# Patient Record
Sex: Male | Born: 1940 | Race: White | Hispanic: No | Smoking: Never smoker
Health system: Southern US, Community
[De-identification: ages and names within clinical notes are randomized; demographics above are authoritative.]

## PROBLEM LIST (undated history)

## (undated) DIAGNOSIS — E663 Overweight: Secondary | ICD-10-CM

## (undated) DIAGNOSIS — M109 Gout, unspecified: Secondary | ICD-10-CM

## (undated) DIAGNOSIS — E538 Deficiency of other specified B group vitamins: Secondary | ICD-10-CM

## (undated) DIAGNOSIS — E1140 Type 2 diabetes mellitus with diabetic neuropathy, unspecified: Secondary | ICD-10-CM

## (undated) DIAGNOSIS — C801 Malignant (primary) neoplasm, unspecified: Secondary | ICD-10-CM

## (undated) DIAGNOSIS — E785 Hyperlipidemia, unspecified: Secondary | ICD-10-CM

## (undated) DIAGNOSIS — T8453XA Infection and inflammatory reaction due to internal right knee prosthesis, initial encounter: Secondary | ICD-10-CM

## (undated) DIAGNOSIS — T884XXA Failed or difficult intubation, initial encounter: Secondary | ICD-10-CM

## (undated) DIAGNOSIS — J45909 Unspecified asthma, uncomplicated: Secondary | ICD-10-CM

## (undated) DIAGNOSIS — I10 Essential (primary) hypertension: Secondary | ICD-10-CM

## (undated) DIAGNOSIS — Z96653 Presence of artificial knee joint, bilateral: Secondary | ICD-10-CM

## (undated) DIAGNOSIS — I509 Heart failure, unspecified: Secondary | ICD-10-CM

## (undated) DIAGNOSIS — D4959 Neoplasm of unspecified behavior of other genitourinary organ: Secondary | ICD-10-CM

## (undated) DIAGNOSIS — L111 Transient acantholytic dermatosis [Grover]: Secondary | ICD-10-CM

## (undated) DIAGNOSIS — Z7901 Long term (current) use of anticoagulants: Secondary | ICD-10-CM

## (undated) DIAGNOSIS — I5032 Chronic diastolic (congestive) heart failure: Secondary | ICD-10-CM

## (undated) DIAGNOSIS — I499 Cardiac arrhythmia, unspecified: Secondary | ICD-10-CM

## (undated) DIAGNOSIS — Z79899 Other long term (current) drug therapy: Secondary | ICD-10-CM

## (undated) DIAGNOSIS — G4700 Insomnia, unspecified: Secondary | ICD-10-CM

## (undated) DIAGNOSIS — I4819 Other persistent atrial fibrillation: Secondary | ICD-10-CM

## (undated) DIAGNOSIS — R011 Cardiac murmur, unspecified: Secondary | ICD-10-CM

## (undated) DIAGNOSIS — E1169 Type 2 diabetes mellitus with other specified complication: Secondary | ICD-10-CM

## (undated) DIAGNOSIS — I4891 Unspecified atrial fibrillation: Secondary | ICD-10-CM

## (undated) DIAGNOSIS — C44311 Basal cell carcinoma of skin of nose: Secondary | ICD-10-CM

## (undated) DIAGNOSIS — K635 Polyp of colon: Secondary | ICD-10-CM

## (undated) DIAGNOSIS — B356 Tinea cruris: Secondary | ICD-10-CM

## (undated) DIAGNOSIS — E1142 Type 2 diabetes mellitus with diabetic polyneuropathy: Secondary | ICD-10-CM

## (undated) DIAGNOSIS — E7800 Pure hypercholesterolemia, unspecified: Secondary | ICD-10-CM

## (undated) DIAGNOSIS — E119 Type 2 diabetes mellitus without complications: Secondary | ICD-10-CM

## (undated) HISTORY — DX: Unspecified asthma, uncomplicated: J45.909

## (undated) HISTORY — DX: Gout, unspecified: M10.9

## (undated) HISTORY — DX: Other long term (current) drug therapy: Z79.899

## (undated) HISTORY — DX: Basal cell carcinoma of skin of nose: C44.311

## (undated) HISTORY — DX: Deficiency of other specified B group vitamins: E53.8

## (undated) HISTORY — PX: APPENDECTOMY: SHX54

## (undated) HISTORY — DX: Heart failure, unspecified: I50.9

## (undated) HISTORY — DX: Cardiac murmur, unspecified: R01.1

## (undated) HISTORY — PX: CARDIAC ELECTROPHYSIOLOGY STUDY AND ABLATION: SHX1294

## (undated) HISTORY — DX: Hyperlipidemia, unspecified: E78.5

## (undated) HISTORY — DX: Malignant (primary) neoplasm, unspecified: C80.1

## (undated) HISTORY — DX: Chronic diastolic (congestive) heart failure: I50.32

## (undated) HISTORY — DX: Type 2 diabetes mellitus with diabetic neuropathy, unspecified: E11.40

## (undated) HISTORY — DX: Presence of artificial knee joint, bilateral: Z96.653

## (undated) HISTORY — DX: Overweight: E66.3

## (undated) HISTORY — DX: Essential (primary) hypertension: I10

## (undated) HISTORY — DX: Polyp of colon: K63.5

## (undated) HISTORY — DX: Pure hypercholesterolemia, unspecified: E78.00

## (undated) HISTORY — DX: Long term (current) use of anticoagulants: Z79.01

## (undated) HISTORY — DX: Type 2 diabetes mellitus without complications: E11.9

## (undated) HISTORY — DX: Insomnia, unspecified: G47.00

## (undated) HISTORY — PX: CHOLECYSTECTOMY: SHX55

## (undated) HISTORY — DX: Tinea cruris: B35.6

## (undated) HISTORY — DX: Infection and inflammatory reaction due to internal right knee prosthesis, initial encounter: T84.53XA

## (undated) HISTORY — PX: CARDIOVERSION: EP1203

## (undated) HISTORY — DX: Unspecified atrial fibrillation: I48.91

## (undated) HISTORY — DX: Type 2 diabetes mellitus with diabetic polyneuropathy: E11.42

## (undated) HISTORY — PX: REPLACEMENT TOTAL KNEE BILATERAL: SUR1225

## (undated) HISTORY — DX: Transient acantholytic dermatosis (grover): L11.1

## (undated) HISTORY — DX: Neoplasm of unspecified behavior of other genitourinary organ: D49.59

## (undated) HISTORY — DX: Other persistent atrial fibrillation: I48.19

## (undated) HISTORY — DX: Type 2 diabetes mellitus with other specified complication: E11.69

## (undated) HISTORY — PX: PROSTATECTOMY: SHX69

## (undated) NOTE — Procedures (Signed)
 Formatting of this note might be different from the original.  JOINT ASPIRATION PROCEDURE NOTE  Date: 09/03/2017 14:24 Patient Name: Charles Lloyd.      Date of Birth: [birth year] MRN: [id number]      PCP: Donnice SAUNDERS. Messier, MD  Pre-operative Diagnosis: right knee swelling  Post-operative Diagnosis: hemarthrosis    Indications: Diagnostic  Anesthesia: Lidocaine  1% without epinephrine    Procedure Details   Verbal consent was obtained for the procedure and risks/benefits were discussed (bleeding, infection, pain). The point of maximum fluctuance was identified at the lateral right knee joint and the area was marked. The skin prepped with betadine and lidocaine  without epi was used to numb skin.  2mL of lidocaine  were used to raise a small wheal.     A needle was advanced into the right knee joint without difficulty and a total of 30mL of grossly bloody fluid was removed and sent to the lab for analysis.  No corticosteroids were given.   Complications:  None; patient tolerated the procedure well. Instructed to contact medical advice if has signs of infection - erythema, more swelling, fevers, or red line on skin.  Electronically signed by:  Lamar Rolan Blunt, MD, 09/03/2017, 14:24 Attending: Dr. Dorise Sax   Cosigned by Garnette KATHEE Sax, MD at 09/03/2017  7:36 PM EDT Electronically signed by Lamar Rolan Blunt, MD at 09/03/2017  2:29 PM EDT Electronically signed by Garnette KATHEE Sax, MD at 09/03/2017  7:36 PM EDT  Associated attestation - Sax GORMAN Dorise, MD - 09/03/2017  7:36 PM EDT Formatting of this note might be different from the original. I was present for the entire procedure and assisted the resident, Dr. Blunt in the arthrocentesis of the patient's right knee.  Please refer to the completed procedure note by Dr. Blunt.

## (undated) NOTE — Progress Notes (Signed)
 Formatting of this note is different from the original. Images from the original note were not included.     [facility] OFF[facility] CONSULT                                                               Patient Name:  Charles Lloyd      DOB:  [birth year]  MEDICAL RECORD NUMBER [id number]    Contact Serial Number:  [id number]  PCP: Donnice SAUNDERS. Messier, MD                          Referring Pysician:  Messier  Reason for Consult:  Fobt+, IDA  SUBJECTIVE  History of Present Illness:  Ashford Clouse is a 5 y.o. male whom I was asked to see for ida, fobt+  He has diabetes, not on meds at present. Takes ibuprofen 6/day. No Nexium. No melena (nowOn iron for a few weeks. ) No abdominal pain, diarrhea, constipation.  No gerd. On Eliquis  bid. (off Coumadin).  No dysphagia.  No fh colon cancer.  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 Diabetes mellitus type II, controlled 01/06/2010  ? Tinea cruris 01/06/2010  ? Colon polyps 01/06/2010  ? Neoplasm of prostate, malignant 01/06/2010  ? Allergic rhinitis 01/06/2010  ? Hypercholesterolemia 01/06/2010  ? Hypertension 01/06/2010  ? Atrial fibrillation 01/04/2010  ? Cancer     Prostate  ? History of recent travel     [place]   Surgical History: Past Surgical History  Procedure Laterality Date  ? Prostatectomy  2001   Social History: History   Social History  ? Marital Status: Married    Spouse Name: N/A    Number of Children: N/A  ? Years of Education: N/A   Social History Main Topics  ? Smoking status: Never Smoker   ? Smokeless tobacco: None  ? Alcohol  Use: 14.0 oz/week    28 Not specified per week  ? Drug Use: No  ? Sexual Activity: None     Comment: Married   Other  Topics Concern  ? None   Social History Narrative   Marital Status - Married   Children - None listed   Occupation - No information given   Regular Exercise - Yes, walking   Family History: Family History  Problem Relation Age of Onset  ? Heart disease Other     family hx of late-onset cardiovascular disease  ? Cancer - Other Neg Hx     no family hx of heritable cancers  ? Colon cancer Neg Hx   ? Diabetes Mother    Current Medications: Current Outpatient Prescriptions  Medication Sig Dispense Refill  ? ALPRAZolam (XANAX) 0.5 MG tablet TAKE 1/2 TABLET BY MOUTH TWICE A DAY AS NEEDED FOR SLEEP OR ANXIETY 30 tablet 2  ? amlodipine -benazepril (LOTREL) 5-20 mg per capsule TAKE 1 CAPSULE TWICE A DAY 180 capsule 3  ? apixaban  (ELIQUIS ) 5 mg Tab tablet Take by mouth 2 (two) times daily.    ?  atorvastatin  (LIPITOR) 20 MG tablet TAKE 1 TABLET DAILY WITH EVENING MEAL 90 tablet 0  ? betamethasone dipropionate (DIPROLENE) 0.05 % cream Apply topically as needed. TO RASH AS DIRECTED    ? blood sugar diagnostic (GLUCOSE BLOOD) Strp Test 2x daily Dx: type 2 diabetes, uncontrolled fluctuating blood sugars 60 strip 5  ? blood-glucose meter kit Use as instructed 1 each 0  ? cetirizine (ZYRTEC) 10 MG tablet Take 1 tablet (10 mg total) by mouth daily as needed for Allergies (rash). 30 tablet 0  ? hydrochlorothiazide  (ORETIC ) 12.5 MG tablet TAKE 1 TABLET DAILY 90 tablet 0  ? mometasone (NASONEX) 50 mcg/Actuation nasal spray Use as directed twice daily.    ? montelukast  (SINGULAIR ) 10 mg tablet Take 10 mg by mouth nightly.    ? oxyCODONE -acetaminophen  (PERCOCET) 5-325 mg per tablet Take 1 tablet by mouth every 4 (four) hours as needed for Pain. 60 tablet 0  ? [DISCONTINUED] ASPIRIN ORAL Take 81 mg by mouth. 2 x weekly      No current facility-administered medications for this visit.   Allergies: No Known Allergies   Review of Systems:  Gastroenterology review of systems: As per history of present  illness. General review of systems is positive for feet swelling, joint pain,  The remainder of the 14 point review of systems is negative.  OBJECTIVE  Physical Exam: BP 151/83 mmHg  Pulse 87  Temp(Src) 97.6 F (36.4 C) (Oral)  Ht 5' 10 (1.778 m)  Wt 195 lb 3.2 oz (88.542 kg)  BMI 28.01 kg/m2 Wt Readings from Last 3 Encounters:  04/02/14 195 lb 3.2 oz (88.542 kg)  03/18/14 190 lb (86.183 kg)  02/01/14 175 lb (79.379 kg)   General Appearance:    Alert, cooperative, no distress, appears stated age  Head:    Normocephalic, without obvious abnormality, atraumatic  Eyes:    PERRL, conjunctiva/corneas clear, EOM's intact  Ears:    Normal external ear canals, both ears  Nose:   Septum midline, mucosa normal, no drainage or sinus tenderness  Throat:   Lips, mucosa, and tongue norma  Neck:   Supple, symmetrical, trachea midline, no adenopathy;    thyroid :  no enlargement/tenderness/nodules; no carotid   bruit or JVD  Back:     Symmetric, no curvature, ROM normal  Lungs:     Clear to auscultation bilaterally, respirations unlabored  Chest Wall:    No tenderness or deformity   Heart:    Regular rate and rhythm, S1 and S2 normal, no murmur  Abdomen:     Inspection:  No hernias   Soft, non-tender, bowel sounds active all four quadrants,    no masses, no organomegaly   Extremities:   Extremities normal, atraumatic, no cyanosis or edema  Pulses:   2+ and symmetric all extremities  Skin:   no rashes or lesions  Lymph nodes:   Cervical, supraclavicular, and axillary nodes normal  Neurologic:   Speech-normal, normal strength, sensation and reflexes throughout, gait-normal  Neuropsychiatric:   Well groomed, oriented X 3 with appropriate mood and affect     Labs and diagnostic tests:   I reviewed available electronic and paper records. This included current Epic and pre-Epic Hospital records as well as in the available electronic outpatient records Reviewed independently the images and  reports of available abdominal imaging   Recent Results (from the past 2016 hour(s))  Comprehensive Metabolic Profile(CMP)   Collection Time: 01/25/14  8:56 AM  Result Value Ref Range   Sodium 134 132 -  146 mEq/L   Potassium 4.3 3.5 - 5.5 mmol/L   Chloride 97 (L) 99 - 109 mmol/L   CO2 30 21 - 31 mEq/L   Anion Gap 7    UREA NITROGEN (BUN) 11 9 - 23 mg/dL   Creatinine 0.7 0.5 - 1.3 mg/dL   Glucose 877 (H) 70 - 106 mg/dL   Osmolality Calc 730 (L) 280 - 301 mOSm/kg   Calcium  9.3 8.7 - 10.4 mg/dL   eGFR - Other 878.91 fo/fpw/8.26   eGFR - African American 146.50 ml/min/1.73   Albumin , BCG Method 3.9 3.2 - 4.8 g/dL   Bilirubin, Total 1.0 0.3 - 1.2 mg/dL   Alkaline Phosphatase 103 45 - 158 U/L   AST/SGOT 31.0 0.0 - 47.0 U/L   ALT 31 10 - 59 U/L   Protein, Total 6.7 5.7 - 8.2 g/dL   A/G RATIO 8.60   Lipid Panel   Collection Time: 01/25/14  8:56 AM  Result Value Ref Range   Cholesterol 128 0 - 200 mg/dL   Triglycerides 61 0 - 249 mg/dL   HDL 43 40 - 92 mg/dl   Chol/HDL Ratio 3.0 (L) 4.0 - 6.7   LDL Calculated 73 50 - 130 mg/dL   VLDL Cholesterol (Calc) 12 mg/dl  LDL, Direct   Collection Time: 01/25/14  8:56 AM  Result Value Ref Range   LDL, Direct Assay 77 0 - 130 mg/dL  Hemoglobin J8r   Collection Time: 01/25/14  8:56 AM  Result Value Ref Range   Hemoglobin A1c 6.1 3.9 - 6.2 %   Mean Glucose 117.13   Microalbumin/Crea Ratio, Urine   Collection Time: 01/25/14  8:56 AM  Result Value Ref Range   Microalbumin, Urine 1.5 0.2 - 1.9 mg/dL   Creatinine, Urine 869.5 mg/dL   Microalbumin/Creatinine Ratio 11.5 0.1 - 30.0  Urinalysis (UA) W/ Microscopic   Collection Time: 01/25/14  8:56 AM  Result Value Ref Range   Color, UA Yellow    Appearance Clear    Glucose, UA Negative    Bilirubin, UA Negative    Ketones, UA Negative    Specific Gravity, Urine 1.017 1.005 - 1.03   pH, Urine 5 5.00 - 8.00   Protein, UA Negative Negative   Urobilinogen, UA 2.0 (A) 0.2 - 1.0   Nitrite,  UA Negative Negative   Blood, UA Negative    Leukocytes, UA Negative    WBC, UA <1 Males 0-2/   RBC, UA 1 Females 0-   Bacteria, UA none    Mucus Rare    Amorphous, UA none    Squam Epithel, UA none   CBC with Differential   Collection Time: 01/25/14  8:56 AM  Result Value Ref Range   Hemoglobin 12.1 (L) 14.0 - 18.0 g/dL   Hematocrit 63.4 (L) 59.9 - 52.0 %   Erythrocytes 4.61 4.60 - 6.10 M/uL   MCV 79.2 (L) 80.0 - 98.0 fL   MCH 26.2 (L) 27.5 - 33.0 pg   MCHC 33.2 31.0 - 36.0 g/dL   RDW 84.29 88.49 - 83.9 %   Platelets 235.00 150.00 - 450 K/uL   MPV 9.5 8.0 - 13.0 fL   WBC 3.91 (L) 4.50 - 10.80 K/uL   Neutrophils 55.0 37.0 - 80.0 %   Neutrophils, Absolute 2.15 1.60 - 7.70 #   Lymphocytes 20.7 20.0 - 45.0 %   Lymphocytes, Absolute 0.8 (L) 1.0 - 4.5 #   Monocytes 15.3 (H) 1.0 - 13.0 %   Monocytes, Absolute  0.6 0.1 - 1.0 #   Eosinophils 8 (H) 0 - 6 %   Eosinophils, Absolute 0.3 0.0 - 0.7 #   Basophils 1 0 - 2 %   Basophils, Absolute 0.0 0.0 - 0.3 #   Immature Granulocytes, Absolute 0.01 #   Immature Granulocytes 0.30 %   NRBC 0.00 0.00 - 0.01 #   nRBC, Absolute 0.00 0.00 - 0.20 %  Fecal Occult Blood Immuno Test   Collection Time: 02/02/14 12:12 PM  Result Value Ref Range   iFOB Positive (A) Negative  Bone Density & IVA Imaging (First Available Appt)   Collection Time: 03/15/14 12:00 AM  Result Value Ref Range   Z Score Hip     T Score Hip    CBC with Differential   Collection Time: 03/15/14  9:43 AM  Result Value Ref Range   Hemoglobin 12.9 (L) 14.0 - 18.0 g/dL   Hematocrit 61.8 (L) 59.9 - 52.0 %   Erythrocytes 4.56 (L) 4.60 - 6.10 M/uL   MCV 83.6 80.0 - 98.0 fL   MCH 28.3 27.5 - 33.0 pg   MCHC 33.9 31.0 - 36.0 g/dL   RDW 84.09 88.49 - 83.9 %   Platelets 241.00 150.00 - 450 K/uL   MPV 9.8 8.0 - 13.0 fL   WBC 6.25 4.50 - 10.80 K/uL   Neutrophils 52.7 37.0 - 80.0 %   Neutrophils, Absolute 3.30 1.60 - 7.70 #   Lymphocytes 30.4 20.0 - 45.0 %   Lymphocytes,  Absolute 1.9 1.0 - 4.5 #   Monocytes 12.5 1.0 - 13.0 %   Monocytes, Absolute 0.8 0.1 - 1.0 #   Eosinophils 3 0 - 6 %   Eosinophils, Absolute 0.2 0.0 - 0.7 #   Basophils 1 0 - 2 %   Basophils, Absolute 0.1 0.0 - 0.3 #   Immature Granulocytes, Absolute 0.05 #   Immature Granulocytes 0.80 %   NRBC 0.00 0.00 - 0.01 #   nRBC, Absolute 0.00 0.00 - 0.20 %  Iron & Total Iron Binding Capacity(TIBC)   Collection Time: 03/15/14  9:43 AM  Result Value Ref Range   Iron 55 50 - 175 ug/dL   Iron Saturation 14 (L) 20 - 55 %   Iron Binding Capacity 405 250 - 450 ug/dL    Results for orders placed in visit on 09/24/13  CT Abdomen with/without contrast   Narrative CT SCAN OF THE ABDOMEN WITH AND WITHOUT IV CONTRAST:  COMPARISON: None.   TECHNIQUE: Helically acquired images were obtained from the lung bases through the iliac crests with and without IV contrast administration.  CONTRAST:  100 mL Isovue  300, no reaction.   FINDINGS:  Lung bases clear. Numerous subcentimeter circumscribed hypodense lesions scattered throughout both lobes of the liver. The largest in the lateral segment of left lobe measuring 1.3 x 1.0 cm. None of these demonstrate enhancement. Findings consistent with benign intrahepatic cyst. 7 mm left midpole renal cyst. Solid organs otherwise unremarkable. No free fluid or adenopathy. Cholelithiasis. Mild gallbladder wall thickening. No lytic or blastic lesions. There are a few enlarged porta hepatic lymph nodes. None are pathologic by size criteria.   Impression IMPRESSION:   MULTIPLE BENIGN-APPEARING INTRAHEPATIC CYSTS.  CHOLELITHIASIS WITH GALLBLADDER WALL THICKENING. RECOMMEND RIGHT UPPER QUADRANT ULTRASOUND FOR FURTHER CHARACTERIZATION.  FEW ENLARGED PORTA HEPATIC LYMPH NODES. NON PATHOLOGIC BY SIZE CRITERIA.  Edited by: Gustav Pyles Date: 09/24/2013 10:59 AM  Dictated By: Prentice JINNY Hurst, MD 09/24/2013 10:52 AM  Electronically Signed by:  Prentice JINNY Hurst, MD  09/24/2013 11:16 AM   N Results for orders placed in visit on 11/25/13  Ultrasound Abdomen RUQ (liver, gall bladder)   Narrative RIGHT UPPER QUADRANT ULTRASOUND:  COMPARISON:  CT of abdomen 09/24/2013  Gallstones.  FINDINGS:  Visualized portions aorta, pancreas, inferior vena cava unremarkable other than atherosclerotic change abdominal aorta.  19 cm enlarged echogenic liver consistent with hepatic steatosis or fibrosis. Hypoechoic areas in the liver RIGHT and LEFT lobes, largest approximately 1.4 likely represent benign cysts.  Multiple gallstones. 4 mm thickened gallbladder wall. No direct tenderness.  5 mm common bile duct without dilated hepatic ducts.  12.5 cm RIGHT kidney.    Impression IMPRESSION:  HEPATOMEGALY WITH UNDERLYING HEPATOCELLULAR DISEASE.  GALLSTONES WITH THICKENED GALLBLADDER WALL.  Dictated By: Norleen VEAR Taft Mickey, MD 11/25/2013 9:49 AM  Electronically Signed by: Norleen VEAR Taft Mickey, MD 11/25/2013 9:51 AM   Encounter Diagnoses  Name Primary?  ? Fecal occult blood test positive Yes  ? Colon polyps   ? Iron deficiency anemia due to chronic blood loss   ? Hematest positive stools   ? Neoplasm of prostate, malignant   ? Atrial fibrillation with controlled ventricular response    ASSESSMENT & PLAN   1. Fecal occult blood test positive   2. Colon polyps   3. Iron deficiency anemia due to chronic blood loss   4. Hematest positive stools   5. Neoplasm of prostate, malignant   6. Atrial fibrillation with controlled ventricular response   patient with IDA, fobt+, hx polyps on Eliquis  for A-fib and hx of prostate radiation Hx polyps overdue for screening, advanced adenoma 2007  Orders Placed This Encounter  Procedures  ? EGD  ? AMB Colonoscopy Diagnostic  plan Stop nsaids (ibuprofen) Egd/colon-2 day moviprep , last dose eliquis  morning before  I recommend EGD with possible biopsy, sclerotherapy, bicap coagulation, dilatation.  Risks, benefits and  alternatives of EGD were discussed with this patient.   Specifically discussed were risks of bleeding, infection, perforation, medication reaction, aspiration. The patient understands and agrees to the procedure.I recommend colonoscopy with possible biopsy, polypectomy, sclerotherapy, bicap coagulation .  Risks, benefits and alternatives of the colonoscopy were discussed with this patient.  This includes risk of bleeding, infection, medication reaction, perforation, small miss rate.  Risks of the prep were also discussed including risk of dehydration, renal insufficiency and electrolyte abnormalities, such as hyponatremia and hypokalemia.  The patient indicates understanding and agrees to the procedure.  Gilmore Fam, MD [facility]   04/02/2014 10:59  No future appointments.   cc: Donnice SAUNDERS. Messier, MD   Portions of this note may be dictated using Dragon natural speaking voice recognition software. Variances in spelling and vocabulary are possible and unintentional. Please notify dino if any discrepancies are noted  [id number] minutes   Electronically signed by Gilmore VEAR Fam, MD at 04/02/2014 11:02 AM EST

## (undated) NOTE — ED Provider Notes (Signed)
 Formatting of this note is different from the original. eMERGENCY dEPARTMENT eNCOUnter    CHIEF COMPLAINT   Chief Complaint  Patient presents with  ? Leg Pain    pain to right knee, calf onset yesterday. hx of infection to knee s/p surgery in February. swollen, mild redness to lower calf   HPI   Charles Lloyd. is a 59 y.o. male who presents to the emergency department with complaints of right knee and calf pain that started 4 days ago.  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  They removed hardware and he was on antibiotics through his IV for 6 weeks.  He is currently still on oral antibiotics and is followed by infectious disease.  He states that he noticed this morning that he had some swelling and was not able to put any weight on his leg.  He is concerned that his infection may be back.  He denies any fevers or chills.  He denies any history of blood clots.  No chest pain or shortness of breath.  Patient is on Eliquis  currently for AFib.  He states that he called his infectious disease doctor and they told him to come into the ER for evaluation as they are also concerned about possible infection.  No injuries to his knee or leg.  PAST MEDICAL HISTORY   Past Medical History:  Diagnosis Date  ? Anemia   ? Arthritis    KNEES  ? Atrial fibrillation (HCC) 01/04/2010  ? CHF (congestive heart failure) (HCC)    EF40% on echo 2015  ? Chronic anticoagulation   ? Complication of anesthesia    DIFFICULT INTUB. PER PT. IN 2001, NO CARD GIVEN TO PT., STATES NO PROBLEMS WITH ANESTH. SINCE THEN  ? 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 Hypercholesterolemia 01/06/2010  ? Hypertension 01/06/2010  ? Neuropathy    FEET  ? Prostate cancer [facility]) 2001   surgery then RT  ? T2DM (type 2 diabetes mellitus) (HCC)    SURGICAL HISTORY   Past Surgical History:   Procedure Laterality Date  ? CARDIAC ELECTROPHYSIOLOGY MAPPING AND ABLATION  2015   Dr. Britta at [facility]  ? CARDIOVERSION  2017  ? CARPAL TUNNEL RELEASE Right 07/02/2016  ? CATARACT EXTRACTION Left   ? COLONOSCOPY W/ POLYPECTOMY  03/2014  ? KNEE SURGERY     x2  ? LAPAROSCOPIC CHOLECYSTECTOMY  2016  ? PROSTATECTOMY  2001  ? TOTAL KNEE ARTHROPLASTY Left 2017   MEDICATIONS   No current facility-administered medications for this encounter.    Current Outpatient Medications  Medication Sig Dispense Refill  ? ACCU-CHEK AVIVA PLUS TEST STRP Strp Use to test blood sugars once daily as directed Dx:E11.65 100 each 1  ? atorvastatin  (LIPITOR) 20 MG tablet TAKE 1 TABLET DAILY WITH EVENING MEAL 90 tablet 0  ? cyanocobalamin  (VITAMIN B-12) 1000 MCG tablet Take 1 tablet (1,000 mcg total) by mouth daily. 30 tablet 11  ? diltiazem  (CARDIZEM  CD) 360 MG 24 hr capsule Take 1 capsule (360 mg total) by mouth daily. 30 capsule 0  ? ELIQUIS  5 mg Tab tablet TAKE 1 TABLET TWICE A DAY 180 tablet 0  ? furosemide  (LASIX ) 20 MG tablet TAKE 1 TABLET DAILY 90 tablet 1  ? gabapentin  (NEURONTIN ) 300 MG capsule Take 600 mg by mouth nightly.    ?  KLOR-CON  M20 20 mEq tablet TAKE 1 TABLET (20 MEQ TOTAL) BY MOUTH DAILY. 90 tablet 1  ? lisinopril  (PRINIVIL ,ZESTRIL ) 10 MG tablet Take 1 tablet (10 mg total) by mouth daily.    ? metFORMIN  (GLUCOPHAGE ) 500 MG tablet Take 1 tablet (500 mg total) by mouth 2 (two) times daily with meals. 180 tablet 0  ? metoprolol  tartrate (LOPRESSOR ) 50 MG tablet Take 2 tablets (100 mg total) by mouth 2 (two) times daily. 180 tablet 3  ? mometasone (NASONEX) 50 mcg/Actuation nasal spray Use as directed twice daily.    ? montelukast  (SINGULAIR ) 10 mg tablet Take 10 mg by mouth nightly.     ALLERGIES   No Known Allergies  FAMILY HISTORY   Family History  Problem Relation Age of Onset  ? Diabetes Mother   ? Lung cancer Mother   ? Lymphoma Father   ? Heart disease Other        family hx of  late-onset cardiovascular disease  ? Cancer - Other Neg Hx        no family hx of heritable cancers  ? Colon cancer Neg Hx    SOCIAL HISTORY   Social History   Socioeconomic History  ? Marital status: Married    Spouse name: None  ? Number of children: None  ? Years of education: None  ? Highest education level: None  Occupational History  ? None  Social Needs  ? Financial resource strain: None  ? Food insecurity:    Worry: None    Inability: None  ? Transportation needs:    Medical: None    Non-medical: None  Tobacco Use  ? Smoking status: Never Smoker  ? Smokeless tobacco: Never Used  Substance and Sexual Activity  ? Alcohol  use: Yes    Comment: 12 weekly  ? Drug use: No  ? Sexual activity: None    Comment: Married  Lifestyle  ? Physical activity:    Days per week: None    Minutes per session: None  ? Stress: None  Relationships  ? Social connections:    Talks on phone: None    Gets together: None    Attends religious service: None    Active member of club or organization: None    Attends meetings of clubs or organizations: None    Relationship status: None  ? Intimate partner violence:    Fear of current or ex partner: None    Emotionally abused: None    Physically abused: None    Forced sexual activity: None  Other Topics Concern  ? None  Social History Narrative   Marital Status - Married   Children - None listed   Occupation - No information given   Regular Exercise - Yes, walking   Originally from [place], [place]   REVIEW OF SYSTEMS   Constitutional:  Denies fever, chills.  Denies generalized weakness.  Eyes:  Denies photophobia.  Denies blurred vision.  HENT:  Denies sore throat.  Denies ear pain.  Neck: Denies neck pain or stiffness. Respiratory:  Denies cough.  Denies shortness of breath.  Denies hemoptysis. Cardiovascular:  Denies chest pain.  Denies palpitations.  GI:  Denies abdominal pain.  Denies nausea.  Denies vomiting.  Denies  diarrhea.  Musculoskeletal:  See HPI Skin:  Denies rash.  Neurologic:  Denies focal weakness or sensory changes.  Denies headache. Lymphatic:  Denies swollen glands.   See HPI for further details.   PHYSICAL EXAM   VITAL SIGNS ED Triage  Vitals [09/03/17 1015]  Temp 97.7 F (36.5 C)  Temp src   Pulse 86  Heart Rate Source   Resp 18  BP 120/76  BP Location   BP Method   SpO2 100 %  O2 Device / Respiratory Support   O2 Flow Rate (L/min)   Pain Assessment 0-10  Pain Score 7   BP 124/8   Pulse 82   Temp 97.7 F (36.5 C)   Resp 16   SpO2 100%  Constitutional:  Well developed, well nourished, no acute distress, non-toxic appearance. Eyes:  PERRL, EOMI, conjunctiva normal, no discharge.  HENT: Normocephalic, atraumatic, nose normal.  Ears clear bilaterally, normal TM?s.  Normal oropharynx, no erythema, no tonsilar swelling, plaques or exudates. Neck:  Normal inspection, normal range of motion, supple, no meningismus, no lymphadenopathy. Respiratory:  Normal breath sounds.  No respiratory distress.  No wheezing.  No chest tenderness. Cardiovascular:  Normal heart rate, normal rhythm.  No murmurs, no rubs, no gallops. Abdomen: Soft, nontender, nondistended.  Normoactive bowel sounds present. Extremities:  Intact distal pulses, no edema, no cyanosis. Good range of motion in all major joints. No tenderness to palpation or major deformities noted.  Swelling noted to the right knee Back: No midline or CVA tenderness.  Skin:  Warm, dry, no erythema, no rash.  Neurologic:  Awake and alert, gross normal cognition.  No focal deficits noted.  CN II-XII grossly intact.  GCS=15.  RADIOLOGY   X-ray Knee Right Ap And Lat  Result Date: 09/03/2017 TWO VIEW RIGHT KNEE: COMPARISON: No comparison. FINDINGS: Status post right knee total arthroplasty. No hardware failure. No periprosthetic fractures. Joint spaces preserved. Prepatellar soft tissue swelling. Joint effusion. Diffuse vascular  calcification.   IMPRESSION: LARGE JOINT EFFUSION WITHOUT RADIOGRAPHIC FINDINGS OF AN ACUTE OSSEOUS ABNORMALITY. Dictated By: Debby RAMAN Dziedzic MD 09/03/2017 13:09  Electronically Signed by: Debby RAMAN Dziedzic MD 09/03/2017 13:10  Ultrasound Duplex Leg Right Venous  Result Date: 09/03/2017 VENOUS DUPLEX RIGHT LOWER EXTREMITY: COMPARISON: Prior studies HISTORY: Pain FINDINGS: Longitudinal and transverse images of the deep venous system were performed utilizing real-time ultrasound, color Doppler and duplex Doppler. There is normal compressibility, phasicity, augmentation and color flow.   IMPRESSION: NO EVIDENCE OF RIGHT LOWER EXTREMITY DEEP VENOUS THROMBOSIS.    Dictated By: Redell MARLA Holstein, MD 09/03/2017 11:52  Electronically Signed by: Redell MARLA Holstein, MD 09/03/2017 11:52   LABS Results for orders placed or performed during the hospital encounter of 09/03/17  CBC with Differential  Result Value Ref Range   WBC Count 5.6 4.0 - 10.0 K/uL   RBC Count 4.55 (L) 4.63 - 6.08 M/uL   Hemoglobin 10.6 (L) 13.7 - 17.5 g/dL   Hematocrit 65.8 (L) 59.8 - 51.0 %   MCV 74.9 (L) 79.0 - 92.2 fL   MCH 23.3 (L) 25.6 - 32.2 pg   MCHC 31.1 (L) 32.2 - 36.5 g/dL   RDW 80.5 (H) 88.3 - 85.5 %   Platelets 310 163 - 369 K/uL   MPV 9.7 9.4 - 12.4 fL   Abs NRBC 0.01 0.00 - 0.01 K/uL   Neutrophils 49.6 %   Lymphs 25.9 %   Monocytes 14.2 %   Eosinophils 8.5 %   Basophils 1.4 %   Immature Grans 0.4 %   Nucleated RBC 0.0 0.0 - 0.2 per 100 WBC's   Abs Neutrophils 2.75 1.60 - 6.10 K/uL   Abs Lymphs 1.44 1.20 - 3.70 K/uL   Abs Monos 0.79 0.20 - 0.80 K/uL  Abs EOS 0.47 0.00 - 0.50 K/uL   Abs Baso 0.08 0.00 - 0.10 K/uL   Abs Immature Grans 0.02 0.00 - 0.03 K/uL   ANC (Auto) 2.75 K/uL   Diff Type AUTO   Basic Metabolic Profile (BMP): Glucose, BUN, CO2-, Na+, K+, Cl-, Ca+, SCr, Anion Gap  Result Value Ref Range   Sodium 139 136 - 145 mmol/L   Potassium 3.9 3.5 - 5.1 mmol/L   Chloride 102 98 - 107 mmol/L   CO2 26 21  - 32 mmol/L   Anion Gap 11    Creatinine 1.09 0.70 - 1.30 mg/dL   GFR, Estimated >39 fO/fpw/8.26f7   BUN 17 7 - 18 mg/dL   Glucose 838 (H) 74 - 106 mg/dL   Calcium  9.3 8.5 - 10.1 mg/dL  C Reactive Protein  Result Value Ref Range   CRP 5.3 (H) 0.0 - 0.9 mg/dL  Morphology  Result Value Ref Range   RBC Morphology Present (A) Normal   Microcytic Moderate    Burr Cells Slight    Ovalocytes Slight    Teardrop Slight    PROCEDURES   Please refer to Dr. Donita note for details  ED COURSE & MEDICAL DECISION MAKING   Patient presents to the emergency department with right knee pain and swelling.  Patient is concerned about infection he.  He has afebrile.  CBC is showing a normal white blood cell count.  CRP is elevated at 0.3.  Sed rate is currently pending.  The option duplex is negative for DVT.  I called and spoke with patient's infectious disease doctor who recommended x-ray or CT to look for effusion.  X-ray was obtained showing a large effusion.  I called and spoke with his orthopedic doctor as the patient states he wanted a tap of the knee and to have the fluid cultured and the cell count.  Orthopedic doctor recommended that we tapped in the for and send the fluid off and have him follow up in his office tomorrow morning.  Patient is already on antibiotics.  The do not recommend changing his antibiotics at this time.  Patient is very well-appearing and safe for discharge at this time.  Follow up with Orthopedics 1st thing tomorrow.  Patient is currently stable and not in any acute distress and will be discharged home in good condition.  Patient is instructed to return to the emergency department for any other problems.  FINAL IMPRESSION   Final diagnoses:  Effusion of right knee   Portions of this note may be dictated using voice recognition software.  Variances in spelling and vocabulary are possible and unintentional.  Not all errors are caught/corrected.  Please notify the dino if  any discrepancies are noted or if the meaning of any statement is not clear.   Jordan A. Jenita, PA-C 09/03/17 1559   Garnette KATHEE Sax, MD 09/03/17 1937  Cosigned by Garnette KATHEE Sax, MD at 09/03/2017  7:37 PM EDT Electronically signed by Jordan A. Jenita, PA-C at 09/03/2017  3:59 PM EDT Electronically signed by Garnette KATHEE Sax, MD at 09/03/2017  7:37 PM EDT  Associated attestation - Sax GORMAN Pride, MD - 09/03/2017  7:37 PM EDT Formatting of this note might be different from the original. Please credit Jordan Britton, PA-C with level of service and credit me for the procedure of knee arthrocentesis.

## (undated) NOTE — ED Provider Notes (Signed)
 Formatting of this note is different from the original. eMERGENCY dEPARTMENT eNCOUnter    CHIEF COMPLAINT   Chief Complaint  Patient presents with  ? Leg Swelling    Right leg swelling and pain worsening the last few days. Patient recently seen for the same x 2 weeks ago and fluis drawn off the knee at ortho  ? Leg Pain   HPI   Charles Lloyd. is a 21 y.o. male who presents right leg swelling pain it has been ongoing for over a month patient has been seen multiple times for this in the ER and had fluid drained off of his right knee as well as his infectious disease doctor as well as his orthopedic surgeon had repeat fluid drained off of his right knee.  Patient on Eliquis  for AFib.  Infection was ruled out however patient apparently has had prior infection of the knee and is still on antibiotics.  He states that he has been told to wear Ted hose however he states that he felt like it it made his knee swelling were so he stopped wearing them.  Denies focal numbness weakness or tingling denies any trauma denies fevers or chills denies any rashes.  PAST MEDICAL HISTORY   Past Medical History:  Diagnosis Date  ? Anemia   ? Arthritis    KNEES  ? Atrial fibrillation (HCC) 01/04/2010  ? CHF (congestive heart failure) (HCC)    EF40% on echo 2015  ? Chronic anticoagulation   ? Complication of anesthesia    DIFFICULT INTUB. PER PT. IN 2001, NO CARD GIVEN TO PT., STATES NO PROBLEMS WITH ANESTH. SINCE THEN  ? 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 Hypercholesterolemia 01/06/2010  ? Hypertension 01/06/2010  ? Neuropathy    FEET  ? Prostate cancer [facility]) 2001   surgery then RT  ? T2DM (type 2 diabetes mellitus) (HCC)    SURGICAL HISTORY   Past Surgical History:  Procedure Laterality Date  ? CARDIAC ELECTROPHYSIOLOGY MAPPING AND ABLATION  2015   Dr. Britta at [facility]  ? CARDIOVERSION  2017  ? CARPAL TUNNEL RELEASE  Right 07/02/2016  ? CATARACT EXTRACTION Left   ? COLONOSCOPY W/ POLYPECTOMY  03/2014  ? KNEE SURGERY     x2  ? LAPAROSCOPIC CHOLECYSTECTOMY  2016  ? PROSTATECTOMY  2001  ? TOTAL KNEE ARTHROPLASTY Left 2017   CURRENT MEDICATIONS   No current facility-administered medications for this encounter.   Current Outpatient Medications:  ?  ACCU-CHEK AVIVA PLUS TEST STRP Strp, Use to test blood sugars once daily as directed Dx:E11.65, Disp: 100 each, Rfl: 1 ?  atorvastatin  (LIPITOR) 20 MG tablet, TAKE 1 TABLET DAILY WITH EVENING MEAL, Disp: 90 tablet, Rfl: 0 ?  cyanocobalamin  (VITAMIN B-12) 1000 MCG tablet, Take 1 tablet (1,000 mcg total) by mouth daily., Disp: 30 tablet, Rfl: 11 ?  diltiazem  (CARDIZEM  CD) 360 MG 24 hr capsule, Take 1 capsule (360 mg total) by mouth daily., Disp: 30 capsule, Rfl: 0 ?  ELIQUIS  5 mg Tab tablet, TAKE 1 TABLET TWICE A DAY, Disp: 180 tablet, Rfl: 0 ?  furosemide  (LASIX ) 20 MG tablet, TAKE 1 TABLET DAILY, Disp: 90 tablet, Rfl: 1 ?  gabapentin  (NEURONTIN ) 300 MG capsule, Take 600 mg by mouth nightly., Disp: , Rfl:  ?  KLOR-CON  M20 20 mEq tablet, TAKE 1 TABLET (20 MEQ TOTAL) BY MOUTH DAILY., Disp: 90 tablet, Rfl: 1 ?  lisinopril  (PRINIVIL ,ZESTRIL ) 10 MG tablet, Take 1 tablet (10 mg total) by mouth daily., Disp: , Rfl:  ?  metFORMIN  (GLUCOPHAGE ) 500 MG tablet, Take 1 tablet (500 mg total) by mouth 2 (two) times daily with meals., Disp: 180 tablet, Rfl: 0 ?  metoprolol  tartrate (LOPRESSOR ) 50 MG tablet, Take 2 tablets (100 mg total) by mouth 2 (two) times daily., Disp: 180 tablet, Rfl: 3 ?  mometasone (NASONEX) 50 mcg/Actuation nasal spray, Use as directed twice daily., Disp: , Rfl:  ?  montelukast  (SINGULAIR ) 10 mg tablet, Take 10 mg by mouth nightly., Disp: , Rfl:   ALLERGIES   No Known Allergies  FAMILY HISTORY   Family History  Problem Relation Age of Onset  ? Diabetes Mother   ? Lung cancer Mother   ? Lymphoma Father   ? Heart disease Other        family hx of  late-onset cardiovascular disease  ? Cancer - Other Neg Hx        no family hx of heritable cancers  ? Colon cancer Neg Hx    SOCIAL HISTORY   Social History   Socioeconomic History  ? Marital status: Married    Spouse name: None  ? Number of children: None  ? Years of education: None  ? Highest education level: None  Occupational History  ? None  Social Needs  ? Financial resource strain: None  ? Food insecurity:    Worry: None    Inability: None  ? Transportation needs:    Medical: None    Non-medical: None  Tobacco Use  ? Smoking status: Never Smoker  ? Smokeless tobacco: Never Used  Substance and Sexual Activity  ? Alcohol  use: Yes    Comment: 12 weekly  ? Drug use: No  ? Sexual activity: None    Comment: Married  Lifestyle  ? Physical activity:    Days per week: None    Minutes per session: None  ? Stress: None  Relationships  ? Social connections:    Talks on phone: None    Gets together: None    Attends religious service: None    Active member of club or organization: None    Attends meetings of clubs or organizations: None    Relationship status: None  ? Intimate partner violence:    Fear of current or ex partner: None    Emotionally abused: None    Physically abused: None    Forced sexual activity: None  Other Topics Concern  ? None  Social History Narrative   Marital Status - Married   Children - None listed   Occupation - No information given   Regular Exercise - Yes, walking   Originally from [place], [place]   REVIEW OF SYSTEMS   All other systems reviewed and are negative  PHYSICAL EXAM   VITAL SIGNS: BP 118/81   Pulse 105   Temp 98.4 F (36.9 C) (Oral)   Resp 18   Ht 5' 10 (1.778 m)   Wt 160 lb (72.6 kg)   SpO2 100%   BMI 22.96 kg/m  Constitutional:  Well developed, well nourished, no acute distress HENT:  Normocephalic, atraumatic.  Normal external inspection. Extremities:  1 to 2+ pitting edema of the right lower extremity  good distal pulses dorsalis pedis posterior tibialis there is a knee effusion in the right no warmth Back: No midline or CVA tenderness.  Skin:  No rashes or lesions present Neurologic:  Awake and alert.  GCS  15.  Normal gross cognition and speech.  5/5 strength and normal sensation to light touch.  ED COURSE & MEDICAL DECISION MAKING   Pertinent labs & imaging studies reviewed. (See chart for details) 21 year old male who presents with right knee effusion right leg edema on Eliquis  prior multiple surgeries of the right knee with infection patient has no erythema warmth of the knee he has had multiple arthrocentesis of the knee recently with no evidence of infection.  Patient currently on antibiotics.  At this point no indication for recurrent arthrocentesis.  Recommend elevation of his leg does not sound like he has been elevating a quite high enough.  Also recommend read trying the Ted hose.  Patient has adequate pain medication at home.  Will follow up with his orthopedic surgeon as an outpatient.  FINAL IMPRESSION   Final diagnoses:  Chronic pain of right knee  Leg edema, right   Portions of this note may be dictated using Writer. Variances in spelling and vocabulary are possible and unintentional; not all errors are caught/corrected. Please notify dino if any discrepancies are noted or if the meaning of any statement is not clear.    Emmitt JINNY Dixons, MD 09/14/17 1021  Electronically signed by Emmitt JINNY Dixons, MD at 09/14/2017 10:21 AM EDT

---

## 2010-01-06 DIAGNOSIS — I10 Essential (primary) hypertension: Secondary | ICD-10-CM

## 2010-01-06 DIAGNOSIS — I110 Hypertensive heart disease with heart failure: Secondary | ICD-10-CM

## 2010-01-06 DIAGNOSIS — C61 Malignant neoplasm of prostate: Secondary | ICD-10-CM | POA: Insufficient documentation

## 2010-01-06 DIAGNOSIS — E7800 Pure hypercholesterolemia, unspecified: Secondary | ICD-10-CM | POA: Insufficient documentation

## 2010-01-06 HISTORY — DX: Essential (primary) hypertension: I10

## 2010-01-06 HISTORY — DX: Pure hypercholesterolemia, unspecified: E78.00

## 2010-01-06 HISTORY — DX: Hypertensive heart disease with heart failure: I11.0

## 2010-01-06 HISTORY — DX: Malignant neoplasm of prostate: C61

## 2011-08-16 DIAGNOSIS — Z8679 Personal history of other diseases of the circulatory system: Secondary | ICD-10-CM | POA: Insufficient documentation

## 2012-03-04 DIAGNOSIS — G4700 Insomnia, unspecified: Secondary | ICD-10-CM

## 2012-03-04 HISTORY — DX: Insomnia, unspecified: G47.00

## 2012-07-14 DIAGNOSIS — E1140 Type 2 diabetes mellitus with diabetic neuropathy, unspecified: Secondary | ICD-10-CM | POA: Insufficient documentation

## 2013-04-16 DIAGNOSIS — I5042 Chronic combined systolic (congestive) and diastolic (congestive) heart failure: Secondary | ICD-10-CM

## 2013-04-16 DIAGNOSIS — I4819 Other persistent atrial fibrillation: Secondary | ICD-10-CM | POA: Insufficient documentation

## 2013-04-16 DIAGNOSIS — I509 Heart failure, unspecified: Secondary | ICD-10-CM

## 2013-04-16 DIAGNOSIS — I480 Paroxysmal atrial fibrillation: Secondary | ICD-10-CM | POA: Insufficient documentation

## 2013-04-16 DIAGNOSIS — K635 Polyp of colon: Secondary | ICD-10-CM

## 2013-04-16 DIAGNOSIS — B356 Tinea cruris: Secondary | ICD-10-CM

## 2013-04-16 DIAGNOSIS — I4891 Unspecified atrial fibrillation: Secondary | ICD-10-CM | POA: Insufficient documentation

## 2013-04-16 DIAGNOSIS — I5032 Chronic diastolic (congestive) heart failure: Secondary | ICD-10-CM | POA: Insufficient documentation

## 2013-04-16 DIAGNOSIS — D4959 Neoplasm of unspecified behavior of other genitourinary organ: Secondary | ICD-10-CM

## 2013-04-16 DIAGNOSIS — E1142 Type 2 diabetes mellitus with diabetic polyneuropathy: Secondary | ICD-10-CM | POA: Insufficient documentation

## 2013-04-16 HISTORY — DX: Heart failure, unspecified: I50.9

## 2013-04-16 HISTORY — DX: Type 2 diabetes mellitus with diabetic polyneuropathy: E11.42

## 2013-04-16 HISTORY — DX: Chronic diastolic (congestive) heart failure: I50.32

## 2013-04-16 HISTORY — DX: Polyp of colon: K63.5

## 2013-04-16 HISTORY — DX: Neoplasm of unspecified behavior of other genitourinary organ: D49.59

## 2013-04-16 HISTORY — DX: Tinea cruris: B35.6

## 2013-04-16 HISTORY — DX: Other persistent atrial fibrillation: I48.19

## 2013-04-20 DIAGNOSIS — Y9389 Activity, other specified: Secondary | ICD-10-CM

## 2013-04-20 HISTORY — DX: Activity, other specified: Y93.89

## 2013-09-07 DIAGNOSIS — Z79899 Other long term (current) drug therapy: Secondary | ICD-10-CM

## 2013-09-07 HISTORY — DX: Other long term (current) drug therapy: Z79.899

## 2014-02-01 DIAGNOSIS — L111 Transient acantholytic dermatosis [Grover]: Secondary | ICD-10-CM

## 2014-02-01 HISTORY — DX: Transient acantholytic dermatosis (grover): L11.1

## 2014-02-09 DIAGNOSIS — R195 Other fecal abnormalities: Secondary | ICD-10-CM | POA: Insufficient documentation

## 2014-11-20 DIAGNOSIS — Z9049 Acquired absence of other specified parts of digestive tract: Secondary | ICD-10-CM | POA: Insufficient documentation

## 2015-03-29 DIAGNOSIS — M1711 Unilateral primary osteoarthritis, right knee: Secondary | ICD-10-CM | POA: Insufficient documentation

## 2016-10-22 DIAGNOSIS — E538 Deficiency of other specified B group vitamins: Secondary | ICD-10-CM | POA: Insufficient documentation

## 2016-10-22 DIAGNOSIS — E663 Overweight: Secondary | ICD-10-CM

## 2016-10-22 HISTORY — DX: Overweight: E66.3

## 2017-05-07 DIAGNOSIS — M25562 Pain in left knee: Secondary | ICD-10-CM | POA: Insufficient documentation

## 2017-05-20 DIAGNOSIS — Z96659 Presence of unspecified artificial knee joint: Secondary | ICD-10-CM | POA: Insufficient documentation

## 2017-06-24 DIAGNOSIS — T8459XA Infection and inflammatory reaction due to other internal joint prosthesis, initial encounter: Secondary | ICD-10-CM | POA: Insufficient documentation

## 2017-06-24 DIAGNOSIS — Z96659 Presence of unspecified artificial knee joint: Secondary | ICD-10-CM

## 2017-06-24 HISTORY — DX: Presence of unspecified artificial knee joint: Z96.659

## 2017-06-24 HISTORY — DX: Infection and inflammatory reaction due to other internal joint prosthesis, initial encounter: T84.59XA

## 2018-01-28 ENCOUNTER — Ambulatory Visit: Payer: Medicare Other | Admitting: Internal Medicine

## 2018-01-28 ENCOUNTER — Encounter: Payer: Self-pay | Admitting: Internal Medicine

## 2018-01-28 VITALS — BP 140/70 | HR 57 | Temp 98.4°F | Ht 70.5 in | Wt 174.0 lb

## 2018-01-28 DIAGNOSIS — T8453XD Infection and inflammatory reaction due to internal right knee prosthesis, subsequent encounter: Secondary | ICD-10-CM

## 2018-01-28 DIAGNOSIS — E119 Type 2 diabetes mellitus without complications: Secondary | ICD-10-CM | POA: Diagnosis not present

## 2018-01-28 DIAGNOSIS — E1169 Type 2 diabetes mellitus with other specified complication: Secondary | ICD-10-CM

## 2018-01-28 DIAGNOSIS — C44311 Basal cell carcinoma of skin of nose: Secondary | ICD-10-CM | POA: Insufficient documentation

## 2018-01-28 DIAGNOSIS — Z23 Encounter for immunization: Secondary | ICD-10-CM

## 2018-01-28 DIAGNOSIS — I10 Essential (primary) hypertension: Secondary | ICD-10-CM

## 2018-01-28 DIAGNOSIS — E538 Deficiency of other specified B group vitamins: Secondary | ICD-10-CM

## 2018-01-28 DIAGNOSIS — I480 Paroxysmal atrial fibrillation: Secondary | ICD-10-CM

## 2018-01-28 DIAGNOSIS — Z96651 Presence of right artificial knee joint: Secondary | ICD-10-CM | POA: Diagnosis not present

## 2018-01-28 DIAGNOSIS — I4819 Other persistent atrial fibrillation: Secondary | ICD-10-CM | POA: Diagnosis not present

## 2018-01-28 DIAGNOSIS — E785 Hyperlipidemia, unspecified: Secondary | ICD-10-CM

## 2018-01-28 DIAGNOSIS — Z7901 Long term (current) use of anticoagulants: Secondary | ICD-10-CM | POA: Insufficient documentation

## 2018-01-28 DIAGNOSIS — C61 Malignant neoplasm of prostate: Secondary | ICD-10-CM

## 2018-01-28 DIAGNOSIS — I5032 Chronic diastolic (congestive) heart failure: Secondary | ICD-10-CM

## 2018-01-28 HISTORY — DX: Hyperlipidemia, unspecified: E78.5

## 2018-01-28 HISTORY — DX: Long term (current) use of anticoagulants: Z79.01

## 2018-01-28 HISTORY — DX: Type 2 diabetes mellitus with other specified complication: E11.69

## 2018-01-28 HISTORY — DX: Basal cell carcinoma of skin of nose: C44.311

## 2018-01-28 LAB — POCT GLYCOSYLATED HEMOGLOBIN (HGB A1C): Hemoglobin A1C: 5.8 % — AB (ref 4.0–5.6)

## 2018-01-28 MED ORDER — DILTIAZEM HCL ER COATED BEADS 360 MG PO CP24: 360.0000 mg | ORAL_CAPSULE | Freq: Every day | ORAL | 0 refills | Status: DC

## 2018-01-28 MED ORDER — FUROSEMIDE 20 MG PO TABS: 20.0000 mg | ORAL_TABLET | Freq: Two times a day (BID) | ORAL | 0 refills | Status: DC

## 2018-01-28 MED ORDER — GLUCOSE BLOOD VI STRP: ORAL_STRIP | 0 refills | Status: DC

## 2018-01-28 NOTE — Patient Instructions (Signed)
-  It was nice meeting you!  -We will send out your refills and referrals as discussed during your visit.  -Please schedule follow-up with me in 3 months for diabetes.

## 2018-01-28 NOTE — Addendum Note (Signed)
Addended by: Gwenyth Ober R on: 01/28/2018 03:34 PM   Modules accepted: Orders

## 2018-01-28 NOTE — Progress Notes (Signed)
New Patient Office Visit     CC/Reason for Visit: To establish care and for follow-up on chronic medical conditions Previous PCP: [facility] in [place], [place] Last Visit: Summer 2019  HPI: Charles Lloyd is a 77 y.o. male who is coming in today for the above mentioned reasons. Past Medical History is significant for the comorbidities listed below.  He has recently moved from [facility] to [place] to live with his son and is here to establish care.  He has a history of paroxysmal atrial fibrillation with initial ablation in 2015 at [facility], subsequently had cardioversion in 2017 and again repeat electrical cardioversion in September 2019.  He is maintained on chronic anticoagulation with Eliquis, he is rate controlled on Cardizem and metoprolol.  He also has a history of type 2 diabetes on metformin that is well controlled, A1c today was 5.8.  Also with well-controlled hypertension, hyperlipidemia, history of prostate cancer.  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  He saw ENT last week for a "growth" inside his left nostril.  This was removed, he tells me that he was called yesterday with a report of a basal cell carcinoma and ENT would like to see him again for further removal.  He has not set up care locally yet with cardiology or orthopedics.  He is requesting flu and shingles vaccinations today.  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  He is due for his annual Medicare wellness visit in July 2019.   Past Medical/Surgical History: Past Medical History:  Diagnosis Date  . Atrial fibrillation ([place])   . B12 deficiency   .  Cancer ([place])   . Chronic anticoagulation   . Chronic diastolic CHF (congestive heart failure) ([place])   . Diabetes mellitus without complication ([place])   . Diabetic neuropathy ([employer])   . Heart murmur   . Hyperlipidemia   . Hypertension   . Infection of prosthetic right knee joint ([place])   . S/P TKR (total knee replacement), bilateral     Past Surgical History:  Procedure Laterality Date  . APPENDECTOMY    . CHOLECYSTECTOMY      Social History:  reports that he has never smoked. He has never used smokeless tobacco. He reports that he drinks about 7.0 standard drinks of alcohol per week. He reports that he does not use drugs.  Allergies: No Known Allergies  Family History:  Family History  Problem Relation Age of Onset  . Cancer Mother   . Depression Mother   . Early death Mother   . Cancer Father   . Depression Father   . Early death Father      Current Outpatient Medications:  .  apixaban (ELIQUIS) 5 MG TABS tablet, TAKE 1 TABLET TWICE A DAY, Disp: , Rfl:  .  atorvastatin (LIPITOR) 20 MG tablet, TAKE 1 TABLET DAILY WITH EVENING MEAL, Disp: , Rfl:  .  diltiazem (CARDIZEM CD) 360 MG 24 hr capsule, Take 1 capsule (360 mg total) by mouth daily., Disp: 90 capsule, Rfl: 0 .  fluticasone furoate-vilanterol (BREO ELLIPTA) 100-25 MCG/INH AEPB, INHALE 1 PUFF BY MOUTH DAILY, Disp: , Rfl:  .  furosemide (LASIX)  20 MG tablet, Take 1 tablet (20 mg total) by mouth 2 (two) times daily., Disp: 120 tablet, Rfl: 0 .  gabapentin (NEURONTIN) 300 MG capsule, Take by mouth., Disp: , Rfl:  .  glucose blood ([employer] TEST STRIPS) test strip, Test 2x dailyDx: type 2 diabetes, uncontrolledfluctuating blood sugars, Disp: 100 each, Rfl: 0 .  glucose blood test strip, Use to test blood sugars once daily as directed Dx:E11.65, Disp: , Rfl:  .  lisinopril (PRINIVIL,ZESTRIL) 10 MG tablet, Take by mouth., Disp: , Rfl:  .  metFORMIN (GLUCOPHAGE) 500 MG tablet, Take by mouth., Disp: , Rfl:  .  metoprolol  tartrate (LOPRESSOR) 50 MG tablet, Take by mouth., Disp: , Rfl:  .  mometasone (NASONEX) 50 MCG/ACT nasal spray, Use as directed twice daily., Disp: , Rfl:  .  montelukast (SINGULAIR) 10 MG tablet, Take by mouth., Disp: , Rfl:  .  potassium chloride SA (KLOR-CON M20) 20 MEQ tablet, TAKE 1 TABLET (20 MEQ TOTAL) BY MOUTH DAILY., Disp: , Rfl:  .  vitamin B-12 (CYANOCOBALAMIN) 1000 MCG tablet, Take by mouth., Disp: , Rfl:   Review of Systems:  Constitutional: Denies fever, chills, diaphoresis, appetite change and fatigue.  HEENT: Denies photophobia, eye pain, redness, hearing loss, ear pain, congestion, sore throat, rhinorrhea, sneezing, mouth sores, trouble swallowing, neck pain, neck stiffness and tinnitus.   Respiratory: Denies SOB, DOE, cough, chest tightness,  and wheezing.   Cardiovascular: Denies chest pain, palpitations and leg swelling.  Gastrointestinal: Denies nausea, vomiting, abdominal pain, diarrhea, constipation, blood in stool and abdominal distention.  Genitourinary: Denies dysuria, urgency, frequency, hematuria, flank pain and difficulty urinating.  Endocrine: Denies: hot or cold intolerance, sweats, changes in hair or nails, polyuria, polydipsia. Musculoskeletal: Denies myalgias, back pain, joint swelling, arthralgias and gait problem.  Skin: Denies pallor, rash and wound.  Neurological: Denies dizziness, seizures, syncope, weakness, light-headedness, numbness and headaches.  Hematological: Denies adenopathy. Easy bruising, personal or family bleeding history  Psychiatric/Behavioral: Denies suicidal ideation, mood changes, confusion, nervousness, sleep disturbance and agitation    Physical Exam: Vitals:   01/28/18 1116  BP: 140/70  Pulse: (!) 57  Temp: 98.4 F (36.9 C)  TempSrc: Oral  SpO2: 99%  Weight: 174 lb (78.9 kg)  Height: 5' 10.5" (1.791 m)   Body mass index is 24.61 kg/m.   Constitutional: NAD, calm, comfortable Eyes: PERRL, lids and conjunctivae  normal ENMT: Mucous membranes are moist. Posterior pharynx clear of any exudate or lesions. Normal dentition.  Neck: normal, supple, no masses, no thyromegaly Respiratory: clear to auscultation bilaterally, no wheezing, no crackles. Normal respiratory effort. No accessory muscle use.  Cardiovascular: Regular rate and rhythm, no murmurs / rubs / gallops. No extremity edema. 2+ pedal pulses. No carotid bruits.  Abdomen: no tenderness, no masses palpated. No hepatosplenomegaly. Bowel sounds positive.  Musculoskeletal: no clubbing / cyanosis. No joint deformity upper and lower extremities. Good ROM, no contractures. Normal muscle tone.  Skin: no rashes, lesions, ulcers. No induration Neurologic: CN 2-12 grossly intact. Sensation intact, DTR normal. Strength 5/5 in all 4.  Psychiatric: Normal judgment and insight. Alert and oriented x 3. Normal mood.    Impression and Plan:  Atrial fibrillation, paroxysmal -Appears to be in sinus rhythm today, is rate controlled, he is chronically anticoagulated on Eliquis.  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 -He has had prior basal cell carcinomas removed, will refer to dermatology for  at least annual examinations.  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   Diabetes mellitus without complication ([place])  - Lab Results  Component Value Date   HGBA1C 5.8 (A) 01/28/2018   -Well-controlled, continue metformin.   Essential hypertension -Well-controlled, continue current medications.  Hyperlipidemia associated with type 2 diabetes mellitus ([place]) -On atorvastatin 20 mg daily, will schedule repeat LDL at annual visit.  Chronic diastolic  CHF (congestive heart failure) (HCC) -Compensated, on beta-blocker, aspirin, statin, ACE inhibitor, Lasix.  Prostate cancer ([place])  B12 deficiency -Check B12 levels at time of annual wellness visit.     Patient Instructions  -It was nice meeting you!  -We will send out your refills and referrals as discussed during your visit.  -Please schedule follow-up with me in 3 months for diabetes.     Lelon Frohlich, MD [place] [facility]

## 2018-02-24 ENCOUNTER — Telehealth: Payer: Self-pay | Admitting: Internal Medicine

## 2018-02-24 MED ORDER — METFORMIN HCL 500 MG PO TABS: 500.0000 mg | ORAL_TABLET | Freq: Every day | ORAL | 1 refills | Status: DC

## 2018-02-24 NOTE — Telephone Encounter (Signed)
Pt is in need of Metformin 500 MG only have 2 days worth of Rx.  Pharm: [facility]  Pt is aware that Dr. Jerilee Hoh is out of the office today.

## 2018-02-24 NOTE — Telephone Encounter (Signed)
Rx sent 

## 2018-03-04 ENCOUNTER — Ambulatory Visit (INDEPENDENT_AMBULATORY_CARE_PROVIDER_SITE_OTHER): Payer: Self-pay

## 2018-03-04 ENCOUNTER — Encounter (INDEPENDENT_AMBULATORY_CARE_PROVIDER_SITE_OTHER): Payer: Self-pay | Admitting: Orthopaedic Surgery

## 2018-03-04 ENCOUNTER — Ambulatory Visit (INDEPENDENT_AMBULATORY_CARE_PROVIDER_SITE_OTHER): Payer: Medicare Other | Admitting: Orthopaedic Surgery

## 2018-03-04 DIAGNOSIS — Z96653 Presence of artificial knee joint, bilateral: Secondary | ICD-10-CM

## 2018-03-04 NOTE — Progress Notes (Signed)
Office Visit Note   Patient: Charles Lloyd.           Date of Birth: 11/16/1940           MRN: [id number] Visit Date: 03/04/2018              Requested by: Isaac Bliss, [name], MD [place], [place] [id number] PCP: Isaac Bliss, [name], MD   Assessment & Plan: Visit Diagnoses:  1. History of bilateral knee replacement     Plan: I gave him reassurance that both knees look great.  He has my card.  He understands that if he has any issues with the knees at all not to hesitate to call and come see [place] immediately.  We can always see him for any other orthopedic issues as well.  All question concerns were answered and addressed.  Follow-up otherwise be as needed.  Follow-Up Instructions: Return if symptoms worsen or fail to improve.   Orders:  Orders Placed This Encounter  Procedures  . XR Knee 1-2 Views Left  . XR Knee 1-2 Views Right   No orders of the defined types were placed in this encounter.     Procedures: No procedures performed   Clinical Data: No additional findings.   Subjective: Chief Complaint  Patient presents with  . Left Knee - Follow-up  . Right Knee - Follow-up  Patient someone seen for the first time.  He has a history of bilateral total knee arthroplasties done in [facility] in the last several years.  He has now moved to [facility] and wants to establish a relationship with an orthopedic surgeon here.  He says his left knee was easy but the right knee did have a postoperative infection and required long-term IV antibiotics as well as an irrigation debridement and changing of the components.  Since then he is done very well.  In July of this year he did have a large hematoma that they had to aspirate.  They felt likely that scar tissue had somehow caused this.  He says now he is pain-free with both knees.  He denies any swelling of both knees.  He says they are doing great overall and he has  no issues with them but again he wants to establish relationship here.  HPI  Review of Systems He currently denies any headache, chest pain, shortness of breath, fever, chills, nausea, vomiting.  Objective: Vital Signs: There were no vitals taken for this visit.  Physical Exam He is alert orient x3 and in no acute distress Ortho Exam Examination of both knees show well-healed midline surgical incisions.  Neither knee has an effusion.  Neither knee is warm or red.  Both knees feel ligamentously stable and have excellent range of motion. Specialty Comments:  No specialty comments available.  Imaging: Xr Knee 1-2 Views Left  Result Date: 03/04/2018 2 views of the left knee show a total knee arthroplasty with no complicating features.  The alignment is near-anatomic.  There is no evidence of loosening or effusion.  Xr Knee 1-2 Views Right  Result Date: 03/04/2018 2 views of the right knee show a total knee arthroplasty with no complicating features or malalignment.  There is no evidence of loosening or an effusion.    PMFS History: Patient Active Problem List   Diagnosis Date Noted  . Paroxysmal atrial fibrillation ([place]) 01/28/2018  . On continuous oral anticoagulation 01/28/2018  . HTN (hypertension) 01/28/2018  .  Hyperlipidemia associated with type 2 diabetes mellitus ([place]) 01/28/2018  . Chronic diastolic CHF (congestive heart failure) ([place]) 01/28/2018  . Prostate cancer ([place]) 01/28/2018  . Infection of prosthetic right knee joint ([place]) 01/28/2018  . 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 B12 deficiency 01/28/2018   Past Medical History:  Diagnosis Date  . Atrial fibrillation ([place])   . B12 deficiency   . Cancer ([place])   . Chronic anticoagulation   . Chronic diastolic CHF (congestive heart failure) ([place])   . Diabetes mellitus without complication ([place])   . Diabetic neuropathy ([place])   . Heart murmur   . Hyperlipidemia   . Hypertension   . Infection  of prosthetic right knee joint ([place])   . S/P TKR (total knee replacement), bilateral     Family History  Problem Relation Age of Onset  . Cancer Mother   . Depression Mother   . Early death Mother   . Cancer Father   . Depression Father   . Early death Father     Past Surgical History:  Procedure Laterality Date  . APPENDECTOMY    . CHOLECYSTECTOMY     Social History   Occupational History  . Not on file  Tobacco Use  . Smoking status: Never Smoker  . Smokeless tobacco: Never Used  Substance and Sexual Activity  . Alcohol use: Yes    Alcohol/week: 7.0 standard drinks    Types: 7 Cans of beer per week  . Drug use: Never  . Sexual activity: Not Currently

## 2018-03-06 DIAGNOSIS — Z7901 Long term (current) use of anticoagulants: Secondary | ICD-10-CM | POA: Insufficient documentation

## 2018-03-09 NOTE — Progress Notes (Addendum)
Cardiology Office Note:    Date:  03/10/2018   ID:  [name], DOB Oct 17, 1940, MRN [id number]  PCP:  Isaac Bliss, [name], MD  Cardiologist:  [name], MD   Referring MD: Isaac Bliss, Estel*  ASSESSMENT:    1. Persistent atrial fibrillation   2. Chronic anticoagulation   3. Hypertensive heart disease with heart failure ([place])   4. Hypercholesterolemia   5. Type 2 diabetes mellitus with diabetic polyneuropathy, without long-term current use of insulin ([place])   6. Chronic combined systolic and diastolic heart failure (HCC)   7. Other cardiomyopathy ([place])    PLAN:    In order of problems listed above:  1. Paroxysmal in sinus rhythm continue current treatment including calcium channel blocker beta-blocker anticoagulant 2. Continue his anticoagulant 3. Stable blood pressure target continue ACE inhibitor diuretic 4. Stable continue statin 5. Managed by his PCP 6. Compensated class I continue current treatment  Next appointment 6 months   Medication Adjustments/Labs and Tests Ordered: Current medicines are reviewed at length with the patient today.  Concerns regarding medicines are outlined above.  No orders of the defined types were placed in this encounter.  No orders of the defined types were placed in this encounter.    Chief Complaint  Patient presents with  . Follow-up     for   . Atrial Fibrillation  . Congestive Heart Failure  . Hypertension  . Hyperlipidemia    History of Present Illness:    Charles Lloyd. is a 78 y.o. male with a history of persistent  atrial fibrillation with initial ablation in 2015 at [facility], subsequently had cardioversion in 2017 and again repeat electrical cardioversion in September 2019, heart failure EF 40%, hypertension and hyperlipidemia. He is maintained on chronic anticoagulation with Eliquis, he is rate controlled on Cardizem and metoprolol. He is being seen today to establish cardiology care at the request  of Isaac Bliss, Estel*.  Pro BNP 10/15/17 1997  He leads a very full active life golfs and has no exercise intolerance dyspnea chest pain palpitation or syncope.  We are both pleased he is in sinus rhythm today without an antiarrhythmic drug.  He has had no edema orthopnea chest pain palpitation or syncope.  Compliant with medications including anticoagulant no bleeding complication.  Medical records reviewed prior to the visit. Past Medical History:  Diagnosis Date  . Atrial fibrillation ([place])   . B12 deficiency   . 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 Cancer ([place])   . CHF (congestive heart failure) ([place]) 04/16/2013   Functional class II, ejection fraction 35-40%  . Chronic anticoagulation   . Chronic diastolic (congestive) heart failure (HCC) 04/16/2013   Functional class II, ejection fraction 35-40%  Last Assessment & Plan:  Clinically stable Volume well controlled meds reviewed  . Chronic diastolic CHF (congestive heart failure) ([place])   . Colon polyps 04/16/2013  . Diabetes mellitus without complication ([place])   . Diabetic neuropathy ([place])   . Essential hypertension 01/06/2010   Last Assessment & Plan:  Well controlled Continue med management  . 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  Steroid usage was discontinued several months ago.  Follow up with Dermatology as planned.  Marland Kitchen Heart murmur   . Hypercholesterolemia 01/06/2010   Last Assessment & Plan:  Repeat labs recommended  . Hyperlipidemia   . Hyperlipidemia associated with type 2 diabetes mellitus ([place])  01/28/2018  . Hypertension   . Infection of prosthetic right knee joint ([place])   . 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 On amiodarone therapy 09/07/2013  . On continuous oral anticoagulation 01/28/2018  . Overweight (BMI  25.0-29.9) 10/22/2016  . Persistent atrial fibrillation 04/16/2013   Last Assessment & Plan:  Rate controlled Continue med management eliquis for stroke preventino  Formatting of this note might be different from the original.  Drug  HX Current Rx Pre-ABL inefficacy Pre-ABL intolerant Post-ABL inefficacy Post-ABL intolerant max dose/24h M/Y end comments  sotalol                  dofetilide                  flecainide                  propafenone                  am  . S/P TKR (total knee replacement), bilateral   . Tinea cruris 04/16/2013  . Type 2 diabetes mellitus with diabetic polyneuropathy, without long-term current use of insulin ([place]) 04/16/2013   Last Assessment & Plan:  Labs today Pt reports well controlled on ambulatory monitoring    Past Surgical History:  Procedure Laterality Date  . APPENDECTOMY    . CARDIAC ELECTROPHYSIOLOGY STUDY AND ABLATION    . CARDIOVERSION    . CHOLECYSTECTOMY    . PROSTATECTOMY    . REPLACEMENT TOTAL KNEE BILATERAL      Current Medications: Current Meds  Medication Sig  . apixaban (ELIQUIS) 5 MG TABS tablet TAKE 1 TABLET TWICE A DAY  . atorvastatin (LIPITOR) 20 MG tablet TAKE 1 TABLET DAILY WITH EVENING MEAL  . diltiazem (CARDIZEM CD) 360 MG 24 hr capsule Take 1 capsule (360 mg total) by mouth daily.  . fluticasone furoate-vilanterol (BREO ELLIPTA) 100-25 MCG/INH AEPB INHALE 1 PUFF BY MOUTH DAILY  . furosemide (LASIX) 20 MG tablet Take 1 tablet (20 mg total) by mouth 2 (two) times daily.  Marland Kitchen gabapentin (NEURONTIN) 300 MG capsule Take 600 mg by mouth at bedtime.   Marland Kitchen glucose blood ([employer] TEST STRIPS) test strip Test 2x dailyDx: type 2 diabetes, uncontrolledfluctuating blood sugars  . glucose blood test strip Use to test blood sugars once daily as directed Dx:E11.65  . lisinopril (PRINIVIL,ZESTRIL) 10 MG tablet Take by mouth daily.   . metFORMIN (GLUCOPHAGE) 500 MG tablet Take 1 tablet (500 mg total) by mouth daily with breakfast.  . metoprolol tartrate  (LOPRESSOR) 50 MG tablet Take by mouth 2 (two) times daily.   . mometasone (NASONEX) 50 MCG/ACT nasal spray Use as directed twice daily.  . montelukast (SINGULAIR) 10 MG tablet Take by mouth at bedtime.      Allergies:   Patient has no known allergies.   Social History   Socioeconomic History  . Marital status: Widowed    Spouse name: Not on file  . Number of children: Not on file  . Years of education: Not on file  . Highest education level: Not on file  Occupational History  . Not on file  Social Needs  . Financial resource strain: Not on file  . Food insecurity:    Worry: Not on file    Inability: Not on file  . Transportation needs:  Medical: Not on file    Non-medical: Not on file  Tobacco Use  . Smoking status: Never Smoker  . Smokeless tobacco: Never Used  Substance and Sexual Activity  . Alcohol use: Yes    Alcohol/week: 2.0 standard drinks    Types: 2 Glasses of wine per week    Comment: daily  . Drug use: Never  . Sexual activity: Not Currently  Lifestyle  . Physical activity:    Days per week: Not on file    Minutes per session: Not on file  . Stress: Not on file  Relationships  . Social connections:    Talks on phone: Not on file    Gets together: Not on file    Attends religious service: Not on file    Active member of club or organization: Not on file    Attends meetings of clubs or organizations: Not on file    Relationship status: Not on file  Other Topics Concern  . Not on file  Social History Narrative  . Not on file     Family History: The patient's family history includes Cancer in his father and mother; Depression in his father and mother; Early death in his father and mother.  ROS:   Review of Systems  Constitution: Negative.  HENT: Negative.   Eyes: Negative.   Cardiovascular: Negative.   Respiratory: Negative.   Endocrine: Negative.   Hematologic/Lymphatic: Negative.   Skin: Negative.   Musculoskeletal: Negative.     Gastrointestinal: Negative.   Genitourinary: Negative.   Neurological: Negative.   Psychiatric/Behavioral: Negative.   Allergic/Immunologic: Negative.    Please see the history of present illness.     All other systems reviewed and are negative.  EKGs/Labs/Other Studies Reviewed:    The following studies were reviewed today:   EKG:  EKG is  ordered today.  The ekg ordered today demonstrates Logan normal  Recent Labs: No results found for requested labs within last 8760 hours.  Recent Lipid Panel   09/02/17 Chol 109 HDL 31 LDL 60 AIC 6.7% No results found for: CHOL, TRIG, HDL, CHOLHDL, VLDL, LDLCALC, LDLDIRECT  Physical Exam:    VS:  BP (!) 144/84 (BP Location: Left Arm, Patient Position: Sitting, Cuff Size: Normal)   Pulse 72   Ht 5\' 10"  (1.778 m)   Wt 177 lb 10 oz (80.6 kg)   SpO2 98%   BMI 25.49 kg/m     Wt Readings from Last 3 Encounters:  03/10/18 177 lb 10 oz (80.6 kg)  01/28/18 174 lb (78.9 kg)     GEN:  Well nourished, well developed in no acute distress HEENT: Normal NECK: No JVD; No carotid bruits LYMPHATICS: No lymphadenopathy CARDIAC: RRR, no murmurs, rubs, gallops RESPIRATORY:  Clear to auscultation without rales, wheezing or rhonchi  ABDOMEN: Soft, non-tender, non-distended MUSCULOSKELETAL:  No edema; No deformity  SKIN: Warm and dry NEUROLOGIC:  Alert and oriented x 3 PSYCHIATRIC:  Normal affect     Signed, [name], MD  03/10/2018 11:09 AM    [place]

## 2018-03-10 ENCOUNTER — Telehealth: Payer: Self-pay

## 2018-03-10 ENCOUNTER — Encounter: Payer: Self-pay | Admitting: Cardiology

## 2018-03-10 ENCOUNTER — Ambulatory Visit (INDEPENDENT_AMBULATORY_CARE_PROVIDER_SITE_OTHER): Payer: Medicare Other | Admitting: Cardiology

## 2018-03-10 VITALS — BP 144/84 | HR 68 | Ht 70.0 in | Wt 177.6 lb

## 2018-03-10 DIAGNOSIS — E7800 Pure hypercholesterolemia, unspecified: Secondary | ICD-10-CM | POA: Diagnosis not present

## 2018-03-10 DIAGNOSIS — I5042 Chronic combined systolic (congestive) and diastolic (congestive) heart failure: Secondary | ICD-10-CM

## 2018-03-10 DIAGNOSIS — I110 Hypertensive heart disease with heart failure: Secondary | ICD-10-CM

## 2018-03-10 DIAGNOSIS — I429 Cardiomyopathy, unspecified: Secondary | ICD-10-CM

## 2018-03-10 DIAGNOSIS — I428 Other cardiomyopathies: Secondary | ICD-10-CM

## 2018-03-10 DIAGNOSIS — Z7901 Long term (current) use of anticoagulants: Secondary | ICD-10-CM

## 2018-03-10 DIAGNOSIS — E1142 Type 2 diabetes mellitus with diabetic polyneuropathy: Secondary | ICD-10-CM

## 2018-03-10 DIAGNOSIS — I4819 Other persistent atrial fibrillation: Secondary | ICD-10-CM | POA: Diagnosis not present

## 2018-03-10 HISTORY — DX: Cardiomyopathy, unspecified: I42.9

## 2018-03-10 NOTE — Patient Instructions (Addendum)
Medication Instructions:  Your physician recommends that you continue on your current medications as directed. Please refer to the Current Medication list given to you today. If you need a refill on your cardiac medications before your next appointment, please call your pharmacy.   Lab work: Your physician recommends that you have lab work today: BMP, ProBNP  If you have labs (blood work) drawn today and your tests are completely normal, you will receive your results only by: Marland Kitchen MyChart Message (if you have MyChart) OR . A paper copy in the mail If you have any lab test that is abnormal or we need to change your treatment, we will call you to review the results.  Testing/Procedures: None  Follow-Up: At [facility], you and your health needs are our priority.  As part of our continuing mission to provide you with exceptional heart care, we have created designated Provider Care Teams.  These Care Teams include your primary Cardiologist (physician) and Advanced Practice Providers (APPs -  Physician Assistants and Nurse Practitioners) who all work together to provide you with the care you need, when you need it. You will need a follow up appointment in 6 months.  Please call our office 2 months in advance to schedule this appointment.     Atrial Fibrillation  Atrial fibrillation is a type of heartbeat that is irregular or fast (rapid). If you have this condition, your heart beats without any order. This makes it hard for your heart to pump blood in a normal way. Having this condition gives you more risk for stroke, heart failure, and other heart problems. Atrial fibrillation may start all of a sudden and then stop on its own, or it may become a long-lasting problem. What are the causes? This condition may be caused by heart conditions, such as:  High blood pressure.  Heart failure.  Heart valve disease.  Heart surgery. Other causes include:  Pneumonia.  Obstructive sleep  apnea.  Lung cancer.  Thyroid disease.  Drinking too much alcohol. Sometimes the cause is not known. What increases the risk? You are more likely to develop this condition if:  You smoke.  You are older.  You have diabetes.  You are overweight.  You have a family history of this condition.  You exercise often and hard. What are the signs or symptoms? Common symptoms of this condition include:  A feeling like your heart is beating very fast.  Chest pain.  Feeling short of breath.  Feeling light-headed or weak.  Getting tired easily. Follow these instructions at home: Medicines  Take over-the-counter and prescription medicines only as told by your doctor.  If your doctor gives you a blood-thinning medicine, take it exactly as told. Taking too much of it can cause bleeding. Taking too little of it does not protect you against clots. Clots can cause a stroke. Lifestyle      Do not use any tobacco products. These include cigarettes, chewing tobacco, and e-cigarettes. If you need help quitting, ask your doctor.  Do not drink alcohol.  Do not drink beverages that have caffeine. These include coffee, soda, and tea.  Follow diet instructions as told by your doctor.  Exercise regularly as told by your doctor. General instructions  If you have a condition that causes breathing to stop for a short period of time (apnea), treat it as told by your doctor.  Keep a healthy weight. Do not use diet pills unless your doctor says they are safe for you. Diet  pills may make heart problems worse.  Keep all follow-up visits as told by your doctor. This is important. Contact a doctor if:  You notice a change in the speed, rhythm, or strength of your heartbeat.  You are taking a blood-thinning medicine and you see more bruising.  You get tired more easily when you move or exercise.  You have a sudden change in weight. Get help right away if:   You have pain in your  chest or your belly (abdomen).  You have trouble breathing.  You have blood in your vomit, poop, or pee (urine).  You have any signs of a stroke. "BE FAST" is an easy way to remember the main warning signs: ? B - Balance. Signs are dizziness, sudden trouble walking, or loss of balance. ? E - Eyes. Signs are trouble seeing or a change in how you see. ? F - Face. Signs are sudden weakness or loss of feeling in the face, or the face or eyelid drooping on one side. ? A - Arms. Signs are weakness or loss of feeling in an arm. This happens suddenly and usually on one side of the body. ? S - Speech. Signs are sudden trouble speaking, slurred speech, or trouble understanding what people say. ? T - Time. Time to call emergency services. Write down what time symptoms started.  You have other signs of a stroke, such as: ? A sudden, very bad headache with no known cause. ? Feeling sick to your stomach (nausea). ? Throwing up (vomiting). ? Jerky movements you cannot control (seizure). These symptoms may be an emergency. Do not wait to see if the symptoms will go away. Get medical help right away. Call your local emergency services (911 in the [place] Do not drive yourself to the hospital. Summary  Atrial fibrillation is a type of heartbeat that is irregular or fast (rapid).  You are at higher risk of this condition if you smoke, are older, have diabetes, or are overweight.  Follow your doctor's instructions about medicines, diet, exercise, and follow-up visits.  Get help right away if you think that you have signs of a stroke. This information is not intended to replace advice given to you by your health care provider. Make sure you discuss any questions you have with your health care provider. Document Released: 11/22/2007 Document Revised: 04/05/2017 Document Reviewed: 04/05/2017 Elsevier Interactive Patient Education  2019 [employer].

## 2018-03-11 LAB — BASIC METABOLIC PANEL
BUN / CREAT RATIO: 18 (ref 10–24)
BUN: 15 mg/dL (ref 8–27)
CO2: 25 mmol/L (ref 20–29)
Calcium: 9.6 mg/dL (ref 8.6–10.2)
Chloride: 104 mmol/L (ref 96–106)
Creatinine, Ser: 0.84 mg/dL (ref 0.76–1.27)
GFR calc Af Amer: 99 mL/min/{1.73_m2} (ref >59)
GFR calc non Af Amer: 86 mL/min/{1.73_m2} (ref >59)
Glucose: 104 mg/dL — ABNORMAL HIGH (ref 65–99)
POTASSIUM: 4.4 mmol/L (ref 3.5–5.2)
SODIUM: 144 mmol/L (ref 134–144)

## 2018-03-11 LAB — PRO B NATRIURETIC PEPTIDE: NT-Pro BNP: 387 pg/mL (ref 0–486)

## 2018-03-11 NOTE — Telephone Encounter (Signed)
Encounter opened in error

## 2018-03-26 ENCOUNTER — Other Ambulatory Visit: Payer: Self-pay | Admitting: Internal Medicine

## 2018-03-26 MED ORDER — METFORMIN HCL 500 MG PO TABS: 500.0000 mg | ORAL_TABLET | Freq: Every day | ORAL | 1 refills | Status: DC

## 2018-03-26 MED ORDER — APIXABAN 5 MG PO TABS: 5.0000 mg | ORAL_TABLET | Freq: Two times a day (BID) | ORAL | 0 refills | Status: DC

## 2018-03-26 NOTE — Telephone Encounter (Signed)
Copied from [place] [id number]. Topic: Quick Communication - Rx Refill/Question >> Mar 26, 2018  9:24 AM Antonieta Iba C wrote: Medication: metFORMIN (GLUCOPHAGE) 500 MG tablet  and also apixaban (ELIQUIS) 5 MG TABS tablet   Has the patient contacted their pharmacy? Yes  (Agent: If no, request that the patient contact the pharmacy for the refill.) (Agent: If yes, when and what did the pharmacy advise?)  Preferred Pharmacy (with phone number or street name): [facility] - [address], [place] [phone] (Phone) [phone] (Fax)    Agent: Please be advised that RX refills may take up to 3 business days. We ask that you follow-up with your pharmacy.

## 2018-04-10 ENCOUNTER — Other Ambulatory Visit: Payer: Self-pay | Admitting: Internal Medicine

## 2018-04-10 MED ORDER — ATORVASTATIN CALCIUM 20 MG PO TABS: 20.0000 mg | ORAL_TABLET | Freq: Every day | ORAL | 0 refills | Status: DC

## 2018-04-10 NOTE — Telephone Encounter (Signed)
Copied from [place] [phone]. Topic: Quick Communication - Rx Refill/Question >> Apr 10, 2018  1:13 PM Selinda Flavin B, NT wrote: Medication: atorvastatin (LIPITOR) 20 MG tablet  Has the patient contacted their pharmacy? Yes.   (Agent: If no, request that the patient contact the pharmacy for the refill.) (Agent: If yes, when and what did the pharmacy advise?)  Preferred Pharmacy (with phone number or street name): [place], [place]: Please be advised that RX refills may take up to 3 business days. We ask that you follow-up with your pharmacy.

## 2018-04-10 NOTE — Telephone Encounter (Signed)
Requested medication (s) are due for refill today -yes  Requested medication (s) are on the active medication list -yes  Future visit scheduled -yes  Last refill: 2 months ago  Notes to clinic: Patient can in as new patient - RF through next appointment per PCP. Rx filled by historical provider.  Requested Prescriptions  Pending Prescriptions Disp Refills   atorvastatin (LIPITOR) 20 MG tablet       Cardiovascular:  Antilipid - Statins Failed - 04/10/2018  1:18 PM      Failed - Total Cholesterol in normal range and within 360 days    No results found for: CHOL, POCCHOL       Failed - LDL in normal range and within 360 days    No results found for: LDLCALC, LDLC, HIRISKLDL       Failed - HDL in normal range and within 360 days    No results found for: HDL       Failed - Triglycerides in normal range and within 360 days    No results found for: TRIG       Passed - Patient is not pregnant      Passed - Valid encounter within last 12 months    Recent Outpatient Visits          2 months ago Atrial fibrillation, persistent   Therapist, music at [place], MD      Future Appointments            In 1 month Isaac Bliss, [name], MD [place] [facility] [place], [facility]            Requested Prescriptions  Pending Prescriptions Disp Refills   atorvastatin (LIPITOR) 20 MG tablet       Cardiovascular:  Antilipid - Statins Failed - 04/10/2018  1:18 PM      Failed - Total Cholesterol in normal range and within 360 days    No results found for: CHOL, POCCHOL       Failed - LDL in normal range and within 360 days    No results found for: LDLCALC, LDLC, HIRISKLDL       Failed - HDL in normal range and within 360 days    No results found for: HDL       Failed - Triglycerides in normal range and within 360 days    No results found for: TRIG       Passed - Patient is not pregnant      Passed - Valid encounter within last 12 months   Recent Outpatient Visits          2 months ago Atrial fibrillation, persistent   Therapist, music at [employer], [name], MD      Future Appointments            In 1 month Isaac Bliss, [name], MD [employer] at [place], [facility]

## 2018-04-15 ENCOUNTER — Other Ambulatory Visit: Payer: Self-pay | Admitting: Internal Medicine

## 2018-04-15 NOTE — Telephone Encounter (Signed)
Pt is here to have his Rx gabapentin (NEURONTIN) 300 MG 2 a day.  Pharm:  [facility]  Pt would like to have a call when and if this medication is going to be sent in for him.

## 2018-04-16 MED ORDER — GABAPENTIN 300 MG PO CAPS: 600.0000 mg | ORAL_CAPSULE | Freq: Every day | ORAL | 2 refills | Status: DC

## 2018-04-21 ENCOUNTER — Other Ambulatory Visit: Payer: Self-pay | Admitting: Internal Medicine

## 2018-04-21 MED ORDER — DILTIAZEM HCL ER COATED BEADS 360 MG PO CP24: 360.0000 mg | ORAL_CAPSULE | Freq: Every day | ORAL | 1 refills | Status: DC

## 2018-04-21 NOTE — Telephone Encounter (Signed)
Copied from [place] [phone]. Topic: Quick Communication - Rx Refill/Question >> Apr 21, 2018  9:32 AM Ahmed Prima L wrote: Medication:diltiazem (CARDIZEM CD) 360 MG 24 hr capsule  Has the patient contacted their pharmacy? yes (Agent: If no, request that the patient contact the pharmacy for the refill.) (Agent: If yes, when and what did the pharmacy advise?)  Preferred Pharmacy (with phone number or street name): [facility] - [address], [place] [address] Phone: [phone] Fax: [phone]    Agent: Please be advised that RX refills may take up to 3 business days. We ask that you follow-up with your pharmacy.

## 2018-05-23 ENCOUNTER — Telehealth: Payer: Self-pay | Admitting: Internal Medicine

## 2018-05-23 MED ORDER — FUROSEMIDE 20 MG PO TABS: 20.0000 mg | ORAL_TABLET | Freq: Two times a day (BID) | ORAL | 1 refills | Status: DC

## 2018-05-23 NOTE — Telephone Encounter (Signed)
Copied from [place] [id number]. Topic: Quick Communication - Rx Refill/Question >> May 23, 2018  9:15 AM Blase Mess A wrote: Medication: furosemide (LASIX) 20 MG tablet [[id number]] - 90 day  Has the patient contacted their pharmacy? Yes  (Agent: If no, request that the patient contact the pharmacy for the refill.) (Agent: If yes, when and what did the pharmacy advise?)  Preferred Pharmacy (with phone number or street name): [facility] - [address], [address] [place] [phone] (Phone) [phone] (Fax)    Agent: Please be advised that RX refills may take up to 3 business days. We ask that you follow-up with your pharmacy.

## 2018-05-30 ENCOUNTER — Ambulatory Visit: Payer: Medicare Other | Admitting: Internal Medicine

## 2018-06-23 ENCOUNTER — Other Ambulatory Visit: Payer: Self-pay | Admitting: Internal Medicine

## 2018-07-03 ENCOUNTER — Other Ambulatory Visit: Payer: Self-pay | Admitting: Internal Medicine

## 2018-07-07 ENCOUNTER — Other Ambulatory Visit: Payer: Self-pay | Admitting: Internal Medicine

## 2018-07-09 ENCOUNTER — Telehealth: Payer: Self-pay | Admitting: Internal Medicine

## 2018-07-09 NOTE — Telephone Encounter (Signed)
Copied from [place]. Topic: Quick Communication - Rx Refill/Question >> Jul 09, 2018 11:27 AM Scherrie Gerlach wrote: Medication: gabapentin (NEURONTIN) 300 MG capsule  [facility]  Last refill was only 30 day with refills  [facility] - [address], [place] - [address] [phone] (Phone) [phone] (Fax)

## 2018-07-10 MED ORDER — GABAPENTIN 300 MG PO CAPS: 600.0000 mg | ORAL_CAPSULE | Freq: Every day | ORAL | 0 refills | Status: DC

## 2018-07-10 NOTE — Telephone Encounter (Signed)
Refill sent.

## 2018-07-17 ENCOUNTER — Encounter: Payer: Medicare Other | Admitting: Internal Medicine

## 2018-07-24 ENCOUNTER — Encounter: Payer: Self-pay | Admitting: Internal Medicine

## 2018-07-24 ENCOUNTER — Other Ambulatory Visit: Payer: Self-pay

## 2018-07-24 ENCOUNTER — Ambulatory Visit (INDEPENDENT_AMBULATORY_CARE_PROVIDER_SITE_OTHER): Payer: Medicare Other | Admitting: Internal Medicine

## 2018-07-24 VITALS — BP 110/80 | HR 81 | Temp 98.5°F | Ht 69.5 in | Wt 184.3 lb

## 2018-07-24 DIAGNOSIS — E1169 Type 2 diabetes mellitus with other specified complication: Secondary | ICD-10-CM | POA: Diagnosis not present

## 2018-07-24 DIAGNOSIS — E538 Deficiency of other specified B group vitamins: Secondary | ICD-10-CM

## 2018-07-24 DIAGNOSIS — I480 Paroxysmal atrial fibrillation: Secondary | ICD-10-CM | POA: Diagnosis not present

## 2018-07-24 DIAGNOSIS — Z96659 Presence of unspecified artificial knee joint: Secondary | ICD-10-CM

## 2018-07-24 DIAGNOSIS — C44311 Basal cell carcinoma of skin of nose: Secondary | ICD-10-CM

## 2018-07-24 DIAGNOSIS — E785 Hyperlipidemia, unspecified: Secondary | ICD-10-CM | POA: Diagnosis not present

## 2018-07-24 DIAGNOSIS — C61 Malignant neoplasm of prostate: Secondary | ICD-10-CM | POA: Diagnosis not present

## 2018-07-24 DIAGNOSIS — E1142 Type 2 diabetes mellitus with diabetic polyneuropathy: Secondary | ICD-10-CM | POA: Diagnosis not present

## 2018-07-24 DIAGNOSIS — Z0000 Encounter for general adult medical examination without abnormal findings: Secondary | ICD-10-CM | POA: Diagnosis not present

## 2018-07-24 DIAGNOSIS — Z23 Encounter for immunization: Secondary | ICD-10-CM

## 2018-07-24 DIAGNOSIS — H6123 Impacted cerumen, bilateral: Secondary | ICD-10-CM

## 2018-07-24 DIAGNOSIS — T8459XD Infection and inflammatory reaction due to other internal joint prosthesis, subsequent encounter: Secondary | ICD-10-CM

## 2018-07-24 DIAGNOSIS — I5042 Chronic combined systolic (congestive) and diastolic (congestive) heart failure: Secondary | ICD-10-CM

## 2018-07-24 LAB — LIPID PANEL
Cholesterol: 141 mg/dL (ref 0–200)
HDL: 44 mg/dL (ref >39.00)
LDL Cholesterol: 72 mg/dL (ref 0–99)
NonHDL: 96.65
Total CHOL/HDL Ratio: 3
Triglycerides: 125 mg/dL (ref 0.0–149.0)
VLDL: 25 mg/dL (ref 0.0–40.0)

## 2018-07-24 LAB — CBC WITH DIFFERENTIAL/PLATELET
Basophils Absolute: 0.1 10*3/uL (ref 0.0–0.1)
Basophils Relative: 0.9 % (ref 0.0–3.0)
Eosinophils Absolute: 0.4 10*3/uL (ref 0.0–0.7)
Eosinophils Relative: 5.9 % — ABNORMAL HIGH (ref 0.0–5.0)
HCT: 38.4 % — ABNORMAL LOW (ref 39.0–52.0)
Hemoglobin: 12.9 g/dL — ABNORMAL LOW (ref 13.0–17.0)
Lymphocytes Relative: 12.3 % (ref 12.0–46.0)
Lymphs Abs: 0.9 10*3/uL (ref 0.7–4.0)
MCHC: 33.7 g/dL (ref 30.0–36.0)
MCV: 86.8 fl (ref 78.0–100.0)
Monocytes Absolute: 0.3 10*3/uL (ref 0.1–1.0)
Monocytes Relative: 4.5 % (ref 3.0–12.0)
Neutro Abs: 5.6 10*3/uL (ref 1.4–7.7)
Neutrophils Relative %: 76.4 % (ref 43.0–77.0)
Platelets: 196 10*3/uL (ref 150.0–400.0)
RBC: 4.42 Mil/uL (ref 4.22–5.81)
RDW: 16 % — ABNORMAL HIGH (ref 11.5–15.5)
WBC: 7.3 10*3/uL (ref 4.0–10.5)

## 2018-07-24 LAB — COMPREHENSIVE METABOLIC PANEL
ALT: 16 U/L (ref 0–53)
AST: 18 U/L (ref 0–37)
Albumin: 4 g/dL (ref 3.5–5.2)
Alkaline Phosphatase: 78 U/L (ref 39–117)
BUN: 21 mg/dL (ref 6–23)
CO2: 27 mEq/L (ref 19–32)
Calcium: 9 mg/dL (ref 8.4–10.5)
Chloride: 104 mEq/L (ref 96–112)
Creatinine, Ser: 1.25 mg/dL (ref 0.40–1.50)
GFR: 56.22 mL/min — ABNORMAL LOW (ref >60.00)
Glucose, Bld: 157 mg/dL — ABNORMAL HIGH (ref 70–99)
Potassium: 3.9 mEq/L (ref 3.5–5.1)
Sodium: 143 mEq/L (ref 135–145)
Total Bilirubin: 0.9 mg/dL (ref 0.2–1.2)
Total Protein: 6.4 g/dL (ref 6.0–8.3)

## 2018-07-24 LAB — VITAMIN B12: Vitamin B-12: 207 pg/mL — ABNORMAL LOW (ref 211–911)

## 2018-07-24 LAB — PSA: PSA: 0.02 ng/mL — ABNORMAL LOW (ref 0.10–4.00)

## 2018-07-24 LAB — VITAMIN D 25 HYDROXY (VIT D DEFICIENCY, FRACTURES): VITD: 24.39 ng/mL — ABNORMAL LOW (ref 30.00–100.00)

## 2018-07-24 LAB — HEMOGLOBIN A1C: Hgb A1c MFr Bld: 6.9 % — ABNORMAL HIGH (ref 4.6–6.5)

## 2018-07-24 LAB — TSH: TSH: 0.87 u[IU]/mL (ref 0.35–4.50)

## 2018-07-24 NOTE — Addendum Note (Signed)
Addended by: Westley Hummer B on: 07/24/2018 02:49 PM   Modules accepted: Orders

## 2018-07-24 NOTE — Patient Instructions (Signed)
-Nice seeing you today!!  -Lab work today; will notify you when results are available.  -Final shingles immunization today.  -Schedule follow up in 3-4 months.   Preventive Care 76 Years and Older, Male Preventive care refers to lifestyle choices and visits with your health care provider that can promote health and wellness. What does preventive care include?   A yearly physical exam. This is also called an annual well check.  Dental exams once or twice a year.  Routine eye exams. Ask your health care provider how often you should have your eyes checked.  Personal lifestyle choices, including: ? Daily care of your teeth and gums. ? Regular physical activity. ? Eating a healthy diet. ? Avoiding tobacco and drug use. ? Limiting alcohol use. ? Practicing safe sex. ? Taking low doses of aspirin every day. ? Taking vitamin and mineral supplements as recommended by your health care provider. What happens during an annual well check? The services and screenings done by your health care provider during your annual well check will depend on your age, overall health, lifestyle risk factors, and family history of disease. Counseling Your health care provider may ask you questions about your:  Alcohol use.  Tobacco use.  Drug use.  Emotional well-being.  Home and relationship well-being.  Sexual activity.  Eating habits.  History of falls.  Memory and ability to understand (cognition).  Work and work Statistician. Screening You may have the following tests or measurements:  Height, weight, and BMI.  Blood pressure.  Lipid and cholesterol levels. These may be checked every 5 years, or more frequently if you are over 63 years old.  Skin check.  Lung cancer screening. You may have this screening every year starting at age 53 if you have a 30-pack-year history of smoking and currently smoke or have quit within the past 15 years.  Colorectal cancer screening. All  adults should have this screening starting at age 76 and continuing until age 34. You will have tests every 1-10 years, depending on your results and the type of screening test. People at increased risk should start screening at an earlier age. Screening tests may include: ? Guaiac-based fecal occult blood testing. ? Fecal immunochemical test (FIT). ? Stool DNA test. ? Virtual colonoscopy. ? Sigmoidoscopy. During this test, a flexible tube with a tiny camera (sigmoidoscope) is used to examine your rectum and lower colon. The sigmoidoscope is inserted through your anus into your rectum and lower colon. ? Colonoscopy. During this test, a long, thin, flexible tube with a tiny camera (colonoscope) is used to examine your entire colon and rectum.  Prostate cancer screening. Recommendations will vary depending on your family history and other risks.  Hepatitis C blood test.  Hepatitis B blood test.  Sexually transmitted disease (STD) testing.  Diabetes screening. This is done by checking your blood sugar (glucose) after you have not eaten for a while (fasting). You may have this done every 1-3 years.  Abdominal aortic aneurysm (AAA) screening. You may need this if you are a current or former smoker.  Osteoporosis. You may be screened starting at age 60 if you are at high risk. Talk with your health care provider about your test results, treatment options, and if necessary, the need for more tests. Vaccines Your health care provider may recommend certain vaccines, such as:  Influenza vaccine. This is recommended every year.  Tetanus, diphtheria, and acellular pertussis (Tdap, Td) vaccine. You may need a Td booster every 10 years.  Varicella vaccine. You may need this if you have not been vaccinated.  Zoster vaccine. You may need this after age 34.  Measles, mumps, and rubella (MMR) vaccine. You may need at least one dose of MMR if you were born in 1957 or later. You may also need a second  dose.  Pneumococcal 13-valent conjugate (PCV13) vaccine. One dose is recommended after age 31.  Pneumococcal polysaccharide (PPSV23) vaccine. One dose is recommended after age 65.  Meningococcal vaccine. You may need this if you have certain conditions.  Hepatitis A vaccine. You may need this if you have certain conditions or if you travel or work in places where you may be exposed to hepatitis A.  Hepatitis B vaccine. You may need this if you have certain conditions or if you travel or work in places where you may be exposed to hepatitis B.  Haemophilus influenzae type b (Hib) vaccine. You may need this if you have certain risk factors. Talk to your health care provider about which screenings and vaccines you need and how often you need them. This information is not intended to replace advice given to you by your health care provider. Make sure you discuss any questions you have with your health care provider. Document Released: 03/11/2015 Document Revised: 04/04/2017 Document Reviewed: 12/14/2014 Elsevier Interactive Patient Education  2019 [employer].

## 2018-07-24 NOTE — Progress Notes (Signed)
Established Patient Office Visit     CC/Reason for Visit: Annual preventive exam and subsequent Medicare wellness visit  HPI: Charles Lloyd. is a 78 y.o. male who is coming in today for the above mentioned reasons. Past Medical History is significant for: Atrial fibrillation, chronic diastolic heart failure who is stable and has been seen by cardiology locally.  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  He is a never smoker, drinks alcohol only occasionally. He has routine eye and dental care He is up-to-date on all immunizations except for his second shingles vaccination which he agrees to receive today. He had a colonoscopy 2 years ago and was told to return in 5 years, will try and obtain records.    Past Medical/Surgical History: Past Medical History:  Diagnosis Date  . Atrial fibrillation ([place])   . B12 deficiency   . 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 Cancer ([place])   . CHF (congestive heart failure) ([place] 04/16/2013   Functional class II, ejection fraction 35-40%  . Chronic anticoagulation   . Chronic diastolic (congestive) heart failure (HCC) 04/16/2013   Functional class II, ejection fraction 35-40%  Last Assessment & Plan:  Clinically stable Volume well controlled meds reviewed  . Chronic diastolic CHF (congestive heart failure) ([place])   . Colon polyps 04/16/2013  . Diabetes mellitus without complication ([place])   . Diabetic neuropathy ([place])   . Essential  hypertension 01/06/2010   Last Assessment & Plan:  Well controlled Continue med management  . 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  Steroid usage was discontinued several months ago.  Follow up with Dermatology as planned.  Marland Kitchen Heart murmur   . Hypercholesterolemia 01/06/2010   Last Assessment & Plan:  Repeat labs recommended  . Hyperlipidemia   . Hyperlipidemia associated with type 2 diabetes mellitus ([place]) 01/28/2018  . Hypertension   . Infection of prosthetic right knee joint ([place])   . 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 On amiodarone therapy 09/07/2013  . On continuous oral anticoagulation 01/28/2018  . Overweight (BMI 25.0-29.9) 10/22/2016  . Persistent atrial fibrillation 04/16/2013   Last Assessment & Plan:  Rate controlled Continue med management eliquis for stroke preventino  Formatting of this note might be different from the original.  Drug  HX Current Rx Pre-ABL inefficacy Pre-ABL intolerant Post-ABL inefficacy Post-ABL intolerant max dose/24h M/Y end comments  sotalol                  dofetilide                  flecainide                  propafenone  am  . S/P TKR (total knee replacement), bilateral   . Tinea cruris 04/16/2013  . Type 2 diabetes mellitus with diabetic polyneuropathy, without long-term current use of insulin ([place]) 04/16/2013   Last Assessment & Plan:  Labs today Pt reports well controlled on ambulatory monitoring    Past Surgical History:  Procedure Laterality Date  . APPENDECTOMY    . CARDIAC ELECTROPHYSIOLOGY STUDY AND ABLATION    . CARDIOVERSION    . CHOLECYSTECTOMY    . PROSTATECTOMY    . REPLACEMENT TOTAL KNEE BILATERAL      Social History:  reports that he has never smoked. He has never used smokeless tobacco. He reports current alcohol use of about 2.0  standard drinks of alcohol per week. He reports that he does not use drugs.  Allergies: No Known Allergies  Family History:  Family History  Problem Relation Age of Onset  . Cancer Mother   . Depression Mother   . Early death Mother   . Cancer Father   . Depression Father   . Early death Father      Current Outpatient Medications:  .  atorvastatin (LIPITOR) 20 MG tablet, TAKE 1 TABLET DAILY AT 6 P.M., Disp: 90 tablet, Rfl: 1 .  diltiazem (CARDIZEM CD) 360 MG 24 hr capsule, Take 1 capsule (360 mg total) by mouth daily., Disp: 90 capsule, Rfl: 1 .  ELIQUIS 5 MG TABS tablet, TAKE 1 TABLET TWICE A DAY, Disp: 180 tablet, Rfl: 1 .  fluticasone furoate-vilanterol (BREO ELLIPTA) 100-25 MCG/INH AEPB, INHALE 1 PUFF BY MOUTH DAILY, Disp: , Rfl:  .  furosemide (LASIX) 20 MG tablet, Take 1 tablet (20 mg total) by mouth 2 (two) times daily., Disp: 180 tablet, Rfl: 1 .  gabapentin (NEURONTIN) 300 MG capsule, Take 2 capsules (600 mg total) by mouth at bedtime., Disp: 180 capsule, Rfl: 0 .  glucose blood ([employer] TEST STRIPS) test strip, Test 2x dailyDx: type 2 diabetes, uncontrolledfluctuating blood sugars, Disp: 100 each, Rfl: 0 .  glucose blood test strip, Use to test blood sugars once daily as directed Dx:E11.65, Disp: , Rfl:  .  lisinopril (PRINIVIL,ZESTRIL) 10 MG tablet, Take by mouth daily. , Disp: , Rfl:  .  metFORMIN (GLUCOPHAGE) 500 MG tablet, TAKE 1 TABLET BY MOUTH EVERY DAY WITH BREAKFAST, Disp: 90 tablet, Rfl: 0 .  metoprolol tartrate (LOPRESSOR) 50 MG tablet, Take by mouth 2 (two) times daily. , Disp: , Rfl:  .  mometasone (NASONEX) 50 MCG/ACT nasal spray, Use as directed twice daily., Disp: , Rfl:  .  montelukast (SINGULAIR) 10 MG tablet, Take by mouth at bedtime. , Disp: , Rfl:   Review of Systems:  Constitutional: Denies fever, chills, diaphoresis, appetite change and fatigue.  HEENT: Denies photophobia, eye pain, redness, hearing loss, ear pain, congestion, sore throat, rhinorrhea,  sneezing, mouth sores, trouble swallowing, neck pain, neck stiffness and tinnitus.   Respiratory: Denies SOB, DOE, cough, chest tightness,  and wheezing.   Cardiovascular: Denies chest pain, palpitations and leg swelling.  Gastrointestinal: Denies nausea, vomiting, abdominal pain, diarrhea, constipation, blood in stool and abdominal distention.  Genitourinary: Denies dysuria, urgency, frequency, hematuria, flank pain and difficulty urinating.  Endocrine: Denies: hot or cold intolerance, sweats, changes in hair or nails, polyuria, polydipsia. Musculoskeletal: Denies myalgias, back pain, joint swelling, arthralgias and gait problem.  Skin: Denies pallor, rash and wound.  Neurological: Denies dizziness, seizures, syncope, weakness, light-headedness, numbness and headaches.  Hematological: Denies adenopathy. Easy bruising, personal or family bleeding history  Psychiatric/Behavioral:  Denies suicidal ideation, mood changes, confusion, nervousness, sleep disturbance and agitation    Physical Exam: Vitals:   07/24/18 1003  BP: 110/80  Pulse: 81  Temp: 98.5 F (36.9 C)  TempSrc: Oral  SpO2: 97%  Weight: 184 lb 4.8 oz (83.6 kg)  Height: 5' 9.5" (1.765 m)    Body mass index is 26.83 kg/m.   Constitutional: NAD, calm, comfortable Eyes: PERRL, lids and conjunctivae normal ENMT: Mucous membranes are moist. Posterior pharynx clear of any exudate or lesions. Normal dentition. Tympanic membrane is obscured by cerumen bilaterally  neck: normal, supple, no masses, no thyromegaly Respiratory: clear to auscultation bilaterally, no wheezing, no crackles. Normal respiratory effort. No accessory muscle use.  Cardiovascular: Regular rate and rhythm, no murmurs / rubs / gallops. No extremity edema. 2+ pedal pulses. No carotid bruits.  Abdomen: no tenderness, no masses palpated. No hepatosplenomegaly. Bowel sounds positive.  Musculoskeletal: no clubbing / cyanosis. No joint deformity upper and lower  extremities. Good ROM, no contractures. Normal muscle tone.  Skin: no rashes, lesions, ulcers. No induration Neurologic: CN 2-12 grossly intact. Sensation intact, DTR normal. Strength 5/5 in all 4.  Psychiatric: Normal judgment and insight. Alert and oriented x 3. Normal mood.    Subsequent Medicare wellness visit   1. Risk factors, based on past  M,S,F -has multiple cardiovascular disease risk factors including hypertension, hyperlipidemia, age, sex, type 2 diabetes   2.  Physical activities: Remains physically active, walks and plays golf at least 3-4 times a week   3.  Depression/mood:  Mood is stable, no depression   4.  Hearing:  His hearing is a little impaired but he believes it does not affect his daily living, he will go to an audiologist to discuss possible hearing aids   5.  ADL's: Independent in all ADLs   6.  Fall risk:  Low fall risk   7.  Home safety: No problems identified   8.  Height weight, and visual acuity: Height, weight are documented further down in the note, visual acuity is 20/20 of the left eye and 20/30 of the right eye   9.  Counseling:  Discussed healthy diet, increase physical activity   10. Lab orders based on risk factors: Laboratory update will be reviewed   11. Referral :  None today   12. Care plan:  Continue follow-up with physicians as scheduled, compliance with medications, routine eye and dental care, needs referral to dermatology for multiple skin lesions   13. Cognitive assessment:  He is cognitively intact   14. Screening: Patient provided with a written and personalized 5-10 year screening schedule in the AVS.   yes   15. Provider List Update:   PCP, cardiology, orthopedics, ENT, dermatology  16. Advance Directives: Full code     Office Visit from 07/24/2018 in [facility]  PHQ-9 Total Score  0       Fall Risk  07/24/2018  Falls in the past year? 0  Number falls in past yr: 0  Injury with Fall? 0      Impression and Plan:  Encounter for preventive health examination  -Screening labs today. -Second shingles tomorrow. -Due for colonoscopy in 3 years. -Has routine eye and dental care. -Discussed healthy lifestyle including increased physical activity and healthy food choices.  Paroxysmal atrial fibrillation ([place]), Chronic  -Appears to be in sinus rhythm today, is chronically anticoagulated on Eliquis.  Prostate cancer [facility]), Chronic  -Check PSA, status post TURP and radiation.  Hyperlipidemia associated with type 2 diabetes mellitus (HCC)  -Check lipids today, continue atorvastatin.  Chronic combined systolic and diastolic heart failure ([place]) -Follows routinely with cardiology. -No echo on file.  Infection of prosthetic knee joint, subsequent encounter -Seen by Ortho, no issues.  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  Already has appointment scheduled for the summer.  B12 deficiency  -Check B12 level today.  Type 2 diabetes mellitus with diabetic polyneuropathy, without long-term current use of insulin (HCC)  -A1c was 5.8 in December 2019, continue metformin.  Recheck A1c today.  Impacted cerumen, bilateral Cerumen Desimpaction  Warm water was applied and gentle ear lavage performed on bilateral ears. There were no complications and following the desimpaction the tympanic membranes were visible. Tympanic membranes are intact following the procedure. Auditory canals are normal. The patient reported relief of symptoms after removal of cerumen.     Patient Instructions  -Nice seeing you today!!  -Lab work today; will notify you when results are available.  -Final shingles immunization today.  -Schedule follow up in 3-4 months.   Preventive Care 59 Years and Older, Male Preventive care refers to lifestyle choices and visits with your health care provider that can promote  health and wellness. What does preventive care include?   A yearly physical exam. This is also called an annual well check.  Dental exams once or twice a year.  Routine eye exams. Ask your health care provider how often you should have your eyes checked.  Personal lifestyle choices, including: ? Daily care of your teeth and gums. ? Regular physical activity. ? Eating a healthy diet. ? Avoiding tobacco and drug use. ? Limiting alcohol use. ? Practicing safe sex. ? Taking low doses of aspirin every day. ? Taking vitamin and mineral supplements as recommended by your health care provider. What happens during an annual well check? The services and screenings done by your health care provider during your annual well check will depend on your age, overall health, lifestyle risk factors, and family history of disease. Counseling Your health care provider may ask you questions about your:  Alcohol use.  Tobacco use.  Drug use.  Emotional well-being.  Home and relationship well-being.  Sexual activity.  Eating habits.  History of falls.  Memory and ability to understand (cognition).  Work and work Statistician. Screening You may have the following tests or measurements:  Height, weight, and BMI.  Blood pressure.  Lipid and cholesterol levels. These may be checked every 5 years, or more frequently if you are over 19 years old.  Skin check.  Lung cancer screening. You may have this screening every year starting at age 26 if you have a 30-pack-year history of smoking and currently smoke or have quit within the past 15 years.  Colorectal cancer screening. All adults should have this screening starting at age 71 and continuing until age 76. You will have tests every 1-10 years, depending on your results and the type of screening test. People at increased risk should start screening at an earlier age. Screening tests may include: ? Guaiac-based fecal occult blood testing. ?  Fecal immunochemical test (FIT). ? Stool DNA test. ? Virtual colonoscopy. ? Sigmoidoscopy. During this test, a flexible tube with a tiny camera (sigmoidoscope) is used to examine your rectum and lower colon. The sigmoidoscope is inserted through your anus into your rectum and lower colon. ? Colonoscopy. During this test,  a long, thin, flexible tube with a tiny camera (colonoscope) is used to examine your entire colon and rectum.  Prostate cancer screening. Recommendations will vary depending on your family history and other risks.  Hepatitis C blood test.  Hepatitis B blood test.  Sexually transmitted disease (STD) testing.  Diabetes screening. This is done by checking your blood sugar (glucose) after you have not eaten for a while (fasting). You may have this done every 1-3 years.  Abdominal aortic aneurysm (AAA) screening. You may need this if you are a current or former smoker.  Osteoporosis. You may be screened starting at age 9 if you are at high risk. Talk with your health care provider about your test results, treatment options, and if necessary, the need for more tests. Vaccines Your health care provider may recommend certain vaccines, such as:  Influenza vaccine. This is recommended every year.  Tetanus, diphtheria, and acellular pertussis (Tdap, Td) vaccine. You may need a Td booster every 10 years.  Varicella vaccine. You may need this if you have not been vaccinated.  Zoster vaccine. You may need this after age 45.  Measles, mumps, and rubella (MMR) vaccine. You may need at least one dose of MMR if you were born in 1957 or later. You may also need a second dose.  Pneumococcal 13-valent conjugate (PCV13) vaccine. One dose is recommended after age 26.  Pneumococcal polysaccharide (PPSV23) vaccine. One dose is recommended after age 22.  Meningococcal vaccine. You may need this if you have certain conditions.  Hepatitis A vaccine. You may need this if you have certain  conditions or if you travel or work in places where you may be exposed to hepatitis A.  Hepatitis B vaccine. You may need this if you have certain conditions or if you travel or work in places where you may be exposed to hepatitis B.  Haemophilus influenzae type b (Hib) vaccine. You may need this if you have certain risk factors. Talk to your health care provider about which screenings and vaccines you need and how often you need them. This information is not intended to replace advice given to you by your health care provider. Make sure you discuss any questions you have with your health care provider. Document Released: 03/11/2015 Document Revised: 04/04/2017 Document Reviewed: 12/14/2014 Elsevier Interactive Patient Education  2019 [place], MD [facility]

## 2018-07-25 ENCOUNTER — Encounter: Payer: Self-pay | Admitting: Internal Medicine

## 2018-07-25 ENCOUNTER — Other Ambulatory Visit: Payer: Self-pay | Admitting: Internal Medicine

## 2018-07-25 DIAGNOSIS — E559 Vitamin D deficiency, unspecified: Secondary | ICD-10-CM

## 2018-07-25 DIAGNOSIS — E1142 Type 2 diabetes mellitus with diabetic polyneuropathy: Secondary | ICD-10-CM

## 2018-07-25 HISTORY — DX: Vitamin D deficiency, unspecified: E55.9

## 2018-07-25 MED ORDER — METFORMIN HCL 500 MG PO TABS: 500.0000 mg | ORAL_TABLET | Freq: Two times a day (BID) | ORAL | 1 refills | Status: DC

## 2018-07-25 MED ORDER — VITAMIN D (ERGOCALCIFEROL) 1.25 MG (50000 UNIT) PO CAPS: 50000.0000 [IU] | ORAL_CAPSULE | ORAL | 0 refills | Status: AC

## 2018-07-29 ENCOUNTER — Other Ambulatory Visit: Payer: Self-pay | Admitting: Internal Medicine

## 2018-07-29 DIAGNOSIS — E559 Vitamin D deficiency, unspecified: Secondary | ICD-10-CM

## 2018-07-29 DIAGNOSIS — E538 Deficiency of other specified B group vitamins: Secondary | ICD-10-CM

## 2018-07-29 NOTE — Progress Notes (Signed)
Labs ordered.

## 2018-08-01 ENCOUNTER — Ambulatory Visit (INDEPENDENT_AMBULATORY_CARE_PROVIDER_SITE_OTHER): Payer: Medicare Other

## 2018-08-01 ENCOUNTER — Other Ambulatory Visit: Payer: Self-pay

## 2018-08-01 DIAGNOSIS — E538 Deficiency of other specified B group vitamins: Secondary | ICD-10-CM | POA: Diagnosis not present

## 2018-08-01 MED ORDER — CYANOCOBALAMIN 1000 MCG/ML IJ SOLN
1000.0000 ug | Freq: Once | INTRAMUSCULAR | Status: AC
  Administered 2018-08-01: 1000 ug via INTRAMUSCULAR

## 2018-08-01 NOTE — Progress Notes (Signed)
Per orders of Dr. Isaac Bliss , injection of CYANOCOBALAMIN 1000 MCG/mL given by Wyvonne Lenz. Patient tolerated injection well.

## 2018-08-08 ENCOUNTER — Other Ambulatory Visit: Payer: Self-pay

## 2018-08-08 ENCOUNTER — Ambulatory Visit (INDEPENDENT_AMBULATORY_CARE_PROVIDER_SITE_OTHER): Payer: Medicare Other | Admitting: *Deleted

## 2018-08-08 DIAGNOSIS — E538 Deficiency of other specified B group vitamins: Secondary | ICD-10-CM

## 2018-08-08 MED ORDER — CYANOCOBALAMIN 1000 MCG/ML IJ SOLN
1000.0000 ug | Freq: Once | INTRAMUSCULAR | Status: AC
  Administered 2018-08-08: 1000 ug via INTRAMUSCULAR

## 2018-08-13 NOTE — Progress Notes (Signed)
Medical screening examination/treatment was performed by qualified clinical staff member and, as supervising physician, I was immediately available for consultation/collaboration. I have reviewed documentation and agree with assessment and plan.  Estela Hernandez Acosta, MD   

## 2018-08-15 ENCOUNTER — Other Ambulatory Visit: Payer: Self-pay

## 2018-08-15 ENCOUNTER — Ambulatory Visit (INDEPENDENT_AMBULATORY_CARE_PROVIDER_SITE_OTHER): Payer: Medicare Other | Admitting: *Deleted

## 2018-08-15 DIAGNOSIS — E538 Deficiency of other specified B group vitamins: Secondary | ICD-10-CM | POA: Diagnosis not present

## 2018-08-15 MED ORDER — CYANOCOBALAMIN 1000 MCG/ML IJ SOLN
1000.0000 ug | Freq: Once | INTRAMUSCULAR | Status: AC
  Administered 2018-08-15: 1000 ug via INTRAMUSCULAR

## 2018-08-15 NOTE — Progress Notes (Signed)
Patient in for B-12 injection. B-12 administered in LD with no reactions.

## 2018-08-22 ENCOUNTER — Ambulatory Visit (INDEPENDENT_AMBULATORY_CARE_PROVIDER_SITE_OTHER): Payer: Medicare Other

## 2018-08-22 ENCOUNTER — Other Ambulatory Visit: Payer: Self-pay

## 2018-08-22 DIAGNOSIS — E538 Deficiency of other specified B group vitamins: Secondary | ICD-10-CM

## 2018-08-22 MED ORDER — CYANOCOBALAMIN 1000 MCG/ML IJ SOLN
1000.0000 ug | Freq: Once | INTRAMUSCULAR | Status: AC
  Administered 2018-08-22: 1000 ug via INTRAMUSCULAR

## 2018-08-22 NOTE — Progress Notes (Signed)
Per orders of Dr. Hernandez, injection of B12 given by Jezebelle Ledwell. Patient tolerated injection well.  

## 2018-09-12 ENCOUNTER — Other Ambulatory Visit: Payer: Self-pay

## 2018-09-12 ENCOUNTER — Ambulatory Visit (INDEPENDENT_AMBULATORY_CARE_PROVIDER_SITE_OTHER): Payer: Medicare Other | Admitting: *Deleted

## 2018-09-12 DIAGNOSIS — E538 Deficiency of other specified B group vitamins: Secondary | ICD-10-CM | POA: Diagnosis not present

## 2018-09-12 MED ORDER — CYANOCOBALAMIN 1000 MCG/ML IJ SOLN
1000.0000 ug | Freq: Once | INTRAMUSCULAR | Status: AC
  Administered 2018-09-12: 1000 ug via INTRAMUSCULAR

## 2018-09-12 NOTE — Progress Notes (Addendum)
Per orders of Dr. Hernandez, injection of B12 given by Farren Nelles. Patient tolerated injection well.  

## 2018-09-22 ENCOUNTER — Telehealth: Payer: Self-pay | Admitting: Internal Medicine

## 2018-09-22 ENCOUNTER — Ambulatory Visit (INDEPENDENT_AMBULATORY_CARE_PROVIDER_SITE_OTHER): Payer: Medicare Other

## 2018-09-22 ENCOUNTER — Other Ambulatory Visit: Payer: Self-pay | Admitting: Family Medicine

## 2018-09-22 ENCOUNTER — Telehealth: Payer: Self-pay | Admitting: Family Medicine

## 2018-09-22 ENCOUNTER — Ambulatory Visit (INDEPENDENT_AMBULATORY_CARE_PROVIDER_SITE_OTHER): Payer: Medicare Other | Admitting: Family Medicine

## 2018-09-22 ENCOUNTER — Encounter: Payer: Self-pay | Admitting: Family Medicine

## 2018-09-22 ENCOUNTER — Other Ambulatory Visit: Payer: Self-pay

## 2018-09-22 DIAGNOSIS — E538 Deficiency of other specified B group vitamins: Secondary | ICD-10-CM | POA: Diagnosis not present

## 2018-09-22 DIAGNOSIS — E559 Vitamin D deficiency, unspecified: Secondary | ICD-10-CM

## 2018-09-22 DIAGNOSIS — R0602 Shortness of breath: Secondary | ICD-10-CM

## 2018-09-22 LAB — BASIC METABOLIC PANEL
BUN: 20 mg/dL (ref 6–23)
CO2: 26 mEq/L (ref 19–32)
Calcium: 9.6 mg/dL (ref 8.4–10.5)
Chloride: 103 mEq/L (ref 96–112)
Creatinine, Ser: 1.02 mg/dL (ref 0.40–1.50)
GFR: 71.06 mL/min (ref >60.00)
Glucose, Bld: 125 mg/dL — ABNORMAL HIGH (ref 70–99)
Potassium: 4 mEq/L (ref 3.5–5.1)
Sodium: 141 mEq/L (ref 135–145)

## 2018-09-22 LAB — CBC
HCT: 37.2 % — ABNORMAL LOW (ref 39.0–52.0)
Hemoglobin: 12 g/dL — ABNORMAL LOW (ref 13.0–17.0)
MCHC: 32.4 g/dL (ref 30.0–36.0)
MCV: 87.6 fl (ref 78.0–100.0)
Platelets: 178 10*3/uL (ref 150.0–400.0)
RBC: 4.24 Mil/uL (ref 4.22–5.81)
RDW: 15.5 % (ref 11.5–15.5)
WBC: 6.5 10*3/uL (ref 4.0–10.5)

## 2018-09-22 LAB — VITAMIN D 25 HYDROXY (VIT D DEFICIENCY, FRACTURES): VITD: 58.8 ng/mL (ref 30.00–100.00)

## 2018-09-22 LAB — BRAIN NATRIURETIC PEPTIDE: Pro B Natriuretic peptide (BNP): 289 pg/mL — ABNORMAL HIGH (ref 0.0–100.0)

## 2018-09-22 LAB — VITAMIN B12: Vitamin B-12: 438 pg/mL (ref 211–911)

## 2018-09-22 IMAGING — DX CHEST - 2 VIEW
2 series · 2 of 2 positions shown · non-contrast
Comparison: None

CLINICAL DATA: CHF, weight gain, shortness of breath, cough at
night when lying flat

EXAM:
CHEST - 2 VIEW

[chest pa]
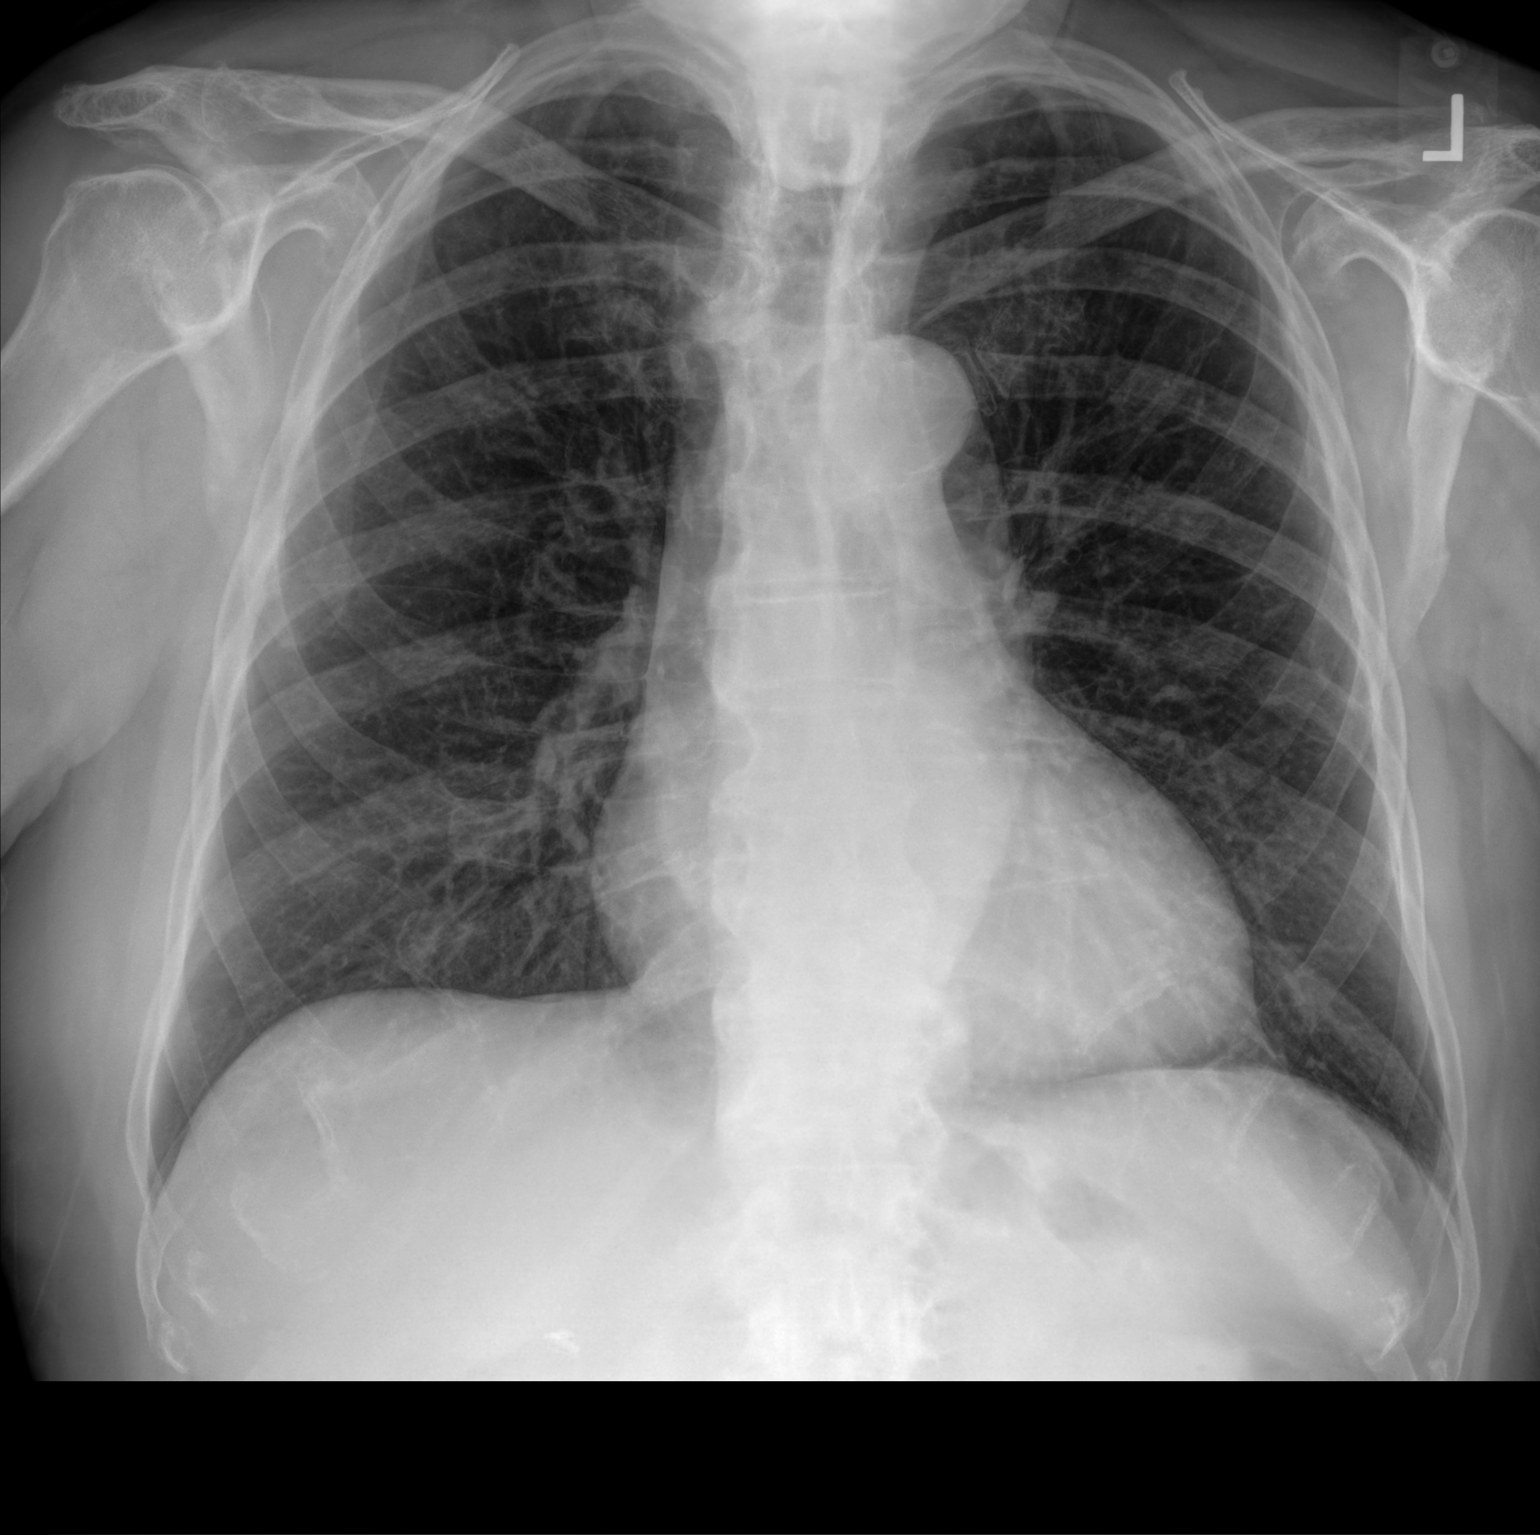

[chest lat]
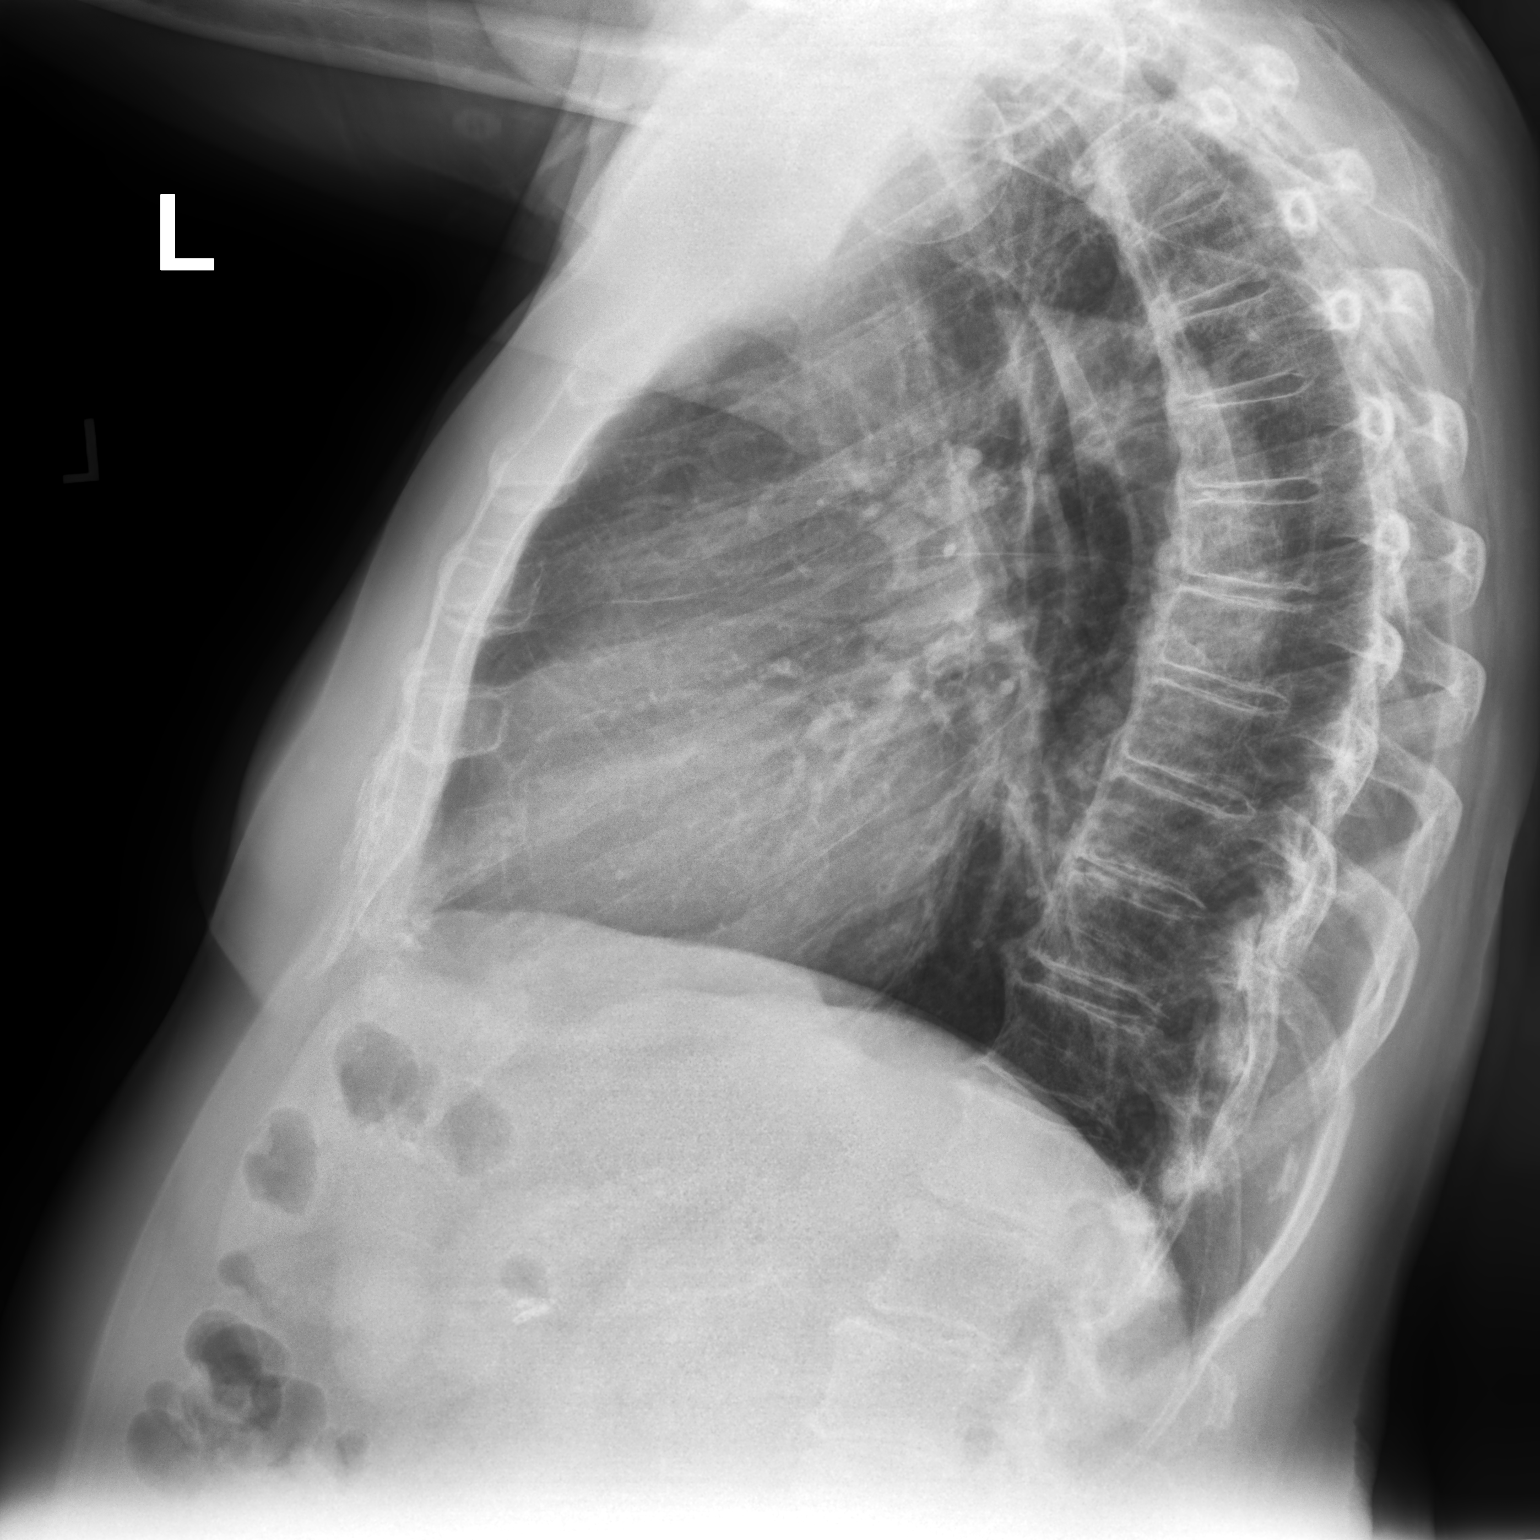

[2 of 2 positions shown; findings below may reference images not displayed]

FINDINGS: Upper normal heart size.

Mediastinal contours and pulmonary vascularity normal.

Atherosclerotic calcification aorta.

Lungs clear.

No infiltrate, pleural effusion or pneumothorax.

Scattered endplate spur formation thoracic spine.
IMPRESSION: No acute abnormalities.

## 2018-09-22 MED ORDER — POTASSIUM CHLORIDE CRYS ER 20 MEQ PO TBCR: 20.0000 meq | EXTENDED_RELEASE_TABLET | Freq: Two times a day (BID) | ORAL | 0 refills | Status: DC

## 2018-09-22 NOTE — Telephone Encounter (Signed)
Pt called in regards to BNP elevation (289).  Discussed symptoms likely 2/2 CHF exacerbation.  Pt to take an extra dose of lasix 20 mg this evening.  Pt to then take lasix 40 mg in am and 20 mg in pm x 3 days.  After the three days pt can resume his regular dose of lasix 20 mg in am and pm.  Pt wrote instructions down.  Will send in rx for Kdur 20 mEq to be taken while lasix increased.  Pt given precautions and advised to f/u at the end of the wk.  Grier Mitts, MD 09/22/18 5:42 pm

## 2018-09-22 NOTE — Telephone Encounter (Signed)
Patient needs a 90 day refill on Gabapentin 300 mg.   Pharmacy: [facility]

## 2018-09-22 NOTE — Progress Notes (Signed)
Virtual Visit via Telephone Note  I connected with Charles Lloyd. on 09/22/18 at  8:30 AM EDT by telephone and verified that I am speaking with the correct person using two identifiers.   I discussed the limitations, risks, security and privacy concerns of performing an evaluation and management service by telephone and the availability of in person appointments. I also discussed with the patient that there may be a patient responsible charge related to this service. The patient expressed understanding and agreed to proceed.  Location patient: home Location provider: work or home office Participants present for the call: patient, provider Patient did not have a visit in the prior 7 days to address this/these issue(s).   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 Jerilee Hoh.  Pt states on Friday he started having SOB and cough around 2 am.  States "spitting up clear, bubbly,sticky phlegm".  Pt denies LE edema.  Taking lasix 20 mg BID.   Endorses wheezing.  States can initially lay flat at night but has to get up in the middle of the night.  Only notices symptoms at night when laying down.  Pt denies HAs, fever, chills, rhinorrhea, sick contacts.  Pt denies eating salty foods.  States may have a prepared meal once per wk.  Pt endorses weight gain (10lbs) over the last few months, but contributes it to being at home 2/2 COVID-19 pandemic.  Pt saw Cardiology in Jan.   Observations/Objective: Patient sounds cheerful and well on the phone. I do not appreciate any SOB. Speech and thought processing are grossly intact. Patient reported vitals:  Assessment and Plan: SOB -likely 2/2 CHF exacerbation.  Consider pneumonia -will obtain CXR, BNP, BMP, CBC -based on labs and xray may  need to increase lasix -given precautions  Follow Up Instructions: F/u prn  I did not refer this patient for an OV in the next 24 hours for this/these issue(s).  I discussed the assessment and treatment plan with the patient. The patient was provided an opportunity to ask questions and all were answered. The patient agreed with the plan and demonstrated an understanding of the instructions.   The patient was advised to call back or seek an in-person evaluation if the symptoms worsen or if the condition fails to improve as anticipated.  I provided 11 minutes of non-face-to-face time during this encounter.   Billie Ruddy, MD

## 2018-09-22 NOTE — Addendum Note (Signed)
Addended by: Diona Foley on: 09/22/2018 11:23 AM   Modules accepted: Orders

## 2018-09-23 MED ORDER — GABAPENTIN 300 MG PO CAPS: 600.0000 mg | ORAL_CAPSULE | Freq: Every day | ORAL | 1 refills | Status: DC

## 2018-09-23 NOTE — Telephone Encounter (Signed)
Refill sent.

## 2018-10-02 ENCOUNTER — Ambulatory Visit: Payer: Self-pay

## 2018-10-02 NOTE — Telephone Encounter (Signed)
See note

## 2018-10-02 NOTE — Telephone Encounter (Signed)
Virtual/telephone visit scheduled. 

## 2018-10-02 NOTE — Telephone Encounter (Signed)
Pt called stating that he had a phone visit with Dr Volanda Napoleon on Monday.  He was coughing and had a lot of congestion. He states that he had a CXR that Dr Volanda Napoleon told him was CHF. She doubled his lasix and gave him 3 days of potassium. He called today stating that his breathing is fine when up and about but night he coughs and feels like he is very congested. Phlegm is white bubbly and sticky. Sometimes it is colored green/yellow. He has no fever or other symptoms. He has Hx of A Fib with recurrent bouts. He has had cardio versions. He has used inhalers in the past but was taken off by allergist. Luanna Salk). Care advice read to patient. He verbalized understanding. Call placed x2 to office no answer. Will route note for follow up. Patient is aware.   Reason for Disposition . [1] Continuous (nonstop) coughing interferes with work or school AND [2] no improvement using cough treatment per Care Advice  Answer Assessment - Initial Assessment Questions 1. ONSET: "When did the cough begin?"      Last weekend 2. SEVERITY: "How bad is the cough today?"      Severe at rest 3. RESPIRATORY DISTRESS: "Describe your breathing."     Now no wheezes only in morning 4. FEVER: "Do you have a fever?" If so, ask: "What is your temperature, how was it measured, and when did it start?"    none 5. SPUTUM: "Describe the color of your sputum" (clear, white, yellow, green)     White bubbly sticky sometimes yellow green 6. HEMOPTYSIS: "Are you coughing up any blood?" If so ask: "How much?" (flecks, streaks, tablespoons, etc.)     No 7. CARDIAC HISTORY: "Do you have any history of heart disease?" (e.g., heart attack, congestive heart failure)      CHF, A fib hx 8. LUNG HISTORY: "Do you have any history of lung disease?"  (e.g., pulmonary embolus, asthma, emphysema)    no 9. PE RISK FACTORS: "Do you have a history of blood clots?" (or: recent major surgery, recent prolonged travel, bedridden)    Last surgery 2019 10. OTHER  SYMPTOMS: "Do you have any other symptoms?" (e.g., runny nose, wheezing, chest pain)       no 11. PREGNANCY: "Is there any chance you are pregnant?" "When was your last menstrual period?"      N/A 12. TRAVEL: "Have you traveled out of the country in the last month?" (e.g., travel history, exposures)      N/A  Protocols used: [place]

## 2018-10-03 ENCOUNTER — Other Ambulatory Visit: Payer: Self-pay

## 2018-10-03 ENCOUNTER — Telehealth (INDEPENDENT_AMBULATORY_CARE_PROVIDER_SITE_OTHER): Payer: Medicare Other | Admitting: Internal Medicine

## 2018-10-03 DIAGNOSIS — I5042 Chronic combined systolic (congestive) and diastolic (congestive) heart failure: Secondary | ICD-10-CM | POA: Diagnosis not present

## 2018-10-03 DIAGNOSIS — R0602 Shortness of breath: Secondary | ICD-10-CM

## 2018-10-03 DIAGNOSIS — I480 Paroxysmal atrial fibrillation: Secondary | ICD-10-CM

## 2018-10-03 MED ORDER — POTASSIUM CHLORIDE CRYS ER 20 MEQ PO TBCR: 20.0000 meq | EXTENDED_RELEASE_TABLET | Freq: Every day | ORAL | 0 refills | Status: DC

## 2018-10-03 NOTE — Progress Notes (Signed)
Virtual Visit via Telephone Note  I connected with Charles Lloyd. on 10/03/18 at  7:30 AM EDT by telephone and verified that I am speaking with the correct person using two identifiers.   I discussed the limitations, risks, security and privacy concerns of performing an evaluation and management service by telephone and the availability of in person appointments. I also discussed with the patient that there may be a patient responsible charge related to this service. The patient expressed understanding and agreed to proceed.  Location patient: home Location provider: work office Participants present for the call: patient, provider Patient did not have a visit in the prior 7 days to address this/these issue(s).   History of Present Illness:  He has scheduled this acute visit to discuss shortness of breath.  He was seen by another provider in this office 10 days ago for the same symptoms.  Chest x-ray was done that was negative, labs showed a BNP of 289, it was thought he had heart failure and was asked to double up on his Lasix for 3 days which he did.  He states he felt much better after that but his symptoms have returned. He states that he spends the whole day just fine, goes to bed and wakes up at around 3 AM every morning "feeling like I am drowning", he has been coughing with copious production of a white sticky phlegm, "looks like my sputum has bubbles in it".  He has gained about 8 pounds in the past 2 months.  He also wonders whether he is back in A. fib as his heart rate has been irregular.  He has a pulse oximeter at home and his heart rate has been in the 60s with O2 sats in the mid 90s even during these episodes.  He denies fever, headache, nausea, vomiting or any other URI symptoms.   Observations/Objective: Patient sounds cheerful and well on the phone. I do not appreciate any increased work of breathing. Speech and thought processing are grossly intact. Patient reported  vitals: O2 sat of 95% with a heart rate of 60 this a.m.   Current Outpatient Medications:  .  atorvastatin (LIPITOR) 20 MG tablet, TAKE 1 TABLET DAILY AT 6 P.M., Disp: 90 tablet, Rfl: 1 .  diltiazem (CARDIZEM CD) 360 MG 24 hr capsule, Take 1 capsule (360 mg total) by mouth daily., Disp: 90 capsule, Rfl: 1 .  ELIQUIS 5 MG TABS tablet, TAKE 1 TABLET TWICE A DAY, Disp: 180 tablet, Rfl: 1 .  fluticasone furoate-vilanterol (BREO ELLIPTA) 100-25 MCG/INH AEPB, INHALE 1 PUFF BY MOUTH DAILY, Disp: , Rfl:  .  furosemide (LASIX) 20 MG tablet, Take 1 tablet (20 mg total) by mouth 2 (two) times daily., Disp: 180 tablet, Rfl: 1 .  gabapentin (NEURONTIN) 300 MG capsule, Take 2 capsules (600 mg total) by mouth at bedtime., Disp: 180 capsule, Rfl: 1 .  glucose blood ([employer] TEST STRIPS) test strip, Test 2x dailyDx: type 2 diabetes, uncontrolledfluctuating blood sugars, Disp: 100 each, Rfl: 0 .  glucose blood test strip, Use to test blood sugars once daily as directed Dx:E11.65, Disp: , Rfl:  .  lisinopril (PRINIVIL,ZESTRIL) 10 MG tablet, Take by mouth daily. , Disp: , Rfl:  .  metFORMIN (GLUCOPHAGE) 500 MG tablet, Take 1 tablet (500 mg total) by mouth 2 (two) times daily with a meal., Disp: 180 tablet, Rfl: 1 .  metoprolol tartrate (LOPRESSOR) 50 MG tablet, Take by mouth 2 (two) times daily. , Disp: ,  Rfl:  .  mometasone (NASONEX) 50 MCG/ACT nasal spray, Use as directed twice daily., Disp: , Rfl:  .  montelukast (SINGULAIR) 10 MG tablet, Take by mouth at bedtime. , Disp: , Rfl:  .  potassium chloride SA (K-DUR) 20 MEQ tablet, Take 1 tablet (20 mEq total) by mouth 2 (two) times daily., Disp: 8 tablet, Rfl: 0 .  potassium chloride SA (K-DUR) 20 MEQ tablet, Take 1 tablet (20 mEq total) by mouth daily for 7 days., Disp: 7 tablet, Rfl: 0 .  Vitamin D, Ergocalciferol, (DRISDOL) 1.25 MG (50000 UT) CAPS capsule, Take 1 capsule (50,000 Units total) by mouth every 7 (seven) days for 12 doses., Disp: 12 capsule, Rfl: 0   Review of Systems:  Constitutional: Denies fever, chills, diaphoresis, appetite change and fatigue.  HEENT: Denies photophobia, eye pain, redness, hearing loss, ear pain, congestion, sore throat, rhinorrhea, sneezing, mouth sores, trouble swallowing, neck pain, neck stiffness and tinnitus.   Respiratory: Denies  chest tightness. Cardiovascular: Denies chest pain, palpitations and leg swelling.  Gastrointestinal: Denies nausea, vomiting, abdominal pain, diarrhea, constipation, blood in stool and abdominal distention.  Genitourinary: Denies dysuria, urgency, frequency, hematuria, flank pain and difficulty urinating.  Endocrine: Denies: hot or cold intolerance, sweats, changes in hair or nails, polyuria, polydipsia. Musculoskeletal: Denies myalgias, back pain, joint swelling, arthralgias and gait problem.  Skin: Denies pallor, rash and wound.  Neurological: Denies dizziness, seizures, syncope, weakness, light-headedness, numbness and headaches.  Hematological: Denies adenopathy. Easy bruising, personal or family bleeding history  Psychiatric/Behavioral: Denies suicidal ideation, mood changes, confusion, nervousness, sleep disturbance and agitation   Assessment and Plan:  SOB (shortness of breath)  Paroxysmal atrial fibrillation (HCC) Acute on chronic combined systolic and diastolic heart failure ([place])   -I suspect this is likely acute heart failure exacerbation. -Have advised to increase Lasix to 40 mg for now, will send prescription for 20 mEq of potassium for him to take daily. -Will ask for urgent cardiology referral for him to be seen in the next week. -He has had an A. fib ablation in the past, I wonder if he is back in A. fib and this is throwing him into acute heart failure. -To be extra cautious, will send for COVID testing, although I doubt this is the situation. -If he worsens over the weekend or notices hypoxemia, I have advised ED visit for further evaluation.    I discussed  the assessment and treatment plan with the patient. The patient was provided an opportunity to ask questions and all were answered. The patient agreed with the plan and demonstrated an understanding of the instructions.   The patient was advised to call back or seek an in-person evaluation if the symptoms worsen or if the condition fails to improve as anticipated.  I provided 23 minutes of non-face-to-face time during this encounter.   Lelon Frohlich, MD [facility]

## 2018-10-05 NOTE — H&P (View-Only) (Signed)
Cardiology Office Note:    Date:  10/06/2018   ID:  [name], DOB 11/07/1940, MRN [id number]  PCP:  Isaac Bliss, [name], MD  Cardiologist:  [name], MD    Referring MD: Isaac Bliss, Estel*    ASSESSMENT:    1. Persistent atrial fibrillation   2. Chronic combined systolic and diastolic heart failure ([place])   3. Hypertensive heart disease with heart failure ([place])   4. Chronic anticoagulation   5. Hypercholesterolemia    PLAN:    In order of problems listed above:  1. He is in atrial fibrillation with decompensated heart failure, appointment set up for cardioversion and will do the source 2 coronavirus test that was recommended but never performed by his PCP and recheck renal function potassium proBNP level and CBC prior to procedure.  If successful please initiate an antiarrhythmic drug amiodarone 20 mg twice daily.  He will continue his current anticoagulant including the day of procedure and has missed no doses.  For now continue beta-blocker calcium channel blocker for rate control. 2. Decompensated keep him on a higher dose of furosemide after cardioversion I will be setting him up for echocardiogram and myocardial perfusion study 3. Continue current medical therapy with an ACE inhibitor 4. Continue anticoagulant 5. Continue statin 6.    Next appointment: 3 weeks   Medication Adjustments/Labs and Tests Ordered: Current medicines are reviewed at length with the patient today.  Concerns regarding medicines are outlined above.  Orders Placed This Encounter  Procedures  . EKG 12-Lead   No orders of the defined types were placed in this encounter.   Chief Complaint  Patient presents with  . Follow-up  . Congestive Heart Failure  . Atrial Fibrillation    History of Present Illness:    Charles Lloyd. is a 78 y.o. male with a hx of paroxysmal  atrial fibrillation with initial ablation in 2015 at [facility], subsequently had cardioversion in 2017  and again repeat electrical cardioversion in September 2019, heart failure EF 40%, hypertension and hyperlipidemia. He is maintained on chronic anticoagulation with Eliquis, he is rate controlled on Cardizem and metoprolol.   He was last seen 03/10/2018. Compliance with diet, lifestyle and medications: Yes, he does not weigh daily but thinks is up in the range of 8 pounds and has noticed pulse appears irregular and oximetry.  He is not doing well for weeks he has where he goes to bed wakes up 2 or 3 in the morning cannot breathe sits on the side of the bed coughs up frothy sputum wheezes and has chest pressure been seen twice in his PCP office they increased his diuretics did a chest x-ray to told him was good.  He thinks from his pulse oximetry is back in atrial fibrillation he has had no daytime symptoms palpitation or syncope.  Chest x-ray 09/22/2018 no acute abnormality.  proBNP level was low to 89 Past Medical History:  Diagnosis Date  . Atrial fibrillation ([place])   . B12 deficiency   . 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 Cancer ([place])   . CHF (congestive heart failure) ([place]) 04/16/2013   Functional class II, ejection fraction 35-40%  . Chronic anticoagulation   . Chronic diastolic (congestive) heart failure (HCC) 04/16/2013   Functional class II, ejection fraction 35-40%  Last Assessment & Plan:  Clinically stable Volume well controlled meds reviewed  . Chronic diastolic CHF (congestive heart failure) ([place])   . Colon  polyps 04/16/2013  . Diabetes mellitus without complication ([place])   . Diabetic neuropathy ([place])   . Essential hypertension 01/06/2010   Last Assessment & Plan:  Well controlled Continue med management  . 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  Steroid usage was discontinued several months ago.  Follow up with Dermatology as planned.  Marland Kitchen Heart murmur   . Hypercholesterolemia 01/06/2010   Last  Assessment & Plan:  Repeat labs recommended  . Hyperlipidemia   . Hyperlipidemia associated with type 2 diabetes mellitus ([place]) 01/28/2018  . Hypertension   . Infection of prosthetic right knee joint ([place])   . 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 On amiodarone therapy 09/07/2013  . On continuous oral anticoagulation 01/28/2018  . Overweight (BMI 25.0-29.9) 10/22/2016  . Persistent atrial fibrillation 04/16/2013   Last Assessment & Plan:  Rate controlled Continue med management eliquis for stroke preventino  Formatting of this note might be different from the original.  Drug  HX Current Rx Pre-ABL inefficacy Pre-ABL intolerant Post-ABL inefficacy Post-ABL intolerant max dose/24h M/Y end comments  sotalol                  dofetilide                  flecainide                  propafenone                  am  . S/P TKR (total knee replacement), bilateral   . Tinea cruris 04/16/2013  . Type 2 diabetes mellitus with diabetic polyneuropathy, without long-term current use of insulin ([place]) 04/16/2013   Last Assessment & Plan:  Labs today Pt reports well controlled on ambulatory monitoring    Past Surgical History:  Procedure Laterality Date  . CARDIAC ELECTROPHYSIOLOGY STUDY AND ABLATION    . CARDIOVERSION    . CHOLECYSTECTOMY    . PROSTATECTOMY    . REPLACEMENT TOTAL KNEE BILATERAL      Current Medications: Current Meds  Medication Sig  . atorvastatin (LIPITOR) 20 MG tablet TAKE 1 TABLET DAILY AT 6 P.M.  . cyanocobalamin (,VITAMIN B-12,) 1000 MCG/ML injection Inject 1,000 mcg into the muscle every 30 (thirty) days.  Marland Kitchen diltiazem (CARDIZEM CD) 360 MG 24 hr capsule Take 1 capsule (360 mg total) by mouth daily.  Marland Kitchen ELIQUIS 5 MG TABS tablet TAKE 1 TABLET TWICE A DAY  . furosemide (LASIX) 20 MG tablet Take 20 mg by mouth as directed. Take 2 tablets in the am and 1 tablet in pm   . gabapentin (NEURONTIN) 300 MG capsule Take 2 capsules (600 mg total) by mouth at bedtime.  Marland Kitchen glucose blood ([employer] TEST STRIPS) test strip Test 2x dailyDx: type 2 diabetes, uncontrolledfluctuating blood sugars  . glucose blood test strip Use to test blood sugars once daily as directed Dx:E11.65  . lisinopril (PRINIVIL,ZESTRIL) 10 MG tablet Take 10 mg by mouth daily.   . metFORMIN (GLUCOPHAGE) 500 MG tablet Take 1 tablet (500 mg total) by mouth 2 (two) times daily with a meal.  . metoprolol tartrate (LOPRESSOR) 50 MG tablet Take 50 mg by mouth 2 (two) times daily.   . mometasone (NASONEX) 50 MCG/ACT nasal spray Use as directed twice daily.  Marland Kitchen  montelukast (SINGULAIR) 10 MG tablet Take 10 mg by mouth at bedtime.   . potassium chloride SA (K-DUR) 20 MEQ tablet Take 1 tablet (20 mEq total) by mouth daily for 7 days.  . Vitamin D, Ergocalciferol, (DRISDOL) 1.25 MG (50000 UT) CAPS capsule Take 1 capsule (50,000 Units total) by mouth every 7 (seven) days for 12 doses.  . [DISCONTINUED] furosemide (LASIX) 20 MG tablet Take 1 tablet (20 mg total) by mouth 2 (two) times daily.     Allergies:   Patient has no known allergies.   Social History   Socioeconomic History  . Marital status: Widowed    Spouse name: Not on file  . Number of children: Not on file  . Years of education: Not on file  . Highest education level: Not on file  Occupational History  . Not on file  Social Needs  . Financial resource strain: Not on file  . Food insecurity    Worry: Not on file    Inability: Not on file  . Transportation needs    Medical: Not on file    Non-medical: Not on file  Tobacco Use  . Smoking status: Never Smoker  . Smokeless tobacco: Never Used  Substance and Sexual Activity  . Alcohol use: Yes    Alcohol/week: 2.0 standard drinks    Types: 2 Glasses of wine per week    Comment: daily  . Drug use: Never  . Sexual activity: Not Currently  Lifestyle  . Physical activity    Days per week: Not  on file    Minutes per session: Not on file  . Stress: Not on file  Relationships  . Social Herbalist on phone: Not on file    Gets together: Not on file    Attends religious service: Not on file    Active member of club or organization: Not on file    Attends meetings of clubs or organizations: Not on file    Relationship status: Not on file  Other Topics Concern  . Not on file  Social History Narrative  . Not on file     Family History: The patient's family history includes Cancer in his father and mother; Depression in his father and mother; Early death in his father and mother. ROS:   Please see the history of present illness.    All other systems reviewed and are negative.  EKGs/Labs/Other Studies Reviewed:    The following studies were reviewed today:  EKG:  EKG ordered today and personally reviewed.  The ekg ordered today demonstrates atrial fibrillation controlled rate  Recent Labs: 07/24/2018: ALT 16; TSH 0.87 09/22/2018: BUN 20; Creatinine, Ser 1.02; Hemoglobin 12.0; Platelets 178.0; Potassium 4.0; Pro B Natriuretic peptide (BNP) 289.0; Sodium 141  Recent Lipid Panel    Component Value Date/Time   CHOL 141 07/24/2018 1058   TRIG 125.0 07/24/2018 1058   HDL 44.00 07/24/2018 1058   CHOLHDL 3 07/24/2018 1058   VLDL 25.0 07/24/2018 1058   LDLCALC 72 07/24/2018 1058    Physical Exam:    VS:  BP 122/80 (BP Location: Left Arm, Patient Position: Sitting, Cuff Size: Normal)   Pulse 84   Ht 5' 9.5" (1.765 m)   Wt 187 lb (84.8 kg)   SpO2 95%   BMI 27.22 kg/m     Wt Readings from Last 3 Encounters:  10/06/18 187 lb (84.8 kg)  07/24/18 184 lb 4.8 oz (83.6 kg)  03/10/18 177 lb 10 oz (80.6  kg)     GEN:  Well nourished, well developed in no acute distress HEENT: Normal NECK: No JVD; No carotid bruits LYMPHATICS: No lymphadenopathy CARDIAC: Irregular rhythm variable first heart sound chest clear RESPIRATORY: Rhonchi and expiratory wheezing ABDOMEN:  Soft, non-tender, non-distended MUSCULOSKELETAL: 2+ bilateral to the knee edema; No deformity  SKIN: Warm and dry NEUROLOGIC:  Alert and oriented x 3 PSYCHIATRIC:  Normal affect    Signed, [name], MD  10/06/2018 8:49 AM    [facility]

## 2018-10-05 NOTE — Progress Notes (Signed)
Cardiology Office Note:    Date:  10/06/2018   ID:  Charles Gum., DOB 04-15-1940, MRN [id number]  PCP:  Charles Lloyd, Charles Halsted, MD  Cardiologist:  Charles More, MD    Referring MD: Charles Lloyd, Estel*    ASSESSMENT:    1. Persistent atrial fibrillation   2. Chronic combined systolic and diastolic heart failure ([place])   3. Hypertensive heart disease with heart failure ([place])   4. Chronic anticoagulation   5. Hypercholesterolemia    PLAN:    In order of problems listed above:  1. He is in atrial fibrillation with decompensated heart failure, appointment set up for cardioversion and will do the source 2 coronavirus test that was recommended but never performed by his PCP and recheck renal function potassium proBNP level and CBC prior to procedure.  If successful please initiate an antiarrhythmic drug amiodarone 20 mg twice daily.  He will continue his current anticoagulant including the day of procedure and has missed no doses.  For now continue beta-blocker calcium channel blocker for rate control. 2. Decompensated keep him on a higher dose of furosemide after cardioversion I will be setting him up for echocardiogram and myocardial perfusion study 3. Continue current medical therapy with an ACE inhibitor 4. Continue anticoagulant 5. Continue statin 6.    Next appointment: 3 weeks   Medication Adjustments/Labs and Tests Ordered: Current medicines are reviewed at length with the patient today.  Concerns regarding medicines are outlined above.  Orders Placed This Encounter  Procedures  . EKG 12-Lead   No orders of the defined types were placed in this encounter.   Chief Complaint  Patient presents with  . Follow-up  . Congestive Heart Failure  . Atrial Fibrillation    History of Present Illness:    Charles Brannen. is a 78 y.o. male with a hx of paroxysmal  atrial fibrillation with initial ablation in 2015 at [facility], subsequently had cardioversion in 2017  and again repeat electrical cardioversion in September 2019, heart failure EF 40%, hypertension and hyperlipidemia. He is maintained on chronic anticoagulation with Eliquis, he is rate controlled on Cardizem and metoprolol.   He was last seen 03/10/2018. Compliance with diet, lifestyle and medications: Yes, he does not weigh daily but thinks is up in the range of 8 pounds and has noticed pulse appears irregular and oximetry.  He is not doing well for weeks he has where he goes to bed wakes up 2 or 3 in the morning cannot breathe sits on the side of the bed coughs up frothy sputum wheezes and has chest pressure been seen twice in his PCP office they increased his diuretics did a chest x-ray to told him was good.  He thinks from his pulse oximetry is back in atrial fibrillation he has had no daytime symptoms palpitation or syncope.  Chest x-ray 09/22/2018 no acute abnormality.  proBNP level was low to 89 Past Medical History:  Diagnosis Date  . Atrial fibrillation ([employer])   . B12 deficiency   . 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 Cancer ([place])   . CHF (congestive heart failure) ([place]) 04/16/2013   Functional class II, ejection fraction 35-40%  . Chronic anticoagulation   . Chronic diastolic (congestive) heart failure (H[facility]) 04/16/2013   Functional class II, ejection fraction 35-40%  Last Assessment & Plan:  Clinically stable Volume well controlled meds reviewed  . Chronic diastolic CHF (congestive heart failure) ([place])   . Colon  polyps 04/16/2013  . Diabetes mellitus without complication ([place])   . Diabetic neuropathy ([place])   . Essential hypertension 01/06/2010   Last Assessment & Plan:  Well controlled Continue med management  . 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  Steroid usage was discontinued several months ago.  Follow up with Dermatology as planned.  Marland Kitchen Heart murmur   . Hypercholesterolemia 01/06/2010   Last  Assessment & Plan:  Repeat labs recommended  . Hyperlipidemia   . Hyperlipidemia associated with type 2 diabetes mellitus ([place]) 01/28/2018  . Hypertension   . Infection of prosthetic right knee joint ([place])   . 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 On amiodarone therapy 09/07/2013  . On continuous oral anticoagulation 01/28/2018  . Overweight (BMI 25.0-29.9) 10/22/2016  . Persistent atrial fibrillation 04/16/2013   Last Assessment & Plan:  Rate controlled Continue med management eliquis for stroke preventino  Formatting of this note might be different from the original.  Drug  HX Current Rx Pre-ABL inefficacy Pre-ABL intolerant Post-ABL inefficacy Post-ABL intolerant max dose/24h M/Y end comments  sotalol                  dofetilide                  flecainide                  propafenone                  am  . S/P TKR (total knee replacement), bilateral   . Tinea cruris 04/16/2013  . Type 2 diabetes mellitus with diabetic polyneuropathy, without long-term current use of insulin ([place]) 04/16/2013   Last Assessment & Plan:  Labs today Pt reports well controlled on ambulatory monitoring    Past Surgical History:  Procedure Laterality Date  . CARDIAC ELECTROPHYSIOLOGY STUDY AND ABLATION    . CARDIOVERSION    . CHOLECYSTECTOMY    . PROSTATECTOMY    . REPLACEMENT TOTAL KNEE BILATERAL      Current Medications: Current Meds  Medication Sig  . atorvastatin (LIPITOR) 20 MG tablet TAKE 1 TABLET DAILY AT 6 P.M.  . cyanocobalamin (,VITAMIN B-12,) 1000 MCG/ML injection Inject 1,000 mcg into the muscle every 30 (thirty) days.  Marland Kitchen diltiazem (CARDIZEM CD) 360 MG 24 hr capsule Take 1 capsule (360 mg total) by mouth daily.  Marland Kitchen ELIQUIS 5 MG TABS tablet TAKE 1 TABLET TWICE A DAY  . furosemide (LASIX) 20 MG tablet Take 20 mg by mouth as directed. Take 2 tablets in the am and 1 tablet in pm   . gabapentin (NEURONTIN) 300 MG capsule Take 2 capsules (600 mg total) by mouth at bedtime.  Marland Kitchen glucose blood ([employer] TEST STRIPS) test strip Test 2x dailyDx: type 2 diabetes, uncontrolledfluctuating blood sugars  . glucose blood test strip Use to test blood sugars once daily as directed Dx:E11.65  . lisinopril (PRINIVIL,ZESTRIL) 10 MG tablet Take 10 mg by mouth daily.   . metFORMIN (GLUCOPHAGE) 500 MG tablet Take 1 tablet (500 mg total) by mouth 2 (two) times daily with a meal.  . metoprolol tartrate (LOPRESSOR) 50 MG tablet Take 50 mg by mouth 2 (two) times daily.   . mometasone (NASONEX) 50 MCG/ACT nasal spray Use as directed twice daily.  Marland Kitchen  montelukast (SINGULAIR) 10 MG tablet Take 10 mg by mouth at bedtime.   . potassium chloride SA (K-DUR) 20 MEQ tablet Take 1 tablet (20 mEq total) by mouth daily for 7 days.  . Vitamin D, Ergocalciferol, (DRISDOL) 1.25 MG (50000 UT) CAPS capsule Take 1 capsule (50,000 Units total) by mouth every 7 (seven) days for 12 doses.  . [DISCONTINUED] furosemide (LASIX) 20 MG tablet Take 1 tablet (20 mg total) by mouth 2 (two) times daily.     Allergies:   Patient has no known allergies.   Social History   Socioeconomic History  . Marital status: Widowed    Spouse name: Not on file  . Number of children: Not on file  . Years of education: Not on file  . Highest education level: Not on file  Occupational History  . Not on file  Social Needs  . Financial resource strain: Not on file  . Food insecurity    Worry: Not on file    Inability: Not on file  . Transportation needs    Medical: Not on file    Non-medical: Not on file  Tobacco Use  . Smoking status: Never Smoker  . Smokeless tobacco: Never Used  Substance and Sexual Activity  . Alcohol use: Yes    Alcohol/week: 2.0 standard drinks    Types: 2 Glasses of wine per week    Comment: daily  . Drug use: Never  . Sexual activity: Not Currently  Lifestyle  . Physical activity    Days per week: Not  on file    Minutes per session: Not on file  . Stress: Not on file  Relationships  . Social Herbalist on phone: Not on file    Gets together: Not on file    Attends religious service: Not on file    Active member of club or organization: Not on file    Attends meetings of clubs or organizations: Not on file    Relationship status: Not on file  Other Topics Concern  . Not on file  Social History Narrative  . Not on file     Family History: The patient's family history includes Cancer in his father and mother; Depression in his father and mother; Early death in his father and mother. ROS:   Please see the history of present illness.    All other systems reviewed and are negative.  EKGs/Labs/Other Studies Reviewed:    The following studies were reviewed today:  EKG:  EKG ordered today and personally reviewed.  The ekg ordered today demonstrates atrial fibrillation controlled rate  Recent Labs: 07/24/2018: ALT 16; TSH 0.87 09/22/2018: BUN 20; Creatinine, Ser 1.02; Hemoglobin 12.0; Platelets 178.0; Potassium 4.0; Pro B Natriuretic peptide (BNP) 289.0; Sodium 141  Recent Lipid Panel    Component Value Date/Time   CHOL 141 07/24/2018 1058   TRIG 125.0 07/24/2018 1058   HDL 44.00 07/24/2018 1058   CHOLHDL 3 07/24/2018 1058   VLDL 25.0 07/24/2018 1058   LDLCALC 72 07/24/2018 1058    Physical Exam:    VS:  BP 122/80 (BP Location: Left Arm, Patient Position: Sitting, Cuff Size: Normal)   Pulse 84   Ht 5' 9.5" (1.765 m)   Wt 187 lb (84.8 kg)   SpO2 95%   BMI 27.22 kg/m     Wt Readings from Last 3 Encounters:  10/06/18 187 lb (84.8 kg)  07/24/18 184 lb 4.8 oz (83.6 kg)  03/10/18 177 lb 10 oz (80.6  kg)     GEN:  Well nourished, well developed in no acute distress HEENT: Normal NECK: No JVD; No carotid bruits LYMPHATICS: No lymphadenopathy CARDIAC: Irregular rhythm variable first heart sound chest clear RESPIRATORY: Rhonchi and expiratory wheezing ABDOMEN:  Soft, non-tender, non-distended MUSCULOSKELETAL: 2+ bilateral to the knee edema; No deformity  SKIN: Warm and dry NEUROLOGIC:  Alert and oriented x 3 PSYCHIATRIC:  Normal affect    Signed, Charles More, MD  10/06/2018 8:49 AM    [facility]

## 2018-10-06 ENCOUNTER — Other Ambulatory Visit: Payer: Self-pay

## 2018-10-06 ENCOUNTER — Other Ambulatory Visit (HOSPITAL_COMMUNITY)
Admission: RE | Admit: 2018-10-06 | Discharge: 2018-10-06 | Disposition: A | Discharge status: Home / Self Care | Payer: Medicare Other | Source: Ambulatory Visit | Attending: Cardiovascular Disease | Admitting: Cardiovascular Disease

## 2018-10-06 ENCOUNTER — Telehealth: Payer: Self-pay | Admitting: Cardiology

## 2018-10-06 ENCOUNTER — Ambulatory Visit (INDEPENDENT_AMBULATORY_CARE_PROVIDER_SITE_OTHER): Payer: Medicare Other | Admitting: Cardiology

## 2018-10-06 ENCOUNTER — Encounter: Payer: Self-pay | Admitting: Cardiology

## 2018-10-06 VITALS — BP 122/80 | HR 84 | Ht 69.5 in | Wt 187.0 lb

## 2018-10-06 DIAGNOSIS — Z20828 Contact with and (suspected) exposure to other viral communicable diseases: Secondary | ICD-10-CM | POA: Insufficient documentation

## 2018-10-06 DIAGNOSIS — I5042 Chronic combined systolic (congestive) and diastolic (congestive) heart failure: Secondary | ICD-10-CM

## 2018-10-06 DIAGNOSIS — I110 Hypertensive heart disease with heart failure: Secondary | ICD-10-CM | POA: Diagnosis not present

## 2018-10-06 DIAGNOSIS — R0602 Shortness of breath: Secondary | ICD-10-CM

## 2018-10-06 DIAGNOSIS — I4819 Other persistent atrial fibrillation: Secondary | ICD-10-CM | POA: Diagnosis not present

## 2018-10-06 DIAGNOSIS — Z01812 Encounter for preprocedural laboratory examination: Secondary | ICD-10-CM | POA: Diagnosis present

## 2018-10-06 DIAGNOSIS — Z7901 Long term (current) use of anticoagulants: Secondary | ICD-10-CM | POA: Diagnosis not present

## 2018-10-06 DIAGNOSIS — E7800 Pure hypercholesterolemia, unspecified: Secondary | ICD-10-CM

## 2018-10-06 LAB — SARS CORONAVIRUS 2 (TAT 6-24 HRS): SARS Coronavirus 2: NEGATIVE

## 2018-10-06 MED ORDER — FUROSEMIDE 40 MG PO TABS: 40.0000 mg | ORAL_TABLET | Freq: Two times a day (BID) | ORAL | 3 refills | Status: DC

## 2018-10-06 NOTE — Patient Instructions (Addendum)
Medication Instructions:  Your physician has recommended you make the following change in your medication:  INCREASE furosemide (lasix) 40 mg: Take 1 tablet twice daily  If you need a refill on your cardiac medications before your next appointment, please call your pharmacy.   Lab work: Your physician recommends that you return for lab work today: CBC, BMP, ProBNP.   If you have labs (blood work) drawn today and your tests are completely normal, you will receive your results only by: Marland Kitchen MyChart Message (if you have MyChart) OR . A paper copy in the mail If you have any lab test that is abnormal or we need to change your treatment, we will call you to review the results.  Testing/Procedures: You had an EKG today.  Your physician has requested that you have an echocardiogram. Echocardiography is a painless test that uses sound waves to create images of your heart. It provides your doctor with information about the size and shape of your heart and how well your heart's chambers and valves are working. This procedure takes approximately one hour. There are no restrictions for this procedure.  You will go to the [facility] for COVID testing today at 1:35 pm. Please arrive 15 minutes early. You will go to [place], [place] [address]. DO NOT go through the tent line! Go to the building overhang for testing.    You are scheduled for a Cardioversion on Friday, 10/10/2018 with Dr. Johnsie Cancel.  Please arrive at the [facility] (Main Entrance A) at [facility]: [address] [place], [place] [address] at 8:30 am.   DIET: Nothing to eat or drink after midnight except a sip of water with medications (see medication instructions below)  Medication Instructions: Hold furosemide (lasix)  Continue your anticoagulant: eliquis You will need to continue your anticoagulant after your procedure until you are told by your Provider that it is safe to stop   You must have a  responsible person to drive you home and stay in the waiting area during your procedure. Failure to do so could result in cancellation.  Bring your insurance cards.  *Special Note: Every effort is made to have your procedure done on time. Occasionally there are emergencies that occur at the hospital that may cause delays. Please be patient if a delay does occur.    Follow-Up: At [facility], you and your health needs are our priority.  As part of our continuing mission to provide you with exceptional heart care, we have created designated Provider Care Teams.  These Care Teams include your primary Cardiologist (physician) and Advanced Practice Providers (APPs -  Physician Assistants and Nurse Practitioners) who all work together to provide you with the care you need, when you need it. You will need a follow up appointment in 1 months.

## 2018-10-07 ENCOUNTER — Telehealth: Payer: Self-pay | Admitting: Cardiology

## 2018-10-07 ENCOUNTER — Ambulatory Visit (HOSPITAL_COMMUNITY)
Admission: RE | Admit: 2018-10-07 | Discharge: 2018-10-07 | Disposition: A | Discharge status: Home / Self Care | Payer: Medicare Other | Source: Ambulatory Visit | Attending: Cardiology | Admitting: Cardiology

## 2018-10-07 DIAGNOSIS — I5042 Chronic combined systolic (congestive) and diastolic (congestive) heart failure: Secondary | ICD-10-CM | POA: Diagnosis not present

## 2018-10-07 DIAGNOSIS — I4819 Other persistent atrial fibrillation: Secondary | ICD-10-CM | POA: Insufficient documentation

## 2018-10-07 DIAGNOSIS — E119 Type 2 diabetes mellitus without complications: Secondary | ICD-10-CM | POA: Insufficient documentation

## 2018-10-07 DIAGNOSIS — I110 Hypertensive heart disease with heart failure: Secondary | ICD-10-CM | POA: Insufficient documentation

## 2018-10-07 DIAGNOSIS — R0602 Shortness of breath: Secondary | ICD-10-CM

## 2018-10-07 DIAGNOSIS — E785 Hyperlipidemia, unspecified: Secondary | ICD-10-CM | POA: Insufficient documentation

## 2018-10-07 LAB — CBC
Hematocrit: 38.8 % (ref 37.5–51.0)
Hemoglobin: 12.3 g/dL — ABNORMAL LOW (ref 13.0–17.7)
MCH: 28.1 pg (ref 26.6–33.0)
MCHC: 31.7 g/dL (ref 31.5–35.7)
MCV: 89 fL (ref 79–97)
Platelets: 177 10*3/uL (ref 150–450)
RBC: 4.38 x10E6/uL (ref 4.14–5.80)
RDW: 14.7 % (ref 11.6–15.4)
WBC: 5.7 10*3/uL (ref 3.4–10.8)

## 2018-10-07 LAB — BASIC METABOLIC PANEL
BUN/Creatinine Ratio: 12 (ref 10–24)
BUN: 12 mg/dL (ref 8–27)
CO2: 25 mmol/L (ref 20–29)
Calcium: 9.3 mg/dL (ref 8.6–10.2)
Chloride: 96 mmol/L (ref 96–106)
Creatinine, Ser: 1.03 mg/dL (ref 0.76–1.27)
GFR calc Af Amer: 82 mL/min/{1.73_m2} (ref >59)
GFR calc non Af Amer: 71 mL/min/{1.73_m2} (ref >59)
Glucose: 132 mg/dL — ABNORMAL HIGH (ref 65–99)
Potassium: 3.7 mmol/L (ref 3.5–5.2)
Sodium: 137 mmol/L (ref 134–144)

## 2018-10-07 LAB — PRO B NATRIURETIC PEPTIDE: NT-Pro BNP: 957 pg/mL — ABNORMAL HIGH (ref 0–486)

## 2018-10-07 MED ORDER — POTASSIUM CHLORIDE CRYS ER 20 MEQ PO TBCR: 20.0000 meq | EXTENDED_RELEASE_TABLET | Freq: Every day | ORAL | 3 refills | Status: DC

## 2018-10-07 NOTE — Progress Notes (Signed)
  Echocardiogram 2D Echocardiogram has been performed.  Charles Lloyd 10/07/2018, 2:44 PM

## 2018-10-07 NOTE — Telephone Encounter (Signed)
Patient advised to resume taking potassium chloride 20 mEq daily as PCP originally prescribed. Prescription sent to [facility] in [place] as requested. Patient is agreeable and verbalized understanding. No further questions.

## 2018-10-07 NOTE — Telephone Encounter (Signed)
Patient was seen for an office visit yesterday, 10/06/2018. His furosemide dose was increased to 40 mg twice daily. Patient wants to know if he should be taking a potassium supplement along with furosemide. Please advise. Thanks!

## 2018-10-07 NOTE — Telephone Encounter (Signed)
Please send Rx for KDur 80meq daily. Thank you!

## 2018-10-07 NOTE — Telephone Encounter (Signed)
Duplicate encounter. Please see most recent encounter.  

## 2018-10-09 NOTE — Addendum Note (Signed)
Addended byShirlee More on: 10/09/2018 10:19 AM   Modules accepted: Orders, SmartSet

## 2018-10-09 NOTE — Anesthesia Preprocedure Evaluation (Addendum)
Anesthesia Evaluation  Patient identified by MRN, date of birth, ID band Patient awake    Reviewed: Allergy & Precautions, NPO status , Patient's Chart, lab work & pertinent test results  Airway Mallampati: II  TM Distance: >3 FB Neck ROM: Full    Dental no notable dental hx. (+) Teeth Intact   Pulmonary neg pulmonary ROS,    Pulmonary exam normal breath sounds clear to auscultation       Cardiovascular hypertension, Pt. on medications Normal cardiovascular exam+ dysrhythmias Atrial Fibrillation  Rhythm:Regular Rate:Normal     Neuro/Psych  Neuromuscular disease negative psych ROS   GI/Hepatic Neg liver ROS,   Endo/Other  diabetes, Type 2  Renal/GU negative Renal ROS     Musculoskeletal negative musculoskeletal ROS (+)   Abdominal   Peds  Hematology   Anesthesia Other Findings   Reproductive/Obstetrics                            Anesthesia Physical Anesthesia Plan  ASA: III  Anesthesia Plan: General   Post-op Pain Management:    Induction:   PONV Risk Score and Plan:   Airway Management Planned: Natural Airway  Additional Equipment:   Intra-op Plan:   Post-operative Plan:   Informed Consent: I have reviewed the patients History and Physical, chart, labs and discussed the procedure including the risks, benefits and alternatives for the proposed anesthesia with the patient or authorized representative who has indicated his/her understanding and acceptance.     Dental advisory given  Plan Discussed with:   Anesthesia Plan Comments:        Anesthesia Quick Evaluation

## 2018-10-09 NOTE — Progress Notes (Signed)
Spoke with patient who states that he has remained in quarantine and is not experiencing and s/s of COVID 19 since his screening. All questions answered appropriately. Jobe Igo, RN

## 2018-10-10 ENCOUNTER — Ambulatory Visit (HOSPITAL_COMMUNITY): Payer: Medicare Other | Admitting: Anesthesiology

## 2018-10-10 ENCOUNTER — Ambulatory Visit (HOSPITAL_COMMUNITY)
Admission: RE | Admit: 2018-10-10 | Discharge: 2018-10-10 | Disposition: A | Discharge status: Home / Self Care | Payer: Medicare Other | Attending: Cardiovascular Disease | Admitting: Cardiovascular Disease

## 2018-10-10 ENCOUNTER — Other Ambulatory Visit: Payer: Self-pay

## 2018-10-10 ENCOUNTER — Ambulatory Visit: Payer: Medicare Other

## 2018-10-10 ENCOUNTER — Encounter (HOSPITAL_COMMUNITY)
Admission: RE | Disposition: A | Discharge status: Home / Self Care | Payer: Self-pay | Source: Home / Self Care | Attending: Cardiovascular Disease

## 2018-10-10 ENCOUNTER — Encounter (HOSPITAL_COMMUNITY): Payer: Self-pay

## 2018-10-10 DIAGNOSIS — I4819 Other persistent atrial fibrillation: Secondary | ICD-10-CM | POA: Insufficient documentation

## 2018-10-10 DIAGNOSIS — Z79899 Other long term (current) drug therapy: Secondary | ICD-10-CM | POA: Diagnosis not present

## 2018-10-10 DIAGNOSIS — Z8673 Personal history of transient ischemic attack (TIA), and cerebral infarction without residual deficits: Secondary | ICD-10-CM | POA: Diagnosis not present

## 2018-10-10 DIAGNOSIS — I4891 Unspecified atrial fibrillation: Secondary | ICD-10-CM

## 2018-10-10 DIAGNOSIS — E785 Hyperlipidemia, unspecified: Secondary | ICD-10-CM | POA: Diagnosis not present

## 2018-10-10 DIAGNOSIS — E1169 Type 2 diabetes mellitus with other specified complication: Secondary | ICD-10-CM | POA: Diagnosis not present

## 2018-10-10 DIAGNOSIS — I110 Hypertensive heart disease with heart failure: Secondary | ICD-10-CM | POA: Diagnosis not present

## 2018-10-10 DIAGNOSIS — Z7901 Long term (current) use of anticoagulants: Secondary | ICD-10-CM | POA: Diagnosis not present

## 2018-10-10 DIAGNOSIS — I5042 Chronic combined systolic (congestive) and diastolic (congestive) heart failure: Secondary | ICD-10-CM | POA: Diagnosis not present

## 2018-10-10 DIAGNOSIS — Z9079 Acquired absence of other genital organ(s): Secondary | ICD-10-CM | POA: Insufficient documentation

## 2018-10-10 DIAGNOSIS — E1142 Type 2 diabetes mellitus with diabetic polyneuropathy: Secondary | ICD-10-CM | POA: Diagnosis not present

## 2018-10-10 DIAGNOSIS — Z7984 Long term (current) use of oral hypoglycemic drugs: Secondary | ICD-10-CM | POA: Insufficient documentation

## 2018-10-10 DIAGNOSIS — Z96653 Presence of artificial knee joint, bilateral: Secondary | ICD-10-CM | POA: Diagnosis not present

## 2018-10-10 HISTORY — PX: CARDIOVERSION: SHX1299

## 2018-10-10 LAB — GLUCOSE, CAPILLARY: Glucose-Capillary: 118 mg/dL — ABNORMAL HIGH (ref 70–99)

## 2018-10-10 SURGERY — CARDIOVERSION
Anesthesia: General

## 2018-10-10 MED ORDER — LIDOCAINE 2% (20 MG/ML) 5 ML SYRINGE
INTRAMUSCULAR | Status: DC | PRN
  Administered 2018-10-10: 60 mg via INTRAVENOUS
  Administered 2018-10-10: 40 mg via INTRAVENOUS

## 2018-10-10 MED ORDER — SODIUM CHLORIDE 0.9 % IV SOLN
INTRAVENOUS | Status: DC | PRN
  Administered 2018-10-10: 09:00:00 via INTRAVENOUS

## 2018-10-10 MED ORDER — PROPOFOL 10 MG/ML IV BOLUS
INTRAVENOUS | Status: DC | PRN
  Administered 2018-10-10: 80 mg via INTRAVENOUS
  Administered 2018-10-10: 20 mg via INTRAVENOUS

## 2018-10-10 NOTE — Anesthesia Postprocedure Evaluation (Signed)
Anesthesia Post Note  Patient: Charles Lloyd.  Procedure(s) Performed: CARDIOVERSION (N/A )     Patient location during evaluation: Endoscopy Anesthesia Type: General Level of consciousness: awake and alert Pain management: pain level controlled Vital Signs Assessment: post-procedure vital signs reviewed and stable Respiratory status: spontaneous breathing, nonlabored ventilation, respiratory function stable and patient connected to nasal cannula oxygen Cardiovascular status: blood pressure returned to baseline and stable Postop Assessment: no apparent nausea or vomiting Anesthetic complications: no    Last Vitals:  Vitals:   10/10/18 0943 10/10/18 0953  BP: 91/62 (!) 102/56  Pulse: 65 67  Resp: 20 18  Temp:    SpO2: 92% 93%    Last Pain:  Vitals:   10/10/18 0953  TempSrc:   PainSc: 0-No pain                 Barnet Glasgow

## 2018-10-10 NOTE — Discharge Instructions (Signed)

## 2018-10-10 NOTE — CV Procedure (Signed)
DCC: Rx Eliquis no missed doses Anesthesia:  Propofol  DCC x 4 150 J biphasic x 1 and 200J biphasic x 3 Failed to convert AFib with good rate control 70-80  Will need EP referral  Charles Lloyd

## 2018-10-10 NOTE — Interval H&P Note (Signed)
History and Physical Interval Note:  10/10/2018 8:31 AM  Charles Lloyd.  has presented today for surgery, with the diagnosis of AFIB.  The various methods of treatment have been discussed with the patient and family. After consideration of risks, benefits and other options for treatment, the patient has consented to  Procedure(s): CARDIOVERSION (N/A) as a surgical intervention.  The patient's history has been reviewed, patient examined, no change in status, stable for surgery.  I have reviewed the patient's chart and labs.  Questions were answered to the patient's satisfaction.     Jenkins Rouge

## 2018-10-10 NOTE — Transfer of Care (Signed)
Immediate Anesthesia Transfer of Care Note  Patient: Charles Lloyd.  Procedure(s) Performed: CARDIOVERSION (N/A )  Patient Location: PACU  Anesthesia Type:General  Level of Consciousness: drowsy  Airway & Oxygen Therapy: Patient Spontanous Breathing  Post-op Assessment: Report given to RN and Post -op Vital signs reviewed and stable  Post vital signs: Reviewed and stable  Last Vitals:  Vitals Value Taken Time  BP    Temp    Pulse    Resp    SpO2      Last Pain:  Vitals:   10/10/18 0834  TempSrc: Oral  PainSc: 0-No pain         Complications: No apparent anesthesia complications

## 2018-10-12 ENCOUNTER — Encounter (HOSPITAL_COMMUNITY): Payer: Self-pay | Admitting: Cardiovascular Disease

## 2018-10-13 ENCOUNTER — Other Ambulatory Visit: Payer: Self-pay | Admitting: Internal Medicine

## 2018-10-13 ENCOUNTER — Ambulatory Visit (INDEPENDENT_AMBULATORY_CARE_PROVIDER_SITE_OTHER): Payer: Medicare Other | Admitting: *Deleted

## 2018-10-13 ENCOUNTER — Other Ambulatory Visit: Payer: Self-pay

## 2018-10-13 DIAGNOSIS — E538 Deficiency of other specified B group vitamins: Secondary | ICD-10-CM

## 2018-10-13 MED ORDER — CYANOCOBALAMIN 1000 MCG/ML IJ SOLN
1000.0000 ug | Freq: Once | INTRAMUSCULAR | Status: AC
  Administered 2018-10-13: 1000 ug via INTRAMUSCULAR

## 2018-10-13 NOTE — Progress Notes (Signed)
Patient in office for B-12 injection. Injection administered with no adverse reaction.

## 2018-10-17 ENCOUNTER — Telehealth: Payer: Self-pay | Admitting: *Deleted

## 2018-10-17 DIAGNOSIS — I4819 Other persistent atrial fibrillation: Secondary | ICD-10-CM

## 2018-10-17 NOTE — Telephone Encounter (Signed)
-----   Message from Richardo Priest, MD sent at 10/10/2018 10:58 AM EDT ----- Please refer to Dr. Curt Bears for persistent atrial fibrillation failing cardioversio ----- Message ----- From: [name], MD Sent: 10/10/2018   9:53 AM EDT To: Richardo Priest, MD  Failed to convert will need EP referral

## 2018-10-17 NOTE — Telephone Encounter (Signed)
Patient is agreeable to electrophysiology referral to see Dr. Curt Bears in [facility] for further evaluation of atrial fibrillation per Dr. Bettina Gavia. Referral has been placed. Advised patient that he would receive a phone call to schedule an appointment. Patient verbalized understanding. No further questions.

## 2018-10-27 ENCOUNTER — Ambulatory Visit: Payer: Medicare Other | Admitting: Cardiology

## 2018-10-30 ENCOUNTER — Other Ambulatory Visit: Payer: Self-pay

## 2018-10-30 ENCOUNTER — Other Ambulatory Visit: Payer: Self-pay | Admitting: Internal Medicine

## 2018-10-30 ENCOUNTER — Other Ambulatory Visit (INDEPENDENT_AMBULATORY_CARE_PROVIDER_SITE_OTHER): Payer: Medicare Other

## 2018-10-30 DIAGNOSIS — E1142 Type 2 diabetes mellitus with diabetic polyneuropathy: Secondary | ICD-10-CM

## 2018-10-30 DIAGNOSIS — E559 Vitamin D deficiency, unspecified: Secondary | ICD-10-CM

## 2018-10-30 LAB — VITAMIN D 25 HYDROXY (VIT D DEFICIENCY, FRACTURES): VITD: 56.02 ng/mL (ref 30.00–100.00)

## 2018-10-30 LAB — HEMOGLOBIN A1C: Hgb A1c MFr Bld: 7.2 % — ABNORMAL HIGH (ref 4.6–6.5)

## 2018-11-05 ENCOUNTER — Other Ambulatory Visit: Payer: Self-pay

## 2018-11-05 ENCOUNTER — Encounter: Payer: Self-pay | Admitting: Cardiology

## 2018-11-05 ENCOUNTER — Ambulatory Visit (INDEPENDENT_AMBULATORY_CARE_PROVIDER_SITE_OTHER): Payer: Medicare Other | Admitting: Cardiology

## 2018-11-05 ENCOUNTER — Telehealth: Payer: Self-pay | Admitting: Cardiology

## 2018-11-05 VITALS — BP 122/70 | HR 85 | Ht 70.0 in | Wt 185.0 lb

## 2018-11-05 DIAGNOSIS — I4819 Other persistent atrial fibrillation: Secondary | ICD-10-CM

## 2018-11-05 NOTE — Progress Notes (Signed)
Electrophysiology Office Note   Date:  11/05/2018   ID:  Charles Gum., DOB 10-08-1940, MRN [id number]  PCP:  Isaac Bliss, [name], MD  Cardiologist:  Bettina Gavia Primary Electrophysiologist:  Sapna Padron Meredith Leeds, MD    Chief Complaint: atrial fibrillation initial   History of Present Illness: Charles Kitts. is a 78 y.o. male who is being seen today for the evaluation of atrial fibrillation at the request of [name] Presenting today for electrophysiology evaluation.  He has a history of atrial fibrillation, moderate systolic heart failure, diabetes, hypertension, hyperlipidemia.  He had an ablation in [place] in 2015 with a cardioversion in 2017 and then a repeat cardioversion September 2019.  He is currently on Eliquis and rate controlled with diltiazem and metoprolol.  He has been having significant nighttime shortness of breath where he has to sit on the edge of the bed.  His diuretics have been increased.  He had an attempt at cardioversion 10/10/2018 but unfortunately failed to convert.  Today, he denies symptoms of palpitations, chest pain, shortness of breath, orthopnea, PND, lower extremity edema, claudication, dizziness, presyncope, syncope, bleeding, or neurologic sequela. The patient is tolerating medications without difficulties.  Since his Lasix dose was increased, he has had an improvement in his shortness of breath.  He still has some weakness and fatigue, but he is no longer having any PND symptoms.   Past Medical History:  Diagnosis Date   Atrial fibrillation ([place])    B12 deficiency    Basal cell carcinoma (BCC) of left side of nose 01/28/2018   Cancer (HCC)    CHF (congestive heart failure) ([place]) 04/16/2013   Functional class II, ejection fraction 35-40%   Chronic anticoagulation    Chronic diastolic (congestive) heart failure ([place]) 04/16/2013   Functional class II, ejection fraction 35-40%  Last Assessment & Plan:  Clinically stable Volume well  controlled meds reviewed   Chronic diastolic CHF (congestive heart failure) ([place])    Colon polyps 04/16/2013   Diabetes mellitus without complication ([place])    Diabetic neuropathy ([place])    Essential hypertension 01/06/2010   Last Assessment & Plan:  Well controlled Continue med management   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  Steroid usage was discontinued several months ago.  Follow up with Dermatology as planned.   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  Drug  HX Current Rx Pre-ABL inefficacy Pre-ABL intolerant Post-ABL inefficacy Post-ABL intolerant max dose/24h M/Y end comments  sotalol                  dofetilide                  flecainide  propafenone                  am   S/P TKR (total knee replacement), bilateral    Tinea cruris 04/16/2013   Type 2 diabetes mellitus with diabetic polyneuropathy, without long-term current use of insulin ([place]) 04/16/2013   Last Assessment & Plan:  Labs today Pt reports well controlled on ambulatory monitoring   Past Surgical History:  Procedure Laterality Date   CARDIAC ELECTROPHYSIOLOGY STUDY AND ABLATION     CARDIOVERSION     CARDIOVERSION N/A 10/10/2018    Procedure: CARDIOVERSION;  Surgeon: [name], MD;  Location: [facility];  Service: Cardiovascular;  Laterality: N/A;   CHOLECYSTECTOMY     PROSTATECTOMY     REPLACEMENT TOTAL KNEE BILATERAL       Current Outpatient Medications  Medication Sig Dispense Refill   atorvastatin (LIPITOR) 20 MG tablet TAKE 1 TABLET DAILY AT 6 P.M. 90 tablet 1   diltiazem (CARDIZEM CD) 360 MG 24 hr capsule TAKE 1 CAPSULE DAILY 90 capsule 1   ELIQUIS 5 MG TABS tablet TAKE 1 TABLET TWICE A DAY 180 tablet 1   furosemide (LASIX) 40 MG tablet Take 1 tablet (40 mg total) by mouth 2 (two) times daily. 60 tablet 3   gabapentin (NEURONTIN) 300 MG capsule Take 2 capsules (600 mg total) by mouth at bedtime. 180 capsule 1   glucose blood ([employer] TEST STRIPS) test strip Test 2x dailyDx: type 2 diabetes, uncontrolledfluctuating blood sugars 100 each 0   glucose blood test strip Use to test blood sugars once daily as directed Dx:E11.65     lisinopril (PRINIVIL,ZESTRIL) 10 MG tablet Take 10 mg by mouth every evening.      metFORMIN (GLUCOPHAGE) 500 MG tablet Take 1 tablet (500 mg total) by mouth 2 (two) times daily with a meal. 180 tablet 1   metoprolol tartrate (LOPRESSOR) 50 MG tablet Take 100 mg by mouth 2 (two) times daily.      mometasone (NASONEX) 50 MCG/ACT nasal spray Place 2 sprays into the nose 2 (two) times daily.      montelukast (SINGULAIR) 10 MG tablet Take 10 mg by mouth at bedtime.      potassium chloride SA (K-DUR) 20 MEQ tablet Take 1 tablet (20 mEq total) by mouth daily. 30 tablet 3   No current facility-administered medications for this visit.     Allergies:   Patient has no known allergies.   Social History:  The patient  reports that he has never smoked. He has never used smokeless tobacco. He reports current alcohol use of about 2.0 standard drinks of alcohol per week. He reports that he does not use drugs.   Family History:  The patient's family history includes Cancer in his  father and mother; Depression in his father and mother; Early death in his father and mother.    ROS:  Please see the history of present illness.   Otherwise, review of systems is positive for none.   All other systems are reviewed and negative.    PHYSICAL EXAM: VS:  BP 122/70    Pulse 85    Ht 5\' 10"  (1.778 m)    Wt 185 lb (83.9 kg)    SpO2 95%    BMI 26.54 kg/m  , BMI Body mass index is 26.54 kg/m. GEN: Well nourished, well developed, in no acute distress  HEENT: normal  Neck: no JVD, carotid bruits, or masses Cardiac: iRRR; no murmurs, rubs, or gallops,no edema  Respiratory:  clear to auscultation bilaterally, normal work of breathing GI: soft, nontender, nondistended, + BS MS: no deformity or atrophy  Skin: warm and dry Neuro:  Strength and sensation are intact Psych: euthymic mood, full affect  EKG:  EKG is ordered today. Personal review of the ekg ordered shows atrial fibrillation, PVC, rate 85  Recent Labs: 07/24/2018: ALT 16; TSH 0.87 10/06/2018: BUN 12; Creatinine, Ser 1.03; Hemoglobin 12.3; NT-Pro BNP 957; Platelets 177; Potassium 3.7; Sodium 137    Lipid Panel     Component Value Date/Time   CHOL 141 07/24/2018 1058   TRIG 125.0 07/24/2018 1058   HDL 44.00 07/24/2018 1058   CHOLHDL 3 07/24/2018 1058   VLDL 25.0 07/24/2018 1058   LDLCALC 72 07/24/2018 1058     Wt Readings from Last 3 Encounters:  11/05/18 185 lb (83.9 kg)  10/06/18 187 lb (84.8 kg)  07/24/18 184 lb 4.8 oz (83.6 kg)      Other studies Reviewed: Additional studies/ records that were reviewed today include: TTE 10/07/18  Review of the above records today demonstrates:   1. The left ventricle has low normal systolic function, with an ejection fraction of 50-55%. The cavity size was normal. Left ventricular diastolic function could not be evaluated secondary to atrial fibrillation. No evidence of left ventricular  regional wall motion abnormalities.  2. The right ventricle has normal systolic  function. The cavity was normal. There is no increase in right ventricular wall thickness. Right ventricular systolic pressure is normal.  3. There is mild mitral annular calcification present.  4. The aortic valve is tricuspid. Moderate sclerosis of the aortic valve. Aortic valve regurgitation was not assessed by color flow Doppler.  5. The aorta is normal in size and structure.  6. There is right bowing of the interatrial septum, suggestive of elevated left atrial pressure.   ASSESSMENT AND PLAN:  1.  Persistent atrial fibrillation: Currently on Eliquis.  Recent cardioversion 10/10/2018 2 unfortunately failed to convert him.  He is somewhat symptomatic in atrial fibrillation with some weakness and shortness of breath.  I do think he would benefit from returning to sinus rhythm.  We discussed options including ablation versus dofetilide loading.  He would like to think about this with his family. Charles Lloyd get old ablation records from [facility] as well.  2.  Chronic systolic heart failure: Has recently been decompensated, but has been on higher doses of Lasix.  No signs of volume overload  3.  Hypertension: Currently well controlled  4.  Hyperlipidemia: Continue statin per primary cardiology.    Current medicines are reviewed at length with the patient today.   The patient does not have concerns regarding his medicines.  The following changes were made today:  none  Labs/ tests ordered today include:  Orders Placed This Encounter  Procedures   EKG 12-Lead   Case discussed with referring cardiologist  Disposition:   FU with Charles Lloyd 3 months  Signed, Kelilah Hebard Meredith Leeds, MD  11/05/2018 10:25 AM     [address] [place] [id number] [phone] (office) [phone] (fax)

## 2018-11-05 NOTE — Progress Notes (Signed)
Cardiology Office Note:    Date:  11/06/2018   ID:  Charles Gum., DOB 06-21-1940, MRN [id number]  PCP:  Isaac Bliss, [name], MD  Cardiologist:  [name], MD    Referring MD: Isaac Bliss, [place]*    ASSESSMENT:    1. Persistent atrial fibrillation   2. Chronic anticoagulation   3. Hypertensive heart disease with heart failure ([place])    PLAN:    In order of problems listed above:  1. I strongly advised him to proceed with EP ablation with his atrial fibrillation and heart failure he agrees he is seen EP and I will plan to see him back in the office afterwards as needed.  We discussed optimizing heart failure with transition from ACE inhibitor to [facility] and he says he prefers to wait until after the procedure and also think he be a candidate for SGLT2 inhibitor with his diabetes.  He will remain anticoagulated continue his current diuretic with compensated heart failure no volume overload recheck his renal function potassium proBNP level and blood pressure is at target on guideline directed therapy including ACE inhibitor and loop diuretic.   Next appointment: As needed after EP care  He has markedly improved weight down 8 to 9 pounds and all signs and symptoms of heart failure resolved no edema orthopnea chest pain palpitations syncope compliant with his anticoagulant and no bleeding complication   Medication Adjustments/Labs and Tests Ordered: Current medicines are reviewed at length with the patient today.  Concerns regarding medicines are outlined above.  No orders of the defined types were placed in this encounter.  No orders of the defined types were placed in this encounter.   Chief Complaint  Patient presents with  . Atrial Fibrillation  . Congestive Heart Failure    History of Present Illness:    Charles Coppess. is a 78 y.o. male with a hx of  persistent  atrial fibrillation with initial ablation in 2015 at [facility], subsequently had  cardioversion in 2017 and again repeat electrical cardioversion in September 2019, heart failure EF 40%, hypertension and hyperlipidemia. He is maintained on chronic anticoagulation with Eliquis, he is rate controlled on Cardizem and metoprolol.    He was last seen 10/06/2018 with decompensated heart failure.he failed cardioversion 10/10/2018 and referred to EP.  He was given the option of EP ablation or antiarrhythmic drug therapy. Compliance with diet, lifestyle and medications: Yes   TTE 10/07/18  Review of the above records today demonstrates:  1. The left ventricle has low normal systolic function, with an ejection fraction of 50-55%. The cavity size was normal. Left ventricular diastolic function could not be evaluated secondary to atrial fibrillation. No evidence of left ventricular  regional wall motion abnormalities. 2. The right ventricle has normal systolic function. The cavity was normal. There is no increase in right ventricular wall thickness. Right ventricular systolic pressure is normal. 3. There is mild mitral annular calcification present. 4. The aortic valve is tricuspid. Moderate sclerosis of the aortic valve. Aortic valve regurgitation was not assessed by color flow Doppler. 5. The aorta is normal in size and structure.  Past Medical History:  Diagnosis Date  . Atrial fibrillation ([place])   . B12 deficiency   . 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 Cancer ([place])   . CHF (congestive heart failure) ([place]) 04/16/2013   Functional class II, ejection fraction 35-40%  . Chronic anticoagulation   . Chronic diastolic (congestive) heart failure (  [place]) 04/16/2013   Functional class II, ejection fraction 35-40%  Last Assessment & Plan:  Clinically stable Volume well controlled meds reviewed  . Chronic diastolic CHF (congestive heart failure) ([place])   . Colon polyps 04/16/2013  . Diabetes mellitus without complication ([place])   . Diabetic neuropathy ([place])   .  Essential hypertension 01/06/2010   Last Assessment & Plan:  Well controlled Continue med management  . 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  Steroid usage was discontinued several months ago.  Follow up with Dermatology as planned.  Marland Kitchen Heart murmur   . Hypercholesterolemia 01/06/2010   Last Assessment & Plan:  Repeat labs recommended  . Hyperlipidemia   . Hyperlipidemia associated with type 2 diabetes mellitus ([place]) 01/28/2018  . Hypertension   . Infection of prosthetic right knee joint ([place])   . 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 On amiodarone therapy 09/07/2013  . On continuous oral anticoagulation 01/28/2018  . Overweight (BMI 25.0-29.9) 10/22/2016  . Persistent atrial fibrillation 04/16/2013   Last Assessment & Plan:  Rate controlled Continue med management eliquis for stroke preventino  Formatting of this note might be different from the original.  Drug  HX Current Rx Pre-ABL inefficacy Pre-ABL intolerant Post-ABL inefficacy Post-ABL intolerant max dose/24h M/Y end comments  sotalol                  dofetilide                  flecainide                  propafenone                  am  . S/P TKR (total knee replacement), bilateral   . Tinea cruris 04/16/2013  . Type 2 diabetes mellitus with diabetic polyneuropathy, without long-term current use of insulin ([place]) 04/16/2013   Last Assessment & Plan:  Labs today Pt reports well controlled on ambulatory monitoring    Past Surgical History:  Procedure Laterality Date  . CARDIAC ELECTROPHYSIOLOGY STUDY AND ABLATION    . CARDIOVERSION    . CARDIOVERSION N/A 10/10/2018   Procedure: CARDIOVERSION;  Surgeon: [name], MD;  Location: [facility] ENDOSCOPY;  Service: Cardiovascular;  Laterality: N/A;  . CHOLECYSTECTOMY    . PROSTATECTOMY    . REPLACEMENT TOTAL KNEE  BILATERAL      Current Medications: Current Meds  Medication Sig  . atorvastatin (LIPITOR) 20 MG tablet TAKE 1 TABLET DAILY AT 6 P.M.  . diltiazem (CARDIZEM CD) 360 MG 24 hr capsule TAKE 1 CAPSULE DAILY  . ELIQUIS 5 MG TABS tablet TAKE 1 TABLET TWICE A DAY  . furosemide (LASIX) 40 MG tablet Take 1 tablet (40 mg total) by mouth 2 (two) times daily.  Marland Kitchen gabapentin (NEURONTIN) 300 MG capsule Take 2 capsules (600 mg total) by mouth at bedtime.  Marland Kitchen glucose blood ([employer] TEST STRIPS) test strip Test 2x dailyDx: type 2 diabetes, uncontrolledfluctuating blood sugars  . glucose blood test strip Use to test blood sugars once daily as directed Dx:E11.65  . lisinopril (PRINIVIL,ZESTRIL) 10 MG tablet Take 10 mg by mouth every evening.   . metFORMIN (GLUCOPHAGE) 500 MG tablet Take 1 tablet (500 mg total) by mouth 2 (two) times daily with a  meal.  . metoprolol tartrate (LOPRESSOR) 50 MG tablet Take 100 mg by mouth 2 (two) times daily.   . mometasone (NASONEX) 50 MCG/ACT nasal spray Place 2 sprays into the nose 2 (two) times daily.   . montelukast (SINGULAIR) 10 MG tablet Take 10 mg by mouth at bedtime.   . potassium chloride SA (K-DUR) 20 MEQ tablet Take 1 tablet (20 mEq total) by mouth daily.     Allergies:   Patient has no known allergies.   Social History   Socioeconomic History  . Marital status: Widowed    Spouse name: Not on file  . Number of children: Not on file  . Years of education: Not on file  . Highest education level: Not on file  Occupational History  . Not on file  Social Needs  . Financial resource strain: Not on file  . Food insecurity    Worry: Not on file    Inability: Not on file  . Transportation needs    Medical: Not on file    Non-medical: Not on file  Tobacco Use  . Smoking status: Never Smoker  . Smokeless tobacco: Never Used  Substance and Sexual Activity  . Alcohol use: Yes    Alcohol/week: 2.0 standard drinks    Types: 2 Glasses of wine per week    Comment:  daily  . Drug use: Never  . Sexual activity: Not Currently  Lifestyle  . Physical activity    Days per week: Not on file    Minutes per session: Not on file  . Stress: Not on file  Relationships  . Social Herbalist on phone: Not on file    Gets together: Not on file    Attends religious service: Not on file    Active member of club or organization: Not on file    Attends meetings of clubs or organizations: Not on file    Relationship status: Not on file  Other Topics Concern  . Not on file  Social History Narrative  . Not on file     Family History: The patient's family history includes Cancer in his father and mother; Depression in his father and mother; Early death in his father and mother. ROS:   Please see the history of present illness.    All other systems reviewed and are negative.  EKGs/Labs/Other Studies Reviewed:    The following studies were reviewed today:    Recent Labs: 07/24/2018: ALT 16; TSH 0.87 10/06/2018: BUN 12; Creatinine, Ser 1.03; Hemoglobin 12.3; NT-Pro BNP 957; Platelets 177; Potassium 3.7; Sodium 137  Recent Lipid Panel    Component Value Date/Time   CHOL 141 07/24/2018 1058   TRIG 125.0 07/24/2018 1058   HDL 44.00 07/24/2018 1058   CHOLHDL 3 07/24/2018 1058   VLDL 25.0 07/24/2018 1058   LDLCALC 72 07/24/2018 1058    Physical Exam:    VS:  BP 122/76 (BP Location: Right Arm, Patient Position: Sitting, Cuff Size: Normal)   Pulse 81   Temp 98.1 F (36.7 C)   Ht 5\' 10"  (1.778 m)   Wt 185 lb 12.8 oz (84.3 kg)   SpO2 97%   BMI 26.66 kg/m     Wt Readings from Last 3 Encounters:  11/06/18 185 lb 12.8 oz (84.3 kg)  11/05/18 185 lb (83.9 kg)  10/06/18 187 lb (84.8 kg)     GEN:  Well nourished, well developed in no acute distress HEENT: Normal NECK: No JVD; No carotid bruits  LYMPHATICS: No lymphadenopathy CARDIAC: Regular rhythm variable S1 no murmurs, rubs, gallops RESPIRATORY:  Clear to auscultation without rales,  wheezing or rhonchi  ABDOMEN: Soft, non-tender, non-distended MUSCULOSKELETAL:  No edema; No deformity  SKIN: Warm and dry NEUROLOGIC:  Alert and oriented x 3 PSYCHIATRIC:  Normal affect    Signed, [name], MD  11/06/2018 9:56 AM    [place]

## 2018-11-05 NOTE — Patient Instructions (Signed)
Medication Instructions:  Your physician recommends that you continue on your current medications as directed. Please refer to the Current Medication list given to you today.  * If you need a refill on your cardiac medications before your next appointment, please call your pharmacy.   Labwork: None ordered  Testing/Procedures: None ordered  Follow-Up: To be determined.  Please call the office and speak to nurse Colonel Krauser when you have made a decision  Thank you for choosing [facility]!!   Trinidad Curet, RN [phone]  Any Other Special Instructions Will Be Listed Below (If Applicable).   Cardiac Ablation Cardiac ablation is a procedure to disable (ablate) a small amount of heart tissue in very specific places. The heart has many electrical connections. Sometimes these connections are abnormal and can cause the heart to beat very fast or irregularly. Ablating some of the problem areas can improve the heart rhythm or return it to normal. Ablation may be done for people who:  Have Wolff-Parkinson-White syndrome.  Have fast heart rhythms (tachycardia).  Have taken medicines for an abnormal heart rhythm (arrhythmia) that were not effective or caused side effects.  Have a high-risk heartbeat that may be life-threatening. During the procedure, a small incision is made in the neck or the groin, and a long, thin, flexible tube (catheter) is inserted into the incision and moved to the heart. Small devices (electrodes) on the tip of the catheter will send out electrical currents. A type of X-ray (fluoroscopy) will be used to help guide the catheter and to provide images of the heart. Tell a health care provider about:  Any allergies you have.  All medicines you are taking, including vitamins, herbs, eye drops, creams, and over-the-counter medicines.  Any problems you or family members have had with anesthetic medicines.  Any blood disorders you have.  Any surgeries you have had.   Any medical conditions you have, such as kidney failure.  Whether you are pregnant or may be pregnant. What are the risks? Generally, this is a safe procedure. However, problems may occur, including:  Infection.  Bruising and bleeding at the catheter insertion site.  Bleeding into the chest, especially into the sac that surrounds the heart. This is a serious complication.  Stroke or blood clots.  Damage to other structures or organs.  Allergic reaction to medicines or dyes.  Need for a permanent pacemaker if the normal electrical system is damaged. A pacemaker is a small computer that sends electrical signals to the heart and helps your heart beat normally.  The procedure not being fully effective. This may not be recognized until months later. Repeat ablation procedures are sometimes required. What happens before the procedure?  Follow instructions from your health care provider about eating or drinking restrictions.  Ask your health care provider about: ? Changing or stopping your regular medicines. This is especially important if you are taking diabetes medicines or blood thinners. ? Taking medicines such as aspirin and ibuprofen. These medicines can thin your blood. Do not take these medicines before your procedure if your health care provider instructs you not to.  Plan to have someone take you home from the hospital or clinic.  If you will be going home right after the procedure, plan to have someone with you for 24 hours. What happens during the procedure?  To lower your risk of infection: ? Your health care team will wash or sanitize their hands. ? Your skin will be washed with soap. ? Hair may be removed  from the incision area.  An IV tube will be inserted into one of your veins.  You will be given a medicine to help you relax (sedative).  The skin on your neck or groin will be numbed.  An incision will be made in your neck or your groin.  A needle will be  inserted through the incision and into a large vein in your neck or groin.  A catheter will be inserted into the needle and moved to your heart.  Dye may be injected through the catheter to help your surgeon see the area of the heart that needs treatment.  Electrical currents will be sent from the catheter to ablate heart tissue in desired areas. There are three types of energy that may be used to ablate heart tissue: ? Heat (radiofrequency energy). ? Laser energy. ? Extreme cold (cryoablation).  When the necessary tissue has been ablated, the catheter will be removed.  Pressure will be held on the catheter insertion area to prevent excessive bleeding.  A bandage (dressing) will be placed over the catheter insertion area. The procedure may vary among health care providers and hospitals. What happens after the procedure?  Your blood pressure, heart rate, breathing rate, and blood oxygen level will be monitored until the medicines you were given have worn off.  Your catheter insertion area will be monitored for bleeding. You will need to lie still for a few hours to ensure that you do not bleed from the catheter insertion area.  Do not drive for 24 hours or as long as directed by your health care provider. Summary  Cardiac ablation is a procedure to disable (ablate) a small amount of heart tissue in very specific places. Ablating some of the problem areas can improve the heart rhythm or return it to normal.  During the procedure, electrical currents will be sent from the catheter to ablate heart tissue in desired areas. This information is not intended to replace advice given to you by your health care provider. Make sure you discuss any questions you have with your health care provider. Document Released: 07/01/2008 Document Revised: 08/05/2017 Document Reviewed: 01/02/2016 Elsevier Patient Education  [name].    Dofetilide capsules What is this medicine? DOFETILIDE  (doe FET il ide) is an antiarrhythmic drug. It helps make your heart beat regularly. This medicine also helps to slow rapid heartbeats. This medicine may be used for other purposes; ask your health care provider or pharmacist if you have questions. COMMON BRAND NAME(S): Tikosyn What should I tell my health care provider before I take this medicine? They need to know if you have any of these conditions:  heart disease  history of irregular heartbeat  history of low levels of potassium or magnesium in the blood  kidney disease  liver disease  an unusual or allergic reaction to dofetilide, other medicines, foods, dyes, or preservatives  pregnant or trying to get pregnant  breast-feeding How should I use this medicine? Take this medicine by mouth with a glass of water. Follow the directions on the prescription label. Do not take with grapefruit juice. You can take it with or without food. If it upsets your stomach, take it with food. Take your medicine at regular intervals. Do not take it more often than directed. Do not stop taking except on your doctor's advice. A special MedGuide will be given to you by the pharmacist with each prescription and refill. Be sure to read this information carefully each time. Talk to  your pediatrician regarding the use of this medicine in children. Special care may be needed. Overdosage: If you think you have taken too much of this medicine contact a poison control center or emergency room at once. NOTE: This medicine is only for you. Do not share this medicine with others. What if I miss a dose? If you miss a dose, skip it. Take your next dose at the normal time. Do not take extra or 2 doses at the same time to make up for the missed dose. What may interact with this medicine? Do not take this medicine with any of the following medications:  cimetidine  cisapride  dolutegravir  dronedarone  hydrochlorothiazide  ketoconazole  megestrol   pimozide  prochlorperazine  thioridazine  trimethoprim  verapamil This medicine may also interact with the following medications:  amiloride  cannabinoids  certain antibiotics like erythromycin or clarithromycin  certain antiviral medicines for HIV or hepatitis  certain medicines for depression, anxiety, or psychotic disorders  digoxin  diltiazem  grapefruit juice  metformin  nefazodone  other medicines that prolong the QT interval (an abnormal heart rhythm)  quinine  triamterene  zafirlukast  ziprasidone This list may not describe all possible interactions. Give your health care provider a list of all the medicines, herbs, non-prescription drugs, or dietary supplements you use. Also tell them if you smoke, drink alcohol, or use illegal drugs. Some items may interact with your medicine. What should I watch for while using this medicine? Your condition will be monitored carefully while you are receiving this medicine. What side effects may I notice from receiving this medicine? Side effects that you should report to your doctor or health care professional as soon as possible:  allergic reactions like skin rash, itching or hives, swelling of the face, lips, or tongue  breathing problems  chest pain or chest tightness  dizziness  signs and symptoms of a dangerous change in heartbeat or heart rhythm like chest pain; dizziness; fast or irregular heartbeat; palpitations; feeling faint or lightheaded, falls; breathing problems  signs and symptoms of electrolyte imbalance like severe diarrhea, unusual sweating, vomiting, loss of appetite, increased thirst  swelling of the ankles, legs, or feet  tingling, numbness in the hands or feet Side effects that usually do not require medical attention (report to your doctor or health care professional if they continue or are bothersome):  diarrhea  general ill feeling or flu-like symptoms  headache  nausea   trouble sleeping  stomach pain This list may not describe all possible side effects. Call your doctor for medical advice about side effects. You may report side effects to FDA at 1-800-FDA-1088. Where should I keep my medicine? Keep out of the reach of children. Store at room temperature between 15 and 30 degrees C (59 and 86 degrees F). Throw away any unused medicine after the expiration date. NOTE: This sheet is a summary. It may not cover all possible information. If you have questions about this medicine, talk to your doctor, pharmacist, or health care provider.  2020 Elsevier/Gold Standard (2018-02-03 10:18:48)

## 2018-11-06 ENCOUNTER — Ambulatory Visit (INDEPENDENT_AMBULATORY_CARE_PROVIDER_SITE_OTHER): Payer: Medicare Other | Admitting: Cardiology

## 2018-11-06 ENCOUNTER — Encounter: Payer: Self-pay | Admitting: Cardiology

## 2018-11-06 VITALS — BP 122/76 | HR 81 | Temp 98.1°F | Ht 70.0 in | Wt 185.8 lb

## 2018-11-06 DIAGNOSIS — Z7901 Long term (current) use of anticoagulants: Secondary | ICD-10-CM

## 2018-11-06 DIAGNOSIS — I110 Hypertensive heart disease with heart failure: Secondary | ICD-10-CM

## 2018-11-06 DIAGNOSIS — E7800 Pure hypercholesterolemia, unspecified: Secondary | ICD-10-CM

## 2018-11-06 DIAGNOSIS — I4819 Other persistent atrial fibrillation: Secondary | ICD-10-CM | POA: Diagnosis not present

## 2018-11-06 NOTE — Telephone Encounter (Signed)
[place] faxed request for medical records from [facility] 11/05/18  KLM

## 2018-11-06 NOTE — Patient Instructions (Signed)
Medication Instructions:  Your physician recommends that you continue on your current medications as directed. Please refer to the Current Medication list given to you today.  If you need a refill on your cardiac medications before your next appointment, please call your pharmacy.   Lab work: Your physician recommends that you return for lab work today: BMP, lipid panel, ProBNP.   If you have labs (blood work) drawn today and your tests are completely normal, you will receive your results only by: Marland Kitchen MyChart Message (if you have MyChart) OR . A paper copy in the mail If you have any lab test that is abnormal or we need to change your treatment, we will call you to review the results.  Testing/Procedures: None  Follow-Up: At [facility], you and your health needs are our priority.  As part of our continuing mission to provide you with exceptional heart care, we have created designated Provider Care Teams.  These Care Teams include your primary Cardiologist (physician) and Advanced Practice Providers (APPs -  Physician Assistants and Nurse Practitioners) who all work together to provide you with the care you need, when you need it. You will need a follow up appointment as needed if symptoms worsen or fail to improve.

## 2018-11-07 LAB — LIPID PANEL
Chol/HDL Ratio: 3.1 ratio (ref 0.0–5.0)
Cholesterol, Total: 141 mg/dL (ref 100–199)
HDL: 45 mg/dL (ref >39)
LDL Chol Calc (NIH): 82 mg/dL (ref 0–99)
Triglycerides: 68 mg/dL (ref 0–149)
VLDL Cholesterol Cal: 14 mg/dL (ref 5–40)

## 2018-11-07 LAB — BASIC METABOLIC PANEL
BUN/Creatinine Ratio: 19 (ref 10–24)
BUN: 25 mg/dL (ref 8–27)
CO2: 27 mmol/L (ref 20–29)
Calcium: 9.5 mg/dL (ref 8.6–10.2)
Chloride: 104 mmol/L (ref 96–106)
Creatinine, Ser: 1.3 mg/dL — ABNORMAL HIGH (ref 0.76–1.27)
GFR calc Af Amer: 62 mL/min/{1.73_m2} (ref >59)
GFR calc non Af Amer: 53 mL/min/{1.73_m2} — ABNORMAL LOW (ref >59)
Glucose: 109 mg/dL — ABNORMAL HIGH (ref 65–99)
Potassium: 4.2 mmol/L (ref 3.5–5.2)
Sodium: 142 mmol/L (ref 134–144)

## 2018-11-07 LAB — PRO B NATRIURETIC PEPTIDE: NT-Pro BNP: 1062 pg/mL — ABNORMAL HIGH (ref 0–486)

## 2018-11-07 NOTE — Telephone Encounter (Signed)
Rec'd 80 pages of medical records from [facility].  Records were prepped & placed in Dr. Macky Lower box. 11/07/18  KLM

## 2018-11-24 ENCOUNTER — Telehealth: Payer: Self-pay | Admitting: Cardiology

## 2018-11-24 NOTE — Telephone Encounter (Signed)
New Message  Patient is calling in to discuss ablation and to have it scheduled. Please give patient a call back about scheduling ablation.

## 2018-11-24 NOTE — Telephone Encounter (Signed)
Informed that we received records from [facility] and in Dr. Curt Bears to review folder.  Aware that we are at a satellite location today and he will review records tomorrow. Aware I will follow up once records reviewed. Patient verbalized understanding and agreeable to plan.

## 2018-11-25 ENCOUNTER — Encounter: Payer: Self-pay | Admitting: Internal Medicine

## 2018-11-25 ENCOUNTER — Ambulatory Visit: Payer: Medicare Other | Admitting: Internal Medicine

## 2018-11-25 ENCOUNTER — Other Ambulatory Visit: Payer: Self-pay

## 2018-11-25 VITALS — BP 118/80 | HR 77 | Temp 97.8°F | Wt 184.3 lb

## 2018-11-25 DIAGNOSIS — E1142 Type 2 diabetes mellitus with diabetic polyneuropathy: Secondary | ICD-10-CM

## 2018-11-25 DIAGNOSIS — I5042 Chronic combined systolic (congestive) and diastolic (congestive) heart failure: Secondary | ICD-10-CM | POA: Diagnosis not present

## 2018-11-25 DIAGNOSIS — E785 Hyperlipidemia, unspecified: Secondary | ICD-10-CM

## 2018-11-25 DIAGNOSIS — I110 Hypertensive heart disease with heart failure: Secondary | ICD-10-CM

## 2018-11-25 DIAGNOSIS — Z23 Encounter for immunization: Secondary | ICD-10-CM | POA: Diagnosis not present

## 2018-11-25 DIAGNOSIS — E1169 Type 2 diabetes mellitus with other specified complication: Secondary | ICD-10-CM

## 2018-11-25 DIAGNOSIS — I4819 Other persistent atrial fibrillation: Secondary | ICD-10-CM | POA: Diagnosis not present

## 2018-11-25 LAB — POCT GLYCOSYLATED HEMOGLOBIN (HGB A1C): Hemoglobin A1C: 6.3 % — AB (ref 4.0–5.6)

## 2018-11-25 MED ORDER — LISINOPRIL 10 MG PO TABS: 10.0000 mg | ORAL_TABLET | Freq: Every evening | ORAL | 1 refills | Status: DC

## 2018-11-25 NOTE — Progress Notes (Signed)
Established Patient Office Visit     CC/Reason for Visit: 38-month follow-up of chronic conditions  HPI: Cordney Hanes. is a 78 y.o. male who is coming in today for the above mentioned reasons. Past Medical History is significant [id number] fibrillation, chronic diastolic heart failure who is stable and has been seen by cardiology locally.  Also has a history of well-controlled type 2 diabetes on metformin, hyperlipidemia on Lipitor, prior history of prostate cancer status post TURP, prior history of B12 deficiency on monthly supplementation.  Since I last saw him with decompensated heart failure, he has seen his cardiologist who referred him to EP for attempted cardioversion x4 that was unsuccessful.  He is currently being reviewed for repeat ablation.  He is otherwise doing well and has had no new issues.   Past Medical/Surgical History: Past Medical History:  Diagnosis Date  . Atrial fibrillation ([place])   . B12 deficiency   . 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 Cancer ([place])   . CHF (congestive heart failure) ([place]) 04/16/2013   Functional class II, ejection fraction 35-40%  . Chronic anticoagulation   . Chronic diastolic (congestive) heart failure (HCC) 04/16/2013   Functional class II, ejection fraction 35-40%  Last Assessment & Plan:  Clinically stable Volume well controlled meds reviewed  . Chronic diastolic CHF (congestive heart failure) ([place])   . Colon polyps 04/16/2013  . Diabetes mellitus without complication ([place])   . Diabetic neuropathy ([place])   . Essential hypertension 01/06/2010   Last Assessment & Plan:  Well controlled Continue med management  . 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  Steroid usage was discontinued several months ago.  Follow up with Dermatology as planned.  Marland Kitchen Heart murmur   . Hypercholesterolemia 01/06/2010   Last Assessment & Plan:  Repeat labs recommended  .  Hyperlipidemia   . Hyperlipidemia associated with type 2 diabetes mellitus ([place]) 01/28/2018  . Hypertension   . Infection of prosthetic right knee joint ([place])   . 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 On amiodarone therapy 09/07/2013  . On continuous oral anticoagulation 01/28/2018  . Overweight (BMI 25.0-29.9) 10/22/2016  . Persistent atrial fibrillation 04/16/2013   Last Assessment & Plan:  Rate controlled Continue med management eliquis for stroke preventino  Formatting of this note might be different from the original.  Drug  HX Current Rx Pre-ABL inefficacy Pre-ABL intolerant Post-ABL inefficacy Post-ABL intolerant max dose/24h M/Y end comments  sotalol                  dofetilide                  flecainide                  propafenone                  am  . S/P TKR (total knee replacement), bilateral   . Tinea cruris 04/16/2013  . Type 2 diabetes mellitus with diabetic polyneuropathy, without long-term current use of insulin ([place]) 04/16/2013   Last Assessment & Plan:  Labs today Pt reports well controlled on ambulatory monitoring    Past Surgical History:  Procedure Laterality Date  . CARDIAC ELECTROPHYSIOLOGY STUDY AND ABLATION    .  CARDIOVERSION    . CARDIOVERSION N/A 10/10/2018   Procedure: CARDIOVERSION;  Surgeon: [name], MD;  Location: [facility] ENDOSCOPY;  Service: Cardiovascular;  Laterality: N/A;  . CHOLECYSTECTOMY    . PROSTATECTOMY    . REPLACEMENT TOTAL KNEE BILATERAL      Social History:  reports that he has never smoked. He has never used smokeless tobacco. He reports current alcohol use of about 2.0 standard drinks of alcohol per week. He reports that he does not use drugs.  Allergies: No Known Allergies  Family History:  Family History  Problem Relation Age of Onset  . Cancer Mother   . Depression Mother   . Early death Mother   . Cancer  Father   . Depression Father   . Early death Father      Current Outpatient Medications:  .  atorvastatin (LIPITOR) 20 MG tablet, TAKE 1 TABLET DAILY AT 6 P.M., Disp: 90 tablet, Rfl: 1 .  diltiazem (CARDIZEM CD) 360 MG 24 hr capsule, TAKE 1 CAPSULE DAILY, Disp: 90 capsule, Rfl: 1 .  ELIQUIS 5 MG TABS tablet, TAKE 1 TABLET TWICE A DAY, Disp: 180 tablet, Rfl: 1 .  furosemide (LASIX) 40 MG tablet, Take 1 tablet (40 mg total) by mouth 2 (two) times daily., Disp: 60 tablet, Rfl: 3 .  gabapentin (NEURONTIN) 300 MG capsule, Take 2 capsules (600 mg total) by mouth at bedtime., Disp: 180 capsule, Rfl: 1 .  glucose blood ([employer] TEST STRIPS) test strip, Test 2x dailyDx: type 2 diabetes, uncontrolledfluctuating blood sugars, Disp: 100 each, Rfl: 0 .  glucose blood test strip, Use to test blood sugars once daily as directed [id number]:E11.65, Disp: , Rfl:  .  lisinopril (ZESTRIL) 10 MG tablet, Take 1 tablet (10 mg total) by mouth every evening., Disp: 90 tablet, Rfl: 1 .  metFORMIN (GLUCOPHAGE) 500 MG tablet, Take 1 tablet (500 mg total) by mouth 2 (two) times daily with a meal., Disp: 180 tablet, Rfl: 1 .  metoprolol tartrate (LOPRESSOR) 50 MG tablet, Take 100 mg by mouth 2 (two) times daily. , Disp: , Rfl:  .  mometasone (NASONEX) 50 MCG/ACT nasal spray, Place 2 sprays into the nose 2 (two) times daily. , Disp: , Rfl:  .  montelukast (SINGULAIR) 10 MG tablet, Take 10 mg by mouth at bedtime. , Disp: , Rfl:  .  potassium chloride SA (K-DUR) 20 MEQ tablet, Take 1 tablet (20 mEq total) by mouth daily., Disp: 30 tablet, Rfl: 3  Review of Systems:  Constitutional: Denies fever, chills, diaphoresis, appetite change and fatigue.  HEENT: Denies photophobia, eye pain, redness, hearing loss, ear pain, congestion, sore throat, rhinorrhea, sneezing, mouth sores, trouble swallowing, neck pain, neck stiffness and tinnitus.   Respiratory: Denies SOB, DOE, cough, chest tightness,  and wheezing.   Cardiovascular: Denies chest  pain, palpitations and leg swelling.  Gastrointestinal: Denies nausea, vomiting, abdominal pain, diarrhea, constipation, blood in stool and abdominal distention.  Genitourinary: Denies dysuria, urgency, frequency, hematuria, flank pain and difficulty urinating.  Endocrine: Denies: hot or cold intolerance, sweats, changes in hair or nails, polyuria, polydipsia. Musculoskeletal: Denies myalgias, back pain, joint swelling, arthralgias and gait problem.  Skin: Denies pallor, rash and wound.  Neurological: Denies dizziness, seizures, syncope, weakness, light-headedness, numbness and headaches.  Hematological: Denies adenopathy. Easy bruising, personal or family bleeding history  Psychiatric/Behavioral: Denies suicidal ideation, mood changes, confusion, nervousness, sleep disturbance and agitation    Physical Exam: Vitals:   11/25/18 1109  BP: 118/80  Pulse: 77  Temp: 97.8 F (36.6 C)  TempSrc: Temporal  SpO2: 96%  Weight: 184 lb 4.8 oz (83.6 kg)    Body mass index is 26.44 kg/m.   Constitutional: NAD, calm, comfortable Eyes: PERRL, lids and conjunctivae normal ENMT: Mucous membranes are moist. Respiratory: clear to auscultation bilaterally, no wheezing, no crackles. Normal respiratory effort. No accessory muscle use.  Cardiovascular: Regular rate and rhythm, no murmurs / rubs / gallops. No extremity edema. 2+ pedal pulses. No carotid bruits.  Abdomen: no tenderness, no masses palpated. No hepatosplenomegaly. Bowel sounds positive.  Musculoskeletal: no clubbing / cyanosis. No joint deformity upper and lower extremities. Good ROM, no contractures. Normal muscle tone.  Skin: no rashes, lesions, ulcers. No induration Neurologic: Grossly intact and nonfocal Psychiatric: Normal judgment and insight. Alert and oriented x 3. Normal mood.    Impression and Plan:  Type 2 diabetes mellitus with diabetic polyneuropathy, without long-term current use of insulin ([place])  -Well-controlled with  an A1c today of 6.3.  Persistent atrial fibrillation -With recent failed cardioversion, he has been reviewed by Dr. Curt Bears for an ablation. -Anticoagulated on Eliquis  Hyperlipidemia associated with type 2 diabetes mellitus ([place]) -Well-controlled with a recent LDL of 72.  Chronic combined systolic and diastolic heart failure (HCC) -Compensated at present, his furosemide dose has been doubled to 20 mg twice daily, he is also on statin, ACE inhibitor, beta-blocker.  Hypertensive heart disease with heart failure (HCC) -Blood pressure is well controlled today.   Patient Instructions  -Nice seeing you today!!  -Flu vaccine today.  -Schedule 3 month follow up.     Lelon Frohlich, MD [facility]

## 2018-11-25 NOTE — Patient Instructions (Signed)
-  Nice seeing you today!!  -Flu vaccine today.  -Schedule 3 month follow up.

## 2018-12-04 ENCOUNTER — Telehealth: Payer: Self-pay | Admitting: Cardiology

## 2018-12-04 DIAGNOSIS — I4819 Other persistent atrial fibrillation: Secondary | ICD-10-CM

## 2018-12-04 NOTE — Telephone Encounter (Signed)
Pt aware I will follow up next week to arrange. Pt agreeable to plan.

## 2018-12-04 NOTE — Telephone Encounter (Signed)
Follow Up:    Pt said he was calling back to find out about scheduling his Ablation.

## 2018-12-10 NOTE — Telephone Encounter (Signed)
Pt made aware I would call next week to arrange.  Explained why Patient verbalized understanding and agreeable to plan.

## 2018-12-15 ENCOUNTER — Telehealth (INDEPENDENT_AMBULATORY_CARE_PROVIDER_SITE_OTHER): Payer: Medicare Other | Admitting: Family Medicine

## 2018-12-15 ENCOUNTER — Ambulatory Visit: Payer: Self-pay | Admitting: *Deleted

## 2018-12-15 ENCOUNTER — Other Ambulatory Visit: Payer: Self-pay

## 2018-12-15 DIAGNOSIS — J22 Unspecified acute lower respiratory infection: Secondary | ICD-10-CM

## 2018-12-15 DIAGNOSIS — I4819 Other persistent atrial fibrillation: Secondary | ICD-10-CM

## 2018-12-15 DIAGNOSIS — I5032 Chronic diastolic (congestive) heart failure: Secondary | ICD-10-CM | POA: Diagnosis not present

## 2018-12-15 MED ORDER — BENZONATATE 200 MG PO CAPS: 200.0000 mg | ORAL_CAPSULE | Freq: Two times a day (BID) | ORAL | 0 refills | Status: DC | PRN

## 2018-12-15 MED ORDER — AMOXICILLIN 875 MG PO TABS: 875.0000 mg | ORAL_TABLET | Freq: Two times a day (BID) | ORAL | 0 refills | Status: DC

## 2018-12-15 MED ORDER — BENZONATATE 100 MG PO CAPS: 200.0000 mg | ORAL_CAPSULE | Freq: Two times a day (BID) | ORAL | 0 refills | Status: DC | PRN

## 2018-12-15 MED ORDER — AMOXICILLIN-POT CLAVULANATE 875-125 MG PO TABS: 1.0000 | ORAL_TABLET | Freq: Two times a day (BID) | ORAL | 0 refills | Status: DC

## 2018-12-15 NOTE — Progress Notes (Signed)
Virtual Visit via Video Note  I connected with Charles Lloyd. on 12/15/18 at  9:00 AM EDT by a video enabled telemedicine application 2/2 [id number] pandemic and verified that I am speaking with the correct person using two identifiers.  Location patient: home Location provider:work or home office Persons participating in the virtual visit: patient, provider  I discussed the limitations of evaluation and management by telemedicine and the availability of in person appointments. The patient expressed understanding and agreed to proceed.   HPI: Pt is a 78 yo male with pmh sig for combined CHF, Afib, DM 2, HLD, HTN, h/o skin cancer, h/o prostate cancer followed by Dr. Jerilee Hoh.  Seen for acute concern.    Pt started coughing, wheezing, yellow green phelgm, Friday night.   Pt denies weight gain.  Currently 185 lbs.  Throat sore with increased coughing.  Pt denies rhinorrhrea, fever, HA, chills, ear pain or pressure, facial pain or pressure, diarrhea, n/v, irregular heart beat (h/o afib but states never notices when it is irregular), h/o tobacco use, SOB, or sick contacts.  Pt has not tried anything for his symptoms.  Pt thought about going to the ED, but is afraid 2/2 COVID 19 pandemic.     Had a Breo inhaler years ago, by his allergist. Taking singulair regularly.   ROS: See pertinent positives and negatives per HPI.  Past Medical History:  Diagnosis Date  . Atrial fibrillation ([place])   . B12 deficiency   . 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 Cancer ([place])   . CHF (congestive heart failure) ([place]) 04/16/2013   Functional class II, ejection fraction 35-40%  . Chronic anticoagulation   . Chronic diastolic (congestive) heart failure ([facility]) 04/16/2013   Functional class II, ejection fraction 35-40%  Last Assessment & Plan:  Clinically stable Volume well controlled meds reviewed  . Chronic diastolic CHF (congestive heart failure) ([place])   . Colon polyps 04/16/2013  .  Diabetes mellitus without complication ([place])   . Diabetic neuropathy ([place])   . Essential hypertension 01/06/2010   Last Assessment & Plan:  Well controlled Continue med management  . 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  Steroid usage was discontinued several months ago.  Follow up with Dermatology as planned.  Marland Kitchen Heart murmur   . Hypercholesterolemia 01/06/2010   Last Assessment & Plan:  Repeat labs recommended  . Hyperlipidemia   . Hyperlipidemia associated with type 2 diabetes mellitus ([place]) 01/28/2018  . Hypertension   . Infection of prosthetic right knee joint ([place])   . 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 On amiodarone therapy 09/07/2013  . On continuous oral anticoagulation 01/28/2018  . Overweight (BMI 25.0-29.9) 10/22/2016  . Persistent atrial fibrillation 04/16/2013   Last Assessment & Plan:  Rate controlled Continue med management eliquis for stroke preventino  Formatting of this note might be different from the original.  Drug  HX Current Rx Pre-ABL inefficacy Pre-ABL intolerant Post-ABL inefficacy Post-ABL intolerant max dose/24h M/Y end comments  sotalol                  dofetilide                  flecainide  propafenone                  am  . S/P TKR (total knee replacement), bilateral   . Tinea cruris 04/16/2013  . Type 2 diabetes mellitus with diabetic polyneuropathy, without long-term current use of insulin ([place]) 04/16/2013   Last Assessment & Plan:  Labs today Pt reports well controlled on ambulatory monitoring    Past Surgical History:  Procedure Laterality Date  . CARDIAC ELECTROPHYSIOLOGY STUDY AND ABLATION    . CARDIOVERSION    . CARDIOVERSION N/A 10/10/2018   Procedure: CARDIOVERSION;  Surgeon: [name], MD;  Location: [facility] ENDOSCOPY;  Service: Cardiovascular;   Laterality: N/A;  . CHOLECYSTECTOMY    . PROSTATECTOMY    . REPLACEMENT TOTAL KNEE BILATERAL      Family History  Problem Relation Age of Onset  . Cancer Mother   . Depression Mother   . Early death Mother   . Cancer Father   . Depression Father   . Early death Father      Current Outpatient Medications:  .  atorvastatin (LIPITOR) 20 MG tablet, TAKE 1 TABLET DAILY AT 6 P.M., Disp: 90 tablet, Rfl: 1 .  diltiazem (CARDIZEM CD) 360 MG 24 hr capsule, TAKE 1 CAPSULE DAILY, Disp: 90 capsule, Rfl: 1 .  ELIQUIS 5 MG TABS tablet, TAKE 1 TABLET TWICE A DAY, Disp: 180 tablet, Rfl: 1 .  furosemide (LASIX) 40 MG tablet, Take 1 tablet (40 mg total) by mouth 2 (two) times daily., Disp: 60 tablet, Rfl: 3 .  gabapentin (NEURONTIN) 300 MG capsule, Take 2 capsules (600 mg total) by mouth at bedtime., Disp: 180 capsule, Rfl: 1 .  glucose blood ([employer] TEST STRIPS) test strip, Test 2x dailyDx: type 2 diabetes, uncontrolledfluctuating blood sugars, Disp: 100 each, Rfl: 0 .  glucose blood test strip, Use to test blood sugars once daily as directed Dx:E11.65, Disp: , Rfl:  .  lisinopril (ZESTRIL) 10 MG tablet, Take 1 tablet (10 mg total) by mouth every evening., Disp: 90 tablet, Rfl: 1 .  metFORMIN (GLUCOPHAGE) 500 MG tablet, Take 1 tablet (500 mg total) by mouth 2 (two) times daily with a meal., Disp: 180 tablet, Rfl: 1 .  metoprolol tartrate (LOPRESSOR) 50 MG tablet, Take 100 mg by mouth 2 (two) times daily. , Disp: , Rfl:  .  mometasone (NASONEX) 50 MCG/ACT nasal spray, Place 2 sprays into the nose 2 (two) times daily. , Disp: , Rfl:  .  montelukast (SINGULAIR) 10 MG tablet, Take 10 mg by mouth at bedtime. , Disp: , Rfl:  .  potassium chloride SA (K-DUR) 20 MEQ tablet, Take 1 tablet (20 mEq total) by mouth daily., Disp: 30 tablet, Rfl: 3  EXAM:  VITALS per patient if applicable:  GENERAL: alert, oriented, appears well and in no acute distress, coughing.  HEENT: atraumatic, conjunctiva clear, no  obvious abnormalities on inspection of external nose and ears  NECK: normal movements of the head and neck  LUNGS: Cough sounds productive at times.  Able to speak in complete sentences.  Some wheezing noted.  On inspection no signs of respiratory distress, breathing rate appears normal, no obvious gross SOB or gasping  CV: no obvious cyanosis  MS: moves all visible extremities without noticeable abnormality  PSYCH/NEURO: pleasant and cooperative, no obvious depression or anxiety, speech and thought processing grossly intact  ASSESSMENT AND PLAN:  Discussed the following assessment and plan:  Lower respiratory tract infection  -possibly 2/2 viral etiology, bronchitis, pneumonia,  CHF exacerbation- though denies weight gain or LE edema, COPD- though no smoking hx.  Cannot r/o COVID-19 infection. -discussed obtaining CXR and labs at [facility] or ED.  Pt declines at this time. -given strict precautions. - Plan: benzonatate (TESSALON) 200 MG capsule, Augmentin 875-125 MG tablet  Chronic CHF -ECHO 10/07/2018: EF 50-55% aortic valve tricuspid.  Moderate sclerosis of aortic valve.  R bowing of interatrial septum, suggestive of elevated L atrial pressure. -appears stable at this time as pt denies weight gain -would benefit from BNP to r/o exacerbation given cough, wheezing. -continue current meds:  Cardizem, lopressor, lasix, Kdur -pt given precautions  Persistent Afib -stable -continue current meds including Eliquis, cardizem, lopressor -ablation scheduled 01/09/19 -continue f/u with Cardiology  Pt advised to f/u in 1-2 days.  For worsening symptoms pt to go immediately to the ED.   I discussed the assessment and treatment plan with the patient. The patient was provided an opportunity to ask questions and all were answered. The patient agreed with the plan and demonstrated an understanding of the instructions.   The patient was advised to call back or seek an in-person evaluation if the  symptoms worsen or if the condition fails to improve as anticipated.   Billie Ruddy, MD

## 2018-12-15 NOTE — Telephone Encounter (Signed)
Clinic RN made patient an appointment with Dr. Volanda Napoleon

## 2018-12-15 NOTE — Telephone Encounter (Signed)
Pt called in c/o coughing bring up yellow sputum, wheezing and pressure in the middle of his chest since Friday evening.  He was coughing the whole time I was talking with him.    He also was having some shortness of breath with the coughing.   He has not taken any OTC medications.   "I've coughed until I'm sore and my throat is bothering me too".  I encouraged him to seep on some warm fluids since he did not have any cough medicine in the house.  Denies any fever or COVID or sick exposures.  He does not have a computer so unable to to a virtual visit.  I let him know someone from Dr. Cresenciano Lick office would be calling him to set up an appt after the office opens.     I sent a high priority note to Dr. Ledell Noss office letting them know the protocol is to be seen within 4 hours.    I instructed him to go to the ED if his symptoms got any worse and he was agreeable to this and verbalized understanding of the instructions.   Reason for Disposition . [1] Continuous (nonstop) coughing AND [2] keeps from working or sleeping  Answer Assessment - Initial Assessment Questions 1. RESPIRATORY STATUS: "Describe your breathing?" (e.g., wheezing, shortness of breath, unable to speak, severe coughing)      Friday night coughing starting and feels like someone is sitting on my chest and I'm coughing up yellow stuff. 2. ONSET: "When did this breathing problem begin?"      Friday night 3. PATTERN "Does the difficult breathing come and go, or has it been constant since it started?"      Constant     I can't lay down. 4. SEVERITY: "How bad is your breathing?" (e.g., mild, moderate, severe)    - MILD: No SOB at rest, mild SOB with walking, speaks normally in sentences, can lay down, no retractions, pulse < 100.    - MODERATE: SOB at rest, SOB with minimal exertion and prefers to sit, cannot lie down flat, speaks in phrases, mild retractions, audible wheezing, pulse 100-120.    - SEVERE: Very SOB at  rest, speaks in single words, struggling to breathe, sitting hunched forward, retractions, pulse > 120      Moderate.  5. RECURRENT SYMPTOM: "Have you had difficulty breathing before?" If so, ask: "When was the last time?" and "What happened that time?"      I had this a couple of years ago.    A Z-pak was given and that cured me. 6. CARDIAC HISTORY: "Do you have any history of heart disease?" (e.g., heart attack, angina, bypass surgery, angioplasty)      A. Fib.  I'm for an ablation.   I  7. LUNG HISTORY: "Do you have any history of lung disease?"  (e.g., pulmonary embolus, asthma, emphysema)     No blood clots. 8. CAUSE: "What do you think is causing the breathing problem?"      I was tested for COVID-19 a month ago for a cardioversion and was negative.  9. OTHER SYMPTOMS: "Do you have any other symptoms? (e.g., dizziness, runny nose, cough, chest pain, fever)     Coughing, yellow sputum, wheezing feels like someone is sitting on my chest. I feel the pressure in the middle of my chest.   I just saw Dr. Jerilee Hoh 2 weeks ago and everything was fine.  10. PREGNANCY: "Is there any chance you are  pregnant?" "When was your last menstrual period?"       N/A 11. TRAVEL: "Have you traveled out of the country in the last month?" (e.g., travel history, exposures)       No travels or COVID exposures or sick exposures.  Protocols used: BREATHING DIFFICULTY-A-AH

## 2018-12-17 ENCOUNTER — Emergency Department (HOSPITAL_BASED_OUTPATIENT_CLINIC_OR_DEPARTMENT_OTHER): Payer: Medicare Other

## 2018-12-17 ENCOUNTER — Other Ambulatory Visit: Payer: Self-pay

## 2018-12-17 ENCOUNTER — Inpatient Hospital Stay (HOSPITAL_BASED_OUTPATIENT_CLINIC_OR_DEPARTMENT_OTHER)
Admission: EM | Admit: 2018-12-17 | Discharge: 2018-12-19 | DRG: 202 | Disposition: A | Discharge status: Home / Self Care | Payer: Medicare Other | Attending: Internal Medicine | Admitting: Internal Medicine

## 2018-12-17 ENCOUNTER — Encounter (HOSPITAL_BASED_OUTPATIENT_CLINIC_OR_DEPARTMENT_OTHER): Payer: Self-pay | Admitting: Emergency Medicine

## 2018-12-17 DIAGNOSIS — Z818 Family history of other mental and behavioral disorders: Secondary | ICD-10-CM

## 2018-12-17 DIAGNOSIS — I4819 Other persistent atrial fibrillation: Secondary | ICD-10-CM | POA: Diagnosis present

## 2018-12-17 DIAGNOSIS — Z85828 Personal history of other malignant neoplasm of skin: Secondary | ICD-10-CM | POA: Diagnosis not present

## 2018-12-17 DIAGNOSIS — E872 Acidosis, unspecified: Secondary | ICD-10-CM

## 2018-12-17 DIAGNOSIS — E1169 Type 2 diabetes mellitus with other specified complication: Secondary | ICD-10-CM | POA: Diagnosis present

## 2018-12-17 DIAGNOSIS — E1165 Type 2 diabetes mellitus with hyperglycemia: Secondary | ICD-10-CM | POA: Diagnosis present

## 2018-12-17 DIAGNOSIS — E785 Hyperlipidemia, unspecified: Secondary | ICD-10-CM | POA: Diagnosis not present

## 2018-12-17 DIAGNOSIS — Z7901 Long term (current) use of anticoagulants: Secondary | ICD-10-CM

## 2018-12-17 DIAGNOSIS — E7800 Pure hypercholesterolemia, unspecified: Secondary | ICD-10-CM | POA: Diagnosis present

## 2018-12-17 DIAGNOSIS — I4891 Unspecified atrial fibrillation: Secondary | ICD-10-CM | POA: Diagnosis present

## 2018-12-17 DIAGNOSIS — E861 Hypovolemia: Secondary | ICD-10-CM | POA: Diagnosis present

## 2018-12-17 DIAGNOSIS — D649 Anemia, unspecified: Secondary | ICD-10-CM | POA: Diagnosis not present

## 2018-12-17 DIAGNOSIS — E871 Hypo-osmolality and hyponatremia: Secondary | ICD-10-CM | POA: Diagnosis present

## 2018-12-17 DIAGNOSIS — J9601 Acute respiratory failure with hypoxia: Secondary | ICD-10-CM | POA: Diagnosis present

## 2018-12-17 DIAGNOSIS — I5032 Chronic diastolic (congestive) heart failure: Secondary | ICD-10-CM | POA: Diagnosis present

## 2018-12-17 DIAGNOSIS — J9811 Atelectasis: Secondary | ICD-10-CM | POA: Diagnosis not present

## 2018-12-17 DIAGNOSIS — R059 Cough, unspecified: Secondary | ICD-10-CM

## 2018-12-17 DIAGNOSIS — E1142 Type 2 diabetes mellitus with diabetic polyneuropathy: Secondary | ICD-10-CM | POA: Diagnosis not present

## 2018-12-17 DIAGNOSIS — Z9049 Acquired absence of other specified parts of digestive tract: Secondary | ICD-10-CM

## 2018-12-17 DIAGNOSIS — E538 Deficiency of other specified B group vitamins: Secondary | ICD-10-CM | POA: Diagnosis present

## 2018-12-17 DIAGNOSIS — E663 Overweight: Secondary | ICD-10-CM | POA: Diagnosis not present

## 2018-12-17 DIAGNOSIS — Z20828 Contact with and (suspected) exposure to other viral communicable diseases: Secondary | ICD-10-CM | POA: Diagnosis not present

## 2018-12-17 DIAGNOSIS — E860 Dehydration: Secondary | ICD-10-CM | POA: Diagnosis present

## 2018-12-17 DIAGNOSIS — R05 Cough: Secondary | ICD-10-CM | POA: Diagnosis present

## 2018-12-17 DIAGNOSIS — Z6826 Body mass index (BMI) 26.0-26.9, adult: Secondary | ICD-10-CM | POA: Diagnosis not present

## 2018-12-17 DIAGNOSIS — Z7984 Long term (current) use of oral hypoglycemic drugs: Secondary | ICD-10-CM

## 2018-12-17 DIAGNOSIS — J45901 Unspecified asthma with (acute) exacerbation: Secondary | ICD-10-CM | POA: Diagnosis not present

## 2018-12-17 DIAGNOSIS — J189 Pneumonia, unspecified organism: Secondary | ICD-10-CM | POA: Diagnosis not present

## 2018-12-17 DIAGNOSIS — Z8719 Personal history of other diseases of the digestive system: Secondary | ICD-10-CM

## 2018-12-17 DIAGNOSIS — R630 Anorexia: Secondary | ICD-10-CM | POA: Diagnosis not present

## 2018-12-17 DIAGNOSIS — I110 Hypertensive heart disease with heart failure: Secondary | ICD-10-CM | POA: Diagnosis present

## 2018-12-17 DIAGNOSIS — J45909 Unspecified asthma, uncomplicated: Secondary | ICD-10-CM

## 2018-12-17 DIAGNOSIS — I480 Paroxysmal atrial fibrillation: Secondary | ICD-10-CM | POA: Diagnosis present

## 2018-12-17 DIAGNOSIS — Z96653 Presence of artificial knee joint, bilateral: Secondary | ICD-10-CM | POA: Diagnosis present

## 2018-12-17 DIAGNOSIS — Z79899 Other long term (current) drug therapy: Secondary | ICD-10-CM

## 2018-12-17 HISTORY — DX: Hypo-osmolality and hyponatremia: E87.1

## 2018-12-17 LAB — COMPREHENSIVE METABOLIC PANEL
ALT: 23 U/L (ref 0–44)
AST: 33 U/L (ref 15–41)
Albumin: 4.7 g/dL (ref 3.5–5.0)
Alkaline Phosphatase: 90 U/L (ref 38–126)
Anion gap: 13 (ref 5–15)
BUN: 11 mg/dL (ref 8–23)
CO2: 25 mmol/L (ref 22–32)
Calcium: 9.8 mg/dL (ref 8.9–10.3)
Chloride: 86 mmol/L — ABNORMAL LOW (ref 98–111)
Creatinine, Ser: 1 mg/dL (ref 0.61–1.24)
GFR calc Af Amer: >60 mL/min (ref >60)
GFR calc non Af Amer: >60 mL/min (ref >60)
Glucose, Bld: 111 mg/dL — ABNORMAL HIGH (ref 70–99)
Potassium: 3.5 mmol/L (ref 3.5–5.1)
Sodium: 124 mmol/L — ABNORMAL LOW (ref 135–145)
Total Bilirubin: 1.1 mg/dL (ref 0.3–1.2)
Total Protein: 7.1 g/dL (ref 6.5–8.1)

## 2018-12-17 LAB — TROPONIN I (HIGH SENSITIVITY)
Troponin I (High Sensitivity): 4 ng/L (ref <18)
Troponin I (High Sensitivity): 3 ng/L (ref <18)

## 2018-12-17 LAB — CBC WITH DIFFERENTIAL/PLATELET
Abs Immature Granulocytes: 0.04 10*3/uL (ref 0.00–0.07)
Basophils Absolute: 0.1 10*3/uL (ref 0.0–0.1)
Basophils Relative: 1 %
Eosinophils Absolute: 1.8 10*3/uL — ABNORMAL HIGH (ref 0.0–0.5)
Eosinophils Relative: 21 %
HCT: 36.7 % — ABNORMAL LOW (ref 39.0–52.0)
Hemoglobin: 12.4 g/dL — ABNORMAL LOW (ref 13.0–17.0)
Immature Granulocytes: 1 %
Lymphocytes Relative: 17 %
Lymphs Abs: 1.4 10*3/uL (ref 0.7–4.0)
MCH: 28.7 pg (ref 26.0–34.0)
MCHC: 33.8 g/dL (ref 30.0–36.0)
MCV: 85 fL (ref 80.0–100.0)
Monocytes Absolute: 0.9 10*3/uL (ref 0.1–1.0)
Monocytes Relative: 10 %
Neutro Abs: 4.3 10*3/uL (ref 1.7–7.7)
Neutrophils Relative %: 50 %
Platelets: 218 10*3/uL (ref 150–400)
RBC: 4.32 MIL/uL (ref 4.22–5.81)
RDW: 15.2 % (ref 11.5–15.5)
WBC: 8.4 10*3/uL (ref 4.0–10.5)
nRBC: 0 % (ref 0.0–0.2)

## 2018-12-17 LAB — BRAIN NATRIURETIC PEPTIDE: B Natriuretic Peptide: 249.4 pg/mL — ABNORMAL HIGH (ref 0.0–100.0)

## 2018-12-17 LAB — BASIC METABOLIC PANEL
Anion gap: 16 — ABNORMAL HIGH (ref 5–15)
BUN: 11 mg/dL (ref 8–23)
CO2: 20 mmol/L — ABNORMAL LOW (ref 22–32)
Calcium: 8.7 mg/dL — ABNORMAL LOW (ref 8.9–10.3)
Chloride: 89 mmol/L — ABNORMAL LOW (ref 98–111)
Creatinine, Ser: 1.09 mg/dL (ref 0.61–1.24)
GFR calc Af Amer: >60 mL/min (ref >60)
GFR calc non Af Amer: >60 mL/min (ref >60)
Glucose, Bld: 333 mg/dL — ABNORMAL HIGH (ref 70–99)
Potassium: 3.5 mmol/L (ref 3.5–5.1)
Sodium: 125 mmol/L — ABNORMAL LOW (ref 135–145)

## 2018-12-17 LAB — LACTIC ACID, PLASMA
Lactic Acid, Venous: 2.2 mmol/L (ref 0.5–1.9)
Lactic Acid, Venous: 6.1 mmol/L (ref 0.5–1.9)

## 2018-12-17 LAB — SARS CORONAVIRUS 2 BY RT PCR (HOSPITAL ORDER, PERFORMED IN ~~LOC~~ HOSPITAL LAB): SARS Coronavirus 2: NEGATIVE

## 2018-12-17 LAB — GLUCOSE, CAPILLARY: Glucose-Capillary: 318 mg/dL — ABNORMAL HIGH (ref 70–99)

## 2018-12-17 LAB — PROCALCITONIN: Procalcitonin: <0.1 ng/mL

## 2018-12-17 IMAGING — DX DG CHEST 1V PORT
1 series · 1 of 1 positions shown · non-contrast
Comparison: [DATE]

CLINICAL DATA: Productive cough and shortness of breath for 1 week.

EXAM:
PORTABLE CHEST 1 VIEW

[chest ap]
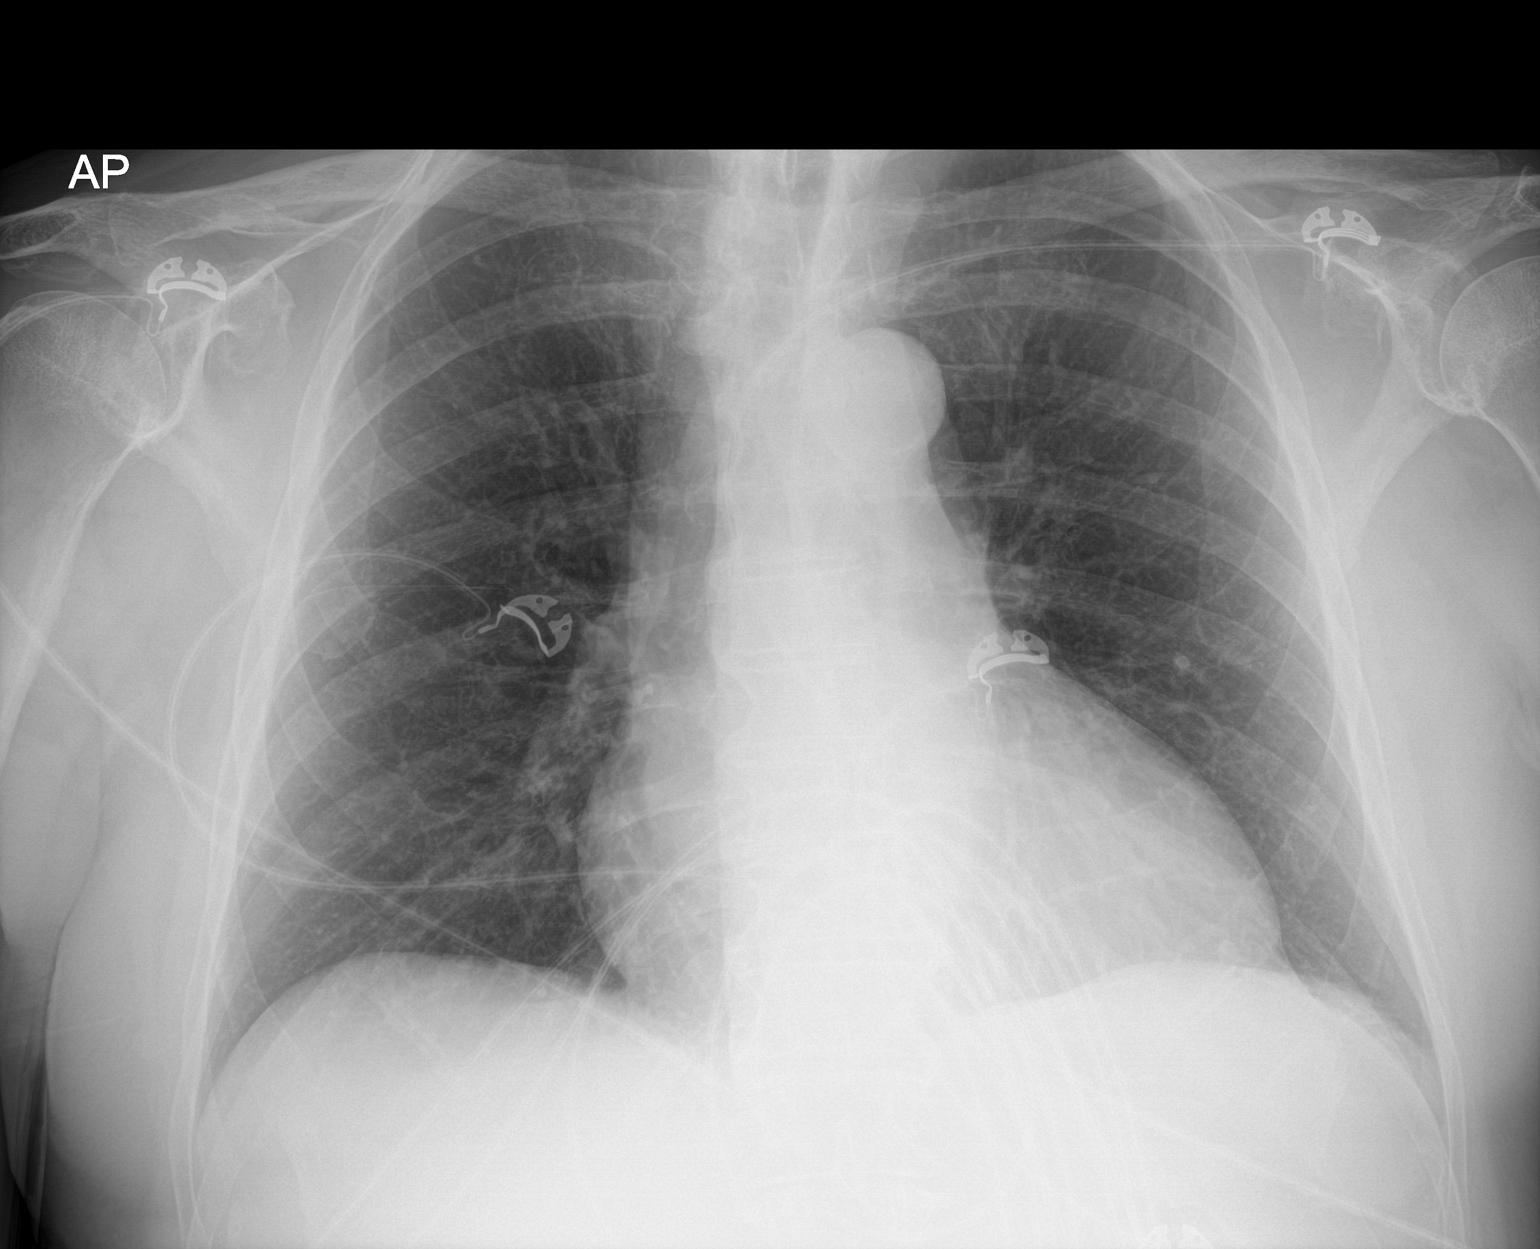

[1 of 1 positions shown; findings below may reference images not displayed]

FINDINGS: The heart is upper limits of normal in size given the AP projection
and portable technique. Normal appearance of the pulmonary hila. The
lungs are clear. No infiltrates, edema or effusions. No pulmonary
lesions. The bony thorax is intact.
IMPRESSION: No acute cardiopulmonary findings.

## 2018-12-17 MED ORDER — SODIUM CHLORIDE 0.9% FLUSH
3.0000 mL | Freq: Two times a day (BID) | INTRAVENOUS | Status: DC
  Administered 2018-12-17: 3 mL via INTRAVENOUS

## 2018-12-17 MED ORDER — ATORVASTATIN CALCIUM 10 MG PO TABS
20.0000 mg | ORAL_TABLET | Freq: Every day | ORAL | Status: DC
  Administered 2018-12-18: 20 mg via ORAL
  Filled 2018-12-17: qty 2

## 2018-12-17 MED ORDER — ALBUTEROL SULFATE HFA 108 (90 BASE) MCG/ACT IN AERS
4.0000 | INHALATION_SPRAY | Freq: Once | RESPIRATORY_TRACT | Status: AC
  Administered 2018-12-17: 4 via RESPIRATORY_TRACT
  Filled 2018-12-17: qty 6.7

## 2018-12-17 MED ORDER — IPRATROPIUM BROMIDE 0.02 % IN SOLN
0.5000 mg | Freq: Once | RESPIRATORY_TRACT | Status: AC
  Administered 2018-12-17: 0.5 mg via RESPIRATORY_TRACT
  Filled 2018-12-17: qty 2.5

## 2018-12-17 MED ORDER — METHYLPREDNISOLONE SODIUM SUCC 125 MG IJ SOLR
125.0000 mg | Freq: Once | INTRAMUSCULAR | Status: AC
  Administered 2018-12-17: 125 mg via INTRAVENOUS
  Filled 2018-12-17: qty 2

## 2018-12-17 MED ORDER — HYDROCODONE-ACETAMINOPHEN 5-325 MG PO TABS
1.0000 | ORAL_TABLET | Freq: Four times a day (QID) | ORAL | Status: DC | PRN
  Administered 2018-12-19: 1 via ORAL
  Filled 2018-12-17: qty 1

## 2018-12-17 MED ORDER — ALBUTEROL SULFATE (2.5 MG/3ML) 0.083% IN NEBU
2.5000 mg | INHALATION_SOLUTION | RESPIRATORY_TRACT | Status: DC | PRN
  Administered 2018-12-18 – 2018-12-19 (×4): 2.5 mg via RESPIRATORY_TRACT
  Filled 2018-12-17 (×5): qty 3

## 2018-12-17 MED ORDER — ACETAMINOPHEN 325 MG PO TABS: 650.0000 mg | ORAL_TABLET | Freq: Four times a day (QID) | ORAL | Status: DC | PRN

## 2018-12-17 MED ORDER — SODIUM CHLORIDE 0.9 % IV SOLN
2.0000 g | INTRAVENOUS | Status: DC
  Administered 2018-12-18: 2 g via INTRAVENOUS
  Filled 2018-12-17: qty 20
  Filled 2018-12-17: qty 2

## 2018-12-17 MED ORDER — SODIUM CHLORIDE 0.9 % IV SOLN
500.0000 mg | Freq: Once | INTRAVENOUS | Status: AC
  Administered 2018-12-17: 500 mg via INTRAVENOUS
  Filled 2018-12-17: qty 500

## 2018-12-17 MED ORDER — SODIUM CHLORIDE 0.9 % IV SOLN
1.0000 g | Freq: Once | INTRAVENOUS | Status: AC
  Administered 2018-12-17: 1 g via INTRAVENOUS
  Filled 2018-12-17: qty 10

## 2018-12-17 MED ORDER — DILTIAZEM HCL ER COATED BEADS 180 MG PO CP24
360.0000 mg | ORAL_CAPSULE | Freq: Every day | ORAL | Status: DC
  Administered 2018-12-18 – 2018-12-19 (×2): 360 mg via ORAL
  Filled 2018-12-17 (×2): qty 2

## 2018-12-17 MED ORDER — SODIUM CHLORIDE 0.9 % IV SOLN
INTRAVENOUS | Status: DC
  Administered 2018-12-17: 15:00:00 via INTRAVENOUS

## 2018-12-17 MED ORDER — GABAPENTIN 300 MG PO CAPS
600.0000 mg | ORAL_CAPSULE | Freq: Every day | ORAL | Status: DC
  Administered 2018-12-17 – 2018-12-18 (×2): 600 mg via ORAL
  Filled 2018-12-17 (×2): qty 2

## 2018-12-17 MED ORDER — APIXABAN 5 MG PO TABS
5.0000 mg | ORAL_TABLET | Freq: Two times a day (BID) | ORAL | Status: DC
  Administered 2018-12-17 – 2018-12-19 (×4): 5 mg via ORAL
  Filled 2018-12-17 (×4): qty 1

## 2018-12-17 MED ORDER — SODIUM CHLORIDE 0.9 % IV BOLUS
500.0000 mL | Freq: Once | INTRAVENOUS | Status: AC
  Administered 2018-12-17: 500 mL via INTRAVENOUS

## 2018-12-17 MED ORDER — METHYLPREDNISOLONE SODIUM SUCC 125 MG IJ SOLR
60.0000 mg | Freq: Four times a day (QID) | INTRAMUSCULAR | Status: DC
  Administered 2018-12-17 – 2018-12-18 (×3): 60 mg via INTRAVENOUS
  Filled 2018-12-17 (×3): qty 2

## 2018-12-17 MED ORDER — POLYETHYLENE GLYCOL 3350 17 G PO PACK: 17.0000 g | PACK | Freq: Every day | ORAL | Status: DC | PRN

## 2018-12-17 MED ORDER — INSULIN ASPART 100 UNIT/ML ~~LOC~~ SOLN
0.0000 [IU] | Freq: Three times a day (TID) | SUBCUTANEOUS | Status: DC
  Administered 2018-12-18: 3 [IU] via SUBCUTANEOUS
  Administered 2018-12-18: 5 [IU] via SUBCUTANEOUS

## 2018-12-17 MED ORDER — INSULIN ASPART 100 UNIT/ML ~~LOC~~ SOLN
0.0000 [IU] | Freq: Every day | SUBCUTANEOUS | Status: DC
  Administered 2018-12-17: 4 [IU] via SUBCUTANEOUS

## 2018-12-17 MED ORDER — LISINOPRIL 10 MG PO TABS
10.0000 mg | ORAL_TABLET | Freq: Every evening | ORAL | Status: DC
  Administered 2018-12-18: 10 mg via ORAL
  Filled 2018-12-17: qty 1

## 2018-12-17 MED ORDER — SODIUM CHLORIDE 0.9 % IV SOLN
INTRAVENOUS | Status: AC
  Administered 2018-12-17 – 2018-12-18 (×2): via INTRAVENOUS

## 2018-12-17 MED ORDER — ONDANSETRON HCL 4 MG/2ML IJ SOLN: 4.0000 mg | Freq: Four times a day (QID) | INTRAMUSCULAR | Status: DC | PRN

## 2018-12-17 MED ORDER — METOPROLOL TARTRATE 50 MG PO TABS
100.0000 mg | ORAL_TABLET | Freq: Two times a day (BID) | ORAL | Status: DC
  Administered 2018-12-17 – 2018-12-19 (×4): 100 mg via ORAL
  Filled 2018-12-17 (×4): qty 2

## 2018-12-17 MED ORDER — ALBUTEROL (5 MG/ML) CONTINUOUS INHALATION SOLN
10.0000 mg/h | INHALATION_SOLUTION | Freq: Once | RESPIRATORY_TRACT | Status: AC
  Administered 2018-12-17: 10 mg/h via RESPIRATORY_TRACT
  Filled 2018-12-17: qty 20

## 2018-12-17 MED ORDER — SODIUM CHLORIDE 0.9 % IV SOLN
500.0000 mg | INTRAVENOUS | Status: DC
  Administered 2018-12-18: 500 mg via INTRAVENOUS
  Filled 2018-12-17 (×2): qty 500

## 2018-12-17 MED ORDER — ONDANSETRON HCL 4 MG PO TABS: 4.0000 mg | ORAL_TABLET | Freq: Four times a day (QID) | ORAL | Status: DC | PRN

## 2018-12-17 MED ORDER — ACETAMINOPHEN 650 MG RE SUPP: 650.0000 mg | Freq: Four times a day (QID) | RECTAL | Status: DC | PRN

## 2018-12-17 MED ORDER — MONTELUKAST SODIUM 10 MG PO TABS
10.0000 mg | ORAL_TABLET | Freq: Every day | ORAL | Status: DC
  Administered 2018-12-17 – 2018-12-18 (×2): 10 mg via ORAL
  Filled 2018-12-17 (×2): qty 1

## 2018-12-17 NOTE — ED Notes (Signed)
ED TO INPATIENT HANDOFF REPORT  ED Nurse Name and Phone #: Marisa Hua RN  S Name/Age/Gender Charles Lloyd. 78 y.o. male Room/Bed: MH09/MH09  Code Status   Code Status: Not on file  Home/SNF/Other Home Patient oriented to: self, place, time and situation Is this baseline? Yes   Triage Complete: Triage complete  Chief Complaint SOB, DIARRHEA  Triage Note Pt states he has cough with some sob since last week.  Pt denies any known covid exposures.  Pt did e visit and was placed on amoxicillin and tessalon, mucinex.  Pt states he just cant get any sleep.  Pt saturations low on arrival, placed on O2 at 2L/min   Allergies No Known Allergies  Level of Care/Admitting Diagnosis ED Disposition    ED Disposition Condition Comment   Admit  Hospital Area: [facility] [[id number]]  Level of Care: Telemetry [5]  Admit to tele based on following criteria: Other see comments  Comments: Significant cardiac history (CHF, A. fib) presents with hypoxia, needs close monitoring.  Covid Evaluation: Confirmed COVID Negative  Diagnosis: Acute respiratory failure with [id number]  Admitting Physician: Modena Jansky [[id number]]  Attending Physician: Vernell Leep D [[id number]]  PT Class (Do Not Modify): Observation [104]  PT Acc Code (Do Not Modify): Observation [10022]       B Medical/Surgery History Past Medical History:  Diagnosis Date  . Atrial fibrillation ([place])   . B12 deficiency   . 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 Cancer ([place])   . CHF (congestive heart failure) ([place]) 04/16/2013   Functional class II, ejection fraction 35-40%  . Chronic anticoagulation   . Chronic diastolic (congestive) heart failure (HCC) 04/16/2013   Functional class II, ejection fraction 35-40%  Last Assessment & Plan:  Clinically stable Volume well controlled meds reviewed  . Chronic diastolic CHF (congestive heart failure) ([place])   . Colon polyps  04/16/2013  . Diabetes mellitus without complication ([place])   . Diabetic neuropathy ([place])   . Essential hypertension 01/06/2010   Last Assessment & Plan:  Well controlled Continue med management  . 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  Steroid usage was discontinued several months ago.  Follow up with Dermatology as planned.  Marland Kitchen Heart murmur   . Hypercholesterolemia 01/06/2010   Last Assessment & Plan:  Repeat labs recommended  . Hyperlipidemia   . Hyperlipidemia associated with type 2 diabetes mellitus ([place]) 01/28/2018  . Hypertension   . Infection of prosthetic right knee joint ([place])   . 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 On amiodarone therapy 09/07/2013  . On continuous oral anticoagulation 01/28/2018  . Overweight (BMI 25.0-29.9) 10/22/2016  . Persistent atrial fibrillation ([place]) 04/16/2013   Last Assessment & Plan:  Rate controlled Continue med management eliquis for stroke preventino  Formatting of this note might be different from the original.  Drug  HX Current Rx Pre-ABL inefficacy Pre-ABL intolerant Post-ABL inefficacy Post-ABL intolerant max dose/24h M/Y end comments  sotalol                  dofetilide                  flecainide  propafenone                  am  . S/P TKR (total knee replacement), bilateral   . Tinea cruris 04/16/2013  . Type 2 diabetes mellitus with diabetic polyneuropathy, without long-term current use of insulin ([place]) 04/16/2013   Last Assessment & Plan:  Labs today Pt reports well controlled on ambulatory monitoring   Past Surgical History:  Procedure Laterality Date  . CARDIAC ELECTROPHYSIOLOGY STUDY AND ABLATION    . CARDIOVERSION    . CARDIOVERSION N/A 10/10/2018   Procedure: CARDIOVERSION;  Surgeon: [name], MD;  Location: [facility] ENDOSCOPY;  Service:  Cardiovascular;  Laterality: N/A;  . CHOLECYSTECTOMY    . PROSTATECTOMY    . REPLACEMENT TOTAL KNEE BILATERAL       A IV Location/Drains/Wounds Patient Lines/Drains/Airways Status   Active Line/Drains/Airways    Name:   Placement date:   Placement time:   Site:   Days:   Peripheral IV 12/17/18 Left Antecubital   12/17/18    1152    Antecubital   less than 1   Peripheral IV 12/17/18 Left Forearm   12/17/18    1445    Forearm   less than 1          Intake/Output Last 24 hours  Intake/Output Summary (Last 24 hours) at 12/17/2018 1821 Last data filed at 12/17/2018 1611 Gross per 24 hour  Intake 250.31 ml  Output -  Net 250.31 ml    Labs/Imaging Results for orders placed or performed during the hospital encounter of 12/17/18 (from the past 48 hour(s))  CBC with Differential     Status: Abnormal   Collection Time: 12/17/18 11:51 AM  Result Value Ref Range   WBC 8.4 4.0 - 10.5 K/uL   RBC 4.32 4.22 - 5.81 MIL/uL   Hemoglobin 12.4 (L) 13.0 - 17.0 g/dL   HCT 36.7 (L) 39.0 - 52.0 %   MCV 85.0 80.0 - 100.0 fL   MCH 28.7 26.0 - 34.0 pg   MCHC 33.8 30.0 - 36.0 g/dL   RDW 15.2 11.5 - 15.5 %   Platelets 218 150 - 400 K/uL   nRBC 0.0 0.0 - 0.2 %   Neutrophils Relative % 50 %   Neutro Abs 4.3 1.7 - 7.7 K/uL   Lymphocytes Relative 17 %   Lymphs Abs 1.4 0.7 - 4.0 K/uL   Monocytes Relative 10 %   Monocytes Absolute 0.9 0.1 - 1.0 K/uL   Eosinophils Relative 21 %   Eosinophils Absolute 1.8 (H) 0.0 - 0.5 K/uL   Basophils Relative 1 %   Basophils Absolute 0.1 0.0 - 0.1 K/uL   Immature Granulocytes 1 %   Abs Immature Granulocytes 0.04 0.00 - 0.07 K/uL    Comment: Performed at [facility], [address] [place], [place] [address]  Troponin I (High Sensitivity)     Status: None   Collection Time: 12/17/18 11:51 AM  Result Value Ref Range   Troponin I (High Sensitivity) 4 <18 ng/L    Comment: (NOTE) Elevated high sensitivity troponin I (hsTnI) values and significant   changes across serial measurements may suggest ACS but many other  chronic and acute conditions are known to elevate hsTnI results.  Refer to the "Links" section for chest pain algorithms and additional  guidance. Performed at [facility], [address] [place], [place] [address]   Comprehensive metabolic panel     Status: Abnormal  Collection Time: 12/17/18 11:51 AM  Result Value Ref Range   Sodium 124 (L) 135 - 145 mmol/L   Potassium 3.5 3.5 - 5.1 mmol/L   Chloride 86 (L) 98 - 111 mmol/L   CO2 25 22 - 32 mmol/L   Glucose, Bld 111 (H) 70 - 99 mg/dL   BUN 11 8 - 23 mg/dL   Creatinine, Ser 1.00 0.61 - 1.24 mg/dL   Calcium 9.8 8.9 - 10.3 mg/dL   Total Protein 7.1 6.5 - 8.1 g/dL   Albumin 4.7 3.5 - 5.0 g/dL   AST 33 15 - 41 U/L   ALT 23 0 - 44 U/L   Alkaline Phosphatase 90 38 - 126 U/L   Total Bilirubin 1.1 0.3 - 1.2 mg/dL   GFR calc non Af Amer >60 >60 mL/min   GFR calc Af Amer >60 >60 mL/min   Anion gap 13 5 - 15    Comment: Performed at [facility] at [facility], [address] [place], [place] [address]  Brain natriuretic peptide     Status: Abnormal   Collection Time: 12/17/18 11:51 AM  Result Value Ref Range   B Natriuretic Peptide 249.4 (H) 0.0 - 100.0 pg/mL    Comment: Performed at [facility], [address] [place], [place] [address]  Lactic acid, plasma     Status: Abnormal   Collection Time: 12/17/18 12:24 PM  Result Value Ref Range   Lactic Acid, Venous 2.2 (HH) 0.5 - 1.9 mmol/L    Comment: CRITICAL RESULT CALLED TO, READ BACK BY AND VERIFIED WITH: MARVA SIMMS,RN AT 1305 BY [facility] Performed at [facility], [address] [place], [place] [address]   SARS Coronavirus 2 by RT PCR (hospital order, performed in [place] hospital lab) Nasopharyngeal Nasopharyngeal Swab     Status: None   Collection Time: 12/17/18 12:24 PM   Specimen: Nasopharyngeal Swab  Result Value Ref Range   SARS  Coronavirus 2 NEGATIVE NEGATIVE    Comment: (NOTE) If result is NEGATIVE SARS-CoV-2 target nucleic acids are NOT DETECTED. The SARS-CoV-2 RNA is generally detectable in upper and lower  respiratory specimens during the acute phase of infection. The lowest  concentration of SARS-CoV-2 viral copies this assay can detect is 250  copies / mL. A negative result does not preclude SARS-CoV-2 infection  and should not be used as the sole basis for treatment or other  patient management decisions.  A negative result may occur with  improper specimen collection / handling, submission of specimen other  than nasopharyngeal swab, presence of viral mutation(s) within the  areas targeted by this assay, and inadequate number of viral copies  (<250 copies / mL). A negative result must be combined with clinical  observations, patient history, and epidemiological information. If result is POSITIVE SARS-CoV-2 target nucleic acids are DETECTED. The SARS-CoV-2 RNA is generally detectable in upper and lower  respiratory specimens dur ing the acute phase of infection.  Positive  results are indicative of active infection with SARS-CoV-2.  Clinical  correlation with patient history and other diagnostic information is  necessary to determine patient infection status.  Positive results do  not rule out bacterial infection or co-infection with other viruses. If result is PRESUMPTIVE POSTIVE SARS-CoV-2 nucleic acids MAY BE PRESENT.   A presumptive positive result was obtained on the submitted specimen  and confirmed on repeat testing.  While 2019 novel coronavirus  (SARS-CoV-2) nucleic acids may  be present in the submitted sample  additional confirmatory testing may be necessary for epidemiological  and / or clinical management purposes  to differentiate between  SARS-CoV-2 and other Sarbecovirus currently known to infect humans.  If clinically indicated additional testing with an alternate test  methodology  [phone]) is advised. The SARS-CoV-2 RNA is generally  detectable in upper and lower respiratory sp ecimens during the acute  phase of infection. The expected result is Negative. Fact Sheet for Patients:  StrictlyIdeas.no Fact Sheet for Healthcare Providers: BankingDealers.co.za This test is not yet approved or cleared by the [place] FDA and has been authorized for detection and/or diagnosis of SARS-CoV-2 by FDA under an Emergency Use Authorization (EUA).  This EUA will remain in effect (meaning this test can be used) for the duration of the COVID-19 declaration under Section 564(b)(1) of the Act, 21 U.S.C. section 360bbb-3(b)(1), unless the authorization is terminated or revoked sooner. Performed at [facility], [address] [place], [place] [address]   Troponin I (High Sensitivity)     Status: None   Collection Time: 12/17/18  2:33 PM  Result Value Ref Range   Troponin I (High Sensitivity) 3 <18 ng/L    Comment: (NOTE) Elevated high sensitivity troponin I (hsTnI) values and significant  changes across serial measurements may suggest ACS but many other  chronic and acute conditions are known to elevate hsTnI results.  Refer to the "Links" section for chest pain algorithms and additional  guidance. Performed at [facility], [address] [place], [place] [address]    Dg Chest Portable 1 View  Result Date: 12/17/2018 CLINICAL DATA:  Productive cough and shortness of breath for 1 week. EXAM: PORTABLE CHEST 1 VIEW COMPARISON:  09/22/2018 FINDINGS: The heart is upper limits of normal in size given the AP projection and portable technique. Normal appearance of the pulmonary hila. The lungs are clear. No infiltrates, edema or effusions. No pulmonary lesions. The bony thorax is intact. IMPRESSION: No acute cardiopulmonary findings. Electronically Signed   By: Marijo Sanes M.D.   On: 12/17/2018 12:39     Pending Labs Unresulted Labs (From admission, onward)    Start     Ordered   12/17/18 1154  Blood culture (routine x 2)  BLOOD CULTURE X 2,   STAT     12/17/18 1155          Vitals/Pain Today's Vitals   12/17/18 1700 12/17/18 1707 12/17/18 1723 12/17/18 1730  BP: 135/72   (!) 144/83  Pulse: 92   98  Resp: 15   20  Temp:    98.2 F (36.8 C)  TempSrc:      SpO2: 97%   98%  Weight:      Height:      PainSc:  0-No pain 0-No pain     Isolation Precautions No active isolations  Medications Medications  0.9 %  sodium chloride infusion ( Intravenous New Bag/Given 12/17/18 1508)  albuterol (VENTOLIN HFA) 108 (90 Base) MCG/ACT inhaler 4 puff (4 puffs Inhalation Given 12/17/18 1242)  albuterol (PROVENTIL,VENTOLIN) solution continuous neb (10 mg/hr Nebulization Given 12/17/18 1449)  ipratropium (ATROVENT) nebulizer solution 0.5 mg (0.5 mg Nebulization Given 12/17/18 1449)  cefTRIAXone (ROCEPHIN) 1 g in sodium chloride 0.9 % 100 mL IVPB (0 g Intravenous Stopped 12/17/18 1503)  azithromycin (ZITHROMAX) 500 mg in sodium chloride 0.9 % 250 mL IVPB ( Intravenous Stopped 12/17/18 1607)  methylPREDNISolone sodium succinate (SOLU-MEDROL) 125 mg/2 mL injection  125 mg (125 mg Intravenous Given 12/17/18 1430)    Mobility walks Low fall risk   Focused Assessments none   R Recommendations: See Admitting Provider Note  Report given to:   Additional Notes:

## 2018-12-17 NOTE — ED Triage Notes (Signed)
Pt states he has cough with some sob since last week.  Pt denies any known covid exposures.  Pt did e visit and was placed on amoxicillin and tessalon, mucinex.  Pt states he just cant get any sleep.  Pt saturations low on arrival, placed on O2 at 2L/min

## 2018-12-17 NOTE — Progress Notes (Signed)
TRH Transfer acceptance note  Transferring MD: Dr. Madalyn Rob, EDP at [facility]  78 year old male with PMH of chronic combined CHF, A. fib, DM 2, HTN, HLD, recently seen by PCP on 10/19 for productive cough and wheezing, patient afraid to come to ED due to ongoing Covid pandemic, treated for LR TI with Augmentin and Tessalon, presented to ED due to progressive dyspnea even with minimal exertion and ongoing cough.  On exam patient noted to be hypoxic on room air with bilateral expiratory wheezing.  Chest x-ray negative for pneumonia but reportedly high suspicion for pneumonia based on presentation.  Covid testing negative.  S/p IV Rocephin, azithromycin.  Sodium 124, started on gentle IV fluids.  Does not appear to be volume overloaded.  Accepted to [facility] telemetry bed under observation status. Admitting diagnosis: Acute respiratory failure with hypoxia, LR TI, possible early pneumonia with reactive airway disease and hyponatremia.  Vernell Leep, MD, FACP, [facility]  To contact the attending provider between 7A-7P or the covering provider during after hours 7P-7A, please log into the web site [email or web address] and access using universal [place] password for that web site. If you do not have the password, please call the hospital operator.

## 2018-12-17 NOTE — Progress Notes (Signed)
CRITICAL VALUE ALERT  Critical Value: Lactic Acid 6.1   Date & Time Notied:  12/17/2018 2235  Provider Notified: Dr. Myna Hidalgo @ 2240  Orders Received/Actions taken: 584mL NS Bolus

## 2018-12-17 NOTE — ED Provider Notes (Addendum)
[facility]MENT Provider Note   CSN: [id number] Arrival date & time: 12/17/18  1132     History   Chief Complaint Chief Complaint  Patient presents with  . Cough    HPI [facility]. is a 78 y.o. male.  Presents emergency department with chief complaint of cough, shortness of breath.  Patient states symptoms since last week, noted a productive cough, mostly clear but also has been having some green-yellow sputum.  No blood.  States he has been having shortness of breath, particularly with any sort of exertion.  No fevers.  Called his primary doctor and was started on Augmentin for antibiotics.  No chest pain, no leg swelling or leg pain.  Denies prior history of smoking, COPD or asthma.  No known sick contacts or Covid exposures.     HPI  Past Medical History:  Diagnosis Date  . Atrial fibrillation ([place])   . B12 deficiency   . 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 Cancer ([place])   . CHF (congestive heart failure) ([place]) 04/16/2013   Functional class II, ejection fraction 35-40%  . Chronic anticoagulation   . Chronic diastolic (congestive) heart failure (HCC) 04/16/2013   Functional class II, ejection fraction 35-40%  Last Assessment & Plan:  Clinically stable Volume well controlled meds reviewed  . Chronic diastolic CHF (congestive heart failure) ([place])   . Colon polyps 04/16/2013  . Diabetes mellitus without complication ([place])   . Diabetic neuropathy ([place])   . Essential hypertension 01/06/2010   Last Assessment & Plan:  Well controlled Continue med management  . 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  Steroid usage was discontinued several months ago.  Follow up with Dermatology as planned.  Marland Kitchen Heart murmur   . Hypercholesterolemia 01/06/2010   Last Assessment & Plan:  Repeat labs recommended  . Hyperlipidemia   . Hyperlipidemia associated with type 2 diabetes  mellitus ([place]) 01/28/2018  . Hypertension   . Infection of prosthetic right knee joint ([place])   . 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 On amiodarone therapy 09/07/2013  . On continuous oral anticoagulation 01/28/2018  . Overweight (BMI 25.0-29.9) 10/22/2016  . Persistent atrial fibrillation ([place]) 04/16/2013   Last Assessment & Plan:  Rate controlled Continue med management eliquis for stroke preventino  Formatting of this note might be different from the original.  Drug  HX Current Rx Pre-ABL inefficacy Pre-ABL intolerant Post-ABL inefficacy Post-ABL intolerant max dose/24h M/Y end comments  sotalol                  dofetilide                  flecainide                  propafenone                  am  . S/P TKR (total knee replacement), bilateral   . Tinea cruris 04/16/2013  . Type 2 diabetes mellitus with diabetic polyneuropathy, without long-term current use of insulin ([place]) 04/16/2013   Last Assessment & Plan:  Labs today Pt reports well controlled on ambulatory monitoring    Patient Active Problem List   Diagnosis Date Noted  .  Vitamin D deficiency 07/25/2018  . Cardiomyopathy ([place]) 03/10/2018  . Chronic anticoagulation 03/06/2018  . Hyperlipidemia associated with type 2 diabetes mellitus ([place]) 01/28/2018  . 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 Infected prosthetic knee joint ([place]) 06/24/2017  . B12 deficiency 10/22/2016  . Overweight (BMI 25.0-29.9) 10/22/2016  . 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 Persistent atrial fibrillation ([place]) 04/16/2013  . Chronic combined systolic and diastolic heart failure ([place]) 04/16/2013  . Colon polyps 04/16/2013  . Type 2 diabetes mellitus with diabetic polyneuropathy, without long-term current use of insulin ([place]) 04/16/2013  . Tinea cruris 04/16/2013  . 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 Diabetic neuropathy  ([place]) 07/14/2012  . Insomnia 03/04/2012  . Hypertensive heart disease with heart failure ([place]) 01/06/2010  . Neoplasm of prostate, malignant ([place]) 01/06/2010  . Hypercholesterolemia 01/06/2010    Past Surgical History:  Procedure Laterality Date  . CARDIAC ELECTROPHYSIOLOGY STUDY AND ABLATION    . CARDIOVERSION    . CARDIOVERSION N/A 10/10/2018   Procedure: CARDIOVERSION;  Surgeon: [name], MD;  Location: [facility] ENDOSCOPY;  Service: Cardiovascular;  Laterality: N/A;  . CHOLECYSTECTOMY    . PROSTATECTOMY    . REPLACEMENT TOTAL KNEE BILATERAL          Home Medications    Prior to Admission medications   Medication Sig Start Date End Date Taking? Authorizing Provider  amoxicillin-clavulanate (AUGMENTIN) 875-125 MG tablet Take 1 tablet by mouth 2 (two) times daily for 7 days. 12/15/18 12/22/18  Billie Ruddy, MD  atorvastatin (LIPITOR) 20 MG tablet TAKE 1 TABLET DAILY AT 6 P.M. 07/03/18   Isaac Bliss, [name], MD  benzonatate (TESSALON) 100 MG capsule Take 2 capsules (200 mg total) by mouth 2 (two) times daily as needed for cough. 12/15/18   Billie Ruddy, MD  diltiazem (CARDIZEM CD) 360 MG 24 hr capsule TAKE 1 CAPSULE DAILY 10/14/18   Isaac Bliss, [name], MD  ELIQUIS 5 MG TABS tablet TAKE 1 TABLET TWICE A DAY 06/24/18   Isaac Bliss, [name], MD  furosemide (LASIX) 40 MG tablet Take 1 tablet (40 mg total) by mouth 2 (two) times daily. 10/06/18   Richardo Priest, MD  gabapentin (NEURONTIN) 300 MG capsule Take 2 capsules (600 mg total) by mouth at bedtime. 09/23/18   Isaac Bliss, [name], MD  glucose blood ([employer] TEST STRIPS) test strip Test 2x dailyDx: type 2 diabetes, uncontrolledfluctuating blood sugars 01/28/18   Isaac Bliss, [name], MD  glucose blood test strip Use to test blood sugars once daily as directed Dx:E11.65 01/09/16   [provider]  lisinopril (ZESTRIL) 10 MG tablet Take 1 tablet (10 mg total) by mouth every evening. 11/25/18    Isaac Bliss, [name], MD  metFORMIN (GLUCOPHAGE) 500 MG tablet Take 1 tablet (500 mg total) by mouth 2 (two) times daily with a meal. 07/25/18   Isaac Bliss, [name], MD  metoprolol tartrate (LOPRESSOR) 50 MG tablet Take 100 mg by mouth 2 (two) times daily.  01/20/18   [provider]  mometasone (NASONEX) 50 MCG/ACT nasal spray Place 2 sprays into the nose 2 (two) times daily.     [provider]  montelukast (SINGULAIR) 10 MG tablet Take 10 mg by mouth at bedtime.     [provider]  potassium chloride SA (K-DUR) 20 MEQ tablet Take 1 tablet (20 mEq total) by mouth daily. 10/07/18   Richardo Priest, MD    Family History  Family History  Problem Relation Age of Onset  . Cancer Mother   . Depression Mother   . Early death Mother   . Cancer Father   . Depression Father   . Early death Father     Social History Social History   Tobacco Use  . Smoking status: Never Smoker  . Smokeless tobacco: Never Used  Substance Use Topics  . Alcohol use: Yes    Alcohol/week: 2.0 standard drinks    Types: 2 Glasses of wine per week    Comment: daily  . Drug use: Never     Allergies   Patient has no known allergies.   Review of Systems Review of Systems  Constitutional: Positive for chills. Negative for fever.  HENT: Negative for ear pain and sore throat.   Eyes: Negative for pain and visual disturbance.  Respiratory: Positive for cough and shortness of breath.   Cardiovascular: Negative for chest pain and palpitations.  Gastrointestinal: Negative for abdominal pain and vomiting.  Genitourinary: Negative for dysuria and hematuria.  Musculoskeletal: Negative for arthralgias and back pain.  Skin: Negative for color change and rash.  Neurological: Negative for seizures and syncope.  All other systems reviewed and are negative.  Physical Exam Updated Vital Signs BP 122/81   Pulse 70   Temp 98.6 F (37 C) (Oral)   Resp 14   Ht 5\' 10"  (1.778 m)    Wt 83.9 kg   SpO2 100%   BMI 26.54 kg/m   Physical Exam Constitutional:      Comments: Noted mild tachypnea but no significant distress  HENT:     Head: Normocephalic and atraumatic.     Nose: Nose normal.     Mouth/Throat:     Mouth: Mucous membranes are moist.     Pharynx: Oropharynx is clear.  Eyes:     Extraocular Movements: Extraocular movements intact.     Pupils: Pupils are equal, round, and reactive to light.  Neck:     Musculoskeletal: Normal range of motion.  Cardiovascular:     Rate and Rhythm: Normal rate.     Pulses: Normal pulses.     Heart sounds: Normal heart sounds.  Pulmonary:     Comments: Mild increased work of breathing, mild tachypnea, bilateral expiratory wheezing, prolonged expiratory phase Abdominal:     General: Abdomen is flat. Bowel sounds are normal. There is no distension.     Palpations: There is no mass.  Musculoskeletal:        General: No swelling or tenderness.  Skin:    General: Skin is warm and dry.     Capillary Refill: Capillary refill takes less than 2 seconds.  Neurological:     General: No focal deficit present.     Mental Status: He is oriented to person, place, and time.  Psychiatric:        Mood and Affect: Mood normal.      ED Treatments / Results  Labs (all labs ordered are listed, but only abnormal results are displayed) Labs Reviewed  CBC WITH DIFFERENTIAL/PLATELET - Abnormal; Notable for the following components:      Result Value   Hemoglobin 12.4 (*)    HCT 36.7 (*)    Eosinophils Absolute 1.8 (*)    All other components within normal limits  COMPREHENSIVE METABOLIC PANEL - Abnormal; Notable for the following components:   Sodium 124 (*)    Chloride 86 (*)    Glucose, Bld 111 (*)    All other  components within normal limits  LACTIC ACID, PLASMA - Abnormal; Notable for the following components:   Lactic Acid, Venous 2.2 (*)    All other components within normal limits  BRAIN NATRIURETIC PEPTIDE -  Abnormal; Notable for the following components:   B Natriuretic Peptide 249.4 (*)    All other components within normal limits  SARS CORONAVIRUS 2 BY RT PCR (HOSPITAL ORDER, [facility])  CULTURE, BLOOD (ROUTINE X 2)  CULTURE, BLOOD (ROUTINE X 2)  LACTIC ACID, PLASMA  TROPONIN I (HIGH SENSITIVITY)  TROPONIN I (HIGH SENSITIVITY)    EKG EKG Interpretation  Date/Time:  Wednesday December 17 2018 11:44:15 EDT Ventricular Rate:  72 PR Interval:    QRS Duration: 108 QT Interval:  397 QTC Calculation: 435 R Axis:   61 Text Interpretation:  Atrial fibrillation Low voltage, precordial leads Confirmed by Madalyn Rob [phone]) on 12/17/2018 12:32:07 PM   Radiology Dg Chest Portable 1 View  Result Date: 12/17/2018 CLINICAL DATA:  Productive cough and shortness of breath for 1 week. EXAM: PORTABLE CHEST 1 VIEW COMPARISON:  09/22/2018 FINDINGS: The heart is upper limits of normal in size given the AP projection and portable technique. Normal appearance of the pulmonary hila. The lungs are clear. No infiltrates, edema or effusions. No pulmonary lesions. The bony thorax is intact. IMPRESSION: No acute cardiopulmonary findings. Electronically Signed   By: Marijo Sanes M.D.   On: 12/17/2018 12:39    Procedures .Critical Care Performed by: Lucrezia Starch, MD Authorized by: Lucrezia Starch, MD   Critical care provider statement:    Critical care time (minutes):  45   Critical care was necessary to treat or prevent imminent or life-threatening deterioration of the following conditions:  Respiratory failure   Critical care was time spent personally by me on the following activities:  Discussions with consultants, evaluation of patient's response to treatment, examination of patient, ordering and performing treatments and interventions, ordering and review of laboratory studies, ordering and review of radiographic studies, pulse oximetry, re-evaluation of patient's  condition, obtaining history from patient or surrogate and review of old charts   (including critical care time)  Medications Ordered in ED Medications  albuterol (PROVENTIL,VENTOLIN) solution continuous neb (has no administration in time range)  ipratropium (ATROVENT) nebulizer solution 0.5 mg (has no administration in time range)  cefTRIAXone (ROCEPHIN) 1 g in sodium chloride 0.9 % 100 mL IVPB (1 g Intravenous New Bag/Given 12/17/18 1433)  azithromycin (ZITHROMAX) 500 mg in sodium chloride 0.9 % 250 mL IVPB (has no administration in time range)  0.9 %  sodium chloride infusion (has no administration in time range)  albuterol (VENTOLIN HFA) 108 (90 Base) MCG/ACT inhaler 4 puff (4 puffs Inhalation Given 12/17/18 1242)  methylPREDNISolone sodium succinate (SOLU-MEDROL) 125 mg/2 mL injection 125 mg (125 mg Intravenous Given 12/17/18 1430)     Initial Impression / Assessment and Plan / ED Course  I have reviewed the triage vital signs and the nursing notes.  Pertinent labs & imaging results that were available during my care of the patient were reviewed by me and considered in my medical decision making (see chart for details).  Clinical Course as of Dec 17 1451  Wed Dec 17, 2018  1205 Completed initial assessment   [RD]  1441 Recheck patient, updated on results, will give hour-long neb therapy and reassess.  Given respiratory condition, believe patient would benefit from admission, will consult hospitalist for further management   [RD]  1452 Discussed  with hospitalist service who agrees for admission, will go to [facility] long telemetry bed   [RD]    Clinical Course User Index [RD] Lucrezia Starch, MD       78 year old male who presents to the emergency department with chief complaint of cough, difficulty breathing.  On exam patient is noted to have hypoxia on room air, bilateral expiratory wheezing.  Concern for respiratory illness causing reactive airway exacerbation.  No prior  history of COPD or asthma.  Provided with albuterol, steroids.  Chest x-ray was negative for pneumonia but have high clinical suspicion for respiratory illness based on symptoms.  Will treat with Rocephin, azithromycin.  Covid was negative.  Given new oxygen requirement, believe patient would benefit from admission to hospital.  On labs noted sodium 124.  Started normal saline for hydration.  Discussed case with hospitalist service who agrees to admit at [facility] long.  Final Clinical Impressions(s) / ED Diagnoses   Final diagnoses:  Acute respiratory failure with hypoxia ([place])  Reactive airway disease with acute exacerbation, unspecified asthma severity, unspecified whether persistent  Community acquired pneumonia, unspecified laterality  Hyponatremia    ED Discharge Orders    None       Lucrezia Starch, MD 12/17/18 1458    Lucrezia Starch, MD 12/17/18 1459

## 2018-12-17 NOTE — H&P (Addendum)
History and Physical    Charles Lloyd. [id number] DOB: [birth year] DOA: 12/17/2018  PCP: Charles Lloyd, Charles Halsted, MD   Patient coming from: Home   Chief Complaint: SOB, productive cough, wheezing  HPI: Charles Lloyd. is a 78 y.o. male with medical history significant for atrial fibrillation on Eliquis, hypertension, type 2 diabetes mellitus, chronic diastolic CHF, now presenting to the emergency department for evaluation of shortness of breath, wheezing, and productive cough.  Patient reports that his symptoms began approximately 5 days ago with general malaise, cough, and shortness of breath.  He has gone on to develop cough productive of thick green and yellow sputum, worsening dyspnea, and loss of appetite.  He had a telemedicine visit on 12/15/2018 and was prescribed Augmentin and cough medications.  He developed loose stools, but his shortness of breath and cough continued to worsen.  He has not had any fevers or chills associated with this.  He denies any associated chest pain.  He does report occasional palpitations.  He reports recent orthopnea, but notes that his weight was down a couple pounds, he has had very poor appetite and poor intake for a few days, and has also had loose stools.  He takes Singulair nightly and was previously on Breo, but denies any smoking history or history of asthma.  [facility] ED Course: Upon arrival to the ED, patient is found to be afebrile, saturating mid to upper 80s on room air, mildly tachypneic, and with stable blood pressure.  EKG features atrial fibrillation and chest x-ray is negative for acute cardiopulmonary disease.  Chemistry panel is concerning for sodium of 124.  CBC is unremarkable.  COVID-19 is negative.  Lactic acid slightly elevated at 2.2.  BNP elevated to 249.  Patient was given normal saline, IV Solu-Medrol, albuterol, Atrovent, Rocephin, and azithromycin in the emergency department.  Review of Systems:  All  other systems reviewed and apart from HPI, are negative.  Past Medical History:  Diagnosis Date  . Atrial fibrillation ([place])   . B12 deficiency   . 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 Cancer ([place])   . CHF (congestive heart failure) ([place]) 04/16/2013   Functional class II, ejection fraction 35-40%  . Chronic anticoagulation   . Chronic diastolic (congestive) heart failure (HCC) 04/16/2013   Functional class II, ejection fraction 35-40%  Last Assessment & Plan:  Clinically stable Volume well controlled meds reviewed  . Chronic diastolic CHF (congestive heart failure) ([place])   . Colon polyps 04/16/2013  . Diabetes mellitus without complication ([place])   . Diabetic neuropathy ([place])   . Essential hypertension 01/06/2010   Last Assessment & Plan:  Well controlled Continue med management  . 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  Steroid usage was discontinued several months ago.  Follow up with Dermatology as planned.  [place] Kitchen Heart murmur   . Hypercholesterolemia 01/06/2010   Last Assessment & Plan:  Repeat labs recommended  . Hyperlipidemia   . Hyperlipidemia associated with type 2 diabetes mellitus ([place]) 01/28/2018  . Hypertension   . Infection of prosthetic right knee joint ([place])   . 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 On amiodarone therapy 09/07/2013  . On continuous oral anticoagulation 01/28/2018  . Overweight (BMI 25.0-29.9)  10/22/2016  . Persistent atrial fibrillation ([place]) 04/16/2013   Last Assessment & Plan:  Rate controlled Continue med management eliquis for stroke preventino  Formatting of this note might be different from the original.  Drug  HX Current Rx Pre-ABL inefficacy Pre-ABL intolerant Post-ABL inefficacy Post-ABL intolerant max dose/24h M/Y end comments  sotalol                   dofetilide                  flecainide                  propafenone                  am  . S/P TKR (total knee replacement), bilateral   . Tinea cruris 04/16/2013  . Type 2 diabetes mellitus with diabetic polyneuropathy, without long-term current use of insulin ([place]) 04/16/2013   Last Assessment & Plan:  Labs today Pt reports well controlled on ambulatory monitoring    Past Surgical History:  Procedure Laterality Date  . CARDIAC ELECTROPHYSIOLOGY STUDY AND ABLATION    . CARDIOVERSION    . CARDIOVERSION N/A 10/10/2018   Procedure: CARDIOVERSION;  Surgeon: [name], MD;  Location: [facility] ENDOSCOPY;  Service: Cardiovascular;  Laterality: N/A;  . CHOLECYSTECTOMY    . PROSTATECTOMY    . REPLACEMENT TOTAL KNEE BILATERAL       reports that he has never smoked. He has never used smokeless tobacco. He reports current alcohol use of about 2.0 standard drinks of alcohol per week. He reports that he does not use drugs.  No Known Allergies  Family History  Problem Relation Age of Onset  . Cancer Mother   . Depression Mother   . Early death Mother   . Cancer Father   . Depression Father   . Early death Father      Prior to Admission medications   Medication Sig Start Date End Date Taking? Authorizing Provider  amoxicillin-clavulanate (AUGMENTIN) 875-125 MG tablet Take 1 tablet by mouth 2 (two) times daily for 7 days. 12/15/18 12/22/18  Billie Ruddy, MD  atorvastatin (LIPITOR) 20 MG tablet TAKE 1 TABLET DAILY AT 6 P.M. 07/03/18   Charles Lloyd, Charles Halsted, MD  benzonatate (TESSALON) 100 MG capsule Take 2 capsules (200 mg total) by mouth 2 (two) times daily as needed for cough. 12/15/18   Billie Ruddy, MD  diltiazem (CARDIZEM CD) 360 MG 24 hr capsule TAKE 1 CAPSULE DAILY 10/14/18   Charles Lloyd, Charles Halsted, MD  ELIQUIS 5 MG TABS tablet TAKE 1 TABLET TWICE A DAY 06/24/18   Charles Lloyd, Charles Halsted, MD  furosemide (LASIX) 40 MG tablet Take 1 tablet (40 mg total) by mouth 2 (two) times  daily. 10/06/18   Richardo Priest, MD  gabapentin (NEURONTIN) 300 MG capsule Take 2 capsules (600 mg total) by mouth at bedtime. 09/23/18   Charles Lloyd, Charles Halsted, MD  glucose blood ([employer] TEST STRIPS) test strip Test 2x dailyDx: type 2 diabetes, uncontrolledfluctuating blood sugars 01/28/18   Charles Lloyd, Charles Halsted, MD  glucose blood test strip Use to test blood sugars once daily as directed Dx:E11.65 01/09/16   [provider]  lisinopril (ZESTRIL) 10 MG tablet Take 1 tablet (10 mg total) by mouth every evening. 11/25/18   Charles Lloyd, Charles Halsted, MD  metFORMIN (GLUCOPHAGE) 500 MG tablet Take 1 tablet (500 mg total) by mouth 2 (two) times daily with  a meal. 07/25/18   Charles Lloyd, Charles Halsted, MD  metoprolol tartrate (LOPRESSOR) 50 MG tablet Take 100 mg by mouth 2 (two) times daily.  01/20/18   [provider]  mometasone (NASONEX) 50 MCG/ACT nasal spray Place 2 sprays into the nose 2 (two) times daily.     [provider]  montelukast (SINGULAIR) 10 MG tablet Take 10 mg by mouth at bedtime.     [provider]  potassium chloride SA (K-DUR) 20 MEQ tablet Take 1 tablet (20 mEq total) by mouth daily. 10/07/18   Richardo Priest, MD    Physical Exam: Vitals:   12/17/18 1700 12/17/18 1730 12/17/18 1900 12/17/18 2050  BP: 135/72 (!) 144/83 (!) 129/100 134/78  Pulse: 92 98 100 99  Resp: 15 20 20 16   Temp:  98.2 F (36.8 C)  97.8 F (36.6 C)  TempSrc:    Oral  SpO2: 97% 98% 98% 100%  Weight:      Height:        Constitutional: NAD, calm  Eyes: PERTLA, lids and conjunctivae normal ENMT: Mucous membranes are moist. Posterior pharynx clear of any exudate or lesions.   Neck: normal, supple, no masses, no thyromegaly Respiratory: Mild tachypnea. Occasional wheeze. Speaking full sentances. No pallor.  Cardiovascular: Rate ~100 and irregularly irregular. No JVD. Abdomen: No distension, no tenderness, soft. Bowel sounds normal.  Musculoskeletal: no  clubbing / cyanosis. No joint deformity upper and lower extremities.  Skin: no significant rashes, lesions, ulcers. Warm, dry, well-perfused. Neurologic: CN 2-12 grossly intact. Sensation intact. Strength 5/5 in all 4 limbs.  Psychiatric: Alert and oriented x 3. Pleasant, cooperative.    Labs on Admission: I have personally reviewed following labs and imaging studies  CBC: Recent Labs  Lab 12/17/18 1151  WBC 8.4  NEUTROABS 4.3  HGB 12.4*  HCT 36.7*  MCV 85.0  PLT 99991111   Basic Metabolic Panel: Recent Labs  Lab 12/17/18 1151  NA 124*  K 3.5  CL 86*  CO2 25  GLUCOSE 111*  BUN 11  CREATININE 1.00  CALCIUM 9.8   GFR: Estimated Creatinine Clearance: 65.9 mL/min (by C-G formula based on SCr of 1 mg/dL). Liver Function Tests: Recent Labs  Lab 12/17/18 1151  AST 33  ALT 23  ALKPHOS 90  BILITOT 1.1  PROT 7.1  ALBUMIN 4.7   No results for input(s): LIPASE, AMYLASE in the last 168 hours. No results for input(s): AMMONIA in the last 168 hours. Coagulation Profile: No results for input(s): INR, PROTIME in the last 168 hours. Cardiac Enzymes: No results for input(s): CKTOTAL, CKMB, CKMBINDEX, TROPONINI in the last 168 hours. BNP (last 3 results) Recent Labs    09/22/18 1107 10/06/18 0921 11/06/18 1005  PROBNP 289.0* 957* 1,062*   HbA1C: No results for input(s): HGBA1C in the last 72 hours. CBG: No results for input(s): GLUCAP in the last 168 hours. Lipid Profile: No results for input(s): CHOL, HDL, LDLCALC, TRIG, CHOLHDL, LDLDIRECT in the last 72 hours. Thyroid Function Tests: No results for input(s): TSH, T4TOTAL, FREET4, T3FREE, THYROIDAB in the last 72 hours. Anemia Panel: No results for input(s): VITAMINB12, FOLATE, FERRITIN, TIBC, IRON, RETICCTPCT in the last 72 hours. Urine analysis: No results found for: COLORURINE, APPEARANCEUR, LABSPEC, PHURINE, GLUCOSEU, HGBUR, BILIRUBINUR, KETONESUR, PROTEINUR, UROBILINOGEN, NITRITE, LEUKOCYTESUR Sepsis Labs:  @[email or web address] (procalcitonin:4,lacticidven:4) ) Recent Results (from the past 240 hour(s))  SARS Coronavirus 2 by RT PCR (hospital order, performed in [facility] hospital lab) Nasopharyngeal Nasopharyngeal Swab  Status: None   Collection Time: 12/17/18 12:24 PM   Specimen: Nasopharyngeal Swab  Result Value Ref Range Status   SARS Coronavirus 2 NEGATIVE NEGATIVE Final    Comment: (NOTE) If result is NEGATIVE SARS-CoV-2 target nucleic acids are NOT DETECTED. The SARS-CoV-2 RNA is generally detectable in upper and lower  respiratory specimens during the acute phase of infection. The lowest  concentration of SARS-CoV-2 viral copies this assay can detect is 250  copies / mL. A negative result does not preclude SARS-CoV-2 infection  and should not be used as the sole basis for treatment or other  patient management decisions.  A negative result may occur with  improper specimen collection / handling, submission of specimen other  than nasopharyngeal swab, presence of viral mutation(s) within the  areas targeted by this assay, and inadequate number of viral copies  (<250 copies / mL). A negative result must be combined with clinical  observations, patient history, and epidemiological information. If result is POSITIVE SARS-CoV-2 target nucleic acids are DETECTED. The SARS-CoV-2 RNA is generally detectable in upper and lower  respiratory specimens dur ing the acute phase of infection.  Positive  results are indicative of active infection with SARS-CoV-2.  Clinical  correlation with patient history and other diagnostic information is  necessary to determine patient infection status.  Positive results do  not rule out bacterial infection or co-infection with other viruses. If result is PRESUMPTIVE POSTIVE SARS-CoV-2 nucleic acids MAY BE PRESENT.   A presumptive positive result was obtained on the submitted specimen  and confirmed on repeat testing.  While 2019 novel coronavirus   (SARS-CoV-2) nucleic acids may be present in the submitted sample  additional confirmatory testing may be necessary for epidemiological  and / or clinical management purposes  to differentiate between  SARS-CoV-2 and other Sarbecovirus currently known to infect humans.  If clinically indicated additional testing with an alternate test  methodology [phone]) is advised. The SARS-CoV-2 RNA is generally  detectable in upper and lower respiratory sp ecimens during the acute  phase of infection. The expected result is Negative. Fact Sheet for Patients:  StrictlyIdeas.no Fact Sheet for Healthcare Providers: BankingDealers.co.za This test is not yet approved or cleared by the [place] FDA and has been authorized for detection and/or diagnosis of SARS-CoV-2 by FDA under an Emergency Use Authorization (EUA).  This EUA will remain in effect (meaning this test can be used) for the duration of the COVID-19 declaration under Section 564(b)(1) of the Act, 21 U.S.C. section 360bbb-3(b)(1), unless the authorization is terminated or revoked sooner. Performed at [facility] [address] [place], [place] [address]      Radiological Exams on Admission: Dg Chest Portable 1 View  Result Date: 12/17/2018 CLINICAL DATA:  Productive cough and shortness of breath for 1 week. EXAM: PORTABLE CHEST 1 VIEW COMPARISON:  09/22/2018 FINDINGS: The heart is upper limits of normal in size given the AP projection and portable technique. Normal appearance of the pulmonary hila. The lungs are clear. No infiltrates, edema or effusions. No pulmonary lesions. The bony thorax is intact. IMPRESSION: No acute cardiopulmonary findings. Electronically Signed   By: Marijo Sanes M.D.   On: 12/17/2018 12:39    EKG: Independently reviewed. Atrial fibrillation.   Assessment/Plan   1. Acute hypoxic respiratory failure; bronchospasm  - Presents with several days of  progressive SOB, productive cough, and wheezing, and is found to have new supplemental O2 requirement, no fever or leukocytosis, and no acute findings  on CXR  - He reports marked improvement with bronchodilators in ED  - He denies hx of COPD or asthma but was previously using Breo  - Likely precipitated by infection, possibly viral, COVID-19 is negative  - Check sputum culture, check procalcitonin, continue albuterol nebs, continue systemic steroid, continue supplemental O2 as needed    ADDENDUM: Lactic acid has risen to 6.1. This is surprising as pt is well-appearing. Metformin may be contributing. Plan to give a NS bolus now and repeat in a couple hours, continue antibiotics, follow cultures.   2. Hyponatremia  - Serum sodium is 124 on admission, down from 142 in September 2020  - He appears hypovolemic, reports recent anorexia and loose stools, and was started on NS infusion in ED  - Check urine sodium and urine osm, follow serial chem panels to avoid overly rapid correction, continue NS infusion, hold Lasix   3. Chronic diastolic CHF  - Patient reports recent orthopnea and has elevated BNP but appears hypovolemic and reports wt is down 2 lbs, he has had anorexia for a few days, and loose stools - Continue gentle IVF hydration with NS, hold Lasix, continue beta-blocker and ACE, follow daily wts    4. Atrial fibrillation  - In rate-controlled a fib on admission  - CHADS-VASc is 52 (age x2, CHF, DM, HTN) - Continue Eliquis, diltiazem, metoprolol   5. Type II DM  - A1c was 6.3% in September 2020  - Managed with metformin at home, held on admission  - He is on systemic steroids  - Check CBG's and use a low-intensity SSI with Novolog while in hospital     PPE: Mask, face shield  DVT prophylaxis: Eliquis  Code Status: Full  Family Communication: Discussed with patient  Consults called: None  Admission status: Observation     Vianne Bulls, MD [facility] Pager [phone]   If 7PM-7AM, please contact night-coverage [email or web address] Password [email or web address]  12/17/2018, 9:12 PM

## 2018-12-18 DIAGNOSIS — E1165 Type 2 diabetes mellitus with hyperglycemia: Secondary | ICD-10-CM | POA: Diagnosis present

## 2018-12-18 DIAGNOSIS — Z6826 Body mass index (BMI) 26.0-26.9, adult: Secondary | ICD-10-CM | POA: Diagnosis not present

## 2018-12-18 DIAGNOSIS — Z8719 Personal history of other diseases of the digestive system: Secondary | ICD-10-CM | POA: Diagnosis not present

## 2018-12-18 DIAGNOSIS — E861 Hypovolemia: Secondary | ICD-10-CM | POA: Diagnosis present

## 2018-12-18 DIAGNOSIS — R05 Cough: Secondary | ICD-10-CM | POA: Diagnosis present

## 2018-12-18 DIAGNOSIS — I5032 Chronic diastolic (congestive) heart failure: Secondary | ICD-10-CM | POA: Diagnosis present

## 2018-12-18 DIAGNOSIS — Z20828 Contact with and (suspected) exposure to other viral communicable diseases: Secondary | ICD-10-CM | POA: Diagnosis present

## 2018-12-18 DIAGNOSIS — J45901 Unspecified asthma with (acute) exacerbation: Principal | ICD-10-CM

## 2018-12-18 DIAGNOSIS — E871 Hypo-osmolality and hyponatremia: Secondary | ICD-10-CM

## 2018-12-18 DIAGNOSIS — R630 Anorexia: Secondary | ICD-10-CM | POA: Diagnosis present

## 2018-12-18 DIAGNOSIS — I4819 Other persistent atrial fibrillation: Secondary | ICD-10-CM | POA: Diagnosis present

## 2018-12-18 DIAGNOSIS — E860 Dehydration: Secondary | ICD-10-CM | POA: Diagnosis present

## 2018-12-18 DIAGNOSIS — E872 Acidosis, unspecified: Secondary | ICD-10-CM

## 2018-12-18 DIAGNOSIS — Z7901 Long term (current) use of anticoagulants: Secondary | ICD-10-CM | POA: Diagnosis not present

## 2018-12-18 DIAGNOSIS — J45909 Unspecified asthma, uncomplicated: Secondary | ICD-10-CM

## 2018-12-18 DIAGNOSIS — E1142 Type 2 diabetes mellitus with diabetic polyneuropathy: Secondary | ICD-10-CM | POA: Diagnosis present

## 2018-12-18 DIAGNOSIS — E7800 Pure hypercholesterolemia, unspecified: Secondary | ICD-10-CM | POA: Diagnosis present

## 2018-12-18 DIAGNOSIS — E538 Deficiency of other specified B group vitamins: Secondary | ICD-10-CM | POA: Diagnosis present

## 2018-12-18 DIAGNOSIS — J9811 Atelectasis: Secondary | ICD-10-CM | POA: Diagnosis present

## 2018-12-18 DIAGNOSIS — D649 Anemia, unspecified: Secondary | ICD-10-CM | POA: Diagnosis not present

## 2018-12-18 DIAGNOSIS — E785 Hyperlipidemia, unspecified: Secondary | ICD-10-CM | POA: Diagnosis present

## 2018-12-18 DIAGNOSIS — Z85828 Personal history of other malignant neoplasm of skin: Secondary | ICD-10-CM | POA: Diagnosis not present

## 2018-12-18 DIAGNOSIS — I110 Hypertensive heart disease with heart failure: Secondary | ICD-10-CM | POA: Diagnosis present

## 2018-12-18 DIAGNOSIS — E1169 Type 2 diabetes mellitus with other specified complication: Secondary | ICD-10-CM | POA: Diagnosis present

## 2018-12-18 DIAGNOSIS — J9601 Acute respiratory failure with hypoxia: Secondary | ICD-10-CM | POA: Diagnosis present

## 2018-12-18 DIAGNOSIS — E663 Overweight: Secondary | ICD-10-CM | POA: Diagnosis present

## 2018-12-18 LAB — GLUCOSE, CAPILLARY
Glucose-Capillary: 183 mg/dL — ABNORMAL HIGH (ref 70–99)
Glucose-Capillary: 214 mg/dL — ABNORMAL HIGH (ref 70–99)
Glucose-Capillary: 259 mg/dL — ABNORMAL HIGH (ref 70–99)
Glucose-Capillary: 213 mg/dL — ABNORMAL HIGH (ref 70–99)
Glucose-Capillary: 277 mg/dL — ABNORMAL HIGH (ref 70–99)

## 2018-12-18 LAB — CBC WITH DIFFERENTIAL/PLATELET
Abs Immature Granulocytes: 0.02 10*3/uL (ref 0.00–0.07)
Basophils Absolute: 0 10*3/uL (ref 0.0–0.1)
Basophils Relative: 0 %
Eosinophils Absolute: 0 10*3/uL (ref 0.0–0.5)
Eosinophils Relative: 0 %
HCT: 30.3 % — ABNORMAL LOW (ref 39.0–52.0)
Hemoglobin: 10.3 g/dL — ABNORMAL LOW (ref 13.0–17.0)
Immature Granulocytes: 1 %
Lymphocytes Relative: 7 %
Lymphs Abs: 0.2 10*3/uL — ABNORMAL LOW (ref 0.7–4.0)
MCH: 29.3 pg (ref 26.0–34.0)
MCHC: 34 g/dL (ref 30.0–36.0)
MCV: 86.3 fL (ref 80.0–100.0)
Monocytes Absolute: 0.1 10*3/uL (ref 0.1–1.0)
Monocytes Relative: 2 %
Neutro Abs: 3.1 10*3/uL (ref 1.7–7.7)
Neutrophils Relative %: 90 %
Platelets: 162 10*3/uL (ref 150–400)
RBC: 3.51 MIL/uL — ABNORMAL LOW (ref 4.22–5.81)
RDW: 15.7 % — ABNORMAL HIGH (ref 11.5–15.5)
WBC: 3.4 10*3/uL — ABNORMAL LOW (ref 4.0–10.5)
nRBC: 0 % (ref 0.0–0.2)

## 2018-12-18 LAB — STREP PNEUMONIAE URINARY ANTIGEN: Strep Pneumo Urinary Antigen: NEGATIVE

## 2018-12-18 LAB — SODIUM, URINE, RANDOM: Sodium, Ur: <10 mmol/L

## 2018-12-18 LAB — URINALYSIS, ROUTINE W REFLEX MICROSCOPIC
Bilirubin Urine: NEGATIVE
Glucose, UA: >=500 mg/dL — AB
Hgb urine dipstick: NEGATIVE
Ketones, ur: NEGATIVE mg/dL
Leukocytes,Ua: NEGATIVE
Nitrite: NEGATIVE
Protein, ur: NEGATIVE mg/dL
Specific Gravity, Urine: 1.008 (ref 1.005–1.030)
pH: 6 (ref 5.0–8.0)

## 2018-12-18 LAB — BASIC METABOLIC PANEL
Anion gap: 11 (ref 5–15)
Anion gap: 12 (ref 5–15)
BUN: 11 mg/dL (ref 8–23)
BUN: 13 mg/dL (ref 8–23)
CO2: 20 mmol/L — ABNORMAL LOW (ref 22–32)
CO2: 23 mmol/L (ref 22–32)
Calcium: 8.4 mg/dL — ABNORMAL LOW (ref 8.9–10.3)
Calcium: 9 mg/dL (ref 8.9–10.3)
Chloride: 94 mmol/L — ABNORMAL LOW (ref 98–111)
Chloride: 96 mmol/L — ABNORMAL LOW (ref 98–111)
Creatinine, Ser: 1.01 mg/dL (ref 0.61–1.24)
Creatinine, Ser: 1.06 mg/dL (ref 0.61–1.24)
GFR calc Af Amer: >60 mL/min (ref >60)
GFR calc Af Amer: >60 mL/min (ref >60)
GFR calc non Af Amer: >60 mL/min (ref >60)
GFR calc non Af Amer: >60 mL/min (ref >60)
Glucose, Bld: 241 mg/dL — ABNORMAL HIGH (ref 70–99)
Glucose, Bld: 305 mg/dL — ABNORMAL HIGH (ref 70–99)
Potassium: 3.6 mmol/L (ref 3.5–5.1)
Potassium: 3.8 mmol/L (ref 3.5–5.1)
Sodium: 126 mmol/L — ABNORMAL LOW (ref 135–145)
Sodium: 130 mmol/L — ABNORMAL LOW (ref 135–145)

## 2018-12-18 LAB — EXPECTORATED SPUTUM ASSESSMENT W GRAM STAIN, RFLX TO RESP C

## 2018-12-18 LAB — LACTIC ACID, PLASMA
Lactic Acid, Venous: 5 mmol/L (ref 0.5–1.9)
Lactic Acid, Venous: 2.8 mmol/L (ref 0.5–1.9)

## 2018-12-18 LAB — PROCALCITONIN: Procalcitonin: <0.1 ng/mL

## 2018-12-18 LAB — OSMOLALITY, URINE: Osmolality, Ur: 240 mOsm/kg — ABNORMAL LOW (ref 300–900)

## 2018-12-18 MED ORDER — GUAIFENESIN-DM 100-10 MG/5ML PO SYRP
5.0000 mL | ORAL_SOLUTION | ORAL | Status: DC | PRN
  Administered 2018-12-19: 5 mL via ORAL
  Filled 2018-12-18: qty 10

## 2018-12-18 MED ORDER — INSULIN ASPART 100 UNIT/ML ~~LOC~~ SOLN
0.0000 [IU] | Freq: Three times a day (TID) | SUBCUTANEOUS | Status: DC
  Administered 2018-12-18: 5 [IU] via SUBCUTANEOUS
  Administered 2018-12-19: 3 [IU] via SUBCUTANEOUS
  Administered 2018-12-19: 5 [IU] via SUBCUTANEOUS

## 2018-12-18 MED ORDER — GUAIFENESIN ER 600 MG PO TB12
1200.0000 mg | ORAL_TABLET | Freq: Two times a day (BID) | ORAL | Status: DC
  Administered 2018-12-18 – 2018-12-19 (×3): 1200 mg via ORAL
  Filled 2018-12-18 (×3): qty 2

## 2018-12-18 MED ORDER — INSULIN GLARGINE 100 UNIT/ML ~~LOC~~ SOLN
5.0000 [IU] | Freq: Once | SUBCUTANEOUS | Status: AC
  Administered 2018-12-18: 5 [IU] via SUBCUTANEOUS
  Filled 2018-12-18: qty 0.05

## 2018-12-18 MED ORDER — SODIUM CHLORIDE 0.9 % IV SOLN
INTRAVENOUS | Status: AC
  Administered 2018-12-18 – 2018-12-19 (×2): via INTRAVENOUS

## 2018-12-18 MED ORDER — INSULIN ASPART 100 UNIT/ML ~~LOC~~ SOLN
0.0000 [IU] | Freq: Every day | SUBCUTANEOUS | Status: DC
  Administered 2018-12-18: 3 [IU] via SUBCUTANEOUS

## 2018-12-18 MED ORDER — METHYLPREDNISOLONE SODIUM SUCC 40 MG IJ SOLR
40.0000 mg | Freq: Two times a day (BID) | INTRAMUSCULAR | Status: DC
  Administered 2018-12-18 – 2018-12-19 (×2): 40 mg via INTRAVENOUS
  Filled 2018-12-18 (×2): qty 1

## 2018-12-18 NOTE — Plan of Care (Signed)
  Problem: Clinical Measurements: Goal: Respiratory complications will improve Outcome: Progressing Goal: Cardiovascular complication will be avoided Outcome: Progressing   Problem: Activity: Goal: Risk for activity intolerance will decrease Outcome: Progressing   Problem: Coping: Goal: Level of anxiety will decrease Outcome: Progressing   Problem: Safety: Goal: Ability to remain free from injury will improve Outcome: Progressing

## 2018-12-18 NOTE — Progress Notes (Signed)
PROGRESS NOTE   Charles Lloyd.  [id number]    DOB: 1940/08/10    DOA: 12/17/2018  PCP: Isaac Bliss, [name], MD   I have briefly reviewed patients previous medical records in [facility].  Chief Complaint  Patient presents with  . Cough    Brief Narrative:  78 year old male with PMH of HTN, HLD, DM 2, chronic A. fib on Eliquis awaiting ablation, chronic diastolic CHF, presented to [facility] ED due to progressively worsening productive cough, dyspnea and wheezing.  His symptoms started 5 days PTA, seen by PCP with telemedicine visit on 10/19, prescribed Augmentin and cough medicines but symptoms worsened.  Associated with some diarrhea, poor appetite and weight loss.  Admitted for new onset acute respiratory failure with hypoxia due to reactive airway disease from suspected acute viral bronchitis.  Slowly improving.   Assessment & Plan:   Principal Problem:   Acute respiratory failure with hypoxia (HCC) Active Problems:   Persistent atrial fibrillation (HCC)   Chronic diastolic CHF (congestive heart failure) ([place])   Type 2 diabetes mellitus with diabetic polyneuropathy, without long-term current use of insulin (HCC)   Hyponatremia   Community acquired pneumonia   Acute respiratory failure with hypoxia/reactive airway disease  Not on home oxygen prior to admission.  Oxygen saturations in the mid to upper 80s on room air in ED.  Chest x-ray without acute cardiopulmonary findings.  Urinary pneumococcal antigen negative.  BNP: 249.  SARS coronavirus 2 testing negative.  Blood cultures x2: Negative to date.  Sputum culture pending.  Patient denies history of COPD or asthma but was previously using Breo.  Lifelong non-smoker.  Procalcitonin x2: Negative.  However given clinical picture including productive sputum, continue empirically started IV ceftriaxone and azithromycin.  Continue bronchodilator nebulizations, added Mucinex, aggressive use of  incentive spirometry, wean off oxygen as tolerated when reassess home oxygen requirement in a.m. prior to discharge.  Given clinical improvement and marked hyperglycemia, reduced Solu-Medrol from 60 mg every 6 hourly to 40 mg every 12 hourly.  Elevated lactate  Lactate peaked to 6.1.  Suspect multifactorial due to acute hypoxia, tachypnea, dehydration and Metformin use.  Metformin held.  Gently hydrated with IV fluids.  Follow serum lactate.  Hyponatremia  Serum sodium 124 on admission, down from 142 in September 2020.  Multifactorial due to dehydration from poor oral intake, transient loose stools  Urine osmolarity 240, urine sodium <10  Still appears volume depleted, continue gentle IV saline hydration overnight.  Although serum sodium today is 126, corrected to hyperglycemia this works out to [employer].  Temporarily hold Lasix.  Chronic diastolic CHF  Clinically appears dehydrated.  Hold Lasix temporarily.  Chronic atrial fibrillation  CHADS-VASc is 11 (age x2, CHF, DM, HTN)  Continue Eliquis, diltiazem and metoprolol.  Reports that he is supposed to have ablation with Dr. Curt Bears, [facility].  Controlled ventricular rate.  DM2 with hyperglycemia  A1c 6.3 in September 2020  Metformin held on admission due to elevated lactate.    Reduced IV Solu-Medrol as noted above.  Change SSI to moderate sensitivity and will give a dose of Lantus 5 units x 1.  Monitor closely and adjust insulins as needed.  Essential hypertension  Controlled on lisinopril, diltiazem and metoprolol.  Hyperlipidemia  Continue statins.  Anemia  May be dilutional due to IV fluids.  No bleeding reported.  Follow CBC in a.m.   DVT prophylaxis: Continue full dose Eliquis Code Status: Full Family Communication: None at bedside  Disposition: DC home pending clinical improvement, possibly 10/23.  Due to ongoing symptoms that are not consistently improving, hypoxia requiring oxygen,  elevated lactate, IV steroids for reactive airway disease, patient will need to stay at least an additional night for close monitoring evaluation and reassessment in a.m.  Thereby changed from observation to inpatient status.   Consultants:  None  Procedures:  None  Antimicrobials:  IV ceftriaxone and azithromycin.   Subjective: Patient reports that overnight she felt better with significant improvement in dyspnea cough and congestion.  However was unable to sleep well last night due to ongoing disturbance from nursing care.  Early this morning however had recurrence of some chest congestion, cough with difficulty to bring up sputum, mild dyspnea which is better than on admission but breathing not yet at baseline.  No diarrhea reported.  No chest pain.  Objective:  Vitals:   12/18/18 0500 12/18/18 0540 12/18/18 0816 12/18/18 1135  BP:  (!) 144/68 131/82   Pulse:  91 89   Resp:  16    Temp:  97.7 F (36.5 C)    TempSrc:  Oral    SpO2:  96%  96%  Weight: 86.5 kg     Height:        Examination:  General exam: Pleasant elderly male, moderately built and nourished lying comfortably propped up in bed without distress.  Oral mucosa with borderline hydration. Respiratory system: Slightly harsh breath sounds bilaterally with scattered occasional rhonchi anteriorly.  No crackles.  No increased work of breathing. Cardiovascular system: S1 & S2 heard, RRR. No JVD, murmurs, rubs, gallops or clicks. No pedal edema.  Telemetry personally reviewed: A. fib with controlled ventricular rate. Gastrointestinal system: Abdomen is nondistended, soft and nontender. No organomegaly or masses felt. Normal bowel sounds heard. Central nervous system: Alert and oriented. No focal neurological deficits. Extremities: Symmetric 5 x 5 power. Skin: No rashes, lesions or ulcers Psychiatry: Judgement and insight appear normal. Mood & affect appropriate.     Data Reviewed: I have personally reviewed following  labs and imaging studies  CBC: Recent Labs  Lab 12/17/18 1151 12/18/18 0035  WBC 8.4 3.4*  NEUTROABS 4.3 3.1  HGB 12.4* 10.3*  HCT 36.7* 30.3*  MCV 85.0 86.3  PLT 218 0000000   Basic Metabolic Panel: Recent Labs  Lab 12/17/18 1151 12/17/18 2129 12/18/18 0035  NA 124* 125* 126*  K 3.5 3.5 3.8  CL 86* 89* 94*  CO2 25 20* 20*  GLUCOSE 111* 333* 305*  BUN 11 11 11   CREATININE 1.00 1.09 1.06  CALCIUM 9.8 8.7* 8.4*   Liver Function Tests: Recent Labs  Lab 12/17/18 1151  AST 33  ALT 23  ALKPHOS 90  BILITOT 1.1  PROT 7.1  ALBUMIN 4.7    Cardiac Enzymes: No results for input(s): CKTOTAL, CKMB, CKMBINDEX, TROPONINI in the last 168 hours.  CBG: Recent Labs  Lab 12/17/18 2231 12/18/18 0305 12/18/18 0723 12/18/18 1115  GLUCAP 318* 183* 214* 259*    Recent Results (from the past 240 hour(s))  Blood culture (routine x 2)     Status: None (Preliminary result)   Collection Time: 12/17/18 11:53 AM   Specimen: BLOOD  Result Value Ref Range Status   Specimen Description   Final    BLOOD LEFT ANTECUBITAL Performed at [facility], [address] [place], [place] [address]    Special Requests   Final    BOTTLES DRAWN AEROBIC AND ANAEROBIC Blood Culture adequate volume Performed at  [facility], [address] [place], [place] [address]    Culture   Final    NO GROWTH < 24 HOURS Performed at [facility], [place] [address] [place], [place] [address]    Report Status PENDING  Incomplete  SARS Coronavirus 2 by RT PCR (hospital order, performed in [facility] hospital lab) Nasopharyngeal Nasopharyngeal Swab     Status: None   Collection Time: 12/17/18 12:24 PM   Specimen: Nasopharyngeal Swab  Result Value Ref Range Status   SARS Coronavirus 2 NEGATIVE NEGATIVE Final    Comment: (NOTE) If result is NEGATIVE SARS-CoV-2 target nucleic acids are NOT DETECTED. The SARS-CoV-2 RNA is generally detectable in upper and lower  respiratory specimens  during the acute phase of infection. The lowest  concentration of SARS-CoV-2 viral copies this assay can detect is 250  copies / mL. A negative result does not preclude SARS-CoV-2 infection  and should not be used as the sole basis for treatment or other  patient management decisions.  A negative result may occur with  improper specimen collection / handling, submission of specimen other  than nasopharyngeal swab, presence of viral mutation(s) within the  areas targeted by this assay, and inadequate number of viral copies  (<250 copies / mL). A negative result must be combined with clinical  observations, patient history, and epidemiological information. If result is POSITIVE SARS-CoV-2 target nucleic acids are DETECTED. The SARS-CoV-2 RNA is generally detectable in upper and lower  respiratory specimens dur ing the acute phase of infection.  Positive  results are indicative of active infection with SARS-CoV-2.  Clinical  correlation with patient history and other diagnostic information is  necessary to determine patient infection status.  Positive results do  not rule out bacterial infection or co-infection with other viruses. If result is PRESUMPTIVE POSTIVE SARS-CoV-2 nucleic acids MAY BE PRESENT.   A presumptive positive result was obtained on the submitted specimen  and confirmed on repeat testing.  While 2019 novel coronavirus  (SARS-CoV-2) nucleic acids may be present in the submitted sample  additional confirmatory testing may be necessary for epidemiological  and / or clinical management purposes  to differentiate between  SARS-CoV-2 and other Sarbecovirus currently known to infect humans.  If clinically indicated additional testing with an alternate test  methodology [phone]) is advised. The SARS-CoV-2 RNA is generally  detectable in upper and lower respiratory sp ecimens during the acute  phase of infection. The expected result is Negative. Fact Sheet for Patients:   StrictlyIdeas.no Fact Sheet for Healthcare Providers: BankingDealers.co.za This test is not yet approved or cleared by the [place] FDA and has been authorized for detection and/or diagnosis of SARS-CoV-2 by FDA under an Emergency Use Authorization (EUA).  This EUA will remain in effect (meaning this test can be used) for the duration of the COVID-19 declaration under Section 564(b)(1) of the Act, 21 U.S.C. section 360bbb-3(b)(1), unless the authorization is terminated or revoked sooner. Performed at [facility], [address] [place], [place] [address]   Blood culture (routine x 2)     Status: None (Preliminary result)   Collection Time: 12/17/18 12:25 PM   Specimen: BLOOD  Result Value Ref Range Status   Specimen Description   Final    BLOOD BLOOD LEFT FOREARM Performed at [facility], [address] [place], [place] [address]    Special Requests   Final    BOTTLES DRAWN AEROBIC AND ANAEROBIC  Blood Culture results may not be optimal due to an inadequate volume of blood received in culture bottles Performed at [facility], [address] [place], [place] [address]    Culture   Final    NO GROWTH < 24 HOURS Performed at [facility], [place] [address] [place], [place] [address]    Report Status PENDING  Incomplete  Culture, sputum-assessment     Status: None   Collection Time: 12/18/18  8:34 AM   Specimen: Sputum  Result Value Ref Range Status   Specimen Description SPUTUM  Final   Special Requests NONE  Final   Sputum evaluation   Final    THIS SPECIMEN IS ACCEPTABLE FOR SPUTUM CULTURE Performed at [facility], [place] [address] [place], [place] [address]    Report Status 12/18/2018 FINAL  Final  Culture, respiratory     Status: None (Preliminary result)   Collection Time: 12/18/18  8:34 AM   Specimen: SPU  Result Value Ref Range Status   Specimen Description    Final    SPUTUM Performed at [facility] [address], [place], [place] [address]    Special Requests   Final    NONE Reflexed from [id number] Performed at [facility], [place] [address], [place], [place] [address]    Gram Stain   Final    NO WBC SEEN RARE GRAM POSITIVE COCCI IN PAIRS RARE GRAM POSITIVE RODS Performed at [facility], [place] [address] [place], [place] [address]    Culture PENDING  Incomplete   Report Status PENDING  Incomplete         Radiology Studies: Dg Chest Portable 1 View  Result Date: 12/17/2018 CLINICAL DATA:  Productive cough and shortness of breath for 1 week. EXAM: PORTABLE CHEST 1 VIEW COMPARISON:  09/22/2018 FINDINGS: The heart is upper limits of normal in size given the AP projection and portable technique. Normal appearance of the pulmonary hila. The lungs are clear. No infiltrates, edema or effusions. No pulmonary lesions. The bony thorax is intact. IMPRESSION: No acute cardiopulmonary findings. Electronically Signed   By: Marijo Sanes M.D.   On: 12/17/2018 12:39        Scheduled Meds: . apixaban  5 mg Oral BID  . atorvastatin  20 mg Oral q1800  . diltiazem  360 mg Oral Daily  . gabapentin  600 mg Oral QHS  . insulin aspart  0-5 Units Subcutaneous QHS  . insulin aspart  0-9 Units Subcutaneous TID WC  . lisinopril  10 mg Oral QPM  . methylPREDNISolone (SOLU-MEDROL) injection  60 mg Intravenous Q6H  . metoprolol tartrate  100 mg Oral BID  . montelukast  10 mg Oral QHS  . sodium chloride flush  3 mL Intravenous Q12H   Continuous Infusions: . azithromycin    . cefTRIAXone (ROCEPHIN)  IV       LOS: 0 days     Vernell Leep, MD, FACP, [facility]. [facility]  To contact the attending provider between 7A-7P or the covering provider during after hours 7P-7A, please log into the web site [email or web address] and access using universal [place] password for that web site. If you do not have the  password, please call the hospital operator.  12/18/2018, 1:29 PM

## 2018-12-19 ENCOUNTER — Inpatient Hospital Stay (HOSPITAL_COMMUNITY): Payer: Medicare Other

## 2018-12-19 DIAGNOSIS — E1142 Type 2 diabetes mellitus with diabetic polyneuropathy: Secondary | ICD-10-CM

## 2018-12-19 LAB — CBC
HCT: 30.2 % — ABNORMAL LOW (ref 39.0–52.0)
Hemoglobin: 10.1 g/dL — ABNORMAL LOW (ref 13.0–17.0)
MCH: 29.4 pg (ref 26.0–34.0)
MCHC: 33.4 g/dL (ref 30.0–36.0)
MCV: 88 fL (ref 80.0–100.0)
Platelets: 169 10*3/uL (ref 150–400)
RBC: 3.43 MIL/uL — ABNORMAL LOW (ref 4.22–5.81)
RDW: 16.6 % — ABNORMAL HIGH (ref 11.5–15.5)
WBC: 10.7 10*3/uL — ABNORMAL HIGH (ref 4.0–10.5)
nRBC: 0 % (ref 0.0–0.2)

## 2018-12-19 LAB — BASIC METABOLIC PANEL
Anion gap: 10 (ref 5–15)
BUN: 19 mg/dL (ref 8–23)
CO2: 22 mmol/L (ref 22–32)
Calcium: 8.6 mg/dL — ABNORMAL LOW (ref 8.9–10.3)
Chloride: 98 mmol/L (ref 98–111)
Creatinine, Ser: 0.99 mg/dL (ref 0.61–1.24)
GFR calc Af Amer: >60 mL/min (ref >60)
GFR calc non Af Amer: >60 mL/min (ref >60)
Glucose, Bld: 220 mg/dL — ABNORMAL HIGH (ref 70–99)
Potassium: 3.6 mmol/L (ref 3.5–5.1)
Sodium: 130 mmol/L — ABNORMAL LOW (ref 135–145)

## 2018-12-19 LAB — PROCALCITONIN: Procalcitonin: <0.1 ng/mL

## 2018-12-19 LAB — GLUCOSE, CAPILLARY
Glucose-Capillary: 210 mg/dL — ABNORMAL HIGH (ref 70–99)
Glucose-Capillary: 167 mg/dL — ABNORMAL HIGH (ref 70–99)

## 2018-12-19 IMAGING — DX DG CHEST 2V
2 series · 2 of 2 positions shown · non-contrast
Comparison: [DATE]

CLINICAL DATA: Cough

EXAM:
CHEST - 2 VIEW

[chest pa]
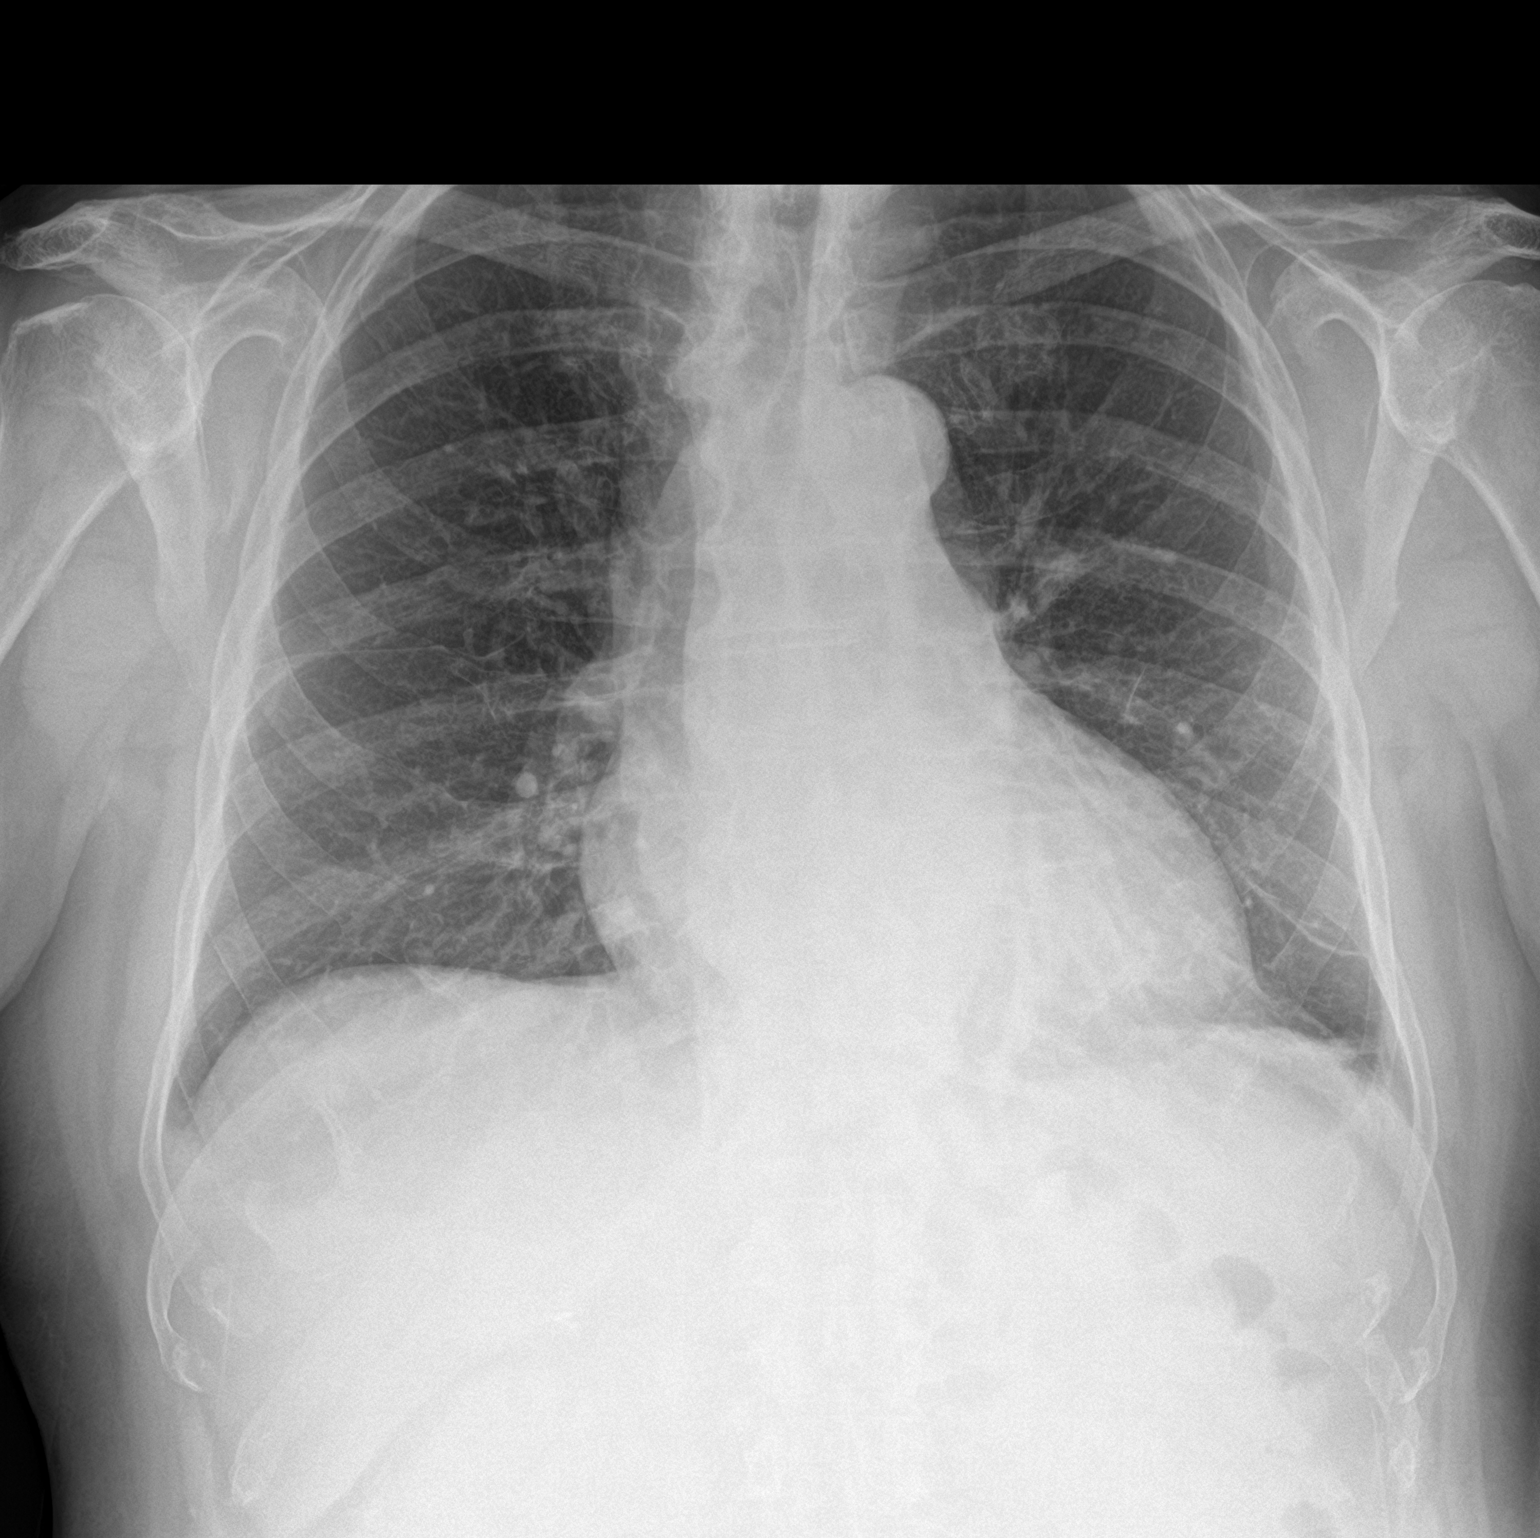

[chest lat]
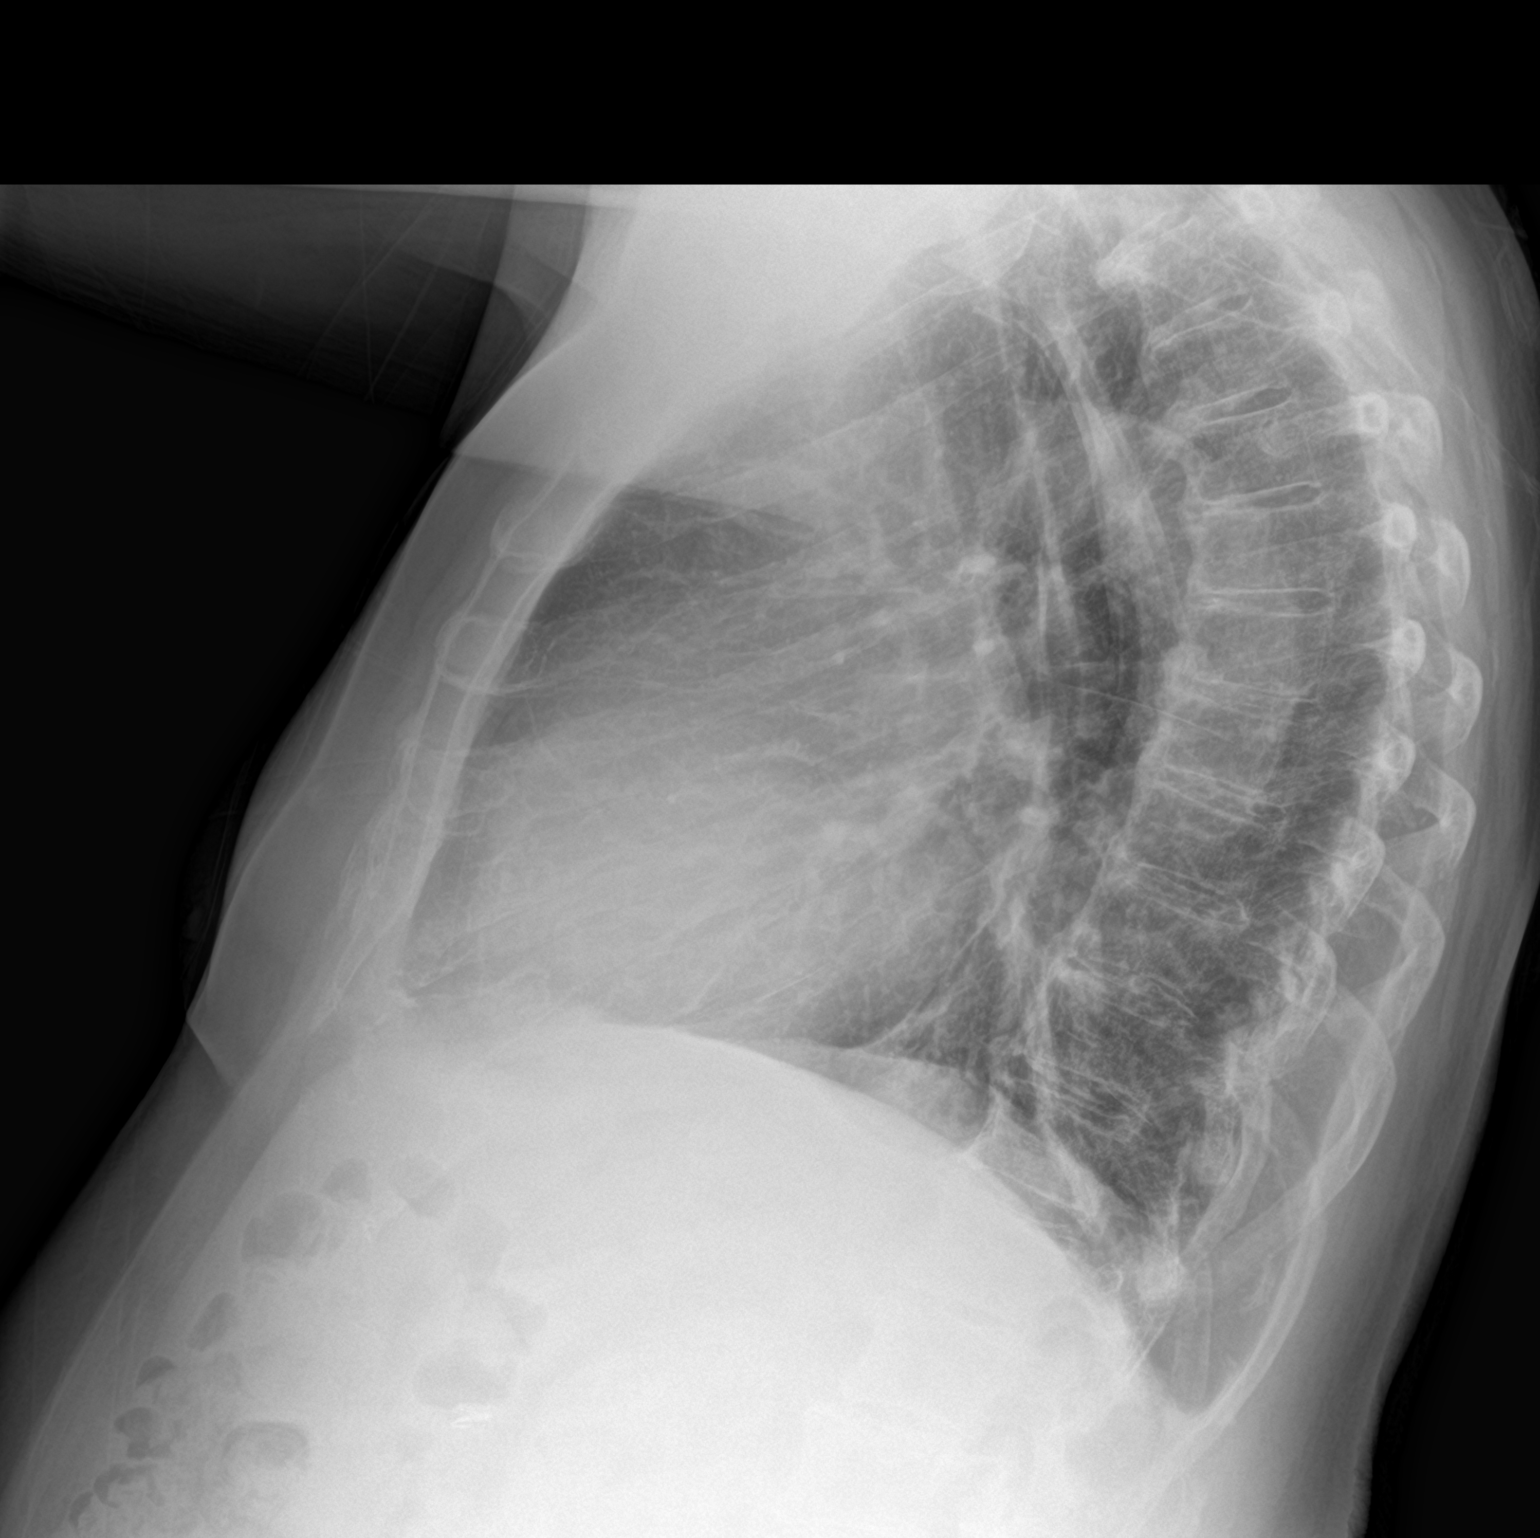

[2 of 2 positions shown; findings below may reference images not displayed]

FINDINGS: Cardiomegaly again noted.

Mild LEFT LOWER lobe atelectasis is present.

There is no evidence of focal airspace disease, pulmonary edema,
suspicious pulmonary nodule/mass, pleural effusion, or pneumothorax.

No acute bony abnormalities are identified.
IMPRESSION: 1. Mild LEFT LOWER lobe atelectasis.
2. Cardiomegaly.

## 2018-12-19 MED ORDER — ALBUTEROL SULFATE (2.5 MG/3ML) 0.083% IN NEBU: 2.5000 mg | INHALATION_SOLUTION | Freq: Four times a day (QID) | RESPIRATORY_TRACT | 0 refills | Status: DC | PRN

## 2018-12-19 MED ORDER — PREDNISONE 10 MG PO TABS: ORAL_TABLET | ORAL | 0 refills | Status: DC

## 2018-12-19 MED ORDER — GUAIFENESIN-DM 100-10 MG/5ML PO SYRP: 5.0000 mL | ORAL_SOLUTION | ORAL | 0 refills | Status: DC | PRN

## 2018-12-19 MED ORDER — AZITHROMYCIN 250 MG PO TABS
500.0000 mg | ORAL_TABLET | Freq: Every day | ORAL | Status: DC
  Administered 2018-12-19: 500 mg via ORAL
  Filled 2018-12-19: qty 2

## 2018-12-19 MED ORDER — GUAIFENESIN ER 600 MG PO TB12: 1200.0000 mg | ORAL_TABLET | Freq: Two times a day (BID) | ORAL | 0 refills | Status: AC

## 2018-12-19 NOTE — Discharge Summary (Signed)
Physician Discharge Summary  Charles Lloyd. [id number] DOB: 1941/01/08  PCP: Isaac Bliss, [name], MD  Admitted from: Home Discharged to: Home  Admit date: 12/17/2018 Discharge date: 12/19/2018  Recommendations for Outpatient Follow-up:   Follow-up Information    Isaac Bliss, [name], MD. Schedule an appointment as soon as possible for a visit in 1 week(s).   Specialty: Internal Medicine Why: To be seen with repeat labs (CBC & BMP).  Recommend repeating chest x-ray in about 4 weeks time. Contact information: [place] [address] [phone]            Home Health: None Equipment/Devices: Oxygen via nasal cannula 2 L/min continuously.  Discharge Condition: Improved and stable CODE STATUS: Full Diet recommendation: Heart healthy & diabetic diet.  Discharge Diagnoses:  Principal Problem:   Acute respiratory failure with hypoxia (HCC) Active Problems:   Persistent atrial fibrillation (HCC)   Chronic diastolic CHF (congestive heart failure) (HCC)   Type 2 diabetes mellitus with diabetic polyneuropathy, without long-term current use of insulin (HCC)   Hyponatremia   Community acquired pneumonia   Reactive airway disease with acute exacerbation   Lactic acidosis   Brief Summary: 78 year old male and is independent, with PMH of HTN, HLD, DM 2, chronic A. fib on Eliquis awaiting ablation, chronic diastolic CHF, presented to [facility] ED due to progressively worsening productive cough, dyspnea and wheezing.  His symptoms started 5 days PTA, seen by PCP with telemedicine visit on 10/19, prescribed Augmentin and cough medicines but symptoms worsened.  Associated with some diarrhea, poor appetite and weight loss.  Admitted for new onset acute respiratory failure with hypoxia due to reactive airway disease from suspected acute viral bronchitis.     Assessment & Plan:  Acute respiratory failure with hypoxia/reactive  airway disease  Not on home oxygen prior to admission.  Oxygen saturations in the mid to upper 80s on room air in ED.  Chest x-ray without acute cardiopulmonary findings.  Urinary pneumococcal antigen negative.  BNP: 249.  SARS coronavirus 2 testing negative.  Blood cultures x2: Negative to date.  Sputum culture: Reincubated for better growth.  Patient denies history of COPD or asthma but was previously using Breo.  Lifelong non-smoker.  Procalcitonin x2: Negative.  However given clinical picture including productive sputum, continued empirically started IV ceftriaxone and azithromycin.  He has completed 3 days of antibiotics in the hospital and a couple days prior to discharge.  No further antibiotics at discharge.  Continue bronchodilator nebulizations, added Mucinex, aggressive use of incentive spirometry.  Given clinical improvement and marked hyperglycemia, reduced Solu-Medrol from 60 mg every 6 hourly to 40 mg every 12 hourly.  Had a brief mild spell of cough last night, unable to bring up thick tenacious sputum, some dyspnea, improved after Robitussin and nebulizations.  No complaints this morning.  Ambulated with RN and still mildly hypoxic requiring oxygen on ambulation.  Repeated chest x-ray and shows mild left lower lobe atelectasis.  Arranged home oxygen.  Patient states that his son works for [employer] who has already arranged for an oxygen concentrator and nebulizer machine.  At his request, provided albuterol nebulizing solution for as needed nebs.  Continue Mucinex, as needed Robitussin DM and a brief tapering course of oral prednisone.  Close outpatient follow-up with PCP and consider repeating chest x-ray in 1 month to ensure resolution of abnormal findings.  Elevated lactate  Lactate peaked to 6.1.  Suspect multifactorial due to acute hypoxia, tachypnea,  dehydration and Metformin use.  Metformin held.  Gently hydrated with IV fluids.  Improved  Hyponatremia  Serum  sodium 124 on admission, down from 142 in September 2020.  Multifactorial due to dehydration from poor oral intake, transient loose stools  Urine osmolarity 240, urine sodium <10  Treated with gentle IV fluid hydration.  Uncorrected serum sodium 130 which should be higher if corrected for mild hyperglycemia.  Improved.  Chronic diastolic CHF  Lasix was temporarily held in the hospital due to clinical dehydration.  Prior home dose of Lasix to be resumed at discharge.  Chronic atrial fibrillation  CHADS-VASc is 9 (age x2, CHF, DM, HTN)  Continue Eliquis, diltiazem and metoprolol.  Reports that he is supposed to have ablation with Dr. Curt Bears, [facility].  Controlled ventricular rate.  DM2 with hyperglycemia  A1c 6.3 in September 2020  Metformin held on admission due to elevated lactate.    Solu-Medrol dose was reduced.  Treated with SSI and a dose of Lantus 5 units x 1.  Glycemic control is slowly improving and should further improve as steroids are tapered to discontinue  Resume prior home dose of Metformin at discharge.  Essential hypertension  Controlled on lisinopril, diltiazem and metoprolol.  Hyperlipidemia  Continue statins.  Anemia  May be dilutional due to IV fluids.  No bleeding reported.    Hemoglobin stable in the 10 g range.  Consultants:  None  Procedures None  Discharge Instructions  Discharge Instructions    (HEART FAILURE PATIENTS) Call MD:  Anytime you have any of the following symptoms: 1) 3 pound weight gain in 24 hours or 5 pounds in 1 week 2) shortness of breath, with or without a dry hacking cough 3) swelling in the hands, feet or stomach 4) if you have to sleep on extra pillows at night in order to breathe.   Complete by: As directed    Call MD for:  difficulty breathing, headache or visual disturbances   Complete by: As directed    Call MD for:  extreme fatigue   Complete by: As directed    Call MD for:  persistant  dizziness or light-headedness   Complete by: As directed    Call MD for:  persistant nausea and vomiting   Complete by: As directed    Call MD for:  severe uncontrolled pain   Complete by: As directed    Call MD for:  temperature >100.4   Complete by: As directed    Diet - low sodium heart healthy   Complete by: As directed    Diet Carb Modified   Complete by: As directed    Increase activity slowly   Complete by: As directed        Medication List    STOP taking these medications   amoxicillin-clavulanate 875-125 MG tablet Commonly known as: AUGMENTIN   benzonatate 100 MG capsule Commonly known as: TESSALON     TAKE these medications   albuterol (2.5 MG/3ML) 0.083% nebulizer solution Commonly known as: PROVENTIL Take 3 mLs (2.5 mg total) by nebulization every 6 (six) hours as needed for wheezing or shortness of breath.   atorvastatin 20 MG tablet Commonly known as: LIPITOR TAKE 1 TABLET DAILY AT 6 P.M. What changed: See the new instructions.   diltiazem 360 MG 24 hr capsule Commonly known as: CARDIZEM CD TAKE 1 CAPSULE DAILY   Eliquis 5 MG Tabs tablet Generic drug: apixaban TAKE 1 TABLET TWICE A DAY   furosemide 40 MG tablet Commonly  known as: LASIX Take 1 tablet (40 mg total) by mouth 2 (two) times daily.   gabapentin 300 MG capsule Commonly known as: NEURONTIN Take 2 capsules (600 mg total) by mouth at bedtime.   glucose blood test strip Use to test blood sugars once daily as directed Dx:E11.65   glucose blood test strip Commonly known as: KROGER TEST STRIPS Test 2x dailyDx: type 2 diabetes, uncontrolledfluctuating blood sugars   guaiFENesin 600 MG 12 hr tablet Commonly known as: MUCINEX Take 2 tablets (1,200 mg total) by mouth 2 (two) times daily for 7 days.   guaiFENesin-dextromethorphan 100-10 MG/5ML syrup Commonly known as: ROBITUSSIN DM Take 5 mLs by mouth every 4 (four) hours as needed for cough.   lisinopril 10 MG tablet Commonly known  as: ZESTRIL Take 1 tablet (10 mg total) by mouth every evening.   metFORMIN 500 MG tablet Commonly known as: GLUCOPHAGE Take 1 tablet (500 mg total) by mouth 2 (two) times daily with a meal.   metoprolol tartrate 50 MG tablet Commonly known as: LOPRESSOR Take 100 mg by mouth 2 (two) times daily.   mometasone 50 MCG/ACT nasal spray Commonly known as: NASONEX Place 2 sprays into the nose 2 (two) times daily.   montelukast 10 MG tablet Commonly known as: SINGULAIR Take 10 mg by mouth at bedtime.   potassium chloride SA 20 MEQ tablet Commonly known as: KLOR-CON Take 1 tablet (20 mEq total) by mouth daily.   predniSONE 10 MG tablet Commonly known as: DELTASONE Take 3 tabs daily for 2 days, then 2 tabs daily for 2 days, then 1 tab daily for 2 days, then stop.      No Known Allergies    Procedures/Studies: Dg Chest 2 View  Result Date: 12/19/2018 CLINICAL DATA:  Cough EXAM: CHEST - 2 VIEW COMPARISON:  12/17/2018 FINDINGS: Cardiomegaly again noted. Mild LEFT LOWER lobe atelectasis is present. There is no evidence of focal airspace disease, pulmonary edema, suspicious pulmonary nodule/mass, pleural effusion, or pneumothorax. No acute bony abnormalities are identified. IMPRESSION: 1. Mild LEFT LOWER lobe atelectasis. 2. Cardiomegaly. Electronically Signed   By: Margarette Canada M.D.   On: 12/19/2018 11:56   Dg Chest Portable 1 View  Result Date: 12/17/2018 CLINICAL DATA:  Productive cough and shortness of breath for 1 week. EXAM: PORTABLE CHEST 1 VIEW COMPARISON:  09/22/2018 FINDINGS: The heart is upper limits of normal in size given the AP projection and portable technique. Normal appearance of the pulmonary hila. The lungs are clear. No infiltrates, edema or effusions. No pulmonary lesions. The bony thorax is intact. IMPRESSION: No acute cardiopulmonary findings. Electronically Signed   By: Marijo Sanes M.D.   On: 12/17/2018 12:39      Subjective: Overall patient feels much  better.  Denies complaints today.  No chest pain, dyspnea or cough reported.  Indicated that he had a spell last night when after nebulizing treatment had cough, was unable to bring up the thick sputum, had to be nebulized again and then improved further after Robitussin.  Able to sleep for 6 hours last night.  Requesting albuterol nebulizer at discharge.  Discharge Exam:  Vitals:   12/19/18 0500 12/19/18 0524 12/19/18 1248 12/19/18 1318  BP:  136/90  116/61  Pulse:  85  62  Resp:  20  18  Temp:  (!) 97.5 F (36.4 C)  97.7 F (36.5 C)  TempSrc:  Oral  Oral  SpO2:  94% 98% 97%  Weight: 87.8 kg     Height:  General exam: Pleasant elderly male, moderately built and nourished sitting up comfortably in chair without distress.  Oral mucosa moist Respiratory system:  Clear to auscultation without wheezing, rhonchi or crackles.  No increased work of breathing. Cardiovascular system: S1 & S2 heard, RRR. No JVD, murmurs, rubs, gallops or clicks.  Trace ankle edema.  Gastrointestinal system: Abdomen is nondistended, soft and nontender. No organomegaly or masses felt. Normal bowel sounds heard. Central nervous system: Alert and oriented. No focal neurological deficits. Extremities: Symmetric 5 x 5 power. Skin: No rashes, lesions or ulcers Psychiatry: Judgement and insight appear normal. Mood & affect appropriate.     The results of significant diagnostics from this hospitalization (including imaging, microbiology, ancillary and laboratory) are listed below for reference.     Microbiology: Recent Results (from the past 240 hour(s))  Blood culture (routine x 2)     Status: None (Preliminary result)   Collection Time: 12/17/18 11:53 AM   Specimen: BLOOD  Result Value Ref Range Status   Specimen Description   Final    BLOOD LEFT ANTECUBITAL Performed at [facility], [address] [place], [place] [address]    Special Requests   Final    BOTTLES DRAWN AEROBIC AND  ANAEROBIC Blood Culture adequate volume Performed at [facility], [address] [place], [place] [address]    Culture   Final    NO GROWTH 2 DAYS Performed at [facility], [place] [address] [place], [place] [address]    Report Status PENDING  Incomplete  SARS Coronavirus 2 by RT PCR (hospital order, performed in [facility] hospital lab) Nasopharyngeal Nasopharyngeal Swab     Status: None   Collection Time: 12/17/18 12:24 PM   Specimen: Nasopharyngeal Swab  Result Value Ref Range Status   SARS Coronavirus 2 NEGATIVE NEGATIVE Final    Comment: (NOTE) If result is NEGATIVE SARS-CoV-2 target nucleic acids are NOT DETECTED. The SARS-CoV-2 RNA is generally detectable in upper and lower  respiratory specimens during the acute phase of infection. The lowest  concentration of SARS-CoV-2 viral copies this assay can detect is 250  copies / mL. A negative result does not preclude SARS-CoV-2 infection  and should not be used as the sole basis for treatment or other  patient management decisions.  A negative result may occur with  improper specimen collection / handling, submission of specimen other  than nasopharyngeal swab, presence of viral mutation(s) within the  areas targeted by this assay, and inadequate number of viral copies  (<250 copies / mL). A negative result must be combined with clinical  observations, patient history, and epidemiological information. If result is POSITIVE SARS-CoV-2 target nucleic acids are DETECTED. The SARS-CoV-2 RNA is generally detectable in upper and lower  respiratory specimens dur ing the acute phase of infection.  Positive  results are indicative of active infection with SARS-CoV-2.  Clinical  correlation with patient history and other diagnostic information is  necessary to determine patient infection status.  Positive results do  not rule out bacterial infection or co-infection with other viruses. If result is PRESUMPTIVE  POSTIVE SARS-CoV-2 nucleic acids MAY BE PRESENT.   A presumptive positive result was obtained on the submitted specimen  and confirmed on repeat testing.  While 2019 novel coronavirus  (SARS-CoV-2) nucleic acids may be present in the submitted sample  additional confirmatory testing may be necessary for epidemiological  and / or clinical management purposes  to differentiate between  SARS-CoV-2 and  other Sarbecovirus currently known to infect humans.  If clinically indicated additional testing with an alternate test  methodology [phone]) is advised. The SARS-CoV-2 RNA is generally  detectable in upper and lower respiratory sp ecimens during the acute  phase of infection. The expected result is Negative. Fact Sheet for Patients:  StrictlyIdeas.no Fact Sheet for Healthcare Providers: BankingDealers.co.za This test is not yet approved or cleared by the [place] FDA and has been authorized for detection and/or diagnosis of SARS-CoV-2 by FDA under an Emergency Use Authorization (EUA).  This EUA will remain in effect (meaning this test can be used) for the duration of the COVID-19 declaration under Section 564(b)(1) of the Act, 21 U.S.C. section 360bbb-3(b)(1), unless the authorization is terminated or revoked sooner. Performed at [facility], [address] [place], [place] [address]   Blood culture (routine x 2)     Status: None (Preliminary result)   Collection Time: 12/17/18 12:25 PM   Specimen: BLOOD  Result Value Ref Range Status   Specimen Description   Final    BLOOD BLOOD LEFT FOREARM Performed at [facility], [address] [place], [place] [address]    Special Requests   Final    BOTTLES DRAWN AEROBIC AND ANAEROBIC Blood Culture results may not be optimal due to an inadequate volume of blood received in culture bottles Performed at [facility], [address] [place], [place]  [address]    Culture   Final    NO GROWTH 2 DAYS Performed at [facility], [place] [address], [place], [place] [address]    Report Status PENDING  Incomplete  Culture, sputum-assessment     Status: None   Collection Time: 12/18/18  8:34 AM   Specimen: Sputum  Result Value Ref Range Status   Specimen Description SPUTUM  Final   Special Requests NONE  Final   Sputum evaluation   Final    THIS SPECIMEN IS ACCEPTABLE FOR SPUTUM CULTURE Performed at [facility], [place] [address], [place], [place] [address]    Report Status 12/18/2018 FINAL  Final  Culture, respiratory     Status: None (Preliminary result)   Collection Time: 12/18/18  8:34 AM   Specimen: SPU  Result Value Ref Range Status   Specimen Description   Final    SPUTUM Performed at [place] [address] [place], [place] [address]    Special Requests   Final    NONE Reflexed from [id number] Performed at [facility], [place] [name], [place], [place] [address]    Gram Stain   Final    NO WBC SEEN RARE GRAM POSITIVE COCCI IN PAIRS RARE GRAM POSITIVE RODS    Culture   Final    CULTURE REINCUBATED FOR BETTER GROWTH Performed at [facility], [place] [address], [place],  [address]    Report Status PENDING  Incomplete     Labs: CBC: Recent Labs  Lab 12/17/18 1151 12/18/18 0035 12/19/18 0530  WBC 8.4 3.4* 10.7*  NEUTROABS 4.3 3.1  --   HGB 12.4* 10.3* 10.1*  HCT 36.7* 30.3* 30.2*  MCV 85.0 86.3 88.0  PLT 218 162 [id number]   Basic Metabolic Panel: Recent Labs  Lab 12/17/18 1151 12/17/18 2129 12/18/18 0035 12/18/18 1326 12/19/18 0530  NA 124* 125* 126* 130* 130*  K 3.5 3.5 3.8 3.6 3.6  CL 86* 89* 94* 96* 98  CO2 25 20*  20* 23 22  GLUCOSE 111* 333* 305* 241* 220*  BUN 11 11 11 13 19   CREATININE 1.00 1.09 1.06 1.01 0.99  CALCIUM 9.8 8.7* 8.4* 9.0 8.6*   Liver Function Tests: Recent Labs  Lab 12/17/18 1151  AST 33  ALT 23  ALKPHOS 90   BILITOT 1.1  PROT 7.1  ALBUMIN 4.7   BNP (last 3 results) Recent Labs    12/17/18 1151  BNP 249.4*   Cardiac Enzymes: No results for input(s): CKTOTAL, CKMB, CKMBINDEX, TROPONINI in the last 168 hours. CBG: Recent Labs  Lab 12/18/18 1115 12/18/18 1641 12/18/18 2006 12/19/18 0737 12/19/18 1148  GLUCAP 259* 213* 277* 210* 167*   Urinalysis    Component Value Date/Time   COLORURINE STRAW (A) 12/17/2018 2114   APPEARANCEUR CLEAR 12/17/2018 2114   LABSPEC 1.008 12/17/2018 2114   PHURINE 6.0 12/17/2018 2114   GLUCOSEU >=500 (A) 12/17/2018 2114   HGBUR NEGATIVE 12/17/2018 2114   BILIRUBINUR NEGATIVE 12/17/2018 2114   [place] 12/17/2018 2114   PROTEINUR NEGATIVE 12/17/2018 2114   NITRITE NEGATIVE 12/17/2018 2114   LEUKOCYTESUR NEGATIVE 12/17/2018 2114    I discussed in detail with patient's son, updated care and answered questions.  Time coordinating discharge: 40 minutes  SIGNED:  Vernell Leep, MD, FACP, [facility]. [facility]  To contact the attending provider between 7A-7P or the covering provider during after hours 7P-7A, please log into the web site [email or web address] and access using universal [place] password for that web site. If you do not have the password, please call the hospital operator.

## 2018-12-19 NOTE — Progress Notes (Signed)
Patient's oxygen saturation is 95 on 2L while resting. When the patient walks on 2L it stays at 94%-95%. When walking with no oxygen it dropped to 87%.

## 2018-12-19 NOTE — Discharge Instructions (Signed)

## 2018-12-19 NOTE — Progress Notes (Signed)
PHARMACIST - PHYSICIAN COMMUNICATION DR:   Algis Liming CONCERNING: Antibiotic IV to Oral Route Change Policy  RECOMMENDATION: This patient is receiving Azithromycin by the intravenous route.  Based on criteria approved by the Pharmacy and Therapeutics Committee, the antibiotic(s) is/are being converted to the equivalent oral dose form(s).   DESCRIPTION: These criteria include:  Patient being treated for a respiratory tract infection, urinary tract infection, cellulitis or clostridium difficile associated diarrhea if on metronidazole  The patient is not neutropenic and does not exhibit a GI malabsorption state  The patient is eating (either orally or via tube) and/or has been taking other orally administered medications for a least 24 hours  The patient is improving clinically and has a Tmax < 100.5  If you have questions about this conversion, please contact the Pharmacy Department  []   [phone] )  [facility] []   [phone] )  [facility] []   [phone] )  [facility] []   [phone] )  [facility] [x]   [phone] )  [facility]

## 2018-12-19 NOTE — Progress Notes (Signed)
SATURATION QUALIFICATIONS: (This note is used to comply with regulatory documentation for home oxygen)  Patient Saturations on Room Air at Rest = 93 %  Patient Saturations on Room Air while Ambulating = 87%  Patient Saturations on 2 Liters of oxygen while Ambulating = 94%  Please briefly explain why patient needs home oxygen: Patient's oxygen saturation drops while walking

## 2018-12-20 LAB — CULTURE, RESPIRATORY W GRAM STAIN
Culture: NORMAL
Gram Stain: NONE SEEN

## 2018-12-21 LAB — LEGIONELLA PNEUMOPHILA SEROGP 1 UR AG: L. pneumophila Serogp 1 Ur Ag: NEGATIVE

## 2018-12-22 LAB — CULTURE, BLOOD (ROUTINE X 2)
Culture: NO GROWTH
Culture: NO GROWTH
Special Requests: ADEQUATE

## 2018-12-22 NOTE — Addendum Note (Signed)
Addended by: Stanton Kidney on: 12/22/2018 05:49 PM   Modules accepted: Orders

## 2018-12-23 ENCOUNTER — Telehealth: Payer: Self-pay | Admitting: Cardiology

## 2018-12-23 NOTE — Telephone Encounter (Signed)
  Patient returning call regarding procedure

## 2018-12-23 NOTE — Telephone Encounter (Signed)
Pt aware I would call him tomorrow

## 2018-12-24 ENCOUNTER — Encounter: Payer: Self-pay | Admitting: *Deleted

## 2018-12-24 DIAGNOSIS — I4819 Other persistent atrial fibrillation: Secondary | ICD-10-CM

## 2018-12-24 DIAGNOSIS — Z01812 Encounter for preprocedural laboratory examination: Secondary | ICD-10-CM

## 2018-12-24 NOTE — Telephone Encounter (Signed)
Called pt. Procedure instructions reviewed.  Pt will stop by [facility] office tomorrow for pre procedure labs (recent labs last week abn) and to pick up CT & ablation instructions. Covid screening scheduled for 11/10. Aware office will call to schedule post procedure follow up. Patient verbalized understanding and agreeable to plan.

## 2018-12-25 ENCOUNTER — Other Ambulatory Visit: Payer: Self-pay

## 2018-12-25 DIAGNOSIS — Z01812 Encounter for preprocedural laboratory examination: Secondary | ICD-10-CM

## 2018-12-25 DIAGNOSIS — I4819 Other persistent atrial fibrillation: Secondary | ICD-10-CM

## 2018-12-26 LAB — BASIC METABOLIC PANEL
BUN/Creatinine Ratio: 15 (ref 10–24)
BUN: 17 mg/dL (ref 8–27)
CO2: 25 mmol/L (ref 20–29)
Calcium: 8.7 mg/dL (ref 8.6–10.2)
Chloride: 97 mmol/L (ref 96–106)
Creatinine, Ser: 1.13 mg/dL (ref 0.76–1.27)
GFR calc Af Amer: 73 mL/min/{1.73_m2} (ref >59)
GFR calc non Af Amer: 63 mL/min/{1.73_m2} (ref >59)
Glucose: 255 mg/dL — ABNORMAL HIGH (ref 65–99)
Potassium: 3.7 mmol/L (ref 3.5–5.2)
Sodium: 136 mmol/L (ref 134–144)

## 2018-12-26 LAB — CBC
Hematocrit: 35.5 % — ABNORMAL LOW (ref 37.5–51.0)
Hemoglobin: 11.6 g/dL — ABNORMAL LOW (ref 13.0–17.7)
MCH: 28.4 pg (ref 26.6–33.0)
MCHC: 32.7 g/dL (ref 31.5–35.7)
MCV: 87 fL (ref 79–97)
Platelets: 287 10*3/uL (ref 150–450)
RBC: 4.08 x10E6/uL — ABNORMAL LOW (ref 4.14–5.80)
RDW: 16.1 % — ABNORMAL HIGH (ref 11.6–15.4)
WBC: 11 10*3/uL — ABNORMAL HIGH (ref 3.4–10.8)

## 2018-12-28 ENCOUNTER — Other Ambulatory Visit: Payer: Self-pay | Admitting: Cardiology

## 2018-12-29 ENCOUNTER — Telehealth (INDEPENDENT_AMBULATORY_CARE_PROVIDER_SITE_OTHER): Payer: Medicare Other | Admitting: Cardiology

## 2018-12-29 ENCOUNTER — Other Ambulatory Visit: Payer: Self-pay | Admitting: Internal Medicine

## 2018-12-29 ENCOUNTER — Encounter: Payer: Self-pay | Admitting: Cardiology

## 2018-12-29 ENCOUNTER — Other Ambulatory Visit: Payer: Self-pay

## 2018-12-29 DIAGNOSIS — I4819 Other persistent atrial fibrillation: Secondary | ICD-10-CM

## 2018-12-29 NOTE — Progress Notes (Signed)
Electrophysiology TeleHealth Note   Due to national recommendations of social distancing due to COVID 19, an audio/video telehealth visit is felt to be most appropriate for this patient at this time.  See Epic message for the patient's consent to telehealth for [facility].   Date:  12/29/2018   ID:  Charles Lloyd., DOB 1940/06/18, MRN [id number]  Location: patient's home  Provider location: [address] [place]  Evaluation Performed: Follow-up visit  PCP:  Isaac Bliss, [name], MD  Cardiologist:  No primary care provider on file.  Electrophysiologist:  Dr Curt Bears  Chief Complaint:  AF  History of Present Illness:    Charles Lloyd. is a 78 y.o. male who presents via audio/video conferencing for a telehealth visit today.  Since last being seen in our clinic, the patient reports doing very well.  Today, he denies symptoms of palpitations, chest pain, shortness of breath,  lower extremity edema, dizziness, presyncope, or syncope.  The patient is otherwise without complaint today.  The patient denies symptoms of fevers, chills, cough, or new SOB worrisome for COVID 19.  He has a history of atrial fibrillation, systolic heart failure, diabetes, hypertension, hyperlipidemia.  He had an ablation at [facility] in 2015.  He presents with recurrent atrial fibrillation and is now planned for ablation 01/09/2019.  He was admitted to the hospital a few weeks ago with a viral respiratory illness.  He is continuing to improve.  He was ruled out for coronavirus.  Today, denies symptoms of palpitations, chest pain, shortness of breath, orthopnea, PND, lower extremity edema, claudication, dizziness, presyncope, syncope, bleeding, or neurologic sequela. The patient is tolerating medications without difficulties.    Past Medical History:  Diagnosis Date  . Atrial fibrillation ([place])   . B12 deficiency   . 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 Cancer  ([place])   . CHF (congestive heart failure) ([place]) 04/16/2013   Functional class II, ejection fraction 35-40%  . Chronic anticoagulation   . Chronic diastolic (congestive) heart failure (HCC) 04/16/2013   Functional class II, ejection fraction 35-40%  Last Assessment & Plan:  Clinically stable Volume well controlled meds reviewed  . Chronic diastolic CHF (congestive heart failure) ([place])   . Colon polyps 04/16/2013  . Diabetes mellitus without complication ([place])   . Diabetic neuropathy ([place])   . Essential hypertension 01/06/2010   Last Assessment & Plan:  Well controlled Continue med management  . 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  Steroid usage was discontinued several months ago.  Follow up with Dermatology as planned.  Marland Kitchen Heart murmur   . Hypercholesterolemia 01/06/2010   Last Assessment & Plan:  Repeat labs recommended  . Hyperlipidemia   . Hyperlipidemia associated with type 2 diabetes mellitus ([place]) 01/28/2018  . Hypertension   . Infection of prosthetic right knee joint ([place])   . 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 On amiodarone therapy 09/07/2013  . On continuous oral anticoagulation 01/28/2018  . Overweight (BMI 25.0-29.9) 10/22/2016  . Persistent atrial fibrillation ([place]) 04/16/2013   Last Assessment & Plan:  Rate controlled Continue med management eliquis for stroke preventino  Formatting of this note might be different from the original.  Drug  HX Current Rx Pre-ABL  inefficacy Pre-ABL intolerant Post-ABL inefficacy Post-ABL intolerant max dose/24h M/Y end comments  sotalol                  dofetilide                  flecainide                  propafenone                  am  . S/P TKR (total knee replacement), bilateral   . Tinea cruris 04/16/2013  . Type 2 diabetes mellitus with diabetic  polyneuropathy, without long-term current use of insulin ([place]) 04/16/2013   Last Assessment & Plan:  Labs today Pt reports well controlled on ambulatory monitoring    Past Surgical History:  Procedure Laterality Date  . CARDIAC ELECTROPHYSIOLOGY STUDY AND ABLATION    . CARDIOVERSION    . CARDIOVERSION N/A 10/10/2018   Procedure: CARDIOVERSION;  Surgeon: [name], MD;  Location: [facility] ENDOSCOPY;  Service: Cardiovascular;  Laterality: N/A;  . CHOLECYSTECTOMY    . PROSTATECTOMY    . REPLACEMENT TOTAL KNEE BILATERAL      Current Outpatient Medications  Medication Sig Dispense Refill  . albuterol (PROVENTIL) (2.5 MG/3ML) 0.083% nebulizer solution Take 3 mLs (2.5 mg total) by nebulization every 6 (six) hours as needed for wheezing or shortness of breath. 75 mL 0  . atorvastatin (LIPITOR) 20 MG tablet TAKE 1 TABLET DAILY AT 6 P.M. (Patient taking differently: Take 20 mg by mouth daily. ) 90 tablet 1  . diltiazem (CARDIZEM CD) 360 MG 24 hr capsule TAKE 1 CAPSULE DAILY 90 capsule 1  . ELIQUIS 5 MG TABS tablet TAKE 1 TABLET TWICE A DAY 180 tablet 1  . furosemide (LASIX) 40 MG tablet TAKE 1 TABLET BY MOUTH TWICE A DAY 180 tablet 1  . gabapentin (NEURONTIN) 300 MG capsule Take 2 capsules (600 mg total) by mouth at bedtime. 180 capsule 1  . glucose blood ([employer] TEST STRIPS) test strip Test 2x dailyDx: type 2 diabetes, uncontrolledfluctuating blood sugars 100 each 0  . glucose blood test strip Use to test blood sugars once daily as directed Dx:E11.65    . guaiFENesin-dextromethorphan (ROBITUSSIN DM) 100-10 MG/5ML syrup Take 5 mLs by mouth every 4 (four) hours as needed for cough. 118 mL 0  . lisinopril (ZESTRIL) 10 MG tablet Take 1 tablet (10 mg total) by mouth every evening. 90 tablet 1  . metFORMIN (GLUCOPHAGE) 500 MG tablet Take 1 tablet (500 mg total) by mouth 2 (two) times daily with a meal. 180 tablet 1  . metoprolol tartrate (LOPRESSOR) 50 MG tablet Take 100 mg by mouth 2 (two) times daily.      . mometasone (NASONEX) 50 MCG/ACT nasal spray Place 2 sprays into the nose 2 (two) times daily.     . montelukast (SINGULAIR) 10 MG tablet Take 10 mg by mouth at bedtime.     . potassium chloride SA (K-DUR) 20 MEQ tablet Take 1 tablet (20 mEq total) by mouth daily. 30 tablet 3  . predniSONE (DELTASONE) 10 MG tablet Take 3 tabs daily for 2 days, then 2 tabs daily for 2 days, then 1 tab daily for 2 days, then stop. 12 tablet 0   No current facility-administered medications for this visit.     Allergies:   Patient has no known allergies.   Social History:  The patient  reports that he has never smoked. He has  never used smokeless tobacco. He reports current alcohol use of about 2.0 standard drinks of alcohol per week. He reports that he does not use drugs.   Family History:  The patient's  family history includes Cancer in his father and mother; Depression in his father and mother; Early death in his father and mother.   ROS:  Please see the history of present illness.   All other systems are personally reviewed and negative.    Exam:    Vital Signs:  Ht 5\' 10"  (1.778 m)   Wt 183 lb (83 kg)   BMI 26.26 kg/m   no acute distress, no shortness of breath.  Labs/Other Tests and Data Reviewed:    Recent Labs: 07/24/2018: TSH 0.87 11/06/2018: NT-Pro BNP 1,062 12/17/2018: ALT 23; B Natriuretic Peptide 249.4 12/25/2018: BUN 17; Creatinine, Ser 1.13; Hemoglobin 11.6; Platelets 287; Potassium 3.7; Sodium 136   Wt Readings from Last 3 Encounters:  12/29/18 183 lb (83 kg)  12/19/18 193 lb 9 oz (87.8 kg)  11/25/18 184 lb 4.8 oz (83.6 kg)     Other studies personally reviewed: Additional studies/ records that were reviewed today include: ECG 12/17/18  Review of the above records today demonstrates: Atrial fibrillation, rate 72   ASSESSMENT & PLAN:    1.  Persistent atrial fibrillation: Currently on Eliquis.  We Tifany Hirsch plan for ablation 01/09/2019.  Risks and benefits of ablation were  discussed.  Risks include bleeding, tamponade, heart block, stroke, damage surrounding organs.  He understands these risks and is agreed to the procedure.   COVID 19 screen The patient denies symptoms of COVID 19 at this time.  The importance of social distancing was discussed today.  Follow-up:  3 months  Current medicines are reviewed at length with the patient today.   The patient does not have concerns regarding his medicines.  The following changes were made today:  none  Labs/ tests ordered today include:  No orders of the defined types were placed in this encounter.    Patient Risk:  after full review of this patients clinical status, I feel that they are at moderate risk at this time.  Today, I have spent 8 minutes with the patient with telehealth technology discussing AF .    Signed, Tannisha Kennington Meredith Leeds, MD  12/29/2018 4:29 PM     [address] [id number] [phone] (office) [phone] (fax)

## 2018-12-30 ENCOUNTER — Telehealth (INDEPENDENT_AMBULATORY_CARE_PROVIDER_SITE_OTHER): Payer: Medicare Other | Admitting: Internal Medicine

## 2018-12-30 ENCOUNTER — Other Ambulatory Visit: Payer: Self-pay | Admitting: Internal Medicine

## 2018-12-30 ENCOUNTER — Other Ambulatory Visit: Payer: Self-pay

## 2018-12-30 DIAGNOSIS — J9601 Acute respiratory failure with hypoxia: Secondary | ICD-10-CM

## 2018-12-30 DIAGNOSIS — I5032 Chronic diastolic (congestive) heart failure: Secondary | ICD-10-CM

## 2018-12-30 DIAGNOSIS — I4819 Other persistent atrial fibrillation: Secondary | ICD-10-CM

## 2018-12-30 DIAGNOSIS — Z09 Encounter for follow-up examination after completed treatment for conditions other than malignant neoplasm: Secondary | ICD-10-CM

## 2018-12-30 MED ORDER — ALBUTEROL SULFATE (2.5 MG/3ML) 0.083% IN NEBU: 2.5000 mg | INHALATION_SOLUTION | Freq: Four times a day (QID) | RESPIRATORY_TRACT | 0 refills | Status: DC | PRN

## 2018-12-30 NOTE — Progress Notes (Signed)
Virtual Visit via Video Note  I connected with Charles Lloyd. on 12/30/18 at  2:00 PM EST by a video enabled telemedicine application and verified that I am speaking with the correct person using two identifiers.  Location patient: home Location provider: work office Persons participating in the virtual visit: patient, provider  I discussed the limitations of evaluation and management by telemedicine and the availability of in person appointments. The patient expressed understanding and agreed to proceed.   HPI: I have connected with Charles Lloyd today to discuss his recent hospital visit.  He was admitted to the hospital from 10/21-10/23 due to acute hypoxemic respiratory failure.  Despite extensive work-up, etiology for his hypoxemia was not obtained: Initial normal chest x-ray, normal labs including cultures, negative SARS-CoV, normal BNP, no history of COPD/asthma, negative procalcitonin.  He did receive 3 days of IV antibiotics but was sent home without.  He has been using albuterol which she states really helps loosen up mucus and he feels better afterwards, he has run out and would like me to refill the prescription today.  He has been improving since his discharge, and is no longer using oxygen, is afebrile.  He has an atrial fibrillation ablation scheduled for November 13.   ROS: Constitutional: Denies fever, chills, diaphoresis, appetite change and fatigue.  HEENT: Denies photophobia, eye pain, redness, hearing loss, ear pain, congestion, sore throat, rhinorrhea, sneezing, mouth sores, trouble swallowing, neck pain, neck stiffness and tinnitus.   Respiratory: Denies SOB, DOE, chest tightness,  and wheezing.   Cardiovascular: Denies chest pain, palpitations and leg swelling.  Gastrointestinal: Denies nausea, vomiting, abdominal pain, diarrhea, constipation, blood in stool and abdominal distention.  Genitourinary: Denies dysuria, urgency, frequency, hematuria, flank pain and  difficulty urinating.  Endocrine: Denies: hot or cold intolerance, sweats, changes in hair or nails, polyuria, polydipsia. Musculoskeletal: Denies myalgias, back pain, joint swelling, arthralgias and gait problem.  Skin: Denies pallor, rash and wound.  Neurological: Denies dizziness, seizures, syncope, weakness, light-headedness, numbness and headaches.  Hematological: Denies adenopathy. Easy bruising, personal or family bleeding history  Psychiatric/Behavioral: Denies suicidal ideation, mood changes, confusion, nervousness, sleep disturbance and agitation   Past Medical History:  Diagnosis Date  . Atrial fibrillation ([place])   . B12 deficiency   . 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 Cancer ([place])   . CHF (congestive heart failure) ([place]) 04/16/2013   Functional class II, ejection fraction 35-40%  . Chronic anticoagulation   . Chronic diastolic (congestive) heart failure (HCC) 04/16/2013   Functional class II, ejection fraction 35-40%  Last Assessment & Plan:  Clinically stable Volume well controlled meds reviewed  . Chronic diastolic CHF (congestive heart failure) ([place])   . Colon polyps 04/16/2013  . Diabetes mellitus without complication ([place])   . Diabetic neuropathy ([place])   . Essential hypertension 01/06/2010   Last Assessment & Plan:  Well controlled Continue med management  . 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  Steroid usage was discontinued several months ago.  Follow up with Dermatology as planned.  Marland Kitchen Heart murmur   . Hypercholesterolemia 01/06/2010   Last Assessment & Plan:  Repeat labs recommended  . Hyperlipidemia   . Hyperlipidemia associated with type 2 diabetes mellitus ([place]) 01/28/2018  . Hypertension   . Infection of prosthetic right knee joint ([place])   . Insomnia 03/04/2012   Grief with loss of wife 02/20/12  Last Assessment &  Plan:  Improved sx  contineu xanax prn Se discussed  . Neoplasm  of prostate 04/16/2013  . On amiodarone therapy 09/07/2013  . On continuous oral anticoagulation 01/28/2018  . Overweight (BMI 25.0-29.9) 10/22/2016  . Persistent atrial fibrillation ([place]) 04/16/2013   Last Assessment & Plan:  Rate controlled Continue med management eliquis for stroke preventino  Formatting of this note might be different from the original.  Drug  HX Current Rx Pre-ABL inefficacy Pre-ABL intolerant Post-ABL inefficacy Post-ABL intolerant max dose/24h M/Y end comments  sotalol                  dofetilide                  flecainide                  propafenone                  am  . S/P TKR (total knee replacement), bilateral   . Tinea cruris 04/16/2013  . Type 2 diabetes mellitus with diabetic polyneuropathy, without long-term current use of insulin ([place]) 04/16/2013   Last Assessment & Plan:  Labs today Pt reports well controlled on ambulatory monitoring    Past Surgical History:  Procedure Laterality Date  . CARDIAC ELECTROPHYSIOLOGY STUDY AND ABLATION    . CARDIOVERSION    . CARDIOVERSION N/A 10/10/2018   Procedure: CARDIOVERSION;  Surgeon: [name], MD;  Location: [facility] ENDOSCOPY;  Service: Cardiovascular;  Laterality: N/A;  . CHOLECYSTECTOMY    . PROSTATECTOMY    . REPLACEMENT TOTAL KNEE BILATERAL      Family History  Problem Relation Age of Onset  . Cancer Mother   . Depression Mother   . Early death Mother   . Cancer Father   . Depression Father   . Early death Father     SOCIAL HX:   reports that he has never smoked. He has never used smokeless tobacco. He reports current alcohol use of about 2.0 standard drinks of alcohol per week. He reports that he does not use drugs.   Current Outpatient Medications:  .  albuterol (PROVENTIL) (2.5 MG/3ML) 0.083% nebulizer solution, Take 3 mLs (2.5 mg total) by nebulization every 6 (six) hours as needed for wheezing or shortness of breath., Disp: 75 mL, Rfl: 0 .  atorvastatin (LIPITOR) 20 MG tablet, TAKE 1 TABLET DAILY AT  6 P.M., Disp: 90 tablet, Rfl: 1 .  diltiazem (CARDIZEM CD) 360 MG 24 hr capsule, TAKE 1 CAPSULE DAILY, Disp: 90 capsule, Rfl: 1 .  ELIQUIS 5 MG TABS tablet, TAKE 1 TABLET TWICE A DAY, Disp: 180 tablet, Rfl: 3 .  furosemide (LASIX) 40 MG tablet, TAKE 1 TABLET BY MOUTH TWICE A DAY, Disp: 180 tablet, Rfl: 1 .  gabapentin (NEURONTIN) 300 MG capsule, Take 2 capsules (600 mg total) by mouth at bedtime., Disp: 180 capsule, Rfl: 1 .  glucose blood ([employer] TEST STRIPS) test strip, Test 2x dailyDx: type 2 diabetes, uncontrolledfluctuating blood sugars, Disp: 100 each, Rfl: 0 .  glucose blood test strip, Use to test blood sugars once daily as directed Dx:E11.65, Disp: , Rfl:  .  guaiFENesin-dextromethorphan (ROBITUSSIN DM) 100-10 MG/5ML syrup, Take 5 mLs by mouth every 4 (four) hours as needed for cough., Disp: 118 mL, Rfl: 0 .  lisinopril (ZESTRIL) 10 MG tablet, Take 1 tablet (10 mg total) by mouth every evening., Disp: 90 tablet, Rfl: 1 .  metFORMIN (GLUCOPHAGE) 500 MG tablet, Take 1  tablet (500 mg total) by mouth 2 (two) times daily with a meal., Disp: 180 tablet, Rfl: 1 .  metoprolol tartrate (LOPRESSOR) 50 MG tablet, Take 100 mg by mouth 2 (two) times daily. , Disp: , Rfl:  .  mometasone (NASONEX) 50 MCG/ACT nasal spray, Place 2 sprays into the nose 2 (two) times daily. , Disp: , Rfl:  .  montelukast (SINGULAIR) 10 MG tablet, Take 10 mg by mouth at bedtime. , Disp: , Rfl:  .  potassium chloride SA (K-DUR) 20 MEQ tablet, Take 1 tablet (20 mEq total) by mouth daily., Disp: 30 tablet, Rfl: 3 .  predniSONE (DELTASONE) 10 MG tablet, Take 3 tabs daily for 2 days, then 2 tabs daily for 2 days, then 1 tab daily for 2 days, then stop., Disp: 12 tablet, Rfl: 0  EXAM:   VITALS per patient if applicable: Blood pressure 142/75, heart rate 93, O2 sats 96% on room air  GENERAL: alert, oriented, appears well and in no acute distress  HEENT: atraumatic, conjunttiva clear, no obvious abnormalities on inspection of  external nose and ears  NECK: normal movements of the head and neck  LUNGS: on inspection no signs of respiratory distress, breathing rate appears normal, no obvious gross increased work of breathing, gasping or wheezing  CV: no obvious cyanosis  MS: moves all visible extremities without noticeable abnormality  PSYCH/NEURO: pleasant and cooperative, no obvious depression or anxiety, speech and thought processing grossly intact  ASSESSMENT AND PLAN:   Hospital discharge follow-up Acute respiratory failure with hypoxia (HCC)  Chronic diastolic CHF (congestive heart failure) (HCC) Persistent atrial fibrillation (HCC)  -Seems to be improving post discharge, no longer using oxygen. -Will refill albuterol today, he has been cautioned against excessive use given his history of atrial fibrillation and potential for RVR. -Can consider repeating chest x-ray at request of discharging hospitalist physician once symptoms have completely resolved.   Time spent: 25 minutes. Greater than 50% of this time was spent in direct contact with the patient, coordinating care and discussing relevant ongoing clinical issues.    I discussed the assessment and treatment plan with the patient. The patient was provided an opportunity to ask questions and all were answered. The patient agreed with the plan and demonstrated an understanding of the instructions.   The patient was advised to call back or seek an in-person evaluation if the symptoms worsen or if the condition fails to improve as anticipated.    Lelon Frohlich, MD  [facility]

## 2019-01-02 ENCOUNTER — Telehealth (HOSPITAL_COMMUNITY): Payer: Self-pay | Admitting: Emergency Medicine

## 2019-01-02 ENCOUNTER — Telehealth: Payer: Self-pay | Admitting: *Deleted

## 2019-01-02 NOTE — Telephone Encounter (Signed)
Copied from [place] [id number]. Topic: General - Inquiry >> Jan 02, 2019  8:43 AM Mathis Bud wrote: Reason for CRM: patient is requesting a call back from nurse regarding his medications.  He states he is having trouble breathing at night, he states he is okay during the day.  Call back [phone]

## 2019-01-02 NOTE — Telephone Encounter (Signed)
Patient states for the last 2 nights he has had chest congestion at night making it difficult to breath.  He feels improvement in the morning when he is up and moving around.  Patient wants to know if he should refill his prednisone?    [facility]

## 2019-01-02 NOTE — Telephone Encounter (Signed)
Given his recent hospitalization for acute respiratory failure, I would feel uncomfortable prescribing meds for SOB without an in-person evaluation. Since we are unable to see him in office, unfortunately I think he should go to UC/ED so someone can assess him.

## 2019-01-02 NOTE — Telephone Encounter (Signed)
Spoke with patient and gave Dr Ledell Noss recommendations.  Patient declines UC/ED at this time, but if symptoms worsen he will go.  FYI

## 2019-01-02 NOTE — Telephone Encounter (Signed)
Reaching out to patient to offer assistance regarding upcoming cardiac imaging study; pt verbalizes understanding of appt date/time, parking situation and where to check in, pre-test NPO status and medications ordered, and verified current allergies; name and call back number provided for further questions should they arise Cortlin Marano RN Navigator Cardiac Imaging [facility] [phone] office [phone] cell 

## 2019-01-05 ENCOUNTER — Ambulatory Visit (HOSPITAL_COMMUNITY): Payer: Medicare Other

## 2019-01-05 ENCOUNTER — Telehealth: Payer: Self-pay | Admitting: Cardiology

## 2019-01-05 ENCOUNTER — Encounter (HOSPITAL_COMMUNITY): Payer: Self-pay | Admitting: Emergency Medicine

## 2019-01-05 ENCOUNTER — Other Ambulatory Visit: Payer: Self-pay

## 2019-01-05 ENCOUNTER — Emergency Department (HOSPITAL_COMMUNITY): Payer: Medicare Other

## 2019-01-05 ENCOUNTER — Observation Stay (HOSPITAL_COMMUNITY)
Admission: EM | Admit: 2019-01-05 | Discharge: 2019-01-06 | Disposition: A | Discharge status: Home / Self Care | Payer: Medicare Other | Attending: Internal Medicine | Admitting: Internal Medicine

## 2019-01-05 DIAGNOSIS — I5032 Chronic diastolic (congestive) heart failure: Secondary | ICD-10-CM | POA: Insufficient documentation

## 2019-01-05 DIAGNOSIS — J209 Acute bronchitis, unspecified: Secondary | ICD-10-CM | POA: Diagnosis present

## 2019-01-05 DIAGNOSIS — R000 Tachycardia, unspecified: Secondary | ICD-10-CM | POA: Diagnosis not present

## 2019-01-05 DIAGNOSIS — Z96653 Presence of artificial knee joint, bilateral: Secondary | ICD-10-CM | POA: Insufficient documentation

## 2019-01-05 DIAGNOSIS — Z7901 Long term (current) use of anticoagulants: Secondary | ICD-10-CM | POA: Diagnosis not present

## 2019-01-05 DIAGNOSIS — R0982 Postnasal drip: Secondary | ICD-10-CM | POA: Diagnosis not present

## 2019-01-05 DIAGNOSIS — I110 Hypertensive heart disease with heart failure: Secondary | ICD-10-CM | POA: Insufficient documentation

## 2019-01-05 DIAGNOSIS — I4819 Other persistent atrial fibrillation: Secondary | ICD-10-CM | POA: Insufficient documentation

## 2019-01-05 DIAGNOSIS — R0601 Orthopnea: Secondary | ICD-10-CM

## 2019-01-05 DIAGNOSIS — R0602 Shortness of breath: Secondary | ICD-10-CM

## 2019-01-05 DIAGNOSIS — R0600 Dyspnea, unspecified: Secondary | ICD-10-CM

## 2019-01-05 DIAGNOSIS — Z85828 Personal history of other malignant neoplasm of skin: Secondary | ICD-10-CM | POA: Diagnosis not present

## 2019-01-05 DIAGNOSIS — Z20828 Contact with and (suspected) exposure to other viral communicable diseases: Secondary | ICD-10-CM | POA: Insufficient documentation

## 2019-01-05 DIAGNOSIS — Z79899 Other long term (current) drug therapy: Secondary | ICD-10-CM | POA: Diagnosis not present

## 2019-01-05 DIAGNOSIS — E1142 Type 2 diabetes mellitus with diabetic polyneuropathy: Secondary | ICD-10-CM | POA: Insufficient documentation

## 2019-01-05 DIAGNOSIS — I482 Chronic atrial fibrillation, unspecified: Secondary | ICD-10-CM

## 2019-01-05 DIAGNOSIS — J449 Chronic obstructive pulmonary disease, unspecified: Secondary | ICD-10-CM | POA: Insufficient documentation

## 2019-01-05 DIAGNOSIS — Z7984 Long term (current) use of oral hypoglycemic drugs: Secondary | ICD-10-CM | POA: Insufficient documentation

## 2019-01-05 DIAGNOSIS — R0609 Other forms of dyspnea: Secondary | ICD-10-CM

## 2019-01-05 DIAGNOSIS — E785 Hyperlipidemia, unspecified: Secondary | ICD-10-CM | POA: Diagnosis not present

## 2019-01-05 HISTORY — DX: Acute bronchitis, unspecified: J20.9

## 2019-01-05 LAB — URINALYSIS, ROUTINE W REFLEX MICROSCOPIC
Bilirubin Urine: NEGATIVE
Glucose, UA: NEGATIVE mg/dL
Hgb urine dipstick: NEGATIVE
Ketones, ur: NEGATIVE mg/dL
Leukocytes,Ua: NEGATIVE
Nitrite: NEGATIVE
Protein, ur: NEGATIVE mg/dL
Specific Gravity, Urine: 1.008 (ref 1.005–1.030)
pH: 7 (ref 5.0–8.0)

## 2019-01-05 LAB — LACTIC ACID, PLASMA
Lactic Acid, Venous: 1.3 mmol/L (ref 0.5–1.9)
Lactic Acid, Venous: 1.3 mmol/L (ref 0.5–1.9)

## 2019-01-05 LAB — COMPREHENSIVE METABOLIC PANEL
ALT: 24 U/L (ref 0–44)
AST: 24 U/L (ref 15–41)
Albumin: 4 g/dL (ref 3.5–5.0)
Alkaline Phosphatase: 95 U/L (ref 38–126)
Anion gap: 11 (ref 5–15)
BUN: 12 mg/dL (ref 8–23)
CO2: 25 mmol/L (ref 22–32)
Calcium: 9.1 mg/dL (ref 8.9–10.3)
Chloride: 99 mmol/L (ref 98–111)
Creatinine, Ser: 0.93 mg/dL (ref 0.61–1.24)
GFR calc Af Amer: >60 mL/min (ref >60)
GFR calc non Af Amer: >60 mL/min (ref >60)
Glucose, Bld: 164 mg/dL — ABNORMAL HIGH (ref 70–99)
Potassium: 4 mmol/L (ref 3.5–5.1)
Sodium: 135 mmol/L (ref 135–145)
Total Bilirubin: 1.4 mg/dL — ABNORMAL HIGH (ref 0.3–1.2)
Total Protein: 6.9 g/dL (ref 6.5–8.1)

## 2019-01-05 LAB — CBC WITH DIFFERENTIAL/PLATELET
Abs Immature Granulocytes: 0.03 10*3/uL (ref 0.00–0.07)
Basophils Absolute: 0.1 10*3/uL (ref 0.0–0.1)
Basophils Relative: 1 %
Eosinophils Absolute: 1.7 10*3/uL — ABNORMAL HIGH (ref 0.0–0.5)
Eosinophils Relative: 24 %
HCT: 39.9 % (ref 39.0–52.0)
Hemoglobin: 12.6 g/dL — ABNORMAL LOW (ref 13.0–17.0)
Immature Granulocytes: 0 %
Lymphocytes Relative: 11 %
Lymphs Abs: 0.8 10*3/uL (ref 0.7–4.0)
MCH: 28.3 pg (ref 26.0–34.0)
MCHC: 31.6 g/dL (ref 30.0–36.0)
MCV: 89.7 fL (ref 80.0–100.0)
Monocytes Absolute: 0.5 10*3/uL (ref 0.1–1.0)
Monocytes Relative: 8 %
Neutro Abs: 4 10*3/uL (ref 1.7–7.7)
Neutrophils Relative %: 56 %
Platelets: 229 10*3/uL (ref 150–400)
RBC: 4.45 MIL/uL (ref 4.22–5.81)
RDW: 16.2 % — ABNORMAL HIGH (ref 11.5–15.5)
WBC: 7.1 10*3/uL (ref 4.0–10.5)
nRBC: 0 % (ref 0.0–0.2)

## 2019-01-05 LAB — PROTIME-INR
INR: 1.4 — ABNORMAL HIGH (ref 0.8–1.2)
Prothrombin Time: 16.9 seconds — ABNORMAL HIGH (ref 11.4–15.2)

## 2019-01-05 LAB — BRAIN NATRIURETIC PEPTIDE: B Natriuretic Peptide: 590.7 pg/mL — ABNORMAL HIGH (ref 0.0–100.0)

## 2019-01-05 LAB — TROPONIN I (HIGH SENSITIVITY)
Troponin I (High Sensitivity): 3 ng/L (ref <18)
Troponin I (High Sensitivity): 3 ng/L (ref <18)

## 2019-01-05 LAB — BLOOD GAS, VENOUS
Acid-Base Excess: 3 mmol/L — ABNORMAL HIGH (ref 0.0–2.0)
Bicarbonate: 27.5 mmol/L (ref 20.0–28.0)
O2 Saturation: 57.4 %
Patient temperature: 98.6
pCO2, Ven: 43.5 mmHg — ABNORMAL LOW (ref 44.0–60.0)
pH, Ven: 7.417 (ref 7.250–7.430)
pO2, Ven: 33.8 mmHg (ref 32.0–45.0)

## 2019-01-05 LAB — SARS CORONAVIRUS 2 (TAT 6-24 HRS): SARS Coronavirus 2: NEGATIVE

## 2019-01-05 LAB — GLUCOSE, CAPILLARY: Glucose-Capillary: 229 mg/dL — ABNORMAL HIGH (ref 70–99)

## 2019-01-05 IMAGING — CR DG CHEST 2V
2 series · 2 of 2 positions shown · non-contrast
Comparison: [DATE]

CLINICAL DATA: Shortness of breath, weakness, COPD, cough

EXAM:
CHEST - 2 VIEW

[w chest pa]
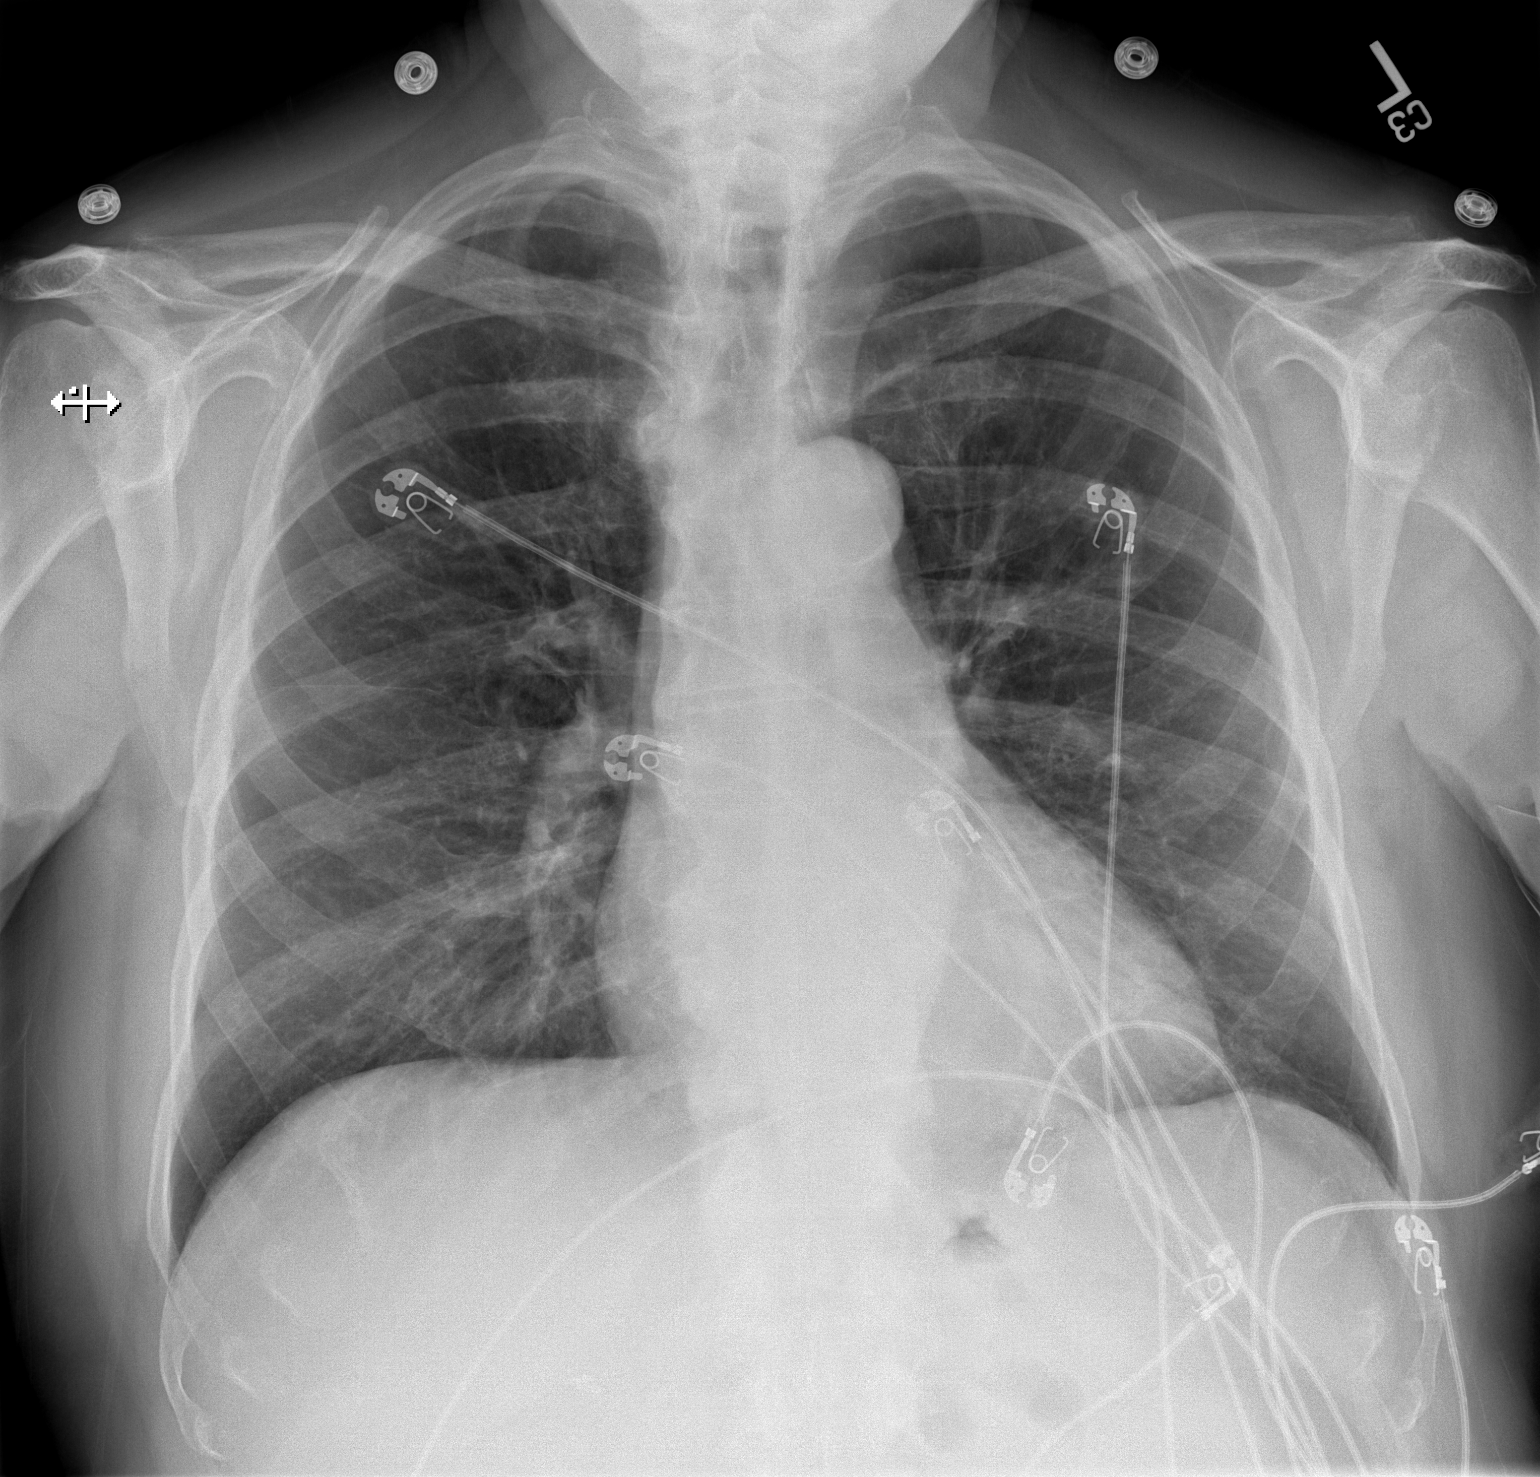

[w chest lat]
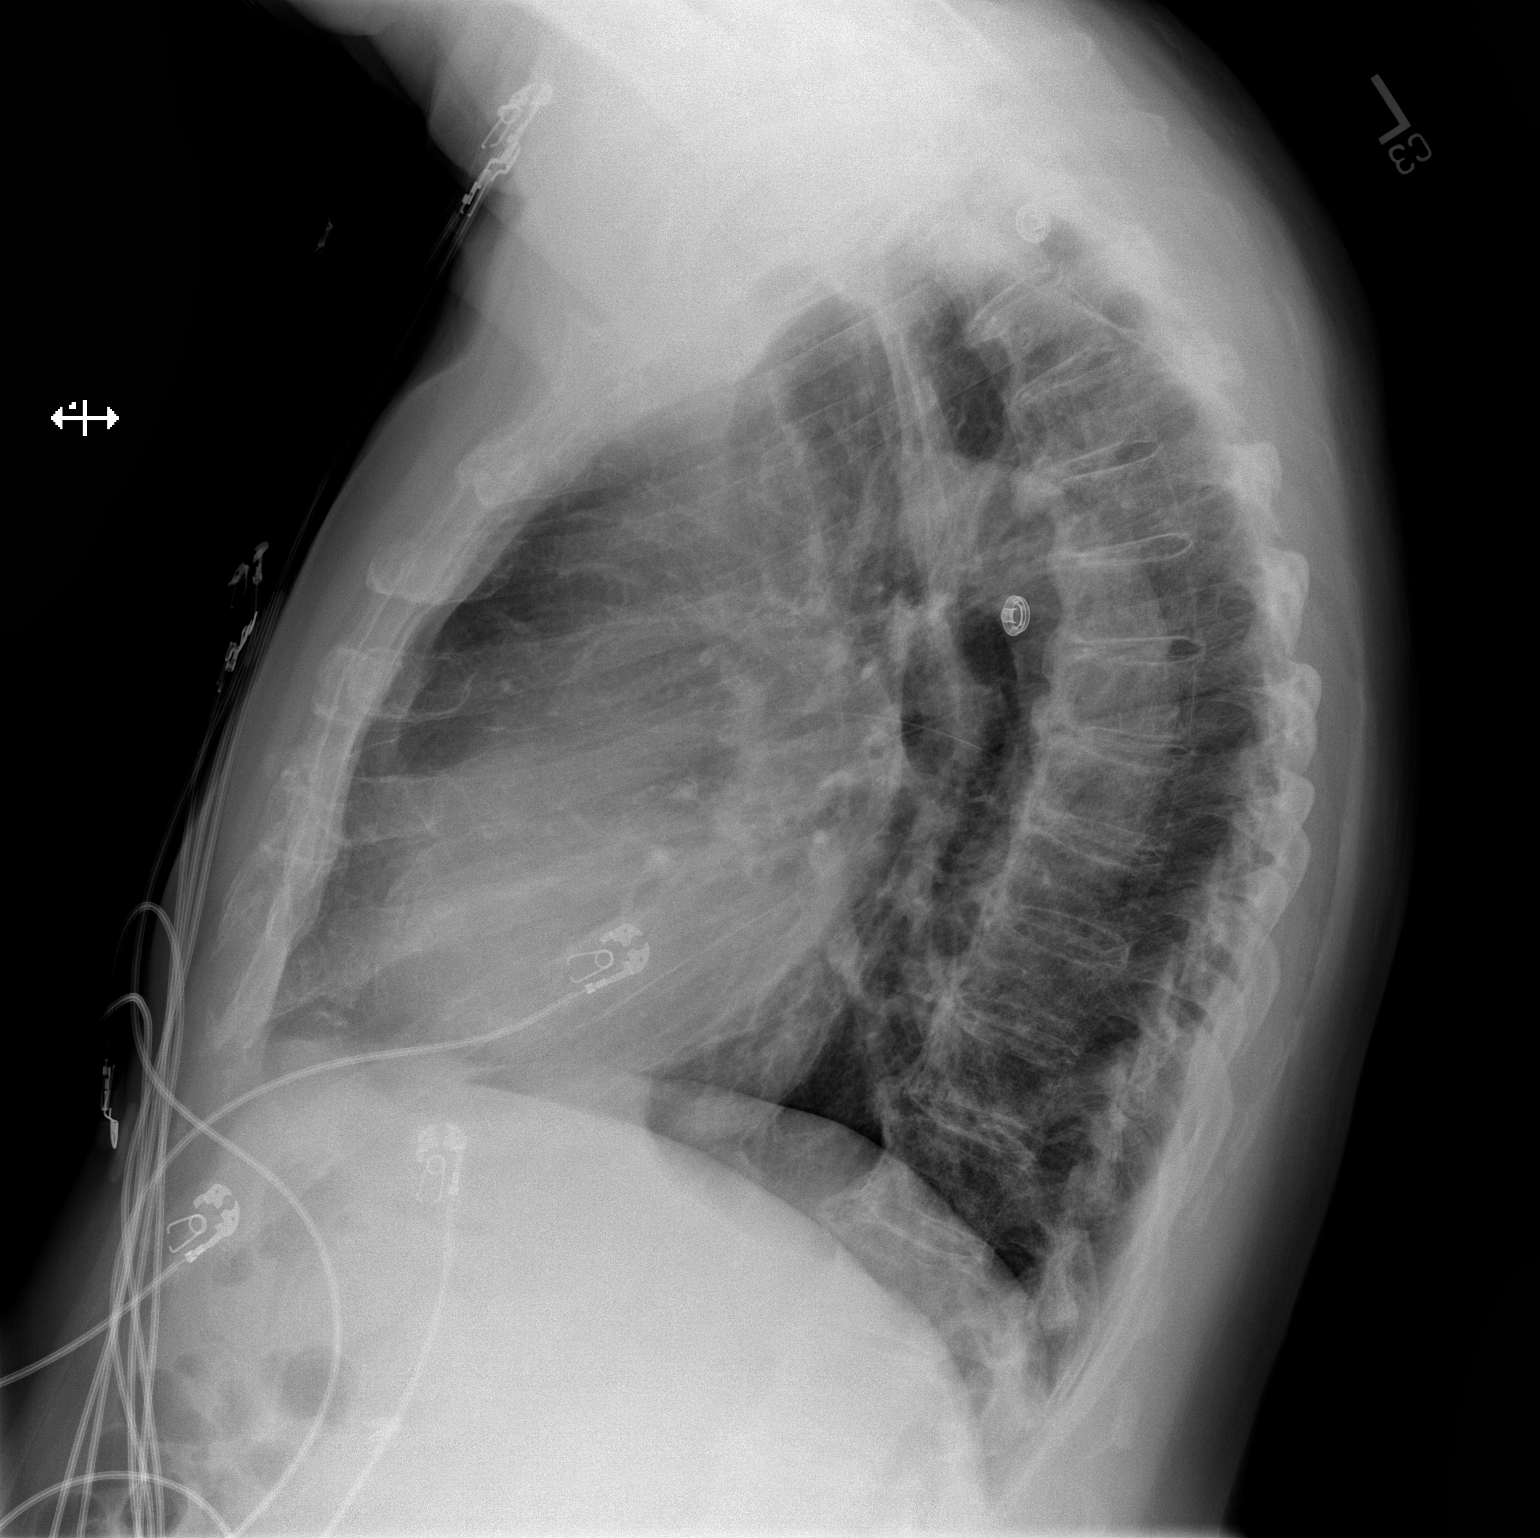

[2 of 2 positions shown; findings below may reference images not displayed]

FINDINGS: Upper normal heart size.

Mediastinal contours and pulmonary vascularity normal.

Atherosclerotic calcification aorta.

Lungs clear.

No acute infiltrate, pleural effusion or pneumothorax.

Bones demineralized with scattered degenerative disc disease changes
of the thoracic spine.
IMPRESSION: No acute abnormalities.

## 2019-01-05 MED ORDER — FUROSEMIDE 10 MG/ML IJ SOLN
40.0000 mg | Freq: Two times a day (BID) | INTRAMUSCULAR | Status: DC
  Administered 2019-01-06: 40 mg via INTRAVENOUS
  Filled 2019-01-05: qty 4

## 2019-01-05 MED ORDER — POTASSIUM CHLORIDE CRYS ER 20 MEQ PO TBCR
20.0000 meq | EXTENDED_RELEASE_TABLET | Freq: Every day | ORAL | Status: DC
  Administered 2019-01-06: 20 meq via ORAL
  Filled 2019-01-05: qty 1

## 2019-01-05 MED ORDER — IPRATROPIUM-ALBUTEROL 0.5-2.5 (3) MG/3ML IN SOLN
3.0000 mL | Freq: Four times a day (QID) | RESPIRATORY_TRACT | Status: DC
  Administered 2019-01-06: 3 mL via RESPIRATORY_TRACT
  Filled 2019-01-05: qty 3

## 2019-01-05 MED ORDER — INSULIN ASPART 100 UNIT/ML ~~LOC~~ SOLN
0.0000 [IU] | Freq: Every day | SUBCUTANEOUS | Status: DC
  Administered 2019-01-05: 2 [IU] via SUBCUTANEOUS
  Filled 2019-01-05: qty 0.05

## 2019-01-05 MED ORDER — ALBUTEROL SULFATE HFA 108 (90 BASE) MCG/ACT IN AERS
2.0000 | INHALATION_SPRAY | RESPIRATORY_TRACT | Status: DC | PRN
  Filled 2019-01-05 (×2): qty 6.7

## 2019-01-05 MED ORDER — METOPROLOL TARTRATE 50 MG PO TABS
100.0000 mg | ORAL_TABLET | Freq: Two times a day (BID) | ORAL | Status: DC
  Administered 2019-01-05 – 2019-01-06 (×2): 100 mg via ORAL
  Filled 2019-01-05: qty 2
  Filled 2019-01-05: qty 4

## 2019-01-05 MED ORDER — LISINOPRIL 10 MG PO TABS: 10.0000 mg | ORAL_TABLET | Freq: Every evening | ORAL | Status: DC

## 2019-01-05 MED ORDER — GUAIFENESIN ER 600 MG PO TB12
600.0000 mg | ORAL_TABLET | Freq: Two times a day (BID) | ORAL | Status: DC
  Administered 2019-01-05 – 2019-01-06 (×2): 600 mg via ORAL
  Filled 2019-01-05 (×2): qty 1

## 2019-01-05 MED ORDER — ALBUTEROL SULFATE (2.5 MG/3ML) 0.083% IN NEBU: 2.5000 mg | INHALATION_SOLUTION | RESPIRATORY_TRACT | Status: DC | PRN

## 2019-01-05 MED ORDER — FLUTICASONE PROPIONATE 50 MCG/ACT NA SUSP
2.0000 | Freq: Every day | NASAL | Status: DC
  Administered 2019-01-06: 2 via NASAL
  Filled 2019-01-05: qty 16

## 2019-01-05 MED ORDER — IPRATROPIUM-ALBUTEROL 0.5-2.5 (3) MG/3ML IN SOLN: 3.0000 mL | Freq: Four times a day (QID) | RESPIRATORY_TRACT | Status: DC | PRN

## 2019-01-05 MED ORDER — AMOXICILLIN-POT CLAVULANATE 875-125 MG PO TABS
1.0000 | ORAL_TABLET | Freq: Two times a day (BID) | ORAL | Status: DC
  Administered 2019-01-06: 1 via ORAL
  Filled 2019-01-05: qty 1

## 2019-01-05 MED ORDER — PREDNISONE 20 MG PO TABS
60.0000 mg | ORAL_TABLET | Freq: Every day | ORAL | Status: DC
  Administered 2019-01-06: 60 mg via ORAL
  Filled 2019-01-05: qty 3

## 2019-01-05 MED ORDER — BUDESONIDE 0.25 MG/2ML IN SUSP
0.2500 mg | Freq: Two times a day (BID) | RESPIRATORY_TRACT | Status: DC
  Administered 2019-01-06 (×2): 0.25 mg via RESPIRATORY_TRACT
  Filled 2019-01-05 (×2): qty 2

## 2019-01-05 MED ORDER — MONTELUKAST SODIUM 10 MG PO TABS: 10.0000 mg | ORAL_TABLET | Freq: Every day | ORAL | Status: DC

## 2019-01-05 MED ORDER — METHYLPREDNISOLONE SODIUM SUCC 125 MG IJ SOLR
125.0000 mg | Freq: Once | INTRAMUSCULAR | Status: AC
  Administered 2019-01-05: 125 mg via INTRAVENOUS
  Filled 2019-01-05: qty 2

## 2019-01-05 MED ORDER — DILTIAZEM HCL ER COATED BEADS 180 MG PO CP24
360.0000 mg | ORAL_CAPSULE | Freq: Every day | ORAL | Status: DC
  Administered 2019-01-06: 360 mg via ORAL
  Filled 2019-01-05: qty 2

## 2019-01-05 MED ORDER — APIXABAN 5 MG PO TABS
5.0000 mg | ORAL_TABLET | Freq: Two times a day (BID) | ORAL | Status: DC
  Administered 2019-01-06: 5 mg via ORAL
  Filled 2019-01-05: qty 1

## 2019-01-05 MED ORDER — ATORVASTATIN CALCIUM 20 MG PO TABS
20.0000 mg | ORAL_TABLET | Freq: Every day | ORAL | Status: DC
  Administered 2019-01-06: 20 mg via ORAL
  Filled 2019-01-05: qty 1

## 2019-01-05 MED ORDER — FUROSEMIDE 10 MG/ML IJ SOLN
40.0000 mg | Freq: Once | INTRAMUSCULAR | Status: AC
  Administered 2019-01-05: 40 mg via INTRAVENOUS
  Filled 2019-01-05: qty 4

## 2019-01-05 MED ORDER — GABAPENTIN 300 MG PO CAPS
600.0000 mg | ORAL_CAPSULE | Freq: Every day | ORAL | Status: DC
  Administered 2019-01-05: 600 mg via ORAL
  Filled 2019-01-05: qty 2

## 2019-01-05 MED ORDER — INSULIN ASPART 100 UNIT/ML ~~LOC~~ SOLN
0.0000 [IU] | Freq: Three times a day (TID) | SUBCUTANEOUS | Status: DC
  Administered 2019-01-06 (×2): 5 [IU] via SUBCUTANEOUS
  Filled 2019-01-05: qty 0.09

## 2019-01-05 NOTE — Telephone Encounter (Signed)
Followed up w/ patient who reports that he is not feeling well.  Pt answered phone coughing and reporting continued issues w/ breathing.  Pt is currently on O2 and feels like he can't get enough air.  His son is about to take him to [facility] for evaluation. Pt cancelled CT scheduled for today.  Pt aware I will follow up tomorrow to see what ED visit shows,  Before cancelling ablation procedure scheduled for this Friday.  Dr. Curt Bears made aware. Patient verbalized understanding and agreeable to plan.

## 2019-01-05 NOTE — ED Notes (Signed)
Patient given meal tray.

## 2019-01-05 NOTE — H&P (Signed)
History and Physical  [name] [id number] DOB: 11-13-1940 DOA: 01/05/2019  Referring physician: ER provider PCP: Isaac Bliss, [name], MD  Outpatient Specialists:    Patient coming from: Home  Chief Complaint: Cough with excessive sputum production.  HPI:  Patient is a 78 year old Caucasian male with past medical history significant for hypertension, hyperlipidemia, diabetes mellitus type 2, chronic atrial fibrillation on Eliquis (currently awaiting ablation), chronic diastolic congestive heart failure and recent inpatient care for reactive airway disease with acute hypoxic respiratory failure and hyponatremia with volume depletion.  Patient was discharged from the hospital on 12/19/2018 on supplemental oxygen and tapering dose of oral steroids.  Patient presents with recurrent of symptoms that started after completion of steroids therapy.  According to the patient, he developed significant cough 3 days ago with excessive sputum production.  Sputum is said to be yellowish.  No associated fever or chills.  No chest pain.  Patient has not had to use extra pillows and no orthopnea.  Patient has mild ankle edema that may be chronic.  Patient has chronically elevated BNP.  Chest x-ray did not show any acute process.  No leukocytosis.  COVID-19 testing is pending.  Hospitalist team has been asked to admit patient for further assessment and management.  No headache, and no neck pain, no GI symptoms or urinary symptoms.  ED Course: On presentation to the ER, temperature was 98.2, blood pressure of 130/97, heart rate of 93 with respiratory rate of 20 and O2 sat of 94%.  CBC revealed WBC of 7.1, hemoglobin of 12.6, platelet count of 229.  INR is 1.4.  Sodium is 135, potassium of 4, BUN of 12 with serum creatinine of 0.93.  Chest x-ray is nonrevealing.  BNP is 590.7.  Pertinent labs: As documented above.  EKG: Independently reviewed.  Atrial fibrillation noted.  Imaging: independently  reviewed.   Review of Systems:  Negative for fever, visual changes, sore throat, rash, new muscle aches, chest pain, dysuria, bleeding, n/v/abdominal pain.  Past Medical History:  Diagnosis Date  . Atrial fibrillation ([place])   . B12 deficiency   . 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 Cancer ([place])   . CHF (congestive heart failure) ([place]) 04/16/2013   Functional class II, ejection fraction 35-40%  . Chronic anticoagulation   . Chronic diastolic (congestive) heart failure (HCC) 04/16/2013   Functional class II, ejection fraction 35-40%  Last Assessment & Plan:  Clinically stable Volume well controlled meds reviewed  . Chronic diastolic CHF (congestive heart failure) ([place])   . Colon polyps 04/16/2013  . Diabetes mellitus without complication ([place])   . Diabetic neuropathy ([place])   . Essential hypertension 01/06/2010   Last Assessment & Plan:  Well controlled Continue med management  . 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  Steroid usage was discontinued several months ago.  Follow up with Dermatology as planned.  Marland Kitchen Heart murmur   . Hypercholesterolemia 01/06/2010   Last Assessment & Plan:  Repeat labs recommended  . Hyperlipidemia   . Hyperlipidemia associated with type 2 diabetes mellitus ([place]) 01/28/2018  . Hypertension   . Infection of prosthetic right knee joint ([place])   . 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 On amiodarone therapy 09/07/2013  . On continuous oral anticoagulation  01/28/2018  . Overweight (BMI 25.0-29.9) 10/22/2016  . Persistent atrial fibrillation ([place]) 04/16/2013   Last Assessment & Plan:  Rate controlled Continue med management eliquis for stroke preventino  Formatting of this note might be different from the original.  Drug  HX Current Rx Pre-ABL inefficacy Pre-ABL  intolerant Post-ABL inefficacy Post-ABL intolerant max dose/24h M/Y end comments  sotalol                  dofetilide                  flecainide                  propafenone                  am  . S/P TKR (total knee replacement), bilateral   . Tinea cruris 04/16/2013  . Type 2 diabetes mellitus with diabetic polyneuropathy, without long-term current use of insulin ([place]) 04/16/2013   Last Assessment & Plan:  Labs today Pt reports well controlled on ambulatory monitoring    Past Surgical History:  Procedure Laterality Date  . CARDIAC ELECTROPHYSIOLOGY STUDY AND ABLATION    . CARDIOVERSION    . CARDIOVERSION N/A 10/10/2018   Procedure: CARDIOVERSION;  Surgeon: [name], MD;  Location: [facility] ENDOSCOPY;  Service: Cardiovascular;  Laterality: N/A;  . CHOLECYSTECTOMY    . PROSTATECTOMY    . REPLACEMENT TOTAL KNEE BILATERAL       reports that he has never smoked. He has never used smokeless tobacco. He reports current alcohol use of about 2.0 standard drinks of alcohol per week. He reports that he does not use drugs.  No Known Allergies  Family History  Problem Relation Age of Onset  . Cancer Mother   . Depression Mother   . Early death Mother   . Cancer Father   . Depression Father   . Early death Father      Prior to Admission medications   Medication Sig Start Date End Date Taking? Authorizing Provider  albuterol (PROVENTIL) (2.5 MG/3ML) 0.083% nebulizer solution Take 3 mLs (2.5 mg total) by nebulization every 6 (six) hours as needed for wheezing or shortness of breath. 12/30/18  Yes Isaac Bliss, [name], MD  atorvastatin (LIPITOR) 20 MG tablet TAKE 1 TABLET DAILY AT 6 P.M. Patient taking differently: Take 20 mg by mouth daily.  12/30/18  Yes Isaac Bliss, [name], MD  diltiazem (CARDIZEM CD) 360 MG 24 hr capsule TAKE 1 CAPSULE DAILY Patient taking differently: Take 360 mg by mouth daily.  10/14/18  Yes Isaac Bliss, [name], MD  ELIQUIS 5 MG TABS tablet TAKE 1  TABLET TWICE A DAY Patient taking differently: Take 5 mg by mouth 2 (two) times daily.  12/30/18  Yes Isaac Bliss, [name], MD  furosemide (LASIX) 40 MG tablet TAKE 1 TABLET BY MOUTH TWICE A DAY Patient taking differently: Take 40 mg by mouth 2 (two) times daily.  12/29/18  Yes Richardo Priest, MD  gabapentin (NEURONTIN) 300 MG capsule Take 2 capsules (600 mg total) by mouth at bedtime. 09/23/18  Yes Isaac Bliss, [name], MD  lisinopril (ZESTRIL) 10 MG tablet Take 1 tablet (10 mg total) by mouth every evening. 11/25/18  Yes Isaac Bliss, [name], MD  metFORMIN (GLUCOPHAGE) 500 MG tablet Take 1 tablet (500 mg total) by mouth 2 (two) times daily with a meal. 07/25/18  Yes Isaac Bliss, [name], MD  metoprolol tartrate (LOPRESSOR) 50 MG  tablet Take 100 mg by mouth 2 (two) times daily.  01/20/18  Yes [provider]  mometasone (NASONEX) 50 MCG/ACT nasal spray Place 2 sprays into the nose 2 (two) times daily.    Yes [provider]  montelukast (SINGULAIR) 10 MG tablet Take 10 mg by mouth at bedtime.    Yes [provider]  glucose blood ([employer] TEST STRIPS) test strip Test 2x dailyDx: type 2 diabetes, uncontrolledfluctuating blood sugars 01/28/18   Isaac Bliss, [name], MD  glucose blood test strip Use to test blood sugars once daily as directed Dx:E11.65 01/09/16   [provider]  potassium chloride SA (K-DUR) 20 MEQ tablet Take 1 tablet (20 mEq total) by mouth daily. 10/07/18   Richardo Priest, MD    Physical Exam: Vitals:   01/05/19 1400 01/05/19 1430 01/05/19 1500 01/05/19 1633  BP: 118/80 125/75 123/73 (!) 130/97  Pulse: 93 93 96 93  Resp: 15 17 20 20   Temp:      TempSrc:      SpO2: 93% 92% 91% 94%  Weight:      Height:       Constitutional:  . Appears calm and comfortable Eyes:  . No pallor. No jaundice.  ENMT:  . external ears, nose appear normal Neck:  . Neck is supple. No JVD Respiratory:  . Decreased air entry with  mild expiratory wheeze.   Cardiovascular:  . S1S2, irregular . Mild ankle edema.     Abdomen:  . Abdomen is soft and non tender. Organs are difficult to assess. Neurologic:  . Awake and alert. . Moves all limbs.  Wt Readings from Last 3 Encounters:  01/05/19 82.6 kg  12/29/18 83 kg  12/19/18 87.8 kg    I have personally reviewed following labs and imaging studies  Labs on Admission:  CBC: Recent Labs  Lab 01/05/19 1242  WBC 7.1  NEUTROABS 4.0  HGB 12.6*  HCT 39.9  MCV 89.7  PLT [id number]   Basic Metabolic Panel: Recent Labs  Lab 01/05/19 1242  NA 135  K 4.0  CL 99  CO2 25  GLUCOSE 164*  BUN 12  CREATININE 0.93  CALCIUM 9.1   Liver Function Tests: Recent Labs  Lab 01/05/19 1242  AST 24  ALT 24  ALKPHOS 95  BILITOT 1.4*  PROT 6.9  ALBUMIN 4.0   No results for input(s): LIPASE, AMYLASE in the last 168 hours. No results for input(s): AMMONIA in the last 168 hours. Coagulation Profile: Recent Labs  Lab 01/05/19 1242  INR 1.4*   Cardiac Enzymes: No results for input(s): CKTOTAL, CKMB, CKMBINDEX, TROPONINI in the last 168 hours. BNP (last 3 results) Recent Labs    09/22/18 1107 10/06/18 0921 11/06/18 1005  PROBNP 289.0* 957* 1,062*   HbA1C: No results for input(s): HGBA1C in the last 72 hours. CBG: No results for input(s): GLUCAP in the last 168 hours. Lipid Profile: No results for input(s): CHOL, HDL, LDLCALC, TRIG, CHOLHDL, LDLDIRECT in the last 72 hours. Thyroid Function Tests: No results for input(s): TSH, T4TOTAL, FREET4, T3FREE, THYROIDAB in the last 72 hours. Anemia Panel: No results for input(s): VITAMINB12, FOLATE, FERRITIN, TIBC, IRON, RETICCTPCT in the last 72 hours. Urine analysis:    Component Value Date/Time   COLORURINE YELLOW 01/05/2019 [place] 01/05/2019 1643   LABSPEC 1.008 01/05/2019 1643   PHURINE 7.0 01/05/2019 1643   GLUCOSEU NEGATIVE 01/05/2019 1643   HGBUR NEGATIVE 01/05/2019 [place] 01/05/2019  [place] 01/05/2019 [place] 01/05/2019 1643   NITRITE NEGATIVE 01/05/2019 1643   LEUKOCYTESUR NEGATIVE 01/05/2019 1643   Sepsis Labs: @LABRCNTIP (procalcitonin:4,lacticidven:4) )No results found for this or any previous visit (from the past 240 hour(s)).    Radiological Exams on Admission: Dg Chest 2 View  Result Date: 01/05/2019 CLINICAL DATA:  Shortness of breath, weakness, COPD, cough EXAM: CHEST - 2 VIEW COMPARISON:  12/19/2018 FINDINGS: Upper normal heart size. Mediastinal contours and pulmonary vascularity normal. Atherosclerotic calcification aorta. Lungs clear. No acute infiltrate, pleural effusion or pneumothorax. Bones demineralized with scattered degenerative disc disease changes of the thoracic spine. IMPRESSION: No acute abnormalities. Electronically Signed   By: Lavonia Dana M.D.   On: 01/05/2019 13:47    EKG: Independently reviewed.   Active Problems:   Acute tracheobronchitis   Assessment/Plan Acute tracheobronchitis/reactive airway disease: -Place patient in observation. -Mucolytics -Steroids -Nebulizer treatment -Sputum culture -Low threshold to introduce antibiotics. -Further management will depend on hospital course.  Documented chronic diastolic CHF: -No typical syndrome of CHF reported. -However, cardiac BNP is elevated but chest x-ray has not shown any acute findings. -No worsening orthopnea.  Patient has not had to use extra pillows. -No worsening edema. -We will continue to assess.  Chronic atrial fibrillation: -Continue anticoagulation -Heart rate is controlled -Follow with EP team on discharge with planned ablation.  Diabetes mellitus type 2: -Sliding scale insulin coverage -Monitor closely  Hypertension: -Blood pressure ranges from 118/77 mmHg to 130/97 mmHg -Goal blood pressures less than 130/80 mmHg -Continue to monitor and optimize.   DVT prophylaxis: Apixaban Code Status: Full  code Family Communication:  Disposition Plan: Home eventually Consults called: None Admission status: Observation  Time spent: 65 minutes  Dana Allan, MD  [facility] Pager #: [phone] 7PM-7AM contact night coverage as above  01/05/2019, 5:00 PM

## 2019-01-05 NOTE — ED Provider Notes (Signed)
  Provider Note MRN:  [id number]  Arrival date & time: 01/05/19    ED Course and Medical Decision Making  Assumed care from Dr. Vallery Ridge at shift change.  Recurrence of shortness of breath, dyspnea on exertion, cough, favoring new onset heart failure, admitted to hospital service for further care.  Procedures  Final Clinical Impressions(s) / ED Diagnoses     ICD-10-CM   1. SOB (shortness of breath)  R06.02   2. Dyspnea on exertion  R06.00     ED Discharge Orders    None      Discharge Instructions   None     Barth Kirks. [place] Small, [place] [email or web address]    Maudie Flakes, MD 01/05/19 [phone]

## 2019-01-05 NOTE — Telephone Encounter (Signed)
Patient called stating he is cancelling his CT scan for today as he is not feeling well, and his on his way the to the ER.  He is also thing his ablation needs to be cancelled.

## 2019-01-05 NOTE — ED Triage Notes (Addendum)
Pt here for ongoing SOB post running out of "predisone at home"; recently admitted for SOB; see charting 12/17/18. Uses 2 lpm [place] at night per son.

## 2019-01-05 NOTE — Plan of Care (Signed)
Pt will verbalize treatment plan while hospitalized, verbalize the rationale

## 2019-01-05 NOTE — ED Provider Notes (Signed)
[facility] DEPT Provider Note   CSN: [id number] Arrival date & time: 01/05/19  1135     History   Chief Complaint Chief Complaint  Patient presents with  . Shortness of Breath    HPI Charles Lloyd. is a 78 y.o. male.     HPI Patient reports shortness of breath has not improved since his discharge from the hospital.  He is wearing his oxygen at night.  He reports he only had enough albuterol and prednisone for about a week.  He reports since he is run out symptoms are worse than ever.  He feels very short of breath if he lies flat.  He reports he has coughing paroxysms that produce a frothy white sputum.  He denies any blood-tinged sputum he reports sometimes the sputum is yellow and a little thicker.  He denies chest pain.  He denies increased lower extremity swelling.  I do note that he has trace edema around the ankles although he indicates that this has been a pretty chronic situation and does not feel like it is more than typical.  Patient reports symptoms are much exacerbated by lying flat.  He has to sleep propped up on several pillows.  He reports oftentimes he is waking up at the early hours of the morning very short of breath. Past Medical History:  Diagnosis Date  . Atrial fibrillation ([place])   . B12 deficiency   . 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 Cancer ([place])   . CHF (congestive heart failure) ([place]) 04/16/2013   Functional class II, ejection fraction 35-40%  . Chronic anticoagulation   . Chronic diastolic (congestive) heart failure (HCC) 04/16/2013   Functional class II, ejection fraction 35-40%  Last Assessment & Plan:  Clinically stable Volume well controlled meds reviewed  . Chronic diastolic CHF (congestive heart failure) ([place])   . Colon polyps 04/16/2013  . Diabetes mellitus without complication ([place])   . Diabetic neuropathy ([place])   . Essential hypertension 01/06/2010   Last Assessment & Plan:  Well  controlled Continue med management  . 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  Steroid usage was discontinued several months ago.  Follow up with Dermatology as planned.  Marland Kitchen Heart murmur   . Hypercholesterolemia 01/06/2010   Last Assessment & Plan:  Repeat labs recommended  . Hyperlipidemia   . Hyperlipidemia associated with type 2 diabetes mellitus ([place]) 01/28/2018  . Hypertension   . Infection of prosthetic right knee joint ([place])   . 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 On amiodarone therapy 09/07/2013  . On continuous oral anticoagulation 01/28/2018  . Overweight (BMI 25.0-29.9) 10/22/2016  . Persistent atrial fibrillation ([place]) 04/16/2013   Last Assessment & Plan:  Rate controlled Continue med management eliquis for stroke preventino  Formatting of this note might be different from the original.  Drug  HX Current Rx Pre-ABL inefficacy Pre-ABL intolerant Post-ABL inefficacy Post-ABL intolerant max dose/24h M/Y end comments  sotalol                  dofetilide                  flecainide  propafenone                  am  . S/P TKR (total knee replacement), bilateral   . Tinea cruris 04/16/2013  . Type 2 diabetes mellitus with diabetic polyneuropathy, without long-term current use of insulin ([place]) 04/16/2013   Last Assessment & Plan:  Labs today Pt reports well controlled on ambulatory monitoring    Patient Active Problem List   Diagnosis Date Noted  . Reactive airway disease with acute exacerbation   . Lactic acidosis   . Acute respiratory failure with hypoxia ([place]) 12/17/2018  . Hyponatremia 12/17/2018  . Community acquired pneumonia   . Vitamin D deficiency 07/25/2018  . Cardiomyopathy ([place]) 03/10/2018  . Chronic anticoagulation 03/06/2018  . Hyperlipidemia associated with type 2 diabetes  mellitus ([place]) 01/28/2018  . 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 Infected prosthetic knee joint ([place]) 06/24/2017  . B12 deficiency 10/22/2016  . Overweight (BMI 25.0-29.9) 10/22/2016  . 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 Persistent atrial fibrillation ([place]) 04/16/2013  . Chronic diastolic CHF (congestive heart failure) ([place]) 04/16/2013  . Colon polyps 04/16/2013  . Type 2 diabetes mellitus with diabetic polyneuropathy, without long-term current use of insulin ([place]) 04/16/2013  . Tinea cruris 04/16/2013  . 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 Diabetic neuropathy ([place]) 07/14/2012  . Insomnia 03/04/2012  . Hypertensive heart disease with heart failure ([place]) 01/06/2010  . Neoplasm of prostate, malignant ([place]) 01/06/2010  . Hypercholesterolemia 01/06/2010    Past Surgical History:  Procedure Laterality Date  . CARDIAC ELECTROPHYSIOLOGY STUDY AND ABLATION    . CARDIOVERSION    . CARDIOVERSION N/A 10/10/2018   Procedure: CARDIOVERSION;  Surgeon: [name], MD;  Location: [facility] ENDOSCOPY;  Service: Cardiovascular;  Laterality: N/A;  . CHOLECYSTECTOMY    . PROSTATECTOMY    . REPLACEMENT TOTAL KNEE BILATERAL          Home Medications    Prior to Admission medications   Medication Sig Start Date End Date Taking? Authorizing Provider  albuterol (PROVENTIL) (2.5 MG/3ML) 0.083% nebulizer solution Take 3 mLs (2.5 mg total) by nebulization every 6 (six) hours as needed for wheezing or shortness of breath. 12/30/18   Isaac Bliss, [name], MD  atorvastatin (LIPITOR) 20 MG tablet TAKE 1 TABLET DAILY AT 6 P.M. Patient taking differently: Take 20 mg by mouth daily.  12/30/18   Isaac Bliss, [name], MD  diltiazem (CARDIZEM CD) 360 MG 24 hr capsule TAKE 1 CAPSULE DAILY 10/14/18   Isaac Bliss, [name], MD  ELIQUIS 5 MG TABS tablet TAKE 1 TABLET TWICE A DAY 12/30/18   Isaac Bliss, [name], MD  furosemide (LASIX) 40 MG tablet TAKE 1 TABLET BY MOUTH TWICE A  DAY 12/29/18   Richardo Priest, MD  gabapentin (NEURONTIN) 300 MG capsule Take 2 capsules (600 mg total) by mouth at bedtime. 09/23/18   Isaac Bliss, [name], MD  glucose blood ([employer] TEST STRIPS) test strip Test 2x dailyDx: type 2 diabetes, uncontrolledfluctuating blood sugars 01/28/18   Isaac Bliss, [name], MD  glucose blood test strip Use to test blood sugars once daily as directed Dx:E11.65 01/09/16   [provider]  lisinopril (ZESTRIL) 10 MG tablet Take 1 tablet (10 mg total) by mouth every evening. 11/25/18   Isaac Bliss, [name], MD  metFORMIN (GLUCOPHAGE) 500 MG tablet Take 1 tablet (500 mg total) by mouth 2 (two) times daily with a meal. 07/25/18   Jerilee Hoh  Everardo Beals, MD  metoprolol tartrate (LOPRESSOR) 50 MG tablet Take 100 mg by mouth 2 (two) times daily.  01/20/18   [provider]  mometasone (NASONEX) 50 MCG/ACT nasal spray Place 2 sprays into the nose 2 (two) times daily.     [provider]  montelukast (SINGULAIR) 10 MG tablet Take 10 mg by mouth at bedtime.     [provider]  potassium chloride SA (K-DUR) 20 MEQ tablet Take 1 tablet (20 mEq total) by mouth daily. 10/07/18   Richardo Priest, MD    Family History Family History  Problem Relation Age of Onset  . Cancer Mother   . Depression Mother   . Early death Mother   . Cancer Father   . Depression Father   . Early death Father     Social History Social History   Tobacco Use  . Smoking status: Never Smoker  . Smokeless tobacco: Never Used  Substance Use Topics  . Alcohol use: Yes    Alcohol/week: 2.0 standard drinks    Types: 2 Glasses of wine per week    Comment: daily  . Drug use: Never     Allergies   Patient has no known allergies.   Review of Systems Review of Systems 10 Systems reviewed and are negative for acute change except as noted in the HPI.   Physical Exam Updated Vital Signs BP 123/73   Pulse 96   Temp 98.2 F (36.8 C)  (Oral)   Resp 20   Ht 5\' 10"  (1.778 m)   Wt 82.6 kg   SpO2 91%   BMI 26.11 kg/m   Physical Exam Constitutional:      Comments: Alert and appropriate.  Mild increased work of breathing at rest.  Occasional paroxysmal cough.  HENT:     Head: Normocephalic and atraumatic.  Neck:     Musculoskeletal: Neck supple.  Cardiovascular:     Comments: Irregularly irregular.  No gross rub murmur gallop.  Rate controlled. Pulmonary:     Comments: Mild increased work of breathing at rest.  Occasional cough paroxysmal with deep inspiration.  Fine expiratory wheeze throughout both lower lung fields. Abdominal:     General: There is no distension.     Palpations: Abdomen is soft.     Tenderness: There is no abdominal tenderness. There is no guarding.  Musculoskeletal:     Comments: Calves are soft and nontender.  Trace pitting edema bilateral ankles.  Condition of feet is very good.  No wounds or erythema.  No significant swelling of the feet.  Skin:    General: Skin is warm and dry.  Neurological:     General: No focal deficit present.     Mental Status: He is oriented to person, place, and time.     Coordination: Coordination normal.  Psychiatric:        Mood and Affect: Mood normal.      ED Treatments / Results  Labs (all labs ordered are listed, but only abnormal results are displayed) Labs Reviewed  COMPREHENSIVE METABOLIC PANEL - Abnormal; Notable for the following components:      Result Value   Glucose, Bld 164 (*)    Total Bilirubin 1.4 (*)    All other components within normal limits  BRAIN NATRIURETIC PEPTIDE - Abnormal; Notable for the following components:   B Natriuretic Peptide 590.7 (*)    All other components within normal limits  CBC WITH DIFFERENTIAL/PLATELET - Abnormal; Notable for the following  components:   Hemoglobin 12.6 (*)    RDW 16.2 (*)    Eosinophils Absolute 1.7 (*)    All other components within normal limits  PROTIME-INR - Abnormal; Notable for the  following components:   Prothrombin Time 16.9 (*)    INR 1.4 (*)    All other components within normal limits  BLOOD GAS, VENOUS - Abnormal; Notable for the following components:   pCO2, Ven 43.5 (*)    Acid-Base Excess 3.0 (*)    All other components within normal limits  CULTURE, BLOOD (ROUTINE X 2)  CULTURE, BLOOD (ROUTINE X 2)  SARS CORONAVIRUS 2 (TAT 6-24 HRS)  LACTIC ACID, PLASMA  LACTIC ACID, PLASMA  URINALYSIS, ROUTINE W REFLEX MICROSCOPIC  TROPONIN I (HIGH SENSITIVITY)  TROPONIN I (HIGH SENSITIVITY)    EKG EKG Interpretation  Date/Time:  Monday January 05 2019 12:58:12 EST Ventricular Rate:  86 PR Interval:    QRS Duration: 112 QT Interval:  377 QTC Calculation: 451 R Axis:   84 Text Interpretation: Atrial fibrillation Borderline intraventricular conduction delay Low voltage, extremity leads Baseline wander in lead(s) V5 no change from old Confirmed by Charlesetta Shanks [id number]) on 01/05/2019 1:39:43 PM   Radiology Dg Chest 2 View  Result Date: 01/05/2019 CLINICAL DATA:  Shortness of breath, weakness, COPD, cough EXAM: CHEST - 2 VIEW COMPARISON:  12/19/2018 FINDINGS: Upper normal heart size. Mediastinal contours and pulmonary vascularity normal. Atherosclerotic calcification aorta. Lungs clear. No acute infiltrate, pleural effusion or pneumothorax. Bones demineralized with scattered degenerative disc disease changes of the thoracic spine. IMPRESSION: No acute abnormalities. Electronically Signed   By: Lavonia Dana M.D.   On: 01/05/2019 13:47    Procedures Procedures (including critical care time) CRITICAL CARE Performed by: Charlesetta Shanks   Total critical care time: 30 minutes  Critical care time was exclusive of separately billable procedures and treating other patients.  Critical care was necessary to treat or prevent imminent or life-threatening deterioration.  Critical care was time spent personally by me on the following activities: development of treatment  plan with patient and/or surrogate as well as nursing, discussions with consultants, evaluation of patient's response to treatment, examination of patient, obtaining history from patient or surrogate, ordering and performing treatments and interventions, ordering and review of laboratory studies, ordering and review of radiographic studies, pulse oximetry and re-evaluation of patient's condition. Medications Ordered in ED Medications  albuterol (VENTOLIN HFA) 108 (90 Base) MCG/ACT inhaler 2 puff (has no administration in time range)  methylPREDNISolone sodium succinate (SOLU-MEDROL) 125 mg/2 mL injection 125 mg (has no administration in time range)  furosemide (LASIX) injection 40 mg (has no administration in time range)     Initial Impression / Assessment and Plan / ED Course  I have reviewed the triage vital signs and the nursing notes.  Pertinent labs & imaging results that were available during my care of the patient were reviewed by me and considered in my medical decision making (see chart for details).       Patient's dyspnea has been worsening since discharge.  He also describes orthopnea.  He has had cough that has been productive of a white frothy sputum.  He denies it is blood-tinged.  On exam, patient does have slight edema about the ankles.  His BNP has elevated.  He does have wheezing on physical exam.  Patient reports that all of the symptoms have really become pronounced in the past month since his first hospitalization.  He is a non-smoker and prior to  that did not have any problems with shortness of breath and was active.  Patient has chronic atrial fibrillation.  He was scheduled to get a elective cardioversion this week.  I have suspicion for congestive heart failure as possible underlying contributor to the patient's symptoms.  Patient's last echocardiogram was in August.  At this time, I have initiated treatment with 2 puffs of albuterol open (repeat Covid testing is pending),  Solu-Medrol IV and a dose of Lasix.  Plan will be for admission for dyspnea and hypoxia with a bronchospastic component.  Final Clinical Impressions(s) / ED Diagnoses   Final diagnoses:  SOB (shortness of breath)  Dyspnea on exertion  Chronic atrial fibrillation ([place])  Orthopnea    ED Discharge Orders    None       Charlesetta Shanks, MD 01/05/19 1600

## 2019-01-05 NOTE — ED Notes (Signed)
Patient transported to X-ray 

## 2019-01-05 NOTE — Progress Notes (Signed)
Pt admitted to room 1410 with tracheobronchitis and CHF, A/o x 4, bed and call light system explained, hob elevated, denies SOB and cough, when he does cough he states it yellow/green, pt remains afebrile, gait steady, urinal at bedside.

## 2019-01-06 ENCOUNTER — Other Ambulatory Visit (HOSPITAL_COMMUNITY): Payer: Medicare Other

## 2019-01-06 ENCOUNTER — Encounter (HOSPITAL_COMMUNITY): Payer: Self-pay | Admitting: Cardiology

## 2019-01-06 DIAGNOSIS — R0982 Postnasal drip: Secondary | ICD-10-CM

## 2019-01-06 DIAGNOSIS — J209 Acute bronchitis, unspecified: Secondary | ICD-10-CM | POA: Diagnosis not present

## 2019-01-06 DIAGNOSIS — R0601 Orthopnea: Secondary | ICD-10-CM | POA: Diagnosis not present

## 2019-01-06 DIAGNOSIS — R000 Tachycardia, unspecified: Secondary | ICD-10-CM | POA: Diagnosis not present

## 2019-01-06 DIAGNOSIS — I4819 Other persistent atrial fibrillation: Secondary | ICD-10-CM | POA: Diagnosis not present

## 2019-01-06 LAB — CBC
HCT: 35.8 % — ABNORMAL LOW (ref 39.0–52.0)
Hemoglobin: 11.4 g/dL — ABNORMAL LOW (ref 13.0–17.0)
MCH: 28.8 pg (ref 26.0–34.0)
MCHC: 31.8 g/dL (ref 30.0–36.0)
MCV: 90.4 fL (ref 80.0–100.0)
Platelets: 217 10*3/uL (ref 150–400)
RBC: 3.96 MIL/uL — ABNORMAL LOW (ref 4.22–5.81)
RDW: 16.3 % — ABNORMAL HIGH (ref 11.5–15.5)
WBC: 3.5 10*3/uL — ABNORMAL LOW (ref 4.0–10.5)
nRBC: 0 % (ref 0.0–0.2)

## 2019-01-06 LAB — COMPREHENSIVE METABOLIC PANEL
ALT: 21 U/L (ref 0–44)
AST: 19 U/L (ref 15–41)
Albumin: 3.5 g/dL (ref 3.5–5.0)
Alkaline Phosphatase: 84 U/L (ref 38–126)
Anion gap: 12 (ref 5–15)
BUN: 18 mg/dL (ref 8–23)
CO2: 21 mmol/L — ABNORMAL LOW (ref 22–32)
Calcium: 8.6 mg/dL — ABNORMAL LOW (ref 8.9–10.3)
Chloride: 99 mmol/L (ref 98–111)
Creatinine, Ser: 1.04 mg/dL (ref 0.61–1.24)
GFR calc Af Amer: >60 mL/min (ref >60)
GFR calc non Af Amer: >60 mL/min (ref >60)
Glucose, Bld: 234 mg/dL — ABNORMAL HIGH (ref 70–99)
Potassium: 4.5 mmol/L (ref 3.5–5.1)
Sodium: 132 mmol/L — ABNORMAL LOW (ref 135–145)
Total Bilirubin: 1.1 mg/dL (ref 0.3–1.2)
Total Protein: 6 g/dL — ABNORMAL LOW (ref 6.5–8.1)

## 2019-01-06 LAB — PROCALCITONIN: Procalcitonin: <0.1 ng/mL

## 2019-01-06 LAB — PHOSPHORUS: Phosphorus: 3.2 mg/dL (ref 2.5–4.6)

## 2019-01-06 LAB — MAGNESIUM: Magnesium: 1.8 mg/dL (ref 1.7–2.4)

## 2019-01-06 LAB — GLUCOSE, CAPILLARY
Glucose-Capillary: 254 mg/dL — ABNORMAL HIGH (ref 70–99)
Glucose-Capillary: 294 mg/dL — ABNORMAL HIGH (ref 70–99)

## 2019-01-06 MED ORDER — LORATADINE 10 MG PO TABS
10.0000 mg | ORAL_TABLET | Freq: Every day | ORAL | Status: DC
  Administered 2019-01-06: 10 mg via ORAL

## 2019-01-06 MED ORDER — LEVALBUTEROL HCL 0.63 MG/3ML IN NEBU: 0.6300 mg | INHALATION_SOLUTION | Freq: Four times a day (QID) | RESPIRATORY_TRACT | Status: DC | PRN

## 2019-01-06 MED ORDER — LORATADINE 10 MG PO TABS: 10.0000 mg | ORAL_TABLET | Freq: Every day | ORAL | 0 refills | Status: DC

## 2019-01-06 MED ORDER — PREDNISONE 10 MG PO TABS: ORAL_TABLET | ORAL | 0 refills | Status: DC

## 2019-01-06 MED ORDER — GUAIFENESIN ER 600 MG PO TB12: 600.0000 mg | ORAL_TABLET | Freq: Two times a day (BID) | ORAL | 0 refills | Status: AC | PRN

## 2019-01-06 MED ORDER — LEVALBUTEROL HCL 0.63 MG/3ML IN NEBU: 0.6300 mg | INHALATION_SOLUTION | Freq: Three times a day (TID) | RESPIRATORY_TRACT | 2 refills | Status: DC | PRN

## 2019-01-06 MED ORDER — IPRATROPIUM-ALBUTEROL 0.5-2.5 (3) MG/3ML IN SOLN
3.0000 mL | Freq: Two times a day (BID) | RESPIRATORY_TRACT | Status: DC
  Administered 2019-01-06: 3 mL via RESPIRATORY_TRACT
  Filled 2019-01-06: qty 3

## 2019-01-06 MED ORDER — FLUTICASONE PROPIONATE 50 MCG/ACT NA SUSP: 2.0000 | Freq: Every day | NASAL | 2 refills | Status: AC

## 2019-01-06 NOTE — Consult Note (Signed)
Cardiology Consultation:   Patient ID: Charles Lloyd. MRN: [id number]; DOB: August 22, 1940  Admit date: 01/05/2019 Date of Consult: 01/06/2019  Primary Care Provider: Isaac Bliss, [name], MD Primary Cardiologist: Dr Bettina Gavia Primary Electrophysiologist:  Dr Curt Bears   Patient Profile:   Charles Lloyd. is a 78 y.o. male with a hx of persistent atrial fibrillation, diabetes mellitus, hypertension, hyperlipidemia, chronic diastolic congestive heart failure who is being seen today for the evaluation of atrial fibrillation with rapid ventricular response at the request of Dana Allan MD.  History of Present Illness:   Echocardiogram August 2020 showed normal LV function.  Patient has been seen by Dr. Curt Bears in atrial fibrillation ablation has been scheduled.  He has been admitted with tracheobronchitis which has improved.  Today he was noted to be tachycardic with heart rates in the 140s and cardiology asked to evaluate.  Patient denies dyspnea at present.  No chest pain, palpitations or syncope.  He has had problems with a productive cough recently.  Heart Pathway Score:     Past Medical History:  Diagnosis Date  . B12 deficiency   . 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 Cancer ([place])   . Chronic anticoagulation   . Chronic diastolic (congestive) heart failure (HCC) 04/16/2013   Functional class II, ejection fraction 35-40%  Last Assessment & Plan:  Clinically stable Volume well controlled meds reviewed  . Colon polyps 04/16/2013  . Diabetic neuropathy ([place])   . Essential hypertension 01/06/2010   Last Assessment & Plan:  Well controlled Continue med management  . 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  Steroid usage was discontinued several months ago.  Follow up with Dermatology as planned.  . Hyperlipidemia associated with type 2 diabetes mellitus ([place]) 01/28/2018  . Infection of prosthetic  right knee joint ([place])   . 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 On amiodarone therapy 09/07/2013  . On continuous oral anticoagulation 01/28/2018  . Overweight (BMI 25.0-29.9) 10/22/2016  . Persistent atrial fibrillation ([place]) 04/16/2013   Last Assessment & Plan:  Rate controlled Continue med management eliquis for stroke preventino  Formatting of this note might be different from the original.  Drug  HX Current Rx Pre-ABL inefficacy Pre-ABL intolerant Post-ABL inefficacy Post-ABL intolerant max dose/24h M/Y end comments  sotalol                  dofetilide                  flecainide                  propafenone                  am  . S/P TKR (total knee replacement), bilateral   . Tinea cruris 04/16/2013  . Type 2 diabetes mellitus with diabetic polyneuropathy, without long-term current use of insulin ([place]) 04/16/2013   Last Assessment & Plan:  Labs today Pt reports well controlled on ambulatory monitoring    Past Surgical History:  Procedure Laterality Date  . CARDIAC ELECTROPHYSIOLOGY STUDY AND ABLATION    . CARDIOVERSION    . CARDIOVERSION N/A 10/10/2018   Procedure: CARDIOVERSION;  Surgeon: [name], MD;  Location: [facility];  Service: Cardiovascular;  Laterality: N/A;  . CHOLECYSTECTOMY    . PROSTATECTOMY    . REPLACEMENT TOTAL KNEE BILATERAL       Inpatient Medications: Scheduled Meds: . amoxicillin-clavulanate  1 tablet Oral Q12H  . apixaban  5 mg Oral BID  . atorvastatin  20 mg Oral Daily  . budesonide (PULMICORT) nebulizer solution  0.25 mg Nebulization BID  . diltiazem  360 mg Oral Daily  . fluticasone  2 spray Each Nare Daily  . furosemide  40 mg Intravenous BID  . gabapentin  600 mg Oral QHS  . guaiFENesin  600 mg Oral BID  . insulin aspart  0-5 Units Subcutaneous QHS  . insulin aspart  0-9 Units Subcutaneous TID WC  . lisinopril  10 mg  Oral QPM  . loratadine  10 mg Oral Daily  . metoprolol tartrate  100 mg Oral BID  . montelukast  10 mg Oral QHS  . potassium chloride SA  20 mEq Oral Daily  . predniSONE  60 mg Oral Q breakfast   Continuous Infusions:  PRN Meds: levalbuterol  Allergies:   No Known Allergies  Social History:   Social History   Socioeconomic History  . Marital status: Widowed    Spouse name: Not on file  . Number of children: Not on file  . Years of education: Not on file  . Highest education level: Not on file  Occupational History  . Not on file  Social Needs  . Financial resource strain: Not on file  . Food insecurity    Worry: Not on file    Inability: Not on file  . Transportation needs    Medical: Not on file    Non-medical: Not on file  Tobacco Use  . Smoking status: Never Smoker  . Smokeless tobacco: Never Used  Substance and Sexual Activity  . Alcohol use: Yes    Alcohol/week: 2.0 standard drinks    Types: 2 Glasses of wine per week    Comment: daily  . Drug use: Never  . Sexual activity: Not Currently  Lifestyle  . Physical activity    Days per week: Not on file    Minutes per session: Not on file  . Stress: Not on file  Relationships  . Social Herbalist on phone: Not on file    Gets together: Not on file    Attends religious service: Not on file    Active member of club or organization: Not on file    Attends meetings of clubs or organizations: Not on file    Relationship status: Not on file  . Intimate partner violence    Fear of current or ex partner: Not on file    Emotionally abused: Not on file    Physically abused: Not on file    Forced sexual activity: Not on file  Other Topics Concern  . Not on file  Social History Narrative  . Not on file    Family History:    Family History  Problem Relation Age of Onset  . Cancer Mother   . Depression Mother   . Early death Mother   . Cancer Father   . Depression Father   . Early death Father       ROS:  Please see the history of present illness.  Patient with recent productive cough of yellowish sputum but denies fevers, chills. All other ROS reviewed and negative.     Physical Exam/Data:   Vitals:   01/06/19 0107 01/06/19  0541 01/06/19 0900 01/06/19 1005  BP:  117/83  124/71  Pulse:  90  (!) 140  Resp:  16  18  Temp:  97.8 F (36.6 C)    TempSrc:  Oral    SpO2: 96% 95% 95% 95%  Weight:      Height:        Intake/Output Summary (Last 24 hours) at 01/06/2019 1101 Last data filed at 01/06/2019 1000 Gross per 24 hour  Intake 240 ml  Output 1120 ml  Net -880 ml   Last 3 Weights 01/05/2019 12/29/2018 12/19/2018  Weight (lbs) 182 lb 183 lb 193 lb 9 oz  Weight (kg) 82.555 kg 83.008 kg 87.8 kg     Body mass index is 26.11 kg/m.  General:  Well nourished, well developed, in no acute distress HEENT: normal Neck: no JVD Endocrine:  No thryomegaly Vascular: No carotid bruits; FA pulses 2+ bilaterally without bruits  Cardiac: Irregular and tachycardic Lungs:  clear to auscultation bilaterally, no wheezing, rhonchi or rales  Abd: soft, nontender, no hepatomegaly  Ext: no edema Musculoskeletal:  No deformities, BUE and BLE strength normal and equal Skin: warm and dry  Neuro:  CNs 2-12 intact, no focal abnormalities noted Psych:  Normal affect   EKG:  The EKG was personally reviewed and demonstrates: Atrial fibrillation with rapid ventricular response, PVC, nonspecific ST changes. Telemetry:  Telemetry was personally reviewed and demonstrates: Atrial fibrillation with elevated rate   Laboratory Data:  High Sensitivity Troponin:   Recent Labs  Lab 12/17/18 1151 12/17/18 1433 01/05/19 1242 01/05/19 1519  TROPONINIHS 4 3 3 3      Chemistry Recent Labs  Lab 01/05/19 1242 01/06/19 0418  NA 135 132*  K 4.0 4.5  CL 99 99  CO2 25 21*  GLUCOSE 164* 234*  BUN 12 18  CREATININE 0.93 1.04  CALCIUM 9.1 8.6*  GFRNONAA >60 >60  GFRAA >60 >60  ANIONGAP 11 12     Recent Labs  Lab 01/05/19 1242 01/06/19 0418  PROT 6.9 6.0*  ALBUMIN 4.0 3.5  AST 24 19  ALT 24 21  ALKPHOS 95 84  BILITOT 1.4* 1.1   Hematology Recent Labs  Lab 01/05/19 1242 01/06/19 0418  WBC 7.1 3.5*  RBC 4.45 3.96*  HGB 12.6* 11.4*  HCT 39.9 35.8*  MCV 89.7 90.4  MCH 28.3 28.8  MCHC 31.6 31.8  RDW 16.2* 16.3*  PLT 229 217   BNP Recent Labs  Lab 01/05/19 1242  BNP 590.7*     Radiology/Studies:  Dg Chest 2 View  Result Date: 01/05/2019 CLINICAL DATA:  Shortness of breath, weakness, COPD, cough EXAM: CHEST - 2 VIEW COMPARISON:  12/19/2018 FINDINGS: Upper normal heart size. Mediastinal contours and pulmonary vascularity normal. Atherosclerotic calcification aorta. Lungs clear. No acute infiltrate, pleural effusion or pneumothorax. Bones demineralized with scattered degenerative disc disease changes of the thoracic spine. IMPRESSION: No acute abnormalities. Electronically Signed   By: Lavonia Dana M.D.   On: 01/05/2019 13:47    Assessment and Plan:   1. Persistent atrial fibrillation-patient remains in atrial fibrillation.  His heart rate is elevated.  However he has not received his a.m. medications until 30 minutes prior to evaluation and states when he does not get his metoprolol on time he becomes tachycardic.  He denies any increased symptoms of dyspnea, chest pain or palpitations.  Also note he recently received an albuterol treatment.  Would continue Cardizem and metoprolol at present dose.  Follow heart rate for several hours later  today and if controlled patient may be discharged and follow-up with Dr. Curt Bears to arrange atrial fibrillation ablation.  Continue apixaban (CHADSvasc 4). 2. Tracheobronchitis-continue antibiotics and steroids per primary care. 3. Chronic diastolic congestive heart failure-would resume preadmission dose of Lasix at discharge. 4. Hypertension-continue preadmission medications.  For questions or updates, please contact [place]  Please consult [email or web address] for contact info under     Signed, Kirk Ruths, MD  01/06/2019 11:01 AM

## 2019-01-06 NOTE — Progress Notes (Signed)
Discharge instructions reviewed. HF packet provided. Questions concerns denied. No changes from am assessment.

## 2019-01-06 NOTE — Discharge Summary (Signed)
Physician Discharge Summary  Patient ID: Charles Lloyd. MRN: [id number] DOB/AGE: Nov 06, 1940 78 y.o.  Admit date: 01/05/2019 Discharge date: 01/06/2019  Admission Diagnoses:  Discharge Diagnoses:  Active Problems:   Postnasal drip syndrome (upper respiratory cough syndrome).   Possible acute tracheobronchitis   Chronic diastolic congestive heart failure   Permanent atrial fibrillation   Diabetes mellitus type 2   Hypertension    Discharged Condition: stable  Hospital Course:  Patient is a 78 year old Caucasian male with past medical history significant for hypertension, hyperlipidemia, diabetes mellitus type 2, chronic atrial fibrillation on Eliquis (currently awaiting ablation), chronic diastolic congestive heart failure and recent inpatient care for reactive airway disease, acute hypoxic respiratory failure, hyponatremia and volume depletion.  Patient was discharged from the hospital on 12/19/2018 on supplemental oxygen and tapering dose of oral steroids.  Patient was readmitted on 01/05/2019 with upper respiratory cough syndrome, possible acute tracheobronchitis and ER provider concern for possible acute on chronic diastolic congestive heart failure.  Chest x-ray came back normal.  No worsening orthopnea or paroxysmal nocturnal dyspnea.  No worsening leg edema.  Patient's presenting symptoms were largely suggestive of upper respiratory cough syndrome.  Patient also had mild expiratory wheeze.  Patient was managed with Flonase, oral steroids, nebulizer treatment and antibiotics.  Procalcitonin was less than 0.1.  Chest x-ray did not reveal any acute findings.  Patient is back to baseline and will be discharged back on today.  Patient developed RVR with nebulizer treatment.  Patient has nebulizer treatment will be changed to Xopenex as needed only.  RVR was discussed with cardiology team, and it was felt to be medication related.  Heart rate is down to the 80s.  Patient will be discharged to  the care of the primary care provider, electrophysiology team and the allergist.  Consults: cardiology (discussed with cardiology team prior to discharge.  Cardiology has cleared patient for discharge.  Patient will follow with the electrophysiology team on discharge.)  Significant Diagnostic Studies: Chest x-ray did not reveal any acute findings.  Procalcitonin was less than 0.1.  Discharge Exam: Blood pressure 124/71, pulse (!) 140, temperature 97.8 F (36.6 C), temperature source Oral, resp. rate 18, height 5\' 10"  (1.778 m), weight 82.6 kg, SpO2 95 %.  Disposition: Discharge disposition: 01-Home or Self Care  Discharge Instructions    Diet - low sodium heart healthy   Complete by: As directed    Increase activity slowly   Complete by: As directed      Allergies as of 01/06/2019   No Known Allergies     Medication List    STOP taking these medications   albuterol (2.5 MG/3ML) 0.083% nebulizer solution Commonly known as: PROVENTIL   mometasone 50 MCG/ACT nasal spray Commonly known as: NASONEX     TAKE these medications   atorvastatin 20 MG tablet Commonly known as: LIPITOR TAKE 1 TABLET DAILY AT 6 P.M. What changed: See the new instructions.   diltiazem 360 MG 24 hr capsule Commonly known as: CARDIZEM CD TAKE 1 CAPSULE DAILY   Eliquis 5 MG Tabs tablet Generic drug: apixaban TAKE 1 TABLET TWICE A DAY What changed: how much to take   fluticasone 50 MCG/ACT nasal spray Commonly known as: FLONASE Place 2 sprays into both nostrils daily. Start taking on: January 07, 2019   furosemide 40 MG tablet Commonly known as: LASIX TAKE 1 TABLET BY MOUTH TWICE A DAY What changed: when to take this   gabapentin 300 MG capsule Commonly known as: NEURONTIN  Take 2 capsules (600 mg total) by mouth at bedtime.   glucose blood test strip Use to test blood sugars once daily as directed Dx:E11.65   glucose blood test strip Commonly known as: KROG[employer] TEST STRIPS Test 2x  dailyDx: type 2 diabetes, uncontrolledfluctuating blood sugars   guaiFENesin 600 MG 12 hr tablet Commonly known as: MUCINEX Take 1 tablet (600 mg total) by mouth 2 (two) times daily as needed for up to 10 days.   levalbuterol 0.63 MG/3ML nebulizer solution Commonly known as: XOPENEX Take 3 mLs (0.63 mg total) by nebulization every 8 (eight) hours as needed for up to 10 days for wheezing or shortness of breath.   lisinopril 10 MG tablet Commonly known as: ZESTRIL Take 1 tablet (10 mg total) by mouth every evening.   loratadine 10 MG tablet Commonly known as: CLARITIN Take 1 tablet (10 mg total) by mouth daily.   metFORMIN 500 MG tablet Commonly known as: GLUCOPHAGE Take 1 tablet (500 mg total) by mouth 2 (two) times daily with a meal.   metoprolol tartrate 50 MG tablet Commonly known as: LOPRESSOR Take 100 mg by mouth 2 (two) times daily.   montelukast 10 MG tablet Commonly known as: SINGULAIR Take 10 mg by mouth at bedtime.   potassium chloride SA 20 MEQ tablet Commonly known as: KLOR-CON Take 1 tablet (20 mEq total) by mouth daily.   predniSONE 10 MG tablet Commonly known as: DELTASONE Prednisone 40 mg po daily for 3 days, then 30 Mg p.o. daily for 3 days, then 20 Mg p.o. daily for 3 days and 10 Mg p.o. daily for 3 days and stop.        SignedBonnell Public 01/06/2019, 12:28 PM

## 2019-01-06 NOTE — Progress Notes (Signed)
RN contacted by CMT regarding increased HR 140-170 sustained AFIB. VS assessed, 140,18, 124/70, 96 % ra. Pt reports feeling well, denies CP, SOB weakness and or dizziness. Morning medications administered. Will continue to monitor,and reassess, provider updated.

## 2019-01-07 ENCOUNTER — Telehealth: Payer: Self-pay | Admitting: *Deleted

## 2019-01-07 NOTE — Telephone Encounter (Signed)
FYI  Called patient to schedule a TCM appointment.  Patient declines at this time.  States "he did that not long ago, and it's not necessary".

## 2019-01-08 NOTE — Telephone Encounter (Signed)
Spoke to pt yesterday afternoon.  Pt scheduled 12/1 to see Dr. Curt Bears to discuss re-scheduling ablation.  Pt informed that Dr. Curt Bears would prefer to see/evaluate him in the office before deciding is safe/ok to proceed w/ rescheduling ablation.   Patient verbalized understanding and agreeable to plan.

## 2019-01-09 ENCOUNTER — Ambulatory Visit (HOSPITAL_COMMUNITY): Admit: 2019-01-09 | Payer: Medicare Other | Admitting: Cardiology

## 2019-01-09 ENCOUNTER — Encounter (HOSPITAL_COMMUNITY): Payer: Self-pay

## 2019-01-09 LAB — HM DIABETES EYE EXAM

## 2019-01-09 SURGERY — ATRIAL FIBRILLATION ABLATION
Anesthesia: General

## 2019-01-10 ENCOUNTER — Other Ambulatory Visit: Payer: Self-pay | Admitting: Internal Medicine

## 2019-01-10 DIAGNOSIS — E1142 Type 2 diabetes mellitus with diabetic polyneuropathy: Secondary | ICD-10-CM

## 2019-01-10 LAB — CULTURE, BLOOD (ROUTINE X 2)
Culture: NO GROWTH
Culture: NO GROWTH
Special Requests: ADEQUATE
Special Requests: ADEQUATE

## 2019-01-12 ENCOUNTER — Other Ambulatory Visit: Payer: Self-pay | Admitting: Internal Medicine

## 2019-01-12 NOTE — Telephone Encounter (Signed)
Requested medication (s) are due for refill today: yes  Requested medication (s) are on the active medication list: yes  Last refill:  01/20/2018  Future visit scheduled: no  Notes to clinic:  Last filled by historical provider  Review for refill   Requested Prescriptions  Pending Prescriptions Disp Refills   metoprolol tartrate (LOPRESSOR) 50 MG tablet       Sig: Take 2 tablets (100 mg total) by mouth 2 (two) times daily.     Cardiovascular:  Beta Blockers Failed - 01/12/2019 10:15 AM      Failed - Last Heart Rate in normal range    Pulse Readings from Last 1 Encounters:  01/06/19 (!) 140         Passed - Last BP in normal range    BP Readings from Last 1 Encounters:  01/06/19 124/71         Passed - Valid encounter within last 6 months    Recent Outpatient Visits          1 week ago Hospital discharge follow-up   [place] at [facility], [name], MD   4 weeks ago Lower respiratory tract infection   Therapist, music at [place], MD   1 month ago Type 2 diabetes mellitus with diabetic polyneuropathy, without long-term current use of insulin ([place])   [place] at [facility], [name], MD   3 months ago SOB (shortness of breath)   Therapist, music at [facility], [name], MD   3 months ago SOB (shortness of breath)   Therapist, music at [place], MD      Future Appointments            In 2 weeks [place], Ocie Doyne, MD [place], [facility]

## 2019-01-12 NOTE — Telephone Encounter (Signed)
[id number] - [place], [place] (Phone) [phone] (Fax)   metoprolol tartrate (LOPRESSOR) 50 MG tablet

## 2019-01-13 MED ORDER — METOPROLOL TARTRATE 50 MG PO TABS: 100.0000 mg | ORAL_TABLET | Freq: Two times a day (BID) | ORAL | 1 refills | Status: DC

## 2019-01-19 ENCOUNTER — Other Ambulatory Visit: Payer: Self-pay | Admitting: Internal Medicine

## 2019-01-19 NOTE — Telephone Encounter (Signed)
Medication Refill - Medication: furosemide (LASIX) 40 MG tablet  Has the patient contacted their pharmacy? Yes.   (Agent: If no, request that the patient contact the pharmacy for the refill.) (Agent: If yes, when and what did the pharmacy advise?)  Preferred Pharmacy (with phone number or street name): [place], [place] [phone] (Phone) [phone] (Fax     Agent: Please be advised that RX refills may take up to 3 business days. We ask that you follow-up with your pharmacy.

## 2019-01-19 NOTE — Telephone Encounter (Signed)
Requested medication (s) are due for refill today: yes  Requested medication (s) are on the active medication list: yes  Last refill:  12/28/2018  Future visit scheduled: no  Notes to clinic: Patient requesting medication sent to a different pharmacy but it was filled by different provider  Review for refill   Requested Prescriptions  Pending Prescriptions Disp Refills   furosemide (LASIX) 40 MG tablet 180 tablet 1    Sig: Take 1 tablet (40 mg total) by mouth 2 (two) times daily.     Cardiovascular:  Diuretics - Loop Failed - 01/19/2019  9:21 AM      Failed - Ca in normal range and within 360 days    Calcium  Date Value Ref Range Status  01/06/2019 8.6 (L) 8.9 - 10.3 mg/dL Final         Failed - Na in normal range and within 360 days    Sodium  Date Value Ref Range Status  01/06/2019 132 (L) 135 - 145 mmol/L Final  12/25/2018 136 134 - 144 mmol/L Final         Passed - K in normal range and within 360 days    Potassium  Date Value Ref Range Status  01/06/2019 4.5 3.5 - 5.1 mmol/L Final         Passed - Cr in normal range and within 360 days    Creatinine, Ser  Date Value Ref Range Status  01/06/2019 1.04 0.61 - 1.24 mg/dL Final         Passed - Last BP in normal range    BP Readings from Last 1 Encounters:  01/06/19 124/71         Passed - Valid encounter within last 6 months    Recent Outpatient Visits          2 weeks ago Hospital discharge follow-up   [place] at [facility], [name], MD   1 month ago Lower respiratory tract infection   Therapist, music at [place], MD   1 month ago Type 2 diabetes mellitus with diabetic polyneuropathy, without long-term current use of insulin ([place])   [place] at [facility], [name], MD   3 months ago SOB (shortness of breath)   Therapist, music at [facility], [name], MD   3 months ago SOB (shortness of breath)   Forensic psychologist at [place], MD      Future Appointments            In 1 week [place], Ocie Doyne, MD [place], [facility]

## 2019-01-26 ENCOUNTER — Other Ambulatory Visit: Payer: Self-pay | Admitting: Cardiology

## 2019-01-26 ENCOUNTER — Other Ambulatory Visit: Payer: Self-pay | Admitting: Internal Medicine

## 2019-01-26 DIAGNOSIS — R0602 Shortness of breath: Secondary | ICD-10-CM

## 2019-01-26 MED ORDER — POTASSIUM CHLORIDE CRYS ER 20 MEQ PO TBCR: 20.0000 meq | EXTENDED_RELEASE_TABLET | Freq: Every day | ORAL | 0 refills | Status: DC

## 2019-01-26 NOTE — Telephone Encounter (Signed)
Requested medication (s) are due for refill today: yes  Requested medication (s) are on the active medication list: yes  Last refill:  12/29/2018  Future visit scheduled: no  Notes to clinic:  Last filled by different provider Review for refill   Requested Prescriptions  Pending Prescriptions Disp Refills   furosemide (LASIX) 40 MG tablet 180 tablet 1    Sig: Take 1 tablet (40 mg total) by mouth 2 (two) times daily.     Cardiovascular:  Diuretics - Loop Failed - 01/26/2019  9:08 AM      Failed - Ca in normal range and within 360 days    Calcium  Date Value Ref Range Status  01/06/2019 8.6 (L) 8.9 - 10.3 mg/dL Final         Failed - Na in normal range and within 360 days    Sodium  Date Value Ref Range Status  01/06/2019 132 (L) 135 - 145 mmol/L Final  12/25/2018 136 134 - 144 mmol/L Final         Passed - K in normal range and within 360 days    Potassium  Date Value Ref Range Status  01/06/2019 4.5 3.5 - 5.1 mmol/L Final         Passed - Cr in normal range and within 360 days    Creatinine, Ser  Date Value Ref Range Status  01/06/2019 1.04 0.61 - 1.24 mg/dL Final         Passed - Last BP in normal range    BP Readings from Last 1 Encounters:  01/06/19 124/71         Passed - Valid encounter within last 6 months    Recent Outpatient Visits          3 weeks ago Hospital discharge follow-up   [place] at [facility], [name], MD   1 month ago Lower respiratory tract infection   Therapist, music at [facility] [facility], Langley Adie, MD   2 months ago Type 2 diabetes mellitus with diabetic polyneuropathy, without long-term current use of insulin ([place])   [place] at [facility], [name], MD   3 months ago SOB (shortness of breath)   Therapist, music at [facility], [name], MD   4 months ago SOB (shortness of breath)   Therapist, music at [employer], Langley Adie, MD      Future  Appointments            Tomorrow Curt Bears, Ocie Doyne, MD [place], [facility]

## 2019-01-26 NOTE — Telephone Encounter (Signed)
Medication Refill - Medication: furosemide (LASIX) 40 MG tablet Pt requests 90 day supply.   Preferred Pharmacy:  [facility] - [place], [place]  [place] [address]  Phone: [phone] Fax: [phone]     Pt was advised that RX refills may take up to 3 business days. We ask that you follow-up with your pharmacy.

## 2019-01-26 NOTE — Telephone Encounter (Signed)
Rx sent to pharmacy as requested.

## 2019-01-26 NOTE — Telephone Encounter (Signed)
°*  STAT* If patient is at the pharmacy, call can be transferred to refill team.   1. Which medications need to be refilled? (please list name of each medication and dose if known) potassium chloride SA (K-DUR) 20 MEQ tablet  2. Which pharmacy/location (including street and city if local pharmacy) is medication to be sent to?  [id number] [place], [place] [phone] (Phone) [phone] (Fax)    3. Do they need a 30 day or 90 day supply?

## 2019-01-27 ENCOUNTER — Ambulatory Visit: Payer: Medicare Other | Admitting: Cardiology

## 2019-01-27 ENCOUNTER — Encounter: Payer: Self-pay | Admitting: Cardiology

## 2019-01-27 ENCOUNTER — Other Ambulatory Visit: Payer: Self-pay

## 2019-01-27 VITALS — BP 122/68 | HR 72 | Ht 70.0 in | Wt 189.6 lb

## 2019-01-27 DIAGNOSIS — I4819 Other persistent atrial fibrillation: Secondary | ICD-10-CM

## 2019-01-27 DIAGNOSIS — Z01812 Encounter for preprocedural laboratory examination: Secondary | ICD-10-CM | POA: Diagnosis not present

## 2019-01-27 NOTE — Progress Notes (Signed)
Electrophysiology Office Note   Date:  01/27/2019   ID:  Charles Douse., DOB Nov 22, 1940, MRN [id number]  PCP:  Isaac Bliss, [name], MD  Cardiologist:  Bettina Gavia Primary Electrophysiologist:  Mccormick Macon Meredith Leeds, MD    Chief Complaint: atrial fibrillation initial   History of Present Illness: Charles Lloyd. is a 78 y.o. male who is being seen today for the evaluation of atrial fibrillation at the request of Charles Lloyd. Presenting today for electrophysiology evaluation.  He has a history of atrial fibrillation, moderate systolic heart failure, diabetes, hypertension, hyperlipidemia.  He had an ablation in [place] in 2015 with a cardioversion in 2017 and then a repeat cardioversion September 2019.  He is currently on Eliquis and rate controlled with diltiazem and metoprolol.  He has been having significant nighttime shortness of breath where he has to sit on the edge of the bed.  His diuretics have been increased.  He had an attempt at cardioversion 10/10/2018 but unfortunately failed to convert.  Today, denies symptoms of palpitations, chest pain, shortness of breath, orthopnea, PND, lower extremity edema, claudication, dizziness, presyncope, syncope, bleeding, or neurologic sequela. The patient is tolerating medications without difficulties.  Overall he is doing well.  He was hospitalized for tracheobronchitis twice in the last month.  He has since recovered and is breathing normally.  He remains in atrial fibrillation.  He continues to want ablation.  He has weakness and fatigue and some mild shortness of breath.   Past Medical History:  Diagnosis Date  . B12 deficiency   . 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 Cancer ([place])   . Chronic anticoagulation   . Chronic diastolic (congestive) heart failure (HCC) 04/16/2013   Functional class II, ejection fraction 35-40%  Last Assessment & Plan:  Clinically stable Volume well controlled meds reviewed  . Colon  polyps 04/16/2013  . Diabetic neuropathy ([place])   . Essential hypertension 01/06/2010   Last Assessment & Plan:  Well controlled Continue med management  . 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  Steroid usage was discontinued several months ago.  Follow up with Dermatology as planned.  . Hyperlipidemia associated with type 2 diabetes mellitus ([place]) 01/28/2018  . Infection of prosthetic right knee joint ([place])   . 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 On amiodarone therapy 09/07/2013  . On continuous oral anticoagulation 01/28/2018  . Overweight (BMI 25.0-29.9) 10/22/2016  . Persistent atrial fibrillation ([place]) 04/16/2013   Last Assessment & Plan:  Rate controlled Continue med management eliquis for stroke preventino  Formatting of this note might be different from the original.  Drug  HX Current Rx Pre-ABL inefficacy Pre-ABL intolerant Post-ABL inefficacy Post-ABL intolerant max dose/24h M/Y end comments  sotalol                  dofetilide                  flecainide                  propafenone                  am  . S/P TKR (total knee replacement), bilateral   . Tinea cruris 04/16/2013  . Type 2 diabetes  mellitus with diabetic polyneuropathy, without long-term current use of insulin ([place]) 04/16/2013   Last Assessment & Plan:  Labs today Pt reports well controlled on ambulatory monitoring   Past Surgical History:  Procedure Laterality Date  . CARDIAC ELECTROPHYSIOLOGY STUDY AND ABLATION    . CARDIOVERSION    . CARDIOVERSION N/A 10/10/2018   Procedure: CARDIOVERSION;  Surgeon: [name], MD;  Location: [facility] ENDOSCOPY;  Service: Cardiovascular;  Laterality: N/A;  . CHOLECYSTECTOMY    . PROSTATECTOMY    . REPLACEMENT TOTAL KNEE BILATERAL       Current Outpatient Medications  Medication Sig Dispense Refill  .  atorvastatin (LIPITOR) 20 MG tablet TAKE 1 TABLET DAILY AT 6 P.M. (Patient taking differently: Take 20 mg by mouth daily. ) 90 tablet 1  . diltiazem (CARDIZEM CD) 360 MG 24 hr capsule TAKE 1 CAPSULE DAILY (Patient taking differently: Take 360 mg by mouth daily. ) 90 capsule 1  . ELIQUIS 5 MG TABS tablet TAKE 1 TABLET TWICE A DAY (Patient taking differently: Take 5 mg by mouth 2 (two) times daily. ) 180 tablet 3  . fluticasone (FLONASE) 50 MCG/ACT nasal spray Place 2 sprays into both nostrils daily. 16 g 2  . furosemide (LASIX) 40 MG tablet TAKE 1 TABLET BY MOUTH TWICE A DAY (Patient taking differently: Take 40 mg by mouth 2 (two) times daily. ) 180 tablet 1  . gabapentin (NEURONTIN) 300 MG capsule Take 2 capsules (600 mg total) by mouth at bedtime. 180 capsule 1  . glucose blood (KROGER TEST STRIPS) test strip Test 2x dailyDx: type 2 diabetes, uncontrolledfluctuating blood sugars 100 each 0  . glucose blood test strip Use to test blood sugars once daily as directed Dx:E11.65    . lisinopril (ZESTRIL) 10 MG tablet Take 1 tablet (10 mg total) by mouth every evening. 90 tablet 1  . loratadine (CLARITIN) 10 MG tablet Take 1 tablet (10 mg total) by mouth daily. 30 tablet 0  . metFORMIN (GLUCOPHAGE) 500 MG tablet TAKE 1 TABLET TWICE A DAY WITH MEALS 180 tablet 1  . metoprolol tartrate (LOPRESSOR) 50 MG tablet Take 2 tablets (100 mg total) by mouth 2 (two) times daily. 180 tablet 1  . montelukast (SINGULAIR) 10 MG tablet Take 10 mg by mouth at bedtime.     . potassium chloride SA (KLOR-CON) 20 MEQ tablet Take 1 tablet (20 mEq total) by mouth daily. 30 tablet 0  . predniSONE (DELTASONE) 10 MG tablet Prednisone 40 mg po daily for 3 days, then 30 Mg p.o. daily for 3 days, then 20 Mg p.o. daily for 3 days and 10 Mg p.o. daily for 3 days and stop. 30 tablet 0  . levalbuterol (XOPENEX) 0.63 MG/3ML nebulizer solution Take 3 mLs (0.63 mg total) by nebulization every 8 (eight) hours as needed for up to 10 days for  wheezing or shortness of breath. 3 mL 2   No current facility-administered medications for this visit.     Allergies:   Patient has no known allergies.   Social History:  The patient  reports that he has never smoked. He has never used smokeless tobacco. He reports current alcohol use of about 2.0 standard drinks of alcohol per week. He reports that he does not use drugs.   Family History:  The patient's family history includes Cancer in his father and mother; Depression in his father and mother; Early death in his father and mother.    ROS:  Please see the history of present  illness.   Otherwise, review of systems is positive for none.   All other systems are reviewed and negative.   PHYSICAL EXAM: VS:  BP 122/68   Pulse 72   Ht 5\' 10"  (1.778 m)   Wt 189 lb 9.6 oz (86 kg)   SpO2 93%   BMI 27.20 kg/m  , BMI Body mass index is 27.2 kg/m. GEN: Well nourished, well developed, in no acute distress  HEENT: normal  Neck: no JVD, carotid bruits, or masses Cardiac: RRR; no murmurs, rubs, or gallops,no edema  Respiratory:  clear to auscultation bilaterally, normal work of breathing GI: soft, nontender, nondistended, + BS MS: no deformity or atrophy  Skin: warm and dry Neuro:  Strength and sensation are intact Psych: euthymic mood, full affect  EKG:  EKG is not ordered today. Personal review of the ekg ordered 01/06/19 shows atrial fibrillation, rate 130  Recent Labs: 07/24/2018: TSH 0.87 11/06/2018: NT-Pro BNP 1,062 01/05/2019: B Natriuretic Peptide 590.7 01/06/2019: ALT 21; BUN 18; Creatinine, Ser 1.04; Hemoglobin 11.4; Magnesium 1.8; Platelets 217; Potassium 4.5; Sodium 132    Lipid Panel     Component Value Date/Time   CHOL 141 11/06/2018 1005   TRIG 68 11/06/2018 1005   HDL 45 11/06/2018 1005   CHOLHDL 3.1 11/06/2018 1005   CHOLHDL 3 07/24/2018 1058   VLDL 25.0 07/24/2018 1058   LDLCALC 82 11/06/2018 1005     Wt Readings from Last 3 Encounters:  01/27/19 189 lb 9.6  oz (86 kg)  01/05/19 182 lb (82.6 kg)  12/29/18 183 lb (83 kg)      Other studies Reviewed: Additional studies/ records that were reviewed today include: TTE 10/07/18  Review of the above records today demonstrates:   1. The left ventricle has low normal systolic function, with an ejection fraction of 50-55%. The cavity size was normal. Left ventricular diastolic function could not be evaluated secondary to atrial fibrillation. No evidence of left ventricular  regional wall motion abnormalities.  2. The right ventricle has normal systolic function. The cavity was normal. There is no increase in right ventricular wall thickness. Right ventricular systolic pressure is normal.  3. There is mild mitral annular calcification present.  4. The aortic valve is tricuspid. Moderate sclerosis of the aortic valve. Aortic valve regurgitation was not assessed by color flow Doppler.  5. The aorta is normal in size and structure.  6. There is right bowing of the interatrial septum, suggestive of elevated left atrial pressure.   ASSESSMENT AND PLAN:  1.  Persistent atrial fibrillation: Currently on Eliquis.  He failed to cardiovert 10/10/2018.  Unfortunately his ablation was canceled due to an episode of tracheobronchitis.  At this point, he continues to opt for ablation.  Risks were discussed which include bleeding, tamponade, heart block, stroke, damage surrounding organs.  He understands these risks and is agreed to the procedure.  This patients CHA2DS2-VASc Score and unadjusted Ischemic Stroke Rate (% per year) is equal to 4.8 % stroke rate/year from a score of 4  Above score calculated as 1 point each if present [CHF, HTN, DM, Vascular=MI/PAD/Aortic Plaque, Age if 65-74, or Male] Above score calculated as 2 points each if present [Age > 75, or Stroke/TIA/TE]   2.  Chronic systolic heart failure: No obvious signs of volume overload.  No changes.  3.  Hypertension: Currently well controlled  4.   Hyperlipidemia: Continue statin per primary cardiology    Current medicines are reviewed at length with the patient today.  The patient does not have concerns regarding his medicines.  The following changes were made today: None  Labs/ tests ordered today include:  Orders Placed This Encounter  Procedures  . CT CARDIAC MORPH/PULM VEIN W/CM&W/O CA SCORE  . CT CORONARY FRACTIONAL FLOW RESERVE DATA PREP  . CT CORONARY FRACTIONAL FLOW RESERVE FLUID ANALYSIS  . Basic metabolic panel  . CBC    Disposition:   FU with Quanah Majka 3 months  Signed, Johnryan Sao Meredith Leeds, MD  01/27/2019 9:30 AM     [facility] [address] [place] [address] [phone] (office) [phone] (fax)

## 2019-01-27 NOTE — Patient Instructions (Addendum)
Medication Instructions:  Your physician recommends that you continue on your current medications as directed. Please refer to the Current Medication list given to you today.  Labwork: Return for pre procedure lab work on 03/09/2019 - before you get your Covid screening.  If you have labs (blood work) drawn today and your tests are completely normal, you will receive your results only by:  Will (if you have MyChart) OR  A paper copy in the mail If you have any lab test that is abnormal or we need to change your treatment, we will call you to review the results.  Testing/Procedures: Your physician has requested that you have cardiac CT within 7 days PRIOR to your ablation. Cardiac computed tomography (CT) is a painless test that uses an x-ray machine to take clear, detailed pictures of your heart. For further information please visit [email or web address]. Please follow instructions below.  Your physician has recommended that you have an ablation. Catheter ablation is a medical procedure used to treat some cardiac arrhythmias (irregular heartbeats). During catheter ablation, a long, thin, flexible tube is put into a blood vessel in your groin (upper thigh), or neck. This tube is called an ablation catheter. It is then guided to your heart through the blood vessel. Radio frequency waves destroy small areas of heart tissue where abnormal heartbeats may cause an arrhythmia to start. Please see instructions below.     Follow-Up: You are scheduled for a phone visit with Dr. Curt Bears on 03/03/2019 @ 8:45 am  Your physician recommends that you schedule a follow-up appointment in: 4 weeks, after your procedure on 03/12/2019, with the AFib clinic.  Your physician recommends that you schedule a follow-up appointment in: 3 months, after your procedure on 03/12/2019, with Dr. Curt Bears.  * If you need a refill on your cardiac medications before your next appointment, please call your pharmacy.    *Please note that any paperwork needing to be filled out by the provider will need to be addressed at the front desk prior to seeing the provider. Please note that any FMLA, disability or other documents regarding health condition is subject to a $25.00 charge that must be received prior to completion of paperwork in the form of a money order or check.  Thank you for choosing [facility]!!   Trinidad Curet, RN [phone]  Any Other Special Instructions Will Be Listed Below (If Applicable).  CT INSTRUCTIONS Your cardiac CT will be scheduled at: [facility] [place], [place] [address] [phone]  If scheduled at [facility], please arrive at the [facility] main entrance of [facility] at ___________, 30-45 minutes prior to test start time. Proceed to the [facility] Radiology Department (first floor) to check-in and test prep.  Please follow these instructions carefully (unless otherwise directed):  Hold all erectile dysfunction medications at least 3 days (72 hrs) prior to test.  On the Night Before the Test: . Be sure to Drink plenty of water. . Do not consume any caffeinated/decaffeinated beverages or chocolate 12 hours prior to your test. . Do not take any antihistamines 12 hours prior to your test. . Hold Metformin 24 hour prior to this testing  On the Day of the Test: . Drink plenty of water. Do not drink any water within one hour of the test. . Do not eat any food 4 hours prior to the test. . You may take your regular medications prior to the test.  . Take metoprolol (  Lopressor) two hours prior to test. . HOLD Furosemide/Hydrochlorothiazide morning of the test.  After the Test: . Drink plenty of water. . After receiving IV contrast, you may experience a mild flushed feeling. This is normal. . On occasion, you may experience a mild rash up to 24 hours after the test. This is not dangerous. If this occurs, you can take  Benadryl 25 mg and increase your fluid intake. . If you experience trouble breathing, this can be serious. If it is severe call 911 IMMEDIATELY. If it is mild, please call our office. . If you take any of these medications: Glipizide/Metformin, Avandament, Glucavance, please do not take 48 hours after completing test unless otherwise instructed.   Once we have confirmed authorization from your insurance company, we will call you to set up a date and time for your test.   For non-scheduling related questions, please contact the cardiac imaging nurse navigator should you have any questions/concerns: Marchia Bond, RN Navigator Cardiac Imaging [facility] [phone] Office      Electrophysiology/Ablation Procedure Instructions   You are scheduled for a(n)  ablation on 03/12/2019 with Dr. Allegra Lai.   1.   Pre procedure testing-             A.  LAB WORK --- On 03/09/2019-  you will first go to the [facility] office (see address at the top of this letter) at 11:45 am for your pre procedure blood work.                 B. COVID TEST-- On 03/09/2019 @ 12:15 pm -  after you get your lab work- You will go to [facility] ([place] for your Covid testing.   This is a drive thru test site.  There will be multiple testing areas.  Be sure to share with the first checkpoint that you are there for pre-procedure/surgery testing. This will put you into the right (yellow) lane that leads to the PAT testing team. Stay in your car and the nurse team will come to your car to test you.  After you are tested please go home and self quarantine until the day of your procedure.     2. On the day of your procedure 03/12/2019 you will go to [facility] [phone]) at 5:30 am.  Dennis Bast will go to the main entrance [address] [employer]) and enter where the [employer] are.  Your driver will drop you off and you will head down the hallway to ADMITTING.  You  may have one support person come in to the hospital with you.  They will be asked to wait in the waiting room.   3.   Do not eat or drink after midnight prior to your procedure.   4.   Do NOT take any medications the morning of your procedure.   5.  Plan for an overnight stay.  If you use your phone frequently bring your phone charger.   6. You will follow up with the AFIB clinic 4 weeks after your procedure.  You will follow up with Dr. Curt Bears  3 months after your procedure.  These appointments will be made for you.   * If you have ANY questions please call the office [phone] and ask for  RN or send me a MyChart message   * Occasionally, EP Studies and ablations can become lengthy.  Please make your family aware  of this before your procedure starts.  Average time ranges from 2-8 hours for EP studies/ablations.  Your physician will call your family after the procedure with the results.                                    Cardiac Ablation Cardiac ablation is a procedure to disable (ablate) a small amount of heart tissue in very specific places. The heart has many electrical connections. Sometimes these connections are abnormal and can cause the heart to beat very fast or irregularly. Ablating some of the problem areas can improve the heart rhythm or return it to normal. Ablation may be done for people who:  Have Wolff-Parkinson-White syndrome.  Have fast heart rhythms (tachycardia).  Have taken medicines for an abnormal heart rhythm (arrhythmia) that were not effective or caused side effects.  Have a high-risk heartbeat that may be life-threatening.  During the procedure, a small incision is made in the neck or the groin, and a long, thin, flexible tube (catheter) is inserted into the incision and moved to the heart. Small devices (electrodes) on the tip of the catheter will send out electrical currents. A type of X-ray (fluoroscopy) will be used to help guide the catheter and  to provide images of the heart. Tell a health care provider about:  Any allergies you have.  All medicines you are taking, including vitamins, herbs, eye drops, creams, and over-the-counter medicines.  Any problems you or family members have had with anesthetic medicines.  Any blood disorders you have.  Any surgeries you have had.  Any medical conditions you have, such as kidney failure.  Whether you are pregnant or may be pregnant. What are the risks? Generally, this is a safe procedure. However, problems may occur, including:  Infection.  Bruising and bleeding at the catheter insertion site.  Bleeding into the chest, especially into the sac that surrounds the heart. This is a serious complication.  Stroke or blood clots.  Damage to other structures or organs.  Allergic reaction to medicines or dyes.  Need for a permanent pacemaker if the normal electrical system is damaged. A pacemaker is a small computer that sends electrical signals to the heart and helps your heart beat normally.  The procedure not being fully effective. This may not be recognized until months later. Repeat ablation procedures are sometimes required.  What happens before the procedure?  Follow instructions from your health care provider about eating or drinking restrictions.  Ask your health care provider about: ? Changing or stopping your regular medicines. This is especially important if you are taking diabetes medicines or blood thinners. ? Taking medicines such as aspirin and ibuprofen. These medicines can thin your blood. Do not take these medicines before your procedure if your health care provider instructs you not to.  Plan to have someone take you home from the hospital or clinic.  If you will be going home right after the procedure, plan to have someone with you for 24 hours. What happens during the procedure?  To lower your risk of infection: ? Your health care team will wash or  sanitize their hands. ? Your skin will be washed with soap. ? Hair may be removed from the incision area.  An IV tube will be inserted into one of your veins.  You will be given a medicine to help you relax (sedative).  The skin on your neck or  groin will be numbed.  An incision will be made in your neck or your groin.  A needle will be inserted through the incision and into a large vein in your neck or groin.  A catheter will be inserted into the needle and moved to your heart.  Dye may be injected through the catheter to help your surgeon see the area of the heart that needs treatment.  Electrical currents will be sent from the catheter to ablate heart tissue in desired areas. There are three types of energy that may be used to ablate heart tissue: ? Heat (radiofrequency energy). ? Laser energy. ? Extreme cold (cryoablation).  When the necessary tissue has been ablated, the catheter will be removed.  Pressure will be held on the catheter insertion area to prevent excessive bleeding.  A bandage (dressing) will be placed over the catheter insertion area. The procedure may vary among health care providers and hospitals. What happens after the procedure?  Your blood pressure, heart rate, breathing rate, and blood oxygen level will be monitored until the medicines you were given have worn off.  Your catheter insertion area will be monitored for bleeding. You will need to lie still for a few hours to ensure that you do not bleed from the catheter insertion area.  Do not drive for 5-7 days or as long as directed by your health care provider. Summary  Cardiac ablation is a procedure to disable (ablate) a small amount of heart tissue in very specific places. Ablating some of the problem areas can improve the heart rhythm or return it to normal.  During the procedure, electrical currents will be sent from the catheter to ablate heart tissue in desired areas. This information is not  intended to replace advice given to you by your health care provider. Make sure you discuss any questions you have with your health care provider. Document Released: 07/01/2008 Document Revised: 01/02/2016 Document Reviewed: 01/02/2016 Elsevier Interactive Patient Education  [employer].

## 2019-01-28 ENCOUNTER — Telehealth: Payer: Self-pay | Admitting: Cardiology

## 2019-01-28 DIAGNOSIS — R0602 Shortness of breath: Secondary | ICD-10-CM

## 2019-01-28 MED ORDER — POTASSIUM CHLORIDE CRYS ER 20 MEQ PO TBCR: 20.0000 meq | EXTENDED_RELEASE_TABLET | Freq: Every day | ORAL | 0 refills | Status: DC

## 2019-01-28 NOTE — Telephone Encounter (Signed)
Refill for potassium chloride re[facility]sent [facility] [place] as requested.

## 2019-01-28 NOTE — Telephone Encounter (Signed)
[facility] says [facility] have not [facility] a refill request for his Potassium although we show we sent it and received confirmation.  Patient requests we resend it.  I confirmed we are sending to the correct pharmacy -- [place]

## 2019-02-04 ENCOUNTER — Telehealth: Payer: Self-pay | Admitting: *Deleted

## 2019-02-04 ENCOUNTER — Other Ambulatory Visit: Payer: Self-pay | Admitting: *Deleted

## 2019-02-04 DIAGNOSIS — R0602 Shortness of breath: Secondary | ICD-10-CM

## 2019-02-04 NOTE — Telephone Encounter (Signed)
Unfortunately, because of SOB I cannot see him in the office. We could do a virtual visit or ok to place pulmonary referral if he would prefer. If extreme issues, he should go to UC/ED.

## 2019-02-04 NOTE — Telephone Encounter (Signed)
Spoke with patient and patient and he stated that he is having lung issues and would like to be seen by his pcp or referral to pulmonology. Patent stated that he has been in the hospital a couple of times and has been tested a few times for Covid, which were all negative. Patient also stated that he hasn't slept in about 4 days because of his breathing problems. Patient wants to know if he has to be seen by PCP or could a referral be placed. Please advise.  Copied from [place] [phone]. Topic: General - Other >> Feb 04, 2019  9:15 AM Leward Quan A wrote: Reason for CRM: Patient would like a call back today to schedule an appointment today to see Dr Jerilee Hoh. He can be reached at Ph#  [phone]

## 2019-02-04 NOTE — Telephone Encounter (Signed)
Referral placed. Left message for patient to return call to office.

## 2019-02-06 ENCOUNTER — Other Ambulatory Visit: Payer: Self-pay

## 2019-02-06 ENCOUNTER — Encounter: Payer: Self-pay | Admitting: Pulmonary Disease

## 2019-02-06 ENCOUNTER — Ambulatory Visit: Payer: Medicare Other | Admitting: Pulmonary Disease

## 2019-02-06 DIAGNOSIS — J4541 Moderate persistent asthma with (acute) exacerbation: Secondary | ICD-10-CM | POA: Diagnosis not present

## 2019-02-06 DIAGNOSIS — I5032 Chronic diastolic (congestive) heart failure: Secondary | ICD-10-CM | POA: Diagnosis not present

## 2019-02-06 MED ORDER — PREDNISONE 10 MG PO TABS: ORAL_TABLET | ORAL | 0 refills | Status: DC

## 2019-02-06 MED ORDER — BROVANA 15 MCG/2ML IN NEBU: 15.0000 ug | INHALATION_SOLUTION | Freq: Two times a day (BID) | RESPIRATORY_TRACT | 2 refills | Status: DC

## 2019-02-06 MED ORDER — BUDESONIDE 0.5 MG/2ML IN SUSP: 0.5000 mg | Freq: Two times a day (BID) | RESPIRATORY_TRACT | 2 refills | Status: DC

## 2019-02-06 NOTE — Patient Instructions (Addendum)
Prednisone 10 mg tabs  Take 4 tablets daily for 3 days, then 3 tablets daily for 3 days, then 2 tabs daily with food x 5ds, then 1 tab daily with food x 5ds then STOP #40  Prescription for budesonide 0.5 mg nebs twice daily # 60 with 2 refills Brovana nebs twice daily#60 with 2 refills  Blood work today- RAST panel  STOP taking lisinopril -we will check her blood pressure on next visit and use alternative medication if required

## 2019-02-06 NOTE — Assessment & Plan Note (Addendum)
STOP taking lisinopril -we will check  blood pressure on next visit and use alternative medication such as losartan, if needed Expect sugars to go high with prednisone

## 2019-02-06 NOTE — Assessment & Plan Note (Signed)
He does not carry a prior history of asthma, never smoker so doubt COPD.  He does seem to have reactive airway disease but unclear what the trigger is at this time  Eosinophilia noted Prednisone 10 mg tabs  Take 4 tablets daily for 3 days, then 3 tablets daily for 3 days, then 2 tabs daily with food x 5ds, then 1 tab daily with food x 5ds then STOP #40  Prescription for budesonide 0.5 mg nebs twice daily # 60 with 2 refills Brovana nebs twice daily#60 with 2 refills  Blood work today- RAST panel

## 2019-02-06 NOTE — Progress Notes (Signed)
Subjective:    Patient ID: Charles Lloyd., male    DOB: 1941/02/05, 78 y.o.   MRN: [id number]  HPI  78 year old never smoker presents for evaluation of recurrent dyspnea, cough and wheezing He is a transplant from [place] and lived at the [place] for 30 years and has just moved to a townhouse and [place] about a year ago.  His son is a Chartered certified accountant for [employer]  He was admitted 10/21-10/23 for acute onset dyspnea and wheezing , PCP had already tried Augmentin as outpatient and was treated for acute tracheobronchitis , Covid testing was negative, procalcitonin was negative, he improved with antibiotic and steroids and was discharged on oxygen. He felt better for a few days and then worsened again and required another admission 11/9-11/10 for similar problem, chest x-ray was again clear, personally reviewed was again discharged on prednisone and steroids.  For the past few days he is again feeling worse with dyspnea and coughing up foamy white phlegm.  He seems to have a dry cough during the day but at night he develops wheezing and has to take his nebulizer and coughs up yellow-green sputum and then feels better.  He developed tachycardia with albuterol and hence has Xopenex nebs  He denies paroxysmal nocturnal dyspnea or orthopnea or chest pain  He denies childhood history of asthma is a never smoker, never had a problem with allergies Environment-lives by himself in a 2 floor townhome, opens up to [place] in his backyard and woods  Medication review shows lisinopril   PMH - hypertension, hyperlipidemia, diabetes mellitus type 2, chronic atrial fibrillation on Eliquis (currently awaiting ablation), chronic diastolic congestive heart failure  Underwent DCCV 76 weeks ago  Significant tests/ events reviewed  Echo 09/2018 showed normal EF Absolute eosinophil count 1700 on 11/9, 1800 on 10/21, 400 on 07/24/18   Past Medical History:  Diagnosis Date  . B12 deficiency     . 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 Cancer ([place])   . Chronic anticoagulation   . Chronic diastolic (congestive) heart failure (HCC) 04/16/2013   Functional class II, ejection fraction 35-40%  Last Assessment & Plan:  Clinically stable Volume well controlled meds reviewed  . Colon polyps 04/16/2013  . Diabetic neuropathy ([place])   . Essential hypertension 01/06/2010   Last Assessment & Plan:  Well controlled Continue med management  . 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  Steroid usage was discontinued several months ago.  Follow up with Dermatology as planned.  . Hyperlipidemia associated with type 2 diabetes mellitus ([place]) 01/28/2018  . Infection of prosthetic right knee joint ([place])   . 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 On amiodarone therapy 09/07/2013  . On continuous oral anticoagulation 01/28/2018  . Overweight (BMI 25.0-29.9) 10/22/2016  . Persistent atrial fibrillation ([place]) 04/16/2013   Last Assessment & Plan:  Rate controlled Continue med management eliquis for stroke preventino  Formatting of this note might be different from the original.  Drug  HX Current Rx Pre-ABL inefficacy Pre-ABL intolerant Post-ABL inefficacy Post-ABL intolerant max dose/24h M/Y end comments  sotalol                  dofetilide  flecainide                  propafenone                  am  . S/P TKR (total knee replacement), bilateral   . Tinea cruris 04/16/2013  . Type 2 diabetes mellitus with diabetic polyneuropathy, without long-term current use of insulin ([place]) 04/16/2013   Last Assessment & Plan:  Labs today Pt reports well controlled on ambulatory monitoring    Past Surgical History:  Procedure Laterality Date  . CARDIAC ELECTROPHYSIOLOGY STUDY AND ABLATION    . CARDIOVERSION    .  CARDIOVERSION N/A 10/10/2018   Procedure: CARDIOVERSION;  Surgeon: [name], MD;  Location: [facility] ENDOSCOPY;  Service: Cardiovascular;  Laterality: N/A;  . CHOLECYSTECTOMY    . PROSTATECTOMY    . REPLACEMENT TOTAL KNEE BILATERAL      No Known Allergies  Social History   Socioeconomic History  . Marital status: Widowed    Spouse name: Not on file  . Number of children: Not on file  . Years of education: Not on file  . Highest education level: Not on file  Occupational History  . Not on file  Tobacco Use  . Smoking status: Never Smoker  . Smokeless tobacco: Never Used  Substance and Sexual Activity  . Alcohol use: Yes    Alcohol/week: 2.0 standard drinks    Types: 2 Glasses of wine per week    Comment: daily  . Drug use: Never  . Sexual activity: Not Currently  Other Topics Concern  . Not on file  Social History Narrative  . Not on file   Social Determinants of Health   Financial Resource Strain:   . Difficulty of Paying Living Expenses: Not on file  Food Insecurity:   . Worried About Charity fundraiser in the Last Year: Not on file  . Ran Out of Food in the Last Year: Not on file  Transportation Needs:   . Lack of Transportation (Medical): Not on file  . Lack of Transportation (Non-Medical): Not on file  Physical Activity:   . Days of Exercise per Week: Not on file  . Minutes of Exercise per Session: Not on file  Stress:   . Feeling of Stress : Not on file  Social Connections:   . Frequency of Communication with Friends and Family: Not on file  . Frequency of Social Gatherings with Friends and Family: Not on file  . Attends Religious Services: Not on file  . Active Member of Clubs or Organizations: Not on file  . Attends Archivist Meetings: Not on file  . Marital Status: Not on file  Intimate Partner Violence:   . Fear of Current or Ex-Partner: Not on file  . Emotionally Abused: Not on file  . Physically Abused: Not on file  . Sexually Abused:  Not on file      Family History  Problem Relation Age of Onset  . Cancer Mother   . Depression Mother   . Early death Mother   . Cancer Father   . Depression Father   . Early death Father     Social History   Socioeconomic History  . Marital status: Widowed    Spouse name: Not on file  . Number of children: Not on file  . Years of education: Not on file  . Highest education level: Not on file  Occupational History  . Not on file  Tobacco Use  . Smoking status: Never Smoker  . Smokeless tobacco: Never Used  Substance and Sexual Activity  . Alcohol use: Yes    Alcohol/week: 2.0 standard drinks    Types: 2 Glasses of wine per week    Comment: daily  . Drug use: Never  . Sexual activity: Not Currently  Other Topics Concern  . Not on file  Social History Narrative  . Not on file   Social Determinants of Health   Financial Resource Strain:   . Difficulty of Paying Living Expenses: Not on file  Food Insecurity:   . Worried About Charity fundraiser in the Last Year: Not on file  . Ran Out of Food in the Last Year: Not on file  Transportation Needs:   . Lack of Transportation (Medical): Not on file  . Lack of Transportation (Non-Medical): Not on file  Physical Activity:   . Days of Exercise per Week: Not on file  . Minutes of Exercise per Session: Not on file  Stress:   . Feeling of Stress : Not on file  Social Connections:   . Frequency of Communication with Friends and Family: Not on file  . Frequency of Social Gatherings with Friends and Family: Not on file  . Attends Religious Services: Not on file  . Active Member of Clubs or Organizations: Not on file  . Attends Archivist Meetings: Not on file  . Marital Status: Not on file  Intimate Partner Violence:   . Fear of Current or Ex-Partner: Not on file  . Emotionally Abused: Not on file  . Physically Abused: Not on file  . Sexually Abused: Not on file    Family History  Problem Relation Age of  Onset  . Cancer Mother   . Depression Mother   . Early death Mother   . Cancer Father   . Depression Father   . Early death Father     Review of Systems  Constitutional: negative for anorexia, fevers and sweats  Eyes: negative for irritation, redness and visual disturbance  Ears, nose, mouth, throat, and face: negative for earaches, epistaxis, nasal congestion and sore throat  Respiratory: Positive for cough, dyspnea on exertion, sputum and wheezing  Cardiovascular: negative for chest pain, dyspnea, lower extremity edema, orthopnea, and syncope positive for irregular heartbeat and palpitations Gastrointestinal: negative for abdominal pain, constipation, diarrhea, melena, nausea and vomiting  Genitourinary:negative for dysuria, frequency and hematuria  Hematologic/lymphatic: negative for bleeding, easy bruising and lymphadenopathy  Musculoskeletal:negative for arthralgias, muscle weakness and stiff joints  Neurological: negative for coordination problems, gait problems, headaches and weakness  Endocrine: negative for diabetic symptoms including polydipsia, polyuria and weight loss     Objective:   Physical Exam  Gen. Pleasant,  in no distress, normal affect ENT - no pallor,icterus, no post nasal drip, class 2 airway Neck: No JVD, no thyromegaly, no carotid bruits Lungs: no use of accessory muscles, no dullness to percussion, decreased without rales , faint scattered expiratory rhonchi  Cardiovascular: Rhythm regular, heart sounds  normal, no murmurs or gallops, no peripheral edema Abdomen: soft and non-tender, no hepatosplenomegaly, BS normal. Musculoskeletal: No deformities, no cyanosis or clubbing Neuro:  alert, non focal, no tremors       Assessment & Plan:

## 2019-02-09 ENCOUNTER — Ambulatory Visit (HOSPITAL_COMMUNITY): Payer: Medicare Other | Admitting: Physician Assistant

## 2019-02-09 ENCOUNTER — Other Ambulatory Visit: Payer: Self-pay | Admitting: Internal Medicine

## 2019-02-09 LAB — ALLERGEN PROFILE, PERENNIAL ALLERGEN IGE
Alternaria Alternata IgE: <0.1 kU/L
Aspergillus Fumigatus IgE: <0.1 kU/L
Aureobasidi Pullulans IgE: <0.1 kU/L
Candida Albicans IgE: 0.44 kU/L — AB
Cat Dander IgE: 0.99 kU/L — AB
Chicken Feathers IgE: <0.1 kU/L
Cladosporium Herbarum IgE: <0.1 kU/L
Cow Dander IgE: 0.34 kU/L — AB
D Farinae IgE: 0.91 kU/L — AB
D Pteronyssinus IgE: 0.62 kU/L — AB
Dog Dander IgE: 0.42 kU/L — AB
Duck Feathers IgE: <0.1 kU/L
Goose Feathers IgE: <0.1 kU/L
Mouse Urine IgE: <0.1 kU/L
Mucor Racemosus IgE: <0.1 kU/L
Penicillium Chrysogen IgE: <0.1 kU/L
Phoma Betae IgE: <0.1 kU/L
Setomelanomma Rostrat: <0.1 kU/L
Stemphylium Herbarum IgE: <0.1 kU/L

## 2019-02-09 MED ORDER — FUROSEMIDE 40 MG PO TABS: 40.0000 mg | ORAL_TABLET | Freq: Two times a day (BID) | ORAL | 1 refills | Status: DC

## 2019-02-09 NOTE — Telephone Encounter (Signed)
Rx refilled electronically °

## 2019-02-09 NOTE — Telephone Encounter (Signed)
Refill furosemide (LASIX) 40 MG tablet  PHARMACY  Please send 30 day supply to local pharmacy.  [facility] but its too late to send to mail order. Patient only has a couple pills left.  [id number] - [place], [place] Phone:  [phone]  Fax:  [phone]

## 2019-02-16 NOTE — Progress Notes (Signed)
@Patient  ID: Charles Lloyd., male    DOB: 05-12-1940, 78 y.o.   MRN: [id number]  Chief Complaint  Patient presents with  . Follow-up    10 day f/u after being put on neb solutions. States his breathing has improved.     Referring provider: Isaac Bliss, [place]*  HPI:  78 year old male never smoker initially consulted with our office on 02/06/2019 for recurrent bronchitis-like episodes as well as history of peripheral eosinophilia.  Patient was evaluated by Dr. Elsworth Soho and diagnosed with suspected reactive airways disease.  PMH: hypertension, hyperlipidemia, diabetes mellitus type 2, chronic atrial fibrillation on Eliquis (currently awaiting ablation), chronic diastolic congestive heart failure  Underwent DCCV 2 weeks ago Smoker/ Smoking History: Never smoker Maintenance: Pulmicort nebs, Brovana nebs Pt of: Dr. Elsworth Soho  02/17/2019  - Visit   78 year old male never smoker presenting to office today as a 10-day follow-up.  Patient reporting that symptoms have improved significantly since starting Pulmicort nebs, Brovana nebs and finishing a course of prednisone.  Patient reports that he feels that his breathing is back to baseline.  Patient does admit today that he has had a history of previous allergy work-ups.  We do not have these records.  He reports that his allergist located in [place] [facility] told him that he did not need to follow-up with them after he took them off of allergy shots.  He also reports that he is an ear nose and throat doctor in [place] Dr. Vevelyn Royals.  We also do not have these records.  He believes that he may have had nasal polyps in the past.  He is unsure.  We will request these records today.  Blood work does show peripheral eosinophil counts that are elevated.  And symptoms did respond to steroids.  He denies history of childhood asthma.  All of the symptoms started around October/2020.  Symptoms have worsened and he had been  hospitalized twice.  He is grateful that his symptoms have stabilized.  Tests:   Echo 09/2018 showed normal EF Absolute eosinophil count 1700 on 11/9, 1800 on 10/21, 400 on 07/24/18  FENO:  No results found for: NITRICOXIDE  PFT: No flowsheet data found.  WALK:  No flowsheet data found.  Imaging: No results found.  Lab Results:  CBC    Component Value Date/Time   WBC 3.5 (L) 01/06/2019 0418   RBC 3.96 (L) 01/06/2019 0418   HGB 11.4 (L) 01/06/2019 0418   HGB 11.6 (L) 12/25/2018 1014   HCT 35.8 (L) 01/06/2019 0418   HCT 35.5 (L) 12/25/2018 1014   PLT 217 01/06/2019 0418   PLT 287 12/25/2018 1014   MCV 90.4 01/06/2019 0418   MCV 87 12/25/2018 1014   MCH 28.8 01/06/2019 0418   MCHC 31.8 01/06/2019 0418   RDW 16.3 (H) 01/06/2019 0418   RDW 16.1 (H) 12/25/2018 1014   LYMPHSABS 0.8 01/05/2019 1242   MONOABS 0.5 01/05/2019 1242   EOSABS 1.7 (H) 01/05/2019 1242   BASOSABS 0.1 01/05/2019 1242    BMET    Component Value Date/Time   NA 132 (L) 01/06/2019 0418   NA 136 12/25/2018 1014   K 4.5 01/06/2019 0418   CL 99 01/06/2019 0418   CO2 21 (L) 01/06/2019 0418   GLUCOSE 234 (H) 01/06/2019 0418   BUN 18 01/06/2019 0418   BUN 17 12/25/2018 1014   CREATININE 1.04 01/06/2019 0418   CALCIUM 8.6 (L) 01/06/2019 0418   GFRNONAA >60 01/06/2019 [id number]  GFRAA >60 01/06/2019 0418    BNP    Component Value Date/Time   BNP 590.7 (H) 01/05/2019 1242    ProBNP    Component Value Date/Time   PROBNP 1,062 (H) 11/06/2018 1005   PROBNP 289.0 (H) 09/22/2018 1107    Specialty Problems      Pulmonary Problems   Mild reactive airways disease   Acute tracheobronchitis      No Known Allergies  Immunization History  Administered Date(s) Administered  . Fluad Quad(high Dose 65+) 11/25/2018  . Influenza, High Dose Seasonal PF 11/21/2010, 01/19/2013, 02/01/2014, 02/07/2015, 12/28/2015, 10/22/2016, 01/28/2018  . Influenza,trivalent, recombinat, inj, PF 01/11/2012  .  Influenza-Unspecified 12/09/2009  . Pneumococcal Conjugate-13 01/19/2013  . Pneumococcal Polysaccharide-23 12/01/2008  . Tdap 10/28/2006  . Zoster 10/28/2006  . Zoster Recombinat (Shingrix) 01/28/2018, 07/24/2018    Past Medical History:  Diagnosis Date  . B12 deficiency   . 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 Cancer ([place])   . Chronic anticoagulation   . Chronic diastolic (congestive) heart failure (HCC) 04/16/2013   Functional class II, ejection fraction 35-40%  Last Assessment & Plan:  Clinically stable Volume well controlled meds reviewed  . Colon polyps 04/16/2013  . Diabetic neuropathy ([place])   . Essential hypertension 01/06/2010   Last Assessment & Plan:  Well controlled Continue med management  . 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  Steroid usage was discontinued several months ago.  Follow up with Dermatology as planned.  . Hyperlipidemia associated with type 2 diabetes mellitus ([place]) 01/28/2018  . Infection of prosthetic right knee joint ([place])   . 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 On amiodarone therapy 09/07/2013  . On continuous oral anticoagulation 01/28/2018  . Overweight (BMI 25.0-29.9) 10/22/2016  . Persistent atrial fibrillation ([place]) 04/16/2013   Last Assessment & Plan:  Rate controlled Continue med management eliquis for stroke preventino  Formatting of this note might be different from the original.  Drug  HX Current Rx Pre-ABL inefficacy Pre-ABL intolerant Post-ABL inefficacy Post-ABL intolerant max dose/24h M/Y end comments  sotalol                  dofetilide                  flecainide                  propafenone                  am  . S/P TKR (total knee replacement), bilateral   . Tinea cruris 04/16/2013  . Type 2 diabetes mellitus with diabetic  polyneuropathy, without long-term current use of insulin ([place]) 04/16/2013   Last Assessment & Plan:  Labs today Pt reports well controlled on ambulatory monitoring    Tobacco History: Social History   Tobacco Use  Smoking Status Never Smoker  Smokeless Tobacco Never Used   Counseling given: Yes  Continue to not smoke  Outpatient Encounter Medications as of 02/17/2019  Medication Sig  . arformoterol (BROVANA) 15 MCG/2ML NEBU Take 2 mLs (15 mcg total) by nebulization 2 (two) times daily.  Marland Kitchen atorvastatin (LIPITOR) 20 MG tablet TAKE 1 TABLET DAILY AT 6 P.M. (Patient taking differently: Take 20 mg by mouth daily. )  .  budesonide (PULMICORT) 0.5 MG/2ML nebulizer solution Take 2 mLs (0.5 mg total) by nebulization 2 (two) times daily.  Marland Kitchen diltiazem (CARDIZEM CD) 360 MG 24 hr capsule TAKE 1 CAPSULE DAILY (Patient taking differently: Take 360 mg by mouth daily. )  . ELIQUIS 5 MG TABS tablet TAKE 1 TABLET TWICE A DAY (Patient taking differently: Take 5 mg by mouth 2 (two) times daily. )  . fluticasone (FLONASE) 50 MCG/ACT nasal spray Place 2 sprays into both nostrils daily.  . furosemide (LASIX) 40 MG tablet Take 1 tablet (40 mg total) by mouth 2 (two) times daily.  Marland Kitchen gabapentin (NEURONTIN) 300 MG capsule Take 2 capsules (600 mg total) by mouth at bedtime.  Marland Kitchen glucose blood ([employer] TEST STRIPS) test strip Test 2x dailyDx: type 2 diabetes, uncontrolledfluctuating blood sugars  . glucose blood test strip Use to test blood sugars once daily as directed Dx:E11.65  . loratadine (CLARITIN) 10 MG tablet Take 1 tablet (10 mg total) by mouth daily.  . metFORMIN (GLUCOPHAGE) 500 MG tablet TAKE 1 TABLET TWICE A DAY WITH MEALS  . metoprolol tartrate (LOPRESSOR) 50 MG tablet Take 2 tablets (100 mg total) by mouth 2 (two) times daily.  . montelukast (SINGULAIR) 10 MG tablet Take 10 mg by mouth at bedtime.   . potassium chloride SA (KLOR-CON) 20 MEQ tablet Take 1 tablet (20 mEq total) by mouth daily.  Marland Kitchen  levalbuterol (XOPENEX) 0.63 MG/3ML nebulizer solution Take 3 mLs (0.63 mg total) by nebulization every 8 (eight) hours as needed for up to 10 days for wheezing or shortness of breath.  . [DISCONTINUED] lisinopril (ZESTRIL) 10 MG tablet Take 1 tablet (10 mg total) by mouth every evening.  . [DISCONTINUED] predniSONE (DELTASONE) 10 MG tablet Prednisone 40 mg po daily for 3 days, then 30 Mg p.o. daily for 3 days, then 20 Mg p.o. daily for 3 days and 10 Mg p.o. daily for 3 days and stop. (Patient not taking: Reported on 02/06/2019)  . [DISCONTINUED] predniSONE (DELTASONE) 10 MG tablet Take 4 tablets daily with food x 3 days, take 3 tablets daily with food x 3 days, take 2 tablets daily with food x 5 days, then take 1 tablet daily with food x 5 days then stop.   No facility-administered encounter medications on file as of 02/17/2019.     Review of Systems  Review of Systems  Constitutional: Negative for activity change, chills, fatigue, fever and unexpected weight change.  HENT: Negative for postnasal drip, rhinorrhea, sinus pressure, sinus pain and sore throat.   Eyes: Negative.   Respiratory: Negative for cough, shortness of breath and wheezing.   Cardiovascular: Negative for chest pain and palpitations.  Gastrointestinal: Negative for constipation, diarrhea, nausea and vomiting.  Endocrine: Negative.   Genitourinary: Negative.   Musculoskeletal: Negative.   Skin: Negative.   Neurological: Negative for dizziness and headaches.  Psychiatric/Behavioral: Negative.  Negative for dysphoric mood. The patient is not nervous/anxious.   All other systems reviewed and are negative.    Physical Exam  BP 114/72 (BP Location: Left Arm, Patient Position: Sitting, Cuff Size: Normal)   Pulse 70   Temp (!) 97.3 F (36.3 C) (Temporal)   Ht 5\' 10"  (1.778 m)   Wt 180 lb (81.6 kg)   SpO2 98% Comment: on RA  BMI 25.83 kg/m   Wt Readings from Last 5 Encounters:  02/17/19 180 lb (81.6 kg)  02/06/19 186  lb 9.6 oz (84.6 kg)  01/27/19 189 lb 9.6 oz (86 kg)  01/05/19  182 lb (82.6 kg)  12/29/18 183 lb (83 kg)    BMI Readings from Last 5 Encounters:  02/17/19 25.83 kg/m  02/06/19 26.77 kg/m  01/27/19 27.20 kg/m  01/05/19 26.11 kg/m  12/29/18 26.26 kg/m    Physical Exam Vitals and nursing note reviewed.  Constitutional:      General: He is not in acute distress.    Appearance: Normal appearance. He is obese.  HENT:     Head: Normocephalic and atraumatic.     Right Ear: Hearing, tympanic membrane, ear canal and external ear normal.     Left Ear: Hearing, tympanic membrane, ear canal and external ear normal.     Nose: Rhinorrhea present. No mucosal edema.     Right Turbinates: Not enlarged.     Left Turbinates: Not enlarged.     Mouth/Throat:     Mouth: Mucous membranes are dry.     Pharynx: Oropharynx is clear. No oropharyngeal exudate.     Comments: pnd Eyes:     Pupils: Pupils are equal, round, and reactive to light.  Cardiovascular:     Rate and Rhythm: Normal rate and regular rhythm.     Pulses: Normal pulses.     Heart sounds: Normal heart sounds. No murmur.  Pulmonary:     Effort: Pulmonary effort is normal.     Breath sounds: Normal breath sounds. No decreased breath sounds, wheezing or rales.  Abdominal:     General: Bowel sounds are normal. There is no distension.     Palpations: Abdomen is soft.     Tenderness: There is no abdominal tenderness.  Musculoskeletal:     Cervical back: Normal range of motion.     Right lower leg: No edema.     Left lower leg: No edema.  Lymphadenopathy:     Cervical: No cervical adenopathy.  Skin:    General: Skin is warm and dry.     Capillary Refill: Capillary refill takes less than 2 seconds.     Findings: No erythema or rash.  Neurological:     General: No focal deficit present.     Mental Status: He is alert and oriented to person, place, and time.     Motor: No weakness.     Coordination: Coordination normal.      Gait: Gait is intact. Gait normal.  Psychiatric:        Mood and Affect: Mood normal.        Behavior: Behavior normal. Behavior is cooperative.        Thought Content: Thought content normal.        Judgment: Judgment normal.      Assessment & Plan:   Mild reactive airways disease Plan: Continue Brovana nebulized meds Continue Pulmicort nebulized meds May need to consider inhaler use in the future if patient has stable intervals If patient flares again likely should request pulmonary function testing We will request records from allergy as well as ENT  Eosinophil count raised Plan: Request records from ENT and allergy    Return in about 2 months (around 04/20/2019), or if symptoms worsen or fail to improve, for Follow up with Dr. Elsworth Soho.   Lauraine Rinne, NP 02/17/2019   This appointment was 26 minutes long with over 50% of the time in direct face-to-face patient care, assessment, plan of care, and follow-up.

## 2019-02-17 ENCOUNTER — Encounter: Payer: Self-pay | Admitting: Pulmonary Disease

## 2019-02-17 ENCOUNTER — Ambulatory Visit: Payer: Medicare Other | Admitting: Pulmonary Disease

## 2019-02-17 ENCOUNTER — Other Ambulatory Visit: Payer: Self-pay

## 2019-02-17 VITALS — BP 114/72 | HR 70 | Temp 97.3°F | Ht 70.0 in | Wt 180.0 lb

## 2019-02-17 DIAGNOSIS — D7219 Other eosinophilia: Secondary | ICD-10-CM

## 2019-02-17 DIAGNOSIS — J4530 Mild persistent asthma, uncomplicated: Secondary | ICD-10-CM

## 2019-02-17 DIAGNOSIS — R898 Other abnormal findings in specimens from other organs, systems and tissues: Secondary | ICD-10-CM | POA: Insufficient documentation

## 2019-02-17 HISTORY — DX: Other abnormal findings in specimens from other organs, systems and tissues: R89.8

## 2019-02-17 NOTE — Patient Instructions (Addendum)
You were seen today by Lauraine Rinne, NP  for:   1. Mild persistent reactive airway disease without complication  Continue Pulmicort nebulized meds twice daily Continue Brovana nebulized meds twice daily  We will request records from your previous allergist-[facility] [place] We will also request records from your previous ENT-Dr. Vevelyn Royals [facility] ear nose and throat  We will have you follow-up with our office in about 6 to 8 weeks with Dr. Elsworth Soho   Follow Up:    Return in about 2 months (around 04/20/2019), or if symptoms worsen or fail to improve, for Follow up with Dr. Elsworth Soho.   Please do your part to reduce the spread of COVID-19:      Reduce your risk of any infection  and COVID19 by using the similar precautions used for avoiding the common cold or flu:  Marland Kitchen Wash your hands often with soap and warm water for at least 20 seconds.  If soap and water are not readily available, use an alcohol-based hand sanitizer with at least 60% alcohol.  . If coughing or sneezing, cover your mouth and nose by coughing or sneezing into the elbow areas of your shirt or coat, into a tissue or into your sleeve (not your hands). Langley Gauss A MASK when in public  . Avoid shaking hands with others and consider head nods or verbal greetings only. . Avoid touching your eyes, nose, or mouth with unwashed hands.  . Avoid close contact with people who are sick. . Avoid places or events with large numbers of people in one location, like concerts or sporting events. . If you have some symptoms but not all symptoms, continue to monitor at home and seek medical attention if your symptoms worsen. . If you are having a medical emergency, call 911.   [place] / e-Visit: [email or web address]         [facility]: [facility]: [id number]                   [facility]: [id number]     It is flu season:   >>> Best ways to protect herself from the flu: Receive the yearly flu vaccine, practice good hand hygiene washing with soap and also using hand sanitizer when available, eat a nutritious meals, get adequate rest, hydrate appropriately   Please contact the office if your symptoms worsen or you have concerns that you are not improving.   Thank you for choosing [facility] for your healthcare, and for allowing [place] to partner with you on your healthcare journey. I am thankful to be able to provide care to you today.   Wyn Quaker FNP-C

## 2019-02-17 NOTE — Assessment & Plan Note (Signed)
Plan: Request records from ENT and allergy

## 2019-02-17 NOTE — Assessment & Plan Note (Signed)
Plan: Continue Brovana nebulized meds Continue Pulmicort nebulized meds May need to consider inhaler use in the future if patient has stable intervals If patient flares again likely should request pulmonary function testing We will request records from allergy as well as ENT

## 2019-02-26 ENCOUNTER — Encounter: Payer: Self-pay | Admitting: Internal Medicine

## 2019-02-26 ENCOUNTER — Telehealth: Payer: Self-pay | Admitting: Pulmonary Disease

## 2019-02-26 NOTE — Telephone Encounter (Signed)
02/26/2019 1040  We have received the last documented note from [facility] [place].  This was on 01/08/2018.  This is when the patient establish care with Dr. Clarene Essex.   The office note makes reference to an ear nose and throat physician in [place], [place] where surgery was recommended but the patient deferred this.    Patient was encouraged to remain on Singulair as well as his Breo Ellipta 100.  Patient also was encouraged to remain on mometasone nasal spray twice daily for management of nasal polyps.  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   Office note was signed and placed in RA look at folder then can be sent to scan.  Wyn Quaker FNP

## 2019-03-03 ENCOUNTER — Other Ambulatory Visit: Payer: Self-pay

## 2019-03-03 ENCOUNTER — Telehealth (INDEPENDENT_AMBULATORY_CARE_PROVIDER_SITE_OTHER): Payer: Medicare Other | Admitting: Cardiology

## 2019-03-03 DIAGNOSIS — I4819 Other persistent atrial fibrillation: Secondary | ICD-10-CM

## 2019-03-03 NOTE — Progress Notes (Signed)
Electrophysiology TeleHealth Note   Due to national recommendations of social distancing due to COVID 19, an audio/video telehealth visit is felt to be most appropriate for this patient at this time.  See Epic message for the patient's consent to telehealth for [facility].   Date:  03/03/2019   ID:  Charles Gum., DOB [birth year], MRN [id number]  Location: patient's home  Provider location: [address], [place]  Evaluation Performed: Follow-up visit  PCP:  Isaac Bliss, [name], MD  Cardiologist:  [facility] Electrophysiologist:  Dr Curt Bears  Chief Complaint:  AF  History of Present Illness:    Charles Umberger. is a 79 y.o. male who presents via audio/video conferencing for a telehealth visit today.  Since last being seen in our clinic, the patient reports doing very well.  Today, he denies symptoms of palpitations, chest pain, shortness of breath,  lower extremity edema, dizziness, presyncope, or syncope.  The patient is otherwise without complaint today.  The patient denies symptoms of fevers, chills, cough, or new SOB worrisome for COVID 19.  She has a history of atrial fibrillation, mild systolic heart failure, diabetes, hyperlipidemia.  She had ablation for atrial fibrillation in 2015 with a repeat cardioversion in 2017 and a repeat cardioversion September 2019.  She has a plan for AF ablation 03/12/2019.  Today, denies symptoms of palpitations, chest pain, shortness of breath, orthopnea, PND, lower extremity edema, claudication, dizziness, presyncope, syncope, bleeding, or neurologic sequela. The patient is tolerating medications without difficulties.  In November, he was having respiratory issues.  He went to see pulmonology who started him on multiple inhalers which has improved his symptoms.  He was Covid negative.  Past Medical History:  Diagnosis Date  . B12 deficiency   . 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 Cancer  ([place])   . Chronic anticoagulation   . Chronic diastolic (congestive) heart failure (HCC) 04/16/2013   Functional class II, ejection fraction 35-40%  Last Assessment & Plan:  Clinically stable Volume well controlled meds reviewed  . Colon polyps 04/16/2013  . Diabetic neuropathy ([place])   . Essential hypertension 01/06/2010   Last Assessment & Plan:  Well controlled Continue med management  . 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  Steroid usage was discontinued several months ago.  Follow up with Dermatology as planned.  . Hyperlipidemia associated with type 2 diabetes mellitus ([place]) 01/28/2018  . Infection of prosthetic right knee joint ([place])   . 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 On amiodarone therapy 09/07/2013  . On continuous oral anticoagulation 01/28/2018  . Overweight (BMI 25.0-29.9) 10/22/2016  . Persistent atrial fibrillation ([place]) 04/16/2013   Last Assessment & Plan:  Rate controlled Continue med management eliquis for stroke preventino  Formatting of this note might be different from the original.  Drug  HX Current Rx Pre-ABL inefficacy Pre-ABL intolerant Post-ABL inefficacy Post-ABL intolerant max dose/24h M/Y end comments  sotalol                  dofetilide                  flecainide  propafenone                  am  . S/P TKR (total knee replacement), bilateral   . Tinea cruris 04/16/2013  . Type 2 diabetes mellitus with diabetic polyneuropathy, without long-term current use of insulin ([place]) 04/16/2013   Last Assessment & Plan:  Labs today Pt reports well controlled on ambulatory monitoring    Past Surgical History:  Procedure Laterality Date  . CARDIAC ELECTROPHYSIOLOGY STUDY AND ABLATION    . CARDIOVERSION    . CARDIOVERSION N/A 10/10/2018   Procedure: CARDIOVERSION;  Surgeon:  [name], MD;  Location: [facility] ENDOSCOPY;  Service: Cardiovascular;  Laterality: N/A;  . CHOLECYSTECTOMY    . PROSTATECTOMY    . REPLACEMENT TOTAL KNEE BILATERAL      Current Outpatient Medications  Medication Sig Dispense Refill  . arformoterol (BROVANA) 15 MCG/2ML NEBU Take 2 mLs (15 mcg total) by nebulization 2 (two) times daily. 60 mL 2  . atorvastatin (LIPITOR) 20 MG tablet TAKE 1 TABLET DAILY AT 6 P.M. (Patient taking differently: Take 20 mg by mouth daily. ) 90 tablet 1  . budesonide (PULMICORT) 0.5 MG/2ML nebulizer solution Take 2 mLs (0.5 mg total) by nebulization 2 (two) times daily. 60 mL 2  . diltiazem (CARDIZEM CD) 360 MG 24 hr capsule TAKE 1 CAPSULE DAILY (Patient taking differently: Take 360 mg by mouth daily. ) 90 capsule 1  . ELIQUIS 5 MG TABS tablet TAKE 1 TABLET TWICE A DAY (Patient taking differently: Take 5 mg by mouth 2 (two) times daily. ) 180 tablet 3  . fluticasone (FLONASE) 50 MCG/ACT nasal spray Place 2 sprays into both nostrils daily. 16 g 2  . furosemide (LASIX) 40 MG tablet Take 1 tablet (40 mg total) by mouth 2 (two) times daily. 180 tablet 1  . gabapentin (NEURONTIN) 300 MG capsule Take 2 capsules (600 mg total) by mouth at bedtime. 180 capsule 1  . glucose blood ([employer] TEST STRIPS) test strip Test 2x dailyDx: type 2 diabetes, uncontrolledfluctuating blood sugars 100 each 0  . glucose blood test strip Use to test blood sugars once daily as directed Dx:E11.65    . loratadine (CLARITIN) 10 MG tablet Take 1 tablet (10 mg total) by mouth daily. 30 tablet 0  . metFORMIN (GLUCOPHAGE) 500 MG tablet TAKE 1 TABLET TWICE A DAY WITH MEALS 180 tablet 1  . metoprolol tartrate (LOPRESSOR) 50 MG tablet Take 2 tablets (100 mg total) by mouth 2 (two) times daily. 180 tablet 1  . montelukast (SINGULAIR) 10 MG tablet Take 10 mg by mouth at bedtime.     . potassium chloride SA (KLOR-CON) 20 MEQ tablet Take 1 tablet (20 mEq total) by mouth daily. 30 tablet 0  . levalbuterol  (XOPENEX) 0.63 MG/3ML nebulizer solution Take 3 mLs (0.63 mg total) by nebulization every 8 (eight) hours as needed for up to 10 days for wheezing or shortness of breath. 3 mL 2   No current facility-administered medications for this visit.    Allergies:   Patient has no known allergies.   Social History:  The patient  reports that he has never smoked. He has never used smokeless tobacco. He reports current alcohol use of about 2.0 standard drinks of alcohol per week. He reports that he does not use drugs.   Family History:  The patient's  family history includes Cancer in his father and mother; Depression in his father and mother; Early death in his father and mother.  ROS:  Please see the history of present illness.   All other systems are personally reviewed and negative.    Exam:    Vital Signs:  There were no vitals taken for this visit.  no acute distress, no shortness of breath.  Labs/Other Tests and Data Reviewed:    Recent Labs: 07/24/2018: TSH 0.87 11/06/2018: NT-Pro BNP 1,062 01/05/2019: B Natriuretic Peptide 590.7 01/06/2019: ALT 21; BUN 18; Creatinine, Ser 1.04; Hemoglobin 11.4; Magnesium 1.8; Platelets 217; Potassium 4.5; Sodium 132   Wt Readings from Last 3 Encounters:  02/17/19 180 lb (81.6 kg)  02/06/19 186 lb 9.6 oz (84.6 kg)  01/27/19 189 lb 9.6 oz (86 kg)     Other studies personally reviewed: Additional studies/ records that were reviewed today include: ECG 01/05/2019 personally reviewed Review of the above records today demonstrates: Atrial fibrillation rate 86   ASSESSMENT & PLAN:    1.  Persistent atrial fibrillation: Currently on Eliquis.  Unfortunately failed to cardiovert August 2020.  We Kiron Osmun plan for repeat ablation.  Risks and benefits were discussed include bleeding, tamponade, heart block, stroke, damage surrounding organs.  He understands these risks and is agreed to the procedure.  CHA2DS2-VASc of 4.  2.  Chronic systolic heart failure: No  obvious volume overload.  3.  Hypertension: Usually well controlled.  Has not checked his blood pressure today.  4.  Hyperlipidemia: Statin per primary cardiology   COVID 19 screen The patient denies symptoms of COVID 19 at this time.  The importance of social distancing was discussed today.  Follow-up: 3 months  Current medicines are reviewed at length with the patient today.   The patient does not have concerns regarding his medicines.  The following changes were made today:  none  Labs/ tests ordered today include:  No orders of the defined types were placed in this encounter.    Patient Risk:  after full review of this patients clinical status, I feel that they are at moderate risk at this time.  Today, I have spent 10 minutes with the patient with telehealth technology discussing atrial fibrillation.    Signed, Xzavier Swinger Meredith Leeds, MD  03/03/2019 8:46 AM     [facility] [address] [place] [address] [phone] (office) [phone] (fax)

## 2019-03-06 ENCOUNTER — Telehealth: Payer: Self-pay | Admitting: Pulmonary Disease

## 2019-03-06 ENCOUNTER — Telehealth: Payer: Self-pay | Admitting: Cardiology

## 2019-03-06 ENCOUNTER — Telehealth (HOSPITAL_COMMUNITY): Payer: Self-pay | Admitting: Emergency Medicine

## 2019-03-06 NOTE — Telephone Encounter (Signed)
Reaching out to patient to offer assistance regarding upcoming cardiac imaging study; pt verbalizes understanding of appt date/time, parking situation and where to check in, pre-test NPO status and medications ordered, and verified current allergies; name and call back number provided for further questions should they arise Kallen Delatorre RN Navigator Cardiac Imaging [facility] [phone] office [phone] cell 

## 2019-03-06 NOTE — Telephone Encounter (Signed)
Telephone call to patient. Informed him it would be best to all Dr Curt Bears office since they are doing the ablation( not cath) and that they have different protocols on Eliquis. Pt states will call Camnitz's office for advice.

## 2019-03-06 NOTE — Telephone Encounter (Signed)
Attempted to reach pt at number given, no answer, male voicemail, did not leave message as I was unsure if correct number. Left message pt son's voicemail. Informed to continue taking Eliquis for procedure. Advised I would follow up next week to verbally confirm understanding of instructions.

## 2019-03-06 NOTE — Telephone Encounter (Signed)
Reviewed allergy office visit note from 12/30. On Breo Given prednisone

## 2019-03-06 NOTE — Telephone Encounter (Signed)
Patient is having a catherization on 1/14, should he stop his eliquis prior to the cath? Please advise

## 2019-03-06 NOTE — Telephone Encounter (Signed)
See telephone call from this morning for documentation on this matter

## 2019-03-09 ENCOUNTER — Ambulatory Visit (HOSPITAL_COMMUNITY)
Admission: RE | Admit: 2019-03-09 | Discharge: 2019-03-09 | Disposition: A | Discharge status: Home / Self Care | Payer: Medicare Other | Source: Ambulatory Visit | Attending: Cardiology | Admitting: Cardiology

## 2019-03-09 ENCOUNTER — Other Ambulatory Visit: Payer: Self-pay | Admitting: Internal Medicine

## 2019-03-09 ENCOUNTER — Other Ambulatory Visit (HOSPITAL_COMMUNITY)
Admission: RE | Admit: 2019-03-09 | Discharge: 2019-03-09 | Disposition: A | Discharge status: Home / Self Care | Payer: Medicare Other | Source: Ambulatory Visit | Attending: Cardiology | Admitting: Cardiology

## 2019-03-09 ENCOUNTER — Other Ambulatory Visit: Payer: Self-pay

## 2019-03-09 ENCOUNTER — Other Ambulatory Visit: Payer: Medicare Other | Admitting: *Deleted

## 2019-03-09 DIAGNOSIS — Z20822 Contact with and (suspected) exposure to covid-19: Secondary | ICD-10-CM | POA: Insufficient documentation

## 2019-03-09 DIAGNOSIS — Z01812 Encounter for preprocedural laboratory examination: Secondary | ICD-10-CM

## 2019-03-09 DIAGNOSIS — I4819 Other persistent atrial fibrillation: Secondary | ICD-10-CM

## 2019-03-09 LAB — POCT I-STAT CREATININE: Creatinine, Ser: 0.9 mg/dL (ref 0.61–1.24)

## 2019-03-09 LAB — BASIC METABOLIC PANEL
BUN/Creatinine Ratio: 11 (ref 10–24)
BUN: 11 mg/dL (ref 8–27)
CO2: 23 mmol/L (ref 20–29)
Calcium: 9.3 mg/dL (ref 8.6–10.2)
Chloride: 98 mmol/L (ref 96–106)
Creatinine, Ser: 1.01 mg/dL (ref 0.76–1.27)
GFR calc Af Amer: 83 mL/min/{1.73_m2} (ref >59)
GFR calc non Af Amer: 72 mL/min/{1.73_m2} (ref >59)
Glucose: 139 mg/dL — ABNORMAL HIGH (ref 65–99)
Potassium: 3.9 mmol/L (ref 3.5–5.2)
Sodium: 139 mmol/L (ref 134–144)

## 2019-03-09 LAB — CBC
Hematocrit: 34.5 % — ABNORMAL LOW (ref 37.5–51.0)
Hemoglobin: 11.7 g/dL — ABNORMAL LOW (ref 13.0–17.7)
MCH: 29.1 pg (ref 26.6–33.0)
MCHC: 33.9 g/dL (ref 31.5–35.7)
MCV: 86 fL (ref 79–97)
Platelets: 230 10*3/uL (ref 150–450)
RBC: 4.02 x10E6/uL — ABNORMAL LOW (ref 4.14–5.80)
RDW: 15 % (ref 11.6–15.4)
WBC: 6.2 10*3/uL (ref 3.4–10.8)

## 2019-03-09 IMAGING — CT CT HEART MORPH/PULM VEIN W/ CM & W/O CA SCORE
2 of 8 series · 8 of 20 positions shown, 9 images · IV contrast (APPLIED)
Comparison: None
COMPARISON: None.
COMPARISON: None

Addendum:
CLINICAL DATA: Pre Ablation

EXAM:
Cardiac Gated CTA
TECHNIQUE: The patient was scanned on a Siemens Force [REDACTED]ice scanner. Gantry
rotation speed was 250 msec with a temporal resolution of 66 msec. A
prospective scan was triggered in the ascending thoracic aorta at
140 HU's Data sets were reconstructed with full mA between 35% and
75% of the R-R interval Images were reviewed using VRT, MIP and MPR
modes. Double oblique images were used to measure the PV diameter
and areas. The patient received 80 cc of contrast at 5 cc/sec
CONTRAST:  Isovue 370 total 80 cc

[Series 6: best diast · axial · 0.39mm/px · z∈[-170,-92]mm · 4 of 328 slices shown, 5 images]
[im 66/328  vessel]
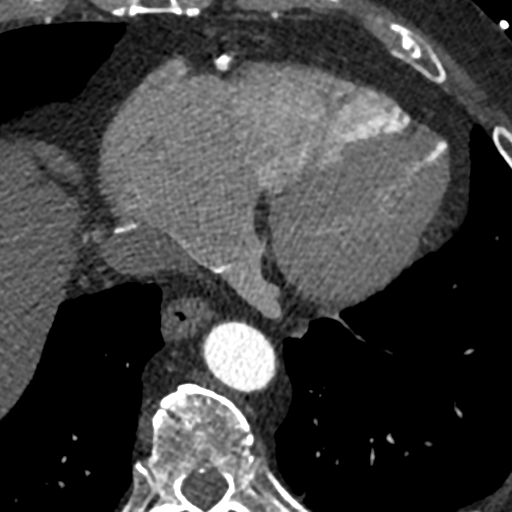
[im 66/328  lung]
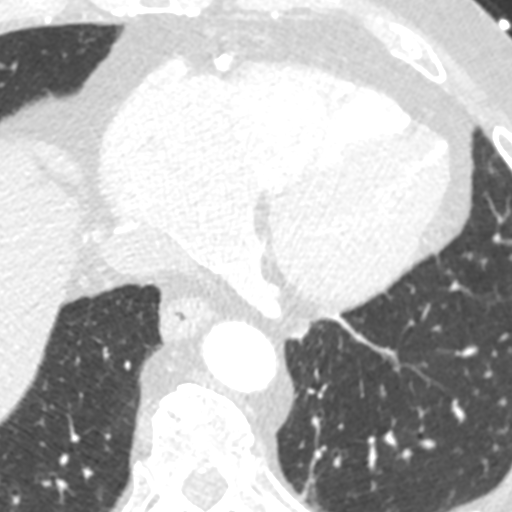
[im 131/328  vessel]
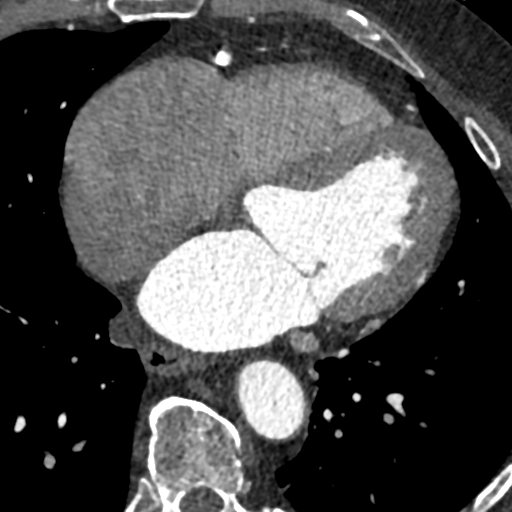
[im 197/328  vessel]
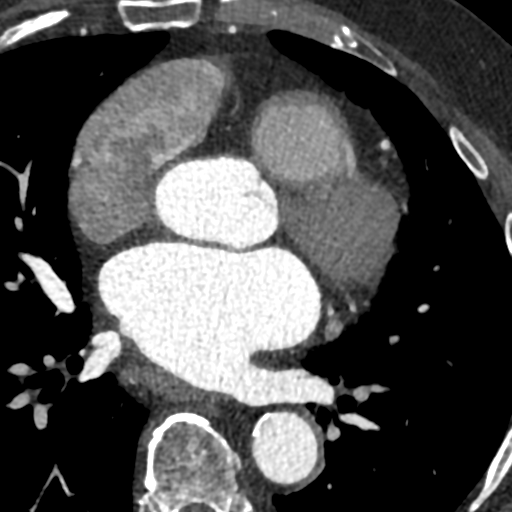
[im 262/328  vessel]
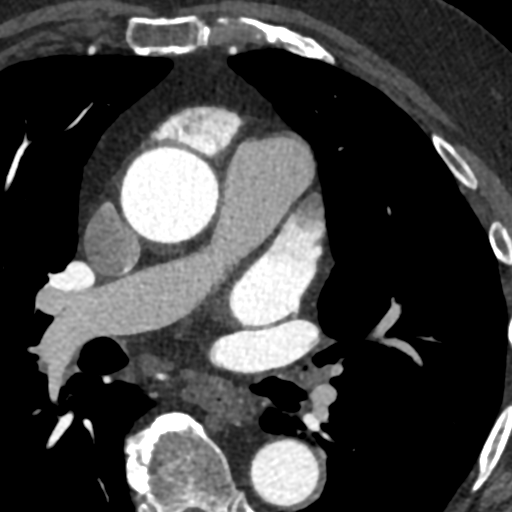

[Series 7: +300 ms · axial · 0.39mm/px · z∈[-170,-92]mm · 4 of 328 slices shown]
[im 66/328  vessel]
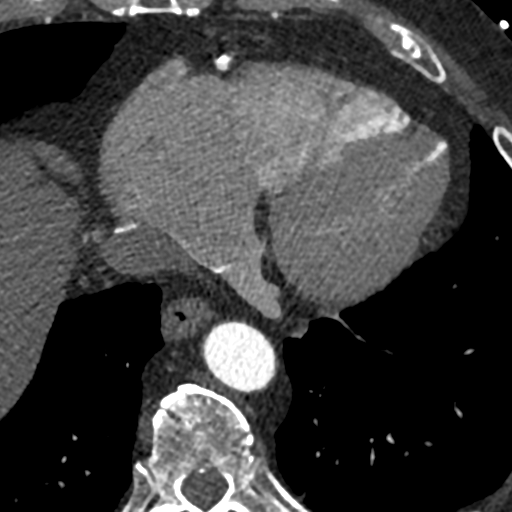
[im 131/328  vessel]
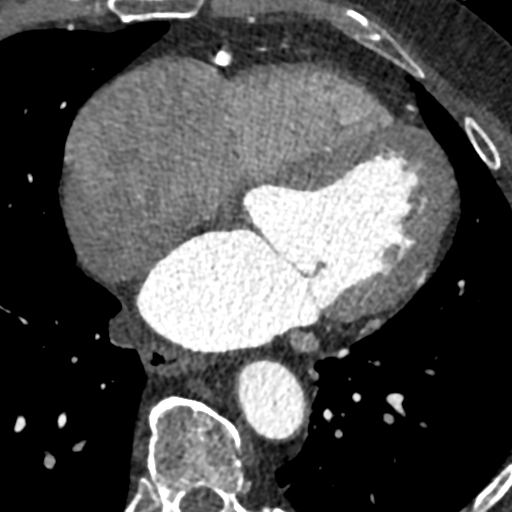
[im 197/328  vessel]
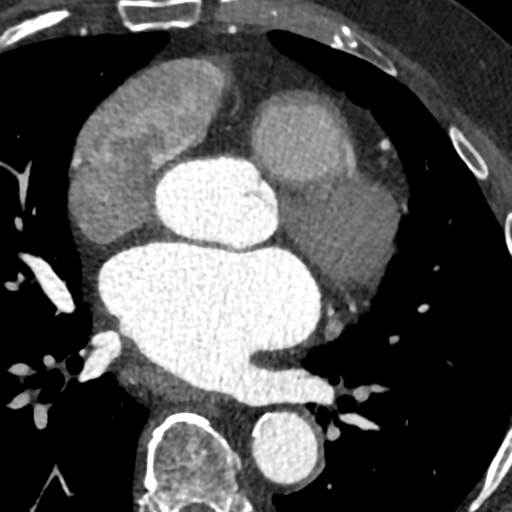
[im 262/328  vessel]
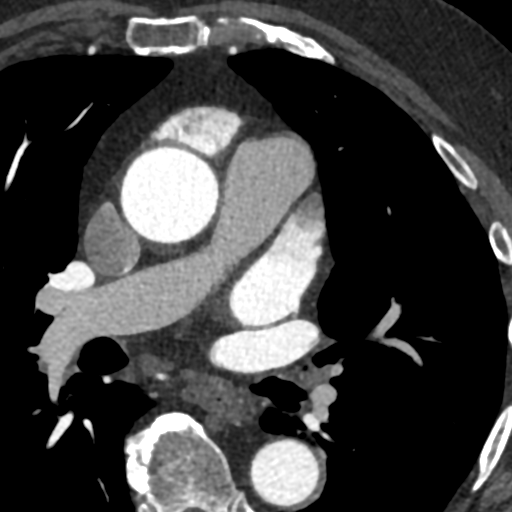

[8 of 20 positions shown; findings below may reference images not displayed]

FINDINGS: Moderate ANDREASEN, normal LA size. No ASD/PFO Mild layering artifact in
ANDREASEN cleared on delayed images No ANDREASEN thrombus No pericardial
effusion

Calcium score [W9] noted in all 3 major epicardial vessels

Aortic root 2.7 cm

LUPV:  Ostium 19.4 mm   area 2.48 cm2

LLPV:   Ostium 18.2 mm   area 2.43 cm2

RUPV:  Ostium 17.2 mm  area 3.7 cm2

RLPV:  Ostium 17.6 mm  area 2.56 cm2
IMPRESSION: 1. Calcium score [W9] which is 94 th percentile for age Unable to
evaluate CAD for obstruction due to afib. Consider f/u myovue study

2.  Upper normal aortic root size 3.7 cm

3.  Moderate ANDREASEN no ANDREASEN thrombus

4.  Normal PV anatomy with no stenosis from previous ablation

5.  No pericardial effusion

6.  Esophagus courses closest to the LLPV ostium

EXAM:
OVER-READ INTERPRETATION  CT CHEST

The following report is an over-read performed by radiologist Dr.
over-read does not include interpretation of cardiac or coronary
anatomy or pathology. The interpretation by the cardiologist is
attached.
FINDINGS: Mediastinal lymph nodes are not enlarged by CT size criteria.
Visualized lungs are clear. Subcentimeter low-attenuation lesions in
the liver are too small to characterize. Degenerative changes are
seen in the spine.
IMPRESSION: No acute extracardiac findings.

*** End of Addendum ***
FINDINGS: Moderate ANDREASEN, normal LA size. No ASD/PFO Mild layering artifact in
ANDREASEN cleared on delayed images No ANDREASEN thrombus No pericardial
effusion

Calcium score [W9] noted in all 3 major epicardial vessels

Aortic root 2.7 cm

LUPV:  Ostium 19.4 mm   area 2.48 cm2

LLPV:   Ostium 18.2 mm   area 2.43 cm2

RUPV:  Ostium 17.2 mm  area 3.7 cm2

RLPV:  Ostium 17.6 mm  area 2.56 cm2
IMPRESSION: 1. Calcium score [W9] which is 94 th percentile for age Unable to
evaluate CAD for obstruction due to afib. Consider f/u myovue study

2.  Upper normal aortic root size 3.7 cm

3.  Moderate ANDREASEN no ANDREASEN thrombus

4.  Normal PV anatomy with no stenosis from previous ablation

5.  No pericardial effusion

6.  Esophagus courses closest to the LLPV ostium

## 2019-03-09 MED ORDER — IOHEXOL 350 MG/ML SOLN
100.0000 mL | Freq: Once | INTRAVENOUS | Status: AC | PRN
  Administered 2019-03-09: 100 mL via INTRAVENOUS

## 2019-03-09 NOTE — Telephone Encounter (Signed)
Left another detailed message on son's voicemail asking to call me to verify they understood not to stop Eliquis

## 2019-03-09 NOTE — Telephone Encounter (Signed)
Medication Refill - Medication: Gabapentin 300 mg 90 day supply  Has the patient contacted their pharmacy? No. (Agent: If no, request that the patient contact the pharmacy for the refill.) (Agent: If yes, when and what did the pharmacy advise?)  Preferred Pharmacy (with phone number or street name[facility]  Agent: Please be advised that RX refills may take up to 3 business days. We ask that you follow-up with your pharmacy.  CB#  [phone]

## 2019-03-10 ENCOUNTER — Telehealth: Payer: Self-pay

## 2019-03-10 LAB — NOVEL CORONAVIRUS, NAA (HOSP ORDER, SEND-OUT TO REF LAB; TAT 18-24 HRS): SARS-CoV-2, NAA: NOT DETECTED

## 2019-03-10 MED ORDER — GABAPENTIN 300 MG PO CAPS: 600.0000 mg | ORAL_CAPSULE | Freq: Every day | ORAL | 1 refills | Status: DC

## 2019-03-10 NOTE — Telephone Encounter (Signed)
-----   Message from Will Meredith Leeds, MD sent at 03/10/2019  9:39 AM EST ----- Stable labs

## 2019-03-10 NOTE — Telephone Encounter (Signed)
The patient has been notified of the result and verbalized understanding.  All questions (if any) were answered. Wilma Flavin, RN 03/10/2019 4:46 PM

## 2019-03-10 NOTE — Telephone Encounter (Signed)
Followed up w/ pt to answer any questions and confirm he has not stopped his blood thinner.  Pt reports he has not stopped Eliquis and understands to only hold this medication the morning of ablation. Pt advised NPO after MN night before ablation.  (answered per question) Patient verbalized understanding and agreeable to plan.

## 2019-03-10 NOTE — Telephone Encounter (Signed)
I spoke with pt and told him to continue Eliquis. He confirms he has been taking. He has a question regarding procedure instructions which say do not take any medications the morning of the procedure.  Will ask Dr Macky Lower nurse to contact patient to discuss.

## 2019-03-10 NOTE — Telephone Encounter (Signed)
The patient has been notified of the negative COVID result and verbalized understanding.  All questions (if any) were answered. Wilma Flavin, RN 03/10/2019 4:51 PM

## 2019-03-10 NOTE — Telephone Encounter (Signed)
Patient returning call.

## 2019-03-10 NOTE — Telephone Encounter (Signed)
-----   Message from Will Meredith Leeds, MD sent at 03/10/2019  9:39 AM EST ----- Stable preop labs

## 2019-03-12 ENCOUNTER — Encounter (HOSPITAL_COMMUNITY)
Admission: RE | Disposition: A | Discharge status: Home / Self Care | Payer: Self-pay | Source: Home / Self Care | Attending: Cardiology

## 2019-03-12 ENCOUNTER — Ambulatory Visit (HOSPITAL_COMMUNITY): Payer: Medicare Other | Admitting: Certified Registered"

## 2019-03-12 ENCOUNTER — Ambulatory Visit (HOSPITAL_COMMUNITY)
Admission: RE | Admit: 2019-03-12 | Discharge: 2019-03-12 | Disposition: A | Discharge status: Home / Self Care | Payer: Medicare Other | Attending: Cardiology | Admitting: Cardiology

## 2019-03-12 ENCOUNTER — Encounter (HOSPITAL_COMMUNITY): Payer: Self-pay | Admitting: Cardiology

## 2019-03-12 DIAGNOSIS — E785 Hyperlipidemia, unspecified: Secondary | ICD-10-CM | POA: Insufficient documentation

## 2019-03-12 DIAGNOSIS — I110 Hypertensive heart disease with heart failure: Secondary | ICD-10-CM | POA: Diagnosis not present

## 2019-03-12 DIAGNOSIS — I5022 Chronic systolic (congestive) heart failure: Secondary | ICD-10-CM | POA: Diagnosis not present

## 2019-03-12 DIAGNOSIS — I4819 Other persistent atrial fibrillation: Secondary | ICD-10-CM | POA: Insufficient documentation

## 2019-03-12 DIAGNOSIS — Z7901 Long term (current) use of anticoagulants: Secondary | ICD-10-CM | POA: Insufficient documentation

## 2019-03-12 DIAGNOSIS — E1140 Type 2 diabetes mellitus with diabetic neuropathy, unspecified: Secondary | ICD-10-CM | POA: Diagnosis not present

## 2019-03-12 DIAGNOSIS — Z7984 Long term (current) use of oral hypoglycemic drugs: Secondary | ICD-10-CM | POA: Diagnosis not present

## 2019-03-12 DIAGNOSIS — Z79899 Other long term (current) drug therapy: Secondary | ICD-10-CM | POA: Diagnosis not present

## 2019-03-12 HISTORY — PX: ATRIAL FIBRILLATION ABLATION: EP1191

## 2019-03-12 LAB — POCT ACTIVATED CLOTTING TIME
Activated Clotting Time: 301 seconds
Activated Clotting Time: 312 seconds
Activated Clotting Time: 329 seconds
Activated Clotting Time: 334 seconds

## 2019-03-12 LAB — GLUCOSE, CAPILLARY
Glucose-Capillary: 120 mg/dL — ABNORMAL HIGH (ref 70–99)
Glucose-Capillary: 166 mg/dL — ABNORMAL HIGH (ref 70–99)

## 2019-03-12 SURGERY — ATRIAL FIBRILLATION ABLATION
Anesthesia: General

## 2019-03-12 MED ORDER — HEPARIN (PORCINE) IN NACL 1000-0.9 UT/500ML-% IV SOLN
INTRAVENOUS | Status: AC
  Filled 2019-03-12: qty 500

## 2019-03-12 MED ORDER — HEPARIN SODIUM (PORCINE) 1000 UNIT/ML IJ SOLN
INTRAMUSCULAR | Status: AC
  Filled 2019-03-12: qty 1

## 2019-03-12 MED ORDER — DOBUTAMINE IN D5W 4-5 MG/ML-% IV SOLN
INTRAVENOUS | Status: DC | PRN
  Administered 2019-03-12: 20 ug/kg/min via INTRAVENOUS

## 2019-03-12 MED ORDER — HEPARIN (PORCINE) IN NACL 1000-0.9 UT/500ML-% IV SOLN
INTRAVENOUS | Status: DC | PRN
  Administered 2019-03-12 (×5): 500 mL

## 2019-03-12 MED ORDER — PROTAMINE SULFATE 10 MG/ML IV SOLN
INTRAVENOUS | Status: DC | PRN
  Administered 2019-03-12: 50 mg via INTRAVENOUS

## 2019-03-12 MED ORDER — BUPIVACAINE HCL (PF) 0.25 % IJ SOLN
INTRAMUSCULAR | Status: AC
  Filled 2019-03-12: qty 30

## 2019-03-12 MED ORDER — PROPOFOL 10 MG/ML IV BOLUS
INTRAVENOUS | Status: DC | PRN
  Administered 2019-03-12: 150 mg via INTRAVENOUS
  Administered 2019-03-12: 40 mg via INTRAVENOUS

## 2019-03-12 MED ORDER — HEPARIN SODIUM (PORCINE) 1000 UNIT/ML IJ SOLN
INTRAMUSCULAR | Status: DC | PRN
  Administered 2019-03-12: 1000 [IU] via INTRAVENOUS

## 2019-03-12 MED ORDER — FENTANYL CITRATE (PF) 250 MCG/5ML IJ SOLN
INTRAMUSCULAR | Status: DC | PRN
  Administered 2019-03-12 (×3): 50 ug via INTRAVENOUS

## 2019-03-12 MED ORDER — ONDANSETRON HCL 4 MG/2ML IJ SOLN: 4.0000 mg | Freq: Four times a day (QID) | INTRAMUSCULAR | Status: DC | PRN

## 2019-03-12 MED ORDER — ONDANSETRON HCL 4 MG/2ML IJ SOLN
INTRAMUSCULAR | Status: DC | PRN
  Administered 2019-03-12: 4 mg via INTRAVENOUS

## 2019-03-12 MED ORDER — BUPIVACAINE HCL (PF) 0.25 % IJ SOLN
INTRAMUSCULAR | Status: DC | PRN
  Administered 2019-03-12: 30 mL

## 2019-03-12 MED ORDER — PHENYLEPHRINE HCL-NACL 10-0.9 MG/250ML-% IV SOLN
INTRAVENOUS | Status: DC | PRN
  Administered 2019-03-12: 20 ug/min via INTRAVENOUS

## 2019-03-12 MED ORDER — HEPARIN SODIUM (PORCINE) 1000 UNIT/ML IJ SOLN
INTRAMUSCULAR | Status: DC | PRN
  Administered 2019-03-12: 2000 [IU] via INTRAVENOUS
  Administered 2019-03-12: 3000 [IU] via INTRAVENOUS
  Administered 2019-03-12: 14000 [IU] via INTRAVENOUS
  Administered 2019-03-12: 3000 [IU] via INTRAVENOUS

## 2019-03-12 MED ORDER — LIDOCAINE 2% (20 MG/ML) 5 ML SYRINGE
INTRAMUSCULAR | Status: DC | PRN
  Administered 2019-03-12: 40 mg via INTRAVENOUS

## 2019-03-12 MED ORDER — SODIUM CHLORIDE 0.9% FLUSH: 3.0000 mL | Freq: Two times a day (BID) | INTRAVENOUS | Status: DC

## 2019-03-12 MED ORDER — SODIUM CHLORIDE 0.9 % IV SOLN: 250.0000 mL | INTRAVENOUS | Status: DC | PRN

## 2019-03-12 MED ORDER — DOBUTAMINE IN D5W 4-5 MG/ML-% IV SOLN
INTRAVENOUS | Status: AC
  Filled 2019-03-12: qty 250

## 2019-03-12 MED ORDER — SUGAMMADEX SODIUM 200 MG/2ML IV SOLN
INTRAVENOUS | Status: DC | PRN
  Administered 2019-03-12: 200 mg via INTRAVENOUS

## 2019-03-12 MED ORDER — SODIUM CHLORIDE 0.9 % IV SOLN: INTRAVENOUS | Status: DC

## 2019-03-12 MED ORDER — ACETAMINOPHEN 325 MG PO TABS
650.0000 mg | ORAL_TABLET | ORAL | Status: DC | PRN
  Filled 2019-03-12: qty 2

## 2019-03-12 MED ORDER — SODIUM CHLORIDE 0.9% FLUSH: 3.0000 mL | INTRAVENOUS | Status: DC | PRN

## 2019-03-12 MED ORDER — ROCURONIUM BROMIDE 10 MG/ML (PF) SYRINGE
PREFILLED_SYRINGE | INTRAVENOUS | Status: DC | PRN
  Administered 2019-03-12: 10 mg via INTRAVENOUS
  Administered 2019-03-12: 50 mg via INTRAVENOUS

## 2019-03-12 MED ORDER — DEXAMETHASONE SODIUM PHOSPHATE 10 MG/ML IJ SOLN
INTRAMUSCULAR | Status: DC | PRN
  Administered 2019-03-12: 5 mg via INTRAVENOUS

## 2019-03-12 SURGICAL SUPPLY — 22 items
BLANKET WARM UNDERBOD FULL ACC (MISCELLANEOUS) ×2 IMPLANT
CATH MAPPNG PENTARAY F 2-6-2MM (CATHETERS) ×1 IMPLANT
CATH SMTCH THERMOCOOL SF DF (CATHETERS) ×2 IMPLANT
CATH SOUNDSTAR ECO 8FR (CATHETERS) ×2 IMPLANT
CATH WEBSTER BI DIR CS D-F CRV (CATHETERS) ×2 IMPLANT
COVER SWIFTLINK CONNECTOR (BAG) ×2 IMPLANT
DEVICE CLOSURE PERCLS PRGLD 6F (VASCULAR PRODUCTS) ×4 IMPLANT
PACK EP LATEX FREE (CUSTOM PROCEDURE TRAY) ×1
PACK EP LF (CUSTOM PROCEDURE TRAY) ×1 IMPLANT
PAD PRO RADIOLUCENT 2001M-C (PAD) ×2 IMPLANT
PATCH CARTO3 (PAD) ×2 IMPLANT
PENTARAY F 2-6-2MM (CATHETERS) ×2
PERCLOSE PROGLIDE 6F (VASCULAR PRODUCTS) ×8
SHEATH BAYLIS SUREFLEX  M 8.5 (SHEATH) ×1
SHEATH BAYLIS SUREFLEX M 8.5 (SHEATH) ×1 IMPLANT
SHEATH BAYLIS TRANSSEPTAL 98CM (NEEDLE) ×2 IMPLANT
SHEATH CARTO VIZIGO SM CVD (SHEATH) ×2 IMPLANT
SHEATH PINNACLE 7F 10CM (SHEATH) ×2 IMPLANT
SHEATH PINNACLE 8F 10CM (SHEATH) ×4 IMPLANT
SHEATH PINNACLE 9F 10CM (SHEATH) ×2 IMPLANT
SHEATH PROBE COVER 6X72 (BAG) ×2 IMPLANT
TUBING SMART ABLATE COOLFLOW (TUBING) ×2 IMPLANT

## 2019-03-12 NOTE — Anesthesia Procedure Notes (Signed)
Procedure Name: Intubation Date/Time: 03/12/2019 7:43 AM Performed by: Gaylene Brooks, CRNA Pre-anesthesia Checklist: Patient identified, Emergency Drugs available, Suction available and Patient being monitored Patient Re-evaluated:Patient Re-evaluated prior to induction Oxygen Delivery Method: Circle System Utilized Preoxygenation: Pre-oxygenation with 100% oxygen Induction Type: IV induction Ventilation: Mask ventilation without difficulty and Oral airway inserted - appropriate to patient size Laryngoscope Size: [name] Heck and 2 Grade View: Grade II Tube type: Oral Tube size: 7.5 mm Number of attempts: 1 Airway Equipment and Method: Stylet and Oral airway Placement Confirmation: ETT inserted through vocal cords under direct vision,  positive ETCO2 and breath sounds checked- equal and bilateral Secured at: 22 cm Tube secured with: Tape Dental Injury: Teeth and Oropharynx as per pre-operative assessment

## 2019-03-12 NOTE — Anesthesia Preprocedure Evaluation (Signed)
Anesthesia Evaluation  Patient identified by MRN, date of birth, ID band Patient awake    Reviewed: Allergy & Precautions, H&P , NPO status , Patient's Chart, lab work & pertinent test results  Airway Mallampati: II   Neck ROM: full    Dental   Pulmonary neg pulmonary ROS,    breath sounds clear to auscultation       Cardiovascular hypertension, +CHF  + dysrhythmias Atrial Fibrillation  Rhythm:irregular Rate:Normal     Neuro/Psych  Neuromuscular disease    GI/Hepatic   Endo/Other  diabetes, Type 2  Renal/GU      Musculoskeletal   Abdominal   Peds  Hematology   Anesthesia Other Findings   Reproductive/Obstetrics                             Anesthesia Physical Anesthesia Plan  ASA: III  Anesthesia Plan: General   Post-op Pain Management:    Induction: Intravenous  PONV Risk Score and Plan: 2 and Ondansetron, Dexamethasone and Treatment may vary due to age or medical condition  Airway Management Planned: Oral ETT  Additional Equipment:   Intra-op Plan:   Post-operative Plan: Extubation in OR  Informed Consent: I have reviewed the patients History and Physical, chart, labs and discussed the procedure including the risks, benefits and alternatives for the proposed anesthesia with the patient or authorized representative who has indicated his/her understanding and acceptance.       Plan Discussed with: CRNA, Anesthesiologist and Surgeon  Anesthesia Plan Comments:         Anesthesia Quick Evaluation

## 2019-03-12 NOTE — Discharge Instructions (Signed)
Post procedure care instructions No driving for 4 days. No lifting over 5 lbs for 1 week. No vigorous or sexual activity for 1 week. You may return to work/your usual activities on 03/19/19. Keep procedure site clean & dry. If you notice increased pain, swelling, bleeding or pus, call/return!  You may shower, but no soaking baths/hot tubs/pools for 1 week.    Femoral Site Care This sheet gives you information about how to care for yourself after your procedure. Your health care provider may also give you more specific instructions. If you have problems or questions, contact your health care provider. What can I expect after the procedure? After the procedure, it is common to have:  Bruising that usually fades within 1-2 weeks.  Tenderness at the site. Follow these instructions at home: Wound care  Follow instructions from your health care provider about how to take care of your insertion site. Make sure you: ? Wash your hands with soap and water before you change your bandage (dressing). If soap and water are not available, use hand sanitizer. ? Change your dressing as told by your health care provider. ? Leave stitches (sutures), skin glue, or adhesive strips in place. These skin closures may need to stay in place for 2 weeks or longer. If adhesive strip edges start to loosen and curl up, you may trim the loose edges. Do not remove adhesive strips completely unless your health care provider tells you to do that.  Do not take baths, swim, or use a hot tub until your health care provider approves.  You may shower 24-48 hours after the procedure or as told by your health care provider. ? Gently wash the site with plain soap and water. ? Pat the area dry with a clean towel. ? Do not rub the site. This may cause bleeding.  Do not apply powder or lotion to the site. Keep the site clean and dry.  Check your femoral site every day for signs of infection. Check for: ? Redness, swelling, or  pain. ? Fluid or blood. ? Warmth. ? Pus or a bad smell. Activity  For the first 2-3 days after your procedure, or as long as directed: ? Avoid climbing stairs as much as possible. ? Do not squat.  Do not lift anything that is heavier than 10 lb (4.5 kg), or the limit that you are told, until your health care provider says that it is safe.  Rest as directed. ? Avoid sitting for a long time without moving. Get up to take short walks every 1-2 hours.  Do not drive for 24 hours if you were given a medicine to help you relax (sedative). General instructions  Take over-the-counter and prescription medicines only as told by your health care provider.  Keep all follow-up visits as told by your health care provider. This is important. Contact a health care provider if you have:  A fever or chills.  You have redness, swelling, or pain around your insertion site. Get help right away if:  The catheter insertion area swells very fast.  You pass out.  You suddenly start to sweat or your skin gets clammy.  The catheter insertion area is bleeding, and the bleeding does not stop when you hold steady pressure on the area.  The area near or just beyond the catheter insertion site becomes pale, cool, tingly, or numb. These symptoms may represent a serious problem that is an emergency. Do not wait to see if the symptoms will go  away. Get medical help right away. Call your local emergency services (911 in the [place] Do not drive yourself to the hospital. Summary  After the procedure, it is common to have bruising that usually fades within 1-2 weeks.  Check your femoral site every day for signs of infection.  Do not lift anything that is heavier than 10 lb (4.5 kg), or the limit that you are told, until your health care provider says that it is safe. This information is not intended to replace advice given to you by your health care provider. Make sure you discuss any questions you have with  your health care provider. Document Revised: 02/25/2017 Document Reviewed: 02/25/2017 Elsevier Patient Education  2020 [employer].

## 2019-03-12 NOTE — Progress Notes (Addendum)
Client up and walked and tolerated well; bilat groins stable, no bleeding or hematoma; Dr Curt Bears in and ok to d/c home

## 2019-03-12 NOTE — H&P (Signed)
Charles Lloyd. has presented today for surgery, with the diagnosis of atrial fibrillation.  The various methods of treatment have been discussed with the patient and family. After consideration of risks, benefits and other options for treatment, the patient has consented to  Procedure(s): Catheter ablation as a surgical intervention .  Risks include but not limited to bleeding, tamponade, heart block, stroke, damage to surrounding organs, among others. The patient's history has been reviewed, patient examined, no change in status, stable for surgery.  I have reviewed the patient's chart and labs.  Questions were answered to the patient's satisfaction.    Kippy Melena Curt Bears, MD 03/12/2019 7:08 AM

## 2019-03-12 NOTE — Transfer of Care (Signed)
Immediate Anesthesia Transfer of Care Note  Patient: Charles Lloyd.  Procedure(s) Performed: ATRIAL FIBRILLATION ABLATION (N/A )  Patient Location: Cath Lab  Anesthesia Type:General  Level of Consciousness: awake, alert  and oriented  Airway & Oxygen Therapy: Patient Spontanous Breathing and Patient connected to nasal cannula oxygen  Post-op Assessment: Report given to RN, Post -op Vital signs reviewed and stable and Patient moving all extremities X 4  Post vital signs: Reviewed and stable  Last Vitals:  Vitals Value Taken Time  BP    Temp    Pulse    Resp    SpO2      Last Pain:  Vitals:   03/12/19 0547  PainSc: 0-No pain         Complications: No apparent anesthesia complications

## 2019-03-13 ENCOUNTER — Other Ambulatory Visit: Payer: Self-pay | Admitting: Internal Medicine

## 2019-03-13 NOTE — Telephone Encounter (Signed)
Refill went to [facility].  Resent to [facility].

## 2019-03-13 NOTE — Telephone Encounter (Signed)
Patient is calling to check the status of his medication request.  Patient is almost out of the medication.  Patient would like to know if he can get a small supply sent to his local pharmacy.  Please advise.

## 2019-03-13 NOTE — Anesthesia Postprocedure Evaluation (Signed)
Anesthesia Post Note  Patient: [place].  Procedure(s) Performed: ATRIAL FIBRILLATION ABLATION (N/A )     Patient location during evaluation: Cath Lab Anesthesia Type: General Level of consciousness: awake and alert Pain management: pain level controlled Vital Signs Assessment: post-procedure vital signs reviewed and stable Respiratory status: spontaneous breathing, nonlabored ventilation, respiratory function stable and patient connected to nasal cannula oxygen Cardiovascular status: blood pressure returned to baseline and stable Postop Assessment: no apparent nausea or vomiting Anesthetic complications: no    Last Vitals:  Vitals:   03/12/19 1445 03/12/19 1515  BP: 131/75 (!) 145/85  Pulse: 100 99  Resp:    Temp:    SpO2: 95% 96%    Last Pain:  Vitals:   03/12/19 1230  TempSrc:   PainSc: 0-No pain                 Isley Zinni S

## 2019-03-24 ENCOUNTER — Other Ambulatory Visit: Payer: Self-pay | Admitting: Cardiology

## 2019-03-24 DIAGNOSIS — R0602 Shortness of breath: Secondary | ICD-10-CM

## 2019-04-09 ENCOUNTER — Ambulatory Visit (HOSPITAL_COMMUNITY)
Admission: RE | Admit: 2019-04-09 | Discharge: 2019-04-09 | Disposition: A | Discharge status: Home / Self Care | Payer: Medicare Other | Source: Ambulatory Visit | Attending: Physician Assistant | Admitting: Physician Assistant

## 2019-04-09 ENCOUNTER — Encounter (HOSPITAL_COMMUNITY): Payer: Self-pay | Admitting: Physician Assistant

## 2019-04-09 ENCOUNTER — Other Ambulatory Visit: Payer: Self-pay

## 2019-04-09 VITALS — BP 134/74 | HR 64 | Ht 70.0 in | Wt 194.4 lb

## 2019-04-09 DIAGNOSIS — E785 Hyperlipidemia, unspecified: Secondary | ICD-10-CM | POA: Insufficient documentation

## 2019-04-09 DIAGNOSIS — Z85828 Personal history of other malignant neoplasm of skin: Secondary | ICD-10-CM | POA: Diagnosis not present

## 2019-04-09 DIAGNOSIS — E663 Overweight: Secondary | ICD-10-CM | POA: Diagnosis not present

## 2019-04-09 DIAGNOSIS — Z79899 Other long term (current) drug therapy: Secondary | ICD-10-CM | POA: Insufficient documentation

## 2019-04-09 DIAGNOSIS — E119 Type 2 diabetes mellitus without complications: Secondary | ICD-10-CM | POA: Diagnosis not present

## 2019-04-09 DIAGNOSIS — I4819 Other persistent atrial fibrillation: Secondary | ICD-10-CM

## 2019-04-09 DIAGNOSIS — E1142 Type 2 diabetes mellitus with diabetic polyneuropathy: Secondary | ICD-10-CM | POA: Insufficient documentation

## 2019-04-09 DIAGNOSIS — D6869 Other thrombophilia: Secondary | ICD-10-CM

## 2019-04-09 DIAGNOSIS — Z6827 Body mass index (BMI) 27.0-27.9, adult: Secondary | ICD-10-CM | POA: Diagnosis not present

## 2019-04-09 DIAGNOSIS — I5022 Chronic systolic (congestive) heart failure: Secondary | ICD-10-CM | POA: Diagnosis not present

## 2019-04-09 DIAGNOSIS — R9431 Abnormal electrocardiogram [ECG] [EKG]: Secondary | ICD-10-CM | POA: Insufficient documentation

## 2019-04-09 DIAGNOSIS — Z7901 Long term (current) use of anticoagulants: Secondary | ICD-10-CM | POA: Diagnosis not present

## 2019-04-09 DIAGNOSIS — I110 Hypertensive heart disease with heart failure: Secondary | ICD-10-CM | POA: Insufficient documentation

## 2019-04-09 DIAGNOSIS — Z7984 Long term (current) use of oral hypoglycemic drugs: Secondary | ICD-10-CM | POA: Insufficient documentation

## 2019-04-09 HISTORY — DX: Other thrombophilia: D68.69

## 2019-04-09 LAB — BASIC METABOLIC PANEL
Anion gap: 10 (ref 5–15)
BUN: 11 mg/dL (ref 8–23)
CO2: 27 mmol/L (ref 22–32)
Calcium: 9.1 mg/dL (ref 8.9–10.3)
Chloride: 99 mmol/L (ref 98–111)
Creatinine, Ser: 0.98 mg/dL (ref 0.61–1.24)
GFR calc Af Amer: >60 mL/min (ref >60)
GFR calc non Af Amer: >60 mL/min (ref >60)
Glucose, Bld: 110 mg/dL — ABNORMAL HIGH (ref 70–99)
Potassium: 3.6 mmol/L (ref 3.5–5.1)
Sodium: 136 mmol/L (ref 135–145)

## 2019-04-09 NOTE — Progress Notes (Signed)
Primary Care Physician: Isaac Bliss, [name], MD Primary Cardiologist: Dr Loney Hering Primary Electrophysiologist: Dr Curt Bears Referring Physician: Dr Adam Phenix. is a 79 y.o. male with a history of persistent atrial fibrillation, mild systolic heart failure, diabetes, HTN, hyperlipidemia who presents for follow up in the [facility]. She had ablation for atrial fibrillation in 2015 with a repeat cardioversion in 2017 and a repeat cardioversion September 2019. Patient had recurrence of his afib with symptoms of SOB and orthopnea and had attempted DCCV on 10/10/18 which was unsuccessful. Patient is on Eliquis for a CHADS2VASC score of 5. Patient is now s/p repeat ablation with Dr Curt Bears on 03/12/19. Patient reports that he has done well since his procedure with increased exercise tolerance. He denies CP, swallowing, or groin issues. He does note increased lower extremity edema and weight gain since the ablation. No orthopnea, SOB or PND.  Today, he denies symptoms of palpitations, chest pain, shortness of breath, orthopnea, PND, dizziness, presyncope, syncope, snoring, daytime somnolence, bleeding, or neurologic sequela. The patient is tolerating medications without difficulties and is otherwise without complaint today.    Atrial Fibrillation Risk Factors:  he does not have symptoms or diagnosis of sleep apnea. he does not have a history of rheumatic fever.   he has a BMI of Body mass index is 27.89 kg/m.Marland Kitchen Filed Weights   04/09/19 1023  Weight: 88.2 kg    Family History  Problem Relation Age of Onset  . Cancer Mother   . Depression Mother   . Early death Mother   . Cancer Father   . Depression Father   . Early death Father      Atrial Fibrillation Management history:  Previous antiarrhythmic drugs: none Previous cardioversions: 2017, 10/2017, 10/10/18 Previous ablations: 2015 [facility], 03/12/19 CHADS2VASC score: 5 Anticoagulation  history: Eliquis   Past Medical History:  Diagnosis Date  . B12 deficiency   . 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 Cancer ([place])   . Chronic anticoagulation   . Chronic diastolic (congestive) heart failure (HCC) 04/16/2013   Functional class II, ejection fraction 35-40%  Last Assessment & Plan:  Clinically stable Volume well controlled meds reviewed  . Colon polyps 04/16/2013  . Diabetic neuropathy ([place])   . Essential hypertension 01/06/2010   Last Assessment & Plan:  Well controlled Continue med management  . 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  Steroid usage was discontinued several months ago.  Follow up with Dermatology as planned.  . Hyperlipidemia associated with type 2 diabetes mellitus ([place]) 01/28/2018  . Infection of prosthetic right knee joint ([place])   . 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 On amiodarone therapy 09/07/2013  . On continuous oral anticoagulation 01/28/2018  . Overweight (BMI 25.0-29.9) 10/22/2016  . Persistent atrial fibrillation ([place]) 04/16/2013   Last Assessment & Plan:  Rate controlled Continue med management eliquis for stroke preventino  Formatting of this note might be different from the original.  Drug  HX Current Rx Pre-ABL inefficacy Pre-ABL intolerant Post-ABL inefficacy Post-ABL intolerant max dose/24h M/Y end comments  sotalol                  dofetilide  flecainide                  propafenone                  am  . S/P TKR (total knee replacement), bilateral   . Tinea cruris 04/16/2013  . Type 2 diabetes mellitus with diabetic polyneuropathy, without long-term current use of insulin ([place]) 04/16/2013   Last Assessment & Plan:  Labs today Pt reports well controlled on ambulatory monitoring   Past Surgical History:  Procedure Laterality  Date  . ATRIAL FIBRILLATION ABLATION N/A 03/12/2019   Procedure: ATRIAL FIBRILLATION ABLATION;  Surgeon: [name], MD;  Location: [facility];  Service: Cardiovascular;  Laterality: N/A;  . CARDIAC ELECTROPHYSIOLOGY STUDY AND ABLATION    . CARDIOVERSION    . CARDIOVERSION N/A 10/10/2018   Procedure: CARDIOVERSION;  Surgeon: [name], MD;  Location: [facility];  Service: Cardiovascular;  Laterality: N/A;  . CHOLECYSTECTOMY    . PROSTATECTOMY    . REPLACEMENT TOTAL KNEE BILATERAL      Current Outpatient Medications  Medication Sig Dispense Refill  . atorvastatin (LIPITOR) 20 MG tablet TAKE 1 TABLET DAILY AT 6 P.M. (Patient taking differently: Take 20 mg by mouth daily at 6 PM. ) 90 tablet 1  . diltiazem (CARDIZEM CD) 360 MG 24 hr capsule TAKE 1 CAPSULE DAILY (Patient taking differently: Take 360 mg by mouth daily. ) 90 capsule 1  . ELIQUIS 5 MG TABS tablet TAKE 1 TABLET TWICE A DAY (Patient taking differently: Take 5 mg by mouth 2 (two) times daily. ) 180 tablet 3  . fluticasone (FLONASE) 50 MCG/ACT nasal spray Place 2 sprays into both nostrils daily. (Patient taking differently: Place 2 sprays into both nostrils daily as needed for allergies. ) 16 g 2  . furosemide (LASIX) 40 MG tablet Take 1 tablet (40 mg total) by mouth 2 (two) times daily. 180 tablet 1  . gabapentin (NEURONTIN) 300 MG capsule TAKE 2 CAPSULES AT BEDTIME 180 capsule 1  . glucose blood ([employer] TEST STRIPS) test strip Test 2x dailyDx: type 2 diabetes, uncontrolledfluctuating blood sugars 100 each 0  . glucose blood test strip Use to test blood sugars once daily as directed Dx:E11.65    . KLOR-CON M20 20 MEQ tablet TAKE 1 TABLET BY MOUTH EVERY DAY 30 tablet 3  . metFORMIN (GLUCOPHAGE) 500 MG tablet TAKE 1 TABLET TWICE A DAY WITH MEALS (Patient taking differently: Take 500 mg by mouth 2 (two) times daily. ) 180 tablet 1  . metoprolol tartrate (LOPRESSOR) 50 MG tablet Take 2 tablets (100 mg total) by  mouth 2 (two) times daily. 180 tablet 1  . montelukast (SINGULAIR) 10 MG tablet Take 10 mg by mouth at bedtime.     . levalbuterol (XOPENEX) 0.63 MG/3ML nebulizer solution Take 3 mLs (0.63 mg total) by nebulization every 8 (eight) hours as needed for up to 10 days for wheezing or shortness of breath. (Patient not taking: Reported on 03/06/2019) 3 mL 2   No current facility-administered medications for this encounter.    No Known Allergies  Social History   Socioeconomic History  . Marital status: Widowed    Spouse name: Not on file  . Number of children: Not on file  . Years of education: Not on file  . Highest education level: Not on file  Occupational History  . Not on file  Tobacco Use  . Smoking status: Never Smoker  . Smokeless tobacco: Never  Used  Substance and Sexual Activity  . Alcohol use: Yes    Alcohol/week: 2.0 standard drinks    Types: 2 Glasses of wine per week    Comment: daily  . Drug use: Never  . Sexual activity: Not Currently  Other Topics Concern  . Not on file  Social History Narrative  . Not on file   Social Determinants of Health   Financial Resource Strain:   . Difficulty of Paying Living Expenses: Not on file  Food Insecurity:   . Worried About Charity fundraiser in the Last Year: Not on file  . Ran Out of Food in the Last Year: Not on file  Transportation Needs:   . Lack of Transportation (Medical): Not on file  . Lack of Transportation (Non-Medical): Not on file  Physical Activity:   . Days of Exercise per Week: Not on file  . Minutes of Exercise per Session: Not on file  Stress:   . Feeling of Stress : Not on file  Social Connections:   . Frequency of Communication with Friends and Family: Not on file  . Frequency of Social Gatherings with Friends and Family: Not on file  . Attends Religious Services: Not on file  . Active Member of Clubs or Organizations: Not on file  . Attends Archivist Meetings: Not on file  . Marital  Status: Not on file  Intimate Partner Violence:   . Fear of Current or Ex-Partner: Not on file  . Emotionally Abused: Not on file  . Physically Abused: Not on file  . Sexually Abused: Not on file     ROS- All systems are reviewed and negative except as per the HPI above.  Physical Exam: Vitals:   04/09/19 1023  BP: 134/74  Pulse: 64  Weight: 88.2 kg  Height: 5\' 10"  (1.778 m)    GEN- The patient is well appearing elderly male, alert and oriented x 3 today.   Head- normocephalic, atraumatic Eyes-  Sclera clear, conjunctiva pink Ears- hearing intact Oropharynx- clear Neck- supple  Lungs- Clear to ausculation bilaterally, normal work of breathing Heart- Regular rate and rhythm, no murmurs, rubs or gallops  GI- soft, NT, ND, + BS Extremities- no clubbing, cyanosis. Trace bilateral edema MS- no significant deformity or atrophy Skin- no rash or lesion Psych- euthymic mood, full affect Neuro- strength and sensation are intact  Wt Readings from Last 3 Encounters:  04/09/19 88.2 kg  03/12/19 81.6 kg  02/17/19 81.6 kg    EKG today demonstrates SR HR 64, 1st degree AV block, PR 248, QRS 98, QTc 422  Echo 10/07/18 demonstrated  1. The left ventricle has low normal systolic function, with an ejection  fraction of 50-55%. The cavity size was normal. Left ventricular diastolic  function could not be evaluated secondary to atrial fibrillation. No  evidence of left ventricular  regional wall motion abnormalities.  2. The right ventricle has normal systolic function. The cavity was  normal. There is no increase in right ventricular wall thickness. Right  ventricular systolic pressure is normal.  3. There is mild mitral annular calcification present.  4. The aortic valve is tricuspid. Moderate sclerosis of the aortic valve.  Aortic valve regurgitation was not assessed by color flow Doppler.  5. The aorta is normal in size and structure.  6. There is right bowing of the  interatrial septum, suggestive of  elevated left atrial pressure.   Epic records are reviewed at length today  CHA2DS2-VASc Score =  5 The patient's score is based upon: CHF History: Yes HTN History: Yes Age : 82 + Diabetes History: Yes Stroke History: No Vascular Disease History: No Gender: Male      ASSESSMENT AND PLAN: 1. Persistent Atrial Fibrillation (ICD10:  I48.19) The patient's CHA2DS2-VASc score is 5, indicating a 7.2% annual risk of stroke.   S/p repeat afib ablation with Dr Curt Bears 03/12/19. Patient appears to be maintaining SR. Continue Eliquis 5 mg BID with no missed doses for at least 3 months post ablation. Continue Lopressor 100 mg BID  2. Secondary Hypercoagulable State (ICD10:  D68.69) The patient is at significant risk for stroke/thromboembolism based upon his CHA2DS2-VASc Score of 5.  Continue Apixaban (Eliquis).    3. Chronic systolic CHF Patient's weight is up with symptoms of lower extremity edema post procedure.  Increase lasix to 80 mg AM and 40 PM x3 days. Extra KCl x3 days. Check bmet today.  4. HTN Stable, no changes today.   Follow up with Dr Curt Bears as scheduled.    [facility] [address] [place], [place] [address] [phone] 04/09/2019 10:50 AM

## 2019-04-09 NOTE — Patient Instructions (Signed)
Take an extra tablet of lasix and potassium in the morning for the next 3 days then reduce back to normal dosing.

## 2019-04-10 ENCOUNTER — Telehealth: Payer: Self-pay | Admitting: *Deleted

## 2019-04-10 ENCOUNTER — Ambulatory Visit: Payer: Medicare Other | Admitting: Internal Medicine

## 2019-04-10 ENCOUNTER — Encounter: Payer: Self-pay | Admitting: Internal Medicine

## 2019-04-10 VITALS — BP 128/70 | HR 62 | Temp 97.3°F | Wt 194.5 lb

## 2019-04-10 DIAGNOSIS — I5033 Acute on chronic diastolic (congestive) heart failure: Secondary | ICD-10-CM | POA: Diagnosis not present

## 2019-04-10 DIAGNOSIS — E1142 Type 2 diabetes mellitus with diabetic polyneuropathy: Secondary | ICD-10-CM

## 2019-04-10 DIAGNOSIS — I4819 Other persistent atrial fibrillation: Secondary | ICD-10-CM | POA: Diagnosis not present

## 2019-04-10 DIAGNOSIS — R931 Abnormal findings on diagnostic imaging of heart and coronary circulation: Secondary | ICD-10-CM

## 2019-04-10 LAB — POCT GLYCOSYLATED HEMOGLOBIN (HGB A1C): Hemoglobin A1C: 6.8 % — AB (ref 4.0–5.6)

## 2019-04-10 NOTE — Telephone Encounter (Signed)
Patient is returning phone call.  °

## 2019-04-10 NOTE — Telephone Encounter (Signed)
lmtcb to arrange lab    Charles Haw, MD  Isaac Bliss, [name], MD; Stanton Kidney, RN  CT without LAA thrombus. Elevated calcium, needs fasting lipids.

## 2019-04-10 NOTE — Progress Notes (Signed)
Established Patient Office Visit     This visit occurred during the SARS-CoV-2 public health emergency.  Safety protocols were in place, including screening questions prior to the visit, additional usage of staff PPE, and extensive cleaning of exam room while observing appropriate contact time as indicated for disinfecting solutions.    CC/Reason for Visit: Follow-up diabetes, lower extremity edema  HPI: Charles Gatt. is a 79 y.o. male who is coming in today for the above mentioned reasons.  He recently had an atrial fibrillation ablation procedure which was successful.  However ever since then he has noticed a 10 pound weight gain with lower extremity edema up to his knees and abdominal bloating.  He scheduled this appointment with me, but was able to get in with cardiology yesterday where he was instructed to double up his Lasix dose for 3 days.  He states that the increase in lower extremity edema has made his neuropathy worse.  Past Medical/Surgical History: Past Medical History:  Diagnosis Date  . B12 deficiency   . 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 Cancer ([place])   . Chronic anticoagulation   . Chronic diastolic (congestive) heart failure (HCC) 04/16/2013   Functional class II, ejection fraction 35-40%  Last Assessment & Plan:  Clinically stable Volume well controlled meds reviewed  . Colon polyps 04/16/2013  . Diabetic neuropathy ([place])   . Essential hypertension 01/06/2010   Last Assessment & Plan:  Well controlled Continue med management  . 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  Steroid usage was discontinued several months ago.  Follow up with Dermatology as planned.  . Hyperlipidemia associated with type 2 diabetes mellitus ([place]) 01/28/2018  . Infection of prosthetic right knee joint ([place])   . Insomnia 03/04/2012   Grief with loss of wife 02/20/12  Last Assessment & Plan:  Improved sx   contineu xanax prn Se discussed  . 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 On amiodarone therapy 09/07/2013  . On continuous oral anticoagulation 01/28/2018  . Overweight (BMI 25.0-29.9) 10/22/2016  . Persistent atrial fibrillation ([place]) 04/16/2013   Last Assessment & Plan:  Rate controlled Continue med management eliquis for stroke preventino  Formatting of this note might be different from the original.  Drug  HX Current Rx Pre-ABL inefficacy Pre-ABL intolerant Post-ABL inefficacy Post-ABL intolerant max dose/24h M/Y end comments  sotalol                  dofetilide                  flecainide                  propafenone                  am  . S/P TKR (total knee replacement), bilateral   . Tinea cruris 04/16/2013  . Type 2 diabetes mellitus with diabetic polyneuropathy, without long-term current use of insulin ([place]) 04/16/2013   Last Assessment & Plan:  Labs today Pt reports well controlled on ambulatory monitoring    Past Surgical History:  Procedure Laterality Date  . ATRIAL FIBRILLATION ABLATION N/A 03/12/2019   Procedure: ATRIAL FIBRILLATION ABLATION;  Surgeon: [name], MD;  Location: [facility];  Service: Cardiovascular;  Laterality: N/A;  . CARDIAC ELECTROPHYSIOLOGY STUDY AND ABLATION    . CARDIOVERSION    . CARDIOVERSION N/A  10/10/2018   Procedure: CARDIOVERSION;  Surgeon: [name], MD;  Location: [facility] ENDOSCOPY;  Service: Cardiovascular;  Laterality: N/A;  . CHOLECYSTECTOMY    . PROSTATECTOMY    . REPLACEMENT TOTAL KNEE BILATERAL      Social History:  reports that he has never smoked. He has never used smokeless tobacco. He reports current alcohol use of about 2.0 standard drinks of alcohol per week. He reports that he does not use drugs.  Allergies: No Known Allergies  Family History:  Family History  Problem Relation Age of Onset  . Cancer Mother   . Depression Mother   . Early death Mother   . Cancer Father   . Depression Father   . Early death  Father      Current Outpatient Medications:  .  atorvastatin (LIPITOR) 20 MG tablet, TAKE 1 TABLET DAILY AT 6 P.M. (Patient taking differently: Take 20 mg by mouth daily at 6 PM. ), Disp: 90 tablet, Rfl: 1 .  diltiazem (CARDIZEM CD) 360 MG 24 hr capsule, TAKE 1 CAPSULE DAILY (Patient taking differently: Take 360 mg by mouth daily. ), Disp: 90 capsule, Rfl: 1 .  ELIQUIS 5 MG TABS tablet, TAKE 1 TABLET TWICE A DAY (Patient taking differently: Take 5 mg by mouth 2 (two) times daily. ), Disp: 180 tablet, Rfl: 3 .  fluticasone (FLONASE) 50 MCG/ACT nasal spray, Place 2 sprays into both nostrils daily. (Patient taking differently: Place 2 sprays into both nostrils daily as needed for allergies. ), Disp: 16 g, Rfl: 2 .  furosemide (LASIX) 40 MG tablet, Take 1 tablet (40 mg total) by mouth 2 (two) times daily., Disp: 180 tablet, Rfl: 1 .  gabapentin (NEURONTIN) 300 MG capsule, TAKE 2 CAPSULES AT BEDTIME, Disp: 180 capsule, Rfl: 1 .  glucose blood ([employer] TEST STRIPS) test strip, Test 2x dailyDx: type 2 diabetes, uncontrolledfluctuating blood sugars, Disp: 100 each, Rfl: 0 .  glucose blood test strip, Use to test blood sugars once daily as directed Dx:E11.65, Disp: , Rfl:  .  KLOR-CON M20 20 MEQ tablet, TAKE 1 TABLET BY MOUTH EVERY DAY, Disp: 30 tablet, Rfl: 3 .  metFORMIN (GLUCOPHAGE) 500 MG tablet, TAKE 1 TABLET TWICE A DAY WITH MEALS (Patient taking differently: Take 500 mg by mouth 2 (two) times daily. ), Disp: 180 tablet, Rfl: 1 .  metoprolol tartrate (LOPRESSOR) 50 MG tablet, Take 2 tablets (100 mg total) by mouth 2 (two) times daily., Disp: 180 tablet, Rfl: 1 .  montelukast (SINGULAIR) 10 MG tablet, Take 10 mg by mouth at bedtime. , Disp: , Rfl:  .  levalbuterol (XOPENEX) 0.63 MG/3ML nebulizer solution, Take 3 mLs (0.63 mg total) by nebulization every 8 (eight) hours as needed for up to 10 days for wheezing or shortness of breath. (Patient not taking: Reported on 03/06/2019), Disp: 3 mL, Rfl: 2  Review  of Systems:  Constitutional: Denies fever, chills, diaphoresis, appetite change and fatigue.  HEENT: Denies photophobia, eye pain, redness, hearing loss, ear pain, congestion, sore throat, rhinorrhea, sneezing, mouth sores, trouble swallowing, neck pain, neck stiffness and tinnitus.   Respiratory: Denies SOB, DOE, cough, chest tightness,  and wheezing.   Cardiovascular: Denies chest pain, palpitations. Gastrointestinal: Denies nausea, vomiting, abdominal pain, diarrhea, constipation, blood in stool and abdominal distention.  Genitourinary: Denies dysuria, urgency, frequency, hematuria, flank pain and difficulty urinating.  Endocrine: Denies: hot or cold intolerance, sweats, changes in hair or nails, polyuria, polydipsia. Musculoskeletal: Denies myalgias, back pain, joint swelling, arthralgias and gait problem.  Skin: Denies pallor, rash and wound.  Neurological: Denies dizziness, seizures, syncope, weakness, light-headedness and headaches.  Hematological: Denies adenopathy. Easy bruising, personal or family bleeding history  Psychiatric/Behavioral: Denies suicidal ideation, mood changes, confusion, nervousness, sleep disturbance and agitation    Physical Exam: Vitals:   04/10/19 1126  BP: 128/70  Pulse: 62  Temp: (!) 97.3 F (36.3 C)  TempSrc: Temporal  SpO2: 98%  Weight: 194 lb 8 oz (88.2 kg)    Body mass index is 27.91 kg/m.   Constitutional: NAD, calm, comfortable Eyes: PERRL, lids and conjunctivae normal ENMT: Mucous membranes are moist.  Respiratory: clear to auscultation bilaterally, no wheezing, no crackles. Normal respiratory effort. No accessory muscle use.  Cardiovascular: Regular rate and rhythm, no murmurs / rubs / gallops.  2+ pitting edema bilaterally almost up to his knees. Abdomen: no tenderness, distended no masses palpated. No hepatosplenomegaly. Bowel sounds positive.  Neurologic: Grossly intact and nonfocal Psychiatric: Normal judgment and insight. Alert and  oriented x 3. Normal mood.    Impression and Plan:  Type 2 diabetes mellitus with diabetic polyneuropathy, without long-term current use of insulin (HCC)  -A1c today is 6.8, demonstrating good control.  Persistent atrial fibrillation ([place]) -Status post ablation.  Acute on chronic diastolic CHF (congestive heart failure) ([place]) -Seen by cardiology yesterday with plans to double up Lasix dose for the next 2 days. -He is advised to seek care with either me or cardiology in the next 2 weeks if no significant improvement.    Lelon Frohlich, MD [facility]

## 2019-04-12 ENCOUNTER — Other Ambulatory Visit: Payer: Self-pay | Admitting: Internal Medicine

## 2019-04-13 ENCOUNTER — Encounter: Payer: Self-pay | Admitting: Internal Medicine

## 2019-04-16 NOTE — Telephone Encounter (Signed)
Spoke to pt about arranging Lipid profile per Dr. Curt Bears. He will stop by [place] office for fasting blood work.  Will route to Dr. Bettina Gavia to review 1/11 CT result - Calcium score 3424 w/ recommended Myoview follow up to further evaluate. Pt aware of this result and that Dr. Joya Gaskins office would be in touch w/ his testing recommendation. Pt aware that nuclear study will most likely be scheduled. Pt agreeable to plan.

## 2019-04-17 NOTE — Telephone Encounter (Signed)
So do I schedule him here in [place]?  I see he lives in [place]

## 2019-04-19 ENCOUNTER — Ambulatory Visit: Payer: Medicare Other | Attending: Internal Medicine

## 2019-04-19 DIAGNOSIS — Z23 Encounter for immunization: Secondary | ICD-10-CM | POA: Insufficient documentation

## 2019-04-19 NOTE — Progress Notes (Signed)
   [id number]  Name:  Gurdeep Macauley.    MRN: [id number] DOB: 1940/11/27  04/19/2019  Mr. Morrisette was observed post Covid-19 immunization for 15 minutes without incidence. He was provided with Vaccine Information Sheet and instruction to access the V-Safe system.   Mr. Buruca was instructed to call 911 with any severe reactions post vaccine: Marland Kitchen Difficulty breathing  . Swelling of your face and throat  . A fast heartbeat  . A bad rash all over your body  . Dizziness and weakness    Immunizations Administered    Name Date Dose VIS Date Route   Pfizer COVID-19 Vaccine 04/19/2019  1:40 PM 0.3 mL 02/06/2019 Intramuscular   Manufacturer: [place]   Lot: [id number]   [place]: [id number]

## 2019-04-20 ENCOUNTER — Encounter: Payer: Self-pay | Admitting: Pulmonary Disease

## 2019-04-20 ENCOUNTER — Other Ambulatory Visit: Payer: Self-pay

## 2019-04-20 ENCOUNTER — Ambulatory Visit: Payer: Medicare Other | Admitting: Pulmonary Disease

## 2019-04-20 DIAGNOSIS — J4520 Mild intermittent asthma, uncomplicated: Secondary | ICD-10-CM | POA: Diagnosis not present

## 2019-04-20 DIAGNOSIS — K769 Liver disease, unspecified: Secondary | ICD-10-CM

## 2019-04-20 HISTORY — DX: Liver disease, unspecified: K76.9

## 2019-04-20 MED ORDER — ALBUTEROL SULFATE HFA 108 (90 BASE) MCG/ACT IN AERS: 2.0000 | INHALATION_SPRAY | Freq: Four times a day (QID) | RESPIRATORY_TRACT | 2 refills | Status: DC | PRN

## 2019-04-20 NOTE — Patient Instructions (Signed)
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

## 2019-04-20 NOTE — Progress Notes (Signed)
   Subjective:    Patient ID: Charles Gum., male    DOB: Jun 20, 1940, 79 y.o.   MRN: [id number]  HPI  79 year old never smoker for FU of recurrent dyspnea, cough and wheezing He was admitted twice in October and November and both times improved with steroids. 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  He also had his ablation at the same time.  Cardiac CT was obtained which we reviewed today. He developed increased pedal edema and required Lasix. He saw ENT again who said that he did not have any nasal polyps, is another follow-up scheduled in 2 weeks  Significant tests/ events reviewed  CT cardiac 02/2019 -clear lungs, low-attenuation lesions in the liver  Echo 09/2018 showed normal EF Absolute eosinophil count 1700 on 11/9, 1800 on 10/21, 400 on 07/24/18  Review of Systems neg for any significant sore throat, dysphagia, itching, sneezing, nasal congestion or excess/ purulent secretions, fever, chills, sweats, unintended wt loss, pleuritic or exertional cp, hempoptysis, orthopnea pnd or change in chronic leg swelling. Also denies presyncope, palpitations, heartburn, abdominal pain, nausea, vomiting, diarrhea or change in bowel or urinary habits, dysuria,hematuria, rash, arthralgias, visual complaints, headache, numbness weakness or ataxia.     Objective:   Physical Exam   Gen. Pleasant, well-nourished, in no distress ENT - no thrush, no pallor/icterus,no post nasal drip Neck: No JVD, no thyromegaly, no carotid bruits Lungs: no use of accessory muscles, no  dullness to percussion, clear without rales or rhonchi  Cardiovascular: Rhythm regular, heart sounds  normal, no murmurs or gallops, 1+ peripheral edema Musculoskeletal: No deformities, no cyanosis or clubbing         Assessment & Plan:

## 2019-04-20 NOTE — Telephone Encounter (Signed)
I would schedule him to see [facility] in [place] office if possible.  Pt has memory issues, may need to schedule w/ son.

## 2019-04-20 NOTE — Assessment & Plan Note (Signed)
breathing problems could have been related to cardiac issues/fluid retention Follow-up with ENT  Prescription for albuterol MDI 2 puffs every 6 hours as needed for wheezing If recurs, call us for prescription for [facility]

## 2019-04-20 NOTE — Assessment & Plan Note (Signed)
Defer to cardiology for further work-up. May need follow-up imaging in 6 months versus GI referral

## 2019-04-21 ENCOUNTER — Ambulatory Visit: Payer: Medicare Other | Admitting: Cardiology

## 2019-04-21 ENCOUNTER — Other Ambulatory Visit: Payer: Medicare Other

## 2019-04-22 ENCOUNTER — Telehealth: Payer: Self-pay | Admitting: Pulmonary Disease

## 2019-04-22 ENCOUNTER — Other Ambulatory Visit: Payer: Self-pay | Admitting: Pulmonary Disease

## 2019-04-22 DIAGNOSIS — J4541 Moderate persistent asthma with (acute) exacerbation: Secondary | ICD-10-CM

## 2019-04-22 DIAGNOSIS — I5032 Chronic diastolic (congestive) heart failure: Secondary | ICD-10-CM

## 2019-04-22 LAB — LIPID PANEL
Chol/HDL Ratio: 3.3 ratio (ref 0.0–5.0)
Cholesterol, Total: 135 mg/dL (ref 100–199)
HDL: 41 mg/dL (ref >39)
LDL Chol Calc (NIH): 78 mg/dL (ref 0–99)
Triglycerides: 81 mg/dL (ref 0–149)
VLDL Cholesterol Cal: 16 mg/dL (ref 5–40)

## 2019-04-22 NOTE — Telephone Encounter (Signed)
Called and spoke with patient.  Patient stated he needed at refill of Brovana and Budesonide to be sent [facility].  Patent stated he feels like he is having a relapse, drowning in his sleep.   Brovana and Budesonide are not listed on current med list.   Dr. Elsworth Soho please advise  LOV 04/20/19 with Dr. Elsworth Soho-   Instructions    Return in about 6 months (around 10/18/2019) for BM. 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

## 2019-04-23 MED ORDER — BUDESONIDE 0.5 MG/2ML IN SUSP: 0.5000 mg | Freq: Two times a day (BID) | RESPIRATORY_TRACT | 1 refills | Status: DC

## 2019-04-23 MED ORDER — ARFORMOTEROL TARTRATE 15 MCG/2ML IN NEBU: 15.0000 ug | INHALATION_SOLUTION | Freq: Two times a day (BID) | RESPIRATORY_TRACT | 1 refills | Status: DC

## 2019-04-23 NOTE — Telephone Encounter (Signed)
04/23/2019  Start here the patient is having the symptoms.  It looks like at last appointment with Dr. Elsworth Soho he had suggested that if symptoms worsen that we should send a prescription for Breo Ellipta 100.  Is the patient open to using an inhaler?  Or is he simply stating that he does not want to use an inhaler and he would prefer to be on the nebulized meds?  If patient is willing to use an inhaler then please go ahead and send a prescription for:  Breo Ellipta 100 >>> Take 1 puff daily in the morning right when you wake up >>>Rinse your mouth out after use >>>This is a daily maintenance inhaler, NOT a rescue inhaler >>>Contact our office if you are having difficulties affording or obtaining this medication >>>It is important for you to be able to take this daily and not miss any doses   If patient has never been instructed on how to use an Ellipta device can coordinate a pharmacy team appointment with our office to teach him.  If the patient is not interested in inhaler okay with doing a refill for Brovana and Pulmicort nebs for the time being.   Since patient is either starting back on an inhaler or starting back on his nebulized meds.  Please also schedule the patient for a pulmonary function test.  Okay to place order.  This is a breathing test to further evaluate his reactive airways disease.  Please route this message to Dr. Elsworth Soho to see if he has any other comments or suggestions.  Wyn Quaker, FNP

## 2019-04-23 NOTE — Telephone Encounter (Signed)
I spoke with the pt and notified of response per Charles Lloyd  He states has tried Breo in the past, but feels that the nebs work best and so he prefers these  I have refilled this for him for now  Have him scheduled for PFT first available 07/03/19  Will forward to Dr Elsworth Soho to make him aware

## 2019-04-23 NOTE — Telephone Encounter (Signed)
Pt calling back regarding medications.  Please advise.  [phone]

## 2019-04-23 NOTE — Telephone Encounter (Signed)
Spoke with the pt  He is calling again about getting his brovana and budesonide nebs refilled  He states that he has been on these for a long time, but refills ran out back in the late summer and he was doing well and never got them refilled. He now is having some increased SOB over the past few days, mainly occurs when he lies down, and wants to know if we can refill these to see if this will help  He just had visit with Dr Elsworth Soho on 04/20/19:   Instructions    Return in about 6 months (around 10/18/2019) for BM. Your breathing problems could have been related to cardiac issues/fluid retention Follow-up with ENT  Prescription for albuterol MDI 2 puffs every 6 hours as needed for wheezing If recurs, call us for prescription for Breo  You have low-density liver lesions in the liver -please ask your cardiologist to follow-up on this       He has already scheduled an appt with ENT and also with Dr. Bettina Gavia Please advise thanks!

## 2019-05-05 NOTE — Progress Notes (Signed)
Cardiology Office Note:    Date:  05/06/2019   ID:  Charles Gum., DOB 31-Mar-1940, MRN [id number]  PCP:  Isaac Bliss, [name], MD  Cardiologist:  [name], MD    Referring MD: Isaac Bliss, [place]*    ASSESSMENT:    1. Paroxysmal atrial fibrillation (HCC)   2. Chronic anticoagulation   3. Secondary hypercoagulable state ([place])   4. Hypertensive heart disease with heart failure ([place])   5. Chronic combined systolic and diastolic heart failure ([place])   6. Coronary artery calcification    PLAN:    In order of problems listed above:  1. Stable in sinus rhythm after his recent EP PVI continue his anticoagulant 2. Continue current anticoagulant 3. Continue current treatment if edema persists will need to discontinue his calcium channel blocker transition from furosemide to torsemide 4. Coronary artery calcification we will a myocardial perfusion study for risk assessment coronary artery calcification   Next appointment: 6 weeks   Medication Adjustments/Labs and Tests Ordered: Current medicines are reviewed at length with the patient today.  Concerns regarding medicines are outlined above.  No orders of the defined types were placed in this encounter.  No orders of the defined types were placed in this encounter.   Chief Complaint  Patient presents with  . Follow-up  . Congestive Heart Failure  . Atrial Fibrillation    History of Present Illness:    Charles Gemberling. is a 79 y.o. male with a hx of  persistent  atrial fibrillation with initial ablation in 2015 at [facility], subsequently had cardioversion in 2017 and again repeat electrical cardioversion in September 2019, heart failure EF 40%, hypertension and hyperlipidemia. He is maintained on chronic anticoagulation with Eliquis referred to EP for consideration of pulmonary vein isolation.  He had pulmonary vein isolation again Dr. Curt Bears 03/12/2019 successfully resuming sinus rhythm on.  Ejection fraction 50  to 55% 10/07/2018. He was last seen 11/06/2018. Compliance with diet, lifestyle and medications: Yes  Was not quite sure why he was here I thought I had to deal with his edema and heart failure however he tells me that he was told to come and see me because of a CT of his chest I looked at the CTA that was done prior to ablation and he had severe coronary artery calcification.  He has had a previous myocardial perfusion study was normal and he is having no angina and we will set him up for an outpatient myocardial perfusion study and he has high risk markers CAD coronary angiography.  His diuretic dose was increased his weight is down about 5 pounds but still has peripheral edema appears to have diuretic resistance and he will transition to torsemide.  I will see back in 6 weeks after his perfusion study recheck renal function proBNP at that time.  He is having no palpitation chest pain shortness of breath or syncope.  Tolerates his anticoagulant without bleeding complication.  He also asked me about liver abnormality in the CT scan talks about very small nodules which are too small to characterize and I told him at this time I do not think it is a problem. Past Medical History:  Diagnosis Date  . B12 deficiency   . 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 Cancer ([place])   . Chronic anticoagulation   . Chronic diastolic (congestive) heart failure (HCC) 04/16/2013   Functional class II, ejection fraction 35-40%  Last Assessment & Plan:  Clinically stable Volume well controlled meds reviewed  . Colon polyps 04/16/2013  . Diabetic neuropathy ([place])   . Essential hypertension 01/06/2010   Last Assessment & Plan:  Well controlled Continue med management  . 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  Steroid usage was discontinued several months ago.  Follow up with Dermatology as planned.  . Hyperlipidemia associated with type 2  diabetes mellitus ([place]) 01/28/2018  . Infection of prosthetic right knee joint ([place])   . 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 On amiodarone therapy 09/07/2013  . On continuous oral anticoagulation 01/28/2018  . Overweight (BMI 25.0-29.9) 10/22/2016  . Persistent atrial fibrillation ([place]) 04/16/2013   Last Assessment & Plan:  Rate controlled Continue med management eliquis for stroke preventino  Formatting of this note might be different from the original.  Drug  HX Current Rx Pre-ABL inefficacy Pre-ABL intolerant Post-ABL inefficacy Post-ABL intolerant max dose/24h M/Y end comments  sotalol                  dofetilide                  flecainide                  propafenone                  am  . S/P TKR (total knee replacement), bilateral   . Tinea cruris 04/16/2013  . Type 2 diabetes mellitus with diabetic polyneuropathy, without long-term current use of insulin ([place]) 04/16/2013   Last Assessment & Plan:  Labs today Pt reports well controlled on ambulatory monitoring    Past Surgical History:  Procedure Laterality Date  . ATRIAL FIBRILLATION ABLATION N/A 03/12/2019   Procedure: ATRIAL FIBRILLATION ABLATION;  Surgeon: [name], MD;  Location: [place] [facility];  Service: Cardiovascular;  Laterality: N/A;  . CARDIAC ELECTROPHYSIOLOGY STUDY AND ABLATION    . CARDIOVERSION    . CARDIOVERSION N/A 10/10/2018   Procedure: CARDIOVERSION;  Surgeon: [name], MD;  Location: [facility] ENDOSCOPY;  Service: Cardiovascular;  Laterality: N/A;  . CHOLECYSTECTOMY    . PROSTATECTOMY    . REPLACEMENT TOTAL KNEE BILATERAL      Current Medications: Current Meds  Medication Sig  . albuterol (VENTOLIN HFA) 108 (90 Base) MCG/ACT inhaler Inhale 2 puffs into the lungs every 6 (six) hours as needed for wheezing or shortness of breath.  Marland Kitchen arformoterol (BROVANA) 15 MCG/2ML NEBU Take 2  mLs (15 mcg total) by nebulization 2 (two) times daily.  . budesonide (PULMICORT) 0.5 MG/2ML nebulizer solution Take 2 mLs (0.5 mg total) by nebulization in the morning and at bedtime.  Marland Kitchen diltiazem (CARDIZEM CD) 360 MG 24 hr capsule TAKE 1 CAPSULE DAILY  . ELIQUIS 5 MG TABS tablet TAKE 1 TABLET TWICE A DAY (Patient taking differently: Take 5 mg by mouth 2 (two) times daily. )  . fluticasone (FLONASE) 50 MCG/ACT nasal spray Place 2 sprays into both nostrils daily. (Patient taking differently: Place 2 sprays into both nostrils daily as needed for allergies. )  . furosemide (LASIX) 40 MG tablet Take 1 tablet (40 mg total) by mouth 2 (two) times daily.  Marland Kitchen gabapentin (NEURONTIN) 300 MG capsule TAKE 2 CAPSULES AT BEDTIME  . glucose blood ([employer] TEST  STRIPS) test strip Test 2x dailyDx: type 2 diabetes, uncontrolledfluctuating blood sugars  . glucose blood test strip Use to test blood sugars once daily as directed Dx:E11.65  . KLOR-CON M20 20 MEQ tablet TAKE 1 TABLET BY MOUTH EVERY DAY  . metFORMIN (GLUCOPHAGE) 500 MG tablet TAKE 1 TABLET TWICE A DAY WITH MEALS (Patient taking differently: Take 500 mg by mouth 2 (two) times daily. )  . metoprolol tartrate (LOPRESSOR) 50 MG tablet Take 2 tablets (100 mg total) by mouth 2 (two) times daily.  . montelukast (SINGULAIR) 10 MG tablet Take 10 mg by mouth at bedtime.      Allergies:   Patient has no known allergies.   Social History   Socioeconomic History  . Marital status: Widowed    Spouse name: Not on file  . Number of children: Not on file  . Years of education: Not on file  . Highest education level: Not on file  Occupational History  . Not on file  Tobacco Use  . Smoking status: Never Smoker  . Smokeless tobacco: Never Used  Substance and Sexual Activity  . Alcohol use: Yes    Alcohol/week: 2.0 standard drinks    Types: 2 Glasses of wine per week    Comment: daily  . Drug use: Never  . Sexual activity: Not Currently  Other Topics Concern   . Not on file  Social History Narrative  . Not on file   Social Determinants of Health   Financial Resource Strain:   . Difficulty of Paying Living Expenses: Not on file  Food Insecurity:   . Worried About Charity fundraiser in the Last Year: Not on file  . Ran Out of Food in the Last Year: Not on file  Transportation Needs:   . Lack of Transportation (Medical): Not on file  . Lack of Transportation (Non-Medical): Not on file  Physical Activity:   . Days of Exercise per Week: Not on file  . Minutes of Exercise per Session: Not on file  Stress:   . Feeling of Stress : Not on file  Social Connections:   . Frequency of Communication with Friends and Family: Not on file  . Frequency of Social Gatherings with Friends and Family: Not on file  . Attends Religious Services: Not on file  . Active Member of Clubs or Organizations: Not on file  . Attends Archivist Meetings: Not on file  . Marital Status: Not on file     Family History: The patient's family history includes Cancer in his father and mother; Depression in his father and mother; Early death in his father and mother. ROS:   Please see the history of present illness.    All other systems reviewed and are negative.  EKGs/Labs/Other Studies Reviewed:    The following studies were reviewed today:    Recent Labs: 07/24/2018: TSH 0.87 11/06/2018: NT-Pro BNP 1,062 01/05/2019: B Natriuretic Peptide 590.7 01/06/2019: ALT 21; Magnesium 1.8 03/09/2019: Hemoglobin 11.7; Platelets 230 04/09/2019: BUN 11; Creatinine, Ser 0.98; Potassium 3.6; Sodium 136  Recent Lipid Panel    Component Value Date/Time   CHOL 135 04/21/2019 0940   TRIG 81 04/21/2019 0940   HDL 41 04/21/2019 0940   CHOLHDL 3.3 04/21/2019 0940   CHOLHDL 3 07/24/2018 1058   VLDL 25.0 07/24/2018 1058   LDLCALC 78 04/21/2019 0940    Physical Exam:    VS:  BP 130/68   Pulse 72   Temp 97.6 F (36.4 C)  Ht 5\' 10"  (1.778 m)   Wt 189 lb (85.7 kg)    SpO2 95%   BMI 27.12 kg/m     Wt Readings from Last 3 Encounters:  05/06/19 189 lb (85.7 kg)  04/20/19 194 lb 9.6 oz (88.3 kg)  04/10/19 194 lb 8 oz (88.2 kg)     GEN:  Well nourished, well developed in no acute distress HEENT: Normal NECK: No JVD; No carotid bruits LYMPHATICS: No lymphadenopathy CARDIAC: RRR, no murmurs, rubs, gallops RESPIRATORY:  Clear to auscultation without rales, wheezing or rhonchi  ABDOMEN: Soft, non-tender, non-distended MUSCULOSKELETAL: 2+ pitting bilateral lower extremity edema; No deformity  SKIN: Warm and dry NEUROLOGIC:  Alert and oriented x 3 PSYCHIATRIC:  Normal affect    Signed, [name], MD  05/06/2019 1:55 PM    [facility]

## 2019-05-06 ENCOUNTER — Other Ambulatory Visit: Payer: Self-pay

## 2019-05-06 ENCOUNTER — Ambulatory Visit: Payer: Medicare Other | Admitting: Cardiology

## 2019-05-06 ENCOUNTER — Encounter: Payer: Self-pay | Admitting: Cardiology

## 2019-05-06 VITALS — BP 130/68 | HR 72 | Temp 97.6°F | Ht 70.0 in | Wt 189.0 lb

## 2019-05-06 DIAGNOSIS — D6869 Other thrombophilia: Secondary | ICD-10-CM | POA: Diagnosis not present

## 2019-05-06 DIAGNOSIS — Z7901 Long term (current) use of anticoagulants: Secondary | ICD-10-CM

## 2019-05-06 DIAGNOSIS — I2584 Coronary atherosclerosis due to calcified coronary lesion: Secondary | ICD-10-CM

## 2019-05-06 DIAGNOSIS — I480 Paroxysmal atrial fibrillation: Secondary | ICD-10-CM | POA: Diagnosis not present

## 2019-05-06 DIAGNOSIS — I2510 Atherosclerotic heart disease of native coronary artery without angina pectoris: Secondary | ICD-10-CM

## 2019-05-06 DIAGNOSIS — R9431 Abnormal electrocardiogram [ECG] [EKG]: Secondary | ICD-10-CM

## 2019-05-06 DIAGNOSIS — I110 Hypertensive heart disease with heart failure: Secondary | ICD-10-CM

## 2019-05-06 DIAGNOSIS — I5042 Chronic combined systolic (congestive) and diastolic (congestive) heart failure: Secondary | ICD-10-CM

## 2019-05-06 MED ORDER — TORSEMIDE 20 MG PO TABS: 20.0000 mg | ORAL_TABLET | Freq: Two times a day (BID) | ORAL | 3 refills | Status: DC

## 2019-05-06 NOTE — Patient Instructions (Addendum)
Medication Instructions:  Your physician has recommended you make the following change in your medication:  1-START Torsemide 20 mg by mouth twice daily. 2-STOP Furosemide  *If you need a refill on your cardiac medications before your next appointment, please call your pharmacy*  Lab Work: If you have labs (blood work) drawn today and your tests are completely normal, you will receive your results only by: Marland Kitchen MyChart Message (if you have MyChart) OR . A paper copy in the mail If you have any lab test that is abnormal or we need to change your treatment, we will call you to review the results.  Testing/Procedures: Your physician has requested that you have a lexiscan myoview. For further information please visit [email or web address]. Please follow instruction sheet, as given.  Follow-Up: At [facility], you and your health needs are our priority.  As part of our continuing mission to provide you with exceptional heart care, we have created designated Provider Care Teams.  These Care Teams include your primary Cardiologist (physician) and Advanced Practice Providers (APPs -  Physician Assistants and Nurse Practitioners) who all work together to provide you with the care you need, when you need it.  We recommend signing up for the patient portal called "MyChart".  Sign up information is provided on this After Visit Summary.  MyChart is used to connect with patients for Virtual Visits (Telemedicine).  Patients are able to view lab/test results, encounter notes, upcoming appointments, etc.  Non-urgent messages can be sent to your provider as well.   To learn more about what you can do with MyChart, go to [email or web address].    Your next appointment:   6 weeks  The format for your next appointment:   In Person  Provider:   You may see Dr. Bettina Gavia or the following Advanced Practice Provider on your designated Care Team:    Laurann Montana, FNP

## 2019-05-13 ENCOUNTER — Ambulatory Visit: Payer: Medicare Other | Attending: Internal Medicine

## 2019-05-13 DIAGNOSIS — Z23 Encounter for immunization: Secondary | ICD-10-CM

## 2019-05-13 NOTE — Progress Notes (Signed)
   [id number]  Name:  Charles Lloyd.    MRN: [id number] DOB: [birth year]  05/13/2019  Charles Lloyd was observed post Covid-19 immunization for 15 minutes without incident. He was provided with Vaccine Information Sheet and instruction to access the V-Safe system.   Charles Lloyd was instructed to call 911 with any severe reactions post vaccine: Marland Kitchen Difficulty breathing  . Swelling of face and throat  . A fast heartbeat  . A bad rash all over body  . Dizziness and weakness   Immunizations Administered    Name Date Dose VIS Date Route   Pfizer COVID-19 Vaccine 05/13/2019 12:38 PM 0.3 mL 02/06/2019 Intramuscular   Manufacturer: [place]   Lot: [id number]   [place]: [id number]

## 2019-05-15 ENCOUNTER — Other Ambulatory Visit: Payer: Self-pay | Admitting: Pulmonary Disease

## 2019-05-19 ENCOUNTER — Encounter (HOSPITAL_COMMUNITY): Payer: Self-pay | Admitting: *Deleted

## 2019-05-19 ENCOUNTER — Telehealth (HOSPITAL_COMMUNITY): Payer: Self-pay | Admitting: *Deleted

## 2019-05-19 NOTE — Telephone Encounter (Signed)
Patient given detailed instructions per Myocardial Perfusion Study Information Sheet for the test on 05/25/19. Patient notified to arrive 15 minutes early and that it is imperative to arrive on time for appointment to keep from having the test rescheduled.  If you need to cancel or reschedule your appointment, please call the office within 24 hours of your appointment. . Patient verbalized understanding. Kirstie Peri

## 2019-05-25 ENCOUNTER — Other Ambulatory Visit: Payer: Self-pay

## 2019-05-25 ENCOUNTER — Ambulatory Visit (HOSPITAL_COMMUNITY): Payer: Medicare Other | Attending: Internal Medicine

## 2019-05-25 DIAGNOSIS — I110 Hypertensive heart disease with heart failure: Secondary | ICD-10-CM | POA: Diagnosis present

## 2019-05-25 DIAGNOSIS — I480 Paroxysmal atrial fibrillation: Secondary | ICD-10-CM | POA: Diagnosis present

## 2019-05-25 DIAGNOSIS — R9431 Abnormal electrocardiogram [ECG] [EKG]: Secondary | ICD-10-CM | POA: Diagnosis present

## 2019-05-25 DIAGNOSIS — Z7901 Long term (current) use of anticoagulants: Secondary | ICD-10-CM

## 2019-05-25 DIAGNOSIS — I2510 Atherosclerotic heart disease of native coronary artery without angina pectoris: Secondary | ICD-10-CM | POA: Diagnosis present

## 2019-05-25 DIAGNOSIS — D6869 Other thrombophilia: Secondary | ICD-10-CM | POA: Diagnosis present

## 2019-05-25 DIAGNOSIS — I5042 Chronic combined systolic (congestive) and diastolic (congestive) heart failure: Secondary | ICD-10-CM

## 2019-05-25 DIAGNOSIS — I2584 Coronary atherosclerosis due to calcified coronary lesion: Secondary | ICD-10-CM | POA: Insufficient documentation

## 2019-05-25 LAB — MYOCARDIAL PERFUSION IMAGING
LV dias vol: 121 mL (ref 62–150)
LV sys vol: 49 mL
Peak HR: 75 {beats}/min
Rest HR: 63 {beats}/min
SDS: 0
SRS: 0
SSS: 0
TID: 0.99

## 2019-05-25 MED ORDER — TECHNETIUM TC 99M TETROFOSMIN IV KIT
10.5000 | PACK | Freq: Once | INTRAVENOUS | Status: AC | PRN
  Administered 2019-05-25: 10.5 via INTRAVENOUS
  Filled 2019-05-25: qty 11

## 2019-05-25 MED ORDER — TECHNETIUM TC 99M TETROFOSMIN IV KIT
31.9000 | PACK | Freq: Once | INTRAVENOUS | Status: AC | PRN
  Administered 2019-05-25: 31.9 via INTRAVENOUS
  Filled 2019-05-25: qty 32

## 2019-05-25 MED ORDER — REGADENOSON 0.4 MG/5ML IV SOLN
0.4000 mg | Freq: Once | INTRAVENOUS | Status: AC
  Administered 2019-05-25: 0.4 mg via INTRAVENOUS

## 2019-05-26 ENCOUNTER — Other Ambulatory Visit: Payer: Self-pay | Admitting: Pulmonary Disease

## 2019-05-26 ENCOUNTER — Telehealth: Payer: Self-pay | Admitting: Cardiology

## 2019-05-26 NOTE — Telephone Encounter (Signed)
I spoke with patient and reviewed stress test results with him 

## 2019-05-26 NOTE — Telephone Encounter (Signed)
Patient is returning phone call in regards to results. Please advise.

## 2019-06-15 ENCOUNTER — Other Ambulatory Visit: Payer: Self-pay | Admitting: Pulmonary Disease

## 2019-06-16 ENCOUNTER — Ambulatory Visit: Payer: Medicare Other | Admitting: Cardiology

## 2019-06-16 ENCOUNTER — Other Ambulatory Visit: Payer: Self-pay

## 2019-06-16 ENCOUNTER — Encounter: Payer: Self-pay | Admitting: Cardiology

## 2019-06-16 VITALS — BP 122/64 | HR 61 | Ht 70.0 in | Wt 189.4 lb

## 2019-06-16 DIAGNOSIS — I4819 Other persistent atrial fibrillation: Secondary | ICD-10-CM

## 2019-06-16 NOTE — Progress Notes (Signed)
Electrophysiology Office Note   Date:  06/16/2019   ID:  Charles Violett., DOB Dec 27, 1940, MRN [id number]  PCP:  Charles Bliss, [name], MD  Cardiologist:  Charles Lloyd Primary Electrophysiologist:  Charles Meredith Leeds, MD    Chief Complaint: atrial fibrillation initial   History of Present Illness: Charles Wilmott. is a 79 y.o. male who is being seen today for the evaluation of atrial fibrillation at the request of [name] Presenting today for electrophysiology evaluation.  He has a history of atrial fibrillation, moderate systolic heart failure, diabetes, hypertension, hyperlipidemia.  He had an ablation in [place] in 2015 with a cardioversion in 2017 and then a repeat cardioversion September 2019.  He is currently on Eliquis and rate controlled with diltiazem and metoprolol.  He has been having significant nighttime shortness of breath where he has to sit on the edge of the bed.  His diuretics have been increased.  He had an attempt at cardioversion 10/10/2018 but unfortunately failed to convert.  He is now status post AF ablation 03/12/2019.  Today, denies symptoms of palpitations, chest pain, shortness of breath, orthopnea, PND, lower extremity edema, claudication, dizziness, presyncope, syncope, bleeding, or neurologic sequela. The patient is tolerating medications without difficulties.  Overall he has done well since his ablation.  He did have issues with volume overload, but had a higher dose of torsemide and lost all of the volume within 4 to 5 days.  He currently feels well without complaints.  He is able to do all of his daily activities, and has been getting stronger over the past few months.   Past Medical History:  Diagnosis Date  . B12 deficiency   . 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 Cancer ([place])   . Chronic anticoagulation   . Chronic diastolic (congestive) heart failure (HCC) 04/16/2013   Functional class II, ejection fraction 35-40%  Last  Assessment & Plan:  Clinically stable Volume well controlled meds reviewed  . Colon polyps 04/16/2013  . Diabetic neuropathy ([place])   . Essential hypertension 01/06/2010   Last Assessment & Plan:  Well controlled Continue med management  . 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  Steroid usage was discontinued several months ago.  Follow up with Dermatology as planned.  . Hyperlipidemia associated with type 2 diabetes mellitus ([place]) 01/28/2018  . Infection of prosthetic right knee joint ([place])   . 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 On amiodarone therapy 09/07/2013  . On continuous oral anticoagulation 01/28/2018  . Overweight (BMI 25.0-29.9) 10/22/2016  . Persistent atrial fibrillation ([place]) 04/16/2013   Last Assessment & Plan:  Rate controlled Continue med management eliquis for stroke preventino  Formatting of this note might be different from the original.  Drug  HX Current Rx Pre-ABL inefficacy Pre-ABL intolerant Post-ABL inefficacy Post-ABL intolerant max dose/24h M/Y end comments  sotalol                  dofetilide                  flecainide                  propafenone  am  . S/P TKR (total knee replacement), bilateral   . Tinea cruris 04/16/2013  . Type 2 diabetes mellitus with diabetic polyneuropathy, without long-term current use of insulin ([place]) 04/16/2013   Last Assessment & Plan:  Labs today Pt reports well controlled on ambulatory monitoring   Past Surgical History:  Procedure Laterality Date  . ATRIAL FIBRILLATION ABLATION N/A 03/12/2019   Procedure: ATRIAL FIBRILLATION ABLATION;  Surgeon: [name], MD;  Location: [place] [facility];  Service: Cardiovascular;  Laterality: N/A;  . CARDIAC ELECTROPHYSIOLOGY STUDY AND ABLATION    . CARDIOVERSION    . CARDIOVERSION N/A 10/10/2018    Procedure: CARDIOVERSION;  Surgeon: [name], MD;  Location: [facility] ENDOSCOPY;  Service: Cardiovascular;  Laterality: N/A;  . CHOLECYSTECTOMY    . PROSTATECTOMY    . REPLACEMENT TOTAL KNEE BILATERAL       Current Outpatient Medications  Medication Sig Dispense Refill  . albuterol (VENTOLIN HFA) 108 (90 Base) MCG/ACT inhaler Inhale 2 puffs into the lungs every 6 (six) hours as needed for wheezing or shortness of breath. 8 g 2  . arformoterol (BROVANA) 15 MCG/2ML NEBU Take 2 mLs (15 mcg total) by nebulization in the morning and at bedtime. DX:J45.909 120 mL 1  . budesonide (PULMICORT) 0.5 MG/2ML nebulizer solution TAKE 2 MLS (0.5 MG TOTAL) BY NEBULIZATION IN THE MORNING AND AT BEDTIME. 120 mL 12  . diltiazem (CARDIZEM CD) 360 MG 24 hr capsule TAKE 1 CAPSULE DAILY 90 capsule 1  . ELIQUIS 5 MG TABS tablet TAKE 1 TABLET TWICE A DAY (Patient taking differently: Take 5 mg by mouth 2 (two) times daily. ) 180 tablet 3  . fluticasone (FLONASE) 50 MCG/ACT nasal spray Place 2 sprays into both nostrils daily. (Patient taking differently: Place 2 sprays into both nostrils daily as needed for allergies. ) 16 g 2  . gabapentin (NEURONTIN) 300 MG capsule TAKE 2 CAPSULES AT BEDTIME 180 capsule 1  . glucose blood ([employer] TEST STRIPS) test strip Test 2x dailyDx: type 2 diabetes, uncontrolledfluctuating blood sugars 100 each 0  . glucose blood test strip Use to test blood sugars once daily as directed Dx:E11.65    . KLOR-CON M20 20 MEQ tablet TAKE 1 TABLET BY MOUTH EVERY DAY 30 tablet 3  . metFORMIN (GLUCOPHAGE) 500 MG tablet TAKE 1 TABLET TWICE A DAY WITH MEALS (Patient taking differently: Take 500 mg by mouth 2 (two) times daily. ) 180 tablet 1  . metoprolol tartrate (LOPRESSOR) 50 MG tablet Take 2 tablets (100 mg total) by mouth 2 (two) times daily. 180 tablet 1  . montelukast (SINGULAIR) 10 MG tablet Take 10 mg by mouth at bedtime.     . torsemide (DEMADEX) 20 MG tablet Take 1 tablet (20 mg total) by mouth  2 (two) times daily. 180 tablet 3   No current facility-administered medications for this visit.    Allergies:   Patient has no known allergies.   Social History:  The patient  reports that he has never smoked. He has never used smokeless tobacco. He reports current alcohol use of about 2.0 standard drinks of alcohol per week. He reports that he does not use drugs.   Family History:  The patient's family history includes Cancer in his father and mother; Depression in his father and mother; Early death in his father and mother.    ROS:  Please see the history of present illness.   Otherwise, review of systems is positive for none.   All other  systems are reviewed and negative.   PHYSICAL EXAM: VS:  BP 122/64   Pulse 61   Ht 5\' 10"  (1.778 m)   Wt 189 lb 6.4 oz (85.9 kg)   SpO2 98%   BMI 27.18 kg/m  , BMI Body mass index is 27.18 kg/m. GEN: Well nourished, well developed, in no acute distress  HEENT: normal  Neck: no JVD, carotid bruits, or masses Cardiac: RRR; no murmurs, rubs, or gallops,no edema  Respiratory:  clear to auscultation bilaterally, normal work of breathing GI: soft, nontender, nondistended, + BS MS: no deformity or atrophy  Skin: warm and dry Neuro:  Strength and sensation are intact Psych: euthymic mood, full affect  EKG:  EKG is ordered today. Personal review of the ekg ordered shows sinus rhythm, rate 61  Recent Labs: 07/24/2018: TSH 0.87 11/06/2018: NT-Pro BNP 1,062 01/05/2019: B Natriuretic Peptide 590.7 01/06/2019: ALT 21; Magnesium 1.8 03/09/2019: Hemoglobin 11.7; Platelets 230 04/09/2019: BUN 11; Creatinine, Ser 0.98; Potassium 3.6; Sodium 136    Lipid Panel     Component Value Date/Time   CHOL 135 04/21/2019 0940   TRIG 81 04/21/2019 0940   HDL 41 04/21/2019 0940   CHOLHDL 3.3 04/21/2019 0940   CHOLHDL 3 07/24/2018 1058   VLDL 25.0 07/24/2018 1058   LDLCALC 78 04/21/2019 0940     Wt Readings from Last 3 Encounters:  06/16/19 189 lb 6.4 oz  (85.9 kg)  05/25/19 189 lb (85.7 kg)  05/06/19 189 lb (85.7 kg)      Other studies Reviewed: Additional studies/ records that were reviewed today include: TTE 10/07/18  Review of the above records today demonstrates:   1. The left ventricle has low normal systolic function, with an ejection fraction of 50-55%. The cavity size was normal. Left ventricular diastolic function could not be evaluated secondary to atrial fibrillation. No evidence of left ventricular  regional wall motion abnormalities.  2. The right ventricle has normal systolic function. The cavity was normal. There is no increase in right ventricular wall thickness. Right ventricular systolic pressure is normal.  3. There is mild mitral annular calcification present.  4. The aortic valve is tricuspid. Moderate sclerosis of the aortic valve. Aortic valve regurgitation was not assessed by color flow Doppler.  5. The aorta is normal in size and structure.  6. There is right bowing of the interatrial septum, suggestive of elevated left atrial pressure.  Myoview 05/25/19  Nuclear stress EF: 60%.  No T wave inversion was noted during stress.  There was no ST segment deviation noted during stress.  This is a low risk study.   Normal perfusion. LVEF 60% with normal wall motion. This is a low risk study. No prior for comparison.  ASSESSMENT AND PLAN:  1.  Persistent atrial fibrillation: Currently on Eliquis.  Status post AF ablation 03/12/2019.  CHA2DS2-VASc of 4.  He remains in sinus rhythm.  He currently has no complaints of atrial fibrillation.  He has had no further episodes since his ablation.  2.  Chronic systolic heart failure: Ejection fraction improved to normal.  No obvious volume overload.  3.  Hypertension: Currently well controlled  4.  Hyperlipidemia: Continue statin per primary cardiology    Current medicines are reviewed at length with the patient today.   The patient does not have concerns regarding his  medicines.  The following changes were made today: None  Labs/ tests ordered today include:  Orders Placed This Encounter  Procedures  . EKG 12-Lead  Disposition:   FU with Charles Camnitz 3 months  Signed, Charles Meredith Leeds, MD  06/16/2019 10:45 AM     [facility] [address] [place] [address] [phone] (office) [phone] (fax)

## 2019-06-25 ENCOUNTER — Other Ambulatory Visit: Payer: Self-pay | Admitting: Cardiology

## 2019-06-25 DIAGNOSIS — R0602 Shortness of breath: Secondary | ICD-10-CM

## 2019-06-25 MED ORDER — POTASSIUM CHLORIDE CRYS ER 20 MEQ PO TBCR: 20.0000 meq | EXTENDED_RELEASE_TABLET | Freq: Every day | ORAL | 3 refills | Status: DC

## 2019-06-25 NOTE — Telephone Encounter (Signed)
Refill sent in per request.  

## 2019-06-25 NOTE — Telephone Encounter (Signed)
New message   *STAT* If patient is at the pharmacy, call can be transferred to refill team.   1. Which medications need to be refilled? (please list name of each medication and dose if known) KLOR-CON M20 20 MEQ tablet  2. Which pharmacy/location (including street and city if local pharmacy) is medication to be sent to? [id number] - [place], [place]  3. Do they need a 30 day or 90 day supply? 90 day

## 2019-06-28 ENCOUNTER — Other Ambulatory Visit: Payer: Self-pay | Admitting: Internal Medicine

## 2019-06-29 NOTE — Telephone Encounter (Signed)
atorvastatin (LIPITOR) 20 MG tablet   90 day supply   [place], [place] Phone:  [phone]  Fax:  [phone]

## 2019-06-30 ENCOUNTER — Other Ambulatory Visit: Payer: Self-pay

## 2019-06-30 ENCOUNTER — Other Ambulatory Visit (HOSPITAL_COMMUNITY): Payer: Medicare Other

## 2019-06-30 NOTE — Progress Notes (Signed)
Cardiology Office Note:    Date:  07/01/2019   ID:  Charles Gum., DOB 1941-01-22, MRN [id number]  PCP:  Isaac Bliss, [name], MD  Cardiologist:  [name], MD    Referring MD: Isaac Bliss, [place]*    ASSESSMENT:    1. Paroxysmal atrial fibrillation (HCC)   2. Chronic anticoagulation   3. Chronic combined systolic and diastolic heart failure ([place])   4. Hypertensive heart disease with heart failure ([place])    PLAN:    In order of problems listed above:  1. Improved maintaining sinus rhythm discontinue calcium channel blocker continue metoprolol anticoagulant. 2. 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 BP at target withdrawal Cardizem continue beta-blocker loop diuretic   Next appointment: 6 months   Medication Adjustments/Labs and Tests Ordered: Current medicines are reviewed at length with the patient today.  Concerns regarding medicines are outlined above.  No orders of the defined types were placed in this encounter.  No orders of the defined types were placed in this encounter.   Chief Complaint  Patient presents with  . Follow-up  . Atrial Fibrillation  . Congestive Heart Failure    History of Present Illness:    Charles Schickling. is a 79 y.o. male with a hx of persistent  atrial fibrillation with initial ablation in 2015 at [facility], subsequently had cardioversion in 2017 and again repeat electrical cardioversion in September 2019, heart failure EF 40%, hypertension and hyperlipidemia. He is maintained on chronic anticoagulation with Eliquis referred to EP for consideration of pulmonary vein isolation.  He had pulmonary vein isolation again Dr. Curt Bears 03/12/2019 successfully resuming sinus rhythm.  Ejection fraction 50 to 55% by echo in atrial fibrillation.  He was last seen 05/06/2019. Compliance with diet,  lifestyle and medications: Yes  He is improved but still fatigue and some exercise intolerance which may be related to combined calcium channel blocker beta-blocker suppressing his sinus node.  Blood pressure is low we will stop his calcium channel blocker encouraged him to buy the iPhone EKG adapter and record his heart rhythm several times a week.  He continues his anticoagulant no bleeding complication he takes a diuretic once a day without edema orthopnea or shortness of breath.  He had a myocardial perfusion study performed 05/25/2019.  I personally reviewed and agree with the conclusions. Study Highlights   Nuclear stress EF: 60%.  No T wave inversion was noted during stress.  There was no ST segment deviation noted during stress.  This is a low risk study. Normal perfusion. LVEF 60% with normal wall motion. This is a low risk study. No prior for comparison.   EKG 06/16/2019 personally reviewed sinus rhythm first-degree AV block. Past Medical History:  Diagnosis Date  . Acute tracheobronchitis 01/05/2019  . B12 deficiency   . 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 Cancer ([place])   . 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 CHF (congestive heart failure) ([place]) 04/16/2013   Functional class II, ejection fraction 35-40%  Formatting of this note might be different from the original. Functional class II, ejection fraction 35-40%  . Chronic anticoagulation   . Chronic diastolic (congestive) heart failure (HCC) 04/16/2013   Functional class II, ejection fraction 35-40%  Last Assessment & Plan:  Clinically stable Volume well controlled meds reviewed  . Chronic  diastolic CHF (congestive heart failure) ([place]) 04/16/2013   Functional class II, ejection fraction 35-40%  Last Assessment & Plan:  Clinically stable Volume well controlled meds reviewed  . Colon polyps 04/16/2013  . Diabetic neuropathy ([place])   . Eosinophil count raised 02/17/2019  .  Essential hypertension 01/06/2010   Last Assessment & Plan:  Well controlled Continue med management  . 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  Steroid usage was discontinued several months ago.  Follow up with Dermatology as planned.  . Hypercholesterolemia 01/06/2010   Last Assessment & Plan:  Repeat labs recommended  . Hyperlipidemia associated with type 2 diabetes mellitus ([place]) 01/28/2018  . Hypertensive heart disease with heart failure ([place]) 01/06/2010   Last Assessment & Plan:  Well controlled Continue med management  . Hyponatremia 12/17/2018  . Infected prosthetic knee joint ([place]) 06/24/2017   Last Assessment & Plan:  Id following ?ongoing abx managementy reported Request records  . Infection of prosthetic right knee joint ([place])   . 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 Lesion of liver 04/20/2019   -On cardiac CT 02/2019  . Mild reactive airways disease   . 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 On amiodarone therapy 09/07/2013  . On continuous oral anticoagulation 01/28/2018  . Other activity(E029.9) 04/20/2013   Formatting of this note might be different from the original. Transthoracic Echocardiogram-03/10/[facility]: Normal left ventricular wall thickness and cavity size.  Global left ventricular systolic function is moderately reduced.  The estimated ejection fraction is 35-40%.  The left atrium is moderately enlarged.  The right atrium is mildly enlarged.  No significant valvular   . Overweight (BMI 25.0-29.9) 10/22/2016  . Persistent atrial fibrillation ([place]) 04/16/2013   Last Assessment & Plan:  Rate controlled Continue med management eliquis for  stroke preventino  Formatting of this note might be different from the original.  Drug  HX Current Rx Pre-ABL inefficacy Pre-ABL intolerant Post-ABL inefficacy Post-ABL intolerant max dose/24h M/Y end comments  sotalol                  dofetilide                  flecainide                  propafenone                  am  . S/P TKR (total knee replacement), bilateral   . Secondary hypercoagulable state ([place]) 04/09/2019  . Tinea cruris 04/16/2013  . Type 2 diabetes mellitus with diabetic polyneuropathy, without long-term current use of insulin ([place]) 04/16/2013   Last Assessment & Plan:  Labs today Pt reports well controlled on ambulatory monitoring  . Vitamin D deficiency 07/25/2018    Past Surgical History:  Procedure Laterality Date  . ATRIAL FIBRILLATION ABLATION N/A 03/12/2019   Procedure: ATRIAL FIBRILLATION ABLATION;  Surgeon: [name], MD;  Location: [facility];  Service: Cardiovascular;  Laterality: N/A;  . CARDIAC ELECTROPHYSIOLOGY STUDY AND ABLATION    . CARDIOVERSION    . CARDIOVERSION N/A 10/10/2018   Procedure: CARDIOVERSION;  Surgeon:  [name], MD;  Location: [facility] ENDOSCOPY;  Service: Cardiovascular;  Laterality: N/A;  . CHOLECYSTECTOMY    . PROSTATECTOMY    . REPLACEMENT TOTAL KNEE BILATERAL      Current Medications: Current Meds  Medication Sig  . albuterol (VENTOLIN HFA) 108 (90 Base) MCG/ACT inhaler Inhale 2 puffs into the lungs every 6 (six) hours as needed for wheezing or shortness of breath.  Marland Kitchen arformoterol (BROVANA) 15 MCG/2ML NEBU Take 2 mLs (15 mcg total) by nebulization in the morning and at bedtime. [id number]  . atorvastatin (LIPITOR) 20 MG tablet TAKE 1 TABLET DAILY AT 6 P.M.  . budesonide (PULMICORT) 1 MG/2ML nebulizer solution   . diltiazem (CARDIZEM CD) 360 MG 24 hr capsule TAKE 1 CAPSULE DAILY  . ELIQUIS 5 MG TABS tablet TAKE 1 TABLET TWICE A DAY  . fluticasone (FLONASE) 50 MCG/ACT nasal spray Place 2 sprays into both nostrils daily.    Marland Kitchen gabapentin (NEURONTIN) 300 MG capsule TAKE 2 CAPSULES AT BEDTIME  . glucose blood ([employer] TEST STRIPS) test strip Test 2x dailyDx: type 2 diabetes, uncontrolledfluctuating blood sugars  . glucose blood test strip Use to test blood sugars once daily as directed Dx:E11.65  . metFORMIN (GLUCOPHAGE) 500 MG tablet TAKE 1 TABLET TWICE A DAY WITH MEALS  . metoprolol tartrate (LOPRESSOR) 50 MG tablet Take 2 tablets (100 mg total) by mouth 2 (two) times daily.  . montelukast (SINGULAIR) 10 MG tablet Take 10 mg by mouth at bedtime.   . potassium chloride SA (KLOR-CON M20) 20 MEQ tablet Take 1 tablet (20 mEq total) by mouth daily.  Marland Kitchen torsemide (DEMADEX) 20 MG tablet Take 1 tablet (20 mg total) by mouth 2 (two) times daily.     Allergies:   Patient has no known allergies.   Social History   Socioeconomic History  . Marital status: Widowed    Spouse name: Not on file  . Number of children: Not on file  . Years of education: Not on file  . Highest education level: Not on file  Occupational History  . Not on file  Tobacco Use  . Smoking status: Never Smoker  . Smokeless tobacco: Never Used  Substance and Sexual Activity  . Alcohol use: Yes    Alcohol/week: 2.0 standard drinks    Types: 2 Glasses of wine per week    Comment: daily  . Drug use: Never  . Sexual activity: Not Currently  Other Topics Concern  . Not on file  Social History Narrative  . Not on file   Social Determinants of Health   Financial Resource Strain:   . Difficulty of Paying Living Expenses:   Food Insecurity:   . Worried About Charity fundraiser in the Last Year:   . Arboriculturist in the Last Year:   Transportation Needs:   . Film/video editor (Medical):   Marland Kitchen Lack of Transportation (Non-Medical):   Physical Activity:   . Days of Exercise per Week:   . Minutes of Exercise per Session:   Stress:   . Feeling of Stress :   Social Connections:   . Frequency of Communication with Friends and Family:   .  Frequency of Social Gatherings with Friends and Family:   . Attends Religious Services:   . Active Member of Clubs or Organizations:   . Attends Archivist Meetings:   Marland Kitchen Marital Status:      Family History: The patient's family history includes Cancer in his father and  mother; Depression in his father and mother; Early death in his father and mother. ROS:   Please see the history of present illness.    All other systems reviewed and are negative.  EKGs/Labs/Other Studies Reviewed:    The following studies were reviewed today:    Recent Labs: 07/24/2018: TSH 0.87 11/06/2018: NT-Pro BNP 1,062 01/05/2019: B Natriuretic Peptide 590.7 01/06/2019: ALT 21; Magnesium 1.8 03/09/2019: Hemoglobin 11.7; Platelets 230 04/09/2019: BUN 11; Creatinine, Ser 0.98; Potassium 3.6; Sodium 136  Recent Lipid Panel    Component Value Date/Time   CHOL 135 04/21/2019 0940   TRIG 81 04/21/2019 0940   HDL 41 04/21/2019 0940   CHOLHDL 3.3 04/21/2019 0940   CHOLHDL 3 07/24/2018 1058   VLDL 25.0 07/24/2018 1058   LDLCALC 78 04/21/2019 0940    Physical Exam:    VS:  BP (!) 116/56   Pulse 63   Temp 97.9 F (36.6 C)   Ht 5\' 10"  (1.778 m)   Wt 189 lb 1.3 oz (85.8 kg)   SpO2 98%   BMI 27.13 kg/m     Wt Readings from Last 3 Encounters:  07/01/19 189 lb 1.3 oz (85.8 kg)  06/16/19 189 lb 6.4 oz (85.9 kg)  05/25/19 189 lb (85.7 kg)     GEN:  Well nourished, well developed in no acute distress HEENT: Normal NECK: No JVD; No carotid bruits LYMPHATICS: No lymphadenopathy CARDIAC: RRR, no murmurs, rubs, gallops RESPIRATORY:  Clear to auscultation without rales, wheezing or rhonchi  ABDOMEN: Soft, non-tender, non-distended MUSCULOSKELETAL:  No edema; No deformity  SKIN: Warm and dry NEUROLOGIC:  Alert and oriented x 3 PSYCHIATRIC:  Normal affect    Signed, [name], MD  07/01/2019 1:33 PM    [facility]

## 2019-07-01 ENCOUNTER — Other Ambulatory Visit: Payer: Self-pay

## 2019-07-01 ENCOUNTER — Ambulatory Visit: Payer: Medicare Other | Admitting: Cardiology

## 2019-07-01 ENCOUNTER — Encounter: Payer: Self-pay | Admitting: Cardiology

## 2019-07-01 VITALS — BP 116/56 | HR 63 | Temp 97.9°F | Ht 70.0 in | Wt 189.1 lb

## 2019-07-01 DIAGNOSIS — I110 Hypertensive heart disease with heart failure: Secondary | ICD-10-CM | POA: Diagnosis not present

## 2019-07-01 DIAGNOSIS — Z7901 Long term (current) use of anticoagulants: Secondary | ICD-10-CM

## 2019-07-01 DIAGNOSIS — I5042 Chronic combined systolic (congestive) and diastolic (congestive) heart failure: Secondary | ICD-10-CM | POA: Diagnosis not present

## 2019-07-01 DIAGNOSIS — I2584 Coronary atherosclerosis due to calcified coronary lesion: Secondary | ICD-10-CM

## 2019-07-01 DIAGNOSIS — I480 Paroxysmal atrial fibrillation: Secondary | ICD-10-CM

## 2019-07-01 DIAGNOSIS — D6869 Other thrombophilia: Secondary | ICD-10-CM

## 2019-07-01 NOTE — Patient Instructions (Addendum)
Medication Instructions:  Your physician has recommended you make the following change in your medication:  STOP: Cardizem *If you need a refill on your cardiac medications before your next appointment, please call your pharmacy*   Lab Work: Your physician recommends that you return for lab work in: TODAY BMP If you have labs (blood work) drawn today and your tests are completely normal, you will receive your results only by: Marland Kitchen MyChart Message (if you have MyChart) OR . A paper copy in the mail If you have any lab test that is abnormal or we need to change your treatment, we will call you to review the results.   Testing/Procedures: None   Follow-Up: At [facility], you and your health needs are our priority.  As part of our continuing mission to provide you with exceptional heart care, we have created designated Provider Care Teams.  These Care Teams include your primary Cardiologist (physician) and Advanced Practice Providers (APPs -  Physician Assistants and Nurse Practitioners) who all work together to provide you with the care you need, when you need it.  We recommend signing up for the patient portal called "MyChart".  Sign up information is provided on this After Visit Summary.  MyChart is used to connect with patients for Virtual Visits (Telemedicine).  Patients are able to view lab/test results, encounter notes, upcoming appointments, etc.  Non-urgent messages can be sent to your provider as well.   To learn more about what you can do with MyChart, go to [email or web address].    Your next appointment:   6 month(s)  The format for your next appointment:   In Person  Provider:   [name], MD   Other Instructions Please look into purchasing the Kardia mobile adapter and check your heart rate and rhythm 2-3 times per week.   KardiaMobile [email or web address]        FDA-cleared, clinical grade mobile EKG monitor: Jodelle Red is the  most clinically-validated mobile EKG used by the world's leading cardiac care medical professionals With Basic service, know instantly if your heart rhythm is normal or if atrial fibrillation is detected, and email the last single EKG recording to yourself or your doctor Premium service, available for purchase through the Kardia app for $9.99 per month or $99 per year, includes unlimited history and storage of your EKG recordings, a monthly EKG summary report to share with your doctor, along with the ability to track your blood pressure, activity and weight Includes one KardiaMobile phone clip FREE SHIPPING: Standard delivery 1-3 business days. Orders placed by 11:00am PST will ship that afternoon. Otherwise, will ship next business day. All orders ship via [employer] from [place], [place]

## 2019-07-02 ENCOUNTER — Telehealth: Payer: Self-pay

## 2019-07-02 DIAGNOSIS — I5032 Chronic diastolic (congestive) heart failure: Secondary | ICD-10-CM

## 2019-07-02 DIAGNOSIS — I110 Hypertensive heart disease with heart failure: Secondary | ICD-10-CM

## 2019-07-02 LAB — BASIC METABOLIC PANEL
BUN/Creatinine Ratio: 15 (ref 10–24)
BUN: 25 mg/dL (ref 8–27)
CO2: 26 mmol/L (ref 20–29)
Calcium: 9.3 mg/dL (ref 8.6–10.2)
Chloride: 101 mmol/L (ref 96–106)
Creatinine, Ser: 1.7 mg/dL — ABNORMAL HIGH (ref 0.76–1.27)
GFR calc Af Amer: 44 mL/min/{1.73_m2} — ABNORMAL LOW (ref >59)
GFR calc non Af Amer: 38 mL/min/{1.73_m2} — ABNORMAL LOW (ref >59)
Glucose: 132 mg/dL — ABNORMAL HIGH (ref 65–99)
Potassium: 4 mmol/L (ref 3.5–5.2)
Sodium: 145 mmol/L — ABNORMAL HIGH (ref 134–144)

## 2019-07-02 NOTE — Telephone Encounter (Signed)
-----   Message from Richardo Priest, MD sent at 07/02/2019  8:19 AM EDT ----- Normal or stable result  Kidney function is worsened I think he should reduce his torsemide to 1 tablet daily and will recheck a BMP in about 2 weeks this should normalize

## 2019-07-02 NOTE — Telephone Encounter (Signed)
Spoke with patient regarding results and recommendation.  Patient verbalizes understanding and is agreeable to plan of care. Advised patient to call back with any issues or concerns.   Order placed for repeat BMP.

## 2019-07-07 ENCOUNTER — Other Ambulatory Visit: Payer: Self-pay | Admitting: Internal Medicine

## 2019-07-17 ENCOUNTER — Telehealth: Payer: Self-pay

## 2019-07-17 LAB — BASIC METABOLIC PANEL
BUN/Creatinine Ratio: 14 (ref 10–24)
BUN: 20 mg/dL (ref 8–27)
CO2: 23 mmol/L (ref 20–29)
Calcium: 9.4 mg/dL (ref 8.6–10.2)
Chloride: 104 mmol/L (ref 96–106)
Creatinine, Ser: 1.43 mg/dL — ABNORMAL HIGH (ref 0.76–1.27)
GFR calc Af Amer: 55 mL/min/{1.73_m2} — ABNORMAL LOW (ref >59)
GFR calc non Af Amer: 47 mL/min/{1.73_m2} — ABNORMAL LOW (ref >59)
Glucose: 165 mg/dL — ABNORMAL HIGH (ref 65–99)
Potassium: 4.3 mmol/L (ref 3.5–5.2)
Sodium: 144 mmol/L (ref 134–144)

## 2019-07-17 NOTE — Telephone Encounter (Signed)
-----   Message from Richardo Priest, MD sent at 07/17/2019 11:45 AM EDT ----- Normal or stable result As expected he is improved continue his current diuretic

## 2019-07-17 NOTE — Telephone Encounter (Signed)
Spoke with patient regarding results and recommendation.  Patient verbalizes understanding and is agreeable to plan of care. Advised patient to call back with any issues or concerns.  

## 2019-07-21 ENCOUNTER — Other Ambulatory Visit: Payer: Self-pay

## 2019-07-22 ENCOUNTER — Encounter: Payer: Self-pay | Admitting: Family Medicine

## 2019-07-22 ENCOUNTER — Ambulatory Visit (INDEPENDENT_AMBULATORY_CARE_PROVIDER_SITE_OTHER): Payer: Medicare Other | Admitting: Family Medicine

## 2019-07-22 VITALS — BP 132/62 | HR 64 | Temp 98.3°F | Wt 189.0 lb

## 2019-07-22 DIAGNOSIS — L03032 Cellulitis of left toe: Secondary | ICD-10-CM | POA: Diagnosis not present

## 2019-07-22 DIAGNOSIS — L03116 Cellulitis of left lower limb: Secondary | ICD-10-CM | POA: Diagnosis not present

## 2019-07-22 MED ORDER — CEPHALEXIN 500 MG PO CAPS
500.0000 mg | ORAL_CAPSULE | Freq: Four times a day (QID) | ORAL | 0 refills | Status: DC
Start: 2019-07-22 — End: 2019-09-24

## 2019-07-22 NOTE — Progress Notes (Signed)
Subjective:     Patient ID: Charles Gum., male   DOB: 08/21/1940, 79 y.o.   MRN: [id number]  HPI   Patient is seen as a work in with some redness left foot and especially left second toe.  Denies any injury.  Symptoms started about 5 days ago.  He does not have any history of gout.  His son initially thought this may be gout.  He has pain with ambulation.  No fevers or chills.  No obvious breaks in the skin.  No recent change of shoewear.  No recent foot rashes (eg tinea pedis).  Symptoms have progressed from second toe to foot past two days.  He has multiple chronic problems including history of congestive heart failure, atrial fibrillation, type 2 diabetes with peripheral neuropathy.  Past Medical History:  Diagnosis Date  . Acute tracheobronchitis 01/05/2019  . B12 deficiency   . 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 Cancer ([place])   . Cardiomyopathy ([place]) 03/10/2018   With chronic atrial fibrillation  . CHF (congestive heart failure) ([place]) 04/16/2013   Functional class II, ejection fraction 35-40%  Formatting of this note might be different from the original. Functional class II, ejection fraction 35-40%  . Chronic anticoagulation   . Chronic diastolic (congestive) heart failure (HCC) 04/16/2013   Functional class II, ejection fraction 35-40%  Last Assessment & Plan:  Clinically stable Volume well controlled meds reviewed  . Chronic diastolic CHF (congestive heart failure) ([place]) 04/16/2013   Functional class II, ejection fraction 35-40%  Last Assessment & Plan:  Clinically stable Volume well controlled meds reviewed  . Colon polyps 04/16/2013  . Diabetic neuropathy ([place])   . Eosinophil count raised 02/17/2019  . Essential hypertension 01/06/2010   Last Assessment & Plan:  Well controlled Continue med management  . 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  Steroid usage was discontinued several months ago.   Follow up with Dermatology as planned.  . Hypercholesterolemia 01/06/2010   Last Assessment & Plan:  Repeat labs recommended  . Hyperlipidemia associated with type 2 diabetes mellitus ([place]) 01/28/2018  . Hypertensive heart disease with heart failure ([place]) 01/06/2010   Last Assessment & Plan:  Well controlled Continue med management  . Hyponatremia 12/17/2018  . 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 Infection of prosthetic right knee joint ([place])   . 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 Lesion of liver 04/20/2019   -On cardiac CT 02/2019  . Mild reactive airways disease   . 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 On amiodarone therapy 09/07/2013  . On continuous oral anticoagulation 01/28/2018  . Other activity(E029.9) 04/20/2013   Formatting of this note might be different from the original. Transthoracic Echocardiogram-[facility]/10/[facility]: Normal left ventricular wall thickness and cavity size.  Global left ventricular systolic function is moderately reduced.  The estimated ejection fraction is 35-40%.  The left atrium is moderately enlarged.  The right atrium is mildly enlarged.  No significant valvular   . Overweight (BMI 25.0-29.9) 10/22/2016  .  Persistent atrial fibrillation ([place]) 04/16/2013   Last Assessment & Plan:  Rate controlled Continue med management eliquis for stroke preventino  Formatting of this note might be different from the original.  Drug  HX Current Rx Pre-ABL inefficacy Pre-ABL intolerant Post-ABL inefficacy Post-ABL intolerant max dose/24h M/Y end comments  sotalol                  dofetilide                   flecainide                  propafenone                  am  . S/P TKR (total knee replacement), bilateral   . Secondary hypercoagulable state ([place]) 04/09/2019  . Tinea cruris 04/16/2013  . Type 2 diabetes mellitus with diabetic polyneuropathy, without long-term current use of insulin ([place]) 04/16/2013   Last Assessment & Plan:  Labs today Pt reports well controlled on ambulatory monitoring  . Vitamin D deficiency 07/25/2018   Past Surgical History:  Procedure Laterality Date  . ATRIAL FIBRILLATION ABLATION N/A 03/12/2019   Procedure: ATRIAL FIBRILLATION ABLATION;  Surgeon: [name], MD;  Location: [facility];  Service: Cardiovascular;  Laterality: N/A;  . CARDIAC ELECTROPHYSIOLOGY STUDY AND ABLATION    . CARDIOVERSION    . CARDIOVERSION N/A 10/10/2018   Procedure: CARDIOVERSION;  Surgeon: [name], MD;  Location: [facility] ENDOSCOPY;  Service: Cardiovascular;  Laterality: N/A;  . CHOLECYSTECTOMY    . PROSTATECTOMY    . REPLACEMENT TOTAL KNEE BILATERAL      reports that he has never smoked. He has never used smokeless tobacco. He reports current alcohol use of about 2.0 standard drinks of alcohol per week. He reports that he does not use drugs. family history includes Cancer in his father and mother; Depression in his father and mother; Early death in his father and mother. No Known Allergies   Review of Systems  Constitutional: Negative for appetite change, chills, fever and unexpected weight change.  Respiratory: Negative for shortness of breath.   Cardiovascular: Negative for chest pain.  Gastrointestinal: Negative for diarrhea, nausea and vomiting.  Hematological: Negative for adenopathy.       Objective:   Physical Exam Vitals reviewed.  Constitutional:      Appearance: Normal appearance.  Cardiovascular:     Rate and Rhythm: Normal rate.  Pulmonary:     Effort: Pulmonary effort is normal.     Breath sounds: Normal breath sounds.  Skin:    Comments:  Erythema involving most of the left second toe.  Minimal warmth to touch.  No obvious breaks in the skin.  This erythema extends to the distal forefoot on the left side.  Good capillary refill.  Feet are warm to touch with good distal pulses  Neurological:     Mental Status: He is alert.        Assessment:     5-day history of erythema, pain, swelling left second toe now extending past couple days to the foot.  Concern is cellulitis.  Patient had initial concern regarding gout but I think cellulitis is more likely    Plan:     -Start Keflex 500 mg 4 times daily for 10 days -Elevate foot frequently -Follow-up immediately for any fever or any progressive redness or swelling -Recommend follow-up with primary within 6 days to reassess and sooner as needed.  Eulas Post MD [facility]

## 2019-07-22 NOTE — Patient Instructions (Signed)

## 2019-07-28 ENCOUNTER — Other Ambulatory Visit: Payer: Self-pay

## 2019-07-28 ENCOUNTER — Encounter: Payer: Self-pay | Admitting: Internal Medicine

## 2019-07-28 ENCOUNTER — Ambulatory Visit (INDEPENDENT_AMBULATORY_CARE_PROVIDER_SITE_OTHER): Payer: Medicare Other | Admitting: Internal Medicine

## 2019-07-28 VITALS — BP 130/84 | HR 59 | Temp 97.5°F | Ht 70.0 in | Wt 189.6 lb

## 2019-07-28 DIAGNOSIS — E1142 Type 2 diabetes mellitus with diabetic polyneuropathy: Secondary | ICD-10-CM | POA: Diagnosis not present

## 2019-07-28 DIAGNOSIS — L03032 Cellulitis of left toe: Secondary | ICD-10-CM | POA: Diagnosis not present

## 2019-07-28 LAB — POCT GLYCOSYLATED HEMOGLOBIN (HGB A1C): Hemoglobin A1C: 6.3 % — AB (ref 4.0–5.6)

## 2019-07-28 NOTE — Progress Notes (Signed)
Established Patient Office Visit     This visit occurred during the SARS-CoV-2 public health emergency.  Safety protocols were in place, including screening questions prior to the visit, additional usage of staff PPE, and extensive cleaning of exam room while observing appropriate contact time as indicated for disinfecting solutions.    CC/Reason for Visit: Follow-up cellulitis  HPI: Charles Lloyd. is a 79 y.o. male who is coming in today for the above mentioned reasons.  He is here to follow-up on the cellulitis of his left foot second toe.  He was seen by another provider in the clinic last week.  Was diagnosed with cellulitis and placed on Keflex.  He still has about half of his course remaining.  Because he is a diabetic he was asked to follow-up with me today.  He states he has been doing much better, redness of the foot and toe has recessed.  He denies fevers.   Past Medical/Surgical History: Past Medical History:  Diagnosis Date  . Acute tracheobronchitis 01/05/2019  . B12 deficiency   . 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 Cancer ([place])   . Cardiomyopathy ([place]) 03/10/2018   With chronic atrial fibrillation  . CHF (congestive heart failure) ([place]) 04/16/2013   Functional class II, ejection fraction 35-40%  Formatting of this note might be different from the original. Functional class II, ejection fraction 35-40%  . Chronic anticoagulation   . Chronic diastolic (congestive) heart failure (HCC) 04/16/2013   Functional class II, ejection fraction 35-40%  Last Assessment & Plan:  Clinically stable Volume well controlled meds reviewed  . Chronic diastolic CHF (congestive heart failure) ([place]) 04/16/2013   Functional class II, ejection fraction 35-40%  Last Assessment & Plan:  Clinically stable Volume well controlled meds reviewed  . Colon polyps 04/16/2013  . Diabetic neuropathy ([place])   . Eosinophil count raised 02/17/2019  . Essential hypertension  01/06/2010   Last Assessment & Plan:  Well controlled Continue med management  . 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  Steroid usage was discontinued several months ago.  Follow up with Dermatology as planned.  . Hypercholesterolemia 01/06/2010   Last Assessment & Plan:  Repeat labs recommended  . Hyperlipidemia associated with type 2 diabetes mellitus ([place]) 01/28/2018  . Hypertensive heart disease with heart failure ([place]) 01/06/2010   Last Assessment & Plan:  Well controlled Continue med management  . Hyponatremia 12/17/2018  . 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 Infection of prosthetic right knee joint ([place])   . 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 Lesion of liver 04/20/2019   -On cardiac CT 02/2019  . Mild reactive airways disease   . 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 On amiodarone therapy 09/07/2013  . On continuous oral anticoagulation 01/28/2018  . Other activity(E029.9) 04/20/2013   Formatting of this note might be different from the original. Transthoracic Echocardiogram-03/10/2013-[facility]: Normal left ventricular wall thickness and cavity size.  Global left ventricular systolic function is moderately reduced.  The  estimated ejection fraction is 35-40%.  The left atrium is moderately enlarged.  The right atrium is mildly enlarged.  No significant valvular   . Overweight (BMI 25.0-29.9) 10/22/2016  . Persistent atrial fibrillation ([place]) 04/16/2013   Last Assessment & Plan:  Rate controlled Continue med management eliquis for stroke preventino   Formatting of this note might be different from the original.  Drug  HX Current Rx Pre-ABL inefficacy Pre-ABL intolerant Post-ABL inefficacy Post-ABL intolerant max dose/24h M/Y end comments  sotalol                  dofetilide                  flecainide                  propafenone                  am  . S/P TKR (total knee replacement), bilateral   . Secondary hypercoagulable state ([place]) 04/09/2019  . Tinea cruris 04/16/2013  . Type 2 diabetes mellitus with diabetic polyneuropathy, without long-term current use of insulin ([place]) 04/16/2013   Last Assessment & Plan:  Labs today Pt reports well controlled on ambulatory monitoring  . Vitamin D deficiency 07/25/2018    Past Surgical History:  Procedure Laterality Date  . ATRIAL FIBRILLATION ABLATION N/A 03/12/2019   Procedure: ATRIAL FIBRILLATION ABLATION;  Surgeon: [name], MD;  Location: [facility];  Service: Cardiovascular;  Laterality: N/A;  . CARDIAC ELECTROPHYSIOLOGY STUDY AND ABLATION    . CARDIOVERSION    . CARDIOVERSION N/A 10/10/2018   Procedure: CARDIOVERSION;  Surgeon: [name], MD;  Location: [facility] ENDOSCOPY;  Service: Cardiovascular;  Laterality: N/A;  . CHOLECYSTECTOMY    . PROSTATECTOMY    . REPLACEMENT TOTAL KNEE BILATERAL      Social History:  reports that he has never smoked. He has never used smokeless tobacco. He reports current alcohol use of about 2.0 standard drinks of alcohol per week. He reports that he does not use drugs.  Allergies: No Known Allergies  Family History:  Family History  Problem Relation Age of Onset  . Cancer Mother   . Depression Mother   . Early death Mother   . Cancer Father   . Depression Father   . Early death Father      Current Outpatient Medications:  .  albuterol (VENTOLIN HFA) 108 (90 Base) MCG/ACT inhaler, Inhale 2 puffs into the lungs every 6 (six) hours as needed for wheezing or shortness of breath., Disp: 8 g, Rfl: 2 .  arformoterol (BROVANA) 15  MCG/2ML NEBU, Take 2 mLs (15 mcg total) by nebulization in the morning and at bedtime. DX:J45.909, Disp: 120 mL, Rfl: 1 .  atorvastatin (LIPITOR) 20 MG tablet, TAKE 1 TABLET DAILY AT 6 P.M., Disp: 90 tablet, Rfl: 1 .  budesonide (PULMICORT) 1 MG/2ML nebulizer solution, , Disp: , Rfl:  .  cephALEXin (KEFLEX) 500 MG capsule, Take 1 capsule (500 mg total) by mouth 4 (four) times daily., Disp: 40 capsule, Rfl: 0 .  ELIQUIS 5 MG TABS tablet, TAKE 1 TABLET TWICE A DAY, Disp: 180 tablet, Rfl: 3 .  fluticasone (FLONASE) 50 MCG/ACT nasal spray, Place 2 sprays into both nostrils daily., Disp: 16 g, Rfl: 2 .  gabapentin (NEURONTIN) 300 MG capsule, TAKE 2 CAPSULES AT BEDTIME, Disp: 180 capsule, Rfl: 1 .  glucose blood ([employer] TEST STRIPS) test strip, Test 2x dailyDx: type 2 diabetes,  uncontrolledfluctuating blood sugars, Disp: 100 each, Rfl: 0 .  glucose blood test strip, Use to test blood sugars once daily as directed Dx:E11.65, Disp: , Rfl:  .  metFORMIN (GLUCOPHAGE) 500 MG tablet, TAKE 1 TABLET TWICE A DAY WITH MEALS, Disp: 180 tablet, Rfl: 1 .  metoprolol tartrate (LOPRESSOR) 50 MG tablet, TAKE 2 TABLETS (100 MG TOTAL) BY MOUTH 2 (TWO) TIMES DAILY., Disp: 360 tablet, Rfl: 1 .  montelukast (SINGULAIR) 10 MG tablet, Take 10 mg by mouth at bedtime. , Disp: , Rfl:  .  potassium chloride SA (KLOR-CON M20) 20 MEQ tablet, Take 1 tablet (20 mEq total) by mouth daily., Disp: 90 tablet, Rfl: 3 .  torsemide (DEMADEX) 20 MG tablet, Take 20 mg by mouth daily., Disp: , Rfl:   Review of Systems:  Constitutional: Denies fever, chills, diaphoresis, appetite change and fatigue.  HEENT: Denies photophobia, eye pain, redness, hearing loss, ear pain, congestion, sore throat, rhinorrhea, sneezing, mouth sores, trouble swallowing, neck pain, neck stiffness and tinnitus.   Respiratory: Denies SOB, DOE, cough, chest tightness,  and wheezing.   Cardiovascular: Denies chest pain, palpitations and leg swelling.  Gastrointestinal:  Denies nausea, vomiting, abdominal pain, diarrhea, constipation, blood in stool and abdominal distention.  Genitourinary: Denies dysuria, urgency, frequency, hematuria, flank pain and difficulty urinating.  Endocrine: Denies: hot or cold intolerance, sweats, changes in hair or nails, polyuria, polydipsia. Musculoskeletal: Denies myalgias, back pain, joint swelling, arthralgias and gait problem.  Skin: Denies pallor, rash and wound.  Neurological: Denies dizziness, seizures, syncope, weakness, light-headedness, numbness and headaches.  Hematological: Denies adenopathy. Easy bruising, personal or family bleeding history  Psychiatric/Behavioral: Denies suicidal ideation, mood changes, confusion, nervousness, sleep disturbance and agitation    Physical Exam: Vitals:   07/28/19 1124  BP: 130/84  Pulse: (!) 59  Temp: (!) 97.5 F (36.4 C)  TempSrc: Temporal  SpO2: 96%  Weight: 189 lb 9.6 oz (86 kg)  Height: 5\' 10"  (1.778 m)    Body mass index is 27.2 kg/m.   Constitutional: NAD, calm, comfortable Eyes: PERRL, lids and conjunctivae normal ENMT: Mucous membranes are moist. Skin: Mild erythema of left second toe Neurologic: Grossly intact and nonfocal Psychiatric: Normal judgment and insight. Alert and oriented x 3. Normal mood.    Impression and Plan:  Cellulitis of second toe of left foot -Per patient chart documentation is improved. -Advised to complete course of antibiotic therapy.  Type 2 diabetes mellitus with diabetic polyneuropathy, without long-term current use of insulin (HCC)  -Well-controlled with an A1c of 6.2 in office today.     Lelon Frohlich, MD [facility] at [facility]

## 2019-08-19 ENCOUNTER — Telehealth: Payer: Self-pay | Admitting: Pulmonary Disease

## 2019-08-19 MED ORDER — ARFORMOTEROL TARTRATE 15 MCG/2ML IN NEBU: 15.0000 ug | INHALATION_SOLUTION | Freq: Two times a day (BID) | RESPIRATORY_TRACT | 1 refills | Status: DC

## 2019-08-19 NOTE — Telephone Encounter (Signed)
OK to refill Given his prior history, pleae get him for OV with BM  or other APP to assess please

## 2019-08-19 NOTE — Telephone Encounter (Signed)
Patient called with a couple days of chest congestion with clear white bubbly phlem.  Sob sitting still, sats drop to low 80's.  He takes a Brovona nebulizer treatment and it comes back up to 95%.  Denies any fever, chills, nasal congestion.  He is requesting a refill of his brovona nebulizer.  Dr. Elsworth Soho please advise.

## 2019-08-19 NOTE — Telephone Encounter (Signed)
Ok, thank you.   Brovona refilled.  Patient will be out of town from 6/29-7/2, schedule an appointment with an Aaron Edelman for 7/12, earliest availability for App schedules and d/t patient being out of town.

## 2019-09-07 ENCOUNTER — Ambulatory Visit: Payer: Medicare Other | Admitting: Pulmonary Disease

## 2019-09-07 ENCOUNTER — Encounter: Payer: Self-pay | Admitting: Pulmonary Disease

## 2019-09-07 ENCOUNTER — Other Ambulatory Visit: Payer: Self-pay

## 2019-09-07 VITALS — BP 130/70 | HR 66 | Temp 97.3°F | Ht 71.0 in | Wt 188.0 lb

## 2019-09-07 DIAGNOSIS — J4520 Mild intermittent asthma, uncomplicated: Secondary | ICD-10-CM | POA: Diagnosis not present

## 2019-09-07 DIAGNOSIS — K769 Liver disease, unspecified: Secondary | ICD-10-CM

## 2019-09-07 DIAGNOSIS — J309 Allergic rhinitis, unspecified: Secondary | ICD-10-CM

## 2019-09-07 MED ORDER — BREO ELLIPTA 100-25 MCG/INH IN AEPB: 1.0000 | INHALATION_SPRAY | Freq: Every day | RESPIRATORY_TRACT | 0 refills | Status: DC

## 2019-09-07 NOTE — Progress Notes (Signed)
@Patient  ID: [name], male    DOB: [birth year], 79 y.o.   MRN: [id number]  Chief Complaint  Patient presents with  . Follow-up    mild persisent reactive airway disease.    Referring provider: Isaac Bliss, [place]*  HPI:  79 year old male never smoker initially consulted with our office on 02/06/2019 for recurrent bronchitis-like episodes as well as history of peripheral eosinophilia.  Patient was evaluated by Dr. Elsworth Soho and diagnosed with suspected reactive airways disease.  PMH: hypertension, hyperlipidemia, diabetes mellitus type 2, chronic atrial fibrillation on Eliquis (currently awaiting ablation), chronic diastolic congestive heart failure  Underwent DCCV 2 weeks ago Smoker/ Smoking History: Never smoker Maintenance: None Pt of: Dr. Elsworth Soho  09/07/2019  - Visit   79 year old male never smoker followed in our office for mild persistent reactive airways disease.  Followed by Dr. Elsworth Soho. At last office visit patient was evaluated by Dr. Elsworth Soho in February/2021.  It was recommended that patient follow-up with ear nose and throat.  There is recommended that if symptoms worsened he could contact our office and we can start [place].  Patient completing follow-up with our office today. He reports that he was recently evaluated by ear nose and throat he was started back on Singulair. He is also started on a nasal saline rinse with budesonide. Patient reported that he was reporting to ear nose and throat worsened postnasal drip as well as loss of sense of smell and taste. Patient was also given a prednisone taper. He reports that symptoms have improved significantly. He is following back up with ear nose and throat in 2 weeks.  He is not exactly sure why he is taking the [place]. We will review this today. He is open to being placed on Breo Ellipta in its place as that would be more convenient instead of the nebulized medication. He denies use of budesonide nebs.  Questionaires /  Pulmonary Flowsheets:   ACT:  No flowsheet data found.  MMRC: mMRC Dyspnea Scale mMRC Score  02/17/2019 0    Epworth:  No flowsheet data found.  Tests:   Echo 09/2018 showed normal EF Absolute eosinophil count 1700 on 11/9, 1800 on 10/21, 400 on 07/24/18   FENO:  No results found for: NITRICOXIDE  PFT: No flowsheet data found.  WALK:  No flowsheet data found.  Imaging: No results found.  Lab Results:  CBC    Component Value Date/Time   WBC 6.2 03/09/2019 1040   WBC 3.5 (L) 01/06/2019 0418   RBC 4.02 (L) 03/09/2019 1040   RBC 3.96 (L) 01/06/2019 0418   HGB 11.7 (L) 03/09/2019 1040   HCT 34.5 (L) 03/09/2019 1040   PLT 230 03/09/2019 1040   MCV 86 03/09/2019 1040   MCH 29.1 03/09/2019 1040   MCH 28.8 01/06/2019 0418   MCHC 33.9 03/09/2019 1040   MCHC 31.8 01/06/2019 0418   RDW 15.0 03/09/2019 1040   LYMPHSABS 0.8 01/05/2019 1242   MONOABS 0.5 01/05/2019 1242   EOSABS 1.7 (H) 01/05/2019 1242   BASOSABS 0.1 01/05/2019 1242    BMET    Component Value Date/Time   NA 144 07/16/2019 1304   K 4.3 07/16/2019 1304   CL 104 07/16/2019 1304   CO2 23 07/16/2019 1304   GLUCOSE 165 (H) 07/16/2019 1304   GLUCOSE 110 (H) 04/09/2019 1045   BUN 20 07/16/2019 1304   CREATININE 1.43 (H) 07/16/2019 1304   CALCIUM 9.4 07/16/2019 1304   GFRNONAA 47 (L) 07/16/2019 1304  GFRAA 55 (L) 07/16/2019 1304    BNP    Component Value Date/Time   BNP 590.7 (H) 01/05/2019 1242    ProBNP    Component Value Date/Time   PROBNP 1,062 (H) 11/06/2018 1005   PROBNP 289.0 (H) 09/22/2018 1107    Specialty Problems      Pulmonary Problems   Mild reactive airways disease   Acute tracheobronchitis   Allergic rhinitis      No Known Allergies  Immunization History  Administered Date(s) Administered  . Fluad Quad(high Dose 65+) 11/25/2018  . Influenza, High Dose Seasonal PF 11/21/2010, 01/19/2013, 02/01/2014, 02/07/2015, 12/28/2015, 10/22/2016, 01/28/2018  .  Influenza,trivalent, recombinat, inj, PF 01/11/2012  . Influenza-Unspecified 12/09/2009  . PFIZER SARS-COV-2 Vaccination 04/19/2019, 05/13/2019  . Pneumococcal Conjugate-13 01/19/2013  . Pneumococcal Polysaccharide-23 12/01/2008  . Tdap 10/28/2006  . Zoster 10/28/2006  . Zoster Recombinat (Shingrix) 01/28/2018, 07/24/2018    Past Medical History:  Diagnosis Date  . Acute tracheobronchitis 01/05/2019  . B12 deficiency   . 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 Cancer ([place])   . Cardiomyopathy ([place]) 03/10/2018   With chronic atrial fibrillation  . CHF (congestive heart failure) ([place]) 04/16/2013   Functional class II, ejection fraction 35-40%  Formatting of this note might be different from the original. Functional class II, ejection fraction 35-40%  . Chronic anticoagulation   . Chronic diastolic (congestive) heart failure (HCC) 04/16/2013   Functional class II, ejection fraction 35-40%  Last Assessment & Plan:  Clinically stable Volume well controlled meds reviewed  . Chronic diastolic CHF (congestive heart failure) ([place]) 04/16/2013   Functional class II, ejection fraction 35-40%  Last Assessment & Plan:  Clinically stable Volume well controlled meds reviewed  . Colon polyps 04/16/2013  . Diabetic neuropathy ([place])   . Eosinophil count raised 02/17/2019  . Essential hypertension 01/06/2010   Last Assessment & Plan:  Well controlled Continue med management  . 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  Steroid usage was discontinued several months ago.  Follow up with Dermatology as planned.  . Hypercholesterolemia 01/06/2010   Last Assessment & Plan:  Repeat labs recommended  . Hyperlipidemia associated with type 2 diabetes mellitus ([place]) 01/28/2018  . 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 Hyponatremia 12/17/2018  .  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 Infection of prosthetic right knee joint ([place])   . 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 Lesion of liver 04/20/2019   -On cardiac CT 02/2019  . Mild reactive airways disease   . 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 On amiodarone therapy 09/07/2013  . On continuous oral anticoagulation 01/28/2018  . Other activity(E029.9) 04/20/2013   Formatting of this note might be different from the original. Transthoracic Echocardiogram-[facility]10[facility]: Normal left ventricular wall thickness and cavity size.  Global left ventricular systolic function is moderately reduced.  The estimated ejection fraction is 35-40%.  The left atrium is moderately enlarged.  The  right atrium is mildly enlarged.  No significant valvular   . Overweight (BMI 25.0-29.9) 10/22/2016  . Persistent atrial fibrillation ([place]) 04/16/2013   Last Assessment & Plan:  Rate controlled Continue med management eliquis for stroke preventino  Formatting of this note might be different from the original.  Drug  HX Current Rx Pre-ABL inefficacy Pre-ABL intolerant Post-ABL inefficacy Post-ABL intolerant max dose/24h M/Y end comments  sotalol                  dofetilide                  flecainide                  propafenone                  am  . S/P TKR (total knee replacement), bilateral   . Secondary hypercoagulable state ([place]) 04/09/2019  . Tinea cruris 04/16/2013  . Type 2 diabetes mellitus with diabetic polyneuropathy, without long-term current use of insulin ([place]) 04/16/2013   Last Assessment & Plan:  Labs today Pt reports well  controlled on ambulatory monitoring  . Vitamin D deficiency 07/25/2018    Tobacco History: Social History   Tobacco Use  Smoking Status Never Smoker  Smokeless Tobacco Never Used   Counseling given: Yes   Continue to not smoke  Outpatient Encounter Medications as of 09/07/2019  Medication Sig  . albuterol (VENTOLIN HFA) 108 (90 Base) MCG/ACT inhaler Inhale 2 puffs into the lungs every 6 (six) hours as needed for wheezing or shortness of breath.  Marland Kitchen arformoterol (BROVANA) 15 MCG/2ML NEBU Take 2 mLs (15 mcg total) by nebulization in the morning and at bedtime. CZ:Y60.630  . atorvastatin (LIPITOR) 20 MG tablet TAKE 1 TABLET DAILY AT 6 P.M.  . budesonide (PULMICORT) 1 MG/2ML nebulizer solution   . cephALEXin (KEFLEX) 500 MG capsule Take 1 capsule (500 mg total) by mouth 4 (four) times daily.  Marland Kitchen ELIQUIS 5 MG TABS tablet TAKE 1 TABLET TWICE A DAY  . fluticasone (FLONASE) 50 MCG/ACT nasal spray Place 2 sprays into both nostrils daily.  Marland Kitchen gabapentin (NEURONTIN) 300 MG capsule TAKE 2 CAPSULES AT BEDTIME  . glucose blood ([employer] TEST STRIPS) test strip Test 2x dailyDx: type 2 diabetes, uncontrolledfluctuating blood sugars  . glucose blood test strip Use to test blood sugars once daily as directed Dx:E11.65  . metFORMIN (GLUCOPHAGE) 500 MG tablet TAKE 1 TABLET TWICE A DAY WITH MEALS  . metoprolol tartrate (LOPRESSOR) 50 MG tablet TAKE 2 TABLETS (100 MG TOTAL) BY MOUTH 2 (TWO) TIMES DAILY.  . montelukast (SINGULAIR) 10 MG tablet Take 10 mg by mouth at bedtime.   . potassium chloride SA (KLOR-CON M20) 20 MEQ tablet Take 1 tablet (20 mEq total) by mouth daily.  Marland Kitchen torsemide (DEMADEX) 20 MG tablet Take 20 mg by mouth daily.  . fluticasone furoate-vilanterol (BREO ELLIPTA) 100-25 MCG/INH AEPB Inhale 1 puff into the lungs daily.   No facility-administered encounter medications on file as of 09/07/2019.     Review of Systems  Review of Systems  Constitutional: Positive for fatigue. Negative for  activity change, chills, fever and unexpected weight change.  HENT: Positive for congestion, postnasal drip and rhinorrhea. Negative for sinus pressure, sinus pain and sore throat.   Eyes: Negative.   Respiratory: Negative for cough, shortness of breath and wheezing.   Cardiovascular: Negative for chest pain and palpitations.  Gastrointestinal: Negative for constipation, diarrhea, nausea and vomiting.  Endocrine: Negative.   Genitourinary: Negative.   Musculoskeletal: Negative.   Skin: Negative.   Neurological: Negative for dizziness and headaches.  Psychiatric/Behavioral: Negative.  Negative for dysphoric mood. The patient is not nervous/anxious.   All other systems reviewed and are negative.    Physical Exam  BP 130/70 (BP Location: Left Arm, Cuff Size: Normal)   Pulse 66   Temp (!) 97.3 F (36.3 C) (Oral)   Ht 5\' 11"  (1.803 m)   Wt 188 lb (85.3 kg)   SpO2 99%   BMI 26.22 kg/m   Wt Readings from Last 5 Encounters:  09/07/19 188 lb (85.3 kg)  07/28/19 189 lb 9.6 oz (86 kg)  07/22/19 189 lb (85.7 kg)  07/01/19 189 lb 1.3 oz (85.8 kg)  06/16/19 189 lb 6.4 oz (85.9 kg)    BMI Readings from Last 5 Encounters:  09/07/19 26.22 kg/m  07/28/19 27.20 kg/m  07/22/19 27.12 kg/m  07/01/19 27.13 kg/m  06/16/19 27.18 kg/m     Physical Exam Vitals and nursing note reviewed.  Constitutional:      General: He is not in acute distress.    Appearance: Normal appearance. He is obese.  HENT:     Head: Normocephalic and atraumatic.     Right Ear: Hearing, tympanic membrane, ear canal and external ear normal. There is no impacted cerumen.     Left Ear: Hearing, tympanic membrane, ear canal and external ear normal. There is no impacted cerumen.     Nose: Congestion and rhinorrhea present. No mucosal edema.     Right Turbinates: Not enlarged.     Left Turbinates: Not enlarged.     Mouth/Throat:     Mouth: Mucous membranes are dry.     Pharynx: Oropharynx is clear. No  oropharyngeal exudate.     Comments: Postnasal drip Eyes:     Pupils: Pupils are equal, round, and reactive to light.  Cardiovascular:     Rate and Rhythm: Normal rate and regular rhythm.     Pulses: Normal pulses.     Heart sounds: Normal heart sounds. No murmur heard.   Pulmonary:     Effort: Pulmonary effort is normal.     Breath sounds: No decreased breath sounds, wheezing or rales.  Musculoskeletal:     Cervical back: Normal range of motion.     Right lower leg: No edema.     Left lower leg: No edema.  Lymphadenopathy:     Cervical: No cervical adenopathy.  Skin:    General: Skin is warm and dry.     Capillary Refill: Capillary refill takes less than 2 seconds.     Findings: No erythema or rash.  Neurological:     General: No focal deficit present.     Mental Status: He is alert and oriented to person, place, and time.     Motor: No weakness.     Coordination: Coordination normal.     Gait: Gait is intact. Gait normal.  Psychiatric:        Mood and Affect: Mood normal.        Behavior: Behavior normal. Behavior is cooperative.        Thought Content: Thought content normal.        Judgment: Judgment normal.       Assessment & Plan:   Allergic rhinitis Most recently seen by ear nose and throat Started on Singulair Started on budesonide and nasal saline rinses Patient also given prednisone taper Following up in 2 weeks  Plan: Continue current recommendations Keep follow-up with ear nose and throat Continue Singulair  Mild reactive airways disease Plan: Continue follow-up with ear nose and throat Stop Brovana Trial of Breo Ellipta 100 If symptoms persist can obtain pulmonary function testing, order placed today  Lesion of liver Per patient cardiology did not feel the patient needed repeat imaging  Plan: Continue follow-up with cardiology    Return in about 3 months (around 12/08/2019), or if symptoms worsen or fail to improve, for Follow up with  Dr. Elsworth Soho.   Lauraine Rinne, NP 09/07/2019   This appointment required 32 minutes of patient care (this includes precharting, chart review, review of results, face-to-face care, etc.).

## 2019-09-07 NOTE — Assessment & Plan Note (Signed)
Per patient cardiology did not feel the patient needed repeat imaging  Plan: Continue follow-up with cardiology

## 2019-09-07 NOTE — Assessment & Plan Note (Signed)
Most recently seen by ear nose and throat Started on Singulair Started on budesonide and nasal saline rinses Patient also given prednisone taper Following up in 2 weeks  Plan: Continue current recommendations Keep follow-up with ear nose and throat Continue Singulair

## 2019-09-07 NOTE — Patient Instructions (Addendum)
You were seen today by Lauraine Rinne, NP  for:   1. Mild intermittent reactive airway disease without complication  - Pulmonary function test; Future  Trial of Breo Ellipta 100 >>> Take 1 puff daily in the morning right when you wake up >>>Rinse your mouth out after use >>>This is a daily maintenance inhaler, NOT a rescue inhaler >>>Contact our office if you are having difficulties affording or obtaining this medication >>>It is important for you to be able to take this daily and not miss any doses >>> Start taking on 09/08/2019  Sample provided today, contact our office when you finish the sample and let us know how you are doing on it   Stop Brovana nebulized meds when taking Brio Ellipta   Continue forward with ear nose and throat follow-up.  Continue medications as recommended by ear nose and throat.  Continue Singulair  Continue budesonide/nasal saline rinse as managed by ENT  2. Lesion of liver  Thank you for follow-up with cardiology.  They can continue to clinically monitor you or decide whether or not you need repeat imaging of her liver.  As discussed today.   We recommend today:  Orders Placed This Encounter  Procedures  . Pulmonary function test    Standing Status:   Future    Standing Expiration Date:   09/06/2020    Order Specific Question:   Where should this test be performed?    Answer:   [facility]    Order Specific Question:   Full PFT: includes the following: basic spirometry, spirometry pre & post bronchodilator, diffusion capacity (DLCO), lung volumes    Answer:   Full PFT   Orders Placed This Encounter  Procedures  . Pulmonary function test   No orders of the defined types were placed in this encounter.   Follow Up:    Return in about 3 months (around 12/08/2019), or if symptoms worsen or fail to improve, for Follow up with Dr. Elsworth Soho.   Please do your part to reduce the spread of COVID-19:      Reduce your risk of any infection  and  COVID19 by using the similar precautions used for avoiding the common cold or flu:  Marland Kitchen Wash your hands often with soap and warm water for at least 20 seconds.  If soap and water are not readily available, use an alcohol-based hand sanitizer with at least 60% alcohol.  . If coughing or sneezing, cover your mouth and nose by coughing or sneezing into the elbow areas of your shirt or coat, into a tissue or into your sleeve (not your hands). Langley Gauss A MASK when in public  . Avoid shaking hands with others and consider head nods or verbal greetings only. . Avoid touching your eyes, nose, or mouth with unwashed hands.  . Avoid close contact with people who are sick. . Avoid places or events with large numbers of people in one location, like concerts or sporting events. . If you have some symptoms but not all symptoms, continue to monitor at home and seek medical attention if your symptoms worsen. . If you are having a medical emergency, call 911.   [place] / e-Visit: [email or web address]         [facility]: [facility]: [phone]                   [facility]: [phone]  It is flu season:   >>> Best ways to protect herself from the flu: Receive the yearly flu vaccine, practice good hand hygiene washing with soap and also using hand sanitizer when available, eat a nutritious meals, get adequate rest, hydrate appropriately   Please contact the office if your symptoms worsen or you have concerns that you are not improving.   Thank you for choosing [facility] for your healthcare, and for allowing [place] to partner with you on your healthcare journey. I am thankful to be able to provide care to you today.   Wyn Quaker FNP-C

## 2019-09-07 NOTE — Assessment & Plan Note (Signed)
Plan: Continue follow-up with ear nose and throat Stop Brovana Trial of Breo Ellipta 100 If symptoms persist can obtain pulmonary function testing, order placed today

## 2019-09-14 ENCOUNTER — Telehealth: Payer: Self-pay | Admitting: Pulmonary Disease

## 2019-09-14 MED ORDER — BREO ELLIPTA 100-25 MCG/INH IN AEPB: 1.0000 | INHALATION_SPRAY | Freq: Every day | RESPIRATORY_TRACT | 5 refills | Status: DC

## 2019-09-14 NOTE — Telephone Encounter (Signed)
Spoke with pt, states he is doing well on Breo samples that Aaron Edelman gave him, would like this called in to his pharmacy. rx sent to preferred pharmacy as requested.  Nothing further needed at this time- will close encounter.

## 2019-09-23 ENCOUNTER — Other Ambulatory Visit: Payer: Self-pay | Admitting: Internal Medicine

## 2019-09-23 DIAGNOSIS — E1142 Type 2 diabetes mellitus with diabetic polyneuropathy: Secondary | ICD-10-CM

## 2019-09-24 ENCOUNTER — Ambulatory Visit: Payer: Medicare Other | Admitting: Cardiology

## 2019-09-24 ENCOUNTER — Other Ambulatory Visit: Payer: Self-pay

## 2019-09-24 ENCOUNTER — Encounter: Payer: Self-pay | Admitting: Cardiology

## 2019-09-24 VITALS — BP 126/78 | HR 61 | Ht 71.0 in | Wt 188.0 lb

## 2019-09-24 DIAGNOSIS — E1142 Type 2 diabetes mellitus with diabetic polyneuropathy: Secondary | ICD-10-CM

## 2019-09-24 DIAGNOSIS — Z7901 Long term (current) use of anticoagulants: Secondary | ICD-10-CM | POA: Diagnosis not present

## 2019-09-24 DIAGNOSIS — I480 Paroxysmal atrial fibrillation: Secondary | ICD-10-CM | POA: Diagnosis not present

## 2019-09-24 DIAGNOSIS — I5042 Chronic combined systolic (congestive) and diastolic (congestive) heart failure: Secondary | ICD-10-CM | POA: Diagnosis not present

## 2019-09-24 NOTE — Progress Notes (Signed)
Electrophysiology Office Note   Date:  09/24/2019   ID:  [name], DOB 1940-11-04, MRN [id number]  PCP:  Isaac Bliss, [name], MD  Cardiologist:  Bettina Gavia Primary Electrophysiologist:  Leopoldo Mazzie Meredith Leeds, MD    Chief Complaint: atrial fibrillation initial   History of Present Illness: Charles Lloyd. is a 79 y.o. male who is being seen today for the evaluation of atrial fibrillation at the request of [name] Presenting today for electrophysiology evaluation.  He has a history of atrial fibrillation, moderate systolic heart failure, diabetes, hypertension, hyperlipidemia.  He had an ablation in [place] in 2015 with a cardioversion in 2017 and then a repeat cardioversion September 2019.  He is currently on Eliquis and rate controlled with diltiazem and metoprolol.  He has been having significant nighttime shortness of breath where he has to sit on the edge of the bed.  His diuretics have been increased.  He had an attempt at cardioversion 10/10/2018 but unfortunately failed to convert.  He is now status post AF ablation 03/12/2019.  Today, denies symptoms of palpitations, chest pain, shortness of breath, orthopnea, PND, lower extremity edema, claudication, dizziness, presyncope, syncope, bleeding, or neurologic sequela. The patient is tolerating medications without difficulties. Since his last visit, he has continued to feel well. He has no chest pain or shortness of breath, and is able to do all of his daily activities without restriction. His tracheobronchitis has been improved on the inhalers that he is using. Otherwise he has no complaints.   Past Medical History:  Diagnosis Date   Acute tracheobronchitis 01/05/2019   B12 deficiency    Basal cell carcinoma (BCC) of left side of nose 01/28/2018   Cancer ([place])    Cardiomyopathy ([place]) 03/10/2018   With chronic atrial fibrillation   CHF (congestive heart failure) ([place]) 04/16/2013   Functional class II, ejection  fraction 35-40%  Formatting of this note might be different from the original. Functional class II, ejection fraction 35-40%   Chronic anticoagulation    Chronic diastolic (congestive) heart failure ([place]) 04/16/2013   Functional class II, ejection fraction 35-40%  Last Assessment & Plan:  Clinically stable Volume well controlled meds reviewed   Chronic diastolic CHF (congestive heart failure) ([place]) 04/16/2013   Functional class II, ejection fraction 35-40%  Last Assessment & Plan:  Clinically stable Volume well controlled meds reviewed   Colon polyps 04/16/2013   Diabetic neuropathy (HCC)    Eosinophil count raised 02/17/2019   Essential hypertension 01/06/2010   Last Assessment & Plan:  Well controlled Continue med management   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  Steroid usage was discontinued several months ago.  Follow up with Dermatology as planned.   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   Last Assessment & Plan:  Id following ?ongoing abx managementy reported Request records   Infection of prosthetic right knee joint ([place])    Insomnia 03/04/2012   Grief with loss of wife 02/20/12  Last Assessment & Plan:  Improved sx  contineu xanax prn Se discussed   Lesion of liver 04/20/2019   -On cardiac CT 02/2019   Mild  reactive airways disease    Neoplasm of prostate 04/16/2013   Neoplasm of prostate, 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 Transthoracic Echocardiogram-03/10/2013-[facility]: Normal left ventricular wall thickness and cavity size.  Global left ventricular systolic function is moderately reduced.  The estimated ejection fraction is 35-40%.  The left atrium is moderately enlarged.  The right atrium is mildly enlarged.  No significant valvular    Overweight (BMI 25.0-29.9) 10/22/2016   Persistent atrial fibrillation ([place]) 04/16/2013   Last Assessment & Plan:  Rate controlled Continue med management eliquis for stroke preventino  Formatting of this note might be different from the original.  Drug  HX Current Rx Pre-ABL inefficacy Pre-ABL intolerant Post-ABL inefficacy Post-ABL intolerant max dose/24h M/Y end comments  sotalol                  dofetilide                  flecainide                  propafenone                  am   S/P TKR (total knee replacement), bilateral    Secondary hypercoagulable state ([place]) 04/09/2019   Tinea cruris 04/16/2013   Type 2 diabetes mellitus with diabetic polyneuropathy, without long-term current use of insulin ([place]) 04/16/2013   Last Assessment & Plan:  Labs today Pt reports well controlled on ambulatory monitoring   Vitamin D deficiency 07/25/2018   Past Surgical History:  Procedure Laterality Date   ATRIAL FIBRILLATION ABLATION N/A 03/12/2019   Procedure: ATRIAL FIBRILLATION ABLATION;  Surgeon: [name], MD;  Location: [facility];  Service: Cardiovascular;  Laterality: N/A;   CARDIAC ELECTROPHYSIOLOGY STUDY AND ABLATION     CARDIOVERSION     CARDIOVERSION N/A 10/10/2018   Procedure: CARDIOVERSION;  Surgeon: [name], MD;  Location: [facility];  Service: Cardiovascular;  Laterality: N/A;   CHOLECYSTECTOMY     PROSTATECTOMY     REPLACEMENT TOTAL KNEE BILATERAL       Current Outpatient  Medications  Medication Sig Dispense Refill   albuterol (VENTOLIN HFA) 108 (90 Base) MCG/ACT inhaler Inhale 2 puffs into the lungs every 6 (six) hours as needed for wheezing or shortness of breath. 8 g 2   arformoterol (BROVANA) 15 MCG/2ML NEBU Take 2 mLs (15 mcg total) by nebulization in the morning and at bedtime. DX:J45.909 120 mL 1   atorvastatin (LIPITOR) 20 MG tablet TAKE 1 TABLET DAILY AT 6 P.M. 90 tablet 1   budesonide (PULMICORT) 1 MG/2ML nebulizer solution      ELIQUIS 5 MG TABS tablet TAKE 1 TABLET TWICE A DAY 180 tablet 3   fluticasone (FLONASE) 50 MCG/ACT nasal spray Place 2 sprays into both nostrils daily. 16 g 2   fluticasone furoate-vilanterol (BREO ELLIPTA) 100-25 MCG/INH AEPB Inhale 1 puff into the lungs daily. 60 each 5   gabapentin (NEURONTIN) 300 MG capsule TAKE 2 CAPSULES AT BEDTIME 180 capsule 1   glucose blood ([employer] TEST STRIPS) test strip Test 2x dailyDx: type 2 diabetes, uncontrolledfluctuating blood sugars 100 each 0   glucose blood  test strip Use to test blood sugars once daily as directed Dx:E11.65     metFORMIN (GLUCOPHAGE) 500 MG tablet TAKE 1 TABLET TWICE A DAY WITH MEALS 180 tablet 1   metoprolol tartrate (LOPRESSOR) 50 MG tablet TAKE 2 TABLETS (100 MG TOTAL) BY MOUTH 2 (TWO) TIMES DAILY. 360 tablet 1   montelukast (SINGULAIR) 10 MG tablet Take 10 mg by mouth at bedtime.      potassium chloride SA (KLOR-CON M20) 20 MEQ tablet Take 1 tablet (20 mEq total) by mouth daily. 90 tablet 3   torsemide (DEMADEX) 20 MG tablet Take 20 mg by mouth daily.     No current facility-administered medications for this visit.    Allergies:   Patient has no known allergies.   Social History:  The patient  reports that he has never smoked. He has never used smokeless tobacco. He reports current alcohol use of about 2.0 standard drinks of alcohol per week. He reports that he does not use drugs.   Family History:  The patient's family history includes Cancer in his  father and mother; Depression in his father and mother; Early death in his father and mother.    ROS:  Please see the history of present illness.   Otherwise, review of systems is positive for none.   All other systems are reviewed and negative.   PHYSICAL EXAM: VS:  BP 126/78    Pulse 61    Ht 5\' 11"  (1.803 m)    Wt 188 lb (85.3 kg)    SpO2 97%    BMI 26.22 kg/m  , BMI Body mass index is 26.22 kg/m. GEN: Well nourished, well developed, in no acute distress  HEENT: normal  Neck: no JVD, carotid bruits, or masses Cardiac: RRR; no murmurs, rubs, or gallops,no edema  Respiratory:  clear to auscultation bilaterally, normal work of breathing GI: soft, nontender, nondistended, + BS MS: no deformity or atrophy  Skin: warm and dry Neuro:  Strength and sensation are intact Psych: euthymic mood, full affect  EKG:  EKG is ordered today. Personal review of the ekg ordered  shows sinus rhythm, rate 61   Recent Labs: 11/06/2018: NT-Pro BNP 1,062 01/05/2019: B Natriuretic Peptide 590.7 01/06/2019: ALT 21; Magnesium 1.8 03/09/2019: Hemoglobin 11.7; Platelets 230 07/16/2019: BUN 20; Creatinine, Ser 1.43; Potassium 4.3; Sodium 144    Lipid Panel     Component Value Date/Time   CHOL 135 04/21/2019 0940   TRIG 81 04/21/2019 0940   HDL 41 04/21/2019 0940   CHOLHDL 3.3 04/21/2019 0940   CHOLHDL 3 07/24/2018 1058   VLDL 25.0 07/24/2018 1058   LDLCALC 78 04/21/2019 0940     Wt Readings from Last 3 Encounters:  09/24/19 188 lb (85.3 kg)  09/07/19 188 lb (85.3 kg)  07/28/19 189 lb 9.6 oz (86 kg)      Other studies Reviewed: Additional studies/ records that were reviewed today include: TTE 10/07/18  Review of the above records today demonstrates:   1. The left ventricle has low normal systolic function, with an ejection fraction of 50-55%. The cavity size was normal. Left ventricular diastolic function could not be evaluated secondary to atrial fibrillation. No evidence of left ventricular   regional wall motion abnormalities.  2. The right ventricle has normal systolic function. The cavity was normal. There is no increase in right ventricular wall thickness. Right ventricular systolic pressure is normal.  3. There is mild mitral annular calcification present.  4. The aortic valve is tricuspid. Moderate  sclerosis of the aortic valve. Aortic valve regurgitation was not assessed by color flow Doppler.  5. The aorta is normal in size and structure.  6. There is right bowing of the interatrial septum, suggestive of elevated left atrial pressure.  Myoview 05/25/19  Nuclear stress EF: 60%.  No T wave inversion was noted during stress.  There was no ST segment deviation noted during stress.  This is a low risk study.   Normal perfusion. LVEF 60% with normal wall motion. This is a low risk study. No prior for comparison.  ASSESSMENT AND PLAN:  1.  Persistent atrial fibrillation: Currently on Eliquis. Status post AF ablation 03/12/2019. CHA2DS2-VASc of 4. He remains in sinus rhythm without episodes of atrial fibrillation. No changes.   2.  Chronic systolic heart failure: Ejection fraction is improved to normal. No obvious volume overload.   3.  Hypertension: Currently well controlled  4.  Hyperlipidemia: Continue statin per primary cardiology    Current medicines are reviewed at length with the patient today.   The patient does not have concerns regarding his medicines.  The following changes were made today: None  Labs/ tests ordered today include:  Orders Placed This Encounter  Procedures   EKG 12-Lead    Disposition:   FU with Johnthomas Lader 6 months  Signed, Laiah Pouncey Meredith Leeds, MD  09/24/2019 10:30 AM     [address] [place] [address] [phone] (office) [phone] (fax)

## 2019-09-24 NOTE — Patient Instructions (Signed)
Medication Instructions:  Your physician recommends that you continue on your current medications as directed. Please refer to the Current Medication list given to you today.  *If you need a refill on your cardiac medications before your next appointment, please call your pharmacy*   Lab Work: None Ordered If you have labs (blood work) drawn today and your tests are completely normal, you will receive your results only by: Marland Kitchen MyChart Message (if you have MyChart) OR . A paper copy in the mail If you have any lab test that is abnormal or we need to change your treatment, we will call you to review the results.   Testing/Procedures: None Ordered   Follow-Up: At [facility], you and your health needs are our priority.  As part of our continuing mission to provide you with exceptional heart care, we have created designated Provider Care Teams.  These Care Teams include your primary Cardiologist (physician) and Advanced Practice Providers (APPs -  Physician Assistants and Nurse Practitioners) who all work together to provide you with the care you need, when you need it.  We recommend signing up for the patient portal called "MyChart".  Sign up information is provided on this After Visit Summary.  MyChart is used to connect with patients for Virtual Visits (Telemedicine).  Patients are able to view lab/test results, encounter notes, upcoming appointments, etc.  Non-urgent messages can be sent to your provider as well.   To learn more about what you can do with MyChart, go to [email or web address].    Your next appointment:   6 month(s)  The format for your next appointment:   In Person  Provider:   Dr. Curt Bears

## 2019-09-30 ENCOUNTER — Other Ambulatory Visit: Payer: Self-pay

## 2019-09-30 ENCOUNTER — Encounter: Payer: Self-pay | Admitting: Adult Health

## 2019-09-30 ENCOUNTER — Ambulatory Visit: Payer: Medicare Other | Admitting: Adult Health

## 2019-09-30 VITALS — BP 160/80 | Temp 98.4°F | Wt 188.0 lb

## 2019-09-30 DIAGNOSIS — R197 Diarrhea, unspecified: Secondary | ICD-10-CM | POA: Diagnosis not present

## 2019-09-30 NOTE — Progress Notes (Signed)
Subjective:    Patient ID: Charles Gum., male    DOB: May 10, 1940, 79 y.o.   MRN: [id number]  HPI 79 year old male who  has a past medical history of Acute tracheobronchitis (01/05/2019), B12 deficiency, Basal cell carcinoma (BCC) of left side of nose (01/28/2018), Cancer ([place]), Cardiomyopathy ([place]) (03/10/2018), CHF (congestive heart failure) ([place]) (04/16/2013), Chronic anticoagulation, Chronic diastolic (congestive) heart failure (HCC) (04/16/2013), Chronic diastolic CHF (congestive heart failure) ([place]) (04/16/2013), Colon polyps (04/16/2013), Diabetic neuropathy ([place]), Eosinophil count raised (02/17/2019), Essential hypertension (01/06/2010), 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), Hypercholesterolemia (01/06/2010), Hyperlipidemia associated with type 2 diabetes mellitus ([place]) (01/28/2018), Hypertensive heart disease with heart failure ([place]) (01/06/2010), Hyponatremia (12/17/2018), 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), Infection of prosthetic right knee joint ([place]), Insomnia (03/04/2012), Lesion of liver (04/20/2019), Mild reactive airways disease, Neoplasm of prostate (04/16/2013), Neoplasm of prostate, malignant ([place]) (01/06/2010), On amiodarone therapy (09/07/2013), On continuous oral anticoagulation (01/28/2018), Other activity(E029.9) (04/20/2013), Overweight (BMI 25.0-29.9) (10/22/2016), Persistent atrial fibrillation ([place]) (04/16/2013), S/P TKR (total knee replacement), bilateral, Secondary hypercoagulable state ([place]) (04/09/2019), Tinea cruris (04/16/2013), Type 2 diabetes mellitus with diabetic polyneuropathy, without long-term current use of insulin ([place]) (04/16/2013), and Vitamin D deficiency (07/25/2018).  He presents to the office today for acute issue of postprandial diarrhea.  He reports that over the last week he will have a "normal" bowel movement in the morning before breakfast, but once he starts eating he will have multiple episodes of diarrhea throughout the day.  This is been present every day  for the last week.  He denies eating fatty foods but does drink coffee in the morning.  He denies fevers, chills, nausea, vomiting,bloating, abdominal pain, blood in his stool, or feeling acutely ill  He has not been on a constant antibiotics but will take a course of amoxicillin prior to dental work.  No recent travel or suspicious foods.  He has no noticed any abnormal color or odor to stool   Review of Systems See HPI   Past Medical History:  Diagnosis Date  . Acute tracheobronchitis 01/05/2019  . B12 deficiency   . 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 Cancer ([place])   . Cardiomyopathy ([place]) 03/10/2018   With chronic atrial fibrillation  . CHF (congestive heart failure) ([place]) 04/16/2013   Functional class II, ejection fraction 35-40%  Formatting of this note might be different from the original. Functional class II, ejection fraction 35-40%  . Chronic anticoagulation   . Chronic diastolic (congestive) heart failure (HCC) 04/16/2013   Functional class II, ejection fraction 35-40%  Last Assessment & Plan:  Clinically stable Volume well controlled meds reviewed  . Chronic diastolic CHF (congestive heart failure) ([place]) 04/16/2013   Functional class II, ejection fraction 35-40%  Last Assessment & Plan:  Clinically stable Volume well controlled meds reviewed  . Colon polyps 04/16/2013  . Diabetic neuropathy ([place])   . Eosinophil count raised 02/17/2019  . Essential hypertension 01/06/2010   Last Assessment & Plan:  Well controlled Continue med management  . 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  Steroid usage was discontinued several months ago.  Follow up with Dermatology as planned.  . Hypercholesterolemia 01/06/2010   Last Assessment & Plan:  Repeat labs recommended  . Hyperlipidemia associated with type 2 diabetes mellitus ([place]) 01/28/2018  . Hypertensive heart disease with heart failure ([place]) 01/06/2010    Last Assessment & Plan:  Well controlled Continue med  management  . Hyponatremia 12/17/2018  . Infected prosthetic knee joint ([place]) 06/24/2017   Last Assessment & Plan:  Id following ?ongoing abx managementy reported Request records  . Infection of prosthetic right knee joint ([place])   . 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 Lesion of liver 04/20/2019   -On cardiac CT 02/2019  . Mild reactive airways disease   . 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 On amiodarone therapy 09/07/2013  . On continuous oral anticoagulation 01/28/2018  . Other activity(E029.9) 04/20/2013   Formatting of this note might be different from the original. Transthoracic Echocardiogram-[facility]10/[facility]: Normal left ventricular wall thickness and cavity size.  Global left ventricular systolic function is moderately reduced.  The estimated ejection fraction is 35-40%.  The left atrium is moderately enlarged.  The right atrium is mildly enlarged.  No significant valvular   . Overweight (BMI 25.0-29.9) 10/22/2016  . Persistent atrial fibrillation ([place]) 04/16/2013   Last Assessment & Plan:  Rate controlled Continue med management eliquis for stroke preventino  Formatting of this note might be different from the original.  Drug  HX Current Rx Pre-ABL inefficacy Pre-ABL intolerant Post-ABL inefficacy Post-ABL intolerant max dose/24h M/Y end comments  sotalol                  dofetilide                  flecainide                  propafenone                  am  . S/P TKR (total knee replacement), bilateral   . Secondary hypercoagulable state ([place]) 04/09/2019  . Tinea cruris 04/16/2013  . Type 2 diabetes mellitus with diabetic polyneuropathy, without long-term  current use of insulin ([place]) 04/16/2013   Last Assessment & Plan:  Labs today Pt reports well controlled on ambulatory monitoring  . Vitamin D deficiency 07/25/2018    Social History   Socioeconomic History  . Marital status: Widowed    Spouse name: Not on file  . Number of children: Not on file  . Years of education: Not on file  . Highest education level: Not on file  Occupational History  . Not on file  Tobacco Use  . Smoking status: Never Smoker  . Smokeless tobacco: Never Used  Vaping Use  . Vaping Use: Never used  Substance and Sexual Activity  . Alcohol use: Yes    Alcohol/week: 2.0 standard drinks    Types: 2 Glasses of wine per week    Comment: daily  . Drug use: Never  . Sexual activity: Not Currently  Other Topics Concern  . Not on file  Social History Narrative  . Not on file   Social Determinants of Health   Financial Resource Strain:   . Difficulty of Paying Living Expenses:   Food Insecurity:   . Worried About Charity fundraiser in the Last Year:   . Arboriculturist in the Last Year:   Transportation Needs:   . Film/video editor (Medical):   Marland Kitchen Lack of Transportation (Non-Medical):   Physical Activity:   .  Days of Exercise per Week:   . Minutes of Exercise per Session:   Stress:   . Feeling of Stress :   Social Connections:   . Frequency of Communication with Friends and Family:   . Frequency of Social Gatherings with Friends and Family:   . Attends Religious Services:   . Active Member of Clubs or Organizations:   . Attends Archivist Meetings:   Marland Kitchen Marital Status:   Intimate Partner Violence:   . Fear of Current or Ex-Partner:   . Emotionally Abused:   Marland Kitchen Physically Abused:   . Sexually Abused:     Past Surgical History:  Procedure Laterality Date  . ATRIAL FIBRILLATION ABLATION N/A 03/12/2019   Procedure: ATRIAL FIBRILLATION ABLATION;  Surgeon: [name], MD;  Location: [facility];  Service: Cardiovascular;   Laterality: N/A;  . CARDIAC ELECTROPHYSIOLOGY STUDY AND ABLATION    . CARDIOVERSION    . CARDIOVERSION N/A 10/10/2018   Procedure: CARDIOVERSION;  Surgeon: [name], MD;  Location[facility] ENDOSCOPY;  Service: Cardiovascular;  Laterality: N/A;  . CHOLECYSTECTOMY    . PROSTATECTOMY    . REPLACEMENT TOTAL KNEE BILATERAL      Family History  Problem Relation Age of Onset  . Cancer Mother   . Depression Mother   . Early death Mother   . Cancer Father   . Depression Father   . Early death Father     No Known Allergies  Current Outpatient Medications on File Prior to Visit  Medication Sig Dispense Refill  . albuterol (VENTOLIN HFA) 108 (90 Base) MCG/ACT inhaler Inhale 2 puffs into the lungs every 6 (six) hours as needed for wheezing or shortness of breath. 8 g 2  . atorvastatin (LIPITOR) 20 MG tablet TAKE 1 TABLET DAILY AT 6 P.M. 90 tablet 1  . budesonide (PULMICORT) 1 MG/2ML nebulizer solution     . ELIQUIS 5 MG TABS tablet TAKE 1 TABLET TWICE A DAY 180 tablet 3  . fluticasone (FLONASE) 50 MCG/ACT nasal spray Place 2 sprays into both nostrils daily. 16 g 2  . fluticasone furoate-vilanterol (BREO ELLIPTA) 100-25 MCG/INH AEPB Inhale 1 puff into the lungs daily. 60 each 5  . gabapentin (NEURONTIN) 300 MG capsule TAKE 2 CAPSULES AT BEDTIME 180 capsule 1  . glucose blood ([employer] TEST STRIPS) test strip Test 2x dailyDx: type 2 diabetes, uncontrolledfluctuating blood sugars 100 each 0  . glucose blood test strip Use to test blood sugars once daily as directed Dx:E11.65    . metFORMIN (GLUCOPHAGE) 500 MG tablet TAKE 1 TABLET TWICE A DAY WITH MEALS 180 tablet 1  . metoprolol tartrate (LOPRESSOR) 50 MG tablet TAKE 2 TABLETS (100 MG TOTAL) BY MOUTH 2 (TWO) TIMES DAILY. 360 tablet 1  . montelukast (SINGULAIR) 10 MG tablet Take 10 mg by mouth at bedtime.     . potassium chloride SA (KLOR-CON M20) 20 MEQ tablet Take 1 tablet (20 mEq total) by mouth daily. 90 tablet 3  . torsemide (DEMADEX) 20 MG  tablet Take 20 mg by mouth daily.     No current facility-administered medications on file prior to visit.    BP (!) 160/80   Temp 98.4 F (36.9 C)   Wt 188 lb (85.3 kg)   BMI 26.22 kg/m       Objective:   Physical Exam Vitals and nursing note reviewed.  Constitutional:      Appearance: Normal appearance.  Cardiovascular:     Rate and Rhythm:  Normal rate and regular rhythm.     Pulses: Normal pulses.     Heart sounds: Normal heart sounds.  Pulmonary:     Effort: Pulmonary effort is normal.     Breath sounds: Normal breath sounds.  Abdominal:     General: Abdomen is flat. Bowel sounds are normal. There is no distension.     Palpations: Abdomen is soft. There is no mass.     Tenderness: There is no abdominal tenderness. There is no guarding or rebound.     Hernia: No hernia is present.  Skin:    General: Skin is warm and dry.     Capillary Refill: Capillary refill takes less than 2 seconds.  Neurological:     General: No focal deficit present.     Mental Status: He is alert and oriented to person, place, and time.  Psychiatric:        Mood and Affect: Mood normal.        Behavior: Behavior normal.        Thought Content: Thought content normal.        Judgment: Judgment normal.       Assessment & Plan:  1. Diarrhea, unspecified type -Does not appear as bacterial infection, food poisoning, diverticulitis, GERD.  Possible IBS.  Advised to cut out coffee, bland diet for the next 72 hours.  He can take a half a tab of Imodium every 6 hours over the next 24 hours.  If symptoms do not improve or resolve then follow-up with myself or his PCP.  Check CBC and BMP for electrolyte abnormality and infection - CBC with Differential/Platelet; Future - Basic Metabolic Panel; Future  Dorothyann Peng, NP

## 2019-09-30 NOTE — Patient Instructions (Addendum)
It was great meeting you today   I do not think you have any serious going on.   I would take a half tab of imodium tonight and tomorrow morning.   Refrain from coffee for the next few days   Eat a BRAT diet   Please follow up if symptoms do not resolve in the next few days    Bland Diet A bland diet consists of foods that are often soft and do not have a lot of fat, fiber, or extra seasonings. Foods without fat, fiber, or seasoning are easier for the body to digest. They are also less likely to irritate your mouth, throat, stomach, and other parts of your digestive system. A bland diet is sometimes called a BRAT diet. What is my plan? Your health care provider or food and nutrition specialist (dietitian) may recommend specific changes to your diet to prevent symptoms or to treat your symptoms. These changes may include:  Eating small meals often.  Cooking food until it is soft enough to chew easily.  Chewing your food well.  Drinking fluids slowly.  Not eating foods that are very spicy, sour, or fatty.  Not eating citrus fruits, such as oranges and grapefruit. What do I need to know about this diet?  Eat a variety of foods from the bland diet food list.  Do not follow a bland diet longer than needed.  Ask your health care provider whether you should take vitamins or supplements. What foods can I eat? Grains  Hot cereals, such as cream of wheat. Rice. Bread, crackers, or tortillas made from refined white flour. Vegetables Canned or cooked vegetables. Mashed or boiled potatoes. Fruits  Bananas. Applesauce. Other types of cooked or canned fruit with the skin and seeds removed, such as canned peaches or pears. Meats and other proteins  Scrambled eggs. Creamy peanut butter or other nut butters. Lean, well-cooked meats, such as chicken or fish. Tofu. Soups or broths. Dairy Low-fat dairy products, such as milk, cottage cheese, or yogurt. Beverages  Water. Herbal tea.  Apple juice. Fats and oils Mild salad dressings. Canola or olive oil. Sweets and desserts Pudding. Custard. Fruit gelatin. Ice cream. The items listed above may not be a complete list of recommended foods and beverages. Contact a dietitian for more options. What foods are not recommended? Grains Whole grain breads and cereals. Vegetables Raw vegetables. Fruits Raw fruits, especially citrus, berries, or dried fruits. Dairy Whole fat dairy foods. Beverages Caffeinated drinks. Alcohol. Seasonings and condiments Strongly flavored seasonings or condiments. Hot sauce. Salsa. Other foods Spicy foods. Fried foods. Sour foods, such as pickled or fermented foods. Foods with high sugar content. Foods high in fiber. The items listed above may not be a complete list of foods and beverages to avoid. Contact a dietitian for more information. Summary  A bland diet consists of foods that are often soft and do not have a lot of fat, fiber, or extra seasonings.  Foods without fat, fiber, or seasoning are easier for the body to digest.  Check with your health care provider to see how long you should follow this diet plan. It is not meant to be followed for long periods. This information is not intended to replace advice given to you by your health care provider. Make sure you discuss any questions you have with your health care provider. Document Revised: 03/13/2017 Document Reviewed: 03/13/2017 Elsevier Patient Education  2020 [employer].

## 2019-10-01 LAB — CBC WITH DIFFERENTIAL/PLATELET
Absolute Monocytes: 627 cells/uL (ref 200–950)
Basophils Absolute: 28 cells/uL (ref 0–200)
Basophils Relative: 0.5 %
Eosinophils Absolute: 482 cells/uL (ref 15–500)
Eosinophils Relative: 8.6 %
HCT: 36.3 % — ABNORMAL LOW (ref 38.5–50.0)
Hemoglobin: 11.8 g/dL — ABNORMAL LOW (ref 13.2–17.1)
Lymphs Abs: 1383 cells/uL (ref 850–3900)
MCH: 29.7 pg (ref 27.0–33.0)
MCHC: 32.5 g/dL (ref 32.0–36.0)
MCV: 91.4 fL (ref 80.0–100.0)
MPV: 11.7 fL (ref 7.5–12.5)
Monocytes Relative: 11.2 %
Neutro Abs: 3080 cells/uL (ref 1500–7800)
Neutrophils Relative %: 55 %
Platelets: 206 10*3/uL (ref 140–400)
RBC: 3.97 10*6/uL — ABNORMAL LOW (ref 4.20–5.80)
RDW: 13.7 % (ref 11.0–15.0)
Total Lymphocyte: 24.7 %
WBC: 5.6 10*3/uL (ref 3.8–10.8)

## 2019-10-01 LAB — BASIC METABOLIC PANEL
BUN/Creatinine Ratio: 17 (calc) (ref 6–22)
BUN: 25 mg/dL (ref 7–25)
CO2: 27 mmol/L (ref 20–32)
Calcium: 9.4 mg/dL (ref 8.6–10.3)
Chloride: 106 mmol/L (ref 98–110)
Creat: 1.45 mg/dL — ABNORMAL HIGH (ref 0.70–1.18)
Glucose, Bld: 151 mg/dL — ABNORMAL HIGH (ref 65–99)
Potassium: 4 mmol/L (ref 3.5–5.3)
Sodium: 143 mmol/L (ref 135–146)

## 2019-10-08 ENCOUNTER — Telehealth: Payer: Self-pay | Admitting: Cardiology

## 2019-10-08 NOTE — Telephone Encounter (Signed)
Pt aware may take ES tylenol as needed for pain and if finds this does not help to contact PCP for recommendations Pt verbalizes understanding ./cy

## 2019-10-08 NOTE — Telephone Encounter (Signed)
Pt c/o medication issue:  1. Name of Medication: ibuprofen   2. How are you currently taking this medication (dosage and times per day)? As needed  3. Are you having a reaction (difficulty breathing--STAT)?   4. What is your medication issue? Patient states he has been taking ibuprofen for his aches and pains, but his son told him he should not because he had an ablation. He states he also cannot take aspirin, because he is on blood thinners. He would like to know what he can take.

## 2019-10-19 ENCOUNTER — Ambulatory Visit: Payer: Medicare Other | Admitting: Pulmonary Disease

## 2019-11-20 ENCOUNTER — Ambulatory Visit: Payer: Medicare Other | Admitting: Pulmonary Disease

## 2019-11-20 ENCOUNTER — Encounter: Payer: Self-pay | Admitting: Pulmonary Disease

## 2019-11-20 ENCOUNTER — Other Ambulatory Visit: Payer: Self-pay

## 2019-11-20 DIAGNOSIS — M25532 Pain in left wrist: Secondary | ICD-10-CM | POA: Diagnosis not present

## 2019-11-20 DIAGNOSIS — M25531 Pain in right wrist: Secondary | ICD-10-CM | POA: Diagnosis not present

## 2019-11-20 DIAGNOSIS — J4530 Mild persistent asthma, uncomplicated: Secondary | ICD-10-CM

## 2019-11-20 DIAGNOSIS — M2550 Pain in unspecified joint: Secondary | ICD-10-CM | POA: Insufficient documentation

## 2019-11-20 NOTE — Progress Notes (Signed)
   Subjective:    Patient ID: Charles Lloyd., male    DOB: 08-Jun-1940, 79 y.o.   MRN: [id number]  HPI  79 year old never smoker for FU of recurrent dyspnea, cough and wheezing He was admitted twice in October and November and both times improved with steroids. 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 Follow-up    2 mo f/u after switching to [facility] States his breathing has been doing well since last visit.    He improved significantly on Pulmicort and Brovana, on his office visit 03/2019 we stopped these medications. Symptoms/wheezing recurred and Breo were started after office visit 08/2019.  He has seen ENT in the past.  He has seen allergist and [place]  Memory Dance has certainly helped improve his wheezing and shortness of breath. He wonders why symptoms are recurrent. He reports joint pains for the past 6 to 12 months especially in the mornings.  He takes 2 ibuprofen's on the seems to help.  He is also on Eliquis    Significant tests/ events reviewed  CT cardiac 02/2019 -clear lungs, low-attenuation lesions in the liver  Echo 09/2018 showed normal EF Absolute eosinophil count 1700 on 11/9, 1800 on10/21, 400 on5/28/20  Review of Systems neg for any significant sore throat, dysphagia, itching, sneezing, nasal congestion or excess/ purulent secretions, fever, chills, sweats, unintended wt loss, pleuritic or exertional cp, hempoptysis, orthopnea pnd or change in chronic leg swelling. Also denies presyncope, palpitations, heartburn, abdominal pain, nausea, vomiting, diarrhea or change in bowel or urinary  habits, dysuria,hematuria, rash, arthralgias, visual complaints, headache, numbness weakness or ataxia.     Objective:   Physical Exam  Gen. Pleasant, elderly,well-nourished, in no distress ENT - no thrush, no pallor/icterus,no post nasal drip Neck: No JVD, no thyromegaly, no carotid bruits Lungs: no use of accessory muscles, no dullness to percussion, clear without rales or rhonchi  Cardiovascular: Rhythm regular, heart sounds  normal, no murmurs or gallops, no peripheral edema Musculoskeletal: No deformities, no cyanosis or clubbing        Assessment & Plan:

## 2019-11-20 NOTE — Addendum Note (Signed)
Addended by: Suzzanne Cloud E on: 11/20/2019 12:25 PM   Modules accepted: Orders

## 2019-11-20 NOTE — Assessment & Plan Note (Signed)
Initially felt to be cardiac trigger but recurred in spite of atrial fibrillation being under control.  He has a long history of allergies and used to see an allergist and get allergy shots. We will check RAST for completion. No evidence of GERD. Stay on Breo for now and he will stop in December and see if symptoms recur

## 2019-11-20 NOTE — Assessment & Plan Note (Signed)
He is concerned about rheumatoid arthritis. We will check RA factor, CCP, ANA for completion. He is taking 2 tablets of ibuprofen daily, advised him to discuss with PCP perhaps try Cox 2, Tylenol does not seem to work

## 2019-11-20 NOTE — Patient Instructions (Signed)
Stay on Breo once daily until in November, then okay to stop and see if symptoms recur  Blood work today to check for rheumatoid arthritis and allergies

## 2019-11-23 LAB — RESPIRATORY ALLERGY PROFILE REGION II ~~LOC~~
Allergen, A. alternata, m6: <0.1 kU/L
Allergen, Cedar tree, t12: <0.1 kU/L
Allergen, Comm Silver Birch, t9: 0.48 kU/L — ABNORMAL HIGH
Allergen, Cottonwood, t14: <0.1 kU/L
Allergen, D pternoyssinus,d7: 0.33 kU/L — ABNORMAL HIGH
Allergen, Mouse Urine Protein, e78: 0.5 kU/L — ABNORMAL HIGH
Allergen, Mulberry, t76: <0.1 kU/L
Allergen, Oak,t7: 0.28 kU/L — ABNORMAL HIGH
Allergen, P. notatum, m1: <0.1 kU/L
Aspergillus fumigatus, m3: <0.1 kU/L
Bermuda Grass: 0.19 kU/L — ABNORMAL HIGH
Box Elder IgE: <0.1 kU/L
CLADOSPORIUM HERBARUM (M2) IGE: <0.1 kU/L
COMMON RAGWEED (SHORT) (W1) IGE: 0.21 kU/L — ABNORMAL HIGH
Cat Dander: 0.65 kU/L — ABNORMAL HIGH
Class: 0
Class: 0
Class: 0
Class: 0
Class: 0
Class: 0
Class: 0
Class: 0
Class: 0
Class: 0
Class: 0
Class: 0
Class: 0
Class: 1
Class: 1
Class: 1
Class: 1
Class: 1
Cockroach: <0.1 kU/L
D. farinae: 0.34 kU/L — ABNORMAL HIGH
Dog Dander: 0.28 kU/L — ABNORMAL HIGH
Elm IgE: <0.1 kU/L
IgE (Immunoglobulin E), Serum: 457 kU/L — ABNORMAL HIGH (ref <=114)
Johnson Grass: 0.35 kU/L — ABNORMAL HIGH
Pecan/Hickory Tree IgE: <0.1 kU/L
Rough Pigweed  IgE: <0.1 kU/L
Sheep Sorrel IgE: <0.1 kU/L
Timothy Grass: 0.37 kU/L — ABNORMAL HIGH

## 2019-11-23 LAB — RHEUMATOID FACTOR: Rheumatoid fact SerPl-aCnc: <14 IU/mL (ref <14)

## 2019-11-23 LAB — CYCLIC CITRUL PEPTIDE ANTIBODY, IGG: Cyclic Citrullin Peptide Ab: <16 UNITS

## 2019-11-23 LAB — ANA: Anti Nuclear Antibody (ANA): NEGATIVE

## 2019-11-23 LAB — INTERPRETATION:

## 2019-11-23 NOTE — Progress Notes (Signed)
Called and left message on voicemail to please return phone call. Contact number provided. Voicemail stated the number belonged to a person named Rosemarie Ax so will wait for call back to make sure we are reaching the right person.

## 2019-11-24 NOTE — Progress Notes (Signed)
Patient returned call. Verified identity. Went over lab result per Dr Elsworth Soho. All questions answered and patient expressed full understanding. Nothing further needed at this time.

## 2019-11-25 ENCOUNTER — Other Ambulatory Visit: Payer: Self-pay

## 2019-11-25 ENCOUNTER — Encounter: Payer: Self-pay | Admitting: Internal Medicine

## 2019-11-25 ENCOUNTER — Ambulatory Visit (INDEPENDENT_AMBULATORY_CARE_PROVIDER_SITE_OTHER): Payer: Medicare Other | Admitting: Internal Medicine

## 2019-11-25 VITALS — BP 140/80 | Temp 98.1°F | Wt 182.7 lb

## 2019-11-25 DIAGNOSIS — R5383 Other fatigue: Secondary | ICD-10-CM

## 2019-11-25 DIAGNOSIS — M791 Myalgia, unspecified site: Secondary | ICD-10-CM

## 2019-11-25 DIAGNOSIS — M2550 Pain in unspecified joint: Secondary | ICD-10-CM | POA: Diagnosis not present

## 2019-11-25 MED ORDER — DOXYCYCLINE HYCLATE 100 MG PO TABS: 100.0000 mg | ORAL_TABLET | Freq: Two times a day (BID) | ORAL | 0 refills | Status: DC

## 2019-11-25 NOTE — Patient Instructions (Signed)
-  Nice seeing you today!!  -Lab work today; will notify you once results are available.  -Start doxycycline 100 mg twice daily for 14 days.  -Return if no better after 2 weeks.

## 2019-11-25 NOTE — Progress Notes (Signed)
Acute Office Visit     This visit occurred during the SARS-CoV-2 public health emergency.  Safety protocols were in place, including screening questions prior to the visit, additional usage of staff PPE, and extensive cleaning of exam room while observing appropriate contact time as indicated for disinfecting solutions.    CC/Reason for Visit: Diffuse joint and muscle pain  HPI: Charles Lloyd. is a 79 y.o. male who is coming in today for the above mentioned reasons.  He spent the majority of the month of August and the early part of September up in the [place].  He did lots of outdoor activities including hiking.  For the past 2 weeks he has been having diffuse joint pain, myalgias and fatigue.  He denies fever, headache.  He saw pulmonary earlier this week where he had an ANA, and RA factor and CCP which all resulted negative.  It has gotten to the point where he has difficulty even getting out of a chair.  His thighs, knees, hips, fingers, shoulders seem to be mostly affected as well as his upper arms.  He never noticed any tick bites or erythema migrans/rash.   Past Medical/Surgical History: Past Medical History:  Diagnosis Date  . Acute tracheobronchitis 01/05/2019  . B12 deficiency   . 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 Cancer ([place])   . Cardiomyopathy ([place]) 03/10/2018   With chronic atrial fibrillation  . CHF (congestive heart failure) ([place]) 04/16/2013   Functional class II, ejection fraction 35-40%  Formatting of this note might be different from the original. Functional class II, ejection fraction 35-40%  . Chronic anticoagulation   . Chronic diastolic (congestive) heart failure (HCC) 04/16/2013   Functional class II, ejection fraction 35-40%  Last Assessment & Plan:  Clinically stable Volume well controlled meds reviewed  . Chronic diastolic CHF (congestive heart failure) ([place]) 04/16/2013   Functional class II, ejection fraction 35-40%  Last  Assessment & Plan:  Clinically stable Volume well controlled meds reviewed  . Colon polyps 04/16/2013  . Diabetic neuropathy ([place])   . Eosinophil count raised 02/17/2019  . Essential hypertension 01/06/2010   Last Assessment & Plan:  Well controlled Continue med management  . 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  Steroid usage was discontinued several months ago.  Follow up with Dermatology as planned.  . Hypercholesterolemia 01/06/2010   Last Assessment & Plan:  Repeat labs recommended  . Hyperlipidemia associated with type 2 diabetes mellitus ([place]) 01/28/2018  . Hypertensive heart disease with heart failure ([place]) 01/06/2010   Last Assessment & Plan:  Well controlled Continue med management  . Hyponatremia 12/17/2018  . 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 Infection of prosthetic right knee joint ([place])   . 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 Lesion of liver 04/20/2019   -On cardiac CT 02/2019  . Mild reactive airways disease   . 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 On amiodarone therapy 09/07/2013  . On continuous oral anticoagulation 01/28/2018  . Other activity(E029.9)  04/20/2013   Formatting of this note might be different from the original. Transthoracic Echocardiogram-03/10/2013-[facility]: Normal left ventricular wall thickness and cavity size.  Global left ventricular systolic function is moderately reduced.  The estimated ejection fraction is 35-40%.  The left atrium is moderately enlarged.  The right atrium is mildly enlarged.  No significant valvular    . Overweight (BMI 25.0-29.9) 10/22/2016  . Persistent atrial fibrillation ([place]) 04/16/2013   Last Assessment & Plan:  Rate controlled Continue med management eliquis for stroke preventino  Formatting of this note might be different from the original.  Drug  HX Current Rx Pre-ABL inefficacy Pre-ABL intolerant Post-ABL inefficacy Post-ABL intolerant max dose/24h M/Y end comments  sotalol                  dofetilide                  flecainide                  propafenone                  am  . S/P TKR (total knee replacement), bilateral   . Secondary hypercoagulable state ([place]) 04/09/2019  . Tinea cruris 04/16/2013  . Type 2 diabetes mellitus with diabetic polyneuropathy, without long-term current use of insulin ([place]) 04/16/2013   Last Assessment & Plan:  Labs today Pt reports well controlled on ambulatory monitoring  . Vitamin D deficiency 07/25/2018    Past Surgical History:  Procedure Laterality Date  . ATRIAL FIBRILLATION ABLATION N/A 03/12/2019   Procedure: ATRIAL FIBRILLATION ABLATION;  Surgeon: [name], MD;  Location: [facility];  Service: Cardiovascular;  Laterality: N/A;  . CARDIAC ELECTROPHYSIOLOGY STUDY AND ABLATION    . CARDIOVERSION    . CARDIOVERSION N/A 10/10/2018   Procedure: CARDIOVERSION;  Surgeon: [name], MD;  Location: [facility] ENDOS[facility];  Service: Cardiovascular;  Laterality: N/A;  . CHOLECYSTECTOMY    . PROSTATECTOMY    . REPLACEMENT TOTAL KNEE BILATERAL      Social History:  reports that he has never smoked. He has never used smokeless tobacco. He reports current alcohol use of about 2.0 standard drinks of alcohol per week. He reports that he does not use drugs.  Allergies: No Known Allergies  Family History:  Family History  Problem Relation Age of Onset  . Cancer Mother   . Depression Mother   . Early death Mother   . Cancer Father   . Depression Father   . Early death Father      Current Outpatient Medications:  .  atorvastatin  (LIPITOR) 20 MG tablet, TAKE 1 TABLET DAILY AT 6 P.M., Disp: 90 tablet, Rfl: 1 .  ELIQUIS 5 MG TABS tablet, TAKE 1 TABLET TWICE A DAY, Disp: 180 tablet, Rfl: 3 .  fluticasone (FLONASE) 50 MCG/ACT nasal spray, Place 2 sprays into both nostrils daily., Disp: 16 g, Rfl: 2 .  fluticasone furoate-vilanterol (BREO ELLIPTA) 100-25 MCG/INH AEPB, Inhale 1 puff into the lungs daily., Disp: 60 each, Rfl: 5 .  gabapentin (NEURONTIN) 300 MG capsule, TAKE 2 CAPSULES AT BEDTIME, Disp: 180 capsule, Rfl: 1 .  glucose blood ([employer] TEST STRIPS) test strip, Test 2x dailyDx: type 2 diabetes, uncontrolledfluctuating blood sugars, Disp: 100 each, Rfl: 0 .  glucose blood test strip, Use to test blood sugars once daily as directed Dx:E11.65, Disp: , Rfl:  .  metFORMIN (GLUCOPHAGE) 500 MG tablet, TAKE 1 TABLET TWICE  A DAY WITH MEALS, Disp: 180 tablet, Rfl: 1 .  metoprolol tartrate (LOPRESSOR) 50 MG tablet, TAKE 2 TABLETS (100 MG TOTAL) BY MOUTH 2 (TWO) TIMES DAILY., Disp: 360 tablet, Rfl: 1 .  montelukast (SINGULAIR) 10 MG tablet, Take 10 mg by mouth at bedtime. , Disp: , Rfl:  .  potassium chloride SA (KLOR-CON M20) 20 MEQ tablet, Take 1 tablet (20 mEq total) by mouth daily., Disp: 90 tablet, Rfl: 3 .  torsemide (DEMADEX) 20 MG tablet, Take 20 mg by mouth daily., Disp: , Rfl:  .  doxycycline (VIBRA-TABS) 100 MG tablet, Take 1 tablet (100 mg total) by mouth 2 (two) times daily for 14 days., Disp: 28 tablet, Rfl: 0  Review of Systems:  Constitutional: Denies fever, chills, diaphoresis, appetite change. HEENT: Denies photophobia, eye pain, redness, hearing loss, ear pain, congestion, sore throat, rhinorrhea, sneezing, mouth sores, trouble swallowing, neck pain, neck stiffness and tinnitus.   Respiratory: Denies SOB, DOE, cough, chest tightness,  and wheezing.   Cardiovascular: Denies chest pain, palpitations and leg swelling.  Gastrointestinal: Denies nausea, vomiting, abdominal pain, diarrhea, constipation, blood in stool  and abdominal distention.  Genitourinary: Denies dysuria, urgency, frequency, hematuria, flank pain and difficulty urinating.  Endocrine: Denies: hot or cold intolerance, sweats, changes in hair or nails, polyuria, polydipsia. Musculoskeletal: Denies back pain, joint swelling. Skin: Denies pallor, rash and wound.  Neurological: Denies dizziness, seizures, syncope, weakness, light-headedness, numbness and headaches.  Hematological: Denies adenopathy. Easy bruising, personal or family bleeding history  Psychiatric/Behavioral: Denies suicidal ideation, mood changes, confusion, nervousness, sleep disturbance and agitation    Physical Exam: Vitals:   11/25/19 0823  BP: 140/80  Temp: 98.1 F (36.7 C)  TempSrc: Oral  Weight: 182 lb 11.2 oz (82.9 kg)    Body mass index is 25.48 kg/m.   Constitutional: NAD, calm, comfortable Eyes: PERRL, lids and conjunctivae normal ENMT: Mucous membranes are moist.  Respiratory: clear to auscultation bilaterally, no wheezing, no crackles. Normal respiratory effort. No accessory muscle use.  Cardiovascular: Regular rate and rhythm, no murmurs / rubs / gallops. No extremity edema.  Musculoskeletal: No joint deformity, swelling Skin: no rashes, lesions, ulcers. No induration Neurologic: Grossly intact and nonfocal Psychiatric: Normal judgment and insight. Alert and oriented x 3. Normal mood.    Impression and Plan:  Arthralgia, unspecified joint  Myalgia  Fatigue, unspecified type  -I am concerned about possibility of early Lyme disease given his nonspecific complaints and recent trip to the [place] with outdoor activities.  He does not recall a specific tick bite, does not recall any rash that could be described as erythema migrans.  Certainly no erythema migrans is present on exam today. -I will check antibodies for Lyme disease, will also add RMSF and Ehrlichia titers although these would be less likely up in the [facility]. -Will also do basic  labs including TSH and B12 as hypothyroidism and vitamin B12 deficiency can also cause some of his presentation although the timeline would be more consistent with a tickborne illness. -He will be prescribed doxycycline 100 mg twice daily for 14 days. -Follow-up in 2 weeks if no improvement.    Patient Instructions  -Nice seeing you today!!  -Lab work today; will notify you once results are available.  -Start doxycycline 100 mg twice daily for 14 days.  -Return if no better after 2 weeks.     Lelon Frohlich, MD [facility] at [facility]

## 2019-11-27 ENCOUNTER — Telehealth: Payer: Self-pay | Admitting: Internal Medicine

## 2019-11-27 ENCOUNTER — Other Ambulatory Visit: Payer: Self-pay | Admitting: Internal Medicine

## 2019-11-27 DIAGNOSIS — M791 Myalgia, unspecified site: Secondary | ICD-10-CM

## 2019-11-27 DIAGNOSIS — R5383 Other fatigue: Secondary | ICD-10-CM

## 2019-11-27 DIAGNOSIS — M2550 Pain in unspecified joint: Secondary | ICD-10-CM

## 2019-11-27 MED ORDER — DOXYCYCLINE HYCLATE 100 MG PO TABS
100.0000 mg | ORAL_TABLET | Freq: Two times a day (BID) | ORAL | 0 refills | Status: AC
Start: 2019-11-27 — End: 2019-12-11

## 2019-11-27 NOTE — Telephone Encounter (Signed)
Please let him know that preliminary testing for Lyme disease was positive. I am waiting for confirmatory antibodies and may not have them back before the weekend. I would not advise prednisone. Would instead ask that he continue the prescribed course of doxycycline, but we need to extend it to 28 days, so will send in Rx for an additional 14 days. May use ibuprofen as needed for muscle aches and joint pains.

## 2019-11-27 NOTE — Telephone Encounter (Signed)
Patient is aware 

## 2019-11-27 NOTE — Telephone Encounter (Signed)
Pt is calling in stating that he would like to see if he can get a prescription for prednisone for the pain that he has.  Pharm:  [facility] [address]

## 2019-12-03 ENCOUNTER — Telehealth: Payer: Self-pay | Admitting: Internal Medicine

## 2019-12-03 MED ORDER — METHYLPREDNISOLONE 4 MG PO TBPK: ORAL_TABLET | ORAL | 0 refills | Status: DC

## 2019-12-03 NOTE — Telephone Encounter (Signed)
Patient called and advised of Dr. Barbie Banner recommendation for Medrol Dose pack, confirmed to [facility]. Pharmacy called and spoke to [place], [facility] and given Rx, he verbalized understanding.

## 2019-12-03 NOTE — Telephone Encounter (Signed)
Pt states Dr Jerilee Hoh gave him heavy duty antibiotics a week ago, however he feels worse now than he did when he came in.  He is requesting a call back.

## 2019-12-03 NOTE — Telephone Encounter (Signed)
Pt contacted and confirmed his symptoms.   Pt states that he has been taking the abx (doxycycline) for a week and his symptoms of joint pain has not gotten better. Pt states that he was being treated for Lyme disease.    Pt is requesting additional recommendations to help with his arthralgia and fatigue.

## 2019-12-03 NOTE — Telephone Encounter (Signed)
Lets see if some steroids can help him feel better. Call in a 4 mg Medrol dose pack (21 tablets)

## 2019-12-04 LAB — EHRLICHIA ANTIBODY PANEL
E. CHAFFEENSIS AB IGG: <1:64 {titer}
E. CHAFFEENSIS AB IGM: <1:20 {titer}

## 2019-12-04 LAB — B. BURGDORFI ANTIBODIES BY WB
B burgdorferi IgG Abs (IB): NEGATIVE
B burgdorferi IgM Abs (IB): NEGATIVE
Lyme Disease 18 kD IgG: REACTIVE — AB
Lyme Disease 23 kD IgG: REACTIVE — AB
Lyme Disease 23 kD IgM: NONREACTIVE
Lyme Disease 28 kD IgG: NONREACTIVE
Lyme Disease 30 kD IgG: NONREACTIVE
Lyme Disease 39 kD IgG: REACTIVE — AB
Lyme Disease 39 kD IgM: NONREACTIVE
Lyme Disease 41 kD IgG: REACTIVE — AB
Lyme Disease 41 kD IgM: NONREACTIVE
Lyme Disease 45 kD IgG: NONREACTIVE
Lyme Disease 58 kD IgG: NONREACTIVE
Lyme Disease 66 kD IgG: NONREACTIVE
Lyme Disease 93 kD IgG: NONREACTIVE

## 2019-12-04 LAB — ROCKY MTN SPOTTED FVR ABS PNL(IGG+IGM)
RMSF IgG: NOT DETECTED
RMSF IgM: NOT DETECTED

## 2019-12-04 LAB — LYME AB SCREEN %: Lyme AB Screen: 1.2 index — ABNORMAL HIGH

## 2019-12-04 LAB — CK: Total CK: 47 U/L (ref 44–196)

## 2019-12-04 LAB — VITAMIN B12: Vitamin B-12: 241 pg/mL (ref 200–1100)

## 2019-12-04 LAB — TSH: TSH: 1.23 mIU/L (ref 0.40–4.50)

## 2019-12-04 LAB — SEDIMENTATION RATE: Sed Rate: 14 mm/h (ref 0–20)

## 2019-12-18 ENCOUNTER — Other Ambulatory Visit: Payer: Self-pay

## 2019-12-18 ENCOUNTER — Encounter: Payer: Self-pay | Admitting: Internal Medicine

## 2019-12-18 ENCOUNTER — Ambulatory Visit: Payer: Medicare Other | Admitting: Internal Medicine

## 2019-12-18 VITALS — BP 110/70 | HR 67 | Temp 98.6°F | Ht 71.0 in | Wt 176.5 lb

## 2019-12-18 DIAGNOSIS — E1142 Type 2 diabetes mellitus with diabetic polyneuropathy: Secondary | ICD-10-CM

## 2019-12-18 DIAGNOSIS — R634 Abnormal weight loss: Secondary | ICD-10-CM | POA: Diagnosis not present

## 2019-12-18 DIAGNOSIS — M353 Polymyalgia rheumatica: Secondary | ICD-10-CM

## 2019-12-18 DIAGNOSIS — M2550 Pain in unspecified joint: Secondary | ICD-10-CM

## 2019-12-18 MED ORDER — PREDNISONE 10 MG PO TABS: 10.0000 mg | ORAL_TABLET | Freq: Every day | ORAL | 1 refills | Status: DC

## 2019-12-18 NOTE — Patient Instructions (Signed)
-  Nice seeing you today!!  -Lab work today; will notify you once results are available.  -Start prednisone 10 mg daily.  -Referral to rheumatology has been placed.

## 2019-12-18 NOTE — Progress Notes (Signed)
Established Patient Office Visit     This visit occurred during the SARS-CoV-2 public health emergency.  Safety protocols were in place, including screening questions prior to the visit, additional usage of staff PPE, and extensive cleaning of exam room while observing appropriate contact time as indicated for disinfecting solutions.    CC/Reason for Visit: Continued joint pain and weight loss  HPI: Charles Lloyd. is a 79 y.o. male who is coming in today for the above mentioned reasons.  He was seen by me on September 29 with complaints of diffuse joint aches and muscle aches after a trip to the [place].  I started him on doxycycline for a presumptive diagnosis of Lyme disease.  His Lyme disease panel did come back positive for IgG antibodies but negative for IgM antibodies denoting most likely a remote and not an acute infection.  He has been taking his antibiotic that was prescribed for 21 days. He has 3 days remaining.  He continues to feel "terrible".  He has lost 8 pounds by our scale since his last visit.  Most of his issues are at the shoulder level.  He has difficulty lifting his hands above his head.  He states that putting his shirt on is difficult.  He also has significant issues in his wrists and hands to the point where grasping the steering wheel has become more difficult.  He initially had some pain around his hips and thighs but this seems to have improved.  Knees, ankles, feet have never been an issue.  He complains of severe decreased appetite.  While I was out of the office he called in with continued issues and a steroid pack was prescribed for him.  He states that while on the steroids "he felt like a million bucks".  But soon after stopping the steroids his symptoms returned.  He has not had any fevers, no jaw pain, no vision loss or headaches.  I have performed recent laboratory review.  TSH was normal, vitamin B12 was low normal at 241.  ANA was negative, anti-CCP was  negative, rheumatoid factor was negative, Lyme disease antibody panel as described above, Ehrlichia and [facility] spotted fever antibodies were negative.  A CK was normal.   Past Medical/Surgical History: Past Medical History:  Diagnosis Date  . Acute tracheobronchitis 01/05/2019  . B12 deficiency   . 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 Cancer ([place])   . Cardiomyopathy ([place]) 03/10/2018   With chronic atrial fibrillation  . CHF (congestive heart failure) ([place]) 04/16/2013   Functional class II, ejection fraction 35-40%  Formatting of this note might be different from the original. Functional class II, ejection fraction 35-40%  . Chronic anticoagulation   . Chronic diastolic (congestive) heart failure (HCC) 04/16/2013   Functional class II, ejection fraction 35-40%  Last Assessment & Plan:  Clinically stable Volume well controlled meds reviewed  . Chronic diastolic CHF (congestive heart failure) ([place]) 04/16/2013   Functional class II, ejection fraction 35-40%  Last Assessment & Plan:  Clinically stable Volume well controlled meds reviewed  . Colon polyps 04/16/2013  . Diabetic neuropathy ([place])   . Eosinophil count raised 02/17/2019  . Essential hypertension 01/06/2010   Last Assessment & Plan:  Well controlled Continue med management  . 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  Steroid usage was discontinued several months ago.  Follow up with Dermatology as  planned.  . Hypercholesterolemia 01/06/2010   Last Assessment & Plan:  Repeat labs recommended  . Hyperlipidemia associated with type 2 diabetes mellitus ([place]) 01/28/2018  . Hypertensive heart disease with heart failure ([place]) 01/06/2010   Last Assessment & Plan:  Well controlled Continue med management  . Hyponatremia 12/17/2018  . 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 Infection of prosthetic right knee joint ([place])   . 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 Lesion of liver 04/20/2019   -On cardiac CT 02/2019  . Mild reactive airways disease   . 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 On amiodarone therapy 09/07/2013  . On continuous oral anticoagulation 01/28/2018  . Other activity(E029.9) 04/20/2013   Formatting of this note might be different from the original. Transthoracic Echocardiogram-03/10/2013-[facility]: Normal left ventricular wall thickness and cavity size.  Global left ventricular systolic function is moderately reduced.  The estimated ejection fraction is 35-40%.  The left atrium is moderately enlarged.  The right atrium is mildly enlarged.  No significant valvular   . Overweight (BMI 25.0-29.9) 10/22/2016  . Persistent atrial fibrillation ([place]) 04/16/2013   Last Assessment & Plan:  Rate controlled Continue med management eliquis for stroke preventino  Formatting of this note might be different from the original.  Drug  HX Current Rx Pre-ABL inefficacy Pre-ABL intolerant Post-ABL inefficacy Post-ABL intolerant max dose/24h M/Y end comments  sotalol                  dofetilide                  flecainide                  propafenone                  am  . S/P TKR (total knee replacement), bilateral   . Secondary hypercoagulable state ([place]) 04/09/2019  . Tinea cruris 04/16/2013  . Type 2 diabetes mellitus with diabetic polyneuropathy, without long-term current use of insulin ([place]) 04/16/2013   Last Assessment & Plan:  Labs today Pt reports well controlled on ambulatory monitoring  . Vitamin D deficiency 07/25/2018    Past Surgical History:  Procedure Laterality Date    . ATRIAL FIBRILLATION ABLATION N/A 03/12/2019   Procedure: ATRIAL FIBRILLATION ABLATION;  Surgeon: [name], MD;  Location: [facility];  Service: Cardiovascular;  Laterality: N/A;  . CARDIAC ELECTROPHYSIOLOGY STUDY AND ABLATION    . CARDIOVERSION    . CARDIOVERSION N/A 10/10/2018   Procedure: CARDIOVERSION;  Surgeon: [name], MD;  Location: [facility] ENDOSCOPY;  Service: Cardiovascular;  Laterality: N/A;  . CHOLECYSTECTOMY    . PROSTATECTOMY    . REPLACEMENT TOTAL KNEE BILATERAL      Social History:  reports that he has never smoked. He has never used smokeless tobacco. He reports current alcohol use of about 2.0 standard drinks of alcohol per week. He reports that he does not use drugs.  Allergies: No Known Allergies  Family History:  Family History  Problem Relation Age of Onset  . Cancer Mother   .  Depression Mother   . Early death Mother   . Cancer Father   . Depression Father   . Early death Father      Current Outpatient Medications:  .  atorvastatin (LIPITOR) 20 MG tablet, TAKE 1 TABLET DAILY AT 6 P.M., Disp: 90 tablet, Rfl: 1 .  ELIQUIS 5 MG TABS tablet, TAKE 1 TABLET TWICE A DAY, Disp: 180 tablet, Rfl: 3 .  fluticasone (FLONASE) 50 MCG/ACT nasal spray, Place 2 sprays into both nostrils daily., Disp: 16 g, Rfl: 2 .  fluticasone furoate-vilanterol (BREO ELLIPTA) 100-25 MCG/INH AEPB, Inhale 1 puff into the lungs daily., Disp: 60 each, Rfl: 5 .  gabapentin (NEURONTIN) 300 MG capsule, TAKE 2 CAPSULES AT BEDTIME, Disp: 180 capsule, Rfl: 1 .  glucose blood ([employer] TEST STRIPS) test strip, Test 2x dailyDx: type 2 diabetes, uncontrolledfluctuating blood sugars, Disp: 100 each, Rfl: 0 .  glucose blood test strip, Use to test blood sugars once daily as directed Dx:E11.65, Disp: , Rfl:  .  metFORMIN (GLUCOPHAGE) 500 MG tablet, TAKE 1 TABLET TWICE A DAY WITH MEALS, Disp: 180 tablet, Rfl: 1 .  metoprolol tartrate (LOPRESSOR) 50 MG tablet, TAKE 2 TABLETS (100 MG  TOTAL) BY MOUTH 2 (TWO) TIMES DAILY., Disp: 360 tablet, Rfl: 1 .  montelukast (SINGULAIR) 10 MG tablet, Take 10 mg by mouth at bedtime. , Disp: , Rfl:  .  potassium chloride SA (KLOR-CON M20) 20 MEQ tablet, Take 1 tablet (20 mEq total) by mouth daily., Disp: 90 tablet, Rfl: 3 .  torsemide (DEMADEX) 20 MG tablet, Take 20 mg by mouth daily., Disp: , Rfl:  .  predniSONE (DELTASONE) 10 MG tablet, Take 1 tablet (10 mg total) by mouth daily with breakfast., Disp: 30 tablet, Rfl: 1  Review of Systems:  Constitutional: Denies fever, chills, diaphoresis. HEENT: Denies photophobia, eye pain, redness, hearing loss, ear pain, congestion, sore throat, rhinorrhea, sneezing, mouth sores, trouble swallowing, neck pain, neck stiffness and tinnitus.   Respiratory: Denies SOB, DOE, cough, chest tightness,  and wheezing.   Cardiovascular: Denies chest pain, palpitations and leg swelling.  Gastrointestinal: Denies nausea, vomiting, abdominal pain, diarrhea, constipation, blood in stool and abdominal distention.  Genitourinary: Denies dysuria, urgency, frequency, hematuria, flank pain and difficulty urinating.  Endocrine: Denies: hot or cold intolerance, sweats, changes in hair or nails, polyuria, polydipsia. Musculoskeletal: Positive for myalgias, back pain, joint swelling, arthralgias and gait problem.  Skin: Denies pallor, rash and wound.  Neurological: Denies dizziness, seizures, syncope, weakness, light-headedness, numbness and headaches.  Hematological: Denies adenopathy. Easy bruising, personal or family bleeding history  Psychiatric/Behavioral: Denies suicidal ideation, mood changes, confusion, nervousness, sleep disturbance and agitation    Physical Exam: Vitals:   12/18/19 1030  BP: 110/70  Pulse: 67  Temp: 98.6 F (37 C)  TempSrc: Oral  SpO2: 96%  Weight: 176 lb 8 oz (80.1 kg)  Height: _0  (1.803 m)    Body mass index is 24.62 kg/m.   Constitutional: NAD, calm, comfortable Eyes:  PERRL, lids and conjunctivae normal ENMT: Mucous membranes are moist.  Respiratory: clear to auscultation bilaterally, no wheezing, no crackles. Normal respiratory effort. No accessory muscle use.  Cardiovascular: Regular rate and rhythm, no murmurs / rubs / gallops. No extremity edema.  Neurologic: Grossly intact and nonfocal Psychiatric: Normal judgment and insight. Alert and oriented x 3. Normal mood.    Impression and Plan:  PMR (polymyalgia rheumatica) (HCC) Weight loss Arthralgia, unspecified joint  -Most of his issues seem to be at the shoulder girdle  with some issues at the hand and wrist level.  This coupled with rapid improvement on steroids makes me wonder if this could be polymyalgia rheumatica. -I will check some additional lab work today including complete CBC, ESR, CRP, vitamin D levels. -He does not have any symptoms of giant cell arteritis such as vision loss, jaw pain, headache. -I will try low-dose steroids: Prednisone 10 mg daily. -I will also send him to rheumatology for further evaluation. -Other diagnostic considerations could be seronegative arthritis, fibromyalgia.  Type 2 diabetes mellitus with diabetic polyneuropathy, without long-term current use of insulin (HCC) -Check A1c today. -I am concerned that with daily steroid use his diabetes will become uncontrolled.   Patient Instructions  -Nice seeing you today!!  -Lab work today; will notify you once results are available.  -Start prednisone 10 mg daily.  -Referral to rheumatology has been placed.     Lelon Frohlich, MD [facility] at [facility]

## 2019-12-19 LAB — CBC WITH DIFFERENTIAL/PLATELET
Absolute Monocytes: 623 cells/uL (ref 200–950)
Basophils Absolute: 38 cells/uL (ref 0–200)
Basophils Relative: 0.5 %
Eosinophils Absolute: 488 cells/uL (ref 15–500)
Eosinophils Relative: 6.5 %
HCT: 37.7 % — ABNORMAL LOW (ref 38.5–50.0)
Hemoglobin: 12 g/dL — ABNORMAL LOW (ref 13.2–17.1)
Lymphs Abs: 945 cells/uL (ref 850–3900)
MCH: 29.6 pg (ref 27.0–33.0)
MCHC: 31.8 g/dL — ABNORMAL LOW (ref 32.0–36.0)
MCV: 93.1 fL (ref 80.0–100.0)
MPV: 11.8 fL (ref 7.5–12.5)
Monocytes Relative: 8.3 %
Neutro Abs: 5408 cells/uL (ref 1500–7800)
Neutrophils Relative %: 72.1 %
Platelets: 189 10*3/uL (ref 140–400)
RBC: 4.05 10*6/uL — ABNORMAL LOW (ref 4.20–5.80)
RDW: 13.2 % (ref 11.0–15.0)
Total Lymphocyte: 12.6 %
WBC: 7.5 10*3/uL (ref 3.8–10.8)

## 2019-12-19 LAB — HEMOGLOBIN A1C
Hgb A1c MFr Bld: 6.2 % of total Hgb — ABNORMAL HIGH (ref <5.7)
Mean Plasma Glucose: 131 (calc)
eAG (mmol/L): 7.3 (calc)

## 2019-12-19 LAB — C-REACTIVE PROTEIN: CRP: 3.5 mg/L (ref <8.0)

## 2019-12-19 LAB — VITAMIN D 25 HYDROXY (VIT D DEFICIENCY, FRACTURES): Vit D, 25-Hydroxy: 30 ng/mL (ref 30–100)

## 2019-12-19 LAB — SEDIMENTATION RATE: Sed Rate: 2 mm/h (ref 0–20)

## 2019-12-19 LAB — CK: Total CK: 39 U/L — ABNORMAL LOW (ref 44–196)

## 2019-12-21 ENCOUNTER — Other Ambulatory Visit: Payer: Self-pay | Admitting: Internal Medicine

## 2019-12-22 ENCOUNTER — Other Ambulatory Visit: Payer: Self-pay | Admitting: Internal Medicine

## 2019-12-22 DIAGNOSIS — E559 Vitamin D deficiency, unspecified: Secondary | ICD-10-CM

## 2019-12-22 MED ORDER — VITAMIN D (ERGOCALCIFEROL) 1.25 MG (50000 UNIT) PO CAPS: 50000.0000 [IU] | ORAL_CAPSULE | ORAL | 0 refills | Status: DC

## 2019-12-27 ENCOUNTER — Other Ambulatory Visit: Payer: Self-pay | Admitting: Internal Medicine

## 2019-12-28 ENCOUNTER — Other Ambulatory Visit: Payer: Self-pay | Admitting: Internal Medicine

## 2020-01-24 NOTE — Progress Notes (Signed)
Office Visit Note  Patient: Charles Lloyd.             Date of Birth: November 02, 1940           MRN: [id number]             PCP: Isaac Bliss, [name], MD Referring: Isaac Bliss, Estel* Visit Date: 01/25/2020  Subjective:  New Patient (Initial Visit)   History of Present Illness: Charles Woon. is a 79 y.o. male with a history of Afib, congestive heart failure, type 2 diabetes with peripheral neuropathy, basal cell carcinoma, and prostate cancer here for evaluation of diffuse body pain with concern for possible PMR He experienced diffuse severe body pain that started abruptly around September after he returned from a trip to the [place]. He had workup including ehrlichia, RMSF, RF, CCP, ANA that were unrevealing. He took a 4 week course of antibiotic for a few positive B. Borgdorferi bands on WB without clinical improvement. He was prescribed a course of 18m prednisone in October and he states that within 1 day of starting this he felt 90% or better symptom improvement. He has not taken any since about 2 weeks ago with no return of symptoms so far. Today he is feeling well with none of the previous symptoms complaints his only active complaint is his lower extremity numbness that is chronic.  Labs reviewed 11/2019 Vitamin D 30 CK 39 CRP 3.5 ESR 2 CBC Hgb 12.0  10/2019 ANA negative RF negative CCP negative   Activities of Daily Living:  Patient reports morning stiffness for 0 minutes.   Patient Denies nocturnal pain.  Difficulty dressing/grooming: Denies Difficulty climbing stairs: Denies Difficulty getting out of chair: Denies Difficulty using hands for taps, buttons, cutlery, and/or writing: Denies  Review of Systems  Constitutional: Negative for fatigue.  HENT: Negative for mouth sores, mouth dryness and nose dryness.   Eyes: Negative for pain, itching, visual disturbance and dryness.  Respiratory: Negative for cough, hemoptysis, shortness of breath and  difficulty breathing.   Cardiovascular: Negative for chest pain, palpitations and swelling in legs/feet.  Gastrointestinal: Negative for abdominal pain, blood in stool, constipation and diarrhea.  Endocrine: Negative for increased urination.  Genitourinary: Negative for painful urination.  Musculoskeletal: Negative for arthralgias, joint pain, joint swelling, myalgias, muscle weakness, morning stiffness, muscle tenderness and myalgias.  Skin: Negative for color change, rash and redness.  Allergic/Immunologic: Negative for susceptible to infections.  Neurological: Positive for numbness. Negative for dizziness, headaches, memory loss and weakness.  Hematological: Negative for swollen glands.  Psychiatric/Behavioral: Negative for confusion and sleep disturbance.    PMFS History:  Patient Active Problem List   Diagnosis Date Noted   Arthralgia 11/20/2019   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   Lesion of liver 04/20/2019   Secondary hypercoagulable state ([facility] 04/09/2019   Eosinophil count raised 02/17/2019   Acute tracheobronchitis 01/05/2019   Mild reactive airways disease    Hyponatremia 12/17/2018   Vitamin D deficiency 07/25/2018   Cardiomyopathy ([facility] [place] 03/10/2018   Chronic anticoagulation 03/06/2018   Hyperlipidemia associated with type 2 diabetes mellitus ([facility] 01/28/2018   Basal cell carcinoma (BCC) of left side of nose 01/28/2018   Infected prosthetic knee joint ([facility] 06/24/2017   B12 deficiency 10/22/2016   Overweight (BMI 25.0-29.9) 10/22/2016   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 04/16/2013   CHF (congestive heart failure) ([facility] 04/16/2013  Colon polyps 04/16/2013   Type 2 diabetes mellitus with diabetic polyneuropathy, without long-term current use of insulin ([place]) 04/16/2013   Tinea cruris 04/16/2013   Neoplasm of  prostate 04/16/2013   Diabetic neuropathy ([place]) 07/14/2012   Insomnia 03/04/2012   Hypertensive heart disease with heart failure ([place]) 01/06/2010   Neoplasm of prostate, malignant ([place]) 01/06/2010   Hypercholesterolemia 01/06/2010    Past Medical History:  Diagnosis Date   Acute tracheobronchitis 01/05/2019   B12 deficiency    Basal cell carcinoma (BCC) of left side of nose 01/28/2018   Cancer ([place])    Cardiomyopathy ([place]) 03/10/2018   With chronic atrial fibrillation   CHF (congestive heart failure) ([place]) 04/16/2013   Functional class II, ejection fraction 35-40%  Formatting of this note might be different from the original. Functional class II, ejection fraction 35-40%   Chronic anticoagulation    Chronic diastolic (congestive) heart failure ([place]) 04/16/2013   Functional class II, ejection fraction 35-40%  Last Assessment & Plan:  Clinically stable Volume well controlled meds reviewed   Chronic diastolic CHF (congestive heart failure) ([place]) 04/16/2013   Functional class II, ejection fraction 35-40%  Last Assessment & Plan:  Clinically stable Volume well controlled meds reviewed   Colon polyps 04/16/2013   Diabetic neuropathy (HCC)    Eosinophil count raised 02/17/2019   Essential hypertension 01/06/2010   Last Assessment & Plan:  Well controlled Continue med management   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  Steroid usage was discontinued several months ago.  Follow up with Dermatology as planned.   Hypercholesterolemia 01/06/2010   Last Assessment & Plan:  Repeat labs recommended   Hyperlipidemia associated with type 2 diabetes mellitus ([place]) 01/28/2018   Hypertensive heart disease with heart failure ([place]) 01/06/2010   Last Assessment & Plan:  Well controlled Continue med management   Hyponatremia 12/17/2018   Infected prosthetic knee joint ([place]) 06/24/2017   Last Assessment & Plan:  Id following  ?ongoing abx managementy reported Request records   Infection of prosthetic right knee joint ([place])    Insomnia 03/04/2012   Grief with loss of wife 02/20/12  Last Assessment & Plan:  Improved sx  contineu xanax prn Se discussed   Lesion of liver 04/20/2019   -On cardiac CT 02/2019   Mild reactive airways disease    Neoplasm of prostate 04/16/2013   Neoplasm of prostate, malignant ([place]) 01/06/2010   [name] S/p radiation therapy   Last Assessment & Plan:  Repeat psa today No urinary sx Pt reported fatigue/poor appetite   On amiodarone therapy 09/07/2013   On continuous oral anticoagulation 01/28/2018   Other activity(E029.9) 04/20/2013   Formatting of this note might be different from the original. Transthoracic Echocardiogram-03/10/[facility]: Normal left ventricular wall thickness and cavity size.  Global left ventricular systolic function is moderately reduced.  The estimated ejection fraction is 35-40%.  The left atrium is moderately enlarged.  The right atrium is mildly enlarged.  No significant valvular    Overweight (BMI 25.0-29.9) 10/22/2016   Persistent atrial fibrillation ([place]) 04/16/2013   Last Assessment & Plan:  Rate controlled Continue med management eliquis for stroke preventino  Formatting of this note might be different from the original.  Drug  HX Current Rx Pre-ABL inefficacy Pre-ABL intolerant Post-ABL inefficacy Post-ABL intolerant max dose/24h M/Y end comments  sotalol  dofetilide                  flecainide                  propafenone                  am   S/P TKR (total knee replacement), bilateral    Secondary hypercoagulable state ([place]) 04/09/2019   Tinea cruris 04/16/2013   Type 2 diabetes mellitus with diabetic polyneuropathy, without long-term current use of insulin ([place]) 04/16/2013   Last Assessment & Plan:  Labs today Pt reports well controlled on ambulatory monitoring   Vitamin D deficiency 07/25/2018    Family History    Problem Relation Age of Onset   Cancer Mother    Depression Mother    Early death Mother    Cancer Father    Depression Father    Early death Father    Past Surgical History:  Procedure Laterality Date   ATRIAL FIBRILLATION ABLATION N/A 03/12/2019   Procedure: ATRIAL FIBRILLATION ABLATION;  Surgeon: [name], MD;  Location: [facility];  Service: Cardiovascular;  Laterality: N/A;   CARDIAC ELECTROPHYSIOLOGY STUDY AND ABLATION     CARDIOVERSION     CARDIOVERSION N/A 10/10/2018   Procedure: CARDIOVERSION;  Surgeon: [name], MD;  Location: [facility] ENDOSCOPY;  Service: Cardiovascular;  Laterality: N/A;   CHOLECYSTECTOMY     PROSTATECTOMY     REPLACEMENT TOTAL KNEE BILATERAL     Social History   Social History Narrative   Not on file   Immunization History  Administered Date(s) Administered   Fluad Quad(high Dose 65+) 11/25/2018   Influenza, High Dose Seasonal PF 11/21/2010, 01/19/2013, 02/01/2014, 02/07/2015, 12/28/2015, 10/22/2016, 01/28/2018   Influenza,trivalent, recombinat, inj, PF 01/11/2012   Influenza-Unspecified 12/09/2009   PFIZER SARS-COV-2 Vaccination 04/19/2019, 05/13/2019, 11/21/2019   Pneumococcal Conjugate-13 01/19/2013   Pneumococcal Polysaccharide-23 12/01/2008   Tdap 10/28/2006   Zoster 10/28/2006   Zoster Recombinat (Shingrix) 01/28/2018, 07/24/2018     Objective: Vital Signs: BP (!) 136/94 (BP Location: Left Arm, Patient Position: Sitting, Cuff Size: Small)    Pulse (!) 112    Ht 5' 9"  (1.753 m)    Wt 186 lb (84.4 kg)    BMI 27.47 kg/m    Physical Exam HENT:     Right Ear: External ear normal.     Left Ear: External ear normal.     Mouth/Throat:     Mouth: Mucous membranes are moist.     Pharynx: Oropharynx is clear.  Eyes:     Conjunctiva/sclera: Conjunctivae normal.  Cardiovascular:     Rate and Rhythm: Regular rhythm. Tachycardia present.  Pulmonary:     Effort: Pulmonary effort is normal.      Breath sounds: Normal breath sounds.  Skin:    General: Skin is warm and dry.     Comments: Numerous seborrheic keratoses  Neurological:     Mental Status: He is alert.     Comments: Diminished sensation over both feet and ankles  Psychiatric:        Mood and Affect: Mood normal.     Musculoskeletal Exam:  Neck full range of motion no tenderness Shoulders full ROM no tenderness or swelling Elbows extension ROM is reduced b/l without tenderness or swelling, fingers with bony nodules diffusely and slightly reduced extension ROM Normal hip internal and external rotation without pain, no tenderness to lateral hip palpation Knees full ROM well healed surgical scars anteriorly  Ankles, MTPs full range of motion no swelling, sensation dminished    Investigation: No additional findings.  Imaging: No results found.  Recent Labs: Lab Results  Component Value Date   WBC 7.5 12/18/2019   HGB 12.0 (L) 12/18/2019   PLT 189 12/18/2019   NA 143 09/30/2019   K 4.0 09/30/2019   CL 106 09/30/2019   CO2 27 09/30/2019   GLUCOSE 151 (H) 09/30/2019   BUN 25 09/30/2019   CREATININE 1.45 (H) 09/30/2019   BILITOT 1.1 01/06/2019   ALKPHOS 84 01/06/2019   AST 19 01/06/2019   ALT 21 01/06/2019   PROT 6.0 (L) 01/06/2019   ALBUMIN 3.5 01/06/2019   CALCIUM 9.4 09/30/2019   GFRAA 55 (L) 07/16/2019    Speciality Comments: No specialty comments available.  Procedures:  No procedures performed Allergies: Patient has no known allergies.   Assessment / Plan:     Visit Diagnoses: Pain of both wrist joints Polyarthralgias  Charles Lloyd had an episode of severe pain in numerous joints but today is feeling well. This is after taking 75m prednisone about a month and had great relief with this. No symptoms have returned for 2 weeks off treatment. Based on symptoms staying improved, normal inflammatory markers in initial workup, and on the described pattern sounds less consistent with PMR. However I  think the best course of action is to wait and reevaluate if and when symptoms return this is much more likely to be useful for diagnostic workup. No new treatments or tests at this time he is instructed to call the clinic back when anything returns.  Orders: No orders of the defined types were placed in this encounter.  No orders of the defined types were placed in this encounter.   Follow-Up Instructions: Return if symptoms worsen or fail to improve.   CCollier Salina MD  Note - This record has been created using [employer]  Chart creation errors have been sought, but may not always  have been located. Such creation errors do not reflect on  the standard of medical care.

## 2020-01-25 ENCOUNTER — Ambulatory Visit (INDEPENDENT_AMBULATORY_CARE_PROVIDER_SITE_OTHER): Payer: Medicare Other | Admitting: Internal Medicine

## 2020-01-25 ENCOUNTER — Other Ambulatory Visit: Payer: Self-pay

## 2020-01-25 ENCOUNTER — Encounter: Payer: Self-pay | Admitting: Internal Medicine

## 2020-01-25 VITALS — BP 136/94 | HR 112 | Ht 69.0 in | Wt 186.0 lb

## 2020-01-25 DIAGNOSIS — M25531 Pain in right wrist: Secondary | ICD-10-CM | POA: Diagnosis not present

## 2020-01-25 DIAGNOSIS — M25532 Pain in left wrist: Secondary | ICD-10-CM

## 2020-01-25 NOTE — Patient Instructions (Signed)
I do not see evidence of inflammatory disease on exam today. Based on the lack of current symptoms I believe it would be better to not start any investigation or treatment at this time but we can look again if and when you have symptoms again. Feel free to contact our clinic straight away if you do start to notice these problems again.

## 2020-01-28 ENCOUNTER — Telehealth: Payer: Self-pay | Admitting: Cardiology

## 2020-01-28 NOTE — Telephone Encounter (Signed)
STAT if HR is under 50 or over 120 (normal HR is 60-100 beats per minute)  1) What is your heart rate? 125, 130 this morning- started a few days ago  2) Do you have a log of your heart rate readings (document readings)? No  3) Do you have any other symptoms? No- pt would like to be seen please

## 2020-01-28 NOTE — Telephone Encounter (Signed)
Spoke to the patient just now and per Dr. Joya Gaskins recommendation he has been scheduled to see Dr. Harriet Masson tomorrow in [facility] so that an EKG can be done at this time. He verbalizes understanding of this and is appreciative of the call back.

## 2020-01-29 ENCOUNTER — Other Ambulatory Visit: Payer: Self-pay

## 2020-01-29 ENCOUNTER — Ambulatory Visit: Payer: Medicare Other | Admitting: Cardiology

## 2020-01-29 ENCOUNTER — Encounter: Payer: Self-pay | Admitting: Cardiology

## 2020-01-29 VITALS — BP 124/78 | HR 124 | Ht 70.0 in | Wt 177.0 lb

## 2020-01-29 DIAGNOSIS — I4891 Unspecified atrial fibrillation: Secondary | ICD-10-CM

## 2020-01-29 DIAGNOSIS — I5042 Chronic combined systolic (congestive) and diastolic (congestive) heart failure: Secondary | ICD-10-CM | POA: Diagnosis not present

## 2020-01-29 DIAGNOSIS — I110 Hypertensive heart disease with heart failure: Secondary | ICD-10-CM

## 2020-01-29 MED ORDER — DILTIAZEM HCL ER COATED BEADS 120 MG PO CP24: 120.0000 mg | ORAL_CAPSULE | Freq: Every day | ORAL | 3 refills | Status: DC

## 2020-01-29 NOTE — Progress Notes (Signed)
Cardiology Office Note:    Date:  01/29/2020   ID:  Charles Gum., DOB 09/20/1940, MRN [id number]  PCP:  Isaac Bliss, [name], MD  Cardiologist:  No primary care provider on file.  Electrophysiologist:  Will Meredith Leeds, MD   Referring MD: Isaac Bliss, [place]*   " I am having some worsening palpiations  History of Present Illness:    Charles Lloyd. is a 79 y.o. male with a hx of persistentatrial fibrillation with initial ablation in 2015 at [facility], subsequently had cardioversion in 2017 and again repeat electrical cardioversion in September 2019, heart failure EF 40%, hypertension and hyperlipidemia. He is maintained on chronic anticoagulation with Eliquisreferred to EP for consideration of pulmonary vein isolation. He had pulmonary vein isolation again Dr. Curt Bears 03/12/2019 successfully resuming sinus rhythm. Ejection fraction 50 to 55% by echo in atrial fibrillation.   The patient was seen by Dr. Bettina Gavia in May 2021 at that time giving his progression calcium channel blocker was stopped. In July 2021 he did see EP and he was still in sinus rhythm. Today he comes reporting that he had significant palpitations overnight and called our office and requested to be seen.   Past Medical History:  Diagnosis Date  . Acute tracheobronchitis 01/05/2019  . B12 deficiency   . 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 Cancer ([place])   . Cardiomyopathy ([place]) 03/10/2018   With chronic atrial fibrillation  . CHF (congestive heart failure) ([place]) 04/16/2013   Functional class II, ejection fraction 35-40%  Formatting of this note might be different from the original. Functional class II, ejection fraction 35-40%  . Chronic anticoagulation   . Chronic diastolic (congestive) heart failure (HCC) 04/16/2013   Functional class II, ejection fraction 35-40%  Last Assessment & Plan:  Clinically stable Volume well controlled meds reviewed  . Chronic diastolic CHF  (congestive heart failure) ([place]) 04/16/2013   Functional class II, ejection fraction 35-40%  Last Assessment & Plan:  Clinically stable Volume well controlled meds reviewed  . Colon polyps 04/16/2013  . Diabetic neuropathy ([place])   . Eosinophil count raised 02/17/2019  . Essential hypertension 01/06/2010   Last Assessment & Plan:  Well controlled Continue med management  . 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  Steroid usage was discontinued several months ago.  Follow up with Dermatology as planned.  . Hypercholesterolemia 01/06/2010   Last Assessment & Plan:  Repeat labs recommended  . Hyperlipidemia associated with type 2 diabetes mellitus ([place]) 01/28/2018  . Hypertensive heart disease with heart failure ([place]) 01/06/2010   Last Assessment & Plan:  Well controlled Continue med management  . Hyponatremia 12/17/2018  . 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 Infection of prosthetic right knee joint ([place])   . 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 Lesion of liver 04/20/2019   -On cardiac CT 02/2019  . Mild reactive airways disease   . 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 On amiodarone therapy 09/07/2013  . On continuous oral anticoagulation 01/28/2018  .  Other activity(E029.9) 04/20/2013   Formatting of this note might be different from the original. Transthoracic Echocardiogram-03/10/2013-[facility]: Normal left ventricular wall thickness and cavity size.  Global left ventricular systolic function is moderately reduced.  The estimated ejection fraction is 35-40%.  The  left atrium is moderately enlarged.  The right atrium is mildly enlarged.  No significant valvular   . Overweight (BMI 25.0-29.9) 10/22/2016  . Persistent atrial fibrillation ([place]) 04/16/2013   Last Assessment & Plan:  Rate controlled Continue med management eliquis for stroke preventino  Formatting of this note might be different from the original.  Drug  HX Current Rx Pre-ABL inefficacy Pre-ABL intolerant Post-ABL inefficacy Post-ABL intolerant max dose/24h M/Y end comments  sotalol                  dofetilide                  flecainide                  propafenone                  am  . S/P TKR (total knee replacement), bilateral   . Secondary hypercoagulable state ([place]) 04/09/2019  . Tinea cruris 04/16/2013  . Type 2 diabetes mellitus with diabetic polyneuropathy, without long-term current use of insulin ([place]) 04/16/2013   Last Assessment & Plan:  Labs today Pt reports well controlled on ambulatory monitoring  . Vitamin D deficiency 07/25/2018    Past Surgical History:  Procedure Laterality Date  . ATRIAL FIBRILLATION ABLATION N/A 03/12/2019   Procedure: ATRIAL FIBRILLATION ABLATION;  Surgeon: [name], MD;  Location: [place] [facility];  Service: Cardiovascular;  Laterality: N/A;  . CARDIAC ELECTROPHYSIOLOGY STUDY AND ABLATION    . CARDIOVERSION    . CARDIOVERSION N/A 10/10/2018   Procedure: CARDIOVERSION;  Surgeon: [name], MD;  Location: [facility];  Service: Cardiovascular;  Laterality: N/A;  . CHOLECYSTECTOMY    . PROSTATECTOMY    . REPLACEMENT TOTAL KNEE BILATERAL      Current Medications: Current Meds  Medication Sig  . atorvastatin (LIPITOR) 20 MG tablet TAKE 1 TABLET DAILY AT 6 P.M.  . ELIQUIS 5 MG TABS tablet TAKE 1 TABLET TWICE A DAY  . fluticasone (FLONASE) 50 MCG/ACT nasal spray Place 2 sprays into both nostrils daily.  . fluticasone furoate-vilanterol (BREO ELLIPTA) 100-25 MCG/INH AEPB Inhale 1 puff into the lungs daily.  Marland Kitchen gabapentin (NEURONTIN) 300  MG capsule TAKE 2 CAPSULES AT BEDTIME  . glucose blood ([employer] TEST STRIPS) test strip Test 2x dailyDx: type 2 diabetes, uncontrolledfluctuating blood sugars  . glucose blood test strip Use to test blood sugars once daily as directed Dx:E11.65  . metFORMIN (GLUCOPHAGE) 500 MG tablet TAKE 1 TABLET TWICE A DAY WITH MEALS  . metoprolol tartrate (LOPRESSOR) 50 MG tablet TAKE 2 TABLETS (100 MG TOTAL) BY MOUTH 2 (TWO) TIMES DAILY.  . montelukast (SINGULAIR) 10 MG tablet Take 10 mg by mouth at bedtime.   . potassium chloride SA (KLOR-CON M20) 20 MEQ tablet Take 1 tablet (20 mEq total) by mouth daily.  . predniSONE (DELTASONE) 10 MG tablet Take 1 tablet (10 mg total) by mouth daily with breakfast.  . RESTASIS 0.05 % ophthalmic emulsion 1 drop 2 (two) times daily.  Marland Kitchen torsemide (DEMADEX) 20 MG tablet Take 20 mg by mouth daily.  . Vitamin D, Ergocalciferol, (DRISDOL) 1.25 MG (50000 UNIT) CAPS capsule Take 1 capsule (50,000 Units  total) by mouth every 7 (seven) days for 12 doses.     Allergies:   Patient has no known allergies.   Social History   Socioeconomic History  . Marital status: Widowed    Spouse name: Not on file  . Number of children: Not on file  . Years of education: Not on file  . Highest education level: Not on file  Occupational History  . Not on file  Tobacco Use  . Smoking status: Never Smoker  . Smokeless tobacco: Never Used  Vaping Use  . Vaping Use: Never used  Substance and Sexual Activity  . Alcohol use: Yes    Alcohol/week: 2.0 standard drinks    Types: 2 Glasses of wine per week    Comment: daily  . Drug use: Never  . Sexual activity: Not Currently  Other Topics Concern  . Not on file  Social History Narrative  . Not on file   Social Determinants of Health   Financial Resource Strain:   . Difficulty of Paying Living Expenses: Not on file  Food Insecurity:   . Worried About Charity fundraiser in the Last Year: Not on file  . Ran Out of Food in the Last  Year: Not on file  Transportation Needs:   . Lack of Transportation (Medical): Not on file  . Lack of Transportation (Non-Medical): Not on file  Physical Activity:   . Days of Exercise per Week: Not on file  . Minutes of Exercise per Session: Not on file  Stress:   . Feeling of Stress : Not on file  Social Connections:   . Frequency of Communication with Friends and Family: Not on file  . Frequency of Social Gatherings with Friends and Family: Not on file  . Attends Religious Services: Not on file  . Active Member of Clubs or Organizations: Not on file  . Attends Archivist Meetings: Not on file  . Marital Status: Not on file     Family History: The patient's family history includes Cancer in his father and mother; Depression in his father and mother; Early death in his father and mother.  ROS:   Review of Systems  Constitution: Negative for decreased appetite, fever and weight gain.  HENT: Negative for congestion, ear discharge, hoarse voice and sore throat.   Eyes: Negative for discharge, redness, vision loss in right eye and visual halos.  Cardiovascular: Reports palpitations. Negative for chest pain, dyspnea on exertion, leg swelling, orthopnea. Respiratory: Negative for cough, hemoptysis, shortness of breath and snoring.   Endocrine: Negative for heat intolerance and polyphagia.  Hematologic/Lymphatic: Negative for bleeding problem. Does not bruise/bleed easily.  Skin: Negative for flushing, nail changes, rash and suspicious lesions.  Musculoskeletal: Negative for arthritis, joint pain, muscle cramps, myalgias, neck pain and stiffness.  Gastrointestinal: Negative for abdominal pain, bowel incontinence, diarrhea and excessive appetite.  Genitourinary: Negative for decreased libido, genital sores and incomplete emptying.  Neurological: Negative for brief paralysis, focal weakness, headaches and loss of balance.  Psychiatric/Behavioral: Negative for altered mental  status, depression and suicidal ideas.  Allergic/Immunologic: Negative for HIV exposure and persistent infections.    EKGs/Labs/Other Studies Reviewed:    The following studies were reviewed today:   EKG:  The ekg ordered today demonstrates atrial fibrillation with rapid ventricular rate.  Heart rate 125 bpm.  Compared to EKG done in July 2021 patient was in sinus rhythm at that time.  Recent Labs: 09/30/2019: BUN 25; Creat 1.45; Potassium 4.0; Sodium 143  11/25/2019: TSH 1.23 12/18/2019: Hemoglobin 12.0; Platelets 189  Recent Lipid Panel    Component Value Date/Time   CHOL 135 04/21/2019 0940   TRIG 81 04/21/2019 0940   HDL 41 04/21/2019 0940   CHOLHDL 3.3 04/21/2019 0940   CHOLHDL 3 07/24/2018 1058   VLDL 25.0 07/24/2018 1058   LDLCALC 78 04/21/2019 0940    Physical Exam:    VS:  BP 124/78   Pulse (!) 124   Ht 5\' 10"  (1.778 m)   Wt 177 lb (80.3 kg)   SpO2 98%   BMI 25.40 kg/m     Wt Readings from Last 3 Encounters:  01/29/20 177 lb (80.3 kg)  01/25/20 186 lb (84.4 kg)  12/18/19 176 lb 8 oz (80.1 kg)     GEN: Well nourished, well developed in no acute distress HEENT: Normal NECK: No JVD; No carotid bruits LYMPHATICS: No lymphadenopathy CARDIAC: S1S2 noted,RRR, no murmurs, rubs, gallops RESPIRATORY:  Clear to auscultation without rales, wheezing or rhonchi  ABDOMEN: Soft, non-tender, non-distended, +bowel sounds, no guarding. EXTREMITIES: No edema, No cyanosis, no clubbing MUSCULOSKELETAL:  No deformity  SKIN: Warm and dry NEUROLOGIC:  Alert and oriented x 3, non-focal PSYCHIATRIC:  Normal affect, good insight  ASSESSMENT:    1. Chronic combined systolic and diastolic heart failure ([place])   2. Hypertensive heart disease with heart failure ([place])   3. Atrial fibrillation with controlled ventricular rate (HCC)    PLAN:     He is in atrial fibrillation today. What I like to do is place the patient back on Cardizem [place]ak blocker to slow his rate hopefully when  his rate is slower he will be able to eventually convert to sinus rhythm. He has had multiple ablations as well as cardioversions. He may benefit from antiarrhythmics but for now hoping that rate control can spontaneously convert him to sinus rhythm.   He will remain on his anticoagulation for stroke prevention The patient is in agreement with the above plan. The patient left the office in stable condition.  The patient will follow up in Dr. Bettina Gavia in 2 weeks.   Medication Adjustments/Labs and Tests Ordered: Current medicines are reviewed at length with the patient today.  Concerns regarding medicines are outlined above.  Orders Placed This Encounter  Procedures  . Basic metabolic panel  . TSH  . Magnesium  . EKG 12-Lead   Meds ordered this encounter  Medications  . diltiazem (CARDIZEM CD) 120 MG 24 hr capsule    Sig: Take 1 capsule (120 mg total) by mouth daily.    Dispense:  90 capsule    Refill:  3    Patient Instructions  Medication Instructions:  Your physician has recommended you make the following change in your medication:  1. START CARDIZEM 120 MG ONCE DAILY.  *If you need a refill on your cardiac medications before your next appointment, please call your pharmacy*   Lab Work: TODAY: BMET, MAGNESIUM, TSH If you have labs (blood work) drawn today and your tests are completely normal, you will receive your results only by: Marland Kitchen MyChart Message (if you have MyChart) OR . A paper copy in the mail If you have any lab test that is abnormal or we need to change your treatment, we will call you to review the results.   Testing/Procedures: NOEN   Follow-Up: At [facility], you and your health needs are our priority.  As part of our continuing mission to provide you with exceptional heart care, we have  created designated Provider Care Teams.  These Care Teams include your primary Cardiologist (physician) and Advanced Practice Providers (APPs -  Physician Assistants and  Nurse Practitioners) who all work together to provide you with the care you need, when you need it.  We recommend signing up for the patient portal called "MyChart".  Sign up information is provided on this After Visit Summary.  MyChart is used to connect with patients for Virtual Visits (Telemedicine).  Patients are able to view lab/test results, encounter notes, upcoming appointments, etc.  Non-urgent messages can be sent to your provider as well.   To learn more about what you can do with MyChart, go to [email or web address].    Your next appointment:   2 week(s)  The format for your next appointment:   In Person  Provider:   Berniece Salines, DO       Adopting a Healthy Lifestyle.  Know what a healthy weight is for you (roughly BMI <25) and aim to maintain this   Aim for 7+ servings of fruits and vegetables daily   65-80+ fluid ounces of water or unsweet tea for healthy kidneys   Limit to max 1 drink of alcohol per day; avoid smoking/tobacco   Limit animal fats in diet for cholesterol and heart health - choose grass fed whenever available   Avoid highly processed foods, and foods high in saturated/trans fats   Aim for low stress - take time to unwind and care for your mental health   Aim for 150 min of moderate intensity exercise weekly for heart health, and weights twice weekly for bone health   Aim for 7-9 hours of sleep daily   When it comes to diets, agreement about the perfect plan isnt easy to find, even among the experts. Experts at the [employer] developed an idea known as the Healthy Eating Plate. Just imagine a plate divided into logical, healthy portions.   The emphasis is on diet quality:   Load up on vegetables and fruits - one-half of your plate: Aim for color and variety, and remember that potatoes dont count.   Go for whole grains - one-quarter of your plate: Whole wheat, barley, wheat berries, quinoa, oats, brown rice, and foods  made with them. If you want pasta, go with whole wheat pasta.   Protein power - one-quarter of your plate: Fish, chicken, beans, and nuts are all healthy, versatile protein sources. Limit red meat.   The diet, however, does go beyond the plate, offering a few other suggestions.   Use healthy plant oils, such as olive, canola, soy, corn, sunflower and peanut. Check the labels, and avoid partially hydrogenated oil, which have unhealthy trans fats.   If youre thirsty, drink water. Coffee and tea are good in moderation, but skip sugary drinks and limit milk and dairy products to one or two daily servings.   The type of carbohydrate in the diet is more important than the amount. Some sources of carbohydrates, such as vegetables, fruits, whole grains, and beans-are healthier than others.   Finally, stay active  Signed, Berniece Salines, DO  01/29/2020 1:47 PM    [facility]

## 2020-01-29 NOTE — Patient Instructions (Signed)
Medication Instructions:  Your physician has recommended you make the following change in your medication:  1. START CARDIZEM 120 MG ONCE DAILY.  *If you need a refill on your cardiac medications before your next appointment, please call your pharmacy*   Lab Work: TODAY: BMET, MAGNESIUM, TSH If you have labs (blood work) drawn today and your tests are completely normal, you will receive your results only by: Marland Kitchen MyChart Message (if you have MyChart) OR . A paper copy in the mail If you have any lab test that is abnormal or we need to change your treatment, we will call you to review the results.   Testing/Procedures: NOEN   Follow-Up: At [facility], you and your health needs are our priority.  As part of our continuing mission to provide you with exceptional heart care, we have created designated Provider Care Teams.  These Care Teams include your primary Cardiologist (physician) and Advanced Practice Providers (APPs -  Physician Assistants and Nurse Practitioners) who all work together to provide you with the care you need, when you need it.  We recommend signing up for the patient portal called "MyChart".  Sign up information is provided on this After Visit Summary.  MyChart is used to connect with patients for Virtual Visits (Telemedicine).  Patients are able to view lab/test results, encounter notes, upcoming appointments, etc.  Non-urgent messages can be sent to your provider as well.   To learn more about what you can do with MyChart, go to [email or web address].    Your next appointment:   2 week(s)  The format for your next appointment:   In Person  Provider:   Berniece Salines, DO

## 2020-01-30 LAB — BASIC METABOLIC PANEL
BUN/Creatinine Ratio: 16 (ref 10–24)
BUN: 23 mg/dL (ref 8–27)
CO2: 23 mmol/L (ref 20–29)
Calcium: 9.7 mg/dL (ref 8.6–10.2)
Chloride: 100 mmol/L (ref 96–106)
Creatinine, Ser: 1.4 mg/dL — ABNORMAL HIGH (ref 0.76–1.27)
GFR calc Af Amer: 56 mL/min/{1.73_m2} — ABNORMAL LOW (ref >59)
GFR calc non Af Amer: 48 mL/min/{1.73_m2} — ABNORMAL LOW (ref >59)
Glucose: 180 mg/dL — ABNORMAL HIGH (ref 65–99)
Potassium: 4 mmol/L (ref 3.5–5.2)
Sodium: 142 mmol/L (ref 134–144)

## 2020-01-30 LAB — MAGNESIUM: Magnesium: 1.5 mg/dL — ABNORMAL LOW (ref 1.6–2.3)

## 2020-01-30 LAB — TSH: TSH: 0.912 u[IU]/mL (ref 0.450–4.500)

## 2020-02-01 ENCOUNTER — Telehealth: Payer: Self-pay

## 2020-02-01 MED ORDER — MAGNESIUM 400 MG PO CAPS: 400.0000 mg | ORAL_CAPSULE | Freq: Two times a day (BID) | ORAL | 0 refills | Status: DC

## 2020-02-01 NOTE — Telephone Encounter (Signed)
-----   Message from Berniece Salines, DO sent at 01/31/2020 12:04 PM EST ----- His creatinine is stable, however his magnesium is low.  Start the patient on magnesium oxide 400 mg twice a day for the next 2 weeks.

## 2020-02-01 NOTE — Telephone Encounter (Signed)
Spoke with patient regarding results and recommendation.  Patient verbalizes understanding and is agreeable to plan of care. Advised patient to call back with any issues or concerns.  

## 2020-02-02 DIAGNOSIS — Z96653 Presence of artificial knee joint, bilateral: Secondary | ICD-10-CM | POA: Insufficient documentation

## 2020-02-02 DIAGNOSIS — C801 Malignant (primary) neoplasm, unspecified: Secondary | ICD-10-CM | POA: Insufficient documentation

## 2020-02-02 DIAGNOSIS — T8453XA Infection and inflammatory reaction due to internal right knee prosthesis, initial encounter: Secondary | ICD-10-CM | POA: Insufficient documentation

## 2020-02-11 NOTE — Progress Notes (Signed)
Cardiology Office Note:    Date:  02/12/2020   ID:  Charles Gum., DOB [birth year], MRN [id number]  PCP:  Isaac Bliss, [name], MD  Cardiologist:  [name], MD    Referring MD: Isaac Bliss, [place]*    ASSESSMENT:    1. Paroxysmal atrial fibrillation (HCC)   2. Chronic anticoagulation   3. Hypertensive heart disease with heart failure ([place])    PLAN:    In order of problems listed above:  1. At this time rates remain rapid discontinue Cardizem with GI side effects start digoxin check level 1 week continue high dose beta-blocker anticoagulant and I will facilitate EP evaluation and told him I thought he do best with a trial of dofetilide and cardioversion if needed. 2. Continues anticoagulant 3. Stable BP is at target tenuous current loop diuretic antihypertensives   Next appointment: Depending upon EP evaluation   Medication Adjustments/Labs and Tests Ordered: Current medicines are reviewed at length with the patient today.  Concerns regarding medicines are outlined above.  No orders of the defined types were placed in this encounter.  No orders of the defined types were placed in this encounter.  Chief complaint: Follow-up recurrent atrial fibrillation rapid ventricular response  History of Present Illness:    Charles Lloyd. is a 79 y.o. male with a hx of persistent atrial fibrillation failing initially EP catheter ablation with multiple cardioversions heart failure EF 40% and subsequent pulmonary vein isolation 03/12/2019 resuming sinus rhythm and improved ejection fraction 50 to 55%.  He was last seen 01/29/2020 and found to be back in atrial fibrillation with a rapid ventricular rate. Compliance with diet, lifestyle and medications: Yes  He is intolerant of Cardizem paradoxically having loose stools. Heart rate at home remains rapid 1 10-1 20 and he does not feel well jittery weak No palpitation shortness of breath chest pain or syncope He  tolerates his anticoagulant without bleeding He was intolerant of amiodarone in the past He is quite frustrated and ask what his choices are. The first is remained in atrial fibrillation we will stop Cardizem placement digoxin for rate control along with his high-dose beta-blocker 1 week check BMP proBNP. The second is a either additional EP catheter ablation or dofetilide I will ask him to be seen by Dr. Curt Bears Final choice if he cannot get to a good place with tolerance of drugs and heart rate control [place] consider AV nodal ablation and pacemaker. Past Medical History:  Diagnosis Date  . Acute tracheobronchitis 01/05/2019  . B12 deficiency   . 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 Cancer ([place])   . Cardiomyopathy ([place]) 03/10/2018   With chronic atrial fibrillation  . CHF (congestive heart failure) ([place]) 04/16/2013   Functional class II, ejection fraction 35-40%  Formatting of this note might be different from the original. Functional class II, ejection fraction 35-40%  . Chronic anticoagulation   . Chronic diastolic (congestive) heart failure (HCC) 04/16/2013   Functional class II, ejection fraction 35-40%  Last Assessment & Plan:  Clinically stable Volume well controlled meds reviewed  . Chronic diastolic CHF (congestive heart failure) ([place]) 04/16/2013   Functional class II, ejection fraction 35-40%  Last Assessment & Plan:  Clinically stable Volume well controlled meds reviewed  . Colon polyps 04/16/2013  . Diabetic neuropathy ([place])   . Eosinophil count raised 02/17/2019  . Essential hypertension 01/06/2010   Last Assessment & Plan:  Well controlled Continue med management  .  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  Steroid usage was discontinued several months ago.  Follow up with Dermatology as planned.  . Hypercholesterolemia 01/06/2010   Last Assessment & Plan:  Repeat labs recommended  . Hyperlipidemia  associated with type 2 diabetes mellitus ([place]) 01/28/2018  . Hypertensive heart disease with heart failure ([place]) 01/06/2010   Last Assessment & Plan:  Well controlled Continue med management  . Hyponatremia 12/17/2018  . 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 Infection of prosthetic right knee joint ([place])   . 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 Lesion of liver 04/20/2019   -On cardiac CT 02/2019  . Mild reactive airways disease   . 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 On amiodarone therapy 09/07/2013  . On continuous oral anticoagulation 01/28/2018  . Other activity(E029.9) 04/20/2013   Formatting of this note might be different from the original. Transthoracic Echocardiogram-03/10/[facility]: Normal left ventricular wall thickness and cavity size.  Global left ventricular systolic function is moderately reduced.  The estimated ejection fraction is 35-40%.  The left atrium is moderately enlarged.  The right atrium is mildly enlarged.  No significant valvular   . Overweight (BMI 25.0-29.9) 10/22/2016  . Persistent atrial fibrillation ([place]) 04/16/2013   Last Assessment & Plan:  Rate controlled Continue med management eliquis for stroke preventino  Formatting of this note might be different from the original.  Drug  HX Current Rx Pre-ABL inefficacy Pre-ABL intolerant Post-ABL inefficacy Post-ABL intolerant max dose/24h M/Y end comments  sotalol                  dofetilide                  flecainide                  propafenone                  am  . S/P TKR (total knee replacement), bilateral   . Secondary hypercoagulable  state ([place]) 04/09/2019  . Tinea cruris 04/16/2013  . Type 2 diabetes mellitus with diabetic polyneuropathy, without long-term current use of insulin ([place]) 04/16/2013   Last Assessment & Plan:  Labs today Pt reports well controlled on ambulatory monitoring  . Vitamin D deficiency 07/25/2018    Past Surgical History:  Procedure Laterality Date  . ATRIAL FIBRILLATION ABLATION N/A 03/12/2019   Procedure: ATRIAL FIBRILLATION ABLATION;  Surgeon: [name], MD;  Location: [facility];  Service: Cardiovascular;  Laterality: N/A;  . CARDIAC ELECTROPHYSIOLOGY STUDY AND ABLATION    . CARDIOVERSION    . CARDIOVERSION N/A 10/10/2018   Procedure: CARDIOVERSION;  Surgeon: [name], MD;  Location: [facility] ENDOSCOPY;  Service: Cardiovascular;  Laterality: N/A;  . CHOLECYSTECTOMY    . PROSTATECTOMY    . REPLACEMENT TOTAL KNEE BILATERAL      Current Medications: Current Meds  Medication Sig  . atorvastatin (LIPITOR) 20 MG tablet TAKE 1 TABLET DAILY AT 6 P.M.  . diltiazem (CARDIZEM CD) 120 MG 24  hr capsule Take 1 capsule (120 mg total) by mouth daily.  Marland Kitchen ELIQUIS 5 MG TABS tablet TAKE 1 TABLET TWICE A DAY  . fluticasone (FLONASE) 50 MCG/ACT nasal spray Place 2 sprays into both nostrils daily.  . fluticasone furoate-vilanterol (BREO ELLIPTA) 100-25 MCG/INH AEPB Inhale 1 puff into the lungs daily.  Marland Kitchen gabapentin (NEURONTIN) 300 MG capsule TAKE 2 CAPSULES AT BEDTIME  . glucose blood ([employer] TEST STRIPS) test strip Test 2x dailyDx: type 2 diabetes, uncontrolledfluctuating blood sugars  . glucose blood test strip Use to test blood sugars once daily as directed Dx:E11.65  . Magnesium 400 MG CAPS Take 400 mg by mouth in the morning and at bedtime.  . metFORMIN (GLUCOPHAGE) 500 MG tablet TAKE 1 TABLET TWICE A DAY WITH MEALS  . metoprolol tartrate (LOPRESSOR) 50 MG tablet TAKE 2 TABLETS (100 MG TOTAL) BY MOUTH 2 (TWO) TIMES DAILY.  . montelukast (SINGULAIR) 10 MG tablet Take 10 mg by mouth at  bedtime.   . potassium chloride SA (KLOR-CON M20) 20 MEQ tablet Take 1 tablet (20 mEq total) by mouth daily.  . RESTASIS 0.05 % ophthalmic emulsion 1 drop 2 (two) times daily.  Marland Kitchen torsemide (DEMADEX) 20 MG tablet Take 20 mg by mouth daily.     Allergies:   Patient has no known allergies.   Social History   Socioeconomic History  . Marital status: Widowed    Spouse name: Not on file  . Number of children: Not on file  . Years of education: Not on file  . Highest education level: Not on file  Occupational History  . Not on file  Tobacco Use  . Smoking status: Never Smoker  . Smokeless tobacco: Never Used  Vaping Use  . Vaping Use: Never used  Substance and Sexual Activity  . Alcohol use: Yes    Alcohol/week: 2.0 standard drinks    Types: 2 Glasses of wine per week    Comment: daily  . Drug use: Never  . Sexual activity: Not Currently  Other Topics Concern  . Not on file  Social History Narrative  . Not on file   Social Determinants of Health   Financial Resource Strain: Not on file  Food Insecurity: Not on file  Transportation Needs: Not on file  Physical Activity: Not on file  Stress: Not on file  Social Connections: Not on file     Family History: The patient's family history includes Cancer in his father and mother; Depression in his father and mother; Early death in his father and mother. ROS:   Please see the history of present illness.    All other systems reviewed and are negative.  EKGs/Labs/Other Studies Reviewed:    The following studies were reviewed today:  EKG:  EKG ordered today and personally reviewed.  The ekg ordered today demonstrates atrial fibrillation rate is relatively high 100 210 bpm otherwise normal  Recent Labs: 12/18/2019: Hemoglobin 12.0; Platelets 189 01/29/2020: BUN 23; Creatinine, Ser 1.40; Magnesium 1.5; Potassium 4.0; Sodium 142; TSH 0.912  Recent Lipid Panel    Component Value Date/Time   CHOL 135 04/21/2019 0940   TRIG 81  04/21/2019 0940   HDL 41 04/21/2019 0940   CHOLHDL 3.3 04/21/2019 0940   CHOLHDL 3 07/24/2018 1058   VLDL 25.0 07/24/2018 1058   LDLCALC 78 04/21/2019 0940    Physical Exam:    VS:  BP 136/80   Pulse (!) 104   Ht 5\' 10"  (1.778 m)   Wt 184 lb (  83.5 kg)   SpO2 97%   BMI 26.40 kg/m     Wt Readings from Last 3 Encounters:  02/12/20 184 lb (83.5 kg)  01/29/20 177 lb (80.3 kg)  01/25/20 186 lb (84.4 kg)     GEN: He does not look acutely ill well nourished, well developed in no acute distress HEENT: Normal NECK: No JVD; No carotid bruits LYMPHATICS: No lymphadenopathy CARDIAC: Rapid S1 variable irregular rhythm no murmurs, rubs, gallops RESPIRATORY:  Clear to auscultation without rales, wheezing or rhonchi  ABDOMEN: Soft, non-tender, non-distended MUSCULOSKELETAL:  No edema; No deformity  SKIN: Warm and dry NEUROLOGIC:  Alert and oriented x 3 PSYCHIATRIC:  Normal affect    Signed, [name], MD  02/12/2020 10:12 AM    [place]

## 2020-02-12 ENCOUNTER — Encounter: Payer: Self-pay | Admitting: Cardiology

## 2020-02-12 ENCOUNTER — Ambulatory Visit (INDEPENDENT_AMBULATORY_CARE_PROVIDER_SITE_OTHER): Payer: Medicare Other | Admitting: Cardiology

## 2020-02-12 ENCOUNTER — Other Ambulatory Visit: Payer: Self-pay

## 2020-02-12 VITALS — BP 136/80 | HR 104 | Ht 70.0 in | Wt 184.0 lb

## 2020-02-12 DIAGNOSIS — I480 Paroxysmal atrial fibrillation: Secondary | ICD-10-CM

## 2020-02-12 DIAGNOSIS — Z7901 Long term (current) use of anticoagulants: Secondary | ICD-10-CM

## 2020-02-12 DIAGNOSIS — I110 Hypertensive heart disease with heart failure: Secondary | ICD-10-CM | POA: Diagnosis not present

## 2020-02-12 MED ORDER — DIGOXIN 250 MCG PO TABS: 0.2500 mg | ORAL_TABLET | Freq: Every day | ORAL | 3 refills | Status: DC

## 2020-02-12 NOTE — Patient Instructions (Signed)
Medication Instructions:  Your physician has recommended you make the following change in your medication:  STOP: Cardizem START: Digoxin .25 mg take one tablet by mouth daily.  *If you need a refill on your cardiac medications before your next appointment, please call your pharmacy*   Lab Work: Your physician recommends that you return for lab work in: 1 week BMP, Digoxin level If you have labs (blood work) drawn today and your tests are completely normal, you will receive your results only by: Marland Kitchen MyChart Message (if you have MyChart) OR . A paper copy in the mail If you have any lab test that is abnormal or we need to change your treatment, we will call you to review the results.   Testing/Procedures: None   Follow-Up: At [facility], you and your health needs are our priority.  As part of our continuing mission to provide you with exceptional heart care, we have created designated Provider Care Teams.  These Care Teams include your primary Cardiologist (physician) and Advanced Practice Providers (APPs -  Physician Assistants and Nurse Practitioners) who all work together to provide you with the care you need, when you need it.  We recommend signing up for the patient portal called "MyChart".  Sign up information is provided on this After Visit Summary.  MyChart is used to connect with patients for Virtual Visits (Telemedicine).  Patients are able to view lab/test results, encounter notes, upcoming appointments, etc.  Non-urgent messages can be sent to your provider as well.   To learn more about what you can do with MyChart, go to [email or web address].    Your next appointment:   Please follow up with Dr. Curt Bears  The format for your next appointment:   In Person  Provider:   [name], MD   Other Instructions

## 2020-02-18 ENCOUNTER — Other Ambulatory Visit: Payer: Self-pay | Admitting: Cardiology

## 2020-02-19 LAB — BASIC METABOLIC PANEL
BUN/Creatinine Ratio: 15 (ref 10–24)
BUN: 16 mg/dL (ref 8–27)
CO2: 26 mmol/L (ref 20–29)
Calcium: 9.3 mg/dL (ref 8.6–10.2)
Chloride: 102 mmol/L (ref 96–106)
Creatinine, Ser: 1.05 mg/dL (ref 0.76–1.27)
GFR calc Af Amer: 79 mL/min/{1.73_m2} (ref >59)
GFR calc non Af Amer: 68 mL/min/{1.73_m2} (ref >59)
Glucose: 220 mg/dL — ABNORMAL HIGH (ref 65–99)
Potassium: 4.3 mmol/L (ref 3.5–5.2)
Sodium: 140 mmol/L (ref 134–144)

## 2020-02-19 LAB — PRO B NATRIURETIC PEPTIDE: NT-Pro BNP: 2036 pg/mL — ABNORMAL HIGH (ref 0–486)

## 2020-02-19 LAB — DIGOXIN LEVEL: Digoxin, Serum: 2.4 ng/mL (ref 0.5–0.9)

## 2020-02-22 ENCOUNTER — Telehealth: Payer: Self-pay

## 2020-02-22 DIAGNOSIS — Z79899 Other long term (current) drug therapy: Secondary | ICD-10-CM

## 2020-02-22 NOTE — Telephone Encounter (Signed)
Spoke with patient regarding results and recommendation.  Patient verbalizes understanding and is agreeable to plan of care. Advised patient to call back with any issues or concerns.  

## 2020-02-23 ENCOUNTER — Telehealth: Payer: Self-pay

## 2020-02-23 LAB — DIGOXIN LEVEL: Digoxin, Serum: 0.7 ng/mL (ref 0.5–0.9)

## 2020-02-23 MED ORDER — DIGOXIN 250 MCG PO TABS: 0.2500 mg | ORAL_TABLET | ORAL | 3 refills | Status: DC

## 2020-02-23 NOTE — Telephone Encounter (Signed)
Spoke with patient regarding results and recommendation.  Patient verbalizes understanding and is agreeable to plan of care. Advised patient to call back with any issues or concerns.  

## 2020-02-25 ENCOUNTER — Encounter: Payer: Self-pay | Admitting: Cardiology

## 2020-02-25 ENCOUNTER — Ambulatory Visit: Payer: Medicare Other | Admitting: Cardiology

## 2020-02-25 ENCOUNTER — Other Ambulatory Visit: Payer: Self-pay

## 2020-02-25 VITALS — BP 142/82 | HR 106 | Ht 70.0 in | Wt 180.6 lb

## 2020-02-25 DIAGNOSIS — I4819 Other persistent atrial fibrillation: Secondary | ICD-10-CM | POA: Diagnosis not present

## 2020-02-25 NOTE — Patient Instructions (Signed)
Medication Instructions:  Your physician recommends that you continue on your current medications as directed. Please refer to the Current Medication list given to you today.  *If you need a refill on your cardiac medications before your next appointment, please call your pharmacy*   Lab Work: None ordered   Testing/Procedures: None ordered   Follow-Up: At [facility], you and your health needs are our priority.  As part of our continuing mission to provide you with exceptional heart care, we have created designated Provider Care Teams.  These Care Teams include your primary Cardiologist (physician) and Advanced Practice Providers (APPs -  Physician Assistants and Nurse Practitioners) who all work together to provide you with the care you need, when you need it.   Your next appointment:    to be determined  The format for your next appointment:   In Person  Provider:   Allegra Lai, MD    Thank you for choosing [facility]!!   Trinidad Curet, RN [phone]   Other Instructions  Dofetilide capsules What is this medicine? DOFETILIDE (doe FET il ide) is an antiarrhythmic drug. It helps make your heart beat regularly. This medicine also helps to slow rapid heartbeats. This medicine may be used for other purposes; ask your health care provider or pharmacist if you have questions. COMMON BRAND NAME(S): Tikosyn What should I tell my health care provider before I take this medicine? They need to know if you have any of these conditions:  heart disease  history of irregular heartbeat  history of low levels of potassium or magnesium in the blood  kidney disease  liver disease  an unusual or allergic reaction to dofetilide, other medicines, foods, dyes, or preservatives  pregnant or trying to get pregnant  breast-feeding How should I use this medicine? Take this medicine by mouth with a glass of water. Follow the directions on the prescription label. Do not  take with grapefruit juice. You can take it with or without food. If it upsets your stomach, take it with food. Take your medicine at regular intervals. Do not take it more often than directed. Do not stop taking except on your doctor's advice. A special MedGuide will be given to you by the pharmacist with each prescription and refill. Be sure to read this information carefully each time. Talk to your pediatrician regarding the use of this medicine in children. Special care may be needed. Overdosage: If you think you have taken too much of this medicine contact a poison control center or emergency room at once. NOTE: This medicine is only for you. Do not share this medicine with others. What if I miss a dose? If you miss a dose, skip it. Take your next dose at the normal time. Do not take extra or 2 doses at the same time to make up for the missed dose. What may interact with this medicine? Do not take this medicine with any of the following medications:  cimetidine  cisapride  dolutegravir  dronedarone  hydrochlorothiazide  ketoconazole  megestrol  pimozide  prochlorperazine  thioridazine  trimethoprim  verapamil This medicine may also interact with the following medications:  amiloride  cannabinoids  certain antibiotics like erythromycin or clarithromycin  certain antiviral medicines for HIV or hepatitis  certain medicines for depression, anxiety, or psychotic disorders  digoxin  diltiazem  grapefruit juice  metformin  nefazodone  other medicines that prolong the QT interval (an abnormal heart rhythm)  quinine  triamterene  zafirlukast  ziprasidone This  list may not describe all possible interactions. Give your health care provider a list of all the medicines, herbs, non-prescription drugs, or dietary supplements you use. Also tell them if you smoke, drink alcohol, or use illegal drugs. Some items may interact with your medicine. What should I watch  for while using this medicine? Your condition will be monitored carefully while you are receiving this medicine. What side effects may I notice from receiving this medicine? Side effects that you should report to your doctor or health care professional as soon as possible:  allergic reactions like skin rash, itching or hives, swelling of the face, lips, or tongue  breathing problems  chest pain or chest tightness  dizziness  signs and symptoms of a dangerous change in heartbeat or heart rhythm like chest pain; dizziness; fast or irregular heartbeat; palpitations; feeling faint or lightheaded, falls; breathing problems  signs and symptoms of electrolyte imbalance like severe diarrhea, unusual sweating, vomiting, loss of appetite, increased thirst  swelling of the ankles, legs, or feet  tingling, numbness in the hands or feet Side effects that usually do not require medical attention (report to your doctor or health care professional if they continue or are bothersome):  diarrhea  general ill feeling or flu-like symptoms  headache  nausea  trouble sleeping  stomach pain This list may not describe all possible side effects. Call your doctor for medical advice about side effects. You may report side effects to FDA at 1-800-FDA-1088. Where should I keep my medicine? Keep out of the reach of children. Store at room temperature between 15 and 30 degrees C (59 and 86 degrees F). Throw away any unused medicine after the expiration date. NOTE: This sheet is a summary. It may not cover all possible information. If you have questions about this medicine, talk to your doctor, pharmacist, or health care provider.  2020 Elsevier/Gold Standard (2018-02-03 10:18:48)     Preparing for Tikosyn (Dofetilide) Admission  1) Check with insurance company for cost of drug to ensure affordability.  Dofetilide 500 mcg twice a day  2) Tikosyn admission requires a 3 day hospital stay with constant  telemetry monitoring. You will have and EKG after each dose of Tikosyn.  If the drug does not convert you to normal rhythm a cardioversion will be performed prior to discharge.  3) A pharmacist will review all of your medications.  If there are any interactions noted we will be in touch with you.  4) Please ensure no missed doses of your anticoagulation (blood thinner) for the past 3 weeks prior to admission.  5) No Benadryl is allowed 3 days prior to admission.  6) On day of admission - you will come in for office visit in the [facility] where lab work will be drawn.  IF the labs are acceptable an inpatient bed will be requested.  7) Normally admission to a bed is straight from our office.  This is all dependent on bed availability.  In some instances you may be sent home and bed placement will call later in the day when a bed is available.  8) You may bring personal belongings/clothing with you to the hospital.  Please leave your suitcase in the care until you arrive in admissions.  Questions - please call the office at [phone]

## 2020-02-25 NOTE — Progress Notes (Signed)
Electrophysiology Office Note   Date:  02/25/2020   ID:  Charles Sapp., DOB 02/12/1941, MRN [id number]  PCP:  Charles Lloyd, Charles Halsted, MD  Cardiologist:  Charles Lloyd Primary Electrophysiologist:  Charles Pagett Meredith Leeds, MD    Chief Complaint: atrial fibrillation initial   History of Present Illness: Charles [place]. is a 79 y.o. male who is being seen today for the evaluation of atrial fibrillation at the request of Charles Lloyd. Presenting today for electrophysiology evaluation.  He has a history of atrial fibrillation, moderate systolic heart failure, diabetes, hypertension, hyperlipidemia.  He had an ablation at [facility] in 2015 with cardioversion in 2017 and repeat cardioversion in 2019.  He had a repeat ablation 03/12/2019.  Unfortunately he went back into atrial fibrillation a few weeks ago.  He has weakness and fatigue.  He was started on digoxin, but became toxic and is now taking it 3 times a week.  Today, denies symptoms of palpitations, chest pain, shortness of breath, orthopnea, PND, lower extremity edema, claudication, dizziness, presyncope, syncope, bleeding, or neurologic sequela. The patient is tolerating medications without difficulties.  His main symptoms are weakness and fatigue as above.  He does not get much in the way of shortness of breath.  He is able do most of his daily activities.  Past Medical History:  Diagnosis Date  . Acute tracheobronchitis 01/05/2019  . B12 deficiency   . 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 Cancer ([place])   . Cardiomyopathy ([place]) 03/10/2018   With chronic atrial fibrillation  . CHF (congestive heart failure) ([place]) 04/16/2013   Functional class II, ejection fraction 35-40%  Formatting of this note might be different from the original. Functional class II, ejection fraction 35-40%  . Chronic anticoagulation   . Chronic diastolic (congestive) heart failure (HCC) 04/16/2013   Functional class II, ejection fraction  35-40%  Last Assessment & Plan:  Clinically stable Volume well controlled meds reviewed  . Chronic diastolic CHF (congestive heart failure) ([place]) 04/16/2013   Functional class II, ejection fraction 35-40%  Last Assessment & Plan:  Clinically stable Volume well controlled meds reviewed  . Colon polyps 04/16/2013  . Diabetic neuropathy ([place])   . Eosinophil count raised 02/17/2019  . Essential hypertension 01/06/2010   Last Assessment & Plan:  Well controlled Continue med management  . 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  Steroid usage was discontinued several months ago.  Follow up with Dermatology as planned.  . Hypercholesterolemia 01/06/2010   Last Assessment & Plan:  Repeat labs recommended  . Hyperlipidemia associated with type 2 diabetes mellitus ([place]) 01/28/2018  . Hypertensive heart disease with heart failure ([place]) 01/06/2010   Last Assessment & Plan:  Well controlled Continue med management  . Hyponatremia 12/17/2018  . 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 Infection of prosthetic right knee joint ([place])   . 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 Lesion of liver 04/20/2019   -On cardiac CT 02/2019  . Mild reactive airways disease   . 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 Neoplasm of prostate, malignant ([place]) 01/06/2010   Charles Lloyd S/p radiation therapy   Last Assessment & Plan:  Repeat psa today No urinary sx Pt  reported fatigue/poor appetite  . On amiodarone therapy 09/07/2013  . On continuous oral anticoagulation 01/28/2018  . Other activity(E029.9) 04/20/2013   Formatting of this note might be different from the original. Transthoracic Echocardiogram-03/10/2013-[facility]: Normal left ventricular wall thickness and cavity size.   Global left ventricular systolic function is moderately reduced.  The estimated ejection fraction is 35-40%.  The left atrium is moderately enlarged.  The right atrium is mildly enlarged.  No significant valvular   . Overweight (BMI 25.0-29.9) 10/22/2016  . Persistent atrial fibrillation (HCC) 04/16/2013   Last Assessment & Plan:  Rate controlled Continue med management eliquis for stroke preventino  Formatting of this note might be different from the original.  Drug  HX Current Rx Pre-ABL inefficacy Pre-ABL intolerant Post-ABL inefficacy Post-ABL intolerant max dose/24h M/Y end comments  sotalol                  dofetilide                  flecainide                  propafenone                  am  . S/P TKR (total knee replacement), bilateral   . Secondary hypercoagulable state (HCC) 04/09/2019  . Tinea cruris 04/16/2013  . Type 2 diabetes mellitus with diabetic polyneuropathy, without long-term current use of insulin (HCC) 04/16/2013   Last Assessment & Plan:  Labs today Pt reports well controlled on ambulatory monitoring  . Vitamin D deficiency 07/25/2018   Past Surgical History:  Procedure Laterality Date  . ATRIAL FIBRILLATION ABLATION N/A 03/12/2019   Procedure: ATRIAL FIBRILLATION ABLATION;  Surgeon: Charles Lemming, MD;  Location: [facility] INVASIVE CV LAB;  Service: Cardiovascular;  Laterality: N/A;  . CARDIAC ELECTROPHYSIOLOGY STUDY AND ABLATION    . CARDIOVERSION    . CARDIOVERSION N/A 10/10/2018   Procedure: CARDIOVERSION;  Surgeon: Charles Stade, MD;  Location: [facility] ENDOSCOPY;  Service: Cardiovascular;  Laterality: N/A;  . CHOLECYSTECTOMY    . PROSTATECTOMY    . REPLACEMENT TOTAL KNEE BILATERAL       Current Outpatient Medications  Medication Sig Dispense Refill  . atorvastatin (LIPITOR) 20 MG tablet TAKE 1 TABLET DAILY AT 6 P.M. 90 tablet 0  . digoxin (LANOXIN) 0.25 MG tablet Take 1 tablet (0.25 mg total) by mouth 3 (three) times a week. 90 tablet 3  . ELIQUIS 5 MG TABS tablet TAKE  1 TABLET TWICE A DAY 180 tablet 3  . fluticasone (FLONASE) 50 MCG/ACT nasal spray Place 2 sprays into both nostrils daily. 16 g 2  . fluticasone furoate-vilanterol (BREO ELLIPTA) 100-25 MCG/INH AEPB Inhale 1 puff into the lungs daily. 60 each 5  . gabapentin (NEURONTIN) 300 MG capsule TAKE 2 CAPSULES AT BEDTIME 180 capsule 3  . glucose blood ([employer] TEST STRIPS) test strip Test 2x dailyDx: type 2 diabetes, uncontrolledfluctuating blood sugars 100 each 0  . glucose blood test strip Use to test blood sugars once daily as directed Dx:E11.65    . Magnesium 400 MG CAPS Take 400 mg by mouth in the morning and at bedtime. 28 capsule 0  . metFORMIN (GLUCOPHAGE) 500 MG tablet TAKE 1 TABLET TWICE A DAY WITH MEALS 180 tablet 1  . metoprolol tartrate (LOPRESSOR) 50 MG tablet TAKE 2 TABLETS (100 MG TOTAL) BY MOUTH 2 (TWO) TIMES DAILY. 360 tablet 1  . montelukast (SINGULAIR) 10 MG tablet  Take 10 mg by mouth at bedtime.     . potassium chloride SA (KLOR-CON M20) 20 MEQ tablet Take 1 tablet (20 mEq total) by mouth daily. 90 tablet 3  . RESTASIS 0.05 % ophthalmic emulsion 1 drop 2 (two) times daily.    Marland Kitchen torsemide (DEMADEX) 20 MG tablet Take 20 mg by mouth daily.     No current facility-administered medications for this visit.    Allergies:   Patient has no known allergies.   Social History:  The patient  reports that he has never smoked. He has never used smokeless tobacco. He reports current alcohol use of about 2.0 standard drinks of alcohol per week. He reports that he does not use drugs.   Family History:  The patient's family history includes Cancer in his father and mother; Depression in his father and mother; Early death in his father and mother.    ROS:  Please see the history of present illness.   Otherwise, review of systems is positive for none.   All other systems are reviewed and negative.   PHYSICAL EXAM: VS:  BP (!) 142/82   Pulse (!) 106   Ht 5\' 10"  (1.778 m)   Wt 180 lb 9.6 oz (81.9  kg)   SpO2 98%   BMI 25.91 kg/m  , BMI Body mass index is 25.91 kg/m. GEN: Well nourished, well developed, in no acute distress  HEENT: normal  Neck: no JVD, carotid bruits, or masses Cardiac: irregular; no murmurs, rubs, or gallops,no edema  Respiratory:  clear to auscultation bilaterally, normal work of breathing GI: soft, nontender, nondistended, + BS MS: no deformity or atrophy  Skin: warm and dry Neuro:  Strength and sensation are intact Psych: euthymic mood, full affect  EKG:  EKG is ordered today. Personal review of the ekg ordered shows atrial fibrillation, rate 106   Recent Labs: 12/18/2019: Hemoglobin 12.0; Platelets 189 01/29/2020: Magnesium 1.5; TSH 0.912 02/18/2020: BUN 16; Creatinine, Ser 1.05; NT-Pro BNP 2,036; Potassium 4.3; Sodium 140    Lipid Panel     Component Value Date/Time   CHOL 135 04/21/2019 0940   TRIG 81 04/21/2019 0940   HDL 41 04/21/2019 0940   CHOLHDL 3.3 04/21/2019 0940   CHOLHDL 3 07/24/2018 1058   VLDL 25.0 07/24/2018 1058   LDLCALC 78 04/21/2019 0940     Wt Readings from Last 3 Encounters:  02/25/20 180 lb 9.6 oz (81.9 kg)  02/12/20 184 lb (83.5 kg)  01/29/20 177 lb (80.3 kg)      Other studies Reviewed: Additional studies/ records that were reviewed today include: TTE 10/07/18  Review of the above records today demonstrates:   1. The left ventricle has low normal systolic function, with an ejection fraction of 50-55%. The cavity size was normal. Left ventricular diastolic function could not be evaluated secondary to atrial fibrillation. No evidence of left ventricular  regional wall motion abnormalities.  2. The right ventricle has normal systolic function. The cavity was normal. There is no increase in right ventricular wall thickness. Right ventricular systolic pressure is normal.  3. There is mild mitral annular calcification present.  4. The aortic valve is tricuspid. Moderate sclerosis of the aortic valve. Aortic valve  regurgitation was not assessed by color flow Doppler.  5. The aorta is normal in size and structure.  6. There is right bowing of the interatrial septum, suggestive of elevated left atrial pressure.  Myoview 05/25/19  Nuclear stress EF: 60%.  No T wave inversion was  noted during stress.  There was no ST segment deviation noted during stress.  This is a low risk study.   Normal perfusion. LVEF 60% with normal wall motion. This is a low risk study. No prior for comparison.  ASSESSMENT AND PLAN:  1.  Persistent atrial fibrillation: Currently on Eliquis.  Status post ablation 03/02/2019.  CHA2DS2-VASc of 4.  Unfortunately has gone back into atrial fibrillation.  He has weakness and fatigue as his symptoms.  He would like to get back into normal rhythm.  I discussed with him the options of repeat ablation versus dofetilide load.  He Charles Lloyd discuss this further with his son.  2.  Chronic systolic heart failure: Ejection fraction is fortunately improved.  No obvious volume overload.  3.  Hypertension: Currently well controlled  4.  Hyperlipidemia: Continue statin per primary cardiology    Current medicines are reviewed at length with the patient today.   The patient does not have concerns regarding his medicines.  The following changes were made today: None  Labs/ tests ordered today include:  Orders Placed This Encounter  Procedures  . EKG 12-Lead    Disposition:   FU with Charles Lloyd 3 months  Signed, Aadil Sur Meredith Leeds, MD  02/25/2020 4:01 PM     [address] [place] [address] [phone] (office) [phone] (fax)

## 2020-03-01 ENCOUNTER — Telehealth: Payer: Self-pay | Admitting: Cardiology

## 2020-03-01 NOTE — Telephone Encounter (Signed)
Patient calling to schedule ablation.

## 2020-03-01 NOTE — Telephone Encounter (Signed)
My chart message sent regarding scheduling ablation.

## 2020-03-03 NOTE — Telephone Encounter (Signed)
Follow up:     Patient returning a call back concerning making a ablation apt. Please call patient back.

## 2020-03-04 NOTE — Telephone Encounter (Signed)
Pt would like ablation scheduled for 3/4. Aware I will hold date and be in touch at later time to go over instructions, etc. Pt agreeable to plan.

## 2020-03-09 ENCOUNTER — Other Ambulatory Visit: Payer: Self-pay | Admitting: Cardiology

## 2020-03-10 ENCOUNTER — Telehealth: Payer: Self-pay | Admitting: *Deleted

## 2020-03-10 ENCOUNTER — Other Ambulatory Visit: Payer: Self-pay | Admitting: Internal Medicine

## 2020-03-10 DIAGNOSIS — E559 Vitamin D deficiency, unspecified: Secondary | ICD-10-CM

## 2020-03-10 LAB — BASIC METABOLIC PANEL
BUN/Creatinine Ratio: 13 (ref 10–24)
BUN: 14 mg/dL (ref 8–27)
CO2: 22 mmol/L (ref 20–29)
Calcium: 9.6 mg/dL (ref 8.6–10.2)
Chloride: 105 mmol/L (ref 96–106)
Creatinine, Ser: 1.06 mg/dL (ref 0.76–1.27)
GFR calc Af Amer: 78 mL/min/{1.73_m2} (ref >59)
GFR calc non Af Amer: 67 mL/min/{1.73_m2} (ref >59)
Glucose: 246 mg/dL — ABNORMAL HIGH (ref 65–99)
Potassium: 4.4 mmol/L (ref 3.5–5.2)
Sodium: 141 mmol/L (ref 134–144)

## 2020-03-10 LAB — DIGOXIN LEVEL: Digoxin, Serum: 2.1 ng/mL (ref 0.5–0.9)

## 2020-03-10 LAB — PRO B NATRIURETIC PEPTIDE: NT-Pro BNP: 2820 pg/mL — ABNORMAL HIGH (ref 0–486)

## 2020-03-10 NOTE — Telephone Encounter (Signed)
-----   Message from Richardo Priest, MD sent at 03/10/2020  3:38 PM EST ----- I am surprised his digoxin level is elevated again I think we should just stop it and not resume in the future.  Otherwise good labs.

## 2020-03-10 NOTE — Telephone Encounter (Signed)
The patient has been notified of the result and verbalized understanding.  All questions (if any) were answered. He is aware to stop the digoxin.  Med list updated.

## 2020-03-18 ENCOUNTER — Encounter: Payer: Self-pay | Admitting: *Deleted

## 2020-03-21 ENCOUNTER — Other Ambulatory Visit: Payer: Self-pay | Admitting: Internal Medicine

## 2020-03-21 DIAGNOSIS — E1142 Type 2 diabetes mellitus with diabetic polyneuropathy: Secondary | ICD-10-CM

## 2020-03-25 ENCOUNTER — Encounter: Payer: Self-pay | Admitting: Internal Medicine

## 2020-03-25 ENCOUNTER — Ambulatory Visit: Payer: Medicare Other | Admitting: Internal Medicine

## 2020-03-25 ENCOUNTER — Other Ambulatory Visit: Payer: Self-pay

## 2020-03-25 VITALS — BP 140/85 | HR 81 | Temp 98.2°F | Wt 187.6 lb

## 2020-03-25 DIAGNOSIS — Z23 Encounter for immunization: Secondary | ICD-10-CM

## 2020-03-25 DIAGNOSIS — I480 Paroxysmal atrial fibrillation: Secondary | ICD-10-CM

## 2020-03-25 DIAGNOSIS — E1142 Type 2 diabetes mellitus with diabetic polyneuropathy: Secondary | ICD-10-CM | POA: Diagnosis not present

## 2020-03-25 DIAGNOSIS — Z7901 Long term (current) use of anticoagulants: Secondary | ICD-10-CM

## 2020-03-25 LAB — POCT GLYCOSYLATED HEMOGLOBIN (HGB A1C): Hemoglobin A1C: 6.6 % — AB (ref 4.0–5.6)

## 2020-03-25 MED ORDER — ACCU-CHEK AVIVA PLUS VI STRP: 1.0000 | ORAL_STRIP | Freq: Every day | 12 refills | Status: DC

## 2020-03-25 NOTE — Progress Notes (Signed)
Established Patient Office Visit     This visit occurred during the SARS-CoV-2 public health emergency.  Safety protocols were in place, including screening questions prior to the visit, additional usage of staff PPE, and extensive cleaning of exam room while observing appropriate contact time as indicated for disinfecting solutions.    CC/Reason for Visit: 57-month follow-up chronic medical conditions  HPI: Charles Lloyd. is a 80 y.o. male who is coming in today for the above mentioned reasons. Past Medical History is significant for: Atrial fibrillation, chronic diastolic heart failure who is stable and has been seen by cardiology locally. Also has a history of well-controlled type 2 diabetes on metformin, hyperlipidemia on Lipitor, prior history of prostate cancer status post TURP, prior history of B12 deficiency on monthly supplementation.  When I last saw him there was some suspicion for polymyalgia rheumatica and he was started on low-dose steroids.  After about a month his symptoms completely resolved.  By the time he followed up with rheumatology he was symptom-free, he is no longer on prednisone and was asked to follow-up with them as needed.  Unfortunately he has gone back into atrial fibrillation and has had a lot of difficulty tolerating it.  He is scheduled for another ablation procedure next month.  He is here mainly for diabetes follow-up.  He is requesting refills of his test strips as well as flu shot.  He has had all of his Covid vaccinations.   Past Medical/Surgical History: Past Medical History:  Diagnosis Date  . Acute tracheobronchitis 01/05/2019  . B12 deficiency   . 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 Cancer ([place])   . Cardiomyopathy ([place]) 03/10/2018   With chronic atrial fibrillation  . CHF (congestive heart failure) ([place]) 04/16/2013   Functional class II, ejection fraction 35-40%  Formatting of this note might be different from the  original. Functional class II, ejection fraction 35-40%  . Chronic anticoagulation   . Chronic diastolic (congestive) heart failure (HCC) 04/16/2013   Functional class II, ejection fraction 35-40%  Last Assessment & Plan:  Clinically stable Volume well controlled meds reviewed  . Chronic diastolic CHF (congestive heart failure) ([place]) 04/16/2013   Functional class II, ejection fraction 35-40%  Last Assessment & Plan:  Clinically stable Volume well controlled meds reviewed  . Colon polyps 04/16/2013  . Diabetic neuropathy ([place])   . Eosinophil count raised 02/17/2019  . Essential hypertension 01/06/2010   Last Assessment & Plan:  Well controlled Continue med management  . 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  Steroid usage was discontinued several months ago.  Follow up with Dermatology as planned.  . Hypercholesterolemia 01/06/2010   Last Assessment & Plan:  Repeat labs recommended  . Hyperlipidemia associated with type 2 diabetes mellitus ([place]) 01/28/2018  . Hypertensive heart disease with heart failure ([place]) 01/06/2010   Last Assessment & Plan:  Well controlled Continue med management  . Hyponatremia 12/17/2018  . 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 Infection of prosthetic right knee joint ([place])   . 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 Lesion of liver 04/20/2019   -On cardiac CT 02/2019  . Mild reactive airways disease   .  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 On amiodarone therapy 09/07/2013  . On continuous oral anticoagulation 01/28/2018  . Other activity(E029.9)  04/20/2013   Formatting of this note might be different from the original. Transthoracic Echocardiogram-[facility]/10/[facility]: Normal left ventricular wall thickness and cavity size.  Global left ventricular systolic function is moderately reduced.  The estimated ejection fraction is 35-40%.  The left atrium is moderately enlarged.  The right atrium is mildly enlarged.  No significant valvular   . Overweight (BMI 25.0-29.9) 10/22/2016  . Persistent atrial fibrillation ([place]) 04/16/2013   Last Assessment & Plan:  Rate controlled Continue med management eliquis for stroke preventino  Formatting of this note might be different from the original.  Drug  HX Current Rx Pre-ABL inefficacy Pre-ABL intolerant Post-ABL inefficacy Post-ABL intolerant max dose/24h M/Y end comments  sotalol                  dofetilide                  flecainide                  propafenone                  am  . S/P TKR (total knee replacement), bilateral   . Secondary hypercoagulable state ([place]) 04/09/2019  . Tinea cruris 04/16/2013  . Type 2 diabetes mellitus with diabetic polyneuropathy, without long-term current use of insulin ([place]) 04/16/2013   Last Assessment & Plan:  Labs today Pt reports well controlled on ambulatory monitoring  . Vitamin D deficiency 07/25/2018    Past Surgical History:  Procedure Laterality Date  . ATRIAL FIBRILLATION ABLATION N/A 03/12/2019   Procedure: ATRIAL FIBRILLATION ABLATION;  Surgeon: [name], MD;  Location: [facility] CV LAB;  Service: Cardiovascular;  Laterality: N/A;  . CARDIAC ELECTROPHYSIOLOGY STUDY AND ABLATION    . CARDIOVERSION    . CARDIOVERSION N/A 10/10/2018   Procedure: CARDIOVERSION;  Surgeon: [name], MD;  Location[facility] ENDOSCOPY;  Service: Cardiovascular;  Laterality: N/A;  . CHOLECYSTECTOMY    . PROSTATECTOMY    . REPLACEMENT TOTAL KNEE BILATERAL      Social History:  reports that he has never smoked. He has never used smokeless tobacco. He  reports current alcohol use of about 2.0 standard drinks of alcohol per week. He reports that he does not use drugs.  Allergies: No Known Allergies  Family History:  Family History  Problem Relation Age of Onset  . Cancer Mother   . Depression Mother   . Early death Mother   . Cancer Father   . Depression Father   . Early death Father      Current Outpatient Medications:  .  atorvastatin (LIPITOR) 20 MG tablet, TAKE 1 TABLET DAILY AT 6 P.M., Disp: 90 tablet, Rfl: 3 .  ELIQUIS 5 MG TABS tablet, TAKE 1 TABLET TWICE A DAY, Disp: 180 tablet, Rfl: 3 .  fluticasone (FLONASE) 50 MCG/ACT nasal spray, Place 2 sprays into both nostrils daily., Disp: 16 g, Rfl: 2 .  fluticasone furoate-vilanterol (BREO ELLIPTA) 100-25 MCG/INH AEPB, Inhale 1 puff into the lungs daily., Disp: 60 each, Rfl: 5 .  gabapentin (NEURONTIN) 300 MG capsule, TAKE 2 CAPSULES AT BEDTIME, Disp: 180 capsule, Rfl: 3 .  glucose blood ([employer] TEST STRIPS) test strip, Test 2x dailyDx: type 2 diabetes, uncontrolledfluctuating blood sugars, Disp: 100 each, Rfl: 0 .  glucose blood test strip, Use to test blood sugars once daily as directed Dx:E11.65, Disp: , Rfl:  .  Magnesium 400 MG CAPS, Take 400 mg by mouth in the morning and at bedtime., Disp: 28 capsule, Rfl: 0 .  metFORMIN (GLUCOPHAGE) 500 MG tablet, TAKE 1 TABLET TWICE A DAY WITH MEALS, Disp: 180 tablet, Rfl: 3 .  metoprolol tartrate (LOPRESSOR) 50 MG tablet, TAKE 2 TABLETS (100 MG TOTAL) BY MOUTH 2 (TWO) TIMES DAILY., Disp: 360 tablet, Rfl: 1 .  montelukast (SINGULAIR) 10 MG tablet, Take 10 mg by mouth at bedtime. , Disp: , Rfl:  .  potassium chloride SA (KLOR-CON M20) 20 MEQ tablet, Take 1 tablet (20 mEq total) by mouth daily., Disp: 90 tablet, Rfl: 3 .  RESTASIS 0.05 % ophthalmic emulsion, 1 drop 2 (two) times daily., Disp: , Rfl:  .  torsemide (DEMADEX) 20 MG tablet, Take 20 mg by mouth daily., Disp: , Rfl:   Review of Systems:  Constitutional: Denies fever, chills,  diaphoresis, appetite change and fatigue.  HEENT: Denies photophobia, eye pain, redness, hearing loss, ear pain, congestion, sore throat, rhinorrhea, sneezing, mouth sores, trouble swallowing, neck pain, neck stiffness and tinnitus.   Respiratory: Denies SOB, DOE, cough, chest tightness,  and wheezing.   Cardiovascular: Denies chest pain. Gastrointestinal: Denies nausea, vomiting, abdominal pain, diarrhea, constipation, blood in stool and abdominal distention.  Genitourinary: Denies dysuria, urgency, frequency, hematuria, flank pain and difficulty urinating.  Endocrine: Denies: hot or cold intolerance, sweats, changes in hair or nails, polyuria, polydipsia. Musculoskeletal: Denies myalgias, back pain, joint swelling, arthralgias and gait problem.  Skin: Denies pallor, rash and wound.  Neurological: Denies dizziness, seizures, syncope, weakness, light-headedness, numbness and headaches.  Hematological: Denies adenopathy. Easy bruising, personal or family bleeding history  Psychiatric/Behavioral: Denies suicidal ideation, mood changes, confusion, nervousness, sleep disturbance and agitation    Physical Exam: Vitals:   03/25/20 1427  BP: 140/85  Pulse: 81  Temp: 98.2 F (36.8 C)  TempSrc: Oral  SpO2: 99%  Weight: 187 lb 9.6 oz (85.1 kg)    Body mass index is 26.92 kg/m.   Constitutional: NAD, calm, comfortable Eyes: PERRL, lids and conjunctivae normal ENMT: Mucous membranes are moist.  Respiratory: clear to auscultation bilaterally, no wheezing, no crackles. Normal respiratory effort. No accessory muscle use.  Cardiovascular: Regular rate and rhythm, no murmurs / rubs / gallops. No extremity edema. Neurologic: Grossly intact and nonfocal Psychiatric: Normal judgment and insight. Alert and oriented x 3. Normal mood.    Impression and Plan:  Type 2 diabetes mellitus with diabetic polyneuropathy, without long-term current use of insulin ([place])  -Well-controlled with an A1c of 6.6  today.  Need for influenza vaccination - Plan: Flu Vaccine QUAD High Dose(Fluad)  Paroxysmal atrial fibrillation (HCC) Chronic anticoagulation -Scheduled for ablation procedure with Dr. Curt Bears next month.  He is anticoagulated on Eliquis, he is also rate controlled on metoprolol.    Lelon Frohlich, MD [facility] at [facility]

## 2020-03-29 ENCOUNTER — Telehealth: Payer: Self-pay | Admitting: Cardiology

## 2020-03-29 MED ORDER — DILTIAZEM HCL 30 MG PO TABS: 30.0000 mg | ORAL_TABLET | Freq: Four times a day (QID) | ORAL | 3 refills | Status: DC | PRN

## 2020-03-29 NOTE — Telephone Encounter (Signed)
Spoke to the patient just now and he let me know that when he took Cardizem previously he had diarrhea from it. He said that he is willing to try it since this time it is just PRN and he will let us know if he has any issues.    Encouraged patient to call back with any questions or concerns.

## 2020-03-29 NOTE — Telephone Encounter (Signed)
STAT if HR is under 50 or over 120 (normal HR is 60-100 beats per minute)  1) What is your heart rate? 145  2) Do you have a log of your heart rate readings (document readings)? No   3) Do you have any other symptoms? No  Wants to discuss being put on medication for this since stopping digoxin (LANOXIN) 0.25 MG tablet [[id number]]. Please advise.

## 2020-03-29 NOTE — Telephone Encounter (Signed)
    Pt said found out he can't take cardizem as well. He would like to speak with Lilia Pro again

## 2020-03-29 NOTE — Telephone Encounter (Signed)
I would add Cardizem 30 mg he can take it every 6 hours for heart rates greater and 110 bpm along with his beta-blocker.  He has seen Dr. Curt Bears and is scheduled for an atrial fibrillation ablation

## 2020-03-29 NOTE — Telephone Encounter (Signed)
Spoke to the patient just now and let him know Dr. Munley's recommendations. He verbalizes understanding and thanks me for the call back.  

## 2020-03-30 ENCOUNTER — Telehealth: Payer: Self-pay | Admitting: Cardiology

## 2020-03-30 NOTE — Telephone Encounter (Signed)
New message:    Patient calling stating that Dr. Bettina Gavia put him on some new medications and it is making him shake and is not  feel to well. Right now he is in AFIB Please call.

## 2020-03-30 NOTE — Telephone Encounter (Signed)
Spoke to the patient just now and he let me know that he was trying to get in touch with Trinidad Curet, RN. When asking him what was going on he requests to speak with her. I will route to her now.

## 2020-03-31 NOTE — Telephone Encounter (Signed)
Returned pt call. Pt just wanted [place] to know of recent increase in AFib/HRs.  Reports that Dr. Bettina Gavia started him on Diltiazem 30 mg PRN and that seems to be helping. Advised to let us know if issues return/worsen so that we can advise until such time ablation can be performed on 3/4. Patient verbalized understanding and agreeable to plan.

## 2020-04-15 ENCOUNTER — Telehealth: Payer: Self-pay | Admitting: Cardiology

## 2020-04-15 DIAGNOSIS — I4819 Other persistent atrial fibrillation: Secondary | ICD-10-CM

## 2020-04-15 DIAGNOSIS — Z01812 Encounter for preprocedural laboratory examination: Secondary | ICD-10-CM

## 2020-04-15 NOTE — Telephone Encounter (Signed)
   Pt would like to speak with Sherri, he said Sherri told him he needs to get a CT scan prior his procedure. No order on file, and he also said he will have a wedding to attend and he doesn't know when he needs to do the CT scan

## 2020-04-15 NOTE — Telephone Encounter (Signed)
Reviewed instructions, briefly, with pt. Aware I will send via Mychart and to review and let me know if he has any questions. Aware office will call to arrange pre ablation CT. Pt will stop by the [facility] office by 2/23 for labs. Aware office will call to arrange post ablation follow up. Patient verbalized understanding and agreeable to plan.   Pt reports HRs avg 100-120s at home. Aware I will discuss medication options with Dr. Curt Bears to gain better HR control.  Pt aware I will follow up by next week w/ advisement.

## 2020-04-19 LAB — CBC
Hematocrit: 31.6 % — ABNORMAL LOW (ref 37.5–51.0)
Hemoglobin: 9.9 g/dL — ABNORMAL LOW (ref 13.0–17.7)
MCH: 26.4 pg — ABNORMAL LOW (ref 26.6–33.0)
MCHC: 31.3 g/dL — ABNORMAL LOW (ref 31.5–35.7)
MCV: 84 fL (ref 79–97)
Platelets: 249 10*3/uL (ref 150–450)
RBC: 3.75 x10E6/uL — ABNORMAL LOW (ref 4.14–5.80)
RDW: 14.3 % (ref 11.6–15.4)
WBC: 5.2 10*3/uL (ref 3.4–10.8)

## 2020-04-19 LAB — BASIC METABOLIC PANEL
BUN/Creatinine Ratio: 14 (ref 10–24)
BUN: 16 mg/dL (ref 8–27)
CO2: 18 mmol/L — ABNORMAL LOW (ref 20–29)
Calcium: 9 mg/dL (ref 8.6–10.2)
Chloride: 103 mmol/L (ref 96–106)
Creatinine, Ser: 1.12 mg/dL (ref 0.76–1.27)
GFR calc Af Amer: 73 mL/min/{1.73_m2} (ref >59)
GFR calc non Af Amer: 63 mL/min/{1.73_m2} (ref >59)
Glucose: 183 mg/dL — ABNORMAL HIGH (ref 65–99)
Potassium: 4.3 mmol/L (ref 3.5–5.2)
Sodium: 141 mmol/L (ref 134–144)

## 2020-04-20 ENCOUNTER — Telehealth (HOSPITAL_COMMUNITY): Payer: Self-pay | Admitting: Emergency Medicine

## 2020-04-20 NOTE — Telephone Encounter (Signed)
Reaching out to patient to offer assistance regarding upcoming cardiac imaging study; pt verbalizes understanding of appt date/time, parking situation and where to check in, pre-test NPO status and medications ordered, and verified current allergies; name and call back number provided for further questions should they arise Marchia Bond RN Navigator Cardiac Imaging [facility] [phone] office [phone] cell  Holding torsemide, flonase Clarise Cruz

## 2020-04-21 ENCOUNTER — Telehealth: Payer: Self-pay | Admitting: *Deleted

## 2020-04-21 NOTE — Telephone Encounter (Signed)
Pt reports HRs  stay elevated at home with activity. Reports that Diltiazem 30 mg q6h PRN is helping, and he is taking q6h.   HRs avg 80s with Diltiazem on board. Advised to take an extra 50 mg of Lopressor in the morning, 2 hours prior to CT, if HR greater than 80. Patient verbalized understanding and agreeable to plan.

## 2020-04-22 ENCOUNTER — Ambulatory Visit (HOSPITAL_COMMUNITY)
Admission: RE | Admit: 2020-04-22 | Discharge: 2020-04-22 | Disposition: A | Discharge status: Home / Self Care | Payer: Medicare Other | Source: Ambulatory Visit | Attending: Cardiology | Admitting: Cardiology

## 2020-04-22 ENCOUNTER — Other Ambulatory Visit: Payer: Self-pay

## 2020-04-22 DIAGNOSIS — I4819 Other persistent atrial fibrillation: Secondary | ICD-10-CM

## 2020-04-22 IMAGING — CT CT HEART MORPH/PULM VEIN W/ CM & W/O CA SCORE
2 of 8 series · 10 of 20 positions shown, 12 images · non-contrast
Comparison: [DATE]
COMPARISON: [DATE]

Addendum:
EXAM:
OVER-READ INTERPRETATION  CT CHEST

The following report is an over-read performed by radiologist Dr.
LESTERSITO [REDACTED] on [DATE]. This over-read
does not include interpretation of cardiac or coronary anatomy or
pathology. The coronary CTA interpretation by the cardiologist is
attached.
CLINICAL DATA: Atrial fibrillation
Cardiac CTA
MEDICATIONS:
No medications.
TECHNIQUE: The patient was scanned on a Siemens [REDACTED]ice scanner. Gantry
rotation speed was 250 msecs. Collimation was 0.6 mm. A 100 kV
prospective scan was triggered in the ascending thoracic aorta at
35-75% of the R-R interval. Average HR during the scan was 70 bpm.
The 3D data set was interpreted on a dedicated work station using
MPR, MIP and VRT modes. A total of 80cc of contrast was used.

[Series 9: 0-95% · axial · 0.39mm/px · z∈[+1247,+1346]mm · 5 of 2960 slices shown, 7 images]
[im 494/2960  vessel]
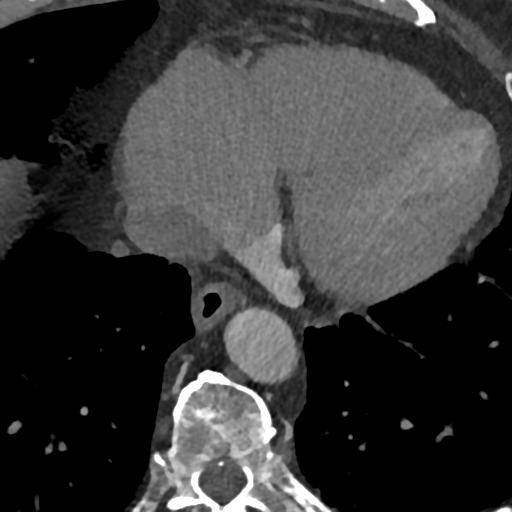
[im 494/2960  lung]
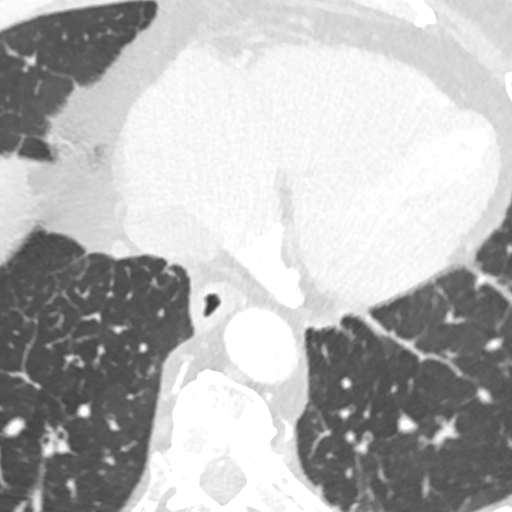
[im 987/2960  vessel]
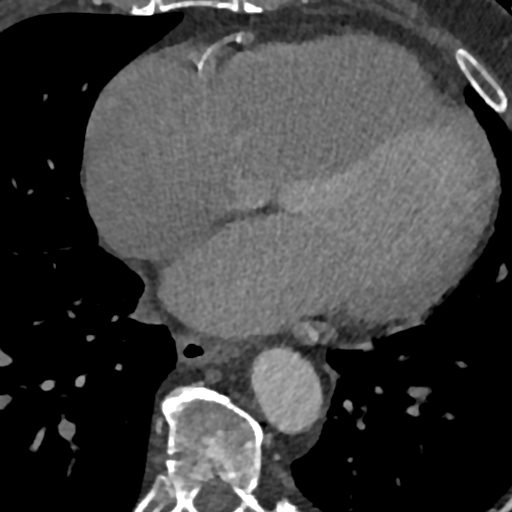
[im 1480/2960  vessel]
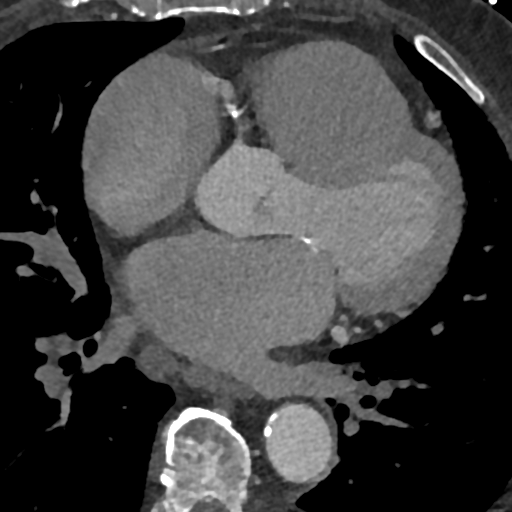
[im 1973/2960  vessel]
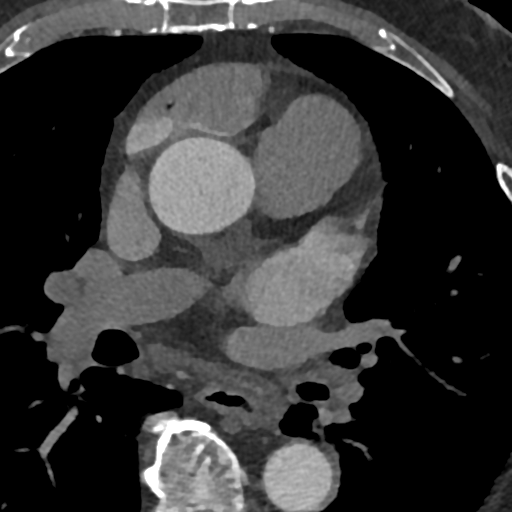
[im 2466/2960  vessel]
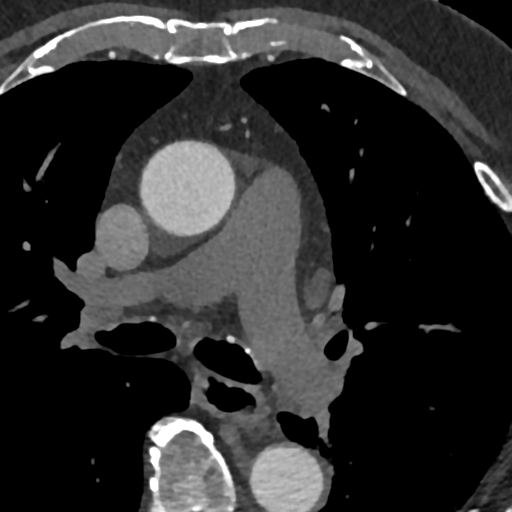
[im 2466/2960  lung]
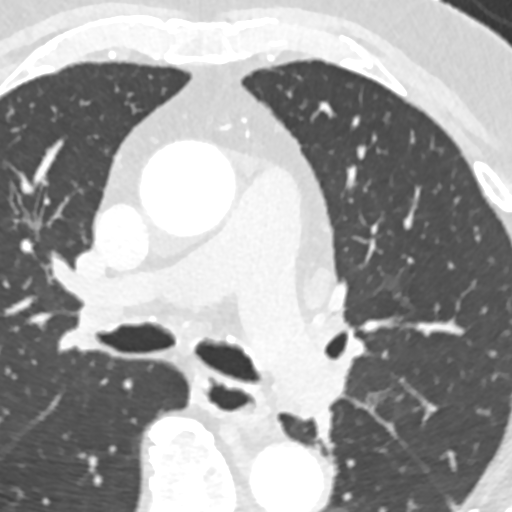

[Series 14: 5-95% · axial · 0.39mm/px · z∈[+1247,+1346]mm · 5 of 2960 slices shown]
[im 494/2960  vessel]
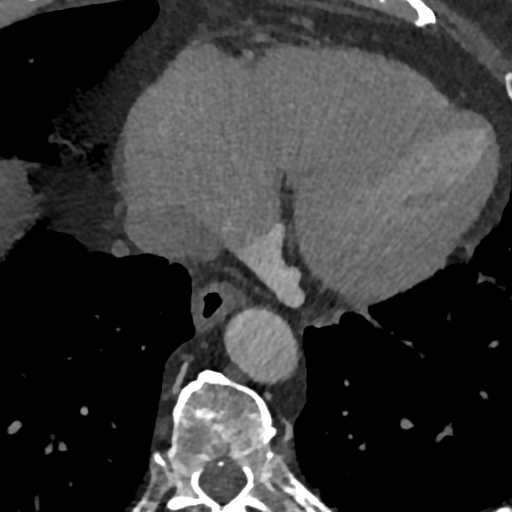
[im 987/2960  vessel]
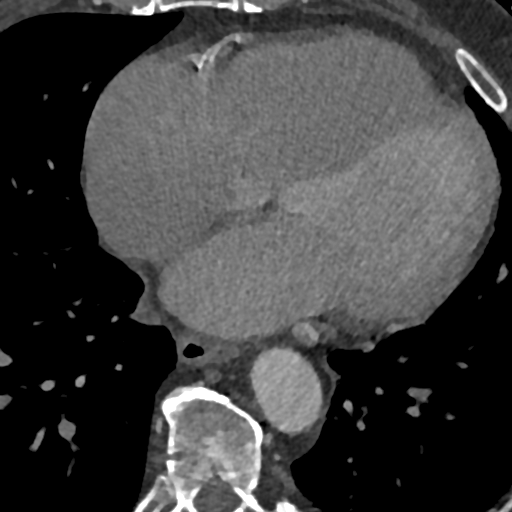
[im 1480/2960  vessel]
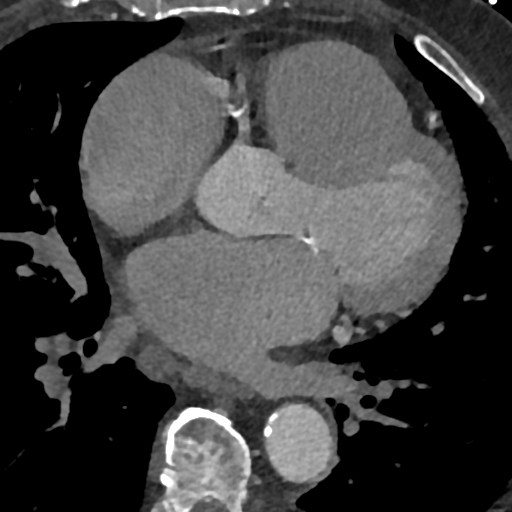
[im 1973/2960  vessel]
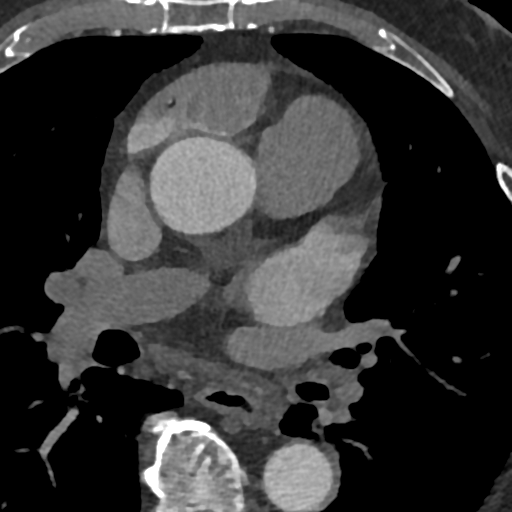
[im 2466/2960  vessel]
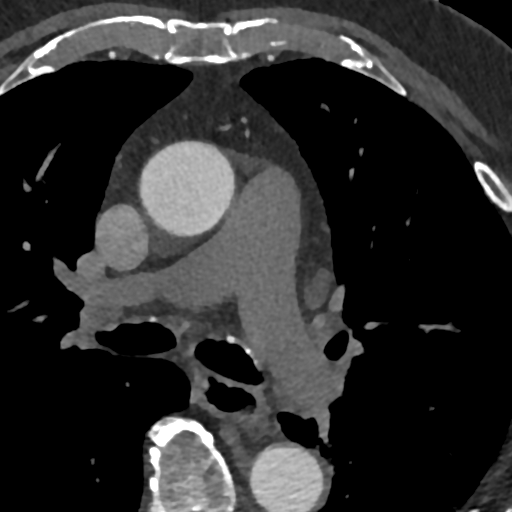

[10 of 20 positions shown; findings below may reference images not displayed]

FINDINGS: Vascular: Aortic atherosclerosis. Tortuous thoracic aorta. No
central pulmonary embolism, on this non-dedicated study.

Mediastinum/Nodes: 1.4 cm subcarinal node is enlarged from 1.0 cm on
the prior.

A node within the azygoesophageal recess measures 11 mm today versus
7 mm on the prior.

Lungs/Pleura: Trace bilateral pleural fluid. Left greater than right
base dependent atelectasis.

Upper Abdomen: Normal imaged portions of the liver, spleen.

Musculoskeletal: No acute osseous abnormality. Moderate thoracic
spondylosis.
IMPRESSION: 1.  No acute findings in the imaged extracardiac chest.
2. Development of mild thoracic adenopathy since [DATE].
Possibly reactive or in the setting of atrial fibrillation due to
mild fluid overload. This could be re-evaluated with contrast
enhanced chest CT at 6 months.
3. Trace bilateral pleural fluid.
4.  Aortic Atherosclerosis ([YQ]-[YQ]).
FINDINGS: Non-cardiac: See separate report from [REDACTED].

The pulmonary veins drain normally to the left atrium. On the
contrast images, there is a filling defect in the distal LA
appendage. On the delayed images, no LA appendage thrombus is
visualized.

Pulmonary veins:

LSPV 19 x 17 mm, 2.3 cm^2

LIPV 19 x 16 mm, 2.26 cm^2

RSPV 22 x 21 mm, 3.56 cm^2

RIPV 19 x 18 mm, 2.49 cm^2

Calcium Score: [YQ] Agatston units.

Coronary Arteries: Right dominant with no anomalies

The proximal LAD, proximal LCx, and proximal and distal RCA are
heavily calcified. Contrast bolus not timed adequately to comment on
degree of stenosis in the arteries.
IMPRESSION: 1.  Pulmonary veins as noted above.

2.  No LA appendage thrombus seen (confirmed on delayed images).

3. Coronary artery calcium score [YQ] Agatston units, placing the
patient in the 99th percentile for age and gender. This suggests
high risk for future cardiac events.

4.  Study not protocoled for full assessment of the coronaries.

LESTERSITO

*** End of Addendum ***
EXAM:
OVER-READ INTERPRETATION  CT CHEST

The following report is an over-read performed by radiologist Dr.
LESTERSITO [REDACTED] on [DATE]. This over-read
does not include interpretation of cardiac or coronary anatomy or
pathology. The coronary CTA interpretation by the cardiologist is
attached.
FINDINGS: Vascular: Aortic atherosclerosis. Tortuous thoracic aorta. No
central pulmonary embolism, on this non-dedicated study.

Mediastinum/Nodes: 1.4 cm subcarinal node is enlarged from 1.0 cm on
the prior.

A node within the azygoesophageal recess measures 11 mm today versus
7 mm on the prior.

Lungs/Pleura: Trace bilateral pleural fluid. Left greater than right
base dependent atelectasis.

Upper Abdomen: Normal imaged portions of the liver, spleen.

Musculoskeletal: No acute osseous abnormality. Moderate thoracic
spondylosis.
IMPRESSION: 1.  No acute findings in the imaged extracardiac chest.
2. Development of mild thoracic adenopathy since [DATE].
Possibly reactive or in the setting of atrial fibrillation due to
mild fluid overload. This could be re-evaluated with contrast
enhanced chest CT at 6 months.
3. Trace bilateral pleural fluid.
4.  Aortic Atherosclerosis ([YQ]-[YQ]).

## 2020-04-22 MED ORDER — METOPROLOL TARTRATE 5 MG/5ML IV SOLN: 5.0000 mg | INTRAVENOUS | Status: DC | PRN

## 2020-04-22 MED ORDER — IOHEXOL 350 MG/ML SOLN
80.0000 mL | Freq: Once | INTRAVENOUS | Status: AC | PRN
  Administered 2020-04-22: 80 mL via INTRAVENOUS

## 2020-04-22 MED ORDER — METOPROLOL TARTRATE 5 MG/5ML IV SOLN
INTRAVENOUS | Status: AC
  Administered 2020-04-22: 5 mg via INTRAVENOUS
  Filled 2020-04-22: qty 5

## 2020-04-26 ENCOUNTER — Other Ambulatory Visit: Payer: Self-pay | Admitting: *Deleted

## 2020-04-26 DIAGNOSIS — Z01812 Encounter for preprocedural laboratory examination: Secondary | ICD-10-CM

## 2020-04-26 DIAGNOSIS — D582 Other hemoglobinopathies: Secondary | ICD-10-CM

## 2020-04-27 ENCOUNTER — Other Ambulatory Visit (HOSPITAL_COMMUNITY)
Admission: RE | Admit: 2020-04-27 | Discharge: 2020-04-27 | Disposition: A | Discharge status: Home / Self Care | Payer: Medicare Other | Source: Ambulatory Visit | Attending: Cardiology | Admitting: Cardiology

## 2020-04-27 DIAGNOSIS — Z20822 Contact with and (suspected) exposure to covid-19: Secondary | ICD-10-CM | POA: Insufficient documentation

## 2020-04-27 DIAGNOSIS — Z01812 Encounter for preprocedural laboratory examination: Secondary | ICD-10-CM | POA: Insufficient documentation

## 2020-04-27 LAB — CBC
Hematocrit: 33.1 % — ABNORMAL LOW (ref 37.5–51.0)
Hemoglobin: 10.4 g/dL — ABNORMAL LOW (ref 13.0–17.7)
MCH: 25.7 pg — ABNORMAL LOW (ref 26.6–33.0)
MCHC: 31.4 g/dL — ABNORMAL LOW (ref 31.5–35.7)
MCV: 82 fL (ref 79–97)
Platelets: 248 10*3/uL (ref 150–450)
RBC: 4.04 x10E6/uL — ABNORMAL LOW (ref 4.14–5.80)
RDW: 14.2 % (ref 11.6–15.4)
WBC: 6.6 10*3/uL (ref 3.4–10.8)

## 2020-04-27 LAB — SARS CORONAVIRUS 2 (TAT 6-24 HRS): SARS Coronavirus 2: NEGATIVE

## 2020-04-28 NOTE — Progress Notes (Signed)
Instructed patient on the following items: Arrival time 0830 Nothing to eat or drink after midnight No meds AM of procedure Responsible person to drive you home and stay with you for 24 hrs  Have you missed any doses of anti-coagulant Eliquis- hasn't missed any doses   

## 2020-04-29 ENCOUNTER — Encounter (HOSPITAL_COMMUNITY)
Admission: RE | Disposition: A | Discharge status: Home / Self Care | Payer: Self-pay | Source: Home / Self Care | Attending: Cardiology

## 2020-04-29 ENCOUNTER — Ambulatory Visit (HOSPITAL_COMMUNITY)
Admission: RE | Admit: 2020-04-29 | Discharge: 2020-04-29 | Disposition: A | Discharge status: Home / Self Care | Payer: Medicare Other | Attending: Cardiology | Admitting: Cardiology

## 2020-04-29 ENCOUNTER — Ambulatory Visit (HOSPITAL_COMMUNITY): Payer: Medicare Other | Admitting: Anesthesiology

## 2020-04-29 ENCOUNTER — Other Ambulatory Visit: Payer: Self-pay

## 2020-04-29 DIAGNOSIS — I110 Hypertensive heart disease with heart failure: Secondary | ICD-10-CM | POA: Diagnosis not present

## 2020-04-29 DIAGNOSIS — Z809 Family history of malignant neoplasm, unspecified: Secondary | ICD-10-CM | POA: Diagnosis not present

## 2020-04-29 DIAGNOSIS — Z85828 Personal history of other malignant neoplasm of skin: Secondary | ICD-10-CM | POA: Insufficient documentation

## 2020-04-29 DIAGNOSIS — Z9079 Acquired absence of other genital organ(s): Secondary | ICD-10-CM | POA: Diagnosis not present

## 2020-04-29 DIAGNOSIS — Z7901 Long term (current) use of anticoagulants: Secondary | ICD-10-CM | POA: Diagnosis not present

## 2020-04-29 DIAGNOSIS — Z9049 Acquired absence of other specified parts of digestive tract: Secondary | ICD-10-CM | POA: Diagnosis not present

## 2020-04-29 DIAGNOSIS — E785 Hyperlipidemia, unspecified: Secondary | ICD-10-CM | POA: Insufficient documentation

## 2020-04-29 DIAGNOSIS — E1142 Type 2 diabetes mellitus with diabetic polyneuropathy: Secondary | ICD-10-CM | POA: Diagnosis not present

## 2020-04-29 DIAGNOSIS — I429 Cardiomyopathy, unspecified: Secondary | ICD-10-CM | POA: Insufficient documentation

## 2020-04-29 DIAGNOSIS — I4819 Other persistent atrial fibrillation: Secondary | ICD-10-CM | POA: Insufficient documentation

## 2020-04-29 DIAGNOSIS — E7800 Pure hypercholesterolemia, unspecified: Secondary | ICD-10-CM | POA: Diagnosis not present

## 2020-04-29 DIAGNOSIS — Z96653 Presence of artificial knee joint, bilateral: Secondary | ICD-10-CM | POA: Diagnosis not present

## 2020-04-29 DIAGNOSIS — Z8546 Personal history of malignant neoplasm of prostate: Secondary | ICD-10-CM | POA: Diagnosis not present

## 2020-04-29 DIAGNOSIS — Z634 Disappearance and death of family member: Secondary | ICD-10-CM | POA: Diagnosis not present

## 2020-04-29 DIAGNOSIS — I5042 Chronic combined systolic (congestive) and diastolic (congestive) heart failure: Secondary | ICD-10-CM | POA: Insufficient documentation

## 2020-04-29 HISTORY — PX: ATRIAL FIBRILLATION ABLATION: EP1191

## 2020-04-29 LAB — GLUCOSE, CAPILLARY
Glucose-Capillary: 108 mg/dL — ABNORMAL HIGH (ref 70–99)
Glucose-Capillary: 111 mg/dL — ABNORMAL HIGH (ref 70–99)

## 2020-04-29 LAB — POCT ACTIVATED CLOTTING TIME: Activated Clotting Time: 303 seconds

## 2020-04-29 SURGERY — ATRIAL FIBRILLATION ABLATION
Anesthesia: General

## 2020-04-29 MED ORDER — SODIUM CHLORIDE 0.9 % IV SOLN: INTRAVENOUS | Status: DC | PRN

## 2020-04-29 MED ORDER — PROTAMINE SULFATE 10 MG/ML IV SOLN
INTRAVENOUS | Status: DC | PRN
  Administered 2020-04-29: 10 mg via INTRAVENOUS
  Administered 2020-04-29: 30 mg via INTRAVENOUS

## 2020-04-29 MED ORDER — FENTANYL CITRATE (PF) 250 MCG/5ML IJ SOLN
INTRAMUSCULAR | Status: DC | PRN
  Administered 2020-04-29: 100 ug via INTRAVENOUS

## 2020-04-29 MED ORDER — ADENOSINE 6 MG/2ML IV SOLN
INTRAVENOUS | Status: AC
  Filled 2020-04-29: qty 2

## 2020-04-29 MED ORDER — HEPARIN (PORCINE) IN NACL 1000-0.9 UT/500ML-% IV SOLN
INTRAVENOUS | Status: DC | PRN
  Administered 2020-04-29: 500 mL

## 2020-04-29 MED ORDER — PHENYLEPHRINE HCL-NACL 10-0.9 MG/250ML-% IV SOLN
INTRAVENOUS | Status: DC | PRN
  Administered 2020-04-29: 25 ug/min via INTRAVENOUS

## 2020-04-29 MED ORDER — ACETAMINOPHEN 325 MG PO TABS
650.0000 mg | ORAL_TABLET | ORAL | Status: DC | PRN
  Filled 2020-04-29: qty 2

## 2020-04-29 MED ORDER — SUGAMMADEX SODIUM 200 MG/2ML IV SOLN
INTRAVENOUS | Status: DC | PRN
  Administered 2020-04-29: 200 mg via INTRAVENOUS

## 2020-04-29 MED ORDER — ADENOSINE 6 MG/2ML IV SOLN
INTRAVENOUS | Status: AC
  Filled 2020-04-29: qty 4

## 2020-04-29 MED ORDER — DOBUTAMINE IN D5W 4-5 MG/ML-% IV SOLN
INTRAVENOUS | Status: DC | PRN
  Administered 2020-04-29: 20 ug/kg/min via INTRAVENOUS

## 2020-04-29 MED ORDER — ONDANSETRON HCL 4 MG/2ML IJ SOLN: 4.0000 mg | Freq: Four times a day (QID) | INTRAMUSCULAR | Status: DC | PRN

## 2020-04-29 MED ORDER — ADENOSINE 6 MG/2ML IV SOLN
INTRAVENOUS | Status: DC | PRN
  Administered 2020-04-29 (×2): 12 mg via INTRAVENOUS

## 2020-04-29 MED ORDER — HEPARIN (PORCINE) IN NACL 1000-0.9 UT/500ML-% IV SOLN
INTRAVENOUS | Status: AC
  Filled 2020-04-29: qty 500

## 2020-04-29 MED ORDER — HEPARIN SODIUM (PORCINE) 1000 UNIT/ML IJ SOLN
INTRAMUSCULAR | Status: AC
  Filled 2020-04-29: qty 1

## 2020-04-29 MED ORDER — PROPOFOL 10 MG/ML IV BOLUS
INTRAVENOUS | Status: DC | PRN
  Administered 2020-04-29: 150 mg via INTRAVENOUS

## 2020-04-29 MED ORDER — LIDOCAINE 2% (20 MG/ML) 5 ML SYRINGE
INTRAMUSCULAR | Status: DC | PRN
  Administered 2020-04-29: 80 mg via INTRAVENOUS

## 2020-04-29 MED ORDER — HEPARIN SODIUM (PORCINE) 1000 UNIT/ML IJ SOLN
INTRAMUSCULAR | Status: DC | PRN
  Administered 2020-04-29: 1000 [IU] via INTRAVENOUS
  Administered 2020-04-29: 15000 [IU] via INTRAVENOUS

## 2020-04-29 MED ORDER — ONDANSETRON HCL 4 MG/2ML IJ SOLN
INTRAMUSCULAR | Status: DC | PRN
  Administered 2020-04-29: 4 mg via INTRAVENOUS

## 2020-04-29 MED ORDER — HEPARIN (PORCINE) IN NACL 1000-0.9 UT/500ML-% IV SOLN
INTRAVENOUS | Status: DC | PRN
Start: 2020-04-29 — End: 2020-04-29
  Administered 2020-04-29 (×4): 500 mL

## 2020-04-29 MED ORDER — PHENYLEPHRINE HCL (PRESSORS) 10 MG/ML IV SOLN
INTRAVENOUS | Status: DC | PRN
  Administered 2020-04-29 (×2): 80 ug via INTRAVENOUS

## 2020-04-29 MED ORDER — DOBUTAMINE IN D5W 4-5 MG/ML-% IV SOLN
INTRAVENOUS | Status: AC
  Filled 2020-04-29: qty 250

## 2020-04-29 MED ORDER — ROCURONIUM BROMIDE 10 MG/ML (PF) SYRINGE
PREFILLED_SYRINGE | INTRAVENOUS | Status: DC | PRN
  Administered 2020-04-29: 60 mg via INTRAVENOUS

## 2020-04-29 MED ORDER — SODIUM CHLORIDE 0.9 % IV SOLN: INTRAVENOUS | Status: DC

## 2020-04-29 SURGICAL SUPPLY — 21 items
BAG SNAP BAND KOVER 36X36 (MISCELLANEOUS) ×2 IMPLANT
BLANKET WARM UNDERBOD FULL ACC (MISCELLANEOUS) ×2 IMPLANT
CATH MAPPNG PENTARAY F 2-6-2MM (CATHETERS) ×1 IMPLANT
CATH S CIRCA THERM PROBE 10F (CATHETERS) ×2 IMPLANT
CATH SMTCH THERMOCOOL SF DF (CATHETERS) ×2 IMPLANT
CATH SOUNDSTAR ECO 8FR (CATHETERS) ×4 IMPLANT
CATH WEB BI DIR CSDF CRV REPRO (CATHETERS) ×2 IMPLANT
CLOSURE PERCLOSE PROSTYLE (VASCULAR PRODUCTS) ×8 IMPLANT
COVER SWIFTLINK CONNECTOR (BAG) ×2 IMPLANT
KIT VERSACROSS STEERABLE D1 (CATHETERS) ×2 IMPLANT
PACK EP LATEX FREE (CUSTOM PROCEDURE TRAY) ×2
PACK EP LF (CUSTOM PROCEDURE TRAY) ×1 IMPLANT
PAD PRO RADIOLUCENT 2001M-C (PAD) ×2 IMPLANT
PATCH CARTO3 (PAD) ×2 IMPLANT
PENTARAY F 2-6-2MM (CATHETERS) ×2
SHEATH CARTO VIZIGO SM CVD (SHEATH) ×2 IMPLANT
SHEATH PINNACLE 7F 10CM (SHEATH) ×2 IMPLANT
SHEATH PINNACLE 8F 10CM (SHEATH) ×4 IMPLANT
SHEATH PINNACLE 9F 10CM (SHEATH) ×2 IMPLANT
SHEATH PROBE COVER 6X72 (BAG) ×2 IMPLANT
TUBING SMART ABLATE COOLFLOW (TUBING) ×2 IMPLANT

## 2020-04-29 NOTE — Transfer of Care (Signed)
Immediate Anesthesia Transfer of Care Note  Patient: Charles Lloyd.  Procedure(s) Performed: ATRIAL FIBRILLATION ABLATION (N/A )  Patient Location: Cath Lab  Anesthesia Type:General  Level of Consciousness: awake, alert , oriented, patient cooperative and responds to stimulation  Airway & Oxygen Therapy: Patient Spontanous Breathing and Patient connected to nasal cannula oxygen  Post-op Assessment: Report given to RN and Post -op Vital signs reviewed and stable  Post vital signs: Reviewed and stable  Last Vitals:  Vitals Value Taken Time  BP 140/71 04/29/20 1156  Temp    Pulse 86 04/29/20 1200  Resp 17 04/29/20 1200  SpO2 96 % 04/29/20 1200  Vitals shown include unvalidated device data.  Last Pain:  Vitals:   04/29/20 0840  TempSrc:   PainSc: 0-No pain      Patients Stated Pain Goal: 3 (13/68/59 [id number])  Complications: No complications documented.

## 2020-04-29 NOTE — H&P (Signed)
Electrophysiology Office Note   Date:  04/29/2020   ID:  Charles Lloyd., DOB 11-Mar-1940, MRN [id number]  PCP:  Isaac Bliss, [name], MD  Cardiologist:  Bettina Gavia Primary Electrophysiologist:  Will Meredith Leeds, MD    Chief Complaint: atrial fibrillation initial   History of Present Illness: Charles Lloyd. is a 80 y.o. male who is being seen today for the evaluation of atrial fibrillation at the request of Charles Lloyd. Presenting today for electrophysiology evaluation.  He has a history of atrial fibrillation, moderate systolic heart failure, diabetes, hypertension, hyperlipidemia.  He had an ablation at [facility] in 2015 with cardioversion in 2017 and repeat cardioversion in 2019.  He had a repeat ablation 03/12/2019.  Unfortunately he went back into atrial fibrillation a few weeks ago.  He has weakness and fatigue.  He was started on digoxin, but became toxic and is now taking it 3 times a week.  Today, denies symptoms of palpitations, chest pain, shortness of breath, orthopnea, PND, lower extremity edema, claudication, dizziness, presyncope, syncope, bleeding, or neurologic sequela. The patient is tolerating medications without difficulties. AF ablation today.   Past Medical History:  Diagnosis Date  . Acute tracheobronchitis 01/05/2019  . B12 deficiency   . 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 Cancer ([place])   . Cardiomyopathy ([place]) 03/10/2018   With chronic atrial fibrillation  . CHF (congestive heart failure) ([place]) 04/16/2013   Functional class II, ejection fraction 35-40%  Formatting of this note might be different from the original. Functional class II, ejection fraction 35-40%  . Chronic anticoagulation   . Chronic diastolic (congestive) heart failure (HCC) 04/16/2013   Functional class II, ejection fraction 35-40%  Last Assessment & Plan:  Clinically stable Volume well controlled meds reviewed  . Chronic diastolic CHF (congestive heart failure)  ([place]) 04/16/2013   Functional class II, ejection fraction 35-40%  Last Assessment & Plan:  Clinically stable Volume well controlled meds reviewed  . Colon polyps 04/16/2013  . Diabetic neuropathy ([place])   . Eosinophil count raised 02/17/2019  . Essential hypertension 01/06/2010   Last Assessment & Plan:  Well controlled Continue med management  . 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  Steroid usage was discontinued several months ago.  Follow up with Dermatology as planned.  . Hypercholesterolemia 01/06/2010   Last Assessment & Plan:  Repeat labs recommended  . Hyperlipidemia associated with type 2 diabetes mellitus ([place]) 01/28/2018  . Hypertensive heart disease with heart failure ([place]) 01/06/2010   Last Assessment & Plan:  Well controlled Continue med management  . Hyponatremia 12/17/2018  . 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 Infection of prosthetic right knee joint ([place])   . 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 Lesion of liver 04/20/2019   -On cardiac CT 02/2019  . Mild reactive airways disease   . 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 On amiodarone therapy 09/07/2013  . On continuous oral anticoagulation 01/28/2018  . Other activity(E029.9) 04/20/2013   Formatting of this note might be  different from the original. Transthoracic Echocardiogram-03/10/2013-[facility]: Normal left ventricular wall thickness and cavity size.  Global left ventricular systolic function is moderately reduced.  The estimated ejection fraction is 35-40%.  The left atrium is moderately  enlarged.  The right atrium is mildly enlarged.  No significant valvular   . Overweight (BMI 25.0-29.9) 10/22/2016  . Persistent atrial fibrillation ([place]) 04/16/2013   Last Assessment & Plan:  Rate controlled Continue med management eliquis for stroke preventino  Formatting of this note might be different from the original.  Drug  HX Current Rx Pre-ABL inefficacy Pre-ABL intolerant Post-ABL inefficacy Post-ABL intolerant max dose/24h M/Y end comments  sotalol                  dofetilide                  flecainide                  propafenone                  am  . S/P TKR (total knee replacement), bilateral   . Secondary hypercoagulable state ([place]) 04/09/2019  . Tinea cruris 04/16/2013  . Type 2 diabetes mellitus with diabetic polyneuropathy, without long-term current use of insulin ([place]) 04/16/2013   Last Assessment & Plan:  Labs today Pt reports well controlled on ambulatory monitoring  . Vitamin D deficiency 07/25/2018   Past Surgical History:  Procedure Laterality Date  . ATRIAL FIBRILLATION ABLATION N/A 03/12/2019   Procedure: ATRIAL FIBRILLATION ABLATION;  Surgeon: [name], MD;  Location: [facility];  Service: Cardiovascular;  Laterality: N/A;  . CARDIAC ELECTROPHYSIOLOGY STUDY AND ABLATION    . CARDIOVERSION    . CARDIOVERSION N/A 10/10/2018   Procedure: CARDIOVERSION;  Surgeon: [name], MD;  Location: [facility] ENDOSCOPY;  Service: Cardiovascular;  Laterality: N/A;  . CHOLECYSTECTOMY    . PROSTATECTOMY    . REPLACEMENT TOTAL KNEE BILATERAL       Current Facility-Administered Medications  Medication Dose Route Frequency Provider Last Rate Last Admin  . 0.9 %  sodium chloride infusion   Intravenous Continuous [name], MD 50 mL/hr at 04/29/20 0844 New Bag at 04/29/20 0844    Allergies:   Patient has no known allergies.   Social History:  The patient  reports that he has never smoked. He has never used smokeless tobacco. He reports current alcohol use  of about 2.0 standard drinks of alcohol per week. He reports that he does not use drugs.   Family History:  The patient's family history includes Cancer in his father and mother; Depression in his father and mother; Early death in his father and mother.    ROS:  Please see the history of present illness.   Otherwise, review of systems is positive for none.   All other systems are reviewed and negative.   PHYSICAL EXAM: VS:  BP (!) 151/92   Pulse (!) 120   Temp (!) 97.4 F (36.3 C) (Oral)   Ht 5\' 10"  (1.778 m)   Wt 81.6 kg   SpO2 100%   BMI 25.83 kg/m  , BMI Body mass index is 25.83 kg/m. GEN: Well nourished, well developed, in no acute distress  HEENT: normal  Neck: no JVD, carotid bruits, or masses Cardiac: irregular; no murmurs, rubs, or gallops,no edema  Respiratory:  clear to auscultation bilaterally, normal work of breathing GI: soft, nontender, nondistended, + BS MS: no  deformity or atrophy  Skin: warm and dry Neuro:  Strength and sensation are intact Psych: euthymic mood, full affect  Recent Labs: 01/29/2020: Magnesium 1.5; TSH 0.912 03/09/2020: NT-Pro BNP 2,820 04/18/2020: BUN 16; Creatinine, Ser 1.12; Potassium 4.3; Sodium 141 04/26/2020: Hemoglobin 10.4; Platelets 248    Lipid Panel     Component Value Date/Time   CHOL 135 04/21/2019 0940   TRIG 81 04/21/2019 0940   HDL 41 04/21/2019 0940   CHOLHDL 3.3 04/21/2019 0940   CHOLHDL 3 07/24/2018 1058   VLDL 25.0 07/24/2018 1058   LDLCALC 78 04/21/2019 0940     Wt Readings from Last 3 Encounters:  04/29/20 81.6 kg  03/25/20 85.1 kg  02/25/20 81.9 kg      Other studies Reviewed: Additional studies/ records that were reviewed today include: TTE 10/07/18  Review of the above records today demonstrates:   1. The left ventricle has low normal systolic function, with an ejection fraction of 50-55%. The cavity size was normal. Left ventricular diastolic function could not be evaluated secondary to atrial  fibrillation. No evidence of left ventricular  regional wall motion abnormalities.  2. The right ventricle has normal systolic function. The cavity was normal. There is no increase in right ventricular wall thickness. Right ventricular systolic pressure is normal.  3. There is mild mitral annular calcification present.  4. The aortic valve is tricuspid. Moderate sclerosis of the aortic valve. Aortic valve regurgitation was not assessed by color flow Doppler.  5. The aorta is normal in size and structure.  6. There is right bowing of the interatrial septum, suggestive of elevated left atrial pressure.  Myoview 05/25/19  Nuclear stress EF: 60%.  No T wave inversion was noted during stress.  There was no ST segment deviation noted during stress.  This is a low risk study.   Normal perfusion. LVEF 60% with normal wall motion. This is a low risk study. No prior for comparison.  ASSESSMENT AND PLAN:  1.  Persistent atrial fibrillation: Charles Lloyd. has presented today for surgery, with the diagnosis of atrial fibrillation.  The various methods of treatment have been discussed with the patient and family. After consideration of risks, benefits and other options for treatment, the patient has consented to  Procedure(s): Catheter ablation as a surgical intervention .  Risks include but not limited to complete heart block, stroke, esophageal damage, nerve damage, bleeding, vascular damage, tamponade, perforation, MI, and death. The patient's history has been reviewed, patient examined, no change in status, stable for surgery.  I have reviewed the patient's chart and labs.  Questions were answered to the patient's satisfaction.    Will Curt Bears, MD 04/29/2020 9:43 AM

## 2020-04-29 NOTE — Anesthesia Procedure Notes (Signed)
Procedure Name: Intubation Date/Time: 04/29/2020 10:18 AM Performed by: Glynda Jaeger, CRNA Pre-anesthesia Checklist: Patient identified, Patient being monitored, Timeout performed, Emergency Drugs available and Suction available Patient Re-evaluated:Patient Re-evaluated prior to induction Oxygen Delivery Method: Circle System Utilized Preoxygenation: Pre-oxygenation with 100% oxygen Induction Type: IV induction Ventilation: Mask ventilation without difficulty and Oral airway inserted - appropriate to patient size Laryngoscope Size: 1 and Miller Grade View: Grade II Tube type: Oral Tube size: 7.5 mm Number of attempts: 1 Airway Equipment and Method: Stylet Placement Confirmation: ETT inserted through vocal cords under direct vision,  positive ETCO2 and breath sounds checked- equal and bilateral Secured at: 21 cm Tube secured with: Tape Dental Injury: Teeth and Oropharynx as per pre-operative assessment  Comments: Intubated by Stoltzfus MD

## 2020-04-29 NOTE — Anesthesia Preprocedure Evaluation (Signed)
Anesthesia Evaluation  Patient identified by MRN, date of birth, ID band Patient awake    Reviewed: Allergy & Precautions, H&P , NPO status , Patient's Chart, lab work & pertinent test results  Airway Mallampati: II   Neck ROM: full    Dental   Pulmonary neg pulmonary ROS,    breath sounds clear to auscultation       Cardiovascular hypertension, +CHF  + dysrhythmias Atrial Fibrillation  Rhythm:irregular Rate:Normal  TTE (09/2018): EF 50-55%   Neuro/Psych    GI/Hepatic   Endo/Other  diabetes, Type 2  Renal/GU      Musculoskeletal  (+) Arthritis ,   Abdominal   Peds  Hematology  (+) anemia ,   Anesthesia Other Findings   Reproductive/Obstetrics                             Anesthesia Physical Anesthesia Plan  ASA: III  Anesthesia Plan: General   Post-op Pain Management:    Induction: Intravenous  PONV Risk Score and Plan: 2 and Ondansetron, Dexamethasone and Treatment may vary due to age or medical condition  Airway Management Planned: Oral ETT  Additional Equipment:   Intra-op Plan:   Post-operative Plan: Extubation in OR  Informed Consent: I have reviewed the patients History and Physical, chart, labs and discussed the procedure including the risks, benefits and alternatives for the proposed anesthesia with the patient or authorized representative who has indicated his/her understanding and acceptance.     Dental advisory given  Plan Discussed with: CRNA, Anesthesiologist and Surgeon  Anesthesia Plan Comments:         Anesthesia Quick Evaluation

## 2020-04-29 NOTE — Discharge Instructions (Signed)
Post procedure care instructions No driving for 4 days. No lifting over 5 lbs for 1 week. No vigorous or sexual activity for 1 week. You may return to work/your usual activities on 05/07/20. Keep procedure site clean & dry. If you notice increased pain, swelling, bleeding or pus, call/return!  You may shower after 24 hours, but no soaking in baths/hot tubs/pools for 1 week.   You have an appointment set up with the [facility].  Multiple studies have shown that being followed by a dedicated atrial fibrillation clinic in addition to the standard care you receive from your other physicians improves health. We believe that enrollment in the atrial fibrillation clinic will allow [place] to better care for you.   The phone number to the [facility] is [phone]. The clinic is staffed Monday through Friday from 8:30am to 5pm.  Parking Directions: The clinic is located in the Heart and Vascular Building connected to [facility]. 1)From [address] turn on to [address] and go to the 3rd entrance  (Heart and Vascular entrance) on the right. 2)Look to the right for Heart &Vascular Parking Garage. 3)A code for the entrance is required, for April is 2231.   4)Take the elevators to the 1st floor. Registration is in the room with the glass walls at the end of the hallway.  If you have any trouble parking or locating the clinic, please don't hesitate to call [phone].  Cardiac Ablation, Care After  This sheet gives you information about how to care for yourself after your procedure. Your health care provider may also give you more specific instructions. If you have problems or questions, contact your health care provider. What can I expect after the procedure? After the procedure, it is common to have:  Bruising around your puncture site.  Tenderness around your puncture site.  Skipped heartbeats.  Tiredness (fatigue).  Follow these instructions at  home: Puncture site care   Follow instructions from your health care provider about how to take care of your puncture site. Make sure you: ? If present, leave stitches (sutures), skin glue, or adhesive strips in place. These skin closures may need to stay in place for up to 2 weeks. If adhesive strip edges start to loosen and curl up, you may trim the loose edges. Do not remove adhesive strips completely unless your health care provider tells you to do that. ? If a large square bandage is present, this may be removed 24 hours after surgery.   Check your puncture site every day for signs of infection. Check for: ? Redness, swelling, or pain. ? Fluid or blood. If your puncture site starts to bleed, lie down on your back, apply firm pressure to the area, and contact your health care provider. ? Warmth. ? Pus or a bad smell. Driving  Do not drive for at least 4 days after your procedure or however long your health care provider recommends. (Do not resume driving if you have previously been instructed not to drive for other health reasons.)  Do not drive or use heavy machinery while taking prescription pain medicine. Activity  Avoid activities that take a lot of effort for at least 7 days after your procedure.  Do not lift anything that is heavier than 5 lb (4.5 kg) for one week.   No sexual activity for 1 week.   Return to your normal activities as told by your health care provider. Ask your health care provider what activities are safe  for you. General instructions  Take over-the-counter and prescription medicines only as told by your health care provider.  Do not use any products that contain nicotine or tobacco, such as cigarettes and e-cigarettes. If you need help quitting, ask your health care provider.  You may shower after 24 hours, but Do not take baths, swim, or use a hot tub for 1 week.   Do not drink alcohol for 24 hours after your procedure.  Keep all follow-up visits as  told by your health care provider. This is important. Contact a health care provider if:  You have redness, mild swelling, or pain around your puncture site.  You have fluid or blood coming from your puncture site that stops after applying firm pressure to the area.  Your puncture site feels warm to the touch.  You have pus or a bad smell coming from your puncture site.  You have a fever.  You have chest pain or discomfort that spreads to your neck, jaw, or arm.  You are sweating a lot.  You feel nauseous.  You have a fast or irregular heartbeat.  You have shortness of breath.  You are dizzy or light-headed and feel the need to lie down.  You have pain or numbness in the arm or leg closest to your puncture site. Get help right away if:  Your puncture site suddenly swells.  Your puncture site is bleeding and the bleeding does not stop after applying firm pressure to the area. These symptoms may represent a serious problem that is an emergency. Do not wait to see if the symptoms will go away. Get medical help right away. Call your local emergency services (911 in the [place] Do not drive yourself to the hospital. Summary  After the procedure, it is normal to have bruising and tenderness at the puncture site in your groin, neck, or forearm.  Check your puncture site every day for signs of infection.  Get help right away if your puncture site is bleeding and the bleeding does not stop after applying firm pressure to the area. This is a medical emergency. This information is not intended to replace advice given to you by your health care provider. Make sure you discuss any questions you have with your health care provider.

## 2020-05-02 ENCOUNTER — Encounter (HOSPITAL_COMMUNITY): Payer: Self-pay | Admitting: Cardiology

## 2020-05-02 NOTE — Anesthesia Postprocedure Evaluation (Signed)
Anesthesia Post Note  Patient: Charles Lloyd.  Procedure(s) Performed: ATRIAL FIBRILLATION ABLATION (N/A )     Patient location during evaluation: PACU Anesthesia Type: General Level of consciousness: awake and alert Pain management: pain level controlled Vital Signs Assessment: post-procedure vital signs reviewed and stable Respiratory status: spontaneous breathing, nonlabored ventilation, respiratory function stable and patient connected to nasal cannula oxygen Cardiovascular status: blood pressure returned to baseline and stable Postop Assessment: no apparent nausea or vomiting Anesthetic complications: no   No complications documented.  Last Vitals:  Vitals:   04/29/20 1430 04/29/20 1500  BP: (!) 151/77 (!) 153/89  Pulse: (!) 101 97  Resp: 15 (!) 21  Temp:    SpO2: 100% 97%    Last Pain:  Vitals:   04/29/20 1152  TempSrc:   PainSc: 0-No pain                 Belenda Cruise P Kimball Appleby

## 2020-05-10 ENCOUNTER — Telehealth: Payer: Self-pay | Admitting: *Deleted

## 2020-05-10 DIAGNOSIS — R9389 Abnormal findings on diagnostic imaging of other specified body structures: Secondary | ICD-10-CM

## 2020-05-10 DIAGNOSIS — E7800 Pure hypercholesterolemia, unspecified: Secondary | ICD-10-CM

## 2020-05-10 DIAGNOSIS — Z79899 Other long term (current) drug therapy: Secondary | ICD-10-CM

## 2020-05-10 NOTE — Telephone Encounter (Signed)
Informed patient of results and verbal understanding expressed. He will stop by the [facility] office in next several week for fasting lipids. Aware we will arrange the f/u CT needed around August, when he sees Dr. Curt Bears in June for post ablation follow up. Patient verbalized understanding and agreeable to plan.

## 2020-05-10 NOTE — Telephone Encounter (Signed)
-----   Message from Will Meredith Leeds, MD sent at 04/25/2020  3:58 PM EST ----- CT without LAA thrombus. Needs fasting lipids and likely statin for high coronary calcium. Needs repeat CT in 6 months for thoracic adenopathy.

## 2020-05-12 ENCOUNTER — Telehealth: Payer: Self-pay | Admitting: Cardiology

## 2020-05-12 NOTE — Telephone Encounter (Signed)
STAT if HR is under 50 or over 120 (normal HR is 60-100 beats per minute)  1) What is your heart rate? This morning 150 to 160  2) Do you have a log of your heart rate readings (document readings)? Some   3) Do you have any other symptoms? Shaking a lot and very jumpy

## 2020-05-12 NOTE — Telephone Encounter (Signed)
Patient called in HR 140's- 150's with associated weakness and heart pounding.  He has had an ablation in Jan. 2022 and March 2022. Per pt he took PRN cardizem as ordered about an hour ago.  He reports HR has decreased about 5 points.  He has already taken his am metoprolol.  He reports that he has not missed any doses of eliquis. I advised him to lie down if he feels bad and give medications a little more time to take effect.  I advised him to call back if his HR is still elevated in 3 hours for further advisement.  He is agreeable to plan.

## 2020-05-13 ENCOUNTER — Other Ambulatory Visit: Payer: Self-pay

## 2020-05-13 ENCOUNTER — Ambulatory Visit (HOSPITAL_COMMUNITY)
Admission: RE | Admit: 2020-05-13 | Discharge: 2020-05-13 | Disposition: A | Discharge status: Home / Self Care | Payer: Medicare Other | Source: Ambulatory Visit | Attending: Physician Assistant | Admitting: Physician Assistant

## 2020-05-13 ENCOUNTER — Encounter (HOSPITAL_COMMUNITY): Payer: Self-pay | Admitting: Physician Assistant

## 2020-05-13 VITALS — BP 152/90 | HR 82 | Ht 70.0 in | Wt 179.0 lb

## 2020-05-13 DIAGNOSIS — E785 Hyperlipidemia, unspecified: Secondary | ICD-10-CM | POA: Insufficient documentation

## 2020-05-13 DIAGNOSIS — I5022 Chronic systolic (congestive) heart failure: Secondary | ICD-10-CM | POA: Diagnosis not present

## 2020-05-13 DIAGNOSIS — Z79899 Other long term (current) drug therapy: Secondary | ICD-10-CM | POA: Diagnosis not present

## 2020-05-13 DIAGNOSIS — I110 Hypertensive heart disease with heart failure: Secondary | ICD-10-CM | POA: Insufficient documentation

## 2020-05-13 DIAGNOSIS — I4819 Other persistent atrial fibrillation: Secondary | ICD-10-CM | POA: Diagnosis present

## 2020-05-13 DIAGNOSIS — D6869 Other thrombophilia: Secondary | ICD-10-CM

## 2020-05-13 DIAGNOSIS — I10 Essential (primary) hypertension: Secondary | ICD-10-CM | POA: Insufficient documentation

## 2020-05-13 DIAGNOSIS — Z7901 Long term (current) use of anticoagulants: Secondary | ICD-10-CM | POA: Insufficient documentation

## 2020-05-13 DIAGNOSIS — I700 Atherosclerosis of aorta: Secondary | ICD-10-CM | POA: Insufficient documentation

## 2020-05-13 LAB — BASIC METABOLIC PANEL
Anion gap: 9 (ref 5–15)
BUN: 12 mg/dL (ref 8–23)
CO2: 25 mmol/L (ref 22–32)
Calcium: 8.9 mg/dL (ref 8.9–10.3)
Chloride: 105 mmol/L (ref 98–111)
Creatinine, Ser: 1.06 mg/dL (ref 0.61–1.24)
GFR, Estimated: >60 mL/min (ref >60)
Glucose, Bld: 128 mg/dL — ABNORMAL HIGH (ref 70–99)
Potassium: 3.8 mmol/L (ref 3.5–5.1)
Sodium: 139 mmol/L (ref 135–145)

## 2020-05-13 LAB — CBC
HCT: 34.5 % — ABNORMAL LOW (ref 39.0–52.0)
Hemoglobin: 10.8 g/dL — ABNORMAL LOW (ref 13.0–17.0)
MCH: 25.5 pg — ABNORMAL LOW (ref 26.0–34.0)
MCHC: 31.3 g/dL (ref 30.0–36.0)
MCV: 81.4 fL (ref 80.0–100.0)
Platelets: 270 10*3/uL (ref 150–400)
RBC: 4.24 MIL/uL (ref 4.22–5.81)
RDW: 15.9 % — ABNORMAL HIGH (ref 11.5–15.5)
WBC: 5 10*3/uL (ref 4.0–10.5)
nRBC: 0 % (ref 0.0–0.2)

## 2020-05-13 MED ORDER — METOPROLOL TARTRATE 50 MG PO TABS: ORAL_TABLET | ORAL | 1 refills | Status: DC

## 2020-05-13 NOTE — Patient Instructions (Signed)
Cardioversion scheduled for Wednesday, March 23rd  - Arrive at the [facility] and go to admitting at 1130AM  - Do not eat or drink anything after midnight the night prior to your procedure.  - Take all your morning medication (except diabetic medications) with a sip of water prior to arrival.  - You will not be able to drive home after your procedure.  - Do NOT miss any doses of your blood thinner - if you should miss a dose please notify our office immediately.  - If you feel as if you go back into normal rhythm prior to scheduled cardioversion, please notify our office immediately. If your procedure is canceled in the cardioversion suite you will be charged a cancellation fee.   Increase metoprolol to 100mg  in the morning and 150mg  in the evening.  The night before cardioversion return to normal dosing of metoprolol.

## 2020-05-13 NOTE — Progress Notes (Signed)
Primary Care Physician: Isaac Bliss, [name], MD Primary Cardiologist: Dr Loney Hering Primary Electrophysiologist: Dr Curt Bears Referring Physician: Dr Adam Phenix. is a 80 y.o. male with a history of persistent atrial fibrillation, mild systolic heart failure, diabetes, HTN, hyperlipidemia who presents for follow up in the [facility]. She had ablation for atrial fibrillation in 2015 with a repeat cardioversion in 2017 and a repeat cardioversion September 2019. Patient had recurrence of his afib with symptoms of SOB and orthopnea and had attempted DCCV on 10/10/18 which was unsuccessful. Patient is on Eliquis for a CHADS2VASC score of 5. Patient is now s/p repeat ablation with Dr Curt Bears on 03/12/19.   On follow up today, patient had recurrence of his afib and underwent another ablation with Dr Curt Bears on 04/29/20. Patient reports that yesterday he woke up feeling like "I've been hit by a truck." His heart rates were ~160s. He took his PRN diltiazem which has slowed his heart rate but he still does not feel back to baseline. There were no specific triggers that he could identify.   Today, he denies symptoms of chest pain, shortness of breath, orthopnea, PND, dizziness, presyncope, syncope, snoring, daytime somnolence, bleeding, or neurologic sequela. The patient is tolerating medications without difficulties and is otherwise without complaint today.    Atrial Fibrillation Risk Factors:  he does not have symptoms or diagnosis of sleep apnea. he does not have a history of rheumatic fever.   he has a BMI of Body mass index is 25.68 kg/m.Marland Kitchen Filed Weights   05/13/20 1044  Weight: 81.2 kg    Family History  Problem Relation Age of Onset  . Cancer Mother   . Depression Mother   . Early death Mother   . Cancer Father   . Depression Father   . Early death Father      Atrial Fibrillation Management history:  Previous antiarrhythmic drugs:  none Previous cardioversions: 2017, 10/2017, 10/10/18 Previous ablations: 2015 [facility], 03/12/19, 04/29/20 CHADS2VASC score: 5 Anticoagulation history: Eliquis   Past Medical History:  Diagnosis Date  . Acute tracheobronchitis 01/05/2019  . B12 deficiency   . 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 Cancer ([place])   . Cardiomyopathy ([place]) 03/10/2018   With chronic atrial fibrillation  . CHF (congestive heart failure) ([place]) 04/16/2013   Functional class II, ejection fraction 35-40%  Formatting of this note might be different from the original. Functional class II, ejection fraction 35-40%  . Chronic anticoagulation   . Chronic diastolic (congestive) heart failure (HCC) 04/16/2013   Functional class II, ejection fraction 35-40%  Last Assessment & Plan:  Clinically stable Volume well controlled meds reviewed  . Chronic diastolic CHF (congestive heart failure) ([place]) 04/16/2013   Functional class II, ejection fraction 35-40%  Last Assessment & Plan:  Clinically stable Volume well controlled meds reviewed  . Colon polyps 04/16/2013  . Diabetic neuropathy ([place])   . Eosinophil count raised 02/17/2019  . Essential hypertension 01/06/2010   Last Assessment & Plan:  Well controlled Continue med management  . 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  Steroid usage was discontinued several months ago.  Follow up with Dermatology as planned.  . Hypercholesterolemia 01/06/2010   Last Assessment & Plan:  Repeat labs recommended  . Hyperlipidemia associated with type 2 diabetes mellitus ([place]) 01/28/2018  . Hypertensive heart disease with heart failure ([place]) 01/06/2010  Last Assessment & Plan:  Well controlled Continue med management  . Hyponatremia 12/17/2018  . 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 Infection of prosthetic right knee  joint ([place])   . 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 Lesion of liver 04/20/2019   -On cardiac CT 02/2019  . Mild reactive airways disease   . 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 On amiodarone therapy 09/07/2013  . On continuous oral anticoagulation 01/28/2018  . Other activity(E029.9) 04/20/2013   Formatting of this note might be different from the original. Transthoracic Echocardiogram-03/10/2013-[facility]: Normal left ventricular wall thickness and cavity size.  Global left ventricular systolic function is moderately reduced.  The estimated ejection fraction is 35-40%.  The left atrium is moderately enlarged.  The right atrium is mildly enlarged.  No significant valvular   . Overweight (BMI 25.0-29.9) 10/22/2016  . Persistent atrial fibrillation ([place]) 04/16/2013   Last Assessment & Plan:  Rate controlled Continue med management eliquis for stroke preventino  Formatting of this note might be different from the original.  Drug  HX Current Rx Pre-ABL inefficacy Pre-ABL intolerant Post-ABL inefficacy Post-ABL intolerant max dose/24h M/Y end comments  sotalol                  dofetilide                  flecainide                  propafenone                  am  . S/P TKR (total knee replacement), bilateral   . Secondary hypercoagulable state ([place]) 04/09/2019  . Tinea cruris 04/16/2013  . Type 2 diabetes mellitus with diabetic polyneuropathy, without long-term current use of insulin ([place]) 04/16/2013   Last Assessment & Plan:  Labs today Pt reports well controlled on ambulatory monitoring  . Vitamin D deficiency 07/25/2018   Past Surgical History:  Procedure Laterality Date  . ATRIAL FIBRILLATION ABLATION N/A 03/12/2019    Procedure: ATRIAL FIBRILLATION ABLATION;  Surgeon: [name], MD;  Location: [facility];  Service: Cardiovascular;  Laterality: N/A;  . ATRIAL FIBRILLATION ABLATION N/A 04/29/2020   Procedure: ATRIAL FIBRILLATION ABLATION;  Surgeon: [name], MD;  Location: [facility];  Service: Cardiovascular;  Laterality: N/A;  . CARDIAC ELECTROPHYSIOLOGY STUDY AND ABLATION    . CARDIOVERSION    . CARDIOVERSION N/A 10/10/2018   Procedure: CARDIOVERSION;  Surgeon: [name], MD;  Location[facility];  Service: Cardiovascular;  Laterality: N/A;  . CHOLECYSTECTOMY    . PROSTATECTOMY    . REPLACEMENT TOTAL KNEE BILATERAL      Current Outpatient Medications  Medication Sig Dispense Refill  . amoxicillin (AMOXIL) 500 MG capsule TAKE 4 CAPSULES BY MOUTH 1HOURS PRIOR TO DENTLA PROCEDURE    . atorvastatin (LIPITOR) 20 MG tablet TAKE 1 TABLET DAILY AT 6 P.M. 90 tablet 3  . budesonide (PULMICORT) 1 MG/2ML nebulizer solution     . diltiazem (CARDIZEM) 30 MG tablet Take 1 tablet (30 mg  total) by mouth every 6 (six) hours as needed (Take if your heart rate is 110 beats per minute.). 30 tablet 3  . ELIQUIS 5 MG TABS tablet TAKE 1 TABLET TWICE A DAY 180 tablet 3  . fluticasone (FLONASE) 50 MCG/ACT nasal spray Place 2 sprays into both nostrils daily. 16 g 2  . gabapentin (NEURONTIN) 300 MG capsule TAKE 2 CAPSULES AT BEDTIME 180 capsule 3  . glucose blood (ACCU-CHEK AVIVA PLUS) test strip 1 each by Other route daily. Dx E11.9 100 each 12  . lisinopril (ZESTRIL) 10 MG tablet Take 1 tablet by mouth daily.    . Magnesium 400 MG CAPS Take 400 mg by mouth in the morning and at bedtime. 28 capsule 0  . metFORMIN (GLUCOPHAGE) 500 MG tablet TAKE 1 TABLET TWICE A DAY WITH MEALS 180 tablet 3  . montelukast (SINGULAIR) 10 MG tablet Take 10 mg by mouth at bedtime.     . potassium chloride SA (KLOR-CON M20) 20 MEQ tablet Take 1 tablet (20 mEq total) by mouth daily. 90 tablet 3  . RESTASIS  0.05 % ophthalmic emulsion Place 1 drop into both eyes 2 (two) times daily.    Marland Kitchen torsemide (DEMADEX) 20 MG tablet Take 20 mg by mouth 2 (two) times a week.    . metoprolol tartrate (LOPRESSOR) 50 MG tablet Take 100mg  in the AM and 150mg  in the PM 360 tablet 1   No current facility-administered medications for this encounter.    No Known Allergies  Social History   Socioeconomic History  . Marital status: Widowed    Spouse name: Not on file  . Number of children: Not on file  . Years of education: Not on file  . Highest education level: Not on file  Occupational History  . Not on file  Tobacco Use  . Smoking status: Never Smoker  . Smokeless tobacco: Never Used  Vaping Use  . Vaping Use: Never used  Substance and Sexual Activity  . Alcohol use: Yes    Alcohol/week: 4.0 standard drinks    Types: 2 Glasses of wine, 2 Standard drinks or equivalent per week    Comment: daily  . Drug use: Never  . Sexual activity: Not Currently  Other Topics Concern  . Not on file  Social History Narrative  . Not on file   Social Determinants of Health   Financial Resource Strain: Not on file  Food Insecurity: Not on file  Transportation Needs: Not on file  Physical Activity: Not on file  Stress: Not on file  Social Connections: Not on file  Intimate Partner Violence: Not on file     ROS- All systems are reviewed and negative except as per the HPI above.  Physical Exam: Vitals:   05/13/20 1044  BP: (!) 152/90  Pulse: 82  SpO2: 98%  Weight: 81.2 kg  Height: 5\' 10"  (1.778 m)    GEN- The patient is a well appearing elderly male, alert and oriented x 3 today.   HEENT-head normocephalic, atraumatic, sclera clear, conjunctiva pink, hearing intact, trachea midline. Lungs- Clear to ausculation bilaterally, normal work of breathing Heart- irregular rate and rhythm, no murmurs, rubs or gallops  GI- soft, NT, ND, + BS Extremities- no clubbing, cyanosis, or edema MS- no significant  deformity or atrophy Skin- no rash or lesion Psych- euthymic mood, full affect Neuro- strength and sensation are intact   Wt Readings from Last 3 Encounters:  05/13/20 81.2 kg  04/29/20 81.6 kg  03/25/20 85.1  kg    EKG today demonstrates  Afib  Vent. rate 82 BPM PR interval * ms QRS duration 94 ms QT/QTc 400/467 ms  Echo 10/07/18 demonstrated  1. The left ventricle has low normal systolic function, with an ejection  fraction of 50-55%. The cavity size was normal. Left ventricular diastolic  function could not be evaluated secondary to atrial fibrillation. No  evidence of left ventricular  regional wall motion abnormalities.  2. The right ventricle has normal systolic function. The cavity was  normal. There is no increase in right ventricular wall thickness. Right  ventricular systolic pressure is normal.  3. There is mild mitral annular calcification present.  4. The aortic valve is tricuspid. Moderate sclerosis of the aortic valve.  Aortic valve regurgitation was not assessed by color flow Doppler.  5. The aorta is normal in size and structure.  6. There is right bowing of the interatrial septum, suggestive of  elevated left atrial pressure.   Epic records are reviewed at length today  CHA2DS2-VASc Score = 5  The patient's score is based upon: CHF History: Yes HTN History: Yes Diabetes History: Yes Stroke History: No Vascular Disease History: No Age Score: 2 Gender Score: 0      ASSESSMENT AND PLAN: 1. Persistent Atrial Fibrillation (ICD10:  I48.19) The patient's CHA2DS2-VASc score is 5, indicating a 7.2% annual risk of stroke.   S/p repeat afib ablation 04/29/20 Patient back in afib today. Reassured patient that some afib is not unusual for the first 3 months post ablation. Will arrange for DCCV. Check bmet/CBC Continue Eliquis 5 mg BID Increase Lopressor to 100 mg AM and 150 mg PM until DCCV.  Continue diltiazem 30 mg PRN q 4 hours for heart  racing.  2. Secondary Hypercoagulable State (ICD10:  D68.69) The patient is at significant risk for stroke/thromboembolism based upon his CHA2DS2-VASc Score of 5.  Continue Apixaban (Eliquis).   3. Chronic systolic CHF No signs or symptoms of fluid overload today.  4. HTN Stable, med changes as above.   Follow up in the AF clinic post DCCV as scheduled.    [facility] [address] [place], [place] [address] [phone] 05/13/2020 11:12 AM

## 2020-05-13 NOTE — Telephone Encounter (Signed)
Patient c/o Palpitations:  High priority if patient c/o lightheadedness, shortness of breath, or chest pain  1) How long have you had palpitations/irregular HR/ Afib? Are you having the symptoms now? Yesterday and having the symptoms now  2) Are you currently experiencing lightheadedness, SOB or CP? Yes to SOB and CP  3) Do you have a history of afib (atrial fibrillation) or irregular heart rhythm? yes  4) Have you checked your BP or HR? (document readings if available): Yes, 110  Are you experiencing any other symptoms? Yes

## 2020-05-13 NOTE — H&P (View-Only) (Signed)
Primary Care Physician: Isaac Bliss, [name], MD Primary Cardiologist: Dr Loney Hering Primary Electrophysiologist: Dr Curt Bears Referring Physician: Dr Adam Phenix. is a 80 y.o. male with a history of persistent atrial fibrillation, mild systolic heart failure, diabetes, HTN, hyperlipidemia who presents for follow up in the [facility]. She had ablation for atrial fibrillation in 2015 with a repeat cardioversion in 2017 and a repeat cardioversion September 2019. Patient had recurrence of his afib with symptoms of SOB and orthopnea and had attempted DCCV on 10/10/18 which was unsuccessful. Patient is on Eliquis for a CHADS2VASC score of 5. Patient is now s/p repeat ablation with Dr Curt Bears on 03/12/19.   On follow up today, patient had recurrence of his afib and underwent another ablation with Dr Curt Bears on 04/29/20. Patient reports that yesterday he woke up feeling like "I've been hit by a truck." His heart rates were ~160s. He took his PRN diltiazem which has slowed his heart rate but he still does not feel back to baseline. There were no specific triggers that he could identify.   Today, he denies symptoms of chest pain, shortness of breath, orthopnea, PND, dizziness, presyncope, syncope, snoring, daytime somnolence, bleeding, or neurologic sequela. The patient is tolerating medications without difficulties and is otherwise without complaint today.    Atrial Fibrillation Risk Factors:  he does not have symptoms or diagnosis of sleep apnea. he does not have a history of rheumatic fever.   he has a BMI of Body mass index is 25.68 kg/m.Marland Kitchen Filed Weights   05/13/20 1044  Weight: 81.2 kg    Family History  Problem Relation Age of Onset  . Cancer Mother   . Depression Mother   . Early death Mother   . Cancer Father   . Depression Father   . Early death Father      Atrial Fibrillation Management history:  Previous antiarrhythmic drugs:  none Previous cardioversions: 2017, 10/2017, 10/10/18 Previous ablations: 2015 [facility], 03/12/19, 04/29/20 CHADS2VASC score: 5 Anticoagulation history: Eliquis   Past Medical History:  Diagnosis Date  . Acute tracheobronchitis 01/05/2019  . B12 deficiency   . 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 Cancer ([place])   . Cardiomyopathy ([place]) 03/10/2018   With chronic atrial fibrillation  . CHF (congestive heart failure) ([place]) 04/16/2013   Functional class II, ejection fraction 35-40%  Formatting of this note might be different from the original. Functional class II, ejection fraction 35-40%  . Chronic anticoagulation   . Chronic diastolic (congestive) heart failure (HCC) 04/16/2013   Functional class II, ejection fraction 35-40%  Last Assessment & Plan:  Clinically stable Volume well controlled meds reviewed  . Chronic diastolic CHF (congestive heart failure) ([place]) 04/16/2013   Functional class II, ejection fraction 35-40%  Last Assessment & Plan:  Clinically stable Volume well controlled meds reviewed  . Colon polyps 04/16/2013  . Diabetic neuropathy ([place])   . Eosinophil count raised 02/17/2019  . Essential hypertension 01/06/2010   Last Assessment & Plan:  Well controlled Continue med management  . 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  Steroid usage was discontinued several months ago.  Follow up with Dermatology as planned.  . Hypercholesterolemia 01/06/2010   Last Assessment & Plan:  Repeat labs recommended  . Hyperlipidemia associated with type 2 diabetes mellitus ([place]) 01/28/2018  . Hypertensive heart disease with heart failure ([place]) 01/06/2010  Last Assessment & Plan:  Well controlled Continue med management  . Hyponatremia 12/17/2018  . 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 Infection of prosthetic right knee  joint ([place])   . 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 Lesion of liver 04/20/2019   -On cardiac CT 02/2019  . Mild reactive airways disease   . 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 On amiodarone therapy 09/07/2013  . On continuous oral anticoagulation 01/28/2018  . Other activity(E029.9) 04/20/2013   Formatting of this note might be different from the original. Transthoracic Echocardiogram-03/10/2013-[facility]: Normal left ventricular wall thickness and cavity size.  Global left ventricular systolic function is moderately reduced.  The estimated ejection fraction is 35-40%.  The left atrium is moderately enlarged.  The right atrium is mildly enlarged.  No significant valvular   . Overweight (BMI 25.0-29.9) 10/22/2016  . Persistent atrial fibrillation ([place]) 04/16/2013   Last Assessment & Plan:  Rate controlled Continue med management eliquis for stroke preventino  Formatting of this note might be different from the original.  Drug  HX Current Rx Pre-ABL inefficacy Pre-ABL intolerant Post-ABL inefficacy Post-ABL intolerant max dose/24h M/Y end comments  sotalol                  dofetilide                  flecainide                  propafenone                  am  . S/P TKR (total knee replacement), bilateral   . Secondary hypercoagulable state ([place]) 04/09/2019  . Tinea cruris 04/16/2013  . Type 2 diabetes mellitus with diabetic polyneuropathy, without long-term current use of insulin ([place]) 04/16/2013   Last Assessment & Plan:  Labs today Pt reports well controlled on ambulatory monitoring  . Vitamin D deficiency 07/25/2018   Past Surgical History:  Procedure Laterality Date  . ATRIAL FIBRILLATION ABLATION N/A 03/12/2019    Procedure: ATRIAL FIBRILLATION ABLATION;  Surgeon: [name], MD;  Location: [facility];  Service: Cardiovascular;  Laterality: N/A;  . ATRIAL FIBRILLATION ABLATION N/A 04/29/2020   Procedure: ATRIAL FIBRILLATION ABLATION;  Surgeon: [name], MD;  Location: [facility];  Service: Cardiovascular;  Laterality: N/A;  . CARDIAC ELECTROPHYSIOLOGY STUDY AND ABLATION    . CARDIOVERSION    . CARDIOVERSION N/A 10/10/2018   Procedure: CARDIOVERSION;  Surgeon: [name], MD;  Location: [facility] ENDOSCOPY;  Service: Cardiovascular;  Laterality: N/A;  . CHOLECYSTECTOMY    . PROSTATECTOMY    . REPLACEMENT TOTAL KNEE BILATERAL      Current Outpatient Medications  Medication Sig Dispense Refill  . amoxicillin (AMOXIL) 500 MG capsule TAKE 4 CAPSULES BY MOUTH 1HOURS PRIOR TO DENTLA PROCEDURE    . atorvastatin (LIPITOR) 20 MG tablet TAKE 1 TABLET DAILY AT 6 P.M. 90 tablet 3  . budesonide (PULMICORT) 1 MG/2ML nebulizer solution     . diltiazem (CARDIZEM) 30 MG tablet Take 1 tablet (30 mg  total) by mouth every 6 (six) hours as needed (Take if your heart rate is 110 beats per minute.). 30 tablet 3  . ELIQUIS 5 MG TABS tablet TAKE 1 TABLET TWICE A DAY 180 tablet 3  . fluticasone (FLONASE) 50 MCG/ACT nasal spray Place 2 sprays into both nostrils daily. 16 g 2  . gabapentin (NEURONTIN) 300 MG capsule TAKE 2 CAPSULES AT BEDTIME 180 capsule 3  . glucose blood (ACCU-CHEK AVIVA PLUS) test strip 1 each by Other route daily. Dx E11.9 100 each 12  . lisinopril (ZESTRIL) 10 MG tablet Take 1 tablet by mouth daily.    . Magnesium 400 MG CAPS Take 400 mg by mouth in the morning and at bedtime. 28 capsule 0  . metFORMIN (GLUCOPHAGE) 500 MG tablet TAKE 1 TABLET TWICE A DAY WITH MEALS 180 tablet 3  . montelukast (SINGULAIR) 10 MG tablet Take 10 mg by mouth at bedtime.     . potassium chloride SA (KLOR-CON M20) 20 MEQ tablet Take 1 tablet (20 mEq total) by mouth daily. 90 tablet 3  . RESTASIS  0.05 % ophthalmic emulsion Place 1 drop into both eyes 2 (two) times daily.    Marland Kitchen torsemide (DEMADEX) 20 MG tablet Take 20 mg by mouth 2 (two) times a week.    . metoprolol tartrate (LOPRESSOR) 50 MG tablet Take 100mg  in the AM and 150mg  in the PM 360 tablet 1   No current facility-administered medications for this encounter.    No Known Allergies  Social History   Socioeconomic History  . Marital status: Widowed    Spouse name: Not on file  . Number of children: Not on file  . Years of education: Not on file  . Highest education level: Not on file  Occupational History  . Not on file  Tobacco Use  . Smoking status: Never Smoker  . Smokeless tobacco: Never Used  Vaping Use  . Vaping Use: Never used  Substance and Sexual Activity  . Alcohol use: Yes    Alcohol/week: 4.0 standard drinks    Types: 2 Glasses of wine, 2 Standard drinks or equivalent per week    Comment: daily  . Drug use: Never  . Sexual activity: Not Currently  Other Topics Concern  . Not on file  Social History Narrative  . Not on file   Social Determinants of Health   Financial Resource Strain: Not on file  Food Insecurity: Not on file  Transportation Needs: Not on file  Physical Activity: Not on file  Stress: Not on file  Social Connections: Not on file  Intimate Partner Violence: Not on file     ROS- All systems are reviewed and negative except as per the HPI above.  Physical Exam: Vitals:   05/13/20 1044  BP: (!) 152/90  Pulse: 82  SpO2: 98%  Weight: 81.2 kg  Height: 5\' 10"  (1.778 m)    GEN- The patient is a well appearing elderly male, alert and oriented x 3 today.   HEENT-head normocephalic, atraumatic, sclera clear, conjunctiva pink, hearing intact, trachea midline. Lungs- Clear to ausculation bilaterally, normal work of breathing Heart- irregular rate and rhythm, no murmurs, rubs or gallops  GI- soft, NT, ND, + BS Extremities- no clubbing, cyanosis, or edema MS- no significant  deformity or atrophy Skin- no rash or lesion Psych- euthymic mood, full affect Neuro- strength and sensation are intact   Wt Readings from Last 3 Encounters:  05/13/20 81.2 kg  04/29/20 81.6 kg  03/25/20 85.1  kg    EKG today demonstrates  Afib  Vent. rate 82 BPM PR interval * ms QRS duration 94 ms QT/QTc 400/467 ms  Echo 10/07/18 demonstrated  1. The left ventricle has low normal systolic function, with an ejection  fraction of 50-55%. The cavity size was normal. Left ventricular diastolic  function could not be evaluated secondary to atrial fibrillation. No  evidence of left ventricular  regional wall motion abnormalities.  2. The right ventricle has normal systolic function. The cavity was  normal. There is no increase in right ventricular wall thickness. Right  ventricular systolic pressure is normal.  3. There is mild mitral annular calcification present.  4. The aortic valve is tricuspid. Moderate sclerosis of the aortic valve.  Aortic valve regurgitation was not assessed by color flow Doppler.  5. The aorta is normal in size and structure.  6. There is right bowing of the interatrial septum, suggestive of  elevated left atrial pressure.   Epic records are reviewed at length today  CHA2DS2-VASc Score = 5  The patient's score is based upon: CHF History: Yes HTN History: Yes Diabetes History: Yes Stroke History: No Vascular Disease History: No Age Score: 2 Gender Score: 0      ASSESSMENT AND PLAN: 1. Persistent Atrial Fibrillation (ICD10:  I48.19) The patient's CHA2DS2-VASc score is 5, indicating a 7.2% annual risk of stroke.   S/p repeat afib ablation 04/29/20 Patient back in afib today. Reassured patient that some afib is not unusual for the first 3 months post ablation. Will arrange for DCCV. Check bmet/CBC Continue Eliquis 5 mg BID Increase Lopressor to 100 mg AM and 150 mg PM until DCCV.  Continue diltiazem 30 mg PRN q 4 hours for heart  racing.  2. Secondary Hypercoagulable State (ICD10:  D68.69) The patient is at significant risk for stroke/thromboembolism based upon his CHA2DS2-VASc Score of 5.  Continue Apixaban (Eliquis).   3. Chronic systolic CHF No signs or symptoms of fluid overload today.  4. HTN Stable, med changes as above.   Follow up in the AF clinic post DCCV as scheduled.    [facility] [name] [place], [place] [address] [phone] 05/13/2020 11:12 AM

## 2020-05-13 NOTE — Telephone Encounter (Signed)
Spoke with the patient who states that he has been back in A Fib since yesterday. He states that he has been short of breath and his heart had been pounding. He also complains of some intermittent chest pain. Today his heart rate is in the 110s. He has been taking his PRN cardizem but states that it has not been helping much and he is still not feeling good.  Spoke with A Fib clinic and they are going to work him today. Patient is aware and is going to go there now.

## 2020-05-16 ENCOUNTER — Other Ambulatory Visit (HOSPITAL_COMMUNITY): Payer: Self-pay | Admitting: Physician Assistant

## 2020-05-16 ENCOUNTER — Other Ambulatory Visit (HOSPITAL_COMMUNITY): Payer: Self-pay | Admitting: *Deleted

## 2020-05-16 ENCOUNTER — Other Ambulatory Visit (HOSPITAL_COMMUNITY)
Admission: RE | Admit: 2020-05-16 | Discharge: 2020-05-16 | Disposition: A | Discharge status: Home / Self Care | Payer: Medicare Other | Source: Ambulatory Visit | Attending: Cardiovascular Disease | Admitting: Cardiovascular Disease

## 2020-05-16 DIAGNOSIS — Z20822 Contact with and (suspected) exposure to covid-19: Secondary | ICD-10-CM | POA: Diagnosis not present

## 2020-05-16 DIAGNOSIS — Z01812 Encounter for preprocedural laboratory examination: Secondary | ICD-10-CM | POA: Diagnosis present

## 2020-05-16 MED ORDER — DILTIAZEM HCL 30 MG PO TABS: ORAL_TABLET | ORAL | 3 refills | Status: DC

## 2020-05-17 LAB — SARS CORONAVIRUS 2 (TAT 6-24 HRS): SARS Coronavirus 2: NEGATIVE

## 2020-05-18 ENCOUNTER — Other Ambulatory Visit: Payer: Self-pay

## 2020-05-18 ENCOUNTER — Encounter (HOSPITAL_COMMUNITY)
Admission: RE | Disposition: A | Discharge status: Home / Self Care | Payer: Self-pay | Source: Home / Self Care | Attending: Cardiovascular Disease

## 2020-05-18 ENCOUNTER — Encounter (HOSPITAL_COMMUNITY): Payer: Self-pay | Admitting: Cardiovascular Disease

## 2020-05-18 ENCOUNTER — Ambulatory Visit (HOSPITAL_COMMUNITY)
Admission: RE | Admit: 2020-05-18 | Discharge: 2020-05-18 | Disposition: A | Discharge status: Home / Self Care | Payer: Medicare Other | Attending: Cardiovascular Disease | Admitting: Cardiovascular Disease

## 2020-05-18 ENCOUNTER — Ambulatory Visit (HOSPITAL_COMMUNITY): Payer: Medicare Other | Admitting: Certified Registered"

## 2020-05-18 DIAGNOSIS — Z79899 Other long term (current) drug therapy: Secondary | ICD-10-CM | POA: Insufficient documentation

## 2020-05-18 DIAGNOSIS — Z7984 Long term (current) use of oral hypoglycemic drugs: Secondary | ICD-10-CM | POA: Insufficient documentation

## 2020-05-18 DIAGNOSIS — I5022 Chronic systolic (congestive) heart failure: Secondary | ICD-10-CM | POA: Insufficient documentation

## 2020-05-18 DIAGNOSIS — Z7901 Long term (current) use of anticoagulants: Secondary | ICD-10-CM | POA: Insufficient documentation

## 2020-05-18 DIAGNOSIS — I110 Hypertensive heart disease with heart failure: Secondary | ICD-10-CM | POA: Diagnosis not present

## 2020-05-18 DIAGNOSIS — I4819 Other persistent atrial fibrillation: Secondary | ICD-10-CM | POA: Insufficient documentation

## 2020-05-18 DIAGNOSIS — D6869 Other thrombophilia: Secondary | ICD-10-CM | POA: Diagnosis not present

## 2020-05-18 HISTORY — PX: CARDIOVERSION: SHX1299

## 2020-05-18 SURGERY — CARDIOVERSION
Anesthesia: General

## 2020-05-18 MED ORDER — PROPOFOL 10 MG/ML IV BOLUS
INTRAVENOUS | Status: DC | PRN
  Administered 2020-05-18: 80 mg via INTRAVENOUS

## 2020-05-18 MED ORDER — SODIUM CHLORIDE 0.9 % IV SOLN: INTRAVENOUS | Status: DC | PRN

## 2020-05-18 MED ORDER — LIDOCAINE 2% (20 MG/ML) 5 ML SYRINGE
INTRAMUSCULAR | Status: DC | PRN
  Administered 2020-05-18: 60 mg via INTRAVENOUS

## 2020-05-18 MED ORDER — MAGNESIUM 400 MG PO CAPS: 400.0000 mg | ORAL_CAPSULE | Freq: Two times a day (BID) | ORAL | 0 refills | Status: DC

## 2020-05-18 NOTE — Anesthesia Procedure Notes (Signed)
Procedure Name: General with mask airway Date/Time: 05/18/2020 12:24 PM Performed by: Imagene Riches, CRNA Pre-anesthesia Checklist: Patient identified, Emergency Drugs available, Suction available, Patient being monitored and Timeout performed Patient Re-evaluated:Patient Re-evaluated prior to induction Oxygen Delivery Method: Ambu bag

## 2020-05-18 NOTE — Transfer of Care (Signed)
Immediate Anesthesia Transfer of Care Note  Patient: Charles Lloyd.  Procedure(s) Performed: CARDIOVERSION (N/A )  Patient Location: Endoscopy Unit  Anesthesia Type:General  Level of Consciousness: drowsy  Airway & Oxygen Therapy: Patient Spontanous Breathing  Post-op Assessment: Report given to RN and Post -op Vital signs reviewed and stable  Post vital signs: Reviewed and stable  Last Vitals:  Vitals Value Taken Time  BP    Temp    Pulse    Resp    SpO2      Last Pain:  Vitals:   05/18/20 1126  PainSc: 0-No pain         Complications: No complications documented.

## 2020-05-18 NOTE — Interval H&P Note (Signed)
History and Physical Interval Note:  05/18/2020 12:14 PM  Charles Lloyd.  has presented today for surgery, with the diagnosis of AFIB.  The various methods of treatment have been discussed with the patient and family. After consideration of risks, benefits and other options for treatment, the patient has consented to  Procedure(s): CARDIOVERSION (N/A) as a surgical intervention.  The patient's history has been reviewed, patient examined, no change in status, stable for surgery.  I have reviewed the patient's chart and labs.  Questions were answered to the patient's satisfaction.     Mertie Moores

## 2020-05-18 NOTE — CV Procedure (Signed)
    Cardioversion Note  Charles Lloyd [id number] Jun 02, 1940  Procedure: DC Cardioversion Indications:  Atrial fib   Procedure Details Consent: Obtained Time Out: Verified patient identification, verified procedure, site/side was marked, verified correct patient position, special equipment/implants available, Radiology Safety Procedures followed,  medications/allergies/relevent history reviewed, required imaging and test results available.  Performed  The patient has been on adequate anticoagulation.  The patient received IV  Lidocaine 60 mg followed by Propofol 80 mg IV   for sedation.  Synchronous cardioversion was performed at 200, 200  joules.  The cardioversion was successful.      Complications: No apparent complications Patient did tolerate procedure well.   Charles Lloyd, Charles Lloyd., MD, [facility] 05/18/2020, 12:29 PM

## 2020-05-18 NOTE — Discharge Instructions (Signed)

## 2020-05-18 NOTE — Anesthesia Preprocedure Evaluation (Signed)
Anesthesia Evaluation  Patient identified by MRN, date of birth, ID band Patient awake    Reviewed: Allergy & Precautions, H&P , NPO status , Patient's Chart, lab work & pertinent test results, reviewed documented beta blocker date and time   Airway Mallampati: I   Neck ROM: full    Dental no notable dental hx.    Pulmonary neg pulmonary ROS,    breath sounds clear to auscultation       Cardiovascular hypertension, Pt. on home beta blockers and Pt. on medications +CHF  Normal cardiovascular exam+ dysrhythmias Atrial Fibrillation  Rhythm:irregular Rate:Normal  TTE (09/2018): EF 50-55%   Neuro/Psych    GI/Hepatic negative GI ROS, Neg liver ROS,   Endo/Other  diabetes, Type 2  Renal/GU negative Renal ROS     Musculoskeletal  (+) Arthritis ,   Abdominal Normal abdominal exam  (+)   Peds  Hematology  (+) anemia ,   Anesthesia Other Findings   Reproductive/Obstetrics                             Anesthesia Physical  Anesthesia Plan  ASA: III  Anesthesia Plan: General   Post-op Pain Management:    Induction: Intravenous  PONV Risk Score and Plan: 2 and Ondansetron, Dexamethasone and Treatment may vary due to age or medical condition  Airway Management Planned: Oral ETT  Additional Equipment: None  Intra-op Plan:   Post-operative Plan: Extubation in OR  Informed Consent: I have reviewed the patients History and Physical, chart, labs and discussed the procedure including the risks, benefits and alternatives for the proposed anesthesia with the patient or authorized representative who has indicated his/her understanding and acceptance.     Dental advisory given  Plan Discussed with: CRNA  Anesthesia Plan Comments:         Anesthesia Quick Evaluation

## 2020-05-18 NOTE — Anesthesia Postprocedure Evaluation (Signed)
Anesthesia Post Note  Patient: Charles Lloyd.  Procedure(s) Performed: CARDIOVERSION (N/A )     Patient location during evaluation: Endoscopy Anesthesia Type: General Level of consciousness: awake Pain management: pain level controlled Vital Signs Assessment: post-procedure vital signs reviewed and stable Respiratory status: spontaneous breathing Cardiovascular status: stable Postop Assessment: no apparent nausea or vomiting Anesthetic complications: no   No complications documented.  Last Vitals:  Vitals:   05/18/20 1230 05/18/20 1240  BP: 133/66 (!) 148/87  Pulse: 71 70  Resp: 15 18  Temp: 36.5 C   SpO2: 100% 98%    Last Pain:  Vitals:   05/18/20 1240  TempSrc:   PainSc: 0-No pain                 Huston Foley

## 2020-05-21 ENCOUNTER — Encounter (HOSPITAL_COMMUNITY): Payer: Self-pay | Admitting: Cardiovascular Disease

## 2020-05-21 LAB — LIPID PANEL
Chol/HDL Ratio: 2.6 ratio (ref 0.0–5.0)
Cholesterol, Total: 120 mg/dL (ref 100–199)
HDL: 46 mg/dL (ref >39)
LDL Chol Calc (NIH): 59 mg/dL (ref 0–99)
Triglycerides: 73 mg/dL (ref 0–149)
VLDL Cholesterol Cal: 15 mg/dL (ref 5–40)

## 2020-05-24 ENCOUNTER — Encounter: Payer: Self-pay | Admitting: Internal Medicine

## 2020-05-24 ENCOUNTER — Ambulatory Visit: Payer: Medicare Other | Admitting: Adult Health

## 2020-05-24 ENCOUNTER — Ambulatory Visit: Payer: Medicare Other | Admitting: Pulmonary Disease

## 2020-05-24 ENCOUNTER — Ambulatory Visit (INDEPENDENT_AMBULATORY_CARE_PROVIDER_SITE_OTHER): Payer: Medicare Other | Admitting: Internal Medicine

## 2020-05-24 ENCOUNTER — Other Ambulatory Visit: Payer: Self-pay

## 2020-05-24 VITALS — BP 140/90 | HR 57 | Temp 98.1°F | Wt 175.7 lb

## 2020-05-24 DIAGNOSIS — G6289 Other specified polyneuropathies: Secondary | ICD-10-CM

## 2020-05-24 DIAGNOSIS — M791 Myalgia, unspecified site: Secondary | ICD-10-CM

## 2020-05-24 MED ORDER — GABAPENTIN 300 MG PO CAPS: 600.0000 mg | ORAL_CAPSULE | Freq: Every day | ORAL | 0 refills | Status: DC

## 2020-05-24 NOTE — Progress Notes (Signed)
Established Patient Office Visit     This visit occurred during the SARS-CoV-2 public health emergency.  Safety protocols were in place, including screening questions prior to the visit, additional usage of staff PPE, and extensive cleaning of exam room while observing appropriate contact time as indicated for disinfecting solutions.    CC/Reason for Visit: Continued joint pains and myalgias  HPI: Charles Lloyd. is a 80 y.o. male who is coming in today for the above mentioned reasons.  Back in September 2021 he started experiencing severe joint pains and myalgias mainly around the shoulder girdle but also affecting his wrists and hands.  It was thought he could possibly have polymyalgia rheumatica and had rapid improvement on a course of low-dose steroids.  Extensive arthritic work-up was done, please see my previous note for details.  He was referred to rheumatology who did not believe PMR was a likely etiology.  At that point he had been off steroids for 2 weeks and had not had any recurrence of symptoms.  Since I last saw him he has had many issues with his A. fib with RVR.  He had an unsuccessful ablation and ended up having a cardioversion 2 weeks ago which so far has been successful.  He has bilateral peripheral neuropathy of his lower extremities and is requesting a refill of his gabapentin today.  Past Medical/Surgical History: Past Medical History:  Diagnosis Date  . Acute tracheobronchitis 01/05/2019  . B12 deficiency   . 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 Cancer ([place])   . Cardiomyopathy ([place]) 03/10/2018   With chronic atrial fibrillation  . CHF (congestive heart failure) ([place]) 04/16/2013   Functional class II, ejection fraction 35-40%  Formatting of this note might be different from the original. Functional class II, ejection fraction 35-40%  . Chronic anticoagulation   . Chronic diastolic (congestive) heart failure (HCC) 04/16/2013   Functional  class II, ejection fraction 35-40%  Last Assessment & Plan:  Clinically stable Volume well controlled meds reviewed  . Chronic diastolic CHF (congestive heart failure) ([place]) 04/16/2013   Functional class II, ejection fraction 35-40%  Last Assessment & Plan:  Clinically stable Volume well controlled meds reviewed  . Colon polyps 04/16/2013  . Diabetic neuropathy ([place])   . Eosinophil count raised 02/17/2019  . Essential hypertension 01/06/2010   Last Assessment & Plan:  Well controlled Continue med management  . 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  Steroid usage was discontinued several months ago.  Follow up with Dermatology as planned.  . Hypercholesterolemia 01/06/2010   Last Assessment & Plan:  Repeat labs recommended  . Hyperlipidemia associated with type 2 diabetes mellitus ([place]) 01/28/2018  . Hypertensive heart disease with heart failure ([place]) 01/06/2010   Last Assessment & Plan:  Well controlled Continue med management  . Hyponatremia 12/17/2018  . 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 Infection of prosthetic right knee joint ([place])   . 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 Lesion of liver 04/20/2019   -On cardiac CT 02/2019  . Mild reactive airways disease   . 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 On amiodarone therapy 09/07/2013  . On continuous oral anticoagulation 01/28/2018  . Other activity(E029.9) 04/20/2013   Formatting of this note might be different from the original. Transthoracic Echocardiogram-03/10/[facility]: Normal left ventricular wall  thickness and cavity size.  Global left ventricular systolic function is moderately reduced.  The estimated ejection fraction is 35-40%.  The left atrium is moderately enlarged.  The right atrium is mildly enlarged.  No significant valvular   . Overweight (BMI 25.0-29.9) 10/22/2016  . Persistent atrial fibrillation ([place]) 04/16/2013   Last Assessment & Plan:  Rate controlled Continue med management eliquis for stroke preventino  Formatting of this note might be different from the original.  Drug  HX Current Rx Pre-ABL inefficacy Pre-ABL intolerant Post-ABL inefficacy Post-ABL intolerant max dose/24h M/Y end comments  sotalol                  dofetilide                  flecainide                  propafenone                  am  . S/P TKR (total knee replacement), bilateral   . Secondary hypercoagulable state ([place]) 04/09/2019  . Tinea cruris 04/16/2013  . Type 2 diabetes mellitus with diabetic polyneuropathy, without long-term current use of insulin ([place]) 04/16/2013   Last Assessment & Plan:  Labs today Pt reports well controlled on ambulatory monitoring  . Vitamin D deficiency 07/25/2018    Past Surgical History:  Procedure Laterality Date  . ATRIAL FIBRILLATION ABLATION N/A 03/12/2019   Procedure: ATRIAL FIBRILLATION ABLATION;  Surgeon: [name], MD;  Location: [facility];  Service: Cardiovascular;  Laterality: N/A;  . ATRIAL FIBRILLATION ABLATION N/A 04/29/2020   Procedure: ATRIAL FIBRILLATION ABLATION;  Surgeon: [name], MD;  Location: [place] [facility];  Service: Cardiovascular;  Laterality: N/A;  . CARDIAC ELECTROPHYSIOLOGY STUDY AND ABLATION    . CARDIOVERSION    . CARDIOVERSION N/A 10/10/2018   Procedure: CARDIOVERSION;  Surgeon: [name], MD;  Location: [facility] ENDOSCOPY;  Service: Cardiovascular;  Laterality: N/A;  . CARDIOVERSION N/A 05/18/2020   Procedure: CARDIOVERSION;  Surgeon: [name], MD;  Location: [place];  Service: Cardiovascular;   Laterality: N/A;  . CHOLECYSTECTOMY    . PROSTATECTOMY    . REPLACEMENT TOTAL KNEE BILATERAL      Social History:  reports that he has never smoked. He has never used smokeless tobacco. He reports current alcohol use of about 4.0 standard drinks of alcohol per week. He reports that he does not use drugs.  Allergies: No Known Allergies  Family History:  Family History  Problem Relation Age of Onset  . Cancer Mother   . Depression Mother   . Early death Mother   . Cancer Father   . Depression Father   . Early death Father      Current Outpatient Medications:  .  amoxicillin (AMOXIL) 500 MG capsule, Take 2,000 mg by mouth See admin instructions. Take 4 capsules (2000 mg) by mouth 1 hour prior to dental procedures., Disp: , Rfl:  .  atorvastatin (LIPITOR) 20 MG tablet, TAKE 1 TABLET DAILY AT 6 P.M., Disp: 90 tablet, Rfl: 3 .  diltiazem (CARDIZEM) 30 MG tablet, TAKE 1 TABLET  EVERY 4 HOURS AS NEEDED FOR AFIB HEART RATE >100, Disp: 60 tablet, Rfl: 3 .  ELIQUIS 5 MG TABS tablet, TAKE 1 TABLET TWICE A DAY (Patient taking differently: Take 5 mg by mouth 2 (two) times daily.), Disp: 180 tablet, Rfl: 3 .  fluticasone (FLONASE) 50 MCG/ACT nasal spray, Place 2 sprays into both nostrils daily. (Patient taking differently: Place 2 sprays into both nostrils daily as needed for allergies.), Disp: 16 g, Rfl: 2 .  glucose blood (ACCU-CHEK AVIVA PLUS) test strip, 1 each by Other route daily. Dx E11.9, Disp: 100 each, Rfl: 12 .  Magnesium 400 MG CAPS, Take 400 mg by mouth in the morning and at bedtime., Disp: 28 capsule, Rfl: 0 .  metFORMIN (GLUCOPHAGE) 500 MG tablet, TAKE 1 TABLET TWICE A DAY WITH MEALS (Patient taking differently: Take 500 mg by mouth in the morning and at bedtime.), Disp: 180 tablet, Rfl: 3 .  metoprolol tartrate (LOPRESSOR) 50 MG tablet, Take 100mg  in the AM and 150mg  in the PM (Patient taking differently: Take 100 mg by mouth 2 (two) times daily.), Disp: 360 tablet, Rfl: 1 .   montelukast (SINGULAIR) 10 MG tablet, Take 10 mg by mouth at bedtime. , Disp: , Rfl:  .  potassium chloride SA (KLOR-CON M20) 20 MEQ tablet, Take 1 tablet (20 mEq total) by mouth daily. (Patient taking differently: Take 20 mEq by mouth daily as needed (fluid retention (with torsemide)).), Disp: 90 tablet, Rfl: 3 .  RESTASIS 0.05 % ophthalmic emulsion, Place 1 drop into both eyes 2 (two) times daily., Disp: , Rfl:  .  torsemide (DEMADEX) 20 MG tablet, Take 20 mg by mouth daily as needed (fluid retention)., Disp: , Rfl:  .  gabapentin (NEURONTIN) 300 MG capsule, Take 2 capsules (600 mg total) by mouth at bedtime., Disp: 60 capsule, Rfl: 0  Review of Systems:  Constitutional: Denies fever, chills, diaphoresis, appetite change and fatigue.  HEENT: Denies photophobia, eye pain, redness, hearing loss, ear pain, congestion, sore throat, rhinorrhea, sneezing, mouth sores, trouble swallowing, neck pain, neck stiffness and tinnitus.   Respiratory: Denies SOB, DOE, cough, chest tightness,  and wheezing.   Cardiovascular: Denies chest pain, palpitations and leg swelling.  Gastrointestinal: Denies nausea, vomiting, abdominal pain, diarrhea, constipation, blood in stool and abdominal distention.  Genitourinary: Denies dysuria, urgency, frequency, hematuria, flank pain and difficulty urinating.  Endocrine: Denies: hot or cold intolerance, sweats, changes in hair or nails, polyuria, polydipsia. Musculoskeletal: Positive for myalgias, back pain, joint swelling, arthralgias and gait problem.  Skin: Denies pallor, rash and wound.  Neurological: Denies dizziness, seizures, syncope, weakness, light-headedness, numbness and headaches.  Hematological: Denies adenopathy. Easy bruising, personal or family bleeding history  Psychiatric/Behavioral: Denies suicidal ideation, mood changes, confusion, nervousness, sleep disturbance and agitation    Physical Exam: Vitals:   05/24/20 1029  BP: 140/90  Pulse: (!) 57   Temp: 98.1 F (36.7 C)  TempSrc: Oral  SpO2: 99%  Weight: 175 lb 11.2 oz (79.7 kg)    Body mass index is 25.21 kg/m.   Constitutional: NAD, calm, comfortable Eyes: PERRL, lids and conjunctivae nor Neurologic: Grossly intact and nonfocal Psychiatric: Normal judgment and insight. Alert and oriented x 3. Normal mood.    Impression and Plan:  Myalgia -Unclear etiology. -I am wary of prescribing prednisone again, especially in light of his recent issues with A. fib with RVR that failed ablation. -I believe it is time that he sees rheumatology again.  He is hesitant to schedule this visit as  he has been in and out of doctor offices lately.  Other polyneuropathy  - Plan: gabapentin (NEURONTIN) 300 MG capsule    Atara Paterson Isaac Bliss, MD [facility]

## 2020-05-30 ENCOUNTER — Encounter (HOSPITAL_COMMUNITY): Payer: Self-pay | Admitting: Physician Assistant

## 2020-05-30 ENCOUNTER — Ambulatory Visit (HOSPITAL_COMMUNITY)
Admission: RE | Admit: 2020-05-30 | Discharge: 2020-05-30 | Disposition: A | Discharge status: Home / Self Care | Payer: Medicare Other | Source: Ambulatory Visit | Attending: Physician Assistant | Admitting: Physician Assistant

## 2020-05-30 ENCOUNTER — Other Ambulatory Visit: Payer: Self-pay

## 2020-05-30 VITALS — BP 150/90 | HR 73 | Ht 70.0 in | Wt 176.8 lb

## 2020-05-30 DIAGNOSIS — E785 Hyperlipidemia, unspecified: Secondary | ICD-10-CM | POA: Insufficient documentation

## 2020-05-30 DIAGNOSIS — I5022 Chronic systolic (congestive) heart failure: Secondary | ICD-10-CM | POA: Insufficient documentation

## 2020-05-30 DIAGNOSIS — Z7984 Long term (current) use of oral hypoglycemic drugs: Secondary | ICD-10-CM | POA: Diagnosis not present

## 2020-05-30 DIAGNOSIS — I4819 Other persistent atrial fibrillation: Secondary | ICD-10-CM | POA: Insufficient documentation

## 2020-05-30 DIAGNOSIS — E119 Type 2 diabetes mellitus without complications: Secondary | ICD-10-CM | POA: Insufficient documentation

## 2020-05-30 DIAGNOSIS — Z7901 Long term (current) use of anticoagulants: Secondary | ICD-10-CM | POA: Diagnosis not present

## 2020-05-30 DIAGNOSIS — Z96653 Presence of artificial knee joint, bilateral: Secondary | ICD-10-CM | POA: Diagnosis not present

## 2020-05-30 DIAGNOSIS — D6869 Other thrombophilia: Secondary | ICD-10-CM | POA: Diagnosis not present

## 2020-05-30 DIAGNOSIS — I110 Hypertensive heart disease with heart failure: Secondary | ICD-10-CM | POA: Diagnosis not present

## 2020-05-30 DIAGNOSIS — Z79899 Other long term (current) drug therapy: Secondary | ICD-10-CM | POA: Diagnosis not present

## 2020-05-30 MED ORDER — METOPROLOL TARTRATE 50 MG PO TABS: 100.0000 mg | ORAL_TABLET | Freq: Two times a day (BID) | ORAL | 1 refills | Status: DC

## 2020-05-30 NOTE — Addendum Note (Signed)
Encounter addended by: Juluis Mire, RN on: 05/30/2020 2:29 PM  Actions taken: Order list changed

## 2020-05-30 NOTE — Progress Notes (Signed)
Primary Care Physician: Isaac Bliss, [name], MD Primary Cardiologist: Dr Loney Hering Primary Electrophysiologist: Dr Curt Bears Referring Physician: Dr Adam Phenix. is a 80 y.o. male with a history of persistent atrial fibrillation, mild systolic heart failure, diabetes, HTN, hyperlipidemia who presents for follow up in the [facility]. She had ablation for atrial fibrillation in 2015 with a repeat cardioversion in 2017 and a repeat cardioversion September 2019. Patient had recurrence of his afib with symptoms of SOB and orthopnea and had attempted DCCV on 10/10/18 which was unsuccessful. Patient is on Eliquis for a CHADS2VASC score of 5. Patient is now s/p repeat ablation with Dr Curt Bears on 03/12/19. He had recurrence of his afib and underwent another ablation with Dr Curt Bears on 04/29/20.  On follow up today, patient is s/p DCCV on 05/18/20. Patient reports he has done well and has not had any further afib symptoms. He denies any bleeding issues on anticoagulation.   Today, he denies symptoms of palpitations, chest pain, shortness of breath, orthopnea, PND, dizziness, presyncope, syncope, snoring, daytime somnolence, bleeding, or neurologic sequela. The patient is tolerating medications without difficulties and is otherwise without complaint today.    Atrial Fibrillation Risk Factors:  he does not have symptoms or diagnosis of sleep apnea. he does not have a history of rheumatic fever.   he has a BMI of Body mass index is 25.37 kg/m.Marland Kitchen Filed Weights   05/30/20 1346  Weight: 80.2 kg    Family History  Problem Relation Age of Onset  . Cancer Mother   . Depression Mother   . Early death Mother   . Cancer Father   . Depression Father   . Early death Father      Atrial Fibrillation Management history:  Previous antiarrhythmic drugs: none Previous cardioversions: 2017, 10/2017, 10/10/18, 05/18/20 Previous ablations: 2015 [facility], 03/12/19,  04/29/20 CHADS2VASC score: 5 Anticoagulation history: Eliquis   Past Medical History:  Diagnosis Date  . Acute tracheobronchitis 01/05/2019  . B12 deficiency   . 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 Cancer ([place])   . Cardiomyopathy ([place]) 03/10/2018   With chronic atrial fibrillation  . CHF (congestive heart failure) ([place]) 04/16/2013   Functional class II, ejection fraction 35-40%  Formatting of this note might be different from the original. Functional class II, ejection fraction 35-40%  . Chronic anticoagulation   . Chronic diastolic (congestive) heart failure (HCC) 04/16/2013   Functional class II, ejection fraction 35-40%  Last Assessment & Plan:  Clinically stable Volume well controlled meds reviewed  . Chronic diastolic CHF (congestive heart failure) ([place]) 04/16/2013   Functional class II, ejection fraction 35-40%  Last Assessment & Plan:  Clinically stable Volume well controlled meds reviewed  . Colon polyps 04/16/2013  . Diabetic neuropathy ([place])   . Eosinophil count raised 02/17/2019  . Essential hypertension 01/06/2010   Last Assessment & Plan:  Well controlled Continue med management  . 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  Steroid usage was discontinued several months ago.  Follow up with Dermatology as planned.  . Hypercholesterolemia 01/06/2010   Last Assessment & Plan:  Repeat labs recommended  . Hyperlipidemia associated with type 2 diabetes mellitus ([place]) 01/28/2018  . Hypertensive heart disease with heart failure ([place]) 01/06/2010   Last Assessment & Plan:  Well controlled Continue med management  . Hyponatremia 12/17/2018  . Infected prosthetic knee joint (  [place]) 06/24/2017   Last Assessment & Plan:  Id following ?ongoing abx managementy reported Request records  . Infection of prosthetic right knee joint ([place])   . Insomnia 03/04/2012   Grief with loss of wife 02/20/12  Last Assessment & Plan:   Improved sx  contineu xanax prn Se discussed  . Lesion of liver 04/20/2019   -On cardiac CT 02/2019  . Mild reactive airways disease   . 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 On amiodarone therapy 09/07/2013  . On continuous oral anticoagulation 01/28/2018  . Other activity(E029.9) 04/20/2013   Formatting of this note might be different from the original. Transthoracic Echocardiogram-[facility]: Normal left ventricular wall thickness and cavity size.  Global left ventricular systolic function is moderately reduced.  The estimated ejection fraction is 35-40%.  The left atrium is moderately enlarged.  The right atrium is mildly enlarged.  No significant valvular   . Overweight (BMI 25.0-29.9) 10/22/2016  . Persistent atrial fibrillation ([place]) 04/16/2013   Last Assessment & Plan:  Rate controlled Continue med management eliquis for stroke preventino  Formatting of this note might be different from the original.  Drug  HX Current Rx Pre-ABL inefficacy Pre-ABL intolerant Post-ABL inefficacy Post-ABL intolerant max dose/24h M/Y end comments  sotalol                  dofetilide                  flecainide                  propafenone                  am  . S/P TKR (total knee replacement), bilateral   . Secondary hypercoagulable state ([place]) 04/09/2019  . Tinea cruris 04/16/2013  . Type 2 diabetes mellitus with diabetic polyneuropathy, without long-term current use of insulin ([place]) 04/16/2013   Last Assessment & Plan:  Labs today Pt reports well controlled on ambulatory monitoring  . Vitamin D deficiency 07/25/2018   Past Surgical History:  Procedure Laterality Date  . ATRIAL FIBRILLATION ABLATION N/A 03/12/2019   Procedure: ATRIAL FIBRILLATION ABLATION;  Surgeon: [name], MD;  Location: [place] [facility];  Service: Cardiovascular;  Laterality: N/A;  . ATRIAL FIBRILLATION ABLATION N/A 04/29/2020   Procedure: ATRIAL FIBRILLATION ABLATION;  Surgeon: [name], MD;  Location: [place] [facility];  Service: Cardiovascular;  Laterality: N/A;  . CARDIAC ELECTROPHYSIOLOGY STUDY AND ABLATION    . CARDIOVERSION    . CARDIOVERSION N/A 10/10/2018   Procedure: CARDIOVERSION;  Surgeon: [name], MD;  Location: [facility] ENDOSCOPY;  Service: Cardiovascular;  Laterality: N/A;  . CARDIOVERSION N/A 05/18/2020   Procedure: CARDIOVERSION;  Surgeon: [name], MD;  Location: [place];  Service: Cardiovascular;  Laterality: N/A;  . CHOLECYSTECTOMY    . PROSTATECTOMY    . REPLACEMENT TOTAL KNEE BILATERAL      Current Outpatient Medications  Medication Sig Dispense Refill  . amoxicillin (AMOXIL) 500 MG capsule Take 2,000 mg by mouth See admin instructions. Take 4 capsules (2000 mg) by mouth 1 hour prior to dental procedures.    Marland Kitchen atorvastatin (LIPITOR) 20 MG tablet TAKE 1 TABLET DAILY AT 6 P.M. 90 tablet 3  . diltiazem (CARDIZEM) 30 MG tablet  TAKE 1 TABLET EVERY 4 HOURS AS NEEDED FOR AFIB HEART RATE >100 60 tablet 3  . ELIQUIS 5 MG TABS tablet TAKE 1 TABLET TWICE A DAY 180 tablet 3  . fluticasone (FLONASE) 50 MCG/ACT nasal spray Place 2 sprays into both nostrils daily. 16 g 2  . gabapentin (NEURONTIN) 300 MG capsule Take 2 capsules (600 mg total) by mouth at bedtime. 60 capsule 0  . glucose blood (ACCU-CHEK AVIVA PLUS) test strip 1 each by Other route daily. Dx E11.9 100 each 12  . metFORMIN (GLUCOPHAGE) 500 MG tablet TAKE 1 TABLET TWICE A DAY WITH MEALS 180 tablet 3  . metoprolol tartrate (LOPRESSOR) 50 MG tablet Take 100mg  in the AM and 150mg  in the PM 360 tablet 1  . montelukast (SINGULAIR) 10 MG tablet Take 10 mg by mouth at bedtime.     . potassium chloride SA (KLOR-CON M20) 20 MEQ tablet Take 1 tablet (20 mEq total) by mouth daily. 90 tablet 3  . RESTASIS 0.05 % ophthalmic emulsion  Place 1 drop into both eyes 2 (two) times daily.    Marland Kitchen torsemide (DEMADEX) 20 MG tablet Take 20 mg by mouth daily as needed (fluid retention).     No current facility-administered medications for this encounter.    No Known Allergies  Social History   Socioeconomic History  . Marital status: Widowed    Spouse name: Not on file  . Number of children: Not on file  . Years of education: Not on file  . Highest education level: Not on file  Occupational History  . Not on file  Tobacco Use  . Smoking status: Never Smoker  . Smokeless tobacco: Never Used  Vaping Use  . Vaping Use: Never used  Substance and Sexual Activity  . Alcohol use: Yes    Alcohol/week: 4.0 standard drinks    Types: 2 Glasses of wine, 2 Standard drinks or equivalent per week    Comment: daily  . Drug use: Never  . Sexual activity: Not Currently  Other Topics Concern  . Not on file  Social History Narrative  . Not on file   Social Determinants of Health   Financial Resource Strain: Not on file  Food Insecurity: Not on file  Transportation Needs: Not on file  Physical Activity: Not on file  Stress: Not on file  Social Connections: Not on file  Intimate Partner Violence: Not on file     ROS- All systems are reviewed and negative except as per the HPI above.  Physical Exam: Vitals:   05/30/20 1346  BP: (!) 150/90  Pulse: 73  Weight: 80.2 kg  Height: 5\' 10"  (1.778 m)    GEN- The patient is a well appearing elderly male, alert and oriented x 3 today.   HEENT-head normocephalic, atraumatic, sclera clear, conjunctiva pink, hearing intact, trachea midline. Lungs- Clear to ausculation bilaterally, normal work of breathing Heart- Regular rate and rhythm, no murmurs, rubs or gallops  GI- soft, NT, ND, + BS Extremities- no clubbing, cyanosis, or edema MS- no significant deformity or atrophy Skin- no rash or lesion Psych- euthymic mood, full affect Neuro- strength and sensation are intact   Wt  Readings from Last 3 Encounters:  05/30/20 80.2 kg  05/24/20 79.7 kg  05/18/20 81.2 kg    EKG today demonstrates  SR, 1st degree AV block PR 216 QRS 98 QTc 462  Echo 10/07/18 demonstrated  1. The left ventricle has low normal systolic function, with an ejection  fraction of  50-55%. The cavity size was normal. Left ventricular diastolic  function could not be evaluated secondary to atrial fibrillation. No  evidence of left ventricular  regional wall motion abnormalities.  2. The right ventricle has normal systolic function. The cavity was  normal. There is no increase in right ventricular wall thickness. Right  ventricular systolic pressure is normal.  3. There is mild mitral annular calcification present.  4. The aortic valve is tricuspid. Moderate sclerosis of the aortic valve.  Aortic valve regurgitation was not assessed by color flow Doppler.  5. The aorta is normal in size and structure.  6. There is right bowing of the interatrial septum, suggestive of  elevated left atrial pressure.   Epic records are reviewed at length today  CHA2DS2-VASc Score = 5  The patient's score is based upon: CHF History: Yes HTN History: Yes Diabetes History: Yes Stroke History: No Vascular Disease History: No Age Score: 2 Gender Score: 0      ASSESSMENT AND PLAN: 1. Persistent Atrial Fibrillation (ICD10:  I48.19) The patient's CHA2DS2-VASc score is 5, indicating a 7.2% annual risk of stroke.   S/p repeat afib ablation 04/29/20 S/p DCCV on 05/18/20 Patient appears to be maintaining SR. Continue Eliquis 5 mg BID Continue Lopressor 100 mg BID Continue diltiazem 30 mg PRN q 4 hours for heart racing.  2. Secondary Hypercoagulable State (ICD10:  D68.69) The patient is at significant risk for stroke/thromboembolism based upon his CHA2DS2-VASc Score of 5.  Continue Apixaban (Eliquis).   3. Chronic systolic CHF No sings or symptoms of fluid overload.  4. HTN Mildly elevated  today. If it remains elevated, could consider resuming lisinopril.    Follow up with Dr Curt Bears as scheduled.    [facility] [address] [place], [place] [address] [phone] 05/30/2020 2:17 PM

## 2020-05-31 ENCOUNTER — Telehealth: Payer: Self-pay | Admitting: Cardiology

## 2020-05-31 ENCOUNTER — Ambulatory Visit: Payer: Medicare Other | Admitting: Internal Medicine

## 2020-05-31 MED ORDER — VALSARTAN 160 MG PO TABS: 160.0000 mg | ORAL_TABLET | Freq: Every day | ORAL | 3 refills | Status: DC

## 2020-05-31 NOTE — Addendum Note (Signed)
Addended by: Resa Miner I on: 05/31/2020 01:08 PM   Modules accepted: Orders

## 2020-05-31 NOTE — Telephone Encounter (Signed)
Spoke to the patient just now and let him know Dr. Joya Gaskins recommendation. He verbalizes understanding and thanks me for the call back.

## 2020-05-31 NOTE — Telephone Encounter (Signed)
Patient came to the [facility] office shortly after I spoke with him this morning demanding to be seen by Dr. Bettina Gavia. After letting him know again that Dr. Loney Hering was not in the [facility] office this week and that is why we scheduled him for next week he requests that we ask Dr. Loney Hering if he should start back on his blood pressure medication or not. He states that his blood pressure medication was discontinued last year by Dr. Bettina Gavia. When looking through the notes it looks like Cardizem was discontinued.  The patient is aware that he could see another provider this week but refuses to do so. His blood pressure today when being evaluated by the A-fib clinic was 150/90.

## 2020-05-31 NOTE — Telephone Encounter (Signed)
Depends on his BP  Is he trending at home?

## 2020-05-31 NOTE — Telephone Encounter (Signed)
Spoke to the patient just now and got him scheduled to see Dr. Bettina Gavia on Friday April 15th as this was the soonest that Dr. Bettina Gavia had available in [facility].    Encouraged patient to call back with any questions or concerns.

## 2020-05-31 NOTE — Telephone Encounter (Signed)
Patient is calling to get a appt. Patient a Afib client on yesterday and the Pa [place] told him he needs to get a Dr. Hilaria Ota with Bettina Gavia as soon as possible because he blood pressure is too high. Can you see if you are able to work him in because Im showing Dr. Bettina Gavia first availability is not until May 6

## 2020-05-31 NOTE — Telephone Encounter (Signed)
Lets start an ARB valsartan 160 mg daily trend blood pressure daily at home we will do a BMP when he is in the office next week

## 2020-06-09 NOTE — Progress Notes (Signed)
Cardiology Office Note:    Date:  06/10/2020   ID:  Charles Gum., DOB Jul 23, 1940, MRN [id number]  PCP:  Isaac Bliss, [name], MD  Cardiologist:  [name], MD    Referring MD: Isaac Bliss, Estel*    ASSESSMENT:    1. Hypertensive heart disease with heart failure ([place])   2. Essential hypertension   3. Paroxysmal atrial fibrillation (HCC)   4. Secondary hypercoagulable state ([place])    PLAN:    In order of problems listed above:  1. Poorly controlled hypertension I am not sure what the precipitants will continue treatment including diuretic ARB transition from metoprolol to carvedilol for BP control and had amlodipine.  Strongly encouraged to purchase a blood pressure cuff check daily and trend his blood pressures and be back in the office in a few weeks to follow-up 2. Maintaining sinus rhythm 3. Continue his anticoagulant he was anemic I am concerned he may be bleeding and he has very disproportionate symptoms to blood pressure and we will recheck a CBC BMP today I have asked him to consider a CT of his head however he declined since that he can think about it   Next appointment: 3 to 4 weeks   Medication Adjustments/Labs and Tests Ordered: Current medicines are reviewed at length with the patient today.  Concerns regarding medicines are outlined above.  Orders Placed This Encounter  Procedures  . CBC  . Basic metabolic panel  . Pro b natriuretic peptide (BNP)   Meds ordered this encounter  Medications  . carvedilol (COREG) 12.5 MG tablet    Sig: Take 1 tablet (12.5 mg total) by mouth 2 (two) times daily.    Dispense:  180 tablet    Refill:  3  . amLODipine (NORVASC) 5 MG tablet    Sig: Take 1 tablet (5 mg total) by mouth daily.    Dispense:  90 tablet    Refill:  3    Chief Complaint  Patient presents with  . Follow-up  . Hypertension    History of Present Illness:    Charles Lloyd. is a 80 y.o. male with a hx of paroxysmal atrial  fibrillation having both radiofrequency catheter ablation of multiple cardioversions hypertensive heart disease with heart failure and chronic anticoagulation last seen 02/12/2020.  He was most recently seen in the A. fib clinic 05/31/2019 and have repeat A. fib ablation 03/12/2019 and 0 04/29/2020 and cardioversion 05/18/2020 to maintaining sinus rhythm at this time he is on no antiarrhythmic agent. Compliance with diet, lifestyle and medications: Yes  In the past few months his care has been through the EP and atrial fibrillation team. Her questions about blood pressure and treatment was routed back to me. Blood pressure today is 172/80 sitting and standing Since EP ablation he has not felt well he is lost appetite and has lost 15 pounds feels weak lightheaded when he stands up unsteady when he walks will not use the word headache but says he is a constant fullness in his head. He does not check blood pressure at home He has been compliant with his anticoagulant  Past Medical History:  Diagnosis Date  . Acute tracheobronchitis 01/05/2019  . B12 deficiency   . 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 Cancer ([place])   . Cardiomyopathy ([place]) 03/10/2018   With chronic atrial fibrillation  . CHF (congestive heart failure) ([place]) 04/16/2013   Functional class II, ejection fraction 35-40%  Formatting  of this note might be different from the original. Functional class II, ejection fraction 35-40%  . Chronic anticoagulation   . Chronic diastolic (congestive) heart failure (HCC) 04/16/2013   Functional class II, ejection fraction 35-40%  Last Assessment & Plan:  Clinically stable Volume well controlled meds reviewed  . Chronic diastolic CHF (congestive heart failure) ([place]) 04/16/2013   Functional class II, ejection fraction 35-40%  Last Assessment & Plan:  Clinically stable Volume well controlled meds reviewed  . Colon polyps 04/16/2013  . Diabetic neuropathy ([place])   . Eosinophil count  raised 02/17/2019  . Essential hypertension 01/06/2010   Last Assessment & Plan:  Well controlled Continue med management  . 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  Steroid usage was discontinued several months ago.  Follow up with Dermatology as planned.  . Hypercholesterolemia 01/06/2010   Last Assessment & Plan:  Repeat labs recommended  . Hyperlipidemia associated with type 2 diabetes mellitus ([place]) 01/28/2018  . Hypertensive heart disease with heart failure ([place]) 01/06/2010   Last Assessment & Plan:  Well controlled Continue med management  . Hyponatremia 12/17/2018  . 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 Infection of prosthetic right knee joint ([place])   . 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 Lesion of liver 04/20/2019   -On cardiac CT 02/2019  . Mild reactive airways disease   . 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 On amiodarone therapy 09/07/2013  . On continuous oral anticoagulation 01/28/2018  . Other activity(E029.9) 04/20/2013   Formatting of this note might be different from the original. Transthoracic Echocardiogram-[facility]/10/[facility]: Normal left ventricular wall thickness and cavity size.  Global left ventricular systolic function is moderately reduced.  The estimated ejection fraction is 35-40%.  The left atrium is moderately enlarged.  The right atrium is mildly enlarged.  No significant valvular   . Overweight (BMI 25.0-29.9) 10/22/2016  . Persistent atrial fibrillation ([place]) 04/16/2013   Last Assessment & Plan:  Rate controlled Continue med  management eliquis for stroke preventino  Formatting of this note might be different from the original.  Drug  HX Current Rx Pre-ABL inefficacy Pre-ABL intolerant Post-ABL inefficacy Post-ABL intolerant max dose/24h M/Y end comments  sotalol                  dofetilide                  flecainide                  propafenone                  am  . S/P TKR (total knee replacement), bilateral   . Secondary hypercoagulable state ([place]) 04/09/2019  . Tinea cruris 04/16/2013  . Type 2 diabetes mellitus with diabetic polyneuropathy, without long-term current use of insulin ([place]) 04/16/2013   Last Assessment & Plan:  Labs today Pt reports well controlled on ambulatory monitoring  . Vitamin D deficiency 07/25/2018    Past Surgical History:  Procedure Laterality Date  . ATRIAL  FIBRILLATION ABLATION N/A 03/12/2019   Procedure: ATRIAL FIBRILLATION ABLATION;  Surgeon: [name], MD;  Location: [place] [facility];  Service: Cardiovascular;  Laterality: N/A;  . ATRIAL FIBRILLATION ABLATION N/A 04/29/2020   Procedure: ATRIAL FIBRILLATION ABLATION;  Surgeon: [name], MD;  Location: [place] [facility];  Service: Cardiovascular;  Laterality: N/A;  . CARDIAC ELECTROPHYSIOLOGY STUDY AND ABLATION    . CARDIOVERSION    . CARDIOVERSION N/A 10/10/2018   Procedure: CARDIOVERSION;  Surgeon: [name], MD;  Location[facility] ENDOSCOPY;  Service: Cardiovascular;  Laterality: N/A;  . CARDIOVERSION N/A 05/18/2020   Procedure: CARDIOVERSION;  Surgeon: [name], MD;  Location: [place];  Service: Cardiovascular;  Laterality: N/A;  . CHOLECYSTECTOMY    . PROSTATECTOMY    . REPLACEMENT TOTAL KNEE BILATERAL      Current Medications: Current Meds  Medication Sig  . amLODipine (NORVASC) 5 MG tablet Take 1 tablet (5 mg total) by mouth daily.  Marland Kitchen amoxicillin (AMOXIL) 500 MG capsule Take 2,000 mg by mouth See admin instructions. Take 4 capsules (2000 mg) by mouth 1 hour prior to dental procedures.   Marland Kitchen atorvastatin (LIPITOR) 20 MG tablet TAKE 1 TABLET DAILY AT 6 P.M.  . carvedilol (COREG) 12.5 MG tablet Take 1 tablet (12.5 mg total) by mouth 2 (two) times daily.  Marland Kitchen diltiazem (CARDIZEM) 30 MG tablet TAKE 1 TABLET EVERY 4 HOURS AS NEEDED FOR AFIB HEART RATE >100  . ELIQUIS 5 MG TABS tablet TAKE 1 TABLET TWICE A DAY  . fluticasone (FLONASE) 50 MCG/ACT nasal spray Place 2 sprays into both nostrils daily.  Marland Kitchen gabapentin (NEURONTIN) 300 MG capsule Take 2 capsules (600 mg total) by mouth at bedtime.  Marland Kitchen glucose blood (ACCU-CHEK AVIVA PLUS) test strip 1 each by Other route daily. Dx E11.9  . metFORMIN (GLUCOPHAGE) 500 MG tablet TAKE 1 TABLET TWICE A DAY WITH MEALS  . montelukast (SINGULAIR) 10 MG tablet Take 10 mg by mouth at bedtime.   . potassium chloride SA (KLOR-CON M20) 20 MEQ tablet Take 1 tablet (20 mEq total) by mouth daily.  . RESTASIS 0.05 % ophthalmic emulsion Place 1 drop into both eyes 2 (two) times daily.  Marland Kitchen torsemide (DEMADEX) 20 MG tablet Take 20 mg by mouth daily as needed (fluid retention).  . valsartan (DIOVAN) 160 MG tablet Take 1 tablet (160 mg total) by mouth daily.  . [DISCONTINUED] metoprolol tartrate (LOPRESSOR) 50 MG tablet Take 2 tablets (100 mg total) by mouth 2 (two) times daily.     Allergies:   Patient has no known allergies.   Social History   Socioeconomic History  . Marital status: Widowed    Spouse name: Not on file  . Number of children: Not on file  . Years of education: Not on file  . Highest education level: Not on file  Occupational History  . Not on file  Tobacco Use  . Smoking status: Never Smoker  . Smokeless tobacco: Never Used  Vaping Use  . Vaping Use: Never used  Substance and Sexual Activity  . Alcohol use: Yes    Alcohol/week: 4.0 standard drinks    Types: 2 Glasses of wine, 2 Standard drinks or equivalent per week    Comment: daily  . Drug use: Never  . Sexual activity: Not Currently  Other Topics Concern  . Not on file  Social  History Narrative  . Not on file   Social Determinants of Health   Financial Resource Strain: Not on  file  Food Insecurity: Not on file  Transportation Needs: Not on file  Physical Activity: Not on file  Stress: Not on file  Social Connections: Not on file     Family History: The patient's family history includes Cancer in his father and mother; Depression in his father and mother; Early death in his father and mother. ROS:   Please see the history of present illness.    All other systems reviewed and are negative.  EKGs/Labs/Other Studies Reviewed:    The following studies were reviewed today:  EKG:  EKG 04074 2022 by EP team independently reviewed sinus rhythm normal demonstrates sinus rhythm  Recent Labs: 01/29/2020: Magnesium 1.5; TSH 0.912 03/09/2020: NT-Pro BNP 2,820 05/13/2020: BUN 12; Creatinine, Ser 1.06; Hemoglobin 10.8; Platelets 270; Potassium 3.8; Sodium 139  Recent Lipid Panel    Component Value Date/Time   CHOL 120 05/20/2020 0914   TRIG 73 05/20/2020 0914   HDL 46 05/20/2020 0914   CHOLHDL 2.6 05/20/2020 0914   CHOLHDL 3 07/24/2018 1058   VLDL 25.0 07/24/2018 1058   LDLCALC 59 05/20/2020 0914    Physical Exam:    VS:  BP (!) 176/4 (BP Location: Right Arm, Patient Position: Sitting)   Pulse 72   Ht 5\' 10"  (1.778 m)   Wt 175 lb 0.6 oz (79.4 kg)   SpO2 99%   BMI 25.12 kg/m     Wt Readings from Last 3 Encounters:  06/10/20 175 lb 0.6 oz (79.4 kg)  05/30/20 176 lb 12.8 oz (80.2 kg)  05/24/20 175 lb 11.2 oz (79.7 kg)     GEN: He has pallor of the skin of membranes well nourished, well developed in no acute distress HEENT: Normal NECK: No JVD; No carotid bruits LYMPHATICS: No lymphadenopathy CARDIAC: RRR, no murmurs, rubs, gallops RESPIRATORY:  Clear to auscultation without rales, wheezing or rhonchi  ABDOMEN: Soft, non-tender, non-distended MUSCULOSKELETAL:  No edema; No deformity  SKIN: Warm and dry NEUROLOGIC:  Alert and oriented x  3 PSYCHIATRIC:  Normal affect    Signed, [name], MD  06/10/2020 10:56 AM    [place]

## 2020-06-10 ENCOUNTER — Ambulatory Visit: Payer: Medicare Other | Admitting: Cardiology

## 2020-06-10 ENCOUNTER — Encounter: Payer: Self-pay | Admitting: Cardiology

## 2020-06-10 ENCOUNTER — Other Ambulatory Visit: Payer: Self-pay

## 2020-06-10 VITALS — BP 176/4 | HR 72 | Ht 70.0 in | Wt 175.0 lb

## 2020-06-10 DIAGNOSIS — D6869 Other thrombophilia: Secondary | ICD-10-CM | POA: Diagnosis not present

## 2020-06-10 DIAGNOSIS — I110 Hypertensive heart disease with heart failure: Secondary | ICD-10-CM | POA: Diagnosis not present

## 2020-06-10 DIAGNOSIS — I480 Paroxysmal atrial fibrillation: Secondary | ICD-10-CM | POA: Diagnosis not present

## 2020-06-10 DIAGNOSIS — I10 Essential (primary) hypertension: Secondary | ICD-10-CM

## 2020-06-10 MED ORDER — CARVEDILOL 12.5 MG PO TABS: 12.5000 mg | ORAL_TABLET | Freq: Two times a day (BID) | ORAL | 3 refills | Status: DC

## 2020-06-10 MED ORDER — AMLODIPINE BESYLATE 5 MG PO TABS: 5.0000 mg | ORAL_TABLET | Freq: Every day | ORAL | 3 refills | Status: DC

## 2020-06-10 NOTE — Patient Instructions (Signed)
Medication Instructions:  Your physician has recommended you make the following change in your medication:  STOP: Metoprolol  START: Carvedilol 12.5 mg take one tablet by mouth twice daily.  START: Amlodipine 5 mg take one tablet by mouth daily.  *If you need a refill on your cardiac medications before your next appointment, please call your pharmacy*   Lab Work: Your physician recommends that you return for lab work in: TODAY CBC, BMP, ProBNP If you have labs (blood work) drawn today and your tests are completely normal, you will receive your results only by: Marland Kitchen MyChart Message (if you have MyChart) OR . A paper copy in the mail If you have any lab test that is abnormal or we need to change your treatment, we will call you to review the results.   Testing/Procedures: None   Follow-Up: At [facility], you and your health needs are our priority.  As part of our continuing mission to provide you with exceptional heart care, we have created designated Provider Care Teams.  These Care Teams include your primary Cardiologist (physician) and Advanced Practice Providers (APPs -  Physician Assistants and Nurse Practitioners) who all work together to provide you with the care you need, when you need it.  We recommend signing up for the patient portal called "MyChart".  Sign up information is provided on this After Visit Summary.  MyChart is used to connect with patients for Virtual Visits (Telemedicine).  Patients are able to view lab/test results, encounter notes, upcoming appointments, etc.  Non-urgent messages can be sent to your provider as well.   To learn more about what you can do with MyChart, go to [email or web address].    Your next appointment:   4 week(s)  The format for your next appointment:   In Person  Provider:   [name], MD   Other Instructions

## 2020-06-11 LAB — BASIC METABOLIC PANEL
BUN/Creatinine Ratio: 14 (ref 10–24)
BUN: 14 mg/dL (ref 8–27)
CO2: 21 mmol/L (ref 20–29)
Calcium: 9.5 mg/dL (ref 8.6–10.2)
Chloride: 101 mmol/L (ref 96–106)
Creatinine, Ser: 0.99 mg/dL (ref 0.76–1.27)
Glucose: 119 mg/dL — ABNORMAL HIGH (ref 65–99)
Potassium: 4.6 mmol/L (ref 3.5–5.2)
Sodium: 138 mmol/L (ref 134–144)
eGFR: 78 mL/min/{1.73_m2} (ref >59)

## 2020-06-11 LAB — PRO B NATRIURETIC PEPTIDE: NT-Pro BNP: 2486 pg/mL — ABNORMAL HIGH (ref 0–486)

## 2020-06-11 LAB — CBC
Hematocrit: 34.8 % — ABNORMAL LOW (ref 37.5–51.0)
Hemoglobin: 10.7 g/dL — ABNORMAL LOW (ref 13.0–17.7)
MCH: 24.8 pg — ABNORMAL LOW (ref 26.6–33.0)
MCHC: 30.7 g/dL — ABNORMAL LOW (ref 31.5–35.7)
MCV: 81 fL (ref 79–97)
Platelets: 183 10*3/uL (ref 150–450)
RBC: 4.31 x10E6/uL (ref 4.14–5.80)
RDW: 16.2 % — ABNORMAL HIGH (ref 11.6–15.4)
WBC: 4.6 10*3/uL (ref 3.4–10.8)

## 2020-06-13 ENCOUNTER — Telehealth: Payer: Self-pay

## 2020-06-13 NOTE — Telephone Encounter (Signed)
Spoke with patient regarding results and recommendation.  Patient verbalizes understanding and is agreeable to plan of care. Advised patient to call back with any issues or concerns.  

## 2020-06-13 NOTE — Telephone Encounter (Signed)
-----   Message from Richardo Priest, MD sent at 06/11/2020 10:28 AM EDT ----- Stable results hemoglobin is unchanged

## 2020-06-15 ENCOUNTER — Telehealth: Payer: Self-pay | Admitting: Cardiology

## 2020-06-15 NOTE — Telephone Encounter (Signed)
Pt is returning Jasmine's phone call.Please advise

## 2020-06-15 NOTE — Telephone Encounter (Signed)
Spoke with patient, added to schedule 4/21 at 3 pm.

## 2020-06-15 NOTE — Telephone Encounter (Signed)
Patient called to say that he has had 2 procedure done in the last mont with Dr Curt Bears. And now he is in afib and is head is spinning. Patient is looking to get a appt for this week is out . Please advise

## 2020-06-15 NOTE — Telephone Encounter (Signed)
Woke up today dizzy, head is spinning. Used pulse ox, which he says has EKG capabilities that shows he is afib. HR is 85-90. No bp cuff at home.  Was at [facility] 2 days ago 140/82.  Feels chest pounding.  No SOB.  No home BP cuff.  Today is first day he felt bad since DCCV.  The dizziness/head spinning is not typical.  Notes with movement more than sitting still.   Doesn't usually feel this as an afib symptom.  Dr. Bettina Gavia changed his BP meds last week.    He is hoping to be seen before the weekend.    Reviewed with Dr. Curt Bears. Okay to take a diltiazem 30 mg.  Not sure if symptoms are bp related since he cannot check at home.  He is going to get a home cuff.  Adv to change positions slowly since he is dizzy w movement, and on Eliquis.  Recent CBC stable.  Aware to call back with any symptom changes or worsening.  Aware I will forward to Dr. Bettina Gavia as well to make aware and see if he can be seen sooner.

## 2020-06-15 NOTE — Telephone Encounter (Signed)
Tried to reach ut to patient. No answer at this time, left message for patient to return our call.

## 2020-06-15 NOTE — Telephone Encounter (Signed)
Patient will be added to schedule.

## 2020-06-16 ENCOUNTER — Ambulatory Visit (INDEPENDENT_AMBULATORY_CARE_PROVIDER_SITE_OTHER): Payer: Medicare Other | Admitting: Cardiology

## 2020-06-16 ENCOUNTER — Encounter: Payer: Self-pay | Admitting: Cardiology

## 2020-06-16 ENCOUNTER — Other Ambulatory Visit: Payer: Self-pay

## 2020-06-16 VITALS — BP 168/74 | HR 75 | Ht 70.0 in | Wt 180.0 lb

## 2020-06-16 DIAGNOSIS — I10 Essential (primary) hypertension: Secondary | ICD-10-CM | POA: Diagnosis not present

## 2020-06-16 MED ORDER — CARVEDILOL 12.5 MG PO TABS: 12.5000 mg | ORAL_TABLET | Freq: Two times a day (BID) | ORAL | 3 refills | Status: DC

## 2020-06-16 MED ORDER — HYDROCHLOROTHIAZIDE 12.5 MG PO CAPS: 12.5000 mg | ORAL_CAPSULE | Freq: Every day | ORAL | 3 refills | Status: DC

## 2020-06-16 NOTE — Patient Instructions (Addendum)
Medication Instructions:  Your physician recommends that you continue on your current medications as directed. Please refer to the Current Medication list given to you today. Please take the following medications as prescribed by Dr. Bettina Gavia: Carvedilol (Coreg), Valsartan (Diovan), Torsemide (Demadex), Amlopipine (Norvasc).  *If you need a refill on your cardiac medications before your next appointment, please call your pharmacy*   Lab Work: None If you have labs (blood work) drawn today and your tests are completely normal, you will receive your results only by: Marland Kitchen MyChart Message (if you have MyChart) OR . A paper copy in the mail If you have any lab test that is abnormal or we need to change your treatment, we will call you to review the results.   Testing/Procedures: None   Follow-Up: At [facility], you and your health needs are our priority.  As part of our continuing mission to provide you with exceptional heart care, we have created designated Provider Care Teams.  These Care Teams include your primary Cardiologist (physician) and Advanced Practice Providers (APPs -  Physician Assistants and Nurse Practitioners) who all work together to provide you with the care you need, when you need it.  We recommend signing up for the patient portal called "MyChart".  Sign up information is provided on this After Visit Summary.  MyChart is used to connect with patients for Virtual Visits (Telemedicine).  Patients are able to view lab/test results, encounter notes, upcoming appointments, etc.  Non-urgent messages can be sent to your provider as well.   To learn more about what you can do with MyChart, go to [email or web address].    Your next appointment:   4 week(s)  The format for your next appointment:   In Person  Provider:   [name], MD   Other Instructions

## 2020-06-16 NOTE — Progress Notes (Signed)
Cardiology Office Note:    Date:  06/16/2020   ID:  Charles Gum., DOB 1940-08-06, MRN [id number]  PCP:  Charles Lloyd, Charles Halsted, MD  Cardiologist:  No primary care provider on file.  Electrophysiologist:  Charles Meredith Leeds, MD   Referring MD: Charles Lloyd, Est[place]*   My blood pressure has been high  History of Present Illness:    Charles Bernardini. is a 80 y.o. male with a hx of persistentatrial fibrillation with initial ablation in 2015 at [facility], subsequently had cardioversion in 2017 and again repeat electrical cardioversion in September 2019, heart failure EF 40%, hypertension and hyperlipidemia. He is maintained on chronic anticoagulation with Eliquisreferred to EP for consideration of pulmonary vein isolation. He had pulmonary vein isolation again Dr. Curt Bears 03/12/2019 successfully resuming sinus rhythm. Ejection fraction 50 to 55%by echo in atrial fibrillation.  The patient last seen by Dr. Bettina Gavia on June 10, 2020 at that time his metoprolol was switched to carvedilol due to increasing blood pressure.  The patient was asked to continue on his ARB, and diuretic.    Unfortunately today the patient tells me that he had not been taking his valsartan and also does not take his torsemide he take his only what he feels is needed.  And he does not wish to take torsemide on a daily basis.  Past Medical History:  Diagnosis Date  . Acute tracheobronchitis 01/05/2019  . B12 deficiency   . 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 Cancer ([place])   . Cardiomyopathy ([place]) 03/10/2018   With chronic atrial fibrillation  . CHF (congestive heart failure) ([place]) 04/16/2013   Functional class II, ejection fraction 35-40%  Formatting of this note might be different from the original. Functional class II, ejection fraction 35-40%  . Chronic anticoagulation   . Chronic diastolic (congestive) heart failure (HCC) 04/16/2013   Functional class II, ejection fraction  35-40%  Last Assessment & Plan:  Clinically stable Volume well controlled meds reviewed  . Chronic diastolic CHF (congestive heart failure) ([place]) 04/16/2013   Functional class II, ejection fraction 35-40%  Last Assessment & Plan:  Clinically stable Volume well controlled meds reviewed  . Colon polyps 04/16/2013  . Diabetic neuropathy ([place])   . Eosinophil count raised 02/17/2019  . Essential hypertension 01/06/2010   Last Assessment & Plan:  Well controlled Continue med management  . 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  Steroid usage was discontinued several months ago.  Follow up with Dermatology as planned.  . Hypercholesterolemia 01/06/2010   Last Assessment & Plan:  Repeat labs recommended  . Hyperlipidemia associated with type 2 diabetes mellitus ([place]) 01/28/2018  . Hypertensive heart disease with heart failure ([place]) 01/06/2010   Last Assessment & Plan:  Well controlled Continue med management  . Hyponatremia 12/17/2018  . Infected prosthetic knee joint ([place]) 06/24/2017   Last Assessment & Plan:  Id following ?ongoing abx managementy reported Request records  . Infection of prosthetic right knee joint ([place])   . 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 Lesion of liver 04/20/2019   -On cardiac CT 02/2019  . Mild reactive airways disease   . 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 On amiodarone therapy 09/07/2013  . On continuous oral anticoagulation 01/28/2018  . Other activity(E029.9) 04/20/2013   Formatting of this note might be different from the original. Transthoracic Echocardiogram-03/10/2013-[facility]: Normal left ventricular wall thickness and cavity size.   Global left ventricular systolic function is moderately reduced.  The estimated ejection fraction is 35-40%.  The left atrium is moderately enlarged.  The right atrium is mildly enlarged.  No significant valvular   . Overweight (BMI 25.0-29.9) 10/22/2016  . Persistent atrial fibrillation ([place]) 04/16/2013   Last Assessment & Plan:  Rate controlled Continue med management eliquis for stroke preventino  Formatting of this note might be different from the original.  Drug  HX Current Rx Pre-ABL inefficacy Pre-ABL intolerant Post-ABL inefficacy Post-ABL intolerant max dose/24h M/Y end comments  sotalol                  dofetilide                  flecainide                  propafenone                  am  . S/P TKR (total knee replacement), bilateral   . Secondary hypercoagulable state ([place]) 04/09/2019  . Tinea cruris 04/16/2013  . Type 2 diabetes mellitus with diabetic polyneuropathy, without long-term current use of insulin ([place]) 04/16/2013   Last Assessment & Plan:  Labs today Pt reports well controlled on ambulatory monitoring  . Vitamin D deficiency 07/25/2018    Past Surgical History:  Procedure Laterality Date  . ATRIAL FIBRILLATION ABLATION N/A 03/12/2019   Procedure: ATRIAL FIBRILLATION ABLATION;  Surgeon: [name], MD;  Location: [place] CV LAB;  Service: Cardiovascular;  Laterality: N/A;  . ATRIAL FIBRILLATION ABLATION N/A 04/29/2020   Procedure: ATRIAL FIBRILLATION ABLATION;  Surgeon: [name], MD;  Location: [place] CV [facility];  Service: Cardiovascular;  Laterality: N/A;  . CARDIAC ELECTROPHYSIOLOGY STUDY AND ABLATION    . CARDIOVERSION    . CARDIOVERSION N/A 10/10/2018   Procedure: CARDIOVERSION;  Surgeon: [name], MD;  [facility] ENDOSCOPY;  Service: Cardiovascular;  Laterality: N/A;  . CARDIOVERSION N/A 05/18/2020   Procedure: CARDIOVERSION;  Surgeon: [name], MD;  Location: [place];  Service: Cardiovascular;  Laterality: N/A;  .  CHOLECYSTECTOMY    . PROSTATECTOMY    . REPLACEMENT TOTAL KNEE BILATERAL      Current Medications: Current Meds  Medication Sig  . amLODipine (NORVASC) 5 MG tablet Take 1 tablet (5 mg total) by mouth daily.  Marland Kitchen amoxicillin (AMOXIL) 500 MG capsule Take 2,000 mg by mouth See admin instructions. Take 4 capsules (2000 mg) by mouth 1 hour prior to dental procedures.  Marland Kitchen atorvastatin (LIPITOR) 20 MG tablet TAKE 1 TABLET DAILY AT 6 P.M.  . diltiazem (CARDIZEM) 30 MG tablet TAKE 1 TABLET EVERY 4 HOURS AS NEEDED FOR AFIB HEART RATE >100  . ELIQUIS 5 MG TABS tablet TAKE 1 TABLET TWICE A DAY  . fluticasone (FLONASE) 50 MCG/ACT nasal spray Place 2 sprays into both nostrils daily.  Marland Kitchen gabapentin (NEURONTIN) 300 MG capsule Take 2 capsules (600 mg total) by mouth at bedtime.  Marland Kitchen glucose blood (ACCU-CHEK AVIVA PLUS) test strip 1 each by Other route daily. Dx E11.9  . metFORMIN (GLUCOPHAGE) 500 MG tablet TAKE 1 TABLET TWICE A DAY  WITH MEALS  . montelukast (SINGULAIR) 10 MG tablet Take 10 mg by mouth at bedtime.   . potassium chloride SA (KLOR-CON M20) 20 MEQ tablet Take 1 tablet (20 mEq total) by mouth daily.  . RESTASIS 0.05 % ophthalmic emulsion Place 1 drop into both eyes 2 (two) times daily.  Marland Kitchen torsemide (DEMADEX) 20 MG tablet Take 20 mg by mouth daily as needed (fluid retention).  . valsartan (DIOVAN) 160 MG tablet Take 1 tablet (160 mg total) by mouth daily.  . [DISCONTINUED] carvedilol (COREG) 12.5 MG tablet Take 1 tablet (12.5 mg total) by mouth 2 (two) times daily.  . [DISCONTINUED] hydrochlorothiazide (MICROZIDE) 12.5 MG capsule Take 1 capsule (12.5 mg total) by mouth daily.     Allergies:   Patient has no known allergies.   Social History   Socioeconomic History  . Marital status: Widowed    Spouse name: Not on file  . Number of children: Not on file  . Years of education: Not on file  . Highest education level: Not on file  Occupational History  . Not on file  Tobacco Use  . Smoking  status: Never Smoker  . Smokeless tobacco: Never Used  Vaping Use  . Vaping Use: Never used  Substance and Sexual Activity  . Alcohol use: Yes    Alcohol/week: 4.0 standard drinks    Types: 2 Glasses of wine, 2 Standard drinks or equivalent per week    Comment: daily  . Drug use: Never  . Sexual activity: Not Currently  Other Topics Concern  . Not on file  Social History Narrative  . Not on file   Social Determinants of Health   Financial Resource Strain: Not on file  Food Insecurity: Not on file  Transportation Needs: Not on file  Physical Activity: Not on file  Stress: Not on file  Social Connections: Not on file     Family History: The patient's family history includes Cancer in his father and mother; Depression in his father and mother; Early death in his father and mother.  ROS:   Review of Systems  Constitution: Negative for decreased appetite, fever and weight gain.  HENT: Negative for congestion, ear discharge, hoarse voice and sore throat.   Eyes: Negative for discharge, redness, vision loss in right eye and visual halos.  Cardiovascular: Negative for chest pain, dyspnea on exertion, leg swelling, orthopnea and palpitations.  Respiratory: Negative for cough, hemoptysis, shortness of breath and snoring.   Endocrine: Negative for heat intolerance and polyphagia.  Hematologic/Lymphatic: Negative for bleeding problem. Does not bruise/bleed easily.  Skin: Negative for flushing, nail changes, rash and suspicious lesions.  Musculoskeletal: Negative for arthritis, joint pain, muscle cramps, myalgias, neck pain and stiffness.  Gastrointestinal: Negative for abdominal pain, bowel incontinence, diarrhea and excessive appetite.  Genitourinary: Negative for decreased libido, genital sores and incomplete emptying.  Neurological: Negative for brief paralysis, focal weakness, headaches and loss of balance.  Psychiatric/Behavioral: Negative for altered mental status, depression and  suicidal ideas.  Allergic/Immunologic: Negative for HIV exposure and persistent infections.    EKGs/Labs/Other Studies Reviewed:    The following studies were reviewed today:   EKG:  None today  Recent Labs: 01/29/2020: Magnesium 1.5; TSH 0.912 06/10/2020: BUN 14; Creatinine, Ser 0.99; Hemoglobin 10.7; NT-Pro BNP 2,486; Platelets 183; Potassium 4.6; Sodium 138  Recent Lipid Panel    Component Value Date/Time   CHOL 120 05/20/2020 0914   TRIG 73 05/20/2020 0914   HDL 46 05/20/2020 0914   CHOLHDL 2.6  05/20/2020 0914   CHOLHDL 3 07/24/2018 1058   VLDL 25.0 07/24/2018 1058   LDLCALC 59 05/20/2020 0914    Physical Exam:    VS:  BP (!) 168/74   Pulse 75   Ht 5\' 10"  (1.778 m)   Wt 180 lb (81.6 kg)   SpO2 98%   BMI 25.83 kg/m     Wt Readings from Last 3 Encounters:  06/16/20 180 lb (81.6 kg)  06/10/20 175 lb 0.6 oz (79.4 kg)  05/30/20 176 lb 12.8 oz (80.2 kg)     GEN: Well nourished, well developed in no acute distress HEENT: Normal NECK: No JVD; No carotid bruits LYMPHATICS: No lymphadenopathy CARDIAC: S1S2 noted,RRR, no murmurs, rubs, gallops RESPIRATORY:  Clear to auscultation without rales, wheezing or rhonchi  ABDOMEN: Soft, non-tender, non-distended, +bowel sounds, no guarding. EXTREMITIES: No edema, No cyanosis, no clubbing MUSCULOSKELETAL:  No deformity  SKIN: Warm and dry NEUROLOGIC:  Alert and oriented x 3, non-focal PSYCHIATRIC:  Normal affect, good insight  ASSESSMENT:    1. Hypertension, unspecified type    PLAN:     During our visit today I learned that the patient is not taking his medications for his blood pressure as prescribed.  He tells me that he is very anxious about taking all his medications as he does not want his blood pressure to drop.  Currently he is only taking the carvedilol 12.5 mg twice daily and the amlodipine 5 mg.  Explained to the patient that he [place] to be able to get good blood pressure result he needs to also take his  valsartan 60 mg daily and his torsemide as prescribed by Dr. Bettina Gavia.  He is very anxious about taking all of these medications I am not sure if the patient is actually going to take antihypertensive medication as prescribed.  But he states he is going to try.   The patient is in agreement with the above plan. The patient left the office in stable condition.  The patient Charles follow up in 4 weeks with Dr. Bettina Gavia.   Medication Adjustments/Labs and Tests Ordered: Current medicines are reviewed at length with the patient today.  Concerns regarding medicines are outlined above.  No orders of the defined types were placed in this encounter.  Meds ordered this encounter  Medications  . DISCONTD: hydrochlorothiazide (MICROZIDE) 12.5 MG capsule    Sig: Take 1 capsule (12.5 mg total) by mouth daily.    Dispense:  90 capsule    Refill:  3  . carvedilol (COREG) 12.5 MG tablet    Sig: Take 1 tablet (12.5 mg total) by mouth 2 (two) times daily.    Dispense:  180 tablet    Refill:  3    Patient Instructions  Medication Instructions:  Your physician recommends that you continue on your current medications as directed. Please refer to the Current Medication list given to you today. Please take the following medications as prescribed by Dr. Bettina Gavia: Carvedilol (Coreg), Valsartan (Diovan), Torsemide (Demadex), Amlopipine (Norvasc).  *If you need a refill on your cardiac medications before your next appointment, please call your pharmacy*   Lab Work: None If you have labs (blood work) drawn today and your tests are completely normal, you Charles receive your results only by: Marland Kitchen MyChart Message (if you have MyChart) OR . A paper copy in the mail If you have any lab test that is abnormal or we need to change your treatment, we Charles call you to review the results.  Testing/Procedures: None   Follow-Up: At [facility], you and your health needs are our priority.  As part of our continuing mission to  provide you with exceptional heart care, we have created designated Provider Care Teams.  These Care Teams include your primary Cardiologist (physician) and Advanced Practice Providers (APPs -  Physician Assistants and Nurse Practitioners) who all work together to provide you with the care you need, when you need it.  We recommend signing up for the patient portal called "MyChart".  Sign up information is provided on this After Visit Summary.  MyChart is used to connect with patients for Virtual Visits (Telemedicine).  Patients are able to view lab/test results, encounter notes, upcoming appointments, etc.  Non-urgent messages can be sent to your provider as well.   To learn more about what you can do with MyChart, go to [email or web address].    Your next appointment:   4 week(s)  The format for your next appointment:   In Person  Provider:   [name], MD   Other Instructions      Adopting a Healthy Lifestyle.  Know what a healthy weight is for you (roughly BMI <25) and aim to maintain this   Aim for 7+ servings of fruits and vegetables daily   65-80+ fluid ounces of water or unsweet tea for healthy kidneys   Limit to max 1 drink of alcohol per day; avoid smoking/tobacco   Limit animal fats in diet for cholesterol and heart health - choose grass fed whenever available   Avoid highly processed foods, and foods high in saturated/trans fats   Aim for low stress - take time to unwind and care for your mental health   Aim for 150 min of moderate intensity exercise weekly for heart health, and weights twice weekly for bone health   Aim for 7-9 hours of sleep daily   When it comes to diets, agreement about the perfect plan isnt easy to find, even among the experts. Experts at the [place] developed an idea known as the Healthy Eating Plate. Just imagine a plate divided into logical, healthy portions.   The emphasis is on diet quality:   Load up on  vegetables and fruits - one-half of your plate: Aim for color and variety, and remember that potatoes dont count.   Go for whole grains - one-quarter of your plate: Whole wheat, barley, wheat berries, quinoa, oats, brown rice, and foods made with them. If you want pasta, go with whole wheat pasta.   Protein power - one-quarter of your plate: Fish, chicken, beans, and nuts are all healthy, versatile protein sources. Limit red meat.   The diet, however, does go beyond the plate, offering a few other suggestions.   Use healthy plant oils, such as olive, canola, soy, corn, sunflower and peanut. Check the labels, and avoid partially hydrogenated oil, which have unhealthy trans fats.   If youre thirsty, drink water. Coffee and tea are good in moderation, but skip sugary drinks and limit milk and dairy products to one or two daily servings.   The type of carbohydrate in the diet is more important than the amount. Some sources of carbohydrates, such as vegetables, fruits, whole grains, and beans-are healthier than others.   Finally, stay active  Signed, Berniece Salines, DO  06/16/2020 5:31 PM    [facility]

## 2020-06-24 ENCOUNTER — Other Ambulatory Visit: Payer: Self-pay | Admitting: Internal Medicine

## 2020-06-30 ENCOUNTER — Other Ambulatory Visit: Payer: Self-pay | Admitting: Internal Medicine

## 2020-06-30 DIAGNOSIS — G6289 Other specified polyneuropathies: Secondary | ICD-10-CM

## 2020-06-30 NOTE — Telephone Encounter (Signed)
Office visit 05/24/20 -I believe it is time that he sees rheumatology again.  He is hesitant to schedule this visit as he has been in and out of doctor offices lately.   Okay to refill?

## 2020-07-08 ENCOUNTER — Ambulatory Visit (INDEPENDENT_AMBULATORY_CARE_PROVIDER_SITE_OTHER): Payer: Medicare Other | Admitting: Cardiology

## 2020-07-08 ENCOUNTER — Encounter: Payer: Self-pay | Admitting: Cardiology

## 2020-07-08 ENCOUNTER — Other Ambulatory Visit: Payer: Self-pay

## 2020-07-08 VITALS — BP 117/65 | HR 72 | Ht 70.0 in | Wt 176.0 lb

## 2020-07-08 DIAGNOSIS — D6869 Other thrombophilia: Secondary | ICD-10-CM

## 2020-07-08 DIAGNOSIS — I110 Hypertensive heart disease with heart failure: Secondary | ICD-10-CM | POA: Diagnosis not present

## 2020-07-08 DIAGNOSIS — I480 Paroxysmal atrial fibrillation: Secondary | ICD-10-CM

## 2020-07-08 NOTE — Patient Instructions (Signed)

## 2020-07-08 NOTE — Progress Notes (Signed)
Cardiology Office Note:    Date:  07/08/2020   ID:  Charles Gum., DOB 03-30-1940, MRN [id number]  PCP:  Isaac Bliss, [name], MD  Cardiologist:  [name], MD    Referring MD: Isaac Bliss, [place]*    ASSESSMENT:    1. Hypertensive heart disease with heart failure (HCC)   2. Paroxysmal atrial fibrillation ([place])   3. Secondary hypercoagulable state ([place])    PLAN:    In order of problems listed above:  1. Improved BP at target no fluid overload continue current antihypertensive agents and loop diuretic 2. Stable minimally symptomatic I told if he has recurrent atrial fibrillation in the future I would pursue rate control and anticoagulation. 3. Continue his anticoagulant   Next appointment: 6 months   Medication Adjustments/Labs and Tests Ordered: Current medicines are reviewed at length with the patient today.  Concerns regarding medicines are outlined above.  No orders of the defined types were placed in this encounter.  No orders of the defined types were placed in this encounter.   Chief Complaint  Patient presents with  . Follow-up  . Hypertension    History of Present Illness:    Charles Chenault. is a 80 y.o. male with a hx of atrial fibrillation with initial ablation 2015 at [facility] and most recently Dr. Curt Bears from 03/12/2019 ejection fraction 50 to 55% hypertension and hyper lipidemia last seen 06/08/2019.  He was seen by me in the office 06/10/2020 with poorly controlled hypertension and heart failure and placed on a loop diuretic and valsartan that he did not take at the time of his last visit.  His proBNP level was significantly elevated 2486.  Compliance with diet, lifestyle and medications: Yes  His son is present participates in evaluation decision making. He has no complaints of palpitation exercise intolerance chest pain or shortness of breath. He is concerned taking multiple antihypertensive agents Home blood pressure runs in the  range of 130/80. Said no bleeding from his anticoagulant No edema or shortness of breath Past Medical History:  Diagnosis Date  . Acute tracheobronchitis 01/05/2019  . B12 deficiency   . 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 Cancer ([place])   . Cardiomyopathy ([place]) 03/10/2018   With chronic atrial fibrillation  . CHF (congestive heart failure) ([place]) 04/16/2013   Functional class II, ejection fraction 35-40%  Formatting of this note might be different from the original. Functional class II, ejection fraction 35-40%  . Chronic anticoagulation   . Chronic diastolic (congestive) heart failure (HCC) 04/16/2013   Functional class II, ejection fraction 35-40%  Last Assessment & Plan:  Clinically stable Volume well controlled meds reviewed  . Chronic diastolic CHF (congestive heart failure) ([place]) 04/16/2013   Functional class II, ejection fraction 35-40%  Last Assessment & Plan:  Clinically stable Volume well controlled meds reviewed  . Colon polyps 04/16/2013  . Diabetic neuropathy ([place])   . Eosinophil count raised 02/17/2019  . Essential hypertension 01/06/2010   Last Assessment & Plan:  Well controlled Continue med management  . 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  Steroid usage was discontinued several months ago.  Follow up with Dermatology as planned.  . Hypercholesterolemia 01/06/2010   Last Assessment & Plan:  Repeat labs recommended  . Hyperlipidemia associated with type 2 diabetes mellitus ([place]) 01/28/2018  . Hypertensive heart disease with heart failure ([place]) 01/06/2010   Last Assessment &  Plan:  Well controlled Continue med management  . Hyponatremia 12/17/2018  . 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 Infection of prosthetic right knee joint ([place])   . Insomnia 03/04/2012   Grief with loss of wife 02/20/12   Last Assessment & Plan:  Improved sx  contineu xanax prn Se discussed  . Lesion of liver 04/20/2019   -On cardiac CT 02/2019  . Mild reactive airways disease   . 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 On amiodarone therapy 09/07/2013  . On continuous oral anticoagulation 01/28/2018  . Other activity(E029.9) 04/20/2013   Formatting of this note might be different from the original. Transthoracic Echocardiogram-03/10/2013-[facility]: Normal left ventricular wall thickness and cavity size.  Global left ventricular systolic function is moderately reduced.  The estimated ejection fraction is 35-40%.  The left atrium is moderately enlarged.  The right atrium is mildly enlarged.  No significant valvular   . Overweight (BMI 25.0-29.9) 10/22/2016  . Persistent atrial fibrillation ([place]) 04/16/2013   Last Assessment & Plan:  Rate controlled Continue med management eliquis for stroke preventino  Formatting of this note might be different from the original.  Drug  HX Current Rx Pre-ABL inefficacy Pre-ABL intolerant Post-ABL inefficacy Post-ABL intolerant max dose/24h M/Y end comments  sotalol                  dofetilide                  flecainide                  propafenone                  am  . S/P TKR (total knee replacement), bilateral   . Secondary hypercoagulable state ([place]) 04/09/2019  . Tinea cruris 04/16/2013  . Type 2 diabetes mellitus with diabetic polyneuropathy, without long-term current use of insulin ([place]) 04/16/2013   Last Assessment & Plan:  Labs today Pt reports well controlled on ambulatory monitoring  . Vitamin D deficiency 07/25/2018    Past Surgical History:  Procedure Laterality Date  . ATRIAL FIBRILLATION ABLATION N/A 03/12/2019   Procedure: ATRIAL FIBRILLATION ABLATION;  Surgeon: [name], MD;   Location: [place] [facility];  Service: Cardiovascular;  Laterality: N/A;  . ATRIAL FIBRILLATION ABLATION N/A 04/29/2020   Procedure: ATRIAL FIBRILLATION ABLATION;  Surgeon: [name], MD;  Location: [place] [facility];  Service: Cardiovascular;  Laterality: N/A;  . CARDIAC ELECTROPHYSIOLOGY STUDY AND ABLATION    . CARDIOVERSION    . CARDIOVERSION N/A 10/10/2018   Procedure: CARDIOVERSION;  Surgeon: [name], MD;  Location[facility] ENDOSCOPY;  Service: Cardiovascular;  Laterality: N/A;  . CARDIOVERSION N/A 05/18/2020   Procedure: CARDIOVERSION;  Surgeon: [name], MD;  Location: [place];  Service: Cardiovascular;  Laterality: N/A;  . CHOLECYSTECTOMY    . PROSTATECTOMY    . REPLACEMENT TOTAL KNEE BILATERAL      Current Medications: Current Meds  Medication Sig  . amLODipine (NORVASC) 5 MG tablet Take 1 tablet (5 mg total) by mouth daily.  Marland Kitchen amoxicillin (AMOXIL) 500 MG capsule Take 2,000 mg by mouth See admin instructions. Take 4 capsules (2000 mg) by  mouth 1 hour prior to dental procedures.  Marland Kitchen atorvastatin (LIPITOR) 20 MG tablet TAKE 1 TABLET DAILY AT 6 P.M.  . budesonide (PULMICORT) 1 MG/2ML nebulizer solution Take 1 mg by nebulization daily as needed for allergies.  . carvedilol (COREG) 12.5 MG tablet Take 1 tablet (12.5 mg total) by mouth 2 (two) times daily.  Marland Kitchen ELIQUIS 5 MG TABS tablet TAKE 1 TABLET TWICE A DAY  . fluticasone (FLONASE) 50 MCG/ACT nasal spray Place 2 sprays into both nostrils daily.  Marland Kitchen gabapentin (NEURONTIN) 300 MG capsule TAKE 2 CAPSULES BY MOUTH AT BEDTIME.  Marland Kitchen glucose blood (ACCU-CHEK AVIVA PLUS) test strip 1 each by Other route daily. Dx E11.9  . metFORMIN (GLUCOPHAGE) 500 MG tablet TAKE 1 TABLET TWICE A DAY WITH MEALS  . montelukast (SINGULAIR) 10 MG tablet Take 10 mg by mouth at bedtime.   . potassium chloride SA (KLOR-CON M20) 20 MEQ tablet Take 1 tablet (20 mEq total) by mouth daily.  . RESTASIS 0.05 % ophthalmic emulsion Place 1 drop into  both eyes 2 (two) times daily.  Marland Kitchen torsemide (DEMADEX) 20 MG tablet Take 20 mg by mouth daily as needed (fluid retention).  . valsartan (DIOVAN) 160 MG tablet Take 1 tablet (160 mg total) by mouth daily.     Allergies:   Patient has no known allergies.   Social History   Socioeconomic History  . Marital status: Widowed    Spouse name: Not on file  . Number of children: Not on file  . Years of education: Not on file  . Highest education level: Not on file  Occupational History  . Not on file  Tobacco Use  . Smoking status: Never Smoker  . Smokeless tobacco: Never Used  Vaping Use  . Vaping Use: Never used  Substance and Sexual Activity  . Alcohol use: Yes    Alcohol/week: 4.0 standard drinks    Types: 2 Glasses of wine, 2 Standard drinks or equivalent per week    Comment: daily  . Drug use: Never  . Sexual activity: Not Currently  Other Topics Concern  . Not on file  Social History Narrative  . Not on file   Social Determinants of Health   Financial Resource Strain: Not on file  Food Insecurity: Not on file  Transportation Needs: Not on file  Physical Activity: Not on file  Stress: Not on file  Social Connections: Not on file     Family History: The patient's family history includes Cancer in his father and mother; Depression in his father and mother; Early death in his father and mother. ROS:   Please see the history of present illness.    All other systems reviewed and are negative.  EKGs/Labs/Other Studies Reviewed:    The following studies were reviewed today:  EKG: EKG 05/30/2020 shows sinus rhythm  Recent Labs: 01/29/2020: Magnesium 1.5; TSH 0.912 06/10/2020: BUN 14; Creatinine, Ser 0.99; Hemoglobin 10.7; NT-Pro BNP 2,486; Platelets 183; Potassium 4.6; Sodium 138  Recent Lipid Panel    Component Value Date/Time   CHOL 120 05/20/2020 0914   TRIG 73 05/20/2020 0914   HDL 46 05/20/2020 0914   CHOLHDL 2.6 05/20/2020 0914   CHOLHDL 3 07/24/2018 1058    VLDL 25.0 07/24/2018 1058   LDLCALC 59 05/20/2020 0914    Physical Exam:    VS:  BP 117/65 (BP Location: Left Arm, Patient Position: Sitting, Cuff Size: Normal)   Pulse 72   Ht 5\' 10"  (1.778 m)   Wt 176  lb (79.8 kg)   SpO2 96%   BMI 25.25 kg/m     Wt Readings from Last 3 Encounters:  07/08/20 176 lb (79.8 kg)  06/16/20 180 lb (81.6 kg)  06/10/20 175 lb 0.6 oz (79.4 kg)     GEN:  Well nourished, well developed in no acute distress HEENT: Normal NECK: No JVD; No carotid bruits LYMPHATICS: No lymphadenopathy CARDIAC: RRR, no murmurs, rubs, gallops RESPIRATORY:  Clear to auscultation without rales, wheezing or rhonchi  ABDOMEN: Soft, non-tender, non-distended MUSCULOSKELETAL:  No edema; No deformity  SKIN: Warm and dry NEUROLOGIC:  Alert and oriented x 3 PSYCHIATRIC:  Normal affect    Signed, [name], MD  07/08/2020 1:57 PM    [facility]

## 2020-08-05 ENCOUNTER — Ambulatory Visit: Payer: Medicare Other | Admitting: Cardiology

## 2020-08-05 ENCOUNTER — Other Ambulatory Visit: Payer: Self-pay

## 2020-08-05 ENCOUNTER — Encounter: Payer: Self-pay | Admitting: Cardiology

## 2020-08-05 VITALS — BP 126/60 | HR 76 | Ht 70.0 in | Wt 180.2 lb

## 2020-08-05 DIAGNOSIS — I4819 Other persistent atrial fibrillation: Secondary | ICD-10-CM | POA: Diagnosis not present

## 2020-08-05 NOTE — Progress Notes (Signed)
Electrophysiology Office Note   Date:  08/05/2020   ID:  Charles Lloyd., DOB [birth year], MRN [id number]  PCP:  Charles Lloyd, Charles Halsted, MD  Cardiologist:  Charles Lloyd Primary Electrophysiologist:  Jakim Drapeau Charles Leeds, MD    Chief Complaint: atrial fibrillation initial   History of Present Illness: Charles Lloyd. is a 80 y.o. male who is being seen today for the evaluation of atrial fibrillation at the request of Charles Lloyd. Presenting today for electrophysiology evaluation.  He has a history of atrial fibrillation, moderate systolic heart failure, diabetes, hypertension, hyperlipidemia.  He had an ablation at [facility] in 2015 and a cardioversion 2017.  He had repeat ablation 03/12/2019.  Unfortunately went back into atrial fibrillation and is now status post ablation 04/29/2020.  Today, denies symptoms of palpitations, chest pain, shortness of breath, orthopnea, PND, lower extremity edema, claudication, dizziness, presyncope, syncope, bleeding, or neurologic sequela. The patient is tolerating medications without difficulties.  Since his ablation he has had Lloyd frequent episodes of atrial fibrillation.  He had a cardioversion 04/28/2020.  He is now back in sinus rhythm.  He is feeling well and is without major complaint.  Past Medical History:  Diagnosis Date   Acute tracheobronchitis 01/05/2019   B12 deficiency    Basal cell carcinoma (BCC) of left side of nose 01/28/2018   Cancer ([place])    Cardiomyopathy ([place]) 03/10/2018   With chronic atrial fibrillation   CHF (congestive heart failure) ([place]) 04/16/2013   Functional class II, ejection fraction 35-40%  Formatting of this note might be different from the original. Functional class II, ejection fraction 35-40%   Chronic anticoagulation    Chronic diastolic (congestive) heart failure ([place]) 04/16/2013   Functional class II, ejection fraction 35-40%  Last Assessment & Plan:  Clinically stable Volume well controlled meds reviewed   Chronic  diastolic CHF (congestive heart failure) ([place]) 04/16/2013   Functional class II, ejection fraction 35-40%  Last Assessment & Plan:  Clinically stable Volume well controlled meds reviewed   Colon polyps 04/16/2013   Diabetic neuropathy (HCC)    Eosinophil count raised 02/17/2019   Essential hypertension 01/06/2010   Last Assessment & Plan:  Well controlled Continue med management   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  Steroid usage was discontinued several months ago.  Follow up with Dermatology as planned.   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   Last Assessment & Plan:  Id following ?ongoing abx managementy reported Request records   Infection of prosthetic right knee joint ([place])    Insomnia 03/04/2012   Grief with loss of wife 02/20/12  Last Assessment & Plan:  Improved sx  contineu xanax prn Se discussed   Lesion of liver 04/20/2019   -On cardiac CT 02/2019   Mild reactive airways disease    Neoplasm of prostate 04/16/2013   Neoplasm of prostate, 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 Transthoracic Echocardiogram-03/10/2013-[facility]: Normal left ventricular wall thickness and cavity size.  Global left ventricular systolic function is moderately reduced.  The estimated ejection fraction is 35-40%.  The left  atrium is moderately enlarged.  The right atrium is mildly enlarged.  No significant valvular    Overweight (BMI 25.0-29.9) 10/22/2016   Persistent atrial fibrillation ([place]) 04/16/2013   Last Assessment & Plan:  Rate controlled Continue med management eliquis for stroke preventino  Formatting of this note might be different from the original.  Drug  HX Current Rx Pre-ABL inefficacy Pre-ABL intolerant Post-ABL inefficacy Post-ABL intolerant max dose/24h M/Y end comments  sotalol                  dofetilide                  flecainide                  propafenone                  am   S/P TKR (total knee replacement), bilateral    Secondary hypercoagulable state ([place]) 04/09/2019   Tinea cruris 04/16/2013   Type 2 diabetes mellitus with diabetic polyneuropathy, without long-term current use of insulin ([place]) 04/16/2013   Last Assessment & Plan:  Labs today Pt reports well controlled on ambulatory monitoring   Vitamin D deficiency 07/25/2018   Past Surgical History:  Procedure Laterality Date   ATRIAL FIBRILLATION ABLATION N/A 03/12/2019   Procedure: ATRIAL FIBRILLATION ABLATION;  Surgeon: Charles Haw, MD;  Location: [place] [facility];  Service: Cardiovascular;  Laterality: N/A;   ATRIAL FIBRILLATION ABLATION N/A 04/29/2020   Procedure: ATRIAL FIBRILLATION ABLATION;  Surgeon: Charles Haw, MD;  Location: [place] [facility];  Service: Cardiovascular;  Laterality: N/A;   CARDIAC ELECTROPHYSIOLOGY STUDY AND ABLATION     CARDIOVERSION     CARDIOVERSION N/A 10/10/2018   Procedure: CARDIOVERSION;  Surgeon: Charles Hector, MD;  Location: [facility];  Service: Cardiovascular;  Laterality: N/A;   CARDIOVERSION N/A 05/18/2020   Procedure: CARDIOVERSION;  Surgeon: Charles Headings, MD;  Location: [facility] ENDOSCOPY;  Service: Cardiovascular;  Laterality: N/A;   CHOLECYSTECTOMY     PROSTATECTOMY     REPLACEMENT TOTAL KNEE BILATERAL       Current Outpatient Medications  Medication Sig Dispense Refill    amLODipine (NORVASC) 5 MG tablet Take 1 tablet (5 mg total) by mouth daily. 90 tablet 3   amoxicillin (AMOXIL) 500 MG capsule Take 2,000 mg by mouth See admin instructions. Take 4 capsules (2000 mg) by mouth 1 hour prior to dental procedures.     atorvastatin (LIPITOR) 20 MG tablet TAKE 1 TABLET DAILY AT 6 P.M. 90 tablet 3   budesonide (PULMICORT) 1 MG/2ML nebulizer solution Take 1 mg by nebulization daily as needed for allergies.     carvedilol (COREG) 12.5 MG tablet Take 1 tablet (12.5 mg total) by mouth 2 (two) times daily. 180 tablet 3   ELIQUIS 5 MG TABS tablet TAKE 1 TABLET TWICE A DAY 180 tablet 3   fluticasone (FLONASE) 50 MCG/ACT nasal spray Place 2 sprays into both nostrils daily. 16 g 2   gabapentin (NEURONTIN) 300 MG capsule TAKE 2 CAPSULES BY MOUTH AT BEDTIME. 60 capsule 0   glucose blood (ACCU-CHEK AVIVA PLUS) test strip 1 each by Other route daily. Dx E11.9 100 each 12   metFORMIN (GLUCOPHAGE)  500 MG tablet TAKE 1 TABLET TWICE A DAY WITH MEALS 180 tablet 3   montelukast (SINGULAIR) 10 MG tablet Take 10 mg by mouth at bedtime.      potassium chloride SA (KLOR-CON M20) 20 MEQ tablet Take 1 tablet (20 mEq total) by mouth daily. 90 tablet 3   RESTASIS 0.05 % ophthalmic emulsion Place 1 drop into both eyes 2 (two) times daily.     torsemide (DEMADEX) 20 MG tablet Take 20 mg by mouth daily as needed (fluid retention).     valsartan (DIOVAN) 160 MG tablet Take 1 tablet (160 mg total) by mouth daily. 90 tablet 3   No current facility-administered medications for this visit.    Allergies:   Patient has no known allergies.   Social History:  The patient  reports that he has never smoked. He has never used smokeless tobacco. He reports current alcohol use of about 4.0 standard drinks of alcohol per week. He reports that he does not use drugs.   Family History:  The patient's family history includes Cancer in his father and mother; Depression in his father and mother; Early death in his  father and mother.   ROS:  Please see the history of present illness.   Otherwise, review of systems is positive for none.   All other systems are reviewed and negative.   PHYSICAL EXAM: VS:  BP 126/60   Pulse 76   Ht 5\' 10"  (1.778 m)   Wt 180 lb 3.2 oz (81.7 kg)   SpO2 95%   BMI 25.86 kg/m  , BMI Body mass index is 25.86 kg/m. GEN: Well nourished, well developed, in no acute distress  HEENT: normal  Neck: no JVD, carotid bruits, or masses Cardiac: RRR; no murmurs, rubs, or gallops,no edema  Respiratory:  clear to auscultation bilaterally, normal work of breathing GI: soft, nontender, nondistended, + BS MS: no deformity or atrophy  Skin: warm and dry Neuro:  Strength and sensation are intact Psych: euthymic mood, full affect  EKG:  EKG is ordered today. Personal review of the ekg ordered shows sinus rhythm, rate 76   Recent Labs: 01/29/2020: Magnesium 1.5; TSH 0.912 06/10/2020: BUN 14; Creatinine, Ser 0.99; Hemoglobin 10.7; NT-Pro BNP 2,486; Platelets 183; Potassium 4.6; Sodium 138    Lipid Panel     Component Value Date/Time   CHOL 120 05/20/2020 0914   TRIG 73 05/20/2020 0914   HDL 46 05/20/2020 0914   CHOLHDL 2.6 05/20/2020 0914   CHOLHDL 3 07/24/2018 1058   VLDL 25.0 07/24/2018 1058   LDLCALC 59 05/20/2020 0914     Wt Readings from Last 3 Encounters:  08/05/20 180 lb 3.2 oz (81.7 kg)  07/08/20 176 lb (79.8 kg)  06/16/20 180 lb (81.6 kg)      Other studies Reviewed: Additional studies/ records that were reviewed today include: TTE 10/07/18  Review of the above records today demonstrates:   1. The left ventricle has low normal systolic function, with an ejection fraction of 50-55%. The cavity size was normal. Left ventricular diastolic function could not be evaluated secondary to atrial fibrillation. No evidence of left ventricular  regional wall motion abnormalities.  2. The right ventricle has normal systolic function. The cavity was normal. There is no  increase in right ventricular wall thickness. Right ventricular systolic pressure is normal.  3. There is mild mitral annular calcification present.  4. The aortic valve is tricuspid. Moderate sclerosis of the aortic valve. Aortic valve regurgitation was not assessed  by color flow Doppler.  5. The aorta is normal in size and structure.  6. There is right bowing of the interatrial septum, suggestive of elevated left atrial pressure.  Myoview 05/25/19 Nuclear stress EF: 60%. No T wave inversion was noted during stress. There was no ST segment deviation noted during stress. This is a low risk study.   Normal perfusion. LVEF 60% with normal wall motion. This is a low risk study. No prior for comparison.  ASSESSMENT AND PLAN:  1.  Persistent atrial fibrillation: Currently on Eliquis.  Status post ablation 03/02/2019 with repeat ablation 04/29/2020.  CHA2DS2-VASc of 4.  He did go back into atrial fibrillation but had a cardioversion 04/28/2020.  He is in normal rhythm today.  We Kamren Heskett continue with current management.  2.  Chronic systolic heart failure: Ejection fraction has fortunately improved.  No obvious volume overload.    3.  Hypertension: Currently well controlled  4.  Hyperlipidemia: Continue statin per primary cardiology    Current medicines are reviewed at length with the patient today.   The patient does not have concerns regarding his medicines.  The following changes were made today: None  Labs/ tests ordered today include:  Orders Placed This Encounter  Procedures   EKG 12-Lead     Disposition:   FU with Arwyn Besaw 3 months  Signed, Tiphani Mells Charles Leeds, MD  08/05/2020 2:03 PM     [place] [address] [phone] (office) [phone] (fax)

## 2020-08-05 NOTE — Patient Instructions (Signed)
Medication Instructions:  Your physician recommends that you continue on your current medications as directed. Please refer to the Current Medication list given to you today.  *If you need a refill on your cardiac medications before your next appointment, please call your pharmacy*   Lab Work: None ordered If you have labs (blood work) drawn today and your tests are completely normal, you will receive your results only by: . MyChart Message (if you have MyChart) OR . A paper copy in the mail If you have any lab test that is abnormal or we need to change your treatment, we will call you to review the results.   Testing/Procedures: None ordered   Follow-Up: At [facility], you and your health needs are our priority.  As part of our continuing mission to provide you with exceptional heart care, we have created designated Provider Care Teams.  These Care Teams include your primary Cardiologist (physician) and Advanced Practice Providers (APPs -  Physician Assistants and Nurse Practitioners) who all work together to provide you with the care you need, when you need it.  We recommend signing up for the patient portal called "MyChart".  Sign up information is provided on this After Visit Summary.  MyChart is used to connect with patients for Virtual Visits (Telemedicine).  Patients are able to view lab/test results, encounter notes, upcoming appointments, etc.  Non-urgent messages can be sent to your provider as well.   To learn more about what you can do with MyChart, go to [email or web address].    Your next appointment:   3 month(s)  The format for your next appointment:   In Person  Provider:   Will Camnitz, MD   Thank you for choosing [facility]!!   Jonne Rote, RN [phone]    Other Instructions    

## 2020-08-10 ENCOUNTER — Other Ambulatory Visit: Payer: Self-pay | Admitting: Cardiology

## 2020-08-10 DIAGNOSIS — R0602 Shortness of breath: Secondary | ICD-10-CM

## 2020-08-10 MED ORDER — TORSEMIDE 20 MG PO TABS: 20.0000 mg | ORAL_TABLET | Freq: Every day | ORAL | 3 refills | Status: DC | PRN

## 2020-08-10 MED ORDER — POTASSIUM CHLORIDE CRYS ER 20 MEQ PO TBCR: 20.0000 meq | EXTENDED_RELEASE_TABLET | Freq: Every day | ORAL | 3 refills | Status: DC

## 2020-08-10 NOTE — Telephone Encounter (Signed)
This is Dr. Munley's pt 

## 2020-08-10 NOTE — Telephone Encounter (Signed)
*  STAT* If patient is at the pharmacy, call can be transferred to refill team.   1. Which medications need to be refilled? (please list name of each medication and dose if known) potassium chloride SA (KLOR-CON M20) 20 MEQ tablet torsemide (DEMADEX) 20 MG tablet  2. Which pharmacy/location (including street and city if local pharmacy) is medication to be sent to? [id number] - [place], [place]  3. Do they need a 30 day or 90 day supply? [place]

## 2020-09-13 ENCOUNTER — Other Ambulatory Visit: Payer: Self-pay | Admitting: Internal Medicine

## 2020-09-13 DIAGNOSIS — G6289 Other specified polyneuropathies: Secondary | ICD-10-CM

## 2020-09-15 ENCOUNTER — Other Ambulatory Visit: Payer: Self-pay | Admitting: Pulmonary Disease

## 2020-09-16 ENCOUNTER — Telehealth: Payer: Self-pay | Admitting: Pulmonary Disease

## 2020-09-16 MED ORDER — FLUTICASONE FUROATE-VILANTEROL 100-25 MCG/INH IN AEPB: 1.0000 | INHALATION_SPRAY | Freq: Every day | RESPIRATORY_TRACT | 0 refills | Status: DC

## 2020-09-16 NOTE — Telephone Encounter (Signed)
Called and spoke with patient regarding refill of Breo 100.  He states he had been taking it and then stopped for a couple of months.  He started coughing and wheezing again like he did before and [facility] and was told that his prescription expired.  He states he has been coughing and wheezing for a couple of days, the mucous is mostly clear.  Every once in a while he gets up a yellow/green lump.  He denies any fever, chills or body aches.  He states he feels good.  He just wants to restart the inhaler since he feels the symptoms coming back on him before it gets bad again.  Advised I would send in a refill to his pharmacy.  I let him know that he is due for an office visit and offered to schedule it while I had him on the phone, however, he was driving at the time of my call.  I let him know he would need to be seen in the office prior to any further refills. He verbalized understanding.  He stated he would call later to schedule a f/u with Dr. Elsworth Soho.  Nothing further needed.

## 2020-09-28 ENCOUNTER — Other Ambulatory Visit: Payer: Self-pay

## 2020-09-28 ENCOUNTER — Ambulatory Visit: Payer: Medicare Other | Admitting: Family Medicine

## 2020-09-28 ENCOUNTER — Encounter: Payer: Self-pay | Admitting: Family Medicine

## 2020-09-28 VITALS — BP 120/60 | HR 117 | Temp 98.4°F | Wt 183.3 lb

## 2020-09-28 DIAGNOSIS — L03032 Cellulitis of left toe: Secondary | ICD-10-CM | POA: Diagnosis not present

## 2020-09-28 MED ORDER — CEPHALEXIN 500 MG PO CAPS: 500.0000 mg | ORAL_CAPSULE | Freq: Four times a day (QID) | ORAL | 0 refills | Status: DC

## 2020-09-28 NOTE — Progress Notes (Signed)
Established Patient Office Visit  Subjective:  Patient ID: Charles Lloyd., male    DOB: 1940/04/10  Age: 80 y.o. MRN: [id number]  CC:  Chief Complaint  Patient presents with   Foot Pain    L foot, x 3 days, second toe, Hx of cellulitis in this foot per pt    HPI [name] presents for 3-day history of redness and soreness left second toe.  No injury.  He had very similar presentation back in May 2021.  At that time, he thought this was gout but we suspected cellulitis.  He was placed on Keflex and he states symptoms promptly improved and resolved within a few days of starting the antibiotic.  Denies any fevers or chills.  He has multiple chronic problems including history of A. fib, congestive heart failure, hypertension, type 2 diabetes which is controlled  Past Medical History:  Diagnosis Date   Acute tracheobronchitis 01/05/2019   B12 deficiency    Basal cell carcinoma (BCC) of left side of nose 01/28/2018   Cancer ([place])    Cardiomyopathy ([place]) 03/10/2018   With chronic atrial fibrillation   CHF (congestive heart failure) ([place]) 04/16/2013   Functional class II, ejection fraction 35-40%  Formatting of this note might be different from the original. Functional class II, ejection fraction 35-40%   Chronic anticoagulation    Chronic diastolic (congestive) heart failure ([place]) 04/16/2013   Functional class II, ejection fraction 35-40%  Last Assessment & Plan:  Clinically stable Volume well controlled meds reviewed   Chronic diastolic CHF (congestive heart failure) ([place]) 04/16/2013   Functional class II, ejection fraction 35-40%  Last Assessment & Plan:  Clinically stable Volume well controlled meds reviewed   Colon polyps 04/16/2013   Diabetic neuropathy (HCC)    Eosinophil count raised 02/17/2019   Essential hypertension 01/06/2010   Last Assessment & Plan:  Well controlled Continue med management   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  Steroid usage was discontinued several months ago.  Follow up with Dermatology as planned.   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   Last Assessment & Plan:  Id following ?ongoing abx managementy reported Request records   Infection of prosthetic right knee joint ([place])    Insomnia 03/04/2012   Grief with loss of wife 02/20/12  Last Assessment & Plan:  Improved sx  contineu xanax prn Se discussed   Lesion of liver 04/20/2019   -On cardiac CT 02/2019   Mild reactive airways disease    Neoplasm of prostate 04/16/2013   Neoplasm of prostate, malignant ([place]) 01/06/2010   [name] S/p radiation therapy   Last Assessment & Plan:  Repeat psa today No urinary sx Pt reported fatigue/poor appetite   On amiodarone therapy 09/07/2013   On continuous oral anticoagulation 01/28/2018   Other activity(E029.9) 04/20/2013   Formatting of this note might be different from the original. Transthoracic Echocardiogram-03/10/2013-[facility]: Normal left ventricular wall thickness and cavity size.  Global left ventricular systolic function is moderately reduced.  The estimated ejection fraction is 35-40%.  The left atrium is moderately enlarged.  The right atrium is  mildly enlarged.  No significant valvular    Overweight (BMI 25.0-29.9) 10/22/2016   Persistent atrial fibrillation ([place]) 04/16/2013   Last Assessment & Plan:  Rate controlled Continue med management eliquis for stroke preventino  Formatting of this note might be different from the original.  Drug  HX Current Rx Pre-ABL inefficacy Pre-ABL intolerant Post-ABL inefficacy Post-ABL intolerant max dose/24h M/Y  end comments  sotalol                  dofetilide                  flecainide                  propafenone                  am   S/P TKR (total knee replacement), bilateral    Secondary hypercoagulable state ([place]) 04/09/2019   Tinea cruris 04/16/2013   Type 2 diabetes mellitus with diabetic polyneuropathy, without long-term current use of insulin ([place]) 04/16/2013   Last Assessment & Plan:  Labs today Pt reports well controlled on ambulatory monitoring   Vitamin D deficiency 07/25/2018    Past Surgical History:  Procedure Laterality Date   ATRIAL FIBRILLATION ABLATION N/A 03/12/2019   Procedure: ATRIAL FIBRILLATION ABLATION;  Surgeon: [name], MD;  Location: [facility];  Service: Cardiovascular;  Laterality: N/A;   ATRIAL FIBRILLATION ABLATION N/A 04/29/2020   Procedure: ATRIAL FIBRILLATION ABLATION;  Surgeon: [name], MD;  Location: [facility];  Service: Cardiovascular;  Laterality: N/A;   CARDIAC ELECTROPHYSIOLOGY STUDY AND ABLATION     CARDIOVERSION     CARDIOVERSION N/A 10/10/2018   Procedure: CARDIOVERSION;  Surgeon: [name], MD;  Location: [facility] END[facility]COPY;  Service: Cardiovascular;  Laterality: N/A;   CARDIOVERSION N/A 05/18/2020   Procedure: CARDIOVERSION;  Surgeon: [name], MD;  Location: [facility] ENDOSCOPY;  Service: Cardiovascular;  Laterality: N/A;   CHOLECYSTECTOMY     PROSTATECTOMY     REPLACEMENT TOTAL KNEE BILATERAL      Family History  Problem Relation Age of Onset   Cancer Mother    Depression Mother    Early death Mother    Cancer Father    Depression Father    Early death Father     Social History   Socioeconomic History   Marital status: Widowed    Spouse name: Not on file   Number of children: Not on file   Years of education: Not on file   Highest education level: Not on file  Occupational History   Not on file  Tobacco Use   Smoking status: Never   Smokeless tobacco: Never  Vaping Use   Vaping Use: Never  used  Substance and Sexual Activity   Alcohol use: Yes    Alcohol/week: 4.0 standard drinks    Types: 2 Glasses of wine, 2 Standard drinks or equivalent per week    Comment: daily   Drug use: Never   Sexual activity: Not Currently  Other Topics Concern   Not on file  Social History Narrative   Not on file   Social Determinants of Health   Financial Resource Strain: Not on file  Food Insecurity: Not on file  Transportation Needs: Not on file  Physical Activity: Not on file  Stress: Not on file  Social Connections: Not on file  Intimate Partner Violence: Not on file    Outpatient Medications Prior to Visit  Medication Sig Dispense Refill   amoxicillin (AMOXIL) 500 MG capsule Take 2,000 mg by mouth See admin instructions. Take 4 capsules (2000 mg) by mouth 1 hour prior to dental procedures.     atorvastatin (LIPITOR) 20 MG tablet TAKE 1 TABLET DAILY AT 6 P.M. 90 tablet 3   budesonide (PULMICORT) 1 MG/2ML nebulizer solution Take 1 mg by nebulization daily as needed for allergies.     ELIQUIS 5 MG TABS tablet TAKE 1 TABLET TWICE A DAY 180 tablet 3   fluticasone (FLONASE) 50 MCG/ACT nasal spray Place 2 sprays into both nostrils daily. 16 g 2   fluticasone furoate-vilanterol (BREO ELLIPTA) 100-25 MCG/INH AEPB Inhale 1 puff into the lungs daily. 60 each 0   gabapentin (NEURONTIN) 300 MG capsule TAKE 2 CAPSULES BY MOUTH AT BEDTIME 60 capsule 1   glucose blood (ACCU-CHEK AVIVA PLUS) test strip 1 each by Other route daily. Dx E11.9 100 each 12   metFORMIN (GLUCOPHAGE) 500 MG tablet TAKE 1 TABLET TWICE A DAY WITH MEALS 180 tablet 3   montelukast (SINGULAIR) 10 MG tablet Take 10 mg by mouth at bedtime.      potassium chloride SA (KLOR-CON M20) 20 MEQ tablet Take 1 tablet (20 mEq total) by mouth daily. 90 tablet 3   RESTASIS 0.05 % ophthalmic emulsion Place 1 drop into both eyes 2 (two) times daily.     torsemide (DEMADEX) 20 MG tablet Take 1 tablet (20 mg total) by mouth daily as needed  (fluid retention). 90 tablet 3   valsartan (DIOVAN) 160 MG tablet Take 1 tablet (160 mg total) by mouth daily. 90 tablet 3   amLODipine (NORVASC) 5 MG tablet Take 1 tablet (5 mg total) by mouth daily. 90 tablet 3   carvedilol (COREG) 12.5 MG tablet Take 1 tablet (12.5 mg total) by mouth 2 (two) times daily. 180 tablet 3   No facility-administered medications prior to visit.    No Known Allergies  ROS Review of Systems  Constitutional:  Negative for chills and fever.     Objective:    Physical Exam Vitals reviewed.  Constitutional:      Appearance: Normal appearance.  Cardiovascular:     Comments: Irregularly irregular rhythm but rate controlled Pulmonary:     Effort: Pulmonary effort is normal.     Breath sounds: Normal breath sounds.  Skin:    Comments: He has erythema involving the dorsal aspect left second toe.  No obvious breaks in the skin.  Tender to palpation.  Minimal warmth.  No pustules.  No involvement of the foot. Good distal pulses  Neurological:     Mental Status: He is alert.    BP 120/60 (BP Location: Left Arm, Patient Position: Sitting, Cuff Size: Normal)   Pulse (!) 117   Temp 98.4 F (36.9 C) (Oral)   Wt 183 lb 4.8 oz (83.1 kg)   SpO2 98%   BMI 26.30 kg/m  Wt Readings from Last 3 Encounters:  09/28/20 183 lb 4.8 oz (83.1 kg)  08/05/20 180 lb 3.2 oz (81.7 kg)  07/08/20 176 lb (79.8 kg)     Health Maintenance Due  Topic Date Due   FOOT EXAM  Never done   Hepatitis C Screening  Never done   OPHTHALMOLOGY EXAM  01/09/2020   HEMOGLOBIN A1C  09/22/2020   INFLUENZA VACCINE  09/26/2020    There are no preventive care reminders to display for this patient.  Lab Results  Component Value Date   TSH 0.912 01/29/2020  Lab Results  Component Value Date   WBC 4.6 06/10/2020   HGB 10.7 (L) 06/10/2020   HCT 34.8 (L) 06/10/2020   MCV 81 06/10/2020   PLT 183 06/10/2020   Lab Results  Component Value Date   NA 138 06/10/2020   K 4.6 06/10/2020    CO2 21 06/10/2020   GLUCOSE 119 (H) 06/10/2020   BUN 14 06/10/2020   CREATININE 0.99 06/10/2020   BILITOT 1.1 01/06/2019   ALKPHOS 84 01/06/2019   AST 19 01/06/2019   ALT 21 01/06/2019   PROT 6.0 (L) 01/06/2019   ALBUMIN 3.5 01/06/2019   CALCIUM 9.5 06/10/2020   ANIONGAP 9 05/13/2020   EGFR 78 06/10/2020   GFR 71.06 09/22/2018   Lab Results  Component Value Date   CHOL 120 05/20/2020   Lab Results  Component Value Date   HDL 46 05/20/2020   Lab Results  Component Value Date   LDLCALC 59 05/20/2020   Lab Results  Component Value Date   TRIG 73 05/20/2020   Lab Results  Component Value Date   CHOLHDL 2.6 05/20/2020   Lab Results  Component Value Date   HGBA1C 6.6 (A) 03/25/2020      Assessment & Plan:   Problem List Items Addressed This Visit   None Visit Diagnoses     Cellulitis of second toe, left    -  Primary     Patient has very limited involvement left second toe.  Interestingly, had exact same presentation in May 2021 and responded promptly to antibiotics that time.  We will start Keflex 500 mg 4 times daily for 7 days.  Recommend frequent elevation.  Follow-up promptly for any fever or any progressive erythema  Meds ordered this encounter  Medications   cephALEXin (KEFLEX) 500 MG capsule    Sig: Take 1 capsule (500 mg total) by mouth 4 (four) times daily.    Dispense:  28 capsule    Refill:  0    Follow-up: No follow-ups on file.    Carolann Littler, MD

## 2020-10-03 ENCOUNTER — Other Ambulatory Visit: Payer: Self-pay

## 2020-10-03 ENCOUNTER — Ambulatory Visit (INDEPENDENT_AMBULATORY_CARE_PROVIDER_SITE_OTHER): Payer: Medicare Other | Admitting: Family Medicine

## 2020-10-03 VITALS — BP 120/58 | HR 63 | Temp 98.3°F | Wt 183.9 lb

## 2020-10-03 DIAGNOSIS — L03032 Cellulitis of left toe: Secondary | ICD-10-CM

## 2020-10-03 MED ORDER — DOXYCYCLINE HYCLATE 100 MG PO CAPS: 100.0000 mg | ORAL_CAPSULE | Freq: Two times a day (BID) | ORAL | 0 refills | Status: DC

## 2020-10-03 NOTE — Progress Notes (Signed)
Established Patient Office Visit  Subjective:  Patient ID: Charles Loftus., male    DOB: 02-27-1940  Age: 80 y.o. MRN: [id number]  CC:  Chief Complaint  Patient presents with   Foot Pain    L foot, seen last Wednesday and given an antibiotic, 2 days left, no improvement    HPI Charles Lloyd. presents for follow-up regarding left second toe pain and swelling.  Refer to last note for details.  He had similar occurrence in the past and this is improved immediately with Keflex.  Will prescribe the same and has not seen any improvement thus far.  No fevers or chills.  Still has pain, swelling, redness, warmth.  Denies any recent injury.  No prior history of gout.  He has not noted any breaks in the skin.  No ulceration.  He has 2 days left of Keflex.  Past Medical History:  Diagnosis Date   Acute tracheobronchitis 01/05/2019   B12 deficiency    Basal cell carcinoma (BCC) of left side of nose 01/28/2018   Cancer ([place])    Cardiomyopathy ([place]) 03/10/2018   With chronic atrial fibrillation   CHF (congestive heart failure) ([place]) 04/16/2013   Functional class II, ejection fraction 35-40%  Formatting of this note might be different from the original. Functional class II, ejection fraction 35-40%   Chronic anticoagulation    Chronic diastolic (congestive) heart failure ([place]) 04/16/2013   Functional class II, ejection fraction 35-40%  Last Assessment & Plan:  Clinically stable Volume well controlled meds reviewed   Chronic diastolic CHF (congestive heart failure) ([place]) 04/16/2013   Functional class II, ejection fraction 35-40%  Last Assessment & Plan:  Clinically stable Volume well controlled meds reviewed   Colon polyps 04/16/2013   Diabetic neuropathy (HCC)    Eosinophil count raised 02/17/2019   Essential hypertension 01/06/2010   Last Assessment & Plan:  Well controlled Continue med management   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  Steroid usage was discontinued several months ago.  Follow up with Dermatology as planned.   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   Last Assessment & Plan:  Id following ?ongoing abx managementy reported Request records   Infection of prosthetic right knee joint ([place])    Insomnia 03/04/2012   Grief with loss of wife 02/20/12  Last Assessment & Plan:  Improved sx  contineu xanax prn Se discussed   Lesion of liver 04/20/2019   -On cardiac CT 02/2019   Mild reactive airways disease    Neoplasm of prostate 04/16/2013   Neoplasm of prostate, malignant ([place]) 01/06/2010   [name] S/p radiation therapy   Last Assessment & Plan:  Repeat psa today No urinary sx Pt reported fatigue/poor appetite   On amiodarone therapy 09/07/2013   On continuous oral anticoagulation 01/28/2018   Other activity(E029.9) 04/20/2013   Formatting of this note might be different from the original. Transthoracic Echocardiogram-[facility]10/[facility]: Normal left ventricular wall thickness and cavity size.  Global left ventricular systolic function is moderately reduced.  The estimated ejection fraction is 35-40%.  The left atrium is moderately enlarged.  The right  atrium is mildly enlarged.  No significant valvular    Overweight (BMI 25.0-29.9) 10/22/2016   Persistent atrial fibrillation ([place]) 04/16/2013   Last Assessment & Plan:  Rate controlled Continue med management eliquis for stroke preventino  Formatting of this note might be different from the original.  Drug  HX Current Rx Pre-ABL inefficacy Pre-ABL intolerant Post-ABL inefficacy Post-ABL intolerant max dose/24h M/Y end comments   sotalol                  dofetilide                  flecainide                  propafenone                  am   S/P TKR (total knee replacement), bilateral    Secondary hypercoagulable state ([place]) 04/09/2019   Tinea cruris 04/16/2013   Type 2 diabetes mellitus with diabetic polyneuropathy, without long-term current use of insulin ([place]) 04/16/2013   Last Assessment & Plan:  Labs today Pt reports well controlled on ambulatory monitoring   Vitamin D deficiency 07/25/2018    Past Surgical History:  Procedure Laterality Date   ATRIAL FIBRILLATION ABLATION N/A 03/12/2019   Procedure: ATRIAL FIBRILLATION ABLATION;  Surgeon: [name], MD;  Location: [place] [facility];  Service: Cardiovascular;  Laterality: N/A;   ATRIAL FIBRILLATION ABLATION N/A 04/29/2020   Procedure: ATRIAL FIBRILLATION ABLATION;  Surgeon: [name], MD;  Location: [place] [facility];  Service: Cardiovascular;  Laterality: N/A;   CARDIAC ELECTROPHYSIOLOGY STUDY AND ABLATION     CARDIOVERSION     CARDIOVERSION N/A 10/10/2018   Procedure: CARDIOVERSION;  Surgeon: [name], MD;  Location: [facility] ENDOSCOPY;  Service: Cardiovascular;  Laterality: N/A;   CARDIOVERSION N/A 05/18/2020   Procedure: CARDIOVERSION;  Surgeon: [name], MD;  Location: [facility];  Service: Cardiovascular;  Laterality: N/A;   CHOLECYSTECTOMY     PROSTATECTOMY     REPLACEMENT TOTAL KNEE BILATERAL      Family History  Problem Relation Age of Onset   Cancer Mother    Depression Mother    Early death Mother    Cancer Father    Depression Father    Early death Father     Social History   Socioeconomic History   Marital status: Widowed    Spouse name: Not on file   Number of children: Not on file   Years of education: Not on file   Highest education level: Not on file  Occupational History   Not on file  Tobacco Use   Smoking status: Never   Smokeless tobacco: Never  Vaping Use   Vaping Use: Never used   Substance and Sexual Activity   Alcohol use: Yes    Alcohol/week: 4.0 standard drinks    Types: 2 Glasses of wine, 2 Standard drinks or equivalent per week    Comment: daily   Drug use: Never   Sexual activity: Not Currently  Other Topics Concern   Not on file  Social History Narrative   Not on file   Social Determinants of Health   Financial Resource Strain: Not on file  Food Insecurity: Not on file  Transportation Needs: Not on file  Physical Activity: Not on file  Stress: Not on file  Social Connections: Not on file  Intimate Partner Violence: Not on file    Outpatient Medications Prior to  Visit  Medication Sig Dispense Refill   amoxicillin (AMOXIL) 500 MG capsule Take 2,000 mg by mouth See admin instructions. Take 4 capsules (2000 mg) by mouth 1 hour prior to dental procedures.     atorvastatin (LIPITOR) 20 MG tablet TAKE 1 TABLET DAILY AT 6 P.M. 90 tablet 3   budesonide (PULMICORT) 1 MG/2ML nebulizer solution Take 1 mg by nebulization daily as needed for allergies.     ELIQUIS 5 MG TABS tablet TAKE 1 TABLET TWICE A DAY 180 tablet 3   fluticasone (FLONASE) 50 MCG/ACT nasal spray Place 2 sprays into both nostrils daily. 16 g 2   fluticasone furoate-vilanterol (BREO ELLIPTA) 100-25 MCG/INH AEPB Inhale 1 puff into the lungs daily. 60 each 0   gabapentin (NEURONTIN) 300 MG capsule TAKE 2 CAPSULES BY MOUTH AT BEDTIME 60 capsule 1   glucose blood (ACCU-CHEK AVIVA PLUS) test strip 1 each by Other route daily. Dx E11.9 100 each 12   metFORMIN (GLUCOPHAGE) 500 MG tablet TAKE 1 TABLET TWICE A DAY WITH MEALS 180 tablet 3   montelukast (SINGULAIR) 10 MG tablet Take 10 mg by mouth at bedtime.      potassium chloride SA (KLOR-CON M20) 20 MEQ tablet Take 1 tablet (20 mEq total) by mouth daily. 90 tablet 3   RESTASIS 0.05 % ophthalmic emulsion Place 1 drop into both eyes 2 (two) times daily.     torsemide (DEMADEX) 20 MG tablet Take 1 tablet (20 mg total) by mouth daily as needed (fluid  retention). 90 tablet 3   valsartan (DIOVAN) 160 MG tablet Take 1 tablet (160 mg total) by mouth daily. 90 tablet 3   cephALEXin (KEFLEX) 500 MG capsule Take 1 capsule (500 mg total) by mouth 4 (four) times daily. 28 capsule 0   amLODipine (NORVASC) 5 MG tablet Take 1 tablet (5 mg total) by mouth daily. 90 tablet 3   carvedilol (COREG) 12.5 MG tablet Take 1 tablet (12.5 mg total) by mouth 2 (two) times daily. 180 tablet 3   No facility-administered medications prior to visit.    No Known Allergies  ROS Review of Systems  Constitutional:  Negative for chills and fever.     Objective:    Physical Exam Vitals reviewed.  Constitutional:      Appearance: Normal appearance. He is not ill-appearing or toxic-appearing.  Cardiovascular:     Rate and Rhythm: Normal rate.  Skin:    Comments: Left second toe reveals some diffuse swelling and erythema and warmth.  This does not spread to the foot or involve the distal aspect of the toe.  No ulcerations.  Good capillary refill.  Neurological:     Mental Status: He is alert.    BP (!) 120/58 (BP Location: Left Arm, Patient Position: Sitting, Cuff Size: Normal)   Pulse 63   Temp 98.3 F (36.8 C) (Oral)   Wt 183 lb 14.4 oz (83.4 kg)   SpO2 96%   BMI 26.39 kg/m  Wt Readings from Last 3 Encounters:  10/03/20 183 lb 14.4 oz (83.4 kg)  09/28/20 183 lb 4.8 oz (83.1 kg)  08/05/20 180 lb 3.2 oz (81.7 kg)     Health Maintenance Due  Topic Date Due   FOOT EXAM  Never done   Hepatitis C Screening  Never done   OPHTHALMOLOGY EXAM  01/09/2020   HEMOGLOBIN A1C  09/22/2020   INFLUENZA VACCINE  09/26/2020    There are no preventive care reminders to display for this patient.  Lab Results  Component Value Date   TSH 0.912 01/29/2020   Lab Results  Component Value Date   WBC 4.6 06/10/2020   HGB 10.7 (L) 06/10/2020   HCT 34.8 (L) 06/10/2020   MCV 81 06/10/2020   PLT 183 06/10/2020   Lab Results  Component Value Date   NA 138  06/10/2020   K 4.6 06/10/2020   CO2 21 06/10/2020   GLUCOSE 119 (H) 06/10/2020   BUN 14 06/10/2020   CREATININE 0.99 06/10/2020   BILITOT 1.1 01/06/2019   ALKPHOS 84 01/06/2019   AST 19 01/06/2019   ALT 21 01/06/2019   PROT 6.0 (L) 01/06/2019   ALBUMIN 3.5 01/06/2019   CALCIUM 9.5 06/10/2020   ANIONGAP 9 05/13/2020   EGFR 78 06/10/2020   GFR 71.06 09/22/2018   Lab Results  Component Value Date   CHOL 120 05/20/2020   Lab Results  Component Value Date   HDL 46 05/20/2020   Lab Results  Component Value Date   LDLCALC 59 05/20/2020   Lab Results  Component Value Date   TRIG 73 05/20/2020   Lab Results  Component Value Date   CHOLHDL 2.6 05/20/2020   Lab Results  Component Value Date   HGBA1C 6.6 (A) 03/25/2020      Assessment & Plan:   Problem List Items Addressed This Visit   None Visit Diagnoses     Cellulitis of second toe of left foot    -  Primary     Probable cellulitis changes left second toe.  Not responding to Keflex.  We will add doxycycline 100 mg twice daily for MRSA coverage.  As we discussed previously gout, though possible, would seem unlikely as he has never had gout issues up to this point.  Touch base if not seeing some improvement in 3 to 4 days and sooner as needed  Meds ordered this encounter  Medications   doxycycline (VIBRAMYCIN) 100 MG capsule    Sig: Take 1 capsule (100 mg total) by mouth 2 (two) times daily.    Dispense:  20 capsule    Refill:  0    Follow-up: No follow-ups on file.    Carolann Littler, MD

## 2020-10-03 NOTE — Patient Instructions (Signed)
Let me know in 3-4 days if not improving- less redness, swelling, pain.

## 2020-10-05 ENCOUNTER — Ambulatory Visit (INDEPENDENT_AMBULATORY_CARE_PROVIDER_SITE_OTHER): Payer: Medicare Other | Admitting: Adult Health

## 2020-10-05 ENCOUNTER — Other Ambulatory Visit: Payer: Self-pay

## 2020-10-05 ENCOUNTER — Encounter: Payer: Self-pay | Admitting: Adult Health

## 2020-10-05 DIAGNOSIS — J309 Allergic rhinitis, unspecified: Secondary | ICD-10-CM | POA: Diagnosis not present

## 2020-10-05 DIAGNOSIS — J4530 Mild persistent asthma, uncomplicated: Secondary | ICD-10-CM | POA: Diagnosis not present

## 2020-10-05 MED ORDER — ALBUTEROL SULFATE HFA 108 (90 BASE) MCG/ACT IN AERS: 1.0000 | INHALATION_SPRAY | Freq: Four times a day (QID) | RESPIRATORY_TRACT | 2 refills | Status: DC | PRN

## 2020-10-05 MED ORDER — FLUTICASONE FUROATE-VILANTEROL 100-25 MCG/INH IN AEPB: 1.0000 | INHALATION_SPRAY | Freq: Every day | RESPIRATORY_TRACT | 11 refills | Status: DC

## 2020-10-05 NOTE — Patient Instructions (Signed)
Continue on BREO 1 puff daily , rinse after use.  Albuterol inhaler 1-2 puffs every 6hr as needed  Singulair '10mg'$  daily  Zyrtec '10mg'$  At bedtime  As needed  drainage . Follow up with Dr. Elsworth Soho  in 1 year and As needed

## 2020-10-05 NOTE — Progress Notes (Signed)
$'@Patient'g$  ID: Charles Gum., male    DOB: 1940/03/27, 80 y.o.   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  History of A. fib and congestive heart failure with previous ablation at [facility] in 2015, cardioversion 2017, repeat ablation January 2021 and April 29, 2020   TEST/EVENTS :  CT cardiac 02/2019 -clear lungs, low-attenuation lesions in the liver   Echo 09/2018 showed normal EF Absolute eosinophil count 1700 on 11/9, 1800 on 10/21, 400 on 07/24/18  10/2019 IgE 457 , RAST + dues, cat, dog , ragweed, oak, grass   10/05/20 Follow up : Allergic Asthma  Patient returns follow-up visit.  Patient has underlying asthma with allergic phenotype.  He has elevated IgE and eosinophils.  Patient has been maintained on Breo and Singulair.  Patient says he has been doing well.  He was doing well and stopped his Breo for several months.  However last month started and noted that he was starting to have more symptoms with cough and intermittent wheezing.  He restarted his Breo and since then symptoms have much improved.  He said within 2 days that his cough and wheezing decreased substantially.  Patient says he remains on Breo daily.  He is also on Singulair.  Patient denies any flare of nasal congestion or drainage. Patient says he remains active.  Denies any decrease in activity tolerance.  No increased albuterol use.   No Known Allergies  Immunization History  Administered Date(s) Administered   Fluad Quad(high Dose 65+) 11/25/2018, 03/25/2020   Influenza, High Dose Seasonal PF 11/21/2010, 01/19/2013, 02/01/2014, 02/07/2015, 12/28/2015, 10/22/2016, 01/28/2018   Influenza,trivalent, recombinat, inj, PF 01/11/2012   Influenza-Unspecified 12/09/2009   PFIZER(Purple Top)SARS-COV-2  Vaccination 04/19/2019, 05/13/2019, 11/21/2019   Pneumococcal Conjugate-13 01/19/2013   Pneumococcal Polysaccharide-23 12/01/2008   Tdap 10/28/2006   Zoster Recombinat (Shingrix) 01/28/2018, 07/24/2018   Zoster, Live 10/28/2006    Past Medical History:  Diagnosis Date   Acute tracheobronchitis 01/05/2019   B12 deficiency    Basal cell carcinoma (BCC) of left side of nose 01/28/2018   Cancer ([place])    Cardiomyopathy ([place]) 03/10/2018   With chronic atrial fibrillation   CHF (congestive heart failure) ([place]) 04/16/2013   Functional class II, ejection fraction 35-40%  Formatting of this note might be different from the original. Functional class II, ejection fraction 35-40%   Chronic anticoagulation    Chronic diastolic (congestive) heart failure ([place]) 04/16/2013   Functional class II, ejection fraction 35-40%  Last Assessment & Plan:  Clinically stable Volume well controlled meds reviewed   Chronic diastolic CHF (congestive heart failure) ([place]) 04/16/2013   Functional class II, ejection fraction 35-40%  Last Assessment & Plan:  Clinically stable Volume well controlled meds reviewed   Colon polyps 04/16/2013   Diabetic neuropathy (HCC)    Eosinophil count raised 02/17/2019   Essential hypertension 01/06/2010   Last Assessment & Plan:  Well controlled Continue med management   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  Steroid usage was discontinued several months ago.  Follow up with Dermatology as planned.   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   Last Assessment & Plan:  Id following ?ongoing abx managementy reported  Request records   Infection of prosthetic right knee joint ([place])    Insomnia 03/04/2012   Grief with loss of wife 02/20/12  Last Assessment & Plan:  Improved sx  contineu xanax prn Se discussed   Lesion of liver 04/20/2019   -On cardiac CT 02/2019   Mild reactive airways disease    Neoplasm of prostate 04/16/2013   Neoplasm of prostate, 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 Transthoracic Echocardiogram-03/10/[facility]: Normal left ventricular wall thickness and cavity size.  Global left ventricular systolic function is moderately reduced.  The estimated ejection fraction is 35-40%.  The left atrium is moderately enlarged.  The right atrium is mildly enlarged.  No significant valvular    Overweight (BMI 25.0-29.9) 10/22/2016   Persistent atrial fibrillation ([place]) 04/16/2013   Last Assessment & Plan:  Rate controlled Continue med management eliquis for stroke preventino  Formatting of this note might be different from the original.  Drug  HX Current Rx Pre-ABL inefficacy Pre-ABL intolerant Post-ABL inefficacy Post-ABL intolerant max dose/24h M/Y end comments  sotalol                  dofetilide                  flecainide                  propafenone                  am   S/P TKR (total knee replacement), bilateral    Secondary hypercoagulable state ([place]) 04/09/2019   Tinea cruris 04/16/2013   Type 2 diabetes mellitus with diabetic polyneuropathy, without long-term current use of insulin ([place]) 04/16/2013   Last Assessment & Plan:  Labs today Pt reports well controlled on ambulatory monitoring   Vitamin D deficiency 07/25/2018    Tobacco History: Social History   Tobacco Use  Smoking Status  Never  Smokeless Tobacco Never   Counseling given: Not Answered   Outpatient Medications Prior to Visit  Medication Sig Dispense Refill   amoxicillin (AMOXIL) 500 MG capsule Take 2,000 mg by mouth See admin instructions. Take 4 capsules (2000 mg) by mouth 1 hour prior to dental procedures.     atorvastatin (LIPITOR) 20 MG tablet TAKE 1 TABLET DAILY AT 6 P.M. 90 tablet 3   budesonide (PULMICORT) 1 MG/2ML nebulizer solution Take 1 mg by nebulization daily as needed for allergies.     doxycycline (VIBRAMYCIN) 100 MG capsule Take 1 capsule (100 mg total) by mouth 2 (two) times daily. 20 capsule 0   ELIQUIS 5 MG TABS tablet TAKE 1 TABLET TWICE A DAY 180 tablet 3   fluticasone (FLONASE) 50 MCG/ACT nasal spray Place 2 sprays into both nostrils daily. 16 g 2   fluticasone furoate-vilanterol (BREO ELLIPTA) 100-25 MCG/INH AEPB Inhale 1 puff into the lungs daily. 60 each 0   gabapentin (NEURONTIN) 300 MG capsule TAKE 2 CAPSULES BY MOUTH AT BEDTIME 60 capsule 1  glucose blood (ACCU-CHEK AVIVA PLUS) test strip 1 each by Other route daily. Dx E11.9 100 each 12   metFORMIN (GLUCOPHAGE) 500 MG tablet TAKE 1 TABLET TWICE A DAY WITH MEALS 180 tablet 3   montelukast (SINGULAIR) 10 MG tablet Take 10 mg by mouth at bedtime.      potassium chloride SA (KLOR-CON M20) 20 MEQ tablet Take 1 tablet (20 mEq total) by mouth daily. 90 tablet 3   RESTASIS 0.05 % ophthalmic emulsion Place 1 drop into both eyes 2 (two) times daily.     torsemide (DEMADEX) 20 MG tablet Take 1 tablet (20 mg total) by mouth daily as needed (fluid retention). 90 tablet 3   valsartan (DIOVAN) 160 MG tablet Take 1 tablet (160 mg total) by mouth daily. 90 tablet 3   amLODipine (NORVASC) 5 MG tablet Take 1 tablet (5 mg total) by mouth daily. 90 tablet 3   carvedilol (COREG) 12.5 MG tablet Take 1 tablet (12.5 mg total) by mouth 2 (two) times daily. 180 tablet 3   No facility-administered medications prior to visit.     Review of Systems:    Constitutional:   No  weight loss, night sweats,  Fevers, chills, fatigue, or  lassitude.  HEENT:   No headaches,  Difficulty swallowing,  Tooth/dental problems, or  Sore throat,                No sneezing, itching, ear ache, nasal congestion, post nasal drip,   CV:  No chest pain,  Orthopnea, PND, swelling in lower extremities, anasarca, dizziness, palpitations, syncope.   GI  No heartburn, indigestion, abdominal pain, nausea, vomiting, diarrhea, change in bowel habits, loss of appetite, bloody stools.   Resp:  No non-productive cough,  No coughing up of blood.  No change in color of mucus.  No wheezing.  No chest wall deformity  Skin: no rash or lesions.  GU: no dysuria, change in color of urine, no urgency or frequency.  No flank pain, no hematuria   MS:  No joint pain or swelling.  No decreased range of motion.  No back pain.    Physical Exam  BP 120/60 (BP Location: Left Arm, Patient Position: Sitting, Cuff Size: Normal)   Pulse 100   Temp 98.2 F (36.8 C) (Oral)   Ht 5' 10.5" (1.791 m)   Wt 183 lb 6.4 oz (83.2 kg)   SpO2 100%   BMI 25.94 kg/m   GEN: A/Ox3; pleasant , NAD, well nourished    HEENT:  [place]/AT,   NOSE-clear, THROAT-clear, no lesions, no postnasal drip or exudate noted.   NECK:  Supple w/ fair ROM; no JVD; normal carotid impulses w/o bruits; no thyromegaly or nodules palpated; no lymphadenopathy.    RESP  Clear  P & A; w/o, wheezes/ rales/ or rhonchi. no accessory muscle use, no dullness to percussion  CARD:  RRR, no m/r/g, no peripheral edema, pulses intact, no cyanosis or clubbing.  GI:   Soft & nt; nml bowel sounds; no organomegaly or masses detected.   Musco: Warm bil, no deformities or joint swelling noted.   Neuro: alert, no focal deficits noted.    Skin: Warm, no lesions or rashes    Lab Results:  CBC  BMET    No results found.    No flowsheet data found.  No results found for: NITRICOXIDE      Assessment & Plan:   No  problem-specific Assessment & Plan notes found for this encounter.  Charles Edison, NP 10/05/2020

## 2020-10-06 ENCOUNTER — Telehealth: Payer: Self-pay | Admitting: Pulmonary Disease

## 2020-10-06 NOTE — Telephone Encounter (Signed)
disregard

## 2020-10-06 NOTE — Assessment & Plan Note (Signed)
Asthma with allergic phenotype.  Patient is to continue on Breo.  Improved symptom control maintenance therapy.  Continue on Singulair daily.  May use Zyrtec if needed.  Trigger prevention.  Plan  Patient Instructions  Continue on BREO 1 puff daily , rinse after use.  Albuterol inhaler 1-2 puffs every 6hr as needed  Singulair '10mg'$  daily  Zyrtec '10mg'$  At bedtime  As needed  drainage . Follow up with Dr. Elsworth Soho  in 1 year and As needed

## 2020-10-06 NOTE — Assessment & Plan Note (Signed)
Continue on Singulair daily.  Add Zyrtec if needed.

## 2020-10-13 ENCOUNTER — Telehealth: Payer: Self-pay

## 2020-10-13 NOTE — Telephone Encounter (Signed)
Message sent to Dr Elease Hashimoto CMA

## 2020-10-14 MED ORDER — CEPHALEXIN 500 MG PO CAPS: 500.0000 mg | ORAL_CAPSULE | Freq: Three times a day (TID) | ORAL | 0 refills | Status: DC

## 2020-10-14 NOTE — Telephone Encounter (Signed)
Spoke with the patient. He stated he finished the keflex 2 days ago. It did help and has mostly cleared up but is still very tender.

## 2020-10-14 NOTE — Telephone Encounter (Signed)
Rx sent in. Patient has been made aware.

## 2020-10-14 NOTE — Addendum Note (Signed)
Addended by: Rebecca Eaton on: 10/14/2020 01:07 PM   Modules accepted: Orders

## 2020-11-10 ENCOUNTER — Other Ambulatory Visit: Payer: Self-pay | Admitting: Internal Medicine

## 2020-11-10 DIAGNOSIS — G6289 Other specified polyneuropathies: Secondary | ICD-10-CM

## 2020-11-21 ENCOUNTER — Other Ambulatory Visit: Payer: Self-pay

## 2020-11-21 ENCOUNTER — Encounter: Payer: Self-pay | Admitting: Cardiology

## 2020-11-21 ENCOUNTER — Ambulatory Visit (INDEPENDENT_AMBULATORY_CARE_PROVIDER_SITE_OTHER): Payer: Medicare Other | Admitting: Cardiology

## 2020-11-21 VITALS — BP 118/64 | HR 113 | Resp 18 | Ht 70.0 in | Wt 184.0 lb

## 2020-11-21 DIAGNOSIS — I4819 Other persistent atrial fibrillation: Secondary | ICD-10-CM

## 2020-11-21 MED ORDER — FLECAINIDE ACETATE 100 MG PO TABS: 100.0000 mg | ORAL_TABLET | Freq: Two times a day (BID) | ORAL | 3 refills | Status: DC

## 2020-11-21 NOTE — H&P (View-Only) (Signed)
Electrophysiology Office Note   Date:  11/21/2020   ID:  Charles Lloyd., DOB Dec 20, 1940, MRN [id number]  PCP:  Isaac Bliss, [name], MD  Cardiologist:  Bettina Gavia Primary Electrophysiologist:  Airyana Sprunger Meredith Leeds, MD    Chief Complaint: atrial fibrillation    History of Present Illness: Charles Lloyd. is a 80 y.o. male who is being seen today for the evaluation of atrial fibrillation at the request of Charles Lloyd. Presenting today for electrophysiology evaluation.  He has a history of atrial fibrillation, moderate systolic heart failure, diabetes, hyperlipidemia.  He had ablation at [facility] in 2015 and cardioversion 2017.  He had repeat ablation 03/12/2019.  Unfortunately went back into atrial fibrillation status post ablation 04/29/20  Today, denies symptoms of palpitations, chest pain, shortness of breath, orthopnea, PND, lower extremity edema, claudication, dizziness, presyncope, syncope, bleeding, or neurologic sequela. The patient is tolerating medications without difficulties.  Unfortunately has gone back into atrial fibrillation.  He overall feels okay, though he does note some dyspnea on exertion when walking up a hill or stairs as well as some fatigue.  He would like to get back into normal rhythm.  Past Medical History:  Diagnosis Date   Acute tracheobronchitis 01/05/2019   B12 deficiency    Basal cell carcinoma (BCC) of left side of nose 01/28/2018   Cancer ([place])    Cardiomyopathy ([place]) 03/10/2018   With chronic atrial fibrillation   CHF (congestive heart failure) ([place]) 04/16/2013   Functional class II, ejection fraction 35-40%  Formatting of this note might be different from the original. Functional class II, ejection fraction 35-40%   Chronic anticoagulation    Chronic diastolic (congestive) heart failure ([place]) 04/16/2013   Functional class II, ejection fraction 35-40%  Last Assessment & Plan:  Clinically stable Volume well controlled meds reviewed   Chronic  diastolic CHF (congestive heart failure) ([place]) 04/16/2013   Functional class II, ejection fraction 35-40%  Last Assessment & Plan:  Clinically stable Volume well controlled meds reviewed   Colon polyps 04/16/2013   Diabetic neuropathy (HCC)    Eosinophil count raised 02/17/2019   Essential hypertension 01/06/2010   Last Assessment & Plan:  Well controlled Continue med management   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  Steroid usage was discontinued several months ago.  Follow up with Dermatology as planned.   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   Last Assessment & Plan:  Id following ?ongoing abx managementy reported Request records   Infection of prosthetic right knee joint ([place])    Insomnia 03/04/2012   Grief with loss of wife 02/20/12  Last Assessment & Plan:  Improved sx  contineu xanax prn Se discussed   Lesion of liver 04/20/2019   -On cardiac CT 02/2019   Mild reactive airways disease    Neoplasm of prostate 04/16/2013   Neoplasm of prostate, malignant ([place]) 01/06/2010   Charles Lloyd S/p radiation therapy   Last Assessment & Plan:  Repeat psa today No urinary sx Pt reported fatigue/poor appetite   On amiodarone therapy 09/07/2013   On continuous oral anticoagulation 01/28/2018   Other activity(E029.9) 04/20/2013   Formatting  of this note might be different from the original. Transthoracic Echocardiogram-03/10/2013-[facility]: Normal left ventricular wall thickness and cavity size.  Global left ventricular systolic function is moderately reduced.  The estimated ejection fraction is 35-40%.  The left  atrium is moderately enlarged.  The right atrium is mildly enlarged.  No significant valvular    Overweight (BMI 25.0-29.9) 10/22/2016   Persistent atrial fibrillation ([place]) 04/16/2013   Last Assessment & Plan:  Rate controlled Continue med management eliquis for stroke preventino  Formatting of this note might be different from the original.  Drug  HX Current Rx Pre-ABL inefficacy Pre-ABL intolerant Post-ABL inefficacy Post-ABL intolerant max dose/24h M/Y end comments  sotalol                  dofetilide                  flecainide                  propafenone                  am   S/P TKR (total knee replacement), bilateral    Secondary hypercoagulable state ([place]) 04/09/2019   Tinea cruris 04/16/2013   Type 2 diabetes mellitus with diabetic polyneuropathy, without long-term current use of insulin ([place]) 04/16/2013   Last Assessment & Plan:  Labs today Pt reports well controlled on ambulatory monitoring   Vitamin D deficiency 07/25/2018   Past Surgical History:  Procedure Laterality Date   ATRIAL FIBRILLATION ABLATION N/A 03/12/2019   Procedure: ATRIAL FIBRILLATION ABLATION;  Surgeon: [name], MD;  Location: [facility];  Service: Cardiovascular;  Laterality: N/A;   ATRIAL FIBRILLATION ABLATION N/A 04/29/2020   Procedure: ATRIAL FIBRILLATION ABLATION;  Surgeon: [name], MD;  Location: [facility];  Service: Cardiovascular;  Laterality: N/A;   CARDIAC ELECTROPHYSIOLOGY STUDY AND ABLATION     CARDIOVERSION     CARDIOVERSION N/A 10/10/2018   Procedure: CARDIOVERSION;  Surgeon: [name], MD;  Location: [facility];  Service: Cardiovascular;  Laterality: N/A;   CARDIOVERSION N/A 05/18/2020   Procedure: CARDIOVERSION;  Surgeon: [name], MD;  Location: [facility];  Service: Cardiovascular;  Laterality: N/A;   CHOLECYSTECTOMY     PROSTATECTOMY     REPLACEMENT TOTAL KNEE BILATERAL       Current Outpatient Medications  Medication Sig Dispense Refill    albuterol (VENTOLIN HFA) 108 (90 Base) MCG/ACT inhaler Inhale 1-2 puffs into the lungs every 6 (six) hours as needed for wheezing or shortness of breath. 8 g 2   amoxicillin (AMOXIL) 500 MG capsule Take 2,000 mg by mouth See admin instructions. Take 4 capsules (2000 mg) by mouth 1 hour prior to dental procedures.     atorvastatin (LIPITOR) 20 MG tablet TAKE 1 TABLET DAILY AT 6 P.M. 90 tablet 3   cephALEXin (KEFLEX) 500 MG capsule Take 1 capsule (500 mg total) by mouth 3 (three) times daily. 21 capsule 0   ELIQUIS 5 MG TABS tablet TAKE 1 TABLET TWICE A DAY 180 tablet 3   fluticasone (FLONASE) 50 MCG/ACT nasal spray Place 2 sprays into both nostrils daily. 16 g 2   fluticasone furoate-vilanterol (BREO ELLIPTA) 100-25 MCG/INH AEPB Inhale 1 puff into the lungs daily. 60 each 11   gabapentin (NEURONTIN) 300 MG capsule TAKE 2 CAPSULES BY MOUTH AT BEDTIME 60 capsule 0   glucose blood (ACCU-CHEK AVIVA PLUS) test strip 1 each by Other  route daily. Dx E11.9 100 each 12   metFORMIN (GLUCOPHAGE) 500 MG tablet TAKE 1 TABLET TWICE A DAY WITH MEALS 180 tablet 3   montelukast (SINGULAIR) 10 MG tablet Take 10 mg by mouth at bedtime.      potassium chloride SA (KLOR-CON M20) 20 MEQ tablet Take 1 tablet (20 mEq total) by mouth daily. 90 tablet 3   RESTASIS 0.05 % ophthalmic emulsion Place 1 drop into both eyes 2 (two) times daily.     torsemide (DEMADEX) 20 MG tablet Take 1 tablet (20 mg total) by mouth daily as needed (fluid retention). 90 tablet 3   valsartan (DIOVAN) 160 MG tablet Take 1 tablet (160 mg total) by mouth daily. 90 tablet 3   amLODipine (NORVASC) 5 MG tablet Take 1 tablet (5 mg total) by mouth daily. 90 tablet 3   carvedilol (COREG) 12.5 MG tablet Take 1 tablet (12.5 mg total) by mouth 2 (two) times daily. 180 tablet 3   No current facility-administered medications for this visit.    Allergies:   Patient has no known allergies.   Social History:  The patient  reports that he has never smoked.  He has never used smokeless tobacco. He reports current alcohol use of about 4.0 standard drinks per week. He reports that he does not use drugs.   Family History:  The patient's family history includes Cancer in his father and mother; Depression in his father and mother; Early death in his father and mother.   ROS:  Please see the history of present illness.   Otherwise, review of systems is positive for none.   All other systems are reviewed and negative.   PHYSICAL EXAM: VS:  BP 118/64   Pulse (!) 113   Resp 18   Ht 5\' 10"  (1.778 m)   Wt 184 lb (83.5 kg)   SpO2 98%   BMI 26.40 kg/m  , BMI Body mass index is 26.4 kg/m. GEN: Well nourished, well developed, in no acute distress  HEENT: normal  Neck: no JVD, carotid bruits, or masses Cardiac: Irregular, tachycardic; no murmurs, rubs, or gallops,no edema  Respiratory:  clear to auscultation bilaterally, normal work of breathing GI: soft, nontender, nondistended, + BS MS: no deformity or atrophy  Skin: warm and dry Neuro:  Strength and sensation are intact Psych: euthymic mood, full affect  EKG:  EKG is ordered today. Personal review of the ekg ordered shows atrial fibrillation, PVCs, rate 113   Recent Labs: 01/29/2020: Magnesium 1.5; TSH 0.912 06/10/2020: BUN 14; Creatinine, Ser 0.99; Hemoglobin 10.7; NT-Pro BNP 2,486; Platelets 183; Potassium 4.6; Sodium 138    Lipid Panel     Component Value Date/Time   CHOL 120 05/20/2020 0914   TRIG 73 05/20/2020 0914   HDL 46 05/20/2020 0914   CHOLHDL 2.6 05/20/2020 0914   CHOLHDL 3 07/24/2018 1058   VLDL 25.0 07/24/2018 1058   LDLCALC 59 05/20/2020 0914     Wt Readings from Last 3 Encounters:  11/21/20 184 lb (83.5 kg)  10/05/20 183 lb 6.4 oz (83.2 kg)  10/03/20 183 lb 14.4 oz (83.4 kg)      Other studies Reviewed: Additional studies/ records that were reviewed today include: TTE 10/07/18  Review of the above records today demonstrates:   1. The left ventricle has low  normal systolic function, with an ejection fraction of 50-55%. The cavity size was normal. Left ventricular diastolic function could not be evaluated secondary to atrial fibrillation. No evidence of left ventricular  regional wall motion abnormalities.  2. The right ventricle has normal systolic function. The cavity was normal. There is no increase in right ventricular wall thickness. Right ventricular systolic pressure is normal.  3. There is mild mitral annular calcification present.  4. The aortic valve is tricuspid. Moderate sclerosis of the aortic valve. Aortic valve regurgitation was not assessed by color flow Doppler.  5. The aorta is normal in size and structure.  6. There is right bowing of the interatrial septum, suggestive of elevated left atrial pressure.  Myoview 05/25/19 Nuclear stress EF: 60%. No T wave inversion was noted during stress. There was no ST segment deviation noted during stress. This is a low risk study.   Normal perfusion. LVEF 60% with normal wall motion. This is a low risk study. No prior for comparison.  ASSESSMENT AND PLAN:  1.  Persistent atrial fibrillation: Currently on Eliquis.  Status post ablation 03/02/2019 with repeat ablation 04/29/2020.  CHA2DS2-VASc of 4.  Did require cardioversion 04/28/2020.  He is unfortunately back in atrial fibrillation.  Due to that, we Elim Economou start him on flecainide 100 mg twice daily.  We Cira Deyoe have him come back next week for an ECG.  If he is not back in normal rhythm, we Drevin Ortner plan for cardioversion.  2.  Chronic systolic heart failure: Ejection fraction is improved.  No obvious volume overload.  3.  Hypertension: Currently well controlled  4.  Hyperlipidemia: Continue statin per primary cardiology.   Current medicines are reviewed at length with the patient today.   The patient does not have concerns regarding his medicines.  The following changes were made today: Start flecainide  Labs/ tests ordered today include:  Orders  Placed This Encounter  Procedures   EKG 12-Lead      Disposition:   FU with Kenlee Maler 3 months  Signed, Micholas Drumwright Meredith Leeds, MD  11/21/2020 3:59 PM     [place] [address] [phone] (office) [phone] (fax)

## 2020-11-21 NOTE — Addendum Note (Signed)
Addended by: Stanton Kidney on: 11/21/2020 04:21 PM   Modules accepted: Orders

## 2020-11-21 NOTE — Progress Notes (Signed)
Electrophysiology Office Note   Date:  11/21/2020   ID:  Charles Dauria., DOB Oct 03, 1940, MRN [id number]  PCP:  Isaac Bliss, [name], MD  Cardiologist:  Bettina Gavia Primary Electrophysiologist:  Sayge Salvato Meredith Leeds, MD    Chief Complaint: atrial fibrillation    History of Present Illness: Charles Mayabb. is a 80 y.o. male who is being seen today for the evaluation of atrial fibrillation at the request of [name] Presenting today for electrophysiology evaluation.  He has a history of atrial fibrillation, moderate systolic heart failure, diabetes, hyperlipidemia.  He had ablation at [facility] in 2015 and cardioversion 2017.  He had repeat ablation 03/12/2019.  Unfortunately went back into atrial fibrillation status post ablation 04/29/20  Today, denies symptoms of palpitations, chest pain, shortness of breath, orthopnea, PND, lower extremity edema, claudication, dizziness, presyncope, syncope, bleeding, or neurologic sequela. The patient is tolerating medications without difficulties.  Unfortunately has gone back into atrial fibrillation.  He overall feels okay, though he does note some dyspnea on exertion when walking up a hill or stairs as well as some fatigue.  He would like to get back into normal rhythm.  Past Medical History:  Diagnosis Date   Acute tracheobronchitis 01/05/2019   B12 deficiency    Basal cell carcinoma (BCC) of left side of nose 01/28/2018   Cancer ([place])    Cardiomyopathy ([place]) 03/10/2018   With chronic atrial fibrillation   CHF (congestive heart failure) ([place]) 04/16/2013   Functional class II, ejection fraction 35-40%  Formatting of this note might be different from the original. Functional class II, ejection fraction 35-40%   Chronic anticoagulation    Chronic diastolic (congestive) heart failure ([place]) 04/16/2013   Functional class II, ejection fraction 35-40%  Last Assessment & Plan:  Clinically stable Volume well controlled meds reviewed   Chronic  diastolic CHF (congestive heart failure) ([place]) 04/16/2013   Functional class II, ejection fraction 35-40%  Last Assessment & Plan:  Clinically stable Volume well controlled meds reviewed   Colon polyps 04/16/2013   Diabetic neuropathy (HCC)    Eosinophil count raised 02/17/2019   Essential hypertension 01/06/2010   Last Assessment & Plan:  Well controlled Continue med management   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  Steroid usage was discontinued several months ago.  Follow up with Dermatology as planned.   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   Last Assessment & Plan:  Id following ?ongoing abx managementy reported Request records   Infection of prosthetic right knee joint ([place])    Insomnia 03/04/2012   Grief with loss of wife 02/20/12  Last Assessment & Plan:  Improved sx  contineu xanax prn Se discussed   Lesion of liver 04/20/2019   -On cardiac CT 02/2019   Mild reactive airways disease    Neoplasm of prostate 04/16/2013   Neoplasm of prostate, malignant ([place]) 01/06/2010   [name] S/p radiation therapy   Last Assessment & Plan:  Repeat psa today No urinary sx Pt reported fatigue/poor appetite   On amiodarone therapy 09/07/2013   On continuous oral anticoagulation 01/28/2018   Other activity(E029.9) 04/20/2013   Formatting  of this note might be different from the original. Transthoracic Echocardiogram-03/10/[facility]: Normal left ventricular wall thickness and cavity size.  Global left ventricular systolic function is moderately reduced.  The estimated ejection fraction is 35-40%.  The left  atrium is moderately enlarged.  The right atrium is mildly enlarged.  No significant valvular    Overweight (BMI 25.0-29.9) 10/22/2016   Persistent atrial fibrillation ([place]) 04/16/2013   Last Assessment & Plan:  Rate controlled Continue med management eliquis for stroke preventino  Formatting of this note might be different from the original.  Drug  HX Current Rx Pre-ABL inefficacy Pre-ABL intolerant Post-ABL inefficacy Post-ABL intolerant max dose/24h M/Y end comments  sotalol                  dofetilide                  flecainide                  propafenone                  am   S/P TKR (total knee replacement), bilateral    Secondary hypercoagulable state ([place]) 04/09/2019   Tinea cruris 04/16/2013   Type 2 diabetes mellitus with diabetic polyneuropathy, without long-term current use of insulin ([place]) 04/16/2013   Last Assessment & Plan:  Labs today Pt reports well controlled on ambulatory monitoring   Vitamin D deficiency 07/25/2018   Past Surgical History:  Procedure Laterality Date   ATRIAL FIBRILLATION ABLATION N/A 03/12/2019   Procedure: ATRIAL FIBRILLATION ABLATION;  Surgeon: [name], MD;  Location: [place] [facility];  Service: Cardiovascular;  Laterality: N/A;   ATRIAL FIBRILLATION ABLATION N/A 04/29/2020   Procedure: ATRIAL FIBRILLATION ABLATION;  Surgeon: [name], MD;  Location: [place] [facility];  Service: Cardiovascular;  Laterality: N/A;   CARDIAC ELECTROPHYSIOLOGY STUDY AND ABLATION     CARDIOVERSION     CARDIOVERSION N/A 10/10/2018   Procedure: CARDIOVERSION;  Surgeon: [name], MD;  Location: [facility];  Service: Cardiovascular;  Laterality: N/A;   CARDIOVERSION N/A 05/18/2020   Procedure: CARDIOVERSION;  Surgeon: [name], MD;  Location: [facility];  Service: Cardiovascular;  Laterality: N/A;   CHOLECYSTECTOMY     PROSTATECTOMY     REPLACEMENT TOTAL KNEE BILATERAL       Current Outpatient Medications  Medication Sig Dispense Refill    albuterol (VENTOLIN HFA) 108 (90 Base) MCG/ACT inhaler Inhale 1-2 puffs into the lungs every 6 (six) hours as needed for wheezing or shortness of breath. 8 g 2   amoxicillin (AMOXIL) 500 MG capsule Take 2,000 mg by mouth See admin instructions. Take 4 capsules (2000 mg) by mouth 1 hour prior to dental procedures.     atorvastatin (LIPITOR) 20 MG tablet TAKE 1 TABLET DAILY AT 6 P.M. 90 tablet 3   cephALEXin (KEFLEX) 500 MG capsule Take 1 capsule (500 mg total) by mouth 3 (three) times daily. 21 capsule 0   ELIQUIS 5 MG TABS tablet TAKE 1 TABLET TWICE A DAY 180 tablet 3   fluticasone (FLONASE) 50 MCG/ACT nasal spray Place 2 sprays into both nostrils daily. 16 g 2   fluticasone furoate-vilanterol (BREO ELLIPTA) 100-25 MCG/INH AEPB Inhale 1 puff into the lungs daily. 60 each 11   gabapentin (NEURONTIN) 300 MG capsule TAKE 2 CAPSULES BY MOUTH AT BEDTIME 60 capsule 0   glucose blood (ACCU-CHEK AVIVA PLUS) test strip 1 each by Other  route daily. Dx E11.9 100 each 12   metFORMIN (GLUCOPHAGE) 500 MG tablet TAKE 1 TABLET TWICE A DAY WITH MEALS 180 tablet 3   montelukast (SINGULAIR) 10 MG tablet Take 10 mg by mouth at bedtime.      potassium chloride SA (KLOR-CON M20) 20 MEQ tablet Take 1 tablet (20 mEq total) by mouth daily. 90 tablet 3   RESTASIS 0.05 % ophthalmic emulsion Place 1 drop into both eyes 2 (two) times daily.     torsemide (DEMADEX) 20 MG tablet Take 1 tablet (20 mg total) by mouth daily as needed (fluid retention). 90 tablet 3   valsartan (DIOVAN) 160 MG tablet Take 1 tablet (160 mg total) by mouth daily. 90 tablet 3   amLODipine (NORVASC) 5 MG tablet Take 1 tablet (5 mg total) by mouth daily. 90 tablet 3   carvedilol (COREG) 12.5 MG tablet Take 1 tablet (12.5 mg total) by mouth 2 (two) times daily. 180 tablet 3   No current facility-administered medications for this visit.    Allergies:   Patient has no known allergies.   Social History:  The patient  reports that he has never smoked.  He has never used smokeless tobacco. He reports current alcohol use of about 4.0 standard drinks per week. He reports that he does not use drugs.   Family History:  The patient's family history includes Cancer in his father and mother; Depression in his father and mother; Early death in his father and mother.   ROS:  Please see the history of present illness.   Otherwise, review of systems is positive for none.   All other systems are reviewed and negative.   PHYSICAL EXAM: VS:  BP 118/64   Pulse (!) 113   Resp 18   Ht 5\' 10"  (1.778 m)   Wt 184 lb (83.5 kg)   SpO2 98%   BMI 26.40 kg/m  , BMI Body mass index is 26.4 kg/m. GEN: Well nourished, well developed, in no acute distress  HEENT: normal  Neck: no JVD, carotid bruits, or masses Cardiac: Irregular, tachycardic; no murmurs, rubs, or gallops,no edema  Respiratory:  clear to auscultation bilaterally, normal work of breathing GI: soft, nontender, nondistended, + BS MS: no deformity or atrophy  Skin: warm and dry Neuro:  Strength and sensation are intact Psych: euthymic mood, full affect  EKG:  EKG is ordered today. Personal review of the ekg ordered shows atrial fibrillation, PVCs, rate 113   Recent Labs: 01/29/2020: Magnesium 1.5; TSH 0.912 06/10/2020: BUN 14; Creatinine, Ser 0.99; Hemoglobin 10.7; NT-Pro BNP 2,486; Platelets 183; Potassium 4.6; Sodium 138    Lipid Panel     Component Value Date/Time   CHOL 120 05/20/2020 0914   TRIG 73 05/20/2020 0914   HDL 46 05/20/2020 0914   CHOLHDL 2.6 05/20/2020 0914   CHOLHDL 3 07/24/2018 1058   VLDL 25.0 07/24/2018 1058   LDLCALC 59 05/20/2020 0914     Wt Readings from Last 3 Encounters:  11/21/20 184 lb (83.5 kg)  10/05/20 183 lb 6.4 oz (83.2 kg)  10/03/20 183 lb 14.4 oz (83.4 kg)      Other studies Reviewed: Additional studies/ records that were reviewed today include: TTE 10/07/18  Review of the above records today demonstrates:   1. The left ventricle has low  normal systolic function, with an ejection fraction of 50-55%. The cavity size was normal. Left ventricular diastolic function could not be evaluated secondary to atrial fibrillation. No evidence of left ventricular  regional wall motion abnormalities.  2. The right ventricle has normal systolic function. The cavity was normal. There is no increase in right ventricular wall thickness. Right ventricular systolic pressure is normal.  3. There is mild mitral annular calcification present.  4. The aortic valve is tricuspid. Moderate sclerosis of the aortic valve. Aortic valve regurgitation was not assessed by color flow Doppler.  5. The aorta is normal in size and structure.  6. There is right bowing of the interatrial septum, suggestive of elevated left atrial pressure.  Myoview 05/25/19 Nuclear stress EF: 60%. No T wave inversion was noted during stress. There was no ST segment deviation noted during stress. This is a low risk study.   Normal perfusion. LVEF 60% with normal wall motion. This is a low risk study. No prior for comparison.  ASSESSMENT AND PLAN:  1.  Persistent atrial fibrillation: Currently on Eliquis.  Status post ablation 03/02/2019 with repeat ablation 04/29/2020.  CHA2DS2-VASc of 4.  Did require cardioversion 04/28/2020.  He is unfortunately back in atrial fibrillation.  Due to that, we Khi Mcmillen start him on flecainide 100 mg twice daily.  We Aristeo Hankerson have him come back next week for an ECG.  If he is not back in normal rhythm, we Janaia Kozel plan for cardioversion.  2.  Chronic systolic heart failure: Ejection fraction is improved.  No obvious volume overload.  3.  Hypertension: Currently well controlled  4.  Hyperlipidemia: Continue statin per primary cardiology.   Current medicines are reviewed at length with the patient today.   The patient does not have concerns regarding his medicines.  The following changes were made today: Start flecainide  Labs/ tests ordered today include:  Orders  Placed This Encounter  Procedures   EKG 12-Lead      Disposition:   FU with Brytnee Bechler 3 months  Signed, Kate Larock Meredith Leeds, MD  11/21/2020 3:59 PM     [address] [place] [address] [phone] (office) [phone] (fax)

## 2020-11-21 NOTE — Patient Instructions (Signed)
Medication Instructions:  Your physician has recommended you make the following change in your medication:  START Flecainide 100 mg  twice a day  *If you need a refill on your cardiac medications before your next appointment, please call your pharmacy*   Lab Work: None ordered If you have labs (blood work) drawn today and your tests are completely normal, you will receive your results only by: [place] (if you have MyChart) OR A paper copy in the mail If you have any lab test that is abnormal or we need to change your treatment, we will call you to review the results.   Testing/Procedures: None ordered   Follow-Up: At [facility], you and your health needs are our priority.  As part of our continuing mission to provide you with exceptional heart care, we have created designated Provider Care Teams.  These Care Teams include your primary Cardiologist (physician) and Advanced Practice Providers (APPs -  Physician Assistants and Nurse Practitioners) who all work together to provide you with the care you need, when you need it.  Your next appointment:   6 month(s)  The format for your next appointment:   In Person  Provider:   Allegra Lai, MD  You are scheduled for a follow up EKG on Monday 10/03 @ 1:45 pm in the [facility]  Thank you for choosing [facility]!!   Trinidad Curet, RN [phone]   Other Instructions    Flecainide Tablets What is this medication? FLECAINIDE (FLEK a nide) prevents and treats a fast or irregular heartbeat (arrhythmia). It is often used to treat a type of arrhythmia known as AFib (atrial fibrillation). It works by slowing down overactive electric signals in the heart, which stabilizes your heart rhythm. It belongs to a group of medications called antiarrhythmics. This medicine may be used for other purposes; ask your health care provider or pharmacist if you have questions. COMMON BRAND NAME(S): Tambocor What should I  tell my care team before I take this medication? They need to know if you have any of these conditions: Abnormal levels of potassium in the blood Heart disease including heart rhythm and heart rate problems Kidney or liver disease Recent heart attack An unusual or allergic reaction to flecainide, local anesthetics, other medications, foods, dyes, or preservatives Pregnant or trying to get pregnant Breast-feeding How should I use this medication? Take this medication by mouth with a glass of water. Follow the directions on the prescription label. You can take this medication with or without food. Take your doses at regular intervals. Do not take your medication more often than directed. Do not stop taking this medication suddenly. This may cause serious, heart-related side effects. If your care team wants you to stop the medication, the dose may be slowly lowered over time to avoid any side effects. Talk to your care team regarding the use of this medication in children. While this medication may be prescribed for children as young as 1 year of age for selected conditions, precautions do apply. Overdosage: If you think you have taken too much of this medicine contact a poison control center or emergency room at once. NOTE: This medicine is only for you. Do not share this medicine with others. What if I miss a dose? If you miss a dose, take it as soon as you can. If it is almost time for your next dose, take only that dose. Do not take double or extra doses. What may interact with this medication?  Do not take this medication with any of the following: Amoxapine Arsenic trioxide Certain antibiotics like clarithromycin, erythromycin, gatifloxacin, gemifloxacin, levofloxacin, moxifloxacin, sparfloxacin, or troleandomycin Certain antidepressants called tricyclic antidepressants like amitriptyline, imipramine, or nortriptyline Certain medications to control heart rhythm like disopyramide, encainide,  moricizine, procainamide, propafenone, and quinidine Cisapride Delavirdine Droperidol Haloperidol [place] Imatinib Levomethadyl Maprotiline Medications for malaria like chloroquine and halofantrine Pentamidine Phenothiazines like chlorpromazine, mesoridazine, prochlorperazine, thioridazine Pimozide Quinine Ranolazine Ritonavir Sertindole This medication may also interact with the following: Cimetidine Dofetilide Medications for angina or high blood pressure Medications to control heart rhythm like amiodarone and digoxin Ziprasidone This list may not describe all possible interactions. Give your health care provider a list of all the medicines, herbs, non-prescription drugs, or dietary supplements you use. Also tell them if you smoke, drink alcohol, or use illegal drugs. Some items may interact with your medicine. What should I watch for while using this medication? Visit your care team for regular checks on your progress. Because your condition and the use of this medication carries some risk, it is a good idea to carry an identification card, necklace or bracelet with details of your condition, medications, and care team. Check your blood pressure and pulse rate regularly. Ask your care team what your blood pressure and pulse rate should be, and when you should contact them. Your care team also may schedule regular blood tests and electrocardiograms to check your progress. You may get drowsy or dizzy. Do not drive, use machinery, or do anything that needs mental alertness until you know how this medication affects you. Do not stand or sit up quickly, especially if you are an older patient. This reduces the risk of dizzy or fainting spells. Alcohol can make you more dizzy, increase flushing and rapid heartbeats. Avoid alcoholic drinks. What side effects may I notice from receiving this medication? Side effects that you should report to your care team as soon as possible: Allergic  reactions-skin rash, itching, hives, swelling of the face, lips, tongue, or throat Heart failure-shortness of breath, swelling of the ankles, feet, or hands, sudden weight gain, unusual weakness or fatigue Heart rhythm changes-fast or irregular heartbeat, dizziness, feeling faint or lightheaded, chest pain, trouble breathing Liver injury-right upper belly pain, loss of appetite, nausea, light-colored stool, dark yellow or brown urine, yellowing skin or eyes, unusual weakness or fatigue Side effects that usually do not require medical attention (report to your care team if they continue or are bothersome): Blurry vision Constipation Dizziness Fatigue Headache Nausea Tremors or shaking This list may not describe all possible side effects. Call your doctor for medical advice about side effects. You may report side effects to FDA at 1-800-FDA-1088. Where should I keep my medication? Keep out of the reach of children and pets. Store at room temperature between 15 and 30 degrees C (59 and 86 degrees F). Protect from light. Keep container tightly closed. Throw away any unused medication after the expiration date. NOTE: This sheet is a summary. It may not cover all possible information. If you have questions about this medicine, talk to your doctor, pharmacist, or health care provider.  2022 Elsevier/Gold Standard (2020-03-17 12:17:39)

## 2020-11-23 ENCOUNTER — Ambulatory Visit (INDEPENDENT_AMBULATORY_CARE_PROVIDER_SITE_OTHER): Payer: Medicare Other | Admitting: Internal Medicine

## 2020-11-23 ENCOUNTER — Other Ambulatory Visit: Payer: Self-pay

## 2020-11-23 ENCOUNTER — Encounter: Payer: Self-pay | Admitting: Internal Medicine

## 2020-11-23 VITALS — BP 110/80 | HR 87 | Temp 98.2°F | Wt 187.6 lb

## 2020-11-23 DIAGNOSIS — K6389 Other specified diseases of intestine: Secondary | ICD-10-CM

## 2020-11-23 DIAGNOSIS — Z23 Encounter for immunization: Secondary | ICD-10-CM

## 2020-11-23 MED ORDER — SACCHAROMYCES BOULARDII 250 MG PO CAPS
250.0000 mg | ORAL_CAPSULE | Freq: Two times a day (BID) | ORAL | 0 refills | Status: DC
Start: 2020-11-23 — End: 2021-08-08

## 2020-11-23 NOTE — Patient Instructions (Signed)
-  Nice seeing you today!!  -Start Florastor 250 mg twice daily.

## 2020-11-23 NOTE — Addendum Note (Signed)
Addended by: Westley Hummer B on: 11/23/2020 03:21 PM   Modules accepted: Orders

## 2020-11-23 NOTE — Progress Notes (Signed)
Established Patient Office Visit     This visit occurred during the SARS-CoV-2 public health emergency.  Safety protocols were in place, including screening questions prior to the visit, additional usage of staff PPE, and extensive cleaning of exam room while observing appropriate contact time as indicated for disinfecting solutions.    CC/Reason for Visit: Diarrhea  HPI: Charles Lloyd. is a 80 y.o. male who is coming in today for the above mentioned reasons.  He has been having diarrhea now for a couple of weeks.  He recently had 2 separate courses of antibiotics for a presumed toe cellulitis.  First he received Keflex followed by doxycycline.  Earlier in the year he also had a longer course of doxycycline for presumed Lyme's disease.  He states that in the morning he will have a formed bowel movement but then throughout the day will have anywhere from 5-7 watery bowel movements a day.  He also has some mild abdominal bloating.  Past Medical/Surgical History: Past Medical History:  Diagnosis Date   Acute tracheobronchitis 01/05/2019   B12 deficiency    Basal cell carcinoma (BCC) of left side of nose 01/28/2018   Cancer ([place])    Cardiomyopathy ([place]) 03/10/2018   With chronic atrial fibrillation   CHF (congestive heart failure) ([place]) 04/16/2013   Functional class II, ejection fraction 35-40%  Formatting of this note might be different from the original. Functional class II, ejection fraction 35-40%   Chronic anticoagulation    Chronic diastolic (congestive) heart failure ([place]) 04/16/2013   Functional class II, ejection fraction 35-40%  Last Assessment & Plan:  Clinically stable Volume well controlled meds reviewed   Chronic diastolic CHF (congestive heart failure) ([place]) 04/16/2013   Functional class II, ejection fraction 35-40%  Last Assessment & Plan:  Clinically stable Volume well controlled meds reviewed   Colon polyps 04/16/2013   Diabetic neuropathy (HCC)    Eosinophil count  raised 02/17/2019   Essential hypertension 01/06/2010   Last Assessment & Plan:  Well controlled Continue med management   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  Steroid usage was discontinued several months ago.  Follow up with Dermatology as planned.   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   Last Assessment & Plan:  Id following ?ongoing abx managementy reported Request records   Infection of prosthetic right knee joint ([place])    Insomnia 03/04/2012   Grief with loss of wife 02/20/12  Last Assessment & Plan:  Improved sx  contineu xanax prn Se discussed   Lesion of liver 04/20/2019   -On cardiac CT 02/2019   Mild reactive airways disease    Neoplasm of prostate 04/16/2013   Neoplasm of prostate, malignant ([place]) 01/06/2010   [name] S/p radiation therapy   Last Assessment & Plan:  Repeat psa today No urinary sx Pt reported fatigue/poor appetite   On amiodarone therapy 09/07/2013   On continuous oral anticoagulation 01/28/2018   Other activity(E029.9) 04/20/2013   Formatting of this note might be different from the original. Transthoracic Echocardiogram-03/10/2013-[facility]: Normal left ventricular wall thickness and cavity size.  Global left ventricular systolic function  is moderately reduced.  The estimated ejection fraction is 35-40%.  The left atrium is moderately enlarged.  The right atrium is mildly enlarged.  No significant valvular    Overweight (BMI 25.0-29.9) 10/22/2016   Persistent atrial fibrillation ([place]) 04/16/2013   Last Assessment & Plan:  Rate controlled Continue med management eliquis  for stroke preventino  Formatting of this note might be different from the original.  Drug  HX Current Rx Pre-ABL inefficacy Pre-ABL intolerant Post-ABL inefficacy Post-ABL intolerant max dose/24h M/Y end comments  sotalol                  dofetilide                  flecainide                  propafenone                  am   S/P TKR (total knee replacement), bilateral    Secondary hypercoagulable state ([place]) 04/09/2019   Tinea cruris 04/16/2013   Type 2 diabetes mellitus with diabetic polyneuropathy, without long-term current use of insulin ([place]) 04/16/2013   Last Assessment & Plan:  Labs today Pt reports well controlled on ambulatory monitoring   Vitamin D deficiency 07/25/2018    Past Surgical History:  Procedure Laterality Date   ATRIAL FIBRILLATION ABLATION N/A 03/12/2019   Procedure: ATRIAL FIBRILLATION ABLATION;  Surgeon: [name], MD;  Location: [facility];  Service: Cardiovascular;  Laterality: N/A;   ATRIAL FIBRILLATION ABLATION N/A 04/29/2020   Procedure: ATRIAL FIBRILLATION ABLATION;  Surgeon: [name], MD;  Location: [facility];  Service: Cardiovascular;  Laterality: N/A;   CARDIAC ELECTROPHYSIOLOGY STUDY AND ABLATION     CARDIOVERSION     CARDIOVERSION N/A 10/10/2018   Procedure: CARDIOVERSION;  Surgeon: [name], MD;  Location: [facility] ENDOSCOPY;  Service: Cardiovascular;  Laterality: N/A;   CARDIOVERSION N/A 05/18/2020   Procedure: CARDIOVERSION;  Surgeon: [name], MD;  Location: [facility] ENDOSCOPY;  Service: Cardiovascular;  Laterality: N/A;   CHOLECYSTECTOMY     PROSTATECTOMY     REPLACEMENT TOTAL KNEE BILATERAL      Social History:  reports that he has never smoked. He has never used smokeless tobacco. He reports current alcohol use of about 4.0 standard drinks per week. He reports that he does not use drugs.  Allergies: No Known Allergies  Family History:  Family History  Problem Relation Age of Onset   Cancer Mother     Depression Mother    Early death Mother    Cancer Father    Depression Father    Early death Father      Current Outpatient Medications:    albuterol (VENTOLIN HFA) 108 (90 Base) MCG/ACT inhaler, Inhale 1-2 puffs into the lungs every 6 (six) hours as needed for wheezing or shortness of breath., Disp: 8 g, Rfl: 2   amoxicillin (AMOXIL) 500 MG capsule, Take 2,000 mg by mouth See admin instructions. Take 4 capsules (2000 mg) by mouth 1 hour prior to dental procedures., Disp: , Rfl:    atorvastatin (LIPITOR) 20 MG tablet, TAKE 1 TABLET DAILY AT 6 P.M., Disp: 90 tablet, Rfl: 3   ELIQUIS 5 MG TABS tablet, TAKE 1 TABLET TWICE A DAY, Disp: 180 tablet, Rfl: 3   flecainide (TAMBOCOR) 100 MG tablet, Take 1 tablet (100 mg total) by mouth 2 (two) times daily., Disp: 60 tablet, Rfl: 3  fluticasone (FLONASE) 50 MCG/ACT nasal spray, Place 2 sprays into both nostrils daily., Disp: 16 g, Rfl: 2   fluticasone furoate-vilanterol (BREO ELLIPTA) 100-25 MCG/INH AEPB, Inhale 1 puff into the lungs daily., Disp: 60 each, Rfl: 11   gabapentin (NEURONTIN) 300 MG capsule, TAKE 2 CAPSULES BY MOUTH AT BEDTIME, Disp: 60 capsule, Rfl: 0   glucose blood (ACCU-CHEK AVIVA PLUS) test strip, 1 each by Other route daily. Dx E11.9, Disp: 100 each, Rfl: 12   metFORMIN (GLUCOPHAGE) 500 MG tablet, TAKE 1 TABLET TWICE A DAY WITH MEALS, Disp: 180 tablet, Rfl: 3   montelukast (SINGULAIR) 10 MG tablet, Take 10 mg by mouth at bedtime. , Disp: , Rfl:    potassium chloride SA (KLOR-CON M20) 20 MEQ tablet, Take 1 tablet (20 mEq total) by mouth daily., Disp: 90 tablet, Rfl: 3   RESTASIS 0.05 % ophthalmic emulsion, Place 1 drop into both eyes 2 (two) times daily., Disp: , Rfl:    saccharomyces boulardii (FLORASTOR) 250 MG capsule, Take 1 capsule (250 mg total) by mouth 2 (two) times daily., Disp: 180 capsule, Rfl: 0   torsemide (DEMADEX) 20 MG tablet, Take 1 tablet (20 mg total) by mouth daily as needed (fluid retention)., Disp: 90 tablet, Rfl:  3   valsartan (DIOVAN) 160 MG tablet, Take 1 tablet (160 mg total) by mouth daily., Disp: 90 tablet, Rfl: 3   amLODipine (NORVASC) 5 MG tablet, Take 1 tablet (5 mg total) by mouth daily., Disp: 90 tablet, Rfl: 3   carvedilol (COREG) 12.5 MG tablet, Take 1 tablet (12.5 mg total) by mouth 2 (two) times daily., Disp: 180 tablet, Rfl: 3  Review of Systems:  Constitutional: Denies fever, chills, diaphoresis, appetite change and fatigue.  HEENT: Denies photophobia, eye pain, redness, hearing loss, ear pain, congestion, sore throat, rhinorrhea, sneezing, mouth sores, trouble swallowing, neck pain, neck stiffness and tinnitus.   Respiratory: Denies SOB, DOE, cough, chest tightness,  and wheezing.   Cardiovascular: Denies chest pain, palpitations and leg swelling.  Gastrointestinal: Denies nausea, vomiting, abdominal pain, constipation, blood in stool.  Genitourinary: Denies dysuria, urgency, frequency, hematuria, flank pain and difficulty urinating.  Endocrine: Denies: hot or cold intolerance, sweats, changes in hair or nails, polyuria, polydipsia. Musculoskeletal: Denies myalgias, back pain, joint swelling, arthralgias and gait problem.  Skin: Denies pallor, rash and wound.  Neurological: Denies dizziness, seizures, syncope, weakness, light-headedness, numbness and headaches.  Hematological: Denies adenopathy. Easy bruising, personal or family bleeding history  Psychiatric/Behavioral: Denies suicidal ideation, mood changes, confusion, nervousness, sleep disturbance and agitation    Physical Exam: Vitals:   11/23/20 1424  BP: 110/80  Pulse: 87  Temp: 98.2 F (36.8 C)  TempSrc: Oral  SpO2: 97%  Weight: 187 lb 9.6 oz (85.1 kg)    Body mass index is 26.92 kg/m.   Constitutional: NAD, calm, comfortable Eyes: PERRL, lids and conjunctivae normal ENMT: Mucous membranes are moist.  Neurologic: Grossly intact and nonfocal Psychiatric: Normal judgment and insight. Alert and oriented x 3.  Normal mood.    Impression and Plan:  Small intestinal bacterial overgrowth (SIBO)  - Plan: saccharomyces boulardii (FLORASTOR) 250 MG capsule -Suspect bacterial overgrowth syndrome due to symptoms of diarrhea, abdominal bloating and multiple recent rounds of antibiotic therapy. -Florastor twice daily.  Need for influenza vaccination -Flu vaccine administered today.  Time spent: 30 minutes reviewing chart, interviewing and examining patient and formulating plan of care.   Patient Instructions  -Nice seeing you today!!  -Start Florastor 250 mg twice daily.  Mirka Barbone Hernandez Acosta, MD [facility]   

## 2020-11-28 ENCOUNTER — Other Ambulatory Visit: Payer: Self-pay | Admitting: *Deleted

## 2020-11-28 ENCOUNTER — Other Ambulatory Visit: Payer: Self-pay

## 2020-11-28 ENCOUNTER — Other Ambulatory Visit: Payer: Self-pay | Admitting: Emergency Medicine

## 2020-11-28 ENCOUNTER — Ambulatory Visit (INDEPENDENT_AMBULATORY_CARE_PROVIDER_SITE_OTHER): Payer: Medicare Other | Admitting: *Deleted

## 2020-11-28 ENCOUNTER — Encounter: Payer: Self-pay | Admitting: *Deleted

## 2020-11-28 VITALS — BP 120/80 | HR 111 | Ht 70.0 in | Wt 185.0 lb

## 2020-11-28 DIAGNOSIS — I4819 Other persistent atrial fibrillation: Secondary | ICD-10-CM | POA: Diagnosis not present

## 2020-11-28 DIAGNOSIS — I4891 Unspecified atrial fibrillation: Secondary | ICD-10-CM

## 2020-11-28 NOTE — Progress Notes (Signed)
Pt here for EKG for Flecainide start .Discussed with Dr Curt Bears and pt remains in afib and needs DCCV. DCCV scheduled for 12-09-20 with Dr Stanford Breed .Adonis Housekeeper

## 2020-11-29 LAB — CBC
Hematocrit: 30.5 % — ABNORMAL LOW (ref 37.5–51.0)
Hemoglobin: 10.2 g/dL — ABNORMAL LOW (ref 13.0–17.7)
MCH: 30.9 pg (ref 26.6–33.0)
MCHC: 33.4 g/dL (ref 31.5–35.7)
MCV: 92 fL (ref 79–97)
Platelets: 272 10*3/uL (ref 150–450)
RBC: 3.3 x10E6/uL — ABNORMAL LOW (ref 4.14–5.80)
RDW: 13.2 % (ref 11.6–15.4)
WBC: 6.1 10*3/uL (ref 3.4–10.8)

## 2020-11-29 LAB — BASIC METABOLIC PANEL
BUN/Creatinine Ratio: 17 (ref 10–24)
BUN: 26 mg/dL (ref 8–27)
CO2: 25 mmol/L (ref 20–29)
Calcium: 9.4 mg/dL (ref 8.6–10.2)
Chloride: 99 mmol/L (ref 96–106)
Creatinine, Ser: 1.5 mg/dL — ABNORMAL HIGH (ref 0.76–1.27)
Glucose: 143 mg/dL — ABNORMAL HIGH (ref 70–99)
Potassium: 4.3 mmol/L (ref 3.5–5.2)
Sodium: 140 mmol/L (ref 134–144)
eGFR: 48 mL/min/{1.73_m2} — ABNORMAL LOW (ref >59)

## 2020-11-30 ENCOUNTER — Other Ambulatory Visit: Payer: Self-pay | Admitting: Internal Medicine

## 2020-11-30 ENCOUNTER — Encounter (HOSPITAL_COMMUNITY): Payer: Self-pay | Admitting: Cardiology

## 2020-11-30 DIAGNOSIS — R899 Unspecified abnormal finding in specimens from other organs, systems and tissues: Secondary | ICD-10-CM

## 2020-12-01 ENCOUNTER — Telehealth: Payer: Self-pay | Admitting: Cardiology

## 2020-12-01 NOTE — Telephone Encounter (Signed)
Charles Meredith Leeds, MD  11/30/2020  3:12 PM EDT     Stable preop labs. Needs to see PCP for elevated creatinine.    Pt states he was reading through his cardioversion instructions/AVS and it mentioned he needed pre-procedure labs done, however those were already done on 10/3, and pt made aware of those results. He is asking if he needs to come back in for pre-procedure labs prior to DCCV on 10/14.  Informed the pt that he does not need to come back in for repeat pre-procedure labs with our office, prior to his DCCV next week.  Advised the pt that per Dr. Curt Bears, based on his labs from 10/3, he said he had stable pre-op labs but wanted him to get in with his PCP in the near future, for follow-up of elevated creatinine. Pt states he is getting in with his PCP on 10/19, for follow-up of elevated creatinine and repeat BMET.  Pt verbalized understanding and agrees with this plan.  Pt was more than gracious for all the assistance provided.

## 2020-12-01 NOTE — Telephone Encounter (Signed)
Pt is currently scheduled for a procedure on  12/09/20, he is supposed to have blood work today but pt states he was in the office on 11/21/20 and he had blood work and ekg on that day. Pt wants to know if he should  still come today for more blood work. Please advise pt further

## 2020-12-09 ENCOUNTER — Ambulatory Visit (HOSPITAL_COMMUNITY)
Admission: RE | Admit: 2020-12-09 | Discharge: 2020-12-09 | Disposition: A | Discharge status: Home / Self Care | Payer: Medicare Other | Attending: Cardiology | Admitting: Cardiology

## 2020-12-09 ENCOUNTER — Ambulatory Visit (HOSPITAL_COMMUNITY): Payer: Medicare Other | Admitting: Critical Care Medicine

## 2020-12-09 ENCOUNTER — Encounter (HOSPITAL_COMMUNITY): Payer: Self-pay | Admitting: Cardiology

## 2020-12-09 ENCOUNTER — Other Ambulatory Visit: Payer: Self-pay

## 2020-12-09 ENCOUNTER — Encounter (HOSPITAL_COMMUNITY)
Admission: RE | Disposition: A | Discharge status: Home / Self Care | Payer: Self-pay | Source: Home / Self Care | Attending: Cardiology

## 2020-12-09 DIAGNOSIS — I110 Hypertensive heart disease with heart failure: Secondary | ICD-10-CM | POA: Insufficient documentation

## 2020-12-09 DIAGNOSIS — E785 Hyperlipidemia, unspecified: Secondary | ICD-10-CM | POA: Insufficient documentation

## 2020-12-09 DIAGNOSIS — Z79899 Other long term (current) drug therapy: Secondary | ICD-10-CM | POA: Insufficient documentation

## 2020-12-09 DIAGNOSIS — E119 Type 2 diabetes mellitus without complications: Secondary | ICD-10-CM | POA: Diagnosis not present

## 2020-12-09 DIAGNOSIS — I358 Other nonrheumatic aortic valve disorders: Secondary | ICD-10-CM | POA: Diagnosis not present

## 2020-12-09 DIAGNOSIS — Z7901 Long term (current) use of anticoagulants: Secondary | ICD-10-CM | POA: Diagnosis not present

## 2020-12-09 DIAGNOSIS — I5022 Chronic systolic (congestive) heart failure: Secondary | ICD-10-CM | POA: Insufficient documentation

## 2020-12-09 DIAGNOSIS — I4819 Other persistent atrial fibrillation: Secondary | ICD-10-CM | POA: Diagnosis not present

## 2020-12-09 HISTORY — PX: CARDIOVERSION: SHX1299

## 2020-12-09 SURGERY — CARDIOVERSION
Anesthesia: General

## 2020-12-09 MED ORDER — SODIUM CHLORIDE 0.9 % IV SOLN: INTRAVENOUS | Status: DC | PRN

## 2020-12-09 MED ORDER — LIDOCAINE 2% (20 MG/ML) 5 ML SYRINGE
INTRAMUSCULAR | Status: DC | PRN
  Administered 2020-12-09: 80 mg via INTRAVENOUS

## 2020-12-09 MED ORDER — PROPOFOL 10 MG/ML IV BOLUS
INTRAVENOUS | Status: DC | PRN
  Administered 2020-12-09: 110 mg via INTRAVENOUS

## 2020-12-09 NOTE — Procedures (Signed)
Electrical Cardioversion Procedure Note Charles Lloyd [id number] 07-11-1940  Procedure: Electrical Cardioversion Indications:  Atrial Fibrillation  Procedure Details Consent: Risks of procedure as well as the alternatives and risks of each were explained to the (patient/caregiver).  Consent for procedure obtained. Time Out: Verified patient identification, verified procedure, site/side was marked, verified correct patient position, special equipment/implants available, medications/allergies/relevent history reviewed, required imaging and test results available.  Performed  Patient placed on cardiac monitor, pulse oximetry, supplemental oxygen as necessary.  Sedation given:  Pt sedated by anesthesia with lidocaine 80 mg and diprovan 110 mg IV. Pacer pads placed anterior and posterior chest.  Cardioverted 2 time(s).  Cardioverted at 200J unsuccessful; repeat attempt with 200J and anterior manual pressure converted to NSR.  Evaluation Findings: Post procedure EKG shows: NSR Complications: None Patient did tolerate procedure well.   Kirk Ruths 12/09/2020, 9:02 AM

## 2020-12-09 NOTE — H&P (Signed)
Office Visit 11/21/2020 [facility] [place], Ocie Doyne, MD Cardiology Persistent atrial fibrillation [facility]) Dx Referred by Isaac Bliss, [name], MD Reason for Visit   Additional Documentation  Vitals:  BP 118/64 Pulse 113 Important   Resp 18 Ht 5\' 10"  (1.778 m) Wt 83.5 kg SpO2 98% BMI 26.40 kg/m BSA 2.03 m  Flowsheets:  Vital Signs, NEWS, MEWS Score, Anthropometrics   Encounter Info:  Billing Info, History, Allergies, Detailed Report    All Notes    Addendum Note by Stanton Kidney, RN at 11/21/2020 3:30 PM  Author: Stanton Kidney, RN Author Type: Registered Nurse Filed: 11/21/2020  4:21 PM  Note Status: Signed Cosign: Cosign Not Required Encounter Date: 11/21/2020  Editor: Stanton Kidney, RN (Registered Nurse)               Addended by: Stanton Kidney on: 11/21/2020 04:21 PM     Modules accepted: Orders         Patient Instructions by Stanton Kidney, RN at 11/21/2020 3:30 PM  Author: Stanton Kidney, RN Author Type: Registered Nurse Filed: 11/21/2020  4:10 PM  Note Status: Signed Cosign: Cosign Not Required Encounter Date: 11/21/2020  Editor: Stanton Kidney, RN (Registered Nurse)               Medication Instructions:  Your physician has recommended you make the following change in your medication:  START Flecainide 100 mg  twice a day   *If you need a refill on your cardiac medications before your next appointment, please call your pharmacy*     Lab Work: None ordered If you have labs (blood work) drawn today and your tests are completely normal, you will receive your results only by: [place] (if you have MyChart) OR A paper copy in the mail If you have any lab test that is abnormal or we need to change your treatment, we will call you to review the results.     Testing/Procedures: None ordered     Follow-Up: At [facility], you and your health needs are our priority.  As part of our continuing mission to  provide you with exceptional heart care, we have created designated Provider Care Teams.  These Care Teams include your primary Cardiologist (physician) and Advanced Practice Providers (APPs -  Physician Assistants and Nurse Practitioners) who all work together to provide you with the care you need, when you need it.   Your next appointment:   6 month(s)   The format for your next appointment:   In Person   Provider:   Allegra Lai, MD   You are scheduled for a follow up EKG on Monday 10/03 @ 1:45 pm in the [facility] [place]   Thank you for choosing [facility]!!    Trinidad Curet, RN [phone]     Other Instructions     Flecainide Tablets What is this medication? FLECAINIDE (FLEK a nide) prevents and treats a fast or irregular heartbeat (arrhythmia). It is often used to treat a type of arrhythmia known as AFib (atrial fibrillation). It works by slowing down overactive electric signals in the heart, which stabilizes your heart rhythm. It belongs to a group of medications called antiarrhythmics. This medicine may be used for other purposes; ask your health care provider or pharmacist if you have questions. COMMON BRAND NAME(S): Tambocor What should I tell my care team before I take this medication? They need to know if you have any of  these conditions: Abnormal levels of potassium in the blood Heart disease including heart rhythm and heart rate problems Kidney or liver disease Recent heart attack An unusual or allergic reaction to flecainide, local anesthetics, other medications, foods, dyes, or preservatives Pregnant or trying to get pregnant Breast-feeding How should I use this medication? Take this medication by mouth with a glass of water. Follow the directions on the prescription label. You can take this medication with or without food. Take your doses at regular intervals. Do not take your medication more often than directed. Do not stop taking this medication  suddenly. This may cause serious, heart-related side effects. If your care team wants you to stop the medication, the dose may be slowly lowered over time to avoid any side effects. Talk to your care team regarding the use of this medication in children. While this medication may be prescribed for children as young as 1 year of age for selected conditions, precautions do apply. Overdosage: If you think you have taken too much of this medicine contact a poison control center or emergency room at once. NOTE: This medicine is only for you. Do not share this medicine with others. What if I miss a dose? If you miss a dose, take it as soon as you can. If it is almost time for your next dose, take only that dose. Do not take double or extra doses. What may interact with this medication? Do not take this medication with any of the following: Amoxapine Arsenic trioxide Certain antibiotics like clarithromycin, erythromycin, gatifloxacin, gemifloxacin, levofloxacin, moxifloxacin, sparfloxacin, or troleandomycin Certain antidepressants called tricyclic antidepressants like amitriptyline, imipramine, or nortriptyline Certain medications to control heart rhythm like disopyramide, encainide, moricizine, procainamide, propafenone, and quinidine Cisapride Delavirdine Droperidol Haloperidol [place] Imatinib Levomethadyl Maprotiline Medications for malaria like chloroquine and halofantrine Pentamidine Phenothiazines like chlorpromazine, mesoridazine, prochlorperazine, thioridazine Pimozide Quinine Ranolazine Ritonavir Sertindole This medication may also interact with the following: Cimetidine Dofetilide Medications for angina or high blood pressure Medications to control heart rhythm like amiodarone and digoxin Ziprasidone This list may not describe all possible interactions. Give your health care provider a list of all the medicines, herbs, non-prescription drugs, or dietary supplements you use.  Also tell them if you smoke, drink alcohol, or use illegal drugs. Some items may interact with your medicine. What should I watch for while using this medication? Visit your care team for regular checks on your progress. Because your condition and the use of this medication carries some risk, it is a good idea to carry an identification card, necklace or bracelet with details of your condition, medications, and care team. Check your blood pressure and pulse rate regularly. Ask your care team what your blood pressure and pulse rate should be, and when you should contact them. Your care team also may schedule regular blood tests and electrocardiograms to check your progress. You may get drowsy or dizzy. Do not drive, use machinery, or do anything that needs mental alertness until you know how this medication affects you. Do not stand or sit up quickly, especially if you are an older patient. This reduces the risk of dizzy or fainting spells. Alcohol can make you more dizzy, increase flushing and rapid heartbeats. Avoid alcoholic drinks. What side effects may I notice from receiving this medication? Side effects that you should report to your care team as soon as possible: Allergic reactions-skin rash, itching, hives, swelling of the face, lips, tongue, or throat Heart failure-shortness of breath, swelling of the ankles, feet,  or hands, sudden weight gain, unusual weakness or fatigue Heart rhythm changes-fast or irregular heartbeat, dizziness, feeling faint or lightheaded, chest pain, trouble breathing Liver injury-right upper belly pain, loss of appetite, nausea, light-colored stool, dark yellow or brown urine, yellowing skin or eyes, unusual weakness or fatigue Side effects that usually do not require medical attention (report to your care team if they continue or are bothersome): Blurry vision Constipation Dizziness Fatigue Headache Nausea Tremors or shaking This list may not describe all possible  side effects. Call your doctor for medical advice about side effects. You may report side effects to FDA at 1-800-FDA-1088. Where should I keep my medication? Keep out of the reach of children and pets. Store at room temperature between 15 and 30 degrees C (59 and 86 degrees F). Protect from light. Keep container tightly closed. Throw away any unused medication after the expiration date. NOTE: This sheet is a summary. It may not cover all possible information. If you have questions about this medicine, talk to your doctor, pharmacist, or health care provider.  2022 Elsevier/Gold Standard (2020-03-17 12:17:39)          Progress Notes by [name], MD at 11/21/2020 3:30 PM  Author: [name], MD Author Type: Physician Filed: 11/21/2020  3:59 PM  Note Status: Signed Cosign: Cosign Not Required Encounter Date: 11/21/2020  Editor: [name], MD (Physician)                   Electrophysiology Office Note     Date:  11/21/2020    ID:  Charles Gum., DOB 05/10/1940, MRN [id number]   PCP:  Isaac Bliss, [name], MD         Cardiologist:  Bettina Gavia Primary Electrophysiologist:  Will Meredith Leeds, MD        Chief Complaint: atrial fibrillation    History of Present Illness: Tru Rana. is a 80 y.o. male who is being seen today for the evaluation of atrial fibrillation at the request of [name] Presenting today for electrophysiology evaluation.   He has a history of atrial fibrillation, moderate systolic heart failure, diabetes, hyperlipidemia.  He had ablation at [facility] in 2015 and cardioversion 2017.  He had repeat ablation 03/12/2019.  Unfortunately went back into atrial fibrillation status post ablation 04/29/20   Today, denies symptoms of palpitations, chest pain, shortness of breath, orthopnea, PND, lower extremity edema, claudication, dizziness, presyncope, syncope, bleeding, or neurologic sequela. The patient is tolerating medications  without difficulties.  Unfortunately has gone back into atrial fibrillation.  He overall feels okay, though he does note some dyspnea on exertion when walking up a hill or stairs as well as some fatigue.  He would like to get back into normal rhythm.       Past Medical History:  Diagnosis Date   Acute tracheobronchitis 01/05/2019   B12 deficiency     Basal cell carcinoma (BCC) of left side of nose 01/28/2018   Cancer ([place])     Cardiomyopathy ([place]) 03/10/2018    With chronic atrial fibrillation   CHF (congestive heart failure) ([place]) 04/16/2013    Functional class II, ejection fraction 35-40%  Formatting of this note might be different from the original. Functional class II, ejection fraction 35-40%   Chronic anticoagulation     Chronic diastolic (congestive) heart failure ([place]) 04/16/2013    Functional class II, ejection fraction 35-40%  Last Assessment & Plan:  Clinically stable Volume well controlled meds reviewed  Chronic diastolic CHF (congestive heart failure) ([place]) 04/16/2013    Functional class II, ejection fraction 35-40%  Last Assessment & Plan:  Clinically stable Volume well controlled meds reviewed   Colon polyps 04/16/2013   Diabetic neuropathy ([place])     Eosinophil count raised 02/17/2019   Essential hypertension 01/06/2010    Last Assessment & Plan:  Well controlled Continue med management   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  Steroid usage was discontinued several months ago.  Follow up with Dermatology as planned.   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    Last Assessment & Plan:  Id following  ?ongoing abx managementy reported Request records   Infection of prosthetic right knee joint ([place])     Insomnia 03/04/2012    Grief with loss of wife 02/20/12  Last Assessment & Plan:  Improved sx  contineu xanax prn Se discussed   Lesion of liver 04/20/2019    -On cardiac CT 02/2019   Mild reactive airways disease     Neoplasm of prostate 04/16/2013   Neoplasm of prostate, 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 Transthoracic Echocardiogram-03/10/[facility]: Normal left ventricular wall thickness and cavity size.  Global left ventricular systolic function is moderately reduced.  The estimated ejection fraction is 35-40%.  The left atrium is moderately enlarged.  The right atrium is mildly enlarged.  No significant valvular    Overweight (BMI 25.0-29.9) 10/22/2016   Persistent atrial fibrillation ([place]) 04/16/2013    Last Assessment & Plan:  Rate controlled Continue med management eliquis for stroke preventino  Formatting of this note might be different from the original.  Drug  HX Current Rx Pre-ABL inefficacy Pre-ABL intolerant Post-ABL inefficacy Post-ABL intolerant max dose/24h M/Y end comments  sotalol                  dofetilide                  flecainide                  propafenone                  am   S/P TKR (total knee replacement), bilateral     Secondary hypercoagulable state ([place]) 04/09/2019   Tinea cruris 04/16/2013   Type 2 diabetes mellitus with diabetic polyneuropathy, without long-term current use of insulin ([place]) 04/16/2013    Last Assessment & Plan:  Labs today Pt reports well controlled on ambulatory monitoring   Vitamin D deficiency 07/25/2018         Past Surgical  History:  Procedure Laterality Date   ATRIAL FIBRILLATION ABLATION N/A 03/12/2019    Procedure: ATRIAL FIBRILLATION ABLATION;  Surgeon: [name], MD;  Location: [place] [facility];  Service: Cardiovascular;  Laterality: N/A;  ATRIAL FIBRILLATION ABLATION N/A 04/29/2020    Procedure: ATRIAL FIBRILLATION ABLATION;  Surgeon: [name], MD;  Location: [place] [facility];  Service: Cardiovascular;  Laterality: N/A;   CARDIAC ELECTROPHYSIOLOGY STUDY AND ABLATION       CARDIOVERSION       CARDIOVERSION N/A 10/10/2018    Procedure: CARDIOVERSION;  Surgeon: [name], MD;  Location: [place];  Service: Cardiovascular;  Laterality: N/A;   CARDIOVERSION N/A 05/18/2020    Procedure: CARDIOVERSION;  Surgeon: [name], MD;  Location: [facility];  Service: Cardiovascular;  Laterality: N/A;   CHOLECYSTECTOMY       PROSTATECTOMY       REPLACEMENT TOTAL KNEE BILATERAL                  Current Outpatient Medications  Medication Sig Dispense Refill   albuterol (VENTOLIN HFA) 108 (90 Base) MCG/ACT inhaler Inhale 1-2 puffs into the lungs every 6 (six) hours as needed for wheezing or shortness of breath. 8 g 2   amoxicillin (AMOXIL) 500 MG capsule Take 2,000 mg by mouth See admin instructions. Take 4 capsules (2000 mg) by mouth 1 hour prior to dental procedures.       atorvastatin (LIPITOR) 20 MG tablet TAKE 1 TABLET DAILY AT 6 P.M. 90 tablet 3   cephALEXin (KEFLEX) 500 MG capsule Take 1 capsule (500 mg total) by mouth 3 (three) times daily. 21 capsule 0   ELIQUIS 5 MG TABS tablet TAKE 1 TABLET TWICE A DAY 180 tablet 3   fluticasone (FLONASE) 50 MCG/ACT nasal spray Place 2 sprays into both nostrils daily. 16 g 2   fluticasone furoate-vilanterol (BREO ELLIPTA) 100-25 MCG/INH AEPB Inhale 1 puff into the lungs daily. 60 each 11   gabapentin (NEURONTIN) 300 MG capsule TAKE 2 CAPSULES BY MOUTH AT BEDTIME 60 capsule 0   glucose blood (ACCU-CHEK AVIVA PLUS) test strip 1 each by  Other route daily. Dx E11.9 100 each 12   metFORMIN (GLUCOPHAGE) 500 MG tablet TAKE 1 TABLET TWICE A DAY WITH MEALS 180 tablet 3   montelukast (SINGULAIR) 10 MG tablet Take 10 mg by mouth at bedtime.        potassium chloride SA (KLOR-CON M20) 20 MEQ tablet Take 1 tablet (20 mEq total) by mouth daily. 90 tablet 3   RESTASIS 0.05 % ophthalmic emulsion Place 1 drop into both eyes 2 (two) times daily.       torsemide (DEMADEX) 20 MG tablet Take 1 tablet (20 mg total) by mouth daily as needed (fluid retention). 90 tablet 3   valsartan (DIOVAN) 160 MG tablet Take 1 tablet (160 mg total) by mouth daily. 90 tablet 3   amLODipine (NORVASC) 5 MG tablet Take 1 tablet (5 mg total) by mouth daily. 90 tablet 3   carvedilol (COREG) 12.5 MG tablet Take 1 tablet (12.5 mg total) by mouth 2 (two) times daily. 180 tablet 3    No current facility-administered medications for this visit.      Allergies:   Patient has no known allergies.    Social History:  The patient  reports that he has never smoked. He has never used smokeless tobacco. He reports current alcohol use of about 4.0 standard drinks per week. He reports that he does not use drugs.    Family History:  The patient's family history includes Cancer in his father and mother; Depression in his father and mother; Early death in his father and mother.    ROS:  Please see the history of present illness.   Otherwise, review of systems is positive for none.   All other systems are reviewed and negative.    PHYSICAL EXAM: VS:  BP 118/64   Pulse (!) 113   Resp 18   Ht 5\' 10"  (1.778 m)   Wt 184 lb (83.5 kg)   SpO2 98%   BMI 26.40 kg/m  , BMI Body mass index is 26.4 kg/m. GEN: Well nourished, well developed, in no acute distress  HEENT: normal  Neck: no JVD, carotid bruits, or masses Cardiac: Irregular, tachycardic; no murmurs, rubs, or gallops,no edema  Respiratory:  clear to auscultation bilaterally, normal work of breathing GI: soft, nontender,  nondistended, + BS MS: no deformity or atrophy  Skin: warm and dry Neuro:  Strength and sensation are intact Psych: euthymic mood, full affect   EKG:  EKG is ordered today. Personal review of the ekg ordered shows atrial fibrillation, PVCs, rate 113     Recent Labs: 01/29/2020: Magnesium 1.5; TSH 0.912 06/10/2020: BUN 14; Creatinine, Ser 0.99; Hemoglobin 10.7; NT-Pro BNP 2,486; Platelets 183; Potassium 4.6; Sodium 138      Lipid Panel          Component Value Date/Time    CHOL 120 05/20/2020 0914    TRIG 73 05/20/2020 0914    HDL 46 05/20/2020 0914    CHOLHDL 2.6 05/20/2020 0914    CHOLHDL 3 07/24/2018 1058    VLDL 25.0 07/24/2018 1058    LDLCALC 59 05/20/2020 0914           Wt Readings from Last 3 Encounters:  11/21/20 184 lb (83.5 kg)  10/05/20 183 lb 6.4 oz (83.2 kg)  10/03/20 183 lb 14.4 oz (83.4 kg)        Other studies Reviewed: Additional studies/ records that were reviewed today include: TTE 10/07/18  Review of the above records today demonstrates:   1. The left ventricle has low normal systolic function, with an ejection fraction of 50-55%. The cavity size was normal. Left ventricular diastolic function could not be evaluated secondary to atrial fibrillation. No evidence of left ventricular  regional wall motion abnormalities.  2. The right ventricle has normal systolic function. The cavity was normal. There is no increase in right ventricular wall thickness. Right ventricular systolic pressure is normal.  3. There is mild mitral annular calcification present.  4. The aortic valve is tricuspid. Moderate sclerosis of the aortic valve. Aortic valve regurgitation was not assessed by color flow Doppler.  5. The aorta is normal in size and structure.  6. There is right bowing of the interatrial septum, suggestive of elevated left atrial pressure.   Myoview 05/25/19 Nuclear stress EF: 60%. No T wave inversion was noted during stress. There was no ST segment deviation  noted during stress. This is a low risk study.   Normal perfusion. LVEF 60% with normal wall motion. This is a low risk study. No prior for comparison.   ASSESSMENT AND PLAN:   1.  Persistent atrial fibrillation: Currently on Eliquis.  Status post ablation 03/02/2019 with repeat ablation 04/29/2020.  CHA2DS2-VASc of 4.  Did require cardioversion 04/28/2020.  He is unfortunately back in atrial fibrillation.  Due to that, we will start him on flecainide 100 mg twice daily.  We will have him come back next week for an ECG.  If he is not back in normal rhythm, we will plan for cardioversion.   2.  Chronic systolic heart failure: Ejection fraction  is improved.  No obvious volume overload.   3.  Hypertension: Currently well controlled   4.  Hyperlipidemia: Continue statin per primary cardiology.     Current medicines are reviewed at length with the patient today.   The patient does not have concerns regarding his medicines.  The following changes were made today: Start flecainide   Labs/ tests ordered today include:     Orders Placed This Encounter  Procedures   EKG 12-Lead          Disposition:   FU with Will Camnitz 3 months   Signed, Will Meredith Leeds, MD  11/21/2020 3:59 PM     [place] [address] [phone] (office) [phone] (fax)      For DCCV; compliant with apixaban; no changes. Kirk Ruths

## 2020-12-09 NOTE — Interval H&P Note (Signed)
History and Physical Interval Note:  12/09/2020 9:01 AM  [name]  has presented today for surgery, with the diagnosis of AFIB.  The various methods of treatment have been discussed with the patient and family. After consideration of risks, benefits and other options for treatment, the patient has consented to  Procedure(s): CARDIOVERSION (N/A) as a surgical intervention.  The patient's history has been reviewed, patient examined, no change in status, stable for surgery.  I have reviewed the patient's chart and labs.  Questions were answered to the patient's satisfaction.     Kirk Ruths

## 2020-12-09 NOTE — Transfer of Care (Signed)
Immediate Anesthesia Transfer of Care Note  Patient: Charles Lloyd.  Procedure(s) Performed: CARDIOVERSION  Patient Location: Endoscopy Unit  Anesthesia Type:General  Level of Consciousness: awake, alert  and oriented  Airway & Oxygen Therapy: Patient Spontanous Breathing  Post-op Assessment: Report given to RN and Post -op Vital signs reviewed and stable  Post vital signs: Reviewed and stable  Last Vitals:  Vitals Value Taken Time  BP 91/55   Temp    Pulse 66   Resp 15   SpO2 100     Last Pain:  Vitals:   12/09/20 0925  TempSrc: Temporal  PainSc: 0-No pain         Complications: No notable events documented.

## 2020-12-09 NOTE — Anesthesia Postprocedure Evaluation (Signed)
Anesthesia Post Note  Patient: Charles Lloyd.  Procedure(s) Performed: CARDIOVERSION     Patient location during evaluation: PACU Anesthesia Type: General Level of consciousness: awake and alert Pain management: pain level controlled Vital Signs Assessment: post-procedure vital signs reviewed and stable Respiratory status: spontaneous breathing, nonlabored ventilation and respiratory function stable Cardiovascular status: blood pressure returned to baseline and stable Postop Assessment: no apparent nausea or vomiting Anesthetic complications: no   No notable events documented.  Last Vitals:  Vitals:   12/09/20 0925 12/09/20 0950  BP: 109/64 (!) 107/51  Pulse: (!) 104 67  Resp: 16 17  Temp: 36.6 C 36.5 C  SpO2: 98% 98%    Last Pain:  Vitals:   12/09/20 0950  TempSrc: Oral  PainSc: 0-No pain                 Lynda Rainwater

## 2020-12-09 NOTE — Anesthesia Preprocedure Evaluation (Addendum)
Anesthesia Evaluation  Patient identified by MRN, date of birth, ID band Patient awake    Reviewed: Allergy & Precautions, NPO status , Patient's Chart, lab work & pertinent test results  Airway Mallampati: II  TM Distance: >3 FB Neck ROM: Full    Dental no notable dental hx.    Pulmonary asthma ,    Pulmonary exam normal breath sounds clear to auscultation       Cardiovascular hypertension, Pt. on medications +CHF  Normal cardiovascular exam+ dysrhythmias  Rhythm:Regular Rate:Normal     Neuro/Psych negative neurological ROS  negative psych ROS   GI/Hepatic negative GI ROS, Neg liver ROS,   Endo/Other  negative endocrine ROSdiabetes  Renal/GU negative Renal ROS  negative genitourinary   Musculoskeletal  (+) Arthritis , Osteoarthritis,    Abdominal   Peds negative pediatric ROS (+)  Hematology negative hematology ROS (+)   Anesthesia Other Findings   Reproductive/Obstetrics negative OB ROS                             Anesthesia Physical Anesthesia Plan  ASA: 3  Anesthesia Plan: General   Post-op Pain Management:    Induction: Intravenous  PONV Risk Score and Plan: 2 and Treatment may vary due to age or medical condition  Airway Management Planned: Natural Airway  Additional Equipment:   Intra-op Plan:   Post-operative Plan:   Informed Consent: I have reviewed the patients History and Physical, chart, labs and discussed the procedure including the risks, benefits and alternatives for the proposed anesthesia with the patient or authorized representative who has indicated his/her understanding and acceptance.     Dental advisory given  Plan Discussed with: CRNA  Anesthesia Plan Comments:       Anesthesia Quick Evaluation

## 2020-12-09 NOTE — Discharge Instructions (Signed)

## 2020-12-09 NOTE — Anesthesia Procedure Notes (Signed)
Procedure Name: General with mask airway Date/Time: 12/09/2020 9:41 AM Performed by: Wilburn Cornelia, CRNA Pre-anesthesia Checklist: Patient identified, Emergency Drugs available, Suction available, Patient being monitored and Timeout performed Patient Re-evaluated:Patient Re-evaluated prior to induction Oxygen Delivery Method: Ambu bag Preoxygenation: Pre-oxygenation with 100% oxygen Induction Type: IV induction Ventilation: Mask ventilation without difficulty Placement Confirmation: breath sounds checked- equal and bilateral Dental Injury: Teeth and Oropharynx as per pre-operative assessment

## 2020-12-11 ENCOUNTER — Encounter (HOSPITAL_COMMUNITY): Payer: Self-pay | Admitting: Cardiology

## 2020-12-11 ENCOUNTER — Other Ambulatory Visit: Payer: Self-pay | Admitting: Internal Medicine

## 2020-12-11 DIAGNOSIS — G6289 Other specified polyneuropathies: Secondary | ICD-10-CM

## 2020-12-14 ENCOUNTER — Other Ambulatory Visit: Payer: Self-pay

## 2020-12-14 ENCOUNTER — Other Ambulatory Visit (INDEPENDENT_AMBULATORY_CARE_PROVIDER_SITE_OTHER): Payer: Medicare Other

## 2020-12-14 DIAGNOSIS — R899 Unspecified abnormal finding in specimens from other organs, systems and tissues: Secondary | ICD-10-CM | POA: Diagnosis not present

## 2020-12-14 DIAGNOSIS — N289 Disorder of kidney and ureter, unspecified: Secondary | ICD-10-CM

## 2020-12-14 LAB — BASIC METABOLIC PANEL
BUN: 19 mg/dL (ref 6–23)
CO2: 27 mEq/L (ref 19–32)
Calcium: 9.3 mg/dL (ref 8.4–10.5)
Chloride: 102 mEq/L (ref 96–112)
Creatinine, Ser: 1.44 mg/dL (ref 0.40–1.50)
GFR: 46.73 mL/min — ABNORMAL LOW (ref >60.00)
Glucose, Bld: 111 mg/dL — ABNORMAL HIGH (ref 70–99)
Potassium: 4.3 mEq/L (ref 3.5–5.1)
Sodium: 138 mEq/L (ref 135–145)

## 2020-12-15 ENCOUNTER — Telehealth: Payer: Self-pay | Admitting: Internal Medicine

## 2020-12-15 MED ORDER — APIXABAN 5 MG PO TABS: 5.0000 mg | ORAL_TABLET | Freq: Two times a day (BID) | ORAL | 1 refills | Status: DC

## 2020-12-15 NOTE — Telephone Encounter (Signed)
Pt call and stated he have no more refill and want a new RX  for ELIQUIS 5 MG TABS tablet  sent to  [facility], [place] Phone:  [phone]  Fax:  [phone]

## 2020-12-15 NOTE — Telephone Encounter (Signed)
Refill sent.

## 2020-12-16 ENCOUNTER — Other Ambulatory Visit: Payer: Self-pay | Admitting: Internal Medicine

## 2020-12-16 DIAGNOSIS — N179 Acute kidney failure, unspecified: Secondary | ICD-10-CM

## 2020-12-27 ENCOUNTER — Ambulatory Visit (INDEPENDENT_AMBULATORY_CARE_PROVIDER_SITE_OTHER): Payer: Medicare Other | Admitting: Family Medicine

## 2020-12-27 ENCOUNTER — Encounter: Payer: Self-pay | Admitting: Family Medicine

## 2020-12-27 ENCOUNTER — Other Ambulatory Visit: Payer: Self-pay

## 2020-12-27 VITALS — BP 120/76 | HR 79 | Resp 16 | Ht 70.0 in | Wt 188.5 lb

## 2020-12-27 DIAGNOSIS — L03011 Cellulitis of right finger: Secondary | ICD-10-CM | POA: Diagnosis not present

## 2020-12-27 MED ORDER — SULFAMETHOXAZOLE-TRIMETHOPRIM 800-160 MG PO TABS: 1.0000 | ORAL_TABLET | Freq: Two times a day (BID) | ORAL | 0 refills | Status: DC

## 2020-12-27 NOTE — Patient Instructions (Signed)
A few things to remember from today's visit:  Cellulitis of finger of right hand - Plan: sulfamethoxazole-trimethoprim (BACTRIM DS) 800-160 MG tablet  Start antibiotic and complete 7 days. Keep hand mildly elevated. Continue monitoring blood sugars. Soak finger in warm water with epson salt 2 times daily for 5 min. Follow with Dr Jerilee Hoh if not greatly improve in 72 hours.  Do not use My Chart to request refills or for acute issues that need immediate attention.    Please be sure medication list is accurate. If a new problem present, please set up appointment sooner than planned today.

## 2020-12-27 NOTE — Progress Notes (Signed)
ACUTE VISIT Chief Complaint  Patient presents with   swollen finger    X a couple days, no known injury, had cellulitis in his feet before and it feels like that again.   HPI: [name] is a 80 y.o. right handed male with history of atrial fibrillation on chronic anticoagulation, CHF, hypertension, DM 2, and OA here today complaining of a couple days of pain in his finger.  2-3 days ago started with edema,erythema,and pain on distal phalange 3rd finger. Gradual onset. Sore like pain, constant, 10/10. Pain is exacerbated by light touch. It is interfering with sleep.  Problem is getting worse. Negative for fever, chills, change in appetite, numbness, tingling, or cyanosis. No history of goat. No known trauma. He has not tried OTC medications.  DM TK:[id number] BS' as "good."  Lab Results  Component Value Date   HGBA1C 6.6 (A) 03/25/2020    Estimated Creatinine Clearance: 44.4 mL/min (by C-G formula based on SCr of 1.44 mg/dL).  Review of Systems  HENT:  Negative for mouth sores and sore throat.   Respiratory:  Negative for choking, shortness of breath and wheezing.   Cardiovascular:  Negative for chest pain and palpitations.  Gastrointestinal:  Negative for abdominal pain, nausea and vomiting.  Skin:  Negative for pallor and wound.  Neurological:  Negative for syncope and weakness.  Rest see pertinent positives and negatives per HPI.  Current Outpatient Medications on File Prior to Visit  Medication Sig Dispense Refill   albuterol (VENTOLIN HFA) 108 (90 Base) MCG/ACT inhaler Inhale 1-2 puffs into the lungs every 6 (six) hours as needed for wheezing or shortness of breath. 8 g 2   amoxicillin (AMOXIL) 500 MG capsule Take 2,000 mg by mouth See admin instructions. Take 4 capsules (2000 mg) by mouth 1 hour prior to dental procedures.     apixaban (ELIQUIS) 5 MG TABS tablet Take 1 tablet (5 mg total) by mouth 2 (two) times daily. 180 tablet 1   atorvastatin  (LIPITOR) 20 MG tablet TAKE 1 TABLET DAILY AT 6 P.M. 90 tablet 3   flecainide (TAMBOCOR) 100 MG tablet Take 1 tablet (100 mg total) by mouth 2 (two) times daily. 60 tablet 3   fluticasone (FLONASE) 50 MCG/ACT nasal spray Place 2 sprays into both nostrils daily. 16 g 2   fluticasone furoate-vilanterol (BREO ELLIPTA) 100-25 MCG/INH AEPB Inhale 1 puff into the lungs daily. 60 each 11   gabapentin (NEURONTIN) 300 MG capsule TAKE 2 CAPSULES BY MOUTH AT BEDTIME 60 capsule 0   glucose blood (ACCU-CHEK AVIVA PLUS) test strip 1 each by Other route daily. Dx E11.9 100 each 12   Lactobacillus Rhamnosus, GG, (PROBIOTIC COLIC PO) Take 2 tablets by mouth daily. Nature Bounty Gummies     metFORMIN (GLUCOPHAGE) 500 MG tablet TAKE 1 TABLET TWICE A DAY WITH MEALS 180 tablet 3   montelukast (SINGULAIR) 10 MG tablet Take 10 mg by mouth at bedtime.      Multiple Vitamins-Minerals (MULTIVITAMIN WITH MINERALS) tablet Take 2 tablets by mouth daily. Gummies     potassium chloride SA (KLOR-CON M20) 20 MEQ tablet Take 1 tablet (20 mEq total) by mouth daily. 90 tablet 3   RESTASIS 0.05 % ophthalmic emulsion Place 1 drop into both eyes 2 (two) times daily.     saccharomyces boulardii (FLORASTOR) 250 MG capsule Take 1 capsule (250 mg total) by mouth 2 (two) times daily. 180 capsule 0   torsemide (DEMADEX) 20 MG tablet Take 1 tablet (20  mg total) by mouth daily as needed (fluid retention). (Patient taking differently: Take 20 mg by mouth daily.) 90 tablet 3   valsartan (DIOVAN) 160 MG tablet Take 1 tablet (160 mg total) by mouth daily. 90 tablet 3   amLODipine (NORVASC) 5 MG tablet Take 1 tablet (5 mg total) by mouth daily. 90 tablet 3   carvedilol (COREG) 12.5 MG tablet Take 1 tablet (12.5 mg total) by mouth 2 (two) times daily. 180 tablet 3   No current facility-administered medications on file prior to visit.     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 Functional class II, ejection fraction 35-40%   Chronic anticoagulation    Chronic diastolic (congestive) heart failure ([place]) 04/16/2013   Functional class II, ejection fraction 35-40%  Last Assessment & Plan:  Clinically stable Volume well controlled meds reviewed   Chronic diastolic CHF (congestive heart failure) ([place]) 04/16/2013   Functional class II, ejection fraction 35-40%  Last Assessment & Plan:  Clinically stable Volume well controlled meds reviewed   Colon polyps 04/16/2013   Diabetic neuropathy (HCC)    Eosinophil count raised 02/17/2019   Essential hypertension 01/06/2010   Last Assessment & Plan:  Well controlled Continue med management   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  Steroid usage was discontinued several months ago.  Follow up with Dermatology as planned.   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   Last Assessment & Plan:  Id following ?ongoing abx managementy reported Request records   Infection of prosthetic right knee joint ([place])    Insomnia 03/04/2012   Grief with loss of wife 02/20/12  Last Assessment & Plan:  Improved sx  contineu xanax prn Se discussed   Lesion of liver 04/20/2019   -On cardiac CT 02/2019   Mild reactive airways disease    Neoplasm of prostate 04/16/2013   Neoplasm of prostate,  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 Transthoracic Echocardiogram-03/10/2013-[facility]: Normal left ventricular wall thickness and cavity size.  Global left ventricular systolic function is moderately reduced.  The estimated ejection fraction is 35-40%.  The left atrium is moderately enlarged.  The right atrium is mildly enlarged.  No significant valvular    Overweight (BMI 25.0-29.9) 10/22/2016   Persistent atrial fibrillation ([place]) 04/16/2013   Last Assessment & Plan:  Rate controlled Continue med management eliquis for stroke preventino  Formatting of this note might be different from the original.  Drug  HX Current Rx Pre-ABL inefficacy  Pre-ABL intolerant Post-ABL inefficacy Post-ABL intolerant max dose/24h M/Y end comments  sotalol                  dofetilide                  flecainide                  propafenone                  am   S/P TKR (total knee replacement), bilateral    Secondary hypercoagulable state ([place]) 04/09/2019   Tinea cruris 04/16/2013   Type 2 diabetes mellitus with diabetic polyneuropathy, without long-term current use of insulin ([place]) 04/16/2013   Last Assessment & Plan:  Labs today Pt reports well controlled on ambulatory monitoring   Vitamin D deficiency 07/25/2018   No Known Allergies  Social History   Socioeconomic History   Marital status: Widowed    Spouse name: Not on file   Number of children: Not on file   Years of education: Not on file   Highest education level: Not on file  Occupational History   Not on file  Tobacco Use   Smoking status: Never   Smokeless tobacco: Never  Vaping Use   Vaping Use: Never used  Substance and Sexual Activity    Alcohol use: Yes    Alcohol/week: 4.0 standard drinks    Types: 2 Glasses of wine, 2 Standard drinks or equivalent per week    Comment: daily   Drug use: Never   Sexual activity: Not Currently  Other Topics Concern   Not on file  Social History Narrative   Not on file   Social Determinants of Health   Financial Resource Strain: Not on file  Food Insecurity: Not on file  Transportation Needs: Not on file  Physical Activity: Not on file  Stress: Not on file  Social Connections: Not on file   Vitals:   12/27/20 0657  BP: 120/76  Pulse: 79  Resp: 16  SpO2: 98%   Body mass index is 27.05 kg/m.  Physical Exam Vitals and nursing note reviewed.  Constitutional:      General: He is not in acute distress.    Appearance: He is well-developed.  HENT:     Head: Normocephalic and atraumatic.  Eyes:     Conjunctiva/sclera: Conjunctivae normal.  Neck:     Trachea: No tracheal deviation.  Cardiovascular:     Rate and Rhythm: Normal rate and regular rhythm.     Heart sounds: No murmur heard. Pulmonary:     Effort: Pulmonary effort is normal. No respiratory distress.     Breath sounds: Normal breath sounds.  Musculoskeletal:     Right hand: Tenderness present. No bony tenderness. Decreased range of motion (DIP joint.). Normal sensation. Normal capillary refill. Normal pulse.       Hands:     Comments: 3rd finger: DIP tenderness with palpation, small nodules(Heberden's node). Erythema that compromised distal phalange and periungual area. Most tenderness localized around periungual area. Local heat. No induration or fluctuant areas. No drainage.    Lymphadenopathy:     Cervical: No cervical adenopathy.  Skin:    General: Skin is warm.     Findings: Erythema present. No rash.  Neurological:     Mental Status: He is alert and oriented to person, place, and time.  Psychiatric:     Comments: Well groomed, good eye contact.   ASSESSMENT AND PLAN:  Mr.Graham was seen today  for swollen finger.  Diagnoses and all orders for this visit:  Cellulitis of finger of right hand -     sulfamethoxazole-trimethoprim (BACTRIM DS) 800-160 MG tablet; Take 1 tablet by mouth 2 (two) times daily for 7 days.  We discussed differential Dx's. Recommend course of oral abx, Bactrim. Continue daily probiotic. Soak finger in warm water with Epson salt a couple times per day for 5 minutes. Monitor for new symptoms. Instructed about warning signs. Recommend follow-up with PCP if there is not great improvement in 48 to 72 hours.  Return if symptoms worsen or fail to improve.  Lucca Greggs G. [place], MD  [facility]. [place] office.

## 2020-12-29 ENCOUNTER — Other Ambulatory Visit: Payer: Self-pay

## 2020-12-30 ENCOUNTER — Emergency Department (HOSPITAL_COMMUNITY): Payer: Medicare Other

## 2020-12-30 ENCOUNTER — Other Ambulatory Visit: Payer: Medicare Other

## 2020-12-30 ENCOUNTER — Other Ambulatory Visit: Payer: Self-pay

## 2020-12-30 ENCOUNTER — Inpatient Hospital Stay (HOSPITAL_COMMUNITY)
Admission: EM | Admit: 2020-12-30 | Discharge: 2021-01-01 | DRG: 683 | Disposition: A | Discharge status: Home Health | Payer: Medicare Other | Attending: Internal Medicine | Admitting: Internal Medicine

## 2020-12-30 ENCOUNTER — Ambulatory Visit: Payer: Medicare Other | Admitting: Adult Health

## 2020-12-30 ENCOUNTER — Inpatient Hospital Stay (HOSPITAL_COMMUNITY): Payer: Medicare Other

## 2020-12-30 DIAGNOSIS — N179 Acute kidney failure, unspecified: Secondary | ICD-10-CM | POA: Diagnosis not present

## 2020-12-30 DIAGNOSIS — Z7951 Long term (current) use of inhaled steroids: Secondary | ICD-10-CM | POA: Diagnosis not present

## 2020-12-30 DIAGNOSIS — Z7984 Long term (current) use of oral hypoglycemic drugs: Secondary | ICD-10-CM

## 2020-12-30 DIAGNOSIS — N1832 Chronic kidney disease, stage 3b: Secondary | ICD-10-CM

## 2020-12-30 DIAGNOSIS — Z7901 Long term (current) use of anticoagulants: Secondary | ICD-10-CM | POA: Diagnosis not present

## 2020-12-30 DIAGNOSIS — Z79899 Other long term (current) drug therapy: Secondary | ICD-10-CM | POA: Diagnosis not present

## 2020-12-30 DIAGNOSIS — Z85828 Personal history of other malignant neoplasm of skin: Secondary | ICD-10-CM

## 2020-12-30 DIAGNOSIS — E871 Hypo-osmolality and hyponatremia: Secondary | ICD-10-CM | POA: Diagnosis present

## 2020-12-30 DIAGNOSIS — E559 Vitamin D deficiency, unspecified: Secondary | ICD-10-CM | POA: Diagnosis present

## 2020-12-30 DIAGNOSIS — M48061 Spinal stenosis, lumbar region without neurogenic claudication: Secondary | ICD-10-CM | POA: Diagnosis present

## 2020-12-30 DIAGNOSIS — I5032 Chronic diastolic (congestive) heart failure: Secondary | ICD-10-CM | POA: Diagnosis present

## 2020-12-30 DIAGNOSIS — Z96653 Presence of artificial knee joint, bilateral: Secondary | ICD-10-CM | POA: Diagnosis present

## 2020-12-30 DIAGNOSIS — E1142 Type 2 diabetes mellitus with diabetic polyneuropathy: Secondary | ICD-10-CM | POA: Diagnosis present

## 2020-12-30 DIAGNOSIS — M4802 Spinal stenosis, cervical region: Secondary | ICD-10-CM | POA: Diagnosis present

## 2020-12-30 DIAGNOSIS — E7800 Pure hypercholesterolemia, unspecified: Secondary | ICD-10-CM | POA: Diagnosis present

## 2020-12-30 DIAGNOSIS — I110 Hypertensive heart disease with heart failure: Secondary | ICD-10-CM | POA: Diagnosis present

## 2020-12-30 DIAGNOSIS — E538 Deficiency of other specified B group vitamins: Secondary | ICD-10-CM | POA: Diagnosis present

## 2020-12-30 DIAGNOSIS — Z23 Encounter for immunization: Secondary | ICD-10-CM | POA: Diagnosis present

## 2020-12-30 DIAGNOSIS — Z20822 Contact with and (suspected) exposure to covid-19: Secondary | ICD-10-CM | POA: Diagnosis present

## 2020-12-30 DIAGNOSIS — J449 Chronic obstructive pulmonary disease, unspecified: Secondary | ICD-10-CM | POA: Diagnosis present

## 2020-12-30 DIAGNOSIS — I429 Cardiomyopathy, unspecified: Secondary | ICD-10-CM | POA: Diagnosis present

## 2020-12-30 DIAGNOSIS — Z8546 Personal history of malignant neoplasm of prostate: Secondary | ICD-10-CM

## 2020-12-30 DIAGNOSIS — E860 Dehydration: Secondary | ICD-10-CM | POA: Diagnosis present

## 2020-12-30 DIAGNOSIS — L02511 Cutaneous abscess of right hand: Secondary | ICD-10-CM | POA: Diagnosis present

## 2020-12-30 DIAGNOSIS — I480 Paroxysmal atrial fibrillation: Secondary | ICD-10-CM | POA: Diagnosis present

## 2020-12-30 DIAGNOSIS — Z8673 Personal history of transient ischemic attack (TIA), and cerebral infarction without residual deficits: Secondary | ICD-10-CM | POA: Diagnosis not present

## 2020-12-30 DIAGNOSIS — I482 Chronic atrial fibrillation, unspecified: Secondary | ICD-10-CM | POA: Diagnosis present

## 2020-12-30 DIAGNOSIS — M4800 Spinal stenosis, site unspecified: Secondary | ICD-10-CM | POA: Diagnosis not present

## 2020-12-30 DIAGNOSIS — Z923 Personal history of irradiation: Secondary | ICD-10-CM

## 2020-12-30 DIAGNOSIS — T368X5A Adverse effect of other systemic antibiotics, initial encounter: Secondary | ICD-10-CM | POA: Diagnosis present

## 2020-12-30 DIAGNOSIS — L03011 Cellulitis of right finger: Secondary | ICD-10-CM | POA: Diagnosis present

## 2020-12-30 DIAGNOSIS — E119 Type 2 diabetes mellitus without complications: Secondary | ICD-10-CM | POA: Diagnosis not present

## 2020-12-30 DIAGNOSIS — Z794 Long term (current) use of insulin: Secondary | ICD-10-CM

## 2020-12-30 DIAGNOSIS — R296 Repeated falls: Secondary | ICD-10-CM | POA: Insufficient documentation

## 2020-12-30 DIAGNOSIS — I10 Essential (primary) hypertension: Secondary | ICD-10-CM | POA: Diagnosis not present

## 2020-12-30 DIAGNOSIS — E1169 Type 2 diabetes mellitus with other specified complication: Secondary | ICD-10-CM | POA: Diagnosis present

## 2020-12-30 LAB — GLUCOSE, CAPILLARY: Glucose-Capillary: 122 mg/dL — ABNORMAL HIGH (ref 70–99)

## 2020-12-30 LAB — CBC WITH DIFFERENTIAL/PLATELET
Abs Immature Granulocytes: 0.03 10*3/uL (ref 0.00–0.07)
Basophils Absolute: 0 10*3/uL (ref 0.0–0.1)
Basophils Relative: 1 %
Eosinophils Absolute: 0.3 10*3/uL (ref 0.0–0.5)
Eosinophils Relative: 5 %
HCT: 29.5 % — ABNORMAL LOW (ref 39.0–52.0)
Hemoglobin: 10.1 g/dL — ABNORMAL LOW (ref 13.0–17.0)
Immature Granulocytes: 1 %
Lymphocytes Relative: 11 %
Lymphs Abs: 0.7 10*3/uL (ref 0.7–4.0)
MCH: 30.3 pg (ref 26.0–34.0)
MCHC: 34.2 g/dL (ref 30.0–36.0)
MCV: 88.6 fL (ref 80.0–100.0)
Monocytes Absolute: 0.7 10*3/uL (ref 0.1–1.0)
Monocytes Relative: 10 %
Neutro Abs: 4.7 10*3/uL (ref 1.7–7.7)
Neutrophils Relative %: 72 %
Platelets: 145 10*3/uL — ABNORMAL LOW (ref 150–400)
RBC: 3.33 MIL/uL — ABNORMAL LOW (ref 4.22–5.81)
RDW: 14.1 % (ref 11.5–15.5)
WBC: 6.4 10*3/uL (ref 4.0–10.5)
nRBC: 0 % (ref 0.0–0.2)

## 2020-12-30 LAB — BASIC METABOLIC PANEL
Anion gap: 11 (ref 5–15)
BUN: 25 mg/dL — ABNORMAL HIGH (ref 8–23)
CO2: 23 mmol/L (ref 22–32)
Calcium: 8.9 mg/dL (ref 8.9–10.3)
Chloride: 96 mmol/L — ABNORMAL LOW (ref 98–111)
Creatinine, Ser: 2.6 mg/dL — ABNORMAL HIGH (ref 0.61–1.24)
GFR, Estimated: 25 mL/min — ABNORMAL LOW (ref >60)
Glucose, Bld: 101 mg/dL — ABNORMAL HIGH (ref 70–99)
Potassium: 3.7 mmol/L (ref 3.5–5.1)
Sodium: 130 mmol/L — ABNORMAL LOW (ref 135–145)

## 2020-12-30 LAB — HEMOGLOBIN A1C
Hgb A1c MFr Bld: 6.3 % — ABNORMAL HIGH (ref 4.8–5.6)
Mean Plasma Glucose: 134.11 mg/dL

## 2020-12-30 LAB — URINALYSIS, ROUTINE W REFLEX MICROSCOPIC
Bilirubin Urine: NEGATIVE
Glucose, UA: NEGATIVE mg/dL
Hgb urine dipstick: NEGATIVE
Ketones, ur: NEGATIVE mg/dL
Leukocytes,Ua: NEGATIVE
Nitrite: NEGATIVE
Protein, ur: NEGATIVE mg/dL
Specific Gravity, Urine: 1.012 (ref 1.005–1.030)
pH: 6 (ref 5.0–8.0)

## 2020-12-30 LAB — RESP PANEL BY RT-PCR (FLU A&B, COVID) ARPGX2
Influenza A by PCR: NEGATIVE
Influenza B by PCR: NEGATIVE
SARS Coronavirus 2 by RT PCR: NEGATIVE

## 2020-12-30 IMAGING — CT CT CERVICAL SPINE W/O CM
3 of 4 series · 12 of 33 positions shown, 14 images · non-contrast
Comparison: None

CLINICAL DATA: Status post fall.

EXAM:
CT HEAD WITHOUT CONTRAST
CT CERVICAL SPINE WITHOUT CONTRAST
TECHNIQUE: Multidetector CT imaging of the head and cervical spine was
performed following the standard protocol without intravenous
contrast. Multiplanar CT image reconstructions of the cervical spine
were also generated.

[Series 6: c_spine 2.0 st · axial · 0.32mm/px · z∈[-237,-113]mm · 4 of 88 slices shown, 5 images]
[im 13/88  soft-tissue]
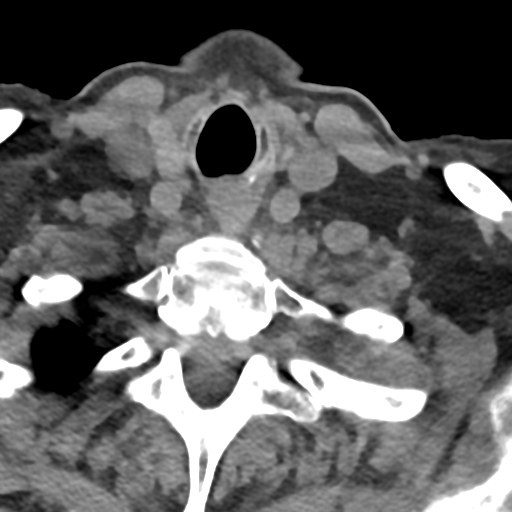
[im 13/88  bone]
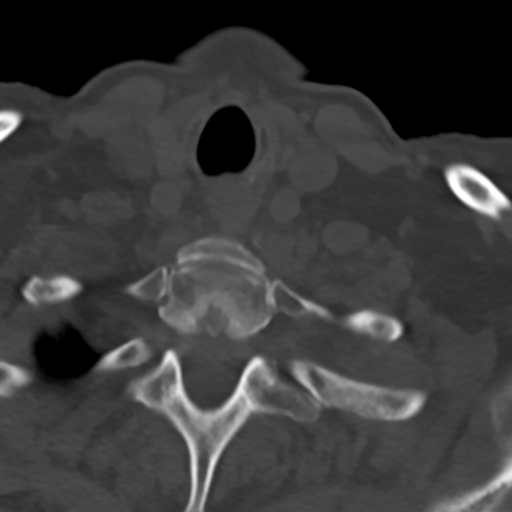
[im 38/88  bone]
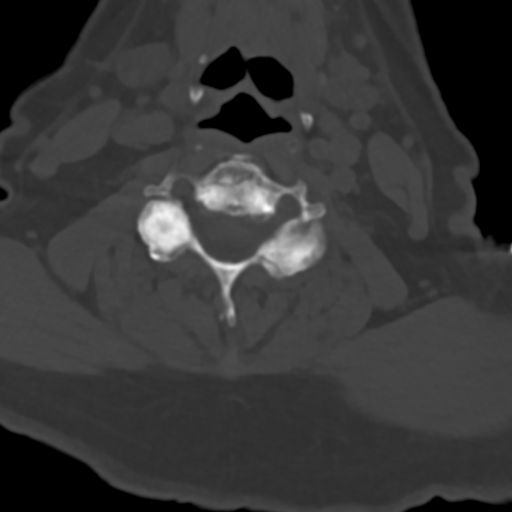
[im 50/88  bone]
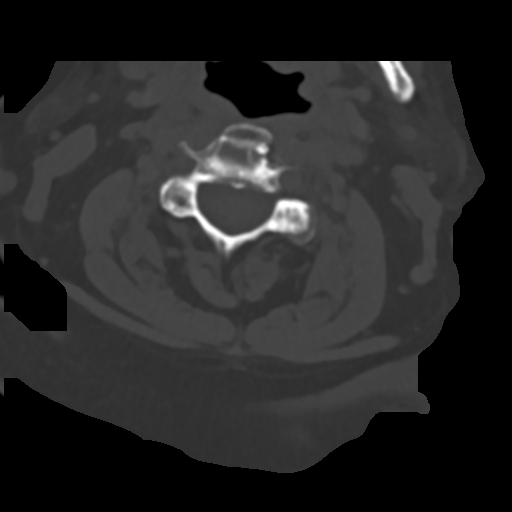
[im 75/88  bone]
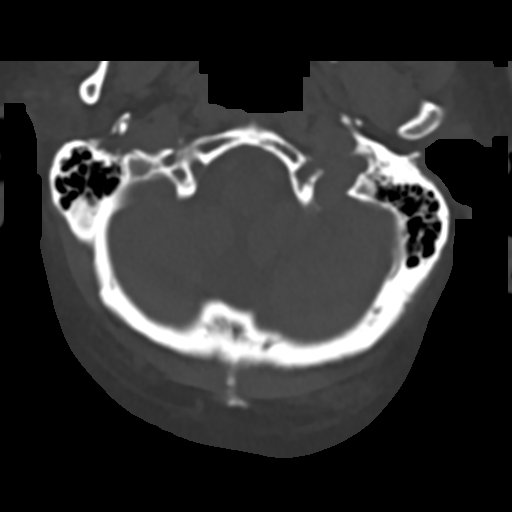

[Series 12: c_spine 2.0 sag bone · sagittal · 0.28mm/px · 5 of 61 slices shown, 6 images]
[im 21/61  bone]
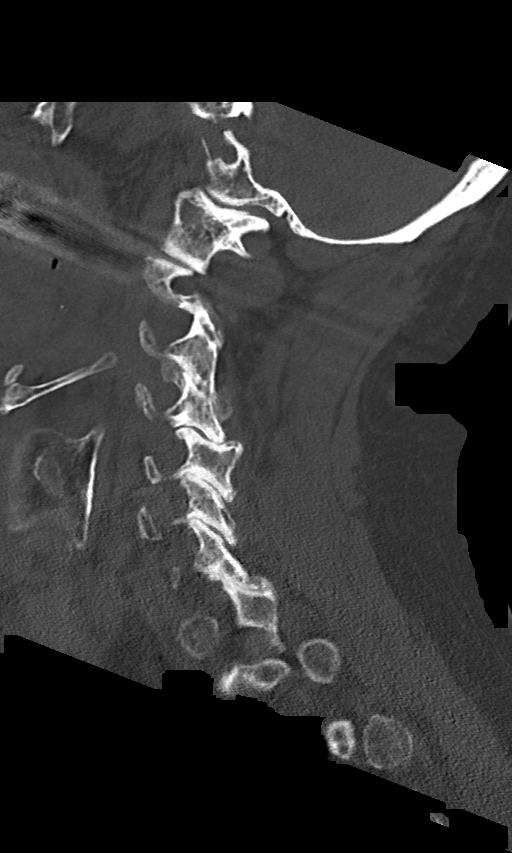
[im 26/61  bone]
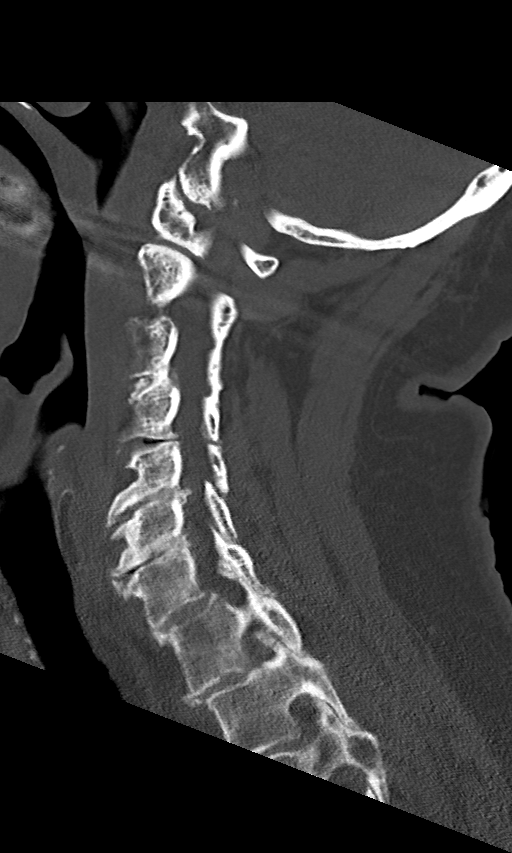
[im 31/61  soft-tissue]
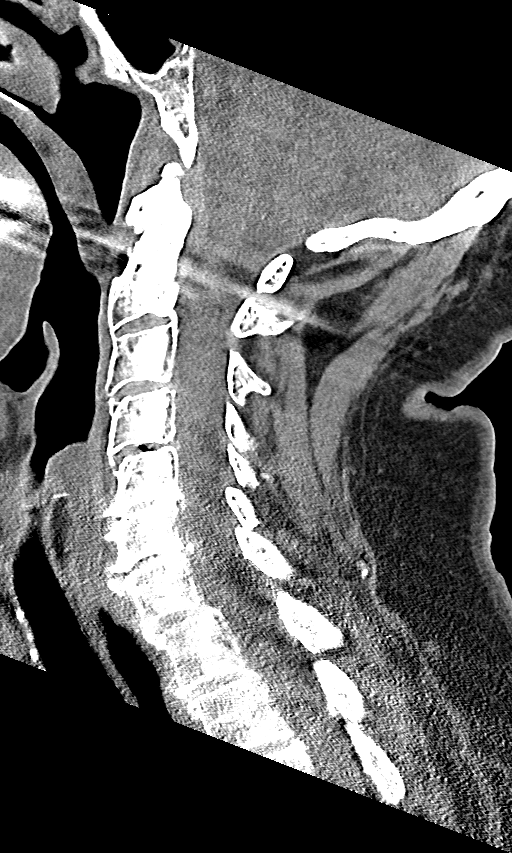
[im 31/61  bone]
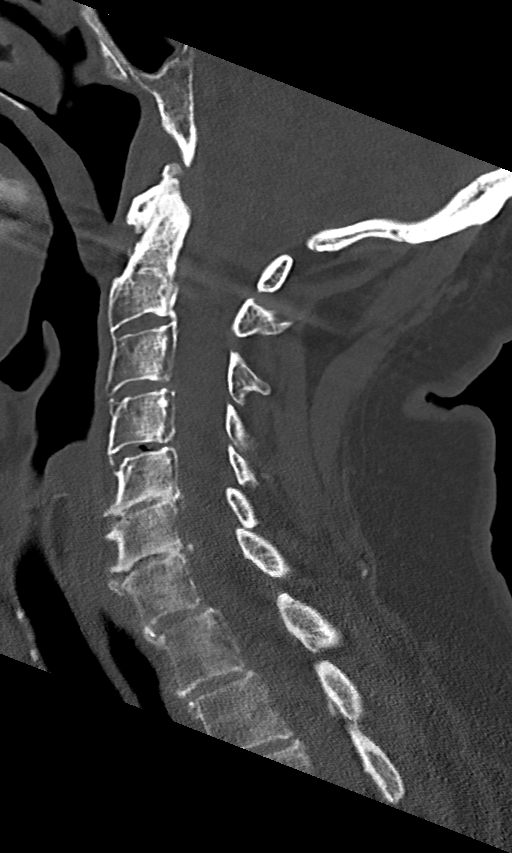
[im 36/61  bone]
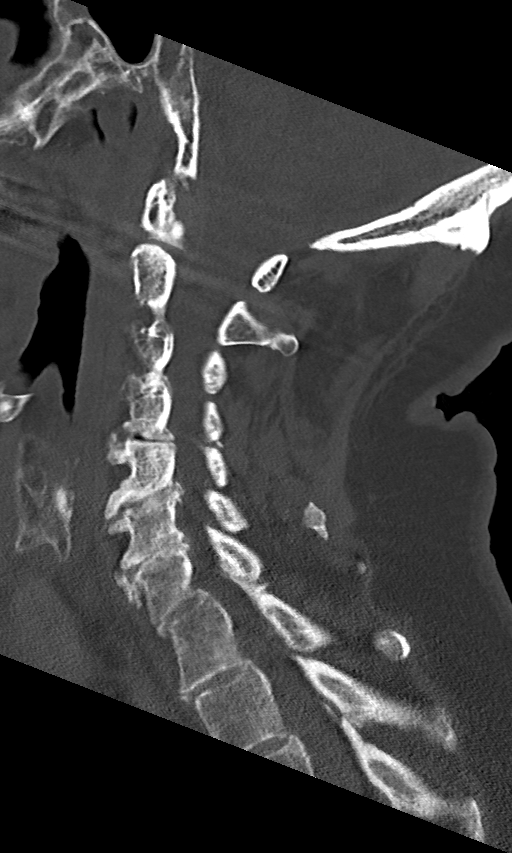
[im 41/61  bone]
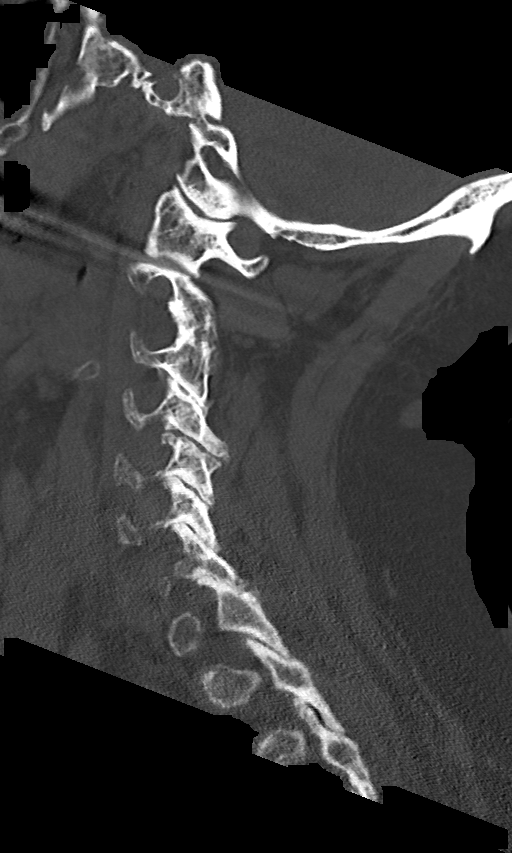

[Series 13: c_spine 2.0 cor bone · coronal · 0.29mm/px · 3 of 61 slices shown]
[im 14/61  bone]
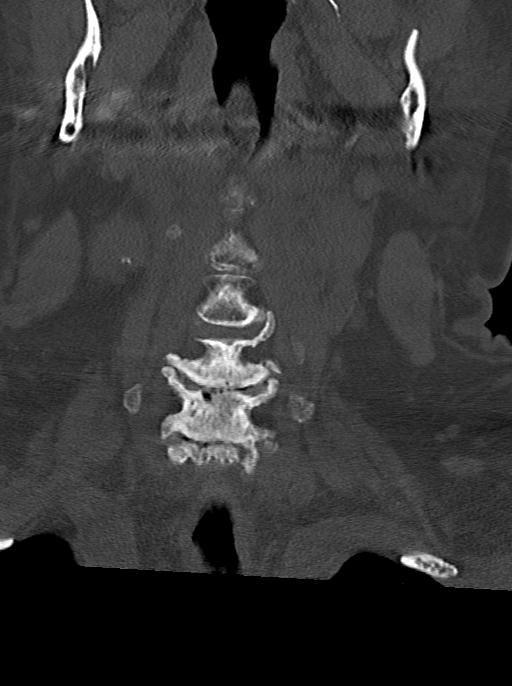
[im 25/61  bone]
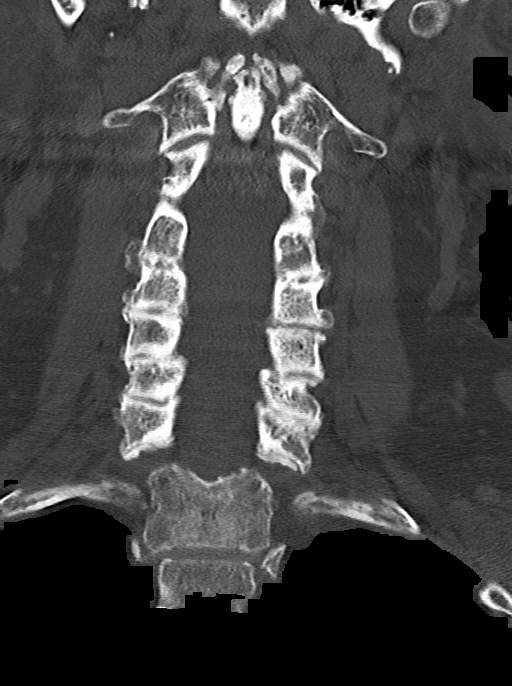
[im 36/61  bone]
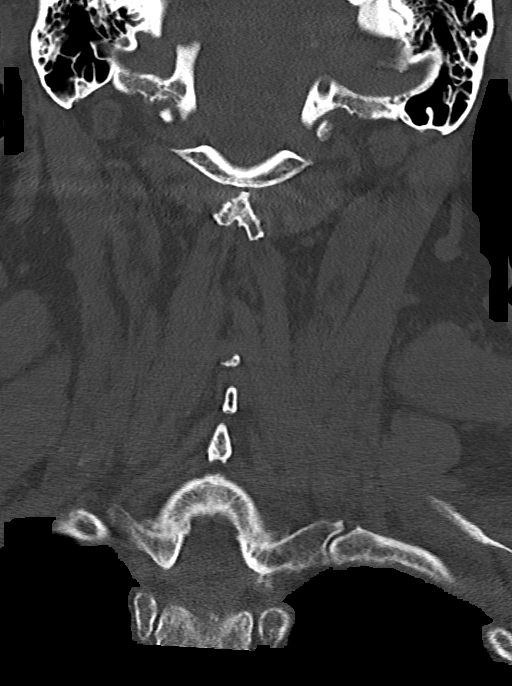

[12 of 33 positions shown; findings below may reference images not displayed]

FINDINGS: CT HEAD FINDINGS

Brain: No evidence of acute infarction, hemorrhage, hydrocephalus,
extra-axial collection or mass lesion/mass effect. There is marked
prominence of the CSF spaces overlying the cerebral hemispheres
which may reflect prominent brain atrophy and or chronic subdural
hygromas, image [DATE].

Vascular: No hyperdense vessel or unexpected calcification.

Skull: Choose 1

Sinuses/Orbits: Near complete opacification of the right maxillary
sinus and ethmoid air cells. Moderate mucosal thickening involves
the left maxillary sinus. Air-fluid levels noted within the sphenoid
sinus. Partial opacification of the frontal sinuses. Mastoid air
cells appear clear.

Other: None

CT CERVICAL SPINE FINDINGS

Alignment: Normal.

Skull base and vertebrae: No acute fracture. No primary bone lesion
or focal pathologic process.

Soft tissues and spinal canal: No prevertebral fluid or swelling. No
visible canal hematoma.

Disc levels: Multilevel disc space narrowing and endplate spurring
identified. This is most advanced at C5-6 and C6-7. Bilateral facet
arthropathy noted.

Upper chest: Negative.

Other: None
IMPRESSION: 1. No acute intracranial abnormalities.
2. Prominence of the CSF spaces overlying the cerebral hemispheres
may reflect prominent brain atrophy and/or chronic subdural
hygromas.
3. No evidence for cervical spine fracture.
4. Cervical spondylosis.

## 2020-12-30 IMAGING — US US RENAL
1 series · 14 of 25 positions shown · non-contrast
Comparison: None.

CLINICAL DATA: Acute kidney injury.

EXAM:
RENAL / URINARY TRACT ULTRASOUND COMPLETE

[Series 1: us renal · 14 of 29 slices shown]
[im 1/29]
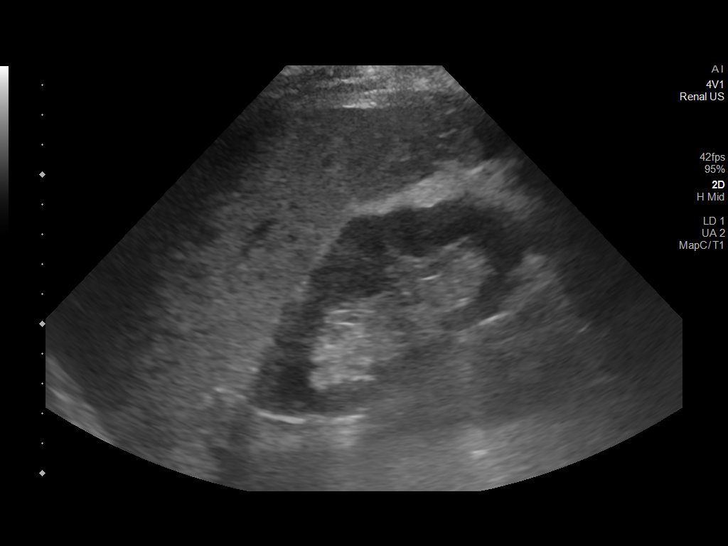
[im 3/29]
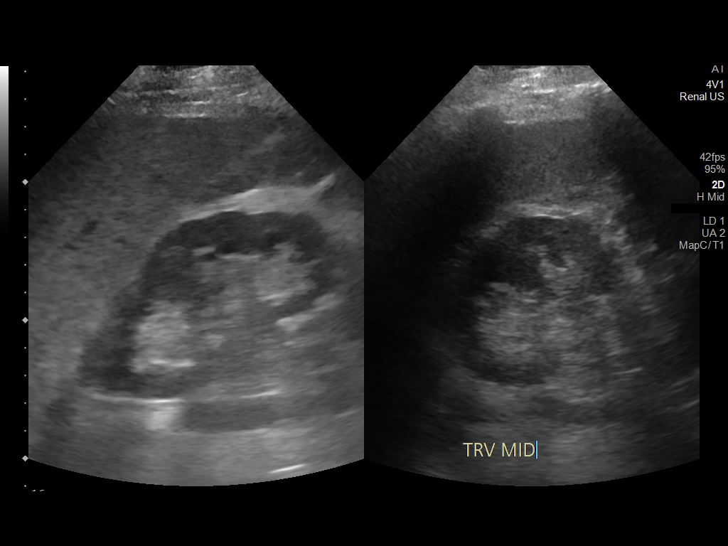
[im 5/29]
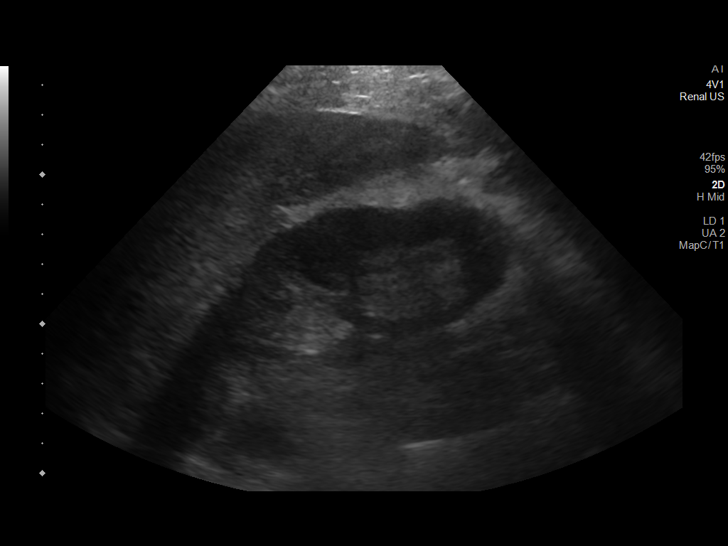
[im 8/29]
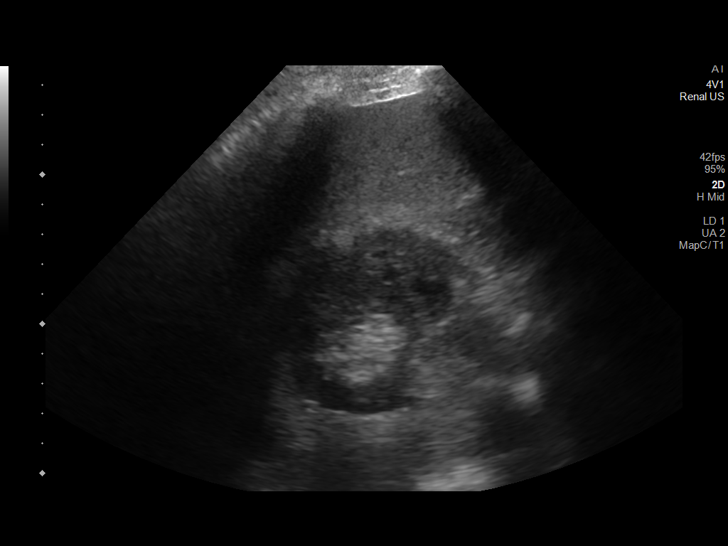
[im 10/29]
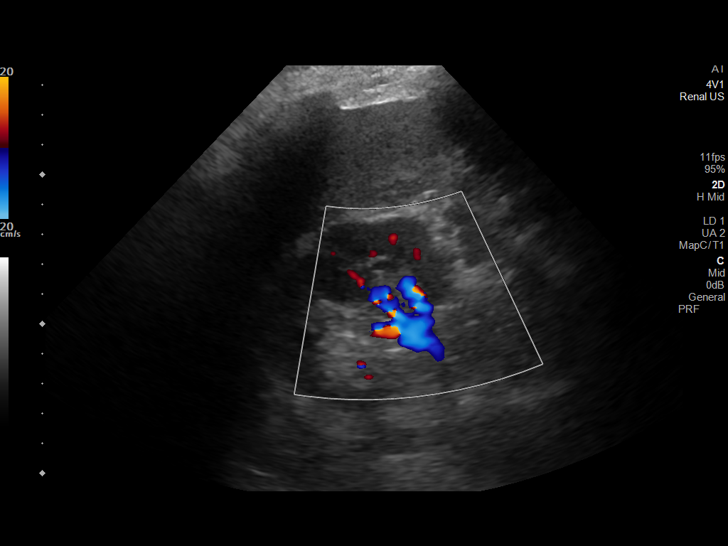
[im 11/29]
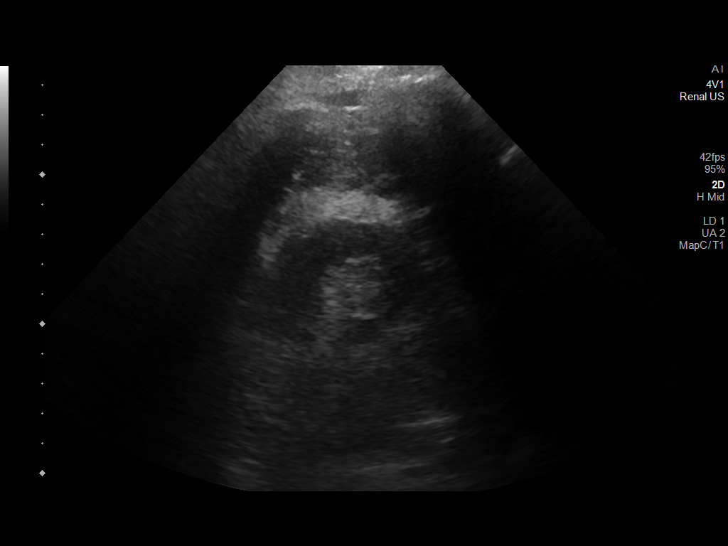
[im 13/29]
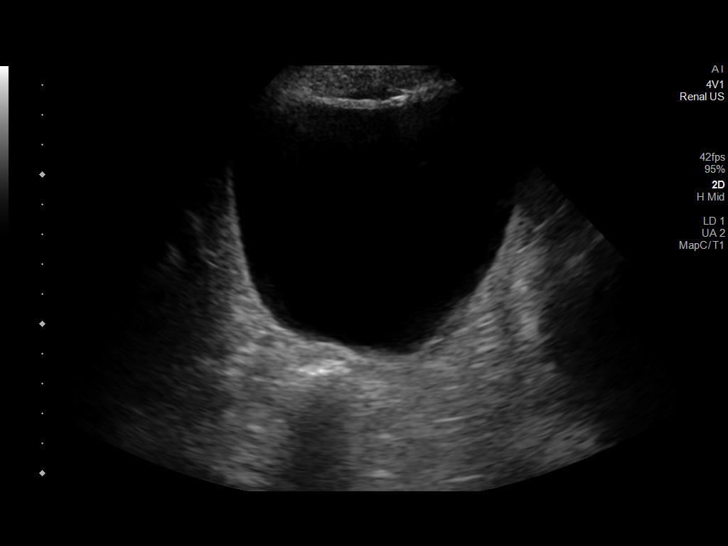
[im 16/29]
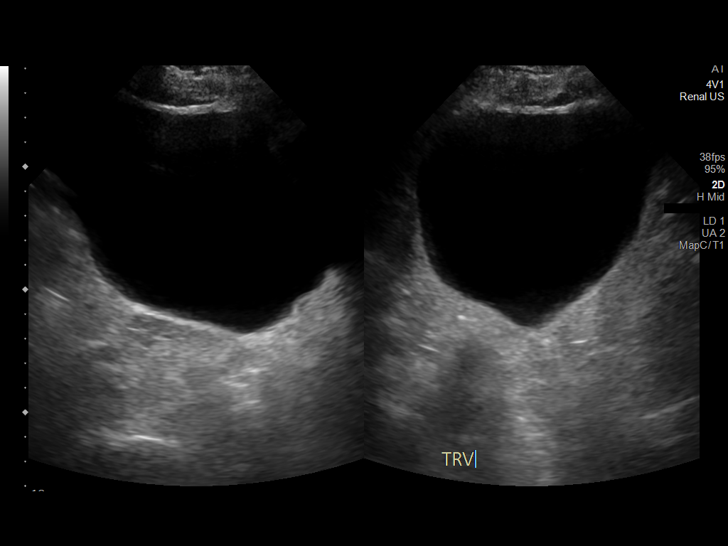
[im 18/29]
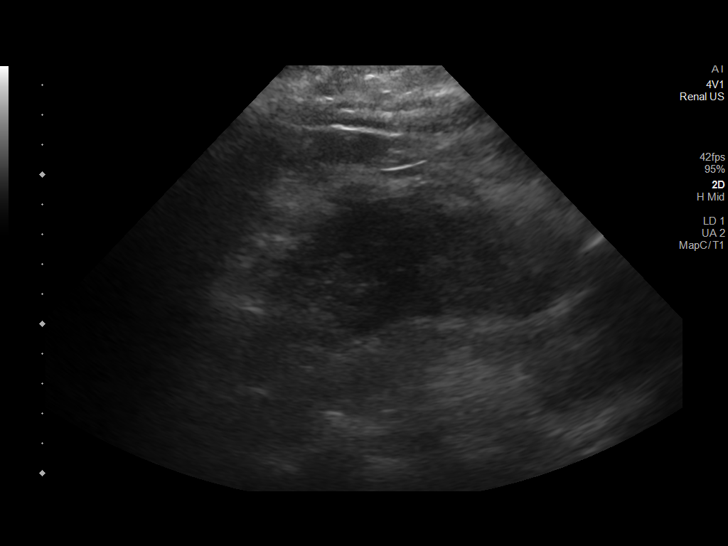
[im 19/29]
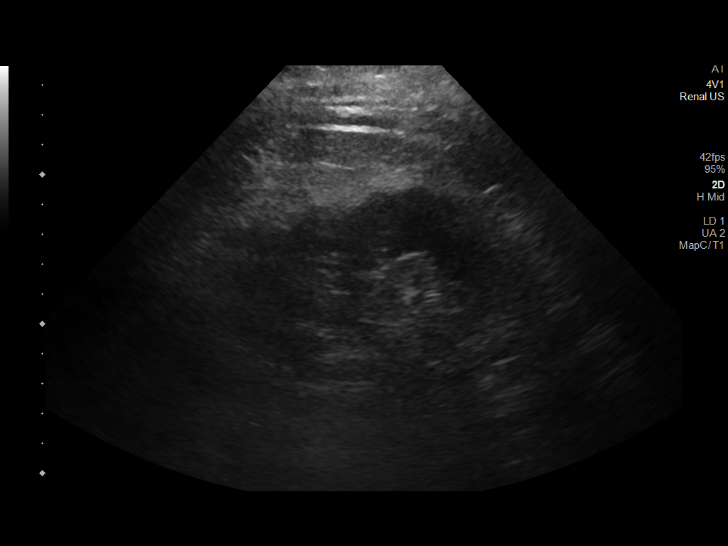
[im 22/29]
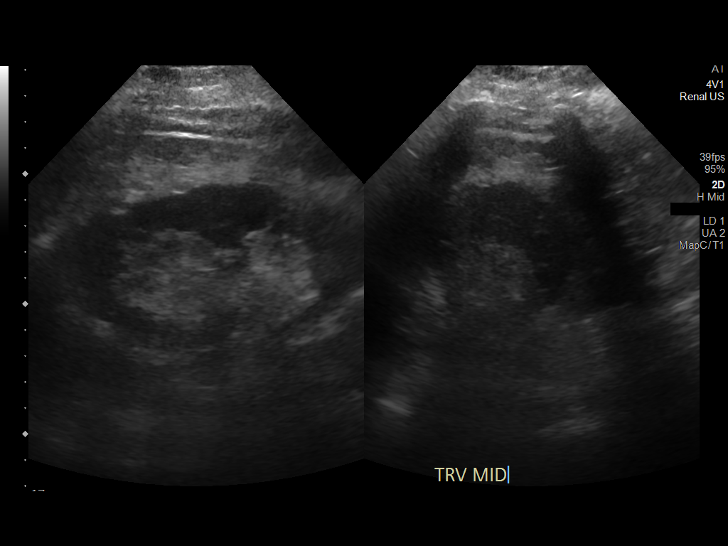
[im 24/29]
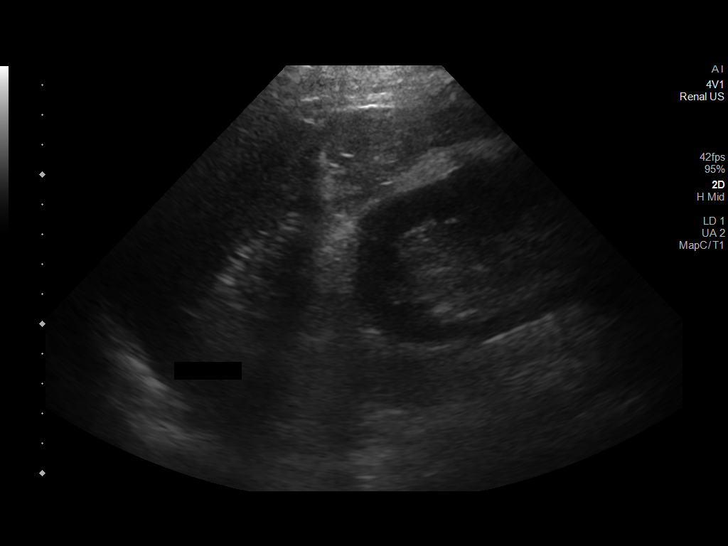
[im 26/29]
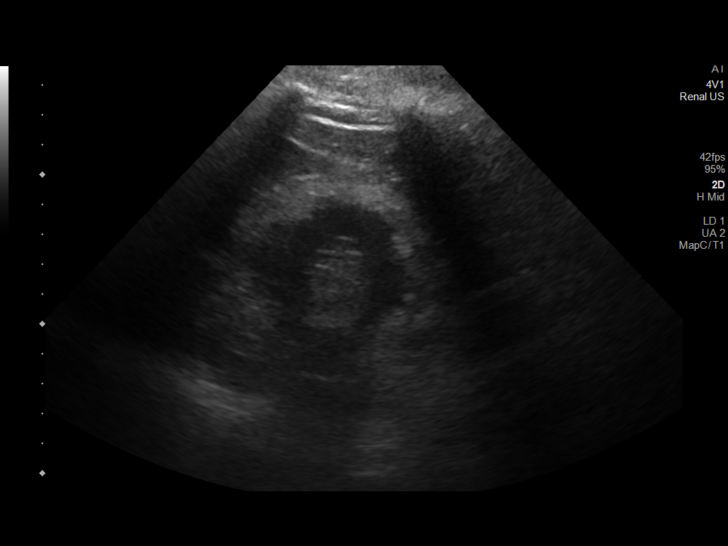
[im 29/29]
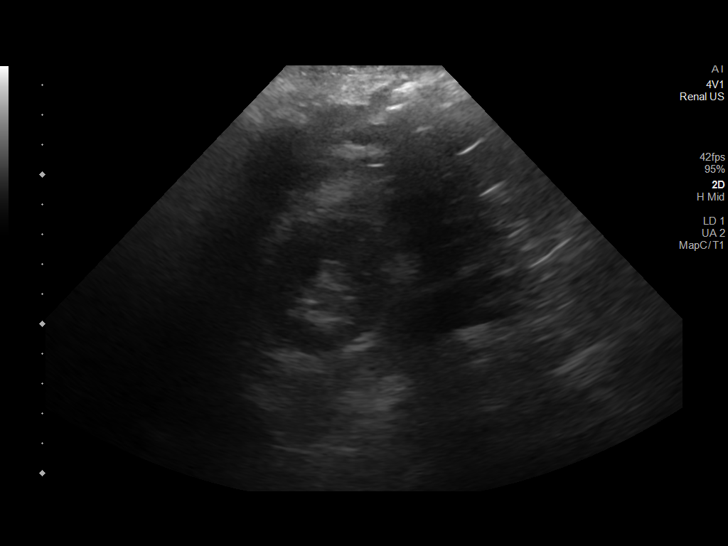

[14 of 25 positions shown; findings below may reference images not displayed]

FINDINGS: Right Kidney:

Renal measurements: 10.2 x 5.5 x 5.1 cm = volume: 149 mL.
Echogenicity within normal limits. No mass or hydronephrosis
visualized.

Left Kidney:

Renal measurements: 11.6 x 6.2 x 5.3 cm = volume: 199 mL.
Echogenicity within normal limits. No mass or hydronephrosis
visualized.

Bladder:

Appears normal for degree of bladder distention.

Other:

None.
IMPRESSION: Normal renal ultrasound.

## 2020-12-30 IMAGING — CR DG FINGER MIDDLE 2+V*R*
3 series · 3 of 3 positions shown · non-contrast
Comparison: None.

CLINICAL DATA: Right middle finger swelling after fall.

EXAM:
RIGHT MIDDLE FINGER 2+V

[finger ap]
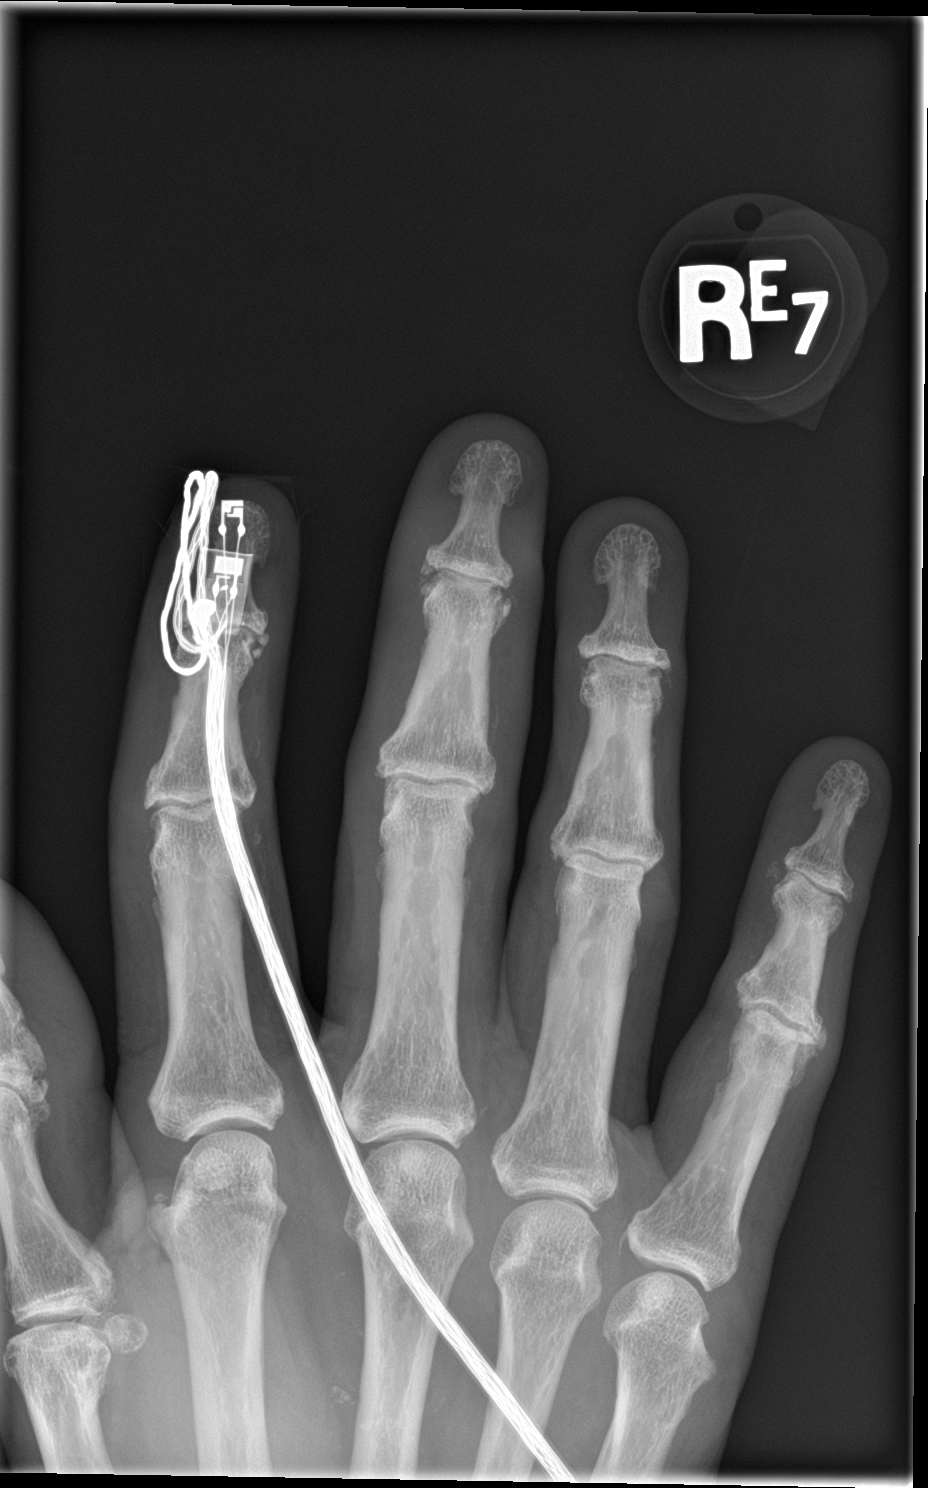

[finger obl]
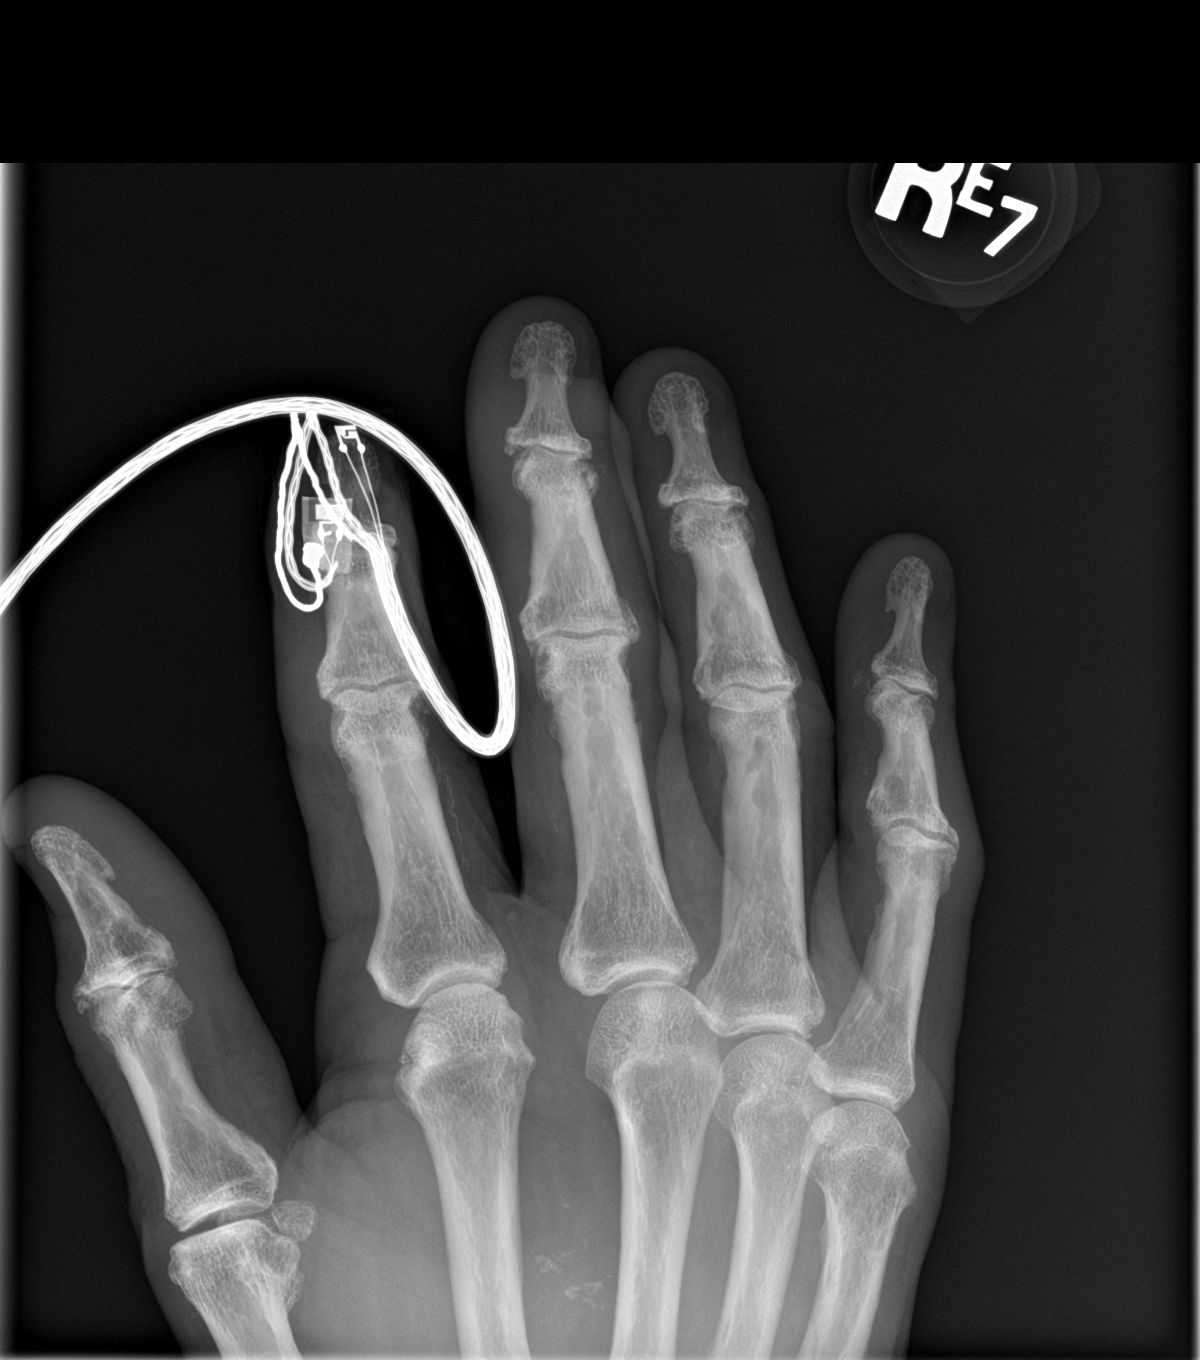

[finger lat]
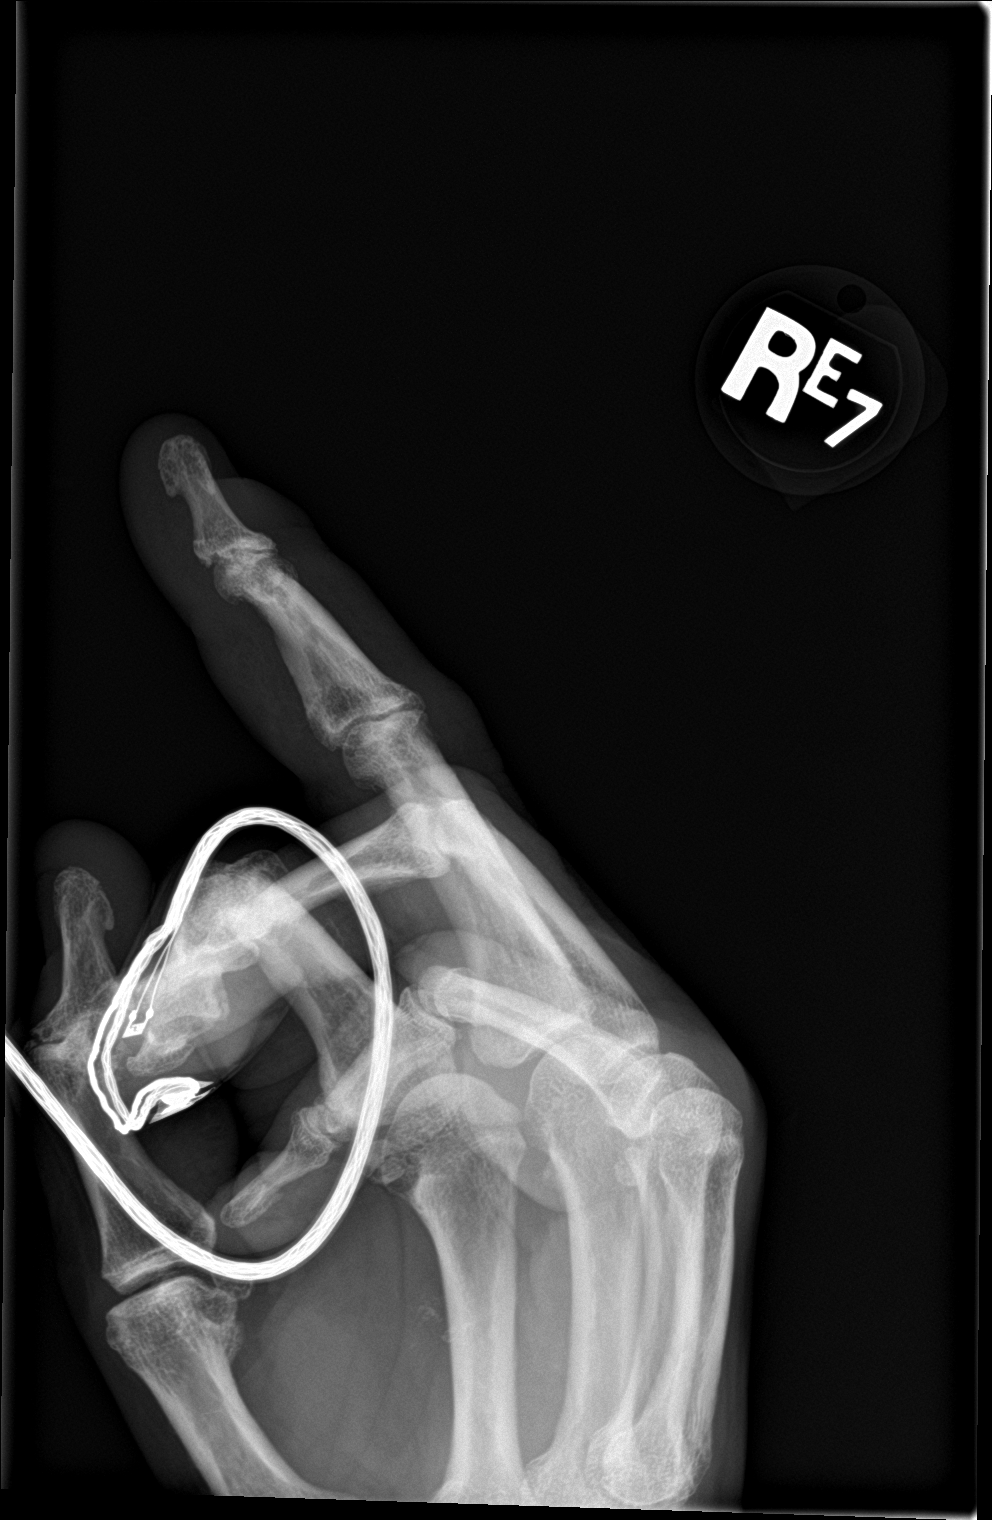

[3 of 3 positions shown; findings below may reference images not displayed]

FINDINGS: There is no evidence of fracture or dislocation. Mild degenerative
changes seen involving the third distal interphalangeal joint. Mild
diffuse soft tissue swelling is noted.
IMPRESSION: No fracture or dislocation is noted. Probable osteoarthritis
involving the third distal interphalangeal joint. Mild soft tissue
swelling is noted which may be posttraumatic in etiology.

## 2020-12-30 IMAGING — MR MR CERVICAL SPINE WO/W CM
6 of 8 series · 29 of 48 positions shown · IV contrast (gadavist)
Comparison: CT cervical spine dated [DATE].

CLINICAL DATA: Lower extremity weakness for 1 week. History of
prostate cancer.

EXAM:
MRI CERVICAL, THORACIC AND LUMBAR SPINE WITHOUT AND WITH CONTRAST
TECHNIQUE: Multiplanar and multiecho pulse sequences of the cervical spine, to
include the craniocervical junction and cervicothoracic junction,
and thoracic and lumbar spine, were obtained without and with
intravenous contrast.
CONTRAST:  9mL GADAVIST GADOBUTROL 1 MMOL/ML IV SOLN

[Series 1: T2 · sagittal · 3.0mm · 0.69mm/px · 4 of 15 slices shown (1 of 2)]
[im 1/15]
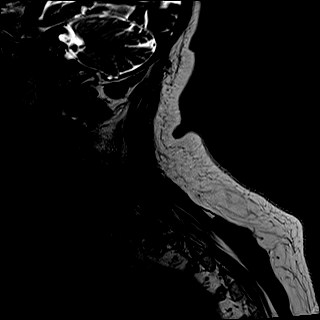
[im 5/15]
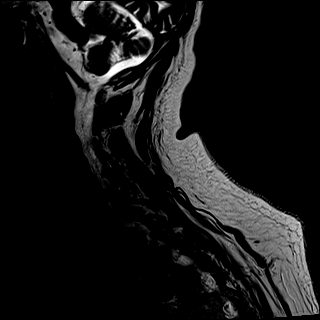
[im 10/15]
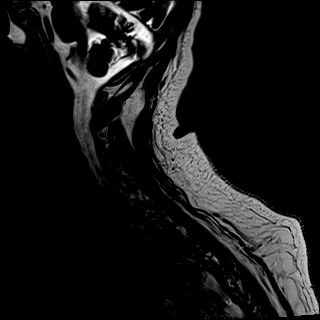
[im 15/15]
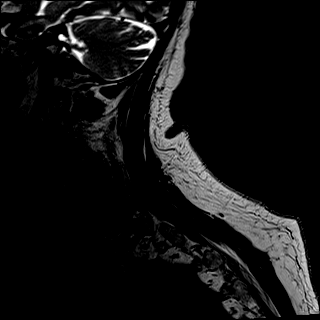

[Series 2: T1 · sagittal · 3.0mm · 0.69mm/px · 4 of 15 slices shown (1 of 2)]
[im 1/15]
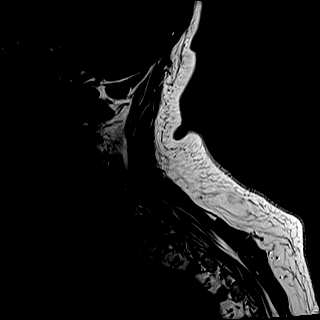
[im 5/15]
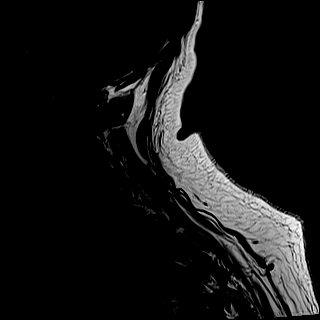
[im 10/15]
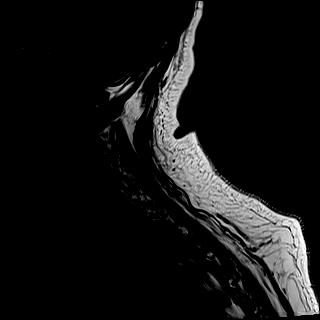
[im 15/15]
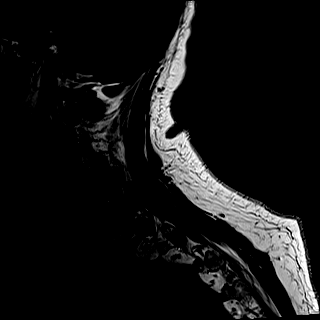

[Series 3: STIR · sagittal · 3.0mm · 0.86mm/px · 4 of 15 slices shown]
[im 1/15]
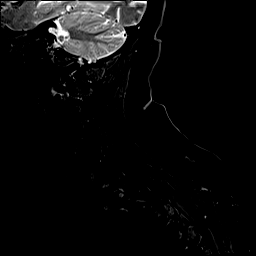
[im 5/15]
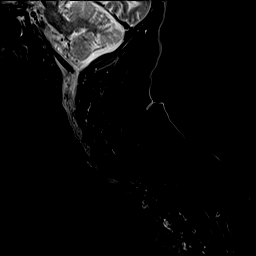
[im 10/15]
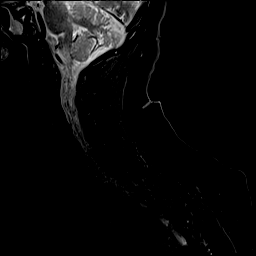
[im 15/15]
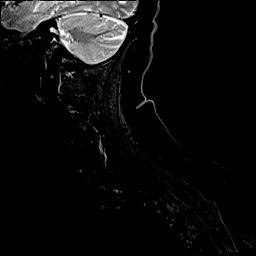

[Series 4: T2 · axial · 3.0mm · 0.66mm/px · z∈[-152,-58]mm · 8 of 34 slices shown (2 of 2)]
[im 1/34]
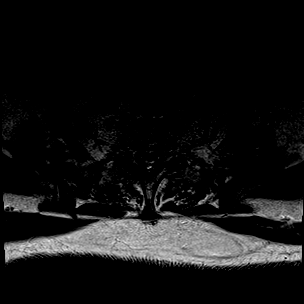
[im 5/34]
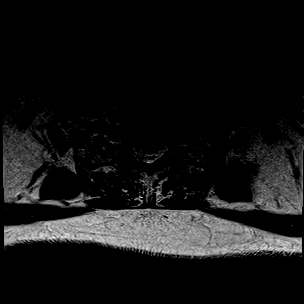
[im 10/34]
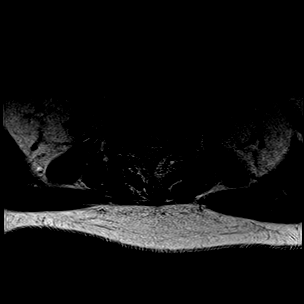
[im 15/34]
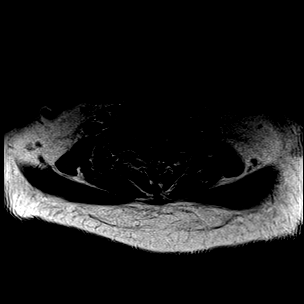
[im 19/34]
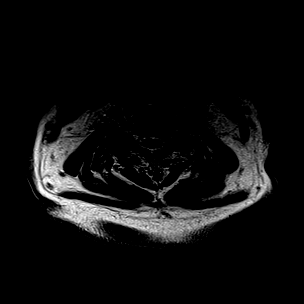
[im 24/34]
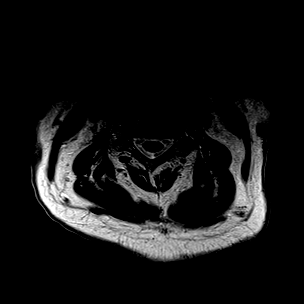
[im 29/34]
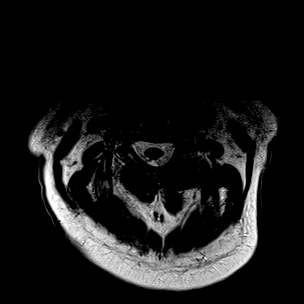
[im 34/34]
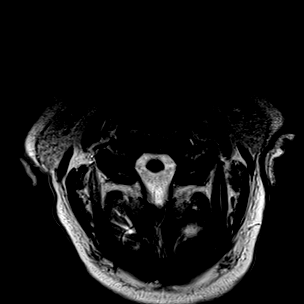

[Series 6: T1 · axial · 3.0mm · 0.35mm/px · z∈[-147,-53]mm · 8 of 34 slices shown (2 of 2)]
[im 1/34]
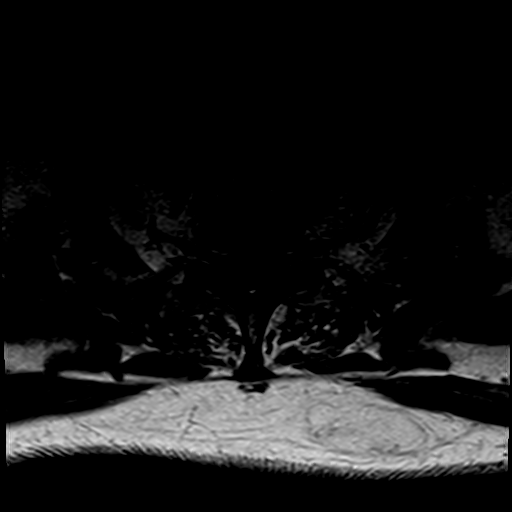
[im 5/34]
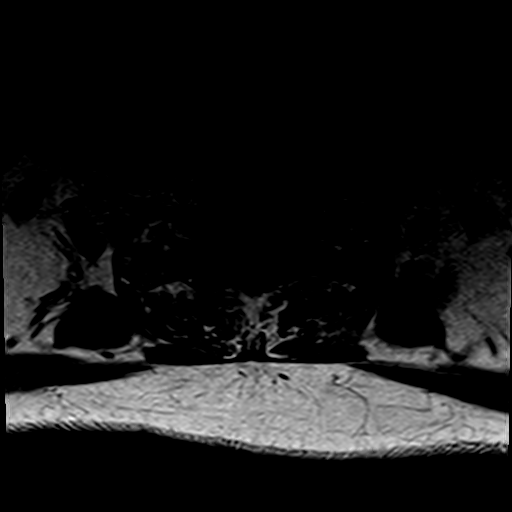
[im 10/34]
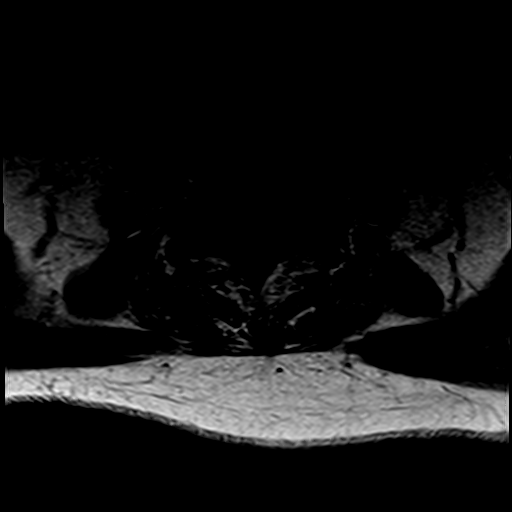
[im 15/34]
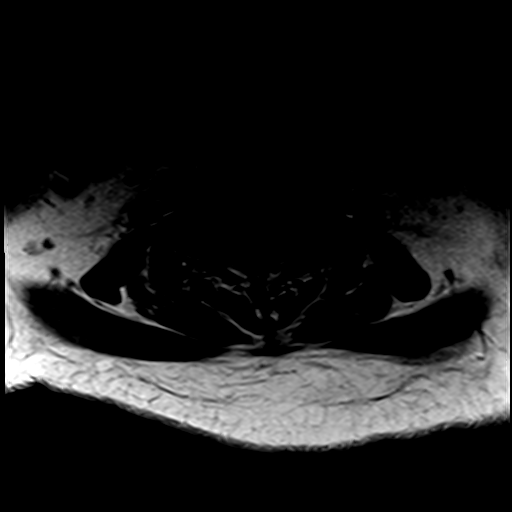
[im 19/34]
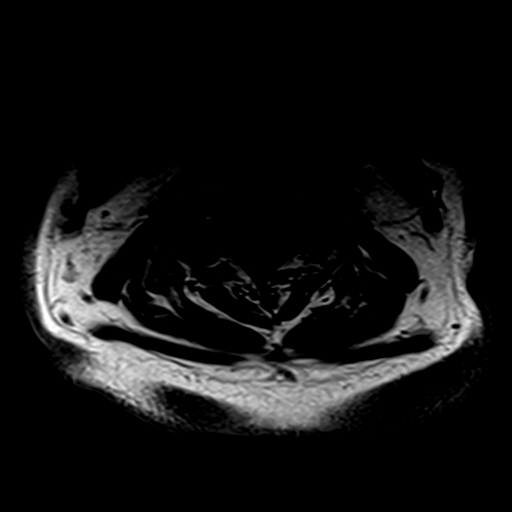
[im 24/34]
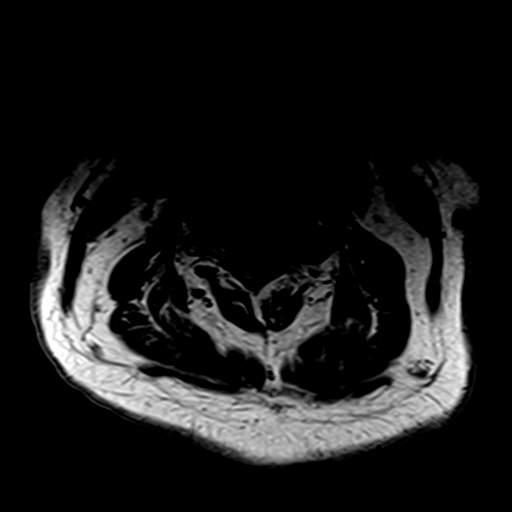
[im 29/34]
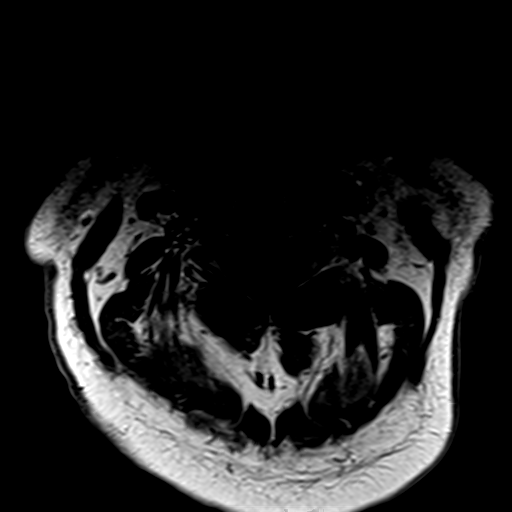
[im 34/34]
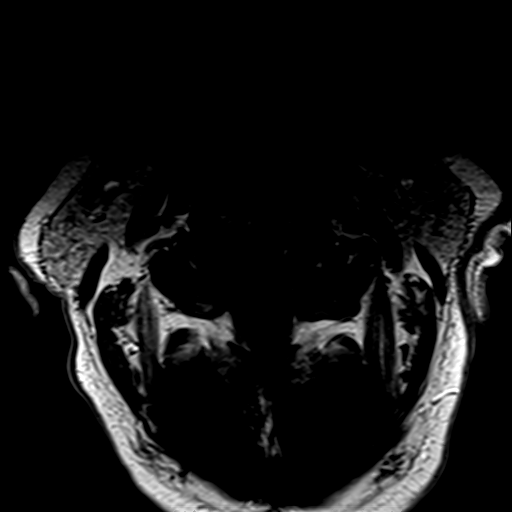

[Series 7: T1 post-contrast · sagittal · 3.0mm · 0.43mm/px · 1 of 15 slices shown]
[im 1/15]
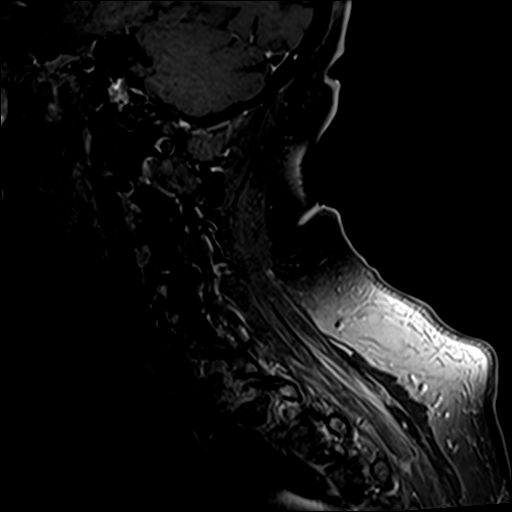

[29 of 48 positions shown; findings below may reference images not displayed]

FINDINGS: MRI CERVICAL SPINE FINDINGS

Alignment: 3 mm anterolisthesis at C7-T1.

Vertebrae: No fracture, evidence of discitis, or bone lesion.

Cord: Normal signal and morphology.  No intradural enhancement.

Posterior Fossa, vertebral arteries, paraspinal tissues: Negative.

Disc levels:

C2-C3: Partial interbody and posterior element ankylosis. No
stenosis.

C3-C4: No significant disc bulge or herniation. Posterior element
ankylosis and spurring. Mild bilateral neuroforaminal stenosis. No
spinal canal stenosis.

C4-C5: No significant disc bulge or herniation. Moderate left
greater than right facet uncovertebral hypertrophy. Moderate left
and mild right neuroforaminal stenosis. No spinal canal stenosis.

C5-C6: Circumferential disc osteophyte complex. Severe bilateral
uncovertebral hypertrophy. Mild bilateral facet arthropathy. Mild
spinal canal stenosis. Severe bilateral neuroforaminal stenosis.

C6-C7: Circumferential disc osteophyte complex. Severe bilateral
uncovertebral hypertrophy. Mild bilateral facet arthropathy. Mild
spinal canal stenosis. Severe left and mild-to-moderate right
neuroforaminal stenosis.

C7-T1: Negative disc. Severe bilateral facet arthropathy. No
stenosis.

MRI THORACIC SPINE FINDINGS

Alignment:  Trace anterolisthesis at T1-T2, T2-T3, and T4-T5.

Vertebrae: No fracture, evidence of discitis, or bone lesion.

Cord:  Normal signal and morphology.  No intradural enhancement.

Paraspinal and other soft tissues: Trace bilateral pleural
effusions.

Disc levels:

Scattered small disc protrusions. No spinal canal or neuroforaminal
stenosis at any level.

MRI LUMBAR SPINE FINDINGS

Segmentation: Transitional lumbosacral anatomy with partial
lumbarization of S1.

Alignment: Trace retrolisthesis at L2-L3 and L5-S1. 4 mm
anterolisthesis at L4-L5.

Vertebrae: No fracture, evidence of discitis, or bone lesion.
Asymmetric right-sided degenerative endplate marrow edema at L2-L3.

Conus medullaris and cauda equina: Conus extends to the L1-L2 level.
Conus and cauda equina appear normal. No intradural enhancement.

Paraspinal and other soft tissues: Negative.

Disc levels:

T12-L1:  Negative.

L1-L2: Small circumferential disc osteophyte complex and moderate
bilateral facet arthropathy. No stenosis.

L2-L3: Moderate circumferential disc osteophyte complex. Moderate
bilateral facet arthropathy with ligamentum flavum hypertrophy.
Severe spinal canal stenosis. Moderate left and mild right
neuroforaminal stenosis.

L3-L4: Mild disc bulging and endplate spurring asymmetric to the
left. Moderate bilateral facet arthropathy with ligamentum flavum
hypertrophy. Mild spinal canal and bilateral neuroforaminal
stenosis.

L4-L5: Disc uncovering and mild disc bulging. Severe bilateral facet
arthropathy with ligamentum flavum hypertrophy. Severe spinal canal
stenosis. Mild bilateral neuroforaminal stenosis.

L5-S1: Small circumferential disc osteophyte complex eccentric to
the right. Moderate bilateral facet arthropathy. Mild right
neuroforaminal stenosis. No spinal canal or left neuroforaminal
stenosis.

S1-S2: Rudimentary disc.  No stenosis.
IMPRESSION: 1. No evidence of metastatic disease.  No acute abnormality.
2. Multilevel cervical spondylosis as described above. Severe
bilateral neuroforaminal stenosis at C5-C6 and left neuroforaminal
stenosis at C6-C7.
3. Severe spinal canal stenosis at L2-L3 and L4-L5.
4. Transitional lumbosacral anatomy. Correlation with radiographs is
recommended prior to any operative intervention.
5. Trace bilateral pleural effusions.

## 2020-12-30 IMAGING — DX DG RIBS W/ CHEST 3+V*R*
6 series · 6 of 6 positions shown · non-contrast
Comparison: Chest radiograph-[DATE]

CLINICAL DATA: Post fall earlier today now with right-sided rib
pain.

EXAM:
RIGHT RIBS AND CHEST - 3+ VIEW

[chest ap (1 of 2)]
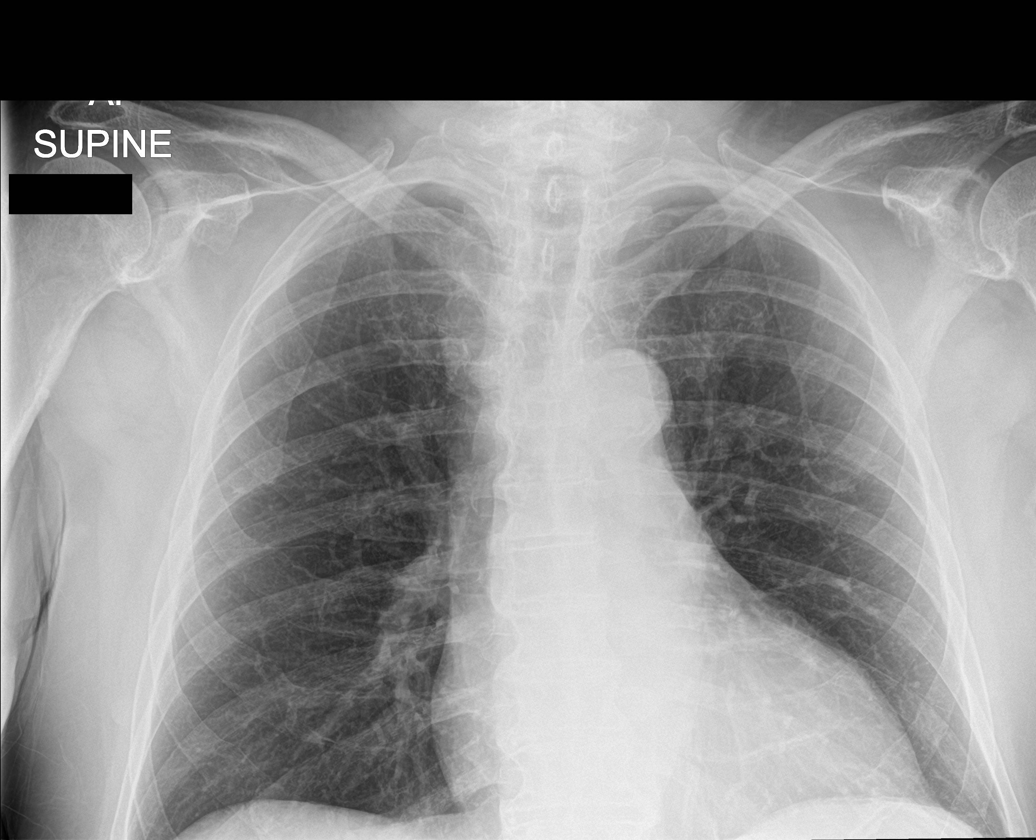

[rib ap (1 of 2)]
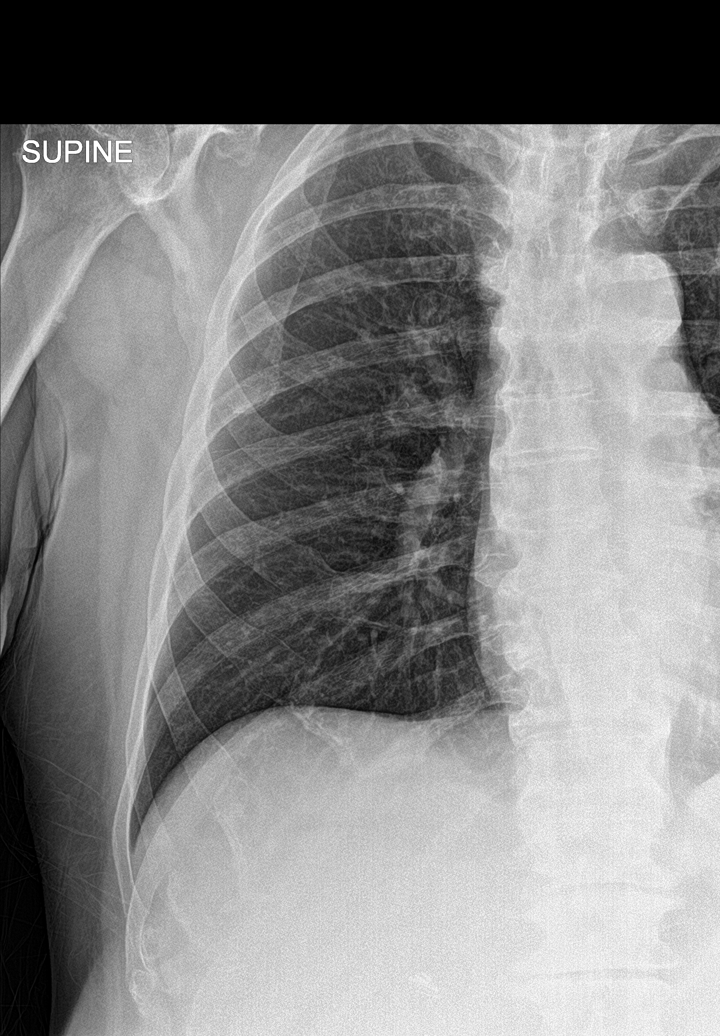

[chest ap (2 of 2)]
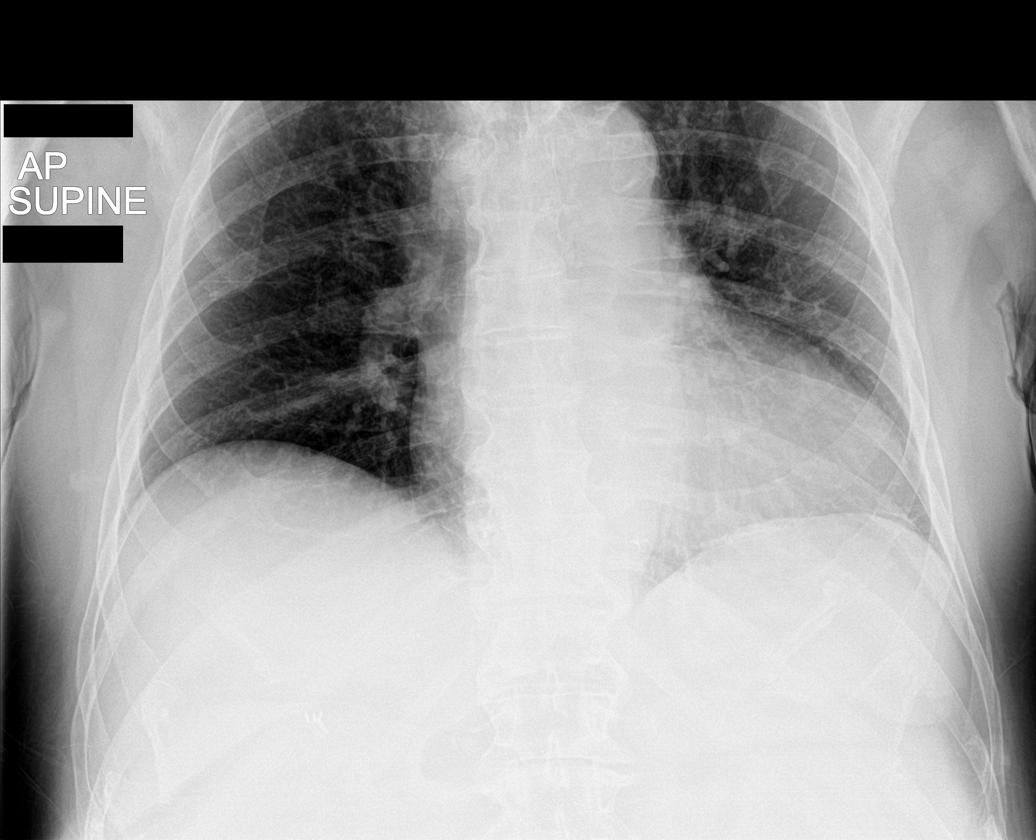

[rib ap (2 of 2)]
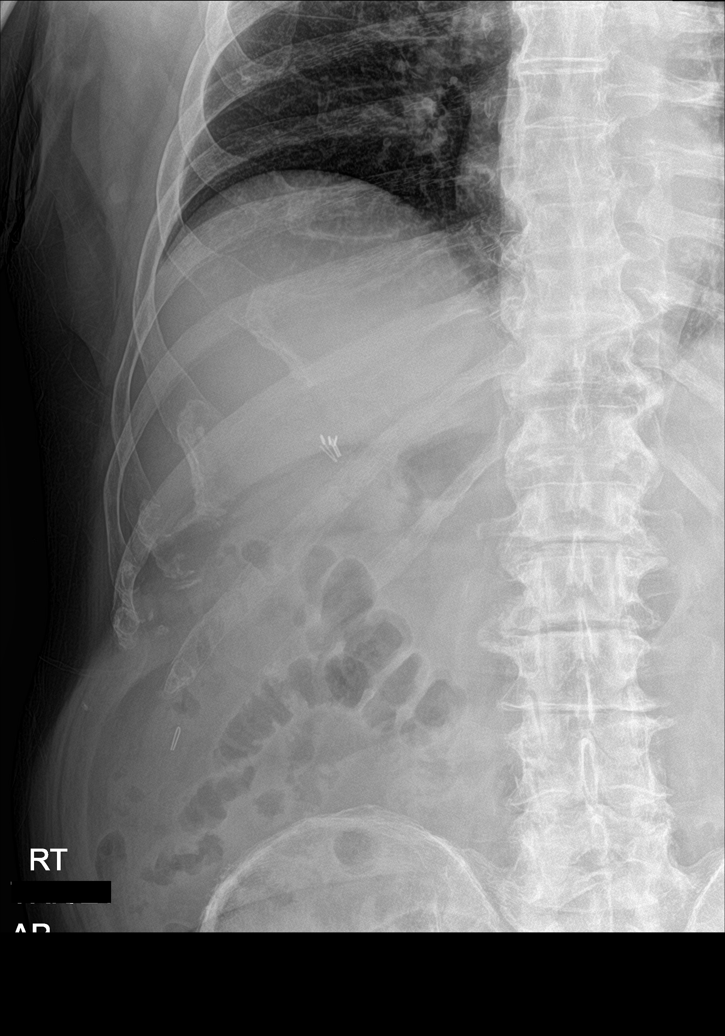

[rib ap obl (1 of 2)]
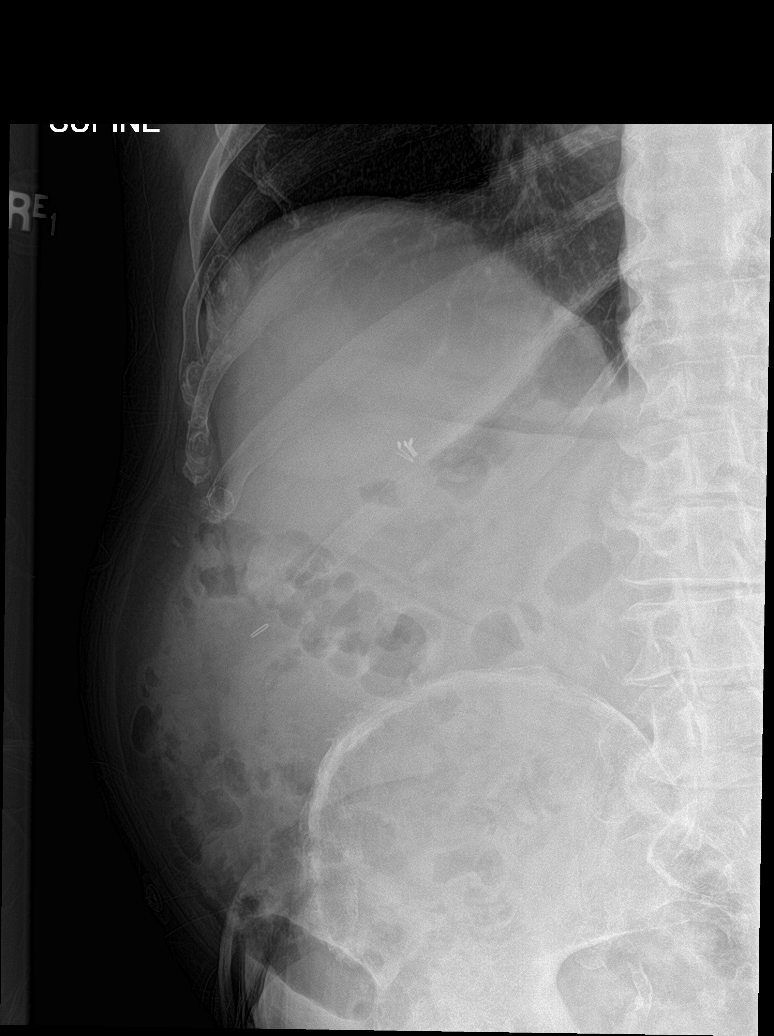

[rib ap obl (2 of 2)]
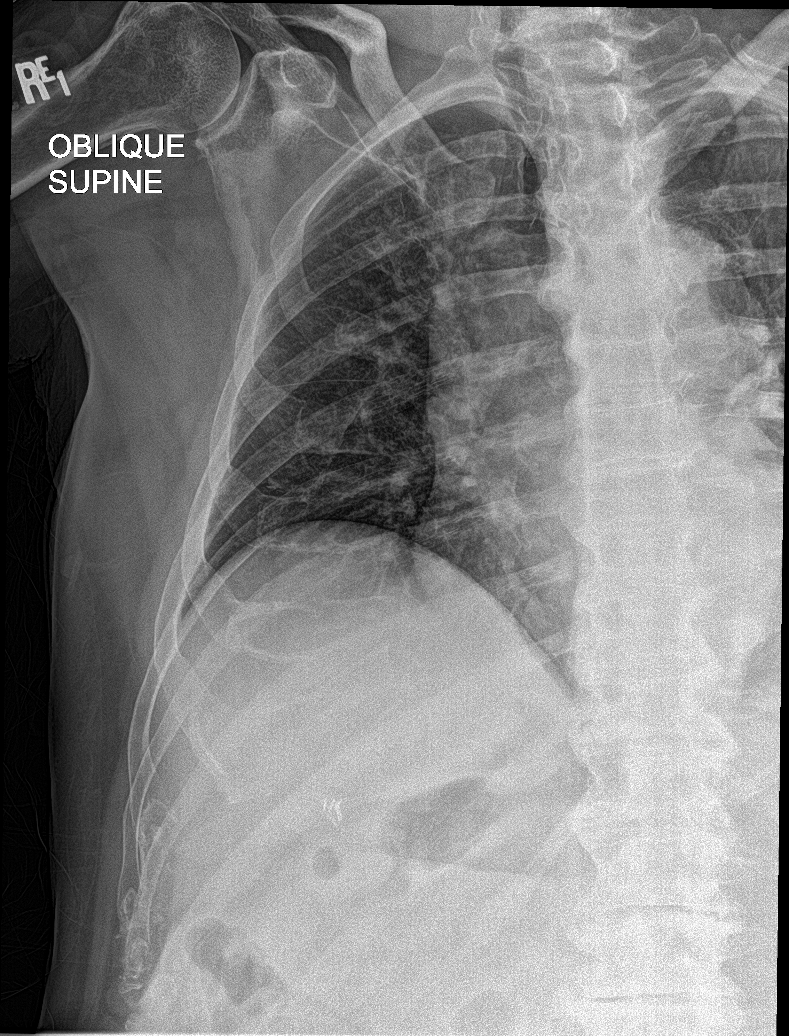

[6 of 6 positions shown; findings below may reference images not displayed]

FINDINGS: Grossly unchanged borderline enlarged cardiac silhouette and
mediastinal contours with atherosclerotic plaque within the thoracic
aorta.

No focal airspace opacities. No pleural effusion or pneumothorax. No
evidence of edema.

No definite displaced right-sided rib fractures. Regional soft
tissues appear normal. No radiopaque foreign body. Stigmata of dish
within the thoracic spine. Degenerative change of the lumbar spine
is suspected though incompletely evaluated.

Post cholecystectomy. Additional surgical clip overlies the lateral
aspect of the right mid abdomen, potentially a displaced
cholecystectomy clip.
IMPRESSION: 1. Borderline cardiomegaly without superimposed acute
cardiopulmonary disease.
2. No definite displaced right-sided rib fractures.

## 2020-12-30 IMAGING — MR MR LUMBAR SPINE WO/W CM
4 of 7 series · 24 of 48 positions shown · IV contrast (gadavist)
Comparison: CT cervical spine dated [DATE].

CLINICAL DATA: Lower extremity weakness for 1 week. History of
prostate cancer.

EXAM:
MRI CERVICAL, THORACIC AND LUMBAR SPINE WITHOUT AND WITH CONTRAST
TECHNIQUE: Multiplanar and multiecho pulse sequences of the cervical spine, to
include the craniocervical junction and cervicothoracic junction,
and thoracic and lumbar spine, were obtained without and with
intravenous contrast.
CONTRAST:  9mL GADAVIST GADOBUTROL 1 MMOL/ML IV SOLN

[Series 1: T2 · sagittal · 4.0mm · 0.73mm/px · 6 of 16 slices shown (1 of 2)]
[im 1/16]
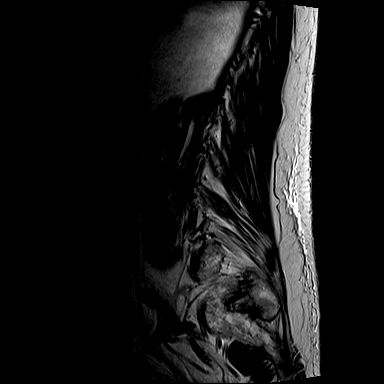
[im 4/16]
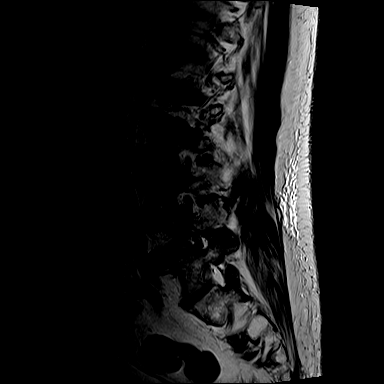
[im 7/16]
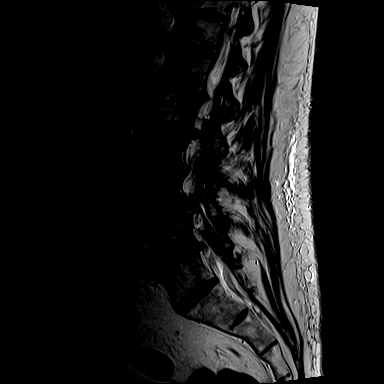
[im 10/16]
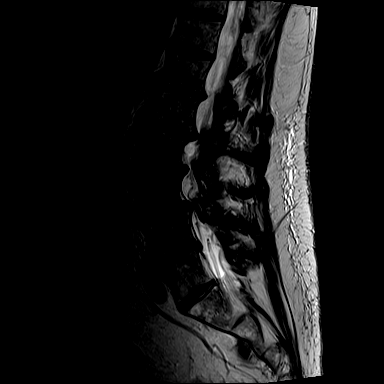
[im 13/16]
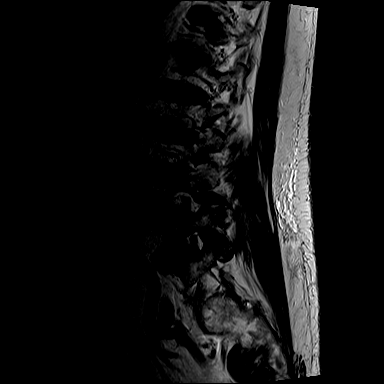
[im 16/16]
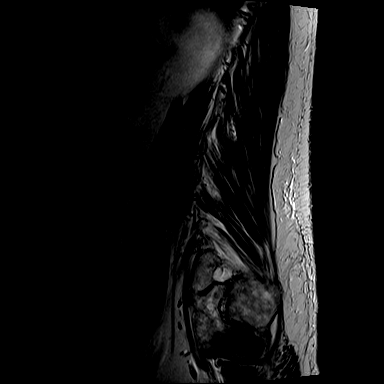

[Series 3: T1 · sagittal · 4.0mm · 0.88mm/px · 5 of 16 slices shown (1 of 2)]
[im 1/16]
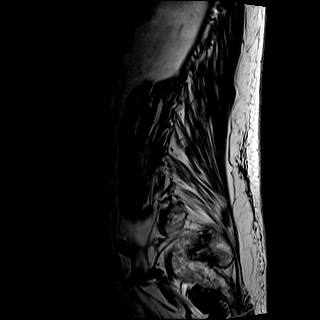
[im 4/16]
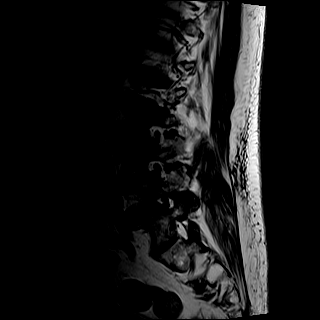
[im 8/16]
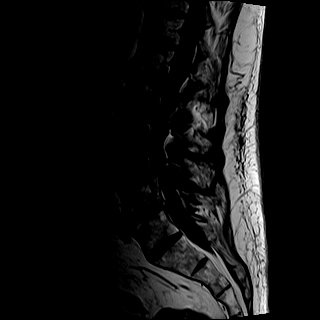
[im 12/16]
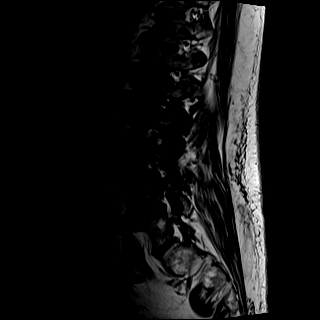
[im 16/16]
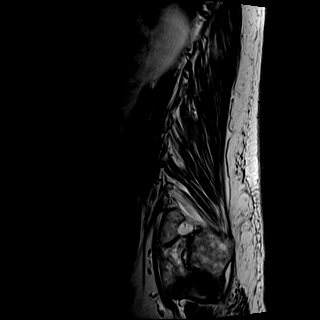

[Series 4: T2 · axial · 5.0mm · 0.57mm/px · z∈[-588,-358]mm · 9 of 27 slices shown (2 of 2)]
[im 1/27]
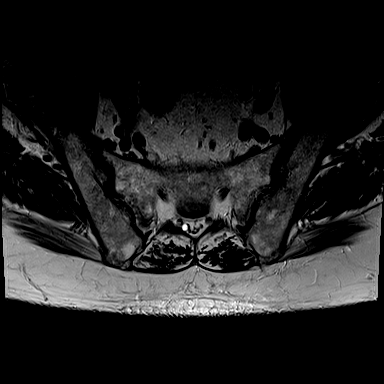
[im 4/27]
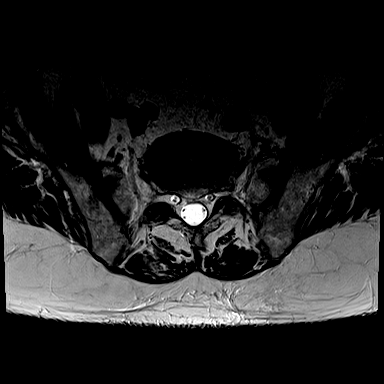
[im 7/27]
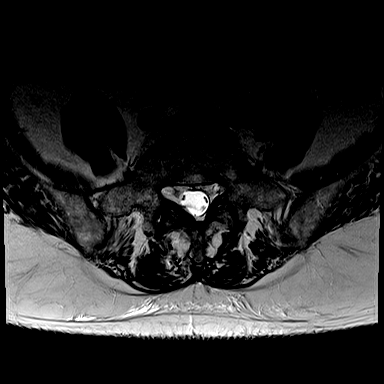
[im 10/27]
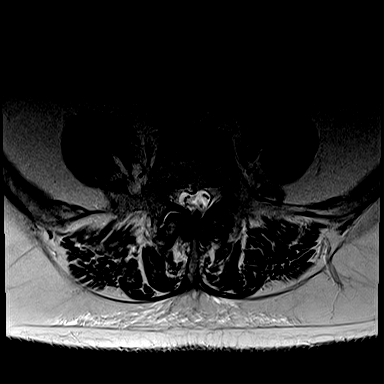
[im 14/27]
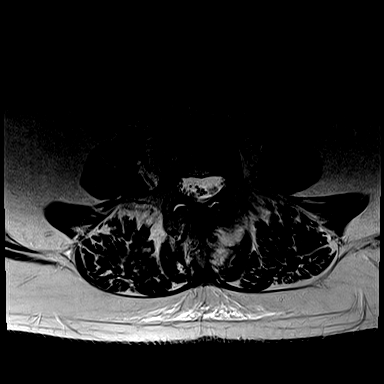
[im 17/27]
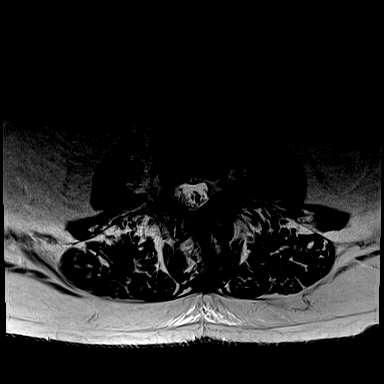
[im 20/27]
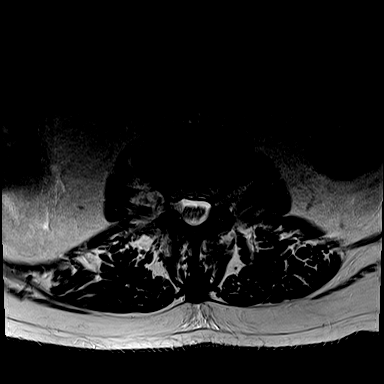
[im 23/27]
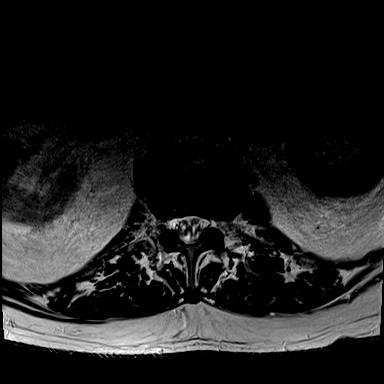
[im 27/27]
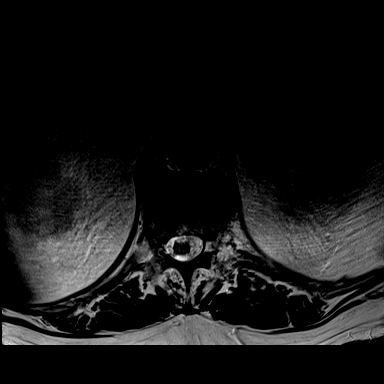

[Series 5: T1 · axial · 5.0mm · 0.34mm/px · z∈[-588,-388]mm · 4 of 27 slices shown (2 of 2)]
[im 1/27]
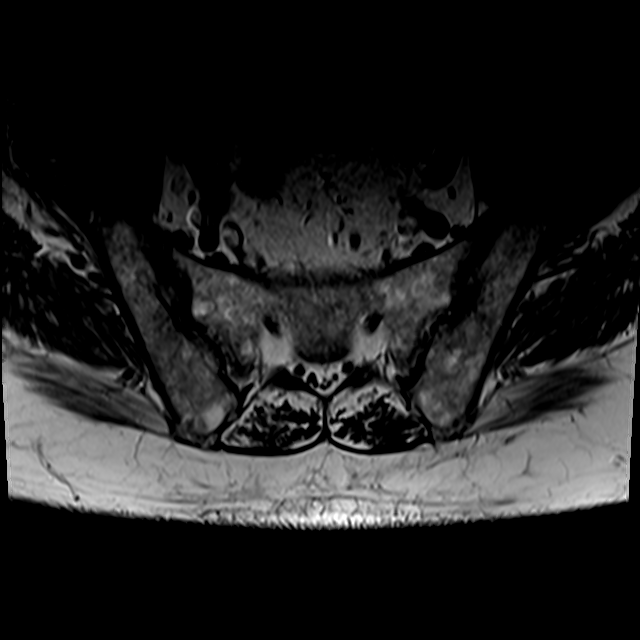
[im 4/27]
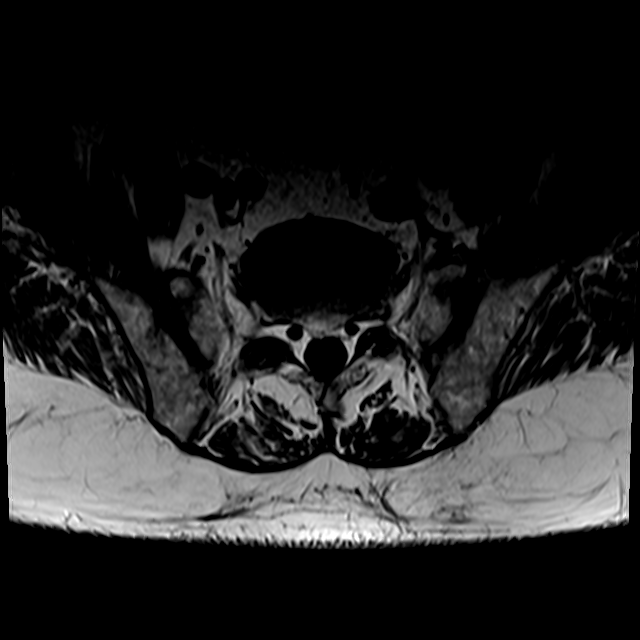
[im 14/27]
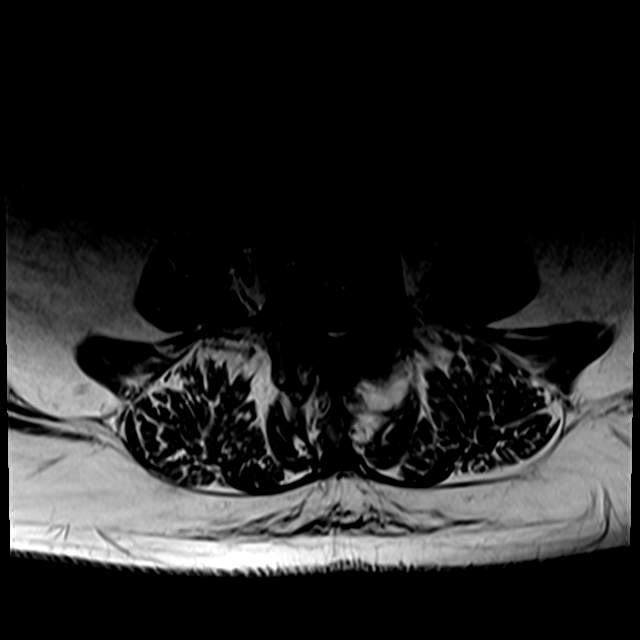
[im 23/27]
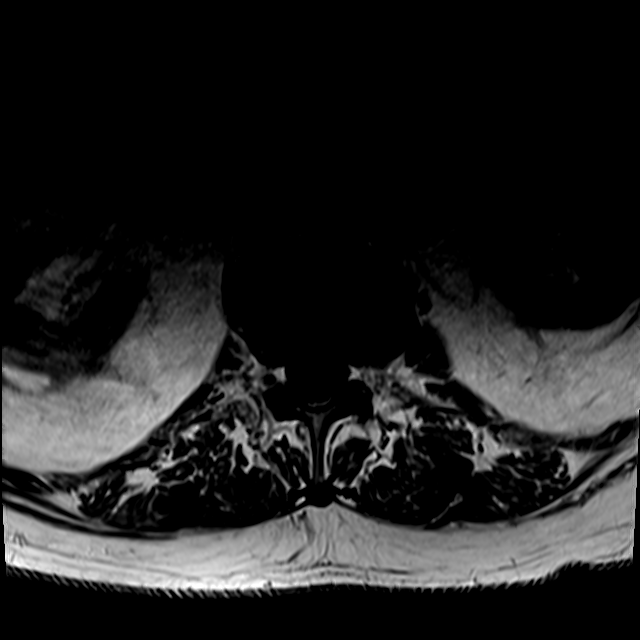

[24 of 48 positions shown; findings below may reference images not displayed]

FINDINGS: MRI CERVICAL SPINE FINDINGS

Alignment: 3 mm anterolisthesis at C7-T1.

Vertebrae: No fracture, evidence of discitis, or bone lesion.

Cord: Normal signal and morphology.  No intradural enhancement.

Posterior Fossa, vertebral arteries, paraspinal tissues: Negative.

Disc levels:

C2-C3: Partial interbody and posterior element ankylosis. No
stenosis.

C3-C4: No significant disc bulge or herniation. Posterior element
ankylosis and spurring. Mild bilateral neuroforaminal stenosis. No
spinal canal stenosis.

C4-C5: No significant disc bulge or herniation. Moderate left
greater than right facet uncovertebral hypertrophy. Moderate left
and mild right neuroforaminal stenosis. No spinal canal stenosis.

C5-C6: Circumferential disc osteophyte complex. Severe bilateral
uncovertebral hypertrophy. Mild bilateral facet arthropathy. Mild
spinal canal stenosis. Severe bilateral neuroforaminal stenosis.

C6-C7: Circumferential disc osteophyte complex. Severe bilateral
uncovertebral hypertrophy. Mild bilateral facet arthropathy. Mild
spinal canal stenosis. Severe left and mild-to-moderate right
neuroforaminal stenosis.

C7-T1: Negative disc. Severe bilateral facet arthropathy. No
stenosis.

MRI THORACIC SPINE FINDINGS

Alignment:  Trace anterolisthesis at T1-T2, T2-T3, and T4-T5.

Vertebrae: No fracture, evidence of discitis, or bone lesion.

Cord:  Normal signal and morphology.  No intradural enhancement.

Paraspinal and other soft tissues: Trace bilateral pleural
effusions.

Disc levels:

Scattered small disc protrusions. No spinal canal or neuroforaminal
stenosis at any level.

MRI LUMBAR SPINE FINDINGS

Segmentation: Transitional lumbosacral anatomy with partial
lumbarization of S1.

Alignment: Trace retrolisthesis at L2-L3 and L5-S1. 4 mm
anterolisthesis at L4-L5.

Vertebrae: No fracture, evidence of discitis, or bone lesion.
Asymmetric right-sided degenerative endplate marrow edema at L2-L3.

Conus medullaris and cauda equina: Conus extends to the L1-L2 level.
Conus and cauda equina appear normal. No intradural enhancement.

Paraspinal and other soft tissues: Negative.

Disc levels:

T12-L1:  Negative.

L1-L2: Small circumferential disc osteophyte complex and moderate
bilateral facet arthropathy. No stenosis.

L2-L3: Moderate circumferential disc osteophyte complex. Moderate
bilateral facet arthropathy with ligamentum flavum hypertrophy.
Severe spinal canal stenosis. Moderate left and mild right
neuroforaminal stenosis.

L3-L4: Mild disc bulging and endplate spurring asymmetric to the
left. Moderate bilateral facet arthropathy with ligamentum flavum
hypertrophy. Mild spinal canal and bilateral neuroforaminal
stenosis.

L4-L5: Disc uncovering and mild disc bulging. Severe bilateral facet
arthropathy with ligamentum flavum hypertrophy. Severe spinal canal
stenosis. Mild bilateral neuroforaminal stenosis.

L5-S1: Small circumferential disc osteophyte complex eccentric to
the right. Moderate bilateral facet arthropathy. Mild right
neuroforaminal stenosis. No spinal canal or left neuroforaminal
stenosis.

S1-S2: Rudimentary disc.  No stenosis.
IMPRESSION: 1. No evidence of metastatic disease.  No acute abnormality.
2. Multilevel cervical spondylosis as described above. Severe
bilateral neuroforaminal stenosis at C5-C6 and left neuroforaminal
stenosis at C6-C7.
3. Severe spinal canal stenosis at L2-L3 and L4-L5.
4. Transitional lumbosacral anatomy. Correlation with radiographs is
recommended prior to any operative intervention.
5. Trace bilateral pleural effusions.

## 2020-12-30 IMAGING — CT CT HEAD W/O CM
4 series · 16 of 47 positions shown, 18 images · non-contrast
Comparison: None

CLINICAL DATA: Status post fall.

EXAM:
CT HEAD WITHOUT CONTRAST
CT CERVICAL SPINE WITHOUT CONTRAST
TECHNIQUE: Multidetector CT imaging of the head and cervical spine was
performed following the standard protocol without intravenous
contrast. Multiplanar CT image reconstructions of the cervical spine
were also generated.

[Series 3: head without · axial · non-contrast · 0.43mm/px · z∈[-77,+43]mm · 7 of 33 slices shown, 9 images]
[im 5/33  brain]
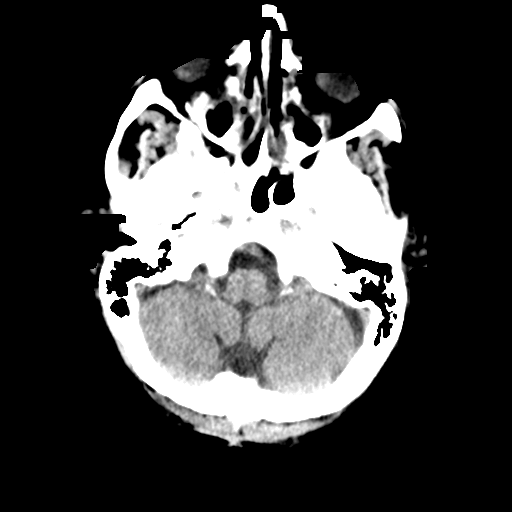
[im 5/33  bone]
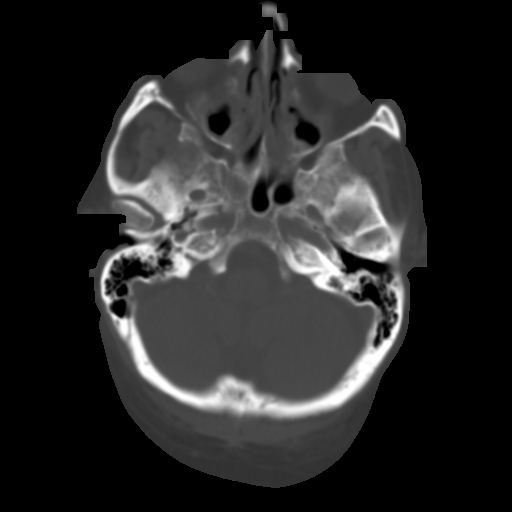
[im 9/33  brain]
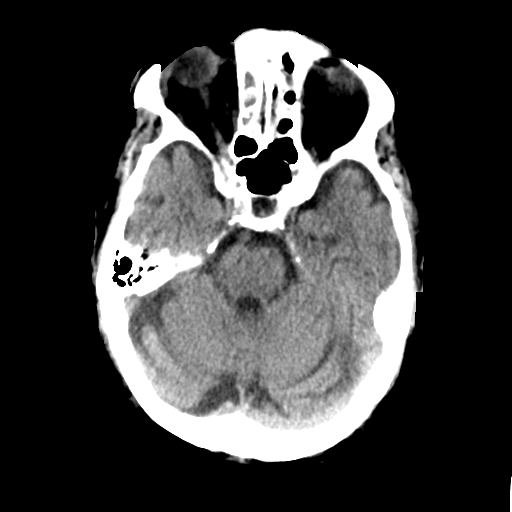
[im 13/33  brain]
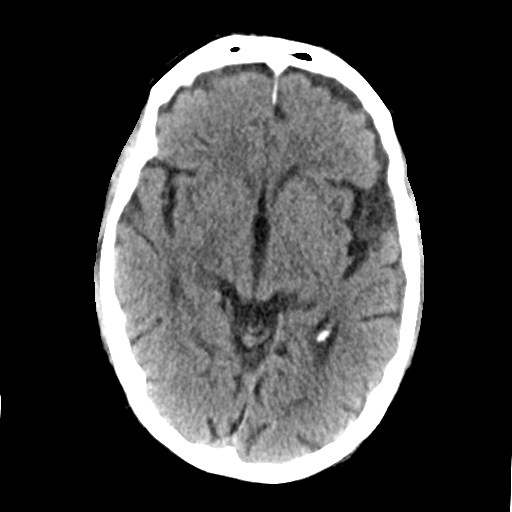
[im 17/33  brain]
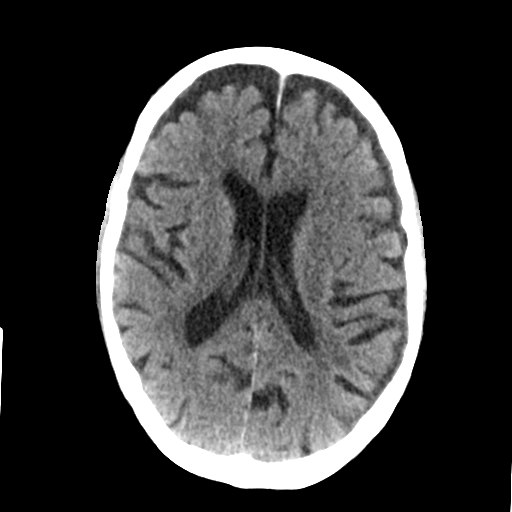
[im 21/33  brain]
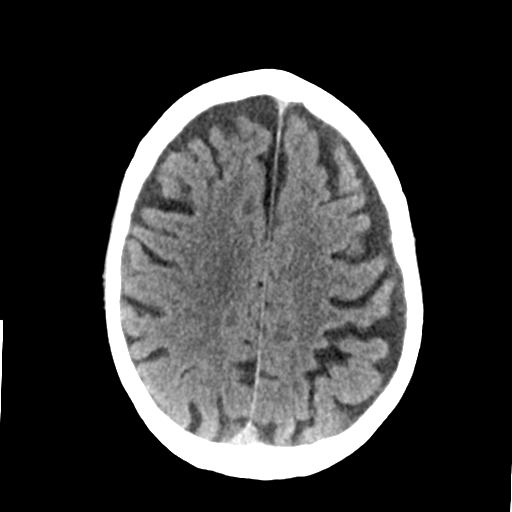
[im 21/33  bone]
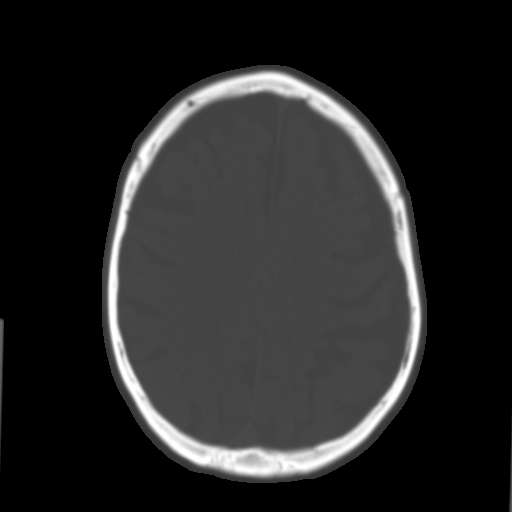
[im 25/33  brain]
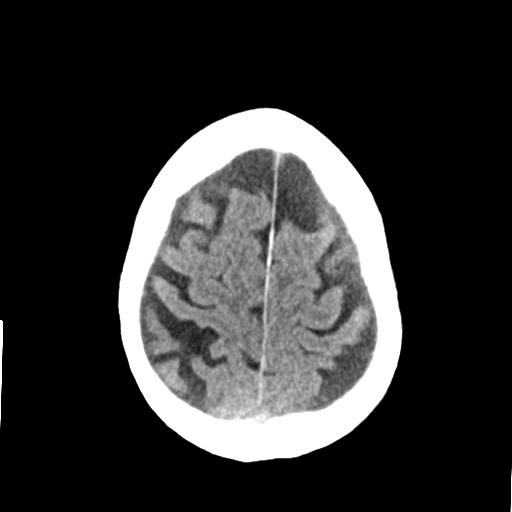
[im 29/33  brain]
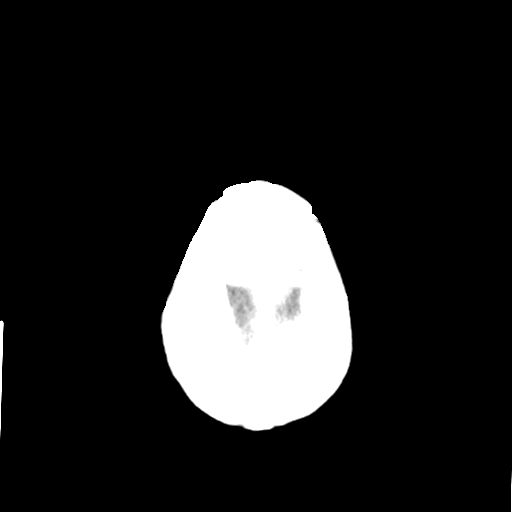

[Series 4: head bone · axial · 0.43mm/px · z∈[-81,-49]mm · 3 of 81 slices shown]
[im 9/81  bone]
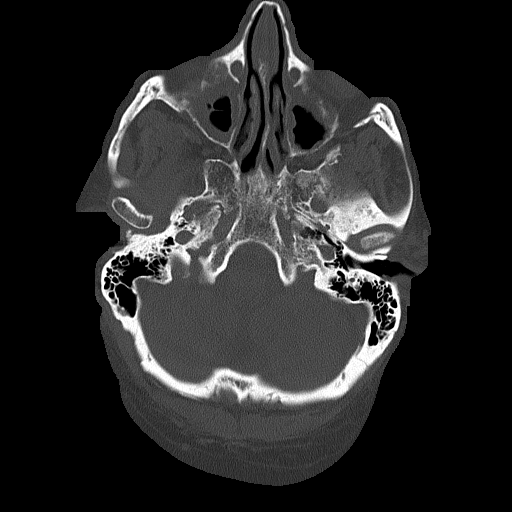
[im 17/81  bone]
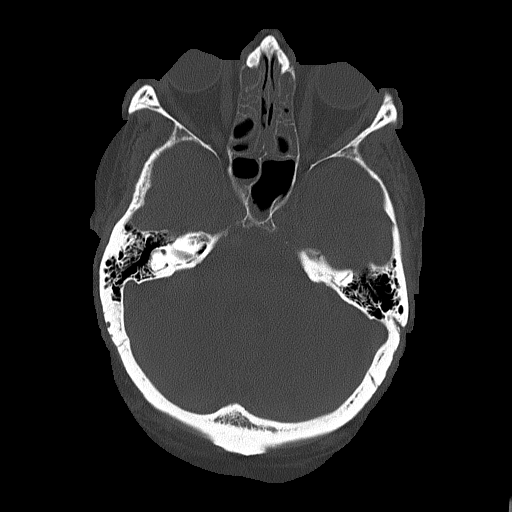
[im 25/81  bone]
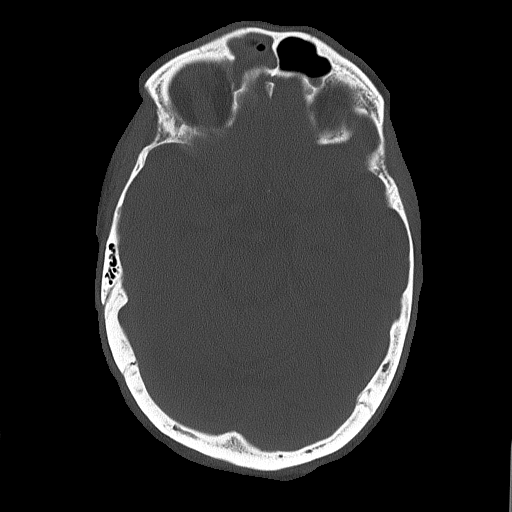

[Series 5: head without cor · coronal · non-contrast · 0.33mm/px · 3 of 72 slices shown]
[im 24/72  brain]
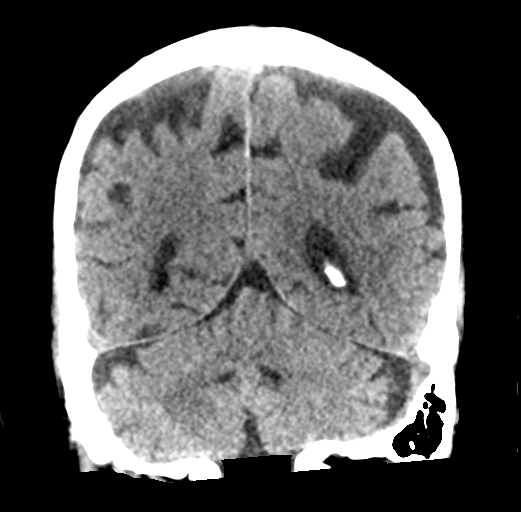
[im 32/72  brain]
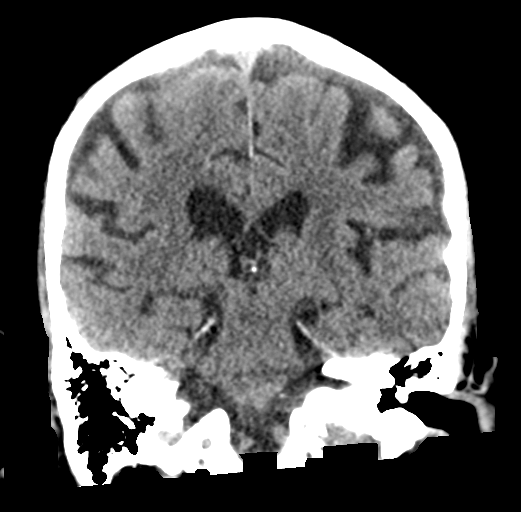
[im 40/72  brain]
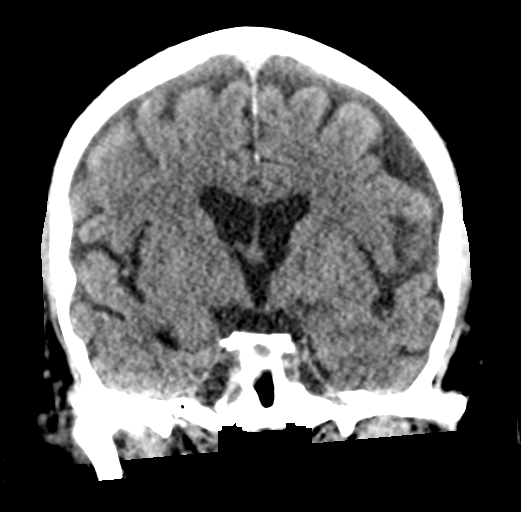

[Series 6: head without sag · sagittal · non-contrast · 0.34mm/px · 3 of 59 slices shown]
[im 20/59  brain]
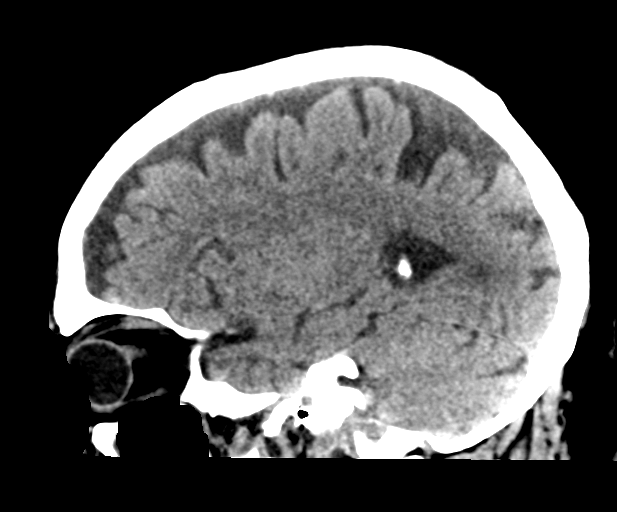
[im 30/59  brain]
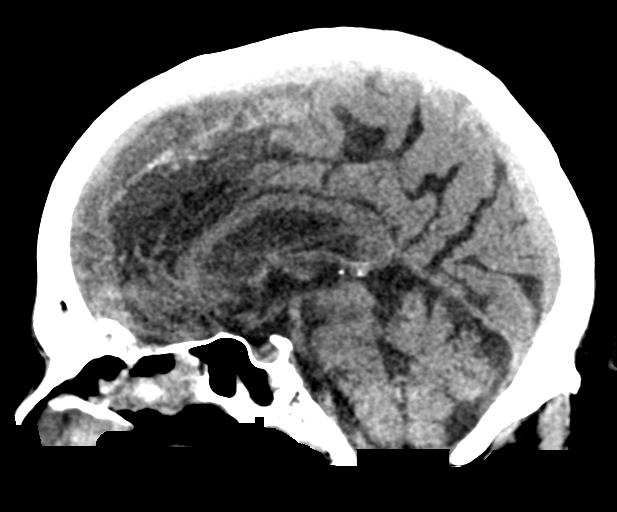
[im 39/59  brain]
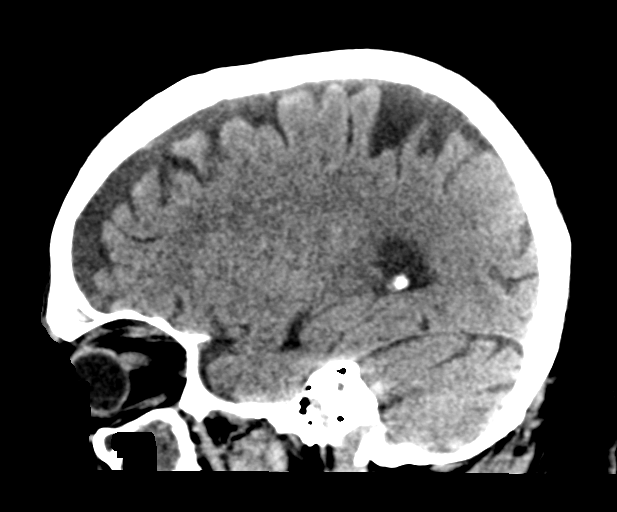

[16 of 47 positions shown; findings below may reference images not displayed]

FINDINGS: CT HEAD FINDINGS

Brain: No evidence of acute infarction, hemorrhage, hydrocephalus,
extra-axial collection or mass lesion/mass effect. There is marked
prominence of the CSF spaces overlying the cerebral hemispheres
which may reflect prominent brain atrophy and or chronic subdural
hygromas, image [DATE].

Vascular: No hyperdense vessel or unexpected calcification.

Skull: Choose 1

Sinuses/Orbits: Near complete opacification of the right maxillary
sinus and ethmoid air cells. Moderate mucosal thickening involves
the left maxillary sinus. Air-fluid levels noted within the sphenoid
sinus. Partial opacification of the frontal sinuses. Mastoid air
cells appear clear.

Other: None

CT CERVICAL SPINE FINDINGS

Alignment: Normal.

Skull base and vertebrae: No acute fracture. No primary bone lesion
or focal pathologic process.

Soft tissues and spinal canal: No prevertebral fluid or swelling. No
visible canal hematoma.

Disc levels: Multilevel disc space narrowing and endplate spurring
identified. This is most advanced at C5-6 and C6-7. Bilateral facet
arthropathy noted.

Upper chest: Negative.

Other: None
IMPRESSION: 1. No acute intracranial abnormalities.
2. Prominence of the CSF spaces overlying the cerebral hemispheres
may reflect prominent brain atrophy and/or chronic subdural
hygromas.
3. No evidence for cervical spine fracture.
4. Cervical spondylosis.

## 2020-12-30 IMAGING — MR MR THORACIC SPINE WO/W CM
6 of 10 series · 19 of 48 positions shown · IV contrast (gadavist)
Comparison: CT cervical spine dated [DATE].

CLINICAL DATA: Lower extremity weakness for 1 week. History of
prostate cancer.

EXAM:
MRI CERVICAL, THORACIC AND LUMBAR SPINE WITHOUT AND WITH CONTRAST
TECHNIQUE: Multiplanar and multiecho pulse sequences of the cervical spine, to
include the craniocervical junction and cervicothoracic junction,
and thoracic and lumbar spine, were obtained without and with
intravenous contrast.
CONTRAST:  9mL GADAVIST GADOBUTROL 1 MMOL/ML IV SOLN

[Series 17: T1 · sagittal · 3.0mm · 0.62mm/px · 2 of 9 slices shown (1 of 4)]
[im 1/9]
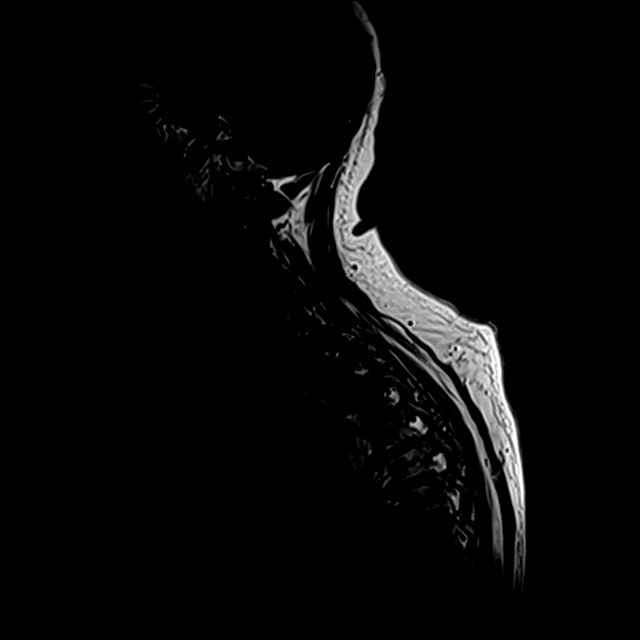
[im 9/9]
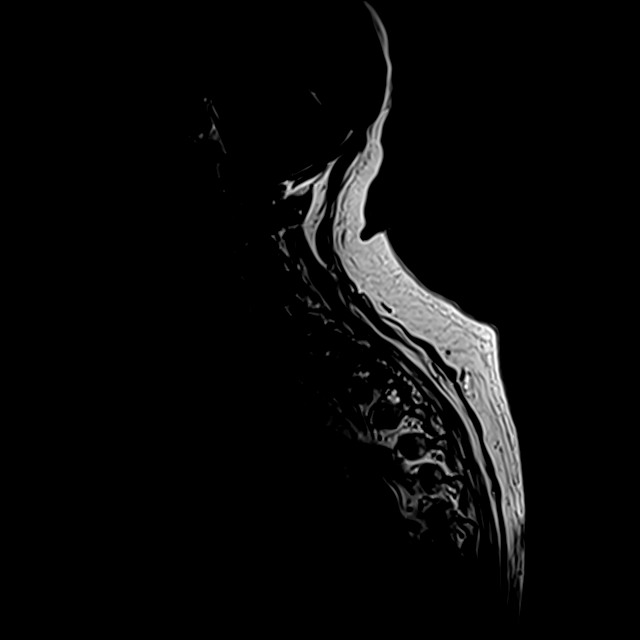

[Series 18: T1 · sagittal · 3.3mm · 0.62mm/px · 2 of 9 slices shown (2 of 4)]
[im 1/9]
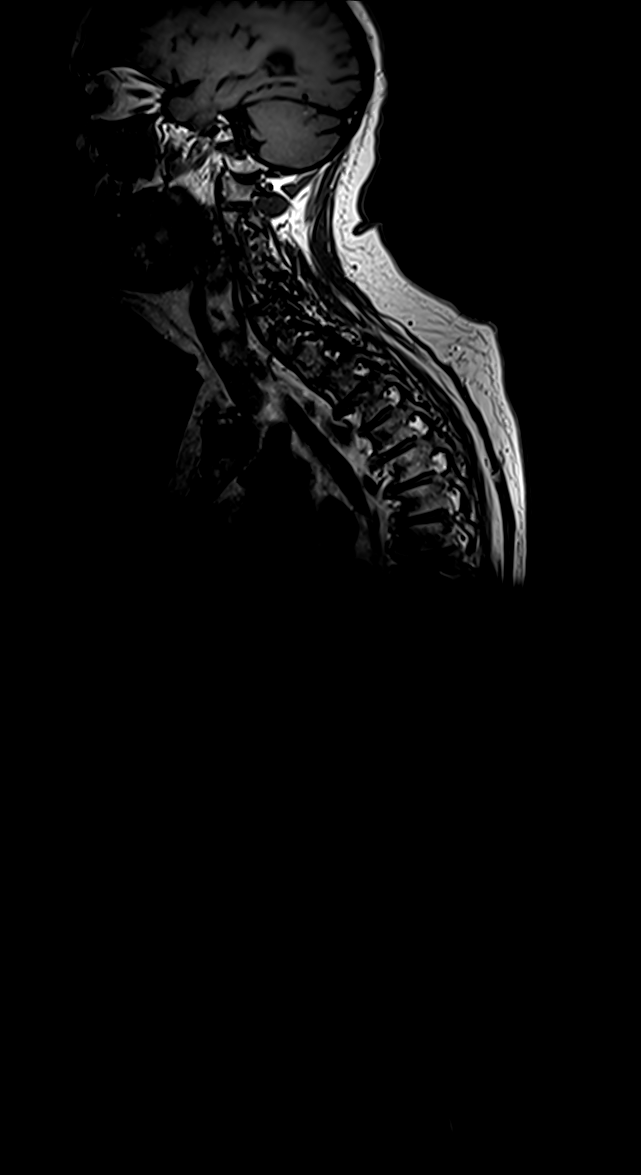
[im 9/9]
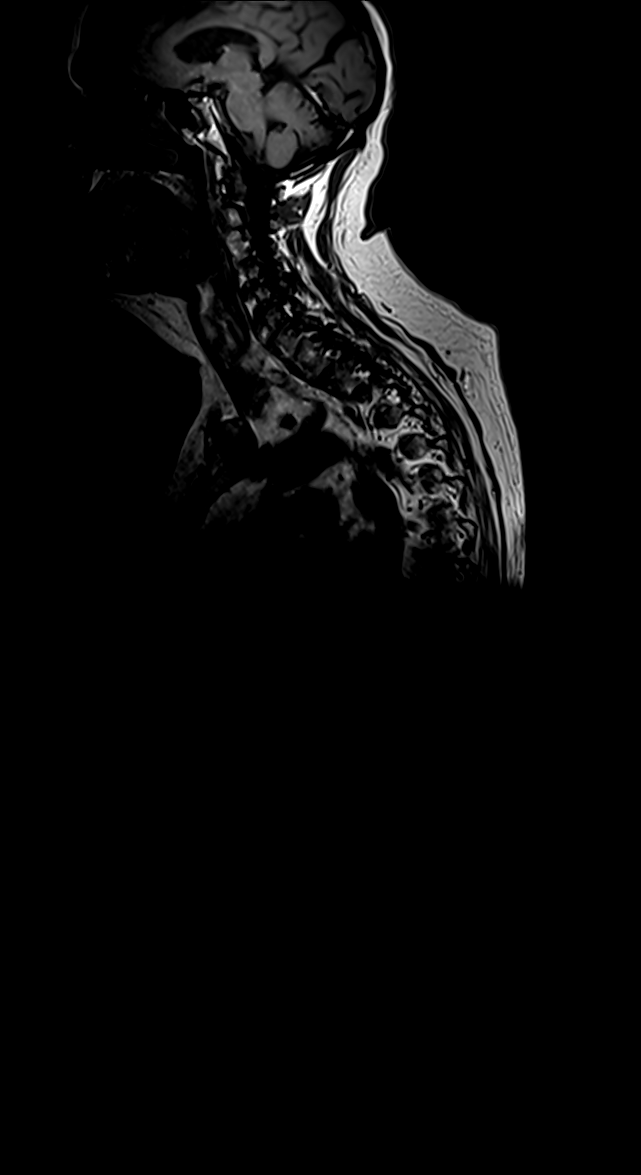

[Series 19: T2 · sagittal · 3.0mm · 0.76mm/px · 3 of 17 slices shown (1 of 2)]
[im 1/17]
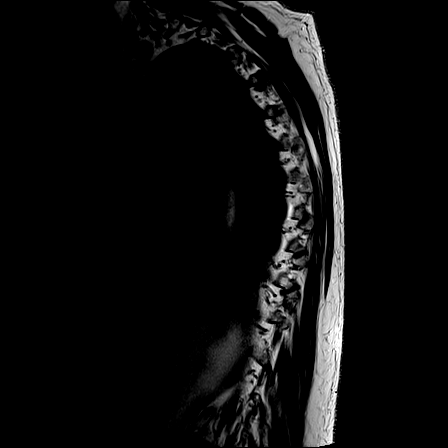
[im 9/17]
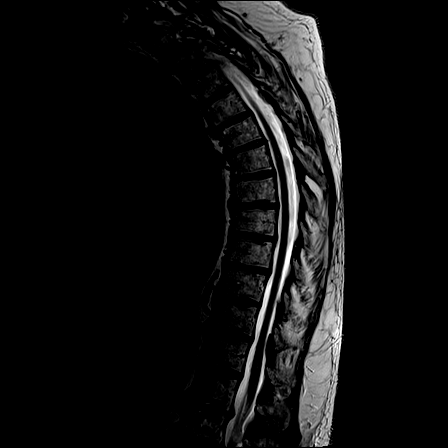
[im 17/17]
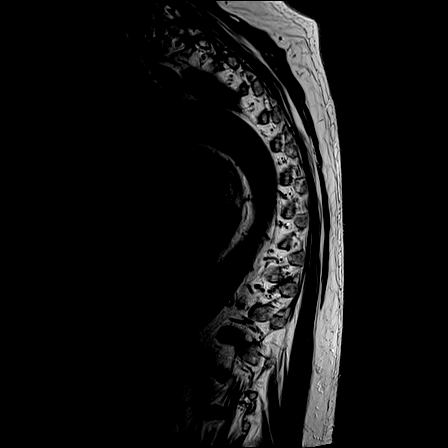

[Series 20: T1 · sagittal · 3.0mm · 0.76mm/px · 3 of 17 slices shown (3 of 4)]
[im 1/17]
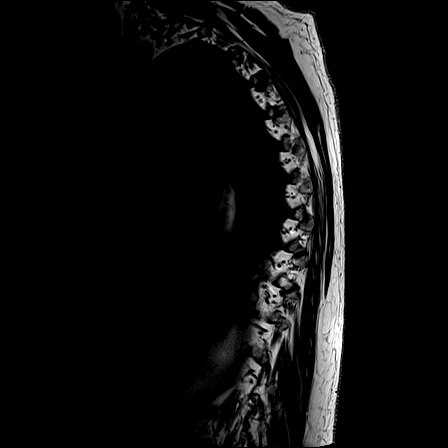
[im 9/17]
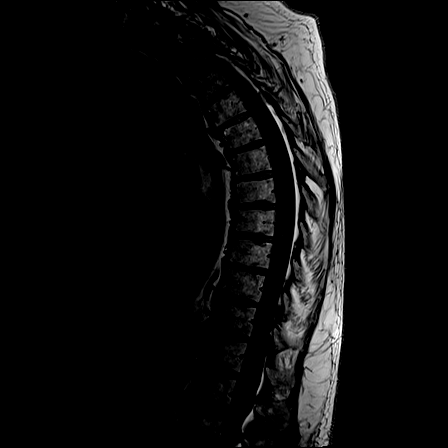
[im 17/17]
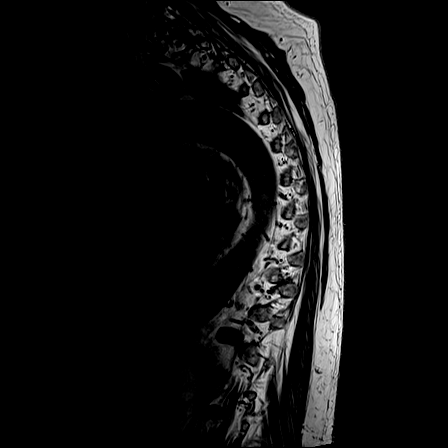

[Series 22: T2 · axial · 5.0mm · 0.59mm/px · z∈[-347,-23]mm · 8 of 39 slices shown (2 of 2)]
[im 1/39]
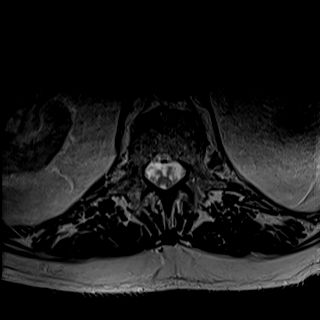
[im 6/39]
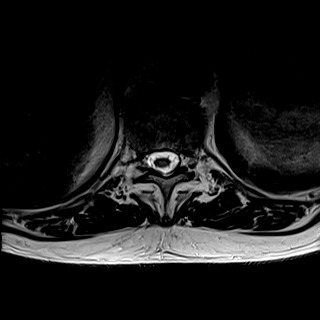
[im 11/39]
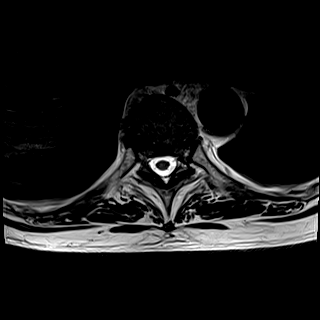
[im 17/39]
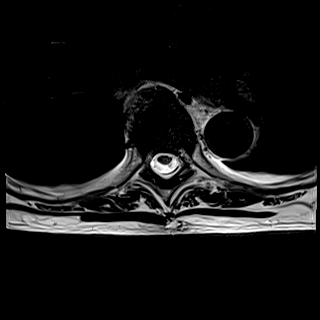
[im 22/39]
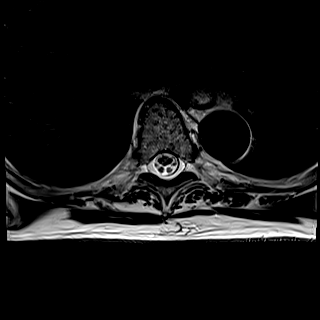
[im 28/39]
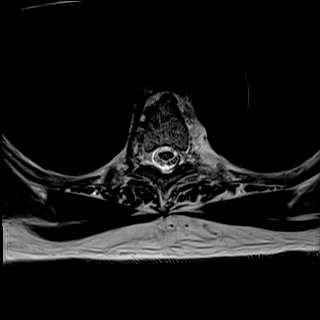
[im 33/39]
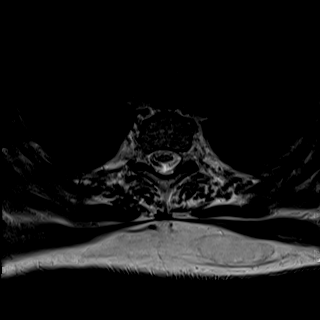
[im 39/39]
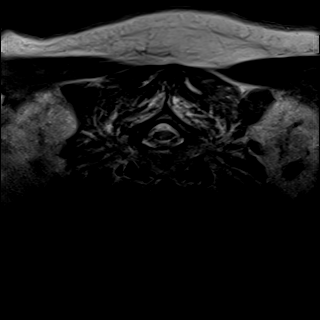

[Series 24: T1 · axial · non-contrast · 5.0mm · 0.31mm/px · 1 of 39 slices shown (4 of 4)]
[im 1/39]
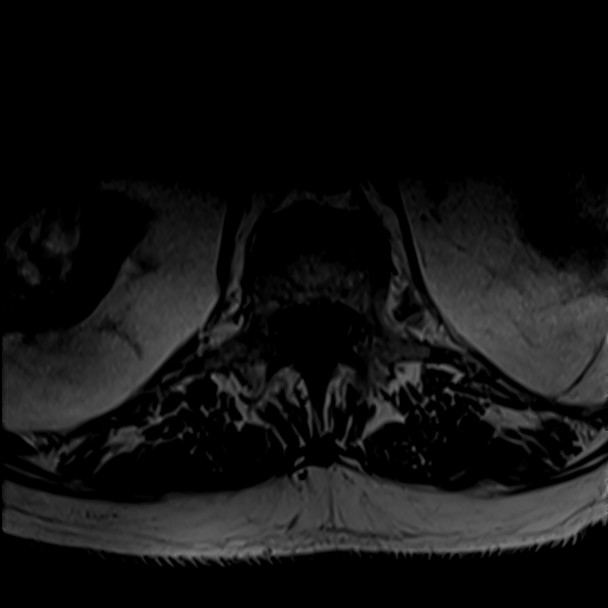

[19 of 48 positions shown; findings below may reference images not displayed]

FINDINGS: MRI CERVICAL SPINE FINDINGS

Alignment: 3 mm anterolisthesis at C7-T1.

Vertebrae: No fracture, evidence of discitis, or bone lesion.

Cord: Normal signal and morphology.  No intradural enhancement.

Posterior Fossa, vertebral arteries, paraspinal tissues: Negative.

Disc levels:

C2-C3: Partial interbody and posterior element ankylosis. No
stenosis.

C3-C4: No significant disc bulge or herniation. Posterior element
ankylosis and spurring. Mild bilateral neuroforaminal stenosis. No
spinal canal stenosis.

C4-C5: No significant disc bulge or herniation. Moderate left
greater than right facet uncovertebral hypertrophy. Moderate left
and mild right neuroforaminal stenosis. No spinal canal stenosis.

C5-C6: Circumferential disc osteophyte complex. Severe bilateral
uncovertebral hypertrophy. Mild bilateral facet arthropathy. Mild
spinal canal stenosis. Severe bilateral neuroforaminal stenosis.

C6-C7: Circumferential disc osteophyte complex. Severe bilateral
uncovertebral hypertrophy. Mild bilateral facet arthropathy. Mild
spinal canal stenosis. Severe left and mild-to-moderate right
neuroforaminal stenosis.

C7-T1: Negative disc. Severe bilateral facet arthropathy. No
stenosis.

MRI THORACIC SPINE FINDINGS

Alignment:  Trace anterolisthesis at T1-T2, T2-T3, and T4-T5.

Vertebrae: No fracture, evidence of discitis, or bone lesion.

Cord:  Normal signal and morphology.  No intradural enhancement.

Paraspinal and other soft tissues: Trace bilateral pleural
effusions.

Disc levels:

Scattered small disc protrusions. No spinal canal or neuroforaminal
stenosis at any level.

MRI LUMBAR SPINE FINDINGS

Segmentation: Transitional lumbosacral anatomy with partial
lumbarization of S1.

Alignment: Trace retrolisthesis at L2-L3 and L5-S1. 4 mm
anterolisthesis at L4-L5.

Vertebrae: No fracture, evidence of discitis, or bone lesion.
Asymmetric right-sided degenerative endplate marrow edema at L2-L3.

Conus medullaris and cauda equina: Conus extends to the L1-L2 level.
Conus and cauda equina appear normal. No intradural enhancement.

Paraspinal and other soft tissues: Negative.

Disc levels:

T12-L1:  Negative.

L1-L2: Small circumferential disc osteophyte complex and moderate
bilateral facet arthropathy. No stenosis.

L2-L3: Moderate circumferential disc osteophyte complex. Moderate
bilateral facet arthropathy with ligamentum flavum hypertrophy.
Severe spinal canal stenosis. Moderate left and mild right
neuroforaminal stenosis.

L3-L4: Mild disc bulging and endplate spurring asymmetric to the
left. Moderate bilateral facet arthropathy with ligamentum flavum
hypertrophy. Mild spinal canal and bilateral neuroforaminal
stenosis.

L4-L5: Disc uncovering and mild disc bulging. Severe bilateral facet
arthropathy with ligamentum flavum hypertrophy. Severe spinal canal
stenosis. Mild bilateral neuroforaminal stenosis.

L5-S1: Small circumferential disc osteophyte complex eccentric to
the right. Moderate bilateral facet arthropathy. Mild right
neuroforaminal stenosis. No spinal canal or left neuroforaminal
stenosis.

S1-S2: Rudimentary disc.  No stenosis.
IMPRESSION: 1. No evidence of metastatic disease.  No acute abnormality.
2. Multilevel cervical spondylosis as described above. Severe
bilateral neuroforaminal stenosis at C5-C6 and left neuroforaminal
stenosis at C6-C7.
3. Severe spinal canal stenosis at L2-L3 and L4-L5.
4. Transitional lumbosacral anatomy. Correlation with radiographs is
recommended prior to any operative intervention.
5. Trace bilateral pleural effusions.

## 2020-12-30 MED ORDER — FLUTICASONE PROPIONATE 50 MCG/ACT NA SUSP
2.0000 | Freq: Every day | NASAL | Status: DC
  Administered 2020-12-30 – 2021-01-01 (×3): 2 via NASAL
  Filled 2020-12-30 (×2): qty 16

## 2020-12-30 MED ORDER — FLUTICASONE FUROATE-VILANTEROL 100-25 MCG/ACT IN AEPB
1.0000 | INHALATION_SPRAY | Freq: Every day | RESPIRATORY_TRACT | Status: DC
  Filled 2020-12-30 (×2): qty 28

## 2020-12-30 MED ORDER — SACCHAROMYCES BOULARDII 250 MG PO CAPS
250.0000 mg | ORAL_CAPSULE | Freq: Two times a day (BID) | ORAL | Status: DC
  Administered 2020-12-30 – 2021-01-01 (×4): 250 mg via ORAL
  Filled 2020-12-30 (×5): qty 1

## 2020-12-30 MED ORDER — HYDRALAZINE HCL 25 MG PO TABS: 25.0000 mg | ORAL_TABLET | Freq: Four times a day (QID) | ORAL | Status: DC | PRN

## 2020-12-30 MED ORDER — FLECAINIDE ACETATE 100 MG PO TABS
100.0000 mg | ORAL_TABLET | Freq: Two times a day (BID) | ORAL | Status: DC
  Filled 2020-12-30: qty 1

## 2020-12-30 MED ORDER — SODIUM CHLORIDE 0.9 % IV SOLN: INTRAVENOUS | Status: AC

## 2020-12-30 MED ORDER — ACETAMINOPHEN 325 MG PO TABS: 650.0000 mg | ORAL_TABLET | Freq: Four times a day (QID) | ORAL | Status: DC | PRN

## 2020-12-30 MED ORDER — AMLODIPINE BESYLATE 5 MG PO TABS
5.0000 mg | ORAL_TABLET | Freq: Every day | ORAL | Status: DC
  Administered 2020-12-31 – 2021-01-01 (×2): 5 mg via ORAL
  Filled 2020-12-30 (×2): qty 1

## 2020-12-30 MED ORDER — ATORVASTATIN CALCIUM 10 MG PO TABS
20.0000 mg | ORAL_TABLET | Freq: Every evening | ORAL | Status: DC
  Administered 2020-12-30 – 2020-12-31 (×2): 20 mg via ORAL
  Filled 2020-12-30 (×2): qty 2

## 2020-12-30 MED ORDER — BUPIVACAINE HCL (PF) 0.5 % IJ SOLN
10.0000 mL | Freq: Once | INTRAMUSCULAR | Status: DC
  Filled 2020-12-30: qty 10

## 2020-12-30 MED ORDER — APIXABAN 5 MG PO TABS
5.0000 mg | ORAL_TABLET | Freq: Two times a day (BID) | ORAL | Status: DC
  Administered 2020-12-30 – 2021-01-01 (×4): 5 mg via ORAL
  Filled 2020-12-30 (×4): qty 1

## 2020-12-30 MED ORDER — SODIUM CHLORIDE 0.9 % IV BOLUS
1000.0000 mL | Freq: Once | INTRAVENOUS | Status: AC
  Administered 2020-12-30: 1000 mL via INTRAVENOUS

## 2020-12-30 MED ORDER — TETANUS-DIPHTH-ACELL PERTUSSIS 5-2.5-18.5 LF-MCG/0.5 IM SUSY
0.5000 mL | PREFILLED_SYRINGE | Freq: Once | INTRAMUSCULAR | Status: AC
  Administered 2020-12-30: 0.5 mL via INTRAMUSCULAR
  Filled 2020-12-30: qty 0.5

## 2020-12-30 MED ORDER — ACETAMINOPHEN 650 MG RE SUPP: 650.0000 mg | Freq: Four times a day (QID) | RECTAL | Status: DC | PRN

## 2020-12-30 MED ORDER — ALBUTEROL SULFATE HFA 108 (90 BASE) MCG/ACT IN AERS
1.0000 | INHALATION_SPRAY | Freq: Four times a day (QID) | RESPIRATORY_TRACT | Status: DC | PRN
Start: 2020-12-30 — End: 2020-12-30

## 2020-12-30 MED ORDER — GABAPENTIN 300 MG PO CAPS
300.0000 mg | ORAL_CAPSULE | Freq: Every day | ORAL | Status: DC
  Administered 2020-12-30 – 2020-12-31 (×2): 300 mg via ORAL
  Filled 2020-12-30 (×2): qty 1

## 2020-12-30 MED ORDER — MONTELUKAST SODIUM 10 MG PO TABS
10.0000 mg | ORAL_TABLET | Freq: Every day | ORAL | Status: DC
  Administered 2020-12-30 – 2020-12-31 (×2): 10 mg via ORAL
  Filled 2020-12-30 (×3): qty 1

## 2020-12-30 MED ORDER — ALBUTEROL SULFATE (2.5 MG/3ML) 0.083% IN NEBU: 2.5000 mg | INHALATION_SOLUTION | Freq: Four times a day (QID) | RESPIRATORY_TRACT | Status: DC | PRN

## 2020-12-30 MED ORDER — GADOBUTROL 1 MMOL/ML IV SOLN
9.0000 mL | Freq: Once | INTRAVENOUS | Status: AC | PRN
  Administered 2020-12-30: 9 mL via INTRAVENOUS

## 2020-12-30 MED ORDER — FLUTICASONE FUROATE-VILANTEROL 100-25 MCG/INH IN AEPB: 1.0000 | INHALATION_SPRAY | Freq: Every day | RESPIRATORY_TRACT | Status: DC

## 2020-12-30 MED ORDER — CARVEDILOL 12.5 MG PO TABS
12.5000 mg | ORAL_TABLET | Freq: Two times a day (BID) | ORAL | Status: DC
  Administered 2020-12-30 – 2021-01-01 (×4): 12.5 mg via ORAL
  Filled 2020-12-30 (×4): qty 1

## 2020-12-30 MED ORDER — INSULIN ASPART 100 UNIT/ML IJ SOLN
0.0000 [IU] | Freq: Three times a day (TID) | INTRAMUSCULAR | Status: DC
  Administered 2020-12-31 – 2021-01-01 (×2): 1 [IU] via SUBCUTANEOUS

## 2020-12-30 MED ORDER — FLECAINIDE ACETATE 100 MG PO TABS
100.0000 mg | ORAL_TABLET | Freq: Every day | ORAL | Status: DC
  Administered 2020-12-30 – 2021-01-01 (×3): 100 mg via ORAL
  Filled 2020-12-30 (×3): qty 1

## 2020-12-30 MED ORDER — DOXYCYCLINE HYCLATE 100 MG PO TABS
100.0000 mg | ORAL_TABLET | Freq: Two times a day (BID) | ORAL | Status: DC
Start: 2020-12-30 — End: 2021-01-01
  Administered 2020-12-30 – 2021-01-01 (×4): 100 mg via ORAL
  Filled 2020-12-30 (×4): qty 1

## 2020-12-30 NOTE — Evaluation (Signed)
Physical Therapy Evaluation Patient Details Name: Charles Lloyd. MRN: [id number] DOB: Feb 12, 1941 Today's Date: 12/30/2020  History of Present Illness  Pt is 80 yo male admitted on 12/30/20 with recent falls and leg weakness. Initial eval was for imminent d/c, but pt found to have AKI and being admitted.  Pt also had MRI spine that revealed stenosis but no acute injury.  Pt with hx including but not limited to cardioversion 2 weeks ago, DM, peripheral neuropathy, CHF, bil TKA, and afib.  Clinical Impression  Pt admitted with above diagnosis. At baseline, pt is independent, ambulatory, and enjoys playing golf.  He does have peripheral neuropathy at baseline.  Pt has had sudden weakness and falls over the past 2 weeks, but normally no hx of falls.  He lives alone in 2 level townhome, but reports son nearby and could assist initially.  Today pt presenting with decreased LE strength, mobility, balance, and safety awareness.  Pt with tremors throughout that he reports is not baseline.  Pt is expected to progress well as AKI is managed.  Hopeful for ability to return home without need for SNF, but do recommend initial supervision. Pt currently with functional limitations due to the deficits listed below (see PT Problem List). Pt will benefit from skilled PT to increase their independence and safety with mobility to allow discharge to the venue listed below.          Recommendations for follow up therapy are one component of a multi-disciplinary discharge planning process, led by the attending physician.  Recommendations may be updated based on patient status, additional functional criteria and insurance authorization.  Follow Up Recommendations Home health PT    Assistance Recommended at Discharge Frequent or constant Supervision/Assistance (initally)  Functional Status Assessment Patient has had a recent decline in their functional status and demonstrates the ability to make significant improvements  in function in a reasonable and predictable amount of time.  Equipment Recommendations  Rolling walker (2 wheels) (son may get for pt?)    Recommendations for Other Services       Precautions / Restrictions Precautions Precautions: Fall      Mobility  Bed Mobility Overal bed mobility: Needs Assistance Bed Mobility: Supine to Sit;Sit to Supine     Supine to sit: Min assist Sit to supine: Min assist        Transfers Overall transfer level: Needs assistance Equipment used: Rolling walker (2 wheels) Transfers: Sit to/from Stand Sit to Stand: Min guard;From elevated surface           General transfer comment: min guard for safety and RW    Ambulation/Gait Ambulation/Gait assistance: Min guard Gait Distance (Feet): 30 Feet Assistive device: Rolling walker (2 wheels) Gait Pattern/deviations: Step-to pattern;Decreased stride length Gait velocity: decreased   General Gait Details: Pt easily fatigued and shakey.  REquirng min guard for safety but no LOB  Stairs            Wheelchair Mobility    Modified Rankin (Stroke Patients Only)       Balance Overall balance assessment: Needs assistance Sitting-balance support: No upper extremity supported;Feet unsupported Sitting balance-Leahy Scale: Fair Sitting balance - Comments: Able to maintain balance but leans back during MMT     Standing balance-Leahy Scale: Poor Standing balance comment: requirng RW - not his baseline                             Pertinent Vitals/Pain  Pain Assessment: 0-10 Pain Score: 6  Pain Location: R side and shoulder with movement Pain Descriptors / Indicators: Discomfort;Sore Pain Intervention(s): Limited activity within patient's tolerance;Monitored during session;Repositioned    Home Living Family/patient expects to be discharged to:: Private residence Living Arrangements: Alone Available Help at Discharge: Family;Available PRN/intermittently (son lives  nearby) Type of Home: House (townhouse) Home Access: Level entry     Alternate Level Stairs-Number of Steps: 16 Home Layout: Two level;Bed/bath upstairs;1/2 bath on main level Home Equipment: None      Prior Function Prior Level of Function : Independent/Modified Independent;Driving             Mobility Comments: Normally independent and enjoys playing golf;  Pt reports normally moves well but in past 2 weeks (after cardioversion) has had episodes of sudden weakness and falling on floor. ADLs Comments: Independent with ADLs/IADLs     Hand Dominance        Extremity/Trunk Assessment   Upper Extremity Assessment Upper Extremity Assessment: Overall WFL for tasks assessed (R sided rib pain (imaging negative) so did not MMT on R)    Lower Extremity Assessment Lower Extremity Assessment: LLE deficits/detail;RLE deficits/detail RLE Deficits / Details: ROM WFL; MMT: ankle DF 4-/5, knee ext 5/5, hip 4/5 RLE Sensation: decreased light touch;history of peripheral neuropathy LLE Deficits / Details: ROM WFL; MMT: ankle DF 4-/5, knee ext 5/5, hip 4/5 LLE Sensation: decreased light touch;history of peripheral neuropathy    Cervical / Trunk Assessment Cervical / Trunk Assessment: Kyphotic  Communication   Communication: No difficulties  Cognition Arousal/Alertness: Awake/alert Behavior During Therapy: WFL for tasks assessed/performed Overall Cognitive Status: Within Functional Limits for tasks assessed                                 General Comments: Pt somewhat focused on "well I walk fine normally," when discussing therapy.  Educated on acute PT role and how stenosis and AKI can affect his current mobility.        General Comments General comments (skin integrity, edema, etc.): VSS; educated on PT role, POC , and recommendations for RW, supervision at d/c (initially), and HHPT    Exercises     Assessment/Plan    PT Assessment Patient needs continued PT  services  PT Problem List Decreased strength;Decreased mobility;Decreased safety awareness;Decreased activity tolerance;Decreased balance;Decreased knowledge of use of DME;Pain       PT Treatment Interventions DME instruction;Therapeutic exercise;Gait training;Balance training;Stair training;Functional mobility training;Therapeutic activities;Patient/family education    PT Goals (Current goals can be found in the Care Plan section)  Acute Rehab PT Goals Patient Stated Goal: return home to normal PT Goal Formulation: With patient Time For Goal Achievement: 01/13/21 Potential to Achieve Goals: Good    Frequency Min 3X/week   Barriers to discharge Decreased caregiver support;Inaccessible home environment bedroom upstairs; says son lives 6 miles away but can assist    Co-evaluation               AM-PAC PT "6 Clicks" Mobility  Outcome Measure Help needed turning from your back to your side while in a flat bed without using bedrails?: A Little Help needed moving from lying on your back to sitting on the side of a flat bed without using bedrails?: A Little Help needed moving to and from a bed to a chair (including a wheelchair)?: A Little Help needed standing up from a chair using your arms (e.g., wheelchair or  bedside chair)?: A Little Help needed to walk in hospital room?: A Little Help needed climbing 3-5 steps with a railing? : A Lot 6 Click Score: 17    End of Session Equipment Utilized During Treatment: Gait belt Activity Tolerance: Patient tolerated treatment well Patient left: in bed;with call bell/phone within reach (in ED on stretchre) Nurse Communication: Mobility status PT Visit Diagnosis: Other abnormalities of gait and mobility (R26.89);Muscle weakness (generalized) (M62.81)    Time: 1659-1730 PT Time Calculation (min) (ACUTE ONLY): 31 min   Charges:   PT Evaluation $PT Eval Moderate Complexity: 1 Mod PT Treatments $Gait Training: 8-22 mins         Abran Richard, PT Acute Rehab Services Pager [phone] [facility] [phone]   Karlton Lemon 12/30/2020, 5:48 PM

## 2020-12-30 NOTE — Progress Notes (Signed)
PT Cancellation Note  Patient Details Name: Charles Lloyd. MRN: [id number] DOB: 1940/06/07   Cancelled Treatment:    Reason Eval/Treat Not Completed: Patient at procedure or test/unavailable.  In MRI and will retry as time and pt allow.   Ramond Dial 12/30/2020, 1:56 PM  Mee Hives, PT PhD Acute Rehab Dept. Number: [place] [facility] [place]

## 2020-12-30 NOTE — ED Provider Notes (Signed)
[facility] EMERGENCY DEPARTMENT Provider Note   CSN: [id number] Arrival date & time: 12/30/20  1127     History Chief Complaint  Patient presents with   Charles Lloyd. is a 80 y.o. male.  The history is provided by the patient and medical records. No language interpreter was used.  Fall    80 year old male with hx of afib on Eliquis, having cardioversion done 2 week ago, history of peripheral neuropathy who presents for evaluation of a fall.  Patient report this morning he was walking and his leg gave out on him causing him to fall on the right side.  Denies hitting his head but did struck his right shoulder and right arm against the ground.  He was having difficulty standing up because his legs felt very heavy "like a bag of cement" and was having trouble feeling his legs.  He reached out to his daughter who Bring him here.  He denies any headache, dizziness, vision changes, nausea, vomiting, chest pain, shortness of breath, abdominal pain or low back pain.  He mention having several episodes of falls within the past 2 weeks usually when his legs will feel heavy and gave out on him.  He also had a cardiac cardioversion done 2 weeks ago because his heart was in A. fib.  States he has had multiple cardiac ablation as well as cardioversion in the past.  He has been compliant with his Eliquis.  He denies any precipitating symptoms prior to the fall.  He is unsure his last tetanus status.  He was seen in the ED 3 days ago when he developed pain and swelling to his right middle finger.  His PCP felt that this is likely a skin infection and did prescribe Bactrim.  Report no improvement of his symptom despite taking antibiotic.  He was planning to see his PCP today for wound recheck.  He report having had numerous therapeutic procedures to help with his neuropathy which has helped.  He does not use anything to aid walking.    Past Medical History:  Diagnosis Date    Acute tracheobronchitis 01/05/2019   B12 deficiency    Basal cell carcinoma (BCC) of left side of nose 01/28/2018   Cancer ([place])    Cardiomyopathy ([place]) 03/10/2018   With chronic atrial fibrillation   CHF (congestive heart failure) ([place]) 04/16/2013   Functional class II, ejection fraction 35-40%  Formatting of this note might be different from the original. Functional class II, ejection fraction 35-40%   Chronic anticoagulation    Chronic diastolic (congestive) heart failure ([place]) 04/16/2013   Functional class II, ejection fraction 35-40%  Last Assessment & Plan:  Clinically stable Volume well controlled meds reviewed   Chronic diastolic CHF (congestive heart failure) ([place]) 04/16/2013   Functional class II, ejection fraction 35-40%  Last Assessment & Plan:  Clinically stable Volume well controlled meds reviewed   Colon polyps 04/16/2013   Diabetic neuropathy (HCC)    Eosinophil count raised 02/17/2019   Essential hypertension 01/06/2010   Last Assessment & Plan:  Well controlled Continue med management   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  Steroid usage was discontinued several months ago.  Follow up with Dermatology as planned.   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   Last Assessment & Plan:  Id following ?ongoing abx managementy reported Request records   Infection of prosthetic right knee joint ([place])    Insomnia 03/04/2012   Grief with loss of wife 02/20/12  Last Assessment & Plan:  Improved sx  contineu xanax prn Se discussed   Lesion of liver 04/20/2019   -On cardiac CT 02/2019   Mild reactive airways  disease    Neoplasm of prostate 04/16/2013   Neoplasm of prostate, 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 Transthoracic Echocardiogram-03/10/2013-[facility]: Normal left ventricular wall thickness and cavity size.  Global left ventricular systolic function is moderately reduced.  The estimated ejection fraction is 35-40%.  The left atrium is moderately enlarged.  The right atrium is mildly enlarged.  No significant valvular    Overweight (BMI 25.0-29.9) 10/22/2016   Persistent atrial fibrillation ([place]) 04/16/2013   Last Assessment & Plan:  Rate controlled Continue med management eliquis for stroke preventino  Formatting of this note might be different from the original.  Drug  HX Current Rx Pre-ABL inefficacy Pre-ABL intolerant Post-ABL inefficacy Post-ABL intolerant max dose/24h M/Y end comments  sotalol                  dofetilide                  flecainide                  propafenone                  am   S/P TKR (total knee replacement), bilateral    Secondary hypercoagulable state ([place]) 04/09/2019   Tinea cruris 04/16/2013   Type 2 diabetes mellitus with diabetic polyneuropathy, without long-term current use of insulin ([place]) 04/16/2013   Last Assessment & Plan:  Labs today Pt reports well controlled on ambulatory monitoring   Vitamin D deficiency 07/25/2018    Patient Active Problem List   Diagnosis Date Noted   S/P TKR (total knee replacement), bilateral    Infection of prosthetic right knee joint ([place])    Cancer ([place])    Arthralgia 11/20/2019   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   Lesion of liver 04/20/2019   Secondary hypercoagulable state ([place]) 04/09/2019   Eosinophil count raised  02/17/2019   Acute tracheobronchitis 01/05/2019   Asthma    Hyponatremia 12/17/2018   Vitamin D deficiency 07/25/2018   Cardiomyopathy ([place]) 03/10/2018   Chronic anticoagulation 03/06/2018   Hyperlipidemia associated with type 2 diabetes mellitus ([place]) 01/28/2018   Basal cell carcinoma (BCC) of left side of nose 01/28/2018   On continuous oral anticoagulation 01/28/2018   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   B12 deficiency 10/22/2016   Overweight (BMI 25.0-29.9) 10/22/2016   Primary osteoarthritis of right knee 03/29/2015   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   On amiodarone therapy 09/07/2013   Other activity(E029.9) 04/20/2013   Atrial fibrillation with controlled ventricular rate ([place]) 04/16/2013   Chronic diastolic (congestive) heart failure ([place]) 04/16/2013   CHF (congestive heart  failure) ([place]) 04/16/2013   Colon polyps 04/16/2013   Type 2 diabetes mellitus with diabetic polyneuropathy, without long-term current use of insulin ([place]) 04/16/2013   Tinea cruris 04/16/2013   Neoplasm of prostate 04/16/2013   Persistent atrial fibrillation ([place]) 04/16/2013   Chronic diastolic CHF (congestive heart failure) ([place]) 04/16/2013   Diabetic neuropathy ([place]) 07/14/2012   Insomnia 03/04/2012   Hypertensive heart disease with heart failure ([place]) 01/06/2010   Neoplasm of prostate, malignant ([place]) 01/06/2010   Hypercholesterolemia 01/06/2010   Essential hypertension 01/06/2010    Past Surgical History:  Procedure Laterality Date   ATRIAL FIBRILLATION ABLATION N/A 03/12/2019   Procedure: ATRIAL FIBRILLATION ABLATION;  Surgeon: [name], MD;  Location: [facility];  Service: Cardiovascular;  Laterality: N/A;   ATRIAL FIBRILLATION ABLATION N/A 04/29/2020   Procedure: ATRIAL FIBRILLATION ABLATION;  Surgeon: [name], MD;  Location: [facility];  Service: Cardiovascular;  Laterality: N/A;   CARDIAC ELECTROPHYSIOLOGY STUDY AND ABLATION     CARDIOVERSION      CARDIOVERSION N/A 10/10/2018   Procedure: CARDIOVERSION;  Surgeon: [name], MD;  Location: [facility] ENDOSCOPY;  Service: Cardiovascular;  Laterality: N/A;   CARDIOVERSION N/A 05/18/2020   Procedure: CARDIOVERSION;  Surgeon: [name], MD;  Location: [facility]OSCOPY;  Service: Cardiovascular;  Laterality: N/A;   CARDIOVERSION N/A 12/09/2020   Procedure: CARDIOVERSION;  Surgeon: Lelon Perla, MD;  Location: [facility] ENDOSCOPY;  Service: Cardiovascular;  Laterality: N/A;   CHOLECYSTECTOMY     PROSTATECTOMY     REPLACEMENT TOTAL KNEE BILATERAL         Family History  Problem Relation Age of Onset   Cancer Mother    Depression Mother    Early death Mother    Cancer Father    Depression Father    Early death Father     Social History   Tobacco Use   Smoking status: Never   Smokeless tobacco: Never  Vaping Use   Vaping Use: Never used  Substance Use Topics   Alcohol use: Yes    Alcohol/week: 4.0 standard drinks    Types: 2 Glasses of wine, 2 Standard drinks or equivalent per week    Comment: daily   Drug use: Never    Home Medications Prior to Admission medications   Medication Sig Start Date End Date Taking? Authorizing Provider  albuterol (VENTOLIN HFA) 108 (90 Base) MCG/ACT inhaler Inhale 1-2 puffs into the lungs every 6 (six) hours as needed for wheezing or shortness of breath. 10/05/20   Parrett, Fonnie Mu, NP  amLODipine (NORVASC) 5 MG tablet Take 1 tablet (5 mg total) by mouth daily. 06/10/20 12/07/20  Richardo Priest, MD  amoxicillin (AMOXIL) 500 MG capsule Take 2,000 mg by mouth See admin instructions. Take 4 capsules (2000 mg) by mouth 1 hour prior to dental procedures. 01/20/20   [provider]  apixaban (ELIQUIS) 5 MG TABS tablet Take 1 tablet (5 mg total) by mouth 2 (two) times daily. 12/15/20   Isaac Bliss, [name], MD  atorvastatin (LIPITOR) 20 MG tablet TAKE 1 TABLET DAILY AT 6 P.M. 03/22/20   Isaac Bliss, [name], MD  carvedilol (COREG)  12.5 MG tablet Take 1 tablet (12.5 mg total) by mouth 2 (two) times daily. 06/16/20 12/07/20  Tobb, Kardie, DO  flecainide (TAMBOCOR) 100 MG tablet Take 1 tablet (100 mg total) by mouth 2 (two) times daily. 11/21/20   Camnitz, Will Hassell Done, MD  fluticasone (FLONASE) 50 MCG/ACT nasal spray Place  2 sprays into both nostrils daily. 01/07/19   Dana Allan I, MD  fluticasone furoate-vilanterol (BREO ELLIPTA) 100-25 MCG/INH AEPB Inhale 1 puff into the lungs daily. 10/05/20   Parrett, Fonnie Mu, NP  gabapentin (NEURONTIN) 300 MG capsule TAKE 2 CAPSULES BY MOUTH AT BEDTIME 12/13/20   Isaac Bliss, [name], MD  glucose blood (ACCU-CHEK AVIVA PLUS) test strip 1 each by Other route daily. Dx E11.9 03/25/20   Isaac Bliss, [name], MD  Lactobacillus Rhamnosus, GG, (PROBIOTIC COLIC PO) Take 2 tablets by mouth daily. [place]    [provider]  metFORMIN (GLUCOPHAGE) 500 MG tablet TAKE 1 TABLET TWICE A DAY WITH MEALS 03/22/20   Isaac Bliss, [name], MD  montelukast (SINGULAIR) 10 MG tablet Take 10 mg by mouth at bedtime.     [provider]  Multiple Vitamins-Minerals (MULTIVITAMIN WITH MINERALS) tablet Take 2 tablets by mouth daily. Gummies    [provider]  potassium chloride SA (KLOR-CON M20) 20 MEQ tablet Take 1 tablet (20 mEq total) by mouth daily. 08/10/20   Richardo Priest, MD  RESTASIS 0.05 % ophthalmic emulsion Place 1 drop into both eyes 2 (two) times daily. 12/09/19   [provider]  saccharomyces boulardii (FLORASTOR) 250 MG capsule Take 1 capsule (250 mg total) by mouth 2 (two) times daily. 11/23/20   Isaac Bliss, [name], MD  sulfamethoxazole-trimethoprim (BACTRIM DS) 800-160 MG tablet Take 1 tablet by mouth 2 (two) times daily for 7 days. 12/27/20 01/03/21  [place]ique, Betty G, MD  torsemide (DEMADEX) 20 MG tablet Take 1 tablet (20 mg total) by mouth daily as needed (fluid retention). Patient taking differently: Take 20 mg by mouth  daily. 08/10/20   Richardo Priest, MD  valsartan (DIOVAN) 160 MG tablet Take 1 tablet (160 mg total) by mouth daily. 05/31/20   Richardo Priest, MD    Allergies    Patient has no known allergies.  Review of Systems   Review of Systems  All other systems reviewed and are negative.  Physical Exam Updated Vital Signs BP 130/60   Pulse 72   Temp 97.9 F (36.6 C)   Resp 17   SpO2 96%   Physical Exam Vitals and nursing note reviewed.  Constitutional:      General: He is not in acute distress.    Appearance: He is well-developed. He is obese.  HENT:     Head: Normocephalic and atraumatic.     Comments: No scalp tenderness no midface tenderness Eyes:     Conjunctiva/sclera: Conjunctivae normal.  Neck:     Comments: Cervical collar in place, no significant cervical midline spine tenderness. Cardiovascular:     Rate and Rhythm: Normal rate and regular rhythm.     Pulses: Normal pulses.     Heart sounds: Normal heart sounds.  Pulmonary:     Effort: Pulmonary effort is normal.     Breath sounds: Normal breath sounds. No wheezing, rhonchi or rales.  Abdominal:     Palpations: Abdomen is soft.     Tenderness: There is no abdominal tenderness.  Musculoskeletal:     Cervical back: Neck supple.     Right lower leg: No edema.     Left lower leg: No edema.     Comments: No midline spine tenderness Right arm: Mild tenderness about the shoulder with normal range of motion, small skin tear noted to the right posterior elbow as well as the dorsum of the hand with minimal tenderness  and full range of motion throughout.  Right middle finger is erythematous edematous and tender to palpation more significant to middle phalanx.  No nail involvement  4 out of 5 strength to bilateral lower extremities with intact pedal pulses and sensations intact.  Skin:    Findings: No rash.  Neurological:     Mental Status: He is alert. Mental status is at baseline.  Psychiatric:        Mood and Affect:  Mood normal.    ED Results / Procedures / Treatments   Labs (all labs ordered are listed, but only abnormal results are displayed) Labs Reviewed  RESP PANEL BY RT-PCR (FLU A&B, COVID) [id number]  URINALYSIS, ROUTINE W REFLEX MICROSCOPIC  BASIC METABOLIC PANEL  CBC WITH DIFFERENTIAL/PLATELET  CBG MONITORING, ED    EKG EKG Interpretation  Date/Time:  Friday December 30 2020 11:53:42 EDT Ventricular Rate:  73 PR Interval:    QRS Duration: 161 QT Interval:  454 QTC Calculation: 501 R Axis:   89 Text Interpretation: Accelerated junctional rhythm Nonspecific intraventricular conduction delay When compared to prior, wither longer 1st degree AV block vs accelerated junctional rhythm No STEMI Confirmed by Antony Blackbird [phone]) on 12/30/2020 11:57:44 AM  Radiology DG Ribs Unilateral W/Chest Right  Result Date: 12/30/2020 CLINICAL DATA:  Post fall earlier today now with right-sided rib pain. EXAM: RIGHT RIBS AND CHEST - 3+ VIEW COMPARISON:  Chest radiograph-01/05/2019 FINDINGS: Grossly unchanged borderline enlarged cardiac silhouette and mediastinal contours with atherosclerotic plaque within the thoracic aorta. No focal airspace opacities. No pleural effusion or pneumothorax. No evidence of edema. No definite displaced right-sided rib fractures. Regional soft tissues appear normal. No radiopaque foreign body. Stigmata of dish within the thoracic spine. Degenerative change of the lumbar spine is suspected though incompletely evaluated. Post cholecystectomy. Additional surgical clip overlies the lateral aspect of the right mid abdomen, potentially a displaced cholecystectomy clip. IMPRESSION: 1. Borderline cardiomegaly without superimposed acute cardiopulmonary disease. 2. No definite displaced right-sided rib fractures. Electronically Signed   By: Sandi Mariscal M.D.   On: 12/30/2020 14:13   CT Head Wo Contrast  Result Date: 12/30/2020 CLINICAL DATA:  Status post fall. EXAM: CT HEAD WITHOUT CONTRAST  CT CERVICAL SPINE WITHOUT CONTRAST TECHNIQUE: Multidetector CT imaging of the head and cervical spine was performed following the standard protocol without intravenous contrast. Multiplanar CT image reconstructions of the cervical spine were also generated. COMPARISON:  None FINDINGS: CT HEAD FINDINGS Brain: No evidence of acute infarction, hemorrhage, hydrocephalus, extra-axial collection or mass lesion/mass effect. There is marked prominence of the CSF spaces overlying the cerebral hemispheres which may reflect prominent brain atrophy and or chronic subdural hygromas, image 18/3. Vascular: No hyperdense vessel or unexpected calcification. Skull: Choose 1 Sinuses/Orbits: Near complete opacification of the right maxillary sinus and ethmoid air cells. Moderate mucosal thickening involves the left maxillary sinus. Air-fluid levels noted within the sphenoid sinus. Partial opacification of the frontal sinuses. Mastoid air cells appear clear. Other: None CT CERVICAL SPINE FINDINGS Alignment: Normal. Skull base and vertebrae: No acute fracture. No primary bone lesion or focal pathologic process. Soft tissues and spinal canal: No prevertebral fluid or swelling. No visible canal hematoma. Disc levels: Multilevel disc space narrowing and endplate spurring identified. This is most advanced at C5-6 and C6-7. Bilateral facet arthropathy noted. Upper chest: Negative. Other: None IMPRESSION: 1. No acute intracranial abnormalities. 2. Prominence of the CSF spaces overlying the cerebral hemispheres may reflect prominent brain atrophy and/or chronic subdural hygromas. 3. No evidence for cervical  spine fracture. 4. Cervical spondylosis. Electronically Signed   By: Kerby Moors M.D.   On: 12/30/2020 13:10   CT Cervical Spine Wo Contrast  Result Date: 12/30/2020 CLINICAL DATA:  Status post fall. EXAM: CT HEAD WITHOUT CONTRAST CT CERVICAL SPINE WITHOUT CONTRAST TECHNIQUE: Multidetector CT imaging of the head and cervical spine was  performed following the standard protocol without intravenous contrast. Multiplanar CT image reconstructions of the cervical spine were also generated. COMPARISON:  None FINDINGS: CT HEAD FINDINGS Brain: No evidence of acute infarction, hemorrhage, hydrocephalus, extra-axial collection or mass lesion/mass effect. There is marked prominence of the CSF spaces overlying the cerebral hemispheres which may reflect prominent brain atrophy and or chronic subdural hygromas, image 18/3. Vascular: No hyperdense vessel or unexpected calcification. Skull: Choose 1 Sinuses/Orbits: Near complete opacification of the right maxillary sinus and ethmoid air cells. Moderate mucosal thickening involves the left maxillary sinus. Air-fluid levels noted within the sphenoid sinus. Partial opacification of the frontal sinuses. Mastoid air cells appear clear. Other: None CT CERVICAL SPINE FINDINGS Alignment: Normal. Skull base and vertebrae: No acute fracture. No primary bone lesion or focal pathologic process. Soft tissues and spinal canal: No prevertebral fluid or swelling. No visible canal hematoma. Disc levels: Multilevel disc space narrowing and endplate spurring identified. This is most advanced at C5-6 and C6-7. Bilateral facet arthropathy noted. Upper chest: Negative. Other: None IMPRESSION: 1. No acute intracranial abnormalities. 2. Prominence of the CSF spaces overlying the cerebral hemispheres may reflect prominent brain atrophy and/or chronic subdural hygromas. 3. No evidence for cervical spine fracture. 4. Cervical spondylosis. Electronically Signed   By: Kerby Moors M.D.   On: 12/30/2020 13:10   DG Finger Middle Right  Result Date: 12/30/2020 CLINICAL DATA:  Right middle finger swelling after fall. EXAM: RIGHT MIDDLE FINGER 2+V COMPARISON:  None. FINDINGS: There is no evidence of fracture or dislocation. Mild degenerative changes seen involving the third distal interphalangeal joint. Mild diffuse soft tissue swelling is  noted. IMPRESSION: No fracture or dislocation is noted. 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 Mild soft tissue swelling is noted which may be posttraumatic in etiology. Electronically Signed   By: Marijo Conception M.D.   On: 12/30/2020 12:47    Procedures Procedures   Medications Ordered in ED Medications  Tdap (BOOSTRIX) injection 0.5 mL (has no administration in time range)  gadobutrol (GADAVIST) 1 MMOL/ML injection 9 mL (9 mLs Intravenous Contrast Given 12/30/20 1516)    ED Course  I have reviewed the triage vital signs and the nursing notes.  Pertinent labs & imaging results that were available during my care of the patient were reviewed by me and considered in my medical decision making (see chart for details).    MDM Rules/Calculators/A&P                           BP 130/60   Pulse 72   Temp 97.9 F (36.6 C)   Resp 17   SpO2 96%   Final Clinical Impression(s) / ED Diagnoses Final diagnoses:  None    Rx / DC Orders ED Discharge Orders     None      12:28 PM Patient here for recurrent falls, report having heaviness sensation to both legs each time he fell.  2 weeks ago he had atrial fibrillation requiring cardioversion while being compliant on Eliquis.  Symptom has been manifest since.  Will consult neurology for recommendations of imaging.  Work-up  initiated.  Care discussed with Dr. Sherry Ruffing.   12:49 PM Appreciate consultation to on-call neurologist in regards to appropriate imaging for this patient.  Since patient report of bilateral leg weakness and tingling sensation and when I have patient got up to ambulate he feels both legs are weak, neurologist recommend MRI with and without contrast to the cervical thoracic and lumbar spine.  We will also obtain CT scan of the head and C-spine since patient did fell and struck his head previously.  On reassessment patient also has some right ribs tenderness, will obtain x-ray of the ribs. I  am concerns for potential showering of clots after recent cardioversion causing his symptoms.  Appropriate imaging order after consulting neurology.   3:22 PM  Patient currently away for imaging which includes x-rays CT scan as well as MRIs.  Lab has not been obtained yet because patient has been away for imaging. Pt sign out to oncoming team who will call for admission once pt's and imaging have resulted.  Pt unable to ambulate.  I have request PT to eval.     Domenic Moras, PA-C 12/30/20 1525    Tegeler, Gwenyth Allegra, MD 12/31/20 1037

## 2020-12-30 NOTE — ED Triage Notes (Signed)
BIB [facility] after daughter in law called to report pt having fallen at home. Per EMS pt weakness in LE x 1 week. Pt also reports on and off again syncopal episodes. RUF skin tear and hand. Denies hitting head.   Hx: afib ( ablation done x 1 week), takes eliquis

## 2020-12-30 NOTE — ED Provider Notes (Signed)
Pt signed out by Dr. Sherry Ruffing and PA Rona Ravens pending MRIs and labs.  MRI:  IMPRESSION:  1. No evidence of metastatic disease.  No acute abnormality.  2. Multilevel cervical spondylosis as described above. Severe  bilateral neuroforaminal stenosis at C5-C6 and left neuroforaminal  stenosis at C6-C7.  3. 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  4. Transitional lumbosacral anatomy. Correlation with radiographs is  recommended prior to any operative intervention.  5. Trace bilateral pleural effusions.       Pt's labs are c/w AKI.  Pt's last Cr on 10/19 was 1.44.  Pt put on bactrim on 11/1 for a finger infection.  Pt given IVFs.    Pt d/w Dr. Roosevelt Locks (triad) for admission.   Isla Pence, MD 12/30/20 [phone]

## 2020-12-30 NOTE — ED Notes (Signed)
Pt remains in MRI 

## 2020-12-30 NOTE — ED Provider Notes (Signed)
  Physical Exam  BP 123/81 (BP Location: Left Arm)   Pulse 73   Temp 97.9 F (36.6 C)   Resp 15   SpO2 100%   Physical Exam  ED Course/Procedures     .Marland KitchenIncision and Drainage  Date/Time: 12/30/2020 6:56 PM Performed by: Isla Pence, MD Authorized by: Isla Pence, MD   Consent:    Consent obtained:  Verbal   Consent given by:  Patient   Risks discussed:  Bleeding, incomplete drainage and pain Universal protocol:    Patient identity confirmed:  Verbally with patient Location:    Type:  Abscess   Location:  Upper extremity   Upper extremity location:  Finger   Finger location:  R long finger Pre-procedure details:    Skin preparation:  Povidone-iodine Anesthesia:    Anesthesia method:  Nerve block   Block needle gauge:  27 G   Block anesthetic:  Bupivacaine 0.5% w/o epi   Block technique:  Digital   Block outcome:  Anesthesia achieved Procedure type:    Complexity:  Simple Procedure details:    Incision types:  Single straight   Drainage:  Purulent   Drainage amount:  Moderate   Packing materials:  None Post-procedure details:    Procedure completion:  Tolerated well, no immediate complications  MDM         Isla Pence, MD 12/30/20 1857

## 2020-12-30 NOTE — ED Notes (Signed)
Patient transported to Ultrasound 

## 2020-12-30 NOTE — ED Notes (Signed)
Unable to obtain vitals at this time, pt is still in MRI.

## 2020-12-30 NOTE — H&P (Addendum)
History and Physical    Charles Gum. LKG:[id number] DOB: [birth year] DOA: 12/30/2020  PCP: Charles Lloyd (Confirm with patient/family/NH records and if not entered, this has to be entered at [facility] point of entry) Patient coming from: Home  I have personally briefly reviewed patient's old medical records in [place]  Chief Complaint: My legs gave up  HPI: Charles Lloyd. is a 80 y.o. 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 fib status post cardioversion x3 and ablation x2, chronic diastolic CHF with LVEF 66% on stress test last year, HTN, IIDM, HLD, diabetic neuropathy, asthma/COPD, presented with frequent falls.  Baseline, patient has had diabetic neuropathy with chronic bilateral lower extremity numbness below the knee, however he never had ambulation problems until this morning.  Patient was able to ambulate without problem and played a full session of golf yesterday.  However this morning, patient woke up as usual, and he was able to walk down the stairs to the kitchen but then suddenly, " someone took my legs away" and he fell for the first time.  No loss of consciousness, no lightheadedness, no head injury.  But repeatedly he tried to stand up and then fell again, and totally he fell 4 times this morning.  He said the leg and feet numbness remains about the same, urine or bowel movement problem no saddle area numbness.  4 days ago, he developed a rash of the right mid fingertip, and went to see PCP who diagnosed him with right finger cellulitis and started him on Bactrim twice daily which he has been taking for the last 3 days, despite, he noticed there is a past develop at the fingertip today.  He denies any problem moving the right middle finger, no fever or chills at home.  And he did not notice any urinary changes, no decrease of urine output or color changes.  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  And foraminal stenosis C5-C6.  Blood work showed AKI with creatinine 2.6 compared to baseline 1.4 several months ago.  Physical therapy try to walk the patient in the ED however patient unable to stand on his feet.  Review of Systems: As per HPI otherwise 14 point review of systems negative.    Past Medical History:  Diagnosis Date   Acute tracheobronchitis 01/05/2019   B12 deficiency    Basal cell carcinoma (BCC) of left side of nose 01/28/2018   Cancer ([place])    Cardiomyopathy ([place]) 03/10/2018   With chronic atrial fibrillation   CHF (congestive heart failure) ([place]) 04/16/2013   Functional class II, ejection fraction 35-40%  Formatting of this note might be different from the original. Functional class II, ejection fraction 35-40%   Chronic anticoagulation    Chronic diastolic (congestive) heart failure ([place]) 04/16/2013   Functional class II, ejection fraction 35-40%  Last Assessment & Plan:  Clinically stable Volume well controlled meds reviewed   Chronic diastolic CHF (congestive heart failure) ([place]) 04/16/2013   Functional class II, ejection fraction 35-40%  Last Assessment & Plan:  Clinically stable Volume well controlled meds reviewed   Colon polyps 04/16/2013   Diabetic neuropathy (HCC)    Eosinophil count raised 02/17/2019   Essential hypertension 01/06/2010   Last Assessment & Plan:  Well controlled Continue med management  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  Steroid usage was discontinued several months ago.  Follow up with Dermatology as planned.   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   Last Assessment & Plan:  Id following ?ongoing abx managementy reported Request records   Infection of prosthetic right knee joint ([place])    Insomnia 03/04/2012   Grief with loss of wife 02/20/12  Last Assessment & Plan:  Improved sx  contineu xanax prn Se discussed   Lesion of liver 04/20/2019   -On cardiac CT 02/2019   Mild reactive airways disease    Neoplasm of prostate 04/16/2013   Neoplasm of prostate, 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 Transthoracic Echocardiogram-03/10/2013-[facility]: Normal left ventricular wall thickness and cavity size.  Global left ventricular systolic function is moderately reduced.  The estimated ejection fraction is 35-40%.  The left atrium is moderately enlarged.  The right atrium is mildly enlarged.  No significant valvular    Overweight (BMI 25.0-29.9) 10/22/2016   Persistent atrial fibrillation ([place]) 04/16/2013   Last Assessment & Plan:  Rate controlled Continue med management eliquis for stroke preventino  Formatting of this note might be different from the original.  Drug  HX Current Rx Pre-ABL inefficacy Pre-ABL intolerant Post-ABL inefficacy Post-ABL intolerant max dose/24h M/Y end comments  sotalol                  dofetilide                  flecainide                  propafenone                  am   S/P TKR (total knee replacement), bilateral    Secondary hypercoagulable state ([place]) 04/09/2019   Tinea cruris 04/16/2013   Type 2 diabetes mellitus with diabetic polyneuropathy, without long-term current use of  insulin ([place]) 04/16/2013   Last Assessment & Plan:  Labs today Pt reports well controlled on ambulatory monitoring   Vitamin D deficiency 07/25/2018    Past Surgical History:  Procedure Laterality Date   ATRIAL FIBRILLATION ABLATION N/A 03/12/2019   Procedure: ATRIAL FIBRILLATION ABLATION;  Surgeon: [name], Lloyd;  Location: [place] CV LAB;  Service: Cardiovascular;  Laterality: N/A;   ATRIAL FIBRILLATION ABLATION N/A 04/29/2020   Procedure: ATRIAL FIBRILLATION ABLATION;  Surgeon: [name], Lloyd;  Location: [place] CV LAB;  Service: Cardiovascular;  Laterality: N/A;   CARDIAC ELECTROPHYSIOLOGY STUDY AND ABLATION     CARDIOVERSION     CARDIOVERSION N/A 10/10/2018   Procedure: CARDIOVERSION;  Surgeon: [name], Lloyd;  Location: [facility] ENDOSCOPY;  Service: Cardiovascular;  Laterality: N/A;   CARDIOVERSION N/A 05/18/2020   Procedure: CARDIOVERSION;  Surgeon: [name], Lloyd;  Location: [place] ENDOSCOPY;  Service: Cardiovascular;  Laterality: N/A;   CARDIOVERSION N/A 12/09/2020   Procedure: CARDIOVERSION;  Surgeon: Lelon Perla, Lloyd;  Location: [facility] ENDOSCOPY;  Service: Cardiovascular;  Laterality: N/A;   CHOLECYSTECTOMY     PROSTATECTOMY     REPLACEMENT TOTAL KNEE BILATERAL       reports that he has never smoked. He has never used smokeless tobacco. He reports current alcohol use of about 4.0 standard drinks per week. He reports that he does not use drugs.  No Known Allergies  Family History  Problem Relation Age of Onset   Cancer Mother    Depression Mother    Early death Mother    Cancer Father    Depression Father    Early death Father      Prior to Admission medications   Medication Sig Start Date End Date Taking? Authorizing Provider  albuterol (VENTOLIN HFA) 108 (90 Base) MCG/ACT inhaler Inhale 1-2 puffs into the lungs every 6 (six) hours as needed for wheezing or shortness of breath. 10/05/20   Parrett, Fonnie Mu, NP  amLODipine (NORVASC) 5 MG  tablet Take 1 tablet (5 mg total) by mouth daily. 06/10/20 12/07/20  Richardo Priest, Lloyd  amoxicillin (AMOXIL) 500 MG capsule Take 2,000 mg by mouth once as needed (prior to dental procedure.). 01/20/20   Provider, Historical, Lloyd  apixaban (ELIQUIS) 5 MG TABS tablet Take 1 tablet (5 mg total) by mouth 2 (two) times daily. 12/15/20   Charles Lloyd  atorvastatin (LIPITOR) 20 MG tablet TAKE 1 TABLET DAILY AT 6 P.M. Patient taking differently: Take 20 mg by mouth every evening. 03/22/20   Charles Lloyd  carvedilol (COREG) 12.5 MG tablet Take 1 tablet (12.5 mg total) by mouth 2 (two) times daily. 06/16/20 03/04/21  Tobb, Kardie, DO  flecainide (TAMBOCOR) 100 MG tablet Take 1 tablet (100 mg total) by mouth 2 (two) times daily. 11/21/20   Camnitz, Will Hassell Done, Lloyd  fluticasone (FLONASE) 50 MCG/ACT nasal spray Place 2 sprays into both nostrils daily. 01/07/19   Dana Allan I, Lloyd  fluticasone furoate-vilanterol (BREO ELLIPTA) 100-25 MCG/INH AEPB Inhale 1 puff into the lungs daily. 10/05/20   Parrett, Fonnie Mu, NP  gabapentin (NEURONTIN) 300 MG capsule TAKE 2 CAPSULES BY MOUTH AT BEDTIME Patient taking differently: Take 600 mg by mouth at bedtime. 12/13/20   Charles Lloyd  glucose blood (ACCU-CHEK AVIVA PLUS) test strip 1 each by Other route daily. Dx E11.9 03/25/20   Charles Lloyd  Lactobacillus Rhamnosus, GG, (PROBIOTIC COLIC PO) Take 2 tablets by mouth daily. [place]    Provider, Historical, Lloyd  metFORMIN (GLUCOPHAGE) 500 MG tablet TAKE 1 TABLET TWICE A DAY WITH MEALS Patient taking differently: Take 500 mg by mouth 2 (two) times daily with a meal. 03/22/20   Charles Lloyd  montelukast (SINGULAIR) 10 MG tablet Take 10 mg by mouth at bedtime.     Provider, Historical, Lloyd  Multiple Vitamins-Minerals (MULTIVITAMIN WITH MINERALS) tablet Take 2 tablets by mouth daily. Gummies    Provider, Historical, Lloyd  potassium  chloride SA (KLOR-CON M20) 20 MEQ tablet Take 1 tablet (20 mEq total) by mouth daily. 08/10/20   Richardo Priest, Lloyd  RESTASIS 0.05 % ophthalmic emulsion Place 1 drop into both eyes 2 (two) times daily. 12/09/19   Provider, Historical, Lloyd  saccharomyces boulardii (FLORASTOR) 250 MG capsule Take 1 capsule (250 mg total) by mouth 2 (two) times daily.  11/23/20   Charles Lloyd  sulfamethoxazole-trimethoprim (BACTRIM DS) 800-160 MG tablet Take 1 tablet by mouth 2 (two) times daily for 7 days. 12/27/20 01/03/21  [place]ique, Betty G, Lloyd  torsemide (DEMADEX) 20 MG tablet Take 1 tablet (20 mg total) by mouth daily as needed (fluid retention). Patient taking differently: Take 20 mg by mouth daily. 08/10/20   Richardo Priest, Lloyd  valsartan (DIOVAN) 160 MG tablet Take 1 tablet (160 mg total) by mouth daily. 05/31/20   Richardo Priest, Lloyd    Physical Exam: Vitals:   12/30/20 1136 12/30/20 1200 12/30/20 1215 12/30/20 1621  BP:  (!) 117/59 130/60 123/81  Pulse:  73 72 73  Resp:  16 17 15   Temp: 97.9 F (36.6 C)     SpO2:  95% 96% 100%    Constitutional: NAD, calm, comfortable Vitals:   12/30/20 1136 12/30/20 1200 12/30/20 1215 12/30/20 1621  BP:  (!) 117/59 130/60 123/81  Pulse:  73 72 73  Resp:  16 17 15   Temp: 97.9 F (36.6 C)     SpO2:  95% 96% 100%   Eyes: PERRL, lids and conjunctivae normal ENMT: Mucous membranes are moist. Posterior pharynx clear of any exudate or lesions.Normal dentition.  Neck: normal, supple, no masses, no thyromegaly Respiratory: clear to auscultation bilaterally, no wheezing, no crackles. Normal respiratory effort. No accessory muscle use.  Cardiovascular: Regular rate and rhythm, no murmurs / rubs / gallops. No extremity edema. 2+ pedal pulses. No carotid bruits.  Abdomen: no tenderness, no masses palpated. No hepatosplenomegaly. Bowel sounds positive.  Musculoskeletal: no clubbing / cyanosis. No joint deformity upper and lower extremities. Good ROM, no  contractures. Normal muscle tone.  Skin: Right middle finger swelling and rash around PIP, with a small pus collection with tenderness and induration.  ROM not limited. Neurologic: CN 2-12 grossly intact. Sensation managed on bilateral lower extremities below the knee, DTR normal. Strength 4/5 bilateral lower extremities.  Psychiatric: Normal judgment and insight. Alert and oriented x 3. Normal mood.    Labs on Admission: I have personally reviewed following labs and imaging studies  CBC: Recent Labs  Lab 12/30/20 1613  WBC 6.4  NEUTROABS 4.7  HGB 10.1*  HCT 29.5*  MCV 88.6  PLT 841*   Basic Metabolic Panel: Recent Labs  Lab 12/30/20 1613  NA 130*  K 3.7  CL 96*  CO2 23  GLUCOSE 101*  BUN 25*  CREATININE 2.60*  CALCIUM 8.9   GFR: Estimated Creatinine Clearance: 24.6 mL/min (A) (by C-G formula based on SCr of 2.6 mg/dL (H)). Liver Function Tests: No results for input(s): AST, ALT, ALKPHOS, BILITOT, PROT, ALBUMIN in the last 168 hours. No results for input(s): LIPASE, AMYLASE in the last 168 hours. No results for input(s): AMMONIA in the last 168 hours. Coagulation Profile: No results for input(s): INR, PROTIME in the last 168 hours. Cardiac Enzymes: No results for input(s): CKTOTAL, CKMB, CKMBINDEX, TROPONINI in the last 168 hours. BNP (last 3 results) Recent Labs    02/18/20 1014 03/09/20 0936 06/10/20 1049  PROBNP 2,036* 2,820* 2,486*   HbA1C: No results for input(s): HGBA1C in the last 72 hours. CBG: No results for input(s): GLUCAP in the last 168 hours. Lipid Profile: No results for input(s): CHOL, HDL, LDLCALC, TRIG, CHOLHDL, LDLDIRECT in the last 72 hours. Thyroid Function Tests: No results for input(s): TSH, T4TOTAL, FREET4, T3FREE, THYROIDAB in the last 72 hours. Anemia Panel: No results for input(s): VITAMINB12, FOLATE, FERRITIN, TIBC,  IRON, RETICCTPCT in the last 72 hours. Urine analysis:    Component Value Date/Time   COLORURINE YELLOW  01/05/2019 [place] 01/05/2019 1643   LABSPEC 1.008 01/05/2019 1643   PHURINE 7.0 01/05/2019 1643   GLUCOSEU NEGATIVE 01/05/2019 1643   HGBUR NEGATIVE 01/05/2019 [place] 01/05/2019 [place] 01/05/2019 1643   PROTEINUR NEGATIVE 01/05/2019 1643   NITRITE NEGATIVE 01/05/2019 [place] 01/05/2019 1643    Radiological Exams on Admission: DG Ribs Unilateral W/Chest Right  Result Date: 12/30/2020 CLINICAL DATA:  Post fall earlier today now with right-sided rib pain. EXAM: RIGHT RIBS AND CHEST - 3+ VIEW COMPARISON:  Chest radiograph-01/05/2019 FINDINGS: Grossly unchanged borderline enlarged cardiac silhouette and mediastinal contours with atherosclerotic plaque within the thoracic aorta. No focal airspace opacities. No pleural effusion or pneumothorax. No evidence of edema. No definite displaced right-sided rib fractures. Regional soft tissues appear normal. No radiopaque foreign body. Stigmata of dish within the thoracic spine. Degenerative change of the lumbar spine is suspected though incompletely evaluated. Post cholecystectomy. Additional surgical clip overlies the lateral aspect of the right mid abdomen, potentially a displaced cholecystectomy clip. IMPRESSION: 1. Borderline cardiomegaly without superimposed acute cardiopulmonary disease. 2. No definite displaced right-sided rib fractures. Electronically Signed   By: Sandi Mariscal M.D.   On: 12/30/2020 14:13   CT Head Wo Contrast  Result Date: 12/30/2020 CLINICAL DATA:  Status post fall. EXAM: CT HEAD WITHOUT CONTRAST CT CERVICAL SPINE WITHOUT CONTRAST TECHNIQUE: Multidetector CT imaging of the head and cervical spine was performed following the standard protocol without intravenous contrast. Multiplanar CT image reconstructions of the cervical spine were also generated. COMPARISON:  None FINDINGS: CT HEAD FINDINGS Brain: No evidence of acute infarction, hemorrhage,  hydrocephalus, extra-axial collection or mass lesion/mass effect. There is marked prominence of the CSF spaces overlying the cerebral hemispheres which may reflect prominent brain atrophy and or chronic subdural hygromas, image 18/3. Vascular: No hyperdense vessel or unexpected calcification. Skull: Choose 1 Sinuses/Orbits: Near complete opacification of the right maxillary sinus and ethmoid air cells. Moderate mucosal thickening involves the left maxillary sinus. Air-fluid levels noted within the sphenoid sinus. Partial opacification of the frontal sinuses. Mastoid air cells appear clear. Other: None CT CERVICAL SPINE FINDINGS Alignment: Normal. Skull base and vertebrae: No acute fracture. No primary bone lesion or focal pathologic process. Soft tissues and spinal canal: No prevertebral fluid or swelling. No visible canal hematoma. Disc levels: Multilevel disc space narrowing and endplate spurring identified. This is most advanced at C5-6 and C6-7. Bilateral facet arthropathy noted. Upper chest: Negative. Other: None IMPRESSION: 1. No acute intracranial abnormalities. 2. Prominence of the CSF spaces overlying the cerebral hemispheres may reflect prominent brain atrophy and/or chronic subdural hygromas. 3. No evidence for cervical spine fracture. 4. Cervical spondylosis. Electronically Signed   By: Kerby Moors M.D.   On: 12/30/2020 13:10   CT Cervical Spine Wo Contrast  Result Date: 12/30/2020 CLINICAL DATA:  Status post fall. EXAM: CT HEAD WITHOUT CONTRAST CT CERVICAL SPINE WITHOUT CONTRAST TECHNIQUE: Multidetector CT imaging of the head and cervical spine was performed following the standard protocol without intravenous contrast. Multiplanar CT image reconstructions of the cervical spine were also generated. COMPARISON:  None FINDINGS: CT HEAD FINDINGS Brain: No evidence of acute infarction, hemorrhage, hydrocephalus, extra-axial collection or mass lesion/mass effect. There is marked prominence of the CSF  spaces overlying the cerebral hemispheres which may reflect prominent brain atrophy and or chronic subdural  hygromas, image 18/3. Vascular: No hyperdense vessel or unexpected calcification. Skull: Choose 1 Sinuses/Orbits: Near complete opacification of the right maxillary sinus and ethmoid air cells. Moderate mucosal thickening involves the left maxillary sinus. Air-fluid levels noted within the sphenoid sinus. Partial opacification of the frontal sinuses. Mastoid air cells appear clear. Other: None CT CERVICAL SPINE FINDINGS Alignment: Normal. Skull base and vertebrae: No acute fracture. No primary bone lesion or focal pathologic process. Soft tissues and spinal canal: No prevertebral fluid or swelling. No visible canal hematoma. Disc levels: Multilevel disc space narrowing and endplate spurring identified. This is most advanced at C5-6 and C6-7. Bilateral facet arthropathy noted. Upper chest: Negative. Other: None IMPRESSION: 1. No acute intracranial abnormalities. 2. Prominence of the CSF spaces overlying the cerebral hemispheres may reflect prominent brain atrophy and/or chronic subdural hygromas. 3. No evidence for cervical spine fracture. 4. Cervical spondylosis. Electronically Signed   By: Kerby Moors M.D.   On: 12/30/2020 13:10   MR Cervical Spine W or Wo Contrast  Result Date: 12/30/2020 CLINICAL DATA:  Lower extremity weakness for 1 week. History of prostate cancer. EXAM: MRI CERVICAL, THORACIC AND LUMBAR SPINE WITHOUT AND WITH CONTRAST TECHNIQUE: Multiplanar and multiecho pulse sequences of the cervical spine, to include the craniocervical junction and cervicothoracic junction, and thoracic and lumbar spine, were obtained without and with intravenous contrast. CONTRAST:  63mL GADAVIST GADOBUTROL 1 MMOL/ML IV SOLN COMPARISON:  CT cervical spine dated December 30, 2020. FINDINGS: MRI CERVICAL SPINE FINDINGS Alignment: 3 mm anterolisthesis at C7-T1. Vertebrae: No fracture, evidence of discitis, or bone  lesion. Cord: Normal signal and morphology.  No intradural enhancement. Posterior Fossa, vertebral arteries, paraspinal tissues: Negative. Disc levels: C2-C3: Partial interbody and posterior element ankylosis. No stenosis. C3-C4: No significant disc bulge or herniation. Posterior element ankylosis and spurring. Mild bilateral neuroforaminal stenosis. No spinal canal stenosis. C4-C5: No significant disc bulge or herniation. Moderate left greater than right facet uncovertebral hypertrophy. Moderate left and mild right neuroforaminal stenosis. No spinal canal stenosis. C5-C6: Circumferential disc osteophyte complex. Severe bilateral uncovertebral hypertrophy. Mild bilateral facet arthropathy. Mild spinal canal stenosis. Severe bilateral neuroforaminal stenosis. C6-C7: Circumferential disc osteophyte complex. Severe bilateral uncovertebral hypertrophy. Mild bilateral facet arthropathy. Mild spinal canal stenosis. Severe left and mild-to-moderate right neuroforaminal stenosis. C7-T1: Negative disc. Severe bilateral facet arthropathy. No stenosis. MRI THORACIC SPINE FINDINGS Alignment:  Trace anterolisthesis at T1-T2, T2-T3, and T4-T5. Vertebrae: No fracture, evidence of discitis, or bone lesion. Cord:  Normal signal and morphology.  No intradural enhancement. Paraspinal and other soft tissues: Trace bilateral pleural effusions. Disc levels: Scattered small disc protrusions. No spinal canal or neuroforaminal stenosis at any level. 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 Alignment: Trace retrolisthesis at L2-L3 and L5-S1. 4 mm anterolisthesis at L4-L5. Vertebrae: No fracture, evidence of discitis, or bone lesion. Asymmetric right-sided degenerative endplate marrow edema at L2-L3. Conus medullaris and cauda equina: Conus extends to the L1-L2 level. Conus and cauda equina appear normal. No intradural enhancement. Paraspinal and other soft tissues: Negative.  Disc levels: T12-L1:  Negative. L1-L2: Small circumferential disc osteophyte complex and moderate bilateral facet arthropathy. No stenosis. L2-L3: Moderate circumferential disc osteophyte complex. Moderate bilateral facet arthropathy with ligamentum flavum hypertrophy. Severe spinal canal stenosis. Moderate left and mild right neuroforaminal stenosis. L3-L4: Mild disc bulging and endplate spurring asymmetric to the left. Moderate bilateral facet arthropathy with ligamentum flavum hypertrophy. Mild spinal canal and bilateral neuroforaminal stenosis. L4-L5: Disc uncovering and mild disc bulging. Severe  bilateral facet arthropathy with ligamentum flavum hypertrophy. Severe spinal canal stenosis. Mild bilateral neuroforaminal stenosis. L5-S1: Small circumferential disc osteophyte complex eccentric to the right. Moderate bilateral facet arthropathy. Mild right neuroforaminal stenosis. No spinal canal or left neuroforaminal stenosis. S1-S2: Rudimentary disc.  No stenosis. IMPRESSION: 1. No evidence of metastatic disease.  No acute abnormality. 2. Multilevel cervical spondylosis as described above. Severe bilateral neuroforaminal stenosis at C5-C6 and left neuroforaminal stenosis at C6-C7. 3. 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 4. Transitional lumbosacral anatomy. Correlation with radiographs is recommended prior to any operative intervention. 5. Trace bilateral pleural effusions. Electronically Signed   By: Titus Dubin M.D.   On: 12/30/2020 16:05   MR THORACIC SPINE W WO CONTRAST  Result Date: 12/30/2020 CLINICAL DATA:  Lower extremity weakness for 1 week. History of prostate cancer. EXAM: MRI CERVICAL, THORACIC AND LUMBAR SPINE WITHOUT AND WITH CONTRAST TECHNIQUE: Multiplanar and multiecho pulse sequences of the cervical spine, to include the craniocervical junction and cervicothoracic junction, and thoracic and lumbar spine, were obtained without and with intravenous contrast. CONTRAST:  30mL  GADAVIST GADOBUTROL 1 MMOL/ML IV SOLN COMPARISON:  CT cervical spine dated December 30, 2020. FINDINGS: MRI CERVICAL SPINE FINDINGS Alignment: 3 mm anterolisthesis at C7-T1. Vertebrae: No fracture, evidence of discitis, or bone lesion. Cord: Normal signal and morphology.  No intradural enhancement. Posterior Fossa, vertebral arteries, paraspinal tissues: Negative. Disc levels: C2-C3: Partial interbody and posterior element ankylosis. No stenosis. C3-C4: No significant disc bulge or herniation. Posterior element ankylosis and spurring. Mild bilateral neuroforaminal stenosis. No spinal canal stenosis. C4-C5: No significant disc bulge or herniation. Moderate left greater than right facet uncovertebral hypertrophy. Moderate left and mild right neuroforaminal stenosis. No spinal canal stenosis. C5-C6: Circumferential disc osteophyte complex. Severe bilateral uncovertebral hypertrophy. Mild bilateral facet arthropathy. Mild spinal canal stenosis. Severe bilateral neuroforaminal stenosis. C6-C7: Circumferential disc osteophyte complex. Severe bilateral uncovertebral hypertrophy. Mild bilateral facet arthropathy. Mild spinal canal stenosis. Severe left and mild-to-moderate right neuroforaminal stenosis. C7-T1: Negative disc. Severe bilateral facet arthropathy. No stenosis. MRI THORACIC SPINE FINDINGS Alignment:  Trace anterolisthesis at T1-T2, T2-T3, and T4-T5. Vertebrae: No fracture, evidence of discitis, or bone lesion. Cord:  Normal signal and morphology.  No intradural enhancement. Paraspinal and other soft tissues: Trace bilateral pleural effusions. Disc levels: Scattered small disc protrusions. No spinal canal or neuroforaminal stenosis at any level. 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 Alignment: Trace retrolisthesis at L2-L3 and L5-S1. 4 mm anterolisthesis at L4-L5. Vertebrae: No fracture, evidence of discitis, or bone lesion. Asymmetric  right-sided degenerative endplate marrow edema at L2-L3. Conus medullaris and cauda equina: Conus extends to the L1-L2 level. Conus and cauda equina appear normal. No intradural enhancement. Paraspinal and other soft tissues: Negative. Disc levels: T12-L1:  Negative. L1-L2: Small circumferential disc osteophyte complex and moderate bilateral facet arthropathy. No stenosis. L2-L3: Moderate circumferential disc osteophyte complex. Moderate bilateral facet arthropathy with ligamentum flavum hypertrophy. Severe spinal canal stenosis. Moderate left and mild right neuroforaminal stenosis. L3-L4: Mild disc bulging and endplate spurring asymmetric to the left. Moderate bilateral facet arthropathy with ligamentum flavum hypertrophy. Mild spinal canal and bilateral neuroforaminal stenosis. L4-L5: Disc uncovering and mild disc bulging. Severe bilateral facet arthropathy with ligamentum flavum hypertrophy. Severe spinal canal stenosis. Mild bilateral neuroforaminal stenosis. L5-S1: Small circumferential disc osteophyte complex eccentric to the right. Moderate bilateral facet arthropathy. Mild right neuroforaminal stenosis. No spinal canal or left neuroforaminal stenosis. S1-S2: Rudimentary disc.  No stenosis. IMPRESSION: 1.  No evidence of metastatic disease.  No acute abnormality. 2. Multilevel cervical spondylosis as described above. Severe bilateral neuroforaminal stenosis at C5-C6 and left neuroforaminal stenosis at C6-C7. 3. 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 4. Transitional lumbosacral anatomy. Correlation with radiographs is recommended prior to any operative intervention. 5. Trace bilateral pleural effusions. Electronically Signed   By: Titus Dubin M.D.   On: 12/30/2020 16:05   MR Lumbar Spine W Wo Contrast  Result Date: 12/30/2020 CLINICAL DATA:  Lower extremity weakness for 1 week. History of prostate cancer. EXAM: MRI CERVICAL, THORACIC AND LUMBAR SPINE WITHOUT AND WITH CONTRAST TECHNIQUE:  Multiplanar and multiecho pulse sequences of the cervical spine, to include the craniocervical junction and cervicothoracic junction, and thoracic and lumbar spine, were obtained without and with intravenous contrast. CONTRAST:  24mL GADAVIST GADOBUTROL 1 MMOL/ML IV SOLN COMPARISON:  CT cervical spine dated December 30, 2020. FINDINGS: MRI CERVICAL SPINE FINDINGS Alignment: 3 mm anterolisthesis at C7-T1. Vertebrae: No fracture, evidence of discitis, or bone lesion. Cord: Normal signal and morphology.  No intradural enhancement. Posterior Fossa, vertebral arteries, paraspinal tissues: Negative. Disc levels: C2-C3: Partial interbody and posterior element ankylosis. No stenosis. C3-C4: No significant disc bulge or herniation. Posterior element ankylosis and spurring. Mild bilateral neuroforaminal stenosis. No spinal canal stenosis. C4-C5: No significant disc bulge or herniation. Moderate left greater than right facet uncovertebral hypertrophy. Moderate left and mild right neuroforaminal stenosis. No spinal canal stenosis. C5-C6: Circumferential disc osteophyte complex. Severe bilateral uncovertebral hypertrophy. Mild bilateral facet arthropathy. Mild spinal canal stenosis. Severe bilateral neuroforaminal stenosis. C6-C7: Circumferential disc osteophyte complex. Severe bilateral uncovertebral hypertrophy. Mild bilateral facet arthropathy. Mild spinal canal stenosis. Severe left and mild-to-moderate right neuroforaminal stenosis. C7-T1: Negative disc. Severe bilateral facet arthropathy. No stenosis. MRI THORACIC SPINE FINDINGS Alignment:  Trace anterolisthesis at T1-T2, T2-T3, and T4-T5. Vertebrae: No fracture, evidence of discitis, or bone lesion. Cord:  Normal signal and morphology.  No intradural enhancement. Paraspinal and other soft tissues: Trace bilateral pleural effusions. Disc levels: Scattered small disc protrusions. No spinal canal or neuroforaminal stenosis at any level. MRI LUMBAR SPINE FINDINGS Segmentation:  Transitional lumbosacral anatomy with partial lumbarization of S1. Alignment: Trace retrolisthesis at L2-L3 and L5-S1. 4 mm anterolisthesis at L4-L5. Vertebrae: No fracture, evidence of discitis, or bone lesion. Asymmetric right-sided degenerative endplate marrow edema at L2-L3. Conus medullaris and cauda equina: Conus extends to the L1-L2 level. Conus and cauda equina appear normal. No intradural enhancement. Paraspinal and other soft tissues: Negative. Disc levels: T12-L1:  Negative. L1-L2: Small circumferential disc osteophyte complex and moderate bilateral facet arthropathy. No stenosis. L2-L3: Moderate circumferential disc osteophyte complex. Moderate bilateral facet arthropathy with ligamentum flavum hypertrophy. Severe spinal canal stenosis. Moderate left and mild right neuroforaminal stenosis. L3-L4: Mild disc bulging and endplate spurring asymmetric to the left. Moderate bilateral facet arthropathy with ligamentum flavum hypertrophy. Mild spinal canal and bilateral neuroforaminal stenosis. L4-L5: Disc uncovering and mild disc bulging. Severe bilateral facet arthropathy with ligamentum flavum hypertrophy. Severe spinal canal stenosis. Mild bilateral neuroforaminal stenosis. L5-S1: Small circumferential disc osteophyte complex eccentric to the right. Moderate bilateral facet arthropathy. Mild right neuroforaminal stenosis. No spinal canal or left neuroforaminal stenosis. S1-S2: Rudimentary disc.  No stenosis. IMPRESSION: 1. No evidence of metastatic disease.  No acute abnormality. 2. Multilevel cervical spondylosis as described above. Severe bilateral neuroforaminal stenosis at C5-C6 and left neuroforaminal stenosis at C6-C7. 3. 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 4. Transitional lumbosacral anatomy. Correlation with radiographs is recommended prior to  any operative intervention. 5. Trace bilateral pleural effusions. Electronically Signed   By: Titus Dubin M.D.   On: 12/30/2020 16:05   DG  Finger Middle Right  Result Date: 12/30/2020 CLINICAL DATA:  Right middle finger swelling after fall. EXAM: RIGHT MIDDLE FINGER 2+V COMPARISON:  None. FINDINGS: There is no evidence of fracture or dislocation. Mild degenerative changes seen involving the third distal interphalangeal joint. Mild diffuse soft tissue swelling is noted. IMPRESSION: No fracture or dislocation is noted. 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 Mild soft tissue swelling is noted which may be posttraumatic in etiology. Electronically Signed   By: Marijo Conception M.D.   On: 12/30/2020 12:47    EKG: Independently reviewed.  Sinus, no acute ST-T changes.  No PR or QTC changes.  Assessment/Plan Active Problems:   AKI (acute kidney injury) ([place])  (please populate well all problems here in Problem List. (For example, if patient is on BP meds at home and you resume or decide to hold them, it is a problem that needs to be her. Same for CAD, COPD, HLD and so on)  Acute ambulation dysfunction -His symptoms and clinical presentation not in accordance with physical exam.  Symptoms are sudden onset and severe, however physical exam showed muscle strength very much preserved on bilateral legs.  Although he has a baseline neuropathy which appears only affect light touch sensation but not motor until this morning.  MRI significant for spinal stenosis 2 levels of L2-L3, and L4-L5, which given the benign neuro exam, I suspect the MRI finding more likely chronic rather than acute.  Neurology on board, will come to see the patient and review MRI finding and gave further recommendations. -I recommend patient go see [facility] to discuss about conservative management such as steroid injections, patient expressed understanding and agreed. -Other DDx, medication related given patient has AKI, some of his neuro medications may accumulate and cause neuronal side effect, will cut down gabapentin, and  flecainide. -PT evaluation.  AKI -Given the fact that this happened acutely after patient had 3 days treatment of Bactrim for the finger infection, suspect the AKI is medication related, hopefully not ATN. -Discontinue Bactrim -IV fluid -Renal ultrasound -Adjust medications, hold diuresis, discontinue ARB, re-dose flecainide.  Hold metformin.  Hyponatremia -Euvolemic, likely related to the AKI. -IV fluid to correct AKI then reevaluate.  Right mid finger cellulitis and abscess formation -Discussed with ED physician, will plan for I&D at bedside. -Switch antibiotics to doxycycline due to AKI.  PAF -Sinus rhythm -Decreased flecainide dose due to AKI -Continue Eliquis at 5 mg twice daily.  HTN -Continue amlodipine and beta-blocker, hold ARB, add as needed hydralazine.   IIDM -Sliding scale for now.  Diabetic neuropathy, agent orange neuropathy? -Cut down gabapentin dosage.  Chronic diastolic CHF -Hold Lasix for now for AKI.  COPD -Stable, continue home breathing meds.  DVT prophylaxis: Eliquis Code Status: Full code Family Communication: None at bedside Disposition Plan: Expect more than 2 midnight hospital stay to treat AKI and allow time for physical therapy, expect discharge to SNF Consults called: Neurology Admission status: MedSurg admission   Lequita Halt Lloyd [facility] Pager [phone]  12/30/2020, 7:20 PM

## 2020-12-31 DIAGNOSIS — I10 Essential (primary) hypertension: Secondary | ICD-10-CM

## 2020-12-31 DIAGNOSIS — E119 Type 2 diabetes mellitus without complications: Secondary | ICD-10-CM

## 2020-12-31 DIAGNOSIS — N179 Acute kidney failure, unspecified: Principal | ICD-10-CM

## 2020-12-31 LAB — BASIC METABOLIC PANEL
Anion gap: 7 (ref 5–15)
BUN: 20 mg/dL (ref 8–23)
CO2: 22 mmol/L (ref 22–32)
Calcium: 8.3 mg/dL — ABNORMAL LOW (ref 8.9–10.3)
Chloride: 102 mmol/L (ref 98–111)
Creatinine, Ser: 1.98 mg/dL — ABNORMAL HIGH (ref 0.61–1.24)
GFR, Estimated: 34 mL/min — ABNORMAL LOW (ref >60)
Glucose, Bld: 104 mg/dL — ABNORMAL HIGH (ref 70–99)
Potassium: 3.5 mmol/L (ref 3.5–5.1)
Sodium: 131 mmol/L — ABNORMAL LOW (ref 135–145)

## 2020-12-31 LAB — GLUCOSE, CAPILLARY
Glucose-Capillary: 107 mg/dL — ABNORMAL HIGH (ref 70–99)
Glucose-Capillary: 121 mg/dL — ABNORMAL HIGH (ref 70–99)
Glucose-Capillary: 98 mg/dL (ref 70–99)
Glucose-Capillary: 137 mg/dL — ABNORMAL HIGH (ref 70–99)

## 2020-12-31 MED ORDER — TRAZODONE HCL 50 MG PO TABS: 50.0000 mg | ORAL_TABLET | Freq: Every evening | ORAL | Status: DC | PRN

## 2020-12-31 MED ORDER — DEXAMETHASONE 0.5 MG PO TABS
0.5000 mg | ORAL_TABLET | Freq: Three times a day (TID) | ORAL | Status: DC
  Administered 2020-12-31 – 2021-01-01 (×3): 0.5 mg via ORAL
  Filled 2020-12-31 (×4): qty 1

## 2020-12-31 MED ORDER — SENNOSIDES-DOCUSATE SODIUM 8.6-50 MG PO TABS: 1.0000 | ORAL_TABLET | Freq: Every evening | ORAL | Status: DC | PRN

## 2020-12-31 MED ORDER — SODIUM CHLORIDE 0.9 % IV SOLN: INTRAVENOUS | Status: DC

## 2020-12-31 MED ORDER — METOPROLOL TARTRATE 5 MG/5ML IV SOLN: 5.0000 mg | INTRAVENOUS | Status: DC | PRN

## 2020-12-31 MED ORDER — IPRATROPIUM-ALBUTEROL 0.5-2.5 (3) MG/3ML IN SOLN: 3.0000 mL | RESPIRATORY_TRACT | Status: DC | PRN

## 2020-12-31 NOTE — Progress Notes (Signed)
New Admission Note:   Arrival Method: stretcher from ED Mental Orientation: alert and oriented x4 Telemetry: 72m19, CCMD notified Assessment: Completed Skin: skin tear on right arm IV: RFA, infusing NSS 125cc/hr Pain: 0/10 Tubes: None Safety Measures: Safety Fall Prevention Plan has been discussed  Admission: to be completed 5 [place] Orientation: Patient has been orientated to the room, unit and staff.   Family: none at bedside  Orders to be reviewed and implemented. Will continue to monitor the patient. Call light has been placed within reach and bed alarm has been activated.

## 2020-12-31 NOTE — Plan of Care (Signed)
  Problem: Education: Goal: Knowledge of General Education information will improve Description Including pain rating scale, medication(s)/side effects and non-pharmacologic comfort measures Outcome: Progressing   

## 2020-12-31 NOTE — Progress Notes (Signed)
Physical Therapy Treatment Patient Details Name: Charles Lloyd. MRN: [id number] DOB: [birth year] Today's Date: 12/31/2020   History of Present Illness Pt is 80 yo male admitted on 12/30/20 with recent falls and leg weakness. Initial eval was for imminent d/c, but pt found to have AKI and being admitted.  Pt also had MRI spine that revealed stenosis but no acute injury.  Pt with hx including but not limited to cardioversion 2 weeks ago, DM, peripheral neuropathy, CHF, bil TKA, and afib.    PT Comments    Pt making good progress towards his physical therapy goals, demonstrating improved activity tolerance and ambulation distance. Pt able to participate in warm up exercise and then ambulate 300 feet with a walker at a min guard assist level. Worked on serial sit to stands for functional strengthening. Pt still presents with a significant change from his baseline as he was independent and playing golf prior to symptom onset. Will benefit from stair training tomorrow prior to discharge.     Recommendations for follow up therapy are one component of a multi-disciplinary discharge planning process, led by the attending physician.  Recommendations may be updated based on patient status, additional functional criteria and insurance authorization.  Follow Up Recommendations  Home health PT     Assistance Recommended at Discharge PRN  Equipment Recommendations  None recommended by PT (pt states son will obtain RW and toilet riser)    Recommendations for Other Services       Precautions / Restrictions Precautions Precautions: Fall Restrictions Weight Bearing Restrictions: No     Mobility  Bed Mobility Overal bed mobility: Needs Assistance Bed Mobility: Supine to Sit     Supine to sit: Min assist     General bed mobility comments: MinA at trunk    Transfers Overall transfer level: Needs assistance Equipment used: Rolling walker (2 wheels) Transfers: Sit to/from Stand Sit to  Stand: Min guard                Ambulation/Gait Ambulation/Gait assistance: Min guard Gait Distance (Feet): 300 Feet Assistive device: Rolling walker (2 wheels) Gait Pattern/deviations: Step-through pattern;Decreased stride length Gait velocity: decreased   General Gait Details: Slow and steady pace, cues for walker use, tendency for forward head posture. no overt LOB   Stairs             Wheelchair Mobility    Modified Rankin (Stroke Patients Only)       Balance Overall balance assessment: Needs assistance Sitting-balance support: No upper extremity supported;Feet unsupported Sitting balance-Leahy Scale: Good     Standing balance support: Bilateral upper extremity supported Standing balance-Leahy Scale: Poor Standing balance comment: reliant on RW                            Cognition Arousal/Alertness: Awake/alert Behavior During Therapy: WFL for tasks assessed/performed Overall Cognitive Status: Within Functional Limits for tasks assessed                                          Exercises General Exercises - Lower Extremity Long Arc Quad: Both;10 reps;Seated Hip Flexion/Marching: Both;10 reps;Seated Other Exercises Other Exercises: x10 sit to stands progressing from BUE push off, to single UE, then to no UE    General Comments        Pertinent Vitals/Pain Pain Assessment: No/denies pain  Home Living                          Prior Function            PT Goals (current goals can now be found in the care plan section) Acute Rehab PT Goals Patient Stated Goal: return home to normal Potential to Achieve Goals: Good Progress towards PT goals: Progressing toward goals    Frequency    Min 3X/week      PT Plan Current plan remains appropriate    Co-evaluation              AM-PAC PT "6 Clicks" Mobility   Outcome Measure  Help needed turning from your back to your side while in a flat  bed without using bedrails?: None Help needed moving from lying on your back to sitting on the side of a flat bed without using bedrails?: A Little Help needed moving to and from a bed to a chair (including a wheelchair)?: A Little Help needed standing up from a chair using your arms (e.g., wheelchair or bedside chair)?: A Little Help needed to walk in hospital room?: A Little Help needed climbing 3-5 steps with a railing? : A Little 6 Click Score: 19    End of Session Equipment Utilized During Treatment: Gait belt Activity Tolerance: Patient tolerated treatment well Patient left: in chair;with call bell/phone within reach Nurse Communication: Mobility status PT Visit Diagnosis: Other abnormalities of gait and mobility (R26.89);Muscle weakness (generalized) (M62.81)     Time: 4193-7902 PT Time Calculation (min) (ACUTE ONLY): 25 min  Charges:  $Gait Training: 8-22 mins $Therapeutic Activity: 8-22 mins                     Wyona Almas, PT, DPT Acute Rehabilitation Services Pager [phone] Office [phone]    Deno Etienne 12/31/2020, 4:12 PM

## 2020-12-31 NOTE — Plan of Care (Signed)
  Problem: Health Behavior/Discharge Planning: Goal: Ability to manage health-related needs will improve Outcome: Adequate for Discharge   

## 2020-12-31 NOTE — Progress Notes (Signed)
PROGRESS NOTE    Charles Lloyd.  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 fib status post cardioversion/ablation, diastolic CHF EF 81%, HTN, DM2, HLD, asthma/COPD admitted for multiple falls.  Apparently prior to admission patient was ambulating okay even played a full session of golf but on the morning of admission all of a sudden he was having difficulty ambulating.  4 days prior to admission he had some rash on the right mid fingertip went to go see his PCP and was diagnosed with finger cellulitis.  He was prescribed Bactrim.  Neurology was consulted, cervical spine, thoracic and lumbar spine scan showed severe spinal stenosis and lumbar region and foraminal stenosis and C5-C6.  He was also noted to have AKI with creatinine of 2.6, up from baseline of 1.4.   Assessment & Plan:   Active Problems:   AKI (acute kidney injury) ([place])   Acute kidney injury - Could be dehydration versus secondary to Bactrim use.  Bactrim has been stopped, getting gentle IV fluids.  Renal ultrasound is normal - Baseline creatinine 1.4, admission 2.6 > 1.98 - Nephrotoxic drugs on hold.  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  Gabapentin and flecainide adjusted due to renal dysfunction.  For now I will add short course of Decadron - PT recommending home health, OT pending.  Right mid finger cellulitis with some purulent discharge - Doxycycline started.  I&D performed by ED provider  Paroxysmal atrial fibrillation - Currently in sinus rhythm.  Flecainide decreased.  Continue Eliquis twice daily  Essential hypertension - Norvasc, Coreg.  Will order  other as needed medications.  ARB on hold due to AKI  Insulin dependent diabetes mellitus type 2, A1c 6.3 Diabetic neuropathy - Metformin on hold.  Sliding scale and Accu-Cheks.  Chronic diastolic congestive heart failure - Lasix on hold.  Overall appears to be euvolemic getting gentle IV fluids due to AKI.  History of COPD -As needed bronchodilators   DVT prophylaxis: Eliquis Code Status: Full code Family Communication:    Status is: Inpatient  Remains inpatient appropriate because: Still having significant amount of ambulatory dysfunction but slowly improving.  Also AKI getting IV fluids.  Hopefully discharge in next 24-48 hours once his ambulation status is improved along with renal function.    Subjective: Patient still feels weak in his lower extremity.  Tells me his strength is overall okay but he is having hard time describing his balance issues.  His right side of the body still hurts him from the fall but improving  Review of Systems Otherwise negative except as per HPI, including: General: Denies fever, chills, night sweats or unintended weight loss. Resp: Denies cough, wheezing, shortness of breath. Cardiac: Denies chest pain, palpitations, orthopnea, paroxysmal nocturnal dyspnea. GI: Denies abdominal pain, nausea, vomiting, diarrhea or constipation GU: Denies dysuria, frequency, hesitancy or incontinence MS: Denies muscle aches, joint pain or swelling Neuro: Denies headache, neurologic deficits (focal weakness, numbness, tingling), abnormal gait Psych: Denies anxiety, depression, SI/HI/AVH Skin: Denies new rashes or lesions ID: Denies sick contacts, exotic exposures, travel  Examination:  General exam: Appears calm and comfortable  Respiratory system: Clear to auscultation. Respiratory effort normal. Cardiovascular system: S1 & S2 heard, RRR. No  JVD, murmurs, rubs, gallops or clicks. No pedal edema. Gastrointestinal system: Abdomen is nondistended, soft and  nontender. No organomegaly or masses felt. Normal bowel sounds heard. Central nervous system: Alert and oriented. No focal neurological deficits. Extremities: Symmetric 5 x 5 power.  Appears to have great strength in his bilateral lower extremity. Skin: No rashes, lesions or ulcers Psychiatry: Judgement and insight appear normal. Mood & affect appropriate.     Objective: Vitals:   12/30/20 2030 12/30/20 2352 12/31/20 0416 12/31/20 0532  BP: 132/69 (!) 102/46 (!) 105/51   Pulse: 75 70 68   Resp: 18 19 18    Temp: 98.5 F (36.9 C) 98.5 F (36.9 C) 98.3 F (36.8 C)   TempSrc: Oral Oral Oral   SpO2: 97% 95% 91%   Weight:    86.1 kg    Intake/Output Summary (Last 24 hours) at 12/31/2020 0756 Last data filed at 12/31/2020 0600 Gross per 24 hour  Intake 1350.85 ml  Output 550 ml  Net 800.85 ml   Filed Weights   12/30/20 2024 12/31/20 0532  Weight: 86 kg 86.1 kg     Data Reviewed:   CBC: Recent Labs  Lab 12/30/20 1613  WBC 6.4  NEUTROABS 4.7  HGB 10.1*  HCT 29.5*  MCV 88.6  PLT 885*   Basic Metabolic Panel: Recent Labs  Lab 12/30/20 1613 12/31/20 0429  NA 130* 131*  K 3.7 3.5  CL 96* 102  CO2 23 22  GLUCOSE 101* 104*  BUN 25* 20  CREATININE 2.60* 1.98*  CALCIUM 8.9 8.3*   GFR: Estimated Creatinine Clearance: 32.3 mL/min (A) (by C-G formula based on SCr of 1.98 mg/dL (H)). Liver Function Tests: No results for input(s): AST, ALT, ALKPHOS, BILITOT, PROT, ALBUMIN in the last 168 hours. No results for input(s): LIPASE, AMYLASE in the last 168 hours. No results for input(s): AMMONIA in the last 168 hours. Coagulation Profile: No results for input(s): INR, PROTIME in the last 168 hours. Cardiac Enzymes: No results for input(s): CKTOTAL, CKMB, CKMBINDEX, TROPONINI in the last 168 hours. BNP (last 3 results) Recent Labs    02/18/20 1014 03/09/20 0936 06/10/20 1049  PROBNP 2,036* 2,820* 2,486*   HbA1C: Recent Labs    12/30/20 2104  HGBA1C 6.3*    CBG: Recent Labs  Lab 12/30/20 2022 12/31/20 0634  GLUCAP 122* 107*   Lipid Profile: No results for input(s): CHOL, HDL, LDLCALC, TRIG, CHOLHDL, LDLDIRECT in the last 72 hours. Thyroid Function Tests: No results for input(s): TSH, T4TOTAL, FREET4, T3FREE, THYROIDAB in the last 72 hours. Anemia Panel: No results for input(s): VITAMINB12, FOLATE, FERRITIN, TIBC, IRON, RETICCTPCT in the last 72 hours. Sepsis Labs: No results for input(s): PROCALCITON, LATICACIDVEN in the last 168 hours.  Recent Results (from the past 240 hour(s))  Resp Panel by RT-PCR (Flu A&B, Covid) Nasopharyngeal Swab     Status: None   Collection Time: 12/30/20 12:22 PM   Specimen: Nasopharyngeal Swab; Nasopharyngeal(NP) swabs in vial transport medium  Result Value Ref Range Status   SARS Coronavirus 2 by RT PCR NEGATIVE NEGATIVE Final    Comment: (NOTE) SARS-CoV-2 target nucleic acids are NOT DETECTED.  The SARS-CoV-2 RNA is generally detectable in upper respiratory specimens during the acute phase of infection. The lowest concentration of SARS-CoV-2 viral copies this assay can detect is 138 copies/mL. A negative result does not preclude SARS-Cov-2 infection and should not be used as the sole basis for treatment or other patient management decisions. A negative result may occur with  improper specimen collection/handling, submission of specimen other than nasopharyngeal swab, presence of viral mutation(s) within the areas targeted by this assay, and inadequate number of viral copies(<138 copies/mL). A negative result must be combined with clinical observations, patient history, and epidemiological information. The expected result is Negative.  Fact Sheet for Patients:  EntrepreneurPulse.com.au  Fact Sheet for Healthcare Providers:  IncredibleEmployment.be  This test is no t yet approved or cleared by the [place] FDA and  has been authorized for detection  and/or diagnosis of SARS-CoV-2 by FDA under an Emergency Use Authorization (EUA). This EUA will remain  in effect (meaning this test can be used) for the duration of the COVID-19 declaration under Section 564(b)(1) of the Act, 21 U.S.C.section 360bbb-3(b)(1), unless the authorization is terminated  or revoked sooner.       Influenza A by PCR NEGATIVE NEGATIVE Final   Influenza B by PCR NEGATIVE NEGATIVE Final    Comment: (NOTE) The Xpert Xpress SARS-CoV-2/FLU/RSV plus assay is intended as an aid in the diagnosis of influenza from Nasopharyngeal swab specimens and should not be used as a sole basis for treatment. Nasal washings and aspirates are unacceptable for Xpert Xpress SARS-CoV-2/FLU/RSV testing.  Fact Sheet for Patients: EntrepreneurPulse.com.au  Fact Sheet for Healthcare Providers: IncredibleEmployment.be  This test is not yet approved or cleared by the [place] FDA and has been authorized for detection and/or diagnosis of SARS-CoV-2 by FDA under an Emergency Use Authorization (EUA). This EUA will remain in effect (meaning this test can be used) for the duration of the COVID-19 declaration under Section 564(b)(1) of the Act, 21 U.S.C. section 360bbb-3(b)(1), unless the authorization is terminated or revoked.  Performed at [facility], [place] [address], [place], [place] [address]          Radiology Studies: DG Ribs Unilateral W/Chest Right  Result Date: 12/30/2020 CLINICAL DATA:  Post fall earlier today now with right-sided rib pain. EXAM: RIGHT RIBS AND CHEST - 3+ VIEW COMPARISON:  Chest radiograph-01/05/2019 FINDINGS: Grossly unchanged borderline enlarged cardiac silhouette and mediastinal contours with atherosclerotic plaque within the thoracic aorta. No focal airspace opacities. No pleural effusion or pneumothorax. No evidence of edema. No definite displaced right-sided rib fractures. Regional soft tissues appear  normal. No radiopaque foreign body. Stigmata of dish within the thoracic spine. Degenerative change of the lumbar spine is suspected though incompletely evaluated. Post cholecystectomy. Additional surgical clip overlies the lateral aspect of the right mid abdomen, potentially a displaced cholecystectomy clip. IMPRESSION: 1. Borderline cardiomegaly without superimposed acute cardiopulmonary disease. 2. No definite displaced right-sided rib fractures. Electronically Signed   By: Sandi Mariscal M.D.   On: 12/30/2020 14:13   CT Head Wo Contrast  Result Date: 12/30/2020 CLINICAL DATA:  Status post fall. EXAM: CT HEAD WITHOUT CONTRAST CT CERVICAL SPINE WITHOUT CONTRAST TECHNIQUE: Multidetector CT imaging of the head and cervical spine was performed following the standard protocol without intravenous contrast. Multiplanar CT image reconstructions of the cervical spine were also generated. COMPARISON:  None FINDINGS: CT HEAD FINDINGS Brain: No evidence of acute infarction, hemorrhage, hydrocephalus, extra-axial collection or mass lesion/mass effect. There is marked prominence of the CSF spaces overlying the cerebral hemispheres which may reflect prominent brain atrophy and or chronic subdural hygromas, image 18/3. Vascular: No hyperdense vessel or unexpected calcification. Skull: Choose 1 Sinuses/Orbits: Near complete opacification of the right maxillary sinus and ethmoid air cells. Moderate mucosal thickening involves the left maxillary sinus. Air-fluid levels noted within the sphenoid sinus. Partial opacification of the  frontal sinuses. Mastoid air cells appear clear. Other: None CT CERVICAL SPINE FINDINGS Alignment: Normal. Skull base and vertebrae: No acute fracture. No primary bone lesion or focal pathologic process. Soft tissues and spinal canal: No prevertebral fluid or swelling. No visible canal hematoma. Disc levels: Multilevel disc space narrowing and endplate spurring identified. This is most advanced at C5-6  and C6-7. Bilateral facet arthropathy noted. Upper chest: Negative. Other: None IMPRESSION: 1. No acute intracranial abnormalities. 2. Prominence of the CSF spaces overlying the cerebral hemispheres may reflect prominent brain atrophy and/or chronic subdural hygromas. 3. No evidence for cervical spine fracture. 4. Cervical spondylosis. Electronically Signed   By: Kerby Moors M.D.   On: 12/30/2020 13:10   CT Cervical Spine Wo Contrast  Result Date: 12/30/2020 CLINICAL DATA:  Status post fall. EXAM: CT HEAD WITHOUT CONTRAST CT CERVICAL SPINE WITHOUT CONTRAST TECHNIQUE: Multidetector CT imaging of the head and cervical spine was performed following the standard protocol without intravenous contrast. Multiplanar CT image reconstructions of the cervical spine were also generated. COMPARISON:  None FINDINGS: CT HEAD FINDINGS Brain: No evidence of acute infarction, hemorrhage, hydrocephalus, extra-axial collection or mass lesion/mass effect. There is marked prominence of the CSF spaces overlying the cerebral hemispheres which may reflect prominent brain atrophy and or chronic subdural hygromas, image 18/3. Vascular: No hyperdense vessel or unexpected calcification. Skull: Choose 1 Sinuses/Orbits: Near complete opacification of the right maxillary sinus and ethmoid air cells. Moderate mucosal thickening involves the left maxillary sinus. Air-fluid levels noted within the sphenoid sinus. Partial opacification of the frontal sinuses. Mastoid air cells appear clear. Other: None CT CERVICAL SPINE FINDINGS Alignment: Normal. Skull base and vertebrae: No acute fracture. No primary bone lesion or focal pathologic process. Soft tissues and spinal canal: No prevertebral fluid or swelling. No visible canal hematoma. Disc levels: Multilevel disc space narrowing and endplate spurring identified. This is most advanced at C5-6 and C6-7. Bilateral facet arthropathy noted. Upper chest: Negative. Other: None IMPRESSION: 1. No acute  intracranial abnormalities. 2. Prominence of the CSF spaces overlying the cerebral hemispheres may reflect prominent brain atrophy and/or chronic subdural hygromas. 3. No evidence for cervical spine fracture. 4. Cervical spondylosis. Electronically Signed   By: Kerby Moors M.D.   On: 12/30/2020 13:10   MR Cervical Spine W or Wo Contrast  Result Date: 12/30/2020 CLINICAL DATA:  Lower extremity weakness for 1 week. History of prostate cancer. EXAM: MRI CERVICAL, THORACIC AND LUMBAR SPINE WITHOUT AND WITH CONTRAST TECHNIQUE: Multiplanar and multiecho pulse sequences of the cervical spine, to include the craniocervical junction and cervicothoracic junction, and thoracic and lumbar spine, were obtained without and with intravenous contrast. CONTRAST:  41mL GADAVIST GADOBUTROL 1 MMOL/ML IV SOLN COMPARISON:  CT cervical spine dated December 30, 2020. FINDINGS: MRI CERVICAL SPINE FINDINGS Alignment: 3 mm anterolisthesis at C7-T1. Vertebrae: No fracture, evidence of discitis, or bone lesion. Cord: Normal signal and morphology.  No intradural enhancement. Posterior Fossa, vertebral arteries, paraspinal tissues: Negative. Disc levels: C2-C3: Partial interbody and posterior element ankylosis. No stenosis. C3-C4: No significant disc bulge or herniation. Posterior element ankylosis and spurring. Mild bilateral neuroforaminal stenosis. No spinal canal stenosis. C4-C5: No significant disc bulge or herniation. Moderate left greater than right facet uncovertebral hypertrophy. Moderate left and mild right neuroforaminal stenosis. No spinal canal stenosis. C5-C6: Circumferential disc osteophyte complex. Severe bilateral uncovertebral hypertrophy. Mild bilateral facet arthropathy. Mild spinal canal stenosis. Severe bilateral neuroforaminal stenosis. C6-C7: Circumferential disc osteophyte complex. Severe bilateral uncovertebral hypertrophy. Mild bilateral facet arthropathy. Mild  spinal canal stenosis. Severe left and  mild-to-moderate right neuroforaminal stenosis. C7-T1: Negative disc. Severe bilateral facet arthropathy. No stenosis. MRI THORACIC SPINE FINDINGS Alignment:  Trace anterolisthesis at T1-T2, T2-T3, and T4-T5. Vertebrae: No fracture, evidence of discitis, or bone lesion. Cord:  Normal signal and morphology.  No intradural enhancement. Paraspinal and other soft tissues: Trace bilateral pleural effusions. Disc levels: Scattered small disc protrusions. No spinal canal or neuroforaminal stenosis at any level. 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 Alignment: Trace retrolisthesis at L2-L3 and L5-S1. 4 mm anterolisthesis at L4-L5. Vertebrae: No fracture, evidence of discitis, or bone lesion. Asymmetric right-sided degenerative endplate marrow edema at L2-L3. Conus medullaris and cauda equina: Conus extends to the L1-L2 level. Conus and cauda equina appear normal. No intradural enhancement. Paraspinal and other soft tissues: Negative. Disc levels: T12-L1:  Negative. L1-L2: Small circumferential disc osteophyte complex and moderate bilateral facet arthropathy. No stenosis. L2-L3: Moderate circumferential disc osteophyte complex. Moderate bilateral facet arthropathy with ligamentum flavum hypertrophy. Severe spinal canal stenosis. Moderate left and mild right neuroforaminal stenosis. L3-L4: Mild disc bulging and endplate spurring asymmetric to the left. Moderate bilateral facet arthropathy with ligamentum flavum hypertrophy. Mild spinal canal and bilateral neuroforaminal stenosis. L4-L5: Disc uncovering and mild disc bulging. Severe bilateral facet arthropathy with ligamentum flavum hypertrophy. Severe spinal canal stenosis. Mild bilateral neuroforaminal stenosis. L5-S1: Small circumferential disc osteophyte complex eccentric to the right. Moderate bilateral facet arthropathy. Mild right neuroforaminal stenosis. No spinal canal or left neuroforaminal  stenosis. S1-S2: Rudimentary disc.  No stenosis. IMPRESSION: 1. No evidence of metastatic disease.  No acute abnormality. 2. Multilevel cervical spondylosis as described above. Severe bilateral neuroforaminal stenosis at C5-C6 and left neuroforaminal stenosis at C6-C7. 3. 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 4. Transitional lumbosacral anatomy. Correlation with radiographs is recommended prior to any operative intervention. 5. Trace bilateral pleural effusions. Electronically Signed   By: Titus Dubin M.D.   On: 12/30/2020 16:05   MR THORACIC SPINE W WO CONTRAST  Result Date: 12/30/2020 CLINICAL DATA:  Lower extremity weakness for 1 week. History of prostate cancer. EXAM: MRI CERVICAL, THORACIC AND LUMBAR SPINE WITHOUT AND WITH CONTRAST TECHNIQUE: Multiplanar and multiecho pulse sequences of the cervical spine, to include the craniocervical junction and cervicothoracic junction, and thoracic and lumbar spine, were obtained without and with intravenous contrast. CONTRAST:  56mL GADAVIST GADOBUTROL 1 MMOL/ML IV SOLN COMPARISON:  CT cervical spine dated December 30, 2020. FINDINGS: MRI CERVICAL SPINE FINDINGS Alignment: 3 mm anterolisthesis at C7-T1. Vertebrae: No fracture, evidence of discitis, or bone lesion. Cord: Normal signal and morphology.  No intradural enhancement. Posterior Fossa, vertebral arteries, paraspinal tissues: Negative. Disc levels: C2-C3: Partial interbody and posterior element ankylosis. No stenosis. C3-C4: No significant disc bulge or herniation. Posterior element ankylosis and spurring. Mild bilateral neuroforaminal stenosis. No spinal canal stenosis. C4-C5: No significant disc bulge or herniation. Moderate left greater than right facet uncovertebral hypertrophy. Moderate left and mild right neuroforaminal stenosis. No spinal canal stenosis. C5-C6: Circumferential disc osteophyte complex. Severe bilateral uncovertebral hypertrophy. Mild bilateral facet arthropathy. Mild spinal  canal stenosis. Severe bilateral neuroforaminal stenosis. C6-C7: Circumferential disc osteophyte complex. Severe bilateral uncovertebral hypertrophy. Mild bilateral facet arthropathy. Mild spinal canal stenosis. Severe left and mild-to-moderate right neuroforaminal stenosis. C7-T1: Negative disc. Severe bilateral facet arthropathy. No stenosis. MRI THORACIC SPINE FINDINGS Alignment:  Trace anterolisthesis at T1-T2, T2-T3, and T4-T5. Vertebrae: No fracture, evidence of discitis, or bone lesion. Cord:  Normal signal and morphology.  No intradural enhancement. Paraspinal and other soft tissues: Trace bilateral pleural effusions. Disc levels: Scattered small disc protrusions. No spinal canal or neuroforaminal stenosis at any level. 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 Alignment: Trace retrolisthesis at L2-L3 and L5-S1. 4 mm anterolisthesis at L4-L5. Vertebrae: No fracture, evidence of discitis, or bone lesion. Asymmetric right-sided degenerative endplate marrow edema at L2-L3. Conus medullaris and cauda equina: Conus extends to the L1-L2 level. Conus and cauda equina appear normal. No intradural enhancement. Paraspinal and other soft tissues: Negative. Disc levels: T12-L1:  Negative. L1-L2: Small circumferential disc osteophyte complex and moderate bilateral facet arthropathy. No stenosis. L2-L3: Moderate circumferential disc osteophyte complex. Moderate bilateral facet arthropathy with ligamentum flavum hypertrophy. Severe spinal canal stenosis. Moderate left and mild right neuroforaminal stenosis. L3-L4: Mild disc bulging and endplate spurring asymmetric to the left. Moderate bilateral facet arthropathy with ligamentum flavum hypertrophy. Mild spinal canal and bilateral neuroforaminal stenosis. L4-L5: Disc uncovering and mild disc bulging. Severe bilateral facet arthropathy with ligamentum flavum hypertrophy. Severe spinal canal stenosis. Mild  bilateral neuroforaminal stenosis. L5-S1: Small circumferential disc osteophyte complex eccentric to the right. Moderate bilateral facet arthropathy. Mild right neuroforaminal stenosis. No spinal canal or left neuroforaminal stenosis. S1-S2: Rudimentary disc.  No stenosis. IMPRESSION: 1. No evidence of metastatic disease.  No acute abnormality. 2. Multilevel cervical spondylosis as described above. Severe bilateral neuroforaminal stenosis at C5-C6 and left neuroforaminal stenosis at C6-C7. 3. 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 4. Transitional lumbosacral anatomy. Correlation with radiographs is recommended prior to any operative intervention. 5. Trace bilateral pleural effusions. Electronically Signed   By: Titus Dubin M.D.   On: 12/30/2020 16:05   MR Lumbar Spine W Wo Contrast  Result Date: 12/30/2020 CLINICAL DATA:  Lower extremity weakness for 1 week. History of prostate cancer. EXAM: MRI CERVICAL, THORACIC AND LUMBAR SPINE WITHOUT AND WITH CONTRAST TECHNIQUE: Multiplanar and multiecho pulse sequences of the cervical spine, to include the craniocervical junction and cervicothoracic junction, and thoracic and lumbar spine, were obtained without and with intravenous contrast. CONTRAST:  13mL GADAVIST GADOBUTROL 1 MMOL/ML IV SOLN COMPARISON:  CT cervical spine dated December 30, 2020. FINDINGS: MRI CERVICAL SPINE FINDINGS Alignment: 3 mm anterolisthesis at C7-T1. Vertebrae: No fracture, evidence of discitis, or bone lesion. Cord: Normal signal and morphology.  No intradural enhancement. Posterior Fossa, vertebral arteries, paraspinal tissues: Negative. Disc levels: C2-C3: Partial interbody and posterior element ankylosis. No stenosis. C3-C4: No significant disc bulge or herniation. Posterior element ankylosis and spurring. Mild bilateral neuroforaminal stenosis. No spinal canal stenosis. C4-C5: No significant disc bulge or herniation. Moderate left greater than right facet uncovertebral  hypertrophy. Moderate left and mild right neuroforaminal stenosis. No spinal canal stenosis. C5-C6: Circumferential disc osteophyte complex. Severe bilateral uncovertebral hypertrophy. Mild bilateral facet arthropathy. Mild spinal canal stenosis. Severe bilateral neuroforaminal stenosis. C6-C7: Circumferential disc osteophyte complex. Severe bilateral uncovertebral hypertrophy. Mild bilateral facet arthropathy. Mild spinal canal stenosis. Severe left and mild-to-moderate right neuroforaminal stenosis. C7-T1: Negative disc. Severe bilateral facet arthropathy. No stenosis. MRI THORACIC SPINE FINDINGS Alignment:  Trace anterolisthesis at T1-T2, T2-T3, and T4-T5. Vertebrae: No fracture, evidence of discitis, or bone lesion. Cord:  Normal signal and morphology.  No intradural enhancement. Paraspinal and other soft tissues: Trace bilateral pleural effusions. Disc levels: Scattered small disc protrusions. No spinal canal or neuroforaminal stenosis at any level. 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 Alignment: Trace retrolisthesis at L2-L3 and L5-S1. 4  mm anterolisthesis at L4-L5. Vertebrae: No fracture, evidence of discitis, or bone lesion. Asymmetric right-sided degenerative endplate marrow edema at L2-L3. Conus medullaris and cauda equina: Conus extends to the L1-L2 level. Conus and cauda equina appear normal. No intradural enhancement. Paraspinal and other soft tissues: Negative. Disc levels: T12-L1:  Negative. L1-L2: Small circumferential disc osteophyte complex and moderate bilateral facet arthropathy. No stenosis. L2-L3: Moderate circumferential disc osteophyte complex. Moderate bilateral facet arthropathy with ligamentum flavum hypertrophy. Severe spinal canal stenosis. Moderate left and mild right neuroforaminal stenosis. L3-L4: Mild disc bulging and endplate spurring asymmetric to the left. Moderate bilateral facet arthropathy with ligamentum  flavum hypertrophy. Mild spinal canal and bilateral neuroforaminal stenosis. L4-L5: Disc uncovering and mild disc bulging. Severe bilateral facet arthropathy with ligamentum flavum hypertrophy. Severe spinal canal stenosis. Mild bilateral neuroforaminal stenosis. L5-S1: Small circumferential disc osteophyte complex eccentric to the right. Moderate bilateral facet arthropathy. Mild right neuroforaminal stenosis. No spinal canal or left neuroforaminal stenosis. S1-S2: Rudimentary disc.  No stenosis. IMPRESSION: 1. No evidence of metastatic disease.  No acute abnormality. 2. Multilevel cervical spondylosis as described above. Severe bilateral neuroforaminal stenosis at C5-C6 and left neuroforaminal stenosis at C6-C7. 3. 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 4. Transitional lumbosacral anatomy. Correlation with radiographs is recommended prior to any operative intervention. 5. Trace bilateral pleural effusions. Electronically Signed   By: Titus Dubin M.D.   On: 12/30/2020 16:05   US RENAL  Result Date: 12/30/2020 CLINICAL DATA:  Acute kidney injury. EXAM: RENAL / URINARY TRACT ULTRASOUND COMPLETE COMPARISON:  None. FINDINGS: Right Kidney: Renal measurements: 10.2 x 5.5 x 5.1 cm = volume: 149 mL. Echogenicity within normal limits. No mass or hydronephrosis visualized. Left Kidney: Renal measurements: 11.6 x 6.2 x 5.3 cm = volume: 199 mL. Echogenicity within normal limits. No mass or hydronephrosis visualized. Bladder: Appears normal for degree of bladder distention. Other: None. IMPRESSION: Normal renal ultrasound. Electronically Signed   By: Ronney Asters M.D.   On: 12/30/2020 20:01   DG Finger Middle Right  Result Date: 12/30/2020 CLINICAL DATA:  Right middle finger swelling after fall. EXAM: RIGHT MIDDLE FINGER 2+V COMPARISON:  None. FINDINGS: There is no evidence of fracture or dislocation. Mild degenerative changes seen involving the third distal interphalangeal joint. Mild diffuse soft tissue  swelling is noted. IMPRESSION: No fracture or dislocation is noted. 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 Mild soft tissue swelling is noted which may be posttraumatic in etiology. Electronically Signed   By: Marijo Conception M.D.   On: 12/30/2020 12:47        Scheduled Meds:  amLODipine  5 mg Oral Daily   apixaban  5 mg Oral BID   atorvastatin  20 mg Oral QPM   bupivacaine  10 mL Infiltration Once   carvedilol  12.5 mg Oral BID   doxycycline  100 mg Oral Q12H   flecainide  100 mg Oral Daily   fluticasone  2 spray Each Nare Daily   fluticasone furoate-vilanterol  1 puff Inhalation Daily   gabapentin  300 mg Oral QHS   insulin aspart  0-9 Units Subcutaneous TID WC   montelukast  10 mg Oral QHS   saccharomyces boulardii  250 mg Oral BID   Continuous Infusions:   LOS: 1 day   Time spent= 35 mins    Trudie Cervantes Arsenio Loader, MD [facility]  If 7PM-7AM, please contact night-coverage  12/31/2020, 7:56 AM

## 2021-01-01 LAB — BASIC METABOLIC PANEL WITH GFR
Anion gap: 7 (ref 5–15)
BUN: 10 mg/dL (ref 8–23)
CO2: 19 mmol/L — ABNORMAL LOW (ref 22–32)
Calcium: 8.7 mg/dL — ABNORMAL LOW (ref 8.9–10.3)
Chloride: 108 mmol/L (ref 98–111)
Creatinine, Ser: 1.29 mg/dL — ABNORMAL HIGH (ref 0.61–1.24)
GFR, Estimated: 57 mL/min — ABNORMAL LOW
Glucose, Bld: 123 mg/dL — ABNORMAL HIGH (ref 70–99)
Potassium: 4.1 mmol/L (ref 3.5–5.1)
Sodium: 134 mmol/L — ABNORMAL LOW (ref 135–145)

## 2021-01-01 LAB — GLUCOSE, CAPILLARY: Glucose-Capillary: 125 mg/dL — ABNORMAL HIGH (ref 70–99)

## 2021-01-01 LAB — MAGNESIUM: Magnesium: 1.6 mg/dL — ABNORMAL LOW (ref 1.7–2.4)

## 2021-01-01 MED ORDER — DOXYCYCLINE HYCLATE 100 MG PO TABS: 100.0000 mg | ORAL_TABLET | Freq: Two times a day (BID) | ORAL | 0 refills | Status: AC

## 2021-01-01 NOTE — Progress Notes (Signed)
Occupational Therapy Evaluation Patient Details Name: Charles Lloyd. MRN: [id number] DOB: 08/01/1940 Today's Date: 01/01/2021   History of Present Illness Pt is 80 yo male admitted on 12/30/20 with recent falls and leg weakness. Initial eval was for imminent d/c, but pt found to have AKI and being admitted.  Pt also had MRI spine that revealed stenosis but no acute injury.  Pt with hx including but not limited to cardioversion 2 weeks ago, DM, peripheral neuropathy, CHF, bil TKA, and afib.   Clinical Impression   Pt presents with above diagnosis. PTA pt PLOF living at home alone with son assisting as needed and living in close proximity. Pt reports no use of AE, I with ADLs and still driving. Reports being very active, however, reports concerns of previous falls PTA. Pt is currently limited with safe ADL engagement due to impaired strength, safety awareness, and decreased functional mobility. Pt educated on safety of equipment for functional tasks with stability and balance. No further OT needs required at this time. DC to home with supervision, AE/DME recommendation shower seat. Thank you for the referral.   Recommendations for follow up therapy are one component of a multi-disciplinary discharge planning process, led by the attending physician.  Recommendations may be updated based on patient status, additional functional criteria and insurance authorization.   Follow Up Recommendations  No OT follow up    Assistance Recommended at Discharge PRN  Functional Status Assessment  Patient has had a recent decline in their functional status and demonstrates the ability to make significant improvements in function in a reasonable and predictable amount of time.  Equipment Recommendations  Tub/shower seat    Recommendations for Other Services       Precautions / Restrictions Precautions Precautions: Fall Restrictions Weight Bearing Restrictions: No      Mobility Bed Mobility Overal  bed mobility: Needs Assistance             General bed mobility comments: Pt received sitting up in chair upon arrival    Transfers Overall transfer level: Needs assistance   Transfers: Sit to/from Stand Sit to Stand: Min guard           General transfer comment: min guard for safety      Balance Overall balance assessment: Needs assistance Sitting-balance support: No upper extremity supported;Feet unsupported Sitting balance-Leahy Scale: Good Sitting balance - Comments: Able to maintain balance but leans back during MMT   Standing balance support: Bilateral upper extremity supported Standing balance-Leahy Scale: Fair                             ADL either performed or assessed with clinical judgement   ADL Overall ADL's : Needs assistance/impaired Eating/Feeding: Independent     Grooming Details (indicate cue type and reason): not formally assessed, will infer I to mod I           Upper Body Dressing Details (indicate cue type and reason): not formally assessed, will infer mod I Lower Body Dressing: Modified independent Lower Body Dressing Details (indicate cue type and reason): Pt demonstrated donning/doffing of socks while seated EOB with figure 4 position Toilet Transfer: Min Psychiatric nurse Details (indicate cue type and reason): Pt observed with transfering to toilet with no AE, no observation of instabillty or LOB during transfer.                 Vision Baseline Vision/History: 1 Wears glasses (readers)  Perception     Praxis      Pertinent Vitals/Pain Pain Assessment: No/denies pain     Hand Dominance Right   Extremity/Trunk Assessment Upper Extremity Assessment Upper Extremity Assessment: Overall WFL for tasks assessed   Lower Extremity Assessment Lower Extremity Assessment: Defer to PT evaluation   Cervical / Trunk Assessment Cervical / Trunk Assessment: Kyphotic   Communication  Communication Communication: No difficulties   Cognition Arousal/Alertness: Awake/alert Behavior During Therapy: WFL for tasks assessed/performed Overall Cognitive Status: Within Functional Limits for tasks assessed                                 General Comments: Pt continuously focused on "well I walk fine normally," when discussing therapy.     General Comments  VSS    Exercises     Shoulder Instructions      Home Living Family/patient expects to be discharged to:: Private residence Living Arrangements: Alone Available Help at Discharge: Family;Available PRN/intermittently (son lives nearby) Type of Home: House (townhouse) Home Access: Level entry     Home Layout: Two level;Bed/bath upstairs;1/2 bath on main level (bedroom up) Alternate Level Stairs-Number of Steps: 16 Alternate Level Stairs-Rails: Right Bathroom Shower/Tub: Tub/shower unit;Walk-in shower   Bathroom Toilet: Standard     Home Equipment: None          Prior Functioning/Environment Prior Level of Function : Independent/Modified Independent;Driving             Mobility Comments: Normally independent and enjoys playing golf;  Pt reports normally moves well but in past 2 weeks (after cardioversion) has had episodes of sudden weakness and falling on floor. ADLs Comments: Independent with ADLs/IADLs        OT Problem List:        OT Treatment/Interventions:      OT Goals(Current goals can be found in the care plan section) Acute Rehab OT Goals Patient Stated Goal: To go home OT Goal Formulation: With patient Time For Goal Achievement: 01/15/21 Potential to Achieve Goals: Good  OT Frequency:     Barriers to D/C:            Co-evaluation              AM-PAC OT "6 Clicks" Daily Activity     Outcome Measure Help from another person eating meals?: None Help from another person taking care of personal grooming?: None Help from another person toileting, which  includes using toliet, bedpan, or urinal?: None Help from another person bathing (including washing, rinsing, drying)?: A Little Help from another person to put on and taking off regular upper body clothing?: None Help from another person to put on and taking off regular lower body clothing?: None 6 Click Score: 23   End of Session Equipment Utilized During Treatment: Gait belt Nurse Communication: Mobility status  Activity Tolerance: Patient tolerated treatment well Patient left: in chair;with call bell/phone within reach                   Time: 6546-5035 OT Time Calculation (min): 13 min Charges:  OT General Charges $OT Visit: 1 Visit OT Evaluation $OT Eval Low Complexity: Froid, MSOT, OTR/L  Supplemental Rehabilitation Services  [phone]   Marius Ditch 01/01/2021, 10:07 AM

## 2021-01-01 NOTE — Progress Notes (Signed)
DISCHARGE NOTE HOME Charles Lloyd. to be discharged Home per MD order. Discussed prescriptions and follow up appointments with the patient. Prescriptions given to patient; medication list explained in detail. Patient verbalized understanding.  Skin clean, dry and intact without evidence of skin break down, no evidence of skin tears noted. IV catheter discontinued intact. Site without signs and symptoms of complications. Dressing and pressure applied. Pt denies pain at the site currently. No complaints noted.  Patient free of lines, drains, and wounds.   An After Visit Summary (AVS) was printed and given to the patient. Patient escorted via wheelchair, and discharged home via private auto.  Turner, Zenon Mayo, RN

## 2021-01-01 NOTE — TOC Transition Note (Signed)
Transition of Care [facility]) - CM/SW Discharge Note   Patient Details  Name: Charles Lloyd. MRN: [id number] Date of Birth: 30-Jul-1940  [facility]) CM/SW Contact:  Bartholomew Crews, RN Phone Number: [phone] 01/01/2021, 10:27 AM   Clinical Narrative:     Spoke with patient on hospital room phone to discuss transition home. Offered HH PT per PT recommendations. Patient stated that he is not sure that this will be helpful plus he would be traveling with family over the next couple weeks. Patient is agreeable to follow up appointment with PCP and understands that PCP can refer to [facility] if he feels he needs it at a later date. No further TOC needs identified.   Final next level of care: Home/Self Care Barriers to Discharge: No Barriers Identified   Patient Goals and CMS Choice        Discharge Placement                       Discharge Plan and Services                                     Social Determinants of Health (SDOH) Interventions     Readmission Risk Interventions No flowsheet data found.

## 2021-01-01 NOTE — Discharge Summary (Signed)
Physician Discharge Summary  [name] AGT:[id number] DOB: 23-Feb-1941 DOA: 12/30/2020  PCP: Isaac Bliss, [name], MD  Admit date: 12/30/2020 Discharge date: 01/01/2021  Admitted From: Home Disposition: Home  Recommendations for Outpatient Follow-up:  Follow up with PCP in 1 week with repeat CBC/BMP Might need outpatient orthopedic/neurosurgery evaluation and follow-up Follow up in ED if symptoms worsen or new appear   Home Health: Home with PT Equipment/Devices: None  Discharge Condition: Stable CODE STATUS: Full Diet recommendation: Heart healthy/carb modified/fluid restriction of up to 1500 cc a day  Brief/Interim Summary: 80 year old with history of 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 fib status post cardioversion/ablation, diastolic CHF EF 22%, HTN, DM2, HLD, asthma/COPD, recent right middle finger cellulitis treated with Bactrim as an outpatient by PCP admitted for multiple falls.  On presentation, cervical spine, thoracic and lumbar spine scan showed severe spinal stenosis of lumbar region and foraminal stenosis of C5 and C6.  He was found to have AKI with creatinine of 2.6, up from baseline of 1.4.  He was started on IV fluids.  He underwent bedside I&D of the middle finger by ED provider.  Subsequently started on doxycycline.  Has tolerated PT.  His condition has improved.  Renal function has improved as well.  He wants to go home today.  He will be discharged home today with outpatient follow-up with PCP and might need orthopedic/neurosurgery evaluation as an outpatient.  Discharge Diagnoses:   Acute kidney injury -From dehydration and Bactrim use.  Renal ultrasound normal.  Presented with creatinine of 2.6; baseline creatinine 1.4.  Treated with IV fluids.  Creatinine much improved; 1.29 today. -Keep valsartan, torsemide and metformin on hold till reevaluation by PCP  Hyponatremia -Improving.  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  Patient comfortable with plan for outpatient follow-up with PCP who can refer him to orthopedic/neurosurgery for possible need for steroid injection if needed.  Right middle finger cellulitis and possible abscess -I&D performed by ED provider.  Currently on doxycycline.  Cellulitis improving.  Continue doxycycline upon discharge for 7 days.  Essential hypertension -Continue Norvasc and Coreg.  Valsartan and torsemide to remain on hold because of recent acute kidney injury  Diabetes mellitus type 2 -Recent A1c 6.3.  Metformin to remain on hold till reevaluation by PCP.  Carb modified diet.  Outpatient follow-up.  Chronic diastolic CHF -Currently compensated.  Outpatient follow-up with cardiology.  We will keep torsemide and valsartan on hold.  Continue Coreg.  COPD Currently stable  Paroxysmal A. Fib -Continue Coreg, flecainide and Eliquis  Discharge Instructions  Discharge Instructions     Diet - low sodium heart healthy   Complete by: As directed    Diet Carb Modified   Complete by: As directed    Increase activity slowly   Complete by: As directed       Allergies as of 01/01/2021   No Known Allergies      Medication List     STOP taking these medications    metFORMIN 500 MG tablet Commonly known as: GLUCOPHAGE   potassium chloride SA 20 MEQ tablet Commonly known as: Klor-Con Q82   PROBIOTIC COLIC PO   sulfamethoxazole-trimethoprim 800-160 MG tablet Commonly known as: BACTRIM DS   torsemide 20 MG tablet Commonly known as: DEMADEX   valsartan 160 MG tablet Commonly known as: Diovan       TAKE these medications    Accu-Chek Aviva Plus test  strip Generic drug: glucose blood 1 each by Other route daily. Dx E11.9   albuterol 108 (90 Base) MCG/ACT inhaler Commonly known as: VENTOLIN HFA Inhale 1-2 puffs into the lungs every 6 (six) hours as needed for  wheezing or shortness of breath.   amLODipine 5 MG tablet Commonly known as: NORVASC Take 1 tablet (5 mg total) by mouth daily.   amoxicillin 500 MG capsule Commonly known as: AMOXIL Take 2,000 mg by mouth once as needed (prior to dental procedure.).   apixaban 5 MG Tabs tablet Commonly known as: Eliquis Take 1 tablet (5 mg total) by mouth 2 (two) times daily.   atorvastatin 20 MG tablet Commonly known as: LIPITOR TAKE 1 TABLET DAILY AT 6 P.M. What changed: See the new instructions.   carvedilol 12.5 MG tablet Commonly known as: COREG Take 1 tablet (12.5 mg total) by mouth 2 (two) times daily.   doxycycline 100 MG tablet Commonly known as: VIBRA-TABS Take 1 tablet (100 mg total) by mouth every 12 (twelve) hours for 7 days.   flecainide 100 MG tablet Commonly known as: TAMBOCOR Take 1 tablet (100 mg total) by mouth 2 (two) times daily.   fluticasone 50 MCG/ACT nasal spray Commonly known as: FLONASE Place 2 sprays into both nostrils daily.   fluticasone furoate-vilanterol 100-25 MCG/INH Aepb Commonly known as: Breo Ellipta Inhale 1 puff into the lungs daily.   gabapentin 300 MG capsule Commonly known as: NEURONTIN TAKE 2 CAPSULES BY MOUTH AT BEDTIME   montelukast 10 MG tablet Commonly known as: SINGULAIR Take 10 mg by mouth at bedtime.   multivitamin with minerals tablet Take 2 tablets by mouth daily. Gummies   Restasis 0.05 % ophthalmic emulsion Generic drug: cycloSPORINE Place 1 drop into both eyes 2 (two) times daily.   saccharomyces boulardii 250 MG capsule Commonly known as: Florastor Take 1 capsule (250 mg total) by mouth 2 (two) times daily.        Follow-up Information     Isaac Bliss, [name], MD. Schedule an appointment as soon as possible for a visit in 1 week(s).   Specialty: Internal Medicine Why: with repeat cbc/bmp Contact information: [address] [place] [address] [phone]         [name], MD  .   Specialty: Cardiology Contact information: [address] [place] [address] [phone]                No Known Allergies  Consultations: None   Procedures/Studies: DG Ribs Unilateral W/Chest Right  Result Date: 12/30/2020 CLINICAL DATA:  Post fall earlier today now with right-sided rib pain. EXAM: RIGHT RIBS AND CHEST - 3+ VIEW COMPARISON:  Chest radiograph-01/05/2019 FINDINGS: Grossly unchanged borderline enlarged cardiac silhouette and mediastinal contours with atherosclerotic plaque within the thoracic aorta. No focal airspace opacities. No pleural effusion or pneumothorax. No evidence of edema. No definite displaced right-sided rib fractures. Regional soft tissues appear normal. No radiopaque foreign body. Stigmata of dish within the thoracic spine. Degenerative change of the lumbar spine is suspected though incompletely evaluated. Post cholecystectomy. Additional surgical clip overlies the lateral aspect of the right mid abdomen, potentially a displaced cholecystectomy clip. IMPRESSION: 1. Borderline cardiomegaly without superimposed acute cardiopulmonary disease. 2. No definite displaced right-sided rib fractures. Electronically Signed   By: Sandi Mariscal M.D.   On: 12/30/2020 14:13   CT Head Wo Contrast  Result Date: 12/30/2020 CLINICAL DATA:  Status post fall. EXAM: CT HEAD WITHOUT CONTRAST CT  CERVICAL SPINE WITHOUT CONTRAST TECHNIQUE: Multidetector CT imaging of the head and cervical spine was performed following the standard protocol without intravenous contrast. Multiplanar CT image reconstructions of the cervical spine were also generated. COMPARISON:  None FINDINGS: CT HEAD FINDINGS Brain: No evidence of acute infarction, hemorrhage, hydrocephalus, extra-axial collection or mass lesion/mass effect. There is marked prominence of the CSF spaces overlying the cerebral hemispheres which may reflect prominent brain atrophy and or chronic subdural hygromas,  image 18/3. Vascular: No hyperdense vessel or unexpected calcification. Skull: Choose 1 Sinuses/Orbits: Near complete opacification of the right maxillary sinus and ethmoid air cells. Moderate mucosal thickening involves the left maxillary sinus. Air-fluid levels noted within the sphenoid sinus. Partial opacification of the frontal sinuses. Mastoid air cells appear clear. Other: None CT CERVICAL SPINE FINDINGS Alignment: Normal. Skull base and vertebrae: No acute fracture. No primary bone lesion or focal pathologic process. Soft tissues and spinal canal: No prevertebral fluid or swelling. No visible canal hematoma. Disc levels: Multilevel disc space narrowing and endplate spurring identified. This is most advanced at C5-6 and C6-7. Bilateral facet arthropathy noted. Upper chest: Negative. Other: None IMPRESSION: 1. No acute intracranial abnormalities. 2. Prominence of the CSF spaces overlying the cerebral hemispheres may reflect prominent brain atrophy and/or chronic subdural hygromas. 3. No evidence for cervical spine fracture. 4. Cervical spondylosis. Electronically Signed   By: Kerby Moors M.D.   On: 12/30/2020 13:10   CT Cervical Spine Wo Contrast  Result Date: 12/30/2020 CLINICAL DATA:  Status post fall. EXAM: CT HEAD WITHOUT CONTRAST CT CERVICAL SPINE WITHOUT CONTRAST TECHNIQUE: Multidetector CT imaging of the head and cervical spine was performed following the standard protocol without intravenous contrast. Multiplanar CT image reconstructions of the cervical spine were also generated. COMPARISON:  None FINDINGS: CT HEAD FINDINGS Brain: No evidence of acute infarction, hemorrhage, hydrocephalus, extra-axial collection or mass lesion/mass effect. There is marked prominence of the CSF spaces overlying the cerebral hemispheres which may reflect prominent brain atrophy and or chronic subdural hygromas, image 18/3. Vascular: No hyperdense vessel or unexpected calcification. Skull: Choose 1 Sinuses/Orbits:  Near complete opacification of the right maxillary sinus and ethmoid air cells. Moderate mucosal thickening involves the left maxillary sinus. Air-fluid levels noted within the sphenoid sinus. Partial opacification of the frontal sinuses. Mastoid air cells appear clear. Other: None CT CERVICAL SPINE FINDINGS Alignment: Normal. Skull base and vertebrae: No acute fracture. No primary bone lesion or focal pathologic process. Soft tissues and spinal canal: No prevertebral fluid or swelling. No visible canal hematoma. Disc levels: Multilevel disc space narrowing and endplate spurring identified. This is most advanced at C5-6 and C6-7. Bilateral facet arthropathy noted. Upper chest: Negative. Other: None IMPRESSION: 1. No acute intracranial abnormalities. 2. Prominence of the CSF spaces overlying the cerebral hemispheres may reflect prominent brain atrophy and/or chronic subdural hygromas. 3. No evidence for cervical spine fracture. 4. Cervical spondylosis. Electronically Signed   By: Kerby Moors M.D.   On: 12/30/2020 13:10   MR Cervical Spine W or Wo Contrast  Result Date: 12/30/2020 CLINICAL DATA:  Lower extremity weakness for 1 week. History of prostate cancer. EXAM: MRI CERVICAL, THORACIC AND LUMBAR SPINE WITHOUT AND WITH CONTRAST TECHNIQUE: Multiplanar and multiecho pulse sequences of the cervical spine, to include the craniocervical junction and cervicothoracic junction, and thoracic and lumbar spine, were obtained without and with intravenous contrast. CONTRAST:  2mL GADAVIST GADOBUTROL 1 MMOL/ML IV SOLN COMPARISON:  CT cervical spine dated December 30, 2020. FINDINGS: MRI CERVICAL SPINE FINDINGS Alignment:  3 mm anterolisthesis at C7-T1. Vertebrae: No fracture, evidence of discitis, or bone lesion. Cord: Normal signal and morphology.  No intradural enhancement. Posterior Fossa, vertebral arteries, paraspinal tissues: Negative. Disc levels: C2-C3: Partial interbody and posterior element ankylosis. No stenosis.  C3-C4: No significant disc bulge or herniation. Posterior element ankylosis and spurring. Mild bilateral neuroforaminal stenosis. No spinal canal stenosis. C4-C5: No significant disc bulge or herniation. Moderate left greater than right facet uncovertebral hypertrophy. Moderate left and mild right neuroforaminal stenosis. No spinal canal stenosis. C5-C6: Circumferential disc osteophyte complex. Severe bilateral uncovertebral hypertrophy. Mild bilateral facet arthropathy. Mild spinal canal stenosis. Severe bilateral neuroforaminal stenosis. C6-C7: Circumferential disc osteophyte complex. Severe bilateral uncovertebral hypertrophy. Mild bilateral facet arthropathy. Mild spinal canal stenosis. Severe left and mild-to-moderate right neuroforaminal stenosis. C7-T1: Negative disc. Severe bilateral facet arthropathy. No stenosis. MRI THORACIC SPINE FINDINGS Alignment:  Trace anterolisthesis at T1-T2, T2-T3, and T4-T5. Vertebrae: No fracture, evidence of discitis, or bone lesion. Cord:  Normal signal and morphology.  No intradural enhancement. Paraspinal and other soft tissues: Trace bilateral pleural effusions. Disc levels: Scattered small disc protrusions. No spinal canal or neuroforaminal stenosis at any level. 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 Alignment: Trace retrolisthesis at L2-L3 and L5-S1. 4 mm anterolisthesis at L4-L5. Vertebrae: No fracture, evidence of discitis, or bone lesion. Asymmetric right-sided degenerative endplate marrow edema at L2-L3. Conus medullaris and cauda equina: Conus extends to the L1-L2 level. Conus and cauda equina appear normal. No intradural enhancement. Paraspinal and other soft tissues: Negative. Disc levels: T12-L1:  Negative. L1-L2: Small circumferential disc osteophyte complex and moderate bilateral facet arthropathy. No stenosis. L2-L3: Moderate circumferential disc osteophyte complex. Moderate bilateral facet  arthropathy with ligamentum flavum hypertrophy. Severe spinal canal stenosis. Moderate left and mild right neuroforaminal stenosis. L3-L4: Mild disc bulging and endplate spurring asymmetric to the left. Moderate bilateral facet arthropathy with ligamentum flavum hypertrophy. Mild spinal canal and bilateral neuroforaminal stenosis. L4-L5: Disc uncovering and mild disc bulging. Severe bilateral facet arthropathy with ligamentum flavum hypertrophy. Severe spinal canal stenosis. Mild bilateral neuroforaminal stenosis. L5-S1: Small circumferential disc osteophyte complex eccentric to the right. Moderate bilateral facet arthropathy. Mild right neuroforaminal stenosis. No spinal canal or left neuroforaminal stenosis. S1-S2: Rudimentary disc.  No stenosis. IMPRESSION: 1. No evidence of metastatic disease.  No acute abnormality. 2. Multilevel cervical spondylosis as described above. Severe bilateral neuroforaminal stenosis at C5-C6 and left neuroforaminal stenosis at C6-C7. 3. 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 4. Transitional lumbosacral anatomy. Correlation with radiographs is recommended prior to any operative intervention. 5. Trace bilateral pleural effusions. Electronically Signed   By: Titus Dubin M.D.   On: 12/30/2020 16:05   MR THORACIC SPINE W WO CONTRAST  Result Date: 12/30/2020 CLINICAL DATA:  Lower extremity weakness for 1 week. History of prostate cancer. EXAM: MRI CERVICAL, THORACIC AND LUMBAR SPINE WITHOUT AND WITH CONTRAST TECHNIQUE: Multiplanar and multiecho pulse sequences of the cervical spine, to include the craniocervical junction and cervicothoracic junction, and thoracic and lumbar spine, were obtained without and with intravenous contrast. CONTRAST:  22mL GADAVIST GADOBUTROL 1 MMOL/ML IV SOLN COMPARISON:  CT cervical spine dated December 30, 2020. FINDINGS: MRI CERVICAL SPINE FINDINGS Alignment: 3 mm anterolisthesis at C7-T1. Vertebrae: No fracture, evidence of discitis, or bone  lesion. Cord: Normal signal and morphology.  No intradural enhancement. Posterior Fossa, vertebral arteries, paraspinal tissues: Negative. Disc levels: C2-C3: Partial interbody and posterior element ankylosis. No stenosis. C3-C4: No significant disc bulge or  herniation. Posterior element ankylosis and spurring. Mild bilateral neuroforaminal stenosis. No spinal canal stenosis. C4-C5: No significant disc bulge or herniation. Moderate left greater than right facet uncovertebral hypertrophy. Moderate left and mild right neuroforaminal stenosis. No spinal canal stenosis. C5-C6: Circumferential disc osteophyte complex. Severe bilateral uncovertebral hypertrophy. Mild bilateral facet arthropathy. Mild spinal canal stenosis. Severe bilateral neuroforaminal stenosis. C6-C7: Circumferential disc osteophyte complex. Severe bilateral uncovertebral hypertrophy. Mild bilateral facet arthropathy. Mild spinal canal stenosis. Severe left and mild-to-moderate right neuroforaminal stenosis. C7-T1: Negative disc. Severe bilateral facet arthropathy. No stenosis. MRI THORACIC SPINE FINDINGS Alignment:  Trace anterolisthesis at T1-T2, T2-T3, and T4-T5. Vertebrae: No fracture, evidence of discitis, or bone lesion. Cord:  Normal signal and morphology.  No intradural enhancement. Paraspinal and other soft tissues: Trace bilateral pleural effusions. Disc levels: Scattered small disc protrusions. No spinal canal or neuroforaminal stenosis at any level. 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 Alignment: Trace retrolisthesis at L2-L3 and L5-S1. 4 mm anterolisthesis at L4-L5. Vertebrae: No fracture, evidence of discitis, or bone lesion. Asymmetric right-sided degenerative endplate marrow edema at L2-L3. Conus medullaris and cauda equina: Conus extends to the L1-L2 level. Conus and cauda equina appear normal. No intradural enhancement. Paraspinal and other soft tissues: Negative.  Disc levels: T12-L1:  Negative. L1-L2: Small circumferential disc osteophyte complex and moderate bilateral facet arthropathy. No stenosis. L2-L3: Moderate circumferential disc osteophyte complex. Moderate bilateral facet arthropathy with ligamentum flavum hypertrophy. Severe spinal canal stenosis. Moderate left and mild right neuroforaminal stenosis. L3-L4: Mild disc bulging and endplate spurring asymmetric to the left. Moderate bilateral facet arthropathy with ligamentum flavum hypertrophy. Mild spinal canal and bilateral neuroforaminal stenosis. L4-L5: Disc uncovering and mild disc bulging. Severe bilateral facet arthropathy with ligamentum flavum hypertrophy. Severe spinal canal stenosis. Mild bilateral neuroforaminal stenosis. L5-S1: Small circumferential disc osteophyte complex eccentric to the right. Moderate bilateral facet arthropathy. Mild right neuroforaminal stenosis. No spinal canal or left neuroforaminal stenosis. S1-S2: Rudimentary disc.  No stenosis. IMPRESSION: 1. No evidence of metastatic disease.  No acute abnormality. 2. Multilevel cervical spondylosis as described above. Severe bilateral neuroforaminal stenosis at C5-C6 and left neuroforaminal stenosis at C6-C7. 3. 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 4. Transitional lumbosacral anatomy. Correlation with radiographs is recommended prior to any operative intervention. 5. Trace bilateral pleural effusions. Electronically Signed   By: Titus Dubin M.D.   On: 12/30/2020 16:05   MR Lumbar Spine W Wo Contrast  Result Date: 12/30/2020 CLINICAL DATA:  Lower extremity weakness for 1 week. History of prostate cancer. EXAM: MRI CERVICAL, THORACIC AND LUMBAR SPINE WITHOUT AND WITH CONTRAST TECHNIQUE: Multiplanar and multiecho pulse sequences of the cervical spine, to include the craniocervical junction and cervicothoracic junction, and thoracic and lumbar spine, were obtained without and with intravenous contrast. CONTRAST:  66mL GADAVIST  GADOBUTROL 1 MMOL/ML IV SOLN COMPARISON:  CT cervical spine dated December 30, 2020. FINDINGS: MRI CERVICAL SPINE FINDINGS Alignment: 3 mm anterolisthesis at C7-T1. Vertebrae: No fracture, evidence of discitis, or bone lesion. Cord: Normal signal and morphology.  No intradural enhancement. Posterior Fossa, vertebral arteries, paraspinal tissues: Negative. Disc levels: C2-C3: Partial interbody and posterior element ankylosis. No stenosis. C3-C4: No significant disc bulge or herniation. Posterior element ankylosis and spurring. Mild bilateral neuroforaminal stenosis. No spinal canal stenosis. C4-C5: No significant disc bulge or herniation. Moderate left greater than right facet uncovertebral hypertrophy. Moderate left and mild right neuroforaminal stenosis. No spinal canal stenosis. C5-C6: Circumferential disc osteophyte complex. Severe bilateral uncovertebral  hypertrophy. Mild bilateral facet arthropathy. Mild spinal canal stenosis. Severe bilateral neuroforaminal stenosis. C6-C7: Circumferential disc osteophyte complex. Severe bilateral uncovertebral hypertrophy. Mild bilateral facet arthropathy. Mild spinal canal stenosis. Severe left and mild-to-moderate right neuroforaminal stenosis. C7-T1: Negative disc. Severe bilateral facet arthropathy. No stenosis. MRI THORACIC SPINE FINDINGS Alignment:  Trace anterolisthesis at T1-T2, T2-T3, and T4-T5. Vertebrae: No fracture, evidence of discitis, or bone lesion. Cord:  Normal signal and morphology.  No intradural enhancement. Paraspinal and other soft tissues: Trace bilateral pleural effusions. Disc levels: Scattered small disc protrusions. No spinal canal or neuroforaminal stenosis at any level. 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 Alignment: Trace retrolisthesis at L2-L3 and L5-S1. 4 mm anterolisthesis at L4-L5. Vertebrae: No fracture, evidence of discitis, or bone lesion. Asymmetric right-sided  degenerative endplate marrow edema at L2-L3. Conus medullaris and cauda equina: Conus extends to the L1-L2 level. Conus and cauda equina appear normal. No intradural enhancement. Paraspinal and other soft tissues: Negative. Disc levels: T12-L1:  Negative. L1-L2: Small circumferential disc osteophyte complex and moderate bilateral facet arthropathy. No stenosis. L2-L3: Moderate circumferential disc osteophyte complex. Moderate bilateral facet arthropathy with ligamentum flavum hypertrophy. Severe spinal canal stenosis. Moderate left and mild right neuroforaminal stenosis. L3-L4: Mild disc bulging and endplate spurring asymmetric to the left. Moderate bilateral facet arthropathy with ligamentum flavum hypertrophy. Mild spinal canal and bilateral neuroforaminal stenosis. L4-L5: Disc uncovering and mild disc bulging. Severe bilateral facet arthropathy with ligamentum flavum hypertrophy. Severe spinal canal stenosis. Mild bilateral neuroforaminal stenosis. L5-S1: Small circumferential disc osteophyte complex eccentric to the right. Moderate bilateral facet arthropathy. Mild right neuroforaminal stenosis. No spinal canal or left neuroforaminal stenosis. S1-S2: Rudimentary disc.  No stenosis. IMPRESSION: 1. No evidence of metastatic disease.  No acute abnormality. 2. Multilevel cervical spondylosis as described above. Severe bilateral neuroforaminal stenosis at C5-C6 and left neuroforaminal stenosis at C6-C7. 3. 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 4. Transitional lumbosacral anatomy. Correlation with radiographs is recommended prior to any operative intervention. 5. Trace bilateral pleural effusions. Electronically Signed   By: Titus Dubin M.D.   On: 12/30/2020 16:05   US RENAL  Result Date: 12/30/2020 CLINICAL DATA:  Acute kidney injury. EXAM: RENAL / URINARY TRACT ULTRASOUND COMPLETE COMPARISON:  None. FINDINGS: Right Kidney: Renal measurements: 10.2 x 5.5 x 5.1 cm = volume: 149 mL. Echogenicity  within normal limits. No mass or hydronephrosis visualized. Left Kidney: Renal measurements: 11.6 x 6.2 x 5.3 cm = volume: 199 mL. Echogenicity within normal limits. No mass or hydronephrosis visualized. Bladder: Appears normal for degree of bladder distention. Other: None. IMPRESSION: Normal renal ultrasound. Electronically Signed   By: Ronney Asters M.D.   On: 12/30/2020 20:01   DG Finger Middle Right  Result Date: 12/30/2020 CLINICAL DATA:  Right middle finger swelling after fall. EXAM: RIGHT MIDDLE FINGER 2+V COMPARISON:  None. FINDINGS: There is no evidence of fracture or dislocation. Mild degenerative changes seen involving the third distal interphalangeal joint. Mild diffuse soft tissue swelling is noted. IMPRESSION: No fracture or dislocation is noted. 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 Mild soft tissue swelling is noted which may be posttraumatic in etiology. Electronically Signed   By: Marijo Conception M.D.   On: 12/30/2020 12:47      Subjective: Patient seen and examined at bedside.  Feels much better and wants to go home today.  Denies worsening shortness of breath, nausea, vomiting.  Discharge Exam: Vitals:   01/01/21 0429 01/01/21  0922  BP: (!) 148/78 (!) 142/75  Pulse: 73 70  Resp: 20 18  Temp: 97.9 F (36.6 C) 98 F (36.7 C)  SpO2: 100% 100%    General: Pt is alert, awake, not in acute distress.  Currently on room air. Cardiovascular: rate controlled, S1/S2 + Respiratory: bilateral decreased breath sounds at bases Abdominal: Soft, NT, ND, bowel sounds + Extremities: Trace lower extreme edema; no cyanosis.  Right middle finger erythema is improving.    The results of significant diagnostics from this hospitalization (including imaging, microbiology, ancillary and laboratory) are listed below for reference.     Microbiology: Recent Results (from the past 240 hour(s))  Resp Panel by RT-PCR (Flu A&B, Covid) Nasopharyngeal Swab      Status: None   Collection Time: 12/30/20 12:22 PM   Specimen: Nasopharyngeal Swab; Nasopharyngeal(NP) swabs in vial transport medium  Result Value Ref Range Status   SARS Coronavirus 2 by RT PCR NEGATIVE NEGATIVE Final    Comment: (NOTE) SARS-CoV-2 target nucleic acids are NOT DETECTED.  The SARS-CoV-2 RNA is generally detectable in upper respiratory specimens during the acute phase of infection. The lowest concentration of SARS-CoV-2 viral copies this assay can detect is 138 copies/mL. A negative result does not preclude SARS-Cov-2 infection and should not be used as the sole basis for treatment or other patient management decisions. A negative result may occur with  improper specimen collection/handling, submission of specimen other than nasopharyngeal swab, presence of viral mutation(s) within the areas targeted by this assay, and inadequate number of viral copies(<138 copies/mL). A negative result must be combined with clinical observations, patient history, and epidemiological information. The expected result is Negative.  Fact Sheet for Patients:  EntrepreneurPulse.com.au  Fact Sheet for Healthcare Providers:  IncredibleEmployment.be  This test is no t yet approved or cleared by the [place] FDA and  has been authorized for detection and/or diagnosis of SARS-CoV-2 by FDA under an Emergency Use Authorization (EUA). This EUA will remain  in effect (meaning this test can be used) for the duration of the COVID-19 declaration under Section 564(b)(1) of the Act, 21 U.S.C.section 360bbb-3(b)(1), unless the authorization is terminated  or revoked sooner.       Influenza A by PCR NEGATIVE NEGATIVE Final   Influenza B by PCR NEGATIVE NEGATIVE Final    Comment: (NOTE) The Xpert Xpress SARS-CoV-2/FLU/RSV plus assay is intended as an aid in the diagnosis of influenza from Nasopharyngeal swab specimens and should not be used as a sole basis  for treatment. Nasal washings and aspirates are unacceptable for Xpert Xpress SARS-CoV-2/FLU/RSV testing.  Fact Sheet for Patients: EntrepreneurPulse.com.au  Fact Sheet for Healthcare Providers: IncredibleEmployment.be  This test is not yet approved or cleared by the [place] FDA and has been authorized for detection and/or diagnosis of SARS-CoV-2 by FDA under an Emergency Use Authorization (EUA). This EUA will remain in effect (meaning this test can be used) for the duration of the COVID-19 declaration under Section 564(b)(1) of the Act, 21 U.S.C. section 360bbb-3(b)(1), unless the authorization is terminated or revoked.  Performed at [facility], [place] [address], [place], [place] [address]      Labs: BNP (last 3 results) No results for input(s): BNP in the last 8760 hours. Basic Metabolic Panel: Recent Labs  Lab 12/30/20 1613 12/31/20 0429 01/01/21 0650  NA 130* 131* 134*  K 3.7 3.5 4.1  CL 96* 102 108  CO2 23 22 19*  GLUCOSE 101* 104* 123*  BUN 25* 20  10  CREATININE 2.60* 1.98* 1.29*  CALCIUM 8.9 8.3* 8.7*  MG  --   --  1.6*   Liver Function Tests: No results for input(s): AST, ALT, ALKPHOS, BILITOT, PROT, ALBUMIN in the last 168 hours. No results for input(s): LIPASE, AMYLASE in the last 168 hours. No results for input(s): AMMONIA in the last 168 hours. CBC: Recent Labs  Lab 12/30/20 1613  WBC 6.4  NEUTROABS 4.7  HGB 10.1*  HCT 29.5*  MCV 88.6  PLT 145*   Cardiac Enzymes: No results for input(s): CKTOTAL, CKMB, CKMBINDEX, TROPONINI in the last 168 hours. BNP: Invalid input(s): POCBNP CBG: Recent Labs  Lab 12/31/20 0634 12/31/20 1138 12/31/20 1642 12/31/20 2104 01/01/21 0614  GLUCAP 107* 121* 98 137* 125*   D-Dimer No results for input(s): DDIMER in the last 72 hours. Hgb A1c Recent Labs    12/30/20 2104  HGBA1C 6.3*   Lipid Profile No results for input(s): CHOL, HDL, LDLCALC, TRIG,  CHOLHDL, LDLDIRECT in the last 72 hours. Thyroid function studies No results for input(s): TSH, T4TOTAL, T3FREE, THYROIDAB in the last 72 hours.  Invalid input(s): FREET3 Anemia work up No results for input(s): VITAMINB12, FOLATE, FERRITIN, TIBC, IRON, RETICCTPCT in the last 72 hours. Urinalysis    Component Value Date/Time   COLORURINE YELLOW 12/30/2020 1218   APPEARANCEUR CLEAR 12/30/2020 1218   LABSPEC 1.012 12/30/2020 1218   PHURINE 6.0 12/30/2020 [place] 12/30/2020 [place] NEGATIVE 12/30/2020 [place] 12/30/2020 [place] 12/30/2020 [place] 12/30/2020 1218   NITRITE NEGATIVE 12/30/2020 1218   LEUKOCYTESUR NEGATIVE 12/30/2020 1218   Sepsis Labs Invalid input(s): PROCALCITONIN,  WBC,  LACTICIDVEN Microbiology Recent Results (from the past 240 hour(s))  Resp Panel by RT-PCR (Flu A&B, Covid) Nasopharyngeal Swab     Status: None   Collection Time: 12/30/20 12:22 PM   Specimen: Nasopharyngeal Swab; Nasopharyngeal(NP) swabs in vial transport medium  Result Value Ref Range Status   SARS Coronavirus 2 by RT PCR NEGATIVE NEGATIVE Final    Comment: (NOTE) SARS-CoV-2 target nucleic acids are NOT DETECTED.  The SARS-CoV-2 RNA is generally detectable in upper respiratory specimens during the acute phase of infection. The lowest concentration of SARS-CoV-2 viral copies this assay can detect is 138 copies/mL. A negative result does not preclude SARS-Cov-2 infection and should not be used as the sole basis for treatment or other patient management decisions. A negative result may occur with  improper specimen collection/handling, submission of specimen other than nasopharyngeal swab, presence of viral mutation(s) within the areas targeted by this assay, and inadequate number of viral copies(<138 copies/mL). A negative result must be combined with clinical observations, patient history, and  epidemiological information. The expected result is Negative.  Fact Sheet for Patients:  EntrepreneurPulse.com.au  Fact Sheet for Healthcare Providers:  IncredibleEmployment.be  This test is no t yet approved or cleared by the [place] FDA and  has been authorized for detection and/or diagnosis of SARS-CoV-2 by FDA under an Emergency Use Authorization (EUA). This EUA will remain  in effect (meaning this test can be used) for the duration of the COVID-19 declaration under Section 564(b)(1) of the Act, 21 U.S.C.section 360bbb-3(b)(1), unless the authorization is terminated  or revoked sooner.       Influenza A by PCR NEGATIVE NEGATIVE Final   Influenza B by PCR NEGATIVE NEGATIVE Final    Comment: (NOTE) The Xpert Xpress SARS-CoV-2/FLU/RSV plus assay is intended as  an aid in the diagnosis of influenza from Nasopharyngeal swab specimens and should not be used as a sole basis for treatment. Nasal washings and aspirates are unacceptable for Xpert Xpress SARS-CoV-2/FLU/RSV testing.  Fact Sheet for Patients: EntrepreneurPulse.com.au  Fact Sheet for Healthcare Providers: IncredibleEmployment.be  This test is not yet approved or cleared by the [place] FDA and has been authorized for detection and/or diagnosis of SARS-CoV-2 by FDA under an Emergency Use Authorization (EUA). This EUA will remain in effect (meaning this test can be used) for the duration of the COVID-19 declaration under Section 564(b)(1) of the Act, 21 U.S.C. section 360bbb-3(b)(1), unless the authorization is terminated or revoked.  Performed at [facility], [place] [address] [place], [place] [address]      Time coordinating discharge: 35 minutes  SIGNED:   Aline August, MD  [facility] 01/01/2021, 11:29 AM

## 2021-01-02 ENCOUNTER — Telehealth: Payer: Self-pay

## 2021-01-02 NOTE — Telephone Encounter (Signed)
Transition Care Management Follow-up Telephone Call Date of discharge and from where: 01/01/2021 How have you been since you were released from the hospital? Feeling a lot better, about to go traveling Any questions or concerns? No  Items Reviewed: Did the pt receive and understand the discharge instructions provided? Yes  Medications obtained and verified? Yes  Other? No  Any new allergies since your discharge? No  Dietary orders reviewed? Yes Do you have support at home? Yes   Home Care and Equipment/Supplies: Were home health services ordered? not applicable If so, what is the name of the agency? N/a  Has the agency set up a time to come to the patient's home? not applicable Were any new equipment or medical supplies ordered?  No What is the name of the medical supply agency? N/a Were you able to get the supplies/equipment? not applicable Do you have any questions related to the use of the equipment or supplies? No  Functional Questionnaire: (I = Independent and D = Dependent) ADLs: I  Bathing/Dressing- I  Meal Prep- I  Eating- I  Maintaining continence- I  Transferring/Ambulation- I  Managing Meds- I  Follow up appointments reviewed:  PCP Hospital f/u appt confirmed? No  Patient declined to have a hospital follow up appointment. Are transportation arrangements needed? No  If their condition worsens, is the pt aware to call PCP or go to the Emergency Dept.? Yes Was the patient provided with contact information for the PCP's office or ED? Yes Was to pt encouraged to call back with questions or concerns? Yes

## 2021-01-04 ENCOUNTER — Other Ambulatory Visit: Payer: Self-pay

## 2021-01-05 ENCOUNTER — Encounter: Payer: Self-pay | Admitting: Internal Medicine

## 2021-01-05 ENCOUNTER — Ambulatory Visit (INDEPENDENT_AMBULATORY_CARE_PROVIDER_SITE_OTHER): Payer: Medicare Other | Admitting: Internal Medicine

## 2021-01-05 VITALS — BP 150/70 | HR 86 | Temp 98.0°F | Wt 191.8 lb

## 2021-01-05 DIAGNOSIS — I10 Essential (primary) hypertension: Secondary | ICD-10-CM | POA: Diagnosis not present

## 2021-01-05 DIAGNOSIS — R296 Repeated falls: Secondary | ICD-10-CM

## 2021-01-05 DIAGNOSIS — N179 Acute kidney failure, unspecified: Secondary | ICD-10-CM

## 2021-01-05 DIAGNOSIS — L03011 Cellulitis of right finger: Secondary | ICD-10-CM

## 2021-01-05 DIAGNOSIS — M48062 Spinal stenosis, lumbar region with neurogenic claudication: Secondary | ICD-10-CM | POA: Diagnosis not present

## 2021-01-05 DIAGNOSIS — Z09 Encounter for follow-up examination after completed treatment for conditions other than malignant neoplasm: Secondary | ICD-10-CM

## 2021-01-05 MED ORDER — VALSARTAN 160 MG PO TABS: 160.0000 mg | ORAL_TABLET | Freq: Every day | ORAL | 3 refills | Status: DC

## 2021-01-05 MED ORDER — TORSEMIDE 20 MG PO TABS: 20.0000 mg | ORAL_TABLET | Freq: Every day | ORAL | Status: DC

## 2021-01-05 NOTE — Patient Instructions (Signed)
-  Nice seeing you today!!  -Lab work today; will notify you once results are available.  -Resume valsartan 160 mg daily and torsemide 20 mg daily.  -Schedule follow up in 3 months.  Referral to neurosurgeon has been requested.

## 2021-01-05 NOTE — Progress Notes (Signed)
Hospital follow-up visit     CC/Reason for Visit: Hospitalization follow-up  HPI: Charles Lloyd. is a 80 y.o. male who is coming in today for the above mentioned reasons, specifically transitional care services face-to-face visit.    Dates hospitalized: 12/30/2020-01/01/2021 Days since discharge from hospital: 4 Patient was discharged from the hospital to: Home Reason for admission to hospital: Frequent falls, acute renal failure Date of interactive phone contact with patient and/or caregiver: 01/02/2021  I have reviewed in detail patient's discharge summary plus pertinent specific notes, labs, and images from the hospitalization.  Yes Patient presented to the hospital on 12/30/2020 after a fall at home on the right side of his body.  This was mechanical in nature.  He had been having falls at home leading up to this event as well.  All x-rays and images were negative for fracture or stroke.  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  He also has significant peripheral neuropathy that is now thought could be stemming from these issues.  In the hospital he was diagnosed with acute renal failure with a creatinine of 2.6, baseline creatinine is usually 1.4 and it was 1.29 on discharge.  Because of this, upon discharge he was asked to hold his valsartan, torsemide and metformin.  He has noticed that his blood pressure has been elevated at home ever since and he has developed significant lower extremity edema.  In the hospital a cellulitis of his middle finger was I and D and he was placed on doxycycline which she still has a few more days remaining.  In the hospital he was noted to be hyponatremic that had improved by time of discharge.  He is feeling better but still feels quite weak.  He is concerned about elevated blood pressures and lower  extremity edema.   Medication reconciliation was done today and patient is taking meds as recommended by discharging hospitalist/specialist.  Yes   Past Medical/Surgical History: Past Medical History:  Diagnosis Date   Acute tracheobronchitis 01/05/2019   B12 deficiency    Basal cell carcinoma (BCC) of left side of nose 01/28/2018   Cancer ([place])    Cardiomyopathy ([place]) 03/10/2018   With chronic atrial fibrillation   CHF (congestive heart failure) ([place]) 04/16/2013   Functional class II, ejection fraction 35-40%  Formatting of this note might be different from the original. Functional class II, ejection fraction 35-40%   Chronic anticoagulation    Chronic diastolic (congestive) heart failure ([place]) 04/16/2013   Functional class II, ejection fraction 35-40%  Last Assessment & Plan:  Clinically stable Volume well controlled meds reviewed   Chronic diastolic CHF (congestive heart failure) ([place]) 04/16/2013   Functional class II, ejection fraction 35-40%  Last Assessment & Plan:  Clinically stable Volume well controlled meds reviewed   Colon polyps 04/16/2013   Diabetic neuropathy (HCC)    Eosinophil count raised 02/17/2019   Essential hypertension 01/06/2010   Last Assessment & Plan:  Well controlled Continue med management   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  Steroid usage was discontinued several months ago.  Follow up with Dermatology as planned.   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   Last Assessment & Plan:  Id following ?ongoing abx managementy reported Request records   Infection of prosthetic right  knee joint ([place])    Insomnia 03/04/2012   Grief with loss of wife 02/20/12  Last Assessment & Plan:  Improved sx  contineu xanax prn Se discussed   Lesion of liver 04/20/2019   -On cardiac CT 02/2019   Mild reactive airways disease    Neoplasm of prostate 04/16/2013   Neoplasm of prostate, 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 Transthoracic Echocardiogram-03/10/2013-[facility]: Normal left ventricular wall thickness and cavity size.  Global left ventricular systolic function is moderately reduced.  The estimated ejection fraction is 35-40%.  The left atrium is moderately enlarged.  The right atrium is mildly enlarged.  No significant valvular    Overweight (BMI 25.0-29.9) 10/22/2016   Persistent atrial fibrillation ([place]) 04/16/2013   Last Assessment & Plan:  Rate controlled Continue med management eliquis for stroke preventino  Formatting of this note might be different from the original.  Drug  HX Current Rx Pre-ABL inefficacy Pre-ABL intolerant Post-ABL inefficacy Post-ABL intolerant max dose/24h M/Y end comments  sotalol                  dofetilide                  flecainide                  propafenone                  am   S/P TKR (total knee replacement), bilateral    Secondary hypercoagulable state ([place]) 04/09/2019   Tinea cruris 04/16/2013   Type 2 diabetes mellitus with diabetic polyneuropathy, without long-term current use of insulin ([place]) 04/16/2013   Last Assessment & Plan:  Labs today Pt reports well controlled on ambulatory monitoring   Vitamin D deficiency 07/25/2018    Past Surgical History:  Procedure Laterality Date   ATRIAL FIBRILLATION ABLATION N/A 03/12/2019   Procedure:  ATRIAL FIBRILLATION ABLATION;  Surgeon: [name], MD;  Location: [facility];  Service: Cardiovascular;  Laterality: N/A;   ATRIAL FIBRILLATION ABLATION N/A 04/29/2020   Procedure: ATRIAL FIBRILLATION ABLATION;  Surgeon: [name], MD;  Location: [facility];  Service: Cardiovascular;  Laterality: N/A;   CARDIAC ELECTROPHYSIOLOGY STUDY AND ABLATION     CARDIOVERSION     CARDIOVERSION N/A 10/10/2018   Procedure: CARDIOVERSION;  Surgeon: [name], MD;  Location: [facility] ENDOSCOPY;  Service: Cardiovascular;  Laterality: N/A;   CARDIOVERSION N/A 05/18/2020   Procedure: CARDIOVERSION;  Surgeon: [name], MD;  Location: [facility] ENDOSCOPY;  Service: Cardiovascular;  Laterality: N/A;   CARDIOVERSION N/A 12/09/2020   Procedure: CARDIOVERSION;  Surgeon: Lelon Perla, MD;  Location: [facility] ENDOSCOPY;  Service: Cardiovascular;  Laterality: N/A;   CHOLECYSTECTOMY     PROSTATECTOMY     REPLACEMENT TOTAL KNEE BILATERAL      Social History:  reports that he has  never smoked. He has never used smokeless tobacco. He reports current alcohol use of about 4.0 standard drinks per week. He reports that he does not use drugs.  Allergies: No Known Allergies  Family History:  Family History  Problem Relation Age of Onset   Cancer Mother    Depression Mother    Early death Mother    Cancer Father    Depression Father    Early death Father      Current Outpatient Medications:    albuterol (VENTOLIN HFA) 108 (90 Base) MCG/ACT inhaler, Inhale 1-2 puffs into the lungs every 6 (six) hours as needed for wheezing or shortness of breath., Disp: 8 g, Rfl: 2   amoxicillin (AMOXIL) 500 MG capsule, Take 2,000 mg by mouth once as needed (prior to dental procedure.)., Disp: , Rfl:    apixaban (ELIQUIS) 5 MG TABS tablet, Take 1 tablet (5 mg total) by mouth 2 (two) times daily., Disp: 180 tablet, Rfl: 1   atorvastatin (LIPITOR) 20 MG tablet, TAKE 1 TABLET DAILY AT 6 P.M. (Patient  taking differently: Take 20 mg by mouth every evening.), Disp: 90 tablet, Rfl: 3   carvedilol (COREG) 12.5 MG tablet, Take 1 tablet (12.5 mg total) by mouth 2 (two) times daily., Disp: 180 tablet, Rfl: 3   doxycycline (VIBRA-TABS) 100 MG tablet, Take 1 tablet (100 mg total) by mouth every 12 (twelve) hours for 7 days., Disp: 14 tablet, Rfl: 0   flecainide (TAMBOCOR) 100 MG tablet, Take 1 tablet (100 mg total) by mouth 2 (two) times daily., Disp: 60 tablet, Rfl: 3   fluticasone (FLONASE) 50 MCG/ACT nasal spray, Place 2 sprays into both nostrils daily., Disp: 16 g, Rfl: 2   fluticasone furoate-vilanterol (BREO ELLIPTA) 100-25 MCG/INH AEPB, Inhale 1 puff into the lungs daily., Disp: 60 each, Rfl: 11   gabapentin (NEURONTIN) 300 MG capsule, TAKE 2 CAPSULES BY MOUTH AT BEDTIME (Patient taking differently: Take 600 mg by mouth at bedtime.), Disp: 60 capsule, Rfl: 0   glucose blood (ACCU-CHEK AVIVA PLUS) test strip, 1 each by Other route daily. Dx E11.9, Disp: 100 each, Rfl: 12   montelukast (SINGULAIR) 10 MG tablet, Take 10 mg by mouth at bedtime. , Disp: , Rfl:    Multiple Vitamins-Minerals (MULTIVITAMIN WITH MINERALS) tablet, Take 2 tablets by mouth daily. Gummies, Disp: , Rfl:    RESTASIS 0.05 % ophthalmic emulsion, Place 1 drop into both eyes 2 (two) times daily., Disp: , Rfl:    saccharomyces boulardii (FLORASTOR) 250 MG capsule, Take 1 capsule (250 mg total) by mouth 2 (two) times daily., Disp: 180 capsule, Rfl: 0   torsemide (DEMADEX) 20 MG tablet, Take 1 tablet (20 mg total) by mouth daily., Disp: , Rfl:    valsartan (DIOVAN) 160 MG tablet, Take 1 tablet (160 mg total) by mouth daily., Disp: 90 tablet, Rfl: 3   amLODipine (NORVASC) 5 MG tablet, Take 1 tablet (5 mg total) by mouth daily., Disp: 90 tablet, Rfl: 3  Review of Systems:  Constitutional: Denies fever, chills, diaphoresis, appetite change and fatigue.  HEENT: Denies photophobia, eye pain, redness, hearing loss, ear pain, congestion, sore  throat, rhinorrhea, sneezing, mouth sores, trouble swallowing, neck pain, neck stiffness and tinnitus.   Respiratory: Denies SOB, DOE, cough, chest tightness,  and wheezing.   Cardiovascular: Denies chest pain, palpitations and leg swelling.  Gastrointestinal: Denies nausea, vomiting, abdominal pain, diarrhea, constipation, blood in stool and abdominal distention.  Genitourinary: Denies dysuria, urgency, frequency, hematuria, flank pain and difficulty urinating.  Endocrine: Denies: hot or cold intolerance, sweats, changes in hair or nails, polyuria, polydipsia. Musculoskeletal: Denies myalgias, back pain, joint swelling, arthralgias and gait problem.  Skin: Denies pallor, rash and wound.  Neurological: Denies dizziness, seizures, syncope, light-headedness,and headaches.  Hematological: Denies adenopathy. Easy bruising, personal or family bleeding history  Psychiatric/Behavioral: Denies suicidal ideation, mood changes, confusion, nervousness, sleep disturbance and agitation    Physical Exam: Vitals:   01/05/21 1534  BP: (!) 150/70  Pulse: 86  Temp: 98 F (36.7 C)  TempSrc: Oral  SpO2: 99%  Weight: 191 lb 12.8 oz (87 kg)    Body mass index is 27.52 kg/m.   Constitutional: NAD, calm, comfortable Eyes: PERRL, lids and conjunctivae normal ENMT: Mucous membranes are moist.  Respiratory: clear to auscultation bilaterally, no wheezing, no crackles. Normal respiratory effort. No accessory muscle use.  Cardiovascular: Regular rate and rhythm, no murmurs / rubs / gallops.  2+ pitting bilateral lower extremity edema.   Psychiatric: Normal judgment and insight. Alert and oriented x 3. Normal mood.    Impression and Plan:  Hospital discharge follow-up  AKI (acute kidney injury) ([place]) - Plan: CBC with Differential/Platelet, Comprehensive metabolic panel  Multiple falls  Spinal stenosis of lumbar region with neurogenic claudication - Plan: Ambulatory referral to Neurosurgery  Primary  hypertension - Plan: valsartan (DIOVAN) 160 MG tablet, torsemide (DEMADEX) 20 MG tablet  Cellulitis of right finger  -CBC and basic metabolic profile will be ordered today to follow on renal function, sodium, WBC as recommended by discharging physician. -Based on frequent falling and peripheral neuropathy and findings on MRI, I will place neurosurgical referral today. -He is still taking doxycycline for his middle finger cellulitis, this appears improved.  If it recurs I would suggest considering a referral to a hand specialist. -As his blood pressure has been elevated since discharge and he has developed significant lower extremity edema, I will go ahead and resume his valsartan and torsemide with close monitoring of renal function.   Medical decision making of high complexity was utilized today.  Patient Instructions  -Nice seeing you today!!  -Lab work today; will notify you once results are available.  -Resume valsartan 160 mg daily and torsemide 20 mg daily.  -Schedule follow up in 3 months.  Referral to neurosurgeon has been requested.    Lelon Frohlich, MD [place] [facility]

## 2021-01-06 ENCOUNTER — Other Ambulatory Visit (INDEPENDENT_AMBULATORY_CARE_PROVIDER_SITE_OTHER): Payer: Medicare Other

## 2021-01-06 DIAGNOSIS — N179 Acute kidney failure, unspecified: Secondary | ICD-10-CM | POA: Diagnosis not present

## 2021-01-06 DIAGNOSIS — N289 Disorder of kidney and ureter, unspecified: Secondary | ICD-10-CM

## 2021-01-06 LAB — COMPREHENSIVE METABOLIC PANEL
ALT: 31 U/L (ref 0–53)
AST: 36 U/L (ref 0–37)
Albumin: 3.8 g/dL (ref 3.5–5.2)
Alkaline Phosphatase: 76 U/L (ref 39–117)
BUN: 16 mg/dL (ref 6–23)
CO2: 25 mEq/L (ref 19–32)
Calcium: 8.9 mg/dL (ref 8.4–10.5)
Chloride: 102 mEq/L (ref 96–112)
Creatinine, Ser: 1.24 mg/dL (ref 0.40–1.50)
GFR: 55.88 mL/min — ABNORMAL LOW (ref >60.00)
Glucose, Bld: 99 mg/dL (ref 70–99)
Potassium: 4.1 mEq/L (ref 3.5–5.1)
Sodium: 133 mEq/L — ABNORMAL LOW (ref 135–145)
Total Bilirubin: 0.9 mg/dL (ref 0.2–1.2)
Total Protein: 6.2 g/dL (ref 6.0–8.3)

## 2021-01-06 LAB — CBC WITH DIFFERENTIAL/PLATELET
Basophils Absolute: 0 10*3/uL (ref 0.0–0.1)
Basophils Relative: 0.9 % (ref 0.0–3.0)
Eosinophils Absolute: 0.6 10*3/uL (ref 0.0–0.7)
Eosinophils Relative: 13.5 % — ABNORMAL HIGH (ref 0.0–5.0)
HCT: 29.1 % — ABNORMAL LOW (ref 39.0–52.0)
Hemoglobin: 9.7 g/dL — ABNORMAL LOW (ref 13.0–17.0)
Lymphocytes Relative: 23.6 % (ref 12.0–46.0)
Lymphs Abs: 1.1 10*3/uL (ref 0.7–4.0)
MCHC: 33.4 g/dL (ref 30.0–36.0)
MCV: 90.9 fl (ref 78.0–100.0)
Monocytes Absolute: 0.6 10*3/uL (ref 0.1–1.0)
Monocytes Relative: 13.4 % — ABNORMAL HIGH (ref 3.0–12.0)
Neutro Abs: 2.3 10*3/uL (ref 1.4–7.7)
Neutrophils Relative %: 48.6 % (ref 43.0–77.0)
Platelets: 225 10*3/uL (ref 150.0–400.0)
RBC: 3.2 Mil/uL — ABNORMAL LOW (ref 4.22–5.81)
RDW: 15.6 % — ABNORMAL HIGH (ref 11.5–15.5)
WBC: 4.7 10*3/uL (ref 4.0–10.5)

## 2021-01-11 ENCOUNTER — Other Ambulatory Visit: Payer: Self-pay | Admitting: Internal Medicine

## 2021-01-11 DIAGNOSIS — G6289 Other specified polyneuropathies: Secondary | ICD-10-CM

## 2021-01-30 ENCOUNTER — Telehealth: Payer: Self-pay

## 2021-01-30 ENCOUNTER — Encounter: Payer: Self-pay | Admitting: Family Medicine

## 2021-01-30 ENCOUNTER — Ambulatory Visit (INDEPENDENT_AMBULATORY_CARE_PROVIDER_SITE_OTHER): Payer: Medicare Other | Admitting: Family Medicine

## 2021-01-30 VITALS — BP 144/68 | HR 56 | Temp 97.9°F | Ht 70.0 in | Wt 185.9 lb

## 2021-01-30 DIAGNOSIS — R0981 Nasal congestion: Secondary | ICD-10-CM | POA: Diagnosis not present

## 2021-01-30 DIAGNOSIS — R051 Acute cough: Secondary | ICD-10-CM

## 2021-01-30 LAB — POCT INFLUENZA A/B
Influenza A, POC: NEGATIVE
Influenza B, POC: NEGATIVE

## 2021-01-30 LAB — POC COVID19 BINAXNOW: SARS Coronavirus 2 Ag: NEGATIVE

## 2021-01-30 MED ORDER — DOXYCYCLINE HYCLATE 100 MG PO CAPS: 100.0000 mg | ORAL_CAPSULE | Freq: Two times a day (BID) | ORAL | 0 refills | Status: DC

## 2021-01-30 NOTE — Telephone Encounter (Signed)
--  Caller states he has fever 100 oral, nasal congestion, coughing up yellow phlegm. Symptoms started Sat.  01/30/2021 8:06:06 AM See HCP within 4 Hours (or PCP triage) Windle Guard, RN, Olin Hauser   Comments User: Charles Hazel, RN Date/Time Eilene Ghazi Time): 01/30/2021 8:03:22 AM Has Diabetes, HTN  Referrals REFERRED TO PCP OFFICE  01/30/21 1302:  Patient calling in with respiratory symptoms: Shortness of breath, chest pain, palpitations or other red words send to Triage  Does the patient have a fever over 100, cough, congestion, sore throat, runny nose, lost of taste/smell within the last 5 days (please list symptoms that patient has)?fever of 100 (resolved now), nasal congestion, cough with yellow mucous  Have you tested for Covid in the last 5 days? No   "you will have to arrive 72mins prior to your appt time to be Covid tested. Please park in back of office at the cone & call [phone] to let the staff know you have arrived. A staff member will meet you at your car to do a rapid covid test. Once the test has resulted you will be notified by phone of your results to determine if appt will remain an in person visit or be converted to a virtual/phone visit."

## 2021-01-30 NOTE — Patient Instructions (Signed)
Follow up for any fever, increased shortness of breath or other concerns.

## 2021-01-30 NOTE — Progress Notes (Signed)
Established Patient Office Visit  Subjective:  Patient ID: Charles Kurt., male    DOB: 04-08-1940  Age: 80 y.o. MRN: [id number]  CC:  Chief Complaint  Patient presents with   Nasal Congestion    Patient complains of nasal congestion, x2 days    Cough    Patient complains of cough, x2 days Productive cough with yellow sputum     HPI Charles Lloyd. presents for acute upper respiratory symptoms which started Saturday.  He initially thought this may be the flu.  He has had influenza vaccine.  He developed some sore throat and nasal congestion.  He thinks he may have had some low-grade fever.  Since then, he has had increasing production of yellow sputum.  He was concerned because of pneumonia history in the past.  Denies any dyspnea at this time.  Non-smoker.  He was also concerned because he and his son are heading out of town in the morning to [place] to go to a family funeral.  Past Medical History:  Diagnosis Date   Acute tracheobronchitis 01/05/2019   B12 deficiency    Basal cell carcinoma (BCC) of left side of nose 01/28/2018   Cancer (HCC)    Cardiomyopathy (HCC) 03/10/2018   With chronic atrial fibrillation   CHF (congestive heart failure) (HCC) 04/16/2013   Functional class II, ejection fraction 35-40%  Formatting of this note might be different from the original. Functional class II, ejection fraction 35-40%   Chronic anticoagulation    Chronic diastolic (congestive) heart failure (HCC) 04/16/2013   Functional class II, ejection fraction 35-40%  Last Assessment & Plan:  Clinically stable Volume well controlled meds reviewed   Chronic diastolic CHF (congestive heart failure) (HCC) 04/16/2013   Functional class II, ejection fraction 35-40%  Last Assessment & Plan:  Clinically stable Volume well controlled meds reviewed   Colon polyps 04/16/2013   Diabetic neuropathy (HCC)    Eosinophil count raised 02/17/2019   Essential hypertension 01/06/2010   Last Assessment & Plan:   Well controlled Continue med management   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  Steroid usage was discontinued several months ago.  Follow up with Dermatology as planned.   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   Last Assessment & Plan:  Id following ?ongoing abx managementy reported Request records   Infection of prosthetic right knee joint (HCC)    Insomnia 03/04/2012   Grief with loss of wife 02/20/12  Last Assessment & Plan:  Improved sx  contineu xanax prn Se discussed   Lesion of liver 04/20/2019   -On cardiac CT 02/2019   Mild reactive airways disease    Neoplasm of prostate 04/16/2013   Neoplasm of prostate, 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 Transthoracic Echocardiogram-03/10/2013-[facility]: Normal left ventricular wall thickness and cavity size.  Global left  ventricular systolic function is moderately reduced.  The estimated ejection fraction is 35-40%.  The left atrium is moderately enlarged.  The right atrium is mildly enlarged.  No significant valvular    Overweight (BMI 25.0-29.9) 10/22/2016   Persistent atrial fibrillation ([place]) 04/16/2013   Last Assessment & Plan:  Rate controlled Continue med management eliquis for stroke preventino  Formatting of this note might be different from the original.   Drug  HX Current Rx Pre-ABL inefficacy Pre-ABL intolerant Post-ABL inefficacy Post-ABL intolerant max dose/24h M/Y end comments  sotalol                  dofetilide                  flecainide                  propafenone                  am   S/P TKR (total knee replacement), bilateral    Secondary hypercoagulable state ([place]) 04/09/2019   Tinea cruris 04/16/2013   Type 2 diabetes mellitus with diabetic polyneuropathy, without long-term current use of insulin ([place]) 04/16/2013   Last Assessment & Plan:  Labs today Pt reports well controlled on ambulatory monitoring   Vitamin D deficiency 07/25/2018    Past Surgical History:  Procedure Laterality Date   ATRIAL FIBRILLATION ABLATION N/A 03/12/2019   Procedure: ATRIAL FIBRILLATION ABLATION;  Surgeon: [name], MD;  Location: [place] [facility];  Service: Cardiovascular;  Laterality: N/A;   ATRIAL FIBRILLATION ABLATION N/A 04/29/2020   Procedure: ATRIAL FIBRILLATION ABLATION;  Surgeon: [name], MD;  Location: [place] [facility];  Service: Cardiovascular;  Laterality: N/A;   CARDIAC ELECTROPHYSIOLOGY STUDY AND ABLATION     CARDIOVERSION     CARDIOVERSION N/A 10/10/2018   Procedure: CARDIOVERSION;  Surgeon: [name], MD;  Location: [place];  Service: Cardiovascular;  Laterality: N/A;   CARDIOVERSION N/A 05/18/2020   Procedure: CARDIOVERSION;  Surgeon: [name], MD;  Location: [facility] ENDOSCOPY;  Service: Cardiovascular;  Laterality: N/A;   CARDIOVERSION N/A 12/09/2020   Procedure: CARDIOVERSION;  Surgeon: Lelon Perla, MD;  Location[facility];  Service: Cardiovascular;  Laterality: N/A;   CHOLECYSTECTOMY     PROSTATECTOMY     REPLACEMENT TOTAL KNEE BILATERAL      Family History  Problem Relation Age of Onset   Cancer Mother    Depression Mother    Early death Mother    Cancer Father    Depression Father    Early death Father     Social History   Socioeconomic History   Marital status: Widowed     Spouse name: Not on file   Number of children: Not on file   Years of education: Not on file   Highest education level: Not on file  Occupational History   Not on file  Tobacco Use   Smoking status: Never   Smokeless tobacco: Never  Vaping Use   Vaping Use: Never used  Substance and Sexual Activity   Alcohol use: Yes    Alcohol/week: 4.0 standard drinks    Types: 2 Glasses of wine, 2 Standard drinks or equivalent per week    Comment: daily   Drug use: Never   Sexual activity: Not Currently  Other Topics Concern   Not on file  Social History Narrative   Not on file   Social Determinants of Health   Financial  Resource Strain: Not on file  Food Insecurity: Not on file  Transportation Needs: Not on file  Physical Activity: Not on file  Stress: Not on file  Social Connections: Not on file  Intimate Partner Violence: Not on file    Outpatient Medications Prior to Visit  Medication Sig Dispense Refill   albuterol (VENTOLIN HFA) 108 (90 Base) MCG/ACT inhaler Inhale 1-2 puffs into the lungs every 6 (six) hours as needed for wheezing or shortness of breath. 8 g 2   amoxicillin (AMOXIL) 500 MG capsule Take 2,000 mg by mouth once as needed (prior to dental procedure.).     apixaban (ELIQUIS) 5 MG TABS tablet Take 1 tablet (5 mg total) by mouth 2 (two) times daily. 180 tablet 1   atorvastatin (LIPITOR) 20 MG tablet TAKE 1 TABLET DAILY AT 6 P.M. (Patient taking differently: Take 20 mg by mouth every evening.) 90 tablet 3   carvedilol (COREG) 12.5 MG tablet Take 1 tablet (12.5 mg total) by mouth 2 (two) times daily. 180 tablet 3   flecainide (TAMBOCOR) 100 MG tablet Take 1 tablet (100 mg total) by mouth 2 (two) times daily. 60 tablet 3   fluticasone (FLONASE) 50 MCG/ACT nasal spray Place 2 sprays into both nostrils daily. 16 g 2   fluticasone furoate-vilanterol (BREO ELLIPTA) 100-25 MCG/INH AEPB Inhale 1 puff into the lungs daily. 60 each 11   gabapentin (NEURONTIN) 300 MG capsule  TAKE 2 CAPSULES BY MOUTH AT BEDTIME 60 capsule 2   glucose blood (ACCU-CHEK AVIVA PLUS) test strip 1 each by Other route daily. Dx E11.9 100 each 12   montelukast (SINGULAIR) 10 MG tablet Take 10 mg by mouth at bedtime.      Multiple Vitamins-Minerals (MULTIVITAMIN WITH MINERALS) tablet Take 2 tablets by mouth daily. Gummies     RESTASIS 0.05 % ophthalmic emulsion Place 1 drop into both eyes 2 (two) times daily.     saccharomyces boulardii (FLORASTOR) 250 MG capsule Take 1 capsule (250 mg total) by mouth 2 (two) times daily. 180 capsule 0   torsemide (DEMADEX) 20 MG tablet Take 1 tablet (20 mg total) by mouth daily.     valsartan (DIOVAN) 160 MG tablet Take 1 tablet (160 mg total) by mouth daily. 90 tablet 3   amLODipine (NORVASC) 5 MG tablet Take 1 tablet (5 mg total) by mouth daily. 90 tablet 3   No facility-administered medications prior to visit.    No Known Allergies  ROS Review of Systems  Constitutional:  Positive for fatigue. Negative for fever.  HENT:  Positive for congestion and sore throat.   Respiratory:  Positive for cough. Negative for shortness of breath and wheezing.   Cardiovascular:  Negative for chest pain.     Objective:    Physical Exam Vitals reviewed.  Constitutional:      Appearance: Normal appearance.  HENT:     Ears:     Comments: He has some cerumen in both canals partially obscuring eardrum Cardiovascular:     Rate and Rhythm: Normal rate and regular rhythm.  Pulmonary:     Effort: Pulmonary effort is normal.     Breath sounds: Normal breath sounds. No wheezing or rales.  Musculoskeletal:     Cervical back: Neck supple.  Lymphadenopathy:     Cervical: No cervical adenopathy.  Neurological:     Mental Status: He is alert.    BP (!) 144/68 (BP Location: Left Arm, Patient Position: Sitting, Cuff Size: Normal)   Pulse (!) 56  Temp 97.9 F (36.6 C) (Oral)   Ht _0  (1.778 m)   Wt 185 lb 14.4 oz (84.3 kg)   SpO2 98%   BMI 26.67 kg/m  Wt  Readings from Last 3 Encounters:  01/30/21 185 lb 14.4 oz (84.3 kg)  01/05/21 191 lb 12.8 oz (87 kg)  12/31/20 189 lb 13.1 oz (86.1 kg)     Health Maintenance Due  Topic Date Due   FOOT EXAM  Never done   Hepatitis C Screening  Never done   OPHTHALMOLOGY EXAM  01/09/2020    There are no preventive care reminders to display for this patient.  Lab Results  Component Value Date   TSH 0.912 01/29/2020   Lab Results  Component Value Date   WBC 4.7 01/06/2021   HGB 9.7 (L) 01/06/2021   HCT 29.1 (L) 01/06/2021   MCV 90.9 01/06/2021   PLT 225.0 01/06/2021   Lab Results  Component Value Date   NA 133 (L) 01/06/2021   K 4.1 01/06/2021   CO2 25 01/06/2021   GLUCOSE 99 01/06/2021   BUN 16 01/06/2021   CREATININE 1.24 01/06/2021   BILITOT 0.9 01/06/2021   ALKPHOS 76 01/06/2021   AST 36 01/06/2021   ALT 31 01/06/2021   PROT 6.2 01/06/2021   ALBUMIN 3.8 01/06/2021   CALCIUM 8.9 01/06/2021   ANIONGAP 7 01/01/2021   EGFR 48 (L) 11/28/2020   GFR 55.88 (L) 01/06/2021   Lab Results  Component Value Date   CHOL 120 05/20/2020   Lab Results  Component Value Date   HDL 46 05/20/2020   Lab Results  Component Value Date   LDLCALC 59 05/20/2020   Lab Results  Component Value Date   TRIG 73 05/20/2020   Lab Results  Component Value Date   CHOLHDL 2.6 05/20/2020   Lab Results  Component Value Date   HGBA1C 6.3 (H) 12/30/2020      Assessment & Plan:   Problem List Items Addressed This Visit   None Visit Diagnoses     Acute cough    -  Primary   Relevant Orders   POC COVID-19   POCT Influenza A/B   Nasal congestion       Relevant Orders   POC COVID-19   POCT Influenza A/B     COVID and influenza screens negative.  We explained this may all be viral.  However, he has had proclivity toward pneumonia in the past and has had some increasingly productive cough past couple of days.  We will go ahead and start doxycycline 100 mg twice daily for 7 days.  Follow-up  for any fever, dyspnea, or other concerns  Meds ordered this encounter  Medications   doxycycline (VIBRAMYCIN) 100 MG capsule    Sig: Take 1 capsule (100 mg total) by mouth 2 (two) times daily.    Dispense:  14 capsule    Refill:  0    Follow-up: No follow-ups on file.    Carolann Littler, MD

## 2021-02-07 ENCOUNTER — Telehealth: Payer: Self-pay

## 2021-02-07 MED ORDER — AZITHROMYCIN 250 MG PO TABS: ORAL_TABLET | ORAL | 0 refills | Status: AC

## 2021-02-07 NOTE — Telephone Encounter (Signed)
Please advise 

## 2021-02-07 NOTE — Telephone Encounter (Signed)
Patient seen 01/30/21 by Dr Elease Hashimoto.  Please advise.

## 2021-02-07 NOTE — Addendum Note (Signed)
Addended by: Westley Hummer B on: 02/07/2021 01:14 PM   Modules accepted: Orders

## 2021-02-07 NOTE — Telephone Encounter (Signed)
Patient called back stating he feels worst than he did before and would like another Rx sent to the pharmacy.

## 2021-02-07 NOTE — Telephone Encounter (Signed)
Rx sent and patient is aware. 

## 2021-02-17 ENCOUNTER — Other Ambulatory Visit: Payer: Self-pay | Admitting: Cardiology

## 2021-03-12 ENCOUNTER — Encounter (HOSPITAL_BASED_OUTPATIENT_CLINIC_OR_DEPARTMENT_OTHER): Payer: Self-pay | Admitting: Emergency Medicine

## 2021-03-12 ENCOUNTER — Emergency Department (HOSPITAL_BASED_OUTPATIENT_CLINIC_OR_DEPARTMENT_OTHER)
Admission: EM | Admit: 2021-03-12 | Discharge: 2021-03-12 | Disposition: A | Discharge status: Home / Self Care | Payer: Medicare Other | Attending: Emergency Medicine | Admitting: Emergency Medicine

## 2021-03-12 ENCOUNTER — Emergency Department (HOSPITAL_BASED_OUTPATIENT_CLINIC_OR_DEPARTMENT_OTHER): Payer: Medicare Other

## 2021-03-12 DIAGNOSIS — Z7901 Long term (current) use of anticoagulants: Secondary | ICD-10-CM | POA: Insufficient documentation

## 2021-03-12 DIAGNOSIS — X58XXXA Exposure to other specified factors, initial encounter: Secondary | ICD-10-CM | POA: Diagnosis not present

## 2021-03-12 DIAGNOSIS — Z7951 Long term (current) use of inhaled steroids: Secondary | ICD-10-CM | POA: Insufficient documentation

## 2021-03-12 DIAGNOSIS — Z79899 Other long term (current) drug therapy: Secondary | ICD-10-CM | POA: Insufficient documentation

## 2021-03-12 DIAGNOSIS — S61216A Laceration without foreign body of right little finger without damage to nail, initial encounter: Secondary | ICD-10-CM | POA: Diagnosis not present

## 2021-03-12 DIAGNOSIS — S6991XA Unspecified injury of right wrist, hand and finger(s), initial encounter: Secondary | ICD-10-CM | POA: Diagnosis present

## 2021-03-12 IMAGING — DX DG HAND COMPLETE 3+V*R*
3 series · 3 of 3 positions shown · non-contrast
Comparison: [DATE]

CLINICAL DATA: Laceration.

EXAM:
RIGHT HAND - COMPLETE 3+ VIEW

[hand pa]
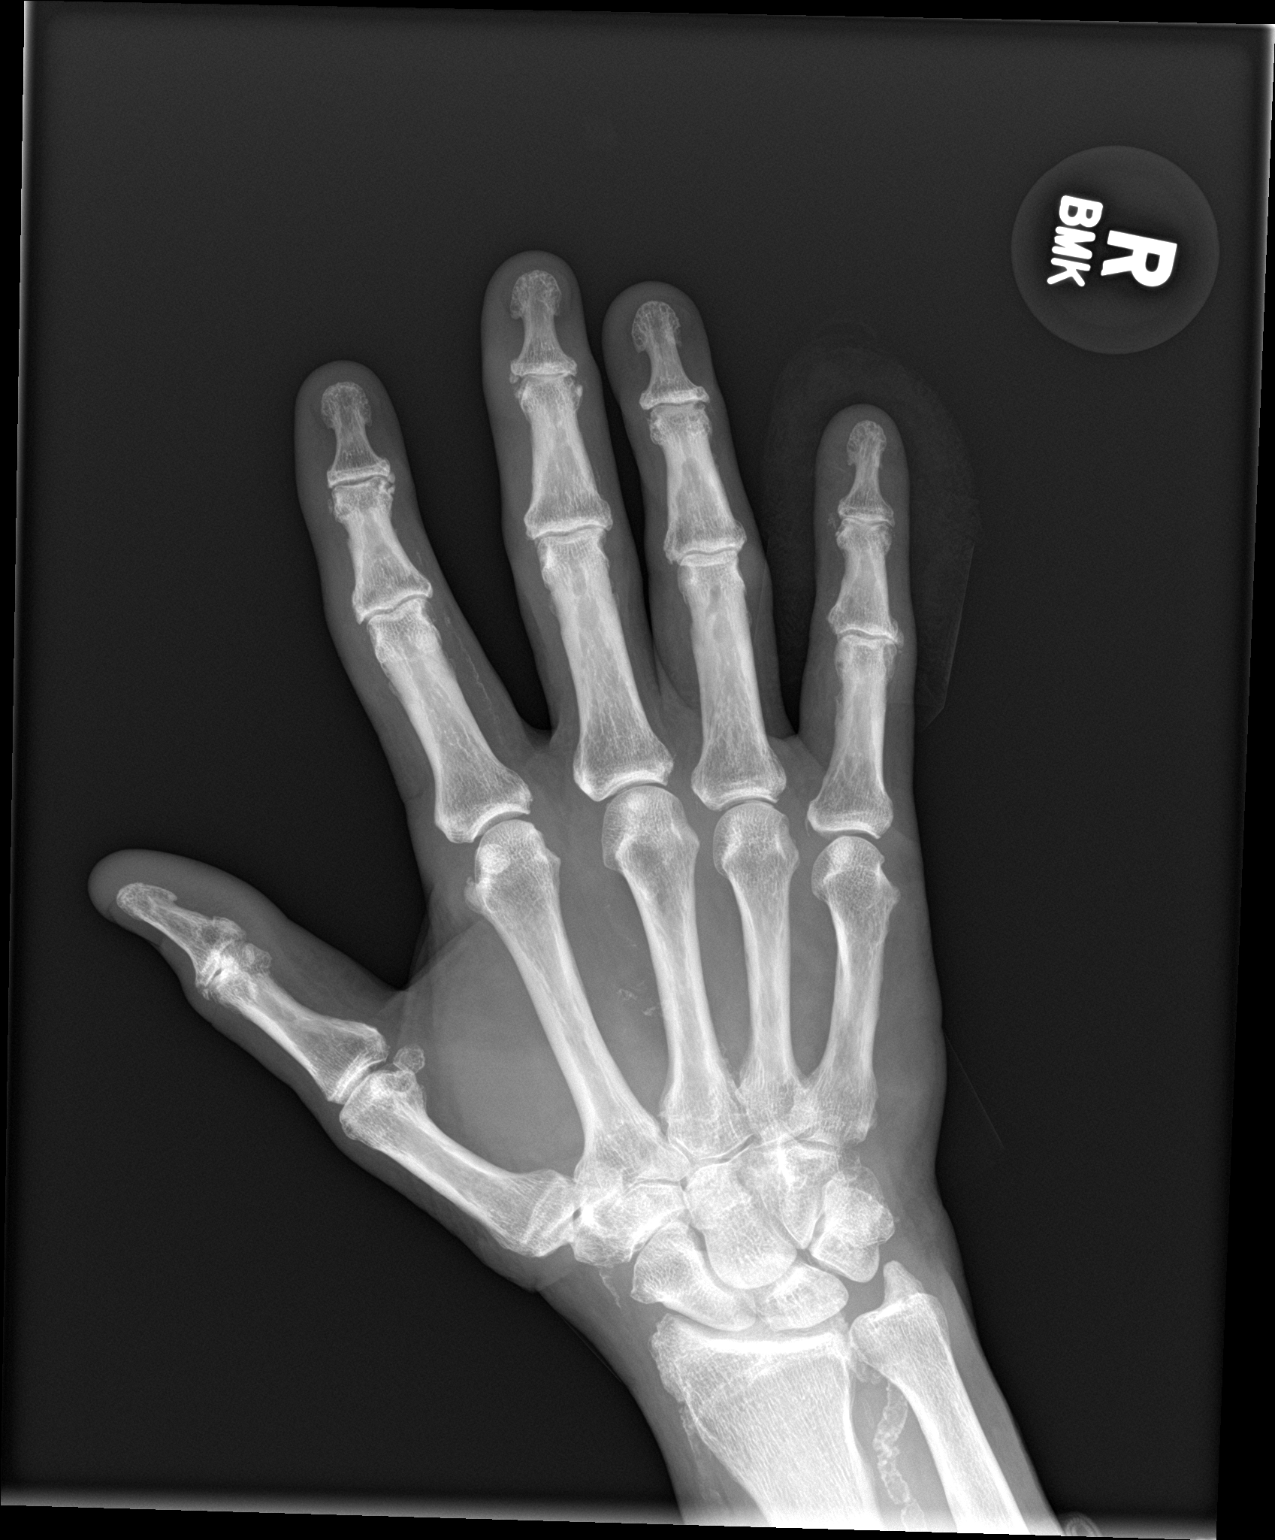

[hand obl]
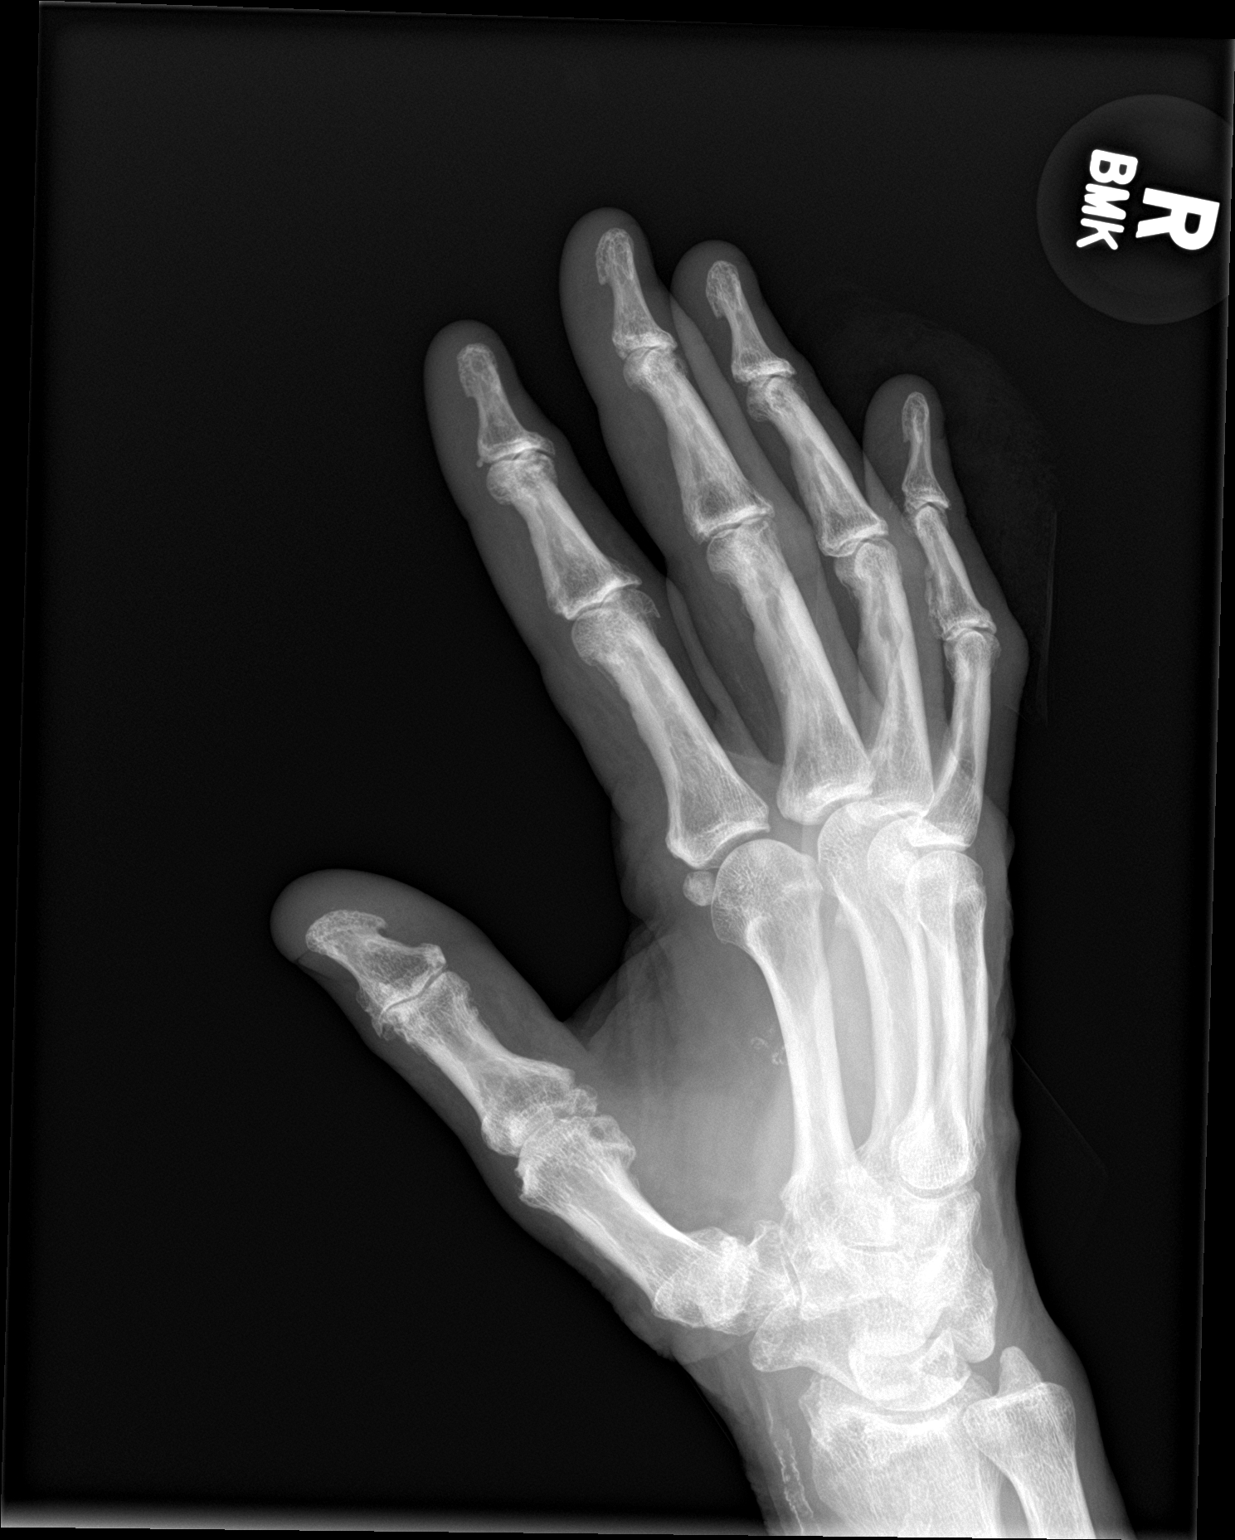

[hand lat]
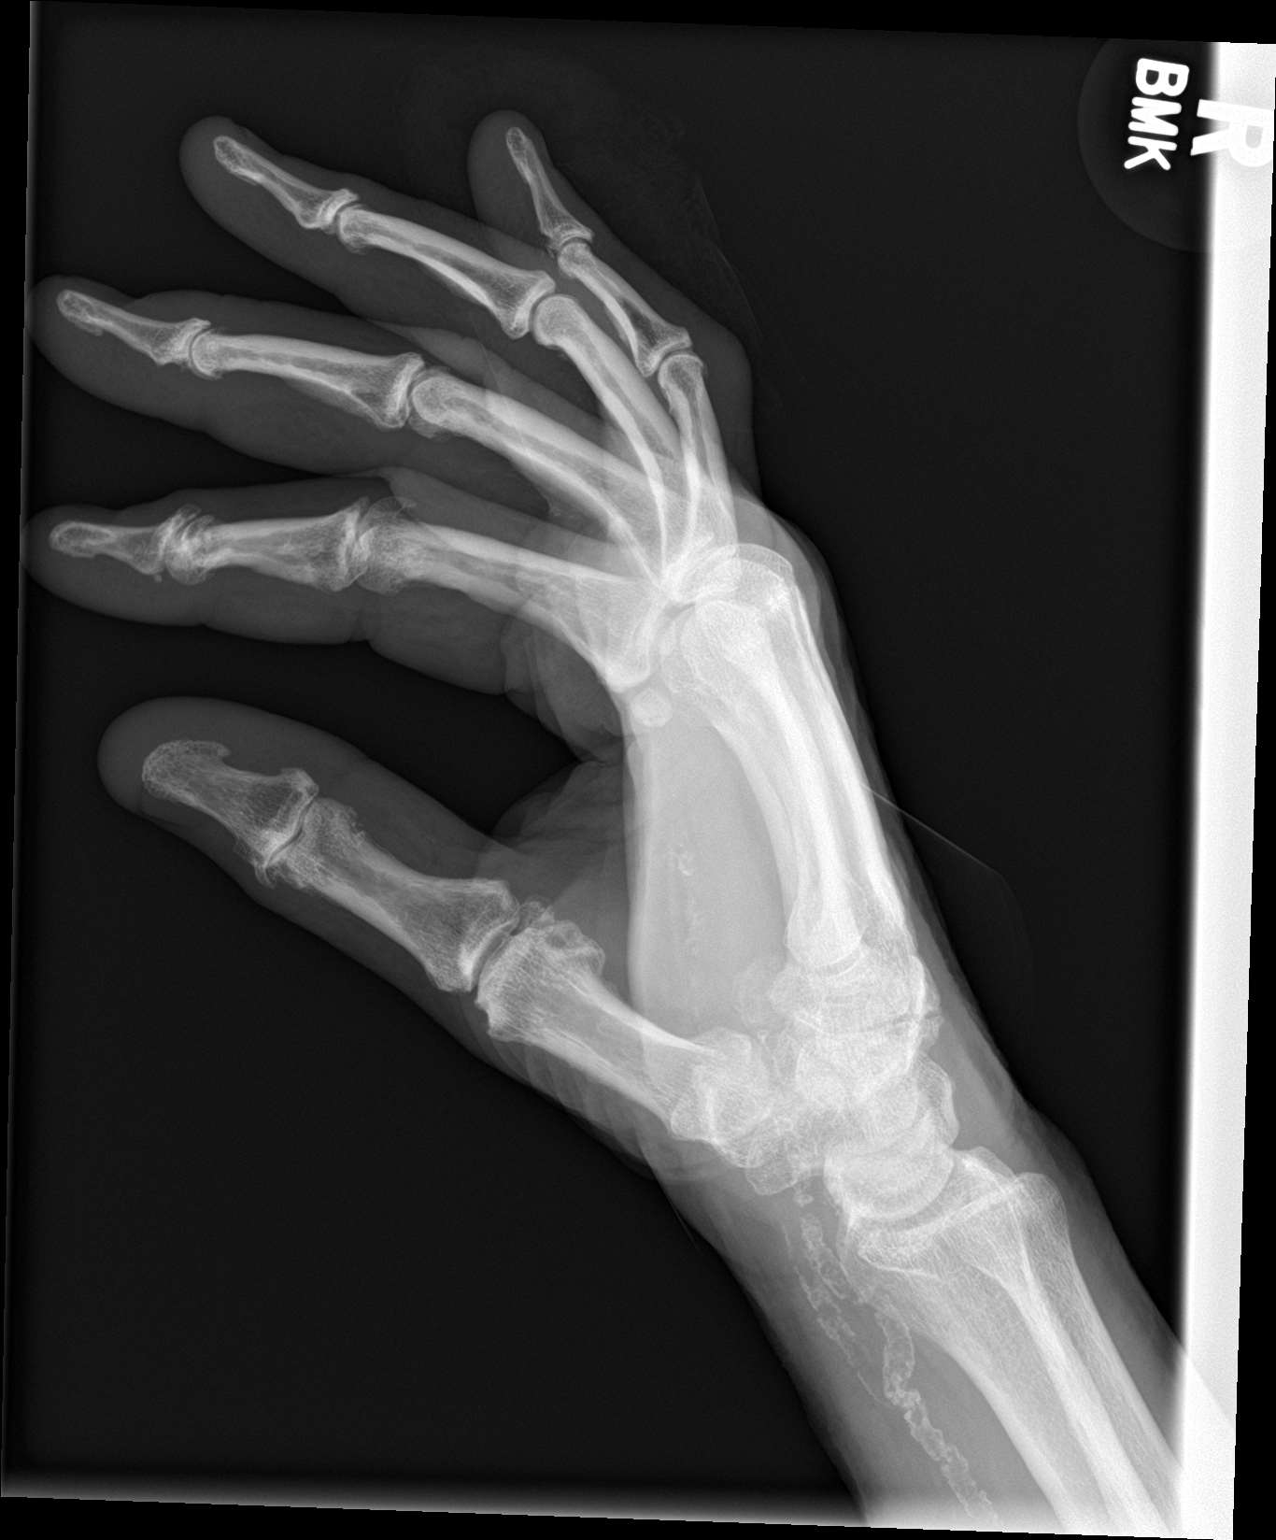

[3 of 3 positions shown; findings below may reference images not displayed]

FINDINGS: There is no evidence of fracture or dislocation. Osteoarthritic
changes at the D IP joints. Soft tissue injury to the fifth digit.
IMPRESSION: Soft tissue injury to the fifth digit.

## 2021-03-12 MED ORDER — LIDOCAINE HCL (PF) 1 % IJ SOLN
10.0000 mL | Freq: Once | INTRAMUSCULAR | Status: AC
  Administered 2021-03-12: 10 mL
  Filled 2021-03-12: qty 10

## 2021-03-12 NOTE — ED Triage Notes (Signed)
Pt arrives pov with c/o right 5th digit laceration of hand on metal shelf. Pt endorses thinners, bleeding controlled, bandage applied in triage

## 2021-03-12 NOTE — Discharge Instructions (Signed)
Wash the wound with soap and water.  Apply topical antibiotic ointment like mupirocin cream or Polysporin to the cut and then wrapped with Band-Aid.  Have the sutures removed in 10 to 14 days by your primary care.  Return if you have fevers, spreading redness, pus or other signs of infection to the wound.

## 2021-03-12 NOTE — ED Provider Notes (Signed)
[facility] EMERGENCY DEPARTMENT Provider Note   CSN: [id number] Arrival date & time: 03/12/21  1254     History  Chief Complaint  Patient presents with   Extremity Laceration    Charles Lloyd. is a 81 y.o. male.  HPI  This is a 81 year old male on Eliquis presenting to right fifth digit extremity lack.  Started acutely today when he was moving shells, bleeding is controlled with pressure.  His last tetanus was 1 year ago.  Denies any numbness or tingling to the extremity, no pain to the hand elsewhere.  Moves the hand without any difficulties.  Home Medications Prior to Admission medications   Medication Sig Start Date End Date Taking? Authorizing Provider  albuterol (VENTOLIN HFA) 108 (90 Base) MCG/ACT inhaler Inhale 1-2 puffs into the lungs every 6 (six) hours as needed for wheezing or shortness of breath. 10/05/20   Parrett, Fonnie Mu, NP  amLODipine (NORVASC) 5 MG tablet Take 1 tablet (5 mg total) by mouth daily. 06/10/20 12/31/20  Richardo Priest, MD  amoxicillin (AMOXIL) 500 MG capsule Take 2,000 mg by mouth once as needed (prior to dental procedure.). 01/20/20   [provider]  apixaban (ELIQUIS) 5 MG TABS tablet Take 1 tablet (5 mg total) by mouth 2 (two) times daily. 12/15/20   Isaac Bliss, [name], MD  atorvastatin (LIPITOR) 20 MG tablet TAKE 1 TABLET DAILY AT 6 P.M. Patient taking differently: Take 20 mg by mouth every evening. 03/22/20   Isaac Bliss, [name], MD  carvedilol (COREG) 12.5 MG tablet Take 1 tablet (12.5 mg total) by mouth 2 (two) times daily. 06/16/20 03/04/21  Tobb, Kardie, DO  doxycycline (VIBRAMYCIN) 100 MG capsule Take 1 capsule (100 mg total) by mouth 2 (two) times daily. 01/30/21   Burchette, Alinda Sierras, MD  flecainide (TAMBOCOR) 100 MG tablet TAKE 1 TABLET BY MOUTH TWICE A DAY 02/17/21   Camnitz, Will Hassell Done, MD  fluticasone [facility]) 50 MCG/ACT nasal spray Place 2 sprays into both nostrils daily. 01/07/19   Dana Allan  I, MD  fluticasone furoate-vilanterol (BREO ELLIPTA) 100-25 MCG/INH AEPB Inhale 1 puff into the lungs daily. 10/05/20   Parrett, Fonnie Mu, NP  gabapentin (NEURONTIN) 300 MG capsule TAKE 2 CAPSULES BY MOUTH AT BEDTIME 01/11/21   Isaac Bliss, [name], MD  glucose blood (ACCU-CHEK AVIVA PLUS) test strip 1 each by Other route daily. Dx E11.9 03/25/20   Isaac Bliss, [name], MD  montelukast (SINGULAIR) 10 MG tablet Take 10 mg by mouth at bedtime.     [provider]  Multiple Vitamins-Minerals (MULTIVITAMIN WITH MINERALS) tablet Take 2 tablets by mouth daily. Gummies    [provider]  RESTASIS 0.05 % ophthalmic emulsion Place 1 drop into both eyes 2 (two) times daily. 12/09/19   [provider]  saccharomyces boulardii (FLORASTOR) 250 MG capsule Take 1 capsule (250 mg total) by mouth 2 (two) times daily. 11/23/20   Isaac Bliss, [name], MD  torsemide (DEMADEX) 20 MG tablet Take 1 tablet (20 mg total) by mouth daily. 01/05/21   Isaac Bliss, [name], MD  valsartan (DIOVAN) 160 MG tablet Take 1 tablet (160 mg total) by mouth daily. 01/05/21   Isaac Bliss, [name], MD      Allergies    Patient has no known allergies.    Review of Systems   Review of Systems  Constitutional:  Negative for fever.  Skin:  Positive for wound.   Physical Exam Updated Vital  Signs BP (!) 149/76 (BP Location: Left Arm)    Pulse 77    Temp 97.9 F (36.6 C) (Oral)    Resp 16    Ht 5\' 10"  (1.778 m)    Wt 81.6 kg    SpO2 100%    BMI 25.83 kg/m  Physical Exam Vitals and nursing note reviewed. Exam conducted with a chaperone present.  Constitutional:      General: He is not in acute distress.    Appearance: Normal appearance.  HENT:     Head: Normocephalic and atraumatic.  Eyes:     General: No scleral icterus.    Extraocular Movements: Extraocular movements intact.     Pupils: Pupils are equal, round, and reactive to light.  Cardiovascular:     Pulses: Normal  pulses.  Musculoskeletal:        General: Tenderness present. Normal range of motion.  Skin:    Capillary Refill: Capillary refill takes less than 2 seconds.     Coloration: Skin is not jaundiced.     Comments: She centimeter laceration to the fifth digit palmar side.  No nailbed involvement.  Neurological:     Mental Status: He is alert. Mental status is at baseline.     Coordination: Coordination normal.   ED Results / Procedures / Treatments   Labs (all labs ordered are listed, but only abnormal results are displayed) Labs Reviewed - No data to display  EKG None  Radiology DG Hand Complete Right  Result Date: 03/12/2021 CLINICAL DATA:  Laceration. EXAM: RIGHT HAND - COMPLETE 3+ VIEW COMPARISON:  December 30, 2020 FINDINGS: There is no evidence of fracture or dislocation. Osteoarthritic changes at the D IP joints. Soft tissue injury to the fifth digit. IMPRESSION: Soft tissue injury to the fifth digit. Electronically Signed   By: Fidela Salisbury M.D.   On: 03/12/2021 15:27    Procedures .Marland KitchenLaceration Repair  Date/Time: 03/12/2021 5:06 PM Performed by: Sherrill Raring, PA-C Authorized by: Sherrill Raring, PA-C   Consent:    Consent obtained:  Verbal   Consent given by:  Patient   Risks discussed:  Infection, need for additional repair, pain, poor cosmetic result and poor wound healing   Alternatives discussed:  No treatment and delayed treatment Universal protocol:    Procedure explained and questions answered to patient or proxy's satisfaction: yes     Relevant documents present and verified: yes     Test results available: yes     Imaging studies available: yes     Required blood products, implants, devices, and special equipment available: yes     Site/side marked: yes     Immediately prior to procedure, a time out was called: yes     Patient identity confirmed:  Verbally with patient Anesthesia:    Anesthesia method:  Local infiltration   Local anesthetic:  Lidocaine 1%  w/o epi Laceration details:    Location:  Finger   Finger location:  R small finger   Length (cm):  2   Depth (mm):  2 Pre-procedure details:    Preparation:  Imaging obtained to evaluate for foreign bodies and patient was prepped and draped in usual sterile fashion Exploration:    Hemostasis achieved with:  Direct pressure   Imaging obtained: x-ray     Imaging outcome: foreign body not noted     Wound exploration: wound explored through full range of motion and entire depth of wound visualized     Contaminated: no   Treatment:  Area cleansed with:  Povidone-iodine   Amount of cleaning:  Standard   Irrigation solution:  Sterile saline   Irrigation volume:  250   Irrigation method:  Pressure wash   Visualized foreign bodies/material removed: no   Skin repair:    Repair method:  Sutures   Suture size:  4-0   Suture material:  Prolene   Suture technique:  Simple interrupted   Number of sutures:  5 Approximation:    Approximation:  Close Repair type:    Repair type:  Simple Post-procedure details:    Dressing:  Open (no dressing)   Procedure completion:  Tolerated well, no immediate complications    Medications Ordered in ED Medications  lidocaine (PF) (XYLOCAINE) 1 % injection 10 mL (has no administration in time range)    ED Course/ Medical Decision Making/ A&P                           Medical Decision Making  This is a 81 year old male presenting due to laceration to right little finger.  He is neurovascular intact with good cap refill and strong radial pulse.  Some swelling, no bony tenderness or paresthesias.  Hemostasis achieved with direct pressure, tetanus is up-to-date.  Laceration repair completed without any complications, patient tolerated very well.  Wound reexplored through range of motion after repair without any deficits, sensation still intact with good cap refill.  We discussed return precautions and wound care, do not think antibiotics are necessary  given not immunocompromised and clean injury.  He will follow-up with his PCP in 10 to 14 days to have the sutures removed.  Patient was discharged in stable condition.        Final Clinical Impression(s) / ED Diagnoses Final diagnoses:  None    Rx / DC Orders ED Discharge Orders     None         Sherrill Raring, [place] 03/12/21 1708    Tegeler, Gwenyth Allegra, MD 03/12/21 1752

## 2021-03-16 ENCOUNTER — Other Ambulatory Visit: Payer: Self-pay | Admitting: Internal Medicine

## 2021-04-04 ENCOUNTER — Telehealth: Payer: Self-pay | Admitting: Cardiology

## 2021-04-04 NOTE — Telephone Encounter (Signed)
° °  Pt c/o medication issue:  1. Name of Medication: torsemide (DEMADEX) 20 MG tablet  2. How are you currently taking this medication (dosage and times per day)? Take 1 tablet (20 mg total) by mouth daily.  3. Are you having a reaction (difficulty breathing--STAT)?   4. What is your medication issue? Pt said, this meds stopped working, his swelling doubled the size after a few days

## 2021-04-04 NOTE — Telephone Encounter (Signed)
Patient informed of and is agreeable to Dr. Joya Gaskins recommendation. Patient had no further questions at this time.

## 2021-04-16 ENCOUNTER — Other Ambulatory Visit: Payer: Self-pay | Admitting: Internal Medicine

## 2021-04-16 DIAGNOSIS — G6289 Other specified polyneuropathies: Secondary | ICD-10-CM

## 2021-05-09 ENCOUNTER — Encounter (HOSPITAL_BASED_OUTPATIENT_CLINIC_OR_DEPARTMENT_OTHER): Payer: Self-pay | Admitting: Emergency Medicine

## 2021-05-09 ENCOUNTER — Other Ambulatory Visit: Payer: Self-pay

## 2021-05-09 ENCOUNTER — Emergency Department (HOSPITAL_BASED_OUTPATIENT_CLINIC_OR_DEPARTMENT_OTHER): Payer: Medicare Other

## 2021-05-09 ENCOUNTER — Emergency Department (HOSPITAL_BASED_OUTPATIENT_CLINIC_OR_DEPARTMENT_OTHER)
Admission: EM | Admit: 2021-05-09 | Discharge: 2021-05-09 | Disposition: A | Discharge status: Home / Self Care | Payer: Medicare Other | Attending: Emergency Medicine | Admitting: Emergency Medicine

## 2021-05-09 ENCOUNTER — Ambulatory Visit: Payer: Medicare Other | Admitting: Cardiology

## 2021-05-09 DIAGNOSIS — Z8546 Personal history of malignant neoplasm of prostate: Secondary | ICD-10-CM | POA: Diagnosis not present

## 2021-05-09 DIAGNOSIS — M7021 Olecranon bursitis, right elbow: Secondary | ICD-10-CM

## 2021-05-09 DIAGNOSIS — T148XXA Other injury of unspecified body region, initial encounter: Secondary | ICD-10-CM

## 2021-05-09 DIAGNOSIS — I110 Hypertensive heart disease with heart failure: Secondary | ICD-10-CM | POA: Diagnosis not present

## 2021-05-09 DIAGNOSIS — Z79899 Other long term (current) drug therapy: Secondary | ICD-10-CM | POA: Diagnosis not present

## 2021-05-09 DIAGNOSIS — S5001XA Contusion of right elbow, initial encounter: Secondary | ICD-10-CM | POA: Diagnosis not present

## 2021-05-09 DIAGNOSIS — Y9389 Activity, other specified: Secondary | ICD-10-CM | POA: Diagnosis not present

## 2021-05-09 DIAGNOSIS — Z85828 Personal history of other malignant neoplasm of skin: Secondary | ICD-10-CM | POA: Insufficient documentation

## 2021-05-09 DIAGNOSIS — W01198A Fall on same level from slipping, tripping and stumbling with subsequent striking against other object, initial encounter: Secondary | ICD-10-CM | POA: Diagnosis not present

## 2021-05-09 DIAGNOSIS — E119 Type 2 diabetes mellitus without complications: Secondary | ICD-10-CM | POA: Insufficient documentation

## 2021-05-09 DIAGNOSIS — I509 Heart failure, unspecified: Secondary | ICD-10-CM | POA: Diagnosis not present

## 2021-05-09 DIAGNOSIS — W19XXXA Unspecified fall, initial encounter: Secondary | ICD-10-CM

## 2021-05-09 DIAGNOSIS — Z7901 Long term (current) use of anticoagulants: Secondary | ICD-10-CM | POA: Diagnosis not present

## 2021-05-09 DIAGNOSIS — S59901A Unspecified injury of right elbow, initial encounter: Secondary | ICD-10-CM | POA: Diagnosis present

## 2021-05-09 DIAGNOSIS — I4891 Unspecified atrial fibrillation: Secondary | ICD-10-CM | POA: Insufficient documentation

## 2021-05-09 IMAGING — DX DG ELBOW COMPLETE 3+V*R*
4 series · 4 of 4 positions shown · non-contrast
Comparison: None.

CLINICAL DATA: Trauma, fall, pain

EXAM:
RIGHT ELBOW - COMPLETE 3+ VIEW

[elbow ap]
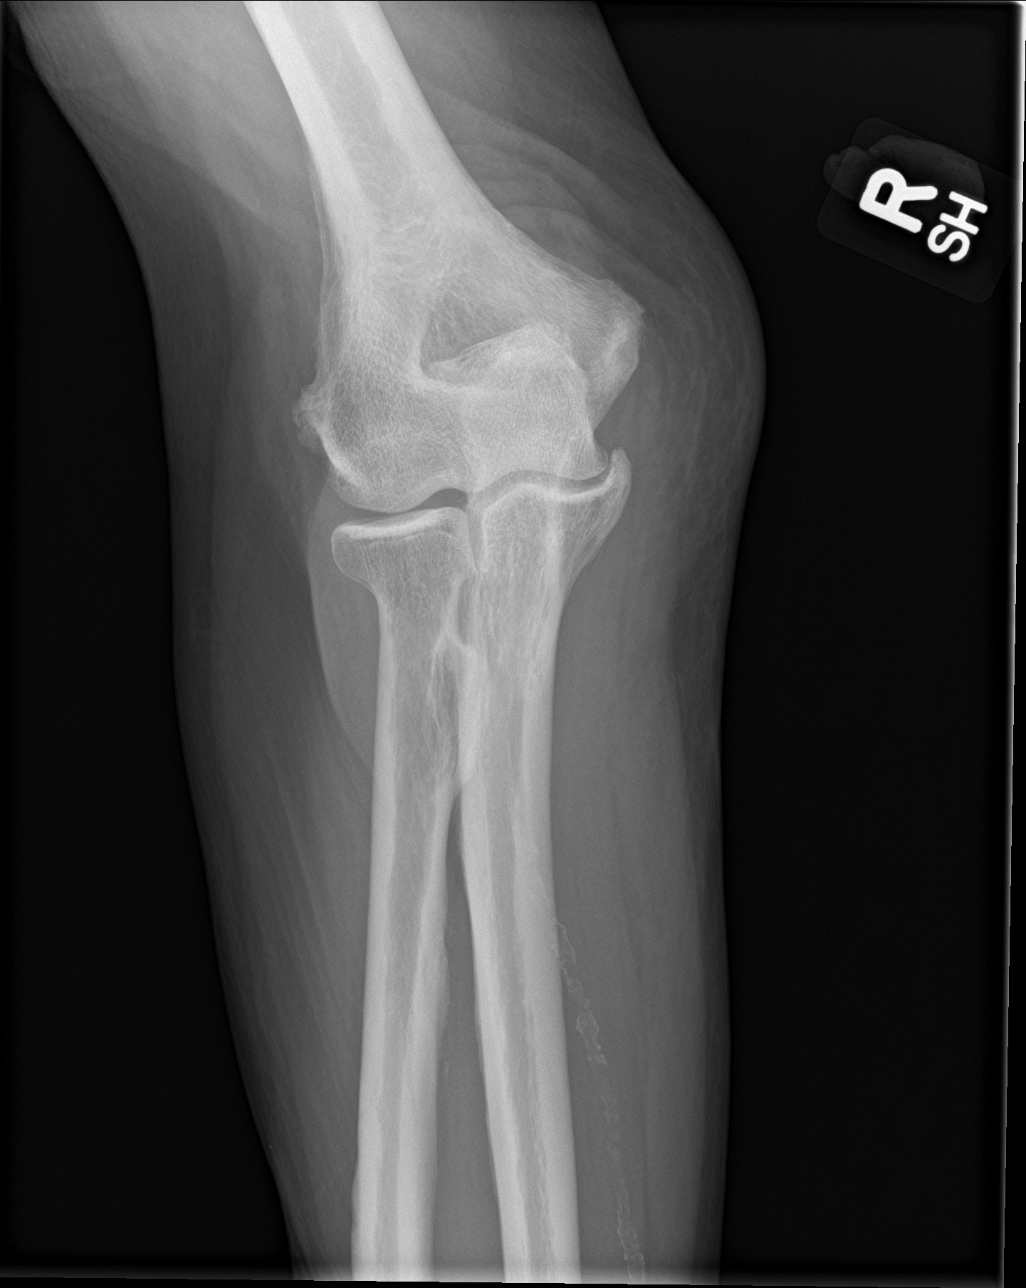

[elbow obl (1 of 2)]
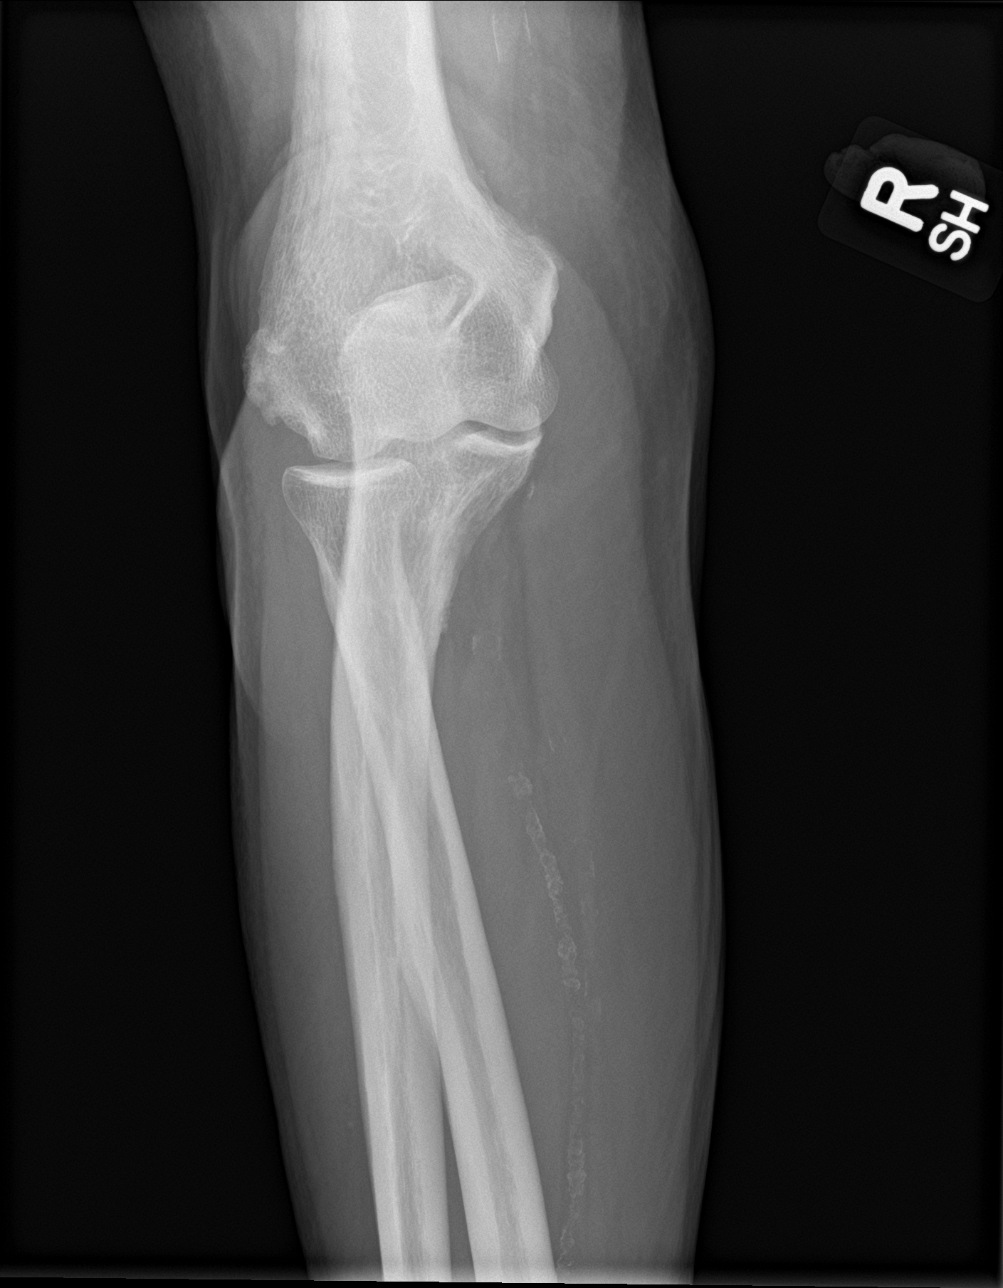

[elbow obl (2 of 2)]
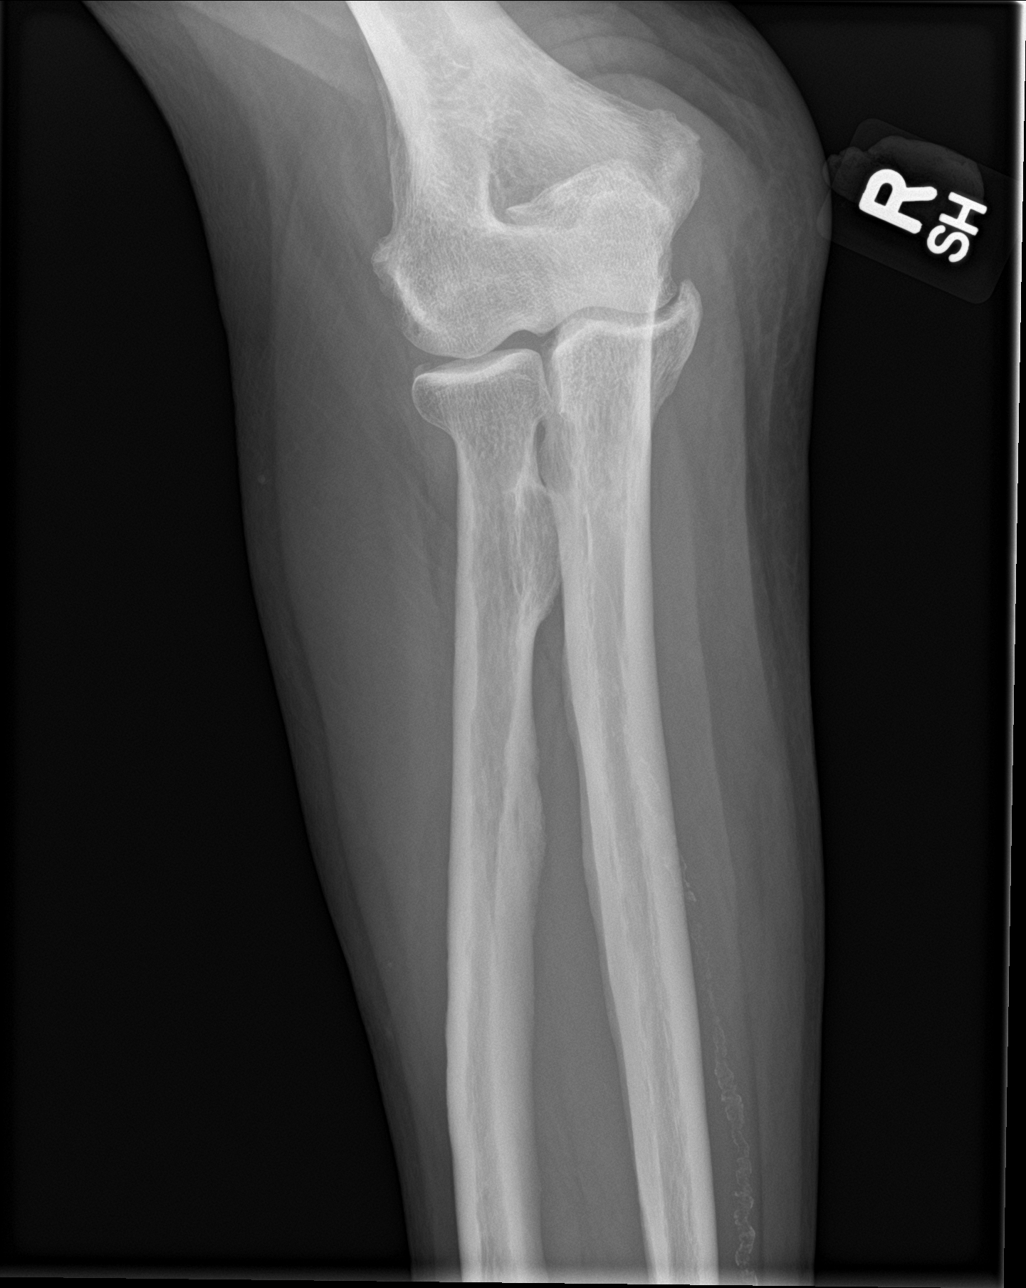

[elbow lat]
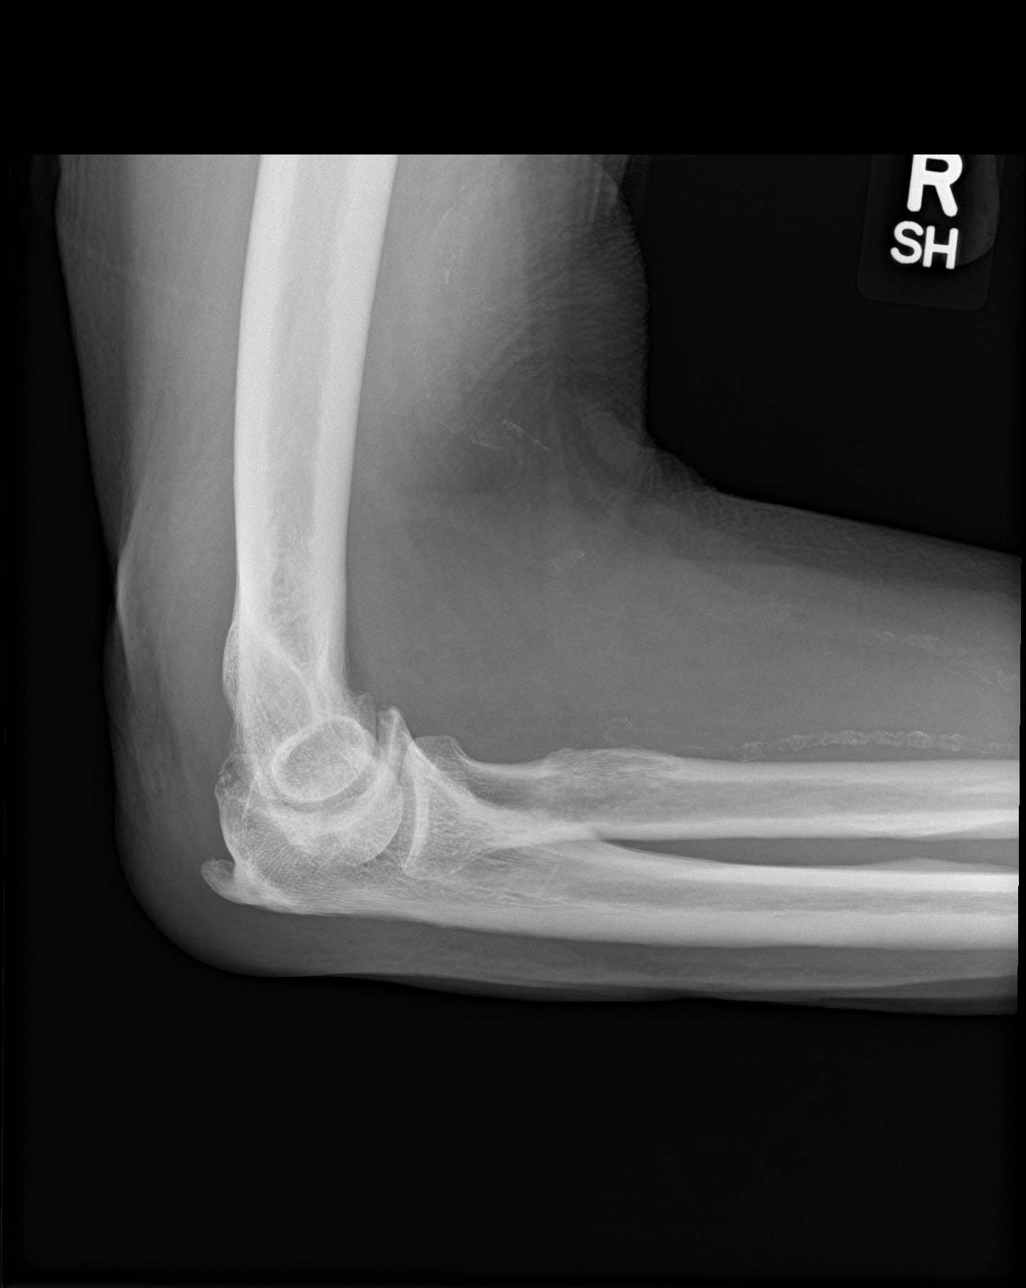

[4 of 4 positions shown; findings below may reference images not displayed]

FINDINGS: No recent fracture or dislocation is seen. There is no displacement
of posterior fat pad. Degenerative changes are noted with bony
spurs. There is smooth marginated calcification at the attachment of
triceps to the olecranon, possibly suggesting calcific tendinosis.
There is soft tissue swelling over the olecranon process which may
suggest hematoma or bursitis. There is subcutaneous stranding along
the medial aspect of right elbow suggesting contusion/hematoma.
There are scattered arterial calcifications in the soft tissues.
IMPRESSION: No recent fracture or dislocation is seen in the right elbow.
Degenerative changes are noted, particularly in the proximal ulna.

There is soft tissue swelling over the olecranon process suggesting
hematoma or bursitis. There is stranding in the subcutaneous plane
along the medial aspect suggesting contusion/hematoma.

## 2021-05-09 NOTE — ED Notes (Signed)
AVS provided to client from ED MD, discussed pain control, also provided information on when the need would require him to seek care from his primary MD or the need to return to the ED. Opportunity for questions provided prior to DC to home. States he does not have pain at this time at Rt elbow, but his tender to the touch.  ?

## 2021-05-09 NOTE — ED Provider Notes (Signed)
?[facility] ?Provider Note ? ? ?CSN: [id number] ?Arrival date & time: 05/09/21  1019 ? ?  ? ?History ? ?Chief Complaint  ?Patient presents with  ? Fall  ? ? ?Charles Lloyd. is a 81 y.o. male. ? ?Pt is a 81 yo male who has a pmhx ofchf, htn, afib on Eliquis, hyperlipidemia, dm2, basal cell carcinoma face, Grover's disease, prostate cancer, and vitamin D deficiency.  Pt said he fell yesterday and hit his right elbow.  He said it did not hurt initially, but he woke up and had some pain and swelling.  He denies hitting his head or having a loc. ? ? ?  ? ?Home Medications ?Prior to Admission medications   ?Medication Sig Start Date End Date Taking? Authorizing Provider  ?atorvastatin (LIPITOR) 20 MG tablet Take 1 tablet (20 mg total) by mouth daily. 03/16/21   Isaac Bliss, [name], MD  ?albuterol (VENTOLIN HFA) 108 (90 Base) MCG/ACT inhaler Inhale 1-2 puffs into the lungs every 6 (six) hours as needed for wheezing or shortness of breath. 10/05/20   Parrett, Fonnie Mu, NP  ?amLODipine (NORVASC) 5 MG tablet Take 1 tablet (5 mg total) by mouth daily. 06/10/20 12/31/20  Richardo Priest, MD  ?amoxicillin (AMOXIL) 500 MG capsule Take 2,000 mg by mouth once as needed (prior to dental procedure.). 01/20/20   [provider]  ?apixaban (ELIQUIS) 5 MG TABS tablet Take 1 tablet (5 mg total) by mouth 2 (two) times daily. 12/15/20   Isaac Bliss, [name], MD  ?carvedilol (COREG) 12.5 MG tablet Take 1 tablet (12.5 mg total) by mouth 2 (two) times daily. 06/16/20 03/04/21  Tobb, Godfrey Pick, DO  ?doxycycline (VIBRAMYCIN) 100 MG capsule Take 1 capsule (100 mg total) by mouth 2 (two) times daily. 01/30/21   Burchette, Alinda Sierras, MD  ?flecainide (TAMBOCOR) 100 MG tablet TAKE 1 TABLET BY MOUTH TWICE A DAY 02/17/21   Camnitz, Ocie Doyne, MD  ?fluticasone (FLONASE) 50 MCG/ACT nasal spray Place 2 sprays into both nostrils daily. 01/07/19   Bonnell Public, MD  ?fluticasone furoate-vilanterol (BREO  ELLIPTA) 100-25 MCG/INH AEPB Inhale 1 puff into the lungs daily. 10/05/20   Parrett, Fonnie Mu, NP  ?gabapentin (NEURONTIN) 300 MG capsule TAKE 2 CAPSULES BY MOUTH AT BEDTIME 04/17/21   Isaac Bliss, [name], MD  ?glucose blood (ACCU-CHEK AVIVA PLUS) test strip 1 each by Other route daily. Dx E11.9 03/25/20   Isaac Bliss, [name], MD  ?montelukast (SINGULAIR) 10 MG tablet Take 10 mg by mouth at bedtime.     [provider]  ?Multiple Vitamins-Minerals (MULTIVITAMIN WITH MINERALS) tablet Take 2 tablets by mouth daily. Gummies    [provider]  ?RESTASIS 0.05 % ophthalmic emulsion Place 1 drop into both eyes 2 (two) times daily. 12/09/19   [provider]  ?saccharomyces boulardii (FLORASTOR) 250 MG capsule Take 1 capsule (250 mg total) by mouth 2 (two) times daily. 11/23/20   Isaac Bliss, [name], MD  ?torsemide (DEMADEX) 20 MG tablet Take 1 tablet (20 mg total) by mouth daily. 01/05/21   Isaac Bliss, [name], MD  ?valsartan (DIOVAN) 160 MG tablet Take 1 tablet (160 mg total) by mouth daily. 01/05/21   Isaac Bliss, [name], MD  ?   ? ?Allergies    ?Patient has no known allergies.   ? ?Review of Systems   ?Review of Systems  ?Musculoskeletal:   ?     Right elbow pain  ?All other  systems reviewed and are negative. ? ?Physical Exam ?Updated Vital Signs ?BP (!) 96/55 (BP Location: Left Arm)   Pulse 64   Temp 98.1 ?F (36.7 ?C) (Oral)   Resp 16   Ht '5\' 10"'$  (1.778 m)   Wt 83 kg   SpO2 91%   BMI 26.26 kg/m?  ?Physical Exam ?Vitals and nursing note reviewed.  ?Constitutional:   ?   Appearance: Normal appearance.  ?HENT:  ?   Head: Normocephalic and atraumatic.  ?   Right Ear: External ear normal.  ?   Left Ear: External ear normal.  ?   Nose: Nose normal.  ?   Mouth/Throat:  ?   Mouth: Mucous membranes are moist.  ?   Pharynx: Oropharynx is clear.  ?Eyes:  ?   Extraocular Movements: Extraocular movements intact.  ?   Conjunctiva/sclera: Conjunctivae normal.  ?    Pupils: Pupils are equal, round, and reactive to light.  ?Cardiovascular:  ?   Rate and Rhythm: Normal rate and regular rhythm.  ?   Pulses: Normal pulses.  ?   Heart sounds: Normal heart sounds.  ?Pulmonary:  ?   Effort: Pulmonary effort is normal.  ?   Breath sounds: Normal breath sounds.  ?Abdominal:  ?   General: Abdomen is flat. Bowel sounds are normal.  ?   Palpations: Abdomen is soft.  ?Musculoskeletal:     ?   General: Normal range of motion.  ?   Cervical back: Normal range of motion and neck supple.  ?   Comments: + bursitis/hematoma right olecranon  ?Skin: ?   General: Skin is warm.  ?   Capillary Refill: Capillary refill takes less than 2 seconds.  ?Neurological:  ?   General: No focal deficit present.  ?   Mental Status: He is alert and oriented to person, place, and time.  ?Psychiatric:     ?   Mood and Affect: Mood normal.     ?   Behavior: Behavior normal.  ? ? ?ED Results / Procedures / Treatments   ?Labs ?(all labs ordered are listed, but only abnormal results are displayed) ?Labs Reviewed - No data to display ? ?EKG ?None ? ?Radiology ?DG Elbow Complete Right ? ?Result Date: 05/09/2021 ?CLINICAL DATA:  Trauma, fall, pain EXAM: RIGHT ELBOW - COMPLETE 3+ VIEW COMPARISON:  None. FINDINGS: No recent fracture or dislocation is seen. There is no displacement of posterior fat pad. Degenerative changes are noted with bony spurs. There is smooth marginated calcification at the attachment of triceps to the olecranon, possibly suggesting calcific tendinosis. There is soft tissue swelling over the olecranon process which may suggest hematoma or bursitis. There is subcutaneous stranding along the medial aspect of right elbow suggesting contusion/hematoma. There are scattered arterial calcifications in the soft tissues. IMPRESSION: No recent fracture or dislocation is seen in the right elbow. Degenerative changes are noted, particularly in the proximal ulna. There is soft tissue swelling over the olecranon  process suggesting hematoma or bursitis. There is stranding in the subcutaneous plane along the medial aspect suggesting contusion/hematoma. Electronically Signed   By: Elmer Picker M.D.   On: 05/09/2021 11:13   ? ?Procedures ?Procedures  ? ? ?Medications Ordered in ED ?Medications - No data to display ? ?ED Course/ Medical Decision Making/ A&P ?  ?                        ?Medical Decision Making ?Amount and/or Complexity of Data  Reviewed ?Radiology: ordered. ? ? ?This patient presents to the ED for concern of elbow pain, this involves an extensive number of treatment options, and is a complaint that carries with it a high risk of complications and morbidity.  The differential diagnosis includes fx, contusion, hematoma, bursitis ? ? ?Co morbidities that complicate the patient evaluation ? ?pmhx ofchf, htn, afib on Eliquis, hyperlipidemia, dm2, basal cell carcinoma face, Grover's disease, prostate cancer, and vitamin D deficiency ? ? ?Additional history obtained: ? ?Additional history obtained from epic chart review ? ? ?Imaging Studies ordered: ? ?I ordered imaging studies including right elbow  ?I independently visualized and interpreted imaging which showed  ?  ?IMPRESSION:  ?No recent fracture or dislocation is seen in the right elbow.  ?Degenerative changes are noted, particularly in the proximal ulna.  ?   ?There is soft tissue swelling over the olecranon process suggesting  ?hematoma or bursitis. There is stranding in the subcutaneous plane  ?along the medial aspect suggesting contusion/hematoma.  ? ?I agree with the radiologist interpretation ? ? ? ?Problem List / ED Course: ? ?Elbow pain:  no fx.  Hematoma vs bursitis to right elbow on exam.  Pt does not want anything for pain.  He is stable for d/c.  Return if worse.  ? ? ?Social Determinants of Health: ? ?Lives at home ? ? ?Dispostion: ? ?After consideration of the diagnostic results and the patients response to treatment, I feel that the patent  would benefit from discharge with outpatient f/u.   ? ? ? ? ? ? ? ?Final Clinical Impression(s) / ED Diagnoses ?Final diagnoses:  ?Fall, initial encounter  ?Olecranon bursitis of right elbow  ?Hematoma  ? ? ?Rx / DC Orders

## 2021-05-09 NOTE — ED Triage Notes (Signed)
Pt states he slipped and fell yesterday.  States there is a knot on his right elbow.  No head injury.  No other injuries. ?

## 2021-05-10 ENCOUNTER — Other Ambulatory Visit: Payer: Self-pay | Admitting: Cardiology

## 2021-05-11 ENCOUNTER — Encounter: Payer: Self-pay | Admitting: Internal Medicine

## 2021-05-11 ENCOUNTER — Other Ambulatory Visit: Payer: Self-pay

## 2021-05-11 ENCOUNTER — Ambulatory Visit: Payer: Medicare Other | Admitting: Internal Medicine

## 2021-05-11 VITALS — BP 110/60 | HR 64 | Temp 97.4°F | Wt 192.0 lb

## 2021-05-11 DIAGNOSIS — M67441 Ganglion, right hand: Secondary | ICD-10-CM | POA: Diagnosis not present

## 2021-05-11 DIAGNOSIS — E1142 Type 2 diabetes mellitus with diabetic polyneuropathy: Secondary | ICD-10-CM

## 2021-05-11 DIAGNOSIS — L03011 Cellulitis of right finger: Secondary | ICD-10-CM

## 2021-05-11 DIAGNOSIS — R296 Repeated falls: Secondary | ICD-10-CM | POA: Diagnosis not present

## 2021-05-11 LAB — POCT GLYCOSYLATED HEMOGLOBIN (HGB A1C): Hemoglobin A1C: 5.8 % — AB (ref 4.0–5.6)

## 2021-05-11 MED ORDER — DOXYCYCLINE HYCLATE 100 MG PO TABS: 100.0000 mg | ORAL_TABLET | Freq: Two times a day (BID) | ORAL | 0 refills | Status: AC

## 2021-05-11 MED ORDER — FLECAINIDE ACETATE 100 MG PO TABS: 100.0000 mg | ORAL_TABLET | Freq: Two times a day (BID) | ORAL | 1 refills | Status: DC

## 2021-05-11 NOTE — Progress Notes (Signed)
? ? ? ?Established Patient Office Visit ? ? ? ? ?This visit occurred during the SARS-CoV-2 public health emergency.  Safety protocols were in place, including screening questions prior to the visit, additional usage of staff PPE, and extensive cleaning of exam room while observing appropriate contact time as indicated for disinfecting solutions.  ? ? ?CC/Reason for Visit: Discuss acute and chronic concerns ? ?HPI: Charles Lloyd. is a 81 y.o. male who is coming in today for the above mentioned reasons. Past Medical History is significant for: Atrial fibrillation, chronic diastolic heart failure who is stable and has been seen by cardiology locally.  Also has a history of well-controlled type 2 diabetes on metformin, hyperlipidemia on Lipitor, prior history of prostate cancer status post TURP, prior history of B12 deficiency on monthly supplementation.  He is extremely frustrated with his neuropathy.  He apparently spent thousands of dollars at a "neuropathy specialist" in [place] without any significant improvement.  Per his report it sounds like a lot of alternative treatments were offered.  He has never seen a neurologist.  He has been on gabapentin without relief.  He says what good is it to get to his age with good mental capacity if he is unable to enjoy life because his neuropathy impedes him from doing the things that he likes to do.  He also has a repeated infected mucoid cyst of the DIP of his right third finger.  He has been treated with antibiotics at least twice in the past for this. ? ? ?Past Medical/Surgical History: ?Past Medical History:  ?Diagnosis Date  ? Acute tracheobronchitis 01/05/2019  ? B12 deficiency   ? Basal cell carcinoma (BCC) of left side of nose 01/28/2018  ? Cancer [facility])   ? Cardiomyopathy ([place]) 03/10/2018  ? With chronic atrial fibrillation  ? CHF (congestive heart failure) ([place]) 04/16/2013  ? Functional class II, ejection fraction 35-40%  Formatting of this note might be  different from the original. Functional class II, ejection fraction 35-40%  ? Chronic anticoagulation   ? Chronic diastolic (congestive) heart failure ([place]) 04/16/2013  ? Functional class II, ejection fraction 35-40%  Last Assessment & Plan:  Clinically stable Volume well controlled meds reviewed  ? Chronic diastolic CHF (congestive heart failure) ([place]) 04/16/2013  ? Functional class II, ejection fraction 35-40%  Last Assessment & Plan:  Clinically stable Volume well controlled meds reviewed  ? Colon polyps 04/16/2013  ? Diabetic neuropathy ([place])   ? Eosinophil count raised 02/17/2019  ? Essential hypertension 01/06/2010  ? Last Assessment & Plan:  Well controlled Continue med management  ? 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 Continuous iching  Last Assessment & Plan:  No current medications.  Steroid usage was discontinued several months ago.  Follow up with Dermatology as planned.  ? Hypercholesterolemia 01/06/2010  ? Last Assessment & Plan:  Repeat labs recommended  ? Hyperlipidemia associated with type 2 diabetes mellitus ([place]) 01/28/2018  ? 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 Last Assessment & Plan:  Well controlled Continue med management  ? Hyponatremia 12/17/2018  ? 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 Last Assessment & Plan:  Id following ?ongoing abx managementy reported Request records  ? Infection of prosthetic right knee joint ([place])   ? Insomnia 03/04/2012  ? Grief with loss of wife 02/20/12  Last Assessment & Plan:  Improved sx  contineu xanax prn Se discussed  ? Lesion of liver 04/20/2019  ? -On cardiac CT 02/2019  ?  Mild reactive airways disease   ? Neoplasm of prostate 04/16/2013  ? Neoplasm of prostate, malignant ([place]) 01/06/2010  ? [name] S/p radiation therapy   Last Assessment & Plan:  Repeat psa today No urinary sx Pt reported fatigue/poor appetite  ? On amiodarone therapy 09/07/2013  ? On continuous oral anticoagulation 01/28/2018  ? Other activity(E029.9) 04/20/2013   ? Formatting of this note might be different from the original. Transthoracic Echocardiogram-03/10/2013-[facility]: Normal left ventricular wall thickness and cavity size.  Global left ventricular systolic function is moderately reduced.  The estimated ejection fraction is 35-40%.  The left atrium is moderately enlarged.  The right atrium is mildly enlarged.  No significant valvular   ? Overweight (BMI 25.0-29.9) 10/22/2016  ? Persistent atrial fibrillation ([place]) 04/16/2013  ? Last Assessment & Plan:  Rate controlled Continue med management eliquis for stroke preventino  Formatting of this note might be different from the original.  Drug  HX Current Rx Pre-ABL inefficacy Pre-ABL intolerant Post-ABL inefficacy Post-ABL intolerant max dose/24h M/Y end comments  sotalol                  dofetilide                  flecainide                  propafenone                  am  ? S/P TKR (total knee replacement), bilateral   ? Secondary hypercoagulable state ([place]) 04/09/2019  ? Tinea cruris 04/16/2013  ? Type 2 diabetes mellitus with diabetic polyneuropathy, without long-term current use of insulin ([place]) 04/16/2013  ? Last Assessment & Plan:  Labs today Pt reports well controlled on ambulatory monitoring  ? Vitamin D deficiency 07/25/2018  ? ? ?Past Surgical History:  ?Procedure Laterality Date  ? ATRIAL FIBRILLATION ABLATION N/A 03/12/2019  ? Procedure: ATRIAL FIBRILLATION ABLATION;  Surgeon: [name], MD;  Location: [facility];  Service: Cardiovascular;  Laterality: N/A;  ? ATRIAL FIBRILLATION ABLATION N/A 04/29/2020  ? Procedure: ATRIAL FIBRILLATION ABLATION;  Surgeon: [name], MD;  Location: [facility];  Service: Cardiovascular;  Laterality: N/A;  ? CARDIAC ELECTROPHYSIOLOGY STUDY AND ABLATION    ? CARDIOVERSION    ? CARDIOVERSION N/A 10/10/2018  ? Procedure: CARDIOVERSION;  Surgeon: [name], MD;  Location: [facility] ENDOSCOPY;  Service: Cardiovascular;  Laterality: N/A;   ? CARDIOVERSION N/A 05/18/2020  ? Procedure: CARDIOVERSION;  Surgeon: [name], MD;  Location: [facility];  Service: Cardiovascular;  Laterality: N/A;  ? CARDIOVERSION N/A 12/09/2020  ? Procedure: CARDIOVERSION;  Surgeon: Lelon Perla, MD;  Location: [facility] ENDOSCOPY;  Service: Cardiovascular;  Laterality: N/A;  ? CHOLECYSTECTOMY    ? PROSTATECTOMY    ? REPLACEMENT TOTAL KNEE BILATERAL    ? ? ?Social History: ? reports that he has never smoked. He has never used smokeless tobacco. He reports current alcohol use of about 4.0 standard drinks per week. He reports that he does not use drugs. ? ?Allergies: ?No Known Allergies ? ?Family History:  ?Family History  ?Problem Relation Age of Onset  ? Cancer Mother   ? Depression Mother   ? Early death Mother   ? Cancer Father   ? Depression Father   ? Early death Father   ? ? ? ?Current Outpatient Medications:  ?  albuterol (VENTOLIN HFA) 108 (90 Base) MCG/ACT inhaler, Inhale  1-2 puffs into the lungs every 6 (six) hours as needed for wheezing or shortness of breath., Disp: 8 g, Rfl: 2 ?  amLODipine (NORVASC) 5 MG tablet, Take 1 tablet (5 mg total) by mouth daily., Disp: 90 tablet, Rfl: 3 ?  amoxicillin (AMOXIL) 500 MG capsule, Take 2,000 mg by mouth once as needed (prior to dental procedure.)., Disp: , Rfl:  ?  apixaban (ELIQUIS) 5 MG TABS tablet, Take 1 tablet (5 mg total) by mouth 2 (two) times daily., Disp: 180 tablet, Rfl: 1 ?  atorvastatin (LIPITOR) 20 MG tablet, Take 1 tablet (20 mg total) by mouth daily., Disp: 90 tablet, Rfl: 0 ?  doxycycline (VIBRA-TABS) 100 MG tablet, Take 1 tablet (100 mg total) by mouth 2 (two) times daily for 7 days., Disp: 14 tablet, Rfl: 0 ?  flecainide (TAMBOCOR) 100 MG tablet, Take 1 tablet (100 mg total) by mouth 2 (two) times daily., Disp: 180 tablet, Rfl: 1 ?  fluticasone (FLONASE) 50 MCG/ACT nasal spray, Place 2 sprays into both nostrils daily., Disp: 16 g, Rfl: 2 ?  fluticasone furoate-vilanterol (BREO ELLIPTA) 100-25 MCG/INH  AEPB, Inhale 1 puff into the lungs daily., Disp: 60 each, Rfl: 11 ?  gabapentin (NEURONTIN) 300 MG capsule, TAKE 2 CAPSULES BY MOUTH AT BEDTIME, Disp: 60 capsule, Rfl: 2 ?  glucose blood (ACCU-CHEK AVIVA P

## 2021-05-11 NOTE — Patient Instructions (Signed)
-  Nice seeing you today!! ? ?-Start doxycycline 100 mg twice daily for your finger; if recurs would suggest hand specialist evaluation. ? ?-Referral to neurology for your neuropathy has been requested. ? ?-Schedule follow up in 3 months for your physical. Come in fasting that day. ?

## 2021-05-14 ENCOUNTER — Other Ambulatory Visit: Payer: Self-pay | Admitting: Cardiology

## 2021-05-14 DIAGNOSIS — I10 Essential (primary) hypertension: Secondary | ICD-10-CM

## 2021-05-15 ENCOUNTER — Other Ambulatory Visit: Payer: Self-pay | Admitting: Cardiology

## 2021-05-15 DIAGNOSIS — I10 Essential (primary) hypertension: Secondary | ICD-10-CM

## 2021-05-19 ENCOUNTER — Other Ambulatory Visit: Payer: Self-pay

## 2021-05-19 ENCOUNTER — Ambulatory Visit (INDEPENDENT_AMBULATORY_CARE_PROVIDER_SITE_OTHER): Payer: Medicare Other | Admitting: Family Medicine

## 2021-05-19 ENCOUNTER — Ambulatory Visit (INDEPENDENT_AMBULATORY_CARE_PROVIDER_SITE_OTHER): Payer: Medicare Other

## 2021-05-19 VITALS — BP 140/70 | HR 67 | Temp 97.8°F | Ht 70.0 in | Wt 189.0 lb

## 2021-05-19 DIAGNOSIS — M79672 Pain in left foot: Secondary | ICD-10-CM

## 2021-05-19 IMAGING — DX DG FOOT COMPLETE 3+V*L*
3 series · 3 of 3 positions shown · non-contrast
Comparison: None.

CLINICAL DATA: Left foot pain, no known injury.

EXAM:
LEFT FOOT - COMPLETE 3+ VIEW

[foot ap]
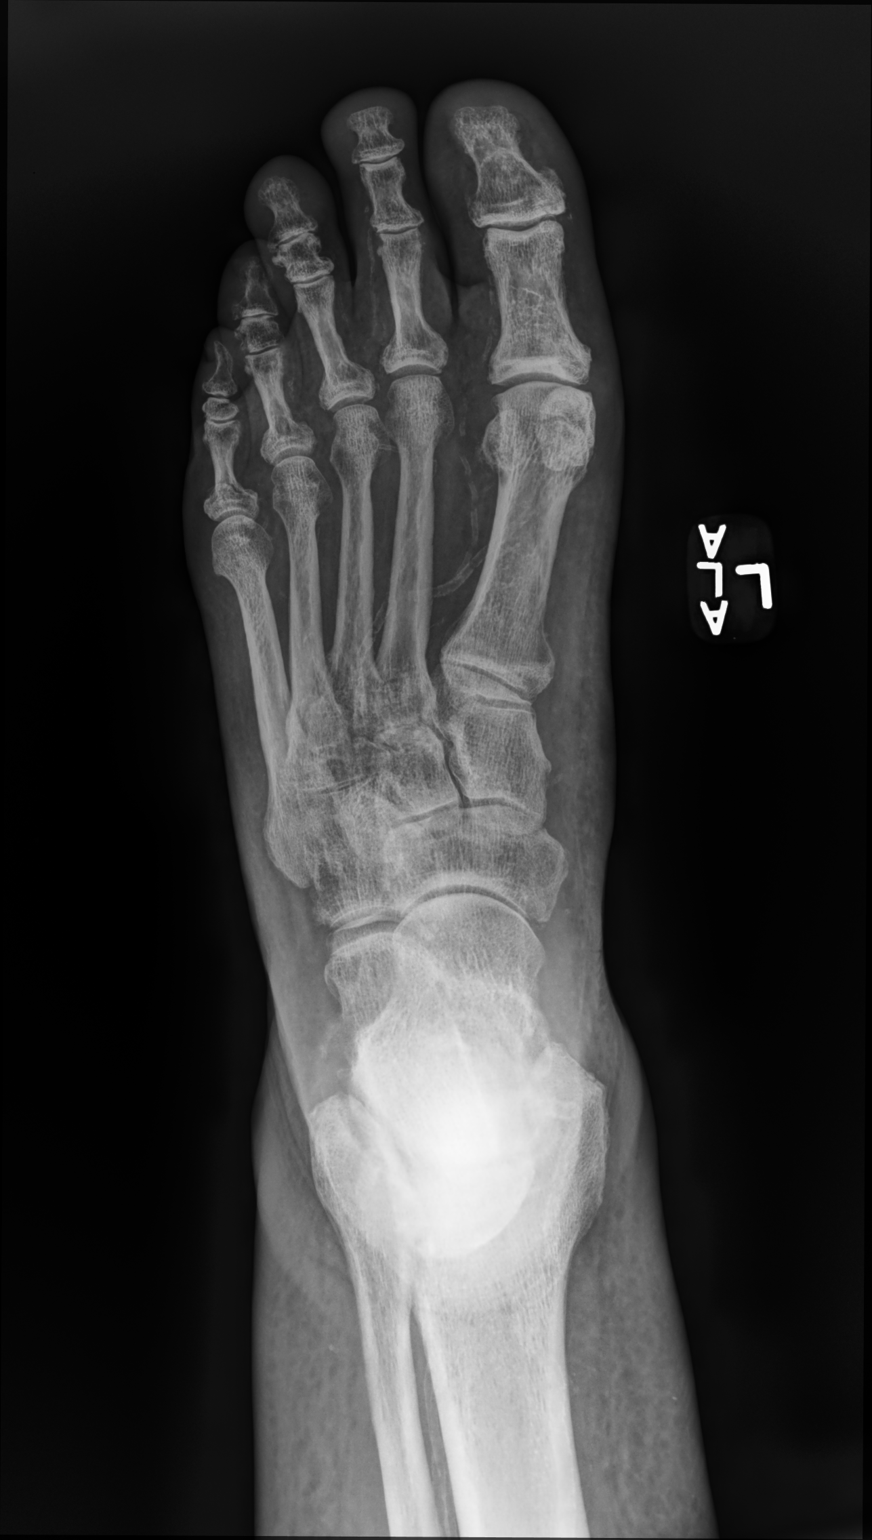

[foot mlo]
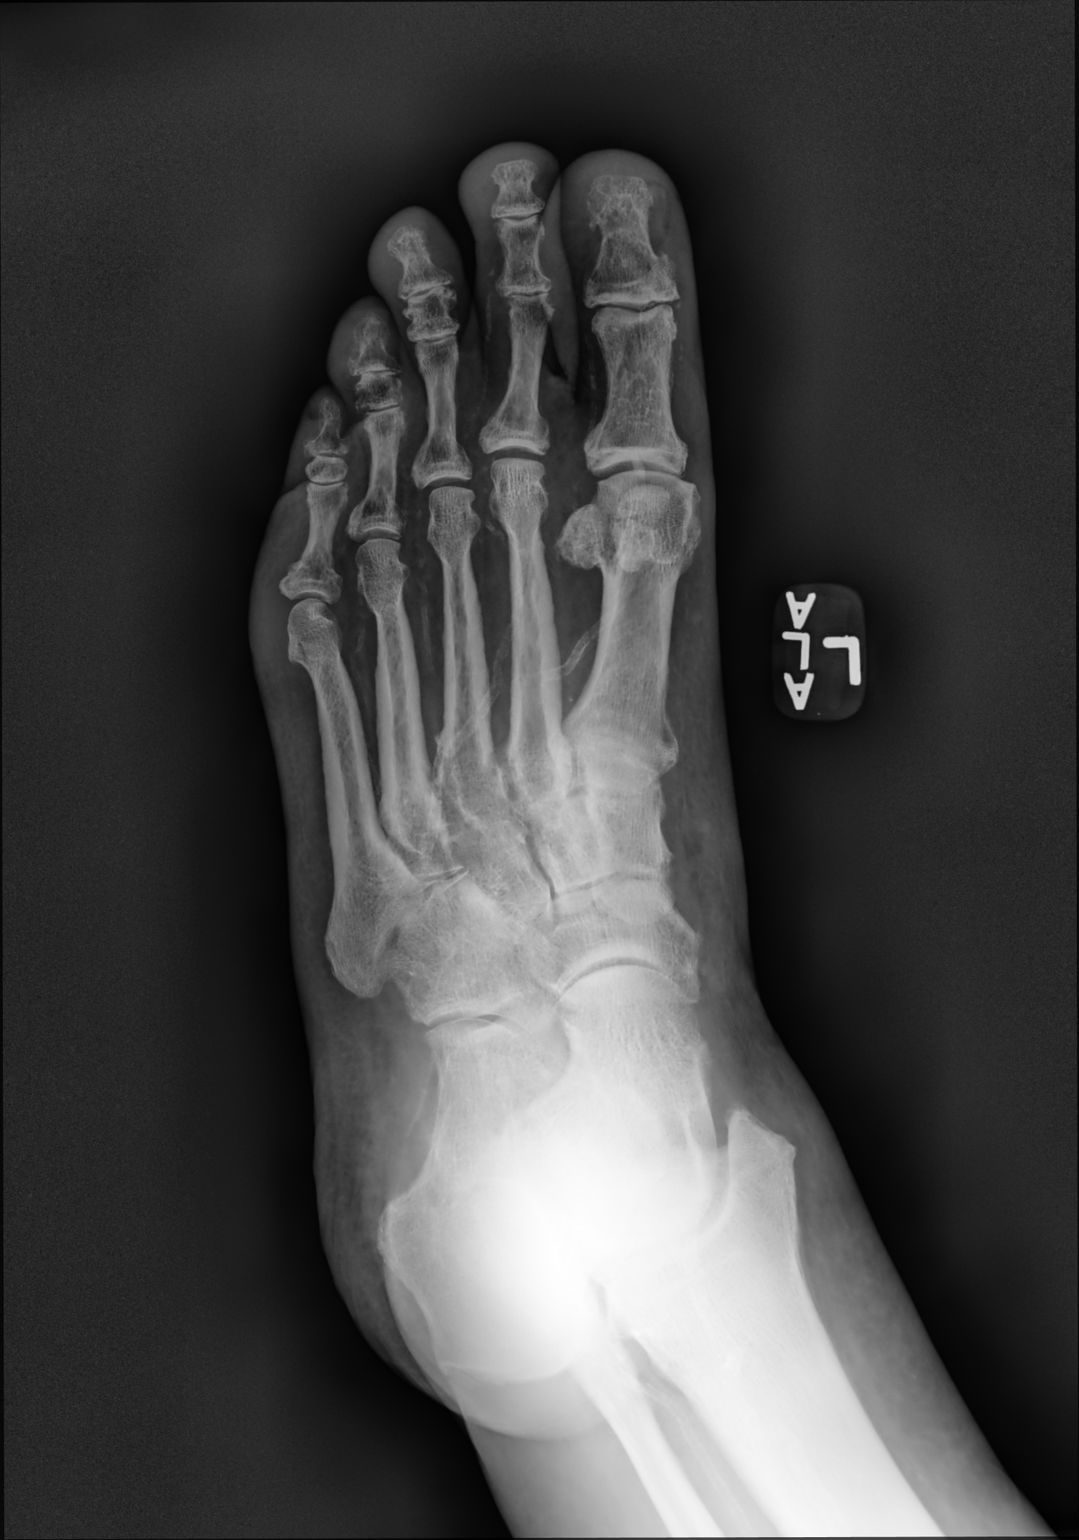

[foot lat]
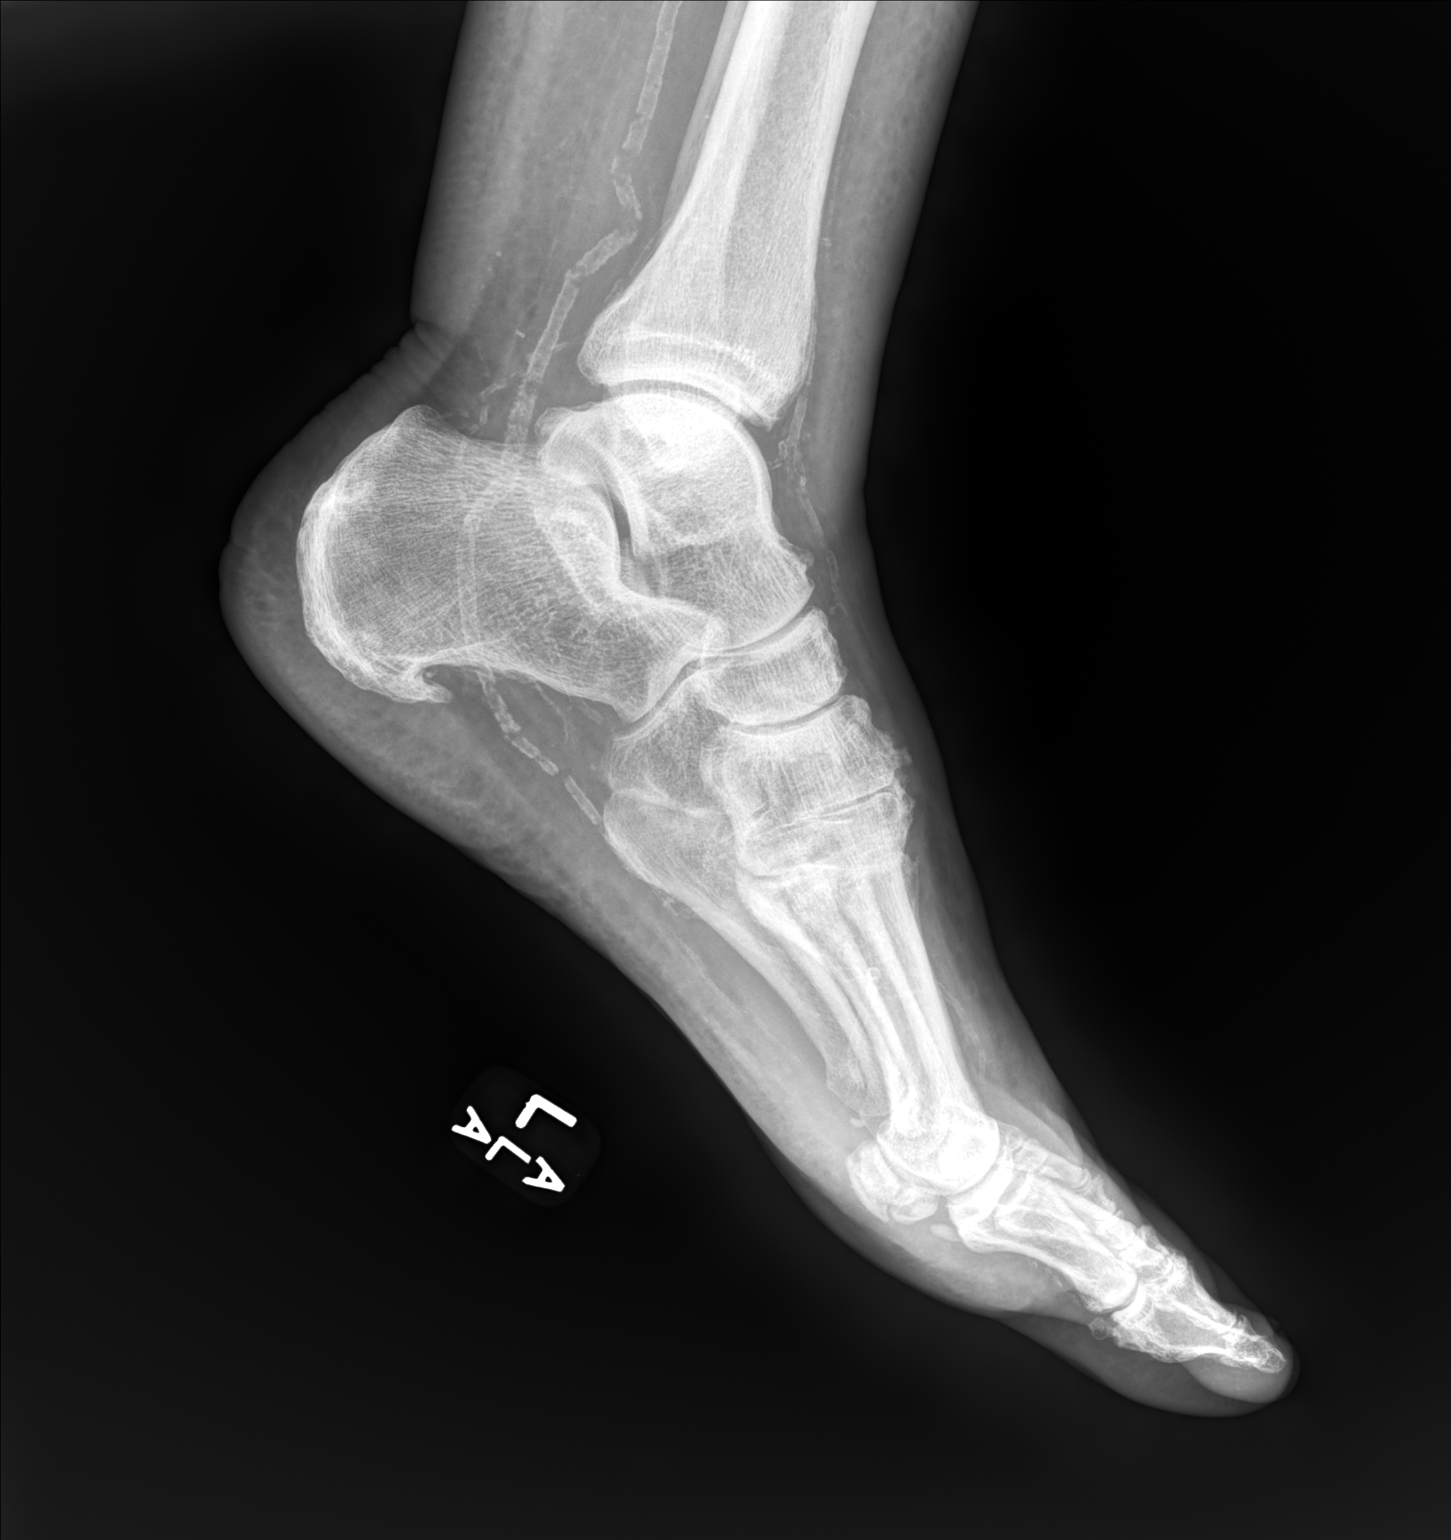

[3 of 3 positions shown; findings below may reference images not displayed]

FINDINGS: There is no evidence of fracture or dislocation. Mild generalized
arthropathy with interphalangeal joint space narrowing and minimal
spurring. Prominent vascular calcifications.
IMPRESSION: 1.  No evidence of fracture or dislocation.

2.  Mild generalized osteoarthritis.

3.  Prominent vascular calcifications.

## 2021-05-19 NOTE — Progress Notes (Signed)
? ?Established Patient Office Visit ? ?Subjective:  ?Patient ID: Charles Gum., male    DOB: Apr 12, 1940  Age: 81 y.o. MRN: [id number] ? ?CC:  ?Chief Complaint  ?Patient presents with  ? Foot Pain  ?  Patient complains of left foot pain, x1 day   ? ? ?HPI ?Charles Gum. presents for 2-day history of left foot pain.  Pain somewhat poorly localized.  States pain is somewhat lateral but also somewhat volar.  Denies any erythema.  No warmth.  No ecchymosis.  Denies any specific injury.  He has history of chronic diastolic heart failure has some chronic lower extremity edema which is unchanged.  No history of gout.  No recent change of shoewear.  Pain with ambulation but not particularly at rest. ? ?Chronic problems include history of atrial fibrillation, diastolic heart failure, type 2 diabetes, history of prostate cancer, spinal stenosis.  No known history of Charcot neuropathy ? ?Past Medical History:  ?Diagnosis Date  ? Acute tracheobronchitis 01/05/2019  ? B12 deficiency   ? Basal cell carcinoma (BCC) of left side of nose 01/28/2018  ? Cancer [facility])   ? Cardiomyopathy ([place]) 03/10/2018  ? With chronic atrial fibrillation  ? CHF (congestive heart failure) ([place]) 04/16/2013  ? Functional class II, ejection fraction 35-40%  Formatting of this note might be different from the original. Functional class II, ejection fraction 35-40%  ? Chronic anticoagulation   ? Chronic diastolic (congestive) heart failure ([place]) 04/16/2013  ? Functional class II, ejection fraction 35-40%  Last Assessment & Plan:  Clinically stable Volume well controlled meds reviewed  ? Chronic diastolic CHF (congestive heart failure) ([place]) 04/16/2013  ? Functional class II, ejection fraction 35-40%  Last Assessment & Plan:  Clinically stable Volume well controlled meds reviewed  ? Colon polyps 04/16/2013  ? Diabetic neuropathy ([place])   ? Eosinophil count raised 02/17/2019  ? Essential hypertension 01/06/2010  ? Last Assessment & Plan:  Well controlled  Continue med management  ? 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 Continuous iching  Last Assessment & Plan:  No current medications.  Steroid usage was discontinued several months ago.  Follow up with Dermatology as planned.  ? Hypercholesterolemia 01/06/2010  ? Last Assessment & Plan:  Repeat labs recommended  ? Hyperlipidemia associated with type 2 diabetes mellitus ([place]) 01/28/2018  ? Hypertensive heart disease with heart failure ([place]) 01/06/2010  ? Last Assessment & Plan:  Well controlled Continue med management  ? Hyponatremia 12/17/2018  ? 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 Last Assessment & Plan:  Id following ?ongoing abx managementy reported Request records  ? Infection of prosthetic right knee joint ([place])   ? Insomnia 03/04/2012  ? Grief with loss of wife 02/20/12  Last Assessment & Plan:  Improved sx  contineu xanax prn Se discussed  ? Lesion of liver 04/20/2019  ? -On cardiac CT 02/2019  ? Mild reactive airways disease   ? Neoplasm of prostate 04/16/2013  ? Neoplasm of prostate, malignant ([place]) 01/06/2010  ? [name] S/p radiation therapy   Last Assessment & Plan:  Repeat psa today No urinary sx Pt reported fatigue/poor appetite  ? On amiodarone therapy 09/07/2013  ? On continuous oral anticoagulation 01/28/2018  ? Other activity(E029.9) 04/20/2013  ? Formatting of this note might be different from the original. Transthoracic Echocardiogram-03/10/2013-[facility]: Normal left ventricular wall thickness and cavity size.  Global left ventricular systolic function is moderately reduced.  The estimated ejection fraction is 35-40%.  The left atrium is moderately enlarged.  The right atrium is mildly enlarged.  No significant valvular   ? Overweight (BMI 25.0-29.9) 10/22/2016  ? Persistent atrial fibrillation ([place]) 04/16/2013  ? Last Assessment & Plan:  Rate controlled Continue med management eliquis for stroke preventino  Formatting of this note might be different from the original.  Drug  HX  Current Rx Pre-ABL inefficacy Pre-ABL intolerant Post-ABL inefficacy Post-ABL intolerant max dose/24h M/Y end comments  sotalol                  dofetilide                  flecainide                  propafenone                  am  ? S/P TKR (total knee replacement), bilateral   ? Secondary hypercoagulable state ([place]) 04/09/2019  ? Tinea cruris 04/16/2013  ? Type 2 diabetes mellitus with diabetic polyneuropathy, without long-term current use of insulin ([place]) 04/16/2013  ? Last Assessment & Plan:  Labs today Pt reports well controlled on ambulatory monitoring  ? Vitamin D deficiency 07/25/2018  ? ? ?Past Surgical History:  ?Procedure Laterality Date  ? ATRIAL FIBRILLATION ABLATION N/A 03/12/2019  ? Procedure: ATRIAL FIBRILLATION ABLATION;  Surgeon: [name], MD;  Location: [place] [facility];  Service: Cardiovascular;  Laterality: N/A;  ? ATRIAL FIBRILLATION ABLATION N/A 04/29/2020  ? Procedure: ATRIAL FIBRILLATION ABLATION;  Surgeon: [name], MD;  Location: [place] [facility];  Service: Cardiovascular;  Laterality: N/A;  ? CARDIAC ELECTROPHYSIOLOGY STUDY AND ABLATION    ? CARDIOVERSION    ? CARDIOVERSION N/A 10/10/2018  ? Procedure: CARDIOVERSION;  Surgeon: [name], MD;  Location: [facility] ENDOSCOPY;  Service: Cardiovascular;  Laterality: N/A;  ? CARDIOVERSION N/A 05/18/2020  ? Procedure: CARDIOVERSION;  Surgeon: [name], MD;  Location: [place];  Service: Cardiovascular;  Laterality: N/A;  ? CARDIOVERSION N/A 12/09/2020  ? Procedure: CARDIOVERSION;  Surgeon: Lelon Perla, MD;  Location: [facility] ENDOSCOPY;  Service: Cardiovascular;  Laterality: N/A;  ? CHOLECYSTECTOMY    ? PROSTATECTOMY    ? REPLACEMENT TOTAL KNEE BILATERAL    ? ? ?Family History  ?Problem Relation Age of Onset  ? Cancer Mother   ? Depression Mother   ? Early death Mother   ? Cancer Father   ? Depression Father   ? Early death Father   ? ? ?Social History  ? ?Socioeconomic History  ? Marital status: Widowed  ?   Spouse name: Not on file  ? Number of children: Not on file  ? Years of education: Not on file  ? Highest education level: Not on file  ?Occupational History  ? Not on file  ?Tobacco Use  ? Smoking status: Never  ? Smokeless tobacco: Never  ?Vaping Use  ? Vaping Use: Never used  ?Substance and Sexual Activity  ? Alcohol use: Yes  ?  Alcohol/week: 4.0 standard drinks  ?  Types: 2 Glasses of wine, 2 Standard drinks or equivalent per week  ?  Comment: daily  ? Drug use: Never  ? Sexual activity: Not Currently  ?Other Topics Concern  ? Not on file  ?Social History Narrative  ? Not on file  ? ?Social Determinants of Health  ? ?Financial Resource Strain: Not on file  ?Food Insecurity: Not on file  ?Transportation Needs:  Not on file  ?Physical Activity: Not on file  ?Stress: Not on file  ?Social Connections: Not on file  ?Intimate Partner Violence: Not on file  ? ? ?Outpatient Medications Prior to Visit  ?Medication Sig Dispense Refill  ? albuterol (VENTOLIN HFA) 108 (90 Base) MCG/ACT inhaler Inhale 1-2 puffs into the lungs every 6 (six) hours as needed for wheezing or shortness of breath. 8 g 2  ? amLODipine (NORVASC) 5 MG tablet Take 1 tablet (5 mg total) by mouth daily. 30 tablet 1  ? amoxicillin (AMOXIL) 500 MG capsule Take 2,000 mg by mouth once as needed (prior to dental procedure.).    ? apixaban (ELIQUIS) 5 MG TABS tablet Take 1 tablet (5 mg total) by mouth 2 (two) times daily. 180 tablet 1  ? atorvastatin (LIPITOR) 20 MG tablet Take 1 tablet (20 mg total) by mouth daily. 90 tablet 0  ? flecainide (TAMBOCOR) 100 MG tablet Take 1 tablet (100 mg total) by mouth 2 (two) times daily. 180 tablet 1  ? fluticasone (FLONASE) 50 MCG/ACT nasal spray Place 2 sprays into both nostrils daily. 16 g 2  ? fluticasone furoate-vilanterol (BREO ELLIPTA) 100-25 MCG/INH AEPB Inhale 1 puff into the lungs daily. 60 each 11  ? gabapentin (NEURONTIN) 300 MG capsule TAKE 2 CAPSULES BY MOUTH AT BEDTIME 60 capsule 2  ? glucose blood  (ACCU-CHEK AVIVA PLUS) test strip 1 each by Other route daily. Dx E11.9 100 each 12  ? montelukast (SINGULAIR) 10 MG tablet Take 10 mg by mouth at bedtime.     ? Multiple Vitamins-Minerals (MULTIVITAMIN WITH MINERALS) tabl

## 2021-05-19 NOTE — Patient Instructions (Signed)
Consider trial of icing 15-20 minutes few time daily ? ?Elevate foot frequently ? ?Could also try OTC topical Voltaren/Diclofenac gel to use three times daily   ?

## 2021-05-22 ENCOUNTER — Ambulatory Visit: Payer: Medicare Other | Admitting: Cardiology

## 2021-05-22 ENCOUNTER — Other Ambulatory Visit: Payer: Self-pay

## 2021-05-22 ENCOUNTER — Encounter: Payer: Self-pay | Admitting: Cardiology

## 2021-05-22 VITALS — BP 128/64 | HR 68 | Ht 70.0 in | Wt 188.0 lb

## 2021-05-22 DIAGNOSIS — D6869 Other thrombophilia: Secondary | ICD-10-CM | POA: Diagnosis not present

## 2021-05-22 DIAGNOSIS — I4819 Other persistent atrial fibrillation: Secondary | ICD-10-CM | POA: Diagnosis not present

## 2021-05-22 NOTE — Patient Instructions (Signed)
Medication Instructions:  °Your physician recommends that you continue on your current medications as directed. Please refer to the Current Medication list given to you today. ° °*If you need a refill on your cardiac medications before your next appointment, please call your pharmacy* ° ° °Lab Work: °None ordered ° ° °Testing/Procedures: °None ordered ° ° °Follow-Up: °At [facility], you and your health needs are our priority.  As part of our continuing mission to provide you with exceptional heart care, we have created designated Provider Care Teams.  These Care Teams include your primary Cardiologist (physician) and Advanced Practice Providers (APPs -  Physician Assistants and Nurse Practitioners) who all work together to provide you with the care you need, when you need it. ° °Your next appointment:   °6 month(s) ° °The format for your next appointment:   °In Person ° °Provider:   °Will Camnitz, MD ° ° ° °Thank you for choosing [facility]!! ° ° °Cordaro Mukai, RN °[phone] °  °

## 2021-05-22 NOTE — Progress Notes (Signed)
? ?Electrophysiology Office Note ? ? ?Date:  05/22/2021  ? ?ID:  Charles Gum., DOB [birth year], MRN [id number] ? ?PCP:  Isaac Bliss, [name], MD  ?Cardiologist:  Bettina Gavia ?Primary Electrophysiologist:  Cadden Elizondo Meredith Leeds, MD   ? ?Chief Complaint: atrial fibrillation  ?  ?History of Present Illness: ?Charles Reindl. is a 81 y.o. male who is being seen today for the evaluation of atrial fibrillation at the request of [name] Presenting today for electrophysiology evaluation. ? ?He has a history significant for atrial fibrillation, moderate systolic heart failure, diabetes, hyperlipidemia.  He had an ablation in 2015 at [facility].  He had a repeat ablation on 03/12/2019 but unfortunately went back into atrial fibrillation.  He is status post ablation 04/29/2020.  He had more frequent episodes of atrial fibrillation and is now on flecainide. ? ?Today, denies symptoms of palpitations, chest pain, shortness of breath, orthopnea, PND, lower extremity edema, claudication, dizziness, presyncope, syncope, bleeding, or neurologic sequela. The patient is tolerating medications without difficulties.  Since being seen he has done well.  He has no chest pain or shortness of breath.  He is noted no further episodes of atrial fibrillation.  Unfortunately he has hurt his ankle and is having that worked up currently. ? ?Past Medical History:  ?Diagnosis Date  ? Acute tracheobronchitis 01/05/2019  ? B12 deficiency   ? Basal cell carcinoma (BCC) of left side of nose 01/28/2018  ? Cancer [facility])   ? Cardiomyopathy ([place]) 03/10/2018  ? With chronic atrial fibrillation  ? CHF (congestive heart failure) ([place]) 04/16/2013  ? Functional class II, ejection fraction 35-40%  Formatting of this note might be different from the original. Functional class II, ejection fraction 35-40%  ? Chronic anticoagulation   ? Chronic diastolic (congestive) heart failure ([place]) 04/16/2013  ? Functional class II, ejection fraction 35-40%  Last Assessment &  Plan:  Clinically stable Volume well controlled meds reviewed  ? Chronic diastolic CHF (congestive heart failure) ([place]) 04/16/2013  ? Functional class II, ejection fraction 35-40%  Last Assessment & Plan:  Clinically stable Volume well controlled meds reviewed  ? Colon polyps 04/16/2013  ? Diabetic neuropathy ([place])   ? Eosinophil count raised 02/17/2019  ? Essential hypertension 01/06/2010  ? Last Assessment & Plan:  Well controlled Continue med management  ? 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 Continuous iching  Last Assessment & Plan:  No current medications.  Steroid usage was discontinued several months ago.  Follow up with Dermatology as planned.  ? Hypercholesterolemia 01/06/2010  ? Last Assessment & Plan:  Repeat labs recommended  ? Hyperlipidemia associated with type 2 diabetes mellitus ([place]) 01/28/2018  ? Hypertensive heart disease with heart failure ([place]) 01/06/2010  ? Last Assessment & Plan:  Well controlled Continue med management  ? Hyponatremia 12/17/2018  ? 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 Last Assessment & Plan:  Id following ?ongoing abx managementy reported Request records  ? Infection of prosthetic right knee joint ([place])   ? Insomnia 03/04/2012  ? Grief with loss of wife 02/20/12  Last Assessment & Plan:  Improved sx  contineu xanax prn Se discussed  ? Lesion of liver 04/20/2019  ? -On cardiac CT 02/2019  ? Mild reactive airways disease   ? Neoplasm of prostate 04/16/2013  ? Neoplasm of prostate, malignant ([place]) 01/06/2010  ? [name] S/p radiation therapy   Last Assessment & Plan:  Repeat psa today No urinary sx Pt reported fatigue/poor appetite  ? On amiodarone  therapy 09/07/2013  ? On continuous oral anticoagulation 01/28/2018  ? Other activity(E029.9) 04/20/2013  ? Formatting of this note might be different from the original. Transthoracic Echocardiogram-03/10/[facility]: Normal left ventricular wall thickness and cavity size.  Global left ventricular systolic function is  moderately reduced.  The estimated ejection fraction is 35-40%.  The left atrium is moderately enlarged.  The right atrium is mildly enlarged.  No significant valvular   ? Overweight (BMI 25.0-29.9) 10/22/2016  ? Persistent atrial fibrillation ([place]) 04/16/2013  ? Last Assessment & Plan:  Rate controlled Continue med management eliquis for stroke preventino  Formatting of this note might be different from the original.  Drug  HX Current Rx Pre-ABL inefficacy Pre-ABL intolerant Post-ABL inefficacy Post-ABL intolerant max dose/24h M/Y end comments  sotalol                  dofetilide                  flecainide                  propafenone                  am  ? S/P TKR (total knee replacement), bilateral   ? Secondary hypercoagulable state ([place]) 04/09/2019  ? Tinea cruris 04/16/2013  ? Type 2 diabetes mellitus with diabetic polyneuropathy, without long-term current use of insulin ([place]) 04/16/2013  ? Last Assessment & Plan:  Labs today Pt reports well controlled on ambulatory monitoring  ? Vitamin D deficiency 07/25/2018  ? ?Past Surgical History:  ?Procedure Laterality Date  ? ATRIAL FIBRILLATION ABLATION N/A 03/12/2019  ? Procedure: ATRIAL FIBRILLATION ABLATION;  Surgeon: [name], MD;  Location: [place] [facility];  Service: Cardiovascular;  Laterality: N/A;  ? ATRIAL FIBRILLATION ABLATION N/A 04/29/2020  ? Procedure: ATRIAL FIBRILLATION ABLATION;  Surgeon: [name], MD;  Location: [place] [facility];  Service: Cardiovascular;  Laterality: N/A;  ? CARDIAC ELECTROPHYSIOLOGY STUDY AND ABLATION    ? CARDIOVERSION    ? CARDIOVERSION N/A 10/10/2018  ? Procedure: CARDIOVERSION;  Surgeon: [name], MD;  Location: [facility] ENDOSCOPY;  Service: Cardiovascular;  Laterality: N/A;  ? CARDIOVERSION N/A 05/18/2020  ? Procedure: CARDIOVERSION;  Surgeon: [name], MD;  Location: [place];  Service: Cardiovascular;  Laterality: N/A;  ? CARDIOVERSION N/A 12/09/2020  ? Procedure: CARDIOVERSION;  Surgeon:  Lelon Perla, MD;  Location: [facility] ENDOSCOPY;  Service: Cardiovascular;  Laterality: N/A;  ? CHOLECYSTECTOMY    ? PROSTATECTOMY    ? REPLACEMENT TOTAL KNEE BILATERAL    ? ? ? ?Current Outpatient Medications  ?Medication Sig Dispense Refill  ? albuterol (VENTOLIN HFA) 108 (90 Base) MCG/ACT inhaler Inhale 1-2 puffs into the lungs every 6 (six) hours as needed for wheezing or shortness of breath. 8 g 2  ? amLODipine (NORVASC) 5 MG tablet Take 1 tablet (5 mg total) by mouth daily. 30 tablet 1  ? amoxicillin (AMOXIL) 500 MG capsule Take 2,000 mg by mouth once as needed (prior to dental procedure.).    ? apixaban (ELIQUIS) 5 MG TABS tablet Take 1 tablet (5 mg total) by mouth 2 (two) times daily. 180 tablet 1  ? atorvastatin (LIPITOR) 20 MG tablet Take 1 tablet (20 mg total) by mouth daily. 90 tablet 0  ? flecainide (TAMBOCOR) 100 MG tablet Take 1 tablet (100 mg total) by mouth 2 (two) times daily. 180 tablet 1  ? fluticasone (FLONASE) 50 MCG/ACT  nasal spray Place 2 sprays into both nostrils daily. 16 g 2  ? fluticasone furoate-vilanterol (BREO ELLIPTA) 100-25 MCG/INH AEPB Inhale 1 puff into the lungs daily. 60 each 11  ? gabapentin (NEURONTIN) 300 MG capsule TAKE 2 CAPSULES BY MOUTH AT BEDTIME 60 capsule 2  ? glucose blood (ACCU-CHEK AVIVA PLUS) test strip 1 each by Other route daily. Dx E11.9 100 each 12  ? KLOR-CON M20 20 MEQ tablet Take 20 mEq by mouth daily.    ? montelukast (SINGULAIR) 10 MG tablet Take 10 mg by mouth at bedtime.     ? Multiple Vitamins-Minerals (MULTIVITAMIN WITH MINERALS) tablet Take 2 tablets by mouth daily. Gummies    ? RESTASIS 0.05 % ophthalmic emulsion Place 1 drop into both eyes 2 (two) times daily.    ? saccharomyces boulardii (FLORASTOR) 250 MG capsule Take 1 capsule (250 mg total) by mouth 2 (two) times daily. 180 capsule 0  ? torsemide (DEMADEX) 20 MG tablet Take 1 tablet (20 mg total) by mouth daily.    ? valsartan (DIOVAN) 160 MG tablet Take 1 tablet (160 mg total) by mouth daily.  Patient must keep April 2023 appointment for further refills. 3 rd/ final attempt 90 tablet 0  ? carvedilol (COREG) 12.5 MG tablet Take 1 tablet (12.5 mg total) by mouth 2 (two) times daily. 180 tablet 3  ? ?No

## 2021-05-23 ENCOUNTER — Telehealth: Payer: Self-pay | Admitting: Internal Medicine

## 2021-05-23 NOTE — Telephone Encounter (Signed)
Patient is aware.  Patient states he will call the cardiologist for a prescription. ?

## 2021-05-23 NOTE — Telephone Encounter (Signed)
Spoke with patient and he state that Dr Curt Bears (cardiology) has no problem with the patient taking prednisone.  Patient states that he and Dr Jerilee Hoh had discussed it at his office visit.  Please advise. ?

## 2021-05-23 NOTE — Telephone Encounter (Signed)
Patient called in requesting a phone call back from [place]. The call will be regarding possibly being prescribed prednisone. Patient is also requesting to speak with someone regarding xray results from [place] on 03/24. ? ?Please advise.  ?

## 2021-05-25 ENCOUNTER — Telehealth: Payer: Self-pay | Admitting: Cardiology

## 2021-05-25 NOTE — Telephone Encounter (Signed)
?  Pt c/o medication issue: ? ?1. Name of Medication: prednisone  ? ?2. How are you currently taking this medication (dosage and times per day)?  ? ?3. Are you having a reaction (difficulty breathing--STAT)?  ? ?4. What is your medication issue?  Pt is calling asking if Dr. Curt Bears can write a prescription for him for prednisone  ?

## 2021-05-25 NOTE — Telephone Encounter (Signed)
Pt says that he spoke w/ PCP about Prednisone for his no taste/smell issues. ?States that every couple months he takes this and it helps his issues. ?Pt discussed this with Camnitz on Monday and says MD was ok if pt needed to take it occasionally. ?Educated to Prednisone and tachycardia. ?Advised to have PCP reach out to [facility] if needs cardiology approval to prescribe. ?Aware cardiology would not prescribe this for him though. ?Pt agreeable to reaching out to PCP to further discuss/prescribe. ?Patient verbalized understanding and agreeable to plan.  ? ? ?

## 2021-05-31 ENCOUNTER — Other Ambulatory Visit: Payer: Self-pay | Admitting: Cardiology

## 2021-06-08 ENCOUNTER — Other Ambulatory Visit: Payer: Self-pay | Admitting: Cardiology

## 2021-06-14 ENCOUNTER — Other Ambulatory Visit: Payer: Self-pay | Admitting: Internal Medicine

## 2021-06-15 ENCOUNTER — Other Ambulatory Visit: Payer: Self-pay | Admitting: Internal Medicine

## 2021-06-19 ENCOUNTER — Ambulatory Visit: Payer: Medicare Other | Admitting: Internal Medicine

## 2021-06-19 ENCOUNTER — Encounter: Payer: Self-pay | Admitting: Internal Medicine

## 2021-06-19 VITALS — BP 130/70 | HR 64 | Temp 97.7°F | Wt 186.4 lb

## 2021-06-19 DIAGNOSIS — J209 Acute bronchitis, unspecified: Secondary | ICD-10-CM

## 2021-06-19 DIAGNOSIS — M7989 Other specified soft tissue disorders: Secondary | ICD-10-CM | POA: Diagnosis not present

## 2021-06-19 DIAGNOSIS — L03011 Cellulitis of right finger: Secondary | ICD-10-CM

## 2021-06-19 MED ORDER — PREDNISONE 10 MG (21) PO TBPK: ORAL_TABLET | ORAL | 0 refills | Status: DC

## 2021-06-19 MED ORDER — DOXYCYCLINE HYCLATE 100 MG PO TABS: 100.0000 mg | ORAL_TABLET | Freq: Two times a day (BID) | ORAL | 0 refills | Status: AC

## 2021-06-19 NOTE — Progress Notes (Signed)
? ? ? ?Established Patient Office Visit ? ? ? ? ?This visit occurred during the SARS-CoV-2 public health emergency.  Safety protocols were in place, including screening questions prior to the visit, additional usage of staff PPE, and extensive cleaning of exam room while observing appropriate contact time as indicated for disinfecting solutions.  ? ? ?CC/Reason for Visit: Swelling of right fourth finger ? ?HPI: Charles Lloyd. is a 81 y.o. male who is coming in today for the above mentioned reasons.  3 days ago he noted numbness and tingling of his right fourth finger.  Ever since then he has had progressive edema and erythema of the DIP and PIP joints of that finger to the point where he can no longer extend it completely.  Chart review shows that he has had several rounds of antibiotic for presumed infections of multiple fingers and toes in the past.  At one point he was told he had an infected mucous cyst but this was of his middle right finger.  He has never been formally diagnosed with gout. ? ?Past Medical/Surgical History: ?Past Medical History:  ?Diagnosis Date  ? Acute tracheobronchitis 01/05/2019  ? B12 deficiency   ? Basal cell carcinoma (BCC) of left side of nose 01/28/2018  ? Cancer [facility])   ? Cardiomyopathy ([place]) 03/10/2018  ? With chronic atrial fibrillation  ? CHF (congestive heart failure) ([place]) 04/16/2013  ? Functional class II, ejection fraction 35-40%  Formatting of this note might be different from the original. Functional class II, ejection fraction 35-40%  ? Chronic anticoagulation   ? Chronic diastolic (congestive) heart failure ([place]) 04/16/2013  ? Functional class II, ejection fraction 35-40%  Last Assessment & Plan:  Clinically stable Volume well controlled meds reviewed  ? Chronic diastolic CHF (congestive heart failure) ([place]) 04/16/2013  ? Functional class II, ejection fraction 35-40%  Last Assessment & Plan:  Clinically stable Volume well controlled meds reviewed  ? Colon polyps 04/16/2013   ? Diabetic neuropathy ([place])   ? Eosinophil count raised 02/17/2019  ? Essential hypertension 01/06/2010  ? Last Assessment & Plan:  Well controlled Continue med management  ? 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 Continuous iching  Last Assessment & Plan:  No current medications.  Steroid usage was discontinued several months ago.  Follow up with Dermatology as planned.  ? Hypercholesterolemia 01/06/2010  ? Last Assessment & Plan:  Repeat labs recommended  ? Hyperlipidemia associated with type 2 diabetes mellitus ([place]) 01/28/2018  ? Hypertensive heart disease with heart failure ([place]) 01/06/2010  ? Last Assessment & Plan:  Well controlled Continue med management  ? Hyponatremia 12/17/2018  ? 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 Last Assessment & Plan:  Id following ?ongoing abx managementy reported Request records  ? Infection of prosthetic right knee joint ([place])   ? Insomnia 03/04/2012  ? Grief with loss of wife 02/20/12  Last Assessment & Plan:  Improved sx  contineu xanax prn Se discussed  ? Lesion of liver 04/20/2019  ? -On cardiac CT 02/2019  ? Mild reactive airways disease   ? Neoplasm of prostate 04/16/2013  ? 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 [name] S/p radiation therapy   Last Assessment & Plan:  Repeat psa today No urinary sx Pt reported fatigue/poor appetite  ? On amiodarone therapy 09/07/2013  ? On continuous oral anticoagulation 01/28/2018  ? Other activity(E029.9) 04/20/2013  ? Formatting of this note might be different from the original. Transthoracic Echocardiogram-03/10/2013-[facility]:  Normal left ventricular wall thickness and cavity size.  Global left ventricular systolic function is moderately reduced.  The estimated ejection fraction is 35-40%.  The left atrium is moderately enlarged.  The right atrium is mildly enlarged.  No significant valvular   ? Overweight (BMI 25.0-29.9) 10/22/2016  ? Persistent atrial fibrillation ([place]) 04/16/2013  ? Last Assessment & Plan:   Rate controlled Continue med management eliquis for stroke preventino  Formatting of this note might be different from the original.  Drug  HX Current Rx Pre-ABL inefficacy Pre-ABL intolerant Post-ABL inefficacy Post-ABL intolerant max dose/24h M/Y end comments  sotalol                  dofetilide                  flecainide                  propafenone                  am  ? S/P TKR (total knee replacement), bilateral   ? Secondary hypercoagulable state ([place]) 04/09/2019  ? Tinea cruris 04/16/2013  ? Type 2 diabetes mellitus with diabetic polyneuropathy, without long-term current use of insulin ([place]) 04/16/2013  ? Last Assessment & Plan:  Labs today Pt reports well controlled on ambulatory monitoring  ? Vitamin D deficiency 07/25/2018  ? ? ?Past Surgical History:  ?Procedure Laterality Date  ? ATRIAL FIBRILLATION ABLATION N/A 03/12/2019  ? Procedure: ATRIAL FIBRILLATION ABLATION;  Surgeon: [name], MD;  Location: [place] [facility];  Service: Cardiovascular;  Laterality: N/A;  ? ATRIAL FIBRILLATION ABLATION N/A 04/29/2020  ? Procedure: ATRIAL FIBRILLATION ABLATION;  Surgeon: [name], MD;  Location: [place] [facility];  Service: Cardiovascular;  Laterality: N/A;  ? CARDIAC ELECTROPHYSIOLOGY STUDY AND ABLATION    ? CARDIOVERSION    ? CARDIOVERSION N/A 10/10/2018  ? Procedure: CARDIOVERSION;  Surgeon: [name], MD;  Location: [facility] [place] ENDOSCOPY;  Service: Cardiovascular;  Laterality: N/A;  ? CARDIOVERSION N/A 05/18/2020  ? Procedure: CARDIOVERSION;  Surgeon: [name], MD;  Location: [place];  Service: Cardiovascular;  Laterality: N/A;  ? CARDIOVERSION N/A 12/09/2020  ? Procedure: CARDIOVERSION;  Surgeon: Lelon Perla, MD;  Location: [facility] ENDOSCOPY;  Service: Cardiovascular;  Laterality: N/A;  ? CHOLECYSTECTOMY    ? PROSTATECTOMY    ? REPLACEMENT TOTAL KNEE BILATERAL    ? ? ?Social History: ? reports that he has never smoked. He has never used smokeless tobacco. He reports current  alcohol use of about 4.0 standard drinks per week. He reports that he does not use drugs. ? ?Allergies: ?No Known Allergies ? ?Family History:  ?Family History  ?Problem Relation Age of Onset  ? Cancer Mother   ? Depression Mother   ? Early death Mother   ? Cancer Father   ? Depression Father   ? Early death Father   ? ? ? ?Current Outpatient Medications:  ?  albuterol (VENTOLIN HFA) 108 (90 Base) MCG/ACT inhaler, Inhale 1-2 puffs into the lungs every 6 (six) hours as needed for wheezing or shortness of breath., Disp: 8 g, Rfl: 2 ?  amLODipine (NORVASC) 5 MG tablet, TAKE 1 TABLET (5 MG TOTAL) BY MOUTH DAILY., Disp: 30 tablet, Rfl: 0 ?  amoxicillin (AMOXIL) 500 MG capsule, Take 2,000 mg by mouth once as needed (prior to dental procedure.)., Disp: , Rfl:  ?  atorvastatin (LIPITOR) 20 MG tablet, TAKE 1 TABLET DAILY,  Disp: 90 tablet, Rfl: 1 ?  carvedilol (COREG) 12.5 MG tablet, TAKE 1 TABLET BY MOUTH 2 TIMES DAILY., Disp: 60 tablet, Rfl: 0 ?  doxycycline (VIBRA-TABS) 100 MG tablet, Take 1 tablet (100 mg total) by mouth 2 (two) times daily for 10 days., Disp: 20 tablet, Rfl: 0 ?  ELIQUIS 5 MG TABS tablet, TAKE 1 TABLET TWICE A DAY, Disp: 180 tablet, Rfl: 0 ?  flecainide (TAMBOCOR) 100 MG tablet, Take 1 tablet (100 mg total) by mouth 2 (two) times daily., Disp: 180 tablet, Rfl: 1 ?  fluticasone (FLONASE) 50 MCG/ACT nasal spray, Place 2 sprays into both nostrils daily., Disp: 16 g, Rfl: 2 ?  fluticasone furoate-vilanterol (BREO ELLIPTA) 100-25 MCG/INH AEPB, Inhale 1 puff into the lungs daily., Disp: 60 each, Rfl: 11 ?  gabapentin (NEURONTIN) 300 MG capsule, TAKE 2 CAPSULES BY MOUTH AT BEDTIME, Disp: 60 capsule, Rfl: 2 ?  glucose blood (ACCU-CHEK AVIVA PLUS) test strip, 1 each by Other route daily. Dx E11.9, Disp: 100 each, Rfl: 12 ?  KLOR-CON M20 20 MEQ tablet, Take 20 mEq by mouth daily., Disp: , Rfl:  ?  montelukast (SINGULAIR) 10 MG tablet, Take 10 mg by mouth at bedtime. , Disp: , Rfl:  ?  Multiple Vitamins-Minerals  (MULTIVITAMIN WITH MINERALS) tablet, Take 2 tablets by mouth daily. Gummies, Disp: , Rfl:  ?  predniSONE (STERAPRED UNI-PAK 21 TAB) 10 MG (21) TBPK tablet, Take as directed, Disp: 21 tablet, Rfl: 0 ?  RESTAS

## 2021-06-20 ENCOUNTER — Other Ambulatory Visit: Payer: Self-pay | Admitting: Internal Medicine

## 2021-06-20 ENCOUNTER — Telehealth: Payer: Self-pay | Admitting: Internal Medicine

## 2021-06-20 DIAGNOSIS — L03011 Cellulitis of right finger: Secondary | ICD-10-CM

## 2021-06-20 NOTE — Telephone Encounter (Signed)
Pt calling regarding referral to orthopedic specialist. Patient is already established with Zollie Beckers with [facility] ( at the [facility] ortho building that they just put up) ?

## 2021-06-21 NOTE — Telephone Encounter (Signed)
Patient is aware 

## 2021-06-23 ENCOUNTER — Ambulatory Visit: Payer: Medicare Other | Admitting: Cardiology

## 2021-06-23 VITALS — BP 118/70 | HR 65 | Ht 70.0 in | Wt 186.6 lb

## 2021-06-23 DIAGNOSIS — I110 Hypertensive heart disease with heart failure: Secondary | ICD-10-CM | POA: Diagnosis not present

## 2021-06-23 DIAGNOSIS — I480 Paroxysmal atrial fibrillation: Secondary | ICD-10-CM | POA: Diagnosis not present

## 2021-06-23 DIAGNOSIS — D6869 Other thrombophilia: Secondary | ICD-10-CM | POA: Diagnosis not present

## 2021-06-23 DIAGNOSIS — M10042 Idiopathic gout, left hand: Secondary | ICD-10-CM

## 2021-06-23 DIAGNOSIS — Z79899 Other long term (current) drug therapy: Secondary | ICD-10-CM

## 2021-06-23 MED ORDER — PREDNISONE 10 MG (21) PO TBPK: ORAL_TABLET | ORAL | 0 refills | Status: DC

## 2021-06-23 MED ORDER — COLCHICINE 0.6 MG PO TABS: 0.6000 mg | ORAL_TABLET | Freq: Two times a day (BID) | ORAL | 0 refills | Status: DC | PRN

## 2021-06-23 NOTE — Progress Notes (Signed)
?[name] ?  ?Cardiology Office Note:   ? ?Date:  06/23/2021  ? ?ID:  Charles Gum., DOB 24-Apr-1940, MRN [id number] ? ?PCP:  Charles Lloyd, Charles Halsted, MD  ?Cardiologist:  Charles More, MD   ? ?Referring MD: Charles Lloyd, [place]*  ? ? ?ASSESSMENT:   ? ?1. Paroxysmal atrial fibrillation (HCC)   ?2. High risk medication use   ?3. Secondary hypercoagulable state ([place])   ?4. Hypertensive heart disease with heart failure ([place])   ?5. Acute idiopathic gout of left hand   ? ?PLAN:   ? ?In order of problems listed above: ? ?Cardiology perspective doing well maintaining sinus rhythm on flecainide we will check level continue along with his anticoagulant ?BP is at target in fact relatively low he has edema which looks to be due to calcium channel blocker will discontinue.  He is taking a loop diuretic and if his edema resolves I would stop as it may be contributing to gout ?Short course  prednisone and colchicine. ? ? ?Next appointment: 6 months ? ? ?Medication Adjustments/Labs and Tests Ordered: ?Current medicines are reviewed at length with the patient today.  Concerns regarding medicines are outlined above.  ?Orders Placed This Encounter  ?Procedures  ? Basic Metabolic Panel (BMET)  ? Uric acid  ? EKG 12-Lead  ? ?Meds ordered this encounter  ?Medications  ? colchicine 0.6 MG tablet  ?  Sig: Take 1 tablet (0.6 mg total) by mouth 2 (two) times daily as needed.  ?  Dispense:  30 tablet  ?  Refill:  0  ? predniSONE (STERAPRED UNI-PAK 21 TAB) 10 MG (21) TBPK tablet  ?  Sig: Please take Prednisone 40 mg for 2 days, then 20 mg for 2 days, then 10 mg for 2 days then stop.  ?  Dispense:  10 tablet  ?  Refill:  0  ? ? ?Chief Complaint  ?Patient presents with  ? Follow-up  ? Atrial Fibrillation  ? ? ?History of Present Illness:   ? ?Charles Breck. is a 81 y.o. male with a hx of atrial fibrillation with initially EP ablation 2015 and subsequently Dr. January 2021 hypertensive heart disease with  heart failure and chronic anticoagulation last seen 07/08/2020. ? ?Compliance with diet, lifestyle and medications: Yes ? ?From cardiology perspective doing well with no recurrent atrial fibrillation tolerates his flecainide and remains anticoagulated without bleeding ?His predominant concern today appears to be acute gouty arthritis affecting 2 fingers of his left hand..  He has an appointment to see a hand specialist in June ?He also has neuropathy in his feet and no longer golfs ?He takes a calcium channel blocker he has dependent edema ?Past Medical History:  ?Diagnosis Date  ? Acute tracheobronchitis 01/05/2019  ? B12 deficiency   ? Basal cell carcinoma (BCC) of left side of nose 01/28/2018  ? Cancer [facility])   ? Cardiomyopathy ([place]) 03/10/2018  ? With chronic atrial fibrillation  ? CHF (congestive heart failure) ([place]) 04/16/2013  ? Functional class II, ejection fraction 35-40%  Formatting of this note might be different from the original. Functional class II, ejection fraction 35-40%  ? Chronic anticoagulation   ? Chronic diastolic (congestive) heart failure ([place]) 04/16/2013  ? Functional class II, ejection fraction 35-40%  Last Assessment & Plan:  Clinically stable Volume well controlled meds reviewed  ? Chronic diastolic CHF (congestive heart failure) ([place]) 04/16/2013  ? Functional class II, ejection fraction 35-40%  Last Assessment & Plan:  Clinically stable Volume well controlled meds reviewed  ? Colon polyps 04/16/2013  ? Diabetic neuropathy ([place])   ? Eosinophil count raised 02/17/2019  ? Essential hypertension 01/06/2010  ? Last Assessment & Plan:  Well controlled Continue med management  ? 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 Continuous iching  Last Assessment & Plan:  No current medications.  Steroid usage was discontinued several months ago.  Follow up with Dermatology as planned.  ? Hypercholesterolemia 01/06/2010  ? Last Assessment & Plan:  Repeat labs recommended  ? Hyperlipidemia associated with type 2 diabetes  mellitus ([place]) 01/28/2018  ? Hypertensive heart disease with heart failure ([place]) 01/06/2010  ? Last Assessment & Plan:  Well controlled Continue med management  ? Hyponatremia 12/17/2018  ? 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 Last Assessment & Plan:  Id following ?ongoing abx managementy reported Request records  ? Infection of prosthetic right knee joint ([place])   ? Insomnia 03/04/2012  ? Grief with loss of wife 02/20/12  Last Assessment & Plan:  Improved sx  contineu xanax prn Se discussed  ? Lesion of liver 04/20/2019  ? -On cardiac CT 02/2019  ? Mild reactive airways disease   ? Neoplasm of prostate 04/16/2013  ? Neoplasm of prostate, malignant ([place]) 01/06/2010  ? [name] S/p radiation therapy   Last Assessment & Plan:  Repeat psa today No urinary sx Pt reported fatigue/poor appetite  ? On amiodarone therapy 09/07/2013  ? On continuous oral anticoagulation 01/28/2018  ? Other activity(E029.9) 04/20/2013  ? Formatting of this note might be different from the original. Transthoracic Echocardiogram-03/10/2013-[facility]: Normal left ventricular wall thickness and cavity size.  Global left ventricular systolic function is moderately reduced.  The estimated ejection fraction is 35-40%.  The left atrium is moderately enlarged.  The right atrium is mildly enlarged.  No significant valvular   ? Overweight (BMI 25.0-29.9) 10/22/2016  ? Persistent atrial fibrillation ([place]) 04/16/2013  ? Last Assessment & Plan:  Rate controlled Continue med management eliquis for stroke preventino  Formatting of this note might be different from the original.  Drug  HX Current Rx Pre-ABL inefficacy Pre-ABL intolerant Post-ABL inefficacy Post-ABL intolerant max dose/24h M/Y end comments  sotalol                  dofetilide                  flecainide                  propafenone                  am  ? S/P TKR (total knee replacement), bilateral   ? Secondary hypercoagulable state ([place]) 04/09/2019  ? Tinea cruris 04/16/2013  ?  Type 2 diabetes mellitus with diabetic polyneuropathy, without long-term current use of insulin ([place]) 04/16/2013  ? Last Assessment & Plan:  Labs today Pt reports well controlled on ambulatory monitoring  ? Vitamin D deficiency 07/25/2018  ? ? ?Past Surgical History:  ?Procedure Laterality Date  ? ATRIAL FIBRILLATION ABLATION N/A 03/12/2019  ? Procedure: ATRIAL FIBRILLATION ABLATION;  Surgeon: [name], MD;  Location: [place] [facility];  Service: Cardiovascular;  Laterality: N/A;  ? ATRIAL FIBRILLATION ABLATION N/A 04/29/2020  ? Procedure: ATRIAL FIBRILLATION ABLATION;  Surgeon: [name], MD;  Location: [place] [facility];  Service: Cardiovascular;  Laterality: N/A;  ? CARDIAC ELECTROPHYSIOLOGY STUDY AND ABLATION    ? CARDIOVERSION    ?  CARDIOVERSION N/A 10/10/2018  ? Procedure: CARDIOVERSION;  Surgeon: [name], MD;  Location: [facility] ENDOSCOPY;  Service: Cardiovascular;  Laterality: N/A;  ? CARDIOVERSION N/A 05/18/2020  ? Procedure: CARDIOVERSION;  Surgeon: [name], MD;  Location: [place];  Service: Cardiovascular;  Laterality: N/A;  ? CARDIOVERSION N/A 12/09/2020  ? Procedure: CARDIOVERSION;  Surgeon: Lelon Perla, MD;  Location: [facility] ENDOSCOPY;  Service: Cardiovascular;  Laterality: N/A;  ? CHOLECYSTECTOMY    ? PROSTATECTOMY    ? REPLACEMENT TOTAL KNEE BILATERAL    ? ? ?Current Medications: ?Current Meds  ?Medication Sig  ? albuterol (VENTOLIN HFA) 108 (90 Base) MCG/ACT inhaler Inhale 1-2 puffs into the lungs every 6 (six) hours as needed for wheezing or shortness of breath.  ? amoxicillin (AMOXIL) 500 MG capsule Take 2,000 mg by mouth once as needed (prior to dental procedure.).  ? atorvastatin (LIPITOR) 20 MG tablet TAKE 1 TABLET DAILY  ? carvedilol (COREG) 12.5 MG tablet TAKE 1 TABLET BY MOUTH 2 TIMES DAILY.  ? colchicine 0.6 MG tablet Take 1 tablet (0.6 mg total) by mouth 2 (two) times daily as needed.  ? doxycycline (VIBRA-TABS) 100 MG tablet Take 1 tablet (100 mg total)  by mouth 2 (two) times daily for 10 days.  ? ELIQUIS 5 MG TABS tablet TAKE 1 TABLET TWICE A DAY  ? flecainide (TAMBOCOR) 100 MG tablet Take 1 tablet (100 mg total) by mouth 2 (two) times daily.  ? fl

## 2021-06-23 NOTE — Patient Instructions (Signed)
Medication Instructions:  ?Your physician has recommended you make the following change in your medication:  ? ?STOP: Amlodipine ?START: Colchicine 0.6 mg twice daily as needed ?START: Prednisone 40 mg for 2 days, then 20 mg for 2 days, then 10 mg for 2 days then stop. ? ?*If you need a refill on your cardiac medications before your next appointment, please call your pharmacy* ? ? ?Lab Work: ?Your physician recommends that you return for lab work in:  ? ?Labs today: BMP, Uric acid, Flecanide level ? ?If you have labs (blood work) drawn today and your tests are completely normal, you will receive your results only by: ?MyChart Message (if you have MyChart) OR ?A paper copy in the mail ?If you have any lab test that is abnormal or we need to change your treatment, we will call you to review the results. ? ? ?Testing/Procedures: ?None ? ? ?Follow-Up: ?At [facility], you and your health needs are our priority.  As part of our continuing mission to provide you with exceptional heart care, we have created designated Provider Care Teams.  These Care Teams include your primary Cardiologist (physician) and Advanced Practice Providers (APPs -  Physician Assistants and Nurse Practitioners) who all work together to provide you with the care you need, when you need it. ? ?We recommend signing up for the patient portal called "MyChart".  Sign up information is provided on this After Visit Summary.  MyChart is used to connect with patients for Virtual Visits (Telemedicine).  Patients are able to view lab/test results, encounter notes, upcoming appointments, etc.  Non-urgent messages can be sent to your provider as well.   ?To learn more about what you can do with MyChart, go to [email or web address].   ? ?Your next appointment:   ?6 month(s) ? ?The format for your next appointment:   ?In Person ? ?Provider:   ?[name], MD  ? ? ?Other Instructions ?None ? ?Important Information About Sugar ? ? ? ? ? ? ?

## 2021-06-24 LAB — BASIC METABOLIC PANEL
BUN/Creatinine Ratio: 24 (ref 10–24)
BUN: 39 mg/dL — ABNORMAL HIGH (ref 8–27)
CO2: 20 mmol/L (ref 20–29)
Calcium: 9.2 mg/dL (ref 8.6–10.2)
Chloride: 104 mmol/L (ref 96–106)
Creatinine, Ser: 1.61 mg/dL — ABNORMAL HIGH (ref 0.76–1.27)
Glucose: 135 mg/dL — ABNORMAL HIGH (ref 70–99)
Potassium: 4.4 mmol/L (ref 3.5–5.2)
Sodium: 140 mmol/L (ref 134–144)
eGFR: 44 mL/min/{1.73_m2} — ABNORMAL LOW (ref >59)

## 2021-06-24 LAB — URIC ACID: Uric Acid: 12.6 mg/dL — ABNORMAL HIGH (ref 3.8–8.4)

## 2021-06-27 ENCOUNTER — Other Ambulatory Visit: Payer: Self-pay | Admitting: Cardiology

## 2021-07-01 ENCOUNTER — Other Ambulatory Visit: Payer: Self-pay | Admitting: Cardiology

## 2021-07-04 ENCOUNTER — Ambulatory Visit: Payer: Medicare Other | Admitting: Neurology

## 2021-07-07 ENCOUNTER — Telehealth: Payer: Self-pay

## 2021-07-07 LAB — FLECAINIDE LEVEL: Flecainide: 0.68 ug/ml (ref 0.20–1.00)

## 2021-07-07 NOTE — Telephone Encounter (Signed)
-----   Message from Richardo Priest, MD sent at 07/07/2021  2:48 PM EDT ----- ?Regarding: FW: ?Good result no changes in treatment ?----- Message ----- ?From: Interface, [facility] Lab Results In ?Sent: 06/24/2021   5:38 AM EDT ?To: Richardo Priest, MD ? ?

## 2021-07-07 NOTE — Telephone Encounter (Signed)
Left voicemail for patient to return call regarding recent lab results ?

## 2021-07-10 ENCOUNTER — Encounter: Payer: Self-pay | Admitting: Internal Medicine

## 2021-07-10 ENCOUNTER — Telehealth: Payer: Self-pay | Admitting: Cardiology

## 2021-07-10 LAB — HM DIABETES EYE EXAM

## 2021-07-10 NOTE — Telephone Encounter (Signed)
Patient informed of results.  

## 2021-07-10 NOTE — Telephone Encounter (Signed)
Patient returned call for results. Transferred to RN.  ?

## 2021-07-18 ENCOUNTER — Encounter: Payer: Self-pay | Admitting: Internal Medicine

## 2021-07-18 ENCOUNTER — Ambulatory Visit: Payer: Medicare Other | Admitting: Internal Medicine

## 2021-07-18 VITALS — BP 148/70 | HR 59 | Temp 97.8°F | Ht 70.0 in | Wt 184.5 lb

## 2021-07-18 DIAGNOSIS — R21 Rash and other nonspecific skin eruption: Secondary | ICD-10-CM | POA: Diagnosis not present

## 2021-07-18 DIAGNOSIS — M1A0411 Idiopathic chronic gout, right hand, with tophus (tophi): Secondary | ICD-10-CM | POA: Diagnosis not present

## 2021-07-18 MED ORDER — TRIAMCINOLONE ACETONIDE 0.1 % EX CREA: 1.0000 "application " | TOPICAL_CREAM | Freq: Two times a day (BID) | CUTANEOUS | 0 refills | Status: DC

## 2021-07-18 MED ORDER — ALLOPURINOL 300 MG PO TABS: 300.0000 mg | ORAL_TABLET | Freq: Every day | ORAL | 1 refills | Status: DC

## 2021-07-18 NOTE — Progress Notes (Signed)
Established Patient Office Visit     CC/Reason for Visit: Follow-up joint inflammation  HPI: Charles Lloyd. is a 81 y.o. male who is coming in today for the above mentioned reasons. Past Medical History is significant for:  Atrial fibrillation, chronic diastolic heart failure who is stable and has been seen by cardiology locally.  Also has a history of well-controlled type 2 diabetes on metformin, hyperlipidemia on Lipitor, prior history of prostate cancer status post TURP, prior history of B12 deficiency on monthly supplementation.  When last seen in office he had inflammation of right third and fourth fingers.  Differential diagnosis was gout versus cellulitis of the fingers.  He was referred to hand surgeon.  In the interim he was seen by his cardiologist who thought it was gout and placed him on prednisone and colchicine.  He had significant improvement.  He is here today for follow-up.  Labs done during that office showed an elevated uric acid.  He has also been having a rash of sun exposed areas of his chest and neck.  He gets this during the hotter months.   Past Medical/Surgical History: Past Medical History:  Diagnosis Date   Acute tracheobronchitis 01/05/2019   B12 deficiency    Basal cell carcinoma (BCC) of left side of nose 01/28/2018   Cancer ([place])    Cardiomyopathy ([place]) 03/10/2018   With chronic atrial fibrillation   CHF (congestive heart failure) ([place]) 04/16/2013   Functional class II, ejection fraction 35-40%  Formatting of this note might be different from the original. Functional class II, ejection fraction 35-40%   Chronic anticoagulation    Chronic diastolic (congestive) heart failure ([place]) 04/16/2013   Functional class II, ejection fraction 35-40%  Last Assessment & Plan:  Clinically stable Volume well controlled meds reviewed   Chronic diastolic CHF (congestive heart failure) (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  Steroid usage was discontinued several months ago.  Follow up with Dermatology as planned.   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   Last Assessment & Plan:  Id following ?ongoing abx managementy reported Request records   Infection of prosthetic right knee joint ([place])    Insomnia 03/04/2012   Grief with loss of wife 02/20/12  Last Assessment & Plan:  Improved sx  contineu xanax prn Se discussed   Lesion of liver 04/20/2019   -On cardiac CT 02/2019   Mild reactive airways disease    Neoplasm of prostate 04/16/2013   Neoplasm of prostate, malignant ([place]) 01/06/2010   [name] S/p radiation therapy   Last Assessment & Plan:  Repeat psa today No urinary sx Pt reported fatigue/poor appetite   On amiodarone therapy 09/07/2013   On continuous oral anticoagulation 01/28/2018   Other activity(E029.9) 04/20/2013   Formatting of this note  might be different from the original. Transthoracic Echocardiogram-03/10/2013-[facility]: Normal left ventricular wall thickness and cavity size.  Global left ventricular systolic function is moderately reduced.  The estimated ejection fraction is 35-40%.  The left atrium is moderately enlarged.  The right atrium is mildly enlarged.  No significant valvular    Overweight (BMI 25.0-29.9)  10/22/2016   Persistent atrial fibrillation ([place]) 04/16/2013   Last Assessment & Plan:  Rate controlled Continue med management eliquis for stroke preventino  Formatting of this note might be different from the original.  Drug  HX Current Rx Pre-ABL inefficacy Pre-ABL intolerant Post-ABL inefficacy Post-ABL intolerant max dose/24h M/Y end comments  sotalol                  dofetilide                  flecainide                  propafenone                  am   S/P TKR (total knee replacement), bilateral    Secondary hypercoagulable state ([place]) 04/09/2019   Tinea cruris 04/16/2013   Type 2 diabetes mellitus with diabetic polyneuropathy, without long-term current use of insulin ([place]) 04/16/2013   Last Assessment & Plan:  Labs today Pt reports well controlled on ambulatory monitoring   Vitamin D deficiency 07/25/2018    Past Surgical History:  Procedure Laterality Date   ATRIAL FIBRILLATION ABLATION N/A 03/12/2019   Procedure: ATRIAL FIBRILLATION ABLATION;  Surgeon: [name], MD;  Location: [place] [facility];  Service: Cardiovascular;  Laterality: N/A;   ATRIAL FIBRILLATION ABLATION N/A 04/29/2020   Procedure: ATRIAL FIBRILLATION ABLATION;  Surgeon: [name], MD;  Location: [place] [facility];  Service: Cardiovascular;  Laterality: N/A;   CARDIAC ELECTROPHYSIOLOGY STUDY AND ABLATION     CARDIOVERSION     CARDIOVERSION N/A 10/10/2018   Procedure: CARDIOVERSION;  Surgeon: [name], MD;  Location: [facility] ENDOSCOPY;  Service: Cardiovascular;  Laterality: N/A;   CARDIOVERSION N/A 05/18/2020   Procedure: CARDIOVERSION;  Surgeon: [name], MD;  Location[facility]OSCOPY;  Service: Cardiovascular;  Laterality: N/A;   CARDIOVERSION N/A 12/09/2020   Procedure: CARDIOVERSION;  Surgeon: Lelon Perla, MD;  Location: [facility] ENDOSCOPY;  Service: Cardiovascular;  Laterality: N/A;   CHOLECYSTECTOMY     PROSTATECTOMY     REPLACEMENT TOTAL KNEE BILATERAL      Social History:  reports  that he has never smoked. He has never used smokeless tobacco. He reports current alcohol use of about 4.0 standard drinks per week. He reports that he does not use drugs.  Allergies: No Known Allergies  Family History:  Family History  Problem Relation Age of Onset   Cancer Mother    Depression Mother    Early death Mother    Cancer Father    Depression Father    Early death Father      Current Outpatient Medications:    albuterol (VENTOLIN HFA) 108 (90 Base) MCG/ACT inhaler, Inhale 1-2 puffs into the lungs every 6 (six) hours as needed for wheezing or shortness of breath., Disp: 8 g, Rfl: 2   allopurinol (ZYLOPRIM) 300 MG tablet, Take 1 tablet (300 mg total) by mouth daily., Disp: 90 tablet, Rfl: 1   atorvastatin (LIPITOR) 20 MG tablet, TAKE 1 TABLET DAILY, Disp: 90 tablet, Rfl: 1   carvedilol (COREG) 12.5 MG tablet,  Take 1 tablet (12.5 mg total) by mouth 2 (two) times daily., Disp: 180 tablet, Rfl: 2   colchicine 0.6 MG tablet, Take 1 tablet (0.6 mg total) by mouth 2 (two) times daily as needed., Disp: 30 tablet, Rfl: 0   ELIQUIS 5 MG TABS tablet, TAKE 1 TABLET TWICE A DAY, Disp: 180 tablet, Rfl: 0   flecainide (TAMBOCOR) 100 MG tablet, Take 1 tablet (100 mg total) by mouth 2 (two) times daily., Disp: 180 tablet, Rfl: 1   fluticasone (FLONASE) 50 MCG/ACT nasal spray, Place 2 sprays into both nostrils daily., Disp: 16 g, Rfl: 2   fluticasone furoate-vilanterol (BREO ELLIPTA) 100-25 MCG/INH AEPB, Inhale 1 puff into the lungs daily., Disp: 60 each, Rfl: 11   gabapentin (NEURONTIN) 300 MG capsule, TAKE 2 CAPSULES BY MOUTH AT BEDTIME, Disp: 60 capsule, Rfl: 2   glucose blood (ACCU-CHEK AVIVA PLUS) test strip, 1 each by Other route daily. Dx E11.9, Disp: 100 each, Rfl: 12   KLOR-CON M20 20 MEQ tablet, Take 20 mEq by mouth daily., Disp: , Rfl:    montelukast (SINGULAIR) 10 MG tablet, Take 10 mg by mouth at bedtime. , Disp: , Rfl:    Multiple Vitamins-Minerals (MULTIVITAMIN WITH MINERALS)  tablet, Take 2 tablets by mouth daily. Gummies, Disp: , Rfl:    predniSONE (STERAPRED UNI-PAK 21 TAB) 10 MG (21) TBPK tablet, Please take Prednisone 40 mg for 2 days, then 20 mg for 2 days, then 10 mg for 2 days then stop., Disp: 10 tablet, Rfl: 0   RESTASIS 0.05 % ophthalmic emulsion, Place 1 drop into both eyes 2 (two) times daily., Disp: , Rfl:    saccharomyces boulardii (FLORASTOR) 250 MG capsule, Take 1 capsule (250 mg total) by mouth 2 (two) times daily., Disp: 180 capsule, Rfl: 0   torsemide (DEMADEX) 20 MG tablet, Take 1 tablet (20 mg total) by mouth daily., Disp: , Rfl:    triamcinolone cream (KENALOG) 0.1 %, Apply 1 application. topically 2 (two) times daily., Disp: 453 g, Rfl: 0   valsartan (DIOVAN) 160 MG tablet, Take 1 tablet (160 mg total) by mouth daily. Patient must keep April 2023 appointment for further refills. 3 rd/ final attempt, Disp: 90 tablet, Rfl: 0  Review of Systems:  Constitutional: Denies fever, chills, diaphoresis, appetite change and fatigue.  HEENT: Denies photophobia, eye pain, redness, hearing loss, ear pain, congestion, sore throat, rhinorrhea, sneezing, mouth sores, trouble swallowing, neck pain, neck stiffness and tinnitus.   Respiratory: Denies SOB, DOE, cough, chest tightness,  and wheezing.   Cardiovascular: Denies chest pain, palpitations and leg swelling.  Gastrointestinal: Denies nausea, vomiting, abdominal pain, diarrhea, constipation, blood in stool and abdominal distention.  Genitourinary: Denies dysuria, urgency, frequency, hematuria, flank pain and difficulty urinating.  Endocrine: Denies: hot or cold intolerance, sweats, changes in hair or nails, polyuria, polydipsia. Musculoskeletal: Denies myalgias, back pain, and gait problem.  Skin: Denies pallor and wound.  Neurological: Denies dizziness, seizures, syncope, weakness, light-headedness, numbness and headaches.  Hematological: Denies adenopathy. Easy bruising, personal or family bleeding history   Psychiatric/Behavioral: Denies suicidal ideation, mood changes, confusion, nervousness, sleep disturbance and agitation    Physical Exam: Vitals:   07/18/21 1001 07/18/21 1020  BP: (!) 148/80 (!) 148/70  Pulse: (!) 59   Temp: 97.8 F (36.6 C)   TempSrc: Oral   SpO2: 98%   Weight: 184 lb 8 oz (83.7 kg)   Height: '5\' 10"'$  (1.778 m)     Body mass index is 26.47 kg/m.  Constitutional: NAD, calm, comfortable Eyes: PERRL, lids and conjunctivae normal ENMT: Mucous membranes are moist.   Musculoskeletal: Redness and edema of right fourth and third fingers has resolved, he has what appear to be tophi in his third DIP joints. Skin: Pinpoint rash of upper chest and neck. Psychiatric: Normal judgment and insight. Alert and oriented x 3. Normal mood.    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  He discontinued colchicine due to diarrhea, this is appropriate. -We have discussed starting allopurinol for gout prevention, he seems a little hesitant due to pill burden but has agreed to give it a try.  Rash  - Plan: triamcinolone cream (KENALOG) 0.1 % -Suspect heat rash, have advised triamcinolone cream and sunscreen to exposed areas.    Time spent:32 minutes reviewing chart, interviewing and examining patient and formulating plan of care.     Lelon Frohlich, MD [facility]

## 2021-08-04 ENCOUNTER — Encounter (HOSPITAL_COMMUNITY): Payer: Self-pay

## 2021-08-04 ENCOUNTER — Other Ambulatory Visit: Payer: Self-pay

## 2021-08-04 ENCOUNTER — Emergency Department (HOSPITAL_COMMUNITY): Payer: Medicare Other

## 2021-08-04 ENCOUNTER — Inpatient Hospital Stay (HOSPITAL_COMMUNITY)
Admission: EM | Admit: 2021-08-04 | Discharge: 2021-08-08 | DRG: 552 | Disposition: A | Discharge status: Home Health | Payer: Medicare Other | Attending: Student | Admitting: Student

## 2021-08-04 DIAGNOSIS — M1A9XX1 Chronic gout, unspecified, with tophus (tophi): Secondary | ICD-10-CM | POA: Diagnosis present

## 2021-08-04 DIAGNOSIS — R6510 Systemic inflammatory response syndrome (SIRS) of non-infectious origin without acute organ dysfunction: Secondary | ICD-10-CM

## 2021-08-04 DIAGNOSIS — Z8546 Personal history of malignant neoplasm of prostate: Secondary | ICD-10-CM

## 2021-08-04 DIAGNOSIS — I4891 Unspecified atrial fibrillation: Secondary | ICD-10-CM | POA: Diagnosis present

## 2021-08-04 DIAGNOSIS — E7800 Pure hypercholesterolemia, unspecified: Secondary | ICD-10-CM | POA: Diagnosis present

## 2021-08-04 DIAGNOSIS — I5032 Chronic diastolic (congestive) heart failure: Secondary | ICD-10-CM | POA: Diagnosis present

## 2021-08-04 DIAGNOSIS — M4800 Spinal stenosis, site unspecified: Secondary | ICD-10-CM | POA: Diagnosis present

## 2021-08-04 DIAGNOSIS — E1122 Type 2 diabetes mellitus with diabetic chronic kidney disease: Secondary | ICD-10-CM | POA: Diagnosis present

## 2021-08-04 DIAGNOSIS — E1142 Type 2 diabetes mellitus with diabetic polyneuropathy: Secondary | ICD-10-CM | POA: Diagnosis present

## 2021-08-04 DIAGNOSIS — I10 Essential (primary) hypertension: Secondary | ICD-10-CM | POA: Diagnosis present

## 2021-08-04 DIAGNOSIS — Z96653 Presence of artificial knee joint, bilateral: Secondary | ICD-10-CM | POA: Diagnosis present

## 2021-08-04 DIAGNOSIS — Z20822 Contact with and (suspected) exposure to covid-19: Secondary | ICD-10-CM | POA: Diagnosis present

## 2021-08-04 DIAGNOSIS — I130 Hypertensive heart and chronic kidney disease with heart failure and stage 1 through stage 4 chronic kidney disease, or unspecified chronic kidney disease: Secondary | ICD-10-CM | POA: Diagnosis present

## 2021-08-04 DIAGNOSIS — D61818 Other pancytopenia: Secondary | ICD-10-CM

## 2021-08-04 DIAGNOSIS — M48061 Spinal stenosis, lumbar region without neurogenic claudication: Secondary | ICD-10-CM | POA: Diagnosis not present

## 2021-08-04 DIAGNOSIS — Y92009 Unspecified place in unspecified non-institutional (private) residence as the place of occurrence of the external cause: Secondary | ICD-10-CM

## 2021-08-04 DIAGNOSIS — W19XXXA Unspecified fall, initial encounter: Secondary | ICD-10-CM

## 2021-08-04 DIAGNOSIS — Z923 Personal history of irradiation: Secondary | ICD-10-CM

## 2021-08-04 DIAGNOSIS — N1832 Chronic kidney disease, stage 3b: Secondary | ICD-10-CM | POA: Diagnosis present

## 2021-08-04 DIAGNOSIS — G4700 Insomnia, unspecified: Secondary | ICD-10-CM | POA: Diagnosis present

## 2021-08-04 DIAGNOSIS — R739 Hyperglycemia, unspecified: Secondary | ICD-10-CM

## 2021-08-04 DIAGNOSIS — E559 Vitamin D deficiency, unspecified: Secondary | ICD-10-CM | POA: Diagnosis present

## 2021-08-04 DIAGNOSIS — S51812A Laceration without foreign body of left forearm, initial encounter: Secondary | ICD-10-CM | POA: Diagnosis present

## 2021-08-04 DIAGNOSIS — J449 Chronic obstructive pulmonary disease, unspecified: Secondary | ICD-10-CM | POA: Diagnosis present

## 2021-08-04 DIAGNOSIS — C61 Malignant neoplasm of prostate: Secondary | ICD-10-CM | POA: Diagnosis present

## 2021-08-04 DIAGNOSIS — R202 Paresthesia of skin: Secondary | ICD-10-CM

## 2021-08-04 DIAGNOSIS — E876 Hypokalemia: Secondary | ICD-10-CM

## 2021-08-04 DIAGNOSIS — E1165 Type 2 diabetes mellitus with hyperglycemia: Secondary | ICD-10-CM | POA: Diagnosis present

## 2021-08-04 DIAGNOSIS — R29898 Other symptoms and signs involving the musculoskeletal system: Secondary | ICD-10-CM

## 2021-08-04 DIAGNOSIS — R17 Unspecified jaundice: Secondary | ICD-10-CM | POA: Diagnosis not present

## 2021-08-04 DIAGNOSIS — R197 Diarrhea, unspecified: Secondary | ICD-10-CM

## 2021-08-04 DIAGNOSIS — J45909 Unspecified asthma, uncomplicated: Secondary | ICD-10-CM | POA: Diagnosis present

## 2021-08-04 DIAGNOSIS — R509 Fever, unspecified: Secondary | ICD-10-CM

## 2021-08-04 DIAGNOSIS — I429 Cardiomyopathy, unspecified: Secondary | ICD-10-CM | POA: Diagnosis present

## 2021-08-04 DIAGNOSIS — Z79899 Other long term (current) drug therapy: Secondary | ICD-10-CM

## 2021-08-04 DIAGNOSIS — E538 Deficiency of other specified B group vitamins: Secondary | ICD-10-CM | POA: Diagnosis present

## 2021-08-04 DIAGNOSIS — D696 Thrombocytopenia, unspecified: Secondary | ICD-10-CM

## 2021-08-04 DIAGNOSIS — I480 Paroxysmal atrial fibrillation: Secondary | ICD-10-CM | POA: Diagnosis present

## 2021-08-04 DIAGNOSIS — Z85828 Personal history of other malignant neoplasm of skin: Secondary | ICD-10-CM

## 2021-08-04 DIAGNOSIS — Z9221 Personal history of antineoplastic chemotherapy: Secondary | ICD-10-CM

## 2021-08-04 DIAGNOSIS — Z809 Family history of malignant neoplasm, unspecified: Secondary | ICD-10-CM

## 2021-08-04 DIAGNOSIS — G8929 Other chronic pain: Secondary | ICD-10-CM | POA: Diagnosis present

## 2021-08-04 DIAGNOSIS — Z7901 Long term (current) use of anticoagulants: Secondary | ICD-10-CM

## 2021-08-04 DIAGNOSIS — W1830XA Fall on same level, unspecified, initial encounter: Secondary | ICD-10-CM | POA: Diagnosis present

## 2021-08-04 DIAGNOSIS — N179 Acute kidney failure, unspecified: Secondary | ICD-10-CM | POA: Diagnosis present

## 2021-08-04 DIAGNOSIS — E871 Hypo-osmolality and hyponatremia: Secondary | ICD-10-CM | POA: Diagnosis present

## 2021-08-04 LAB — COMPREHENSIVE METABOLIC PANEL
ALT: 24 U/L (ref 0–44)
AST: 45 U/L — ABNORMAL HIGH (ref 15–41)
Albumin: 3.1 g/dL — ABNORMAL LOW (ref 3.5–5.0)
Alkaline Phosphatase: 85 U/L (ref 38–126)
Anion gap: 5 (ref 5–15)
BUN: 22 mg/dL (ref 8–23)
CO2: 25 mmol/L (ref 22–32)
Calcium: 8.1 mg/dL — ABNORMAL LOW (ref 8.9–10.3)
Chloride: 104 mmol/L (ref 98–111)
Creatinine, Ser: 1.7 mg/dL — ABNORMAL HIGH (ref 0.61–1.24)
GFR, Estimated: 41 mL/min — ABNORMAL LOW (ref >60)
Glucose, Bld: 180 mg/dL — ABNORMAL HIGH (ref 70–99)
Potassium: 3.6 mmol/L (ref 3.5–5.1)
Sodium: 134 mmol/L — ABNORMAL LOW (ref 135–145)
Total Bilirubin: 1 mg/dL (ref 0.3–1.2)
Total Protein: 6.1 g/dL — ABNORMAL LOW (ref 6.5–8.1)

## 2021-08-04 LAB — URINALYSIS, ROUTINE W REFLEX MICROSCOPIC
Bacteria, UA: NONE SEEN
Bilirubin Urine: NEGATIVE
Glucose, UA: NEGATIVE mg/dL
Hgb urine dipstick: NEGATIVE
Ketones, ur: NEGATIVE mg/dL
Leukocytes,Ua: NEGATIVE
Nitrite: NEGATIVE
Protein, ur: 30 mg/dL — AB
Specific Gravity, Urine: 1.014 (ref 1.005–1.030)
pH: 5 (ref 5.0–8.0)

## 2021-08-04 LAB — CBC WITH DIFFERENTIAL/PLATELET
Abs Immature Granulocytes: 0.03 10*3/uL (ref 0.00–0.07)
Basophils Absolute: 0 10*3/uL (ref 0.0–0.1)
Basophils Relative: 1 %
Eosinophils Absolute: 0 10*3/uL (ref 0.0–0.5)
Eosinophils Relative: 0 %
HCT: 32.8 % — ABNORMAL LOW (ref 39.0–52.0)
Hemoglobin: 10.6 g/dL — ABNORMAL LOW (ref 13.0–17.0)
Immature Granulocytes: 1 %
Lymphocytes Relative: 13 %
Lymphs Abs: 0.4 10*3/uL — ABNORMAL LOW (ref 0.7–4.0)
MCH: 30.3 pg (ref 26.0–34.0)
MCHC: 32.3 g/dL (ref 30.0–36.0)
MCV: 93.7 fL (ref 80.0–100.0)
Monocytes Absolute: 0.2 10*3/uL (ref 0.1–1.0)
Monocytes Relative: 7 %
Neutro Abs: 2.3 10*3/uL (ref 1.7–7.7)
Neutrophils Relative %: 78 %
Platelets: 95 10*3/uL — ABNORMAL LOW (ref 150–400)
RBC: 3.5 MIL/uL — ABNORMAL LOW (ref 4.22–5.81)
RDW: 15.1 % (ref 11.5–15.5)
WBC: 3 10*3/uL — ABNORMAL LOW (ref 4.0–10.5)
nRBC: 0 % (ref 0.0–0.2)

## 2021-08-04 LAB — PROTIME-INR
INR: 1.7 — ABNORMAL HIGH (ref 0.8–1.2)
Prothrombin Time: 19.4 seconds — ABNORMAL HIGH (ref 11.4–15.2)

## 2021-08-04 LAB — LACTIC ACID, PLASMA: Lactic Acid, Venous: 1.8 mmol/L (ref 0.5–1.9)

## 2021-08-04 LAB — APTT: aPTT: 32 seconds (ref 24–36)

## 2021-08-04 LAB — SARS CORONAVIRUS 2 BY RT PCR: SARS Coronavirus 2 by RT PCR: NEGATIVE

## 2021-08-04 IMAGING — DX DG LUMBAR SPINE COMPLETE 4+V
5 series · 5 of 5 positions shown · non-contrast
Comparison: None Available.

CLINICAL DATA: Fall .

EXAM:
LUMBAR SPINE - COMPLETE 4+ VIEW

[l-spine ap]
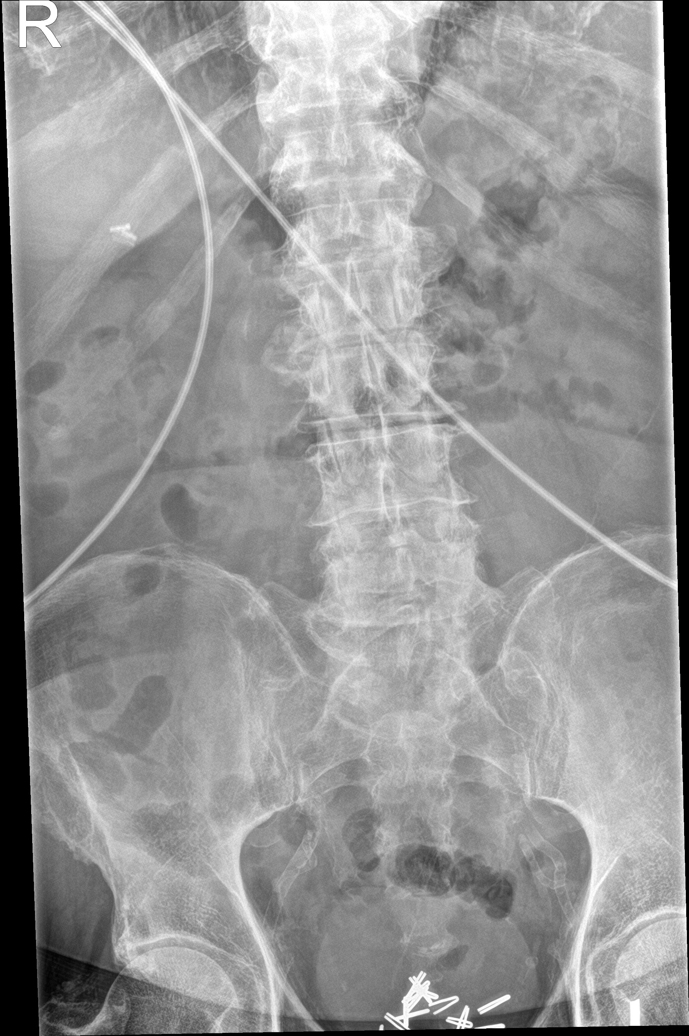

[l-spine obl (1 of 2)]
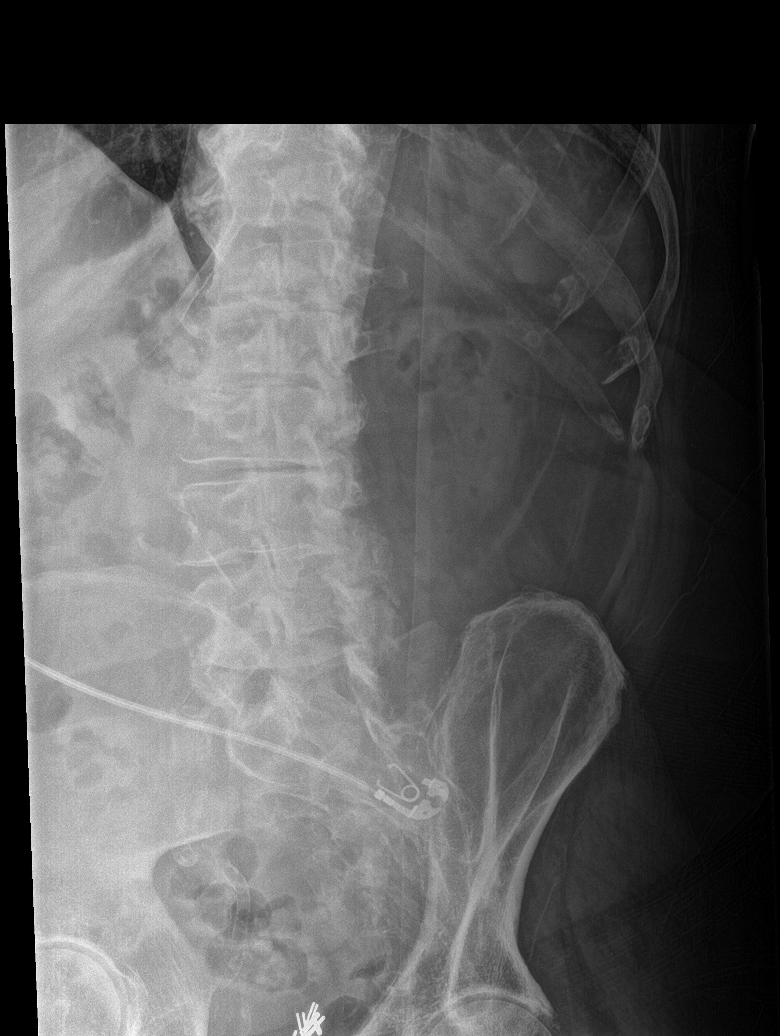

[l-spine obl (2 of 2)]
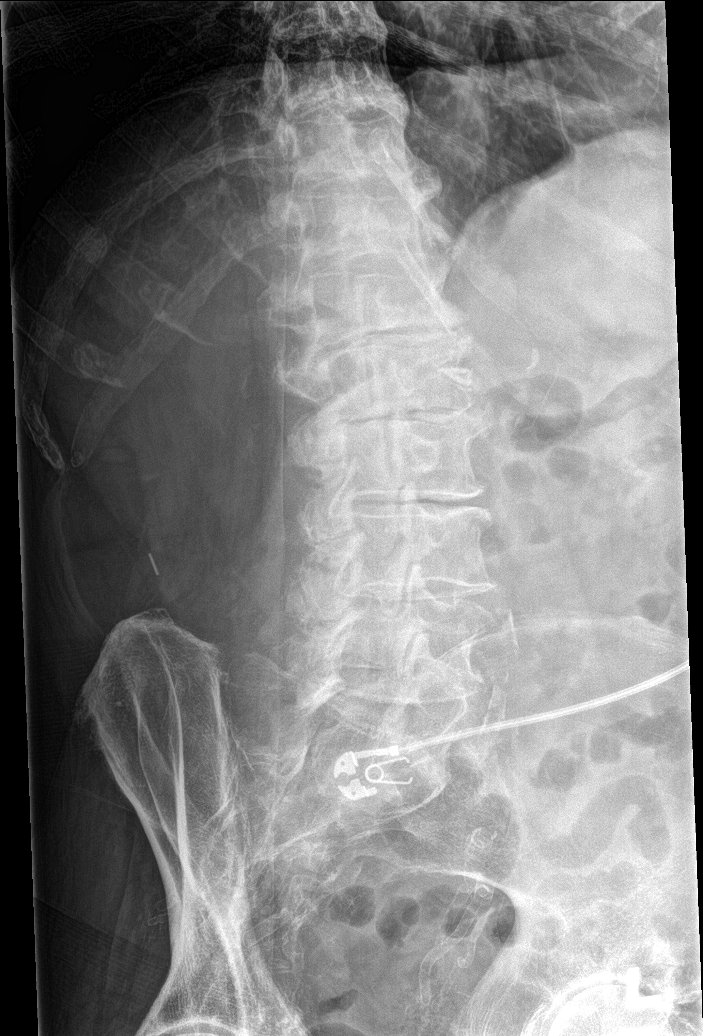

[l-spine lat]
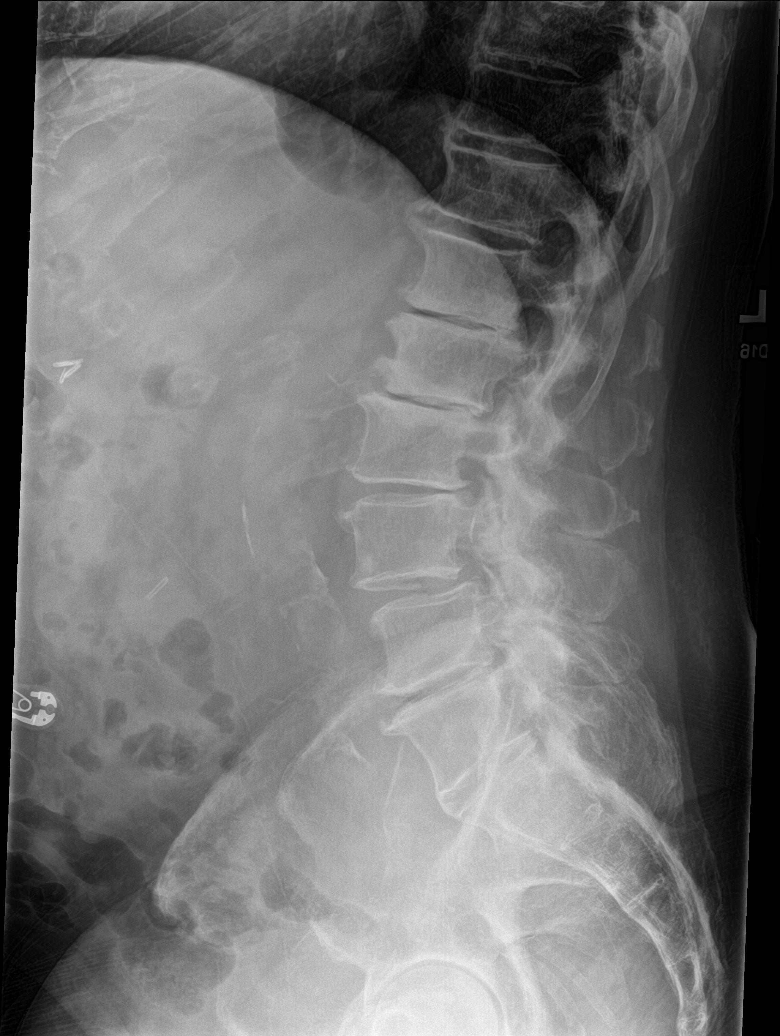

[l-spine spot]
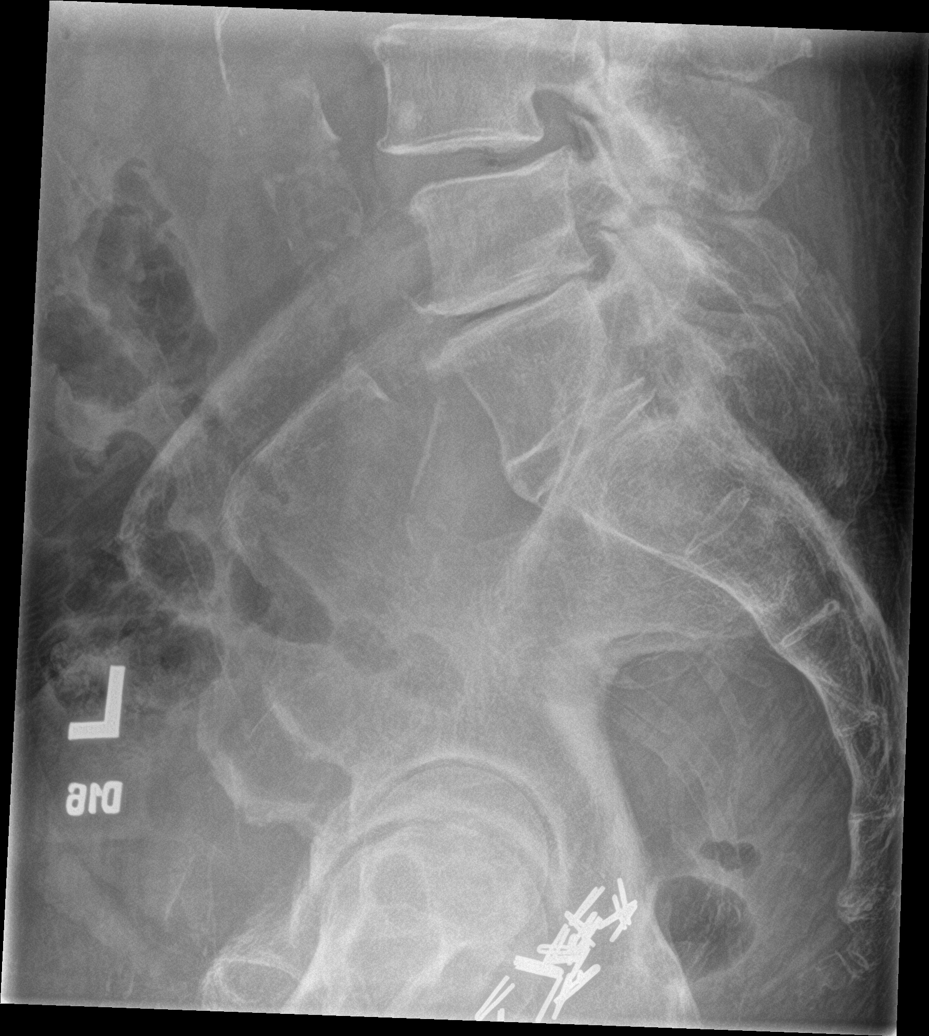

[5 of 5 positions shown; findings below may reference images not displayed]

FINDINGS: No evidence for an acute fracture. There is diffuse loss of
intervertebral disc height in the lumbar spine with diffuse endplate
degeneration. Trace anterolisthesis of L3 on 4 evident.
IMPRESSION: Negative.

## 2021-08-04 IMAGING — DX DG CHEST 1V
1 series · 1 of 1 positions shown · non-contrast
Comparison: Chest x-ray dated [DATE].

CLINICAL DATA: Fall.

EXAM:
CHEST  1 VIEW

[chest ap]
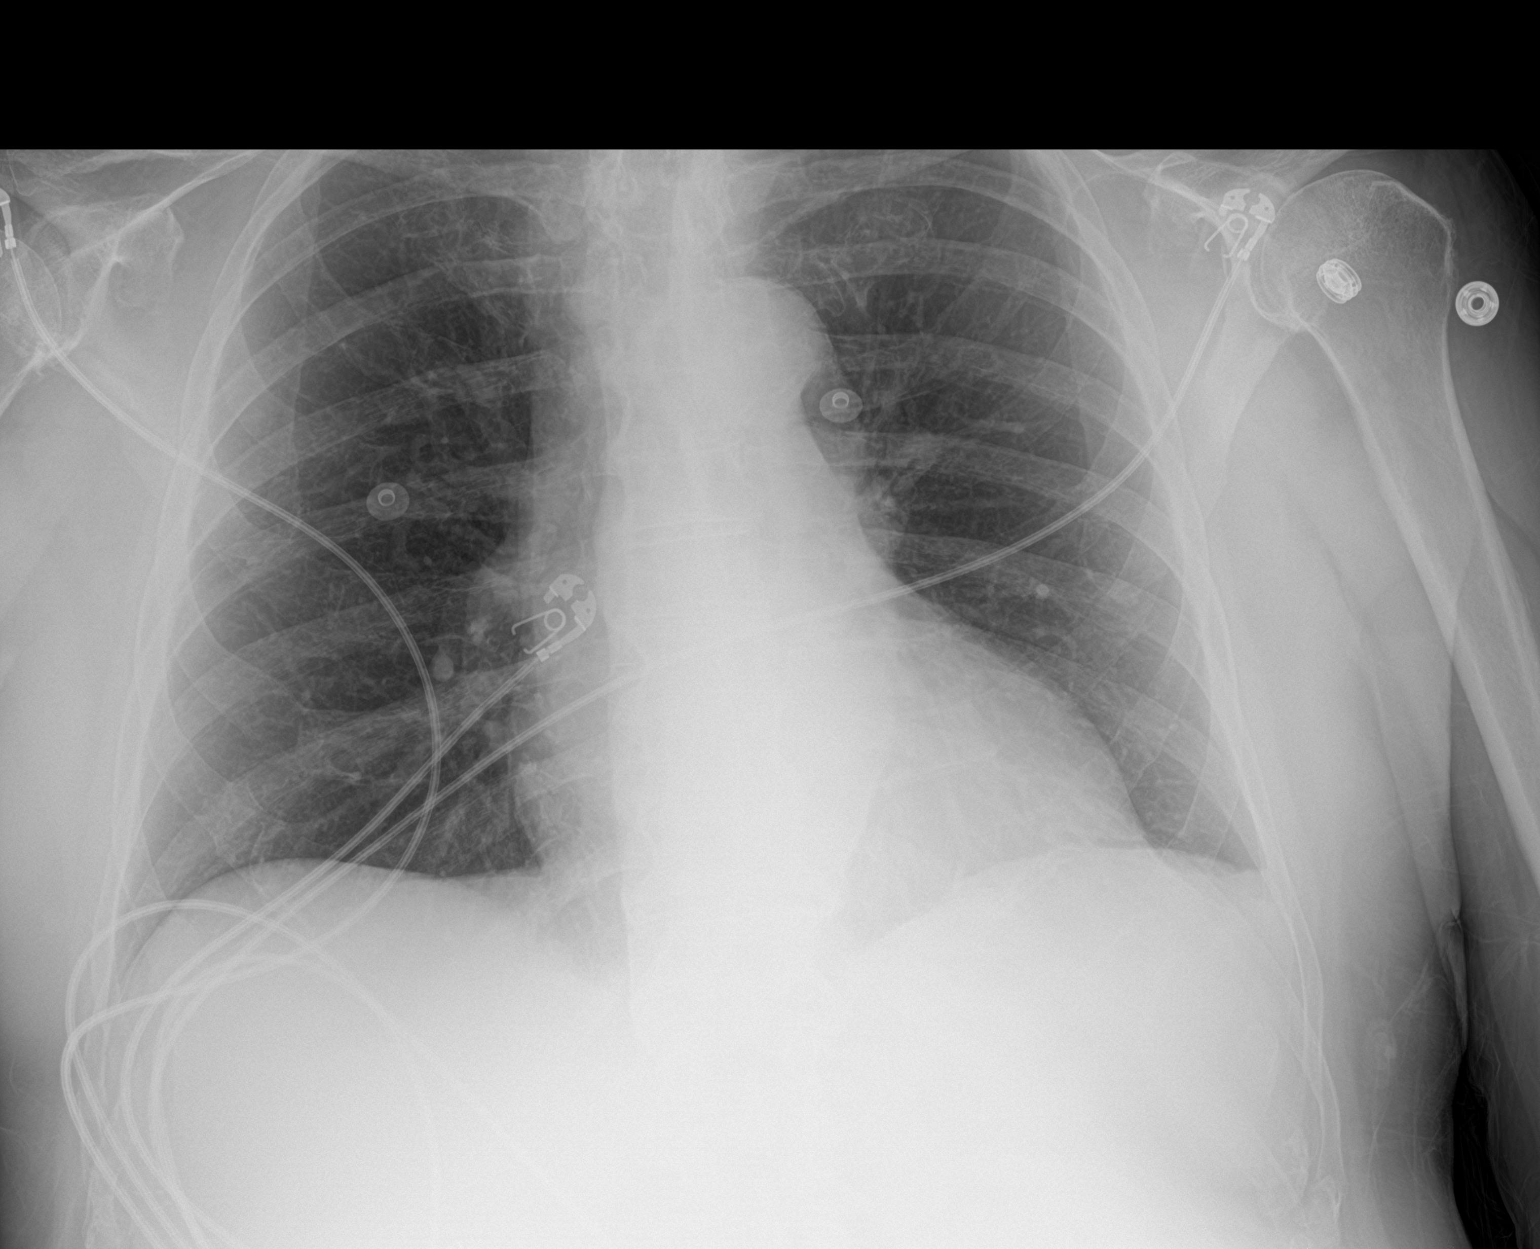

[1 of 1 positions shown; findings below may reference images not displayed]

FINDINGS: Stable cardiomediastinal silhouette. Normal pulmonary vascularity.
Trace left pleural effusion. No consolidation or pneumothorax. No
acute osseous abnormality.
IMPRESSION: 1. Trace left pleural effusion.

## 2021-08-04 IMAGING — MR MR LUMBAR SPINE W/O CM
4 of 5 series · 18 of 48 positions shown · non-contrast
Comparison: Lumbar spine MRI [DATE]

CLINICAL DATA: Lumbar spinal stenosis. Fall. Bilateral lower
extremity pain and weakness.

EXAM:
MRI LUMBAR SPINE WITHOUT CONTRAST
TECHNIQUE: Multiplanar, multisequence MR imaging of the lumbar spine was
performed. No intravenous contrast was administered.

[Series 2: T2 · sagittal · 4.0mm · 0.55mm/px · 7 of 18 slices shown (1 of 2)]
[im 1/18]
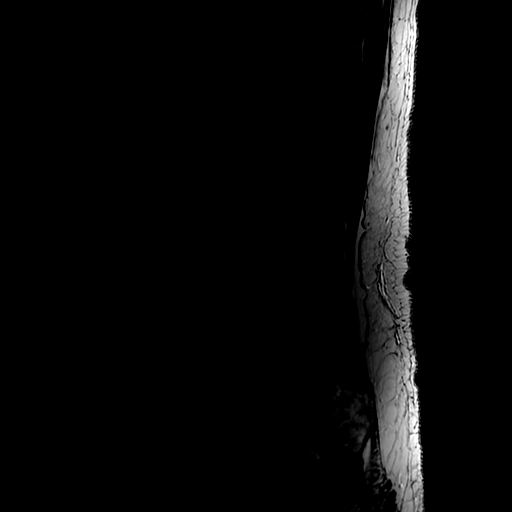
[im 3/18]
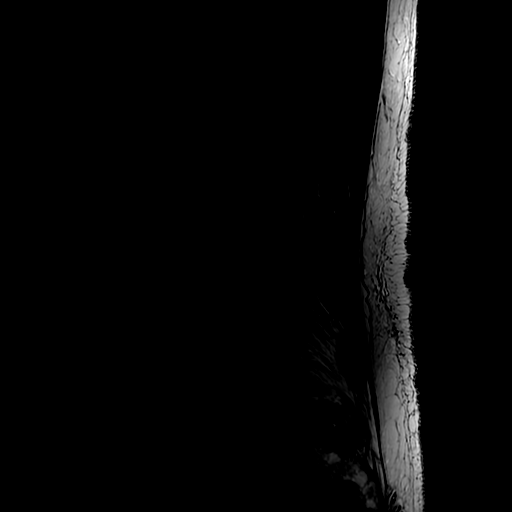
[im 6/18]
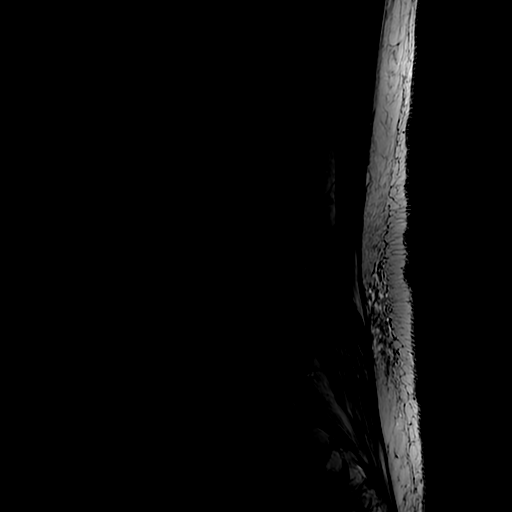
[im 9/18]
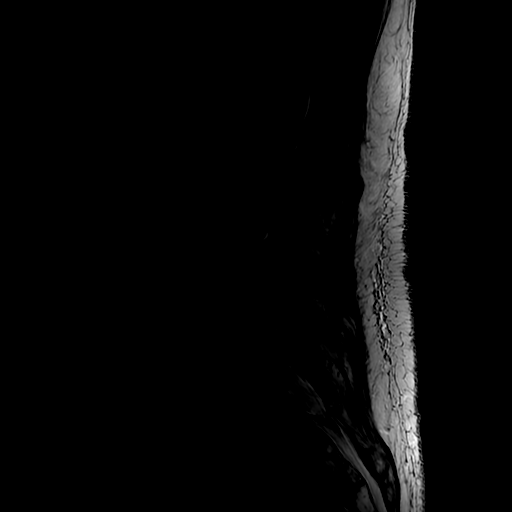
[im 12/18]
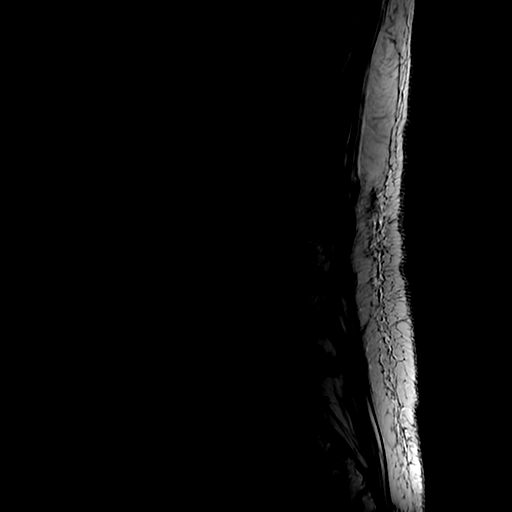
[im 15/18]
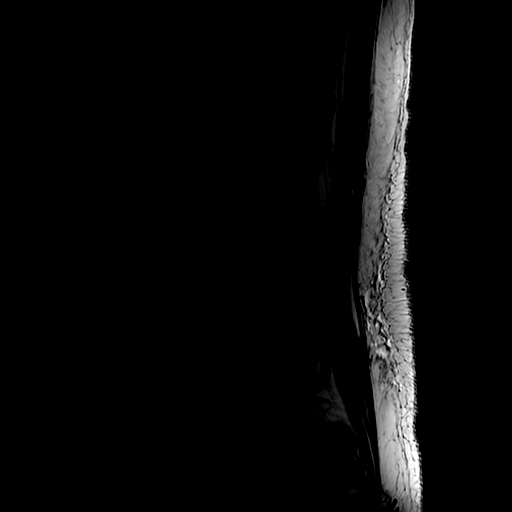
[im 18/18]
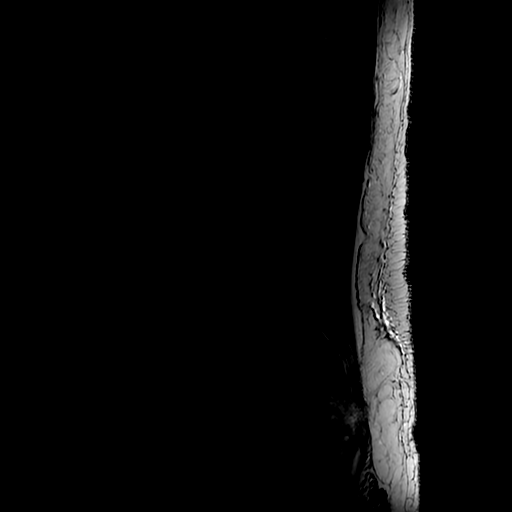

[Series 4: T1 · sagittal · 4.0mm · 0.55mm/px · 3 of 18 slices shown (1 of 2)]
[im 4/18]
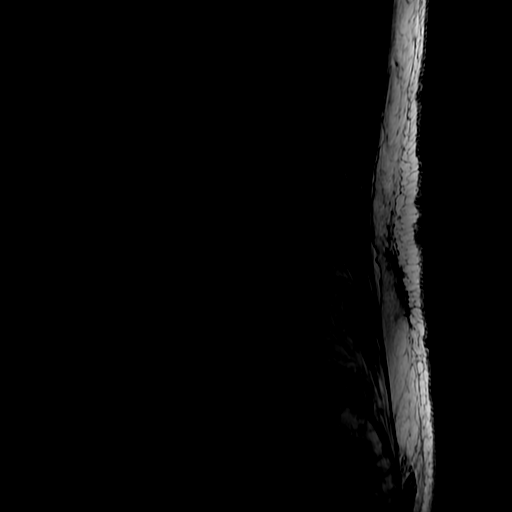
[im 11/18]
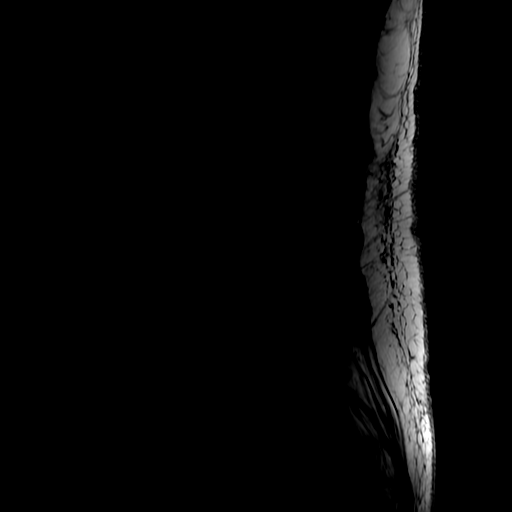
[im 18/18]
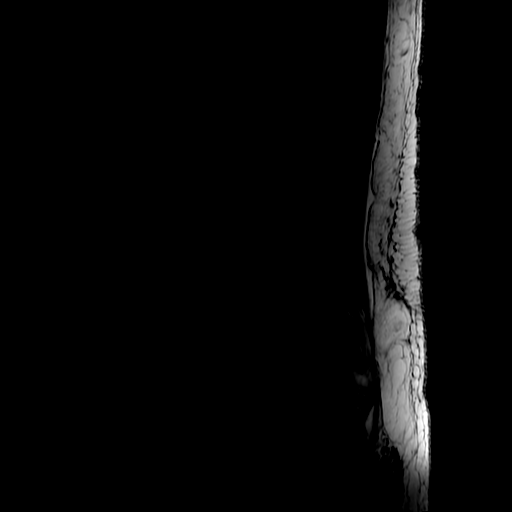

[Series 5: T2 · axial · 4.0mm · 0.39mm/px · z∈[-0,+164]mm · 5 of 40 slices shown (2 of 2)]
[im 1/40]
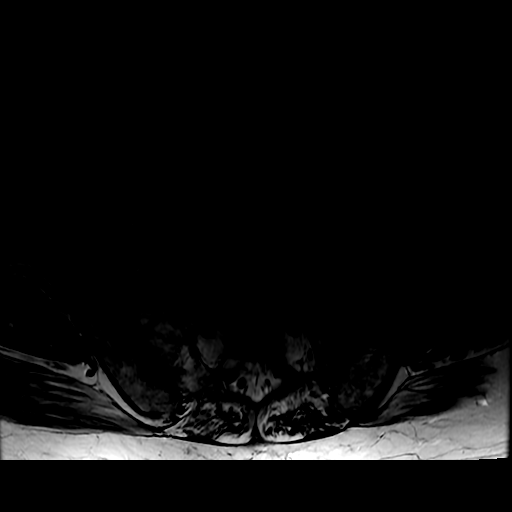
[im 7/40]
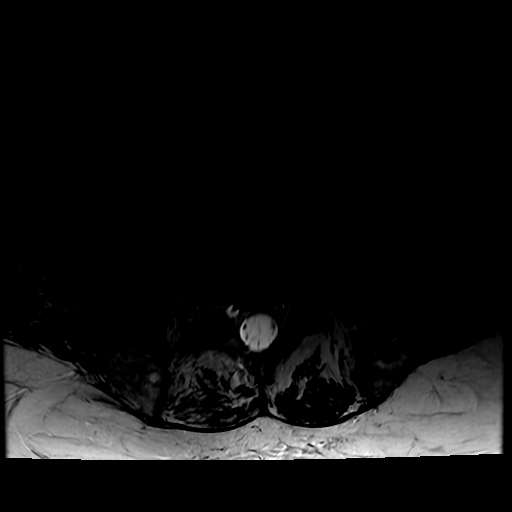
[im 13/40]
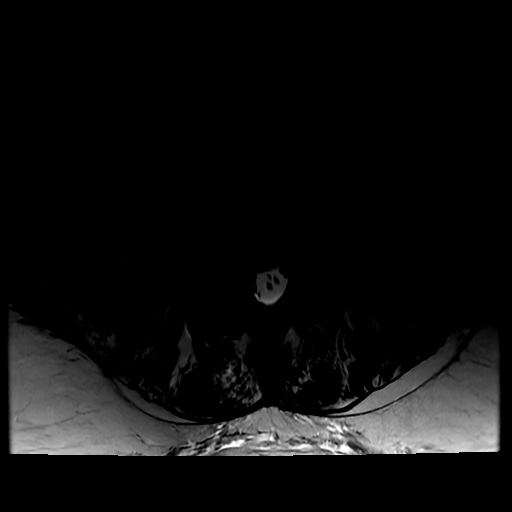
[im 22/40]
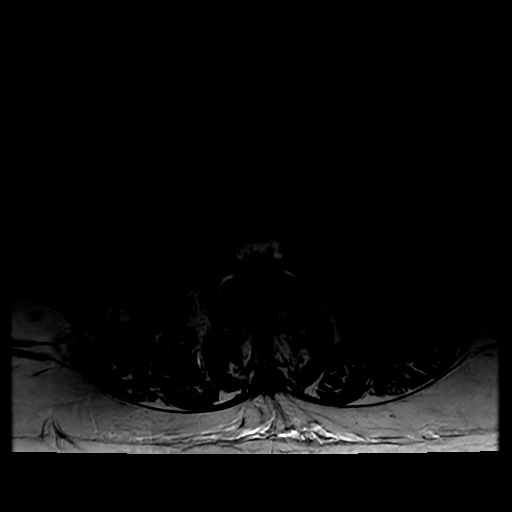
[im 34/40]
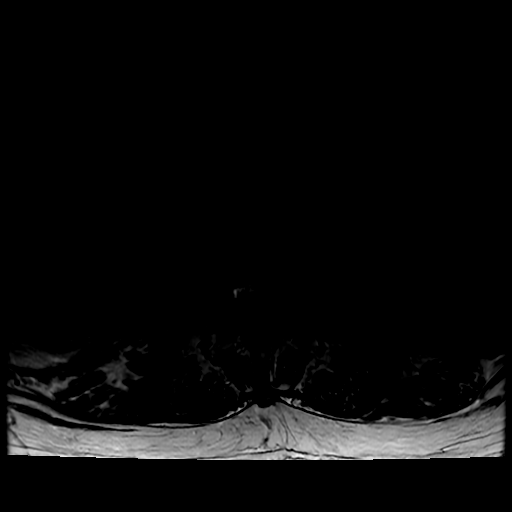

[Series 6: T1 · axial · 4.0mm · 0.39mm/px · z∈[+30,+164]mm · 3 of 40 slices shown (2 of 2)]
[im 7/40]
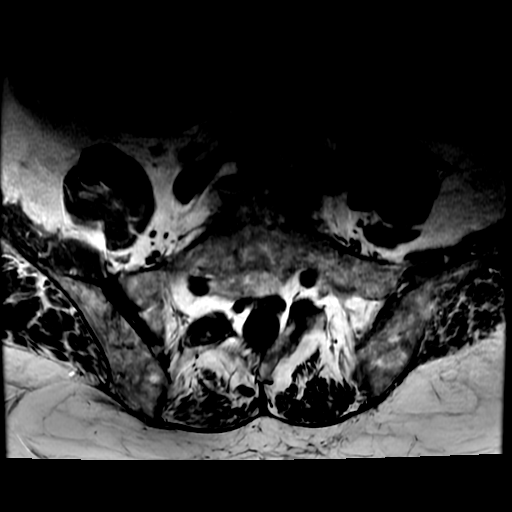
[im 22/40]
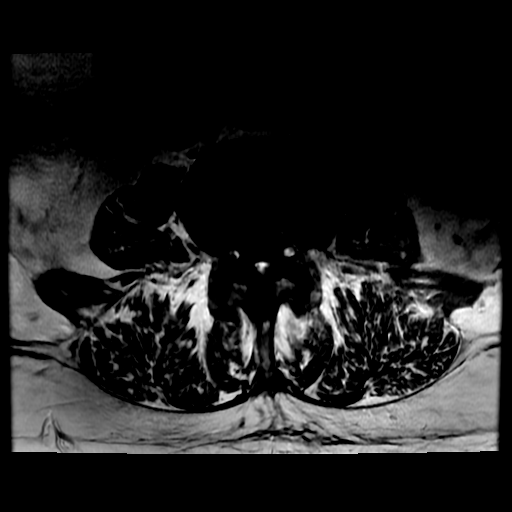
[im 34/40]
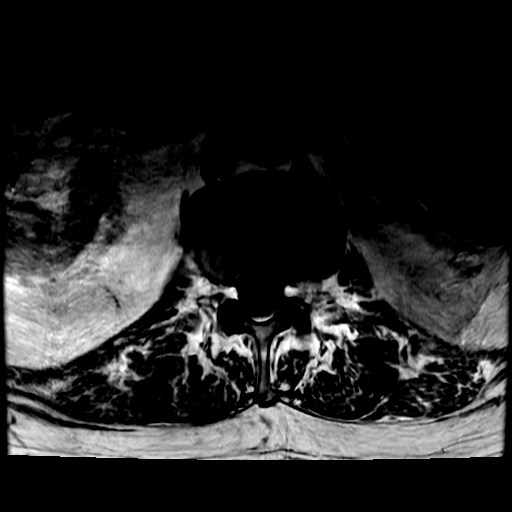

[18 of 48 positions shown; findings below may reference images not displayed]

FINDINGS: Segmentation: Transitional lumbosacral anatomy with partial
lumbarization of S1.

Alignment: Unchanged grade 1 retrolisthesis of L2 on L3 and L5 on S1
and grade 1 anterolisthesis of L4 on L5.

Vertebrae: No fracture or suspicious marrow lesion. Mild
degenerative endplate edema at L1-2 and L2-3.

Conus medullaris and cauda equina: Conus extends to the L1-2 level
and is normal in signal. Redundancy of the cauda equina related to
high-grade spinal stenosis.

Paraspinal and other soft tissues: Unremarkable.

Disc levels:

Disc desiccation and disc space narrowing throughout the lumbar
spine with disc narrowing being most severe at L1-2, L2-3, and
L5-S1.

T12-L1: Negative.

L1-2: Disc bulging, endplate spurring, and moderate facet
hypertrophy result in borderline to mild bilateral neural foraminal
stenosis without spinal stenosis, unchanged.

L2-3: Circumferential disc bulging, prominent dorsal epidural fat,
and moderate to severe facet and ligamentum flavum hypertrophy
result in severe spinal stenosis and moderate bilateral neural
foraminal stenosis, stable to slightly progressed.

L3-4: Circumferential disc bulging, prominent dorsal epidural fat,
and moderate to severe facet and ligamentum flavum hypertrophy
result in moderate spinal stenosis and mild-to-moderate bilateral
neural foraminal stenosis, slightly progressed. Bilateral facet
joint effusions.

L4-5: Anterolisthesis with bulging uncovered disc and severe facet
and ligamentum flavum hypertrophy result in severe spinal stenosis
(3 mm AP spinal canal diameter) and mild bilateral neural foraminal
stenosis, unchanged. Moderate bilateral facet joint effusions.

L5-S1: Disc bulging and endplate spurring eccentric to the right and
moderate facet hypertrophy result in moderate right neural foraminal
stenosis without spinal stenosis, unchanged.

S1-2: Rudimentary disc.  No stenosis.
IMPRESSION: 1. Unchanged, markedly severe spinal stenosis at L4-5.
2. Stable to slight progression of severe spinal stenosis and
moderate neural foraminal stenosis at L2-3.
3. Slight progression of moderate spinal stenosis at L3-4.

## 2021-08-04 MED ORDER — METRONIDAZOLE 500 MG/100ML IV SOLN
500.0000 mg | Freq: Two times a day (BID) | INTRAVENOUS | Status: DC
  Administered 2021-08-05 – 2021-08-06 (×3): 500 mg via INTRAVENOUS
  Filled 2021-08-04 (×3): qty 100

## 2021-08-04 MED ORDER — MONTELUKAST SODIUM 10 MG PO TABS
10.0000 mg | ORAL_TABLET | Freq: Every day | ORAL | Status: DC
  Administered 2021-08-05 – 2021-08-07 (×3): 10 mg via ORAL
  Filled 2021-08-04 (×3): qty 1

## 2021-08-04 MED ORDER — CARVEDILOL 12.5 MG PO TABS
12.5000 mg | ORAL_TABLET | Freq: Two times a day (BID) | ORAL | Status: DC
  Administered 2021-08-05 – 2021-08-06 (×4): 12.5 mg via ORAL
  Filled 2021-08-04 (×4): qty 1

## 2021-08-04 MED ORDER — FLECAINIDE ACETATE 100 MG PO TABS
100.0000 mg | ORAL_TABLET | Freq: Two times a day (BID) | ORAL | Status: DC
  Administered 2021-08-05 – 2021-08-08 (×7): 100 mg via ORAL
  Filled 2021-08-04: qty 1
  Filled 2021-08-04: qty 2
  Filled 2021-08-04: qty 1
  Filled 2021-08-04 (×4): qty 2
  Filled 2021-08-04: qty 1

## 2021-08-04 MED ORDER — ALBUTEROL SULFATE (2.5 MG/3ML) 0.083% IN NEBU: 3.0000 mL | INHALATION_SOLUTION | Freq: Four times a day (QID) | RESPIRATORY_TRACT | Status: DC | PRN

## 2021-08-04 MED ORDER — FLUTICASONE FUROATE-VILANTEROL 100-25 MCG/INH IN AEPB
1.0000 | INHALATION_SPRAY | Freq: Every day | RESPIRATORY_TRACT | Status: DC
Start: 2021-08-05 — End: 2021-08-08
  Filled 2021-08-04 (×4): qty 1

## 2021-08-04 MED ORDER — ONDANSETRON HCL 4 MG/2ML IJ SOLN
4.0000 mg | Freq: Once | INTRAMUSCULAR | Status: AC
  Administered 2021-08-04: 4 mg via INTRAVENOUS
  Filled 2021-08-04: qty 2

## 2021-08-04 MED ORDER — GABAPENTIN 300 MG PO CAPS
600.0000 mg | ORAL_CAPSULE | Freq: Every day | ORAL | Status: DC
  Administered 2021-08-05 – 2021-08-07 (×3): 600 mg via ORAL
  Filled 2021-08-04 (×3): qty 2

## 2021-08-04 MED ORDER — ACETAMINOPHEN 650 MG RE SUPP: 650.0000 mg | Freq: Four times a day (QID) | RECTAL | Status: DC | PRN

## 2021-08-04 MED ORDER — SODIUM CHLORIDE 0.9 % IV SOLN: INTRAVENOUS | Status: DC

## 2021-08-04 MED ORDER — ACETAMINOPHEN 500 MG PO TABS
1000.0000 mg | ORAL_TABLET | Freq: Once | ORAL | Status: AC
  Administered 2021-08-04: 1000 mg via ORAL
  Filled 2021-08-04: qty 2

## 2021-08-04 MED ORDER — SODIUM CHLORIDE 0.9 % IV BOLUS
1000.0000 mL | Freq: Once | INTRAVENOUS | Status: AC
  Administered 2021-08-04: 1000 mL via INTRAVENOUS

## 2021-08-04 MED ORDER — LORAZEPAM 2 MG/ML IJ SOLN: 1.0000 mg | Freq: Once | INTRAMUSCULAR | Status: DC | PRN

## 2021-08-04 MED ORDER — LACTATED RINGERS IV BOLUS (SEPSIS)
1000.0000 mL | Freq: Once | INTRAVENOUS | Status: AC
Start: 2021-08-04 — End: 2021-08-04
  Administered 2021-08-04: 1000 mL via INTRAVENOUS

## 2021-08-04 MED ORDER — ATORVASTATIN CALCIUM 10 MG PO TABS
20.0000 mg | ORAL_TABLET | Freq: Every day | ORAL | Status: DC
  Administered 2021-08-05 – 2021-08-08 (×4): 20 mg via ORAL
  Filled 2021-08-04 (×4): qty 2

## 2021-08-04 MED ORDER — ALLOPURINOL 300 MG PO TABS
300.0000 mg | ORAL_TABLET | Freq: Every day | ORAL | Status: DC
  Administered 2021-08-05 – 2021-08-08 (×4): 300 mg via ORAL
  Filled 2021-08-04 (×4): qty 1

## 2021-08-04 MED ORDER — ACETAMINOPHEN 325 MG PO TABS
650.0000 mg | ORAL_TABLET | Freq: Four times a day (QID) | ORAL | Status: DC | PRN
  Administered 2021-08-05 – 2021-08-07 (×5): 650 mg via ORAL
  Filled 2021-08-04 (×5): qty 2

## 2021-08-04 NOTE — ED Notes (Signed)
Pt returned from MRI °

## 2021-08-04 NOTE — ED Notes (Signed)
Patient transported to X-ray 

## 2021-08-04 NOTE — ED Provider Notes (Signed)
[facility] EMERGENCY DEPARTMENT Provider Note   CSN: [id number] Arrival date & time: 08/04/21  3875     History  Chief Complaint  Patient presents with   Charles Lloyd. is a 81 y.o. male.  Pt is a 81 yo male with a pmhx significant for afib on Eliquis, prostate cancer, skin cancer, htn, chf, hyperlipidemia, gout and dm2.  Pt said he woke up this morning and was pulling on his pants and fell.  He denies hitting his head.  He had no loc.  Pt said he can't feel his legs from the knees down.  Pt said it hurts to move both legs, but that was before he fell.  Pt has a fever here.  He was unaware he had a fever.  He denies any injury from the fall other than a skin tear to the left forearm.  His O2 sats were in the low 90s for EMS, so he was put on 2L oxygen.       Home Medications Prior to Admission medications   Medication Sig Start Date End Date Taking? Authorizing Provider  albuterol (VENTOLIN HFA) 108 (90 Base) MCG/ACT inhaler Inhale 1-2 puffs into the lungs every 6 (six) hours as needed for wheezing or shortness of breath. 10/05/20   Parrett, Fonnie Mu, NP  allopurinol (ZYLOPRIM) 300 MG tablet Take 1 tablet (300 mg total) by mouth daily. 07/18/21   Isaac Bliss, [name], MD  atorvastatin (LIPITOR) 20 MG tablet TAKE 1 TABLET DAILY 06/14/21   Isaac Bliss, [name], MD  carvedilol (COREG) 12.5 MG tablet Take 1 tablet (12.5 mg total) by mouth 2 (two) times daily. 06/27/21   Richardo Priest, MD  colchicine 0.6 MG tablet Take 1 tablet (0.6 mg total) by mouth 2 (two) times daily as needed. 06/23/21   Richardo Priest, MD  ELIQUIS 5 MG TABS tablet TAKE 1 TABLET TWICE A DAY 06/15/21   Isaac Bliss, [name], MD  flecainide (TAMBOCOR) 100 MG tablet Take 1 tablet (100 mg total) by mouth 2 (two) times daily. 05/11/21   Camnitz, Will Hassell Done, MD  fluticasone (FLONASE) 50 MCG/ACT nasal spray Place 2 sprays into both nostrils daily. 01/07/19   Dana Allan I, MD   fluticasone furoate-vilanterol (BREO ELLIPTA) 100-25 MCG/INH AEPB Inhale 1 puff into the lungs daily. 10/05/20   Parrett, Fonnie Mu, NP  gabapentin (NEURONTIN) 300 MG capsule TAKE 2 CAPSULES BY MOUTH AT BEDTIME 04/17/21   Isaac Bliss, [name], MD  glucose blood (ACCU-CHEK AVIVA PLUS) test strip 1 each by Other route daily. Dx E11.9 03/25/20   Isaac Bliss, [name], MD  KLOR-CON M20 20 MEQ tablet Take 20 mEq by mouth daily. 02/04/21   [provider]  montelukast (SINGULAIR) 10 MG tablet Take 10 mg by mouth at bedtime.     [provider]  Multiple Vitamins-Minerals (MULTIVITAMIN WITH MINERALS) tablet Take 2 tablets by mouth daily. Gummies    [provider]  predniSONE (STERAPRED UNI-PAK 21 TAB) 10 MG (21) TBPK tablet Please take Prednisone 40 mg for 2 days, then 20 mg for 2 days, then 10 mg for 2 days then stop. 06/23/21   Richardo Priest, MD  RESTASIS 0.05 % ophthalmic emulsion Place 1 drop into both eyes 2 (two) times daily. 12/09/19   [provider]  saccharomyces boulardii (FLORASTOR) 250 MG capsule Take 1 capsule (250 mg total) by mouth 2 (two) times daily. 11/23/20   Jerilee Hoh  Everardo Beals, MD  torsemide (DEMADEX) 20 MG tablet Take 1 tablet (20 mg total) by mouth daily. 01/05/21   Isaac Bliss, [name], MD  triamcinolone cream (KENALOG) 0.1 % Apply 1 application. topically 2 (two) times daily. 07/18/21   Isaac Bliss, [name], MD  valsartan (DIOVAN) 160 MG tablet Take 1 tablet (160 mg total) by mouth daily. Patient must keep April 2023 appointment for further refills. 3 rd/ final attempt 05/15/21   Richardo Priest, MD      Allergies    Patient has no known allergies.    Review of Systems   Review of Systems  Musculoskeletal:  Positive for back pain.  Neurological:  Positive for numbness.  All other systems reviewed and are negative.   Physical Exam Updated Vital Signs BP (!) 125/58   Pulse 63   Temp 98.8 F (37.1 C) (Oral)    Resp 17   Ht '5\' 10"'$  (1.778 m)   Wt 83.7 kg   SpO2 96%   BMI 26.48 kg/m  Physical Exam Vitals and nursing note reviewed.  Constitutional:      Appearance: Normal appearance. He is obese.  HENT:     Head: Normocephalic and atraumatic.     Right Ear: External ear normal.     Left Ear: External ear normal.     Nose: Nose normal.     Mouth/Throat:     Mouth: Mucous membranes are dry.  Eyes:     Extraocular Movements: Extraocular movements intact.     Conjunctiva/sclera: Conjunctivae normal.     Pupils: Pupils are equal, round, and reactive to light.  Cardiovascular:     Rate and Rhythm: Normal rate and regular rhythm.     Pulses: Normal pulses.     Heart sounds: Normal heart sounds.  Pulmonary:     Effort: Pulmonary effort is normal.     Breath sounds: Normal breath sounds.  Abdominal:     General: Abdomen is flat. Bowel sounds are normal.     Palpations: Abdomen is soft.  Musculoskeletal:        General: Normal range of motion.     Cervical back: Normal range of motion and neck supple.  Skin:    General: Skin is warm.     Capillary Refill: Capillary refill takes less than 2 seconds.     Comments: Skin tear left forearm  Neurological:     Mental Status: He is alert and oriented to person, place, and time.     Comments: Weakness and numbness in both lower legs  Psychiatric:        Mood and Affect: Mood normal.        Behavior: Behavior normal.     ED Results / Procedures / Treatments   Labs (all labs ordered are listed, but only abnormal results are displayed) Labs Reviewed  COMPREHENSIVE METABOLIC PANEL - Abnormal; Notable for the following components:      Result Value   Sodium 134 (*)    Glucose, Bld 180 (*)    Creatinine, Ser 1.70 (*)    Calcium 8.1 (*)    Total Protein 6.1 (*)    Albumin 3.1 (*)    AST 45 (*)    GFR, Estimated 41 (*)    All other components within normal limits  CBC WITH DIFFERENTIAL/PLATELET - Abnormal; Notable for the following  components:   WBC 3.0 (*)    RBC 3.50 (*)    Hemoglobin 10.6 (*)    HCT  32.8 (*)    Platelets 95 (*)    Lymphs Abs 0.4 (*)    All other components within normal limits  PROTIME-INR - Abnormal; Notable for the following components:   Prothrombin Time 19.4 (*)    INR 1.7 (*)    All other components within normal limits  SARS CORONAVIRUS 2 BY RT PCR  CULTURE, BLOOD (ROUTINE X 2)  CULTURE, BLOOD (ROUTINE X 2)  URINE CULTURE  LACTIC ACID, PLASMA  APTT  URINALYSIS, ROUTINE W REFLEX MICROSCOPIC    EKG EKG Interpretation  Date/Time:  Friday August 04 2021 10:43:21 EDT Ventricular Rate:  75 PR Interval:  240 QRS Duration: 126 QT Interval:  406 QTC Calculation: 453 R Axis:   53 Text Interpretation: Sinus rhythm with 1st degree A-V block Non-specific intra-ventricular conduction block Nonspecific T wave abnormality Abnormal ECG When compared with ECG of 04-Aug-2021 10:42, PREVIOUS ECG IS PRESENT No significant change since last tracing Confirmed by Isla Pence ([id number]) on 08/04/2021 11:03:38 AM  Radiology DG Chest 1 View  Result Date: 08/04/2021 CLINICAL DATA:  Fall. EXAM: CHEST  1 VIEW COMPARISON:  Chest x-ray dated December 30, 2020. FINDINGS: Stable cardiomediastinal silhouette. Normal pulmonary vascularity. Trace left pleural effusion. No consolidation or pneumothorax. No acute osseous abnormality. IMPRESSION: 1. Trace left pleural effusion. Electronically Signed   By: Titus Dubin M.D.   On: 08/04/2021 11:34   DG Lumbar Spine Complete  Result Date: 08/04/2021 CLINICAL DATA:  Fall . EXAM: LUMBAR SPINE - COMPLETE 4+ VIEW COMPARISON:  None Available. FINDINGS: No evidence for an acute fracture. There is diffuse loss of intervertebral disc height in the lumbar spine with diffuse endplate degeneration. Trace anterolisthesis of L3 on 4 evident. IMPRESSION: Negative. Electronically Signed   By: Misty Stanley M.D.   On: 08/04/2021 11:30    Procedures Procedures    Medications Ordered  in ED Medications  LORazepam (ATIVAN) injection 1 mg (has no administration in time range)  lactated ringers bolus 1,000 mL (0 mLs Intravenous Stopped 08/04/21 1359)  acetaminophen (TYLENOL) tablet 1,000 mg (1,000 mg Oral Given 08/04/21 1053)  sodium chloride 0.9 % bolus 1,000 mL (1,000 mLs Intravenous New Bag/Given 08/04/21 1419)    ED Course/ Medical Decision Making/ A&P                           Medical Decision Making Amount and/or Complexity of Data Reviewed Labs: ordered. Radiology: ordered. ECG/medicine tests: ordered.  Risk OTC drugs. Prescription drug management.   This patient presents to the ED for concern of fall with bilateral leg weakness, this involves an extensive number of treatment options, and is a complaint that carries with it a high risk of complications and morbidity.  The differential diagnosis includes spinal stenosis, infection   Co morbidities that complicate the patient evaluation   afib on Eliquis, prostate cancer, skin cancer, htn, chf, hyperlipidemia, gout and dm2   Additional history obtained:  Additional history obtained from epic chart review External records from outside source obtained and reviewed including EMS report   Lab Tests:  I Ordered, and personally interpreted labs.  The pertinent results include:  wbc 3.o, hgb 10.6, plt low at 95; cmp with cr 1.7 (slightly worse than nl); lactic nl, covid neg   Imaging Studies ordered:  I ordered imaging studies including cxr and lumbar spine I independently visualized and interpreted imaging which showed  CXR: IMPRESSION:  1. Trace left pleural effusion.  Lumbar spine: IMPRESSION:  Negative.   I agree with the radiologist interpretation   Cardiac Monitoring:  The patient was maintained on a cardiac monitor.  I personally viewed and interpreted the cardiac monitored which showed an underlying rhythm of: nsr   Medicines ordered and prescription drug management:  I ordered medication  including ivfs  for dehydration  Reevaluation of the patient after these medicines showed that the patient improved I have reviewed the patients home medicines and have made adjustments as needed   Test Considered:  Mri lumbar   Problem List / ED Course:  Fever:  unkn source.  Urine still pending Bilateral leg weakness:  MRI lumbar pending.  Doubt cva as it is bilateral.  Hx spinal stenosis.   Reevaluation:  After the interventions noted above, I reevaluated the patient and found that they have :improved   Social Determinants of Health:  Lives at home   Dispostion: Pt signed out to Dr. Zenia Resides at shift change.       Final Clinical Impression(s) / ED Diagnoses Final diagnoses:  Pancytopenia ([place])  Fall, initial encounter  Bilateral leg weakness    Rx / DC Orders ED Discharge Orders     None         Isla Pence, MD 08/04/21 1551

## 2021-08-04 NOTE — ED Triage Notes (Signed)
Pt arrived via [facility] from home for a mechanical fall. Pt was putting on his pants and fell. Pt denies hitting and denies LOC.  Pt is A&Ox4. Pt has skin tear on left elbow. Pt  denies any pain, except the chronic lower back pain.

## 2021-08-04 NOTE — H&P (Addendum)
History and Physical    Charles Lloyd. TKZ:[id number] DOB: 1940/11/26 DOA: 08/04/2021  PCP: Isaac Bliss, [name], MD  Patient coming from: Home  Chief Complaint: Fall  HPI: Charles Alms. is a 81 y.o. male with medical history significant of chronic HFpEF, A-fib on Eliquis, hypertension, hyperlipidemia, type 2 diabetes, history of skin and prostate cancer, gout presented to the ED with bilateral lower extremity weakness/numbness after a mechanical fall at home.  Febrile on arrival with temperature 102.4 F.  Not tachycardic or hypotensive.  Labs showing WBC 3.0, hemoglobin 10.6 (stable), platelet count 95k.  Sodium 134, potassium 3.6, chloride 104, bicarb 25, BUN 22, creatinine 1.7 (baseline 1.2-1.4), glucose 180.  No significant elevation of LFTs.  SARS-CoV-2 PCR negative.  Lactic acid 1.8.  PT 19.4, INR 1.7.  Blood cultures drawn.  UA not suggestive of infection.  Urine culture pending.  Chest x-ray not suggestive of pneumonia; showing trace left pleural effusion.  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  Stable to slight progression of severe spinal stenosis and moderate neural foraminal stenosis at L2-3.  Also showing slight progression of moderate spinal stenosis at L3-4. ED physician discussed the case with neurosurgery, will consult.  Patient was given Tylenol, Zofran, and 2 L IV fluid boluses.  Patient states after he woke up this morning he noticed that his legs were very weak and numb from the knees down.  He fell while trying to put on his pants but did not injure himself.  Denies striking his head or losing consciousness.  Reports history of chronic low back pain and states he was seen by a spine specialist 6 months ago and x-rays were done.  He denies saddle anesthesia or bowel/bladder dysfunction.  States the weakness in his legs has now improved and they no longer feel numb.  States when EMS came to pick him up they told him that he  was feeling very warm.  States he was coughing up some phlegm when they took him for MRI but denies vomiting.  Denies abdominal pain or diarrhea.  Denies shortness of breath or chest pain.  Review of Systems:  Review of Systems  All other systems reviewed and are negative.   Past Medical History:  Diagnosis Date   Acute tracheobronchitis 01/05/2019   B12 deficiency    Basal cell carcinoma (BCC) of left side of nose 01/28/2018   Cancer ([place])    Cardiomyopathy ([place]) 03/10/2018   With chronic atrial fibrillation   CHF (congestive heart failure) ([place]) 04/16/2013   Functional class II, ejection fraction 35-40%  Formatting of this note might be different from the original. Functional class II, ejection fraction 35-40%   Chronic anticoagulation    Chronic diastolic (congestive) heart failure ([place]) 04/16/2013   Functional class II, ejection fraction 35-40%  Last Assessment & Plan:  Clinically stable Volume well controlled meds reviewed   Chronic diastolic CHF (congestive heart failure) ([place]) 04/16/2013   Functional class II, ejection fraction 35-40%  Last Assessment & Plan:  Clinically stable Volume well controlled meds reviewed   Colon polyps 04/16/2013   Diabetic neuropathy (HCC)    Eosinophil count raised 02/17/2019   Essential hypertension 01/06/2010   Last Assessment & Plan:  Well controlled Continue med management   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  Steroid usage was discontinued several months ago.  Follow up with Dermatology as planned.  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[employer]) 06/24/2017   Last Assessment & Plan:  Id  following ?ongoing abx managementy reported Request records   Infection of prosthetic right knee joint ([place])    Insomnia 03/04/2012   Grief with loss of wife 02/20/12  Last Assessment & Plan:  Improved sx  contineu xanax prn Se discussed   Lesion of liver 04/20/2019   -On cardiac CT 02/2019   Mild reactive airways disease    Neoplasm of prostate 04/16/2013   Neoplasm of prostate, malignant ([place]) 01/06/2010   [name] S/p radiation therapy   Last Assessment & Plan:  Repeat psa today No urinary sx Pt reported fatigue/poor appetite   On amiodarone therapy 09/07/2013   On continuous oral anticoagulation 01/28/2018   Other activity(E029.9) 04/20/2013   Formatting of this note might be different from the original. Transthoracic Echocardiogram-03/10/2013-[facility]: Normal left ventricular wall thickness and cavity size.  Global left ventricular systolic function is moderately reduced.  The estimated ejection fraction is 35-40%.  The left atrium is moderately enlarged.  The right atrium is mildly enlarged.  No significant valvular    Overweight (BMI 25.0-29.9) 10/22/2016   Persistent atrial fibrillation ([place]) 04/16/2013   Last Assessment & Plan:  Rate controlled Continue med management eliquis for stroke preventino  Formatting of this note might be different from the original.  Drug  HX Current Rx Pre-ABL inefficacy Pre-ABL intolerant Post-ABL inefficacy Post-ABL intolerant max dose/24h M/Y end comments  sotalol                  dofetilide                  flecainide                  propafenone                  am   S/P TKR (total knee replacement), bilateral    Secondary hypercoagulable state ([place]) 04/09/2019   Tinea cruris 04/16/2013   Type 2 diabetes mellitus with diabetic polyneuropathy, without long-term current use of insulin ([place]) 04/16/2013   Last Assessment & Plan:  Labs today Pt reports well controlled on ambulatory monitoring   Vitamin D deficiency 07/25/2018    Past Surgical History:   Procedure Laterality Date   ATRIAL FIBRILLATION ABLATION N/A 03/12/2019   Procedure: ATRIAL FIBRILLATION ABLATION;  Surgeon: [name], MD;  Location: [place] [facility];  Service: Cardiovascular;  Laterality: N/A;   ATRIAL FIBRILLATION ABLATION N/A 04/29/2020   Procedure: ATRIAL FIBRILLATION ABLATION;  Surgeon: [name], MD;  Location: [place] [facility];  Service: Cardiovascular;  Laterality: N/A;   CARDIAC ELECTROPHYSIOLOGY STUDY AND ABLATION     CARDIOVERSION     CARDIOVERSION N/A 10/10/2018   Procedure: CARDIOVERSION;  Surgeon: [name], MD;  Location: [place];  Service: Cardiovascular;  Laterality: N/A;   CARDIOVERSION N/A 05/18/2020   Procedure: CARDIOVERSION;  Surgeon: [name], MD;  Location: [facility] ENDOSCOPY;  Service: Cardiovascular;  Laterality: N/A;   CARDIOVERSION N/A 12/09/2020   Procedure: CARDIOVERSION;  Surgeon: Lelon Perla, MD;  Location: [facility] ENDOSCOPY;  Service: Cardiovascular;  Laterality: N/A;  CHOLECYSTECTOMY     PROSTATECTOMY     REPLACEMENT TOTAL KNEE BILATERAL       reports that he has never smoked. He has never used smokeless tobacco. He reports current alcohol use of about 14.0 standard drinks of alcohol per week. He reports that he does not use drugs.  No Known Allergies  Family History  Problem Relation Age of Onset   Cancer Mother    Depression Mother    Early death Mother    Cancer Father    Depression Father    Early death Father     Prior to Admission medications   Medication Sig Start Date End Date Taking? Authorizing Provider  albuterol (VENTOLIN HFA) 108 (90 Base) MCG/ACT inhaler Inhale 1-2 puffs into the lungs every 6 (six) hours as needed for wheezing or shortness of breath. 10/05/20  Yes Parrett, Tammy S, NP  allopurinol (ZYLOPRIM) 300 MG tablet Take 1 tablet (300 mg total) by mouth daily. 07/18/21  Yes Isaac Bliss, [name], MD  atorvastatin (LIPITOR) 20 MG tablet TAKE 1 TABLET DAILY Patient  taking differently: Take 20 mg by mouth daily. 06/14/21  Yes Isaac Bliss, [name], MD  carvedilol (COREG) 12.5 MG tablet Take 1 tablet (12.5 mg total) by mouth 2 (two) times daily. Patient taking differently: Take 12.5 mg by mouth every evening. 06/27/21  Yes Munley, Hilton Cork, MD  ELIQUIS 5 MG TABS tablet TAKE 1 TABLET TWICE A DAY Patient taking differently: Take 5 mg by mouth 2 (two) times daily. 06/15/21  Yes Isaac Bliss, [name], MD  flecainide (TAMBOCOR) 100 MG tablet Take 1 tablet (100 mg total) by mouth 2 (two) times daily. 05/11/21  Yes Camnitz, Will Hassell Done, MD  fluticasone (FLONASE) 50 MCG/ACT nasal spray Place 2 sprays into both nostrils daily. Patient taking differently: Place 2 sprays into both nostrils daily as needed for allergies. 01/07/19  Yes Dana Allan I, MD  fluticasone furoate-vilanterol (BREO ELLIPTA) 100-25 MCG/INH AEPB Inhale 1 puff into the lungs daily. Patient taking differently: Inhale 1 puff into the lungs daily as needed (SOB). 10/05/20  Yes Parrett, Tammy S, NP  gabapentin (NEURONTIN) 300 MG capsule TAKE 2 CAPSULES BY MOUTH AT BEDTIME Patient taking differently: Take 600 mg by mouth at bedtime. 04/17/21  Yes Erline Hau, MD  KLOR-CON M20 20 MEQ tablet Take 20 mEq by mouth daily. 02/04/21  Yes [provider]  montelukast (SINGULAIR) 10 MG tablet Take 10 mg by mouth at bedtime.    Yes [provider]  RESTASIS 0.05 % ophthalmic emulsion Place 1 drop into both eyes 2 (two) times daily. 12/09/19  Yes [provider]  torsemide (DEMADEX) 20 MG tablet Take 1 tablet (20 mg total) by mouth daily. 01/05/21  Yes Isaac Bliss, [name], MD  triamcinolone cream (KENALOG) 0.1 % Apply 1 application. topically 2 (two) times daily. Patient taking differently: Apply 1 application  topically daily as needed (itching). 07/18/21  Yes Isaac Bliss, [name], MD  valsartan (DIOVAN) 160 MG tablet Take 1 tablet (160 mg total) by mouth  daily. Patient must keep April 2023 appointment for further refills. 3 rd/ final attempt Patient taking differently: Take 160 mg by mouth daily. 05/15/21  Yes Richardo Priest, MD  colchicine 0.6 MG tablet Take 1 tablet (0.6 mg total) by mouth 2 (two) times daily as needed. Patient not taking: Reported on 08/04/2021 06/23/21   Richardo Priest, MD  glucose blood (ACCU-CHEK AVIVA PLUS) test strip 1 each by  Other route daily. Dx E11.9 03/25/20   Isaac Bliss, [name], MD  predniSONE (STERAPRED UNI-PAK 21 TAB) 10 MG (21) TBPK tablet Please take Prednisone 40 mg for 2 days, then 20 mg for 2 days, then 10 mg for 2 days then stop. Patient not taking: Reported on 08/04/2021 06/23/21   Richardo Priest, MD  saccharomyces boulardii (FLORASTOR) 250 MG capsule Take 1 capsule (250 mg total) by mouth 2 (two) times daily. Patient not taking: Reported on 08/04/2021 11/23/20   Isaac Bliss, [name], MD    Physical Exam: Vitals:   08/04/21 1830 08/04/21 2015 08/04/21 2100 08/04/21 2130  BP: (!) 133/96 112/78 140/69 120/62  Pulse: 77 72 77 74  Resp: 18 17 (!) 21 (!) 25  Temp:      TempSrc:      SpO2: 92% 93% 95% 90%  Weight:      Height:        Physical Exam Vitals reviewed.  Constitutional:      General: He is not in acute distress. HENT:     Head: Normocephalic and atraumatic.  Eyes:     Extraocular Movements: Extraocular movements intact.     Conjunctiva/sclera: Conjunctivae normal.  Cardiovascular:     Rate and Rhythm: Normal rate and regular rhythm.     Pulses: Normal pulses.  Pulmonary:     Effort: Pulmonary effort is normal. No respiratory distress.     Breath sounds: No wheezing or rales.  Abdominal:     General: Bowel sounds are normal. There is no distension.     Palpations: Abdomen is soft.     Tenderness: There is no abdominal tenderness.  Musculoskeletal:        General: No swelling or tenderness.     Cervical back: Normal range of motion. No rigidity.  Skin:    General: Skin is  warm and dry.  Neurological:     General: No focal deficit present.     Mental Status: He is alert and oriented to person, place, and time.     Cranial Nerves: No cranial nerve deficit.     Sensory: No sensory deficit.     Motor: No weakness.     Comments: Strength 5 out of 5 in bilateral lower extremities and sensation to light touch intact.      Labs on Admission: I have personally reviewed following labs and imaging studies  CBC: Recent Labs  Lab 08/04/21 1030  WBC 3.0*  NEUTROABS 2.3  HGB 10.6*  HCT 32.8*  MCV 93.7  PLT 95*   Basic Metabolic Panel: Recent Labs  Lab 08/04/21 1030  NA 134*  K 3.6  CL 104  CO2 25  GLUCOSE 180*  BUN 22  CREATININE 1.70*  CALCIUM 8.1*   GFR: Estimated Creatinine Clearance: 37 mL/min (A) (by C-G formula based on SCr of 1.7 mg/dL (H)). Liver Function Tests: Recent Labs  Lab 08/04/21 1030  AST 45*  ALT 24  ALKPHOS 85  BILITOT 1.0  PROT 6.1*  ALBUMIN 3.1*   No results for input(s): "LIPASE", "AMYLASE" in the last 168 hours. No results for input(s): "AMMONIA" in the last 168 hours. Coagulation Profile: Recent Labs  Lab 08/04/21 1030  INR 1.7*   Cardiac Enzymes: No results for input(s): "CKTOTAL", "CKMB", "CKMBINDEX", "TROPONINI" in the last 168 hours. BNP (last 3 results) No results for input(s): "PROBNP" in the last 8760 hours. HbA1C: No results for input(s): "HGBA1C" in the last 72 hours. CBG: No results for input(s): "  GLUCAP" in the last 168 hours. Lipid Profile: No results for input(s): "CHOL", "HDL", "LDLCALC", "TRIG", "CHOLHDL", "LDLDIRECT" in the last 72 hours. Thyroid Function Tests: No results for input(s): "TSH", "T4TOTAL", "FREET4", "T3FREE", "THYROIDAB" in the last 72 hours. Anemia Panel: No results for input(s): "VITAMINB12", "FOLATE", "FERRITIN", "TIBC", "IRON", "RETICCTPCT" in the last 72 hours. Urine analysis:    Component Value Date/Time   COLORURINE YELLOW 08/04/2021 1611   APPEARANCEUR CLEAR  08/04/2021 1611   LABSPEC 1.014 08/04/2021 1611   PHURINE 5.0 08/04/2021 1611   GLUCOSEU NEGATIVE 08/04/2021 1611   HGBUR NEGATIVE 08/04/2021 1611   BILIRUBINUR NEGATIVE 08/04/2021 1611   KETONESUR NEGATIVE 08/04/2021 1611   PROTEINUR 30 (A) 08/04/2021 1611   NITRITE NEGATIVE 08/04/2021 1611   LEUKOCYTESUR NEGATIVE 08/04/2021 1611    Radiological Exams on Admission: I have personally reviewed images MR LUMBAR SPINE WO CONTRAST  Result Date: 08/04/2021 CLINICAL DATA:  Lumbar spinal stenosis. Fall. Bilateral lower extremity pain and weakness. EXAM: MRI LUMBAR SPINE WITHOUT CONTRAST TECHNIQUE: Multiplanar, multisequence MR imaging of the lumbar spine was performed. No intravenous contrast was administered. 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 Alignment: Unchanged grade 1 retrolisthesis of L2 on L3 and L5 on S1 and grade 1 anterolisthesis of L4 on L5. Vertebrae: No fracture or suspicious marrow lesion. Mild degenerative endplate edema at G2-5 and L2-3. Conus medullaris and cauda equina: Conus extends to the L1-2 level and is normal in signal. Redundancy of the cauda equina related to high-grade spinal stenosis. Paraspinal and other soft tissues: Unremarkable. Disc levels: Disc desiccation and disc space narrowing throughout the lumbar spine with disc narrowing being most severe at L1-2, L2-3, and L5-S1. T12-L1: Negative. L1-2: Disc bulging, endplate spurring, and moderate facet hypertrophy result in borderline to mild bilateral neural foraminal stenosis without spinal stenosis, unchanged. L2-3: Circumferential disc bulging, prominent dorsal epidural fat, and moderate to severe facet and ligamentum flavum hypertrophy result in severe spinal stenosis and moderate bilateral neural foraminal stenosis, stable to slightly progressed. L3-4: Circumferential disc bulging, prominent dorsal epidural fat, and moderate to  severe facet and ligamentum flavum hypertrophy result in moderate spinal stenosis and mild-to-moderate bilateral neural foraminal stenosis, slightly progressed. Bilateral facet joint effusions. L4-5: Anterolisthesis with bulging uncovered disc and severe facet and ligamentum flavum hypertrophy result in severe spinal stenosis (3 mm AP spinal canal diameter) and mild bilateral neural foraminal stenosis, unchanged. Moderate bilateral facet joint effusions. L5-S1: Disc bulging and endplate spurring eccentric to the right and moderate facet hypertrophy result in moderate right neural foraminal stenosis without spinal stenosis, unchanged. S1-2: Rudimentary disc.  No stenosis. IMPRESSION: 1. 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 2. Stable to slight progression of severe spinal stenosis and moderate neural foraminal stenosis at L2-3. 3. Slight progression of moderate spinal stenosis at L3-4. Electronically Signed   By: Logan Bores M.D.   On: 08/04/2021 20:22   DG Chest 1 View  Result Date: 08/04/2021 CLINICAL DATA:  Fall. EXAM: CHEST  1 VIEW COMPARISON:  Chest x-ray dated December 30, 2020. FINDINGS: Stable cardiomediastinal silhouette. Normal pulmonary vascularity. Trace left pleural effusion. No consolidation or pneumothorax. No acute osseous abnormality. IMPRESSION: 1. Trace left pleural effusion. Electronically Signed   By: Titus Dubin M.D.   On: 08/04/2021 11:34   DG Lumbar Spine Complete  Result Date: 08/04/2021 CLINICAL DATA:  Fall . EXAM: LUMBAR SPINE - COMPLETE 4+ VIEW COMPARISON:  None Available. FINDINGS: No evidence for an acute  fracture. There is diffuse loss of intervertebral disc height in the lumbar spine with diffuse endplate degeneration. Trace anterolisthesis of L3 on 4 evident. IMPRESSION: Negative. Electronically Signed   By: Misty Stanley M.D.   On: 08/04/2021 11:30    EKG: Independently reviewed.  Sinus rhythm with first-degree AV block.  No significant change since prior  tracing.  Assessment and Plan  Severe lumbar spinal stenosis Patient presenting with complaints of chronic low back pain and acute onset bilateral lower extremity weakness/numbness.  No lower extremity weakness or gross sensory deficit appreciated on exam done at this time.  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  Stable to slight progression of severe spinal stenosis and moderate neural foraminal stenosis at L2-3.  Also showing slight progression of moderate spinal stenosis at L3-4. -Neurosurgery consulted -PT/OT eval, fall precautions  SIRS Meets 2 SIRS criteria with temperature 102.4 F and WBC 3.0.  No tachycardia, hypotension, or lactic acidosis.  Source of infection unknown at this time.  UA not suggestive of infection.  Chest x-ray not suggestive of pneumonia.  No meningeal signs.  MRI of lumbar spine without spinal abscess.  SARS-CoV-2 PCR negative.  He had an episode of vomiting in the ED per RN note but no significant elevation of LFTs on labs and abdominal exam benign.  ?Viral gastroenteritis although he denies having any GI symptoms at home. -Broad-spectrum antibiotics at this time and check procalcitonin level -Tylenol as needed for fevers -Urine and blood cultures pending  Acute thrombocytopenia Possibly due to underlying infection given fever and leukopenia.  No signs of active bleeding. -Continue to monitor  AKI Creatinine 1.7, baseline 1.2-1.4. ?Pre-renal from dehydration. -Patient received 2 L IV fluid boluses in the ED, continue gentle IV fluid hydration. -Monitor renal function -Avoid nephrotoxic agents/hold home torsemide and valsartan.  Chronic HFpEF Stable, no signs of volume overload. -Hold torsemide and valsartan given AKI -Monitor volume status closely  A-fib Stable, currently in sinus rhythm. -Hold Eliquis until patient is seen by neurosurgery -Continue carvedilol and flecainide  Hypertension Stable. -Continue  carvedilol -Hold torsemide and valsartan given AKI  Hyperlipidemia -Continue Lipitor  Diet controlled type 2 diabetes Peripheral neuropathy A1c 5.8 on 05/11/2021. -Continue gabapentin  Gout -Continue allopurinol  Chronic anemia Hemoglobin stable. -Continue to monitor  Asthma Stable, no signs of acute exacerbation. -Continue Breo, albuterol as needed, Singulair  DVT prophylaxis: SCDs Code Status: Full Code (discussed with the patient) Family Communication: No family available at this time. Consults called: Neurosurgery Level of care: Telemetry bed Admission status: It is my clinical opinion that referral for OBSERVATION is reasonable and necessary in this patient based on the above information provided. The aforementioned taken together are felt to place the patient at high risk for further clinical deterioration. However, it is anticipated that the patient may be medically stable for discharge from the hospital within 24 to 48 hours.   Shela Leff MD [facility]  If 7PM-7AM, please contact night-coverage [email or web address]  08/04/2021, 11:00 PM

## 2021-08-04 NOTE — ED Notes (Signed)
Patient transported to MRI 

## 2021-08-04 NOTE — ED Notes (Signed)
Pt vomited bilious emesis in bed. RN assisted pt in changing gown and linens. Provided emesis bag and notified provider.   Called MRI for updated ETA; per MRI tech, pt is next on the list and should be picked up within the net 30-40 mins.

## 2021-08-04 NOTE — ED Provider Notes (Signed)
Patient signed to me by Dr. Gilford Raid pending results of MRI of lumbar spine.  Results that reviewed and interpreted and has worsening spinal stenosis.  Case discussed with neurosurgery who will see patient in consultation.  Recommends medicine admission.  Labs are significant for pancytopenia of unknown etiology.  Patient was febrile here.  No evidence of abscess on patient's MRI.  Does not have any neck pain or nuchal rigidity.  No suspicion for meningitis.  Urinalysis clean here.  No respiratory symptoms.  Chest x-ray per my interpretation shows no infiltrate.  Plan will be to admit   Lacretia Leigh, MD 08/04/21 2139

## 2021-08-04 NOTE — ED Notes (Signed)
Pt stated after he drinks some more he amy be able to urinate

## 2021-08-04 NOTE — ED Notes (Signed)
26m of urine. Urinal halfway full before bladder scan.

## 2021-08-05 ENCOUNTER — Encounter (HOSPITAL_COMMUNITY): Payer: Self-pay | Admitting: Internal Medicine

## 2021-08-05 DIAGNOSIS — R6510 Systemic inflammatory response syndrome (SIRS) of non-infectious origin without acute organ dysfunction: Secondary | ICD-10-CM | POA: Diagnosis not present

## 2021-08-05 DIAGNOSIS — Z7901 Long term (current) use of anticoagulants: Secondary | ICD-10-CM | POA: Diagnosis not present

## 2021-08-05 DIAGNOSIS — D72819 Decreased white blood cell count, unspecified: Secondary | ICD-10-CM | POA: Diagnosis not present

## 2021-08-05 DIAGNOSIS — I5032 Chronic diastolic (congestive) heart failure: Secondary | ICD-10-CM

## 2021-08-05 DIAGNOSIS — W1830XA Fall on same level, unspecified, initial encounter: Secondary | ICD-10-CM | POA: Diagnosis present

## 2021-08-05 DIAGNOSIS — Z8546 Personal history of malignant neoplasm of prostate: Secondary | ICD-10-CM | POA: Diagnosis not present

## 2021-08-05 DIAGNOSIS — R29898 Other symptoms and signs involving the musculoskeletal system: Secondary | ICD-10-CM

## 2021-08-05 DIAGNOSIS — I429 Cardiomyopathy, unspecified: Secondary | ICD-10-CM | POA: Diagnosis present

## 2021-08-05 DIAGNOSIS — D61818 Other pancytopenia: Principal | ICD-10-CM

## 2021-08-05 DIAGNOSIS — N179 Acute kidney failure, unspecified: Secondary | ICD-10-CM | POA: Diagnosis present

## 2021-08-05 DIAGNOSIS — R17 Unspecified jaundice: Secondary | ICD-10-CM | POA: Diagnosis not present

## 2021-08-05 DIAGNOSIS — I480 Paroxysmal atrial fibrillation: Secondary | ICD-10-CM

## 2021-08-05 DIAGNOSIS — D696 Thrombocytopenia, unspecified: Secondary | ICD-10-CM | POA: Diagnosis not present

## 2021-08-05 DIAGNOSIS — J4530 Mild persistent asthma, uncomplicated: Secondary | ICD-10-CM | POA: Diagnosis not present

## 2021-08-05 DIAGNOSIS — C61 Malignant neoplasm of prostate: Secondary | ICD-10-CM

## 2021-08-05 DIAGNOSIS — M48062 Spinal stenosis, lumbar region with neurogenic claudication: Secondary | ICD-10-CM | POA: Diagnosis not present

## 2021-08-05 DIAGNOSIS — R509 Fever, unspecified: Secondary | ICD-10-CM | POA: Diagnosis not present

## 2021-08-05 DIAGNOSIS — Z85828 Personal history of other malignant neoplasm of skin: Secondary | ICD-10-CM | POA: Diagnosis not present

## 2021-08-05 DIAGNOSIS — E876 Hypokalemia: Secondary | ICD-10-CM | POA: Diagnosis not present

## 2021-08-05 DIAGNOSIS — I130 Hypertensive heart and chronic kidney disease with heart failure and stage 1 through stage 4 chronic kidney disease, or unspecified chronic kidney disease: Secondary | ICD-10-CM | POA: Diagnosis present

## 2021-08-05 DIAGNOSIS — I10 Essential (primary) hypertension: Secondary | ICD-10-CM

## 2021-08-05 DIAGNOSIS — E1122 Type 2 diabetes mellitus with diabetic chronic kidney disease: Secondary | ICD-10-CM | POA: Diagnosis present

## 2021-08-05 DIAGNOSIS — Z20822 Contact with and (suspected) exposure to covid-19: Secondary | ICD-10-CM | POA: Diagnosis present

## 2021-08-05 DIAGNOSIS — E1142 Type 2 diabetes mellitus with diabetic polyneuropathy: Secondary | ICD-10-CM | POA: Diagnosis present

## 2021-08-05 DIAGNOSIS — E871 Hypo-osmolality and hyponatremia: Secondary | ICD-10-CM | POA: Diagnosis not present

## 2021-08-05 DIAGNOSIS — J449 Chronic obstructive pulmonary disease, unspecified: Secondary | ICD-10-CM | POA: Diagnosis present

## 2021-08-05 DIAGNOSIS — R202 Paresthesia of skin: Secondary | ICD-10-CM

## 2021-08-05 DIAGNOSIS — N1832 Chronic kidney disease, stage 3b: Secondary | ICD-10-CM

## 2021-08-05 DIAGNOSIS — G8929 Other chronic pain: Secondary | ICD-10-CM | POA: Diagnosis present

## 2021-08-05 DIAGNOSIS — M4800 Spinal stenosis, site unspecified: Secondary | ICD-10-CM | POA: Diagnosis present

## 2021-08-05 DIAGNOSIS — E7800 Pure hypercholesterolemia, unspecified: Secondary | ICD-10-CM | POA: Diagnosis present

## 2021-08-05 DIAGNOSIS — W19XXXA Unspecified fall, initial encounter: Secondary | ICD-10-CM

## 2021-08-05 DIAGNOSIS — E538 Deficiency of other specified B group vitamins: Secondary | ICD-10-CM | POA: Diagnosis present

## 2021-08-05 DIAGNOSIS — Y92009 Unspecified place in unspecified non-institutional (private) residence as the place of occurrence of the external cause: Secondary | ICD-10-CM

## 2021-08-05 DIAGNOSIS — E559 Vitamin D deficiency, unspecified: Secondary | ICD-10-CM | POA: Diagnosis present

## 2021-08-05 DIAGNOSIS — M48061 Spinal stenosis, lumbar region without neurogenic claudication: Secondary | ICD-10-CM | POA: Diagnosis present

## 2021-08-05 DIAGNOSIS — E1165 Type 2 diabetes mellitus with hyperglycemia: Secondary | ICD-10-CM | POA: Diagnosis present

## 2021-08-05 DIAGNOSIS — Z9221 Personal history of antineoplastic chemotherapy: Secondary | ICD-10-CM | POA: Diagnosis not present

## 2021-08-05 DIAGNOSIS — R739 Hyperglycemia, unspecified: Secondary | ICD-10-CM

## 2021-08-05 LAB — BASIC METABOLIC PANEL
Anion gap: 8 (ref 5–15)
BUN: 21 mg/dL (ref 8–23)
CO2: 20 mmol/L — ABNORMAL LOW (ref 22–32)
Calcium: 7.7 mg/dL — ABNORMAL LOW (ref 8.9–10.3)
Chloride: 107 mmol/L (ref 98–111)
Creatinine, Ser: 1.64 mg/dL — ABNORMAL HIGH (ref 0.61–1.24)
GFR, Estimated: 43 mL/min — ABNORMAL LOW (ref >60)
Glucose, Bld: 140 mg/dL — ABNORMAL HIGH (ref 70–99)
Potassium: 3.6 mmol/L (ref 3.5–5.1)
Sodium: 135 mmol/L (ref 135–145)

## 2021-08-05 LAB — CBC
HCT: 26.9 % — ABNORMAL LOW (ref 39.0–52.0)
Hemoglobin: 8.8 g/dL — ABNORMAL LOW (ref 13.0–17.0)
MCH: 29.9 pg (ref 26.0–34.0)
MCHC: 32.7 g/dL (ref 30.0–36.0)
MCV: 91.5 fL (ref 80.0–100.0)
Platelets: 84 10*3/uL — ABNORMAL LOW (ref 150–400)
RBC: 2.94 MIL/uL — ABNORMAL LOW (ref 4.22–5.81)
RDW: 15.4 % (ref 11.5–15.5)
WBC: 3 10*3/uL — ABNORMAL LOW (ref 4.0–10.5)
nRBC: 0 % (ref 0.0–0.2)

## 2021-08-05 LAB — GLUCOSE, CAPILLARY: Glucose-Capillary: 134 mg/dL — ABNORMAL HIGH (ref 70–99)

## 2021-08-05 LAB — PROCALCITONIN: Procalcitonin: 0.94 ng/mL

## 2021-08-05 LAB — HEMOGLOBIN A1C
Hgb A1c MFr Bld: 6.9 % — ABNORMAL HIGH (ref 4.8–5.6)
Mean Plasma Glucose: 151.33 mg/dL

## 2021-08-05 MED ORDER — CYCLOSPORINE 0.05 % OP EMUL
1.0000 [drp] | Freq: Two times a day (BID) | OPHTHALMIC | Status: DC
  Administered 2021-08-06 – 2021-08-08 (×4): 1 [drp] via OPHTHALMIC
  Filled 2021-08-05 (×7): qty 30

## 2021-08-05 MED ORDER — VANCOMYCIN HCL IN DEXTROSE 1-5 GM/200ML-% IV SOLN
1000.0000 mg | INTRAVENOUS | Status: DC
  Administered 2021-08-06: 1000 mg via INTRAVENOUS
  Filled 2021-08-05: qty 200

## 2021-08-05 MED ORDER — TORSEMIDE 20 MG PO TABS
20.0000 mg | ORAL_TABLET | Freq: Every day | ORAL | Status: DC
  Administered 2021-08-05: 20 mg via ORAL
  Filled 2021-08-05: qty 1

## 2021-08-05 MED ORDER — MELATONIN 3 MG PO TABS
3.0000 mg | ORAL_TABLET | Freq: Every evening | ORAL | Status: DC | PRN
Start: 2021-08-05 — End: 2021-08-08
  Administered 2021-08-05 – 2021-08-07 (×2): 3 mg via ORAL
  Filled 2021-08-05 (×2): qty 1

## 2021-08-05 MED ORDER — SODIUM CHLORIDE 0.9 % IV SOLN
2.0000 g | Freq: Two times a day (BID) | INTRAVENOUS | Status: DC
  Administered 2021-08-05 – 2021-08-06 (×4): 2 g via INTRAVENOUS
  Filled 2021-08-05 (×4): qty 12.5

## 2021-08-05 MED ORDER — INSULIN ASPART 100 UNIT/ML IJ SOLN
0.0000 [IU] | Freq: Three times a day (TID) | INTRAMUSCULAR | Status: DC
  Administered 2021-08-06: 1 [IU] via SUBCUTANEOUS
  Administered 2021-08-06: 2 [IU] via SUBCUTANEOUS

## 2021-08-05 MED ORDER — VANCOMYCIN HCL 1500 MG/300ML IV SOLN
1500.0000 mg | Freq: Once | INTRAVENOUS | Status: AC
  Administered 2021-08-05: 1500 mg via INTRAVENOUS
  Filled 2021-08-05: qty 300

## 2021-08-05 MED ORDER — APIXABAN 5 MG PO TABS
5.0000 mg | ORAL_TABLET | Freq: Two times a day (BID) | ORAL | Status: DC
  Administered 2021-08-05 – 2021-08-08 (×7): 5 mg via ORAL
  Filled 2021-08-05 (×7): qty 1

## 2021-08-05 NOTE — Progress Notes (Signed)
PROGRESS NOTE  [name] GQQ:[id number] DOB: 10/07/1940   PCP: Isaac Bliss, [name], MD  Patient is from: Home.  Lives alone.  Ambulates independently at baseline.  DOA: 08/04/2021 LOS: 0  Chief complaints Chief Complaint  Patient presents with   Fall     Brief Narrative / Interim history: 81 year old M with PMH of HFpEF, A-fib on Eliquis, DM-2, HTN, HLD, prostate cancer, gout, chronic back pain with radiculopathy presenting with bilateral lower extremity weakness and numbness below his knees after accidental fall at home.  Reportedly, he fell while trying to put on his pants but did not hit his head or LOC.  No bowel or bladder habit change.  In ED, febrile to 102.4.  WBC 3.0.  Platelets 95 (acute).  UA and CXR without significant finding.  MRI lumbar spine showed unchanged markedly severe spinal stenosis at L4-5 and stable to slight progression of severe spinal stenosis and moderate neuroforaminal stenosis at L2-3.  Neurosurgery consulted.  Cultures drawn.  Patient was started on IV fluid and broad-spectrum antibiotics, and admitted.  The next day, evaluated by neurosurgery, and decided to try epidural steroid injection outpatient versus surgery.  Blood cultures NGTD.  Urine culture pending.  Remains on broad-spectrum antibiotics.    Subjective: Seen and examined earlier this morning.  No major events overnight of this morning.  Still with some numbness and weakness in bilateral lower extremity but improved.  He denies chest pain, dyspnea, GI or UTI symptoms.  He feels cold.  Seems to be shivering a little bit.  Objective: Vitals:   08/05/21 0317 08/05/21 0644 08/05/21 0809 08/05/21 1220  BP: 118/62 (!) 101/50 118/60 (!) 130/55  Pulse: 64 (!) 57 61 73  Resp: '19 17 16 '$ (!) 21  Temp: (!) 100.8 F (38.2 C)  (!) 97.5 F (36.4 C) 98.9 F (37.2 C)  TempSrc: Oral  Oral Oral  SpO2: 93% 91% 97% 94%  Weight: 85.7 kg     Height: '5\' 10"'$  (1.778 m)        Examination:  GENERAL: Seems to be feeling cold and shivering a little bit. HEENT: MMM.  Vision and hearing grossly intact.  NECK: Supple.  No apparent JVD.  RESP:  No IWOB.  Fair aeration bilaterally. CVS:  RRR. Heart sounds normal.  ABD/GI/GU: BS+. Abd soft, NTND.  MSK/EXT:  Moves extremities. No apparent deformity. No edema.  SKIN: no apparent skin lesion or wound NEURO: Awake, alert and oriented appropriately.  Fair strength in BLE.  No asymmetry.  Seems to have some weakness with ankle dorsiflexion bilaterally.  Patellar reflex symmetric.  Light sensation seems intact. PSYCH: Calm. Normal affect.   Procedures:  None Microbiology summarized: COVID-19 PCR nonreactive. Blood cultures NGTD Urine culture pending  Assessment and plan: Principal Problem:   Spinal stenosis Active Problems:   SIRS (systemic inflammatory response syndrome) (HCC)   Pancytopenia (HCC)   Paroxysmal A-fib (HCC)   Chronic diastolic (congestive) heart failure (HCC)   Neoplasm of prostate, malignant (HCC)   NIDDM-2 with polyneuropathy and hyperglycemia   Asthma   Essential hypertension   Chronic kidney disease, stage 3b (HCC)   Thrombocytopenia ([place])   Hyperglycemia   Fall at home, initial encounter   Paresthesia of lower extremity   Lower extremity weakness  Accidental fall at home-no prodromes.  Denies hitting his head or loss of consciousness. Lumbar spinal stenosis/bilateral LE weakness and paresthesia -MRI lumbar spine finding as above.  No bowel or bladder habit change.  He has  fever but low suspicion for infection in this area. -Evaluated by neurosurgery-plan for epidural steroid injection outpatient versus surgery. -PT/OT eval  SIRS: Has fever and leukopenia on presentation.  No clear source of infection.  No respiratory, GI or UTI symptoms.  He feels cold and has slight shivers this morning.  He has history of prostate cancer and TKA.  Blood cultures NGTD.  Urine culture  pending. -Continue broad-spectrum antibiotics for another 24 hours -Follow cultures.  Pancytopenia: He has anemia with baseline Hgb of about 10.  Leukopenia and thrombocytopenia seems acute.  Due to acute infection? -Continue monitoring -Check anemia panel.  CKD-3B: Renal function seems to be stable. Recent Labs    11/28/20 1456 12/14/20 1101 12/30/20 1613 12/31/20 0429 01/01/21 0650 01/06/21 0950 06/23/21 1351 08/04/21 1030 08/05/21 0437  BUN 26 19 25* '20 10 16 '$ 39* 22 21  CREATININE 1.50* 1.44 2.60* 1.98* 1.29* 1.24 1.61* 1.70* 1.64*  -Continue monitoring -Minimize or avoid nephrotoxic meds  History of DM-2 with hyperglycemia and polyneuropathy: Last A1c 5.8% on 3/16.  Does not seem to be on medication. -CBG monitoring -SSI -Recheck hemoglobin Y1E  Chronic diastolic CHF/cardiomyopathy: TTE in 2020 with LVEF of 50 to 55% and no other significant finding.  Seems to be on torsemide, Coreg and Diovan at home. -Discontinue IV fluid -Resume home torsemide.  Continue home Coreg.  Continue holding home Diovan. -Monitor intake and output, renal functions and electrolytes  Paroxysmal A-fib: Seems to be in sinus rhythm with first-degree AVB.  Followed by electrophysiology.  On Coreg, flecainide and Eliquis at home. -Continue home medication and telemetry monitoring -Optimize electrolytes  Chronic COPD/asthma: Stable.  No respiratory symptoms. -Continue home Trelegy Ellipta with as needed nebs.  History of prostate cancer: No urinary symptoms. -Outpatient follow-up  History of gout: Stable. -Continue home allopurinol.   Body mass index is 27.11 kg/m.         DVT prophylaxis:  SCDs Start: 08/04/21 2344 apixaban (ELIQUIS) tablet 5 mg  Code Status: Full code Family Communication: Patient declined. Level of care: Telemetry Medical Status is: Observation The patient will require care spanning > 2 midnights and should be moved to inpatient because: SIRS, pancytopenia  requiring broad-spectrum IV antibiotics pending further evaluation for infectious source   Final disposition: Home with home health. Consultants:  Neurosurgery  Sch Meds:  Scheduled Meds:  allopurinol  300 mg Oral Daily   apixaban  5 mg Oral BID   atorvastatin  20 mg Oral Daily   carvedilol  12.5 mg Oral BID   cycloSPORINE  1 drop Both Eyes BID   flecainide  100 mg Oral BID   fluticasone furoate-vilanterol  1 puff Inhalation Daily   gabapentin  600 mg Oral QHS   insulin aspart  0-6 Units Subcutaneous TID WC   montelukast  10 mg Oral QHS   torsemide  20 mg Oral Daily   Continuous Infusions:  ceFEPime (MAXIPIME) IV 2 g (08/05/21 0904)   metronidazole Stopped (08/05/21 0303)   [START ON 08/06/2021] vancomycin     PRN Meds:.acetaminophen **OR** acetaminophen, albuterol  Antimicrobials: Anti-infectives (From admission, onward)    Start     Dose/Rate Route Frequency Ordered Stop   08/06/21 0400  vancomycin (VANCOCIN) IVPB 1000 mg/200 mL premix        1,000 mg 200 mL/hr over 60 Minutes Intravenous Every 24 hours 08/05/21 1038     08/05/21 0030  ceFEPIme (MAXIPIME) 2 g in sodium chloride 0.9 % 100 mL IVPB  2 g 200 mL/hr over 30 Minutes Intravenous 2 times daily 08/05/21 0029     08/05/21 0030  vancomycin (VANCOREADY) IVPB 1500 mg/300 mL        1,500 mg 150 mL/hr over 120 Minutes Intravenous  Once 08/05/21 0029 08/05/21 0557   08/05/21 0000  metroNIDAZOLE (FLAGYL) IVPB 500 mg        500 mg 100 mL/hr over 60 Minutes Intravenous Every 12 hours 08/04/21 2347          I have personally reviewed the following labs and images: CBC: Recent Labs  Lab 08/04/21 1030 08/05/21 0437  WBC 3.0* 3.0*  NEUTROABS 2.3  --   HGB 10.6* 8.8*  HCT 32.8* 26.9*  MCV 93.7 91.5  PLT 95* 84*   BMP &GFR Recent Labs  Lab 08/04/21 1030 08/05/21 0437  NA 134* 135  K 3.6 3.6  CL 104 107  CO2 25 20*  GLUCOSE 180* 140*  BUN 22 21  CREATININE 1.70* 1.64*  CALCIUM 8.1* 7.7*    Estimated Creatinine Clearance: 38.3 mL/min (A) (by C-G formula based on SCr of 1.64 mg/dL (H)). Liver & Pancreas: Recent Labs  Lab 08/04/21 1030  AST 45*  ALT 24  ALKPHOS 85  BILITOT 1.0  PROT 6.1*  ALBUMIN 3.1*   No results for input(s): "LIPASE", "AMYLASE" in the last 168 hours. No results for input(s): "AMMONIA" in the last 168 hours. Diabetic: No results for input(s): "HGBA1C" in the last 72 hours. No results for input(s): "GLUCAP" in the last 168 hours. Cardiac Enzymes: No results for input(s): "CKTOTAL", "CKMB", "CKMBINDEX", "TROPONINI" in the last 168 hours. No results for input(s): "PROBNP" in the last 8760 hours. Coagulation Profile: Recent Labs  Lab 08/04/21 1030  INR 1.7*   Thyroid Function Tests: No results for input(s): "TSH", "T4TOTAL", "FREET4", "T3FREE", "THYROIDAB" in the last 72 hours. Lipid Profile: No results for input(s): "CHOL", "HDL", "LDLCALC", "TRIG", "CHOLHDL", "LDLDIRECT" in the last 72 hours. Anemia Panel: No results for input(s): "VITAMINB12", "FOLATE", "FERRITIN", "TIBC", "IRON", "RETICCTPCT" in the last 72 hours. Urine analysis:    Component Value Date/Time   COLORURINE YELLOW 08/04/2021 1611   APPEARANCEUR CLEAR 08/04/2021 1611   LABSPEC 1.014 08/04/2021 1611   PHURINE 5.0 08/04/2021 1611   GLUCOSEU NEGATIVE 08/04/2021 1611   HGBUR NEGATIVE 08/04/2021 1611   BILIRUBINUR NEGATIVE 08/04/2021 1611   KETONESUR NEGATIVE 08/04/2021 1611   PROTEINUR 30 (A) 08/04/2021 1611   NITRITE NEGATIVE 08/04/2021 1611   LEUKOCYTESUR NEGATIVE 08/04/2021 1611   Sepsis Labs: Invalid input(s): "PROCALCITONIN", "LACTICIDVEN"  Microbiology: Recent Results (from the past 240 hour(s))  SARS Coronavirus 2 by RT PCR (hospital order, performed in [facility] hospital lab) *cepheid single result test* Anterior Nasal Swab     Status: None   Collection Time: 08/04/21 10:10 AM   Specimen: Anterior Nasal Swab  Result Value Ref Range Status   SARS Coronavirus  2 by RT PCR NEGATIVE NEGATIVE Final    Comment: (NOTE) SARS-CoV-2 target nucleic acids are NOT DETECTED.  The SARS-CoV-2 RNA is generally detectable in upper and lower respiratory specimens during the acute phase of infection. The lowest concentration of SARS-CoV-2 viral copies this assay can detect is 250 copies / mL. A negative result does not preclude SARS-CoV-2 infection and should not be used as the sole basis for treatment or other patient management decisions.  A negative result may occur with improper specimen collection / handling, submission of specimen other than nasopharyngeal swab, presence of viral mutation(s) within the  areas targeted by this assay, and inadequate number of viral copies (<250 copies / mL). A negative result must be combined with clinical observations, patient history, and epidemiological information.  Fact Sheet for Patients:   https://www.patel.info/  Fact Sheet for Healthcare Providers: https://hall.com/  This test is not yet approved or  cleared by the [place] FDA and has been authorized for detection and/or diagnosis of SARS-CoV-2 by FDA under an Emergency Use Authorization (EUA).  This EUA will remain in effect (meaning this test can be used) for the duration of the COVID-19 declaration under Section 564(b)(1) of the Act, 21 U.S.C. section 360bbb-3(b)(1), unless the authorization is terminated or revoked sooner.  Performed at [facility], [place] [address] [place], [place] [address]   Blood Culture (routine x 2)     Status: None (Preliminary result)   Collection Time: 08/04/21 10:30 AM   Specimen: BLOOD  Result Value Ref Range Status   Specimen Description BLOOD BLOOD LEFT HAND  Final   Special Requests   Final    BOTTLES DRAWN AEROBIC AND ANAEROBIC Blood Culture results may not be optimal due to an inadequate volume of blood received in culture bottles   Culture   Final    NO GROWTH <  24 HOURS Performed at [facility], [place] [address] [place], [place] [address]    Report Status PENDING  Incomplete  Blood Culture (routine x 2)     Status: None (Preliminary result)   Collection Time: 08/04/21 10:40 AM   Specimen: BLOOD  Result Value Ref Range Status   Specimen Description BLOOD RIGHT ANTECUBITAL  Final   Special Requests   Final    BOTTLES DRAWN AEROBIC AND ANAEROBIC Blood Culture adequate volume   Culture   Final    NO GROWTH < 24 HOURS Performed at [facility], [place] [address], [place], [place] [address]    Report Status PENDING  Incomplete    Radiology Studies: MR LUMBAR SPINE WO CONTRAST  Result Date: 08/04/2021 CLINICAL DATA:  Lumbar spinal stenosis. Fall. Bilateral lower extremity pain and weakness. EXAM: MRI LUMBAR SPINE WITHOUT CONTRAST TECHNIQUE: Multiplanar, multisequence MR imaging of the lumbar spine was performed. No intravenous contrast was administered. 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 Alignment: Unchanged grade 1 retrolisthesis of L2 on L3 and L5 on S1 and grade 1 anterolisthesis of L4 on L5. Vertebrae: No fracture or suspicious marrow lesion. Mild degenerative endplate edema at H6-0 and L2-3. Conus medullaris and cauda equina: Conus extends to the L1-2 level and is normal in signal. Redundancy of the cauda equina related to high-grade spinal stenosis. Paraspinal and other soft tissues: Unremarkable. Disc levels: Disc desiccation and disc space narrowing throughout the lumbar spine with disc narrowing being most severe at L1-2, L2-3, and L5-S1. T12-L1: Negative. L1-2: Disc bulging, endplate spurring, and moderate facet hypertrophy result in borderline to mild bilateral neural foraminal stenosis without spinal stenosis, unchanged. L2-3: Circumferential disc bulging, prominent dorsal epidural fat, and moderate to severe facet and ligamentum flavum  hypertrophy result in severe spinal stenosis and moderate bilateral neural foraminal stenosis, stable to slightly progressed. L3-4: Circumferential disc bulging, prominent dorsal epidural fat, and moderate to severe facet and ligamentum flavum hypertrophy result in moderate spinal stenosis and mild-to-moderate bilateral neural foraminal stenosis, slightly progressed. Bilateral facet joint effusions. L4-5: Anterolisthesis with bulging uncovered disc and severe facet and ligamentum flavum hypertrophy result in severe spinal stenosis (3  mm AP spinal canal diameter) and mild bilateral neural foraminal stenosis, unchanged. Moderate bilateral facet joint effusions. L5-S1: Disc bulging and endplate spurring eccentric to the right and moderate facet hypertrophy result in moderate right neural foraminal stenosis without spinal stenosis, unchanged. S1-2: Rudimentary disc.  No stenosis. IMPRESSION: 1. 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 2. Stable to slight progression of severe spinal stenosis and moderate neural foraminal stenosis at L2-3. 3. Slight progression of moderate spinal stenosis at L3-4. Electronically Signed   By: Logan Bores M.D.   On: 08/04/2021 20:22      Iniko Robles T. [place]  If 7PM-7AM, please contact night-coverage [email or web address] 08/05/2021, 12:24 PM

## 2021-08-05 NOTE — Consult Note (Signed)
Reason for Consult: Spinal stenosis Referring Physician: Medicine  [name] is an 81 y.o. male.  HPI: 81 year old male with multiple medical issues including diabetes, heart failure, cardiomyopathy and B12 deficiency.  Patient presents with increased back pain and secondary to falling.  The patient has known lumbar stenosis.  The patient has significant back pain with standing or ambulating.  He has some radiating pain into his lower extremities.  He has no bowel or bladder dysfunction but does have some numbness and tingling distally in both lower extremities.  Patient's symptoms are worsened with standing or walking.  He is reasonably comfortable at rest.  Complicating the patient's situation is he is recently become febrile.  He has some degree of leukopenia and thrombocytopenia.  Source of fever and pancytopenia unclear.  Past Medical History:  Diagnosis Date   Acute tracheobronchitis 01/05/2019   B12 deficiency    Basal cell carcinoma (BCC) of left side of nose 01/28/2018   Cancer ([place])    Cardiomyopathy ([place]) 03/10/2018   With chronic atrial fibrillation   CHF (congestive heart failure) ([place]) 04/16/2013   Functional class II, ejection fraction 35-40%  Formatting of this note might be different from the original. Functional class II, ejection fraction 35-40%   Chronic anticoagulation    Chronic diastolic (congestive) heart failure ([place]) 04/16/2013   Functional class II, ejection fraction 35-40%  Last Assessment & Plan:  Clinically stable Volume well controlled meds reviewed   Chronic diastolic CHF (congestive heart failure) ([place]) 04/16/2013   Functional class II, ejection fraction 35-40%  Last Assessment & Plan:  Clinically stable Volume well controlled meds reviewed   Colon polyps 04/16/2013   Diabetic neuropathy (HCC)    Eosinophil count raised 02/17/2019   Essential hypertension 01/06/2010   Last Assessment & Plan:  Well controlled Continue med management   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  Steroid usage was discontinued several months ago.  Follow up with Dermatology as planned.   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   Last Assessment & Plan:  Id following ?ongoing abx managementy reported Request records   Infection of prosthetic right knee joint ([place])    Insomnia 03/04/2012   Grief with loss of wife 02/20/12  Last Assessment & Plan:  Improved sx  contineu xanax prn Se discussed   Lesion of liver 04/20/2019   -On cardiac CT 02/2019   Mild reactive airways disease    Neoplasm of prostate 04/16/2013   Neoplasm of prostate, malignant ([place]) 01/06/2010   [name] S/p radiation therapy   Last Assessment & Plan:  Repeat psa today No urinary sx Pt reported fatigue/poor appetite   On amiodarone therapy 09/07/2013   On continuous oral anticoagulation 01/28/2018   Other activity(E029.9) 04/20/2013   Formatting of this note might be different from the original. Transthoracic Echocardiogram-03/10/[facility]: Normal left ventricular wall thickness and cavity size.  Global left ventricular systolic function is moderately reduced.  The estimated ejection fraction is 35-40%.  The left atrium is moderately enlarged.  The right atrium is mildly enlarged.  No significant valvular    Overweight (BMI 25.0-29.9) 10/22/2016  Persistent atrial fibrillation ([place]) 04/16/2013   Last Assessment & Plan:  Rate controlled Continue med management eliquis for stroke preventino  Formatting of this note might be different from the original.  Drug  HX Current Rx Pre-ABL inefficacy Pre-ABL intolerant  Post-ABL inefficacy Post-ABL intolerant max dose/24h M/Y end comments  sotalol                  dofetilide                  flecainide                  propafenone                  am   S/P TKR (total knee replacement), bilateral    Secondary hypercoagulable state ([employer]) 04/09/2019   Tinea cruris 04/16/2013   Type 2 diabetes mellitus with diabetic polyneuropathy, without long-term current use of insulin ([place]) 04/16/2013   Last Assessment & Plan:  Labs today Pt reports well controlled on ambulatory monitoring   Vitamin D deficiency 07/25/2018    Past Surgical History:  Procedure Laterality Date   ATRIAL FIBRILLATION ABLATION N/A 03/12/2019   Procedure: ATRIAL FIBRILLATION ABLATION;  Surgeon: [name], MD;  Location: [facility];  Service: Cardiovascular;  Laterality: N/A;   ATRIAL FIBRILLATION ABLATION N/A 04/29/2020   Procedure: ATRIAL FIBRILLATION ABLATION;  Surgeon: [name], MD;  Location: [facility] [facility];  Service: Cardiovascular;  Laterality: N/A;   CARDIAC ELECTROPHYSIOLOGY STUDY AND ABLATION     CARDIOVERSION     CARDIOVERSION N/A 10/10/2018   Procedure: CARDIOVERSION;  Surgeon: [name], MD;  Location: [place];  Service: Cardiovascular;  Laterality: N/A;   CARDIOVERSION N/A 05/18/2020   Procedure: CARDIOVERSION;  Surgeon: [name], MD;  Location: [facility] ENDOS[facility];  Service: Cardiovascular;  Laterality: N/A;   CARDIOVERSION N/A 12/09/2020   Procedure: CARDIOVERSION;  Surgeon: Lelon Perla, MD;  Location: [facility] ENDOSCOPY;  Service: Cardiovascular;  Laterality: N/A;   CHOLECYSTECTOMY     PROSTATECTOMY     REPLACEMENT TOTAL KNEE BILATERAL      Family History  Problem Relation Age of Onset   Cancer Mother    Depression Mother    Early death Mother    Cancer Father    Depression Father    Early death Father     Social History:  reports that he has never smoked. He has never used smokeless tobacco. He reports current alcohol use of  about 14.0 standard drinks of alcohol per week. He reports that he does not use drugs.  Allergies: No Known Allergies  Medications: I have reviewed the patient's current medications.  Results for orders placed or performed during the hospital encounter of 08/04/21 (from the past 48 hour(s))  SARS Coronavirus 2 by RT PCR (hospital order, performed in [facility] hospital lab) *cepheid single result test* Anterior Nasal Swab     Status: None   Collection Time: 08/04/21 10:10 AM   Specimen: Anterior Nasal Swab  Result Value Ref Range   SARS Coronavirus 2 by RT PCR NEGATIVE NEGATIVE    Comment: (NOTE) SARS-CoV-2 target nucleic acids are NOT DETECTED.  The SARS-CoV-2 RNA is generally detectable in upper and lower respiratory specimens during the acute phase of infection. The lowest concentration of SARS-CoV-2 viral copies this assay can detect is 250 copies / mL. A negative result does not preclude SARS-CoV-2 infection and should not be used as the sole  basis for treatment or other patient management decisions.  A negative result may occur with improper specimen collection / handling, submission of specimen other than nasopharyngeal swab, presence of viral mutation(s) within the areas targeted by this assay, and inadequate number of viral copies (<250 copies / mL). A negative result must be combined with clinical observations, patient history, and epidemiological information.  Fact Sheet for Patients:   https://www.patel.info/  Fact Sheet for Healthcare Providers: https://hall.com/  This test is not yet approved or  cleared by the [place] FDA and has been authorized for detection and/or diagnosis of SARS-CoV-2 by FDA under an Emergency Use Authorization (EUA).  This EUA will remain in effect (meaning this test can be used) for the duration of the COVID-19 declaration under Section 564(b)(1) of the Act, 21 U.S.C. section 360bbb-3(b)(1),  unless the authorization is terminated or revoked sooner.  Performed at [facility], [place] [address] [place], [place] [address]   Lactic acid, plasma     Status: None   Collection Time: 08/04/21 10:30 AM  Result Value Ref Range   Lactic Acid, Venous 1.8 0.5 - 1.9 mmol/L    Comment: Performed at [facility] [address], [place], [place] [address]  Comprehensive metabolic panel     Status: Abnormal   Collection Time: 08/04/21 10:30 AM  Result Value Ref Range   Sodium 134 (L) 135 - 145 mmol/L   Potassium 3.6 3.5 - 5.1 mmol/L   Chloride 104 98 - 111 mmol/L   CO2 25 22 - 32 mmol/L   Glucose, Bld 180 (H) 70 - 99 mg/dL    Comment: Glucose reference range applies only to samples taken after fasting for at least 8 hours.   BUN 22 8 - 23 mg/dL   Creatinine, Ser 1.70 (H) 0.61 - 1.24 mg/dL   Calcium 8.1 (L) 8.9 - 10.3 mg/dL   Total Protein 6.1 (L) 6.5 - 8.1 g/dL   Albumin 3.1 (L) 3.5 - 5.0 g/dL   AST 45 (H) 15 - 41 U/L   ALT 24 0 - 44 U/L   Alkaline Phosphatase 85 38 - 126 U/L   Total Bilirubin 1.0 0.3 - 1.2 mg/dL   GFR, Estimated 41 (L) >60 mL/min    Comment: (NOTE) Calculated using the CKD-EPI Creatinine Equation (2021)    Anion gap 5 5 - 15    Comment: Performed at [facility], [place] [address] [place], [place] [address]  CBC with Differential     Status: Abnormal   Collection Time: 08/04/21 10:30 AM  Result Value Ref Range   WBC 3.0 (L) 4.0 - 10.5 K/uL   RBC 3.50 (L) 4.22 - 5.81 MIL/uL   Hemoglobin 10.6 (L) 13.0 - 17.0 g/dL   HCT 32.8 (L) 39.0 - 52.0 %   MCV 93.7 80.0 - 100.0 fL   MCH 30.3 26.0 - 34.0 pg   MCHC 32.3 30.0 - 36.0 g/dL   RDW 15.1 11.5 - 15.5 %   Platelets 95 (L) 150 - 400 K/uL    Comment: SPECIMEN CHECKED FOR CLOTS Immature Platelet Fraction may be clinically indicated, consider ordering this additional test [id number] REPEATED TO VERIFY PLATELET COUNT CONFIRMED BY SMEAR    nRBC 0.0 0.0 - 0.2 %   Neutrophils Relative % 78 %   Neutro  Abs 2.3 1.7 - 7.7 K/uL   Lymphocytes Relative 13 %   Lymphs Abs 0.4 (L) 0.7 - 4.0 K/uL   Monocytes Relative 7 %  Monocytes Absolute 0.2 0.1 - 1.0 K/uL   Eosinophils Relative 0 %   Eosinophils Absolute 0.0 0.0 - 0.5 K/uL   Basophils Relative 1 %   Basophils Absolute 0.0 0.0 - 0.1 K/uL   Immature Granulocytes 1 %   Abs Immature Granulocytes 0.03 0.00 - 0.07 K/uL    Comment: Performed at [facility], [place] [address], [place], [place] [address]  Protime-INR     Status: Abnormal   Collection Time: 08/04/21 10:30 AM  Result Value Ref Range   Prothrombin Time 19.4 (H) 11.4 - 15.2 seconds   INR 1.7 (H) 0.8 - 1.2    Comment: (NOTE) INR goal varies based on device and disease states. Performed at [facility], [place] [address], [place], [place] [address]   APTT     Status: None   Collection Time: 08/04/21 10:30 AM  Result Value Ref Range   aPTT 32 24 - 36 seconds    Comment: Performed at [facility] [address] [place], [place] [address]  Urinalysis, Routine w reflex microscopic Urine, Clean Catch     Status: Abnormal   Collection Time: 08/04/21  4:11 PM  Result Value Ref Range   Color, Urine YELLOW YELLOW   APPearance CLEAR CLEAR   Specific Gravity, Urine 1.014 1.005 - 1.030   pH 5.0 5.0 - 8.0   Glucose, UA NEGATIVE NEGATIVE mg/dL   Hgb urine dipstick NEGATIVE NEGATIVE   Bilirubin Urine NEGATIVE NEGATIVE   Ketones, ur NEGATIVE NEGATIVE mg/dL   Protein, ur 30 (A) NEGATIVE mg/dL   Nitrite NEGATIVE NEGATIVE   Leukocytes,Ua NEGATIVE NEGATIVE   RBC / HPF 0-5 0 - 5 RBC/hpf   WBC, UA 0-5 0 - 5 WBC/hpf   Bacteria, UA NONE SEEN NONE SEEN    Comment: Performed at [facility], [place] [address] [place], [place] [address]  Basic metabolic panel     Status: Abnormal   Collection Time: 08/05/21  4:37 AM  Result Value Ref Range   Sodium 135 135 - 145 mmol/L   Potassium 3.6 3.5 - 5.1 mmol/L   Chloride 107 98 - 111 mmol/L   CO2 20 (L) 22 - 32 mmol/L   Glucose,  Bld 140 (H) 70 - 99 mg/dL    Comment: Glucose reference range applies only to samples taken after fasting for at least 8 hours.   BUN 21 8 - 23 mg/dL   Creatinine, Ser 1.64 (H) 0.61 - 1.24 mg/dL   Calcium 7.7 (L) 8.9 - 10.3 mg/dL   GFR, Estimated 43 (L) >60 mL/min    Comment: (NOTE) Calculated using the CKD-EPI Creatinine Equation (2021)    Anion gap 8 5 - 15    Comment: Performed at [facility] [address] [place], [place] [address]  CBC     Status: Abnormal   Collection Time: 08/05/21  4:37 AM  Result Value Ref Range   WBC 3.0 (L) 4.0 - 10.5 K/uL   RBC 2.94 (L) 4.22 - 5.81 MIL/uL   Hemoglobin 8.8 (L) 13.0 - 17.0 g/dL   HCT 26.9 (L) 39.0 - 52.0 %   MCV 91.5 80.0 - 100.0 fL   MCH 29.9 26.0 - 34.0 pg   MCHC 32.7 30.0 - 36.0 g/dL   RDW 15.4 11.5 - 15.5 %   Platelets 84 (L) 150 - 400 K/uL    Comment: Immature Platelet Fraction may be clinically indicated, consider ordering this additional test [id number] CONSISTENT  WITH PREVIOUS RESULT REPEATED TO VERIFY    nRBC 0.0 0.0 - 0.2 %    Comment: Performed at [facility], [place] [address] [place], [place] [address]  Procalcitonin - Baseline     Status: None   Collection Time: 08/05/21  4:37 AM  Result Value Ref Range   Procalcitonin 0.94 ng/mL    Comment:        Interpretation: PCT > 0.5 ng/mL and <= 2 ng/mL: Systemic infection (sepsis) is possible, but other conditions are known to elevate PCT as well. (NOTE)       Sepsis PCT Algorithm           Lower Respiratory Tract                                      Infection PCT Algorithm    ----------------------------     ----------------------------         PCT < 0.25 ng/mL                PCT < 0.10 ng/mL          Strongly encourage             Strongly discourage   discontinuation of antibiotics    initiation of antibiotics    ----------------------------     -----------------------------       PCT 0.25 - 0.50 ng/mL            PCT 0.10 - 0.25 ng/mL                OR       >80% decrease in PCT            Discourage initiation of                                            antibiotics      Encourage discontinuation           of antibiotics    ----------------------------     -----------------------------         PCT >= 0.50 ng/mL              PCT 0.26 - 0.50 ng/mL                AND       <80% decrease in PCT             Encourage initiation of                                             antibiotics       Encourage continuation           of antibiotics    ----------------------------     -----------------------------        PCT >= 0.50 ng/mL                  PCT > 0.50 ng/mL               AND         increase in PCT  Strongly encourage                                      initiation of antibiotics    Strongly encourage escalation           of antibiotics                                     -----------------------------                                           PCT <= 0.25 ng/mL                                                 OR                                        > 80% decrease in PCT                                      Discontinue / Do not initiate                                             antibiotics  Performed at [facility], [place] [address] [place], [place] [address]     MR LUMBAR SPINE WO CONTRAST  Result Date: 08/04/2021 CLINICAL DATA:  Lumbar spinal stenosis. Fall. Bilateral lower extremity pain and weakness. EXAM: MRI LUMBAR SPINE WITHOUT CONTRAST TECHNIQUE: Multiplanar, multisequence MR imaging of the lumbar spine was performed. No intravenous contrast was administered. 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 Alignment: Unchanged grade 1 retrolisthesis of L2 on L3 and L5 on S1 and grade 1 anterolisthesis of L4 on L5. Vertebrae: No fracture or suspicious marrow lesion. Mild degenerative endplate edema at T0-2 and L2-3.  Conus medullaris and cauda equina: Conus extends to the L1-2 level and is normal in signal. Redundancy of the cauda equina related to high-grade spinal stenosis. Paraspinal and other soft tissues: Unremarkable. Disc levels: Disc desiccation and disc space narrowing throughout the lumbar spine with disc narrowing being most severe at L1-2, L2-3, and L5-S1. T12-L1: Negative. L1-2: Disc bulging, endplate spurring, and moderate facet hypertrophy result in borderline to mild bilateral neural foraminal stenosis without spinal stenosis, unchanged. L2-3: Circumferential disc bulging, prominent dorsal epidural fat, and moderate to severe facet and ligamentum flavum hypertrophy result in severe spinal stenosis and moderate bilateral neural foraminal stenosis, stable to slightly progressed. L3-4: Circumferential disc bulging, prominent dorsal epidural fat, and moderate to severe facet and ligamentum flavum hypertrophy result in moderate spinal stenosis and mild-to-moderate bilateral neural foraminal stenosis, slightly progressed. Bilateral facet joint effusions. L4-5: Anterolisthesis with bulging uncovered disc and severe facet and ligamentum flavum hypertrophy result in severe spinal  stenosis (3 mm AP spinal canal diameter) and mild bilateral neural foraminal stenosis, unchanged. Moderate bilateral facet joint effusions. L5-S1: Disc bulging and endplate spurring eccentric to the right and moderate facet hypertrophy result in moderate right neural foraminal stenosis without spinal stenosis, unchanged. S1-2: Rudimentary disc.  No stenosis. IMPRESSION: 1. 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 2. Stable to slight progression of severe spinal stenosis and moderate neural foraminal stenosis at L2-3. 3. Slight progression of moderate spinal stenosis at L3-4. Electronically Signed   By: Logan Bores M.D.   On: 08/04/2021 20:22   DG Chest 1 View  Result Date: 08/04/2021 CLINICAL DATA:  Fall. EXAM: CHEST  1 VIEW  COMPARISON:  Chest x-ray dated December 30, 2020. FINDINGS: Stable cardiomediastinal silhouette. Normal pulmonary vascularity. Trace left pleural effusion. No consolidation or pneumothorax. No acute osseous abnormality. IMPRESSION: 1. Trace left pleural effusion. Electronically Signed   By: Titus Dubin M.D.   On: 08/04/2021 11:34   DG Lumbar Spine Complete  Result Date: 08/04/2021 CLINICAL DATA:  Fall . EXAM: LUMBAR SPINE - COMPLETE 4+ VIEW COMPARISON:  None Available. FINDINGS: No evidence for an acute fracture. There is diffuse loss of intervertebral disc height in the lumbar spine with diffuse endplate degeneration. Trace anterolisthesis of L3 on 4 evident. IMPRESSION: Negative. Electronically Signed   By: Misty Stanley M.D.   On: 08/04/2021 11:30    Pertinent items noted in HPI and remainder of comprehensive ROS otherwise negative. Blood pressure 118/60, pulse 61, temperature (!) 97.5 F (36.4 C), temperature source Oral, resp. rate 16, height '5\' 10"'$  (1.778 m), weight 85.7 kg, SpO2 97 %. Patient is awake and alert.  He is oriented and appropriate.  He is not in any apparent distress currently.  His speech is fluent.  His judgment insight are intact.  Cranial nerve function normal bilateral.  Motor examination 5/5 bilateral.  Sensory examination with some patchy distal sensory loss in both lower extremities.  Reflexes hypoactive but symmetric.  No evidence of long track signs.  Gait not tested.  Examination head ears eyes nose and throat some are unremarkable.  Chest and abdomen are benign.  Extremities free from injury or deformity.  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  No evidence of infectious pathology or acute change however.  I discussed situation with the patient.  We had a discussion with regard to treatment options including the possibility of considering surgical decompression.  The patient does not wish to consider surgery at this point and  is interested in possibly undergoing pain management with epidural steroid injections.  These interventions could be arranged as an outpatient.  I will continue to follow while he is an inpatient.  Mallie Mussel A Paulina Muchmore 08/05/2021, 9:01 AM

## 2021-08-05 NOTE — Progress Notes (Signed)
Updated patient's son over the phone.

## 2021-08-05 NOTE — ED Notes (Signed)
Patient reports improved pain at this time

## 2021-08-05 NOTE — Progress Notes (Signed)
Pharmacy Antibiotic Note  Charles Lloyd. is a 81 y.o. male admitted on 08/04/2021 with sepsis.  Pharmacy has been consulted for vancomycin and cefepime dosing.Temp 102.4 WBC 3.0  Plan: Cefepime 2 G IV q12 hours Vancomycin '1500mg'$  IV x1 then 1G IV Q24 hours (cAUC 471.2)  Height: '5\' 10"'$  (177.8 cm) Weight: 83.7 kg (184 lb 8.4 oz) IBW/kg (Calculated) : 73  Temp (24hrs), Avg:100.6 F (38.1 C), Min:98.8 F (37.1 C), Max:102.4 F (39.1 C)  Recent Labs  Lab 08/04/21 1030  WBC 3.0*  CREATININE 1.70*  LATICACIDVEN 1.8    Estimated Creatinine Clearance: 37 mL/min (A) (by C-G formula based on SCr of 1.7 mg/dL (H)).    No Known Allergies  Antimicrobials this admission: Vancomycin 6/10>> Cefepime 6/10>> Flagyl 6/10>>  Microbiology results: pending  Thank you for allowing pharmacy to be a part of this patient's care.  Nicole Kindred L Wilbert Hayashi 08/05/2021 1:00 AM

## 2021-08-05 NOTE — Progress Notes (Signed)
Patient belongings consist of the following: black wallet, gray tshirt, pair of white socks, blue/green flannel pajama pants. Patient also has cell phone and charging cord at bedside. Patient request for cell phone to remain at bedside and for additional belongings(in patient belonging bag to placed in closet) for son to obtain tomorrow. Patient's son, Dashton Czerwinski, III.  Belongings verified per Lebron Conners, NT.

## 2021-08-05 NOTE — Hospital Course (Signed)
81 year old M with PMH of HFpEF, A-fib on Eliquis, DM-2, HTN, HLD, prostate cancer, gout, chronic back pain with radiculopathy presenting with bilateral lower extremity weakness and numbness below his knees after accidental fall at home.  Reportedly, he fell while trying to put on his pants but did not hit his head or LOC.  No bowel or bladder habit change.  In ED, febrile to 102.4.  WBC 3.0.  Platelets 95 (acute).  UA and CXR without significant finding.  MRI lumbar spine showed unchanged markedly severe spinal stenosis at L4-5 and stable to slight progression of severe spinal stenosis and moderate neuroforaminal stenosis at L2-3.  Neurosurgery consulted.  Cultures drawn.  Patient was started on IV fluid and broad-spectrum antibiotics, and admitted.  The next day, evaluated by neurosurgery, and decided to try epidural steroid injection outpatient versus surgery.  Blood cultures NGTD.  Urine culture pending.  Remains on broad-spectrum antibiotics.

## 2021-08-05 NOTE — Evaluation (Signed)
Occupational Therapy Evaluation Patient Details Name: Charles Lloyd. MRN: [id number] DOB: 10/30/1940 Today's Date: 08/05/2021   History of Present Illness 81 y.o. male presented to the ED 08/04/21 with bilateral lower extremity weakness/numbness after a mechanical fall at home.  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 Neurosurgery consult pending. PMH significant of chronic HFpEF, A-fib on Eliquis, hypertension, hyperlipidemia, type 2 diabetes, bil TKA, history of skin and prostate cancer, gout   Clinical Impression   Prior to this admission, patient living in a 2 story townhome and fully independent with ADLs and IADLs. Patient still drives, and was playing golf on 08/02/21. Currently, patient presenting with weakness, fatigue, decreased activity tolerance, bouts of incontinence and back pain. Patient received after having incontinent episode of stool from recliner all the way to the bathroom, and had a bout of urine incontinence per NT previously in the AM. Patient does not typically have incontinence. Patient is min-mod A for ADLs, with increased difficulty with lower body ADLs in session. OT recommending [place] services at discharge, with assistance from son at home to promote safety. OT will continue to follow acutely.      Recommendations for follow up therapy are one component of a multi-disciplinary discharge planning process, led by the attending physician.  Recommendations may be updated based on patient status, additional functional criteria and insurance authorization.   Follow Up Recommendations  Home health OT    Assistance Recommended at Discharge Intermittent Supervision/Assistance  Patient can return home with the following A little help with walking and/or transfers;A little help with bathing/dressing/bathroom;Assistance with cooking/housework;Assist for transportation;Help with stairs or ramp for entrance    Functional Status Assessment   Patient has had a recent decline in their functional status and demonstrates the ability to make significant improvements in function in a reasonable and predictable amount of time.  Equipment Recommendations  Other (comment) (Will continue to assess)    Recommendations for Other Services       Precautions / Restrictions Precautions Precautions: Fall      Mobility Bed Mobility Overal bed mobility: Modified Independent             General bed mobility comments: HOB elevated    Transfers Overall transfer level: Needs assistance Equipment used: 1 person hand held assist Transfers: Sit to/from Stand Sit to Stand: Min guard           General transfer comment: had just returned from bathroom, completing sit<>stand with one person HHA with min difficulty to due to decreased balance      Balance Overall balance assessment: Needs assistance Sitting-balance support: No upper extremity supported Sitting balance-Leahy Scale: Normal     Standing balance support: No upper extremity supported Standing balance-Leahy Scale: Fair                             ADL either performed or assessed with clinical judgement   ADL Overall ADL's : Needs assistance/impaired Eating/Feeding: Set up;Sitting   Grooming: Set up;Sitting   Upper Body Bathing: Set up;Sitting   Lower Body Bathing: Moderate assistance;Sitting/lateral leans;Sit to/from stand   Upper Body Dressing : Set up;Sitting   Lower Body Dressing: Moderate assistance;Sitting/lateral leans;Sit to/from stand   Toilet Transfer: Minimal assistance;Ambulation;Rolling walker (2 wheels)   Toileting- Clothing Manipulation and Hygiene: Set up;Sitting/lateral lean       Functional mobility during ADLs: Minimal assistance;Cueing for safety;Cueing for sequencing;Rolling walker (2 wheels) General  ADL Comments: Patient presenting with weakness, fatigue, decreased activity tolerance, bouts of incontinence and back pain      Vision Baseline Vision/History: 1 Wears glasses (Readers) Ability to See in Adequate Light: 0 Adequate Patient Visual Report: No change from baseline       Perception     Praxis      Pertinent Vitals/Pain Pain Assessment Pain Assessment: 0-10 Pain Score: 5  Pain Location: back Pain Descriptors / Indicators: Discomfort Pain Intervention(s): Limited activity within patient's tolerance, Monitored during session, Repositioned     Hand Dominance Right   Extremity/Trunk Assessment Upper Extremity Assessment Upper Extremity Assessment: Generalized weakness   Lower Extremity Assessment Lower Extremity Assessment: Defer to PT evaluation   Cervical / Trunk Assessment Cervical / Trunk Assessment: Normal   Communication Communication Communication: No difficulties   Cognition Arousal/Alertness: Awake/alert Behavior During Therapy: Flat affect Overall Cognitive Status: Within Functional Limits for tasks assessed                                       General Comments       Exercises     Shoulder Instructions      Home Living Family/patient expects to be discharged to:: Private residence Living Arrangements: Alone Available Help at Discharge: Family;Available PRN/intermittently (son lives in [place]) Type of Home: House Home Access: Stairs to enter Technical brewer of Steps: 1 Entrance Stairs-Rails: None Home Layout: Two level;Bed/bath upstairs Alternate Level Stairs-Number of Steps: 16 Alternate Level Stairs-Rails: Right Bathroom Shower/Tub: Occupational psychologist: Standard     Home Equipment: None          Prior Functioning/Environment Prior Level of Function : Independent/Modified Independent;Driving             Mobility Comments: plays golf ADLs Comments: drives; son vacuums 1x/week        OT Problem List: Decreased strength;Decreased range of motion;Decreased activity tolerance;Impaired balance (sitting  and/or standing);Decreased coordination;Decreased knowledge of use of DME or AE;Pain      OT Treatment/Interventions: Self-care/ADL training;Therapeutic exercise;Energy conservation;DME and/or AE instruction;Manual therapy;Cognitive remediation/compensation;Therapeutic activities;Balance training;Patient/family education    OT Goals(Current goals can be found in the care plan section) Acute Rehab OT Goals Patient Stated Goal: to feel better OT Goal Formulation: With patient Time For Goal Achievement: 08/19/21 Potential to Achieve Goals: Good ADL Goals Pt Will Perform Lower Body Bathing: Independently;sitting/lateral leans;sit to/from stand;with adaptive equipment Pt Will Perform Lower Body Dressing: Independently;with adaptive equipment;sitting/lateral leans;sit to/from stand Pt Will Transfer to Toilet: Independently;ambulating Pt Will Perform Toileting - Clothing Manipulation and hygiene: Independently Additional ADL Goal #1: Patient will be able to complete functional task in standing for 3-5 minutes without need for seated rest break to promote return to previous level of independence.  OT Frequency: Min 2X/week    Co-evaluation              AM-PAC OT "6 Clicks" Daily Activity     Outcome Measure Help from another person eating meals?: A Little Help from another person taking care of personal grooming?: A Little Help from another person toileting, which includes using toliet, bedpan, or urinal?: A Little Help from another person bathing (including washing, rinsing, drying)?: A Lot Help from another person to put on and taking off regular upper body clothing?: A Little Help from another person to put on and taking off regular lower body clothing?: A Lot 6  Click Score: 16   End of Session Nurse Communication: Mobility status  Activity Tolerance: Patient limited by fatigue;Patient limited by pain Patient left: in bed;with call bell/phone within reach  OT Visit Diagnosis:  Unsteadiness on feet (R26.81);Other abnormalities of gait and mobility (R26.89);Muscle weakness (generalized) (M62.81);Pain Pain - Right/Left:  (Back) Pain - part of body:  (Back)                Time: 3668-1594 OT Time Calculation (min): 10 min Charges:  OT General Charges $OT Visit: 1 Visit OT Evaluation $OT Eval Moderate Complexity: 1 Mod  Corinne Ports E. Shakura Cowing, OTR/L Acute Rehabilitation Services [phone]   Ascencion Dike 08/05/2021, 12:37 PM

## 2021-08-05 NOTE — Evaluation (Addendum)
Physical Therapy Evaluation Patient Details Name: Charles Lloyd. MRN: [id number] DOB: [birth year] Today's Date: 08/05/2021  History of Present Illness  81 y.o. male presented to the ED 08/04/21 with bilateral lower extremity weakness/numbness after a mechanical fall at home.  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 Neurosurgery consult pending. PMH significant of chronic HFpEF, A-fib on Eliquis, hypertension, hyperlipidemia, type 2 diabetes, bil TKA, history of skin and prostate cancer, gout  Clinical Impression   Pt admitted secondary to problem above with deficits below. PTA patient was living alone in 2 story townhome with his bedroom upstairs. He was independent and very active (including playing golf).  Pt currently has weakness and numbness in bil LEs with recent fall. He requires min guard assist with RW for basic mobility and anticipate climbing stairs will be difficult. Neurosurgery consult is pending.  Anticipate patient will benefit from PT to address problems listed below.Will continue to follow acutely to maximize functional mobility independence and safety.          Recommendations for follow up therapy are one component of a multi-disciplinary discharge planning process, led by the attending physician.  Recommendations may be updated based on patient status, additional functional criteria and insurance authorization.  Follow Up Recommendations Home health PT    Assistance Recommended at Discharge PRN  Patient can return home with the following  Assistance with cooking/housework;Help with stairs or ramp for entrance    Equipment Recommendations Rolling walker (2 wheels)  Recommendations for Other Services  OT consult    Functional Status Assessment Patient has had a recent decline in their functional status and demonstrates the ability to make significant improvements in function in a reasonable and predictable amount of time.      Precautions / Restrictions Precautions Precautions: Fall      Mobility  Bed Mobility Overal bed mobility: Modified Independent             General bed mobility comments: HOB elevated    Transfers Overall transfer level: Needs assistance Equipment used: Rolling walker (2 wheels) Transfers: Sit to/from Stand Sit to Stand: Min guard           General transfer comment: vc for safe hand placment/use of RW;    Ambulation/Gait Ambulation/Gait assistance: Min guard Gait Distance (Feet): 170 Feet Assistive device: Rolling walker (2 wheels) Gait Pattern/deviations: Step-through pattern, Decreased stride length       General Gait Details: vc for safe use of RW,especially proximity to [employer] Mobility    Modified Rankin (Stroke Patients Only)       Balance Overall balance assessment: Needs assistance Sitting-balance support: No upper extremity supported Sitting balance-Leahy Scale: Normal     Standing balance support: No upper extremity supported Standing balance-Leahy Scale: Fair                               Pertinent Vitals/Pain Pain Assessment Pain Assessment: 0-10 Pain Score: 5  Pain Location: back Pain Descriptors / Indicators: Discomfort Pain Intervention(s): Limited activity within patient's tolerance, Monitored during session    Home Living Family/patient expects to be discharged to:: Private residence Living Arrangements: Alone Available Help at Discharge: Family;Available PRN/intermittently (son lives in [place]) Type of Home: House Home Access: Stairs to enter Entrance Stairs-Rails: None Entrance Stairs-Number of Steps: 1 Alternate Level Stairs-Number of Steps: 16  Home Layout: Two level;Bed/bath upstairs Home Equipment: None      Prior Function Prior Level of Function : Independent/Modified Independent;Driving             Mobility Comments: plays golf ADLs Comments: drives; son  vacuums 1x/week     Hand Dominance   Dominant Hand: Right    Extremity/Trunk Assessment   Upper Extremity Assessment Upper Extremity Assessment: Defer to OT evaluation    Lower Extremity Assessment Lower Extremity Assessment: RLE deficits/detail;LLE deficits/detail RLE Deficits / Details: hip flexion 3+, knee extension 3+, ankle DF 5/5 RLE Sensation: decreased light touch (feet and calves numb) RLE Coordination: WNL LLE Deficits / Details: hip flexion 3+, knee extension3-, ankle DF 4 LLE Sensation: decreased light touch (feet and calves numb)    Cervical / Trunk Assessment Cervical / Trunk Assessment: Normal  Communication   Communication: No difficulties  Cognition Arousal/Alertness: Awake/alert Behavior During Therapy: Flat affect Overall Cognitive Status: Within Functional Limits for tasks assessed                                          General Comments General comments (skin integrity, edema, etc.): VSS on RA    Exercises     Assessment/Plan    PT Assessment Patient needs continued PT services  PT Problem List Decreased strength;Decreased balance;Decreased mobility;Decreased knowledge of use of DME;Impaired sensation;Pain       PT Treatment Interventions DME instruction;Gait training;Stair training;Functional mobility training;Therapeutic activities;Therapeutic exercise;Patient/family education    PT Goals (Current goals can be found in the Care Plan section)  Acute Rehab PT Goals Patient Stated Goal: return to his baseline; playing golf PT Goal Formulation: With patient Time For Goal Achievement: 08/19/21 Potential to Achieve Goals: Good    Frequency Min 4X/week     Co-evaluation               AM-PAC PT "6 Clicks" Mobility  Outcome Measure Help needed turning from your back to your side while in a flat bed without using bedrails?: None Help needed moving from lying on your back to sitting on the side of a flat bed without  using bedrails?: None Help needed moving to and from a bed to a chair (including a wheelchair)?: A Little Help needed standing up from a chair using your arms (e.g., wheelchair or bedside chair)?: A Little Help needed to walk in hospital room?: A Little Help needed climbing 3-5 steps with a railing? : A Lot 6 Click Score: 19    End of Session Equipment Utilized During Treatment: Gait belt Activity Tolerance: Patient tolerated treatment well;No increased pain Patient left: in chair;with call bell/phone within reach;with chair alarm set Nurse Communication: Mobility status;Other (comment) (legs remain weaker with some numbness) PT Visit Diagnosis: Unsteadiness on feet (R26.81);Difficulty in walking, not elsewhere classified (R26.2)    Time: 2956-2130 PT Time Calculation (min) (ACUTE ONLY): 30 min   Charges:   PT Evaluation $PT Eval Low Complexity: 1 Low PT Treatments $Gait Training: 8-22 mins         Arby Barrette, PT Acute Rehabilitation Services  Office [phone]   Rexanne Mano 08/05/2021, 8:09 AM

## 2021-08-06 DIAGNOSIS — R6510 Systemic inflammatory response syndrome (SIRS) of non-infectious origin without acute organ dysfunction: Secondary | ICD-10-CM | POA: Diagnosis not present

## 2021-08-06 DIAGNOSIS — R509 Fever, unspecified: Secondary | ICD-10-CM

## 2021-08-06 DIAGNOSIS — M48062 Spinal stenosis, lumbar region with neurogenic claudication: Secondary | ICD-10-CM

## 2021-08-06 DIAGNOSIS — N1832 Chronic kidney disease, stage 3b: Secondary | ICD-10-CM | POA: Diagnosis not present

## 2021-08-06 DIAGNOSIS — E876 Hypokalemia: Secondary | ICD-10-CM

## 2021-08-06 DIAGNOSIS — D72810 Lymphocytopenia: Secondary | ICD-10-CM

## 2021-08-06 LAB — URINE CULTURE: Culture: NO GROWTH

## 2021-08-06 LAB — RESPIRATORY PANEL BY PCR

## 2021-08-06 LAB — CBC WITH DIFFERENTIAL/PLATELET
Abs Immature Granulocytes: 0.03 10*3/uL (ref 0.00–0.07)
Basophils Absolute: 0 10*3/uL (ref 0.0–0.1)
Basophils Relative: 1 %
Eosinophils Absolute: 0 10*3/uL (ref 0.0–0.5)
Eosinophils Relative: 0 %
HCT: 26.3 % — ABNORMAL LOW (ref 39.0–52.0)
Hemoglobin: 8.7 g/dL — ABNORMAL LOW (ref 13.0–17.0)
Immature Granulocytes: 1 %
Lymphocytes Relative: 17 %
Lymphs Abs: 0.5 10*3/uL — ABNORMAL LOW (ref 0.7–4.0)
MCH: 30.4 pg (ref 26.0–34.0)
MCHC: 33.1 g/dL (ref 30.0–36.0)
MCV: 92 fL (ref 80.0–100.0)
Monocytes Absolute: 0.2 10*3/uL (ref 0.1–1.0)
Monocytes Relative: 7 %
Neutro Abs: 2.1 10*3/uL (ref 1.7–7.7)
Neutrophils Relative %: 74 %
Platelets: 83 10*3/uL — ABNORMAL LOW (ref 150–400)
RBC: 2.86 MIL/uL — ABNORMAL LOW (ref 4.22–5.81)
RDW: 15.4 % (ref 11.5–15.5)
WBC: 2.9 10*3/uL — ABNORMAL LOW (ref 4.0–10.5)
nRBC: 0 % (ref 0.0–0.2)

## 2021-08-06 LAB — RENAL FUNCTION PANEL
Albumin: 2.4 g/dL — ABNORMAL LOW (ref 3.5–5.0)
Anion gap: 9 (ref 5–15)
BUN: 25 mg/dL — ABNORMAL HIGH (ref 8–23)
CO2: 20 mmol/L — ABNORMAL LOW (ref 22–32)
Calcium: 7.7 mg/dL — ABNORMAL LOW (ref 8.9–10.3)
Chloride: 106 mmol/L (ref 98–111)
Creatinine, Ser: 1.97 mg/dL — ABNORMAL HIGH (ref 0.61–1.24)
GFR, Estimated: 34 mL/min — ABNORMAL LOW (ref >60)
Glucose, Bld: 143 mg/dL — ABNORMAL HIGH (ref 70–99)
Phosphorus: 3.6 mg/dL (ref 2.5–4.6)
Potassium: 3.2 mmol/L — ABNORMAL LOW (ref 3.5–5.1)
Sodium: 135 mmol/L (ref 135–145)

## 2021-08-06 LAB — FERRITIN: Ferritin: 57 ng/mL (ref 24–336)

## 2021-08-06 LAB — MAGNESIUM: Magnesium: 1.7 mg/dL (ref 1.7–2.4)

## 2021-08-06 LAB — VITAMIN B12: Vitamin B-12: 229 pg/mL (ref 180–914)

## 2021-08-06 LAB — HEPATIC FUNCTION PANEL
ALT: 33 U/L (ref 0–44)
AST: 75 U/L — ABNORMAL HIGH (ref 15–41)
Albumin: 2.6 g/dL — ABNORMAL LOW (ref 3.5–5.0)
Alkaline Phosphatase: 68 U/L (ref 38–126)
Bilirubin, Direct: 0.5 mg/dL — ABNORMAL HIGH (ref 0.0–0.2)
Indirect Bilirubin: 0.7 mg/dL (ref 0.3–0.9)
Total Bilirubin: 1.2 mg/dL (ref 0.3–1.2)
Total Protein: 5.3 g/dL — ABNORMAL LOW (ref 6.5–8.1)

## 2021-08-06 LAB — RETICULOCYTES
Immature Retic Fract: 16.5 % — ABNORMAL HIGH (ref 2.3–15.9)
RBC.: 2.89 MIL/uL — ABNORMAL LOW (ref 4.22–5.81)
Retic Count, Absolute: 52 10*3/uL (ref 19.0–186.0)
Retic Ct Pct: 1.8 % (ref 0.4–3.1)

## 2021-08-06 LAB — GLUCOSE, CAPILLARY
Glucose-Capillary: 152 mg/dL — ABNORMAL HIGH (ref 70–99)
Glucose-Capillary: 208 mg/dL — ABNORMAL HIGH (ref 70–99)
Glucose-Capillary: 128 mg/dL — ABNORMAL HIGH (ref 70–99)
Glucose-Capillary: 120 mg/dL — ABNORMAL HIGH (ref 70–99)

## 2021-08-06 LAB — FOLATE: Folate: >40 ng/mL (ref >5.9)

## 2021-08-06 LAB — IRON AND TIBC
Iron: 24 ug/dL — ABNORMAL LOW (ref 45–182)
Saturation Ratios: 9 % — ABNORMAL LOW (ref 17.9–39.5)
TIBC: 280 ug/dL (ref 250–450)
UIBC: 256 ug/dL

## 2021-08-06 LAB — SEDIMENTATION RATE: Sed Rate: 52 mm/hr — ABNORMAL HIGH (ref 0–16)

## 2021-08-06 LAB — C-REACTIVE PROTEIN: CRP: 8.4 mg/dL — ABNORMAL HIGH (ref <1.0)

## 2021-08-06 LAB — URIC ACID: Uric Acid, Serum: 7 mg/dL (ref 3.7–8.6)

## 2021-08-06 MED ORDER — COLCHICINE 0.6 MG PO TABS
0.6000 mg | ORAL_TABLET | Freq: Every day | ORAL | Status: DC
  Filled 2021-08-06: qty 1

## 2021-08-06 MED ORDER — POTASSIUM CHLORIDE CRYS ER 20 MEQ PO TBCR
40.0000 meq | EXTENDED_RELEASE_TABLET | Freq: Once | ORAL | Status: AC
  Administered 2021-08-06: 40 meq via ORAL
  Filled 2021-08-06: qty 2

## 2021-08-06 MED ORDER — SODIUM CHLORIDE 0.9 % IV SOLN
100.0000 mg | Freq: Two times a day (BID) | INTRAVENOUS | Status: DC
  Administered 2021-08-06 – 2021-08-07 (×4): 100 mg via INTRAVENOUS
  Filled 2021-08-06 (×7): qty 100

## 2021-08-06 MED ORDER — MAGNESIUM SULFATE IN D5W 1-5 GM/100ML-% IV SOLN
1.0000 g | Freq: Once | INTRAVENOUS | Status: AC
  Administered 2021-08-06: 1 g via INTRAVENOUS
  Filled 2021-08-06: qty 100

## 2021-08-06 NOTE — Consult Note (Signed)
[facility]  Total days of antibiotics 3 vanco/cefepime/metro        Reason for Consult:FUO   Referring Physician: gonfa  Principal Problem:   Spinal stenosis Active Problems:   Paroxysmal A-fib (HCC)   Chronic diastolic (congestive) heart failure (HCC)   Neoplasm of prostate, malignant (HCC)   NIDDM-2 with polyneuropathy and hyperglycemia   Asthma   Essential hypertension   Chronic kidney disease, stage 3b (HCC)   SIRS (systemic inflammatory response syndrome) (HCC)   Thrombocytopenia (HCC)   Hyperglycemia   Pancytopenia ([place])   Fall at home, initial encounter   Paresthesia of lower extremity   Lower extremity weakness    HPI: Charles Lloyd. is a 81 y.o. male with history of paroxysmal afib, diastolic HF, CKD 3, E7OJ with peripheral neuropathy, gout and spinal stenosis, who was admitted for a ground level fall while placing on pants, has increased weakness and numbness to legs. He did not lose consciousness did not have any other systemic symptoms however once he was was seen in the ED, he was found to have Fever of 102.50F, and other lab abn include leukopenia/lymphopenia with thrombocytopenia. Mild aki. Plus had episode of nausea and vomiting. Infectious work up, bland UA, cxr no signs of infiltrate, blood cx are NGTD at 48hrs. He did undergo MRI of spine that showed severe stenosis to L4-L5 -for which he was seen by neurosurgery who offered elective decompression but patient wanted to do more conservative measures such as epidural injection. Patient has been on broad spectrum abtx with vanco/cefepime/metronidazole but Fever still elevated yesterday at 101F. WBC unchanged  He denies tick bites has been outdoors in days prior to admission, golfing and going to his grand-daughter's softball game. He reports might have felt fatigue in the days prior to admission.  He reports having fall in the past, hospitalized roughly a year ago  Past Medical  History:  Diagnosis Date   Acute tracheobronchitis 01/05/2019   B12 deficiency    Basal cell carcinoma (BCC) of left side of nose 01/28/2018   Cancer ([place])    Cardiomyopathy ([place]) 03/10/2018   With chronic atrial fibrillation   CHF (congestive heart failure) ([place]) 04/16/2013   Functional class II, ejection fraction 35-40%  Formatting of this note might be different from the original. Functional class II, ejection fraction 35-40%   Chronic anticoagulation    Chronic diastolic (congestive) heart failure ([place]) 04/16/2013   Functional class II, ejection fraction 35-40%  Last Assessment & Plan:  Clinically stable Volume well controlled meds reviewed   Chronic diastolic CHF (congestive heart failure) ([place]) 04/16/2013   Functional class II, ejection fraction 35-40%  Last Assessment & Plan:  Clinically stable Volume well controlled meds reviewed   Colon polyps 04/16/2013   Diabetic neuropathy (HCC)    Eosinophil count raised 02/17/2019   Essential hypertension 01/06/2010   Last Assessment & Plan:  Well controlled Continue med management   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  Steroid usage was discontinued several months ago.  Follow up with Dermatology as planned.   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  Last Assessment & Plan:  Id following ?ongoing abx managementy reported Request records   Infection of prosthetic right knee joint ([place])    Insomnia 03/04/2012   Grief with loss of wife 02/20/12  Last Assessment & Plan:  Improved sx  contineu xanax prn Se discussed   Lesion of liver 04/20/2019   -On cardiac CT 02/2019    Mild reactive airways disease    Neoplasm of prostate 04/16/2013   Neoplasm of prostate, malignant ([place]) 01/06/2010   [name] S/p radiation therapy   Last Assessment & Plan:  Repeat psa today No urinary sx Pt reported fatigue/poor appetite   On amiodarone therapy 09/07/2013   On continuous oral anticoagulation 01/28/2018   Other activity(E029.9) 04/20/2013   Formatting of this note might be different from the original. Transthoracic Echocardiogram-03/10/2013-[facility]: Normal left ventricular wall thickness and cavity size.  Global left ventricular systolic function is moderately reduced.  The estimated ejection fraction is 35-40%.  The left atrium is moderately enlarged.  The right atrium is mildly enlarged.  No significant valvular    Overweight (BMI 25.0-29.9) 10/22/2016   Persistent atrial fibrillation ([place]) 04/16/2013   Last Assessment & Plan:  Rate controlled Continue med management eliquis for stroke preventino  Formatting of this note might be different from the original.  Drug  HX Current Rx Pre-ABL inefficacy Pre-ABL intolerant Post-ABL inefficacy Post-ABL intolerant max dose/24h M/Y end comments  sotalol                  dofetilide                  flecainide                  propafenone                  am   S/P TKR (total knee replacement), bilateral    Secondary hypercoagulable state ([place]) 04/09/2019   Tinea cruris 04/16/2013   Type 2 diabetes mellitus with diabetic polyneuropathy, without long-term current use of insulin ([place]) 04/16/2013   Last Assessment & Plan:  Labs today Pt reports well controlled on ambulatory monitoring   Vitamin D deficiency 07/25/2018    Allergies: No Known Allergies  Current antibiotics:   MEDICATIONS:  allopurinol  300 mg Oral Daily   apixaban  5 mg Oral BID   atorvastatin  20 mg Oral Daily   carvedilol  12.5 mg Oral BID   cycloSPORINE  1 drop Both Eyes BID   flecainide  100 mg Oral BID   fluticasone furoate-vilanterol  1 puff Inhalation  Daily   gabapentin  600 mg Oral QHS   insulin aspart  0-6 Units Subcutaneous TID WC   montelukast  10 mg Oral QHS    Social History   Tobacco Use   Smoking status: Never   Smokeless tobacco: Never  Vaping Use   Vaping Use: Never used  Substance Use Topics   Alcohol use: Yes    Alcohol/week: 14.0 standard drinks of alcohol    Types: 14 Glasses of wine per week   Drug use: Never    Family History  Problem Relation Age of Onset   Cancer Mother    Depression Mother    Early death Mother    Cancer Father    Depression Father    Early death Father      Review of Systems  Constitutional: Negative for fever, chills, diaphoresis, activity change, appetite change, fatigue and unexpected weight  change.  HENT: Negative for congestion, sore throat, rhinorrhea, sneezing, trouble swallowing and sinus pressure.  Eyes: Negative for photophobia and visual disturbance.  Respiratory: Negative for cough, chest tightness, shortness of breath, wheezing and stridor.  Cardiovascular: Negative for chest pain, palpitations and leg swelling.  Gastrointestinal: Negative for nausea, vomiting, abdominal pain, diarrhea, constipation, blood in stool, abdominal distention and anal bleeding.  Genitourinary: Negative for dysuria, hematuria, flank pain and difficulty urinating.  Musculoskeletal: +chronic low back pain. Negative for myalgias, back pain, joint swelling, arthralgias and gait problem.  Skin: Negative for color change, pallor, rash and wound.  Neurological: Negative for dizziness, tremors, weakness and light-headedness.  Hematological: Negative for adenopathy. Does not bruise/bleed easily.  Psychiatric/Behavioral: Negative for behavioral problems, confusion, sleep disturbance, dysphoric mood, decreased concentration and agitation.    OBJECTIVE: Temp:  [98.9 F (37.2 C)-100.7 F (38.2 C)] 99.4 F (37.4 C) (06/11 0817) Pulse Rate:  [63-73] 67 (06/11 0817) Resp:  [13-21] 17 (06/11 0817) BP:  (115-135)/(47-61) 124/61 (06/11 0817) SpO2:  [90 %-95 %] 94 % (06/11 0817) Physical Exam  Constitutional: He is oriented to person, place, and time. He appears well-developed and well-nourished. No distress.  HENT: +slight scleral icterus Mouth/Throat: Oropharynx is clear and moist. No oropharyngeal exudate.  Cardiovascular: Normal rate, regular rhythm and normal heart sounds. Exam reveals no gallop and no friction rub.  No murmur heard.  Pulmonary/Chest: Effort normal and breath sounds normal. No respiratory distress. He has no wheezes.  Abdominal: Soft. Bowel sounds are normal. He exhibits no distension. There is no tenderness.  Lymphadenopathy:  He has no cervical adenopathy.  Neurological: He is alert and oriented to person, place, and time.  Skin: Skin is warm and dry. No rash noted. No erythema. Left arm skin tear Psychiatric: He has a normal mood and affect. His behavior is normal.    LABS: Results for orders placed or performed during the hospital encounter of 08/04/21 (from the past 48 hour(s))  Urinalysis, Routine w reflex microscopic Urine, Clean Catch     Status: Abnormal   Collection Time: 08/04/21  4:11 PM  Result Value Ref Range   Color, Urine YELLOW YELLOW   APPearance CLEAR CLEAR   Specific Gravity, Urine 1.014 1.005 - 1.030   pH 5.0 5.0 - 8.0   Glucose, UA NEGATIVE NEGATIVE mg/dL   Hgb urine dipstick NEGATIVE NEGATIVE   Bilirubin Urine NEGATIVE NEGATIVE   Ketones, ur NEGATIVE NEGATIVE mg/dL   Protein, ur 30 (A) NEGATIVE mg/dL   Nitrite NEGATIVE NEGATIVE   Leukocytes,Ua NEGATIVE NEGATIVE   RBC / HPF 0-5 0 - 5 RBC/hpf   WBC, UA 0-5 0 - 5 WBC/hpf   Bacteria, UA NONE SEEN NONE SEEN    Comment: Performed at [facility] [address], [place], [place] [address]  Basic metabolic panel     Status: Abnormal   Collection Time: 08/05/21  4:37 AM  Result Value Ref Range   Sodium 135 135 - 145 mmol/L   Potassium 3.6 3.5 - 5.1 mmol/L   Chloride 107 98 - 111  mmol/L   CO2 20 (L) 22 - 32 mmol/L   Glucose, Bld 140 (H) 70 - 99 mg/dL    Comment: Glucose reference range applies only to samples taken after fasting for at least 8 hours.   BUN 21 8 - 23 mg/dL   Creatinine, Ser 1.64 (H) 0.61 - 1.24 mg/dL   Calcium 7.7 (L) 8.9 - 10.3 mg/dL   GFR, Estimated 43 (L) >60  mL/min    Comment: (NOTE) Calculated using the CKD-EPI Creatinine Equation (2021)    Anion gap 8 5 - 15    Comment: Performed at [facility], [place] [address], [place], [place] [address]  CBC     Status: Abnormal   Collection Time: 08/05/21  4:37 AM  Result Value Ref Range   WBC 3.0 (L) 4.0 - 10.5 K/uL   RBC 2.94 (L) 4.22 - 5.81 MIL/uL   Hemoglobin 8.8 (L) 13.0 - 17.0 g/dL   HCT 26.9 (L) 39.0 - 52.0 %   MCV 91.5 80.0 - 100.0 fL   MCH 29.9 26.0 - 34.0 pg   MCHC 32.7 30.0 - 36.0 g/dL   RDW 15.4 11.5 - 15.5 %   Platelets 84 (L) 150 - 400 K/uL    Comment: Immature Platelet Fraction may be clinically indicated, consider ordering this additional test [id number] CONSISTENT WITH PREVIOUS RESULT REPEATED TO VERIFY    nRBC 0.0 0.0 - 0.2 %    Comment: Performed at [facility], [place] [address] [place], [place] [address]  Procalcitonin - Baseline     Status: None   Collection Time: 08/05/21  4:37 AM  Result Value Ref Range   Procalcitonin 0.94 ng/mL    Comment:        Interpretation: PCT > 0.5 ng/mL and <= 2 ng/mL: Systemic infection (sepsis) is possible, but other conditions are known to elevate PCT as well. (NOTE)       Sepsis PCT Algorithm           Lower Respiratory Tract                                      Infection PCT Algorithm    ----------------------------     ----------------------------         PCT < 0.25 ng/mL                PCT < 0.10 ng/mL          Strongly encourage             Strongly discourage   discontinuation of antibiotics    initiation of antibiotics    ----------------------------     -----------------------------       PCT 0.25 - 0.50 ng/mL             PCT 0.10 - 0.25 ng/mL               OR       >80% decrease in PCT            Discourage initiation of                                            antibiotics      Encourage discontinuation           of antibiotics    ----------------------------     -----------------------------         PCT >= 0.50 ng/mL              PCT 0.26 - 0.50 ng/mL                AND       <80% decrease in PCT  Encourage initiation of                                             antibiotics       Encourage continuation           of antibiotics    ----------------------------     -----------------------------        PCT >= 0.50 ng/mL                  PCT > 0.50 ng/mL               AND         increase in PCT                  Strongly encourage                                      initiation of antibiotics    Strongly encourage escalation           of antibiotics                                     -----------------------------                                           PCT <= 0.25 ng/mL                                                 OR                                        > 80% decrease in PCT                                      Discontinue / Do not initiate                                             antibiotics  Performed at [facility], [address] [place], [place] [address]   Hemoglobin A1c     Status: Abnormal   Collection Time: 08/05/21  4:47 AM  Result Value Ref Range   Hgb A1c MFr Bld 6.9 (H) 4.8 - 5.6 %    Comment: (NOTE) Pre diabetes:          5.7%-6.4%  Diabetes:              >6.4%  Glycemic control for   <7.0% adults with diabetes    Mean Plasma Glucose 151.33 mg/dL    Comment: Performed at [facility] [address], [place], [place] [address]  Glucose, capillary     Status:  Abnormal   Collection Time: 08/05/21  9:16 PM  Result Value Ref Range   Glucose-Capillary 134 (H) 70 - 99 mg/dL    Comment: Glucose reference range applies only to samples  taken after fasting for at least 8 hours.  Sedimentation rate     Status: Abnormal   Collection Time: 08/06/21  1:50 AM  Result Value Ref Range   Sed Rate 52 (H) 0 - 16 mm/hr    Comment: Performed at [facility] [address], [place], [place] [address]  C-reactive protein     Status: Abnormal   Collection Time: 08/06/21  1:50 AM  Result Value Ref Range   CRP 8.4 (H) <1.0 mg/dL    Comment: Performed at [facility] [address], [place], [place] [address]  Renal function panel     Status: Abnormal   Collection Time: 08/06/21  1:50 AM  Result Value Ref Range   Sodium 135 135 - 145 mmol/L   Potassium 3.2 (L) 3.5 - 5.1 mmol/L   Chloride 106 98 - 111 mmol/L   CO2 20 (L) 22 - 32 mmol/L   Glucose, Bld 143 (H) 70 - 99 mg/dL    Comment: Glucose reference range applies only to samples taken after fasting for at least 8 hours.   BUN 25 (H) 8 - 23 mg/dL   Creatinine, Ser 1.97 (H) 0.61 - 1.24 mg/dL   Calcium 7.7 (L) 8.9 - 10.3 mg/dL   Phosphorus 3.6 2.5 - 4.6 mg/dL   Albumin 2.4 (L) 3.5 - 5.0 g/dL   GFR, Estimated 34 (L) >60 mL/min    Comment: (NOTE) Calculated using the CKD-EPI Creatinine Equation (2021)    Anion gap 9 5 - 15    Comment: Performed at [facility] [address] [place], [place] [address]  Magnesium     Status: None   Collection Time: 08/06/21  1:50 AM  Result Value Ref Range   Magnesium 1.7 1.7 - 2.4 mg/dL    Comment: Performed at [facility] [address] [place], [place] [address]  CBC with Differential/Platelet     Status: Abnormal   Collection Time: 08/06/21  1:50 AM  Result Value Ref Range   WBC 2.9 (L) 4.0 - 10.5 K/uL   RBC 2.86 (L) 4.22 - 5.81 MIL/uL   Hemoglobin 8.7 (L) 13.0 - 17.0 g/dL   HCT 26.3 (L) 39.0 - 52.0 %   MCV 92.0 80.0 - 100.0 fL   MCH 30.4 26.0 - 34.0 pg   MCHC 33.1 30.0 - 36.0 g/dL   RDW 15.4 11.5 - 15.5 %   Platelets 83 (L) 150 - 400 K/uL    Comment: Immature Platelet Fraction may be clinically  indicated, consider ordering this additional test [id number] CONSISTENT WITH PREVIOUS RESULT REPEATED TO VERIFY    nRBC 0.0 0.0 - 0.2 %   Neutrophils Relative % 74 %   Neutro Abs 2.1 1.7 - 7.7 K/uL   Lymphocytes Relative 17 %   Lymphs Abs 0.5 (L) 0.7 - 4.0 K/uL   Monocytes Relative 7 %   Monocytes Absolute 0.2 0.1 - 1.0 K/uL   Eosinophils Relative 0 %   Eosinophils Absolute 0.0 0.0 - 0.5 K/uL   Basophils Relative 1 %   Basophils Absolute 0.0 0.0 - 0.1 K/uL   Immature Granulocytes 1 %   Abs Immature Granulocytes 0.03 0.00 - 0.07 K/uL    Comment: Performed at [facility], [place]  [address], [place], [place] [address]  Vitamin B12     Status: None   Collection Time: 08/06/21  1:50 AM  Result Value Ref Range   Vitamin B-12 229 180 - 914 pg/mL    Comment: (NOTE) This assay is not validated for testing neonatal or myeloproliferative syndrome specimens for Vitamin B12 levels. Performed at [facility], [place] [address] [place], [place] [address]   Folate     Status: None   Collection Time: 08/06/21  1:50 AM  Result Value Ref Range   Folate >40.0 >5.9 ng/mL    Comment: Performed at [facility] [address] [place], [place] [address]  Iron and TIBC     Status: Abnormal   Collection Time: 08/06/21  1:50 AM  Result Value Ref Range   Iron 24 (L) 45 - 182 ug/dL   TIBC 280 250 - 450 ug/dL   Saturation Ratios 9 (L) 17.9 - 39.5 %   UIBC 256 ug/dL    Comment: Performed at [facility], [place] [address] [place], [place] [address]  Ferritin     Status: None   Collection Time: 08/06/21  1:50 AM  Result Value Ref Range   Ferritin 57 24 - 336 ng/mL    Comment: Performed at [facility] [address] [place], [place] [address]  Reticulocytes     Status: Abnormal   Collection Time: 08/06/21  1:50 AM  Result Value Ref Range   Retic Ct Pct 1.8 0.4 - 3.1 %   RBC. 2.89 (L) 4.22 - 5.81 MIL/uL   Retic Count, Absolute 52.0 19.0 - 186.0 K/uL   Immature Retic  Fract 16.5 (H) 2.3 - 15.9 %    Comment: Performed at [facility] [address] [place], [place] [address]  Uric acid     Status: None   Collection Time: 08/06/21  1:50 AM  Result Value Ref Range   Uric Acid, Serum 7.0 3.7 - 8.6 mg/dL    Comment: Performed at [facility] [address], [place], [place] [address]  Glucose, capillary     Status: Abnormal   Collection Time: 08/06/21  8:20 AM  Result Value Ref Range   Glucose-Capillary 152 (H) 70 - 99 mg/dL    Comment: Glucose reference range applies only to samples taken after fasting for at least 8 hours.    MICRO:  IMAGING: MR LUMBAR SPINE WO CONTRAST  Result Date: 08/04/2021 CLINICAL DATA:  Lumbar spinal stenosis. Fall. Bilateral lower extremity pain and weakness. EXAM: MRI LUMBAR SPINE WITHOUT CONTRAST TECHNIQUE: Multiplanar, multisequence MR imaging of the lumbar spine was performed. No intravenous contrast was administered. 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 Alignment: Unchanged grade 1 retrolisthesis of L2 on L3 and L5 on S1 and grade 1 anterolisthesis of L4 on L5. Vertebrae: No fracture or suspicious marrow lesion. Mild degenerative endplate edema at O1-2 and L2-3. Conus medullaris and cauda equina: Conus extends to the L1-2 level and is normal in signal. Redundancy of the cauda equina related to high-grade spinal stenosis. Paraspinal and other soft tissues: Unremarkable. Disc levels: Disc desiccation and disc space narrowing throughout the lumbar spine with disc narrowing being most severe at L1-2, L2-3, and L5-S1. T12-L1: Negative. L1-2: Disc bulging, endplate spurring, and moderate facet hypertrophy result in borderline to mild bilateral neural foraminal stenosis without spinal stenosis, unchanged. L2-3: Circumferential disc bulging, prominent dorsal epidural fat,  and moderate to severe facet and ligamentum flavum hypertrophy  result in severe spinal stenosis and moderate bilateral neural foraminal stenosis, stable to slightly progressed. L3-4: Circumferential disc bulging, prominent dorsal epidural fat, and moderate to severe facet and ligamentum flavum hypertrophy result in moderate spinal stenosis and mild-to-moderate bilateral neural foraminal stenosis, slightly progressed. Bilateral facet joint effusions. L4-5: Anterolisthesis with bulging uncovered disc and severe facet and ligamentum flavum hypertrophy result in severe spinal stenosis (3 mm AP spinal canal diameter) and mild bilateral neural foraminal stenosis, unchanged. Moderate bilateral facet joint effusions. L5-S1: Disc bulging and endplate spurring eccentric to the right and moderate facet hypertrophy result in moderate right neural foraminal stenosis without spinal stenosis, unchanged. S1-2: Rudimentary disc.  No stenosis. IMPRESSION: 1. 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 2. Stable to slight progression of severe spinal stenosis and moderate neural foraminal stenosis at L2-3. 3. Slight progression of moderate spinal stenosis at L3-4. Electronically Signed   By: Logan Bores M.D.   On: 08/04/2021 20:22   DG Chest 1 View  Result Date: 08/04/2021 CLINICAL DATA:  Fall. EXAM: CHEST  1 VIEW COMPARISON:  Chest x-ray dated December 30, 2020. FINDINGS: Stable cardiomediastinal silhouette. Normal pulmonary vascularity. Trace left pleural effusion. No consolidation or pneumothorax. No acute osseous abnormality. IMPRESSION: 1. Trace left pleural effusion. Electronically Signed   By: Titus Dubin M.D.   On: 08/04/2021 11:34   DG Lumbar Spine Complete  Result Date: 08/04/2021 CLINICAL DATA:  Fall . EXAM: LUMBAR SPINE - COMPLETE 4+ VIEW COMPARISON:  None Available. FINDINGS: No evidence for an acute fracture. There is diffuse loss of intervertebral disc height in the lumbar spine with diffuse endplate degeneration. Trace anterolisthesis of L3 on 4 evident.  IMPRESSION: Negative. Electronically Signed   By: Misty Stanley M.D.   On: 08/04/2021 11:30     Assessment/Plan:  fever, leukopenia/lymphopenia/new thrombocytopenia -- concerning for tickborne illness, will empirically  -  start iv doxycycline '100mg'$  bid - stop other abtx - see if any change to response to therapy - will check hepatic panel, lyme, and RMSF  Scleral icterus = will check hepatic panel. If abn, please check RUQ U/S does not have GI symptoms to suggest cholangitis but has some new onset diarrhea  Diarrhea = please check cdiff  If work up is normal then will need to do non-infectious work up for FUO.  Elzie Rings [facility] [phone]

## 2021-08-06 NOTE — Progress Notes (Signed)
PROGRESS NOTE  Charles Lloyd. KVQ:[id number] DOB: 12-Jan-1941   PCP: Isaac Bliss, [name], MD  Patient is from: Home.  Lives alone.  Ambulates independently at baseline.  DOA: 08/04/2021 LOS: 1  Chief complaints Chief Complaint  Patient presents with   Fall     Brief Narrative / Interim history: 81 year old M with PMH of HFpEF, A-fib on Eliquis, DM-2, HTN, HLD, prostate cancer, gout, chronic back pain with radiculopathy presenting with bilateral lower extremity weakness and numbness below his knees after accidental fall at home.  Reportedly, he fell while trying to put on his pants but did not hit his head or LOC.  No bowel or bladder habit change.  In ED, febrile to 102.4.  WBC 3.0.  Platelets 95 (acute).  UA and CXR without significant finding.  MRI lumbar spine showed unchanged markedly severe spinal stenosis at L4-5 and stable to slight progression of severe spinal stenosis and moderate neuroforaminal stenosis at L2-3.  Neurosurgery consulted.  Cultures drawn.  Patient was started on IV fluid and broad-spectrum antibiotics, and admitted.  The next day, evaluated by neurosurgery, and decided to try epidural steroid injection outpatient before decompressive surgery.  Blood and urine cultures NGTD.  ID consulted, and stopped BSA and started doxycycline.  Subjective: Seen and examined earlier this morning.  No major events overnight of this morning.  Seems he had an episode of diarrhea last night.  C. difficile and GIP ordered but patient has not had further diarrhea.  He had mild fever 200.7 about midnight.  He has no complaint this morning other than lack of sleep and rest in the hospital.  He denies chest pain, dyspnea, cough, nausea, vomiting, abdominal pain, UTI symptoms or joint pains.   Objective: Vitals:   08/06/21 0047 08/06/21 0414 08/06/21 0817 08/06/21 1207  BP:  (!) 115/47 124/61 (!) 95/52  Pulse:  63 67 64  Resp:  '17 17 19  '$ Temp: 100.1 F (37.8 C) 99.6 F (37.6  C) 99.4 F (37.4 C) 98.9 F (37.2 C)  TempSrc:  Oral Oral Oral  SpO2:  95% 94% 92%  Weight:      Height:        Examination:  GENERAL: No apparent distress.  Nontoxic. HEENT: MMM.  Vision and hearing grossly intact.  NECK: Supple.  No apparent JVD.  RESP:  No IWOB.  Fair aeration bilaterally. CVS:  RRR. Heart sounds normal.  ABD/GI/GU: BS+. Abd soft, NTND.  MSK/EXT:  Moves extremities. No apparent deformity. No edema.  SKIN: no apparent skin lesion or wound NEURO: Awake and alert. Oriented appropriately.  No apparent focal neuro deficit. PSYCH: Calm. Normal affect.   Procedures:  None Microbiology summarized: COVID-19 PCR nonreactive. Blood cultures NGTD Urine culture NGTD. RVP pending.  Assessment and plan: Principal Problem:   Spinal stenosis Active Problems:   SIRS (systemic inflammatory response syndrome) (HCC)   Pancytopenia (HCC)   Paroxysmal A-fib (HCC)   Chronic diastolic (congestive) heart failure (HCC)   Neoplasm of prostate, malignant (HCC)   NIDDM-2 with polyneuropathy and hyperglycemia   Asthma   Essential hypertension   Chronic kidney disease, stage 3b (HCC)   Thrombocytopenia (HCC)   Hyperglycemia   Fall at home, initial encounter   Paresthesia of lower extremity   Lower extremity weakness   FUO (fever of unknown origin)   Hypokalemia   Hypomagnesemia  Accidental fall at home-no prodromes.  Denies hitting his head or loss of consciousness. Lumbar spinal stenosis/bilateral LE weakness and paresthesia -MRI  lumbar spine as above.  No bowel or bladder habit change. Low suspicion for infection in this area. -Evaluated by neurosurgery-plan for epidural steroid injection outpatient before decompressive surgery -PT/OT eval  SIRS/FUO: Fever curve downtrending.  Leukopenia and thrombocytopenia stable.  No clear source of infection yet.  No respiratory or UTI symptoms.  Had an episode of diarrhea that seems to have resolved.  Abdominal exam benign.    Inflammatory markers elevated.  He was recently treated for gout flareup.  His uric acid is only 7.0.  He has no focal joint pain.  He has history of prostate cancer in remission after surgery and chemo in 2002.  Blood and urine cultures NGTD. -Infectious disease consulted-discontinued BSA and started doxycycline. -Check full RVP.  C. difficile and GIP ordered but no BM since last night -Continue broad-spectrum antibiotics -Start colchicine  Pancytopenia: Leukopenia and thrombocytopenia seems acute.  Slight drop in Hgb likely dilutional.  Baseline Hgb about 10.  Anemia panel suggests mild iron deficiency    Latest Ref Rng & Units 08/06/2021    1:50 AM 08/05/2021    4:37 AM 08/04/2021   10:30 AM  CBC  WBC 4.0 - 10.5 K/uL 2.9  3.0  3.0   Hemoglobin 13.0 - 17.0 g/dL 8.7  8.8  10.6   Hematocrit 39.0 - 52.0 % 26.3  26.9  32.8   Platelets 150 - 400 K/uL 83  84  95   -Continue monitoring -We will give IV iron prior to discharge.  CKD-3B: Creatinine is slightly up today likely from torsemide. Recent Labs    11/28/20 1456 12/14/20 1101 12/30/20 1613 12/31/20 0429 01/01/21 0650 01/06/21 0950 06/23/21 1351 08/04/21 1030 08/05/21 0437 08/06/21 0150  BUN 26 19 25* '20 10 16 '$ 39* 22 21 25*  CREATININE 1.50* 1.44 2.60* 1.98* 1.29* 1.24 1.61* 1.70* 1.64* 1.97*  -Hold torsemide -Continue monitoring  Diet controlled DM-2 with hyperglycemia and polyneuropathy: A1c 6.9%, was 5.8% on 3/16.  Does not seem to be on medication. Recent Labs  Lab 08/05/21 2116 08/06/21 0820 08/06/21 1209  GLUCAP 134* 152* 208*  -Continue SSI-increase to sensitive.  Chronic diastolic CHF/cardiomyopathy: TTE in 2020 with LVEF of 50 to 55% and no other significant finding.  Seems to be on torsemide, Coreg and Diovan at home. -Hold home medications. -Monitor intake and output, renal functions and electrolytes  Paroxysmal A-fib: Seems to be in sinus rhythm with first-degree AVB.  Followed by EP.  On Coreg, flecainide  and Eliquis at home. -Continue home medication and telemetry monitoring -Optimize electrolytes  Chronic COPD/asthma: Stable.  No respiratory symptoms. -Continue home Trelegy Ellipta with as needed nebs.  History of prostate cancer: In remission since surgery and chemotherapy in 2002 per patient's son. -Outpatient follow-up  History of gout: Recent flare treated with Sterapred and colchicine.  Uric acid 7.0. -Start colchicine. -Continue home allopurinol.  Hypokalemia/hypomagnesemia -Monitor replenish as appropriate.  Body mass index is 27.11 kg/m.         DVT prophylaxis:  SCDs Start: 08/04/21 2344 apixaban (ELIQUIS) tablet 5 mg  Code Status: Full code Family Communication: Updated patient's son at bedside. Level of care: Telemetry Medical Status is: Inpatient The patient will remain inpatient because: SIRS/FUO, pancytopenia requiring broad-spectrum IV antibiotics pending further evaluation for infectious source   Final disposition: Home with home health. Consultants:  Neurosurgery Infectious disease  Sch Meds:  Scheduled Meds:  allopurinol  300 mg Oral Daily   apixaban  5 mg Oral BID   atorvastatin  20  mg Oral Daily   carvedilol  12.5 mg Oral BID   colchicine  0.6 mg Oral Daily   cycloSPORINE  1 drop Both Eyes BID   flecainide  100 mg Oral BID   fluticasone furoate-vilanterol  1 puff Inhalation Daily   gabapentin  600 mg Oral QHS   insulin aspart  0-6 Units Subcutaneous TID WC   montelukast  10 mg Oral QHS   Continuous Infusions:  doxycycline (VIBRAMYCIN) IV 100 mg (08/06/21 1258)   PRN Meds:.acetaminophen **OR** acetaminophen, albuterol, melatonin  Antimicrobials: Anti-infectives (From admission, onward)    Start     Dose/Rate Route Frequency Ordered Stop   08/06/21 1045  doxycycline (VIBRAMYCIN) 100 mg in sodium chloride 0.9 % 250 mL IVPB        100 mg 125 mL/hr over 120 Minutes Intravenous Every 12 hours 08/06/21 0947     08/06/21 0400  vancomycin  (VANCOCIN) IVPB 1000 mg/200 mL premix  Status:  Discontinued        1,000 mg 200 mL/hr over 60 Minutes Intravenous Every 24 hours 08/05/21 1038 08/06/21 1103   08/05/21 0030  ceFEPIme (MAXIPIME) 2 g in sodium chloride 0.9 % 100 mL IVPB  Status:  Discontinued        2 g 200 mL/hr over 30 Minutes Intravenous 2 times daily 08/05/21 0029 08/06/21 1103   08/05/21 0030  vancomycin (VANCOREADY) IVPB 1500 mg/300 mL        1,500 mg 150 mL/hr over 120 Minutes Intravenous  Once 08/05/21 0029 08/05/21 0557   08/05/21 0000  metroNIDAZOLE (FLAGYL) IVPB 500 mg  Status:  Discontinued        500 mg 100 mL/hr over 60 Minutes Intravenous Every 12 hours 08/04/21 2347 08/06/21 1103        I have personally reviewed the following labs and images: CBC: Recent Labs  Lab 08/04/21 1030 08/05/21 0437 08/06/21 0150  WBC 3.0* 3.0* 2.9*  NEUTROABS 2.3  --  2.1  HGB 10.6* 8.8* 8.7*  HCT 32.8* 26.9* 26.3*  MCV 93.7 91.5 92.0  PLT 95* 84* 83*   BMP &GFR Recent Labs  Lab 08/04/21 1030 08/05/21 0437 08/06/21 0150  NA 134* 135 135  K 3.6 3.6 3.2*  CL 104 107 106  CO2 25 20* 20*  GLUCOSE 180* 140* 143*  BUN 22 21 25*  CREATININE 1.70* 1.64* 1.97*  CALCIUM 8.1* 7.7* 7.7*  MG  --   --  1.7  PHOS  --   --  3.6   Estimated Creatinine Clearance: 31.9 mL/min (A) (by C-G formula based on SCr of 1.97 mg/dL (H)). Liver & Pancreas: Recent Labs  Lab 08/04/21 1030 08/06/21 0150 08/06/21 1122  AST 45*  --  75*  ALT 24  --  33  ALKPHOS 85  --  68  BILITOT 1.0  --  1.2  PROT 6.1*  --  5.3*  ALBUMIN 3.1* 2.4* 2.6*   No results for input(s): "LIPASE", "AMYLASE" in the last 168 hours. No results for input(s): "AMMONIA" in the last 168 hours. Diabetic: Recent Labs    08/05/21 0447  HGBA1C 6.9*   Recent Labs  Lab 08/05/21 2116 08/06/21 0820 08/06/21 1209  GLUCAP 134* 152* 208*   Cardiac Enzymes: No results for input(s): "CKTOTAL", "CKMB", "CKMBINDEX", "TROPONINI" in the last 168 hours. No  results for input(s): "PROBNP" in the last 8760 hours. Coagulation Profile: Recent Labs  Lab 08/04/21 1030  INR 1.7*   Thyroid Function Tests: No results for input(s): "  TSH", "T4TOTAL", "FREET4", "T3FREE", "THYROIDAB" in the last 72 hours. Lipid Profile: No results for input(s): "CHOL", "HDL", "LDLCALC", "TRIG", "CHOLHDL", "LDLDIRECT" in the last 72 hours. Anemia Panel: Recent Labs    08/06/21 0150  VITAMINB12 229  FOLATE >40.0  FERRITIN 57  TIBC 280  IRON 24*  RETICCTPCT 1.8   Urine analysis:    Component Value Date/Time   COLORURINE YELLOW 08/04/2021 1611   APPEARANCEUR CLEAR 08/04/2021 1611   LABSPEC 1.014 08/04/2021 1611   PHURINE 5.0 08/04/2021 1611   GLUCOSEU NEGATIVE 08/04/2021 1611   HGBUR NEGATIVE 08/04/2021 1611   BILIRUBINUR NEGATIVE 08/04/2021 1611   KETONESUR NEGATIVE 08/04/2021 1611   PROTEINUR 30 (A) 08/04/2021 1611   NITRITE NEGATIVE 08/04/2021 1611   LEUKOCYTESUR NEGATIVE 08/04/2021 1611   Sepsis Labs: Invalid input(s): "PROCALCITONIN", "LACTICIDVEN"  Microbiology: Recent Results (from the past 240 hour(s))  Urine Culture     Status: None   Collection Time: 08/04/21 10:10 AM   Specimen: In/Out Cath Urine  Result Value Ref Range Status   Specimen Description IN/OUT CATH URINE  Final   Special Requests NONE  Final   Culture   Final    NO GROWTH Performed at [facility], [address] [place], [place] [address]    Report Status 08/06/2021 FINAL  Final  SARS Coronavirus 2 by RT PCR (hospital order, performed in [facility] hospital lab) *cepheid single result test* Anterior Nasal Swab     Status: None   Collection Time: 08/04/21 10:10 AM   Specimen: Anterior Nasal Swab  Result Value Ref Range Status   SARS Coronavirus 2 by RT PCR NEGATIVE NEGATIVE Final    Comment: (NOTE) SARS-CoV-2 target nucleic acids are NOT DETECTED.  The SARS-CoV-2 RNA is generally detectable in upper and lower respiratory specimens during the acute phase of  infection. The lowest concentration of SARS-CoV-2 viral copies this assay can detect is 250 copies / mL. A negative result does not preclude SARS-CoV-2 infection and should not be used as the sole basis for treatment or other patient management decisions.  A negative result may occur with improper specimen collection / handling, submission of specimen other than nasopharyngeal swab, presence of viral mutation(s) within the areas targeted by this assay, and inadequate number of viral copies (<250 copies / mL). A negative result must be combined with clinical observations, patient history, and epidemiological information.  Fact Sheet for Patients:   https://www.patel.info/  Fact Sheet for Healthcare Providers: https://hall.com/  This test is not yet approved or  cleared by the [place] FDA and has been authorized for detection and/or diagnosis of SARS-CoV-2 by FDA under an Emergency Use Authorization (EUA).  This EUA will remain in effect (meaning this test can be used) for the duration of the COVID-19 declaration under Section 564(b)(1) of the Act, 21 U.S.C. section 360bbb-3(b)(1), unless the authorization is terminated or revoked sooner.  Performed at [facility], [place] [address], [place], [place] [address]   Blood Culture (routine x 2)     Status: None (Preliminary result)   Collection Time: 08/04/21 10:30 AM   Specimen: BLOOD  Result Value Ref Range Status   Specimen Description BLOOD BLOOD LEFT HAND  Final   Special Requests   Final    BOTTLES DRAWN AEROBIC AND ANAEROBIC Blood Culture results may not be optimal due to an inadequate volume of blood received in culture bottles   Culture   Final    NO GROWTH < 24 HOURS Performed at  [facility], [place] [address], [place], [place] [address]    Report Status PENDING  Incomplete  Blood Culture (routine x 2)     Status: None (Preliminary result)   Collection Time: 08/04/21  10:40 AM   Specimen: BLOOD  Result Value Ref Range Status   Specimen Description BLOOD RIGHT ANTECUBITAL  Final   Special Requests   Final    BOTTLES DRAWN AEROBIC AND ANAEROBIC Blood Culture adequate volume   Culture   Final    NO GROWTH < 24 HOURS Performed at [facility], [place] [address], [place], [place] [address]    Report Status PENDING  Incomplete    Radiology Studies: No results found.    Yolani Vo T. [place]  If 7PM-7AM, please contact night-coverage [email or web address] 08/06/2021, 1:27 PM

## 2021-08-06 NOTE — Progress Notes (Signed)
Physical Therapy Treatment Patient Details Name: Charles Lloyd. MRN: [id number] DOB: 1941/02/17 Today's Date: 08/06/2021   History of Present Illness 81 y.o. male presented to the ED 08/04/21 with bilateral lower extremity weakness/numbness after a mechanical fall at home.  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 Neurosurgery consult pending. PMH significant of chronic HFpEF, A-fib on Eliquis, hypertension, hyperlipidemia, type 2 diabetes, bil TKA, history of skin and prostate cancer, gout    PT Comments    Patient sitting on EOB on arrival. Doing better with mobility today as evidenced by his refusal for using RW when up walking and ability to climb 3 steps with minguard assist. Noted pt has refused surgery at this time. Plan is to return home alone with son available prn (pt plans to stay on his first floor and only go upstairs if his son is there to assist him). Anticipate he could ascend a flight of steps with son at this time as he did very well with 3 steps, including leading with either leg and stepping down with either leg.     Recommendations for follow up therapy are one component of a multi-disciplinary discharge planning process, led by the attending physician.  Recommendations may be updated based on patient status, additional functional criteria and insurance authorization.  Follow Up Recommendations  Home health PT     Assistance Recommended at Discharge PRN  Patient can return home with the following Assistance with cooking/housework;Help with stairs or ramp for entrance   Equipment Recommendations  Rolling walker (2 wheels)    Recommendations for Other Services       Precautions / Restrictions Precautions Precautions: Fall Restrictions Weight Bearing Restrictions: No     Mobility  Bed Mobility Overal bed mobility: Modified Independent             General bed mobility comments: sitting at EOB on arrival about to go  to restroom    Transfers Overall transfer level: Needs assistance Equipment used: None Transfers: Sit to/from Stand Sit to Stand: Supervision           General transfer comment: supervision for safety as pt did not want to use RW    Ambulation/Gait Ambulation/Gait assistance: Min guard Gait Distance (Feet): 12 Feet (toileted, 25) Assistive device: None Gait Pattern/deviations: Step-through pattern, Decreased stride length   Gait velocity interpretation: >2.62 ft/sec, indicative of community ambulatory   General Gait Details: pt refused use of RW; slight shuffling to gait (decr foot clearance bil); limited ambulation due to pt wanting to sit and eat his breakfast after assisted to bathroom and completed steps   Stairs Stairs: Yes Stairs assistance: Min guard Stair Management: One rail Right, Step to pattern, Forwards Number of Stairs: 3 General stair comments: performed at bedside with single step and using bed rail to simulate stair rail   Wheelchair Mobility    Modified Rankin (Stroke Patients Only)       Balance Overall balance assessment: Needs assistance Sitting-balance support: No upper extremity supported Sitting balance-Leahy Scale: Normal     Standing balance support: No upper extremity supported Standing balance-Leahy Scale: Fair                              Cognition Arousal/Alertness: Awake/alert Behavior During Therapy: Flat affect Overall Cognitive Status: Within Functional Limits for tasks assessed  Exercises      General Comments General comments (skin integrity, edema, etc.): Son present. Reports he will assist pt going up flight of steps if pt needs to go up to take a shower. Pt plans to sleep on couch on 1st level.      Pertinent Vitals/Pain Pain Assessment Pain Assessment: Faces Faces Pain Scale: Hurts little more Pain Location: back Pain Descriptors /  Indicators: Discomfort Pain Intervention(s): Limited activity within patient's tolerance    Home Living                          Prior Function            PT Goals (current goals can now be found in the care plan section) Acute Rehab PT Goals Patient Stated Goal: return to his baseline; playing golf PT Goal Formulation: With patient Time For Goal Achievement: 08/19/21 Potential to Achieve Goals: Good Progress towards PT goals: Progressing toward goals    Frequency    Min 4X/week      PT Plan Current plan remains appropriate    Co-evaluation              AM-PAC PT "6 Clicks" Mobility   Outcome Measure  Help needed turning from your back to your side while in a flat bed without using bedrails?: None Help needed moving from lying on your back to sitting on the side of a flat bed without using bedrails?: None Help needed moving to and from a bed to a chair (including a wheelchair)?: A Little Help needed standing up from a chair using your arms (e.g., wheelchair or bedside chair)?: A Little Help needed to walk in hospital room?: A Little Help needed climbing 3-5 steps with a railing? : A Little 6 Click Score: 20    End of Session   Activity Tolerance: Patient tolerated treatment well;No increased pain Patient left: in chair;with call bell/phone within reach;with chair alarm set;with family/visitor present Nurse Communication: Mobility status PT Visit Diagnosis: Unsteadiness on feet (R26.81);Difficulty in walking, not elsewhere classified (R26.2)     Time: 5681-2751 PT Time Calculation (min) (ACUTE ONLY): 12 min  Charges:  $Gait Training: 8-22 mins                      [place]  Office [phone]    Rexanne Mano 08/06/2021, 10:52 AM

## 2021-08-06 NOTE — Progress Notes (Signed)
Continues with febrile neutropenia.  On broad-spectrum antibiotics.  Source of infection unknown.  Back pain well controlled.  Lower extremity symptoms minimal at present.  Neurologically intact to direct testing.  Patient with severe spinal stenosis at L2-3 and L4-5.  Current medical situation precludes any thoughts of intervention at present.  Should the patient's medical issues to be sorted then I think the patient could consider epidural steroid injection for management of his symptoms.  If this is unsuccessful he may eventually require decompressive surgery.

## 2021-08-07 DIAGNOSIS — E871 Hypo-osmolality and hyponatremia: Secondary | ICD-10-CM

## 2021-08-07 LAB — COMPREHENSIVE METABOLIC PANEL
ALT: 32 U/L (ref 0–44)
AST: 58 U/L — ABNORMAL HIGH (ref 15–41)
Albumin: 2.4 g/dL — ABNORMAL LOW (ref 3.5–5.0)
Alkaline Phosphatase: 59 U/L (ref 38–126)
Anion gap: 9 (ref 5–15)
BUN: 27 mg/dL — ABNORMAL HIGH (ref 8–23)
CO2: 18 mmol/L — ABNORMAL LOW (ref 22–32)
Calcium: 7.9 mg/dL — ABNORMAL LOW (ref 8.9–10.3)
Chloride: 107 mmol/L (ref 98–111)
Creatinine, Ser: 1.85 mg/dL — ABNORMAL HIGH (ref 0.61–1.24)
GFR, Estimated: 37 mL/min — ABNORMAL LOW (ref >60)
Glucose, Bld: 114 mg/dL — ABNORMAL HIGH (ref 70–99)
Potassium: 3.2 mmol/L — ABNORMAL LOW (ref 3.5–5.1)
Sodium: 134 mmol/L — ABNORMAL LOW (ref 135–145)
Total Bilirubin: 1.1 mg/dL (ref 0.3–1.2)
Total Protein: 4.9 g/dL — ABNORMAL LOW (ref 6.5–8.1)

## 2021-08-07 LAB — CBC WITH DIFFERENTIAL/PLATELET
Abs Immature Granulocytes: 0.02 10*3/uL (ref 0.00–0.07)
Basophils Absolute: 0 10*3/uL (ref 0.0–0.1)
Basophils Relative: 1 %
Eosinophils Absolute: 0 10*3/uL (ref 0.0–0.5)
Eosinophils Relative: 1 %
HCT: 27.4 % — ABNORMAL LOW (ref 39.0–52.0)
Hemoglobin: 8.8 g/dL — ABNORMAL LOW (ref 13.0–17.0)
Immature Granulocytes: 1 %
Lymphocytes Relative: 28 %
Lymphs Abs: 0.9 10*3/uL (ref 0.7–4.0)
MCH: 29.7 pg (ref 26.0–34.0)
MCHC: 32.1 g/dL (ref 30.0–36.0)
MCV: 92.6 fL (ref 80.0–100.0)
Monocytes Absolute: 0.3 10*3/uL (ref 0.1–1.0)
Monocytes Relative: 9 %
Neutro Abs: 1.8 10*3/uL (ref 1.7–7.7)
Neutrophils Relative %: 60 %
Platelets: 82 10*3/uL — ABNORMAL LOW (ref 150–400)
RBC: 2.96 MIL/uL — ABNORMAL LOW (ref 4.22–5.81)
RDW: 15.7 % — ABNORMAL HIGH (ref 11.5–15.5)
WBC: 3 10*3/uL — ABNORMAL LOW (ref 4.0–10.5)
nRBC: 0 % (ref 0.0–0.2)

## 2021-08-07 LAB — GLUCOSE, CAPILLARY
Glucose-Capillary: 110 mg/dL — ABNORMAL HIGH (ref 70–99)
Glucose-Capillary: 153 mg/dL — ABNORMAL HIGH (ref 70–99)
Glucose-Capillary: 121 mg/dL — ABNORMAL HIGH (ref 70–99)
Glucose-Capillary: 118 mg/dL — ABNORMAL HIGH (ref 70–99)
Glucose-Capillary: 123 mg/dL — ABNORMAL HIGH (ref 70–99)

## 2021-08-07 LAB — HEPATITIS PANEL, ACUTE
HCV Ab: NONREACTIVE
Hep A IgM: NONREACTIVE
Hep B C IgM: NONREACTIVE
Hepatitis B Surface Ag: NONREACTIVE

## 2021-08-07 LAB — CK: Total CK: 244 U/L (ref 49–397)

## 2021-08-07 LAB — MAGNESIUM: Magnesium: 2 mg/dL (ref 1.7–2.4)

## 2021-08-07 LAB — PHOSPHORUS: Phosphorus: 3 mg/dL (ref 2.5–4.6)

## 2021-08-07 MED ORDER — POTASSIUM CHLORIDE CRYS ER 20 MEQ PO TBCR
40.0000 meq | EXTENDED_RELEASE_TABLET | Freq: Once | ORAL | Status: AC
Start: 2021-08-07 — End: 2021-08-07
  Administered 2021-08-07: 40 meq via ORAL
  Filled 2021-08-07: qty 2

## 2021-08-07 MED ORDER — CARVEDILOL 6.25 MG PO TABS
6.2500 mg | ORAL_TABLET | Freq: Two times a day (BID) | ORAL | Status: DC
  Administered 2021-08-07 – 2021-08-08 (×2): 6.25 mg via ORAL
  Filled 2021-08-07 (×3): qty 1

## 2021-08-07 MED ORDER — POTASSIUM CHLORIDE CRYS ER 20 MEQ PO TBCR
40.0000 meq | EXTENDED_RELEASE_TABLET | ORAL | Status: AC
  Administered 2021-08-07 (×2): 40 meq via ORAL
  Filled 2021-08-07 (×2): qty 2

## 2021-08-07 MED ORDER — COLCHICINE 0.3 MG HALF TABLET
0.3000 mg | ORAL_TABLET | Freq: Every day | ORAL | Status: DC
  Administered 2021-08-07: 0.3 mg via ORAL
  Filled 2021-08-07: qty 1

## 2021-08-07 NOTE — Plan of Care (Signed)
  Problem: Nutrition: Goal: Adequate nutrition will be maintained 08/07/2021 0043 by Verlan Friends, Nelda Marseille, RN Outcome: Not Progressing 08/07/2021 0011 by Verlan Friends, Nelda Marseille, RN Outcome: Not Progressing   Problem: Clinical Measurements: Goal: Diagnostic test results will improve 08/07/2021 0043 by Verlan Friends, Nelda Marseille, RN Outcome: Not Progressing 08/07/2021 0011 by Verlan Friends, Nelda Marseille, RN Outcome: Not Progressing   Problem: Elimination: Goal: Will not experience complications related to bowel motility 08/07/2021 0043 by Verlan Friends, Nelda Marseille, RN Outcome: Not Progressing 08/07/2021 0011 by Verlan Friends, Nelda Marseille, RN Outcome: Not Progressing   Problem: Skin Integrity: Goal: Risk for impaired skin integrity will decrease 08/07/2021 0043 by Monico Hoar, RN Outcome: Not Progressing 08/07/2021 0011 by Verlan Friends, Nelda Marseille, RN Outcome: Not Progressing

## 2021-08-07 NOTE — Progress Notes (Signed)
PT Cancellation Note  Patient Details Name: Charles Lloyd. MRN: [id number] DOB: 29-Apr-1940   Cancelled Treatment:    Reason Eval/Treat Not Completed: (P)  (attempted in afternoon, pt was in process of transfer from 5W to 2W, did not have time to reattempt.) Will continue efforts next date per PT plan of care as schedule permits.   Kara Pacer Wendell Fiebig 08/07/2021, 6:25 PM

## 2021-08-07 NOTE — Progress Notes (Signed)
PROGRESS NOTE  [name] GQB:[id number] DOB: 1940/03/28   PCP: Isaac Bliss, [name], MD  Patient is from: Home.  Lives alone.  Ambulates independently at baseline.  DOA: 08/04/2021 LOS: 2  Chief complaints Chief Complaint  Patient presents with   Fall     Brief Narrative / Interim history: 81 year old M with PMH of HFpEF, A-fib on Eliquis, DM-2, HTN, HLD, prostate cancer, gout, chronic back pain with radiculopathy presenting with bilateral lower extremity weakness and numbness below his knees after accidental fall at home.  Reportedly, he fell while trying to put on his pants but did not hit his head or LOC.  No bowel or bladder habit change.  In ED, febrile to 102.4.  WBC 3.0.  Platelets 95 (acute).  UA and CXR without significant finding.  MRI lumbar spine showed unchanged markedly severe spinal stenosis at L4-5 and stable to slight progression of severe spinal stenosis and moderate neuroforaminal stenosis at L2-3.  Neurosurgery consulted.  Cultures drawn.  Patient was started on IV fluid and broad-spectrum antibiotics, and admitted.  The next day, evaluated by neurosurgery, and decided to try epidural steroid injection outpatient before decompressive surgery.  Blood and urine cultures NGTD.  ID consulted, and stopped BSA and started doxycycline.  Lyme and RMSF serology pending.  Subjective: Seen and examined earlier this morning.  No major events overnight of this morning.  He spiked another fever to 100.8 about 4 AM this morning.  Reports improvement in his diarrhea.  He says his stool is formed.  No complaints other than prolonged hospital stay.  He denies chest pain, shortness of breath, cough, nausea, vomiting, abdominal pain or UTI symptoms.  Denies focal pain.   Objective: Vitals:   08/07/21 0618 08/07/21 0848 08/07/21 1227 08/07/21 1549  BP:  90/79 113/65 (!) 154/71  Pulse:  72 79 66  Resp:  17 18   Temp: 98.8 F (37.1 C) 98.5 F (36.9 C)  98 F (36.7 C)   TempSrc:  Oral  Oral  SpO2:    100%  Weight:      Height:        Examination  GENERAL: No apparent distress.  Nontoxic. HEENT: MMM.  Vision and hearing grossly intact.  NECK: Supple.  No apparent JVD.  RESP:  No IWOB.  Fair aeration bilaterally. CVS:  RRR. Heart sounds normal.  ABD/GI/GU: BS+. Abd soft, NTND.  MSK/EXT:  Moves extremities. No apparent deformity. No edema.  SKIN: no apparent skin lesion or wound NEURO: Awake and alert. Oriented appropriately.  No apparent focal neuro deficit. PSYCH: Calm. Normal affect.   Procedures:  None Microbiology summarized: COVID-19 PCR nonreactive. Blood cultures NGTD Urine culture NGTD. RVP pending.  Assessment and plan: Principal Problem:   Spinal stenosis Active Problems:   SIRS (systemic inflammatory response syndrome) (HCC)   Pancytopenia (HCC)   Paroxysmal A-fib (HCC)   Chronic diastolic (congestive) heart failure (HCC)   Neoplasm of prostate, malignant (HCC)   NIDDM-2 with polyneuropathy and hyperglycemia   Hyponatremia   Asthma   Essential hypertension   Chronic kidney disease, stage 3b (HCC)   Thrombocytopenia (HCC)   Hyperglycemia   Fall at home, initial encounter   Paresthesia of lower extremity   Lower extremity weakness   FUO (fever of unknown origin)   Hypokalemia   Hypomagnesemia  Accidental fall at home-no prodromes.  Denies hitting his head or loss of consciousness. Lumbar spinal stenosis/bilateral LE weakness and paresthesia -MRI lumbar spine as above.  No  bowel or bladder habit change. Low suspicion for infection in this area. -Evaluated by neurosurgery-plan for epidural steroid injection outpatient before decompressive surgery -PT/OT eval  SIRS/FUO: Fever curve downtrending.  Leukopenia and thrombocytopenia stable.  No clear source of infection yet.  Infectious work-up including blood cultures, urine culture, RVP, acute hepatitis panel and COVID-19 PCR nonreactive.  Had diarrhea which seems to have  improved.  Abdominal exam benign.   Inflammatory markers slightly elevated.  He was recently treated for gout flareup.  His uric acid is only 7.0.  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  He has history of prostate cancer in remission after surgery and chemo in 2002.  Doubt VTE while he is on anticoagulation. -Infectious disease consulted-discontinued BSA and started doxycycline. -Follow Lyme and RMSF serologies  Pancytopenia: Leukopenia and thrombocytopenia seems acute but stable. Baseline Hgb about 10.  Anemia panel suggests mild iron deficiency.     Latest Ref Rng & Units 08/07/2021    1:58 AM 08/06/2021    1:50 AM 08/05/2021    4:37 AM  CBC  WBC 4.0 - 10.5 K/uL 3.0  2.9  3.0   Hemoglobin 13.0 - 17.0 g/dL 8.8  8.7  8.8   Hematocrit 39.0 - 52.0 % 27.4  26.3  26.9   Platelets 150 - 400 K/uL 82  83  84   -Continue monitoring -IV iron prior to discharge.  CKD-3B: Cr slightly up after starting torsemide.  Improved after holding torsemide Recent Labs    12/14/20 1101 12/30/20 1613 12/31/20 0429 01/01/21 0650 01/06/21 0950 06/23/21 1351 08/04/21 1030 08/05/21 0437 08/06/21 0150 08/07/21 0158  BUN 19 25* '20 10 16 '$ 39* 22 21 25* 27*  CREATININE 1.44 2.60* 1.98* 1.29* 1.24 1.61* 1.70* 1.64* 1.97* 1.85*  -Continue holding torsemide -Continue monitoring  Diet controlled DM-2 with hyperglycemia and polyneuropathy: A1c 6.9%, was 5.8% on 3/16.  Does not seem to be on medication. Recent Labs  Lab 08/06/21 2113 08/07/21 0615 08/07/21 0850 08/07/21 1229 08/07/21 1630  GLUCAP 120* 110* 153* 121* 118*  -Continue SSI-increase to sensitive.  Chronic diastolic CHF/cardiomyopathy: TTE in 2020 with LVEF of 50 to 55% and no other significant finding.  Seems to be on torsemide, Coreg and Diovan at home. -Hold home medications. -Monitor intake and output, renal functions and electrolytes  Paroxysmal A-fib: Seems to be in sinus rhythm with first-degree AVB.  Followed by EP.  On  Coreg, flecainide and Eliquis at home. -Continue home medication and telemetry monitoring -Optimize electrolytes  Chronic COPD/asthma: Stable.  No respiratory symptoms. -Continue home Trelegy Ellipta with as needed nebs.  History of prostate cancer: In remission since surgery and chemotherapy in 2002 per patient's son. -Outpatient follow-up  History of gout with recent flare: treated with Sterapred and colchicine.  Uric acid 7.0. -Holding colchicine in the setting of diarrhea. -Continue home allopurinol.  Hypokalemia/hypomagnesemia -Monitor replenish as appropriate.  Hyponatremia: Mild. -Monitor.  Body mass index is 27.68 kg/m.         DVT prophylaxis:  SCDs Start: 08/04/21 2344 apixaban (ELIQUIS) tablet 5 mg  Code Status: Full code Family Communication: Updated patient's son over the phone. Level of care: Med-Surg Status is: Inpatient The patient will remain inpatient because: SIRS/FUO and pancytopenia   Final disposition: Home with home health. Consultants:  Neurosurgery Infectious disease  Sch Meds:  Scheduled Meds:  allopurinol  300 mg Oral Daily   apixaban  5 mg Oral BID   atorvastatin  20 mg Oral  Daily   carvedilol  6.25 mg Oral BID   cycloSPORINE  1 drop Both Eyes BID   flecainide  100 mg Oral BID   fluticasone furoate-vilanterol  1 puff Inhalation Daily   gabapentin  600 mg Oral QHS   insulin aspart  0-6 Units Subcutaneous TID WC   montelukast  10 mg Oral QHS   Continuous Infusions:  doxycycline (VIBRAMYCIN) IV 100 mg (08/07/21 1000)   PRN Meds:.acetaminophen **OR** acetaminophen, albuterol, melatonin  Antimicrobials: Anti-infectives (From admission, onward)    Start     Dose/Rate Route Frequency Ordered Stop   08/06/21 1045  doxycycline (VIBRAMYCIN) 100 mg in sodium chloride 0.9 % 250 mL IVPB        100 mg 125 mL/hr over 120 Minutes Intravenous Every 12 hours 08/06/21 0947     08/06/21 0400  vancomycin (VANCOCIN) IVPB 1000 mg/200 mL premix   Status:  Discontinued        1,000 mg 200 mL/hr over 60 Minutes Intravenous Every 24 hours 08/05/21 1038 08/06/21 1103   08/05/21 0030  ceFEPIme (MAXIPIME) 2 g in sodium chloride 0.9 % 100 mL IVPB  Status:  Discontinued        2 g 200 mL/hr over 30 Minutes Intravenous 2 times daily 08/05/21 0029 08/06/21 1103   08/05/21 0030  vancomycin (VANCOREADY) IVPB 1500 mg/300 mL        1,500 mg 150 mL/hr over 120 Minutes Intravenous  Once 08/05/21 0029 08/05/21 0557   08/05/21 0000  metroNIDAZOLE (FLAGYL) IVPB 500 mg  Status:  Discontinued        500 mg 100 mL/hr over 60 Minutes Intravenous Every 12 hours 08/04/21 2347 08/06/21 1103        I have personally reviewed the following labs and images: CBC: Recent Labs  Lab 08/04/21 1030 08/05/21 0437 08/06/21 0150 08/07/21 0158  WBC 3.0* 3.0* 2.9* 3.0*  NEUTROABS 2.3  --  2.1 1.8  HGB 10.6* 8.8* 8.7* 8.8*  HCT 32.8* 26.9* 26.3* 27.4*  MCV 93.7 91.5 92.0 92.6  PLT 95* 84* 83* 82*   BMP &GFR Recent Labs  Lab 08/04/21 1030 08/05/21 0437 08/06/21 0150 08/07/21 0158  NA 134* 135 135 134*  K 3.6 3.6 3.2* 3.2*  CL 104 107 106 107  CO2 25 20* 20* 18*  GLUCOSE 180* 140* 143* 114*  BUN 22 21 25* 27*  CREATININE 1.70* 1.64* 1.97* 1.85*  CALCIUM 8.1* 7.7* 7.7* 7.9*  MG  --   --  1.7 2.0  PHOS  --   --  3.6 3.0   Estimated Creatinine Clearance: 34 mL/min (A) (by C-G formula based on SCr of 1.85 mg/dL (H)). Liver & Pancreas: Recent Labs  Lab 08/04/21 1030 08/06/21 0150 08/06/21 1122 08/07/21 0158  AST 45*  --  75* 58*  ALT 24  --  33 32  ALKPHOS 85  --  68 59  BILITOT 1.0  --  1.2 1.1  PROT 6.1*  --  5.3* 4.9*  ALBUMIN 3.1* 2.4* 2.6* 2.4*   No results for input(s): "LIPASE", "AMYLASE" in the last 168 hours. No results for input(s): "AMMONIA" in the last 168 hours. Diabetic: Recent Labs    08/05/21 0447  HGBA1C 6.9*   Recent Labs  Lab 08/06/21 2113 08/07/21 0615 08/07/21 0850 08/07/21 1229 08/07/21 1630  GLUCAP 120*  110* 153* 121* 118*   Cardiac Enzymes: Recent Labs  Lab 08/07/21 0158  CKTOTAL 244   No results for input(s): "PROBNP" in the last  8760 hours. Coagulation Profile: Recent Labs  Lab 08/04/21 1030  INR 1.7*   Thyroid Function Tests: No results for input(s): "TSH", "T4TOTAL", "FREET4", "T3FREE", "THYROIDAB" in the last 72 hours. Lipid Profile: No results for input(s): "CHOL", "HDL", "LDLCALC", "TRIG", "CHOLHDL", "LDLDIRECT" in the last 72 hours. Anemia Panel: Recent Labs    08/06/21 0150  VITAMINB12 229  FOLATE >40.0  FERRITIN 57  TIBC 280  IRON 24*  RETICCTPCT 1.8   Urine analysis:    Component Value Date/Time   COLORURINE YELLOW 08/04/2021 1611   APPEARANCEUR CLEAR 08/04/2021 1611   LABSPEC 1.014 08/04/2021 1611   PHURINE 5.0 08/04/2021 1611   GLUCOSEU NEGATIVE 08/04/2021 1611   HGBUR NEGATIVE 08/04/2021 1611   BILIRUBINUR NEGATIVE 08/04/2021 1611   KETONESUR NEGATIVE 08/04/2021 1611   PROTEINUR 30 (A) 08/04/2021 1611   NITRITE NEGATIVE 08/04/2021 1611   LEUKOCYTESUR NEGATIVE 08/04/2021 1611   Sepsis Labs: Invalid input(s): "PROCALCITONIN", "LACTICIDVEN"  Microbiology: Recent Results (from the past 240 hour(s))  Urine Culture     Status: None   Collection Time: 08/04/21 10:10 AM   Specimen: In/Out Cath Urine  Result Value Ref Range Status   Specimen Description IN/OUT CATH URINE  Final   Special Requests NONE  Final   Culture   Final    NO GROWTH Performed at [facility], [address] [place], [place] [address]    Report Status 08/06/2021 FINAL  Final  SARS Coronavirus 2 by RT PCR (hospital order, performed in [facility] hospital lab) *cepheid single result test* Anterior Nasal Swab     Status: None   Collection Time: 08/04/21 10:10 AM   Specimen: Anterior Nasal Swab  Result Value Ref Range Status   SARS Coronavirus 2 by RT PCR NEGATIVE NEGATIVE Final    Comment: (NOTE) SARS-CoV-2 target nucleic acids are NOT DETECTED.  The SARS-CoV-2 RNA  is generally detectable in upper and lower respiratory specimens during the acute phase of infection. The lowest concentration of SARS-CoV-2 viral copies this assay can detect is 250 copies / mL. A negative result does not preclude SARS-CoV-2 infection and should not be used as the sole basis for treatment or other patient management decisions.  A negative result may occur with improper specimen collection / handling, submission of specimen other than nasopharyngeal swab, presence of viral mutation(s) within the areas targeted by this assay, and inadequate number of viral copies (<250 copies / mL). A negative result must be combined with clinical observations, patient history, and epidemiological information.  Fact Sheet for Patients:   https://www.patel.info/  Fact Sheet for Healthcare Providers: https://hall.com/  This test is not yet approved or  cleared by the [place] FDA and has been authorized for detection and/or diagnosis of SARS-CoV-2 by FDA under an Emergency Use Authorization (EUA).  This EUA will remain in effect (meaning this test can be used) for the duration of the COVID-19 declaration under Section 564(b)(1) of the Act, 21 U.S.C. section 360bbb-3(b)(1), unless the authorization is terminated or revoked sooner.  Performed at [facility], [place] [address], [place], [place] [address]   Blood Culture (routine x 2)     Status: None (Preliminary result)   Collection Time: 08/04/21 10:30 AM   Specimen: BLOOD  Result Value Ref Range Status   Specimen Description BLOOD BLOOD LEFT HAND  Final   Special Requests   Final    BOTTLES DRAWN AEROBIC AND ANAEROBIC Blood Culture results may not be optimal due to an inadequate volume  of blood received in culture bottles   Culture   Final    NO GROWTH 3 DAYS Performed at [facility], [place] [address], [place], [place] [address]    Report Status PENDING  Incomplete  Blood  Culture (routine x 2)     Status: None (Preliminary result)   Collection Time: 08/04/21 10:40 AM   Specimen: BLOOD  Result Value Ref Range Status   Specimen Description BLOOD RIGHT ANTECUBITAL  Final   Special Requests   Final    BOTTLES DRAWN AEROBIC AND ANAEROBIC Blood Culture adequate volume   Culture   Final    NO GROWTH 3 DAYS Performed at [facility], [place] [address], [place], [place] [address]    Report Status PENDING  Incomplete  Respiratory (~20 pathogens) panel by PCR     Status: None   Collection Time: 08/06/21  9:43 AM   Specimen: Nasopharyngeal Swab; Respiratory  Result Value Ref Range Status   Adenovirus NOT DETECTED NOT DETECTED Final   Coronavirus 229E NOT DETECTED NOT DETECTED Final    Comment: (NOTE) The Coronavirus on the Respiratory Panel, DOES NOT test for the novel  Coronavirus (2019 nCoV)    Coronavirus HKU1 NOT DETECTED NOT DETECTED Final   Coronavirus NL63 NOT DETECTED NOT DETECTED Final   Coronavirus OC43 NOT DETECTED NOT DETECTED Final   Metapneumovirus NOT DETECTED NOT DETECTED Final   Rhinovirus / Enterovirus NOT DETECTED NOT DETECTED Final   Influenza A NOT DETECTED NOT DETECTED Final   Influenza B NOT DETECTED NOT DETECTED Final   Parainfluenza Virus 1 NOT DETECTED NOT DETECTED Final   Parainfluenza Virus 2 NOT DETECTED NOT DETECTED Final   Parainfluenza Virus 3 NOT DETECTED NOT DETECTED Final   Parainfluenza Virus 4 NOT DETECTED NOT DETECTED Final   Respiratory Syncytial Virus NOT DETECTED NOT DETECTED Final   Bordetella pertussis NOT DETECTED NOT DETECTED Final   Bordetella Parapertussis NOT DETECTED NOT DETECTED Final   Chlamydophila pneumoniae NOT DETECTED NOT DETECTED Final   Mycoplasma pneumoniae NOT DETECTED NOT DETECTED Final    Comment: Performed at [facility], [place], [place], [place] [address]    Radiology Studies: No results found.    Lockie Bothun T. [place]  If 7PM-7AM, please contact  night-coverage [email or web address] 08/07/2021, 4:53 PM

## 2021-08-07 NOTE — Plan of Care (Signed)
  Problem: Clinical Measurements: Goal: Respiratory complications will improve Outcome: Not Progressing   Problem: Activity: Goal: Risk for activity intolerance will decrease Outcome: Not Progressing   Problem: Elimination: Goal: Will not experience complications related to bowel motility Outcome: Not Progressing

## 2021-08-08 ENCOUNTER — Telehealth: Payer: Self-pay

## 2021-08-08 DIAGNOSIS — M4800 Spinal stenosis, site unspecified: Secondary | ICD-10-CM

## 2021-08-08 DIAGNOSIS — R197 Diarrhea, unspecified: Secondary | ICD-10-CM

## 2021-08-08 DIAGNOSIS — D72819 Decreased white blood cell count, unspecified: Secondary | ICD-10-CM

## 2021-08-08 LAB — CBC
HCT: 26.7 % — ABNORMAL LOW (ref 39.0–52.0)
Hemoglobin: 8.8 g/dL — ABNORMAL LOW (ref 13.0–17.0)
MCH: 30.1 pg (ref 26.0–34.0)
MCHC: 33 g/dL (ref 30.0–36.0)
MCV: 91.4 fL (ref 80.0–100.0)
Platelets: 98 10*3/uL — ABNORMAL LOW (ref 150–400)
RBC: 2.92 MIL/uL — ABNORMAL LOW (ref 4.22–5.81)
RDW: 16.1 % — ABNORMAL HIGH (ref 11.5–15.5)
WBC: 3.1 10*3/uL — ABNORMAL LOW (ref 4.0–10.5)
nRBC: 0 % (ref 0.0–0.2)

## 2021-08-08 LAB — RENAL FUNCTION PANEL
Albumin: 2.3 g/dL — ABNORMAL LOW (ref 3.5–5.0)
Anion gap: 9 (ref 5–15)
BUN: 25 mg/dL — ABNORMAL HIGH (ref 8–23)
CO2: 16 mmol/L — ABNORMAL LOW (ref 22–32)
Calcium: 8.4 mg/dL — ABNORMAL LOW (ref 8.9–10.3)
Chloride: 111 mmol/L (ref 98–111)
Creatinine, Ser: 1.57 mg/dL — ABNORMAL HIGH (ref 0.61–1.24)
GFR, Estimated: 45 mL/min — ABNORMAL LOW (ref >60)
Glucose, Bld: 104 mg/dL — ABNORMAL HIGH (ref 70–99)
Phosphorus: 2.5 mg/dL (ref 2.5–4.6)
Potassium: 4 mmol/L (ref 3.5–5.1)
Sodium: 136 mmol/L (ref 135–145)

## 2021-08-08 LAB — MAGNESIUM: Magnesium: 2 mg/dL (ref 1.7–2.4)

## 2021-08-08 LAB — GLUCOSE, CAPILLARY: Glucose-Capillary: 99 mg/dL (ref 70–99)

## 2021-08-08 MED ORDER — SODIUM BICARBONATE 650 MG PO TABS
650.0000 mg | ORAL_TABLET | Freq: Three times a day (TID) | ORAL | Status: DC
  Administered 2021-08-08: 650 mg via ORAL
  Filled 2021-08-08: qty 1

## 2021-08-08 MED ORDER — VALSARTAN 160 MG PO TABS: 160.0000 mg | ORAL_TABLET | Freq: Every day | ORAL | 0 refills | Status: DC

## 2021-08-08 MED ORDER — KLOR-CON M20 20 MEQ PO TBCR: 20.0000 meq | EXTENDED_RELEASE_TABLET | Freq: Every day | ORAL | Status: DC

## 2021-08-08 MED ORDER — DOXYCYCLINE HYCLATE 100 MG PO CAPS: 100.0000 mg | ORAL_CAPSULE | Freq: Two times a day (BID) | ORAL | 0 refills | Status: DC

## 2021-08-08 MED ORDER — TORSEMIDE 20 MG PO TABS: 20.0000 mg | ORAL_TABLET | Freq: Every day | ORAL | Status: DC

## 2021-08-08 MED ORDER — SODIUM BICARBONATE 650 MG PO TABS
650.0000 mg | ORAL_TABLET | Freq: Three times a day (TID) | ORAL | 0 refills | Status: DC
Start: 2021-08-08 — End: 2021-08-16

## 2021-08-08 NOTE — Telephone Encounter (Signed)
Transition Care Management Unsuccessful Follow-up Telephone Call  Date of discharge and from where:  08/08/21 from [facility]  Attempts:  1st Attempt  Reason for unsuccessful TCM follow-up call:  Left voice message  Please transfer to [facility] if patient calls back.

## 2021-08-08 NOTE — Progress Notes (Signed)
Patient discharged to home with instructions. 

## 2021-08-08 NOTE — Discharge Summary (Signed)
Physician Discharge Summary  [name] JJK:[id number] DOB: 1940-11-27 DOA: 08/04/2021  PCP: Isaac Bliss, [name], MD  Admit date: 08/04/2021 Discharge date: 08/08/2021 Admitted From: Home Disposition: Home Recommendations for Outpatient Follow-up:  Follow up with PCP in 1 to 2 weeks Neurosurgery to arrange outpatient follow-up Please obtain CBC with differential and BMP at follow-up. Please follow up on the following pending results: Lyme and RMSF titers  Home Health: PT/OT Equipment/Devices: Rolling walker  Discharge Condition: Stable CODE STATUS: Full code  Follow-up Information     Isaac Bliss, [name], MD. Schedule an appointment as soon as possible for a visit in 1 week(s).   Specialty: Internal Medicine Contact information: [place] [address] [phone]         [facility] Follow up.   Specialty: Home Health Services Why: [name] will call you to schedule home health including PT/OT services in the home. Contact information: [address] [place] [address] [phone]         [facility] Follow up.   Why: Rolling walker will be provided through [facility]. Contact information: [place] [address] [phone]                 Hospital course 81 year old M with PMH of HFpEF, A-fib on Eliquis, DM-2, HTN, HLD, prostate cancer, gout, chronic back pain with radiculopathy presenting with bilateral lower extremity weakness and numbness below his knees after accidental fall at home.  Reportedly, he fell while trying to put on his pants but did not hit his head or LOC.  No bowel or bladder habit change.   In ED, febrile to 102.4.  WBC 3.0.  Platelets 95 (acute).  UA and CXR without significant finding.  MRI lumbar spine showed unchanged markedly severe spinal stenosis at L4-5 and stable to slight progression of severe spinal stenosis and moderate  neuroforaminal stenosis at L2-3.  Neurosurgery consulted.  Cultures drawn.  Patient was started on IV fluid and broad-spectrum antibiotics, and admitted.   The next day, evaluated by neurosurgery, and decided to try epidural steroid injection outpatient before decompressive surgery.    In regards to fever, blood and urine cultures, RVP and COVID-19 PCR negative.  He had few episodes of diarrhea that has resolved quickly.  We were not able to obtain stool sample for C. difficile testing or GIP.  ID consulted, and stopped BSA and started doxycycline.  Acute hepatitis panel negative.  Leukopenia and thrombocytopenia improved some.  He felt well to go home and complete antibiotic course.  Cleared for discharge by infectious disease on p.o. doxycycline for a total of 10 days.  Lyme and RMSF serology pending at time of discharge.  May consider autoimmune work-up or referral to hematology if pancytopenia does not improve.  See individual problem list below for more.   Problems addressed during this hospitalization Principal Problem:   Spinal stenosis Active Problems:   SIRS (systemic inflammatory response syndrome) (HCC)   Pancytopenia (HCC)   Paroxysmal A-fib (HCC)   Chronic diastolic (congestive) heart failure (HCC)   Neoplasm of prostate, malignant (HCC)   NIDDM-2 with polyneuropathy and hyperglycemia   Hyponatremia   Asthma   Essential hypertension   Chronic kidney disease, stage 3b (HCC)   Thrombocytopenia ([place])   Hyperglycemia   Fall at home, initial encounter   Paresthesia of lower extremity   Lower extremity weakness   FUO (fever of  unknown origin)   Hypokalemia   Hypomagnesemia   Diarrhea   Accidental fall at home-no prodromes.  Denies hitting his head or loss of consciousness. Lumbar spinal stenosis/bilateral LE weakness and paresthesia -MRI lumbar spine as above.  No bowel or bladder habit change. Low suspicion for infection in this area. -Symptoms basically  resolved. -Evaluated by neurosurgery-plan for epidural steroid injection outpatient before decompressive surgery -HH PT/OT and rolling walker ordered.   SIRS/FUO: Feels well.  Fever curve downtrending and he was afebrile for 24 hours prior to discharge.  Some improvement in pancytopenia as well.  Infectious work-up unrevealing.  Lyme and RMSF titer pending at time of discharge -Discharged on p.o. doxycycline for 7 more days to complete 10 days course -Recheck CBC with differential at follow-up -Consider autoimmune work-up or referral to hematology   Pancytopenia: Leukopenia and thrombocytopenia seems acute but stable. Baseline Hgb about 10.  Anemia panel suggests mild iron deficiency.  Recent Labs  Lab 08/04/21 1030 08/05/21 0437 08/06/21 0150 08/07/21 0158 08/08/21 0423  WBC 3.0* 3.0* 2.9* 3.0* 3.1*  NEUTROABS 2.3  --  2.1 1.8  --   HGB 10.6* 8.8* 8.7* 8.8* 8.8*  HCT 32.8* 26.9* 26.3* 27.4* 26.7*  MCV 93.7 91.5 92.0 92.6 91.4  PLT 95* 84* 83* 82* 98*   -Recheck CBC with differential in 1 week   CKD-3B: Cr slightly up after starting torsemide.  Improved after holding torsemide Recent Labs    12/30/20 1613 12/31/20 0429 01/01/21 0650 01/06/21 0950 06/23/21 1351 08/04/21 1030 08/05/21 0437 08/06/21 0150 08/07/21 0158 08/08/21 0423  BUN 25* '20 10 16 '$ 39* 22 21 25* 27* 25*  CREATININE 2.60* 1.98* 1.29* 1.24 1.61* 1.70* 1.64* 1.97* 1.85* 1.57*  -Advised to hold torsemide and Diovan until follow-up. -Recheck renal function in 1 week   Diet controlled DM-2 with hyperglycemia and polyneuropathy: A1c 6.9%, was 5.8% on 3/16.  Does not seem to be on medication.   Chronic diastolic CHF/cardiomyopathy: TTE in 2020 with LVEF of 50 to 55% and no other significant finding.  Seems to be on torsemide, Coreg and Diovan at home. -Continue Coreg. -Advised to hold torsemide and Diovan until follow-up.   Paroxysmal A-fib: Seems to be in sinus rhythm with first-degree AVB.  Followed by EP.   On Coreg, flecainide and Eliquis at home. -Continue home medications   Chronic COPD/asthma: Stable.  No respiratory symptoms. -Continue home medications.   History of prostate cancer: In remission since surgery and chemotherapy in 2002 per patient's son. -Outpatient follow-up   History of gout with recent flare: treated with Sterapred and colchicine.  Uric acid 7.0. -Holding colchicine in the setting of diarrhea. -Continue home allopurinol.  Diarrhea: Brief episode while on broad-spectrum antibiotics.  Resolved quickly.  Was not able to give samples for C. difficile for GIP.  Abdominal exam benign.   Hypokalemia/hypomagnesemia/hyponatremia: Resolved   Vital signs Vitals:   08/07/21 1227 08/07/21 1549 08/07/21 2111 08/08/21 0500  BP: 113/65 (!) 154/71 (!) 142/67   Pulse: 79 66 74   Temp:  98 F (36.7 C) 99.1 F (37.3 C)   Resp: 18  18   Height:      Weight:    86.9 kg  SpO2:  100% 97%   TempSrc:  Oral Oral   BMI (Calculated):    27.49     Discharge exam  GENERAL: No apparent distress.  Nontoxic. HEENT: MMM.  Vision and hearing grossly intact.  NECK: Supple.  No apparent JVD.  RESP:  No IWOB.  Fair aeration bilaterally. CVS:  RRR. Heart sounds normal.  ABD/GI/GU: BS+. Abd soft, NTND.  MSK/EXT:  Moves extremities. No apparent deformity. No edema.  SKIN: no apparent skin lesion or wound NEURO: Awake and alert. Oriented appropriately.  No apparent focal neuro deficit. PSYCH: Calm. Normal affect.   Discharge Instructions Discharge Instructions     Call MD for:  difficulty breathing, headache or visual disturbances   Complete by: As directed    Call MD for:  extreme fatigue   Complete by: As directed    Call MD for:  persistant dizziness or light-headedness   Complete by: As directed    Call MD for:  severe uncontrolled pain   Complete by: As directed    Call MD for:  temperature >100.4   Complete by: As directed    Diet - low sodium heart healthy   Complete by: As  directed    Discharge instructions   Complete by: As directed    It has been a pleasure taking care of you!  You were hospitalized due to leg weakness, numbness and fall likely from severe spinal and foraminal stenosis in your back.  Follow-up with neurosurgery for further evaluation and management.  You also had a fever and low blood counts.  It is unclear what caused these after the tests we have done but you have fever seems to have subsided.  We are discharging you on oral doxycycline for 7 more days to complete treatment course per recommendation by infectious disease.  We also recommend holding the losartan and torsemide for 1 week.  Follow-up with your primary care doctor in 1 week to have your blood counts rechecked.  Please review your new medication list and the directions on your medications before you take them.  Keep yourself hydrated   Take care,   Increase activity slowly   Complete by: As directed    No wound care   Complete by: As directed       Allergies as of 08/08/2021   No Known Allergies      Medication List     STOP taking these medications    predniSONE 10 MG (21) Tbpk tablet Commonly known as: STERAPRED UNI-PAK 21 TAB   saccharomyces boulardii 250 MG capsule Commonly known as: Florastor       TAKE these medications    Accu-Chek Aviva Plus test strip Generic drug: glucose blood 1 each by Other route daily. Dx E11.9   albuterol 108 (90 Base) MCG/ACT inhaler Commonly known as: VENTOLIN HFA Inhale 1-2 puffs into the lungs every 6 (six) hours as needed for wheezing or shortness of breath.   allopurinol 300 MG tablet Commonly known as: ZYLOPRIM Take 1 tablet (300 mg total) by mouth daily.   atorvastatin 20 MG tablet Commonly known as: LIPITOR TAKE 1 TABLET DAILY   carvedilol 12.5 MG tablet Commonly known as: COREG Take 1 tablet (12.5 mg total) by mouth 2 (two) times daily. What changed: when to take this   colchicine 0.6 MG tablet Take 1  tablet (0.6 mg total) by mouth 2 (two) times daily as needed.   doxycycline 100 MG capsule Commonly known as: VIBRAMYCIN Take 1 capsule (100 mg total) by mouth 2 (two) times daily for 7 days.   Eliquis 5 MG Tabs tablet Generic drug: apixaban TAKE 1 TABLET TWICE A DAY What changed: how much to take   flecainide 100 MG tablet Commonly known as: TAMBOCOR Take 1 tablet (100 mg total)  by mouth 2 (two) times daily.   fluticasone 50 MCG/ACT nasal spray Commonly known as: FLONASE Place 2 sprays into both nostrils daily. What changed:  when to take this reasons to take this   fluticasone furoate-vilanterol 100-25 MCG/INH Aepb Commonly known as: Breo Ellipta Inhale 1 puff into the lungs daily. What changed:  when to take this reasons to take this   gabapentin 300 MG capsule Commonly known as: NEURONTIN TAKE 2 CAPSULES BY MOUTH AT BEDTIME   Klor-Con M20 20 MEQ tablet Generic drug: potassium chloride SA Take 1 tablet (20 mEq total) by mouth daily. Start taking on: August 15, 2021 What changed: These instructions start on August 15, 2021. If you are unsure what to do until then, ask your doctor or other care provider.   montelukast 10 MG tablet Commonly known as: SINGULAIR Take 10 mg by mouth at bedtime.   Restasis 0.05 % ophthalmic emulsion Generic drug: cycloSPORINE Place 1 drop into both eyes 2 (two) times daily.   sodium bicarbonate 650 MG tablet Take 1 tablet (650 mg total) by mouth 3 (three) times daily for 15 doses.   torsemide 20 MG tablet Commonly known as: Demadex Take 1 tablet (20 mg total) by mouth daily. Start taking on: August 15, 2021 What changed: These instructions start on August 15, 2021. If you are unsure what to do until then, ask your doctor or other care provider.   triamcinolone cream 0.1 % Commonly known as: KENALOG Apply 1 application. topically 2 (two) times daily. What changed:  when to take this reasons to take this   valsartan 160 MG  tablet Commonly known as: DIOVAN Take 1 tablet (160 mg total) by mouth daily. Patient must keep April 2023 appointment for further refills. 3 rd/ final attempt Start taking on: August 15, 2021 What changed: These instructions start on August 15, 2021. If you are unsure what to do until then, ask your doctor or other care provider.        Consultations: Infectious disease  Procedures/Studies:   MR LUMBAR SPINE WO CONTRAST  Result Date: 08/04/2021 CLINICAL DATA:  Lumbar spinal stenosis. Fall. Bilateral lower extremity pain and weakness. EXAM: MRI LUMBAR SPINE WITHOUT CONTRAST TECHNIQUE: Multiplanar, multisequence MR imaging of the lumbar spine was performed. No intravenous contrast was administered. 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 Alignment: Unchanged grade 1 retrolisthesis of L2 on L3 and L5 on S1 and grade 1 anterolisthesis of L4 on L5. Vertebrae: No fracture or suspicious marrow lesion. Mild degenerative endplate edema at H8-2 and L2-3. Conus medullaris and cauda equina: Conus extends to the L1-2 level and is normal in signal. Redundancy of the cauda equina related to high-grade spinal stenosis. Paraspinal and other soft tissues: Unremarkable. Disc levels: Disc desiccation and disc space narrowing throughout the lumbar spine with disc narrowing being most severe at L1-2, L2-3, and L5-S1. T12-L1: Negative. L1-2: Disc bulging, endplate spurring, and moderate facet hypertrophy result in borderline to mild bilateral neural foraminal stenosis without spinal stenosis, unchanged. L2-3: Circumferential disc bulging, prominent dorsal epidural fat, and moderate to severe facet and ligamentum flavum hypertrophy result in severe spinal stenosis and moderate bilateral neural foraminal stenosis, stable to slightly progressed. L3-4: Circumferential disc bulging, prominent dorsal epidural fat, and moderate to severe  facet and ligamentum flavum hypertrophy result in moderate spinal stenosis and mild-to-moderate bilateral neural foraminal stenosis, slightly progressed. Bilateral facet joint effusions. L4-5: Anterolisthesis with bulging uncovered disc and severe  facet and ligamentum flavum hypertrophy result in severe spinal stenosis (3 mm AP spinal canal diameter) and mild bilateral neural foraminal stenosis, unchanged. Moderate bilateral facet joint effusions. L5-S1: Disc bulging and endplate spurring eccentric to the right and moderate facet hypertrophy result in moderate right neural foraminal stenosis without spinal stenosis, unchanged. S1-2: Rudimentary disc.  No stenosis. IMPRESSION: 1. 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 2. Stable to slight progression of severe spinal stenosis and moderate neural foraminal stenosis at L2-3. 3. Slight progression of moderate spinal stenosis at L3-4. Electronically Signed   By: Logan Bores M.D.   On: 08/04/2021 20:22   DG Chest 1 View  Result Date: 08/04/2021 CLINICAL DATA:  Fall. EXAM: CHEST  1 VIEW COMPARISON:  Chest x-ray dated December 30, 2020. FINDINGS: Stable cardiomediastinal silhouette. Normal pulmonary vascularity. Trace left pleural effusion. No consolidation or pneumothorax. No acute osseous abnormality. IMPRESSION: 1. Trace left pleural effusion. Electronically Signed   By: Titus Dubin M.D.   On: 08/04/2021 11:34   DG Lumbar Spine Complete  Result Date: 08/04/2021 CLINICAL DATA:  Fall . EXAM: LUMBAR SPINE - COMPLETE 4+ VIEW COMPARISON:  None Available. FINDINGS: No evidence for an acute fracture. There is diffuse loss of intervertebral disc height in the lumbar spine with diffuse endplate degeneration. Trace anterolisthesis of L3 on 4 evident. IMPRESSION: Negative. Electronically Signed   By: Misty Stanley M.D.   On: 08/04/2021 11:30       The results of significant diagnostics from this hospitalization (including imaging, microbiology, ancillary  and laboratory) are listed below for reference.     Microbiology: Recent Results (from the past 240 hour(s))  Urine Culture     Status: None   Collection Time: 08/04/21 10:10 AM   Specimen: In/Out Cath Urine  Result Value Ref Range Status   Specimen Description IN/OUT CATH URINE  Final   Special Requests NONE  Final   Culture   Final    NO GROWTH Performed at [facility], [address], [place], [place] [address]    Report Status 08/06/2021 FINAL  Final  SARS Coronavirus 2 by RT PCR (hospital order, performed in [facility] hospital lab) *cepheid single result test* Anterior Nasal Swab     Status: None   Collection Time: 08/04/21 10:10 AM   Specimen: Anterior Nasal Swab  Result Value Ref Range Status   SARS Coronavirus 2 by RT PCR NEGATIVE NEGATIVE Final    Comment: (NOTE) SARS-CoV-2 target nucleic acids are NOT DETECTED.  The SARS-CoV-2 RNA is generally detectable in upper and lower respiratory specimens during the acute phase of infection. The lowest concentration of SARS-CoV-2 viral copies this assay can detect is 250 copies / mL. A negative result does not preclude SARS-CoV-2 infection and should not be used as the sole basis for treatment or other patient management decisions.  A negative result may occur with improper specimen collection / handling, submission of specimen other than nasopharyngeal swab, presence of viral mutation(s) within the areas targeted by this assay, and inadequate number of viral copies (<250 copies / mL). A negative result must be combined with clinical observations, patient history, and epidemiological information.  Fact Sheet for Patients:   https://www.patel.info/  Fact Sheet for Healthcare Providers: https://hall.com/  This test is not yet approved or  cleared by the [place] FDA and has been authorized for detection and/or diagnosis of SARS-CoV-2 by FDA under an Emergency Use  Authorization (EUA).  This EUA will remain in effect (meaning  this test can be used) for the duration of the COVID-19 declaration under Section 564(b)(1) of the Act, 21 U.S.C. section 360bbb-3(b)(1), unless the authorization is terminated or revoked sooner.  Performed at [facility], [place] [address], [place], [place] [address]   Blood Culture (routine x 2)     Status: None (Preliminary result)   Collection Time: 08/04/21 10:30 AM   Specimen: BLOOD  Result Value Ref Range Status   Specimen Description BLOOD BLOOD LEFT HAND  Final   Special Requests   Final    BOTTLES DRAWN AEROBIC AND ANAEROBIC Blood Culture results may not be optimal due to an inadequate volume of blood received in culture bottles   Culture   Final    NO GROWTH 4 DAYS Performed at [facility], [place] [address], [place], [place] [address]    Report Status PENDING  Incomplete  Blood Culture (routine x 2)     Status: None (Preliminary result)   Collection Time: 08/04/21 10:40 AM   Specimen: BLOOD  Result Value Ref Range Status   Specimen Description BLOOD RIGHT ANTECUBITAL  Final   Special Requests   Final    BOTTLES DRAWN AEROBIC AND ANAEROBIC Blood Culture adequate volume   Culture   Final    NO GROWTH 4 DAYS Performed at [facility], [place] [address] [place], [place] [address]    Report Status PENDING  Incomplete  Respiratory (~20 pathogens) panel by PCR     Status: None   Collection Time: 08/06/21  9:43 AM   Specimen: Nasopharyngeal Swab; Respiratory  Result Value Ref Range Status   Adenovirus NOT DETECTED NOT DETECTED Final   Coronavirus 229E NOT DETECTED NOT DETECTED Final    Comment: (NOTE) The Coronavirus on the Respiratory Panel, DOES NOT test for the novel  Coronavirus (2019 nCoV)    Coronavirus HKU1 NOT DETECTED NOT DETECTED Final   Coronavirus NL63 NOT DETECTED NOT DETECTED Final   Coronavirus OC43 NOT DETECTED NOT DETECTED Final   Metapneumovirus NOT DETECTED NOT DETECTED Final    Rhinovirus / Enterovirus NOT DETECTED NOT DETECTED Final   Influenza A NOT DETECTED NOT DETECTED Final   Influenza B NOT DETECTED NOT DETECTED Final   Parainfluenza Virus 1 NOT DETECTED NOT DETECTED Final   Parainfluenza Virus 2 NOT DETECTED NOT DETECTED Final   Parainfluenza Virus 3 NOT DETECTED NOT DETECTED Final   Parainfluenza Virus 4 NOT DETECTED NOT DETECTED Final   Respiratory Syncytial Virus NOT DETECTED NOT DETECTED Final   Bordetella pertussis NOT DETECTED NOT DETECTED Final   Bordetella Parapertussis NOT DETECTED NOT DETECTED Final   Chlamydophila pneumoniae NOT DETECTED NOT DETECTED Final   Mycoplasma pneumoniae NOT DETECTED NOT DETECTED Final    Comment: Performed at [facility], [place], [place], [place] [address]     Labs:  CBC: Recent Labs  Lab 08/04/21 1030 08/05/21 0437 08/06/21 0150 08/07/21 0158 08/08/21 0423  WBC 3.0* 3.0* 2.9* 3.0* 3.1*  NEUTROABS 2.3  --  2.1 1.8  --   HGB 10.6* 8.8* 8.7* 8.8* 8.8*  HCT 32.8* 26.9* 26.3* 27.4* 26.7*  MCV 93.7 91.5 92.0 92.6 91.4  PLT 95* 84* 83* 82* 98*   BMP &GFR Recent Labs  Lab 08/04/21 1030 08/05/21 0437 08/06/21 0150 08/07/21 0158 08/08/21 0423  NA 134* 135 135 134* 136  K 3.6 3.6 3.2* 3.2* 4.0  CL 104 107 106 107 111  CO2 25 20* 20* 18* 16*  GLUCOSE  180* 140* 143* 114* 104*  BUN 22 21 25* 27* 25*  CREATININE 1.70* 1.64* 1.97* 1.85* 1.57*  CALCIUM 8.1* 7.7* 7.7* 7.9* 8.4*  MG  --   --  1.7 2.0 2.0  PHOS  --   --  3.6 3.0 2.5   Estimated Creatinine Clearance: 40 mL/min (A) (by C-G formula based on SCr of 1.57 mg/dL (H)). Liver & Pancreas: Recent Labs  Lab 08/04/21 1030 08/06/21 0150 08/06/21 1122 08/07/21 0158 08/08/21 0423  AST 45*  --  75* 58*  --   ALT 24  --  33 32  --   ALKPHOS 85  --  68 59  --   BILITOT 1.0  --  1.2 1.1  --   PROT 6.1*  --  5.3* 4.9*  --   ALBUMIN 3.1* 2.4* 2.6* 2.4* 2.3*   No results for input(s): "LIPASE", "AMYLASE" in the last 168 hours. No  results for input(s): "AMMONIA" in the last 168 hours. Diabetic: No results for input(s): "HGBA1C" in the last 72 hours. Recent Labs  Lab 08/07/21 0850 08/07/21 1229 08/07/21 1630 08/07/21 2119 08/08/21 0720  GLUCAP 153* 121* 118* 123* 99   Cardiac Enzymes: Recent Labs  Lab 08/07/21 0158  CKTOTAL 244   No results for input(s): "PROBNP" in the last 8760 hours. Coagulation Profile: Recent Labs  Lab 08/04/21 1030  INR 1.7*   Thyroid Function Tests: No results for input(s): "TSH", "T4TOTAL", "FREET4", "T3FREE", "THYROIDAB" in the last 72 hours. Lipid Profile: No results for input(s): "CHOL", "HDL", "LDLCALC", "TRIG", "CHOLHDL", "LDLDIRECT" in the last 72 hours. Anemia Panel: Recent Labs    08/06/21 0150  VITAMINB12 229  FOLATE >40.0  FERRITIN 57  TIBC 280  IRON 24*  RETICCTPCT 1.8   Urine analysis:    Component Value Date/Time   COLORURINE YELLOW 08/04/2021 1611   APPEARANCEUR CLEAR 08/04/2021 1611   LABSPEC 1.014 08/04/2021 1611   PHURINE 5.0 08/04/2021 1611   GLUCOSEU NEGATIVE 08/04/2021 1611   HGBUR NEGATIVE 08/04/2021 1611   BILIRUBINUR NEGATIVE 08/04/2021 1611   KETONESUR NEGATIVE 08/04/2021 1611   PROTEINUR 30 (A) 08/04/2021 1611   NITRITE NEGATIVE 08/04/2021 1611   LEUKOCYTESUR NEGATIVE 08/04/2021 1611   Sepsis Labs: Invalid input(s): "PROCALCITONIN", "LACTICIDVEN"   SIGNED:  Mercy Riding, MD  [facility] 08/08/2021, 5:37 PM

## 2021-08-08 NOTE — Progress Notes (Signed)
PT Cancellation Note  Patient Details Name: Charles Lloyd. MRN: [id number] DOB: 02/20/1941   Cancelled Treatment:    Reason Eval/Treat Not Completed: (P) Patient declined, no reason specified (pt defers, reports no concerns with functional mobility and awaiting family to pick him up for DC.) Pt instructed on importance of continued mobility and verbal review of warm-up exercises and risks of immobility, pt also defers stair negotiation.  Aslynn Brunetti M Philisha Weinel 08/08/2021, 11:02 AM

## 2021-08-08 NOTE — Plan of Care (Signed)
  Problem: Coping: Goal: Level of anxiety will decrease Outcome: Progressing   Problem: Pain Managment: Goal: General experience of comfort will improve Outcome: Progressing   Problem: Safety: Goal: Ability to remain free from injury will improve Outcome: Progressing   Problem: Skin Integrity: Goal: Risk for impaired skin integrity will decrease Outcome: Progressing   

## 2021-08-08 NOTE — TOC Progression Note (Signed)
Transition of Care (TOC) - Progression Note    Patient Details  Name: Charles Lloyd. MRN: [id number] Date of Birth: [birth year]  Transition of Care [facility]) CM/SW Contact  [name], RN Phone Number: 08/08/2021, 10:07 AM  Clinical Narrative:    CM met with the patient at the bedside to discuss transitions of care to home.  The patient is being discharged today and plans to discharge to his home along with family support provided through the patient's son, Charles Lloyd. The patient's son works at [employer] and will be providing all necessary dme equipment for home - including the ordered rolling walker.  The patient's son plans to pick him up by private vehicle this morning and transport him home.  I provided choice regarding home health companies in the area and the patient did not have a preference.  I called [name] and [name] accepted the patient for services - including PT/OT.  Documentation for follow up included in the discharge instructions.  PCP - Follow up with be provided by previous PCP.  CM will follow for discharge needs to home.   Expected Discharge Plan: [name] Barriers to Discharge: No Barriers Identified  Expected Discharge Plan and Services Expected Discharge Plan: [place]   Discharge Planning Services: CM Consult Post Acute Care Choice: [place] arrangements for the past 2 months: Single Family Home Expected Discharge Date: 08/08/21               DME Arranged: [name] DME Agency: [name] Date DME Agency Contacted: 08/08/21 Time DME Agency Contacted: 22 Representative spoke with at DME Agency: Patient's son will be providing dme supplies - including RW HH Arranged: PT, OT HH Agency: [name] Date [facility] Agency Contacted: 08/08/21 Time [place]: 1005 Representative spoke with at [place]: [name]   Social Determinants of Health (SDOH) Interventions    Readmission Risk Interventions    08/08/2021   10:00 AM  Readmission Risk Prevention Plan  Transportation Screening Complete  Medication Review ([place]) Complete  PCP or Specialist appointment within 3-5 days of discharge Complete  HRI or [place] Complete  SW Recovery Care/Counseling Consult Not Complete  [place] Not Applicable

## 2021-08-08 NOTE — Progress Notes (Signed)
[facility] for Infectious Disease    Date of Admission:  08/04/2021   Total days of antibiotics 5/day 3 doxy         ID: Charles Lloyd. is a 81 y.o. male with  FUO Principal Problem:   Spinal stenosis Active Problems:   Paroxysmal A-fib (HCC)   Chronic diastolic (congestive) heart failure (HCC)   Neoplasm of prostate, malignant (HCC)   NIDDM-2 with polyneuropathy and hyperglycemia   Hyponatremia   Asthma   Essential hypertension   Chronic kidney disease, stage 3b (HCC)   SIRS (systemic inflammatory response syndrome) (HCC)   Thrombocytopenia (HCC)   Hyperglycemia   Pancytopenia ([place])   Fall at home, initial encounter   Paresthesia of lower extremity   Lower extremity weakness   FUO (fever of unknown origin)   Hypokalemia   Hypomagnesemia    Subjective: Still significant low back pain, with sensation of heaviness to lower extremities; no fever  Medications:   allopurinol  300 mg Oral Daily   apixaban  5 mg Oral BID   atorvastatin  20 mg Oral Daily   carvedilol  6.25 mg Oral BID   cycloSPORINE  1 drop Both Eyes BID   flecainide  100 mg Oral BID   fluticasone furoate-vilanterol  1 puff Inhalation Daily   gabapentin  600 mg Oral QHS   insulin aspart  0-6 Units Subcutaneous TID WC   montelukast  10 mg Oral QHS   sodium bicarbonate  650 mg Oral TID    Objective: Vital signs in last 24 hours: Temp:  [98 F (36.7 C)-99.1 F (37.3 C)] 99.1 F (37.3 C) (06/12 2111) Pulse Rate:  [66-79] 74 (06/12 2111) Resp:  [18] 18 (06/12 2111) BP: (113-154)/(65-71) 142/67 (06/12 2111) SpO2:  [97 %-100 %] 97 % (06/12 2111) Weight:  [86.9 kg] 86.9 kg (06/13 0500)  Physical Exam  Constitutional: He is oriented to person, place, and time. He appears well-developed and well-nourished. No distress.  HENT:  Mouth/Throat: Oropharynx is clear and moist. No oropharyngeal exudate.  Cardiovascular: Normal rate, regular rhythm and normal heart sounds. Exam reveals no gallop and no  friction rub.  No murmur heard.  Pulmonary/Chest: Effort normal and breath sounds normal. No respiratory distress. He has no wheezes.  Abdominal: Soft. Bowel sounds are normal. He exhibits no distension. There is no tenderness.  Lymphadenopathy:  He has no cervical adenopathy.  Neurological: He is alert and oriented to person, place, and time.  Skin: Skin is warm and dry. No rash noted. No erythema.  Psychiatric: He has a normal mood and affect. His behavior is normal.    Lab Results Recent Labs    08/07/21 0158 08/08/21 0423  WBC 3.0* 3.1*  HGB 8.8* 8.8*  HCT 27.4* 26.7*  NA 134* 136  K 3.2* 4.0  CL 107 111  CO2 18* 16*  BUN 27* 25*  CREATININE 1.85* 1.57*   Liver Panel Recent Labs    08/06/21 1122 08/07/21 0158 08/08/21 0423  PROT 5.3* 4.9*  --   ALBUMIN 2.6* 2.4* 2.3*  AST 75* 58*  --   ALT 33 32  --   ALKPHOS 68 59  --   BILITOT 1.2 1.1  --   BILIDIR 0.5*  --   --   IBILI 0.7  --   --    Sedimentation Rate Recent Labs    08/06/21 0150  ESRSEDRATE 52*   C-Reactive Protein Recent Labs    08/06/21 0150  CRP 8.4*    Microbiology: reviewed Studies/Results: No results found.   Assessment/Plan: Febrile illness with leukopenia/thrombocytopenia = thought to be tickborne illness but no recollection of tick bite. He has been on doxycycline and no longer having fevers and improvement of leukopenia and thrombocytopenia is slightly improved  No other positive cultures. Recommend to complete 10 day course of doxycycline (he needs 8 days including today) including what he has received presently. Take on full stomach, and wear sunscreen while on medication.  Recommend follow up with PCP in 10-14d to repeat cbc to see if normalized  Spinal stenosis = refer to outpatient NSGY.  Will sign off  [facility] for Infectious Diseases Pager: [phone]  08/08/2021, 9:58 AM

## 2021-08-08 NOTE — Plan of Care (Signed)
  Problem: Activity: Goal: Risk for activity intolerance will decrease Outcome: Progressing   Problem: Nutrition: Goal: Adequate nutrition will be maintained Outcome: Progressing   Problem: Coping: Goal: Level of anxiety will decrease 08/08/2021 0224 by Burnice Logan, LPN Outcome: Progressing 08/08/2021 0223 by Burnice Logan, LPN Outcome: Progressing   Problem: Elimination: Goal: Will not experience complications related to bowel motility Outcome: Progressing   Problem: Pain Managment: Goal: General experience of comfort will improve 08/08/2021 0224 by Burnice Logan, LPN Outcome: Progressing 08/08/2021 0223 by Burnice Logan, LPN Outcome: Progressing   Problem: Safety: Goal: Ability to remain free from injury will improve Outcome: Progressing   Problem: Skin Integrity: Goal: Risk for impaired skin integrity will decrease Outcome: Progressing

## 2021-08-09 LAB — CULTURE, BLOOD (ROUTINE X 2)
Culture: NO GROWTH
Culture: NO GROWTH
Special Requests: ADEQUATE

## 2021-08-09 NOTE — Telephone Encounter (Signed)
Transition Care Management Follow-up Telephone Call Date of discharge and from where: 08/08/21 from [facility] How have you been since you were released from the hospital? Pt states wobbly Any questions or concerns? No  Items Reviewed: Did the pt receive and understand the discharge instructions provided? Yes  Medications obtained and verified? Yes; pt states he has NOT started taking the new medications yet. Other? No  Any new allergies since your discharge? No  Dietary orders reviewed? No Do you have support at home? Yes   Home Care and Equipment/Supplies: Were home health services ordered? yes If so, what is the name of the agency? [facility]  Has the agency set up a time to come to the patient's home? yes Were any new equipment or medical supplies ordered?  Yes: Rolling Walker What is the name of the medical supply agency? [facility] Were you able to get the supplies/equipment? no Do you have any questions related to the use of the equipment or supplies? No  Functional Questionnaire: (I = Independent and D = Dependent) ADLs: I  Bathing/Dressing- I  Meal Prep- I  Eating- I  Maintaining continence- I  Transferring/Ambulation- I  Managing Meds- I  Follow up appointments reviewed:  PCP Hospital f/u appt confirmed? Yes  Scheduled to see Jerilee Hoh on 08/10/21 @ 2:30PM. Are transportation arrangements needed? Yes  If their condition worsens, is the pt aware to call PCP or go to the Emergency Dept.? Yes Was the patient provided with contact information for the PCP's office or ED? Yes Was to pt encouraged to call back with questions or concerns? Yes

## 2021-08-10 ENCOUNTER — Ambulatory Visit: Payer: Medicare Other | Admitting: Internal Medicine

## 2021-08-10 ENCOUNTER — Telehealth: Payer: Self-pay

## 2021-08-10 ENCOUNTER — Telehealth: Payer: Self-pay | Admitting: Internal Medicine

## 2021-08-10 ENCOUNTER — Encounter: Payer: Self-pay | Admitting: Internal Medicine

## 2021-08-10 VITALS — BP 102/62 | HR 80 | Temp 98.6°F | Wt 194.3 lb

## 2021-08-10 DIAGNOSIS — R29898 Other symptoms and signs involving the musculoskeletal system: Secondary | ICD-10-CM | POA: Diagnosis not present

## 2021-08-10 DIAGNOSIS — D61818 Other pancytopenia: Secondary | ICD-10-CM

## 2021-08-10 DIAGNOSIS — M4800 Spinal stenosis, site unspecified: Secondary | ICD-10-CM | POA: Diagnosis not present

## 2021-08-10 DIAGNOSIS — M6281 Muscle weakness (generalized): Secondary | ICD-10-CM | POA: Diagnosis not present

## 2021-08-10 DIAGNOSIS — R6510 Systemic inflammatory response syndrome (SIRS) of non-infectious origin without acute organ dysfunction: Secondary | ICD-10-CM

## 2021-08-10 DIAGNOSIS — Z09 Encounter for follow-up examination after completed treatment for conditions other than malignant neoplasm: Secondary | ICD-10-CM

## 2021-08-10 DIAGNOSIS — R509 Fever, unspecified: Secondary | ICD-10-CM

## 2021-08-10 NOTE — Telephone Encounter (Signed)
Spoke with Hilliard Clark and verbal orders given for PT and skilled nursing eval.

## 2021-08-10 NOTE — Telephone Encounter (Signed)
Requesting verbal order physical therapy starting today 1x1/2x4. Also requesting skilled nursing eval 1x1

## 2021-08-10 NOTE — Telephone Encounter (Signed)
   Primary Cardiologist: [name], MD  Chart reviewed as part of pre-operative protocol coverage. Given past medical history and time since last visit, based on ACC/AHA guidelines, Cebert Dettmann. would be at acceptable risk for the planned procedure without further cardiovascular testing.   Per office protocol, patient can hold Eliquis for 3 days prior to procedure.   Patient will not need bridging with Lovenox (enoxaparin) around procedure.  For orthopedic procedures please be sure to resume therapeutic (not prophylactic) dosing.    I will route this recommendation to the requesting party via Epic fax function and remove from pre-op pool.  Please call with questions.  Emmaline Life, NP-C    08/10/2021, 3:12 PM [place] [address] Office [phone] Fax [phone]

## 2021-08-10 NOTE — Telephone Encounter (Signed)
   Pre-operative Risk Assessment    Patient Name: Charles Lloyd.  DOB: 1940/10/29 MRN: [id number]      Request for Surgical Clearance    Procedure:   L4 - L5 ESI  Date of Surgery:  Clearance 08/24/21                                 Surgeon:  Dr Lenord Carbo Surgeon's Group or Practice Name:  [facility] Phone number:  [phone] Fax number:  [phone]   Type of Clearance Requested:   - Pharmacy:  Hold Apixaban (Eliquis) Eliquis   Type of Anesthesia:  Not Indicated   Additional requests/questions:   Discontinue Eliquis for 3 days prior, Then patient can resume the blood thinner day after  SignedToni Arthurs   08/10/2021, 8:05 AM

## 2021-08-10 NOTE — Progress Notes (Signed)
Hospital follow-up visit     CC/Reason for Visit: Hospitalization follow-up  HPI: Charles Lloyd. is a 81 y.o. male who is coming in today for the above mentioned reasons, specifically transitional care services face-to-face visit.    Dates hospitalized: 08/04/2021-08/08/2021 Days since discharge from hospital: 2 Patient was discharged from the hospital to: Home Reason for admission to hospital: Bilateral leg weakness and falls Date of interactive phone contact with patient and/or caregiver: 08/08/2021  I have reviewed in detail patient's discharge summary plus pertinent specific notes, labs, and images from the hospitalization.  Yes  He was admitted to the hospital on 6/9 after a fall at home.  He has been having bilateral leg weakness and numbness.  He had an MRI of the spine that showed severe lumbar spinal stenosis specifically at L4-5.  Neurosurgery was consulted in the hospital.  He was offered surgery versus epidural injection.  They would like to try outpatient epidural injections first.  While in the hospital he was also found to be febrile to 102.4 and meeting SIRS criteria.  This ended up being a fever of unknown origin, RMSF and Lyme titers were drawn and are still pending at this time.  He was also found to have pancytopenia, on discharge WBC count was 3.1, hemoglobin 8.8 and platelet count 98.  Since discharge he has significant abdominal and lower extremity swelling.  Son and patient are concerned as he was asked to stop his torsemide until June 20, I see no reason for this on his discharge summary.  They are wondering if he may resume.  Home health PT and OT were arranged.  Medication reconciliation was done today and patient is taking meds as recommended by discharging hospitalist/specialist.  Yes   Past Medical/Surgical History: Past Medical History:  Diagnosis Date   Acute tracheobronchitis 01/05/2019   B12 deficiency    Basal cell carcinoma (BCC) of left side of  nose 01/28/2018   Cancer ([place])    Cardiomyopathy ([place]) 03/10/2018   With chronic atrial fibrillation   CHF (congestive heart failure) ([place]) 04/16/2013   Functional class II, ejection fraction 35-40%  Formatting of this note might be different from the original. Functional class II, ejection fraction 35-40%   Chronic anticoagulation    Chronic diastolic (congestive) heart failure ([place]) 04/16/2013   Functional class II, ejection fraction 35-40%  Last Assessment & Plan:  Clinically stable Volume well controlled meds reviewed   Chronic diastolic CHF (congestive heart failure) ([place]) 04/16/2013   Functional class II, ejection fraction 35-40%  Last Assessment & Plan:  Clinically stable Volume well controlled meds reviewed   Colon polyps 04/16/2013   Diabetic neuropathy (HCC)    Eosinophil count raised 02/17/2019   Essential hypertension 01/06/2010   Last Assessment & Plan:  Well controlled Continue med management   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  Steroid usage was discontinued several months ago.  Follow up with Dermatology as planned.   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   Last Assessment & Plan:  Id following ?ongoing abx managementy reported Request records   Infection of prosthetic right knee joint (  [place])    Insomnia 03/04/2012   Grief with loss of wife 02/20/12  Last Assessment & Plan:  Improved sx  contineu xanax prn Se discussed   Lesion of liver 04/20/2019   -On cardiac CT 02/2019   Mild reactive airways disease    Neoplasm of prostate 04/16/2013   Neoplasm of prostate, malignant ([place]) 01/06/2010   [name]  S/p radiation therapy   Last Assessment & Plan:  Repeat psa today No urinary sx Pt reported fatigue/poor appetite   On amiodarone therapy 09/07/2013   On continuous oral anticoagulation 01/28/2018   Other activity(E029.9) 04/20/2013   Formatting of this note might be different from the original. Transthoracic Echocardiogram-03/10/2013-[facility]: Normal left ventricular wall thickness and cavity size.  Global left ventricular systolic function is moderately reduced.  The estimated ejection fraction is 35-40%.  The left atrium is moderately enlarged.  The right atrium is mildly enlarged.  No significant valvular    Overweight (BMI 25.0-29.9) 10/22/2016   Persistent atrial fibrillation ([place]) 04/16/2013   Last Assessment & Plan:  Rate controlled Continue med management eliquis for stroke preventino  Formatting of this note might be different from the original.  Drug  HX Current Rx Pre-ABL inefficacy Pre-ABL intolerant Post-ABL inefficacy Post-ABL intolerant max dose/24h M/Y end comments  sotalol                  dofetilide                  flecainide                  propafenone                  am   S/P TKR (total knee replacement), bilateral    Secondary hypercoagulable state ([place]) 04/09/2019   Tinea cruris 04/16/2013   Type 2 diabetes mellitus with diabetic polyneuropathy, without long-term current use of insulin ([place]) 04/16/2013   Last Assessment & Plan:  Labs today Pt reports well controlled on ambulatory monitoring   Vitamin D deficiency 07/25/2018    Past Surgical History:  Procedure Laterality Date   ATRIAL FIBRILLATION ABLATION N/A 03/12/2019   Procedure: ATRIAL FIBRILLATION ABLATION;  Surgeon: [name], MD;  Location: [facility];  Service: Cardiovascular;  Laterality: N/A;   ATRIAL FIBRILLATION ABLATION N/A 04/29/2020   Procedure: ATRIAL FIBRILLATION ABLATION;  Surgeon: [name], MD;  Location: [facility];  Service: Cardiovascular;  Laterality:  N/A;   CARDIAC ELECTROPHYSIOLOGY STUDY AND ABLATION     CARDIOVERSION     CARDIOVERSION N/A 10/10/2018   Procedure: CARDIOVERSION;  Surgeon: [name], MD;  Location: [facility] ENDOSCOPY;  Service: Cardiovascular;  Laterality: N/A;   CARDIOVERSION N/A 05/18/2020   Procedure: CARDIOVERSION;  Surgeon: [name], MD;  Location[facility] ENDOSCOPY;  Service: Cardiovascular;  Laterality: N/A;   CARDIOVERSION N/A 12/09/2020   Procedure: CARDIOVERSION;  Surgeon: Lelon Perla, MD;  Location: [facility] ENDOSCOPY;  Service: Cardiovascular;  Laterality: N/A;   CHOLECYSTECTOMY     PROSTATECTOMY     REPLACEMENT TOTAL KNEE BILATERAL      Social History:  reports that he has never smoked. He has never used smokeless tobacco. He reports current alcohol use of about 14.0 standard drinks of alcohol per week. He reports that he does not use drugs.  Allergies: No Known Allergies  Family History:  Family History  Problem Relation Age of Onset   Cancer Mother  Depression Mother    Early death Mother    Cancer Father    Depression Father    Early death Father      Current Outpatient Medications:    albuterol (VENTOLIN HFA) 108 (90 Base) MCG/ACT inhaler, Inhale 1-2 puffs into the lungs every 6 (six) hours as needed for wheezing or shortness of breath., Disp: 8 g, Rfl: 2   allopurinol (ZYLOPRIM) 300 MG tablet, Take 1 tablet (300 mg total) by mouth daily., Disp: 90 tablet, Rfl: 1   atorvastatin (LIPITOR) 20 MG tablet, TAKE 1 TABLET DAILY (Patient taking differently: Take 20 mg by mouth daily.), Disp: 90 tablet, Rfl: 1   carvedilol (COREG) 12.5 MG tablet, Take 1 tablet (12.5 mg total) by mouth 2 (two) times daily. (Patient taking differently: Take 12.5 mg by mouth every evening.), Disp: 180 tablet, Rfl: 2   colchicine 0.6 MG tablet, Take 1 tablet (0.6 mg total) by mouth 2 (two) times daily as needed., Disp: 30 tablet, Rfl: 0   doxycycline (VIBRAMYCIN) 100 MG capsule, Take 1 capsule (100 mg total) by mouth 2  (two) times daily for 7 days., Disp: 14 capsule, Rfl: 0   ELIQUIS 5 MG TABS tablet, TAKE 1 TABLET TWICE A DAY (Patient taking differently: Take 5 mg by mouth 2 (two) times daily.), Disp: 180 tablet, Rfl: 0   flecainide (TAMBOCOR) 100 MG tablet, Take 1 tablet (100 mg total) by mouth 2 (two) times daily., Disp: 180 tablet, Rfl: 1   fluticasone (FLONASE) 50 MCG/ACT nasal spray, Place 2 sprays into both nostrils daily. (Patient taking differently: Place 2 sprays into both nostrils daily as needed for allergies.), Disp: 16 g, Rfl: 2   fluticasone furoate-vilanterol (BREO ELLIPTA) 100-25 MCG/INH AEPB, Inhale 1 puff into the lungs daily. (Patient taking differently: Inhale 1 puff into the lungs daily as needed (SOB).), Disp: 60 each, Rfl: 11   gabapentin (NEURONTIN) 300 MG capsule, TAKE 2 CAPSULES BY MOUTH AT BEDTIME (Patient taking differently: Take 600 mg by mouth at bedtime.), Disp: 60 capsule, Rfl: 2   glucose blood (ACCU-CHEK AVIVA PLUS) test strip, 1 each by Other route daily. Dx E11.9, Disp: 100 each, Rfl: 12   [START ON 08/15/2021] KLOR-CON M20 20 MEQ tablet, Take 1 tablet (20 mEq total) by mouth daily., Disp: , Rfl:    montelukast (SINGULAIR) 10 MG tablet, Take 10 mg by mouth at bedtime. , Disp: , Rfl:    RESTASIS 0.05 % ophthalmic emulsion, Place 1 drop into both eyes 2 (two) times daily., Disp: , Rfl:    sodium bicarbonate 650 MG tablet, Take 1 tablet (650 mg total) by mouth 3 (three) times daily for 15 doses., Disp: 15 tablet, Rfl: 0   [START ON 08/15/2021] torsemide (DEMADEX) 20 MG tablet, Take 1 tablet (20 mg total) by mouth daily., Disp: , Rfl:    triamcinolone cream (KENALOG) 0.1 %, Apply 1 application. topically 2 (two) times daily. (Patient taking differently: Apply 1 application  topically daily as needed (itching).), Disp: 453 g, Rfl: 0   [START ON 08/15/2021] valsartan (DIOVAN) 160 MG tablet, Take 1 tablet (160 mg total) by mouth daily. Patient must keep April 2023 appointment for further  refills. 3 rd/ final attempt, Disp: 90 tablet, Rfl: 0  Review of Systems:  Constitutional: Denies fever, chills, diaphoresis. HEENT: Denies photophobia, eye pain, redness, hearing loss, ear pain, congestion, sore throat, rhinorrhea, sneezing, mouth sores, trouble swallowing, neck pain, neck stiffness and tinnitus.   Respiratory: Denies SOB, DOE, cough, chest tightness,  and wheezing.   Cardiovascular: Denies chest pain, palpitations and leg swelling.  Gastrointestinal: Denies nausea, vomiting, abdominal pain, diarrhea, constipation, blood in stool and abdominal distention.  Genitourinary: Denies dysuria, urgency, frequency, hematuria, flank pain and difficulty urinating.  Endocrine: Denies: hot or cold intolerance, sweats, changes in hair or nails, polyuria, polydipsia. Musculoskeletal: Denies myalgias, back pain, joint swelling, arthralgias. Skin: Denies pallor, rash and wound.  Neurological: Denies dizziness, seizures, syncope,  light-headedness, numbness and headaches.  Hematological: Denies adenopathy. Easy bruising, personal or family bleeding history  Psychiatric/Behavioral: Denies suicidal ideation, mood changes, confusion, nervousness, sleep disturbance and agitation    Physical Exam: Vitals:   08/10/21 1436  BP: 102/62  Pulse: 80  Temp: 98.6 F (37 C)  TempSrc: Oral  SpO2: 99%  Weight: 194 lb 4.8 oz (88.1 kg)    Body mass index is 27.88 kg/m.   Constitutional: NAD, calm, comfortable Eyes: PERRL, lids and conjunctivae normal ENMT: Mucous membranes are moist.  Respiratory: clear to auscultation bilaterally, no wheezing, no crackles. Normal respiratory effort. No accessory muscle use.  Cardiovascular: Regular rate and rhythm, no murmurs / rubs / gallops.  2+ pitting lower extremity edema bilaterally  Psychiatric: Normal judgment and insight. Alert and oriented x 3.  Mood is depressed   Impression and Plan:  Hospital discharge follow-up  FUO (fever of unknown  origin)  Spinal stenosis, unspecified spinal region  SIRS (systemic inflammatory response syndrome) (HCC)  Weakness of both lower extremities - Plan: Comprehensive metabolic panel, Comprehensive metabolic panel  Pancytopenia ([place]) - Plan: CBC with Differential/Platelet, CBC with Differential/Platelet  -Hospital charts have been reviewed in detail, medication reconciliation has been performed. -I will order labs as recommended by discharging hospitalist to follow blood counts and renal function. -Okay to resume torsemide 20 mg daily as he was doing prior to hospital admission. -PT OT orders will be signed. -Pending blood counts will likely place referral to hematology. -He has been tentatively scheduled at the end of the month for an epidural steroid injection.   Medical decision making of high complexity was utilized today.     Lelon Frohlich, MD [place] Jacklynn Ganong

## 2021-08-10 NOTE — Telephone Encounter (Signed)
Patient with diagnosis of atrial fibrillation on Eliquis for anticoagulation.    Procedure: L4-L5 ESI Date of procedure: 08/24/21   CHA2DS2-VASc Score =     This indicates a  % annual risk of stroke. The patient's score is based upon: CHF History: 1 Diabetes History: 1 Stroke History: 0 Vascular Disease History: 0    CrCl 42 Platelet count 98  Per office protocol, patient can hold Eliquis for 3 days prior to procedure.   Patient will not need bridging with Lovenox (enoxaparin) around procedure.  For orthopedic procedures please be sure to resume therapeutic (not prophylactic) dosing.

## 2021-08-11 ENCOUNTER — Inpatient Hospital Stay (HOSPITAL_COMMUNITY)
Admission: EM | Admit: 2021-08-11 | Discharge: 2021-08-16 | DRG: 291 | Disposition: A | Discharge status: Skilled Nursing Facility | Payer: Medicare Other | Attending: Internal Medicine | Admitting: Internal Medicine

## 2021-08-11 ENCOUNTER — Telehealth: Payer: Self-pay | Admitting: Internal Medicine

## 2021-08-11 ENCOUNTER — Other Ambulatory Visit: Payer: Self-pay | Admitting: Cardiology

## 2021-08-11 ENCOUNTER — Emergency Department (HOSPITAL_COMMUNITY): Payer: Medicare Other

## 2021-08-11 ENCOUNTER — Other Ambulatory Visit: Payer: Self-pay

## 2021-08-11 ENCOUNTER — Encounter (HOSPITAL_COMMUNITY): Payer: Self-pay | Admitting: Emergency Medicine

## 2021-08-11 DIAGNOSIS — I10 Essential (primary) hypertension: Secondary | ICD-10-CM | POA: Diagnosis present

## 2021-08-11 DIAGNOSIS — Z79899 Other long term (current) drug therapy: Secondary | ICD-10-CM | POA: Diagnosis not present

## 2021-08-11 DIAGNOSIS — Z96653 Presence of artificial knee joint, bilateral: Secondary | ICD-10-CM | POA: Diagnosis present

## 2021-08-11 DIAGNOSIS — E7800 Pure hypercholesterolemia, unspecified: Secondary | ICD-10-CM | POA: Diagnosis present

## 2021-08-11 DIAGNOSIS — N1832 Chronic kidney disease, stage 3b: Secondary | ICD-10-CM | POA: Diagnosis present

## 2021-08-11 DIAGNOSIS — I429 Cardiomyopathy, unspecified: Secondary | ICD-10-CM | POA: Diagnosis present

## 2021-08-11 DIAGNOSIS — Z20822 Contact with and (suspected) exposure to covid-19: Secondary | ICD-10-CM | POA: Diagnosis present

## 2021-08-11 DIAGNOSIS — M48061 Spinal stenosis, lumbar region without neurogenic claudication: Secondary | ICD-10-CM | POA: Diagnosis present

## 2021-08-11 DIAGNOSIS — T380X5A Adverse effect of glucocorticoids and synthetic analogues, initial encounter: Secondary | ICD-10-CM | POA: Diagnosis present

## 2021-08-11 DIAGNOSIS — I480 Paroxysmal atrial fibrillation: Secondary | ICD-10-CM | POA: Diagnosis present

## 2021-08-11 DIAGNOSIS — E1142 Type 2 diabetes mellitus with diabetic polyneuropathy: Secondary | ICD-10-CM | POA: Diagnosis present

## 2021-08-11 DIAGNOSIS — E1122 Type 2 diabetes mellitus with diabetic chronic kidney disease: Secondary | ICD-10-CM | POA: Diagnosis present

## 2021-08-11 DIAGNOSIS — A7740 Ehrlichiosis, unspecified: Secondary | ICD-10-CM | POA: Diagnosis present

## 2021-08-11 DIAGNOSIS — Z7951 Long term (current) use of inhaled steroids: Secondary | ICD-10-CM

## 2021-08-11 DIAGNOSIS — E1169 Type 2 diabetes mellitus with other specified complication: Secondary | ICD-10-CM | POA: Diagnosis present

## 2021-08-11 DIAGNOSIS — Z8546 Personal history of malignant neoplasm of prostate: Secondary | ICD-10-CM

## 2021-08-11 DIAGNOSIS — M109 Gout, unspecified: Secondary | ICD-10-CM | POA: Diagnosis present

## 2021-08-11 DIAGNOSIS — I130 Hypertensive heart and chronic kidney disease with heart failure and stage 1 through stage 4 chronic kidney disease, or unspecified chronic kidney disease: Principal | ICD-10-CM | POA: Diagnosis present

## 2021-08-11 DIAGNOSIS — Z923 Personal history of irradiation: Secondary | ICD-10-CM

## 2021-08-11 DIAGNOSIS — E1165 Type 2 diabetes mellitus with hyperglycemia: Secondary | ICD-10-CM | POA: Diagnosis present

## 2021-08-11 DIAGNOSIS — E785 Hyperlipidemia, unspecified: Secondary | ICD-10-CM

## 2021-08-11 DIAGNOSIS — R0609 Other forms of dyspnea: Secondary | ICD-10-CM | POA: Diagnosis not present

## 2021-08-11 DIAGNOSIS — I5033 Acute on chronic diastolic (congestive) heart failure: Secondary | ICD-10-CM | POA: Diagnosis present

## 2021-08-11 DIAGNOSIS — C61 Malignant neoplasm of prostate: Secondary | ICD-10-CM | POA: Diagnosis not present

## 2021-08-11 DIAGNOSIS — Z85828 Personal history of other malignant neoplasm of skin: Secondary | ICD-10-CM

## 2021-08-11 DIAGNOSIS — Z7901 Long term (current) use of anticoagulants: Secondary | ICD-10-CM

## 2021-08-11 DIAGNOSIS — I509 Heart failure, unspecified: Secondary | ICD-10-CM

## 2021-08-11 DIAGNOSIS — R531 Weakness: Secondary | ICD-10-CM | POA: Diagnosis present

## 2021-08-11 DIAGNOSIS — R509 Fever, unspecified: Secondary | ICD-10-CM | POA: Diagnosis not present

## 2021-08-11 LAB — BASIC METABOLIC PANEL
Anion gap: 8 (ref 5–15)
BUN: 31 mg/dL — ABNORMAL HIGH (ref 8–23)
CO2: 20 mmol/L — ABNORMAL LOW (ref 22–32)
Calcium: 8.7 mg/dL — ABNORMAL LOW (ref 8.9–10.3)
Chloride: 107 mmol/L (ref 98–111)
Creatinine, Ser: 1.61 mg/dL — ABNORMAL HIGH (ref 0.61–1.24)
GFR, Estimated: 44 mL/min — ABNORMAL LOW (ref >60)
Glucose, Bld: 178 mg/dL — ABNORMAL HIGH (ref 70–99)
Potassium: 4.6 mmol/L (ref 3.5–5.1)
Sodium: 135 mmol/L (ref 135–145)

## 2021-08-11 LAB — URINALYSIS, ROUTINE W REFLEX MICROSCOPIC
Bilirubin Urine: NEGATIVE
Glucose, UA: NEGATIVE mg/dL
Hgb urine dipstick: NEGATIVE
Ketones, ur: NEGATIVE mg/dL
Leukocytes,Ua: NEGATIVE
Nitrite: NEGATIVE
Protein, ur: NEGATIVE mg/dL
Specific Gravity, Urine: 1.008 (ref 1.005–1.030)
pH: 5 (ref 5.0–8.0)

## 2021-08-11 LAB — CBC WITH DIFFERENTIAL/PLATELET
Basophils Absolute: 0 10*3/uL (ref 0.0–0.1)
Basophils Relative: 0.5 % (ref 0.0–3.0)
Eosinophils Absolute: 0 10*3/uL (ref 0.0–0.7)
Eosinophils Relative: 0.4 % (ref 0.0–5.0)
HCT: 29.3 % — ABNORMAL LOW (ref 39.0–52.0)
Hemoglobin: 9.7 g/dL — ABNORMAL LOW (ref 13.0–17.0)
Lymphocytes Relative: 18.5 % (ref 12.0–46.0)
Lymphs Abs: 1 10*3/uL (ref 0.7–4.0)
MCHC: 33 g/dL (ref 30.0–36.0)
MCV: 90.7 fl (ref 78.0–100.0)
Monocytes Absolute: 0.6 10*3/uL (ref 0.1–1.0)
Monocytes Relative: 11.2 % (ref 3.0–12.0)
Neutro Abs: 3.9 10*3/uL (ref 1.4–7.7)
Neutrophils Relative %: 69.4 % (ref 43.0–77.0)
Platelets: 168 10*3/uL (ref 150.0–400.0)
RBC: 3.24 Mil/uL — ABNORMAL LOW (ref 4.22–5.81)
RDW: 16.9 % — ABNORMAL HIGH (ref 11.5–15.5)
WBC: 5.6 10*3/uL (ref 4.0–10.5)

## 2021-08-11 LAB — CBC
HCT: 29.3 % — ABNORMAL LOW (ref 39.0–52.0)
Hemoglobin: 9.4 g/dL — ABNORMAL LOW (ref 13.0–17.0)
MCH: 29.3 pg (ref 26.0–34.0)
MCHC: 32.1 g/dL (ref 30.0–36.0)
MCV: 91.3 fL (ref 80.0–100.0)
Platelets: 202 10*3/uL (ref 150–400)
RBC: 3.21 MIL/uL — ABNORMAL LOW (ref 4.22–5.81)
RDW: 16.8 % — ABNORMAL HIGH (ref 11.5–15.5)
WBC: 6 10*3/uL (ref 4.0–10.5)
nRBC: 0 % (ref 0.0–0.2)

## 2021-08-11 LAB — COMPREHENSIVE METABOLIC PANEL
ALT: 29 U/L (ref 0–53)
AST: 33 U/L (ref 0–37)
Albumin: 3.1 g/dL — ABNORMAL LOW (ref 3.5–5.2)
Alkaline Phosphatase: 73 U/L (ref 39–117)
BUN: 25 mg/dL — ABNORMAL HIGH (ref 6–23)
CO2: 21 mEq/L (ref 19–32)
Calcium: 8.6 mg/dL (ref 8.4–10.5)
Chloride: 107 mEq/L (ref 96–112)
Creatinine, Ser: 1.61 mg/dL — ABNORMAL HIGH (ref 0.40–1.50)
GFR: 40.68 mL/min — ABNORMAL LOW (ref >60.00)
Glucose, Bld: 158 mg/dL — ABNORMAL HIGH (ref 70–99)
Potassium: 4.7 mEq/L (ref 3.5–5.1)
Sodium: 136 mEq/L (ref 135–145)
Total Bilirubin: 0.9 mg/dL (ref 0.2–1.2)
Total Protein: 5.7 g/dL — ABNORMAL LOW (ref 6.0–8.3)

## 2021-08-11 LAB — RESP PANEL BY RT-PCR (FLU A&B, COVID) ARPGX2
Influenza A by PCR: NEGATIVE
Influenza B by PCR: NEGATIVE
SARS Coronavirus 2 by RT PCR: NEGATIVE

## 2021-08-11 LAB — TROPONIN I (HIGH SENSITIVITY)
Troponin I (High Sensitivity): 8 ng/L (ref <18)
Troponin I (High Sensitivity): 7 ng/L (ref <18)

## 2021-08-11 LAB — C DIFFICILE QUICK SCREEN W PCR REFLEX
C Diff antigen: NEGATIVE
C Diff interpretation: NOT DETECTED
C Diff toxin: NEGATIVE

## 2021-08-11 LAB — BRAIN NATRIURETIC PEPTIDE: B Natriuretic Peptide: 478.5 pg/mL — ABNORMAL HIGH (ref 0.0–100.0)

## 2021-08-11 LAB — GLUCOSE, CAPILLARY: Glucose-Capillary: 149 mg/dL — ABNORMAL HIGH (ref 70–99)

## 2021-08-11 IMAGING — CR DG CHEST 2V
2 series · 2 of 2 positions shown · non-contrast
Comparison: [DATE]

CLINICAL DATA: Shortness of breath. Lower extremity swelling.
Weakness. Pancytopenia.

EXAM:
CHEST - 2 VIEW

[w chest lat]
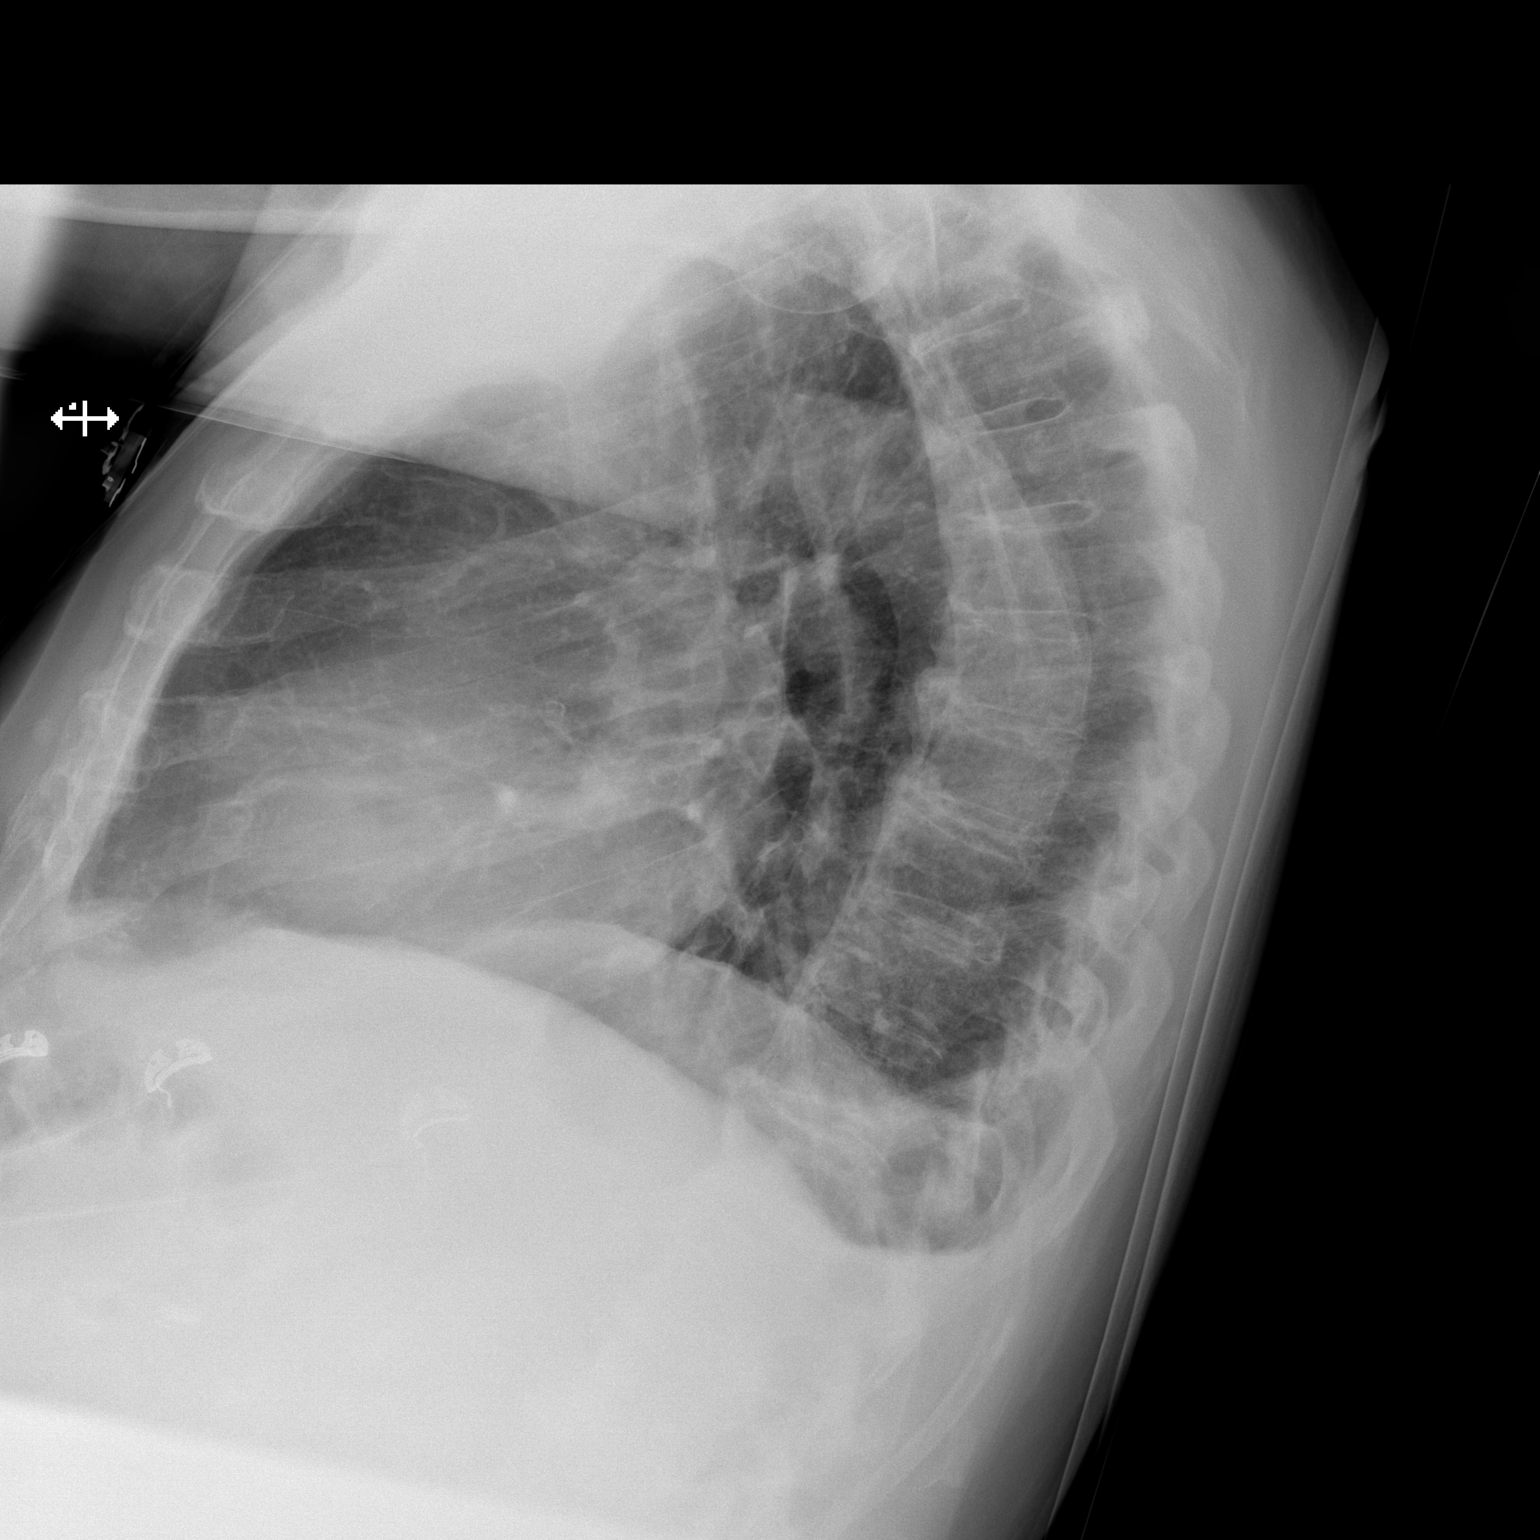

[x chest ap]
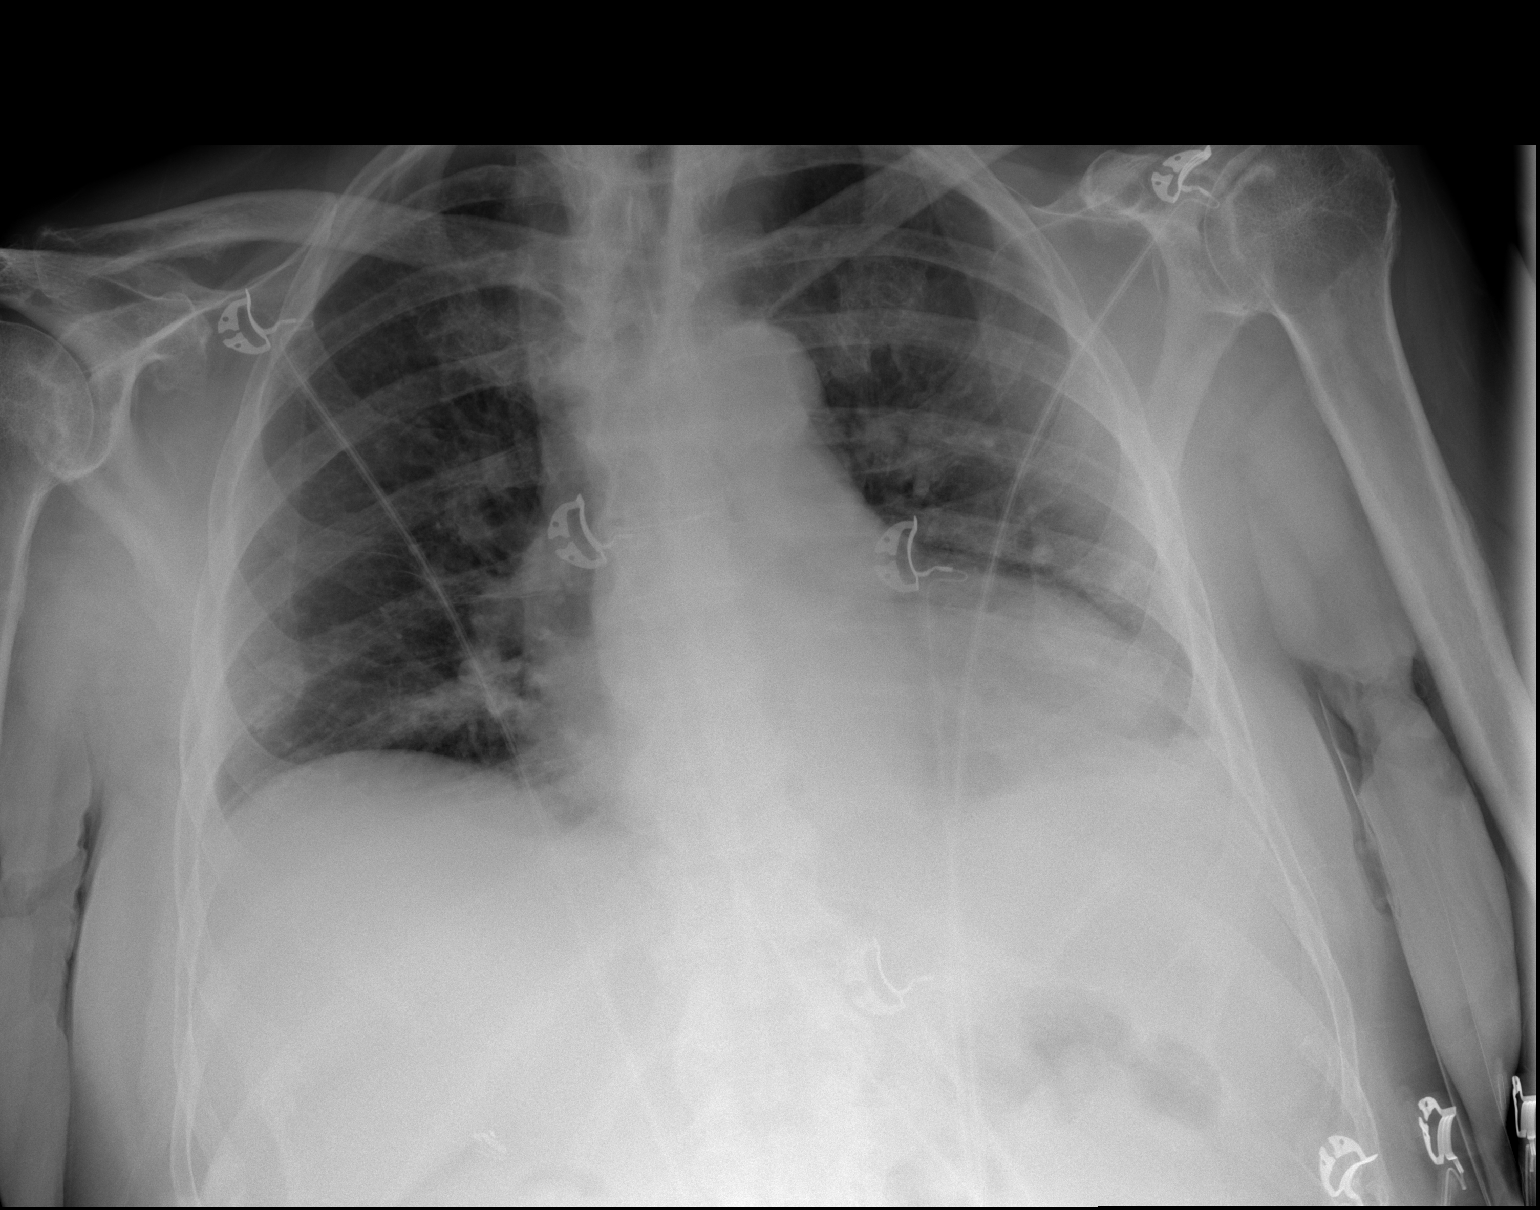

[2 of 2 positions shown; findings below may reference images not displayed]

FINDINGS: The heart size and mediastinal contours are within normal limits.
Low lung volumes noted. Tiny right posterior pleural effusion or
thickening noted on the lateral projection. Both lungs are otherwise
clear. The visualized skeletal structures are unremarkable.
IMPRESSION: Tiny right posterior pleural effusion versus thickening. No other
acute findings.

## 2021-08-11 MED ORDER — FLECAINIDE ACETATE 100 MG PO TABS
100.0000 mg | ORAL_TABLET | Freq: Two times a day (BID) | ORAL | Status: DC
  Administered 2021-08-11 – 2021-08-16 (×10): 100 mg via ORAL
  Filled 2021-08-11 (×12): qty 1

## 2021-08-11 MED ORDER — GABAPENTIN 300 MG PO CAPS
600.0000 mg | ORAL_CAPSULE | Freq: Every day | ORAL | Status: DC
  Administered 2021-08-11 – 2021-08-15 (×5): 600 mg via ORAL
  Filled 2021-08-11 (×5): qty 2

## 2021-08-11 MED ORDER — CARVEDILOL 12.5 MG PO TABS
12.5000 mg | ORAL_TABLET | Freq: Every day | ORAL | Status: DC
Start: 2021-08-12 — End: 2021-08-15
  Administered 2021-08-12 – 2021-08-15 (×4): 12.5 mg via ORAL
  Filled 2021-08-11 (×4): qty 1

## 2021-08-11 MED ORDER — ATORVASTATIN CALCIUM 10 MG PO TABS
20.0000 mg | ORAL_TABLET | Freq: Every day | ORAL | Status: DC
  Administered 2021-08-11 – 2021-08-16 (×6): 20 mg via ORAL
  Filled 2021-08-11 (×6): qty 2

## 2021-08-11 MED ORDER — INSULIN ASPART 100 UNIT/ML IJ SOLN
0.0000 [IU] | Freq: Every day | INTRAMUSCULAR | Status: DC
  Administered 2021-08-12: 2 [IU] via SUBCUTANEOUS
  Administered 2021-08-13: 3 [IU] via SUBCUTANEOUS
  Administered 2021-08-14: 2 [IU] via SUBCUTANEOUS
  Administered 2021-08-15: 4 [IU] via SUBCUTANEOUS

## 2021-08-11 MED ORDER — CYCLOSPORINE 0.05 % OP EMUL
1.0000 [drp] | Freq: Two times a day (BID) | OPHTHALMIC | Status: DC | PRN
Start: 2021-08-11 — End: 2021-08-16

## 2021-08-11 MED ORDER — POTASSIUM CHLORIDE CRYS ER 20 MEQ PO TBCR
20.0000 meq | EXTENDED_RELEASE_TABLET | Freq: Every day | ORAL | Status: DC
  Administered 2021-08-12 – 2021-08-16 (×5): 20 meq via ORAL
  Filled 2021-08-11 (×5): qty 1

## 2021-08-11 MED ORDER — TRIAMCINOLONE ACETONIDE 0.1 % EX CREA
1.0000 | TOPICAL_CREAM | Freq: Every day | CUTANEOUS | Status: DC | PRN
Start: 2021-08-11 — End: 2021-08-16

## 2021-08-11 MED ORDER — SODIUM CHLORIDE 0.9 % IV SOLN: 250.0000 mL | INTRAVENOUS | Status: DC | PRN

## 2021-08-11 MED ORDER — MONTELUKAST SODIUM 10 MG PO TABS
10.0000 mg | ORAL_TABLET | Freq: Every day | ORAL | Status: DC
  Administered 2021-08-11 – 2021-08-15 (×5): 10 mg via ORAL
  Filled 2021-08-11 (×5): qty 1

## 2021-08-11 MED ORDER — SODIUM BICARBONATE 650 MG PO TABS
650.0000 mg | ORAL_TABLET | Freq: Three times a day (TID) | ORAL | Status: DC
  Administered 2021-08-11 – 2021-08-16 (×14): 650 mg via ORAL
  Filled 2021-08-11 (×14): qty 1

## 2021-08-11 MED ORDER — ONDANSETRON HCL 4 MG/2ML IJ SOLN: 4.0000 mg | Freq: Four times a day (QID) | INTRAMUSCULAR | Status: DC | PRN

## 2021-08-11 MED ORDER — FUROSEMIDE 10 MG/ML IJ SOLN
40.0000 mg | Freq: Two times a day (BID) | INTRAMUSCULAR | Status: DC
  Administered 2021-08-12 – 2021-08-14 (×5): 40 mg via INTRAVENOUS
  Filled 2021-08-11 (×5): qty 4

## 2021-08-11 MED ORDER — GLUCOSE BLOOD VI STRP
1.0000 | ORAL_STRIP | Freq: Every day | Status: DC
Start: 2021-08-11 — End: 2021-08-11

## 2021-08-11 MED ORDER — APIXABAN 5 MG PO TABS
5.0000 mg | ORAL_TABLET | Freq: Two times a day (BID) | ORAL | Status: DC
  Administered 2021-08-11 – 2021-08-16 (×10): 5 mg via ORAL
  Filled 2021-08-11 (×10): qty 1

## 2021-08-11 MED ORDER — SODIUM CHLORIDE 0.9% FLUSH: 3.0000 mL | INTRAVENOUS | Status: DC | PRN

## 2021-08-11 MED ORDER — SODIUM CHLORIDE 0.9% FLUSH
3.0000 mL | Freq: Two times a day (BID) | INTRAVENOUS | Status: DC
  Administered 2021-08-11 – 2021-08-16 (×10): 3 mL via INTRAVENOUS

## 2021-08-11 MED ORDER — IRBESARTAN 150 MG PO TABS
150.0000 mg | ORAL_TABLET | Freq: Every day | ORAL | Status: DC
  Administered 2021-08-15: 150 mg via ORAL
  Filled 2021-08-11: qty 1

## 2021-08-11 MED ORDER — ALBUTEROL SULFATE (2.5 MG/3ML) 0.083% IN NEBU: 3.0000 mL | INHALATION_SOLUTION | Freq: Four times a day (QID) | RESPIRATORY_TRACT | Status: DC | PRN

## 2021-08-11 MED ORDER — ACETAMINOPHEN 325 MG PO TABS
650.0000 mg | ORAL_TABLET | ORAL | Status: DC | PRN
  Administered 2021-08-12 (×2): 650 mg via ORAL
  Filled 2021-08-11 (×2): qty 2

## 2021-08-11 MED ORDER — COLCHICINE 0.6 MG PO TABS: 0.6000 mg | ORAL_TABLET | Freq: Two times a day (BID) | ORAL | Status: DC

## 2021-08-11 MED ORDER — FLUTICASONE PROPIONATE 50 MCG/ACT NA SUSP: 2.0000 | Freq: Every day | NASAL | Status: DC | PRN

## 2021-08-11 MED ORDER — ALLOPURINOL 300 MG PO TABS: 300.0000 mg | ORAL_TABLET | Freq: Every day | ORAL | Status: DC | PRN

## 2021-08-11 MED ORDER — FLUTICASONE FUROATE-VILANTEROL 100-25 MCG/INH IN AEPB: 1.0000 | INHALATION_SPRAY | Freq: Every day | RESPIRATORY_TRACT | Status: DC | PRN

## 2021-08-11 MED ORDER — INSULIN ASPART 100 UNIT/ML IJ SOLN
0.0000 [IU] | Freq: Three times a day (TID) | INTRAMUSCULAR | Status: DC
  Administered 2021-08-12 (×2): 3 [IU] via SUBCUTANEOUS
  Administered 2021-08-13: 5 [IU] via SUBCUTANEOUS
  Administered 2021-08-13: 3 [IU] via SUBCUTANEOUS
  Administered 2021-08-13: 5 [IU] via SUBCUTANEOUS
  Administered 2021-08-14 – 2021-08-15 (×4): 8 [IU] via SUBCUTANEOUS
  Administered 2021-08-15: 15 [IU] via SUBCUTANEOUS
  Administered 2021-08-16: 5 [IU] via SUBCUTANEOUS

## 2021-08-11 MED ORDER — FUROSEMIDE 10 MG/ML IJ SOLN
40.0000 mg | Freq: Once | INTRAMUSCULAR | Status: AC
  Administered 2021-08-11: 40 mg via INTRAVENOUS
  Filled 2021-08-11: qty 4

## 2021-08-11 NOTE — ED Provider Notes (Signed)
[facility] DEPT Provider Note   CSN: [id number] Arrival date & time: 08/11/21  1425  History  Chief Complaint  Patient presents with   Shortness of Breath   Charles Bress. is a 81 y.o. male.  81 year old male presents with BLE pitting edema, dyspnea, abdominal swelling, and profuse watery diarrhea since hospital admission 6/9-13.  He was hospitalized at [facility] for bilateral lower extremity weakness, numbness and tingling, pancytopenia, and SIRS.  PMH includes HTN, HLD, T2DM, possible chronic diastolic heart failure, paroxysmal A-fib s/p ablation x2 currently on Eliquis, spinal stenosis, CKD 3b.   Pitting edema, dyspnea, abdominal swelling: Patient reports this all started during his hospitalization last week. He reports he gained about 20 pounds "because of all the fluid they gave me". He has not been functioning well at home, per his report has been too weak to walk or raise up from bedside commode. His son has been living with him since discharge to help. Patient reports he has been in bed for the last five days, only scooting to transfer from bed to bedside commode and back again. Per PCP follow-up note yesterday, patient has been holding torsemide since discharge, just cleared to restart by PCP yesterday.  Patient himself denies any history of heart failure, although this is listed in his chart. Last echo 10/07/2018 demonstrates EF 50-55%, mild mitral annular calcification, right bowing of intra-atrial septum (suggestive of elevated left atrial pressure).  Patient is also s/p A-fib ablation x2, 03/12/2019 and 04/29/20.  Watery diarrhea: Profuse watery diarrhea 5-6 episodes per day since hospital discharge 6/13.   Home Medications Prior to Admission medications   Medication Sig Start Date End Date Taking? Authorizing Provider  albuterol (VENTOLIN HFA) 108 (90 Base) MCG/ACT inhaler Inhale 1-2 puffs into the lungs every 6 (six) hours as needed for  wheezing or shortness of breath. 10/05/20   Parrett, Fonnie Mu, NP  allopurinol (ZYLOPRIM) 300 MG tablet Take 1 tablet (300 mg total) by mouth daily. 07/18/21   Isaac Bliss, [name], MD  atorvastatin (LIPITOR) 20 MG tablet TAKE 1 TABLET DAILY Patient taking differently: Take 20 mg by mouth daily. 06/14/21   Isaac Bliss, [name], MD  carvedilol (COREG) 12.5 MG tablet Take 1 tablet (12.5 mg total) by mouth 2 (two) times daily. Patient taking differently: Take 12.5 mg by mouth every evening. 06/27/21   Richardo Priest, MD  colchicine 0.6 MG tablet Take 1 tablet (0.6 mg total) by mouth 2 (two) times daily as needed. 06/23/21   Richardo Priest, MD  doxycycline (VIBRAMYCIN) 100 MG capsule Take 1 capsule (100 mg total) by mouth 2 (two) times daily for 7 days. 08/08/21 08/15/21  Mercy Riding, MD  ELIQUIS 5 MG TABS tablet TAKE 1 TABLET TWICE A DAY Patient taking differently: Take 5 mg by mouth 2 (two) times daily. 06/15/21   Isaac Bliss, [name], MD  flecainide (TAMBOCOR) 100 MG tablet Take 1 tablet (100 mg total) by mouth 2 (two) times daily. 05/11/21   Camnitz, Will Hassell Done, MD  fluticasone (FLONASE) 50 MCG/ACT nasal spray Place 2 sprays into both nostrils daily. Patient taking differently: Place 2 sprays into both nostrils daily as needed for allergies. 01/07/19   Dana Allan I, MD  fluticasone furoate-vilanterol (BREO ELLIPTA) 100-25 MCG/INH AEPB Inhale 1 puff into the lungs daily. Patient taking differently: Inhale 1 puff into the lungs daily as needed (SOB). 10/05/20   Parrett, Fonnie Mu, NP  gabapentin (NEURONTIN) 300 MG capsule  TAKE 2 CAPSULES BY MOUTH AT BEDTIME Patient taking differently: Take 600 mg by mouth at bedtime. 04/17/21   Isaac Bliss, [name], MD  glucose blood (ACCU-CHEK AVIVA PLUS) test strip 1 each by Other route daily. Dx E11.9 03/25/20   Isaac Bliss, [name], MD  KLOR-CON M20 20 MEQ tablet Take 1 tablet (20 mEq total) by mouth daily. 08/15/21   Mercy Riding, MD   montelukast (SINGULAIR) 10 MG tablet Take 10 mg by mouth at bedtime.     [provider]  RESTASIS 0.05 % ophthalmic emulsion Place 1 drop into both eyes 2 (two) times daily. 12/09/19   [provider]  sodium bicarbonate 650 MG tablet Take 1 tablet (650 mg total) by mouth 3 (three) times daily for 15 doses. 08/08/21 08/13/21  Mercy Riding, MD  torsemide (DEMADEX) 20 MG tablet Take 1 tablet (20 mg total) by mouth daily. 08/15/21   Mercy Riding, MD  triamcinolone cream (KENALOG) 0.1 % Apply 1 application. topically 2 (two) times daily. Patient taking differently: Apply 1 application  topically daily as needed (itching). 07/18/21   Isaac Bliss, [name], MD  valsartan (DIOVAN) 160 MG tablet Take 1 tablet (160 mg total) by mouth daily. Patient must keep April 2023 appointment for further refills. 3 rd/ final attempt 08/15/21   Mercy Riding, MD     Allergies    Patient has no known allergies.    Review of Systems   Review of Systems  Constitutional:  Positive for activity change and fatigue. Negative for appetite change and chills.  Respiratory:  Positive for shortness of breath. Negative for cough, chest tightness and wheezing.   Cardiovascular:  Negative for chest pain.  Gastrointestinal:  Positive for abdominal distention and diarrhea. Negative for abdominal pain, anal bleeding, blood in stool, nausea and vomiting.  Genitourinary:  Negative for difficulty urinating.  Neurological:  Positive for tremors and weakness.   Physical Exam Updated Vital Signs BP 138/83   Pulse 73   Temp 98.7 F (37.1 C) (Oral)   Resp 15   SpO2 98%  Physical Exam Vitals and nursing note reviewed.  Constitutional:      General: He is not in acute distress.    Appearance: He is normal weight. He is not ill-appearing, toxic-appearing or diaphoretic.  HENT:     Head: Normocephalic.  Cardiovascular:     Rate and Rhythm: Normal rate and regular rhythm.  Pulmonary:     Effort: Pulmonary  effort is normal.     Breath sounds: Normal breath sounds.  Abdominal:     General: Bowel sounds are normal.     Palpations: Abdomen is soft.     Tenderness: There is no abdominal tenderness. There is no guarding or rebound.  Musculoskeletal:        General: Swelling (3+ pitting edema of BLE) present.  Skin:    General: Skin is warm.     Capillary Refill: Capillary refill takes less than 2 seconds.  Neurological:     Mental Status: He is alert.    ED Results / Procedures / Treatments   Labs (all labs ordered are listed, but only abnormal results are displayed) Labs Reviewed  BASIC METABOLIC PANEL - Abnormal; Notable for the following components:      Result Value   CO2 20 (*)    Glucose, Bld 178 (*)    BUN 31 (*)    Creatinine, Ser 1.61 (*)    Calcium 8.7 (*)  GFR, Estimated 44 (*)    All other components within normal limits  CBC - Abnormal; Notable for the following components:   RBC 3.21 (*)    Hemoglobin 9.4 (*)    HCT 29.3 (*)    RDW 16.8 (*)    All other components within normal limits  BRAIN NATRIURETIC PEPTIDE - Abnormal; Notable for the following components:   B Natriuretic Peptide 478.5 (*)    All other components within normal limits  C DIFFICILE QUICK SCREEN W PCR REFLEX    URINALYSIS, ROUTINE W REFLEX MICROSCOPIC  TROPONIN I (HIGH SENSITIVITY)  TROPONIN I (HIGH SENSITIVITY)   EKG EKG Interpretation  Date/Time:  Friday August 11 2021 14:35:11 EDT Ventricular Rate:  78 PR Interval:  36 QRS Duration: 140 QT Interval:  394 QTC Calculation: 449 R Axis:   67 Text Interpretation: Sinus rhythm Short PR interval Nonspecific intraventricular conduction delay No significant change since last tracing Confirmed by Wandra Arthurs [phone]) on 08/11/2021 5:10:19 PM  Radiology DG Chest 2 View  Result Date: 08/11/2021 CLINICAL DATA:  Shortness of breath. Lower extremity swelling. Weakness. Pancytopenia. EXAM: CHEST - 2 VIEW COMPARISON:  08/04/2021 FINDINGS: The heart  size and mediastinal contours are within normal limits. Low lung volumes noted. Tiny right posterior pleural effusion or thickening noted on the lateral projection. Both lungs are otherwise clear. The visualized skeletal structures are unremarkable. IMPRESSION: Tiny right posterior pleural effusion versus thickening. No other acute findings. Electronically Signed   By: Marlaine Hind M.D.   On: 08/11/2021 15:11    Procedures Procedures   Medications Ordered in ED Medications  furosemide (LASIX) injection 40 mg (has no administration in time range)    ED Course/ Medical Decision Making/ A&P                           Medical Decision Making 81 year old male presents with worsening BLE pitting edema, dyspnea, profuse watery diarrhea, and weakness since his hospitalization here at [facility] 6/9-13.  He reports being unable to do anything but transfer from bed to bedside commode and back.   BMP and CBC unremarkable, BNP 478.  First troponin 8, follow-up currently in process. ECHO and C. difficile screening ordered.  Plan for admission to hospitalist, will need PT/OT and possibly SNF.   Amount and/or Complexity of Data Reviewed External Data Reviewed: labs. Labs: ordered.  Risk Prescription drug management.   Final Clinical Impression(s) / ED Diagnoses Final diagnoses:  None   Rx / DC Orders ED Discharge Orders     None      Ezequiel Essex, MD   Ezequiel Essex, MD 08/11/21 1806    Drenda Freeze, MD 08/11/21 2320

## 2021-08-11 NOTE — H&P (Signed)
History and Physical    Patient: Charles Lloyd. UUV:[id number] DOB: 11/30/1940 DOA: 08/11/2021 DOS: the patient was seen and examined on 08/11/2021 PCP: Isaac Bliss, [name], MD  Patient coming from: Home  Chief Complaint:  Chief Complaint  Patient presents with   Shortness of Breath   HPI: Charles Lloyd. is a 81 y.o. male with medical history significant of diabetes, chronic atrial fibrillation, heart failure with preserved ejection fraction, chronic anticoagulation with Eliquis, essential hypertension, type 2 diabetes, history of prostate cancer, hyperlipidemia, gout, history of skin cancer, history of recent mechanical fall in the hospital who was recently admitted and discharged only 3 days ago.  At time of discharge patient was discharged on Eliquis and torsemide.  During that hospitalization patient received IV fluid resuscitation.  He started having progressive shortness of breath worsening edema.  Patient came to the ER where he was seen and evaluated.  He appears to have evidence of fluid overload. proBNP was 478.  He is H&H and renal function remained stable.  Viral screen including COVID-19 and influenza were negative.  Chest x-ray however shows tiny right posterior pleural effusion possible CHF.  Patient is being admitted with possible acute on chronic diastolic heart failure as a result of fluid overload. Review of Systems: As mentioned in the history of present illness. All other systems reviewed and are negative. Past Medical History:  Diagnosis Date   Acute tracheobronchitis 01/05/2019   B12 deficiency    Basal cell carcinoma (BCC) of left side of nose 01/28/2018   Cancer ([place])    Cardiomyopathy ([place]) 03/10/2018   With chronic atrial fibrillation   CHF (congestive heart failure) ([place]) 04/16/2013   Functional class II, ejection fraction 35-40%  Formatting of this note might be different from the original. Functional class II, ejection fraction 35-40%   Chronic  anticoagulation    Chronic diastolic (congestive) heart failure ([place]) 04/16/2013   Functional class II, ejection fraction 35-40%  Last Assessment & Plan:  Clinically stable Volume well controlled meds reviewed   Chronic diastolic CHF (congestive heart failure) ([place]) 04/16/2013   Functional class II, ejection fraction 35-40%  Last Assessment & Plan:  Clinically stable Volume well controlled meds reviewed   Colon polyps 04/16/2013   Diabetic neuropathy (HCC)    Eosinophil count raised 02/17/2019   Essential hypertension 01/06/2010   Last Assessment & Plan:  Well controlled Continue med management   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  Steroid usage was discontinued several months ago.  Follow up with Dermatology as planned.   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   Last Assessment & Plan:  Id following ?ongoing abx managementy reported Request records   Infection of prosthetic right knee joint ([place])    Insomnia 03/04/2012   Grief with loss of wife 02/20/12  Last Assessment & Plan:  Improved sx  contineu xanax prn Se discussed   Lesion of liver 04/20/2019   -On cardiac CT 02/2019   Mild reactive airways disease    Neoplasm of prostate 04/16/2013   Neoplasm of prostate, malignant ([place]) 01/06/2010   [name] S/p radiation therapy   Last  Assessment & Plan:  Repeat psa today No urinary sx Pt reported fatigue/poor appetite   On amiodarone therapy 09/07/2013   On continuous oral anticoagulation 01/28/2018   Other activity(E029.9) 04/20/2013   Formatting of this note might be different from the original. Transthoracic  Echocardiogram-03/10/2013-[facility]: Normal left ventricular wall thickness and cavity size.  Global left ventricular systolic function is moderately reduced.  The estimated ejection fraction is 35-40%.  The left atrium is moderately enlarged.  The right atrium is mildly enlarged.  No significant valvular    Overweight (BMI 25.0-29.9) 10/22/2016   Persistent atrial fibrillation ([place]) 04/16/2013   Last Assessment & Plan:  Rate controlled Continue med management eliquis for stroke preventino  Formatting of this note might be different from the original.  Drug  HX Current Rx Pre-ABL inefficacy Pre-ABL intolerant Post-ABL inefficacy Post-ABL intolerant max dose/24h M/Y end comments  sotalol                  dofetilide                  flecainide                  propafenone                  am   S/P TKR (total knee replacement), bilateral    Secondary hypercoagulable state ([place]) 04/09/2019   Tinea cruris 04/16/2013   Type 2 diabetes mellitus with diabetic polyneuropathy, without long-term current use of insulin ([place]) 04/16/2013   Last Assessment & Plan:  Labs today Pt reports well controlled on ambulatory monitoring   Vitamin D deficiency 07/25/2018   Past Surgical History:  Procedure Laterality Date   ATRIAL FIBRILLATION ABLATION N/A 03/12/2019   Procedure: ATRIAL FIBRILLATION ABLATION;  Surgeon: [name], MD;  Location: [facility];  Service: Cardiovascular;  Laterality: N/A;   ATRIAL FIBRILLATION ABLATION N/A 04/29/2020   Procedure: ATRIAL FIBRILLATION ABLATION;  Surgeon: [name], MD;  Location: [facility];  Service: Cardiovascular;  Laterality: N/A;   CARDIAC ELECTROPHYSIOLOGY STUDY AND ABLATION     CARDIOVERSION     CARDIOVERSION N/A 10/10/2018   Procedure: CARDIOVERSION;  Surgeon: [name], MD;  Location: [facility] ENDOSCOPY;  Service: Cardiovascular;  Laterality: N/A;   CARDIOVERSION N/A 05/18/2020   Procedure: CARDIOVERSION;  Surgeon: [name], MD;  Location: [facility] ENDOSCOPY;  Service: Cardiovascular;  Laterality: N/A;   CARDIOVERSION N/A 12/09/2020   Procedure: CARDIOVERSION;  Surgeon: Lelon Perla, MD;  Location: [facility] ENDOSCOPY;  Service: Cardiovascular;  Laterality: N/A;   CHOLECYSTECTOMY     PROSTATECTOMY     REPLACEMENT TOTAL KNEE BILATERAL     Social History:  reports that he has never smoked. He has never used smokeless tobacco. He reports current alcohol use of about 14.0 standard drinks of alcohol per week. He reports that he does not use drugs.  No Known Allergies  Family History  Problem Relation Age of Onset   Cancer Mother    Depression Mother    Early death Mother    Cancer Father    Depression Father    Early death Father     Prior to Admission medications   Medication Sig Start Date End Date Taking? Authorizing Provider  allopurinol (ZYLOPRIM) 300 MG tablet Take 1 tablet (300 mg total) by mouth daily. Patient taking differently: Take 300 mg by mouth daily as needed (gout). 07/18/21  Yes Isaac Bliss, Mertzon  Y, MD  atorvastatin (LIPITOR) 20 MG tablet TAKE 1 TABLET DAILY Patient taking differently: Take 20 mg by mouth daily. 06/14/21  Yes Isaac Bliss, [name], MD  carvedilol (COREG) 12.5 MG tablet Take 1 tablet (12.5 mg total) by mouth 2 (two) times daily. Patient taking differently: Take 12.5 mg by mouth every evening. 06/27/21  Yes Richardo Priest, MD  doxycycline (VIBRAMYCIN) 100 MG capsule Take 1 capsule (100 mg total) by mouth 2 (two) times daily for 7 days. Patient taking differently: Take 100 mg by mouth 2 (two) times daily. Start date : 08/10/21 08/08/21 08/15/21 Yes Mercy Riding, MD  ELIQUIS 5 MG TABS tablet TAKE 1 TABLET TWICE A DAY Patient taking differently: Take 5 mg by mouth 2 (two) times daily. 06/15/21  Yes Isaac Bliss, [name], MD  flecainide (TAMBOCOR) 100 MG tablet Take 1 tablet (100 mg total) by mouth 2 (two) times daily. 05/11/21  Yes Camnitz, Will Hassell Done, MD   fluticasone (FLONASE) 50 MCG/ACT nasal spray Place 2 sprays into both nostrils daily. Patient taking differently: Place 2 sprays into both nostrils daily as needed for allergies. 01/07/19  Yes Dana Allan I, MD  fluticasone furoate-vilanterol (BREO ELLIPTA) 100-25 MCG/INH AEPB Inhale 1 puff into the lungs daily. Patient taking differently: Inhale 1 puff into the lungs daily as needed (SOB). 10/05/20  Yes Parrett, Tammy S, NP  gabapentin (NEURONTIN) 300 MG capsule TAKE 2 CAPSULES BY MOUTH AT BEDTIME Patient taking differently: Take 600 mg by mouth at bedtime. 04/17/21  Yes Erline Hau, MD  KLOR-CON M20 20 MEQ tablet Take 1 tablet (20 mEq total) by mouth daily. 08/15/21  Yes Mercy Riding, MD  montelukast (SINGULAIR) 10 MG tablet Take 10 mg by mouth at bedtime.    Yes [provider]  RESTASIS 0.05 % ophthalmic emulsion Place 1 drop into both eyes 2 (two) times daily as needed (dry eyes). 12/09/19  Yes [provider]  sodium bicarbonate 650 MG tablet Take 1 tablet (650 mg total) by mouth 3 (three) times daily for 15 doses. 08/08/21 08/13/21 Yes Mercy Riding, MD  torsemide (DEMADEX) 20 MG tablet Take 1 tablet (20 mg total) by mouth daily. 08/15/21  Yes Mercy Riding, MD  triamcinolone cream (KENALOG) 0.1 % Apply 1 application. topically 2 (two) times daily. Patient taking differently: Apply 1 application  topically daily as needed (itching). 07/18/21  Yes Isaac Bliss, [name], MD  valsartan (DIOVAN) 160 MG tablet Take 1 tablet (160 mg total) by mouth daily. Patient must keep April 2023 appointment for further refills. 3 rd/ final attempt Patient taking differently: Take 160 mg by mouth daily. 08/15/21  Yes Mercy Riding, MD  albuterol (VENTOLIN HFA) 108 (90 Base) MCG/ACT inhaler Inhale 1-2 puffs into the lungs every 6 (six) hours as needed for wheezing or shortness of breath. Patient not taking: Reported on 08/11/2021 10/05/20   Parrett, Fonnie Mu, NP  colchicine  0.6 MG tablet Take 1 tablet (0.6 mg total) by mouth 2 (two) times daily as needed. Patient not taking: Reported on 08/11/2021 06/23/21   Richardo Priest, MD  glucose blood (ACCU-CHEK AVIVA PLUS) test strip 1 each by Other route daily. Dx E11.9 03/25/20   Isaac Bliss, [name], MD    Physical Exam: Vitals:   08/11/21 1830 08/11/21 1845 08/11/21 1900 08/11/21 1915  BP: (!) 129/117  (!) 116/99 126/69  Pulse: 72 77 88 75  Resp: 17 17 (!) 8 20  Temp:  TempSrc:      SpO2: 94% 95% 94% 97%   Generally: Obese, stable, no acute distress HEENT: PERRL, no pallor, no jaundice Neck: Supple, no JVD, no lymphadenopathy Respiratory: Decreased air entry at the bases, bilateral Rales and rhonchi Cardiovascular: Irregularly irregular with controlled rate Abdomen: Soft, nontender, positive bowel sounds Extremities: 1+ pedal edema bilaterally Neuro: Nonfocal  Data Reviewed:  Patient has BUN of 31 and creatinine 1.61.  Glucose 178.  GFR 44.  BNP 478.  Troponin of 8.  Hemoglobin 9.4.  Chest x-ray showed tiny pleural effusion.  Patient being admitted for further work-up  Assessment and Plan:   #1 acute on chronic diastolic heart failure: Most likely due to recent fluid given in the hospital.  Patient may have become fluid overloaded.  We will admit the patient for observation and diuresis.  Oxygen as needed.  #2 essential hypertension: Continue home regimen.  Diuresis with Lasix and hold torsemide  #3 gout: Continue with colchicine and other home regimen.  #4 chronic kidney disease stage IIIb: Appears to be at baseline.  Continue to monitor  #5 type 2 diabetes: We will initiate sliding scale insulin.  Continue to monitor  #6 paroxysmal atrial fibrillation: Continue Eliquis and rate control.  #7 hyperlipidemia: Continue statin  #8 history of prostate cancer: Continue monitoring.     Advance Care Planning:   Code Status: Prior full code  Consults: None  Family Communication: No family  at bedside  Severity of Illness: The appropriate patient status for this patient is INPATIENT. Inpatient status is judged to be reasonable and necessary in order to provide the required intensity of service to ensure the patient's safety. The patient's presenting symptoms, physical exam findings, and initial radiographic and laboratory data in the context of their chronic comorbidities is felt to place them at high risk for further clinical deterioration. Furthermore, it is not anticipated that the patient will be medically stable for discharge from the hospital within 2 midnights of admission.   * I certify that at the point of admission it is my clinical judgment that the patient will require inpatient hospital care spanning beyond 2 midnights from the point of admission due to high intensity of service, high risk for further deterioration and high frequency of surveillance required.*  AuthorBarbette Merino, MD 08/11/2021 8:03 PM  For on call review [email or web address].

## 2021-08-11 NOTE — ED Triage Notes (Addendum)
Patient BIB son, recent d/c after admission for pancytopenia. Son reports decline since d/c. Increased weakness, SOB on exertion, swelling to bilateral lower extremities, and incontinence. HX CHF.

## 2021-08-11 NOTE — Telephone Encounter (Addendum)
Don OT with [facility] is calling and needs verbal order for OT 1x2, 0x1, 2x1, 1x1 for adls, iadl, transfers and exercise

## 2021-08-11 NOTE — Telephone Encounter (Signed)
Charles Lloyd from [place] call and stated he need a verbal order for a Social worker because pt want to go to a skill nursing facility he stated pt wt yesterday at the office was 194 and Charles Lloyd't remember the point and today 's wt is 191.4.Physical Therapist disclose yesterday wt was 180 but pt stated that was his wt normal wt before he went to the hospital .

## 2021-08-11 NOTE — ED Provider Triage Note (Signed)
Emergency Medicine Provider Triage Evaluation Note  [name] , a 81 y.o. male  was evaluated in triage.  Pt complains of shortness of breath.  Patient was recently discharged from the hospital on Tuesday.  Patient has no strength in his legs and his fall and his legs giving out.  He is also noticed a 12 pound weight gain since discharge.  Patient restarted a fluid pill yesterday.  No chest pain.  Review of Systems  Positive:  Negative: See above  Physical Exam  BP 110/61 (BP Location: Left Arm)   Pulse 78   Temp 98.7 F (37.1 C) (Oral)   Resp 17   SpO2 99%  Gen:   Awake, no distress   Resp:  Normal effort  MSK:   Moves extremities without difficulty  Other:  1+ pitting edema bilaterally up to the knees.  Medical Decision Making  Medically screening exam initiated at 2:51 PM.  Appropriate orders placed.  [name] was informed that the remainder of the evaluation will be completed by another provider, this initial triage assessment does not replace that evaluation, and the importance of remaining in the ED until their evaluation is complete.     Myna Bright [place], [place] 08/11/21 1451

## 2021-08-11 NOTE — ED Notes (Signed)
ED TO INPATIENT HANDOFF REPORT  ED Nurse Name and Phone #: Elpidio Eric [phone]  S Name/Age/Gender [name] 81 y.o. male Room/Bed: WA24/WA24  Code Status   Code Status: Prior  Home/SNF/Other Home Patient oriented to: self, place, time, and situation Is this baseline? Yes   Triage Complete: Triage complete  Chief Complaint Acute exacerbation of CHF (congestive heart failure) ([place]) [I50.9]  Triage Note Patient BIB son, recent d/c after admission for pancytopenia. Son reports decline since d/c. Increased weakness, SOB on exertion, swelling to bilateral lower extremities, and incontinence. HX CHF.   Allergies No Known Allergies  Level of Care/Admitting Diagnosis ED Disposition     ED Disposition  Admit   Condition  --   Comment  Hospital Area: [place] [[id number]]  Level of Care: Telemetry [5]  Admit to tele based on following criteria: Acute CHF  May admit patient to [facility] or [facility] if equivalent level of care is available:: Yes  Covid Evaluation: Asymptomatic - no recent exposure (last 10 days) testing not required  Diagnosis: Acute exacerbation of CHF (congestive heart failure) ([place]) [[id number]]  Admitting Physician: Elwyn Reach [[id number]]  Attending Physician: Elwyn Reach [[id number]]  Estimated length of stay: past midnight tomorrow  Certification:: I certify this patient will need inpatient services for at least 2 midnights          B Medical/Surgery History Past Medical History:  Diagnosis Date   Acute tracheobronchitis 01/05/2019   B12 deficiency    Basal cell carcinoma (BCC) of left side of nose 01/28/2018   Cancer ([place])    Cardiomyopathy ([place]) 03/10/2018   With chronic atrial fibrillation   CHF (congestive heart failure) ([place]) 04/16/2013   Functional class II, ejection fraction 35-40%  Formatting of this note might be different from the original. Functional class II, ejection fraction 35-40%   Chronic  anticoagulation    Chronic diastolic (congestive) heart failure ([place]) 04/16/2013   Functional class II, ejection fraction 35-40%  Last Assessment & Plan:  Clinically stable Volume well controlled meds reviewed   Chronic diastolic CHF (congestive heart failure) ([place]) 04/16/2013   Functional class II, ejection fraction 35-40%  Last Assessment & Plan:  Clinically stable Volume well controlled meds reviewed   Colon polyps 04/16/2013   Diabetic neuropathy ([place])    Eosinophil count raised 02/17/2019   Essential hypertension 01/06/2010   Last Assessment & Plan:  Well controlled Continue med management   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  Steroid usage was discontinued several months ago.  Follow up with Dermatology as planned.   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   Last Assessment & Plan:  Id following ?ongoing abx managementy reported Request records   Infection of prosthetic right knee joint ([place])    Insomnia 03/04/2012   Grief with loss of wife 02/20/12  Last Assessment & Plan:  Improved sx  contineu xanax prn Se discussed   Lesion of liver 04/20/2019   -On cardiac CT 02/2019   Mild reactive airways disease    Neoplasm of prostate 04/16/2013   Neoplasm of prostate, 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 Transthoracic  Echocardiogram-03/10/[facility]: Normal left ventricular wall thickness and cavity size.  Global left ventricular systolic function is moderately reduced.  The estimated ejection fraction is 35-40%.  The left atrium is moderately enlarged.  The right atrium is mildly enlarged.  No significant valvular    Overweight (BMI 25.0-29.9) 10/22/2016   Persistent atrial fibrillation ([place]) 04/16/2013   Last Assessment & Plan:  Rate controlled Continue med management eliquis for stroke preventino  Formatting of this note might be different from the original.  Drug  HX Current Rx Pre-ABL inefficacy Pre-ABL intolerant Post-ABL inefficacy Post-ABL intolerant max dose/24h M/Y end comments  sotalol                  dofetilide                  flecainide                  propafenone                  am   S/P TKR (total knee replacement), bilateral    Secondary hypercoagulable state ([place]) 04/09/2019   Tinea cruris 04/16/2013   Type 2 diabetes mellitus with diabetic polyneuropathy, without long-term current use of insulin ([place]) 04/16/2013   Last Assessment & Plan:  Labs today Pt reports well controlled on ambulatory monitoring   Vitamin D deficiency 07/25/2018   Past Surgical History:  Procedure Laterality Date   ATRIAL FIBRILLATION ABLATION N/A 03/12/2019   Procedure: ATRIAL FIBRILLATION ABLATION;  Surgeon: [name], MD;  Location: [facility];  Service: Cardiovascular;  Laterality: N/A;   ATRIAL FIBRILLATION ABLATION N/A 04/29/2020   Procedure: ATRIAL FIBRILLATION ABLATION;  Surgeon: [name], MD;  Location: [facility];  Service: Cardiovascular;  Laterality: N/A;   CARDIAC ELECTROPHYSIOLOGY STUDY AND ABLATION     CARDIOVERSION     CARDIOVERSION N/A 10/10/2018   Procedure: CARDIOVERSION;  Surgeon: [name], MD;  Location: [facility] ENDOSCOPY;  Service: Cardiovascular;  Laterality: N/A;   CARDIOVERSION N/A 05/18/2020   Procedure: CARDIOVERSION;  Surgeon: [name], MD;  Location: [facility] ENDOSCOPY;  Service: Cardiovascular;  Laterality: N/A;   CARDIOVERSION N/A 12/09/2020   Procedure: CARDIOVERSION;  Surgeon: Lelon Perla, MD;  Location: [facility] ENDOSCOPY;  Service: Cardiovascular;  Laterality: N/A;   CHOLECYSTECTOMY     PROSTATECTOMY     REPLACEMENT TOTAL KNEE BILATERAL       A IV Location/Drains/Wounds Patient Lines/Drains/Airways Status     Active Line/Drains/Airways     Name Placement date Placement time Site Days   Peripheral IV 08/11/21 20 G 1" Posterior;Right Forearm 08/11/21  1800  Forearm  less than 1   Wound / Incision (Open or Dehisced) 12/30/20 Skin tear Arm Lower;Posterior;Proximal;Right skin tear 12/30/20  2100  Arm  224   Wound / Incision (Open or Dehisced) 08/05/21 Arm Left;Lateral;Medial abrasion noted to left lateral arm near elbow/gauze dressing applied per ED 08/05/21  0330  Arm  6            Intake/Output Last 24 hours No intake or output data in the 24 hours ending 08/11/21 2132  Labs/Imaging Results  for orders placed or performed during the hospital encounter of 08/11/21 (from the past 48 hour(s))  Basic metabolic panel     Status: Abnormal   Collection Time: 08/11/21  2:49 PM  Result Value Ref Range   Sodium 135 135 - 145 mmol/L   Potassium 4.6 3.5 - 5.1 mmol/L   Chloride 107 98 - 111 mmol/L   CO2 20 (L) 22 - 32 mmol/L   Glucose, Bld 178 (H) 70 - 99 mg/dL    Comment: Glucose reference range applies only to samples taken after fasting for at least 8 hours.   BUN 31 (H) 8 - 23 mg/dL   Creatinine, Ser 1.61 (H) 0.61 - 1.24 mg/dL   Calcium 8.7 (L) 8.9 - 10.3 mg/dL   GFR, Estimated 44 (L) >60 mL/min    Comment: (NOTE) Calculated using the CKD-EPI Creatinine Equation (2021)    Anion gap 8 5 - 15    Comment: Performed at [facility], [place] [address] [place], [place] [address]  CBC     Status: Abnormal   Collection Time: 08/11/21  2:49 PM  Result Value Ref Range   WBC 6.0 4.0 - 10.5 K/uL    RBC 3.21 (L) 4.22 - 5.81 MIL/uL   Hemoglobin 9.4 (L) 13.0 - 17.0 g/dL   HCT 29.3 (L) 39.0 - 52.0 %   MCV 91.3 80.0 - 100.0 fL   MCH 29.3 26.0 - 34.0 pg   MCHC 32.1 30.0 - 36.0 g/dL   RDW 16.8 (H) 11.5 - 15.5 %   Platelets 202 150 - 400 K/uL   nRBC 0.0 0.0 - 0.2 %    Comment: Performed at [facility], [place] [address], [place], [place] [address]  Troponin I (High Sensitivity)     Status: None   Collection Time: 08/11/21  2:51 PM  Result Value Ref Range   Troponin I (High Sensitivity) 8 <18 ng/L    Comment: (NOTE) Elevated high sensitivity troponin I (hsTnI) values and significant  changes across serial measurements may suggest ACS but many other  chronic and acute conditions are known to elevate hsTnI results.  Refer to the "Links" section for chest pain algorithms and additional  guidance. Performed at [facility], [place] [address] [place], [place] [address]   Brain natriuretic peptide     Status: Abnormal   Collection Time: 08/11/21  2:51 PM  Result Value Ref Range   B Natriuretic Peptide 478.5 (H) 0.0 - 100.0 pg/mL    Comment: Performed at [facility], [place] [address], [place], [place] [address]  Resp Panel by RT-PCR (Flu A&B, Covid) Anterior Nasal Swab     Status: None   Collection Time: 08/11/21  6:08 PM   Specimen: Anterior Nasal Swab  Result Value Ref Range   SARS Coronavirus 2 by RT PCR NEGATIVE NEGATIVE    Comment: (NOTE) SARS-CoV-2 target nucleic acids are NOT DETECTED.  The SARS-CoV-2 RNA is generally detectable in upper respiratory specimens during the acute phase of infection. The lowest concentration of SARS-CoV-2 viral copies this assay can detect is 138 copies/mL. A negative result does not preclude SARS-Cov-2 infection and should not be used as the sole basis for treatment or other patient management decisions. A negative result may occur with  improper specimen collection/handling, submission of  specimen other than nasopharyngeal swab, presence of viral mutation(s) within the areas targeted by this assay, and inadequate number of viral copies(<138 copies/mL). A negative result must be  combined with clinical observations, patient history, and epidemiological information. The expected result is Negative.  Fact Sheet for Patients:  EntrepreneurPulse.com.au  Fact Sheet for Healthcare Providers:  IncredibleEmployment.be  This test is no t yet approved or cleared by the [place] FDA and  has been authorized for detection and/or diagnosis of SARS-CoV-2 by FDA under an Emergency Use Authorization (EUA). This EUA will remain  in effect (meaning this test can be used) for the duration of the COVID-19 declaration under Section 564(b)(1) of the Act, 21 U.S.C.section 360bbb-3(b)(1), unless the authorization is terminated  or revoked sooner.       Influenza A by PCR NEGATIVE NEGATIVE   Influenza B by PCR NEGATIVE NEGATIVE    Comment: (NOTE) The Xpert Xpress SARS-CoV-2/FLU/RSV plus assay is intended as an aid in the diagnosis of influenza from Nasopharyngeal swab specimens and should not be used as a sole basis for treatment. Nasal washings and aspirates are unacceptable for Xpert Xpress SARS-CoV-2/FLU/RSV testing.  Fact Sheet for Patients: EntrepreneurPulse.com.au  Fact Sheet for Healthcare Providers: IncredibleEmployment.be  This test is not yet approved or cleared by the [place] FDA and has been authorized for detection and/or diagnosis of SARS-CoV-2 by FDA under an Emergency Use Authorization (EUA). This EUA will remain in effect (meaning this test can be used) for the duration of the COVID-19 declaration under Section 564(b)(1) of the Act, 21 U.S.C. section 360bbb-3(b)(1), unless the authorization is terminated or revoked.  Performed at [facility], [place] [address] [place], [place] [address]   Troponin I (High Sensitivity)     Status: None   Collection Time: 08/11/21  6:17 PM  Result Value Ref Range   Troponin I (High Sensitivity) 7 <18 ng/L    Comment: (NOTE) Elevated high sensitivity troponin I (hsTnI) values and significant  changes across serial measurements may suggest ACS but many other  chronic and acute conditions are known to elevate hsTnI results.  Refer to the "Links" section for chest pain algorithms and additional  guidance. Performed at [facility], [place] [address], [place], [place] [address]   Urinalysis, Routine w reflex microscopic     Status: None   Collection Time: 08/11/21  8:05 PM  Result Value Ref Range   Color, Urine YELLOW YELLOW   APPearance CLEAR CLEAR   Specific Gravity, Urine 1.008 1.005 - 1.030   pH 5.0 5.0 - 8.0   Glucose, UA NEGATIVE NEGATIVE mg/dL   Hgb urine dipstick NEGATIVE NEGATIVE   Bilirubin Urine NEGATIVE NEGATIVE   Ketones, ur NEGATIVE NEGATIVE mg/dL   Protein, ur NEGATIVE NEGATIVE mg/dL   Nitrite NEGATIVE NEGATIVE   Leukocytes,Ua NEGATIVE NEGATIVE    Comment: Performed at [place] [address], [place], [place] [address]   DG Chest 2 View  Result Date: 08/11/2021 CLINICAL DATA:  Shortness of breath. Lower extremity swelling. Weakness. Pancytopenia. EXAM: CHEST - 2 VIEW COMPARISON:  08/04/2021 FINDINGS: The heart size and mediastinal contours are within normal limits. Low lung volumes noted. Tiny right posterior pleural effusion or thickening noted on the lateral projection. Both lungs are otherwise clear. The visualized skeletal structures are unremarkable. IMPRESSION: Tiny right posterior pleural effusion versus thickening. No other acute findings. Electronically Signed   By: Marlaine Hind M.D.   On: 08/11/2021 15:11    Pending Labs Unresulted Labs (From admission, onward)     Start     Ordered   08/11/21 1735  C Difficile Quick Screen w PCR reflex  (  C  Difficile quick screen w PCR reflex panel )  Once, for 24 hours,   URGENT       References:    CDiff Information Tool   08/11/21 1734   Signed and Held  Basic metabolic panel  Daily,   R     Comments: As Scheduled for 5 days    Signed and Held            Vitals/Pain Today's Vitals   08/11/21 1830 08/11/21 1845 08/11/21 1900 08/11/21 1915  BP: (!) 129/117  (!) 116/99 126/69  Pulse: 72 77 88 75  Resp: 17 17 (!) 8 20  Temp:      TempSrc:      SpO2: 94% 95% 94% 97%  PainSc:        Isolation Precautions No active isolations  Medications Medications  furosemide (LASIX) injection 40 mg (40 mg Intravenous Given 08/11/21 1837)    Mobility walks with person assist High fall risk   Focused Assessments    R Recommendations: See Admitting Provider Note  Report given to:   Additional Notes:

## 2021-08-12 ENCOUNTER — Inpatient Hospital Stay (HOSPITAL_COMMUNITY): Payer: Medicare Other

## 2021-08-12 DIAGNOSIS — R0609 Other forms of dyspnea: Secondary | ICD-10-CM | POA: Diagnosis not present

## 2021-08-12 DIAGNOSIS — I5033 Acute on chronic diastolic (congestive) heart failure: Secondary | ICD-10-CM | POA: Diagnosis not present

## 2021-08-12 LAB — GLUCOSE, CAPILLARY
Glucose-Capillary: 179 mg/dL — ABNORMAL HIGH (ref 70–99)
Glucose-Capillary: 155 mg/dL — ABNORMAL HIGH (ref 70–99)
Glucose-Capillary: 107 mg/dL — ABNORMAL HIGH (ref 70–99)
Glucose-Capillary: 211 mg/dL — ABNORMAL HIGH (ref 70–99)

## 2021-08-12 LAB — BASIC METABOLIC PANEL
Anion gap: 7 (ref 5–15)
BUN: 30 mg/dL — ABNORMAL HIGH (ref 8–23)
CO2: 22 mmol/L (ref 22–32)
Calcium: 8.2 mg/dL — ABNORMAL LOW (ref 8.9–10.3)
Chloride: 105 mmol/L (ref 98–111)
Creatinine, Ser: 1.52 mg/dL — ABNORMAL HIGH (ref 0.61–1.24)
GFR, Estimated: 47 mL/min — ABNORMAL LOW (ref >60)
Glucose, Bld: 161 mg/dL — ABNORMAL HIGH (ref 70–99)
Potassium: 3.8 mmol/L (ref 3.5–5.1)
Sodium: 134 mmol/L — ABNORMAL LOW (ref 135–145)

## 2021-08-12 LAB — URINALYSIS, ROUTINE W REFLEX MICROSCOPIC
Bilirubin Urine: NEGATIVE
Glucose, UA: NEGATIVE mg/dL
Hgb urine dipstick: NEGATIVE
Ketones, ur: NEGATIVE mg/dL
Leukocytes,Ua: NEGATIVE
Nitrite: NEGATIVE
Protein, ur: NEGATIVE mg/dL
Specific Gravity, Urine: 1.01 (ref 1.005–1.030)
pH: 5 (ref 5.0–8.0)

## 2021-08-12 LAB — ECHOCARDIOGRAM COMPLETE
Area-P 1/2: 4.46 cm2
Calc EF: 50.7 %
Height: 70 in
S' Lateral: 3.2 cm
Single Plane A2C EF: 51.5 %
Single Plane A4C EF: 48 %
Weight: 3037.06 oz

## 2021-08-12 MED ORDER — DOXYCYCLINE HYCLATE 100 MG PO TABS
100.0000 mg | ORAL_TABLET | Freq: Two times a day (BID) | ORAL | Status: DC
  Administered 2021-08-12 – 2021-08-16 (×9): 100 mg via ORAL
  Filled 2021-08-12 (×9): qty 1

## 2021-08-12 MED ORDER — PERFLUTREN LIPID MICROSPHERE
1.0000 mL | INTRAVENOUS | Status: AC | PRN
  Administered 2021-08-12: 3 mL via INTRAVENOUS

## 2021-08-12 NOTE — TOC Progression Note (Signed)
Transition of Care (TOC) - Progression Note    Patient Details  Name: Charles Lloyd. MRN: [id number] Date of Birth: 06/03/1940  Transition of Care [facility]) CM/SW Contact  Purcell Mouton, RN Phone Number: 08/12/2021, 10:18 AM  Clinical Narrative:     Pt from home alone. Pt agreed to see PT for eval. Pt states he is not sure what he will need at discharge. TOC will continue to follow.   Expected Discharge Plan: Home/Self Care Barriers to Discharge: No Barriers Identified  Expected Discharge Plan and Services Expected Discharge Plan: Home/Self Care     Post Acute Care Choice: Home Health, [place] Living arrangements for the past 2 months: Single Family Home                                       Social Determinants of Health (SDOH) Interventions    Readmission Risk Interventions    08/08/2021   10:00 AM  Readmission Risk Prevention Plan  Transportation Screening Complete  Medication Review ([place]) Complete  PCP or Specialist appointment within 3-5 days of discharge Complete  HRI or [place] Complete  SW Recovery Care/Counseling Consult Not Complete  [place] Not Applicable

## 2021-08-12 NOTE — Progress Notes (Signed)
PROGRESS NOTE    Charles Lloyd.  KCL:[id number] DOB: Jun 30, 1940 DOA: 08/11/2021 PCP: Isaac Bliss, [name], MD   Brief Narrative: 81 year old male recently admitted to the hospital and discharged 3 days ago for possible sepsis received IV fluids now readmitted with fluid overload bilateral lower extremity edema shortness of breath and elevated BNP.  His history significant for type 2 diabetes hypertension chronic atrial fibrillation diastolic heart failure, on Eliquis chronically, hyperlipidemia gout and skin cancer.  He reports he is very active prior to being admitted to the hospital last time.  He plays golf he moves around without difficulty.  He lives alone.  COVID-19 influenza were negative chest x-ray shows tiny right pleural effusion. 08/12/2021 while patient was on the floor he developed a temp of 103.3.  He denies nausea vomiting diarrhea cough.  He feels that his leg swelling has gone down from diuresis. Looking through his prior records he was admitted earlier this month with fever of 102.4 without a proven source of infection.  MRI of the lumbar spine showed severe L4-L5 spinal stenosis. Infectious work-up included blood culture urine culture respiratory virus panel COVID all were negative last admission.  Patient had leukopenia and thrombocytopenia discharged on doxycycline for a total of 10 days.  ID was consulted started patient on doxycycline and start broad-spectrum antibiotics.  Assessment & Plan:   Principal Problem:   Acute exacerbation of CHF (congestive heart failure) (HCC) Active Problems:   Paroxysmal A-fib (HCC)   Hyperlipidemia associated with type 2 diabetes mellitus ([place])   Neoplasm of prostate, malignant ([place])   Essential hypertension   Chronic kidney disease, stage 3b (HCC)   #1 fever of unclear etiology likely due to possible tickborne disease question anaplasmosis.  Patient was admitted to the hospital few days ago with fever and all the infectious  work-up were negative.  However his fever continued with lymphopenia and leukopenia and new thrombocytopenia which was suspicious for tickborne diseases and he was started on doxycycline with stable and improved white count and platelets.  Patient was home for 3 days.  Then he started to have lower extremity edema and shortness of breath and had diarrhea. He was due to finish doxycycline for a total of 10 days I will restart doxycycline.  He spiked a temperature today 103.3 he missed doxycycline dose last night and this morning. Will send stool for C. difficile and GI panel and blood cultures.  #2 acute on chronic diastolic heart failure patient presented with dyspnea on exertion shortness of breath lower extremity edema 3+ improved on diuresis.  His last echo on record is from 2021 another echo has been ordered during this admission by the ED.  3.  History of essential hypertension on Coreg 12.5 mg twice a day  4.  CKD stage IIIb stable monitor closely on diuresis  #5 type 2 diabetes diet controlled, on SSI Recent hemoglobin A1c is 5.6 CBG (last 3)  Recent Labs    08/11/21 2232 08/12/21 0830  GLUCAP 149* 179*     #6 paroxysmal atrial fibrillation on Eliquis and Tambocor followed by EP as an outpatient  #7 hyperlipidemia continue Lipitor 20 mg daily  #8 history of prostate cancer outpatient follow-up  #9 gout on allopurinol   Estimated body mass index is 27.24 kg/m as calculated from the following:   Height as of this encounter: '5\' 10"'$  (1.778 m).   Weight as of this encounter: 86.1 kg.  DVT prophylaxis: Eliquis Code Status: Full code Family Communication:  None at bedside Disposition Plan:  Status is: Inpatient Remains inpatient appropriate because: CHF fever unknown etiology   Consultants:  None  Procedures: None Antimicrobials: Doxycycline  Subjective: He is resting in bed he feels his edema in the lower extremities and breathing are better he reported diarrhea  yesterday he has not had anything to eat today denies nausea vomiting denies abdominal pain  Objective: Vitals:   08/12/21 0526 08/12/21 0614 08/12/21 0749 08/12/21 0939  BP: (!) 119/56  (!) 99/53 113/61  Pulse: 89  79 81  Resp: '19  18 16  '$ Temp: (!) 103.3 F (39.6 C) 100.2 F (37.9 C) 100 F (37.8 C) 99.4 F (37.4 C)  TempSrc: Oral Oral Oral Oral  SpO2: 93%  94% 94%  Weight:      Height:        Intake/Output Summary (Last 24 hours) at 08/12/2021 0958 Last data filed at 08/12/2021 0100 Gross per 24 hour  Intake 240 ml  Output 375 ml  Net -135 ml   Filed Weights   08/11/21 2146 08/12/21 0500  Weight: 86.1 kg 86.1 kg    Examination:  General exam: Appears in no acute distress Respiratory system: Diminished at the bases to auscultation. Respiratory effort normal. Cardiovascular system: S1 & S2 heard, RRR. No JVD, murmurs, rubs, gallops or clicks. No pedal edema. Gastrointestinal system: Abdomen is nondistended, soft and nontender. No organomegaly or masses felt. Normal bowel sounds heard. Central nervous system: Alert and oriented. No focal neurological deficits. Extremities: 1+ pitting edema Skin: No rashes, lesions or ulcers Psychiatry: Judgement and insight appear normal. Mood & affect appropriate.     Data Reviewed: I have personally reviewed following labs and imaging studies  CBC: Recent Labs  Lab 08/06/21 0150 08/07/21 0158 08/08/21 0423 08/10/21 1507 08/11/21 1449  WBC 2.9* 3.0* 3.1* 5.6 6.0  NEUTROABS 2.1 1.8  --  3.9  --   HGB 8.7* 8.8* 8.8* 9.7* 9.4*  HCT 26.3* 27.4* 26.7* 29.3* 29.3*  MCV 92.0 92.6 91.4 90.7 91.3  PLT 83* 82* 98* 168.0 967   Basic Metabolic Panel: Recent Labs  Lab 08/06/21 0150 08/07/21 0158 08/08/21 0423 08/10/21 1507 08/11/21 1449 08/12/21 0350  NA 135 134* 136 136 135 134*  K 3.2* 3.2* 4.0 4.7 4.6 3.8  CL 106 107 111 107 107 105  CO2 20* 18* 16* 21 20* 22  GLUCOSE 143* 114* 104* 158* 178* 161*  BUN 25* 27* 25* 25*  31* 30*  CREATININE 1.97* 1.85* 1.57* 1.61* 1.61* 1.52*  CALCIUM 7.7* 7.9* 8.4* 8.6 8.7* 8.2*  MG 1.7 2.0 2.0  --   --   --   PHOS 3.6 3.0 2.5  --   --   --    GFR: Estimated Creatinine Clearance: 41.4 mL/min (A) (by C-G formula based on SCr of 1.52 mg/dL (H)). Liver Function Tests: Recent Labs  Lab 08/06/21 0150 08/06/21 1122 08/07/21 0158 08/08/21 0423 08/10/21 1507  AST  --  75* 58*  --  33  ALT  --  33 32  --  29  ALKPHOS  --  68 59  --  73  BILITOT  --  1.2 1.1  --  0.9  PROT  --  5.3* 4.9*  --  5.7*  ALBUMIN 2.4* 2.6* 2.4* 2.3* 3.1*   No results for input(s): "LIPASE", "AMYLASE" in the last 168 hours. No results for input(s): "AMMONIA" in the last 168 hours. Coagulation Profile: No results for input(s): "INR", "PROTIME" in the last  168 hours. Cardiac Enzymes: Recent Labs  Lab 08/07/21 0158  CKTOTAL 244   BNP (last 3 results) No results for input(s): "PROBNP" in the last 8760 hours. HbA1C: No results for input(s): "HGBA1C" in the last 72 hours. CBG: Recent Labs  Lab 08/07/21 1630 08/07/21 2119 08/08/21 0720 08/11/21 2232 08/12/21 0830  GLUCAP 118* 123* 99 149* 179*   Lipid Profile: No results for input(s): "CHOL", "HDL", "LDLCALC", "TRIG", "CHOLHDL", "LDLDIRECT" in the last 72 hours. Thyroid Function Tests: No results for input(s): "TSH", "T4TOTAL", "FREET4", "T3FREE", "THYROIDAB" in the last 72 hours. Anemia Panel: No results for input(s): "VITAMINB12", "FOLATE", "FERRITIN", "TIBC", "IRON", "RETICCTPCT" in the last 72 hours. Sepsis Labs: No results for input(s): "PROCALCITON", "LATICACIDVEN" in the last 168 hours.  Recent Results (from the past 240 hour(s))  Urine Culture     Status: None   Collection Time: 08/04/21 10:10 AM   Specimen: In/Out Cath Urine  Result Value Ref Range Status   Specimen Description IN/OUT CATH URINE  Final   Special Requests NONE  Final   Culture   Final    NO GROWTH Performed at [facility], [address],  [place], [place] [address]    Report Status 08/06/2021 FINAL  Final  SARS Coronavirus 2 by RT PCR (hospital order, performed in [facility] hospital lab) *cepheid single result test* Anterior Nasal Swab     Status: None   Collection Time: 08/04/21 10:10 AM   Specimen: Anterior Nasal Swab  Result Value Ref Range Status   SARS Coronavirus 2 by RT PCR NEGATIVE NEGATIVE Final    Comment: (NOTE) SARS-CoV-2 target nucleic acids are NOT DETECTED.  The SARS-CoV-2 RNA is generally detectable in upper and lower respiratory specimens during the acute phase of infection. The lowest concentration of SARS-CoV-2 viral copies this assay can detect is 250 copies / mL. A negative result does not preclude SARS-CoV-2 infection and should not be used as the sole basis for treatment or other patient management decisions.  A negative result may occur with improper specimen collection / handling, submission of specimen other than nasopharyngeal swab, presence of viral mutation(s) within the areas targeted by this assay, and inadequate number of viral copies (<250 copies / mL). A negative result must be combined with clinical observations, patient history, and epidemiological information.  Fact Sheet for Patients:   https://www.patel.info/  Fact Sheet for Healthcare Providers: https://hall.com/  This test is not yet approved or  cleared by the [place] FDA and has been authorized for detection and/or diagnosis of SARS-CoV-2 by FDA under an Emergency Use Authorization (EUA).  This EUA will remain in effect (meaning this test can be used) for the duration of the COVID-19 declaration under Section 564(b)(1) of the Act, 21 U.S.C. section 360bbb-3(b)(1), unless the authorization is terminated or revoked sooner.  Performed at [facility], [place] [address], [place], [place] [address]   Blood Culture (routine x 2)     Status: None   Collection Time: 08/04/21  10:30 AM   Specimen: BLOOD  Result Value Ref Range Status   Specimen Description BLOOD BLOOD LEFT HAND  Final   Special Requests   Final    BOTTLES DRAWN AEROBIC AND ANAEROBIC Blood Culture results may not be optimal due to an inadequate volume of blood received in culture bottles   Culture   Final    NO GROWTH 5 DAYS Performed at [facility], [place] [address] [place], [place] [address]  Report Status 08/09/2021 FINAL  Final  Blood Culture (routine x 2)     Status: None   Collection Time: 08/04/21 10:40 AM   Specimen: BLOOD  Result Value Ref Range Status   Specimen Description BLOOD RIGHT ANTECUBITAL  Final   Special Requests   Final    BOTTLES DRAWN AEROBIC AND ANAEROBIC Blood Culture adequate volume   Culture   Final    NO GROWTH 5 DAYS Performed at [facility], [place] [address], [place], [place] [address]    Report Status 08/09/2021 FINAL  Final  Respiratory (~20 pathogens) panel by PCR     Status: None   Collection Time: 08/06/21  9:43 AM   Specimen: Nasopharyngeal Swab; Respiratory  Result Value Ref Range Status   Adenovirus NOT DETECTED NOT DETECTED Final   Coronavirus 229E NOT DETECTED NOT DETECTED Final    Comment: (NOTE) The Coronavirus on the Respiratory Panel, DOES NOT test for the novel  Coronavirus (2019 nCoV)    Coronavirus HKU1 NOT DETECTED NOT DETECTED Final   Coronavirus NL63 NOT DETECTED NOT DETECTED Final   Coronavirus OC43 NOT DETECTED NOT DETECTED Final   Metapneumovirus NOT DETECTED NOT DETECTED Final   Rhinovirus / Enterovirus NOT DETECTED NOT DETECTED Final   Influenza A NOT DETECTED NOT DETECTED Final   Influenza B NOT DETECTED NOT DETECTED Final   Parainfluenza Virus 1 NOT DETECTED NOT DETECTED Final   Parainfluenza Virus 2 NOT DETECTED NOT DETECTED Final   Parainfluenza Virus 3 NOT DETECTED NOT DETECTED Final   Parainfluenza Virus 4 NOT DETECTED NOT DETECTED Final   Respiratory Syncytial Virus NOT DETECTED NOT DETECTED Final    Bordetella pertussis NOT DETECTED NOT DETECTED Final   Bordetella Parapertussis NOT DETECTED NOT DETECTED Final   Chlamydophila pneumoniae NOT DETECTED NOT DETECTED Final   Mycoplasma pneumoniae NOT DETECTED NOT DETECTED Final    Comment: Performed at [facility], [place], [place], [place] [address]  C Difficile Quick Screen w PCR reflex     Status: None   Collection Time: 08/11/21  5:35 PM   Specimen: STOOL  Result Value Ref Range Status   C Diff antigen NEGATIVE NEGATIVE Final   C Diff toxin NEGATIVE NEGATIVE Final   C Diff interpretation No C. difficile detected.  Final    Comment: Performed at [facility], [place] [address], [place], [place] [address]  Resp Panel by RT-PCR (Flu A&B, Covid) Anterior Nasal Swab     Status: None   Collection Time: 08/11/21  6:08 PM   Specimen: Anterior Nasal Swab  Result Value Ref Range Status   SARS Coronavirus 2 by RT PCR NEGATIVE NEGATIVE Final    Comment: (NOTE) SARS-CoV-2 target nucleic acids are NOT DETECTED.  The SARS-CoV-2 RNA is generally detectable in upper respiratory specimens during the acute phase of infection. The lowest concentration of SARS-CoV-2 viral copies this assay can detect is 138 copies/mL. A negative result does not preclude SARS-Cov-2 infection and should not be used as the sole basis for treatment or other patient management decisions. A negative result may occur with  improper specimen collection/handling, submission of specimen other than nasopharyngeal swab, presence of viral mutation(s) within the areas targeted by this assay, and inadequate number of viral copies(<138 copies/mL). A negative result must be combined with clinical observations, patient history, and epidemiological information. The expected result is Negative.  Fact Sheet for Patients:  EntrepreneurPulse.com.au  Fact Sheet for Healthcare Providers:  IncredibleEmployment.be  This  test is no t yet approved or cleared by the [place] and  has been authorized for detection and/or diagnosis of SARS-CoV-2 by FDA under an Emergency Use Authorization (EUA). This EUA will remain  in effect (meaning this test can be used) for the duration of the COVID-19 declaration under Section 564(b)(1) of the Act, 21 U.S.C.section 360bbb-3(b)(1), unless the authorization is terminated  or revoked sooner.       Influenza A by PCR NEGATIVE NEGATIVE Final   Influenza B by PCR NEGATIVE NEGATIVE Final    Comment: (NOTE) The Xpert Xpress SARS-CoV-2/FLU/RSV plus assay is intended as an aid in the diagnosis of influenza from Nasopharyngeal swab specimens and should not be used as a sole basis for treatment. Nasal washings and aspirates are unacceptable for Xpert Xpress SARS-CoV-2/FLU/RSV testing.  Fact Sheet for Patients: EntrepreneurPulse.com.au  Fact Sheet for Healthcare Providers: IncredibleEmployment.be  This test is not yet approved or cleared by the [place] FDA and has been authorized for detection and/or diagnosis of SARS-CoV-2 by FDA under an Emergency Use Authorization (EUA). This EUA will remain in effect (meaning this test can be used) for the duration of the COVID-19 declaration under Section 564(b)(1) of the Act, 21 U.S.C. section 360bbb-3(b)(1), unless the authorization is terminated or revoked.  Performed at [facility], [place] [address], [place],  [address]          Radiology Studies: DG Chest 2 View  Result Date: 08/11/2021 CLINICAL DATA:  Shortness of breath. Lower extremity swelling. Weakness. Pancytopenia. EXAM: CHEST - 2 VIEW COMPARISON:  08/04/2021 FINDINGS: The heart size and mediastinal contours are within normal limits. Low lung volumes noted. Tiny right posterior pleural effusion or thickening noted on the lateral projection. Both lungs are otherwise clear. The visualized  skeletal structures are unremarkable. IMPRESSION: Tiny right posterior pleural effusion versus thickening. No other acute findings. Electronically Signed   By: Marlaine Hind M.D.   On: 08/11/2021 15:11        Scheduled Meds:  apixaban  5 mg Oral BID   atorvastatin  20 mg Oral Daily   carvedilol  12.5 mg Oral Daily   flecainide  100 mg Oral BID   furosemide  40 mg Intravenous BID   gabapentin  600 mg Oral QHS   insulin aspart  0-15 Units Subcutaneous TID WC   insulin aspart  0-5 Units Subcutaneous QHS   [START ON 08/15/2021] irbesartan  150 mg Oral Daily   montelukast  10 mg Oral QHS   potassium chloride SA  20 mEq Oral Daily   sodium bicarbonate  650 mg Oral TID   sodium chloride flush  3 mL Intravenous Q12H   Continuous Infusions:  sodium chloride       LOS: 1 day    Time spent: 38 min  Georgette Shell, MD 08/12/2021, 9:58 AM

## 2021-08-12 NOTE — Progress Notes (Signed)
   08/12/21 0526  Assess: MEWS Score  Temp (!) 103.3 F (39.6 C)  BP (!) 119/56  MAP (mmHg) 75  Pulse Rate 89  Resp 19  SpO2 93 %  O2 Device Room Air  Assess: MEWS Score  MEWS Temp 2  MEWS Systolic 0  MEWS Pulse 0  MEWS RR 0  MEWS LOC 0  MEWS Score 2  MEWS Score Color Yellow  Assess: if the MEWS score is Yellow or Red  Were vital signs taken at a resting state? Yes  Focused Assessment Change from prior assessment (see assessment flowsheet)  Does the patient meet 2 or more of the SIRS criteria? No  MEWS guidelines implemented *See Row Information* Yes  Treat  MEWS Interventions Escalated (See documentation below)  Pain Scale 0-10  Pain Score 0  Take Vital Signs  Increase Vital Sign Frequency  Yellow: Q 2hr X 2 then Q 4hr X 2, if remains yellow, continue Q 4hrs  Escalate  MEWS: Escalate Yellow: discuss with charge nurse/RN and consider discussing with provider and RRT  Notify: Provider  Provider Name/Title Babs Bertin, MD  Date Provider Notified 08/12/21  Time Provider Notified [phone]  Method of Notification Page  Notification Reason Change in status  Provider response No new orders  Date of Provider Response 08/12/21  Time of Provider Response 0536  Assess: SIRS CRITERIA  SIRS Temperature  1  SIRS Pulse 0  SIRS Respirations  0  SIRS WBC 0  SIRS Score Sum  1

## 2021-08-13 DIAGNOSIS — I5033 Acute on chronic diastolic (congestive) heart failure: Secondary | ICD-10-CM | POA: Diagnosis not present

## 2021-08-13 LAB — CBC
HCT: 25.3 % — ABNORMAL LOW (ref 39.0–52.0)
Hemoglobin: 8.2 g/dL — ABNORMAL LOW (ref 13.0–17.0)
MCH: 30 pg (ref 26.0–34.0)
MCHC: 32.4 g/dL (ref 30.0–36.0)
MCV: 92.7 fL (ref 80.0–100.0)
Platelets: 193 10*3/uL (ref 150–400)
RBC: 2.73 MIL/uL — ABNORMAL LOW (ref 4.22–5.81)
RDW: 16.7 % — ABNORMAL HIGH (ref 11.5–15.5)
WBC: 4.9 10*3/uL (ref 4.0–10.5)
nRBC: 0 % (ref 0.0–0.2)

## 2021-08-13 LAB — COMPREHENSIVE METABOLIC PANEL
ALT: 27 U/L (ref 0–44)
AST: 28 U/L (ref 15–41)
Albumin: 2.5 g/dL — ABNORMAL LOW (ref 3.5–5.0)
Alkaline Phosphatase: 66 U/L (ref 38–126)
Anion gap: 10 (ref 5–15)
BUN: 31 mg/dL — ABNORMAL HIGH (ref 8–23)
CO2: 21 mmol/L — ABNORMAL LOW (ref 22–32)
Calcium: 7.9 mg/dL — ABNORMAL LOW (ref 8.9–10.3)
Chloride: 106 mmol/L (ref 98–111)
Creatinine, Ser: 1.69 mg/dL — ABNORMAL HIGH (ref 0.61–1.24)
GFR, Estimated: 41 mL/min — ABNORMAL LOW (ref >60)
Glucose, Bld: 148 mg/dL — ABNORMAL HIGH (ref 70–99)
Potassium: 3.8 mmol/L (ref 3.5–5.1)
Sodium: 137 mmol/L (ref 135–145)
Total Bilirubin: 1.3 mg/dL — ABNORMAL HIGH (ref 0.3–1.2)
Total Protein: 5.2 g/dL — ABNORMAL LOW (ref 6.5–8.1)

## 2021-08-13 LAB — GLUCOSE, CAPILLARY
Glucose-Capillary: 151 mg/dL — ABNORMAL HIGH (ref 70–99)
Glucose-Capillary: 201 mg/dL — ABNORMAL HIGH (ref 70–99)
Glucose-Capillary: 231 mg/dL — ABNORMAL HIGH (ref 70–99)
Glucose-Capillary: 279 mg/dL — ABNORMAL HIGH (ref 70–99)

## 2021-08-13 LAB — CK: Total CK: 41 U/L — ABNORMAL LOW (ref 49–397)

## 2021-08-13 MED ORDER — PREDNISONE 20 MG PO TABS
40.0000 mg | ORAL_TABLET | Freq: Every day | ORAL | Status: DC
  Administered 2021-08-13 – 2021-08-14 (×2): 40 mg via ORAL
  Filled 2021-08-13 (×2): qty 2

## 2021-08-13 MED ORDER — TRAMADOL HCL 50 MG PO TABS: 50.0000 mg | ORAL_TABLET | Freq: Four times a day (QID) | ORAL | Status: DC | PRN

## 2021-08-13 MED ORDER — PREDNISONE 20 MG PO TABS: 40.0000 mg | ORAL_TABLET | Freq: Every day | ORAL | Status: DC

## 2021-08-13 NOTE — Progress Notes (Signed)
PROGRESS NOTE    [name]  TRV:[id number] DOB: [birth year] DOA: 08/11/2021 PCP: Isaac Bliss, [name], MD   Brief Narrative: 81 year old male recently admitted to the hospital and discharged 3 days ago for possible sepsis received IV fluids now readmitted with fluid overload bilateral lower extremity edema shortness of breath and elevated BNP.  His history significant for type 2 diabetes hypertension chronic atrial fibrillation diastolic heart failure, on Eliquis chronically, hyperlipidemia gout and skin cancer.  He reports he is very active prior to being admitted to the hospital last time.  He plays golf he moves around without difficulty.  He lives alone.  COVID-19 influenza were negative chest x-ray shows tiny right pleural effusion. 08/12/2021 while patient was on the floor he developed a temp of 103.3.  He denies nausea vomiting diarrhea cough.  He feels that his leg swelling has gone down from diuresis. Looking through his prior records he was admitted earlier this month with fever of 102.4 without a proven source of infection.  MRI of the lumbar spine showed severe L4-L5 spinal stenosis. Infectious work-up included blood culture urine culture respiratory virus panel COVID all were negative last admission.  Patient had leukopenia and thrombocytopenia discharged on doxycycline for a total of 10 days.  ID was consulted started patient on doxycycline and start broad-spectrum antibiotics.  Assessment & Plan:   Principal Problem:   Acute exacerbation of CHF (congestive heart failure) (HCC) Active Problems:   Paroxysmal A-fib (HCC)   Hyperlipidemia associated with type 2 diabetes mellitus ([place])   Neoplasm of prostate, malignant ([place])   Essential hypertension   Chronic kidney disease, stage 3b (HCC)   #1 fever of unclear etiology likely due to possible tickborne disease question anaplasmosis.  Patient was admitted to the hospital few days ago with fever and all the infectious  work-up were negative.  However his fever continued with lymphopenia and leukopenia and new thrombocytopenia which was suspicious for tickborne diseases and he was started on doxycycline with stable and improved white count and platelets.  Patient was home for 3 days.  Then he started to have lower extremity edema and shortness of breath and had diarrhea. He was due to finish doxycycline for a total of 10 days  He has not had a fever since restarting the doxycycline.  He has not had any bowel movement since admission.    #2 acute on chronic diastolic heart failure patient presented with dyspnea on exertion shortness of breath lower extremity edema 3+ improved on diuresis.  Echo this admission ejection fraction is 50% with normal low normal left ventricular function.  No regional wall motion abnormalities.   3.  History of essential hypertension on Coreg 12.5 mg twice a day blood pressure 124/68  4.  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   Weight as of this encounter: 85 kg.  DVT prophylaxis: Eliquis Code Status: Full code Family Communication: Discussed with son Disposition Plan:  Status is:  Inpatient Remains inpatient appropriate because: CHF fever unknown etiology   Consultants:  None  Procedures: None Antimicrobials: Doxycycline  Subjective:   Patient is sitting up in bed complains of being very weak even unable to sit up he has swelling of his both index finger of his upper extremities. Objective: Vitals:   08/12/21 2106 08/13/21 0458 08/13/21 0500 08/13/21 1139  BP: (!) 125/55 116/63  124/68  Pulse: 82 73  73  Resp: '19 18  16  '$ Temp: 99.7 F (37.6 C) 99 F (37.2 C)  98.9 F (37.2 C)  TempSrc: Oral Oral    SpO2: 98% 95%  96%  Weight:   85 kg   Height:        Intake/Output Summary (Last 24 hours) at 08/13/2021 1413 Last data filed at 08/13/2021 0900 Gross per 24 hour  Intake 240 ml  Output 1450 ml  Net -1210 ml    Filed Weights   08/11/21 2146 08/12/21 0500 08/13/21 0500  Weight: 86.1 kg 86.1 kg 85 kg    Examination: Swollen index finger of both hands  General exam: Appears in no acute distress Respiratory system: Diminished at the bases to auscultation. Respiratory effort normal. Cardiovascular system: S1 & S2 heard, RRR. No JVD, murmurs, rubs, gallops or clicks. No pedal edema. Gastrointestinal system: Abdomen is nondistended, soft and nontender. No organomegaly or masses felt. Normal bowel sounds heard. Central nervous system: Alert and oriented. No focal neurological deficits. Extremities: 1+ pitting edema Skin: No rashes, lesions or ulcers Psychiatry: Judgement and insight appear normal. Mood & affect appropriate.     Data Reviewed: I have personally reviewed following labs and imaging studies  CBC: Recent Labs  Lab 08/07/21 0158 08/08/21 0423 08/10/21 1507 08/11/21 1449 08/13/21 0353  WBC 3.0* 3.1* 5.6 6.0 4.9  NEUTROABS 1.8  --  3.9  --   --   HGB 8.8* 8.8* 9.7* 9.4* 8.2*  HCT 27.4* 26.7* 29.3* 29.3* 25.3*  MCV 92.6 91.4 90.7 91.3 92.7  PLT 82* 98* 168.0 202 620    Basic Metabolic Panel: Recent Labs  Lab 08/07/21 0158  08/08/21 0423 08/10/21 1507 08/11/21 1449 08/12/21 0350 08/13/21 0353  NA 134* 136 136 135 134* 137  K 3.2* 4.0 4.7 4.6 3.8 3.8  CL 107 111 107 107 105 106  CO2 18* 16* 21 20* 22 21*  GLUCOSE 114* 104* 158* 178* 161* 148*  BUN 27* 25* 25* 31* 30* 31*  CREATININE 1.85* 1.57* 1.61* 1.61* 1.52* 1.69*  CALCIUM 7.9* 8.4* 8.6 8.7* 8.2* 7.9*  MG 2.0 2.0  --   --   --   --   PHOS 3.0 2.5  --   --   --   --     GFR: Estimated Creatinine Clearance: 37.2 mL/min (A) (by C-G formula based on SCr of 1.69 mg/dL (H)). Liver Function Tests: Recent Labs  Lab 08/07/21 0158 08/08/21 0423 08/10/21 1507 08/13/21 0353  AST 58*  --  33 28  ALT 32  --  29 27  ALKPHOS 59  --  73 66  BILITOT 1.1  --  0.9 1.3*  PROT 4.9*  --  5.7* 5.2*  ALBUMIN 2.4*  2.3* 3.1* 2.5*    No results for input(s): "LIPASE", "AMYLASE" in the last 168 hours. No results for input(s): "AMMONIA" in the last 168 hours. Coagulation Profile: No results for input(s): "INR", "PROTIME" in the last 168 hours. Cardiac Enzymes: Recent Labs  Lab 08/07/21 0158  CKTOTAL 244    BNP (last 3 results) No results for input(s): "PROBNP" in the last 8760 hours. HbA1C: No results for input(s): "HGBA1C" in the last 72 hours. CBG: Recent Labs  Lab 08/12/21 1117 08/12/21 1621 08/12/21 2114 08/13/21 0739 08/13/21 1140  GLUCAP 155* 107* 211* 151* 201*    Lipid Profile: No results for input(s): "CHOL", "HDL", "LDLCALC", "TRIG", "CHOLHDL", "LDLDIRECT" in the last 72 hours. Thyroid Function Tests: No results for input(s): "TSH", "T4TOTAL", "FREET4", "T3FREE", "THYROIDAB" in the last 72 hours. Anemia Panel: No results for input(s): "VITAMINB12", "FOLATE", "FERRITIN", "TIBC", "IRON", "RETICCTPCT" in the last 72 hours. Sepsis Labs: No results for input(s): "PROCALCITON", "LATICACIDVEN" in the last 168 hours.  Recent Results (from the past 240 hour(s))  Urine Culture     Status: None   Collection Time: 08/04/21 10:10 AM    Specimen: In/Out Cath Urine  Result Value Ref Range Status   Specimen Description IN/OUT CATH URINE  Final   Special Requests NONE  Final   Culture   Final    NO GROWTH Performed at [facility], [address] [place], [place] [address]    Report Status 08/06/2021 FINAL  Final  SARS Coronavirus 2 by RT PCR (hospital order, performed in [facility] hospital lab) *cepheid single result test* Anterior Nasal Swab     Status: None   Collection Time: 08/04/21 10:10 AM   Specimen: Anterior Nasal Swab  Result Value Ref Range Status   SARS Coronavirus 2 by RT PCR NEGATIVE NEGATIVE Final    Comment: (NOTE) SARS-CoV-2 target nucleic acids are NOT DETECTED.  The SARS-CoV-2 RNA is generally detectable in upper and lower respiratory specimens during the acute phase of infection. The lowest concentration of SARS-CoV-2 viral copies this assay can detect is 250 copies / mL. A negative result does not preclude SARS-CoV-2 infection and should not be used as the sole basis for treatment or other patient management decisions.  A negative result may occur with improper specimen collection / handling, submission of specimen other than nasopharyngeal swab, presence of viral mutation(s) within the areas targeted by this assay, and inadequate number of viral copies (<250 copies / mL). A negative result must be combined with clinical observations, patient history, and epidemiological information.  Fact Sheet for Patients:   https://www.patel.info/  Fact Sheet for Healthcare Providers: https://hall.com/  This test is not yet approved or  cleared by the [place] FDA and has been authorized for detection and/or diagnosis of SARS-CoV-2 by FDA under an Emergency Use Authorization (EUA).  This EUA will remain in effect (meaning this test can be used) for the duration of the COVID-19 declaration under Section 564(b)(1) of the Act, 21 U.S.C. section  360bbb-3(b)(1), unless the authorization is terminated or revoked sooner.  Performed at [facility], [place] [address] [place], [place] [address]   Blood Culture (routine x 2)     Status: None   Collection Time: 08/04/21 10:30 AM   Specimen: BLOOD  Result Value Ref Range Status   Specimen Description BLOOD BLOOD LEFT HAND  Final   Special Requests   Final    BOTTLES DRAWN AEROBIC AND ANAEROBIC Blood Culture results may not be optimal  due to an inadequate volume of blood received in culture bottles   Culture   Final    NO GROWTH 5 DAYS Performed at [facility], [place] [address] [place], [place] [address]    Report Status 08/09/2021 FINAL  Final  Blood Culture (routine x 2)     Status: None   Collection Time: 08/04/21 10:40 AM   Specimen: BLOOD  Result Value Ref Range Status   Specimen Description BLOOD RIGHT ANTECUBITAL  Final   Special Requests   Final    BOTTLES DRAWN AEROBIC AND ANAEROBIC Blood Culture adequate volume   Culture   Final    NO GROWTH 5 DAYS Performed at [facility], [place] [address], [place], [place] [address]    Report Status 08/09/2021 FINAL  Final  Respiratory (~20 pathogens) panel by PCR     Status: None   Collection Time: 08/06/21  9:43 AM   Specimen: Nasopharyngeal Swab; Respiratory  Result Value Ref Range Status   Adenovirus NOT DETECTED NOT DETECTED Final   Coronavirus 229E NOT DETECTED NOT DETECTED Final    Comment: (NOTE) The Coronavirus on the Respiratory Panel, DOES NOT test for the novel  Coronavirus (2019 nCoV)    Coronavirus HKU1 NOT DETECTED NOT DETECTED Final   Coronavirus NL63 NOT DETECTED NOT DETECTED Final   Coronavirus OC43 NOT DETECTED NOT DETECTED Final   Metapneumovirus NOT DETECTED NOT DETECTED Final   Rhinovirus / Enterovirus NOT DETECTED NOT DETECTED Final   Influenza A NOT DETECTED NOT DETECTED Final   Influenza B NOT DETECTED NOT DETECTED Final   Parainfluenza Virus 1 NOT DETECTED NOT DETECTED Final    Parainfluenza Virus 2 NOT DETECTED NOT DETECTED Final   Parainfluenza Virus 3 NOT DETECTED NOT DETECTED Final   Parainfluenza Virus 4 NOT DETECTED NOT DETECTED Final   Respiratory Syncytial Virus NOT DETECTED NOT DETECTED Final   Bordetella pertussis NOT DETECTED NOT DETECTED Final   Bordetella Parapertussis NOT DETECTED NOT DETECTED Final   Chlamydophila pneumoniae NOT DETECTED NOT DETECTED Final   Mycoplasma pneumoniae NOT DETECTED NOT DETECTED Final    Comment: Performed at [facility], [place], [place], [place] [address]  C Difficile Quick Screen w PCR reflex     Status: None   Collection Time: 08/11/21  5:35 PM   Specimen: STOOL  Result Value Ref Range Status   C Diff antigen NEGATIVE NEGATIVE Final   C Diff toxin NEGATIVE NEGATIVE Final   C Diff interpretation No C. difficile detected.  Final    Comment: Performed at [facility], [place] [address], [place], [place] [address]  Resp Panel by RT-PCR (Flu A&B, Covid) Anterior Nasal Swab     Status: None   Collection Time: 08/11/21  6:08 PM   Specimen: Anterior Nasal Swab  Result Value Ref Range Status   SARS Coronavirus 2 by RT PCR NEGATIVE NEGATIVE Final    Comment: (NOTE) SARS-CoV-2 target nucleic acids are NOT DETECTED.  The SARS-CoV-2 RNA is generally detectable in upper respiratory specimens during the acute phase of infection. The lowest concentration of SARS-CoV-2 viral copies this assay can detect is 138 copies/mL. A negative result does not preclude SARS-Cov-2 infection and should not be used as the sole basis for treatment or other patient management decisions. A negative result may occur with  improper specimen collection/handling, submission of specimen other than nasopharyngeal swab, presence of viral mutation(s) within the areas targeted by this assay, and inadequate number of  viral copies(<138 copies/mL). A negative result must be combined with clinical observations, patient history,  and epidemiological information. The expected result is Negative.  Fact Sheet for Patients:  EntrepreneurPulse.com.au  Fact Sheet for Healthcare Providers:  IncredibleEmployment.be  This test is no t yet approved or cleared by the [place] FDA and  has been authorized for detection and/or diagnosis of SARS-CoV-2 by FDA under an Emergency Use Authorization (EUA). This EUA will remain  in effect (meaning this test can be used) for the duration of the COVID-19 declaration under Section 564(b)(1) of the Act, 21 U.S.C.section 360bbb-3(b)(1), unless the authorization is terminated  or revoked sooner.       Influenza A by PCR NEGATIVE NEGATIVE Final   Influenza B by PCR NEGATIVE NEGATIVE Final    Comment: (NOTE) The Xpert Xpress SARS-CoV-2/FLU/RSV plus assay is intended as an aid in the diagnosis of influenza from Nasopharyngeal swab specimens and should not be used as a sole basis for treatment. Nasal washings and aspirates are unacceptable for Xpert Xpress SARS-CoV-2/FLU/RSV testing.  Fact Sheet for Patients: EntrepreneurPulse.com.au  Fact Sheet for Healthcare Providers: IncredibleEmployment.be  This test is not yet approved or cleared by the [place] FDA and has been authorized for detection and/or diagnosis of SARS-CoV-2 by FDA under an Emergency Use Authorization (EUA). This EUA will remain in effect (meaning this test can be used) for the duration of the COVID-19 declaration under Section 564(b)(1) of the Act, 21 U.S.C. section 360bbb-3(b)(1), unless the authorization is terminated or revoked.  Performed at [facility], [place] [address] [place], [place] [address]   Culture, blood (Routine X 2) w Reflex to ID Panel     Status: None (Preliminary result)   Collection Time: 08/12/21 10:39 AM   Specimen: Left Antecubital; Blood  Result Value Ref Range Status   Specimen  Description   Final    LEFT ANTECUBITAL Performed at [facility] [address] [place], [place] [address]    Special Requests   Final    BOTTLES DRAWN AEROBIC AND ANAEROBIC Blood Culture results may not be optimal due to an inadequate volume of blood received in culture bottles Performed at [facility] [address], [place], [place] [address]    Culture   Final    NO GROWTH < 24 HOURS Performed at [facility] [address] [place], [place] [address]    Report Status PENDING  Incomplete  Culture, blood (Routine X 2) w Reflex to ID Panel     Status: None (Preliminary result)   Collection Time: 08/12/21 10:39 AM   Specimen: BLOOD LEFT HAND  Result Value Ref Range Status   Specimen Description   Final    BLOOD LEFT HAND Performed at [facility] [address] [place], [place] [address]    Special Requests   Final    BOTTLES DRAWN AEROBIC AND ANAEROBIC Blood Culture adequate volume Performed at [facility] [address] [place], [place] [address]    Culture   Final    NO GROWTH < 24 HOURS Performed at [facility] [address] [place], [place] [address]    Report Status PENDING  Incomplete         Radiology Studies: ECHOCARDIOGRAM COMPLETE  Result Date: 08/12/2021    ECHOCARDIOGRAM REPORT   Patient Name:   Charles Lloyd. Date of Exam: 08/12/2021 Medical Rec #:  [id number]  Height:       70.0 in Accession #:    [id number]          Weight:       189.8 lb Date of Birth:  Apr 27, 1940           BSA:          2.041 m Patient Age:    30 years            BP:           113/61 mmHg Patient Gender: M                   HR:           81 bpm. Exam Location:  Inpatient Procedure: 2D Echo, Cardiac Doppler, Limited Color Doppler and Intracardiac            Opacification Agent Indications:    Dyspnea R06.00  History:        Patient has prior history of Echocardiogram examinations,  most                 recent 10/07/2018. CHF, Arrythmias:Atrial Fibrillation,                 Signs/Symptoms:Shortness of Breath; Risk Factors:Diabetes and                 Hypertension. Edema. Chronic kidney disease. Past history of                 cancer.  Sonographer:    Darlina Sicilian RDCS Referring Phys: [place]  1. Left ventricular ejection fraction, by estimation, is 50%. The left ventricle has low normal function. The left ventricle has no regional wall motion abnormalities. Left ventricular diastolic parameters are indeterminate.  2. Right ventricular systolic function is hyperdynamic. The right ventricular size is normal. There is normal pulmonary artery systolic pressure.  3. The mitral valve is normal in structure. Mild mitral valve regurgitation. No evidence of mitral stenosis.  4. The aortic valve is tricuspid. Aortic valve regurgitation is not visualized. Aortic valve sclerosis is present, with no evidence of aortic valve stenosis. Comparison(s): No significant change from prior study. FINDINGS  Left Ventricle: Left ventricular ejection fraction, by estimation, is 50%. The left ventricle has low normal function. The left ventricle has no regional wall motion abnormalities. Definity contrast agent was given IV to delineate the left ventricular endocardial borders. The left ventricular internal cavity size was normal in size. There is no left ventricular hypertrophy. Left ventricular diastolic parameters are indeterminate. Right Ventricle: The right ventricular size is normal. No increase in right ventricular wall thickness. Right ventricular systolic function is hyperdynamic. There is normal pulmonary artery systolic pressure. The tricuspid regurgitant velocity is 2.38 m/s, and with an assumed right atrial pressure of 3 mmHg, the estimated right ventricular systolic pressure is 95.2 mmHg. Left Atrium: Left atrial size was normal in size. Right Atrium: Right atrial size was  normal in size. Pericardium: There is no evidence of pericardial effusion. Mitral Valve: The mitral valve is normal in structure. Mild mitral valve regurgitation. No evidence of mitral valve stenosis. Tricuspid Valve: The tricuspid valve is normal in structure. Tricuspid valve regurgitation is mild . No evidence of tricuspid stenosis. Aortic Valve: The aortic valve is tricuspid. Aortic valve regurgitation is not visualized. Aortic valve sclerosis is present, with no evidence of aortic valve stenosis. Pulmonic Valve: The pulmonic valve was not well visualized. Pulmonic valve regurgitation is not  visualized. No evidence of pulmonic stenosis. Aorta: The aortic root and ascending aorta are structurally normal, with no evidence of dilitation. IAS/Shunts: No atrial level shunt detected by color flow Doppler.  LEFT VENTRICLE PLAX 2D LVIDd:         5.90 cm      Diastology LVIDs:         3.20 cm      LV e' medial:    4.90 cm/s LV PW:         0.70 cm      LV E/e' medial:  17.1 LV IVS:        0.70 cm      LV e' lateral:   7.29 cm/s LVOT diam:     2.40 cm      LV E/e' lateral: 11.5 LV SV:         62 LV SV Index:   30 LVOT Area:     4.52 cm  LV Volumes (MOD) LV vol d, MOD A2C: 149.0 ml LV vol d, MOD A4C: 183.0 ml LV vol s, MOD A2C: 72.3 ml LV vol s, MOD A4C: 95.2 ml LV SV MOD A2C:     76.7 ml LV SV MOD A4C:     183.0 ml LV SV MOD BP:      85.9 ml RIGHT VENTRICLE RV Basal diam:  3.95 cm RV Mid diam:    3.10 cm RV S prime:     17.80 cm/s TAPSE (M-mode): 2.7 cm LEFT ATRIUM             Index LA diam:        3.60 cm 1.76 cm/m LA Vol (A2C):   59.1 ml 28.95 ml/m LA Vol (A4C):   63.0 ml 30.86 ml/m LA Biplane Vol: 65.1 ml 31.89 ml/m  AORTIC VALVE LVOT Vmax:   78.30 cm/s LVOT Vmean:  54.500 cm/s LVOT VTI:    0.136 m  AORTA Ao Root diam: 3.60 cm Ao Asc diam:  3.50 cm MITRAL VALVE               TRICUSPID VALVE MV Area (PHT): 4.46 cm    TR Peak grad:   22.7 mmHg MV Decel Time: 170 msec    TR Vmax:        238.00 cm/s MV E velocity:  84.00 cm/s MV A velocity: 50.60 cm/s  SHUNTS MV E/A ratio:  1.66        Systemic VTI:  0.14 m                            Systemic Diam: 2.40 cm Rudean Haskell MD Electronically signed by Rudean Haskell MD Signature Date/Time: 08/12/2021/1:21:14 PM    Final    DG Chest 2 View  Result Date: 08/11/2021 CLINICAL DATA:  Shortness of breath. Lower extremity swelling. Weakness. Pancytopenia. EXAM: CHEST - 2 VIEW COMPARISON:  08/04/2021 FINDINGS: The heart size and mediastinal contours are within normal limits. Low lung volumes noted. Tiny right posterior pleural effusion or thickening noted on the lateral projection. Both lungs are otherwise clear. The visualized skeletal structures are unremarkable. IMPRESSION: Tiny right posterior pleural effusion versus thickening. No other acute findings. Electronically Signed   By: Marlaine Hind M.D.   On: 08/11/2021 15:11        Scheduled Meds:  apixaban  5 mg Oral BID   atorvastatin  20 mg Oral Daily   carvedilol  12.5 mg Oral Daily  doxycycline  100 mg Oral BID   flecainide  100 mg Oral BID   furosemide  40 mg Intravenous BID   gabapentin  600 mg Oral QHS   insulin aspart  0-15 Units Subcutaneous TID WC   insulin aspart  0-5 Units Subcutaneous QHS   [START ON 08/15/2021] irbesartan  150 mg Oral Daily   montelukast  10 mg Oral QHS   potassium chloride SA  20 mEq Oral Daily   predniSONE  40 mg Oral Q breakfast   sodium bicarbonate  650 mg Oral TID   sodium chloride flush  3 mL Intravenous Q12H   Continuous Infusions:  sodium chloride       LOS: 2 days    Time spent: 38 min  Georgette Shell, MD 08/13/2021, 2:13 PM

## 2021-08-13 NOTE — Progress Notes (Signed)
PT Cancellation Note  Patient Details Name: Charles Lloyd. MRN: [id number] DOB: [birth year]   Cancelled Treatment:      Pt up in recliner. After talking with pt for some time , he continued to not want to do anything today. Sounds very discouraged and hoping to learn some answers. Pt states his legs just don't work and are very painful when standing especially on bottom of his feet and ankles , feels like pins and needles. He doe not feel like moving or attempting to stand or walk at this time today.  Continue to encourage pt and explain what we can do and begin assessment to help understand what he is able to do and what he is not. He agree to let us assess him tomorrow.    Clide Dales 08/13/2021, 3:28 PM Gatha Mayer, PT, MPT Acute Rehabilitation Services Office: [phone] Pager: [phone] 08/13/2021

## 2021-08-14 ENCOUNTER — Telehealth: Payer: Self-pay | Admitting: Internal Medicine

## 2021-08-14 DIAGNOSIS — I5033 Acute on chronic diastolic (congestive) heart failure: Secondary | ICD-10-CM | POA: Diagnosis not present

## 2021-08-14 LAB — GASTROINTESTINAL PANEL BY PCR, STOOL (REPLACES STOOL CULTURE)

## 2021-08-14 LAB — CBC
HCT: 24.9 % — ABNORMAL LOW (ref 39.0–52.0)
Hemoglobin: 7.9 g/dL — ABNORMAL LOW (ref 13.0–17.0)
MCH: 28.8 pg (ref 26.0–34.0)
MCHC: 31.7 g/dL (ref 30.0–36.0)
MCV: 90.9 fL (ref 80.0–100.0)
Platelets: 235 10*3/uL (ref 150–400)
RBC: 2.74 MIL/uL — ABNORMAL LOW (ref 4.22–5.81)
RDW: 16.1 % — ABNORMAL HIGH (ref 11.5–15.5)
WBC: 4.9 10*3/uL (ref 4.0–10.5)
nRBC: 0 % (ref 0.0–0.2)

## 2021-08-14 LAB — COMPREHENSIVE METABOLIC PANEL
ALT: 28 U/L (ref 0–44)
AST: 25 U/L (ref 15–41)
Albumin: 2.5 g/dL — ABNORMAL LOW (ref 3.5–5.0)
Alkaline Phosphatase: 66 U/L (ref 38–126)
Anion gap: 8 (ref 5–15)
BUN: 40 mg/dL — ABNORMAL HIGH (ref 8–23)
CO2: 24 mmol/L (ref 22–32)
Calcium: 8.2 mg/dL — ABNORMAL LOW (ref 8.9–10.3)
Chloride: 105 mmol/L (ref 98–111)
Creatinine, Ser: 1.63 mg/dL — ABNORMAL HIGH (ref 0.61–1.24)
GFR, Estimated: 43 mL/min — ABNORMAL LOW (ref >60)
Glucose, Bld: 275 mg/dL — ABNORMAL HIGH (ref 70–99)
Potassium: 4.1 mmol/L (ref 3.5–5.1)
Sodium: 137 mmol/L (ref 135–145)
Total Bilirubin: 1.1 mg/dL (ref 0.3–1.2)
Total Protein: 5.3 g/dL — ABNORMAL LOW (ref 6.5–8.1)

## 2021-08-14 LAB — ROCKY MTN SPOTTED FVR ABS PNL(IGG+IGM)
RMSF IgG: NEGATIVE
RMSF IgM: 0.2 index (ref 0.00–0.89)

## 2021-08-14 LAB — LYME IGG/IGM
Lyme IgG EIA: UNDETERMINED
Lyme IgM EIA: NEGATIVE
Lyme Interpretation: DETECTED — AB

## 2021-08-14 LAB — LYME DISEASE SEROLOGY W/REFLEX: Lyme Total Antibody EIA: UNDETERMINED

## 2021-08-14 LAB — GLUCOSE, CAPILLARY
Glucose-Capillary: 275 mg/dL — ABNORMAL HIGH (ref 70–99)
Glucose-Capillary: 298 mg/dL — ABNORMAL HIGH (ref 70–99)
Glucose-Capillary: 259 mg/dL — ABNORMAL HIGH (ref 70–99)
Glucose-Capillary: 226 mg/dL — ABNORMAL HIGH (ref 70–99)

## 2021-08-14 LAB — SEDIMENTATION RATE: Sed Rate: 85 mm/hr — ABNORMAL HIGH (ref 0–16)

## 2021-08-14 MED ORDER — INSULIN ASPART 100 UNIT/ML IJ SOLN
4.0000 [IU] | Freq: Three times a day (TID) | INTRAMUSCULAR | Status: DC
  Administered 2021-08-14 – 2021-08-16 (×4): 4 [IU] via SUBCUTANEOUS

## 2021-08-14 MED ORDER — FUROSEMIDE 10 MG/ML IJ SOLN
40.0000 mg | Freq: Every day | INTRAMUSCULAR | Status: DC
  Administered 2021-08-15: 40 mg via INTRAVENOUS
  Filled 2021-08-14: qty 4

## 2021-08-14 MED ORDER — PREDNISONE 20 MG PO TABS
30.0000 mg | ORAL_TABLET | Freq: Every day | ORAL | Status: DC
  Administered 2021-08-15: 30 mg via ORAL
  Filled 2021-08-14: qty 1

## 2021-08-14 NOTE — NC FL2 (Signed)
[place] LEVEL OF CARE SCREENING TOOL     IDENTIFICATION  Patient Name: Charles Lloyd. Birthdate: 24-Oct-1940 Sex: male Admission Date (Current Location): 08/11/2021  [facility] and [place] Number:  Herbalist and Address:  [facility],  [address] [place], [place]      Provider Number: [id number]  Attending Physician Name and Address:  Georgette Shell, MD  Relative Name and Phone Number:  [place], Keady Son   [phone]    Current Level of Care: Hospital Recommended Level of Care: [place] Prior Approval Number:    Date Approved/Denied:   PASRR Number: [id number]  Discharge Plan: SNF    Current Diagnoses: Patient Active Problem List   Diagnosis Date Noted   Acute exacerbation of CHF (congestive heart failure) ([place]) 08/11/2021   Diarrhea 08/08/2021   FUO (fever of unknown origin) 08/06/2021   Hypokalemia 08/06/2021   Hypomagnesemia 08/06/2021   Hyperglycemia 08/05/2021   Pancytopenia ([place]) 08/05/2021   Fall at home, initial encounter 08/05/2021   Paresthesia of lower extremity 08/05/2021   Lower extremity weakness 08/05/2021   SIRS (systemic inflammatory response syndrome) ([place]) 08/04/2021   Thrombocytopenia ([place]) 08/04/2021   Chronic kidney disease, stage 3b ([place]) 12/30/2020   Multiple falls    Spinal stenosis    S/P TKR (total knee replacement), bilateral    Infection of prosthetic right knee joint ([place])    Cancer (HCC)    Arthralgia 11/20/2019   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   Lesion of liver 04/20/2019   Secondary hypercoagulable state ([place]) 04/09/2019   Eosinophil count raised 02/17/2019   Acute tracheobronchitis 01/05/2019   Asthma    Hyponatremia 12/17/2018   Vitamin D deficiency 07/25/2018   Cardiomyopathy ([place]) 03/10/2018   Chronic anticoagulation 03/06/2018   Hyperlipidemia associated with type 2 diabetes mellitus ([place]) 01/28/2018   Basal cell carcinoma (BCC) of left side  of nose 01/28/2018   On continuous oral anticoagulation 01/28/2018   Infected prosthetic knee joint ([place]) 06/24/2017   B12 deficiency 10/22/2016   Overweight (BMI 25.0-29.9) 10/22/2016   Primary osteoarthritis of right knee 03/29/2015   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   On amiodarone therapy 09/07/2013   Other activity(E029.9) 04/20/2013   Paroxysmal A-fib (HCC) 04/16/2013   Chronic diastolic (congestive) heart failure ([place]) 04/16/2013   CHF (congestive heart failure) ([place]) 04/16/2013   Colon polyps 04/16/2013   NIDDM-2 with polyneuropathy and hyperglycemia 04/16/2013   Tinea cruris 04/16/2013   Neoplasm of prostate 04/16/2013   Chronic diastolic CHF (congestive heart failure) ([place]) 04/16/2013   Diabetic neuropathy ([place]) 07/14/2012   Insomnia 03/04/2012   Hypertensive heart disease with heart failure ([place]) 01/06/2010   Neoplasm of prostate, malignant ([place]) 01/06/2010   Hypercholesterolemia 01/06/2010   Essential hypertension 01/06/2010    Orientation RESPIRATION BLADDER Height & Weight     Self, Time, Place  Normal Continent Weight: 83.7 kg Height:  '5\' 10"'$  (177.8 cm)  BEHAVIORAL SYMPTOMS/MOOD NEUROLOGICAL BOWEL NUTRITION STATUS      Continent Diet (Heart Healthy/Acrb Modified)  AMBULATORY STATUS COMMUNICATION OF NEEDS Skin   Extensive Assist Verbally Normal                       Personal Care Assistance Level of Assistance  Bathing, Feeding, Dressing Bathing Assistance: Independent Feeding assistance: Independent Dressing Assistance: Independent     Functional Limitations Info  Sight, Hearing, Speech Sight Info: Impaired Hearing Info: Adequate Speech Info: Adequate    SPECIAL CARE  FACTORS FREQUENCY  PT (By licensed PT), OT (By licensed OT)     PT Frequency: x5 week OT Frequency: x5 week            Contractures Contractures Info: Not present    Additional Factors Info  Code Status, Allergies, Insulin Sliding Scale Code Status Info: FULL Allergies Info:  No Known Allergies   Insulin Sliding Scale Info: See discharge summary. Pt on Novolog 0-15 units       Current Medications (08/14/2021):  This is the current hospital active medication list Current Facility-Administered Medications  Medication Dose Route Frequency Provider Last Rate Last Admin   0.9 %  sodium chloride infusion  250 mL Intravenous PRN Elwyn Reach, MD       acetaminophen (TYLENOL) tablet 650 mg  650 mg Oral Q4H PRN Elwyn Reach, MD   650 mg at 08/12/21 2124   albuterol (PROVENTIL) (2.5 MG/3ML) 0.083% nebulizer solution 3 mL  3 mL Inhalation Q6H PRN Elwyn Reach, MD       allopurinol (ZYLOPRIM) tablet 300 mg  300 mg Oral Daily PRN Elwyn Reach, MD       apixaban (ELIQUIS) tablet 5 mg  5 mg Oral BID Gala Romney L, MD   5 mg at 08/14/21 0846   atorvastatin (LIPITOR) tablet 20 mg  20 mg Oral Daily Gala Romney L, MD   20 mg at 08/14/21 0846   carvedilol (COREG) tablet 12.5 mg  12.5 mg Oral Daily Gala Romney L, MD   12.5 mg at 08/14/21 0846   cycloSPORINE (RESTASIS) 0.05 % ophthalmic emulsion 1 drop  1 drop Both Eyes BID PRN Elwyn Reach, MD       doxycycline (VIBRA-TABS) tablet 100 mg  100 mg Oral BID Georgette Shell, MD   100 mg at 08/14/21 0846   flecainide (TAMBOCOR) tablet 100 mg  100 mg Oral BID Gala Romney L, MD   100 mg at 08/14/21 0853   fluticasone (FLONASE) 50 MCG/ACT nasal spray 2 spray  2 spray Each Nare Daily PRN Gala Romney L, MD       fluticasone furoate-vilanterol (BREO ELLIPTA) 100-25 MCG/INH 1 puff  1 puff Inhalation Daily PRN Elwyn Reach, MD       [START ON 08/15/2021] furosemide (LASIX) injection 40 mg  40 mg Intravenous Daily Georgette Shell, MD       gabapentin (NEURONTIN) capsule 600 mg  600 mg Oral QHS Gala Romney L, MD   600 mg at 08/13/21 2221   insulin aspart (novoLOG) injection 0-15 Units  0-15 Units Subcutaneous TID WC Gala Romney L, MD   8 Units at 08/14/21 1251   insulin aspart  (novoLOG) injection 0-5 Units  0-5 Units Subcutaneous QHS Gala Romney L, MD   3 Units at 08/13/21 2223   [START ON 08/15/2021] irbesartan (AVAPRO) tablet 150 mg  150 mg Oral Daily Garba, Mohammad L, MD       montelukast (SINGULAIR) tablet 10 mg  10 mg Oral QHS Gala Romney L, MD   10 mg at 08/13/21 2221   ondansetron (ZOFRAN) injection 4 mg  4 mg Intravenous Q6H PRN Gala Romney L, MD       potassium chloride SA (KLOR-CON M) CR tablet 20 mEq  20 mEq Oral Daily Gala Romney L, MD   20 mEq at 08/14/21 0846   [START ON 08/15/2021] predniSONE (DELTASONE) tablet 30 mg  30 mg Oral Q breakfast Georgette Shell, MD  sodium bicarbonate tablet 650 mg  650 mg Oral TID Elwyn Reach, MD   650 mg at 08/14/21 0846   sodium chloride flush (NS) 0.9 % injection 3 mL  3 mL Intravenous Q12H Gala Romney L, MD   3 mL at 08/14/21 0848   sodium chloride flush (NS) 0.9 % injection 3 mL  3 mL Intravenous PRN Elwyn Reach, MD       traMADol Veatrice [place]bon) tablet 50 mg  50 mg Oral Q6H PRN Georgette Shell, MD       triamcinolone cream (KENALOG) 0.1 % cream 1 Application  1 Application Topical Daily PRN Elwyn Reach, MD         Discharge Medications: Please see discharge summary for a list of discharge medications.  Relevant Imaging Results:  Relevant Lab Results:   Additional Information SS#[id number]  Purcell Mouton, RN

## 2021-08-14 NOTE — TOC Progression Note (Signed)
Transition of Care (TOC) - Progression Note    Patient Details  Name: Charles Lloyd. MRN: [id number] Date of Birth: 1940/06/01  Transition of Care [facility]) CM/SW Contact  Purcell Mouton, RN Phone Number: 08/14/2021, 1:50 PM  Clinical Narrative:    Pt was faxed to SNF facilities in [place] to [place] per pt. Will call pt's son when there are bed offers.   Expected Discharge Plan: Home/Self Care Barriers to Discharge: No Barriers Identified  Expected Discharge Plan and Services Expected Discharge Plan: Home/Self Care     Post Acute Care Choice: Home Health, [place] Living arrangements for the past 2 months: Single Family Home                                       Social Determinants of Health (SDOH) Interventions    Readmission Risk Interventions    08/08/2021   10:00 AM  Readmission Risk Prevention Plan  Transportation Screening Complete  Medication Review ([place]) Complete  PCP or Specialist appointment within 3-5 days of discharge Complete  HRI or [place] Complete  SW Recovery Care/Counseling Consult Not Complete  [place] Not Applicable

## 2021-08-14 NOTE — Progress Notes (Signed)
PROGRESS NOTE    Charles Lloyd.  YCX:[id number] DOB: 03-30-1940 DOA: 08/11/2021 PCP: Isaac Bliss, [name], MD   Brief Narrative: 81 year old male recently admitted to the hospital and discharged 3 days ago for possible sepsis received IV fluids now readmitted with fluid overload bilateral lower extremity edema shortness of breath and elevated BNP.  His history significant for type 2 diabetes hypertension chronic atrial fibrillation diastolic heart failure, on Eliquis chronically, hyperlipidemia gout and skin cancer.  He reports he is very active prior to being admitted to the hospital last time.  He plays golf he moves around without difficulty.  He lives alone.  COVID-19 influenza were negative chest x-ray shows tiny right pleural effusion. 08/12/2021 while patient was on the floor he developed a temp of 103.3.  He denies nausea vomiting diarrhea cough.  He feels that his leg swelling has gone down from diuresis. Looking through his prior records he was admitted earlier this month with fever of 102.4 without a proven source of infection.  MRI of the lumbar spine showed severe L4-L5 spinal stenosis. Infectious work-up included blood culture urine culture respiratory virus panel COVID all were negative last admission.  Patient had leukopenia and thrombocytopenia discharged on doxycycline for a total of 10 days.  ID was consulted started patient on doxycycline and start broad-spectrum antibiotics.  Assessment & Plan:   Principal Problem:   Acute exacerbation of CHF (congestive heart failure) (HCC) Active Problems:   Paroxysmal A-fib (HCC)   Hyperlipidemia associated with type 2 diabetes mellitus (HCC)   Neoplasm of prostate, malignant ([place])   Essential hypertension   Chronic kidney disease, stage 3b ([place])   #1 fever of unclear etiology likely due to possible tickborne disease ?ehrlichiosis/anaplasmosis-patient was admitted to the hospital few days ago with fever and all the infectious  work-up were negative.  However his fever continued with lymphopenia and leukopenia and new thrombocytopenia which was suspicious for tickborne diseases and he was started on doxycycline with stable and improved white count and platelets.  Patient was home for 3 days.  Then he started to have lower extremity edema and shortness of breath and had diarrhea. He was due to finish doxycycline for a total of 10 days  He has not had a fever since restarting the doxycycline.  He has not had any bowel movement since admission. Continue doxycycline for a total of 10 days.  #2 acute on chronic diastolic heart failure patient presented with dyspnea on exertion shortness of breath lower extremity edema 3+ improved on diuresis.  Echo this admission ejection fraction is 50% with normal low normal left ventricular function.  No regional wall motion abnormalities.  Decrease Lasix to 40 mg daily.  3.  History of essential hypertension on Coreg 12.5 mg twice a day blood pressure 124/68  4.  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    #11 severe deconditioning and generalized weakness from #1 PT consult pending he will need to go to rehab  on discharge discussed with son  Estimated body mass index is 26.48 kg/m as calculated from the following:   Height as of this encounter: '5\' 10"'$  (1.778 m).   Weight as of this encounter: 83.7 kg.  DVT prophylaxis: Eliquis Code Status: Full code Family Communication: Discussed with  son Disposition Plan:  Status is: Inpatient Remains inpatient appropriate because: CHF fever unknown etiology   Consultants:  None  Procedures: None Antimicrobials: Doxycycline  Subjective: He feels better he wants to walk with therapy swelling is down he is able to move his fingers better  Objective: Vitals:   08/13/21 2047 08/13/21 2100 08/14/21 0416 08/14/21 0445  BP: 123/62 123/62 123/63   Pulse: 73 93 68   Resp: (!) 24 (!) 22 18   Temp: 98.3 F (36.8 C) 98.3 F (36.8 C) 98.3 F (36.8 C)   TempSrc:  Oral    SpO2: 99% 99% 96%   Weight:    83.7 kg  Height:        Intake/Output Summary (Last 24 hours) at 08/14/2021 1115 Last data filed at 08/14/2021 0400 Gross per 24 hour  Intake 240 ml  Output 900 ml  Net -660 ml    Filed Weights   08/12/21 0500 08/13/21 0500 08/14/21 0445  Weight: 86.1 kg 85 kg 83.7 kg    Examination: Swollen index finger of both hands  General exam: Appears in no acute distress Respiratory system: Diminished at the bases to auscultation. Respiratory effort normal. Cardiovascular system: S1 & S2 heard, RRR. No JVD, murmurs, rubs, gallops or clicks. No pedal edema. Gastrointestinal system: Abdomen is nondistended, soft and nontender. No organomegaly or masses felt. Normal bowel sounds heard. Central nervous system: Alert and oriented. No focal neurological deficits. Extremities: 1+ pitting edema decreased swelling to the index finger of both upper extremity Skin: No rashes, lesions or ulcers Psychiatry: Judgement and insight appear normal. Mood & affect appropriate.     Data Reviewed: I have personally reviewed following labs and imaging studies  CBC: Recent Labs  Lab 08/08/21 0423 08/10/21 1507 08/11/21 1449 08/13/21 0353 08/14/21 0331  WBC 3.1* 5.6 6.0 4.9 4.9  NEUTROABS  --  3.9  --   --   --   HGB 8.8* 9.7* 9.4* 8.2* 7.9*  HCT 26.7* 29.3* 29.3* 25.3* 24.9*  MCV 91.4 90.7 91.3 92.7 90.9  PLT 98* 168.0 202 193 235    Basic  Metabolic Panel: Recent Labs  Lab 08/08/21 0423 08/10/21 1507 08/11/21 1449 08/12/21 0350 08/13/21 0353 08/14/21 0331  NA 136 136 135 134* 137 137  K 4.0 4.7 4.6 3.8 3.8 4.1  CL 111 107 107 105 106 105  CO2 16* 21 20* 22 21* 24  GLUCOSE 104* 158* 178* 161* 148* 275*  BUN 25* 25* 31* 30* 31* 40*  CREATININE 1.57* 1.61* 1.61* 1.52* 1.69* 1.63*  CALCIUM 8.4* 8.6 8.7* 8.2* 7.9* 8.2*  MG 2.0  --   --   --   --   --   PHOS 2.5  --   --   --   --   --     GFR: Estimated Creatinine Clearance: 38.6 mL/min (A) (by C-G formula based on SCr of 1.63 mg/dL (H)). Liver Function Tests: Recent Labs  Lab 08/08/21 0423 08/10/21 1507 08/13/21 0353 08/14/21 0331  AST  --  33 28 25  ALT  --  '29 27 28  '$ ALKPHOS  --  73 66 66  BILITOT  --  0.9 1.3* 1.1  PROT  --  5.7* 5.2* 5.3*  ALBUMIN 2.3* 3.1* 2.5* 2.5*    No results for input(s): "LIPASE", "AMYLASE" in the last 168 hours. No results for input(s): "AMMONIA" in the last 168 hours. Coagulation Profile: No results for input(s): "INR", "PROTIME" in the last 168 hours. Cardiac Enzymes: Recent Labs  Lab 08/13/21 0427  CKTOTAL 41*    BNP (last 3 results) No results for input(s): "PROBNP" in the last 8760 hours. HbA1C: No results for input(s): "HGBA1C" in the last 72 hours. CBG: Recent Labs  Lab 08/13/21 0739 08/13/21 1140 08/13/21 1645 08/13/21 2040 08/14/21 0743  GLUCAP 151* 201* 231* 279* 275*    Lipid Profile: No results for input(s): "CHOL", "HDL", "LDLCALC", "TRIG", "CHOLHDL", "LDLDIRECT" in the last 72 hours. Thyroid Function Tests: No results for input(s): "TSH", "T4TOTAL", "FREET4", "T3FREE", "THYROIDAB" in the last 72 hours. Anemia Panel: No results for input(s): "VITAMINB12", "FOLATE", "FERRITIN", "TIBC", "IRON", "RETICCTPCT" in the last 72 hours. Sepsis Labs: No results for input(s): "PROCALCITON", "LATICACIDVEN" in the last 168 hours.  Recent Results (from the past 240 hour(s))  Respiratory (~20 pathogens)  panel by PCR     Status: None   Collection Time: 08/06/21  9:43 AM   Specimen: Nasopharyngeal Swab; Respiratory  Result Value Ref Range Status   Adenovirus NOT DETECTED NOT DETECTED Final   Coronavirus 229E NOT DETECTED NOT DETECTED Final    Comment: (NOTE) The Coronavirus on the Respiratory Panel, DOES NOT test for the novel  Coronavirus (2019 nCoV)    Coronavirus HKU1 NOT DETECTED NOT DETECTED Final   Coronavirus NL63 NOT DETECTED NOT DETECTED Final   Coronavirus OC43 NOT DETECTED NOT DETECTED Final   Metapneumovirus NOT DETECTED NOT DETECTED Final   Rhinovirus / Enterovirus NOT DETECTED NOT DETECTED Final   Influenza A NOT DETECTED NOT DETECTED Final   Influenza B NOT DETECTED NOT DETECTED Final   Parainfluenza Virus 1 NOT DETECTED NOT DETECTED Final   Parainfluenza Virus 2 NOT DETECTED NOT DETECTED Final   Parainfluenza Virus 3 NOT DETECTED NOT DETECTED Final   Parainfluenza Virus 4 NOT DETECTED NOT DETECTED Final   Respiratory Syncytial Virus NOT DETECTED NOT DETECTED Final   Bordetella pertussis NOT DETECTED NOT DETECTED Final   Bordetella Parapertussis NOT DETECTED NOT DETECTED Final   Chlamydophila pneumoniae NOT DETECTED NOT DETECTED Final   Mycoplasma pneumoniae NOT DETECTED NOT DETECTED Final    Comment: Performed at [facility], [place]. [address], [place], [place] [address]  C Difficile Quick Screen w PCR reflex     Status: None   Collection Time: 08/11/21  5:35 PM   Specimen: STOOL  Result Value Ref Range Status   C Diff antigen NEGATIVE NEGATIVE Final   C Diff toxin NEGATIVE NEGATIVE Final   C Diff interpretation No C. difficile detected.  Final    Comment: Performed at [facility], [place] [address], [place], [place] [address]  Resp Panel by RT-PCR (Flu A&B, Covid) Anterior Nasal Swab     Status: None   Collection Time: 08/11/21  6:08 PM   Specimen: Anterior Nasal Swab  Result Value Ref Range Status   SARS Coronavirus 2 by RT PCR NEGATIVE  NEGATIVE Final    Comment: (NOTE) SARS-CoV-2 target nucleic acids are NOT DETECTED.  The SARS-CoV-2 RNA is generally detectable in upper respiratory specimens during the acute phase of infection. The lowest concentration of SARS-CoV-2 viral copies this assay can detect is 138 copies/mL. A negative result does not preclude SARS-Cov-2  infection and should not be used as the sole basis for treatment or other patient management decisions. A negative result may occur with  improper specimen collection/handling, submission of specimen other than nasopharyngeal swab, presence of viral mutation(s) within the areas targeted by this assay, and inadequate number of viral copies(<138 copies/mL). A negative result must be combined with clinical observations, patient history, and epidemiological information. The expected result is Negative.  Fact Sheet for Patients:  EntrepreneurPulse.com.au  Fact Sheet for Healthcare Providers:  IncredibleEmployment.be  This test is no t yet approved or cleared by the [place] FDA and  has been authorized for detection and/or diagnosis of SARS-CoV-2 by FDA under an Emergency Use Authorization (EUA). This EUA will remain  in effect (meaning this test can be used) for the duration of the COVID-19 declaration under Section 564(b)(1) of the Act, 21 U.S.C.section 360bbb-3(b)(1), unless the authorization is terminated  or revoked sooner.       Influenza A by PCR NEGATIVE NEGATIVE Final   Influenza B by PCR NEGATIVE NEGATIVE Final    Comment: (NOTE) The Xpert Xpress SARS-CoV-2/FLU/RSV plus assay is intended as an aid in the diagnosis of influenza from Nasopharyngeal swab specimens and should not be used as a sole basis for treatment. Nasal washings and aspirates are unacceptable for Xpert Xpress SARS-CoV-2/FLU/RSV testing.  Fact Sheet for Patients: EntrepreneurPulse.com.au  Fact Sheet for Healthcare  Providers: IncredibleEmployment.be  This test is not yet approved or cleared by the [place] FDA and has been authorized for detection and/or diagnosis of SARS-CoV-2 by FDA under an Emergency Use Authorization (EUA). This EUA will remain in effect (meaning this test can be used) for the duration of the COVID-19 declaration under Section 564(b)(1) of the Act, 21 U.S.C. section 360bbb-3(b)(1), unless the authorization is terminated or revoked.  Performed at [facility], [place] [address] [place], [place] [address]   Culture, blood (Routine X 2) w Reflex to ID Panel     Status: None (Preliminary result)   Collection Time: 08/12/21 10:39 AM   Specimen: Left Antecubital; Blood  Result Value Ref Range Status   Specimen Description   Final    LEFT ANTECUBITAL Performed at [facility] [address] [place], [place] [address]    Special Requests   Final    BOTTLES DRAWN AEROBIC AND ANAEROBIC Blood Culture results may not be optimal due to an inadequate volume of blood received in culture bottles Performed at [facility] [address], [place], [place] [address]    Culture   Final    NO GROWTH 2 DAYS Performed at [facility] [address] [place], [place] [address]    Report Status PENDING  Incomplete  Culture, blood (Routine X 2) w Reflex to ID Panel     Status: None (Preliminary result)   Collection Time: 08/12/21 10:39 AM   Specimen: BLOOD LEFT HAND  Result Value Ref Range Status   Specimen Description   Final    BLOOD LEFT HAND Performed at [facility] [address], [place], [place] [address]    Special Requests   Final    BOTTLES DRAWN AEROBIC AND ANAEROBIC Blood Culture adequate volume Performed at [facility] [address] [place], [place] [address]    Culture   Final    NO GROWTH 2 DAYS Performed at [facility]  [address], [place], [place] [address]    Report Status PENDING  Incomplete         Radiology Studies: ECHOCARDIOGRAM COMPLETE  Result Date: 08/12/2021    ECHOCARDIOGRAM REPORT   Patient Name:   Charles Lloyd. Date of Exam: 08/12/2021 Medical Rec #:  [id number]           Height:       70.0 in Accession #:    [id number]          Weight:       189.8 lb Date of Birth:  08-13-1940           BSA:          2.041 m Patient Age:    74 years            BP:           113/61 mmHg Patient Gender: M                   HR:           81 bpm. Exam Location:  Inpatient Procedure: 2D Echo, Cardiac Doppler, Limited Color Doppler and Intracardiac            Opacification Agent Indications:    Dyspnea R06.00  History:        Patient has prior history of Echocardiogram examinations, most                 recent 10/07/2018. CHF, Arrythmias:Atrial Fibrillation,                 Signs/Symptoms:Shortness of Breath; Risk Factors:Diabetes and                 Hypertension. Edema. Chronic kidney disease. Past history of                 cancer.  Sonographer:    Darlina Sicilian RDCS Referring Phys: [place]  1. Left ventricular ejection fraction, by estimation, is 50%. The left ventricle has low normal function. The left ventricle has no regional wall motion abnormalities. Left ventricular diastolic parameters are indeterminate.  2. Right ventricular systolic function is hyperdynamic. The right ventricular size is normal. There is normal pulmonary artery systolic pressure.  3. The mitral valve is normal in structure. Mild mitral valve regurgitation. No evidence of mitral stenosis.  4. The aortic valve is tricuspid. Aortic valve regurgitation is not visualized. Aortic valve sclerosis is present, with no evidence of aortic valve stenosis. Comparison(s): No significant change from prior study. FINDINGS  Left Ventricle: Left ventricular ejection fraction, by estimation, is 50%. The left ventricle has low normal function. The  left ventricle has no regional wall motion abnormalities. Definity contrast agent was given IV to delineate the left ventricular endocardial borders. The left ventricular internal cavity size was normal in size. There is no left ventricular hypertrophy. Left ventricular diastolic parameters are indeterminate. Right Ventricle: The right ventricular size is normal. No increase in right ventricular wall thickness. Right ventricular systolic function is hyperdynamic. There is normal pulmonary artery systolic pressure. The tricuspid regurgitant velocity is 2.38 m/s, and with an assumed right atrial pressure of 3 mmHg, the estimated right ventricular systolic pressure is 30.1 mmHg. Left Atrium: Left atrial size was normal in size. Right Atrium: Right atrial size was normal in size. Pericardium: There is no evidence of pericardial effusion. Mitral Valve: The mitral valve is normal in structure. Mild mitral valve regurgitation. No evidence of mitral valve stenosis. Tricuspid Valve: The tricuspid valve is normal in structure.  Tricuspid valve regurgitation is mild . No evidence of tricuspid stenosis. Aortic Valve: The aortic valve is tricuspid. Aortic valve regurgitation is not visualized. Aortic valve sclerosis is present, with no evidence of aortic valve stenosis. Pulmonic Valve: The pulmonic valve was not well visualized. Pulmonic valve regurgitation is not visualized. No evidence of pulmonic stenosis. Aorta: The aortic root and ascending aorta are structurally normal, with no evidence of dilitation. IAS/Shunts: No atrial level shunt detected by color flow Doppler.  LEFT VENTRICLE PLAX 2D LVIDd:         5.90 cm      Diastology LVIDs:         3.20 cm      LV e' medial:    4.90 cm/s LV PW:         0.70 cm      LV E/e' medial:  17.1 LV IVS:        0.70 cm      LV e' lateral:   7.29 cm/s LVOT diam:     2.40 cm      LV E/e' lateral: 11.5 LV SV:         62 LV SV Index:   30 LVOT Area:     4.52 cm  LV Volumes (MOD) LV vol d, MOD  A2C: 149.0 ml LV vol d, MOD A4C: 183.0 ml LV vol s, MOD A2C: 72.3 ml LV vol s, MOD A4C: 95.2 ml LV SV MOD A2C:     76.7 ml LV SV MOD A4C:     183.0 ml LV SV MOD BP:      85.9 ml RIGHT VENTRICLE RV Basal diam:  3.95 cm RV Mid diam:    3.10 cm RV S prime:     17.80 cm/s TAPSE (M-mode): 2.7 cm LEFT ATRIUM             Index LA diam:        3.60 cm 1.76 cm/m LA Vol (A2C):   59.1 ml 28.95 ml/m LA Vol (A4C):   63.0 ml 30.86 ml/m LA Biplane Vol: 65.1 ml 31.89 ml/m  AORTIC VALVE LVOT Vmax:   78.30 cm/s LVOT Vmean:  54.500 cm/s LVOT VTI:    0.136 m  AORTA Ao Root diam: 3.60 cm Ao Asc diam:  3.50 cm MITRAL VALVE               TRICUSPID VALVE MV Area (PHT): 4.46 cm    TR Peak grad:   22.7 mmHg MV Decel Time: 170 msec    TR Vmax:        238.00 cm/s MV E velocity: 84.00 cm/s MV A velocity: 50.60 cm/s  SHUNTS MV E/A ratio:  1.66        Systemic VTI:  0.14 m                            Systemic Diam: 2.40 cm Rudean Haskell MD Electronically signed by Rudean Haskell MD Signature Date/Time: 08/12/2021/1:21:14 PM    Final         Scheduled Meds:  apixaban  5 mg Oral BID   atorvastatin  20 mg Oral Daily   carvedilol  12.5 mg Oral Daily   doxycycline  100 mg Oral BID   flecainide  100 mg Oral BID   furosemide  40 mg Intravenous BID   gabapentin  600 mg Oral QHS   insulin aspart  0-15 Units Subcutaneous TID WC  insulin aspart  0-5 Units Subcutaneous QHS   [START ON 08/15/2021] irbesartan  150 mg Oral Daily   montelukast  10 mg Oral QHS   potassium chloride SA  20 mEq Oral Daily   [START ON 08/15/2021] predniSONE  30 mg Oral Q breakfast   sodium bicarbonate  650 mg Oral TID   sodium chloride flush  3 mL Intravenous Q12H   Continuous Infusions:  sodium chloride       LOS: 3 days    Time spent: 38 min  Georgette Shell, MD 08/14/2021, 11:15 AM

## 2021-08-14 NOTE — Progress Notes (Signed)
Inpatient Diabetes Program Recommendations  AACE/ADA: New Consensus Statement on Inpatient Glycemic Control (2015)  Target Ranges:  Prepandial:   less than 140 mg/dL      Peak postprandial:   less than 180 mg/dL (1-2 hours)      Critically ill patients:  140 - 180 mg/dL   Lab Results  Component Value Date   GLUCAP 275 (H) 08/14/2021   HGBA1C 6.9 (H) 08/05/2021    Review of Glycemic Control  Latest Reference Range & Units 08/13/21 11:40 08/13/21 16:45 08/13/21 20:40 08/14/21 07:43  Glucose-Capillary 70 - 99 mg/dL 201 (H) 231 (H) 279 (H) 275 (H)  (H): Data is abnormally high Diabetes history: Type 2 DM Outpatient Diabetes medications: none Current orders for Inpatient glycemic control: Novolog 0-15 units TID & HS Prednisone 40 mg QA  Inpatient Diabetes Program Recommendations:    If steroids to be continued, consider adding Novolog 4 units TID (assuming patient is consuming >50% of meals).   Thanks, Bronson Curb, MSN, RNC-OB Diabetes Coordinator [phone] (8a-5p)

## 2021-08-14 NOTE — Telephone Encounter (Signed)
Error (IC)

## 2021-08-14 NOTE — Evaluation (Signed)
Physical Therapy Evaluation Patient Details Name: Charles Lloyd. MRN: [id number] DOB: [birth year] Today's Date: 08/14/2021  History of Present Illness  81 year old male recently admitted to the hospital 6/9 and 6/13  discharged 3 days ago for fall , possible sepsis.Readmitted 6/16  with fluid overload, bilateral lower extremity edema, shortness of breath and elevated BNP, right pleural effusion.PMH: type 2 diabetes, hypertension, chronic atrial fibrillation, diastolic heart failure, hyperlipidemia, gout, and skin and prostate  cancer, neuropathy. Is scheduled for back injection soon  Clinical Impression  The  patient expresses concern that he did not improve after DC from [facility]. Patient now requiring min assistnace for safe ambulation using RW.  Patient resides alone with limited family support.  BP 130/68, HR 79, SPO2 100% on RA after ambulation.  PT called to room after son arrived and reports that patient was   not improving and requiring assistance after Dc from [facility]. Pt admitted with above diagnosis.  Pt currently with functional limitations due to the deficits listed below (see PT Problem List). Pt will benefit from skilled PT to increase their independence and safety with mobility to allow discharge to the venue listed below.        Recommendations for follow up therapy are one component of a multi-disciplinary discharge planning process, led by the attending physician.  Recommendations may be updated based on patient status, additional functional criteria and insurance authorization.  Follow Up Recommendations Skilled nursing-short term rehab (<3 hours/day)    Assistance Recommended at Discharge Intermittent Supervision/Assistance  Patient can return home with the following  Assistance with cooking/housework;Help with stairs or ramp for entrance;A little help with walking and/or transfers;A little help with bathing/dressing/bathroom;Assist for transportation    Equipment Recommendations  Rolling walker (2 wheels)  Recommendations for Other Services       Functional Status Assessment Patient has had a recent decline in their functional status and demonstrates the ability to make significant improvements in function in a reasonable and predictable amount of time.     Precautions / Restrictions Precautions Precautions: Fall Precaution Comments: neuropathy      Mobility  Bed Mobility Overal bed mobility: Modified Independent                  Transfers Overall transfer level: Needs assistance Equipment used: Rolling walker (2 wheels) Transfers: Sit to/from Stand Sit to Stand: Supervision           General transfer comment: cues for safety to reach back, did not reach and demonstrated decreased control of descent50    Ambulation/Gait Ambulation/Gait assistance: Min assist Gait Distance (Feet): 50 Feet Assistive device: Rolling walker (2 wheels) Gait Pattern/deviations: Step-through pattern, Decreased stride length Gait velocity: decr.     General Gait Details: gait shuffling, looks down to floor,, I have to see my feet , they feel like bricks"  Stairs            Wheelchair Mobility    Modified Rankin (Stroke Patients Only)       Balance Overall balance assessment: Needs assistance Sitting-balance support: No upper extremity supported Sitting balance-Leahy Scale: Normal     Standing balance support: No upper extremity supported Standing balance-Leahy Scale: Fair Standing balance comment: poor for dynamic without UE support                             Pertinent Vitals/Pain Pain Assessment Faces Pain Scale: Hurts little more Pain Location: back and feet Pain  Descriptors / Indicators: Discomfort    Home Living Family/patient expects to be discharged to:: Private residence Living Arrangements: Alone Available Help at Discharge: Family;Available PRN/intermittently Type of Home: House Home Access: Stairs to  enter Entrance Stairs-Rails: None Entrance Stairs-Number of Steps: 1 Alternate Level Stairs-Number of Steps: 16 Home Layout: Two level;Bed/bath upstairs Home Equipment: None      Prior Function Prior Level of Function : Independent/Modified Independent;Driving             Mobility Comments: plays golf ADLs Comments: drives but cautiously due to neuropathy     Hand Dominance        Extremity/Trunk Assessment   Upper Extremity Assessment Upper Extremity Assessment: Overall WFL for tasks assessed    Lower Extremity Assessment Lower Extremity Assessment: RLE deficits/detail;LLE deficits/detail RLE Sensation: decreased light touch;history of peripheral neuropathy LLE Sensation: decreased light touch;history of peripheral neuropathy    Cervical / Trunk Assessment Cervical / Trunk Assessment: Other exceptions Cervical / Trunk Exceptions: linited neck extension  Communication      Cognition Arousal/Alertness: Awake/alert Behavior During Therapy: Flat affect Overall Cognitive Status: Within Functional Limits for tasks assessed                                 General Comments: expresses concern that he he did not do well after leaving [facility] last week.        General Comments      Exercises     Assessment/Plan    PT Assessment Patient needs continued PT services  PT Problem List Decreased strength;Decreased balance;Decreased mobility;Decreased knowledge of use of DME;Impaired sensation;Pain       PT Treatment Interventions DME instruction;Gait training;Stair training;Functional mobility training;Therapeutic activities;Therapeutic exercise;Patient/family education    PT Goals (Current goals can be found in the Care Plan section)  Acute Rehab PT Goals Patient Stated Goal: return to his baseline; playing golf PT Goal Formulation: With patient/family Time For Goal Achievement: 08/28/21 Potential to Achieve Goals: Good    Frequency Min 2X/week      Co-evaluation               AM-PAC PT "6 Clicks" Mobility  Outcome Measure Help needed turning from your back to your side while in a flat bed without using bedrails?: None Help needed moving from lying on your back to sitting on the side of a flat bed without using bedrails?: None Help needed moving to and from a bed to a chair (including a wheelchair)?: A Little Help needed standing up from a chair using your arms (e.g., wheelchair or bedside chair)?: A Little Help needed to walk in hospital room?: A Little Help needed climbing 3-5 steps with a railing? : Total 6 Click Score: 18    End of Session Equipment Utilized During Treatment: Gait belt Activity Tolerance: Patient tolerated treatment well Patient left: in chair;with call bell/phone within reach;with chair alarm set Nurse Communication: Mobility status PT Visit Diagnosis: Unsteadiness on feet (R26.81);Difficulty in walking, not elsewhere classified (R26.2)    Time: 4193-7902 PT Time Calculation (min) (ACUTE ONLY): 20 min   Charges:   PT Evaluation $PT Eval Low Complexity: [place] PT Acute Rehabilitation Services Pager [phone] Office [phone]   Claretha Cooper 08/14/2021, 10:24 AM

## 2021-08-14 NOTE — Care Management Important Message (Signed)
Important Message  Patient Details IM Letter given to the Patient. Name: Charles Lloyd. MRN: [id number] Date of Birth: Oct 17, 1940   Medicare Important Message Given:  Yes     Kerin Salen 08/14/2021, 12:58 PM

## 2021-08-15 DIAGNOSIS — I5033 Acute on chronic diastolic (congestive) heart failure: Secondary | ICD-10-CM | POA: Diagnosis not present

## 2021-08-15 LAB — BASIC METABOLIC PANEL
Anion gap: 8 (ref 5–15)
BUN: 44 mg/dL — ABNORMAL HIGH (ref 8–23)
CO2: 24 mmol/L (ref 22–32)
Calcium: 8.4 mg/dL — ABNORMAL LOW (ref 8.9–10.3)
Chloride: 107 mmol/L (ref 98–111)
Creatinine, Ser: 1.3 mg/dL — ABNORMAL HIGH (ref 0.61–1.24)
GFR, Estimated: 56 mL/min — ABNORMAL LOW (ref >60)
Glucose, Bld: 212 mg/dL — ABNORMAL HIGH (ref 70–99)
Potassium: 4 mmol/L (ref 3.5–5.1)
Sodium: 139 mmol/L (ref 135–145)

## 2021-08-15 LAB — GLUCOSE, CAPILLARY
Glucose-Capillary: 230 mg/dL — ABNORMAL HIGH (ref 70–99)
Glucose-Capillary: 391 mg/dL — ABNORMAL HIGH (ref 70–99)
Glucose-Capillary: 68 mg/dL — ABNORMAL LOW (ref 70–99)
Glucose-Capillary: 150 mg/dL — ABNORMAL HIGH (ref 70–99)
Glucose-Capillary: 305 mg/dL — ABNORMAL HIGH (ref 70–99)

## 2021-08-15 LAB — ANA: Anti Nuclear Antibody (ANA): NEGATIVE

## 2021-08-15 MED ORDER — PREDNISONE 20 MG PO TABS
20.0000 mg | ORAL_TABLET | Freq: Every day | ORAL | Status: DC
  Administered 2021-08-16: 20 mg via ORAL
  Filled 2021-08-15: qty 1

## 2021-08-15 MED ORDER — ORAL CARE MOUTH RINSE: 15.0000 mL | OROMUCOSAL | Status: DC | PRN

## 2021-08-15 MED ORDER — FUROSEMIDE 10 MG/ML IJ SOLN: 20.0000 mg | Freq: Every day | INTRAMUSCULAR | Status: DC

## 2021-08-15 MED ORDER — IRBESARTAN 150 MG PO TABS
75.0000 mg | ORAL_TABLET | Freq: Every day | ORAL | Status: DC
  Administered 2021-08-16: 75 mg via ORAL
  Filled 2021-08-15: qty 1

## 2021-08-15 MED ORDER — CARVEDILOL 3.125 MG PO TABS
3.1250 mg | ORAL_TABLET | Freq: Every day | ORAL | Status: DC
  Administered 2021-08-16: 3.125 mg via ORAL
  Filled 2021-08-15: qty 1

## 2021-08-15 NOTE — Progress Notes (Signed)
PROGRESS NOTE    Charles Lloyd.  ZOX:[id number] DOB: 07/01/1940 DOA: 08/11/2021 PCP: Isaac Bliss, [name], MD   Brief Narrative: 81 year old male recently admitted to the hospital and discharged 3 days ago for possible sepsis received IV fluids now readmitted with fluid overload bilateral lower extremity edema shortness of breath and elevated BNP.  His history significant for type 2 diabetes hypertension chronic atrial fibrillation diastolic heart failure, on Eliquis chronically, hyperlipidemia gout and skin cancer.  He reports he is very active prior to being admitted to the hospital last time.  He plays golf he moves around without difficulty.  He lives alone.  COVID-19 influenza were negative chest x-ray shows tiny right pleural effusion. 08/12/2021 while patient was on the floor he developed a temp of 103.3.  He denies nausea vomiting diarrhea cough.  He feels that his leg swelling has gone down from diuresis. Looking through his prior records he was admitted earlier this month with fever of 102.4 without a proven source of infection.  MRI of the lumbar spine showed severe L4-L5 spinal stenosis. Infectious work-up included blood culture urine culture respiratory virus panel COVID all were negative last admission.  Patient had leukopenia and thrombocytopenia discharged on doxycycline for a total of 10 days.  ID was consulted started patient on doxycycline and start broad-spectrum antibiotics.  Assessment & Plan:   Principal Problem:   Acute exacerbation of CHF (congestive heart failure) (HCC) Active Problems:   Paroxysmal A-fib (HCC)   Hyperlipidemia associated with type 2 diabetes mellitus (HCC)   Neoplasm of prostate, malignant ([place])   Essential hypertension   Chronic kidney disease, stage 3b ([place])   #1 fever of unclear etiology likely due to possible tickborne disease ?ehrlichiosis/anaplasmosis-patient was admitted to the hospital few days ago with fever and all the infectious  work-up were negative.  However his fever continued with lymphopenia and leukopenia and new thrombocytopenia which was suspicious for tickborne diseases and he was started on doxycycline with stable and improved white count and platelets.  Patient was home for 3 days.  Then he started to have lower extremity edema and shortness of breath and apparently had diarrhea.  He has had no diarrhea during this hospital stay. He was due to finish doxycycline for a total of 10 days  He has not had a fever since restarting the doxycycline.  He has not had any bowel movement since admission. Continue doxycycline for a total of 10 days.  Last dose of doxycycline is 08/16/2021  #2 acute on chronic diastolic heart failure patient presented with dyspnea on exertion shortness of breath lower extremity edema 3+ improved on diuresis.  Echo this admission ejection fraction is 50% with normal low normal left ventricular function.  No regional wall motion abnormalities.  He was being treated with Lasix 40 mg IV twice daily he looks a little on the dry side so I have stopped his Lasix.  Reassess tomorrow.  3.  History of essential hypertension -his blood pressure has been soft 105/45 Hold Lasix Decrease the dose of Coreg Decrease the dose of ACE   4.  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  Discharge him on prednisone taper and have him follow-up with rheumatology.  #11 severe deconditioning and generalized weakness  from #1 PT consult pending he will need to go to rehab on discharge discussed with son  Estimated body mass index is 26.63 kg/m as calculated from the following:   Height as of this encounter: '5\' 10"'$  (1.778 m).   Weight as of this encounter: 84.2 kg.  DVT prophylaxis: Eliquis Code Status: Full code Family Communication: Discussed with son Disposition Plan:  Status is: Inpatient Remains inpatient appropriate because: CHF fever unknown etiology   Consultants:  None  Procedures: None Antimicrobials: Doxycycline  Subjective: Feels better anxious to go to rehab and get stronger pain is better no new complaints no diarrhea  Objective: Vitals:   08/14/21 2031 08/15/21 0452 08/15/21 0500 08/15/21 1254  BP: (!) 143/76 (!) 141/62  (!) 105/45  Pulse: 65 65  62  Resp:  16  18  Temp: 97.6 F (36.4 C) 97.9 F (36.6 C)  97.8 F (36.6 C)  TempSrc: Oral Oral  Oral  SpO2: 97% 94%  97%  Weight:   84.2 kg   Height:        Intake/Output Summary (Last 24 hours) at 08/15/2021 1603 Last data filed at 08/15/2021 1300 Gross per 24 hour  Intake 483 ml  Output 1850 ml  Net -1367 ml    Filed Weights   08/13/21 0500 08/14/21 0445 08/15/21 0500  Weight: 85 kg 83.7 kg 84.2 kg    Examination: Swollen index finger of both hands  General exam: Appears in no acute distress Respiratory system: Diminished at the bases to auscultation. Respiratory effort normal. Cardiovascular system: S1 & S2 heard, RRR. No JVD, murmurs, rubs, gallops or clicks. No pedal edema. Gastrointestinal system: Abdomen is nondistended, soft and nontender. No organomegaly or masses felt. Normal bowel sounds heard. Central nervous system: Alert and oriented. No focal neurological deficits. Extremities: 1+ pitting edema decreased swelling to the index finger of both upper extremity Skin: No rashes, lesions or ulcers Psychiatry: Judgement and insight appear normal. Mood & affect appropriate.     Data Reviewed: I have  personally reviewed following labs and imaging studies  CBC: Recent Labs  Lab 08/10/21 1507 08/11/21 1449 08/13/21 0353 08/14/21 0331  WBC 5.6 6.0 4.9 4.9  NEUTROABS 3.9  --   --   --   HGB 9.7* 9.4* 8.2* 7.9*  HCT 29.3* 29.3* 25.3* 24.9*  MCV 90.7 91.3 92.7 90.9  PLT 168.0 202 193 024    Basic Metabolic Panel: Recent Labs  Lab 08/11/21 1449 08/12/21 0350 08/13/21 0353 08/14/21 0331 08/15/21 0416  NA 135 134* 137 137 139  K 4.6 3.8 3.8 4.1 4.0  CL 107 105 106 105 107  CO2 20* 22 21* 24 24  GLUCOSE 178* 161* 148* 275* 212*  BUN 31* 30* 31* 40* 44*  CREATININE 1.61* 1.52* 1.69* 1.63* 1.30*  CALCIUM 8.7* 8.2* 7.9* 8.2* 8.4*    GFR: Estimated Creatinine Clearance: 48.4 mL/min (A) (by C-G formula based on SCr of 1.3 mg/dL (H)). Liver Function Tests: Recent Labs  Lab 08/10/21 1507 08/13/21 0353 08/14/21 0331  AST 33 28 25  ALT '29 27 28  '$ ALKPHOS 73 66 66  BILITOT 0.9 1.3* 1.1  PROT 5.7* 5.2* 5.3*  ALBUMIN 3.1* 2.5* 2.5*  No results for input(s): "LIPASE", "AMYLASE" in the last 168 hours. No results for input(s): "AMMONIA" in the last 168 hours. Coagulation Profile: No results for input(s): "INR", "PROTIME" in the last 168 hours. Cardiac Enzymes: Recent Labs  Lab 08/13/21 0427  CKTOTAL 41*    BNP (last 3 results) No results for input(s): "PROBNP" in the last 8760 hours. HbA1C: No results for input(s): "HGBA1C" in the last 72 hours. CBG: Recent Labs  Lab 08/14/21 1243 08/14/21 1712 08/14/21 2050 08/15/21 0748 08/15/21 1155  GLUCAP 298* 259* 226* 230* 391*    Lipid Profile: No results for input(s): "CHOL", "HDL", "LDLCALC", "TRIG", "CHOLHDL", "LDLDIRECT" in the last 72 hours. Thyroid Function Tests: No results for input(s): "TSH", "T4TOTAL", "FREET4", "T3FREE", "THYROIDAB" in the last 72 hours. Anemia Panel: No results for input(s): "VITAMINB12", "FOLATE", "FERRITIN", "TIBC", "IRON", "RETICCTPCT" in the last 72 hours. Sepsis Labs: No results  for input(s): "PROCALCITON", "LATICACIDVEN" in the last 168 hours.  Recent Results (from the past 240 hour(s))  Respiratory (~20 pathogens) panel by PCR     Status: None   Collection Time: 08/06/21  9:43 AM   Specimen: Nasopharyngeal Swab; Respiratory  Result Value Ref Range Status   Adenovirus NOT DETECTED NOT DETECTED Final   Coronavirus 229E NOT DETECTED NOT DETECTED Final    Comment: (NOTE) The Coronavirus on the Respiratory Panel, DOES NOT test for the novel  Coronavirus (2019 nCoV)    Coronavirus HKU1 NOT DETECTED NOT DETECTED Final   Coronavirus NL63 NOT DETECTED NOT DETECTED Final   Coronavirus OC43 NOT DETECTED NOT DETECTED Final   Metapneumovirus NOT DETECTED NOT DETECTED Final   Rhinovirus / Enterovirus NOT DETECTED NOT DETECTED Final   Influenza A NOT DETECTED NOT DETECTED Final   Influenza B NOT DETECTED NOT DETECTED Final   Parainfluenza Virus 1 NOT DETECTED NOT DETECTED Final   Parainfluenza Virus 2 NOT DETECTED NOT DETECTED Final   Parainfluenza Virus 3 NOT DETECTED NOT DETECTED Final   Parainfluenza Virus 4 NOT DETECTED NOT DETECTED Final   Respiratory Syncytial Virus NOT DETECTED NOT DETECTED Final   Bordetella pertussis NOT DETECTED NOT DETECTED Final   Bordetella Parapertussis NOT DETECTED NOT DETECTED Final   Chlamydophila pneumoniae NOT DETECTED NOT DETECTED Final   Mycoplasma pneumoniae NOT DETECTED NOT DETECTED Final    Comment: Performed at [facility], [place]. [address] [place], [place] [address]  C Difficile Quick Screen w PCR reflex     Status: None   Collection Time: 08/11/21  5:35 PM   Specimen: STOOL  Result Value Ref Range Status   C Diff antigen NEGATIVE NEGATIVE Final   C Diff toxin NEGATIVE NEGATIVE Final   C Diff interpretation No C. difficile detected.  Final    Comment: Performed at [facility], [place] [address] [place], [place] [address]  Resp Panel by RT-PCR (Flu A&B, Covid) Anterior Nasal Swab     Status: None    Collection Time: 08/11/21  6:08 PM   Specimen: Anterior Nasal Swab  Result Value Ref Range Status   SARS Coronavirus 2 by RT PCR NEGATIVE NEGATIVE Final    Comment: (NOTE) SARS-CoV-2 target nucleic acids are NOT DETECTED.  The SARS-CoV-2 RNA is generally detectable in upper respiratory specimens during the acute phase of infection. The lowest concentration of SARS-CoV-2 viral copies this assay can detect is 138 copies/mL. A negative result does not preclude SARS-Cov-2 infection and should not be used as the sole basis for treatment or other patient management  decisions. A negative result may occur with  improper specimen collection/handling, submission of specimen other than nasopharyngeal swab, presence of viral mutation(s) within the areas targeted by this assay, and inadequate number of viral copies(<138 copies/mL). A negative result must be combined with clinical observations, patient history, and epidemiological information. The expected result is Negative.  Fact Sheet for Patients:  EntrepreneurPulse.com.au  Fact Sheet for Healthcare Providers:  IncredibleEmployment.be  This test is no t yet approved or cleared by the [place] FDA and  has been authorized for detection and/or diagnosis of SARS-CoV-2 by FDA under an Emergency Use Authorization (EUA). This EUA will remain  in effect (meaning this test can be used) for the duration of the COVID-19 declaration under Section 564(b)(1) of the Act, 21 U.S.C.section 360bbb-3(b)(1), unless the authorization is terminated  or revoked sooner.       Influenza A by PCR NEGATIVE NEGATIVE Final   Influenza B by PCR NEGATIVE NEGATIVE Final    Comment: (NOTE) The Xpert Xpress SARS-CoV-2/FLU/RSV plus assay is intended as an aid in the diagnosis of influenza from Nasopharyngeal swab specimens and should not be used as a sole basis for treatment. Nasal washings and aspirates are unacceptable for  Xpert Xpress SARS-CoV-2/FLU/RSV testing.  Fact Sheet for Patients: EntrepreneurPulse.com.au  Fact Sheet for Healthcare Providers: IncredibleEmployment.be  This test is not yet approved or cleared by the [place] FDA and has been authorized for detection and/or diagnosis of SARS-CoV-2 by FDA under an Emergency Use Authorization (EUA). This EUA will remain in effect (meaning this test can be used) for the duration of the COVID-19 declaration under Section 564(b)(1) of the Act, 21 U.S.C. section 360bbb-3(b)(1), unless the authorization is terminated or revoked.  Performed at [facility], [place] [address] [place], [place] [address]   Culture, blood (Routine X 2) w Reflex to ID Panel     Status: None (Preliminary result)   Collection Time: 08/12/21 10:39 AM   Specimen: Left Antecubital; Blood  Result Value Ref Range Status   Specimen Description   Final    LEFT ANTECUBITAL Performed at [place] [address] [place], [place] [address]    Special Requests   Final    BOTTLES DRAWN AEROBIC AND ANAEROBIC Blood Culture results may not be optimal due to an inadequate volume of blood received in culture bottles Performed at [place] [address] [place], [place] [address]    Culture   Final    NO GROWTH 3 DAYS Performed at [facility], [place] [address], [place], [place] [address]    Report Status PENDING  Incomplete  Culture, blood (Routine X 2) w Reflex to ID Panel     Status: None (Preliminary result)   Collection Time: 08/12/21 10:39 AM   Specimen: BLOOD LEFT HAND  Result Value Ref Range Status   Specimen Description   Final    BLOOD LEFT HAND Performed at [place] [address] [place], [place] [address]    Special Requests   Final    BOTTLES DRAWN AEROBIC AND ANAEROBIC Blood Culture adequate volume Performed at [place] [address] [place], [place] [address]    Culture   Final    NO GROWTH 3 DAYS Performed at [facility], [place] [address], [place],  [address]    Report Status PENDING  Incomplete  Gastrointestinal Panel by PCR , Stool     Status: None  Collection Time: 08/13/21 10:05 AM   Specimen: Stool  Result Value Ref Range Status   Campylobacter species NOT DETECTED NOT DETECTED Final   Plesimonas shigelloides NOT DETECTED NOT DETECTED Final   Salmonella species NOT DETECTED NOT DETECTED Final   Yersinia enterocolitica NOT DETECTED NOT DETECTED Final   Vibrio species NOT DETECTED NOT DETECTED Final   Vibrio cholerae NOT DETECTED NOT DETECTED Final   Enteroaggregative E coli (EAEC) NOT DETECTED NOT DETECTED Final   Enteropathogenic E coli (EPEC) NOT DETECTED NOT DETECTED Final   Enterotoxigenic E coli (ETEC) NOT DETECTED NOT DETECTED Final   Shiga like toxin producing E coli (STEC) NOT DETECTED NOT DETECTED Final   Shigella/Enteroinvasive E coli (EIEC) NOT DETECTED NOT DETECTED Final   Cryptosporidium NOT DETECTED NOT DETECTED Final   Cyclospora cayetanensis NOT DETECTED NOT DETECTED Final   Entamoeba histolytica NOT DETECTED NOT DETECTED Final   Giardia lamblia NOT DETECTED NOT DETECTED Final   Adenovirus F40/41 NOT DETECTED NOT DETECTED Final   Astrovirus NOT DETECTED NOT DETECTED Final   Norovirus GI/GII NOT DETECTED NOT DETECTED Final   Rotavirus A NOT DETECTED NOT DETECTED Final   Sapovirus (I, II, IV, and V) NOT DETECTED NOT DETECTED Final    Comment: Performed at [facility], [address] [place], [place] [address]         Radiology Studies: No results found.      Scheduled Meds:  apixaban  5 mg Oral BID   atorvastatin  20 mg Oral Daily   carvedilol  12.5 mg Oral Daily   doxycycline  100 mg Oral BID   flecainide  100 mg Oral BID   furosemide  40 mg Intravenous Daily   gabapentin  600 mg Oral QHS   insulin aspart  0-15 Units  Subcutaneous TID WC   insulin aspart  0-5 Units Subcutaneous QHS   insulin aspart  4 Units Subcutaneous TID WC   irbesartan  150 mg Oral Daily   montelukast  10 mg Oral QHS   potassium chloride SA  20 mEq Oral Daily   predniSONE  30 mg Oral Q breakfast   sodium bicarbonate  650 mg Oral TID   sodium chloride flush  3 mL Intravenous Q12H   Continuous Infusions:  sodium chloride       LOS: 4 days    Time spent: 38 min  Georgette Shell, MD 08/15/2021, 4:03 PM

## 2021-08-15 NOTE — Inpatient Diabetes Management (Addendum)
Inpatient Diabetes Program Recommendations  AACE/ADA: New Consensus Statement on Inpatient Glycemic Control (2015)  Target Ranges:  Prepandial:   less than 140 mg/dL      Peak postprandial:   less than 180 mg/dL (1-2 hours)      Critically ill patients:  140 - 180 mg/dL    Latest Reference Range & Units 08/14/21 07:43 08/14/21 12:43 08/14/21 17:12 08/14/21 20:50  Glucose-Capillary 70 - 99 mg/dL 275 (H)  8 units Novolog  298 (H)  8 units Novolog  259 (H)  12 units Novolog  226 (H)  2 units Novolog   (H): Data is abnormally high  Latest Reference Range & Units 08/15/21 07:48 08/15/21 11:55  Glucose-Capillary 70 - 99 mg/dL 230 (H)  12 units Novolog  391 (H)  19 units Novolog   (H): Data is abnormally high    Home DM Meds: None Listed  Current Orders: Novolog Moderate Correction Scale/ SSI (0-15 units) TID AC + HS      Novolog 4 units TID with meals     MD- Note Prednisone reduced to 30 mg daily today.  Still having significant CBG elevations.  Please consider:  1. Start weight based Basal insulin: Semglee 8 units QHS (0.1 units/kg)  2. Increase Novolog Meal Coverage to 6 units TID with meals HOLD of pt eats <50% meals    --Will follow patient during hospitalization--  Wyn Quaker RN, MSN, CDE Diabetes Coordinator Inpatient Glycemic Control Team Team Pager: [phone] (8a-5p)

## 2021-08-15 NOTE — TOC Progression Note (Signed)
Transition of Care (TOC) - Progression Note    Patient Details  Name: Charles Lloyd. MRN: [id number] Date of Birth: 1940/06/20  Transition [facility]) CM/SW Contact  Purcell Mouton, RN Phone Number: 08/15/2021, 11:50 AM  Clinical Narrative:    Spoke with pt's son concerning SNF bed offers. Iona Beard, selected [facility]. Insurance Josem Kaufmann was started.    Expected Discharge Plan: Home/Self Care Barriers to Discharge: No Barriers Identified  Expected Discharge Plan and Services Expected Discharge Plan: Home/Self Care     Post Acute Care Choice: Home Health, [place] Living arrangements for the past 2 months: Single Family Home                                       Social Determinants of Health (SDOH) Interventions    Readmission Risk Interventions    08/08/2021   10:00 AM  Readmission Risk Prevention Plan  Transportation Screening Complete  Medication Review ([place]) Complete  PCP or Specialist appointment within 3-5 days of discharge Complete  HRI or [place] Complete  SW Recovery Care/Counseling Consult Not Complete  [place] Not Applicable

## 2021-08-16 ENCOUNTER — Encounter (HOSPITAL_COMMUNITY): Payer: Self-pay | Admitting: Internal Medicine

## 2021-08-16 DIAGNOSIS — N1832 Chronic kidney disease, stage 3b: Secondary | ICD-10-CM | POA: Diagnosis not present

## 2021-08-16 DIAGNOSIS — I10 Essential (primary) hypertension: Secondary | ICD-10-CM | POA: Diagnosis not present

## 2021-08-16 DIAGNOSIS — I5033 Acute on chronic diastolic (congestive) heart failure: Secondary | ICD-10-CM | POA: Diagnosis not present

## 2021-08-16 DIAGNOSIS — I480 Paroxysmal atrial fibrillation: Secondary | ICD-10-CM

## 2021-08-16 DIAGNOSIS — E1169 Type 2 diabetes mellitus with other specified complication: Secondary | ICD-10-CM | POA: Diagnosis not present

## 2021-08-16 DIAGNOSIS — R509 Fever, unspecified: Secondary | ICD-10-CM

## 2021-08-16 LAB — GLUCOSE, CAPILLARY
Glucose-Capillary: 247 mg/dL — ABNORMAL HIGH (ref 70–99)
Glucose-Capillary: 258 mg/dL — ABNORMAL HIGH (ref 70–99)

## 2021-08-16 LAB — BASIC METABOLIC PANEL
Anion gap: 8 (ref 5–15)
BUN: 40 mg/dL — ABNORMAL HIGH (ref 8–23)
CO2: 25 mmol/L (ref 22–32)
Calcium: 8.3 mg/dL — ABNORMAL LOW (ref 8.9–10.3)
Chloride: 105 mmol/L (ref 98–111)
Creatinine, Ser: 1.2 mg/dL (ref 0.61–1.24)
GFR, Estimated: >60 mL/min (ref >60)
Glucose, Bld: 228 mg/dL — ABNORMAL HIGH (ref 70–99)
Potassium: 4 mmol/L (ref 3.5–5.1)
Sodium: 138 mmol/L (ref 135–145)

## 2021-08-16 MED ORDER — INSULIN GLARGINE-YFGN 100 UNIT/ML ~~LOC~~ SOLN: 8.0000 [IU] | Freq: Every day | SUBCUTANEOUS | 11 refills | Status: DC

## 2021-08-16 MED ORDER — INSULIN ASPART 100 UNIT/ML IJ SOLN: 0.0000 [IU] | Freq: Three times a day (TID) | INTRAMUSCULAR | 0 refills | Status: DC

## 2021-08-16 MED ORDER — CARVEDILOL 3.125 MG PO TABS: 3.1250 mg | ORAL_TABLET | Freq: Two times a day (BID) | ORAL | Status: DC

## 2021-08-16 MED ORDER — VALSARTAN 160 MG PO TABS: 75.0000 mg | ORAL_TABLET | Freq: Every day | ORAL | 0 refills | Status: DC

## 2021-08-16 MED ORDER — PREDNISONE 20 MG PO TABS: 20.0000 mg | ORAL_TABLET | Freq: Every day | ORAL | 0 refills | Status: AC

## 2021-08-16 MED ORDER — SODIUM BICARBONATE 650 MG PO TABS: 650.0000 mg | ORAL_TABLET | Freq: Three times a day (TID) | ORAL | 0 refills | Status: AC

## 2021-08-16 MED ORDER — DOXYCYCLINE HYCLATE 100 MG PO TABS: 100.0000 mg | ORAL_TABLET | Freq: Two times a day (BID) | ORAL | 0 refills | Status: AC

## 2021-08-16 NOTE — Progress Notes (Signed)
Report called to [facility] facility. Joelene Millin RN received the report. Pt. Notified of the discharge update.

## 2021-08-16 NOTE — Progress Notes (Signed)
Pt. Discharged via EMS. Pt. Alert and oriented, not in distress. All patient belongings are with patient. Report called to receiving facility.

## 2021-08-16 NOTE — Progress Notes (Signed)
PT Cancellation Note  Patient Details Name: Charles Lloyd. MRN: [id number] DOB: 1941-01-09   Cancelled Treatment:    Reason Eval/Treat Not Completed: Patient declined, no reason specified.  Pt reports doctor just came into room to discharge him and he is going home in 10 minutes, politely declines PT interventions.    Talbot Grumbling PT, DPT 08/16/21, 10:39 AM

## 2021-08-16 NOTE — Discharge Summary (Signed)
Physician Discharge Summary   Patient: Charles Lloyd. MRN: [id number] DOB: [birth year]  Admit date:     08/11/2021  Discharge date: 08/16/21  Discharge Physician: Alma Friendly   PCP: Isaac Bliss, [name], MD   Recommendations at discharge:  Follow-up with PCP in 1 week Follow-up with cardiology/EP as scheduled   Discharge Diagnoses: Principal Problem:   Acute exacerbation of CHF (congestive heart failure) (HCC) Active Problems:   Paroxysmal A-fib (HCC)   Hyperlipidemia associated with type 2 diabetes mellitus (HCC)   Neoplasm of prostate, malignant ([place])   Essential hypertension   Chronic kidney disease, stage 3b [facility])    Hospital Course: 81 year old male recently admitted to the hospital and discharged 3 days ago for possible sepsis received IV fluids now readmitted with fluid overload bilateral lower extremity edema shortness of breath and elevated BNP.  His history significant for type 2 diabetes hypertension chronic atrial fibrillation diastolic heart failure, on Eliquis chronically, hyperlipidemia gout and skin cancer.  He reports he is very active prior to being admitted to the hospital last time.  He plays golf he moves around without difficulty.  He lives alone. 08/12/2021 while patient was on the floor he developed a temp of 103.3, which was noted to occur on prior admission without a proven source of infection.  MRI of the lumbar spine showed severe L4-L5 spinal stenosis. Infectious work-up included blood culture urine culture respiratory virus panel COVID all were negative last admission.  Patient had leukopenia and thrombocytopenia discharged on doxycycline for a total of 10 days.  ID was consulted restarted patient on doxycycline.    Today, patient denied any new complaints, eager to be discharged.  Denies any fever, chest pain, abdominal pain, nausea/vomiting, chills.  Patient to be discharged to SNF, with follow-up with PCP in 1 week,  cardiology/EP.    Assessment and Plan:  ?Fever of unclear etiology likely due to possible tickborne disease ?ehrlichiosis/anaplasmosis Currently afebrile with no leukocytosis Suspicious for tickborne diseases and was started on doxycycline as rec by ID Last dose of doxycycline is on 08/16/2021, for a total of 10 days Follow up with PCP   Acute on chronic diastolic heart failure  Improved, currently on RA  Echo this admission ejection fraction is 50% with normal low normal left ventricular function, no regional wall motion abnormalities S/P Lasix 40 mg IV twice daily, appears euvolemic, continue home torsemide  History of essential hypertension  Decreased the dose of Coreg to 3.125 mg BID from 12.5 mg BID, adjust accordingly  Decreased the dose of ACE to 80 mg daily from 160 mg daily  Follow up with PCP  CKD stage IIIb Stable    Type 2 diabetes mellitus, uncontrolled 2/2 steroid use Recent hemoglobin A1c is 6.9 Not on any home meds Due to steroid use, CBGs uncontrolled, start SSI's, Accu-Cheks, Semglee 8 units daily, monitor closely and discontinue if CBGs normalized after steroid use  ?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 and patient was instructed, not to drive, operate heavy machinery, perform activities at heights, swimming or participation in water  activities or provide baby-sitting services while on Pain, Sleep and Anxiety Medications; until their outpatient Physician has advised to do so again. Also recommended to not to  take more than prescribed Pain, Sleep and Anxiety Medications.     Consultants: None Procedures performed: None Disposition: Skilled nursing facility Diet recommendation:  Cardiac and Carb modified diet DISCHARGE MEDICATION: Allergies as of 08/16/2021   No Known Allergies      Medication List     STOP taking these medications    colchicine 0.6 MG tablet   doxycycline 100 MG capsule Commonly known as: VIBRAMYCIN Replaced by: doxycycline 100 MG tablet       TAKE these medications    Accu-Chek Aviva Plus test strip Generic drug: glucose blood 1 each by Other route daily. Dx E11.9   albuterol 108 (90 Base) MCG/ACT inhaler Commonly known as: VENTOLIN HFA Inhale 1-2 puffs into the lungs every 6 (six) hours as needed for wheezing or shortness of breath.   allopurinol 300 MG tablet Commonly known as: ZYLOPRIM Take 1 tablet (300 mg total) by mouth daily. What changed:  when to take this reasons to take this   atorvastatin 20 MG tablet Commonly known as: LIPITOR TAKE 1 TABLET DAILY   carvedilol 3.125 MG tablet Commonly known as: COREG Take 1 tablet (3.125 mg total) by mouth 2 (two) times daily with a meal. What changed:  medication strength how much to take when to take this   doxycycline 100 MG tablet Commonly known as: VIBRA-TABS Take 1 tablet (100 mg total) by mouth 2 (two) times daily for 1 day. Replaces: doxycycline 100 MG capsule   Eliquis 5 MG Tabs tablet Generic drug: apixaban TAKE 1 TABLET TWICE A DAY What changed: how much to take   flecainide 100 MG tablet Commonly known as: TAMBOCOR Take 1 tablet (100 mg total) by mouth 2 (two) times daily.   fluticasone 50 MCG/ACT nasal spray Commonly known as: FLONASE Place 2 sprays into both nostrils daily. What changed:  when to take this reasons to take this   fluticasone furoate-vilanterol 100-25 MCG/INH Aepb Commonly known as: Breo Ellipta Inhale 1 puff into the lungs daily. What changed:   when to take this reasons to take this   gabapentin 300 MG capsule Commonly known as: NEURONTIN TAKE 2 CAPSULES BY MOUTH AT BEDTIME   insulin aspart 100 UNIT/ML injection Commonly known as: novoLOG Inject 0-15 Units into the skin 3 (three) times daily with meals.   insulin glargine-yfgn 100 UNIT/ML injection Commonly known as: SEMGLEE Inject 0.08 mLs (8 Units total) into the skin daily.   Klor-Con M20 20 MEQ tablet Generic drug: potassium chloride SA Take 1 tablet (20 mEq total) by mouth daily.   montelukast 10 MG tablet Commonly known as: SINGULAIR Take 10 mg by mouth at bedtime.   predniSONE 20 MG tablet Commonly known as: DELTASONE Take 1 tablet (20 mg total) by mouth daily with breakfast for 4 days. Start taking on: August 17, 2021   Restasis 0.05 % ophthalmic emulsion Generic drug: cycloSPORINE Place 1 drop into both eyes 2 (two) times daily as needed (dry eyes).   sodium bicarbonate 650 MG tablet Take 1 tablet (650 mg total) by mouth 3 (three) times daily for 15 doses.   torsemide 20 MG tablet Commonly known as: Demadex Take 1 tablet (20 mg total) by mouth daily.   triamcinolone cream 0.1 % Commonly known as: KENALOG Apply 1 application. topically 2 (two) times daily. What changed:  when to  take this reasons to take this   valsartan 160 MG tablet Commonly known as: DIOVAN Take 0.5 tablets (80 mg total) by mouth daily. Patient must keep April 2023 appointment for further refills. 3 rd/ final attempt What changed: how much to take        Follow-up Information     Isaac Bliss, [name], MD. Schedule an appointment as soon as possible for a visit in 1 week(s).   Specialty: Internal Medicine Contact information: [place] [address] [phone]         [name], MD .   Specialty: Cardiology Contact information: [place] [address] [phone]         Richardo Priest, MD .   Specialties: Cardiology, Radiology Contact information: [address] [address] [phone]                Discharge Exam: Danley Danker Weights   08/13/21 0500 08/14/21 0445 08/15/21 0500  Weight: 85 kg 83.7 kg 84.2 kg   General: NAD  Cardiovascular: S1, S2 present Respiratory: CTAB Abdomen: Soft, nontender, nondistended, bowel sounds present Musculoskeletal: No bilateral pedal edema noted Skin: Normal Psychiatry: Normal mood   Condition at discharge: stable  The results of significant diagnostics from this hospitalization (including imaging, microbiology, ancillary and laboratory) are listed below for reference.   Imaging Studies: ECHOCARDIOGRAM COMPLETE  Result Date: 08/12/2021    ECHOCARDIOGRAM REPORT   Patient Name:   Charles Lloyd. Date of Exam: 08/12/2021 Medical Rec #:  [id number]           Height:       70.0 in Accession #:    [id number]          Weight:       189.8 lb Date of Birth:  1941-01-08           BSA:          2.041 m Patient Age:    5 years            BP:           113/61 mmHg Patient Gender: M                   HR:           81 bpm. Exam Location:  Inpatient Procedure: 2D Echo, Cardiac Doppler, Limited Color Doppler and Intracardiac            Opacification Agent Indications:    Dyspnea R06.00  History:        Patient has prior history of Echocardiogram examinations, most                 recent 10/07/2018. CHF, Arrythmias:Atrial Fibrillation,                 Signs/Symptoms:Shortness of Breath; Risk Factors:Diabetes and                 Hypertension. Edema. Chronic kidney disease. Past history of                 cancer.  Sonographer:    Darlina Sicilian RDCS Referring Phys: [place]  1. Left ventricular ejection fraction, by estimation, is 50%. The left ventricle has low normal function. The left ventricle has no regional wall motion abnormalities. Left ventricular diastolic parameters are indeterminate.  2. Right  ventricular systolic function is hyperdynamic. The right ventricular  size is normal. There is normal pulmonary artery systolic pressure.  3. The mitral valve is normal in structure. Mild mitral valve regurgitation. No evidence of mitral stenosis.  4. The aortic valve is tricuspid. Aortic valve regurgitation is not visualized. Aortic valve sclerosis is present, with no evidence of aortic valve stenosis. Comparison(s): No significant change from prior study. FINDINGS  Left Ventricle: Left ventricular ejection fraction, by estimation, is 50%. The left ventricle has low normal function. The left ventricle has no regional wall motion abnormalities. Definity contrast agent was given IV to delineate the left ventricular endocardial borders. The left ventricular internal cavity size was normal in size. There is no left ventricular hypertrophy. Left ventricular diastolic parameters are indeterminate. Right Ventricle: The right ventricular size is normal. No increase in right ventricular wall thickness. Right ventricular systolic function is hyperdynamic. There is normal pulmonary artery systolic pressure. The tricuspid regurgitant velocity is 2.38 m/s, and with an assumed right atrial pressure of 3 mmHg, the estimated right ventricular systolic pressure is 93.8 mmHg. Left Atrium: Left atrial size was normal in size. Right Atrium: Right atrial size was normal in size. Pericardium: There is no evidence of pericardial effusion. Mitral Valve: The mitral valve is normal in structure. Mild mitral valve regurgitation. No evidence of mitral valve stenosis. Tricuspid Valve: The tricuspid valve is normal in structure. Tricuspid valve regurgitation is mild . No evidence of tricuspid stenosis. Aortic Valve: The aortic valve is tricuspid. Aortic valve regurgitation is not visualized. Aortic valve sclerosis is present, with no evidence of aortic valve stenosis. Pulmonic Valve: The pulmonic valve was not well visualized. Pulmonic valve  regurgitation is not visualized. No evidence of pulmonic stenosis. Aorta: The aortic root and ascending aorta are structurally normal, with no evidence of dilitation. IAS/Shunts: No atrial level shunt detected by color flow Doppler.  LEFT VENTRICLE PLAX 2D LVIDd:         5.90 cm      Diastology LVIDs:         3.20 cm      LV e' medial:    4.90 cm/s LV PW:         0.70 cm      LV E/e' medial:  17.1 LV IVS:        0.70 cm      LV e' lateral:   7.29 cm/s LVOT diam:     2.40 cm      LV E/e' lateral: 11.5 LV SV:         62 LV SV Index:   30 LVOT Area:     4.52 cm  LV Volumes (MOD) LV vol d, MOD A2C: 149.0 ml LV vol d, MOD A4C: 183.0 ml LV vol s, MOD A2C: 72.3 ml LV vol s, MOD A4C: 95.2 ml LV SV MOD A2C:     76.7 ml LV SV MOD A4C:     183.0 ml LV SV MOD BP:      85.9 ml RIGHT VENTRICLE RV Basal diam:  3.95 cm RV Mid diam:    3.10 cm RV S prime:     17.80 cm/s TAPSE (M-mode): 2.7 cm LEFT ATRIUM             Index LA diam:        3.60 cm 1.76 cm/m LA Vol (A2C):   59.1 ml 28.95 ml/m LA Vol (A4C):   63.0 ml 30.86 ml/m LA Biplane Vol: 65.1 ml 31.89 ml/m  AORTIC VALVE LVOT Vmax:   78.30 cm/s  LVOT Vmean:  54.500 cm/s LVOT VTI:    0.136 m  AORTA Ao Root diam: 3.60 cm Ao Asc diam:  3.50 cm MITRAL VALVE               TRICUSPID VALVE MV Area (PHT): 4.46 cm    TR Peak grad:   22.7 mmHg MV Decel Time: 170 msec    TR Vmax:        238.00 cm/s MV E velocity: 84.00 cm/s MV A velocity: 50.60 cm/s  SHUNTS MV E/A ratio:  1.66        Systemic VTI:  0.14 m                            Systemic Diam: 2.40 cm Rudean Haskell MD Electronically signed by Rudean Haskell MD Signature Date/Time: 08/12/2021/1:21:14 PM    Final    DG Chest 2 View  Result Date: 08/11/2021 CLINICAL DATA:  Shortness of breath. Lower extremity swelling. Weakness. Pancytopenia. EXAM: CHEST - 2 VIEW COMPARISON:  08/04/2021 FINDINGS: The heart size and mediastinal contours are within normal limits. Low lung volumes noted. Tiny right posterior pleural effusion  or thickening noted on the lateral projection. Both lungs are otherwise clear. The visualized skeletal structures are unremarkable. IMPRESSION: Tiny right posterior pleural effusion versus thickening. No other acute findings. Electronically Signed   By: Marlaine Hind M.D.   On: 08/11/2021 15:11   MR LUMBAR SPINE WO CONTRAST  Result Date: 08/04/2021 CLINICAL DATA:  Lumbar spinal stenosis. Fall. Bilateral lower extremity pain and weakness. EXAM: MRI LUMBAR SPINE WITHOUT CONTRAST TECHNIQUE: Multiplanar, multisequence MR imaging of the lumbar spine was performed. No intravenous contrast was administered. 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 Alignment: Unchanged grade 1 retrolisthesis of L2 on L3 and L5 on S1 and grade 1 anterolisthesis of L4 on L5. Vertebrae: No fracture or suspicious marrow lesion. Mild degenerative endplate edema at D2-2 and L2-3. Conus medullaris and cauda equina: Conus extends to the L1-2 level and is normal in signal. Redundancy of the cauda equina related to high-grade spinal stenosis. Paraspinal and other soft tissues: Unremarkable. Disc levels: Disc desiccation and disc space narrowing throughout the lumbar spine with disc narrowing being most severe at L1-2, L2-3, and L5-S1. T12-L1: Negative. L1-2: Disc bulging, endplate spurring, and moderate facet hypertrophy result in borderline to mild bilateral neural foraminal stenosis without spinal stenosis, unchanged. L2-3: Circumferential disc bulging, prominent dorsal epidural fat, and moderate to severe facet and ligamentum flavum hypertrophy result in severe spinal stenosis and moderate bilateral neural foraminal stenosis, stable to slightly progressed. L3-4: Circumferential disc bulging, prominent dorsal epidural fat, and moderate to severe facet and ligamentum flavum hypertrophy result in moderate spinal stenosis and mild-to-moderate bilateral neural  foraminal stenosis, slightly progressed. Bilateral facet joint effusions. L4-5: Anterolisthesis with bulging uncovered disc and severe facet and ligamentum flavum hypertrophy result in severe spinal stenosis (3 mm AP spinal canal diameter) and mild bilateral neural foraminal stenosis, unchanged. Moderate bilateral facet joint effusions. L5-S1: Disc bulging and endplate spurring eccentric to the right and moderate facet hypertrophy result in moderate right neural foraminal stenosis without spinal stenosis, unchanged. S1-2: Rudimentary disc.  No stenosis. IMPRESSION: 1. 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 2. Stable to slight progression of severe spinal stenosis and moderate neural foraminal stenosis at L2-3. 3. Slight progression of moderate spinal stenosis at L3-4. Electronically Signed   By: Zenia Resides  Jeralyn Ruths M.D.   On: 08/04/2021 20:22   DG Chest 1 View  Result Date: 08/04/2021 CLINICAL DATA:  Fall. EXAM: CHEST  1 VIEW COMPARISON:  Chest x-ray dated December 30, 2020. FINDINGS: Stable cardiomediastinal silhouette. Normal pulmonary vascularity. Trace left pleural effusion. No consolidation or pneumothorax. No acute osseous abnormality. IMPRESSION: 1. Trace left pleural effusion. Electronically Signed   By: Titus Dubin M.D.   On: 08/04/2021 11:34   DG Lumbar Spine Complete  Result Date: 08/04/2021 CLINICAL DATA:  Fall . EXAM: LUMBAR SPINE - COMPLETE 4+ VIEW COMPARISON:  None Available. FINDINGS: No evidence for an acute fracture. There is diffuse loss of intervertebral disc height in the lumbar spine with diffuse endplate degeneration. Trace anterolisthesis of L3 on 4 evident. IMPRESSION: Negative. Electronically Signed   By: Misty Stanley M.D.   On: 08/04/2021 11:30    Microbiology: Results for orders placed or performed during the hospital encounter of 08/11/21  C Difficile Quick Screen w PCR reflex     Status: None   Collection Time: 08/11/21  5:35 PM   Specimen: STOOL  Result Value Ref  Range Status   C Diff antigen NEGATIVE NEGATIVE Final   C Diff toxin NEGATIVE NEGATIVE Final   C Diff interpretation No C. difficile detected.  Final    Comment: Performed at [facility], [place] [address] [place], [place] [address]  Resp Panel by RT-PCR (Flu A&B, Covid) Anterior Nasal Swab     Status: None   Collection Time: 08/11/21  6:08 PM   Specimen: Anterior Nasal Swab  Result Value Ref Range Status   SARS Coronavirus 2 by RT PCR NEGATIVE NEGATIVE Final    Comment: (NOTE) SARS-CoV-2 target nucleic acids are NOT DETECTED.  The SARS-CoV-2 RNA is generally detectable in upper respiratory specimens during the acute phase of infection. The lowest concentration of SARS-CoV-2 viral copies this assay can detect is 138 copies/mL. A negative result does not preclude SARS-Cov-2 infection and should not be used as the sole basis for treatment or other patient management decisions. A negative result may occur with  improper specimen collection/handling, submission of specimen other than nasopharyngeal swab, presence of viral mutation(s) within the areas targeted by this assay, and inadequate number of viral copies(<138 copies/mL). A negative result must be combined with clinical observations, patient history, and epidemiological information. The expected result is Negative.  Fact Sheet for Patients:  EntrepreneurPulse.com.au  Fact Sheet for Healthcare Providers:  IncredibleEmployment.be  This test is no t yet approved or cleared by the [place] FDA and  has been authorized for detection and/or diagnosis of SARS-CoV-2 by FDA under an Emergency Use Authorization (EUA). This EUA will remain  in effect (meaning this test can be used) for the duration of the COVID-19 declaration under Section 564(b)(1) of the Act, 21 U.S.C.section 360bbb-3(b)(1), unless the authorization is terminated  or revoked sooner.       Influenza A by PCR  NEGATIVE NEGATIVE Final   Influenza B by PCR NEGATIVE NEGATIVE Final    Comment: (NOTE) The Xpert Xpress SARS-CoV-2/FLU/RSV plus assay is intended as an aid in the diagnosis of influenza from Nasopharyngeal swab specimens and should not be used as a sole basis for treatment. Nasal washings and aspirates are unacceptable for Xpert Xpress SARS-CoV-2/FLU/RSV testing.  Fact Sheet for Patients: EntrepreneurPulse.com.au  Fact Sheet for Healthcare Providers: IncredibleEmployment.be  This test is not yet approved or cleared by the [place] FDA and has been authorized for detection and/or diagnosis of  SARS-CoV-2 by FDA under an Emergency Use Authorization (EUA). This EUA will remain in effect (meaning this test can be used) for the duration of the COVID-19 declaration under Section 564(b)(1) of the Act, 21 U.S.C. section 360bbb-3(b)(1), unless the authorization is terminated or revoked.  Performed at [facility], [place] [address], [place], [place] [address]   Culture, blood (Routine X 2) w Reflex to ID Panel     Status: None (Preliminary result)   Collection Time: 08/12/21 10:39 AM   Specimen: Left Antecubital; Blood  Result Value Ref Range Status   Specimen Description   Final    LEFT ANTECUBITAL Performed at [place] [address] [place], [place] [address]    Special Requests   Final    BOTTLES DRAWN AEROBIC AND ANAEROBIC Blood Culture results may not be optimal due to an inadequate volume of blood received in culture bottles Performed at [facility] [address], [place], [place] [address]    Culture   Final    NO GROWTH 4 DAYS Performed at [facility], [place] [address] [place], [place] [address]    Report Status PENDING  Incomplete  Culture, blood (Routine X 2) w Reflex to ID Panel     Status: None (Preliminary result)   Collection Time: 08/12/21 10:39 AM    Specimen: BLOOD LEFT HAND  Result Value Ref Range Status   Specimen Description   Final    BLOOD LEFT HAND Performed at [place] [address] [place], [place] [address]    Special Requests   Final    BOTTLES DRAWN AEROBIC AND ANAEROBIC Blood Culture adequate volume Performed at [facility] [address] [place], [place] [address]    Culture   Final    NO GROWTH 4 DAYS Performed at [facility], [place] [address], [place], [place] [address]    Report Status PENDING  Incomplete  Gastrointestinal Panel by PCR , Stool     Status: None   Collection Time: 08/13/21 10:05 AM   Specimen: Stool  Result Value Ref Range Status   Campylobacter species NOT DETECTED NOT DETECTED Final   Plesimonas shigelloides NOT DETECTED NOT DETECTED Final   Salmonella species NOT DETECTED NOT DETECTED Final   Yersinia enterocolitica NOT DETECTED NOT DETECTED Final   Vibrio species NOT DETECTED NOT DETECTED Final   Vibrio cholerae NOT DETECTED NOT DETECTED Final   Enteroaggregative E coli (EAEC) NOT DETECTED NOT DETECTED Final   Enteropathogenic E coli (EPEC) NOT DETECTED NOT DETECTED Final   Enterotoxigenic E coli (ETEC) NOT DETECTED NOT DETECTED Final   Shiga like toxin producing E coli (STEC) NOT DETECTED NOT DETECTED Final   Shigella/Enteroinvasive E coli (EIEC) NOT DETECTED NOT DETECTED Final   Cryptosporidium NOT DETECTED NOT DETECTED Final   Cyclospora cayetanensis NOT DETECTED NOT DETECTED Final   Entamoeba histolytica NOT DETECTED NOT DETECTED Final   Giardia lamblia NOT DETECTED NOT DETECTED Final   Adenovirus F40/41 NOT DETECTED NOT DETECTED Final   Astrovirus NOT DETECTED NOT DETECTED Final   Norovirus GI/GII NOT DETECTED NOT DETECTED Final   Rotavirus A NOT DETECTED NOT DETECTED Final   Sapovirus (I, II, IV, and V) NOT DETECTED NOT DETECTED Final    Comment: Performed at [facility], [place] [place], [place] [address]     Labs: CBC: Recent Labs  Lab 08/10/21 1507 08/11/21 1449 08/13/21 0353 08/14/21 0331  WBC 5.6  6.0 4.9 4.9  NEUTROABS 3.9  --   --   --   HGB 9.7* 9.4* 8.2* 7.9*  HCT 29.3* 29.3* 25.3* 24.9*  MCV 90.7 91.3 92.7 90.9  PLT 168.0 202 193 722   Basic Metabolic Panel: Recent Labs  Lab 08/12/21 0350 08/13/21 0353 08/14/21 0331 08/15/21 0416 08/16/21 0420  NA 134* 137 137 139 138  K 3.8 3.8 4.1 4.0 4.0  CL 105 106 105 107 105  CO2 22 21* '24 24 25  '$ GLUCOSE 161* 148* 275* 212* 228*  BUN 30* 31* 40* 44* 40*  CREATININE 1.52* 1.69* 1.63* 1.30* 1.20  CALCIUM 8.2* 7.9* 8.2* 8.4* 8.3*   Liver Function Tests: Recent Labs  Lab 08/10/21 1507 08/13/21 0353 08/14/21 0331  AST 33 28 25  ALT '29 27 28  '$ ALKPHOS 73 66 66  BILITOT 0.9 1.3* 1.1  PROT 5.7* 5.2* 5.3*  ALBUMIN 3.1* 2.5* 2.5*   CBG: Recent Labs  Lab 08/15/21 1634 08/15/21 1800 08/15/21 2102 08/16/21 0808 08/16/21 1211  GLUCAP 68* 150* 305* 247* 258*    Discharge time spent: greater than 30 minutes.  Signed: Alma Friendly, MD [facility] 08/16/2021

## 2021-08-16 NOTE — Inpatient Diabetes Management (Signed)
Inpatient Diabetes Program Recommendations  AACE/ADA: New Consensus Statement on Inpatient Glycemic Control (2015)  Target Ranges:  Prepandial:   less than 140 mg/dL      Peak postprandial:   less than 180 mg/dL (1-2 hours)      Critically ill patients:  140 - 180 mg/dL   Lab Results  Component Value Date   GLUCAP 247 (H) 08/16/2021   HGBA1C 6.9 (H) 08/05/2021    Review of Glycemic Control  Latest Reference Range & Units 08/15/21 11:55 08/15/21 16:34 08/15/21 18:00 08/15/21 21:02 08/16/21 08:08  Glucose-Capillary 70 - 99 mg/dL 391 (H) 68 (L) 150 (H) 305 (H) 247 (H)  (H): Data is abnormally high (L): Data is abnormally low Home DM Meds: None Listed   Current Orders: Novolog Moderate Correction Scale/ SSI (0-15 units) TID AC + HS                            Novolog 4 units TID with meals Prednisone 20 units QD       Noted hypoglycemia of 68 mg/dL following meal coverage and correction yesterday.  Consider:   1. Start weight based Basal insulin: Semglee 8 units QHS (0.1 units/kg)   2. Decrease correction to Novolog 0-9 units TID & HS  Thanks, Bronson Curb, MSN, RNC-OB Diabetes Coordinator [phone] (8a-5p)

## 2021-08-17 ENCOUNTER — Telehealth: Payer: Self-pay

## 2021-08-17 LAB — CULTURE, BLOOD (ROUTINE X 2)
Culture: NO GROWTH
Culture: NO GROWTH
Special Requests: ADEQUATE

## 2021-08-17 NOTE — Telephone Encounter (Signed)
Pt is in an inpatient rehab facility- asked that pt call the office when he knew he would be discharging home to schedule fu appt with PCP and he agreed and voiced understanding

## 2021-08-22 ENCOUNTER — Telehealth: Payer: Self-pay | Admitting: Internal Medicine

## 2021-08-22 NOTE — Telephone Encounter (Signed)
Patient's son Cejay called and scheduled an appointment for 10/18/21 at 1:40 pm with Dr. Benjamine Mola.  He states his dad is experiencing a gout flair and his toes are red and requested a return call.

## 2021-08-23 NOTE — Telephone Encounter (Signed)
FYI- I spoke with patient and his son he has inflammation in hands and feet consistent with gout flares. This started at his hospitalization earlier this month hand pain and swelling and improved with short term prednisone. He followed up with cardiologist who prescribed short term colchicine that was also effective. Right now he is at rehab facility and trying to participate in PT but limited by symptoms especially foot. He has a NP provider at the site who has drawn labs, I recommended they continue to follow up with them since I do not have that information. Based on the previous situation I think it would be reasonable to try colchicine again if appropriate lab results and if not improved within 5 days or so prednisone.

## 2021-08-27 ENCOUNTER — Encounter (HOSPITAL_BASED_OUTPATIENT_CLINIC_OR_DEPARTMENT_OTHER): Payer: Self-pay | Admitting: Emergency Medicine

## 2021-08-27 ENCOUNTER — Emergency Department (HOSPITAL_BASED_OUTPATIENT_CLINIC_OR_DEPARTMENT_OTHER)
Admission: EM | Admit: 2021-08-27 | Discharge: 2021-08-27 | Disposition: A | Discharge status: Home / Self Care | Payer: Medicare Other | Attending: Emergency Medicine | Admitting: Emergency Medicine

## 2021-08-27 ENCOUNTER — Other Ambulatory Visit: Payer: Self-pay

## 2021-08-27 DIAGNOSIS — I130 Hypertensive heart and chronic kidney disease with heart failure and stage 1 through stage 4 chronic kidney disease, or unspecified chronic kidney disease: Secondary | ICD-10-CM | POA: Insufficient documentation

## 2021-08-27 DIAGNOSIS — Z794 Long term (current) use of insulin: Secondary | ICD-10-CM | POA: Diagnosis not present

## 2021-08-27 DIAGNOSIS — N179 Acute kidney failure, unspecified: Secondary | ICD-10-CM | POA: Diagnosis not present

## 2021-08-27 DIAGNOSIS — Z7901 Long term (current) use of anticoagulants: Secondary | ICD-10-CM | POA: Insufficient documentation

## 2021-08-27 DIAGNOSIS — Z79899 Other long term (current) drug therapy: Secondary | ICD-10-CM | POA: Insufficient documentation

## 2021-08-27 DIAGNOSIS — E1165 Type 2 diabetes mellitus with hyperglycemia: Secondary | ICD-10-CM | POA: Insufficient documentation

## 2021-08-27 DIAGNOSIS — Z8546 Personal history of malignant neoplasm of prostate: Secondary | ICD-10-CM | POA: Insufficient documentation

## 2021-08-27 DIAGNOSIS — N183 Chronic kidney disease, stage 3 unspecified: Secondary | ICD-10-CM | POA: Insufficient documentation

## 2021-08-27 DIAGNOSIS — I4891 Unspecified atrial fibrillation: Secondary | ICD-10-CM | POA: Diagnosis not present

## 2021-08-27 DIAGNOSIS — R739 Hyperglycemia, unspecified: Secondary | ICD-10-CM | POA: Diagnosis present

## 2021-08-27 DIAGNOSIS — I509 Heart failure, unspecified: Secondary | ICD-10-CM | POA: Diagnosis not present

## 2021-08-27 LAB — BASIC METABOLIC PANEL
Anion gap: 10 (ref 5–15)
BUN: 39 mg/dL — ABNORMAL HIGH (ref 8–23)
CO2: 22 mmol/L (ref 22–32)
Calcium: 9.1 mg/dL (ref 8.9–10.3)
Chloride: 100 mmol/L (ref 98–111)
Creatinine, Ser: 2.09 mg/dL — ABNORMAL HIGH (ref 0.61–1.24)
GFR, Estimated: 32 mL/min — ABNORMAL LOW (ref >60)
Glucose, Bld: 149 mg/dL — ABNORMAL HIGH (ref 70–99)
Potassium: 4.3 mmol/L (ref 3.5–5.1)
Sodium: 132 mmol/L — ABNORMAL LOW (ref 135–145)

## 2021-08-27 LAB — CBG MONITORING, ED: Glucose-Capillary: 207 mg/dL — ABNORMAL HIGH (ref 70–99)

## 2021-08-27 LAB — URINALYSIS, ROUTINE W REFLEX MICROSCOPIC
Bilirubin Urine: NEGATIVE
Glucose, UA: NEGATIVE mg/dL
Ketones, ur: NEGATIVE mg/dL
Leukocytes,Ua: NEGATIVE
Nitrite: NEGATIVE
Protein, ur: NEGATIVE mg/dL
Specific Gravity, Urine: 1.005 (ref 1.005–1.030)
pH: 7 (ref 5.0–8.0)

## 2021-08-27 LAB — CBC
HCT: 29.9 % — ABNORMAL LOW (ref 39.0–52.0)
Hemoglobin: 9.5 g/dL — ABNORMAL LOW (ref 13.0–17.0)
MCH: 28.4 pg (ref 26.0–34.0)
MCHC: 31.8 g/dL (ref 30.0–36.0)
MCV: 89.3 fL (ref 80.0–100.0)
Platelets: 240 10*3/uL (ref 150–400)
RBC: 3.35 MIL/uL — ABNORMAL LOW (ref 4.22–5.81)
RDW: 15.6 % — ABNORMAL HIGH (ref 11.5–15.5)
WBC: 7.7 10*3/uL (ref 4.0–10.5)
nRBC: 0 % (ref 0.0–0.2)

## 2021-08-27 MED ORDER — SODIUM CHLORIDE 0.9 % IV BOLUS
500.0000 mL | Freq: Once | INTRAVENOUS | Status: AC
  Administered 2021-08-27: 500 mL via INTRAVENOUS

## 2021-08-27 NOTE — ED Triage Notes (Signed)
Patient brought in for elevated blood sugar (259, 398). Patient had Lantus 14 units about 8 am, Novolog 10 units at 10:30. Blood sugar 340 at 11:00. Patient c/o blurred vision, increased thirst, increased urination and dizziness.

## 2021-08-27 NOTE — ED Provider Notes (Signed)
[facility] Provider Note   CSN: [id number] Arrival date & time: 08/27/21  1121     History  Chief Complaint  Patient presents with   Hyperglycemia    Charles Lloyd. is a 81 y.o. male with past medical history significant for prostate cancer, type 2 diabetes with insulin requirement, A-fib on anticoagulation, congestive heart failure, hypertension who presents with concern for elevated blood sugar at home.  Patient had significantly increased blood sugar this morning at 259, 398.  He had in his normal Lantus 14 units around 8 AM.  They gave him NovoLog 10 units at 1030.  At that time patient was complaining of some blurry vision, increased thirst, increased urination, dizziness.  He denies any chest pain, shortness of breath, nausea, vomiting, fever, chills.  He just recently was hospitalized due to a heart failure exacerbation as well as presumed tickborne illness, he had completed a course of prednisone, doxycycline, as well as diuretics.  He just finished prednisone course around 1 week ago.   Hyperglycemia Associated symptoms: increased thirst and polyuria        Home Medications Prior to Admission medications   Medication Sig Start Date End Date Taking? Authorizing Provider  albuterol (VENTOLIN HFA) 108 (90 Base) MCG/ACT inhaler Inhale 1-2 puffs into the lungs every 6 (six) hours as needed for wheezing or shortness of breath. Patient not taking: Reported on 08/11/2021 10/05/20   Parrett, Fonnie Mu, NP  allopurinol (ZYLOPRIM) 300 MG tablet Take 1 tablet (300 mg total) by mouth daily. Patient taking differently: Take 300 mg by mouth daily as needed (gout). 07/18/21   Isaac Bliss, [name], MD  atorvastatin (LIPITOR) 20 MG tablet TAKE 1 TABLET DAILY Patient taking differently: Take 20 mg by mouth daily. 06/14/21   Isaac Bliss, [name], MD  carvedilol (COREG) 3.125 MG tablet Take 1 tablet (3.125 mg total) by mouth 2 (two) times daily with a  meal. 08/16/21   Alma Friendly, MD  ELIQUIS 5 MG TABS tablet TAKE 1 TABLET TWICE A DAY Patient taking differently: Take 5 mg by mouth 2 (two) times daily. 06/15/21   Isaac Bliss, [name], MD  flecainide (TAMBOCOR) 100 MG tablet Take 1 tablet (100 mg total) by mouth 2 (two) times daily. 05/11/21   Camnitz, Will Hassell Done, MD  fluticasone (FLONASE) 50 MCG/ACT nasal spray Place 2 sprays into both nostrils daily. Patient taking differently: Place 2 sprays into both nostrils daily as needed for allergies. 01/07/19   Dana Allan I, MD  fluticasone furoate-vilanterol (BREO ELLIPTA) 100-25 MCG/INH AEPB Inhale 1 puff into the lungs daily. Patient taking differently: Inhale 1 puff into the lungs daily as needed (SOB). 10/05/20   Parrett, Fonnie Mu, NP  gabapentin (NEURONTIN) 300 MG capsule TAKE 2 CAPSULES BY MOUTH AT BEDTIME Patient taking differently: Take 600 mg by mouth at bedtime. 04/17/21   Isaac Bliss, [name], MD  glucose blood (ACCU-CHEK AVIVA PLUS) test strip 1 each by Other route daily. Dx E11.9 03/25/20   Isaac Bliss, [name], MD  insulin aspart (NOVOLOG) 100 UNIT/ML injection Inject 0-15 Units into the skin 3 (three) times daily with meals. 08/16/21   Alma Friendly, MD  insulin glargine-yfgn (SEMGLEE) 100 UNIT/ML injection Inject 0.08 mLs (8 Units total) into the skin daily. 08/16/21   Alma Friendly, MD  KLOR-CON M20 20 MEQ tablet Take 1 tablet (20 mEq total) by mouth daily. 08/15/21   Mercy Riding, MD  montelukast (SINGULAIR) 10 MG  tablet Take 10 mg by mouth at bedtime.     [provider]  NOVOLOG FLEXPEN 100 UNIT/ML FlexPen Inject into the skin. 08/16/21   [provider]  RESTASIS 0.05 % ophthalmic emulsion Place 1 drop into both eyes 2 (two) times daily as needed (dry eyes). 12/09/19   [provider]  SEMGLEE, YFGN, 100 UNIT/ML Pen Inject into the skin. 08/16/21   [provider]  torsemide (DEMADEX) 20 MG tablet Take 1  tablet (20 mg total) by mouth daily. 08/15/21   Mercy Riding, MD  triamcinolone cream (KENALOG) 0.1 % Apply 1 application. topically 2 (two) times daily. Patient taking differently: Apply 1 application  topically daily as needed (itching). 07/18/21   Isaac Bliss, [name], MD  valsartan (DIOVAN) 160 MG tablet Take 0.5 tablets (80 mg total) by mouth daily. Patient must keep April 2023 appointment for further refills. 3 rd/ final attempt 08/16/21   Alma Friendly, MD      Allergies    Patient has no known allergies.    Review of Systems   Review of Systems  Endocrine: Positive for polydipsia and polyuria.  All other systems reviewed and are negative.   Physical Exam Updated Vital Signs BP 136/69 (BP Location: Right Arm)   Pulse (!) 59   Temp 97.6 F (36.4 C) (Oral)   Resp (!) 21   Ht '5\' 10"'$  (1.778 m)   Wt 84.2 kg   SpO2 97%   BMI 26.63 kg/m  Physical Exam Vitals and nursing note reviewed.  Constitutional:      General: He is not in acute distress.    Appearance: Normal appearance.     Comments: Chronically ill-appearing but nontoxic, nonseptic appearing  HENT:     Head: Normocephalic and atraumatic.  Eyes:     General:        Right eye: No discharge.        Left eye: No discharge.  Cardiovascular:     Rate and Rhythm: Normal rate and regular rhythm.     Heart sounds: No murmur heard.    No friction rub. No gallop.  Pulmonary:     Effort: Pulmonary effort is normal.     Breath sounds: Normal breath sounds.  Abdominal:     General: Bowel sounds are normal.     Palpations: Abdomen is soft.  Skin:    General: Skin is warm and dry.     Capillary Refill: Capillary refill takes less than 2 seconds.  Neurological:     Mental Status: He is alert and oriented to person, place, and time.  Psychiatric:        Mood and Affect: Mood normal.        Behavior: Behavior normal.     ED Results / Procedures / Treatments   Labs (all labs ordered are listed, but only  abnormal results are displayed) Labs Reviewed  CBC - Abnormal; Notable for the following components:      Result Value   RBC 3.35 (*)    Hemoglobin 9.5 (*)    HCT 29.9 (*)    RDW 15.6 (*)    All other components within normal limits  BASIC METABOLIC PANEL - Abnormal; Notable for the following components:   Sodium 132 (*)    Glucose, Bld 149 (*)    BUN 39 (*)    Creatinine, Ser 2.09 (*)    GFR, Estimated 32 (*)    All other components within normal limits  URINALYSIS,  ROUTINE W REFLEX MICROSCOPIC - Abnormal; Notable for the following components:   Color, Urine COLORLESS (*)    Hgb urine dipstick TRACE (*)    All other components within normal limits  CBG MONITORING, ED - Abnormal; Notable for the following components:   Glucose-Capillary 207 (*)    All other components within normal limits    EKG None  Radiology No results found.  Procedures Procedures    Medications Ordered in ED Medications  sodium chloride 0.9 % bolus 500 mL (500 mLs Intravenous New Bag/Given 08/27/21 1417)    ED Course/ Medical Decision Making/ A&P Clinical Course as of 08/27/21 1459  Sun Aug 27, 2021  1349 Small fluid bolus, 1/2 dose torsemide for 3-4 days, PCP follow up [CP]    Clinical Course User Index [CP] Anselmo Pickler, PA-C                           Medical Decision Making Amount and/or Complexity of Data Reviewed Labs: ordered.   This patient is a 81 y.o. male who presents to the ED for concern of labile blood sugar, with hyperglycemia into the mid 300s earlier today, this involves an extensive number of treatment options, and is a complaint that carries with it a high risk of complications and morbidity. The emergent differential diagnosis prior to evaluation includes, but is not limited to, hyperglycemia, DKA, HHS, underlying cause of hyperglycemia including infectious abnormality, new heart failure exacerbation, kidney injury, versus other.   This is not an exhaustive  differential.   Past Medical History / Co-morbidities / Social History: prostate cancer, type 2 diabetes with insulin requirement, A-fib on anticoagulation, congestive heart failure, hypertension  Additional history: Chart reviewed. Pertinent results include: Reviewed lab work, imaging from patient's recent hospitalization, patient had been hospitalized for CHF exacerbation, presumed tickborne illness, he was just discharged on 621, he just completed a course of prednisone 1 week ago.  Physical Exam: Physical exam performed. The pertinent findings include: Patient is overall well-appearing, no acute distress, no overall stable vital signs.  Lab Tests: I ordered, and personally interpreted labs.  The pertinent results include: UA is unremarkable, CBG on arrival 207, repeat on BMP is within normal limits, at 149.  Minimal hyponatremia with sodium at 132.  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  CBC remarkable for anemia, hemoglobin 9.5, this is significantly improved from recent hospitalization values, patient without evidence of symptomatic anemia at this time.   Medications: I ordered medication including small fluid bolus for acute kidney injury.  Patient continues to be well-appearing, but he will require recheck with PCP to ensure resolution of AKI in the next week.  We will encourage him to have his dose of torsemide for the next 3 to 4 days, and follow-up with PCP for recheck   Disposition: After consideration of the diagnostic results and the patients response to treatment, I feel that patient appears stable for discharge at this time.  He endorses feeling somewhat better after maintaining euglycemia, and small fluid bolus for AKI.  We will encourage him to half his dose of torsemide for the next 3 to 4 days and  follow-up with his PCP for kidney recheck.  Do not think that he needs admission for AKI alone at this  time as he is overall well-appearing and has a history of CKD already.   emergency department workup does not suggest an emergent condition requiring admission or immediate intervention beyond what has been performed at this time. The plan is: As above. The patient is safe for discharge and has been instructed to return immediately for worsening symptoms, change in symptoms or any other concerns.  I discussed this case with my attending physician Dr. Almyra Free who cosigned this note including patient's presenting symptoms, physical exam, and planned diagnostics and interventions. Attending physician stated agreement with plan or made changes to plan which were implemented.    Final Clinical Impression(s) / ED Diagnoses Final diagnoses:  Hyperglycemia  AKI (acute kidney injury) [facility])    Rx / DC Orders ED Discharge Orders     None         Dorien Chihuahua 08/27/21 1459    Luna Fuse, MD 09/07/21 [phone]

## 2021-08-27 NOTE — Discharge Instructions (Signed)
Continue to drink a normal amount of fluids, do not significantly increase your oral intake because of your heart failure, however I do recommend that you take half of the dose of your normal diuretic torsemide for the next 3 to 4 days, and follow-up with your primary care doctor next week for recheck of your kidney function.  Continue to monitor your blood sugar and adjust your insulin as needed to maintain normal blood sugar as this can also cause some kidney injury.  If you have worsening symptoms, or additional concerns please return to the emergency department for further evaluation or follow-up with your primary care doctor.

## 2021-08-27 NOTE — ED Notes (Signed)
RT note: Pt. given 1 cup of water after PA asked with RN notified.

## 2021-08-28 ENCOUNTER — Other Ambulatory Visit: Payer: Self-pay | Admitting: Internal Medicine

## 2021-08-28 ENCOUNTER — Ambulatory Visit: Payer: Medicare Other | Admitting: Internal Medicine

## 2021-08-28 ENCOUNTER — Encounter: Payer: Self-pay | Admitting: Internal Medicine

## 2021-08-28 ENCOUNTER — Telehealth: Payer: Self-pay | Admitting: Internal Medicine

## 2021-08-28 VITALS — BP 112/68 | HR 62 | Temp 97.3°F | Wt 174.0 lb

## 2021-08-28 DIAGNOSIS — R739 Hyperglycemia, unspecified: Secondary | ICD-10-CM

## 2021-08-28 DIAGNOSIS — E1142 Type 2 diabetes mellitus with diabetic polyneuropathy: Secondary | ICD-10-CM

## 2021-08-28 LAB — POCT GLUCOSE (DEVICE FOR HOME USE): POC Glucose: 108 mg/dl — AB (ref 70–99)

## 2021-08-28 MED ORDER — FREESTYLE LIBRE 2 READER DEVI: 1.0000 | Freq: Every day | 2 refills | Status: DC

## 2021-08-28 MED ORDER — FREESTYLE LIBRE 2 SENSOR MISC: 1.0000 | Freq: Every day | 6 refills | Status: DC

## 2021-08-28 NOTE — Telephone Encounter (Signed)
Glucose was 337 this morning, gave him 8 units of novolog and it went up even more. Offered OV, declined, trans to triage nurse. Requesting refill of glucose blood (ACCU-CHEK AVIVA PLUS) test strip

## 2021-08-28 NOTE — Telephone Encounter (Signed)
Refill sent.

## 2021-08-28 NOTE — Addendum Note (Signed)
Addended by: Westley Hummer B on: 08/28/2021 04:18 PM   Modules accepted: Orders

## 2021-08-28 NOTE — Progress Notes (Signed)
Established Patient Office Visit     CC/Reason for Visit: Hyperglycemia  HPI: Charles Lloyd. is a 81 y.o. male who is coming in today for the above mentioned reasons.  He called the office today to report a blood sugar of 380 and was asked to schedule a visit.  He has a history of diabetes and is on glargine as well as NovoLog.  5 days ago he had a steroid injection to his back.  I suspect this is causing the hyperglycemia.  His blood sugar in office today is 108.  Past Medical/Surgical History: Past Medical History:  Diagnosis Date   Acute tracheobronchitis 01/05/2019   B12 deficiency    Basal cell carcinoma (BCC) of left side of nose 01/28/2018   Cancer ([place])    Cardiomyopathy ([place]) 03/10/2018   With chronic atrial fibrillation   CHF (congestive heart failure) ([place]) 04/16/2013   Functional class II, ejection fraction 35-40%  Formatting of this note might be different from the original. Functional class II, ejection fraction 35-40%   Chronic anticoagulation    Chronic diastolic (congestive) heart failure ([place]) 04/16/2013   Functional class II, ejection fraction 35-40%  Last Assessment & Plan:  Clinically stable Volume well controlled meds reviewed   Chronic diastolic CHF (congestive heart failure) ([place]) 04/16/2013   Functional class II, ejection fraction 35-40%  Last Assessment & Plan:  Clinically stable Volume well controlled meds reviewed   Colon polyps 04/16/2013   Diabetic neuropathy (HCC)    Eosinophil count raised 02/17/2019   Essential hypertension 01/06/2010   Last Assessment & Plan:  Well controlled Continue med management   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  Steroid usage was discontinued several months ago.  Follow up with Dermatology as planned.   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   Last Assessment & Plan:  Id following ?ongoing abx managementy reported Request records   Infection of prosthetic right knee joint ([place])    Insomnia 03/04/2012   Grief with loss of wife 02/20/12  Last Assessment & Plan:  Improved sx  contineu xanax prn Se discussed   Lesion of liver 04/20/2019   -On cardiac CT 02/2019   Mild reactive airways disease    Neoplasm of prostate 04/16/2013   Neoplasm of prostate, 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 Transthoracic Echocardiogram-03/10/[facility]: Normal left ventricular wall thickness and cavity size.  Global left ventricular systolic function is moderately reduced.  The estimated ejection fraction is 35-40%.  The left atrium is moderately enlarged.  The right atrium is mildly enlarged.  No significant valvular    Overweight (BMI 25.0-29.9) 10/22/2016   Persistent atrial fibrillation ([place]) 04/16/2013   Last Assessment & Plan:  Rate controlled Continue med management eliquis for stroke preventino  Formatting of this note might be different from the original.  Drug  HX Current Rx Pre-ABL inefficacy Pre-ABL intolerant Post-ABL inefficacy Post-ABL intolerant max dose/24h M/Y end comments  sotalol                  dofetilide                  flecainide                  propafenone                  am   S/P TKR (total knee replacement), bilateral    Secondary hypercoagulable state ([place]) 04/09/2019   Tinea cruris 04/16/2013   Type 2 diabetes mellitus  with diabetic polyneuropathy, without long-term current use of insulin ([place]) 04/16/2013   Last Assessment & Plan:  Labs today Pt reports well controlled on ambulatory monitoring   Vitamin D deficiency 07/25/2018    Past Surgical History:  Procedure Laterality Date   ATRIAL FIBRILLATION ABLATION N/A 03/12/2019   Procedure: ATRIAL FIBRILLATION ABLATION;  Surgeon: [name], MD;  Location: [facility];  Service: Cardiovascular;  Laterality: N/A;   ATRIAL FIBRILLATION ABLATION N/A 04/29/2020   Procedure: ATRIAL FIBRILLATION ABLATION;  Surgeon: [name], MD;  Location: [facility];  Service: Cardiovascular;  Laterality: N/A;   CARDIAC ELECTROPHYSIOLOGY STUDY AND ABLATION     CARDIOVERSION     CARDIOVERSION N/A 10/10/2018   Procedure: CARDIOVERSION;  Surgeon: [name], MD;  Location: [facility] ENDOSCOPY;  Service: Cardiovascular;  Laterality: N/A;   CARDIOVERSION N/A 05/18/2020   Procedure: CARDIOVERSION;  Surgeon: [name], MD;  Location: [facility] ENDOSCOPY;  Service: Cardiovascular;  Laterality: N/A;   CARDIOVERSION N/A 12/09/2020   Procedure: CARDIOVERSION;  Surgeon: Lelon Perla, MD;  Location: [facility] ENDOSCOPY;  Service: Cardiovascular;  Laterality: N/A;   CHOLECYSTECTOMY     PROSTATECTOMY     REPLACEMENT TOTAL KNEE BILATERAL      Social History:  reports that he has never smoked. He has never used smokeless tobacco. He reports current alcohol use of about 14.0 standard drinks of alcohol per week. He reports that he does not use drugs.  Allergies: No Known Allergies  Family History:  Family History  Problem Relation Age of Onset   Cancer Mother    Depression Mother    Early death Mother    Cancer Father    Depression Father    Early death Father      Current Outpatient Medications:    ACCU-CHEK AVIVA PLUS test strip, 1 EACH BY OTHER ROUTE DAILY. DX E11.9, Disp: 100 strip, Rfl: 12   albuterol (VENTOLIN HFA) 108 (90 Base) MCG/ACT inhaler,  Inhale 1-2 puffs into the lungs every 6 (six) hours as needed for wheezing or shortness of breath., Disp: 8 g, Rfl: 2   allopurinol (ZYLOPRIM) 300 MG tablet, Take 1 tablet (300 mg total) by mouth daily. (Patient taking differently: Take 300 mg by mouth daily as needed (gout).), Disp: 90 tablet, Rfl: 1   atorvastatin (LIPITOR) 20 MG tablet, TAKE 1 TABLET DAILY (Patient taking differently: Take 20 mg by mouth daily.), Disp: 90 tablet, Rfl: 1   carvedilol (COREG) 3.125 MG tablet, Take 1 tablet (3.125 mg total) by mouth 2 (two) times daily with a meal., Disp: , Rfl:    ELIQUIS 5 MG TABS tablet, TAKE 1 TABLET TWICE A DAY (Patient taking differently: Take 5 mg by mouth 2 (two) times daily.), Disp: 180 tablet, Rfl: 0   flecainide (  TAMBOCOR) 100 MG tablet, Take 1 tablet (100 mg total) by mouth 2 (two) times daily., Disp: 180 tablet, Rfl: 1   fluticasone (FLONASE) 50 MCG/ACT nasal spray, Place 2 sprays into both nostrils daily. (Patient taking differently: Place 2 sprays into both nostrils daily as needed for allergies.), Disp: 16 g, Rfl: 2   fluticasone furoate-vilanterol (BREO ELLIPTA) 100-25 MCG/INH AEPB, Inhale 1 puff into the lungs daily. (Patient taking differently: Inhale 1 puff into the lungs daily as needed (SOB).), Disp: 60 each, Rfl: 11   gabapentin (NEURONTIN) 300 MG capsule, TAKE 2 CAPSULES BY MOUTH AT BEDTIME (Patient taking differently: Take 600 mg by mouth at bedtime.), Disp: 60 capsule, Rfl: 2   insulin aspart (NOVOLOG) 100 UNIT/ML injection, Inject 0-15 Units into the skin 3 (three) times daily with meals., Disp: 10 mL, Rfl: 0   insulin glargine-yfgn (SEMGLEE) 100 UNIT/ML injection, Inject 0.08 mLs (8 Units total) into the skin daily., Disp: 10 mL, Rfl: 11   KLOR-CON M20 20 MEQ tablet, Take 1 tablet (20 mEq total) by mouth daily., Disp: , Rfl:    montelukast (SINGULAIR) 10 MG tablet, Take 10 mg by mouth at bedtime. , Disp: , Rfl:    NOVOLOG FLEXPEN 100 UNIT/ML FlexPen, Inject into the skin.,  Disp: , Rfl:    RESTASIS 0.05 % ophthalmic emulsion, Place 1 drop into both eyes 2 (two) times daily as needed (dry eyes)., Disp: , Rfl:    SEMGLEE, YFGN, 100 UNIT/ML Pen, Inject into the skin., Disp: , Rfl:    torsemide (DEMADEX) 20 MG tablet, Take 1 tablet (20 mg total) by mouth daily., Disp: , Rfl:    triamcinolone cream (KENALOG) 0.1 %, Apply 1 application. topically 2 (two) times daily. (Patient taking differently: Apply 1 application  topically daily as needed (itching).), Disp: 453 g, Rfl: 0   valsartan (DIOVAN) 160 MG tablet, Take 0.5 tablets (80 mg total) by mouth daily. Patient must keep April 2023 appointment for further refills. 3 rd/ final attempt, Disp: 90 tablet, Rfl: 0  Review of Systems:  Constitutional: Denies fever, chills, diaphoresis, appetite change and fatigue.  HEENT: Denies photophobia, eye pain, redness, hearing loss, ear pain, congestion, sore throat, rhinorrhea, sneezing, mouth sores, trouble swallowing, neck pain, neck stiffness and tinnitus.   Respiratory: Denies SOB, DOE, cough, chest tightness,  and wheezing.   Cardiovascular: Denies chest pain, palpitations and leg swelling.  Gastrointestinal: Denies nausea, vomiting, abdominal pain, diarrhea, constipation, blood in stool and abdominal distention.  Genitourinary: Denies dysuria, urgency, frequency, hematuria, flank pain and difficulty urinating.  Endocrine: Denies: hot or cold intolerance, sweats, changes in hair or nails, polyuria, polydipsia. Musculoskeletal: Denies myalgias, back pain, joint swelling, arthralgias and gait problem.  Skin: Denies pallor, rash and wound.  Neurological: Denies dizziness, seizures, syncope, weakness, light-headedness, numbness and headaches.  Hematological: Denies adenopathy. Easy bruising, personal or family bleeding history  Psychiatric/Behavioral: Denies suicidal ideation, mood changes, confusion, nervousness, sleep disturbance and agitation    Physical Exam: Vitals:    08/28/21 1532  BP: 112/68  Pulse: 62  Temp: (!) 97.3 F (36.3 C)  TempSrc: Oral  SpO2: 97%  Weight: 174 lb (78.9 kg)    Body mass index is 24.97 kg/m.   Constitutional: NAD, calm, comfortable Eyes: PERRL, lids and conjunctivae normal ENMT: Mucous membranes are moist.  Respiratory: clear to auscultation bilaterally, no wheezing, no crackles. Normal respiratory effort. No accessory muscle use.  Cardiovascular: Regular rate and rhythm, no murmurs / rubs / gallops. No extremity edema. Psychiatric: Normal  judgment and insight. Alert and oriented x 3. Normal mood.    Impression and Plan:  Hyperglycemia  - Plan: POCT Glucose (Device for Home Use) -Suspect hyperglycemia due to recent epidural steroid injection, he may just have a transient increase in insulin requirement. -I have arranged for him to get a continuous glucose monitor and have added a nurse to his home health services.   Time spent:23 minutes reviewing chart, interviewing and examining patient and formulating plan of care.    Lelon Frohlich, MD [facility] at [facility]

## 2021-08-30 ENCOUNTER — Telehealth: Payer: Self-pay | Admitting: Internal Medicine

## 2021-08-30 MED ORDER — INSULIN GLARGINE-YFGN 100 UNIT/ML ~~LOC~~ SOLN: 8.0000 [IU] | Freq: Every day | SUBCUTANEOUS | 11 refills | Status: DC

## 2021-08-30 NOTE — Telephone Encounter (Signed)
Rx sent in electronically.  

## 2021-08-30 NOTE — Telephone Encounter (Signed)
Ok to fill was never prescribed by you from what I can see. Please verify

## 2021-08-30 NOTE — Telephone Encounter (Signed)
Pt requesting a refill of insulin aspart (NOVOLOG) 100 UNIT/ML injection   [id number] [address]:  [phone] Fax:  [phone]

## 2021-08-30 NOTE — Telephone Encounter (Signed)
Noted  

## 2021-08-31 ENCOUNTER — Telehealth: Payer: Self-pay | Admitting: Internal Medicine

## 2021-08-31 NOTE — Telephone Encounter (Signed)
Charles Lloyd from [facility] call and stated he need a verbal order for 1 X a wk for 1 wk and 2 x a wk for 2 wk's and also 1 x a wk for 3 wk's.

## 2021-08-31 NOTE — Telephone Encounter (Signed)
Left a detailed message on Charles Lloyd's voicemail with the approval as below.

## 2021-09-04 ENCOUNTER — Telehealth: Payer: Self-pay | Admitting: Internal Medicine

## 2021-09-04 NOTE — Telephone Encounter (Signed)
Crystal lpn with [facility] is calling and would like verbal order  for nursing evaluation for this patient

## 2021-09-05 NOTE — Telephone Encounter (Signed)
Left detailed message on machine for Crystal with verbal orders.

## 2021-09-11 ENCOUNTER — Ambulatory Visit: Payer: Medicare Other | Admitting: Cardiology

## 2021-09-21 ENCOUNTER — Telehealth: Payer: Self-pay | Admitting: Cardiology

## 2021-09-21 ENCOUNTER — Other Ambulatory Visit: Payer: Self-pay | Admitting: Cardiology

## 2021-09-21 ENCOUNTER — Telehealth: Payer: Self-pay

## 2021-09-21 DIAGNOSIS — I10 Essential (primary) hypertension: Secondary | ICD-10-CM

## 2021-09-21 NOTE — Telephone Encounter (Signed)
New Message:      Patient says he is having an injection in his back on 09-27-21. His question is, does he need to stop his Eliquis and if so for how long?

## 2021-09-21 NOTE — Telephone Encounter (Signed)
Pt called stated that he would be getting the same injection procedure, same medication and same physician as the one in June. The last pre-op clearance of August 10, 2021 is still current and there has been no changes in the pts medical history or medications. He will hold Eliquis for 3 days prior to procedure as stated in previous medical clearance.

## 2021-09-22 ENCOUNTER — Telehealth: Payer: Self-pay | Admitting: Cardiology

## 2021-09-22 ENCOUNTER — Other Ambulatory Visit: Payer: Self-pay

## 2021-09-22 DIAGNOSIS — I10 Essential (primary) hypertension: Secondary | ICD-10-CM

## 2021-09-22 MED ORDER — VALSARTAN 160 MG PO TABS: 160.0000 mg | ORAL_TABLET | Freq: Every day | ORAL | 1 refills | Status: DC

## 2021-09-22 NOTE — Telephone Encounter (Signed)
Spoke with pt to clarify dose of valsartan. He stated that he takes '160mg'$  1 tablet daily. Ref sent to [facility].

## 2021-09-22 NOTE — Telephone Encounter (Signed)
*  STAT* If patient is at the pharmacy, call can be transferred to refill team.   1. Which medications need to be refilled? (please list name of each medication and dose if known)  valsartan (DIOVAN) 160 MG tablet  2. Which pharmacy/location (including street and city if local pharmacy) is medication to be sent to? [id number] [place] [place]. Do they need a 30 day or 90 day supply? 91 Pt is completely out and pharmacy said no more refills. Please advise

## 2021-09-27 ENCOUNTER — Telehealth: Payer: Self-pay | Admitting: Internal Medicine

## 2021-09-27 NOTE — Telephone Encounter (Signed)
Patient stopped by because he needs refills on NOVOLOG FLEXPEN 100 UNIT/ML FlexPen and SEMGLEE, YFGN, 100 UNIT/ML Pen Patient has gotten another steroid shot which has caused his blood sugar to increase again.  Patient got these originally in the rehab center but he is no longer in there.    [facility]  [id number] [place] [place] Phone:  [phone] Fax:  [phone]       Please advise

## 2021-09-28 ENCOUNTER — Other Ambulatory Visit: Payer: Self-pay | Admitting: Internal Medicine

## 2021-09-28 MED ORDER — INSULIN GLARGINE-YFGN 100 UNIT/ML ~~LOC~~ SOLN
8.0000 [IU] | Freq: Every day | SUBCUTANEOUS | 11 refills | Status: DC
Start: 2021-09-28 — End: 2021-10-02

## 2021-09-28 MED ORDER — INSULIN ASPART 100 UNIT/ML IJ SOLN: 0.0000 [IU] | Freq: Three times a day (TID) | INTRAMUSCULAR | 11 refills | Status: DC

## 2021-09-28 MED ORDER — INSULIN GLARGINE-YFGN 100 UNIT/ML ~~LOC~~ SOLN: 8.0000 [IU] | Freq: Every day | SUBCUTANEOUS | 11 refills | Status: DC

## 2021-09-28 NOTE — Addendum Note (Signed)
Addended by: Erline Hau on: 09/28/2021 06:58 AM   Modules accepted: Orders

## 2021-09-29 ENCOUNTER — Telehealth: Payer: Self-pay | Admitting: Internal Medicine

## 2021-09-29 MED ORDER — LANTUS SOLOSTAR 100 UNIT/ML ~~LOC~~ SOPN: 8.0000 [IU] | PEN_INJECTOR | Freq: Every day | SUBCUTANEOUS | 0 refills | Status: DC

## 2021-09-29 NOTE — Telephone Encounter (Signed)
Rx sent and pt aware 

## 2021-09-29 NOTE — Telephone Encounter (Signed)
See previous phone note in which the Rx was changed to Lantus.

## 2021-09-29 NOTE — Telephone Encounter (Signed)
Patient stopped by regarding his insulin. Patient states that pharmacy stated insurance needs more information before they will pay for the prescriptions. I called pharmacy to clarify and they stated that insurance will only pay for Lantus and possibly humalog, but will not the novolog or semglee. Pharmacy tech also stated that Lantus could be sent in the same doses as the novolog and semglee and it should go through. They will also resend PA for Novolog and Semglee just in case    [facility]  [id number] [place] [place] Phone:  [phone] Fax:  [phone]      Please advise

## 2021-10-01 ENCOUNTER — Other Ambulatory Visit: Payer: Self-pay | Admitting: Internal Medicine

## 2021-10-02 ENCOUNTER — Encounter: Payer: Self-pay | Admitting: Cardiology

## 2021-10-02 ENCOUNTER — Ambulatory Visit (INDEPENDENT_AMBULATORY_CARE_PROVIDER_SITE_OTHER): Payer: Medicare Other | Admitting: Cardiology

## 2021-10-02 VITALS — BP 166/80 | HR 68 | Ht 70.0 in | Wt 173.0 lb

## 2021-10-02 DIAGNOSIS — D6869 Other thrombophilia: Secondary | ICD-10-CM

## 2021-10-02 DIAGNOSIS — I5022 Chronic systolic (congestive) heart failure: Secondary | ICD-10-CM | POA: Diagnosis not present

## 2021-10-02 NOTE — Progress Notes (Signed)
Electrophysiology Office Note   Date:  10/02/2021   ID:  Charles Willmann., DOB 03-22-1940, MRN [id number]  PCP:  Charles Lloyd, Charles Halsted, Charles Lloyd  Cardiologist:  Charles Lloyd Primary Electrophysiologist:  Charles Haslem Meredith Leeds, Charles Lloyd    Chief Complaint: atrial fibrillation    History of Present Illness: Charles Derosia. is a 81 y.o. male who is being seen today for the evaluation of atrial fibrillation at the request of Charles Lloyd. Presenting today for electrophysiology evaluation.  He has a history significant for atrial fibrillation, mild systolic heart failure, diabetes, hyperlipidemia.  He had ablation in 2015 at [facility].  He had a repeat ablation 03/11/2020 with again ablation of 04/29/2020.  He had Lloyd frequent episodes of atrial fibrillation and is now on flecainide.  He was admitted to the hospital 08/12/2021 for an episode of sepsis.  He received IV fluids and antibiotics with improvement in his symptoms.  He was readmitted a few days later with volume overload requiring diuresis.  He had an MRI of the spine that showed L4-L5 spinal stenosis and has been getting steroids.  He states that he is now on insulin due to the steroids.  Today, denies symptoms of palpitations, chest pain, shortness of breath, orthopnea, PND, lower extremity edema, claudication, dizziness, presyncope, syncope, bleeding, or neurologic sequela. The patient is tolerating medications without difficulties.     Past Medical History:  Diagnosis Date   Acute tracheobronchitis 01/05/2019   B12 deficiency    Basal cell carcinoma (BCC) of left side of nose 01/28/2018   Cancer ([place])    Cardiomyopathy ([place]) 03/10/2018   With chronic atrial fibrillation   CHF (congestive heart failure) ([place]) 04/16/2013   Functional class II, ejection fraction 35-40%  Formatting of this note might be different from the original. Functional class II, ejection fraction 35-40%   Chronic anticoagulation    Chronic diastolic (congestive) heart  failure ([place]) 04/16/2013   Functional class II, ejection fraction 35-40%  Last Assessment & Plan:  Clinically stable Volume well controlled meds reviewed   Chronic diastolic CHF (congestive heart failure) ([place]) 04/16/2013   Functional class II, ejection fraction 35-40%  Last Assessment & Plan:  Clinically stable Volume well controlled meds reviewed   Colon polyps 04/16/2013   Diabetic neuropathy (HCC)    Eosinophil count raised 02/17/2019   Essential hypertension 01/06/2010   Last Assessment & Plan:  Well controlled Continue med management   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  Steroid usage was discontinued several months ago.  Follow up with Dermatology as planned.   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   Last Assessment & Plan:  Id following ?ongoing abx managementy reported Request records   Infection of prosthetic right knee joint ([place])    Insomnia 03/04/2012   Grief with loss of wife 02/20/12  Last Assessment & Plan:  Improved sx  contineu xanax prn Se discussed   Lesion of liver 04/20/2019   -On cardiac CT 02/2019   Mild reactive airways disease    Neoplasm of prostate 04/16/2013   Neoplasm of prostate, malignant ([place]) 01/06/2010   [name] S/p radiation therapy  Last Assessment & Plan:  Repeat psa today No urinary sx Pt reported fatigue/poor appetite   On amiodarone therapy 09/07/2013   On continuous oral anticoagulation 01/28/2018   Other activity(E029.9) 04/20/2013   Formatting of this note might be different from the original. Transthoracic Echocardiogram-03/10/2013-[facility]: Normal left  ventricular wall thickness and cavity size.  Global left ventricular systolic function is moderately reduced.  The estimated ejection fraction is 35-40%.  The left atrium is moderately enlarged.  The right atrium is mildly enlarged.  No significant valvular    Overweight (BMI 25.0-29.9) 10/22/2016   Persistent atrial fibrillation ([place]) 04/16/2013   Last Assessment & Plan:  Rate controlled Continue med management eliquis for stroke preventino  Formatting of this note might be different from the original.  Drug  HX Current Rx Pre-ABL inefficacy Pre-ABL intolerant Post-ABL inefficacy Post-ABL intolerant max dose/24h M/Y end comments  sotalol                  dofetilide                  flecainide                  propafenone                  am   S/P TKR (total knee replacement), bilateral    Secondary hypercoagulable state ([place]) 04/09/2019   Tinea cruris 04/16/2013   Type 2 diabetes mellitus with diabetic polyneuropathy, without long-term current use of insulin ([place]) 04/16/2013   Last Assessment & Plan:  Labs today Pt reports well controlled on ambulatory monitoring   Vitamin D deficiency 07/25/2018   Past Surgical History:  Procedure Laterality Date   ATRIAL FIBRILLATION ABLATION N/A 03/12/2019   Procedure: ATRIAL FIBRILLATION ABLATION;  Surgeon: [name], Charles Lloyd;  Location: [facility];  Service: Cardiovascular;  Laterality: N/A;   ATRIAL FIBRILLATION ABLATION N/A 04/29/2020   Procedure: ATRIAL FIBRILLATION ABLATION;  Surgeon: [name], Charles Lloyd;  Location: [facility];  Service: Cardiovascular;  Laterality: N/A;   CARDIAC ELECTROPHYSIOLOGY STUDY AND ABLATION     CARDIOVERSION     CARDIOVERSION N/A 10/10/2018   Procedure: CARDIOVERSION;  Surgeon: [name], Charles Lloyd;  Location: [facility];  Service: Cardiovascular;  Laterality: N/A;   CARDIOVERSION N/A 05/18/2020   Procedure: CARDIOVERSION;  Surgeon: [name], Charles Lloyd;  Location: [facility];  Service: Cardiovascular;   Laterality: N/A;   CARDIOVERSION N/A 12/09/2020   Procedure: CARDIOVERSION;  Surgeon: Lelon Perla, Charles Lloyd;  Location: [facility] ENDOSCOPY;  Service: Cardiovascular;  Laterality: N/A;   CHOLECYSTECTOMY     PROSTATECTOMY     REPLACEMENT TOTAL KNEE BILATERAL       Current Outpatient Medications  Medication Sig Dispense Refill   ACCU-CHEK AVIVA PLUS test strip 1 EACH BY OTHER ROUTE DAILY. DX E11.9 100 strip 12   albuterol (VENTOLIN HFA) 108 (90 Base) MCG/ACT inhaler Inhale 1-2 puffs into the lungs every 6 (six) hours as needed for wheezing or shortness of breath. 8 g 2   allopurinol (ZYLOPRIM) 300 MG tablet Take 1 tablet (300 mg total) by mouth daily. (Patient taking differently: Take 300 mg by mouth daily as needed (gout).) 90 tablet 1   atorvastatin (LIPITOR) 20 MG tablet TAKE 1 TABLET DAILY (Patient taking differently: Take 20 mg by mouth daily.) 90 tablet 1   carvedilol (COREG) 3.125 MG tablet Take 1 tablet (3.125 mg total) by mouth 2 (two) times daily  with a meal.     Continuous Blood Gluc Receiver (FREESTYLE LIBRE 2 READER) DEVI 1 each by Does not apply route daily. 1 each 2   Continuous Blood Gluc Sensor (FREESTYLE LIBRE 2 SENSOR) MISC 1 each by Does not apply route daily. 2 each 6   ELIQUIS 5 MG TABS tablet TAKE 1 TABLET TWICE A DAY 180 tablet 3   flecainide (TAMBOCOR) 100 MG tablet Take 1 tablet (100 mg total) by mouth 2 (two) times daily. 180 tablet 1   fluticasone (FLONASE) 50 MCG/ACT nasal spray Place 2 sprays into both nostrils daily. (Patient taking differently: Place 2 sprays into both nostrils daily as needed for allergies.) 16 g 2   fluticasone furoate-vilanterol (BREO ELLIPTA) 100-25 MCG/INH AEPB Inhale 1 puff into the lungs daily. (Patient taking differently: Inhale 1 puff into the lungs daily as needed (SOB).) 60 each 11   gabapentin (NEURONTIN) 300 MG capsule TAKE 2 CAPSULES BY MOUTH AT BEDTIME (Patient taking differently: Take 600 mg by mouth at bedtime.) 60 capsule 2   insulin  glargine (LANTUS SOLOSTAR) 100 UNIT/ML Solostar Pen Inject 8 Units into the skin daily. 15 mL 0   KLOR-CON M20 20 MEQ tablet Take 1 tablet (20 mEq total) by mouth daily.     montelukast (SINGULAIR) 10 MG tablet Take 10 mg by mouth at bedtime.      RESTASIS 0.05 % ophthalmic emulsion Place 1 drop into both eyes 2 (two) times daily as needed (dry eyes).     torsemide (DEMADEX) 20 MG tablet Take 1 tablet (20 mg total) by mouth daily.     triamcinolone cream (KENALOG) 0.1 % Apply 1 application. topically 2 (two) times daily. (Patient taking differently: Apply 1 application  topically daily as needed (itching).) 453 g 0   valsartan (DIOVAN) 160 MG tablet Take 1 tablet (160 mg total) by mouth daily. 90 tablet 1   No current facility-administered medications for this visit.    Allergies:   Patient has no known allergies.   Social History:  The patient  reports that he has never smoked. He has never used smokeless tobacco. He reports current alcohol use of about 14.0 standard drinks of alcohol per week. He reports that he does not use drugs.   Family History:  The patient's family history includes Cancer in his father and mother; Depression in his father and mother; Early death in his father and mother.   ROS:  Please see the history of present illness.   Otherwise, review of systems is positive for none.   All other systems are reviewed and negative.   PHYSICAL EXAM: VS:  BP (!) 166/80   Pulse 68   Ht '5\' 10"'$  (1.778 m)   Wt 173 lb (78.5 kg)   SpO2 99%   BMI 24.82 kg/m  , BMI Body mass index is 24.82 kg/m. GEN: Well nourished, well developed, in no acute distress  HEENT: normal  Neck: no JVD, carotid bruits, or masses Cardiac: RRR; no murmurs, rubs, or gallops,no edema  Respiratory:  clear to auscultation bilaterally, normal work of breathing GI: soft, nontender, nondistended, + BS MS: no deformity or atrophy  Skin: warm and dry Neuro:  Strength and sensation are intact Psych: euthymic  mood, full affect  EKG:  EKG is ordered today. Personal review of the ekg ordered shows sinus rhythm, rate 68   Recent Labs: 08/08/2021: Magnesium 2.0 08/11/2021: B Natriuretic Peptide 478.5 08/14/2021: ALT 28 08/27/2021: BUN 39; Creatinine, Ser 2.09; Hemoglobin 9.5; Platelets  240; Potassium 4.3; Sodium 132    Lipid Panel     Component Value Date/Time   CHOL 120 05/20/2020 0914   TRIG 73 05/20/2020 0914   HDL 46 05/20/2020 0914   CHOLHDL 2.6 05/20/2020 0914   CHOLHDL 3 07/24/2018 1058   VLDL 25.0 07/24/2018 1058   LDLCALC 59 05/20/2020 0914     Wt Readings from Last 3 Encounters:  10/02/21 173 lb (78.5 kg)  08/28/21 174 lb (78.9 kg)  08/27/21 185 lb 10 oz (84.2 kg)      Other studies Reviewed: Additional studies/ records that were reviewed today include: TTE 08/12/2021 Review of the above records today demonstrates:   1. Left ventricular ejection fraction, by estimation, is 50%. The left  ventricle has low normal function. The left ventricle has no regional wall  motion abnormalities. Left ventricular diastolic parameters are  indeterminate.   2. Right ventricular systolic function is hyperdynamic. The right  ventricular size is normal. There is normal pulmonary artery systolic  pressure.   3. The mitral valve is normal in structure. Mild mitral valve  regurgitation. No evidence of mitral stenosis.   4. The aortic valve is tricuspid. Aortic valve regurgitation is not  visualized. Aortic valve sclerosis is present, with no evidence of aortic  valve stenosis.   Myoview 05/25/19 Nuclear stress EF: 60%. No T wave inversion was noted during stress. There was no ST segment deviation noted during stress. This is a low risk study.   Normal perfusion. LVEF 60% with normal wall motion. This is a low risk study. No prior for comparison.  ASSESSMENT AND PLAN:  1.  Persistent atrial fibrillation: Currently on Eliquis 5 mg twice daily.  Status post ablation 03/02/2019 with repeat  ablation 04/29/2020.  CHA2DS2-VASc of 4.  Currently on flecainide 100 mg twice daily.  High risk medication monitoring for flecainide via ECG today.  He remains in sinus rhythm.  No changes.  2.  Chronic systolic heart failure: Ejection fraction is improved with maintenance of sinus rhythm.  No changes.  3.  Hypertension: Blood pressure is elevated today.  He has had normal blood pressures on prior checks, and it is averaging less than 130/80 at home.  No changes.  4.  Hyperlipidemia: Continue statin per primary cardiology  5.  Secondary hypercoagulable state: Currently on Eliquis for atrial fibrillation as above    Current medicines are reviewed at length with the patient today.   The patient does not have concerns regarding his medicines.  The following changes were made today: None  Labs/ tests ordered today include:  Orders Placed This Encounter  Procedures   EKG 12-Lead      Disposition:   FU with Micheale Schlack 6 months  Signed, Georga Stys Meredith Leeds, Charles Lloyd  10/02/2021 2:52 PM     [place] [address] [place] [address] [phone] (office) [phone] (fax)

## 2021-10-02 NOTE — Patient Instructions (Signed)
Medication Instructions:  Your physician recommends that you continue on your current medications as directed. Please refer to the Current Medication list given to you today.  *If you need a refill on your cardiac medications before your next appointment, please call your pharmacy*   Lab Work: None ordered.  If you have labs (blood work) drawn today and your tests are completely normal, you will receive your results only by: [place] (if you have MyChart) OR A paper copy in the mail If you have any lab test that is abnormal or we need to change your treatment, we will call you to review the results.   Testing/Procedures: None ordered.    Follow-Up: At [facility], you and your health needs are our priority.  As part of our continuing mission to provide you with exceptional heart care, we have created designated Provider Care Teams.  These Care Teams include your primary Cardiologist (physician) and Advanced Practice Providers (APPs -  Physician Assistants and Nurse Practitioners) who all work together to provide you with the care you need, when you need it.  We recommend signing up for the patient portal called "MyChart".  Sign up information is provided on this After Visit Summary.  MyChart is used to connect with patients for Virtual Visits (Telemedicine).  Patients are able to view lab/test results, encounter notes, upcoming appointments, etc.  Non-urgent messages can be sent to your provider as well.   To learn more about what you can do with MyChart, go to [email or web address].    Your next appointment:   6 months with Dr Curt Bears  Important Information About Sugar

## 2021-10-03 ENCOUNTER — Telehealth: Payer: Self-pay | Admitting: Cardiology

## 2021-10-03 NOTE — Telephone Encounter (Signed)
Medication updated, Left detailed message.

## 2021-10-03 NOTE — Telephone Encounter (Signed)
Pt c/o medication issue:  1. Name of Medication: carvedilol 12.5 mg  2. How are you currently taking this medication (dosage and times per day)? Twice a day   3. Are you having a reaction (difficulty breathing--STAT)? No   4. What is your medication issue? Patient was advised to call in to report how he takes medication so his chart can be updated. Please advise.

## 2021-10-12 NOTE — Progress Notes (Deleted)
Office Visit Note  Patient: Charles Lloyd.             Date of Birth: Apr 20, 1940           MRN: [id number]             PCP: Isaac Bliss, [name], MD Referring: Isaac Bliss, [place] [facility]* Visit Date: 10/18/2021   Subjective:  No chief complaint on file.   History of Present Illness: Charles Rohr. is a 81 y.o. male here for follow up pain in both wrist joints and polyarthralgias   Previous HPI 01/25/2020 Charles Lloyd. is a 81 y.o. male with a history of Afib, congestive heart failure, type 2 diabetes with peripheral neuropathy, basal cell carcinoma, and prostate cancer here for evaluation of diffuse body pain with concern for possible PMR He experienced diffuse severe body pain that started abruptly around September after he returned from a trip to the [place]. He had workup including ehrlichia, RMSF, RF, CCP, ANA that were unrevealing. He took a 4 week course of antibiotic for a few positive B. Borgdorferi bands on WB without clinical improvement. He was prescribed a course of 77m prednisone in October and he states that within 1 day of starting this he felt 90% or better symptom improvement. He has not taken any since about 2 weeks ago with no return of symptoms so far. Today he is feeling well with none of the previous symptoms complaints his only active complaint is his lower extremity numbness that is chronic.   Labs reviewed 11/2019 Vitamin D 30 CK 39 CRP 3.5 ESR 2 CBC Hgb 12.0   10/2019 ANA negative RF negative CCP negative   No Rheumatology ROS completed.   PMFS History:  Patient Active Problem List   Diagnosis Date Noted   Acute exacerbation of CHF (congestive heart failure) ([facility] 08/11/2021   Diarrhea 08/08/2021   FUO (fever of unknown origin) 08/06/2021   Hypokalemia 08/06/2021   Hypomagnesemia 08/06/2021   Hyperglycemia 08/05/2021   Pancytopenia ([facility] 08/05/2021   Fall at home, initial encounter 08/05/2021   Paresthesia of lower  extremity 08/05/2021   Lower extremity weakness 08/05/2021   SIRS (systemic inflammatory response syndrome) ([facility] 08/04/2021   Thrombocytopenia ([facility] 08/04/2021   Chronic kidney disease, stage 3b ([facility] 12/30/2020   Multiple falls    Spinal stenosis    S/P TKR (total knee replacement), bilateral    Infection of prosthetic right knee joint ([facility]    Cancer ([place]    Arthralgia 11/20/2019   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   Lesion of liver 04/20/2019   Secondary hypercoagulable state ([facility] 04/09/2019   Eosinophil count raised 02/17/2019   Acute tracheobronchitis 01/05/2019   Asthma    Hyponatremia 12/17/2018   Vitamin D deficiency 07/25/2018   Cardiomyopathy ([facility] 03/10/2018   Chronic anticoagulation 03/06/2018   Hyperlipidemia associated with type 2 diabetes mellitus ([facility] 01/28/2018   Basal cell carcinoma (BCC) of left side of nose 01/28/2018   On continuous oral anticoagulation 01/28/2018   Infected prosthetic knee joint ([facility] 06/24/2017   B12 deficiency 10/22/2016   Overweight (BMI 25.0-29.9) 10/22/2016   Primary osteoarthritis of right knee 03/29/2015   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   On amiodarone therapy 09/07/2013   Other activity(E029.9) 04/20/2013   Paroxysmal A-fib (HCC) 04/16/2013   Chronic diastolic (congestive) heart failure ([facility] 04/16/2013   CHF (congestive heart failure) ([facility] 04/16/2013   Colon polyps 04/16/2013   NIDDM-2 with polyneuropathy and hyperglycemia 04/16/2013   Tinea cruris 04/16/2013  Neoplasm of prostate 04/16/2013   Persistent atrial fibrillation ([place]) 04/16/2013   Chronic diastolic CHF (congestive heart failure) ([place]) 04/16/2013   Diabetic neuropathy ([place]) 07/14/2012   Insomnia 03/04/2012   Hypertensive heart disease with heart failure ([place]) 01/06/2010   Neoplasm of prostate, malignant ([place]) 01/06/2010   Hypercholesterolemia 01/06/2010   Essential hypertension 01/06/2010    Past Medical History:  Diagnosis Date   Acute tracheobronchitis  01/05/2019   B12 deficiency    Basal cell carcinoma (BCC) of left side of nose 01/28/2018   Cancer ([place])    Cardiomyopathy ([place]) 03/10/2018   With chronic atrial fibrillation   CHF (congestive heart failure) ([place]) 04/16/2013   Functional class II, ejection fraction 35-40%  Formatting of this note might be different from the original. Functional class II, ejection fraction 35-40%   Chronic anticoagulation    Chronic diastolic (congestive) heart failure ([place]) 04/16/2013   Functional class II, ejection fraction 35-40%  Last Assessment & Plan:  Clinically stable Volume well controlled meds reviewed   Chronic diastolic CHF (congestive heart failure) ([place]) 04/16/2013   Functional class II, ejection fraction 35-40%  Last Assessment & Plan:  Clinically stable Volume well controlled meds reviewed   Colon polyps 04/16/2013   Diabetic neuropathy (HCC)    Eosinophil count raised 02/17/2019   Essential hypertension 01/06/2010   Last Assessment & Plan:  Well controlled Continue med management   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  Steroid usage was discontinued several months ago.  Follow up with Dermatology as planned.   Hypercholesterolemia 01/06/2010   Last Assessment & Plan:  Repeat labs recommended   Hyperlipidemia associated with type 2 diabetes mellitus ([place]) 01/28/2018   Hypertensive heart disease with heart failure ([place]) 01/06/2010   Last Assessment & Plan:  Well controlled Continue med management   Hyponatremia 12/17/2018   Infected prosthetic knee joint ([place]) 06/24/2017   Last Assessment & Plan:  Id following ?ongoing abx managementy reported Request records   Infection of prosthetic right knee joint ([place])    Insomnia 03/04/2012   Grief with loss of wife 02/20/12  Last Assessment & Plan:  Improved sx  contineu xanax prn Se discussed   Lesion of liver 04/20/2019   -On cardiac CT 02/2019   Mild reactive airways disease    Neoplasm of  prostate 04/16/2013   Neoplasm of prostate, malignant ([place]) 01/06/2010   [name] S/p radiation therapy   Last Assessment & Plan:  Repeat psa today No urinary sx Pt reported fatigue/poor appetite   On amiodarone therapy 09/07/2013   On continuous oral anticoagulation 01/28/2018   Other activity(E029.9) 04/20/2013   Formatting of this note might be different from the original. Transthoracic Echocardiogram-03[facility]10/[facility]: Normal left ventricular wall thickness and cavity size.  Global left ventricular systolic function is moderately reduced.  The estimated ejection fraction is 35-40%.  The left atrium is moderately enlarged.  The right atrium is mildly enlarged.  No significant valvular    Overweight (BMI 25.0-29.9) 10/22/2016   Persistent atrial fibrillation ([place]) 04/16/2013   Last Assessment & Plan:  Rate controlled Continue med management eliquis for stroke preventino  Formatting of this note might be different from the original.  Drug  HX Current Rx Pre-ABL inefficacy Pre-ABL intolerant Post-ABL inefficacy Post-ABL intolerant max dose/24h M/Y end comments  sotalol                  dofetilide  flecainide                  propafenone                  am   S/P TKR (total knee replacement), bilateral    Secondary hypercoagulable state ([place]) 04/09/2019   Tinea cruris 04/16/2013   Type 2 diabetes mellitus with diabetic polyneuropathy, without long-term current use of insulin ([place]) 04/16/2013   Last Assessment & Plan:  Labs today Pt reports well controlled on ambulatory monitoring   Vitamin D deficiency 07/25/2018    Family History  Problem Relation Age of Onset   Cancer Mother    Depression Mother    Early death Mother    Cancer Father    Depression Father    Early death Father    Past Surgical History:  Procedure Laterality Date   ATRIAL FIBRILLATION ABLATION N/A 03/12/2019   Procedure: ATRIAL FIBRILLATION ABLATION;  Surgeon: [name], MD;  Location:  [facility];  Service: Cardiovascular;  Laterality: N/A;   ATRIAL FIBRILLATION ABLATION N/A 04/29/2020   Procedure: ATRIAL FIBRILLATION ABLATION;  Surgeon: [name], MD;  Location: [facility];  Service: Cardiovascular;  Laterality: N/A;   CARDIAC ELECTROPHYSIOLOGY STUDY AND ABLATION     CARDIOVERSION     CARDIOVERSION N/A 10/10/2018   Procedure: CARDIOVERSION;  Surgeon: [name], MD;  Location: [facility] ENDOSCOPY;  Service: Cardiovascular;  Laterality: N/A;   CARDIOVERSION N/A 05/18/2020   Procedure: CARDIOVERSION;  Surgeon: [name], MD;  Location: [facility] ENDOSCOPY;  Service: Cardiovascular;  Laterality: N/A;   CARDIOVERSION N/A 12/09/2020   Procedure: CARDIOVERSION;  Surgeon: Lelon Perla, MD;  Location: [facility] ENDOSCOPY;  Service: Cardiovascular;  Laterality: N/A;   CHOLECYSTECTOMY     PROSTATECTOMY     REPLACEMENT TOTAL KNEE BILATERAL     Social History   Social History Narrative   Not on file   Immunization History  Administered Date(s) Administered   Fluad Quad(high Dose 65+) 11/25/2018, 03/25/2020, 11/23/2020   Influenza, High Dose Seasonal PF 11/21/2010, 01/19/2013, 02/01/2014, 02/07/2015, 12/28/2015, 10/22/2016, 01/28/2018   Influenza,trivalent, recombinat, inj, PF 01/11/2012   Influenza-Unspecified 12/09/2009   PFIZER(Purple Top)SARS-COV-2 Vaccination 04/19/2019, 05/13/2019, 11/21/2019   Pneumococcal Conjugate-13 01/19/2013   Pneumococcal Polysaccharide-23 12/01/2008   Tdap 10/28/2006, 12/30/2020   Zoster Recombinat (Shingrix) 01/28/2018, 07/24/2018   Zoster, Live 10/28/2006     Objective: Vital Signs: There were no vitals taken for this visit.   Physical Exam   Musculoskeletal Exam: ***  CDAI Exam: CDAI Score: -- Patient Global: --; Provider Global: -- Swollen: --; Tender: -- Joint Exam 10/18/2021   No joint exam has been documented for this visit   There is currently no information documented on the homunculus. Go to the  Rheumatology activity and complete the homunculus joint exam.  Investigation: No additional findings.  Imaging: No results found.  Recent Labs: Lab Results  Component Value Date   WBC 7.7 08/27/2021   HGB 9.5 (L) 08/27/2021   PLT 240 08/27/2021   NA 132 (L) 08/27/2021   K 4.3 08/27/2021   CL 100 08/27/2021   CO2 22 08/27/2021   GLUCOSE 149 (H) 08/27/2021   BUN 39 (H) 08/27/2021   CREATININE 2.09 (H) 08/27/2021   BILITOT 1.1 08/14/2021   ALKPHOS 66 08/14/2021   AST 25 08/14/2021   ALT 28 08/14/2021   PROT 5.3 (L) 08/14/2021   ALBUMIN 2.5 (L) 08/14/2021   CALCIUM 9.1 08/27/2021  GFRAA 73 04/18/2020    Speciality Comments: No specialty comments available.  Procedures:  No procedures performed Allergies: Patient has no known allergies.   Assessment / Plan:     Visit Diagnoses: No diagnosis found.  ***  Orders: No orders of the defined types were placed in this encounter.  No orders of the defined types were placed in this encounter.    Follow-Up Instructions: No follow-ups on file.   Bertram Savin, RT  Note - This record has been created using Editor, commissioning.  Chart creation errors have been sought, but may not always  have been located. Such creation errors do not reflect on  the standard of medical care.

## 2021-10-16 NOTE — Progress Notes (Signed)
Office Visit Note  Patient: Charles Lloyd.             Date of Birth: [birth year]           MRN: [id number]             PCP: Isaac Bliss, [name], MD Referring: Isaac Bliss, Estel* Visit Date: 10/17/2021   Subjective:  Follow-up (Sometimes get gout flare-ups and patient wants to know if there is a medication that could help.)   History of Present Illness: Charles Lloyd. is a 81 y.o. male here for follow up due to repeated gout flares. He developed severe swelling and pain in distal finger joints of his right hand since at least around April of this year. Labs at that time showed uric acid of 12.6. He also had prolonged hospital course in June with repeat admission with cytopenias and with heart failure exacerbation. This improved with colchicine treatment and also had incision and drainage. Currently off any colchicine or prednisone and symptoms are back to baseline. He remains on allopurinol 300 mg daily. He recently had diuretic medication decreased with a drop in eGFR.  Previous HPI 01/25/2020 [name] is a 81 y.o. male with a history of Afib, congestive heart failure, type 2 diabetes with peripheral neuropathy, basal cell carcinoma, and prostate cancer here for evaluation of diffuse body pain with concern for possible PMR. He experienced diffuse severe body pain that started abruptly around September after he returned from a trip to the [place]. He had workup including ehrlichia, RMSF, RF, CCP, ANA that were unrevealing. He took a 4 week course of antibiotic for a few positive B. Borgdorferi bands on WB without clinical improvement. He was prescribed a course of 24m prednisone in October and he states that within 1 day of starting this he felt 90% or better symptom improvement. He has not taken any since about 2 weeks ago with no return of symptoms so far. Today he is feeling well with none of the previous symptoms complaints his only active complaint is his  lower extremity numbness that is chronic.   Labs reviewed 11/2019 Vitamin D 30 CK 39 CRP 3.5 ESR 2 CBC Hgb 12.0   10/2019 ANA negative RF negative CCP negative     Activities of Daily Living:  Patient reports morning stiffness for 0 minutes.   Patient Denies nocturnal pain.  Difficulty dressing/grooming: Denies Difficulty climbing stairs: Denies Difficulty getting out of chair: Denies Difficulty using hands for taps, buttons, cutlery, and/or writing: Denies  Review of Systems  Constitutional:  Negative for fatigue.  HENT:  Positive for mouth dryness. Negative for mouth sores.   Eyes:  Positive for dryness.  Respiratory:  Negative for shortness of breath.   Cardiovascular:  Negative for chest pain and palpitations.  Gastrointestinal:  Negative for blood in stool, constipation and diarrhea.  Endocrine: Negative for increased urination.  Genitourinary:  Negative for involuntary urination.  Musculoskeletal:  Positive for gait problem. Negative for joint pain, joint pain, joint swelling, myalgias, muscle weakness, morning stiffness, muscle tenderness and myalgias.  Skin:  Negative for color change, rash, hair loss and sensitivity to sunlight.  Allergic/Immunologic: Negative for susceptible to infections.  Neurological:  Negative for dizziness and headaches.  Hematological:  Negative for swollen glands.  Psychiatric/Behavioral:  Negative for depressed mood and sleep disturbance. The patient is not nervous/anxious.     PMFS History:  Patient Active Problem List   Diagnosis Date Noted  Gout due to renal impairment 10/17/2021   Acute exacerbation of CHF (congestive heart failure) ([place]) 08/11/2021   Diarrhea 08/08/2021   FUO (fever of unknown origin) 08/06/2021   Hypokalemia 08/06/2021   Hypomagnesemia 08/06/2021   Hyperglycemia 08/05/2021   Pancytopenia ([place]) 08/05/2021   Fall at home, initial encounter 08/05/2021   Paresthesia of lower extremity 08/05/2021   Lower  extremity weakness 08/05/2021   SIRS (systemic inflammatory response syndrome) ([place]) 08/04/2021   Thrombocytopenia ([place]) 08/04/2021   Chronic kidney disease, stage 3b ([place]) 12/30/2020   Multiple falls    Spinal stenosis    S/P TKR (total knee replacement), bilateral    Infection of prosthetic right knee joint ([place])    Cancer ([place])    Arthralgia 11/20/2019   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   Lesion of liver 04/20/2019   Secondary hypercoagulable state ([place]) 04/09/2019   Eosinophil count raised 02/17/2019   Acute tracheobronchitis 01/05/2019   Asthma    Hyponatremia 12/17/2018   Vitamin D deficiency 07/25/2018   Cardiomyopathy ([place]) 03/10/2018   Chronic anticoagulation 03/06/2018   Hyperlipidemia associated with type 2 diabetes mellitus ([place]) 01/28/2018   Basal cell carcinoma (BCC) of left side of nose 01/28/2018   On continuous oral anticoagulation 01/28/2018   Infected prosthetic knee joint ([place]) 06/24/2017   B12 deficiency 10/22/2016   Overweight (BMI 25.0-29.9) 10/22/2016   Primary osteoarthritis of right knee 03/29/2015   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   On amiodarone therapy 09/07/2013   Other activity(E029.9) 04/20/2013   Paroxysmal A-fib (HCC) 04/16/2013   Chronic diastolic (congestive) heart failure ([place]) 04/16/2013   CHF (congestive heart failure) ([place]) 04/16/2013   Colon polyps 04/16/2013   NIDDM-2 with polyneuropathy and hyperglycemia 04/16/2013   Tinea cruris 04/16/2013   Neoplasm of prostate 04/16/2013   Persistent atrial fibrillation ([place]) 04/16/2013   Chronic diastolic CHF (congestive heart failure) ([place]) 04/16/2013   Diabetic neuropathy ([place]) 07/14/2012   Insomnia 03/04/2012   Hypertensive heart disease with heart failure ([place]) 01/06/2010   Neoplasm of prostate, malignant ([place]) 01/06/2010   Hypercholesterolemia 01/06/2010   Essential hypertension 01/06/2010    Past Medical History:  Diagnosis Date   Acute tracheobronchitis 01/05/2019   B12 deficiency     Basal cell carcinoma (BCC) of left side of nose 01/28/2018   Cancer ([place])    Cardiomyopathy ([place]) 03/10/2018   With chronic atrial fibrillation   CHF (congestive heart failure) ([place]) 04/16/2013   Functional class II, ejection fraction 35-40%  Formatting of this note might be different from the original. Functional class II, ejection fraction 35-40%   Chronic anticoagulation    Chronic diastolic (congestive) heart failure ([place]) 04/16/2013   Functional class II, ejection fraction 35-40%  Last Assessment & Plan:  Clinically stable Volume well controlled meds reviewed   Chronic diastolic CHF (congestive heart failure) ([place]) 04/16/2013   Functional class II, ejection fraction 35-40%  Last Assessment & Plan:  Clinically stable Volume well controlled meds reviewed   Colon polyps 04/16/2013   Diabetic neuropathy (HCC)    Eosinophil count raised 02/17/2019   Essential hypertension 01/06/2010   Last Assessment & Plan:  Well controlled Continue med management   Gout    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  Steroid usage was discontinued several months ago.  Follow up with Dermatology as planned.   Hypercholesterolemia 01/06/2010   Last Assessment & Plan:  Repeat labs recommended   Hyperlipidemia associated with type 2 diabetes mellitus ([place]) 01/28/2018  Hypertensive heart disease with heart failure ([place]) 01/06/2010   Last Assessment & Plan:  Well controlled Continue med management   Hyponatremia 12/17/2018   Infected prosthetic knee joint ([place]) 06/24/2017   Last Assessment & Plan:  Id following ?ongoing abx managementy reported Request records   Infection of prosthetic right knee joint ([place])    Insomnia 03/04/2012   Grief with loss of wife 02/20/12  Last Assessment & Plan:  Improved sx  contineu xanax prn Se discussed   Lesion of liver 04/20/2019   -On cardiac CT 02/2019   Mild reactive airways disease    Neoplasm of prostate  04/16/2013   Neoplasm of prostate, malignant ([place]) 01/06/2010   [name] S/p radiation therapy   Last Assessment & Plan:  Repeat psa today No urinary sx Pt reported fatigue/poor appetite   On amiodarone therapy 09/07/2013   On continuous oral anticoagulation 01/28/2018   Other activity(E029.9) 04/20/2013   Formatting of this note might be different from the original. Transthoracic Echocardiogram-03/10/2013-[facility]: Normal left ventricular wall thickness and cavity size.  Global left ventricular systolic function is moderately reduced.  The estimated ejection fraction is 35-40%.  The left atrium is moderately enlarged.  The right atrium is mildly enlarged.  No significant valvular    Overweight (BMI 25.0-29.9) 10/22/2016   Persistent atrial fibrillation ([place]) 04/16/2013   Last Assessment & Plan:  Rate controlled Continue med management eliquis for stroke preventino  Formatting of this note might be different from the original.  Drug  HX Current Rx Pre-ABL inefficacy Pre-ABL intolerant Post-ABL inefficacy Post-ABL intolerant max dose/24h M/Y end comments  sotalol                  dofetilide                  flecainide                  propafenone                  am   S/P TKR (total knee replacement), bilateral    Secondary hypercoagulable state ([place]) 04/09/2019   Tinea cruris 04/16/2013   Type 2 diabetes mellitus with diabetic polyneuropathy, without long-term current use of insulin ([place]) 04/16/2013   Last Assessment & Plan:  Labs today Pt reports well controlled on ambulatory monitoring   Vitamin D deficiency 07/25/2018    Family History  Problem Relation Age of Onset   Cancer Mother    Depression Mother    Early death Mother    Cancer Father    Depression Father    Early death Father    Past Surgical History:  Procedure Laterality Date   ATRIAL FIBRILLATION ABLATION N/A 03/12/2019   Procedure: ATRIAL FIBRILLATION ABLATION;  Surgeon: [name], MD;  Location:  [facility];  Service: Cardiovascular;  Laterality: N/A;   ATRIAL FIBRILLATION ABLATION N/A 04/29/2020   Procedure: ATRIAL FIBRILLATION ABLATION;  Surgeon: [name], MD;  Location: [facility];  Service: Cardiovascular;  Laterality: N/A;   CARDIAC ELECTROPHYSIOLOGY STUDY AND ABLATION     CARDIOVERSION     CARDIOVERSION N/A 10/10/2018   Procedure: CARDIOVERSION;  Surgeon: [name], MD;  Location: [facility];  Service: Cardiovascular;  Laterality: N/A;   CARDIOVERSION N/A 05/18/2020   Procedure: CARDIOVERSION;  Surgeon: [name], MD;  Location: [facility] ENDOSCOPY;  Service: Cardiovascular;  Laterality: N/A;   CARDIOVERSION N/A 12/09/2020   Procedure: CARDIOVERSION;  Surgeon: Lelon Perla, MD;  Location: [facility] ENDOSCOPY;  Service: Cardiovascular;  Laterality: N/A;   CHOLECYSTECTOMY     PROSTATECTOMY     REPLACEMENT TOTAL KNEE BILATERAL     Social History   Social History Narrative   Not on file   Immunization History  Administered Date(s) Administered   Fluad Quad(high Dose 65+) 11/25/2018, 03/25/2020, 11/23/2020   Influenza, High Dose Seasonal PF 11/21/2010, 01/19/2013, 02/01/2014, 02/07/2015, 12/28/2015, 10/22/2016, 01/28/2018   Influenza,trivalent, recombinat, inj, PF 01/11/2012   Influenza-Unspecified 12/09/2009   PFIZER(Purple Top)SARS-COV-2 Vaccination 04/19/2019, 05/13/2019, 11/21/2019   Pneumococcal Conjugate-13 01/19/2013   Pneumococcal Polysaccharide-23 12/01/2008   Tdap 10/28/2006, 12/30/2020   Zoster Recombinat (Shingrix) 01/28/2018, 07/24/2018   Zoster, Live 10/28/2006     Objective: Vital Signs: BP 137/73 (BP Location: Right Arm, Patient Position: Sitting, Cuff Size: Normal)   Pulse 70   Resp 15   Ht 5' 10"  (1.778 m)   Wt 174 lb (78.9 kg)   BMI 24.97 kg/m    Physical Exam Cardiovascular:     Rate and Rhythm: Normal rate and regular rhythm.  Pulmonary:     Effort: Pulmonary effort is normal.     Breath sounds: Normal breath  sounds.  Musculoskeletal:     Right lower leg: Edema present.     Left lower leg: Edema present.     Comments: 1+ pitting edema at the ankles extending less than halfway up to the  Skin:    General: Skin is warm and dry.  Neurological:     Mental Status: He is alert.  Psychiatric:        Mood and Affect: Mood normal.      Musculoskeletal Exam:  Elbows full ROM no tenderness or swelling Wrists full ROM no tenderness or swelling Fingers full ROM, squaring of first CMC joint, there are Heberden's nodes mild overlying erythema on nodules at the right second through fourth DIPs but no palpable swelling and no tenderness Knees full ROM no tenderness or swelling   Investigation: No additional findings.  Imaging: No results found.  Recent Labs: Lab Results  Component Value Date   WBC 7.7 08/27/2021   HGB 9.5 (L) 08/27/2021   PLT 240 08/27/2021   NA 132 (L) 08/27/2021   K 4.3 08/27/2021   CL 100 08/27/2021   CO2 22 08/27/2021   GLUCOSE 149 (H) 08/27/2021   BUN 39 (H) 08/27/2021   CREATININE 2.09 (H) 08/27/2021   BILITOT 1.1 08/14/2021   ALKPHOS 66 08/14/2021   AST 25 08/14/2021   ALT 28 08/14/2021   PROT 5.3 (L) 08/14/2021   ALBUMIN 2.5 (L) 08/14/2021   CALCIUM 9.1 08/27/2021   GFRAA 73 04/18/2020    Speciality Comments: No specialty comments available.  Procedures:  No procedures performed Allergies: Patient has no known allergies.   Assessment / Plan:     Visit Diagnoses: Chronic gout due to renal impairment of multiple sites without tophus - Plan: Sedimentation rate, Uric acid, BASIC METABOLIC PANEL WITH GFR  Inflammation appears to be well controlled I think his multiple episodes this year were provoked by other medical issue with hospitalization and acute on chronic kidney injury.  Rechecking sedimentation rate to see that this normalized now outside of a flare.  Recheck uric acid level on the allopurinol 300 mg daily.  Also rechecking basic metabolic panel to  see if kidney function is improving or any medication intolerance concerns.  If his uric acid is at goal below 6 would not recommend additional  titration of allopurinol.  Pain of both wrist joints Polyarthralgia  Current joint pains are not as severe but appear consistent with some generalized osteoarthritis.  There is definite chronic joint changes at the first [facility] joint and wrists on reviewed x-ray images.  Orders: Orders Placed This Encounter  Procedures   Sedimentation rate   Uric acid   BASIC METABOLIC PANEL WITH GFR   No orders of the defined types were placed in this encounter.    Follow-Up Instructions: No follow-ups on file.   Collier Salina, MD  Note - This record has been created using [employer].  Chart creation errors have been sought, but may not always  have been located. Such creation errors do not reflect on  the standard of medical care.

## 2021-10-17 ENCOUNTER — Ambulatory Visit: Payer: Medicare Other | Attending: Internal Medicine | Admitting: Internal Medicine

## 2021-10-17 ENCOUNTER — Encounter: Payer: Self-pay | Admitting: Internal Medicine

## 2021-10-17 VITALS — BP 137/73 | HR 70 | Resp 15 | Ht 70.0 in | Wt 174.0 lb

## 2021-10-17 DIAGNOSIS — M25531 Pain in right wrist: Secondary | ICD-10-CM | POA: Diagnosis not present

## 2021-10-17 DIAGNOSIS — M25532 Pain in left wrist: Secondary | ICD-10-CM

## 2021-10-17 DIAGNOSIS — M2550 Pain in unspecified joint: Secondary | ICD-10-CM | POA: Diagnosis not present

## 2021-10-17 DIAGNOSIS — M1030 Gout due to renal impairment, unspecified site: Secondary | ICD-10-CM | POA: Insufficient documentation

## 2021-10-17 DIAGNOSIS — M1A39X0 Chronic gout due to renal impairment, multiple sites, without tophus (tophi): Secondary | ICD-10-CM | POA: Diagnosis not present

## 2021-10-18 ENCOUNTER — Telehealth: Payer: Self-pay | Admitting: Internal Medicine

## 2021-10-18 ENCOUNTER — Ambulatory Visit: Payer: Medicare Other | Admitting: Internal Medicine

## 2021-10-18 DIAGNOSIS — M25531 Pain in right wrist: Secondary | ICD-10-CM

## 2021-10-18 LAB — URIC ACID: Uric Acid, Serum: 5 mg/dL (ref 4.0–8.0)

## 2021-10-18 LAB — BASIC METABOLIC PANEL WITH GFR
BUN/Creatinine Ratio: 38 (calc) — ABNORMAL HIGH (ref 6–22)
BUN: 66 mg/dL — ABNORMAL HIGH (ref 7–25)
CO2: 23 mmol/L (ref 20–32)
Calcium: 8.6 mg/dL (ref 8.6–10.3)
Chloride: 109 mmol/L (ref 98–110)
Creat: 1.72 mg/dL — ABNORMAL HIGH (ref 0.70–1.28)
Glucose, Bld: 146 mg/dL — ABNORMAL HIGH (ref 65–99)
Potassium: 4 mmol/L (ref 3.5–5.3)
Sodium: 142 mmol/L (ref 135–146)
eGFR: 40 mL/min/{1.73_m2} — ABNORMAL LOW (ref >=60)

## 2021-10-18 LAB — SEDIMENTATION RATE: Sed Rate: 2 mm/h (ref 0–20)

## 2021-10-18 NOTE — Progress Notes (Signed)
His uric acid currently looks good at 5.0, his goal is under 6. He can continue the current allopurinol 300 mg daily. I don't think he needs to take colchicine unless having a flare. If we start to see more flare ups despite staying on this he should contact us we might need to switch medication. We can schedule a follow up in about 6 months to monitor this or he can call us sooner as needed.

## 2021-10-18 NOTE — Telephone Encounter (Signed)
Patient left a voicemail stating he has missed a few calls regarding labs and requests a call back.

## 2021-11-01 ENCOUNTER — Telehealth: Payer: Self-pay | Admitting: Cardiology

## 2021-11-01 NOTE — Telephone Encounter (Signed)
Called patient and he reported that his legs and feet started swelling. Pt states his medication, torsemide (DEMADEX) 20 MG tablet, was reduced by half and it's been fine for him for several months, but the last couple of weeks his feet and ankles have swelled significantly. Patient has no shortness of breath or any other symptoms.

## 2021-11-01 NOTE — Telephone Encounter (Signed)
Pt c/o swelling: STAT is pt has developed SOB within 24 hours  How much weight have you gained and in what time span? No  If swelling, where is the swelling located? Legs and feet  Are you currently taking a fluid pill? Yes  Are you currently SOB? No  Do you have a log of your daily weights (if so, list)?   Have you gained 3 pounds in a day or 5 pounds in a week? No  Have you traveled recently? No    Pt states his medication, torsemide (DEMADEX) 20 MG tablet, was taken down by half and it's been find for him for several months, but the last couple of weeks his feet and ankles have swelled significantly.

## 2021-11-02 ENCOUNTER — Other Ambulatory Visit: Payer: Self-pay

## 2021-11-02 MED ORDER — TORSEMIDE 20 MG PO TABS: 20.0000 mg | ORAL_TABLET | Freq: Every day | ORAL | 3 refills | Status: DC

## 2021-11-02 NOTE — Telephone Encounter (Signed)
Called patient and informed him of Dr. Joya Gaskins recommendation to start Torsemide 20 mg daily. Patient was agreeable with this plan and had no further questions at this time.

## 2021-11-03 ENCOUNTER — Other Ambulatory Visit: Payer: Self-pay | Admitting: Cardiology

## 2021-11-13 ENCOUNTER — Telehealth: Payer: Self-pay | Admitting: Cardiology

## 2021-11-13 ENCOUNTER — Other Ambulatory Visit: Payer: Self-pay

## 2021-11-13 MED ORDER — TORSEMIDE 20 MG PO TABS: 20.0000 mg | ORAL_TABLET | Freq: Two times a day (BID) | ORAL | 3 refills | Status: DC

## 2021-11-13 NOTE — Telephone Encounter (Signed)
  Pt c/o medication issue:  1. Name of Medication: torsemide (DEMADEX) 20 MG tablet  2. How are you currently taking this medication (dosage and times per day)? Take 1 tablet (20 mg total) by mouth daily.  3. Are you having a reaction (difficulty breathing--STAT)?  No   4. What is your medication issue? Pt would like to speak with a nurse regarding this medication

## 2021-11-13 NOTE — Telephone Encounter (Signed)
Called patient and he reported that the swelling in his ankles has not diminished after switching to a whole pill of Torsemide. I reported this to Dr. Bettina Gavia and he recommended increasing his Torsemide to twice daily and to ask the patient to start weighing himself daily. I relayed this to the patient and he agreed with this plan and had no further questions.

## 2021-11-14 ENCOUNTER — Other Ambulatory Visit (HOSPITAL_BASED_OUTPATIENT_CLINIC_OR_DEPARTMENT_OTHER): Payer: Self-pay

## 2021-11-15 ENCOUNTER — Ambulatory Visit (INDEPENDENT_AMBULATORY_CARE_PROVIDER_SITE_OTHER): Payer: Medicare Other | Admitting: Internal Medicine

## 2021-11-15 ENCOUNTER — Encounter: Payer: Self-pay | Admitting: Internal Medicine

## 2021-11-15 ENCOUNTER — Other Ambulatory Visit: Payer: Self-pay | Admitting: Internal Medicine

## 2021-11-15 VITALS — BP 110/60 | HR 66 | Temp 97.6°F | Ht 70.0 in | Wt 175.2 lb

## 2021-11-15 DIAGNOSIS — R296 Repeated falls: Secondary | ICD-10-CM | POA: Diagnosis not present

## 2021-11-15 DIAGNOSIS — R0602 Shortness of breath: Secondary | ICD-10-CM

## 2021-11-15 DIAGNOSIS — Z23 Encounter for immunization: Secondary | ICD-10-CM

## 2021-11-15 NOTE — Progress Notes (Signed)
Established Patient Office Visit     CC/Reason for Visit: Frequent falls  HPI: Charles Lloyd. is a 81 y.o. male who is coming in today for the above mentioned reasons.  He has had 2 falls in the last week.  Both have been ground-level falls.  He feels like his legs give out on him.  He has significant peripheral neuropathy and also severe lumbar spinal stenosis.  He has not had any physical therapy.  Last time this happened he had severe electrolyte deficiencies so he is hoping for lab draw today.  He did not lose consciousness or have significant injury with either fall.  He fell onto carpeted floor both times.  He is having a stair lift installed in his house and was told he can get a discount if he had a doctor's prescription.   Past Medical/Surgical History: Past Medical History:  Diagnosis Date   Acute tracheobronchitis 01/05/2019   B12 deficiency    Basal cell carcinoma (BCC) of left side of nose 01/28/2018   Cancer ([place])    Cardiomyopathy ([place]) 03/10/2018   With chronic atrial fibrillation   CHF (congestive heart failure) ([place]) 04/16/2013   Functional class II, ejection fraction 35-40%  Formatting of this note might be different from the original. Functional class II, ejection fraction 35-40%   Chronic anticoagulation    Chronic diastolic (congestive) heart failure ([place]) 04/16/2013   Functional class II, ejection fraction 35-40%  Last Assessment & Plan:  Clinically stable Volume well controlled meds reviewed   Chronic diastolic CHF (congestive heart failure) ([place]) 04/16/2013   Functional class II, ejection fraction 35-40%  Last Assessment & Plan:  Clinically stable Volume well controlled meds reviewed   Colon polyps 04/16/2013   Diabetic neuropathy (HCC)    Eosinophil count raised 02/17/2019   Essential hypertension 01/06/2010   Last Assessment & Plan:  Well controlled Continue med management   Gout    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  Steroid usage was discontinued several months ago.  Follow up with Dermatology as planned.   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   Last Assessment & Plan:  Id following ?ongoing abx managementy reported Request records   Infection of prosthetic right knee joint ([place])    Insomnia 03/04/2012   Grief with loss of wife 02/20/12  Last Assessment & Plan:  Improved sx  contineu xanax prn Se discussed   Lesion of liver 04/20/2019   -On cardiac CT 02/2019   Mild reactive airways disease    Neoplasm of prostate 04/16/2013   Neoplasm of prostate, 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 Transthoracic Echocardiogram-03/10/2013-[facility]: Normal left ventricular wall thickness and cavity size.  Global left ventricular systolic function is moderately reduced.  The estimated ejection fraction is 35-40%.  The left atrium is moderately enlarged.  The right atrium is mildly enlarged.  No significant valvular    Overweight (BMI 25.0-29.9) 10/22/2016   Persistent atrial fibrillation ([place]) 04/16/2013   Last Assessment & Plan:  Rate controlled Continue med management eliquis for stroke preventino  Formatting of this note might be different from the original.  Drug  HX Current Rx Pre-ABL inefficacy Pre-ABL intolerant Post-ABL inefficacy  Post-ABL intolerant max dose/24h M/Y end comments  sotalol                  dofetilide                  flecainide                  propafenone                  am   S/P TKR (total knee replacement), bilateral    Secondary hypercoagulable state ([place]) 04/09/2019   Tinea cruris 04/16/2013   Type 2 diabetes mellitus with diabetic polyneuropathy, without long-term current use of insulin ([place]) 04/16/2013   Last Assessment & Plan:  Labs today Pt reports well controlled on ambulatory monitoring   Vitamin D deficiency 07/25/2018    Past Surgical History:  Procedure Laterality Date   ATRIAL FIBRILLATION ABLATION N/A 03/12/2019   Procedure: ATRIAL FIBRILLATION ABLATION;  Surgeon: [name], MD;  Location: [facility];  Service: Cardiovascular;  Laterality: N/A;   ATRIAL FIBRILLATION ABLATION N/A 04/29/2020   Procedure: ATRIAL FIBRILLATION ABLATION;  Surgeon: [name], MD;  Location: [facility];  Service: Cardiovascular;  Laterality: N/A;   CARDIAC ELECTROPHYSIOLOGY STUDY AND ABLATION     CARDIOVERSION     CARDIOVERSION N/A 10/10/2018   Procedure: CARDIOVERSION;  Surgeon: [name], MD;  Location[facility] ENDOSCOPY;  Service: Cardiovascular;  Laterality: N/A;   CARDIOVERSION N/A 05/18/2020   Procedure: CARDIOVERSION;  Surgeon: [name], MD;  Location: [facility] ENDOSCOPY;  Service: Cardiovascular;  Laterality: N/A;   CARDIOVERSION N/A 12/09/2020   Procedure: CARDIOVERSION;  Surgeon: Lelon Perla, MD;  Location: [facility] ENDOSCOPY;  Service: Cardiovascular;  Laterality: N/A;   CHOLECYSTECTOMY     PROSTATECTOMY     REPLACEMENT TOTAL KNEE BILATERAL      Social History:  reports that he has never smoked. He has never used smokeless tobacco. He reports current alcohol use of about 14.0 standard drinks of alcohol per week. He reports that he does not use drugs.  Allergies: No Known Allergies  Family History:  Family History  Problem Relation Age of Onset   Cancer  Mother    Depression Mother    Early death Mother    Cancer Father    Depression Father    Early death Father      Current Outpatient Medications:    ACCU-CHEK AVIVA PLUS test strip, 1 EACH BY OTHER ROUTE DAILY. DX E11.9, Disp: 100 strip, Rfl: 12   albuterol (VENTOLIN HFA) 108 (90 Base) MCG/ACT inhaler, Inhale 1-2 puffs into the lungs every 6 (six) hours as needed for wheezing or shortness of breath., Disp: 8 g, Rfl: 2   allopurinol (ZYLOPRIM) 300 MG tablet, Take 1 tablet (300 mg total) by mouth daily. (Patient taking differently: Take 300 mg by mouth daily as needed (gout).), Disp: 90 tablet, Rfl: 1   atorvastatin (LIPITOR) 20 MG tablet, TAKE 1 TABLET DAILY (Patient taking differently: Take 20 mg by mouth daily.), Disp: 90 tablet, Rfl: 1   carvedilol (COREG) 12.5 MG tablet,  Take 12.5 mg by mouth 2 (two) times daily., Disp: , Rfl:    colchicine 0.6 MG tablet, Take 0.6 mg by mouth 4 (four) times daily as needed., Disp: , Rfl:    Continuous Blood Gluc Receiver (FREESTYLE LIBRE 2 READER) DEVI, 1 each by Does not apply route daily., Disp: 1 each, Rfl: 2   Continuous Blood Gluc Sensor (FREESTYLE LIBRE 2 SENSOR) MISC, 1 each by Does not apply route daily., Disp: 2 each, Rfl: 6   ELIQUIS 5 MG TABS tablet, TAKE 1 TABLET TWICE A DAY, Disp: 180 tablet, Rfl: 3   flecainide (TAMBOCOR) 100 MG tablet, TAKE 1 TABLET BY MOUTH TWICE A DAY, Disp: 180 tablet, Rfl: 3   fluticasone (FLONASE) 50 MCG/ACT nasal spray, Place 2 sprays into both nostrils daily. (Patient taking differently: Place 2 sprays into both nostrils daily as needed for allergies.), Disp: 16 g, Rfl: 2   fluticasone furoate-vilanterol (BREO ELLIPTA) 100-25 MCG/INH AEPB, Inhale 1 puff into the lungs daily. (Patient taking differently: Inhale 1 puff into the lungs daily as needed (SOB).), Disp: 60 each, Rfl: 11   gabapentin (NEURONTIN) 300 MG capsule, TAKE 2 CAPSULES BY MOUTH AT BEDTIME (Patient taking differently: Take 600 mg by mouth at bedtime.),  Disp: 60 capsule, Rfl: 2   insulin glargine (LANTUS SOLOSTAR) 100 UNIT/ML Solostar Pen, Inject 8 Units into the skin daily., Disp: 15 mL, Rfl: 0   KLOR-CON M20 20 MEQ tablet, Take 1 tablet (20 mEq total) by mouth daily., Disp: , Rfl:    montelukast (SINGULAIR) 10 MG tablet, Take 10 mg by mouth at bedtime. , Disp: , Rfl:    RESTASIS 0.05 % ophthalmic emulsion, Place 1 drop into both eyes 2 (two) times daily as needed (dry eyes)., Disp: , Rfl:    torsemide (DEMADEX) 20 MG tablet, Take 1 tablet (20 mg total) by mouth 2 (two) times daily., Disp: 180 tablet, Rfl: 3   triamcinolone cream (KENALOG) 0.1 %, Apply 1 application. topically 2 (two) times daily., Disp: 453 g, Rfl: 0   valsartan (DIOVAN) 160 MG tablet, Take 1 tablet (160 mg total) by mouth daily., Disp: 90 tablet, Rfl: 1  Review of Systems:  Constitutional: Denies fever, chills, diaphoresis, appetite change. HEENT: Denies photophobia, eye pain, redness, hearing loss, ear pain, congestion, sore throat, rhinorrhea, sneezing, mouth sores, trouble swallowing, neck pain, neck stiffness and tinnitus.   Respiratory: Denies SOB, DOE, cough, chest tightness,  and wheezing.   Cardiovascular: Denies chest pain, palpitations and leg swelling.  Gastrointestinal: Denies nausea, vomiting, abdominal pain, diarrhea, constipation, blood in stool and abdominal distention.  Genitourinary: Denies dysuria, urgency, frequency, hematuria, flank pain and difficulty urinating.  Endocrine: Denies: hot or cold intolerance, sweats, changes in hair or nails, polyuria, polydipsia. Musculoskeletal: Denies myalgias, back pain, joint swelling, arthralgias and gait problem.  Skin: Denies pallor, rash and wound.  Neurological: Denies dizziness, seizures, syncope,  light-headedness, numbness and headaches.  Hematological: Denies adenopathy. Easy bruising, personal or family bleeding history  Psychiatric/Behavioral: Denies suicidal ideation, mood changes, confusion, nervousness,  sleep disturbance and agitation    Physical Exam: Vitals:   11/15/21 1451  BP: 110/60  Pulse: 66  Temp: 97.6 F (36.4 C)  SpO2: 98%  Weight: 175 lb 3.2 oz (79.5 kg)  Height: '5\' 10"'$  (1.778 m)    Body mass index is 25.14 kg/m.   Constitutional: NAD, calm, comfortable Eyes: PERRL, lids and conjunctivae normal ENMT: Mucous membranes are moist.  Respiratory: clear to auscultation bilaterally, no wheezing, no crackles. Normal  respiratory effort. No accessory muscle use.  Cardiovascular: Regular rate and rhythm, no murmurs / rubs / gallops.  2+ pitting bilateral lower extremity edema. Psychiatric: Normal judgment and insight. Alert and oriented x 3. Normal mood.    Impression and Plan:  Frequent falls - Plan: Comprehensive metabolic panel, Magnesium, Ambulatory referral to Physical Therapy  Need for immunization against influenza - Plan: Flu Vaccine QUAD High Dose(Fluad)  -Flu vaccine administered in office today. -Since this happened previously in the setting of electrolyte deficiencies, I will check them today. -His lumbar spinal stenosis may also be playing a role and he is already being seen by neurosurgery for this. -I suspect his peripheral neuropathy is at play, it is good that he is using a cane. -PT referral placed. -I will write a prescription for his stair lift.   Time spent:32 minutes reviewing chart, interviewing and examining patient and formulating plan of care.     Lelon Frohlich, MD [facility]

## 2021-11-16 ENCOUNTER — Encounter (HOSPITAL_COMMUNITY): Payer: Self-pay

## 2021-11-16 ENCOUNTER — Other Ambulatory Visit: Payer: Self-pay

## 2021-11-16 ENCOUNTER — Emergency Department (HOSPITAL_COMMUNITY): Payer: Medicare Other

## 2021-11-16 ENCOUNTER — Telehealth: Payer: Self-pay

## 2021-11-16 ENCOUNTER — Observation Stay (HOSPITAL_COMMUNITY)
Admission: EM | Admit: 2021-11-16 | Discharge: 2021-11-18 | Disposition: A | Discharge status: Home / Self Care | Payer: Medicare Other | Attending: Internal Medicine | Admitting: Internal Medicine

## 2021-11-16 DIAGNOSIS — E1140 Type 2 diabetes mellitus with diabetic neuropathy, unspecified: Secondary | ICD-10-CM | POA: Diagnosis not present

## 2021-11-16 DIAGNOSIS — J45909 Unspecified asthma, uncomplicated: Secondary | ICD-10-CM | POA: Diagnosis not present

## 2021-11-16 DIAGNOSIS — Z85828 Personal history of other malignant neoplasm of skin: Secondary | ICD-10-CM | POA: Insufficient documentation

## 2021-11-16 DIAGNOSIS — N1832 Chronic kidney disease, stage 3b: Secondary | ICD-10-CM | POA: Diagnosis not present

## 2021-11-16 DIAGNOSIS — Z79899 Other long term (current) drug therapy: Secondary | ICD-10-CM | POA: Insufficient documentation

## 2021-11-16 DIAGNOSIS — E1142 Type 2 diabetes mellitus with diabetic polyneuropathy: Secondary | ICD-10-CM

## 2021-11-16 DIAGNOSIS — I130 Hypertensive heart and chronic kidney disease with heart failure and stage 1 through stage 4 chronic kidney disease, or unspecified chronic kidney disease: Secondary | ICD-10-CM | POA: Insufficient documentation

## 2021-11-16 DIAGNOSIS — N179 Acute kidney failure, unspecified: Secondary | ICD-10-CM | POA: Diagnosis not present

## 2021-11-16 DIAGNOSIS — Z8546 Personal history of malignant neoplasm of prostate: Secondary | ICD-10-CM | POA: Diagnosis not present

## 2021-11-16 DIAGNOSIS — Z7901 Long term (current) use of anticoagulants: Secondary | ICD-10-CM | POA: Insufficient documentation

## 2021-11-16 DIAGNOSIS — I480 Paroxysmal atrial fibrillation: Secondary | ICD-10-CM

## 2021-11-16 DIAGNOSIS — E1169 Type 2 diabetes mellitus with other specified complication: Secondary | ICD-10-CM

## 2021-11-16 DIAGNOSIS — R7989 Other specified abnormal findings of blood chemistry: Secondary | ICD-10-CM | POA: Diagnosis present

## 2021-11-16 DIAGNOSIS — W010XXA Fall on same level from slipping, tripping and stumbling without subsequent striking against object, initial encounter: Secondary | ICD-10-CM | POA: Diagnosis not present

## 2021-11-16 DIAGNOSIS — Z96653 Presence of artificial knee joint, bilateral: Secondary | ICD-10-CM | POA: Diagnosis not present

## 2021-11-16 DIAGNOSIS — Z794 Long term (current) use of insulin: Secondary | ICD-10-CM | POA: Diagnosis not present

## 2021-11-16 DIAGNOSIS — S0990XA Unspecified injury of head, initial encounter: Secondary | ICD-10-CM | POA: Insufficient documentation

## 2021-11-16 DIAGNOSIS — N19 Unspecified kidney failure: Secondary | ICD-10-CM

## 2021-11-16 DIAGNOSIS — I4819 Other persistent atrial fibrillation: Secondary | ICD-10-CM | POA: Insufficient documentation

## 2021-11-16 DIAGNOSIS — I5032 Chronic diastolic (congestive) heart failure: Secondary | ICD-10-CM | POA: Diagnosis not present

## 2021-11-16 DIAGNOSIS — E7800 Pure hypercholesterolemia, unspecified: Secondary | ICD-10-CM

## 2021-11-16 DIAGNOSIS — J4530 Mild persistent asthma, uncomplicated: Secondary | ICD-10-CM | POA: Diagnosis not present

## 2021-11-16 DIAGNOSIS — C61 Malignant neoplasm of prostate: Secondary | ICD-10-CM | POA: Diagnosis present

## 2021-11-16 DIAGNOSIS — I10 Essential (primary) hypertension: Secondary | ICD-10-CM

## 2021-11-16 DIAGNOSIS — E785 Hyperlipidemia, unspecified: Secondary | ICD-10-CM

## 2021-11-16 DIAGNOSIS — I110 Hypertensive heart disease with heart failure: Secondary | ICD-10-CM

## 2021-11-16 LAB — COMPREHENSIVE METABOLIC PANEL
ALT: 35 U/L (ref 0–53)
ALT: 37 U/L (ref 0–44)
AST: 39 U/L — ABNORMAL HIGH (ref 0–37)
AST: 37 U/L (ref 15–41)
Albumin: 3.1 g/dL — ABNORMAL LOW (ref 3.5–5.2)
Albumin: 2.9 g/dL — ABNORMAL LOW (ref 3.5–5.0)
Alkaline Phosphatase: 88 U/L (ref 39–117)
Alkaline Phosphatase: 71 U/L (ref 38–126)
Anion gap: 7 (ref 5–15)
BUN: 131 mg/dL (ref 6–23)
BUN: 138 mg/dL — ABNORMAL HIGH (ref 8–23)
CO2: 22 mEq/L (ref 19–32)
CO2: 21 mmol/L — ABNORMAL LOW (ref 22–32)
Calcium: 8.7 mg/dL (ref 8.4–10.5)
Calcium: 8.8 mg/dL — ABNORMAL LOW (ref 8.9–10.3)
Chloride: 105 mEq/L (ref 96–112)
Chloride: 110 mmol/L (ref 98–111)
Creatinine, Ser: 3.5 mg/dL — ABNORMAL HIGH (ref 0.40–1.50)
Creatinine, Ser: 3.16 mg/dL — ABNORMAL HIGH (ref 0.61–1.24)
GFR, Estimated: 19 mL/min — ABNORMAL LOW (ref >60)
GFR: 15.99 mL/min — ABNORMAL LOW (ref >60.00)
Glucose, Bld: 142 mg/dL — ABNORMAL HIGH (ref 70–99)
Glucose, Bld: 124 mg/dL — ABNORMAL HIGH (ref 70–99)
Potassium: 4 mEq/L (ref 3.5–5.1)
Potassium: 3.9 mmol/L (ref 3.5–5.1)
Sodium: 137 mEq/L (ref 135–145)
Sodium: 138 mmol/L (ref 135–145)
Total Bilirubin: 0.8 mg/dL (ref 0.2–1.2)
Total Bilirubin: 0.9 mg/dL (ref 0.3–1.2)
Total Protein: 5.7 g/dL — ABNORMAL LOW (ref 6.0–8.3)
Total Protein: 5.6 g/dL — ABNORMAL LOW (ref 6.5–8.1)

## 2021-11-16 LAB — CBC WITH DIFFERENTIAL/PLATELET
Abs Immature Granulocytes: 0.05 10*3/uL (ref 0.00–0.07)
Basophils Absolute: 0 10*3/uL (ref 0.0–0.1)
Basophils Relative: 0 %
Eosinophils Absolute: 0.1 10*3/uL (ref 0.0–0.5)
Eosinophils Relative: 1 %
HCT: 33.1 % — ABNORMAL LOW (ref 39.0–52.0)
Hemoglobin: 10.8 g/dL — ABNORMAL LOW (ref 13.0–17.0)
Immature Granulocytes: 1 %
Lymphocytes Relative: 18 %
Lymphs Abs: 1 10*3/uL (ref 0.7–4.0)
MCH: 32.6 pg (ref 26.0–34.0)
MCHC: 32.6 g/dL (ref 30.0–36.0)
MCV: 100 fL (ref 80.0–100.0)
Monocytes Absolute: 0.6 10*3/uL (ref 0.1–1.0)
Monocytes Relative: 11 %
Neutro Abs: 3.8 10*3/uL (ref 1.7–7.7)
Neutrophils Relative %: 69 %
Platelets: 163 10*3/uL (ref 150–400)
RBC: 3.31 MIL/uL — ABNORMAL LOW (ref 4.22–5.81)
RDW: 16.8 % — ABNORMAL HIGH (ref 11.5–15.5)
WBC: 5.5 10*3/uL (ref 4.0–10.5)
nRBC: 0 % (ref 0.0–0.2)

## 2021-11-16 LAB — URINALYSIS, ROUTINE W REFLEX MICROSCOPIC
Bilirubin Urine: NEGATIVE
Glucose, UA: NEGATIVE mg/dL
Hgb urine dipstick: NEGATIVE
Ketones, ur: NEGATIVE mg/dL
Leukocytes,Ua: NEGATIVE
Nitrite: NEGATIVE
Protein, ur: NEGATIVE mg/dL
Specific Gravity, Urine: 1.009 (ref 1.005–1.030)
pH: 5 (ref 5.0–8.0)

## 2021-11-16 LAB — MAGNESIUM
Magnesium: 2.1 mg/dL (ref 1.5–2.5)
Magnesium: 2.2 mg/dL (ref 1.7–2.4)

## 2021-11-16 LAB — TSH: TSH: 1.234 u[IU]/mL (ref 0.350–4.500)

## 2021-11-16 LAB — CK: Total CK: 105 U/L (ref 49–397)

## 2021-11-16 LAB — BRAIN NATRIURETIC PEPTIDE: B Natriuretic Peptide: 47.1 pg/mL (ref 0.0–100.0)

## 2021-11-16 LAB — TROPONIN I (HIGH SENSITIVITY): Troponin I (High Sensitivity): 5 ng/L (ref <18)

## 2021-11-16 MED ORDER — CARVEDILOL 12.5 MG PO TABS
12.5000 mg | ORAL_TABLET | Freq: Two times a day (BID) | ORAL | Status: DC
  Administered 2021-11-16: 12.5 mg via ORAL
  Filled 2021-11-16: qty 1

## 2021-11-16 MED ORDER — FLUTICASONE FUROATE-VILANTEROL 100-25 MCG/ACT IN AEPB
1.0000 | INHALATION_SPRAY | Freq: Every day | RESPIRATORY_TRACT | Status: DC
  Filled 2021-11-16: qty 28

## 2021-11-16 MED ORDER — CYCLOSPORINE 0.05 % OP EMUL: 1.0000 [drp] | Freq: Two times a day (BID) | OPHTHALMIC | Status: DC | PRN

## 2021-11-16 MED ORDER — ACETAMINOPHEN 325 MG PO TABS: 650.0000 mg | ORAL_TABLET | Freq: Four times a day (QID) | ORAL | Status: DC | PRN

## 2021-11-16 MED ORDER — FLECAINIDE ACETATE 100 MG PO TABS
100.0000 mg | ORAL_TABLET | Freq: Two times a day (BID) | ORAL | Status: DC
  Administered 2021-11-16 – 2021-11-18 (×4): 100 mg via ORAL
  Filled 2021-11-16 (×4): qty 1

## 2021-11-16 MED ORDER — GABAPENTIN 300 MG PO CAPS
600.0000 mg | ORAL_CAPSULE | Freq: Every day | ORAL | Status: DC
  Administered 2021-11-16 – 2021-11-17 (×2): 600 mg via ORAL
  Filled 2021-11-16 (×2): qty 2

## 2021-11-16 MED ORDER — APIXABAN 5 MG PO TABS
5.0000 mg | ORAL_TABLET | Freq: Two times a day (BID) | ORAL | Status: DC
  Administered 2021-11-16 – 2021-11-18 (×4): 5 mg via ORAL
  Filled 2021-11-16 (×4): qty 1

## 2021-11-16 MED ORDER — SODIUM CHLORIDE 0.9% FLUSH
3.0000 mL | Freq: Two times a day (BID) | INTRAVENOUS | Status: DC
  Administered 2021-11-16 – 2021-11-17 (×2): 3 mL via INTRAVENOUS

## 2021-11-16 MED ORDER — ATORVASTATIN CALCIUM 20 MG PO TABS
20.0000 mg | ORAL_TABLET | Freq: Every day | ORAL | Status: DC
  Administered 2021-11-17 – 2021-11-18 (×2): 20 mg via ORAL
  Filled 2021-11-16 (×2): qty 1

## 2021-11-16 MED ORDER — ALBUTEROL SULFATE (2.5 MG/3ML) 0.083% IN NEBU: 3.0000 mL | INHALATION_SOLUTION | Freq: Four times a day (QID) | RESPIRATORY_TRACT | Status: DC | PRN

## 2021-11-16 MED ORDER — SODIUM CHLORIDE 0.9 % IV BOLUS
1000.0000 mL | Freq: Once | INTRAVENOUS | Status: AC
  Administered 2021-11-16: 1000 mL via INTRAVENOUS

## 2021-11-16 MED ORDER — MONTELUKAST SODIUM 10 MG PO TABS
10.0000 mg | ORAL_TABLET | Freq: Every day | ORAL | Status: DC
  Administered 2021-11-16 – 2021-11-17 (×2): 10 mg via ORAL
  Filled 2021-11-16 (×2): qty 1

## 2021-11-16 MED ORDER — ACETAMINOPHEN 650 MG RE SUPP: 650.0000 mg | Freq: Four times a day (QID) | RECTAL | Status: DC | PRN

## 2021-11-16 MED ORDER — POLYETHYLENE GLYCOL 3350 17 G PO PACK: 17.0000 g | PACK | Freq: Every day | ORAL | Status: DC | PRN

## 2021-11-16 NOTE — ED Triage Notes (Signed)
Pt had a fall yesterday, pt here with son, c/o weakness. Pt seen with PCP yesterday. Pt told to come to ED due to kidney labs. Pt c/o lower extremity edema x1 week. Pt has hx of A Fib, been taking his prescribed diuretics.

## 2021-11-16 NOTE — H&P (Signed)
History and Physical   [name] GTX:[id number] DOB: 1940-08-09 DOA: 11/16/2021  PCP: Isaac Bliss, [name], MD   Patient coming from: Home  Chief Complaint: Abnormal labs  HPI: Charles Lloyd. is a 81 y.o. male with medical history significant of pancytopenia, paroxysmal A-fib, hypertension, hyperlipidemia, diabetes, CHF, prostate cancer, asthma, hypertension, CKD 3B, gout presenting with abnormal labs.  Patient has had some ongoing fatigue for the last week and a half or 2.  Did have a fall yesterday which is not unusual for him as he has falls related to mechanical fall secondary to neuropathy.  Similar episode this time where he tripped on an object that he did not feel.  Went to his PCP yesterday and had some lab work done.  Was called today and said to present to the ED due to abnormal renal function.  Significantly elevated BUN and creatinine.  He does report increasing lower extreme edema for the last couple weeks and had Lasix dose doubled for the past 4 days.  He denies fevers, chills, chest pain, shortness of breath, abdominal pain, constipation, diarrhea, nausea, vomiting.  ED Course: Vital signs in the ED significant for blood pressure in the 24M to 250I systolic.  Lab work-up included CMP with bicarb 21, BUN of 138, creatinine 3.16 from baseline 1.6, glucose 124, calcium 8.8, albumin 2.9, protein 5.6.  CBC with hemoglobin stable at 10.8.  Troponin negative repeat pending.  BNP normal.  CK normal.  TSH normal.  Urinalysis normal.  Magnesium normal.  Chest x-ray without acute abnormality.  CT head without acute abnormality.  CT renal stone study without acute abnormality.  But did demonstrate liver hypodensities that were increased from previous study.  Patient received a liter of fluid in the ED.  Review of Systems: As per HPI otherwise all other systems reviewed and are negative.  Past Medical History:  Diagnosis Date   Acute tracheobronchitis 01/05/2019    B12 deficiency    Basal cell carcinoma (BCC) of left side of nose 01/28/2018   Cancer ([place])    Cardiomyopathy ([place]) 03/10/2018   With chronic atrial fibrillation   CHF (congestive heart failure) ([place]) 04/16/2013   Functional class II, ejection fraction 35-40%  Formatting of this note might be different from the original. Functional class II, ejection fraction 35-40%   Chronic anticoagulation    Chronic diastolic (congestive) heart failure ([place]) 04/16/2013   Functional class II, ejection fraction 35-40%  Last Assessment & Plan:  Clinically stable Volume well controlled meds reviewed   Chronic diastolic CHF (congestive heart failure) ([place]) 04/16/2013   Functional class II, ejection fraction 35-40%  Last Assessment & Plan:  Clinically stable Volume well controlled meds reviewed   Colon polyps 04/16/2013   Diabetic neuropathy (HCC)    Eosinophil count raised 02/17/2019   Essential hypertension 01/06/2010   Last Assessment & Plan:  Well controlled Continue med management   Gout    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  Steroid usage was discontinued several months ago.  Follow up with Dermatology as planned.   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   Last Assessment &  Plan:  Id following ?ongoing abx managementy reported Request records   Infection of prosthetic right knee joint ([place])    Insomnia 03/04/2012   Grief with loss of wife 02/20/12  Last Assessment & Plan:  Improved sx  contineu xanax prn Se discussed   Lesion of liver 04/20/2019   -On cardiac CT 02/2019   Mild reactive airways disease    Neoplasm  of prostate 04/16/2013   Neoplasm of prostate, malignant ([place]) 01/06/2010   [name] S/p radiation therapy   Last Assessment & Plan:  Repeat psa today No urinary sx Pt reported fatigue/poor appetite   On amiodarone therapy 09/07/2013   On continuous oral anticoagulation 01/28/2018   Other activity(E029.9) 04/20/2013   Formatting of this note might be different from the original. Transthoracic Echocardiogram-03/10/2013-[facility]: Normal left ventricular wall thickness and cavity size.  Global left ventricular systolic function is moderately reduced.  The estimated ejection fraction is 35-40%.  The left atrium is moderately enlarged.  The right atrium is mildly enlarged.  No significant valvular    Overweight (BMI 25.0-29.9) 10/22/2016   Persistent atrial fibrillation ([place]) 04/16/2013   Last Assessment & Plan:  Rate controlled Continue med management eliquis for stroke preventino  Formatting of this note might be different from the original.  Drug  HX Current Rx Pre-ABL inefficacy Pre-ABL intolerant Post-ABL inefficacy Post-ABL intolerant max dose/24h M/Y end comments  sotalol                  dofetilide                  flecainide                  propafenone                  am   S/P TKR (total knee replacement), bilateral    Secondary hypercoagulable state ([place]) 04/09/2019   Tinea cruris 04/16/2013   Type 2 diabetes mellitus with diabetic polyneuropathy, without long-term current use of insulin ([place]) 04/16/2013   Last Assessment & Plan:  Labs today Pt reports well controlled on ambulatory monitoring   Vitamin D deficiency 07/25/2018    Past Surgical History:  Procedure Laterality Date   ATRIAL FIBRILLATION ABLATION N/A 03/12/2019   Procedure: ATRIAL FIBRILLATION ABLATION;  Surgeon: [name], MD;  Location: [facility];  Service: Cardiovascular;  Laterality: N/A;   ATRIAL FIBRILLATION ABLATION N/A 04/29/2020   Procedure: ATRIAL FIBRILLATION ABLATION;  Surgeon:  [name], MD;  Location: [facility];  Service: Cardiovascular;  Laterality: N/A;   CARDIAC ELECTROPHYSIOLOGY STUDY AND ABLATION     CARDIOVERSION     CARDIOVERSION N/A 10/10/2018   Procedure: CARDIOVERSION;  Surgeon: [name], MD;  Location[facility] ENDOSCOPY;  Service: Cardiovascular;  Laterality: N/A;   CARDIOVERSION N/A 05/18/2020   Procedure: CARDIOVERSION;  Surgeon: [name], MD;  Location: [facility] ENDOSCOPY;  Service: Cardiovascular;  Laterality: N/A;   CARDIOVERSION N/A 12/09/2020   Procedure: CARDIOVERSION;  Surgeon: Lelon Perla, MD;  Location: [facility] ENDOSCOPY;  Service: Cardiovascular;  Laterality: N/A;   CHOLECYSTECTOMY     PROSTATECTOMY     REPLACEMENT TOTAL KNEE BILATERAL      Social History  reports that he has never smoked. He has never used smokeless tobacco. He reports current alcohol use of about 14.0 standard drinks of alcohol per week. He reports that he does not use drugs.  No Known Allergies  Family  History  Problem Relation Age of Onset   Cancer Mother    Depression Mother    Early death Mother    Cancer Father    Depression Father    Early death Father   Reviewed on admission  Prior to Admission medications   Medication Sig Start Date End Date Taking? Authorizing Provider  ACCU-CHEK AVIVA PLUS test strip 1 EACH BY OTHER ROUTE DAILY. DX E11.9 08/28/21   Isaac Bliss, [name], MD  albuterol (VENTOLIN HFA) 108 (90 Base) MCG/ACT inhaler Inhale 1-2 puffs into the lungs every 6 (six) hours as needed for wheezing or shortness of breath. 10/05/20   Parrett, Fonnie Mu, NP  allopurinol (ZYLOPRIM) 300 MG tablet Take 1 tablet (300 mg total) by mouth daily. Patient taking differently: Take 300 mg by mouth daily as needed (gout). 07/18/21   Isaac Bliss, [name], MD  atorvastatin (LIPITOR) 20 MG tablet TAKE 1 TABLET DAILY Patient taking differently: Take 20 mg by mouth daily. 06/14/21   Isaac Bliss, [name], MD  carvedilol (COREG) 12.5  MG tablet Take 12.5 mg by mouth 2 (two) times daily. 09/25/21   [provider]  colchicine 0.6 MG tablet Take 0.6 mg by mouth 4 (four) times daily as needed.    [provider]  Continuous Blood Gluc Receiver (FREESTYLE LIBRE 2 READER) DEVI 1 each by Does not apply route daily. 08/28/21   Isaac Bliss, [name], MD  Continuous Blood Gluc Sensor (FREESTYLE LIBRE 2 SENSOR) MISC 1 each by Does not apply route daily. 08/28/21   Isaac Bliss, [name], MD  ELIQUIS 5 MG TABS tablet TAKE 1 TABLET TWICE A DAY 10/02/21   Isaac Bliss, [name], MD  flecainide [facility]) 100 MG tablet TAKE 1 TABLET BY MOUTH TWICE A DAY 11/03/21   Camnitz, Will Hassell Done, MD  fluticasone (FLONASE) 50 MCG/ACT nasal spray Place 2 sprays into both nostrils daily. Patient taking differently: Place 2 sprays into both nostrils daily as needed for allergies. 01/07/19   Dana Allan I, MD  fluticasone furoate-vilanterol (BREO ELLIPTA) 100-25 MCG/INH AEPB Inhale 1 puff into the lungs daily. Patient taking differently: Inhale 1 puff into the lungs daily as needed (SOB). 10/05/20   Parrett, Fonnie Mu, NP  gabapentin (NEURONTIN) 300 MG capsule TAKE 2 CAPSULES BY MOUTH AT BEDTIME Patient taking differently: Take 600 mg by mouth at bedtime. 04/17/21   Isaac Bliss, [name], MD  insulin glargine (LANTUS SOLOSTAR) 100 UNIT/ML Solostar Pen Inject 8 Units into the skin daily. 09/29/21   Eulas Post, MD  KLOR-CON M20 20 MEQ tablet TAKE 1 TABLET BY MOUTH EVERY DAY 11/16/21   Isaac Bliss, [name], MD  montelukast (SINGULAIR) 10 MG tablet Take 10 mg by mouth at bedtime.     [provider]  RESTASIS 0.05 % ophthalmic emulsion Place 1 drop into both eyes 2 (two) times daily as needed (dry eyes). 12/09/19   [provider]  torsemide (DEMADEX) 20 MG tablet Take 1 tablet (20 mg total) by mouth 2 (two) times daily. 11/13/21   Richardo Priest, MD  triamcinolone cream (KENALOG) 0.1 % Apply 1  application. topically 2 (two) times daily. 07/18/21   Isaac Bliss, [name], MD  valsartan (DIOVAN) 160 MG tablet Take 1 tablet (160 mg total) by mouth daily. 09/22/21   Richardo Priest, MD    Physical Exam: Vitals:   11/16/21 2015 11/16/21 2030 11/16/21 2045 11/16/21 2141  BP:  135/72  128/84  Pulse: 70  68 70 75  Resp:    18  Temp:    97.9 F (36.6 C)  TempSrc:    Oral  SpO2: 98% 100% 98% 100%  Weight:      Height:        Physical Exam Constitutional:      General: He is not in acute distress.    Appearance: Normal appearance.  HENT:     Head: Normocephalic and atraumatic.     Mouth/Throat:     Mouth: Mucous membranes are moist.     Pharynx: Oropharynx is clear.  Eyes:     Extraocular Movements: Extraocular movements intact.     Pupils: Pupils are equal, round, and reactive to light.  Cardiovascular:     Rate and Rhythm: Normal rate and regular rhythm.     Pulses: Normal pulses.     Heart sounds: Normal heart sounds.  Pulmonary:     Effort: Pulmonary effort is normal. No respiratory distress.     Breath sounds: Normal breath sounds.  Abdominal:     General: Bowel sounds are normal. There is no distension.     Palpations: Abdomen is soft.     Tenderness: There is no abdominal tenderness.  Musculoskeletal:        General: No swelling or deformity.     Right lower leg: Edema present.     Left lower leg: Edema present.  Skin:    General: Skin is warm and dry.  Neurological:     General: No focal deficit present.     Mental Status: Mental status is at baseline.    Labs on Admission: I have personally reviewed following labs and imaging studies  CBC: Recent Labs  Lab 11/16/21 1614  WBC 5.5  NEUTROABS 3.8  HGB 10.8*  HCT 33.1*  MCV 100.0  PLT 093    Basic Metabolic Panel: Recent Labs  Lab 11/15/21 1526 11/16/21 1614 11/16/21 1645  NA 137 138  --   K 4.0 3.9  --   CL 105 110  --   CO2 22 21*  --   GLUCOSE 142* 124*  --   BUN 131* 138*  --    CREATININE 3.50* 3.16*  --   CALCIUM 8.7 8.8*  --   MG 2.1  --  2.2    GFR: Estimated Creatinine Clearance: 19.9 mL/min (A) (by C-G formula based on SCr of 3.16 mg/dL (H)).  Liver Function Tests: Recent Labs  Lab 11/15/21 1526 11/16/21 1614  AST 39* 37  ALT 35 37  ALKPHOS 88 71  BILITOT 0.8 0.9  PROT 5.7* 5.6*  ALBUMIN 3.1* 2.9*    Urine analysis:    Component Value Date/Time   COLORURINE STRAW (A) 11/16/2021 1618   APPEARANCEUR CLEAR 11/16/2021 1618   LABSPEC 1.009 11/16/2021 1618   PHURINE 5.0 11/16/2021 1618   GLUCOSEU NEGATIVE 11/16/2021 1618   HGBUR NEGATIVE 11/16/2021 1618   BILIRUBINUR NEGATIVE 11/16/2021 1618   KETONESUR NEGATIVE 11/16/2021 1618   PROTEINUR NEGATIVE 11/16/2021 1618   NITRITE NEGATIVE 11/16/2021 1618   LEUKOCYTESUR NEGATIVE 11/16/2021 1618    Radiological Exams on Admission: CT Head Wo Contrast  Result Date: 11/16/2021 CLINICAL DATA:  Head injury after fall yesterday. EXAM: CT HEAD WITHOUT CONTRAST TECHNIQUE: Contiguous axial images were obtained from the base of the skull through the vertex without intravenous contrast. RADIATION DOSE REDUCTION: This exam was performed according to the departmental dose-optimization program which includes automated exposure control, adjustment of the mA and/or kV according to  patient size and/or use of iterative reconstruction technique. COMPARISON:  December 30, 2020. FINDINGS: Brain: Mild diffuse cortical atrophy. No mass effect or midline shift is noted. Ventricular size is within normal limits. There is no evidence of mass lesion, hemorrhage or acute infarction. Vascular: No hyperdense vessel or unexpected calcification. Skull: Normal. Negative for fracture or focal lesion. Sinuses/Orbits: No acute finding. Other: None. IMPRESSION: No acute intracranial abnormality seen. Electronically Signed   By: Marijo Conception M.D.   On: 11/16/2021 19:03   CT Renal Stone Study  Result Date: 11/16/2021 CLINICAL DATA:  [place]  yesterday, weakness, renal insufficiency, flank pain EXAM: CT ABDOMEN AND PELVIS WITHOUT CONTRAST TECHNIQUE: Multidetector CT imaging of the abdomen and pelvis was performed following the standard protocol without IV contrast. Unenhanced CT was performed per clinician order. Lack of IV contrast limits sensitivity and specificity, especially for evaluation of abdominal/pelvic solid viscera. RADIATION DOSE REDUCTION: This exam was performed according to the departmental dose-optimization program which includes automated exposure control, adjustment of the mA and/or kV according to patient size and/or use of iterative reconstruction technique. COMPARISON:  None Available. FINDINGS: Lower chest: No acute pleural or parenchymal lung disease. Hepatobiliary: Numerous subcentimeter hypodensities throughout the liver too small to characterize. Evaluation of the liver parenchyma is limited without IV contrast. No biliary duct dilation. Cholecystectomy. Pancreas: Unremarkable unenhanced appearance. Spleen: Unremarkable unenhanced appearance. Adrenals/Urinary Tract: No urinary tract calculi or obstructive uropathy within either kidney. Grossly unremarkable unenhanced appearance of the kidneys. The adrenals and bladder are unremarkable. Stomach/Bowel: No bowel obstruction or ileus. Normal appendix right lower quadrant. Colonic diverticulosis without diverticulitis. No bowel wall thickening or inflammatory change. Vascular/Lymphatic: Aortic atherosclerosis. No enlarged abdominal or pelvic lymph nodes. Reproductive: Postsurgical changes from prostatectomy. Other: No free fluid or free intraperitoneal gas. No abdominal wall hernia. Musculoskeletal: Subacute healing right anterolateral seventh through ninth rib fractures. No acute displaced fracture. Diffuse lumbar spondylosis. Reconstructed images demonstrate no additional findings. IMPRESSION: 1. Multiple subcentimeter hypodensities throughout the liver, too small to characterize.  Some of these were visible on a prior CT chest 03/09/2019, and likely reflect small cysts or hemangiomas. 2. No acute intra-abdominal or intrapelvic process. 3. Colonic diverticulosis without diverticulitis. 4.  Aortic Atherosclerosis (ICD10-I70.0). Electronically Signed   By: Randa Ngo M.D.   On: 11/16/2021 19:03   DG Chest Portable 1 View  Result Date: 11/16/2021 CLINICAL DATA:  Screening, fatigue, left-sided chest pain EXAM: PORTABLE CHEST 1 VIEW COMPARISON:  Radiograph 08/11/2021 FINDINGS: The cardiomediastinal silhouette is within normal limits. There is no focal airspace consolidation. There is no pleural effusion. No pneumothorax. No acute osseous abnormality. Bilateral shoulder degenerative changes. Thoracic spondylosis. IMPRESSION: No evidence of acute cardiopulmonary disease. Electronically Signed   By: Maurine Simmering M.D.   On: 11/16/2021 17:34    EKG: Independently reviewed.  Junctional rhythm at 64 bpm.  Nonspecific intraventricular conduction delay.  Significant baseline artifact.  Similar to previous which was read as sinus rhythm with first-degree AV block but the previous had significantly less baseline artifact.  Assessment/Plan Principal Problem:   Acute renal failure superimposed on stage 3b chronic kidney disease (HCC) Active Problems:   Paroxysmal A-fib (HCC)   Hypertensive heart disease with heart failure (HCC)   Hyperlipidemia associated with type 2 diabetes mellitus (HCC)   Chronic diastolic (congestive) heart failure (HCC)   Neoplasm of prostate, malignant (HCC)   NIDDM-2 with polyneuropathy and hyperglycemia   Hypercholesterolemia   Asthma   Essential hypertension   Uremia AKI on CKD 3B >  Patient presenting with abnormal labs from PCP.  Found to have significant elevated creatinine and BUN.  Here this was confirmed with BUN of 138 and creatinine elevated to 3.16 from baseline 1.6.  Normal magnesium and potassium.  Calcium 8.8. > No significant uremic symptoms  in terms of mentation remains at baseline. > Suspicion is for dehydration in setting of increased Lasix for the past 4 days due to lower extremity edema for the past couple weeks in the setting of heart failure as below. > No evidence of significant volume overload/CHF to lead to this AKI also normal BNP.  Residual edema could be baseline or could be related to to worsening renal function at this point. > No asterixis on exam.  > Received a liter of fluids in the ED. - Monitor on telemetry - We will hold off on further significant IV fluids due to patient's history of CHF.  Will monitor response to initial fluids given - Trend renal function and electrolytes - Holding home torsemide and valsartan - Holding home allopurinol and colchicine  Chronic diastolic CHF > Last echo was in June of this year with EF 50%, RV hyperdynamic, indeterminate diastolic function. > BNP normal in ED.  Concern for being volume down as above. - Holding home torsemide and valsartan for now while treating for AKI - Continue home carvedilol - Monitor ins and outs and daily weights  Paroxysmal A-fib - Continue home carvedilol - Continue home flecainide - Continue home Eliquis  Hypertension - Continue home carvedilol - Holding home torsemide and valsartan as above  Hyperlipidemia - Continue home atorvastatin  Diabetes - SSI  Asthma - Continue home Breo, Singulair, as needed albuterol  Gout - Holding home allopurinol and colchicine in the setting of AKI as above  DVT prophylaxis: Eliquis Code Status:   Full Family Communication:  Son updated by phone. Disposition Plan:   Patient is from:  Home  Anticipated DC to:  Home  Anticipated DC date:  1 to 5 days  Anticipated DC barriers: None  Consults called:  None Admission status:  Observation, telemetry  Severity of Illness: The appropriate patient status for this patient is OBSERVATION. Observation status is judged to be reasonable and necessary in  order to provide the required intensity of service to ensure the patient's safety. The patient's presenting symptoms, physical exam findings, and initial radiographic and laboratory data in the context of their medical condition is felt to place them at decreased risk for further clinical deterioration. Furthermore, it is anticipated that the patient will be medically stable for discharge from the hospital within 2 midnights of admission.    Marcelyn Bruins MD [facility]  How to contact the [facility] Attending or Consulting provider [place] or covering provider during after hours [place], for this patient?   Check the care team in [facility] and look for a) attending/consulting [facility] provider listed and b) the [facility] team listed Log into [email or web address] and use [place]'s universal password to access. If you do not have the password, please contact the hospital operator. Locate the [facility] provider you are looking for under [facility] and page to a number that you can be directly reached. If you still have difficulty reaching the provider, please page the [facility] (Director on Call) for the Hospitalists listed on amion for assistance.  11/16/2021, 10:08 PM

## 2021-11-16 NOTE — Telephone Encounter (Signed)
CRITICAL VALUE STICKER  CRITICAL VALUE: BUN 131  RECEIVER (on-site recipient of call): Judson Roch, CMA  DATE & TIME NOTIFIED: 11/16/21 1132  MESSENGER (representative from lab): [place]  MD NOTIFIED: Yes  TIME OF NOTIFICATION: 11/16/21 1134  RESPONSE: will wait for remainder of results.

## 2021-11-16 NOTE — ED Provider Notes (Addendum)
[facility] DEPT Provider Note   CSN: [id number] Arrival date & time: 11/16/21  1524     History  Chief Complaint  Patient presents with   Abnormal Lab    Charles Wik. is a 81 y.o. male.  Patient as above with significant medical history as below, including A-fib on Eliquis, HFrEF, hypertension, neuropathy, HLD, DM, who presents to the ED with complaint of abnormal labs.  Patient reports feeling fatigued for the past 1.5 to 2 weeks.  He went to his PCP and had labs drawn, he was called by his PCP and told reports the ED secondary to worsening renal function.  He also reports mildly worsening swelling to his bilateral lower extremities over the past 2 weeks, he was instructed to increase his Lasix approximately 4 days ago with minimal improvement to his lower extreme edema.  Patient reports that he had a fall yesterday, he has neuropathy and difficulty ambulating baseline, he was using his walker and his feet became entangled on an object on the ground he fell to the ground.  Unsure if he struck his head.  He is anticoagulated on Eliquis.  No headache.  No behavior changes per family, no chest pain or dyspnea, no nausea or vomiting, no change in bowel or bladder function, he is tolerant p.o. intake without difficulty, other than diuretic change- no other medication changes.     Past Medical History:  Diagnosis Date   Acute tracheobronchitis 01/05/2019   B12 deficiency    Basal cell carcinoma (BCC) of left side of nose 01/28/2018   Cancer ([place])    Cardiomyopathy ([place]) 03/10/2018   With chronic atrial fibrillation   CHF (congestive heart failure) ([place]) 04/16/2013   Functional class II, ejection fraction 35-40%  Formatting of this note might be different from the original. Functional class II, ejection fraction 35-40%   Chronic anticoagulation    Chronic diastolic (congestive) heart failure ([place]) 04/16/2013   Functional class II, ejection  fraction 35-40%  Last Assessment & Plan:  Clinically stable Volume well controlled meds reviewed   Chronic diastolic CHF (congestive heart failure) ([place]) 04/16/2013   Functional class II, ejection fraction 35-40%  Last Assessment & Plan:  Clinically stable Volume well controlled meds reviewed   Colon polyps 04/16/2013   Diabetic neuropathy (HCC)    Eosinophil count raised 02/17/2019   Essential hypertension 01/06/2010   Last Assessment & Plan:  Well controlled Continue med management   Gout    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  Steroid usage was discontinued several months ago.  Follow up with Dermatology as planned.   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   Last Assessment & Plan:  Id following ?ongoing abx managementy reported Request records   Infection of prosthetic right knee joint ([place])    Insomnia 03/04/2012   Grief with loss of wife 02/20/12  Last Assessment & Plan:  Improved sx  contineu xanax prn Se discussed   Lesion of liver 04/20/2019   -On cardiac CT 02/2019   Mild reactive airways disease    Neoplasm of prostate 04/16/2013   Neoplasm of prostate, 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 Transthoracic Echocardiogram-03/10/2013-[facility]: Normal left ventricular wall thickness and  cavity size.  Global left ventricular systolic function is moderately reduced.  The estimated ejection fraction is 35-40%.  The left atrium is moderately enlarged.  The right atrium is mildly enlarged.  No significant valvular    Overweight (BMI 25.0-29.9) 10/22/2016   Persistent atrial fibrillation ([place]) 04/16/2013   Last Assessment & Plan:  Rate controlled Continue med management eliquis for stroke preventino  Formatting of this note might be different from the original.  Drug  HX Current Rx Pre-ABL inefficacy Pre-ABL intolerant Post-ABL inefficacy Post-ABL intolerant max dose/24h M/Y end comments  sotalol                  dofetilide                  flecainide                  propafenone                  am   S/P TKR (total knee replacement), bilateral    Secondary hypercoagulable state ([place]) 04/09/2019   Tinea cruris 04/16/2013   Type 2 diabetes mellitus with diabetic polyneuropathy, without long-term current use of insulin ([place]) 04/16/2013   Last Assessment & Plan:  Labs today Pt reports well controlled on ambulatory monitoring   Vitamin D deficiency 07/25/2018    Past Surgical History:  Procedure Laterality Date   ATRIAL FIBRILLATION ABLATION N/A 03/12/2019   Procedure: ATRIAL FIBRILLATION ABLATION;  Surgeon: [name], MD;  Location: [facility];  Service: Cardiovascular;  Laterality: N/A;   ATRIAL FIBRILLATION ABLATION N/A 04/29/2020   Procedure: ATRIAL FIBRILLATION ABLATION;  Surgeon: [name], MD;  Location: [facility];  Service: Cardiovascular;  Laterality: N/A;   CARDIAC ELECTROPHYSIOLOGY STUDY AND ABLATION     CARDIOVERSION     CARDIOVERSION N/A 10/10/2018   Procedure: CARDIOVERSION;  Surgeon: [name], MD;  Location: [facility];  Service: Cardiovascular;  Laterality: N/A;   CARDIOVERSION N/A 05/18/2020   Procedure: CARDIOVERSION;  Surgeon: [name], MD;  Location: [facility] ENDOSCOPY;  Service: Cardiovascular;  Laterality: N/A;    CARDIOVERSION N/A 12/09/2020   Procedure: CARDIOVERSION;  Surgeon: Lelon Perla, MD;  Location: [facility] ENDOSCOPY;  Service: Cardiovascular;  Laterality: N/A;   CHOLECYSTECTOMY     PROSTATECTOMY     REPLACEMENT TOTAL KNEE BILATERAL       The history is provided by the patient and a relative. No language interpreter was used.  Abnormal Lab      Home Medications Prior to Admission medications   Medication Sig Start Date End Date Taking? Authorizing Provider  albuterol (VENTOLIN HFA) 108 (90 Base) MCG/ACT inhaler Inhale 1-2 puffs into the lungs every 6 (six) hours as needed for wheezing or shortness of breath. 10/05/20  Yes Parrett, Tammy S, NP  atorvastatin (LIPITOR) 20 MG tablet TAKE 1 TABLET DAILY Patient taking differently: Take 20 mg by mouth daily. 06/14/21  Yes Isaac Bliss, [name], MD  carvedilol (COREG) 12.5 MG tablet Take 12.5 mg by mouth 2 (two) times daily. 09/25/21  Yes [provider]  ELIQUIS 5 MG TABS tablet TAKE 1 TABLET TWICE A DAY Patient taking differently: Take 5 mg by mouth 2 (two) times daily. 10/02/21  Yes Isaac Bliss, [name], MD  flecainide (TAMBOCOR) 100 MG tablet TAKE 1 TABLET BY MOUTH TWICE A DAY 11/03/21  Yes Camnitz, Will Hassell Done, MD  fluticasone (FLONASE) 50 MCG/ACT nasal spray Place 2 sprays into both nostrils daily. Patient taking differently: Place 2 sprays into both nostrils daily as needed for allergies. 01/07/19  Yes Dana Allan I, MD  fluticasone furoate-vilanterol (BREO ELLIPTA) 100-25 MCG/INH AEPB Inhale 1 puff into the lungs daily. Patient taking differently: Inhale 1 puff into the lungs daily as needed (SOB). 10/05/20  Yes Parrett, Tammy S, NP  gabapentin (NEURONTIN) 300 MG capsule TAKE 2 CAPSULES BY MOUTH AT BEDTIME Patient taking differently: Take 600 mg by mouth at bedtime. 04/17/21  Yes Isaac Bliss, [name], MD  insulin glargine (LANTUS SOLOSTAR) 100 UNIT/ML Solostar Pen Inject 8 Units into the skin daily. 09/29/21  Yes  Burchette, Alinda Sierras, MD  KLOR-CON M20 20 MEQ tablet TAKE 1 TABLET BY MOUTH EVERY DAY 11/16/21  Yes Isaac Bliss, [name], MD  montelukast (SINGULAIR) 10 MG tablet Take 10 mg by mouth at bedtime.    Yes [provider]  RESTASIS 0.05 % ophthalmic emulsion Place 1 drop into both eyes 2 (two) times daily as needed (dry eyes). 12/09/19  Yes [provider]  torsemide (DEMADEX) 20 MG tablet Take 1 tablet (20 mg total) by mouth 2 (two) times daily. 11/13/21  Yes Richardo Priest, MD  triamcinolone cream (KENALOG) 0.1 % Apply 1 application. topically 2 (two) times daily. 07/18/21  Yes Isaac Bliss, [name], MD  valsartan (DIOVAN) 160 MG tablet Take 1 tablet (160 mg total) by mouth daily. 09/22/21  Yes Richardo Priest, MD  ACCU-CHEK AVIVA PLUS test strip 1 EACH BY OTHER ROUTE DAILY. DX E11.9 08/28/21   Isaac Bliss, [name], MD  allopurinol (ZYLOPRIM) 300 MG tablet Take 1 tablet (300 mg total) by mouth daily. Patient not taking: Reported on 11/16/2021 07/18/21   Isaac Bliss, [name], MD  Continuous Blood Gluc Receiver (FREESTYLE LIBRE 2 READER) DEVI 1 each by Does not apply route daily. 08/28/21   Isaac Bliss, [name], MD  Continuous Blood Gluc Sensor (FREESTYLE LIBRE 2 SENSOR) MISC 1 each by Does not apply route daily. 08/28/21   Isaac Bliss, [name], MD      Allergies    Patient has no known allergies.    Review of Systems   Review of Systems  Constitutional:  Positive for fatigue. Negative for chills and fever.  HENT:  Negative for facial swelling and trouble swallowing.   Eyes:  Negative for photophobia and visual disturbance.  Respiratory:  Negative for cough and shortness of breath.   Cardiovascular:  Negative for chest pain and palpitations.  Gastrointestinal:  Negative for abdominal pain, nausea and vomiting.  Endocrine: Negative for polydipsia and polyuria.  Genitourinary:  Negative for difficulty urinating and hematuria.  Musculoskeletal:  Negative  for gait problem and joint swelling.  Skin:  Negative for pallor and rash.  Neurological:  Negative for syncope and headaches.  Psychiatric/Behavioral:  Negative for agitation and confusion.     Physical Exam Updated Vital Signs BP 139/78   Pulse 77   Temp 97.6 F (36.4 C) (Oral)   Resp 18   Ht '5\' 10"'$  (1.778 m)   Wt 79.5 kg   SpO2 100%  BMI 25.14 kg/m  Physical Exam Vitals and nursing note reviewed.  Constitutional:      General: He is not in acute distress.    Appearance: Normal appearance. He is well-developed. He is not ill-appearing or diaphoretic.  HENT:     Head: Normocephalic and atraumatic. No raccoon eyes, Battle's sign, right periorbital erythema or left periorbital erythema.     Jaw: There is normal jaw occlusion.     Right Ear: External ear normal.     Left Ear: External ear normal.     Mouth/Throat:     Mouth: Mucous membranes are moist.  Eyes:     General: No scleral icterus.    Extraocular Movements: Extraocular movements intact.     Pupils: Pupils are equal, round, and reactive to light.  Cardiovascular:     Rate and Rhythm: Normal rate and regular rhythm.     Pulses: Normal pulses.     Heart sounds: Normal heart sounds.  Pulmonary:     Effort: Pulmonary effort is normal. No tachypnea, accessory muscle usage or respiratory distress.     Breath sounds: Normal breath sounds.  Abdominal:     General: Abdomen is flat.     Palpations: Abdomen is soft.     Tenderness: There is abdominal tenderness in the left lower quadrant. There is no guarding or rebound.    Musculoskeletal:        General: Normal range of motion.     Cervical back: Full passive range of motion without pain and normal range of motion.     Right lower leg: Edema present.     Left lower leg: Edema present.     Comments: 1+ pitting edema bilateral ankles, symmetric  Skin:    General: Skin is warm and dry.     Capillary Refill: Capillary refill takes less than 2 seconds.  Neurological:      Mental Status: He is alert and oriented to person, place, and time.     GCS: GCS eye subscore is 4. GCS verbal subscore is 5. GCS motor subscore is 6.     Cranial Nerves: Cranial nerves 2-12 are intact. No dysarthria.     Sensory: Sensation is intact.     Motor: Motor function is intact. No tremor.     Coordination: Coordination is intact.     Comments: Gait not tested secondary to patient safety, uses walker  No asterixis  Psychiatric:        Mood and Affect: Mood normal.        Behavior: Behavior normal.     ED Results / Procedures / Treatments   Labs (all labs ordered are listed, but only abnormal results are displayed) Labs Reviewed  CBC WITH DIFFERENTIAL/PLATELET - Abnormal; Notable for the following components:      Result Value   RBC 3.31 (*)    Hemoglobin 10.8 (*)    HCT 33.1 (*)    RDW 16.8 (*)    All other components within normal limits  COMPREHENSIVE METABOLIC PANEL - Abnormal; Notable for the following components:   CO2 21 (*)    Glucose, Bld 124 (*)    BUN 138 (*)    Creatinine, Ser 3.16 (*)    Calcium 8.8 (*)    Total Protein 5.6 (*)    Albumin 2.9 (*)    GFR, Estimated 19 (*)    All other components within normal limits  URINALYSIS, ROUTINE W REFLEX MICROSCOPIC - Abnormal; Notable for the following components:  Color, Urine STRAW (*)    All other components within normal limits  CK  MAGNESIUM  TSH  BRAIN NATRIURETIC PEPTIDE  RENAL FUNCTION PANEL  CBC  TROPONIN I (HIGH SENSITIVITY)    EKG EKG Interpretation  Date/Time:  Thursday November 16 2021 17:42:13 EDT Ventricular Rate:  64 PR Interval:    QRS Duration: 168 QT Interval:  472 QTC Calculation: 487 R Axis:   88 Text Interpretation: Junctional rhythm Nonspecific intraventricular conduction delay similar to prior Interpretation limited secondary to artifact Confirmed by Wynona Dove ([id number]) on 11/17/2021 12:15:12 AM  Radiology CT Head Wo Contrast  Result Date: 11/16/2021 CLINICAL  DATA:  Head injury after fall yesterday. EXAM: CT HEAD WITHOUT CONTRAST TECHNIQUE: Contiguous axial images were obtained from the base of the skull through the vertex without intravenous contrast. RADIATION DOSE REDUCTION: This exam was performed according to the departmental dose-optimization program which includes automated exposure control, adjustment of the mA and/or kV according to patient size and/or use of iterative reconstruction technique. COMPARISON:  December 30, 2020. FINDINGS: Brain: Mild diffuse cortical atrophy. No mass effect or midline shift is noted. Ventricular size is within normal limits. There is no evidence of mass lesion, hemorrhage or acute infarction. Vascular: No hyperdense vessel or unexpected calcification. Skull: Normal. Negative for fracture or focal lesion. Sinuses/Orbits: No acute finding. Other: None. IMPRESSION: No acute intracranial abnormality seen. Electronically Signed   By: Marijo Conception M.D.   On: 11/16/2021 19:03   CT Renal Stone Study  Result Date: 11/16/2021 CLINICAL DATA:  [place] yesterday, weakness, renal insufficiency, flank pain EXAM: CT ABDOMEN AND PELVIS WITHOUT CONTRAST TECHNIQUE: Multidetector CT imaging of the abdomen and pelvis was performed following the standard protocol without IV contrast. Unenhanced CT was performed per clinician order. Lack of IV contrast limits sensitivity and specificity, especially for evaluation of abdominal/pelvic solid viscera. RADIATION DOSE REDUCTION: This exam was performed according to the departmental dose-optimization program which includes automated exposure control, adjustment of the mA and/or kV according to patient size and/or use of iterative reconstruction technique. COMPARISON:  None Available. FINDINGS: Lower chest: No acute pleural or parenchymal lung disease. Hepatobiliary: Numerous subcentimeter hypodensities throughout the liver too small to characterize. Evaluation of the liver parenchyma is limited without IV  contrast. No biliary duct dilation. Cholecystectomy. Pancreas: Unremarkable unenhanced appearance. Spleen: Unremarkable unenhanced appearance. Adrenals/Urinary Tract: No urinary tract calculi or obstructive uropathy within either kidney. Grossly unremarkable unenhanced appearance of the kidneys. The adrenals and bladder are unremarkable. Stomach/Bowel: No bowel obstruction or ileus. Normal appendix right lower quadrant. Colonic diverticulosis without diverticulitis. No bowel wall thickening or inflammatory change. Vascular/Lymphatic: Aortic atherosclerosis. No enlarged abdominal or pelvic lymph nodes. Reproductive: Postsurgical changes from prostatectomy. Other: No free fluid or free intraperitoneal gas. No abdominal wall hernia. Musculoskeletal: Subacute healing right anterolateral seventh through ninth rib fractures. No acute displaced fracture. Diffuse lumbar spondylosis. Reconstructed images demonstrate no additional findings. IMPRESSION: 1. Multiple subcentimeter hypodensities throughout the liver, too small to characterize. Some of these were visible on a prior CT chest 03/09/2019, and likely reflect small cysts or hemangiomas. 2. No acute intra-abdominal or intrapelvic process. 3. Colonic diverticulosis without diverticulitis. 4.  Aortic Atherosclerosis (ICD10-I70.0). Electronically Signed   By: Randa Ngo M.D.   On: 11/16/2021 19:03   DG Chest Portable 1 View  Result Date: 11/16/2021 CLINICAL DATA:  Screening, fatigue, left-sided chest pain EXAM: PORTABLE CHEST 1 VIEW COMPARISON:  Radiograph 08/11/2021 FINDINGS: The cardiomediastinal silhouette is within normal limits. There is no focal  airspace consolidation. There is no pleural effusion. No pneumothorax. No acute osseous abnormality. Bilateral shoulder degenerative changes. Thoracic spondylosis. IMPRESSION: No evidence of acute cardiopulmonary disease. Electronically Signed   By: Maurine Simmering M.D.   On: 11/16/2021 17:34    Procedures .Critical  Care  Performed by: Jeanell Sparrow, DO Authorized by: Jeanell Sparrow, DO   Critical care provider statement:    Critical care time (minutes):  30   Critical care time was exclusive of:  Separately billable procedures and treating other patients   Critical care was necessary to treat or prevent imminent or life-threatening deterioration of the following conditions:  Metabolic crisis   Critical care was time spent personally by me on the following activities:  Development of treatment plan with patient or surrogate, discussions with consultants, evaluation of patient's response to treatment, examination of patient, ordering and review of laboratory studies, ordering and review of radiographic studies, ordering and performing treatments and interventions, pulse oximetry, re-evaluation of patient's condition, review of old charts and obtaining history from patient or surrogate   Care discussed with: admitting provider       Medications Ordered in ED Medications  carvedilol (COREG) tablet 12.5 mg (12.5 mg Oral Given 11/16/21 2347)  atorvastatin (LIPITOR) tablet 20 mg (has no administration in time range)  apixaban (ELIQUIS) tablet 5 mg (5 mg Oral Given 11/16/21 2347)  gabapentin (NEURONTIN) capsule 600 mg (600 mg Oral Given 11/16/21 2347)  albuterol (PROVENTIL) (2.5 MG/3ML) 0.083% nebulizer solution 3 mL (has no administration in time range)  fluticasone furoate-vilanterol (BREO ELLIPTA) 100-25 MCG/ACT 1 puff (has no administration in time range)  montelukast (SINGULAIR) tablet 10 mg (10 mg Oral Given 11/16/21 2347)  cycloSPORINE (RESTASIS) 0.05 % ophthalmic emulsion 1 drop (has no administration in time range)  sodium chloride flush (NS) 0.9 % injection 3 mL (3 mLs Intravenous Given 11/16/21 2347)  acetaminophen (TYLENOL) tablet 650 mg (has no administration in time range)    Or  acetaminophen (TYLENOL) suppository 650 mg (has no administration in time range)  polyethylene glycol (MIRALAX /  GLYCOLAX) packet 17 g (has no administration in time range)  flecainide (TAMBOCOR) tablet 100 mg (100 mg Oral Given 11/16/21 2346)  sodium chloride 0.9 % bolus 1,000 mL (0 mLs Intravenous Stopped 11/16/21 1901)    ED Course/ Medical Decision Making/ A&P                           Medical Decision Making Amount and/or Complexity of Data Reviewed Labs: ordered. Radiology: ordered.  Risk Decision regarding hospitalization.   This patient presents to the ED with chief complaint(s) of abnormal labs, fatigue with pertinent past medical history of the above which further complicates the presenting complaint. The complaint involves an extensive differential diagnosis and also carries with it a high risk of complications and morbidity.    The differential diagnosis includes but not limited to   Differential diagnosis includes but is not exclusive to acute appendicitis, renal colic, testicular torsion, urinary tract infection, prostatitis,  diverticulitis, small bowel obstruction, colitis, abdominal aortic aneurysm, gastroenteritis, constipation, dehydration, medication effect, endocrine abnormalities, infectious, metabolic etc.  . Serious etiologies were considered.   The initial plan is to screening labs, CT head, CT renal, urinalysis, IV fluids   Additional history obtained: Additional history obtained from family Records reviewed Primary Care Documents and prior labs, medications  Independent labs interpretation:  The following labs were independently interpreted:  Metabolic panel with uremia,  BUN 138, creatinine 3.16.  Baseline around 2.  Give IV fluids Labs otherwise are stable  Independent visualization of imaging: - I independently visualized the following imaging with scope of interpretation limited to determining acute life threatening conditions related to emergency care: CT renal, CT head, chest x-ray, which revealed no acute process  Cardiac monitoring was reviewed and  interpreted by myself which shows NSR  Treatment and Reassessment: IV fluids > stayed the same  Consultation: - Consulted or discussed management/test interpretation w/ external professional: na  Consideration for admission or further workup: Admission was considered   81 year old male with history as above to the ED for abnormal labs.  Patient found to be severely uremic.  Mental status is stable.  Creatinine worsened from his baseline.  Tolerating PO w/o much difficulty. He was given IV fluids.  Patient's uremia and AKI potential secondary to increased dose of Lasix he was started on recently/ dehydration. Resp status stable. On ambient air. Recommend admission for further evaluation.  Patient agreeable.  Discussed with Dr. Trilby Drummer accepts patient for admission.   Social Determinants of health: Social History   Tobacco Use   Smoking status: Never   Smokeless tobacco: Never  Vaping Use   Vaping Use: Never used  Substance Use Topics   Alcohol use: Yes    Alcohol/week: 14.0 standard drinks of alcohol    Types: 14 Glasses of wine per week   Drug use: Never            Final Clinical Impression(s) / ED Diagnoses Final diagnoses:  Uremia  AKI (acute kidney injury) [facility])    Rx / DC Orders ED Discharge Orders     None         Jeanell Sparrow, DO 11/17/21 0013    Jeanell Sparrow, DO 11/17/21 0016

## 2021-11-16 NOTE — Telephone Encounter (Signed)
Per PCP, creatinine has doubled, pt needs to go to ED. I called and spoke with patient and made him aware of PCP's recommendations. He verbalized understanding.

## 2021-11-16 NOTE — ED Provider Triage Note (Signed)
Emergency Medicine Provider Triage Evaluation Note  Charles Lloyd , a 81 y.o. male  was evaluated in triage.  Pt complains of abnormal labs. The patient reports that his BUN and Cr are elevated. He had a fall 10 days ago and yesterday. He reports some swelling to his lower extremities for the past few days as well. Denies any chest pain or SOB. He reports he had a ground level fall yesterday causing him to land on his bottom. Denies hitting his head. Son is at bedside and reports he is at baseline. Patient's diuretic was recently increased  Review of Systems  Positive:  Negative:   Physical Exam  BP 96/64 (BP Location: Left Arm)   Pulse 66   Temp 97.7 F (36.5 C) (Oral)   Resp 16   SpO2 100%  Gen:   Awake, no distress   Resp:  Normal effort  MSK:   Moves extremities without difficulty  Other:  2+ pitting edema to bilateral ankles  Medical Decision Making  Medically screening exam initiated at 4:20 PM.  Appropriate orders placed.  Charles Lloyd. was informed that the remainder of the evaluation will be completed by another provider, this initial triage assessment does not replace that evaluation, and the importance of remaining in the ED until their evaluation is complete.  Will order labs. Patient is being roomed to 6 right now.   Sherrell Puller, PA-C 11/16/21 1622

## 2021-11-17 DIAGNOSIS — N1832 Chronic kidney disease, stage 3b: Secondary | ICD-10-CM | POA: Diagnosis not present

## 2021-11-17 DIAGNOSIS — N179 Acute kidney failure, unspecified: Secondary | ICD-10-CM | POA: Diagnosis not present

## 2021-11-17 LAB — RENAL FUNCTION PANEL
Albumin: 2.6 g/dL — ABNORMAL LOW (ref 3.5–5.0)
Anion gap: 7 (ref 5–15)
BUN: 112 mg/dL — ABNORMAL HIGH (ref 8–23)
CO2: 21 mmol/L — ABNORMAL LOW (ref 22–32)
Calcium: 8.4 mg/dL — ABNORMAL LOW (ref 8.9–10.3)
Chloride: 114 mmol/L — ABNORMAL HIGH (ref 98–111)
Creatinine, Ser: 2.38 mg/dL — ABNORMAL HIGH (ref 0.61–1.24)
GFR, Estimated: 27 mL/min — ABNORMAL LOW (ref >60)
Glucose, Bld: 127 mg/dL — ABNORMAL HIGH (ref 70–99)
Phosphorus: 4.3 mg/dL (ref 2.5–4.6)
Potassium: 3.4 mmol/L — ABNORMAL LOW (ref 3.5–5.1)
Sodium: 142 mmol/L (ref 135–145)

## 2021-11-17 LAB — CBC
HCT: 31.1 % — ABNORMAL LOW (ref 39.0–52.0)
Hemoglobin: 10 g/dL — ABNORMAL LOW (ref 13.0–17.0)
MCH: 32.3 pg (ref 26.0–34.0)
MCHC: 32.2 g/dL (ref 30.0–36.0)
MCV: 100.3 fL — ABNORMAL HIGH (ref 80.0–100.0)
Platelets: 148 10*3/uL — ABNORMAL LOW (ref 150–400)
RBC: 3.1 MIL/uL — ABNORMAL LOW (ref 4.22–5.81)
RDW: 16.7 % — ABNORMAL HIGH (ref 11.5–15.5)
WBC: 3.6 10*3/uL — ABNORMAL LOW (ref 4.0–10.5)
nRBC: 0 % (ref 0.0–0.2)

## 2021-11-17 LAB — SODIUM, URINE, RANDOM: Sodium, Ur: 47 mmol/L

## 2021-11-17 MED ORDER — CARVEDILOL 3.125 MG PO TABS
3.1250 mg | ORAL_TABLET | Freq: Two times a day (BID) | ORAL | Status: DC
  Administered 2021-11-17 (×2): 3.125 mg via ORAL
  Filled 2021-11-17 (×3): qty 1

## 2021-11-17 MED ORDER — SODIUM CHLORIDE 0.9 % IV SOLN: INTRAVENOUS | Status: DC

## 2021-11-17 MED ORDER — POTASSIUM CHLORIDE CRYS ER 20 MEQ PO TBCR
40.0000 meq | EXTENDED_RELEASE_TABLET | Freq: Once | ORAL | Status: AC
  Administered 2021-11-17: 40 meq via ORAL
  Filled 2021-11-17: qty 2

## 2021-11-17 NOTE — Progress Notes (Signed)
Mobility Specialist - Progress Note   11/17/21 1049  Mobility  HOB Elevated/Bed Position Self regulated  Activity Ambulated independently in hallway  Range of Motion/Exercises Active  Level of Assistance Independent after set-up  Assistive Device None  Distance Ambulated (ft) 150 ft  Activity Response Tolerated well  $Mobility charge 1 Mobility   Pt was found in bed and agreeable to mobilize. Had no complaints and at EOS stated his legs felt stiff. Returned to [employer] chair with all necessities in reach.  Ferd Hibbs Mobility Specialist

## 2021-11-17 NOTE — TOC Initial Note (Signed)
Transition of Care (TOC) - Initial/Assessment Note    Patient Details  Name: Charles Lloyd. MRN: [id number] Date of Birth: February 02, 1941  Transition of Care [facility]) CM/SW Contact:    Dessa Phi, RN Phone Number: 11/17/2021, 2:41 PM  Clinical Narrative: Spoke to patient about d/c plans-drives,has pcp,pharmacy. Continue to monitor for d/c needs.                  Expected Discharge Plan: Home/Self Care Barriers to Discharge: Continued Medical Work up   Patient Goals and CMS Choice Patient states their goals for this hospitalization and ongoing recovery are::  (Home) CMS Medicare.gov Compare Post Acute Care list provided to:: Patient Choice offered to / list presented to : NA  Expected Discharge Plan and Services Expected Discharge Plan: Home/Self Care   Discharge Planning Services: CM Consult   Living arrangements for the past 2 months: Single Family Home                                      Prior Living Arrangements/Services Living arrangements for the past 2 months: Single Family Home Lives with:: Self Patient language and need for interpreter reviewed:: Yes        Need for Family Participation in Patient Care: Yes (Comment) Care giver support system in place?: Yes (comment) Current home services:  (cane) Criminal Activity/Legal Involvement Pertinent to Current Situation/Hospitalization: No - Comment as needed  Activities of Daily Living Home Assistive Devices/Equipment: Walker (specify type), Cane (specify quad or straight) (going to have a stair lift put in, rolator, reading glasses) ADL Screening (condition at time of admission) Patient's cognitive ability adequate to safely complete daily activities?: Yes Is the patient deaf or have difficulty hearing?: Yes Does the patient have difficulty seeing, even when wearing glasses/contacts?: No Does the patient have difficulty concentrating, remembering, or making decisions?: No Patient able to express need for  assistance with ADLs?: Yes Does the patient have difficulty dressing or bathing?: No Independently performs ADLs?: Yes (appropriate for developmental age) Does the patient have difficulty walking or climbing stairs?: Yes Weakness of Legs: Both Weakness of Arms/Hands: None  Permission Sought/Granted Permission sought to share information with : Case Manager Permission granted to share information with : Yes, Verbal Permission Granted  Share Information with NAME:  (Case manager)           Emotional Assessment Appearance:: Appears stated age Attitude/Demeanor/Rapport: Gracious Affect (typically observed): Accepting Orientation: : Oriented to Self, Oriented to Place, Oriented to  Time, Oriented to Situation Alcohol / Substance Use: Not Applicable Psych Involvement: No (comment)  Admission diagnosis:  Uremia [N19] AKI (acute kidney injury) ([place]) [N17.9] Patient Active Problem List   Diagnosis Date Noted   Acute renal failure superimposed on stage 3b chronic kidney disease ([place]) 11/16/2021   Uremia 11/16/2021   Gout due to renal impairment 10/17/2021   Pancytopenia ([place]) 08/05/2021   SIRS (systemic inflammatory response syndrome) ([place]) 08/04/2021   Chronic kidney disease, stage 3b ([place]) 12/30/2020   Multiple falls    Spinal stenosis    S/P TKR (total knee replacement), bilateral    Arthralgia 11/20/2019   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   Lesion of liver 04/20/2019   Secondary hypercoagulable state ([place]) 04/09/2019   Eosinophil count raised 02/17/2019   Acute tracheobronchitis 01/05/2019   Asthma    Vitamin D deficiency 07/25/2018   Chronic anticoagulation 03/06/2018   Hyperlipidemia associated with type  2 diabetes mellitus ([place]) 01/28/2018   Basal cell carcinoma (BCC) of left side of nose 01/28/2018   Infected prosthetic knee joint ([place]) 06/24/2017   B12 deficiency 10/22/2016   Overweight (BMI 25.0-29.9) 10/22/2016   Primary osteoarthritis of right knee 03/29/2015    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   Paroxysmal A-fib (HCC) 04/16/2013   Chronic diastolic (congestive) heart failure ([place]) 04/16/2013   Colon polyps 04/16/2013   NIDDM-2 with polyneuropathy and hyperglycemia 04/16/2013   Tinea cruris 04/16/2013   Diabetic neuropathy ([place]) 07/14/2012   Insomnia 03/04/2012   Hypertensive heart disease with heart failure ([place]) 01/06/2010   Neoplasm of prostate, malignant ([place]) 01/06/2010   Hypercholesterolemia 01/06/2010   Essential hypertension 01/06/2010   PCP:  Isaac Bliss, [name], MD Pharmacy:   [facility] - [place], [place] [address] Phone: [phone] Fax: [phone]  [id number] [place] [place] [address]: [phone]: [phone] [facility] [place]Phone: [phone]: [phone]    Social Determinants of Health (SDOH) Interventions    Readmission Risk Interventions    08/08/2021   10:00 AM  Readmission Risk Prevention Plan  Transportation Screening Complete  Medication Review ([place] Complete  PCP or Specialist appointment within 3-5 days of discharge Complete  HRI or [place] Mate[place]Complete  SW Recovery Care/Counseling Consult Not Complete  [place] Applicable

## 2021-11-17 NOTE — Care Management Obs Status (Signed)
[facility] NOTIFICATION   Patient Details  Name: Charles Lloyd. MRN: [id number] Date of Birth: 05-08-1940   Medicare Observation Status Notification Given:  Yes    Dessa Phi, RN 11/17/2021, 2:44 PM

## 2021-11-17 NOTE — Progress Notes (Signed)
PROGRESS NOTE  [name]  XNT:[id number] DOB: June 16, 1940 DOA: 11/16/2021 PCP: Isaac Bliss, [name], MD   Brief Narrative:  Patient is a 81 year old male with history of pancytopenia, paroxysmal A-fib, hypertension, hyperlipidemia, diabetes type 2, congestive heart failure, prostate cancer, CKD stage IIIb, gout who presented with complaints of abnormal labs.  Patient complains of fatigue for 1 or 2 weeks before admission, had a fall at home.  At his PCPs office, lab work showed elevated BUN/creatinine.  Patient reported that he had doubled the dose of Lasix recently because of lower extremity edema.  On presentation, he was hemodynamically stable.  Lab work showed creatinine of 3.16.  Baseline creatinine of 1.6. BNP was normal.  Chest x-ray did not show any pneumonia.  CT renal study without any acute abnormality.  Patient was started on IV fluids.  Assessment & Plan:  Principal Problem:   Acute renal failure superimposed on stage 3b chronic kidney disease (HCC) Active Problems:   Paroxysmal A-fib (HCC)   Hypertensive heart disease with heart failure (HCC)   Hyperlipidemia associated with type 2 diabetes mellitus (HCC)   Chronic diastolic (congestive) heart failure (HCC)   Neoplasm of prostate, malignant (HCC)   NIDDM-2 with polyneuropathy and hyperglycemia   Hypercholesterolemia   Asthma   Essential hypertension   Uremia   AKI on CKD stage IIIb:lab work showed elevated BUN/creatinine.  Patient reported that he had doubled the dose of Lasix recently because of lower extremity edema.  On presentation, he was hemodynamically stable.  Lab work showed creatinine of 3.16.  Baseline creatinine of 1.6 .BNP was normal.  This is most likely from volume depletion.  He started on IV fluids, kidney function improving.  Monitor BMP. Home torsemide, valsartan , allopurinol, colchicine on hold. We will get urine sodium.  Lower extremity edema: Has bilateral lower extremity edema.  Will  try compression stockings.  Patient advised to elevate the legs while in bed  Chronic diastolic CHF: Echo on June of this year showed EF of 50%, indeterminate diastolic dysfunction.  BNP normal.  Continue IV fluids for now.  Home torsemide, valsartan on hold  Paroxysmal A-fib: Currently in normal sinus rhythm.  Continue home carvedilol, flecainide, Eliquis  Hypertension: Continue home carvedilol.  Torsemide, valsartan on hold  Hyperlipidemia: Continue Lipitor  Diabetes type 2: Continue sliding scale insulin.  Monitor blood sugars  History of asthma: Currently not in exacerbation.  Continue home Breo, Singulair, as needed albuterol  Gout: Takes allopurinol, consider at home, currently on hold  Pancytopenia: Chronic history of pancytopenia.  Currently hemoglobin, platelets stable,in comparison to his previous lab works..  Slight decrement could be from hemodilution.  We recommend to follow-up with hematology as an outpatient          DVT prophylaxis: apixaban (ELIQUIS) tablet 5 mg     Code Status: Full Code  Family Communication: Son at bedside  Patient status:Inpatient  Patient is from :Home  Anticipated discharge HQ:[facility]  Estimated DC date:1-2 days, awaiting further improvement in the kidney function   Consultants: None  Procedures: None  Antimicrobials:  Anti-infectives (From admission, onward)    None       Subjective:  Patient seen and examined at the bedside today.  Hemodynamically stable.  Looks comfortable, lying in bed.  Weakness has significantly improved today.  He still has bilateral lower extremity edema but intravascularly he might have been volume depleted.  Objective: Vitals:   11/16/21 2141 11/16/21 2328 11/17/21 0423 11/17/21 0737  BP:  128/84 139/78 105/60 108/64  Pulse: 75 77 67 66  Resp: 18   15  Temp: 97.9 F (36.6 C) 97.6 F (36.4 C) 98.6 F (37 C) 97.8 F (36.6 C)  TempSrc: Oral Oral Oral Oral  SpO2: 100% 100% 98% 99%  Weight:    78.5 kg   Height:        Intake/Output Summary (Last 24 hours) at 11/17/2021 0745 Last data filed at 11/17/2021 0427 Gross per 24 hour  Intake 1000 ml  Output 600 ml  Net 400 ml   Filed Weights   11/16/21 1800 11/17/21 0423  Weight: 79.5 kg 78.5 kg    Examination:  General exam: Overall comfortable, not in distress HEENT: PERRL Respiratory system:  no wheezes or crackles  Cardiovascular system: S1 & S2 heard, RRR.  Gastrointestinal system: Abdomen is nondistended, soft and nontender. Central nervous system: Alert and oriented Extremities: 1-2+ bilateral pitting LE edema, no clubbing ,no cyanosis Skin: No rashes, no ulcers,no icterus     Data Reviewed: I have personally reviewed following labs and imaging studies  CBC: Recent Labs  Lab 11/16/21 1614 11/17/21 0446  WBC 5.5 3.6*  NEUTROABS 3.8  --   HGB 10.8* 10.0*  HCT 33.1* 31.1*  MCV 100.0 100.3*  PLT 163 154*   Basic Metabolic Panel: Recent Labs  Lab 11/15/21 1526 11/16/21 1614 11/16/21 1645 11/17/21 0446  NA 137 138  --  142  K 4.0 3.9  --  3.4*  CL 105 110  --  114*  CO2 22 21*  --  21*  GLUCOSE 142* 124*  --  127*  BUN 131* 138*  --  112*  CREATININE 3.50* 3.16*  --  2.38*  CALCIUM 8.7 8.8*  --  8.4*  MG 2.1  --  2.2  --   PHOS  --   --   --  4.3     No results found for this or any previous visit (from the past 240 hour(s)).   Radiology Studies: CT Head Wo Contrast  Result Date: 11/16/2021 CLINICAL DATA:  Head injury after fall yesterday. EXAM: CT HEAD WITHOUT CONTRAST TECHNIQUE: Contiguous axial images were obtained from the base of the skull through the vertex without intravenous contrast. RADIATION DOSE REDUCTION: This exam was performed according to the departmental dose-optimization program which includes automated exposure control, adjustment of the mA and/or kV according to patient size and/or use of iterative reconstruction technique. COMPARISON:  December 30, 2020. FINDINGS: Brain: Mild  diffuse cortical atrophy. No mass effect or midline shift is noted. Ventricular size is within normal limits. There is no evidence of mass lesion, hemorrhage or acute infarction. Vascular: No hyperdense vessel or unexpected calcification. Skull: Normal. Negative for fracture or focal lesion. Sinuses/Orbits: No acute finding. Other: None. IMPRESSION: No acute intracranial abnormality seen. Electronically Signed   By: Marijo Conception M.D.   On: 11/16/2021 19:03   CT Renal Stone Study  Result Date: 11/16/2021 CLINICAL DATA:  [place] yesterday, weakness, renal insufficiency, flank pain EXAM: CT ABDOMEN AND PELVIS WITHOUT CONTRAST TECHNIQUE: Multidetector CT imaging of the abdomen and pelvis was performed following the standard protocol without IV contrast. Unenhanced CT was performed per clinician order. Lack of IV contrast limits sensitivity and specificity, especially for evaluation of abdominal/pelvic solid viscera. RADIATION DOSE REDUCTION: This exam was performed according to the departmental dose-optimization program which includes automated exposure control, adjustment of the mA and/or kV according to patient size and/or use of iterative reconstruction technique. COMPARISON:  None Available. FINDINGS: Lower chest: No acute pleural or parenchymal lung disease. Hepatobiliary: Numerous subcentimeter hypodensities throughout the liver too small to characterize. Evaluation of the liver parenchyma is limited without IV contrast. No biliary duct dilation. Cholecystectomy. Pancreas: Unremarkable unenhanced appearance. Spleen: Unremarkable unenhanced appearance. Adrenals/Urinary Tract: No urinary tract calculi or obstructive uropathy within either kidney. Grossly unremarkable unenhanced appearance of the kidneys. The adrenals and bladder are unremarkable. Stomach/Bowel: No bowel obstruction or ileus. Normal appendix right lower quadrant. Colonic diverticulosis without diverticulitis. No bowel wall thickening or  inflammatory change. Vascular/Lymphatic: Aortic atherosclerosis. No enlarged abdominal or pelvic lymph nodes. Reproductive: Postsurgical changes from prostatectomy. Other: No free fluid or free intraperitoneal gas. No abdominal wall hernia. Musculoskeletal: Subacute healing right anterolateral seventh through ninth rib fractures. No acute displaced fracture. Diffuse lumbar spondylosis. Reconstructed images demonstrate no additional findings. IMPRESSION: 1. Multiple subcentimeter hypodensities throughout the liver, too small to characterize. Some of these were visible on a prior CT chest 03/09/2019, and likely reflect small cysts or hemangiomas. 2. No acute intra-abdominal or intrapelvic process. 3. Colonic diverticulosis without diverticulitis. 4.  Aortic Atherosclerosis (ICD10-I70.0). Electronically Signed   By: Randa Ngo M.D.   On: 11/16/2021 19:03   DG Chest Portable 1 View  Result Date: 11/16/2021 CLINICAL DATA:  Screening, fatigue, left-sided chest pain EXAM: PORTABLE CHEST 1 VIEW COMPARISON:  Radiograph 08/11/2021 FINDINGS: The cardiomediastinal silhouette is within normal limits. There is no focal airspace consolidation. There is no pleural effusion. No pneumothorax. No acute osseous abnormality. Bilateral shoulder degenerative changes. Thoracic spondylosis. IMPRESSION: No evidence of acute cardiopulmonary disease. Electronically Signed   By: Maurine Simmering M.D.   On: 11/16/2021 17:34    Scheduled Meds:  apixaban  5 mg Oral BID   atorvastatin  20 mg Oral Daily   carvedilol  12.5 mg Oral BID   flecainide  100 mg Oral BID   fluticasone furoate-vilanterol  1 puff Inhalation Daily   gabapentin  600 mg Oral QHS   montelukast  10 mg Oral QHS   sodium chloride flush  3 mL Intravenous Q12H   Continuous Infusions:   LOS: 0 days   Shelly Coss, MD [facility] P9/22/2023, 7:45 AM

## 2021-11-17 NOTE — Plan of Care (Signed)
  Problem: Clinical Measurements: Goal: Will remain free from infection Outcome: Progressing Goal: Diagnostic test results will improve Outcome: Progressing Goal: Respiratory complications will improve Outcome: Progressing   Problem: Activity: Goal: Risk for activity intolerance will decrease Outcome: Progressing   

## 2021-11-18 DIAGNOSIS — N179 Acute kidney failure, unspecified: Secondary | ICD-10-CM | POA: Diagnosis not present

## 2021-11-18 DIAGNOSIS — N1832 Chronic kidney disease, stage 3b: Secondary | ICD-10-CM | POA: Diagnosis not present

## 2021-11-18 LAB — BASIC METABOLIC PANEL
Anion gap: 5 (ref 5–15)
BUN: 71 mg/dL — ABNORMAL HIGH (ref 8–23)
CO2: 20 mmol/L — ABNORMAL LOW (ref 22–32)
Calcium: 8.4 mg/dL — ABNORMAL LOW (ref 8.9–10.3)
Chloride: 121 mmol/L — ABNORMAL HIGH (ref 98–111)
Creatinine, Ser: 1.77 mg/dL — ABNORMAL HIGH (ref 0.61–1.24)
GFR, Estimated: 39 mL/min — ABNORMAL LOW (ref >60)
Glucose, Bld: 128 mg/dL — ABNORMAL HIGH (ref 70–99)
Potassium: 3.8 mmol/L (ref 3.5–5.1)
Sodium: 146 mmol/L — ABNORMAL HIGH (ref 135–145)

## 2021-11-18 MED ORDER — TORSEMIDE 20 MG PO TABS: 20.0000 mg | ORAL_TABLET | Freq: Every day | ORAL | 3 refills | Status: DC

## 2021-11-18 MED ORDER — CARVEDILOL 3.125 MG PO TABS: 3.1250 mg | ORAL_TABLET | Freq: Two times a day (BID) | ORAL | 0 refills | Status: DC

## 2021-11-18 NOTE — Discharge Summary (Signed)
Physician Discharge Summary  Charles Lloyd. DUK:[id number] DOB: 01-03-1941 DOA: 11/16/2021  PCP: Isaac Bliss, [name], MD  Admit date: 11/16/2021 Discharge date: 11/18/2021  Admitted From: Home Disposition:  Home  Discharge Condition:Stable CODE STATUS:FULL Diet recommendation:Carb Modified  Brief/Interim Summary:  Patient is a 81 year old male with history of pancytopenia, paroxysmal A-fib, hypertension, hyperlipidemia, diabetes type 2, congestive heart failure, prostate cancer, CKD stage IIIb, gout who presented with complaints of abnormal labs.  Patient complains of fatigue for 1 or 2 weeks before admission, had a fall at home.  At his PCPs office, lab work showed elevated BUN/creatinine.  Patient reported that he had doubled the dose of torsemide  recently because of lower extremity edema.  On presentation, he was hemodynamically stable.  Lab work showed creatinine of 3.16.  Baseline creatinine of 1.6. BNP was normal.  Chest x-ray did not show any pneumonia.  CT renal study without any acute abnormality.  Patient was started on IV fluids.  Kidney function has improved and is currently near baseline.  He is medically stable for discharge home today.  Following problems were addressed during his hospitalization:   AKI on CKD stage IIIb:Lab work showed elevated BUN/creatinine.  Patient reported that he had doubled the dose of Lasix recently because of lower extremity edema.  On presentation, he was hemodynamically stable.  Lab work showed creatinine of 3.16.  Baseline creatinine of 1.6 .BNP was normal.  This was most likely from volume depletion.His BP was also noted to be soft.  He started on IV fluids, kidney function improved Check BMP in a week   Lower extremity edema: Has bilateral lower extremity edema.  Will try compression stockings.  Patient advised to elevate the legs while in bed   Chronic diastolic CHF: Echo on June of this year showed EF of 50%, indeterminate diastolic  dysfunction.  BNP normal.  Can resume torsemide at 20 mg daily from Monday   Paroxysmal A-fib: Currently in normal sinus rhythm.  Continue home carvedilol with decreased dose, flecainide, Eliquis   Hypertension: Continue home carvedilol.  Valsartan will be stopped   Hyperlipidemia: Continue Lipitor   Diabetes type 2: Continue home regimen   History of asthma: Currently not in exacerbation.  Continue home Breo, Singulair   Gout : Was on allopurinol   Pancytopenia: Chronic history of pancytopenia.  Currently hemoglobin, platelets stable,in comparison to his previous lab works..  Slight decrement could be from hemodilution.  We recommend to follow-up with hematology as an outpatient   Discharge Diagnoses:  Principal Problem:   Acute renal failure superimposed on stage 3b chronic kidney disease ([place]) Active Problems:   Paroxysmal A-fib ([place])   Hypertensive heart disease with heart failure (HCC)   Hyperlipidemia associated with type 2 diabetes mellitus (HCC)   Chronic diastolic (congestive) heart failure (HCC)   Neoplasm of prostate, malignant (HCC)   NIDDM-2 with polyneuropathy and hyperglycemia   Hypercholesterolemia   Asthma   Essential hypertension   Uremia    Discharge Instructions  Discharge Instructions     Diet Carb Modified   Complete by: As directed    Discharge instructions   Complete by: As directed    1)Please take your medications as instructed 2)Monitor your blood pressure at home.  Your blood pressure has been low normal even without medications.  We have reduced the dose of carvedilol, discontinued valsartan.  Follow-up with your PCP in a week 3) start taking torsemide only from Monday.  Do a BMP test to check  your kidney function by following with your PCP   Increase activity slowly   Complete by: As directed       Allergies as of 11/18/2021   No Known Allergies      Medication List     STOP taking these medications    valsartan 160 MG  tablet Commonly known as: DIOVAN       TAKE these medications    Accu-Chek Aviva Plus test strip Generic drug: glucose blood 1 EACH BY OTHER ROUTE DAILY. DX E11.9   albuterol 108 (90 Base) MCG/ACT inhaler Commonly known as: VENTOLIN HFA Inhale 1-2 puffs into the lungs every 6 (six) hours as needed for wheezing or shortness of breath.   allopurinol 300 MG tablet Commonly known as: ZYLOPRIM Take 1 tablet (300 mg total) by mouth daily.   atorvastatin 20 MG tablet Commonly known as: LIPITOR TAKE 1 TABLET DAILY   carvedilol 3.125 MG tablet Commonly known as: COREG Take 1 tablet (3.125 mg total) by mouth 2 (two) times daily. What changed:  medication strength how much to take   Eliquis 5 MG Tabs tablet Generic drug: apixaban TAKE 1 TABLET TWICE A DAY What changed: how much to take   flecainide 100 MG tablet Commonly known as: TAMBOCOR TAKE 1 TABLET BY MOUTH TWICE A DAY   fluticasone 50 MCG/ACT nasal spray Commonly known as: FLONASE Place 2 sprays into both nostrils daily. What changed:  when to take this reasons to take this   fluticasone furoate-vilanterol 100-25 MCG/INH Aepb Commonly known as: Breo Ellipta Inhale 1 puff into the lungs daily. What changed:  when to take this reasons to take this   FreeStyle Libre 2 [facility] 1 each by Does not apply route daily.   FreeStyle Libre 2 Sensor Misc 1 each by Does not apply route daily.   gabapentin 300 MG capsule Commonly known as: NEURONTIN TAKE 2 CAPSULES BY MOUTH AT BEDTIME   Klor-Con M20 20 MEQ tablet Generic drug: potassium chloride SA TAKE 1 TABLET BY MOUTH EVERY DAY   Lantus SoloStar 100 UNIT/ML Solostar Pen Generic drug: insulin glargine Inject 8 Units into the skin daily.   montelukast 10 MG tablet Commonly known as: SINGULAIR Take 10 mg by mouth at bedtime.   Restasis 0.05 % ophthalmic emulsion Generic drug: cycloSPORINE Place 1 drop into both eyes 2 (two) times daily as needed (dry  eyes).   torsemide 20 MG tablet Commonly known as: DEMADEX Take 1 tablet (20 mg total) by mouth daily. Start taking from Monday only What changed:  when to take this additional instructions   triamcinolone cream 0.1 % Commonly known as: KENALOG Apply 1 application. topically 2 (two) times daily.        Follow-up Information     Isaac Bliss, [name], MD. Schedule an appointment as soon as possible for a visit in 1 week(s).   Specialty: Internal Medicine Contact information: [place] [address] [phone]                No Known Allergies  Consultations:    Procedures/Studies: CT Head Wo Contrast  Result Date: 11/16/2021 CLINICAL DATA:  Head injury after fall yesterday. EXAM: CT HEAD WITHOUT CONTRAST TECHNIQUE: Contiguous axial images were obtained from the base of the skull through the vertex without intravenous contrast. RADIATION DOSE REDUCTION: This exam was performed according to the departmental dose-optimization program which includes automated exposure control, adjustment of the mA and/or kV according to patient size and/or  use of iterative reconstruction technique. COMPARISON:  December 30, 2020. FINDINGS: Brain: Mild diffuse cortical atrophy. No mass effect or midline shift is noted. Ventricular size is within normal limits. There is no evidence of mass lesion, hemorrhage or acute infarction. Vascular: No hyperdense vessel or unexpected calcification. Skull: Normal. Negative for fracture or focal lesion. Sinuses/Orbits: No acute finding. Other: None. IMPRESSION: No acute intracranial abnormality seen. Electronically Signed   By: Marijo Conception M.D.   On: 11/16/2021 19:03   CT Renal Stone Study  Result Date: 11/16/2021 CLINICAL DATA:  [place] yesterday, weakness, renal insufficiency, flank pain EXAM: CT ABDOMEN AND PELVIS WITHOUT CONTRAST TECHNIQUE: Multidetector CT imaging of the abdomen and pelvis was performed following the  standard protocol without IV contrast. Unenhanced CT was performed per clinician order. Lack of IV contrast limits sensitivity and specificity, especially for evaluation of abdominal/pelvic solid viscera. RADIATION DOSE REDUCTION: This exam was performed according to the departmental dose-optimization program which includes automated exposure control, adjustment of the mA and/or kV according to patient size and/or use of iterative reconstruction technique. COMPARISON:  None Available. FINDINGS: Lower chest: No acute pleural or parenchymal lung disease. Hepatobiliary: Numerous subcentimeter hypodensities throughout the liver too small to characterize. Evaluation of the liver parenchyma is limited without IV contrast. No biliary duct dilation. Cholecystectomy. Pancreas: Unremarkable unenhanced appearance. Spleen: Unremarkable unenhanced appearance. Adrenals/Urinary Tract: No urinary tract calculi or obstructive uropathy within either kidney. Grossly unremarkable unenhanced appearance of the kidneys. The adrenals and bladder are unremarkable. Stomach/Bowel: No bowel obstruction or ileus. Normal appendix right lower quadrant. Colonic diverticulosis without diverticulitis. No bowel wall thickening or inflammatory change. Vascular/Lymphatic: Aortic atherosclerosis. No enlarged abdominal or pelvic lymph nodes. Reproductive: Postsurgical changes from prostatectomy. Other: No free fluid or free intraperitoneal gas. No abdominal wall hernia. Musculoskeletal: Subacute healing right anterolateral seventh through ninth rib fractures. No acute displaced fracture. Diffuse lumbar spondylosis. Reconstructed images demonstrate no additional findings. IMPRESSION: 1. Multiple subcentimeter hypodensities throughout the liver, too small to characterize. Some of these were visible on a prior CT chest 03/09/2019, and likely reflect small cysts or hemangiomas. 2. No acute intra-abdominal or intrapelvic process. 3. Colonic diverticulosis  without diverticulitis. 4.  Aortic Atherosclerosis (ICD10-I70.0). Electronically Signed   By: Randa Ngo M.D.   On: 11/16/2021 19:03   DG Chest Portable 1 View  Result Date: 11/16/2021 CLINICAL DATA:  Screening, fatigue, left-sided chest pain EXAM: PORTABLE CHEST 1 VIEW COMPARISON:  Radiograph 08/11/2021 FINDINGS: The cardiomediastinal silhouette is within normal limits. There is no focal airspace consolidation. There is no pleural effusion. No pneumothorax. No acute osseous abnormality. Bilateral shoulder degenerative changes. Thoracic spondylosis. IMPRESSION: No evidence of acute cardiopulmonary disease. Electronically Signed   By: Maurine Simmering M.D.   On: 11/16/2021 17:34      Subjective: Patient seen and examined at the bedside today.  Hemodynamically stable for discharge.  I called his son and discussed about the discharge plan  Discharge Exam: Vitals:   11/18/21 0525 11/18/21 0914  BP: 134/76 (!) 102/57  Pulse: 73 77  Resp: 18   Temp: (!) 97.3 F (36.3 C)   SpO2: 99%    Vitals:   11/17/21 2053 11/17/21 2053 11/18/21 0525 11/18/21 0914  BP: (!) 119/59 (!) 119/59 134/76 (!) 102/57  Pulse: 64 64 73 77  Resp:  18 18   Temp:  97.9 F (36.6 C) (!) 97.3 F (36.3 C)   TempSrc:  Oral Oral   SpO2:  99% 99%   Weight:  Height:        General: Pt is alert, awake, not in acute distress Cardiovascular: RRR, S1/S2 +, no rubs, no gallops Respiratory: CTA bilaterally, no wheezing, no rhonchi Abdominal: Soft, NT, ND, bowel sounds + Extremities: trace bilateral lower extremity edema, no cyanosis    The results of significant diagnostics from this hospitalization (including imaging, microbiology, ancillary and laboratory) are listed below for reference.     Microbiology: No results found for this or any previous visit (from the past 240 hour(s)).   Labs: BNP (last 3 results) Recent Labs    08/11/21 1451 11/16/21 1614  BNP 478.5* 76.5   Basic Metabolic Panel: Recent  Labs  Lab 11/15/21 1526 11/16/21 1614 11/16/21 1645 11/17/21 0446 11/18/21 0515  NA 137 138  --  142 146*  K 4.0 3.9  --  3.4* 3.8  CL 105 110  --  114* 121*  CO2 22 21*  --  21* 20*  GLUCOSE 142* 124*  --  127* 128*  BUN 131* 138*  --  112* 71*  CREATININE 3.50* 3.16*  --  2.38* 1.77*  CALCIUM 8.7 8.8*  --  8.4* 8.4*  MG 2.1  --  2.2  --   --   PHOS  --   --   --  4.3  --    Liver Function Tests: Recent Labs  Lab 11/15/21 1526 11/16/21 1614 11/17/21 0446  AST 39* 37  --   ALT 35 37  --   ALKPHOS 88 71  --   BILITOT 0.8 0.9  --   PROT 5.7* 5.6*  --   ALBUMIN 3.1* 2.9* 2.6*   No results for input(s): "LIPASE", "AMYLASE" in the last 168 hours. No results for input(s): "AMMONIA" in the last 168 hours. CBC: Recent Labs  Lab 11/16/21 1614 11/17/21 0446  WBC 5.5 3.6*  NEUTROABS 3.8  --   HGB 10.8* 10.0*  HCT 33.1* 31.1*  MCV 100.0 100.3*  PLT 163 148*   Cardiac Enzymes: Recent Labs  Lab 11/16/21 1645  CKTOTAL 105   BNP: Invalid input(s): "POCBNP" CBG: No results for input(s): "GLUCAP" in the last 168 hours. D-Dimer No results for input(s): "DDIMER" in the last 72 hours. Hgb A1c No results for input(s): "HGBA1C" in the last 72 hours. Lipid Profile No results for input(s): "CHOL", "HDL", "LDLCALC", "TRIG", "CHOLHDL", "LDLDIRECT" in the last 72 hours. Thyroid function studies Recent Labs    11/16/21 1701  TSH 1.234   Anemia work up No results for input(s): "VITAMINB12", "FOLATE", "FERRITIN", "TIBC", "IRON", "RETICCTPCT" in the last 72 hours. Urinalysis    Component Value Date/Time   COLORURINE STRAW (A) 11/16/2021 1618   APPEARANCEUR CLEAR 11/16/2021 1618   LABSPEC 1.009 11/16/2021 1618   PHURINE 5.0 11/16/2021 1618   GLUCOSEU NEGATIVE 11/16/2021 1618   HGBUR NEGATIVE 11/16/2021 1618   BILIRUBINUR NEGATIVE 11/16/2021 1618   KETONESUR NEGATIVE 11/16/2021 1618   PROTEINUR NEGATIVE 11/16/2021 1618   NITRITE NEGATIVE 11/16/2021 1618   LEUKOCYTESUR  NEGATIVE 11/16/2021 1618   Sepsis Labs Recent Labs  Lab 11/16/21 1614 11/17/21 0446  WBC 5.5 3.6*   Microbiology No results found for this or any previous visit (from the past 240 hour(s)).  Please note: You were cared for by a hospitalist during your hospital stay. Once you are discharged, your primary care physician will handle any further medical issues. Please note that NO REFILLS for any discharge medications will be authorized once you are discharged, as it is imperative  that you return to your primary care physician (or establish a relationship with a primary care physician if you do not have one) for your post hospital discharge needs so that they can reassess your need for medications and monitor your lab values.    Time coordinating discharge: 40 minutes  SIGNED:   Shelly Coss, MD  [facility] 11/18/2021, 10:25 AM Pager [phone]  If 7PM-7AM, please contact night-coverage [email or web address] Password [email or web address]

## 2021-11-18 NOTE — Plan of Care (Signed)
  Problem: Education: Goal: Knowledge of General Education information will improve Description: Including pain rating scale, medication(s)/side effects and non-pharmacologic comfort measures Outcome: Progressing   Problem: Clinical Measurements: Goal: Ability to maintain clinical measurements within normal limits will improve Outcome: Adequate for Discharge   Problem: Safety: Goal: Ability to remain free from injury will improve Outcome: Adequate for Discharge

## 2021-11-20 ENCOUNTER — Other Ambulatory Visit: Payer: Self-pay | Admitting: Internal Medicine

## 2021-11-20 ENCOUNTER — Telehealth: Payer: Self-pay | Admitting: Cardiology

## 2021-11-20 ENCOUNTER — Other Ambulatory Visit: Payer: Self-pay

## 2021-11-20 DIAGNOSIS — R0602 Shortness of breath: Secondary | ICD-10-CM

## 2021-11-20 NOTE — Telephone Encounter (Signed)
*  STAT* If patient is at the pharmacy, call can be transferred to refill team.   1. Which medications need to be refilled? (please list name of each medication and dose if known) Klor-Con  2. Which pharmacy/location (including street and city if local pharmacy) is medication to be sent to? [facility] [address], [place]  3. Do they need a 30 day or 90 day supply? 90 days and refills

## 2021-11-20 NOTE — Telephone Encounter (Signed)
Spoke with pt about KlorCon refill. PCP Estela Isaac Bliss Had already refilled medication today. Encouraged pt to call back if his refill was not completed as stated on his medication list. He agreed and verbalized understanding.

## 2021-11-25 ENCOUNTER — Ambulatory Visit (HOSPITAL_BASED_OUTPATIENT_CLINIC_OR_DEPARTMENT_OTHER): Payer: Medicare Other | Admitting: Physical Therapy

## 2021-11-29 ENCOUNTER — Encounter (HOSPITAL_BASED_OUTPATIENT_CLINIC_OR_DEPARTMENT_OTHER): Payer: Self-pay | Admitting: Physical Therapy

## 2021-11-29 ENCOUNTER — Ambulatory Visit (HOSPITAL_BASED_OUTPATIENT_CLINIC_OR_DEPARTMENT_OTHER): Payer: Medicare Other | Attending: Pain Medicine | Admitting: Physical Therapy

## 2021-11-29 DIAGNOSIS — M6281 Muscle weakness (generalized): Secondary | ICD-10-CM | POA: Diagnosis not present

## 2021-11-29 DIAGNOSIS — M48062 Spinal stenosis, lumbar region with neurogenic claudication: Secondary | ICD-10-CM | POA: Diagnosis not present

## 2021-11-29 DIAGNOSIS — M2681 Anterior soft tissue impingement: Secondary | ICD-10-CM | POA: Diagnosis not present

## 2021-11-29 DIAGNOSIS — R296 Repeated falls: Secondary | ICD-10-CM | POA: Insufficient documentation

## 2021-11-29 DIAGNOSIS — M5459 Other low back pain: Secondary | ICD-10-CM | POA: Insufficient documentation

## 2021-11-29 DIAGNOSIS — R2681 Unsteadiness on feet: Secondary | ICD-10-CM | POA: Diagnosis present

## 2021-11-29 NOTE — Therapy (Signed)
OUTPATIENT PHYSICAL THERAPY THORACOLUMBAR EVALUATION   Patient Name: Charles Lloyd. MRN: [id number] DOB:09-21-1940, 81 y.o., male Today's Date: 11/29/2021   PT End of Session - 11/29/21 1400     Visit Number 1    Number of Visits 21    Date for PT Re-Evaluation 02/07/22    Authorization Type [employer] MCR    Authorization Time Period 11/29/21 to 02/07/22    Progress Note Due on Visit 10    PT Start Time 1302    PT Stop Time 1342    PT Time Calculation (min) 40 min    Activity Tolerance Patient tolerated treatment well    Behavior During Therapy [facility] for tasks assessed/performed             Past Medical History:  Diagnosis Date   Acute tracheobronchitis 01/05/2019   B12 deficiency    Basal cell carcinoma (BCC) of left side of nose 01/28/2018   Cancer ([place])    Cardiomyopathy ([place]) 03/10/2018   With chronic atrial fibrillation   CHF (congestive heart failure) ([place]) 04/16/2013   Functional class II, ejection fraction 35-40%  Formatting of this note might be different from the original. Functional class II, ejection fraction 35-40%   Chronic anticoagulation    Chronic diastolic (congestive) heart failure ([place]) 04/16/2013   Functional class II, ejection fraction 35-40%  Last Assessment & Plan:  Clinically stable Volume well controlled meds reviewed   Chronic diastolic CHF (congestive heart failure) ([place]) 04/16/2013   Functional class II, ejection fraction 35-40%  Last Assessment & Plan:  Clinically stable Volume well controlled meds reviewed   Colon polyps 04/16/2013   Diabetic neuropathy (HCC)    Eosinophil count raised 02/17/2019   Essential hypertension 01/06/2010   Last Assessment & Plan:  Well controlled Continue med management   Gout    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  Steroid usage was discontinued several months ago.  Follow up with Dermatology as planned.   Hypercholesterolemia 01/06/2010   Last  Assessment & Plan:  Repeat labs recommended   Hyperlipidemia associated with type 2 diabetes mellitus ([place]) 01/28/2018   Hypertensive heart disease with heart failure ([place]) 01/06/2010   Last Assessment & Plan:  Well controlled Continue med management   Hyponatremia 12/17/2018   Infected prosthetic knee joint ([place]) 06/24/2017   Last Assessment & Plan:  Id following ?ongoing abx managementy reported Request records   Infection of prosthetic right knee joint ([place])    Insomnia 03/04/2012   Grief with loss of wife 02/20/12  Last Assessment & Plan:  Improved sx  contineu xanax prn Se discussed   Lesion of liver 04/20/2019   -On cardiac CT 02/2019   Mild reactive airways disease    Neoplasm of prostate 04/16/2013   Neoplasm of prostate, malignant ([place]) 01/06/2010   [name] S/p radiation therapy   Last Assessment & Plan:  Repeat psa today No urinary sx Pt reported fatigue/poor appetite   On amiodarone therapy 09/07/2013   On continuous oral anticoagulation 01/28/2018   Other activity(E029.9) 04/20/2013   Formatting of this note might be different from the original. Transthoracic Echocardiogram-03/10/2013-[facility]: Normal left ventricular wall thickness and cavity size.  Global left ventricular systolic function is moderately reduced.  The estimated ejection fraction is 35-40%.  The left atrium is moderately enlarged.  The right atrium is mildly enlarged.  No significant valvular    Overweight (BMI 25.0-29.9) 10/22/2016   Persistent  atrial fibrillation ([place]) 04/16/2013   Last Assessment & Plan:  Rate controlled Continue med management eliquis for stroke preventino  Formatting of this note might be different from the original.  Drug  HX Current Rx Pre-ABL inefficacy Pre-ABL intolerant Post-ABL inefficacy Post-ABL intolerant max dose/24h M/Y end comments  sotalol                  dofetilide                  flecainide                  propafenone                  am   S/P TKR (total knee  replacement), bilateral    Secondary hypercoagulable state ([place]) 04/09/2019   Tinea cruris 04/16/2013   Type 2 diabetes mellitus with diabetic polyneuropathy, without long-term current use of insulin ([place]) 04/16/2013   Last Assessment & Plan:  Labs today Pt reports well controlled on ambulatory monitoring   Vitamin D deficiency 07/25/2018   Past Surgical History:  Procedure Laterality Date   ATRIAL FIBRILLATION ABLATION N/A 03/12/2019   Procedure: ATRIAL FIBRILLATION ABLATION;  Surgeon: [name], MD;  Location: [place] [facility];  Service: Cardiovascular;  Laterality: N/A;   ATRIAL FIBRILLATION ABLATION N/A 04/29/2020   Procedure: ATRIAL FIBRILLATION ABLATION;  Surgeon: [name], MD;  Location: [place] [facility];  Service: Cardiovascular;  Laterality: N/A;   CARDIAC ELECTROPHYSIOLOGY STUDY AND ABLATION     CARDIOVERSION     CARDIOVERSION N/A 10/10/2018   Procedure: CARDIOVERSION;  Surgeon: [name], MD;  Location: [facility] ENDOSCOPY;  Service: Cardiovascular;  Laterality: N/A;   CARDIOVERSION N/A 05/18/2020   Procedure: CARDIOVERSION;  Surgeon: [name], MD;  Location: [name];  Service: Cardiovascular;  Laterality: N/A;   CARDIOVERSION N/A 12/09/2020   Procedure: CARDIOVERSION;  Surgeon: Lelon Perla, MD;  Location: [facility] ENDOSCOPY;  Service: Cardiovascular;  Laterality: N/A;   CHOLECYSTECTOMY     PROSTATECTOMY     REPLACEMENT TOTAL KNEE BILATERAL     Patient Active Problem List   Diagnosis Date Noted   Acute renal failure superimposed on stage 3b chronic kidney disease ([place]) 11/16/2021   Uremia 11/16/2021   Gout due to renal impairment 10/17/2021   Pancytopenia ([place]) 08/05/2021   SIRS (systemic inflammatory response syndrome) ([place]) 08/04/2021   Chronic kidney disease, stage 3b ([place]) 12/30/2020   Multiple falls    Spinal stenosis    S/P TKR (total knee replacement), bilateral    Arthralgia 11/20/2019   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   Lesion  of liver 04/20/2019   Secondary hypercoagulable state ([place]) 04/09/2019   Eosinophil count raised 02/17/2019   Acute tracheobronchitis 01/05/2019   Asthma    Vitamin D deficiency 07/25/2018   Chronic anticoagulation 03/06/2018   Hyperlipidemia associated with type 2 diabetes mellitus ([place]) 01/28/2018   Basal cell carcinoma (BCC) of left side of nose 01/28/2018   Infected prosthetic knee joint ([place]) 06/24/2017   B12 deficiency 10/22/2016   Overweight (BMI 25.0-29.9) 10/22/2016   Primary osteoarthritis of right knee 03/29/2015   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   Paroxysmal A-fib ([place]) 04/16/2013   Chronic diastolic (congestive) heart failure ([place]) 04/16/2013   Colon polyps 04/16/2013   NIDDM-2 with polyneuropathy and hyperglycemia 04/16/2013   Tinea cruris 04/16/2013   Diabetic neuropathy ([place]) 07/14/2012   Insomnia 03/04/2012   Hypertensive heart disease with heart failure ([place]) 01/06/2010  Neoplasm of prostate, malignant ([place]) 01/06/2010   Hypercholesterolemia 01/06/2010   Essential hypertension 01/06/2010    PCP: Isaac Bliss, Olam Idler   REFERRING PROVIDER: Reece Agar, MD   REFERRING DIAG: [phone] (ICD-10-CM) - Spinal stenosis, lumbar region with neurogenic claudication   Rationale for Evaluation and Treatment Rehabilitation  THERAPY DIAG:  Unsteadiness on feet - Plan: PT plan of care cert/re-cert  Repeated falls - Plan: PT plan of care cert/re-cert  Muscle weakness (generalized) - Plan: PT plan of care cert/re-cert  Other low back pain - Plan: PT plan of care cert/re-cert  ONSET DATE: 81/44/8185   SUBJECTIVE:                                                                                                                                                                                           SUBJECTIVE STATEMENT:  Tyqwan arrives with his son, who tells me that Baley has had some falls, has severe neuropathy and has been falling a lot recently. Having a lot of  falls recently, the last fall caused a lot of bruising due to blood thinners. Son reveals that Johnanthony is reluctant to do therapy but family is very encouraging of this. He sits most of the day except for 45 minutes, very sedentary. Had a couple shots in his spine, trying to decrease numbness. Very weak in general, so weak that by the time I finish shaving I can't even hold the shaver anymore.   PERTINENT HISTORY:    PAIN:  Are you having pain? Yes: NPRS scale: 3/10 Pain location: knees down to ankles  Pain description: neuropathy  Aggravating factors: none  Relieving factors: injections    PRECAUTIONS: Fall and Other: severe neuropathy   WEIGHT BEARING RESTRICTIONS No  FALLS:  Has patient fallen in last 6 months? Yes. Number of falls 3.5 (caught himself on one fall, on the other 3 falls my legs just gave way like someone took my legs out from under me)  LIVING ENVIRONMENT: Lives with: lives alone and family is near by and can help him easily  Lives in: House/apartment Stairs:  two story townhouse with stair lift inside, no STE but does have to deal with one step going up to chair lift Has following equipment at home: Single point cane, Walker - 4 wheeled, and stair lift, raised toilet seats, shower stool   OCCUPATION: retired   PLOF: Independent, Independent with basic ADLs, and Requires assistive device for independence  PATIENT GOALS stop falling, try to gain some strength   OBJECTIVE:   DIAGNOSTIC FINDINGS:  IMPRESSION: 1. 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 2. Stable to slight  progression of severe spinal stenosis and moderate neural foraminal stenosis at L2-3. 3. Slight progression of moderate spinal stenosis at L3-4.    PATIENT SURVEYS:  FOTO will do second session  SCREENING FOR RED FLAGS: Bowel or bladder incontinence: No Spinal tumors: No Cauda equina syndrome: No Compression fracture: No Abdominal aneurysm: No  COGNITION:  Overall  cognitive status: Within functional limits for tasks assessed But impaired safety awareness in general     SENSATION: Hx of severe peripheral neuropathy stocking presentation from knees down, impaired LTT L>R, somewhat impaired deep pressure sense B    MUSCLE LENGTH:  POSTURE: rounded shoulders, forward head, decreased lumbar lordosis, increased thoracic kyphosis, and flexed trunk   PALPATION:   LUMBAR ROM:   Active  A/PROM  eval  Flexion   Extension   Right lateral flexion   Left lateral flexion   Right rotation   Left rotation    (Blank rows = not tested)  LOWER EXTREMITY ROM:     Active  Right eval Left eval  Hip flexion    Hip extension    Hip abduction    Hip adduction    Hip internal rotation    Hip external rotation    Knee flexion    Knee extension    Ankle dorsiflexion    Ankle plantarflexion    Ankle inversion    Ankle eversion     (Blank rows = not tested)  LOWER EXTREMITY MMT:    MMT Right eval Left eval  Hip flexion 3 3  Hip extension 3- 3-  Hip abduction 3 3  Hip adduction    Hip internal rotation    Hip external rotation    Knee flexion    Knee extension    Ankle dorsiflexion 3+ 3+  Ankle plantarflexion    Ankle inversion 4+ 4+  Ankle eversion 4+ 4+   (Blank rows = not tested)  Core strength very weak, needed cues for abdominal activation with dynamic sitting   UE MMT:  Flexion R 4+/5 L 4+/5 ABD R 4+/5 L 4+/5 Biceps 4/5 Triceps 4/5 Grip strength mild limitation B    LUMBAR SPECIAL TESTS:    FUNCTIONAL TESTS:  3 minute walk test: 429f with rollator no rest  Berg Balance Scale: 27/56  GAIT: Distance walked: 453f Assistive device utilized: WaEnvironmental consultant 4 wheeled Level of assistance: Modified independence Comments: flexed at hips, limited trunk rotation, B foot slap noted due to B dorsiflexor weakness     TODAY'S TREATMENT  Education as below    PATIENT EDUCATION:  Education details: exam findings, POC,  recommendation for floor exercise bike/method of progression, HEP to be given next session  Person educated: Patient and Child(ren) Education method: Explanation Education comprehension: verbalized understanding, returned demonstration, and needs further education   HOME EXERCISE PROGRAM: Will be given second session   ASSESSMENT:  CLINICAL IMPRESSION: Patient is a [age].o. M who was seen today for physical therapy evaluation and treatment for spinal stenosis. GeZayvionas accompanied by his son, who reveals that back pain is not so much of an issue as severe balance impairment, as there have been at least 3 major falls within the past 6 months one of which involved steps inside his home. Exam reveals significant impairment in functional mm strength, poor balance skills and recovery, impaired functional activity tolerance, poor safety awareness and high fall risk. Does have B swollen ankles (already seeing cardiologist), also B foot slap during gait which increases with fatigue. Will  benefit from skilled PT services to address impairments and reduce fall risk moving forward.    OBJECTIVE IMPAIRMENTS Abnormal gait, cardiopulmonary status limiting activity, decreased balance, decreased coordination, decreased knowledge of use of DME, decreased mobility, difficulty walking, decreased strength, decreased safety awareness, increased edema, and impaired UE functional use.   ACTIVITY LIMITATIONS standing, squatting, stairs, transfers, bed mobility, reach over head, and locomotion level  PARTICIPATION LIMITATIONS: shopping, community activity, and yard work  PERSONAL FACTORS Age, Behavior pattern, Fitness, Past/current experiences, and Time since onset of injury/illness/exacerbation are also affecting patient's functional outcome.   REHAB POTENTIAL: Good  CLINICAL DECISION MAKING: Evolving/moderate complexity  EVALUATION COMPLEXITY: Moderate   GOALS: Goals reviewed with patient? Yes  SHORT TERM  GOALS: Target date: 01/03/2022  Will be compliant with appropriate progressive HEP to include walking vs floor bike program  Baseline: Goal status: INITIAL  2.  Will be able to name 3 ways to reduce fall risk at home and in community  Baseline:  Goal status: INITIAL  3.  Will demonstrate improved ankle dorsiflexor endurance and reduce related fall risk as evidenced by elimination of foot slap during 3MWT  Baseline:  Goal status: INITIAL  4. Will be independent with alternative ways to control edema in BLEs (such as spiral wrapping with ACE bandages) Baseline:  Goal status: INITIAL    LONG TERM GOALS: Target date: 02/07/2022  MMT to improve by at least 1 grade in all weak groups  Baseline:  Goal status: INITIAL  2.  Will be able to ambulate at least 512f safely in 3MWT with use of SPC vs SBQC with no more than S level assist to improve community access  Baseline:  Goal status: INITIAL  3.  Will score at least 37 on the Berg to show improved balance/reduced fall risk  Baseline:  Goal status: INITIAL  4.  Will demonstrate improved UE strength as evidenced by ability to lift rollator in/out of car for improved community access (for extended gait distances 10045f+) Baseline:  Goal status: INITIAL  5.  Will be compliant with advanced HEP vs gym program to maintain functional gains and prevent recurrence of impairments  Baseline:  Goal status: INITIAL     PLAN: PT FREQUENCY: 2x/week  PT DURATION: 10 weeks  PLANNED INTERVENTIONS: Therapeutic exercises, Therapeutic activity, Neuromuscular re-education, Balance training, Gait training, Patient/Family education, Self Care, Joint mobilization, Stair training, DME instructions, Aquatic Therapy, Taping, Ionotophoresis '4mg'$ /ml Dexamethasone, Manual therapy, and Re-evaluation.  PLAN FOR NEXT SESSION: focus on strength, balance, endurance; interested in gym machines so would probably enjoy incorporating this into sessions.  Strengthen ankle dorsiflexors, may need to consider AFOs in the future if gait/ankle strength does not improve significantly with PT    Silviano Neuser U PT DPT PN2  11/29/2021, 2:03 PM

## 2021-12-01 ENCOUNTER — Ambulatory Visit (HOSPITAL_BASED_OUTPATIENT_CLINIC_OR_DEPARTMENT_OTHER): Payer: Medicare Other | Admitting: Physical Therapy

## 2021-12-01 ENCOUNTER — Encounter (HOSPITAL_BASED_OUTPATIENT_CLINIC_OR_DEPARTMENT_OTHER): Payer: Self-pay | Admitting: Physical Therapy

## 2021-12-01 DIAGNOSIS — R2681 Unsteadiness on feet: Secondary | ICD-10-CM

## 2021-12-01 DIAGNOSIS — M48062 Spinal stenosis, lumbar region with neurogenic claudication: Secondary | ICD-10-CM | POA: Diagnosis not present

## 2021-12-01 DIAGNOSIS — R296 Repeated falls: Secondary | ICD-10-CM

## 2021-12-01 DIAGNOSIS — M6281 Muscle weakness (generalized): Secondary | ICD-10-CM

## 2021-12-01 DIAGNOSIS — M5459 Other low back pain: Secondary | ICD-10-CM

## 2021-12-01 NOTE — Therapy (Signed)
OUTPATIENT PHYSICAL THERAPY TREATMENT NOTE   Patient Name: Charles Lloyd. MRN: [id number] DOB:06/28/1940, 81 y.o., male Today's Date: 12/02/2021   PT End of Session - 12/01/21 1233     Visit Number 2    Number of Visits 21    Date for PT Re-Evaluation 02/07/22    Authorization Type [employer] MCR    Authorization Time Period 11/29/21 to 02/07/22    PT Start Time 1230    PT Stop Time 1320    PT Time Calculation (min) 50 min    Activity Tolerance Patient tolerated treatment well    Behavior During Therapy [facility] for tasks assessed/performed              Past Medical History:  Diagnosis Date   Acute tracheobronchitis 01/05/2019   B12 deficiency    Basal cell carcinoma (BCC) of left side of nose 01/28/2018   Cancer ([place])    Cardiomyopathy ([place]) 03/10/2018   With chronic atrial fibrillation   CHF (congestive heart failure) ([place]) 04/16/2013   Functional class II, ejection fraction 35-40%  Formatting of this note might be different from the original. Functional class II, ejection fraction 35-40%   Chronic anticoagulation    Chronic diastolic (congestive) heart failure ([place]) 04/16/2013   Functional class II, ejection fraction 35-40%  Last Assessment & Plan:  Clinically stable Volume well controlled meds reviewed   Chronic diastolic CHF (congestive heart failure) ([place]) 04/16/2013   Functional class II, ejection fraction 35-40%  Last Assessment & Plan:  Clinically stable Volume well controlled meds reviewed   Colon polyps 04/16/2013   Diabetic neuropathy (HCC)    Eosinophil count raised 02/17/2019   Essential hypertension 01/06/2010   Last Assessment & Plan:  Well controlled Continue med management   Gout    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  Steroid usage was discontinued several months ago.  Follow up with Dermatology as planned.   Hypercholesterolemia 01/06/2010   Last Assessment & Plan:  Repeat labs recommended    Hyperlipidemia associated with type 2 diabetes mellitus ([place]) 01/28/2018   Hypertensive heart disease with heart failure ([place]) 01/06/2010   Last Assessment & Plan:  Well controlled Continue med management   Hyponatremia 12/17/2018   Infected prosthetic knee joint ([place]) 06/24/2017   Last Assessment & Plan:  Id following ?ongoing abx managementy reported Request records   Infection of prosthetic right knee joint ([place])    Insomnia 03/04/2012   Grief with loss of wife 02/20/12  Last Assessment & Plan:  Improved sx  contineu xanax prn Se discussed   Lesion of liver 04/20/2019   -On cardiac CT 02/2019   Mild reactive airways disease    Neoplasm of prostate 04/16/2013   Neoplasm of prostate, malignant ([place]) 01/06/2010   [name] S/p radiation therapy   Last Assessment & Plan:  Repeat psa today No urinary sx Pt reported fatigue/poor appetite   On amiodarone therapy 09/07/2013   On continuous oral anticoagulation 01/28/2018   Other activity(E029.9) 04/20/2013   Formatting of this note might be different from the original. Transthoracic Echocardiogram-03/10/[facility]: Normal left ventricular wall thickness and cavity size.  Global left ventricular systolic function is moderately reduced.  The estimated ejection fraction is 35-40%.  The left atrium is moderately enlarged.  The right atrium is mildly enlarged.  No significant valvular    Overweight (BMI 25.0-29.9) 10/22/2016   Persistent atrial fibrillation ([place]) 04/16/2013   Last Assessment &  Plan:  Rate controlled Continue med management eliquis for stroke preventino  Formatting of this note might be different from the original.  Drug  HX Current Rx Pre-ABL inefficacy Pre-ABL intolerant Post-ABL inefficacy Post-ABL intolerant max dose/24h M/Y end comments  sotalol                  dofetilide                  flecainide                  propafenone                  am   S/P TKR (total knee replacement), bilateral    Secondary  hypercoagulable state ([place]) 04/09/2019   Tinea cruris 04/16/2013   Type 2 diabetes mellitus with diabetic polyneuropathy, without long-term current use of insulin ([place]) 04/16/2013   Last Assessment & Plan:  Labs today Pt reports well controlled on ambulatory monitoring   Vitamin D deficiency 07/25/2018   Past Surgical History:  Procedure Laterality Date   ATRIAL FIBRILLATION ABLATION N/A 03/12/2019   Procedure: ATRIAL FIBRILLATION ABLATION;  Surgeon: [name], MD;  Location: [place] [facility];  Service: Cardiovascular;  Laterality: N/A;   ATRIAL FIBRILLATION ABLATION N/A 04/29/2020   Procedure: ATRIAL FIBRILLATION ABLATION;  Surgeon: [name], MD;  Location: [place] [facility];  Service: Cardiovascular;  Laterality: N/A;   CARDIAC ELECTROPHYSIOLOGY STUDY AND ABLATION     CARDIOVERSION     CARDIOVERSION N/A 10/10/2018   Procedure: CARDIOVERSION;  Surgeon: [name], MD;  Location: [place];  Service: Cardiovascular;  Laterality: N/A;   CARDIOVERSION N/A 05/18/2020   Procedure: CARDIOVERSION;  Surgeon: [name], MD;  Location: [place];  Service: Cardiovascular;  Laterality: N/A;   CARDIOVERSION N/A 12/09/2020   Procedure: CARDIOVERSION;  Surgeon: Lelon Perla, MD;  Location[facility];  Service: Cardiovascular;  Laterality: N/A;   CHOLECYSTECTOMY     PROSTATECTOMY     REPLACEMENT TOTAL KNEE BILATERAL     Patient Active Problem List   Diagnosis Date Noted   Acute renal failure superimposed on stage 3b chronic kidney disease ([place]) 11/16/2021   Uremia 11/16/2021   Gout due to renal impairment 10/17/2021   Pancytopenia ([place]) 08/05/2021   SIRS (systemic inflammatory response syndrome) ([place]) 08/04/2021   Chronic kidney disease, stage 3b ([place]) 12/30/2020   Multiple falls    Spinal stenosis    S/P TKR (total knee replacement), bilateral    Arthralgia 11/20/2019   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   Lesion of liver 04/20/2019   Secondary  hypercoagulable state ([place]) 04/09/2019   Eosinophil count raised 02/17/2019   Acute tracheobronchitis 01/05/2019   Asthma    Vitamin D deficiency 07/25/2018   Chronic anticoagulation 03/06/2018   Hyperlipidemia associated with type 2 diabetes mellitus ([place]) 01/28/2018   Basal cell carcinoma (BCC) of left side of nose 01/28/2018   Infected prosthetic knee joint ([place]) 06/24/2017   B12 deficiency 10/22/2016   Overweight (BMI 25.0-29.9) 10/22/2016   Primary osteoarthritis of right knee 03/29/2015   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   Paroxysmal A-fib ([place]) 04/16/2013   Chronic diastolic (congestive) heart failure ([place]) 04/16/2013   Colon polyps 04/16/2013   NIDDM-2 with polyneuropathy and hyperglycemia 04/16/2013   Tinea cruris 04/16/2013   Diabetic neuropathy ([place]) 07/14/2012   Insomnia 03/04/2012   Hypertensive heart disease with heart failure ([place]) 01/06/2010   Neoplasm of prostate, malignant ([place]) 01/06/2010  Hypercholesterolemia 01/06/2010   Essential hypertension 01/06/2010    PCP: Isaac Bliss, Olam Idler   REFERRING PROVIDER: Reece Agar, MD   REFERRING DIAG: [phone] (ICD-10-CM) - Spinal stenosis, lumbar region with neurogenic claudication   Rationale for Evaluation and Treatment Rehabilitation  THERAPY DIAG:  Unsteadiness on feet  Repeated falls  Muscle weakness (generalized)  Other low back pain  ONSET DATE: 11/15/2021   SUBJECTIVE:                                                                                                                                                                                           SUBJECTIVE STATEMENT: Pt was in the hospital in June and a couple of times since.  Pt states he was dehydrated and had some falls.  Pt reports he became weak being in the hospital.  He is limited with ambulation and uses a cane.  He states he has weakness in his legs ambulating for 15-20 mins in the grocery store.  He states he has swelling in bilat  ankles.  Pt denies any adverse effects after prior Rx.   PERTINENT HISTORY:  Lumbar anterolisthesis, Peripheral neuropathy, TKR bilat, A-fib, CHF, DM  PAIN:  Are you having pain? Yes: NPRS scale: 3/10 Pain location: knees down to ankles  Pain description: neuropathy  Aggravating factors: none  Relieving factors: injections    PRECAUTIONS: Fall and Other: severe neuropathy   WEIGHT BEARING RESTRICTIONS No  FALLS:  Has patient fallen in last 6 months? Yes. Number of falls 3.5 (caught himself on one fall, on the other 3 falls my legs just gave way like someone took my legs out from under me)  LIVING ENVIRONMENT: Lives with: lives alone and family is near by and can help him easily  Lives in: House/apartment Stairs:  two story townhouse with stair lift inside, no STE but does have to deal with one step going up to chair lift Has following equipment at home: Single point cane, Walker - 4 wheeled, and stair lift, raised toilet seats, shower stool   OCCUPATION: retired   PLOF: Independent, Independent with basic ADLs, and Requires assistive device for independence  PATIENT GOALS stop falling, try to gain some strength   OBJECTIVE:   DIAGNOSTIC FINDINGS:  IMPRESSION: 1. 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 2. Stable to slight progression of severe spinal stenosis and moderate neural foraminal stenosis at L2-3. 3. Slight progression of moderate spinal stenosis at L3-4.    TODAY'S TREATMENT  PATIENT SURVEYS:  Tried to give FOTO though tablet would not bring it survey.   Therapeutic Exercise:   Pt was educated in correct  performance and palpation of TrA contraction. Pt performed: Ankle DF with T band with YTB 2x10 Supine TrA contraction with and without 5 sec hold Supine marching with TrA 2x5-6 Seated marching with GTB 2x10 Seated clams with GTB 2x10 Seated LAQ with RTB 2x10  Attempted standing heel raises though pt was very limited Seated heel raises  2x10      Neuro Re-ed Activities: Standing weight shifts with UE support s/s and f/b x 10 reps with SBA   Standing with FT without UE support 2x30 sec with SBA    Pt received a HEP handout and was educated in correct form and appropriate frequency.  Pt instructed he should not have pain with HEP.    PATIENT EDUCATION:  Education details: Pt received a HEP.  Exercise form, rationale of exercises, and POC.  Person educated: Patient and Child(ren) Education method: Explanation Education comprehension: verbalized understanding, returned demonstration, and needs further education   HOME EXERCISE PROGRAM: Access Code: [email or web address] URL: [email or web address] Date: 12/01/2021 Prepared by: Ronny Flurry  Exercises - Supine Transversus Abdominis Bracing - Hands on Stomach  - 2 x daily - 7 x weekly - 2 sets - 10 reps - Supine March  - 1-2 x daily - 7 x weekly - 2 sets - 5-10 reps - Seated Hip Abduction with Resistance  - 1 x daily - 4 x weekly - 2 sets - 10 reps - Seated Knee Extension with Resistance  - 1 x daily - 3-4 x weekly - 2 sets - 10 reps - Seated March  - 1 x daily - 3-4 x weekly - 2 sets - 10 reps - Seated Heel Raise  - 1 x daily - 7 x weekly - 2 sets - 10 reps  ASSESSMENT:  CLINICAL IMPRESSION: PT established HEP and gave pt a handout.  Pt performed exercises well with instruction and cuing for correct form and positioning.  Pt has weakness in bilat LE's.  Pt very limited with height of standing heel raises and included seated heel raises for HEP instead of standing heel raises.  Pt has swelling in bilat ankles and cardiologist has been changing dosage of meds.  He responded well to Rx and had no pain after Rx.  Pt should benefit from skilled PT services to address impairments and goals and to improve function.     OBJECTIVE IMPAIRMENTS Abnormal gait, cardiopulmonary status limiting activity, decreased balance, decreased coordination, decreased knowledge of use of DME,  decreased mobility, difficulty walking, decreased strength, decreased safety awareness, increased edema, and impaired UE functional use.   ACTIVITY LIMITATIONS standing, squatting, stairs, transfers, bed mobility, reach over head, and locomotion level  PARTICIPATION LIMITATIONS: shopping, community activity, and yard work  PERSONAL FACTORS Age, Behavior pattern, Fitness, Past/current experiences, and Time since onset of injury/illness/exacerbation are also affecting patient's functional outcome.   REHAB POTENTIAL: Good  CLINICAL DECISION MAKING: Evolving/moderate complexity  EVALUATION COMPLEXITY: Moderate   GOALS: Goals reviewed with patient? Yes  SHORT TERM GOALS: Target date: 01/03/2022  Will be compliant with appropriate progressive HEP to include walking vs floor bike program  Baseline: Goal status: INITIAL  2.  Will be able to name 3 ways to reduce fall risk at home and in community  Baseline:  Goal status: INITIAL  3.  Will demonstrate improved ankle dorsiflexor endurance and reduce related fall risk as evidenced by elimination of foot slap during 3MWT  Baseline:  Goal status: INITIAL  4. Will be independent with alternative ways to control edema in  BLEs (such as spiral wrapping with ACE bandages) Baseline:  Goal status: INITIAL    LONG TERM GOALS: Target date: 02/07/2022  MMT to improve by at least 1 grade in all weak groups  Baseline:  Goal status: INITIAL  2.  Will be able to ambulate at least 561f safely in 3MWT with use of SPC vs SBQC with no more than S level assist to improve community access  Baseline:  Goal status: INITIAL  3.  Will score at least 37 on the Berg to show improved balance/reduced fall risk  Baseline:  Goal status: INITIAL  4.  Will demonstrate improved UE strength as evidenced by ability to lift rollator in/out of car for improved community access (for extended gait distances 10058f+) Baseline:  Goal status: INITIAL  5.  Will be  compliant with advanced HEP vs gym program to maintain functional gains and prevent recurrence of impairments  Baseline:  Goal status: INITIAL     PLAN: PT FREQUENCY: 2x/week  PT DURATION: 10 weeks  PLANNED INTERVENTIONS: Therapeutic exercises, Therapeutic activity, Neuromuscular re-education, Balance training, Gait training, Patient/Family education, Self Care, Joint mobilization, Stair training, DME instructions, Aquatic Therapy, Taping, Ionotophoresis '4mg'$ /ml Dexamethasone, Manual therapy, and Re-evaluation.  PLAN FOR NEXT SESSION: focus on strength, balance, endurance; interested in gym machines so would probably enjoy incorporating this into sessions. Strengthen ankle dorsiflexors, may need to consider AFOs in the future if gait/ankle strength does not improve significantly with PT. Review and perform HEP.  FOTO next visit   RoSelinda MichaelsII PT, DPT 12/02/21 8:29 AM

## 2021-12-04 ENCOUNTER — Ambulatory Visit (INDEPENDENT_AMBULATORY_CARE_PROVIDER_SITE_OTHER): Payer: Medicare Other | Admitting: Internal Medicine

## 2021-12-04 VITALS — BP 126/70 | HR 57 | Temp 97.7°F | Wt 178.2 lb

## 2021-12-04 DIAGNOSIS — N1832 Acute kidney failure, unspecified: Secondary | ICD-10-CM

## 2021-12-04 DIAGNOSIS — N179 Acute kidney failure, unspecified: Secondary | ICD-10-CM

## 2021-12-04 DIAGNOSIS — I480 Paroxysmal atrial fibrillation: Secondary | ICD-10-CM | POA: Diagnosis not present

## 2021-12-04 DIAGNOSIS — Z09 Encounter for follow-up examination after completed treatment for conditions other than malignant neoplasm: Secondary | ICD-10-CM

## 2021-12-04 LAB — CBC WITH DIFFERENTIAL/PLATELET
Basophils Absolute: 0 10*3/uL (ref 0.0–0.1)
Basophils Relative: 0.7 % (ref 0.0–3.0)
Eosinophils Absolute: 0.1 10*3/uL (ref 0.0–0.7)
Eosinophils Relative: 1.2 % (ref 0.0–5.0)
HCT: 33.5 % — ABNORMAL LOW (ref 39.0–52.0)
Hemoglobin: 11.2 g/dL — ABNORMAL LOW (ref 13.0–17.0)
Lymphocytes Relative: 18.8 % (ref 12.0–46.0)
Lymphs Abs: 1.2 10*3/uL (ref 0.7–4.0)
MCHC: 33.5 g/dL (ref 30.0–36.0)
MCV: 99.7 fl (ref 78.0–100.0)
Monocytes Absolute: 0.8 10*3/uL (ref 0.1–1.0)
Monocytes Relative: 11.9 % (ref 3.0–12.0)
Neutro Abs: 4.3 10*3/uL (ref 1.4–7.7)
Neutrophils Relative %: 67.4 % (ref 43.0–77.0)
Platelets: 256 10*3/uL (ref 150.0–400.0)
RBC: 3.36 Mil/uL — ABNORMAL LOW (ref 4.22–5.81)
RDW: 17.2 % — ABNORMAL HIGH (ref 11.5–15.5)
WBC: 6.4 10*3/uL (ref 4.0–10.5)

## 2021-12-04 LAB — COMPREHENSIVE METABOLIC PANEL
ALT: 26 U/L (ref 0–53)
AST: 27 U/L (ref 0–37)
Albumin: 3.5 g/dL (ref 3.5–5.2)
Alkaline Phosphatase: 83 U/L (ref 39–117)
BUN: 31 mg/dL — ABNORMAL HIGH (ref 6–23)
CO2: 29 mEq/L (ref 19–32)
Calcium: 8.8 mg/dL (ref 8.4–10.5)
Chloride: 102 mEq/L (ref 96–112)
Creatinine, Ser: 1.39 mg/dL (ref 0.40–1.50)
GFR: 48.42 mL/min — ABNORMAL LOW (ref >60.00)
Glucose, Bld: 143 mg/dL — ABNORMAL HIGH (ref 70–99)
Potassium: 3.8 mEq/L (ref 3.5–5.1)
Sodium: 140 mEq/L (ref 135–145)
Total Bilirubin: 0.9 mg/dL (ref 0.2–1.2)
Total Protein: 6.1 g/dL (ref 6.0–8.3)

## 2021-12-04 NOTE — Progress Notes (Signed)
Established Patient Office Visit     CC/Reason for Visit: Hospital follow-up  HPI: Charles Lloyd. is a 81 y.o. male who is coming in today for the above mentioned reasons. Past Medical History is significant for: Atrial fibrillation, chronic diastolic heart failure who is stable and has been seen by cardiology locally.  Also has a history of well-controlled type 2 diabetes on metformin, hyperlipidemia on Lipitor, prior history of prostate cancer status post TURP, prior history of B12 deficiency on monthly supplementation, gout.  I saw him in September for frequent falls.  Labs showed that his creatinine and BUN had doubled in 2 weeks time.  He was sent to the emergency department.  He was admitted for 3 days, diuretics were held and he was given IV fluids.  His creatinine returned to his baseline of around 1.77 upon discharge.  He has resumed torsemide 20 mg daily.  He is now walking with a cane, he has commenced physical therapy.  He has not started wearing compression stockings recommended during hospital stay.   Past Medical/Surgical History: Past Medical History:  Diagnosis Date   Acute tracheobronchitis 01/05/2019   B12 deficiency    Basal cell carcinoma (BCC) of left side of nose 01/28/2018   Cancer ([place])    Cardiomyopathy ([place]) 03/10/2018   With chronic atrial fibrillation   CHF (congestive heart failure) ([place]) 04/16/2013   Functional class II, ejection fraction 35-40%  Formatting of this note might be different from the original. Functional class II, ejection fraction 35-40%   Chronic anticoagulation    Chronic diastolic (congestive) heart failure ([place]) 04/16/2013   Functional class II, ejection fraction 35-40%  Last Assessment & Plan:  Clinically stable Volume well controlled meds reviewed   Chronic diastolic CHF (congestive heart failure) ([place]) 04/16/2013   Functional class II, ejection fraction 35-40%  Last Assessment & Plan:  Clinically stable Volume well controlled  meds reviewed   Colon polyps 04/16/2013   Diabetic neuropathy (HCC)    Eosinophil count raised 02/17/2019   Essential hypertension 01/06/2010   Last Assessment & Plan:  Well controlled Continue med management   Gout    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  Steroid usage was discontinued several months ago.  Follow up with Dermatology as planned.   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   Last Assessment & Plan:  Id following ?ongoing abx managementy reported Request records   Infection of prosthetic right knee joint ([place])    Insomnia 03/04/2012   Grief with loss of wife 02/20/12  Last Assessment & Plan:  Improved sx  contineu xanax prn Se discussed   Lesion of liver 04/20/2019   -On cardiac CT 02/2019   Mild reactive airways disease    Neoplasm of prostate 04/16/2013   Neoplasm of prostate, malignant ([place]) 01/06/2010   [name] S/p radiation therapy   Last Assessment & Plan:  Repeat psa today No urinary sx Pt reported fatigue/poor appetite   On amiodarone therapy 09/07/2013   On continuous oral anticoagulation 01/28/2018   Other activity(E029.9) 04/20/2013   Formatting of this note might be different from the original. Transthoracic  Echocardiogram-03/10/[facility]: Normal left ventricular wall thickness and cavity size.  Global left ventricular systolic function is moderately reduced.  The estimated ejection fraction is 35-40%.  The left atrium is moderately enlarged.  The right atrium is mildly enlarged.  No significant valvular    Overweight (BMI 25.0-29.9) 10/22/2016   Persistent  atrial fibrillation ([place]) 04/16/2013   Last Assessment & Plan:  Rate controlled Continue med management eliquis for stroke preventino  Formatting of this note might be different from the original.  Drug  HX Current Rx Pre-ABL inefficacy Pre-ABL intolerant Post-ABL inefficacy Post-ABL intolerant max dose/24h M/Y end comments  sotalol                  dofetilide                  flecainide                  propafenone                  am   S/P TKR (total knee replacement), bilateral    Secondary hypercoagulable state ([place]) 04/09/2019   Tinea cruris 04/16/2013   Type 2 diabetes mellitus with diabetic polyneuropathy, without long-term current use of insulin ([place]) 04/16/2013   Last Assessment & Plan:  Labs today Pt reports well controlled on ambulatory monitoring   Vitamin D deficiency 07/25/2018    Past Surgical History:  Procedure Laterality Date   ATRIAL FIBRILLATION ABLATION N/A 03/12/2019   Procedure: ATRIAL FIBRILLATION ABLATION;  Surgeon: [name], MD;  Location: [facility];  Service: Cardiovascular;  Laterality: N/A;   ATRIAL FIBRILLATION ABLATION N/A 04/29/2020   Procedure: ATRIAL FIBRILLATION ABLATION;  Surgeon: [name], MD;  Location: [facility];  Service: Cardiovascular;  Laterality: N/A;   CARDIAC ELECTROPHYSIOLOGY STUDY AND ABLATION     CARDIOVERSION     CARDIOVERSION N/A 10/10/2018   Procedure: CARDIOVERSION;  Surgeon: [name], MD;  Location: [facility] ENDOSCOPY;  Service: Cardiovascular;  Laterality: N/A;   CARDIOVERSION N/A 05/18/2020   Procedure: CARDIOVERSION;  Surgeon: [name], MD;  Location: [facility] ENDOSCOPY;  Service: Cardiovascular;  Laterality: N/A;   CARDIOVERSION N/A 12/09/2020   Procedure: CARDIOVERSION;  Surgeon: Lelon Perla, MD;  Location: [facility] ENDOSCOPY;  Service: Cardiovascular;  Laterality: N/A;   CHOLECYSTECTOMY     PROSTATECTOMY     REPLACEMENT TOTAL KNEE BILATERAL      Social History:  reports that he has never  smoked. He has never used smokeless tobacco. He reports current alcohol use of about 14.0 standard drinks of alcohol per week. He reports that he does not use drugs.  Allergies: No Known Allergies  Family History:  Family History  Problem Relation Age of Onset   Cancer Mother    Depression Mother    Early death Mother    Cancer Father    Depression Father    Early death Father      Current Outpatient Medications:    ACCU-CHEK AVIVA PLUS test strip, 1 EACH BY OTHER ROUTE DAILY. DX E11.9, Disp: 100 strip, Rfl: 12   albuterol (VENTOLIN HFA) 108 (90 Base) MCG/ACT inhaler, Inhale 1-2 puffs into the lungs every 6 (six) hours as needed for wheezing or shortness of breath., Disp: 8 g, Rfl: 2   atorvastatin (LIPITOR) 20 MG tablet, TAKE 1 TABLET DAILY (Patient taking differently: Take 20 mg by mouth daily.), Disp: 90 tablet, Rfl: 1   carvedilol (COREG)  3.125 MG tablet, Take 1 tablet (3.125 mg total) by mouth 2 (two) times daily., Disp: 60 tablet, Rfl: 0   ELIQUIS 5 MG TABS tablet, TAKE 1 TABLET TWICE A DAY (Patient taking differently: Take 5 mg by mouth 2 (two) times daily.), Disp: 180 tablet, Rfl: 3   flecainide (TAMBOCOR) 100 MG tablet, TAKE 1 TABLET BY MOUTH TWICE A DAY, Disp: 180 tablet, Rfl: 3   gabapentin (NEURONTIN) 300 MG capsule, TAKE 2 CAPSULES BY MOUTH AT BEDTIME (Patient taking differently: Take 600 mg by mouth at bedtime.), Disp: 60 capsule, Rfl: 2   KLOR-CON M20 20 MEQ tablet, TAKE 1 TABLET BY MOUTH EVERY DAY, Disp: 90 tablet, Rfl: 3   montelukast (SINGULAIR) 10 MG tablet, Take 10 mg by mouth at bedtime. , Disp: , Rfl:    RESTASIS 0.05 % ophthalmic emulsion, Place 1 drop into both eyes 2 (two) times daily as needed (dry eyes)., Disp: , Rfl:    torsemide (DEMADEX) 20 MG tablet, Take 1 tablet (20 mg total) by mouth daily. Start taking from Monday only, Disp: 180 tablet, Rfl: 3   allopurinol (ZYLOPRIM) 300 MG tablet, Take 1 tablet (300 mg total) by mouth daily. (Patient not taking:  Reported on 11/16/2021), Disp: 90 tablet, Rfl: 1   Continuous Blood Gluc Receiver (FREESTYLE LIBRE 2 READER) DEVI, 1 each by Does not apply route daily. (Patient not taking: Reported on 12/04/2021), Disp: 1 each, Rfl: 2   Continuous Blood Gluc Sensor (FREESTYLE LIBRE 2 SENSOR) MISC, 1 each by Does not apply route daily. (Patient not taking: Reported on 12/04/2021), Disp: 2 each, Rfl: 6   fluticasone (FLONASE) 50 MCG/ACT nasal spray, Place 2 sprays into both nostrils daily. (Patient not taking: Reported on 12/04/2021), Disp: 16 g, Rfl: 2   fluticasone furoate-vilanterol (BREO ELLIPTA) 100-25 MCG/INH AEPB, Inhale 1 puff into the lungs daily. (Patient not taking: Reported on 12/04/2021), Disp: 60 each, Rfl: 11   insulin glargine (LANTUS SOLOSTAR) 100 UNIT/ML Solostar Pen, Inject 8 Units into the skin daily. (Patient not taking: Reported on 12/04/2021), Disp: 15 mL, Rfl: 0  Review of Systems:  Constitutional: Denies fever, chills, diaphoresis, appetite change. HEENT: Denies photophobia, eye pain, redness, hearing loss, ear pain, congestion, sore throat, rhinorrhea, sneezing, mouth sores, trouble swallowing, neck pain, neck stiffness and tinnitus.   Respiratory: Denies SOB, DOE, cough, chest tightness,  and wheezing.   Cardiovascular: Denies chest pain, palpitations and leg swelling.  Gastrointestinal: Denies nausea, vomiting, abdominal pain, diarrhea, constipation, blood in stool and abdominal distention.  Genitourinary: Denies dysuria, urgency, frequency, hematuria, flank pain and difficulty urinating.  Endocrine: Denies: hot or cold intolerance, sweats, changes in hair or nails, polyuria, polydipsia. Musculoskeletal: Positive for myalgias, back pain, joint swelling, arthralgias and gait problem.  Skin: Denies pallor, rash and wound.  Neurological: Denies dizziness, seizures, syncope,  light-headedness, numbness and headaches.  Hematological: Denies adenopathy. Easy bruising, personal or family bleeding  history  Psychiatric/Behavioral: Denies suicidal ideation, mood changes, confusion, nervousness, sleep disturbance and agitation    Physical Exam: Vitals:   12/04/21 1132  BP: 126/70  Pulse: (!) 57  Temp: 97.7 F (36.5 C)  TempSrc: Oral  SpO2: 97%  Weight: 178 lb 3.2 oz (80.8 kg)    Body mass index is 25.57 kg/m.   Constitutional: NAD, calm, comfortable Eyes: PERRL, lids and conjunctivae normal ENMT: Mucous membranes are moist.   Psychiatric: Normal judgment and insight. Alert and oriented x 3. Normal mood.    Impression and Plan:  Hospital discharge follow-up  Paroxysmal A-fib (HCC)  Acute renal failure superimposed on stage 3b chronic kidney disease, unspecified acute renal failure type ([place]) - Plan: CBC with Differential/Platelet, Comprehensive metabolic panel  [facility] charts reviewed in great detail. -It appears he had acute on chronic kidney disease stage IIIb due to increasing torsemide doses for lower extremity edema.  This responded to holding diuretics and IV fluids. -He is now back on torsemide 20 mg daily. -He has not yet wearing compression stockings. -He also has severe L4-L5 lumbar spinal stenosis and has follow-up scheduled with neurosurgery for November. -I will order labs today to follow-up on kidney numbers.  Time spent:32 minutes reviewing chart, interviewing and examining patient and formulating plan of care.     Lelon Frohlich, MD [facility]

## 2021-12-06 NOTE — Therapy (Signed)
OUTPATIENT PHYSICAL THERAPY TREATMENT NOTE   Patient Name: Charles Lloyd. MRN: [id number] DOB:[birth year], 81 y.o., male Today's Date: 12/07/2021   PT End of Session - 12/07/21 1307     Visit Number 3    Number of Visits 21    Date for PT Re-Evaluation 02/07/22    Authorization Type [employer] MCR    Authorization Time Period 11/29/21 to 02/07/22    Progress Note Due on Visit 10    PT Start Time 1303    PT Stop Time 1344    PT Time Calculation (min) 41 min    Activity Tolerance Patient tolerated treatment well    Behavior During Therapy [facility] for tasks assessed/performed               Past Medical History:  Diagnosis Date   Acute tracheobronchitis 01/05/2019   B12 deficiency    Basal cell carcinoma (BCC) of left side of nose 01/28/2018   Cancer ([place])    Cardiomyopathy ([place]) 03/10/2018   With chronic atrial fibrillation   CHF (congestive heart failure) ([place]) 04/16/2013   Functional class II, ejection fraction 35-40%  Formatting of this note might be different from the original. Functional class II, ejection fraction 35-40%   Chronic anticoagulation    Chronic diastolic (congestive) heart failure ([place]) 04/16/2013   Functional class II, ejection fraction 35-40%  Last Assessment & Plan:  Clinically stable Volume well controlled meds reviewed   Chronic diastolic CHF (congestive heart failure) ([place]) 04/16/2013   Functional class II, ejection fraction 35-40%  Last Assessment & Plan:  Clinically stable Volume well controlled meds reviewed   Colon polyps 04/16/2013   Diabetic neuropathy (HCC)    Eosinophil count raised 02/17/2019   Essential hypertension 01/06/2010   Last Assessment & Plan:  Well controlled Continue med management   Gout    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  Steroid usage was discontinued several months ago.  Follow up with Dermatology as planned.   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   Last Assessment & Plan:  Id following ?ongoing abx managementy reported Request records   Infection of prosthetic right knee joint ([place])    Insomnia 03/04/2012   Grief with loss of wife 02/20/12  Last Assessment & Plan:  Improved sx  contineu xanax prn Se discussed   Lesion of liver 04/20/2019   -On cardiac CT 02/2019   Mild reactive airways disease    Neoplasm of prostate 04/16/2013   Neoplasm of prostate, malignant ([place]) 01/06/2010   [name] S/p radiation therapy   Last Assessment & Plan:  Repeat psa today No urinary sx Pt reported fatigue/poor appetite   On amiodarone therapy 09/07/2013   On continuous oral anticoagulation 01/28/2018   Other activity(E029.9) 04/20/2013   Formatting of this note might be different from the original. Transthoracic Echocardiogram-03/10/[facility]: Normal left ventricular wall thickness and cavity size.  Global left ventricular systolic function is moderately reduced.  The estimated ejection fraction is 35-40%.  The left atrium is moderately enlarged.  The right atrium is mildly enlarged.  No significant valvular    Overweight (BMI 25.0-29.9) 10/22/2016  Persistent atrial fibrillation ([place]) 04/16/2013   Last Assessment & Plan:  Rate controlled Continue med management eliquis for stroke preventino  Formatting of this note might be different from the original.  Drug  HX Current Rx Pre-ABL inefficacy Pre-ABL intolerant Post-ABL inefficacy Post-ABL intolerant max dose/24h M/Y end comments  sotalol                  dofetilide                  flecainide                  propafenone                  am   S/P TKR (total knee  replacement), bilateral    Secondary hypercoagulable state ([place]) 04/09/2019   Tinea cruris 04/16/2013   Type 2 diabetes mellitus with diabetic polyneuropathy, without long-term current use of insulin ([place]) 04/16/2013   Last Assessment & Plan:  Labs today Pt reports well controlled on ambulatory monitoring   Vitamin D deficiency 07/25/2018   Past Surgical History:  Procedure Laterality Date   ATRIAL FIBRILLATION ABLATION N/A 03/12/2019   Procedure: ATRIAL FIBRILLATION ABLATION;  Surgeon: [name], MD;  Location: [facility];  Service: Cardiovascular;  Laterality: N/A;   ATRIAL FIBRILLATION ABLATION N/A 04/29/2020   Procedure: ATRIAL FIBRILLATION ABLATION;  Surgeon: [name], MD;  Location: [facility];  Service: Cardiovascular;  Laterality: N/A;   CARDIAC ELECTROPHYSIOLOGY STUDY AND ABLATION     CARDIOVERSION     CARDIOVERSION N/A 10/10/2018   Procedure: CARDIOVERSION;  Surgeon: [name], MD;  Location: [facility]COPY;  Service: Cardiovascular;  Laterality: N/A;   CARDIOVERSION N/A 05/18/2020   Procedure: CARDIOVERSION;  Surgeon: [name], MD;  Location: [place];  Service: Cardiovascular;  Laterality: N/A;   CARDIOVERSION N/A 12/09/2020   Procedure: CARDIOVERSION;  Surgeon: Lelon Perla, MD;  Location: [facility] ENDOSCOPY;  Service: Cardiovascular;  Laterality: N/A;   CHOLECYSTECTOMY     PROSTATECTOMY     REPLACEMENT TOTAL KNEE BILATERAL     Patient Active Problem List   Diagnosis Date Noted   Acute renal failure superimposed on stage 3b chronic kidney disease ([place]) 11/16/2021   Uremia 11/16/2021   Gout due to renal impairment 10/17/2021   Pancytopenia ([place]) 08/05/2021   SIRS (systemic inflammatory response syndrome) ([place]) 08/04/2021   Chronic kidney disease, stage 3b ([place]) 12/30/2020   Multiple falls    Spinal stenosis    S/P TKR (total knee replacement), bilateral    Arthralgia 11/20/2019   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   Lesion  of liver 04/20/2019   Secondary hypercoagulable state ([place]) 04/09/2019   Eosinophil count raised 02/17/2019   Acute tracheobronchitis 01/05/2019   Asthma    Vitamin D deficiency 07/25/2018   Chronic anticoagulation 03/06/2018   Hyperlipidemia associated with type 2 diabetes mellitus ([place]) 01/28/2018   Basal cell carcinoma (BCC) of left side of nose 01/28/2018   Infected prosthetic knee joint ([place]) 06/24/2017   B12 deficiency 10/22/2016   Overweight (BMI 25.0-29.9) 10/22/2016   Primary osteoarthritis of right knee 03/29/2015   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   Paroxysmal A-fib ([place]) 04/16/2013   Chronic diastolic (congestive) heart failure ([place]) 04/16/2013   Colon polyps 04/16/2013   NIDDM-2 with polyneuropathy and hyperglycemia 04/16/2013   Tinea cruris 04/16/2013   Diabetic neuropathy ([place]) 07/14/2012   Insomnia 03/04/2012   Hypertensive heart disease with heart failure ([place]) 01/06/2010  Neoplasm of prostate, malignant ([place]) 01/06/2010   Hypercholesterolemia 01/06/2010   Essential hypertension 01/06/2010    PCP: Isaac Bliss, Olam Idler   REFERRING PROVIDER: Reece Agar, MD   REFERRING DIAG: [id number] (ICD-10-CM) - Spinal stenosis, lumbar region with neurogenic claudication   Rationale for Evaluation and Treatment Rehabilitation  THERAPY DIAG:  Unsteadiness on feet  Repeated falls  Muscle weakness (generalized)  Other low back pain  ONSET DATE: 11/15/2021   SUBJECTIVE:                                                                                                                                                                                           SUBJECTIVE STATEMENT: He is limited with ambulation and uses a cane.  He states he has weakness in his legs ambulating for 15-20 mins in the grocery store.  He states he has swelling in bilat ankles.  Pt denies any adverse effects after prior Rx.   Pt uses a SPC outside of home, but uses the rollator walking in here  due to the hill outside.  Pt doesn't use any AD at home.  Pt states his back is aggravated 1st thing in AM though not painful.  It improves as he moves and walks around.  Pt states he has a new mattress.  PERTINENT HISTORY:  Lumbar anterolisthesis, Peripheral neuropathy, TKR bilat, A-fib, CHF, DM  PAIN:  Are you having pain? Yes: NPRS scale: 2/10 Pain location: central lumbar  Pain description: dull, achy Aggravating factors: none  Relieving factors: injections    PRECAUTIONS: Fall and Other: severe neuropathy   WEIGHT BEARING RESTRICTIONS No  FALLS:  Has patient fallen in last 6 months? Yes. Number of falls 3.5 (caught himself on one fall, on the other 3 falls my legs just gave way like someone took my legs out from under me)  LIVING ENVIRONMENT: Lives with: lives alone and family is near by and can help him easily  Lives in: House/apartment Stairs:  two story townhouse with stair lift inside, no STE but does have to deal with one step going up to chair lift Has following equipment at home: Single point cane, Walker - 4 wheeled, and stair lift, raised toilet seats, shower stool   OCCUPATION: retired   PLOF: Independent, Independent with basic ADLs, and Requires assistive device for independence  PATIENT GOALS stop falling, try to gain some strength   OBJECTIVE:   DIAGNOSTIC FINDINGS:  IMPRESSION: 1. 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 2. Stable to slight progression of severe spinal stenosis and moderate neural foraminal stenosis at L2-3. 3. Slight progression of moderate spinal stenosis at L3-4.  TODAY'S TREATMENT   Therapeutic Exercise:   Reviewed and performed HEP. Pt was educated in correct performance and palpation of TrA contraction. Pt performed: Ankle DF with T band with YTB 2x10 bilat Supine TrA contraction with 5 sec hold Supine marching with TrA 2x10 Seated marching with GTB 2x10 Seated clams with GTB 2x10 Seated LAQ with RTB 2x10   Seated heel raises 2x10 Seated toe raises x 10 and x 5      Neuro Re-ed Activities:   Standing weight shifts with UE support s/s and f/b x 10 reps with SBA   Standing with FT without UE support 2x30 sec with SBA   Staggered stance without UE support except occasions of UE support with L UE back x 30 sec    Airex standing with FA x30 sec and FT x 30 sec  PATIENT EDUCATION:  Education details: Pt received a HEP.  Exercise form, rationale of exercises, and POC.  Person educated: Patient and Child(ren) Education method: Explanation Education comprehension: verbalized understanding, returned demonstration, and needs further education   HOME EXERCISE PROGRAM: Access Code: [email or web address] URL: [email or web address] Date: 12/01/2021 Prepared by: Ronny Flurry  Exercises - Supine Transversus Abdominis Bracing - Hands on Stomach  - 2 x daily - 7 x weekly - 2 sets - 10 reps - Supine March  - 1-2 x daily - 7 x weekly - 2 sets - 5-10 reps - Seated Hip Abduction with Resistance  - 1 x daily - 4 x weekly - 2 sets - 10 reps - Seated Knee Extension with Resistance  - 1 x daily - 3-4 x weekly - 2 sets - 10 reps - Seated March  - 1 x daily - 3-4 x weekly - 2 sets - 10 reps - Seated Heel Raise  - 1 x daily - 7 x weekly - 2 sets - 10 reps  ASSESSMENT:  CLINICAL IMPRESSION: PT reviewed HEP and pt performed HEP.  Pt performed exercises well with cuing and instruction in correct form including how to correctly activate TrA.  Pt is limited in ROM with seated bilat toe raises though significantly limited on R.  Pt had increased LOB when L LE was the back leg during staggered stance requiring UE support to correct LOB.  Pt responded well to Rx reporting no increased pain and states he feels a little better after Rx.  Pt should benefit from skilled PT services to address impairments and goals and to improve function.  OBJECTIVE IMPAIRMENTS Abnormal gait, cardiopulmonary status limiting activity,  decreased balance, decreased coordination, decreased knowledge of use of DME, decreased mobility, difficulty walking, decreased strength, decreased safety awareness, increased edema, and impaired UE functional use.   ACTIVITY LIMITATIONS standing, squatting, stairs, transfers, bed mobility, reach over head, and locomotion level  PARTICIPATION LIMITATIONS: shopping, community activity, and yard work  PERSONAL FACTORS Age, Behavior pattern, Fitness, Past/current experiences, and Time since onset of injury/illness/exacerbation are also affecting patient's functional outcome.   REHAB POTENTIAL: Good  CLINICAL DECISION MAKING: Evolving/moderate complexity  EVALUATION COMPLEXITY: Moderate   GOALS: Goals reviewed with patient? Yes  SHORT TERM GOALS: Target date: 01/03/2022  Will be compliant with appropriate progressive HEP to include walking vs floor bike program  Baseline: Goal status: INITIAL  2.  Will be able to name 3 ways to reduce fall risk at home and in community  Baseline:  Goal status: INITIAL  3.  Will demonstrate improved ankle dorsiflexor endurance and reduce related fall risk as evidenced by elimination of foot  slap during 3MWT  Baseline:  Goal status: INITIAL  4. Will be independent with alternative ways to control edema in BLEs (such as spiral wrapping with ACE bandages) Baseline:  Goal status: INITIAL    LONG TERM GOALS: Target date: 02/07/2022  MMT to improve by at least 1 grade in all weak groups  Baseline:  Goal status: INITIAL  2.  Will be able to ambulate at least 524f safely in 3MWT with use of SPC vs SBQC with no more than S level assist to improve community access  Baseline:  Goal status: INITIAL  3.  Will score at least 37 on the Berg to show improved balance/reduced fall risk  Baseline:  Goal status: INITIAL  4.  Will demonstrate improved UE strength as evidenced by ability to lift rollator in/out of car for improved community access (for  extended gait distances 10058f+) Baseline:  Goal status: INITIAL  5.  Will be compliant with advanced HEP vs gym program to maintain functional gains and prevent recurrence of impairments  Baseline:  Goal status: INITIAL     PLAN: PT FREQUENCY: 2x/week  PT DURATION: 10 weeks  PLANNED INTERVENTIONS: Therapeutic exercises, Therapeutic activity, Neuromuscular re-education, Balance training, Gait training, Patient/Family education, Self Care, Joint mobilization, Stair training, DME instructions, Aquatic Therapy, Taping, Ionotophoresis '4mg'$ /ml Dexamethasone, Manual therapy, and Re-evaluation.  PLAN FOR NEXT SESSION: focus on strength, balance, endurance; interested in gym machines so would probably enjoy incorporating this into sessions. Strengthen ankle dorsiflexors, may need to consider AFOs in the future if gait/ankle strength does not improve significantly with PT. Review and perform HEP.  FOTO next visit   RoSelinda MichaelsII PT, DPT 12/07/21 10:58 PM

## 2021-12-07 ENCOUNTER — Ambulatory Visit (HOSPITAL_BASED_OUTPATIENT_CLINIC_OR_DEPARTMENT_OTHER): Payer: Medicare Other | Admitting: Physical Therapy

## 2021-12-07 ENCOUNTER — Encounter (HOSPITAL_BASED_OUTPATIENT_CLINIC_OR_DEPARTMENT_OTHER): Payer: Self-pay | Admitting: Physical Therapy

## 2021-12-07 DIAGNOSIS — R296 Repeated falls: Secondary | ICD-10-CM

## 2021-12-07 DIAGNOSIS — R2681 Unsteadiness on feet: Secondary | ICD-10-CM

## 2021-12-07 DIAGNOSIS — M5459 Other low back pain: Secondary | ICD-10-CM

## 2021-12-07 DIAGNOSIS — M6281 Muscle weakness (generalized): Secondary | ICD-10-CM

## 2021-12-07 DIAGNOSIS — M48062 Spinal stenosis, lumbar region with neurogenic claudication: Secondary | ICD-10-CM | POA: Diagnosis not present

## 2021-12-11 ENCOUNTER — Other Ambulatory Visit: Payer: Self-pay | Admitting: Internal Medicine

## 2021-12-11 NOTE — Therapy (Unsigned)
OUTPATIENT PHYSICAL THERAPY TREATMENT NOTE   Patient Name: Charles Lloyd. MRN: [id number] DOB:1940/06/19, 81 y.o., male Today's Date: 12/12/2021    Past Medical History:  Diagnosis Date   Acute tracheobronchitis 01/05/2019   B12 deficiency    Basal cell carcinoma (BCC) of left side of nose 01/28/2018   Cancer ([place])    Cardiomyopathy ([place]) 03/10/2018   With chronic atrial fibrillation   CHF (congestive heart failure) ([place]) 04/16/2013   Functional class II, ejection fraction 35-40%  Formatting of this note might be different from the original. Functional class II, ejection fraction 35-40%   Chronic anticoagulation    Chronic diastolic (congestive) heart failure ([place]) 04/16/2013   Functional class II, ejection fraction 35-40%  Last Assessment & Plan:  Clinically stable Volume well controlled meds reviewed   Chronic diastolic CHF (congestive heart failure) ([place]) 04/16/2013   Functional class II, ejection fraction 35-40%  Last Assessment & Plan:  Clinically stable Volume well controlled meds reviewed   Colon polyps 04/16/2013   Diabetic neuropathy (HCC)    Eosinophil count raised 02/17/2019   Essential hypertension 01/06/2010   Last Assessment & Plan:  Well controlled Continue med management   Gout    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  Steroid usage was discontinued several months ago.  Follow up with Dermatology as planned.   Hypercholesterolemia 01/06/2010   Last Assessment & Plan:  Repeat labs recommended   Hyperlipidemia associated with type 2 diabetes mellitus ([place]) 01/28/2018   Hypertensive heart disease with heart failure ([place]) 01/06/2010   Last Assessment & Plan:  Well controlled Continue med management   Hyponatremia 12/17/2018   Infected prosthetic knee joint ([place]) 06/24/2017   Last Assessment & Plan:  Id following ?ongoing abx managementy reported Request records   Infection of prosthetic right knee  joint ([place])    Insomnia 03/04/2012   Grief with loss of wife 02/20/12  Last Assessment & Plan:  Improved sx  contineu xanax prn Se discussed   Lesion of liver 04/20/2019   -On cardiac CT 02/2019   Mild reactive airways disease    Neoplasm of prostate 04/16/2013   Neoplasm of prostate, malignant ([place]) 01/06/2010   [name] S/p radiation therapy   Last Assessment & Plan:  Repeat psa today No urinary sx Pt reported fatigue/poor appetite   On amiodarone therapy 09/07/2013   On continuous oral anticoagulation 01/28/2018   Other activity(E029.9) 04/20/2013   Formatting of this note might be different from the original. Transthoracic Echocardiogram-03/10/[facility]: Normal left ventricular wall thickness and cavity size.  Global left ventricular systolic function is moderately reduced.  The estimated ejection fraction is 35-40%.  The left atrium is moderately enlarged.  The right atrium is mildly enlarged.  No significant valvular    Overweight (BMI 25.0-29.9) 10/22/2016   Persistent atrial fibrillation ([place]) 04/16/2013   Last Assessment & Plan:  Rate controlled Continue med management eliquis for stroke preventino  Formatting of this note might be different from the original.  Drug  HX Current Rx Pre-ABL inefficacy Pre-ABL intolerant Post-ABL inefficacy Post-ABL intolerant max dose/24h M/Y end comments  sotalol                  dofetilide                  flecainide                  propafenone  am   S/P TKR (total knee replacement), bilateral    Secondary hypercoagulable state ([place]) 04/09/2019   Tinea cruris 04/16/2013   Type 2 diabetes mellitus with diabetic polyneuropathy, without long-term current use of insulin ([place]) 04/16/2013   Last Assessment & Plan:  Labs today Pt reports well controlled on ambulatory monitoring   Vitamin D deficiency 07/25/2018   Past Surgical History:  Procedure Laterality Date   ATRIAL FIBRILLATION ABLATION N/A 03/12/2019   Procedure:  ATRIAL FIBRILLATION ABLATION;  Surgeon: [name], MD;  Location: [facility];  Service: Cardiovascular;  Laterality: N/A;   ATRIAL FIBRILLATION ABLATION N/A 04/29/2020   Procedure: ATRIAL FIBRILLATION ABLATION;  Surgeon: [name], MD;  Location: [facility];  Service: Cardiovascular;  Laterality: N/A;   CARDIAC ELECTROPHYSIOLOGY STUDY AND ABLATION     CARDIOVERSION     CARDIOVERSION N/A 10/10/2018   Procedure: CARDIOVERSION;  Surgeon: [name], MD;  Location: [facility];  Service: Cardiovascular;  Laterality: N/A;   CARDIOVERSION N/A 05/18/2020   Procedure: CARDIOVERSION;  Surgeon: [name], MD;  [facility] ENDOSCOPY;  Service: Cardiovascular;  Laterality: N/A;   CARDIOVERSION N/A 12/09/2020   Procedure: CARDIOVERSION;  Surgeon: Lelon Perla, MD;  Location[facility] ENDOSCOPY;  Service: Cardiovascular;  Laterality: N/A;   CHOLECYSTECTOMY     PROSTATECTOMY     REPLACEMENT TOTAL KNEE BILATERAL     Patient Active Problem List   Diagnosis Date Noted   Acute renal failure superimposed on stage 3b chronic kidney disease ([place]) 11/16/2021   Uremia 11/16/2021   Gout due to renal impairment 10/17/2021   Pancytopenia ([place]) 08/05/2021   SIRS (systemic inflammatory response syndrome) ([place]) 08/04/2021   Chronic kidney disease, stage 3b ([place]) 12/30/2020   Multiple falls    Spinal stenosis    S/P TKR (total knee replacement), bilateral    Arthralgia 11/20/2019   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   Lesion of liver 04/20/2019   Secondary hypercoagulable state ([place]) 04/09/2019   Eosinophil count raised 02/17/2019   Acute tracheobronchitis 01/05/2019   Asthma    Vitamin D deficiency 07/25/2018   Chronic anticoagulation 03/06/2018   Hyperlipidemia associated with type 2 diabetes mellitus ([place]) 01/28/2018   Basal cell carcinoma (BCC) of left side of nose 01/28/2018   Infected prosthetic knee joint ([place]) 06/24/2017   B12 deficiency 10/22/2016    Overweight (BMI 25.0-29.9) 10/22/2016   Primary osteoarthritis of right knee 03/29/2015   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   Paroxysmal A-fib ([place]) 04/16/2013   Chronic diastolic (congestive) heart failure ([place]) 04/16/2013   Colon polyps 04/16/2013   NIDDM-2 with polyneuropathy and hyperglycemia 04/16/2013   Tinea cruris 04/16/2013   Diabetic neuropathy ([place]) 07/14/2012   Insomnia 03/04/2012   Hypertensive heart disease with heart failure ([place]) 01/06/2010   Neoplasm of prostate, malignant ([place]) 01/06/2010   Hypercholesterolemia 01/06/2010   Essential hypertension 01/06/2010    PCP: Isaac Bliss, [facility] Idler   REFERRING PROVIDER: Reece Agar, MD   REFERRING DIAG: [phone] (ICD-10-CM) - Spinal stenosis, lumbar region with neurogenic claudication   Rationale for Evaluation and Treatment Rehabilitation  THERAPY DIAG:  Unsteadiness on feet  Repeated falls  Muscle weakness (generalized)  Other low back pain  ONSET DATE: 11/15/2021   SUBJECTIVE:  SUBJECTIVE STATEMENT: He is limited with ambulation and uses a cane.  He states he has weakness in his legs ambulating for 15-20 mins in the grocery store.  He states he has swelling in bilat ankles.  Pt denies any adverse effects after prior Rx.   Pt uses a SPC outside of home, but uses the rollator walking in here due to the hill outside.  Pt doesn't use any AD at home.  Pt states his back is aggravated 1st thing in AM though not painful.  It improves as he moves and walks around.  Pt states he has a new mattress.  PERTINENT HISTORY:  Lumbar anterolisthesis, Peripheral neuropathy, TKR bilat, A-fib, CHF, DM  PAIN:  Are you having pain? Yes: NPRS scale: 2/10 Pain location: central lumbar  Pain description: dull, achy Aggravating factors: none  Relieving  factors: injections    PRECAUTIONS: Fall and Other: severe neuropathy   WEIGHT BEARING RESTRICTIONS No  FALLS:  Has patient fallen in last 6 months? Yes. Number of falls 3.5 (caught himself on one fall, on the other 3 falls my legs just gave way like someone took my legs out from under me)  LIVING ENVIRONMENT: Lives with: lives alone and family is near by and can help him easily  Lives in: House/apartment Stairs:  two story townhouse with stair lift inside, no STE but does have to deal with one step going up to chair lift Has following equipment at home: Single point cane, Walker - 4 wheeled, and stair lift, raised toilet seats, shower stool   OCCUPATION: retired   PLOF: Independent, Independent with basic ADLs, and Requires assistive device for independence  PATIENT GOALS stop falling, try to gain some strength   OBJECTIVE:   DIAGNOSTIC FINDINGS:  IMPRESSION: 1. 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 2. Stable to slight progression of severe spinal stenosis and moderate neural foraminal stenosis at L2-3. 3. Slight progression of moderate spinal stenosis at L3-4.    TODAY'S TREATMENT  12/12/2021: Therapeutic Exercise:   NuStep lvl5, 5 min Pt was educated in correct performance and palpation of TrA contraction. Pt performed: Ankle DF with T band with YTB 2x10 bilat Supine TrA contraction with 5 sec hold Supine marching with TrA 2x10 Seated marching with GTB 2x10 Seated clams with GTB 2x10 Seated LAQ with RTB 2x10  Seated heel raises 2x10 Seated toe raises x 10 and x 5 1 lap around clinic with Rolator and cues for Heel to toe gait pattern       Neuro Re-ed Activities:   Standing weight shifts with UE support s/s and f/b x 10 reps with SBA   Standing with FT without UE support 2x30 sec with SBA   Staggered stance without UE support except occasions of UE support with L UE back x 30 sec    Airex standing with FA x30 sec and FT x 30 sec  Therapeutic  Exercise:   Reviewed and performed HEP. Pt was educated in correct performance and palpation of TrA contraction. Pt performed: Ankle DF with T band with YTB 2x10 bilat Supine TrA contraction with 5 sec hold Supine marching with TrA 2x10 Seated marching with GTB 2x10 Seated clams with GTB 2x10 Seated LAQ with RTB 2x10  Seated heel raises 2x10 Seated toe raises x 10 and x 5      Neuro Re-ed Activities:   Standing weight shifts with UE support s/s and f/b x 10 reps with SBA   Standing with FT without UE  support 2x30 sec with SBA   Staggered stance without UE support except occasions of UE support with L UE back x 30 sec    Airex standing with FA x30 sec and FT x 30 sec  PATIENT EDUCATION:  Education details: Pt received a HEP.  Exercise form, rationale of exercises, and POC.  Person educated: Patient and Child(ren) Education method: Explanation Education comprehension: verbalized understanding, returned demonstration, and needs further education   HOME EXERCISE PROGRAM: Access Code: [email or web address] URL: [email or web address] Date: 12/01/2021 Prepared by: Ronny Flurry  Exercises - Supine Transversus Abdominis Bracing - Hands on Stomach  - 2 x daily - 7 x weekly - 2 sets - 10 reps - Supine March  - 1-2 x daily - 7 x weekly - 2 sets - 5-10 reps - Seated Hip Abduction with Resistance  - 1 x daily - 4 x weekly - 2 sets - 10 reps - Seated Knee Extension with Resistance  - 1 x daily - 3-4 x weekly - 2 sets - 10 reps - Seated March  - 1 x daily - 3-4 x weekly - 2 sets - 10 reps - Seated Heel Raise  - 1 x daily - 7 x weekly - 2 sets - 10 reps  ASSESSMENT:  CLINICAL IMPRESSION: Pt performed exercises well with cuing and instruction in correct form. Pt continues to demonstrate the most limitation with DF, significantly limited on R. He demonstrates decreased balance today with tandem stance and when standing with NBOS on airex pad with a LOB in posterior direction. Pt able to  catch himself.  Pt responded well to Rx reporting no increased pain and states he feels a little better after Rx.  Active walking with focus on heel to toe gait pattern due to pt with tendency to foot slap. Pt should benefit from skilled PT services to address impairments and goals and to improve function.  OBJECTIVE IMPAIRMENTS Abnormal gait, cardiopulmonary status limiting activity, decreased balance, decreased coordination, decreased knowledge of use of DME, decreased mobility, difficulty walking, decreased strength, decreased safety awareness, increased edema, and impaired UE functional use.   ACTIVITY LIMITATIONS standing, squatting, stairs, transfers, bed mobility, reach over head, and locomotion level  PARTICIPATION LIMITATIONS: shopping, community activity, and yard work  PERSONAL FACTORS Age, Behavior pattern, Fitness, Past/current experiences, and Time since onset of injury/illness/exacerbation are also affecting patient's functional outcome.   REHAB POTENTIAL: Good  CLINICAL DECISION MAKING: Evolving/moderate complexity  EVALUATION COMPLEXITY: Moderate   GOALS: Goals reviewed with patient? Yes  SHORT TERM GOALS: Target date: 01/03/2022  Will be compliant with appropriate progressive HEP to include walking vs floor bike program  Baseline: Goal status: INITIAL  2.  Will be able to name 3 ways to reduce fall risk at home and in community  Baseline:  Goal status: INITIAL  3.  Will demonstrate improved ankle dorsiflexor endurance and reduce related fall risk as evidenced by elimination of foot slap during 3MWT  Baseline:  Goal status: INITIAL  4. Will be independent with alternative ways to control edema in BLEs (such as spiral wrapping with ACE bandages) Baseline:  Goal status: INITIAL    LONG TERM GOALS: Target date: 02/07/2022  MMT to improve by at least 1 grade in all weak groups  Baseline:  Goal status: INITIAL  2.  Will be able to ambulate at least 531f  safely in 3MWT with use of SPC vs SBQC with no more than S level assist to improve community access  Baseline:  Goal status:  INITIAL  3.  Will score at least 37 on the Berg to show improved balance/reduced fall risk  Baseline:  Goal status: INITIAL  4.  Will demonstrate improved UE strength as evidenced by ability to lift rollator in/out of car for improved community access (for extended gait distances 1022f +) Baseline:  Goal status: INITIAL  5.  Will be compliant with advanced HEP vs gym program to maintain functional gains and prevent recurrence of impairments  Baseline:  Goal status: INITIAL     PLAN: PT FREQUENCY: 2x/week  PT DURATION: 10 weeks  PLANNED INTERVENTIONS: Therapeutic exercises, Therapeutic activity, Neuromuscular re-education, Balance training, Gait training, Patient/Family education, Self Care, Joint mobilization, Stair training, DME instructions, Aquatic Therapy, Taping, Ionotophoresis '4mg'$ /ml Dexamethasone, Manual therapy, and Re-evaluation.  PLAN FOR NEXT SESSION: focus on strength, balance, endurance; interested in gym machines so would probably enjoy incorporating this into sessions. Strengthen ankle dorsiflexors, may need to consider AFOs in the future if gait/ankle strength does not improve significantly with PT. Review and perform HEP.  FOTO next visit   SRudi HeapPT, DPT 12/12/21  12:53 PM

## 2021-12-12 ENCOUNTER — Encounter (HOSPITAL_BASED_OUTPATIENT_CLINIC_OR_DEPARTMENT_OTHER): Payer: Self-pay | Admitting: Physical Therapy

## 2021-12-12 ENCOUNTER — Other Ambulatory Visit: Payer: Self-pay | Admitting: Internal Medicine

## 2021-12-12 ENCOUNTER — Ambulatory Visit (HOSPITAL_BASED_OUTPATIENT_CLINIC_OR_DEPARTMENT_OTHER): Payer: Medicare Other | Admitting: Physical Therapy

## 2021-12-12 DIAGNOSIS — R296 Repeated falls: Secondary | ICD-10-CM

## 2021-12-12 DIAGNOSIS — R2681 Unsteadiness on feet: Secondary | ICD-10-CM

## 2021-12-12 DIAGNOSIS — M6281 Muscle weakness (generalized): Secondary | ICD-10-CM

## 2021-12-12 DIAGNOSIS — M48062 Spinal stenosis, lumbar region with neurogenic claudication: Secondary | ICD-10-CM | POA: Diagnosis not present

## 2021-12-12 DIAGNOSIS — M5459 Other low back pain: Secondary | ICD-10-CM

## 2021-12-12 NOTE — Therapy (Unsigned)
OUTPATIENT PHYSICAL THERAPY TREATMENT NOTE   Patient Name: Charles Lloyd. MRN: [id number] DOB:Dec 17, 1940, 81 y.o., male Today's Date: 12/14/2021   PT End of Session - 12/14/21 1336     Visit Number 5    Number of Visits 21    Date for PT Re-Evaluation 02/07/22    Authorization Type [employer] MCR    Authorization Time Period 11/29/21 to 02/07/22    Progress Note Due on Visit 10    PT Start Time 1302    PT Stop Time 1330    PT Time Calculation (min) 28 min    Activity Tolerance Patient tolerated treatment well    Behavior During Therapy [facility] for tasks assessed/performed             Past Medical History:  Diagnosis Date   Acute tracheobronchitis 01/05/2019   B12 deficiency    Basal cell carcinoma (BCC) of left side of nose 01/28/2018   Cancer ([place])    Cardiomyopathy ([place]) 03/10/2018   With chronic atrial fibrillation   CHF (congestive heart failure) ([place]) 04/16/2013   Functional class II, ejection fraction 35-40%  Formatting of this note might be different from the original. Functional class II, ejection fraction 35-40%   Chronic anticoagulation    Chronic diastolic (congestive) heart failure ([place]) 04/16/2013   Functional class II, ejection fraction 35-40%  Last Assessment & Plan:  Clinically stable Volume well controlled meds reviewed   Chronic diastolic CHF (congestive heart failure) ([place]) 04/16/2013   Functional class II, ejection fraction 35-40%  Last Assessment & Plan:  Clinically stable Volume well controlled meds reviewed   Colon polyps 04/16/2013   Diabetic neuropathy (HCC)    Eosinophil count raised 02/17/2019   Essential hypertension 01/06/2010   Last Assessment & Plan:  Well controlled Continue med management   Gout    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  Steroid usage was discontinued several months ago.  Follow up with Dermatology as planned.   Hypercholesterolemia 01/06/2010   Last Assessment &  Plan:  Repeat labs recommended   Hyperlipidemia associated with type 2 diabetes mellitus ([place]) 01/28/2018   Hypertensive heart disease with heart failure ([place]) 01/06/2010   Last Assessment & Plan:  Well controlled Continue med management   Hyponatremia 12/17/2018   Infected prosthetic knee joint ([place]) 06/24/2017   Last Assessment & Plan:  Id following ?ongoing abx managementy reported Request records   Infection of prosthetic right knee joint ([place])    Insomnia 03/04/2012   Grief with loss of wife 02/20/12  Last Assessment & Plan:  Improved sx  contineu xanax prn Se discussed   Lesion of liver 04/20/2019   -On cardiac CT 02/2019   Mild reactive airways disease    Neoplasm of prostate 04/16/2013   Neoplasm of prostate, malignant ([place]) 01/06/2010   [name] S/p radiation therapy   Last Assessment & Plan:  Repeat psa today No urinary sx Pt reported fatigue/poor appetite   On amiodarone therapy 09/07/2013   On continuous oral anticoagulation 01/28/2018   Other activity(E029.9) 04/20/2013   Formatting of this note might be different from the original. Transthoracic Echocardiogram-03/10/[facility]: Normal left ventricular wall thickness and cavity size.  Global left ventricular systolic function is moderately reduced.  The estimated ejection fraction is 35-40%.  The left atrium is moderately enlarged.  The right atrium is mildly enlarged.  No significant valvular    Overweight (BMI 25.0-29.9) 10/22/2016   Persistent  atrial fibrillation ([place]) 04/16/2013   Last Assessment & Plan:  Rate controlled Continue med management eliquis for stroke preventino  Formatting of this note might be different from the original.  Drug  HX Current Rx Pre-ABL inefficacy Pre-ABL intolerant Post-ABL inefficacy Post-ABL intolerant max dose/24h M/Y end comments  sotalol                  dofetilide                  flecainide                  propafenone                  am   S/P TKR (total knee  replacement), bilateral    Secondary hypercoagulable state ([place]) 04/09/2019   Tinea cruris 04/16/2013   Type 2 diabetes mellitus with diabetic polyneuropathy, without long-term current use of insulin ([place]) 04/16/2013   Last Assessment & Plan:  Labs today Pt reports well controlled on ambulatory monitoring   Vitamin D deficiency 07/25/2018   Past Surgical History:  Procedure Laterality Date   ATRIAL FIBRILLATION ABLATION N/A 03/12/2019   Procedure: ATRIAL FIBRILLATION ABLATION;  Surgeon: [name], MD;  Location: [place] [facility];  Service: Cardiovascular;  Laterality: N/A;   ATRIAL FIBRILLATION ABLATION N/A 04/29/2020   Procedure: ATRIAL FIBRILLATION ABLATION;  Surgeon: [name], MD;  Location: [place] [facility];  Service: Cardiovascular;  Laterality: N/A;   CARDIAC ELECTROPHYSIOLOGY STUDY AND ABLATION     CARDIOVERSION     CARDIOVERSION N/A 10/10/2018   Procedure: CARDIOVERSION;  Surgeon: [name], MD;  Location[facility] ENDOSCOPY;  Service: Cardiovascular;  Laterality: N/A;   CARDIOVERSION N/A 05/18/2020   Procedure: CARDIOVERSION;  Surgeon: [name], MD;  Location: [place];  Service: Cardiovascular;  Laterality: N/A;   CARDIOVERSION N/A 12/09/2020   Procedure: CARDIOVERSION;  Surgeon: Lelon Perla, MD;  Location: [facility] ENDOSCOPY;  Service: Cardiovascular;  Laterality: N/A;   CHOLECYSTECTOMY     PROSTATECTOMY     REPLACEMENT TOTAL KNEE BILATERAL     Patient Active Problem List   Diagnosis Date Noted   Acute renal failure superimposed on stage 3b chronic kidney disease ([place]) 11/16/2021   Uremia 11/16/2021   Gout due to renal impairment 10/17/2021   Pancytopenia ([place]) 08/05/2021   SIRS (systemic inflammatory response syndrome) ([place]) 08/04/2021   Chronic kidney disease, stage 3b ([place]) 12/30/2020   Multiple falls    Spinal stenosis    S/P TKR (total knee replacement), bilateral    Arthralgia 11/20/2019   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   Lesion  of liver 04/20/2019   Secondary hypercoagulable state ([place]) 04/09/2019   Eosinophil count raised 02/17/2019   Acute tracheobronchitis 01/05/2019   Asthma    Vitamin D deficiency 07/25/2018   Chronic anticoagulation 03/06/2018   Hyperlipidemia associated with type 2 diabetes mellitus ([place]) 01/28/2018   Basal cell carcinoma (BCC) of left side of nose 01/28/2018   Infected prosthetic knee joint ([place]) 06/24/2017   B12 deficiency 10/22/2016   Overweight (BMI 25.0-29.9) 10/22/2016   Primary osteoarthritis of right knee 03/29/2015   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   Paroxysmal A-fib ([place]) 04/16/2013   Chronic diastolic (congestive) heart failure ([place]) 04/16/2013   Colon polyps 04/16/2013   NIDDM-2 with polyneuropathy and hyperglycemia 04/16/2013   Tinea cruris 04/16/2013   Diabetic neuropathy ([place]) 07/14/2012   Insomnia 03/04/2012   Hypertensive heart disease with heart failure ([place]) 01/06/2010  Neoplasm of prostate, malignant ([place]) 01/06/2010   Hypercholesterolemia 01/06/2010   Essential hypertension 01/06/2010    PCP: Isaac Bliss, Olam Idler   REFERRING PROVIDER: Reece Agar, MD   REFERRING DIAG: [phone] (ICD-10-CM) - Spinal stenosis, lumbar region with neurogenic claudication   Rationale for Evaluation and Treatment Rehabilitation  THERAPY DIAG:  Unsteadiness on feet  Repeated falls  Muscle weakness (generalized)  Other low back pain  ONSET DATE: 11/15/2021   SUBJECTIVE:                                                                                                                                                                                           SUBJECTIVE STATEMENT: Pt states that he walked around [place] for about an hour without requiring any seated rest breaks. He reports feeling great today with no complications.   Pt uses a SPC outside of home, but uses the rollator walking in here due to the hill outside.  Pt doesn't use any AD at home.  Pt states his  back is aggravated 1st thing in AM though not painful.  It improves as he moves and walks around.  Pt states he has a new mattress.  PERTINENT HISTORY:  Lumbar anterolisthesis, Peripheral neuropathy, TKR bilat, A-fib, CHF, DM  PAIN:  Are you having pain? Yes: NPRS scale: 2/10 Pain location: central lumbar  Pain description: dull, achy Aggravating factors: none  Relieving factors: injections    PRECAUTIONS: Fall and Other: severe neuropathy   WEIGHT BEARING RESTRICTIONS No  FALLS:  Has patient fallen in last 6 months? Yes. Number of falls 3.5 (caught himself on one fall, on the other 3 falls my legs just gave way like someone took my legs out from under me)  LIVING ENVIRONMENT: Lives with: lives alone and family is near by and can help him easily  Lives in: House/apartment Stairs:  two story townhouse with stair lift inside, no STE but does have to deal with one step going up to chair lift Has following equipment at home: Single point cane, Walker - 4 wheeled, and stair lift, raised toilet seats, shower stool   OCCUPATION: retired   PLOF: Independent, Independent with basic ADLs, and Requires assistive device for independence  PATIENT GOALS stop falling, try to gain some strength   OBJECTIVE:   DIAGNOSTIC FINDINGS:  IMPRESSION: 1. 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 2. Stable to slight progression of severe spinal stenosis and moderate neural foraminal stenosis at L2-3. 3. Slight progression of moderate spinal stenosis at L3-4.    TODAY'S TREATMENT  12/14/2021: Therapeutic Exercise:   NuStep lvl3, 5 min Ankle DF with T  band with YTB 2x10 bilat Seated marching with GTB 2x10 Seated clams with GTB 2x10 Seated LAQ with GTB 2x10  Seated heel raises 2x10 Seated toe raises x 10 and x 5   12/12/2021: Therapeutic Exercise:   NuStep lvl5, 5 min Pt was educated in correct performance and palpation of TrA contraction. Pt performed: Ankle DF with T band  with YTB 2x10 bilat Supine TrA contraction with 5 sec hold Supine marching with TrA 2x10 Seated marching with GTB 2x10 Seated clams with GTB 2x10 Seated LAQ with RTB 2x10  Seated heel raises 2x10 Seated toe raises x 10 and x 5 1 lap around clinic with Rolator and cues for Heel to toe gait pattern       Neuro Re-ed Activities:   Standing weight shifts with UE support s/s and f/b x 10 reps with SBA   Standing with FT without UE support 2x30 sec with SBA   Staggered stance without UE support except occasions of UE support with L UE back x 30 sec    Airex standing with FA x30 sec and FT x 30 sec  Therapeutic Exercise:   Reviewed and performed HEP. Pt was educated in correct performance and palpation of TrA contraction. Pt performed: Ankle DF with T band with YTB 2x10 bilat Supine TrA contraction with 5 sec hold Supine marching with TrA 2x10 Seated marching with GTB 2x10 Seated clams with GTB 2x10 Seated LAQ with RTB 2x10  Seated heel raises 2x10 Seated toe raises x 10 and x 5      Neuro Re-ed Activities:   Standing weight shifts with UE support s/s and f/b x 10 reps with SBA   Standing with FT without UE support 2x30 sec with SBA   Staggered stance without UE support except occasions of UE support with L UE back x 30 sec    Airex standing with FA x30 sec and FT x 30 sec  PATIENT EDUCATION:  Education details: Pt received a HEP.  Exercise form, rationale of exercises, and POC.  Person educated: Patient and Child(ren) Education method: Explanation Education comprehension: verbalized understanding, returned demonstration, and needs further education   HOME EXERCISE PROGRAM: Access Code: [email or web address] URL: [email or web address] Date: 12/01/2021 Prepared by: Ronny Flurry  Exercises - Supine Transversus Abdominis Bracing - Hands on Stomach  - 2 x daily - 7 x weekly - 2 sets - 10 reps - Supine March  - 1-2 x daily - 7 x weekly - 2 sets - 5-10 reps - Seated Hip  Abduction with Resistance  - 1 x daily - 4 x weekly - 2 sets - 10 reps - Seated Knee Extension with Resistance  - 1 x daily - 3-4 x weekly - 2 sets - 10 reps - Seated March  - 1 x daily - 3-4 x weekly - 2 sets - 10 reps - Seated Heel Raise  - 1 x daily - 7 x weekly - 2 sets - 10 reps  ASSESSMENT:  CLINICAL IMPRESSION: Pt performed exercises well with cuing and instruction in correct form. Pt continues to demonstrate the most limitation with DF, significantly limited on R. Plan to incorporate NMES next session. Session truncated today due to pt requesting to leave early to pick up granddaughter. Pt should benefit from skilled PT services to address impairments and goals and to improve function.  OBJECTIVE IMPAIRMENTS Abnormal gait, cardiopulmonary status limiting activity, decreased balance, decreased coordination, decreased knowledge of use of DME, decreased mobility, difficulty walking, decreased strength, decreased safety  awareness, increased edema, and impaired UE functional use.   ACTIVITY LIMITATIONS standing, squatting, stairs, transfers, bed mobility, reach over head, and locomotion level  PARTICIPATION LIMITATIONS: shopping, community activity, and yard work  PERSONAL FACTORS Age, Behavior pattern, Fitness, Past/current experiences, and Time since onset of injury/illness/exacerbation are also affecting patient's functional outcome.   REHAB POTENTIAL: Good  CLINICAL DECISION MAKING: Evolving/moderate complexity  EVALUATION COMPLEXITY: Moderate   GOALS: Goals reviewed with patient? Yes  SHORT TERM GOALS: Target date: 01/03/2022  Will be compliant with appropriate progressive HEP to include walking vs floor bike program  Baseline: Goal status: INITIAL  2.  Will be able to name 3 ways to reduce fall risk at home and in community  Baseline:  Goal status: INITIAL  3.  Will demonstrate improved ankle dorsiflexor endurance and reduce related fall risk as evidenced by elimination  of foot slap during 3MWT  Baseline:  Goal status: INITIAL  4. Will be independent with alternative ways to control edema in BLEs (such as spiral wrapping with ACE bandages) Baseline:  Goal status: INITIAL    LONG TERM GOALS: Target date: 02/07/2022  MMT to improve by at least 1 grade in all weak groups  Baseline:  Goal status: INITIAL  2.  Will be able to ambulate at least 519f safely in 3MWT with use of SPC vs SBQC with no more than S level assist to improve community access  Baseline:  Goal status: INITIAL  3.  Will score at least 37 on the Berg to show improved balance/reduced fall risk  Baseline:  Goal status: INITIAL  4.  Will demonstrate improved UE strength as evidenced by ability to lift rollator in/out of car for improved community access (for extended gait distances [id number]+) Baseline:  Goal status: INITIAL  5.  Will be compliant with advanced HEP vs gym program to maintain functional gains and prevent recurrence of impairments  Baseline:  Goal status: INITIAL     PLAN: PT FREQUENCY: 2x/week  PT DURATION: 10 weeks  PLANNED INTERVENTIONS: Therapeutic exercises, Therapeutic activity, Neuromuscular re-education, Balance training, Gait training, Patient/Family education, Self Care, Joint mobilization, Stair training, DME instructions, Aquatic Therapy, Taping, Ionotophoresis '4mg'$ /ml Dexamethasone, Manual therapy, and Re-evaluation.  PLAN FOR NEXT SESSION: focus on strength, balance, endurance; interested in gym machines so would probably enjoy incorporating this into sessions. Strengthen ankle dorsiflexors, may need to consider AFOs in the future if gait/ankle strength does not improve significantly with PT.   SiRudi HeapT, DPT 12/14/21  1:38 PM

## 2021-12-14 ENCOUNTER — Ambulatory Visit (HOSPITAL_BASED_OUTPATIENT_CLINIC_OR_DEPARTMENT_OTHER): Payer: Medicare Other | Admitting: Physical Therapy

## 2021-12-14 ENCOUNTER — Encounter (HOSPITAL_BASED_OUTPATIENT_CLINIC_OR_DEPARTMENT_OTHER): Payer: Self-pay | Admitting: Physical Therapy

## 2021-12-14 DIAGNOSIS — M5459 Other low back pain: Secondary | ICD-10-CM

## 2021-12-14 DIAGNOSIS — M48062 Spinal stenosis, lumbar region with neurogenic claudication: Secondary | ICD-10-CM | POA: Diagnosis not present

## 2021-12-14 DIAGNOSIS — M6281 Muscle weakness (generalized): Secondary | ICD-10-CM

## 2021-12-14 DIAGNOSIS — R296 Repeated falls: Secondary | ICD-10-CM

## 2021-12-14 DIAGNOSIS — R2681 Unsteadiness on feet: Secondary | ICD-10-CM

## 2021-12-18 NOTE — Therapy (Signed)
OUTPATIENT PHYSICAL THERAPY TREATMENT NOTE   Patient Name: Charles Lloyd. MRN: [id number] DOB:[birth year], 81 y.o., male Today's Date: 12/19/2021   PT End of Session - 12/19/21 1255     Visit Number 6    Number of Visits 21    Date for PT Re-Evaluation 02/07/22    Authorization Type [employer] MCR    Authorization Time Period 11/29/21 to 02/07/22    Progress Note Due on Visit 10    PT Start Time 1300    PT Stop Time 1340    PT Time Calculation (min) 40 min    Activity Tolerance Patient tolerated treatment well    Behavior During Therapy [facility] for tasks assessed/performed              Past Medical History:  Diagnosis Date   Acute tracheobronchitis 01/05/2019   B12 deficiency    Basal cell carcinoma (BCC) of left side of nose 01/28/2018   Cancer ([place])    Cardiomyopathy ([place]) 03/10/2018   With chronic atrial fibrillation   CHF (congestive heart failure) ([place]) 04/16/2013   Functional class II, ejection fraction 35-40%  Formatting of this note might be different from the original. Functional class II, ejection fraction 35-40%   Chronic anticoagulation    Chronic diastolic (congestive) heart failure ([place]) 04/16/2013   Functional class II, ejection fraction 35-40%  Last Assessment & Plan:  Clinically stable Volume well controlled meds reviewed   Chronic diastolic CHF (congestive heart failure) ([place]) 04/16/2013   Functional class II, ejection fraction 35-40%  Last Assessment & Plan:  Clinically stable Volume well controlled meds reviewed   Colon polyps 04/16/2013   Diabetic neuropathy (HCC)    Eosinophil count raised 02/17/2019   Essential hypertension 01/06/2010   Last Assessment & Plan:  Well controlled Continue med management   Gout    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  Steroid usage was discontinued several months ago.  Follow up with Dermatology as planned.   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   Last Assessment & Plan:  Id following ?ongoing abx managementy reported Request records   Infection of prosthetic right knee joint ([place])    Insomnia 03/04/2012   Grief with loss of wife 02/20/12  Last Assessment & Plan:  Improved sx  contineu xanax prn Se discussed   Lesion of liver 04/20/2019   -On cardiac CT 02/2019   Mild reactive airways disease    Neoplasm of prostate 04/16/2013   Neoplasm of prostate, malignant ([place]) 01/06/2010   [name] S/p radiation therapy   Last Assessment & Plan:  Repeat psa today No urinary sx Pt reported fatigue/poor appetite   On amiodarone therapy 09/07/2013   On continuous oral anticoagulation 01/28/2018   Other activity(E029.9) 04/20/2013   Formatting of this note might be different from the original. Transthoracic Echocardiogram-03/10/[facility]: Normal left ventricular wall thickness and cavity size.  Global left ventricular systolic function is moderately reduced.  The estimated ejection fraction is 35-40%.  The left atrium is moderately enlarged.  The right atrium is mildly enlarged.  No significant valvular    Overweight (BMI 25.0-29.9) 10/22/2016  Persistent atrial fibrillation ([place]) 04/16/2013   Last Assessment & Plan:  Rate controlled Continue med management eliquis for stroke preventino  Formatting of this note might be different from the original.  Drug  HX Current Rx Pre-ABL inefficacy Pre-ABL intolerant Post-ABL inefficacy Post-ABL intolerant max dose/24h M/Y end comments  sotalol                  dofetilide                  flecainide                  propafenone                  am   S/P TKR (total knee  replacement), bilateral    Secondary hypercoagulable state ([place]) 04/09/2019   Tinea cruris 04/16/2013   Type 2 diabetes mellitus with diabetic polyneuropathy, without long-term current use of insulin ([place]) 04/16/2013   Last Assessment & Plan:  Labs today Pt reports well controlled on ambulatory monitoring   Vitamin D deficiency 07/25/2018   Past Surgical History:  Procedure Laterality Date   ATRIAL FIBRILLATION ABLATION N/A 03/12/2019   Procedure: ATRIAL FIBRILLATION ABLATION;  Surgeon: [name], MD;  Location: [place];  Service: Cardiovascular;  Laterality: N/A;   ATRIAL FIBRILLATION ABLATION N/A 04/29/2020   Procedure: ATRIAL FIBRILLATION ABLATION;  Surgeon: [name], MD;  Location: [place] [facility];  Service: Cardiovascular;  Laterality: N/A;   CARDIAC ELECTROPHYSIOLOGY STUDY AND ABLATION     CARDIOVERSION     CARDIOVERSION N/A 10/10/2018   Procedure: CARDIOVERSION;  Surgeon: [name], MD;  Location: [facility] ENDOSCOPY;  Service: Cardiovascular;  Laterality: N/A;   CARDIOVERSION N/A 05/18/2020   Procedure: CARDIOVERSION;  Surgeon: [name], MD;  Location: [place];  Service: Cardiovascular;  Laterality: N/A;   CARDIOVERSION N/A 12/09/2020   Procedure: CARDIOVERSION;  Surgeon: Lelon Perla, MD;  Location: [facility] ENDOSCOPY;  Service: Cardiovascular;  Laterality: N/A;   CHOLECYSTECTOMY     PROSTATECTOMY     REPLACEMENT TOTAL KNEE BILATERAL     Patient Active Problem List   Diagnosis Date Noted   Acute renal failure superimposed on stage 3b chronic kidney disease ([place]) 11/16/2021   Uremia 11/16/2021   Gout due to renal impairment 10/17/2021   Pancytopenia ([place]) 08/05/2021   SIRS (systemic inflammatory response syndrome) ([place]) 08/04/2021   Chronic kidney disease, stage 3b ([place]) 12/30/2020   Multiple falls    Spinal stenosis    S/P TKR (total knee replacement), bilateral    Arthralgia 11/20/2019   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   Lesion  of liver 04/20/2019   Secondary hypercoagulable state ([place]) 04/09/2019   Eosinophil count raised 02/17/2019   Acute tracheobronchitis 01/05/2019   Asthma    Vitamin D deficiency 07/25/2018   Chronic anticoagulation 03/06/2018   Hyperlipidemia associated with type 2 diabetes mellitus ([place]) 01/28/2018   Basal cell carcinoma (BCC) of left side of nose 01/28/2018   Infected prosthetic knee joint ([place]) 06/24/2017   B12 deficiency 10/22/2016   Overweight (BMI 25.0-29.9) 10/22/2016   Primary osteoarthritis of right knee 03/29/2015   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   Paroxysmal A-fib ([place]) 04/16/2013   Chronic diastolic (congestive) heart failure ([place]) 04/16/2013   Colon polyps 04/16/2013   NIDDM-2 with polyneuropathy and hyperglycemia 04/16/2013   Tinea cruris 04/16/2013   Diabetic neuropathy ([place]) 07/14/2012   Insomnia 03/04/2012   Hypertensive heart disease with heart failure ([place]) 01/06/2010  Neoplasm of prostate, malignant ([place]) 01/06/2010   Hypercholesterolemia 01/06/2010   Essential hypertension 01/06/2010    PCP: Isaac Bliss, Olam Idler   REFERRING PROVIDER: Reece Agar, MD   REFERRING DIAG: [phone] (ICD-10-CM) - Spinal stenosis, lumbar region with neurogenic claudication   Rationale for Evaluation and Treatment Rehabilitation  THERAPY DIAG:  Unsteadiness on feet  Repeated falls  Muscle weakness (generalized)  Other low back pain  ONSET DATE: 11/15/2021   SUBJECTIVE:                                                                                                                                                                                           SUBJECTIVE STATEMENT: Pt states that he had an increase in lower back pain the past two days. He reports it being like a toothache. "It comes back on every few months". I think its from changing my shoes, since I have changed back it is feeling better.   Pt uses a SPC outside of home, but uses the rollator walking in  here due to the hill outside.  Pt doesn't use any AD at home.  Pt states his back is aggravated 1st thing in AM though not painful.  It improves as he moves and walks around.  Pt states he has a new mattress.  PERTINENT HISTORY:  Lumbar anterolisthesis, Peripheral neuropathy, TKR bilat, A-fib, CHF, DM  PAIN:  Are you having pain? Yes: NPRS scale: 2/10 Pain location: central lumbar  Pain description: dull, achy Aggravating factors: none  Relieving factors: injections   FOTO: 12/19/2021: 56%    PRECAUTIONS: Fall and Other: severe neuropathy   WEIGHT BEARING RESTRICTIONS No  FALLS:  Has patient fallen in last 6 months? Yes. Number of falls 3.5 (caught himself on one fall, on the other 3 falls my legs just gave way like someone took my legs out from under me)  LIVING ENVIRONMENT: Lives with: lives alone and family is near by and can help him easily  Lives in: House/apartment Stairs:  two story townhouse with stair lift inside, no STE but does have to deal with one step going up to chair lift Has following equipment at home: Single point cane, Walker - 4 wheeled, and stair lift, raised toilet seats, shower stool   OCCUPATION: retired   PLOF: Independent, Independent with basic ADLs, and Requires assistive device for independence  PATIENT GOALS stop falling, try to gain some strength   OBJECTIVE:   DIAGNOSTIC FINDINGS:  IMPRESSION: 1. 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 2. Stable to slight progression of severe spinal stenosis and moderate neural foraminal stenosis at L2-3. 3. Slight progression  of moderate spinal stenosis at L3-4.    TODAY'S TREATMENT  12/18/2021: Therapeutic Exercise:   NuStep lvl4, 6 min [place] 10/10 on/off, 50% duty cycle, 5 sec ramp up, 84 mA CC for 8 min on R anterior tib.  Ankle DF with T band with YTB 1x10 bilat - increased fatigue from russian. Pt demonstrating increased difficulty today.  Eccentric lowering from DF with PT  assist into DF x20 Eccentric control with LAQ Seated heel/ toe raises x20   12/14/2021: Therapeutic Exercise:   NuStep lvl3, 5 min Ankle DF with T band with YTB 2x10 bilat Seated marching with GTB 2x10 Seated clams with GTB 2x10 Seated LAQ with GTB 2x10  Seated heel raises 2x10 Seated toe raises x 10 and x 5   12/12/2021: Therapeutic Exercise:   NuStep lvl5, 5 min Pt was educated in correct performance and palpation of TrA contraction. Pt performed: Ankle DF with T band with YTB 2x10 bilat Supine TrA contraction with 5 sec hold Supine marching with TrA 2x10 Seated marching with GTB 2x10 Seated clams with GTB 2x10 Seated LAQ with RTB 2x10  Seated heel raises 2x10 Seated toe raises x 10 and x 5 1 lap around clinic with Rolator and cues for Heel to toe gait pattern       Neuro Re-ed Activities:   Standing weight shifts with UE support s/s and f/b x 10 reps with SBA   Standing with FT without UE support 2x30 sec with SBA   Staggered stance without UE support except occasions of UE support with L UE back x 30 sec    Airex standing with FA x30 sec and FT x 30 sec  Therapeutic Exercise:   Reviewed and performed HEP. Pt was educated in correct performance and palpation of TrA contraction. Pt performed: Ankle DF with T band with YTB 2x10 bilat Supine TrA contraction with 5 sec hold Supine marching with TrA 2x10 Seated marching with GTB 2x10 Seated clams with GTB 2x10 Seated LAQ with RTB 2x10  Seated heel raises 2x10 Seated toe raises x 10 and x 5      Neuro Re-ed Activities:   Standing weight shifts with UE support s/s and f/b x 10 reps with SBA   Standing with FT without UE support 2x30 sec with SBA   Staggered stance without UE support except occasions of UE support with L UE back x 30 sec    Airex standing with FA x30 sec and FT x 30 sec  PATIENT EDUCATION:  Education details: Pt received a HEP.  Exercise form, rationale of exercises, and POC.  Person educated:  Patient and Child(ren) Education method: Explanation Education comprehension: verbalized understanding, returned demonstration, and needs further education   HOME EXERCISE PROGRAM: Access Code: [email or web address] URL: [email or web address] Date: 12/01/2021 Prepared by: Ronny Flurry  Exercises - Supine Transversus Abdominis Bracing - Hands on Stomach  - 2 x daily - 7 x weekly - 2 sets - 10 reps - Supine March  - 1-2 x daily - 7 x weekly - 2 sets - 5-10 reps - Seated Hip Abduction with Resistance  - 1 x daily - 4 x weekly - 2 sets - 10 reps - Seated Knee Extension with Resistance  - 1 x daily - 3-4 x weekly - 2 sets - 10 reps - Seated March  - 1 x daily - 3-4 x weekly - 2 sets - 10 reps - Seated Heel Raise  - 1 x daily - 7 x weekly - 2  sets - 10 reps  ASSESSMENT:  CLINICAL IMPRESSION: Pt performed exercises well with cuing and instruction in correct form. Attempted [place] to his R anterior tib with good response but fatigue noted with increased time. Discussed utilization of a DF assist strap to help with active walking and energy consumption.  Remainder of session with focus on anterior tib and quad strengthening. Pt should benefit from skilled PT services to address impairments and goals and to improve function.  OBJECTIVE IMPAIRMENTS Abnormal gait, cardiopulmonary status limiting activity, decreased balance, decreased coordination, decreased knowledge of use of DME, decreased mobility, difficulty walking, decreased strength, decreased safety awareness, increased edema, and impaired UE functional use.   ACTIVITY LIMITATIONS standing, squatting, stairs, transfers, bed mobility, reach over head, and locomotion level  PARTICIPATION LIMITATIONS: shopping, community activity, and yard work  PERSONAL FACTORS Age, Behavior pattern, Fitness, Past/current experiences, and Time since onset of injury/illness/exacerbation are also affecting patient's functional outcome.   REHAB POTENTIAL:  Good  CLINICAL DECISION MAKING: Evolving/moderate complexity  EVALUATION COMPLEXITY: Moderate   GOALS: Goals reviewed with patient? Yes  SHORT TERM GOALS: Target date: 01/03/2022  Will be compliant with appropriate progressive HEP to include walking vs floor bike program  Baseline: Goal status: INITIAL  2.  Will be able to name 3 ways to reduce fall risk at home and in community  Baseline:  Goal status: INITIAL  3.  Will demonstrate improved ankle dorsiflexor endurance and reduce related fall risk as evidenced by elimination of foot slap during 3MWT  Baseline:  Goal status: INITIAL  4. Will be independent with alternative ways to control edema in BLEs (such as spiral wrapping with ACE bandages) Baseline:  Goal status: INITIAL    LONG TERM GOALS: Target date: 02/07/2022  MMT to improve by at least 1 grade in all weak groups  Baseline:  Goal status: INITIAL  2.  Will be able to ambulate at least 527f safely in 3MWT with use of SPC vs SBQC with no more than S level assist to improve community access  Baseline:  Goal status: INITIAL  3.  Will score at least 37 on the Berg to show improved balance/reduced fall risk  Baseline:  Goal status: INITIAL  4.  Will demonstrate improved UE strength as evidenced by ability to lift rollator in/out of car for improved community access (for extended gait distances 10027f+) Baseline:  Goal status: INITIAL  5.  Will be compliant with advanced HEP vs gym program to maintain functional gains and prevent recurrence of impairments  Baseline:  Goal status: INITIAL     PLAN: PT FREQUENCY: 2x/week  PT DURATION: 10 weeks  PLANNED INTERVENTIONS: Therapeutic exercises, Therapeutic activity, Neuromuscular re-education, Balance training, Gait training, Patient/Family education, Self Care, Joint mobilization, Stair training, DME instructions, Aquatic Therapy, Taping, Ionotophoresis '4mg'$ /ml Dexamethasone, Manual therapy, and  Re-evaluation.  PLAN FOR NEXT SESSION: focus on strength, balance, endurance; interested in gym machines so would probably enjoy incorporating this into sessions. Strengthen ankle dorsiflexors, may need to consider AFOs in the future if gait/ankle strength does not improve significantly with PT.   SiRudi HeapT, DPT 12/19/21  12:57 PM

## 2021-12-19 ENCOUNTER — Encounter (HOSPITAL_BASED_OUTPATIENT_CLINIC_OR_DEPARTMENT_OTHER): Payer: Self-pay | Admitting: Physical Therapy

## 2021-12-19 ENCOUNTER — Ambulatory Visit (HOSPITAL_BASED_OUTPATIENT_CLINIC_OR_DEPARTMENT_OTHER): Payer: Medicare Other | Admitting: Physical Therapy

## 2021-12-19 DIAGNOSIS — R2681 Unsteadiness on feet: Secondary | ICD-10-CM

## 2021-12-19 DIAGNOSIS — M5459 Other low back pain: Secondary | ICD-10-CM

## 2021-12-19 DIAGNOSIS — R296 Repeated falls: Secondary | ICD-10-CM

## 2021-12-19 DIAGNOSIS — M48062 Spinal stenosis, lumbar region with neurogenic claudication: Secondary | ICD-10-CM | POA: Diagnosis not present

## 2021-12-19 DIAGNOSIS — M6281 Muscle weakness (generalized): Secondary | ICD-10-CM

## 2021-12-21 ENCOUNTER — Encounter (HOSPITAL_BASED_OUTPATIENT_CLINIC_OR_DEPARTMENT_OTHER): Payer: Self-pay | Admitting: Physical Therapy

## 2021-12-21 ENCOUNTER — Ambulatory Visit (HOSPITAL_BASED_OUTPATIENT_CLINIC_OR_DEPARTMENT_OTHER): Payer: Medicare Other | Admitting: Physical Therapy

## 2021-12-21 DIAGNOSIS — R296 Repeated falls: Secondary | ICD-10-CM

## 2021-12-21 DIAGNOSIS — M6281 Muscle weakness (generalized): Secondary | ICD-10-CM

## 2021-12-21 DIAGNOSIS — M48062 Spinal stenosis, lumbar region with neurogenic claudication: Secondary | ICD-10-CM | POA: Diagnosis not present

## 2021-12-21 DIAGNOSIS — R2681 Unsteadiness on feet: Secondary | ICD-10-CM

## 2021-12-21 DIAGNOSIS — M5459 Other low back pain: Secondary | ICD-10-CM

## 2021-12-21 NOTE — Therapy (Signed)
OUTPATIENT PHYSICAL THERAPY TREATMENT NOTE   Patient Name: Charles Lloyd. MRN: [id number] DOB:[birth year], 81 y.o., male Today's Date: 12/21/2021   PT End of Session - 12/21/21 1354     Visit Number 7    Number of Visits 21    Date for PT Re-Evaluation 02/07/22    Authorization Type [employer] MCR    Authorization Time Period 11/29/21 to 02/07/22    Progress Note Due on Visit 10    PT Start Time 1305    PT Stop Time 1347    PT Time Calculation (min) 42 min    Activity Tolerance Patient tolerated treatment well    Behavior During Therapy [facility] for tasks assessed/performed               Past Medical History:  Diagnosis Date   Acute tracheobronchitis 01/05/2019   B12 deficiency    Basal cell carcinoma (BCC) of left side of nose 01/28/2018   Cancer ([place])    Cardiomyopathy ([place]) 03/10/2018   With chronic atrial fibrillation   CHF (congestive heart failure) ([place]) 04/16/2013   Functional class II, ejection fraction 35-40%  Formatting of this note might be different from the original. Functional class II, ejection fraction 35-40%   Chronic anticoagulation    Chronic diastolic (congestive) heart failure ([place]) 04/16/2013   Functional class II, ejection fraction 35-40%  Last Assessment & Plan:  Clinically stable Volume well controlled meds reviewed   Chronic diastolic CHF (congestive heart failure) ([place]) 04/16/2013   Functional class II, ejection fraction 35-40%  Last Assessment & Plan:  Clinically stable Volume well controlled meds reviewed   Colon polyps 04/16/2013   Diabetic neuropathy (HCC)    Eosinophil count raised 02/17/2019   Essential hypertension 01/06/2010   Last Assessment & Plan:  Well controlled Continue med management   Gout    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  Steroid usage was discontinued several months ago.  Follow up with Dermatology as planned.   Hypercholesterolemia 01/06/2010   Last  Assessment & Plan:  Repeat labs recommended   Hyperlipidemia associated with type 2 diabetes mellitus ([place]) 01/28/2018   Hypertensive heart disease with heart failure ([place]) 01/06/2010   Last Assessment & Plan:  Well controlled Continue med management   Hyponatremia 12/17/2018   Infected prosthetic knee joint ([place]) 06/24/2017   Last Assessment & Plan:  Id following ?ongoing abx managementy reported Request records   Infection of prosthetic right knee joint ([place])    Insomnia 03/04/2012   Grief with loss of wife 02/20/12  Last Assessment & Plan:  Improved sx  contineu xanax prn Se discussed   Lesion of liver 04/20/2019   -On cardiac CT 02/2019   Mild reactive airways disease    Neoplasm of prostate 04/16/2013   Neoplasm of prostate, malignant ([place]) 01/06/2010   [name] S/p radiation therapy   Last Assessment & Plan:  Repeat psa today No urinary sx Pt reported fatigue/poor appetite   On amiodarone therapy 09/07/2013   On continuous oral anticoagulation 01/28/2018   Other activity(E029.9) 04/20/2013   Formatting of this note might be different from the original. Transthoracic Echocardiogram-03/10/2013-[facility]: Normal left ventricular wall thickness and cavity size.  Global left ventricular systolic function is moderately reduced.  The estimated ejection fraction is 35-40%.  The left atrium is moderately enlarged.  The right atrium is mildly enlarged.  No significant valvular    Overweight (BMI 25.0-29.9) 10/22/2016  Persistent atrial fibrillation ([place]) 04/16/2013   Last Assessment & Plan:  Rate controlled Continue med management eliquis for stroke preventino  Formatting of this note might be different from the original.  Drug  HX Current Rx Pre-ABL inefficacy Pre-ABL intolerant Post-ABL inefficacy Post-ABL intolerant max dose/24h M/Y end comments  sotalol                  dofetilide                  flecainide                  propafenone                  am   S/P TKR (total knee  replacement), bilateral    Secondary hypercoagulable state ([place]) 04/09/2019   Tinea cruris 04/16/2013   Type 2 diabetes mellitus with diabetic polyneuropathy, without long-term current use of insulin ([place]) 04/16/2013   Last Assessment & Plan:  Labs today Pt reports well controlled on ambulatory monitoring   Vitamin D deficiency 07/25/2018   Past Surgical History:  Procedure Laterality Date   ATRIAL FIBRILLATION ABLATION N/A 03/12/2019   Procedure: ATRIAL FIBRILLATION ABLATION;  Surgeon: [name], MD;  Location: [place] [facility];  Service: Cardiovascular;  Laterality: N/A;   ATRIAL FIBRILLATION ABLATION N/A 04/29/2020   Procedure: ATRIAL FIBRILLATION ABLATION;  Surgeon: [name], MD;  Location: [place] [facility];  Service: Cardiovascular;  Laterality: N/A;   CARDIAC ELECTROPHYSIOLOGY STUDY AND ABLATION     CARDIOVERSION     CARDIOVERSION N/A 10/10/2018   Procedure: CARDIOVERSION;  Surgeon: [name], MD;  Location: [facility] ENDOSCOPY;  Service: Cardiovascular;  Laterality: N/A;   CARDIOVERSION N/A 05/18/2020   Procedure: CARDIOVERSION;  Surgeon: [name], MD;  Location: [place];  Service: Cardiovascular;  Laterality: N/A;   CARDIOVERSION N/A 12/09/2020   Procedure: CARDIOVERSION;  Surgeon: Lelon Perla, MD;  Location: [facility] ENDOSCOPY;  Service: Cardiovascular;  Laterality: N/A;   CHOLECYSTECTOMY     PROSTATECTOMY     REPLACEMENT TOTAL KNEE BILATERAL     Patient Active Problem List   Diagnosis Date Noted   Acute renal failure superimposed on stage 3b chronic kidney disease ([place]) 11/16/2021   Uremia 11/16/2021   Gout due to renal impairment 10/17/2021   Pancytopenia ([place]) 08/05/2021   SIRS (systemic inflammatory response syndrome) ([place]) 08/04/2021   Chronic kidney disease, stage 3b ([place]) 12/30/2020   Multiple falls    Spinal stenosis    S/P TKR (total knee replacement), bilateral    Arthralgia 11/20/2019   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   Lesion  of liver 04/20/2019   Secondary hypercoagulable state ([place]) 04/09/2019   Eosinophil count raised 02/17/2019   Acute tracheobronchitis 01/05/2019   Asthma    Vitamin D deficiency 07/25/2018   Chronic anticoagulation 03/06/2018   Hyperlipidemia associated with type 2 diabetes mellitus ([place]) 01/28/2018   Basal cell carcinoma (BCC) of left side of nose 01/28/2018   Infected prosthetic knee joint ([place]) 06/24/2017   B12 deficiency 10/22/2016   Overweight (BMI 25.0-29.9) 10/22/2016   Primary osteoarthritis of right knee 03/29/2015   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   Paroxysmal A-fib ([place]) 04/16/2013   Chronic diastolic (congestive) heart failure ([place]) 04/16/2013   Colon polyps 04/16/2013   NIDDM-2 with polyneuropathy and hyperglycemia 04/16/2013   Tinea cruris 04/16/2013   Diabetic neuropathy ([place]) 07/14/2012   Insomnia 03/04/2012   Hypertensive heart disease with heart failure ([place]) 01/06/2010  Neoplasm of prostate, malignant ([place]) 01/06/2010   Hypercholesterolemia 01/06/2010   Essential hypertension 01/06/2010    PCP: Isaac Bliss, [facility]lam Idler   REFERRING PROVIDER: Reece Agar, MD   REFERRING DIAG: [phone] (ICD-10-CM) - Spinal stenosis, lumbar region with neurogenic claudication   Rationale for Evaluation and Treatment Rehabilitation  THERAPY DIAG:  Unsteadiness on feet  Repeated falls  Muscle weakness (generalized)  Other low back pain  ONSET DATE: 11/15/2021   SUBJECTIVE:                                                                                                                                                                                           SUBJECTIVE STATEMENT: Pt states his back has been bothering him some but he doesn't think it's from the exercises.  Pt states it's not bad, but feels like a toothache.  Pt states his last injection was 3 months ago.  Pt reports improved swelling in ankles.  Pt denies any adverse effects after prior Rx.  Pt reports  compliance with HEP.  Pt states his feet are getting stronger.  Pt uses a SPC outside of home, but uses the rollator walking in here due to the hill outside.  Pt doesn't use any AD at home.   PERTINENT HISTORY:  Lumbar anterolisthesis, Peripheral neuropathy, TKR bilat, A-fib, CHF, DM  PAIN:  Are you having pain? Yes: NPRS scale: 1 /10 Pain location: central lumbar  Pain description: dull, achy Aggravating factors: none  Relieving factors: injections   FOTO: 12/19/2021: 56%    PRECAUTIONS: Fall and Other: severe neuropathy   WEIGHT BEARING RESTRICTIONS No  FALLS:  Has patient fallen in last 6 months? Yes. Number of falls 3.5 (caught himself on one fall, on the other 3 falls my legs just gave way like someone took my legs out from under me)  LIVING ENVIRONMENT: Lives with: lives alone and family is near by and can help him easily  Lives in: House/apartment Stairs:  two story townhouse with stair lift inside, no STE but does have to deal with one step going up to chair lift Has following equipment at home: Single point cane, Walker - 4 wheeled, and stair lift, raised toilet seats, shower stool   OCCUPATION: retired   PLOF: Independent, Independent with basic ADLs, and Requires assistive device for independence  PATIENT GOALS stop falling, try to gain some strength   OBJECTIVE:   DIAGNOSTIC FINDINGS:  IMPRESSION: 1. 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 2. Stable to slight progression of severe spinal stenosis and moderate neural foraminal stenosis at L2-3. 3. Slight progression of moderate spinal stenosis at L3-4.  TODAY'S TREATMENT   Pt performed: Seated heel raises 2x10 Seated toe raises 2 x 10 Seated BAPS f/b approx 20-25 reps  Ankle DF with T band with YTB 2x10 on L, 1x10 on L bilat  Seated LAQ with GTB x10, 3# x 20 bilat  R ankle DF in L S/L'ing x20 reps  Nustep L3-4 x 4 mins with bilat UE/LEs  Neuro Re-ed Activities:            Standing with  FT without UE support x30 sec on floor and 2x30 sec on airex            Staggered stance with occasional UE support with L UE back 2x20 sec    Marching on airex 2x10 reps with UE support        PATIENT EDUCATION:  Education details: Exercise form, rationale of exercises, and POC.  Person educated: Patient and Child(ren) Education method: Explanation Education comprehension: verbalized understanding, returned demonstration, and needs further education   HOME EXERCISE PROGRAM: Access Code: [email or web address] URL: [email or web address] Date: 12/01/2021 Prepared by: Ronny Flurry  Exercises - Supine Transversus Abdominis Bracing - Hands on Stomach  - 2 x daily - 7 x weekly - 2 sets - 10 reps - Supine March  - 1-2 x daily - 7 x weekly - 2 sets - 5-10 reps - Seated Hip Abduction with Resistance  - 1 x daily - 4 x weekly - 2 sets - 10 reps - Seated Knee Extension with Resistance  - 1 x daily - 3-4 x weekly - 2 sets - 10 reps - Seated March  - 1 x daily - 3-4 x weekly - 2 sets - 10 reps - Seated Heel Raise  - 1 x daily - 7 x weekly - 2 sets - 10 reps  ASSESSMENT:  CLINICAL IMPRESSION: Pt performed exercises well with cuing and instruction in correct form.  Pt reports he has had some back pain lately though not bad.  Pt has decreased DF ROM and strength in bilat ankles, worse on R.  His anterior tib fatigues quickly with DF exercises which is worse on R.  Attempted S/L'ing DF for R ankle in order to perform DF in a gravity minimized position to promote increased DF.  Pt was able to perform though did fatigue with that exercise also.  Pt was able to perform standing with FT without support well though was challenged with other balance exercises requiring UE support.  He responded well to Rx having no pain after Rx though was fatigued.  Nustep was performed at the end of the Rx and he was only able to perform 4 mins due to LE fatigue.  He should benefit from cont skilled PT services to address  goals and improve overall function.    OBJECTIVE IMPAIRMENTS Abnormal gait, cardiopulmonary status limiting activity, decreased balance, decreased coordination, decreased knowledge of use of DME, decreased mobility, difficulty walking, decreased strength, decreased safety awareness, increased edema, and impaired UE functional use.   ACTIVITY LIMITATIONS standing, squatting, stairs, transfers, bed mobility, reach over head, and locomotion level  PARTICIPATION LIMITATIONS: shopping, community activity, and yard work  PERSONAL FACTORS Age, Behavior pattern, Fitness, Past/current experiences, and Time since onset of injury/illness/exacerbation are also affecting patient's functional outcome.   REHAB POTENTIAL: Good  CLINICAL DECISION MAKING: Evolving/moderate complexity  EVALUATION COMPLEXITY: Moderate   GOALS: Goals reviewed with patient? Yes  SHORT TERM GOALS: Target date: 01/03/2022  Will be compliant with appropriate progressive HEP to include walking vs floor  bike program  Baseline: Goal status: INITIAL  2.  Will be able to name 3 ways to reduce fall risk at home and in community  Baseline:  Goal status: INITIAL  3.  Will demonstrate improved ankle dorsiflexor endurance and reduce related fall risk as evidenced by elimination of foot slap during 3MWT  Baseline:  Goal status: INITIAL  4. Will be independent with alternative ways to control edema in BLEs (such as spiral wrapping with ACE bandages) Baseline:  Goal status: INITIAL    LONG TERM GOALS: Target date: 02/07/2022  MMT to improve by at least 1 grade in all weak groups  Baseline:  Goal status: INITIAL  2.  Will be able to ambulate at least 557f safely in 3MWT with use of SPC vs SBQC with no more than S level assist to improve community access  Baseline:  Goal status: INITIAL  3.  Will score at least 37 on the [place] to show improved balance/reduced fall risk  Baseline:  Goal status: INITIAL  4.  Will  demonstrate improved UE strength as evidenced by ability to lift rollator in/out of car for improved community access (for extended gait distances [id number]+) Baseline:  Goal status: INITIAL  5.  Will be compliant with advanced HEP vs gym program to maintain functional gains and prevent recurrence of impairments  Baseline:  Goal status: INITIAL     PLAN: PT FREQUENCY: 2x/week  PT DURATION: 10 weeks  PLANNED INTERVENTIONS: Therapeutic exercises, Therapeutic activity, Neuromuscular re-education, Balance training, Gait training, Patient/Family education, Self Care, Joint mobilization, Stair training, DME instructions, Aquatic Therapy, Taping, Ionotophoresis '4mg'$ /ml Dexamethasone, Manual therapy, and Re-evaluation.  PLAN FOR NEXT SESSION: focus on strength, balance, endurance; interested in gym machines so would probably enjoy incorporating this into sessions. Strengthen ankle dorsiflexors, may need to consider AFOs in the future if gait/ankle strength does not improve significantly with PT.   RoSelinda MichaelsII PT, DPT 12/21/21 8:35 PM

## 2021-12-26 ENCOUNTER — Encounter (HOSPITAL_BASED_OUTPATIENT_CLINIC_OR_DEPARTMENT_OTHER): Payer: Self-pay | Admitting: Physical Therapy

## 2021-12-26 ENCOUNTER — Ambulatory Visit (HOSPITAL_BASED_OUTPATIENT_CLINIC_OR_DEPARTMENT_OTHER): Payer: Medicare Other | Admitting: Physical Therapy

## 2021-12-26 DIAGNOSIS — M5459 Other low back pain: Secondary | ICD-10-CM

## 2021-12-26 DIAGNOSIS — M6281 Muscle weakness (generalized): Secondary | ICD-10-CM

## 2021-12-26 DIAGNOSIS — R2681 Unsteadiness on feet: Secondary | ICD-10-CM

## 2021-12-26 DIAGNOSIS — M48062 Spinal stenosis, lumbar region with neurogenic claudication: Secondary | ICD-10-CM | POA: Diagnosis not present

## 2021-12-26 DIAGNOSIS — R296 Repeated falls: Secondary | ICD-10-CM

## 2021-12-26 NOTE — Therapy (Signed)
OUTPATIENT PHYSICAL THERAPY TREATMENT NOTE   Patient Name: Charles Lloyd. MRN: [id number] DOB:08-02-1940, 81 y.o., male Today's Date: 12/26/2021   PT End of Session - 12/26/21 1325     Visit Number 8    Number of Visits 21    Date for PT Re-Evaluation 02/07/22    Authorization Type [employer] MCR    Authorization Time Period 11/29/21 to 02/07/22    Progress Note Due on Visit 10    PT Start Time 1305    PT Stop Time 1345    PT Time Calculation (min) 40 min    Activity Tolerance Patient tolerated treatment well    Behavior During Therapy [facility] for tasks assessed/performed                Past Medical History:  Diagnosis Date   Acute tracheobronchitis 01/05/2019   B12 deficiency    Basal cell carcinoma (BCC) of left side of nose 01/28/2018   Cancer ([place])    Cardiomyopathy ([place]) 03/10/2018   With chronic atrial fibrillation   CHF (congestive heart failure) ([place]) 04/16/2013   Functional class II, ejection fraction 35-40%  Formatting of this note might be different from the original. Functional class II, ejection fraction 35-40%   Chronic anticoagulation    Chronic diastolic (congestive) heart failure ([place]) 04/16/2013   Functional class II, ejection fraction 35-40%  Last Assessment & Plan:  Clinically stable Volume well controlled meds reviewed   Chronic diastolic CHF (congestive heart failure) ([place]) 04/16/2013   Functional class II, ejection fraction 35-40%  Last Assessment & Plan:  Clinically stable Volume well controlled meds reviewed   Colon polyps 04/16/2013   Diabetic neuropathy (HCC)    Eosinophil count raised 02/17/2019   Essential hypertension 01/06/2010   Last Assessment & Plan:  Well controlled Continue med management   Gout    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  Steroid usage was discontinued several months ago.  Follow up with Dermatology as planned.   Hypercholesterolemia 01/06/2010   Last  Assessment & Plan:  Repeat labs recommended   Hyperlipidemia associated with type 2 diabetes mellitus ([place]) 01/28/2018   Hypertensive heart disease with heart failure ([place]) 01/06/2010   Last Assessment & Plan:  Well controlled Continue med management   Hyponatremia 12/17/2018   Infected prosthetic knee joint ([place]) 06/24/2017   Last Assessment & Plan:  Id following ?ongoing abx managementy reported Request records   Infection of prosthetic right knee joint ([place])    Insomnia 03/04/2012   Grief with loss of wife 02/20/12  Last Assessment & Plan:  Improved sx  contineu xanax prn Se discussed   Lesion of liver 04/20/2019   -On cardiac CT 02/2019   Mild reactive airways disease    Neoplasm of prostate 04/16/2013   Neoplasm of prostate, malignant ([place]) 01/06/2010   [name] S/p radiation therapy   Last Assessment & Plan:  Repeat psa today No urinary sx Pt reported fatigue/poor appetite   On amiodarone therapy 09/07/2013   On continuous oral anticoagulation 01/28/2018   Other activity(E029.9) 04/20/2013   Formatting of this note might be different from the original. Transthoracic Echocardiogram-03/10/[facility]: Normal left ventricular wall thickness and cavity size.  Global left ventricular systolic function is moderately reduced.  The estimated ejection fraction is 35-40%.  The left atrium is moderately enlarged.  The right atrium is mildly enlarged.  No significant valvular    Overweight (BMI 25.0-29.9) 10/22/2016  Persistent atrial fibrillation ([place]) 04/16/2013   Last Assessment & Plan:  Rate controlled Continue med management eliquis for stroke preventino  Formatting of this note might be different from the original.  Drug  HX Current Rx Pre-ABL inefficacy Pre-ABL intolerant Post-ABL inefficacy Post-ABL intolerant max dose/24h M/Y end comments  sotalol                  dofetilide                  flecainide                  propafenone                  am   S/P TKR (total knee  replacement), bilateral    Secondary hypercoagulable state ([place]) 04/09/2019   Tinea cruris 04/16/2013   Type 2 diabetes mellitus with diabetic polyneuropathy, without long-term current use of insulin ([place]) 04/16/2013   Last Assessment & Plan:  Labs today Pt reports well controlled on ambulatory monitoring   Vitamin D deficiency 07/25/2018   Past Surgical History:  Procedure Laterality Date   ATRIAL FIBRILLATION ABLATION N/A 03/12/2019   Procedure: ATRIAL FIBRILLATION ABLATION;  Surgeon: [name], MD;  Location: [place];  Service: Cardiovascular;  Laterality: N/A;   ATRIAL FIBRILLATION ABLATION N/A 04/29/2020   Procedure: ATRIAL FIBRILLATION ABLATION;  Surgeon: [name], MD;  Location: [place] [facility];  Service: Cardiovascular;  Laterality: N/A;   CARDIAC ELECTROPHYSIOLOGY STUDY AND ABLATION     CARDIOVERSION     CARDIOVERSION N/A 10/10/2018   Procedure: CARDIOVERSION;  Surgeon: [name], MD;  Location: [facility]COPY;  Service: Cardiovascular;  Laterality: N/A;   CARDIOVERSION N/A 05/18/2020   Procedure: CARDIOVERSION;  Surgeon: [name], MD;  Location: [place];  Service: Cardiovascular;  Laterality: N/A;   CARDIOVERSION N/A 12/09/2020   Procedure: CARDIOVERSION;  Surgeon: Lelon Perla, MD;  Location: [facility] ENDOSCOPY;  Service: Cardiovascular;  Laterality: N/A;   CHOLECYSTECTOMY     PROSTATECTOMY     REPLACEMENT TOTAL KNEE BILATERAL     Patient Active Problem List   Diagnosis Date Noted   Acute renal failure superimposed on stage 3b chronic kidney disease ([place]) 11/16/2021   Uremia 11/16/2021   Gout due to renal impairment 10/17/2021   Pancytopenia ([place]) 08/05/2021   SIRS (systemic inflammatory response syndrome) ([place]) 08/04/2021   Chronic kidney disease, stage 3b ([place]) 12/30/2020   Multiple falls    Spinal stenosis    S/P TKR (total knee replacement), bilateral    Arthralgia 11/20/2019   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   Lesion  of liver 04/20/2019   Secondary hypercoagulable state ([place]) 04/09/2019   Eosinophil count raised 02/17/2019   Acute tracheobronchitis 01/05/2019   Asthma    Vitamin D deficiency 07/25/2018   Chronic anticoagulation 03/06/2018   Hyperlipidemia associated with type 2 diabetes mellitus ([place]) 01/28/2018   Basal cell carcinoma (BCC) of left side of nose 01/28/2018   Infected prosthetic knee joint ([place]) 06/24/2017   B12 deficiency 10/22/2016   Overweight (BMI 25.0-29.9) 10/22/2016   Primary osteoarthritis of right knee 03/29/2015   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   Paroxysmal A-fib ([place]) 04/16/2013   Chronic diastolic (congestive) heart failure ([place]) 04/16/2013   Colon polyps 04/16/2013   NIDDM-2 with polyneuropathy and hyperglycemia 04/16/2013   Tinea cruris 04/16/2013   Diabetic neuropathy ([place]) 07/14/2012   Insomnia 03/04/2012   Hypertensive heart disease with heart failure ([place]) 01/06/2010  Neoplasm of prostate, malignant ([place]) 01/06/2010   Hypercholesterolemia 01/06/2010   Essential hypertension 01/06/2010    PCP: Isaac Bliss, Olam Idler   REFERRING PROVIDER: Reece Agar, MD   REFERRING DIAG: [phone] (ICD-10-CM) - Spinal stenosis, lumbar region with neurogenic claudication   Rationale for Evaluation and Treatment Rehabilitation  THERAPY DIAG:  Unsteadiness on feet  Repeated falls  Muscle weakness (generalized)  Other low back pain  ONSET DATE: 11/15/2021   SUBJECTIVE:                                                                                                                                                                                           SUBJECTIVE STATEMENT: Pt denies any adverse effects after prior Rx.  Pt states he was at a softball tournament this past weekend in [facility] which wore him out.  Pt had increased lumbar pain while at the tournament due to prolonged walking and sitting.  Pt states he lost his balance some walking on uneven ground and  his son had to help him.  Pt did not have any falls.   Pt states he is having an injection on Friday and states his last injection was 3 months ago.  Pt reports compliance with HEP.  Pt states his feet are getting stronger and he is walking longer distance.  Pt uses a SPC outside of home, but uses the rollator walking in here due to the hill outside.  Pt doesn't use any AD at home.   PERTINENT HISTORY:  Lumbar anterolisthesis, Peripheral neuropathy, TKR bilat, A-fib, CHF, DM  PAIN:  Are you having pain? Yes: NPRS scale: 3/10 Pain location: central lumbar  Pain description: dull, achy Aggravating factors: none  Relieving factors: injections   FOTO: 12/19/2021: 56%    PRECAUTIONS: Fall and Other: severe neuropathy   WEIGHT BEARING RESTRICTIONS No  FALLS:  Has patient fallen in last 6 months? Yes. Number of falls 3.5 (caught himself on one fall, on the other 3 falls my legs just gave way like someone took my legs out from under me)  LIVING ENVIRONMENT: Lives with: lives alone and family is near by and can help him easily  Lives in: House/apartment Stairs:  two story townhouse with stair lift inside, no STE but does have to deal with one step going up to chair lift Has following equipment at home: Single point cane, Walker - 4 wheeled, and stair lift, raised toilet seats, shower stool   OCCUPATION: retired   PLOF: Independent, Independent with basic ADLs, and Requires assistive device for independence  PATIENT GOALS stop falling, try to gain some strength   OBJECTIVE:  DIAGNOSTIC FINDINGS:  IMPRESSION: 1. 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 2. Stable to slight progression of severe spinal stenosis and moderate neural foraminal stenosis at L2-3. 3. Slight progression of moderate spinal stenosis at L3-4.    TODAY'S TREATMENT   Pt performed: Seated heel raises 2x10 Seated toe raises 2 x 10 Seated BAPS f/b approx 20-25 reps  Ankle DF with T band with YTB  2x10 on L  Seated LAQ 5# 3x10 bilat  R ankle DF in L S/L'ing x30 reps  Sidestepping with Ue's on rail x 2 laps  Nustep L3 x 4 mins with bilat UE/LEs  Neuro Re-ed Activities:            Standing with FT without UE support 2x30 sec on airex    Weight shifts f/b x 10, 5 reps and s/s x 10 reps without UE support with SBA and min Assist for LOB with f/b            Staggered stance with occasional UE support with L UE back 2x20 sec    Marching on airex 2x10 reps with UE support        PATIENT EDUCATION:  Education details: Exercise form, rationale of exercises, and POC.  Person educated: Patient Education method: Explanation, demonstration, verbal and tactile cues Education comprehension: verbalized understanding, returned demonstration, and needs further education, verbal and tactile cues required   HOME EXERCISE PROGRAM: Access Code: [email or web address] URL: [email or web address] Date: 12/01/2021 Prepared by: Ronny Flurry  Exercises - Supine Transversus Abdominis Bracing - Hands on Stomach  - 2 x daily - 7 x weekly - 2 sets - 10 reps - Supine March  - 1-2 x daily - 7 x weekly - 2 sets - 5-10 reps - Seated Hip Abduction with Resistance  - 1 x daily - 4 x weekly - 2 sets - 10 reps - Seated Knee Extension with Resistance  - 1 x daily - 3-4 x weekly - 2 sets - 10 reps - Seated March  - 1 x daily - 3-4 x weekly - 2 sets - 10 reps - Seated Heel Raise  - 1 x daily - 7 x weekly - 2 sets - 10 reps  ASSESSMENT:  CLINICAL IMPRESSION: Pt reports he is able to ambulate increased distance.  He continues to have lumbar pain which did worsen being at the softball tournament this past weekend.  He is getting an injection on Friday.  Pt has decreased DF ROM and strength in bilat ankles, worse on R.  His anterior tib fatigues quickly with DF exercises which is worse on R.  Attempted S/L'ing DF for R ankle in order to perform DF in a gravity minimized position to promote increased DF.  Pt is  improving with tolerance to standing exercises and demonstrates improved balance requiring less UE support.  He responded well to Rx reporting no increased pain though does report LE fatigue.  He should benefit from cont skilled PT services to address goals and improve overall function.  OBJECTIVE IMPAIRMENTS Abnormal gait, cardiopulmonary status limiting activity, decreased balance, decreased coordination, decreased knowledge of use of DME, decreased mobility, difficulty walking, decreased strength, decreased safety awareness, increased edema, and impaired UE functional use.   ACTIVITY LIMITATIONS standing, squatting, stairs, transfers, bed mobility, reach over head, and locomotion level  PARTICIPATION LIMITATIONS: shopping, community activity, and yard work  PERSONAL FACTORS Age, Behavior pattern, Fitness, Past/current experiences, and Time since onset of injury/illness/exacerbation are also affecting patient's functional outcome.   REHAB  POTENTIAL: Good  CLINICAL DECISION MAKING: Evolving/moderate complexity  EVALUATION COMPLEXITY: Moderate   GOALS: Goals reviewed with patient? Yes  SHORT TERM GOALS: Target date: 01/03/2022  Will be compliant with appropriate progressive HEP to include walking vs floor bike program  Baseline: Goal status: INITIAL  2.  Will be able to name 3 ways to reduce fall risk at home and in community  Baseline:  Goal status: INITIAL  3.  Will demonstrate improved ankle dorsiflexor endurance and reduce related fall risk as evidenced by elimination of foot slap during 3MWT  Baseline:  Goal status: INITIAL  4. Will be independent with alternative ways to control edema in BLEs (such as spiral wrapping with ACE bandages) Baseline:  Goal status: INITIAL    LONG TERM GOALS: Target date: 02/07/2022  MMT to improve by at least 1 grade in all weak groups  Baseline:  Goal status: INITIAL  2.  Will be able to ambulate at least 527f safely in 3MWT with use  of SPC vs SBQC with no more than S level assist to improve community access  Baseline:  Goal status: INITIAL  3.  Will score at least 37 on the Berg to show improved balance/reduced fall risk  Baseline:  Goal status: INITIAL  4.  Will demonstrate improved UE strength as evidenced by ability to lift rollator in/out of car for improved community access (for extended gait distances 10029f+) Baseline:  Goal status: INITIAL  5.  Will be compliant with advanced HEP vs gym program to maintain functional gains and prevent recurrence of impairments  Baseline:  Goal status: INITIAL     PLAN: PT FREQUENCY: 2x/week  PT DURATION: 10 weeks  PLANNED INTERVENTIONS: Therapeutic exercises, Therapeutic activity, Neuromuscular re-education, Balance training, Gait training, Patient/Family education, Self Care, Joint mobilization, Stair training, DME instructions, Aquatic Therapy, Taping, Ionotophoresis '4mg'$ /ml Dexamethasone, Manual therapy, and Re-evaluation.  PLAN FOR NEXT SESSION: focus on strength, balance, endurance; interested in gym machines so would probably enjoy incorporating this into sessions. Strengthen ankle dorsiflexors, may need to consider AFOs in the future if gait/ankle strength does not improve significantly with PT.   RoSelinda MichaelsII PT, DPT 12/26/21 3:02 PM

## 2021-12-28 ENCOUNTER — Encounter (HOSPITAL_BASED_OUTPATIENT_CLINIC_OR_DEPARTMENT_OTHER): Payer: Self-pay | Admitting: Physical Therapy

## 2021-12-28 ENCOUNTER — Ambulatory Visit (HOSPITAL_BASED_OUTPATIENT_CLINIC_OR_DEPARTMENT_OTHER): Payer: Medicare Other | Attending: Pain Medicine | Admitting: Physical Therapy

## 2021-12-28 DIAGNOSIS — M5459 Other low back pain: Secondary | ICD-10-CM | POA: Insufficient documentation

## 2021-12-28 DIAGNOSIS — R2681 Unsteadiness on feet: Secondary | ICD-10-CM | POA: Diagnosis not present

## 2021-12-28 DIAGNOSIS — M6281 Muscle weakness (generalized): Secondary | ICD-10-CM | POA: Diagnosis present

## 2021-12-28 DIAGNOSIS — R296 Repeated falls: Secondary | ICD-10-CM | POA: Diagnosis present

## 2021-12-28 NOTE — Therapy (Signed)
OUTPATIENT PHYSICAL THERAPY TREATMENT NOTE   Patient Name: Charles Lloyd. MRN: [id number] DOB:[birth year], 81 y.o., male Today's Date: 12/26/2021   PT End of Session - 12/26/21 1325     Visit Number 8    Number of Visits 21    Date for PT Re-Evaluation 02/07/22    Authorization Type [employer] MCR    Authorization Time Period 11/29/21 to 02/07/22    Progress Note Due on Visit 10    PT Start Time 1305    PT Stop Time 1345    PT Time Calculation (min) 40 min    Activity Tolerance Patient tolerated treatment well    Behavior During Therapy [facility] for tasks assessed/performed                Past Medical History:  Diagnosis Date   Acute tracheobronchitis 01/05/2019   B12 deficiency    Basal cell carcinoma (BCC) of left side of nose 01/28/2018   Cancer ([place])    Cardiomyopathy ([place]) 03/10/2018   With chronic atrial fibrillation   CHF (congestive heart failure) ([place]) 04/16/2013   Functional class II, ejection fraction 35-40%  Formatting of this note might be different from the original. Functional class II, ejection fraction 35-40%   Chronic anticoagulation    Chronic diastolic (congestive) heart failure ([place]) 04/16/2013   Functional class II, ejection fraction 35-40%  Last Assessment & Plan:  Clinically stable Volume well controlled meds reviewed   Chronic diastolic CHF (congestive heart failure) ([place]) 04/16/2013   Functional class II, ejection fraction 35-40%  Last Assessment & Plan:  Clinically stable Volume well controlled meds reviewed   Colon polyps 04/16/2013   Diabetic neuropathy (HCC)    Eosinophil count raised 02/17/2019   Essential hypertension 01/06/2010   Last Assessment & Plan:  Well controlled Continue med management   Gout    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  Steroid usage was discontinued several months ago.  Follow up with Dermatology as planned.   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   Last Assessment & Plan:  Id following ?ongoing abx managementy reported Request records   Infection of prosthetic right knee joint ([place])    Insomnia 03/04/2012   Grief with loss of wife 02/20/12  Last Assessment & Plan:  Improved sx  contineu xanax prn Se discussed   Lesion of liver 04/20/2019   -On cardiac CT 02/2019   Mild reactive airways disease    Neoplasm of prostate 04/16/2013   Neoplasm of prostate, malignant ([place]) 01/06/2010   [name] S/p radiation therapy   Last Assessment & Plan:  Repeat psa today No urinary sx Pt reported fatigue/poor appetite   On amiodarone therapy 09/07/2013   On continuous oral anticoagulation 01/28/2018   Other activity(E029.9) 04/20/2013   Formatting of this note might be different from the original. Transthoracic Echocardiogram-03/10/[facility]: Normal left ventricular wall thickness and cavity size.  Global left ventricular systolic function is moderately reduced.  The estimated ejection fraction is 35-40%.  The left atrium is moderately enlarged.  The right atrium is mildly enlarged.  No significant valvular    Overweight (BMI 25.0-29.9) 10/22/2016  Persistent atrial fibrillation ([place]) 04/16/2013   Last Assessment & Plan:  Rate controlled Continue med management eliquis for stroke preventino  Formatting of this note might be different from the original.  Drug  HX Current Rx Pre-ABL inefficacy Pre-ABL intolerant Post-ABL inefficacy Post-ABL intolerant max dose/24h M/Y end comments  sotalol                  dofetilide                  flecainide                  propafenone                  am   S/P TKR (total knee  replacement), bilateral    Secondary hypercoagulable state ([place]) 04/09/2019   Tinea cruris 04/16/2013   Type 2 diabetes mellitus with diabetic polyneuropathy, without long-term current use of insulin ([place]) 04/16/2013   Last Assessment & Plan:  Labs today Pt reports well controlled on ambulatory monitoring   Vitamin D deficiency 07/25/2018   Past Surgical History:  Procedure Laterality Date   ATRIAL FIBRILLATION ABLATION N/A 03/12/2019   Procedure: ATRIAL FIBRILLATION ABLATION;  Surgeon: [name], MD;  Location: [facility];  Service: Cardiovascular;  Laterality: N/A;   ATRIAL FIBRILLATION ABLATION N/A 04/29/2020   Procedure: ATRIAL FIBRILLATION ABLATION;  Surgeon: [name], MD;  Location: [facility];  Service: Cardiovascular;  Laterality: N/A;   CARDIAC ELECTROPHYSIOLOGY STUDY AND ABLATION     CARDIOVERSION     CARDIOVERSION N/A 10/10/2018   Procedure: CARDIOVERSION;  Surgeon: [name], MD;  Location: [facility] ENDOSCOPY;  Service: Cardiovascular;  Laterality: N/A;   CARDIOVERSION N/A 05/18/2020   Procedure: CARDIOVERSION;  Surgeon: [name], MD;  Location: [place] [facility];  Service: Cardiovascular;  Laterality: N/A;   CARDIOVERSION N/A 12/09/2020   Procedure: CARDIOVERSION;  Surgeon: Lelon Perla, MD;  Location: [facility] ENDOSCOPY;  Service: Cardiovascular;  Laterality: N/A;   CHOLECYSTECTOMY     PROSTATECTOMY     REPLACEMENT TOTAL KNEE BILATERAL     Patient Active Problem List   Diagnosis Date Noted   Acute renal failure superimposed on stage 3b chronic kidney disease ([place]) 11/16/2021   Uremia 11/16/2021   Gout due to renal impairment 10/17/2021   Pancytopenia ([place]) 08/05/2021   SIRS (systemic inflammatory response syndrome) ([place]) 08/04/2021   Chronic kidney disease, stage 3b ([place]) 12/30/2020   Multiple falls    Spinal stenosis    S/P TKR (total knee replacement), bilateral    Arthralgia 11/20/2019   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   Lesion  of liver 04/20/2019   Secondary hypercoagulable state ([place]) 04/09/2019   Eosinophil count raised 02/17/2019   Acute tracheobronchitis 01/05/2019   Asthma    Vitamin D deficiency 07/25/2018   Chronic anticoagulation 03/06/2018   Hyperlipidemia associated with type 2 diabetes mellitus ([place]) 01/28/2018   Basal cell carcinoma (BCC) of left side of nose 01/28/2018   Infected prosthetic knee joint ([place]) 06/24/2017   B12 deficiency 10/22/2016   Overweight (BMI 25.0-29.9) 10/22/2016   Primary osteoarthritis of right knee 03/29/2015   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   Paroxysmal A-fib ([place]) 04/16/2013   Chronic diastolic (congestive) heart failure ([place]) 04/16/2013   Colon polyps 04/16/2013   NIDDM-2 with polyneuropathy and hyperglycemia 04/16/2013   Tinea cruris 04/16/2013   Diabetic neuropathy ([place]) 07/14/2012   Insomnia 03/04/2012   Hypertensive heart disease with heart failure ([place]) 01/06/2010  Neoplasm of prostate, malignant ([place]) 01/06/2010   Hypercholesterolemia 01/06/2010   Essential hypertension 01/06/2010    PCP: Isaac Bliss, Olam Idler   REFERRING PROVIDER: Reece Agar, MD   REFERRING DIAG: [phone] (ICD-10-CM) - Spinal stenosis, lumbar region with neurogenic claudication   Rationale for Evaluation and Treatment Rehabilitation  THERAPY DIAG:  Unsteadiness on feet  Repeated falls  Muscle weakness (generalized)  Other low back pain  ONSET DATE: 11/15/2021   SUBJECTIVE:                                                                                                                                                                                           SUBJECTIVE STATEMENT: Pt denies any adverse effects after prior Rx.  Pt reports compliance with HEP.  Pt states "my legs are definitely stronger".  Pt feels more steady with ambulation and is ambulating increased distance.  Pt has a lumbar injection tomorrow.  Pt reports much improved swelling in bilat ankles which he  states the meds have helped.  Pt uses a SPC outside of home, but uses the rollator walking in here due to the hill outside.  Pt doesn't use any AD at home.   PERTINENT HISTORY:  Lumbar anterolisthesis, Peripheral neuropathy, TKR bilat, A-fib, CHF, DM  PAIN:  Are you having pain? Yes: NPRS scale: 3/10 Pain location: central lumbar  Pain description: dull, achy Aggravating factors: none  Relieving factors: injections   FOTO: 12/19/2021: 56%    PRECAUTIONS: Fall and Other: severe neuropathy   WEIGHT BEARING RESTRICTIONS No  FALLS:  Has patient fallen in last 6 months? Yes. Number of falls 3.5 (caught himself on one fall, on the other 3 falls my legs just gave way like someone took my legs out from under me)  LIVING ENVIRONMENT: Lives with: lives alone and family is near by and can help him easily  Lives in: House/apartment Stairs:  two story townhouse with stair lift inside, no STE but does have to deal with one step going up to chair lift Has following equipment at home: Single point cane, Walker - 4 wheeled, and stair lift, raised toilet seats, shower stool   OCCUPATION: retired   PLOF: Independent, Independent with basic ADLs, and Requires assistive device for independence  PATIENT GOALS stop falling, try to gain some strength   OBJECTIVE:   DIAGNOSTIC FINDINGS:  IMPRESSION: 1. 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 2. Stable to slight progression of severe spinal stenosis and moderate neural foraminal stenosis at L2-3. 3. Slight progression of moderate spinal stenosis at L3-4.    TODAY'S TREATMENT   Pt performed:  Nustep L3 x  4 mins with bilat UE/Les Seated toe raises 2 x 10 Seated BAPS f/b approx 30 reps  Ankle DF with T band with YTB 2x10 on L  Seated LAQ 5# 3x10 bilat  R ankle DF in L S/L'ing x30 reps  Sidestepping with Ue's on rail x 2 laps Standing heel raises 2x10   Neuro Re-ed Activities:            Standing with FT without UE support  2x30 sec on airex    Weight shifts f/b x 10, 5 reps and s/s x 10 reps without UE support with SBA and min Assist for LOB with f/b            Staggered stance with occasional UE support with L UE back 2x25-30 sec    Marching on airex 2x10 reps with UE support        PATIENT EDUCATION:  Education details: Exercise form, rationale of exercises, and POC.  Person educated: Patient Education method: Explanation, demonstration, verbal and tactile cues Education comprehension: verbalized understanding, returned demonstration, and needs further education, verbal and tactile cues required   HOME EXERCISE PROGRAM: Access Code: [email or web address] URL: [email or web address] Date: 12/01/2021 Prepared by: Ronny Flurry  Exercises - Supine Transversus Abdominis Bracing - Hands on Stomach  - 2 x daily - 7 x weekly - 2 sets - 10 reps - Supine March  - 1-2 x daily - 7 x weekly - 2 sets - 5-10 reps - Seated Hip Abduction with Resistance  - 1 x daily - 4 x weekly - 2 sets - 10 reps - Seated Knee Extension with Resistance  - 1 x daily - 3-4 x weekly - 2 sets - 10 reps - Seated March  - 1 x daily - 3-4 x weekly - 2 sets - 10 reps - Seated Heel Raise  - 1 x daily - 7 x weekly - 2 sets - 10 reps  ASSESSMENT:  CLINICAL IMPRESSION: Pt is improving with function as evidenced by subjective reports of feeling more steady with gait and ambulating increased distance.  He continues to have limited bilat ankle DF ROM (R>L), though demonstrated improved R ankle DF with toe raises in sitting and S/L'ing DF.  Pt is improving with tolerance to standing exercises and demonstrates improved balance requiring less UE support.  He has limited height with standing heel raises.  He responded well to Rx reporting no increased pain though does report LE fatigue.  He should benefit from cont skilled PT services to address goals and improve overall function.       OBJECTIVE IMPAIRMENTS Abnormal gait, cardiopulmonary status  limiting activity, decreased balance, decreased coordination, decreased knowledge of use of DME, decreased mobility, difficulty walking, decreased strength, decreased safety awareness, increased edema, and impaired UE functional use.   ACTIVITY LIMITATIONS standing, squatting, stairs, transfers, bed mobility, reach over head, and locomotion level  PARTICIPATION LIMITATIONS: shopping, community activity, and yard work  PERSONAL FACTORS Age, Behavior pattern, Fitness, Past/current experiences, and Time since onset of injury/illness/exacerbation are also affecting patient's functional outcome.   REHAB POTENTIAL: Good  CLINICAL DECISION MAKING: Evolving/moderate complexity  EVALUATION COMPLEXITY: Moderate   GOALS: Goals reviewed with patient? Yes  SHORT TERM GOALS: Target date: 01/03/2022  Will be compliant with appropriate progressive HEP to include walking vs floor bike program  Baseline: Goal status: INITIAL  2.  Will be able to name 3 ways to reduce fall risk at home and in community  Baseline:  Goal status: INITIAL  3.  Will demonstrate improved ankle dorsiflexor endurance and reduce related fall risk as evidenced by elimination of foot slap during 3MWT  Baseline:  Goal status: INITIAL  4. Will be independent with alternative ways to control edema in BLEs (such as spiral wrapping with ACE bandages) Baseline:  Goal status: INITIAL    LONG TERM GOALS: Target date: 02/07/2022  MMT to improve by at least 1 grade in all weak groups  Baseline:  Goal status: INITIAL  2.  Will be able to ambulate at least 532f safely in 3MWT with use of SPC vs SBQC with no more than S level assist to improve community access  Baseline:  Goal status: INITIAL  3.  Will score at least 37 on the Berg to show improved balance/reduced fall risk  Baseline:  Goal status: INITIAL  4.  Will demonstrate improved UE strength as evidenced by ability to lift rollator in/out of car for improved  community access (for extended gait distances 10060f+) Baseline:  Goal status: INITIAL  5.  Will be compliant with advanced HEP vs gym program to maintain functional gains and prevent recurrence of impairments  Baseline:  Goal status: INITIAL     PLAN: PT FREQUENCY: 2x/week  PT DURATION: 10 weeks  PLANNED INTERVENTIONS: Therapeutic exercises, Therapeutic activity, Neuromuscular re-education, Balance training, Gait training, Patient/Family education, Self Care, Joint mobilization, Stair training, DME instructions, Aquatic Therapy, Taping, Ionotophoresis '4mg'$ /ml Dexamethasone, Manual therapy, and Re-evaluation.  PLAN FOR NEXT SESSION: focus on strength, balance, endurance; interested in gym machines so would probably enjoy incorporating this into sessions. Strengthen ankle dorsiflexors, may need to consider AFOs in the future if gait/ankle strength does not improve significantly with PT.  He is receiving a lumbar injection tomorrow.    RoSelinda MichaelsII PT, DPT 12/29/21 9:45 AM

## 2022-01-02 ENCOUNTER — Ambulatory Visit (HOSPITAL_BASED_OUTPATIENT_CLINIC_OR_DEPARTMENT_OTHER): Payer: Medicare Other | Admitting: Physical Therapy

## 2022-01-02 ENCOUNTER — Encounter (HOSPITAL_BASED_OUTPATIENT_CLINIC_OR_DEPARTMENT_OTHER): Payer: Self-pay | Admitting: Physical Therapy

## 2022-01-02 DIAGNOSIS — R296 Repeated falls: Secondary | ICD-10-CM

## 2022-01-02 DIAGNOSIS — R2681 Unsteadiness on feet: Secondary | ICD-10-CM | POA: Diagnosis not present

## 2022-01-02 DIAGNOSIS — M5459 Other low back pain: Secondary | ICD-10-CM

## 2022-01-02 DIAGNOSIS — M6281 Muscle weakness (generalized): Secondary | ICD-10-CM

## 2022-01-02 NOTE — Therapy (Incomplete)
OUTPATIENT PHYSICAL THERAPY TREATMENT NOTE  Progress Note Reporting Period 11/29/21 to 01/02/2022  See note below for Objective Data and Assessment of Progress/Goals.       Patient Name: Charles Lloyd. MRN: [id number] DOB:01-Oct-1940, 81 y.o., male Today's Date: 01/03/2022    PT End of Session - 01/02/22      Visit Number 10    Number of Visits 20     Date for PT Re-Evaluation 02/07/22     Authorization Type [employer] MCR     Authorization Time Period     Progress Note Due on Visit 20     PT Start Time 1303     PT Stop Time 1346    PT Time Calculation (min) 43 min     Activity Tolerance Patient tolerated treatment well     Behavior During Therapy WFL for tasks assessed/performed            Past Medical History:  Diagnosis Date   Acute tracheobronchitis 01/05/2019   B12 deficiency    Basal cell carcinoma (BCC) of left side of nose 01/28/2018   Cancer ([place])    Cardiomyopathy ([place]) 03/10/2018   With chronic atrial fibrillation   CHF (congestive heart failure) ([place]) 04/16/2013   Functional class II, ejection fraction 35-40%  Formatting of this note might be different from the original. Functional class II, ejection fraction 35-40%   Chronic anticoagulation    Chronic diastolic (congestive) heart failure ([place]) 04/16/2013   Functional class II, ejection fraction 35-40%  Last Assessment & Plan:  Clinically stable Volume well controlled meds reviewed   Chronic diastolic CHF (congestive heart failure) ([place]) 04/16/2013   Functional class II, ejection fraction 35-40%  Last Assessment & Plan:  Clinically stable Volume well controlled meds reviewed   Colon polyps 04/16/2013   Diabetic neuropathy (HCC)    Eosinophil count raised 02/17/2019   Essential hypertension 01/06/2010   Last Assessment & Plan:  Well controlled Continue med management   Gout    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  Steroid usage was  discontinued several months ago.  Follow up with Dermatology as planned.   Hypercholesterolemia 01/06/2010   Last Assessment & Plan:  Repeat labs recommended   Hyperlipidemia associated with type 2 diabetes mellitus ([place]) 01/28/2018   Hypertensive heart disease with heart failure ([place]) 01/06/2010   Last Assessment & Plan:  Well controlled Continue med management   Hyponatremia 12/17/2018   Infected prosthetic knee joint ([place]) 06/24/2017   Last Assessment & Plan:  Id following ?ongoing abx managementy reported Request records   Infection of prosthetic right knee joint ([place])    Insomnia 03/04/2012   Grief with loss of wife 02/20/12  Last Assessment & Plan:  Improved sx  contineu xanax prn Se discussed   Lesion of liver 04/20/2019   -On cardiac CT 02/2019   Mild reactive airways disease    Neoplasm of prostate 04/16/2013   Neoplasm of prostate, malignant ([place]) 01/06/2010   [name] S/p radiation therapy   Last Assessment & Plan:  Repeat psa today No urinary sx Pt reported fatigue/poor appetite   On amiodarone therapy 09/07/2013   On continuous oral anticoagulation 01/28/2018   Other activity(E029.9) 04/20/2013   Formatting of this note might be different from the original. Transthoracic Echocardiogram-03/10/2013-[facility]: Normal left ventricular wall thickness and cavity size.  Global left ventricular systolic function is moderately reduced.  The estimated ejection fraction is 35-40%.  The left atrium is moderately enlarged.  The right atrium is mildly enlarged.  No significant valvular    Overweight (BMI 25.0-29.9) 10/22/2016   Persistent atrial fibrillation ([place]) 04/16/2013   Last Assessment & Plan:  Rate controlled Continue med management eliquis for stroke preventino  Formatting of this note might be different from the original.  Drug  HX Current Rx Pre-ABL inefficacy Pre-ABL intolerant Post-ABL inefficacy Post-ABL intolerant max dose/24h M/Y end comments  sotalol                   dofetilide                  flecainide                  propafenone                  am   S/P TKR (total knee replacement), bilateral    Secondary hypercoagulable state ([place]) 04/09/2019   Tinea cruris 04/16/2013   Type 2 diabetes mellitus with diabetic polyneuropathy, without long-term current use of insulin ([place]) 04/16/2013   Last Assessment & Plan:  Labs today Pt reports well controlled on ambulatory monitoring   Vitamin D deficiency 07/25/2018   Past Surgical History:  Procedure Laterality Date   ATRIAL FIBRILLATION ABLATION N/A 03/12/2019   Procedure: ATRIAL FIBRILLATION ABLATION;  Surgeon: [name], MD;  Location: [facility];  Service: Cardiovascular;  Laterality: N/A;   ATRIAL FIBRILLATION ABLATION N/A 04/29/2020   Procedure: ATRIAL FIBRILLATION ABLATION;  Surgeon: [name], MD;  Location: [place] [facility];  Service: Cardiovascular;  Laterality: N/A;   CARDIAC ELECTROPHYSIOLOGY STUDY AND ABLATION     CARDIOVERSION     CARDIOVERSION N/A 10/10/2018   Procedure: CARDIOVERSION;  Surgeon: [name], MD;  Location: [facility];  Service: Cardiovascular;  Laterality: N/A;   CARDIOVERSION N/A 05/18/2020   Procedure: CARDIOVERSION;  Surgeon: [name], MD;  Location: [facility];  Service: Cardiovascular;  Laterality: N/A;   CARDIOVERSION N/A 12/09/2020   Procedure: CARDIOVERSION;  Surgeon: Lelon Perla, MD;  Location[facility];  Service: Cardiovascular;  Laterality: N/A;   CHOLECYSTECTOMY     PROSTATECTOMY     REPLACEMENT TOTAL KNEE BILATERAL     Patient Active Problem List   Diagnosis Date Noted   Acute renal failure superimposed on stage 3b chronic kidney disease ([place]) 11/16/2021   Uremia 11/16/2021   Gout due to renal impairment 10/17/2021   Pancytopenia ([place]) 08/05/2021   SIRS (systemic inflammatory response syndrome) ([place]) 08/04/2021   Chronic kidney disease, stage 3b ([place]) 12/30/2020   Multiple falls    Spinal stenosis     S/P TKR (total knee replacement), bilateral    Arthralgia 11/20/2019   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   Lesion of liver 04/20/2019   Secondary hypercoagulable state ([place]) 04/09/2019   Eosinophil count raised 02/17/2019   Acute tracheobronchitis 01/05/2019   Asthma    Vitamin D deficiency 07/25/2018   Chronic anticoagulation 03/06/2018   Hyperlipidemia associated with type 2 diabetes mellitus ([place]) 01/28/2018   Basal cell carcinoma (BCC) of left side of nose 01/28/2018   Infected prosthetic knee joint ([place]) 06/24/2017   B12 deficiency 10/22/2016   Overweight (BMI 25.0-29.9) 10/22/2016   Primary osteoarthritis of right knee 03/29/2015   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   Paroxysmal A-fib ([place]) 04/16/2013   Chronic diastolic (congestive) heart failure ([place]) 04/16/2013   Colon polyps 04/16/2013   NIDDM-2 with polyneuropathy and hyperglycemia  04/16/2013   Tinea cruris 04/16/2013   Diabetic neuropathy ([place]) 07/14/2012   Insomnia 03/04/2012   Hypertensive heart disease with heart failure ([place]) 01/06/2010   Neoplasm of prostate, malignant ([place]) 01/06/2010   Hypercholesterolemia 01/06/2010   Essential hypertension 01/06/2010    PCP: Isaac Bliss, Olam Idler   REFERRING PROVIDER: Reece Agar, MD   REFERRING DIAG: [phone] (ICD-10-CM) - Spinal stenosis, lumbar region with neurogenic claudication   Rationale for Evaluation and Treatment Rehabilitation  THERAPY DIAG:  Unsteadiness on feet  Repeated falls  Muscle weakness (generalized)  Other low back pain  ONSET DATE: 11/15/2021   SUBJECTIVE:                                                                                                                                                                                           SUBJECTIVE STATEMENT: Pt denies any adverse effects after prior Rx.  Pt reports compliance with HEP.     Pt unable to receive a lumbar injection due to not stopping Eloquis and has rescheduled for 11/20.  Pt  reports much improved swelling in bilat ankles which he states the meds have helped.  Pt uses a SPC outside of home, but uses the rollator walking in here due to the hill outside.  Pt doesn't use any AD at home.   Pt states "my legs are definitely stronger".  Pt feels more steady with ambulation and is ambulating increased distance.  Pt is able to ambulate increased distance in grocery store.  Pt vacuumed and dusted his town home and felt more steady performing cleaning.  Pt has had no falls since starting PT.  Pt reports he is able to lift rollator into car and also take it out of car.   PERTINENT HISTORY:  Lumbar anterolisthesis, Peripheral neuropathy, TKR bilat, A-fib, CHF, DM  PAIN:  Are you having pain? Yes: NPRS scale: Current:  4/10, Best:  1/10, Worst:  6/10 Pain location: central lumbar  Pain description: dull, achy Aggravating factors: none  Relieving factors: injections   FOTO: INITIAL/CURRENT:  59/63 ; Goal of 64 at visit #10    PRECAUTIONS: Fall and Other: severe neuropathy   WEIGHT BEARING RESTRICTIONS No  FALLS:  Has patient fallen in last 6 months? Yes. Number of falls 3.5 (caught himself on one fall, on the other 3 falls my legs just gave way like someone took my legs out from under me)  LIVING ENVIRONMENT: Lives with: lives alone and family is near by and can help him easily  Lives in: House/apartment Stairs:  two story townhouse with stair lift inside, no STE but does have to deal  with one step going up to chair lift Has following equipment at home: Single point cane, Walker - 4 wheeled, and stair lift, raised toilet seats, shower stool   OCCUPATION: retired   PLOF: Independent, Independent with basic ADLs, and Requires assistive device for independence  PATIENT GOALS stop falling, try to gain some strength   OBJECTIVE:   DIAGNOSTIC FINDINGS:  IMPRESSION: 1. 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 2. Stable to slight progression of severe  spinal stenosis and moderate neural foraminal stenosis at L2-3. 3. Slight progression of moderate spinal stenosis at L3-4.    TODAY'S TREATMENT   Nustep L3 x 5 mins with bilat UE/Les  Physical Performance testing:  Reviewed current function, pain levels, and HEP compliance for progress toward goals and progress note.   LOWER EXTREMITY MMT:     MMT Right 11/7 Left 11/7  Hip flexion 4/5 4/5  Hip extension    Hip abduction 4-/5 3+/5  Hip adduction      Hip internal rotation      Hip external rotation      Knee flexion      Knee extension 4/5  4+/5  Ankle dorsiflexion <3/5, unable to tolerate resistance 3+/5  Ankle plantarflexion     Ankle inversion Unable to tolerate resistance 3+/5  Ankle eversion Unable to tolerate resistance Unable to tolerate resistance   (Blank rows = not tested)     UE MMT:  Flexion R 4+/5 L 4+/5 ABD R 4/5 L 4/5 Biceps R 5/5, L 4/5 Triceps R 5/5, L 4/5   FUNCTIONAL TESTS:  3 minute walk test:  Initial/Current: 454 ft / 550 ft with rollator  Berg Balance Scale: Initial/Current:  27/56 / 33/56   GAIT: Assistive device utilized: rollator Comments: flexed at hips, B foot slap noted due to B dorsiflexor weakness, slow gait speed      PATIENT EDUCATION:  Education details: Objective findings and POC.  Person educated: Patient Education method: Explanation, demonstration Education comprehension: verbalized understanding, returned demonstration, and needs further education   HOME EXERCISE PROGRAM: Access Code: [email or web address] URL: [email or web address] Date: 12/01/2021 Prepared by: Ronny Flurry  Exercises - Supine Transversus Abdominis Bracing - Hands on Stomach  - 2 x daily - 7 x weekly - 2 sets - 10 reps - Supine March  - 1-2 x daily - 7 x weekly - 2 sets - 5-10 reps - Seated Hip Abduction with Resistance  - 1 x daily - 4 x weekly - 2 sets - 10 reps - Seated Knee Extension with Resistance  - 1 x daily - 3-4 x weekly - 2 sets -  10 reps - Seated March  - 1 x daily - 3-4 x weekly - 2 sets - 10 reps - Seated Heel Raise  - 1 x daily - 7 x weekly - 2 sets - 10 reps  ASSESSMENT:  CLINICAL IMPRESSION: Pt is improving with function as evidenced by subjective reports of feeling more steady with gait and ambulating increased distance.  He continues to have limited bilat ankle DF ROM (R>L), though demonstrated improved R ankle DF with toe raises in sitting and S/L'ing DF.  Pt is improving with tolerance to standing exercises and demonstrates improved balance requiring less UE support.  He has limited height with standing heel raises.  He responded well to Rx reporting no increased pain though does report LE fatigue.  He should  Pt reports feeling more steady with ambulation and is ambulating increased distance including in grocery store.  Pt has improved tolerance with and performance of household chores.  Pt denies any falls since starting PT. Pt continues to have weakness in bilat LE's.  He had improved bilat hip strength though worse bilat ankle strength.  He continues to have decreased DF in bilat ankles, worse on R.  Pt had improved R elbow strength and worse bilat shoulder abd strength.  Pt demonstrates improved ambulation distance on 6 MWT from 454 ft initially to 550 ft currently.  Pt demonstrates improved self perceived disability with FOTO score improving from 59 to 63 and nearly met goal of 64.  Pt is planning on getting a lumbar injection soon due to his lumbar pain.  Pt partially met STG #2 and LTG #3 and met LTG #4.  Pt continues to have balance deficits and is a fall risk though did improve his Berg Balance score from 27/56 to 33/56.  Pt should benefit from cont skilled PT services to address ongoing goals and improve overall function.        OBJECTIVE IMPAIRMENTS Abnormal gait, cardiopulmonary status limiting activity, decreased balance, decreased coordination, decreased knowledge of use of DME, decreased mobility,  difficulty walking, decreased strength, decreased safety awareness, increased edema, and impaired UE functional use.   ACTIVITY LIMITATIONS standing, squatting, stairs, transfers, bed mobility, reach over head, and locomotion level  PARTICIPATION LIMITATIONS: shopping, community activity, and yard work  PERSONAL FACTORS Age, Behavior pattern, Fitness, Past/current experiences, and Time since onset of injury/illness/exacerbation are also affecting patient's functional outcome.   REHAB POTENTIAL: Good  CLINICAL DECISION MAKING: Evolving/moderate complexity  EVALUATION COMPLEXITY: Moderate   GOALS: Goals reviewed with patient? Yes  SHORT TERM GOALS: Target date: 01/03/2022  Will be compliant with appropriate progressive HEP to include walking vs floor bike program  Baseline: Goal status: PROGRESSING  2.  Will be able to name 3 ways to reduce fall risk at home and in community  Baseline:  Goal status: PARTIALLY MET  3.  Will demonstrate improved ankle dorsiflexor endurance and reduce related fall risk as evidenced by elimination of foot slap during 3MWT  Baseline:  Goal status: NOT MET  4. Will be independent with alternative ways to control edema in BLEs (such as spiral wrapping with ACE bandages) Baseline:  Goal status: not met    LONG TERM GOALS: Target date: 02/07/2022  MMT to improve by at least 1 grade in all weak groups  Baseline:  Goal status: NOT MET  2.  Will be able to ambulate at least 560f safely in 3MWT with use of SPC vs SBQC with no more than S level assist to improve community access  Baseline:  Goal status: performed with rollator but not met with cane  3.  Will score at least 37 on the Berg to show improved balance/reduced fall risk  Baseline:  Goal status:60% MET  4.  Will demonstrate improved UE strength as evidenced by ability to lift rollator in/out of car for improved community access (for extended gait distances 10068f+) Baseline:  Goal  status: GOAL MET  5.  Will be compliant with advanced HEP vs gym program to maintain functional gains and prevent recurrence of impairments  Baseline:  Goal status: ongoing     PLAN: PT FREQUENCY: 2x/week  PT DURATION: 5 weeks  PLANNED INTERVENTIONS: Therapeutic exercises, Therapeutic activity, Neuromuscular re-education, Balance training, Gait training, Patient/Family education, Self Care, Joint mobilization, Stair training, DME instructions, Aquatic Therapy, Taping, Ionotophoresis 51m50ml Dexamethasone, Manual therapy, and Re-evaluation.  PLAN FOR NEXT SESSION: Cont  to progress strength, fxnl endurance, and balance. Strengthen bilat hips, quad, andankle dorsiflexors, may need to consider AFOs in the future if gait/ankle strength does not improve significantly with PT.     Selinda Michaels III PT, DPT 01/03/22 9:24 PM

## 2022-01-04 ENCOUNTER — Encounter (HOSPITAL_BASED_OUTPATIENT_CLINIC_OR_DEPARTMENT_OTHER): Payer: Self-pay | Admitting: Physical Therapy

## 2022-01-04 ENCOUNTER — Ambulatory Visit (HOSPITAL_BASED_OUTPATIENT_CLINIC_OR_DEPARTMENT_OTHER): Payer: Medicare Other | Admitting: Physical Therapy

## 2022-01-04 DIAGNOSIS — R296 Repeated falls: Secondary | ICD-10-CM

## 2022-01-04 DIAGNOSIS — R2681 Unsteadiness on feet: Secondary | ICD-10-CM

## 2022-01-04 DIAGNOSIS — M6281 Muscle weakness (generalized): Secondary | ICD-10-CM

## 2022-01-04 DIAGNOSIS — M5459 Other low back pain: Secondary | ICD-10-CM

## 2022-01-04 NOTE — Therapy (Signed)
OUTPATIENT PHYSICAL THERAPY TREATMENT NOTE         Patient Name: Charles Lloyd. MRN: [id number] DOB:13-Oct-1940, 81 y.o., male Today's Date: 01/05/2022   PT End of Session - 01/04/22 1336     Visit Number 11    Number of Visits 20    Date for PT Re-Evaluation 02/07/22    Authorization Type [employer] MCR    Progress Note Due on Visit 20    PT Start Time 1303    PT Stop Time 1344    PT Time Calculation (min) 41 min    Activity Tolerance Patient tolerated treatment well    Behavior During Therapy WFL for tasks assessed/performed                 Past Medical History:  Diagnosis Date   Acute tracheobronchitis 01/05/2019   B12 deficiency    Basal cell carcinoma (BCC) of left side of nose 01/28/2018   Cancer ([place])    Cardiomyopathy ([place]) 03/10/2018   With chronic atrial fibrillation   CHF (congestive heart failure) ([place]) 04/16/2013   Functional class II, ejection fraction 35-40%  Formatting of this note might be different from the original. Functional class II, ejection fraction 35-40%   Chronic anticoagulation    Chronic diastolic (congestive) heart failure ([place]) 04/16/2013   Functional class II, ejection fraction 35-40%  Last Assessment & Plan:  Clinically stable Volume well controlled meds reviewed   Chronic diastolic CHF (congestive heart failure) ([place]) 04/16/2013   Functional class II, ejection fraction 35-40%  Last Assessment & Plan:  Clinically stable Volume well controlled meds reviewed   Colon polyps 04/16/2013   Diabetic neuropathy (HCC)    Eosinophil count raised 02/17/2019   Essential hypertension 01/06/2010   Last Assessment & Plan:  Well controlled Continue med management   Gout    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  Steroid usage was discontinued several months ago.  Follow up with Dermatology as planned.   Hypercholesterolemia 01/06/2010   Last Assessment & Plan:  Repeat labs recommended    Hyperlipidemia associated with type 2 diabetes mellitus ([place]) 01/28/2018   Hypertensive heart disease with heart failure ([place]) 01/06/2010   Last Assessment & Plan:  Well controlled Continue med management   Hyponatremia 12/17/2018   Infected prosthetic knee joint ([place]) 06/24/2017   Last Assessment & Plan:  Id following ?ongoing abx managementy reported Request records   Infection of prosthetic right knee joint ([place])    Insomnia 03/04/2012   Grief with loss of wife 02/20/12  Last Assessment & Plan:  Improved sx  contineu xanax prn Se discussed   Lesion of liver 04/20/2019   -On cardiac CT 02/2019   Mild reactive airways disease    Neoplasm of prostate 04/16/2013   Neoplasm of prostate, malignant ([place]) 01/06/2010   [name] S/p radiation therapy   Last Assessment & Plan:  Repeat psa today No urinary sx Pt reported fatigue/poor appetite   On amiodarone therapy 09/07/2013   On continuous oral anticoagulation 01/28/2018   Other activity(E029.9) 04/20/2013   Formatting of this note might be different from the original. Transthoracic Echocardiogram-03/10/[facility]: Normal left ventricular wall thickness and cavity size.  Global left ventricular systolic function is moderately reduced.  The estimated ejection fraction is 35-40%.  The left atrium is moderately enlarged.  The right atrium is mildly enlarged.  No significant valvular    Overweight (BMI 25.0-29.9) 10/22/2016  Persistent atrial fibrillation ([place]) 04/16/2013   Last Assessment & Plan:  Rate controlled Continue med management eliquis for stroke preventino  Formatting of this note might be different from the original.  Drug  HX Current Rx Pre-ABL inefficacy Pre-ABL intolerant Post-ABL inefficacy Post-ABL intolerant max dose/24h M/Y end comments  sotalol                  dofetilide                  flecainide                  propafenone                  am   S/P TKR (total knee replacement), bilateral    Secondary  hypercoagulable state ([place]) 04/09/2019   Tinea cruris 04/16/2013   Type 2 diabetes mellitus with diabetic polyneuropathy, without long-term current use of insulin ([place]) 04/16/2013   Last Assessment & Plan:  Labs today Pt reports well controlled on ambulatory monitoring   Vitamin D deficiency 07/25/2018   Past Surgical History:  Procedure Laterality Date   ATRIAL FIBRILLATION ABLATION N/A 03/12/2019   Procedure: ATRIAL FIBRILLATION ABLATION;  Surgeon: [name], MD;  Location: [place] [facility];  Service: Cardiovascular;  Laterality: N/A;   ATRIAL FIBRILLATION ABLATION N/A 04/29/2020   Procedure: ATRIAL FIBRILLATION ABLATION;  Surgeon: [name], MD;  Location: [place] [facility];  Service: Cardiovascular;  Laterality: N/A;   CARDIAC ELECTROPHYSIOLOGY STUDY AND ABLATION     CARDIOVERSION     CARDIOVERSION N/A 10/10/2018   Procedure: CARDIOVERSION;  Surgeon: [name], MD;  Location[facility];  Service: Cardiovascular;  Laterality: N/A;   CARDIOVERSION N/A 05/18/2020   Procedure: CARDIOVERSION;  Surgeon: [name], MD;  Location: [place];  Service: Cardiovascular;  Laterality: N/A;   CARDIOVERSION N/A 12/09/2020   Procedure: CARDIOVERSION;  Surgeon: Lelon Perla, MD;  Location: [facility] ENDOSCOPY;  Service: Cardiovascular;  Laterality: N/A;   CHOLECYSTECTOMY     PROSTATECTOMY     REPLACEMENT TOTAL KNEE BILATERAL     Patient Active Problem List   Diagnosis Date Noted   Acute renal failure superimposed on stage 3b chronic kidney disease ([place]) 11/16/2021   Uremia 11/16/2021   Gout due to renal impairment 10/17/2021   Pancytopenia ([place]) 08/05/2021   SIRS (systemic inflammatory response syndrome) ([place]) 08/04/2021   Chronic kidney disease, stage 3b ([place]) 12/30/2020   Multiple falls    Spinal stenosis    S/P TKR (total knee replacement), bilateral    Arthralgia 11/20/2019   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   Lesion of liver 04/20/2019   Secondary  hypercoagulable state ([place]) 04/09/2019   Eosinophil count raised 02/17/2019   Acute tracheobronchitis 01/05/2019   Asthma    Vitamin D deficiency 07/25/2018   Chronic anticoagulation 03/06/2018   Hyperlipidemia associated with type 2 diabetes mellitus ([place]) 01/28/2018   Basal cell carcinoma (BCC) of left side of nose 01/28/2018   Infected prosthetic knee joint ([place]) 06/24/2017   B12 deficiency 10/22/2016   Overweight (BMI 25.0-29.9) 10/22/2016   Primary osteoarthritis of right knee 03/29/2015   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   Paroxysmal A-fib ([place]) 04/16/2013   Chronic diastolic (congestive) heart failure ([place]) 04/16/2013   Colon polyps 04/16/2013   NIDDM-2 with polyneuropathy and hyperglycemia 04/16/2013   Tinea cruris 04/16/2013   Diabetic neuropathy ([place]) 07/14/2012   Insomnia 03/04/2012   Hypertensive heart disease with heart failure ([place]) 01/06/2010  Neoplasm of prostate, malignant ([place]) 01/06/2010   Hypercholesterolemia 01/06/2010   Essential hypertension 01/06/2010    PCP: Isaac Bliss, [place] Id[facility]   REFERRING PROVIDER: Reece Agar, MD   REFERRING DIAG: [phone] (ICD-10-CM) - Spinal stenosis, lumbar region with neurogenic claudication   Rationale for Evaluation and Treatment Rehabilitation  THERAPY DIAG:  Unsteadiness on feet  Repeated falls  Muscle weakness (generalized)  Other low back pain  ONSET DATE: 11/15/2021   SUBJECTIVE:                                                                                                                                                                                           SUBJECTIVE STATEMENT: Pt feels more steady with ambulation and is ambulating increased distance.  Pt is able to ambulate increased distance in grocery store.  Pt vacuumed and dusted his town home and felt more steady performing cleaning.  Pt has had no falls since starting PT.  Pt reports he is able to lift rollator into car and also take it out of car.    Pt reports he had increased back pain yesterday evening for no specific reason.  He had difficulty sleeping due to pain.  He had back spasms.  Pt states it's nothing we do in therapy.  He denies any adverse effects after prior Rx.  He woke up this AM with his back feeling better, like a toothache.  Pt has difficulty with ascending stairs but feels good with descending stairs.  He uses a rail with stairs.    PERTINENT HISTORY:  Lumbar anterolisthesis, Peripheral neuropathy, TKR bilat, A-fib, CHF, DM  PAIN:  Are you having pain? Yes: NPRS scale: Current:  6/10, Best:  1/10, Worst:  6/10 Pain location: central lumbar  Pain description: dull, achy Aggravating factors: none  Relieving factors: injections   FOTO: INITIAL/CURRENT:  59/63 ; Goal of 64 at visit #10    PRECAUTIONS: Fall and Other: severe neuropathy   WEIGHT BEARING RESTRICTIONS No  FALLS:  Has patient fallen in last 6 months? Yes. Number of falls 3.5 (caught himself on one fall, on the other 3 falls my legs just gave way like someone took my legs out from under me)  LIVING ENVIRONMENT: Lives with: lives alone and family is near by and can help him easily  Lives in: House/apartment Stairs:  two story townhouse with stair lift inside, no STE but does have to deal with one step going up to chair lift Has following equipment at home: Single point cane, Walker - 4 wheeled, and stair lift, raised toilet seats, shower stool   OCCUPATION: retired   PLOF: Independent,  Independent with basic ADLs, and Requires assistive device for independence  PATIENT GOALS stop falling, try to gain some strength   OBJECTIVE:   DIAGNOSTIC FINDINGS:  IMPRESSION: 1. 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 2. Stable to slight progression of severe spinal stenosis and moderate neural foraminal stenosis at L2-3. 3. Slight progression of moderate spinal stenosis at L3-4.    TODAY'S TREATMENT   Pt performed: Nustep L4 x 5 mins  with bilat UE/Les Seated BAPS f/b approx 30 reps  Ankle DF with T band with YTB 2x10 on L  Seated LAQ 5# 3x10 bilat  R ankle DF in L S/L'ing x30 reps  Sidestepping with Ue's on rail x 2 laps Standing heel raises 3x10     Neuro Re-ed Activities:            Standing with FT without UE support 2x30 sec on airex             Weight shifts f/b x 10-15 reps and s/s x 10-15 reps without UE support with SBA and min Assist for LOB with f/b            Tandem stance with UE support and SBA/CGA, occasional min assist 2x20 sec             Marching on airex 2x10 reps with UE support      Alt toe taps on 6 inch step with occasional UE support and min to mod assist for LOB     PATIENT EDUCATION:  Education details: Objective findings and POC.  Person educated: Patient Education method: Explanation, demonstration Education comprehension: verbalized understanding, returned demonstration, and needs further education   HOME EXERCISE PROGRAM: Access Code: [email or web address] URL: [email or web address] Date: 12/01/2021 Prepared by: Ronny Flurry  Exercises - Supine Transversus Abdominis Bracing - Hands on Stomach  - 2 x daily - 7 x weekly - 2 sets - 10 reps - Supine March  - 1-2 x daily - 7 x weekly - 2 sets - 5-10 reps - Seated Hip Abduction with Resistance  - 1 x daily - 4 x weekly - 2 sets - 10 reps - Seated Knee Extension with Resistance  - 1 x daily - 3-4 x weekly - 2 sets - 10 reps - Seated March  - 1 x daily - 3-4 x weekly - 2 sets - 10 reps - Seated Heel Raise  - 1 x daily - 7 x weekly - 2 sets - 10 reps  ASSESSMENT:  CLINICAL IMPRESSION: Pt has had recent increase in back pain though is feeling better currently.  He is scheduled for an injection on 11/20.  Pt is improving with function as evidenced by subjective reports.  Pt continues to have foot drop bilat, worse on R.  He did demo improved R DF AROM in S/L'ing.  Pt has balance deficits having difficulty with alt toe tapping and  tandem stance.  He was unable to tap foot on step but placed half of his foot on step and required PT assist with performing alt toe tapping.  Pt responded well to Rx reporting no increased pain after Rx.  Pt should benefit from cont skilled PT services to address ongoing goals and improve overall function.        OBJECTIVE IMPAIRMENTS Abnormal gait, cardiopulmonary status limiting activity, decreased balance, decreased coordination, decreased knowledge of use of DME, decreased mobility, difficulty walking, decreased strength, decreased safety awareness, increased edema, and impaired UE functional use.   ACTIVITY LIMITATIONS standing, squatting,  stairs, transfers, bed mobility, reach over head, and locomotion level  PARTICIPATION LIMITATIONS: shopping, community activity, and yard work  PERSONAL FACTORS Age, Behavior pattern, Fitness, Past/current experiences, and Time since onset of injury/illness/exacerbation are also affecting patient's functional outcome.   REHAB POTENTIAL: Good  CLINICAL DECISION MAKING: Evolving/moderate complexity  EVALUATION COMPLEXITY: Moderate   GOALS: Goals reviewed with patient? Yes  SHORT TERM GOALS: Target date: 01/03/2022  Will be compliant with appropriate progressive HEP to include walking vs floor bike program  Baseline: Goal status: PROGRESSING  2.  Will be able to name 3 ways to reduce fall risk at home and in community  Baseline:  Goal status: PARTIALLY MET  3.  Will demonstrate improved ankle dorsiflexor endurance and reduce related fall risk as evidenced by elimination of foot slap during 3MWT  Baseline:  Goal status: NOT MET  4. Will be independent with alternative ways to control edema in BLEs (such as spiral wrapping with ACE bandages) Baseline:  Goal status: not met    LONG TERM GOALS: Target date: 02/07/2022  MMT to improve by at least 1 grade in all weak groups  Baseline:  Goal status: NOT MET  2.  Will be able to ambulate  at least 53f safely in 3MWT with use of SPC vs SBQC with no more than S level assist to improve community access  Baseline:  Goal status: performed with rollator but not met with cane  3.  Will score at least 37 on the Berg to show improved balance/reduced fall risk  Baseline:  Goal status:60% MET  4.  Will demonstrate improved UE strength as evidenced by ability to lift rollator in/out of car for improved community access (for extended gait distances 10024f+) Baseline:  Goal status: GOAL MET  5.  Will be compliant with advanced HEP vs gym program to maintain functional gains and prevent recurrence of impairments  Baseline:  Goal status: ongoing     PLAN: PT FREQUENCY: 2x/week  PT DURATION: 5 weeks  PLANNED INTERVENTIONS: Therapeutic exercises, Therapeutic activity, Neuromuscular re-education, Balance training, Gait training, Patient/Family education, Self Care, Joint mobilization, Stair training, DME instructions, Aquatic Therapy, Taping, Ionotophoresis 16m33ml Dexamethasone, Manual therapy, and Re-evaluation.  PLAN FOR NEXT SESSION: Cont to progress strength, fxnl endurance, and balance. Strengthen bilat hips, quad, andankle dorsiflexors, may need to consider AFOs in the future if gait/ankle strength does not improve significantly with PT.     RobSelinda MichaelsI PT, DPT 01/05/22 1:19 PM

## 2022-01-08 ENCOUNTER — Ambulatory Visit: Payer: Medicare Other | Admitting: Internal Medicine

## 2022-01-08 VITALS — BP 173/94 | HR 82 | Temp 98.2°F | Wt 178.3 lb

## 2022-01-08 DIAGNOSIS — M109 Gout, unspecified: Secondary | ICD-10-CM

## 2022-01-08 DIAGNOSIS — E1142 Type 2 diabetes mellitus with diabetic polyneuropathy: Secondary | ICD-10-CM | POA: Diagnosis not present

## 2022-01-08 LAB — POCT GLYCOSYLATED HEMOGLOBIN (HGB A1C): Hemoglobin A1C: 5.6 % (ref 4.0–5.6)

## 2022-01-08 MED ORDER — COLCHICINE 0.6 MG PO TABS: 0.6000 mg | ORAL_TABLET | Freq: Two times a day (BID) | ORAL | 2 refills | Status: DC

## 2022-01-08 NOTE — Progress Notes (Signed)
Acute Office Visit     CC/Reason for Visit: Pain 4th toe left foot  HPI: Charles Lloyd. is a 81 y.o. male who is coming in today for the above mentioned reasons. Known gout with frequent flares. Already on allopurinol. 3 days ago started having pain, swelling and redness of the left fourth toe.   Past Medical/Surgical History: Past Medical History:  Diagnosis Date   Acute tracheobronchitis 01/05/2019   B12 deficiency    Basal cell carcinoma (BCC) of left side of nose 01/28/2018   Cancer ([place])    Cardiomyopathy ([place]) 03/10/2018   With chronic atrial fibrillation   CHF (congestive heart failure) ([place]) 04/16/2013   Functional class II, ejection fraction 35-40%  Formatting of this note might be different from the original. Functional class II, ejection fraction 35-40%   Chronic anticoagulation    Chronic diastolic (congestive) heart failure ([place]) 04/16/2013   Functional class II, ejection fraction 35-40%  Last Assessment & Plan:  Clinically stable Volume well controlled meds reviewed   Chronic diastolic CHF (congestive heart failure) ([place]) 04/16/2013   Functional class II, ejection fraction 35-40%  Last Assessment & Plan:  Clinically stable Volume well controlled meds reviewed   Colon polyps 04/16/2013   Diabetic neuropathy (HCC)    Eosinophil count raised 02/17/2019   Essential hypertension 01/06/2010   Last Assessment & Plan:  Well controlled Continue med management   Gout    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  Steroid usage was discontinued several months ago.  Follow up with Dermatology as planned.   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   Last Assessment & Plan:  Id following ?ongoing abx managementy reported Request records   Infection of prosthetic right knee joint ([place])    Insomnia 03/04/2012   Grief with loss of wife 02/20/12  Last Assessment & Plan:  Improved sx  contineu xanax prn Se discussed   Lesion of liver 04/20/2019   -On cardiac CT 02/2019   Mild reactive airways disease    Neoplasm of prostate 04/16/2013   Neoplasm of prostate, malignant ([place]) 01/06/2010   [name] S/p radiation therapy   Last Assessment & Plan:  Repeat psa today No urinary sx Pt reported fatigue/poor appetite   On amiodarone therapy 09/07/2013   On continuous oral anticoagulation 01/28/2018   Other activity(E029.9) 04/20/2013   Formatting of this note might be different from the original. Transthoracic Echocardiogram-03/10/2013-[facility]: Normal left ventricular wall thickness and cavity size.  Global left ventricular systolic function is moderately reduced.  The estimated ejection fraction is 35-40%.  The left atrium is moderately enlarged.  The right atrium is mildly enlarged.  No significant valvular    Overweight (BMI 25.0-29.9) 10/22/2016   Persistent atrial fibrillation ([place]) 04/16/2013   Last Assessment & Plan:  Rate controlled Continue med management eliquis for stroke preventino  Formatting of this note might be different from the original.  Drug  HX Current Rx Pre-ABL inefficacy Pre-ABL intolerant Post-ABL inefficacy Post-ABL intolerant max dose/24h M/Y end comments  sotalol  dofetilide                  flecainide                  propafenone                  am   S/P TKR (total knee replacement), bilateral    Secondary hypercoagulable state ([place]) 04/09/2019   Tinea cruris 04/16/2013   Type 2 diabetes mellitus with diabetic polyneuropathy, without long-term current use of insulin ([place]) 04/16/2013   Last Assessment & Plan:  Labs today Pt reports  well controlled on ambulatory monitoring   Vitamin D deficiency 07/25/2018    Past Surgical History:  Procedure Laterality Date   ATRIAL FIBRILLATION ABLATION N/A 03/12/2019   Procedure: ATRIAL FIBRILLATION ABLATION;  Surgeon: [name], MD;  Location: [facility];  Service: Cardiovascular;  Laterality: N/A;   ATRIAL FIBRILLATION ABLATION N/A 04/29/2020   Procedure: ATRIAL FIBRILLATION ABLATION;  Surgeon: [name], MD;  Location: [facility];  Service: Cardiovascular;  Laterality: N/A;   CARDIAC ELECTROPHYSIOLOGY STUDY AND ABLATION     CARDIOVERSION     CARDIOVERSION N/A 10/10/2018   Procedure: CARDIOVERSION;  Surgeon: [name], MD;  Location: [facility] ENDOSCOPY;  Service: Cardiovascular;  Laterality: N/A;   CARDIOVERSION N/A 05/18/2020   Procedure: CARDIOVERSION;  Surgeon: [name], MD;  Location: [facility] ENDOSCOPY;  Service: Cardiovascular;  Laterality: N/A;   CARDIOVERSION N/A 12/09/2020   Procedure: CARDIOVERSION;  Surgeon: Lelon Perla, MD;  Location: [facility] ENDOSCOPY;  Service: Cardiovascular;  Laterality: N/A;   CHOLECYSTECTOMY     PROSTATECTOMY     REPLACEMENT TOTAL KNEE BILATERAL      Social History:  reports that he has never smoked. He has never used smokeless tobacco. He reports current alcohol use of about 14.0 standard drinks of alcohol per week. He reports that he does not use drugs.  Allergies: No Known Allergies  Family History:  Family History  Problem Relation Age of Onset   Cancer Mother    Depression Mother    Early death Mother    Cancer Father    Depression Father    Early death Father      Current Outpatient Medications:    ACCU-CHEK AVIVA PLUS test strip, 1 EACH BY OTHER ROUTE DAILY. DX E11.9, Disp: 100 strip, Rfl: 12   albuterol (VENTOLIN HFA) 108 (90 Base) MCG/ACT inhaler, Inhale 1-2 puffs into the lungs every 6 (six) hours as needed for wheezing or shortness of breath., Disp: 8 g, Rfl: 2   allopurinol  (ZYLOPRIM) 300 MG tablet, Take 1 tablet (300 mg total) by mouth daily., Disp: 90 tablet, Rfl: 1   atorvastatin (LIPITOR) 20 MG tablet, TAKE 1 TABLET DAILY, Disp: 90 tablet, Rfl: 0   carvedilol (COREG) 3.125 MG tablet, TAKE 1 TABLET BY MOUTH 2 TIMES DAILY., Disp: 180 tablet, Rfl: 0   colchicine 0.6 MG tablet, Take 1 tablet (0.6 mg total) by mouth 2 (two) times daily., Disp: 60 tablet, Rfl: 2   Continuous Blood Gluc Receiver (FREESTYLE LIBRE 2 READER) DEVI, 1 each by Does not apply route daily., Disp: 1 each, Rfl: 2   Continuous Blood Gluc Sensor (FREESTYLE LIBRE 2 SENSOR) MISC, 1 each by Does not apply route daily., Disp: 2 each, Rfl: 6   ELIQUIS 5 MG TABS tablet, TAKE 1 TABLET TWICE A DAY (Patient taking differently: Take 5 mg by mouth 2 (two) times daily.), Disp:  180 tablet, Rfl: 3   flecainide (TAMBOCOR) 100 MG tablet, TAKE 1 TABLET BY MOUTH TWICE A DAY, Disp: 180 tablet, Rfl: 3   fluticasone (FLONASE) 50 MCG/ACT nasal spray, Place 2 sprays into both nostrils daily., Disp: 16 g, Rfl: 2   fluticasone furoate-vilanterol (BREO ELLIPTA) 100-25 MCG/INH AEPB, Inhale 1 puff into the lungs daily., Disp: 60 each, Rfl: 11   gabapentin (NEURONTIN) 300 MG capsule, TAKE 2 CAPSULES BY MOUTH AT BEDTIME (Patient taking differently: Take 600 mg by mouth at bedtime.), Disp: 60 capsule, Rfl: 2   insulin glargine (LANTUS SOLOSTAR) 100 UNIT/ML Solostar Pen, Inject 8 Units into the skin daily., Disp: 15 mL, Rfl: 0   KLOR-CON M20 20 MEQ tablet, TAKE 1 TABLET BY MOUTH EVERY DAY, Disp: 90 tablet, Rfl: 3   montelukast (SINGULAIR) 10 MG tablet, Take 10 mg by mouth at bedtime. , Disp: , Rfl:    RESTASIS 0.05 % ophthalmic emulsion, Place 1 drop into both eyes 2 (two) times daily as needed (dry eyes)., Disp: , Rfl:    torsemide (DEMADEX) 20 MG tablet, Take 1 tablet (20 mg total) by mouth daily. Start taking from Monday only, Disp: 180 tablet, Rfl: 3  Review of Systems:  Constitutional: Denies fever, chills, diaphoresis,  appetite change and fatigue.  HEENT: Denies photophobia, eye pain, redness, hearing loss, ear pain, congestion, sore throat, rhinorrhea, sneezing, mouth sores, trouble swallowing, neck pain, neck stiffness and tinnitus.   Respiratory: Denies SOB, DOE, cough, chest tightness,  and wheezing.   Cardiovascular: Denies chest pain, palpitations and leg swelling.  Gastrointestinal: Denies nausea, vomiting, abdominal pain, diarrhea, constipation, blood in stool and abdominal distention.  Genitourinary: Denies dysuria, urgency, frequency, hematuria, flank pain and difficulty urinating.  Endocrine: Denies: hot or cold intolerance, sweats, changes in hair or nails, polyuria, polydipsia. Skin: Denies pallor, rash and wound.  Neurological: Denies dizziness, seizures, syncope, weakness, light-headedness, numbness and headaches.  Hematological: Denies adenopathy. Easy bruising, personal or family bleeding history  Psychiatric/Behavioral: Denies suicidal ideation, mood changes, confusion, nervousness, sleep disturbance and agitation    Physical Exam: Vitals:   01/08/22 1605 01/08/22 1606  BP: (!) 180/90 (!) 173/94  Pulse: 82   Temp: 98.2 F (36.8 C)   TempSrc: Oral   SpO2: 98%   Weight: 178 lb 4.8 oz (80.9 kg)     Body mass index is 25.58 kg/m.   Constitutional: NAD, calm, comfortable Eyes: PERRL, lids and conjunctivae normal ENMT: Mucous membranes are moist.  Skin: Edema and erythema of left fourth toe.   Impression and Plan:  Acute gout involving toe of left foot, unspecified cause - Plan: colchicine 0.6 MG tablet  NIDDM-2 with polyneuropathy and hyperglycemia - Plan: POCT glycosylated hemoglobin (Hb A1C)  -Treat acute gout attack with colchicine 0.6 mg BID. -Follow up if no improvement. -A1c of 5.6 demonstrates excellent glycemic control.  Time spent:22 minutes reviewing chart, interviewing and examining patient and formulating plan of care.     Lelon Frohlich,  MD [facility]

## 2022-01-09 ENCOUNTER — Ambulatory Visit (HOSPITAL_BASED_OUTPATIENT_CLINIC_OR_DEPARTMENT_OTHER): Payer: Medicare Other | Admitting: Physical Therapy

## 2022-01-09 ENCOUNTER — Encounter (HOSPITAL_BASED_OUTPATIENT_CLINIC_OR_DEPARTMENT_OTHER): Payer: Medicare Other | Admitting: Physical Therapy

## 2022-01-11 ENCOUNTER — Ambulatory Visit (HOSPITAL_BASED_OUTPATIENT_CLINIC_OR_DEPARTMENT_OTHER): Payer: Medicare Other | Admitting: Physical Therapy

## 2022-01-11 ENCOUNTER — Other Ambulatory Visit: Payer: Self-pay | Admitting: Internal Medicine

## 2022-01-11 ENCOUNTER — Encounter (HOSPITAL_BASED_OUTPATIENT_CLINIC_OR_DEPARTMENT_OTHER): Payer: Medicare Other | Admitting: Physical Therapy

## 2022-01-11 DIAGNOSIS — M1A0411 Idiopathic chronic gout, right hand, with tophus (tophi): Secondary | ICD-10-CM

## 2022-01-15 ENCOUNTER — Encounter (HOSPITAL_BASED_OUTPATIENT_CLINIC_OR_DEPARTMENT_OTHER): Payer: Medicare Other | Admitting: Physical Therapy

## 2022-01-17 ENCOUNTER — Encounter (HOSPITAL_BASED_OUTPATIENT_CLINIC_OR_DEPARTMENT_OTHER): Payer: Self-pay | Admitting: Physical Therapy

## 2022-01-17 ENCOUNTER — Ambulatory Visit (HOSPITAL_BASED_OUTPATIENT_CLINIC_OR_DEPARTMENT_OTHER): Payer: Medicare Other | Admitting: Physical Therapy

## 2022-01-17 DIAGNOSIS — M6281 Muscle weakness (generalized): Secondary | ICD-10-CM

## 2022-01-17 DIAGNOSIS — M5459 Other low back pain: Secondary | ICD-10-CM

## 2022-01-17 DIAGNOSIS — R2681 Unsteadiness on feet: Secondary | ICD-10-CM | POA: Diagnosis not present

## 2022-01-17 DIAGNOSIS — R296 Repeated falls: Secondary | ICD-10-CM

## 2022-01-17 NOTE — Therapy (Signed)
OUTPATIENT PHYSICAL THERAPY TREATMENT NOTE         Patient Name: Charles Lloyd. MRN: [id number] DOB:31-Jan-1941, 81 y.o., male Today's Date: 01/17/2022   PT End of Session - 01/17/22 1324     Visit Number 12    Number of Visits 20    Date for PT Re-Evaluation 02/07/22    Authorization Type [employer] MCR    Progress Note Due on Visit 20    PT Start Time 1302    PT Stop Time 1334    PT Time Calculation (min) 32 min    Activity Tolerance Patient tolerated treatment well    Behavior During Therapy WFL for tasks assessed/performed                  Past Medical History:  Diagnosis Date   Acute tracheobronchitis 01/05/2019   B12 deficiency    Basal cell carcinoma (BCC) of left side of nose 01/28/2018   Cancer ([place])    Cardiomyopathy ([place]) 03/10/2018   With chronic atrial fibrillation   CHF (congestive heart failure) ([place]) 04/16/2013   Functional class II, ejection fraction 35-40%  Formatting of this note might be different from the original. Functional class II, ejection fraction 35-40%   Chronic anticoagulation    Chronic diastolic (congestive) heart failure ([place]) 04/16/2013   Functional class II, ejection fraction 35-40%  Last Assessment & Plan:  Clinically stable Volume well controlled meds reviewed   Chronic diastolic CHF (congestive heart failure) ([place]) 04/16/2013   Functional class II, ejection fraction 35-40%  Last Assessment & Plan:  Clinically stable Volume well controlled meds reviewed   Colon polyps 04/16/2013   Diabetic neuropathy (HCC)    Eosinophil count raised 02/17/2019   Essential hypertension 01/06/2010   Last Assessment & Plan:  Well controlled Continue med management   Gout    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  Steroid usage was discontinued several months ago.  Follow up with Dermatology as planned.   Hypercholesterolemia 01/06/2010   Last Assessment & Plan:  Repeat labs  recommended   Hyperlipidemia associated with type 2 diabetes mellitus ([place]) 01/28/2018   Hypertensive heart disease with heart failure ([place]) 01/06/2010   Last Assessment & Plan:  Well controlled Continue med management   Hyponatremia 12/17/2018   Infected prosthetic knee joint ([place]) 06/24/2017   Last Assessment & Plan:  Id following ?ongoing abx managementy reported Request records   Infection of prosthetic right knee joint ([place])    Insomnia 03/04/2012   Grief with loss of wife 02/20/12  Last Assessment & Plan:  Improved sx  contineu xanax prn Se discussed   Lesion of liver 04/20/2019   -On cardiac CT 02/2019   Mild reactive airways disease    Neoplasm of prostate 04/16/2013   Neoplasm of prostate, malignant ([place]) 01/06/2010   [name] S/p radiation therapy   Last Assessment & Plan:  Repeat psa today No urinary sx Pt reported fatigue/poor appetite   On amiodarone therapy 09/07/2013   On continuous oral anticoagulation 01/28/2018   Other activity(E029.9) 04/20/2013   Formatting of this note might be different from the original. Transthoracic Echocardiogram-03/10/2013-[facility]: Normal left ventricular wall thickness and cavity size.  Global left ventricular systolic function is moderately reduced.  The estimated ejection fraction is 35-40%.  The left atrium is moderately enlarged.  The right atrium is mildly enlarged.  No significant valvular    Overweight (BMI 25.0-29.9) 10/22/2016  Persistent atrial fibrillation ([place]) 04/16/2013   Last Assessment & Plan:  Rate controlled Continue med management eliquis for stroke preventino  Formatting of this note might be different from the original.  Drug  HX Current Rx Pre-ABL inefficacy Pre-ABL intolerant Post-ABL inefficacy Post-ABL intolerant max dose/24h M/Y end comments  sotalol                  dofetilide                  flecainide                  propafenone                  am   S/P TKR (total knee replacement), bilateral     Secondary hypercoagulable state ([place]) 04/09/2019   Tinea cruris 04/16/2013   Type 2 diabetes mellitus with diabetic polyneuropathy, without long-term current use of insulin ([place]) 04/16/2013   Last Assessment & Plan:  Labs today Pt reports well controlled on ambulatory monitoring   Vitamin D deficiency 07/25/2018   Past Surgical History:  Procedure Laterality Date   ATRIAL FIBRILLATION ABLATION N/A 03/12/2019   Procedure: ATRIAL FIBRILLATION ABLATION;  Surgeon: [name], MD;  Location: [place];  Service: Cardiovascular;  Laterality: N/A;   ATRIAL FIBRILLATION ABLATION N/A 04/29/2020   Procedure: ATRIAL FIBRILLATION ABLATION;  Surgeon: [name], MD;  Location: [place] [facility];  Service: Cardiovascular;  Laterality: N/A;   CARDIAC ELECTROPHYSIOLOGY STUDY AND ABLATION     CARDIOVERSION     CARDIOVERSION N/A 10/10/2018   Procedure: CARDIOVERSION;  Surgeon: [name], MD;  Location: [facility] ENDOSCOPY;  Service: Cardiovascular;  Laterality: N/A;   CARDIOVERSION N/A 05/18/2020   Procedure: CARDIOVERSION;  Surgeon: [name], MD;  Location: [place];  Service: Cardiovascular;  Laterality: N/A;   CARDIOVERSION N/A 12/09/2020   Procedure: CARDIOVERSION;  Surgeon: Lelon Perla, MD;  Location: [facility] ENDOSCOPY;  Service: Cardiovascular;  Laterality: N/A;   CHOLECYSTECTOMY     PROSTATECTOMY     REPLACEMENT TOTAL KNEE BILATERAL     Patient Active Problem List   Diagnosis Date Noted   Acute renal failure superimposed on stage 3b chronic kidney disease ([place]) 11/16/2021   Uremia 11/16/2021   Gout due to renal impairment 10/17/2021   Pancytopenia ([place]) 08/05/2021   SIRS (systemic inflammatory response syndrome) ([place]) 08/04/2021   Chronic kidney disease, stage 3b ([place]) 12/30/2020   Multiple falls    Spinal stenosis    S/P TKR (total knee replacement), bilateral    Arthralgia 11/20/2019   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   Lesion of liver 04/20/2019    Secondary hypercoagulable state ([place]) 04/09/2019   Eosinophil count raised 02/17/2019   Acute tracheobronchitis 01/05/2019   Asthma    Vitamin D deficiency 07/25/2018   Chronic anticoagulation 03/06/2018   Hyperlipidemia associated with type 2 diabetes mellitus ([place]) 01/28/2018   Basal cell carcinoma (BCC) of left side of nose 01/28/2018   Infected prosthetic knee joint ([place]) 06/24/2017   B12 deficiency 10/22/2016   Overweight (BMI 25.0-29.9) 10/22/2016   Primary osteoarthritis of right knee 03/29/2015   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   Paroxysmal A-fib ([place]) 04/16/2013   Chronic diastolic (congestive) heart failure ([place]) 04/16/2013   Colon polyps 04/16/2013   NIDDM-2 with polyneuropathy and hyperglycemia 04/16/2013   Tinea cruris 04/16/2013   Diabetic neuropathy ([place]) 07/14/2012   Insomnia 03/04/2012   Hypertensive heart disease with heart failure ([place]) 01/06/2010  Neoplasm of prostate, malignant ([place]) 01/06/2010   Hypercholesterolemia 01/06/2010   Essential hypertension 01/06/2010    PCP: Isaac Bliss, [facility] Idler   REFERRING PROVIDER: Reece Agar, MD   REFERRING DIAG: [phone] (ICD-10-CM) - Spinal stenosis, lumbar region with neurogenic claudication   Rationale for Evaluation and Treatment Rehabilitation  THERAPY DIAG:  Unsteadiness on feet  Repeated falls  Muscle weakness (generalized)  Other low back pain  ONSET DATE: 11/15/2021   SUBJECTIVE:                                                                                                                                                                                           SUBJECTIVE STATEMENT: Pt has had no falls since starting PT.  He denies any adverse effects after prior Rx.  He reports compliance with HEP.  Pt has difficulty with ascending stairs but feels good with descending stairs.  Pt uses a chair lift at home to perform stairs.  Pt uses a rollator when coming in here due to the terrain, but uses a SPC  in other places.     Pt had gout flare up the Monday before last and saw MD.  He is doing better now.  Pt had lumbar injections on Monday and reports his back is feeling much better.  Pt denies any lumbar pain.  Pt has neuropathy and feels more stable walking barefooted in his house than using his shoes.  His feet feel "spongy" ambulating with his Brooks shoes.  He bought some Nike shoes which are flatter and feels more stable with those shoes.      PERTINENT HISTORY:  Lumbar anterolisthesis, Peripheral neuropathy, TKR bilat, A-fib, CHF, DM  PAIN:  Are you having pain? Yes: NPRS scale: Current:  0/10, Best:  1/10, Worst:  6/10 Pain location: central lumbar  Pain description: dull, achy Aggravating factors: none  Relieving factors: injections   FOTO: INITIAL/CURRENT:  59/63 ; Goal of 64 at visit #10    PRECAUTIONS: Fall and Other: severe neuropathy   WEIGHT BEARING RESTRICTIONS No  FALLS:  Has patient fallen in last 6 months? Yes. Number of falls 3.5 (caught himself on one fall, on the other 3 falls my legs just gave way like someone took my legs out from under me)  LIVING ENVIRONMENT: Lives with: lives alone and family is near by and can help him easily  Lives in: House/apartment Stairs:  two story townhouse with stair lift inside, no STE but does have to deal with one step going up to chair lift Has following equipment at home: Single point cane, Walker - 4 wheeled, and stair lift, raised  toilet seats, shower stool   OCCUPATION: retired   PLOF: Independent, Independent with basic ADLs, and Requires assistive device for independence  PATIENT GOALS stop falling, try to gain some strength   OBJECTIVE:   DIAGNOSTIC FINDINGS:  IMPRESSION: 1. 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 2. Stable to slight progression of severe spinal stenosis and moderate neural foraminal stenosis at L2-3. 3. Slight progression of moderate spinal stenosis at L3-4.    TODAY'S  TREATMENT   Therapeutic Exercise: Pt performed: Nustep L4 x 5 mins with bilat UE/Les Seated BAPS f/b approx 30 reps Seated toe raises 2x10 Seated heel raises 2x10  Ankle DF with T band with YTB 2x10 on L  R ankle DF in L S/L'ing x30 reps     Neuro Re-ed Activities:            Standing with FT without UE support 2x30 sec on airex            Marching on airex 2x10 reps with UE support     Step ups on airex R: x 10 with UE support, L:  x10 without UE support with min assist for LOB and occasional support on rail     PATIENT EDUCATION:  Education details:  exercise form, exercise rationale, and POC.  Person educated: Patient Education method: Explanation, demonstration, verbal and tactile cues Education comprehension: verbalized understanding, returned demonstration, and verbal and visual cues required   HOME EXERCISE PROGRAM: Access Code: [email or web address] URL: [email or web address] Date: 12/01/2021 Prepared by: Ronny Flurry  Exercises - Supine Transversus Abdominis Bracing - Hands on Stomach  - 2 x daily - 7 x weekly - 2 sets - 10 reps - Supine March  - 1-2 x daily - 7 x weekly - 2 sets - 5-10 reps - Seated Hip Abduction with Resistance  - 1 x daily - 4 x weekly - 2 sets - 10 reps - Seated Knee Extension with Resistance  - 1 x daily - 3-4 x weekly - 2 sets - 10 reps - Seated March  - 1 x daily - 3-4 x weekly - 2 sets - 10 reps - Seated Heel Raise  - 1 x daily - 7 x weekly - 2 sets - 10 reps  ASSESSMENT:  CLINICAL IMPRESSION: Pt had lumbar injections recently and reports much improved back pain.  He denies lumbar pain currently.  PT limited intensity of exercises and session duration due to recent lumbar injection.  Pt performed exercises well without c/o's.  He continues to have decreased DF AROM R > L.  Pt is improving with DF AROM in S/L'ing.  Pt required UE support with step ups on airex.  He occasionally used UE support with LOB on L LE and did require some assist  from PT to correct LOB.  Pt is wearing flat Nike shoes currently and reports improved stability with those new shoes.  Pt responded well to Rx reporting no increased pain and having no c/o's after Rx.  Pt should benefit from cont skilled PT services to address ongoing goals and improve overall function.        OBJECTIVE IMPAIRMENTS Abnormal gait, cardiopulmonary status limiting activity, decreased balance, decreased coordination, decreased knowledge of use of DME, decreased mobility, difficulty walking, decreased strength, decreased safety awareness, increased edema, and impaired UE functional use.   ACTIVITY LIMITATIONS standing, squatting, stairs, transfers, bed mobility, reach over head, and locomotion level  PARTICIPATION LIMITATIONS: shopping, community activity, and yard work  PERSONAL FACTORS Age, Behavior pattern,  Fitness, Past/current experiences, and Time since onset of injury/illness/exacerbation are also affecting patient's functional outcome.   REHAB POTENTIAL: Good  CLINICAL DECISION MAKING: Evolving/moderate complexity  EVALUATION COMPLEXITY: Moderate   GOALS: Goals reviewed with patient? Yes  SHORT TERM GOALS: Target date: 01/03/2022  Will be compliant with appropriate progressive HEP to include walking vs floor bike program  Baseline: Goal status: PROGRESSING  2.  Will be able to name 3 ways to reduce fall risk at home and in community  Baseline:  Goal status: PARTIALLY MET  3.  Will demonstrate improved ankle dorsiflexor endurance and reduce related fall risk as evidenced by elimination of foot slap during 3MWT  Baseline:  Goal status: NOT MET  4. Will be independent with alternative ways to control edema in BLEs (such as spiral wrapping with ACE bandages) Baseline:  Goal status: not met    LONG TERM GOALS: Target date: 02/07/2022  MMT to improve by at least 1 grade in all weak groups  Baseline:  Goal status: NOT MET  2.  Will be able to ambulate at  least 579f safely in 3MWT with use of SPC vs SBQC with no more than S level assist to improve community access  Baseline:  Goal status: performed with rollator but not met with cane  3.  Will score at least 37 on the [place] to show improved balance/reduced fall risk  Baseline:  Goal status:60% MET  4.  Will demonstrate improved UE strength as evidenced by ability to lift rollator in/out of car for improved community access (for extended gait distances 10043f+) Baseline:  Goal status: GOAL MET  5.  Will be compliant with advanced HEP vs gym program to maintain functional gains and prevent recurrence of impairments  Baseline:  Goal status: ongoing     PLAN: PT FREQUENCY: 2x/week  PT DURATION: 5 weeks  PLANNED INTERVENTIONS: Therapeutic exercises, Therapeutic activity, Neuromuscular re-education, Balance training, Gait training, Patient/Family education, Self Care, Joint mobilization, Stair training, DME instructions, Aquatic Therapy, Taping, Ionotophoresis 50m46ml Dexamethasone, Manual therapy, and Re-evaluation.  PLAN FOR NEXT SESSION: Cont to progress strength, fxnl endurance, and balance. Strengthen bilat hips, quad, andankle dorsiflexors, may need to consider AFOs in the future if gait/ankle strength does not improve significantly with PT.     RobSelinda MichaelsI PT, DPT 01/17/22 5:25 PM

## 2022-01-23 ENCOUNTER — Encounter (HOSPITAL_BASED_OUTPATIENT_CLINIC_OR_DEPARTMENT_OTHER): Payer: Self-pay | Admitting: Physical Therapy

## 2022-01-23 ENCOUNTER — Ambulatory Visit (HOSPITAL_BASED_OUTPATIENT_CLINIC_OR_DEPARTMENT_OTHER): Payer: Medicare Other | Admitting: Physical Therapy

## 2022-01-23 DIAGNOSIS — M5459 Other low back pain: Secondary | ICD-10-CM

## 2022-01-23 DIAGNOSIS — R2681 Unsteadiness on feet: Secondary | ICD-10-CM

## 2022-01-23 DIAGNOSIS — R296 Repeated falls: Secondary | ICD-10-CM

## 2022-01-23 DIAGNOSIS — M6281 Muscle weakness (generalized): Secondary | ICD-10-CM

## 2022-01-23 NOTE — Therapy (Signed)
OUTPATIENT PHYSICAL THERAPY TREATMENT NOTE         Patient Name: Charles Lloyd. MRN: [id number] DOB:Dec 06, 1940, 81 y.o., male Today's Date: 01/24/2022     PT End of Session - 01/23/22 1324       Visit Number 13    Number of Visits 20     Date for PT Re-Evaluation 02/07/22     Authorization Type [employer] MCR     Progress Note Due on Visit 20     PT Start Time 1306    PT Stop Time 1347     PT Time Calculation (min) 41 min     Activity Tolerance Patient tolerated treatment well     Behavior During Therapy WFL for tasks assessed/performed           Past Medical History:  Diagnosis Date   Acute tracheobronchitis 01/05/2019   B12 deficiency    Basal cell carcinoma (BCC) of left side of nose 01/28/2018   Cancer ([place])    Cardiomyopathy ([place]) 03/10/2018   With chronic atrial fibrillation   CHF (congestive heart failure) ([place]) 04/16/2013   Functional class II, ejection fraction 35-40%  Formatting of this note might be different from the original. Functional class II, ejection fraction 35-40%   Chronic anticoagulation    Chronic diastolic (congestive) heart failure ([place]) 04/16/2013   Functional class II, ejection fraction 35-40%  Last Assessment & Plan:  Clinically stable Volume well controlled meds reviewed   Chronic diastolic CHF (congestive heart failure) ([place]) 04/16/2013   Functional class II, ejection fraction 35-40%  Last Assessment & Plan:  Clinically stable Volume well controlled meds reviewed   Colon polyps 04/16/2013   Diabetic neuropathy (HCC)    Eosinophil count raised 02/17/2019   Essential hypertension 01/06/2010   Last Assessment & Plan:  Well controlled Continue med management   Gout    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  Steroid usage was discontinued several months ago.  Follow up with Dermatology as planned.   Hypercholesterolemia 01/06/2010   Last Assessment & Plan:  Repeat labs  recommended   Hyperlipidemia associated with type 2 diabetes mellitus ([place]) 01/28/2018   Hypertensive heart disease with heart failure ([place]) 01/06/2010   Last Assessment & Plan:  Well controlled Continue med management   Hyponatremia 12/17/2018   Infected prosthetic knee joint ([place]) 06/24/2017   Last Assessment & Plan:  Id following ?ongoing abx managementy reported Request records   Infection of prosthetic right knee joint ([place])    Insomnia 03/04/2012   Grief with loss of wife 02/20/12  Last Assessment & Plan:  Improved sx  contineu xanax prn Se discussed   Lesion of liver 04/20/2019   -On cardiac CT 02/2019   Mild reactive airways disease    Neoplasm of prostate 04/16/2013   Neoplasm of prostate, malignant ([place]) 01/06/2010   [name] S/p radiation therapy   Last Assessment & Plan:  Repeat psa today No urinary sx Pt reported fatigue/poor appetite   On amiodarone therapy 09/07/2013   On continuous oral anticoagulation 01/28/2018   Other activity(E029.9) 04/20/2013   Formatting of this note might be different from the original. Transthoracic Echocardiogram-03/10/2013-[facility]: Normal left ventricular wall thickness and cavity size.  Global left ventricular systolic function is moderately reduced.  The estimated ejection fraction is 35-40%.  The left atrium is moderately enlarged.  The right atrium is mildly enlarged.  No significant valvular    Overweight (  BMI 25.0-29.9) 10/22/2016   Persistent atrial fibrillation ([place]) 04/16/2013   Last Assessment & Plan:  Rate controlled Continue med management eliquis for stroke preventino  Formatting of this note might be different from the original.  Drug  HX Current Rx Pre-ABL inefficacy Pre-ABL intolerant Post-ABL inefficacy Post-ABL intolerant max dose/24h M/Y end comments  sotalol                  dofetilide                  flecainide                  propafenone                  am   S/P TKR (total knee replacement), bilateral     Secondary hypercoagulable state ([place]) 04/09/2019   Tinea cruris 04/16/2013   Type 2 diabetes mellitus with diabetic polyneuropathy, without long-term current use of insulin ([place]) 04/16/2013   Last Assessment & Plan:  Labs today Pt reports well controlled on ambulatory monitoring   Vitamin D deficiency 07/25/2018   Past Surgical History:  Procedure Laterality Date   ATRIAL FIBRILLATION ABLATION N/A 03/12/2019   Procedure: ATRIAL FIBRILLATION ABLATION;  Surgeon: [name], MD;  Location: [place] [facility];  Service: Cardiovascular;  Laterality: N/A;   ATRIAL FIBRILLATION ABLATION N/A 04/29/2020   Procedure: ATRIAL FIBRILLATION ABLATION;  Surgeon: [name], MD;  Location: [place] [facility];  Service: Cardiovascular;  Laterality: N/A;   CARDIAC ELECTROPHYSIOLOGY STUDY AND ABLATION     CARDIOVERSION     CARDIOVERSION N/A 10/10/2018   Procedure: CARDIOVERSION;  Surgeon: [name], MD;  Location: [facility] ENDOSCOPY;  Service: Cardiovascular;  Laterality: N/A;   CARDIOVERSION N/A 05/18/2020   Procedure: CARDIOVERSION;  Surgeon: [name], MD;  Location[facility] ENDOSCOPY;  Service: Cardiovascular;  Laterality: N/A;   CARDIOVERSION N/A 12/09/2020   Procedure: CARDIOVERSION;  Surgeon: Lelon Perla, MD;  Location: [facility] ENDOSCOPY;  Service: Cardiovascular;  Laterality: N/A;   CHOLECYSTECTOMY     PROSTATECTOMY     REPLACEMENT TOTAL KNEE BILATERAL     Patient Active Problem List   Diagnosis Date Noted   Acute renal failure superimposed on stage 3b chronic kidney disease ([place]) 11/16/2021   Uremia 11/16/2021   Gout due to renal impairment 10/17/2021   Pancytopenia ([place]) 08/05/2021   SIRS (systemic inflammatory response syndrome) ([place]) 08/04/2021   Chronic kidney disease, stage 3b ([place]) 12/30/2020   Multiple falls    Spinal stenosis    S/P TKR (total knee replacement), bilateral    Arthralgia 11/20/2019   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   Lesion of liver 04/20/2019    Secondary hypercoagulable state ([place]) 04/09/2019   Eosinophil count raised 02/17/2019   Acute tracheobronchitis 01/05/2019   Asthma    Vitamin D deficiency 07/25/2018   Chronic anticoagulation 03/06/2018   Hyperlipidemia associated with type 2 diabetes mellitus ([place]) 01/28/2018   Basal cell carcinoma (BCC) of left side of nose 01/28/2018   Infected prosthetic knee joint ([place]) 06/24/2017   B12 deficiency 10/22/2016   Overweight (BMI 25.0-29.9) 10/22/2016   Primary osteoarthritis of right knee 03/29/2015   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   Paroxysmal A-fib ([place]) 04/16/2013   Chronic diastolic (congestive) heart failure ([place]) 04/16/2013   Colon polyps 04/16/2013   NIDDM-2 with polyneuropathy and hyperglycemia 04/16/2013   Tinea cruris 04/16/2013   Diabetic neuropathy ([place]) 07/14/2012   Insomnia 03/04/2012   Hypertensive heart disease  with heart failure ([place]) 01/06/2010   Neoplasm of prostate, malignant ([place]) 01/06/2010   Hypercholesterolemia 01/06/2010   Essential hypertension 01/06/2010    PCP: Isaac Bliss, Olam Idler   REFERRING PROVIDER: Reece Agar, MD   REFERRING DIAG: [phone] (ICD-10-CM) - Spinal stenosis, lumbar region with neurogenic claudication   Rationale for Evaluation and Treatment Rehabilitation  THERAPY DIAG:  Unsteadiness on feet  Repeated falls  Muscle weakness (generalized)  Other low back pain  ONSET DATE: 11/15/2021   SUBJECTIVE:                                                                                                                                                                                           SUBJECTIVE STATEMENT: Pt denies having any falls.  Pt states he had a couple of occasions of feeling being off balanced, but didn't fall.  He denies any adverse effects after prior Rx.  He reports compliance with HEP.  Pt reports improved LE strength.  Pt reports he feels more stable at home with socks on than the cushion shoes.  Pt ambulates  without an AD at home.  Pt has difficulty with ascending stairs but feels good with descending stairs. Pt had gout flare up the Monday before last and saw MD.  He is doing better now.  Pt reports his back is feeling good since the lumbar injection.  Pt denies any lumbar pain.     PERTINENT HISTORY:  Lumbar anterolisthesis, Peripheral neuropathy, TKR bilat, A-fib, CHF, DM  PAIN:  Are you having pain? Yes: NPRS scale: Current:  0/10, Best:  1/10, Worst:  6/10 Pain location: central lumbar  Pain description: dull, achy Aggravating factors: none  Relieving factors: injections   FOTO: INITIAL/CURRENT:  59/63 ; Goal of 64 at visit #10    PRECAUTIONS: Fall and Other: severe neuropathy   WEIGHT BEARING RESTRICTIONS No  FALLS:  Has patient fallen in last 6 months? Yes. Number of falls 3.5 (caught himself on one fall, on the other 3 falls my legs just gave way like someone took my legs out from under me)  LIVING ENVIRONMENT: Lives with: lives alone and family is near by and can help him easily  Lives in: House/apartment Stairs:  two story townhouse with stair lift inside, no STE but does have to deal with one step going up to chair lift Has following equipment at home: Single point cane, Walker - 4 wheeled, and stair lift, raised toilet seats, shower stool   OCCUPATION: retired   PLOF: Independent, Independent with basic ADLs, and Requires assistive device for independence  PATIENT GOALS stop falling, try to gain some  strength   OBJECTIVE:   DIAGNOSTIC FINDINGS:  IMPRESSION: 1. 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 2. Stable to slight progression of severe spinal stenosis and moderate neural foraminal stenosis at L2-3. 3. Slight progression of moderate spinal stenosis at L3-4.    TODAY'S TREATMENT   Therapeutic Exercise: Pt performed: Nustep L4 x 5 mins with bilat UE/Les Standing heel raises 2x10  Sidestepping with Ue's on rail x 2 laps  Standing hip abd with  TrA Seated BAPS f/b approx 30 reps  Ankle DF with T band with YTB 2x12 on L Ankle Eversion with YTB 2x10 on R   R ankle DF in L S/L'ing x30 reps     Neuro Re-ed Activities:            Standing with FT without UE support 2x30 sec on airex            Marching on airex 3x15 reps with UE support Tandem stance with UE support and SBA 2x20-25 sec Standing in staggered stance x20 sec without UE support except for 1 LOB     Step ups on airex x 12-15 reps bilat with UE support on rail   Lateral step ups on airex with UE support x 10 bilat   Alt toe taps on 6 inch step with occasional UE assist and min assist    PATIENT EDUCATION:  Education details:  exercise form, exercise rationale, and POC.  Person educated: Patient Education method: Explanation, demonstration, verbal and tactile cues Education comprehension: verbalized understanding, returned demonstration, and verbal and visual cues required   HOME EXERCISE PROGRAM: Access Code: [email or web address] URL: [email or web address] Date: 12/01/2021 Prepared by: Ronny Flurry  Exercises - Supine Transversus Abdominis Bracing - Hands on Stomach  - 2 x daily - 7 x weekly - 2 sets - 10 reps - Supine March  - 1-2 x daily - 7 x weekly - 2 sets - 5-10 reps - Seated Hip Abduction with Resistance  - 1 x daily - 4 x weekly - 2 sets - 10 reps - Seated Knee Extension with Resistance  - 1 x daily - 3-4 x weekly - 2 sets - 10 reps - Seated March  - 1 x daily - 3-4 x weekly - 2 sets - 10 reps - Seated Heel Raise  - 1 x daily - 7 x weekly - 2 sets - 10 reps  ASSESSMENT:  CLINICAL IMPRESSION: Pt is improving with balance and stability and has had no falls since starting PT.  Pt performed exercises well without c/o's.  He has difficulty with alt toe tapping and requires assistance for balance.  He continues to have decreased DF AROM R > L.  Pt responded well to Rx reporting no increased pain and having no c/o's after Rx.  Pt should benefit from cont  skilled PT services to address ongoing goals and improve overall function.        OBJECTIVE IMPAIRMENTS Abnormal gait, cardiopulmonary status limiting activity, decreased balance, decreased coordination, decreased knowledge of use of DME, decreased mobility, difficulty walking, decreased strength, decreased safety awareness, increased edema, and impaired UE functional use.   ACTIVITY LIMITATIONS standing, squatting, stairs, transfers, bed mobility, reach over head, and locomotion level  PARTICIPATION LIMITATIONS: shopping, community activity, and yard work  PERSONAL FACTORS Age, Behavior pattern, Fitness, Past/current experiences, and Time since onset of injury/illness/exacerbation are also affecting patient's functional outcome.   REHAB POTENTIAL: Good  CLINICAL DECISION MAKING: Evolving/moderate complexity  EVALUATION COMPLEXITY: Moderate   GOALS: Goals  reviewed with patient? Yes  SHORT TERM GOALS: Target date: 01/03/2022  Will be compliant with appropriate progressive HEP to include walking vs floor bike program  Baseline: Goal status: PROGRESSING  2.  Will be able to name 3 ways to reduce fall risk at home and in community  Baseline:  Goal status: PARTIALLY MET  3.  Will demonstrate improved ankle dorsiflexor endurance and reduce related fall risk as evidenced by elimination of foot slap during 3MWT  Baseline:  Goal status: NOT MET  4. Will be independent with alternative ways to control edema in BLEs (such as spiral wrapping with ACE bandages) Baseline:  Goal status: not met    LONG TERM GOALS: Target date: 02/07/2022  MMT to improve by at least 1 grade in all weak groups  Baseline:  Goal status: NOT MET  2.  Will be able to ambulate at least 569f safely in 3MWT with use of SPC vs SBQC with no more than S level assist to improve community access  Baseline:  Goal status: performed with rollator but not met with cane  3.  Will score at least 37 on the [place] to  show improved balance/reduced fall risk  Baseline:  Goal status:60% MET  4.  Will demonstrate improved UE strength as evidenced by ability to lift rollator in/out of car for improved community access (for extended gait distances [id number]+) Baseline:  Goal status: GOAL MET  5.  Will be compliant with advanced HEP vs gym program to maintain functional gains and prevent recurrence of impairments  Baseline:  Goal status: ongoing     PLAN: PT FREQUENCY: 2x/week  PT DURATION: 5 weeks  PLANNED INTERVENTIONS: Therapeutic exercises, Therapeutic activity, Neuromuscular re-education, Balance training, Gait training, Patient/Family education, Self Care, Joint mobilization, Stair training, DME instructions, Aquatic Therapy, Taping, Ionotophoresis 71m32ml Dexamethasone, Manual therapy, and Re-evaluation.  PLAN FOR NEXT SESSION: Cont to progress strength, fxnl endurance, and balance. Strengthen bilat hips, quad, andankle dorsiflexors, may need to consider AFOs in the future if gait/ankle strength does not improve significantly with PT.     RobSelinda MichaelsI PT, DPT 01/24/22 10:00 PM

## 2022-01-24 ENCOUNTER — Telehealth: Payer: Self-pay | Admitting: Internal Medicine

## 2022-01-24 NOTE — Telephone Encounter (Signed)
Patient states he has symptoms of a head cold, congestion, cough and would like a prescription to treat. Offered OV, declined

## 2022-01-24 NOTE — Telephone Encounter (Signed)
Spoke with patient and explained that he will need an office visit for a prescription.  Patient will try OTC and call back as needed.

## 2022-01-25 ENCOUNTER — Ambulatory Visit (HOSPITAL_BASED_OUTPATIENT_CLINIC_OR_DEPARTMENT_OTHER): Payer: Medicare Other | Admitting: Physical Therapy

## 2022-01-25 ENCOUNTER — Encounter (HOSPITAL_BASED_OUTPATIENT_CLINIC_OR_DEPARTMENT_OTHER): Payer: Self-pay | Admitting: Physical Therapy

## 2022-01-25 ENCOUNTER — Encounter (HOSPITAL_BASED_OUTPATIENT_CLINIC_OR_DEPARTMENT_OTHER): Payer: Medicare Other | Admitting: Physical Therapy

## 2022-01-25 DIAGNOSIS — R2681 Unsteadiness on feet: Secondary | ICD-10-CM

## 2022-01-25 DIAGNOSIS — M5459 Other low back pain: Secondary | ICD-10-CM

## 2022-01-25 DIAGNOSIS — R296 Repeated falls: Secondary | ICD-10-CM

## 2022-01-25 DIAGNOSIS — M6281 Muscle weakness (generalized): Secondary | ICD-10-CM

## 2022-01-25 NOTE — Therapy (Signed)
OUTPATIENT PHYSICAL THERAPY TREATMENT NOTE         Patient Name: Charles Lloyd. MRN: [id number] DOB:02-06-1941, 81 y.o., male Today's Date: 01/26/2022   PT End of Session - 01/25/22 1155     Visit Number 14    Number of Visits 20    Date for PT Re-Evaluation 02/07/22    Authorization Type [employer] MCR    PT Start Time 5993    PT Stop Time 1234    PT Time Calculation (min) 40 min    Activity Tolerance Patient tolerated treatment well    Behavior During Therapy WFL for tasks assessed/performed                Past Medical History:  Diagnosis Date   Acute tracheobronchitis 01/05/2019   B12 deficiency    Basal cell carcinoma (BCC) of left side of nose 01/28/2018   Cancer ([place])    Cardiomyopathy ([place]) 03/10/2018   With chronic atrial fibrillation   CHF (congestive heart failure) ([place]) 04/16/2013   Functional class II, ejection fraction 35-40%  Formatting of this note might be different from the original. Functional class II, ejection fraction 35-40%   Chronic anticoagulation    Chronic diastolic (congestive) heart failure ([place]) 04/16/2013   Functional class II, ejection fraction 35-40%  Last Assessment & Plan:  Clinically stable Volume well controlled meds reviewed   Chronic diastolic CHF (congestive heart failure) ([place]) 04/16/2013   Functional class II, ejection fraction 35-40%  Last Assessment & Plan:  Clinically stable Volume well controlled meds reviewed   Colon polyps 04/16/2013   Diabetic neuropathy (HCC)    Eosinophil count raised 02/17/2019   Essential hypertension 01/06/2010   Last Assessment & Plan:  Well controlled Continue med management   Gout    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  Steroid usage was discontinued several months ago.  Follow up with Dermatology as planned.   Hypercholesterolemia 01/06/2010   Last Assessment & Plan:  Repeat labs recommended   Hyperlipidemia associated with  type 2 diabetes mellitus ([place]) 01/28/2018   Hypertensive heart disease with heart failure ([place]) 01/06/2010   Last Assessment & Plan:  Well controlled Continue med management   Hyponatremia 12/17/2018   Infected prosthetic knee joint ([place]) 06/24/2017   Last Assessment & Plan:  Id following ?ongoing abx managementy reported Request records   Infection of prosthetic right knee joint ([place])    Insomnia 03/04/2012   Grief with loss of wife 02/20/12  Last Assessment & Plan:  Improved sx  contineu xanax prn Se discussed   Lesion of liver 04/20/2019   -On cardiac CT 02/2019   Mild reactive airways disease    Neoplasm of prostate 04/16/2013   Neoplasm of prostate, malignant ([place]) 01/06/2010   [name] S/p radiation therapy   Last Assessment & Plan:  Repeat psa today No urinary sx Pt reported fatigue/poor appetite   On amiodarone therapy 09/07/2013   On continuous oral anticoagulation 01/28/2018   Other activity(E029.9) 04/20/2013   Formatting of this note might be different from the original. Transthoracic Echocardiogram-03/10/2013-[facility]: Normal left ventricular wall thickness and cavity size.  Global left ventricular systolic function is moderately reduced.  The estimated ejection fraction is 35-40%.  The left atrium is moderately enlarged.  The right atrium is mildly enlarged.  No significant valvular    Overweight (BMI 25.0-29.9) 10/22/2016   Persistent atrial fibrillation ([place]) 04/16/2013   Last Assessment &  Plan:  Rate controlled Continue med management eliquis for stroke preventino  Formatting of this note might be different from the original.  Drug  HX Current Rx Pre-ABL inefficacy Pre-ABL intolerant Post-ABL inefficacy Post-ABL intolerant max dose/24h M/Y end comments  sotalol                  dofetilide                  flecainide                  propafenone                  am   S/P TKR (total knee replacement), bilateral    Secondary hypercoagulable state ([place]) 04/09/2019    Tinea cruris 04/16/2013   Type 2 diabetes mellitus with diabetic polyneuropathy, without long-term current use of insulin ([place]) 04/16/2013   Last Assessment & Plan:  Labs today Pt reports well controlled on ambulatory monitoring   Vitamin D deficiency 07/25/2018   Past Surgical History:  Procedure Laterality Date   ATRIAL FIBRILLATION ABLATION N/A 03/12/2019   Procedure: ATRIAL FIBRILLATION ABLATION;  Surgeon: [name], MD;  Location: [facility];  Service: Cardiovascular;  Laterality: N/A;   ATRIAL FIBRILLATION ABLATION N/A 04/29/2020   Procedure: ATRIAL FIBRILLATION ABLATION;  Surgeon: [name], MD;  Location: [facility];  Service: Cardiovascular;  Laterality: N/A;   CARDIAC ELECTROPHYSIOLOGY STUDY AND ABLATION     CARDIOVERSION     CARDIOVERSION N/A 10/10/2018   Procedure: CARDIOVERSION;  Surgeon: [name], MD;  Location: [facility];  Service: Cardiovascular;  Laterality: N/A;   CARDIOVERSION N/A 05/18/2020   Procedure: CARDIOVERSION;  Surgeon: [name], MD;  Location: [facility];  Service: Cardiovascular;  Laterality: N/A;   CARDIOVERSION N/A 12/09/2020   Procedure: CARDIOVERSION;  Surgeon: Lelon Perla, MD;  Location: [facility];  Service: Cardiovascular;  Laterality: N/A;   CHOLECYSTECTOMY     PROSTATECTOMY     REPLACEMENT TOTAL KNEE BILATERAL     Patient Active Problem List   Diagnosis Date Noted   Acute renal failure superimposed on stage 3b chronic kidney disease ([place]) 11/16/2021   Uremia 11/16/2021   Gout due to renal impairment 10/17/2021   Pancytopenia ([place]) 08/05/2021   SIRS (systemic inflammatory response syndrome) ([place]) 08/04/2021   Chronic kidney disease, stage 3b ([place]) 12/30/2020   Multiple falls    Spinal stenosis    S/P TKR (total knee replacement), bilateral    Arthralgia 11/20/2019   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   Lesion of liver 04/20/2019   Secondary hypercoagulable state ([place]) 04/09/2019    Eosinophil count raised 02/17/2019   Acute tracheobronchitis 01/05/2019   Asthma    Vitamin D deficiency 07/25/2018   Chronic anticoagulation 03/06/2018   Hyperlipidemia associated with type 2 diabetes mellitus ([place]) 01/28/2018   Basal cell carcinoma (BCC) of left side of nose 01/28/2018   Infected prosthetic knee joint ([place]) 06/24/2017   B12 deficiency 10/22/2016   Overweight (BMI 25.0-29.9) 10/22/2016   Primary osteoarthritis of right knee 03/29/2015   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   Paroxysmal A-fib ([place]) 04/16/2013   Chronic diastolic (congestive) heart failure ([place]) 04/16/2013   Colon polyps 04/16/2013   NIDDM-2 with polyneuropathy and hyperglycemia 04/16/2013   Tinea cruris 04/16/2013   Diabetic neuropathy ([place]) 07/14/2012   Insomnia 03/04/2012   Hypertensive heart disease with heart failure ([place]) 01/06/2010   Neoplasm of prostate, malignant ([place]) 01/06/2010  Hypercholesterolemia 01/06/2010   Essential hypertension 01/06/2010    PCP: Isaac Bliss, [facility] Idler   REFERRING PROVIDER: Reece Agar, MD   REFERRING DIAG: [id number] (ICD-10-CM) - Spinal stenosis, lumbar region with neurogenic claudication   Rationale for Evaluation and Treatment Rehabilitation  THERAPY DIAG:  Unsteadiness on feet  Repeated falls  Muscle weakness (generalized)  Other low back pain  ONSET DATE: 11/15/2021   SUBJECTIVE:                                                                                                                                                                                           SUBJECTIVE STATEMENT: Pt denies having any falls.  Pt states he had a couple of occasions of feeling being off balanced, but didn't fall.  He denies any adverse effects after prior Rx.  He reports compliance with HEP.  Pt reports improved LE strength.  Pt ambulates without an AD at home.  Pt has difficulty with ascending stairs but feels good with descending stairs.  Pt wearing his [employer] shoes  today and reports feeling more stable in those shoes.   Pt reports his back is feeling good since the lumbar injection.  Pt denies any lumbar pain.     PERTINENT HISTORY:  Lumbar anterolisthesis, Peripheral neuropathy, TKR bilat, A-fib, CHF, DM  PAIN:  Are you having pain? Yes: NPRS scale: Current:  0/10, Best:  1/10, Worst:  6/10 Pain location: central lumbar  Pain description: dull, achy Aggravating factors: none  Relieving factors: injections   FOTO: INITIAL/CURRENT:  59/63 ; Goal of 64 at visit #10    PRECAUTIONS: Fall and Other: severe neuropathy   WEIGHT BEARING RESTRICTIONS No  FALLS:  Has patient fallen in last 6 months? Yes. Number of falls 3.5 (caught himself on one fall, on the other 3 falls my legs just gave way like someone took my legs out from under me)  LIVING ENVIRONMENT: Lives with: lives alone and family is near by and can help him easily  Lives in: House/apartment Stairs:  two story townhouse with stair lift inside, no STE but does have to deal with one step going up to chair lift Has following equipment at home: Single point cane, Walker - 4 wheeled, and stair lift, raised toilet seats, shower stool   OCCUPATION: retired   PLOF: Independent, Independent with basic ADLs, and Requires assistive device for independence  PATIENT GOALS stop falling, try to gain some strength   OBJECTIVE:   DIAGNOSTIC FINDINGS:  IMPRESSION: 1. 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 2. Stable to slight progression of severe spinal stenosis and moderate neural foraminal stenosis at  L2-3. 3. Slight progression of moderate spinal stenosis at L3-4.    TODAY'S TREATMENT   Therapeutic Exercise: Pt performed: Nustep L4 x 5 mins with bilat UE/Les Standing heel raises 2x10  Sidestepping with Ue's on rail x 3 laps  Standing hip abd with TrA x10 reps without resistance and x10 reps with YTB  Ankle DF with T band with RTB 2x12 on L Ankle Eversion with YTB 2x10 on  R   R ankle DF in L S/L'ing x30 reps     Neuro Re-ed Activities:            Standing with FT without UE support 2x30 sec on airex            Marching on airex 2x15 reps with UE support Tandem stance with UE support and SBA 2x30 sec with CGA Standing in staggered stance on airex x20-25 sec with 4-5 occasions of UE support with SBA/CGA    Step ups on airex x 15 reps bilat with UE support on rail   Lateral step ups on airex with UE support x 15 bilat   Alt toe taps on 6 inch step with occasional to frequent UE assist and min assist 2x10 reps    PATIENT EDUCATION:  Education details:  exercise form, exercise rationale, and POC.  Person educated: Patient Education method: Explanation, demonstration, verbal and tactile cues Education comprehension: verbalized understanding, returned demonstration, and verbal and visual cues required   HOME EXERCISE PROGRAM: Access Code: [email or web address] URL: [email or web address] Date: 12/01/2021 Prepared by: Ronny Flurry  Exercises - Supine Transversus Abdominis Bracing - Hands on Stomach  - 2 x daily - 7 x weekly - 2 sets - 10 reps - Supine March  - 1-2 x daily - 7 x weekly - 2 sets - 5-10 reps - Seated Hip Abduction with Resistance  - 1 x daily - 4 x weekly - 2 sets - 10 reps - Seated Knee Extension with Resistance  - 1 x daily - 3-4 x weekly - 2 sets - 10 reps - Seated March  - 1 x daily - 3-4 x weekly - 2 sets - 10 reps - Seated Heel Raise  - 1 x daily - 7 x weekly - 2 sets - 10 reps  ASSESSMENT:  CLINICAL IMPRESSION: Pt is improving with balance and stability.  He has difficulty with alt toe tapping and requires assistance for balance.  PT added resistance to standing hip abd.  Pt continues to have weakness in bilat DF and limited AROM in DF R>L.  PT increased resistance with L ankle DF to improve strength.  Pt performed exercises well without c/o's.  He was fatigued with standing exercises.  Pt responded well to Rx reporting no  increased pain and having no c/o's after Rx.  Pt should benefit from cont skilled PT services to address ongoing goals and improve overall function.          OBJECTIVE IMPAIRMENTS Abnormal gait, cardiopulmonary status limiting activity, decreased balance, decreased coordination, decreased knowledge of use of DME, decreased mobility, difficulty walking, decreased strength, decreased safety awareness, increased edema, and impaired UE functional use.   ACTIVITY LIMITATIONS standing, squatting, stairs, transfers, bed mobility, reach over head, and locomotion level  PARTICIPATION LIMITATIONS: shopping, community activity, and yard work  PERSONAL FACTORS Age, Behavior pattern, Fitness, Past/current experiences, and Time since onset of injury/illness/exacerbation are also affecting patient's functional outcome.   REHAB POTENTIAL: Good  CLINICAL DECISION MAKING: Evolving/moderate complexity  EVALUATION COMPLEXITY: Moderate  GOALS: Goals reviewed with patient? Yes  SHORT TERM GOALS: Target date: 01/03/2022  Will be compliant with appropriate progressive HEP to include walking vs floor bike program  Baseline: Goal status: PROGRESSING  2.  Will be able to name 3 ways to reduce fall risk at home and in community  Baseline:  Goal status: PARTIALLY MET  3.  Will demonstrate improved ankle dorsiflexor endurance and reduce related fall risk as evidenced by elimination of foot slap during 3MWT  Baseline:  Goal status: NOT MET  4. Will be independent with alternative ways to control edema in BLEs (such as spiral wrapping with ACE bandages) Baseline:  Goal status: not met    LONG TERM GOALS: Target date: 02/07/2022  MMT to improve by at least 1 grade in all weak groups  Baseline:  Goal status: NOT MET  2.  Will be able to ambulate at least 544f safely in 3MWT with use of SPC vs SBQC with no more than S level assist to improve community access  Baseline:  Goal status: performed with  rollator but not met with cane  3.  Will score at least 37 on the [place] to show improved balance/reduced fall risk  Baseline:  Goal status:60% MET  4.  Will demonstrate improved UE strength as evidenced by ability to lift rollator in/out of car for improved community access (for extended gait distances 10041f+) Baseline:  Goal status: GOAL MET  5.  Will be compliant with advanced HEP vs gym program to maintain functional gains and prevent recurrence of impairments  Baseline:  Goal status: ongoing     PLAN: PT FREQUENCY: 2x/week  PT DURATION: 5 weeks  PLANNED INTERVENTIONS: Therapeutic exercises, Therapeutic activity, Neuromuscular re-education, Balance training, Gait training, Patient/Family education, Self Care, Joint mobilization, Stair training, DME instructions, Aquatic Therapy, Taping, Ionotophoresis 22m41ml Dexamethasone, Manual therapy, and Re-evaluation.  PLAN FOR NEXT SESSION: Cont to progress strength, fxnl endurance, and balance. Strengthen bilat hips, quad, andankle dorsiflexors, may need to consider AFOs in the future if gait/ankle strength does not improve significantly with PT.     RobSelinda MichaelsI PT, DPT 01/26/22 8:39 AM

## 2022-01-26 ENCOUNTER — Telehealth: Payer: Self-pay | Admitting: Internal Medicine

## 2022-01-26 ENCOUNTER — Encounter (HOSPITAL_BASED_OUTPATIENT_CLINIC_OR_DEPARTMENT_OTHER): Payer: Self-pay | Admitting: Emergency Medicine

## 2022-01-26 ENCOUNTER — Emergency Department (HOSPITAL_BASED_OUTPATIENT_CLINIC_OR_DEPARTMENT_OTHER): Payer: Medicare Other | Admitting: Radiology

## 2022-01-26 ENCOUNTER — Other Ambulatory Visit: Payer: Self-pay

## 2022-01-26 ENCOUNTER — Emergency Department (HOSPITAL_BASED_OUTPATIENT_CLINIC_OR_DEPARTMENT_OTHER)
Admission: EM | Admit: 2022-01-26 | Discharge: 2022-01-26 | Disposition: A | Discharge status: Home / Self Care | Payer: Medicare Other | Attending: Emergency Medicine | Admitting: Emergency Medicine

## 2022-01-26 DIAGNOSIS — I10 Essential (primary) hypertension: Secondary | ICD-10-CM | POA: Diagnosis not present

## 2022-01-26 DIAGNOSIS — U071 COVID-19: Secondary | ICD-10-CM | POA: Insufficient documentation

## 2022-01-26 DIAGNOSIS — I509 Heart failure, unspecified: Secondary | ICD-10-CM | POA: Diagnosis not present

## 2022-01-26 DIAGNOSIS — Z794 Long term (current) use of insulin: Secondary | ICD-10-CM | POA: Diagnosis not present

## 2022-01-26 DIAGNOSIS — Z79899 Other long term (current) drug therapy: Secondary | ICD-10-CM | POA: Diagnosis not present

## 2022-01-26 DIAGNOSIS — R519 Headache, unspecified: Secondary | ICD-10-CM | POA: Diagnosis present

## 2022-01-26 DIAGNOSIS — D509 Iron deficiency anemia, unspecified: Secondary | ICD-10-CM | POA: Diagnosis not present

## 2022-01-26 LAB — CBC WITH DIFFERENTIAL/PLATELET
Abs Immature Granulocytes: 0.06 10*3/uL (ref 0.00–0.07)
Basophils Absolute: 0 10*3/uL (ref 0.0–0.1)
Basophils Relative: 0 %
Eosinophils Absolute: 0 10*3/uL (ref 0.0–0.5)
Eosinophils Relative: 0 %
HCT: 37.9 % — ABNORMAL LOW (ref 39.0–52.0)
Hemoglobin: 12.2 g/dL — ABNORMAL LOW (ref 13.0–17.0)
Immature Granulocytes: 1 %
Lymphocytes Relative: 8 %
Lymphs Abs: 0.6 10*3/uL — ABNORMAL LOW (ref 0.7–4.0)
MCH: 31.5 pg (ref 26.0–34.0)
MCHC: 32.2 g/dL (ref 30.0–36.0)
MCV: 97.9 fL (ref 80.0–100.0)
Monocytes Absolute: 0.5 10*3/uL (ref 0.1–1.0)
Monocytes Relative: 6 %
Neutro Abs: 6.9 10*3/uL (ref 1.7–7.7)
Neutrophils Relative %: 85 %
Platelets: 173 10*3/uL (ref 150–400)
RBC: 3.87 MIL/uL — ABNORMAL LOW (ref 4.22–5.81)
RDW: 13.5 % (ref 11.5–15.5)
WBC: 8.1 10*3/uL (ref 4.0–10.5)
nRBC: 0 % (ref 0.0–0.2)

## 2022-01-26 LAB — COMPREHENSIVE METABOLIC PANEL
ALT: 27 U/L (ref 0–44)
AST: 26 U/L (ref 15–41)
Albumin: 4.1 g/dL (ref 3.5–5.0)
Alkaline Phosphatase: 97 U/L (ref 38–126)
Anion gap: 10 (ref 5–15)
BUN: 33 mg/dL — ABNORMAL HIGH (ref 8–23)
CO2: 25 mmol/L (ref 22–32)
Calcium: 8.9 mg/dL (ref 8.9–10.3)
Chloride: 103 mmol/L (ref 98–111)
Creatinine, Ser: 1.12 mg/dL (ref 0.61–1.24)
GFR, Estimated: >60 mL/min (ref >60)
Glucose, Bld: 171 mg/dL — ABNORMAL HIGH (ref 70–99)
Potassium: 4.3 mmol/L (ref 3.5–5.1)
Sodium: 138 mmol/L (ref 135–145)
Total Bilirubin: 0.9 mg/dL (ref 0.3–1.2)
Total Protein: 6.5 g/dL (ref 6.5–8.1)

## 2022-01-26 LAB — URINALYSIS, ROUTINE W REFLEX MICROSCOPIC
Bilirubin Urine: NEGATIVE
Glucose, UA: NEGATIVE mg/dL
Hgb urine dipstick: NEGATIVE
Ketones, ur: NEGATIVE mg/dL
Leukocytes,Ua: NEGATIVE
Nitrite: NEGATIVE
Protein, ur: NEGATIVE mg/dL
Specific Gravity, Urine: 1.009 (ref 1.005–1.030)
pH: 5 (ref 5.0–8.0)

## 2022-01-26 LAB — RESP PANEL BY RT-PCR (FLU A&B, COVID) ARPGX2
Influenza A by PCR: NEGATIVE
Influenza B by PCR: NEGATIVE
SARS Coronavirus 2 by RT PCR: POSITIVE — AB

## 2022-01-26 LAB — TROPONIN I (HIGH SENSITIVITY): Troponin I (High Sensitivity): 7 ng/L (ref <18)

## 2022-01-26 LAB — BRAIN NATRIURETIC PEPTIDE: B Natriuretic Peptide: 380.8 pg/mL — ABNORMAL HIGH (ref 0.0–100.0)

## 2022-01-26 NOTE — Discharge Instructions (Addendum)
Thank you for allowing me to be part of your care today.  You were seen in the ER for hypertension and cough.  You are positive for COVID.  I recommend isolating until Sunday and then mask wearing around others for 5 days.  Your chest x-ray showed no pneumonia and your work-up is reassuring.   Please follow-up with your primary care physician or cardiologist to discuss your elevated BP.  Do not alter your current medications without speaking to your doctor first.  Continue to monitor your blood pressure at home as you have been.    Return to the ER if you develop any new or worsening symptoms or have any new concerns.

## 2022-01-26 NOTE — Telephone Encounter (Signed)
Pt called to say he was just released from ED and was told to call MD and ask to have his BP medications adjusted.  Please call to clarify if Pt should come in to discuss adjustments.  Pt is returning Thursday or Friday from a trip and will call back then to schedule a HFU.

## 2022-01-26 NOTE — ED Triage Notes (Signed)
Pt reports productive cough, pulsing headache and hypertension. Pt reports taking all his prescribed medications but noticed BP was 180s yesterday.

## 2022-01-26 NOTE — ED Notes (Signed)
Pt stood at bedside to use urinal. Denies dizziness. Urine obtained and sent to lab

## 2022-01-26 NOTE — ED Notes (Signed)
Patient transported to X-ray 

## 2022-01-26 NOTE — ED Provider Notes (Signed)
[facility] EMERGENCY DEPT Provider Note   CSN: [id number] Arrival date & time: 01/26/22  1056     History  Chief Complaint  Patient presents with   Cough   Hypertension    Charles Lloyd. is a 81 y.o. male presents to the ER with concerns of elevated blood pressure with headache.  He states he has also had a cough since Tuesday, but feels that it is almost completely gone.  Patient monitors his BP at home and has had higher than normal readings.  [facility] and reports his systolic was in the 190s.  He states a couple of months ago, his medication was adjusted due to his BP being too low.  He is taking his medications as prescribed.  Patient only used Mucinex for his cough and no other cough/cold medicines.  Denies chest pain, chest tightness, shortness of breath, palpitations, fever, chills, weakness, syncope, lightheadedness, dizziness.  He does have extensive cardiac history and is followed by cardiology.       Home Medications Prior to Admission medications   Medication Sig Start Date End Date Taking? Authorizing Provider  ACCU-CHEK AVIVA PLUS test strip 1 EACH BY OTHER ROUTE DAILY. DX E11.9 08/28/21   Philip Aspen, Limmie Patricia, MD  albuterol (VENTOLIN HFA) 108 (90 Base) MCG/ACT inhaler Inhale 1-2 puffs into the lungs every 6 (six) hours as needed for wheezing or shortness of breath. 10/05/20   Parrett, Virgel Bouquet, NP  allopurinol (ZYLOPRIM) 300 MG tablet TAKE 1 TABLET BY MOUTH EVERY DAY 01/11/22   Philip Aspen, Limmie Patricia, MD  atorvastatin (LIPITOR) 20 MG tablet TAKE 1 TABLET DAILY 12/11/21   Philip Aspen, Limmie Patricia, MD  carvedilol (COREG) 3.125 MG tablet TAKE 1 TABLET BY MOUTH 2 TIMES DAILY. 12/12/21   Philip Aspen, Limmie Patricia, MD  colchicine 0.6 MG tablet Take 1 tablet (0.6 mg total) by mouth 2 (two) times daily. 01/08/22   Philip Aspen, Limmie Patricia, MD  Continuous Blood Gluc Receiver (FREESTYLE LIBRE 2 READER) DEVI 1 each by Does  not apply route daily. 08/28/21   Philip Aspen, Limmie Patricia, MD  Continuous Blood Gluc Sensor (FREESTYLE LIBRE 2 SENSOR) MISC 1 each by Does not apply route daily. 08/28/21   Philip Aspen, Limmie Patricia, MD  ELIQUIS 5 MG TABS tablet TAKE 1 TABLET TWICE A DAY Patient taking differently: Take 5 mg by mouth 2 (two) times daily. 10/02/21   Philip Aspen, Limmie Patricia, MD  flecainide (TAMBOCOR) 100 MG tablet TAKE 1 TABLET BY MOUTH TWICE A DAY 11/03/21   Camnitz, Will Daphine Deutscher, MD  fluticasone [facility]) 50 MCG/ACT nasal spray Place 2 sprays into both nostrils daily. 01/07/19   Berton Mount I, MD  fluticasone furoate-vilanterol (BREO ELLIPTA) 100-25 MCG/INH AEPB Inhale 1 puff into the lungs daily. 10/05/20   Parrett, Virgel Bouquet, NP  gabapentin (NEURONTIN) 300 MG capsule TAKE 2 CAPSULES BY MOUTH AT BEDTIME Patient taking differently: Take 600 mg by mouth at bedtime. 04/17/21   Philip Aspen, Limmie Patricia, MD  insulin glargine (LANTUS SOLOSTAR) 100 UNIT/ML Solostar Pen Inject 8 Units into the skin daily. 09/29/21   Kristian Covey, MD  KLOR-CON M20 20 MEQ tablet TAKE 1 TABLET BY MOUTH EVERY DAY 11/20/21   Philip Aspen, Limmie Patricia, MD  montelukast (SINGULAIR) 10 MG tablet Take 10 mg by mouth at bedtime.     [provider]  RESTASIS 0.05 % ophthalmic emulsion Place 1 drop into both eyes 2 (  two) times daily as needed (dry eyes). 12/09/19   [provider]  torsemide (DEMADEX) 20 MG tablet Take 1 tablet (20 mg total) by mouth daily. Start taking from Monday only 11/18/21   Burnadette Pop, MD      Allergies    Patient has no known allergies.    Review of Systems   Review of Systems  Constitutional:  Negative for chills and fever.  Respiratory:  Positive for cough. Negative for chest tightness and shortness of breath.   Cardiovascular:  Negative for chest pain and palpitations.  Neurological:  Positive for headaches. Negative for dizziness, syncope, weakness and light-headedness.    Physical  Exam Updated Vital Signs BP (!) 184/82   Pulse 66   Temp 97.9 F (36.6 C) (Oral)   Resp 14   Ht 5\' 10"  (1.778 m)   Wt 81 kg   SpO2 99%   BMI 25.62 kg/m  Physical Exam Vitals and nursing note reviewed.  Constitutional:      General: He is not in acute distress.    Appearance: He is not ill-appearing.  HENT:     Mouth/Throat:     Mouth: Mucous membranes are moist.     Pharynx: Oropharynx is clear.  Cardiovascular:     Rate and Rhythm: Normal rate. Rhythm irregular.     Pulses: Normal pulses.     Heart sounds: Normal heart sounds.     Comments: Patient is in ventricular bigeminy. Pulmonary:     Effort: Pulmonary effort is normal. No accessory muscle usage or respiratory distress.     Breath sounds: Normal breath sounds and air entry. No wheezing or rales.  Abdominal:     General: Abdomen is flat. Bowel sounds are normal. There is no distension.     Palpations: Abdomen is soft.     Tenderness: There is no abdominal tenderness.  Musculoskeletal:     Right lower leg: No edema.     Left lower leg: No edema.  Skin:    General: Skin is warm and dry.     Capillary Refill: Capillary refill takes less than 2 seconds.     Coloration: Skin is not pale.  Neurological:     Mental Status: He is alert. Mental status is at baseline.  Psychiatric:        Mood and Affect: Mood normal.        Behavior: Behavior normal.     ED Results / Procedures / Treatments   Labs (all labs ordered are listed, but only abnormal results are displayed) Labs Reviewed  RESP PANEL BY RT-PCR (FLU A&B, COVID) ARPGX2 - Abnormal; Notable for the following components:      Result Value   SARS Coronavirus 2 by RT PCR POSITIVE (*)    All other components within normal limits  CBC WITH DIFFERENTIAL/PLATELET - Abnormal; Notable for the following components:   RBC 3.87 (*)    Hemoglobin 12.2 (*)    HCT 37.9 (*)    Lymphs Abs 0.6 (*)    All other components within normal limits  URINALYSIS, ROUTINE W REFLEX  MICROSCOPIC - Abnormal; Notable for the following components:   Color, Urine COLORLESS (*)    All other components within normal limits  BRAIN NATRIURETIC PEPTIDE - Abnormal; Notable for the following components:   B Natriuretic Peptide 380.8 (*)    All other components within normal limits  COMPREHENSIVE METABOLIC PANEL - Abnormal; Notable for the following components:   Glucose, Bld 171 (*)  BUN 33 (*)    All other components within normal limits  TROPONIN I (HIGH SENSITIVITY)    EKG EKG Interpretation  Date/Time:  Friday January 26 2022 11:12:00 EST Ventricular Rate:  79 PR Interval:  141 QRS Duration: 145 QT Interval:  458 QTC Calculation: 426 R Axis:   121 Text Interpretation: Sinus or ectopic atrial rhythm Ventricular bigeminy RBBB and LPFB Confirmed by Pricilla Loveless [phone]) on 01/26/2022 11:15:34 AM  Radiology DG Chest 2 View  Result Date: 01/26/2022 CLINICAL DATA:  Productive cough. EXAM: CHEST - 2 VIEW COMPARISON:  11/16/2021 FINDINGS: The heart size and mediastinal contours are within normal limits. Aortic atherosclerotic calcification incidentally noted. Both lungs are clear. The visualized skeletal structures are unremarkable. IMPRESSION: No active cardiopulmonary disease. Electronically Signed   By: Danae Orleans M.D.   On: 01/26/2022 12:43    Procedures Procedures    Medications Ordered in ED Medications - No data to display  ED Course/ Medical Decision Making/ A&P                           Medical Decision Making Amount and/or Complexity of Data Reviewed Labs: ordered. Radiology: ordered.   This patient presents to the ED with chief complaint(s) of elevated blood pressure, headache, and recent upper respiratory infection type symptoms with pertinent past medical history of paroxysmal A-fib, CHF, type 2 diabetes, hypertension, CKD.The complaint involves an extensive differential diagnosis and also carries with it a high risk of complications and  morbidity.    The differential diagnosis includes hypertensive urgency, hypertensive emergency, ACS, pneumonia  The initial plan is to obtain cardiac labs, CXR, and ECG  Initial Assessment:   On exam, patient is resting comfortably in bed and is in no acute distress.  Heart rate is normal, irregular rhythm with ventricular bigeminy.  No murmurs appreciated on exam.  No bilateral lower extremity edema.  Lungs clear to auscultation bilaterally, no wheezing, rales, or decreased lung sounds.  No cough appreciated on exam.  Skin is warm and dry.   Patient had only been taking Mucinex for his cough.  He was not taking any other cold/flu medicines that may elevate his blood pressure and explain his current hypertensive readings.   Independent ECG/labs interpretation:  The following labs were independently interpreted:  CBC reveals normocytic anemia, Hgb 12.2, which is improved since a month ago; no leukocytosis or abnormal platelet counts.  Metabolic panel significant for elevated glucose at 171 and elevated BUN at 33. Cr is 1.12, which is much improved since 2 months ago.   COVID +, influenza negative UA reveals no evidence of glucosuria or proteinuria Troponin 7 BNP 380.8  I independently interpreted ECG which demonstrations ectopic atrial rhythm with ventricular bigeminy.  I independently interpreted cardiac monitor, patient was in ventricular bigeminy for initial assessment, but on reassessment appears to be in sinus rhythm.    Independent visualization and interpretation of imaging: I independently visualized the following imaging with scope of interpretation limited to determining acute life threatening conditions related to emergency care: CXR, which revealed no infiltrates suggestive of pneumonia, no pneumothorax, no pleural effusions; heart is of normal size.  I agree with radiologist interpretation.   Treatment and Reassessment: Patient requires no medication or intervention while in ED.   He is hypertensive but laboratory work up is reassuring for no end organ damage to suggest hypertensive emergency.  He is relatively asymptomatic aside from a mild headache.   Due to  patient's complex cardiac history and being relatively asymptomatic and without evidence of end-organ damage, I feel that patient's medication is best managed by his cardiologist or primary care physician who prescribe the medications and have made recent changes.   Disposition:   After consideration of the diagnostic results and the patients response to treatment, I feel that emergency department workup does not suggest an emergent condition requiring admission or immediate intervention beyond what has been performed at this time.  The patient is safe for discharge and has been instructed to return immediately for worsening symptoms, change in symptoms or any other concerns.  Discussed findings of patient work-up which was reassuring.  Advised patient to contact his primary care physician and/or cardiologist to schedule an appointment for medication management of his hypertension.  Discussed COVID precautions with patient who expressed his understanding.           Final Clinical Impression(s) / ED Diagnoses Final diagnoses:  COVID  Hypertension, unspecified type    Rx / DC Orders ED Discharge Orders     None         Lenard Simmer, [place] 01/26/22 1407    Pricilla Loveless, MD 01/30/22 [phone]

## 2022-01-29 NOTE — Telephone Encounter (Signed)
Appointment scheduled.

## 2022-01-29 NOTE — Telephone Encounter (Signed)
Pt called to FU on this request to have his BP medication adjusted.   Pt is asking for a call back from either provider, as soon as possible.

## 2022-01-29 NOTE — Telephone Encounter (Signed)
---  Caller stated that his BP 201/140 and he's been at the ER for this already yesterday. He stated that he's tried to go to the office as well, but the MD is out of town. He voiced that he has a severe headache.  01/27/2022 8:57:40 AM Go to ED Now Lucky Cowboy, RN, Levada Dy  Comments User: Raford Pitcher, RN Date/Time Eilene Ghazi Time): 01/27/2022 8:59:44 AM Caller stated that he's not going to the ER, that he's already been there and they won't do anything. He voiced that he has a "splitting headache and it's affecting his vision". I advised that he go to the ER, sounds like he could be having s/s that he may have a stroke if this continues. He's talking about double or tripling his meds ordered since he's already taken them today without any effect. Explained that was dangerous to do without MD knowing. He hung up.  Referrals GO TO FACILITY REFUSED  Pt has appt with PCP on 02/01/22

## 2022-01-29 NOTE — Telephone Encounter (Signed)
Error

## 2022-01-30 ENCOUNTER — Encounter (HOSPITAL_BASED_OUTPATIENT_CLINIC_OR_DEPARTMENT_OTHER): Payer: Medicare Other | Admitting: Physical Therapy

## 2022-02-01 ENCOUNTER — Encounter (HOSPITAL_BASED_OUTPATIENT_CLINIC_OR_DEPARTMENT_OTHER): Payer: Medicare Other | Admitting: Physical Therapy

## 2022-02-01 ENCOUNTER — Encounter: Payer: Self-pay | Admitting: Internal Medicine

## 2022-02-01 ENCOUNTER — Ambulatory Visit: Payer: Medicare Other | Admitting: Internal Medicine

## 2022-02-01 VITALS — BP 190/101 | HR 78 | Temp 98.0°F | Wt 177.0 lb

## 2022-02-01 DIAGNOSIS — I10 Essential (primary) hypertension: Secondary | ICD-10-CM

## 2022-02-01 DIAGNOSIS — U071 COVID-19: Secondary | ICD-10-CM

## 2022-02-01 MED ORDER — AMLODIPINE BESYLATE 5 MG PO TABS: 5.0000 mg | ORAL_TABLET | Freq: Every day | ORAL | 1 refills | Status: DC

## 2022-02-01 NOTE — Progress Notes (Signed)
Established Patient Office Visit     CC/Reason for Visit: Elevated blood pressure  HPI: Charles Lloyd. is a 81 y.o. male who is coming in today for the above mentioned reasons.  He was seen in the emergency department last week for URI symptoms and an elevated blood pressure.  He was found to have COVID, since he was on day 4-5 of symptoms it was decided not to treat him.  He has recovered well.  He has also been noticing elevated blood pressure.  He was asked to follow-up with me in regards to this.  2 measurements in office today are about 190/100.   Past Medical/Surgical History: Past Medical History:  Diagnosis Date   Acute tracheobronchitis 01/05/2019   B12 deficiency    Basal cell carcinoma (BCC) of left side of nose 01/28/2018   Cancer ([place])    Cardiomyopathy ([place]) 03/10/2018   With chronic atrial fibrillation   CHF (congestive heart failure) ([place]) 04/16/2013   Functional class II, ejection fraction 35-40%  Formatting of this note might be different from the original. Functional class II, ejection fraction 35-40%   Chronic anticoagulation    Chronic diastolic (congestive) heart failure ([place]) 04/16/2013   Functional class II, ejection fraction 35-40%  Last Assessment & Plan:  Clinically stable Volume well controlled meds reviewed   Chronic diastolic CHF (congestive heart failure) ([place]) 04/16/2013   Functional class II, ejection fraction 35-40%  Last Assessment & Plan:  Clinically stable Volume well controlled meds reviewed   Colon polyps 04/16/2013   Diabetic neuropathy (HCC)    Eosinophil count raised 02/17/2019   Essential hypertension 01/06/2010   Last Assessment & Plan:  Well controlled Continue med management   Gout    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  Steroid usage was discontinued several months ago.  Follow up with Dermatology as planned.   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   Last Assessment & Plan:  Id following ?ongoing abx managementy reported Request records   Infection of prosthetic right knee joint ([place])    Insomnia 03/04/2012   Grief with loss of wife 02/20/12  Last Assessment & Plan:  Improved sx  contineu xanax prn Se discussed   Lesion of liver 04/20/2019   -On cardiac CT 02/2019   Mild reactive airways disease    Neoplasm of prostate 04/16/2013   Neoplasm of prostate, 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 Transthoracic Echocardiogram-03/10/2013-[facility]: Normal left ventricular wall thickness and cavity size.  Global left ventricular systolic function is moderately reduced.  The estimated ejection fraction is 35-40%.  The left atrium is moderately enlarged.  The right atrium is mildly enlarged.  No significant valvular    Overweight (BMI 25.0-29.9) 10/22/2016   Persistent atrial fibrillation ([place]) 04/16/2013   Last Assessment & Plan:  Rate controlled Continue  med management eliquis for stroke preventino  Formatting of this note might be different from the original.  Drug  HX Current Rx Pre-ABL inefficacy Pre-ABL intolerant Post-ABL inefficacy Post-ABL intolerant max dose/24h M/Y end comments  sotalol                  dofetilide                  flecainide                  propafenone                  am   S/P TKR (total knee  replacement), bilateral    Secondary hypercoagulable state ([place]) 04/09/2019   Tinea cruris 04/16/2013   Type 2 diabetes mellitus with diabetic polyneuropathy, without long-term current use of insulin ([place]) 04/16/2013   Last Assessment & Plan:  Labs today Pt reports well controlled on ambulatory monitoring   Vitamin D deficiency 07/25/2018    Past Surgical History:  Procedure Laterality Date   ATRIAL FIBRILLATION ABLATION N/A 03/12/2019   Procedure: ATRIAL FIBRILLATION ABLATION;  Surgeon: [name], MD;  Location: [facility];  Service: Cardiovascular;  Laterality: N/A;   ATRIAL FIBRILLATION ABLATION N/A 04/29/2020   Procedure: ATRIAL FIBRILLATION ABLATION;  Surgeon: [name], MD;  Location: [facility];  Service: Cardiovascular;  Laterality: N/A;   CARDIAC ELECTROPHYSIOLOGY STUDY AND ABLATION     CARDIOVERSION     CARDIOVERSION N/A 10/10/2018   Procedure: CARDIOVERSION;  Surgeon: [name], MD;  Location: [facility] ENDOSCOPY;  Service: Cardiovascular;  Laterality: N/A;   CARDIOVERSION N/A 05/18/2020   Procedure: CARDIOVERSION;  Surgeon: [name], MD;  Location: [facility] ENDOSCOPY;  Service: Cardiovascular;  Laterality: N/A;   CARDIOVERSION N/A 12/09/2020   Procedure: CARDIOVERSION;  Surgeon: Lelon Perla, MD;  Location: [facility] ENDOSCOPY;  Service: Cardiovascular;  Laterality: N/A;   CHOLECYSTECTOMY     PROSTATECTOMY     REPLACEMENT TOTAL KNEE BILATERAL      Social History:  reports that he has never smoked. He has never used smokeless tobacco. He reports current alcohol use of about 14.0 standard drinks of alcohol per week. He reports that he does not use drugs.  Allergies: No Known Allergies  Family History:  Family History  Problem Relation Age of Onset   Cancer Mother    Depression Mother    Early death Mother    Cancer Father    Depression Father    Early death Father      Current Outpatient Medications:    ACCU-CHEK AVIVA PLUS test  strip, 1 EACH BY OTHER ROUTE DAILY. DX E11.9, Disp: 100 strip, Rfl: 12   albuterol (VENTOLIN HFA) 108 (90 Base) MCG/ACT inhaler, Inhale 1-2 puffs into the lungs every 6 (six) hours as needed for wheezing or shortness of breath., Disp: 8 g, Rfl: 2   amLODipine (NORVASC) 5 MG tablet, Take 1 tablet (5 mg total) by mouth daily., Disp: 90 tablet, Rfl: 1   atorvastatin (LIPITOR) 20 MG tablet, TAKE 1 TABLET DAILY, Disp: 90 tablet, Rfl: 0   carvedilol (COREG) 3.125 MG tablet, TAKE 1 TABLET BY MOUTH 2 TIMES DAILY., Disp: 180 tablet, Rfl: 0   Continuous Blood Gluc Receiver (FREESTYLE LIBRE 2 READER) DEVI, 1 each by Does not apply route daily., Disp: 1 each, Rfl: 2   Continuous Blood Gluc Sensor (FREESTYLE LIBRE 2 SENSOR) MISC, 1 each by Does not apply route  daily., Disp: 2 each, Rfl: 6   ELIQUIS 5 MG TABS tablet, TAKE 1 TABLET TWICE A DAY (Patient taking differently: Take 5 mg by mouth 2 (two) times daily.), Disp: 180 tablet, Rfl: 3   flecainide (TAMBOCOR) 100 MG tablet, TAKE 1 TABLET BY MOUTH TWICE A DAY, Disp: 180 tablet, Rfl: 3   fluticasone (FLONASE) 50 MCG/ACT nasal spray, Place 2 sprays into both nostrils daily., Disp: 16 g, Rfl: 2   fluticasone furoate-vilanterol (BREO ELLIPTA) 100-25 MCG/INH AEPB, Inhale 1 puff into the lungs daily., Disp: 60 each, Rfl: 11   gabapentin (NEURONTIN) 300 MG capsule, TAKE 2 CAPSULES BY MOUTH AT BEDTIME (Patient taking differently: Take 600 mg by mouth at bedtime.), Disp: 60 capsule, Rfl: 2   KLOR-CON M20 20 MEQ tablet, TAKE 1 TABLET BY MOUTH EVERY DAY, Disp: 90 tablet, Rfl: 3   montelukast (SINGULAIR) 10 MG tablet, Take 10 mg by mouth at bedtime. , Disp: , Rfl:    RESTASIS 0.05 % ophthalmic emulsion, Place 1 drop into both eyes 2 (two) times daily as needed (dry eyes)., Disp: , Rfl:    torsemide (DEMADEX) 20 MG tablet, Take 1 tablet (20 mg total) by mouth daily. Start taking from Monday only, Disp: 180 tablet, Rfl: 3   allopurinol (ZYLOPRIM) 300 MG tablet, TAKE 1 TABLET BY  MOUTH EVERY DAY (Patient not taking: Reported on 02/01/2022), Disp: 90 tablet, Rfl: 1   colchicine 0.6 MG tablet, Take 1 tablet (0.6 mg total) by mouth 2 (two) times daily. (Patient not taking: Reported on 02/01/2022), Disp: 60 tablet, Rfl: 2   insulin glargine (LANTUS SOLOSTAR) 100 UNIT/ML Solostar Pen, Inject 8 Units into the skin daily. (Patient not taking: Reported on 02/01/2022), Disp: 15 mL, Rfl: 0  Review of Systems:  Constitutional: Denies fever, chills, diaphoresis, appetite change and fatigue.  HEENT: Denies photophobia, eye pain, redness, hearing loss, ear pain, congestion, sore throat, rhinorrhea, sneezing, mouth sores, trouble swallowing, neck pain, neck stiffness and tinnitus.   Respiratory: Denies SOB, DOE, cough, chest tightness,  and wheezing.   Cardiovascular: Denies chest pain, palpitations and leg swelling.  Gastrointestinal: Denies nausea, vomiting, abdominal pain, diarrhea, constipation, blood in stool and abdominal distention.  Genitourinary: Denies dysuria, urgency, frequency, hematuria, flank pain and difficulty urinating.  Endocrine: Denies: hot or cold intolerance, sweats, changes in hair or nails, polyuria, polydipsia. Musculoskeletal: Denies myalgias, back pain, joint swelling, arthralgias and gait problem.  Skin: Denies pallor, rash and wound.  Neurological: Denies dizziness, seizures, syncope, weakness, light-headedness, numbness and headaches.  Hematological: Denies adenopathy. Easy bruising, personal or family bleeding history  Psychiatric/Behavioral: Denies suicidal ideation, mood changes, confusion, nervousness, sleep disturbance and agitation    Physical Exam: Vitals:   02/01/22 1533 02/01/22 1537  BP: (!) 190/100 (!) 190/101  Pulse: 78   Temp: 98 F (36.7 C)   TempSrc: Oral   SpO2: 98%   Weight: 177 lb (80.3 kg)     Body mass index is 25.4 kg/m.   Constitutional: NAD, calm, comfortable Eyes: PERRL, lids and conjunctivae normal ENMT: Mucous  membranes are moist. Respiratory: clear to auscultation bilaterally, no wheezing, no crackles. Normal respiratory effort. No accessory muscle use.  Cardiovascular: Regular rate and rhythm, no murmurs / rubs / gallops. No extremity edema.   Psychiatric: Normal judgment and insight. Alert and oriented x 3. Normal mood.    Impression and Plan:  Essential hypertension - Plan: amLODipine (NORVASC) 5 MG tablet  COVID  -He is now on day 6 of symptom initiation, no  COVID directed treatment was prescribed, he is apparently recovering well. -Blood pressure is certainly elevated.  He is on carvedilol 3.125 mg twice daily and torsemide 20 mg daily.  Add amlodipine 5 mg daily and he will return in 4 to 6 weeks for follow-up.  Time spent:30 minutes reviewing chart, interviewing and examining patient and formulating plan of care.      Lelon Frohlich, MD [facility]

## 2022-02-06 ENCOUNTER — Ambulatory Visit (HOSPITAL_BASED_OUTPATIENT_CLINIC_OR_DEPARTMENT_OTHER): Payer: Medicare Other | Attending: Pain Medicine | Admitting: Physical Therapy

## 2022-02-06 ENCOUNTER — Encounter (HOSPITAL_BASED_OUTPATIENT_CLINIC_OR_DEPARTMENT_OTHER): Payer: Medicare Other | Admitting: Physical Therapy

## 2022-02-06 DIAGNOSIS — R2681 Unsteadiness on feet: Secondary | ICD-10-CM | POA: Insufficient documentation

## 2022-02-06 DIAGNOSIS — M5459 Other low back pain: Secondary | ICD-10-CM | POA: Diagnosis present

## 2022-02-06 DIAGNOSIS — R296 Repeated falls: Secondary | ICD-10-CM | POA: Diagnosis present

## 2022-02-06 DIAGNOSIS — M6281 Muscle weakness (generalized): Secondary | ICD-10-CM | POA: Insufficient documentation

## 2022-02-06 NOTE — Therapy (Signed)
OUTPATIENT PHYSICAL THERAPY TREATMENT NOTE         Patient Name: Charles Lloyd. MRN: [id number] DOB:09-Oct-1940, 81 y.o., male Today's Date: 02/07/2022   PT End of Session - 02/06/22 1155       Visit Number 15    Number of Visits 20     Date for PT Re-Evaluation 02/07/22     Authorization Type [employer] MCR     PT Start Time 1110     PT Stop Time 1150     PT Time Calculation (min) 40 min     Activity Tolerance Patient tolerated treatment well     Behavior During Therapy WFL for tasks assessed/performed        Past Medical History:  Diagnosis Date   Acute tracheobronchitis 01/05/2019   B12 deficiency    Basal cell carcinoma (BCC) of left side of nose 01/28/2018   Cancer ([place])    Cardiomyopathy ([place]) 03/10/2018   With chronic atrial fibrillation   CHF (congestive heart failure) ([place]) 04/16/2013   Functional class II, ejection fraction 35-40%  Formatting of this note might be different from the original. Functional class II, ejection fraction 35-40%   Chronic anticoagulation    Chronic diastolic (congestive) heart failure ([place]) 04/16/2013   Functional class II, ejection fraction 35-40%  Last Assessment & Plan:  Clinically stable Volume well controlled meds reviewed   Chronic diastolic CHF (congestive heart failure) ([place]) 04/16/2013   Functional class II, ejection fraction 35-40%  Last Assessment & Plan:  Clinically stable Volume well controlled meds reviewed   Colon polyps 04/16/2013   Diabetic neuropathy (HCC)    Eosinophil count raised 02/17/2019   Essential hypertension 01/06/2010   Last Assessment & Plan:  Well controlled Continue med management   Gout    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  Steroid usage was discontinued several months ago.  Follow up with Dermatology as planned.   Hypercholesterolemia 01/06/2010   Last Assessment & Plan:  Repeat labs recommended   Hyperlipidemia associated with type  2 diabetes mellitus ([place]) 01/28/2018   Hypertensive heart disease with heart failure ([place]) 01/06/2010   Last Assessment & Plan:  Well controlled Continue med management   Hyponatremia 12/17/2018   Infected prosthetic knee joint ([place]) 06/24/2017   Last Assessment & Plan:  Id following ?ongoing abx managementy reported Request records   Infection of prosthetic right knee joint ([place])    Insomnia 03/04/2012   Grief with loss of wife 02/20/12  Last Assessment & Plan:  Improved sx  contineu xanax prn Se discussed   Lesion of liver 04/20/2019   -On cardiac CT 02/2019   Mild reactive airways disease    Neoplasm of prostate 04/16/2013   Neoplasm of prostate, malignant ([place]) 01/06/2010   [name] S/p radiation therapy   Last Assessment & Plan:  Repeat psa today No urinary sx Pt reported fatigue/poor appetite   On amiodarone therapy 09/07/2013   On continuous oral anticoagulation 01/28/2018   Other activity(E029.9) 04/20/2013   Formatting of this note might be different from the original. Transthoracic Echocardiogram-03/10/[facility]: Normal left ventricular wall thickness and cavity size.  Global left ventricular systolic function is moderately reduced.  The estimated ejection fraction is 35-40%.  The left atrium is moderately enlarged.  The right atrium is mildly enlarged.  No significant valvular    Overweight (BMI 25.0-29.9) 10/22/2016   Persistent atrial fibrillation ([place]) 04/16/2013   Last Assessment &  Plan:  Rate controlled Continue med management eliquis for stroke preventino  Formatting of this note might be different from the original.  Drug  HX Current Rx Pre-ABL inefficacy Pre-ABL intolerant Post-ABL inefficacy Post-ABL intolerant max dose/24h M/Y end comments  sotalol                  dofetilide                  flecainide                  propafenone                  am   S/P TKR (total knee replacement), bilateral    Secondary hypercoagulable state ([place]) 04/09/2019    Tinea cruris 04/16/2013   Type 2 diabetes mellitus with diabetic polyneuropathy, without long-term current use of insulin ([place]) 04/16/2013   Last Assessment & Plan:  Labs today Pt reports well controlled on ambulatory monitoring   Vitamin D deficiency 07/25/2018   Past Surgical History:  Procedure Laterality Date   ATRIAL FIBRILLATION ABLATION N/A 03/12/2019   Procedure: ATRIAL FIBRILLATION ABLATION;  Surgeon: [name], MD;  Location: [facility];  Service: Cardiovascular;  Laterality: N/A;   ATRIAL FIBRILLATION ABLATION N/A 04/29/2020   Procedure: ATRIAL FIBRILLATION ABLATION;  Surgeon: [name], MD;  Location: [facility];  Service: Cardiovascular;  Laterality: N/A;   CARDIAC ELECTROPHYSIOLOGY STUDY AND ABLATION     CARDIOVERSION     CARDIOVERSION N/A 10/10/2018   Procedure: CARDIOVERSION;  Surgeon: [name], MD;  Location: [facility];  Service: Cardiovascular;  Laterality: N/A;   CARDIOVERSION N/A 05/18/2020   Procedure: CARDIOVERSION;  Surgeon: [name], MD;  Location: [facility];  Service: Cardiovascular;  Laterality: N/A;   CARDIOVERSION N/A 12/09/2020   Procedure: CARDIOVERSION;  Surgeon: Lelon Perla, MD;  Location[facility];  Service: Cardiovascular;  Laterality: N/A;   CHOLECYSTECTOMY     PROSTATECTOMY     REPLACEMENT TOTAL KNEE BILATERAL     Patient Active Problem List   Diagnosis Date Noted   Acute renal failure superimposed on stage 3b chronic kidney disease ([place]) 11/16/2021   Uremia 11/16/2021   Gout due to renal impairment 10/17/2021   Pancytopenia ([place]) 08/05/2021   SIRS (systemic inflammatory response syndrome) ([place]) 08/04/2021   Chronic kidney disease, stage 3b ([place]) 12/30/2020   Multiple falls    Spinal stenosis    S/P TKR (total knee replacement), bilateral    Arthralgia 11/20/2019   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   Lesion of liver 04/20/2019   Secondary hypercoagulable state ([place]) 04/09/2019    Eosinophil count raised 02/17/2019   Acute tracheobronchitis 01/05/2019   Asthma    Vitamin D deficiency 07/25/2018   Chronic anticoagulation 03/06/2018   Hyperlipidemia associated with type 2 diabetes mellitus ([place]) 01/28/2018   Basal cell carcinoma (BCC) of left side of nose 01/28/2018   Infected prosthetic knee joint ([place]) 06/24/2017   B12 deficiency 10/22/2016   Overweight (BMI 25.0-29.9) 10/22/2016   Primary osteoarthritis of right knee 03/29/2015   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   Paroxysmal A-fib ([place]) 04/16/2013   Chronic diastolic (congestive) heart failure ([place]) 04/16/2013   Colon polyps 04/16/2013   NIDDM-2 with polyneuropathy and hyperglycemia 04/16/2013   Tinea cruris 04/16/2013   Diabetic neuropathy ([place]) 07/14/2012   Insomnia 03/04/2012   Hypertensive heart disease with heart failure ([place]) 01/06/2010   Neoplasm of prostate, malignant ([place]) 01/06/2010  Hypercholesterolemia 01/06/2010   Essential hypertension 01/06/2010    PCP: Isaac Bliss, [facility] Idler   REFERRING PROVIDER: Reece Agar, MD   REFERRING DIAG: [id number] (ICD-10-CM) - Spinal stenosis, lumbar region with neurogenic claudication   Rationale for Evaluation and Treatment Rehabilitation  THERAPY DIAG:  Unsteadiness on feet  Repeated falls  Muscle weakness (generalized)  Other low back pain  ONSET DATE: 11/15/2021   SUBJECTIVE:                                                                                                                                                                                           SUBJECTIVE STATEMENT: "My legs feel great".  Pt reports improved balance and has not had any issues with falls recently.  He reports compliance with HEP.  Pt reports improved LE strength.  Pt ambulates without an AD at home.  Pt has difficulty with ascending stairs but feels good with descending stairs.  Pt uses chair lift on stairs at home.  Pt had some fatigue after prior Rx though had no  adverse effects.  "My back is doing good."      PERTINENT HISTORY:  Lumbar anterolisthesis, Peripheral neuropathy, TKR bilat, A-fib, CHF, DM  PAIN:  Are you having pain? Yes: NPRS scale: Current:  0/10, Best:  1/10, Worst:  6/10 Pain location: central lumbar  Pain description: dull, achy Aggravating factors: none  Relieving factors: injections   FOTO: INITIAL/CURRENT:  59/63 ; Goal of 64 at visit #10    PRECAUTIONS: Fall and Other: severe neuropathy   WEIGHT BEARING RESTRICTIONS No  FALLS:  Has patient fallen in last 6 months? Yes. Number of falls 3.5 (caught himself on one fall, on the other 3 falls my legs just gave way like someone took my legs out from under me)  LIVING ENVIRONMENT: Lives with: lives alone and family is near by and can help him easily  Lives in: House/apartment Stairs:  two story townhouse with stair lift inside, no STE but does have to deal with one step going up to chair lift Has following equipment at home: Single point cane, Walker - 4 wheeled, and stair lift, raised toilet seats, shower stool   OCCUPATION: retired   PLOF: Independent, Independent with basic ADLs, and Requires assistive device for independence  PATIENT GOALS stop falling, try to gain some strength   OBJECTIVE:   DIAGNOSTIC FINDINGS:  IMPRESSION: 1. 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 2. Stable to slight progression of severe spinal stenosis and moderate neural foraminal stenosis at L2-3. 3. Slight progression of moderate spinal stenosis at L3-4.    TODAY'S TREATMENT   Therapeutic  Exercise: Pt performed: Standing heel raises 3x10  Lateral band walks with RTB around thighs with Ue's on rail x 3 laps Seated toe raises 3x10  Standing hip abd with TrA x10 reps without resistance and x10 reps with YTB Step ups 2x10 bilat LE's with UE support on rail     Neuro Re-ed Activities:            Marching on airex 2x15 reps with UE support Tandem stance with UE  support and x30 sec with frequent UE support with CGA Standing in staggered stance on airex 2x30 sec with occasional  UE support with SBA    Step ups on airex x 15 reps bilat with UE support on rail   Lateral step ups on airex with UE support x 15 bilat   Alt toe taps on 6 inch step with occasional to frequent UE assist and min assist 2x10 reps Tandem gait with SBA with UE support x 1 lap at rail   PATIENT EDUCATION:  Education details:  exercise form, exercise rationale, and POC.  Person educated: Patient Education method: Explanation, demonstration, verbal and tactile cues Education comprehension: verbalized understanding, returned demonstration, and verbal and visual cues required   HOME EXERCISE PROGRAM: Access Code: [email or web address] URL: [email or web address] Date: 12/01/2021 Prepared by: Ronny Flurry  Exercises - Supine Transversus Abdominis Bracing - Hands on Stomach  - 2 x daily - 7 x weekly - 2 sets - 10 reps - Supine March  - 1-2 x daily - 7 x weekly - 2 sets - 5-10 reps - Seated Hip Abduction with Resistance  - 1 x daily - 4 x weekly - 2 sets - 10 reps - Seated Knee Extension with Resistance  - 1 x daily - 3-4 x weekly - 2 sets - 10 reps - Seated March  - 1 x daily - 3-4 x weekly - 2 sets - 10 reps - Seated Heel Raise  - 1 x daily - 7 x weekly - 2 sets - 10 reps  ASSESSMENT:  CLINICAL IMPRESSION: Pt has improved stability with daily mobility.  He reports improved strength and balance.  Pt demonstrates improved tolerance to exercises as evidenced by performance of increased performance of standing exercises without rest breaks.  He has difficulty with alt toe tapping and requires assistance for balance.  Pt performed exercises well without c/o's.  Pt's back has been feeling good since he had his injection.  He denies lumbar pain currently.  Pt responded well to Rx reporting no pain and having no c/o's after Rx.         OBJECTIVE IMPAIRMENTS Abnormal gait,  cardiopulmonary status limiting activity, decreased balance, decreased coordination, decreased knowledge of use of DME, decreased mobility, difficulty walking, decreased strength, decreased safety awareness, increased edema, and impaired UE functional use.   ACTIVITY LIMITATIONS standing, squatting, stairs, transfers, bed mobility, reach over head, and locomotion level  PARTICIPATION LIMITATIONS: shopping, community activity, and yard work  PERSONAL FACTORS Age, Behavior pattern, Fitness, Past/current experiences, and Time since onset of injury/illness/exacerbation are also affecting patient's functional outcome.   REHAB POTENTIAL: Good  CLINICAL DECISION MAKING: Evolving/moderate complexity  EVALUATION COMPLEXITY: Moderate   GOALS: Goals reviewed with patient? Yes  SHORT TERM GOALS: Target date: 01/03/2022  Will be compliant with appropriate progressive HEP to include walking vs floor bike program  Baseline: Goal status: PROGRESSING  2.  Will be able to name 3 ways to reduce fall risk at home and in community  Baseline:  Goal status:  PARTIALLY MET  3.  Will demonstrate improved ankle dorsiflexor endurance and reduce related fall risk as evidenced by elimination of foot slap during 3MWT  Baseline:  Goal status: NOT MET  4. Will be independent with alternative ways to control edema in BLEs (such as spiral wrapping with ACE bandages) Baseline:  Goal status: not met    LONG TERM GOALS: Target date: 02/07/2022  MMT to improve by at least 1 grade in all weak groups  Baseline:  Goal status: NOT MET  2.  Will be able to ambulate at least 52f safely in 3MWT with use of SPC vs SBQC with no more than S level assist to improve community access  Baseline:  Goal status: performed with rollator but not met with cane  3.  Will score at least 37 on the [place] to show improved balance/reduced fall risk  Baseline:  Goal status:60% MET  4.  Will demonstrate improved UE strength as  evidenced by ability to lift rollator in/out of car for improved community access (for extended gait distances 10093f+) Baseline:  Goal status: GOAL MET  5.  Will be compliant with advanced HEP vs gym program to maintain functional gains and prevent recurrence of impairments  Baseline:  Goal status: ongoing     PLAN: PT FREQUENCY: 2x/week  PT DURATION: 5 weeks  PLANNED INTERVENTIONS: Therapeutic exercises, Therapeutic activity, Neuromuscular re-education, Balance training, Gait training, Patient/Family education, Self Care, Joint mobilization, Stair training, DME instructions, Aquatic Therapy, Taping, Ionotophoresis 21m39ml Dexamethasone, Manual therapy, and Re-evaluation.  PLAN FOR NEXT SESSION: Cont to progress strength, fxnl endurance, and balance. Strengthen bilat hips, quad, and ankle dorsiflexors.  Assess DF ROM.  PN vs discharge next visit   RobSelinda MichaelsI PT, DPT 02/07/22 10:11 PM

## 2022-02-07 ENCOUNTER — Encounter (HOSPITAL_BASED_OUTPATIENT_CLINIC_OR_DEPARTMENT_OTHER): Payer: Self-pay | Admitting: Physical Therapy

## 2022-02-08 ENCOUNTER — Encounter (HOSPITAL_BASED_OUTPATIENT_CLINIC_OR_DEPARTMENT_OTHER): Payer: Self-pay | Admitting: Physical Therapy

## 2022-02-08 ENCOUNTER — Encounter (HOSPITAL_BASED_OUTPATIENT_CLINIC_OR_DEPARTMENT_OTHER): Payer: Medicare Other | Admitting: Physical Therapy

## 2022-02-08 ENCOUNTER — Ambulatory Visit (HOSPITAL_BASED_OUTPATIENT_CLINIC_OR_DEPARTMENT_OTHER): Payer: Medicare Other | Admitting: Physical Therapy

## 2022-02-08 DIAGNOSIS — M6281 Muscle weakness (generalized): Secondary | ICD-10-CM

## 2022-02-08 DIAGNOSIS — R2681 Unsteadiness on feet: Secondary | ICD-10-CM

## 2022-02-08 DIAGNOSIS — R296 Repeated falls: Secondary | ICD-10-CM

## 2022-02-08 DIAGNOSIS — M5459 Other low back pain: Secondary | ICD-10-CM

## 2022-02-08 NOTE — Therapy (Signed)
OUTPATIENT PHYSICAL THERAPY TREATMENT NOTE         Patient Name: Charles Lloyd. MRN: [id number] DOB:06-Apr-1940, 81 y.o., male Today's Date: 02/08/2022   PT End of Session - 02/08/22 1153     Visit Number 16    Number of Visits 25    Date for PT Re-Evaluation 03/15/22    Authorization Type [employer] MCR    PT Start Time 4142    PT Stop Time 1230    PT Time Calculation (min) 41 min    Activity Tolerance Patient tolerated treatment well    Behavior During Therapy WFL for tasks assessed/performed                 Past Medical History:  Diagnosis Date   Acute tracheobronchitis 01/05/2019   B12 deficiency    Basal cell carcinoma (BCC) of left side of nose 01/28/2018   Cancer ([place])    Cardiomyopathy ([place]) 03/10/2018   With chronic atrial fibrillation   CHF (congestive heart failure) ([place]) 04/16/2013   Functional class II, ejection fraction 35-40%  Formatting of this note might be different from the original. Functional class II, ejection fraction 35-40%   Chronic anticoagulation    Chronic diastolic (congestive) heart failure ([place]) 04/16/2013   Functional class II, ejection fraction 35-40%  Last Assessment & Plan:  Clinically stable Volume well controlled meds reviewed   Chronic diastolic CHF (congestive heart failure) ([place]) 04/16/2013   Functional class II, ejection fraction 35-40%  Last Assessment & Plan:  Clinically stable Volume well controlled meds reviewed   Colon polyps 04/16/2013   Diabetic neuropathy (HCC)    Eosinophil count raised 02/17/2019   Essential hypertension 01/06/2010   Last Assessment & Plan:  Well controlled Continue med management   Gout    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  Steroid usage was discontinued several months ago.  Follow up with Dermatology as planned.   Hypercholesterolemia 01/06/2010   Last Assessment & Plan:  Repeat labs recommended   Hyperlipidemia associated with  type 2 diabetes mellitus ([place]) 01/28/2018   Hypertensive heart disease with heart failure ([place]) 01/06/2010   Last Assessment & Plan:  Well controlled Continue med management   Hyponatremia 12/17/2018   Infected prosthetic knee joint ([place]) 06/24/2017   Last Assessment & Plan:  Id following ?ongoing abx managementy reported Request records   Infection of prosthetic right knee joint ([place])    Insomnia 03/04/2012   Grief with loss of wife 02/20/12  Last Assessment & Plan:  Improved sx  contineu xanax prn Se discussed   Lesion of liver 04/20/2019   -On cardiac CT 02/2019   Mild reactive airways disease    Neoplasm of prostate 04/16/2013   Neoplasm of prostate, malignant ([place]) 01/06/2010   [name] S/p radiation therapy   Last Assessment & Plan:  Repeat psa today No urinary sx Pt reported fatigue/poor appetite   On amiodarone therapy 09/07/2013   On continuous oral anticoagulation 01/28/2018   Other activity(E029.9) 04/20/2013   Formatting of this note might be different from the original. Transthoracic Echocardiogram-03/10/2013-[facility]: Normal left ventricular wall thickness and cavity size.  Global left ventricular systolic function is moderately reduced.  The estimated ejection fraction is 35-40%.  The left atrium is moderately enlarged.  The right atrium is mildly enlarged.  No significant valvular    Overweight (BMI 25.0-29.9) 10/22/2016   Persistent atrial fibrillation ([place]) 04/16/2013   Last Assessment &  Plan:  Rate controlled Continue med management eliquis for stroke preventino  Formatting of this note might be different from the original.  Drug  HX Current Rx Pre-ABL inefficacy Pre-ABL intolerant Post-ABL inefficacy Post-ABL intolerant max dose/24h M/Y end comments  sotalol                  dofetilide                  flecainide                  propafenone                  am   S/P TKR (total knee replacement), bilateral    Secondary hypercoagulable state ([place]) 04/09/2019    Tinea cruris 04/16/2013   Type 2 diabetes mellitus with diabetic polyneuropathy, without long-term current use of insulin ([place]) 04/16/2013   Last Assessment & Plan:  Labs today Pt reports well controlled on ambulatory monitoring   Vitamin D deficiency 07/25/2018   Past Surgical History:  Procedure Laterality Date   ATRIAL FIBRILLATION ABLATION N/A 03/12/2019   Procedure: ATRIAL FIBRILLATION ABLATION;  Surgeon: [name], MD;  Location: [facility];  Service: Cardiovascular;  Laterality: N/A;   ATRIAL FIBRILLATION ABLATION N/A 04/29/2020   Procedure: ATRIAL FIBRILLATION ABLATION;  Surgeon: [name], MD;  Location: [facility];  Service: Cardiovascular;  Laterality: N/A;   CARDIAC ELECTROPHYSIOLOGY STUDY AND ABLATION     CARDIOVERSION     CARDIOVERSION N/A 10/10/2018   Procedure: CARDIOVERSION;  Surgeon: [name], MD;  Location: [facility];  Service: Cardiovascular;  Laterality: N/A;   CARDIOVERSION N/A 05/18/2020   Procedure: CARDIOVERSION;  Surgeon: [name], MD;  Location: [facility];  Service: Cardiovascular;  Laterality: N/A;   CARDIOVERSION N/A 12/09/2020   Procedure: CARDIOVERSION;  Surgeon: Lelon Perla, MD;  Location: [facility];  Service: Cardiovascular;  Laterality: N/A;   CHOLECYSTECTOMY     PROSTATECTOMY     REPLACEMENT TOTAL KNEE BILATERAL     Patient Active Problem List   Diagnosis Date Noted   Acute renal failure superimposed on stage 3b chronic kidney disease ([place]) 11/16/2021   Uremia 11/16/2021   Gout due to renal impairment 10/17/2021   Pancytopenia ([place]) 08/05/2021   SIRS (systemic inflammatory response syndrome) ([place]) 08/04/2021   Chronic kidney disease, stage 3b ([place]) 12/30/2020   Multiple falls    Spinal stenosis    S/P TKR (total knee replacement), bilateral    Arthralgia 11/20/2019   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   Lesion of liver 04/20/2019   Secondary hypercoagulable state ([place]) 04/09/2019    Eosinophil count raised 02/17/2019   Acute tracheobronchitis 01/05/2019   Asthma    Vitamin D deficiency 07/25/2018   Chronic anticoagulation 03/06/2018   Hyperlipidemia associated with type 2 diabetes mellitus ([place]) 01/28/2018   Basal cell carcinoma (BCC) of left side of nose 01/28/2018   Infected prosthetic knee joint ([place]) 06/24/2017   B12 deficiency 10/22/2016   Overweight (BMI 25.0-29.9) 10/22/2016   Primary osteoarthritis of right knee 03/29/2015   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   Paroxysmal A-fib ([place]) 04/16/2013   Chronic diastolic (congestive) heart failure ([place]) 04/16/2013   Colon polyps 04/16/2013   NIDDM-2 with polyneuropathy and hyperglycemia 04/16/2013   Tinea cruris 04/16/2013   Diabetic neuropathy ([place]) 07/14/2012   Insomnia 03/04/2012   Hypertensive heart disease with heart failure ([place]) 01/06/2010   Neoplasm of prostate, malignant ([place]) 01/06/2010  Hypercholesterolemia 01/06/2010   Essential hypertension 01/06/2010    PCP: Isaac Bliss, Olam Idler   REFERRING PROVIDER: Reece Agar, MD   REFERRING DIAG: [phone] (ICD-10-CM) - Spinal stenosis, lumbar region with neurogenic claudication   Rationale for Evaluation and Treatment Rehabilitation  THERAPY DIAG:  Unsteadiness on feet  Repeated falls  Muscle weakness (generalized)  Other low back pain  ONSET DATE: 11/15/2021   SUBJECTIVE:                                                                                                                                                                                           SUBJECTIVE STATEMENT:  He reports compliance with HEP.   RESPONSE TO PRIOR RX:  Pt denies any adverse effects after prior Rx.  FUNCTIONAL IMPROVEMENTS:  Pt reports improved balance, stability, and strength.  "Everything you've done has helped me out".  Pt removed the shower chair and stands in the shower.  Pt is able to perform stairs at his son's home.  Pt states he is self sufficient with  his ADLs/IADLs.  Pt enters the grocery sore without an AD and pushes the cart in the grocery store.  Pt has not had any falls. FUNCTIONAL LIMITATIONS:  He has difficulty ascending stairs at his home, but is fine descending stairs.  He uses a chair lift for stairs at home.      PERTINENT HISTORY:  Lumbar anterolisthesis, Peripheral neuropathy, TKR bilat, A-fib, CHF, DM  PAIN:  Are you having pain? Yes: NPRS scale: Current:  0/10, Best:  1/10, Worst:  6/10 Pain location: central lumbar  Pain description: dull, achy Aggravating factors: none  Relieving factors: injections   FOTO: INITIAL/CURRENT:  59/63 ; Goal of 64 at visit #10    PRECAUTIONS: Fall and Other: severe neuropathy   WEIGHT BEARING RESTRICTIONS No  FALLS:  Has patient fallen in last 6 months? Yes. Number of falls 3.5 (caught himself on one fall, on the other 3 falls my legs just gave way like someone took my legs out from under me)  LIVING ENVIRONMENT: Lives with: lives alone and family is near by and can help him easily  Lives in: House/apartment Stairs:  two story townhouse with stair lift inside, no STE but does have to deal with one step going up to chair lift Has following equipment at home: Single point cane, Walker - 4 wheeled, and stair lift, raised toilet seats, shower stool   OCCUPATION: retired   PLOF: Independent, Independent with basic ADLs, and Requires assistive device for independence  PATIENT GOALS stop falling, try to gain some strength   OBJECTIVE:   DIAGNOSTIC FINDINGS:  IMPRESSION: 1. 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 2. Stable to slight progression of severe spinal stenosis and moderate neural foraminal stenosis at L2-3. 3. Slight progression of moderate spinal stenosis at L3-4.    TODAY'S TREATMENT   Physical Performance testing:  Reviewed current function, pain levels, and HEP compliance for progress toward goals and progress note.    Pt continues to have limited  bilat ankle DF AROM.   LOWER EXTREMITY MMT:     MMT Right 11/7 Left 11/7 Right 12/14 Left 12/14  Hip flexion 4/5 4/5 4/5 4/5  Hip extension        Hip abduction 4-/5 3+/5 4-/5 4-/5  Hip adduction        Hip internal rotation        Hip external rotation        Knee flexion        Knee extension 4/5  4+/5 4+/5 5/5  Ankle dorsiflexion <3/5, unable to tolerate resistance 3+/5 Unable to tolerate resistance Unable to tolerate resistance  Ankle plantarflexion        Ankle inversion Unable to tolerate resistance 3+/5 4-/5 4+/5  Ankle eversion Unable to tolerate resistance Unable to tolerate resistance 4+/5 4-/5   (Blank rows = not tested)      FUNCTIONAL TESTS:  3 minute walk test:  Prior:  550 ft with rollator          Current:  346 ft with SPC.  Pt only ambulated 2 mins due to having a fall.  Gait:  Pt ambulated with SPC with gait belt.  Pt demonstrates improved gait speed.  Pt does have bilat foot slap.  Pt had good stability during the walking test until he fell.  Pt ambulates in the clinic with rollator with good Wb'ing thru bilat LE's with good stability.    FOTO:  51 which has not changed from prior.  Goal of 64 at visit #10     Therapeutic Exercise:   Reviewed HEP and updated HEP. Pt performed seated toe raises 2x10   PATIENT EDUCATION:  Education details:  exercise form, exercise rationale, and POC.  PT instructed pt to call MD if he has any pain later.    Person educated: Patient Education method: Explanation, demonstration, verbal and tactile cues Education comprehension: verbalized understanding, returned demonstration, and verbal and visual cues required   HOME EXERCISE PROGRAM: Access Code: [email or web address] URL: [email or web address] Date: 12/01/2021 Prepared by: Ronny Flurry  Exercises - Supine Transversus Abdominis Bracing - Hands on Stomach  - 2 x daily - 7 x weekly - 2 sets - 10 reps - Supine March  - 1-2 x daily - 7 x weekly - 2 sets - 5-10  reps - Seated Hip Abduction with Resistance  - 1 x daily - 4 x weekly - 2 sets - 10 reps - Seated Knee Extension with Resistance  - 1 x daily - 3-4 x weekly - 2 sets - 10 reps - Seated March  - 1 x daily - 3-4 x weekly - 2 sets - 10 reps - Seated Heel Raise  - 1 x daily - 7 x weekly - 2 sets - 10 reps  Updated HEP: - Seated Toe Raise  - 1 x daily - 7 x weekly - 2 sets - 10 reps  ASSESSMENT:  CLINICAL IMPRESSION: Pt is progressing well with function, balance, and mobility as evidenced by subjective reports.  He reports improved ambulation and balance with his daily mobility.  Pt feels more  steady with ambulation and denied having any falls prior to today.  He is ambulating into a grocery store without AD and uses the cart in the grocery store.  Pt has increased ambulation distance without AD.  Pt is standing to take a shower now.  Pt continues to have difficulty with performing stairs though is improving.  Pt continues to have weakness in bilat hips and ankles though is improving.  Pt has improved with gait though still has bilat foot slap.  He continues to have significant weakness in bilat ant tibialis and decreased bilat ankle DF AROM.  Pt's back has been feeling good since he had his injection.  He denies lumbar pain currently.   Pt was unable to complete 3 MWT due to falling.  Pt was ambulating with good speed with SPC.  Pt was wearing a gait belt and PT walked beside pt the entire test.  He ambulated 346 ft in 2 mins and fell forward to the floor at the 2 minute mark.  PT grabbed the gait belt, but was unable to prevent the fall.  PT assessed pt and he states he feels fine and has no pain.  PT assisted pt with standing up and he walked back to the clinic.  PT had pt rest.  Pt states he felt fine and had no pain anywhere.  Safety Zone Portal form completed.  PT instructed pt to call MD if he has any pain later.   PT modified goals.  He is making slow progress toward goals though has made improve  functionally.  Pt states he felt fine and had no pain anywhere at the end of Rx.  Pt should benefit from cont skilled PT services to address ongoing goals, maximize functional mobility, and to improve safety with mobility.        OBJECTIVE IMPAIRMENTS Abnormal gait, cardiopulmonary status limiting activity, decreased balance, decreased coordination, decreased knowledge of use of DME, decreased mobility, difficulty walking, decreased strength, decreased safety awareness, increased edema, and impaired UE functional use.   ACTIVITY LIMITATIONS standing, squatting, stairs, transfers, bed mobility, reach over head, and locomotion level  PARTICIPATION LIMITATIONS: shopping, community activity, and yard work  PERSONAL FACTORS Age, Behavior pattern, Fitness, Past/current experiences, and Time since onset of injury/illness/exacerbation are also affecting patient's functional outcome.   REHAB POTENTIAL: Good  CLINICAL DECISION MAKING: Evolving/moderate complexity  EVALUATION COMPLEXITY: Moderate   GOALS: Goals reviewed with patient? Yes  SHORT TERM GOALS: Target date: 01/03/2022  Will be compliant with appropriate progressive HEP  Baseline:  Modified goal Goal status:  ONGOING  2.  Will be able to name 3 ways to reduce fall risk at home and in community  Baseline:  Goal status: PARTIALLY MET  3.  Will demonstrate improved ankle dorsiflexor endurance and reduce related fall risk as evidenced by elimination of foot slap during 3MWT  Baseline:  Goal status: NOT MET  4. Will be independent with alternative ways to control edema in BLEs (such as spiral wrapping with ACE bandages) Baseline:  Goal status: Discontinue goal.  Pt states swelling has been good since medication adjustment.   LONG TERM GOALS: Target date: 03/15/2022   MMT to improve by at least 1 grade in all weak groups  Baseline:  Goal status:  PROGRESSING  2.  Will be able to ambulate at least 572f safely in 3MWT with use  of SPC vs SBQC with no more than S level assist to improve community access  Baseline:  Goal status: HAS  MET WITH ROLLATOR BUT NOT MET WITH CANE  3.  Will score at least 37 on the Berg to show improved balance/reduced fall risk  Baseline:  Goal status:60% MET  4.  Will demonstrate improved UE strength as evidenced by ability to lift rollator in/out of car for improved community access (for extended gait distances 1019f +) Baseline:  Goal status: GOAL MET  5.  Will be compliant with advanced HEP vs gym program to maintain functional gains and prevent recurrence of impairments  Baseline:  Goal status: ongoing     PLAN: PT FREQUENCY: 2x/week  PT DURATION: 4-5 weeks  PLANNED INTERVENTIONS: Therapeutic exercises, Therapeutic activity, Neuromuscular re-education, Balance training, Gait training, Patient/Family education, Self Care, Joint mobilization, Stair training, DME instructions, Aquatic Therapy, Taping, Ionotophoresis 445mml Dexamethasone, Manual therapy, and Re-evaluation.  PLAN FOR NEXT SESSION: Cont to progress strength, fxnl endurance, and balance. Strengthen bilat hips, quad, and ankle dorsiflexors.  Pt Assess DF ROM.  may need to consider AFOs in the future if gait/ankle strength does not improve significantly with PT.   PN completed.    RoSelinda MichaelsII PT, DPT 02/08/22 5:35 PM

## 2022-02-09 ENCOUNTER — Telehealth (HOSPITAL_BASED_OUTPATIENT_CLINIC_OR_DEPARTMENT_OTHER): Payer: Self-pay | Admitting: Physical Therapy

## 2022-02-09 NOTE — Telephone Encounter (Signed)
PT called pt this morning at 10:14 AM to see how pt is doing since the fall yesterday.  Pt states he feels fine and has no pain anywhere.  Pt reports having no pain since the fall.  Pt was appreciative of the call.   Selinda Michaels III PT, DPT 02/09/22 4:04 PM

## 2022-02-21 ENCOUNTER — Other Ambulatory Visit: Payer: Self-pay | Admitting: Internal Medicine

## 2022-02-27 DIAGNOSIS — M109 Gout, unspecified: Secondary | ICD-10-CM | POA: Insufficient documentation

## 2022-03-01 ENCOUNTER — Other Ambulatory Visit: Payer: Self-pay | Admitting: *Deleted

## 2022-03-01 DIAGNOSIS — G6289 Other specified polyneuropathies: Secondary | ICD-10-CM

## 2022-03-01 MED ORDER — GABAPENTIN 300 MG PO CAPS: 600.0000 mg | ORAL_CAPSULE | Freq: Every day | ORAL | 0 refills | Status: DC

## 2022-03-01 NOTE — Progress Notes (Signed)
Cardiology Office Note:    Date:  03/02/2022   ID:  [name], DOB 1940-09-10, MRN [id number]  PCP:  Isaac Bliss, [name], MD  Cardiologist:  [name], MD    Referring MD: Isaac Bliss, Estel*    ASSESSMENT:    1. Paroxysmal atrial fibrillation (HCC)   2. High risk medication use   3. Secondary hypercoagulable state ([place])   4. Primary hypertension    PLAN:    In order of problems listed above:  He is maintaining sinus rhythm on flecainide and concerned about PVCs typically flecainide is very effective limiting the arrhythmia we will check a level to be sure he is not toxic and will apply a 7-day ZIO monitor and if he has a high frequency I will refer him back to EP for evaluation he may benefit from alternate antiarrhythmic drug therapy. Continue his anticoagulant Appears to be much better controlled he will start checking at home with good device and record and review at his next office visit he like to be seen in the [place] office   Next appointment: 3 months with Dr. Agustin Cree   Medication Adjustments/Labs and Tests Ordered: Current medicines are reviewed at length with the patient today.  Concerns regarding medicines are outlined above.  No orders of the defined types were placed in this encounter.  No orders of the defined types were placed in this encounter.   Chief Complaint  Patient presents with   Follow-up   Atrial Fibrillation    History of Present Illness:    Charles Lloyd. is a 82 y.o. male with a hx of paroxysmal atrial fibrillation with EP catheter ablation 2015 and in 2021 and 2022 and subsequent recurrence maintaining sinus rhythm on flecainide chronic anticoagulation and hypertensive heart disease last seen 06/23/2021.  Compliance with diet, lifestyle and medications: Yes  From a cardiac perspective [name] has done well maintaining sinus rhythm and taking a lower dose of diuretic. He is quite bothered by an  unsteady gait.  He also has chronic back pain. No angina edema shortness of breath chest pain palpitation syncope  Recent labs showed LDL cholesterol 59 total 120 A1c 5.6 creatinine 1.12 potassium 4.3 hemoglobin 12.2 on 12 02/2021 Past Medical History:  Diagnosis Date   Acute tracheobronchitis 01/05/2019   B12 deficiency    Basal cell carcinoma (BCC) of left side of nose 01/28/2018   Cancer ([place])    Cardiomyopathy ([place]) 03/10/2018   With chronic atrial fibrillation   CHF (congestive heart failure) ([place]) 04/16/2013   Functional class II, ejection fraction 35-40%  Formatting of this note might be different from the original. Functional class II, ejection fraction 35-40%   Chronic anticoagulation    Chronic diastolic (congestive) heart failure ([place]) 04/16/2013   Functional class II, ejection fraction 35-40%  Last Assessment & Plan:  Clinically stable Volume well controlled meds reviewed   Chronic diastolic CHF (congestive heart failure) ([place]) 04/16/2013   Functional class II, ejection fraction 35-40%  Last Assessment & Plan:  Clinically stable Volume well controlled meds reviewed   Colon polyps 04/16/2013   Diabetic neuropathy (HCC)    Eosinophil count raised 02/17/2019   Essential hypertension 01/06/2010   Last Assessment & Plan:  Well controlled Continue med management   Gout    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  Steroid usage was discontinued several months ago.  Follow up with Dermatology as  planned.   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   Last Assessment & Plan:  Id following ?ongoing abx  managementy reported Request records   Infection of prosthetic right knee joint ([place])    Insomnia 03/04/2012   Grief with loss of wife 02/20/12  Last Assessment & Plan:  Improved sx  contineu xanax prn Se discussed   Lesion of liver 04/20/2019   -On cardiac CT 02/2019   Mild reactive airways disease    Neoplasm of prostate 04/16/2013   Neoplasm of prostate, 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 Transthoracic Echocardiogram-03/10/[facility]: Normal left ventricular wall thickness and cavity size.  Global left ventricular systolic function is moderately reduced.  The estimated ejection fraction is 35-40%.  The left atrium is moderately enlarged.  The right atrium is mildly enlarged.  No significant valvular    Overweight (BMI 25.0-29.9) 10/22/2016   Persistent atrial fibrillation ([place]) 04/16/2013   Last Assessment & Plan:  Rate controlled Continue med management eliquis for stroke preventino  Formatting of this note might be different from the original.  Drug  HX Current Rx Pre-ABL inefficacy Pre-ABL intolerant Post-ABL inefficacy Post-ABL intolerant max dose/24h M/Y end comments  sotalol                  dofetilide                  flecainide                  propafenone                  am   S/P TKR (total knee replacement), bilateral    Secondary hypercoagulable state ([place]) 04/09/2019   Tinea cruris 04/16/2013   Type 2 diabetes mellitus with diabetic polyneuropathy, without long-term current use of insulin ([place]) 04/16/2013   Last Assessment & Plan:  Labs today Pt reports well controlled on ambulatory monitoring   Vitamin D deficiency 07/25/2018    Past Surgical History:   Procedure Laterality Date   ATRIAL FIBRILLATION ABLATION N/A 03/12/2019   Procedure: ATRIAL FIBRILLATION ABLATION;  Surgeon: [name], MD;  Location: [place] [facility];  Service: Cardiovascular;  Laterality: N/A;   ATRIAL FIBRILLATION ABLATION N/A 04/29/2020   Procedure: ATRIAL FIBRILLATION ABLATION;  Surgeon: [name], MD;  Location: [place] [facility];  Service: Cardiovascular;  Laterality: N/A;   CARDIAC ELECTROPHYSIOLOGY STUDY AND ABLATION     CARDIOVERSION     CARDIOVERSION N/A 10/10/2018   Procedure: CARDIOVERSION;  Surgeon: [name], MD;  Location: [place];  Service: Cardiovascular;  Laterality: N/A;   CARDIOVERSION N/A 05/18/2020   Procedure: CARDIOVERSION;  Surgeon: [name], MD;  Location: [facility] ENDOSCOPY;  Service: Cardiovascular;  Laterality: N/A;   CARDIOVERSION N/A 12/09/2020   Procedure: CARDIOVERSION;  Surgeon: Lelon Perla, MD;  Location: [facility] ENDOSCOPY;  Service: Cardiovascular;  Laterality:  N/A;   CHOLECYSTECTOMY     PROSTATECTOMY     REPLACEMENT TOTAL KNEE BILATERAL      Current Medications: Current Meds  Medication Sig   ACCU-CHEK AVIVA PLUS test strip 1 EACH BY OTHER ROUTE DAILY. DX E11.9   albuterol (VENTOLIN HFA) 108 (90 Base) MCG/ACT inhaler Inhale 1-2 puffs into the lungs every 6 (six) hours as needed for wheezing or shortness of breath.   allopurinol (ZYLOPRIM) 300 MG tablet TAKE 1 TABLET BY MOUTH EVERY DAY   amLODipine (NORVASC) 5 MG tablet Take 1 tablet (5 mg total) by mouth daily.   atorvastatin (LIPITOR) 20 MG tablet TAKE 1 TABLET DAILY   carvedilol (COREG) 3.125 MG tablet TAKE 1 TABLET BY MOUTH TWICE A DAY   colchicine 0.6 MG tablet Take 1 tablet (0.6 mg total) by mouth 2 (two) times daily.   Continuous Blood Gluc Receiver (FREESTYLE LIBRE 2 READER) DEVI 1 each by Does not apply route daily.   Continuous Blood Gluc Sensor (FREESTYLE LIBRE 2 SENSOR) MISC 1 each by Does not apply route daily.   ELIQUIS 5 MG TABS  tablet TAKE 1 TABLET TWICE A DAY (Patient taking differently: Take 5 mg by mouth 2 (two) times daily.)   flecainide (TAMBOCOR) 100 MG tablet TAKE 1 TABLET BY MOUTH TWICE A DAY   fluticasone (FLONASE) 50 MCG/ACT nasal spray Place 2 sprays into both nostrils daily.   fluticasone furoate-vilanterol (BREO ELLIPTA) 100-25 MCG/INH AEPB Inhale 1 puff into the lungs daily.   gabapentin (NEURONTIN) 300 MG capsule Take 2 capsules (600 mg total) by mouth at bedtime.   insulin glargine (LANTUS SOLOSTAR) 100 UNIT/ML Solostar Pen Inject 8 Units into the skin daily.   KLOR-CON M20 20 MEQ tablet TAKE 1 TABLET BY MOUTH EVERY DAY   montelukast (SINGULAIR) 10 MG tablet Take 10 mg by mouth at bedtime.    RESTASIS 0.05 % ophthalmic emulsion Place 1 drop into both eyes 2 (two) times daily as needed (dry eyes).   torsemide (DEMADEX) 20 MG tablet Take 1 tablet (20 mg total) by mouth daily. Start taking from Monday only     Allergies:   Patient has no known allergies.   Social History   Socioeconomic History   Marital status: Widowed    Spouse name: Not on file   Number of children: Not on file   Years of education: Not on file   Highest education level: Not on file  Occupational History   Not on file  Tobacco Use   Smoking status: Never    Passive exposure: Never   Smokeless tobacco: Never  Vaping Use   Vaping Use: Never used  Substance and Sexual Activity   Alcohol use: Yes    Alcohol/week: 14.0 standard drinks of alcohol    Types: 14 Glasses of wine per week   Drug use: Never   Sexual activity: Not Currently  Other Topics Concern   Not on file  Social History Narrative   Not on file   Social Determinants of Health   Financial Resource Strain: Not on file  Food Insecurity: No Food Insecurity (11/16/2021)   Hunger Vital Sign    Worried About Running Out of Food in the Last Year: Never true    Ran Out of Food in the Last Year: Never true  Transportation Needs: No Transportation Needs  (11/16/2021)   PRAPARE - Hydrologist (Medical): No    Lack of Transportation (Non-Medical): No  Physical Activity: Not on  file  Stress: Not on file  Social Connections: Not on file     Family History: The patient's family history includes Cancer in his father and mother; Depression in his father and mother; Early death in his father and mother. ROS:   Please see the history of present illness.    All other systems reviewed and are negative.  EKGs/Labs/Other Studies Reviewed:    The following studies were reviewed today:  EKG:  EKG ordered today and personally reviewed.  The ekg ordered today demonstrates sinus rhythm first-degree AV block he has ventricular bigeminy and frequent PVCs His EKG from September also showed frequent PVCs this was a new finding  Recent Labs: 11/16/2021: Magnesium 2.2; TSH 1.234 01/26/2022: ALT 27; B Natriuretic Peptide 380.8; BUN 33; Creatinine, Ser 1.12; Hemoglobin 12.2; Platelets 173; Potassium 4.3; Sodium 138  Recent Lipid Panel    Component Value Date/Time   CHOL 120 05/20/2020 0914   TRIG 73 05/20/2020 0914   HDL 46 05/20/2020 0914   CHOLHDL 2.6 05/20/2020 0914   CHOLHDL 3 07/24/2018 1058   VLDL 25.0 07/24/2018 1058   LDLCALC 59 05/20/2020 0914    Physical Exam:    VS:  BP (!) 146/74 (BP Location: Right Arm, Patient Position: Sitting)   Pulse 75   Ht '5\' 10"'$  (1.778 m)   Wt 181 lb (82.1 kg)   SpO2 99%   BMI 25.97 kg/m     Wt Readings from Last 3 Encounters:  03/02/22 181 lb (82.1 kg)  02/01/22 177 lb (80.3 kg)  01/26/22 178 lb 9.2 oz (81 kg)     GEN:  Well nourished, well developed in no acute distress HEENT: Normal NECK: No JVD; No carotid bruits LYMPHATICS: No lymphadenopathy CARDIAC: RRR, no murmurs, rubs, gallops RESPIRATORY:  Clear to auscultation without rales, wheezing or rhonchi  ABDOMEN: Soft, non-tender, non-distended MUSCULOSKELETAL:  No edema; No deformity  SKIN: Warm and dry NEUROLOGIC:   Alert and oriented x 3 PSYCHIATRIC:  Normal affect    Signed, [name], MD  03/02/2022 10:07 AM    [place]

## 2022-03-02 ENCOUNTER — Ambulatory Visit: Payer: Medicare Other | Attending: Cardiology | Admitting: Cardiology

## 2022-03-02 ENCOUNTER — Encounter: Payer: Self-pay | Admitting: Cardiology

## 2022-03-02 ENCOUNTER — Ambulatory Visit: Payer: Medicare Other | Attending: Cardiology

## 2022-03-02 VITALS — BP 146/74 | HR 75 | Ht 70.0 in | Wt 181.0 lb

## 2022-03-02 DIAGNOSIS — D6869 Other thrombophilia: Secondary | ICD-10-CM | POA: Diagnosis not present

## 2022-03-02 DIAGNOSIS — I493 Ventricular premature depolarization: Secondary | ICD-10-CM

## 2022-03-02 DIAGNOSIS — I480 Paroxysmal atrial fibrillation: Secondary | ICD-10-CM

## 2022-03-02 DIAGNOSIS — I10 Essential (primary) hypertension: Secondary | ICD-10-CM | POA: Diagnosis not present

## 2022-03-02 DIAGNOSIS — Z79899 Other long term (current) drug therapy: Secondary | ICD-10-CM | POA: Diagnosis not present

## 2022-03-02 NOTE — Patient Instructions (Addendum)
Medication Instructions:  Your physician recommends that you continue on your current medications as directed. Please refer to the Current Medication list given to you today.  *If you need a refill on your cardiac medications before your next appointment, please call your pharmacy*   Lab Work: Your physician recommends that you return for lab work in:   Labs today: Flecanide level  If you have labs (blood work) drawn today and your tests are completely normal, you will receive your results only by: [place] (if you have [place]) OR A paper copy in the mail If you have any lab test that is abnormal or we need to change your treatment, we will call you to review the results.   Testing/Procedures: A zio monitor was ordered today. It will remain on for 7 days. You will then return monitor and event diary in provided box. It takes 1-2 weeks for report to be downloaded and returned to [place]. We will call you with the results. If monitor falls off or has orange flashing light, please call Zio for further instructions.     Follow-Up: At [facility], you and your health needs are our priority.  As part of our continuing mission to provide you with exceptional heart care, we have created designated Provider Care Teams.  These Care Teams include your primary Cardiologist (physician) and Advanced Practice Providers (APPs -  Physician Assistants and Nurse Practitioners) who all work together to provide you with the care you need, when you need it.  We recommend signing up for the patient portal called "MyChart".  Sign up information is provided on this After Visit Summary.  MyChart is used to connect with patients for Virtual Visits (Telemedicine).  Patients are able to view lab/test results, encounter notes, upcoming appointments, etc.  Non-urgent messages can be sent to your provider as well.   To learn more about what you can do with MyChart, go to [email or web address].    Your next  appointment:   3 month(s)  The format for your next appointment:   In Person  Provider:   [facility]  Other Instructions None  Important Information About Sugar          1. Avoid all over-the-counter antihistamines except Claritin/Loratadine and Zyrtec/Cetrizine. 2. Avoid all combination including cold sinus allergies flu decongestant and sleep medications 3. You can use Robitussin DM Mucinex and Mucinex DM for cough. 4. can use Tylenol aspirin ibuprofen and naproxen but no combinations such as sleep or sinus.

## 2022-03-06 ENCOUNTER — Ambulatory Visit (HOSPITAL_BASED_OUTPATIENT_CLINIC_OR_DEPARTMENT_OTHER): Payer: Medicare Other | Attending: Pain Medicine | Admitting: Physical Therapy

## 2022-03-06 ENCOUNTER — Encounter (HOSPITAL_BASED_OUTPATIENT_CLINIC_OR_DEPARTMENT_OTHER): Payer: Self-pay | Admitting: Physical Therapy

## 2022-03-06 DIAGNOSIS — M5459 Other low back pain: Secondary | ICD-10-CM | POA: Diagnosis present

## 2022-03-06 DIAGNOSIS — M6281 Muscle weakness (generalized): Secondary | ICD-10-CM | POA: Diagnosis present

## 2022-03-06 DIAGNOSIS — R2681 Unsteadiness on feet: Secondary | ICD-10-CM

## 2022-03-06 DIAGNOSIS — R296 Repeated falls: Secondary | ICD-10-CM | POA: Diagnosis present

## 2022-03-06 NOTE — Therapy (Signed)
OUTPATIENT PHYSICAL THERAPY TREATMENT NOTE         Patient Name: Charles Lloyd. MRN: [id number] DOB:1940/04/14, 82 y.o., male Today's Date: 03/06/2022         Past Medical History:  Diagnosis Date   Acute tracheobronchitis 01/05/2019   B12 deficiency    Basal cell carcinoma (BCC) of left side of nose 01/28/2018   Cancer ([place])    Cardiomyopathy ([place]) 03/10/2018   With chronic atrial fibrillation   CHF (congestive heart failure) ([place]) 04/16/2013   Functional class II, ejection fraction 35-40%  Formatting of this note might be different from the original. Functional class II, ejection fraction 35-40%   Chronic anticoagulation    Chronic diastolic (congestive) heart failure ([place]) 04/16/2013   Functional class II, ejection fraction 35-40%  Last Assessment & Plan:  Clinically stable Volume well controlled meds reviewed   Chronic diastolic CHF (congestive heart failure) ([place]) 04/16/2013   Functional class II, ejection fraction 35-40%  Last Assessment & Plan:  Clinically stable Volume well controlled meds reviewed   Colon polyps 04/16/2013   Diabetic neuropathy (HCC)    Eosinophil count raised 02/17/2019   Essential hypertension 01/06/2010   Last Assessment & Plan:  Well controlled Continue med management   Gout    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  Steroid usage was discontinued several months ago.  Follow up with Dermatology as planned.   Hypercholesterolemia 01/06/2010   Last Assessment & Plan:  Repeat labs recommended   Hyperlipidemia associated with type 2 diabetes mellitus ([place]) 01/28/2018   Hypertensive heart disease with heart failure ([place]) 01/06/2010   Last Assessment & Plan:  Well controlled Continue med management   Hyponatremia 12/17/2018   Infected prosthetic knee joint ([place]) 06/24/2017   Last Assessment & Plan:  Id following ?ongoing abx managementy reported Request records   Infection of  prosthetic right knee joint ([place])    Insomnia 03/04/2012   Grief with loss of wife 02/20/12  Last Assessment & Plan:  Improved sx  contineu xanax prn Se discussed   Lesion of liver 04/20/2019   -On cardiac CT 02/2019   Mild reactive airways disease    Neoplasm of prostate 04/16/2013   Neoplasm of prostate, malignant ([place]) 01/06/2010   [name] S/p radiation therapy   Last Assessment & Plan:  Repeat psa today No urinary sx Pt reported fatigue/poor appetite   On amiodarone therapy 09/07/2013   On continuous oral anticoagulation 01/28/2018   Other activity(E029.9) 04/20/2013   Formatting of this note might be different from the original. Transthoracic Echocardiogram-03/10/2013-[facility]: Normal left ventricular wall thickness and cavity size.  Global left ventricular systolic function is moderately reduced.  The estimated ejection fraction is 35-40%.  The left atrium is moderately enlarged.  The right atrium is mildly enlarged.  No significant valvular    Overweight (BMI 25.0-29.9) 10/22/2016   Persistent atrial fibrillation ([place]) 04/16/2013   Last Assessment & Plan:  Rate controlled Continue med management eliquis for stroke preventino  Formatting of this note might be different from the original.  Drug  HX Current Rx Pre-ABL inefficacy Pre-ABL intolerant Post-ABL inefficacy Post-ABL intolerant max dose/24h M/Y end comments  sotalol                  dofetilide                  flecainide  propafenone                  am   S/P TKR (total knee replacement), bilateral    Secondary hypercoagulable state ([place]) 04/09/2019   Tinea cruris 04/16/2013   Type 2 diabetes mellitus with diabetic polyneuropathy, without long-term current use of insulin ([place]) 04/16/2013   Last Assessment & Plan:  Labs today Pt reports well controlled on ambulatory monitoring   Vitamin D deficiency 07/25/2018   Past Surgical History:  Procedure Laterality Date   ATRIAL FIBRILLATION ABLATION N/A  03/12/2019   Procedure: ATRIAL FIBRILLATION ABLATION;  Surgeon: [name], MD;  Location: [place] [facility];  Service: Cardiovascular;  Laterality: N/A;   ATRIAL FIBRILLATION ABLATION N/A 04/29/2020   Procedure: ATRIAL FIBRILLATION ABLATION;  Surgeon: [name], MD;  Location: [place];  Service: Cardiovascular;  Laterality: N/A;   CARDIAC ELECTROPHYSIOLOGY STUDY AND ABLATION     CARDIOVERSION     CARDIOVERSION N/A 10/10/2018   Procedure: CARDIOVERSION;  Surgeon: [name], MD;  Location: [facility] ENDOSCOPY;  Service: Cardiovascular;  Laterality: N/A;   CARDIOVERSION N/A 05/18/2020   Procedure: CARDIOVERSION;  Surgeon: [name], MD;  Location: [facility] ENDOSCOPY;  Service: Cardiovascular;  Laterality: N/A;   CARDIOVERSION N/A 12/09/2020   Procedure: CARDIOVERSION;  Surgeon: Lelon Perla, MD;  Location: [facility] ENDOSCOPY;  Service: Cardiovascular;  Laterality: N/A;   CHOLECYSTECTOMY     PROSTATECTOMY     REPLACEMENT TOTAL KNEE BILATERAL     Patient Active Problem List   Diagnosis Date Noted   Gout 02/27/2022   Acute renal failure superimposed on stage 3b chronic kidney disease ([place]) 11/16/2021   Uremia 11/16/2021   Gout due to renal impairment 10/17/2021   Pancytopenia ([place]) 08/05/2021   SIRS (systemic inflammatory response syndrome) ([place]) 08/04/2021   Chronic kidney disease, stage 3b ([place]) 12/30/2020   Multiple falls    Spinal stenosis    S/P TKR (total knee replacement), bilateral    Arthralgia 11/20/2019   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   Lesion of liver 04/20/2019   Secondary hypercoagulable state ([place]) 04/09/2019   Eosinophil count raised 02/17/2019   Acute tracheobronchitis 01/05/2019   Asthma    Vitamin D deficiency 07/25/2018   Chronic anticoagulation 03/06/2018   Hyperlipidemia associated with type 2 diabetes mellitus ([place]) 01/28/2018   Basal cell carcinoma (BCC) of left side of nose 01/28/2018   Infected prosthetic knee joint ([place])  06/24/2017   B12 deficiency 10/22/2016   Overweight (BMI 25.0-29.9) 10/22/2016   Primary osteoarthritis of right knee 03/29/2015   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   Paroxysmal A-fib ([place]) 04/16/2013   Chronic diastolic (congestive) heart failure ([place]) 04/16/2013   Colon polyps 04/16/2013   NIDDM-2 with polyneuropathy and hyperglycemia 04/16/2013   Tinea cruris 04/16/2013   Diabetic neuropathy ([place]) 07/14/2012   Insomnia 03/04/2012   Hypertensive heart disease with heart failure ([place]) 01/06/2010   Neoplasm of prostate, malignant ([place]) 01/06/2010   Hypercholesterolemia 01/06/2010   Essential hypertension 01/06/2010    PCP: Isaac Bliss, Olam Idler   REFERRING PROVIDER: Reece Agar, MD   REFERRING DIAG: [phone] (ICD-10-CM) - Spinal stenosis, lumbar region with neurogenic claudication   Rationale for Evaluation and Treatment Rehabilitation  THERAPY DIAG:  No diagnosis found.  ONSET DATE: 11/15/2021   SUBJECTIVE:  SUBJECTIVE STATEMENT: Pt saw cardiologist last Friday and was informed his rhythm looks good but has some extra beats.  He is wearing a heart monitor which he has to mail in on Friday.  He states he has good days and bad days relating to his neuropathy.  His neuropathy is really bothering him.  He states he has to take it slow.  Pt states he had no issues or increased pain after his prior fall.  He reports compliance with HEP.  Pt states his function is about the same since prior Rx.  Pt states his legs are stronger overall and able to keep him up.     PERTINENT HISTORY:  Lumbar anterolisthesis, Peripheral neuropathy, TKR bilat, A-fib, CHF, DM  PAIN:  Are you having pain? Yes: NPRS scale: Current:  3/10, Best:  1/10, Worst:  6/10 Pain location: central lumbar  Pain description: dull,  achy Aggravating factors: none  Relieving factors: injections   FOTO: INITIAL/CURRENT:  59/63 ; Goal of 64 at visit #10    PRECAUTIONS: Fall and Other: severe neuropathy   WEIGHT BEARING RESTRICTIONS No  FALLS:  Has patient fallen in last 6 months? Yes. Number of falls 3.5 (caught himself on one fall, on the other 3 falls my legs just gave way like someone took my legs out from under me)  LIVING ENVIRONMENT: Lives with: lives alone and family is near by and can help him easily  Lives in: House/apartment Stairs:  two story townhouse with stair lift inside, no STE but does have to deal with one step going up to chair lift Has following equipment at home: Single point cane, Walker - 4 wheeled, and stair lift, raised toilet seats, shower stool   OCCUPATION: retired   PLOF: Independent, Independent with basic ADLs, and Requires assistive device for independence  PATIENT GOALS stop falling, try to gain some strength   OBJECTIVE:   DIAGNOSTIC FINDINGS:  IMPRESSION: 1. 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 2. Stable to slight progression of severe spinal stenosis and moderate neural foraminal stenosis at L2-3. 3. Slight progression of moderate spinal stenosis at L3-4.    TODAY'S TREATMENT   Physical Performance testing:  Reviewed current function, pain levels, and HEP compliance for progress toward goals and progress note.    Pt continues to have limited bilat ankle DF AROM: R/L: DF Strength: R: tolerates slight resistance ; L:  Unable to tolerate resistance    LOWER EXTREMITY MMT:     MMT Right 11/7 Left 11/7 Right 12/14 Left 12/14  Hip flexion 4/5 4/5 4/5 4/5  Hip extension        Hip abduction 4-/5 3+/5 4-/5 4-/5  Hip adduction        Hip internal rotation        Hip external rotation        Knee flexion        Knee extension 4/5  4+/5 4+/5 5/5  Ankle dorsiflexion <3/5, unable to tolerate resistance 3+/5 Unable to tolerate resistance Unable to  tolerate resistance  Ankle plantarflexion        Ankle inversion Unable to tolerate resistance 3+/5 4-/5 4+/5  Ankle eversion Unable to tolerate resistance Unable to tolerate resistance 4+/5 4-/5   (Blank rows = not tested)      FUNCTIONAL TESTS:  3 minute walk test:  Prior:  550 ft with rollator          Current:  346 ft with SPC.  Pt only ambulated 2  mins due to having a fall.  Gait:  Pt ambulated with SPC with gait belt.  Pt demonstrates improved gait speed.  Pt does have bilat foot slap.  Pt had good stability during the walking test until he fell.  Pt ambulates in the clinic with rollator with good Wb'ing thru bilat LE's with good stability.    FOTO:  79 which has not changed from prior.  Goal of 64 at visit #10     Therapeutic Exercise:   Nustep at L5 with bilat UE/LE's x 6 mins Manually Resisted ankle DF iso's with 5 sec hold 2x5 bilat Seated marching with GTB 2x10  seated toe raises 2x10 Standing heel raises 2x10 Step ups on 4 inch step with UE on rail x 10 reps bilat  Lateral band walks with RTB around thighs with UE's on rail x 3 laps Step to gait over 3 hurdles with CGA with UE support   PATIENT EDUCATION:  Education details:  exercise form, exercise rationale, and POC.  PT instructed pt to call MD if he has any pain later.    Person educated: Patient Education method: Explanation, demonstration, verbal and tactile cues Education comprehension: verbalized understanding, returned demonstration, and verbal and visual cues required   HOME EXERCISE PROGRAM: Access Code: [email or web address] URL: [email or web address] Date: 12/01/2021 Prepared by: Ronny Flurry  Exercises - Supine Transversus Abdominis Bracing - Hands on Stomach  - 2 x daily - 7 x weekly - 2 sets - 10 reps - Supine March  - 1-2 x daily - 7 x weekly - 2 sets - 5-10 reps - Seated Hip Abduction with Resistance  - 1 x daily - 4 x weekly - 2 sets - 10 reps - Seated Knee Extension with Resistance  - 1  x daily - 3-4 x weekly - 2 sets - 10 reps - Seated March  - 1 x daily - 3-4 x weekly - 2 sets - 10 reps - Seated Heel Raise  - 1 x daily - 7 x weekly - 2 sets - 10 reps  Updated HEP: - Seated Toe Raise  - 1 x daily - 7 x weekly - 2 sets - 10 reps  ASSESSMENT:  CLINICAL IMPRESSION: Pt is progressing well with function, balance, and mobility as evidenced by subjective reports.  He reports improved ambulation and balance with his daily mobility.  Pt feels more steady with ambulation and denied having any falls prior to today.  He is ambulating into a grocery store without AD and uses the cart in the grocery store.  Pt has increased ambulation distance without AD.  Pt is standing to take a shower now.  Pt continues to have difficulty with performing stairs though is improving.  Pt continues to have weakness in bilat hips and ankles though is improving.  Pt has improved with gait though still has bilat foot slap.  He continues to have significant weakness in bilat ant tibialis and decreased bilat ankle DF AROM.  Pt's back has been feeling good since he had his injection.  He denies lumbar pain currently.   Pt was unable to complete 3 MWT due to falling.  Pt was ambulating with good speed with SPC.  Pt was wearing a gait belt and PT walked beside pt the entire test.  He ambulated 346 ft in 2 mins and fell forward to the floor at the 2 minute mark.  PT grabbed the gait belt, but was unable to prevent the fall.  PT assessed pt and  he states he feels fine and has no pain.  PT assisted pt with standing up and he walked back to the clinic.  PT had pt rest.  Pt states he felt fine and had no pain anywhere.  Safety Zone Portal form completed.  PT instructed pt to call MD if he has any pain later.   PT modified goals.  He is making slow progress toward goals though has made improve functionally.  Pt states he felt fine and had no pain anywhere at the end of Rx.  Pt should benefit from cont skilled PT services to  address ongoing goals, maximize functional mobility, and to improve safety with mobility.   Pt has been absent from PT due to clinic schedule availability.  continues to have weakness in and limitations with ankle DF ROM.  He has greater deficits in R ankle DF AROM than L and greater deficits in L ankle DF strength than R.  He is unable to actively DF either ankle to neutral.       OBJECTIVE IMPAIRMENTS Abnormal gait, cardiopulmonary status limiting activity, decreased balance, decreased coordination, decreased knowledge of use of DME, decreased mobility, difficulty walking, decreased strength, decreased safety awareness, increased edema, and impaired UE functional use.   ACTIVITY LIMITATIONS standing, squatting, stairs, transfers, bed mobility, reach over head, and locomotion level  PARTICIPATION LIMITATIONS: shopping, community activity, and yard work  PERSONAL FACTORS Age, Behavior pattern, Fitness, Past/current experiences, and Time since onset of injury/illness/exacerbation are also affecting patient's functional outcome.   REHAB POTENTIAL: Good  CLINICAL DECISION MAKING: Evolving/moderate complexity  EVALUATION COMPLEXITY: Moderate   GOALS: Goals reviewed with patient? Yes  SHORT TERM GOALS: Target date: 01/03/2022  Will be compliant with appropriate progressive HEP  Baseline:  Modified goal Goal status:  ONGOING  2.  Will be able to name 3 ways to reduce fall risk at home and in community  Baseline:  Goal status: PARTIALLY MET  3.  Will demonstrate improved ankle dorsiflexor endurance and reduce related fall risk as evidenced by elimination of foot slap during 3MWT  Baseline:  Goal status: NOT MET  4. Will be independent with alternative ways to control edema in BLEs (such as spiral wrapping with ACE bandages) Baseline:  Goal status: Discontinue goal.  Pt states swelling has been good since medication adjustment.   LONG TERM GOALS: Target date: 03/15/2022   MMT to  improve by at least 1 grade in all weak groups  Baseline:  Goal status:  PROGRESSING  2.  Will be able to ambulate at least 544f safely in 3MWT with use of SPC vs SBQC with no more than S level assist to improve community access  Baseline:  Goal status: HAS MET WITH ROLLATOR BUT NOT MET WITH CANE  3.  Will score at least 37 on the [place] to show improved balance/reduced fall risk  Baseline:  Goal status:60% MET  4.  Will demonstrate improved UE strength as evidenced by ability to lift rollator in/out of car for improved community access (for extended gait distances 10055f+) Baseline:  Goal status: GOAL MET  5.  Will be compliant with advanced HEP vs gym program to maintain functional gains and prevent recurrence of impairments  Baseline:  Goal status: ongoing     PLAN: PT FREQUENCY: 2x/week  PT DURATION: 4-5 weeks  PLANNED INTERVENTIONS: Therapeutic exercises, Therapeutic activity, Neuromuscular re-education, Balance training, Gait training, Patient/Family education, Self Care, Joint mobilization, Stair training, DME instructions, Aquatic Therapy, Taping, Ionotophoresis '4mg'$ /ml Dexamethasone, Manual  therapy, and Re-evaluation.  PLAN FOR NEXT SESSION: Cont to progress strength, fxnl endurance, and balance. Strengthen bilat hips, quad, and ankle dorsiflexors.  Pt Assess DF ROM.  may need to consider AFOs in the future if gait/ankle strength does not improve significantly with PT.   PN completed.    Selinda Michaels III PT, DPT 03/06/22 2:00 PM

## 2022-03-08 ENCOUNTER — Encounter (HOSPITAL_BASED_OUTPATIENT_CLINIC_OR_DEPARTMENT_OTHER): Payer: Medicare Other | Admitting: Physical Therapy

## 2022-03-08 ENCOUNTER — Telehealth: Payer: Self-pay | Admitting: Cardiology

## 2022-03-08 NOTE — Telephone Encounter (Signed)
Called patient and informed him that Dr. Bettina Gavia had not released the labs as of yet. I explained that when he releases the labs I will call him with the results. Patient was agreeable with this plan and had no further questions at this time.

## 2022-03-08 NOTE — Telephone Encounter (Signed)
Calling in bout his lab results. Please advise

## 2022-03-09 LAB — FLECAINIDE LEVEL: Flecainide: 0.79 ug/ml (ref 0.20–1.00)

## 2022-03-12 ENCOUNTER — Other Ambulatory Visit: Payer: Self-pay | Admitting: Internal Medicine

## 2022-03-13 ENCOUNTER — Encounter (HOSPITAL_BASED_OUTPATIENT_CLINIC_OR_DEPARTMENT_OTHER): Payer: Self-pay | Admitting: Physical Therapy

## 2022-03-13 ENCOUNTER — Telehealth: Payer: Self-pay | Admitting: Cardiology

## 2022-03-13 ENCOUNTER — Ambulatory Visit (HOSPITAL_BASED_OUTPATIENT_CLINIC_OR_DEPARTMENT_OTHER): Payer: Medicare Other | Admitting: Physical Therapy

## 2022-03-13 DIAGNOSIS — R2681 Unsteadiness on feet: Secondary | ICD-10-CM | POA: Diagnosis not present

## 2022-03-13 DIAGNOSIS — R296 Repeated falls: Secondary | ICD-10-CM

## 2022-03-13 DIAGNOSIS — M5459 Other low back pain: Secondary | ICD-10-CM

## 2022-03-13 DIAGNOSIS — M6281 Muscle weakness (generalized): Secondary | ICD-10-CM

## 2022-03-13 NOTE — Telephone Encounter (Signed)
Patient is calling back to receive results

## 2022-03-13 NOTE — Therapy (Signed)
OUTPATIENT PHYSICAL THERAPY TREATMENT NOTE         Patient Name: Charles Lloyd. MRN: [id number] DOB:[birth year], 82 y.o., male Today's Date: 03/14/2022   PT End of Session - 03/13/22 1322     Visit Number 18    Number of Visits 25    Date for PT Re-Evaluation 03/15/22    Authorization Type [employer] MCR    PT Start Time 1302    PT Stop Time 1342    PT Time Calculation (min) 40 min    Activity Tolerance Patient tolerated treatment well    Behavior During Therapy WFL for tasks assessed/performed                   Past Medical History:  Diagnosis Date   Acute tracheobronchitis 01/05/2019   B12 deficiency    Basal cell carcinoma (BCC) of left side of nose 01/28/2018   Cancer ([place])    Cardiomyopathy ([place]) 03/10/2018   With chronic atrial fibrillation   CHF (congestive heart failure) ([place]) 04/16/2013   Functional class II, ejection fraction 35-40%  Formatting of this note might be different from the original. Functional class II, ejection fraction 35-40%   Chronic anticoagulation    Chronic diastolic (congestive) heart failure ([place]) 04/16/2013   Functional class II, ejection fraction 35-40%  Last Assessment & Plan:  Clinically stable Volume well controlled meds reviewed   Chronic diastolic CHF (congestive heart failure) ([place]) 04/16/2013   Functional class II, ejection fraction 35-40%  Last Assessment & Plan:  Clinically stable Volume well controlled meds reviewed   Colon polyps 04/16/2013   Diabetic neuropathy (HCC)    Eosinophil count raised 02/17/2019   Essential hypertension 01/06/2010   Last Assessment & Plan:  Well controlled Continue med management   Gout    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  Steroid usage was discontinued several months ago.  Follow up with Dermatology as planned.   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   Last Assessment & Plan:  Id following ?ongoing abx managementy reported Request records   Infection of prosthetic right knee joint ([place])    Insomnia 03/04/2012   Grief with loss of wife 02/20/12  Last Assessment & Plan:  Improved sx  contineu xanax prn Se discussed   Lesion of liver 04/20/2019   -On cardiac CT 02/2019   Mild reactive airways disease    Neoplasm of prostate 04/16/2013   Neoplasm of prostate, malignant ([place]) 01/06/2010   [name] S/p radiation therapy   Last Assessment & Plan:  Repeat psa today No urinary sx Pt reported fatigue/poor appetite   On amiodarone therapy 09/07/2013   On continuous oral anticoagulation 01/28/2018   Other activity(E029.9) 04/20/2013   Formatting of this note might be different from the original. Transthoracic Echocardiogram-03/10/2013-[facility]: Normal left ventricular wall thickness and cavity size.  Global left ventricular systolic function is moderately reduced.  The estimated ejection fraction is 35-40%.  The left atrium is moderately enlarged.  The right atrium is mildly enlarged.  No significant valvular    Overweight (BMI 25.0-29.9) 10/22/2016   Persistent atrial fibrillation ([place]) 04/16/2013  Last Assessment & Plan:  Rate controlled Continue med management eliquis for stroke preventino  Formatting of this note might be different from the original.  Drug  HX Current Rx Pre-ABL inefficacy Pre-ABL intolerant Post-ABL inefficacy Post-ABL intolerant max dose/24h M/Y end comments  sotalol                  dofetilide                  flecainide                  propafenone                  am   S/P TKR (total knee replacement), bilateral    Secondary hypercoagulable state ([place])  04/09/2019   Tinea cruris 04/16/2013   Type 2 diabetes mellitus with diabetic polyneuropathy, without long-term current use of insulin ([place]) 04/16/2013   Last Assessment & Plan:  Labs today Pt reports well controlled on ambulatory monitoring   Vitamin D deficiency 07/25/2018   Past Surgical History:  Procedure Laterality Date   ATRIAL FIBRILLATION ABLATION N/A 03/12/2019   Procedure: ATRIAL FIBRILLATION ABLATION;  Surgeon: [name], MD;  Location: [facility];  Service: Cardiovascular;  Laterality: N/A;   ATRIAL FIBRILLATION ABLATION N/A 04/29/2020   Procedure: ATRIAL FIBRILLATION ABLATION;  Surgeon: [name], MD;  Location: [facility];  Service: Cardiovascular;  Laterality: N/A;   CARDIAC ELECTROPHYSIOLOGY STUDY AND ABLATION     CARDIOVERSION     CARDIOVERSION N/A 10/10/2018   Procedure: CARDIOVERSION;  Surgeon: [name], MD;  Location: [facility] ENDOSCOPY;  Service: Cardiovascular;  Laterality: N/A;   CARDIOVERSION N/A 05/18/2020   Procedure: CARDIOVERSION;  Surgeon: [name], MD;  Location: [facility] ENDOSCOPY;  Service: Cardiovascular;  Laterality: N/A;   CARDIOVERSION N/A 12/09/2020   Procedure: CARDIOVERSION;  Surgeon: Lelon Perla, MD;  Location: [facility] ENDOSCOPY;  Service: Cardiovascular;  Laterality: N/A;   CHOLECYSTECTOMY     PROSTATECTOMY     REPLACEMENT TOTAL KNEE BILATERAL     Patient Active Problem List   Diagnosis Date Noted   Gout 02/27/2022   Acute renal failure superimposed on stage 3b chronic kidney disease ([place]) 11/16/2021   Uremia 11/16/2021   Gout due to renal impairment 10/17/2021   Pancytopenia ([place]) 08/05/2021   SIRS (systemic inflammatory response syndrome) ([place]) 08/04/2021   Chronic kidney disease, stage 3b ([place]) 12/30/2020   Multiple falls    Spinal stenosis    S/P TKR (total knee replacement), bilateral    Arthralgia 11/20/2019   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   Lesion of liver 04/20/2019   Secondary hypercoagulable  state ([place]) 04/09/2019   Eosinophil count raised 02/17/2019   Acute tracheobronchitis 01/05/2019   Asthma    Vitamin D deficiency 07/25/2018   Chronic anticoagulation 03/06/2018   Hyperlipidemia associated with type 2 diabetes mellitus ([place]) 01/28/2018   Basal cell carcinoma (BCC) of left side of nose 01/28/2018   Infected prosthetic knee joint ([place]) 06/24/2017   B12 deficiency 10/22/2016   Overweight (BMI 25.0-29.9) 10/22/2016   Primary osteoarthritis of right knee 03/29/2015   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   Paroxysmal A-fib ([place]) 04/16/2013   Chronic diastolic (congestive) heart failure ([place]) 04/16/2013   Colon polyps 04/16/2013   NIDDM-2 with polyneuropathy and hyperglycemia 04/16/2013   Tinea cruris 04/16/2013   Diabetic neuropathy ([place]) 07/14/2012   Insomnia 03/04/2012   Hypertensive heart disease with heart failure ([place]) 01/06/2010   Neoplasm  of prostate, malignant ([place]) 01/06/2010   Hypercholesterolemia 01/06/2010   Essential hypertension 01/06/2010    PCP: Isaac Bliss, Olam Idler   REFERRING PROVIDER: Reece Agar, MD   REFERRING DIAG: [phone] (ICD-10-CM) - Spinal stenosis, lumbar region with neurogenic claudication   Rationale for Evaluation and Treatment Rehabilitation  THERAPY DIAG:  Unsteadiness on feet  Repeated falls  Muscle weakness (generalized)  Other low back pain  ONSET DATE: 11/15/2021   SUBJECTIVE:                                                                                                                                                                                           SUBJECTIVE STATEMENT: Pt states his feet are really bothering him.  He's not having pain anywhere else.  He states his neuropathy comes and goes, but is bothering him today.  Pt reports improved strength in bilat LE's.  Pt has had no recent falls.  Pt reports improved ability to correct his balance if he feels wobbly.  Pt states he changed his shoes, but it doesn't  help.      PERTINENT HISTORY:  Lumbar anterolisthesis, Peripheral neuropathy, TKR bilat, A-fib, CHF, DM  PAIN:  Pt denies lumbar pain currently. Pt reports 5/10 pain in bilat ankles and feet due to neuropathy.    PRECAUTIONS: Fall and Other: severe neuropathy   WEIGHT BEARING RESTRICTIONS No  FALLS:  Has patient fallen in last 6 months? Yes. Number of falls 3.5 (caught himself on one fall, on the other 3 falls my legs just gave way like someone took my legs out from under me)  LIVING ENVIRONMENT: Lives with: lives alone and family is near by and can help him easily  Lives in: House/apartment Stairs:  two story townhouse with stair lift inside, no STE but does have to deal with one step going up to chair lift Has following equipment at home: Single point cane, Walker - 4 wheeled, and stair lift, raised toilet seats, shower stool   OCCUPATION: retired   PLOF: Independent, Independent with basic ADLs, and Requires assistive device for independence  PATIENT GOALS stop falling, try to gain some strength   OBJECTIVE:   DIAGNOSTIC FINDINGS:  IMPRESSION: 1. 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 2. Stable to slight progression of severe spinal stenosis and moderate neural foraminal stenosis at L2-3. 3. Slight progression of moderate spinal stenosis at L3-4.    TODAY'S TREATMENT    Therapeutic Exercise:   Reviewed pt presentation, pain levels, and HEP compliance.    Nustep at L5 with bilat UE/LE's x 6 mins Manually Resisted ankle DF iso's with 5 sec hold  2x10 bilat Seated marching with GTB 2x10  seated toe raises 2x10 Standing heel raises 2x10  Lateral band walks with RTB around thighs with UE's on rail 2 sets of 2 laps  PT performed CGA with all airex activities. Standing on Airex with FA x 30 sec, FT 2x30 sec Marching on airex with and without UE support   Therapeutic Activities: Step ups on 4 inch step with UE on rail 2 x 10 reps bilat Step to gait  and reciprocal gait over 4 hurdles with CGA with UE support on rail Lateral stepping over 4 hurdles with CGA with UE support on rail   PATIENT EDUCATION:  Education details:  exercise form, exercise rationale, and POC.  PT instructed pt to call MD if he has any pain later.    Person educated: Patient Education method: Explanation, demonstration, verbal and tactile cues Education comprehension: verbalized understanding, returned demonstration, and verbal and visual cues required   HOME EXERCISE PROGRAM: Access Code: [email or web address] URL: [email or web address] Date: 12/01/2021 Prepared by: Ronny Flurry   ASSESSMENT:  CLINICAL IMPRESSION: Pt is improving with balance and stability though has neuropathy which intermittently bothers him.  He continues to have weakness in and limitations with ankle DF ROM.  Pt gives good effort with all exercises.  He performed exercises well with cuing for correct form.  PT added stepping over hurdles to improve foot clearance.  Pt's foot did touch some of the hurdles though much improved clearance today and was able to perform reciprocal gait.  He does require UE support on rail with walking over hurdles.  He responded well to Rx and reports LE fatigue after Rx.  Pt should benefit from cont skilled PT services to address ongoing goals, maximize functional mobility, and to improve safety with mobility.    OBJECTIVE IMPAIRMENTS Abnormal gait, cardiopulmonary status limiting activity, decreased balance, decreased coordination, decreased knowledge of use of DME, decreased mobility, difficulty walking, decreased strength, decreased safety awareness, increased edema, and impaired UE functional use.   ACTIVITY LIMITATIONS standing, squatting, stairs, transfers, bed mobility, reach over head, and locomotion level  PARTICIPATION LIMITATIONS: shopping, community activity, and yard work  PERSONAL FACTORS Age, Behavior pattern, Fitness, Past/current experiences,  and Time since onset of injury/illness/exacerbation are also affecting patient's functional outcome.   REHAB POTENTIAL: Good  CLINICAL DECISION MAKING: Evolving/moderate complexity  EVALUATION COMPLEXITY: Moderate   GOALS: Goals reviewed with patient? Yes  SHORT TERM GOALS: Target date: 01/03/2022  Will be compliant with appropriate progressive HEP  Baseline:  Modified goal Goal status:  ONGOING  2.  Will be able to name 3 ways to reduce fall risk at home and in community  Baseline:  Goal status: PARTIALLY MET  3.  Will demonstrate improved ankle dorsiflexor endurance and reduce related fall risk as evidenced by elimination of foot slap during 3MWT  Baseline:  Goal status: NOT MET  4. Will be independent with alternative ways to control edema in BLEs (such as spiral wrapping with ACE bandages) Baseline:  Goal status: Discontinue goal.  Pt states swelling has been good since medication adjustment.   LONG TERM GOALS: Target date: 03/15/2022   MMT to improve by at least 1 grade in all weak groups  Baseline:  Goal status:  PROGRESSING  2.  Will be able to ambulate at least 544f safely in 3MWT with use of SPC vs SBQC with no more than S level assist to improve community access  Baseline:  Goal status: HAS MET WITH ROLLATOR BUT NOT MET  WITH CANE  3.  Will score at least 37 on the [place] to show improved balance/reduced fall risk  Baseline:  Goal status:60% MET  4.  Will demonstrate improved UE strength as evidenced by ability to lift rollator in/out of car for improved community access (for extended gait distances 1022f +) Baseline:  Goal status: GOAL MET  5.  Will be compliant with advanced HEP vs gym program to maintain functional gains and prevent recurrence of impairments  Baseline:  Goal status: ongoing     PLAN: PT FREQUENCY: 2x/week  PT DURATION: 4-5 weeks  PLANNED INTERVENTIONS: Therapeutic exercises, Therapeutic activity, Neuromuscular re-education,  Balance training, Gait training, Patient/Family education, Self Care, Joint mobilization, Stair training, DME instructions, Aquatic Therapy, Taping, Ionotophoresis '4mg'$ /ml Dexamethasone, Manual therapy, and Re-evaluation.  PLAN FOR NEXT SESSION: Cont to progress strength, fxnl endurance, and balance. Strengthen bilat hips, quad, and ankle dorsiflexors.  Assess DF ROM.  may need to consider AFOs in the future if gait/ankle strength does not improve significantly with PT.      RSelinda MichaelsIII PT, DPT 03/14/22 5:06 PM

## 2022-03-14 NOTE — Telephone Encounter (Signed)
Patient informed of results.  

## 2022-03-15 ENCOUNTER — Ambulatory Visit (HOSPITAL_BASED_OUTPATIENT_CLINIC_OR_DEPARTMENT_OTHER): Payer: Medicare Other | Admitting: Physical Therapy

## 2022-03-15 DIAGNOSIS — R296 Repeated falls: Secondary | ICD-10-CM

## 2022-03-15 DIAGNOSIS — R2681 Unsteadiness on feet: Secondary | ICD-10-CM | POA: Diagnosis not present

## 2022-03-15 DIAGNOSIS — M5459 Other low back pain: Secondary | ICD-10-CM

## 2022-03-15 DIAGNOSIS — M6281 Muscle weakness (generalized): Secondary | ICD-10-CM

## 2022-03-15 NOTE — Therapy (Signed)
OUTPATIENT PHYSICAL THERAPY TREATMENT NOTE         Patient Name: Charles Lloyd. MRN: [id number] DOB:Jan 23, 1941, 82 y.o., male Today's Date: 03/16/2022   PT End of Session - 03/15/22 1406     Visit Number 19    Number of Visits 25    Date for PT Re-Evaluation 03/15/22    Authorization Type [employer] MCR    PT Start Time 6213    PT Stop Time 1436    PT Time Calculation (min) 41 min    Activity Tolerance Patient tolerated treatment well    Behavior During Therapy WFL for tasks assessed/performed                   Past Medical History:  Diagnosis Date   Acute tracheobronchitis 01/05/2019   B12 deficiency    Basal cell carcinoma (BCC) of left side of nose 01/28/2018   Cancer ([place])    Cardiomyopathy ([place]) 03/10/2018   With chronic atrial fibrillation   CHF (congestive heart failure) ([place]) 04/16/2013   Functional class II, ejection fraction 35-40%  Formatting of this note might be different from the original. Functional class II, ejection fraction 35-40%   Chronic anticoagulation    Chronic diastolic (congestive) heart failure ([place]) 04/16/2013   Functional class II, ejection fraction 35-40%  Last Assessment & Plan:  Clinically stable Volume well controlled meds reviewed   Chronic diastolic CHF (congestive heart failure) ([place]) 04/16/2013   Functional class II, ejection fraction 35-40%  Last Assessment & Plan:  Clinically stable Volume well controlled meds reviewed   Colon polyps 04/16/2013   Diabetic neuropathy (HCC)    Eosinophil count raised 02/17/2019   Essential hypertension 01/06/2010   Last Assessment & Plan:  Well controlled Continue med management   Gout    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  Steroid usage was discontinued several months ago.  Follow up with Dermatology as planned.   Hypercholesterolemia 01/06/2010   Last Assessment & Plan:  Repeat labs recommended   Hyperlipidemia associated  with type 2 diabetes mellitus ([place]) 01/28/2018   Hypertensive heart disease with heart failure ([place]) 01/06/2010   Last Assessment & Plan:  Well controlled Continue med management   Hyponatremia 12/17/2018   Infected prosthetic knee joint ([place]) 06/24/2017   Last Assessment & Plan:  Id following ?ongoing abx managementy reported Request records   Infection of prosthetic right knee joint ([place])    Insomnia 03/04/2012   Grief with loss of wife 02/20/12  Last Assessment & Plan:  Improved sx  contineu xanax prn Se discussed   Lesion of liver 04/20/2019   -On cardiac CT 02/2019   Mild reactive airways disease    Neoplasm of prostate 04/16/2013   Neoplasm of prostate, malignant ([place]) 01/06/2010   [name] S/p radiation therapy   Last Assessment & Plan:  Repeat psa today No urinary sx Pt reported fatigue/poor appetite   On amiodarone therapy 09/07/2013   On continuous oral anticoagulation 01/28/2018   Other activity(E029.9) 04/20/2013   Formatting of this note might be different from the original. Transthoracic Echocardiogram-03/10/2013-[facility]: Normal left ventricular wall thickness and cavity size.  Global left ventricular systolic function is moderately reduced.  The estimated ejection fraction is 35-40%.  The left atrium is moderately enlarged.  The right atrium is mildly enlarged.  No significant valvular    Overweight (BMI 25.0-29.9) 10/22/2016   Persistent atrial fibrillation ([place]) 04/16/2013  Last Assessment & Plan:  Rate controlled Continue med management eliquis for stroke preventino  Formatting of this note might be different from the original.  Drug  HX Current Rx Pre-ABL inefficacy Pre-ABL intolerant Post-ABL inefficacy Post-ABL intolerant max dose/24h M/Y end comments  sotalol                  dofetilide                  flecainide                  propafenone                  am   S/P TKR (total knee replacement), bilateral    Secondary hypercoagulable state ([place])  04/09/2019   Tinea cruris 04/16/2013   Type 2 diabetes mellitus with diabetic polyneuropathy, without long-term current use of insulin ([place]) 04/16/2013   Last Assessment & Plan:  Labs today Pt reports well controlled on ambulatory monitoring   Vitamin D deficiency 07/25/2018   Past Surgical History:  Procedure Laterality Date   ATRIAL FIBRILLATION ABLATION N/A 03/12/2019   Procedure: ATRIAL FIBRILLATION ABLATION;  Surgeon: [name], MD;  Location: [place] [facility];  Service: Cardiovascular;  Laterality: N/A;   ATRIAL FIBRILLATION ABLATION N/A 04/29/2020   Procedure: ATRIAL FIBRILLATION ABLATION;  Surgeon: [name], MD;  Location: [place] [facility];  Service: Cardiovascular;  Laterality: N/A;   CARDIAC ELECTROPHYSIOLOGY STUDY AND ABLATION     CARDIOVERSION     CARDIOVERSION N/A 10/10/2018   Procedure: CARDIOVERSION;  Surgeon: [name], MD;  Location: [facility] ENDOSCOPY;  Service: Cardiovascular;  Laterality: N/A;   CARDIOVERSION N/A 05/18/2020   Procedure: CARDIOVERSION;  Surgeon: [name], MD;  Location: [facility] ENDOSCOPY;  Service: Cardiovascular;  Laterality: N/A;   CARDIOVERSION N/A 12/09/2020   Procedure: CARDIOVERSION;  Surgeon: Lelon Perla, MD;  Location: [facility] ENDOSCOPY;  Service: Cardiovascular;  Laterality: N/A;   CHOLECYSTECTOMY     PROSTATECTOMY     REPLACEMENT TOTAL KNEE BILATERAL     Patient Active Problem List   Diagnosis Date Noted   Gout 02/27/2022   Acute renal failure superimposed on stage 3b chronic kidney disease ([place]) 11/16/2021   Uremia 11/16/2021   Gout due to renal impairment 10/17/2021   Pancytopenia ([place]) 08/05/2021   SIRS (systemic inflammatory response syndrome) ([place]) 08/04/2021   Chronic kidney disease, stage 3b ([place]) 12/30/2020   Multiple falls    Spinal stenosis    S/P TKR (total knee replacement), bilateral    Arthralgia 11/20/2019   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   Lesion of liver 04/20/2019   Secondary hypercoagulable  state ([place]) 04/09/2019   Eosinophil count raised 02/17/2019   Acute tracheobronchitis 01/05/2019   Asthma    Vitamin D deficiency 07/25/2018   Chronic anticoagulation 03/06/2018   Hyperlipidemia associated with type 2 diabetes mellitus ([place]) 01/28/2018   Basal cell carcinoma (BCC) of left side of nose 01/28/2018   Infected prosthetic knee joint ([place]) 06/24/2017   B12 deficiency 10/22/2016   Overweight (BMI 25.0-29.9) 10/22/2016   Primary osteoarthritis of right knee 03/29/2015   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   Paroxysmal A-fib ([place]) 04/16/2013   Chronic diastolic (congestive) heart failure ([place]) 04/16/2013   Colon polyps 04/16/2013   NIDDM-2 with polyneuropathy and hyperglycemia 04/16/2013   Tinea cruris 04/16/2013   Diabetic neuropathy ([place]) 07/14/2012   Insomnia 03/04/2012   Hypertensive heart disease with heart failure ([place]) 01/06/2010   Neoplasm  of prostate, malignant ([place]) 01/06/2010   Hypercholesterolemia 01/06/2010   Essential hypertension 01/06/2010    PCP: Isaac Bliss, [facility] Idler   REFERRING PROVIDER: Reece Agar, MD   REFERRING DIAG: [phone] (ICD-10-CM) - Spinal stenosis, lumbar region with neurogenic claudication   Rationale for Evaluation and Treatment Rehabilitation  THERAPY DIAG:  Unsteadiness on feet  Repeated falls  Muscle weakness (generalized)  Other low back pain  ONSET DATE: 11/15/2021   SUBJECTIVE:                                                                                                                                                                                           SUBJECTIVE STATEMENT: Pt states he was fatigued after prior Rx though had no increased pain.  Pt states his neuropathy is bothering him from his knees down to this feet.  It's always there, but some days it is 10 or 20 times worse.  Pt reports he has a catch in his back today.  Pt reports improved strength in bilat LE's.  Pt has had no recent falls.  Pt reports  improved ability to correct his balance if he feels wobbly.  Pt states he changed his shoes, but it doesn't help.  Pt reports he has difficulty with performing stairs.  He was unable to step up a curb without UE support.  He had to hold on to his car to step up a curb.       PERTINENT HISTORY:  Lumbar anterolisthesis, Peripheral neuropathy, TKR bilat, A-fib, CHF, DM  PAIN:  Pt 2/10 central lumbar pain currently.   PRECAUTIONS: Fall and Other: severe neuropathy   WEIGHT BEARING RESTRICTIONS No  FALLS:  Has patient fallen in last 6 months? Yes. Number of falls 3.5 (caught himself on one fall, on the other 3 falls my legs just gave way like someone took my legs out from under me)  LIVING ENVIRONMENT: Lives with: lives alone and family is near by and can help him easily  Lives in: House/apartment Stairs:  two story townhouse with stair lift inside, no STE but does have to deal with one step going up to chair lift Has following equipment at home: Single point cane, Walker - 4 wheeled, and stair lift, raised toilet seats, shower stool   OCCUPATION: retired   PLOF: Independent, Independent with basic ADLs, and Requires assistive device for independence  PATIENT GOALS stop falling, try to gain some strength   OBJECTIVE:   DIAGNOSTIC FINDINGS:  IMPRESSION: 1. 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 2. Stable to slight progression of severe spinal stenosis and moderate neural foraminal stenosis at L2-3. 3.  Slight progression of moderate spinal stenosis at L3-4.    TODAY'S TREATMENT    Therapeutic Exercise:   Reviewed pt presentation, response to prior Rx, pain levels, and HEP compliance.    Nustep at L4 with bilat UE/LE's x 5 mins Manually Resisted ankle DF iso's with 5 sec hold 2x10 bilat Seated marching with GTB 2x15  seated toe raises 2x10 Standing heel raises 2x10  Lateral band walks with RTB around thighs with UE's on rail 2 sets of 2 laps Marching on airex  2x10   Therapeutic Activities: Step ups on 4 inch step with UE on rail 2 x 10 reps bilat Step to gait and reciprocal gait over 4 hurdles with CGA with UE support on rail Lateral stepping over 4 hurdles with CGA with UE support on rail Mini squats with TrA with UE support on rail   PATIENT EDUCATION:  Education details:  exercise form, exercise rationale, and POC.  Person educated: Patient Education method: Explanation, demonstration, verbal and tactile cues Education comprehension: verbalized understanding, returned demonstration, and verbal and visual cues required   HOME EXERCISE PROGRAM: Access Code: [email or web address] URL: [email or web address] Date: 12/01/2021 Prepared by: Ronny Flurry   ASSESSMENT:  CLINICAL IMPRESSION: Pt is improving with balance and stability.  He continues to have weakness in and limitations with ankle DF ROM.  Pt gives good effort with all exercises.  He performed exercises well with cuing for correct form.  Pt performed walking/stepping over hurdles with CGA and UE support to improve foot clearance and balance.  Pt has difficulty with squatting form and requires much cuing and instruction for correct form.  He responded well to Rx and had no c/o's after Rx.  Pt should benefit from cont skilled PT services to address ongoing goals, maximize functional mobility, and to improve safety with mobility.    OBJECTIVE IMPAIRMENTS Abnormal gait, cardiopulmonary status limiting activity, decreased balance, decreased coordination, decreased knowledge of use of DME, decreased mobility, difficulty walking, decreased strength, decreased safety awareness, increased edema, and impaired UE functional use.   ACTIVITY LIMITATIONS standing, squatting, stairs, transfers, bed mobility, reach over head, and locomotion level  PARTICIPATION LIMITATIONS: shopping, community activity, and yard work  PERSONAL FACTORS Age, Behavior pattern, Fitness, Past/current experiences,  and Time since onset of injury/illness/exacerbation are also affecting patient's functional outcome.   REHAB POTENTIAL: Good  CLINICAL DECISION MAKING: Evolving/moderate complexity  EVALUATION COMPLEXITY: Moderate   GOALS: Goals reviewed with patient? Yes  SHORT TERM GOALS: Target date: 01/03/2022  Will be compliant with appropriate progressive HEP  Baseline:  Modified goal Goal status:  ONGOING  2.  Will be able to name 3 ways to reduce fall risk at home and in community  Baseline:  Goal status: PARTIALLY MET  3.  Will demonstrate improved ankle dorsiflexor endurance and reduce related fall risk as evidenced by elimination of foot slap during 3MWT  Baseline:  Goal status: NOT MET  4. Will be independent with alternative ways to control edema in BLEs (such as spiral wrapping with ACE bandages) Baseline:  Goal status: Discontinue goal.  Pt states swelling has been good since medication adjustment.   LONG TERM GOALS: Target date: 03/15/2022   MMT to improve by at least 1 grade in all weak groups  Baseline:  Goal status:  PROGRESSING  2.  Will be able to ambulate at least 541f safely in 3MWT with use of SPC vs SBQC with no more than S level assist to improve community access  Baseline:  Goal status:  HAS MET WITH ROLLATOR BUT NOT MET WITH CANE  3.  Will score at least 37 on the [place] to show improved balance/reduced fall risk  Baseline:  Goal status:60% MET  4.  Will demonstrate improved UE strength as evidenced by ability to lift rollator in/out of car for improved community access (for extended gait distances 1051f +) Baseline:  Goal status: GOAL MET  5.  Will be compliant with advanced HEP vs gym program to maintain functional gains and prevent recurrence of impairments  Baseline:  Goal status: ongoing     PLAN: PT FREQUENCY: 2x/week  PT DURATION: 4-5 weeks  PLANNED INTERVENTIONS: Therapeutic exercises, Therapeutic activity, Neuromuscular re-education,  Balance training, Gait training, Patient/Family education, Self Care, Joint mobilization, Stair training, DME instructions, Aquatic Therapy, Taping, Ionotophoresis '4mg'$ /ml Dexamethasone, Manual therapy, and Re-evaluation.  PLAN FOR NEXT SESSION: Cont to progress strength, fxnl endurance, and balance. Strengthen bilat hips, quad, and ankle dorsiflexors.  Assess DF ROM.  may need to consider AFOs in the future if gait/ankle strength does not improve significantly with PT.  Recert next visit.   RSelinda MichaelsIII PT, DPT 03/16/22 4:32 PM

## 2022-03-16 ENCOUNTER — Encounter (HOSPITAL_BASED_OUTPATIENT_CLINIC_OR_DEPARTMENT_OTHER): Payer: Self-pay | Admitting: Physical Therapy

## 2022-03-20 ENCOUNTER — Ambulatory Visit (HOSPITAL_BASED_OUTPATIENT_CLINIC_OR_DEPARTMENT_OTHER): Payer: Medicare Other | Admitting: Physical Therapy

## 2022-03-20 ENCOUNTER — Encounter (HOSPITAL_BASED_OUTPATIENT_CLINIC_OR_DEPARTMENT_OTHER): Payer: Self-pay | Admitting: Physical Therapy

## 2022-03-20 DIAGNOSIS — R2681 Unsteadiness on feet: Secondary | ICD-10-CM | POA: Diagnosis not present

## 2022-03-20 DIAGNOSIS — M5459 Other low back pain: Secondary | ICD-10-CM

## 2022-03-20 DIAGNOSIS — M6281 Muscle weakness (generalized): Secondary | ICD-10-CM

## 2022-03-20 DIAGNOSIS — R296 Repeated falls: Secondary | ICD-10-CM

## 2022-03-20 NOTE — Therapy (Signed)
OUTPATIENT PHYSICAL THERAPY TREATMENT NOTE         Patient Name: Charles Lloyd. MRN: [id number] DOB:[birth year], 82 y.o., male Today's Date: 03/21/2022   PT End of Session - 03/20/22 1309     Visit Number 20    Number of Visits 28    Date for PT Re-Evaluation 04/17/22    Authorization Type [employer] MCR    PT Start Time 8315    PT Stop Time 1345    PT Time Calculation (min) 40 min    Activity Tolerance Patient tolerated treatment well    Behavior During Therapy WFL for tasks assessed/performed                    Past Medical History:  Diagnosis Date   Acute tracheobronchitis 01/05/2019   B12 deficiency    Basal cell carcinoma (BCC) of left side of nose 01/28/2018   Cancer ([place])    Cardiomyopathy ([place]) 03/10/2018   With chronic atrial fibrillation   CHF (congestive heart failure) ([place]) 04/16/2013   Functional class II, ejection fraction 35-40%  Formatting of this note might be different from the original. Functional class II, ejection fraction 35-40%   Chronic anticoagulation    Chronic diastolic (congestive) heart failure ([place]) 04/16/2013   Functional class II, ejection fraction 35-40%  Last Assessment & Plan:  Clinically stable Volume well controlled meds reviewed   Chronic diastolic CHF (congestive heart failure) ([place]) 04/16/2013   Functional class II, ejection fraction 35-40%  Last Assessment & Plan:  Clinically stable Volume well controlled meds reviewed   Colon polyps 04/16/2013   Diabetic neuropathy (HCC)    Eosinophil count raised 02/17/2019   Essential hypertension 01/06/2010   Last Assessment & Plan:  Well controlled Continue med management   Gout    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  Steroid usage was discontinued several months ago.  Follow up with Dermatology as planned.   Hypercholesterolemia 01/06/2010   Last Assessment & Plan:  Repeat labs recommended   Hyperlipidemia associated  with type 2 diabetes mellitus ([place]) 01/28/2018   Hypertensive heart disease with heart failure ([place]) 01/06/2010   Last Assessment & Plan:  Well controlled Continue med management   Hyponatremia 12/17/2018   Infected prosthetic knee joint ([place]) 06/24/2017   Last Assessment & Plan:  Id following ?ongoing abx managementy reported Request records   Infection of prosthetic right knee joint ([place])    Insomnia 03/04/2012   Grief with loss of wife 02/20/12  Last Assessment & Plan:  Improved sx  contineu xanax prn Se discussed   Lesion of liver 04/20/2019   -On cardiac CT 02/2019   Mild reactive airways disease    Neoplasm of prostate 04/16/2013   Neoplasm of prostate, malignant ([place]) 01/06/2010   [name] S/p radiation therapy   Last Assessment & Plan:  Repeat psa today No urinary sx Pt reported fatigue/poor appetite   On amiodarone therapy 09/07/2013   On continuous oral anticoagulation 01/28/2018   Other activity(E029.9) 04/20/2013   Formatting of this note might be different from the original. Transthoracic Echocardiogram-03/10/[facility]: Normal left ventricular wall thickness and cavity size.  Global left ventricular systolic function is moderately reduced.  The estimated ejection fraction is 35-40%.  The left atrium is moderately enlarged.  The right atrium is mildly enlarged.  No significant valvular    Overweight (BMI 25.0-29.9) 10/22/2016   Persistent atrial fibrillation ([place]) 04/16/2013  Last Assessment & Plan:  Rate controlled Continue med management eliquis for stroke preventino  Formatting of this note might be different from the original.  Drug  HX Current Rx Pre-ABL inefficacy Pre-ABL intolerant Post-ABL inefficacy Post-ABL intolerant max dose/24h M/Y end comments  sotalol                  dofetilide                  flecainide                  propafenone                  am   S/P TKR (total knee replacement), bilateral    Secondary hypercoagulable state ([place])  04/09/2019   Tinea cruris 04/16/2013   Type 2 diabetes mellitus with diabetic polyneuropathy, without long-term current use of insulin ([place]) 04/16/2013   Last Assessment & Plan:  Labs today Pt reports well controlled on ambulatory monitoring   Vitamin D deficiency 07/25/2018   Past Surgical History:  Procedure Laterality Date   ATRIAL FIBRILLATION ABLATION N/A 03/12/2019   Procedure: ATRIAL FIBRILLATION ABLATION;  Surgeon: [name], MD;  Location: [facility];  Service: Cardiovascular;  Laterality: N/A;   ATRIAL FIBRILLATION ABLATION N/A 04/29/2020   Procedure: ATRIAL FIBRILLATION ABLATION;  Surgeon: [name], MD;  Location: [facility];  Service: Cardiovascular;  Laterality: N/A;   CARDIAC ELECTROPHYSIOLOGY STUDY AND ABLATION     CARDIOVERSION     CARDIOVERSION N/A 10/10/2018   Procedure: CARDIOVERSION;  Surgeon: [name], MD;  Location: [facility] ENDOSCOPY;  Service: Cardiovascular;  Laterality: N/A;   CARDIOVERSION N/A 05/18/2020   Procedure: CARDIOVERSION;  Surgeon: [name], MD;  Location[facility] ENDOSCOPY;  Service: Cardiovascular;  Laterality: N/A;   CARDIOVERSION N/A 12/09/2020   Procedure: CARDIOVERSION;  Surgeon: Lelon Perla, MD;  Location: [facility] ENDOSCOPY;  Service: Cardiovascular;  Laterality: N/A;   CHOLECYSTECTOMY     PROSTATECTOMY     REPLACEMENT TOTAL KNEE BILATERAL     Patient Active Problem List   Diagnosis Date Noted   Gout 02/27/2022   Acute renal failure superimposed on stage 3b chronic kidney disease ([place]) 11/16/2021   Uremia 11/16/2021   Gout due to renal impairment 10/17/2021   Pancytopenia ([place]) 08/05/2021   SIRS (systemic inflammatory response syndrome) ([place]) 08/04/2021   Chronic kidney disease, stage 3b ([place]) 12/30/2020   Multiple falls    Spinal stenosis    S/P TKR (total knee replacement), bilateral    Arthralgia 11/20/2019   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   Lesion of liver 04/20/2019   Secondary hypercoagulable  state ([place]) 04/09/2019   Eosinophil count raised 02/17/2019   Acute tracheobronchitis 01/05/2019   Asthma    Vitamin D deficiency 07/25/2018   Chronic anticoagulation 03/06/2018   Hyperlipidemia associated with type 2 diabetes mellitus ([place]) 01/28/2018   Basal cell carcinoma (BCC) of left side of nose 01/28/2018   Infected prosthetic knee joint ([address]) 06/24/2017   B12 deficiency 10/22/2016   Overweight (BMI 25.0-29.9) 10/22/2016   Primary osteoarthritis of right knee 03/29/2015   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   Paroxysmal A-fib ([place]) 04/16/2013   Chronic diastolic (congestive) heart failure ([place]) 04/16/2013   Colon polyps 04/16/2013   NIDDM-2 with polyneuropathy and hyperglycemia 04/16/2013   Tinea cruris 04/16/2013   Diabetic neuropathy ([place]) 07/14/2012   Insomnia 03/04/2012   Hypertensive heart disease with heart failure ([place]) 01/06/2010   Neoplasm  of prostate, malignant ([place]) 01/06/2010   Hypercholesterolemia 01/06/2010   Essential hypertension 01/06/2010    PCP: Isaac Bliss, [facility] Idler   REFERRING PROVIDER: Reece Agar, MD   REFERRING DIAG: [phone] (ICD-10-CM) - Spinal stenosis, lumbar region with neurogenic claudication   Rationale for Evaluation and Treatment Rehabilitation  THERAPY DIAG:  Unsteadiness on feet  Repeated falls  Muscle weakness (generalized)  Other low back pain  ONSET DATE: 11/15/2021   SUBJECTIVE:                                                                                                                                                                                           SUBJECTIVE STATEMENT: Pt denies any adverse effects after prior Rx.  Pt reports improved LE strength and balance.  He denies having any falls recently.  Pt reports improved ability to correct his balance if he feels wobbly.  Pt states he is limited with ambulating extended community distance.  Pt has difficulty with stairs.  He had to hold on to his car to step up a  curb.  Pt states his back is bothering him.  He states his last injection was about 2 months ago.  Pt states he ambulates in home without any AD.  Pt states his ankles are swollen.  Pt states his cardiologist increased his fluid pill by 1/2 a pill.     PERTINENT HISTORY:  Lumbar anterolisthesis, Peripheral neuropathy, TKR bilat, A-fib, CHF, DM  PAIN:  NPRS:  Current:  5/10, Worst:  5/10, Best:  0/10 Location:  central lumbar pain    PRECAUTIONS: Fall and Other: severe neuropathy   WEIGHT BEARING RESTRICTIONS No  FALLS:  Has patient fallen in last 6 months? Yes. Number of falls 3.5 (caught himself on one fall, on the other 3 falls my legs just gave way like someone took my legs out from under me)  LIVING ENVIRONMENT: Lives with: lives alone and family is near by and can help him easily  Lives in: House/apartment Stairs:  two story townhouse with stair lift inside, no STE but does have to deal with one step going up to chair lift Has following equipment at home: Single point cane, Walker - 4 wheeled, and stair lift, raised toilet seats, shower stool   OCCUPATION: retired   PLOF: Independent, Independent with basic ADLs, and Requires assistive device for independence  PATIENT GOALS stop falling, try to gain some strength   OBJECTIVE:   DIAGNOSTIC FINDINGS:  IMPRESSION: 1. 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 2. Stable to slight progression of severe spinal stenosis and moderate neural foraminal stenosis at L2-3. 3. Slight progression  of moderate spinal stenosis at L3-4.    TODAY'S TREATMENT  PHYSICAL PERFORMANCE TESTING:  -Reviewed current function, response to prior Rx, pain levels, and HEP compliance.  LOWER EXTREMITY MMT:     MMT Right 11/7 Left 11/7 Right 12/14 Left 12/14 Right 1/23 Left 1/23  Hip flexion 4/5 4/5 4/5 4/5 4/5 4+/5  Hip extension            Hip abduction 4-/5 3+/5 4-/5 4-/5 4-/5 4-/5  Hip adduction            Hip internal  rotation            Hip external rotation            Knee flexion         4+/5 seated 5/5 seated  Knee extension 4/5  4+/5 4+/5 5/5 5/5 5/5  Ankle dorsiflexion <3/5, unable to tolerate resistance 3+/5 Unable to tolerate resistance Unable to tolerate resistance Tolerated slight resistance Unable to tolerate any resistance  Ankle plantarflexion            Ankle inversion Unable to tolerate resistance 3+/5 4-/5 4+/5    Ankle eversion Unable to tolerate resistance Unable to tolerate resistance 4+/5 4-/5     (Blank rows = not tested)Therapeutic Exercise:     -DF AROM:  Lacking 12-14 deg/lacking 10 deg from neutral in R/L ankle   FUNCTIONAL TESTS:  3 minute walk test:  Prior:  550 ft with rollator           Current: 483 with rollator  Patient Surveys: FOTO:  INITIAL/PRIOR/CURRENT: 59/63/59 ; Goal of 64 at visit #10     Pt performed: seated toe raises 2x10 Lateral band walks with RTB around thighs with UE's on rail  2 laps Step to gait and reciprocal gait over 4 hurdles with CGA with UE support on rail     PATIENT EDUCATION:  Education details:  exercise form, exercise rationale, and POC.  Person educated: Patient Education method: Explanation, demonstration, verbal and tactile cues Education comprehension: verbalized understanding, returned demonstration, and verbal and visual cues required   HOME EXERCISE PROGRAM: Access Code: [email or web address] URL: [email or web address] Date: 12/01/2021 Prepared by: Ronny Flurry   ASSESSMENT:  CLINICAL IMPRESSION: A progress note was completed on 12/14 and pt has only been seen for 3 visits since due to the clinic's availability with scheduling.  Pt presents to Rx today with his back bothering him.   Pt is progressing with strength, balance, and mobility.  He reports improved LE strength and stability with his daily mobility.  Pt states he is limited with ambulating extended community distance and has difficulty with stairs.  Pt has  neuropathy which affects his balance.  He has significant weakness in bilat DF and is unable to achieve neutral DF AROM bilat.  Pt continues to have weakness in bilat LE's though does demonstrate minimal improvement in select areas.  Pt had decreased ambulation distance on 6 MWT with rollator.  Pt has improved tolerance with exercises as evidenced by performance and progression of more challenging exercises.  Pt has limited progress toward goals though also has only received 3 PT visits since last PN.   Pt should benefit from cont skilled PT services to address ongoing goals, maximize functional mobility, and to improve safety with mobility.    OBJECTIVE IMPAIRMENTS Abnormal gait, cardiopulmonary status limiting activity, decreased balance, decreased coordination, decreased knowledge of use of DME, decreased mobility, difficulty walking, decreased strength, decreased safety awareness, increased edema,  and impaired UE functional use.   ACTIVITY LIMITATIONS standing, squatting, stairs, transfers, bed mobility, reach over head, and locomotion level  PARTICIPATION LIMITATIONS: shopping, community activity, and yard work  PERSONAL FACTORS Age, Behavior pattern, Fitness, Past/current experiences, and Time since onset of injury/illness/exacerbation are also affecting patient's functional outcome.   REHAB POTENTIAL: Good  CLINICAL DECISION MAKING: Evolving/moderate complexity  EVALUATION COMPLEXITY: Moderate   GOALS: Goals reviewed with patient? Yes  SHORT TERM GOALS: Target date: 01/03/2022  Will be compliant with appropriate progressive HEP  Baseline:  Modified goal Goal status:  ONGOING  2.  Will be able to name 3 ways to reduce fall risk at home and in community  Baseline:  Goal status: Not assessed  3.  Will demonstrate improved ankle dorsiflexor endurance and reduce related fall risk as evidenced by elimination of foot slap during 3MWT  Baseline:  Goal status: NOT MET    LONG TERM  GOALS: Target date: 04/17/2022   MMT to improve by at least 1 grade in all weak groups  Baseline:  Goal status:  PROGRESSING  2.  Will be able to ambulate at least 52f safely in 3MWT with use of SPC vs SBQC with no more than S level assist to improve community access  Baseline:  Goal status: NOT MET  3.  Will score at least 37 on the Berg to show improved balance/reduced fall risk  Baseline:  Goal status:  60% MET; NOT ASSESSED TODAY  4.  Will demonstrate improved UE strength as evidenced by ability to lift rollator in/out of car for improved community access (for extended gait distances 10091f+) Baseline:  Goal status: GOAL MET  5.  Will be compliant with advanced HEP vs gym program to maintain functional gains and prevent recurrence of impairments  Baseline:  Goal status: ongoing     PLAN: PT FREQUENCY: 2x/week  PT DURATION: 4 weeks  PLANNED INTERVENTIONS: Therapeutic exercises, Therapeutic activity, Neuromuscular re-education, Balance training, Gait training, Patient/Family education, Self Care, Joint mobilization, Stair training, DME instructions, Aquatic Therapy, Taping, Ionotophoresis '4mg'$ /ml Dexamethasone, Manual therapy, and Re-evaluation.  PLAN FOR NEXT SESSION: Cont to progress strength, fxnl endurance, and balance. Strengthen bilat hips, quad, and ankle dorsiflexors.  Assess DF ROM.  may need to consider AFOs in the future if gait/ankle strength does not improve significantly with PT.  Recert completed.  Berg balance test next visit.   RoSelinda MichaelsII PT, DPT 03/21/22 5:20 PM

## 2022-03-22 ENCOUNTER — Ambulatory Visit (HOSPITAL_BASED_OUTPATIENT_CLINIC_OR_DEPARTMENT_OTHER): Payer: Medicare Other | Admitting: Physical Therapy

## 2022-03-22 DIAGNOSIS — R296 Repeated falls: Secondary | ICD-10-CM

## 2022-03-22 DIAGNOSIS — M6281 Muscle weakness (generalized): Secondary | ICD-10-CM

## 2022-03-22 DIAGNOSIS — R2681 Unsteadiness on feet: Secondary | ICD-10-CM | POA: Diagnosis not present

## 2022-03-22 DIAGNOSIS — M5459 Other low back pain: Secondary | ICD-10-CM

## 2022-03-22 NOTE — Therapy (Signed)
OUTPATIENT PHYSICAL THERAPY TREATMENT NOTE         Patient Name: Charles Lloyd. MRN: [id number] DOB:1940-04-20, 82 y.o., male Today's Date: 03/23/2022   PT End of Session - 03/23/22 1234     Visit Number 21    Number of Visits 28    Date for PT Re-Evaluation 04/17/22    Authorization Type [employer] MCR    PT Start Time 2979    PT Stop Time 1434    PT Time Calculation (min) 39 min    Activity Tolerance Patient tolerated treatment well    Behavior During Therapy WFL for tasks assessed/performed                     Past Medical History:  Diagnosis Date   Acute tracheobronchitis 01/05/2019   B12 deficiency    Basal cell carcinoma (BCC) of left side of nose 01/28/2018   Cancer ([place])    Cardiomyopathy ([place]) 03/10/2018   With chronic atrial fibrillation   CHF (congestive heart failure) ([place]) 04/16/2013   Functional class II, ejection fraction 35-40%  Formatting of this note might be different from the original. Functional class II, ejection fraction 35-40%   Chronic anticoagulation    Chronic diastolic (congestive) heart failure ([place]) 04/16/2013   Functional class II, ejection fraction 35-40%  Last Assessment & Plan:  Clinically stable Volume well controlled meds reviewed   Chronic diastolic CHF (congestive heart failure) ([place]) 04/16/2013   Functional class II, ejection fraction 35-40%  Last Assessment & Plan:  Clinically stable Volume well controlled meds reviewed   Colon polyps 04/16/2013   Diabetic neuropathy (H[facility])    Eosinophil count raised 02/17/2019   Essential hypertension 01/06/2010   Last Assessment & Plan:  Well controlled Continue med management   Gout    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  Steroid usage was discontinued several months ago.  Follow up with Dermatology as planned.   Hypercholesterolemia 01/06/2010   Last Assessment & Plan:  Repeat labs recommended   Hyperlipidemia  associated with type 2 diabetes mellitus ([place]) 01/28/2018   Hypertensive heart disease with heart failure ([place]) 01/06/2010   Last Assessment & Plan:  Well controlled Continue med management   Hyponatremia 12/17/2018   Infected prosthetic knee joint ([place]) 06/24/2017   Last Assessment & Plan:  Id following ?ongoing abx managementy reported Request records   Infection of prosthetic right knee joint ([place])    Insomnia 03/04/2012   Grief with loss of wife 02/20/12  Last Assessment & Plan:  Improved sx  contineu xanax prn Se discussed   Lesion of liver 04/20/2019   -On cardiac CT 02/2019   Mild reactive airways disease    Neoplasm of prostate 04/16/2013   Neoplasm of prostate, malignant ([place]) 01/06/2010   [name] S/p radiation therapy   Last Assessment & Plan:  Repeat psa today No urinary sx Pt reported fatigue/poor appetite   On amiodarone therapy 09/07/2013   On continuous oral anticoagulation 01/28/2018   Other activity(E029.9) 04/20/2013   Formatting of this note might be different from the original. Transthoracic Echocardiogram-03/10/2013-[facility]: Normal left ventricular wall thickness and cavity size.  Global left ventricular systolic function is moderately reduced.  The estimated ejection fraction is 35-40%.  The left atrium is moderately enlarged.  The right atrium is mildly enlarged.  No significant valvular    Overweight (BMI 25.0-29.9) 10/22/2016   Persistent atrial fibrillation ([place]) 04/16/2013  Last Assessment & Plan:  Rate controlled Continue med management eliquis for stroke preventino  Formatting of this note might be different from the original.  Drug  HX Current Rx Pre-ABL inefficacy Pre-ABL intolerant Post-ABL inefficacy Post-ABL intolerant max dose/24h M/Y end comments  sotalol                  dofetilide                  flecainide                  propafenone                  am   S/P TKR (total knee replacement), bilateral    Secondary hypercoagulable state  ([place]) 04/09/2019   Tinea cruris 04/16/2013   Type 2 diabetes mellitus with diabetic polyneuropathy, without long-term current use of insulin ([place]) 04/16/2013   Last Assessment & Plan:  Labs today Pt reports well controlled on ambulatory monitoring   Vitamin D deficiency 07/25/2018   Past Surgical History:  Procedure Laterality Date   ATRIAL FIBRILLATION ABLATION N/A 03/12/2019   Procedure: ATRIAL FIBRILLATION ABLATION;  Surgeon: [name], MD;  Location: [facility];  Service: Cardiovascular;  Laterality: N/A;   ATRIAL FIBRILLATION ABLATION N/A 04/29/2020   Procedure: ATRIAL FIBRILLATION ABLATION;  Surgeon: [name], MD;  Location: [facility];  Service: Cardiovascular;  Laterality: N/A;   CARDIAC ELECTROPHYSIOLOGY STUDY AND ABLATION     CARDIOVERSION     CARDIOVERSION N/A 10/10/2018   Procedure: CARDIOVERSION;  Surgeon: [name], MD;  Location: [facility] ENDOSCOPY;  Service: Cardiovascular;  Laterality: N/A;   CARDIOVERSION N/A 05/18/2020   Procedure: CARDIOVERSION;  Surgeon: [name], MD;  Location: [facility] ENDOSCOPY;  Service: Cardiovascular;  Laterality: N/A;   CARDIOVERSION N/A 12/09/2020   Procedure: CARDIOVERSION;  Surgeon: Lelon Perla, MD;  Location: [facility] ENDOSCOPY;  Service: Cardiovascular;  Laterality: N/A;   CHOLECYSTECTOMY     PROSTATECTOMY     REPLACEMENT TOTAL KNEE BILATERAL     Patient Active Problem List   Diagnosis Date Noted   Gout 02/27/2022   Acute renal failure superimposed on stage 3b chronic kidney disease ([place]) 11/16/2021   Uremia 11/16/2021   Gout due to renal impairment 10/17/2021   Pancytopenia ([place]) 08/05/2021   SIRS (systemic inflammatory response syndrome) ([place]) 08/04/2021   Chronic kidney disease, stage 3b ([place]) 12/30/2020   Multiple falls    Spinal stenosis    S/P TKR (total knee replacement), bilateral    Arthralgia 11/20/2019   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   Lesion of liver 04/20/2019   Secondary  hypercoagulable state ([place]) 04/09/2019   Eosinophil count raised 02/17/2019   Acute tracheobronchitis 01/05/2019   Asthma    Vitamin D deficiency 07/25/2018   Chronic anticoagulation 03/06/2018   Hyperlipidemia associated with type 2 diabetes mellitus ([place]) 01/28/2018   Basal cell carcinoma (BCC) of left side of nose 01/28/2018   Infected prosthetic knee joint ([place]) 06/24/2017   B12 deficiency 10/22/2016   Overweight (BMI 25.0-29.9) 10/22/2016   Primary osteoarthritis of right knee 03/29/2015   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   Paroxysmal A-fib ([place]) 04/16/2013   Chronic diastolic (congestive) heart failure ([place]) 04/16/2013   Colon polyps 04/16/2013   NIDDM-2 with polyneuropathy and hyperglycemia 04/16/2013   Tinea cruris 04/16/2013   Diabetic neuropathy ([place]) 07/14/2012   Insomnia 03/04/2012   Hypertensive heart disease with heart failure ([place]) 01/06/2010   Neoplasm  of prostate, malignant ([place]) 01/06/2010   Hypercholesterolemia 01/06/2010   Essential hypertension 01/06/2010    PCP: Isaac Bliss, [place] Idler   REFERRING PROVIDER: Reece Agar, MD   REFERRING DIAG: [id number] (ICD-10-CM) - Spinal stenosis, lumbar region with neurogenic claudication   Rationale for Evaluation and Treatment Rehabilitation  THERAPY DIAG:  Unsteadiness on feet  Repeated falls  Muscle weakness (generalized)  Other low back pain  ONSET DATE: 11/15/2021   SUBJECTIVE:                                                                                                                                                                                           SUBJECTIVE STATEMENT: Pt states he felt good after prior Rx.  "When I leave here, I usually feel good.  I feel limbered up."  Pt states MD increased Gabapentin dosage and his neuropathy is much better.  He has much improved sensation in bilat feet.  Pt reports his back is feeling better.      PERTINENT HISTORY:  Lumbar anterolisthesis, Peripheral  neuropathy, TKR bilat, A-fib, CHF, DM  PAIN:  NPRS:  Current:  2/10, Worst:  5/10, Best:  0/10 Location:  central lumbar pain    PRECAUTIONS: Fall and Other: severe neuropathy   WEIGHT BEARING RESTRICTIONS No  FALLS:  Has patient fallen in last 6 months? Yes. Number of falls 3.5 (caught himself on one fall, on the other 3 falls my legs just gave way like someone took my legs out from under me)  LIVING ENVIRONMENT: Lives with: lives alone and family is near by and can help him easily  Lives in: House/apartment Stairs:  two story townhouse with stair lift inside, no STE but does have to deal with one step going up to chair lift Has following equipment at home: Single point cane, Walker - 4 wheeled, and stair lift, raised toilet seats, shower stool   OCCUPATION: retired   PLOF: Independent, Independent with basic ADLs, and Requires assistive device for independence  PATIENT GOALS stop falling, try to gain some strength   OBJECTIVE:   DIAGNOSTIC FINDINGS:  IMPRESSION: 1. 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 2. Stable to slight progression of severe spinal stenosis and moderate neural foraminal stenosis at L2-3. 3. Slight progression of moderate spinal stenosis at L3-4.    TODAY'S TREATMENT    Therapeutic Exercise:  Nustep L4 x 6 mins UE/LE's seated toe raises 2x10 Standing heel raises 2x10 Lateral band walks with RTB around thighs with UE's on rail x 3 laps  Therapeutic Activities: Step ups on 4 inch step with UE on rail x 10 reps bilat Step  to gait and reciprocal gait over 4 hurdles with CGA with UE support on rail Lateral stepping over 4 hurdles with CGA with UE support on rail  Neuro Re-ed Activities: Standing on Airex x 30 seconds each FA and FT Standing in a staggered stance x30 sec bilat without UE support without LOB Tandem stance 2x30 sec with CGA and occasional UE support    PATIENT EDUCATION:  Education details:  exercise form, exercise  rationale, and POC.  Person educated: Patient Education method: Explanation, demonstration, verbal and tactile cues Education comprehension: verbalized understanding, returned demonstration, and verbal and visual cues required   HOME EXERCISE PROGRAM: Access Code: [email or web address] URL: [email or web address] Date: 12/01/2021 Prepared by: Ronny Flurry   ASSESSMENT:  CLINICAL IMPRESSION: Pt presents to Rx stating his back is feeling better.  His MD increased his gabapentin dosage and he states his neuropathy has been much better.  Pt reports improved sensation in feet since increasing Gabapentin.  Pt is improving with balance and stability as evidenced by performance of exercises.  He is improving with stepping over hurdles though does require UE support.  He is clearing the hurdles better with gait.  Pt has good stability with lateral band walks with holding on to rail for assist.  Pt continues to have limited ROM with seated toe raises.  Pt is motivated and gives good effort with all exercises.  Pt responded well to Rx having no c/o's after Rx.  Pt should benefit from cont skilled PT services to address ongoing goals, maximize functional mobility, and to improve safety with mobility.    OBJECTIVE IMPAIRMENTS Abnormal gait, cardiopulmonary status limiting activity, decreased balance, decreased coordination, decreased knowledge of use of DME, decreased mobility, difficulty walking, decreased strength, decreased safety awareness, increased edema, and impaired UE functional use.   ACTIVITY LIMITATIONS standing, squatting, stairs, transfers, bed mobility, reach over head, and locomotion level  PARTICIPATION LIMITATIONS: shopping, community activity, and yard work  PERSONAL FACTORS Age, Behavior pattern, Fitness, Past/current experiences, and Time since onset of injury/illness/exacerbation are also affecting patient's functional outcome.   REHAB POTENTIAL: Good  CLINICAL DECISION MAKING:  Evolving/moderate complexity  EVALUATION COMPLEXITY: Moderate   GOALS: Goals reviewed with patient? Yes  SHORT TERM GOALS: Target date: 01/03/2022  Will be compliant with appropriate progressive HEP  Baseline:  Modified goal Goal status:  ONGOING  2.  Will be able to name 3 ways to reduce fall risk at home and in community  Baseline:  Goal status: Not assessed  3.  Will demonstrate improved ankle dorsiflexor endurance and reduce related fall risk as evidenced by elimination of foot slap during 3MWT  Baseline:  Goal status: NOT MET    LONG TERM GOALS: Target date: 04/17/2022   MMT to improve by at least 1 grade in all weak groups  Baseline:  Goal status:  PROGRESSING  2.  Will be able to ambulate at least 544f safely in 3MWT with use of SPC vs SBQC with no more than S level assist to improve community access  Baseline:  Goal status: NOT MET  3.  Will score at least 37 on the Berg to show improved balance/reduced fall risk  Baseline:  Goal status:  60% MET; NOT ASSESSED TODAY  4.  Will demonstrate improved UE strength as evidenced by ability to lift rollator in/out of car for improved community access (for extended gait distances 10028f+) Baseline:  Goal status: GOAL MET  5.  Will be compliant with advanced HEP vs gym program to maintain  functional gains and prevent recurrence of impairments  Baseline:  Goal status: ongoing     PLAN: PT FREQUENCY: 2x/week  PT DURATION: 4 weeks  PLANNED INTERVENTIONS: Therapeutic exercises, Therapeutic activity, Neuromuscular re-education, Balance training, Gait training, Patient/Family education, Self Care, Joint mobilization, Stair training, DME instructions, Aquatic Therapy, Taping, Ionotophoresis '4mg'$ /ml Dexamethasone, Manual therapy, and Re-evaluation.  PLAN FOR NEXT SESSION: Cont to progress strength, fxnl endurance, and balance. Strengthen bilat hips, quad, and ankle dorsiflexors.  Assess DF ROM.  may need to consider AFOs  in the future if gait/ankle strength does not improve significantly with PT.  Recert completed.  Berg balance test next visit.   Selinda Michaels III PT, DPT 03/23/22 12:35 PM

## 2022-03-23 ENCOUNTER — Encounter (HOSPITAL_BASED_OUTPATIENT_CLINIC_OR_DEPARTMENT_OTHER): Payer: Self-pay | Admitting: Physical Therapy

## 2022-03-27 ENCOUNTER — Encounter (HOSPITAL_BASED_OUTPATIENT_CLINIC_OR_DEPARTMENT_OTHER): Payer: Self-pay | Admitting: Physical Therapy

## 2022-03-27 ENCOUNTER — Ambulatory Visit (HOSPITAL_BASED_OUTPATIENT_CLINIC_OR_DEPARTMENT_OTHER): Payer: Medicare Other | Admitting: Physical Therapy

## 2022-03-27 DIAGNOSIS — M6281 Muscle weakness (generalized): Secondary | ICD-10-CM

## 2022-03-27 DIAGNOSIS — R2681 Unsteadiness on feet: Secondary | ICD-10-CM

## 2022-03-27 DIAGNOSIS — R296 Repeated falls: Secondary | ICD-10-CM

## 2022-03-27 DIAGNOSIS — M5459 Other low back pain: Secondary | ICD-10-CM

## 2022-03-27 NOTE — Therapy (Signed)
OUTPATIENT PHYSICAL THERAPY TREATMENT NOTE         Patient Name: Charles Lloyd. MRN: [id number] DOB:02-05-1941, 82 y.o., male Today's Date: 03/28/2022   PT End of Session - 03/27/22 1111     Visit Number 22    Number of Visits 28    Date for PT Re-Evaluation 04/17/22    Authorization Type [employer] MCR    PT Start Time 1103    PT Stop Time 1144    PT Time Calculation (min) 41 min    Activity Tolerance Patient tolerated treatment well    Behavior During Therapy WFL for tasks assessed/performed                     Past Medical History:  Diagnosis Date   Acute tracheobronchitis 01/05/2019   B12 deficiency    Basal cell carcinoma (BCC) of left side of nose 01/28/2018   Cancer ([place])    Cardiomyopathy ([place]) 03/10/2018   With chronic atrial fibrillation   CHF (congestive heart failure) ([place]) 04/16/2013   Functional class II, ejection fraction 35-40%  Formatting of this note might be different from the original. Functional class II, ejection fraction 35-40%   Chronic anticoagulation    Chronic diastolic (congestive) heart failure ([place]) 04/16/2013   Functional class II, ejection fraction 35-40%  Last Assessment & Plan:  Clinically stable Volume well controlled meds reviewed   Chronic diastolic CHF (congestive heart failure) ([place]) 04/16/2013   Functional class II, ejection fraction 35-40%  Last Assessment & Plan:  Clinically stable Volume well controlled meds reviewed   Colon polyps 04/16/2013   Diabetic neuropathy (HCC)    Eosinophil count raised 02/17/2019   Essential hypertension 01/06/2010   Last Assessment & Plan:  Well controlled Continue med management   Gout    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  Steroid usage was discontinued several months ago.  Follow up with Dermatology as planned.   Hypercholesterolemia 01/06/2010   Last Assessment & Plan:  Repeat labs recommended   Hyperlipidemia  associated with type 2 diabetes mellitus ([place]) 01/28/2018   Hypertensive heart disease with heart failure ([place]) 01/06/2010   Last Assessment & Plan:  Well controlled Continue med management   Hyponatremia 12/17/2018   Infected prosthetic knee joint ([place]) 06/24/2017   Last Assessment & Plan:  Id following ?ongoing abx managementy reported Request records   Infection of prosthetic right knee joint ([place])    Insomnia 03/04/2012   Grief with loss of wife 02/20/12  Last Assessment & Plan:  Improved sx  contineu xanax prn Se discussed   Lesion of liver 04/20/2019   -On cardiac CT 02/2019   Mild reactive airways disease    Neoplasm of prostate 04/16/2013   Neoplasm of prostate, malignant ([place]) 01/06/2010   [name] S/p radiation therapy   Last Assessment & Plan:  Repeat psa today No urinary sx Pt reported fatigue/poor appetite   On amiodarone therapy 09/07/2013   On continuous oral anticoagulation 01/28/2018   Other activity(E029.9) 04/20/2013   Formatting of this note might be different from the original. Transthoracic Echocardiogram-03/10/2013-[facility]: Normal left ventricular wall thickness and cavity size.  Global left ventricular systolic function is moderately reduced.  The estimated ejection fraction is 35-40%.  The left atrium is moderately enlarged.  The right atrium is mildly enlarged.  No significant valvular    Overweight (BMI 25.0-29.9) 10/22/2016   Persistent atrial fibrillation ([place]) 04/16/2013  Last Assessment & Plan:  Rate controlled Continue med management eliquis for stroke preventino  Formatting of this note might be different from the original.  Drug  HX Current Rx Pre-ABL inefficacy Pre-ABL intolerant Post-ABL inefficacy Post-ABL intolerant max dose/24h M/Y end comments  sotalol                  dofetilide                  flecainide                  propafenone                  am   S/P TKR (total knee replacement), bilateral    Secondary hypercoagulable state  ([place]) 04/09/2019   Tinea cruris 04/16/2013   Type 2 diabetes mellitus with diabetic polyneuropathy, without long-term current use of insulin ([place]) 04/16/2013   Last Assessment & Plan:  Labs today Pt reports well controlled on ambulatory monitoring   Vitamin D deficiency 07/25/2018   Past Surgical History:  Procedure Laterality Date   ATRIAL FIBRILLATION ABLATION N/A 03/12/2019   Procedure: ATRIAL FIBRILLATION ABLATION;  Surgeon: [name], MD;  Location: [facility];  Service: Cardiovascular;  Laterality: N/A;   ATRIAL FIBRILLATION ABLATION N/A 04/29/2020   Procedure: ATRIAL FIBRILLATION ABLATION;  Surgeon: [name], MD;  Location: [facility];  Service: Cardiovascular;  Laterality: N/A;   CARDIAC ELECTROPHYSIOLOGY STUDY AND ABLATION     CARDIOVERSION     CARDIOVERSION N/A 10/10/2018   Procedure: CARDIOVERSION;  Surgeon: [name], MD;  Location: [facility] ENDOSCOPY;  Service: Cardiovascular;  Laterality: N/A;   CARDIOVERSION N/A 05/18/2020   Procedure: CARDIOVERSION;  Surgeon: [name], MD;  Location: [facility] ENDOSCOPY;  Service: Cardiovascular;  Laterality: N/A;   CARDIOVERSION N/A 12/09/2020   Procedure: CARDIOVERSION;  Surgeon: Lelon Perla, MD;  Location: [facility] ENDOSCOPY;  Service: Cardiovascular;  Laterality: N/A;   CHOLECYSTECTOMY     PROSTATECTOMY     REPLACEMENT TOTAL KNEE BILATERAL     Patient Active Problem List   Diagnosis Date Noted   Gout 02/27/2022   Acute renal failure superimposed on stage 3b chronic kidney disease ([place]) 11/16/2021   Uremia 11/16/2021   Gout due to renal impairment 10/17/2021   Pancytopenia ([place]) 08/05/2021   SIRS (systemic inflammatory response syndrome) ([place]) 08/04/2021   Chronic kidney disease, stage 3b ([place]) 12/30/2020   Multiple falls    Spinal stenosis    S/P TKR (total knee replacement), bilateral    Arthralgia 11/20/2019   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   Lesion of liver 04/20/2019   Secondary  hypercoagulable state ([place]) 04/09/2019   Eosinophil count raised 02/17/2019   Acute tracheobronchitis 01/05/2019   Asthma    Vitamin D deficiency 07/25/2018   Chronic anticoagulation 03/06/2018   Hyperlipidemia associated with type 2 diabetes mellitus ([place]) 01/28/2018   Basal cell carcinoma (BCC) of left side of nose 01/28/2018   Infected prosthetic knee joint ([place]) 06/24/2017   B12 deficiency 10/22/2016   Overweight (BMI 25.0-29.9) 10/22/2016   Primary osteoarthritis of right knee 03/29/2015   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   Paroxysmal A-fib ([place]) 04/16/2013   Chronic diastolic (congestive) heart failure ([place]) 04/16/2013   Colon polyps 04/16/2013   NIDDM-2 with polyneuropathy and hyperglycemia 04/16/2013   Tinea cruris 04/16/2013   Diabetic neuropathy ([place]) 07/14/2012   Insomnia 03/04/2012   Hypertensive heart disease with heart failure ([place]) 01/06/2010   Neoplasm  of prostate, malignant ([place]) 01/06/2010   Hypercholesterolemia 01/06/2010   Essential hypertension 01/06/2010    PCP: Isaac Bliss, [facility] Idler   REFERRING PROVIDER: Reece Agar, MD   REFERRING DIAG: [phone] (ICD-10-CM) - Spinal stenosis, lumbar region with neurogenic claudication   Rationale for Evaluation and Treatment Rehabilitation  THERAPY DIAG:  Unsteadiness on feet  Repeated falls  Muscle weakness (generalized)  Other low back pain  ONSET DATE: 11/15/2021   SUBJECTIVE:                                                                                                                                                                                           SUBJECTIVE STATEMENT: Pt denies any adverse effects prior Rx.  Pt states he typically feels good when he leaves PT.  Pt reports his back is feeling better.  Pt states he has been using Ibuprofen some which takes the edge off his back pain.  He used Ibuprofen this AM and has no pain currently.       PERTINENT HISTORY:  Lumbar anterolisthesis,  Peripheral neuropathy, TKR bilat, A-fib, CHF, DM  PAIN:  NPRS:  Current:  2/10, Worst:  5/10, Best:  0/10 Location:  central lumbar pain    PRECAUTIONS: Fall and Other: severe neuropathy   WEIGHT BEARING RESTRICTIONS No  FALLS:  Has patient fallen in last 6 months? Yes. Number of falls 3.5 (caught himself on one fall, on the other 3 falls my legs just gave way like someone took my legs out from under me)  LIVING ENVIRONMENT: Lives with: lives alone and family is near by and can help him easily  Lives in: House/apartment Stairs:  two story townhouse with stair lift inside, no STE but does have to deal with one step going up to chair lift Has following equipment at home: Single point cane, Walker - 4 wheeled, and stair lift, raised toilet seats, shower stool   OCCUPATION: retired   PLOF: Independent, Independent with basic ADLs, and Requires assistive device for independence  PATIENT GOALS stop falling, try to gain some strength   OBJECTIVE:   DIAGNOSTIC FINDINGS:  IMPRESSION: 1. 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 2. Stable to slight progression of severe spinal stenosis and moderate neural foraminal stenosis at L2-3. 3. Slight progression of moderate spinal stenosis at L3-4.    TODAY'S TREATMENT    Berg Balance Scale: Initial/Prior/Current:  27/56 / 33/56 / 34/56   BERG Balance Test          Date:   Sit to Stand 3  Standing unsupported 4  Sitting with back unsupported but feet supported 4  Stand  to sit  4  Transfers  3  Standing unsupported with eyes closed 2  Standing unsupported feet together 4  From standing position, reach forward with outstretched arm 3  From standing position, pick up object from floor 3  From standing position, turn and look behind over each shoulder 1  Turn 360 2  Standing unsupported, alternately place foot on step 0  Standing unsupported, one foot in front 1  Standing on one leg 0  Total:  34/56     Therapeutic  Exercise:  Nustep L4 x 5 mins UE/LE's Lateral band walks with RTB around thighs with UE's on rail x 3 laps  Therapeutic Activities: Step ups on 4 inch step with UE on rail x 10 reps bilat Step to gait and reciprocal gait over 4 hurdles with CGA with UE support on rail Stepping over 4 hurdles with step to and reciprocal gait with CGA with UE support on rail  Neuro Re-ed Activities: Berg Balance Assessment:  34/56    PATIENT EDUCATION:  Education details:  exercise form, exercise rationale, and POC.  Person educated: Patient Education method: Explanation, demonstration, verbal and tactile cues Education comprehension: verbalized understanding, returned demonstration, and verbal and visual cues required   HOME EXERCISE PROGRAM: Access Code: [email or web address] URL: [email or web address] Date: 12/01/2021 Prepared by: Ronny Flurry   ASSESSMENT:  CLINICAL IMPRESSION: Pt presents to Rx stating his back is feeling better.  Pt is improving with balance and strength as evidenced by subjective reports.  He demonstrates improved balance and stability with exercises.  He is improving with stepping over hurdles including having improved foot clearance though does require UE support.  PT performed Berg Balance Assessment today and he only improved by 1 point since November. Pt is motivated and gives good effort with all exercises.  Pt responded well to Rx having no c/o's after Rx.  Pt should benefit from cont skilled PT services to address ongoing goals, maximize functional mobility, and to improve safety with mobility.     OBJECTIVE IMPAIRMENTS Abnormal gait, cardiopulmonary status limiting activity, decreased balance, decreased coordination, decreased knowledge of use of DME, decreased mobility, difficulty walking, decreased strength, decreased safety awareness, increased edema, and impaired UE functional use.   ACTIVITY LIMITATIONS standing, squatting, stairs, transfers, bed mobility,  reach over head, and locomotion level  PARTICIPATION LIMITATIONS: shopping, community activity, and yard work  PERSONAL FACTORS Age, Behavior pattern, Fitness, Past/current experiences, and Time since onset of injury/illness/exacerbation are also affecting patient's functional outcome.   REHAB POTENTIAL: Good  CLINICAL DECISION MAKING: Evolving/moderate complexity  EVALUATION COMPLEXITY: Moderate   GOALS: Goals reviewed with patient? Yes  SHORT TERM GOALS: Target date: 01/03/2022  Will be compliant with appropriate progressive HEP  Baseline:  Modified goal Goal status:  ONGOING  2.  Will be able to name 3 ways to reduce fall risk at home and in community  Baseline:  Goal status: Not assessed  3.  Will demonstrate improved ankle dorsiflexor endurance and reduce related fall risk as evidenced by elimination of foot slap during 3MWT  Baseline:  Goal status: NOT MET    LONG TERM GOALS: Target date: 04/17/2022   MMT to improve by at least 1 grade in all weak groups  Baseline:  Goal status:  PROGRESSING  2.  Will be able to ambulate at least 524f safely in 3MWT with use of SPC vs SBQC with no more than S level assist to improve community access  Baseline:  Goal status: NOT MET  3.  Will score at least 37 on the Berg to show improved balance/reduced fall risk  Baseline:  Goal status:  60% MET; NOT ASSESSED TODAY  4.  Will demonstrate improved UE strength as evidenced by ability to lift rollator in/out of car for improved community access (for extended gait distances 1061f +) Baseline:  Goal status: GOAL MET  5.  Will be compliant with advanced HEP vs gym program to maintain functional gains and prevent recurrence of impairments  Baseline:  Goal status: ongoing     PLAN: PT FREQUENCY: 2x/week  PT DURATION: 4 weeks  PLANNED INTERVENTIONS: Therapeutic exercises, Therapeutic activity, Neuromuscular re-education, Balance training, Gait training, Patient/Family  education, Self Care, Joint mobilization, Stair training, DME instructions, Aquatic Therapy, Taping, Ionotophoresis '4mg'$ /ml Dexamethasone, Manual therapy, and Re-evaluation.  PLAN FOR NEXT SESSION: Cont to progress strength, fxnl endurance, and balance. Strengthen bilat hips, quad, and ankle dorsiflexors.  Assess DF ROM.  may need to consider AFOs in the future if gait/ankle strength does not improve significantly with PT.  Berg balance test next visit.   RSelinda MichaelsIII PT, DPT 03/28/22 12:12 PM

## 2022-03-29 ENCOUNTER — Ambulatory Visit (HOSPITAL_BASED_OUTPATIENT_CLINIC_OR_DEPARTMENT_OTHER): Payer: Medicare Other | Attending: Pain Medicine | Admitting: Physical Therapy

## 2022-03-29 ENCOUNTER — Encounter (HOSPITAL_BASED_OUTPATIENT_CLINIC_OR_DEPARTMENT_OTHER): Payer: Self-pay | Admitting: Physical Therapy

## 2022-03-29 DIAGNOSIS — R2681 Unsteadiness on feet: Secondary | ICD-10-CM | POA: Diagnosis present

## 2022-03-29 DIAGNOSIS — I10 Essential (primary) hypertension: Secondary | ICD-10-CM | POA: Insufficient documentation

## 2022-03-29 DIAGNOSIS — I2510 Atherosclerotic heart disease of native coronary artery without angina pectoris: Secondary | ICD-10-CM | POA: Insufficient documentation

## 2022-03-29 DIAGNOSIS — I480 Paroxysmal atrial fibrillation: Secondary | ICD-10-CM | POA: Insufficient documentation

## 2022-03-29 DIAGNOSIS — R609 Edema, unspecified: Secondary | ICD-10-CM | POA: Diagnosis present

## 2022-03-29 DIAGNOSIS — M5459 Other low back pain: Secondary | ICD-10-CM | POA: Insufficient documentation

## 2022-03-29 DIAGNOSIS — R296 Repeated falls: Secondary | ICD-10-CM | POA: Insufficient documentation

## 2022-03-29 DIAGNOSIS — R600 Localized edema: Secondary | ICD-10-CM | POA: Insufficient documentation

## 2022-03-29 DIAGNOSIS — M6281 Muscle weakness (generalized): Secondary | ICD-10-CM | POA: Insufficient documentation

## 2022-03-29 DIAGNOSIS — I5032 Chronic diastolic (congestive) heart failure: Secondary | ICD-10-CM | POA: Diagnosis present

## 2022-03-29 NOTE — Therapy (Signed)
OUTPATIENT PHYSICAL THERAPY TREATMENT NOTE         Patient Name: Charles Lloyd. MRN: [id number] DOB:1940-11-14, 82 y.o., male Today's Date: 03/29/2022   PT End of Session - 03/29/22 0842     Visit Number 23    Number of Visits 28    Date for PT Re-Evaluation 04/17/22    Authorization Type UHC MCR    PT Start Time 0803    PT Stop Time 0842    PT Time Calculation (min) 39 min    Activity Tolerance Patient tolerated treatment well    Behavior During Therapy [facility] for tasks assessed/performed                     Past Medical History:  Diagnosis Date   Acute tracheobronchitis 01/05/2019   B12 deficiency    Basal cell carcinoma (BCC) of left side of nose 01/28/2018   Cancer ([place])    Cardiomyopathy ([place]) 03/10/2018   With chronic atrial fibrillation   CHF (congestive heart failure) ([place]) 04/16/2013   Functional class II, ejection fraction 35-40%  Formatting of this note might be different from the original. Functional class II, ejection fraction 35-40%   Chronic anticoagulation    Chronic diastolic (congestive) heart failure ([place]) 04/16/2013   Functional class II, ejection fraction 35-40%  Last Assessment & Plan:  Clinically stable Volume well controlled meds reviewed   Chronic diastolic CHF (congestive heart failure) ([place]) 04/16/2013   Functional class II, ejection fraction 35-40%  Last Assessment & Plan:  Clinically stable Volume well controlled meds reviewed   Colon polyps 04/16/2013   Diabetic neuropathy (HCC)    Eosinophil count raised 02/17/2019   Essential hypertension 01/06/2010   Last Assessment & Plan:  Well controlled Continue med management   Gout    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  Steroid usage was discontinued several months ago.  Follow up with Dermatology as planned.   Hypercholesterolemia 01/06/2010   Last Assessment & Plan:  Repeat labs recommended   Hyperlipidemia associated  with type 2 diabetes mellitus ([place]) 01/28/2018   Hypertensive heart disease with heart failure ([place]) 01/06/2010   Last Assessment & Plan:  Well controlled Continue med management   Hyponatremia 12/17/2018   Infected prosthetic knee joint ([place]) 06/24/2017   Last Assessment & Plan:  Id following ?ongoing abx managementy reported Request records   Infection of prosthetic right knee joint ([place])    Insomnia 03/04/2012   Grief with loss of wife 02/20/12  Last Assessment & Plan:  Improved sx  contineu xanax prn Se discussed   Lesion of liver 04/20/2019   -On cardiac CT 02/2019   Mild reactive airways disease    Neoplasm of prostate 04/16/2013   Neoplasm of prostate, malignant ([place]) 01/06/2010   [name] S/p radiation therapy   Last Assessment & Plan:  Repeat psa today No urinary sx Pt reported fatigue/poor appetite   On amiodarone therapy 09/07/2013   On continuous oral anticoagulation 01/28/2018   Other activity(E029.9) 04/20/2013   Formatting of this note might be different from the original. Transthoracic Echocardiogram-03/10/[facility]: Normal left ventricular wall thickness and cavity size.  Global left ventricular systolic function is moderately reduced.  The estimated ejection fraction is 35-40%.  The left atrium is moderately enlarged.  The right atrium is mildly enlarged.  No significant valvular    Overweight (BMI 25.0-29.9) 10/22/2016   Persistent atrial fibrillation ([place]) 04/16/2013  Last Assessment & Plan:  Rate controlled Continue med management eliquis for stroke preventino  Formatting of this note might be different from the original.  Drug  HX Current Rx Pre-ABL inefficacy Pre-ABL intolerant Post-ABL inefficacy Post-ABL intolerant max dose/24h M/Y end comments  sotalol                  dofetilide                  flecainide                  propafenone                  am   S/P TKR (total knee replacement), bilateral    Secondary hypercoagulable state ([place])  04/09/2019   Tinea cruris 04/16/2013   Type 2 diabetes mellitus with diabetic polyneuropathy, without long-term current use of insulin ([place]) 04/16/2013   Last Assessment & Plan:  Labs today Pt reports well controlled on ambulatory monitoring   Vitamin D deficiency 07/25/2018   Past Surgical History:  Procedure Laterality Date   ATRIAL FIBRILLATION ABLATION N/A 03/12/2019   Procedure: ATRIAL FIBRILLATION ABLATION;  Surgeon: [name], MD;  Location: [place] CV LAB;  Service: Cardiovascular;  Laterality: N/A;   ATRIAL FIBRILLATION ABLATION N/A 04/29/2020   Procedure: ATRIAL FIBRILLATION ABLATION;  Surgeon: [name], MD;  Location: [place] CV LAB;  Service: Cardiovascular;  Laterality: N/A;   CARDIAC ELECTROPHYSIOLOGY STUDY AND ABLATION     CARDIOVERSION     CARDIOVERSION N/A 10/10/2018   Procedure: CARDIOVERSION;  Surgeon: [name], MD;  Location: [facility] ENDOSCOPY;  Service: Cardiovascular;  Laterality: N/A;   CARDIOVERSION N/A 05/18/2020   Procedure: CARDIOVERSION;  Surgeon: [name], MD;  Location[facility] ENDOSCOPY;  Service: Cardiovascular;  Laterality: N/A;   CARDIOVERSION N/A 12/09/2020   Procedure: CARDIOVERSION;  Surgeon: Lelon Perla, MD;  Location: [facility] ENDOSCOPY;  Service: Cardiovascular;  Laterality: N/A;   CHOLECYSTECTOMY     PROSTATECTOMY     REPLACEMENT TOTAL KNEE BILATERAL     Patient Active Problem List   Diagnosis Date Noted   Gout 02/27/2022   Acute renal failure superimposed on stage 3b chronic kidney disease ([place]) 11/16/2021   Uremia 11/16/2021   Gout due to renal impairment 10/17/2021   Pancytopenia ([place]) 08/05/2021   SIRS (systemic inflammatory response syndrome) ([place]) 08/04/2021   Chronic kidney disease, stage 3b ([place]) 12/30/2020   Multiple falls    Spinal stenosis    S/P TKR (total knee replacement), bilateral    Arthralgia 11/20/2019   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   Lesion of liver 04/20/2019   Secondary hypercoagulable  state ([place]) 04/09/2019   Eosinophil count raised 02/17/2019   Acute tracheobronchitis 01/05/2019   Asthma    Vitamin D deficiency 07/25/2018   Chronic anticoagulation 03/06/2018   Hyperlipidemia associated with type 2 diabetes mellitus ([place]) 01/28/2018   Basal cell carcinoma (BCC) of left side of nose 01/28/2018   Infected prosthetic knee joint ([place]) 06/24/2017   B12 deficiency 10/22/2016   Overweight (BMI 25.0-29.9) 10/22/2016   Primary osteoarthritis of right knee 03/29/2015   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   Paroxysmal A-fib ([place]) 04/16/2013   Chronic diastolic (congestive) heart failure ([place]) 04/16/2013   Colon polyps 04/16/2013   NIDDM-2 with polyneuropathy and hyperglycemia 04/16/2013   Tinea cruris 04/16/2013   Diabetic neuropathy ([place]) 07/14/2012   Insomnia 03/04/2012   Hypertensive heart disease with heart failure ([place]) 01/06/2010   Neoplasm  of prostate, malignant ([place]) 01/06/2010   Hypercholesterolemia 01/06/2010   Essential hypertension 01/06/2010    PCP: Isaac Bliss, Olam Idler   REFERRING PROVIDER: Reece Agar, MD   REFERRING DIAG: [phone] (ICD-10-CM) - Spinal stenosis, lumbar region with neurogenic claudication   Rationale for Evaluation and Treatment Rehabilitation  THERAPY DIAG:  Unsteadiness on feet  Repeated falls  Muscle weakness (generalized)  Other low back pain  ONSET DATE: 11/15/2021   SUBJECTIVE:                                                                                                                                                                                           SUBJECTIVE STATEMENT: Pt denies any adverse effects prior Rx.  Pt reports his back is feeling better.  Pt states he has been using Ibuprofen some which takes the edge off his back pain.  He didn't use Ibuprofen this AM.  Pt states his legs are stronger.  Pt reports the swelling in his legs has returned over the past week.  "It makes my legs feel like cement"      PERTINENT HISTORY:  Lumbar anterolisthesis, Peripheral neuropathy, TKR bilat, A-fib, CHF, DM  PAIN:  NPRS:  Current:  2/10, Worst:  5/10, Best:  0/10 Location:  central lumbar pain    PRECAUTIONS: Fall and Other: severe neuropathy   WEIGHT BEARING RESTRICTIONS No  FALLS:  Has patient fallen in last 6 months? Yes. Number of falls 3.5 (caught himself on one fall, on the other 3 falls my legs just gave way like someone took my legs out from under me)  LIVING ENVIRONMENT: Lives with: lives alone and family is near by and can help him easily  Lives in: House/apartment Stairs:  two story townhouse with stair lift inside, no STE but does have to deal with one step going up to chair lift Has following equipment at home: Single point cane, Walker - 4 wheeled, and stair lift, raised toilet seats, shower stool   OCCUPATION: retired   PLOF: Independent, Independent with basic ADLs, and Requires assistive device for independence  PATIENT GOALS stop falling, try to gain some strength   OBJECTIVE:   DIAGNOSTIC FINDINGS:  IMPRESSION: 1. 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 2. Stable to slight progression of severe spinal stenosis and moderate neural foraminal stenosis at L2-3. 3. Slight progression of moderate spinal stenosis at L3-4.    TODAY'S TREATMENT   Therapeutic Exercise:  Nustep L4 x 5 mins UE/LE's Heel raises 2x12 Lateral band walks with GTB around thighs with UE's on rail x 3 laps  Therapeutic Activities: Step ups on 4 inch step  with UE on rail x 10 reps bilat Step to gait and reciprocal gait over 4 hurdles with CGA with UE support on rail Stepping over 4 hurdles with reciprocal gait with CGA with UE support on rail  Neuro Re-ed Activities: Alt toe taps on 8 inch step without UE support with CGA 3x10.  Pt occasionally used UE support for LOB Tandem stance 2x30 sec bilat SLS with light UE support 3x20 sec bilat with CGA Marching on airex with light UE  support 2x20 with CGA Standing on airex with NBOS 2x30 sec    PATIENT EDUCATION:  Education details:  exercise form, exercise rationale, and POC.  Person educated: Patient Education method: Explanation, demonstration, verbal and tactile cues Education comprehension: verbalized understanding, returned demonstration, and verbal and visual cues required   HOME EXERCISE PROGRAM: Access Code: [email or web address] URL: [email or web address] Date: 12/01/2021 Prepared by: Ronny Flurry   ASSESSMENT:  CLINICAL IMPRESSION: Pt is improving with balance and stability.  He requires UE support with balance exercises though PT decreased UE support with balance exercises today challenging him.  PT provided appropriate guarding and used gait belt with balance exercises.  He is improving with stepping over hurdles including having improved foot clearance though does require UE support.  Pt is motivated and gives good effort with all exercises.  Pt responded well to Rx stating his back feels better and has less pain after Rx.  He also states he feels more loose after Rx.  Pt should benefit from cont skilled PT services to address ongoing goals, maximize functional mobility, and to improve safety with mobility.     OBJECTIVE IMPAIRMENTS Abnormal gait, cardiopulmonary status limiting activity, decreased balance, decreased coordination, decreased knowledge of use of DME, decreased mobility, difficulty walking, decreased strength, decreased safety awareness, increased edema, and impaired UE functional use.   ACTIVITY LIMITATIONS standing, squatting, stairs, transfers, bed mobility, reach over head, and locomotion level  PARTICIPATION LIMITATIONS: shopping, community activity, and yard work  PERSONAL FACTORS Age, Behavior pattern, Fitness, Past/current experiences, and Time since onset of injury/illness/exacerbation are also affecting patient's functional outcome.   REHAB POTENTIAL: Good  CLINICAL  DECISION MAKING: Evolving/moderate complexity  EVALUATION COMPLEXITY: Moderate   GOALS: Goals reviewed with patient? Yes  SHORT TERM GOALS: Target date: 01/03/2022  Will be compliant with appropriate progressive HEP  Baseline:  Modified goal Goal status:  ONGOING  2.  Will be able to name 3 ways to reduce fall risk at home and in community  Baseline:  Goal status: Not assessed  3.  Will demonstrate improved ankle dorsiflexor endurance and reduce related fall risk as evidenced by elimination of foot slap during 3MWT  Baseline:  Goal status: NOT MET    LONG TERM GOALS: Target date: 04/17/2022   MMT to improve by at least 1 grade in all weak groups  Baseline:  Goal status:  PROGRESSING  2.  Will be able to ambulate at least 565f safely in 3MWT with use of SPC vs SBQC with no more than S level assist to improve community access  Baseline:  Goal status: NOT MET  3.  Will score at least 37 on the [place] to show improved balance/reduced fall risk  Baseline:  Goal status:  60% MET; NOT ASSESSED TODAY  4.  Will demonstrate improved UE strength as evidenced by ability to lift rollator in/out of car for improved community access (for extended gait distances 10026f+) Baseline:  Goal status: GOAL MET  5.  Will be compliant with advanced HEP  vs gym program to maintain functional gains and prevent recurrence of impairments  Baseline:  Goal status: ongoing     PLAN: PT FREQUENCY: 2x/week  PT DURATION: 4 weeks  PLANNED INTERVENTIONS: Therapeutic exercises, Therapeutic activity, Neuromuscular re-education, Balance training, Gait training, Patient/Family education, Self Care, Joint mobilization, Stair training, DME instructions, Aquatic Therapy, Taping, Ionotophoresis '4mg'$ /ml Dexamethasone, Manual therapy, and Re-evaluation.  PLAN FOR NEXT SESSION: Cont to progress strength, fxnl endurance, and balance. Strengthen bilat hips, quad, and ankle dorsiflexors.  Assess DF ROM.  may need  to consider AFOs in the future if gait/ankle strength does not improve significantly with PT.     Selinda Michaels III PT, DPT 03/29/22 9:41 AM

## 2022-04-05 ENCOUNTER — Encounter: Payer: Self-pay | Admitting: Physician Assistant

## 2022-04-05 DIAGNOSIS — I2510 Atherosclerotic heart disease of native coronary artery without angina pectoris: Secondary | ICD-10-CM | POA: Insufficient documentation

## 2022-04-05 HISTORY — DX: Atherosclerotic heart disease of native coronary artery without angina pectoris: I25.10

## 2022-04-05 NOTE — Progress Notes (Addendum)
Cardiology Office Note:    Date:  04/06/2022   ID:  Charles Gum., DOB [birth year], MRN [id number]  PCP:  Isaac Bliss, [name], MD  [place] Cardiologist:  [name], MD Electrophysiologist:  Will Meredith Leeds, MD    Referring MD: Isaac Bliss, [place]*   Patient Profile: Paroxysmal atrial fibrillation  S/p PVI ablation in 2015, 2021, 2022 Recurrent AF >> Flecainide Rx Coronary artery disease  Myoview 05/25/2019: EF 60, normal perfusion, low risk CCTA (AF protocol) 04/25/20: CAC score 4242 (99th percentile) (HFpEF) heart failure with preserved ejection fraction  EF previously low (TTE in 02/2013 at [facility]): EF 35-40 TTE 08/12/2021: EF 50, no RWMA, hyperdynamic RVSF, normal PASP, mild MR, AV sclerosis without stenosis Hypertension  Hyperlipidemia  Diabetes mellitus  PVCs Monitor 03/22/2022: Sinus rhythm, PACs <1%, PVCs 10.4%, no A-fib flutter     History of Present Illness:   Charles Puente. is a 82 y.o. male with the above problem list.  He was last seen by Dr. Bettina Gavia 03/02/22. He is here alone. He returns for the evaluation of leg edema. He notes worsening leg edema over the past month.  His edema completely resolves with elevation.  It gets worse throughout the day.  His weight is up about 10 pounds over the past month.  He has not had shortness of breath, chest pain, orthopnea, PND or syncope.  Of note, he was placed on amlodipine about a month ago.  This medication was stopped last year due to leg edema.     Reviewed and updated this encounter:  Tobacco  Allergies  Meds  Problems  Med Hx  Surg Hx  Fam Hx     ROS  Labs/Other Test Reviewed:   Recent Labs: 11/16/2021: Magnesium 2.2; TSH 1.234 01/26/2022: ALT 27; B Natriuretic Peptide 380.8; BUN 33; Creatinine, Ser 1.12; Hemoglobin 12.2; Platelets 173; Potassium 4.3; Sodium 138   Recent Lipid Panel No results for input(s): "CHOL", "TRIG", "HDL", "VLDL", "LDLCALC", "LDLDIRECT" in the  last 8760 hours.   Risk Assessment/Calculations/Metrics:    CHA2DS2-VASc Score = 6   This indicates a 9.7% annual risk of stroke. The patient's score is based upon: CHF History: 1 HTN History: 1 Diabetes History: 1 Stroke History: 0 Vascular Disease History: 1 Age Score: 2 Gender Score: 0        HYPERTENSION CONTROL Vitals:   04/06/22 1002 04/06/22 1041  BP: (!) 158/62 (!) 148/66    The patient's blood pressure is elevated above target today.  In order to address the patient's elevated BP: A new medication was prescribed today.; Blood pressure will be monitored at home to determine if medication changes need to be made.      Physical Exam:   VS:  BP (!) 148/66   Pulse 67   Ht 5' 10"$  (1.778 m)   Wt 184 lb 6.4 oz (83.6 kg)   SpO2 96%   BMI 26.46 kg/m    Wt Readings from Last 3 Encounters:  04/06/22 184 lb 6.4 oz (83.6 kg)  03/02/22 181 lb (82.1 kg)  02/01/22 177 lb (80.3 kg)    Constitutional:      Appearance: Healthy appearance. Not in distress.  Neck:     Vascular: JVD normal.  Pulmonary:     Breath sounds: Normal breath sounds. No wheezing. No rales.  Cardiovascular:     Normal rate. Regularly irregular rhythm. Normal S1. Normal S2.      Murmurs: There is no murmur.  Edema:    Peripheral edema present.    Pretibial: bilateral 1+ edema of the pretibial area.    Ankle: bilateral 1+ edema of the ankle. Abdominal:     General: There is no distension.     Palpations: Abdomen is soft.         ASSESSMENT & PLAN:   Bilateral lower extremity edema He was taken off of Amlodipine last year b/c of leg edema. He does not have symptoms of volume excess and his exam is benign aside from leg edema. His edema improves with elevation. I suspect his edema is due to the the Amlodipine. Obtain CMET, CBC. Stop Amlodipine. Wear compression hose. Decrease Torsemide back to 20 mg once daily.   Essential hypertension BP is above target. He has AKI last year and was admitted.  He was taken off of Valsartan at that time. I am hesitant to rechallenge him with an ACE/ARB. As noted, I think his swelling is due to the Amlodipine. DC Amlodipine. Start Hydralazine 25 mg three times a day. Monitor BP and send readings in a couple of weeks. Continue Carvedilol 3.125 mg twice daily. F/u with Dr. Agustin Cree in [facility] in 2-3 mos.   Paroxysmal A-fib (HCC) S/p PVI ablation x 3. He is maintained on Flecainide. He is on the appropriate dose of Eliquis. Continue Eliquis 5 mg twice daily.   Coronary artery disease involving native coronary artery of native heart without angina pectoris CAC score 4242 on CT in 2022. He is not having angina. He is not on ASA as he is on Eliquis. Continue Lipitor 20 mg once daily.   Chronic heart failure with preserved ejection fraction (HFpEF) (HCC) EF was previously low but was low normal on TTE in 07/2021 (50%). Volume status stable. His clinical picture is not consistent with decompensated HF. He is mainly limited by his back issues. Obtain f/u CMET today. I have asked him to resume Torsemide 20 mg once daily.            Dispo:  Return in about 3 months (around 07/05/2022) for Routine Follow Up w/ Dr. Agustin Cree in [facility].   Signed, Richardson Dopp, PA-C  04/06/2022 10:55 AM    [facility] [address], [place], [place]  [address] Phone: [phone]; Fax: [phone]

## 2022-04-06 ENCOUNTER — Ambulatory Visit: Payer: Medicare Other | Admitting: Physician Assistant

## 2022-04-06 ENCOUNTER — Encounter: Payer: Self-pay | Admitting: Physician Assistant

## 2022-04-06 VITALS — BP 148/66 | HR 67 | Ht 70.0 in | Wt 184.4 lb

## 2022-04-06 DIAGNOSIS — R600 Localized edema: Secondary | ICD-10-CM

## 2022-04-06 DIAGNOSIS — I480 Paroxysmal atrial fibrillation: Secondary | ICD-10-CM | POA: Diagnosis not present

## 2022-04-06 DIAGNOSIS — I2510 Atherosclerotic heart disease of native coronary artery without angina pectoris: Secondary | ICD-10-CM

## 2022-04-06 DIAGNOSIS — I5032 Chronic diastolic (congestive) heart failure: Secondary | ICD-10-CM

## 2022-04-06 DIAGNOSIS — I10 Essential (primary) hypertension: Secondary | ICD-10-CM | POA: Diagnosis not present

## 2022-04-06 DIAGNOSIS — R609 Edema, unspecified: Secondary | ICD-10-CM

## 2022-04-06 MED ORDER — HYDRALAZINE HCL 25 MG PO TABS: 25.0000 mg | ORAL_TABLET | Freq: Three times a day (TID) | ORAL | 3 refills | Status: DC

## 2022-04-06 NOTE — Patient Instructions (Signed)
Medication Instructions:  Your physician has recommended you make the following change in your medication:   Stop taking amlodipine Start taking hydralazine 25 mg 3 times daily  Take torsemide 20 mg daily  *If you need a refill on your cardiac medications before your next appointment, please call your pharmacy*   Lab Work: CMET, CBC If you have labs (blood work) drawn today and your tests are completely normal, you will receive your results only by: [place] (if you have MyChart) OR A paper copy in the mail If you have any lab test that is abnormal or we need to change your treatment, we will call you to review the results.   Follow-Up: At [facility], you and your health needs are our priority.  As part of our continuing mission to provide you with exceptional heart care, we have created designated Provider Care Teams.  These Care Teams include your primary Cardiologist (physician) and Advanced Practice Providers (APPs -  Physician Assistants and Nurse Practitioners) who all work together to provide you with the care you need, when you need it.  We recommend signing up for the patient portal called "MyChart".  Sign up information is provided on this After Visit Summary.  MyChart is used to connect with patients for Virtual Visits (Telemedicine).  Patients are able to view lab/test results, encounter notes, upcoming appointments, etc.  Non-urgent messages can be sent to your provider as well.   To learn more about what you can do with MyChart, go to [email or web address].    Your next appointment:   2 - 3 month(s)  Provider:   Jenne Campus, MD    Other Instructions Check BP and send results in after 1-2 weeks  Wear compression hose  Support hose [place]  [place] [id number] [phone]  [email or web address]

## 2022-04-06 NOTE — Assessment & Plan Note (Signed)
S/p PVI ablation x 3. He is maintained on Flecainide. He is on the appropriate dose of Eliquis. Continue Eliquis 5 mg twice daily.

## 2022-04-06 NOTE — Assessment & Plan Note (Signed)
EF was previously low but was low normal on TTE in 07/2021 (50%). Volume status stable. His clinical picture is not consistent with decompensated HF. He is mainly limited by his back issues. Obtain f/u CMET today. I have asked him to resume Torsemide 20 mg once daily.

## 2022-04-06 NOTE — Assessment & Plan Note (Signed)
CAC score 4242 on CT in 2022. He is not having angina. He is not on ASA as he is on Eliquis. Continue Lipitor 20 mg once daily.

## 2022-04-06 NOTE — Assessment & Plan Note (Signed)
He was taken off of Amlodipine last year b/c of leg edema. He does not have symptoms of volume excess and his exam is benign aside from leg edema. His edema improves with elevation. I suspect his edema is due to the the Amlodipine. Obtain CMET, CBC. Stop Amlodipine. Wear compression hose. Decrease Torsemide back to 20 mg once daily.

## 2022-04-06 NOTE — Assessment & Plan Note (Signed)
BP is above target. He has AKI last year and was admitted. He was taken off of Valsartan at that time. I am hesitant to rechallenge him with an ACE/ARB. As noted, I think his swelling is due to the Amlodipine. DC Amlodipine. Start Hydralazine 25 mg three times a day. Monitor BP and send readings in a couple of weeks. Continue Carvedilol 3.125 mg twice daily. F/u with Dr. Agustin Cree in [facility] in 2-3 mos.

## 2022-04-07 LAB — COMPREHENSIVE METABOLIC PANEL
ALT: 28 IU/L (ref 0–44)
AST: 27 IU/L (ref 0–40)
Albumin/Globulin Ratio: 1.9 (ref 1.2–2.2)
Albumin: 4 g/dL (ref 3.8–4.8)
Alkaline Phosphatase: 114 IU/L (ref 44–121)
BUN/Creatinine Ratio: 20 (ref 10–24)
BUN: 27 mg/dL (ref 8–27)
Bilirubin Total: 0.8 mg/dL (ref 0.0–1.2)
CO2: 25 mmol/L (ref 20–29)
Calcium: 9.2 mg/dL (ref 8.6–10.2)
Chloride: 102 mmol/L (ref 96–106)
Creatinine, Ser: 1.33 mg/dL — ABNORMAL HIGH (ref 0.76–1.27)
Globulin, Total: 2.1 g/dL (ref 1.5–4.5)
Glucose: 142 mg/dL — ABNORMAL HIGH (ref 70–99)
Potassium: 3.6 mmol/L (ref 3.5–5.2)
Sodium: 142 mmol/L (ref 134–144)
Total Protein: 6.1 g/dL (ref 6.0–8.5)
eGFR: 54 mL/min/{1.73_m2} — ABNORMAL LOW (ref >59)

## 2022-04-07 LAB — CBC
Hematocrit: 36.4 % — ABNORMAL LOW (ref 37.5–51.0)
Hemoglobin: 11.5 g/dL — ABNORMAL LOW (ref 13.0–17.7)
MCH: 29.5 pg (ref 26.6–33.0)
MCHC: 31.6 g/dL (ref 31.5–35.7)
MCV: 93 fL (ref 79–97)
Platelets: 210 10*3/uL (ref 150–450)
RBC: 3.9 x10E6/uL — ABNORMAL LOW (ref 4.14–5.80)
RDW: 14.6 % (ref 11.6–15.4)
WBC: 6.1 10*3/uL (ref 3.4–10.8)

## 2022-04-11 ENCOUNTER — Encounter (HOSPITAL_BASED_OUTPATIENT_CLINIC_OR_DEPARTMENT_OTHER): Payer: Self-pay | Admitting: Physical Therapy

## 2022-04-11 ENCOUNTER — Ambulatory Visit (HOSPITAL_BASED_OUTPATIENT_CLINIC_OR_DEPARTMENT_OTHER): Payer: Medicare Other | Admitting: Physical Therapy

## 2022-04-11 DIAGNOSIS — R2681 Unsteadiness on feet: Secondary | ICD-10-CM

## 2022-04-11 DIAGNOSIS — M5459 Other low back pain: Secondary | ICD-10-CM

## 2022-04-11 DIAGNOSIS — R296 Repeated falls: Secondary | ICD-10-CM

## 2022-04-11 DIAGNOSIS — M6281 Muscle weakness (generalized): Secondary | ICD-10-CM

## 2022-04-11 NOTE — Therapy (Signed)
OUTPATIENT PHYSICAL THERAPY TREATMENT NOTE         Patient Name: Charles Lloyd. MRN: [id number] DOB:Feb 13, 1941, 82 y.o., male Today's Date: 04/12/2022   PT End of Session - 04/11/22 1200     Visit Number 24    Number of Visits 28    Date for PT Re-Evaluation 04/17/22    Authorization Type [employer] MCR    PT Start Time [id number]    PT Stop Time 1227    PT Time Calculation (min) 42 min    Activity Tolerance Patient tolerated treatment well    Behavior During Therapy WFL for tasks assessed/performed                     Past Medical History:  Diagnosis Date   Acute tracheobronchitis 01/05/2019   B12 deficiency    Basal cell carcinoma (BCC) of left side of nose 01/28/2018   Cancer ([place])    Cardiomyopathy ([place]) 03/10/2018   With chronic atrial fibrillation   CHF (congestive heart failure) ([place]) 04/16/2013   Functional class II, ejection fraction 35-40%  Formatting of this note might be different from the original. Functional class II, ejection fraction 35-40%   Chronic anticoagulation    Chronic diastolic (congestive) heart failure ([place]) 04/16/2013   Functional class II, ejection fraction 35-40%  Last Assessment & Plan:  Clinically stable Volume well controlled meds reviewed   Chronic diastolic CHF (congestive heart failure) ([place]) 04/16/2013   Functional class II, ejection fraction 35-40%  Last Assessment & Plan:  Clinically stable Volume well controlled meds reviewed   Colon polyps 04/16/2013   Coronary artery disease involving native coronary artery of native heart without angina pectoris 04/05/2022   Myoview 05/25/2019: EF 60, normal perfusion, low risk CCTA (AF protocol) 04/25/20: CAC score 4242 (99th percentile)   Diabetic neuropathy (HCC)    Eosinophil count raised 02/17/2019   Essential hypertension 01/06/2010   Last Assessment & Plan:  Well controlled Continue med management   Gout    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  Steroid usage was discontinued several months ago.  Follow up with Dermatology as planned.   Hypercholesterolemia 01/06/2010   Last Assessment & Plan:  Repeat labs recommended   Hyperlipidemia associated with type 2 diabetes mellitus ([place]) 01/28/2018   Hypertensive heart disease with heart failure ([place]) 01/06/2010   Last Assessment & Plan:  Well controlled Continue med management   Hyponatremia 12/17/2018   Infected prosthetic knee joint ([place]) 06/24/2017   Last Assessment & Plan:  Id following ?ongoing abx managementy reported Request records   Infection of prosthetic right knee joint ([place])    Insomnia 03/04/2012   Grief with loss of wife 02/20/12  Last Assessment & Plan:  Improved sx  contineu xanax prn Se discussed   Lesion of liver 04/20/2019   -On cardiac CT 02/2019   Mild reactive airways disease    Neoplasm of prostate 04/16/2013   Neoplasm of prostate, malignant ([place]) 01/06/2010   [name] S/p radiation therapy   Last Assessment & Plan:  Repeat psa today No urinary sx Pt reported fatigue/poor appetite   On amiodarone therapy 09/07/2013   On continuous oral anticoagulation 01/28/2018   Other activity(E029.9) 04/20/2013   Formatting of this note might be different from the original. Transthoracic Echocardiogram-03/10/2013-[facility]: Normal left ventricular wall thickness and cavity size.  Global left ventricular systolic function is moderately reduced.  The estimated ejection  fraction is 35-40%.  The left atrium is moderately enlarged.  The right atrium is mildly enlarged.  No significant valvular    Overweight (BMI 25.0-29.9) 10/22/2016   Persistent atrial fibrillation ([place]) 04/16/2013   Last Assessment & Plan:  Rate controlled Continue med management eliquis for stroke preventino  Formatting of this note might be different from the original.  Drug  HX Current Rx Pre-ABL inefficacy Pre-ABL intolerant Post-ABL inefficacy Post-ABL intolerant max  dose/24h M/Y end comments  sotalol                  dofetilide                  flecainide                  propafenone                  am   S/P TKR (total knee replacement), bilateral    Secondary hypercoagulable state ([place]) 04/09/2019   Tinea cruris 04/16/2013   Type 2 diabetes mellitus with diabetic polyneuropathy, without long-term current use of insulin ([place]) 04/16/2013   Last Assessment & Plan:  Labs today Pt reports well controlled on ambulatory monitoring   Vitamin D deficiency 07/25/2018   Past Surgical History:  Procedure Laterality Date   ATRIAL FIBRILLATION ABLATION N/A 03/12/2019   Procedure: ATRIAL FIBRILLATION ABLATION;  Surgeon: [name], MD;  Location: [place] [facility];  Service: Cardiovascular;  Laterality: N/A;   ATRIAL FIBRILLATION ABLATION N/A 04/29/2020   Procedure: ATRIAL FIBRILLATION ABLATION;  Surgeon: [name], MD;  Location: [place] [facility];  Service: Cardiovascular;  Laterality: N/A;   CARDIAC ELECTROPHYSIOLOGY STUDY AND ABLATION     CARDIOVERSION     CARDIOVERSION N/A 10/10/2018   Procedure: CARDIOVERSION;  Surgeon: [name], MD;  Location: [place];  Service: Cardiovascular;  Laterality: N/A;   CARDIOVERSION N/A 05/18/2020   Procedure: CARDIOVERSION;  Surgeon: [name], MD;  Location: [place];  Service: Cardiovascular;  Laterality: N/A;   CARDIOVERSION N/A 12/09/2020   Procedure: CARDIOVERSION;  Surgeon: Lelon Perla, MD;  Location: [facility];  Service: Cardiovascular;  Laterality: N/A;   CHOLECYSTECTOMY     PROSTATECTOMY     REPLACEMENT TOTAL KNEE BILATERAL     Patient Active Problem List   Diagnosis Date Noted   Bilateral lower extremity edema 04/06/2022   Coronary artery disease involving native coronary artery of native heart without angina pectoris 04/05/2022   Gout 02/27/2022   Acute renal failure superimposed on stage 3b chronic kidney disease ([place]) 11/16/2021   Uremia 11/16/2021   Gout due  to renal impairment 10/17/2021   Pancytopenia ([place]) 08/05/2021   SIRS (systemic inflammatory response syndrome) ([place]) 08/04/2021   Chronic kidney disease, stage 3b ([place]) 12/30/2020   Multiple falls    Spinal stenosis    S/P TKR (total knee replacement), bilateral    Arthralgia 11/20/2019   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   Lesion of liver 04/20/2019   Secondary hypercoagulable state ([place]) 04/09/2019   Eosinophil count raised 02/17/2019   Acute tracheobronchitis 01/05/2019   Asthma    Vitamin D deficiency 07/25/2018   Chronic anticoagulation 03/06/2018   Hyperlipidemia associated with type 2 diabetes mellitus ([place]) 01/28/2018   Basal cell carcinoma (BCC) of left side of nose 01/28/2018   Infected prosthetic knee joint ([place]) 06/24/2017   B12 deficiency 10/22/2016   Overweight (BMI 25.0-29.9) 10/22/2016   Primary osteoarthritis of right knee 03/29/2015  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   Paroxysmal A-fib ([place]) 04/16/2013   Chronic heart failure with preserved ejection fraction (HFpEF) ([place]) 04/16/2013   Colon polyps 04/16/2013   NIDDM-2 with polyneuropathy and hyperglycemia 04/16/2013   Tinea cruris 04/16/2013   Diabetic neuropathy ([place]) 07/14/2012   Insomnia 03/04/2012   Hypertensive heart disease with heart failure ([place]) 01/06/2010   Neoplasm of prostate, malignant ([place]) 01/06/2010   Hypercholesterolemia 01/06/2010   Essential hypertension 01/06/2010    PCP: Isaac Bliss, Olam Idler   REFERRING PROVIDER: Reece Agar, MD   REFERRING DIAG: [id number] (ICD-10-CM) - Spinal stenosis, lumbar region with neurogenic claudication   Rationale for Evaluation and Treatment Rehabilitation  THERAPY DIAG:  Unsteadiness on feet  Repeated falls  Muscle weakness (generalized)  Other low back pain  ONSET DATE: 11/15/2021   SUBJECTIVE:                                                                                                                                                                                            SUBJECTIVE STATEMENT: Pt has been absent from PT due to being out of town visiting his brother who had a CVA.  Pt saw a cardiovascular PA on Friday due to his swelling in his LE's.  Pt reports he has no swelling when he 1st wakes up though swelling gets worse as the day progresses.  Pt denies any adverse effects prior Rx though was fatigued after prior Rx.  He states his legs were burning after Rx.  Pt reports minimal pain in his back, but not bad.  Pt states he has been using Ibuprofen some which takes the edge off his back pain.  Pt drove to PT today.     PERTINENT HISTORY:  Lumbar anterolisthesis, Peripheral neuropathy, TKR bilat, A-fib, CHF, DM  PAIN:  NPRS:  Current:  2/10, Worst:  5/10, Best:  0/10 Location:  central lumbar pain    PRECAUTIONS: Fall and Other: severe neuropathy   WEIGHT BEARING RESTRICTIONS No  FALLS:  Has patient fallen in last 6 months? Yes. Number of falls 3.5 (caught himself on one fall, on the other 3 falls my legs just gave way like someone took my legs out from under me)  LIVING ENVIRONMENT: Lives with: lives alone and family is near by and can help him easily  Lives in: House/apartment Stairs:  two story townhouse with stair lift inside, no STE but does have to deal with one step going up to chair lift Has following equipment at home: Single point cane, Walker - 4 wheeled, and stair lift, raised toilet seats, shower stool   OCCUPATION: retired   PLOF:  Independent, Independent with basic ADLs, and Requires assistive device for independence  PATIENT GOALS stop falling, try to gain some strength   OBJECTIVE:   DIAGNOSTIC FINDINGS:  IMPRESSION: 1. 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 2. Stable to slight progression of severe spinal stenosis and moderate neural foraminal stenosis at L2-3. 3. Slight progression of moderate spinal stenosis at L3-4.    TODAY'S TREATMENT   Therapeutic Exercise:  Nustep  L4-5 x 5 mins UE/LE's Seated toe raises x20 reps Seated baps DF/PF 2x20 Heel raises 2x12 Lateral band walks with GTB around thighs with UE's on rail x 3 laps  Therapeutic Activities: Step ups on 4 inch step and 6 inch step with UE on rail x 10 reps each bilat Step to gait over 4 hurdles with CGA with and without UE support on rail.  Stepping over 4 hurdles with reciprocal gait with CGA with UE support on rail  Neuro Re-ed Activities: Alt toe taps on 8 inch step without UE support with CGA 2x10.  used UE support for 2-4 occasions for LOB Tandem stance 3x30 sec bilat with frequent UE support with CGA Tandem gait with CGA with UE support x 2 laps     PATIENT EDUCATION:  Education details:  exercise form, exercise rationale, and POC.  Person educated: Patient Education method: Explanation, demonstration, verbal and tactile cues Education comprehension: verbalized understanding, returned demonstration, and verbal and visual cues required   HOME EXERCISE PROGRAM: Access Code: [email or web address] URL: [email or web address] Date: 12/01/2021 Prepared by: Ronny Flurry   ASSESSMENT:  CLINICAL IMPRESSION: Pt has been absent from PT due to being out of town visiting his brother who had a CVA.  Pt performed exercises well today.  He gives good effort with all exercises.  Pt's balance was challenged today, and he required UE support with the majority of standing exercises.  PT provided appropriate guarding and used gait belt with balance exercises.  Pt responded well to Rx stating he feels good after Rx.       OBJECTIVE IMPAIRMENTS Abnormal gait, cardiopulmonary status limiting activity, decreased balance, decreased coordination, decreased knowledge of use of DME, decreased mobility, difficulty walking, decreased strength, decreased safety awareness, increased edema, and impaired UE functional use.   ACTIVITY LIMITATIONS standing, squatting, stairs, transfers, bed mobility, reach over  head, and locomotion level  PARTICIPATION LIMITATIONS: shopping, community activity, and yard work  PERSONAL FACTORS Age, Behavior pattern, Fitness, Past/current experiences, and Time since onset of injury/illness/exacerbation are also affecting patient's functional outcome.   REHAB POTENTIAL: Good  CLINICAL DECISION MAKING: Evolving/moderate complexity  EVALUATION COMPLEXITY: Moderate   GOALS: Goals reviewed with patient? Yes  SHORT TERM GOALS: Target date: 01/03/2022  Will be compliant with appropriate progressive HEP  Baseline:  Modified goal Goal status:  ONGOING  2.  Will be able to name 3 ways to reduce fall risk at home and in community  Baseline:  Goal status: Not assessed  3.  Will demonstrate improved ankle dorsiflexor endurance and reduce related fall risk as evidenced by elimination of foot slap during 3MWT  Baseline:  Goal status: NOT MET    LONG TERM GOALS: Target date: 04/17/2022   MMT to improve by at least 1 grade in all weak groups  Baseline:  Goal status:  PROGRESSING  2.  Will be able to ambulate at least 51f safely in 3MWT with use of SPC vs SBQC with no more than S level assist to improve community access  Baseline:  Goal status: NOT MET  3.  Will score at least 37 on the [place] to show improved balance/reduced fall risk  Baseline:  Goal status:  60% MET; NOT ASSESSED TODAY  4.  Will demonstrate improved UE strength as evidenced by ability to lift rollator in/out of car for improved community access (for extended gait distances 1013f +) Baseline:  Goal status: GOAL MET  5.  Will be compliant with advanced HEP vs gym program to maintain functional gains and prevent recurrence of impairments  Baseline:  Goal status: ongoing     PLAN: PT FREQUENCY: 2x/week  PT DURATION: 4 weeks  PLANNED INTERVENTIONS: Therapeutic exercises, Therapeutic activity, Neuromuscular re-education, Balance training, Gait training, Patient/Family education, Self  Care, Joint mobilization, Stair training, DME instructions, Aquatic Therapy, Taping, Ionotophoresis 49mml Dexamethasone, Manual therapy, and Re-evaluation.  PLAN FOR NEXT SESSION: Cont to progress strength, fxnl endurance, and balance. Strengthen bilat hips, quad, and ankle dorsiflexors.  Assess DF ROM.       RoSelinda MichaelsII PT, DPT 04/12/22 2:04 PM

## 2022-04-16 ENCOUNTER — Telehealth: Payer: Self-pay | Admitting: Cardiology

## 2022-04-16 ENCOUNTER — Other Ambulatory Visit: Payer: Self-pay

## 2022-04-16 NOTE — Telephone Encounter (Signed)
Pt c/o swelling: STAT is pt has developed SOB within 24 hours  If swelling, where is the swelling located? Both ankles and calves, right is a lot worse than the left one  How much weight have you gained and in what time span? 12lbs in about 1 1/2 weeks   Have you gained 3 pounds in a day or 5 pounds in a week? Did gain over 5lbs in a weeks  Do you have a log of your daily weights (if so, list)? No   Are you currently taking a fluid pill? Yes   Are you currently SOB? No   Have you traveled recently? No

## 2022-04-16 NOTE — Telephone Encounter (Signed)
Called patient he reported that he has gained 12 lbs. in 1.5 weeks in his lower extremities. The lower extremity edema is not there in the morning but as he is active throughout the day the swelling increases during the day until about 5 pm when he can barely walk on his feet because of the swelling. He is currently on Torsemide but he had an appointment last week at [facility] and his Torsemide was reduced. Please advise.

## 2022-04-17 ENCOUNTER — Ambulatory Visit (HOSPITAL_BASED_OUTPATIENT_CLINIC_OR_DEPARTMENT_OTHER): Payer: Medicare Other | Admitting: Physical Therapy

## 2022-04-17 DIAGNOSIS — M6281 Muscle weakness (generalized): Secondary | ICD-10-CM

## 2022-04-17 DIAGNOSIS — R2681 Unsteadiness on feet: Secondary | ICD-10-CM

## 2022-04-17 DIAGNOSIS — R296 Repeated falls: Secondary | ICD-10-CM

## 2022-04-17 DIAGNOSIS — M5459 Other low back pain: Secondary | ICD-10-CM

## 2022-04-17 NOTE — Therapy (Signed)
OUTPATIENT PHYSICAL THERAPY TREATMENT NOTE         Patient Name: Charles Lloyd. MRN: [id number] DOB:Dec 15, 1940, 82 y.o., male Today's Date: 04/18/2022   PT End of Session - 04/17/22 1446     Visit Number 25    Number of Visits 28    Date for PT Re-Evaluation 04/17/22    Authorization Type [employer] MCR    PT Start Time [id number]    PT Stop Time 1435    PT Time Calculation (min) 42 min    Activity Tolerance Patient tolerated treatment well    Behavior During Therapy WFL for tasks assessed/performed                     Past Medical History:  Diagnosis Date   Acute tracheobronchitis 01/05/2019   B12 deficiency    Basal cell carcinoma (BCC) of left side of nose 01/28/2018   Cancer ([place])    Cardiomyopathy ([place]) 03/10/2018   With chronic atrial fibrillation   CHF (congestive heart failure) ([place]) 04/16/2013   Functional class II, ejection fraction 35-40%  Formatting of this note might be different from the original. Functional class II, ejection fraction 35-40%   Chronic anticoagulation    Chronic diastolic (congestive) heart failure ([place]) 04/16/2013   Functional class II, ejection fraction 35-40%  Last Assessment & Plan:  Clinically stable Volume well controlled meds reviewed   Chronic diastolic CHF (congestive heart failure) ([place]) 04/16/2013   Functional class II, ejection fraction 35-40%  Last Assessment & Plan:  Clinically stable Volume well controlled meds reviewed   Colon polyps 04/16/2013   Coronary artery disease involving native coronary artery of native heart without angina pectoris 04/05/2022   Myoview 05/25/2019: EF 60, normal perfusion, low risk CCTA (AF protocol) 04/25/20: CAC score 4242 (99th percentile)   Diabetic neuropathy (HCC)    Eosinophil count raised 02/17/2019   Essential hypertension 01/06/2010   Last Assessment & Plan:  Well controlled Continue med management   Gout    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  Steroid usage was discontinued several months ago.  Follow up with Dermatology as planned.   Hypercholesterolemia 01/06/2010   Last Assessment & Plan:  Repeat labs recommended   Hyperlipidemia associated with type 2 diabetes mellitus ([place]) 01/28/2018   Hypertensive heart disease with heart failure ([place]) 01/06/2010   Last Assessment & Plan:  Well controlled Continue med management   Hyponatremia 12/17/2018   Infected prosthetic knee joint ([place]) 06/24/2017   Last Assessment & Plan:  Id following ?ongoing abx managementy reported Request records   Infection of prosthetic right knee joint ([place])    Insomnia 03/04/2012   Grief with loss of wife 02/20/12  Last Assessment & Plan:  Improved sx  contineu xanax prn Se discussed   Lesion of liver 04/20/2019   -On cardiac CT 02/2019   Mild reactive airways disease    Neoplasm of prostate 04/16/2013   Neoplasm of prostate, malignant ([place]) 01/06/2010   [name] S/p radiation therapy   Last Assessment & Plan:  Repeat psa today No urinary sx Pt reported fatigue/poor appetite   On amiodarone therapy 09/07/2013   On continuous oral anticoagulation 01/28/2018   Other activity(E029.9) 04/20/2013   Formatting of this note might be different from the original. Transthoracic Echocardiogram-03/10/2013-[facility]: Normal left ventricular wall thickness and cavity size.  Global left ventricular systolic function is moderately reduced.  The estimated ejection  fraction is 35-40%.  The left atrium is moderately enlarged.  The right atrium is mildly enlarged.  No significant valvular    Overweight (BMI 25.0-29.9) 10/22/2016   Persistent atrial fibrillation ([place]) 04/16/2013   Last Assessment & Plan:  Rate controlled Continue med management eliquis for stroke preventino  Formatting of this note might be different from the original.  Drug  HX Current Rx Pre-ABL inefficacy Pre-ABL intolerant Post-ABL inefficacy Post-ABL intolerant max  dose/24h M/Y end comments  sotalol                  dofetilide                  flecainide                  propafenone                  am   S/P TKR (total knee replacement), bilateral    Secondary hypercoagulable state ([place]) 04/09/2019   Tinea cruris 04/16/2013   Type 2 diabetes mellitus with diabetic polyneuropathy, without long-term current use of insulin ([place]) 04/16/2013   Last Assessment & Plan:  Labs today Pt reports well controlled on ambulatory monitoring   Vitamin D deficiency 07/25/2018   Past Surgical History:  Procedure Laterality Date   ATRIAL FIBRILLATION ABLATION N/A 03/12/2019   Procedure: ATRIAL FIBRILLATION ABLATION;  Surgeon: [name], MD;  Location: [facility];  Service: Cardiovascular;  Laterality: N/A;   ATRIAL FIBRILLATION ABLATION N/A 04/29/2020   Procedure: ATRIAL FIBRILLATION ABLATION;  Surgeon: [name], MD;  Location: [facility];  Service: Cardiovascular;  Laterality: N/A;   CARDIAC ELECTROPHYSIOLOGY STUDY AND ABLATION     CARDIOVERSION     CARDIOVERSION N/A 10/10/2018   Procedure: CARDIOVERSION;  Surgeon: [name], MD;  Location: [facility];  Service: Cardiovascular;  Laterality: N/A;   CARDIOVERSION N/A 05/18/2020   Procedure: CARDIOVERSION;  Surgeon: [name], MD;  Location: [facility];  Service: Cardiovascular;  Laterality: N/A;   CARDIOVERSION N/A 12/09/2020   Procedure: CARDIOVERSION;  Surgeon: Lelon Perla, MD;  Location: [facility];  Service: Cardiovascular;  Laterality: N/A;   CHOLECYSTECTOMY     PROSTATECTOMY     REPLACEMENT TOTAL KNEE BILATERAL     Patient Active Problem List   Diagnosis Date Noted   Bilateral lower extremity edema 04/06/2022   Coronary artery disease involving native coronary artery of native heart without angina pectoris 04/05/2022   Gout 02/27/2022   Acute renal failure superimposed on stage 3b chronic kidney disease ([place]) 11/16/2021   Uremia 11/16/2021   Gout due  to renal impairment 10/17/2021   Pancytopenia ([place]) 08/05/2021   SIRS (systemic inflammatory response syndrome) ([place]) 08/04/2021   Chronic kidney disease, stage 3b ([place]) 12/30/2020   Multiple falls    Spinal stenosis    S/P TKR (total knee replacement), bilateral    Arthralgia 11/20/2019   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   Lesion of liver 04/20/2019   Secondary hypercoagulable state ([place]) 04/09/2019   Eosinophil count raised 02/17/2019   Acute tracheobronchitis 01/05/2019   Asthma    Vitamin D deficiency 07/25/2018   Chronic anticoagulation 03/06/2018   Hyperlipidemia associated with type 2 diabetes mellitus ([place]) 01/28/2018   Basal cell carcinoma (BCC) of left side of nose 01/28/2018   Infected prosthetic knee joint ([place]) 06/24/2017   B12 deficiency 10/22/2016   Overweight (BMI 25.0-29.9) 10/22/2016   Primary osteoarthritis of right knee 03/29/2015  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   Paroxysmal A-fib ([place]) 04/16/2013   Chronic heart failure with preserved ejection fraction (HFpEF) ([place]) 04/16/2013   Colon polyps 04/16/2013   NIDDM-2 with polyneuropathy and hyperglycemia 04/16/2013   Tinea cruris 04/16/2013   Diabetic neuropathy ([place]) 07/14/2012   Insomnia 03/04/2012   Hypertensive heart disease with heart failure ([place]) 01/06/2010   Neoplasm of prostate, malignant ([place]) 01/06/2010   Hypercholesterolemia 01/06/2010   Essential hypertension 01/06/2010    PCP: Isaac Bliss, [facility] Id[facility]   REFERRING PROVIDER: Reece Agar, MD   REFERRING DIAG: [phone] (ICD-10-CM) - Spinal stenosis, lumbar region with neurogenic claudication   Rationale for Evaluation and Treatment Rehabilitation  THERAPY DIAG:  Unsteadiness on feet  Repeated falls  Muscle weakness (generalized)  Other low back pain  ONSET DATE: 11/15/2021   SUBJECTIVE:                                                                                                                                                                                            SUBJECTIVE STATEMENT: Pt is waiting a call back from cardiologist.  Pt reports his MD changed his meds though he is still having a lot of swelling in bilat LE's.  Pt states the medication change hasn't helped his swelling.  Pt has lumbar injections next week.   "I can walk around and do things 100 times better"  Pt reports improved balance.  Pt ambulates into grocery store without AD and pushes the cart with shopping.  Pt is not using an AD while ambulating at home.   Pt states he brings the rollator with him to PT due not knowing where he is going to park.  Pt reports having weakness in bilat LE's with a lot of walking.        PERTINENT HISTORY:  Lumbar anterolisthesis, Peripheral neuropathy, TKR bilat, A-fib, CHF, DM  PAIN:  NPRS:  Current:  3/10, Worst:  5/10, Best:  0/10 Location:  central lumbar pain   Pt reports he has numbness in feet.     PRECAUTIONS: Fall and Other: severe neuropathy   WEIGHT BEARING RESTRICTIONS No  FALLS:  Has patient fallen in last 6 months? Yes. Number of falls 3.5 (caught himself on one fall, on the other 3 falls my legs just gave way like someone took my legs out from under me)  LIVING ENVIRONMENT: Lives with: lives alone and family is near by and can help him easily  Lives in: House/apartment Stairs:  two story townhouse with stair lift inside, no STE but does have to deal with one step going up to chair lift Has following equipment at  home: Single point cane, Walker - 4 wheeled, and stair lift, raised toilet seats, shower stool   OCCUPATION: retired   PLOF: Independent, Independent with basic ADLs, and Requires assistive device for independence  PATIENT GOALS stop falling, try to gain some strength   OBJECTIVE:   DIAGNOSTIC FINDINGS:  IMPRESSION: 1. 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 2. Stable to slight progression of severe spinal stenosis and moderate neural foraminal stenosis at L2-3. 3.  Slight progression of moderate spinal stenosis at L3-4.    TODAY'S TREATMENT   3 minute walk test:  Prior 2 tests:  550 and 483 ft with rollator            Current: 481 ft with rollator   Therapeutic Exercise:  Nustep L5 x 5 mins UE/LE's Seated toe raises x20 reps Heel raises 2x10   Therapeutic Activities: Step ups on 1st step at stairwell 3x5 R LE, 2x7 and 1x5 L LE with UE support on rail and SBA/CGA Step to gait over 4 hurdles with CGA with and without UE support on rail.  Stepping over 4 hurdles with reciprocal gait with CGA with UE support on rail Lateral stepping over 4 hurdles with UE support with CGA Tandem gait with CGA with UE support x 2 laps     PATIENT EDUCATION:  Education details:  exercise form, exercise rationale, and POC.  Person educated: Patient Education method: Explanation, demonstration, verbal and tactile cues Education comprehension: verbalized understanding, returned demonstration, and verbal and visual cues required   HOME EXERCISE PROGRAM: Access Code: [email or web address] URL: [email or web address] Date: 12/01/2021 Prepared by: Ronny Flurry   ASSESSMENT:  CLINICAL IMPRESSION: Pt reports improved balance and mobility.  He is having issues with LE swelling and is waiting a call back from cardiologist.  Pt performed 3 MWT test today and he had slightly less distance today by 2 feet though was worse than the test before last.  PT worked on steps ups on the 1st step with the usage of rail.  He continues to have functional LE weakness.  Pt has difficulty with step ups as evidenced by performance of step ups on the 1st of the stairwell.  He gives good effort with all exercises.  Pt's balance was challenged today, and he required UE support with the majority of standing exercises.  PT provided appropriate guarding and used gait belt with balance exercises.  PT will assess balance and strength progress next Rx.       OBJECTIVE IMPAIRMENTS Abnormal  gait, cardiopulmonary status limiting activity, decreased balance, decreased coordination, decreased knowledge of use of DME, decreased mobility, difficulty walking, decreased strength, decreased safety awareness, increased edema, and impaired UE functional use.   ACTIVITY LIMITATIONS standing, squatting, stairs, transfers, bed mobility, reach over head, and locomotion level  PARTICIPATION LIMITATIONS: shopping, community activity, and yard work  PERSONAL FACTORS Age, Behavior pattern, Fitness, Past/current experiences, and Time since onset of injury/illness/exacerbation are also affecting patient's functional outcome.   REHAB POTENTIAL: Good  CLINICAL DECISION MAKING: Evolving/moderate complexity  EVALUATION COMPLEXITY: Moderate   GOALS: Goals reviewed with patient? Yes  SHORT TERM GOALS: Target date: 01/03/2022  Will be compliant with appropriate progressive HEP  Baseline:  Modified goal Goal status:  ONGOING  2.  Will be able to name 3 ways to reduce fall risk at home and in community  Baseline:  Goal status: Not assessed  3.  Will demonstrate improved ankle dorsiflexor endurance and reduce related fall risk as evidenced by elimination of foot slap  during 3MWT  Baseline:  Goal status: NOT MET    LONG TERM GOALS: Target date: 04/17/2022   MMT to improve by at least 1 grade in all weak groups  Baseline:  Goal status:  PROGRESSING  2.  Will be able to ambulate at least 559f safely in 3MWT with use of SPC vs SBQC with no more than S level assist to improve community access  Baseline:  Goal status: NOT MET  3.  Will score at least 37 on the Berg to show improved balance/reduced fall risk  Baseline:  Goal status:  60% MET; NOT ASSESSED TODAY  4.  Will demonstrate improved UE strength as evidenced by ability to lift rollator in/out of car for improved community access (for extended gait distances 10027f+) Baseline:  Goal status: GOAL MET  5.  Will be compliant with  advanced HEP vs gym program to maintain functional gains and prevent recurrence of impairments  Baseline:  Goal status: ongoing     PLAN: PT FREQUENCY: 2x/week  PT DURATION: 4 weeks  PLANNED INTERVENTIONS: Therapeutic exercises, Therapeutic activity, Neuromuscular re-education, Balance training, Gait training, Patient/Family education, Self Care, Joint mobilization, Stair training, DME instructions, Aquatic Therapy, Taping, Ionotophoresis 71m55ml Dexamethasone, Manual therapy, and Re-evaluation.  PLAN FOR NEXT SESSION: PN vs discharge next Rx.  Assess DF ROM and LE strength.  Perform Berg balance test.    RobSelinda MichaelsI PT, DPT 04/18/22 6:55 PM

## 2022-04-18 ENCOUNTER — Other Ambulatory Visit: Payer: Self-pay

## 2022-04-18 ENCOUNTER — Encounter (HOSPITAL_BASED_OUTPATIENT_CLINIC_OR_DEPARTMENT_OTHER): Payer: Self-pay | Admitting: Physical Therapy

## 2022-04-18 DIAGNOSIS — I5022 Chronic systolic (congestive) heart failure: Secondary | ICD-10-CM

## 2022-04-18 NOTE — Telephone Encounter (Signed)
Called the patient and informed him of Dr. Wendy Poet recommendation below:  "Lets check Chem-7 and proBNP to see if he can increase his diuretic again"  Lab work was ordered via [employer] and the patient stated that he would have the blood work completed when he returned from a trip on Monday. Patient had no further questions at this time.

## 2022-04-19 ENCOUNTER — Ambulatory Visit (HOSPITAL_BASED_OUTPATIENT_CLINIC_OR_DEPARTMENT_OTHER): Payer: Medicare Other | Admitting: Physical Therapy

## 2022-04-24 ENCOUNTER — Encounter (HOSPITAL_BASED_OUTPATIENT_CLINIC_OR_DEPARTMENT_OTHER): Payer: Self-pay | Admitting: Physical Therapy

## 2022-04-24 ENCOUNTER — Ambulatory Visit (HOSPITAL_BASED_OUTPATIENT_CLINIC_OR_DEPARTMENT_OTHER): Payer: Medicare Other | Admitting: Physical Therapy

## 2022-04-24 DIAGNOSIS — R296 Repeated falls: Secondary | ICD-10-CM

## 2022-04-24 DIAGNOSIS — R2681 Unsteadiness on feet: Secondary | ICD-10-CM | POA: Diagnosis not present

## 2022-04-24 DIAGNOSIS — M5459 Other low back pain: Secondary | ICD-10-CM

## 2022-04-24 DIAGNOSIS — M6281 Muscle weakness (generalized): Secondary | ICD-10-CM

## 2022-04-24 NOTE — Therapy (Signed)
OUTPATIENT PHYSICAL THERAPY TREATMENT NOTE         Patient Name: Detron Turvey. MRN: [id number] DOB:Oct 07, 1940, 82 y.o., male Today's Date: 04/24/2022   PT End of Session - 04/24/22 1313     Visit Number 26    Number of Visits 30    Date for PT Re-Evaluation 05/29/22    Authorization Type [employer] MCR    PT Start Time 1307    PT Stop Time 1352    PT Time Calculation (min) 45 min    Equipment Utilized During Treatment Gait belt    Activity Tolerance Patient tolerated treatment well    Behavior During Therapy WFL for tasks assessed/performed                     Past Medical History:  Diagnosis Date   Acute tracheobronchitis 01/05/2019   B12 deficiency    Basal cell carcinoma (BCC) of left side of nose 01/28/2018   Cancer ([place])    Cardiomyopathy ([place]) 03/10/2018   With chronic atrial fibrillation   CHF (congestive heart failure) ([place]) 04/16/2013   Functional class II, ejection fraction 35-40%  Formatting of this note might be different from the original. Functional class II, ejection fraction 35-40%   Chronic anticoagulation    Chronic diastolic (congestive) heart failure ([place]) 04/16/2013   Functional class II, ejection fraction 35-40%  Last Assessment & Plan:  Clinically stable Volume well controlled meds reviewed   Chronic diastolic CHF (congestive heart failure) ([place]) 04/16/2013   Functional class II, ejection fraction 35-40%  Last Assessment & Plan:  Clinically stable Volume well controlled meds reviewed   Colon polyps 04/16/2013   Coronary artery disease involving native coronary artery of native heart without angina pectoris 04/05/2022   Myoview 05/25/2019: EF 60, normal perfusion, low risk CCTA (AF protocol) 04/25/20: CAC score 4242 (99th percentile)   Diabetic neuropathy (HCC)    Eosinophil count raised 02/17/2019   Essential hypertension 01/06/2010   Last Assessment & Plan:  Well controlled Continue med management   Gout    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  Steroid usage was discontinued several months ago.  Follow up with Dermatology as planned.   Hypercholesterolemia 01/06/2010   Last Assessment & Plan:  Repeat labs recommended   Hyperlipidemia associated with type 2 diabetes mellitus ([place]) 01/28/2018   Hypertensive heart disease with heart failure ([place]) 01/06/2010   Last Assessment & Plan:  Well controlled Continue med management   Hyponatremia 12/17/2018   Infected prosthetic knee joint ([place]) 06/24/2017   Last Assessment & Plan:  Id following ?ongoing abx managementy reported Request records   Infection of prosthetic right knee joint ([place])    Insomnia 03/04/2012   Grief with loss of wife 02/20/12  Last Assessment & Plan:  Improved sx  contineu xanax prn Se discussed   Lesion of liver 04/20/2019   -On cardiac CT 02/2019   Mild reactive airways disease    Neoplasm of prostate 04/16/2013   Neoplasm of prostate, malignant ([place]) 01/06/2010   [name] S/p radiation therapy   Last Assessment & Plan:  Repeat psa today No urinary sx Pt reported fatigue/poor appetite   On amiodarone therapy 09/07/2013   On continuous oral anticoagulation 01/28/2018   Other activity(E029.9) 04/20/2013   Formatting of this note might be different from the original. Transthoracic Echocardiogram-03/10/2013-[facility]: Normal left ventricular wall thickness and cavity size.  Global left ventricular  systolic function is moderately reduced.  The estimated ejection fraction is 35-40%.  The left atrium is moderately enlarged.  The right atrium is mildly enlarged.  No significant valvular    Overweight (BMI 25.0-29.9) 10/22/2016   Persistent atrial fibrillation ([place]) 04/16/2013   Last Assessment & Plan:  Rate controlled Continue med management eliquis for stroke preventino  Formatting of this note might be different from the original.  Drug  HX Current Rx Pre-ABL inefficacy Pre-ABL  intolerant Post-ABL inefficacy Post-ABL intolerant max dose/24h M/Y end comments  sotalol                  dofetilide                  flecainide                  propafenone                  am   S/P TKR (total knee replacement), bilateral    Secondary hypercoagulable state ([place]) 04/09/2019   Tinea cruris 04/16/2013   Type 2 diabetes mellitus with diabetic polyneuropathy, without long-term current use of insulin ([place]) 04/16/2013   Last Assessment & Plan:  Labs today Pt reports well controlled on ambulatory monitoring   Vitamin D deficiency 07/25/2018   Past Surgical History:  Procedure Laterality Date   ATRIAL FIBRILLATION ABLATION N/A 03/12/2019   Procedure: ATRIAL FIBRILLATION ABLATION;  Surgeon: [name], MD;  Location: [facility];  Service: Cardiovascular;  Laterality: N/A;   ATRIAL FIBRILLATION ABLATION N/A 04/29/2020   Procedure: ATRIAL FIBRILLATION ABLATION;  Surgeon: [name], MD;  Location: [facility];  Service: Cardiovascular;  Laterality: N/A;   CARDIAC ELECTROPHYSIOLOGY STUDY AND ABLATION     CARDIOVERSION     CARDIOVERSION N/A 10/10/2018   Procedure: CARDIOVERSION;  Surgeon: [name], MD;  Location: [facility];  Service: Cardiovascular;  Laterality: N/A;   CARDIOVERSION N/A 05/18/2020   Procedure: CARDIOVERSION;  Surgeon: [name], MD;  Location: [facility];  Service: Cardiovascular;  Laterality: N/A;   CARDIOVERSION N/A 12/09/2020   Procedure: CARDIOVERSION;  Surgeon: Lelon Perla, MD;  Location[facility];  Service: Cardiovascular;  Laterality: N/A;   CHOLECYSTECTOMY     PROSTATECTOMY     REPLACEMENT TOTAL KNEE BILATERAL     Patient Active Problem List   Diagnosis Date Noted   Bilateral lower extremity edema 04/06/2022   Coronary artery disease involving native coronary artery of native heart without angina pectoris 04/05/2022   Gout 02/27/2022   Acute renal failure superimposed on stage 3b chronic kidney  disease ([place]) 11/16/2021   Uremia 11/16/2021   Gout due to renal impairment 10/17/2021   Pancytopenia ([place]) 08/05/2021   SIRS (systemic inflammatory response syndrome) ([place]) 08/04/2021   Chronic kidney disease, stage 3b ([place]) 12/30/2020   Multiple falls    Spinal stenosis    S/P TKR (total knee replacement), bilateral    Arthralgia 11/20/2019   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   Lesion of liver 04/20/2019   Secondary hypercoagulable state ([place]) 04/09/2019   Eosinophil count raised 02/17/2019   Acute tracheobronchitis 01/05/2019   Asthma    Vitamin D deficiency 07/25/2018   Chronic anticoagulation 03/06/2018   Hyperlipidemia associated with type 2 diabetes mellitus ([place]) 01/28/2018   Basal cell carcinoma (BCC) of left side of nose 01/28/2018   Infected prosthetic knee joint ([place]) 06/24/2017   B12 deficiency 10/22/2016   Overweight (BMI 25.0-29.9) 10/22/2016  Primary osteoarthritis of right knee 03/29/2015   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   Paroxysmal A-fib (HCC) 04/16/2013   Chronic heart failure with preserved ejection fraction (HFpEF) ([place]) 04/16/2013   Colon polyps 04/16/2013   NIDDM-2 with polyneuropathy and hyperglycemia 04/16/2013   Tinea cruris 04/16/2013   Diabetic neuropathy ([place]) 07/14/2012   Insomnia 03/04/2012   Hypertensive heart disease with heart failure ([place]) 01/06/2010   Neoplasm of prostate, malignant ([place]) 01/06/2010   Hypercholesterolemia 01/06/2010   Essential hypertension 01/06/2010    PCP: Isaac Bliss, [facility] Idler   REFERRING PROVIDER: Reece Agar, MD   REFERRING DIAG: [phone] (ICD-10-CM) - Spinal stenosis, lumbar region with neurogenic claudication   Rationale for Evaluation and Treatment Rehabilitation  THERAPY DIAG:  Unsteadiness on feet  Repeated falls  Muscle weakness (generalized)  Other low back pain  ONSET DATE: 11/15/2021   SUBJECTIVE:                                                                                                                                                                                            SUBJECTIVE STATEMENT: Pt continues to have LE swelling is having blood work done Architectural technologist. Pt was in the car traveling yesterday for 7 hours and his back is bothering him.  He is scheduled for lumbar injections on Thursday.   Pt denies any adverse effects after prior Rx.  Pt reports compliance with HEP.  He states he is also performing squats while holding on the kitchen sink.   "I can walk around and do things 100 times better"  Pt reports improved balance.  Pt ambulates into grocery store without AD and pushes the cart with shopping.  Pt is not using an AD while ambulating at home.   Pt states he brings the rollator with him to PT due not knowing where he is going to park.  Pt reports having weakness in bilat LE's with a lot of walking.        PERTINENT HISTORY:  Lumbar anterolisthesis, Peripheral neuropathy, TKR bilat, A-fib, CHF, DM  PAIN:  NPRS:  Current:  3/10, Worst:  5/10, Best:  0/10 Location:  central lumbar pain   Pt reports he has numbness in feet.     PRECAUTIONS: Fall and Other: severe neuropathy   WEIGHT BEARING RESTRICTIONS No  FALLS:  Has patient fallen in last 6 months? Yes. Number of falls 3.5 (caught himself on one fall, on the other 3 falls my legs just gave way like someone took my legs out from under me)  LIVING ENVIRONMENT: Lives with: lives alone and family is near by and can help him easily  Lives  in: House/apartment Stairs:  two story townhouse with stair lift inside, no STE but does have to deal with one step going up to chair lift Has following equipment at home: Single point cane, Walker - 4 wheeled, and stair lift, raised toilet seats, shower stool   OCCUPATION: retired   PLOF: Independent, Independent with basic ADLs, and Requires assistive device for independence  PATIENT GOALS stop falling, try to gain some strength   OBJECTIVE:   DIAGNOSTIC FINDINGS:   IMPRESSION: 1. 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 2. Stable to slight progression of severe spinal stenosis and moderate neural foraminal stenosis at L2-3. 3. Slight progression of moderate spinal stenosis at L3-4.    TODAY'S TREATMENT   PHYSICAL PERFORMANCE TESTING:   -Reviewed current function, response to prior Rx, pain levels, and HEP compliance.   LOWER EXTREMITY MMT:     MMT Right 11/7 Left 11/7 Right 12/14 Left 12/14 Right 1/23 Left 1/23 Right 2/27 Left  2/27  Hip flexion 4/5 4/5 4/5 4/5 4/5 4+/5 4+/5 4+/5  Hip extension                Hip abduction 4-/5 3+/5 4-/5 4-/5 4-/5 4-/5 4/5 4/5  Hip adduction                Hip internal rotation                Hip external rotation                Knee flexion         4+/5 seated 5/5 seated 5/5 Seated  5/5 seated   Knee extension 4/5  4+/5 4+/5 5/5 5/5 5/5 5/5 5/5  Ankle dorsiflexion <3/5, unable to tolerate resistance 3+/5 Unable to tolerate resistance Unable to tolerate resistance Tolerated slight resistance Unable to tolerate any resistance Tolerated slight resistance Unable to tolerate any resistance  Ankle plantarflexion                Ankle inversion Unable to tolerate resistance 3+/5 4-/5 4+/5     4-/5 4+/5  Ankle eversion Unable to tolerate resistance Unable to tolerate resistance 4+/5 4-/5     4+/5 4+/5   (Blank rows = not tested)Therapeutic Exercise:       -DF AROM:  Lacking 14 deg/lacking 8 deg from neutral in R/L ankle  Berg Balance Scale: Initial/Prior/Current:  27/56 / 34/56  / 40/56     BERG Balance Test                                              Date:    Sit to Stand 4  Standing unsupported 4  Sitting with back unsupported but feet supported 4  Stand to sit  4  Transfers  4  Standing unsupported with eyes closed 2  Standing unsupported feet together 4  From standing position, reach forward with outstretched arm 3  From standing position, pick up object from floor 3  From  standing position, turn and look behind over each shoulder 1  Turn 360 4  Standing unsupported, alternately place foot on step 2  Standing unsupported, one foot in front 1  Standing on one leg 0  Total:  40/56      FOTO:  63 which has improved from 59.  Goal of 64.    Therapeutic Exercise:  Seated toe raises x20 reps Eversion AROM 3x10 bilat Stepping over 4 hurdles with reciprocal gait with CGA with UE support on rail    PATIENT EDUCATION:  Education details:  objective form, HEP, exercise form, exercise rationale, and POC.  Person educated: Patient Education method: Explanation, demonstration, verbal and tactile cues Education comprehension: verbalized understanding, returned demonstration, and verbal and visual cues required   HOME EXERCISE PROGRAM: Access Code: [email or web address] URL: [email or web address] Date: 12/01/2021 Prepared by: Ronny Flurry   ASSESSMENT:  CLINICAL IMPRESSION: Pt is progressing with balance as evidenced by subjective reports and Berg Balance Score.  Pt reports he is doing much better since starting PT.  Pt demonstrates improved balance with Berg Balance Assessment score increasing from 34/56 prior to 40/56 currently.  Pt has neuropathy which impacts his balance.  Pt has not had any falls recently.  He continues to have limited DF bilat.  Pt demonstrated some improvement in hip and knee strength though continues to have fxnl LE weakness.  He reports improved mobility and decreased reliance on AD with ambulation.  Pt did not improve on 3 MWT last visit.  Pt continues to have back pain and is having injections later this week.  He is having issues with LE swelling.  His meds have changed and he is having blood work done Architectural technologist.  Pt partially met LTG #1 and met LTG #3.  Pt demonstrates improved self perceived disability with FOTO score improving and he nearly met FOTO goal.  He should benefit from cont skilled PT services to address ongoing goals,  improve pain and strength, and to assist in maximizing functional mobility.        OBJECTIVE IMPAIRMENTS Abnormal gait, cardiopulmonary status limiting activity, decreased balance, decreased coordination, decreased knowledge of use of DME, decreased mobility, difficulty walking, decreased strength, decreased safety awareness, increased edema, and impaired UE functional use.   ACTIVITY LIMITATIONS standing, squatting, stairs, transfers, bed mobility, reach over head, and locomotion level  PARTICIPATION LIMITATIONS: shopping, community activity, and yard work  PERSONAL FACTORS Age, Behavior pattern, Fitness, Past/current experiences, and Time since onset of injury/illness/exacerbation are also affecting patient's functional outcome.   REHAB POTENTIAL: Good  CLINICAL DECISION MAKING: Evolving/moderate complexity  EVALUATION COMPLEXITY: Moderate   GOALS: Goals reviewed with patient? Yes  SHORT TERM GOALS: Target date: 01/03/2022  Will be compliant with appropriate progressive HEP  Baseline:  GOAL MET Goal status:  ONGOING  2.  Will be able to name 3 ways to reduce fall risk at home and in community  Baseline:  Goal status: Discontinue goal  3.  Will demonstrate improved ankle dorsiflexor endurance and reduce related fall risk as evidenced by elimination of foot slap during 3MWT  Baseline:  Goal status: NOT MET    LONG TERM GOALS: Target date: 04/17/2022   MMT to improve by at least 1 grade in all weak groups  Baseline:  Goal status:  PARTIALLY MET Target date:  05/29/2022  2.  Will be able to ambulate at least 500 ft safely in 3MWT with use of rollator with no more than S level assist to improve community access  Baseline:  Goal status: NOT MET/modified Target date:  05/29/2022   3.  Will score at least 37 on the Berg to show improved balance/reduced fall risk  Baseline:  Goal status:  GOAL MET  4.  Will demonstrate improved UE strength as evidenced by ability to lift  rollator in/out of car for improved community access (for extended gait distances 1049f +)  Baseline:  Goal status: GOAL MET  5.  Will be compliant with advanced HEP to maintain functional gains and prevent recurrence of impairments  Baseline:  Goal status: ongoing/Modified Target date:  05/29/2022     PLAN: PT FREQUENCY: 1x/wk  PT DURATION: 4 weeks - 5 weeks   PLANNED INTERVENTIONS: Therapeutic exercises, Therapeutic activity, Neuromuscular re-education, Balance training, Gait training, Patient/Family education, Self Care, Joint mobilization, Stair training, DME instructions, Aquatic Therapy, Taping, Ionotophoresis '4mg'$ /ml Dexamethasone, Manual therapy, and Re-evaluation.  PLAN FOR NEXT SESSION: PN completed and will send recert.  Cont with core and LE strengthening, balance, mobility.  Perform steps ups on stairwell next visit.  Will decrease frequency to 1x/wk.      Selinda Michaels III PT, DPT 04/24/22 9:07 PM

## 2022-04-26 ENCOUNTER — Encounter (HOSPITAL_BASED_OUTPATIENT_CLINIC_OR_DEPARTMENT_OTHER): Payer: Medicare Other | Admitting: Physical Therapy

## 2022-04-27 LAB — BASIC METABOLIC PANEL
BUN/Creatinine Ratio: 19 (ref 10–24)
BUN: 26 mg/dL (ref 8–27)
CO2: 22 mmol/L (ref 20–29)
Calcium: 9.1 mg/dL (ref 8.6–10.2)
Chloride: 104 mmol/L (ref 96–106)
Creatinine, Ser: 1.39 mg/dL — ABNORMAL HIGH (ref 0.76–1.27)
Glucose: 162 mg/dL — ABNORMAL HIGH (ref 70–99)
Potassium: 4 mmol/L (ref 3.5–5.2)
Sodium: 142 mmol/L (ref 134–144)
eGFR: 52 mL/min/{1.73_m2} — ABNORMAL LOW (ref >59)

## 2022-04-27 LAB — PRO B NATRIURETIC PEPTIDE: NT-Pro BNP: 561 pg/mL — ABNORMAL HIGH (ref 0–486)

## 2022-04-30 ENCOUNTER — Telehealth: Payer: Self-pay

## 2022-04-30 NOTE — Telephone Encounter (Signed)
Left my chart message regarding lab results

## 2022-04-30 NOTE — Telephone Encounter (Signed)
Pt viewed test results on My Chart. Routed to PCP.

## 2022-05-01 ENCOUNTER — Ambulatory Visit (HOSPITAL_BASED_OUTPATIENT_CLINIC_OR_DEPARTMENT_OTHER): Payer: Medicare Other | Attending: Pain Medicine | Admitting: Physical Therapy

## 2022-05-01 ENCOUNTER — Encounter (HOSPITAL_BASED_OUTPATIENT_CLINIC_OR_DEPARTMENT_OTHER): Payer: Self-pay | Admitting: Physical Therapy

## 2022-05-01 DIAGNOSIS — M5459 Other low back pain: Secondary | ICD-10-CM | POA: Insufficient documentation

## 2022-05-01 DIAGNOSIS — R296 Repeated falls: Secondary | ICD-10-CM | POA: Diagnosis present

## 2022-05-01 DIAGNOSIS — R2681 Unsteadiness on feet: Secondary | ICD-10-CM | POA: Diagnosis present

## 2022-05-01 DIAGNOSIS — M6281 Muscle weakness (generalized): Secondary | ICD-10-CM | POA: Insufficient documentation

## 2022-05-01 NOTE — Therapy (Signed)
OUTPATIENT PHYSICAL THERAPY TREATMENT NOTE         Patient Name: Charles Lloyd. MRN: [id number] DOB:[birth year], 82 y.o., male Today's Date: 05/02/2022   PT End of Session - 05/01/22 1532     Visit Number 27    Number of Visits 30    Date for PT Re-Evaluation 05/29/22    Authorization Type [employer] MCR    PT Start Time [id number]    PT Stop Time [id number]    PT Time Calculation (min) 40 min    Equipment Utilized During Treatment Gait belt    Activity Tolerance Patient tolerated treatment well    Behavior During Therapy WFL for tasks assessed/performed                      Past Medical History:  Diagnosis Date   Acute tracheobronchitis 01/05/2019   B12 deficiency    Basal cell carcinoma (BCC) of left side of nose 01/28/2018   Cancer ([place])    Cardiomyopathy ([place]) 03/10/2018   With chronic atrial fibrillation   CHF (congestive heart failure) ([place]) 04/16/2013   Functional class II, ejection fraction 35-40%  Formatting of this note might be different from the original. Functional class II, ejection fraction 35-40%   Chronic anticoagulation    Chronic diastolic (congestive) heart failure ([place]) 04/16/2013   Functional class II, ejection fraction 35-40%  Last Assessment & Plan:  Clinically stable Volume well controlled meds reviewed   Chronic diastolic CHF (congestive heart failure) ([place]) 04/16/2013   Functional class II, ejection fraction 35-40%  Last Assessment & Plan:  Clinically stable Volume well controlled meds reviewed   Colon polyps 04/16/2013   Coronary artery disease involving native coronary artery of native heart without angina pectoris 04/05/2022   Myoview 05/25/2019: EF 60, normal perfusion, low risk CCTA (AF protocol) 04/25/20: CAC score 4242 (99th percentile)   Diabetic neuropathy (HCC)    Eosinophil count raised 02/17/2019   Essential hypertension 01/06/2010   Last Assessment & Plan:  Well controlled Continue med management   Gout    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  Steroid usage was discontinued several months ago.  Follow up with Dermatology as planned.   Hypercholesterolemia 01/06/2010   Last Assessment & Plan:  Repeat labs recommended   Hyperlipidemia associated with type 2 diabetes mellitus ([place]) 01/28/2018   Hypertensive heart disease with heart failure ([place]) 01/06/2010   Last Assessment & Plan:  Well controlled Continue med management   Hyponatremia 12/17/2018   Infected prosthetic knee joint ([place]) 06/24/2017   Last Assessment & Plan:  Id following ?ongoing abx managementy reported Request records   Infection of prosthetic right knee joint ([place])    Insomnia 03/04/2012   Grief with loss of wife 02/20/12  Last Assessment & Plan:  Improved sx  contineu xanax prn Se discussed   Lesion of liver 04/20/2019   -On cardiac CT 02/2019   Mild reactive airways disease    Neoplasm of prostate 04/16/2013   Neoplasm of prostate, malignant ([place]) 01/06/2010   [name] S/p radiation therapy   Last Assessment & Plan:  Repeat psa today No urinary sx Pt reported fatigue/poor appetite   On amiodarone therapy 09/07/2013   On continuous oral anticoagulation 01/28/2018   Other activity(E029.9) 04/20/2013   Formatting of this note might be different from the original. Transthoracic Echocardiogram-03/10/2013-[facility]: Normal left ventricular wall thickness and cavity size.  Global left  ventricular systolic function is moderately reduced.  The estimated ejection fraction is 35-40%.  The left atrium is moderately enlarged.  The right atrium is mildly enlarged.  No significant valvular    Overweight (BMI 25.0-29.9) 10/22/2016   Persistent atrial fibrillation ([place]) 04/16/2013   Last Assessment & Plan:  Rate controlled Continue med management eliquis for stroke preventino  Formatting of this note might be different from the original.  Drug  HX Current Rx Pre-ABL inefficacy Pre-ABL  intolerant Post-ABL inefficacy Post-ABL intolerant max dose/24h M/Y end comments  sotalol                  dofetilide                  flecainide                  propafenone                  am   S/P TKR (total knee replacement), bilateral    Secondary hypercoagulable state ([place]) 04/09/2019   Tinea cruris 04/16/2013   Type 2 diabetes mellitus with diabetic polyneuropathy, without long-term current use of insulin ([place]) 04/16/2013   Last Assessment & Plan:  Labs today Pt reports well controlled on ambulatory monitoring   Vitamin D deficiency 07/25/2018   Past Surgical History:  Procedure Laterality Date   ATRIAL FIBRILLATION ABLATION N/A 03/12/2019   Procedure: ATRIAL FIBRILLATION ABLATION;  Surgeon: [name], MD;  Location: [facility];  Service: Cardiovascular;  Laterality: N/A;   ATRIAL FIBRILLATION ABLATION N/A 04/29/2020   Procedure: ATRIAL FIBRILLATION ABLATION;  Surgeon: [name], MD;  Location: [facility];  Service: Cardiovascular;  Laterality: N/A;   CARDIAC ELECTROPHYSIOLOGY STUDY AND ABLATION     CARDIOVERSION     CARDIOVERSION N/A 10/10/2018   Procedure: CARDIOVERSION;  Surgeon: [name], MD;  Location: [facility];  Service: Cardiovascular;  Laterality: N/A;   CARDIOVERSION N/A 05/18/2020   Procedure: CARDIOVERSION;  Surgeon: [name], MD;  Location: [facility];  Service: Cardiovascular;  Laterality: N/A;   CARDIOVERSION N/A 12/09/2020   Procedure: CARDIOVERSION;  Surgeon: Lelon Perla, MD;  Location: [facility];  Service: Cardiovascular;  Laterality: N/A;   CHOLECYSTECTOMY     PROSTATECTOMY     REPLACEMENT TOTAL KNEE BILATERAL     Patient Active Problem List   Diagnosis Date Noted   Bilateral lower extremity edema 04/06/2022   Coronary artery disease involving native coronary artery of native heart without angina pectoris 04/05/2022   Gout 02/27/2022   Acute renal failure superimposed on stage 3b chronic kidney  disease ([place]) 11/16/2021   Uremia 11/16/2021   Gout due to renal impairment 10/17/2021   Pancytopenia ([place]) 08/05/2021   SIRS (systemic inflammatory response syndrome) ([place]) 08/04/2021   Chronic kidney disease, stage 3b ([place]) 12/30/2020   Multiple falls    Spinal stenosis    S/P TKR (total knee replacement), bilateral    Arthralgia 11/20/2019   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   Lesion of liver 04/20/2019   Secondary hypercoagulable state ([place]) 04/09/2019   Eosinophil count raised 02/17/2019   Acute tracheobronchitis 01/05/2019   Asthma    Vitamin D deficiency 07/25/2018   Chronic anticoagulation 03/06/2018   Hyperlipidemia associated with type 2 diabetes mellitus ([place]) 01/28/2018   Basal cell carcinoma (BCC) of left side of nose 01/28/2018   Infected prosthetic knee joint ([place]) 06/24/2017   B12 deficiency 10/22/2016   Overweight (BMI 25.0-29.9)  10/22/2016   Primary osteoarthritis of right knee 03/29/2015   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   Paroxysmal A-fib (HCC) 04/16/2013   Chronic heart failure with preserved ejection fraction (HFpEF) ([place]) 04/16/2013   Colon polyps 04/16/2013   NIDDM-2 with polyneuropathy and hyperglycemia 04/16/2013   Tinea cruris 04/16/2013   Diabetic neuropathy ([place]) 07/14/2012   Insomnia 03/04/2012   Hypertensive heart disease with heart failure ([place]) 01/06/2010   Neoplasm of prostate, malignant ([place]) 01/06/2010   Hypercholesterolemia 01/06/2010   Essential hypertension 01/06/2010    PCP: Isaac Bliss, [facility] Idler   REFERRING PROVIDER: Reece Agar, MD   REFERRING DIAG: [phone] (ICD-10-CM) - Spinal stenosis, lumbar region with neurogenic claudication   Rationale for Evaluation and Treatment Rehabilitation  THERAPY DIAG:  Unsteadiness on feet  Repeated falls  Muscle weakness (generalized)  Other low back pain  ONSET DATE: 11/15/2021   SUBJECTIVE:                                                                                                                                                                                            SUBJECTIVE STATEMENT: Pt states he feet felt great yesterday, but are numb today.  Pt states MD informed him to take the water pill once per day.  Pt states his swelling is much better.  Pt had a lumbar injection last week and is feeling better.  He is not having lumbar pain currently.  Pt reports improved balance.  Pt states he is walking more without the cane.         PERTINENT HISTORY:  Lumbar anterolisthesis, Peripheral neuropathy, TKR bilat, A-fib, CHF, DM  PAIN:  NPRS:  Current:  0/10, Worst:  5/10, Best:  0/10 Location:  central lumbar pain   Pt reports he has numbness in feet.     PRECAUTIONS: Fall and Other: severe neuropathy   WEIGHT BEARING RESTRICTIONS No  FALLS:  Has patient fallen in last 6 months? Yes. Number of falls 3.5 (caught himself on one fall, on the other 3 falls my legs just gave way like someone took my legs out from under me)  LIVING ENVIRONMENT: Lives with: lives alone and family is near by and can help him easily  Lives in: House/apartment Stairs:  two story townhouse with stair lift inside, no STE but does have to deal with one step going up to chair lift Has following equipment at home: Single point cane, Walker - 4 wheeled, and stair lift, raised toilet seats, shower stool   OCCUPATION: retired   PLOF: Independent, Independent with basic ADLs, and Requires assistive device for independence  PATIENT GOALS stop falling, try to  gain some strength   OBJECTIVE:   DIAGNOSTIC FINDINGS:  IMPRESSION: 1. 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 2. Stable to slight progression of severe spinal stenosis and moderate neural foraminal stenosis at L2-3. 3. Slight progression of moderate spinal stenosis at L3-4.    TODAY'S TREATMENT     Therapeutic Exercise:  Nustep x 5 mins with UE/LE's L4-5 Seated toe raises x20 reps Eversion AROM 3x10 bilat DF with  RTB 2x10 bilat Supine SLR 2x10 Lateral band walks with GTB around thighs with UE's on rail x 3 laps  Step ups on 1st step at stairwell with UE support on rail 2x10  Neuro re-ed Activities: Standing in staggered stance x 30 sec with CGA/min assist Tandem stance with CGA/min assist with UE support 2x30 sec each SLS 2x10-12 sec with UE support with CGA Tandem gait with UE support with CGA Stepping over 4 hurdles with reciprocal gait with CGA with UE support on rail    PATIENT EDUCATION:  Education details:  HEP, exercise form, exercise rationale, and POC.  Person educated: Patient Education method: Explanation, demonstration, verbal and tactile cues Education comprehension: verbalized understanding, returned demonstration, and verbal and visual cues required   HOME EXERCISE PROGRAM: Access Code: [email or web address] URL: [email or web address] Date: 12/01/2021 Prepared by: Ronny Flurry   ASSESSMENT:  CLINICAL IMPRESSION: Pt is progressing with functional mobility as evidenced by subjective reports including improved balance and ambulation.  Pt presents to Rx with reduced lumbar pain since having an injection last week.  Pt has not had any falls recently.  He continues to have limited DF bilat and PT worked improving DF ROM and strength today.  Pt is unable to perform SLS without using UE support.  Pt gives great effort with all exercises and performed exercises well.  Pt responded well to Rx stating he feels good after Rx.  He should benefit from cont skilled PT services to address ongoing goals, improve pain and strength, and to assist in maximizing functional mobility.    OBJECTIVE IMPAIRMENTS Abnormal gait, cardiopulmonary status limiting activity, decreased balance, decreased coordination, decreased knowledge of use of DME, decreased mobility, difficulty walking, decreased strength, decreased safety awareness, increased edema, and impaired UE functional use.   ACTIVITY  LIMITATIONS standing, squatting, stairs, transfers, bed mobility, reach over head, and locomotion level  PARTICIPATION LIMITATIONS: shopping, community activity, and yard work  PERSONAL FACTORS Age, Behavior pattern, Fitness, Past/current experiences, and Time since onset of injury/illness/exacerbation are also affecting patient's functional outcome.   REHAB POTENTIAL: Good  CLINICAL DECISION MAKING: Evolving/moderate complexity  EVALUATION COMPLEXITY: Moderate   GOALS: Goals reviewed with patient? Yes  SHORT TERM GOALS: Target date: 01/03/2022  Will be compliant with appropriate progressive HEP  Baseline:  GOAL MET Goal status:  ONGOING  2.  Will be able to name 3 ways to reduce fall risk at home and in community  Baseline:  Goal status: Discontinue goal  3.  Will demonstrate improved ankle dorsiflexor endurance and reduce related fall risk as evidenced by elimination of foot slap during 3MWT  Baseline:  Goal status: NOT MET    LONG TERM GOALS: Target date: 04/17/2022   MMT to improve by at least 1 grade in all weak groups  Baseline:  Goal status:  PARTIALLY MET Target date:  05/29/2022  2.  Will be able to ambulate at least 500 ft safely in 3MWT with use of rollator with no more than S level assist to improve community access  Baseline:  Goal status: NOT MET/modified  Target date:  05/29/2022   3.  Will score at least 37 on the [place] to show improved balance/reduced fall risk  Baseline:  Goal status:  GOAL MET  4.  Will demonstrate improved UE strength as evidenced by ability to lift rollator in/out of car for improved community access (for extended gait distances 1067f +) Baseline:  Goal status: GOAL MET  5.  Will be compliant with advanced HEP to maintain functional gains and prevent recurrence of impairments  Baseline:  Goal status: ongoing/Modified Target date:  05/29/2022     PLAN: PT FREQUENCY: 1x/wk  PT DURATION: 4 weeks - 5 weeks   PLANNED  INTERVENTIONS: Therapeutic exercises, Therapeutic activity, Neuromuscular re-education, Balance training, Gait training, Patient/Family education, Self Care, Joint mobilization, Stair training, DME instructions, Aquatic Therapy, Taping, Ionotophoresis '4mg'$ /ml Dexamethasone, Manual therapy, and Re-evaluation.  PLAN FOR NEXT SESSION: PN completed and will send recert.  Cont with core and LE strengthening, balance, mobility.  Perform steps ups on stairwell next visit.  Will decrease frequency to 1x/wk.      RSelinda MichaelsIII PT, DPT 05/02/22 2:28 PM

## 2022-05-08 ENCOUNTER — Ambulatory Visit (HOSPITAL_BASED_OUTPATIENT_CLINIC_OR_DEPARTMENT_OTHER): Payer: Medicare Other | Admitting: Physical Therapy

## 2022-05-08 DIAGNOSIS — R2681 Unsteadiness on feet: Secondary | ICD-10-CM | POA: Diagnosis not present

## 2022-05-08 DIAGNOSIS — M5459 Other low back pain: Secondary | ICD-10-CM

## 2022-05-08 DIAGNOSIS — M6281 Muscle weakness (generalized): Secondary | ICD-10-CM

## 2022-05-08 DIAGNOSIS — R296 Repeated falls: Secondary | ICD-10-CM

## 2022-05-08 NOTE — Therapy (Signed)
OUTPATIENT PHYSICAL THERAPY TREATMENT NOTE         Patient Name: Charles Lloyd. MRN: [id number] DOB:1940/09/02, 82 y.o., male Today's Date: 05/09/2022   PT End of Session - 05/08/22 1517     Visit Number 28    Number of Visits 30    Date for PT Re-Evaluation 05/29/22    Authorization Type [employer] MCR    PT Start Time [id number]    PT Stop Time 1534    PT Time Calculation (min) 38 min    Equipment Utilized During Treatment Gait belt    Activity Tolerance Patient tolerated treatment well    Behavior During Therapy WFL for tasks assessed/performed                      Past Medical History:  Diagnosis Date   Acute tracheobronchitis 01/05/2019   B12 deficiency    Basal cell carcinoma (BCC) of left side of nose 01/28/2018   Cancer ([place])    Cardiomyopathy ([place]) 03/10/2018   With chronic atrial fibrillation   CHF (congestive heart failure) ([place]) 04/16/2013   Functional class II, ejection fraction 35-40%  Formatting of this note might be different from the original. Functional class II, ejection fraction 35-40%   Chronic anticoagulation    Chronic diastolic (congestive) heart failure ([place]) 04/16/2013   Functional class II, ejection fraction 35-40%  Last Assessment & Plan:  Clinically stable Volume well controlled meds reviewed   Chronic diastolic CHF (congestive heart failure) ([place]) 04/16/2013   Functional class II, ejection fraction 35-40%  Last Assessment & Plan:  Clinically stable Volume well controlled meds reviewed   Colon polyps 04/16/2013   Coronary artery disease involving native coronary artery of native heart without angina pectoris 04/05/2022   Myoview 05/25/2019: EF 60, normal perfusion, low risk CCTA (AF protocol) 04/25/20: CAC score 4242 (99th percentile)   Diabetic neuropathy (HCC)    Eosinophil count raised 02/17/2019   Essential hypertension 01/06/2010   Last Assessment & Plan:  Well controlled Continue med management   Gout    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  Steroid usage was discontinued several months ago.  Follow up with Dermatology as planned.   Hypercholesterolemia 01/06/2010   Last Assessment & Plan:  Repeat labs recommended   Hyperlipidemia associated with type 2 diabetes mellitus ([place]) 01/28/2018   Hypertensive heart disease with heart failure ([place]) 01/06/2010   Last Assessment & Plan:  Well controlled Continue med management   Hyponatremia 12/17/2018   Infected prosthetic knee joint ([place]) 06/24/2017   Last Assessment & Plan:  Id following ?ongoing abx managementy reported Request records   Infection of prosthetic right knee joint ([place])    Insomnia 03/04/2012   Grief with loss of wife 02/20/12  Last Assessment & Plan:  Improved sx  contineu xanax prn Se discussed   Lesion of liver 04/20/2019   -On cardiac CT 02/2019   Mild reactive airways disease    Neoplasm of prostate 04/16/2013   Neoplasm of prostate, malignant ([place]) 01/06/2010   [name] S/p radiation therapy   Last Assessment & Plan:  Repeat psa today No urinary sx Pt reported fatigue/poor appetite   On amiodarone therapy 09/07/2013   On continuous oral anticoagulation 01/28/2018   Other activity(E029.9) 04/20/2013   Formatting of this note might be different from the original. Transthoracic Echocardiogram-03/10/[facility]: Normal left ventricular wall thickness and cavity size.  Global left  ventricular systolic function is moderately reduced.  The estimated ejection fraction is 35-40%.  The left atrium is moderately enlarged.  The right atrium is mildly enlarged.  No significant valvular    Overweight (BMI 25.0-29.9) 10/22/2016   Persistent atrial fibrillation ([place]) 04/16/2013   Last Assessment & Plan:  Rate controlled Continue med management eliquis for stroke preventino  Formatting of this note might be different from the original.  Drug  HX Current Rx Pre-ABL inefficacy Pre-ABL  intolerant Post-ABL inefficacy Post-ABL intolerant max dose/24h M/Y end comments  sotalol                  dofetilide                  flecainide                  propafenone                  am   S/P TKR (total knee replacement), bilateral    Secondary hypercoagulable state ([place]) 04/09/2019   Tinea cruris 04/16/2013   Type 2 diabetes mellitus with diabetic polyneuropathy, without long-term current use of insulin ([place]) 04/16/2013   Last Assessment & Plan:  Labs today Pt reports well controlled on ambulatory monitoring   Vitamin D deficiency 07/25/2018   Past Surgical History:  Procedure Laterality Date   ATRIAL FIBRILLATION ABLATION N/A 03/12/2019   Procedure: ATRIAL FIBRILLATION ABLATION;  Surgeon: [name], MD;  Location: [facility];  Service: Cardiovascular;  Laterality: N/A;   ATRIAL FIBRILLATION ABLATION N/A 04/29/2020   Procedure: ATRIAL FIBRILLATION ABLATION;  Surgeon: [name], MD;  Location: [facility];  Service: Cardiovascular;  Laterality: N/A;   CARDIAC ELECTROPHYSIOLOGY STUDY AND ABLATION     CARDIOVERSION     CARDIOVERSION N/A 10/10/2018   Procedure: CARDIOVERSION;  Surgeon: [name], MD;  Location: [facility];  Service: Cardiovascular;  Laterality: N/A;   CARDIOVERSION N/A 05/18/2020   Procedure: CARDIOVERSION;  Surgeon: [name], MD;  Location: [facility];  Service: Cardiovascular;  Laterality: N/A;   CARDIOVERSION N/A 12/09/2020   Procedure: CARDIOVERSION;  Surgeon: Lelon Perla, MD;  Location[facility];  Service: Cardiovascular;  Laterality: N/A;   CHOLECYSTECTOMY     PROSTATECTOMY     REPLACEMENT TOTAL KNEE BILATERAL     Patient Active Problem List   Diagnosis Date Noted   Bilateral lower extremity edema 04/06/2022   Coronary artery disease involving native coronary artery of native heart without angina pectoris 04/05/2022   Gout 02/27/2022   Acute renal failure superimposed on stage 3b chronic kidney  disease ([place]) 11/16/2021   Uremia 11/16/2021   Gout due to renal impairment 10/17/2021   Pancytopenia ([place]) 08/05/2021   SIRS (systemic inflammatory response syndrome) ([place]) 08/04/2021   Chronic kidney disease, stage 3b ([place]) 12/30/2020   Multiple falls    Spinal stenosis    S/P TKR (total knee replacement), bilateral    Arthralgia 11/20/2019   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   Lesion of liver 04/20/2019   Secondary hypercoagulable state ([place]) 04/09/2019   Eosinophil count raised 02/17/2019   Acute tracheobronchitis 01/05/2019   Asthma    Vitamin D deficiency 07/25/2018   Chronic anticoagulation 03/06/2018   Hyperlipidemia associated with type 2 diabetes mellitus ([place]) 01/28/2018   Basal cell carcinoma (BCC) of left side of nose 01/28/2018   Infected prosthetic knee joint ([place]) 06/24/2017   B12 deficiency 10/22/2016   Overweight (BMI 25.0-29.9)  10/22/2016   Primary osteoarthritis of right knee 03/29/2015   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   Paroxysmal A-fib (HCC) 04/16/2013   Chronic heart failure with preserved ejection fraction (HFpEF) ([place]) 04/16/2013   Colon polyps 04/16/2013   NIDDM-2 with polyneuropathy and hyperglycemia 04/16/2013   Tinea cruris 04/16/2013   Diabetic neuropathy ([place]) 07/14/2012   Insomnia 03/04/2012   Hypertensive heart disease with heart failure ([place]) 01/06/2010   Neoplasm of prostate, malignant ([place]) 01/06/2010   Hypercholesterolemia 01/06/2010   Essential hypertension 01/06/2010    PCP: Isaac Bliss, [facility] Idler   REFERRING PROVIDER: Reece Agar, MD   REFERRING DIAG: [phone] (ICD-10-CM) - Spinal stenosis, lumbar region with neurogenic claudication   Rationale for Evaluation and Treatment Rehabilitation  THERAPY DIAG:  Unsteadiness on feet  Repeated falls  Muscle weakness (generalized)  Other low back pain  ONSET DATE: 11/15/2021   SUBJECTIVE:                                                                                                                                                                                            SUBJECTIVE STATEMENT: Pt states his back started bothering him on Sunday.  He thinks the injection is wearing off.  He denies any adverse effects after prior Rx and states he typically feels better after PT.   Pt states his swelling is much better.  Pt reports improved balance.  Pt states he is walking more without the cane.         PERTINENT HISTORY:  Lumbar anterolisthesis, Peripheral neuropathy, TKR bilat, A-fib, CHF, DM  PAIN:  NPRS:  Current:  5/10, Worst:  5/10, Best:  0/10 Location:  central lumbar pain   Pt reports he has numbness in feet.     PRECAUTIONS: Fall and Other: severe neuropathy   WEIGHT BEARING RESTRICTIONS No  FALLS:  Has patient fallen in last 6 months? Yes. Number of falls 3.5 (caught himself on one fall, on the other 3 falls my legs just gave way like someone took my legs out from under me)  LIVING ENVIRONMENT: Lives with: lives alone and family is near by and can help him easily  Lives in: House/apartment Stairs:  two story townhouse with stair lift inside, no STE but does have to deal with one step going up to chair lift Has following equipment at home: Single point cane, Walker - 4 wheeled, and stair lift, raised toilet seats, shower stool   OCCUPATION: retired   PLOF: Independent, Independent with basic ADLs, and Requires assistive device for independence  PATIENT GOALS stop falling, try to gain some strength   OBJECTIVE:   DIAGNOSTIC FINDINGS:  IMPRESSION: 1. 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 2. Stable to slight progression of severe spinal stenosis and moderate neural foraminal stenosis at L2-3. 3. Slight progression of moderate spinal stenosis at L3-4.    TODAY'S TREATMENT     Therapeutic Exercise:  Nustep x 5 mins with UE/LE's L5 Seated toe raises x20 reps Supine SLR 2x10 Lateral band walks with GTB around thighs with UE's on  rail x 4 laps   Neuro re-ed Activities: Standing in staggered stance 2 x 30 sec with CGA/min assist with occasional UE support Tandem stance with CGA/min assist with UE support 2x30 sec each with occasional UE support SLS 2x10-12 sec with UE support with CGA Tandem gait with UE support with CGA  Therapeutic Activities: Stepping over 4 hurdles with step to and reciprocal gait with CGA with UE support on rail to improve gait and foot clearance Step ups on 1st step at stairwell with UE support on rail 2x10    PATIENT EDUCATION:  Education details:  HEP, exercise form, exercise rationale, and POC.  Person educated: Patient Education method: Explanation, demonstration, verbal and tactile cues Education comprehension: verbalized understanding, returned demonstration, and verbal and visual cues required   HOME EXERCISE PROGRAM: Access Code: [email or web address] URL: [email or web address] Date: 12/01/2021 Prepared by: Ronny Flurry   ASSESSMENT:  CLINICAL IMPRESSION: Pt presents to Rx reporting increased back pain and states the injections seems to be wearing off.  He is progressing with functional mobility as evidenced by subjective reports including improved balance and ambulation.  Pt gave great effort with exercises.  PT worked on balance and LE strengthening.  He requires UE support for the majority of balance exercises including SLS.  Pt is weaker on R LE with step ups at stairwell.  Pt was fatigued with Rx though had no increased lumbar pain after Rx.  He should benefit from cont skilled PT services to address ongoing goals, improve pain and strength, and to assist in maximizing functional mobility.     OBJECTIVE IMPAIRMENTS Abnormal gait, cardiopulmonary status limiting activity, decreased balance, decreased coordination, decreased knowledge of use of DME, decreased mobility, difficulty walking, decreased strength, decreased safety awareness, increased edema, and impaired UE  functional use.   ACTIVITY LIMITATIONS standing, squatting, stairs, transfers, bed mobility, reach over head, and locomotion level  PARTICIPATION LIMITATIONS: shopping, community activity, and yard work  PERSONAL FACTORS Age, Behavior pattern, Fitness, Past/current experiences, and Time since onset of injury/illness/exacerbation are also affecting patient's functional outcome.   REHAB POTENTIAL: Good  CLINICAL DECISION MAKING: Evolving/moderate complexity  EVALUATION COMPLEXITY: Moderate   GOALS: Goals reviewed with patient? Yes  SHORT TERM GOALS: Target date: 01/03/2022  Will be compliant with appropriate progressive HEP  Baseline:  GOAL MET Goal status:  ONGOING  2.  Will be able to name 3 ways to reduce fall risk at home and in community  Baseline:  Goal status: Discontinue goal  3.  Will demonstrate improved ankle dorsiflexor endurance and reduce related fall risk as evidenced by elimination of foot slap during 3MWT  Baseline:  Goal status: NOT MET    LONG TERM GOALS: Target date: 04/17/2022   MMT to improve by at least 1 grade in all weak groups  Baseline:  Goal status:  PARTIALLY MET Target date:  05/29/2022  2.  Will be able to ambulate at least 500 ft safely in 3MWT with use of rollator with no more than S level assist to improve community access  Baseline:  Goal status: NOT MET/modified Target  date:  05/29/2022   3.  Will score at least 37 on the [place] to show improved balance/reduced fall risk  Baseline:  Goal status:  GOAL MET  4.  Will demonstrate improved UE strength as evidenced by ability to lift rollator in/out of car for improved community access (for extended gait distances 1049f +) Baseline:  Goal status: GOAL MET  5.  Will be compliant with advanced HEP to maintain functional gains and prevent recurrence of impairments  Baseline:  Goal status: ongoing/Modified Target date:  05/29/2022     PLAN: PT FREQUENCY: 1x/wk  PT DURATION: 4 weeks -  5 weeks   PLANNED INTERVENTIONS: Therapeutic exercises, Therapeutic activity, Neuromuscular re-education, Balance training, Gait training, Patient/Family education, Self Care, Joint mobilization, Stair training, DME instructions, Aquatic Therapy, Taping, Ionotophoresis '4mg'$ /ml Dexamethasone, Manual therapy, and Re-evaluation.  PLAN FOR NEXT SESSION:  Cont with core and LE strengthening, balance, mobility.  Perform steps ups on stairwell next visit.  Work on [employer]      RSelinda MichaelsIII PT, DPT 05/09/22 9:59 PM

## 2022-05-09 ENCOUNTER — Encounter (HOSPITAL_BASED_OUTPATIENT_CLINIC_OR_DEPARTMENT_OTHER): Payer: Self-pay | Admitting: Physical Therapy

## 2022-05-15 ENCOUNTER — Ambulatory Visit (HOSPITAL_BASED_OUTPATIENT_CLINIC_OR_DEPARTMENT_OTHER): Payer: Medicare Other | Admitting: Physical Therapy

## 2022-05-16 ENCOUNTER — Other Ambulatory Visit: Payer: Self-pay | Admitting: Internal Medicine

## 2022-05-16 DIAGNOSIS — G6289 Other specified polyneuropathies: Secondary | ICD-10-CM

## 2022-05-22 ENCOUNTER — Ambulatory Visit (HOSPITAL_BASED_OUTPATIENT_CLINIC_OR_DEPARTMENT_OTHER): Payer: Medicare Other | Admitting: Physical Therapy

## 2022-05-22 DIAGNOSIS — M6281 Muscle weakness (generalized): Secondary | ICD-10-CM

## 2022-05-22 DIAGNOSIS — M5459 Other low back pain: Secondary | ICD-10-CM

## 2022-05-22 DIAGNOSIS — R2681 Unsteadiness on feet: Secondary | ICD-10-CM

## 2022-05-22 DIAGNOSIS — R296 Repeated falls: Secondary | ICD-10-CM

## 2022-05-22 NOTE — Therapy (Incomplete)
OUTPATIENT PHYSICAL THERAPY TREATMENT NOTE         Patient Name: Charles Lloyd. MRN: [id number] DOB:1940/09/02, 82 y.o., male Today's Date: 05/09/2022   PT End of Session - 05/08/22 1517     Visit Number 28    Number of Visits 30    Date for PT Re-Evaluation 05/29/22    Authorization Type [employer] MCR    PT Start Time [id number]    PT Stop Time 1534    PT Time Calculation (min) 38 min    Equipment Utilized During Treatment Gait belt    Activity Tolerance Patient tolerated treatment well    Behavior During Therapy WFL for tasks assessed/performed                      Past Medical History:  Diagnosis Date   Acute tracheobronchitis 01/05/2019   B12 deficiency    Basal cell carcinoma (BCC) of left side of nose 01/28/2018   Cancer ([place])    Cardiomyopathy ([place]) 03/10/2018   With chronic atrial fibrillation   CHF (congestive heart failure) ([place]) 04/16/2013   Functional class II, ejection fraction 35-40%  Formatting of this note might be different from the original. Functional class II, ejection fraction 35-40%   Chronic anticoagulation    Chronic diastolic (congestive) heart failure ([place]) 04/16/2013   Functional class II, ejection fraction 35-40%  Last Assessment & Plan:  Clinically stable Volume well controlled meds reviewed   Chronic diastolic CHF (congestive heart failure) ([place]) 04/16/2013   Functional class II, ejection fraction 35-40%  Last Assessment & Plan:  Clinically stable Volume well controlled meds reviewed   Colon polyps 04/16/2013   Coronary artery disease involving native coronary artery of native heart without angina pectoris 04/05/2022   Myoview 05/25/2019: EF 60, normal perfusion, low risk CCTA (AF protocol) 04/25/20: CAC score 4242 (99th percentile)   Diabetic neuropathy (HCC)    Eosinophil count raised 02/17/2019   Essential hypertension 01/06/2010   Last Assessment & Plan:  Well controlled Continue med management   Gout    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  Steroid usage was discontinued several months ago.  Follow up with Dermatology as planned.   Hypercholesterolemia 01/06/2010   Last Assessment & Plan:  Repeat labs recommended   Hyperlipidemia associated with type 2 diabetes mellitus ([place]) 01/28/2018   Hypertensive heart disease with heart failure ([place]) 01/06/2010   Last Assessment & Plan:  Well controlled Continue med management   Hyponatremia 12/17/2018   Infected prosthetic knee joint ([place]) 06/24/2017   Last Assessment & Plan:  Id following ?ongoing abx managementy reported Request records   Infection of prosthetic right knee joint ([place])    Insomnia 03/04/2012   Grief with loss of wife 02/20/12  Last Assessment & Plan:  Improved sx  contineu xanax prn Se discussed   Lesion of liver 04/20/2019   -On cardiac CT 02/2019   Mild reactive airways disease    Neoplasm of prostate 04/16/2013   Neoplasm of prostate, malignant ([place]) 01/06/2010   [name] S/p radiation therapy   Last Assessment & Plan:  Repeat psa today No urinary sx Pt reported fatigue/poor appetite   On amiodarone therapy 09/07/2013   On continuous oral anticoagulation 01/28/2018   Other activity(E029.9) 04/20/2013   Formatting of this note might be different from the original. Transthoracic Echocardiogram-03/10/[facility]: Normal left ventricular wall thickness and cavity size.  Global left  ventricular systolic function is moderately reduced.  The estimated ejection fraction is 35-40%.  The left atrium is moderately enlarged.  The right atrium is mildly enlarged.  No significant valvular    Overweight (BMI 25.0-29.9) 10/22/2016   Persistent atrial fibrillation ([place]) 04/16/2013   Last Assessment & Plan:  Rate controlled Continue med management eliquis for stroke preventino  Formatting of this note might be different from the original.  Drug  HX Current Rx Pre-ABL inefficacy Pre-ABL  intolerant Post-ABL inefficacy Post-ABL intolerant max dose/24h M/Y end comments  sotalol                  dofetilide                  flecainide                  propafenone                  am   S/P TKR (total knee replacement), bilateral    Secondary hypercoagulable state ([place]) 04/09/2019   Tinea cruris 04/16/2013   Type 2 diabetes mellitus with diabetic polyneuropathy, without long-term current use of insulin ([place]) 04/16/2013   Last Assessment & Plan:  Labs today Pt reports well controlled on ambulatory monitoring   Vitamin D deficiency 07/25/2018   Past Surgical History:  Procedure Laterality Date   ATRIAL FIBRILLATION ABLATION N/A 03/12/2019   Procedure: ATRIAL FIBRILLATION ABLATION;  Surgeon: [name], MD;  Location: [place];  Service: Cardiovascular;  Laterality: N/A;   ATRIAL FIBRILLATION ABLATION N/A 04/29/2020   Procedure: ATRIAL FIBRILLATION ABLATION;  Surgeon: [name], MD;  Location: [place] [facility];  Service: Cardiovascular;  Laterality: N/A;   CARDIAC ELECTROPHYSIOLOGY STUDY AND ABLATION     CARDIOVERSION     CARDIOVERSION N/A 10/10/2018   Procedure: CARDIOVERSION;  Surgeon: [name], MD;  Location: [place];  Service: Cardiovascular;  Laterality: N/A;   CARDIOVERSION N/A 05/18/2020   Procedure: CARDIOVERSION;  Surgeon: [name], MD;  Location: [facility];  Service: Cardiovascular;  Laterality: N/A;   CARDIOVERSION N/A 12/09/2020   Procedure: CARDIOVERSION;  Surgeon: Lelon Perla, MD;  Location[facility];  Service: Cardiovascular;  Laterality: N/A;   CHOLECYSTECTOMY     PROSTATECTOMY     REPLACEMENT TOTAL KNEE BILATERAL     Patient Active Problem List   Diagnosis Date Noted   Bilateral lower extremity edema 04/06/2022   Coronary artery disease involving native coronary artery of native heart without angina pectoris 04/05/2022   Gout 02/27/2022   Acute renal failure superimposed on stage 3b chronic kidney  disease ([place]) 11/16/2021   Uremia 11/16/2021   Gout due to renal impairment 10/17/2021   Pancytopenia ([place]) 08/05/2021   SIRS (systemic inflammatory response syndrome) ([place]) 08/04/2021   Chronic kidney disease, stage 3b ([place]) 12/30/2020   Multiple falls    Spinal stenosis    S/P TKR (total knee replacement), bilateral    Arthralgia 11/20/2019   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   Lesion of liver 04/20/2019   Secondary hypercoagulable state ([place]) 04/09/2019   Eosinophil count raised 02/17/2019   Acute tracheobronchitis 01/05/2019   Asthma    Vitamin D deficiency 07/25/2018   Chronic anticoagulation 03/06/2018   Hyperlipidemia associated with type 2 diabetes mellitus ([place]) 01/28/2018   Basal cell carcinoma (BCC) of left side of nose 01/28/2018   Infected prosthetic knee joint ([place]) 06/24/2017   B12 deficiency 10/22/2016   Overweight (BMI 25.0-29.9)  10/22/2016   Primary osteoarthritis of right knee 03/29/2015   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   Paroxysmal A-fib (HCC) 04/16/2013   Chronic heart failure with preserved ejection fraction (HFpEF) ([place]) 04/16/2013   Colon polyps 04/16/2013   NIDDM-2 with polyneuropathy and hyperglycemia 04/16/2013   Tinea cruris 04/16/2013   Diabetic neuropathy ([place]) 07/14/2012   Insomnia 03/04/2012   Hypertensive heart disease with heart failure ([place]) 01/06/2010   Neoplasm of prostate, malignant ([place]) 01/06/2010   Hypercholesterolemia 01/06/2010   Essential hypertension 01/06/2010    PCP: Isaac Bliss, [facility] Idler   REFERRING PROVIDER: Reece Agar, MD   REFERRING DIAG: [phone] (ICD-10-CM) - Spinal stenosis, lumbar region with neurogenic claudication   Rationale for Evaluation and Treatment Rehabilitation  THERAPY DIAG:  Unsteadiness on feet  Repeated falls  Muscle weakness (generalized)  Other low back pain  ONSET DATE: 11/15/2021   SUBJECTIVE:                                                                                                                                                                                            SUBJECTIVE STATEMENT: Pt states he thinks he has reached his plateau.  Pt reports having increased back pain after prior Rx.  He thinks performing the stairwell last Rx increased his back pain.  Pt states his pain goes away when he lies down.    Pt reports improved balance.  Pt is not using an AD much around his home.  Pt is able to stand in the shower now instead of sitting.  Pt states the last lumbar injection he had didn't last long.    states he typically feels better after PT.   Pt states his swelling is much better. Pt states he is walking more without the cane.         PERTINENT HISTORY:  Lumbar anterolisthesis, Peripheral neuropathy, TKR bilat, A-fib, CHF, DM  PAIN:  NPRS:  Current:  5/10, Worst:  5/10, Best:  0/10 Location:  central lumbar pain   Pt reports he has numbness in feet.     PRECAUTIONS: Fall and Other: severe neuropathy   WEIGHT BEARING RESTRICTIONS No  FALLS:  Has patient fallen in last 6 months? Yes. Number of falls 3.5 (caught himself on one fall, on the other 3 falls my legs just gave way like someone took my legs out from under me)  LIVING ENVIRONMENT: Lives with: lives alone and family is near by and can help him easily  Lives in: House/apartment Stairs:  two story townhouse with stair lift inside, no STE but does have to deal with one step going up to chair lift  Has following equipment at home: Single point cane, Walker - 4 wheeled, and stair lift, raised toilet seats, shower stool   OCCUPATION: retired   PLOF: Independent, Independent with basic ADLs, and Requires assistive device for independence  PATIENT GOALS stop falling, try to gain some strength   OBJECTIVE:   DIAGNOSTIC FINDINGS:  IMPRESSION: 1. 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 2. Stable to slight progression of severe spinal stenosis and moderate neural foraminal stenosis at  L2-3. 3. Slight progression of moderate spinal stenosis at L3-4.    TODAY'S TREATMENT    FOTO: 63 which has improved from 59. Goal of 64.   PT reviewed HEP.  PT advanced HEP and spent time going thru HEP educating pt in appropriate exercises.  PT updated HEP and gave pt an advanced HEP handout.  Educated pt in correct form and appropriate frequency.   Therapeutic Exercise:  Seated ankle eversion 2x10 bilat Supine SLR 2x10 on R and 1x10 on L Sidestepping with UE's on rail x 3 laps   Neuro re-ed Activities: Standing in staggered stance 2 x 30 sec independently without UE support Tandem gait with UE support with SBA x 2 laps     PATIENT EDUCATION:  Education details:  HEP, exercise form, exercise rationale, and POC.  Person educated: Patient Education method: Explanation, demonstration, verbal and tactile cues Education comprehension: verbalized understanding, returned demonstration, and verbal and visual cues required   HOME EXERCISE PROGRAM: Access Code: [email or web address] URL: [email or web address] Date: 12/01/2021 Prepared by: Ronny Flurry  Updated HEP: - Supine Active Straight Leg Raise  - 1 x daily - 5-6 x weekly - 2 sets - 10 reps - Side Stepping with Counter Support  - 1 x daily - 5-7 x weekly - 2-3 sets - 10 reps - Seated Ankle Alphabet  - 1 x daily - 7 x weekly - 2 sets - 10 reps - Romberg Stance  - 5-6 x weekly - 3 reps - 30 seconds hold - Standing in a Staggered Stance  - 1 x daily - 5-6 x weekly - 2 reps - 20 seconds hold   ASSESSMENT:  CLINICAL IMPRESSION: Pt presents to Rx reporting increased back pain and states the injections seems to be wearing off.  He is progressing with functional mobility as evidenced by subjective reports including improved balance and ambulation.  Pt gave great effort with exercises.  PT worked on balance and LE strengthening.  He requires UE support for the majority of balance exercises including SLS.  Pt is weaker on R LE  with step ups at stairwell.  Pt was fatigued with Rx though had no increased lumbar pain after Rx.  He should benefit from cont skilled PT services to address ongoing goals, improve pain and strength, and to assist in maximizing functional mobility.     OBJECTIVE IMPAIRMENTS Abnormal gait, cardiopulmonary status limiting activity, decreased balance, decreased coordination, decreased knowledge of use of DME, decreased mobility, difficulty walking, decreased strength, decreased safety awareness, increased edema, and impaired UE functional use.   ACTIVITY LIMITATIONS standing, squatting, stairs, transfers, bed mobility, reach over head, and locomotion level  PARTICIPATION LIMITATIONS: shopping, community activity, and yard work  PERSONAL FACTORS Age, Behavior pattern, Fitness, Past/current experiences, and Time since onset of injury/illness/exacerbation are also affecting patient's functional outcome.   REHAB POTENTIAL: Good  CLINICAL DECISION MAKING: Evolving/moderate complexity  EVALUATION COMPLEXITY: Moderate   GOALS: Goals reviewed with patient? Yes  SHORT TERM GOALS: Target date: 01/03/2022  Will be compliant with appropriate  progressive HEP  Baseline:  GOAL MET Goal status:  ONGOING  2.  Will be able to name 3 ways to reduce fall risk at home and in community  Baseline:  Goal status: Discontinue goal  3.  Will demonstrate improved ankle dorsiflexor endurance and reduce related fall risk as evidenced by elimination of foot slap during 3MWT  Baseline:  Goal status: NOT MET    LONG TERM GOALS: Target date: 04/17/2022   MMT to improve by at least 1 grade in all weak groups  Baseline:  Goal status:  PARTIALLY MET Target date:  05/29/2022  2.  Will be able to ambulate at least 500 ft safely in 3MWT with use of rollator with no more than S level assist to improve community access  Baseline:  Goal status: NOT MET/modified Target date:  05/29/2022   3.  Will score at least 37  on the Berg to show improved balance/reduced fall risk  Baseline:  Goal status:  GOAL MET  4.  Will demonstrate improved UE strength as evidenced by ability to lift rollator in/out of car for improved community access (for extended gait distances 1086ft +) Baseline:  Goal status: GOAL MET  5.  Will be compliant with advanced HEP to maintain functional gains and prevent recurrence of impairments  Baseline:  Goal status: ongoing/Modified Target date:  05/29/2022     PLAN: PT FREQUENCY: 1x/wk  PT DURATION: 4 weeks - 5 weeks   PLANNED INTERVENTIONS: Therapeutic exercises, Therapeutic activity, Neuromuscular re-education, Balance training, Gait training, Patient/Family education, Self Care, Joint mobilization, Stair training, DME instructions, Aquatic Therapy, Taping, Ionotophoresis 4mg /ml Dexamethasone, Manual therapy, and Re-evaluation.  PLAN FOR NEXT SESSION:  Cont with core and LE strengthening, balance, mobility.  Perform steps ups on stairwell next visit.  Work on [employer].      Selinda Michaels III PT, DPT 05/09/22 9:59 PM

## 2022-06-04 ENCOUNTER — Ambulatory Visit: Payer: Medicare Other | Admitting: Cardiology

## 2022-06-21 DIAGNOSIS — D62 Acute posthemorrhagic anemia: Secondary | ICD-10-CM | POA: Insufficient documentation

## 2022-06-26 ENCOUNTER — Ambulatory Visit: Payer: Medicare Other | Attending: Cardiology | Admitting: Cardiology

## 2022-06-26 ENCOUNTER — Encounter: Payer: Self-pay | Admitting: Cardiology

## 2022-06-26 VITALS — BP 168/80 | HR 78 | Ht 70.0 in | Wt 185.0 lb

## 2022-06-26 DIAGNOSIS — I5032 Chronic diastolic (congestive) heart failure: Secondary | ICD-10-CM

## 2022-06-26 DIAGNOSIS — E1169 Type 2 diabetes mellitus with other specified complication: Secondary | ICD-10-CM

## 2022-06-26 DIAGNOSIS — I2510 Atherosclerotic heart disease of native coronary artery without angina pectoris: Secondary | ICD-10-CM

## 2022-06-26 DIAGNOSIS — R0609 Other forms of dyspnea: Secondary | ICD-10-CM

## 2022-06-26 DIAGNOSIS — E785 Hyperlipidemia, unspecified: Secondary | ICD-10-CM

## 2022-06-26 MED ORDER — HYDRALAZINE HCL 25 MG PO TABS: 25.0000 mg | ORAL_TABLET | Freq: Four times a day (QID) | ORAL | 3 refills | Status: DC

## 2022-06-26 NOTE — Patient Instructions (Addendum)
Medication Instructions:   INCREASE: Hydralazine 25mg  1 tablet 4 times daily   Lab Work: None Ordered If you have labs (blood work) drawn today and your tests are completely normal, you will receive your results only by: MyChart Message (if you have MyChart) OR A paper copy in the mail If you have any lab test that is abnormal or we need to change your treatment, we will call you to review the results.   Testing/Procedures: Your physician has requested that you have an echocardiogram. Echocardiography is a painless test that uses sound waves to create images of your heart. It provides your doctor with information about the size and shape of your heart and how well your heart's chambers and valves are working. This procedure takes approximately one hour. There are no restrictions for this procedure. Please do NOT wear cologne, perfume, aftershave, or lotions (deodorant is allowed). Please arrive 15 minutes prior to your appointment time.    Follow-Up: At [facility], you and your health needs are our priority.  As part of our continuing mission to provide you with exceptional heart care, we have created designated Provider Care Teams.  These Care Teams include your primary Cardiologist (physician) and Advanced Practice Providers (APPs -  Physician Assistants and Nurse Practitioners) who all work together to provide you with the care you need, when you need it.  We recommend signing up for the patient portal called "MyChart".  Sign up information is provided on this After Visit Summary.  MyChart is used to connect with patients for Virtual Visits (Telemedicine).  Patients are able to view lab/test results, encounter notes, upcoming appointments, etc.  Non-urgent messages can be sent to your provider as well.   To learn more about what you can do with MyChart, go to [email or web address].    Your next appointment:   6 week(s)  The format for your next appointment:   In  Person  Provider:   Gypsy Balsam, MD    Other Instructions NA

## 2022-06-26 NOTE — Progress Notes (Signed)
Cardiology Office Note:    Date:  06/26/2022   ID:  Charles Lloyd., DOB 1941-02-13, MRN [id number]  PCP:  Philip Aspen, Limmie Patricia, MD  Cardiologist:  Gypsy Balsam, MD    Referring MD: Philip Aspen, Estel*   Chief Complaint  Patient presents with   Medication Management    History of Present Illness:    Dennard Lloyd. is a 82 y.o. male past medical history significant for paroxysmal atrial fibrillation status post ablation done in 2015 2021 2022.  Controlling control with rhythm strategy flecainide.  Anticoagulated Eliquis.  He was referred back to [place] because of some swelling of lower extremities as well as high blood pressure.  Overall seems to be doing fine.  He said he is getting gradually all.  Weak and tired trying to walk with some limitations.  Past Medical History:  Diagnosis Date   Acute tracheobronchitis 01/05/2019   B12 deficiency    Basal cell carcinoma (BCC) of left side of nose 01/28/2018   Cancer (HCC)    Cardiomyopathy (HCC) 03/10/2018   With chronic atrial fibrillation   CHF (congestive heart failure) (HCC) 04/16/2013   Functional class II, ejection fraction 35-40%  Formatting of this note might be different from the original. Functional class II, ejection fraction 35-40%   Chronic anticoagulation    Chronic diastolic (congestive) heart failure (HCC) 04/16/2013   Functional class II, ejection fraction 35-40%  Last Assessment & Plan:  Clinically stable Volume well controlled meds reviewed   Chronic diastolic CHF (congestive heart failure) (HCC) 04/16/2013   Functional class II, ejection fraction 35-40%  Last Assessment & Plan:  Clinically stable Volume well controlled meds reviewed   Colon polyps 04/16/2013   Coronary artery disease involving native coronary artery of native heart without angina pectoris 04/05/2022   Myoview 05/25/2019: EF 60, normal perfusion, low risk CCTA (AF protocol) 04/25/20: CAC score 4242 (99th percentile)   Diabetic  neuropathy (HCC)    Eosinophil count raised 02/17/2019   Essential hypertension 01/06/2010   Last Assessment & Plan:  Well controlled Continue med management   Gout    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  Steroid usage was discontinued several months ago.  Follow up with Dermatology as planned.   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   Last Assessment & Plan:  Id following ?ongoing abx managementy reported Request records   Infection of prosthetic right knee joint (HCC)    Insomnia 03/04/2012   Grief with loss of wife 02/20/12  Last Assessment & Plan:  Improved sx  contineu xanax prn Se discussed   Lesion of liver 04/20/2019   -On cardiac CT 02/2019   Mild reactive airways disease    Neoplasm of prostate 04/16/2013   Neoplasm of prostate, 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 Transthoracic Echocardiogram-03/10/[facility]: Normal left ventricular  wall thickness and cavity size.  Global left ventricular systolic function is moderately reduced.  The estimated ejection fraction is 35-40%.  The left atrium is moderately enlarged.  The right atrium is mildly enlarged.  No significant valvular    Overweight (BMI 25.0-29.9) 10/22/2016   Persistent atrial fibrillation (HCC) 04/16/2013   Last Assessment  & Plan:  Rate controlled Continue med management eliquis for stroke preventino  Formatting of this note might be different from the original.  Drug  HX Current Rx Pre-ABL inefficacy Pre-ABL intolerant Post-ABL inefficacy Post-ABL intolerant max dose/24h M/Y end comments  sotalol                  dofetilide                  flecainide                  propafenone                  am   S/P TKR (total knee replacement), bilateral    Secondary hypercoagulable state (HCC) 04/09/2019   Tinea cruris 04/16/2013   Type 2 diabetes mellitus with diabetic polyneuropathy, without long-term current use of insulin (HCC) 04/16/2013   Last Assessment & Plan:  Labs today Pt reports well controlled on ambulatory monitoring   Vitamin D deficiency 07/25/2018    Past Surgical History:  Procedure Laterality Date   ATRIAL FIBRILLATION ABLATION N/A 03/12/2019   Procedure: ATRIAL FIBRILLATION ABLATION;  Surgeon: Regan Lemming, MD;  Location: [facility] INVASIVE CV LAB;  Service: Cardiovascular;  Laterality: N/A;   ATRIAL FIBRILLATION ABLATION N/A 04/29/2020   Procedure: ATRIAL FIBRILLATION ABLATION;  Surgeon: Regan Lemming, MD;  Location: [facility] INVASIVE CV [facility];  Service: Cardiovascular;  Laterality: N/A;   CARDIAC ELECTROPHYSIOLOGY STUDY AND ABLATION     CARDIOVERSION     CARDIOVERSION N/A 10/10/2018   Procedure: CARDIOVERSION;  Surgeon: Wendall Stade, MD;  Location: [facility] ENDOSCOPY;  Service: Cardiovascular;  Laterality: N/A;   CARDIOVERSION N/A 05/18/2020   Procedure: CARDIOVERSION;  Surgeon: Vesta Mixer, MD;  Location: [facility] ENDOSCOPY;  Service: Cardiovascular;  Laterality: N/A;   CARDIOVERSION N/A 12/09/2020   Procedure: CARDIOVERSION;  Surgeon: Lewayne Bunting, MD;  Location: [facility] ENDOSCOPY;  Service: Cardiovascular;  Laterality: N/A;   CHOLECYSTECTOMY     PROSTATECTOMY     REPLACEMENT TOTAL KNEE BILATERAL      Current Medications: Current Meds  Medication Sig   ACCU-CHEK AVIVA PLUS test strip 1 EACH BY  OTHER ROUTE DAILY. DX E11.9 (Patient taking differently: 2 each by Other route daily. Dx E11.9)   albuterol (VENTOLIN HFA) 108 (90 Base) MCG/ACT inhaler Inhale 1-2 puffs into the lungs every 6 (six) hours as needed for wheezing or shortness of breath.   allopurinol (ZYLOPRIM) 300 MG tablet TAKE 1 TABLET BY MOUTH EVERY DAY   atorvastatin (LIPITOR) 20 MG tablet TAKE 1 TABLET DAILY (Patient taking differently: Take 20 mg by mouth daily.)   carvedilol (COREG) 3.125 MG tablet TAKE 1 TABLET BY MOUTH TWICE A DAY   colchicine 0.6 MG tablet Take 1 tablet (0.6 mg total) by mouth 2 (two) times daily.   Continuous Blood Gluc Receiver (FREESTYLE LIBRE 2 READER) DEVI 1 each by Does not apply route daily.   Continuous Blood Gluc Sensor (FREESTYLE LIBRE 2 SENSOR) MISC 1 each by Does not apply route daily.   ELIQUIS 5 MG TABS tablet TAKE 1 TABLET TWICE A DAY (Patient taking differently: Take  5 mg by mouth 2 (two) times daily.)   flecainide (TAMBOCOR) 100 MG tablet TAKE 1 TABLET BY MOUTH TWICE A DAY   fluticasone (FLONASE) 50 MCG/ACT nasal spray Place 2 sprays into both nostrils daily.   fluticasone furoate-vilanterol (BREO ELLIPTA) 100-25 MCG/INH AEPB Inhale 1 puff into the lungs daily.   gabapentin (NEURONTIN) 300 MG capsule TAKE 2 CAPSULES AT BEDTIME   hydrALAZINE (APRESOLINE) 25 MG tablet Take 1 tablet (25 mg total) by mouth 3 (three) times daily.   insulin glargine (LANTUS SOLOSTAR) 100 UNIT/ML Solostar Pen Inject 8 Units into the skin daily.   KLOR-CON M20 20 MEQ tablet TAKE 1 TABLET BY MOUTH EVERY DAY   montelukast (SINGULAIR) 10 MG tablet Take 10 mg by mouth at bedtime.    RESTASIS 0.05 % ophthalmic emulsion Place 1 drop into both eyes 2 (two) times daily as needed (dry eyes).   torsemide (DEMADEX) 20 MG tablet Take 1 tablet (20 mg total) by mouth daily. Start taking from Monday only     Allergies:   Patient has no known allergies.   Social History   Socioeconomic History   Marital status: Widowed     Spouse name: Not on file   Number of children: Not on file   Years of education: Not on file   Highest education level: Not on file  Occupational History   Not on file  Tobacco Use   Smoking status: Never    Passive exposure: Never   Smokeless tobacco: Never  Vaping Use   Vaping Use: Never used  Substance and Sexual Activity   Alcohol use: Yes    Alcohol/week: 14.0 standard drinks of alcohol    Types: 14 Glasses of wine per week   Drug use: Never   Sexual activity: Not Currently  Other Topics Concern   Not on file  Social History Narrative   Not on file   Social Determinants of Health   Financial Resource Strain: Not on file  Food Insecurity: No Food Insecurity (11/16/2021)   Hunger Vital Sign    Worried About Running Out of Food in the Last Year: Never true    Ran Out of Food in the Last Year: Never true  Transportation Needs: No Transportation Needs (11/16/2021)   PRAPARE - Administrator, Civil Service (Medical): No    Lack of Transportation (Non-Medical): No  Physical Activity: Not on file  Stress: Not on file  Social Connections: Not on file     Family History: The patient's family history includes Cancer in his father and mother; Depression in his father and mother; Early death in his father and mother. ROS:   Please see the history of present illness.    All 14 point review of systems negative except as described per history of present illness  EKGs/Labs/Other Studies Reviewed:      Recent Labs: 11/16/2021: Magnesium 2.2; TSH 1.234 01/26/2022: B Natriuretic Peptide 380.8 04/06/2022: ALT 28; Hemoglobin 11.5; Platelets 210 04/26/2022: BUN 26; Creatinine, Ser 1.39; NT-Pro BNP 561; Potassium 4.0; Sodium 142  Recent Lipid Panel    Component Value Date/Time   CHOL 120 05/20/2020 0914   TRIG 73 05/20/2020 0914   HDL 46 05/20/2020 0914   CHOLHDL 2.6 05/20/2020 0914   CHOLHDL 3 07/24/2018 1058   VLDL 25.0 07/24/2018 1058   LDLCALC 59 05/20/2020 0914     Physical Exam:    VS:  BP (!) 168/80 (BP Location: Left Arm, Patient Position: Sitting)   Pulse 78  Ht 5\' 10"  (1.778 m)   Wt 185 lb (83.9 kg)   SpO2 96%   BMI 26.54 kg/m     Wt Readings from Last 3 Encounters:  06/26/22 185 lb (83.9 kg)  04/06/22 184 lb 6.4 oz (83.6 kg)  03/02/22 181 lb (82.1 kg)     GEN:  Well nourished, well developed in no acute distress HEENT: Normal NECK: No JVD; No carotid bruits LYMPHATICS: No lymphadenopathy CARDIAC: RRR, no murmurs, no rubs, no gallops RESPIRATORY:  Clear to auscultation without rales, wheezing or rhonchi  ABDOMEN: Soft, non-tender, non-distended MUSCULOSKELETAL:  No edema; No deformity  SKIN: Warm and dry LOWER EXTREMITIES: no swelling NEUROLOGIC:  Alert and oriented x 3 PSYCHIATRIC:  Normal affect   ASSESSMENT:    1. Coronary artery disease involving native coronary artery of native heart without angina pectoris   2. Chronic heart failure with preserved ejection fraction (HFpEF) (HCC)   3. Hyperlipidemia associated with type 2 diabetes mellitus (HCC)    PLAN:    In order of problems listed above:  Essential hypertension seems to be uncontrolled.  However he describe also the fact that his blood pressure fluctuates a lot during the day there will be times that his blood pressure would be 120/70 and then at 180/100.  I will increase dose of his hydralazine for from 10 mg 3 times daily to 10 mg 4 times a day.  He does not mind to take the medication 3 times a day or 4 times a day. Chronic congestive heart failure last echocardiogram showed low normal ejection fraction will repeat echocardiogram to recheck left ventricle ejection fraction. Frequent ventricular ectopy look like does PVCs coming from left ventricle upper portion, will get echocardiogram again to reassess left ventricle ejection fraction the meantime we will continue with flecainide. Swelling of lower extremities most like related to amlodipine that has been  discontinued swelling is only minimal on physical exam today   Medication Adjustments/Labs and Tests Ordered: Current medicines are reviewed at length with the patient today.  Concerns regarding medicines are outlined above.  No orders of the defined types were placed in this encounter.  Medication changes: No orders of the defined types were placed in this encounter.   Signed, Georgeanna Lea, MD, [facility] 06/26/2022 1:53 PM    [facility]

## 2022-06-29 ENCOUNTER — Other Ambulatory Visit: Payer: Self-pay | Admitting: Internal Medicine

## 2022-07-12 ENCOUNTER — Ambulatory Visit: Payer: Medicare Other | Admitting: Family Medicine

## 2022-07-12 ENCOUNTER — Encounter: Payer: Self-pay | Admitting: Family Medicine

## 2022-07-12 VITALS — BP 134/72 | HR 64 | Temp 98.7°F | Wt 186.2 lb

## 2022-07-12 DIAGNOSIS — R6883 Chills (without fever): Secondary | ICD-10-CM | POA: Diagnosis not present

## 2022-07-12 DIAGNOSIS — E1122 Type 2 diabetes mellitus with diabetic chronic kidney disease: Secondary | ICD-10-CM

## 2022-07-12 DIAGNOSIS — Z8639 Personal history of other endocrine, nutritional and metabolic disease: Secondary | ICD-10-CM

## 2022-07-12 DIAGNOSIS — R630 Anorexia: Secondary | ICD-10-CM

## 2022-07-12 DIAGNOSIS — R5383 Other fatigue: Secondary | ICD-10-CM

## 2022-07-12 DIAGNOSIS — N1831 Chronic kidney disease, stage 3a: Secondary | ICD-10-CM | POA: Diagnosis not present

## 2022-07-12 DIAGNOSIS — Z794 Long term (current) use of insulin: Secondary | ICD-10-CM

## 2022-07-12 DIAGNOSIS — G4700 Insomnia, unspecified: Secondary | ICD-10-CM

## 2022-07-12 LAB — COMPREHENSIVE METABOLIC PANEL
ALT: 59 U/L — ABNORMAL HIGH (ref 0–53)
AST: 74 U/L — ABNORMAL HIGH (ref 0–37)
Albumin: 3.6 g/dL (ref 3.5–5.2)
Alkaline Phosphatase: 82 U/L (ref 39–117)
BUN: 17 mg/dL (ref 6–23)
CO2: 30 mEq/L (ref 19–32)
Calcium: 9.1 mg/dL (ref 8.4–10.5)
Chloride: 92 mEq/L — ABNORMAL LOW (ref 96–112)
Creatinine, Ser: 1.27 mg/dL (ref 0.40–1.50)
GFR: 53.73 mL/min — ABNORMAL LOW (ref >60.00)
Glucose, Bld: 104 mg/dL — ABNORMAL HIGH (ref 70–99)
Potassium: 3.6 mEq/L (ref 3.5–5.1)
Sodium: 129 mEq/L — ABNORMAL LOW (ref 135–145)
Total Bilirubin: 0.9 mg/dL (ref 0.2–1.2)
Total Protein: 6.1 g/dL (ref 6.0–8.3)

## 2022-07-12 LAB — POCT URINALYSIS DIPSTICK
Blood, UA: NEGATIVE
Glucose, UA: NEGATIVE
Ketones, UA: NEGATIVE
Leukocytes, UA: NEGATIVE
Nitrite, UA: NEGATIVE
Protein, UA: POSITIVE — AB
Spec Grav, UA: 1.015 (ref 1.010–1.025)
Urobilinogen, UA: NEGATIVE E.U./dL — AB
pH, UA: 6 (ref 5.0–8.0)

## 2022-07-12 LAB — CBC WITH DIFFERENTIAL/PLATELET
Basophils Absolute: 0 10*3/uL (ref 0.0–0.1)
Basophils Relative: 0.6 % (ref 0.0–3.0)
Eosinophils Absolute: 0.1 10*3/uL (ref 0.0–0.7)
Eosinophils Relative: 2.7 % (ref 0.0–5.0)
HCT: 34.4 % — ABNORMAL LOW (ref 39.0–52.0)
Hemoglobin: 11.4 g/dL — ABNORMAL LOW (ref 13.0–17.0)
Lymphocytes Relative: 21.1 % (ref 12.0–46.0)
Lymphs Abs: 1.1 10*3/uL (ref 0.7–4.0)
MCHC: 33.2 g/dL (ref 30.0–36.0)
MCV: 89.2 fl (ref 78.0–100.0)
Monocytes Absolute: 0.7 10*3/uL (ref 0.1–1.0)
Monocytes Relative: 12.9 % — ABNORMAL HIGH (ref 3.0–12.0)
Neutro Abs: 3.3 10*3/uL (ref 1.4–7.7)
Neutrophils Relative %: 62.7 % (ref 43.0–77.0)
Platelets: 184 10*3/uL (ref 150.0–400.0)
RBC: 3.85 Mil/uL — ABNORMAL LOW (ref 4.22–5.81)
RDW: 16.8 % — ABNORMAL HIGH (ref 11.5–15.5)
WBC: 5.3 10*3/uL (ref 4.0–10.5)

## 2022-07-12 LAB — TSH: TSH: 2.11 u[IU]/mL (ref 0.35–5.50)

## 2022-07-12 LAB — VITAMIN B12: Vitamin B-12: 336 pg/mL (ref 211–911)

## 2022-07-12 LAB — POC COVID19 BINAXNOW: SARS Coronavirus 2 Ag: NEGATIVE

## 2022-07-12 LAB — T4, FREE: Free T4: 1.32 ng/dL (ref 0.60–1.60)

## 2022-07-12 LAB — VITAMIN D 25 HYDROXY (VIT D DEFICIENCY, FRACTURES): VITD: 52.4 ng/mL (ref 30.00–100.00)

## 2022-07-12 LAB — HEMOGLOBIN A1C: Hgb A1c MFr Bld: 6.7 % — ABNORMAL HIGH (ref 4.6–6.5)

## 2022-07-12 NOTE — Patient Instructions (Signed)
Your urine sample did not have any signs of infection.  Orders for labs were placed to further evaluate your symptoms.  We can then give you further recommendations based on the labs.    If you feel like you are developing new symptoms are becoming worse please notify the clinic.  COVID testing was negative.

## 2022-07-12 NOTE — Progress Notes (Signed)
Established Patient Office Visit   Subjective  Patient ID: Charles Litalien., male    DOB: 1941/01/26  Age: 82 y.o. MRN: [id number]  Chief Complaint  Patient presents with   Insomnia    Not sleeping all night since Monday, has no energy. Feels cold also started on Monday. Not eating for last 3 days.     82 year old male with pmh sig for A-fib on chronic anticoagulation, HTN, HFpEF, CAD, asthma, allergies, HLD, DM2, neuropathy, OSA status post bilateral TKR's, CKD 3, history of prostate cancer, vitamin deficiencies, spinal stenosis, gout who is followed by Dr. Ardyth Harps and seen for acute concern.  Pt was fine over the weekend while [place].  Then developed decreased appetite, chills/feeling cold all the time, and insomnia x 3 days.  Lies in bed unable to fall asleep or waking up every few hrs.  Wearing sweater when everyone elese has on short sleeves.  Patient denies any changes in diet or medications.  Unsure of sick contacts.  Has not tried anything for symptoms.      ROS Negative unless stated above    Objective:     BP 134/72 (BP Location: Left Arm, Patient Position: Sitting, Cuff Size: Normal)   Pulse 64   Temp 98.7 F (37.1 C) (Oral)   Wt 186 lb 3.2 oz (84.5 kg)   SpO2 98%   BMI 26.72 kg/m    Physical Exam Constitutional:      General: He is not in acute distress.    Appearance: Normal appearance.  HENT:     Head: Normocephalic and atraumatic.     Nose: Nose normal.     Mouth/Throat:     Mouth: Mucous membranes are moist.  Eyes:     Extraocular Movements: Extraocular movements intact.     Conjunctiva/sclera: Conjunctivae normal.  Cardiovascular:     Rate and Rhythm: Normal rate. Rhythm regularly irregular.     Heart sounds: Normal heart sounds. No murmur heard.    No gallop.  Pulmonary:     Effort: Pulmonary effort is normal. No respiratory distress.     Breath sounds: Normal breath sounds. No wheezing, rhonchi or rales.   Lymphadenopathy:     Cervical: No cervical adenopathy.  Skin:    General: Skin is warm and dry.  Neurological:     Mental Status: He is alert and oriented to person, place, and time.      Results for orders placed or performed in visit on 07/12/22  POC COVID-19  Result Value Ref Range   SARS Coronavirus 2 Ag Negative Negative      Assessment & Plan:  Chills -     POC COVID-19 BinaxNow -     POCT Urinalysis Dipstick (Automated) -     POCT urinalysis dipstick -     CBC with Differential/Platelet -     TSH  Fatigue, unspecified type -     POC COVID-19 BinaxNow -     POCT urinalysis dipstick -     CBC with Differential/Platelet -     TSH -     Comprehensive metabolic panel -     VITAMIN D 25 Hydroxy (Vit-D Deficiency, Fractures) -     Vitamin B12 -     Hemoglobin A1c -     T4, free  Insomnia, unspecified type -     TSH -     T4, free  Type 2 diabetes mellitus with stage 3a chronic kidney disease, with long-term  current use of insulin (HCC) -     Comprehensive metabolic panel -     Hemoglobin A1c  History of vitamin D deficiency -     VITAMIN D 25 Hydroxy (Vit-D Deficiency, Fractures)  Decreased appetite  Patient advised symptoms may be 2/2 anemia, viral etiology, UTI, atypical pneumonia, vitamin deficiency, thyroid dysfunction.  Will obtain labs to rule out above.  UA negative.  Discussed supportive care.  Further recommendations based on labs.  Given strict precautions.  Return if symptoms worsen or fail to improve.   Deeann Saint, MD

## 2022-07-16 ENCOUNTER — Other Ambulatory Visit: Payer: Self-pay | Admitting: Family Medicine

## 2022-07-16 DIAGNOSIS — R5383 Other fatigue: Secondary | ICD-10-CM

## 2022-07-16 DIAGNOSIS — E871 Hypo-osmolality and hyponatremia: Secondary | ICD-10-CM

## 2022-07-16 DIAGNOSIS — R7989 Other specified abnormal findings of blood chemistry: Secondary | ICD-10-CM

## 2022-07-17 ENCOUNTER — Other Ambulatory Visit (INDEPENDENT_AMBULATORY_CARE_PROVIDER_SITE_OTHER): Payer: Medicare Other

## 2022-07-17 ENCOUNTER — Telehealth: Payer: Self-pay | Admitting: Internal Medicine

## 2022-07-17 DIAGNOSIS — R7989 Other specified abnormal findings of blood chemistry: Secondary | ICD-10-CM | POA: Diagnosis not present

## 2022-07-17 DIAGNOSIS — E871 Hypo-osmolality and hyponatremia: Secondary | ICD-10-CM | POA: Diagnosis not present

## 2022-07-17 DIAGNOSIS — R5383 Other fatigue: Secondary | ICD-10-CM

## 2022-07-17 LAB — COMPREHENSIVE METABOLIC PANEL
ALT: 43 U/L (ref 0–53)
AST: 46 U/L — ABNORMAL HIGH (ref 0–37)
Albumin: 3.8 g/dL (ref 3.5–5.2)
Alkaline Phosphatase: 85 U/L (ref 39–117)
BUN: 20 mg/dL (ref 6–23)
CO2: 28 mEq/L (ref 19–32)
Calcium: 9.4 mg/dL (ref 8.4–10.5)
Chloride: 95 mEq/L — ABNORMAL LOW (ref 96–112)
Creatinine, Ser: 1.11 mg/dL (ref 0.40–1.50)
GFR: 63.15 mL/min (ref >60.00)
Glucose, Bld: 136 mg/dL — ABNORMAL HIGH (ref 70–99)
Potassium: 3.3 mEq/L — ABNORMAL LOW (ref 3.5–5.1)
Sodium: 132 mEq/L — ABNORMAL LOW (ref 135–145)
Total Bilirubin: 1.1 mg/dL (ref 0.2–1.2)
Total Protein: 6.2 g/dL (ref 6.0–8.3)

## 2022-07-17 LAB — CBC WITH DIFFERENTIAL/PLATELET
Basophils Absolute: 0 10*3/uL (ref 0.0–0.1)
Basophils Relative: 0.8 % (ref 0.0–3.0)
Eosinophils Absolute: 0.2 10*3/uL (ref 0.0–0.7)
Eosinophils Relative: 3.3 % (ref 0.0–5.0)
HCT: 35.5 % — ABNORMAL LOW (ref 39.0–52.0)
Hemoglobin: 11.5 g/dL — ABNORMAL LOW (ref 13.0–17.0)
Lymphocytes Relative: 21.9 % (ref 12.0–46.0)
Lymphs Abs: 1 10*3/uL (ref 0.7–4.0)
MCHC: 32.4 g/dL (ref 30.0–36.0)
MCV: 89.1 fl (ref 78.0–100.0)
Monocytes Absolute: 0.6 10*3/uL (ref 0.1–1.0)
Monocytes Relative: 12.8 % — ABNORMAL HIGH (ref 3.0–12.0)
Neutro Abs: 2.9 10*3/uL (ref 1.4–7.7)
Neutrophils Relative %: 61.2 % (ref 43.0–77.0)
Platelets: 194 10*3/uL (ref 150.0–400.0)
RBC: 3.99 Mil/uL — ABNORMAL LOW (ref 4.22–5.81)
RDW: 17 % — ABNORMAL HIGH (ref 11.5–15.5)
WBC: 4.7 10*3/uL (ref 4.0–10.5)

## 2022-07-17 NOTE — Telephone Encounter (Signed)
Pt came in and stated that he is eating better, however he is not sleeping. He states he is only getting about an hour or two of sleep at a time. He would like [facility] informed of his symptoms.

## 2022-07-17 NOTE — Telephone Encounter (Signed)
Spoke to patient and he states that the message was intended for Dr Salomon Fick.

## 2022-07-18 LAB — IRON,TIBC AND FERRITIN PANEL
%SAT: 14 % (calc) — ABNORMAL LOW (ref 20–48)
Ferritin: 4 ng/mL — ABNORMAL LOW (ref 24–380)
Iron: 52 ug/dL (ref 50–180)
TIBC: 384 mcg/dL (calc) (ref 250–425)

## 2022-07-19 ENCOUNTER — Ambulatory Visit (HOSPITAL_BASED_OUTPATIENT_CLINIC_OR_DEPARTMENT_OTHER)
Admission: RE | Admit: 2022-07-19 | Discharge: 2022-07-19 | Disposition: A | Discharge status: Home / Self Care | Payer: Medicare Other | Source: Ambulatory Visit | Attending: Cardiology | Admitting: Cardiology

## 2022-07-19 DIAGNOSIS — R600 Localized edema: Secondary | ICD-10-CM | POA: Diagnosis not present

## 2022-07-19 DIAGNOSIS — R0609 Other forms of dyspnea: Secondary | ICD-10-CM | POA: Diagnosis present

## 2022-07-19 LAB — ECHOCARDIOGRAM COMPLETE
AR max vel: 2.05 cm2
AV Area VTI: 1.87 cm2
AV Area mean vel: 1.82 cm2
AV Mean grad: 4 mmHg
AV Peak grad: 6.6 mmHg
Ao pk vel: 1.28 m/s
Area-P 1/2: 3.83 cm2
Est EF: 55
S' Lateral: 4.1 cm

## 2022-07-19 MED ORDER — PERFLUTREN LIPID MICROSPHERE
1.0000 mL | INTRAVENOUS | Status: AC | PRN
  Administered 2022-07-19: 3 mL via INTRAVENOUS

## 2022-07-20 ENCOUNTER — Other Ambulatory Visit: Payer: Self-pay | Admitting: Internal Medicine

## 2022-07-20 DIAGNOSIS — M1A0411 Idiopathic chronic gout, right hand, with tophus (tophi): Secondary | ICD-10-CM

## 2022-07-26 ENCOUNTER — Telehealth: Payer: Self-pay

## 2022-07-26 NOTE — Telephone Encounter (Signed)
   Pre-operative Risk Assessment    Patient Name: Charles Lloyd.  DOB: 08-16-1940 MRN: [id number]      Request for Surgical Clearance    Procedure:   LESI L5-S1  Date of Surgery:  Clearance TBD                                 Surgeon:   Surgeon's Group or Practice Name:  [facility] Phone number:  [phone] Fax number:  [phone]   Type of Clearance Requested:   - Pharmacy:  Hold Apixaban (Eliquis) 3 days prior to procedure and may resume the morning after procedure   Type of Anesthesia:  Not Indicated   Additional requests/questions:    Signed, Dione Housekeeper   07/26/2022, 5:02 PM

## 2022-07-29 NOTE — Telephone Encounter (Signed)
Patient with diagnosis of atrial fibrillation on Eliquis for anticoagulation.    Procedure:   LESI L5-S1   Date of Surgery:  Clearance TBD       CHA2DS2-VASc Score = 6   This indicates a 9.7% annual risk of stroke. The patient's score is based upon: CHF History: 1 HTN History: 1 Diabetes History: 1 Stroke History: 0 Vascular Disease History: 1 Age Score: 2 Gender Score: 0    CrCl 64 Platelet count 194  Per office protocol, patient can hold Eliquis for 3 days prior to procedure.   Patient will not need bridging with Lovenox (enoxaparin) around procedure.  **This guidance is not considered finalized until pre-operative APP has relayed final recommendations.**

## 2022-07-30 NOTE — Telephone Encounter (Signed)
Dr. Bing Matter,   You saw this patient on 06/26/2022. Will you please comment on medical clearance for LESI L5-S1? Pharmacy has recommended holding Eliquis for 3 days prior without Lovenox bridge.   Please route your response to P CV DIV Preop. I will communicate with requesting office once you have given recommendations.   Thank you!  Carlos Levering, NP

## 2022-08-03 ENCOUNTER — Telehealth: Payer: Self-pay | Admitting: Internal Medicine

## 2022-08-03 DIAGNOSIS — G6289 Other specified polyneuropathies: Secondary | ICD-10-CM

## 2022-08-03 MED ORDER — GABAPENTIN 300 MG PO CAPS
600.0000 mg | ORAL_CAPSULE | Freq: Every day | ORAL | 0 refills | Status: DC
Start: 2022-08-03 — End: 2022-10-01

## 2022-08-03 NOTE — Telephone Encounter (Signed)
Prescription Request  08/03/2022  LOV: 02/01/2022  What is the name of the medication or equipment? Gabapentin. Pt states he only has 4 pills left. Pt states he [facility], however they won't send out his refill until next week and he needs a refill before then.   Have you contacted your pharmacy to request a refill? Yes   Which pharmacy would you like this sent to? [id number] [place], [place] - [name] [place] [address] Phone: [phone] Fax: [phone]  Patient notified that their request is being sent to the clinical staff for review and that they should receive a response within 2 business days.   Please advise at Mobile [phone] (mobile)

## 2022-08-03 NOTE — Telephone Encounter (Signed)
Rx done. 

## 2022-08-15 NOTE — Telephone Encounter (Signed)
   Name: Kasten Uehling.  DOB: 22-Feb-1941  MRN: [id number]  Primary Cardiologist: Norman Herrlich, MD  Chart reviewed as part of pre-operative protocol coverage. The patient has an upcoming visit scheduled with Dr. Bing Matter on 08/27/2022 at which time clearance can be addressed in case there are any issues that would impact surgical recommendations.  LESI L5-S1 is not scheduled until TBD as below. I added preop FYI to appointment note so that provider is aware to address at time of outpatient visit.  Per office protocol the cardiology provider should forward their finalized clearance decision and recommendations regarding antiplatelet therapy to the requesting party below.    Per office protocol, patient can hold Eliquis for 3 days prior to procedure.   Patient will not need bridging with Lovenox (enoxaparin) around procedure.  I will route this message as FYI to requesting party and remove this message from the preop box as separate preop APP input not needed at this time.   Please call with any questions.  Joylene Grapes, NP  08/15/2022, 3:38 PM

## 2022-08-18 ENCOUNTER — Other Ambulatory Visit: Payer: Self-pay | Admitting: Internal Medicine

## 2022-08-22 ENCOUNTER — Ambulatory Visit: Payer: Medicare Other | Admitting: Family Medicine

## 2022-08-22 ENCOUNTER — Encounter: Payer: Self-pay | Admitting: Family Medicine

## 2022-08-22 VITALS — BP 154/70 | HR 80 | Temp 98.1°F | Ht 70.0 in | Wt 175.2 lb

## 2022-08-22 DIAGNOSIS — R059 Cough, unspecified: Secondary | ICD-10-CM | POA: Diagnosis not present

## 2022-08-22 DIAGNOSIS — J219 Acute bronchiolitis, unspecified: Secondary | ICD-10-CM

## 2022-08-22 LAB — POC COVID19 BINAXNOW: SARS Coronavirus 2 Ag: NEGATIVE

## 2022-08-22 MED ORDER — DOXYCYCLINE HYCLATE 100 MG PO CAPS: 100.0000 mg | ORAL_CAPSULE | Freq: Two times a day (BID) | ORAL | 0 refills | Status: DC

## 2022-08-22 NOTE — Progress Notes (Signed)
Established Patient Office Visit  Subjective   Patient ID: Carolos Lloyd., male    DOB: 01/31/1941  Age: 82 y.o. MRN: [id number]  Chief Complaint  Patient presents with   Nasal Congestion    Patient complains of congestion, x4 days   Cough    Patient complains of cough, x4 days, Productive with yellow sputum     HPI   Charles Lloyd has chronic problems including history of atrial fibrillation, hypertension, CAD, type 2 diabetes, hyperlipidemia, osteoarthritis involving multiple joints, chronic kidney disease.  Seen today with 4-day history of some nasal congestion and cough.  Cough now productive of yellow sputum.  No fever.  No dyspnea.  Feels fairly well overall.  No sick exposures.  Has had similar upper respiratory infections multiple times in the past.  Denies any sore throat.  No hemoptysis.  No recent appetite or weight changes.  COVID test here earlier today negative  Past Medical History:  Diagnosis Date   Acute tracheobronchitis 01/05/2019   B12 deficiency    Basal cell carcinoma (BCC) of left side of nose 01/28/2018   Cancer (HCC)    Cardiomyopathy (HCC) 03/10/2018   With chronic atrial fibrillation   CHF (congestive heart failure) (HCC) 04/16/2013   Functional class II, ejection fraction 35-40%  Formatting of this note might be different from the original. Functional class II, ejection fraction 35-40%   Chronic anticoagulation    Chronic diastolic (congestive) heart failure (HCC) 04/16/2013   Functional class II, ejection fraction 35-40%  Last Assessment & Plan:  Clinically stable Volume well controlled meds reviewed   Chronic diastolic CHF (congestive heart failure) (HCC) 04/16/2013   Functional class II, ejection fraction 35-40%  Last Assessment & Plan:  Clinically stable Volume well controlled meds reviewed   Colon polyps 04/16/2013   Coronary artery disease involving native coronary artery of native heart without angina pectoris 04/05/2022   Myoview 05/25/2019:  EF 60, normal perfusion, low risk CCTA (AF protocol) 04/25/20: CAC score 4242 (99th percentile)   Diabetic neuropathy (HCC)    Eosinophil count raised 02/17/2019   Essential hypertension 01/06/2010   Last Assessment & Plan:  Well controlled Continue med management   Gout    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  Steroid usage was discontinued several months ago.  Follow up with Dermatology as planned.   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   Last Assessment & Plan:  Id following ?ongoing abx managementy reported Request records   Infection of prosthetic right knee joint (HCC)    Insomnia 03/04/2012   Grief with loss of wife 02/20/12  Last Assessment & Plan:  Improved sx  contineu xanax prn Se discussed   Lesion of liver 04/20/2019   -On cardiac CT 02/2019   Mild reactive airways disease    Neoplasm of prostate 04/16/2013   Neoplasm of prostate, malignant (HCC) 01/06/2010   Darin Engels S/p radiation therapy   Last Assessment & Plan:  Repeat psa today No urinary sx Pt reported fatigue/poor appetite   On amiodarone therapy 09/07/2013   On continuous oral anticoagulation 01/28/2018   Other activity(E029.9) 04/20/2013   Formatting of  this note might be different from the original. Transthoracic Echocardiogram-03/10/2013-[facility]: Normal left ventricular wall thickness and cavity size.  Global left ventricular systolic function is moderately reduced.  The estimated ejection fraction is 35-40%.  The left atrium is moderately enlarged.  The right atrium is mildly enlarged.  No significant valvular     Overweight (BMI 25.0-29.9) 10/22/2016   Persistent atrial fibrillation (HCC) 04/16/2013   Last Assessment & Plan:  Rate controlled Continue med management eliquis for stroke preventino  Formatting of this note might be different from the original.  Drug  HX Current Rx Pre-ABL inefficacy Pre-ABL intolerant Post-ABL inefficacy Post-ABL intolerant max dose/24h M/Y end comments  sotalol                  dofetilide                  flecainide                  propafenone                  am   S/P TKR (total knee replacement), bilateral    Secondary hypercoagulable state (HCC) 04/09/2019   Tinea cruris 04/16/2013   Type 2 diabetes mellitus with diabetic polyneuropathy, without long-term current use of insulin (HCC) 04/16/2013   Last Assessment & Plan:  Labs today Pt reports well controlled on ambulatory monitoring   Vitamin D deficiency 07/25/2018   Past Surgical History:  Procedure Laterality Date   ATRIAL FIBRILLATION ABLATION N/A 03/12/2019   Procedure: ATRIAL FIBRILLATION ABLATION;  Surgeon: Regan Lemming, MD;  Location: [facility];  Service: Cardiovascular;  Laterality: N/A;   ATRIAL FIBRILLATION ABLATION N/A 04/29/2020   Procedure: ATRIAL FIBRILLATION ABLATION;  Surgeon: Regan Lemming, MD;  Location: [facility];  Service: Cardiovascular;  Laterality: N/A;   CARDIAC ELECTROPHYSIOLOGY STUDY AND ABLATION     CARDIOVERSION     CARDIOVERSION N/A 10/10/2018   Procedure: CARDIOVERSION;  Surgeon: Wendall Stade, MD;  Location[facility] END[facility]COPY;  Service: Cardiovascular;  Laterality: N/A;   CARDIOVERSION N/A 05/18/2020   Procedure: CARDIOVERSION;  Surgeon: Vesta Mixer, MD;  Location: [facility] ENDOSCOPY;  Service: Cardiovascular;  Laterality: N/A;   CARDIOVERSION N/A 12/09/2020   Procedure: CARDIOVERSION;  Surgeon: Lewayne Bunting, MD;  Location: [facility] ENDOSCOPY;  Service: Cardiovascular;  Laterality: N/A;   CHOLECYSTECTOMY     PROSTATECTOMY     REPLACEMENT TOTAL KNEE BILATERAL       reports that he has never smoked. He has never been exposed to tobacco smoke. He has never used smokeless tobacco. He reports current alcohol use of about 14.0 standard drinks of alcohol per week. He reports that he does not use drugs. family history includes Cancer in his father and mother; Depression in his father and mother; Early death in his father and mother. No Known Allergies  Review of Systems  Constitutional:  Negative for chills, fever and weight loss.  HENT:  Negative for sore throat.   Respiratory:  Positive for cough and sputum production. Negative for hemoptysis, shortness of breath and wheezing.       Objective:     BP (!) 154/70 (BP Location: Right Arm, Cuff Size: Normal)   Pulse 80   Temp 98.1 F (36.7 C) (Oral)   Ht 5\' 10"  (1.778 m)   Wt 175 lb 3.2 oz (79.5 kg)   SpO2 96%   BMI 25.14 kg/m  BP Readings from  Last 3 Encounters:  08/22/22 (!) 154/70  07/12/22 134/72  06/26/22 (!) 168/80   Wt Readings from Last 3 Encounters:  08/22/22 175 lb 3.2 oz (79.5 kg)  07/12/22 186 lb 3.2 oz (84.5 kg)  06/26/22 185 lb (83.9 kg)      Physical Exam Vitals reviewed.  Constitutional:      General: He is not in acute distress.    Appearance: Normal appearance. He is not toxic-appearing.  HENT:     Ears:     Comments: Cerumen impaction right canal.  Left canal is clear Cardiovascular:     Rate and Rhythm: Normal rate.  Pulmonary:     Effort: Pulmonary effort is normal.     Breath sounds: Normal breath sounds. No wheezing or rales.  Neurological:     Mental Status: He is alert.      Results for orders placed or performed in visit on 08/22/22  POC COVID-19  Result Value Ref Range   SARS Coronavirus 2 Ag Negative Negative      The ASCVD Risk score (Arnett DK, et al., 2019) failed to calculate for the following reasons:   The valid total cholesterol range is 130 to 320 mg/dL    Assessment & Plan:   Problem List Items Addressed This Visit    None Visit Diagnoses     Cough, unspecified type    -  Primary   Relevant Orders   POC COVID-19 (Completed)     Patient presents with 4-day history of nasal congestion and cough.  Suspect acute viral bronchitis.  Nonfocal exam.  No fever.  No respiratory distress.  We explained that most acute bronchitis is viral and will eventually run its course.  COVID test here today negative.  -Consider over-the-counter plain Mucinex and stay well-hydrated -Follow-up immediately for any fever or increased shortness of breath -Patient states he has required antibiotics frequently in the past to clear from similar infections.  We did print out prescription for doxycycline 100 mg twice daily for 7 days if he has any worsening symptoms  No follow-ups on file.    Evelena Peat, MD

## 2022-08-24 ENCOUNTER — Telehealth: Payer: Self-pay

## 2022-08-24 NOTE — Telephone Encounter (Signed)
   Pre-operative Risk Assessment    Patient Name: Charles Lloyd.  DOB: [birth year] MRN: [id number]     Request for Surgical Clearance    Procedure:   LESI L5-S1  Date of Surgery:  Clearance TBD                                 Surgeon:  Aileen Fass Surgeon's Group or Practice Name:  [facility]  Phone number:  [phone] Fax number:  [phone]   Type of Clearance Requested:   - Pharmacy:  Hold Apixaban (Eliquis)     Type of Anesthesia:   None specified    Additional requests/questions:    Golden Pop   08/24/2022, 9:37 AM

## 2022-08-24 NOTE — Telephone Encounter (Signed)
Pharmacy please advise on holding Eliquis prior to L5-S1 ESI scheduled for TBD. Thank you.

## 2022-08-24 NOTE — Telephone Encounter (Signed)
   Patient Name: Charles Lloyd.  DOB: Jan 26, 1941 MRN: [id number]  Primary Cardiologist: Norman Herrlich, MD  Clinical pharmacists have reviewed the patient's past medical history, labs, and current medications as part of preoperative protocol coverage. The following recommendations have been made:  Per office protocol, patient can hold Eliquis  for 3 days prior to procedure.   Patient will not need bridging with Lovenox (enoxaparin) around procedure.  I will route this recommendation to the requesting party via Epic fax function and remove from pre-op pool.  Please call with questions.  Napoleon Form, Leodis Rains, NP 08/24/2022, 11:47 AM

## 2022-08-24 NOTE — Telephone Encounter (Signed)
Patient with diagnosis of PAF on Eliquis for anticoagulation.    Procedure: LESI L5-S1  Date of procedure: TBD   CHA2DS2-VASc Score = 6   This indicates a 9.7% annual risk of stroke. The patient's score is based upon: CHF History: 1 HTN History: 1 Diabetes History: 1 Stroke History: 0 Vascular Disease History: 1 Age Score: 2 Gender Score: 0   CrCl 58 mL/min (Adj BW SrCr. 1.11 07/17/2022) Platelet count 194 K (07/17/2022)    Per office protocol, patient can hold Eliquis  for 3 days prior to procedure.   Patient will not need bridging with Lovenox (enoxaparin) around procedure.  **This guidance is not considered finalized until pre-operative APP has relayed final recommendations.**

## 2022-08-27 ENCOUNTER — Ambulatory Visit: Payer: Medicare Other | Attending: Cardiology | Admitting: Cardiology

## 2022-08-27 VITALS — BP 130/60 | HR 92 | Ht 70.0 in | Wt 173.0 lb

## 2022-08-27 DIAGNOSIS — I480 Paroxysmal atrial fibrillation: Secondary | ICD-10-CM | POA: Diagnosis not present

## 2022-08-27 DIAGNOSIS — I2510 Atherosclerotic heart disease of native coronary artery without angina pectoris: Secondary | ICD-10-CM

## 2022-08-27 DIAGNOSIS — I10 Essential (primary) hypertension: Secondary | ICD-10-CM | POA: Diagnosis not present

## 2022-08-27 NOTE — Patient Instructions (Signed)

## 2022-08-27 NOTE — Progress Notes (Signed)
Cardiology Office Note:    Date:  08/27/2022   ID:  Charles Lloyd., DOB 1941/01/21, MRN [id number]  PCP:  Philip Aspen, Limmie Patricia, MD  Cardiologist:  Gypsy Balsam, MD    Referring MD: Philip Aspen, Estel*   Chief Complaint  Patient presents with   BP fluctuation   Medication Management  Doing fine  History of Present Illness:    Charles Lloyd. is a 82 y.o. male past medical history significant for paroxysmal atrial fibrillation, status post ablation done in 2015, 2021, 2022 now we using still rhythm control strategy which is achieved with flecainide.  He is anticoagulated Eliquis.  He was referred to [place] initially because of swelling of lower extremities medication has been modified amlodipine has been taking right now things are looking much better.  He comes complaining of having some sleeping problem within the last week as well as blood pressure being fluctuating a lot blood pressure is good today in the office.  No chest pain tightness squeezing pressure burning chest.  He did receive injection of his back for his pain and its much better right now  Past Medical History:  Diagnosis Date   Acute tracheobronchitis 01/05/2019   B12 deficiency    Basal cell carcinoma (BCC) of left side of nose 01/28/2018   Cancer (HCC)    Cardiomyopathy (HCC) 03/10/2018   With chronic atrial fibrillation   CHF (congestive heart failure) (HCC) 04/16/2013   Functional class II, ejection fraction 35-40%  Formatting of this note might be different from the original. Functional class II, ejection fraction 35-40%   Chronic anticoagulation    Chronic diastolic (congestive) heart failure (HCC) 04/16/2013   Functional class II, ejection fraction 35-40%  Last Assessment & Plan:  Clinically stable Volume well controlled meds reviewed   Chronic diastolic CHF (congestive heart failure) (HCC) 04/16/2013   Functional class II, ejection fraction 35-40%  Last Assessment & Plan:  Clinically stable  Volume well controlled meds reviewed   Colon polyps 04/16/2013   Coronary artery disease involving native coronary artery of native heart without angina pectoris 04/05/2022   Myoview 05/25/2019: EF 60, normal perfusion, low risk CCTA (AF protocol) 04/25/20: CAC score 4242 (99th percentile)   Diabetic neuropathy (HCC)    Eosinophil count raised 02/17/2019   Essential hypertension 01/06/2010   Last Assessment & Plan:  Well controlled Continue med management   Gout    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  Steroid usage was discontinued several months ago.  Follow up with Dermatology as planned.   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   Last Assessment & Plan:  Id following ?ongoing abx managementy reported Request records   Infection of prosthetic right knee joint (HCC)    Insomnia 03/04/2012   Grief with loss of wife 02/20/12  Last Assessment & Plan:  Improved sx  contineu xanax prn Se discussed   Lesion of liver 04/20/2019   -On cardiac CT 02/2019   Mild reactive airways disease    Neoplasm of prostate 04/16/2013   Neoplasm of prostate, malignant (HCC) 01/06/2010   Darin Engels S/p radiation therapy   Last Assessment &  Plan:  Repeat psa today No urinary sx Pt reported fatigue/poor appetite   On amiodarone therapy 09/07/2013   On continuous oral anticoagulation 01/28/2018   Other activity(E029.9) 04/20/2013   Formatting of this note might be different from the original. Transthoracic Echocardiogram-03/10/2013-[facility]: Normal left ventricular wall thickness and cavity size.  Global left  ventricular systolic function is moderately reduced.  The estimated ejection fraction is 35-40%.  The left atrium is moderately enlarged.  The right atrium is mildly enlarged.  No significant valvular    Overweight (BMI 25.0-29.9) 10/22/2016   Persistent atrial fibrillation (HCC) 04/16/2013   Last Assessment & Plan:  Rate controlled Continue med management eliquis for stroke preventino  Formatting of this note might be different from the original.  Drug  HX Current Rx Pre-ABL inefficacy Pre-ABL intolerant Post-ABL inefficacy Post-ABL intolerant max dose/24h M/Y end comments  sotalol                  dofetilide                  flecainide                  propafenone                  am   S/P TKR (total knee replacement), bilateral    Secondary hypercoagulable state (HCC) 04/09/2019   Tinea cruris 04/16/2013   Type 2 diabetes mellitus with diabetic polyneuropathy, without long-term current use of insulin (HCC) 04/16/2013   Last Assessment & Plan:  Labs today Pt reports well controlled on ambulatory monitoring   Vitamin D deficiency 07/25/2018    Past Surgical History:  Procedure Laterality Date   ATRIAL FIBRILLATION ABLATION N/A 03/12/2019   Procedure: ATRIAL FIBRILLATION ABLATION;  Surgeon: Regan Lemming, MD;  Location: [facility];  Service: Cardiovascular;  Laterality: N/A;   ATRIAL FIBRILLATION ABLATION N/A 04/29/2020   Procedure: ATRIAL FIBRILLATION ABLATION;  Surgeon: Regan Lemming, MD;  Location: [facility];  Service: Cardiovascular;  Laterality: N/A;   CARDIAC ELECTROPHYSIOLOGY STUDY AND ABLATION     CARDIOVERSION     CARDIOVERSION N/A 10/10/2018   Procedure: CARDIOVERSION;  Surgeon: Wendall Stade, MD;  Location: [facility] ENDOSCOPY;  Service: Cardiovascular;  Laterality: N/A;   CARDIOVERSION N/A 05/18/2020   Procedure: CARDIOVERSION;  Surgeon: Vesta Mixer, MD;  Location: [facility] ENDOSCOPY;  Service: Cardiovascular;  Laterality: N/A;   CARDIOVERSION N/A 12/09/2020    Procedure: CARDIOVERSION;  Surgeon: Lewayne Bunting, MD;  Location: [facility] ENDOSCOPY;  Service: Cardiovascular;  Laterality: N/A;   CHOLECYSTECTOMY     PROSTATECTOMY     REPLACEMENT TOTAL KNEE BILATERAL      Current Medications: No outpatient medications have been marked as taking for the 08/27/22 encounter (Office Visit) with Georgeanna Lea, MD.     Allergies:   Patient has no known allergies.   Social History   Socioeconomic History   Marital status: Widowed    Spouse name: Not on file   Number of children: Not on file   Years of education: Not on file   Highest education level: Not on file  Occupational History   Not on file  Tobacco Use   Smoking status: Never    Passive exposure: Never   Smokeless tobacco: Never  Vaping Use   Vaping Use: Never used  Substance and Sexual Activity   Alcohol use: Yes    Alcohol/week: 14.0 standard drinks of alcohol  Types: 14 Glasses of wine per week   Drug use: Never   Sexual activity: Not Currently  Other Topics Concern   Not on file  Social History Narrative   Not on file   Social Determinants of Health   Financial Resource Strain: Not on file  Food Insecurity: No Food Insecurity (11/16/2021)   Hunger Vital Sign    Worried About Running Out of Food in the Last Year: Never true    Ran Out of Food in the Last Year: Never true  Transportation Needs: No Transportation Needs (11/16/2021)   PRAPARE - Administrator, Civil Service (Medical): No    Lack of Transportation (Non-Medical): No  Physical Activity: Not on file  Stress: Not on file  Social Connections: Not on file     Family History: The patient's family history includes Cancer in his father and mother; Depression in his father and mother; Early death in his father and mother. ROS:   Please see the history of present illness.    All 14 point review of systems negative except as described per history of present illness  EKGs/Labs/Other Studies Reviewed:          Recent Labs: 11/16/2021: Magnesium 2.2 01/26/2022: B Natriuretic Peptide 380.8 04/26/2022: NT-Pro BNP 561 07/12/2022: TSH 2.11 07/17/2022: ALT 43; BUN 20; Creatinine, Ser 1.11; Hemoglobin 11.5; Platelets 194.0; Potassium 3.3; Sodium 132  Recent Lipid Panel    Component Value Date/Time   CHOL 120 05/20/2020 0914   TRIG 73 05/20/2020 0914   HDL 46 05/20/2020 0914   CHOLHDL 2.6 05/20/2020 0914   CHOLHDL 3 07/24/2018 1058   VLDL 25.0 07/24/2018 1058   LDLCALC 59 05/20/2020 0914    Physical Exam:    VS:  BP 130/60 (BP Location: Left Arm, Patient Position: Sitting)   Pulse 92   Ht 5\' 10"  (1.778 m)   Wt 173 lb (78.5 kg)   SpO2 95%   BMI 24.82 kg/m     Wt Readings from Last 3 Encounters:  08/27/22 173 lb (78.5 kg)  08/22/22 175 lb 3.2 oz (79.5 kg)  07/12/22 186 lb 3.2 oz (84.5 kg)     GEN:  Well nourished, well developed in no acute distress HEENT: Normal NECK: No JVD; No carotid bruits LYMPHATICS: No lymphadenopathy CARDIAC: RRR, no murmurs, no rubs, no gallops RESPIRATORY:  Clear to auscultation without rales, wheezing or rhonchi  ABDOMEN: Soft, non-tender, non-distended MUSCULOSKELETAL:  No edema; No deformity  SKIN: Warm and dry LOWER EXTREMITIES: no swelling NEUROLOGIC:  Alert and oriented x 3 PSYCHIATRIC:  Normal affect   ASSESSMENT:    1. Coronary artery disease involving native coronary artery of native heart without angina pectoris   2. Paroxysmal A-fib (HCC)   3. Essential hypertension    PLAN:    In order of problems listed above:  Paroxysmal atrial fibrillation: Stable from that point review doing well continue anticoagulation. Coronary artery disease stable denies have any symptoms that would indicate reactivation of the problem risk factors modifications. Essential hypertension which seems to be well-controlled today in the office but he said when he checked it at home he gets much higher number I told him to check his blood pressure monitors  meaning that when he was he will be in our office He will bring it so he can verify accuracy of the device, also talk to him about potentially having 24 hours monitor.  We do not have it in this office he will have to drive  to [place] office he does not want to do it.  Therefore asking for next week or so to check blood pressure on the regular basis twice a day and then recorded. Frequent ventricular ectopy with total burden of 10% however echocardiogram still showed preserved ejection fraction, he is asymptomatic we will continue present management   Medication Adjustments/Labs and Tests Ordered: Current medicines are reviewed at length with the patient today.  Concerns regarding medicines are outlined above.  Orders Placed This Encounter  Procedures   EKG 12-Lead   Medication changes: No orders of the defined types were placed in this encounter.   Signed, Georgeanna Lea, MD, [facility] 08/27/2022 1:53 PM     [facility]

## 2022-09-23 ENCOUNTER — Emergency Department (HOSPITAL_BASED_OUTPATIENT_CLINIC_OR_DEPARTMENT_OTHER): Payer: Medicare Other

## 2022-09-23 ENCOUNTER — Emergency Department (HOSPITAL_BASED_OUTPATIENT_CLINIC_OR_DEPARTMENT_OTHER)
Admission: EM | Admit: 2022-09-23 | Discharge: 2022-09-23 | Disposition: A | Discharge status: Home / Self Care | Payer: Medicare Other | Attending: Emergency Medicine | Admitting: Emergency Medicine

## 2022-09-23 ENCOUNTER — Encounter (HOSPITAL_BASED_OUTPATIENT_CLINIC_OR_DEPARTMENT_OTHER): Payer: Self-pay

## 2022-09-23 DIAGNOSIS — S51011A Laceration without foreign body of right elbow, initial encounter: Secondary | ICD-10-CM

## 2022-09-23 DIAGNOSIS — Z7901 Long term (current) use of anticoagulants: Secondary | ICD-10-CM | POA: Insufficient documentation

## 2022-09-23 DIAGNOSIS — W01198A Fall on same level from slipping, tripping and stumbling with subsequent striking against other object, initial encounter: Secondary | ICD-10-CM | POA: Insufficient documentation

## 2022-09-23 DIAGNOSIS — W19XXXA Unspecified fall, initial encounter: Secondary | ICD-10-CM

## 2022-09-23 DIAGNOSIS — S0101XA Laceration without foreign body of scalp, initial encounter: Secondary | ICD-10-CM | POA: Diagnosis not present

## 2022-09-23 DIAGNOSIS — S0990XA Unspecified injury of head, initial encounter: Secondary | ICD-10-CM | POA: Diagnosis present

## 2022-09-23 NOTE — ED Triage Notes (Signed)
He states that, as he was "doing laundry" he driopped something, and upon attempting to bend over to retrieve it he lost his balance and fell. He has a 3cm lac. At mid frontal area of scalp and also a small skin tear of right elbow area. He steates he "landed on my butt" and c/o some minimal soreness at buttocks area. He is able to bear weight and he denies any l.o.c. he is alert and oriented x 4 with clear speech.

## 2022-09-23 NOTE — Discharge Instructions (Addendum)
1.  At this time there is no evidence of traumatic injury to the brain.  Your CT scan does not show any bleeding problems.  Since you take Eliquis, watch closely for any changes.  Review head injury instructions and return if any concerning symptoms develop. 2.  Tissue glue was applied to the laceration on your head.  It is a minor laceration but people tend to bleed a lot when they take Eliquis.  Do not disrupt this glue.  Leave it in place and in 24 hours you may bathe normally and allow water to run over it but do not scrub it or pick at it.  Do not put any ointments or other substances on it.  Will start to come off on its own. 3.  You have a skin tear in your elbow.  A dressing was applied.  The dressing may stay in place for the next 3 days.  You may change the outer dressing as needed.  Continue to apply antibiotic ointment and nonstick dressing until it is healed.

## 2022-09-23 NOTE — ED Provider Notes (Signed)
Charles Lloyd Provider Note   CSN: [id number] Arrival date & time: 09/23/22  0820     History  Chief Complaint  Patient presents with   Head Laceration    Charles Lloyd. is a 82 y.o. male.  HPI Patient was doing his laundry this morning.  He reports he was crouched down and putting close in the dryer.  He reports usually he holds onto the dryer to maintain his balance but he was not holding on at the time.  He tipped over forward and hit the top of his head on the knob of the dryer or a cabinet.  He then rocked backwards and fell to the floor landing  on his left elbow.  Patient denies he has significant pain.  He does take Eliquis.  There was a area in his scalp that continues to bleed.  This occurred about an hour ago and it continues to ooze some blood.    Home Medications Prior to Admission medications   Medication Sig Start Date End Date Taking? Authorizing Provider  ACCU-CHEK AVIVA PLUS test strip 1 EACH BY OTHER ROUTE DAILY. DX E11.9 Patient taking differently: 2 each by Other route daily. Dx E11.9 08/28/21   Philip Aspen, Limmie Patricia, MD  albuterol (VENTOLIN HFA) 108 (90 Base) MCG/ACT inhaler Inhale 1-2 puffs into the lungs every 6 (six) hours as needed for wheezing or shortness of breath. 10/05/20   Parrett, Virgel Bouquet, NP  allopurinol (ZYLOPRIM) 300 MG tablet TAKE 1 TABLET BY MOUTH EVERY DAY 07/24/22   Philip Aspen, Limmie Patricia, MD  atorvastatin (LIPITOR) 20 MG tablet TAKE 1 TABLET DAILY (PLEASE SCHEDULE A PHYSICAL FOR MORE REFILLS) 07/02/22   Philip Aspen, Limmie Patricia, MD  carvedilol (COREG) 3.125 MG tablet TAKE 1 TABLET BY MOUTH TWICE A DAY 08/20/22   Philip Aspen, Limmie Patricia, MD  colchicine 0.6 MG tablet Take 1 tablet (0.6 mg total) by mouth 2 (two) times daily. 01/08/22   Philip Aspen, Limmie Patricia, MD  Continuous Blood Gluc Receiver (FREESTYLE LIBRE 2 READER) DEVI 1 each by Does not apply route daily. 08/28/21   Philip Aspen,  Limmie Patricia, MD  Continuous Blood Gluc Sensor (FREESTYLE LIBRE 2 SENSOR) MISC 1 each by Does not apply route daily. 08/28/21   Philip Aspen, Limmie Patricia, MD  doxycycline (VIBRAMYCIN) 100 MG capsule Take 1 capsule (100 mg total) by mouth 2 (two) times daily. 08/22/22   Burchette, Elberta Fortis, MD  ELIQUIS 5 MG TABS tablet TAKE 1 TABLET TWICE A DAY Patient taking differently: Take 5 mg by mouth 2 (two) times daily. 10/02/21   Philip Aspen, Limmie Patricia, MD  flecainide (TAMBOCOR) 100 MG tablet TAKE 1 TABLET BY MOUTH TWICE A DAY 11/03/21   Camnitz, Will Daphine Deutscher, MD  fluticasone [facility]) 50 MCG/ACT nasal spray Place 2 sprays into both nostrils daily. 01/07/19   Berton Mount I, MD  fluticasone furoate-vilanterol (BREO ELLIPTA) 100-25 MCG/INH AEPB Inhale 1 puff into the lungs daily. 10/05/20   Parrett, Virgel Bouquet, NP  gabapentin (NEURONTIN) 300 MG capsule Take 2 capsules (600 mg total) by mouth at bedtime. 08/03/22   Philip Aspen, Limmie Patricia, MD  hydrALAZINE (APRESOLINE) 25 MG tablet Take 1 tablet (25 mg total) by mouth 4 (four) times daily. 06/26/22 09/24/22  Georgeanna Lea, MD  insulin glargine (LANTUS SOLOSTAR) 100 UNIT/ML Solostar Pen Inject 8 Units into the skin daily. 09/29/21   Burchette, Elberta Fortis, MD  KLOR-CON M20 20 MEQ tablet TAKE  1 TABLET BY MOUTH EVERY DAY 11/20/21   Philip Aspen, Limmie Patricia, MD  montelukast (SINGULAIR) 10 MG tablet Take 10 mg by mouth at bedtime.     [provider]  RESTASIS 0.05 % ophthalmic emulsion Place 1 drop into both eyes 2 (two) times daily as needed (dry eyes). 12/09/19   [provider]  torsemide (DEMADEX) 20 MG tablet Take 1 tablet (20 mg total) by mouth daily. Start taking from Monday only 11/18/21   Burnadette Pop, MD      Allergies    Patient has no known allergies.    Review of Systems   Review of Systems  Physical Exam Updated Vital Signs BP (!) 165/89 (BP Location: Left Arm)   Pulse 88   Temp 98.5 F (36.9 C) (Oral)   Resp 18   SpO2 98%   Physical Exam Constitutional:      Comments: Alert nontoxic clinically well in appearance.  HENT:     Head:     Comments: Patient has superficial laceration to the top of the scalp at about the midline.  About 1 cm.  This does not penetrate all dermal layers.  Just slight separation into the dermis.  There is a pinpoint area that tends to ooze small amount of blood.  No hematoma.    Nose: Nose normal.     Mouth/Throat:     Mouth: Mucous membranes are moist.     Pharynx: Oropharynx is clear.  Eyes:     Extraocular Movements: Extraocular movements intact.     Pupils: Pupils are equal, round, and reactive to light.  Cardiovascular:     Comments: Regular with PVC about every third beat. Pulmonary:     Effort: Pulmonary effort is normal.     Breath sounds: Normal breath sounds.  Musculoskeletal:        General: Normal range of motion.     Comments: Patient has small superficial skin tear to the lateral aspect of the right elbow.  This is about 2 cm with no active bleeding.  No effusion at the elbow.  Normal range of motion without deformity.  Skin:    General: Skin is warm and dry.  Neurological:     General: No focal deficit present.     Mental Status: He is oriented to person, place, and time.     Motor: No weakness.     Coordination: Coordination normal.  Psychiatric:        Mood and Affect: Mood normal.     ED Results / Procedures / Treatments   Labs (all labs ordered are listed, but only abnormal results are displayed) Labs Reviewed - No data to display  EKG None  Radiology CT Head Wo Contrast  Result Date: 09/23/2022 CLINICAL DATA:  82 year old male status post fall while bending over. Scalp laceration. EXAM: CT HEAD WITHOUT CONTRAST TECHNIQUE: Contiguous axial images were obtained from the base of the skull through the vertex without intravenous contrast. RADIATION DOSE REDUCTION: This exam was performed according to the departmental dose-optimization program which  includes automated exposure control, adjustment of the mA and/or kV according to patient size and/or use of iterative reconstruction technique. COMPARISON:  Head CT 11/16/2021. FINDINGS: Brain: Stable cerebral volume since last year. No midline shift, ventriculomegaly, mass effect, evidence of mass lesion, intracranial hemorrhage or evidence of cortically based acute infarction. Gray-white differentiation is stable and within normal limits for age. Vascular: No suspicious intracranial vascular hyperdensity. Skull: No acute osseous abnormality identified. Sinuses/Orbits: Mild generalized  paranasal sinus mucosal thickening is new. No sinus fluid levels. Tympanic cavities and mastoids remain clear. Other: Chronic left posterior vertex scalp soft tissue scarring is stable. No acute orbit or scalp soft tissue injury identified. IMPRESSION: 1. No acute traumatic injury identified. 2. Stable and negative for age non contrast CT appearance of the Brain. 3. New mild paranasal sinus inflammation. Electronically Signed   By: Odessa Fleming M.D.   On: 09/23/2022 09:34    Procedures .Marland KitchenLaceration Repair  Date/Time: 09/23/2022 10:48 AM  Performed by: Arby Barrette, MD Authorized by: Arby Barrette, MD   Consent:    Consent obtained:  Verbal   Consent given by:  Patient Anesthesia:    Anesthesia method:  None Laceration details:    Location:  Scalp   Scalp location:  Crown   Length (cm):  2   Depth (mm):  3 Treatment:    Area cleansed with:  Chlorhexidine   Amount of cleaning:  Standard Skin repair:    Repair method:  Tissue adhesive Approximation:    Approximation:  Close Repair type:    Repair type:  Simple Post-procedure details:    Dressing:  Open (no dressing)     Medications Ordered in ED Medications - No data to display  ED Course/ Medical Decision Making/ A&P                             Medical Decision Making Amount and/or Complexity of Data Reviewed Radiology: ordered.   Patient is  anticoagulated on Eliquis.  He had minor head injury.  Will proceed with CT head.  There is a superficial laceration to the top of the scalp with a very minimal slow ooze of blood.  This seems to be resolving with applied pressure.  No hematoma.  Patient also has a minor skin tear to the elbow.  No active bleeding.  Will dress with Xeroform dressing.  No other associated significant injury.  Patient was in a crouched position and lost his balance.  I do not suspect other etiology for his fall.  At this time we will proceed with CT head and treat minor skin injuries.  CT head interpreted by radiology no acute findings.  Wound cleaned with chlorhexidine and then dried.  Just minimal slow ooze.  Pressure applied.  All bleeding stopped.  Closed with Dermabond.  It is very superficial.  Patient is clinically well in appearance.  Head mechanical fall from ground height essentially with minor head trauma anticoagulated on Eliquis.  At this time no signs of intracranial bleeding.  Minor scalp laceration and elbow skin tear treated.  Discharge instructions include return precautions and care of wounds.        Final Clinical Impression(s) / ED Diagnoses Final diagnoses:  Fall, initial encounter  Laceration of scalp, initial encounter  Anticoagulated  Skin tear of right elbow without complication, initial encounter    Rx / DC Orders ED Discharge Orders     None         Arby Barrette, MD 09/23/22 1054

## 2022-10-01 ENCOUNTER — Ambulatory Visit: Payer: Medicare Other | Admitting: Internal Medicine

## 2022-10-01 ENCOUNTER — Encounter: Payer: Self-pay | Admitting: Internal Medicine

## 2022-10-01 ENCOUNTER — Other Ambulatory Visit: Payer: Self-pay | Admitting: Internal Medicine

## 2022-10-01 VITALS — BP 144/68 | HR 70 | Temp 97.8°F | Wt 178.7 lb

## 2022-10-01 DIAGNOSIS — E1142 Type 2 diabetes mellitus with diabetic polyneuropathy: Secondary | ICD-10-CM

## 2022-10-01 DIAGNOSIS — G6289 Other specified polyneuropathies: Secondary | ICD-10-CM | POA: Diagnosis not present

## 2022-10-01 DIAGNOSIS — R21 Rash and other nonspecific skin eruption: Secondary | ICD-10-CM | POA: Diagnosis not present

## 2022-10-01 MED ORDER — GABAPENTIN 300 MG PO CAPS
ORAL_CAPSULE | ORAL | 2 refills | Status: DC
Start: 2022-10-01 — End: 2023-04-22

## 2022-10-01 NOTE — Progress Notes (Signed)
Established Patient Office Visit     CC/Reason for Visit: Rash, gabapentin refill  HPI: Charles Lloyd. is a 82 y.o. male who is coming in today for the above mentioned reasons.  For the last week he has noticed a red punctuated rash over his neck and upper torso.  No new laundry detergents, soaps or body lotions, no excessive sun exposure, no yard work.  It is not itchy.  He is also needing a refill of his gabapentin as he has increased dose to 4 tablets daily.   Past Medical/Surgical History: Past Medical History:  Diagnosis Date   Acute tracheobronchitis 01/05/2019   B12 deficiency    Basal cell carcinoma (BCC) of left side of nose 01/28/2018   Cancer (HCC)    Cardiomyopathy (HCC) 03/10/2018   With chronic atrial fibrillation   CHF (congestive heart failure) (HCC) 04/16/2013   Functional class II, ejection fraction 35-40%  Formatting of this note might be different from the original. Functional class II, ejection fraction 35-40%   Chronic anticoagulation    Chronic diastolic (congestive) heart failure (HCC) 04/16/2013   Functional class II, ejection fraction 35-40%  Last Assessment & Plan:  Clinically stable Volume well controlled meds reviewed   Chronic diastolic CHF (congestive heart failure) (HCC) 04/16/2013   Functional class II, ejection fraction 35-40%  Last Assessment & Plan:  Clinically stable Volume well controlled meds reviewed   Colon polyps 04/16/2013   Coronary artery disease involving native coronary artery of native heart without angina pectoris 04/05/2022   Myoview 05/25/2019: EF 60, normal perfusion, low risk CCTA (AF protocol) 04/25/20: CAC score 4242 (99th percentile)   Diabetic neuropathy (HCC)    Eosinophil count raised 02/17/2019   Essential hypertension 01/06/2010   Last Assessment & Plan:  Well controlled Continue med management   Gout    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  Steroid  usage was discontinued several months ago.  Follow up with Dermatology as planned.   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   Last Assessment & Plan:  Id following ?ongoing abx managementy reported Request records   Infection of prosthetic right knee joint (HCC)    Insomnia 03/04/2012   Grief with loss of wife 02/20/12  Last Assessment & Plan:  Improved sx  contineu xanax prn Se discussed   Lesion of liver 04/20/2019   -On cardiac CT 02/2019   Mild reactive airways disease    Neoplasm of prostate 04/16/2013   Neoplasm of prostate, 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 Transthoracic Echocardiogram-03/10/2013-[facility]: Normal left ventricular wall thickness and cavity size.  Global left ventricular systolic function is moderately reduced.  The estimated ejection fraction is 35-40%.  The left atrium is moderately enlarged.  The right atrium is mildly enlarged.  No significant valvular    Overweight (BMI 25.0-29.9)  10/22/2016   Persistent atrial fibrillation (HCC) 04/16/2013   Last Assessment & Plan:  Rate controlled Continue med management eliquis for stroke preventino  Formatting of this note might be different from the original.  Drug  HX Current Rx Pre-ABL inefficacy Pre-ABL intolerant Post-ABL inefficacy Post-ABL intolerant max dose/24h M/Y end comments  sotalol                   dofetilide                  flecainide                  propafenone                  am   S/P TKR (total knee replacement), bilateral    Secondary hypercoagulable state (HCC) 04/09/2019   Tinea cruris 04/16/2013   Type 2 diabetes mellitus with diabetic polyneuropathy, without long-term current use of insulin (HCC) 04/16/2013   Last Assessment & Plan:  Labs today Pt reports well controlled on ambulatory monitoring   Vitamin D deficiency 07/25/2018    Past Surgical History:  Procedure Laterality Date   ATRIAL FIBRILLATION ABLATION N/A 03/12/2019   Procedure: ATRIAL FIBRILLATION ABLATION;  Surgeon: Regan Lemming, MD;  Location: [facility] INVASIVE CV LAB;  Service: Cardiovascular;  Laterality: N/A;   ATRIAL FIBRILLATION ABLATION N/A 04/29/2020   Procedure: ATRIAL FIBRILLATION ABLATION;  Surgeon: Regan Lemming, MD;  Location: [facility] INVASIVE CV LAB;  Service: Cardiovascular;  Laterality: N/A;   CARDIAC ELECTROPHYSIOLOGY STUDY AND ABLATION     CARDIOVERSION     CARDIOVERSION N/A 10/10/2018   Procedure: CARDIOVERSION;  Surgeon: Wendall Stade, MD;  Location: [facility] ENDOSCOPY;  Service: Cardiovascular;  Laterality: N/A;   CARDIOVERSION N/A 05/18/2020   Procedure: CARDIOVERSION;  Surgeon: Vesta Mixer, MD;  Location: [facility] ENDOSCOPY;  Service: Cardiovascular;  Laterality: N/A;   CARDIOVERSION N/A 12/09/2020   Procedure: CARDIOVERSION;  Surgeon: Lewayne Bunting, MD;  Location: [facility] ENDOSCOPY;  Service: Cardiovascular;  Laterality: N/A;   CHOLECYSTECTOMY     PROSTATECTOMY     REPLACEMENT TOTAL KNEE BILATERAL      Social History:  reports that he has never smoked. He has never been exposed to tobacco smoke. He has never used smokeless tobacco. He reports current alcohol use of about 14.0 standard drinks of alcohol per week. He reports that he does not use drugs.  Allergies: No Known Allergies  Family History:  Family History  Problem Relation Age of Onset   Cancer Mother     Depression Mother    Early death Mother    Cancer Father    Depression Father    Early death Father      Current Outpatient Medications:    ACCU-CHEK AVIVA PLUS test strip, 1 EACH BY OTHER ROUTE DAILY. DX E11.9 (Patient taking differently: 2 each by Other route daily. Dx E11.9), Disp: 100 strip, Rfl: 12   albuterol (VENTOLIN HFA) 108 (90 Base) MCG/ACT inhaler, Inhale 1-2 puffs into the lungs every 6 (six) hours as needed for wheezing or shortness of breath., Disp: 8 g, Rfl: 2   allopurinol (ZYLOPRIM) 300 MG tablet, TAKE 1 TABLET BY MOUTH EVERY DAY, Disp: 90 tablet, Rfl: 1   atorvastatin (LIPITOR) 20 MG tablet, TAKE 1 TABLET DAILY (PLEASE SCHEDULE A PHYSICAL FOR MORE REFILLS), Disp: 90 tablet, Rfl: 3   carvedilol (COREG) 3.125 MG tablet, TAKE 1 TABLET BY MOUTH TWICE A DAY, Disp: 180 tablet, Rfl: 0  colchicine 0.6 MG tablet, Take 1 tablet (0.6 mg total) by mouth 2 (two) times daily., Disp: 60 tablet, Rfl: 2   Continuous Blood Gluc Receiver (FREESTYLE LIBRE 2 READER) DEVI, 1 each by Does not apply route daily., Disp: 1 each, Rfl: 2   Continuous Blood Gluc Sensor (FREESTYLE LIBRE 2 SENSOR) MISC, 1 each by Does not apply route daily., Disp: 2 each, Rfl: 6   doxycycline (VIBRAMYCIN) 100 MG capsule, Take 1 capsule (100 mg total) by mouth 2 (two) times daily., Disp: 14 capsule, Rfl: 0   ELIQUIS 5 MG TABS tablet, TAKE 1 TABLET TWICE A DAY (Patient taking differently: Take 5 mg by mouth 2 (two) times daily.), Disp: 180 tablet, Rfl: 3   flecainide (TAMBOCOR) 100 MG tablet, TAKE 1 TABLET BY MOUTH TWICE A DAY, Disp: 180 tablet, Rfl: 3   fluticasone (FLONASE) 50 MCG/ACT nasal spray, Place 2 sprays into both nostrils daily., Disp: 16 g, Rfl: 2   fluticasone furoate-vilanterol (BREO ELLIPTA) 100-25 MCG/INH AEPB, Inhale 1 puff into the lungs daily., Disp: 60 each, Rfl: 11   insulin glargine (LANTUS SOLOSTAR) 100 UNIT/ML Solostar Pen, Inject 8 Units into the skin daily., Disp: 15 mL, Rfl: 0   KLOR-CON M20 20  MEQ tablet, TAKE 1 TABLET BY MOUTH EVERY DAY, Disp: 90 tablet, Rfl: 3   montelukast (SINGULAIR) 10 MG tablet, Take 10 mg by mouth at bedtime. , Disp: , Rfl:    RESTASIS 0.05 % ophthalmic emulsion, Place 1 drop into both eyes 2 (two) times daily as needed (dry eyes)., Disp: , Rfl:    torsemide (DEMADEX) 20 MG tablet, Take 1 tablet (20 mg total) by mouth daily. Start taking from Monday only, Disp: 180 tablet, Rfl: 3   gabapentin (NEURONTIN) 300 MG capsule, Take one tablet in the morning, one tablet in the afternoon, and 2 tablets at bedtime, Disp: 120 capsule, Rfl: 2   hydrALAZINE (APRESOLINE) 25 MG tablet, Take 1 tablet (25 mg total) by mouth 4 (four) times daily., Disp: 360 tablet, Rfl: 3  Review of Systems:  Negative unless indicated in HPI.   Physical Exam: Vitals:   10/01/22 1056 10/01/22 1100  BP: (!) 170/68 (!) 144/68  Pulse: 70   Temp: 97.8 F (36.6 C)   TempSrc: Oral   SpO2: 99%   Weight: 178 lb 11.2 oz (81.1 kg)     Body mass index is 25.64 kg/m.   Physical Exam Vitals reviewed.  Constitutional:      Appearance: Normal appearance.  HENT:     Head: Normocephalic and atraumatic.  Eyes:     Conjunctiva/sclera: Conjunctivae normal.     Pupils: Pupils are equal, round, and reactive to light.  Cardiovascular:     Rate and Rhythm: Normal rate and regular rhythm.  Pulmonary:     Effort: Pulmonary effort is normal.     Breath sounds: Normal breath sounds.  Skin:    General: Skin is warm and dry.  Neurological:     General: No focal deficit present.     Mental Status: He is alert and oriented to person, place, and time.  Psychiatric:        Mood and Affect: Mood normal.        Behavior: Behavior normal.        Thought Content: Thought content normal.        Judgment: Judgment normal.      Impression and Plan:  Rash  Other polyneuropathy -     Gabapentin; Take one tablet  in the morning, one tablet in the afternoon, and 2 tablets at bedtime  Dispense: 120  capsule; Refill: 2  Diabetic polyneuropathy associated with type 2 diabetes mellitus (HCC)   -Unclear etiology for rash, likely heat rash.  Monitor for now. -Gabapentin refill provided.  Time spent:22 minutes reviewing chart, interviewing and examining patient and formulating plan of care.     Chaya Jan, MD [facility]

## 2022-10-06 ENCOUNTER — Other Ambulatory Visit: Payer: Self-pay | Admitting: Cardiology

## 2022-10-08 ENCOUNTER — Telehealth: Payer: Self-pay | Admitting: Cardiology

## 2022-10-08 MED ORDER — FLECAINIDE ACETATE 100 MG PO TABS: 100.0000 mg | ORAL_TABLET | Freq: Two times a day (BID) | ORAL | 0 refills | Status: DC

## 2022-10-08 NOTE — Telephone Encounter (Signed)
Pt's medication was sent to pt's pharmacy as requested. Confirmation received.  °

## 2022-10-08 NOTE — Telephone Encounter (Signed)
*  STAT* If patient is at the pharmacy, call can be transferred to refill team.   1. Which medications need to be refilled? (please list name of each medication and dose if known) flecainide (TAMBOCOR) 100 MG tablet   2. Which pharmacy/location (including street and city if local pharmacy) is medication to be sent to?  [id number] [place], [place] - [address]    3. Do they need a 30 day or 90 day supply? 30

## 2022-10-19 ENCOUNTER — Encounter (HOSPITAL_BASED_OUTPATIENT_CLINIC_OR_DEPARTMENT_OTHER): Payer: Self-pay

## 2022-10-19 ENCOUNTER — Emergency Department (HOSPITAL_BASED_OUTPATIENT_CLINIC_OR_DEPARTMENT_OTHER)
Admission: EM | Admit: 2022-10-19 | Discharge: 2022-10-19 | Disposition: A | Discharge status: Home / Self Care | Payer: Medicare Other | Attending: Emergency Medicine | Admitting: Emergency Medicine

## 2022-10-19 ENCOUNTER — Other Ambulatory Visit: Payer: Self-pay

## 2022-10-19 ENCOUNTER — Other Ambulatory Visit (HOSPITAL_BASED_OUTPATIENT_CLINIC_OR_DEPARTMENT_OTHER): Payer: Self-pay

## 2022-10-19 ENCOUNTER — Emergency Department (HOSPITAL_BASED_OUTPATIENT_CLINIC_OR_DEPARTMENT_OTHER): Payer: Medicare Other

## 2022-10-19 DIAGNOSIS — Z79899 Other long term (current) drug therapy: Secondary | ICD-10-CM | POA: Diagnosis not present

## 2022-10-19 DIAGNOSIS — R001 Bradycardia, unspecified: Secondary | ICD-10-CM | POA: Diagnosis present

## 2022-10-19 DIAGNOSIS — I110 Hypertensive heart disease with heart failure: Secondary | ICD-10-CM | POA: Diagnosis not present

## 2022-10-19 DIAGNOSIS — Z794 Long term (current) use of insulin: Secondary | ICD-10-CM | POA: Diagnosis not present

## 2022-10-19 DIAGNOSIS — E119 Type 2 diabetes mellitus without complications: Secondary | ICD-10-CM | POA: Diagnosis not present

## 2022-10-19 DIAGNOSIS — I509 Heart failure, unspecified: Secondary | ICD-10-CM | POA: Diagnosis not present

## 2022-10-19 DIAGNOSIS — I493 Ventricular premature depolarization: Secondary | ICD-10-CM | POA: Diagnosis not present

## 2022-10-19 DIAGNOSIS — Z7901 Long term (current) use of anticoagulants: Secondary | ICD-10-CM | POA: Insufficient documentation

## 2022-10-19 LAB — CBC
HCT: 35.6 % — ABNORMAL LOW (ref 39.0–52.0)
Hemoglobin: 11.9 g/dL — ABNORMAL LOW (ref 13.0–17.0)
MCH: 30.2 pg (ref 26.0–34.0)
MCHC: 33.4 g/dL (ref 30.0–36.0)
MCV: 90.4 fL (ref 80.0–100.0)
Platelets: 209 10*3/uL (ref 150–400)
RBC: 3.94 MIL/uL — ABNORMAL LOW (ref 4.22–5.81)
RDW: 16.3 % — ABNORMAL HIGH (ref 11.5–15.5)
WBC: 5.3 10*3/uL (ref 4.0–10.5)
nRBC: 0 % (ref 0.0–0.2)

## 2022-10-19 LAB — BASIC METABOLIC PANEL
Anion gap: 11 (ref 5–15)
BUN: 25 mg/dL — ABNORMAL HIGH (ref 8–23)
CO2: 23 mmol/L (ref 22–32)
Calcium: 9.5 mg/dL (ref 8.9–10.3)
Chloride: 104 mmol/L (ref 98–111)
Creatinine, Ser: 1.15 mg/dL (ref 0.61–1.24)
GFR, Estimated: >60 mL/min (ref >60)
Glucose, Bld: 165 mg/dL — ABNORMAL HIGH (ref 70–99)
Potassium: 3.7 mmol/L (ref 3.5–5.1)
Sodium: 138 mmol/L (ref 135–145)

## 2022-10-19 LAB — TROPONIN I (HIGH SENSITIVITY): Troponin I (High Sensitivity): 10 ng/L (ref <18)

## 2022-10-19 NOTE — ED Provider Notes (Addendum)
[facility] AT [facility] Provider Note   CSN: [id number] Arrival date & time: 10/19/22  1408     History  Chief Complaint  Patient presents with   Bradycardia    Charles Lloyd. is a 82 y.o. male.  Patient is a 82 year old male with a history of CHF, atrial fibrillation status post ablations on flecainide and Eliquis, diabetes, hypertension who is presenting today due to concern that his heart rate was dropping.  Patient reports that he was feeling fine yesterday and about 1130 he went to the dentist to have a small cavity filled.  He states they use Novocain to numb his mouth but after being at the dentist he felt a little bit off and he said a little woozy.  He checked his blood pressure which was normal but when he looked at the monitor it reported that his heart rate was low in the 30s and 40s.  He checked later and it said the same thing but he felt back to baseline.  He states today he is checked several times and he has had times where his heart rates in the 70s and 80s but then times where his pulse ox monitor is reading in the 40s.  He has had no syncope or further dizziness.  He reports he just does not feel 100% but cannot put his finger on it.  He denies chest pain, shortness of breath.  He has been eating and drinking normally.  None of his medications have changed.  The history is provided by the patient.       Home Medications Prior to Admission medications   Medication Sig Start Date End Date Taking? Authorizing Provider  ACCU-CHEK AVIVA PLUS test strip 1 EACH BY OTHER ROUTE DAILY. DX E11.9 Patient taking differently: 2 each by Other route daily. Dx E11.9 08/28/21   Philip Aspen, Limmie Patricia, MD  albuterol (VENTOLIN HFA) 108 (90 Base) MCG/ACT inhaler Inhale 1-2 puffs into the lungs every 6 (six) hours as needed for wheezing or shortness of breath. 10/05/20   Parrett, Virgel Bouquet, NP  allopurinol (ZYLOPRIM) 300 MG tablet TAKE 1 TABLET BY MOUTH  EVERY DAY 07/24/22   Philip Aspen, Limmie Patricia, MD  atorvastatin (LIPITOR) 20 MG tablet TAKE 1 TABLET DAILY (PLEASE SCHEDULE A PHYSICAL FOR MORE REFILLS) 10/01/22   Philip Aspen, Limmie Patricia, MD  carvedilol (COREG) 3.125 MG tablet TAKE 1 TABLET BY MOUTH TWICE A DAY 08/20/22   Philip Aspen, Limmie Patricia, MD  colchicine 0.6 MG tablet Take 1 tablet (0.6 mg total) by mouth 2 (two) times daily. 01/08/22   Philip Aspen, Limmie Patricia, MD  Continuous Blood Gluc Receiver (FREESTYLE LIBRE 2 READER) DEVI 1 each by Does not apply route daily. 08/28/21   Philip Aspen, Limmie Patricia, MD  Continuous Blood Gluc Sensor (FREESTYLE LIBRE 2 SENSOR) MISC 1 each by Does not apply route daily. 08/28/21   Philip Aspen, Limmie Patricia, MD  doxycycline (VIBRAMYCIN) 100 MG capsule Take 1 capsule (100 mg total) by mouth 2 (two) times daily. 08/22/22   Burchette, Elberta Fortis, MD  ELIQUIS 5 MG TABS tablet TAKE 1 TABLET TWICE A DAY Patient taking differently: Take 5 mg by mouth 2 (two) times daily. 10/02/21   Philip Aspen, Limmie Patricia, MD  flecainide (TAMBOCOR) 100 MG tablet Take 1 tablet (100 mg total) by mouth 2 (two) times daily. 10/08/22   Camnitz, Will Daphine Deutscher, MD  fluticasone (FLONASE) 50 MCG/ACT nasal spray Place 2 sprays into both  nostrils daily. 01/07/19   Berton Mount I, MD  fluticasone furoate-vilanterol (BREO ELLIPTA) 100-25 MCG/INH AEPB Inhale 1 puff into the lungs daily. 10/05/20   Parrett, Virgel Bouquet, NP  gabapentin (NEURONTIN) 300 MG capsule Take one tablet in the morning, one tablet in the afternoon, and 2 tablets at bedtime 10/01/22   Philip Aspen, Limmie Patricia, MD  hydrALAZINE (APRESOLINE) 25 MG tablet Take 1 tablet (25 mg total) by mouth 4 (four) times daily. 06/26/22 09/24/22  Georgeanna Lea, MD  insulin glargine (LANTUS SOLOSTAR) 100 UNIT/ML Solostar Pen Inject 8 Units into the skin daily. 09/29/21   Kristian Covey, MD  KLOR-CON M20 20 MEQ tablet TAKE 1 TABLET BY MOUTH EVERY DAY 11/20/21   Philip Aspen, Limmie Patricia, MD   montelukast (SINGULAIR) 10 MG tablet Take 10 mg by mouth at bedtime.     [provider]  RESTASIS 0.05 % ophthalmic emulsion Place 1 drop into both eyes 2 (two) times daily as needed (dry eyes). 12/09/19   [provider]  torsemide (DEMADEX) 20 MG tablet Take 1 tablet (20 mg total) by mouth daily. Start taking from Monday only 11/18/21   Burnadette Pop, MD      Allergies    Patient has no known allergies.    Review of Systems   Review of Systems  Physical Exam Updated Vital Signs BP (!) 175/72 (BP Location: Right Arm)   Pulse 85   Temp 98.5 F (36.9 C)   Resp 18   Ht 5\' 10"  (1.778 m)   Wt 79.4 kg   SpO2 99%   BMI 25.11 kg/m  Physical Exam Vitals and nursing note reviewed.  Constitutional:      General: He is not in acute distress.    Appearance: He is well-developed.  HENT:     Head: Normocephalic and atraumatic.  Eyes:     Conjunctiva/sclera: Conjunctivae normal.     Pupils: Pupils are equal, round, and reactive to light.  Cardiovascular:     Rate and Rhythm: Normal rate and regular rhythm. Frequent Extrasystoles are present.    Pulses: Normal pulses.     Heart sounds: Murmur heard.     Systolic murmur is present with a grade of 2/6.  Pulmonary:     Effort: Pulmonary effort is normal. No respiratory distress.     Breath sounds: Normal breath sounds. No wheezing or rales.  Abdominal:     General: There is no distension.     Palpations: Abdomen is soft.     Tenderness: There is no abdominal tenderness. There is no guarding or rebound.  Musculoskeletal:        General: No tenderness. Normal range of motion.     Cervical back: Normal range of motion and neck supple.     Right lower leg: No edema.     Left lower leg: No edema.  Skin:    General: Skin is warm and dry.     Findings: No erythema or rash.  Neurological:     Mental Status: He is alert and oriented to person, place, and time.  Psychiatric:        Behavior: Behavior normal.      ED Results / Procedures / Treatments   Labs (all labs ordered are listed, but only abnormal results are displayed) Labs Reviewed  BASIC METABOLIC PANEL - Abnormal; Notable for the following components:      Result Value   Glucose, Bld 165 (*)    BUN 25 (*)  All other components within normal limits  CBC - Abnormal; Notable for the following components:   RBC 3.94 (*)    Hemoglobin 11.9 (*)    HCT 35.6 (*)    RDW 16.3 (*)    All other components within normal limits  TROPONIN I (HIGH SENSITIVITY)  TROPONIN I (HIGH SENSITIVITY)    EKG EKG Interpretation Date/Time:  Friday October 19 2022 14:14:18 EDT Ventricular Rate:  86 PR Interval:  280 QRS Duration:  134 QT Interval:  402 QTC Calculation: 481 R Axis:   95  Text Interpretation: Sinus rhythm with 1st degree A-V block with Premature atrial complexes with Abberant conduction Rightward axis Non-specific intra-ventricular conduction block No significant change since last tracing When compared with ECG of 27-Aug-2022 13:40, Premature ventricular complexes are no longer Present Abberant conduction is now Present Confirmed by Gwyneth Sprout ([id number]) on 10/19/2022 3:10:40 PM  Radiology DG Chest Port 1 View  Result Date: 10/19/2022 CLINICAL DATA:  Bradycardia. EXAM: PORTABLE CHEST 1 VIEW COMPARISON:  January 26, 2022 FINDINGS: The heart size and mediastinal contours are within normal limits. Both lungs are clear. A chronic ninth right rib fracture is seen. Multilevel degenerative changes seen throughout the thoracic spine. IMPRESSION: No active cardiopulmonary disease. Electronically Signed   By: Aram Candela M.D.   On: 10/19/2022 16:41    Procedures Procedures    Medications Ordered in ED Medications - No data to display  ED Course/ Medical Decision Making/ A&P                                 Medical Decision Making Amount and/or Complexity of Data Reviewed Labs: ordered. Decision-making details documented in  ED Course. Radiology: ordered and independent interpretation performed. Decision-making details documented in ED Course. ECG/medicine tests: ordered and independent interpretation performed. Decision-making details documented in ED Course.   Pt with multiple medical problems and comorbidities and presenting today with a complaint that caries a high risk for morbidity and mortality.  Here today with concern that his heart rate is low.  Patient reports the symptoms started after going to the dentist yesterday.  His home pulse ox monitor was reading heart rates of 30s to 40s but blood pressure had been normal.  He does not have any specific complaints but feels like he is not at 100%.  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  He is also on multiple medications for heart rate control.  Possibility for recurrent atrial fibrillation, other bradycardia dysrhythmias such as complete heart block, possibility that patient's having frequent PVCs and it is not picking up on pulse ox so gives him an erroneous low read versus electrolyte abnormalities contributing to his symptoms.  Low suspicion for anemia, ACS.  I independently interpreted patient's EKG today which does show frequent PVCs but no other acute changes.  Patient was on cardiac monitoring for over an hour and heart rate never went below the 70s.  At times he has more frequent PVCs but remains in a sinus rhythm.  Suspect patient's pulse ox monitor was not picking up his PVCs.  4:49 PM I independently interpreted patient's labs and CBC, BMP and troponin are all reassuring. I have independently visualized and interpreted pt's images today.  CXR wnl.  Patient has now been on the monitor for 2-1/2 hours and has not had any heart rates less then the 70s.  Suspect  most likely he was having PVCs and the pulse ox was not picking it up.  Discussed this with the patient and his son.  He has follow-up with his  electrophysiologist in a week and a half and encouraged him to keep that appointment but for the time being to continue on all of his normal medications.  He was comfortable with this plan and is stable for discharge at this time.  He does not have any needs for admission at this time but was given return precautions.        Final Clinical Impression(s) / ED Diagnoses Final diagnoses:  PVC's (premature ventricular contractions)    Rx / DC Orders ED Discharge Orders     None         Gwyneth Sprout, MD 10/19/22 1649    Gwyneth Sprout, MD 10/19/22 1650

## 2022-10-19 NOTE — ED Notes (Signed)
Pt discharged home and given discharge paperwork. Opportunities given for questions. Pt verbalizes understanding. PIV removed x1. Stone,Heather R , RN 

## 2022-10-19 NOTE — ED Triage Notes (Signed)
Last night pt was feeling light headed and checked bp and pulse. Pulse ox showed 39 for pulse and normal BP. Today Pulse was also in 30 and 40s but pt was not light headed. Pt has history of afib but has not been in afib for a year. During triage pt Hr was 88 then dropped to 44. Pt asymptomatic.Denies chest pain SOB. Denies pain

## 2022-10-19 NOTE — Discharge Instructions (Addendum)
All the blood work looks normal today.  You are having frequent PVCs but no other abnormalities on your EKG.  If however you have any episodes of passing out start feeling dizzy or lightheaded frequently or develop any chest pain or shortness of breath return to the emergency room.

## 2022-10-31 ENCOUNTER — Telehealth: Payer: Self-pay | Admitting: *Deleted

## 2022-10-31 NOTE — Telephone Encounter (Signed)
Transition Care Management Unsuccessful Follow-up Telephone Call  Date of discharge and from where:  [facility]  10/19/2022  Attempts:  1st Attempt  Reason for unsuccessful TCM follow-up call:  No answer/busy

## 2022-11-02 ENCOUNTER — Ambulatory Visit: Payer: Medicare Other | Admitting: Cardiology

## 2022-11-06 ENCOUNTER — Other Ambulatory Visit: Payer: Self-pay | Admitting: Internal Medicine

## 2022-11-06 DIAGNOSIS — R0602 Shortness of breath: Secondary | ICD-10-CM

## 2022-11-08 ENCOUNTER — Other Ambulatory Visit: Payer: Self-pay | Admitting: Internal Medicine

## 2022-12-10 ENCOUNTER — Encounter: Payer: Self-pay | Admitting: Cardiology

## 2022-12-10 ENCOUNTER — Ambulatory Visit: Payer: Medicare Other | Attending: Cardiology | Admitting: Cardiology

## 2022-12-10 VITALS — BP 172/92 | HR 79 | Ht 70.0 in | Wt 176.0 lb

## 2022-12-10 DIAGNOSIS — I4819 Other persistent atrial fibrillation: Secondary | ICD-10-CM

## 2022-12-10 DIAGNOSIS — Z79899 Other long term (current) drug therapy: Secondary | ICD-10-CM

## 2022-12-10 DIAGNOSIS — D6869 Other thrombophilia: Secondary | ICD-10-CM

## 2022-12-10 DIAGNOSIS — I5022 Chronic systolic (congestive) heart failure: Secondary | ICD-10-CM | POA: Diagnosis not present

## 2022-12-10 DIAGNOSIS — I10 Essential (primary) hypertension: Secondary | ICD-10-CM | POA: Diagnosis not present

## 2022-12-10 MED ORDER — IRBESARTAN 150 MG PO TABS: 150.0000 mg | ORAL_TABLET | Freq: Every day | ORAL | 6 refills | Status: DC

## 2022-12-10 MED ORDER — AMLODIPINE BESYLATE 10 MG PO TABS: 10.0000 mg | ORAL_TABLET | Freq: Every day | ORAL | 6 refills | Status: DC

## 2022-12-10 NOTE — Patient Instructions (Addendum)
Medication Instructions:  Your physician has recommended you make the following change in your medication:  STOP Hydralazine START Amlodipine 10 mg once daily START Irbesartan 150 mg daily  *If you need a refill on your cardiac medications before your next appointment, please call your pharmacy*   Lab Work: Your physician recommends that you return for lab work in: 2 weeks for a BMET  If you have labs (blood work) drawn today and your tests are completely normal, you will receive your results only by: MyChart Message (if you have MyChart) OR A paper copy in the mail If you have any lab test that is abnormal or we need to change your treatment, we will call you to review the results.   Testing/Procedures: None ordered   Follow-Up: At [facility], you and your health needs are our priority.  As part of our continuing mission to provide you with exceptional heart care, we have created designated Provider Care Teams.  These Care Teams include your primary Cardiologist (physician) and Advanced Practice Providers (APPs -  Physician Assistants and Nurse Practitioners) who all work together to provide you with the care you need, when you need it.   Your next appointment:   6 month(s)  The format for your next appointment:   In Person  Provider:   Loman Brooklyn, MD    Thank you for choosing [facility]!!   Dory Horn, RN [phone]  Other Instructions  Irbesartan Tablets What is this medication? IRBESARTAN (ir be SAR tan) treats high blood pressure. It works by relaxing blood vessels, which decreases the amount of work the heart has to do. It may also be used to prevent kidney damage in people with diabetes. It belongs to a group of medications called ARBs. This medicine may be used for other purposes; ask your health care provider or pharmacist if you have questions. COMMON BRAND NAME(S): Avapro What should I tell my care team before I take this medication? They  need to know if you have any of these conditions: Heart failure Kidney or liver disease An unusual or allergic reaction to irbesartan, other medications, foods, dyes, or preservatives Pregnant or trying to get pregnant Breast-feeding How should I use this medication? Take this medication by mouth. Take it as directed on the prescription label at the same time every day. You can take it with or without food. If it upsets your stomach, take it with food. Keep taking it unless your care team tells you to stop. Talk to your care team about the use of this medication in children. Special care may be needed. Overdosage: If you think you have taken too much of this medicine contact a poison control center or emergency room at once. NOTE: This medicine is only for you. Do not share this medicine with others. What if I miss a dose? If you miss a dose, take it as soon as you can. If it is almost time for your next dose, take only that dose. Do not take double or extra doses. What may interact with this medication? This medication may interact with the following: Diuretics, especially triamterene, spironolactone, or amiloride Potassium salts or potassium supplements This list may not describe all possible interactions. Give your health care provider a list of all the medicines, herbs, non-prescription drugs, or dietary supplements you use. Also tell them if you smoke, drink alcohol, or use illegal drugs. Some items may interact with your medicine. What should I watch for while using this medication?  Visit your care team for regular checks on your progress. Check your blood pressure as directed. Ask your care team what your blood pressure should be and when you should contact them. Call your care team if you notice an irregular or fast heart beat. Women should inform their care team if they wish to become pregnant or think they might be pregnant. There is a potential for serious side effects to an unborn  child, particularly in the second or third trimester. Talk to your care team or pharmacist for more information. You may get drowsy or dizzy. Do not drive, use machinery, or do anything that needs mental alertness until you know how this medication affects you. Do not stand or sit up quickly, especially if you are an older patient. This reduces the risk of dizzy or fainting spells. Alcohol can make you more drowsy and dizzy. Avoid alcoholic drinks. Avoid salt substitutes unless you are told otherwise by your care team. Do not treat yourself for coughs, colds, or pain while you are taking this medication without asking your care team for advice. Some ingredients may increase your blood pressure. What side effects may I notice from receiving this medication? Side effects that you should report to your care team as soon as possible: Allergic reactions--skin rash, itching, hives, swelling of the face, lips, tongue, or throat High potassium level--muscle weakness, fast or irregular heartbeat Kidney injury--decrease in the amount of urine, swelling of the ankles, hands, or feet Low blood pressure--dizziness, feeling faint or lightheaded, blurry vision Side effects that usually do not require medical attention (report to your care team if they continue or are bothersome): Diarrhea Dizziness Fatigue Upset stomach This list may not describe all possible side effects. Call your doctor for medical advice about side effects. You may report side effects to FDA at 1-800-FDA-1088. Where should I keep my medication? Keep out of the reach of children and pets. Store at room temperature between 15 and 30 degrees C (59 and 86 degrees F). Throw away any unused medication after the expiration date. NOTE: This sheet is a summary. It may not cover all possible information. If you have questions about this medicine, talk to your doctor, pharmacist, or health care provider.  2024 Elsevier/Gold Standard (2021-03-09  00:00:00)   Amlodipine Tablets What is this medication? AMLODIPINE (am LOE di peen) treats high blood pressure and prevents chest pain (angina). It works by relaxing the blood vessels, which helps decrease the amount of work your heart has to do. It belongs to a group of medications called calcium channel blockers. This medicine may be used for other purposes; ask your health care provider or pharmacist if you have questions. COMMON BRAND NAME(S): Norvasc What should I tell my care team before I take this medication? They need to know if you have any of these conditions: Heart disease Liver disease An unusual or allergic reaction to amlodipine, other medications, foods, dyes, or preservatives Pregnant or trying to get pregnant Breastfeeding How should I use this medication? Take this medication by mouth. Take it as directed on the prescription label at the same time every day. You can take it with or without food. If it upsets your stomach, take it with food. Keep taking it unless your care team tells you to stop. Talk to your care team about the use of this medication in children. While it may be prescribed for children as young as 6 for selected conditions, precautions do apply. Overdosage: If you think you have  taken too much of this medicine contact a poison control center or emergency room at once. NOTE: This medicine is only for you. Do not share this medicine with others. What if I miss a dose? If you miss a dose, take it as soon as you can. If it is almost time for your next dose, take only that dose. Do not take double or extra doses. What may interact with this medication? Clarithromycin Cyclosporine Diltiazem Itraconazole Simvastatin Tacrolimus This list may not describe all possible interactions. Give your health care provider a list of all the medicines, herbs, non-prescription drugs, or dietary supplements you use. Also tell them if you smoke, drink alcohol, or use illegal  drugs. Some items may interact with your medicine. What should I watch for while using this medication? Visit your care team for regular checks on your progress. Check your blood pressure as directed. Know what your blood pressure should be and when to contact your care team. Do not treat yourself for coughs, colds, or pain while you are using this medication without asking your care team for advice. Some medications may increase your blood pressure. This medication may affect your coordination, reaction time, or judgment. Do not drive or operate machinery until you know how this medication affects you. Sit up or stand slowly to reduce the risk of dizzy or fainting spells. Drinking alcohol with this medication can increase the risk of these side effects. What side effects may I notice from receiving this medication? Side effects that you should report to your care team as soon as possible: Allergic reactions--skin rash, itching, hives, swelling of the face, lips, tongue, or throat Heart attack--pain or tightness in the chest, shoulders, arms, or jaw, nausea, shortness of breath, cold or clammy skin, feeling faint or lightheaded Low blood pressure--dizziness, feeling faint or lightheaded, blurry vision Worsening chest pain (angina)--pain, pressure, or tightness in the chest, neck, back, or arms Side effects that usually do not require medical attention (report these to your care team if they continue or are bothersome): Facial flushing, redness Heart palpitations--rapid, pounding, or irregular heartbeat Nausea Stomach pain Swelling of the ankles, hands, or feet This list may not describe all possible side effects. Call your doctor for medical advice about side effects. You may report side effects to FDA at 1-800-FDA-1088. Where should I keep my medication? Keep out of the reach of children and pets. Store at room temperature between 20 and 25 degrees C (68 and 77 degrees F). Protect from light and  moisture. Keep the container tightly closed. Get rid of any unused medication after the expiration date. To get rid of medications that are no longer needed or have expired: Take the medication to a medication take-back program. Check with your pharmacy or law enforcement to find a location. If you cannot return the medication, check the label or package insert to see if the medication should be thrown out in the garbage or flushed down the toilet. If you are not sure, ask your care team. If it is safe to put in the trash, empty the medication out of the container. Mix the medication with cat litter, dirt, coffee grounds, or other unwanted substance. Seal the mixture in a bag or container. Put it in the trash. NOTE: This sheet is a summary. It may not cover all possible information. If you have questions about this medicine, talk to your doctor, pharmacist, or health care provider.  2024 Elsevier/Gold Standard (2021-09-04 00:00:00)

## 2022-12-10 NOTE — Progress Notes (Signed)
Electrophysiology Office Note:   Date:  12/10/2022  ID:  Charles Fanny., DOB 25-Feb-1941, MRN [id number]  Primary Cardiologist: Norman Herrlich, MD Electrophysiologist: Regan Lemming, MD      History of Present Illness:   Charles Kisiel. is a 82 y.o. male with h/o atrial fibrillation, diabetes, hyperlipidemia seen today for routine electrophysiology followup.   Charles had A-fib ablation in 2015 at [facility].  Charles had repeat ablation 03/11/2020 and third ablation 04/29/2020.  Charles is now on flecainide for recurrent atrial fibrillation.  Since last being seen in our clinic the patient reports doing overall well.  Charles is able to do all of his daily activities with only limitations being back pain.  Charles is getting injections to try and hold off from surgery..  Charles denies chest pain, palpitations, dyspnea, PND, orthopnea, nausea, vomiting, dizziness, syncope, edema, weight gain, or early satiety.   Review of systems complete and found to be negative unless listed in HPI.   EP Information / Studies Reviewed:    EKG is ordered today. Personal review as below.  EKG Interpretation Date/Time:  Monday December 10 2022 15:53:02 EDT Ventricular Rate:  79 PR Interval:  272 QRS Duration:  134 QT Interval:  408 QTC Calculation: 467 R Axis:   48  Text Interpretation: Sinus rhythm with 1st degree A-V block Non-specific intra-ventricular conduction block When compared with ECG of 19-Oct-2022 14:14, Since previous tracing Premature ventricular complexes is no longer Confirmed by Jerrell Hart ([id number]) on 12/10/2022 3:57:23 PM     Risk Assessment/Calculations:    CHA2DS2-VASc Score = 6   This indicates a 9.7% annual risk of stroke. The patient's score is based upon: CHF History: 1 HTN History: 1 Diabetes History: 1 Stroke History: 0 Vascular Disease History: 1 Age Score: 2 Gender Score: 0            Physical Exam:   VS:  BP (!) 164/90   Pulse 79   Ht 5\' 10"  (1.778 m)   Wt 176 lb (79.8 kg)    SpO2 95%   BMI 25.25 kg/m    Wt Readings from Last 3 Encounters:  12/10/22 176 lb (79.8 kg)  10/19/22 175 lb (79.4 kg)  10/01/22 178 lb 11.2 oz (81.1 kg)     GEN: Well nourished, well developed in no acute distress NECK: No JVD; No carotid bruits CARDIAC: Regular rate and rhythm, no murmurs, rubs, gallops RESPIRATORY:  Clear to auscultation without rales, wheezing or rhonchi  ABDOMEN: Soft, non-tender, non-distended EXTREMITIES:  No edema; No deformity   ASSESSMENT AND PLAN:    1.  Persistent atrial fibrillation: Post ablation x 3, most recently 04/29/2020.  Currently on flecainide.  Mains in sinus rhythm.  2.  Chronic systolic heart failure: Ejection fraction has improved with maintenance of sinus rhythm  3.  Hypertension: Blood pressure is elevated today.  It is elevated at home as well.  Charles Lloyd stop hydralazine 150 mg.  Charles Lloyd come back in 2 weeks for a BMP.  4.  Hyperlipidemia: Continue statin per primary cardiology  5.  Secondary hypercoagulable state: Currently on Eliquis for atrial fibrillation  Follow up with Dr. Elberta Fortis in 6 months  Signed, Tanice Petre Jorja Loa, MD

## 2022-12-21 ENCOUNTER — Ambulatory Visit (INDEPENDENT_AMBULATORY_CARE_PROVIDER_SITE_OTHER): Payer: Medicare Other

## 2022-12-21 ENCOUNTER — Telehealth: Payer: Self-pay | Admitting: Cardiology

## 2022-12-21 VITALS — BP 122/60 | HR 71 | Ht 70.0 in | Wt 182.2 lb

## 2022-12-21 DIAGNOSIS — Z0000 Encounter for general adult medical examination without abnormal findings: Secondary | ICD-10-CM

## 2022-12-21 DIAGNOSIS — Z23 Encounter for immunization: Secondary | ICD-10-CM

## 2022-12-21 NOTE — Patient Instructions (Addendum)
Mr. Charles Lloyd , Thank you for taking time to come for your Medicare Wellness Visit. I appreciate your ongoing commitment to your health goals. Please review the following plan we discussed and let me know if I can assist you in the future.   Referrals/Orders/Follow-Ups/Clinician Recommendations:   This is a list of the screening recommended for you and due dates:  Health Maintenance  Topic Date Due   Complete foot exam   Never done   Yearly kidney health urinalysis for diabetes  Never done   Eye exam for diabetics  07/11/2022   COVID-19 Vaccine (4 - 2023-24 season) 10/28/2022   Hemoglobin A1C  01/12/2023   Yearly kidney function blood test for diabetes  10/19/2023   Medicare Annual Wellness Visit  12/21/2023   DTaP/Tdap/Td vaccine (3 - Td or Tdap) 12/31/2030   Pneumonia Vaccine  Completed   Flu Shot  Completed   Hepatitis C Screening  Completed   Zoster (Shingles) Vaccine  Completed   HPV Vaccine  Aged Out   Colon Cancer Screening  Discontinued    Advanced directives: (Declined) Advance directive discussed with you today. Even though you declined this today, please call our office should you change your mind, and we can give you the proper paperwork for you to fill out.  Next Medicare Annual Wellness Visit scheduled for next year: Yes

## 2022-12-21 NOTE — Progress Notes (Signed)
Subjective:   Charles Lloyd. is a 82 y.o. male who presents for Medicare Annual/Subsequent preventive examination.  Visit Complete: In person    Cardiac Risk Factors include: advanced age (>27men, >75 women);male gender;diabetes mellitus;hypertension     Objective:    Today's Vitals   12/21/22 1439  BP: 122/60  Pulse: 71  SpO2: 95%  Weight: 182 lb 3.2 oz (82.6 kg)  Height: 5\' 10"  (1.778 m)   Body mass index is 26.14 kg/m.     12/21/2022    2:48 PM 10/19/2022    2:15 PM 09/23/2022    8:41 AM 01/26/2022   11:07 AM 11/29/2021    1:07 PM 11/16/2021    9:29 PM 11/16/2021    4:09 PM  Advanced Directives  Does Patient Have a Medical Advance Directive? No No No No No No No  Would patient like information on creating a medical advance directive? No - Patient declined  No - Patient declined  No - Patient declined No - Patient declined No - Patient declined    Current Medications (verified) Outpatient Encounter Medications as of 12/21/2022  Medication Sig   ACCU-CHEK AVIVA PLUS test strip 1 EACH BY OTHER ROUTE DAILY. DX E11.9 (Patient taking differently: 2 each by Other route daily. Dx E11.9)   albuterol (VENTOLIN HFA) 108 (90 Base) MCG/ACT inhaler Inhale 1-2 puffs into the lungs every 6 (six) hours as needed for wheezing or shortness of breath.   allopurinol (ZYLOPRIM) 300 MG tablet TAKE 1 TABLET BY MOUTH EVERY DAY   amLODipine (NORVASC) 10 MG tablet Take 1 tablet (10 mg total) by mouth daily.   apixaban (ELIQUIS) 5 MG TABS tablet TAKE 1 TABLET TWICE A DAY   atorvastatin (LIPITOR) 20 MG tablet TAKE 1 TABLET DAILY (PLEASE SCHEDULE A PHYSICAL FOR MORE REFILLS)   carvedilol (COREG) 3.125 MG tablet TAKE 1 TABLET BY MOUTH TWICE A DAY   colchicine 0.6 MG tablet Take 1 tablet (0.6 mg total) by mouth 2 (two) times daily.   Continuous Blood Gluc Receiver (FREESTYLE LIBRE 2 READER) DEVI 1 each by Does not apply route daily.   Continuous Blood Gluc Sensor (FREESTYLE LIBRE 2 SENSOR)  MISC 1 each by Does not apply route daily.   doxycycline (VIBRAMYCIN) 100 MG capsule Take 1 capsule (100 mg total) by mouth 2 (two) times daily.   flecainide (TAMBOCOR) 100 MG tablet Take 1 tablet (100 mg total) by mouth 2 (two) times daily.   fluticasone (FLONASE) 50 MCG/ACT nasal spray Place 2 sprays into both nostrils daily.   fluticasone furoate-vilanterol (BREO ELLIPTA) 100-25 MCG/INH AEPB Inhale 1 puff into the lungs daily.   gabapentin (NEURONTIN) 300 MG capsule Take one tablet in the morning, one tablet in the afternoon, and 2 tablets at bedtime   insulin glargine (LANTUS SOLOSTAR) 100 UNIT/ML Solostar Pen Inject 8 Units into the skin daily.   irbesartan (AVAPRO) 150 MG tablet Take 1 tablet (150 mg total) by mouth daily.   KLOR-CON M20 20 MEQ tablet TAKE 1 TABLET BY MOUTH EVERY DAY   montelukast (SINGULAIR) 10 MG tablet Take 10 mg by mouth at bedtime.    RESTASIS 0.05 % ophthalmic emulsion Place 1 drop into both eyes 2 (two) times daily as needed (dry eyes).   torsemide (DEMADEX) 20 MG tablet Take 1 tablet (20 mg total) by mouth daily. Start taking from Monday only   No facility-administered encounter medications on file as of 12/21/2022.    Allergies (verified) Patient has no known allergies.  History: Past Medical History:  Diagnosis Date   Acute tracheobronchitis 01/05/2019   B12 deficiency    Basal cell carcinoma (BCC) of left side of nose 01/28/2018   Cancer (HCC)    Cardiomyopathy (HCC) 03/10/2018   With chronic atrial fibrillation   CHF (congestive heart failure) (HCC) 04/16/2013   Functional class II, ejection fraction 35-40%  Formatting of this note might be different from the original. Functional class II, ejection fraction 35-40%   Chronic anticoagulation    Chronic diastolic (congestive) heart failure (HCC) 04/16/2013   Functional class II, ejection fraction 35-40%  Last Assessment & Plan:  Clinically stable Volume well controlled meds reviewed   Chronic  diastolic CHF (congestive heart failure) (HCC) 04/16/2013   Functional class II, ejection fraction 35-40%  Last Assessment & Plan:  Clinically stable Volume well controlled meds reviewed   Colon polyps 04/16/2013   Coronary artery disease involving native coronary artery of native heart without angina pectoris 04/05/2022   Myoview 05/25/2019: EF 60, normal perfusion, low risk CCTA (AF protocol) 04/25/20: CAC score 4242 (99th percentile)   Diabetic neuropathy (HCC)    Eosinophil count raised 02/17/2019   Essential hypertension 01/06/2010   Last Assessment & Plan:  Well controlled Continue med management   Gout    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  Steroid usage was discontinued several months ago.  Follow up with Dermatology as planned.   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   Last Assessment & Plan:  Id following ?ongoing abx managementy reported Request records   Infection of prosthetic right knee joint (HCC)    Insomnia 03/04/2012   Grief with loss of wife 02/20/12  Last Assessment & Plan:  Improved sx  contineu xanax prn Se discussed   Lesion of liver 04/20/2019   -On cardiac CT 02/2019   Mild reactive airways disease    Neoplasm of prostate 04/16/2013   Neoplasm of prostate, 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 Transthoracic Echocardiogram-03/10/2013-[facility]: Normal left ventricular wall thickness and cavity size.  Global left ventricular systolic function is moderately reduced.  The estimated ejection fraction is 35-40%.  The left atrium is moderately enlarged.  The right atrium is mildly enlarged.  No significant valvular    Overweight (BMI 25.0-29.9) 10/22/2016   Persistent atrial fibrillation (HCC) 04/16/2013   Last Assessment & Plan:  Rate controlled Continue med management eliquis for stroke preventino  Formatting of this note might be different from the original.  Drug  HX Current Rx Pre-ABL inefficacy Pre-ABL intolerant Post-ABL inefficacy Post-ABL intolerant max dose/24h M/Y end comments  sotalol                  dofetilide                  flecainide  propafenone                  am   S/P TKR (total knee replacement), bilateral    Secondary hypercoagulable state (HCC) 04/09/2019   Tinea cruris 04/16/2013   Type 2 diabetes mellitus with diabetic polyneuropathy, without long-term current use of insulin (HCC) 04/16/2013   Last Assessment & Plan:  Labs today Pt reports well controlled on ambulatory monitoring   Vitamin D deficiency 07/25/2018   Past Surgical History:  Procedure Laterality Date   ATRIAL FIBRILLATION ABLATION N/A 03/12/2019   Procedure: ATRIAL FIBRILLATION ABLATION;  Surgeon: Regan Lemming, MD;  Location: [facility] INVASIVE CV LAB;  Service: Cardiovascular;  Laterality: N/A;   ATRIAL FIBRILLATION ABLATION N/A 04/29/2020   Procedure: ATRIAL FIBRILLATION ABLATION;  Surgeon: Regan Lemming, MD;  Location: [facility] INVASIVE CV LAB;  Service: Cardiovascular;  Laterality: N/A;   CARDIAC ELECTROPHYSIOLOGY STUDY AND ABLATION     CARDIOVERSION     CARDIOVERSION N/A 10/10/2018   Procedure: CARDIOVERSION;  Surgeon: Wendall Stade, MD;  Location[facility] ENDOSCOPY;  Service: Cardiovascular;  Laterality: N/A;   CARDIOVERSION N/A 05/18/2020   Procedure:  CARDIOVERSION;  Surgeon: Vesta Mixer, MD;  Location: [facility] ENDOSCOPY;  Service: Cardiovascular;  Laterality: N/A;   CARDIOVERSION N/A 12/09/2020   Procedure: CARDIOVERSION;  Surgeon: Lewayne Bunting, MD;  Location: [facility] ENDOSCOPY;  Service: Cardiovascular;  Laterality: N/A;   CHOLECYSTECTOMY     PROSTATECTOMY     REPLACEMENT TOTAL KNEE BILATERAL     Family History  Problem Relation Age of Onset   Cancer Mother    Depression Mother    Early death Mother    Cancer Father    Depression Father    Early death Father    Social History   Socioeconomic History   Marital status: Widowed    Spouse name: Not on file   Number of children: Not on file   Years of education: Not on file   Highest education level: Not on file  Occupational History   Not on file  Tobacco Use   Smoking status: Never    Passive exposure: Never   Smokeless tobacco: Never  Vaping Use   Vaping status: Never Used  Substance and Sexual Activity   Alcohol use: Yes    Alcohol/week: 14.0 standard drinks of alcohol    Types: 14 Glasses of wine per week   Drug use: Never   Sexual activity: Not Currently  Other Topics Concern   Not on file  Social History Narrative   Not on file   Social Determinants of Health   Financial Resource Strain: Low Risk  (12/21/2022)   Overall Financial Resource Strain (CARDIA)    Difficulty of Paying Living Expenses: Not hard at all  Food Insecurity: No Food Insecurity (12/21/2022)   Hunger Vital Sign    Worried About Running Out of Food in the Last Year: Never true    Ran Out of Food in the Last Year: Never true  Transportation Needs: No Transportation Needs (12/21/2022)   PRAPARE - Administrator, Civil Service (Medical): No    Lack of Transportation (Non-Medical): No  Physical Activity: Inactive (12/21/2022)   Exercise Vital Sign    Days of Exercise per Week: 0 days    Minutes of Exercise per Session: 0 min  Stress: No Stress Concern Present (12/21/2022)    [employer] of Occupational Health - Occupational Stress Questionnaire    Feeling of Stress : Not at all  Social Connections: Socially Isolated (12/21/2022)   Social Connection and Isolation Panel [NHANES]    Frequency of Communication with Friends and Family: More than three times a week    Frequency of Social Gatherings with Friends and Family: More than three times a week    Attends Religious Services: Never    Database administrator or Organizations: No    Attends Banker Meetings: Never    Marital Status: Widowed    Tobacco Counseling Counseling given: Not Answered   Clinical Intake:  Pre-visit preparation completed: Yes  Pain : No/denies pain     BMI - recorded: 26.14 Nutritional Status: BMI 25 -29 Overweight Nutritional Risks: None Diabetes: Yes CBG done?: No Did pt. bring in CBG monitor from home?: No  How often do you need to have someone help you when you read instructions, pamphlets, or other written materials from your doctor or pharmacy?: 1 - Never  Interpreter Needed?: No  Information entered by :: Theresa Mulligan LPN   Activities of Daily Living    12/21/2022    2:46 PM  In your present state of health, do you have any difficulty performing the following activities:  Hearing? 0  Vision? 0  Difficulty concentrating or making decisions? 0  Walking or climbing stairs? 0  Dressing or bathing? 0  Doing errands, shopping? 0  Preparing Food and eating ? N  Using the Toilet? N  In the past six months, have you accidently leaked urine? N  Do you have problems with loss of bowel control? N  Managing your Medications? N  Managing your Finances? N  Housekeeping or managing your Housekeeping? N    Patient Care Team: Philip Aspen, Limmie Patricia, MD as PCP - General (Internal Medicine) Regan Lemming, MD as PCP - Electrophysiology (Cardiology) Baldo Daub, MD as PCP - Cardiology (Cardiology)  Indicate any recent Medical  Services you may have received from other than [facility] providers in the past year (date may be approximate).     Assessment:   This is a routine wellness examination for Charles Lloyd.  Hearing/Vision screen Hearing Screening - Comments:: Denies hearing difficulties   Vision Screening - Comments:: Wears reading glasses - up to date with routine eye exams with  Dr Dione Booze   Goals Addressed               This Visit's Progress     Stay Alive! (pt-stated)         Depression Screen    12/21/2022    2:45 PM 02/01/2022    4:32 PM 12/04/2021   11:49 AM 11/15/2021    2:56 PM 07/18/2021   10:13 AM 06/19/2021    2:07 PM 01/05/2021    4:04 PM  PHQ 2/9 Scores  PHQ - 2 Score 0 0 3 0 0 5 0  PHQ- 9 Score  0 7  0 9 2    Fall Risk    12/21/2022    2:47 PM 07/12/2022   10:33 AM 02/01/2022    4:32 PM 12/04/2021   11:50 AM 11/15/2021    2:55 PM  Fall Risk   Falls in the past year? 0 0 1 1 1   Number falls in past yr: 0 0 1 1 0  Injury with Fall? 0 0 0 0 0  Comment    Patient commented "bruising"   Risk for fall due to : No Fall Risks No Fall Risks Impaired balance/gait Impaired balance/gait History of fall(s)  Follow up  Falls prevention discussed Falls evaluation completed Falls evaluation completed Falls evaluation completed Falls evaluation completed    MEDICARE RISK AT HOME: Medicare Risk at Home Any stairs in or around the home?: Yes If so, are there any without handrails?: No Home free of loose throw rugs in walkways, pet beds, electrical cords, etc?: Yes Adequate lighting in your home to reduce risk of falls?: Yes Life alert?: No Use of a cane, walker or w/c?: Yes Grab bars in the bathroom?: No Shower chair or bench in shower?: No Elevated toilet seat or a handicapped toilet?: No  TIMED UP AND GO:  Was the test performed?  Yes  Length of time to ambulate 10 feet: 10 sec Gait steady and fast with assistive device    Cognitive Function:        12/21/2022    2:48 PM  6CIT Screen   What Year? 0 points  What month? 0 points  What time? 0 points  Count back from 20 0 points  Months in reverse 0 points  Repeat phrase 0 points  Total Score 0 points    Immunizations Immunization History  Administered Date(s) Administered   Fluad Quad(high Dose 65+) 11/25/2018, 03/25/2020, 11/23/2020, 11/15/2021   Fluad Trivalent(High Dose 65+) 12/21/2022   Influenza, High Dose Seasonal PF 11/21/2010, 01/19/2013, 02/01/2014, 02/07/2015, 12/28/2015, 10/22/2016, 01/28/2018   Influenza,trivalent, recombinat, inj, PF 01/11/2012   Influenza-Unspecified 12/09/2009   PFIZER(Purple Top)SARS-COV-2 Vaccination 04/19/2019, 05/13/2019, 11/21/2019   Pneumococcal Conjugate-13 01/19/2013   Pneumococcal Polysaccharide-23 12/01/2008   Tdap 10/28/2006, 12/30/2020   Zoster Recombinant(Shingrix) 01/28/2018, 07/24/2018   Zoster, Live 10/28/2006    TDAP status: Up to date  Flu Vaccine status: Completed at today's visit  Pneumococcal vaccine status: Up to date  Covid-19 vaccine status: Declined, Education has been provided regarding the importance of this vaccine but patient still declined. Advised may receive this vaccine at local pharmacy or Health Dept.or vaccine clinic. Aware to provide a copy of the vaccination record if obtained from local pharmacy or Health Dept. Verbalized acceptance and understanding.  Qualifies for Shingles Vaccine? Yes   Zostavax completed Yes   Shingrix Completed?: Yes  Screening Tests Health Maintenance  Topic Date Due   FOOT EXAM  Never done   Diabetic kidney evaluation - Urine ACR  Never done   OPHTHALMOLOGY EXAM  07/11/2022   COVID-19 Vaccine (4 - 2023-24 season) 10/28/2022   HEMOGLOBIN A1C  01/12/2023   Diabetic kidney evaluation - eGFR measurement  10/19/2023   Medicare Annual Wellness (AWV)  12/21/2023   DTaP/Tdap/Td (3 - Td or Tdap) 12/31/2030   Pneumonia Vaccine 32+ Years old  Completed   INFLUENZA VACCINE  Completed   Hepatitis C Screening   Completed   Zoster Vaccines- Shingrix  Completed   HPV VACCINES  Aged Out   Colonoscopy  Discontinued    Health Maintenance  Health Maintenance Due  Topic Date Due   FOOT EXAM  Never done   Diabetic kidney evaluation - Urine ACR  Never done   OPHTHALMOLOGY EXAM  07/11/2022   COVID-19 Vaccine (4 - 2023-24 season) 10/28/2022       Additional Screening:  Hepatitis C Screening: does qualify; Completed 08/07/21  Vision Screening: Recommended annual ophthalmology exams for early detection of glaucoma and other disorders of the eye. Is the patient up to date with their annual eye exam?  Yes  Who is the provider or what is the name of the office in which the patient attends annual eye exams? Dr Dione Booze If  pt is not established with a provider, would they like to be referred to a provider to establish care? No .   Dental Screening: Recommended annual dental exams for proper oral hygiene  Diabetic Foot Exam: Diabetic Foot Exam: Overdue, Pt has been advised about the importance in completing this exam. Pt is scheduled for diabetic foot exam on Followed by PCP.  Community Resource Referral / Chronic Care Management:  CRR required this visit?  No   CCM required this visit?  No     Plan:     I have personally reviewed and noted the following in the patient's chart:   Medical and social history Use of alcohol, tobacco or illicit drugs  Current medications and supplements including opioid prescriptions. Patient is not currently taking opioid prescriptions. Functional ability and status Nutritional status Physical activity Advanced directives List of other physicians Hospitalizations, surgeries, and ER visits in previous 12 months Vitals Screenings to include cognitive, depression, and falls Referrals and appointments  In addition, I have reviewed and discussed with patient certain preventive protocols, quality metrics, and best practice recommendations. A written personalized  care plan for preventive services as well as general preventive health recommendations were provided to patient.     Tillie Rung, LPN   [id number]   After Visit Summary: Given Nurse Notes: None

## 2022-12-21 NOTE — Telephone Encounter (Signed)
Pt c/o medication issue:  1. Name of Medication: amLODipine (NORVASC) 10 MG tablet   2. How are you currently taking this medication (dosage and times per day)?  Take 1 tablet (10 mg total) by mouth daily.       3. Are you having a reaction (difficulty breathing--STAT)? No  4. What is your medication issue? Pt is requesting to speak with a nurse regarding this medication not working out for him. He stated it's causing him to have some chest pains recently and swollen ankles. Pt also stated the last time he was on this medication it caused him to have to go to the ED about a year and a half ago. Please advise

## 2022-12-24 NOTE — Telephone Encounter (Signed)
Left detailed message informing pt I was not in the office Friday. Advised to hold Amlodipine and I would send to MD for advisement.

## 2022-12-27 MED ORDER — HYDRALAZINE HCL 25 MG PO TABS: 25.0000 mg | ORAL_TABLET | Freq: Four times a day (QID) | ORAL | Status: DC

## 2022-12-28 NOTE — Telephone Encounter (Signed)
Pt aware Dr. Elberta Fortis recommends trying Chlorthalidone 25 mg daily. Pt reports he has been working with Dr. Dulce Sellar on his BP control and they have recently changed one of his medications. He would like to see how that dose and reach out to Dr. Dulce Sellar for BP control if it does not work. Pt appreciates our follow up/help with this.

## 2023-01-15 ENCOUNTER — Other Ambulatory Visit: Payer: Self-pay | Admitting: Cardiology

## 2023-01-18 ENCOUNTER — Other Ambulatory Visit: Payer: Self-pay | Admitting: Internal Medicine

## 2023-01-18 DIAGNOSIS — M1A0411 Idiopathic chronic gout, right hand, with tophus (tophi): Secondary | ICD-10-CM

## 2023-02-01 ENCOUNTER — Other Ambulatory Visit: Payer: Self-pay | Admitting: Internal Medicine

## 2023-02-02 ENCOUNTER — Other Ambulatory Visit: Payer: Self-pay | Admitting: Cardiology

## 2023-04-02 ENCOUNTER — Other Ambulatory Visit: Payer: Self-pay | Admitting: Cardiology

## 2023-04-02 ENCOUNTER — Telehealth: Payer: Self-pay | Admitting: Cardiology

## 2023-04-02 MED ORDER — HYDRALAZINE HCL 25 MG PO TABS: 25.0000 mg | ORAL_TABLET | Freq: Four times a day (QID) | ORAL | 1 refills | Status: DC

## 2023-04-02 NOTE — Telephone Encounter (Signed)
 Spoke with pt. He stated that he wanted to go back to his previous medications before Dr. Inocencio changed his medications in October. He is needing a refill of Hydralazine  25mg . Per Dr. Bernie ok to refill Hydralazine  &  pt needs appt. For follow up. Will send to front desk for appt.

## 2023-04-02 NOTE — Telephone Encounter (Signed)
 Pt c/o medication issue:  1. Name of Medication:  hydrALAZINE  (APRESOLINE ) 25 MG tablet  2. How are you currently taking this medication (dosage and times per day)?   3. Are you having a reaction (difficulty breathing--STAT)?   4. What is your medication issue?   Patient states he was informed that he doesn't need to take this medication anymore and he would like to clarify. He states he only has a few tablets remaining. Please advise.

## 2023-04-18 ENCOUNTER — Other Ambulatory Visit: Payer: Self-pay | Admitting: Internal Medicine

## 2023-04-19 ENCOUNTER — Other Ambulatory Visit: Payer: Self-pay | Admitting: Internal Medicine

## 2023-04-19 DIAGNOSIS — M1A0411 Idiopathic chronic gout, right hand, with tophus (tophi): Secondary | ICD-10-CM

## 2023-04-22 ENCOUNTER — Other Ambulatory Visit: Payer: Self-pay | Admitting: Internal Medicine

## 2023-04-22 DIAGNOSIS — M1A0411 Idiopathic chronic gout, right hand, with tophus (tophi): Secondary | ICD-10-CM

## 2023-04-22 DIAGNOSIS — G6289 Other specified polyneuropathies: Secondary | ICD-10-CM

## 2023-04-22 MED ORDER — GABAPENTIN 300 MG PO CAPS
ORAL_CAPSULE | ORAL | 2 refills | Status: DC
Start: 2023-04-22 — End: 2023-09-09

## 2023-04-22 NOTE — Telephone Encounter (Signed)
 Last Fill: Gabapentin: 10/01/22       Last OV: 12/21/22 Next OV: 12/27/23  Routing to provider for review/authorization.

## 2023-04-22 NOTE — Telephone Encounter (Signed)
 Copied from CRM [id number]. Topic: Clinical - Medication Refill >> Apr 22, 2023  8:47 AM Drema Balzarine wrote: Most Recent Primary Care Visit:  Provider: Tillie Rung  Department: [facility]  Visit Type: MEDICARE AWV, SEQUENTIAL  Date: 12/21/2022  Medication: gabapentin, allopurinol, carvedilol   Has the patient contacted their pharmacy? Yes (Agent: If no, request that the patient contact the pharmacy for the refill. If patient does not wish to contact the pharmacy document the reason why and proceed with request.) (Agent: If yes, when and what did the pharmacy advise?)  Is this the correct pharmacy for this prescription? Yes If no, delete pharmacy and type the correct one.  This is the patient's preferred pharmacy:  [id number] [place], [place] - [address] [place] [address] Phone: [phone] Fax: [phone]  [facility] - [place], [place] - [address] [place] [address] Phone: [phone] Fax: [phone]  [facility] [address] [place] [address] Phone: [phone] Fax: [phone]   Has the prescription been filled recently? Yes  Is the patient out of the medication? No, but almost out  Has the patient been seen for an appointment in the last year OR does the patient have an upcoming appointment? Yes  Can we respond through MyChart? Yes  Agent: Please be advised that Rx refills may take up to 3 business days. We ask that you follow-up with your pharmacy.

## 2023-04-25 ENCOUNTER — Other Ambulatory Visit: Payer: Self-pay | Admitting: Cardiology

## 2023-05-03 ENCOUNTER — Other Ambulatory Visit: Payer: Self-pay | Admitting: Cardiology

## 2023-06-04 ENCOUNTER — Ambulatory Visit: Payer: Medicare Other | Attending: Cardiology | Admitting: Cardiology

## 2023-06-04 ENCOUNTER — Other Ambulatory Visit: Payer: Self-pay | Admitting: Internal Medicine

## 2023-06-04 ENCOUNTER — Encounter: Payer: Self-pay | Admitting: Cardiology

## 2023-06-04 VITALS — BP 136/84 | HR 89 | Ht 70.0 in | Wt 177.0 lb

## 2023-06-04 DIAGNOSIS — I4819 Other persistent atrial fibrillation: Secondary | ICD-10-CM | POA: Diagnosis not present

## 2023-06-04 DIAGNOSIS — I10 Essential (primary) hypertension: Secondary | ICD-10-CM

## 2023-06-04 DIAGNOSIS — I2510 Atherosclerotic heart disease of native coronary artery without angina pectoris: Secondary | ICD-10-CM

## 2023-06-04 DIAGNOSIS — I480 Paroxysmal atrial fibrillation: Secondary | ICD-10-CM

## 2023-06-04 DIAGNOSIS — I5032 Chronic diastolic (congestive) heart failure: Secondary | ICD-10-CM | POA: Diagnosis not present

## 2023-06-04 DIAGNOSIS — R0609 Other forms of dyspnea: Secondary | ICD-10-CM

## 2023-06-04 MED ORDER — ATORVASTATIN CALCIUM 20 MG PO TABS: 20.0000 mg | ORAL_TABLET | Freq: Every day | ORAL | 0 refills | Status: DC

## 2023-06-04 NOTE — Progress Notes (Addendum)
 Cardiology Office Note:    Date:  06/04/2023   ID:  Charles Lloyd., DOB August 27, 1940, MRN [id number]  PCP:  Philip Aspen, Limmie Patricia, MD  Cardiologist:  Gypsy Balsam, MD    Referring MD: Philip Aspen, Estel*   Chief Complaint  Patient presents with   Medication Management    History of Present Illness:    Charles Lloyd. is a 83 y.o. male past medical history significant for paroxysmal atrial fibrillation, status post ablation done in 2015 2021 2022 now still continue rhythm control strategy with flecainide.  He is also anticoagulated Eliquis does have history of coronary artery disease however stable from that point review. Comes today to months for follow-up overall doing great.  He was put on amlodipine instead of hydralazine and that gave him significant swelling of lower extremities he changed back to hydralazine and thinks looking good.  No more swelling.  He walks around with a cane and that is because of chronic back problem and he is of no difficulty doing it.  Denies have any chest pain tightness squeezing pressure burning chest.  Overall happy and satisfied well he feels  Past Medical History:  Diagnosis Date   Acute tracheobronchitis 01/05/2019   B12 deficiency    Basal cell carcinoma (BCC) of left side of nose 01/28/2018   Cancer (HCC)    Cardiomyopathy (HCC) 03/10/2018   With chronic atrial fibrillation   CHF (congestive heart failure) (HCC) 04/16/2013   Functional class II, ejection fraction 35-40%  Formatting of this note might be different from the original. Functional class II, ejection fraction 35-40%   Chronic anticoagulation    Chronic diastolic (congestive) heart failure (HCC) 04/16/2013   Functional class II, ejection fraction 35-40%  Last Assessment & Plan:  Clinically stable Volume well controlled meds reviewed   Chronic diastolic CHF (congestive heart failure) (HCC) 04/16/2013   Functional class II, ejection fraction 35-40%  Last Assessment  & Plan:  Clinically stable Volume well controlled meds reviewed   Colon polyps 04/16/2013   Coronary artery disease involving native coronary artery of native heart without angina pectoris 04/05/2022   Myoview 05/25/2019: EF 60, normal perfusion, low risk CCTA (AF protocol) 04/25/20: CAC score 4242 (99th percentile)   Diabetic neuropathy (HCC)    Eosinophil count raised 02/17/2019   Essential hypertension 01/06/2010   Last Assessment & Plan:  Well controlled Continue med management   Gout    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  Steroid usage was discontinued several months ago.  Follow up with Dermatology as planned.   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   Last Assessment & Plan:  Id following ?ongoing abx managementy reported Request records   Infection of prosthetic right knee joint (HCC)    Insomnia 03/04/2012   Grief with loss of wife 02/20/12  Last Assessment & Plan:  Improved sx  contineu xanax prn Se discussed   Lesion of liver 04/20/2019   -On cardiac CT 02/2019   Mild reactive airways disease    Neoplasm of prostate 04/16/2013   Neoplasm of prostate, 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 Transthoracic Echocardiogram-03/10/2013-[facility]: Normal left ventricular wall thickness and cavity  size.  Global left ventricular systolic function is moderately reduced.  The estimated ejection fraction is 35-40%.  The left atrium is moderately enlarged.  The right atrium is mildly enlarged.  No significant valvular    Overweight (BMI 25.0-29.9) 10/22/2016   Persistent atrial fibrillation (HCC) 04/16/2013   Last Assessment & Plan:  Rate controlled Continue med management eliquis for stroke preventino  Formatting of this note might be different from the original.  Drug  HX Current Rx Pre-ABL inefficacy Pre-ABL intolerant Post-ABL inefficacy Post-ABL intolerant max dose/24h M/Y end comments  sotalol                  dofetilide                  flecainide                  propafenone                  am   S/P TKR (total knee replacement), bilateral    Secondary hypercoagulable state (HCC) 04/09/2019   Tinea cruris 04/16/2013   Type 2 diabetes mellitus with diabetic polyneuropathy, without long-term current use of insulin (HCC) 04/16/2013   Last Assessment & Plan:  Labs today Pt reports well controlled on ambulatory monitoring   Vitamin D deficiency 07/25/2018    Past Surgical History:  Procedure Laterality Date   ATRIAL FIBRILLATION ABLATION N/A 03/12/2019   Procedure: ATRIAL FIBRILLATION ABLATION;  Surgeon: Regan Lemming, MD;  Location: [facility] INVASIVE CV LAB;  Service: Cardiovascular;  Laterality: N/A;   ATRIAL FIBRILLATION ABLATION N/A 04/29/2020   Procedure: ATRIAL FIBRILLATION ABLATION;  Surgeon: Regan Lemming, MD;  Location: [facility]VAS[facility] CV LAB;  Service: Cardiovascular;  Laterality: N/A;   CARDIAC ELECTROPHYSIOLOGY STUDY AND ABLATION     CARDIOVERSION     CARDIOVERSION N/A 10/10/2018   Procedure: CARDIOVERSION;  Surgeon: Wendall Stade, MD;  Location: [facility] ENDOSCOPY;  Service: Cardiovascular;  Laterality: N/A;   CARDIOVERSION N/A 05/18/2020   Procedure: CARDIOVERSION;  Surgeon: Vesta Mixer, MD;  Location: [facility] ENDOSCOPY;  Service: Cardiovascular;  Laterality: N/A;    CARDIOVERSION N/A 12/09/2020   Procedure: CARDIOVERSION;  Surgeon: Lewayne Bunting, MD;  Location: [facility] ENDOSCOPY;  Service: Cardiovascular;  Laterality: N/A;   CHOLECYSTECTOMY     PROSTATECTOMY     REPLACEMENT TOTAL KNEE BILATERAL      Current Medications: Current Meds  Medication Sig   ACCU-CHEK AVIVA PLUS test strip 1 EACH BY OTHER ROUTE DAILY. DX E11.9 (Patient taking differently: 2 each by Other route daily. Dx E11.9)   albuterol (VENTOLIN HFA) 108 (90 Base) MCG/ACT inhaler Inhale 1-2 puffs into the lungs every 6 (six) hours as needed for wheezing or shortness of breath.   allopurinol (ZYLOPRIM) 300 MG tablet TAKE 1 TABLET BY MOUTH EVERY DAY   apixaban (ELIQUIS) 5 MG TABS tablet TAKE 1 TABLET TWICE A DAY (Patient taking differently: Take 5 mg by mouth 2 (two) times daily.)   carvedilol (COREG) 3.125 MG tablet TAKE 1 TABLET BY MOUTH TWICE A DAY   colchicine 0.6 MG tablet Take 1 tablet (0.6 mg total) by mouth  2 (two) times daily.   Continuous Blood Gluc Receiver (FREESTYLE LIBRE 2 READER) DEVI 1 each by Does not apply route daily.   Continuous Blood Gluc Sensor (FREESTYLE LIBRE 2 SENSOR) MISC 1 each by Does not apply route daily.   doxycycline (VIBRAMYCIN) 100 MG capsule Take 1 capsule (100 mg total) by mouth 2 (two) times daily.   flecainide (TAMBOCOR) 100 MG tablet TAKE 1 TABLET BY MOUTH TWICE A DAY   fluticasone (FLONASE) 50 MCG/ACT nasal spray Place 2 sprays into both nostrils daily.   fluticasone furoate-vilanterol (BREO ELLIPTA) 100-25 MCG/INH AEPB Inhale 1 puff into the lungs daily.   gabapentin (NEURONTIN) 300 MG capsule Take one tablet in the morning, one tablet in the afternoon, and 2 tablets at bedtime   hydrALAZINE (APRESOLINE) 25 MG tablet Take 1 tablet (25 mg total) by mouth 4 (four) times daily.   insulin glargine (LANTUS SOLOSTAR) 100 UNIT/ML Solostar Pen Inject 8 Units into the skin daily.   irbesartan (AVAPRO) 150 MG tablet Take 1 tablet (150 mg total) by mouth daily.    KLOR-CON M20 20 MEQ tablet TAKE 1 TABLET BY MOUTH EVERY DAY   montelukast (SINGULAIR) 10 MG tablet Take 10 mg by mouth at bedtime.    RESTASIS 0.05 % ophthalmic emulsion Place 1 drop into both eyes 2 (two) times daily as needed (dry eyes).   torsemide (DEMADEX) 20 MG tablet Take 1 tablet (20 mg total) by mouth 2 (two) times daily.   [DISCONTINUED] atorvastatin (LIPITOR) 20 MG tablet TAKE 1 TABLET DAILY (PLEASE SCHEDULE A PHYSICAL FOR MORE REFILLS) (Patient taking differently: Take 20 mg by mouth daily.)     Allergies:   Amlodipine   Social History   Socioeconomic History   Marital status: Widowed    Spouse name: Not on file   Number of children: Not on file   Years of education: Not on file   Highest education level: Not on file  Occupational History   Not on file  Tobacco Use   Smoking status: Never    Passive exposure: Never   Smokeless tobacco: Never  Vaping Use   Vaping status: Never Used  Substance and Sexual Activity   Alcohol use: Yes    Alcohol/week: 14.0 standard drinks of alcohol    Types: 14 Glasses of wine per week   Drug use: Never   Sexual activity: Not Currently  Other Topics Concern   Not on file  Social History Narrative   Not on file   Social Drivers of Health   Financial Resource Strain: Low Risk  (12/21/2022)   Overall Financial Resource Strain (CARDIA)    Difficulty of Paying Living Expenses: Not hard at all  Food Insecurity: No Food Insecurity (12/21/2022)   Hunger Vital Sign    Worried About Running Out of Food in the Last Year: Never true    Ran Out of Food in the Last Year: Never true  Transportation Needs: No Transportation Needs (12/21/2022)   PRAPARE - Administrator, Civil Service (Medical): No    Lack of Transportation (Non-Medical): No  Physical Activity: Inactive (12/21/2022)   Exercise Vital Sign    Days of Exercise per Week: 0 days    Minutes of Exercise per Session: 0 min  Stress: No Stress Concern Present (12/21/2022)    [employer] of Occupational Health - Occupational Stress Questionnaire    Feeling of Stress : Not at all  Social Connections: Socially Isolated (12/21/2022)   Social Connection and Isolation  Panel [NHANES]    Frequency of Communication with Friends and Family: More than three times a week    Frequency of Social Gatherings with Friends and Family: More than three times a week    Attends Religious Services: Never    Database administrator or Organizations: No    Attends Banker Meetings: Never    Marital Status: Widowed     Family History: The patient's family history includes Cancer in his father and mother; Depression in his father and mother; Early death in his father and mother. ROS:   Please see the history of present illness.    All 14 point review of systems negative except as described per history of present illness  EKGs/Labs/Other Studies Reviewed:    EKG Interpretation Date/Time:  Tuesday June 04 2023 13:02:13 EDT Ventricular Rate:  84 PR Interval:  270 QRS Duration:  130 QT Interval:  422 QTC Calculation: 498 R Axis:   84  Text Interpretation: Sinus rhythm with 1st degree A-V block Non-specific intra-ventricular conduction block When compared with ECG of 10-Dec-2022 15:53, ST now depressed in Lateral leads Confirmed by Gypsy Balsam [phone]) on 06/04/2023 1:12:59 PM    Recent Labs: 07/12/2022: TSH 2.11 07/17/2022: ALT 43 10/19/2022: BUN 25; Creatinine, Ser 1.15; Hemoglobin 11.9; Platelets 209; Potassium 3.7; Sodium 138  Recent Lipid Panel    Component Value Date/Time   CHOL 120 05/20/2020 0914   TRIG 73 05/20/2020 0914   HDL 46 05/20/2020 0914   CHOLHDL 2.6 05/20/2020 0914   CHOLHDL 3 07/24/2018 1058   VLDL 25.0 07/24/2018 1058   LDLCALC 59 05/20/2020 0914    Physical Exam:    VS:  BP 136/84 (BP Location: Right Arm, Patient Position: Sitting)   Pulse 89   Ht 5\' 10"  (1.778 m)   Wt 177 lb (80.3 kg)   SpO2 97%   BMI 25.40 kg/m      Wt Readings from Last 3 Encounters:  06/04/23 177 lb (80.3 kg)  12/21/22 182 lb 3.2 oz (82.6 kg)  12/10/22 176 lb (79.8 kg)     GEN:  Well nourished, well developed in no acute distress HEENT: Normal NECK: No JVD; No carotid bruits LYMPHATICS: No lymphadenopathy CARDIAC: RRR, no murmurs, no rubs, no gallops RESPIRATORY:  Clear to auscultation without rales, wheezing or rhonchi  ABDOMEN: Soft, non-tender, non-distended MUSCULOSKELETAL:  No edema; No deformity  SKIN: Warm and dry LOWER EXTREMITIES: no swelling NEUROLOGIC:  Alert and oriented x 3 PSYCHIATRIC:  Normal affect   ASSESSMENT:    1. Persistent atrial fibrillation (HCC)   2. Chronic heart failure with preserved ejection fraction (HFpEF) (HCC)   3. Coronary artery disease involving native coronary artery of native heart without angina pectoris   4. Essential hypertension   5. Paroxysmal A-fib (HCC)    PLAN:    In order of problems listed above:  Paroxysmal atrial fibrillation, maintained sinus rhythm, on flecainide continue anticoagulation he takes 5 mg of Eliquis dose is appropriate to his weight kidney function and weight. Dyslipidemia I did review K PN which show me data from 2022 with LDL 59 HDL 46, I wanted to do the test but he said he is scheduled to see his primary care physician in the next few weeks and I will do the test in the meantime we will continue Lipitor 20. Essential hypertension blood pressure well-controlled continue present management High burden of PVCs, will repeat echocardiogram in the summer   Medication Adjustments/Labs and Tests Ordered: Current  medicines are reviewed at length with the patient today.  Concerns regarding medicines are outlined above.  Orders Placed This Encounter  Procedures   EKG 12-Lead   Medication changes: No orders of the defined types were placed in this encounter.   Signed, Georgeanna Lea, MD, [facility] 06/04/2023 1:21 PM    [facility]  [facility]

## 2023-06-04 NOTE — Telephone Encounter (Signed)
 Copied from CRM [id number]. Topic: Clinical - Medication Refill >> Jun 04, 2023  8:39 AM Myrtice Lauth wrote: Most Recent Primary Care Visit:  Provider: Tillie Rung  Department: [facility]  Visit Type: MEDICARE AWV, SEQUENTIAL  Date: 12/21/2022  Medication: atorvastatin (LIPITOR) 20 MG tablet  Has the patient contacted their pharmacy? Yes (Agent: If no, request that the patient contact the pharmacy for the refill. If patient does not wish to contact the pharmacy document the reason why and proceed with request.) (Agent: If yes, when and what did the pharmacy advise?)  Is this the correct pharmacy for this prescription? Yes If no, delete pharmacy and type the correct one.  This is the patient's preferred pharmacy:  [id number] [place], [place] - [address] [place] [address] Phone: [phone] Fax: [phone]  [facility] - [place], [place] - [address] [place] [address] Phone: [phone] Fax: [phone]  [facility]ENTER [facility] [address] [place] [address] Phone: [phone] Fax: [phone]   Has the prescription been filled recently? Yes  Is the patient out of the medication? Yes  Has the patient been seen for an appointment in the last year OR does the patient have an upcoming appointment? Yes  Can we respond through MyChart? Yes  Agent: Please be advised that Rx refills may take up to 3 business days. We ask that you follow-up with your pharmacy.

## 2023-06-04 NOTE — Patient Instructions (Signed)
 Medication Instructions:  Your physician recommends that you continue on your current medications as directed. Please refer to the Current Medication list given to you today.  *If you need a refill on your cardiac medications before your next appointment, please call your pharmacy*   Lab Work: None Ordered If you have labs (blood work) drawn today and your tests are completely normal, you will receive your results only by: MyChart Message (if you have MyChart) OR A paper copy in the mail If you have any lab test that is abnormal or we need to change your treatment, we will call you to review the results.   Testing/Procedures: Your physician has requested that you have an echocardiogram. Echocardiography is a painless test that uses sound waves to create images of your heart. It provides your doctor with information about the size and shape of your heart and how well your heart's chambers and valves are working. This procedure takes approximately one hour. There are no restrictions for this procedure. Please do NOT wear cologne, perfume, aftershave, or lotions (deodorant is allowed). Please arrive 15 minutes prior to your appointment time.  Please note: We ask at that you not bring children with you during ultrasound (echo/ vascular) testing. Due to room size and safety concerns, children are not allowed in the ultrasound rooms during exams. Our front office staff cannot provide observation of children in our lobby area while testing is being conducted. An adult accompanying a patient to their appointment will only be allowed in the ultrasound room at the discretion of the ultrasound technician under special circumstances. We apologize for any inconvenience.    Follow-Up: At [facility], you and your health needs are our priority.  As part of our continuing mission to provide you with exceptional heart care, we have created designated Provider Care Teams.  These Care Teams include your  primary Cardiologist (physician) and Advanced Practice Providers (APPs -  Physician Assistants and Nurse Practitioners) who all work together to provide you with the care you need, when you need it.  We recommend signing up for the patient portal called "MyChart".  Sign up information is provided on this After Visit Summary.  MyChart is used to connect with patients for Virtual Visits (Telemedicine).  Patients are able to view lab/test results, encounter notes, upcoming appointments, etc.  Non-urgent messages can be sent to your provider as well.   To learn more about what you can do with MyChart, go to [email or web address].    Your next appointment:   12 month(s)  The format for your next appointment:   In Person  Provider:   Gypsy Balsam, MD    Other Instructions NA

## 2023-06-04 NOTE — Addendum Note (Signed)
 Addended by: Baldo Ash D on: 06/04/2023 01:36 PM   Modules accepted: Orders

## 2023-06-27 LAB — HM DIABETES EYE EXAM

## 2023-07-09 ENCOUNTER — Other Ambulatory Visit: Payer: Self-pay | Admitting: Internal Medicine

## 2023-07-18 ENCOUNTER — Other Ambulatory Visit: Payer: Self-pay | Admitting: Internal Medicine

## 2023-07-18 DIAGNOSIS — M1A0411 Idiopathic chronic gout, right hand, with tophus (tophi): Secondary | ICD-10-CM

## 2023-07-21 ENCOUNTER — Other Ambulatory Visit: Payer: Self-pay | Admitting: Cardiology

## 2023-07-26 ENCOUNTER — Encounter (HOSPITAL_BASED_OUTPATIENT_CLINIC_OR_DEPARTMENT_OTHER): Payer: Self-pay

## 2023-07-26 ENCOUNTER — Other Ambulatory Visit: Payer: Self-pay

## 2023-07-26 ENCOUNTER — Emergency Department (HOSPITAL_BASED_OUTPATIENT_CLINIC_OR_DEPARTMENT_OTHER)
Admission: EM | Admit: 2023-07-26 | Discharge: 2023-07-26 | Disposition: A | Discharge status: Home / Self Care | Attending: Emergency Medicine | Admitting: Emergency Medicine

## 2023-07-26 DIAGNOSIS — Y9301 Activity, walking, marching and hiking: Secondary | ICD-10-CM | POA: Diagnosis not present

## 2023-07-26 DIAGNOSIS — W19XXXA Unspecified fall, initial encounter: Secondary | ICD-10-CM | POA: Diagnosis not present

## 2023-07-26 DIAGNOSIS — Z7901 Long term (current) use of anticoagulants: Secondary | ICD-10-CM | POA: Insufficient documentation

## 2023-07-26 DIAGNOSIS — Z794 Long term (current) use of insulin: Secondary | ICD-10-CM | POA: Insufficient documentation

## 2023-07-26 DIAGNOSIS — S51811A Laceration without foreign body of right forearm, initial encounter: Secondary | ICD-10-CM | POA: Insufficient documentation

## 2023-07-26 DIAGNOSIS — S8002XA Contusion of left knee, initial encounter: Secondary | ICD-10-CM | POA: Diagnosis not present

## 2023-07-26 DIAGNOSIS — S59911A Unspecified injury of right forearm, initial encounter: Secondary | ICD-10-CM | POA: Diagnosis present

## 2023-07-26 DIAGNOSIS — T07XXXA Unspecified multiple injuries, initial encounter: Secondary | ICD-10-CM

## 2023-07-26 LAB — BASIC METABOLIC PANEL WITH GFR
Anion gap: 13 (ref 5–15)
BUN: 14 mg/dL (ref 8–23)
CO2: 24 mmol/L (ref 22–32)
Calcium: 9.1 mg/dL (ref 8.9–10.3)
Chloride: 94 mmol/L — ABNORMAL LOW (ref 98–111)
Creatinine, Ser: 1.21 mg/dL (ref 0.61–1.24)
GFR, Estimated: >60 mL/min (ref >60)
Glucose, Bld: 119 mg/dL — ABNORMAL HIGH (ref 70–99)
Potassium: 3.4 mmol/L — ABNORMAL LOW (ref 3.5–5.1)
Sodium: 131 mmol/L — ABNORMAL LOW (ref 135–145)

## 2023-07-26 NOTE — Discharge Instructions (Signed)
 Your electrolytes today did not show any significant abnormality.  Follow-up with your doctor as needed

## 2023-07-26 NOTE — ED Provider Notes (Signed)
 [place] EMERGENCY DEPARTMENT AT [facility] Provider Note   CSN: [id number] Arrival date & time: 07/26/23  1103     History  Chief Complaint  Patient presents with   Charles Lloyd. is a 83 y.o. male.  83 year old male who presents with abrasions to his right arm after mechanical fall prior to arrival.  Patient states that he was walking and had a sudden sharp pain to his legs which caused him to fall to his knees.  Did not strike his head did not lose consciousness.  He is on Eliquis .  States that the pain was only for a few seconds now he is able to walk his baseline.  He does use a rollator occasionally.  Complains of slight left knee discomfort without decreased range of motion.  Denies any hip pain.  He denies any new medications.  No recent vomiting or diarrhea.       Home Medications Prior to Admission medications   Medication Sig Start Date End Date Taking? Authorizing Provider  ACCU-CHEK AVIVA PLUS test strip 1 EACH BY OTHER ROUTE DAILY. DX E11.9 Patient taking differently: 2 each by Other route daily. Dx E11.9 08/28/21   Zilphia Hilt, Charyl Coppersmith, MD  albuterol  (VENTOLIN  HFA) 108 [phone] Base) MCG/ACT inhaler Inhale 1-2 puffs into the lungs every 6 (six) hours as needed for wheezing or shortness of breath. 10/05/20   Parrett, Macdonald Savoy, NP  allopurinol  (ZYLOPRIM ) 300 MG tablet TAKE 1 TABLET BY MOUTH EVERY DAY 07/18/23   Zilphia Hilt, Charyl Coppersmith, MD  apixaban  (ELIQUIS ) 5 MG TABS tablet TAKE 1 TABLET TWICE A DAY Patient taking differently: Take 5 mg by mouth 2 (two) times daily. 11/08/22   Zilphia Hilt, Charyl Coppersmith, MD  atorvastatin  (LIPITOR) 20 MG tablet Take 1 tablet (20 mg total) by mouth daily. TAKE 1 TABLET DAILY (PLEASE SCHEDULE A PHYSICAL FOR MORE REFILLS) 06/04/23   Zilphia Hilt, Charyl Coppersmith, MD  carvedilol  (COREG ) 3.125 MG tablet TAKE 1 TABLET BY MOUTH TWICE A DAY 07/09/23   Zilphia Hilt, Charyl Coppersmith, MD  colchicine  0.6 MG tablet Take 1 tablet (0.6  mg total) by mouth 2 (two) times daily. 01/08/22   Zilphia Hilt, Charyl Coppersmith, MD  Continuous Blood Gluc Receiver (FREESTYLE LIBRE 2 READER) DEVI 1 each by Does not apply route daily. 08/28/21   Zilphia Hilt, Charyl Coppersmith, MD  Continuous Blood Gluc Sensor (FREESTYLE LIBRE 2 SENSOR) MISC 1 each by Does not apply route daily. 08/28/21   Zilphia Hilt, Charyl Coppersmith, MD  doxycycline  (VIBRAMYCIN ) 100 MG capsule Take 1 capsule (100 mg total) by mouth 2 (two) times daily. 08/22/22   Burchette, Marijean Shouts, MD  flecainide  (TAMBOCOR ) 100 MG tablet TAKE 1 TABLET BY MOUTH TWICE A DAY 01/15/23   Camnitz, Babetta Lesch, MD  fluticasone  (FLONASE ) 50 MCG/ACT nasal spray Place 2 sprays into both nostrils daily. 01/07/19   Doroteo Gasmen, MD  fluticasone  furoate-vilanterol (BREO ELLIPTA ) 100-25 MCG/INH AEPB Inhale 1 puff into the lungs daily. 10/05/20   Parrett, Macdonald Savoy, NP  gabapentin  (NEURONTIN ) 300 MG capsule Take one tablet in the morning, one tablet in the afternoon, and 2 tablets at bedtime 04/22/23   Zilphia Hilt, Charyl Coppersmith, MD  hydrALAZINE  (APRESOLINE ) 25 MG tablet TAKE 1 TABLET (25 MG TOTAL) BY MOUTH 4 (FOUR) TIMES DAILY. 07/23/23   Krasowski, Robert J, MD  insulin  glargine (LANTUS  SOLOSTAR) 100 UNIT/ML Solostar Pen Inject 8 Units into the skin daily. 09/29/21   Burchette,  Marijean Shouts, MD  irbesartan  (AVAPRO ) 150 MG tablet Take 1 tablet (150 mg total) by mouth daily. 12/10/22   Lei Pump, MD  KLOR-CON  M20 20 MEQ tablet TAKE 1 TABLET BY MOUTH EVERY DAY 11/06/22   Zilphia Hilt, Charyl Coppersmith, MD  montelukast  (SINGULAIR ) 10 MG tablet Take 10 mg by mouth at bedtime.     [provider]  RESTASIS  0.05 % ophthalmic emulsion Place 1 drop into both eyes 2 (two) times daily as needed (dry eyes). 12/09/19   [provider]  torsemide  (DEMADEX ) 20 MG tablet Take 1 tablet (20 mg total) by mouth 2 (two) times daily. 05/03/23   Krasowski, Robert J, MD      Allergies    Amlodipine     Review of Systems    Review of Systems  All other systems reviewed and are negative.   Physical Exam Updated Vital Signs BP (!) 150/106 (BP Location: Right Arm)   Pulse 65   Temp 98.9 F (37.2 C) (Oral)   Resp 20   Ht 1.778 m (5\' 10" )   Wt 80.3 kg   SpO2 99%   BMI 25.40 kg/m  Physical Exam Vitals and nursing note reviewed.  Constitutional:      General: He is not in acute distress.    Appearance: Normal appearance. He is well-developed. He is not toxic-appearing.  HENT:     Head: Normocephalic and atraumatic.     Comments: No evidence of head trauma.  Nontender along cervical spine Eyes:     General: Lids are normal.     Conjunctiva/sclera: Conjunctivae normal.     Pupils: Pupils are equal, round, and reactive to light.  Neck:     Thyroid : No thyroid  mass.     Trachea: No tracheal deviation.  Cardiovascular:     Rate and Rhythm: Normal rate and regular rhythm.     Heart sounds: Normal heart sounds. No murmur heard.    No gallop.  Pulmonary:     Effort: Pulmonary effort is normal. No respiratory distress.     Breath sounds: Normal breath sounds. No stridor. No decreased breath sounds, wheezing, rhonchi or rales.  Abdominal:     General: There is no distension.     Palpations: Abdomen is soft.     Tenderness: There is no abdominal tenderness. There is no rebound.  Musculoskeletal:        General: No tenderness. Normal range of motion.     Cervical back: Normal range of motion and neck supple.       Legs:     Comments: Skin tear noted to right lateral forearm proximal  Skin:    General: Skin is warm and dry.     Findings: No abrasion or rash.  Neurological:     General: No focal deficit present.     Mental Status: He is alert and oriented to person, place, and time. Mental status is at baseline.     GCS: GCS eye subscore is 4. GCS verbal subscore is 5. GCS motor subscore is 6.     Cranial Nerves: No cranial nerve deficit.     Sensory: No sensory deficit.     Motor: Motor function  is intact.     Gait: Gait is intact. Gait normal.  Psychiatric:        Attention and Perception: Attention normal.        Speech: Speech normal.        Behavior: Behavior normal.     ED  Results / Procedures / Treatments   Labs (all labs ordered are listed, but only abnormal results are displayed) Labs Reviewed - No data to display  EKG None  Radiology No results found.  Procedures Procedures    Medications Ordered in ED Medications - No data to display  ED Course/ Medical Decision Making/ A&P                                 Medical Decision Making Amount and/or Complexity of Data Reviewed Labs: ordered.   Patient's electrolytes are without significant abnormality.  Do not feel that patient needs any imaging at this time.  Have been dressed by nursing staff.  Will discharge patient home        Final Clinical Impression(s) / ED Diagnoses Final diagnoses:  None    Rx / DC Orders ED Discharge Orders     None         Lind Repine, MD 07/26/23 1222

## 2023-07-26 NOTE — ED Triage Notes (Addendum)
 Patient arrives POV with complaints of having a mechanical fall today. Patient fell in his home on carpet. Patient did not hit his head. states that he had sharp pain in his back that caused him to fall. Did not hit his head. No LOC Bruising to his left knee. Abrasions/skin tears to both arms.   On eliquis 

## 2023-07-31 ENCOUNTER — Other Ambulatory Visit (HOSPITAL_BASED_OUTPATIENT_CLINIC_OR_DEPARTMENT_OTHER): Payer: Self-pay

## 2023-07-31 ENCOUNTER — Encounter: Payer: Self-pay | Admitting: Internal Medicine

## 2023-07-31 ENCOUNTER — Ambulatory Visit: Admitting: Internal Medicine

## 2023-07-31 VITALS — BP 120/60 | HR 75 | Temp 98.2°F | Wt 185.2 lb

## 2023-07-31 DIAGNOSIS — M25562 Pain in left knee: Secondary | ICD-10-CM

## 2023-07-31 DIAGNOSIS — Z09 Encounter for follow-up examination after completed treatment for conditions other than malignant neoplasm: Secondary | ICD-10-CM | POA: Diagnosis not present

## 2023-07-31 DIAGNOSIS — E1142 Type 2 diabetes mellitus with diabetic polyneuropathy: Secondary | ICD-10-CM

## 2023-07-31 LAB — POCT GLYCOSYLATED HEMOGLOBIN (HGB A1C): Hemoglobin A1C: 6 % — AB (ref 4.0–5.6)

## 2023-07-31 MED ORDER — APIXABAN 5 MG PO TABS
5.0000 mg | ORAL_TABLET | Freq: Two times a day (BID) | ORAL | 1 refills | Status: DC
  Filled 2023-07-31: qty 180, 90d supply, fill #0
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
## 2023-08-01 ENCOUNTER — Other Ambulatory Visit: Payer: Self-pay

## 2023-08-01 ENCOUNTER — Other Ambulatory Visit (HOSPITAL_BASED_OUTPATIENT_CLINIC_OR_DEPARTMENT_OTHER): Payer: Self-pay

## 2023-08-01 ENCOUNTER — Ambulatory Visit: Payer: Self-pay

## 2023-08-01 MED ORDER — APIXABAN 5 MG PO TABS: 5.0000 mg | ORAL_TABLET | Freq: Two times a day (BID) | ORAL | 1 refills | Status: DC

## 2023-08-01 MED ORDER — MELOXICAM 7.5 MG PO TABS: 7.5000 mg | ORAL_TABLET | Freq: Every day | ORAL | 0 refills | Status: DC
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
## 2023-08-01 NOTE — Telephone Encounter (Signed)
 Rx did not go through electronically.  Rx's called in.  Patient is aware.

## 2023-08-01 NOTE — Telephone Encounter (Signed)
 Patient had also inquired about his prescription for Eloquis during triage with this RN earlier.  Attempted to call patient back to verify the pharmacy that he wanted it sent to and unable to reach patient at this time.

## 2023-08-02 ENCOUNTER — Telehealth: Payer: Self-pay

## 2023-08-02 MED ORDER — MELOXICAM 7.5 MG PO TABS: 7.5000 mg | ORAL_TABLET | Freq: Every day | ORAL | 0 refills | Status: DC

## 2023-08-02 MED ORDER — APIXABAN 5 MG PO TABS: 5.0000 mg | ORAL_TABLET | Freq: Two times a day (BID) | ORAL | 1 refills | Status: DC

## 2023-08-02 NOTE — Telephone Encounter (Signed)
 Copied from CRM [id number]. Topic: Clinical - Prescription Issue >> Aug 02, 2023  9:35 AM Adonis Hoot wrote: Reason for CRM: Patient called in stating that [facility] said that they do not have prescriptions for  meloxicam (MOBIC) 7.5 MG tabletapixaban (ELIQUIS ) 5 MG TABS tablet That was sent over for patient on 08/01/2023. Could prescriptions be resent please.

## 2023-08-13 ENCOUNTER — Other Ambulatory Visit: Payer: Self-pay | Admitting: Internal Medicine

## 2023-08-14 ENCOUNTER — Telehealth: Payer: Self-pay

## 2023-08-14 NOTE — Telephone Encounter (Signed)
   Pre-operative Risk Assessment    Patient Name: Charles Lloyd.  DOB: 1940-05-27 MRN: [id number]   Date of last office visit: 06/04/23 Date of next office visit: N/A   Request for Surgical Clearance    Procedure:  lumbar epidural  Date of Surgery:  Clearance TBD                                Surgeon:  Dr. Lesa Rape Surgeon's Group or Practice Name:  [facility] Phone number:  [phone] Fax number:  [phone]   Type of Clearance Requested:   - Pharmacy:  Hold Apixaban  (Eliquis ) 3 days prior and can resume day after   Type of Anesthesia:  None    Additional requests/questions:    Gib Kurk   08/14/2023, 6:42 AM

## 2023-08-15 NOTE — Telephone Encounter (Addendum)
     Primary Cardiologist: Zoe Hinds, MD  Clinical pharmacist have reviewed patient's chart and provided the following recommendations for , Charles Lloyd.:  Procedure:  lumbar epidural   Date of Surgery:  Clearance TBD     CHA2DS2-VASc Score = 6   This indicates a 9.7% annual risk of stroke. The patient's score is based upon: CHF History: 1 HTN History: 1 Diabetes History: 1 Stroke History: 0 Vascular Disease History: 1 Age Score: 2 Gender Score: 0     CrCl 58 Platelet count 209   Patient has not had an Afib/aflutter ablation within the last 3 months or DCCV within the last 30 days   Per office protocol, patient can hold Eliquis  for 3 days prior to procedure.   Patient will not need bridging with Lovenox (enoxaparin) around procedure.  I will route this recommendation to the requesting party via Epic fax function and remove from pre-op pool.  Please call with questions.  Chet Cota. Katalaya Beel NP-C     08/15/2023, 4:28 PM [facility] [address] Office [phone] Fax [phone]

## 2023-08-15 NOTE — Telephone Encounter (Signed)
 Patient with diagnosis of atrial fibrillation on Eliquis  for anticoagulation.    Procedure:  lumbar epidural   Date of Surgery:  Clearance TBD   CHA2DS2-VASc Score = 6   This indicates a 9.7% annual risk of stroke. The patient's score is based upon: CHF History: 1 HTN History: 1 Diabetes History: 1 Stroke History: 0 Vascular Disease History: 1 Age Score: 2 Gender Score: 0    CrCl 58 Platelet count 209  Patient has not had an Afib/aflutter ablation within the last 3 months or DCCV within the last 30 days  Per office protocol, patient can hold Eliquis  for 3 days prior to procedure.   Patient will not need bridging with Lovenox (enoxaparin) around procedure.  **This guidance is not considered finalized until pre-operative APP has relayed final recommendations.**

## 2023-08-23 ENCOUNTER — Emergency Department (HOSPITAL_BASED_OUTPATIENT_CLINIC_OR_DEPARTMENT_OTHER): Admitting: Radiology

## 2023-08-23 ENCOUNTER — Other Ambulatory Visit (HOSPITAL_BASED_OUTPATIENT_CLINIC_OR_DEPARTMENT_OTHER): Payer: Self-pay

## 2023-08-23 ENCOUNTER — Other Ambulatory Visit: Payer: Self-pay

## 2023-08-23 ENCOUNTER — Emergency Department (HOSPITAL_BASED_OUTPATIENT_CLINIC_OR_DEPARTMENT_OTHER)
Admission: EM | Admit: 2023-08-23 | Discharge: 2023-08-23 | Disposition: A | Discharge status: Home / Self Care | Attending: Emergency Medicine | Admitting: Emergency Medicine

## 2023-08-23 DIAGNOSIS — I509 Heart failure, unspecified: Secondary | ICD-10-CM | POA: Diagnosis not present

## 2023-08-23 DIAGNOSIS — T8130XA Disruption of wound, unspecified, initial encounter: Secondary | ICD-10-CM | POA: Insufficient documentation

## 2023-08-23 DIAGNOSIS — Z7901 Long term (current) use of anticoagulants: Secondary | ICD-10-CM | POA: Diagnosis not present

## 2023-08-23 DIAGNOSIS — K649 Unspecified hemorrhoids: Secondary | ICD-10-CM | POA: Diagnosis present

## 2023-08-23 DIAGNOSIS — I2510 Atherosclerotic heart disease of native coronary artery without angina pectoris: Secondary | ICD-10-CM | POA: Diagnosis not present

## 2023-08-23 DIAGNOSIS — I130 Hypertensive heart and chronic kidney disease with heart failure and stage 1 through stage 4 chronic kidney disease, or unspecified chronic kidney disease: Secondary | ICD-10-CM | POA: Insufficient documentation

## 2023-08-23 DIAGNOSIS — Z794 Long term (current) use of insulin: Secondary | ICD-10-CM | POA: Diagnosis not present

## 2023-08-23 DIAGNOSIS — E1122 Type 2 diabetes mellitus with diabetic chronic kidney disease: Secondary | ICD-10-CM | POA: Insufficient documentation

## 2023-08-23 DIAGNOSIS — N189 Chronic kidney disease, unspecified: Secondary | ICD-10-CM | POA: Insufficient documentation

## 2023-08-23 DIAGNOSIS — Z79899 Other long term (current) drug therapy: Secondary | ICD-10-CM | POA: Diagnosis not present

## 2023-08-23 DIAGNOSIS — T148XXD Other injury of unspecified body region, subsequent encounter: Secondary | ICD-10-CM

## 2023-08-23 LAB — HEMOGLOBIN AND HEMATOCRIT, BLOOD
HCT: 32.7 % — ABNORMAL LOW (ref 39.0–52.0)
Hemoglobin: 10.7 g/dL — ABNORMAL LOW (ref 13.0–17.0)

## 2023-08-23 MED ORDER — CEPHALEXIN 250 MG PO CAPS
500.0000 mg | ORAL_CAPSULE | Freq: Once | ORAL | Status: AC
  Administered 2023-08-23: 500 mg via ORAL
  Filled 2023-08-23: qty 2

## 2023-08-23 MED ORDER — CEPHALEXIN 500 MG PO CAPS
500.0000 mg | ORAL_CAPSULE | Freq: Three times a day (TID) | ORAL | 0 refills | Status: DC
  Filled 2023-08-23: qty 14, 5d supply, fill #0

## 2023-08-23 NOTE — ED Triage Notes (Signed)
 Pt caox4, ambulatory with cane, pt reports his knee has been bleeding for approx 3 wks. Denies injury. Takes Eliquis .

## 2023-08-23 NOTE — Discharge Instructions (Addendum)
 It was a pleasure caring for you today.  Please follow-up with your primary care provider in the next 48-72 hours. Keep dressing applied for 24 hours. Seek emergency care if experiencing any new or worsening symptoms.

## 2023-08-23 NOTE — ED Provider Notes (Signed)
 [place] EMERGENCY DEPARTMENT AT [facility] Provider Note   CSN: [id number] Arrival date & time: 08/23/23  9080     Patient presents with: Hemorrhage   Charles Lloyd. is a 83 y.o. male with PMHx paroxysmal afib, HTN, HLD, DM, CHF, CKD, CAD, who presents to ED concerned for bleeding wound that has been present since mechanical fall 3 weeks ago. Patient is on blood thinners. Patient endorses constant blood oozing from wound since the incident. Patient denies any other acute symptoms today such as fever, nausea, vomiting.     HPI     Prior to Admission medications   Medication Sig Start Date End Date Taking? Authorizing Provider  cephALEXin  (KEFLEX ) 500 MG capsule Take 1 capsule (500 mg total) by mouth 3 (three) times daily. 08/23/23  Yes Zula Hovsepian, Nidia F, PA-C  ACCU-CHEK AVIVA PLUS test strip 1 EACH BY OTHER ROUTE DAILY. DX E11.9 Patient taking differently: 2 each by Other route daily. Dx E11.9 08/28/21   Theophilus Andrews, Tully GRADE, MD  albuterol  (VENTOLIN  HFA) 108 [phone] Base) MCG/ACT inhaler Inhale 1-2 puffs into the lungs every 6 (six) hours as needed for wheezing or shortness of breath. 10/05/20   Parrett, Madelin GORMAN, NP  allopurinol  (ZYLOPRIM ) 300 MG tablet TAKE 1 TABLET BY MOUTH EVERY DAY 07/18/23   Theophilus Andrews, Tully GRADE, MD  apixaban  (ELIQUIS ) 5 MG TABS tablet Take 1 tablet (5 mg total) by mouth 2 (two) times daily. 08/02/23   Theophilus Andrews, Tully GRADE, MD  atorvastatin  (LIPITOR) 20 MG tablet Take 1 tablet (20 mg total) by mouth daily. TAKE 1 TABLET DAILY (PLEASE SCHEDULE A PHYSICAL FOR MORE REFILLS) 06/04/23   Theophilus Andrews, Tully GRADE, MD  carvedilol  (COREG ) 3.125 MG tablet TAKE 1 TABLET BY MOUTH TWICE A DAY 07/09/23   Theophilus Andrews, Tully GRADE, MD  colchicine  0.6 MG tablet Take 1 tablet (0.6 mg total) by mouth 2 (two) times daily. 01/08/22   Theophilus Andrews, Tully GRADE, MD  Continuous Blood Gluc Receiver (FREESTYLE LIBRE 2 READER) DEVI 1 each by Does not apply route  daily. 08/28/21   Theophilus Andrews, Tully GRADE, MD  Continuous Blood Gluc Sensor (FREESTYLE LIBRE 2 SENSOR) MISC 1 each by Does not apply route daily. 08/28/21   Theophilus Andrews, Tully GRADE, MD  doxycycline  (VIBRAMYCIN ) 100 MG capsule Take 1 capsule (100 mg total) by mouth 2 (two) times daily. 08/22/22   Burchette, Wolm ORN, MD  flecainide  (TAMBOCOR ) 100 MG tablet TAKE 1 TABLET BY MOUTH TWICE A DAY 01/15/23   Camnitz, Soyla Lunger, MD  fluticasone  (FLONASE ) 50 MCG/ACT nasal spray Place 2 sprays into both nostrils daily. 01/07/19   Rosario Leatrice FERNS, MD  fluticasone  furoate-vilanterol (BREO ELLIPTA ) 100-25 MCG/INH AEPB Inhale 1 puff into the lungs daily. 10/05/20   Parrett, Madelin GORMAN, NP  gabapentin  (NEURONTIN ) 300 MG capsule Take one tablet in the morning, one tablet in the afternoon, and 2 tablets at bedtime 04/22/23   Theophilus Andrews, Tully GRADE, MD  hydrALAZINE  (APRESOLINE ) 25 MG tablet TAKE 1 TABLET (25 MG TOTAL) BY MOUTH 4 (FOUR) TIMES DAILY. 07/23/23   Krasowski, Robert J, MD  insulin  glargine (LANTUS  SOLOSTAR) 100 UNIT/ML Solostar Pen Inject 8 Units into the skin daily. 09/29/21   Burchette, Wolm ORN, MD  irbesartan  (AVAPRO ) 150 MG tablet Take 1 tablet (150 mg total) by mouth daily. 12/10/22   Inocencio Soyla Lunger, MD  KLOR-CON  M20 20 MEQ tablet TAKE 1 TABLET BY MOUTH EVERY DAY 11/06/22   Theophilus Andrews, Estela  Y, MD  meloxicam  (MOBIC ) 7.5 MG tablet TAKE 1 TABLET BY MOUTH EVERY DAY 08/13/23   Theophilus Andrews, Tully GRADE, MD  montelukast  (SINGULAIR ) 10 MG tablet Take 10 mg by mouth at bedtime.     [provider]  RESTASIS  0.05 % ophthalmic emulsion Place 1 drop into both eyes 2 (two) times daily as needed (dry eyes). 12/09/19   [provider]  torsemide  (DEMADEX ) 20 MG tablet Take 1 tablet (20 mg total) by mouth 2 (two) times daily. 05/03/23   Krasowski, Robert J, MD    Allergies: Amlodipine     Review of Systems  Skin:  Positive for wound.    Updated Vital Signs BP (!) 149/80 (BP  Location: Right Arm)   Pulse 60   Temp 99.8 F (37.7 C) (Oral)   Resp 18   Ht 5' 10 (1.778 m)   Wt 79.4 kg   SpO2 99%   BMI 25.11 kg/m   Physical Exam Vitals and nursing note reviewed.  Constitutional:      General: He is not in acute distress.    Appearance: He is not ill-appearing or toxic-appearing.  HENT:     Head: Normocephalic and atraumatic.   Eyes:     General: No scleral icterus.       Right eye: No discharge.        Left eye: No discharge.     Conjunctiva/sclera: Conjunctivae normal.    Cardiovascular:     Rate and Rhythm: Normal rate.  Pulmonary:     Effort: Pulmonary effort is normal.  Abdominal:     General: Abdomen is flat.   Skin:    General: Skin is warm and dry.     Comments: <1cm wound on anterior left knee. Mild erythema surrounding without increased warmth or obvious swelling. Blood expressed/oozing when pressing on wound. Knee ROM intact.    Neurological:     General: No focal deficit present.     Mental Status: He is alert. Mental status is at baseline.   Psychiatric:        Mood and Affect: Mood normal.        Behavior: Behavior normal.     (all labs ordered are listed, but only abnormal results are displayed) Labs Reviewed  HEMOGLOBIN AND HEMATOCRIT, BLOOD - Abnormal; Notable for the following components:      Result Value   Hemoglobin 10.7 (*)    HCT 32.7 (*)    All other components within normal limits    EKG: None  Radiology: DG Knee 2 Views Left Result Date: 08/23/2023 CLINICAL DATA:  Fall. EXAM: LEFT KNEE - 1-2 VIEW COMPARISON:  March 04, 2018. FINDINGS: Status post left total knee arthroplasty. Left femoral and tibial components are well situated. No fracture or dislocation is noted. Vascular calcifications are noted. IMPRESSION: No acute abnormality seen. Electronically Signed   By: Lynwood Landy Raddle M.D.   On: 08/23/2023 09:51     Procedures   Medications Ordered in the ED  cephALEXin  (KEFLEX ) capsule 500 mg (has no  administration in time range)                                    Medical Decision Making Amount and/or Complexity of Data Reviewed Labs: ordered. Radiology: ordered.  Risk Prescription drug management.   This patient presents to the ED for concern of non-healing wound, this involves an extensive number of treatment  options, and is a complaint that carries with it a high risk of complications and morbidity.  The differential diagnosis includes wound dehiscence, deep space infection, hemorrhage   Co morbidities that complicate the patient evaluation  paroxysmal afib, HTN, HLD, DM, CHF, CKD, CAD,   Additional history obtained:  Additional history obtained from 5/30 ED note: patient seen after mechanical fall and discharged after reassuring workup    Problem List / ED Course / Critical interventions / Medication management  Patient presents ED concern for nonhealing wound on left knee.  Physical exam with mild erythema surrounding the wound without other signs of severe infection.  Knee ROM intact. I Ordered, and personally interpreted labs.  Hemoglobin with a mild drop from 11.9 to 10.7. I ordered imaging studies including left knee x-ray. I independently visualized and interpreted imaging which showed no acute process. I agree with the radiologist interpretation I cleaned patient's wound.  Applied Dermabond and Steri-Strip.  Then applied pressure dressing.  Educated patient to keep pressure dressing applied for 24 hours.  Patient agrees to follow-up with PCP.  I will also start patient on antibiotics to prevent further complications from this nonhealing wound.   Staffed with Dr. Ellouise. I have reviewed the patients home medicines and have made adjustments as needed The patient has been appropriately medically screened and/or stabilized in the ED. I have low suspicion for any other emergent medical condition which would require further screening, evaluation or treatment in the ED  or require inpatient management. At time of discharge the patient is hemodynamically stable and in no acute distress. I have discussed work-up results and diagnosis with patient and answered all questions. Patient is agreeable with discharge plan. We discussed strict return precautions for returning to the emergency department and they verbalized understanding.     Social Determinants of Health:  geriatric      Final diagnoses:  Wound healing, delayed    ED Discharge Orders          Ordered    cephALEXin  (KEFLEX ) 500 MG capsule  3 times daily        08/23/23 1023               Hoy Nidia FALCON, PA-C 08/23/23 1032    Ellouise Fine K, [place] 08/23/23 1528

## 2023-08-24 ENCOUNTER — Emergency Department (HOSPITAL_BASED_OUTPATIENT_CLINIC_OR_DEPARTMENT_OTHER)
Admission: EM | Admit: 2023-08-24 | Discharge: 2023-08-24 | Disposition: A | Discharge status: Home / Self Care | Attending: Emergency Medicine | Admitting: Emergency Medicine

## 2023-08-24 ENCOUNTER — Encounter (HOSPITAL_BASED_OUTPATIENT_CLINIC_OR_DEPARTMENT_OTHER): Payer: Self-pay

## 2023-08-24 DIAGNOSIS — Z7901 Long term (current) use of anticoagulants: Secondary | ICD-10-CM | POA: Insufficient documentation

## 2023-08-24 DIAGNOSIS — Z794 Long term (current) use of insulin: Secondary | ICD-10-CM | POA: Diagnosis not present

## 2023-08-24 DIAGNOSIS — I10 Essential (primary) hypertension: Secondary | ICD-10-CM | POA: Insufficient documentation

## 2023-08-24 DIAGNOSIS — M7989 Other specified soft tissue disorders: Secondary | ICD-10-CM | POA: Insufficient documentation

## 2023-08-24 DIAGNOSIS — E119 Type 2 diabetes mellitus without complications: Secondary | ICD-10-CM | POA: Diagnosis not present

## 2023-08-24 DIAGNOSIS — M25862 Other specified joint disorders, left knee: Secondary | ICD-10-CM | POA: Diagnosis not present

## 2023-08-24 DIAGNOSIS — M25569 Pain in unspecified knee: Secondary | ICD-10-CM | POA: Diagnosis present

## 2023-08-24 DIAGNOSIS — Z79899 Other long term (current) drug therapy: Secondary | ICD-10-CM | POA: Insufficient documentation

## 2023-08-24 MED ORDER — LIDOCAINE-EPINEPHRINE (PF) 2 %-1:200000 IJ SOLN
10.0000 mL | Freq: Once | INTRAMUSCULAR | Status: AC
  Administered 2023-08-24: 10 mL
  Filled 2023-08-24: qty 20

## 2023-08-24 NOTE — Discharge Instructions (Signed)
 You were seen in the ER for knee swelling. Our ultrasound revealed that you had fluid collection below the skin, we tried to remove the fluid, but noted that you had what appears to be posttraumatic cyst like tissue, which was also removed.  We recommend you follow-up with orthopedic surgery in about 10 days.  Continue the antibiotics that are prescribed.  Apply warm soaks to the affected area.  Keep the affected area clean and dry.

## 2023-08-24 NOTE — ED Notes (Signed)
Supplies at bedside.

## 2023-08-24 NOTE — ED Provider Notes (Signed)
 [place] EMERGENCY DEPARTMENT AT [facility] Provider Note   CSN: [id number] Arrival date & time: 08/24/23  0700     Patient presents with: Wound Check   Charles Lloyd. is a 83 y.o. male.   HPI     83 year old diabetic male with history of hypertension, anticoagulation use for A-fib comes in with chief complaint of persistent knee discomfort.  Patient states that 3 weeks ago he had a fall.  He scraped his knee.  Subsequently he had another traumatic injury to the same knee.  In the last week or so, the knee has been swollen and has been constantly draining fluid.  He came to the ER yesterday, dressing was applied -but he woke up this morning with his entire bed soaked with bloody fluid.  He denies any significant pain, able to range his knee.  Prior to Admission medications   Medication Sig Start Date End Date Taking? Authorizing Provider  ACCU-CHEK AVIVA PLUS test strip 1 EACH BY OTHER ROUTE DAILY. DX E11.9 Patient taking differently: 2 each by Other route daily. Dx E11.9 08/28/21   Theophilus Andrews, Tully GRADE, MD  albuterol  (VENTOLIN  HFA) 108 [phone] Base) MCG/ACT inhaler Inhale 1-2 puffs into the lungs every 6 (six) hours as needed for wheezing or shortness of breath. 10/05/20   Parrett, Madelin GORMAN, NP  allopurinol  (ZYLOPRIM ) 300 MG tablet TAKE 1 TABLET BY MOUTH EVERY DAY 07/18/23   Theophilus Andrews, Tully GRADE, MD  apixaban  (ELIQUIS ) 5 MG TABS tablet Take 1 tablet (5 mg total) by mouth 2 (two) times daily. 08/02/23   Theophilus Andrews, Tully GRADE, MD  atorvastatin  (LIPITOR) 20 MG tablet Take 1 tablet (20 mg total) by mouth daily. TAKE 1 TABLET DAILY (PLEASE SCHEDULE A PHYSICAL FOR MORE REFILLS) 06/04/23   Theophilus Andrews, Tully GRADE, MD  carvedilol  (COREG ) 3.125 MG tablet TAKE 1 TABLET BY MOUTH TWICE A DAY 07/09/23   Theophilus Andrews, Tully GRADE, MD  cephALEXin  (KEFLEX ) 500 MG capsule Take 1 capsule (500 mg total) by mouth 3 (three) times daily. 08/23/23   Hoy Nidia FALCON, PA-C   colchicine  0.6 MG tablet Take 1 tablet (0.6 mg total) by mouth 2 (two) times daily. 01/08/22   Theophilus Andrews, Tully GRADE, MD  Continuous Blood Gluc Receiver (FREESTYLE LIBRE 2 READER) DEVI 1 each by Does not apply route daily. 08/28/21   Theophilus Andrews, Tully GRADE, MD  Continuous Blood Gluc Sensor (FREESTYLE LIBRE 2 SENSOR) MISC 1 each by Does not apply route daily. 08/28/21   Theophilus Andrews, Tully GRADE, MD  doxycycline  (VIBRAMYCIN ) 100 MG capsule Take 1 capsule (100 mg total) by mouth 2 (two) times daily. 08/22/22   Burchette, Wolm ORN, MD  flecainide  (TAMBOCOR ) 100 MG tablet TAKE 1 TABLET BY MOUTH TWICE A DAY 01/15/23   Camnitz, Soyla Lunger, MD  fluticasone  (FLONASE ) 50 MCG/ACT nasal spray Place 2 sprays into both nostrils daily. 01/07/19   Rosario Leatrice FERNS, MD  fluticasone  furoate-vilanterol (BREO ELLIPTA ) 100-25 MCG/INH AEPB Inhale 1 puff into the lungs daily. 10/05/20   Parrett, Madelin GORMAN, NP  gabapentin  (NEURONTIN ) 300 MG capsule Take one tablet in the morning, one tablet in the afternoon, and 2 tablets at bedtime 04/22/23   Theophilus Andrews, Tully GRADE, MD  hydrALAZINE  (APRESOLINE ) 25 MG tablet TAKE 1 TABLET (25 MG TOTAL) BY MOUTH 4 (FOUR) TIMES DAILY. 07/23/23   Krasowski, Robert J, MD  insulin  glargine (LANTUS  SOLOSTAR) 100 UNIT/ML Solostar Pen Inject 8 Units into the skin daily. 09/29/21  Burchette, Wolm ORN, MD  irbesartan  (AVAPRO ) 150 MG tablet Take 1 tablet (150 mg total) by mouth daily. 12/10/22   Inocencio Soyla Lunger, MD  KLOR-CON  M20 20 MEQ tablet TAKE 1 TABLET BY MOUTH EVERY DAY 11/06/22   Theophilus Andrews, Tully GRADE, MD  meloxicam  (MOBIC ) 7.5 MG tablet TAKE 1 TABLET BY MOUTH EVERY DAY 08/13/23   Theophilus Andrews, Tully GRADE, MD  montelukast  (SINGULAIR ) 10 MG tablet Take 10 mg by mouth at bedtime.     [provider]  RESTASIS  0.05 % ophthalmic emulsion Place 1 drop into both eyes 2 (two) times daily as needed (dry eyes). 12/09/19   [provider]  torsemide  (DEMADEX ) 20 MG  tablet Take 1 tablet (20 mg total) by mouth 2 (two) times daily. 05/03/23   Krasowski, Robert J, MD    Allergies: Amlodipine     Review of Systems  All other systems reviewed and are negative.   Updated Vital Signs BP (!) 166/82 (BP Location: Right Arm)   Pulse 77   Temp 98.5 F (36.9 C)   Resp 16   Ht 5' 10 (1.778 m)   Wt 79.4 kg   SpO2 98%   BMI 25.11 kg/m   Physical Exam Vitals and nursing note reviewed.  Constitutional:      Appearance: He is well-developed.  HENT:     Head: Atraumatic.   Cardiovascular:     Rate and Rhythm: Normal rate.  Pulmonary:     Effort: Pulmonary effort is normal.   Musculoskeletal:        General: Swelling present.     Cervical back: Neck supple.     Comments: Patient has pitting edema around the knee.  There is a hyperpigmented lesion in the infrapatellar region, that looks like a blister.  Patient able to flex and extend the knee.   Skin:    General: Skin is warm.   Neurological:     Mental Status: He is alert and oriented to person, place, and time.          (all labs ordered are listed, but only abnormal results are displayed) Labs Reviewed - No data to display  EKG: None  Radiology: DG Knee 2 Views Left Result Date: 08/23/2023 CLINICAL DATA:  Fall. EXAM: LEFT KNEE - 1-2 VIEW COMPARISON:  March 04, 2018. FINDINGS: Status post left total knee arthroplasty. Left femoral and tibial components are well situated. No fracture or dislocation is noted. Vascular calcifications are noted. IMPRESSION: No acute abnormality seen. Electronically Signed   By: Lynwood Landy Raddle M.D.   On: 08/23/2023 09:51     .Ultrasound ED Soft Tissue  Date/Time: 08/24/2023 9:31 AM  Performed by: Charlyn Sora, MD Authorized by: Charlyn Sora, MD   Procedure details:    Indications: localization of abscess and evaluate for cellulitis     Transverse view:  Visualized   Longitudinal view:  Visualized   Images: archived   Location:     Location: lower extremity     Side:  Left Findings:     no abscess present    no cellulitis present Comments:     Patient has fluid buildup diffusely around the knee, just in the subcutaneous tissue.  No involvement of the joint .Incision and Drainage  Date/Time: 08/24/2023 9:34 AM  Performed by: Charlyn Sora, MD Authorized by: Charlyn Sora, MD   Consent:    Consent obtained:  Verbal   Consent given by:  Patient   Risks, benefits, and alternatives were discussed:  yes     Risks discussed:  Bleeding, incomplete drainage, pain, infection and damage to other organs   Alternatives discussed:  No treatment Universal protocol:    Procedure explained and questions answered to patient or proxy's satisfaction: yes     Immediately prior to procedure, a time out was called: yes     Patient identity confirmed:  Arm band Location:    Type:  Fluid collection   Size:  12 cm   Location:  Lower extremity   Lower extremity location:  Knee   Knee location:  L knee Pre-procedure details:    Skin preparation:  Chlorhexidine with alcohol Sedation:    Sedation type:  None Anesthesia:    Anesthesia method:  Local infiltration   Local anesthetic:  Lidocaine  2% WITH epi Procedure type:    Complexity:  Complex Procedure details:    Ultrasound guidance: yes     Needle aspiration: yes     Needle size:  18 G   Incision types:  Stab incision   Incision depth:  Subcutaneous   Wound management:  Probed and deloculated, irrigated with saline and extensive cleaning   Drainage:  Serosanguinous   Drainage amount:  Copious   Wound treatment:  Drain placed   Packing materials:  1/4 in iodoform gauze   Amount 1/4 iodoform:  5 Post-procedure details:    Procedure completion:  Tolerated well, no immediate complications    Medications Ordered in the ED  lidocaine -EPINEPHrine (XYLOCAINE  W/EPI) 2 %-1:200000 (PF) injection 10 mL (10 mLs Infiltration Given 08/24/23 0813)                                     Medical Decision Making Risk Prescription drug management.   83 year old male with history of diabetes, A-fib on Eliquis  comes with a chief complaint of persistent knee pain and swelling.  Patient was seen yesterday, and given some wound care.  He was started on antibiotics.  He states that this morning he woke up with significant bleeding in his bed.  Patient does not have any systemic symptoms right now.  I reviewed patient's records including x-ray from yesterday.  No repeat imaging indicated.  Based on exam, differential includes cellulitis, posttraumatic seroma, bursitis, abscess.  My exam is not consistent with abscess, but there could be cellulitis.  I offered conservative approach of continued warm soaks, but patient has a trip coming up to [facility] next week.  The amount of swelling and the continuous fluid drainage is affecting his ability to manage.  Therefore, we proceeded to perform bedside ultrasound.  I could appreciate some debris on ultrasound.  I proceeded to go ahead with incision and drainage, but what was noted was patient has a large cyst.  The cyst was drained at the best of my ability.  Knee appears significantly improved.  Copious amount of drainage appreciated, mostly serosanguineous.  No pus.  Advised that he still call his orthopedist for a follow-up, in case we have not gotten all of the cyst out.  Strict ER return precautions have been discussed, and patient is agreeing with the plan and is comfortable with the workup done and the recommendations from the ER.   Final diagnoses:  Cyst of soft tissue  Cyst of left knee joint    ED Discharge Orders     None          Charlyn Sora, MD 08/24/23 [phone]

## 2023-08-24 NOTE — ED Notes (Signed)
 Dc instructions reviewed with patient. Patient voiced understanding. Dc with belongings.

## 2023-08-24 NOTE — ED Triage Notes (Signed)
 In for further eval of bleeding from left knee wound. Was seen yesterday and dermabond placed on wound. Fall approx 3 weeks ago.

## 2023-08-26 ENCOUNTER — Telehealth: Payer: Self-pay

## 2023-08-26 ENCOUNTER — Encounter: Payer: Self-pay | Admitting: Orthopaedic Surgery

## 2023-08-26 ENCOUNTER — Ambulatory Visit: Admitting: Orthopaedic Surgery

## 2023-08-26 DIAGNOSIS — S81002D Unspecified open wound, left knee, subsequent encounter: Secondary | ICD-10-CM | POA: Diagnosis not present

## 2023-08-26 MED ORDER — DOXYCYCLINE HYCLATE 100 MG PO TABS: 100.0000 mg | ORAL_TABLET | Freq: Two times a day (BID) | ORAL | 0 refills | Status: DC

## 2023-08-26 NOTE — Progress Notes (Signed)
 The patient is an 83 year old gentleman that I am seeing for the first time.  He is referred from the emergency room with a left knee chronic wound after mechanical fall.  He has a history of both his knees being replaced and these were done in [place] .  He had a mechanical fall a few weeks ago injuring his left knee and he developed a prepatellar bursal collection.  This was drained by the emergency room staff and now has a small chronic wound on the anterior aspect of his knee.  He has a knee replacement on that side and was started on Keflex .  Examination of his left knee shows no knee joint effusion but I did still clean the knee with Betadine and alcohol and only aspirated 10 cc of clear fluid from his left knee that is not indicative of a knee joint infection fortunately.  I was still able to express fluid from a small circular wound over the prepatellar bursa area inferior to the patella itself.  There is no cellulitis associated with this.  I will need him to take doxycycline  twice daily instead of Keflex .  I want him to clean the wound on a daily basis in the shower and he does have a shower nozzle where he can irrigate the wound.  He will then place Bactroban ointment on the wound daily.  I will then like to see him back in 2 weeks after wound treatment to see how he is doing overall.  If things worsen he knows to let us  know.

## 2023-08-26 NOTE — Transitions of Care (Post Inpatient/ED Visit) (Signed)
   08/26/2023  Name: Charles Lloyd. MRN: [id number] DOB: 1940-11-16  Today's TOC FU Call Status:   Patient's Name and Date of Birth confirmed.  Transition Care Management Follow-up Telephone Call    Items Reviewed:    Medications Reviewed Today: Medications Reviewed Today   Medications were not reviewed in this encounter     Home Care and Equipment/Supplies:    Functional Questionnaire:    Follow up appointments reviewed:   Call was unsuccessful. Will try again.     SIGNATURE: Ellinor Test, CMA

## 2023-08-28 ENCOUNTER — Telehealth: Payer: Self-pay

## 2023-08-28 NOTE — Transitions of Care (Post Inpatient/ED Visit) (Signed)
   08/28/2023  Name: Charles Lloyd. MRN: [id number] DOB: Jul 11, 1940  Today's TOC FU Call Status: Today's TOC FU Call Status:: Unsuccessful Call (2nd Attempt) Unsuccessful Call (2nd Attempt) Date: 08/28/23  Attempted to reach the patient regarding the most recent Inpatient/ED visit.  Follow Up Plan: Additional outreach attempts will be made to reach the patient to complete the Transitions of Care (Post Inpatient/ED visit) call.   Signature :Rea RONAL Silvan CMA 4:04 pm

## 2023-08-29 ENCOUNTER — Telehealth: Payer: Self-pay

## 2023-08-29 NOTE — Transitions of Care (Post Inpatient/ED Visit) (Signed)
   08/29/2023  Name: Charles Lloyd Juanito Mickey. MRN: [id number] DOB: 09-Jan-1941  Today's TOC FU Call Status:    Attempted to reach the patient regarding the most recent Inpatient/ED visit.  Follow Up Plan: Additional outreach attempts will be made to reach the patient to complete the Transitions of Care (Post Inpatient/ED visit) call.   Signature: Rekisha Welling, CMA

## 2023-09-01 ENCOUNTER — Other Ambulatory Visit: Payer: Self-pay | Admitting: Internal Medicine

## 2023-09-03 ENCOUNTER — Ambulatory Visit (HOSPITAL_BASED_OUTPATIENT_CLINIC_OR_DEPARTMENT_OTHER)

## 2023-09-09 ENCOUNTER — Other Ambulatory Visit: Payer: Self-pay | Admitting: Internal Medicine

## 2023-09-09 DIAGNOSIS — G6289 Other specified polyneuropathies: Secondary | ICD-10-CM

## 2023-09-11 ENCOUNTER — Ambulatory Visit: Admitting: Orthopaedic Surgery

## 2023-09-11 ENCOUNTER — Inpatient Hospital Stay: Admitting: Internal Medicine

## 2023-09-11 ENCOUNTER — Encounter: Payer: Self-pay | Admitting: Orthopaedic Surgery

## 2023-09-11 DIAGNOSIS — S81002D Unspecified open wound, left knee, subsequent encounter: Secondary | ICD-10-CM

## 2023-09-11 NOTE — Progress Notes (Signed)
 The patient comes in today for follow-up as a relates to a left knee wound that occurred after mechanical fall.  He had knee replacements done elsewhere years ago.  We were treating the wound with doxycycline  from an antibiotic standpoint and mupirocin ointment.  He is also been cleaning it every day.  The wound over the anterior aspect of his knee distal today patella looks great.  It is almost healed over completely.  There is no evidence of infection at this point and no edema or erythema.  He will continue wound care until this heals up completely and that should with time based on how good he looks.  He has been in the knee well.  He feels good overall about the wound.  If it does worsen anyway he knows to reach out and let us  know.

## 2023-09-16 ENCOUNTER — Ambulatory Visit: Admitting: Internal Medicine

## 2023-09-16 ENCOUNTER — Encounter: Payer: Self-pay | Admitting: Internal Medicine

## 2023-09-16 VITALS — BP 128/78 | HR 65 | Temp 97.9°F | Wt 177.0 lb

## 2023-09-16 DIAGNOSIS — Z09 Encounter for follow-up examination after completed treatment for conditions other than malignant neoplasm: Secondary | ICD-10-CM

## 2023-09-16 DIAGNOSIS — M25862 Other specified joint disorders, left knee: Secondary | ICD-10-CM | POA: Diagnosis not present
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
## 2023-09-17 ENCOUNTER — Other Ambulatory Visit: Payer: Self-pay | Admitting: Internal Medicine

## 2023-09-17 ENCOUNTER — Telehealth: Payer: Self-pay | Admitting: *Deleted

## 2023-09-17 DIAGNOSIS — R0602 Shortness of breath: Secondary | ICD-10-CM

## 2023-09-17 MED ORDER — POTASSIUM CHLORIDE CRYS ER 20 MEQ PO TBCR: 20.0000 meq | EXTENDED_RELEASE_TABLET | Freq: Every day | ORAL | 3 refills | Status: DC

## 2023-09-17 NOTE — Telephone Encounter (Signed)
 Copied from CRM [id number]. Topic: Clinical - Medication Question >> Sep 17, 2023  8:30 AM Carlatta H wrote: Reason for CRM: the patient was suppose to have a prescription for Potassium written from appointment yesterday//Prescription was never received by the pharmacy//Patient pharmacy is [id number] [address] [place], [place] - [address] [place] [address] Phone: [phone] Fax: [phone] Hours: Not open 24 hours

## 2023-09-18 ENCOUNTER — Other Ambulatory Visit: Payer: Self-pay | Admitting: Internal Medicine

## 2023-09-18 DIAGNOSIS — R0602 Shortness of breath: Secondary | ICD-10-CM

## 2023-09-18 MED ORDER — POTASSIUM CHLORIDE CRYS ER 20 MEQ PO TBCR: 20.0000 meq | EXTENDED_RELEASE_TABLET | Freq: Every day | ORAL | 3 refills | Status: DC

## 2023-09-18 NOTE — Telephone Encounter (Signed)
 Rx sent.

## 2023-09-22 ENCOUNTER — Other Ambulatory Visit: Payer: Self-pay

## 2023-09-22 ENCOUNTER — Emergency Department (HOSPITAL_BASED_OUTPATIENT_CLINIC_OR_DEPARTMENT_OTHER): Admission: EM | Admit: 2023-09-22 | Discharge: 2023-09-22 | Disposition: A | Discharge status: Home / Self Care

## 2023-09-22 DIAGNOSIS — Z7901 Long term (current) use of anticoagulants: Secondary | ICD-10-CM | POA: Diagnosis not present

## 2023-09-22 DIAGNOSIS — M545 Low back pain, unspecified: Secondary | ICD-10-CM | POA: Insufficient documentation

## 2023-09-22 DIAGNOSIS — G8929 Other chronic pain: Secondary | ICD-10-CM | POA: Diagnosis not present

## 2023-09-22 MED ORDER — KETOROLAC TROMETHAMINE 15 MG/ML IJ SOLN
15.0000 mg | Freq: Once | INTRAMUSCULAR | Status: AC
  Administered 2023-09-22: 15 mg via INTRAMUSCULAR
  Filled 2023-09-22: qty 1

## 2023-09-22 MED ORDER — LIDOCAINE 5 % EX PTCH: 1.0000 | MEDICATED_PATCH | CUTANEOUS | 0 refills | Status: DC

## 2023-09-22 MED ORDER — CYCLOBENZAPRINE HCL 5 MG PO TABS
5.0000 mg | ORAL_TABLET | Freq: Once | ORAL | Status: AC
  Administered 2023-09-22: 5 mg via ORAL
  Filled 2023-09-22: qty 1

## 2023-09-22 MED ORDER — LIDOCAINE 5 % EX PTCH
1.0000 | MEDICATED_PATCH | CUTANEOUS | Status: DC
  Administered 2023-09-22: 1 via TRANSDERMAL
  Filled 2023-09-22: qty 1

## 2023-09-22 MED ORDER — CYCLOBENZAPRINE HCL 10 MG PO TABS: 5.0000 mg | ORAL_TABLET | Freq: Two times a day (BID) | ORAL | 0 refills | Status: DC | PRN

## 2023-09-22 NOTE — Discharge Instructions (Signed)
 You can take the Flexeril .  Do not take this and drive.  Do not take this with alcohol or other sedatives.  You can also use the lidocaine  patch to see if this helps.   Otherwise, please follow-up with your spine doctor.  If you have any kind of bowel or bladder incontinence or any kind of numbness in groin then please come at the ED immediately.  Continue with the gabapentin 

## 2023-09-22 NOTE — ED Triage Notes (Signed)
 Arrive by POV,complaining of back pain radiating in BLE since yesterday.No trauma/injury

## 2023-09-22 NOTE — ED Provider Notes (Signed)
 [place] EMERGENCY DEPARTMENT AT [facility] Provider Note   CSN: [id number] Arrival date & time: 09/22/23  9175     Patient presents with: Back Pain   Charles Lloyd. is a 83 y.o. male.   HPI   Presents with lower back pain.  Patient states he has a chronic history of lower back pain for.  Follows up with spine surgery for this.  Usually gets injections about once every 2 to 3 months.  Patient woke up yesterday morning was having significant lower back pain.  No bowel bladder incontinence.  No saddle anesthesia.  Patient states is radiate to bilateral buttocks area with sometimes happens with his chronic back pain.  But this is to be worse.  Patient states he played golf on Wednesday but was asymptomatic.  No falls.  Patient states that he is not taking thing for pain today.  Patient states that he has had no recent falls.  Able to ambulate though with pain.  No chest pain or shortness of breath.  No abdominal pain.  Once again, no new numbness or tingling in lower extremities.  No bowel or bladder incontinence.  No fever no chills.   Previous medical history reviewed : Patient was seen in the ED on June 28.  Was seen because of knee pain and swelling at that point time.  Feel like there is a large cyst appreciated that time.  Advise follow-up with orthopedic surgery.  Follow-up with orthopedic surgery.  The wound over the distal patella looked healed at that point time on July 16.  Prior to Admission medications   Medication Sig Start Date End Date Taking? Authorizing Provider  cyclobenzaprine  (FLEXERIL ) 10 MG tablet Take 0.5 tablets (5 mg total) by mouth 2 (two) times daily as needed for muscle spasms. 09/22/23  Yes Simon Lavonia SAILOR, MD  lidocaine  (LIDODERM ) 5 % Place 1 patch onto the skin daily. Remove & Discard patch within 12 hours or as directed by MD 09/22/23  Yes Simon Lavonia SAILOR, MD  ACCU-CHEK AVIVA PLUS test strip 1 EACH BY OTHER ROUTE DAILY. DX E11.9 Patient taking  differently: 2 each by Other route daily. Dx E11.9 08/28/21   Theophilus Andrews, Tully GRADE, MD  albuterol  (VENTOLIN  HFA) 108 [phone] Base) MCG/ACT inhaler Inhale 1-2 puffs into the lungs every 6 (six) hours as needed for wheezing or shortness of breath. 10/05/20   Parrett, Madelin GORMAN, NP  allopurinol  (ZYLOPRIM ) 300 MG tablet TAKE 1 TABLET BY MOUTH EVERY DAY 07/18/23   Theophilus Andrews, Tully GRADE, MD  apixaban  (ELIQUIS ) 5 MG TABS tablet Take 1 tablet (5 mg total) by mouth 2 (two) times daily. 08/02/23   Theophilus Andrews, Tully GRADE, MD  atorvastatin  (LIPITOR) 20 MG tablet TAKE 1 TABLET (20 MG TOTAL) BY MOUTH DAILY. TAKE 1 TABLET DAILY (PLEASE SCHEDULE A PHYSICAL FOR MORE REFILLS) 09/03/23   Theophilus Andrews, Tully GRADE, MD  carvedilol  (COREG ) 3.125 MG tablet TAKE 1 TABLET BY MOUTH TWICE A DAY 07/09/23   Theophilus Andrews, Tully GRADE, MD  cephALEXin  (KEFLEX ) 500 MG capsule Take 1 capsule (500 mg total) by mouth 3 (three) times daily. 08/23/23   Hoy Nidia FALCON, PA-C  colchicine  0.6 MG tablet Take 1 tablet (0.6 mg total) by mouth 2 (two) times daily. 01/08/22   Theophilus Andrews, Tully GRADE, MD  Continuous Blood Gluc Receiver (FREESTYLE LIBRE 2 READER) DEVI 1 each by Does not apply route daily. 08/28/21   Theophilus Andrews Tully GRADE, MD  Continuous Blood Gluc  Sensor (FREESTYLE LIBRE 2 SENSOR) MISC 1 each by Does not apply route daily. 08/28/21   Theophilus Andrews, Tully GRADE, MD  doxycycline  (VIBRA -TABS) 100 MG tablet Take 1 tablet (100 mg total) by mouth 2 (two) times daily. 08/26/23   Vernetta Lonni GRADE, MD  flecainide  (TAMBOCOR ) 100 MG tablet TAKE 1 TABLET BY MOUTH TWICE A DAY 01/15/23   Camnitz, Soyla Lunger, MD  fluticasone  (FLONASE ) 50 MCG/ACT nasal spray Place 2 sprays into both nostrils daily. 01/07/19   Rosario Leatrice FERNS, MD  fluticasone  furoate-vilanterol (BREO ELLIPTA ) 100-25 MCG/INH AEPB Inhale 1 puff into the lungs daily. 10/05/20   Parrett, Madelin RAMAN, NP  gabapentin  (NEURONTIN ) 300 MG capsule TAKE ONE TABLET IN THE  MORNING, ONE TABLET IN THE AFTERNOON, AND 2 TABLETS AT BEDTIME 09/09/23   Theophilus Andrews, Tully GRADE, MD  hydrALAZINE  (APRESOLINE ) 25 MG tablet TAKE 1 TABLET (25 MG TOTAL) BY MOUTH 4 (FOUR) TIMES DAILY. 07/23/23   Krasowski, Robert J, MD  insulin  glargine (LANTUS  SOLOSTAR) 100 UNIT/ML Solostar Pen Inject 8 Units into the skin daily. 09/29/21   Burchette, Wolm ORN, MD  irbesartan  (AVAPRO ) 150 MG tablet Take 1 tablet (150 mg total) by mouth daily. 12/10/22   Camnitz, Soyla Lunger, MD  meloxicam  (MOBIC ) 7.5 MG tablet TAKE 1 TABLET BY MOUTH EVERY DAY 08/13/23   Theophilus Andrews, Tully GRADE, MD  montelukast  (SINGULAIR ) 10 MG tablet Take 10 mg by mouth at bedtime.     [provider]  potassium chloride  SA (KLOR-CON  M20) 20 MEQ tablet Take 1 tablet (20 mEq total) by mouth daily. 09/18/23   Theophilus Andrews, Tully GRADE, MD  RESTASIS  0.05 % ophthalmic emulsion Place 1 drop into both eyes 2 (two) times daily as needed (dry eyes). 12/09/19   [provider]  torsemide  (DEMADEX ) 20 MG tablet Take 1 tablet (20 mg total) by mouth 2 (two) times daily. 05/03/23   Krasowski, Robert J, MD    Allergies: Amlodipine     Review of Systems  Constitutional:  Negative for chills and fever.  HENT:  Negative for ear pain and sore throat.   Eyes:  Negative for pain and visual disturbance.  Respiratory:  Negative for cough and shortness of breath.   Cardiovascular:  Negative for chest pain and palpitations.  Gastrointestinal:  Negative for abdominal pain and vomiting.  Genitourinary:  Negative for dysuria and hematuria.  Musculoskeletal:  Negative for arthralgias and back pain.  Skin:  Negative for color change and rash.  Neurological:  Negative for seizures and syncope.  All other systems reviewed and are negative.   Updated Vital Signs BP (!) 155/72   Pulse (!) 57   Temp 98.4 F (36.9 C) (Oral)   Resp 18   Ht 5' 10 (1.778 m)   Wt 79.4 kg   SpO2 94%   BMI 25.11 kg/m   Physical Exam Vitals and  nursing note reviewed.  Constitutional:      General: He is not in acute distress.    Appearance: He is well-developed.  HENT:     Head: Normocephalic and atraumatic.  Eyes:     Conjunctiva/sclera: Conjunctivae normal.  Cardiovascular:     Rate and Rhythm: Normal rate and regular rhythm.     Heart sounds: No murmur heard. Pulmonary:     Effort: Pulmonary effort is normal. No respiratory distress.     Breath sounds: Normal breath sounds.  Abdominal:     Palpations: Abdomen is soft.     Tenderness: There is no abdominal  tenderness.  Musculoskeletal:        General: No swelling.       Arms:     Cervical back: Neck supple.  Skin:    General: Skin is warm and dry.     Capillary Refill: Capillary refill takes less than 2 seconds.  Neurological:     Mental Status: He is alert.  Psychiatric:        Mood and Affect: Mood normal.     (all labs ordered are listed, but only abnormal results are displayed) Labs Reviewed - No data to display  EKG: None  Radiology: No results found.   Procedures   Medications Ordered in the ED  lidocaine  (LIDODERM ) 5 % 1 patch (1 patch Transdermal Patch Applied 09/22/23 0848)  ketorolac  (TORADOL ) 15 MG/ML injection 15 mg (15 mg Intramuscular Given 09/22/23 0847)  cyclobenzaprine  (FLEXERIL ) tablet 5 mg (5 mg Oral Given 09/22/23 0847)                                    Medical Decision Making Risk Prescription drug management.     Previous medical history reviewed : Patient was seen in the ED on June 28.  Was seen because of knee pain and swelling at that point time.  Feel like there is a large cyst appreciated that time.  Advise follow-up with orthopedic surgery.  Follow-up with orthopedic surgery.  The wound over the distal patella looked healed at that point time on July 16.   Presents with lower back pain.  Patient states he has a chronic history of lower back pain for.  Follows up with spine surgery for this.  Usually gets injections  about once every 2 to 3 months.  Patient woke up yesterday morning was having significant lower back pain.  No bowel bladder incontinence.  No saddle anesthesia.  Patient states is radiate to bilateral buttocks area with sometimes happens with his chronic back pain.  But this is to be worse.  Patient states he played golf on Wednesday but was asymptomatic.  No falls.  Patient states that he is not taking thing for pain today.  Patient states that he has had no recent falls.  Able to ambulate though with pain.  No chest pain or shortness of breath.  No abdominal pain.  Once again, no new numbness or tingling in lower extremities.  No bowel or bladder incontinence.  No fever no chills.   Previous medical history reviewed : Patient was seen in the ED on June 28.  Was seen because of knee pain and swelling at that point time.  Feel like there is a large cyst appreciated that time.  Advise follow-up with orthopedic surgery.  Follow-up with orthopedic surgery.  The wound over the distal patella looked healed at that point time on July 16.  Patient has pain to palpation of bilateral SI joint area.  Patient is neurologically intact.  Sensation intact in bilateral lower extremities.  Strength 5-5 at bilateral hips.  Equal.  Equal extension and flexion of bilateral knees.  Equal plantar and dorsiflexion of bilateral feet.  2+ dorsal pedal pulses.  2+ posterior tibial pulses.  No concerns, ischemic pathology.  Currently no concern for any kind of cauda equina or other central cord pathology based off my exam as well as no evidence of bowel bladder continence or saddle anesthesia.  Do think this is likely acute on chronic back  pain.  More sacroiliac in nature.    The patient lidocaine  patch, 1 shot of Toradol .  Do not want given more Toradol  given history of AKI.  Should not be taken this home or any other, NSAID.  I also gave patient small dose of Flexeril .  Patient remained neurologically intact.  No concerns for  Ischemic pathology.  No concern for acute intra-abdominal AAA or dissection.  This seems to be all MSK.   Prescribed 5 mg flexeril  BID PRN as well as lidocaine  patch.   Patient will follow-up with spine surgeon.  He follows with him every 2 to 3 months.     Final diagnoses:  Chronic low back pain without sciatica, unspecified back pain laterality    ED Discharge Orders          Ordered    cyclobenzaprine  (FLEXERIL ) 10 MG tablet  2 times daily PRN        09/22/23 1018    lidocaine  (LIDODERM ) 5 %  Every 24 hours        09/22/23 1018               Simon Lavonia SAILOR, MD 09/22/23 1022

## 2023-09-23 ENCOUNTER — Other Ambulatory Visit: Payer: Self-pay | Admitting: Orthopaedic Surgery

## 2023-10-03 ENCOUNTER — Other Ambulatory Visit: Payer: Self-pay | Admitting: Internal Medicine

## 2023-10-03 ENCOUNTER — Ambulatory Visit (HOSPITAL_BASED_OUTPATIENT_CLINIC_OR_DEPARTMENT_OTHER)
Admission: RE | Admit: 2023-10-03 | Discharge: 2023-10-03 | Disposition: A | Discharge status: Home / Self Care | Source: Ambulatory Visit | Attending: Cardiology | Admitting: Cardiology

## 2023-10-03 DIAGNOSIS — R0609 Other forms of dyspnea: Secondary | ICD-10-CM | POA: Diagnosis present

## 2023-10-03 LAB — ECHOCARDIOGRAM COMPLETE
AR max vel: 2.03 cm2
AV Area VTI: 1.98 cm2
AV Area mean vel: 2.09 cm2
AV Mean grad: 4 mmHg
AV Peak grad: 8 mmHg
Ao pk vel: 1.41 m/s
Area-P 1/2: 3.39 cm2
Calc EF: 52.1 %
MV M vel: 4.8 m/s
MV Peak grad: 92.2 mmHg
S' Lateral: 3.5 cm
Single Plane A2C EF: 51.1 %
Single Plane A4C EF: 51.6 %

## 2023-10-07 ENCOUNTER — Telehealth: Payer: Self-pay

## 2023-10-07 ENCOUNTER — Ambulatory Visit: Payer: Self-pay | Admitting: Cardiology

## 2023-10-07 NOTE — Telephone Encounter (Signed)
 Left message on My Chart with Echo results per Dr. Karry note. Routed to PCP.

## 2023-10-08 ENCOUNTER — Emergency Department (HOSPITAL_BASED_OUTPATIENT_CLINIC_OR_DEPARTMENT_OTHER)

## 2023-10-08 ENCOUNTER — Ambulatory Visit: Payer: Self-pay

## 2023-10-08 ENCOUNTER — Emergency Department (HOSPITAL_BASED_OUTPATIENT_CLINIC_OR_DEPARTMENT_OTHER)
Admission: EM | Admit: 2023-10-08 | Discharge: 2023-10-08 | Disposition: A | Discharge status: Home / Self Care | Source: Ambulatory Visit | Attending: Emergency Medicine | Admitting: Emergency Medicine

## 2023-10-08 ENCOUNTER — Encounter (HOSPITAL_BASED_OUTPATIENT_CLINIC_OR_DEPARTMENT_OTHER): Payer: Self-pay

## 2023-10-08 ENCOUNTER — Other Ambulatory Visit (HOSPITAL_BASED_OUTPATIENT_CLINIC_OR_DEPARTMENT_OTHER): Payer: Self-pay

## 2023-10-08 ENCOUNTER — Other Ambulatory Visit: Payer: Self-pay

## 2023-10-08 DIAGNOSIS — I110 Hypertensive heart disease with heart failure: Secondary | ICD-10-CM | POA: Diagnosis not present

## 2023-10-08 DIAGNOSIS — Z79899 Other long term (current) drug therapy: Secondary | ICD-10-CM | POA: Insufficient documentation

## 2023-10-08 DIAGNOSIS — I509 Heart failure, unspecified: Secondary | ICD-10-CM | POA: Diagnosis not present

## 2023-10-08 DIAGNOSIS — E1140 Type 2 diabetes mellitus with diabetic neuropathy, unspecified: Secondary | ICD-10-CM | POA: Insufficient documentation

## 2023-10-08 DIAGNOSIS — Z794 Long term (current) use of insulin: Secondary | ICD-10-CM | POA: Diagnosis not present

## 2023-10-08 DIAGNOSIS — M48061 Spinal stenosis, lumbar region without neurogenic claudication: Secondary | ICD-10-CM | POA: Diagnosis not present

## 2023-10-08 DIAGNOSIS — R109 Unspecified abdominal pain: Secondary | ICD-10-CM | POA: Diagnosis not present

## 2023-10-08 DIAGNOSIS — Z7901 Long term (current) use of anticoagulants: Secondary | ICD-10-CM | POA: Diagnosis not present

## 2023-10-08 DIAGNOSIS — M545 Low back pain, unspecified: Secondary | ICD-10-CM | POA: Diagnosis present

## 2023-10-08 DIAGNOSIS — M4800 Spinal stenosis, site unspecified: Secondary | ICD-10-CM

## 2023-10-08 LAB — BASIC METABOLIC PANEL WITH GFR
Anion gap: 11 (ref 5–15)
BUN: 10 mg/dL (ref 8–23)
CO2: 24 mmol/L (ref 22–32)
Calcium: 9.2 mg/dL (ref 8.9–10.3)
Chloride: 99 mmol/L (ref 98–111)
Creatinine, Ser: 0.85 mg/dL (ref 0.61–1.24)
GFR, Estimated: >60 mL/min (ref >60)
Glucose, Bld: 115 mg/dL — ABNORMAL HIGH (ref 70–99)
Potassium: 3.7 mmol/L (ref 3.5–5.1)
Sodium: 133 mmol/L — ABNORMAL LOW (ref 135–145)

## 2023-10-08 LAB — CBC
HCT: 31.8 % — ABNORMAL LOW (ref 39.0–52.0)
Hemoglobin: 10.7 g/dL — ABNORMAL LOW (ref 13.0–17.0)
MCH: 31.1 pg (ref 26.0–34.0)
MCHC: 33.6 g/dL (ref 30.0–36.0)
MCV: 92.4 fL (ref 80.0–100.0)
Platelets: 179 K/uL (ref 150–400)
RBC: 3.44 MIL/uL — ABNORMAL LOW (ref 4.22–5.81)
RDW: 15 % (ref 11.5–15.5)
WBC: 5.5 K/uL (ref 4.0–10.5)
nRBC: 0 % (ref 0.0–0.2)

## 2023-10-08 MED ORDER — MORPHINE SULFATE (PF) 4 MG/ML IV SOLN
4.0000 mg | Freq: Once | INTRAVENOUS | Status: AC
  Administered 2023-10-08 (×2): 4 mg via INTRAVENOUS
  Filled 2023-10-08: qty 1

## 2023-10-08 MED ORDER — ONDANSETRON HCL 4 MG/2ML IJ SOLN
4.0000 mg | Freq: Once | INTRAMUSCULAR | Status: AC
  Administered 2023-10-08 (×2): 4 mg via INTRAVENOUS
  Filled 2023-10-08: qty 2

## 2023-10-08 MED ORDER — HYDROCODONE-ACETAMINOPHEN 5-325 MG PO TABS
1.0000 | ORAL_TABLET | Freq: Four times a day (QID) | ORAL | 0 refills | Status: DC | PRN
  Filled 2023-10-08: qty 12, 3d supply, fill #0

## 2023-10-08 MED ORDER — PREDNISONE 10 MG PO TABS
ORAL_TABLET | ORAL | 0 refills | Status: AC
  Filled 2023-10-08: qty 20, 8d supply, fill #0

## 2023-10-08 NOTE — Telephone Encounter (Signed)
 FYI Only or Action Required?: FYI only for provider.  Patient was last seen in primary care on 09/16/2023 by Theophilus Andrews, Tully GRADE, MD.  Called Nurse Triage reporting Fall.  Symptoms began yesterday.  Interventions attempted: Rest, hydration, or home remedies.  Symptoms are: rapidly worsening.  Triage Disposition: Go to ED Now (or PCP Triage)  Patient/caregiver understands and will follow disposition?: Yes  FYI: Patient had a similar fall 2 weeks ago and was seen in ED.  Was having mild weakness from that fall. Now, last night he experienced some weakness and his knee buckled causing him to fall and land directly on his buttocks. Patient was down for 2 hours before family arrived. Pt now endorse moderate weakness and 9/10 back pain and mild-moderate abd pain when trying to stand. ED advised. Pt agreeable, will have family transport \ Copied from CRM [id number]. Topic: Clinical - Red Word Triage >> Oct 08, 2023  9:01 AM Macario HERO wrote: Red Word that prompted transfer to Nurse Triage: Patient said he fell last night, woke up this morning and can hardly walk. Reason for Disposition  Patient sounds very sick or weak to the triager  Answer Assessment - Initial Assessment Questions 1. MECHANISM: How did the fall happen?     Left leg gave out   2. DOMESTIC VIOLENCE AND ELDER ABUSE SCREENING: Did you fall because someone pushed you or tried to hurt you? If Yes, ask: Are you safe now?     No  3. ONSET: When did the fall happen? (e.g., minutes, hours, or days ago)     [name] last night and had a fall 2 weeks ago, but went to the ED then  4. LOCATION: What part of the body hit the ground? (e.g., back, buttocks, head, hips, knees, hands, head, stomach)     Fell on buttocks  5. INJURY: Did you hurt (injure) yourself when you fell? If Yes, ask: What did you injure? Tell me more about this? (e.g., body area; type of injury; pain severity)     Injured low back  6. PAIN: Is  there any pain? If Yes, ask: How bad is the pain? (e.g., Scale 0-10; or none, mild,      9/10- low back pain, radiates down leg  7. SIZE: For cuts, bruises, or swelling, ask: How large is it? (e.g., inches or centimeters)      Feels a little swollen  8. PREGNANCY: Is there any chance you are pregnant? When was your last menstrual period?     N/a  9. OTHER SYMPTOMS: Do you have any other symptoms? (e.g., dizziness, fever, weakness; new-onset or worsening).      Abdominal pain/strain when trying to stand up  10. CAUSE: What do you think caused the fall (or falling)? (e.g., dizzy spell, tripped)       Patient says that his knee buckled  Protocols used: Falls and [facility]

## 2023-10-08 NOTE — ED Provider Notes (Signed)
 [place] EMERGENCY DEPARTMENT AT [facility] Provider Note   CSN: [id number] Arrival date & time: 10/08/23  1027     Patient presents with: Fall and Leg Pain   Charles Lloyd. is a 83 y.o. male.    Fall  Leg Pain    Patient has a history of total knee replacement diabetic neuropathy CHF hypertension atrial fibrillation.  Patient also has history of chronic back pain.  He sees an orthopedic doctor and gets injections every few months.  Patient has noted some increasing symptoms of low back pain recently.  Patient was seen in the emergency room on July 27 for increasing back pain.  Patient symptoms were felt to be musculoskeletal in nature.  He was given prescriptions for Lidoderm  and Flexeril .  Patient states last night he was in his garage when he fell.  Patient landed on his tailbone area..  Patient states he was able to get up but this morning he had more severe pain.  He has not had discomfort like this before.  He denies any numbness but the pain in his lower back goes down both legs.  Whenever he stands the pain increases  Prior to Admission medications   Medication Sig Start Date End Date Taking? Authorizing Provider  HYDROcodone -acetaminophen  (NORCO/VICODIN) 5-325 MG tablet Take 1 tablet by mouth every 6 (six) hours as needed. 10/08/23  Yes Randol Simmonds, MD  predniSONE  (DELTASONE ) 10 MG tablet Take 4 tablets (40 mg total) by mouth daily with breakfast for 2 days, THEN 3 tablets (30 mg total) daily with breakfast for 2 days, THEN 2 tablets (20 mg total) daily with breakfast for 2 days, THEN 1 tablet (10 mg total) daily with breakfast for 2 days. 10/08/23 10/16/23 Yes Randol Simmonds, MD  ACCU-CHEK AVIVA PLUS test strip 1 EACH BY OTHER ROUTE DAILY. DX E11.9 Patient taking differently: 2 each by Other route daily. Dx E11.9 08/28/21   Theophilus Andrews, Tully GRADE, MD  albuterol  (VENTOLIN  HFA) [phone] Base) MCG/ACT inhaler Inhale 1-2 puffs into the lungs every 6 (six) hours as needed  for wheezing or shortness of breath. 10/05/20   Parrett, Madelin GORMAN, NP  allopurinol  (ZYLOPRIM ) 300 MG tablet TAKE 1 TABLET BY MOUTH EVERY DAY 07/18/23   Theophilus Andrews, Tully GRADE, MD  apixaban  (ELIQUIS ) 5 MG TABS tablet Take 1 tablet (5 mg total) by mouth 2 (two) times daily. 08/02/23   Theophilus Andrews, Tully GRADE, MD  atorvastatin  (LIPITOR) 20 MG tablet TAKE 1 TABLET (20 MG TOTAL) BY MOUTH DAILY. TAKE 1 TABLET DAILY (PLEASE SCHEDULE A PHYSICAL FOR MORE REFILLS) 09/03/23   Theophilus Andrews, Tully GRADE, MD  carvedilol  (COREG ) 3.125 MG tablet TAKE 1 TABLET BY MOUTH TWICE A DAY 10/03/23   Theophilus Andrews, Tully GRADE, MD  cephALEXin  (KEFLEX ) 500 MG capsule Take 1 capsule (500 mg total) by mouth 3 (three) times daily. 08/23/23   Hoy Nidia FALCON, PA-C  colchicine  0.6 MG tablet Take 1 tablet (0.6 mg total) by mouth 2 (two) times daily. 01/08/22   Theophilus Andrews, Tully GRADE, MD  Continuous Blood Gluc Receiver (FREESTYLE LIBRE 2 READER) DEVI 1 each by Does not apply route daily. 08/28/21   Theophilus Andrews, Tully GRADE, MD  Continuous Blood Gluc Sensor (FREESTYLE LIBRE 2 SENSOR) MISC 1 each by Does not apply route daily. 08/28/21   Theophilus Andrews, Tully GRADE, MD  cyclobenzaprine  (FLEXERIL ) 10 MG tablet Take 0.5 tablets (5 mg total) by mouth 2 (two) times daily as needed for muscle spasms.  09/22/23   Simon Lavonia SAILOR, MD  doxycycline  (VIBRA -TABS) 100 MG tablet TAKE 1 TABLET BY MOUTH TWICE A DAY 09/23/23   Vernetta Lonni GRADE, MD  flecainide  (TAMBOCOR ) 100 MG tablet TAKE 1 TABLET BY MOUTH TWICE A DAY 01/15/23   Camnitz, Soyla Lunger, MD  fluticasone  (FLONASE ) 50 MCG/ACT nasal spray Place 2 sprays into both nostrils daily. 01/07/19   Rosario Leatrice FERNS, MD  fluticasone  furoate-vilanterol (BREO ELLIPTA ) 100-25 MCG/INH AEPB Inhale 1 puff into the lungs daily. 10/05/20   Parrett, Madelin RAMAN, NP  gabapentin  (NEURONTIN ) 300 MG capsule TAKE ONE TABLET IN THE MORNING, ONE TABLET IN THE AFTERNOON, AND 2 TABLETS AT BEDTIME 09/09/23   Theophilus Andrews, Tully GRADE, MD  hydrALAZINE  (APRESOLINE ) 25 MG tablet TAKE 1 TABLET (25 MG TOTAL) BY MOUTH 4 (FOUR) TIMES DAILY. 07/23/23   Krasowski, Robert J, MD  insulin  glargine (LANTUS  SOLOSTAR) 100 UNIT/ML Solostar Pen Inject 8 Units into the skin daily. 09/29/21   Burchette, Wolm ORN, MD  irbesartan  (AVAPRO ) 150 MG tablet Take 1 tablet (150 mg total) by mouth daily. 12/10/22   Camnitz, Soyla Lunger, MD  lidocaine  (LIDODERM ) 5 % Place 1 patch onto the skin daily. Remove & Discard patch within 12 hours or as directed by MD 09/22/23   Simon Lavonia SAILOR, MD  meloxicam  (MOBIC ) 7.5 MG tablet TAKE 1 TABLET BY MOUTH EVERY DAY 08/13/23   Theophilus Andrews, Tully GRADE, MD  montelukast  (SINGULAIR ) 10 MG tablet Take 10 mg by mouth at bedtime.     [provider]  potassium chloride  SA (KLOR-CON  M20) 20 MEQ tablet Take 1 tablet (20 mEq total) by mouth daily. 09/18/23   Theophilus Andrews, Tully GRADE, MD  RESTASIS  0.05 % ophthalmic emulsion Place 1 drop into both eyes 2 (two) times daily as needed (dry eyes). 12/09/19   [provider]  torsemide  (DEMADEX ) 20 MG tablet Take 1 tablet (20 mg total) by mouth 2 (two) times daily. 05/03/23   Krasowski, Robert J, MD    Allergies: Amlodipine     Review of Systems  Updated Vital Signs BP (!) 158/82   Pulse 70   Temp 97.7 F (36.5 C)   Resp 16   SpO2 95%   Physical Exam Vitals and nursing note reviewed.  Constitutional:      General: He is not in acute distress.    Appearance: He is well-developed.  HENT:     Head: Normocephalic and atraumatic.     Right Ear: External ear normal.     Left Ear: External ear normal.  Eyes:     General: No scleral icterus.       Right eye: No discharge.        Left eye: No discharge.     Conjunctiva/sclera: Conjunctivae normal.  Neck:     Trachea: No tracheal deviation.  Cardiovascular:     Rate and Rhythm: Normal rate.  Pulmonary:     Effort: Pulmonary effort is normal. No respiratory distress.     Breath sounds: No  stridor.  Abdominal:     General: There is no distension.  Musculoskeletal:        General: No swelling or deformity.     Cervical back: Neck supple.     Comments: Tenderness palpation lumbar sacral region, no swelling, no tenderness in the bilateral upper or lower legs, no gross deformity, mild edema noted bilateral lower extremities  Skin:    General: Skin is warm and dry.     Findings: No rash.  Neurological:     Mental Status: He is alert. Mental status is at baseline.     Cranial Nerves: No dysarthria or facial asymmetry.     Motor: No seizure activity.     Comments: 5 out of 5 plantar dorsiflexion strength, sensation intact     (all labs ordered are listed, but only abnormal results are displayed) Labs Reviewed  CBC - Abnormal; Notable for the following components:      Result Value   RBC 3.44 (*)    Hemoglobin 10.7 (*)    HCT 31.8 (*)    All other components within normal limits  BASIC METABOLIC PANEL WITH GFR - Abnormal; Notable for the following components:   Sodium 133 (*)    Glucose, Bld 115 (*)    All other components within normal limits    EKG: None  Radiology: CT PELVIS WO CONTRAST Result Date: 10/08/2023 CLINICAL DATA:  Fall.  Bilateral leg pain. EXAM: CT PELVIS WITHOUT CONTRAST TECHNIQUE: Multidetector CT imaging of the pelvis was performed following the standard protocol without intravenous contrast. RADIATION DOSE REDUCTION: This exam was performed according to the departmental dose-optimization program which includes automated exposure control, adjustment of the mA and/or kV according to patient size and/or use of iterative reconstruction technique. COMPARISON:  CT abdomen/pelvis dated 11/16/2021. FINDINGS: Urinary Tract:  No abnormality visualized. Bowel:  Colonic diverticulosis. Vascular/Lymphatic: Aortoiliac atherosclerotic calcification. No enlarged lymph nodes identified in the field of view. 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  Other:  Small fat containing right inguinal hernia. Musculoskeletal: No acute fracture or dislocation involving the innominate bones or visualized bilateral proximal femora. Moderate osteoarthritis of the bilateral hips with joint space narrowing and osteophytosis. Sacroiliac joints and pubic symphysis are anatomically aligned with degenerative changes. Bilateral greater trochanteric and ischial tuberosity enthesopathy. Please refer to the same-day CT of the lumbar spine for description of findings in the lumbar spine. IMPRESSION: 1. No acute osseous abnormality involving the innominate bones or visualized bilateral proximal femora. 2. Moderate osteoarthritis of the bilateral hips. 3. Colonic diverticulosis. 4.  Aortic Atherosclerosis (ICD10-I70.0). Electronically Signed   By: Harrietta Sherry M.D.   On: 10/08/2023 12:20   CT Lumbar Spine Wo Contrast Result Date: 10/08/2023 CLINICAL DATA:  83 year old male with fall last night, bilateral leg pain and weakness. EXAM: CT LUMBAR SPINE WITHOUT CONTRAST TECHNIQUE: Multidetector CT imaging of the lumbar spine was performed without intravenous contrast administration. Multiplanar CT image reconstructions were also generated. RADIATION DOSE REDUCTION: This exam was performed according to the departmental dose-optimization program which includes automated exposure control, adjustment of the mA and/or kV according to patient size and/or use of iterative reconstruction technique. COMPARISON:  Lumbar MRI 08/04/2021. FINDINGS: Segmentation: Transitional anatomy, with partially lumbarized S1 level, nearly full size S1-S2 disc, when designating the lowest ribs at T12. This is the same numbering system used on the 2023 MRI. Correlation with radiographs is recommended prior to any operative intervention. Alignment: Chronic grade 1 anterolisthesis of L4 on L5, about 5 mm now and not significantly changed since 2023. Superimposed chronic degenerative retrolisthesis of L2 on L3 also  appears stable. Similar retrolisthesis of L5 on S1 appears mildly progressed. No significant lumbar scoliosis. Vertebrae: Maintained vertebral height. Widespread chronic degenerative endplate spurring and endplate Schmorl's nodes. Chronic and benign appearing sclerosis of the right L4 anterior inferior vertebral body. Intact visible sacrum and SI joints. No acute osseous abnormality identified. Paraspinal and other soft tissues: Aortoiliac calcified atherosclerosis. Normal caliber abdominal aorta. Diverticulosis of the  visible large bowel. Cholecystectomy. Negative visible other noncontrast abdominal viscera. Lumbar paraspinal soft tissues are within normal limits. Disc levels: Diffuse severe lumbar spine disc and endplate degeneration, with diffuse vacuum disc. Severe disc space loss at all lumbar levels except L4-L5. Chronic multifactorial lumbar spinal stenosis is severe at L2-L3 (series 5, image 35), mild to moderate at L3-L4, very severe at L4-L5 (series 5, image 61), and appears similar to the 2023 MRI. Associated widespread bilateral chronic degenerative lumbar neural foraminal stenosis. IMPRESSION: 1. No acute traumatic injury identified in the Lumbar spine. 2. 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 Correlation with radiographs is recommended prior to any operative intervention. 3. 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 4.  Aortic Atherosclerosis (ICD10-I70.0). Electronically Signed   By: VEAR Hurst M.D.   On: 10/08/2023 11:40     Procedures   Medications Ordered in the ED  morphine  (PF) 4 MG/ML injection 4 mg (4 mg Intravenous Given 10/08/23 1110)  ondansetron  (ZOFRAN ) injection 4 mg (4 mg Intravenous Given 10/08/23 1108)    Clinical Course as of 10/08/23 1253  Tue Oct 08, 2023  1101 Prior MRI in June 2023 shows severe spinal stenosis at L4-L5.  Patient also  had severe spinal stenosis and neuroforaminal stenosis at L2 and L3 there was moderate spinal stenosis at L3-L4 [JK]  1226 Basic metabolic panel(!) Metabolic panel shows slight decrease in sodium doubt clinically significant. [JK]  1226 CBC(!) Anemia noted but hemoglobin stable compared to previous [JK]  1226 CT scan of the pelvis and lumbar spine does not show any acute abnormality [JK]    Clinical Course User Index [JK] Randol Simmonds, MD                                 Medical Decision Making Problems Addressed: Spinal stenosis, unspecified spinal region: chronic illness or injury with exacerbation, progression, or side effects of treatment  Amount and/or Complexity of Data Reviewed Labs: ordered. Decision-making details documented in ED Course. Radiology: ordered and independent interpretation performed.  Risk Prescription drug management. Parenteral controlled substances.   Patient presented with complaints of increasing back pain.  Patient also did fall last night as a result of his back pain.  X-rays are not showing any signs of fracture.  It does show severe spinal stenosis.  Patient does have known spinal stenosis but it sounds like the symptoms are getting more severe now.  He does not have any acute neurologic deficit.  Patient was given IV pain medications.  Will discharge home with steroids and a short course of opiate pain medications.  Encourage patient follow-up with his spine doctor for further treatment     Final diagnoses:  Spinal stenosis, unspecified spinal region    ED Discharge Orders          Ordered    HYDROcodone -acetaminophen  (NORCO/VICODIN) 5-325 MG tablet  Every 6 hours PRN        10/08/23 1252    predniSONE  (DELTASONE ) 10 MG tablet  Q breakfast        10/08/23 1252               Randol Simmonds, MD 10/08/23 1254

## 2023-10-08 NOTE — Telephone Encounter (Signed)
 Pt is at ED

## 2023-10-08 NOTE — ED Triage Notes (Addendum)
 Patient reports falling last night in his garage onto concrete due to bilateral leg pain. The pain causes him to fell as he cannot bear weight. He states this has happened before. He denies hitting his head. -LOC  Bilateral leg swelling noted with pitting.

## 2023-10-08 NOTE — Discharge Instructions (Signed)
 You can take the hydrocodone  for more severe pain.  You can cause side effects of constipation so you might want to consider taking an over-the-counter stool softener.  The steroids as well to see if that helps alleviate your back and leg pain.  Follow-up with your spine doctor as we discussed for further treatment of your spinal stenosis.

## 2023-10-09 ENCOUNTER — Telehealth: Payer: Self-pay

## 2023-10-09 NOTE — Transitions of Care (Post Inpatient/ED Visit) (Signed)
   10/09/2023  Name: Charles Lloyd Juanito Mickey. MRN: [id number] DOB: May 21, 1940  Today's TOC FU Call Status: Today's TOC FU Call Status:: Unsuccessful Call (1st Attempt) Unsuccessful Call (1st Attempt) Date: 10/09/23  Attempted to reach the patient regarding the most recent Inpatient/ED visit.  Follow Up Plan: Additional outreach attempts will be made to reach the patient to complete the Transitions of Care (Post Inpatient/ED visit) call.   Signature [employer]

## 2023-10-13 ENCOUNTER — Other Ambulatory Visit: Payer: Self-pay | Admitting: Internal Medicine

## 2023-10-13 DIAGNOSIS — M1A0411 Idiopathic chronic gout, right hand, with tophus (tophi): Secondary | ICD-10-CM

## 2023-10-15 ENCOUNTER — Encounter: Payer: Self-pay | Admitting: Internal Medicine

## 2023-10-15 ENCOUNTER — Ambulatory Visit: Admitting: Internal Medicine

## 2023-10-15 VITALS — BP 162/84 | HR 62 | Temp 97.7°F | Wt 177.7 lb

## 2023-10-15 DIAGNOSIS — M4800 Spinal stenosis, site unspecified: Secondary | ICD-10-CM

## 2023-10-15 DIAGNOSIS — R296 Repeated falls: Secondary | ICD-10-CM

## 2023-10-15 DIAGNOSIS — Z09 Encounter for follow-up examination after completed treatment for conditions other than malignant neoplasm: Secondary | ICD-10-CM

## 2023-10-15 MED ORDER — HYDROCODONE-ACETAMINOPHEN 5-325 MG PO TABS: 1.0000 | ORAL_TABLET | Freq: Four times a day (QID) | ORAL | 0 refills | Status: DC | PRN

## 2023-10-15 NOTE — Progress Notes (Signed)
 Established Patient Office Visit     CC/Reason for Visit: ED follow-up  HPI: Charles Lloyd. is a 83 y.o. male who is coming in today for the above mentioned reasons.  He was seen in the emergency department on August 12 after a fall that resulted in worsening of his back pain.  X-rays were negative for acute fracture but did show his chronic spinal stenosis.  He is scheduled for his 60-month epidural injection in September.  He was given hydrocodone  in the ED that was helpful and he is requesting refill of this.  He is only taking it once a day in the morning.  He has not noted some constipation since starting the opiate.   Past Medical/Surgical History: Past Medical History:  Diagnosis Date   Acute tracheobronchitis 01/05/2019   B12 deficiency    Basal cell carcinoma (BCC) of left side of nose 01/28/2018   Cancer (HCC)    Cardiomyopathy (HCC) 03/10/2018   With chronic atrial fibrillation   CHF (congestive heart failure) (HCC) 04/16/2013   Functional class II, ejection fraction 35-40%  Formatting of this note might be different from the original. Functional class II, ejection fraction 35-40%   Chronic anticoagulation    Chronic diastolic (congestive) heart failure (HCC) 04/16/2013   Functional class II, ejection fraction 35-40%  Last Assessment & Plan:  Clinically stable Volume well controlled meds reviewed   Chronic diastolic CHF (congestive heart failure) (HCC) 04/16/2013   Functional class II, ejection fraction 35-40%  Last Assessment & Plan:  Clinically stable Volume well controlled meds reviewed   Colon polyps 04/16/2013   Coronary artery disease involving native coronary artery of native heart without angina pectoris 04/05/2022   Myoview  05/25/2019: EF 60, normal perfusion, low risk CCTA (AF protocol) 04/25/20: CAC score 4242 (99th percentile)   Diabetic neuropathy (HCC)    Eosinophil count raised 02/17/2019   Essential hypertension 01/06/2010   Last Assessment &  Plan:  Well controlled Continue med management   Gout    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  Steroid usage was discontinued several months ago.  Follow up with Dermatology as planned.   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   Last Assessment & Plan:  Id following ?ongoing abx managementy reported Request records   Infection of prosthetic right knee joint (HCC)    Insomnia 03/04/2012   Grief with loss of wife 02/20/12  Last Assessment & Plan:  Improved sx  contineu xanax prn Se discussed   Lesion of liver 04/20/2019   -On cardiac CT 02/2019   Mild reactive airways disease    Neoplasm of prostate 04/16/2013   Neoplasm of prostate, 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 Transthoracic Echocardiogram-03/10/2013-[facility]: Normal left ventricular wall thickness and cavity size.  Global left ventricular systolic function is moderately reduced.  The  estimated ejection fraction is 35-40%.  The left atrium is moderately enlarged.  The right atrium is mildly enlarged.  No significant valvular    Overweight (BMI 25.0-29.9) 10/22/2016   Persistent atrial fibrillation (HCC) 04/16/2013   Last Assessment & Plan:  Rate controlled Continue med management eliquis  for stroke preventino  Formatting of this note might be  different from the original.  Drug  HX Current Rx Pre-ABL inefficacy Pre-ABL intolerant Post-ABL inefficacy Post-ABL intolerant max dose/24h M/Y end comments  sotalol                  dofetilide                  flecainide                   propafenone                  am   S/P TKR (total knee replacement), bilateral    Secondary hypercoagulable state (HCC) 04/09/2019   Tinea cruris 04/16/2013   Type 2 diabetes mellitus with diabetic polyneuropathy, without long-term current use of insulin  (HCC) 04/16/2013   Last Assessment & Plan:  Labs today Pt reports well controlled on ambulatory monitoring   Vitamin D  deficiency 07/25/2018    Past Surgical History:  Procedure Laterality Date   ATRIAL FIBRILLATION ABLATION N/A 03/12/2019   Procedure: ATRIAL FIBRILLATION ABLATION;  Surgeon: Inocencio Soyla Lunger, MD;  Location: [facility] INVASIVE CV LAB;  Service: Cardiovascular;  Laterality: N/A;   ATRIAL FIBRILLATION ABLATION N/A 04/29/2020   Procedure: ATRIAL FIBRILLATION ABLATION;  Surgeon: Inocencio Soyla Lunger, MD;  Location: [facility] INVASIVE CV LAB;  Service: Cardiovascular;  Laterality: N/A;   CARDIAC ELECTROPHYSIOLOGY STUDY AND ABLATION     CARDIOVERSION     CARDIOVERSION N/A 10/10/2018   Procedure: CARDIOVERSION;  Surgeon: Delford Maude BROCKS, MD;  Location: [facility] ENDOSCOPY;  Service: Cardiovascular;  Laterality: N/A;   CARDIOVERSION N/A 05/18/2020   Procedure: CARDIOVERSION;  Surgeon: Alveta Aleene PARAS, MD;  Location: [facility] ENDOSCOPY;  Service: Cardiovascular;  Laterality: N/A;   CARDIOVERSION N/A 12/09/2020   Procedure: CARDIOVERSION;  Surgeon: Pietro Redell RAMAN, MD;  Location: [facility] ENDOSCOPY;  Service: Cardiovascular;  Laterality: N/A;   CHOLECYSTECTOMY     PROSTATECTOMY     REPLACEMENT TOTAL KNEE BILATERAL      Social History:  reports that he has never smoked. He has never been exposed to tobacco smoke. He has never used smokeless tobacco. He reports current alcohol use of about 14.0 standard drinks of alcohol per  week. He reports that he does not use drugs.  Allergies: Allergies  Allergen Reactions   Amlodipine  Other (See Comments)    Severe edema, chest pain, nausea     Family History:  Family History  Problem Relation Age of Onset   Cancer Mother    Depression Mother    Early death Mother    Cancer Father    Depression Father    Early death Father      Current Outpatient Medications:    ACCU-CHEK AVIVA PLUS test strip, 1 EACH BY OTHER ROUTE DAILY. DX E11.9 (Patient taking differently: 2 each by Other route daily. Dx E11.9), Disp: 100 strip, Rfl: 12   albuterol  (VENTOLIN  HFA) 108 (90 Base) MCG/ACT inhaler, Inhale 1-2 puffs into the lungs every 6 (six) hours as needed for wheezing or shortness of breath., Disp: 8 g, Rfl: 2   allopurinol  (ZYLOPRIM ) 300 MG tablet, TAKE 1 TABLET BY MOUTH EVERY DAY, Disp:  90 tablet, Rfl: 0   apixaban  (ELIQUIS ) 5 MG TABS tablet, Take 1 tablet (5 mg total) by mouth 2 (two) times daily., Disp: 180 tablet, Rfl: 1   atorvastatin  (LIPITOR) 20 MG tablet, TAKE 1 TABLET (20 MG TOTAL) BY MOUTH DAILY. TAKE 1 TABLET DAILY (PLEASE SCHEDULE A PHYSICAL FOR MORE REFILLS), Disp: 90 tablet, Rfl: 0   carvedilol  (COREG ) 3.125 MG tablet, TAKE 1 TABLET BY MOUTH TWICE A DAY, Disp: 180 tablet, Rfl: 0   cephALEXin  (KEFLEX ) 500 MG capsule, Take 1 capsule (500 mg total) by mouth 3 (three) times daily., Disp: 14 capsule, Rfl: 0   colchicine  0.6 MG tablet, Take 1 tablet (0.6 mg total) by mouth 2 (two) times daily., Disp: 60 tablet, Rfl: 2   Continuous Blood Gluc Receiver (FREESTYLE LIBRE 2 READER) DEVI, 1 each by Does not apply route daily., Disp: 1 each, Rfl: 2   Continuous Blood Gluc Sensor (FREESTYLE LIBRE 2 SENSOR) MISC, 1 each by Does not apply route daily., Disp: 2 each, Rfl: 6   cyclobenzaprine  (FLEXERIL ) 10 MG tablet, Take 0.5 tablets (5 mg total) by mouth 2 (two) times daily as needed for muscle spasms., Disp: 10 tablet, Rfl: 0   doxycycline  (VIBRA -TABS) 100 MG tablet, TAKE 1 TABLET  BY MOUTH TWICE A DAY, Disp: 60 tablet, Rfl: 0   flecainide  (TAMBOCOR ) 100 MG tablet, TAKE 1 TABLET BY MOUTH TWICE A DAY, Disp: 180 tablet, Rfl: 3   fluticasone  (FLONASE ) 50 MCG/ACT nasal spray, Place 2 sprays into both nostrils daily., Disp: 16 g, Rfl: 2   fluticasone  furoate-vilanterol (BREO ELLIPTA ) 100-25 MCG/INH AEPB, Inhale 1 puff into the lungs daily., Disp: 60 each, Rfl: 11   gabapentin  (NEURONTIN ) 300 MG capsule, TAKE ONE TABLET IN THE MORNING, ONE TABLET IN THE AFTERNOON, AND 2 TABLETS AT BEDTIME, Disp: 360 capsule, Rfl: 0   hydrALAZINE  (APRESOLINE ) 25 MG tablet, TAKE 1 TABLET (25 MG TOTAL) BY MOUTH 4 (FOUR) TIMES DAILY., Disp: 360 tablet, Rfl: 0   insulin  glargine (LANTUS  SOLOSTAR) 100 UNIT/ML Solostar Pen, Inject 8 Units into the skin daily., Disp: 15 mL, Rfl: 0   irbesartan  (AVAPRO ) 150 MG tablet, Take 1 tablet (150 mg total) by mouth daily., Disp: 30 tablet, Rfl: 6   lidocaine  (LIDODERM ) 5 %, Place 1 patch onto the skin daily. Remove & Discard patch within 12 hours or as directed by MD, Disp: 30 patch, Rfl: 0   meloxicam  (MOBIC ) 7.5 MG tablet, TAKE 1 TABLET BY MOUTH EVERY DAY, Disp: 15 tablet, Rfl: 0   montelukast  (SINGULAIR ) 10 MG tablet, Take 10 mg by mouth at bedtime. , Disp: , Rfl:    potassium chloride  SA (KLOR-CON  M20) 20 MEQ tablet, Take 1 tablet (20 mEq total) by mouth daily., Disp: 90 tablet, Rfl: 3   predniSONE  (DELTASONE ) 10 MG tablet, Take 4 tablets (40 mg total) by mouth daily with breakfast for 2 days, THEN 3 tablets (30 mg total) daily with breakfast for 2 days, THEN 2 tablets (20 mg total) daily with breakfast for 2 days, THEN 1 tablet (10 mg total) daily with breakfast for 2 days., Disp: 20 tablet, Rfl: 0   RESTASIS  0.05 % ophthalmic emulsion, Place 1 drop into both eyes 2 (two) times daily as needed (dry eyes)., Disp: , Rfl:    torsemide  (DEMADEX ) 20 MG tablet, Take 1 tablet (20 mg total) by mouth 2 (two) times daily., Disp: 180 tablet, Rfl: 0   HYDROcodone -acetaminophen   (NORCO/VICODIN) 5-325 MG tablet, Take 1 tablet by mouth every 6 (  six) hours as needed., Disp: 30 tablet, Rfl: 0  Review of Systems:  Negative unless indicated in HPI.   Physical Exam: Vitals:   10/15/23 0946 10/15/23 0952  BP: (!) 140/80 (!) 162/84  Pulse: 62   Temp: 97.7 F (36.5 C)   TempSrc: Oral   SpO2: 99%   Weight: 177 lb 11.2 oz (80.6 kg)     Body mass index is 25.5 kg/m.     Impression and Plan:  Hospital discharge follow-up  Frequent falls  Spinal stenosis, unspecified spinal region -     HYDROcodone -Acetaminophen ; Take 1 tablet by mouth every 6 (six) hours as needed.  Dispense: 30 tablet; Refill: 0   - Agree with hydrocodone  once daily until he can get his next epidural injection. - We have discussed using a walker/rollator that he already has at home. - Not ready to consider ALF placement. - Advised MiraLAX  while on opioid.  Time spent:31 minutes reviewing chart, interviewing and examining patient and formulating plan of care.     Tully Theophilus Andrews, MD [facility] at [facility]

## 2023-10-17 ENCOUNTER — Telehealth: Payer: Self-pay

## 2023-10-17 NOTE — Telephone Encounter (Signed)
 Pt viewed Echo results on My Chart per Dr. Vanetta Shawl note. Routed to PCP.

## 2023-10-19 ENCOUNTER — Other Ambulatory Visit: Payer: Self-pay | Admitting: Cardiology

## 2023-10-23 ENCOUNTER — Other Ambulatory Visit: Payer: Self-pay | Admitting: Cardiology

## 2023-10-30 ENCOUNTER — Emergency Department (HOSPITAL_BASED_OUTPATIENT_CLINIC_OR_DEPARTMENT_OTHER)

## 2023-10-30 ENCOUNTER — Other Ambulatory Visit (HOSPITAL_BASED_OUTPATIENT_CLINIC_OR_DEPARTMENT_OTHER): Payer: Self-pay

## 2023-10-30 ENCOUNTER — Emergency Department (HOSPITAL_BASED_OUTPATIENT_CLINIC_OR_DEPARTMENT_OTHER)
Admission: EM | Admit: 2023-10-30 | Discharge: 2023-10-30 | Disposition: A | Discharge status: Home / Self Care | Attending: Emergency Medicine | Admitting: Emergency Medicine

## 2023-10-30 ENCOUNTER — Other Ambulatory Visit: Payer: Self-pay

## 2023-10-30 DIAGNOSIS — E119 Type 2 diabetes mellitus without complications: Secondary | ICD-10-CM | POA: Insufficient documentation

## 2023-10-30 DIAGNOSIS — M48061 Spinal stenosis, lumbar region without neurogenic claudication: Secondary | ICD-10-CM | POA: Insufficient documentation

## 2023-10-30 DIAGNOSIS — M4726 Other spondylosis with radiculopathy, lumbar region: Secondary | ICD-10-CM | POA: Insufficient documentation

## 2023-10-30 DIAGNOSIS — Z7901 Long term (current) use of anticoagulants: Secondary | ICD-10-CM | POA: Diagnosis not present

## 2023-10-30 DIAGNOSIS — E1140 Type 2 diabetes mellitus with diabetic neuropathy, unspecified: Secondary | ICD-10-CM | POA: Insufficient documentation

## 2023-10-30 DIAGNOSIS — M48062 Spinal stenosis, lumbar region with neurogenic claudication: Secondary | ICD-10-CM

## 2023-10-30 DIAGNOSIS — R519 Headache, unspecified: Secondary | ICD-10-CM | POA: Diagnosis not present

## 2023-10-30 DIAGNOSIS — Z79899 Other long term (current) drug therapy: Secondary | ICD-10-CM | POA: Insufficient documentation

## 2023-10-30 DIAGNOSIS — R296 Repeated falls: Secondary | ICD-10-CM | POA: Insufficient documentation

## 2023-10-30 DIAGNOSIS — W1809XA Striking against other object with subsequent fall, initial encounter: Secondary | ICD-10-CM | POA: Diagnosis not present

## 2023-10-30 DIAGNOSIS — I5030 Unspecified diastolic (congestive) heart failure: Secondary | ICD-10-CM | POA: Insufficient documentation

## 2023-10-30 DIAGNOSIS — M5030 Other cervical disc degeneration, unspecified cervical region: Secondary | ICD-10-CM | POA: Diagnosis not present

## 2023-10-30 DIAGNOSIS — M549 Dorsalgia, unspecified: Secondary | ICD-10-CM | POA: Insufficient documentation

## 2023-10-30 DIAGNOSIS — S3993XA Unspecified injury of pelvis, initial encounter: Secondary | ICD-10-CM | POA: Diagnosis present

## 2023-10-30 DIAGNOSIS — I4819 Other persistent atrial fibrillation: Secondary | ICD-10-CM | POA: Insufficient documentation

## 2023-10-30 DIAGNOSIS — R9082 White matter disease, unspecified: Secondary | ICD-10-CM | POA: Insufficient documentation

## 2023-10-30 DIAGNOSIS — I110 Hypertensive heart disease with heart failure: Secondary | ICD-10-CM | POA: Diagnosis not present

## 2023-10-30 DIAGNOSIS — E785 Hyperlipidemia, unspecified: Secondary | ICD-10-CM | POA: Diagnosis not present

## 2023-10-30 LAB — BASIC METABOLIC PANEL WITH GFR
Anion gap: 11 (ref 5–15)
BUN: 12 mg/dL (ref 8–23)
CO2: 24 mmol/L (ref 22–32)
Calcium: 9.1 mg/dL (ref 8.9–10.3)
Chloride: 102 mmol/L (ref 98–111)
Creatinine, Ser: 0.91 mg/dL (ref 0.61–1.24)
GFR, Estimated: >60 mL/min (ref >60)
Glucose, Bld: 186 mg/dL — ABNORMAL HIGH (ref 70–99)
Potassium: 3.6 mmol/L (ref 3.5–5.1)
Sodium: 137 mmol/L (ref 135–145)

## 2023-10-30 LAB — URINALYSIS, ROUTINE W REFLEX MICROSCOPIC
Bilirubin Urine: NEGATIVE
Glucose, UA: NEGATIVE mg/dL
Hgb urine dipstick: NEGATIVE
Ketones, ur: NEGATIVE mg/dL
Leukocytes,Ua: NEGATIVE
Nitrite: NEGATIVE
Specific Gravity, Urine: 1.02 (ref 1.005–1.030)
pH: 5.5 (ref 5.0–8.0)

## 2023-10-30 LAB — CBC WITH DIFFERENTIAL/PLATELET
Abs Immature Granulocytes: 0.01 K/uL (ref 0.00–0.07)
Basophils Absolute: 0 K/uL (ref 0.0–0.1)
Basophils Relative: 0 %
Eosinophils Absolute: 0.2 K/uL (ref 0.0–0.5)
Eosinophils Relative: 4 %
HCT: 31.8 % — ABNORMAL LOW (ref 39.0–52.0)
Hemoglobin: 10.5 g/dL — ABNORMAL LOW (ref 13.0–17.0)
Immature Granulocytes: 0 %
Lymphocytes Relative: 27 %
Lymphs Abs: 1 K/uL (ref 0.7–4.0)
MCH: 30.1 pg (ref 26.0–34.0)
MCHC: 33 g/dL (ref 30.0–36.0)
MCV: 91.1 fL (ref 80.0–100.0)
Monocytes Absolute: 0.4 K/uL (ref 0.1–1.0)
Monocytes Relative: 12 %
Neutro Abs: 2 K/uL (ref 1.7–7.7)
Neutrophils Relative %: 57 %
Platelets: 175 K/uL (ref 150–400)
RBC: 3.49 MIL/uL — ABNORMAL LOW (ref 4.22–5.81)
RDW: 15 % (ref 11.5–15.5)
WBC: 3.5 K/uL — ABNORMAL LOW (ref 4.0–10.5)
nRBC: 0 % (ref 0.0–0.2)

## 2023-10-30 MED ORDER — METHOCARBAMOL 500 MG PO TABS: 500.0000 mg | ORAL_TABLET | Freq: Two times a day (BID) | ORAL | 0 refills | Status: DC

## 2023-10-30 MED ORDER — METHYLPREDNISOLONE 4 MG PO TBPK: ORAL_TABLET | ORAL | 0 refills | Status: DC

## 2023-10-30 MED ORDER — DICLOFENAC SODIUM 1 % EX GEL: 4.0000 g | Freq: Four times a day (QID) | CUTANEOUS | 0 refills | Status: DC

## 2023-10-30 MED ORDER — TIZANIDINE HCL 2 MG PO TABS
2.0000 mg | ORAL_TABLET | Freq: Once | ORAL | Status: AC
  Administered 2023-10-30: 2 mg via ORAL
  Filled 2023-10-30: qty 1

## 2023-10-30 MED ORDER — LORAZEPAM 1 MG PO TABS
0.5000 mg | ORAL_TABLET | ORAL | Status: AC | PRN
Start: 2023-10-30 — End: 2023-10-30
  Administered 2023-10-30: 0.5 mg via ORAL
  Filled 2023-10-30: qty 1

## 2023-10-30 MED ORDER — DICLOFENAC SODIUM 1 % EX GEL
4.0000 g | Freq: Four times a day (QID) | CUTANEOUS | 0 refills | Status: DC
  Filled 2023-10-30: qty 100, 25d supply, fill #0

## 2023-10-30 MED ORDER — OXYCODONE-ACETAMINOPHEN 5-325 MG PO TABS
1.0000 | ORAL_TABLET | Freq: Once | ORAL | Status: AC
  Administered 2023-10-30: 1 via ORAL
  Filled 2023-10-30: qty 1

## 2023-10-30 MED ORDER — OXYCODONE HCL 5 MG PO TABS
5.0000 mg | ORAL_TABLET | Freq: Once | ORAL | Status: AC
  Administered 2023-10-30: 5 mg via ORAL
  Filled 2023-10-30: qty 1

## 2023-10-30 MED ORDER — KETOROLAC TROMETHAMINE 15 MG/ML IJ SOLN
15.0000 mg | Freq: Once | INTRAMUSCULAR | Status: AC
  Administered 2023-10-30: 15 mg via INTRAVENOUS
  Filled 2023-10-30: qty 1

## 2023-10-30 MED ORDER — ACETAMINOPHEN 325 MG PO TABS
650.0000 mg | ORAL_TABLET | Freq: Once | ORAL | Status: AC
  Administered 2023-10-30: 650 mg via ORAL
  Filled 2023-10-30: qty 2

## 2023-10-30 MED ORDER — METHYLPREDNISOLONE 4 MG PO TBPK
ORAL_TABLET | ORAL | 0 refills | Status: DC
Start: 2023-10-30 — End: 2023-10-30
  Filled 2023-10-30: qty 21, 6d supply, fill #0

## 2023-10-30 MED ORDER — METHOCARBAMOL 500 MG PO TABS
500.0000 mg | ORAL_TABLET | Freq: Two times a day (BID) | ORAL | 0 refills | Status: DC
  Filled 2023-10-30: qty 20, 10d supply, fill #0

## 2023-10-30 NOTE — ED Provider Notes (Signed)
 Received patient in turnover from Dr. Jerrol.  Please see their note for further details of Hx, PE.  Briefly patient is a 83 y.o. male with a Fall .  Bilateral low back pain and weakness, plan for MRI.  MRI unchanged from last exam about 2 years and 3 months ago.  I discussed this with the patient.  He tells me that for the last couple years he has been going every 3 months with the intraspinal injection that he thinks he gets at [name].  He says it works for about 2-1/2 months and that he has a few weeks where he does not do his well.  He thinks he is in that current time.  He hopes he can make it until next Wednesday when he would get his next joint injection.  He does feel comfortable trying to go home.  On my exam pulse motor and sensation intact distally.  Reflexes are mildly diminished bilaterally.  No clonus.   Try Medrol  Dosepak.  Diclofenac  gel.  Muscle relaxant at night.      Emil Share, DO 10/30/23 1626

## 2023-10-30 NOTE — ED Notes (Signed)
 Pt unable to provide UA at this time. Pt is aware a specimen is needed.

## 2023-10-30 NOTE — ED Provider Notes (Addendum)
 [place] EMERGENCY DEPARTMENT AT [facility] Provider Note   CSN: [id number] Arrival date & time: 10/30/23  1021     Patient presents with: Charles Lloyd. is a 83 y.o. male.    Fall     83 year old male with medical history significant for Chronic anticoagulation on Eliquis , HTN, persistent atrial fibrillation, HLD, DM2, diastolic CHF, cardiomyopathy, chronic back pain presenting to the emergency department with back pain after a fall.  The patient states that on Monday he fell and struck his head on a carpeted floor.  He denies any loss of consciousness.  He is on anticoagulation.  He denies loss of consciousness.  He states that he was seen for back pain in early August and was discharged after negative x-ray imaging.  He states that his back pain has lingered somewhat since then.  He states that he has chronic neuropathy and thinks that his fall this time was caused by his neuropathy causing him to lose his balance.  He landed on his buttocks and did strike his head.  He states that he is having pain from his buttocks radiating down his hips bilaterally, worse on the left.  He has had weakness and numbness that is new due to pain.  He denies any incontinence.  No saddle anesthesia.  Prior to Admission medications   Medication Sig Start Date End Date Taking? Authorizing Provider  ACCU-CHEK AVIVA PLUS test strip 1 EACH BY OTHER ROUTE DAILY. DX E11.9 Patient taking differently: 2 each by Other route daily. Dx E11.9 08/28/21   Theophilus Andrews, Tully GRADE, MD  albuterol  (VENTOLIN  HFA) 108 [id number] Base) MCG/ACT inhaler Inhale 1-2 puffs into the lungs every 6 (six) hours as needed for wheezing or shortness of breath. 10/05/20   Parrett, Madelin GORMAN, NP  allopurinol  (ZYLOPRIM ) 300 MG tablet TAKE 1 TABLET BY MOUTH EVERY DAY 10/14/23   Theophilus Andrews, Tully GRADE, MD  apixaban  (ELIQUIS ) 5 MG TABS tablet Take 1 tablet (5 mg total) by mouth 2 (two) times daily. 08/02/23   Theophilus Andrews,  Tully GRADE, MD  atorvastatin  (LIPITOR) 20 MG tablet TAKE 1 TABLET (20 MG TOTAL) BY MOUTH DAILY. TAKE 1 TABLET DAILY (PLEASE SCHEDULE A PHYSICAL FOR MORE REFILLS) 09/03/23   Theophilus Andrews, Tully GRADE, MD  carvedilol  (COREG ) 3.125 MG tablet TAKE 1 TABLET BY MOUTH TWICE A DAY 10/03/23   Theophilus Andrews, Tully GRADE, MD  cephALEXin  (KEFLEX ) 500 MG capsule Take 1 capsule (500 mg total) by mouth 3 (three) times daily. 08/23/23   Hoy Nidia FALCON, PA-C  colchicine  0.6 MG tablet Take 1 tablet (0.6 mg total) by mouth 2 (two) times daily. 01/08/22   Theophilus Andrews, Tully GRADE, MD  Continuous Blood Gluc Receiver (FREESTYLE LIBRE 2 READER) DEVI 1 each by Does not apply route daily. 08/28/21   Theophilus Andrews, Tully GRADE, MD  Continuous Blood Gluc Sensor (FREESTYLE LIBRE 2 SENSOR) MISC 1 each by Does not apply route daily. 08/28/21   Theophilus Andrews, Tully GRADE, MD  cyclobenzaprine  (FLEXERIL ) 10 MG tablet Take 0.5 tablets (5 mg total) by mouth 2 (two) times daily as needed for muscle spasms. 09/22/23   Simon Lavonia SAILOR, MD  doxycycline  (VIBRA -TABS) 100 MG tablet TAKE 1 TABLET BY MOUTH TWICE A DAY 09/23/23   Vernetta Lonni GRADE, MD  flecainide  (TAMBOCOR ) 100 MG tablet TAKE 1 TABLET BY MOUTH TWICE A DAY 10/24/23   Camnitz, Soyla Lunger, MD  fluticasone  (FLONASE ) 50 MCG/ACT nasal spray Place 2 sprays into  both nostrils daily. 01/07/19   Rosario Leatrice FERNS, MD  fluticasone  furoate-vilanterol (BREO ELLIPTA ) 100-25 MCG/INH AEPB Inhale 1 puff into the lungs daily. 10/05/20   Parrett, Madelin RAMAN, NP  gabapentin  (NEURONTIN ) 300 MG capsule TAKE ONE TABLET IN THE MORNING, ONE TABLET IN THE AFTERNOON, AND 2 TABLETS AT BEDTIME 09/09/23   Theophilus Andrews, Tully GRADE, MD  hydrALAZINE  (APRESOLINE ) 25 MG tablet TAKE 1 TABLET (25 MG TOTAL) BY MOUTH 4 (FOUR) TIMES DAILY. 10/22/23   Krasowski, Robert J, MD  HYDROcodone -acetaminophen  (NORCO/VICODIN) 5-325 MG tablet Take 1 tablet by mouth every 6 (six) hours as needed. 10/15/23   Theophilus Andrews, Tully GRADE, MD  insulin  glargine (LANTUS  SOLOSTAR) 100 UNIT/ML Solostar Pen Inject 8 Units into the skin daily. 09/29/21   Burchette, Wolm ORN, MD  irbesartan  (AVAPRO ) 150 MG tablet Take 1 tablet (150 mg total) by mouth daily. 12/10/22   Camnitz, Soyla Lunger, MD  lidocaine  (LIDODERM ) 5 % Place 1 patch onto the skin daily. Remove & Discard patch within 12 hours or as directed by MD 09/22/23   Simon Lavonia SAILOR, MD  meloxicam  (MOBIC ) 7.5 MG tablet TAKE 1 TABLET BY MOUTH EVERY DAY 08/13/23   Theophilus Andrews, Tully GRADE, MD  montelukast  (SINGULAIR ) 10 MG tablet Take 10 mg by mouth at bedtime.     [provider]  potassium chloride  SA (KLOR-CON  M20) 20 MEQ tablet Take 1 tablet (20 mEq total) by mouth daily. 09/18/23   Theophilus Andrews, Tully GRADE, MD  RESTASIS  0.05 % ophthalmic emulsion Place 1 drop into both eyes 2 (two) times daily as needed (dry eyes). 12/09/19   [provider]  torsemide  (DEMADEX ) 20 MG tablet Take 1 tablet (20 mg total) by mouth 2 (two) times daily. 05/03/23   Krasowski, Robert J, MD    Allergies: Amlodipine     Review of Systems  All other systems reviewed and are negative.   Updated Vital Signs BP 139/64 (BP Location: Right Arm)   Pulse 66   Temp 97.9 F (36.6 C) (Oral)   Resp 20   Ht 5' 10 (1.778 m)   Wt 79.4 kg   SpO2 100%   BMI 25.11 kg/m   Physical Exam Vitals and nursing note reviewed.  Constitutional:      Appearance: He is well-developed.     Comments: GCS 15, ABC intact  HENT:     Head: Normocephalic.  Eyes:     Conjunctiva/sclera: Conjunctivae normal.  Neck:     Comments: No midline tenderness to palpation of the cervical spine. ROM intact. Cardiovascular:     Rate and Rhythm: Normal rate and regular rhythm.  Pulmonary:     Effort: Pulmonary effort is normal. No respiratory distress.     Breath sounds: Normal breath sounds.  Chest:     Comments: Chest wall stable and non-tender to AP and lateral compression. Clavicles stable and non-tender to AP  compression Abdominal:     Palpations: Abdomen is soft.     Tenderness: There is no abdominal tenderness.     Comments: Pelvis stable to lateral compression.  Musculoskeletal:     Cervical back: Neck supple.     Comments: No midline tenderness to palpation of the thoracic or lumbar spine. Extremities atraumatic with intact ROM.  Positive straight leg raise test bilaterally  Skin:    General: Skin is warm and dry.  Neurological:     Mental Status: He is alert.     Comments: CN II-XII grossly intact. Moving all four  extremities spontaneously and sensation grossly intact.  3-5 strength in the bilateral lower extremities with intact sensation to light touch     (all labs ordered are listed, but only abnormal results are displayed) Labs Reviewed - No data to display  EKG: None  Radiology: No results found.   Procedures   Medications Ordered in the ED - No data to display                                  Medical Decision Making Amount and/or Complexity of Data Reviewed Labs: ordered. Radiology: ordered.  Risk OTC drugs. Prescription drug management.    84 year old male with medical history significant for Chronic anticoagulation on Eliquis , HTN, persistent atrial fibrillation, HLD, DM2, diastolic CHF, cardiomyopathy, chronic back pain presenting to the emergency department with back pain after a fall.  The patient states that on Monday he fell and struck his head on a carpeted floor.  He denies any loss of consciousness.  He is on anticoagulation.  He denies loss of consciousness.  He states that he was seen for back pain in early August and was discharged after negative x-ray imaging.  He states that his back pain has lingered somewhat since then.  He states that he has chronic neuropathy and thinks that his fall this time was caused by his neuropathy causing him to lose his balance.  He landed on his buttocks and did strike his head.  He states that he is having pain from his  buttocks radiating down his hips bilaterally, worse on the left.  He has had weakness and numbness that is new due to pain.  He denies any incontinence.  No saddle anesthesia.  On arrival, the patient was afebrile, not tachycardic or tachypneic, hemodynamically stable.  Physical exam revealed bilateral lower extremity weakness in the lower extremities, positive straight leg raise test bilaterally.  Presenting with worsening back pain after fall, considered lower spine fracture although no midline tenderness noted, considered sacral fracture, coccyx fracture, considered other pelvic fracture.  In the setting of anticoagulation use, age and minor head trauma, will obtain CT imaging to further evaluate, given patient's pain, will also obtain CT imaging of the lower lumbar spine and pelvis.  Screening labs obtained.  The patient was administered Percocet and tizanidine .  Labs: UA without hematuria or UTI. CBC with a leukopenia to 3.5, mild anemia to 10.5, BMP with hyperglycemia to 186. Hgb at baseline.  CT Head:  IMPRESSION:  1. No acute intracranial abnormality related to the reported head trauma.  2. Age-related cerebral volume loss and mild cerebral white matter disease.  3. Mild mucosal disease within the floors of the maxillary sinuses.   CT Cervical Spine:  IMPRESSION:  1. No acute traumatic injury.  2. 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   CT Lumbar Spine:  IMPRESSION:  1. Severe central spinal canal stenosis and severe bilateral lateral recess  stenosis at L4-5, with likely impingement of the L5 nerves in the lateral  recesses.  2. Mild-to-moderate central spinal canal stenosis and bilateral lateral recess  stenosis at L2-3 and L3-4.  3. Grade 1 degenerative anterolisthesis at L4-5.   CT Pelvis:  IMPRESSION:  1. No acute osseous abnormality.    Patient with severe spinal stenosis and recess stenosis with likely impingement of nerve  roots, given worsening weakness and radicular pain bilaterally, will obtain MRI imaging of  the lumbar spine.  MR Lumbar Spine: pending at signout.  Plan at time of signout to reassess the patient, follow-up results of MRI imaging, ultimate disposition pending results of diagnostic testing and reassessment, patient will need reassessment of pain and ambulatory trial. Signout given to Dr. Emil at [phone].     Final diagnoses:  None    ED Discharge Orders     None          Jerrol Agent, MD 10/30/23 1617    Jerrol Agent, MD 10/30/23 [phone]

## 2023-10-30 NOTE — Discharge Instructions (Addendum)
 You should mention your MRI results to your spine doctor.  Have a chat with them about what you can do to try and make this better.  I have started you on steroids hopefully that will help a bit with your discomfort.  I have prescribed you a topical gel that works like ibuprofen.  You can buy this over-the-counter, it may be cheaper by prescription.  Talk it over with your pharmacist.  I have also prescribed a muscle relaxant medication.  Maybe this will help you sleep at night.  Please return for worsening weakness inability to urinate or if you move your bowels on yourself or you cannot feel your groin.  Please let your family doctor know how you are doing.

## 2023-10-30 NOTE — ED Triage Notes (Addendum)
 Pt arrived POV with son reporting fall 2 days ago d/t loss of balance. Frequent falls recently d/t pain. Pt c/o worsening lower back pain radiating down buttocks and legs bilat, pain worse on L side. No new weakness/numbness since recent fall. Pt states he did hit the back of his head on carpeted floor Monday, denies LOC. Takes Eliquis .

## 2023-11-23 ENCOUNTER — Other Ambulatory Visit (HOSPITAL_BASED_OUTPATIENT_CLINIC_OR_DEPARTMENT_OTHER): Payer: Self-pay

## 2023-11-23 DIAGNOSIS — M48062 Spinal stenosis, lumbar region with neurogenic claudication: Secondary | ICD-10-CM

## 2023-11-25 ENCOUNTER — Telehealth: Payer: Self-pay | Admitting: *Deleted

## 2023-11-25 NOTE — Telephone Encounter (Signed)
 Copied from CRM [id number]. Topic: Clinical - Refused Triage >> Nov 25, 2023  8:02 AM [name] wrote: Reason for Triage: Patient stated he is still in a great amount of pain along his waistline, making it difficult to sleep. Originally called in to make an ED follow up, but stated pain is worsening. Refused to speak with nurse triage. Callback number [phone].

## 2023-11-25 NOTE — Telephone Encounter (Signed)
 Spoke with patient and an appointment was scheduled for 11/26/23 with Dr Theophilus.

## 2023-11-26 ENCOUNTER — Encounter: Payer: Self-pay | Admitting: Internal Medicine

## 2023-11-26 ENCOUNTER — Ambulatory Visit: Admitting: Internal Medicine

## 2023-11-26 VITALS — BP 149/82 | HR 71 | Temp 98.1°F | Wt 174.4 lb

## 2023-11-26 DIAGNOSIS — Z23 Encounter for immunization: Secondary | ICD-10-CM

## 2023-11-26 DIAGNOSIS — M7918 Myalgia, other site: Secondary | ICD-10-CM | POA: Diagnosis not present

## 2023-11-26 MED ORDER — HYDROCODONE-ACETAMINOPHEN 5-325 MG PO TABS: 1.0000 | ORAL_TABLET | Freq: Four times a day (QID) | ORAL | 0 refills | Status: DC | PRN

## 2023-11-26 NOTE — Progress Notes (Signed)
 Established Patient Office Visit     CC/Reason for Visit: Buttock pain  HPI: Charles Lloyd. is a 83 y.o. male who is coming in today for the above mentioned reasons. Past Medical History is significant for: Chronic spinal stenosis.  Has had increase in falls lately presumably related to his peripheral neuropathy and spinal stenosis.  His last 1 he fell flat on his bottom and was sitting on the concrete for a while before his son was able to pick him up.  He has seen neurosurgery, has had an epidural injection that has completely alleviated his back pain.  He gets them about 3-4 times a year.  However his buttock remains very painful.  It increases with even minimal movement to the point where he has not slept any in the last 4 to 5 days.  He has tried muscle relaxers, NSAIDs both over-the-counter and meloxicam  without relief.  He has recently had CT scans and MRIs and was told by neurosurgery that it is unlikely that his buttock pain is related to his spinal stenosis.  He is requesting pain management and a flu vaccine.   Past Medical/Surgical History: Past Medical History:  Diagnosis Date   Acute tracheobronchitis 01/05/2019   B12 deficiency    Basal cell carcinoma (BCC) of left side of nose 01/28/2018   Cancer (HCC)    Cardiomyopathy (HCC) 03/10/2018   With chronic atrial fibrillation   CHF (congestive heart failure) (HCC) 04/16/2013   Functional class II, ejection fraction 35-40%  Formatting of this note might be different from the original. Functional class II, ejection fraction 35-40%   Chronic anticoagulation    Chronic diastolic (congestive) heart failure (HCC) 04/16/2013   Functional class II, ejection fraction 35-40%  Last Assessment & Plan:  Clinically stable Volume well controlled meds reviewed   Chronic diastolic CHF (congestive heart failure) (HCC) 04/16/2013   Functional class II, ejection fraction 35-40%  Last Assessment & Plan:  Clinically stable Volume well  controlled meds reviewed   Colon polyps 04/16/2013   Coronary artery disease involving native coronary artery of native heart without angina pectoris 04/05/2022   Myoview  05/25/2019: EF 60, normal perfusion, low risk CCTA (AF protocol) 04/25/20: CAC score 4242 (99th percentile)   Diabetic neuropathy (HCC)    Eosinophil count raised 02/17/2019   Essential hypertension 01/06/2010   Last Assessment & Plan:  Well controlled Continue med management   Gout    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  Steroid usage was discontinued several months ago.  Follow up with Dermatology as planned.   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   Last Assessment & Plan:  Id following ?ongoing abx managementy reported Request records   Infection of prosthetic right knee joint    Insomnia 03/04/2012   Grief with loss of wife 02/20/12  Last Assessment & Plan:  Improved sx  contineu xanax prn Se discussed   Lesion of liver 04/20/2019   -On cardiac CT 02/2019   Mild reactive airways disease    Neoplasm of prostate 04/16/2013   Neoplasm of prostate, malignant (HCC) 01/06/2010   Erle S/p radiation therapy  Last Assessment & Plan:  Repeat psa today No urinary sx Pt reported fatigue/poor appetite   On amiodarone therapy 09/07/2013   On continuous oral anticoagulation 01/28/2018   Other activity(E029.9) 04/20/2013   Formatting of this note might be different from the original. Transthoracic Echocardiogram-03/10/2013-[facility]: Normal left ventricular wall thickness and cavity size.  Global left ventricular systolic function is  moderately reduced.  The estimated ejection fraction is 35-40%.  The left atrium is moderately enlarged.  The right atrium is mildly enlarged.  No significant valvular    Overweight (BMI 25.0-29.9) 10/22/2016   Persistent atrial fibrillation (HCC) 04/16/2013   Last Assessment & Plan:  Rate controlled Continue med management eliquis  for stroke preventino  Formatting of this note might be different from the original.  Drug  HX Current Rx Pre-ABL inefficacy Pre-ABL intolerant Post-ABL inefficacy Post-ABL intolerant max dose/24h M/Y end comments  sotalol                  dofetilide                  flecainide                   propafenone                  am   S/P TKR (total knee replacement), bilateral    Secondary hypercoagulable state 04/09/2019   Tinea cruris 04/16/2013   Type 2 diabetes mellitus with diabetic polyneuropathy, without long-term current use of insulin  (HCC) 04/16/2013   Last Assessment & Plan:  Labs today Pt reports well controlled on ambulatory monitoring   Vitamin D  deficiency 07/25/2018    Past Surgical History:  Procedure Laterality Date   ATRIAL FIBRILLATION ABLATION N/A 03/12/2019   Procedure: ATRIAL FIBRILLATION ABLATION;  Surgeon: Inocencio Soyla Lunger, MD;  Location: [facility] INVASIVE CV [facility];  Service: Cardiovascular;  Laterality: N/A;   ATRIAL FIBRILLATION ABLATION N/A 04/29/2020   Procedure: ATRIAL FIBRILLATION ABLATION;  Surgeon: Inocencio Soyla Lunger, MD;  Location: [facility] INVASIVE CV [facility];  Service: Cardiovascular;  Laterality: N/A;   CARDIAC ELECTROPHYSIOLOGY STUDY AND ABLATION     CARDIOVERSION     CARDIOVERSION N/A 10/10/2018   Procedure: CARDIOVERSION;  Surgeon: Delford Maude BROCKS, MD;  Location: [facility] ENDOSCOPY;  Service: Cardiovascular;  Laterality: N/A;   CARDIOVERSION N/A 05/18/2020   Procedure: CARDIOVERSION;  Surgeon: Alveta Aleene PARAS, MD;  Location: [facility] ENDOSCOPY;  Service: Cardiovascular;  Laterality: N/A;   CARDIOVERSION N/A 12/09/2020   Procedure: CARDIOVERSION;  Surgeon:  Pietro Redell RAMAN, MD;  Location: [facility] ENDOSCOPY;  Service: Cardiovascular;  Laterality: N/A;   CHOLECYSTECTOMY     PROSTATECTOMY     REPLACEMENT TOTAL KNEE BILATERAL      Social History:  reports that he has never smoked. He has never been exposed to tobacco smoke. He has never used smokeless tobacco. He reports current alcohol use of about 14.0 standard drinks of alcohol per week. He reports that he does not use drugs.  Allergies: Allergies  Allergen Reactions   Amlodipine  Other (See Comments)    Severe edema, chest pain, nausea     Family History:  Family History  Problem Relation Age of Onset   Cancer Mother    Depression Mother    Early death Mother    Cancer Father    Depression Father    Early death Father      Current Outpatient Medications:    ACCU-CHEK AVIVA PLUS test strip, 1 EACH BY OTHER  ROUTE DAILY. DX E11.9 (Patient taking differently: 2 each by Other route daily. Dx E11.9), Disp: 100 strip, Rfl: 12   albuterol  (VENTOLIN  HFA) 108 (90 Base) MCG/ACT inhaler, Inhale 1-2 puffs into the lungs every 6 (six) hours as needed for wheezing or shortness of breath., Disp: 8 g, Rfl: 2   allopurinol  (ZYLOPRIM ) 300 MG tablet, TAKE 1 TABLET BY MOUTH EVERY DAY, Disp: 90 tablet, Rfl: 0   apixaban  (ELIQUIS ) 5 MG TABS tablet, Take 1 tablet (5 mg total) by mouth 2 (two) times daily., Disp: 180 tablet, Rfl: 1   atorvastatin  (LIPITOR) 20 MG tablet, TAKE 1 TABLET (20 MG TOTAL) BY MOUTH DAILY. TAKE 1 TABLET DAILY (PLEASE SCHEDULE A PHYSICAL FOR MORE REFILLS), Disp: 90 tablet, Rfl: 0   carvedilol  (COREG ) 3.125 MG tablet, TAKE 1 TABLET BY MOUTH TWICE A DAY, Disp: 180 tablet, Rfl: 0   cephALEXin  (KEFLEX ) 500 MG capsule, Take 1 capsule (500 mg total) by mouth 3 (three) times daily., Disp: 14 capsule, Rfl: 0   colchicine  0.6 MG tablet, Take 1 tablet (0.6 mg total) by mouth 2 (two) times daily., Disp: 60 tablet, Rfl: 2   Continuous Blood Gluc Receiver (FREESTYLE LIBRE 2 READER) DEVI, 1 each by  Does not apply route daily., Disp: 1 each, Rfl: 2   Continuous Blood Gluc Sensor (FREESTYLE LIBRE 2 SENSOR) MISC, 1 each by Does not apply route daily., Disp: 2 each, Rfl: 6   cyclobenzaprine  (FLEXERIL ) 10 MG tablet, Take 0.5 tablets (5 mg total) by mouth 2 (two) times daily as needed for muscle spasms., Disp: 10 tablet, Rfl: 0   diclofenac  Sodium (VOLTAREN ) 1 % GEL, Apply 4 g topically 4 (four) times daily., Disp: 100 g, Rfl: 0   doxycycline  (VIBRA -TABS) 100 MG tablet, TAKE 1 TABLET BY MOUTH TWICE A DAY, Disp: 60 tablet, Rfl: 0   flecainide  (TAMBOCOR ) 100 MG tablet, TAKE 1 TABLET BY MOUTH TWICE A DAY, Disp: 180 tablet, Rfl: 3   fluticasone  (FLONASE ) 50 MCG/ACT nasal spray, Place 2 sprays into both nostrils daily., Disp: 16 g, Rfl: 2   fluticasone  furoate-vilanterol (BREO ELLIPTA ) 100-25 MCG/INH AEPB, Inhale 1 puff into the lungs daily., Disp: 60 each, Rfl: 11   gabapentin  (NEURONTIN ) 300 MG capsule, TAKE ONE TABLET IN THE MORNING, ONE TABLET IN THE AFTERNOON, AND 2 TABLETS AT BEDTIME, Disp: 360 capsule, Rfl: 0   hydrALAZINE  (APRESOLINE ) 25 MG tablet, TAKE 1 TABLET (25 MG TOTAL) BY MOUTH 4 (FOUR) TIMES DAILY., Disp: 360 tablet, Rfl: 3   HYDROcodone -acetaminophen  (NORCO/VICODIN) 5-325 MG tablet, Take 1-2 tablets by mouth every 6 (six) hours as needed for moderate pain (pain score 4-6)., Disp: 90 tablet, Rfl: 0   insulin  glargine (LANTUS  SOLOSTAR) 100 UNIT/ML Solostar Pen, Inject 8 Units into the skin daily., Disp: 15 mL, Rfl: 0   irbesartan  (AVAPRO ) 150 MG tablet, Take 1 tablet (150 mg total) by mouth daily., Disp: 30 tablet, Rfl: 6   lidocaine  (LIDODERM ) 5 %, Place 1 patch onto the skin daily. Remove & Discard patch within 12 hours or as directed by MD, Disp: 30 patch, Rfl: 0   meloxicam  (MOBIC ) 7.5 MG tablet, TAKE 1 TABLET BY MOUTH EVERY DAY, Disp: 15 tablet, Rfl: 0   methocarbamol  (ROBAXIN ) 500 MG tablet, Take 1 tablet (500 mg total) by mouth 2 (two) times daily., Disp: 20 tablet, Rfl: 0    methylPREDNISolone  (MEDROL  DOSEPAK) 4 MG TBPK tablet, Take as directed on package., Disp: 21 each, Rfl: 0   montelukast  (SINGULAIR ) 10 MG tablet,  Take 10 mg by mouth at bedtime. , Disp: , Rfl:    potassium chloride  SA (KLOR-CON  M20) 20 MEQ tablet, Take 1 tablet (20 mEq total) by mouth daily., Disp: 90 tablet, Rfl: 3   RESTASIS  0.05 % ophthalmic emulsion, Place 1 drop into both eyes 2 (two) times daily as needed (dry eyes)., Disp: , Rfl:    torsemide  (DEMADEX ) 20 MG tablet, Take 1 tablet (20 mg total) by mouth 2 (two) times daily., Disp: 180 tablet, Rfl: 0  Review of Systems:  Negative unless indicated in HPI.   Physical Exam: Vitals:   11/26/23 1054 11/26/23 1058  BP: (!) 150/80 (!) 149/82  Pulse: 71   Temp: 98.1 F (36.7 C)   TempSrc: Oral   SpO2: 100%   Weight: 174 lb 6.4 oz (79.1 kg)     Body mass index is 25.02 kg/m.    Impression and Plan:  Buttock pain -     HYDROcodone -Acetaminophen ; Take 1-2 tablets by mouth every 6 (six) hours as needed for moderate pain (pain score 4-6).  Dispense: 90 tablet; Refill: 0   - Based on recommendations from neurosurgery it does not seem likely that his buttock pain is related to his spinal stenosis.  I think it is very likely that it is related to bone bruising after the fall.  He has tried NSAIDs and muscle relaxers without relief.  Will start a course of hydrocodone  to see if helps.  Discussed with son that he would definitely benefit from a life alert given his increasing falls.  Time spent:31 minutes reviewing chart, interviewing and examining patient and formulating plan of care.     Tully Theophilus Andrews, MD [facility]

## 2023-11-28 ENCOUNTER — Ambulatory Visit: Payer: Self-pay

## 2023-11-28 ENCOUNTER — Telehealth: Payer: Self-pay | Admitting: *Deleted

## 2023-11-28 DIAGNOSIS — M7918 Myalgia, other site: Secondary | ICD-10-CM

## 2023-11-28 NOTE — Telephone Encounter (Signed)
 Copied from CRM #[id number]. Topic: Clinical - Red Word Triage >> Nov 28, 2023  8:35 AM Jasmin G wrote: Red Word that prompted transfer to Nurse Triage: Pt states that he fell on Sunday and was prescribed painkillers by her PCP, Dr. Theophilus, please refer to recent Encounters for more info, pt states that med has not really been working as he is still in excruciating pain. >> Nov 28, 2023  9:29 AM Franky GRADE wrote: Patient is calling to speak with Dr.Hernandez Acosta's nurse regarding pain medication that was prescribed. Patient states the medication is not helping at all. Patient was triaged and did not want to speak to another triage nurse.

## 2023-11-28 NOTE — Telephone Encounter (Signed)
 Patient was recently seen by provider for pain to buttocks. Patient was given pain medication. Patient called today with continued pain where he isn't sleeping. Patient states medication isn't helping. Patient is asking for a follow up call from office staff.  FYI Only or Action Required?: Action required by provider: update on patient condition.  Patient was last seen in primary care on 11/26/2023 by Theophilus Andrews, Tully GRADE, MD.  Called Nurse Triage reporting Pain.  Symptoms began several weeks ago.  Interventions attempted: Prescription medications: hydrocodone  and Rest, hydration, or home remedies.  Symptoms are: unchanged.  Triage Disposition: No disposition on file.  Patient/caregiver understands and will follow disposition?:    Copied from CRM [id number]. Topic: Clinical - Red Word Triage >> Nov 28, 2023  8:35 AM Jasmin G wrote: Red Word that prompted transfer to Nurse Triage: Pt states that he fell on Sunday and was prescribed painkillers by her PCP, Dr. Theophilus, please refer to recent Encounters for more info, pt states that med has not really been working as he is still in excruciating pain. Reason for Disposition  [1] SEVERE pain (e.g., excruciating, unable to do any normal activities) AND [2] not improved 2 hours after pain medicine  Answer Assessment - Initial Assessment Questions 1. ONSET: When did the muscle aches or body pains start?      Post fall 2. LOCATION: What part of your body is hurting? (e.g., entire body, arms, legs)      buttocks 3. SEVERITY: How bad is the pain? (Scale 1-10; or mild, moderate, severe)     severe 4. CAUSE: What do you think is causing the pains?     Fall backwards onto buttocks  Protocols used: Muscle Aches and Body Pain-A-AH

## 2023-11-28 NOTE — Telephone Encounter (Signed)
 Spoke to the patient and he is taking 2 tablets of Hydrocodone  every 6 hours.  He says that his pain is the same, but no improvement, and he can't sleep at night.

## 2023-11-28 NOTE — Telephone Encounter (Signed)
 Patient is aware and a referral was placed.

## 2023-11-29 ENCOUNTER — Encounter: Payer: Self-pay | Admitting: Physician Assistant

## 2023-11-29 ENCOUNTER — Ambulatory Visit: Admitting: Physician Assistant

## 2023-11-29 DIAGNOSIS — M79605 Pain in left leg: Secondary | ICD-10-CM | POA: Diagnosis not present

## 2023-11-29 DIAGNOSIS — M79604 Pain in right leg: Secondary | ICD-10-CM | POA: Diagnosis not present

## 2023-11-29 NOTE — Progress Notes (Signed)
 Office Visit Note   Patient: Charles Lloyd.           Date of Birth: 16-Apr-1940           MRN: [id number] Visit Date: 11/29/2023              Requested by: Theophilus Andrews, Tully GRADE, MD [id number] [place],  [place] [address] PCP: Theophilus Andrews, Tully GRADE, MD  No chief complaint on file.     HPI: Patient is a pleasant 83 year old gentleman who is followed by [name].  He has a history of spondylosis and canal stenosis.  He says that about a month ago he had a fall onto his buttock.  Since then he has been having bilateral buttock pain which radiates alternatively down the back of the right and left leg.  He says it feels like a taser .  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  He is normally much more active and is now using a cane.  He had multiple studies in the emergency room and this happened.  His imaging on his lumbar spine showed stenosis moderate to severe.  However this was fairly unchanged than previous MRI 2 years ago.  Based on his exam today it still seems fairly radicular in nature.  He has severe stenosis and he describes pain when he tries to walk any distance.  He is very frustrated as he normally likes to golf.  I do not think this is coming from his hips he did have a CT of his pelvis it did not show any acute osseous injuries and he has no groin pain.  I do not think this is trochanteric bursitis because of the pain pattern.  I do think he do well with a second opinion perhaps with Dr. Georgina.  He has declined to this.  He does have prescriptions for physical therapy and I have encouraged him to keep that.  Can follow-up with us  as needed  Assessment & Plan: Visit Diagnoses:  1. Bilateral leg pain     Plan: Patient will engage with physical therapy I have offered him a second opinion with one of our physicians he has declined this at this time  Follow-Up Instructions: No follow-ups on file.   Ortho  Exam  Patient is alert, oriented, no adenopathy, well-dressed, normal affect, normal respiratory effort. Examination he has no pain sitting he has no pain with manipulation of his hips no bruising mild tenderness over the trochanteric bursa but does not bother him very much and not a symptom more goes down the back of his leg than the lateral side.  He has got good strength with resisted flexion extension of his legs.  He does have baseline neuropathy in his lower legs no evidence of an infectious process    Imaging: No results found. No images are attached to the encounter.  Labs: Lab Results  Component Value Date   HGBA1C 6.0 (A) 07/31/2023   HGBA1C 6.7 (H) 07/12/2022   HGBA1C 5.6 01/08/2022   ESRSEDRATE 2 10/17/2021   ESRSEDRATE 85 (H) 08/14/2021   ESRSEDRATE 52 (H) 08/06/2021   CRP 8.4 (H) 08/06/2021   CRP 3.5 12/18/2019   LABURIC 5.0 10/17/2021   LABURIC 7.0 08/06/2021   LABURIC 12.6 (H) 06/23/2021   REPTSTATUS 08/17/2021 FINAL 08/12/2021   REPTSTATUS 08/17/2021 FINAL 08/12/2021   GRAMSTAIN  12/18/2018    NO WBC SEEN RARE GRAM POSITIVE COCCI IN  PAIRS RARE GRAM POSITIVE RODS    CULT  08/12/2021    NO GROWTH 5 DAYS Performed at [facility], [address] [place], [place] [address]    CULT  08/12/2021    NO GROWTH 5 DAYS Performed at [facility], [address], [place], [place] [address]      Lab Results  Component Value Date   ALBUMIN 3.8 07/17/2022   ALBUMIN 3.6 07/12/2022   ALBUMIN 4.0 04/06/2022    Lab Results  Component Value Date   MG 2.2 11/16/2021   MG 2.1 11/15/2021   MG 2.0 08/08/2021   Lab Results  Component Value Date   VD25OH 52.40 07/12/2022   VD25OH 30 12/18/2019   VD25OH 56.02 10/30/2018    No results found for: PREALBUMIN    Latest Ref Rng & Units 10/30/2023   11:24 AM 10/08/2023   11:04 AM 08/23/2023    9:50 AM  CBC EXTENDED  WBC 4.0 - 10.5 K/uL 3.5  5.5    RBC 4.22 - 5.81 MIL/uL 3.49  3.44    Hemoglobin 13.0 -  17.0 g/dL 89.4  89.2  89.2   HCT 39.0 - 52.0 % 31.8  31.8  32.7   Platelets 150 - 400 K/uL 175  179    NEUT# 1.7 - 7.7 K/uL 2.0     Lymph# 0.7 - 4.0 K/uL 1.0        There is no height or weight on file to calculate BMI.  Orders:  No orders of the defined types were placed in this encounter.  No orders of the defined types were placed in this encounter.    Procedures: No procedures performed  Clinical Data: No additional findings.  ROS:  All other systems negative, except as noted in the HPI. Review of Systems  Objective: Vital Signs: There were no vitals taken for this visit.  Specialty Comments:  No specialty comments available.  PMFS History: Patient Active Problem List   Diagnosis Date Noted   Bilateral leg pain 11/29/2023   Acute posthemorrhagic anemia 06/21/2022   Bilateral lower extremity edema 04/06/2022   Coronary artery disease involving native coronary artery of native heart without angina pectoris 04/05/2022   Gout 02/27/2022   Acute renal failure superimposed on stage 3b chronic kidney disease (HCC) 11/16/2021   Uremia 11/16/2021   Gout due to renal impairment 10/17/2021   Pancytopenia (HCC) 08/05/2021   SIRS (systemic inflammatory response syndrome) (HCC) 08/04/2021   Chronic kidney disease, stage 3b (HCC) 12/30/2020   Multiple falls    Spinal stenosis    S/P TKR (total knee replacement), bilateral    Arthralgia 11/20/2019   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   Lesion of liver 04/20/2019   Secondary hypercoagulable state 04/09/2019   Eosinophil count raised 02/17/2019   Acute tracheobronchitis 01/05/2019   Asthma    Vitamin D  deficiency 07/25/2018   Chronic anticoagulation 03/06/2018   Hyperlipidemia associated with type 2 diabetes mellitus (HCC) 01/28/2018   Basal cell carcinoma (BCC) of left side of nose 01/28/2018   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   Artificial knee joint present 05/20/2017   Left knee pain 05/07/2017   B12 deficiency  10/22/2016   Overweight (BMI 25.0-29.9) 10/22/2016   Primary osteoarthritis of right knee 03/29/2015   Osteoarthritis of right knee 03/29/2015   History of cholecystectomy 11/20/2014   Occult blood in stools 02/09/2014   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   Paroxysmal A-fib (HCC) 04/16/2013   Chronic heart failure with preserved  ejection fraction (HFpEF) (HCC) 04/16/2013   Colon polyps 04/16/2013   NIDDM-2 with polyneuropathy and hyperglycemia 04/16/2013   Tinea cruris 04/16/2013   Diabetic neuropathy (HCC) 07/14/2012   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) 07/14/2012   Insomnia 03/04/2012   History of cardiovascular disorder 08/16/2011   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 Functional class II, ejection fraction 35-40%   Chronic anticoagulation    Chronic diastolic (congestive) heart failure (HCC) 04/16/2013   Functional class II, ejection fraction 35-40%  Last Assessment & Plan:  Clinically stable Volume well controlled meds reviewed   Chronic diastolic CHF (congestive heart failure) (HCC) 04/16/2013   Functional class II, ejection fraction 35-40%  Last Assessment & Plan:  Clinically stable Volume well controlled meds reviewed   Colon polyps 04/16/2013   Coronary artery disease involving native coronary artery of native heart without angina pectoris 04/05/2022   Myoview  05/25/2019: EF 60, normal perfusion, low  risk CCTA (AF protocol) 04/25/20: CAC score 4242 (99th percentile)   Diabetic neuropathy (HCC)    Eosinophil count raised 02/17/2019   Essential hypertension 01/06/2010   Last Assessment & Plan:  Well controlled Continue med management   Gout    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  Steroid usage was discontinued several months ago.  Follow up with Dermatology as planned.   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   Last Assessment & Plan:  Id following ?ongoing abx managementy reported Request records   Infection of prosthetic right knee joint    Insomnia 03/04/2012   Grief with loss of wife 02/20/12  Last Assessment & Plan:  Improved sx  contineu xanax prn Se discussed   Lesion of liver 04/20/2019   -On cardiac CT 02/2019   Mild reactive airways disease    Neoplasm of prostate 04/16/2013   Neoplasm of prostate, 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 Transthoracic Echocardiogram-03/10/2013-[facility]: Normal left ventricular wall thickness and cavity size.  Global left ventricular systolic function is moderately reduced.  The estimated ejection fraction is 35-40%.  The left atrium is moderately enlarged.  The right atrium is mildly enlarged.  No significant valvular    Overweight (BMI 25.0-29.9) 10/22/2016    Persistent atrial fibrillation (HCC) 04/16/2013   Last Assessment & Plan:  Rate controlled Continue med management eliquis   for stroke preventino  Formatting of this note might be different from the original.  Drug  HX Current Rx Pre-ABL inefficacy Pre-ABL intolerant Post-ABL inefficacy Post-ABL intolerant max dose/24h M/Y end comments  sotalol                  dofetilide                  flecainide                   propafenone                  am   S/P TKR (total knee replacement), bilateral    Secondary hypercoagulable state 04/09/2019   Tinea cruris 04/16/2013   Type 2 diabetes mellitus with diabetic polyneuropathy, without long-term current use of insulin  (HCC) 04/16/2013   Last Assessment & Plan:  Labs today Pt reports well controlled on ambulatory monitoring   Vitamin D  deficiency 07/25/2018    Family History  Problem Relation Age of Onset   Cancer Mother    Depression Mother    Early death Mother    Cancer Father    Depression Father    Early death Father     Past Surgical History:  Procedure Laterality Date   ATRIAL FIBRILLATION ABLATION N/A 03/12/2019   Procedure: ATRIAL FIBRILLATION ABLATION;  Surgeon: Inocencio Soyla Lunger, MD;  Location: [facility];  Service: Cardiovascular;  Laterality: N/A;   ATRIAL FIBRILLATION ABLATION N/A 04/29/2020   Procedure: ATRIAL FIBRILLATION ABLATION;  Surgeon: Inocencio Soyla Lunger, MD;  Location: [facility];  Service: Cardiovascular;  Laterality: N/A;   CARDIAC ELECTROPHYSIOLOGY STUDY AND ABLATION     CARDIOVERSION     CARDIOVERSION N/A 10/10/2018   Procedure: CARDIOVERSION;  Surgeon: Delford Maude BROCKS, MD;  Location: [facility] ENDOSCOPY;  Service: Cardiovascular;  Laterality: N/A;   CARDIOVERSION N/A 05/18/2020   Procedure: CARDIOVERSION;  Surgeon: Alveta Aleene PARAS, MD;  Location: [facility] ENDOSCOPY;  Service: Cardiovascular;  Laterality: N/A;   CARDIOVERSION N/A 12/09/2020   Procedure: CARDIOVERSION;  Surgeon: Pietro Redell RAMAN, MD;  Location: [facility]  ENDOSCOPY;  Service: Cardiovascular;  Laterality: N/A;   CHOLECYSTECTOMY     PROSTATECTOMY     REPLACEMENT TOTAL KNEE BILATERAL     Social History   Occupational History   Not on file  Tobacco Use   Smoking status: Never    Passive exposure: Never   Smokeless tobacco: Never  Vaping Use   Vaping status: Never Used  Substance and Sexual Activity   Alcohol use: Yes    Alcohol/week: 14.0 standard drinks of alcohol    Types: 14 Glasses of wine per week   Drug use: Never   Sexual activity: Not Currently

## 2023-12-03 ENCOUNTER — Telehealth: Payer: Self-pay | Admitting: *Deleted

## 2023-12-03 NOTE — Telephone Encounter (Signed)
 Patient is aware.

## 2023-12-03 NOTE — Telephone Encounter (Signed)
 Copied from CRM [id number]. Topic: General - Other >> Dec 03, 2023  8:06 AM Burnard DEL wrote: Reason for CRM: Patient is requesting a phone call in regards to his visit to the orthopedic doctor on Friday. He said there has been no change,and he is still in a lot of pain,and can barely walk.

## 2023-12-06 ENCOUNTER — Other Ambulatory Visit: Payer: Self-pay | Admitting: Internal Medicine

## 2023-12-09 ENCOUNTER — Telehealth: Payer: Self-pay | Admitting: Internal Medicine

## 2023-12-09 NOTE — Telephone Encounter (Signed)
 CRM has been sent to clinic, verified by this RN

## 2023-12-09 NOTE — Telephone Encounter (Signed)
 Copied from CRM [id number]. Topic: Clinical - Refused Triage >> Dec 09, 2023  8:13 AM Carlyon D wrote: Patient/caller voiced complaints of severe pain on his back that's not getting better . Declined transfer to triage.

## 2023-12-10 ENCOUNTER — Encounter: Payer: Self-pay | Admitting: Physical Therapy

## 2023-12-10 ENCOUNTER — Other Ambulatory Visit: Payer: Self-pay

## 2023-12-10 ENCOUNTER — Ambulatory Visit: Admitting: Physical Therapy

## 2023-12-10 DIAGNOSIS — M5459 Other low back pain: Secondary | ICD-10-CM | POA: Insufficient documentation

## 2023-12-10 DIAGNOSIS — R296 Repeated falls: Secondary | ICD-10-CM | POA: Diagnosis present

## 2023-12-10 DIAGNOSIS — M6281 Muscle weakness (generalized): Secondary | ICD-10-CM | POA: Insufficient documentation

## 2023-12-10 DIAGNOSIS — R2681 Unsteadiness on feet: Secondary | ICD-10-CM | POA: Diagnosis present

## 2023-12-10 NOTE — Patient Instructions (Signed)
 Trigger Point Dry Needling  What is Trigger Point Dry Needling (DN)? DN is a physical therapy technique used to treat muscle pain and dysfunction. Specifically, DN helps deactivate muscle trigger points (muscle knots).  A thin filiform needle is used to penetrate the skin and stimulate the underlying trigger point. The goal is for a local twitch response (LTR) to occur and for the trigger point to relax. No medication of any kind is injected during the procedure.  Benefits of Trigger Point Dry Needling Reduces localized tension and stiffness promoting muscle function. Promotes range of motion and flexibility of the area being treated. Relieves localized and referred muscle related pain.  Promotes localized blood flow. Benefits of DN increase when performed with formal therapy including strengthening and stretching.   What Does Trigger Point Dry Needling Feel Like?  The procedure feels different for each individual patient. Some patients report that they do not actually feel the needle enter the skin and overall the process is not painful. Very mild bleeding may occur. However, many patients feel a deep cramping in the muscle in which the needle was inserted. This is the local twitch response.   How Will I feel after the treatment? Soreness is normal, and the onset of soreness may not occur for a few hours. Typically this soreness does not last longer than two days.  Bruising is uncommon, however; ice can be used to decrease any possible bruising.  In rare cases feeling tired or nauseous after the treatment is normal. In addition, your symptoms may get worse before they get better, this period will typically not last longer than 24 hours.   What Can I do After My Treatment? Increase your hydration by drinking more water for the next 24 hours.  You may place ice or heat on the areas treated that have become sore, however, do not use heat on inflamed or bruised areas. Heat often brings more  relief post needling. You can continue your regular activities, but vigorous activity is not recommended initially after the treatment for 24 hours. DN is best combined with other physical therapy such as strengthening, stretching, and other therapies.   What are the complications? While your therapist has had extensive training in minimizing the risks of trigger point dry needling, it is important to understand the risks of any procedure.  Risks include bleeding, pain, fatigue, hematoma, infection, vertigo, nausea or nerve involvement. Monitor for any changes to your skin or sensation. Contact your therapist or MD with concerns.  A rare but serious complication is a pneumothorax over or near your middle and upper chest and back If you have dry needling in this area, monitor for the following symptoms: Shortness of breath on exertion and/or Difficulty taking a deep breath and/or Chest Pain and/or A dry cough If any of the above symptoms develop, please go to the nearest emergency room or call 911. Tell them you had dry needling over your thorax and report any symptoms you are having. Please follow-up with your treating therapist after you complete the medical evaluation.

## 2023-12-10 NOTE — Telephone Encounter (Unsigned)
 Copied from CRM [id number]. Topic: General - Other >> Dec 09, 2023  8:14 AM Carlyon D wrote: Reason for CRM: Pt calling sating he has severe back pain and has been seen for it multiple times but states its getting worse and ER sends him home and tells him he is fine. Pt refused nurse triage, pt is just wanting pcp to order him a Physical therapy provider to come to his home and help him as he can barley walk now and can't drive due to the pain. Please reach out to pt

## 2023-12-10 NOTE — Therapy (Signed)
 OUTPATIENT PHYSICAL THERAPY THORACOLUMBAR EVALUATION   Patient Name: Charles Lloyd. MRN: [id number] DOB:1940/12/01, 83 y.o., male Today's Date: 12/10/2023  END OF SESSION:  PT End of Session - 12/10/23 1105     Visit Number 1    Date for Recertification  02/04/24    Authorization Type [employer] MCR auth pending (16 visits 10/14 to 12/9)    Progress Note Due on Visit 10    PT Start Time 1106    PT Stop Time 1143    PT Time Calculation (min) 37 min    Activity Tolerance Patient tolerated treatment well    Behavior During Therapy WFL for tasks assessed/performed          Past Medical History:  Diagnosis Date   Acute tracheobronchitis 01/05/2019   B12 deficiency    Basal cell carcinoma (BCC) of left side of nose 01/28/2018   Cancer (HCC)    Cardiomyopathy (HCC) 03/10/2018   With chronic atrial fibrillation   CHF (congestive heart failure) (HCC) 04/16/2013   Functional class II, ejection fraction 35-40%  Formatting of this note might be different from the original. Functional class II, ejection fraction 35-40%   Chronic anticoagulation    Chronic diastolic (congestive) heart failure (HCC) 04/16/2013   Functional class II, ejection fraction 35-40%  Last Assessment & Plan:  Clinically stable Volume well controlled meds reviewed   Chronic diastolic CHF (congestive heart failure) (HCC) 04/16/2013   Functional class II, ejection fraction 35-40%  Last Assessment & Plan:  Clinically stable Volume well controlled meds reviewed   Colon polyps 04/16/2013   Coronary artery disease involving native coronary artery of native heart without angina pectoris 04/05/2022   Myoview  05/25/2019: EF 60, normal perfusion, low risk CCTA (AF protocol) 04/25/20: CAC score 4242 (99th percentile)   Diabetic neuropathy (HCC)    Eosinophil count raised 02/17/2019   Essential hypertension 01/06/2010   Last Assessment & Plan:  Well controlled Continue med management   Gout    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  Steroid usage was discontinued several months ago.  Follow up with Dermatology as planned.   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   Last Assessment & Plan:  Id following ?ongoing abx managementy reported Request records   Infection of prosthetic right knee joint    Insomnia 03/04/2012   Grief with loss of wife 02/20/12  Last Assessment & Plan:  Improved sx  contineu xanax prn Se discussed   Lesion of liver 04/20/2019   -On cardiac CT 02/2019   Mild reactive airways disease    Neoplasm of prostate 04/16/2013   Neoplasm of prostate, 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 Transthoracic Echocardiogram-03/10/2013-[facility]: Normal left ventricular wall thickness and cavity size.  Global left ventricular systolic function is moderately reduced.  The estimated ejection fraction is 35-40%.  The left atrium  is moderately enlarged.  The right atrium is mildly enlarged.  No significant valvular    Overweight (BMI 25.0-29.9) 10/22/2016   Persistent atrial fibrillation (HCC) 04/16/2013   Last Assessment & Plan:  Rate controlled Continue med management eliquis  for stroke preventino  Formatting of this note might be different from the original.  Drug  HX Current Rx Pre-ABL inefficacy Pre-ABL intolerant Post-ABL  inefficacy Post-ABL intolerant max dose/24h M/Y end comments  sotalol                  dofetilide                  flecainide                   propafenone                  am   S/P TKR (total knee replacement), bilateral    Secondary hypercoagulable state 04/09/2019   Tinea cruris 04/16/2013   Type 2 diabetes mellitus with diabetic polyneuropathy, without long-term current use of insulin  (HCC) 04/16/2013   Last Assessment & Plan:  Labs today Pt reports well controlled on ambulatory monitoring   Vitamin D  deficiency 07/25/2018   Past Surgical History:  Procedure Laterality Date   ATRIAL FIBRILLATION ABLATION N/A 03/12/2019   Procedure: ATRIAL FIBRILLATION ABLATION;  Surgeon: Inocencio Soyla Lunger, MD;  Location: [facility];  Service: Cardiovascular;  Laterality: N/A;   ATRIAL FIBRILLATION ABLATION N/A 04/29/2020   Procedure: ATRIAL FIBRILLATION ABLATION;  Surgeon: Inocencio Soyla Lunger, MD;  Location: [facility];  Service: Cardiovascular;  Laterality: N/A;   CARDIAC ELECTROPHYSIOLOGY STUDY AND ABLATION     CARDIOVERSION     CARDIOVERSION N/A 10/10/2018   Procedure: CARDIOVERSION;  Surgeon: Delford Maude BROCKS, MD;  Location: [facility] ENDOSCOPY;  Service: Cardiovascular;  Laterality: N/A;   CARDIOVERSION N/A 05/18/2020   Procedure: CARDIOVERSION;  Surgeon: Alveta Aleene PARAS, MD;  Location: [facility] ENDOSCOPY;  Service: Cardiovascular;  Laterality: N/A;   CARDIOVERSION N/A 12/09/2020   Procedure: CARDIOVERSION;  Surgeon: Pietro Redell RAMAN, MD;  Location: [facility] ENDOSCOPY;  Service: Cardiovascular;  Laterality: N/A;   CHOLECYSTECTOMY     PROSTATECTOMY     REPLACEMENT TOTAL KNEE BILATERAL     Patient Active Problem List   Diagnosis Date Noted   Bilateral leg pain 11/29/2023   Acute posthemorrhagic anemia 06/21/2022   Bilateral lower extremity edema 04/06/2022   Coronary artery disease involving native coronary artery of native heart without angina pectoris 04/05/2022   Gout 02/27/2022   Acute renal  failure superimposed on stage 3b chronic kidney disease (HCC) 11/16/2021   Uremia 11/16/2021   Gout due to renal impairment 10/17/2021   Pancytopenia (HCC) 08/05/2021   SIRS (systemic inflammatory response syndrome) (HCC) 08/04/2021   Chronic kidney disease, stage 3b (HCC) 12/30/2020   Multiple falls    Spinal stenosis    S/P TKR (total knee replacement), bilateral    Arthralgia 11/20/2019   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   Lesion of liver 04/20/2019   Secondary hypercoagulable state 04/09/2019   Eosinophil count raised 02/17/2019   Acute tracheobronchitis 01/05/2019   Asthma    Vitamin D  deficiency 07/25/2018   Chronic anticoagulation 03/06/2018   Hyperlipidemia associated with type 2 diabetes mellitus (HCC) 01/28/2018   Basal cell carcinoma (BCC) of left side of nose 01/28/2018   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   Artificial knee joint present 05/20/2017   Left knee pain 05/07/2017   B12 deficiency 10/22/2016  Overweight (BMI 25.0-29.9) 10/22/2016   Primary osteoarthritis of right knee 03/29/2015   Osteoarthritis of right knee 03/29/2015   History of cholecystectomy 11/20/2014   Occult blood in stools 02/09/2014   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   Paroxysmal A-fib (HCC) 04/16/2013   Chronic heart failure with preserved ejection fraction (HFpEF) (HCC) 04/16/2013   Colon polyps 04/16/2013   NIDDM-2 with polyneuropathy and hyperglycemia 04/16/2013   Tinea cruris 04/16/2013   Diabetic neuropathy (HCC) 07/14/2012   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) 07/14/2012   Insomnia 03/04/2012   History of cardiovascular disorder 08/16/2011   Hypertensive heart disease with heart failure (HCC) 01/06/2010   Neoplasm of prostate, malignant (HCC) 01/06/2010   Hypercholesterolemia 01/06/2010   Essential hypertension 01/06/2010    PCP: Theophilus Andrews, Tully GRADE, MD   REFERRING PROVIDER: Darnella Dorn SAUNDERS, MD   REFERRING DIAG: [phone] (ICD-10-CM) -  Spinal stenosis, lumbar region with neurogenic claudication   Rationale for Evaluation and Treatment: Rehabilitation  THERAPY DIAG:  Unsteadiness on feet - Plan: PT plan of care cert/re-cert  Muscle weakness (generalized) - Plan: PT plan of care cert/re-cert  Other low back pain - Plan: PT plan of care cert/re-cert  Repeated falls - Plan: PT plan of care cert/re-cert  ONSET DATE: September 2024  SUBJECTIVE:                                                                                                                                                                                           SUBJECTIVE STATEMENT:  Recent fall Friday morning. A lot of pain in his rear end since the fall in September. Since that time he sits a lot and has gotten weaker and weaker. Injections in back every 3 months. Would like to get back to walking short distances at [employer]. Feels taser shot in both legs intermittently. Hasn't slept in 3-4 weeks. Rolls over and feels it. Cannot stand and do dishes, cannot drive x 2 months. Has to be holding onto something for balance. Has chair lift at home.  Doing a bone density scan to see if surgery is viable. Trying chiropractor as well.   Accompanied by son  PERTINENT HISTORY:  Lumbar anterolisthesis, Peripheral neuropathy, TKR bilat, A-fib, CHF, DM   PAIN:  Are you having pain? Yes: NPRS scale: 3/10  up to 9/10 Pain location: L  buttocks to post knees Pain description: taser Aggravating factors: unsure Relieving factors: nothing  PRECAUTIONS: Fall  RED FLAGS: None   WEIGHT BEARING RESTRICTIONS: No  FALLS:  Has patient fallen in last 6 months? Yes. Number of falls 5  LIVING ENVIRONMENT:  Lives  with: lives alone and family is near by and can help him easily  Lives in: House/apartment Stairs: two story townhouse with stair lift inside, has one step going up to chair lift Has following equipment at home: Single point cane, Walker - 4 wheeled, and  stair lift, raised toilet seats, shower stool   OCCUPATION: retired  PLOF: Independent with basic ADLs  PATIENT GOALS: Get back to independence  NEXT MD VISIT:   OBJECTIVE:  Note: Objective measures were completed at Evaluation unless otherwise noted.  DIAGNOSTIC FINDINGS:  MRI 1. Lumbar spondylosis and degenerative disc disease, causing markedly severe impingement at L4-5; prominent impingement at L2-3; and moderate to prominent impingement at L3-4. This impingement is roughly similar to the prior MRI exam from 08/04/2021. 2. Transitional lumbosacral vertebra is assumed to represent the S1 level. This conforms to the segmentation utilized on prior exams. Careful correlation with this numbering strategy prior to any procedural intervention would be recommended.  PATIENT SURVEYS:  THE PATIENT SPECIFIC FUNCTIONAL SCALE  Place score of 0-10 (0 = unable to perform activity and 10 = able to perform activity at the same level as before injury or problem)  Activity Date: 12/10/23    Standing to do dishes 0    2. Sleeping nightly  2    3 . Shopping 1x/wk 0    4.      Total Score 0.7      Total Score = Sum of activity scores/number of activities  Minimally Detectable Change: 3 points (for single activity); 2 points (for average score)  [facility] (nd). The Patient Specific Functional Scale . Retrieved from [email or web address]   COGNITION: Overall cognitive status: Within functional limits for tasks assessed     SENSATION: Neuropathy from knees down B  MUSCLE LENGTH: NT  POSTURE: rounded shoulders, forward head, and flexed trunk   PALPATION: Left gluteals piriformis  LUMBAR ROM: pain with neutral spine, flexion WFL  LOWER EXTREMITY ROM:   WNL for tasks assessed    Right eval Left eval  Hip flexion    Hip extension    Hip abduction    Hip adduction    Hip internal rotation    Hip external  rotation    Knee flexion    Knee extension    Ankle dorsiflexion    Ankle plantarflexion    Ankle inversion    Ankle eversion     (Blank rows = not tested)  LOWER EXTREMITY MMT:    MMT Right eval Left eval  Hip flexion 3 3+  Hip extension    Hip abduction 4 4-  Hip adduction 4+ 4+  Hip internal rotation    Hip external rotation    Knee flexion 4 4+  Knee extension 4+ 5  Ankle dorsiflexion 3- 3-  Ankle plantarflexion    Ankle inversion    Ankle eversion     (Blank rows = not tested)    FUNCTIONAL TESTS:  5 times sit to stand: 10.03 sec with B UE support and does not hold the stand (falls bwd) Timed up and go (TUG): 17.9 sec  GAIT: Distance walked: 40 Assistive device utilized: Environmental consultant - 4 wheeled Level of assistance: Modified independence and SBA Comments: flexed trunk, one incidence of catching his toe  TREATMENT DATE:  12/10/23  See pt ed and HEP   PATIENT EDUCATION:  Education details: PT eval findings, anticipated POC, initial HEP, gait safety with RW at all times, role of TPDN, and discussion of stenosis and posture and possible trial of aquatics.   Person educated: Patient and Child(ren) Education method: Explanation, Demonstration, Tactile cues, Verbal cues, and Handouts Education comprehension: verbalized understanding and returned demonstration  HOME EXERCISE PROGRAM: Access Code: [email or web address] URL: [email or web address] Date: 12/10/2023 Prepared by: Mliss  Exercises - Sit to Stand with Counter Support  - 3 x daily - 7 x weekly - 1 sets - 10 reps - Seated Hip Abduction with Resistance  - 1 x daily - 7 x weekly - 2 sets - 10 reps - Seated March  - 1 x daily - 7 x weekly - 2 sets - 10 reps  ASSESSMENT:  CLINICAL IMPRESSION: Patient is a 83 y.o. male who was seen today for physical therapy evaluation and treatment  for B leg pain and weakness secondary to lumbar spinal stenosis. He reports 5 falls in the past 6 months and in the past 2 months has become dependent on his son for certain ADLs including washing dishes, shopping and driving. He presents with a 4WW which he uses in the community, but he uses a cane or furniture walks at home. PT advised the walker at all times since all falls have occurred at home. His pain is affecting his sleep and his ability to stand. He reports he used to go to the grocery store at least weekly and walk around with a cart without difficulty. He would like to return to his previous level of function. His lift requires he step up on an 8 inch stair which is progressively getting harder to do. He will benefit from skilled PT to address these deficits and those listed below.   OBJECTIVE IMPAIRMENTS: decreased activity tolerance, decreased balance, decreased endurance, difficulty walking, decreased ROM, decreased strength, decreased safety awareness, increased muscle spasms, impaired flexibility, postural dysfunction, and pain.   ACTIVITY LIMITATIONS: standing, sleeping, stairs, transfers, bed mobility, bathing, and locomotion level  PARTICIPATION LIMITATIONS: meal prep, cleaning, laundry, driving, shopping, and community activity  PERSONAL FACTORS: Age, Fitness, Time since onset of injury/illness/exacerbation, and 3+ comorbidities:  Peripheral neuropathy, TKR bilat, A-fib, CHF, DM are also affecting patient's functional outcome.   REHAB POTENTIAL: Good  CLINICAL DECISION MAKING: Evolving/moderate complexity  EVALUATION COMPLEXITY: Moderate   GOALS: Goals reviewed with patient? Yes  SHORT TERM GOALS: Target date: 01/07/2024   Patient will be independent with initial HEP.  Baseline:  Goal status: INITIAL  2.  Patient will report decreased left hip pain with sleeping by 50%.  Baseline:  Goal status: INITIAL  3.  Patient compliant with use of 4WW at home on main  floor. Baseline:  Goal status: INITIAL   LONG TERM GOALS: Target date: 02/04/2024   Patient will be independent with advanced/ongoing HEP to improve outcomes and carryover.  Baseline:  Goal status: INITIAL  2.  Patient will report 70% improvement in pain with ADLs to improve QOL.  Baseline:  Goal status: INITIAL  3.  Patient will able to step up on 8 inch step with one UE assist to reach stair lift easier. Baseline:  Goal status: INITIAL  4.  Patient will demonstrate improved functional strength by being able to complete 5XSTS without UE assist. Baseline:  Goal status: INITIAL  5.  Patient will score 3 or higher on PSFS demonstrating improved functional ability.  Baseline: 0.7  Goal status: INITIAL  6.  Patient able to stand to do his dishes .  Baseline:  Goal status: INITIAL     PLAN:  PT FREQUENCY: 2x/week  PT DURATION: 8 weeks  PLANNED INTERVENTIONS: 97164- PT Re-evaluation, 97110-Therapeutic exercises, 97530- Therapeutic activity, 97112- Neuromuscular re-education, 97535- Self Care, 02859- Manual therapy, [phone]- Gait training, [phone]- Aquatic Therapy, [phone]- Electrical stimulation (unattended), [id number]- Ultrasound, [id number]- Ionotophoresis 4mg /ml Dexamethasone , 79439 (1-2 muscles), 20561 (3+ muscles)- Dry Needling, Patient/Family education, Balance training, Stair training, Taping, Joint mobilization, Spinal mobilization, DME instructions, Cryotherapy, and Moist heat.  PLAN FOR NEXT SESSION: Review and progress HEP, DN to left hip, work on sit to stands, 8 inch step ups, gait, balance    Mliss Cummins, PT 12/10/23 2:31 PM

## 2023-12-11 ENCOUNTER — Other Ambulatory Visit: Payer: Self-pay | Admitting: Internal Medicine

## 2023-12-11 DIAGNOSIS — G6289 Other specified polyneuropathies: Secondary | ICD-10-CM

## 2023-12-11 NOTE — Telephone Encounter (Signed)
 Copied from CRM [id number]. Topic: Clinical - Medication Refill >> Dec 11, 2023  8:21 AM Pinkey ORN wrote: Medication: gabapentin  (NEURONTIN ) 300 MG capsule, atorvastatin  (LIPITOR) 20 MG tablet  Has the patient contacted their pharmacy? Yes (Agent: If no, request that the patient contact the pharmacy for the refill. If patient does not wish to contact the pharmacy document the reason why and proceed with request.) (Agent: If yes, when and what did the pharmacy advise?)  This is the patient's preferred pharmacy:   [id number] [address], [place] - [address] [place] [address] Phone: [phone] Fax: [phone]  Is this the correct pharmacy for this prescription? Yes If no, delete pharmacy and type the correct one.   Has the prescription been filled recently? No  Is the patient out of the medication? No  Has the patient been seen for an appointment in the last year OR does the patient have an upcoming appointment? Yes  Can we respond through MyChart? No, rather receive a call   Agent: Please be advised that Rx refills may take up to 3 business days. We ask that you follow-up with your pharmacy.

## 2023-12-11 NOTE — Telephone Encounter (Unsigned)
 Copied from CRM #[id number]. Topic: Clinical - Medication Refill >> Dec 11, 2023  1:22 PM Zy'onna H wrote: Medication: gabapentin  (NEURONTIN ) 300 MG capsule  Has the patient contacted their pharmacy? Yes (Agent: If no, request that the patient contact the pharmacy for the refill. If patient does not wish to contact the pharmacy document the reason why and proceed with request.) (Agent: If yes, when and what did the pharmacy advise?)  This is the patient's preferred pharmacy:   [id number] [address], [place] - [address] [place] [address] Phone: [phone] Fax: [phone]  Is this the correct pharmacy for this prescription? Yes If no, delete pharmacy and type the correct one.   Has the prescription been filled recently? Yes  Is the patient out of the medication? Yes  Has the patient been seen for an appointment in the last year OR does the patient have an upcoming appointment? Yes  Can we respond through MyChart? Yes  Agent: Please be advised that Rx refills may take up to 3 business days. We ask that you follow-up with your pharmacy.

## 2023-12-12 MED ORDER — GABAPENTIN 300 MG PO CAPS: ORAL_CAPSULE | ORAL | 0 refills | Status: DC

## 2023-12-16 NOTE — Telephone Encounter (Signed)
 Spoke to the patient and he is okay with going to PT.  He will call back as needed.

## 2023-12-17 ENCOUNTER — Ambulatory Visit

## 2023-12-17 DIAGNOSIS — R296 Repeated falls: Secondary | ICD-10-CM

## 2023-12-17 DIAGNOSIS — R2681 Unsteadiness on feet: Secondary | ICD-10-CM | POA: Diagnosis not present

## 2023-12-17 DIAGNOSIS — M5459 Other low back pain: Secondary | ICD-10-CM

## 2023-12-17 DIAGNOSIS — M6281 Muscle weakness (generalized): Secondary | ICD-10-CM

## 2023-12-17 NOTE — Therapy (Signed)
 OUTPATIENT PHYSICAL THERAPY TREATMENT   Patient Name: Charles Lloyd. MRN: [id number] DOB:1940/11/17, 83 y.o., male Today's Date: 12/17/2023  END OF SESSION:  PT End of Session - 12/17/23 1116     Visit Number 2    Date for Recertification  02/04/24    Authorization Type [employer] MCR 16 visits 10/14 to 12/9    Authorization - Visit Number 2    Authorization - Number of Visits 16    Progress Note Due on Visit 10    PT Start Time 1017    PT Stop Time 1100    PT Time Calculation (min) 43 min    Activity Tolerance Patient tolerated treatment well    Behavior During Therapy WFL for tasks assessed/performed           Past Medical History:  Diagnosis Date   Acute tracheobronchitis 01/05/2019   B12 deficiency    Basal cell carcinoma (BCC) of left side of nose 01/28/2018   Cancer (HCC)    Cardiomyopathy (HCC) 03/10/2018   With chronic atrial fibrillation   CHF (congestive heart failure) (HCC) 04/16/2013   Functional class II, ejection fraction 35-40%  Formatting of this note might be different from the original. Functional class II, ejection fraction 35-40%   Chronic anticoagulation    Chronic diastolic (congestive) heart failure (HCC) 04/16/2013   Functional class II, ejection fraction 35-40%  Last Assessment & Plan:  Clinically stable Volume well controlled meds reviewed   Chronic diastolic CHF (congestive heart failure) (HCC) 04/16/2013   Functional class II, ejection fraction 35-40%  Last Assessment & Plan:  Clinically stable Volume well controlled meds reviewed   Colon polyps 04/16/2013   Coronary artery disease involving native coronary artery of native heart without angina pectoris 04/05/2022   Myoview  05/25/2019: EF 60, normal perfusion, low risk CCTA (AF protocol) 04/25/20: CAC score 4242 (99th percentile)   Diabetic neuropathy (HCC)    Eosinophil count raised 02/17/2019   Essential hypertension 01/06/2010   Last Assessment & Plan:  Well controlled Continue med  management   Gout    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  Steroid usage was discontinued several months ago.  Follow up with Dermatology as planned.   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   Last Assessment & Plan:  Id following ?ongoing abx managementy reported Request records   Infection of prosthetic right knee joint    Insomnia 03/04/2012   Grief with loss of wife 02/20/12  Last Assessment & Plan:  Improved sx  contineu xanax prn Se discussed   Lesion of liver 04/20/2019   -On cardiac CT 02/2019   Mild reactive airways disease    Neoplasm of prostate 04/16/2013   Neoplasm of prostate, 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 Transthoracic Echocardiogram-03/10/2013-[facility]: Normal left ventricular wall thickness and cavity size.  Global left ventricular systolic  function is moderately reduced.  The estimated ejection fraction is 35-40%.  The left atrium is moderately enlarged.  The right atrium is mildly enlarged.  No significant valvular    Overweight (BMI 25.0-29.9) 10/22/2016   Persistent atrial fibrillation (HCC) 04/16/2013   Last Assessment & Plan:  Rate controlled Continue med management eliquis  for stroke preventino  Formatting of this note might be different from the original.  Drug  HX Current Rx  Pre-ABL inefficacy Pre-ABL intolerant Post-ABL inefficacy Post-ABL intolerant max dose/24h M/Y end comments  sotalol                  dofetilide                  flecainide                   propafenone                  am   S/P TKR (total knee replacement), bilateral    Secondary hypercoagulable state 04/09/2019   Tinea cruris 04/16/2013   Type 2 diabetes mellitus with diabetic polyneuropathy, without long-term current use of insulin  (HCC) 04/16/2013   Last Assessment & Plan:  Labs today Pt reports well controlled on ambulatory monitoring   Vitamin D  deficiency 07/25/2018   Past Surgical History:  Procedure Laterality Date   ATRIAL FIBRILLATION ABLATION N/A 03/12/2019   Procedure: ATRIAL FIBRILLATION ABLATION;  Surgeon: Inocencio Soyla Lunger, MD;  Location: [facility];  Service: Cardiovascular;  Laterality: N/A;   ATRIAL FIBRILLATION ABLATION N/A 04/29/2020   Procedure: ATRIAL FIBRILLATION ABLATION;  Surgeon: Inocencio Soyla Lunger, MD;  Location: [facility];  Service: Cardiovascular;  Laterality: N/A;   CARDIAC ELECTROPHYSIOLOGY STUDY AND ABLATION     CARDIOVERSION     CARDIOVERSION N/A 10/10/2018   Procedure: CARDIOVERSION;  Surgeon: Delford Maude BROCKS, MD;  Location: [facility] ENDOSCOPY;  Service: Cardiovascular;  Laterality: N/A;   CARDIOVERSION N/A 05/18/2020   Procedure: CARDIOVERSION;  Surgeon: Alveta Aleene PARAS, MD;  Location: [facility] ENDOSCOPY;  Service: Cardiovascular;  Laterality: N/A;   CARDIOVERSION N/A 12/09/2020   Procedure: CARDIOVERSION;  Surgeon: Pietro Redell RAMAN, MD;  Location: [facility] ENDOSCOPY;  Service: Cardiovascular;  Laterality: N/A;   CHOLECYSTECTOMY     PROSTATECTOMY     REPLACEMENT TOTAL KNEE BILATERAL     Patient Active Problem List   Diagnosis Date Noted   Bilateral leg pain 11/29/2023   Acute posthemorrhagic anemia 06/21/2022   Bilateral lower extremity edema 04/06/2022   Coronary artery disease involving native coronary artery of native heart without angina pectoris  04/05/2022   Gout 02/27/2022   Acute renal failure superimposed on stage 3b chronic kidney disease (HCC) 11/16/2021   Uremia 11/16/2021   Gout due to renal impairment 10/17/2021   Pancytopenia (HCC) 08/05/2021   SIRS (systemic inflammatory response syndrome) (HCC) 08/04/2021   Chronic kidney disease, stage 3b (HCC) 12/30/2020   Multiple falls    Spinal stenosis    S/P TKR (total knee replacement), bilateral    Arthralgia 11/20/2019   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   Lesion of liver 04/20/2019   Secondary hypercoagulable state 04/09/2019   Eosinophil count raised 02/17/2019   Acute tracheobronchitis 01/05/2019   Asthma    Vitamin D  deficiency 07/25/2018   Chronic anticoagulation 03/06/2018   Hyperlipidemia associated with type 2 diabetes mellitus (HCC) 01/28/2018   Basal cell carcinoma (BCC) of left side of nose 01/28/2018   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   Artificial  knee joint present 05/20/2017   Left knee pain 05/07/2017   B12 deficiency 10/22/2016   Overweight (BMI 25.0-29.9) 10/22/2016   Primary osteoarthritis of right knee 03/29/2015   Osteoarthritis of right knee 03/29/2015   History of cholecystectomy 11/20/2014   Occult blood in stools 02/09/2014   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   Paroxysmal A-fib (HCC) 04/16/2013   Chronic heart failure with preserved ejection fraction (HFpEF) (HCC) 04/16/2013   Colon polyps 04/16/2013   NIDDM-2 with polyneuropathy and hyperglycemia 04/16/2013   Tinea cruris 04/16/2013   Diabetic neuropathy (HCC) 07/14/2012   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) 07/14/2012   Insomnia 03/04/2012   History of cardiovascular disorder 08/16/2011   Hypertensive heart disease with heart failure (HCC) 01/06/2010   Neoplasm of prostate, malignant (HCC) 01/06/2010   Hypercholesterolemia 01/06/2010   Essential hypertension 01/06/2010    PCP: Theophilus Andrews, Tully GRADE, MD   REFERRING PROVIDER: Darnella Dorn SAUNDERS,  MD   REFERRING DIAG: [phone] (ICD-10-CM) - Spinal stenosis, lumbar region with neurogenic claudication   Rationale for Evaluation and Treatment: Rehabilitation  THERAPY DIAG:  Unsteadiness on feet  Muscle weakness (generalized)  Other low back pain  Repeated falls  ONSET DATE: September 2024  SUBJECTIVE:                                                                                                                                                                                           SUBJECTIVE STATEMENT: Exercises are ok with sitting, the sit to stand causes leg pain. I'm interested in trying dry needling.     Recent fall Friday morning. A lot of pain in his rear end since the fall in September. Since that time he sits a lot and has gotten weaker and weaker. Injections in back every 3 months. Would like to get back to walking short distances at [employer]. Feels taser shot in both legs intermittently. Hasn't slept in 3-4 weeks. Rolls over and feels it. Cannot stand and do dishes, cannot drive x 2 months. Has to be holding onto something for balance. Has chair lift at home.  Doing a bone density scan to see if surgery is viable. Trying chiropractor as well.   Accompanied by son  PERTINENT HISTORY:  Lumbar anterolisthesis, Peripheral neuropathy, TKR bilat, A-fib, CHF, DM   PAIN:  Are you having pain? Yes: NPRS scale: 5/10 Pain location: L  buttocks to post knees Pain description: taser Aggravating factors: unsure Relieving factors: nothing  PRECAUTIONS: Fall  RED FLAGS: None   WEIGHT BEARING RESTRICTIONS: No  FALLS:  Has patient fallen in last 6 months? Yes. Number of falls  5  LIVING ENVIRONMENT:  Lives with: lives alone and family is near by and can help him easily  Lives in: House/apartment Stairs: two story townhouse with stair lift inside, has one step going up to chair lift Has following equipment at home: Single point cane, Walker - 4 wheeled, and stair  lift, raised toilet seats, shower stool   OCCUPATION: retired  PLOF: Independent with basic ADLs  PATIENT GOALS: Get back to independence  NEXT MD VISIT:   OBJECTIVE:  Note: Objective measures were completed at Evaluation unless otherwise noted.  DIAGNOSTIC FINDINGS:  MRI 1. Lumbar spondylosis and degenerative disc disease, causing markedly severe impingement at L4-5; prominent impingement at L2-3; and moderate to prominent impingement at L3-4. This impingement is roughly similar to the prior MRI exam from 08/04/2021. 2. Transitional lumbosacral vertebra is assumed to represent the S1 level. This conforms to the segmentation utilized on prior exams. Careful correlation with this numbering strategy prior to any procedural intervention would be recommended.  PATIENT SURVEYS:  THE PATIENT SPECIFIC FUNCTIONAL SCALE  Place score of 0-10 (0 = unable to perform activity and 10 = able to perform activity at the same level as before injury or problem)  Activity Date: 12/10/23    Standing to do dishes 0    2. Sleeping nightly  2    3 . Shopping 1x/wk 0    4.      Total Score 0.7      Total Score = Sum of activity scores/number of activities  Minimally Detectable Change: 3 points (for single activity); 2 points (for average score)  [facility] (nd). The Patient Specific Functional Scale . Retrieved from [email or web address]   COGNITION: Overall cognitive status: Within functional limits for tasks assessed     SENSATION: Neuropathy from knees down B  MUSCLE LENGTH: NT  POSTURE: rounded shoulders, forward head, and flexed trunk   PALPATION: Left gluteals piriformis  LUMBAR ROM: pain with neutral spine, flexion WFL  LOWER EXTREMITY ROM:   WNL for tasks assessed    Right eval Left eval  Hip flexion    Hip extension    Hip abduction    Hip adduction    Hip internal rotation    Hip external rotation     Knee flexion    Knee extension    Ankle dorsiflexion    Ankle plantarflexion    Ankle inversion    Ankle eversion     (Blank rows = not tested)  LOWER EXTREMITY MMT:    MMT Right eval Left eval  Hip flexion 3 3+  Hip extension    Hip abduction 4 4-  Hip adduction 4+ 4+  Hip internal rotation    Hip external rotation    Knee flexion 4 4+  Knee extension 4+ 5  Ankle dorsiflexion 3- 3-  Ankle plantarflexion    Ankle inversion    Ankle eversion     (Blank rows = not tested)    FUNCTIONAL TESTS:  5 times sit to stand: 10.03 sec with B UE support and does not hold the stand (falls bwd) Timed up and go (TUG): 17.9 sec  GAIT: Distance walked: 40 Assistive device utilized: Environmental consultant - 4 wheeled Level of assistance: Modified independence and SBA Comments: flexed trunk, one incidence of catching his toe  TREATMENT DATE:  12/17/23:  Seated marching: 2x10 Seated hip abduction with green band 2x10 Sit to stand with pad in seat 2x5 Long arc quads: 5 hold x10 each  Nustep Level 5x 6 minutes-PT present to discuss progress Seated hamstring stretch- demo only  Trigger Point Dry Needling  Initial Treatment: Pt instructed on Dry Needling rational, procedures, and possible side effects. Pt instructed to expect mild to moderate muscle soreness later in the day and/or into the next day.  Pt instructed in methods to reduce muscle soreness. Pt instructed to continue prescribed HEP. Patient was educated on signs and symptoms of infection and other risk factors and advised to seek medical attention should they occur.  Patient verbalized understanding of these instructions and education.   Patient Verbal Consent Given: Yes Education Handout Provided: Yes Muscles Treated: Lt gluteals  Electrical Stimulation Performed: No Treatment Response/Outcome: Utilized  skilled palpation to identify bony landmarks and trigger points.  Able to illicit twitch response and muscle elongation.  Soft tissue mobilization to muscles needled following DN to further promote tissue elongation and decreased pain.     12/10/23  See pt ed and HEP   PATIENT EDUCATION:  Education details: PT eval findings, anticipated POC, initial HEP, gait safety with RW at all times, role of TPDN, and discussion of stenosis and posture and possible trial of aquatics.   Person educated: Patient and Child(ren) Education method: Explanation, Demonstration, Tactile cues, Verbal cues, and Handouts Education comprehension: verbalized understanding and returned demonstration  HOME EXERCISE PROGRAM: Access Code: [email or web address] URL: [email or web address] Date: 12/17/2023 Prepared by: Burnard  Exercises - Sit to Stand with Counter Support  - 3 x daily - 7 x weekly - 1 sets - 10 reps - Seated Hip Abduction with Resistance  - 1 x daily - 7 x weekly - 2 sets - 10 reps - Seated March  - 1 x daily - 7 x weekly - 2 sets - 10 reps - Seated Long Arc Quad  - 2 x daily - 7 x weekly - 1 sets - 10 reps - 5 hold - Seated Hamstring Stretch  - 3 x daily - 7 x weekly - 1 sets - 3 reps - 20 hold ASSESSMENT:  CLINICAL IMPRESSION: First time follow-up after evaluation. He reports significant pain with sit to stand transition.  PT added foam pad and advised he elevate seat hight with this at home.  He is not able to perform with good form today and presses heavily on the backs of his legs. Good response to dry needling to Lt gluteals with improved tissue mobility and twitch response after.  Pt reports reduced Lt palpable tenderness after treatment.  He will benefit from skilled PT to address these deficits and those listed below.   OBJECTIVE IMPAIRMENTS: decreased activity tolerance, decreased balance, decreased endurance, difficulty walking, decreased ROM, decreased strength, decreased safety awareness,  increased muscle spasms, impaired flexibility, postural dysfunction, and pain.   ACTIVITY LIMITATIONS: standing, sleeping, stairs, transfers, bed mobility, bathing, and locomotion level  PARTICIPATION LIMITATIONS: meal prep, cleaning, laundry, driving, shopping, and community activity  PERSONAL FACTORS: Age, Fitness, Time since onset of injury/illness/exacerbation, and 3+ comorbidities:  Peripheral neuropathy, TKR bilat, A-fib, CHF, DM are also affecting patient's functional outcome.   REHAB POTENTIAL: Good  CLINICAL DECISION MAKING: Evolving/moderate complexity  EVALUATION COMPLEXITY: Moderate   GOALS: Goals reviewed with patient? Yes  SHORT TERM GOALS: Target date: 01/07/2024   Patient will be independent with initial HEP.  Baseline:  Goal status: INITIAL  2.  Patient will report decreased left hip pain with sleeping by 50%.  Baseline:  Goal status: INITIAL  3.  Patient compliant with use of 4WW at home on main floor. Baseline:  Goal status: INITIAL   LONG TERM GOALS: Target date: 02/04/2024   Patient will be independent with advanced/ongoing HEP to improve outcomes and carryover.  Baseline:  Goal status: INITIAL  2.  Patient will report 70% improvement in pain with ADLs to improve QOL.  Baseline:  Goal status: INITIAL  3.  Patient will able to step up on 8 inch step with one UE assist to reach stair lift easier. Baseline:  Goal status: INITIAL  4.  Patient will demonstrate improved functional strength by being able to complete 5XSTS without UE assist. Baseline:  Goal status: INITIAL  5.  Patient will score 3 or higher on PSFS demonstrating improved functional ability.  Baseline: 0.7 Goal status: INITIAL  6.  Patient able to stand to do his dishes .  Baseline:  Goal status: INITIAL     PLAN:  PT FREQUENCY: 2x/week  PT DURATION: 8 weeks  PLANNED INTERVENTIONS: 97164- PT Re-evaluation, 97110-Therapeutic exercises, 97530- Therapeutic activity, 97112-  Neuromuscular re-education, 97535- Self Care, 02859- Manual therapy, [phone]- Gait training, [phone]- Aquatic Therapy, [phone]- Electrical stimulation (unattended), [id number]- Ultrasound, [id number]- Ionotophoresis 4mg /ml Dexamethasone , 79439 (1-2 muscles), 20561 (3+ muscles)- Dry Needling, Patient/Family education, Balance training, Stair training, Taping, Joint mobilization, Spinal mobilization, DME instructions, Cryotherapy, and Moist heat.  PLAN FOR NEXT SESSION: Review and progress HEP, assess response to dry needling and repeat and include Rt gluteals if needed, work on sit to stands, 8 inch step ups, gait, balance   Burnard Joy, PT 12/17/23 11:18 AM

## 2023-12-17 NOTE — Patient Instructions (Signed)
 Trigger Point Dry Needling  What is Trigger Point Dry Needling (DN)? DN is a physical therapy technique used to treat muscle pain and dysfunction. Specifically, DN helps deactivate muscle trigger points (muscle knots).  A thin filiform needle is used to penetrate the skin and stimulate the underlying trigger point. The goal is for a local twitch response (LTR) to occur and for the trigger point to relax. No medication of any kind is injected during the procedure.   What Does Trigger Point Dry Needling Feel Like?  The procedure feels different for each individual patient. Some patients report that they do not actually feel the needle enter the skin and overall the process is not painful. Very mild bleeding may occur. However, many patients feel a deep cramping in the muscle in which the needle was inserted. This is the local twitch response.   How Will I feel after the treatment? Soreness is normal, and the onset of soreness may not occur for a few hours. Typically this soreness does not last longer than two days.  Bruising is uncommon, however; ice can be used to decrease any possible bruising.  In rare cases feeling tired or nauseous after the treatment is normal. In addition, your symptoms may get worse before they get better, this period will typically not last longer than 24 hours.   What Can I do After My Treatment? Increase your hydration by drinking more water for the next 24 hours.  You may place ice or heat on the areas treated that have become sore, however, do not use heat on inflamed or bruised areas. Heat often brings more relief post needling. You can continue your regular activities, but vigorous activity is not recommended initially after the treatment for 24 hours. DN is best combined with other physical therapy such as strengthening, stretching, and other therapies.   What are the complications? While your therapist has had extensive training in minimizing the risks of trigger  point dry needling, it is important to understand the risks of any procedure.  Risks include bleeding, pain, fatigue, hematoma, infection, vertigo, nausea or nerve involvement. Monitor for any changes to your skin or sensation. Contact your therapist or MD with concerns.  A rare but serious complication is a pneumothorax over or near your middle and upper chest and back If you have dry needling in this area, monitor for the following symptoms: Shortness of breath on exertion and/or Difficulty taking a deep breath and/or Chest Pain and/or A dry cough If any of the above symptoms develop, please go to the nearest emergency room or call 911. Tell them you had dry needling over your thorax and report any symptoms you are having. Please follow-up with your treating therapist after you complete the medical evaluation.   [facility]  [address] [place] [address].  [phone]

## 2023-12-25 ENCOUNTER — Ambulatory Visit

## 2023-12-25 ENCOUNTER — Telehealth: Payer: Self-pay

## 2023-12-25 ENCOUNTER — Ambulatory Visit (INDEPENDENT_AMBULATORY_CARE_PROVIDER_SITE_OTHER)

## 2023-12-25 DIAGNOSIS — M48062 Spinal stenosis, lumbar region with neurogenic claudication: Secondary | ICD-10-CM

## 2023-12-25 NOTE — Telephone Encounter (Signed)
 Called and spoke to patient.  He thought his appt was 12/26/23 and is unable to get a ride today.  We put him on the waitlist for 12/26/23 and will notify him if there is an opening.

## 2023-12-30 ENCOUNTER — Encounter: Payer: Self-pay | Admitting: Radiology

## 2023-12-30 ENCOUNTER — Ambulatory Visit

## 2023-12-30 ENCOUNTER — Other Ambulatory Visit: Payer: Self-pay | Admitting: Internal Medicine

## 2023-12-31 ENCOUNTER — Telehealth: Payer: Self-pay

## 2023-12-31 ENCOUNTER — Telehealth: Payer: Self-pay | Admitting: Orthopaedic Surgery

## 2023-12-31 NOTE — Telephone Encounter (Signed)
 Pt called stating that he received a call from our office to schedule an appt. Says he doesn't need an appt. Pt call back number is [phone]

## 2023-12-31 NOTE — Telephone Encounter (Signed)
   Pre-operative Risk Assessment    Patient Name: Charles Lloyd.  DOB: [birth year] MRN: [id number]   Date of last office visit: 06/04/23 Date of next office visit: N/A   Request for Surgical Clearance    Procedure:  Lumbar Fusion  Date of Surgery:  Clearance TBD                                Surgeon:  Dr. Dorn Glade Surgeon's Group or Practice Name:  [facility] Phone number:  [phone] Fax number:  [phone]   Type of Clearance Requested:   - Medical    Type of Anesthesia:  General    Additional requests/questions:    [name]   12/31/2023, 4:11 PM

## 2023-12-31 NOTE — Telephone Encounter (Signed)
 Pre-op clearance scheduled for 01/10/24 with Josefa Beauvais, NP.       Patient Consent for Virtual Visit        Charles Lloyd. has provided verbal consent on 12/31/2023 for a virtual visit (video or telephone).   CONSENT FOR VIRTUAL VISIT FOR:  Charles Lloyd.  By participating in this virtual visit I agree to the following:  I hereby voluntarily request, consent and authorize [facility] and its employed or contracted physicians, physician assistants, nurse practitioners or other licensed health care professionals (the Practitioner), to provide me with telemedicine health care services (the "Services) as deemed necessary by the treating Practitioner. I acknowledge and consent to receive the Services by the Practitioner via telemedicine. I understand that the telemedicine visit will involve communicating with the Practitioner through live audiovisual communication technology and the disclosure of certain medical information by electronic transmission. I acknowledge that I have been given the opportunity to request an in-person assessment or other available alternative prior to the telemedicine visit and am voluntarily participating in the telemedicine visit.  I understand that I have the right to withhold or withdraw my consent to the use of telemedicine in the course of my care at any time, without affecting my right to future care or treatment, and that the Practitioner or I may terminate the telemedicine visit at any time. I understand that I have the right to inspect all information obtained and/or recorded in the course of the telemedicine visit and may receive copies of available information for a reasonable fee.  I understand that some of the potential risks of receiving the Services via telemedicine include:  Delay or interruption in medical evaluation due to technological equipment failure or disruption; Information transmitted may not be sufficient (e.g. poor  resolution of images) to allow for appropriate medical decision making by the Practitioner; and/or  In rare instances, security protocols could fail, causing a breach of personal health information.  Furthermore, I acknowledge that it is my responsibility to provide information about my medical history, conditions and care that is complete and accurate to the best of my ability. I acknowledge that Practitioner's advice, recommendations, and/or decision may be based on factors not within their control, such as incomplete or inaccurate data provided by me or distortions of diagnostic images or specimens that may result from electronic transmissions. I understand that the practice of medicine is not an exact science and that Practitioner makes no warranties or guarantees regarding treatment outcomes. I acknowledge that a copy of this consent can be made available to me via my patient portal [facility] MyChart), or I can request a printed copy by calling the office of [facility].    I understand that my insurance will be billed for this visit.   I have read or had this consent read to me. I understand the contents of this consent, which adequately explains the benefits and risks of the Services being provided via telemedicine.  I have been provided ample opportunity to ask questions regarding this consent and the Services and have had my questions answered to my satisfaction. I give my informed consent for the services to be provided through the use of telemedicine in my medical care

## 2023-12-31 NOTE — Telephone Encounter (Signed)
   Name: Charles Lloyd.  DOB: 1940-11-14  MRN: [id number]  Primary Cardiologist: Redell Leiter, MD   Preoperative team, please contact this patient and set up a phone call appointment for further preoperative risk assessment. Please obtain consent and complete medication review. Thank you for your help.  No request for pharmacy, he is on Eliquis , which is managed by his PCP.  I also confirmed the patient resides in the state of [place] . As per [facility] Medical Board telemedicine laws, the patient must reside in the state in which the provider is licensed.   [name], NP 12/31/2023, 4:26 PM [facility]

## 2024-01-01 ENCOUNTER — Ambulatory Visit: Admitting: Physical Therapy

## 2024-01-01 ENCOUNTER — Encounter: Admitting: Orthopedic Surgery

## 2024-01-01 ENCOUNTER — Ambulatory Visit: Admitting: Internal Medicine

## 2024-01-03 ENCOUNTER — Telehealth: Payer: Self-pay | Admitting: *Deleted

## 2024-01-03 ENCOUNTER — Ambulatory Visit: Attending: Cardiology | Admitting: Cardiology

## 2024-01-03 DIAGNOSIS — Z01818 Encounter for other preprocedural examination: Secondary | ICD-10-CM

## 2024-01-03 DIAGNOSIS — Z01810 Encounter for preprocedural cardiovascular examination: Secondary | ICD-10-CM | POA: Diagnosis not present

## 2024-01-03 NOTE — Telephone Encounter (Signed)
 Patient appointment has been changed to this afternoon at 4pm. Patient aware.

## 2024-01-03 NOTE — Telephone Encounter (Signed)
 Copied from CRM [id number]. Topic: General - Other >> Jan 03, 2024 11:52 AM Amy B wrote: Reason for CRM: Patient states Dr. Darnella who is a spine surgeon, requests medical clearance prior to surgery.  They are unable to schedule the surgery without it.  Please advise.

## 2024-01-03 NOTE — Telephone Encounter (Signed)
 Please advise holding Eliquis  prior to Lumbar fusion. Tele visit today, 11/7.  Last labs September 2025.   Thank you!  DW

## 2024-01-03 NOTE — Telephone Encounter (Signed)
 Patient called to see if appt could be pushed up sooner, states he is in a lot of pain.

## 2024-01-03 NOTE — Progress Notes (Signed)
 Virtual Visit via Telephone Note   Because of Ell Tiso. co-morbid illnesses, he is at least at moderate risk for complications without adequate follow up.  This format is felt to be most appropriate for this patient at this time.  Due to technical limitations with video connection (technology), today's appointment will be conducted as an audio only telehealth visit, and Javonni Macke. verbally agreed to proceed in this manner.   All issues noted in this document were discussed and addressed.  No physical exam could be performed with this format.  Evaluation Performed:  Preoperative cardiovascular risk assessment _____________   Date:  01/03/2024   Patient ID:  Charles GORMAN Juanito Mickey., DOB January 19, 1941, MRN [id number] Patient Location:  Home Provider location:   Office  Primary Care Provider:  Theophilus Andrews, Tully GRADE, MD Primary Cardiologist:  Lamar Fitch, MD  Chief Complaint / Patient Profile   83 y.o. y/o male with a h/o paroxysmal atrial fibrillation s/p multiple ablations >> on flecainide  and Eliquis , coronary artery disease without interventions, hypertension who is pending lumbar fusion and presents today for telephonic preoperative cardiovascular risk assessment.  History of Present Illness    Charles Zeitler. is a 83 y.o. male who presents via audio/video conferencing for a telehealth visit today.  Pt was last seen in cardiology clinic on 06/04/2023 by Dr. Fitch.  At that time Charles Logan. was doing well but dealing with ongoing back pain.  The patient is now pending procedure as outlined above. Since his last visit, he continues to do well cardiac wise, mentions he can typically play golf travel do all of his own chores inside and outside his house however is dealing with debilitating back pain. He denies chest pain, palpitations, dyspnea, pnd, orthopnea, n, v, dizziness, syncope, edema, weight gain, or early satiety.    Past Medical History    Past  Medical History:  Diagnosis Date   Acute tracheobronchitis 01/05/2019   B12 deficiency    Basal cell carcinoma (BCC) of left side of nose 01/28/2018   Cancer (HCC)    Cardiomyopathy (HCC) 03/10/2018   With chronic atrial fibrillation   CHF (congestive heart failure) (HCC) 04/16/2013   Functional class II, ejection fraction 35-40%  Formatting of this note might be different from the original. Functional class II, ejection fraction 35-40%   Chronic anticoagulation    Chronic diastolic (congestive) heart failure (HCC) 04/16/2013   Functional class II, ejection fraction 35-40%  Last Assessment & Plan:  Clinically stable Volume well controlled meds reviewed   Chronic diastolic CHF (congestive heart failure) (HCC) 04/16/2013   Functional class II, ejection fraction 35-40%  Last Assessment & Plan:  Clinically stable Volume well controlled meds reviewed   Colon polyps 04/16/2013   Coronary artery disease involving native coronary artery of native heart without angina pectoris 04/05/2022   Myoview  05/25/2019: EF 60, normal perfusion, low risk CCTA (AF protocol) 04/25/20: CAC score 4242 (99th percentile)   Diabetic neuropathy (HCC)    Eosinophil count raised 02/17/2019   Essential hypertension 01/06/2010   Last Assessment & Plan:  Well controlled Continue med management   Gout    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  Steroid usage was discontinued several months ago.  Follow up with Dermatology as planned.   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   Last Assessment & Plan:  Id following ?ongoing abx managementy reported  Request records   Infection of prosthetic right knee joint    Insomnia 03/04/2012   Grief with loss of wife 02/20/12  Last Assessment & Plan:  Improved sx  contineu xanax prn Se discussed   Lesion of liver 04/20/2019   -On cardiac CT 02/2019   Mild reactive airways disease    Neoplasm of prostate 04/16/2013   Neoplasm of prostate, 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 Transthoracic Echocardiogram-03/10/2013-[facility]: Normal left ventricular wall thickness and cavity size.  Global left ventricular systolic function is moderately reduced.  The estimated ejection fraction is 35-40%.  The left atrium is moderately enlarged.  The right atrium is mildly enlarged.  No significant valvular    Overweight (BMI 25.0-29.9) 10/22/2016   Persistent atrial fibrillation (HCC) 04/16/2013   Last Assessment & Plan:  Rate controlled Continue med management eliquis  for stroke preventino  Formatting of this note might be different from the original.  Drug  HX Current Rx Pre-ABL inefficacy Pre-ABL intolerant Post-ABL inefficacy Post-ABL intolerant max dose/24h M/Y end comments  sotalol                  dofetilide                  flecainide                   propafenone                  am   S/P TKR (total knee replacement), bilateral    Secondary hypercoagulable state 04/09/2019   Tinea cruris 04/16/2013   Type 2 diabetes mellitus with diabetic polyneuropathy, without long-term current use of insulin  (HCC) 04/16/2013   Last Assessment & Plan:  Labs today Pt reports well controlled on ambulatory monitoring   Vitamin D  deficiency 07/25/2018   Past Surgical History:  Procedure Laterality Date   ATRIAL  FIBRILLATION ABLATION N/A 03/12/2019   Procedure: ATRIAL FIBRILLATION ABLATION;  Surgeon: Inocencio Soyla Lunger, MD;  Location: [facility] INVASIVE CV LAB;  Service: Cardiovascular;  Laterality: N/A;   ATRIAL FIBRILLATION ABLATION N/A 04/29/2020   Procedure: ATRIAL FIBRILLATION ABLATION;  Surgeon: Inocencio Soyla Lunger, MD;  Location: [facility] INVASIVE CV LAB;  Service: Cardiovascular;  Laterality: N/A;   CARDIAC ELECTROPHYSIOLOGY STUDY AND ABLATION     CARDIOVERSION     CARDIOVERSION N/A 10/10/2018   Procedure: CARDIOVERSION;  Surgeon: Delford Maude BROCKS, MD;  Location[facility] ENDOSCOPY;  Service: Cardiovascular;  Laterality: N/A;   CARDIOVERSION N/A 05/18/2020   Procedure: CARDIOVERSION;  Surgeon: Alveta Aleene PARAS, MD;  Location: [facility] ENDOSCOPY;  Service: Cardiovascular;  Laterality: N/A;   CARDIOVERSION N/A 12/09/2020   Procedure: CARDIOVERSION;  Surgeon: Pietro Redell RAMAN, MD;  Location: [facility] ENDOSCOPY;  Service: Cardiovascular;  Laterality: N/A;   CHOLECYSTECTOMY     PROSTATECTOMY     REPLACEMENT TOTAL KNEE BILATERAL      Allergies  Allergies  Allergen Reactions  Amlodipine  Other (See Comments)    Severe edema, chest pain, nausea     Home Medications    Prior to Admission medications   Medication Sig Start Date End Date Taking? Authorizing Provider  ACCU-CHEK AVIVA PLUS test strip 1 EACH BY OTHER ROUTE DAILY. DX E11.9 Patient taking differently: 2 each by Other route daily. Dx E11.9 08/28/21   Theophilus Andrews, Tully GRADE, MD  albuterol  (VENTOLIN  HFA) 108 [phone] Base) MCG/ACT inhaler Inhale 1-2 puffs into the lungs every 6 (six) hours as needed for wheezing or shortness of breath. 10/05/20   Parrett, Madelin RAMAN, NP  allopurinol  (ZYLOPRIM ) 300 MG tablet TAKE 1 TABLET BY MOUTH EVERY DAY 10/14/23   Theophilus Andrews, Tully GRADE, MD  apixaban  (ELIQUIS ) 5 MG TABS tablet Take 1 tablet (5 mg total) by mouth 2 (two) times daily. 08/02/23   Theophilus Andrews, Tully GRADE, MD  atorvastatin  (LIPITOR) 20 MG tablet TAKE 1 TABLET DAILY  (PLEASE SCHEDULE A PHYSICAL FOR MORE REFILLS) 12/09/23   Panosh, Apolinar POUR, MD  carvedilol  (COREG ) 3.125 MG tablet TAKE 1 TABLET BY MOUTH TWICE A DAY 12/31/23   Theophilus Andrews, Tully GRADE, MD  cephALEXin  (KEFLEX ) 500 MG capsule Take 1 capsule (500 mg total) by mouth 3 (three) times daily. 08/23/23   Hoy Nidia FALCON, PA-C  colchicine  0.6 MG tablet Take 1 tablet (0.6 mg total) by mouth 2 (two) times daily. 01/08/22   Theophilus Andrews, Tully GRADE, MD  Continuous Blood Gluc Receiver (FREESTYLE LIBRE 2 READER) DEVI 1 each by Does not apply route daily. 08/28/21   Theophilus Andrews, Tully GRADE, MD  Continuous Blood Gluc Sensor (FREESTYLE LIBRE 2 SENSOR) MISC 1 each by Does not apply route daily. 08/28/21   Theophilus Andrews, Tully GRADE, MD  cyclobenzaprine  (FLEXERIL ) 10 MG tablet Take 0.5 tablets (5 mg total) by mouth 2 (two) times daily as needed for muscle spasms. 09/22/23   Simon Lavonia SAILOR, MD  diclofenac  Sodium (VOLTAREN ) 1 % GEL Apply 4 g topically 4 (four) times daily. 10/30/23   Emil Share, DO  doxycycline  (VIBRA -TABS) 100 MG tablet TAKE 1 TABLET BY MOUTH TWICE A DAY 09/23/23   Vernetta Lonni GRADE, MD  flecainide  (TAMBOCOR ) 100 MG tablet TAKE 1 TABLET BY MOUTH TWICE A DAY 10/24/23   Camnitz, Soyla Lunger, MD  fluticasone  (FLONASE ) 50 MCG/ACT nasal spray Place 2 sprays into both nostrils daily. 01/07/19   Rosario Leatrice FERNS, MD  fluticasone  furoate-vilanterol (BREO ELLIPTA ) 100-25 MCG/INH AEPB Inhale 1 puff into the lungs daily. 10/05/20   Parrett, Madelin RAMAN, NP  gabapentin  (NEURONTIN ) 300 MG capsule Take one tablet in the morning, one tablet in the afternoon, and 2 tablets at bedtime 12/12/23   Theophilus Andrews, Tully GRADE, MD  hydrALAZINE  (APRESOLINE ) 25 MG tablet TAKE 1 TABLET (25 MG TOTAL) BY MOUTH 4 (FOUR) TIMES DAILY. 10/22/23   Krasowski, Robert J, MD  HYDROcodone -acetaminophen  (NORCO/VICODIN) 5-325 MG tablet Take 1-2 tablets by mouth every 6 (six) hours as needed for moderate pain (pain score 4-6). 11/26/23    Theophilus Andrews, Tully GRADE, MD  insulin  glargine (LANTUS  SOLOSTAR) 100 UNIT/ML Solostar Pen Inject 8 Units into the skin daily. 09/29/21   Burchette, Wolm ORN, MD  irbesartan  (AVAPRO ) 150 MG tablet Take 1 tablet (150 mg total) by mouth daily. 12/10/22   Camnitz, Soyla Lunger, MD  lidocaine  (LIDODERM ) 5 % Place 1 patch onto the skin daily. Remove & Discard patch within 12 hours or as directed by MD 09/22/23   Simon Lavonia SAILOR, MD  meloxicam  (MOBIC ) 7.5 MG tablet TAKE 1 TABLET BY MOUTH EVERY DAY 08/13/23   Theophilus Andrews, Tully GRADE, MD  methocarbamol  (ROBAXIN ) 500 MG tablet Take 1 tablet (500 mg total) by mouth 2 (two) times daily. 10/30/23   Emil Share, DO  methylPREDNISolone  (MEDROL  DOSEPAK) 4 MG TBPK tablet Take as directed on package. 10/30/23   Emil Share, DO  montelukast  (SINGULAIR ) 10 MG tablet Take 10 mg by mouth at bedtime.     [provider]  potassium chloride  SA (KLOR-CON  M20) 20 MEQ tablet Take 1 tablet (20 mEq total) by mouth daily. 09/18/23   Theophilus Andrews, Tully GRADE, MD  RESTASIS  0.05 % ophthalmic emulsion Place 1 drop into both eyes 2 (two) times daily as needed (dry eyes). 12/09/19   [provider]  torsemide  (DEMADEX ) 20 MG tablet Take 1 tablet (20 mg total) by mouth 2 (two) times daily. 05/03/23   Bernie Lamar PARAS, MD    Physical Exam    Vital Signs:  Charles GORMAN Juanito Mickey. does not have vital signs available for review today.  Given telephonic nature of communication, physical exam is limited. AAOx3. NAD. Normal affect.  Speech and respirations are unlabored.  Accessory Clinical Findings    None  Assessment & Plan    1.  Preoperative Cardiovascular Risk Assessment: According to the Revised Cardiac Risk Index (RCRI), his Perioperative Risk of Major Cardiac Event is (%): 0.4 His Functional Capacity in METs is: 6.05 according to the [facility] Activity Status Index (DASI). Therefore, based on ACC/AHA guidelines, patient would be at acceptable risk for the planned procedure  without further cardiovascular testing. I will route this recommendation to the requesting party via Epic fax function.   The patient was advised that if he develops new symptoms prior to surgery to contact our office to arrange for a follow-up visit, and he verbalized understanding.  Per office protocol, patient can hold Eliquis  for 3 days prior to procedure.      A copy of this note will be routed to requesting surgeon.  Time:   Today, I have spent 10 minutes with the patient with telehealth technology discussing medical history, symptoms, and management plan.     [name], NP  01/03/2024, 3:55 PM

## 2024-01-03 NOTE — Telephone Encounter (Signed)
 Patient with diagnosis of afib on Eliquis  for anticoagulation.    Procedure: Lumbar Fusion  Date of procedure: TBD   CHA2DS2-VASc Score = 6   This indicates a 9.7% annual risk of stroke. The patient's score is based upon: CHF History: 1 HTN History: 1 Diabetes History: 1 Stroke History: 0 Vascular Disease History: 1 Age Score: 2 Gender Score: 0      CrCl 72 ml/min Platelet count 175  Patient has not had an Afib/aflutter ablation in the last 3 months, DCCV within the last 4 weeks or a watchman implanted in the last 45 days   Per office protocol, patient can hold Eliquis  for 3 days prior to procedure.    **This guidance is not considered finalized until pre-operative APP has relayed final recommendations.**

## 2024-01-06 ENCOUNTER — Telehealth: Payer: Self-pay | Admitting: *Deleted

## 2024-01-06 NOTE — Telephone Encounter (Signed)
 Left message on machine for Dr. Shelbie surgical scheduler to find out of clearance is needed from Dr Theophilus.

## 2024-01-06 NOTE — Telephone Encounter (Signed)
 Copied from CRM [id number]. Topic: General - Other >> Jan 06, 2024 10:56 AM Aleatha BROCKS wrote: Reason for CRM: Spine Dr is waiting for Dr portia to release him so he can get the operation

## 2024-01-07 ENCOUNTER — Other Ambulatory Visit: Payer: Self-pay

## 2024-01-07 ENCOUNTER — Telehealth: Payer: Self-pay | Admitting: *Deleted

## 2024-01-07 ENCOUNTER — Ambulatory Visit

## 2024-01-07 DIAGNOSIS — M6281 Muscle weakness (generalized): Secondary | ICD-10-CM | POA: Diagnosis present

## 2024-01-07 DIAGNOSIS — M5459 Other low back pain: Secondary | ICD-10-CM | POA: Insufficient documentation

## 2024-01-07 DIAGNOSIS — R296 Repeated falls: Secondary | ICD-10-CM | POA: Insufficient documentation

## 2024-01-07 DIAGNOSIS — R2681 Unsteadiness on feet: Secondary | ICD-10-CM | POA: Insufficient documentation

## 2024-01-07 NOTE — Therapy (Signed)
 OUTPATIENT PHYSICAL THERAPY TREATMENT   Patient Name: Charles Lloyd. MRN: [id number] DOB:[birth year], 83 y.o., male Today's Date: 01/07/2024  END OF SESSION:  PT End of Session - 01/07/24 1149     Visit Number 3    Date for Recertification  02/04/24    Authorization Type [employer] MCR 16 visits 10/14 to 12/9    Authorization - Visit Number 3    Authorization - Number of Visits 16    Progress Note Due on Visit 10    PT Start Time 1103    PT Stop Time 1146    PT Time Calculation (min) 43 min    Activity Tolerance Patient tolerated treatment well    Behavior During Therapy WFL for tasks assessed/performed            Past Medical History:  Diagnosis Date   Acute tracheobronchitis 01/05/2019   B12 deficiency    Basal cell carcinoma (BCC) of left side of nose 01/28/2018   Cancer (HCC)    Cardiomyopathy (HCC) 03/10/2018   With chronic atrial fibrillation   CHF (congestive heart failure) (HCC) 04/16/2013   Functional class II, ejection fraction 35-40%  Formatting of this note might be different from the original. Functional class II, ejection fraction 35-40%   Chronic anticoagulation    Chronic diastolic (congestive) heart failure (HCC) 04/16/2013   Functional class II, ejection fraction 35-40%  Last Assessment & Plan:  Clinically stable Volume well controlled meds reviewed   Chronic diastolic CHF (congestive heart failure) (HCC) 04/16/2013   Functional class II, ejection fraction 35-40%  Last Assessment & Plan:  Clinically stable Volume well controlled meds reviewed   Colon polyps 04/16/2013   Coronary artery disease involving native coronary artery of native heart without angina pectoris 04/05/2022   Myoview  05/25/2019: EF 60, normal perfusion, low risk CCTA (AF protocol) 04/25/20: CAC score 4242 (99th percentile)   Diabetic neuropathy (HCC)    Eosinophil count raised 02/17/2019   Essential hypertension 01/06/2010   Last Assessment & Plan:  Well controlled Continue med  management   Gout    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  Steroid usage was discontinued several months ago.  Follow up with Dermatology as planned.   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   Last Assessment & Plan:  Id following ?ongoing abx managementy reported Request records   Infection of prosthetic right knee joint    Insomnia 03/04/2012   Grief with loss of wife 02/20/12  Last Assessment & Plan:  Improved sx  contineu xanax prn Se discussed   Lesion of liver 04/20/2019   -On cardiac CT 02/2019   Mild reactive airways disease    Neoplasm of prostate 04/16/2013   Neoplasm of prostate, 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 Transthoracic Echocardiogram-03/10/2013-[facility]: Normal left ventricular wall thickness and cavity size.  Global left ventricular  systolic function is moderately reduced.  The estimated ejection fraction is 35-40%.  The left atrium is moderately enlarged.  The right atrium is mildly enlarged.  No significant valvular    Overweight (BMI 25.0-29.9) 10/22/2016   Persistent atrial fibrillation (HCC) 04/16/2013   Last Assessment & Plan:  Rate controlled Continue med management eliquis  for stroke preventino  Formatting of this note might be different from the original.  Drug  HX Current Rx  Pre-ABL inefficacy Pre-ABL intolerant Post-ABL inefficacy Post-ABL intolerant max dose/24h M/Y end comments  sotalol                  dofetilide                  flecainide                   propafenone                  am   S/P TKR (total knee replacement), bilateral    Secondary hypercoagulable state 04/09/2019   Tinea cruris 04/16/2013   Type 2 diabetes mellitus with diabetic polyneuropathy, without long-term current use of insulin  (HCC) 04/16/2013   Last Assessment & Plan:  Labs today Pt reports well controlled on ambulatory monitoring   Vitamin D  deficiency 07/25/2018   Past Surgical History:  Procedure Laterality Date   ATRIAL FIBRILLATION ABLATION N/A 03/12/2019   Procedure: ATRIAL FIBRILLATION ABLATION;  Surgeon: Inocencio Soyla Lunger, MD;  Location: [facility] IN[facility]IVE [facility];  Service: Cardiovascular;  Laterality: N/A;   ATRIAL FIBRILLATION ABLATION N/A 04/29/2020   Procedure: ATRIAL FIBRILLATION ABLATION;  Surgeon: Inocencio Soyla Lunger, MD;  Location: [facility];  Service: Cardiovascular;  Laterality: N/A;   CARDIAC ELECTROPHYSIOLOGY STUDY AND ABLATION     CARDIOVERSION     CARDIOVERSION N/A 10/10/2018   Procedure: CARDIOVERSION;  Surgeon: Delford Maude BROCKS, MD;  Location: [facility] ENDOSCOPY;  Service: Cardiovascular;  Laterality: N/A;   CARDIOVERSION N/A 05/18/2020   Procedure: CARDIOVERSION;  Surgeon: Alveta Aleene PARAS, MD;  Location: [facility] ENDOSCOPY;  Service: Cardiovascular;  Laterality: N/A;   CARDIOVERSION N/A 12/09/2020   Procedure: CARDIOVERSION;  Surgeon: Pietro Redell RAMAN, MD;  Location: [facility] ENDOSCOPY;  Service: Cardiovascular;  Laterality: N/A;   CHOLECYSTECTOMY     PROSTATECTOMY     REPLACEMENT TOTAL KNEE BILATERAL     Patient Active Problem List   Diagnosis Date Noted   Bilateral leg pain 11/29/2023   Acute posthemorrhagic anemia 06/21/2022   Bilateral lower extremity edema 04/06/2022   Coronary artery disease involving native coronary artery of native heart without angina pectoris  04/05/2022   Gout 02/27/2022   Acute renal failure superimposed on stage 3b chronic kidney disease (HCC) 11/16/2021   Uremia 11/16/2021   Gout due to renal impairment 10/17/2021   Pancytopenia (HCC) 08/05/2021   SIRS (systemic inflammatory response syndrome) (HCC) 08/04/2021   Chronic kidney disease, stage 3b (HCC) 12/30/2020   Multiple falls    Spinal stenosis    S/P TKR (total knee replacement), bilateral    Arthralgia 11/20/2019   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   Lesion of liver 04/20/2019   Secondary hypercoagulable state 04/09/2019   Eosinophil count raised 02/17/2019   Acute tracheobronchitis 01/05/2019   Asthma    Vitamin D  deficiency 07/25/2018   Chronic anticoagulation 03/06/2018   Hyperlipidemia associated with type 2 diabetes mellitus (HCC) 01/28/2018   Basal cell carcinoma (BCC) of left side of nose 01/28/2018   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  Artificial knee joint present 05/20/2017   Left knee pain 05/07/2017   B12 deficiency 10/22/2016   Overweight (BMI 25.0-29.9) 10/22/2016   Primary osteoarthritis of right knee 03/29/2015   Osteoarthritis of right knee 03/29/2015   History of cholecystectomy 11/20/2014   Occult blood in stools 02/09/2014   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   Paroxysmal A-fib (HCC) 04/16/2013   Chronic heart failure with preserved ejection fraction (HFpEF) (HCC) 04/16/2013   Colon polyps 04/16/2013   NIDDM-2 with polyneuropathy and hyperglycemia 04/16/2013   Tinea cruris 04/16/2013   Diabetic neuropathy (HCC) 07/14/2012   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) 07/14/2012   Insomnia 03/04/2012   History of cardiovascular disorder 08/16/2011   Hypertensive heart disease with heart failure (HCC) 01/06/2010   Neoplasm of prostate, malignant (HCC) 01/06/2010   Hypercholesterolemia 01/06/2010   Essential hypertension 01/06/2010    PCP: Theophilus Andrews, Tully GRADE, MD   REFERRING PROVIDER: Darnella Dorn SAUNDERS,  MD   REFERRING DIAG: [phone] (ICD-10-CM) - Spinal stenosis, lumbar region with neurogenic claudication   Rationale for Evaluation and Treatment: Rehabilitation  THERAPY DIAG:  Unsteadiness on feet  Muscle weakness (generalized)  Other low back pain  Repeated falls  ONSET DATE: September 2024  SUBJECTIVE:                                                                                                                                                                                           SUBJECTIVE STATEMENT: Dry needling didn't help much. I have missed PT due to seeing the surgeon.  I was supposed to have surgery next week and they had to move it to 02/06/24.  I bent over 2 days ago and now I can't stand up straight.      Recent fall Friday morning. A lot of pain in his rear end since the fall in September. Since that time he sits a lot and has gotten weaker and weaker. Injections in back every 3 months. Would like to get back to walking short distances at [employer]. Feels taser shot in both legs intermittently. Hasn't slept in 3-4 weeks. Rolls over and feels it. Cannot stand and do dishes, cannot drive x 2 months. Has to be holding onto something for balance. Has chair lift at home.  Doing a bone density scan to see if surgery is viable. Trying chiropractor as well.   Accompanied by son  PERTINENT HISTORY:  Lumbar anterolisthesis, Peripheral neuropathy, TKR bilat, A-fib, CHF, DM   PAIN:  Are you having pain? Yes: NPRS scale: 5/10 Pain location: L  buttocks to post knees Pain description: taser Aggravating factors: unsure Relieving  factors: nothing  PRECAUTIONS: Fall  RED FLAGS: None   WEIGHT BEARING RESTRICTIONS: No  FALLS:  Has patient fallen in last 6 months? Yes. Number of falls 5  LIVING ENVIRONMENT:  Lives with: lives alone and family is near by and can help him easily  Lives in: House/apartment Stairs: two story townhouse with stair lift inside, has one  step going up to chair lift Has following equipment at home: Single point cane, Walker - 4 wheeled, and stair lift, raised toilet seats, shower stool   OCCUPATION: retired  PLOF: Independent with basic ADLs  PATIENT GOALS: Get back to independence  NEXT MD VISIT:   OBJECTIVE:  Note: Objective measures were completed at Evaluation unless otherwise noted.  DIAGNOSTIC FINDINGS:  MRI 1. Lumbar spondylosis and degenerative disc disease, causing markedly severe impingement at L4-5; prominent impingement at L2-3; and moderate to prominent impingement at L3-4. This impingement is roughly similar to the prior MRI exam from 08/04/2021. 2. Transitional lumbosacral vertebra is assumed to represent the S1 level. This conforms to the segmentation utilized on prior exams. Careful correlation with this numbering strategy prior to any procedural intervention would be recommended.  PATIENT SURVEYS:  THE PATIENT SPECIFIC FUNCTIONAL SCALE  Place score of 0-10 (0 = unable to perform activity and 10 = able to perform activity at the same level as before injury or problem)  Activity Date: 12/10/23    Standing to do dishes 0    2. Sleeping nightly  2    3 . Shopping 1x/wk 0    4.      Total Score 0.7      Total Score = Sum of activity scores/number of activities  Minimally Detectable Change: 3 points (for single activity); 2 points (for average score)  [facility] (nd). The Patient Specific Functional Scale . Retrieved from [email or web address]   COGNITION: Overall cognitive status: Within functional limits for tasks assessed     SENSATION: Neuropathy from knees down B  MUSCLE LENGTH: NT  POSTURE: rounded shoulders, forward head, and flexed trunk   PALPATION: Left gluteals piriformis  LUMBAR ROM: pain with neutral spine, flexion WFL  LOWER EXTREMITY ROM:   WNL for tasks assessed    Right eval Left eval  Hip  flexion    Hip extension    Hip abduction    Hip adduction    Hip internal rotation    Hip external rotation    Knee flexion    Knee extension    Ankle dorsiflexion    Ankle plantarflexion    Ankle inversion    Ankle eversion     (Blank rows = not tested)  LOWER EXTREMITY MMT:    MMT Right eval Left eval  Hip flexion 3 3+  Hip extension    Hip abduction 4 4-  Hip adduction 4+ 4+  Hip internal rotation    Hip external rotation    Knee flexion 4 4+  Knee extension 4+ 5  Ankle dorsiflexion 3- 3-  Ankle plantarflexion    Ankle inversion    Ankle eversion     (Blank rows = not tested)    FUNCTIONAL TESTS:  5 times sit to stand: 10.03 sec with B UE support and does not hold the stand (falls bwd) Timed up and go (TUG): 17.9 sec  GAIT: Distance walked: 40 Assistive device utilized: [employer] - 4 wheeled Level of assistance: Modified independence and SBA Comments: flexed trunk, one incidence of catching his toe  TREATMENT DATE:  01/07/24:  Seated marching: 2x10 Long arc quads: 5 hold x10 each  Nustep Level 5x 6 minutes-PT present to discuss progress Seated hamstring stretch- 3x20 seconds   Seated ER with band red 2x10 Weightshifting at counter -lateral and anterior/posterior x10 Standing march x10 with UE support Biceps curls: 5# 2x10 Seated row: red band 2x10    12/17/23:  Seated marching: 2x10 Seated hip abduction with green band 2x10 Sit to stand with pad in seat 2x5 Long arc quads: 5 hold x10 each  Nustep Level 5x 6 minutes-PT present to discuss progress Seated hamstring stretch- demo only  Trigger Point Dry Needling  Initial Treatment: Pt instructed on Dry Needling rational, procedures, and possible side effects. Pt instructed to expect mild to moderate muscle soreness later in the day and/or into the next day.  Pt instructed in  methods to reduce muscle soreness. Pt instructed to continue prescribed HEP. Patient was educated on signs and symptoms of infection and other risk factors and advised to seek medical attention should they occur.  Patient verbalized understanding of these instructions and education.   Patient Verbal Consent Given: Yes Education Handout Provided: Yes Muscles Treated: Lt gluteals  Electrical Stimulation Performed: No Treatment Response/Outcome: Utilized skilled palpation to identify bony landmarks and trigger points.  Able to illicit twitch response and muscle elongation.  Soft tissue mobilization to muscles needled following DN to further promote tissue elongation and decreased pain.     12/10/23  See pt ed and HEP   PATIENT EDUCATION:  Education details: PT eval findings, anticipated POC, initial HEP, gait safety with RW at all times, role of TPDN, and discussion of stenosis and posture and possible trial of aquatics.   Person educated: Patient and Child(ren) Education method: Explanation, Demonstration, Tactile cues, Verbal cues, and Handouts Education comprehension: verbalized understanding and returned demonstration  HOME EXERCISE PROGRAM: Access Code: [email or web address] URL: [email or web address] Date: 01/07/2024 Prepared by: Burnard  Exercises - Sit to Stand with Counter Support  - 3 x daily - 7 x weekly - 1 sets - 10 reps - Seated Hip Abduction with Resistance  - 1 x daily - 7 x weekly - 2 sets - 10 reps - Seated March  - 1 x daily - 7 x weekly - 2 sets - 10 reps - Seated Long Arc Quad  - 2 x daily - 7 x weekly - 1 sets - 10 reps - 5 hold - Seated Hamstring Stretch  - 3 x daily - 7 x weekly - 1 sets - 3 reps - 20 hold - Seated Bilateral Shoulder External Rotation with Resistance  - 2 x daily - 7 x weekly - 2 sets - 10 reps - Seated Bicep Curls Supinated with Dumbbells  - 2 x daily - 7 x weekly - 1-2 sets - 10 reps - Side to Side Weight Shift with Unilateral Counter Support   - 1 x daily - 7 x weekly - 3 sets - 10 reps - Standing March with Counter Support  - 1 x daily - 7 x weekly - 2 sets - 10 reps - Stride Stance Weight Shift  - 1 x daily - 7 x weekly - 1 sets - 10 reps - Seated Figure 4 Piriformis Stretch  - 3 x daily - 7 x weekly - 1 sets - 3 reps - 20 hold ASSESSMENT:  CLINICAL IMPRESSION: Lapse in treatment.  Pt is going to have lumbar surgery 02/06/24.  He would like to work on his overall mobility and strength  to prepare for surgery.  PT added to HEP with emphasis on increasing his time standing and postural alignment. PT monitored for pain and modified program accordingly.   He will benefit from skilled PT to address these deficits and those listed below.   OBJECTIVE IMPAIRMENTS: decreased activity tolerance, decreased balance, decreased endurance, difficulty walking, decreased ROM, decreased strength, decreased safety awareness, increased muscle spasms, impaired flexibility, postural dysfunction, and pain.   ACTIVITY LIMITATIONS: standing, sleeping, stairs, transfers, bed mobility, bathing, and locomotion level  PARTICIPATION LIMITATIONS: meal prep, cleaning, laundry, driving, shopping, and community activity  PERSONAL FACTORS: Age, Fitness, Time since onset of injury/illness/exacerbation, and 3+ comorbidities:  Peripheral neuropathy, TKR bilat, A-fib, CHF, DM are also affecting patient's functional outcome.   REHAB POTENTIAL: Good  CLINICAL DECISION MAKING: Evolving/moderate complexity  EVALUATION COMPLEXITY: Moderate   GOALS: Goals reviewed with patient? Yes  SHORT TERM GOALS: Target date: 01/07/2024   Patient will be independent with initial HEP.  Baseline:  Goal status: In progress   2.  Patient will report decreased left hip pain with sleeping by 50%.  Baseline:  Goal status: INITIAL  3.  Patient compliant with use of 4WW at home on main floor. Baseline:  Goal status: INITIAL   LONG TERM GOALS: Target date: 02/04/2024   Patient  will be independent with advanced/ongoing HEP to improve outcomes and carryover.  Baseline:  Goal status: INITIAL  2.  Patient will report 70% improvement in pain with ADLs to improve QOL.  Baseline:  Goal status: INITIAL  3.  Patient will able to step up on 8 inch step with one UE assist to reach stair lift easier. Baseline:  Goal status: INITIAL  4.  Patient will demonstrate improved functional strength by being able to complete 5XSTS without UE assist. Baseline:  Goal status: INITIAL  5.  Patient will score 3 or higher on PSFS demonstrating improved functional ability.  Baseline: 0.7 Goal status: INITIAL  6.  Patient able to stand to do his dishes .  Baseline:  Goal status: INITIAL     PLAN:  PT FREQUENCY: 2x/week  PT DURATION: 8 weeks  PLANNED INTERVENTIONS: 97164- PT Re-evaluation, 97110-Therapeutic exercises, 97530- Therapeutic activity, 97112- Neuromuscular re-education, 97535- Self Care, 02859- Manual therapy, [phone]- Gait training, [phone]- Aquatic Therapy, [phone]- Electrical stimulation (unattended), [id number]- Ultrasound, [id number]- Ionotophoresis 4mg /ml Dexamethasone , 79439 (1-2 muscles), 20561 (3+ muscles)- Dry Needling, Patient/Family education, Balance training, Stair training, Taping, Joint mobilization, Spinal mobilization, DME instructions, Cryotherapy, and Moist heat.  PLAN FOR NEXT SESSION: work on mobility and strength exercises to prepare for lumbar surgery in December    Burnard Joy, PT 01/07/24 11:58 AM

## 2024-01-07 NOTE — Telephone Encounter (Signed)
 Copied from CRM [id number]. Topic: General - Call Back - No Documentation >> Jan 07, 2024  9:35 AM Rea ORN wrote: Reason for CRM: pt calling clinic because the call dropped while he was speaking with CMA.   Please call back [phone]

## 2024-01-07 NOTE — Telephone Encounter (Signed)
 Copied from CRM [id number]. Topic: General - Other >> Jan 06, 2024 10:56 AM Aleatha BROCKS wrote: Reason for CRM: Spine Dr is waiting for Dr portia to release him so he can get the operation >> Jan 07, 2024  8:07 AM Thersia C wrote: Patient called in regarding a followup with surgery clearance , would like a callback once it has been completed

## 2024-01-07 NOTE — Telephone Encounter (Signed)
 2nd attempt Left message on machine for Dr Shelbie surgical scheduler to see if clearance is needed by Dr Theophilus.

## 2024-01-07 NOTE — Telephone Encounter (Signed)
Answered in phone note

## 2024-01-09 ENCOUNTER — Ambulatory Visit

## 2024-01-09 DIAGNOSIS — M5459 Other low back pain: Secondary | ICD-10-CM

## 2024-01-09 DIAGNOSIS — R296 Repeated falls: Secondary | ICD-10-CM

## 2024-01-09 DIAGNOSIS — M6281 Muscle weakness (generalized): Secondary | ICD-10-CM

## 2024-01-09 DIAGNOSIS — R2681 Unsteadiness on feet: Secondary | ICD-10-CM

## 2024-01-09 NOTE — Therapy (Signed)
 OUTPATIENT PHYSICAL THERAPY TREATMENT   Patient Name: Charles Lloyd. MRN: [id number] DOB:28-Oct-1940, 83 y.o., male Today's Date: 01/09/2024  END OF SESSION:  PT End of Session - 01/09/24 1230     Visit Number 4    Date for Recertification  02/04/24    Authorization Type [employer] MCR 16 visits 10/14 to 12/9    Authorization - Visit Number 4    Authorization - Number of Visits 16    Progress Note Due on Visit 10    PT Start Time 1148    PT Stop Time 1231    PT Time Calculation (min) 43 min    Activity Tolerance Patient tolerated treatment well    Behavior During Therapy WFL for tasks assessed/performed             Past Medical History:  Diagnosis Date   Acute tracheobronchitis 01/05/2019   B12 deficiency    Basal cell carcinoma (BCC) of left side of nose 01/28/2018   Cancer (HCC)    Cardiomyopathy (HCC) 03/10/2018   With chronic atrial fibrillation   CHF (congestive heart failure) (HCC) 04/16/2013   Functional class II, ejection fraction 35-40%  Formatting of this note might be different from the original. Functional class II, ejection fraction 35-40%   Chronic anticoagulation    Chronic diastolic (congestive) heart failure (HCC) 04/16/2013   Functional class II, ejection fraction 35-40%  Last Assessment & Plan:  Clinically stable Volume well controlled meds reviewed   Chronic diastolic CHF (congestive heart failure) (HCC) 04/16/2013   Functional class II, ejection fraction 35-40%  Last Assessment & Plan:  Clinically stable Volume well controlled meds reviewed   Colon polyps 04/16/2013   Coronary artery disease involving native coronary artery of native heart without angina pectoris 04/05/2022   Myoview  05/25/2019: EF 60, normal perfusion, low risk CCTA (AF protocol) 04/25/20: CAC score 4242 (99th percentile)   Diabetic neuropathy (HCC)    Eosinophil count raised 02/17/2019   Essential hypertension 01/06/2010   Last Assessment & Plan:  Well controlled Continue med  management   Gout    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  Steroid usage was discontinued several months ago.  Follow up with Dermatology as planned.   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   Last Assessment & Plan:  Id following ?ongoing abx managementy reported Request records   Infection of prosthetic right knee joint    Insomnia 03/04/2012   Grief with loss of wife 02/20/12  Last Assessment & Plan:  Improved sx  contineu xanax prn Se discussed   Lesion of liver 04/20/2019   -On cardiac CT 02/2019   Mild reactive airways disease    Neoplasm of prostate 04/16/2013   Neoplasm of prostate, 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 Transthoracic Echocardiogram-03/10/2013-[facility]: Normal left ventricular wall thickness and cavity size.  Global left  ventricular systolic function is moderately reduced.  The estimated ejection fraction is 35-40%.  The left atrium is moderately enlarged.  The right atrium is mildly enlarged.  No significant valvular    Overweight (BMI 25.0-29.9) 10/22/2016   Persistent atrial fibrillation (HCC) 04/16/2013   Last Assessment & Plan:  Rate controlled Continue med management eliquis  for stroke preventino  Formatting of this note might be different from the original.  Drug  HX Current Rx  Pre-ABL inefficacy Pre-ABL intolerant Post-ABL inefficacy Post-ABL intolerant max dose/24h M/Y end comments  sotalol                  dofetilide                  flecainide                   propafenone                  am   S/P TKR (total knee replacement), bilateral    Secondary hypercoagulable state 04/09/2019   Tinea cruris 04/16/2013   Type 2 diabetes mellitus with diabetic polyneuropathy, without long-term current use of insulin  (HCC) 04/16/2013   Last Assessment & Plan:  Labs today Pt reports well controlled on ambulatory monitoring   Vitamin D  deficiency 07/25/2018   Past Surgical History:  Procedure Laterality Date   ATRIAL FIBRILLATION ABLATION N/A 03/12/2019   Procedure: ATRIAL FIBRILLATION ABLATION;  Surgeon: Inocencio Soyla Lunger, MD;  Location: [facility];  Service: Cardiovascular;  Laterality: N/A;   ATRIAL FIBRILLATION ABLATION N/A 04/29/2020   Procedure: ATRIAL FIBRILLATION ABLATION;  Surgeon: Inocencio Soyla Lunger, MD;  Location: [facility];  Service: Cardiovascular;  Laterality: N/A;   CARDIAC ELECTROPHYSIOLOGY STUDY AND ABLATION     CARDIOVERSION     CARDIOVERSION N/A 10/10/2018   Procedure: CARDIOVERSION;  Surgeon: Delford Maude BROCKS, MD;  Location: [facility] ENDOSCOPY;  Service: Cardiovascular;  Laterality: N/A;   CARDIOVERSION N/A 05/18/2020   Procedure: CARDIOVERSION;  Surgeon: Alveta Aleene PARAS, MD;  Location: [facility] ENDOSCOPY;  Service: Cardiovascular;  Laterality: N/A;   CARDIOVERSION N/A 12/09/2020   Procedure: CARDIOVERSION;  Surgeon: Pietro Redell RAMAN, MD;  Location: [facility] ENDOSCOPY;  Service: Cardiovascular;  Laterality: N/A;   CHOLECYSTECTOMY     PROSTATECTOMY     REPLACEMENT TOTAL KNEE BILATERAL     Patient Active Problem List   Diagnosis Date Noted   Bilateral leg pain 11/29/2023   Acute posthemorrhagic anemia 06/21/2022   Bilateral lower extremity edema 04/06/2022   Coronary artery disease involving native coronary artery of native heart without angina pectoris  04/05/2022   Gout 02/27/2022   Acute renal failure superimposed on stage 3b chronic kidney disease (HCC) 11/16/2021   Uremia 11/16/2021   Gout due to renal impairment 10/17/2021   Pancytopenia (HCC) 08/05/2021   SIRS (systemic inflammatory response syndrome) (HCC) 08/04/2021   Chronic kidney disease, stage 3b (HCC) 12/30/2020   Multiple falls    Spinal stenosis    S/P TKR (total knee replacement), bilateral    Arthralgia 11/20/2019   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   Lesion of liver 04/20/2019   Secondary hypercoagulable state 04/09/2019   Eosinophil count raised 02/17/2019   Acute tracheobronchitis 01/05/2019   Asthma    Vitamin D  deficiency 07/25/2018   Chronic anticoagulation 03/06/2018   Hyperlipidemia associated with type 2 diabetes mellitus (HCC) 01/28/2018   Basal cell carcinoma (BCC) of left side of nose 01/28/2018   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  Artificial knee joint present 05/20/2017   Left knee pain 05/07/2017   B12 deficiency 10/22/2016   Overweight (BMI 25.0-29.9) 10/22/2016   Primary osteoarthritis of right knee 03/29/2015   Osteoarthritis of right knee 03/29/2015   History of cholecystectomy 11/20/2014   Occult blood in stools 02/09/2014   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   Paroxysmal A-fib (HCC) 04/16/2013   Chronic heart failure with preserved ejection fraction (HFpEF) (HCC) 04/16/2013   Colon polyps 04/16/2013   NIDDM-2 with polyneuropathy and hyperglycemia 04/16/2013   Tinea cruris 04/16/2013   Diabetic neuropathy (HCC) 07/14/2012   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) 07/14/2012   Insomnia 03/04/2012   History of cardiovascular disorder 08/16/2011   Hypertensive heart disease with heart failure (HCC) 01/06/2010   Neoplasm of prostate, malignant (HCC) 01/06/2010   Hypercholesterolemia 01/06/2010   Essential hypertension 01/06/2010    PCP: Theophilus Andrews, Tully GRADE, MD   REFERRING PROVIDER: Darnella Dorn SAUNDERS,  MD   REFERRING DIAG: [phone] (ICD-10-CM) - Spinal stenosis, lumbar region with neurogenic claudication   Rationale for Evaluation and Treatment: Rehabilitation  THERAPY DIAG:  Unsteadiness on feet  Muscle weakness (generalized)  Other low back pain  Repeated falls  ONSET DATE: September 2024  SUBJECTIVE:                                                                                                                                                                                           SUBJECTIVE STATEMENT: I felt like I had worked after last session but I'm ok now.  I have been lifting weights with my arms.     Recent fall Friday morning. A lot of pain in his rear end since the fall in September. Since that time he sits a lot and has gotten weaker and weaker. Injections in back every 3 months. Would like to get back to walking short distances at [employer]. Feels taser shot in both legs intermittently. Hasn't slept in 3-4 weeks. Rolls over and feels it. Cannot stand and do dishes, cannot drive x 2 months. Has to be holding onto something for balance. Has chair lift at home.  Doing a bone density scan to see if surgery is viable. Trying chiropractor as well.   Accompanied by son  PERTINENT HISTORY:  Lumbar anterolisthesis, Peripheral neuropathy, TKR bilat, A-fib, CHF, DM   PAIN: 01/09/24 Are you having pain? Yes: NPRS scale: 6/10 Pain location: L  buttocks to post knees Pain description: taser Aggravating factors: unsure, standing Relieving factors: nothing, sitting   PRECAUTIONS: Fall  RED FLAGS: None   WEIGHT BEARING RESTRICTIONS: No  FALLS:  Has patient fallen  in last 6 months? Yes. Number of falls 5  LIVING ENVIRONMENT:  Lives with: lives alone and family is near by and can help him easily  Lives in: House/apartment Stairs: two story townhouse with stair lift inside, has one step going up to chair lift Has following equipment at home: Single point cane,  Walker - 4 wheeled, and stair lift, raised toilet seats, shower stool   OCCUPATION: retired  PLOF: Independent with basic ADLs  PATIENT GOALS: Get back to independence  NEXT MD VISIT:   OBJECTIVE:  Note: Objective measures were completed at Evaluation unless otherwise noted.  DIAGNOSTIC FINDINGS:  MRI 1. Lumbar spondylosis and degenerative disc disease, causing markedly severe impingement at L4-5; prominent impingement at L2-3; and moderate to prominent impingement at L3-4. This impingement is roughly similar to the prior MRI exam from 08/04/2021. 2. Transitional lumbosacral vertebra is assumed to represent the S1 level. This conforms to the segmentation utilized on prior exams. Careful correlation with this numbering strategy prior to any procedural intervention would be recommended.  PATIENT SURVEYS:  THE PATIENT SPECIFIC FUNCTIONAL SCALE  Place score of 0-10 (0 = unable to perform activity and 10 = able to perform activity at the same level as before injury or problem)  Activity Date: 12/10/23    Standing to do dishes 0    2. Sleeping nightly  2    3 . Shopping 1x/wk 0    4.      Total Score 0.7      Total Score = Sum of activity scores/number of activities  Minimally Detectable Change: 3 points (for single activity); 2 points (for average score)  [facility] (nd). The Patient Specific Functional Scale . Retrieved from [email or web address]   COGNITION: Overall cognitive status: Within functional limits for tasks assessed     SENSATION: Neuropathy from knees down B  MUSCLE LENGTH: NT  POSTURE: rounded shoulders, forward head, and flexed trunk   PALPATION: Left gluteals piriformis  LUMBAR ROM: pain with neutral spine, flexion WFL  LOWER EXTREMITY ROM:   WNL for tasks assessed    Right eval Left eval  Hip flexion    Hip extension    Hip abduction    Hip adduction    Hip internal rotation     Hip external rotation    Knee flexion    Knee extension    Ankle dorsiflexion    Ankle plantarflexion    Ankle inversion    Ankle eversion     (Blank rows = not tested)  LOWER EXTREMITY MMT:    MMT Right eval Left eval  Hip flexion 3 3+  Hip extension    Hip abduction 4 4-  Hip adduction 4+ 4+  Hip internal rotation    Hip external rotation    Knee flexion 4 4+  Knee extension 4+ 5  Ankle dorsiflexion 3- 3-  Ankle plantarflexion    Ankle inversion    Ankle eversion     (Blank rows = not tested)    FUNCTIONAL TESTS:  5 times sit to stand: 10.03 sec with B UE support and does not hold the stand (falls bwd) Timed up and go (TUG): 17.9 sec  GAIT: Distance walked: 40 Assistive device utilized: [employer] - 4 wheeled Level of assistance: Modified independence and SBA Comments: flexed trunk, one incidence of catching his toe  TREATMENT DATE:  01/09/24:  Seated marching: 2x10 Long arc quads: 5 hold 2x10 each with 2# added  Nustep Level 5x 6 minutes-PT present to discuss progress Seated hamstring stretch- 3x20 seconds   Seated row with green band 2x10 Weightshifting at counter -lateral and anterior/posterior x10 Standing hip abduction and extension at barre 2x5  Standing march 2 x10 with UE support Shoulder flexion 2# added 2x10 bil each    01/07/24:  Seated marching: 2x10 Long arc quads: 5 hold x10 each  Nustep Level 5x 6 minutes-PT present to discuss progress Seated hamstring stretch- 3x20 seconds   Seated ER with band red 2x10 Weightshifting at counter -lateral and anterior/posterior x10 Standing march x10 with UE support Biceps curls: 5# 2x10 Seated row: red band 2x10    12/17/23:  Seated marching: 2x10 Seated hip abduction with green band 2x10 Sit to stand with pad in seat 2x5 Long arc quads: 5 hold x10 each  Nustep Level  5x 6 minutes-PT present to discuss progress Seated hamstring stretch- demo only  Trigger Point Dry Needling  Initial Treatment: Pt instructed on Dry Needling rational, procedures, and possible side effects. Pt instructed to expect mild to moderate muscle soreness later in the day and/or into the next day.  Pt instructed in methods to reduce muscle soreness. Pt instructed to continue prescribed HEP. Patient was educated on signs and symptoms of infection and other risk factors and advised to seek medical attention should they occur.  Patient verbalized understanding of these instructions and education.   Patient Verbal Consent Given: Yes Education Handout Provided: Yes Muscles Treated: Lt gluteals  Electrical Stimulation Performed: No Treatment Response/Outcome: Utilized skilled palpation to identify bony landmarks and trigger points.  Able to illicit twitch response and muscle elongation.  Soft tissue mobilization to muscles needled following DN to further promote tissue elongation and decreased pain.     12/10/23  See pt ed and HEP   PATIENT EDUCATION:  Education details: PT eval findings, anticipated POC, initial HEP, gait safety with RW at all times, role of TPDN, and discussion of stenosis and posture and possible trial of aquatics.   Person educated: Patient and Child(ren) Education method: Explanation, Demonstration, Tactile cues, Verbal cues, and Handouts Education comprehension: verbalized understanding and returned demonstration  HOME EXERCISE PROGRAM: Access Code: [email or web address] URL: [email or web address] Date: 01/07/2024 Prepared by: Burnard  Exercises - Sit to Stand with Counter Support  - 3 x daily - 7 x weekly - 1 sets - 10 reps - Seated Hip Abduction with Resistance  - 1 x daily - 7 x weekly - 2 sets - 10 reps - Seated March  - 1 x daily - 7 x weekly - 2 sets - 10 reps - Seated Long Arc Quad  - 2 x daily - 7 x weekly - 1 sets - 10 reps - 5 hold - Seated  Hamstring Stretch  - 3 x daily - 7 x weekly - 1 sets - 3 reps - 20 hold - Seated Bilateral Shoulder External Rotation with Resistance  - 2 x daily - 7 x weekly - 2 sets - 10 reps - Seated Bicep Curls Supinated with Dumbbells  - 2 x daily - 7 x weekly - 1-2 sets - 10 reps - Side to Side Weight Shift with Unilateral Counter Support  - 1 x daily - 7 x weekly - 3 sets - 10 reps - Standing March with Counter Support  - 1 x daily - 7 x weekly - 2 sets - 10 reps -  Stride Stance Weight Shift  - 1 x daily - 7 x weekly - 1 sets - 10 reps - Seated Figure 4 Piriformis Stretch  - 3 x daily - 7 x weekly - 1 sets - 3 reps - 20 hold ASSESSMENT:  CLINICAL IMPRESSION: Pt continues to report LBP and leg  He would like to work on his overall mobility and strength to prepare for surgery.  Pt did well with addition of weights and did more standing exercises.  She required rest breaks due to limited standing tolerance.  PT monitored for pain and modified program accordingly.   He will benefit from skilled PT to address these deficits and those listed below.   OBJECTIVE IMPAIRMENTS: decreased activity tolerance, decreased balance, decreased endurance, difficulty walking, decreased ROM, decreased strength, decreased safety awareness, increased muscle spasms, impaired flexibility, postural dysfunction, and pain.   ACTIVITY LIMITATIONS: standing, sleeping, stairs, transfers, bed mobility, bathing, and locomotion level  PARTICIPATION LIMITATIONS: meal prep, cleaning, laundry, driving, shopping, and community activity  PERSONAL FACTORS: Age, Fitness, Time since onset of injury/illness/exacerbation, and 3+ comorbidities:  Peripheral neuropathy, TKR bilat, A-fib, CHF, DM are also affecting patient's functional outcome.   REHAB POTENTIAL: Good  CLINICAL DECISION MAKING: Evolving/moderate complexity  EVALUATION COMPLEXITY: Moderate   GOALS: Goals reviewed with patient? Yes  SHORT TERM GOALS: Target date: 01/07/2024    Patient will be independent with initial HEP.  Baseline: 01/09/24 Goal status: MET   2.  Patient will report decreased left hip pain with sleeping by 50%.  Baseline:  Goal status: INITIAL  3.  Patient compliant with use of 4WW at home on main floor. Baseline: 01/09/24 Goal status: MET   LONG TERM GOALS: Target date: 02/04/2024   Patient will be independent with advanced/ongoing HEP to improve outcomes and carryover.  Baseline:  Goal status: INITIAL  2.  Patient will report 70% improvement in pain with ADLs to improve QOL.  Baseline:  Goal status: INITIAL  3.  Patient will able to step up on 8 inch step with one UE assist to reach stair lift easier. Baseline:  Goal status: INITIAL  4.  Patient will demonstrate improved functional strength by being able to complete 5XSTS without UE assist. Baseline:  Goal status: INITIAL  5.  Patient will score 3 or higher on PSFS demonstrating improved functional ability.  Baseline: 0.7 Goal status: INITIAL  6.  Patient able to stand to do his dishes .  Baseline:  Goal status: INITIAL     PLAN:  PT FREQUENCY: 2x/week  PT DURATION: 8 weeks  PLANNED INTERVENTIONS: 97164- PT Re-evaluation, 97110-Therapeutic exercises, 97530- Therapeutic activity, 97112- Neuromuscular re-education, 97535- Self Care, 02859- Manual therapy, [phone]- Gait training, [phone]- Aquatic Therapy, [phone]- Electrical stimulation (unattended), 97035- Ultrasound, [id number]- Ionotophoresis 4mg /ml Dexamethasone , 79439 (1-2 muscles), 20561 (3+ muscles)- Dry Needling, Patient/Family education, Balance training, Stair training, Taping, Joint mobilization, Spinal mobilization, DME instructions, Cryotherapy, and Moist heat.  PLAN FOR NEXT SESSION: work on mobility and strength exercises to prepare for lumbar surgery in December    Burnard Joy, PT 01/09/24 12:33 PM

## 2024-01-10 ENCOUNTER — Other Ambulatory Visit: Payer: Self-pay | Admitting: Internal Medicine

## 2024-01-10 ENCOUNTER — Ambulatory Visit: Attending: Cardiovascular Disease

## 2024-01-10 DIAGNOSIS — M1A0411 Idiopathic chronic gout, right hand, with tophus (tophi): Secondary | ICD-10-CM

## 2024-01-10 DIAGNOSIS — Z01810 Encounter for preprocedural cardiovascular examination: Secondary | ICD-10-CM

## 2024-01-10 NOTE — Progress Notes (Signed)
 Virtual Visit via Telephone Note   Because of [name]. co-morbid illnesses, he is at least at moderate risk for complications without adequate follow up.  This format is felt to be most appropriate for this patient at this time.  Due to technical limitations with video connection (technology), today's appointment will be conducted as an audio only telehealth visit, and Charles Lloyd. verbally agreed to proceed in this manner.   All issues noted in this document were discussed and addressed.  No physical exam could be performed with this format.  Evaluation Performed:  Preoperative cardiovascular risk assessment _____________   Date:  01/10/2024   Patient ID:  Charles GORMAN Juanito Mickey., DOB Mar 20, 1940, MRN [id number] Patient Location:  Home Provider location:   Office  Primary Care Provider:  Theophilus Andrews, Tully GRADE, MD Primary Cardiologist:  Lamar Fitch, MD  Chief Complaint / Patient Profile   83 y.o. y/o male with a h/o hypertension, paroxysmal atrial fibrillation, HFpEF, coronary artery disease who is pending Lumbar Fusion  and presents today for telephonic preoperative cardiovascular risk assessment.  History of Present Illness    Charles Outten. is a 83 y.o. male who presents via audio/video conferencing for a telehealth visit today.  Pt was last seen in cardiology clinic on 06/04/2023 by Dr. Fitch.  At that time Charles Rippeon. was doing well .  The patient is now pending procedure as outlined above. Since his last visit, he continues to be stable from a cardiac standpoint.  Today he denies chest pain, shortness of breath, lower extremity edema, fatigue, palpitations, melena, hematuria, hemoptysis, diaphoresis, weakness, presyncope, syncope, orthopnea, and PND.   Past Medical History    Past Medical History:  Diagnosis Date   Acute tracheobronchitis 01/05/2019   B12 deficiency    Basal cell carcinoma (BCC) of left side of nose 01/28/2018    Cancer (HCC)    Cardiomyopathy (HCC) 03/10/2018   With chronic atrial fibrillation   CHF (congestive heart failure) (HCC) 04/16/2013   Functional class II, ejection fraction 35-40%  Formatting of this note might be different from the original. Functional class II, ejection fraction 35-40%   Chronic anticoagulation    Chronic diastolic (congestive) heart failure (HCC) 04/16/2013   Functional class II, ejection fraction 35-40%  Last Assessment & Plan:  Clinically stable Volume well controlled meds reviewed   Chronic diastolic CHF (congestive heart failure) (HCC) 04/16/2013   Functional class II, ejection fraction 35-40%  Last Assessment & Plan:  Clinically stable Volume well controlled meds reviewed   Colon polyps 04/16/2013   Coronary artery disease involving native coronary artery of native heart without angina pectoris 04/05/2022   Myoview  05/25/2019: EF 60, normal perfusion, low risk CCTA (AF protocol) 04/25/20: CAC score 4242 (99th percentile)   Diabetic neuropathy (HCC)    Eosinophil count raised 02/17/2019   Essential hypertension 01/06/2010   Last Assessment & Plan:  Well controlled Continue med management   Gout    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  Steroid usage was discontinued several months ago.  Follow up with Dermatology as planned.   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   Last Assessment & Plan:  Id following ?ongoing abx managementy reported Request records   Infection of prosthetic right knee joint    Insomnia 03/04/2012   Grief with loss of wife 02/20/12  Last Assessment & Plan:  Improved sx   contineu xanax prn Se discussed   Lesion of liver 04/20/2019   -On cardiac CT 02/2019   Mild reactive airways disease    Neoplasm of prostate 04/16/2013   Neoplasm of prostate, 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 Transthoracic Echocardiogram-03/10/2013-[facility]: Normal left ventricular wall thickness and cavity size.  Global left ventricular systolic function is moderately reduced.  The estimated ejection fraction is 35-40%.  The left atrium is moderately enlarged.  The right atrium is mildly enlarged.  No significant valvular    Overweight (BMI 25.0-29.9) 10/22/2016   Persistent atrial fibrillation (HCC) 04/16/2013   Last Assessment & Plan:  Rate controlled Continue med management eliquis  for stroke preventino  Formatting of this note might be different from the original.  Drug  HX Current Rx Pre-ABL inefficacy Pre-ABL intolerant Post-ABL inefficacy Post-ABL intolerant max dose/24h M/Y end comments  sotalol                  dofetilide                  flecainide                   propafenone                  am   S/P TKR (total knee replacement), bilateral    Secondary hypercoagulable state 04/09/2019   Tinea cruris 04/16/2013   Type 2 diabetes mellitus with diabetic polyneuropathy, without long-term current use of insulin  (HCC) 04/16/2013   Last Assessment & Plan:  Labs today Pt reports well controlled on ambulatory monitoring   Vitamin D  deficiency 07/25/2018   Past Surgical History:  Procedure Laterality Date   ATRIAL FIBRILLATION ABLATION N/A 03/12/2019   Procedure: ATRIAL FIBRILLATION ABLATION;  Surgeon: Inocencio Soyla Lunger, MD;  Location: [facility];  Service:  Cardiovascular;  Laterality: N/A;   ATRIAL FIBRILLATION ABLATION N/A 04/29/2020   Procedure: ATRIAL FIBRILLATION ABLATION;  Surgeon: Inocencio Soyla Lunger, MD;  Location: [facility];  Service: Cardiovascular;  Laterality: N/A;   CARDIAC ELECTROPHYSIOLOGY STUDY AND ABLATION     CARDIOVERSION     CARDIOVERSION N/A 10/10/2018   Procedure: CARDIOVERSION;  Surgeon: Delford Maude BROCKS, MD;  Location: [facility] ENDOSCOPY;  Service: Cardiovascular;  Laterality: N/A;   CARDIOVERSION N/A 05/18/2020   Procedure: CARDIOVERSION;  Surgeon: Alveta Aleene PARAS, MD;  Location: [facility] ENDOSCOPY;  Service: Cardiovascular;  Laterality: N/A;   CARDIOVERSION N/A 12/09/2020   Procedure: CARDIOVERSION;  Surgeon: Pietro Redell RAMAN, MD;  Location: [facility] ENDOSCOPY;  Service: Cardiovascular;  Laterality: N/A;   CHOLECYSTECTOMY     PROSTATECTOMY     REPLACEMENT TOTAL KNEE BILATERAL      Allergies  Allergies  Allergen Reactions   Amlodipine  Other (See Comments)    Severe edema, chest pain, nausea     Home Medications    Prior to Admission medications   Medication Sig Start  Date End Date Taking? Authorizing Provider  ACCU-CHEK AVIVA PLUS test strip 1 EACH BY OTHER ROUTE DAILY. DX E11.9 Patient taking differently: 2 each by Other route daily. Dx E11.9 08/28/21   Theophilus Andrews, Tully GRADE, MD  albuterol  (VENTOLIN  HFA) [phone] Base) MCG/ACT inhaler Inhale 1-2 puffs into the lungs every 6 (six) hours as needed for wheezing or shortness of breath. 10/05/20   Parrett, Madelin RAMAN, NP  allopurinol  (ZYLOPRIM ) 300 MG tablet TAKE 1 TABLET BY MOUTH EVERY DAY 10/14/23   Theophilus Andrews, Tully GRADE, MD  apixaban  (ELIQUIS ) 5 MG TABS tablet Take 1 tablet (5 mg total) by mouth 2 (two) times daily. 08/02/23   Theophilus Andrews, Tully GRADE, MD  atorvastatin  (LIPITOR) 20 MG tablet TAKE 1 TABLET DAILY (PLEASE SCHEDULE A PHYSICAL FOR MORE REFILLS) 12/09/23   Panosh, Apolinar POUR, MD  carvedilol  (COREG ) 3.125 MG tablet TAKE 1 TABLET BY MOUTH TWICE A DAY 12/31/23    Theophilus Andrews, Tully GRADE, MD  cephALEXin  (KEFLEX ) 500 MG capsule Take 1 capsule (500 mg total) by mouth 3 (three) times daily. 08/23/23   Hoy Nidia FALCON, PA-C  colchicine  0.6 MG tablet Take 1 tablet (0.6 mg total) by mouth 2 (two) times daily. 01/08/22   Theophilus Andrews, Tully GRADE, MD  Continuous Blood Gluc Receiver (FREESTYLE LIBRE 2 READER) DEVI 1 each by Does not apply route daily. 08/28/21   Theophilus Andrews, Tully GRADE, MD  Continuous Blood Gluc Sensor (FREESTYLE LIBRE 2 SENSOR) MISC 1 each by Does not apply route daily. 08/28/21   Theophilus Andrews, Tully GRADE, MD  cyclobenzaprine  (FLEXERIL ) 10 MG tablet Take 0.5 tablets (5 mg total) by mouth 2 (two) times daily as needed for muscle spasms. 09/22/23   Simon Lavonia SAILOR, MD  diclofenac  Sodium (VOLTAREN ) 1 % GEL Apply 4 g topically 4 (four) times daily. 10/30/23   Emil Share, DO  doxycycline  (VIBRA -TABS) 100 MG tablet TAKE 1 TABLET BY MOUTH TWICE A DAY 09/23/23   Vernetta Lonni GRADE, MD  flecainide  (TAMBOCOR ) 100 MG tablet TAKE 1 TABLET BY MOUTH TWICE A DAY 10/24/23   Camnitz, Soyla Lunger, MD  fluticasone  (FLONASE ) 50 MCG/ACT nasal spray Place 2 sprays into both nostrils daily. 01/07/19   Rosario Leatrice FERNS, MD  fluticasone  furoate-vilanterol (BREO ELLIPTA ) 100-25 MCG/INH AEPB Inhale 1 puff into the lungs daily. 10/05/20   Parrett, Madelin RAMAN, NP  gabapentin  (NEURONTIN ) 300 MG capsule Take one tablet in the morning, one tablet in the afternoon, and 2 tablets at bedtime 12/12/23   Theophilus Andrews, Tully GRADE, MD  hydrALAZINE  (APRESOLINE ) 25 MG tablet TAKE 1 TABLET (25 MG TOTAL) BY MOUTH 4 (FOUR) TIMES DAILY. 10/22/23   Krasowski, Robert J, MD  HYDROcodone -acetaminophen  (NORCO/VICODIN) 5-325 MG tablet Take 1-2 tablets by mouth every 6 (six) hours as needed for moderate pain (pain score 4-6). 11/26/23   Theophilus Andrews, Tully GRADE, MD  insulin  glargine (LANTUS  SOLOSTAR) 100 UNIT/ML Solostar Pen Inject 8 Units into the skin daily. 09/29/21   Burchette, Wolm ORN, MD   irbesartan  (AVAPRO ) 150 MG tablet Take 1 tablet (150 mg total) by mouth daily. 12/10/22   Camnitz, Soyla Lunger, MD  lidocaine  (LIDODERM ) 5 % Place 1 patch onto the skin daily. Remove & Discard patch within 12 hours or as directed by MD 09/22/23   Simon Lavonia SAILOR, MD  meloxicam  (MOBIC ) 7.5 MG tablet TAKE 1 TABLET BY MOUTH EVERY DAY 08/13/23   Theophilus Andrews, Tully GRADE, MD  methocarbamol  (ROBAXIN ) 500 MG tablet Take 1 tablet (500  mg total) by mouth 2 (two) times daily. 10/30/23   Emil Share, DO  methylPREDNISolone  (MEDROL  DOSEPAK) 4 MG TBPK tablet Take as directed on package. 10/30/23   Emil Share, DO  montelukast  (SINGULAIR ) 10 MG tablet Take 10 mg by mouth at bedtime.     [provider]  potassium chloride  SA (KLOR-CON  M20) 20 MEQ tablet Take 1 tablet (20 mEq total) by mouth daily. 09/18/23   Theophilus Andrews, Tully GRADE, MD  RESTASIS  0.05 % ophthalmic emulsion Place 1 drop into both eyes 2 (two) times daily as needed (dry eyes). 12/09/19   [provider]  torsemide  (DEMADEX ) 20 MG tablet Take 1 tablet (20 mg total) by mouth 2 (two) times daily. 05/03/23   Bernie Lamar PARAS, MD    Physical Exam    Vital Signs:  Charles GORMAN Juanito Mickey. does not have vital signs available for review today.  Given telephonic nature of communication, physical exam is limited. AAOx3. NAD. Normal affect.  Speech and respirations are unlabored.  Accessory Clinical Findings    None  Assessment & Plan    1.  Preoperative Cardiovascular Risk Assessment:Lumbar Fusion   Date of Surgery:  Clearance TBD                                  Surgeon:  Dr. Dorn Glade Surgeon's Group or Practice Name:  [facility] Phone number:  [phone] Fax number:  [phone]    Primary Cardiologist: Lamar Bernie, MD  Chart reviewed as part of pre-operative protocol coverage. Given past medical history and time since last visit, based on ACC/AHA guidelines, Charles Dollins.  would be at acceptable risk for the planned procedure without further cardiovascular testing.   His RCRI is high risk, greater then 11% risk of major cardiac event.  He is able to complete greater than 4 METS of physical activity.  Patient was advised that if he develops new symptoms prior to surgery to contact our office to arrange a follow-up appointment.  He verbalized understanding.  Per office protocol, patient can hold Eliquis  for 3 days prior to procedure.   I will route this recommendation to the requesting party via Epic fax function and remove from pre-op pool.   Duplicate visit.  Patient talked to Charles Hoover, NP on 01/03/2024.  Note was routed to surgeon.    Time:   Today, I have spent 4 minutes with the patient with telehealth technology discussing medical history, symptoms, and management plan.  I spent 10 minutes reviewing patient's past cardiac history and cardiac medications.    Josefa CHRISTELLA Beauvais, NP  01/10/2024, 7:02 AM

## 2024-01-13 ENCOUNTER — Ambulatory Visit: Admitting: Physical Therapy

## 2024-01-15 ENCOUNTER — Ambulatory Visit: Admitting: Physical Therapy

## 2024-01-20 ENCOUNTER — Ambulatory Visit

## 2024-01-20 DIAGNOSIS — M5459 Other low back pain: Secondary | ICD-10-CM

## 2024-01-20 DIAGNOSIS — R296 Repeated falls: Secondary | ICD-10-CM

## 2024-01-20 DIAGNOSIS — R2681 Unsteadiness on feet: Secondary | ICD-10-CM

## 2024-01-20 DIAGNOSIS — M6281 Muscle weakness (generalized): Secondary | ICD-10-CM

## 2024-01-20 NOTE — Therapy (Signed)
 OUTPATIENT PHYSICAL THERAPY TREATMENT   Patient Name: Charles Lloyd. MRN: [id number] DOB:1940-05-17, 83 y.o., male Today's Date: 01/20/2024  END OF SESSION:  PT End of Session - 01/20/24 1146     Visit Number 5    Date for Recertification  02/04/24    Authorization Type U[employer] MCR 16 visits 10/14 to 12/9    Authorization - Visit Number 5    Authorization - Number of Visits 16    PT Start Time 1100    PT Stop Time 1143    PT Time Calculation (min) 43 min    Activity Tolerance Patient tolerated treatment well    Behavior During Therapy WFL for tasks assessed/performed              Past Medical History:  Diagnosis Date   Acute tracheobronchitis 01/05/2019   B12 deficiency    Basal cell carcinoma (BCC) of left side of nose 01/28/2018   Cancer (HCC)    Cardiomyopathy (HCC) 03/10/2018   With chronic atrial fibrillation   CHF (congestive heart failure) (HCC) 04/16/2013   Functional class II, ejection fraction 35-40%  Formatting of this note might be different from the original. Functional class II, ejection fraction 35-40%   Chronic anticoagulation    Chronic diastolic (congestive) heart failure (HCC) 04/16/2013   Functional class II, ejection fraction 35-40%  Last Assessment & Plan:  Clinically stable Volume well controlled meds reviewed   Chronic diastolic CHF (congestive heart failure) (HCC) 04/16/2013   Functional class II, ejection fraction 35-40%  Last Assessment & Plan:  Clinically stable Volume well controlled meds reviewed   Colon polyps 04/16/2013   Coronary artery disease involving native coronary artery of native heart without angina pectoris 04/05/2022   Myoview  05/25/2019: EF 60, normal perfusion, low risk CCTA (AF protocol) 04/25/20: CAC score 4242 (99th percentile)   Diabetic neuropathy (HCC)    Eosinophil count raised 02/17/2019   Essential hypertension 01/06/2010   Last Assessment & Plan:  Well controlled Continue med management   Gout    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  Steroid usage was discontinued several months ago.  Follow up with Dermatology as planned.   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   Last Assessment & Plan:  Id following ?ongoing abx managementy reported Request records   Infection of prosthetic right knee joint    Insomnia 03/04/2012   Grief with loss of wife 02/20/12  Last Assessment & Plan:  Improved sx  contineu xanax prn Se discussed   Lesion of liver 04/20/2019   -On cardiac CT 02/2019   Mild reactive airways disease    Neoplasm of prostate 04/16/2013   Neoplasm of prostate, 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 Transthoracic Echocardiogram-03/10/2013-[facility]: Normal left ventricular wall thickness and cavity size.  Global left ventricular systolic function is moderately reduced.  The  estimated ejection fraction is 35-40%.  The left atrium is moderately enlarged.  The right atrium is mildly enlarged.  No significant valvular    Overweight (BMI 25.0-29.9) 10/22/2016   Persistent atrial fibrillation (HCC) 04/16/2013   Last Assessment & Plan:  Rate controlled Continue med management eliquis  for stroke preventino  Formatting of this note might be different from the original.  Drug  HX Current Rx Pre-ABL inefficacy Pre-ABL  intolerant Post-ABL inefficacy Post-ABL intolerant max dose/24h M/Y end comments  sotalol                  dofetilide                  flecainide                   propafenone                  am   S/P TKR (total knee replacement), bilateral    Secondary hypercoagulable state 04/09/2019   Tinea cruris 04/16/2013   Type 2 diabetes mellitus with diabetic polyneuropathy, without long-term current use of insulin  (HCC) 04/16/2013   Last Assessment & Plan:  Labs today Pt reports well controlled on ambulatory monitoring   Vitamin D  deficiency 07/25/2018   Past Surgical History:  Procedure Laterality Date   ATRIAL FIBRILLATION ABLATION N/A 03/12/2019   Procedure: ATRIAL FIBRILLATION ABLATION;  Surgeon: Inocencio Soyla Lunger, MD;  Location: [facility];  Service: Cardiovascular;  Laterality: N/A;   ATRIAL FIBRILLATION ABLATION N/A 04/29/2020   Procedure: ATRIAL FIBRILLATION ABLATION;  Surgeon: Inocencio Soyla Lunger, MD;  Location: [facility];  Service: Cardiovascular;  Laterality: N/A;   CARDIAC ELECTROPHYSIOLOGY STUDY AND ABLATION     CARDIOVERSION     CARDIOVERSION N/A 10/10/2018   Procedure: CARDIOVERSION;  Surgeon: Delford Maude BROCKS, MD;  Location: [facility] ENDOSCOPY;  Service: Cardiovascular;  Laterality: N/A;   CARDIOVERSION N/A 05/18/2020   Procedure: CARDIOVERSION;  Surgeon: Alveta Aleene PARAS, MD;  Location: [facility] ENDOSCOPY;  Service: Cardiovascular;  Laterality: N/A;   CARDIOVERSION N/A 12/09/2020   Procedure: CARDIOVERSION;  Surgeon: Pietro Redell RAMAN, MD;  Location: [facility] ENDOSCOPY;  Service: Cardiovascular;  Laterality: N/A;   CHOLECYSTECTOMY     PROSTATECTOMY     REPLACEMENT TOTAL KNEE BILATERAL     Patient Active Problem List   Diagnosis Date Noted   Bilateral leg pain 11/29/2023   Acute posthemorrhagic anemia 06/21/2022   Bilateral lower extremity edema 04/06/2022   Coronary artery disease involving native coronary artery of native heart without angina pectoris 04/05/2022   Gout  02/27/2022   Acute renal failure superimposed on stage 3b chronic kidney disease (HCC) 11/16/2021   Uremia 11/16/2021   Gout due to renal impairment 10/17/2021   Pancytopenia (HCC) 08/05/2021   SIRS (systemic inflammatory response syndrome) (HCC) 08/04/2021   Chronic kidney disease, stage 3b (HCC) 12/30/2020   Multiple falls    Spinal stenosis    S/P TKR (total knee replacement), bilateral    Arthralgia 11/20/2019   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   Lesion of liver 04/20/2019   Secondary hypercoagulable state 04/09/2019   Eosinophil count raised 02/17/2019   Acute tracheobronchitis 01/05/2019   Asthma    Vitamin D  deficiency 07/25/2018   Chronic anticoagulation 03/06/2018   Hyperlipidemia associated with type 2 diabetes mellitus (HCC) 01/28/2018   Basal cell carcinoma (BCC) of left side of nose 01/28/2018   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   Artificial knee joint present 05/20/2017  Left knee pain 05/07/2017   B12 deficiency 10/22/2016   Overweight (BMI 25.0-29.9) 10/22/2016   Primary osteoarthritis of right knee 03/29/2015   Osteoarthritis of right knee 03/29/2015   History of cholecystectomy 11/20/2014   Occult blood in stools 02/09/2014   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   Paroxysmal A-fib (HCC) 04/16/2013   Chronic heart failure with preserved ejection fraction (HFpEF) (HCC) 04/16/2013   Colon polyps 04/16/2013   NIDDM-2 with polyneuropathy and hyperglycemia 04/16/2013   Tinea cruris 04/16/2013   Diabetic neuropathy (HCC) 07/14/2012   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) 07/14/2012   Insomnia 03/04/2012   History of cardiovascular disorder 08/16/2011   Hypertensive heart disease with heart failure (HCC) 01/06/2010   Neoplasm of prostate, malignant (HCC) 01/06/2010   Hypercholesterolemia 01/06/2010   Essential hypertension 01/06/2010    PCP: Theophilus Andrews, Tully GRADE, MD   REFERRING PROVIDER: Darnella Dorn SAUNDERS, MD   REFERRING  DIAG: [phone] (ICD-10-CM) - Spinal stenosis, lumbar region with neurogenic claudication   Rationale for Evaluation and Treatment: Rehabilitation  THERAPY DIAG:  Unsteadiness on feet  Muscle weakness (generalized)  Other low back pain  Repeated falls  ONSET DATE: September 2024  SUBJECTIVE:                                                                                                                                                                                           SUBJECTIVE STATEMENT: I had a fall when doing housework.  My leg just went numb.  I am doing as much as I can at home to stay strong.      Recent fall Friday morning. A lot of pain in his rear end since the fall in September. Since that time he sits a lot and has gotten weaker and weaker. Injections in back every 3 months. Would like to get back to walking short distances at [employer]. Feels taser shot in both legs intermittently. Hasn't slept in 3-4 weeks. Rolls over and feels it. Cannot stand and do dishes, cannot drive x 2 months. Has to be holding onto something for balance. Has chair lift at home.  Doing a bone density scan to see if surgery is viable. Trying chiropractor as well.   Accompanied by son  PERTINENT HISTORY:  Lumbar anterolisthesis, Peripheral neuropathy, TKR bilat, A-fib, CHF, DM   PAIN: 01/20/24 Are you having pain? Yes: NPRS scale: 2-8/10 Pain location: L  buttocks to post knees Pain description: taser Aggravating factors: unsure, standing Relieving factors: nothing, sitting   PRECAUTIONS: Fall  RED FLAGS: None   WEIGHT BEARING RESTRICTIONS: No  FALLS:  Has patient fallen in  last 6 months? Yes. Number of falls 5  LIVING ENVIRONMENT:  Lives with: lives alone and family is near by and can help him easily  Lives in: House/apartment Stairs: two story townhouse with stair lift inside, has one step going up to chair lift Has following equipment at home: Single point cane, Walker -  4 wheeled, and stair lift, raised toilet seats, shower stool   OCCUPATION: retired  PLOF: Independent with basic ADLs  PATIENT GOALS: Get back to independence  NEXT MD VISIT:   OBJECTIVE:  Note: Objective measures were completed at Evaluation unless otherwise noted.  DIAGNOSTIC FINDINGS:  MRI 1. Lumbar spondylosis and degenerative disc disease, causing markedly severe impingement at L4-5; prominent impingement at L2-3; and moderate to prominent impingement at L3-4. This impingement is roughly similar to the prior MRI exam from 08/04/2021. 2. Transitional lumbosacral vertebra is assumed to represent the S1 level. This conforms to the segmentation utilized on prior exams. Careful correlation with this numbering strategy prior to any procedural intervention would be recommended.  PATIENT SURVEYS:  THE PATIENT SPECIFIC FUNCTIONAL SCALE  Place score of 0-10 (0 = unable to perform activity and 10 = able to perform activity at the same level as before injury or problem)  Activity Date: 12/10/23    Standing to do dishes 0    2. Sleeping nightly  2    3 . Shopping 1x/wk 0    4.      Total Score 0.7      Total Score = Sum of activity scores/number of activities  Minimally Detectable Change: 3 points (for single activity); 2 points (for average score)  [facility] (nd). The Patient Specific Functional Scale . Retrieved from [email or web address]   COGNITION: Overall cognitive status: Within functional limits for tasks assessed     SENSATION: Neuropathy from knees down B  MUSCLE LENGTH: NT  POSTURE: rounded shoulders, forward head, and flexed trunk   PALPATION: Left gluteals piriformis  LUMBAR ROM: pain with neutral spine, flexion WFL  LOWER EXTREMITY ROM:   WNL for tasks assessed    Right eval Left eval  Hip flexion    Hip extension    Hip abduction    Hip adduction    Hip internal rotation    Hip  external rotation    Knee flexion    Knee extension    Ankle dorsiflexion    Ankle plantarflexion    Ankle inversion    Ankle eversion     (Blank rows = not tested)  LOWER EXTREMITY MMT:    MMT Right eval Left eval  Hip flexion 3 3+  Hip extension    Hip abduction 4 4-  Hip adduction 4+ 4+  Hip internal rotation    Hip external rotation    Knee flexion 4 4+  Knee extension 4+ 5  Ankle dorsiflexion 3- 3-  Ankle plantarflexion    Ankle inversion    Ankle eversion     (Blank rows = not tested)    FUNCTIONAL TESTS:  5 times sit to stand: 10.03 sec with B UE support and does not hold the stand (falls bwd) Timed up and go (TUG): 17.9 sec  GAIT: Distance walked: 40 Assistive device utilized: [employer] - 4 wheeled Level of assistance: Modified independence and SBA Comments: flexed trunk, one incidence of catching his toe  TREATMENT DATE:  01/20/24:  Seated marching: 2x10 Long arc quads: 5 hold 2x10 each with 2.5# added  Nustep Level 5x 8 minutes-PT present to discuss progress Seated hamstring stretch- 3x20 seconds   Seated row with green band 2x10 Weightshifting at counter -lateral and anterior/posterior x10 Standing hip abduction and extension at barre 2.5# added 2x10 bil each Standing march 2 x10 with UE support- 2.5# added Alternating step taps on 4 step 2x10 bil with 2.5# ankle weights added    01/09/24:  Seated marching: 2x10 Long arc quads: 5 hold 2x10 each with 2# added  Nustep Level 5x 6 minutes-PT present to discuss progress Seated hamstring stretch- 3x20 seconds   Seated row with green band 2x10 Weightshifting at counter -lateral and anterior/posterior x10 Standing hip abduction and extension at barre 2x5  Standing march 2 x10 with UE support Shoulder flexion 2# added 2x10 bil each    01/07/24:  Seated marching: 2x10 Long  arc quads: 5 hold x10 each  Nustep Level 5x 6 minutes-PT present to discuss progress Seated hamstring stretch- 3x20 seconds   Seated ER with band red 2x10 Weightshifting at counter -lateral and anterior/posterior x10 Standing march x10 with UE support Biceps curls: 5# 2x10 Seated row: red band 2x10    PATIENT EDUCATION:  Education details: PT eval findings, anticipated POC, initial HEP, gait safety with RW at all times, role of TPDN, and discussion of stenosis and posture and possible trial of aquatics.   Person educated: Patient and Child(ren) Education method: Explanation, Demonstration, Tactile cues, Verbal cues, and Handouts Education comprehension: verbalized understanding and returned demonstration  HOME EXERCISE PROGRAM: Access Code: [email or web address] URL: [email or web address] Date: 01/07/2024 Prepared by: Burnard  Exercises - Sit to Stand with Counter Support  - 3 x daily - 7 x weekly - 1 sets - 10 reps - Seated Hip Abduction with Resistance  - 1 x daily - 7 x weekly - 2 sets - 10 reps - Seated March  - 1 x daily - 7 x weekly - 2 sets - 10 reps - Seated Long Arc Quad  - 2 x daily - 7 x weekly - 1 sets - 10 reps - 5 hold - Seated Hamstring Stretch  - 3 x daily - 7 x weekly - 1 sets - 3 reps - 20 hold - Seated Bilateral Shoulder External Rotation with Resistance  - 2 x daily - 7 x weekly - 2 sets - 10 reps - Seated Bicep Curls Supinated with Dumbbells  - 2 x daily - 7 x weekly - 1-2 sets - 10 reps - Side to Side Weight Shift with Unilateral Counter Support  - 1 x daily - 7 x weekly - 3 sets - 10 reps - Standing March with Counter Support  - 1 x daily - 7 x weekly - 2 sets - 10 reps - Stride Stance Weight Shift  - 1 x daily - 7 x weekly - 1 sets - 10 reps - Seated Figure 4 Piriformis Stretch  - 3 x daily - 7 x weekly - 1 sets - 3 reps - 20 hold ASSESSMENT:  CLINICAL IMPRESSION: Pt continues to report LBP and Lt leg pain.  Lapse in treatment due to transportation and  need to be out of town.  He is compliant with his HEP and is remaining as active as possible.    He would like to work on his overall mobility and strength to prepare for surgery.  Pt did well with addition of weights and did more reps  of standing exercises with addition of weights today. He required rest breaks due to limited standing tolerance and had some pain with weight shifting on pad. PT monitored for pain and modified program accordingly.   He will benefit from skilled PT to address these deficits and those listed below.   OBJECTIVE IMPAIRMENTS: decreased activity tolerance, decreased balance, decreased endurance, difficulty walking, decreased ROM, decreased strength, decreased safety awareness, increased muscle spasms, impaired flexibility, postural dysfunction, and pain.   ACTIVITY LIMITATIONS: standing, sleeping, stairs, transfers, bed mobility, bathing, and locomotion level  PARTICIPATION LIMITATIONS: meal prep, cleaning, laundry, driving, shopping, and community activity  PERSONAL FACTORS: Age, Fitness, Time since onset of injury/illness/exacerbation, and 3+ comorbidities:  Peripheral neuropathy, TKR bilat, A-fib, CHF, DM are also affecting patient's functional outcome.   REHAB POTENTIAL: Good  CLINICAL DECISION MAKING: Evolving/moderate complexity  EVALUATION COMPLEXITY: Moderate   GOALS: Goals reviewed with patient? Yes  SHORT TERM GOALS: Target date: 01/07/2024   Patient will be independent with initial HEP.  Baseline: 01/09/24 Goal status: MET   2.  Patient will report decreased left hip pain with sleeping by 50%.  Baseline:  Goal status: INITIAL  3.  Patient compliant with use of 4WW at home on main floor. Baseline: 01/09/24 Goal status: MET   LONG TERM GOALS: Target date: 02/04/2024   Patient will be independent with advanced/ongoing HEP to improve outcomes and carryover.  Baseline:  Goal status: INITIAL  2.  Patient will report 70% improvement in pain with  ADLs to improve QOL.  Baseline:  Goal status: INITIAL  3.  Patient will able to step up on 8 inch step with one UE assist to reach stair lift easier. Baseline:  Goal status: INITIAL  4.  Patient will demonstrate improved functional strength by being able to complete 5XSTS without UE assist. Baseline:  Goal status: INITIAL  5.  Patient will score 3 or higher on PSFS demonstrating improved functional ability.  Baseline: 0.7 Goal status: INITIAL  6.  Patient able to stand to do his dishes .  Baseline: Lt LE pain due to radiculopathy- working on more endurance with PT exercises (01/20/24) Goal status: In progress      PLAN:  PT FREQUENCY: 2x/week  PT DURATION: 8 weeks  PLANNED INTERVENTIONS: 97164- PT Re-evaluation, 97110-Therapeutic exercises, 97530- Therapeutic activity, 97112- Neuromuscular re-education, 97535- Self Care, 02859- Manual therapy, [phone]- Gait training, [id number]- Aquatic Therapy, [phone]- Electrical stimulation (unattended), [id number]- Ultrasound, [id number]- Ionotophoresis 4mg /ml Dexamethasone , 79439 (1-2 muscles), 20561 (3+ muscles)- Dry Needling, Patient/Family education, Balance training, Stair training, Taping, Joint mobilization, Spinal mobilization, DME instructions, Cryotherapy, and Moist heat.  PLAN FOR NEXT SESSION: work on mobility and strength exercises to prepare for lumbar surgery in December    Burnard Joy, PT 01/20/24 11:48 AM

## 2024-01-21 ENCOUNTER — Ambulatory Visit: Payer: Self-pay

## 2024-01-21 NOTE — Telephone Encounter (Signed)
 FYI Only or Action Required?: FYI only for provider: appointment scheduled on 01/22/24.  Patient was last seen in primary care on 11/26/2023 by Theophilus Andrews, Tully GRADE, MD.  Called Nurse Triage reporting Leg Injury.  Symptoms began several days ago.  Interventions attempted: Rest, hydration, or home remedies.  Symptoms are: gradually worsening.  Triage Disposition: See Physician Within 24 Hours  Patient/caregiver understands and will follow disposition?: Yes          Copied from CRM #[id number]. Topic: Clinical - Red Word Triage >> Jan 21, 2024  8:06 AM Frederich PARAS wrote: [name] that prompted transfer to Nurse Triage: possible infection , pain , cut  pt getting surgery in 2 weeks, cut on right leg , looks like a chunk was taken out, leaking fluid past couple days, its red, pain, causing discomfort. Reason for Disposition  [1] MODERATE pain (e.g., interferes with normal activities, limping) AND [2] high-risk adult (e.g., age > 60 years, osteoporosis, chronic steroid use)  Answer Assessment - Initial Assessment Questions 1. MECHANISM: How did the injury happen? (e.g., twisting injury, direct blow)      Reports got a chunk taken out of his leg while getting something in the kitchen, 2. ONSET: When did the injury happen? (e.g., minutes, hours ago)      The other day 3. LOCATION: Where is the injury located?      R leg 4. APPEARANCE of INJURY: What does the injury look like?  (e.g., deformity of leg)     Chunk taken out 5. SEVERITY: Can you put weight on that leg? Can you walk?      yes 6. SIZE: For cuts, bruises, or swelling, ask: How large is it? (e.g., inches or centimeters)      Red and oozing - size of a quarter 7. PAIN: Is there pain? If Yes, ask: How bad is the pain?   What does it keep you from doing? (Scale 0-10; or none, mild, moderate, severe)     Painful when walking - limping 8. TETANUS: For any breaks in the skin, ask: When was your last  tetanus booster?     *No Answer* 9. OTHER SYMPTOMS: Do you have any other symptoms?      denies 10. PREGNANCY: Is there any chance you are pregnant? When was your last menstrual period?       N/a  Protocols used: Leg Injury-A-AH

## 2024-01-21 NOTE — Telephone Encounter (Signed)
 noted

## 2024-01-22 ENCOUNTER — Encounter: Payer: Self-pay | Admitting: Family Medicine

## 2024-01-22 ENCOUNTER — Ambulatory Visit: Admitting: Family Medicine

## 2024-01-22 ENCOUNTER — Ambulatory Visit

## 2024-01-22 VITALS — BP 118/64 | HR 67 | Temp 98.6°F | Wt 172.0 lb

## 2024-01-22 DIAGNOSIS — S80811A Abrasion, right lower leg, initial encounter: Secondary | ICD-10-CM | POA: Diagnosis not present

## 2024-01-22 DIAGNOSIS — R296 Repeated falls: Secondary | ICD-10-CM

## 2024-01-22 DIAGNOSIS — M6281 Muscle weakness (generalized): Secondary | ICD-10-CM

## 2024-01-22 DIAGNOSIS — R2681 Unsteadiness on feet: Secondary | ICD-10-CM | POA: Diagnosis not present

## 2024-01-22 DIAGNOSIS — L03115 Cellulitis of right lower limb: Secondary | ICD-10-CM | POA: Diagnosis not present

## 2024-01-22 DIAGNOSIS — M5459 Other low back pain: Secondary | ICD-10-CM

## 2024-01-22 MED ORDER — CEPHALEXIN 500 MG PO CAPS: 500.0000 mg | ORAL_CAPSULE | Freq: Four times a day (QID) | ORAL | 0 refills | Status: AC

## 2024-01-22 NOTE — Progress Notes (Signed)
   Subjective:    Patient ID: Charles Lloyd Juanito Mickey., male    DOB: [birth year], 83 y.o.   MRN: [id number]  HPI Here with his son to check a wound on the right lower leg. 4 days ago at home he lost his balance and fell against his rollator walker. This made a small abrasion on his leg, and he began treating it daily with antibiotic ointment and a Bandaid. He has also taken several days of  Doxycycline  that he had at home. In spite of this, the area has become red and swollen and tender. He has not had a fever.    Review of Systems  Constitutional: Negative.   Respiratory: Negative.    Cardiovascular: Negative.   Skin:  Positive for wound.       Objective:   Physical Exam Constitutional:      Comments: Walks with his walker   Cardiovascular:     Rate and Rhythm: Normal rate and regular rhythm.     Pulses: Normal pulses.     Heart sounds: Normal heart sounds.  Pulmonary:     Effort: Pulmonary effort is normal.     Breath sounds: Normal breath sounds.  Skin:    Comments: There is a superficial 1.5 cm abrasion on the right medial lower leg. The area around this is red, warm, swollen, and tender. There is no drainage from the wound   Neurological:     Mental Status: He is alert.           Assessment & Plan:  He has developed cellulitis from a leg abrasion. We will sotp the Doxycycline  and start Keflex  500 mg QID for 10 days. He will recheck as needed. Garnette Olmsted, MD

## 2024-01-22 NOTE — Therapy (Signed)
 OUTPATIENT PHYSICAL THERAPY TREATMENT   Patient Name: Charles Lloyd. MRN: [id number] DOB:[birth year], 83 y.o., male Today's Date: 01/22/2024  END OF SESSION:  PT End of Session - 01/22/24 1152     Visit Number 6    Date for Recertification  02/04/24    Authorization Type [employer] MCR 16 visits 10/14 to 12/9    Authorization - Visit Number 6    Authorization - Number of Visits 16    Progress Note Due on Visit 10    PT Start Time 1101    PT Stop Time 1144    PT Time Calculation (min) 43 min    Activity Tolerance Patient tolerated treatment well    Behavior During Therapy WFL for tasks assessed/performed               Past Medical History:  Diagnosis Date   Acute tracheobronchitis 01/05/2019   B12 deficiency    Basal cell carcinoma (BCC) of left side of nose 01/28/2018   Cancer (HCC)    Cardiomyopathy (HCC) 03/10/2018   With chronic atrial fibrillation   CHF (congestive heart failure) (HCC) 04/16/2013   Functional class II, ejection fraction 35-40%  Formatting of this note might be different from the original. Functional class II, ejection fraction 35-40%   Chronic anticoagulation    Chronic diastolic (congestive) heart failure (HCC) 04/16/2013   Functional class II, ejection fraction 35-40%  Last Assessment & Plan:  Clinically stable Volume well controlled meds reviewed   Chronic diastolic CHF (congestive heart failure) (HCC) 04/16/2013   Functional class II, ejection fraction 35-40%  Last Assessment & Plan:  Clinically stable Volume well controlled meds reviewed   Colon polyps 04/16/2013   Coronary artery disease involving native coronary artery of native heart without angina pectoris 04/05/2022   Myoview  05/25/2019: EF 60, normal perfusion, low risk CCTA (AF protocol) 04/25/20: CAC score 4242 (99th percentile)   Diabetic neuropathy (HCC)    Eosinophil count raised 02/17/2019   Essential hypertension 01/06/2010   Last Assessment & Plan:  Well controlled Continue med  management   Gout    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  Steroid usage was discontinued several months ago.  Follow up with Dermatology as planned.   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   Last Assessment & Plan:  Id following ?ongoing abx managementy reported Request records   Infection of prosthetic right knee joint    Insomnia 03/04/2012   Grief with loss of wife 02/20/12  Last Assessment & Plan:  Improved sx  contineu xanax prn Se discussed   Lesion of liver 04/20/2019   -On cardiac CT 02/2019   Mild reactive airways disease    Neoplasm of prostate 04/16/2013   Neoplasm of prostate, 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 Transthoracic Echocardiogram-03/10/2013-[facility]: Normal left ventricular wall thickness and cavity size.  Global left ventricular systolic function is moderately reduced.  The estimated ejection fraction is 35-40%.  The left atrium is moderately enlarged.  The right atrium is mildly enlarged.  No significant valvular    Overweight (BMI 25.0-29.9) 10/22/2016   Persistent atrial fibrillation (HCC) 04/16/2013   Last Assessment & Plan:  Rate controlled Continue med management eliquis  for stroke preventino  Formatting of this note might be different from the original.  Drug  HX Current Rx  Pre-ABL inefficacy Pre-ABL intolerant Post-ABL inefficacy Post-ABL intolerant max dose/24h M/Y end comments  sotalol                  dofetilide                  flecainide                   propafenone                  am   S/P TKR (total knee replacement), bilateral    Secondary hypercoagulable state 04/09/2019   Tinea cruris 04/16/2013   Type 2 diabetes mellitus with diabetic polyneuropathy, without long-term current use of insulin  (HCC) 04/16/2013   Last Assessment & Plan:  Labs today Pt reports well controlled on ambulatory monitoring   Vitamin D  deficiency 07/25/2018   Past Surgical History:  Procedure Laterality Date   ATRIAL FIBRILLATION ABLATION N/A 03/12/2019   Procedure: ATRIAL FIBRILLATION ABLATION;  Surgeon: Inocencio Soyla Lunger, MD;  Location: [facility] INVASIVE CV [facility];  Service: Cardiovascular;  Laterality: N/A;   ATRIAL FIBRILLATION ABLATION N/A 04/29/2020   Procedure: ATRIAL FIBRILLATION ABLATION;  Surgeon: Inocencio Soyla Lunger, MD;  Location: [facility] INVASIVE CV [facility];  Service: Cardiovascular;  Laterality: N/A;   CARDIAC ELECTROPHYSIOLOGY STUDY AND ABLATION     CARDIOVERSION     CARDIOVERSION N/A 10/10/2018   Procedure: CARDIOVERSION;  Surgeon: Delford Maude BROCKS, MD;  Location: [facility];  Service: Cardiovascular;  Laterality: N/A;   CARDIOVERSION N/A 05/18/2020   Procedure: CARDIOVERSION;  Surgeon: Alveta Aleene PARAS, MD;  Location: [facility] ENDOSCOPY;  Service: Cardiovascular;  Laterality: N/A;   CARDIOVERSION N/A 12/09/2020   Procedure: CARDIOVERSION;  Surgeon: Pietro Redell RAMAN, MD;  Location: [facility] ENDOSCOPY;  Service: Cardiovascular;  Laterality: N/A;   CHOLECYSTECTOMY     PROSTATECTOMY     REPLACEMENT TOTAL KNEE BILATERAL     Patient Active Problem List   Diagnosis Date Noted   Bilateral leg pain 11/29/2023   Acute posthemorrhagic anemia 06/21/2022   Bilateral lower extremity edema 04/06/2022   Coronary artery disease involving native coronary artery of native heart without angina pectoris  04/05/2022   Gout 02/27/2022   Acute renal failure superimposed on stage 3b chronic kidney disease (HCC) 11/16/2021   Uremia 11/16/2021   Gout due to renal impairment 10/17/2021   Pancytopenia (HCC) 08/05/2021   SIRS (systemic inflammatory response syndrome) (HCC) 08/04/2021   Chronic kidney disease, stage 3b (HCC) 12/30/2020   Multiple falls    Spinal stenosis    S/P TKR (total knee replacement), bilateral    Arthralgia 11/20/2019   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   Lesion of liver 04/20/2019   Secondary hypercoagulable state 04/09/2019   Eosinophil count raised 02/17/2019   Acute tracheobronchitis 01/05/2019   Asthma    Vitamin D  deficiency 07/25/2018   Chronic anticoagulation 03/06/2018   Hyperlipidemia associated with type 2 diabetes mellitus (HCC) 01/28/2018   Basal cell carcinoma (BCC) of left side of nose 01/28/2018   Infected prosthetic knee joint  06/24/2017   Artificial knee joint present 05/20/2017   Left knee pain 05/07/2017   B12 deficiency 10/22/2016   Overweight (BMI 25.0-29.9) 10/22/2016   Primary osteoarthritis of right knee 03/29/2015   Osteoarthritis of right knee 03/29/2015   History of cholecystectomy 11/20/2014   Occult blood in stools 02/09/2014   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   Paroxysmal A-fib (HCC) 04/16/2013   Chronic heart failure with preserved ejection fraction (HFpEF) (HCC) 04/16/2013   Colon polyps 04/16/2013   NIDDM-2 with polyneuropathy and hyperglycemia 04/16/2013   Tinea cruris 04/16/2013   Diabetic neuropathy (HCC) 07/14/2012   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) 07/14/2012   Insomnia 03/04/2012   History of cardiovascular disorder 08/16/2011   Hypertensive heart disease with heart failure (HCC) 01/06/2010   Neoplasm of prostate, malignant (HCC) 01/06/2010   Hypercholesterolemia 01/06/2010   Essential hypertension 01/06/2010    PCP: Theophilus Andrews, Tully GRADE, MD   REFERRING PROVIDER: Darnella Dorn SAUNDERS,  MD   REFERRING DIAG: [phone] (ICD-10-CM) - Spinal stenosis, lumbar region with neurogenic claudication   Rationale for Evaluation and Treatment: Rehabilitation  THERAPY DIAG:  Unsteadiness on feet  Muscle weakness (generalized)  Other low back pain  Repeated falls  ONSET DATE: September 2024  SUBJECTIVE:                                                                                                                                                                                           SUBJECTIVE STATEMENT: I only slept about an hour last night due to pain.  I got a cut on my Rt ankle and will see the MD today to have them check it.     Recent fall Friday morning. A lot of pain in his rear end since the fall in September. Since that time he sits a lot and has gotten weaker and weaker. Injections in back every 3 months. Would like to get back to walking short distances at [employer]. Feels taser shot in both legs intermittently. Hasn't slept in 3-4 weeks. Rolls over and feels it. Cannot stand and do dishes, cannot drive x 2 months. Has to be holding onto something for balance. Has chair lift at home.  Doing a bone density scan to see if surgery is viable. Trying chiropractor as well.   Accompanied by son  PERTINENT HISTORY:  Lumbar anterolisthesis, Peripheral neuropathy, TKR bilat, A-fib, CHF, DM   PAIN: 01/20/24 Are you having pain? Yes: NPRS scale: 2-8/10 Pain location: Lt  buttocks to post knees Pain description: taser Aggravating factors: unsure, standing Relieving factors: nothing, sitting   PRECAUTIONS: Fall  RED FLAGS: None  WEIGHT BEARING RESTRICTIONS: No  FALLS:  Has patient fallen in last 6 months? Yes. Number of falls 5  LIVING ENVIRONMENT:  Lives with: lives alone and family is near by and can help him easily  Lives in: House/apartment Stairs: two story townhouse with stair lift inside, has one step going up to chair lift Has following equipment at  home: Single point cane, Walker - 4 wheeled, and stair lift, raised toilet seats, shower stool   OCCUPATION: retired  PLOF: Independent with basic ADLs  PATIENT GOALS: Get back to independence  NEXT MD VISIT:   OBJECTIVE:  Note: Objective measures were completed at Evaluation unless otherwise noted.  DIAGNOSTIC FINDINGS:  MRI 1. Lumbar spondylosis and degenerative disc disease, causing markedly severe impingement at L4-5; prominent impingement at L2-3; and moderate to prominent impingement at L3-4. This impingement is roughly similar to the prior MRI exam from 08/04/2021. 2. Transitional lumbosacral vertebra is assumed to represent the S1 level. This conforms to the segmentation utilized on prior exams. Careful correlation with this numbering strategy prior to any procedural intervention would be recommended.  PATIENT SURVEYS:  THE PATIENT SPECIFIC FUNCTIONAL SCALE  Place score of 0-10 (0 = unable to perform activity and 10 = able to perform activity at the same level as before injury or problem)  Activity Date: 12/10/23    Standing to do dishes 0    2. Sleeping nightly  2    3 . Shopping 1x/wk 0    4.      Total Score 0.7      Total Score = Sum of activity scores/number of activities  Minimally Detectable Change: 3 points (for single activity); 2 points (for average score)  [facility] (nd). The Patient Specific Functional Scale . Retrieved from [email or web address]   COGNITION: Overall cognitive status: Within functional limits for tasks assessed     SENSATION: Neuropathy from knees down B  MUSCLE LENGTH: NT  POSTURE: rounded shoulders, forward head, and flexed trunk   PALPATION: Left gluteals piriformis  LUMBAR ROM: pain with neutral spine, flexion WFL  LOWER EXTREMITY ROM:   WNL for tasks assessed    Right eval Left eval  Hip flexion    Hip extension    Hip abduction    Hip adduction     Hip internal rotation    Hip external rotation    Knee flexion    Knee extension    Ankle dorsiflexion    Ankle plantarflexion    Ankle inversion    Ankle eversion     (Blank rows = not tested)  LOWER EXTREMITY MMT:    MMT Right eval Left eval  Hip flexion 3 3+  Hip extension    Hip abduction 4 4-  Hip adduction 4+ 4+  Hip internal rotation    Hip external rotation    Knee flexion 4 4+  Knee extension 4+ 5  Ankle dorsiflexion 3- 3-  Ankle plantarflexion    Ankle inversion    Ankle eversion     (Blank rows = not tested)    FUNCTIONAL TESTS:  5 times sit to stand: 10.03 sec with B UE support and does not hold the stand (falls bwd) Timed up and go (TUG): 17.9 sec 01/22/24: 17.12 seconds with rollator  GAIT: Distance walked: 40 Assistive device utilized: Walker - 4 wheeled Level of assistance: Modified independence and SBA Comments: flexed trunk, one incidence of catching his toe  TREATMENT DATE:  01/22/24:  Seated marching: 2x10 Long arc quads: 5 hold 2x10 each with 3# added  Nustep Level 5x 8 minutes-PT present to discuss progress Seated hamstring stretch- 3x20 seconds   Seated row with green band 2x10 Sidestepping at barre x2 laps  Standing hip abduction and extension at barre 2.5# added 2x10 bil each Walk to cancer rehab gym and back with rollator-focus on posture and step length.  Alternating step taps on 4 step 2x10 bil with 3# ankle weights added     01/20/24:  Seated marching: 2x10 Long arc quads: 5 hold 2x10 each with 2.5# added  Nustep Level 5x 8 minutes-PT present to discuss progress Seated hamstring stretch- 3x20 seconds   Seated row with green band 2x10 Weightshifting at counter -lateral and anterior/posterior x10 Standing hip abduction and extension at barre 2.5# added 2x10 bil each Standing march 2 x10 with UE  support- 2.5# added Alternating step taps on 4 step 2x10 bil with 2.5# ankle weights added    01/09/24:  Seated marching: 2x10 Long arc quads: 5 hold 2x10 each with 2# added  Nustep Level 5x 6 minutes-PT present to discuss progress Seated hamstring stretch- 3x20 seconds   Seated row with green band 2x10 Weightshifting at counter -lateral and anterior/posterior x10 Standing hip abduction and extension at barre 2x5  Standing march 2 x10 with UE support Shoulder flexion 2# added 2x10 bil each     PATIENT EDUCATION:  Education details: PT eval findings, anticipated POC, initial HEP, gait safety with RW at all times, role of TPDN, and discussion of stenosis and posture and possible trial of aquatics.   Person educated: Patient and Child(ren) Education method: Explanation, Demonstration, Tactile cues, Verbal cues, and Handouts Education comprehension: verbalized understanding and returned demonstration  HOME EXERCISE PROGRAM: Access Code: [email or web address] URL: [email or web address] Date: 01/07/2024 Prepared by: Burnard  Exercises - Sit to Stand with Counter Support  - 3 x daily - 7 x weekly - 1 sets - 10 reps - Seated Hip Abduction with Resistance  - 1 x daily - 7 x weekly - 2 sets - 10 reps - Seated March  - 1 x daily - 7 x weekly - 2 sets - 10 reps - Seated Long Arc Quad  - 2 x daily - 7 x weekly - 1 sets - 10 reps - 5 hold - Seated Hamstring Stretch  - 3 x daily - 7 x weekly - 1 sets - 3 reps - 20 hold - Seated Bilateral Shoulder External Rotation with Resistance  - 2 x daily - 7 x weekly - 2 sets - 10 reps - Seated Bicep Curls Supinated with Dumbbells  - 2 x daily - 7 x weekly - 1-2 sets - 10 reps - Side to Side Weight Shift with Unilateral Counter Support  - 1 x daily - 7 x weekly - 3 sets - 10 reps - Standing March with Counter Support  - 1 x daily - 7 x weekly - 2 sets - 10 reps - Stride Stance Weight Shift  - 1 x daily - 7 x weekly - 1 sets - 10 reps - Seated Figure 4  Piriformis Stretch  - 3 x daily - 7 x weekly - 1 sets - 3 reps - 20 hold ASSESSMENT:  CLINICAL IMPRESSION: Pt continues to report LBP and Lt leg pain.  He had had difficulty sleeping due to Lt leg pain.  He is compliant with his HEP and is remaining as active as possible.    He  would like to work on his overall mobility and strength to prepare for surgery.  Pt did well with advancement of weights today.  He required rest breaks due to limited standing tolerance and had some pain with weight shifting on pad. PT monitored throughout session for pain and to monitor for fatigue.   He will benefit from skilled PT to address these deficits and those listed below.   OBJECTIVE IMPAIRMENTS: decreased activity tolerance, decreased balance, decreased endurance, difficulty walking, decreased ROM, decreased strength, decreased safety awareness, increased muscle spasms, impaired flexibility, postural dysfunction, and pain.   ACTIVITY LIMITATIONS: standing, sleeping, stairs, transfers, bed mobility, bathing, and locomotion level  PARTICIPATION LIMITATIONS: meal prep, cleaning, laundry, driving, shopping, and community activity  PERSONAL FACTORS: Age, Fitness, Time since onset of injury/illness/exacerbation, and 3+ comorbidities:  Peripheral neuropathy, TKR bilat, A-fib, CHF, DM are also affecting patient's functional outcome.   REHAB POTENTIAL: Good  CLINICAL DECISION MAKING: Evolving/moderate complexity  EVALUATION COMPLEXITY: Moderate   GOALS: Goals reviewed with patient? Yes  SHORT TERM GOALS: Target date: 01/07/2024   Patient will be independent with initial HEP.  Baseline: 01/09/24 Goal status: MET   2.  Patient will report decreased left hip pain with sleeping by 50%.  Baseline:  Goal status: INITIAL  3.  Patient compliant with use of 4WW at home on main floor. Baseline: 01/09/24 Goal status: MET   LONG TERM GOALS: Target date: 02/04/2024   Patient will be independent with  advanced/ongoing HEP to improve outcomes and carryover.  Baseline:  Goal status: INITIAL  2.  Patient will report 70% improvement in pain with ADLs to improve QOL.  Baseline:  Goal status: INITIAL  3.  Patient will able to step up on 8 inch step with one UE assist to reach stair lift easier. Baseline:  Goal status: INITIAL  4.  Patient will demonstrate improved functional strength by being able to complete 5XSTS without UE assist. Baseline:  Goal status: INITIAL  5.  Patient will score 3 or higher on PSFS demonstrating improved functional ability.  Baseline: 0.7 Goal status: INITIAL  6.  Patient able to stand to do his dishes .  Baseline: Lt LE pain due to radiculopathy- working on more endurance with PT exercises (01/20/24) Goal status: In progress      PLAN:  PT FREQUENCY: 2x/week  PT DURATION: 8 weeks  PLANNED INTERVENTIONS: 97164- PT Re-evaluation, 97110-Therapeutic exercises, 97530- Therapeutic activity, 97112- Neuromuscular re-education, 97535- Self Care, [id number]- Manual therapy, [phone]- Gait training, [phone]- Aquatic Therapy, [phone]- Electrical stimulation (unattended), [id number]- Ultrasound, [id number]- Ionotophoresis 4mg /ml Dexamethasone , 79439 (1-2 muscles), 20561 (3+ muscles)- Dry Needling, Patient/Family education, Balance training, Stair training, Taping, Joint mobilization, Spinal mobilization, DME instructions, Cryotherapy, and Moist heat.  PLAN FOR NEXT SESSION: work on mobility and strength exercises to prepare for lumbar surgery in December    Burnard Joy, PT 01/22/24 11:55 AM

## 2024-01-26 NOTE — Therapy (Incomplete)
 OUTPATIENT PHYSICAL THERAPY TREATMENT   Patient Name: Charles Lloyd. MRN: [id number] DOB:18-Oct-1940, 83 y.o., male Today's Date: 01/26/2024  END OF SESSION:         Past Medical History:  Diagnosis Date   Acute tracheobronchitis 01/05/2019   B12 deficiency    Basal cell carcinoma (BCC) of left side of nose 01/28/2018   Cancer (HCC)    Cardiomyopathy (HCC) 03/10/2018   With chronic atrial fibrillation   CHF (congestive heart failure) (HCC) 04/16/2013   Functional class II, ejection fraction 35-40%  Formatting of this note might be different from the original. Functional class II, ejection fraction 35-40%   Chronic anticoagulation    Chronic diastolic (congestive) heart failure (HCC) 04/16/2013   Functional class II, ejection fraction 35-40%  Last Assessment & Plan:  Clinically stable Volume well controlled meds reviewed   Chronic diastolic CHF (congestive heart failure) (HCC) 04/16/2013   Functional class II, ejection fraction 35-40%  Last Assessment & Plan:  Clinically stable Volume well controlled meds reviewed   Colon polyps 04/16/2013   Coronary artery disease involving native coronary artery of native heart without angina pectoris 04/05/2022   Myoview  05/25/2019: EF 60, normal perfusion, low risk CCTA (AF protocol) 04/25/20: CAC score 4242 (99th percentile)   Diabetic neuropathy (HCC)    Eosinophil count raised 02/17/2019   Essential hypertension 01/06/2010   Last Assessment & Plan:  Well controlled Continue med management   Gout    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  Steroid usage was discontinued several months ago.  Follow up with Dermatology as planned.   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   Last Assessment & Plan:  Id following ?ongoing abx managementy reported Request records   Infection of prosthetic right knee joint    Insomnia 03/04/2012   Grief with loss of wife 02/20/12  Last Assessment & Plan:  Improved sx  contineu xanax prn Se discussed   Lesion of liver 04/20/2019   -On cardiac CT 02/2019   Mild reactive airways disease    Neoplasm of prostate 04/16/2013   Neoplasm of prostate, 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 Transthoracic Echocardiogram-03/10/2013-[facility]: Normal left ventricular wall thickness and cavity size.  Global left ventricular systolic function is moderately reduced.  The estimated ejection fraction is 35-40%.  The left atrium is moderately enlarged.  The right atrium is mildly enlarged.  No significant valvular    Overweight (BMI 25.0-29.9) 10/22/2016   Persistent atrial fibrillation (HCC) 04/16/2013   Last Assessment & Plan:  Rate controlled Continue med management eliquis  for stroke preventino  Formatting of this note might be different from the original.  Drug  HX Current Rx Pre-ABL inefficacy Pre-ABL intolerant Post-ABL inefficacy Post-ABL intolerant max dose/24h M/Y end comments  sotalol  dofetilide                  flecainide                   propafenone                  am   S/P TKR (total knee replacement), bilateral    Secondary hypercoagulable state 04/09/2019   Tinea cruris 04/16/2013   Type 2 diabetes mellitus with diabetic polyneuropathy, without long-term current use of insulin  (HCC) 04/16/2013    Last Assessment & Plan:  Labs today Pt reports well controlled on ambulatory monitoring   Vitamin D  deficiency 07/25/2018   Past Surgical History:  Procedure Laterality Date   ATRIAL FIBRILLATION ABLATION N/A 03/12/2019   Procedure: ATRIAL FIBRILLATION ABLATION;  Surgeon: Inocencio Soyla Lunger, MD;  Location: [facility] INVASIVE [facility];  Service: Cardiovascular;  Laterality: N/A;   ATRIAL FIBRILLATION ABLATION N/A 04/29/2020   Procedure: ATRIAL FIBRILLATION ABLATION;  Surgeon: Inocencio Soyla Lunger, MD;  Location: [facility];  Service: Cardiovascular;  Laterality: N/A;   CARDIAC ELECTROPHYSIOLOGY STUDY AND ABLATION     CARDIOVERSION     CARDIOVERSION N/A 10/10/2018   Procedure: CARDIOVERSION;  Surgeon: Delford Maude BROCKS, MD;  Location[facility] ENDOSCOPY;  Service: Cardiovascular;  Laterality: N/A;   CARDIOVERSION N/A 05/18/2020   Procedure: CARDIOVERSION;  Surgeon: Alveta Aleene PARAS, MD;  Location: [facility] ENDOSCOPY;  Service: Cardiovascular;  Laterality: N/A;   CARDIOVERSION N/A 12/09/2020   Procedure: CARDIOVERSION;  Surgeon: Pietro Redell RAMAN, MD;  Location: [facility] ENDOSCOPY;  Service: Cardiovascular;  Laterality: N/A;   CHOLECYSTECTOMY     PROSTATECTOMY     REPLACEMENT TOTAL KNEE BILATERAL     Patient Active Problem List   Diagnosis Date Noted   Bilateral leg pain 11/29/2023   Acute posthemorrhagic anemia 06/21/2022   Bilateral lower extremity edema 04/06/2022   Coronary artery disease involving native coronary artery of native heart without angina pectoris 04/05/2022   Gout 02/27/2022   Acute renal failure superimposed on stage 3b chronic kidney disease (HCC) 11/16/2021   Uremia 11/16/2021   Gout due to renal impairment 10/17/2021   Pancytopenia (HCC) 08/05/2021   SIRS (systemic inflammatory response syndrome) (HCC) 08/04/2021   Chronic kidney disease, stage 3b (HCC) 12/30/2020   Multiple falls    Spinal stenosis    S/P TKR (total knee replacement), bilateral    Arthralgia 11/20/2019   Allergic  rhinitis 09/07/2019   Lesion of liver 04/20/2019   Secondary hypercoagulable state 04/09/2019   Eosinophil count raised 02/17/2019   Acute tracheobronchitis 01/05/2019   Asthma    Vitamin D  deficiency 07/25/2018   Chronic anticoagulation 03/06/2018   Hyperlipidemia associated with type 2 diabetes mellitus (HCC) 01/28/2018   Basal cell carcinoma (BCC) of left side of nose 01/28/2018   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   Artificial knee joint present 05/20/2017   Left knee pain 05/07/2017   B12 deficiency 10/22/2016   Overweight (BMI 25.0-29.9) 10/22/2016   Primary osteoarthritis of right knee 03/29/2015   Osteoarthritis of right knee 03/29/2015   History of cholecystectomy 11/20/2014   Occult blood in stools 02/09/2014   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   Paroxysmal A-fib (HCC) 04/16/2013   Chronic heart failure with preserved ejection fraction (HFpEF) (HCC) 04/16/2013   Colon polyps 04/16/2013   NIDDM-2 with polyneuropathy and hyperglycemia 04/16/2013   Tinea cruris 04/16/2013   Diabetic neuropathy (HCC) 07/14/2012   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) 07/14/2012  Insomnia 03/04/2012   History of cardiovascular disorder 08/16/2011   Hypertensive heart disease with heart failure (HCC) 01/06/2010   Neoplasm of prostate, malignant (HCC) 01/06/2010   Hypercholesterolemia 01/06/2010   Essential hypertension 01/06/2010    PCP: Theophilus Andrews, Tully GRADE, MD   REFERRING PROVIDER: Darnella Dorn SAUNDERS, MD   REFERRING DIAG: [phone] (ICD-10-CM) - Spinal stenosis, lumbar region with neurogenic claudication   Rationale for Evaluation and Treatment: Rehabilitation  THERAPY DIAG:  No diagnosis found.  ONSET DATE: September 2024  SUBJECTIVE:                                                                                                                                                                                           SUBJECTIVE  STATEMENT: ***    Eval: Recent fall Friday morning. A lot of pain in his rear end since the fall in September. Since that time he sits a lot and has gotten weaker and weaker. Injections in back every 3 months. Would like to get back to walking short distances at [employer]. Feels taser shot in both legs intermittently. Hasn't slept in 3-4 weeks. Rolls over and feels it. Cannot stand and do dishes, cannot drive x 2 months. Has to be holding onto something for balance. Has chair lift at home.  Doing a bone density scan to see if surgery is viable. Trying chiropractor as well.   Accompanied by son  PERTINENT HISTORY:  Lumbar anterolisthesis, Peripheral neuropathy, TKR bilat, A-fib, CHF, DM   PAIN: 01/20/24 Are you having pain? Yes: NPRS scale: 2-8/10 Pain location: Lt  buttocks to post knees Pain description: taser Aggravating factors: unsure, standing Relieving factors: nothing, sitting   PRECAUTIONS: Fall  RED FLAGS: None   WEIGHT BEARING RESTRICTIONS: No  FALLS:  Has patient fallen in last 6 months? Yes. Number of falls 5  LIVING ENVIRONMENT:  Lives with: lives alone and family is near by and can help him easily  Lives in: House/apartment Stairs: two story townhouse with stair lift inside, has one step going up to chair lift Has following equipment at home: Single point cane, Walker - 4 wheeled, and stair lift, raised toilet seats, shower stool   OCCUPATION: retired  PLOF: Independent with basic ADLs  PATIENT GOALS: Get back to independence  NEXT MD VISIT:   OBJECTIVE:  Note: Objective measures were completed at Evaluation unless otherwise noted.  DIAGNOSTIC FINDINGS:  MRI 1. Lumbar spondylosis and degenerative disc disease, causing markedly severe impingement at L4-5; prominent impingement at L2-3; and moderate to prominent impingement at L3-4. This impingement is roughly similar to the prior MRI exam from 08/04/2021. 2.  Transitional lumbosacral vertebra is  assumed to represent the S1 level. This conforms to the segmentation utilized on prior exams. Careful correlation with this numbering strategy prior to any procedural intervention would be recommended.  PATIENT SURVEYS:  THE PATIENT SPECIFIC FUNCTIONAL SCALE  Place score of 0-10 (0 = unable to perform activity and 10 = able to perform activity at the same level as before injury or problem)  Activity Date: 12/10/23    Standing to do dishes 0    2. Sleeping nightly  2    3 . Shopping 1x/wk 0    4.      Total Score 0.7      Total Score = Sum of activity scores/number of activities  Minimally Detectable Change: 3 points (for single activity); 2 points (for average score)  [facility] (nd). The Patient Specific Functional Scale . Retrieved from [email or web address]   COGNITION: Overall cognitive status: Within functional limits for tasks assessed     SENSATION: Neuropathy from knees down B  MUSCLE LENGTH: NT  POSTURE: rounded shoulders, forward head, and flexed trunk   PALPATION: Left gluteals piriformis  LUMBAR ROM: pain with neutral spine, flexion WFL  LOWER EXTREMITY ROM:   WNL for tasks assessed    Right eval Left eval  Hip flexion    Hip extension    Hip abduction    Hip adduction    Hip internal rotation    Hip external rotation    Knee flexion    Knee extension    Ankle dorsiflexion    Ankle plantarflexion    Ankle inversion    Ankle eversion     (Blank rows = not tested)  LOWER EXTREMITY MMT:    MMT Right eval Left eval  Hip flexion 3 3+  Hip extension    Hip abduction 4 4-  Hip adduction 4+ 4+  Hip internal rotation    Hip external rotation    Knee flexion 4 4+  Knee extension 4+ 5  Ankle dorsiflexion 3- 3-  Ankle plantarflexion    Ankle inversion    Ankle eversion     (Blank rows = not tested)    FUNCTIONAL TESTS:  5 times sit to stand: 10.03 sec with B UE support and  does not hold the stand (falls bwd) Timed up and go (TUG): 17.9 sec 01/22/24: 17.12 seconds with rollator  GAIT: Distance walked: 40 Assistive device utilized: [employer] - 4 wheeled Level of assistance: Modified independence and SBA Comments: flexed trunk, one incidence of catching his toe  TREATMENT DATE:                                                                                                                                 01/27/24:  Seated marching: 2x10 Long arc quads: 5 hold 2x10 each with 3# added  Nustep Level 5x 8 minutes-PT present to discuss progress Try step ups *** Seated hamstring stretch- 3x20  seconds   Seated row with green band 2x10 Sidestepping at barre x2 laps  Standing hip abduction and extension at barre 2.5# added 2x10 bil each Walk to cancer rehab gym and back with rollator-focus on posture and step length.  Alternating step taps on 4 step 2x10 bil with 3# ankle weights added   01/22/24:  Seated marching: 2x10 Long arc quads: 5 hold 2x10 each with 3# added  Nustep Level 5x 8 minutes-PT present to discuss progress Seated hamstring stretch- 3x20 seconds   Seated row with green band 2x10 Sidestepping at barre x2 laps  Standing hip abduction and extension at barre 2.5# added 2x10 bil each Walk to cancer rehab gym and back with rollator-focus on posture and step length.  Alternating step taps on 4 step 2x10 bil with 3# ankle weights added     01/20/24:  Seated marching: 2x10 Long arc quads: 5 hold 2x10 each with 2.5# added  Nustep Level 5x 8 minutes-PT present to discuss progress Seated hamstring stretch- 3x20 seconds   Seated row with green band 2x10 Weightshifting at counter -lateral and anterior/posterior x10 Standing hip abduction and extension at barre 2.5# added 2x10 bil each Standing march 2 x10 with UE support- 2.5# added Alternating step taps on 4 step 2x10 bil with 2.5# ankle weights added    01/09/24:  Seated marching: 2x10 Long  arc quads: 5 hold 2x10 each with 2# added  Nustep Level 5x 6 minutes-PT present to discuss progress Seated hamstring stretch- 3x20 seconds   Seated row with green band 2x10 Weightshifting at counter -lateral and anterior/posterior x10 Standing hip abduction and extension at barre 2x5  Standing march 2 x10 with UE support Shoulder flexion 2# added 2x10 bil each     PATIENT EDUCATION:  Education details: PT eval findings, anticipated POC, initial HEP, gait safety with RW at all times, role of TPDN, and discussion of stenosis and posture and possible trial of aquatics.   Person educated: Patient and Child(ren) Education method: Explanation, Demonstration, Tactile cues, Verbal cues, and Handouts Education comprehension: verbalized understanding and returned demonstration  HOME EXERCISE PROGRAM: Access Code: [email or web address] URL: [email or web address] Date: 01/07/2024 Prepared by: Burnard  Exercises - Sit to Stand with Counter Support  - 3 x daily - 7 x weekly - 1 sets - 10 reps - Seated Hip Abduction with Resistance  - 1 x daily - 7 x weekly - 2 sets - 10 reps - Seated March  - 1 x daily - 7 x weekly - 2 sets - 10 reps - Seated Long Arc Quad  - 2 x daily - 7 x weekly - 1 sets - 10 reps - 5 hold - Seated Hamstring Stretch  - 3 x daily - 7 x weekly - 1 sets - 3 reps - 20 hold - Seated Bilateral Shoulder External Rotation with Resistance  - 2 x daily - 7 x weekly - 2 sets - 10 reps - Seated Bicep Curls Supinated with Dumbbells  - 2 x daily - 7 x weekly - 1-2 sets - 10 reps - Side to Side Weight Shift with Unilateral Counter Support  - 1 x daily - 7 x weekly - 3 sets - 10 reps - Standing March with Counter Support  - 1 x daily - 7 x weekly - 2 sets - 10 reps - Stride Stance Weight Shift  - 1 x daily - 7 x weekly - 1 sets - 10 reps - Seated Figure 4 Piriformis Stretch  - 3 x  daily - 7 x weekly - 1 sets - 3 reps - 20 hold ASSESSMENT:  CLINICAL IMPRESSION: *** Pt continues to report  LBP and Lt leg pain.  He had had difficulty sleeping due to Lt leg pain.  He is compliant with his HEP and is remaining as active as possible.    He would like to work on his overall mobility and strength to prepare for surgery.  Pt did well with advancement of weights today.  He required rest breaks due to limited standing tolerance and had some pain with weight shifting on pad. PT monitored throughout session for pain and to monitor for fatigue.   He will benefit from skilled PT to address these deficits and those listed below.   OBJECTIVE IMPAIRMENTS: decreased activity tolerance, decreased balance, decreased endurance, difficulty walking, decreased ROM, decreased strength, decreased safety awareness, increased muscle spasms, impaired flexibility, postural dysfunction, and pain.   ACTIVITY LIMITATIONS: standing, sleeping, stairs, transfers, bed mobility, bathing, and locomotion level  PARTICIPATION LIMITATIONS: meal prep, cleaning, laundry, driving, shopping, and community activity  PERSONAL FACTORS: Age, Fitness, Time since onset of injury/illness/exacerbation, and 3+ comorbidities:  Peripheral neuropathy, TKR bilat, A-fib, CHF, DM are also affecting patient's functional outcome.   REHAB POTENTIAL: Good  CLINICAL DECISION MAKING: Evolving/moderate complexity  EVALUATION COMPLEXITY: Moderate   GOALS: Goals reviewed with patient? Yes  SHORT TERM GOALS: Target date: 01/07/2024   Patient will be independent with initial HEP.  Baseline: 01/09/24 Goal status: MET   2.  Patient will report decreased left hip pain with sleeping by 50%.  Baseline:  Goal status: INITIAL  3.  Patient compliant with use of 4WW at home on main floor. Baseline: 01/09/24 Goal status: MET   LONG TERM GOALS: Target date: 02/04/2024   Patient will be independent with advanced/ongoing HEP to improve outcomes and carryover.  Baseline:  Goal status: INITIAL  2.  Patient will report 70% improvement in pain with  ADLs to improve QOL.  Baseline:  Goal status: INITIAL  3.  Patient will able to step up on 8 inch step with one UE assist to reach stair lift easier. Baseline:  Goal status: INITIAL  4.  Patient will demonstrate improved functional strength by being able to complete 5XSTS without UE assist. Baseline:  Goal status: INITIAL  5.  Patient will score 3 or higher on PSFS demonstrating improved functional ability.  Baseline: 0.7 Goal status: INITIAL  6.  Patient able to stand to do his dishes .  Baseline: Lt LE pain due to radiculopathy- working on more endurance with PT exercises (01/20/24) Goal status: In progress      PLAN:  PT FREQUENCY: 2x/week  PT DURATION: 8 weeks  PLANNED INTERVENTIONS: 97164- PT Re-evaluation, 97110-Therapeutic exercises, 97530- Therapeutic activity, 97112- Neuromuscular re-education, 97535- Self Care, 02859- Manual therapy, [phone]- Gait training, [id number]- Aquatic Therapy, [phone]- Electrical stimulation (unattended), [id number]- Ultrasound, [id number]- Ionotophoresis 4mg /ml Dexamethasone , 79439 (1-2 muscles), 20561 (3+ muscles)- Dry Needling, Patient/Family education, Balance training, Stair training, Taping, Joint mobilization, Spinal mobilization, DME instructions, Cryotherapy, and Moist heat.  PLAN FOR NEXT SESSION: work on mobility and strength exercises to prepare for lumbar surgery in December    Mliss Cummins, PT  01/26/24 7:26 PM

## 2024-01-27 ENCOUNTER — Ambulatory Visit: Admitting: Physical Therapy

## 2024-01-29 ENCOUNTER — Ambulatory Visit: Admitting: Physical Therapy

## 2024-02-03 ENCOUNTER — Ambulatory Visit: Admitting: Physical Therapy

## 2024-02-04 ENCOUNTER — Encounter (HOSPITAL_COMMUNITY): Payer: Self-pay

## 2024-02-04 NOTE — Pre-Procedure Instructions (Signed)
 Surgical Instructions   Your procedure is scheduled on February 06, 2024. Report to [facility] Main Entrance A at 5:30 A.M., then check in with the Admitting office. Any questions or running late day of surgery: call [phone]  Questions prior to your surgery date: call [phone], Monday-Friday, 8am-4pm. If you experience any cold or flu symptoms such as cough, fever, chills, shortness of breath, etc. between now and your scheduled surgery, please notify us  at the above number.     Remember:  Do not eat or drink after midnight the night before your surgery    Take these medicines the morning of surgery with A SIP OF WATER: allopurinol  (ZYLOPRIM )  atorvastatin  (LIPITOR)  carvedilol  (COREG )  flecainide  (TAMBOCOR )  fluticasone  (FLONASE ) nasal spray  gabapentin  (NEURONTIN )  hydrALAZINE  (APRESOLINE )  montelukast  (SINGULAIR )  RESTASIS  ophthalmic emulsion    STOP taking your apixaban  (ELIQUIS ) three days prior to surgery. Your last dose will be December 7th.   One week prior to surgery, STOP taking any Aspirin (unless otherwise instructed by your surgeon) Aleve, Naproxen, Ibuprofen, Motrin, Advil, Goody's, BC's, all herbal medications, fish oil, and non-prescription vitamins.   HOW TO MANAGE YOUR DIABETES BEFORE AND AFTER SURGERY  Why is it important to control my blood sugar before and after surgery? Improving blood sugar levels before and after surgery helps healing and can limit problems. A way of improving blood sugar control is eating a healthy diet by:  Eating less sugar and carbohydrates  Increasing activity/exercise  Talking with your doctor about reaching your blood sugar goals High blood sugars (greater than 180 mg/dL) can raise your risk of infections and slow your recovery, so you will need to focus on controlling your diabetes during the weeks before surgery. Make sure that the doctor who takes care of your diabetes knows about your planned surgery including  the date and location.  How do I manage my blood sugar before surgery? Check your blood sugar at least 4 times a day, starting 2 days before surgery, to make sure that the level is not too high or low.  Check your blood sugar the morning of your surgery when you wake up and every 2 hours until you get to the Short Stay unit.  If your blood sugar is less than 70 mg/dL, you will need to treat for low blood sugar: Do not take insulin . Treat a low blood sugar (less than 70 mg/dL) with  cup of clear juice (cranberry or apple), 4 glucose tablets, OR glucose gel. Recheck blood sugar in 15 minutes after treatment (to make sure it is greater than 70 mg/dL). If your blood sugar is not greater than 70 mg/dL on recheck, call [phone] for further instructions. Report your blood sugar to the short stay nurse when you get to Short Stay.  If you are admitted to the hospital after surgery: Your blood sugar will be checked by the staff and you will probably be given insulin  after surgery (instead of oral diabetes medicines) to make sure you have good blood sugar levels. The goal for blood sugar control after surgery is 80-180 mg/dL.                      Do NOT Smoke (Tobacco/Vaping) for 24 hours prior to your procedure.  If you use a CPAP at night, you may bring your mask/headgear for your overnight stay.   You will be asked to remove any contacts, glasses, piercing's, hearing aid's, dentures/partials prior to surgery. Please  bring cases for these items if needed.    Patients discharged the day of surgery will not be allowed to drive home, and someone needs to stay with them for 24 hours.  SURGICAL WAITING ROOM VISITATION Patients may have no more than 2 support people in the waiting area - these visitors may rotate.   Pre-op nurse will coordinate an appropriate time for 1 ADULT support person, who may not rotate, to accompany patient in pre-op.  Children under the age of 67 must have an adult with  them who is not the patient and must remain in the main waiting area with an adult.  If the patient needs to stay at the hospital during part of their recovery, the visitor guidelines for inpatient rooms apply.  Please refer to the [facility] website for the visitor guidelines for any additional information.   If you received a COVID test during your pre-op visit  it is requested that you wear a mask when out in public, stay away from anyone that may not be feeling well and notify your surgeon if you develop symptoms. If you have been in contact with anyone that has tested positive in the last 10 days please notify you surgeon.      Pre-operative CHG Bathing Instructions   You can play a key role in reducing the risk of infection after surgery. Your skin needs to be as free of germs as possible. You can reduce the number of germs on your skin by washing with CHG (chlorhexidine gluconate) soap before surgery. CHG is an antiseptic soap that kills germs and continues to kill germs even after washing.   DO NOT use if you have an allergy to chlorhexidine/CHG or antibacterial soaps. If your skin becomes reddened or irritated, stop using the CHG and notify one of our RNs at [phone].              TAKE A SHOWER THE NIGHT BEFORE SURGERY   Please keep in mind the following:  DO NOT shave, including legs and underarms, 48 hours prior to surgery.   You may shave your face before/day of surgery.  Place clean sheets on your bed the night before surgery Use a clean washcloth (not used since being washed) for shower. DO NOT sleep with pet's night before surgery.  CHG Shower Instructions:  Wash your face and private area with normal soap. If you choose to wash your hair, wash first with your normal shampoo.  After you use shampoo/soap, rinse your hair and body thoroughly to remove shampoo/soap residue.  Turn the water OFF and apply half the bottle of CHG soap to a CLEAN washcloth.  Apply CHG soap  ONLY FROM YOUR NECK DOWN TO YOUR TOES (washing for 3-5 minutes)  DO NOT use CHG soap on face, private areas, open wounds, or sores.  Pay special attention to the area where your surgery is being performed.  If you are having back surgery, having someone wash your back for you may be helpful. Wait 2 minutes after CHG soap is applied, then you may rinse off the CHG soap.  Pat dry with a clean towel  Put on clean pajamas    Additional instructions for the day of surgery: If you choose, you may shower the morning of surgery with an antibacterial soap.  DO NOT APPLY any lotions, deodorants, cologne, or perfumes.   Do not wear jewelry or makeup Do not wear nail polish, gel polish, artificial nails, or any other type  of covering on natural nails (fingers and toes) Do not bring valuables to the hospital. [facility] is not responsible for valuables/personal belongings. Put on clean/comfortable clothes.  Please brush your teeth.  Ask your nurse before applying any prescription medications to the skin.

## 2024-02-05 ENCOUNTER — Other Ambulatory Visit: Payer: Self-pay

## 2024-02-05 ENCOUNTER — Encounter (HOSPITAL_COMMUNITY): Payer: Self-pay

## 2024-02-05 ENCOUNTER — Inpatient Hospital Stay (HOSPITAL_COMMUNITY): Admission: RE | Admit: 2024-02-05 | Discharge: 2024-02-05

## 2024-02-05 ENCOUNTER — Ambulatory Visit: Admitting: Physical Therapy

## 2024-02-05 VITALS — BP 128/62 | HR 65 | Temp 97.9°F | Resp 18 | Ht 70.0 in | Wt 173.4 lb

## 2024-02-05 DIAGNOSIS — I5030 Unspecified diastolic (congestive) heart failure: Secondary | ICD-10-CM | POA: Insufficient documentation

## 2024-02-05 DIAGNOSIS — Z9079 Acquired absence of other genital organ(s): Secondary | ICD-10-CM | POA: Insufficient documentation

## 2024-02-05 DIAGNOSIS — I4891 Unspecified atrial fibrillation: Secondary | ICD-10-CM | POA: Insufficient documentation

## 2024-02-05 DIAGNOSIS — Z01812 Encounter for preprocedural laboratory examination: Secondary | ICD-10-CM | POA: Insufficient documentation

## 2024-02-05 DIAGNOSIS — Z01818 Encounter for other preprocedural examination: Secondary | ICD-10-CM

## 2024-02-05 DIAGNOSIS — Z7901 Long term (current) use of anticoagulants: Secondary | ICD-10-CM | POA: Insufficient documentation

## 2024-02-05 DIAGNOSIS — I110 Hypertensive heart disease with heart failure: Secondary | ICD-10-CM | POA: Insufficient documentation

## 2024-02-05 DIAGNOSIS — R7989 Other specified abnormal findings of blood chemistry: Secondary | ICD-10-CM | POA: Insufficient documentation

## 2024-02-05 DIAGNOSIS — Z8546 Personal history of malignant neoplasm of prostate: Secondary | ICD-10-CM | POA: Insufficient documentation

## 2024-02-05 DIAGNOSIS — D649 Anemia, unspecified: Secondary | ICD-10-CM | POA: Insufficient documentation

## 2024-02-05 DIAGNOSIS — I340 Nonrheumatic mitral (valve) insufficiency: Secondary | ICD-10-CM | POA: Insufficient documentation

## 2024-02-05 DIAGNOSIS — E119 Type 2 diabetes mellitus without complications: Secondary | ICD-10-CM | POA: Insufficient documentation

## 2024-02-05 HISTORY — DX: Cardiac arrhythmia, unspecified: I49.9

## 2024-02-05 HISTORY — DX: Failed or difficult intubation, initial encounter: T88.4XXA

## 2024-02-05 LAB — TYPE AND SCREEN
ABO/RH(D): O POS
Antibody Screen: NEGATIVE

## 2024-02-05 LAB — CBC
HCT: 32.9 % — ABNORMAL LOW (ref 39.0–52.0)
Hemoglobin: 10.7 g/dL — ABNORMAL LOW (ref 13.0–17.0)
MCH: 29.5 pg (ref 26.0–34.0)
MCHC: 32.5 g/dL (ref 30.0–36.0)
MCV: 90.6 fL (ref 80.0–100.0)
Platelets: 182 K/uL (ref 150–400)
RBC: 3.63 MIL/uL — ABNORMAL LOW (ref 4.22–5.81)
RDW: 16.5 % — ABNORMAL HIGH (ref 11.5–15.5)
WBC: 3.7 K/uL — ABNORMAL LOW (ref 4.0–10.5)
nRBC: 0 % (ref 0.0–0.2)

## 2024-02-05 LAB — GLUCOSE, CAPILLARY: Glucose-Capillary: 105 mg/dL — ABNORMAL HIGH (ref 70–99)

## 2024-02-05 LAB — COMPREHENSIVE METABOLIC PANEL WITH GFR
ALT: 20 U/L (ref 0–44)
AST: 28 U/L (ref 15–41)
Albumin: 3 g/dL — ABNORMAL LOW (ref 3.5–5.0)
Alkaline Phosphatase: 76 U/L (ref 38–126)
Anion gap: 10 (ref 5–15)
BUN: 25 mg/dL — ABNORMAL HIGH (ref 8–23)
CO2: 26 mmol/L (ref 22–32)
Calcium: 8.9 mg/dL (ref 8.9–10.3)
Chloride: 107 mmol/L (ref 98–111)
Creatinine, Ser: 1.39 mg/dL — ABNORMAL HIGH (ref 0.61–1.24)
GFR, Estimated: 51 mL/min — ABNORMAL LOW (ref >60)
Glucose, Bld: 105 mg/dL — ABNORMAL HIGH (ref 70–99)
Potassium: 3.6 mmol/L (ref 3.5–5.1)
Sodium: 143 mmol/L (ref 135–145)
Total Bilirubin: 1.2 mg/dL (ref 0.0–1.2)
Total Protein: 5.4 g/dL — ABNORMAL LOW (ref 6.5–8.1)

## 2024-02-05 LAB — HEMOGLOBIN A1C
Hgb A1c MFr Bld: 5.9 % — ABNORMAL HIGH (ref 4.8–5.6)
Mean Plasma Glucose: 122.63 mg/dL

## 2024-02-05 LAB — SURGICAL PCR SCREEN
MRSA, PCR: NEGATIVE
Staphylococcus aureus: NEGATIVE

## 2024-02-05 NOTE — Progress Notes (Signed)
°   02/05/24 1104  OBSTRUCTIVE SLEEP APNEA  Have you ever been diagnosed with sleep apnea through a sleep study? No  Do you snore loudly (loud enough to be heard through closed doors)?  1  Do you often feel tired, fatigued, or sleepy during the daytime (such as falling asleep during driving or talking to someone)? 0  Has anyone observed you stop breathing during your sleep? 0  Do you have, or are you being treated for high blood pressure? 1  BMI more than 35 kg/m2? 0  Age > 50 (1-yes) 1  Neck circumference greater than:Male 16 inches or larger, Male 17inches or larger? 1  Male Gender (Yes=1) 1  Obstructive Sleep Apnea Score 5  Score 5 or greater  Results sent to PCP

## 2024-02-05 NOTE — Anesthesia Preprocedure Evaluation (Addendum)
 Anesthesia Evaluation  Patient identified by MRN, date of birth, ID band Patient awake  General Assessment Comment:  Hx difficult airway in 2015 (records unavailable), but has since had GETA here in our system with documented uneventful direct laryngoscopy.  Reviewed: Allergy & Precautions, NPO status , Patient's Chart, lab work & pertinent test results  History of Anesthesia Complications (+) DIFFICULT AIRWAY and history of anesthetic complications  Airway Mallampati: III  TM Distance: >3 FB Neck ROM: Full    Dental no notable dental hx. (+) Teeth Intact   Pulmonary asthma , neg sleep apnea, neg COPD, Patient abstained from smoking.Not current smoker   Pulmonary exam normal breath sounds clear to auscultation       Cardiovascular Exercise Tolerance: Good METShypertension, Pt. on medications +CHF  (-) CAD and (-) Past MI + dysrhythmias Atrial Fibrillation  Rhythm:Regular Rate:Normal - Systolic murmurs 83 year old male follows with cardiology for history of HTN, HFpEF, atrial fibrillation on Eliquis  s/p multiple ablations and cardioversions (most recent cardioversion 11/2020).  Echo 09/2023 with LVEF 50 to 55%, grade 2 DD, normal RV, mild mitral regurgitation.  Nuclear stress 04/2019 showed normal perfusion.  Seen by Charles Beauvais, NP on 01/10/2024 for preop evaluation.  Per note, Chart reviewed as part of pre-operative protocol coverage. Given past medical history and time since last visit, based on ACC/AHA guidelines, Charles Lloyd. would be at acceptable risk for the planned procedure without further cardiovascular testing. His RCRI is high risk, greater then 11% risk of major cardiac event.  He is able to complete greater than 4 METS of physical activity. Patient was advised that if he develops new symptoms prior to surgery to contact our office to arrange a follow-up appointment.  He verbalized understanding. Per office protocol,  patient can hold Eliquis  for 3 days prior to procedure.   Neuro/Psych negative neurological ROS  negative psych ROS   GI/Hepatic ,neg GERD  ,,(+)     substance abuse  alcohol use  Endo/Other  diabetes, Oral Hypoglycemic Agents    Renal/GU CRFRenal disease     Musculoskeletal   Abdominal   Peds  Hematology  (+) Blood dyscrasia, anemia   Anesthesia Other Findings Past Medical History: 01/05/2019: Acute tracheobronchitis No date: B12 deficiency 01/28/2018: Basal cell carcinoma (BCC) of left side of nose No date: Cancer (HCC) 03/10/2018: Cardiomyopathy (HCC)     Comment:  With chronic atrial fibrillation 04/16/2013: CHF (congestive heart failure) (HCC)     Comment:  Functional class II, ejection fraction 35-40%                Formatting of this note might be different from the               original. Functional class II, ejection fraction 35-40% No date: Chronic anticoagulation 04/16/2013: Chronic diastolic (congestive) heart failure (HCC)     Comment:  Functional class II, ejection fraction 35-40%  Last               Assessment & Plan:  Clinically stable Volume well               controlled meds reviewed 04/16/2013: Chronic diastolic CHF (congestive heart failure) (HCC)     Comment:  Functional class II, ejection fraction 35-40%  Last               Assessment & Plan:  Clinically stable Volume well               controlled meds reviewed  04/16/2013: Colon polyps 04/05/2022: Coronary artery disease involving native coronary artery  of native heart without angina pectoris     Comment:  Myoview  05/25/2019: EF 60, normal perfusion, low               risk CCTA (AF protocol) 04/25/20: CAC score 4242 (99th               percentile) No date: Diabetic neuropathy (HCC) No date: Difficult intubation     Comment:  pt reports difficult intubation during surgery in               [place] in 2015 No date: Dysrhythmia     Comment:  A.Fib 02/17/2019: Eosinophil count  raised 01/06/2010: Essential hypertension     Comment:  Last Assessment & Plan:  Well controlled Continue med               management No date: Gout 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  Steroid usage was discontinued several               months ago.  Follow up with Dermatology as planned. 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     Comment:  Last Assessment & Plan:  Id following ?ongoing abx               managementy reported Request records No date: Infection of prosthetic right knee joint 03/04/2012: Insomnia     Comment:  Grief with loss of wife 02/20/12  Last Assessment &               Plan:  Improved sx  contineu xanax prn Se discussed 04/20/2019: Lesion of liver     Comment:  -On cardiac CT 02/2019 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 Transthoracic Echocardiogram-03/10/2013-[facility]: Normal left ventricular wall               thickness and cavity size.  Global left ventricular               systolic function is moderately reduced.  The estimated                ejection fraction is 35-40%.  The left atrium is  moderately enlarged.  The right atrium is mildly               enlarged.  No significant valvular  10/22/2016: Overweight (BMI 25.0-29.9) 04/16/2013: Persistent atrial fibrillation (HCC)     Comment:  Last Assessment & Plan:  Rate controlled Continue med               management eliquis  for stroke preventino  Formatting of               this note might be different from the original.  Drug  HX              Current Rx Pre-ABL inefficacy Pre-ABL intolerant Post-ABL              inefficacy Post-ABL intolerant max dose/24h M/Y end               comments  sotalol                  dofetilide                          flecainide                   propafenone                                am No date: S/P TKR (total knee replacement), bilateral 04/09/2019: Secondary hypercoagulable state 04/16/2013: Tinea cruris 04/16/2013: Type 2 diabetes mellitus with diabetic polyneuropathy,  without long-term current use of insulin  (HCC)     Comment:  Last Assessment & Plan:  Labs today Pt reports well               controlled on ambulatory monitoring 07/25/2018: Vitamin D  deficiency  Reproductive/Obstetrics                              Anesthesia Physical Anesthesia Plan  ASA: 3  Anesthesia Plan: General   Post-op Pain Management: Tylenol  PO (pre-op)*   Induction: Intravenous  PONV Risk Score and Plan: 3 and Ondansetron , Dexamethasone , Treatment may vary due to age or medical condition, TIVA and Propofol  infusion  Airway Management Planned: Oral ETT  Additional Equipment: None  Intra-op Plan:   Post-operative Plan: Extubation in OR  Informed Consent: I have reviewed the patients History and Physical, chart, labs and discussed the procedure including the risks, benefits and alternatives for the proposed anesthesia with the patient or authorized representative who has indicated his/her understanding and  acceptance.     Dental advisory given  Plan Discussed with: CRNA and Surgeon  Anesthesia Plan Comments: (Discussed risks of anesthesia with patient, including PONV, sore throat, lip/dental/eye damage, post operative cognitive dysfunction. Rare risks discussed as well, such as cardiorespiratory and neurological sequelae, and allergic reactions. Discussed the role of CRNA in patient's perioperative care. Patient understands.)         Anesthesia Quick Evaluation

## 2024-02-05 NOTE — Progress Notes (Signed)
 Anesthesia Chart Review:  83 year old male follows with cardiology for history of HTN, HFpEF, atrial fibrillation on Eliquis  s/p multiple ablations and cardioversions (most recent cardioversion 11/2020).  Echo 09/2023 with LVEF 50 to 55%, grade 2 DD, normal RV, mild mitral regurgitation.  Nuclear stress 04/2019 showed normal perfusion.  Seen by Charles Beauvais, NP on 01/10/2024 for preop evaluation.  Per note, Chart reviewed as part of pre-operative protocol coverage. Given past medical history and time since last visit, based on ACC/AHA guidelines, Charles Lloyd. would be at acceptable risk for the planned procedure without further cardiovascular testing. His RCRI is high risk, greater then 11% risk of major cardiac event.  He is able to complete greater than 4 METS of physical activity. Patient was advised that if he develops new symptoms prior to surgery to contact our office to arrange a follow-up appointment.  He verbalized understanding. Per office protocol, patient can hold Eliquis  for 3 days prior to procedure.  Other pertinent history includes prostate cancer s/p prostatectomy, diet-controlled DM2.  Patient seen by PCP Dr. Garnette Lloyd on 01/22/2024 for abrasion on right lower leg.  He was treated with Keflex  for cellulitis.  Patient reports significant improvement.  Patient reports he was told he was a difficult intubation following prostatectomy done in [place] in 2015.  He denies any perioperative complications and cannot provide any further details.  Patient had atrial fibrillation ablations at [facility] on 03/12/2019 and 04/29/2020 with no intubation complications noted.  Mask ventilation without difficulty.  No diagnosis of OSA, however, STOP-BANG score of 5 indicates increased risk.  Preop labs reviewed, creatinine mildly elevated 1.39, mild anemia hemoglobin 10.7, otherwise unremarkable.  DM2 well-controlled with A1c 5.9.  EKG 06/04/2023: Sinus rhythm with first-degree AV block.  Rate 84.   Nonspecific intraventricular conduction block.  TTE 10/03/2023:  1. Left ventricular ejection fraction, by estimation, is 50 to 55%. Left  ventricular ejection fraction by 3D volume is 54 %. The left ventricle has  low normal function. The left ventricle has no regional wall motion  abnormalities. Left ventricular  diastolic parameters are consistent with Grade II diastolic dysfunction  (pseudonormalization). The average left ventricular global longitudinal  strain is -16.6 %. The global longitudinal strain is abnormal.   2. Right ventricular systolic function is normal. The right ventricular  size is normal.   3. Left atrial size was severely dilated.   4. The mitral valve is normal in structure. Mild mitral valve  regurgitation. No evidence of mitral stenosis.   5. The aortic valve is normal in structure. Aortic valve regurgitation is  not visualized. Aortic valve sclerosis/calcification is present, without  any evidence of aortic stenosis.   6. The inferior vena cava is normal in size with greater than 50%  respiratory variability, suggesting right atrial pressure of 3 mmHg.   Nuclear stress 05/25/2019: Nuclear stress EF: 60%. No T wave inversion was noted during stress. There was no ST segment deviation noted during stress. This is a low risk study.   Normal perfusion. LVEF 60% with normal wall motion. This is a low risk study. No prior for comparison.      Charles Lloyd [facility] Short Stay Center/Anesthesiology Phone [phone] 02/05/2024 1:31 PM

## 2024-02-05 NOTE — Progress Notes (Signed)
 PCP - Theophilus Andrews, Tully GRADE, MD  Cardiologist -     Bernie Lamar PARAS, MD   EP-Camnitz, Soyla Lunger, MD   PPM/ICD - denies   Chest x-ray - N/A EKG - 06/04/23 Stress Test - 05/25/19 ECHO - 10/03/23 Cardiac Cath - denies- pt has had ablations  Sleep Study - denies   Fasting Blood Sugar - Pt does not check CBG at home. A1c checked today. Pt reports his DM is controlled and he is not on any medications for this. CBG 105 at PAT  Last dose of GLP1 agonist-   N/A   Blood Thinner Instructions: Eliquis - LD 02/02/24 Aspirin Instructions: N/A  ERAS Protcol - NPO order  COVID TEST-  N/A   Anesthesia review: yes- Anesthesia APP notified of pt while at PAT appointment. Pt reports that he was told that he was a difficult intubation back in 2015 in [place]. Pt reports no other issues with anesthesia other than losing taste and smell for a year after prostatectomy. Pt with cardiac history- clearance note from 01/10/24. Pt denies any cardiac symptoms.   Patient denies shortness of breath, fever, cough and chest pain at PAT appointment   All instructions explained to the patient, with a verbal understanding of the material. Patient agrees to go over the instructions while at home for a better understanding.  The opportunity to ask questions was provided.

## 2024-02-06 ENCOUNTER — Inpatient Hospital Stay (HOSPITAL_COMMUNITY): Admitting: Anesthesiology

## 2024-02-06 ENCOUNTER — Encounter (HOSPITAL_COMMUNITY): Admission: RE | Disposition: A | Discharge status: Home / Self Care | Payer: Self-pay | Source: Home / Self Care

## 2024-02-06 ENCOUNTER — Observation Stay (HOSPITAL_COMMUNITY)

## 2024-02-06 ENCOUNTER — Inpatient Hospital Stay (HOSPITAL_COMMUNITY)

## 2024-02-06 ENCOUNTER — Encounter (HOSPITAL_COMMUNITY): Payer: Self-pay

## 2024-02-06 ENCOUNTER — Inpatient Hospital Stay (HOSPITAL_COMMUNITY)
Admission: RE | Admit: 2024-02-06 | Discharge: 2024-02-07 | DRG: 402 | Disposition: A | Discharge status: Home / Self Care

## 2024-02-06 ENCOUNTER — Inpatient Hospital Stay (HOSPITAL_COMMUNITY): Admitting: Physician Assistant

## 2024-02-06 DIAGNOSIS — Z79899 Other long term (current) drug therapy: Secondary | ICD-10-CM

## 2024-02-06 DIAGNOSIS — E1169 Type 2 diabetes mellitus with other specified complication: Secondary | ICD-10-CM | POA: Diagnosis present

## 2024-02-06 DIAGNOSIS — I480 Paroxysmal atrial fibrillation: Secondary | ICD-10-CM | POA: Diagnosis present

## 2024-02-06 DIAGNOSIS — J45909 Unspecified asthma, uncomplicated: Secondary | ICD-10-CM | POA: Diagnosis present

## 2024-02-06 DIAGNOSIS — I2510 Atherosclerotic heart disease of native coronary artery without angina pectoris: Secondary | ICD-10-CM | POA: Diagnosis present

## 2024-02-06 DIAGNOSIS — I130 Hypertensive heart and chronic kidney disease with heart failure and stage 1 through stage 4 chronic kidney disease, or unspecified chronic kidney disease: Secondary | ICD-10-CM | POA: Diagnosis present

## 2024-02-06 DIAGNOSIS — E119 Type 2 diabetes mellitus without complications: Secondary | ICD-10-CM

## 2024-02-06 DIAGNOSIS — E7800 Pure hypercholesterolemia, unspecified: Secondary | ICD-10-CM | POA: Diagnosis present

## 2024-02-06 DIAGNOSIS — R296 Repeated falls: Secondary | ICD-10-CM | POA: Diagnosis present

## 2024-02-06 DIAGNOSIS — E7849 Other hyperlipidemia: Secondary | ICD-10-CM | POA: Diagnosis present

## 2024-02-06 DIAGNOSIS — E1142 Type 2 diabetes mellitus with diabetic polyneuropathy: Secondary | ICD-10-CM | POA: Diagnosis present

## 2024-02-06 DIAGNOSIS — Z860100 Personal history of colon polyps, unspecified: Secondary | ICD-10-CM

## 2024-02-06 DIAGNOSIS — Z85828 Personal history of other malignant neoplasm of skin: Secondary | ICD-10-CM

## 2024-02-06 DIAGNOSIS — M4316 Spondylolisthesis, lumbar region: Principal | ICD-10-CM | POA: Diagnosis present

## 2024-02-06 DIAGNOSIS — M109 Gout, unspecified: Secondary | ICD-10-CM | POA: Diagnosis present

## 2024-02-06 DIAGNOSIS — Z888 Allergy status to other drugs, medicaments and biological substances status: Secondary | ICD-10-CM

## 2024-02-06 DIAGNOSIS — Z419 Encounter for procedure for purposes other than remedying health state, unspecified: Secondary | ICD-10-CM

## 2024-02-06 DIAGNOSIS — Z96653 Presence of artificial knee joint, bilateral: Secondary | ICD-10-CM | POA: Diagnosis present

## 2024-02-06 DIAGNOSIS — Z7901 Long term (current) use of anticoagulants: Secondary | ICD-10-CM

## 2024-02-06 DIAGNOSIS — Z818 Family history of other mental and behavioral disorders: Secondary | ICD-10-CM

## 2024-02-06 DIAGNOSIS — I5032 Chronic diastolic (congestive) heart failure: Secondary | ICD-10-CM | POA: Diagnosis present

## 2024-02-06 DIAGNOSIS — I429 Cardiomyopathy, unspecified: Secondary | ICD-10-CM | POA: Diagnosis present

## 2024-02-06 DIAGNOSIS — N1832 Chronic kidney disease, stage 3b: Secondary | ICD-10-CM | POA: Diagnosis present

## 2024-02-06 DIAGNOSIS — E1122 Type 2 diabetes mellitus with diabetic chronic kidney disease: Secondary | ICD-10-CM | POA: Diagnosis present

## 2024-02-06 DIAGNOSIS — Z923 Personal history of irradiation: Secondary | ICD-10-CM

## 2024-02-06 HISTORY — PX: ANTERIOR LAT LUMBAR FUSION: SHX1168

## 2024-02-06 LAB — GLUCOSE, CAPILLARY
Glucose-Capillary: 109 mg/dL — ABNORMAL HIGH (ref 70–99)
Glucose-Capillary: 118 mg/dL — ABNORMAL HIGH (ref 70–99)
Glucose-Capillary: 202 mg/dL — ABNORMAL HIGH (ref 70–99)
Glucose-Capillary: 241 mg/dL — ABNORMAL HIGH (ref 70–99)

## 2024-02-06 LAB — ABO/RH: ABO/RH(D): O POS

## 2024-02-06 SURGERY — ANTERIOR LATERAL LUMBAR FUSION 1 LEVEL
Anesthesia: General

## 2024-02-06 MED ORDER — HYDROMORPHONE HCL 1 MG/ML IJ SOLN
INTRAMUSCULAR | Status: AC
  Filled 2024-02-06: qty 0.5

## 2024-02-06 MED ORDER — INSULIN ASPART 100 UNIT/ML IJ SOLN
0.0000 [IU] | Freq: Three times a day (TID) | INTRAMUSCULAR | Status: DC
  Administered 2024-02-07: 3 [IU] via SUBCUTANEOUS
  Filled 2024-02-06: qty 3

## 2024-02-06 MED ORDER — LACTATED RINGERS IV SOLN: INTRAVENOUS | Status: DC

## 2024-02-06 MED ORDER — FENTANYL CITRATE (PF) 100 MCG/2ML IJ SOLN
INTRAMUSCULAR | Status: AC
  Filled 2024-02-06: qty 2

## 2024-02-06 MED ORDER — SODIUM CHLORIDE 0.9% FLUSH: 3.0000 mL | INTRAVENOUS | Status: DC | PRN

## 2024-02-06 MED ORDER — OXYCODONE HCL 5 MG PO TABS
5.0000 mg | ORAL_TABLET | ORAL | Status: DC | PRN
  Administered 2024-02-06 – 2024-02-07 (×4): 5 mg via ORAL
  Filled 2024-02-06 (×4): qty 1

## 2024-02-06 MED ORDER — HYDROMORPHONE HCL 1 MG/ML IJ SOLN: 0.5000 mg | INTRAMUSCULAR | Status: DC | PRN

## 2024-02-06 MED ORDER — 0.9 % SODIUM CHLORIDE (POUR BTL) OPTIME
TOPICAL | Status: DC | PRN
  Administered 2024-02-06 (×2): 1000 mL

## 2024-02-06 MED ORDER — SODIUM CHLORIDE 0.9% FLUSH
3.0000 mL | Freq: Two times a day (BID) | INTRAVENOUS | Status: DC
  Administered 2024-02-06 (×2): 3 mL via INTRAVENOUS

## 2024-02-06 MED ORDER — CEFAZOLIN SODIUM-DEXTROSE 2-4 GM/100ML-% IV SOLN
2.0000 g | Freq: Four times a day (QID) | INTRAVENOUS | Status: AC
  Administered 2024-02-06 (×2): 2 g via INTRAVENOUS
  Filled 2024-02-06: qty 100

## 2024-02-06 MED ORDER — EPHEDRINE SULFATE-NACL 50-0.9 MG/10ML-% IV SOSY
PREFILLED_SYRINGE | INTRAVENOUS | Status: DC | PRN
  Administered 2024-02-06: 5 mg via INTRAVENOUS
  Administered 2024-02-06: 10 mg via INTRAVENOUS
  Administered 2024-02-06: 15 mg via INTRAVENOUS
  Administered 2024-02-06: 5 mg via INTRAVENOUS

## 2024-02-06 MED ORDER — ALBUMIN HUMAN 5 % IV SOLN: INTRAVENOUS | Status: DC | PRN

## 2024-02-06 MED ORDER — SODIUM CHLORIDE 0.9 % IV SOLN
0.1500 ug/kg/min | INTRAVENOUS | Status: AC
  Administered 2024-02-06: .1 ug/kg/min via INTRAVENOUS
  Filled 2024-02-06: qty 2000

## 2024-02-06 MED ORDER — ALLOPURINOL 300 MG PO TABS
300.0000 mg | ORAL_TABLET | Freq: Every day | ORAL | Status: DC
  Administered 2024-02-07: 300 mg via ORAL
  Filled 2024-02-06: qty 1

## 2024-02-06 MED ORDER — FLECAINIDE ACETATE 100 MG PO TABS
100.0000 mg | ORAL_TABLET | Freq: Two times a day (BID) | ORAL | Status: DC
  Administered 2024-02-06 – 2024-02-07 (×2): 100 mg via ORAL
  Filled 2024-02-06 (×4): qty 1

## 2024-02-06 MED ORDER — PHENOL 1.4 % MT LIQD: 1.0000 | OROMUCOSAL | Status: DC | PRN

## 2024-02-06 MED ORDER — INSULIN ASPART 100 UNIT/ML IJ SOLN
0.0000 [IU] | Freq: Every day | INTRAMUSCULAR | Status: DC
  Administered 2024-02-06: 2 [IU] via SUBCUTANEOUS
  Filled 2024-02-06: qty 2

## 2024-02-06 MED ORDER — ONDANSETRON HCL 4 MG/2ML IJ SOLN
INTRAMUSCULAR | Status: DC | PRN
  Administered 2024-02-06: 4 mg via INTRAVENOUS

## 2024-02-06 MED ORDER — MAGNESIUM CITRATE PO SOLN: 1.0000 | Freq: Once | ORAL | Status: DC | PRN

## 2024-02-06 MED ORDER — CARVEDILOL 3.125 MG PO TABS
3.1250 mg | ORAL_TABLET | Freq: Two times a day (BID) | ORAL | Status: DC
  Administered 2024-02-06 – 2024-02-07 (×2): 3.125 mg via ORAL
  Filled 2024-02-06 (×2): qty 1

## 2024-02-06 MED ORDER — PHENYLEPHRINE 80 MCG/ML (10ML) SYRINGE FOR IV PUSH (FOR BLOOD PRESSURE SUPPORT)
PREFILLED_SYRINGE | INTRAVENOUS | Status: DC | PRN
  Administered 2024-02-06 (×3): 160 ug via INTRAVENOUS
  Administered 2024-02-06 (×2): 80 ug via INTRAVENOUS

## 2024-02-06 MED ORDER — THROMBIN 5000 UNITS EX SOLR
OROMUCOSAL | Status: DC | PRN
  Administered 2024-02-06: 5 mL via TOPICAL

## 2024-02-06 MED ORDER — ACETAMINOPHEN 500 MG PO TABS
1000.0000 mg | ORAL_TABLET | Freq: Once | ORAL | Status: AC
  Administered 2024-02-06: 1000 mg via ORAL
  Filled 2024-02-06: qty 2

## 2024-02-06 MED ORDER — OXYCODONE HCL 5 MG/5ML PO SOLN: 5.0000 mg | Freq: Once | ORAL | Status: DC | PRN

## 2024-02-06 MED ORDER — GABAPENTIN 300 MG PO CAPS
300.0000 mg | ORAL_CAPSULE | Freq: Two times a day (BID) | ORAL | Status: DC
  Administered 2024-02-06 – 2024-02-07 (×2): 300 mg via ORAL
  Filled 2024-02-06 (×2): qty 1

## 2024-02-06 MED ORDER — HYDROMORPHONE HCL 1 MG/ML IJ SOLN
INTRAMUSCULAR | Status: DC | PRN
  Administered 2024-02-06 (×2): .25 mg via INTRAVENOUS

## 2024-02-06 MED ORDER — THROMBIN 5000 UNITS EX KIT
PACK | CUTANEOUS | Status: AC
  Filled 2024-02-06: qty 1

## 2024-02-06 MED ORDER — GABAPENTIN 300 MG PO CAPS
600.0000 mg | ORAL_CAPSULE | Freq: Every day | ORAL | Status: DC
  Administered 2024-02-06: 600 mg via ORAL
  Filled 2024-02-06: qty 2

## 2024-02-06 MED ORDER — DEXAMETHASONE SOD PHOSPHATE PF 10 MG/ML IJ SOLN
INTRAMUSCULAR | Status: DC | PRN
  Administered 2024-02-06: 10 mg via INTRAVENOUS

## 2024-02-06 MED ORDER — CYCLOBENZAPRINE HCL 10 MG PO TABS
10.0000 mg | ORAL_TABLET | Freq: Three times a day (TID) | ORAL | Status: DC | PRN
  Administered 2024-02-06: 10 mg via ORAL
  Filled 2024-02-06: qty 1

## 2024-02-06 MED ORDER — FLUTICASONE PROPIONATE 50 MCG/ACT NA SUSP
2.0000 | Freq: Every day | NASAL | Status: DC
  Filled 2024-02-06: qty 16

## 2024-02-06 MED ORDER — CEFAZOLIN SODIUM-DEXTROSE 2-4 GM/100ML-% IV SOLN
2.0000 g | INTRAVENOUS | Status: AC
  Administered 2024-02-06: 2 g via INTRAVENOUS
  Filled 2024-02-06: qty 100

## 2024-02-06 MED ORDER — LIDOCAINE 2% (20 MG/ML) 5 ML SYRINGE
INTRAMUSCULAR | Status: DC | PRN
  Administered 2024-02-06: 100 mg via INTRAVENOUS

## 2024-02-06 MED ORDER — GABAPENTIN 300 MG PO CAPS: 300.0000 mg | ORAL_CAPSULE | Freq: Three times a day (TID) | ORAL | Status: DC

## 2024-02-06 MED ORDER — BUPIVACAINE-EPINEPHRINE 0.5% -1:200000 IJ SOLN
INTRAMUSCULAR | Status: DC | PRN
  Administered 2024-02-06: 10 mL
  Administered 2024-02-06: 20 mL

## 2024-02-06 MED ORDER — PROPOFOL 10 MG/ML IV BOLUS
INTRAVENOUS | Status: AC
  Filled 2024-02-06: qty 20

## 2024-02-06 MED ORDER — ACETAMINOPHEN 325 MG PO TABS: 650.0000 mg | ORAL_TABLET | ORAL | Status: DC | PRN

## 2024-02-06 MED ORDER — POLYETHYLENE GLYCOL 3350 17 G PO PACK
17.0000 g | PACK | Freq: Every day | ORAL | Status: DC
  Administered 2024-02-07: 17 g via ORAL

## 2024-02-06 MED ORDER — BISACODYL 5 MG PO TBEC: 5.0000 mg | DELAYED_RELEASE_TABLET | Freq: Every day | ORAL | Status: DC | PRN

## 2024-02-06 MED ORDER — FENTANYL CITRATE (PF) 100 MCG/2ML IJ SOLN
INTRAMUSCULAR | Status: DC | PRN
  Administered 2024-02-06: 100 ug via INTRAVENOUS

## 2024-02-06 MED ORDER — TORSEMIDE 20 MG PO TABS
20.0000 mg | ORAL_TABLET | Freq: Two times a day (BID) | ORAL | Status: DC
  Administered 2024-02-06 – 2024-02-07 (×3): 20 mg via ORAL
  Filled 2024-02-06 (×3): qty 1

## 2024-02-06 MED ORDER — SODIUM CHLORIDE 0.9 % IV SOLN: 250.0000 mL | INTRAVENOUS | Status: AC

## 2024-02-06 MED ORDER — BUPIVACAINE-EPINEPHRINE (PF) 0.5% -1:200000 IJ SOLN
INTRAMUSCULAR | Status: AC
  Filled 2024-02-06: qty 30

## 2024-02-06 MED ORDER — MONTELUKAST SODIUM 10 MG PO TABS
10.0000 mg | ORAL_TABLET | Freq: Every morning | ORAL | Status: DC
  Administered 2024-02-07: 10 mg via ORAL
  Filled 2024-02-06: qty 1

## 2024-02-06 MED ORDER — ORAL CARE MOUTH RINSE: 15.0000 mL | Freq: Once | OROMUCOSAL | Status: AC

## 2024-02-06 MED ORDER — PHENYLEPHRINE HCL-NACL 20-0.9 MG/250ML-% IV SOLN
INTRAVENOUS | Status: DC | PRN
  Administered 2024-02-06: 50 ug/min via INTRAVENOUS

## 2024-02-06 MED ORDER — PROPOFOL 10 MG/ML IV BOLUS
INTRAVENOUS | Status: DC | PRN
  Administered 2024-02-06: 120 mg via INTRAVENOUS

## 2024-02-06 MED ORDER — CHLORHEXIDINE GLUCONATE CLOTH 2 % EX PADS
6.0000 | MEDICATED_PAD | Freq: Once | CUTANEOUS | Status: AC
  Administered 2024-02-06: 6 via TOPICAL

## 2024-02-06 MED ORDER — DROPERIDOL 2.5 MG/ML IJ SOLN: 0.6250 mg | Freq: Once | INTRAMUSCULAR | Status: DC | PRN

## 2024-02-06 MED ORDER — CHLORHEXIDINE GLUCONATE CLOTH 2 % EX PADS: 6.0000 | MEDICATED_PAD | Freq: Once | CUTANEOUS | Status: DC

## 2024-02-06 MED ORDER — OXYCODONE HCL 5 MG PO TABS: 5.0000 mg | ORAL_TABLET | Freq: Once | ORAL | Status: DC | PRN

## 2024-02-06 MED ORDER — HYDRALAZINE HCL 25 MG PO TABS
25.0000 mg | ORAL_TABLET | Freq: Two times a day (BID) | ORAL | Status: DC
  Administered 2024-02-06 – 2024-02-07 (×2): 25 mg via ORAL
  Filled 2024-02-06 (×2): qty 1

## 2024-02-06 MED ORDER — CHLORHEXIDINE GLUCONATE 0.12 % MT SOLN
15.0000 mL | Freq: Once | OROMUCOSAL | Status: AC
  Administered 2024-02-06: 15 mL via OROMUCOSAL
  Filled 2024-02-06: qty 15

## 2024-02-06 MED ORDER — ONDANSETRON HCL 4 MG PO TABS: 4.0000 mg | ORAL_TABLET | Freq: Four times a day (QID) | ORAL | Status: DC | PRN

## 2024-02-06 MED ORDER — MENTHOL 3 MG MT LOZG: 1.0000 | LOZENGE | OROMUCOSAL | Status: DC | PRN

## 2024-02-06 MED ORDER — LACTATED RINGERS IV SOLN: INTRAVENOUS | Status: DC | PRN

## 2024-02-06 MED ORDER — ONDANSETRON HCL 4 MG/2ML IJ SOLN: 4.0000 mg | Freq: Four times a day (QID) | INTRAMUSCULAR | Status: DC | PRN

## 2024-02-06 MED ORDER — ACETAMINOPHEN 650 MG RE SUPP: 650.0000 mg | RECTAL | Status: DC | PRN

## 2024-02-06 MED ORDER — SUCCINYLCHOLINE 20MG/ML (10ML) SYRINGE FOR MEDFUSION PUMP - OPTIME
INTRAMUSCULAR | Status: DC | PRN
  Administered 2024-02-06: 100 mg via INTRAVENOUS

## 2024-02-06 MED ORDER — FENTANYL CITRATE (PF) 100 MCG/2ML IJ SOLN: 25.0000 ug | INTRAMUSCULAR | Status: DC | PRN

## 2024-02-06 MED ORDER — SODIUM CHLORIDE 0.9 % IV SOLN: INTRAVENOUS | Status: AC

## 2024-02-06 MED ORDER — ENOXAPARIN SODIUM 40 MG/0.4ML IJ SOSY: 40.0000 mg | PREFILLED_SYRINGE | INTRAMUSCULAR | Status: DC

## 2024-02-06 MED ORDER — DOCUSATE SODIUM 100 MG PO CAPS
100.0000 mg | ORAL_CAPSULE | Freq: Two times a day (BID) | ORAL | Status: DC
  Administered 2024-02-06 (×2): 100 mg via ORAL
  Filled 2024-02-06 (×3): qty 1

## 2024-02-06 MED ADMIN — Potassium Chloride Microencapsulated Crys ER Tab 20 mEq: 20 meq | ORAL | NDC 00245531989

## 2024-02-06 MED FILL — Potassium Chloride Microencapsulated Crys ER Tab 20 mEq: 20.0000 meq | ORAL | Qty: 1 | Status: AC

## 2024-02-06 SURGICAL SUPPLY — 75 items
ALLOGRAFT ARTHROCELL PLUS 1 (Bone Implant) IMPLANT
ALLOGRAFT ARTHROCELL PLUS 2.5 (Bone Implant) IMPLANT
ALLOGRAFT ARTHROCELL PLUS 5 (Bone Implant) IMPLANT
BAG COUNTER SPONGE SURGICOUNT (BAG) ×1 IMPLANT
BLADE CLIPPER SURG (BLADE) IMPLANT
BLADE ILLUMINATOR MIS (MISCELLANEOUS) IMPLANT
BUR 14 MATCH 3 (BUR) IMPLANT
BUR MR8 14 BALL 5 (BUR) IMPLANT
BUR PRECISION FLUTE 5.0 (BURR) IMPLANT
BUR SURG IBUR 4X12.5 (BURR) ×1 IMPLANT
CAP LOCKING SPINE (Cap) IMPLANT
COVER BACK TABLE 60X90IN (DRAPES) IMPLANT
DERMABOND ADVANCED .7 DNX12 (GAUZE/BANDAGES/DRESSINGS) ×1 IMPLANT
DERMABOND ADVANCED .7 DNX6 (GAUZE/BANDAGES/DRESSINGS) IMPLANT
DISSECTOR BLUNT TIP ENDO 5MM (MISCELLANEOUS) IMPLANT
DISSECTOR STICK (MISCELLANEOUS) IMPLANT
DRAPE C-ARM 42X72 X-RAY (DRAPES) ×1 IMPLANT
DRAPE C-ARMOR (DRAPES) ×1 IMPLANT
DRAPE SHEET LG 3/4 BI-LAMINATE (DRAPES) ×4 IMPLANT
DRAPE SURG ORHT 6 SPLT 77X108 (DRAPES) IMPLANT
DRAPE UTILITY XL STRL (DRAPES) ×1 IMPLANT
DRSG OPSITE POSTOP 4X6 (GAUZE/BANDAGES/DRESSINGS) ×3 IMPLANT
DURAPREP 26ML APPLICATOR (WOUND CARE) ×4 IMPLANT
ELECT COATED BLADE 2.86 ST (ELECTRODE) ×1 IMPLANT
ELECT NVM5 SURFACE MEP/EMG (ELECTRODE) IMPLANT
ELECTRODE BLADE INSULATED 4IN (ELECTROSURGICAL) ×1 IMPLANT
ELECTRODE BLDE INSULATED 6.5IN (ELECTROSURGICAL) ×1 IMPLANT
ELECTRODE REM PT RTRN 9FT ADLT (ELECTROSURGICAL) ×1 IMPLANT
FEE COVERAGE SUPPORT O-ARM (MISCELLANEOUS) ×1 IMPLANT
FEE NCS SETUP TEAR DOWN NVM5 (MISCELLANEOUS) IMPLANT
GAUZE 4X4 16PLY ~~LOC~~+RFID DBL (SPONGE) IMPLANT
GLOVE BIO SURGEON STRL SZ7 (GLOVE) ×2 IMPLANT
GLOVE BIOGEL PI IND STRL 8 (GLOVE) ×2 IMPLANT
GLOVE INDICATOR 8.5 STRL (GLOVE) ×1 IMPLANT
GLOVE SURG SYN 8.0 PF PI (GLOVE) ×2 IMPLANT
GOWN STRL REUS W/ TWL LRG LVL3 (GOWN DISPOSABLE) IMPLANT
GOWN STRL REUS W/ TWL XL LVL3 (GOWN DISPOSABLE) ×3 IMPLANT
GOWN STRL REUS W/TWL 2XL LVL3 (GOWN DISPOSABLE) IMPLANT
HEMOSTAT POWDER KIT SURGIFOAM (HEMOSTASIS) ×1 IMPLANT
KIT BASIN OR (CUSTOM PROCEDURE TRAY) ×1 IMPLANT
KIT DILATOR SURG STIM F XLIF (KITS) IMPLANT
KIT DILATOR XLIF 5 (KITS) IMPLANT
KIT PAD OR POSITION F/XLIF PRO (KITS) IMPLANT
KIT POSITIONER JACKSON TABLE (MISCELLANEOUS) IMPLANT
KIT SURGICAL ACCESS MAXCESS 4 (KITS) IMPLANT
KIT TURNOVER KIT B (KITS) ×1 IMPLANT
MARKER SPHERE PSV REFLC NDI (MISCELLANEOUS) ×5 IMPLANT
MODULE EMG NDL SSEP NVM5 (NEUROSURGERY SUPPLIES) IMPLANT
MODULE EMG NEEDLE SSEP NVM5 (NEUROSURGERY SUPPLIES) IMPLANT
NDL HYPO 22X1.5 SAFETY MO (MISCELLANEOUS) ×1 IMPLANT
NEEDLE HYPO 22X1.5 SAFETY MO (MISCELLANEOUS) ×1 IMPLANT
PACK LAMINECTOMY NEURO (CUSTOM PROCEDURE TRAY) ×1 IMPLANT
PACK UNIVERSAL I (CUSTOM PROCEDURE TRAY) ×1 IMPLANT
PIN BONE FIX 100 (PIN) IMPLANT
ROD CREO MOD 6.5X50 (Rod) IMPLANT
ROD SPINE CURVE 5.5X50 (Rod) IMPLANT
SCREW CREO MIS 30 TULIP (Screw) IMPLANT
SCREW SD MOD CREO 6.5X55 (Screw) IMPLANT
SHIM DISC ALUMINUM (MISCELLANEOUS) IMPLANT
SOLN 0.9% NACL POUR BTL 1000ML (IV SOLUTION) ×1 IMPLANT
SOLN STERILE WATER BTL 1000 ML (IV SOLUTION) ×1 IMPLANT
SPACER RISE-L 18X50 8-15 6D (Spacer) IMPLANT
SPIKE FLUID TRANSFER (MISCELLANEOUS) ×1 IMPLANT
SPONGE NEURO XRAY DETECT 1X3 (DISPOSABLE) IMPLANT
SPONGE T-LAP 4X18 ~~LOC~~+RFID (SPONGE) IMPLANT
SPONGE TONSIL 1 RF SGL (DISPOSABLE) IMPLANT
STAPLER SKIN PROX 35W (STAPLE) ×1 IMPLANT
SUT MNCRL AB 4-0 PS2 18 (SUTURE) IMPLANT
SUT VIC AB 0 CT1 18XCR BRD8 (SUTURE) ×1 IMPLANT
SUT VIC AB 2-0 CT1 18 (SUTURE) ×1 IMPLANT
SUT VIC AB 3-0 SH 8-18 (SUTURE) ×1 IMPLANT
SYR 20ML LL LF (SYRINGE) ×1 IMPLANT
TOWEL GREEN STERILE (TOWEL DISPOSABLE) ×1 IMPLANT
TOWEL GREEN STERILE FF (TOWEL DISPOSABLE) ×1 IMPLANT
TRAY FOLEY MTR SLVR 16FR STAT (SET/KITS/TRAYS/PACK) ×1 IMPLANT

## 2024-02-06 NOTE — Discharge Instructions (Signed)
 Wound Care No dressing is required over your incision. Keep clean and dry. You can shower 24 hours after surgery. Let water run over incision. Do not scrub. Pat dry Do not put any creams, lotions, or ointments on incision. Do not submerge your incision for the first 6 weeks (pool, ocean, etc)  Activity Walk each and every day, increasing distance each day. No lifting greater than 10 lbs.  Avoid excessive back/neck motion.  Diet Resume your normal diet.  You may restart your home Eliquis  on 12/18  Call Your Doctor If Any of These Occur Redness, drainage, or swelling at the wound.  Temperature greater than 101 degrees. Severe pain not relieved by pain medication. Incision starts to come apart.  Follow Up Appt Call [phone] today for appointment in 6 weeks if you don't already have one or for any problems.  If you have any hardware placed in your spine, you will need an x-ray before your appointment.

## 2024-02-06 NOTE — Anesthesia Postprocedure Evaluation (Signed)
 Anesthesia Post Note  Patient: Charles Lloyd.  Procedure(s) Performed: ANTERIOR PRONE LATERAL LUMBAR FUSION LUMBAR FOUR-LUMBAR FIVE APPLICATION OF O-ARM     Patient location during evaluation: PACU Anesthesia Type: General Level of consciousness: awake and alert Pain management: pain level controlled Vital Signs Assessment: post-procedure vital signs reviewed and stable Respiratory status: spontaneous breathing, nonlabored ventilation, respiratory function stable and patient connected to nasal cannula oxygen  Cardiovascular status: blood pressure returned to baseline and stable Postop Assessment: no apparent nausea or vomiting Anesthetic complications: no   There were no known notable events for this encounter.  Last Vitals:  Vitals:   02/06/24 1215 02/06/24 1234  BP: (!) 142/71 136/68  Pulse: 62 63  Resp: (!) 7 18  Temp: 36.5 C 36.5 C  SpO2: 92% 91%    Last Pain:  Vitals:   02/06/24 1401  TempSrc:   PainSc: 5                  [name]

## 2024-02-06 NOTE — Anesthesia Procedure Notes (Signed)
 Procedure Name: Intubation Date/Time: 02/06/2024 7:45 AM  Performed by: Theophilus Burnet, Aloysius Pour, CRNAPre-anesthesia Checklist: Patient identified, Emergency Drugs available, Suction available and Patient being monitored Patient Re-evaluated:Patient Re-evaluated prior to induction Oxygen  Delivery Method: Circle system utilized Preoxygenation: Pre-oxygenation with 100% oxygen  Induction Type: IV induction Ventilation: Mask ventilation without difficulty Laryngoscope Size: Glidescope and 4 Grade View: Grade II Tube type: Oral Tube size: 7.5 mm Number of attempts: 1 Airway Equipment and Method: Stylet and Video-laryngoscopy Placement Confirmation: ETT inserted through vocal cords under direct vision, positive ETCO2 and breath sounds checked- equal and bilateral Secured at: 23 cm Tube secured with: Tape Dental Injury: Teeth and Oropharynx as per pre-operative assessment

## 2024-02-06 NOTE — Op Note (Signed)
 PREOP DIAGNOSIS:  L4-5 spondylolisthesis   POSTOP DIAGNOSIS: Same  PROCEDURE: L4-5, left side approach, prone transpsoas lumbar interbody fusion L4-5 posterolateral fusion  SURGEON: Dorn Glade, MD  ASSISTANT: none  ANESTHESIA: General Endotracheal  EBL: 100cc  SPECIMENS: none  DRAINS: none  COMPLICATIONS: none  CONDITION: none  NEUROMONITORING: no issues when docking retractor  HISTORY: Charles Lloyd. is a 83 y.o. male with history of A-fib on Eliquis  who presented with low back pain and left lower extremity radiculopathy refractory to physical therapy and injections.  Imaging showed multilevel spondylosis worse at L4-5, transitional anatomy, with spondylolisthesis and severe stenosis.  I offered a prone lateral body fusion for indirect decompression and stabilization. I explained the risks of bleeding, infection, nerve root injury, lumbar plexus injury, CSF leak, hardware failure, pseudoarthrosis, damage to nearby organs, general seizure risk, mortality.  They verbalized understanding wish to proceed  PROCEDURE IN DETAIL: The patient was brought to the operating room. After induction of general anesthesia, the patient was positioned on the operative table in the prone position. All pressure points were meticulously padded.  The bed positioner was used to coronally bend the patient to angulate the rib cage superiorly and the iliac crest inferiorly.  Skin incision was then marked out with fluoroscopy and prepped and draped in the usual sterile fashion.  I made a stab incision over the left posterior superior iliac spine. I then malleted a percutaneous navigation pin into the posterior superior iliac spine.  We then obtained an intraoperative O-arm spin  We used a navigated probe to mark the skin incisions over the pedicle screw entry points from L4 to L5 bilaterally.  We then made sharp incisions over the entry points.  We used electrocautery to dissect down to the level of  the pedicle screw entry point.  We used a navigated high-speed bur to plan the size of the screw and drilled a pilot hole.  We then used a navigated tap to create the screw trajectory.  We then placed a screw shank under navigation.  This was repeated at bilateral L4 to L5.  AP and lateral fluoroscopy were used to confirm proper placement  We then made a sharp incision over the previously planned left lateral incision.  We used cautery to dissect the subcutaneous tissue down to the fascia.  We then sharply opened the fascia revealing the underlying muscle.  The external oblique, internal oblique, and transversalis muscle and transversalis fascia were opened bluntly with blunt finger dissection and spreading instruments.  The retroperitoneal space was dissected and the peritoneal contents were reflected anteriorly using blunt finger dissection.  The quadratus lumborum, transverse process and finally the psoas muscle was dissected out using blunt finger dissection.  A initial dilator was placed on the lateral surface of the psoas muscle over the L4-5 disc space as confirmed by x-ray.  The dilator was then passed through the psoas muscle and docked onto the disc.  Stimulation showed there was no proximity of any lumbar plexus nerves.  The dilator was secured in place with a K wire and subsequent dilators were passed and stimulated and again yielded no neural activity nearby.  Retractor was then passed onto the disc space and secured in place with a bed attachment.  The dilators were removed and the field on the posterior blade were directly stimulated.  There was no visual evidence or electromyographic evidence of nerves in the field.  The retractor was then opened anteriorly and again the field was stimulated  with no evidence of nerves in the field.  Annulotomy was performed with a retractable blade and discectomy was performed with pituitary rongeurs and box cutters.  The contralateral annulus was released with  Cobb and the disc space was prepared with curettes and rasps.  Trial implants were placed and with the aid of fluoroscopy with AP and lateral projections, a size 50 mm Globus Rise-L graft was chosen.  The graft was filled with demineralized bone matrix and biologic for arthrodesis.  The graft was then placed with the aid of lateral and AP fluoroscopy.  The inserter was then used to expand the graft by approximately 12mm.  After the inserter was removed, we used a funnel to pack of the disc base with additional biologic material for fusion.  The retractor was removed  We then used a navigated high-speed bur to decorticate the bilateral L4-5 facet joints.  We then used a navigated dilator and funnel to place further demineralized bone matrix and biologic directly into the bilateral facets for posterolateral fusion.  We then placed percutaneous tulip heads and towers over the screw shanks.  The bed positioner was adjusted so that the coronal bend returned to 0 degrees.  We then passed bilateral 50 mm 5.5 mm titanium rods.  The rods were secured with setscrews and final tightened.  AP and lateral fluoroscopy were used to confirm appropriate placement of all of all the hardware.  The wound was copiously irrigated.  The lateral fascia was closed with 0 Vicryl.  The subcutaneous tissue was closed with 3-0 Vicryl and running Monocryl and glue.  The posterior fascia was closed with 0 Vicryl.  The subcutaneous tissue was closed with 2-0 Vicryl, 3-0 Vicryl, Monocryl, and glue.  At the end of the case all sponge, needle, and instrument counts were correct. The patient was then transferred to the stretcher, extubated, and taken to the post-anesthesia care unit in stable hemodynamic condition.

## 2024-02-06 NOTE — H&P (Signed)
 Neurosurgery Consult Note  Assessment:  83 y/o M w/ hx Afib on Eliquis  who has LBP and LLE radiculopathy with imaging showing multilevel spondylosis worse at L4-5 (transitional anatomy) with spondylolisthesis  Plan:  L4-5 PLLIF. Please note that the L4-5 level refers to the most caudal level with anterolisthesis. Alternatively it represents the 3rd most caudal disc space (with the most caudal disc space S1-2 being autofused)     CC: back pain  HPI:     83 year old male history of AFib on Eliquis  previously seen for chronic back pain and bilateral lower extremity pain exacerbated after a fall. He has received multiple ESI in the past. Most of them have been L5-S1 likely due to severe radiographic compression preventing proper needle placement at L4-5 although he has also undergone injections at L4-5. Note that he has transitional anatomy. The injections previously provided long-lasting relief of his pain. The most recent injection 6 weeks ago did help his back pain, but he still has persistent left lower extremity radiculopathy to the left knee and shin. He also has persistent symptoms of neurogenic claudication including left thigh burning pain that is worse with activity and improves with rest. He has undergone greater than 6 weeks of physical therapy within the past 3 months which have not helped his pain. He is greatly debilitated by the pain, and has been significantly affecting his activities of daily livingHe underwent a DEXA scan last month which showed normal bone mineral density. He does not smoke    Patient Active Problem List   Diagnosis Date Noted   Bilateral leg pain 11/29/2023   Acute posthemorrhagic anemia 06/21/2022   Bilateral lower extremity edema 04/06/2022   Coronary artery disease involving native coronary artery of native heart without angina pectoris 04/05/2022   Gout 02/27/2022   Acute renal failure superimposed on stage 3b chronic kidney disease (HCC) 11/16/2021    Uremia 11/16/2021   Gout due to renal impairment 10/17/2021   Pancytopenia (HCC) 08/05/2021   SIRS (systemic inflammatory response syndrome) (HCC) 08/04/2021   Chronic kidney disease, stage 3b (HCC) 12/30/2020   Multiple falls    Spinal stenosis    S/P TKR (total knee replacement), bilateral    Arthralgia 11/20/2019   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   Lesion of liver 04/20/2019   Secondary hypercoagulable state 04/09/2019   Eosinophil count raised 02/17/2019   Acute tracheobronchitis 01/05/2019   Asthma    Vitamin D  deficiency 07/25/2018   Chronic anticoagulation 03/06/2018   Hyperlipidemia associated with type 2 diabetes mellitus (HCC) 01/28/2018   Basal cell carcinoma (BCC) of left side of nose 01/28/2018   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   Artificial knee joint present 05/20/2017   Left knee pain 05/07/2017   B12 deficiency 10/22/2016   Overweight (BMI 25.0-29.9) 10/22/2016   Primary osteoarthritis of right knee 03/29/2015   Osteoarthritis of right knee 03/29/2015   History of cholecystectomy 11/20/2014   Occult blood in stools 02/09/2014   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   Paroxysmal A-fib (HCC) 04/16/2013   Chronic heart failure with preserved ejection fraction (HFpEF) (HCC) 04/16/2013   Colon polyps 04/16/2013   NIDDM-2 with polyneuropathy and hyperglycemia 04/16/2013   Tinea cruris 04/16/2013   Diabetic neuropathy (HCC) 07/14/2012   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) 07/14/2012   Insomnia 03/04/2012   History of cardiovascular disorder 08/16/2011   Hypertensive heart disease with heart failure (HCC) 01/06/2010   Neoplasm of prostate, malignant (HCC) 01/06/2010   Hypercholesterolemia 01/06/2010   Essential  hypertension 01/06/2010   Past Medical History:  Diagnosis Date   Acute tracheobronchitis 01/05/2019   B12 deficiency    Basal cell carcinoma (BCC) of left side of nose 01/28/2018   Cancer (HCC)    Cardiomyopathy (HCC)  03/10/2018   With chronic atrial fibrillation   CHF (congestive heart failure) (HCC) 04/16/2013   Functional class II, ejection fraction 35-40%  Formatting of this note might be different from the original. Functional class II, ejection fraction 35-40%   Chronic anticoagulation    Chronic diastolic (congestive) heart failure (HCC) 04/16/2013   Functional class II, ejection fraction 35-40%  Last Assessment & Plan:  Clinically stable Volume well controlled meds reviewed   Chronic diastolic CHF (congestive heart failure) (HCC) 04/16/2013   Functional class II, ejection fraction 35-40%  Last Assessment & Plan:  Clinically stable Volume well controlled meds reviewed   Colon polyps 04/16/2013   Coronary artery disease involving native coronary artery of native heart without angina pectoris 04/05/2022   Myoview  05/25/2019: EF 60, normal perfusion, low risk CCTA (AF protocol) 04/25/20: CAC score 4242 (99th percentile)   Diabetic neuropathy (HCC)    Difficult intubation    pt reports difficult intubation during surgery in [place] in 2015   Dysrhythmia    A.Fib   Eosinophil count raised 02/17/2019   Essential hypertension 01/06/2010   Last Assessment & Plan:  Well controlled Continue med management   Gout    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  Steroid usage was discontinued several months ago.  Follow up with Dermatology as planned.   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   Last Assessment & Plan:  Id following ?ongoing abx managementy reported Request records   Infection of prosthetic right knee joint    Insomnia  03/04/2012   Grief with loss of wife 02/20/12  Last Assessment & Plan:  Improved sx  contineu xanax prn Se discussed   Lesion of liver 04/20/2019   -On cardiac CT 02/2019   Mild reactive airways disease    Neoplasm of prostate 04/16/2013   Neoplasm of prostate, 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 Transthoracic Echocardiogram-03/10/2013-[facility]: Normal left ventricular wall thickness and cavity size.  Global left ventricular systolic function is moderately reduced.  The estimated ejection fraction is 35-40%.  The left atrium is moderately enlarged.  The right atrium is mildly enlarged.  No significant valvular    Overweight (BMI 25.0-29.9) 10/22/2016   Persistent atrial fibrillation (HCC) 04/16/2013   Last Assessment & Plan:  Rate controlled Continue med management eliquis  for stroke preventino  Formatting of this note might be different from the original.  Drug  HX Current Rx Pre-ABL inefficacy Pre-ABL intolerant Post-ABL inefficacy Post-ABL intolerant max dose/24h M/Y end comments  sotalol                  dofetilide  flecainide                   propafenone                  am   S/P TKR (total knee replacement), bilateral    Secondary hypercoagulable state 04/09/2019   Tinea cruris 04/16/2013   Type 2 diabetes mellitus with diabetic polyneuropathy, without long-term current use of insulin  (HCC) 04/16/2013   Last Assessment & Plan:  Labs today Pt reports well controlled on ambulatory monitoring   Vitamin D  deficiency 07/25/2018    Past Surgical History:  Procedure Laterality Date   ATRIAL FIBRILLATION ABLATION N/A 03/12/2019   Procedure: ATRIAL FIBRILLATION ABLATION;   Surgeon: Inocencio Soyla Lunger, MD;  Location: [facility];  Service: Cardiovascular;  Laterality: N/A;   ATRIAL FIBRILLATION ABLATION N/A 04/29/2020   Procedure: ATRIAL FIBRILLATION ABLATION;  Surgeon: Inocencio Soyla Lunger, MD;  Location: [facility];  Service: Cardiovascular;  Laterality: N/A;   CARDIAC ELECTROPHYSIOLOGY STUDY AND ABLATION     CARDIOVERSION     CARDIOVERSION N/A 10/10/2018   Procedure: CARDIOVERSION;  Surgeon: Delford Maude BROCKS, MD;  Location: [facility] ENDOSCOPY;  Service: Cardiovascular;  Laterality: N/A;   CARDIOVERSION N/A 05/18/2020   Procedure: CARDIOVERSION;  Surgeon: Alveta Aleene PARAS, MD;  Location: [facility] ENDOSCOPY;  Service: Cardiovascular;  Laterality: N/A;   CARDIOVERSION N/A 12/09/2020   Procedure: CARDIOVERSION;  Surgeon: Pietro Redell RAMAN, MD;  Location: [facility] ENDOSCOPY;  Service: Cardiovascular;  Laterality: N/A;   CHOLECYSTECTOMY     PROSTATECTOMY     REPLACEMENT TOTAL KNEE BILATERAL      Medications Prior to Admission  Medication Sig Dispense Refill Last Dose/Taking   allopurinol  (ZYLOPRIM ) 300 MG tablet TAKE 1 TABLET BY MOUTH EVERY DAY 90 tablet 0 02/06/2024 at  4:15 AM   atorvastatin  (LIPITOR) 20 MG tablet TAKE 1 TABLET DAILY (PLEASE SCHEDULE A PHYSICAL FOR MORE REFILLS) 90 tablet 0 02/06/2024 at  4:15 AM   carvedilol  (COREG ) 3.125 MG tablet TAKE 1 TABLET BY MOUTH TWICE A DAY 180 tablet 0 02/06/2024 at  4:15 AM   flecainide  (TAMBOCOR ) 100 MG tablet TAKE 1 TABLET BY MOUTH TWICE A DAY 180 tablet 3 02/06/2024 at  4:15 AM   fluticasone  (FLONASE ) 50 MCG/ACT nasal spray Place 2 sprays into both nostrils daily. 16 g 2 02/06/2024 at  4:15 AM   gabapentin  (NEURONTIN ) 300 MG capsule Take one tablet in the morning, one tablet in the afternoon, and 2 tablets at bedtime 360 capsule 0 02/06/2024 at  4:15 AM   hydrALAZINE  (APRESOLINE ) 25 MG tablet TAKE 1 TABLET (25 MG TOTAL) BY MOUTH 4 (FOUR) TIMES DAILY. (Patient taking differently: Take 25 mg by mouth in the morning and at  bedtime.) 360 tablet 3 02/06/2024 at  4:15 AM   montelukast  (SINGULAIR ) 10 MG tablet Take 10 mg by mouth in the morning.   02/06/2024 at  4:15 AM   potassium chloride  SA (KLOR-CON  M20) 20 MEQ tablet Take 1 tablet (20 mEq total) by mouth daily. 90 tablet 3 02/05/2024   RESTASIS  0.05 % ophthalmic emulsion Place 1 drop into both eyes in the morning.   02/05/2024   torsemide  (DEMADEX ) 20 MG tablet Take 1 tablet (20 mg total) by mouth 2 (two) times daily. 180 tablet 0 02/05/2024   ACCU-CHEK AVIVA PLUS test strip 1 EACH BY OTHER ROUTE DAILY. DX E11.9 (Patient taking differently: 2 each by Other route daily. Dx E11.9) 100 strip 12    apixaban  (  ELIQUIS ) 5 MG TABS tablet Take 1 tablet (5 mg total) by mouth 2 (two) times daily. 180 tablet 1 02/02/2024   Continuous Blood Gluc Receiver (FREESTYLE LIBRE 2 READER) DEVI 1 each by Does not apply route daily. 1 each 2    Continuous Blood Gluc Sensor (FREESTYLE LIBRE 2 SENSOR) MISC 1 each by Does not apply route daily. 2 each 6    Allergies[1]  Social History   Tobacco Use   Smoking status: Never    Passive exposure: Never   Smokeless tobacco: Never  Substance Use Topics   Alcohol use: Not Currently    Alcohol/week: 14.0 standard drinks of alcohol    Types: 14 Shots of liquor per week    Comment: sips on rum and has brandy before bed nightly    Family History  Problem Relation Age of Onset   Cancer Mother    Depression Mother    Early death Mother    Cancer Father    Depression Father    Early death Father      Review of Systems Pertinent items are noted in HPI.  Objective:   Patient Vitals for the past 8 hrs:  BP Temp Temp src Pulse Resp SpO2 Height Weight  02/06/24 0555 130/61 98.7 F (37.1 C) Oral 72 17 95 % 5' 10 (1.778 m) 79.4 kg   No intake/output data recorded. No intake/output data recorded.    Exam: RUE: 5/5 grip LUE:  5/5 grip RLE: 5/5 IP, 5/5 quad, 5/5 ham, 5/5 DF, 5/5 EHL, 5/5 PF LLE: 5/5 IP, 5/5 quad, 5/5 ham, 5/5 DF,  5/5 EHL, 5/5 PF Sensation to light intact      Data ReviewCBC:  Lab Results  Component Value Date   WBC 3.7 (L) 02/05/2024   RBC 3.63 (L) 02/05/2024   BMP:  Lab Results  Component Value Date   GLUCOSE 105 (H) 02/05/2024   CO2 26 02/05/2024   BUN 25 (H) 02/05/2024   BUN 26 04/26/2022   CREATININE 1.39 (H) 02/05/2024   CREATININE 1.72 (H) 10/17/2021   CALCIUM  8.9 02/05/2024         [1]  Allergies Allergen Reactions   Amlodipine  Other (See Comments)    Severe edema, chest pain, nausea    Chlorhexidine Gluconate Itching    Only itching with wipes, not with soap

## 2024-02-06 NOTE — Plan of Care (Signed)

## 2024-02-06 NOTE — Progress Notes (Signed)
 Pt did not have a prior allergy to CHG. However, after applying CHG wipes on day of surgery, his back was very itchy. He said the soap the night before did not bother him. Allergy added

## 2024-02-06 NOTE — Transfer of Care (Signed)
 Immediate Anesthesia Transfer of Care Note  Patient: Charles Lloyd.  Procedure(s) Performed: ANTERIOR PRONE LATERAL LUMBAR FUSION LUMBAR FOUR-LUMBAR FIVE APPLICATION OF O-ARM  Patient Location: PACU  Anesthesia Type:General  Level of Consciousness: awake  Airway & Oxygen  Therapy: Patient Spontanous Breathing  Post-op Assessment: Report given to RN  Post vital signs: stable  Last Vitals:  Vitals Value Taken Time  BP 140/73 02/06/24 11:35  Temp 36.5 C 02/06/24 11:35  Pulse 61 02/06/24 11:41  Resp 10 02/06/24 11:41  SpO2 92 % 02/06/24 11:41  Vitals shown include unfiled device data.  Last Pain:  Vitals:   02/06/24 0642  TempSrc:   PainSc: 0-No pain         Complications: There were no known notable events for this encounter.

## 2024-02-06 NOTE — Progress Notes (Signed)
 Assessment 83 y/o M w/ hx Afib on Eliquis  who has LBP and LLE radiculopathy with imaging showing multilevel spondylosis worse at L4-5 (transitional anatomy) with spondylolisthesis. Now s/p L4-5 PLLIF  LOS: 1 day    Plan: AAT DAT Postop films PTOT DVT ppx on 12/13 Eliquis  on 12/18   Subjective: Pt denied leg pain  Objective: Vital signs in last 24 hours: Temp:  [98.7 F (37.1 C)] 98.7 F (37.1 C) (12/11 0555) Pulse Rate:  [72] 72 (12/11 0555) Resp:  [17] 17 (12/11 0555) BP: (130)/(61) 130/61 (12/11 0555) SpO2:  [95 %] 95 % (12/11 0555) Weight:  [79.4 kg] 79.4 kg (12/11 0555)  Intake/Output from previous day: No intake/output data recorded. Intake/Output this shift: Total I/O In: 1750 [I.V.:1400; IV Piggyback:350] Out: 270 [Urine:250; Blood:20]  Exam: RUE: 5/5 grip LUE: 5/5 grip RLE: 5/5 IP, 5/5 quad, 5/5 ham, 5/5 DF, 5/5 EHL, 5/5 PF LLE: 5/5 IP, 5/5 quad, 5/5 ham, 5/5 DF, 5/5 EHL, 5/5 PF Sensation to light intact Belly soft   Lab Results: Recent Labs    02/05/24 1140  WBC 3.7*  HGB 10.7*  HCT 32.9*  PLT 182   BMET Recent Labs    02/05/24 1140  NA 143  K 3.6  CL 107  CO2 26  GLUCOSE 105*  BUN 25*  CREATININE 1.39*  CALCIUM  8.9       Dorn JONELLE Glade 02/06/2024, 11:30 AM

## 2024-02-07 ENCOUNTER — Encounter (HOSPITAL_COMMUNITY): Payer: Self-pay

## 2024-02-07 DIAGNOSIS — I480 Paroxysmal atrial fibrillation: Secondary | ICD-10-CM | POA: Diagnosis present

## 2024-02-07 DIAGNOSIS — I2510 Atherosclerotic heart disease of native coronary artery without angina pectoris: Secondary | ICD-10-CM | POA: Diagnosis present

## 2024-02-07 DIAGNOSIS — Z818 Family history of other mental and behavioral disorders: Secondary | ICD-10-CM | POA: Diagnosis not present

## 2024-02-07 DIAGNOSIS — Z85828 Personal history of other malignant neoplasm of skin: Secondary | ICD-10-CM | POA: Diagnosis not present

## 2024-02-07 DIAGNOSIS — Z923 Personal history of irradiation: Secondary | ICD-10-CM | POA: Diagnosis not present

## 2024-02-07 DIAGNOSIS — E1122 Type 2 diabetes mellitus with diabetic chronic kidney disease: Secondary | ICD-10-CM | POA: Diagnosis present

## 2024-02-07 DIAGNOSIS — I130 Hypertensive heart and chronic kidney disease with heart failure and stage 1 through stage 4 chronic kidney disease, or unspecified chronic kidney disease: Secondary | ICD-10-CM | POA: Diagnosis present

## 2024-02-07 DIAGNOSIS — E7849 Other hyperlipidemia: Secondary | ICD-10-CM | POA: Diagnosis present

## 2024-02-07 DIAGNOSIS — I5032 Chronic diastolic (congestive) heart failure: Secondary | ICD-10-CM | POA: Diagnosis present

## 2024-02-07 DIAGNOSIS — R296 Repeated falls: Secondary | ICD-10-CM | POA: Diagnosis present

## 2024-02-07 DIAGNOSIS — Z860100 Personal history of colon polyps, unspecified: Secondary | ICD-10-CM | POA: Diagnosis not present

## 2024-02-07 DIAGNOSIS — J45909 Unspecified asthma, uncomplicated: Secondary | ICD-10-CM | POA: Diagnosis present

## 2024-02-07 DIAGNOSIS — Z7901 Long term (current) use of anticoagulants: Secondary | ICD-10-CM | POA: Diagnosis not present

## 2024-02-07 DIAGNOSIS — M109 Gout, unspecified: Secondary | ICD-10-CM | POA: Diagnosis present

## 2024-02-07 DIAGNOSIS — I429 Cardiomyopathy, unspecified: Secondary | ICD-10-CM | POA: Diagnosis present

## 2024-02-07 DIAGNOSIS — N1832 Chronic kidney disease, stage 3b: Secondary | ICD-10-CM | POA: Diagnosis present

## 2024-02-07 DIAGNOSIS — M4316 Spondylolisthesis, lumbar region: Secondary | ICD-10-CM | POA: Diagnosis present

## 2024-02-07 DIAGNOSIS — Z79899 Other long term (current) drug therapy: Secondary | ICD-10-CM | POA: Diagnosis not present

## 2024-02-07 DIAGNOSIS — E1169 Type 2 diabetes mellitus with other specified complication: Secondary | ICD-10-CM | POA: Diagnosis present

## 2024-02-07 DIAGNOSIS — Z96653 Presence of artificial knee joint, bilateral: Secondary | ICD-10-CM | POA: Diagnosis present

## 2024-02-07 DIAGNOSIS — E1142 Type 2 diabetes mellitus with diabetic polyneuropathy: Secondary | ICD-10-CM | POA: Diagnosis present

## 2024-02-07 DIAGNOSIS — Z888 Allergy status to other drugs, medicaments and biological substances status: Secondary | ICD-10-CM | POA: Diagnosis not present

## 2024-02-07 DIAGNOSIS — E7800 Pure hypercholesterolemia, unspecified: Secondary | ICD-10-CM | POA: Diagnosis present

## 2024-02-07 LAB — GLUCOSE, CAPILLARY: Glucose-Capillary: 167 mg/dL — ABNORMAL HIGH (ref 70–99)

## 2024-02-07 MED ORDER — CYCLOBENZAPRINE HCL 10 MG PO TABS: 10.0000 mg | ORAL_TABLET | Freq: Three times a day (TID) | ORAL | 0 refills | Status: DC | PRN

## 2024-02-07 MED ORDER — DOCUSATE SODIUM 100 MG PO CAPS: 100.0000 mg | ORAL_CAPSULE | Freq: Every day | ORAL | 2 refills | Status: AC | PRN

## 2024-02-07 MED ORDER — OXYCODONE HCL 5 MG PO TABS: 5.0000 mg | ORAL_TABLET | Freq: Four times a day (QID) | ORAL | 0 refills | Status: DC | PRN

## 2024-02-07 MED ADMIN — Potassium Chloride Microencapsulated Crys ER Tab 20 mEq: 20 meq | ORAL | NDC 00245531989

## 2024-02-07 NOTE — Evaluation (Signed)
 Physical Therapy Evaluation  Patient Details Name: Charles Lloyd. MRN: [id number] DOB: 06-14-1940 Today's Date: 02/07/2024  History of Present Illness  Pt is a 83 yo male s/p L4-5, left side approach, prone transpsoas lumbar interbody fusion and L4-5 posterolateral fusion. PHMx: chronic HFpEF, Afib, HTN, DM2, Bil TKA, gout, prostrate and skin CA, Bil LE neuropathy.  Clinical Impression  Pt admitted with above diagnosis. At the time of PT eval, pt was able to demonstrate transfers and ambulation with up to mod assist. Initially at a CGA level but at end of session had 3 near falls due to posterior LOB when attempting stand>sit, sit>stand, and chair>bed step pivot transfer. Pt did not report fatigue during this time, and states it is because he has peripheral neuropathy. Pt was educated on precautions, positioning recommendations, appropriate activity progression, and car transfer. Pt currently with functional limitations due to the deficits listed below (see PT Problem List). Pt will benefit from skilled PT to increase their independence and safety with mobility to allow discharge to the venue listed below.          If plan is discharge home, recommend the following: A little help with walking and/or transfers;A little help with bathing/dressing/bathroom;Assistance with cooking/housework;Assist for transportation;Help with stairs or ramp for entrance   Can travel by private vehicle        Equipment Recommendations None recommended by PT  Recommendations for Other Services       Functional Status Assessment Patient has had a recent decline in their functional status and demonstrates the ability to make significant improvements in function in a reasonable and predictable amount of time.     Precautions / Restrictions Precautions Precautions: Back Precaution Booklet Issued: Yes (comment) Recall of Precautions/Restrictions: Impaired Precaution/Restrictions Comments: Reviewed handout  and pt was cued for precautions during functional mobility Restrictions Weight Bearing Restrictions Per Provider Order: No      Mobility  Bed Mobility Overal bed mobility: Needs Assistance Bed Mobility: Rolling, Sidelying to Sit, Sit to Sidelying Rolling: Supervision Sidelying to sit: Supervision     Sit to sidelying: Min assist General bed mobility comments: Requires assist to elevate LE's up into bed at end of session. HOB flat and rails lowered to simulate home environment.    Transfers Overall transfer level: Needs assistance Equipment used: Rollator (4 wheels) Transfers: Sit to/from Stand Sit to Stand: Contact guard assist, Mod assist           General transfer comment: Assist for LOB and uncontrolled descent/difficulty powering up to stand. Pt with 3 near falls at end of session with transfers. Initially CGA level to stand.    Ambulation/Gait Ambulation/Gait assistance: Contact guard assist Gait Distance (Feet): 300 Feet Assistive device: Rollator (4 wheels) Gait Pattern/deviations: Step-through pattern, Decreased stride length, Trunk flexed Gait velocity: Decreased Gait velocity interpretation: <1.8 ft/sec, indicate of risk for recurrent falls   General Gait Details: Multimodal cues for improved posture, closer walker proximity and forward gaze. Pt able to make corrective changes but unable to maintain. No assist required but hands on guarding provided throughout. Pt utilized rollator as he uses this at home. No overt LOB with rollator use.  Stairs Stairs:  (Pt declined stair training. States he feels comfortable with the 1 step to enter, and has a stair lift at home.)          Wheelchair Mobility     Tilt Bed    Modified Rankin (Stroke Patients Only)       Balance  Overall balance assessment: Needs assistance Sitting-balance support: No upper extremity supported, Feet supported Sitting balance-Leahy Scale: Fair     Standing balance support: No  upper extremity supported, During functional activity, Reliant on assistive device for balance Standing balance-Leahy Scale: Poor                               Pertinent Vitals/Pain Pain Assessment Pain Assessment: 0-10 Pain Score: 5  Pain Location: left side lower back/hip Pain Descriptors / Indicators: Aching, Grimacing, Guarding, Sore Pain Intervention(s): Limited activity within patient's tolerance, Monitored during session, Repositioned    Home Living Family/patient expects to be discharged to:: Private residence Living Arrangements: Alone Available Help at Discharge: Family;Available PRN/intermittently Type of Home: House Home Access: Stairs to enter Entrance Stairs-Rails: None Entrance Stairs-Number of Steps: 1 Alternate Level Stairs-Number of Steps: stair lift Home Layout: Two level;Bed/bath upstairs Home Equipment: Patent Examiner (4 wheels);Hand held shower head;Shower seat Additional Comments: no handles on toilet riser, 2 rollators (one for upstairs and one for downstairs)    Prior Function Prior Level of Function : Independent/Modified Independent               ADLs Comments: Drives     Extremity/Trunk Assessment   Upper Extremity Assessment Upper Extremity Assessment: Defer to OT evaluation    Lower Extremity Assessment Lower Extremity Assessment: Generalized weakness (Bilaterally with peripheral neuropathy)    Cervical / Trunk Assessment Cervical / Trunk Assessment: Back Surgery  Communication   Communication Communication: No apparent difficulties    Cognition Arousal: Alert Behavior During Therapy: Impulsive   PT - Cognitive impairments: No apparent impairments                         Following commands: Impaired Following commands impaired: Follows one step commands inconsistently     Cueing Cueing Techniques: Verbal cues     General Comments      Exercises     Assessment/Plan    PT Assessment  Patient needs continued PT services  PT Problem List Decreased strength;Decreased activity tolerance;Decreased balance;Decreased mobility;Decreased knowledge of use of DME;Decreased safety awareness;Decreased knowledge of precautions;Pain       PT Treatment Interventions DME instruction;Gait training;Stair training;Functional mobility training;Therapeutic activities;Therapeutic exercise;Balance training;Patient/family education    PT Goals (Current goals can be found in the Care Plan section)  Acute Rehab PT Goals Patient Stated Goal: To be able to manage at home alone. PT Goal Formulation: With patient Time For Goal Achievement: 02/14/24 Potential to Achieve Goals: Good    Frequency Min 5X/week     Co-evaluation               AM-PAC PT 6 Clicks Mobility  Outcome Measure Help needed turning from your back to your side while in a flat bed without using bedrails?: A Little Help needed moving from lying on your back to sitting on the side of a flat bed without using bedrails?: A Little Help needed moving to and from a bed to a chair (including a wheelchair)?: A Little Help needed standing up from a chair using your arms (e.g., wheelchair or bedside chair)?: A Little Help needed to walk in hospital room?: A Little Help needed climbing 3-5 steps with a railing? : A Little 6 Click Score: 18    End of Session Equipment Utilized During Treatment: Gait belt Activity Tolerance: Patient tolerated treatment well Patient left: with call bell/phone within  reach;in chair Nurse Communication: Mobility status PT Visit Diagnosis: Unsteadiness on feet (R26.81);Pain Pain - part of body:  (back)    Time: 8970-8943 PT Time Calculation (min) (ACUTE ONLY): 27 min   Charges:   PT Evaluation $PT Eval Low Complexity: 1 Low PT Treatments $Gait Training: 8-22 mins PT General Charges $$ ACUTE PT VISIT: 1 Visit         Leita Sable, PT, DPT Acute Rehabilitation Services Secure Chat  Preferred Office: [phone]   Leita JONETTA Sable 02/07/2024, 11:51 AM

## 2024-02-07 NOTE — Evaluation (Signed)
 Occupational Therapy Evaluation and Discharge Patient Details Name: Charles Lloyd. MRN: [id number] DOB: September 19, 1940 Today's Date: 02/07/2024   History of Present Illness   Pt is a 83 yo male s/p L4-5, left side approach, prone transpsoas lumbar interbody fusion and L4-5 posterolateral fusion. PHMx: chronic HFpEF, Afib, HTN, DM2, Bil TKA, gout, prostrate and skin CA, Bil LE neuropathy.     Clinical Impressions This 83 yo male admitted and underwent above presents to acute OT with all education completed, reacher provided, and concerns conveyed to pt and son about him being home alone at present due to back precautions and needing A to recover from 3 potential falls while working with PT. Pt not open to rehab prior to home. No further acute OT needs, we will D/C from acute OT with follow up HHOT recommended at as well as 24/7 for the first 2-3 days to make sure pt is managing okay and following his back precautions.     If plan is discharge home, recommend the following:   A little help with walking and/or transfers;A little help with bathing/dressing/bathroom;Assistance with cooking/housework;Help with stairs or ramp for entrance;Assist for transportation     Functional Status Assessment   Patient has had a recent decline in their functional status and demonstrates the ability to make significant improvements in function in a reasonable and predictable amount of time.     Equipment Recommendations   None recommended by OT      Precautions/Restrictions   Precautions Precautions: Back Precaution Booklet Issued: Yes (comment) Recall of Precautions/Restrictions: Impaired Precaution/Restrictions Comments: still trying to bend over to get to underwear and pants--even after educating him from the begninning and reminding him during the task Restrictions Weight Bearing Restrictions Per Provider Order: No     Mobility Bed Mobility Overal bed mobility: Needs Assistance Bed  Mobility: Rolling, Sidelying to Sit Rolling: Supervision Sidelying to sit: Supervision       General bed mobility comments: VCs for technique    Transfers Overall transfer level: Needs assistance Equipment used: Rolling walker (2 wheels) Transfers: Sit to/from Stand Sit to Stand: Contact guard assist           General transfer comment: VCs for safety and posture      Balance Overall balance assessment: Needs assistance Sitting-balance support: No upper extremity supported, Feet supported Sitting balance-Leahy Scale: Fair     Standing balance support: No upper extremity supported, During functional activity Standing balance-Leahy Scale: Fair                             ADL either performed or assessed with clinical judgement   ADL Overall ADL's : Needs assistance/impaired Eating/Feeding: Independent;Sitting     Grooming Details (indicate cue type and reason): Educated on use of 2 cups for brushing teeth (spit and rinse cups)-pt reports the stuff he uses on the his teeth he brushes on the spits once and leaves it on Upper Body Bathing: Independent;Set up;Supervision/ safety Upper Body Bathing Details (indicate cue type and reason): when rinsing off turn slowing in a circle to bend and twist to get rinsed off   Lower Body Bathing Details (indicate cue type and reason): Educated on not bending over to wash feet, while seated cross one leg over the other to wash feet; when rinsing off turn slowing in a circle to bend and twist to get rinsed off Upper Body Dressing : Set up;Supervision/safety;Sitting     Lower  Body Dressing Details (indicate cue type and reason): Pt would get one leg in underwear and pants then drop the other leg of each clothing down on the floor, get other foot in and then start to bend over to pull them up. Had to stop him with both underwear and pants. Left him sitting on EOB with RN in to give him meds and told him I would be right back to  show him how to do his other leg with a reacher. When I came back (less than 30 seconds) he had again tried to bend over to grab pants and RN had assisted him with getting his other leg in. Removed the one leg from the pants and showed him how to use the reacher to get pants on other leg and pull them up without bending over--he returned demonstrated. Offered to show him the sock aid for one of his left sock since it is harder for him to do and he reported that this activity really sets his left hip (sciatica off); but he politely refused education on it. Has lace up and tie shoes--spoke to him and son (over the phone) that sketchers slip-ins would be much better (pt cannot tie his own shoes at present without pain and/or breaking back precautions). Toilet Transfer: Ambulation;Comfort height toilet;Grab bars;Rolling walker (2 wheels);Contact guard assist   Toileting- Clothing Manipulation and Hygiene: Sit to/from stand;Contact guard assist         General ADL Comments: Overall needs CGA when up on his feet due to decreased balance even with DME. Spoke with son over phone (PT present as well) about our concerns about pt and his back precautions as well was almost falling with PT 3 times during their session. Son reports he has alot of problems with balance due to his PN. Son can check on him regularly and stay around some but not 24/7 which we are recommending. Pt not open to going to rehab prior to home. We will recommend [facility] services and son wants names of agencies that do private pay that he may use to for help for his dad--RN contacted CM via secure chat and made them aware of HH services recommended and private pay agency list.     Vision Baseline Vision/History: 0 No visual deficits Patient Visual Report: No change from baseline              Pertinent Vitals/Pain Pain Assessment Pain Assessment: 0-10 Pain Score:  (at rest a 2; with activity up to 9) Pain Location: left side lower  back/hip Pain Descriptors / Indicators: Aching, Grimacing, Guarding, Sore Pain Intervention(s): Limited activity within patient's tolerance, Monitored during session, Repositioned, Patient requesting pain meds-RN notified, RN gave pain meds during session     Extremity/Trunk Assessment Upper Extremity Assessment Upper Extremity Assessment: Overall WFL for tasks assessed           Communication Communication Communication: No apparent difficulties   Cognition Arousal: Alert Behavior During Therapy: Impulsive Cognition: Cognition impaired     Awareness: Online awareness impaired Memory impairment (select all impairments): Short-term memory Attention impairment (select first level of impairment): Sustained attention Executive functioning impairment (select all impairments): Problem solving OT - Cognition Comments: still trying to bend over to get to underwear and pants--even after educating him from the begninning and reminding him during the task                 Following commands: Impaired Following commands impaired: Follows one step commands inconsistently  Cueing   Cueing Techniques: Verbal cues              Home Living Family/patient expects to be discharged to:: Private residence Living Arrangements: Alone Available Help at Discharge: Family;Available PRN/intermittently Type of Home: House Home Access: Stairs to enter Entrance Stairs-Number of Steps: 1 Entrance Stairs-Rails: None Home Layout: Two level;Bed/bath upstairs Alternate Level Stairs-Number of Steps: stair lift   Bathroom Shower/Tub: Walk-in shower;Door   [employer] Toilet: Standard     Home Equipment: Patent Examiner (4 wheels);Hand held shower head;Shower seat   Additional Comments: no handles on toilet riser, 2 rollators (one for upstairs and one for downstairs)      Prior Functioning/Environment Prior Level of Function : Independent/Modified Independent                ADLs Comments: Drives    OT Problem List: Decreased strength;Decreased range of motion;Impaired balance (sitting and/or standing);Pain        OT Goals(Current goals can be found in the care plan section)   Acute Rehab OT Goals Patient Stated Goal: to go home         AM-PAC OT 6 Clicks Daily Activity     Outcome Measure Help from another person eating meals?: None Help from another person taking care of personal grooming?: A Little Help from another person toileting, which includes using toliet, bedpan, or urinal?: A Little Help from another person bathing (including washing, rinsing, drying)?: A Little Help from another person to put on and taking off regular upper body clothing?: A Little Help from another person to put on and taking off regular lower body clothing?: A Little 6 Click Score: 19   End of Session Equipment Utilized During Treatment: Rolling walker (2 wheels) Nurse Communication:  (Needs HHOT, Aide, and list of private pay agencies)  Activity Tolerance: Patient tolerated treatment well Patient left: in chair;with call bell/phone within reach  OT Visit Diagnosis: Unsteadiness on feet (R26.81);Other abnormalities of gait and mobility (R26.89);Pain Pain - Right/Left: Left Pain - part of body:  (lower back and hip)                Time: 9098-9062 OT Time Calculation (min): 36 min Charges:  OT General Charges $OT Visit: 1 Visit OT Evaluation $OT Eval Moderate Complexity: 1 Mod  Cathy L. OT Acute Rehabilitation Services Office [phone]    Rodgers Dorothyann Distel 02/07/2024, 11:21 AM

## 2024-02-07 NOTE — Discharge Summary (Addendum)
°  Physician Discharge Summary  Patient ID: Charles Lloyd Juanito Mickey. MRN: [id number] DOB/AGE: 1940/10/17 83 y.o.  Admit date: 02/06/2024 Discharge date: 02/07/2024  Admission Diagnoses:  L4-5 spondylolisthesis  Discharge Diagnoses:  Same Principal Problem:   Elective surgery   Discharged Condition: Stable  Hospital Course:  Thong Feeny. is a 83 y.o. male who underwent L4-5 PLLIF. No issues intra-op Postoperatively, the patient was mobilized with the help PT, pain was well-controlled with oral meds. Patient reported improved ambulation ability postop. Patient was deemed ready for discharge.   Treatments: Surgery - L4-5 PLLIF  Discharge Exam: Blood pressure (!) 149/76, pulse 75, temperature 97.9 F (36.6 C), temperature source Oral, resp. rate 19, height 5' 10 (1.778 m), weight 79.4 kg, SpO2 95%. Awake, alert, oriented Speech fluent, appropriate CN grossly intact 5/5 BUE/BLE Wound c/d/i  Disposition: Discharge disposition: 01-Home or Self Care       Discharge Instructions     Incentive spirometry RT   Complete by: As directed    Increase activity slowly   Complete by: As directed    No dressing needed   Complete by: As directed         Contact information for follow-up providers     Darnella Dorn SAUNDERS, MD Follow up.   Specialty: Neurosurgery Contact information: [address] [place], [address] [place] [address] [phone]              Contact information for after-discharge care     Home Medical Care     [facility]) .   Service: Home Health Services Contact information: [address] [place]  [address] [phone]                     Signed: Dorn SAUNDERS Darnella 02/07/2024, 12:31 PM

## 2024-02-07 NOTE — Progress Notes (Signed)
 Patient awaiting family for discharge home, Patient in no acute distress nor complaints of pain nor discomfort; incision on back is clean, dry and intact; No c/o pain at this time. Room was checked and accounted for all patient's belongings; discharge instructions concerning her medications, incision care, follow up appointment and when to call the doctor as needed were all discussed with patient and son by RN and they expressed understanding on the instructions given.

## 2024-02-07 NOTE — TOC Transition Note (Signed)
 Transition of Care [facility]) - Discharge Note   Patient Details  Name: Charles Lloyd. MRN: [id number] Date of Birth: 22-Jul-1940  Transition of Care [facility]) CM/SW Contact:  Andrez JULIANNA Oather, RN Phone Number: 02/07/2024, 12:01 PM   Clinical Narrative:     Pt is discharging home with home health services arranged through [facility]. Information on the AVS. [facility] will contact him for the first home visit.  Pts son to provide transport home.  Final next level of care: Home w Home Health Services Barriers to Discharge: No Barriers Identified   Patient Goals and CMS Choice   CMS Medicare.gov Compare Post Acute Care list provided to:: Patient Choice offered to / list presented to : Patient, Adult Children      Discharge Placement                       Discharge Plan and Services Additional resources added to the After Visit Summary for                            [facility] Arranged: PT, OT HH Agency:  [facility]) Date [facility] Agency Contacted: 02/07/24   Representative spoke with at [facility] Agency: [place]  Social Drivers of Health (SDOH) Interventions SDOH Screenings   Food Insecurity: No Food Insecurity (12/21/2022)  Housing: Low Risk (12/21/2022)  Transportation Needs: No Transportation Needs (12/21/2022)  Utilities: Not At Risk (12/21/2022)  Alcohol Screen: Low Risk (12/21/2022)  Depression (PHQ2-9): Medium Risk (07/31/2023)  Financial Resource Strain: Low Risk (12/21/2022)  Physical Activity: Inactive (12/21/2022)  Social Connections: Socially Isolated (12/21/2022)  Stress: No Stress Concern Present (12/21/2022)  Tobacco Use: Low Risk (02/06/2024)  Health Literacy: Adequate Health Literacy (12/21/2022)     Readmission Risk Interventions    08/08/2021   10:00 AM  Readmission Risk Prevention Plan  Transportation Screening Complete  Medication Review (RN Care Manager) Complete  PCP or Specialist appointment within 3-5 days of discharge Complete  HRI or Home Care Consult  Complete  SW Recovery Care/Counseling Consult Not Complete  Palliative Care Screening Not Applicable  Skilled Nursing Facility Not Applicable

## 2024-02-10 ENCOUNTER — Telehealth: Payer: Self-pay

## 2024-02-10 NOTE — Transitions of Care (Post Inpatient/ED Visit) (Signed)
° °  02/10/2024  Name: Charles Lloyd Charles Lloyd. MRN: [id number] DOB: 1941-02-06  Today's TOC FU Call Status: Today's TOC FU Call Status:: Unsuccessful Call (1st Attempt) Unsuccessful Call (1st Attempt) Date: 02/10/24  Attempted to reach the patient regarding the most recent Inpatient/ED visit.  Follow Up Plan: Additional outreach attempts will be made to reach the patient to complete the Transitions of Care (Post Inpatient/ED visit) call.  [name] Ee, RN, BSN, CEN [employer]- Transition of Care Team.  [facility] [phone]

## 2024-02-10 NOTE — Transitions of Care (Post Inpatient/ED Visit) (Signed)
° °  02/10/2024  Name: Charles Lloyd Juanito Mickey. MRN: [id number] DOB: October 05, 1940  Today's TOC FU Call Status: Today's TOC FU Call Status:: Unsuccessful Call (2nd Attempt) Unsuccessful Call (2nd Attempt) Date: 02/10/24  Attempted to reach the patient regarding the most recent Inpatient/ED visit.  Follow Up Plan: Additional outreach attempts will be made to reach the patient to complete the Transitions of Care (Post Inpatient/ED visit) call.   [name] Ee, RN, BSN, CEN [employer]- Transition of Care Team.  [facility] [phone]

## 2024-02-11 ENCOUNTER — Telehealth: Payer: Self-pay

## 2024-02-11 NOTE — Transitions of Care (Post Inpatient/ED Visit) (Signed)
 02/11/2024  Name: Charles Lloyd. MRN: [id number] DOB: 02/12/1941  Today's TOC FU Call Status: Today's TOC FU Call Status:: Successful TOC FU Call Completed TOC FU Call Complete Date: 02/11/24  Patient's Name and Date of Birth confirmed. Name, DOB  Transition Care Management Follow-up Telephone Call How have you been since you were released from the hospital?: Better (PT just left.  Had a fall on 02/07/2024  no other falls.) Any questions or concerns?: No  Items Reviewed: Did you receive and understand the discharge instructions provided?: Yes Medications obtained,verified, and reconciled?: Yes (Medications Reviewed) Any new allergies since your discharge?: No Dietary orders reviewed?: NA Do you have support at home?: Yes People in Home [RPT]: child(ren), adult Name of Support/Comfort Primary Source: son  Medications Reviewed Today: Medications Reviewed Today     Reviewed by Rumalda [name] PENNER, RN (Registered Nurse) on 02/11/24 at 1139  Med List Status: <None>   Medication Order Taking? Sig Documenting Provider Last Dose Status Informant  ACCU-CHEK AVIVA PLUS test strip [id number] Yes 1 EACH BY OTHER ROUTE DAILY. DX E11.9 Theophilus Andrews, Tully GRADE, MD  Active Self  allopurinol  (ZYLOPRIM ) 300 MG tablet [id number] Yes TAKE 1 TABLET BY MOUTH EVERY DAY Theophilus Andrews, Tully GRADE, MD  Active Self  apixaban  (ELIQUIS ) 5 MG TABS tablet [id number]  Take 1 tablet (5 mg total) by mouth 2 (two) times daily.  Patient not taking: Reported on 02/11/2024   Theophilus Andrews, Tully GRADE, MD  Active Self           Med Note CLAUD MICHEAL ONEIDA Pablo Feb 03, 2024 12:55 PM) On hold due to upcoming procedure.  atorvastatin  (LIPITOR) 20 MG tablet [id number] Yes TAKE 1 TABLET DAILY (PLEASE SCHEDULE A PHYSICAL FOR MORE REFILLS) Panosh, Apolinar POUR, MD  Active Self  carvedilol  (COREG ) 3.125 MG tablet [id number] Yes TAKE 1 TABLET BY MOUTH TWICE A DAY Theophilus Andrews, Tully GRADE, MD  Active Self  Continuous Blood  Gluc Receiver (FREESTYLE LIBRE 2 READER) [place] [id number] Yes 1 each by Does not apply route daily. Theophilus Andrews, Tully GRADE, MD  Active Self  Continuous Blood Gluc Sensor (FREESTYLE LIBRE 2 SENSOR) [place] [id number] Yes 1 each by Does not apply route daily. Theophilus Andrews, Tully GRADE, MD  Active Self  cyclobenzaprine  (FLEXERIL ) 10 MG tablet [id number] Yes Take 1 tablet (10 mg total) by mouth 3 (three) times daily as needed for muscle spasms. Darnella Dorn SAUNDERS, MD  Active   docusate sodium  (COLACE) 100 MG capsule [id number] Yes Take 1 capsule (100 mg total) by mouth daily as needed (please take daily while on narcotic pain medication). Darnella Dorn SAUNDERS, MD  Active   flecainide  (TAMBOCOR ) 100 MG tablet [id number] Yes TAKE 1 TABLET BY MOUTH TWICE A DAY Camnitz, Will Gladis, MD  Active Self  fluticasone  (FLONASE ) 50 MCG/ACT nasal spray [id number] Yes Place 2 sprays into both nostrils daily. Rosario Leatrice FERNS, MD  Active Self  gabapentin  (NEURONTIN ) 300 MG capsule [id number] Yes Take one tablet in the morning, one tablet in the afternoon, and 2 tablets at bedtime Theophilus Andrews, Tully GRADE, MD  Active Self  hydrALAZINE  (APRESOLINE ) 25 MG tablet [id number] Yes TAKE 1 TABLET (25 MG TOTAL) BY MOUTH 4 (FOUR) TIMES DAILY.  Patient taking differently: Take 25 mg by mouth in the morning and at bedtime.   Krasowski, Robert J, MD  Active Self  montelukast  (SINGULAIR ) 10 MG tablet [id number] Yes Take 10 mg by mouth in the morning. [provider]  Active Self  oxyCODONE  (OXY IR/ROXICODONE ) 5 MG immediate release tablet [id number] Yes Take 1 tablet (5 mg total) by mouth every 6 (six) hours as needed for moderate pain (pain score 4-6). Darnella Dorn SAUNDERS, MD  Active   potassium chloride  SA (KLOR-CON  M20) 20 MEQ tablet [id number] Yes Take 1 tablet (20 mEq total) by mouth daily. Theophilus Andrews, Tully GRADE, MD  Active Self  RESTASIS  0.05 % ophthalmic emulsion [id number] Yes Place 1 drop into both eyes in the morning.  [provider]  Active Self  torsemide  (DEMADEX ) 20 MG tablet [id number] Yes Take 1 tablet (20 mg total) by mouth 2 (two) times daily. Bernie Lamar PARAS, MD  Active Self            Home Care and Equipment/Supplies: Were Home Health Services Ordered?: Yes Name of Home Health Agency:: [facility] Has Agency set up a time to come to your home?: Yes First Home Health Visit Date: 02/11/24 Any new equipment or medical supplies ordered?: No  Functional Questionnaire: Do you need assistance with bathing/showering or dressing?: No Do you need assistance with meal preparation?: No Do you need assistance with eating?: No Do you have difficulty maintaining continence: No Do you need assistance with getting out of bed/getting out of a chair/moving?: Yes (using a rollator) Do you have difficulty managing or taking your medications?: No  Follow up appointments reviewed: PCP Follow-up appointment confirmed?: No (will call today) MD Provider Line Number:[phone] Given: No Specialist Hospital Follow-up appointment confirmed?: No Reason Specialist Follow-Up Not Confirmed: Patient has Specialist Provider Number and will Call for Appointment Do you need transportation to your follow-up appointment?: No Do you understand care options if your condition(s) worsen?: Yes-patient verbalized understanding   Placed call to patient for TOC. Reviewed discharge medications, activity restrictions, wound care with patient according to AVS.  Encouraged patient to schedule hospital follow up with PCP and surgeon.  Confirmed patient has contact information. Reviewed signs of infection and when to call MD. Patient reports 1 fall since he has been home from hospital. Suggested patient discuss with MD.  I will route this note  to PCP and Neurosurgeon regarding falls.  Home health opened case today.   Reviewed fall prevention and use of rollator. Reviewed and offered 30 day TOC program and patient  declined.    [name] Ee, RN, BSN, CEN [employer]- Transition of Care Team.  [facility] [phone]

## 2024-02-21 ENCOUNTER — Telehealth: Payer: Self-pay | Admitting: *Deleted

## 2024-02-21 ENCOUNTER — Ambulatory Visit: Payer: Self-pay

## 2024-02-21 NOTE — Telephone Encounter (Signed)
 Left a detailed message at the patient's home number stating he should seek treatment immediately at the ER due to the symptoms below.

## 2024-02-21 NOTE — Telephone Encounter (Signed)
 Pt wanting to reschedule appt.  Pt was on the phone with Paige (PAS) and disconnected while holding. This RN attempted to call the pt back at [phone]. No answer, LVM and instructed pt to call back at [phone]. Placed in callbacks for follow up.

## 2024-02-21 NOTE — Telephone Encounter (Signed)
 FYI Only or Action Required?: FYI only for provider: appointment scheduled on 12/30.  Patient was last seen in primary care on 01/22/2024 by Charles Lloyd LABOR, MD.  Called Nurse Triage reporting Fall.  Symptoms began several weeks ago.  Interventions attempted: Prescription medications: hydro, Rest, hydration, or home remedies, and Ice/heat application.  Symptoms are: gradually worsening.  Triage Disposition: See PCP When Office is Open (Within 3 Days)  Patient/caregiver understands and will follow disposition?: No, wishes to speak with PCP  Copied from CRM #[id number]. Topic: Clinical - Red Word Triage >> Feb 21, 2024  9:34 AM [name] wrote: [name] that prompted transfer to Nurse Triage: Patient fell two days ago due to having severe pain in both legs pain is 8 Reason for Disposition  MILD weakness (e.g., does not interfere with ability to work, go to school, normal activities)  (Exception: Mild weakness is a chronic symptom.)  Answer Assessment - Initial Assessment Questions Full body weight fell onto his bottom- 8 weeks ago- sciatic pain, ortho, neurosurgery referrals MRI and CT scan.  Thursday just had spine surgery- back pain has resolved Got up getting out of bed last night due to leg pain. Described as feeling tazered. This is not new, just ongoing. Patient denies hot hard lump, fever, CP, SOB, dizziness, swelling, unilateral weakness or paresthesias.   Neurosurgeon advised he reach out to PCP regarding being seen for new referral to possibly Neurology. CAL [name] contacted for sooner OV- found 12/30 with PCP to discuss  Called surgeon- back pain is gone- advised  1. MECHANISM: How did the fall happen?     Fell walking into his kitchen with  2. DOMESTIC VIOLENCE AND ELDER ABUSE SCREENING: Did you fall because someone pushed you or tried to hurt you? If Yes, ask: Are you safe now?     denies 3. ONSET: When did the fall happen? (e.g., minutes, hours, or days ago)      Last night  4. LOCATION: What part of the body hit the ground? (e.g., back, buttocks, head, hips, knees, hands, head, stomach)     Bottom  5. INJURY: Did you hurt (injure) yourself when you fell? If Yes, ask: What did you injure? Tell me more about this? (e.g., body area; type of injury; pain severity)     Denies 6. PAIN: Is there any pain? If Yes, ask: How bad is the pain? (e.g., Scale 0-10; or none, mild,      8-9/10 7. SIZE: For cuts, bruises, or swelling, ask: How large is it? (e.g., inches or centimeters)      denies 9. OTHER SYMPTOMS: Do you have any other symptoms? (e.g., dizziness, fever, weakness; new-onset or worsening).      Denies  10. CAUSE: What do you think caused the fall (or falling)? (e.g., dizzy spell, tripped)       Pain in lower extremeties  Protocols used: Falls and [facility]

## 2024-02-21 NOTE — Telephone Encounter (Signed)
 This RN notes patient has been in communication with clinic CMA (see prior notes and ED disposition recommendation).  No need to call back at this time as situation addressed.  Removed from call back basket.

## 2024-02-21 NOTE — Telephone Encounter (Signed)
 Copied from CRM #[id number]. Topic: Clinical - Medical Advice >> Feb 21, 2024 11:15 AM Burnard DEL wrote: Reason for CRM: Patient returned call to [facility].Patient stated that he's not going to go to the ED. He stated that he will just come in for his appointment next week.

## 2024-02-22 ENCOUNTER — Emergency Department (HOSPITAL_COMMUNITY)

## 2024-02-22 ENCOUNTER — Encounter (HOSPITAL_COMMUNITY): Payer: Self-pay | Admitting: Family Medicine

## 2024-02-22 ENCOUNTER — Inpatient Hospital Stay (HOSPITAL_COMMUNITY)
Admission: EM | Admit: 2024-02-22 | Discharge: 2024-03-31 | DRG: 862 | Disposition: A | Discharge status: Skilled Nursing Facility | Attending: Family Medicine | Admitting: Family Medicine

## 2024-02-22 ENCOUNTER — Other Ambulatory Visit: Payer: Self-pay

## 2024-02-22 DIAGNOSIS — Y93E1 Activity, personal bathing and showering: Secondary | ICD-10-CM

## 2024-02-22 DIAGNOSIS — R4701 Aphasia: Secondary | ICD-10-CM | POA: Diagnosis present

## 2024-02-22 DIAGNOSIS — G629 Polyneuropathy, unspecified: Secondary | ICD-10-CM

## 2024-02-22 DIAGNOSIS — G9341 Metabolic encephalopathy: Secondary | ICD-10-CM | POA: Diagnosis not present

## 2024-02-22 DIAGNOSIS — T8144XA Sepsis following a procedure, initial encounter: Secondary | ICD-10-CM | POA: Diagnosis present

## 2024-02-22 DIAGNOSIS — E440 Moderate protein-calorie malnutrition: Secondary | ICD-10-CM | POA: Insufficient documentation

## 2024-02-22 DIAGNOSIS — Z981 Arthrodesis status: Secondary | ICD-10-CM

## 2024-02-22 DIAGNOSIS — E8721 Acute metabolic acidosis: Secondary | ICD-10-CM | POA: Diagnosis not present

## 2024-02-22 DIAGNOSIS — K5900 Constipation, unspecified: Secondary | ICD-10-CM | POA: Diagnosis present

## 2024-02-22 DIAGNOSIS — F430 Acute stress reaction: Secondary | ICD-10-CM | POA: Diagnosis not present

## 2024-02-22 DIAGNOSIS — F03918 Unspecified dementia, unspecified severity, with other behavioral disturbance: Secondary | ICD-10-CM | POA: Diagnosis present

## 2024-02-22 DIAGNOSIS — F0393 Unspecified dementia, unspecified severity, with mood disturbance: Secondary | ICD-10-CM | POA: Diagnosis present

## 2024-02-22 DIAGNOSIS — Z6826 Body mass index (BMI) 26.0-26.9, adult: Secondary | ICD-10-CM

## 2024-02-22 DIAGNOSIS — J45909 Unspecified asthma, uncomplicated: Secondary | ICD-10-CM | POA: Diagnosis present

## 2024-02-22 DIAGNOSIS — Z96653 Presence of artificial knee joint, bilateral: Secondary | ICD-10-CM | POA: Diagnosis present

## 2024-02-22 DIAGNOSIS — Z7901 Long term (current) use of anticoagulants: Secondary | ICD-10-CM

## 2024-02-22 DIAGNOSIS — E876 Hypokalemia: Secondary | ICD-10-CM | POA: Diagnosis present

## 2024-02-22 DIAGNOSIS — L8990 Pressure ulcer of unspecified site, unspecified stage: Secondary | ICD-10-CM | POA: Diagnosis present

## 2024-02-22 DIAGNOSIS — E1165 Type 2 diabetes mellitus with hyperglycemia: Secondary | ICD-10-CM | POA: Diagnosis present

## 2024-02-22 DIAGNOSIS — R296 Repeated falls: Secondary | ICD-10-CM | POA: Diagnosis present

## 2024-02-22 DIAGNOSIS — E1142 Type 2 diabetes mellitus with diabetic polyneuropathy: Secondary | ICD-10-CM | POA: Diagnosis present

## 2024-02-22 DIAGNOSIS — F05 Delirium due to known physiological condition: Secondary | ICD-10-CM | POA: Diagnosis present

## 2024-02-22 DIAGNOSIS — I5032 Chronic diastolic (congestive) heart failure: Secondary | ICD-10-CM | POA: Diagnosis not present

## 2024-02-22 DIAGNOSIS — R4182 Altered mental status, unspecified: Secondary | ICD-10-CM

## 2024-02-22 DIAGNOSIS — I10 Essential (primary) hypertension: Secondary | ICD-10-CM | POA: Diagnosis not present

## 2024-02-22 DIAGNOSIS — E8809 Other disorders of plasma-protein metabolism, not elsewhere classified: Secondary | ICD-10-CM | POA: Diagnosis present

## 2024-02-22 DIAGNOSIS — R339 Retention of urine, unspecified: Secondary | ICD-10-CM | POA: Diagnosis present

## 2024-02-22 DIAGNOSIS — W19XXXA Unspecified fall, initial encounter: Principal | ICD-10-CM

## 2024-02-22 DIAGNOSIS — K760 Fatty (change of) liver, not elsewhere classified: Secondary | ICD-10-CM | POA: Diagnosis present

## 2024-02-22 DIAGNOSIS — E7800 Pure hypercholesterolemia, unspecified: Secondary | ICD-10-CM | POA: Diagnosis not present

## 2024-02-22 DIAGNOSIS — K719 Toxic liver disease, unspecified: Secondary | ICD-10-CM | POA: Diagnosis not present

## 2024-02-22 DIAGNOSIS — Z888 Allergy status to other drugs, medicaments and biological substances status: Secondary | ICD-10-CM

## 2024-02-22 DIAGNOSIS — Z9049 Acquired absence of other specified parts of digestive tract: Secondary | ICD-10-CM

## 2024-02-22 DIAGNOSIS — I4510 Unspecified right bundle-branch block: Secondary | ICD-10-CM | POA: Diagnosis present

## 2024-02-22 DIAGNOSIS — G6289 Other specified polyneuropathies: Secondary | ICD-10-CM

## 2024-02-22 DIAGNOSIS — E860 Dehydration: Secondary | ICD-10-CM | POA: Diagnosis present

## 2024-02-22 DIAGNOSIS — I4819 Other persistent atrial fibrillation: Secondary | ICD-10-CM | POA: Diagnosis present

## 2024-02-22 DIAGNOSIS — M1A0411 Idiopathic chronic gout, right hand, with tophus (tophi): Secondary | ICD-10-CM

## 2024-02-22 DIAGNOSIS — F10231 Alcohol dependence with withdrawal delirium: Secondary | ICD-10-CM | POA: Diagnosis not present

## 2024-02-22 DIAGNOSIS — I480 Paroxysmal atrial fibrillation: Secondary | ICD-10-CM | POA: Diagnosis not present

## 2024-02-22 DIAGNOSIS — E7849 Other hyperlipidemia: Secondary | ICD-10-CM | POA: Diagnosis present

## 2024-02-22 DIAGNOSIS — Z8673 Personal history of transient ischemic attack (TIA), and cerebral infarction without residual deficits: Secondary | ICD-10-CM

## 2024-02-22 DIAGNOSIS — I2510 Atherosclerotic heart disease of native coronary artery without angina pectoris: Secondary | ICD-10-CM | POA: Diagnosis present

## 2024-02-22 DIAGNOSIS — Z818 Family history of other mental and behavioral disorders: Secondary | ICD-10-CM

## 2024-02-22 DIAGNOSIS — F0394 Unspecified dementia, unspecified severity, with anxiety: Secondary | ICD-10-CM | POA: Diagnosis present

## 2024-02-22 DIAGNOSIS — Z66 Do not resuscitate: Secondary | ICD-10-CM | POA: Diagnosis not present

## 2024-02-22 DIAGNOSIS — Z85828 Personal history of other malignant neoplasm of skin: Secondary | ICD-10-CM

## 2024-02-22 DIAGNOSIS — T8141XA Infection following a procedure, superficial incisional surgical site, initial encounter: Principal | ICD-10-CM | POA: Diagnosis present

## 2024-02-22 DIAGNOSIS — B179 Acute viral hepatitis, unspecified: Secondary | ICD-10-CM | POA: Diagnosis present

## 2024-02-22 DIAGNOSIS — S0990XA Unspecified injury of head, initial encounter: Secondary | ICD-10-CM | POA: Diagnosis present

## 2024-02-22 DIAGNOSIS — K6812 Psoas muscle abscess: Secondary | ICD-10-CM | POA: Diagnosis not present

## 2024-02-22 DIAGNOSIS — I110 Hypertensive heart disease with heart failure: Secondary | ICD-10-CM | POA: Diagnosis present

## 2024-02-22 DIAGNOSIS — L89326 Pressure-induced deep tissue damage of left buttock: Secondary | ICD-10-CM | POA: Diagnosis present

## 2024-02-22 DIAGNOSIS — Z923 Personal history of irradiation: Secondary | ICD-10-CM

## 2024-02-22 DIAGNOSIS — I959 Hypotension, unspecified: Secondary | ICD-10-CM | POA: Diagnosis not present

## 2024-02-22 DIAGNOSIS — Y92009 Unspecified place in unspecified non-institutional (private) residence as the place of occurrence of the external cause: Principal | ICD-10-CM

## 2024-02-22 DIAGNOSIS — G253 Myoclonus: Secondary | ICD-10-CM | POA: Diagnosis present

## 2024-02-22 DIAGNOSIS — E870 Hyperosmolality and hypernatremia: Secondary | ICD-10-CM | POA: Diagnosis not present

## 2024-02-22 DIAGNOSIS — L89896 Pressure-induced deep tissue damage of other site: Secondary | ICD-10-CM | POA: Diagnosis present

## 2024-02-22 DIAGNOSIS — L89316 Pressure-induced deep tissue damage of right buttock: Secondary | ICD-10-CM | POA: Diagnosis present

## 2024-02-22 DIAGNOSIS — R410 Disorientation, unspecified: Secondary | ICD-10-CM

## 2024-02-22 DIAGNOSIS — E871 Hypo-osmolality and hyponatremia: Secondary | ICD-10-CM | POA: Diagnosis present

## 2024-02-22 DIAGNOSIS — E861 Hypovolemia: Secondary | ICD-10-CM | POA: Diagnosis present

## 2024-02-22 DIAGNOSIS — F10931 Alcohol use, unspecified with withdrawal delirium: Secondary | ICD-10-CM

## 2024-02-22 DIAGNOSIS — Z860100 Personal history of colon polyps, unspecified: Secondary | ICD-10-CM

## 2024-02-22 DIAGNOSIS — G4700 Insomnia, unspecified: Secondary | ICD-10-CM | POA: Diagnosis present

## 2024-02-22 DIAGNOSIS — E1169 Type 2 diabetes mellitus with other specified complication: Secondary | ICD-10-CM | POA: Diagnosis present

## 2024-02-22 DIAGNOSIS — W182XXA Fall in (into) shower or empty bathtub, initial encounter: Secondary | ICD-10-CM | POA: Diagnosis present

## 2024-02-22 DIAGNOSIS — J4530 Mild persistent asthma, uncomplicated: Secondary | ICD-10-CM

## 2024-02-22 DIAGNOSIS — N179 Acute kidney failure, unspecified: Secondary | ICD-10-CM | POA: Diagnosis present

## 2024-02-22 DIAGNOSIS — R0602 Shortness of breath: Secondary | ICD-10-CM

## 2024-02-22 DIAGNOSIS — F32A Depression, unspecified: Secondary | ICD-10-CM | POA: Diagnosis present

## 2024-02-22 DIAGNOSIS — D638 Anemia in other chronic diseases classified elsewhere: Secondary | ICD-10-CM | POA: Diagnosis present

## 2024-02-22 DIAGNOSIS — Z515 Encounter for palliative care: Secondary | ICD-10-CM

## 2024-02-22 DIAGNOSIS — D696 Thrombocytopenia, unspecified: Secondary | ICD-10-CM | POA: Diagnosis not present

## 2024-02-22 DIAGNOSIS — F439 Reaction to severe stress, unspecified: Secondary | ICD-10-CM | POA: Diagnosis present

## 2024-02-22 DIAGNOSIS — A419 Sepsis, unspecified organism: Secondary | ICD-10-CM | POA: Diagnosis present

## 2024-02-22 DIAGNOSIS — Z751 Person awaiting admission to adequate facility elsewhere: Secondary | ICD-10-CM

## 2024-02-22 LAB — URINALYSIS, ROUTINE W REFLEX MICROSCOPIC
Bacteria, UA: NONE SEEN
Bilirubin Urine: NEGATIVE
Glucose, UA: NEGATIVE mg/dL
Hgb urine dipstick: NEGATIVE
Ketones, ur: NEGATIVE mg/dL
Leukocytes,Ua: NEGATIVE
Nitrite: NEGATIVE
Protein, ur: 30 mg/dL — AB
Specific Gravity, Urine: 1.023 (ref 1.005–1.030)
pH: 5 (ref 5.0–8.0)

## 2024-02-22 LAB — CBC WITH DIFFERENTIAL/PLATELET
Abs Immature Granulocytes: 0.01 K/uL (ref 0.00–0.07)
Basophils Absolute: 0 K/uL (ref 0.0–0.1)
Basophils Relative: 0 %
Eosinophils Absolute: 0.3 K/uL (ref 0.0–0.5)
Eosinophils Relative: 7 %
HCT: 32.4 % — ABNORMAL LOW (ref 39.0–52.0)
Hemoglobin: 10.7 g/dL — ABNORMAL LOW (ref 13.0–17.0)
Immature Granulocytes: 0 %
Lymphocytes Relative: 28 %
Lymphs Abs: 1.1 K/uL (ref 0.7–4.0)
MCH: 29.5 pg (ref 26.0–34.0)
MCHC: 33 g/dL (ref 30.0–36.0)
MCV: 89.3 fL (ref 80.0–100.0)
Monocytes Absolute: 0.6 K/uL (ref 0.1–1.0)
Monocytes Relative: 13 %
Neutro Abs: 2.1 K/uL (ref 1.7–7.7)
Neutrophils Relative %: 52 %
Platelets: 289 K/uL (ref 150–400)
RBC: 3.63 MIL/uL — ABNORMAL LOW (ref 4.22–5.81)
RDW: 16.9 % — ABNORMAL HIGH (ref 11.5–15.5)
WBC: 4.1 K/uL (ref 4.0–10.5)
nRBC: 0 % (ref 0.0–0.2)

## 2024-02-22 LAB — COMPREHENSIVE METABOLIC PANEL WITH GFR
ALT: 17 U/L (ref 0–44)
AST: 33 U/L (ref 15–41)
Albumin: 3.4 g/dL — ABNORMAL LOW (ref 3.5–5.0)
Alkaline Phosphatase: 128 U/L — ABNORMAL HIGH (ref 38–126)
Anion gap: 12 (ref 5–15)
BUN: 14 mg/dL (ref 8–23)
CO2: 23 mmol/L (ref 22–32)
Calcium: 9.2 mg/dL (ref 8.9–10.3)
Chloride: 99 mmol/L (ref 98–111)
Creatinine, Ser: 0.98 mg/dL (ref 0.61–1.24)
GFR, Estimated: >60 mL/min
Glucose, Bld: 172 mg/dL — ABNORMAL HIGH (ref 70–99)
Potassium: 3.4 mmol/L — ABNORMAL LOW (ref 3.5–5.1)
Sodium: 134 mmol/L — ABNORMAL LOW (ref 135–145)
Total Bilirubin: 1 mg/dL (ref 0.0–1.2)
Total Protein: 5.7 g/dL — ABNORMAL LOW (ref 6.5–8.1)

## 2024-02-22 LAB — TROPONIN T, HIGH SENSITIVITY
Troponin T High Sensitivity: 82 ng/L — ABNORMAL HIGH (ref 0–19)
Troponin T High Sensitivity: 73 ng/L — ABNORMAL HIGH (ref 0–19)

## 2024-02-22 LAB — MAGNESIUM: Magnesium: 1.8 mg/dL (ref 1.7–2.4)

## 2024-02-22 MED ORDER — LORAZEPAM 0.5 MG PO TABS
0.5000 mg | ORAL_TABLET | Freq: Once | ORAL | Status: AC
  Administered 2024-02-22: 0.5 mg via ORAL
  Filled 2024-02-22: qty 1

## 2024-02-22 MED ORDER — LORAZEPAM 0.5 MG PO TABS
0.5000 mg | ORAL_TABLET | Freq: Once | ORAL | Status: DC
  Filled 2024-02-22: qty 1

## 2024-02-22 MED ORDER — PIPERACILLIN-TAZOBACTAM 3.375 G IVPB
3.3750 g | Freq: Three times a day (TID) | INTRAVENOUS | Status: DC
  Administered 2024-02-23 – 2024-02-28 (×16): 3.375 g via INTRAVENOUS
  Filled 2024-02-22 (×2): qty 50

## 2024-02-22 MED ORDER — VANCOMYCIN HCL 1500 MG/300ML IV SOLN: 1500.0000 mg | INTRAVENOUS | Status: DC

## 2024-02-22 MED ORDER — OXYCODONE HCL 5 MG PO TABS
5.0000 mg | ORAL_TABLET | Freq: Once | ORAL | Status: AC
  Administered 2024-02-22: 5 mg via ORAL
  Filled 2024-02-22: qty 1

## 2024-02-22 MED ORDER — PIPERACILLIN-TAZOBACTAM 3.375 G IVPB 30 MIN
3.3750 g | Freq: Once | INTRAVENOUS | Status: AC
  Administered 2024-02-22: 3.375 g via INTRAVENOUS
  Filled 2024-02-22: qty 50

## 2024-02-22 MED ORDER — ACETAMINOPHEN 500 MG PO TABS
1000.0000 mg | ORAL_TABLET | Freq: Once | ORAL | Status: AC
  Administered 2024-02-22: 1000 mg via ORAL
  Filled 2024-02-22: qty 2

## 2024-02-22 MED ORDER — GADOBUTROL 1 MMOL/ML IV SOLN
8.0000 mL | Freq: Once | INTRAVENOUS | Status: AC | PRN
  Administered 2024-02-22: 8 mL via INTRAVENOUS

## 2024-02-22 MED ORDER — VANCOMYCIN HCL 1500 MG/300ML IV SOLN
1500.0000 mg | Freq: Once | INTRAVENOUS | Status: AC
  Administered 2024-02-22: 1500 mg via INTRAVENOUS
  Filled 2024-02-22: qty 300

## 2024-02-22 NOTE — ED Provider Notes (Signed)
 " [place] EMERGENCY DEPARTMENT AT [facility] Provider Note   CSN: [id number] Arrival date & time: 02/22/24  1213     History {Add pertinent medical, surgical, social history, OB history to HPI:1} Chief Complaint  Patient presents with   Charles Lloyd. is a 83 y.o. male with PMH as listed below who presents with head injury.  Charles Lloyd and hit his head in the shower.  On Eliquis .  Patient's son and daughter-in-law at bedside report recurrent falls since his surgery.  Per chart review he had lumbar fusion with Dr. 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  He states that he has been doing poorly since that time.  He is falling very frequently and fell today in the shower, striking his head.  He did not lose consciousness.  Denies any unilateral weakness, speech changes, numbness/tingling but he states that his legs feel very weak and he is having severe pain in his bilateral buttocks hips and down his legs, particularly on the left side.  No fevers chills, cough, shortness of breath, chest pain, neck pain, flulike symptoms.  Has had frequent falls even over the past 6 to 8 weeks per family at bedside.   Past Medical History:  Diagnosis Date   Acute tracheobronchitis 01/05/2019   B12 deficiency    Basal cell carcinoma (BCC) of left side of nose 01/28/2018   Cancer (HCC)    Cardiomyopathy (HCC) 03/10/2018   With chronic atrial fibrillation   CHF (congestive heart failure) (HCC) 04/16/2013   Functional class II, ejection fraction 35-40%  Formatting of this note might be different from the original. Functional class II, ejection fraction 35-40%   Chronic anticoagulation    Chronic diastolic (congestive) heart failure (HCC) 04/16/2013   Functional class II, ejection fraction 35-40%  Last Assessment & Plan:  Clinically stable Volume well controlled meds reviewed   Chronic diastolic CHF (congestive heart failure) (HCC) 04/16/2013   Functional class II,  ejection fraction 35-40%  Last Assessment & Plan:  Clinically stable Volume well controlled meds reviewed   Colon polyps 04/16/2013   Coronary artery disease involving native coronary artery of native heart without angina pectoris 04/05/2022   Myoview  05/25/2019: EF 60, normal perfusion, low risk CCTA (AF protocol) 04/25/20: CAC score 4242 (99th percentile)   Diabetic neuropathy (HCC)    Difficult intubation    pt reports difficult intubation during surgery in [place] in 2015   Dysrhythmia    A.Fib   Eosinophil count raised 02/17/2019   Essential hypertension 01/06/2010   Last Assessment & Plan:  Well controlled Continue med management   Gout    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  Steroid usage was discontinued several months ago.  Follow up with Dermatology as planned.   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   Last Assessment & Plan:  Id following ?ongoing abx managementy reported Request records   Infection of prosthetic right knee joint    Insomnia 03/04/2012   Grief with loss of wife 02/20/12  Last Assessment & Plan:  Improved sx  contineu xanax prn Se discussed   Lesion of liver 04/20/2019   -  On cardiac CT 02/2019   Mild reactive airways disease    Neoplasm of prostate 04/16/2013   Neoplasm of prostate, 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 Transthoracic Echocardiogram-03/10/2013-[facility]: Normal left ventricular wall thickness and cavity size.  Global left ventricular systolic function is moderately reduced.  The estimated ejection fraction is 35-40%.  The left atrium is moderately enlarged.  The right atrium is mildly enlarged.  No significant valvular    Overweight (BMI 25.0-29.9) 10/22/2016   Persistent atrial fibrillation (HCC) 04/16/2013   Last Assessment & Plan:  Rate controlled Continue med management eliquis  for stroke preventino  Formatting of this note might be different from the original.  Drug  HX Current Rx Pre-ABL inefficacy Pre-ABL intolerant Post-ABL inefficacy Post-ABL intolerant max dose/24h M/Y end comments  sotalol                  dofetilide                  flecainide                   propafenone                  am   S/P TKR (total knee replacement), bilateral    Secondary hypercoagulable state 04/09/2019   Tinea cruris 04/16/2013   Type 2 diabetes mellitus with diabetic polyneuropathy, without long-term current use of insulin  (HCC) 04/16/2013   Last Assessment & Plan:  Labs today Pt reports well controlled on ambulatory monitoring   Vitamin D  deficiency 07/25/2018       Home Medications Prior to Admission medications  Medication Sig Start Date End Date Taking? Authorizing Provider  ACCU-CHEK AVIVA PLUS test strip 1 EACH BY OTHER ROUTE DAILY. DX E11.9 08/28/21   Charles Lloyd, Charles GRADE, MD  allopurinol  (ZYLOPRIM ) 300 MG tablet TAKE 1 TABLET BY MOUTH EVERY DAY 01/13/24   Charles Lloyd, Charles GRADE, MD  apixaban  (ELIQUIS ) 5 MG TABS tablet Take 1 tablet (5 mg total) by mouth 2 (two) times daily. Patient not taking: Reported on 02/11/2024 08/02/23   Charles Lloyd, Charles GRADE, MD  atorvastatin  (LIPITOR) 20 MG tablet TAKE 1 TABLET DAILY (PLEASE SCHEDULE A PHYSICAL FOR MORE REFILLS) 12/09/23   Charles Lloyd, Charles POUR, MD  carvedilol   (COREG ) 3.125 MG tablet TAKE 1 TABLET BY MOUTH TWICE A DAY 12/31/23   Charles Lloyd, Charles GRADE, MD  Continuous Blood Gluc Receiver (FREESTYLE LIBRE 2 READER) DEVI 1 each by Does not apply route daily. 08/28/21   Charles Lloyd, Charles GRADE, MD  Continuous Blood Gluc Sensor (FREESTYLE LIBRE 2 SENSOR) MISC 1 each by Does not apply route daily. 08/28/21   Charles Lloyd, Charles GRADE, MD  cyclobenzaprine  (FLEXERIL ) 10 MG tablet Take 1 tablet (10 mg total) by mouth 3 (three) times daily as needed for muscle spasms. 02/07/24   Charles Lloyd Dorn SAUNDERS, MD  docusate sodium  (COLACE) 100 MG capsule Take 1 capsule (100 mg total) by mouth daily as needed (please take daily while on narcotic pain medication). 02/07/24 02/06/25  Charles Lloyd Dorn SAUNDERS, MD  flecainide  (TAMBOCOR ) 100 MG tablet TAKE 1 TABLET BY MOUTH TWICE A DAY 10/24/23   Camnitz, Soyla Lunger, MD  fluticasone  (FLONASE ) 50 MCG/ACT nasal spray Place 2 sprays into both nostrils daily. 01/07/19   Rosario Leatrice FERNS, MD  gabapentin  (NEURONTIN ) 300 MG capsule Take one tablet in the morning, one tablet in the afternoon, and 2 tablets at bedtime 12/12/23   Charles Lloyd, Charles GRADE, MD  hydrALAZINE  (APRESOLINE ) 25 MG tablet TAKE 1 TABLET (25 MG TOTAL) BY MOUTH 4 (FOUR) TIMES DAILY. Patient taking differently: Take 25 mg by mouth in the morning and at bedtime. 10/22/23   Krasowski, Robert J, MD  montelukast  (SINGULAIR ) 10 MG tablet Take 10 mg by mouth in the morning.    [provider]  oxyCODONE  (OXY IR/ROXICODONE ) 5 MG immediate release tablet Take 1 tablet (5 mg total) by mouth every 6 (six) hours as needed for moderate pain (pain score 4-6). 02/07/24   Charles Lloyd Dorn SAUNDERS, MD  potassium chloride  SA (KLOR-CON  M20) 20 MEQ tablet Take 1 tablet (20 mEq total) by mouth daily. 09/18/23   Charles Lloyd, Charles GRADE, MD  RESTASIS  0.05 % ophthalmic emulsion Place 1 drop into both eyes in the morning. 12/09/19   [provider]  torsemide  (DEMADEX ) 20 MG tablet Take  1 tablet (20 mg total) by mouth 2 (two) times daily. 05/03/23   Krasowski, Robert J, MD      Allergies    Amlodipine  and Chlorhexidine  gluconate    Review of Systems   Review of Systems A 10 point review of systems was performed and is negative unless otherwise reported in HPI.  Physical Exam Updated Vital Signs BP (!) 161/73   Pulse 75   Temp 98.3 F (36.8 C) (Oral)   Resp 18   Ht 5' 8 (1.727 m)   Wt 79.4 kg   SpO2 98%   BMI 26.61 kg/m  Physical Exam General: Uncomfortable appearing male, lying in bed.  HEENT: PERRLA, EOMI, NCAT, Sclera anicteric, MMM, trachea midline.  Cardiology: RRR, no murmurs/rubs/gallops. BL radial and DP pulses equal bilaterally.  Resp: Normal respiratory rate and effort. CTAB, no wheezes, rhonchi, crackles.  Abd: Soft, non-tender, non-distended. No rebound tenderness or guarding.  GU: Deferred. MSK: No peripheral edema or signs of trauma. Skin: warm, dry. Back: No CVA tenderness.  No midline C or T-spine tenderness outpatient. + Midline L-spine tenderness outpatient with well-healing surgical incisions noted, no surrounding erythema, induration, fluctuance. + Left paraspinal and left buttock tenderness palpation as well. Neuro: A&Ox4, CNs II-XII grossly intact. 4/5 strength in RLE, 3/5 strength in LLE, 5/5 strength in BL UEs. Sensation grossly intact.  Psych: Normal mood and affect.   ED Results / Procedures / Treatments   Labs (all labs ordered are listed, but only abnormal results are displayed) Labs Reviewed  CBC WITH DIFFERENTIAL/PLATELET - Abnormal; Notable for the following components:      Result Value   RBC 3.63 (*)    Hemoglobin 10.7 (*)    HCT 32.4 (*)    RDW 16.9 (*)    All other components within normal limits  COMPREHENSIVE METABOLIC PANEL WITH GFR - Abnormal; Notable for the following components:   Sodium 134 (*)    Potassium 3.4 (*)    Glucose, Bld 172 (*)    Total Protein 5.7 (*)    Albumin  3.4 (*)    Alkaline Phosphatase  128 (*)    All other components within normal limits  URINALYSIS, ROUTINE W REFLEX MICROSCOPIC - Abnormal; Notable for the following components:   Color, Urine AMBER (*)    Protein, ur 30 (*)  All other components within normal limits  TROPONIN T, HIGH SENSITIVITY - Abnormal; Notable for the following components:   Troponin T High Sensitivity 82 (*)    All other components within normal limits  TROPONIN T, HIGH SENSITIVITY - Abnormal; Notable for the following components:   Troponin T High Sensitivity 73 (*)    All other components within normal limits  CULTURE, BLOOD (ROUTINE X 2)  CULTURE, BLOOD (ROUTINE X 2)  MAGNESIUM     EKG EKG Interpretation Date/Time:  Saturday February 22 2024 13:22:54 EST Ventricular Rate:  75 PR Interval:    QRS Duration:  213 QT Interval:  525 QTC Calculation: 587 R Axis:   124  Text Interpretation: Normal sinus rhythm Right bundle branch block Baseline wander in lead(s) V2 V5 Confirmed by Franklyn Gills [phone]) on 02/22/2024 11:37:44 PM  Radiology MR Lumbar Spine W Wo Contrast Result Date: 02/22/2024 EXAM: MRI LUMBAR SPINE 02/22/2024 06:47:20 PM TECHNIQUE: Multiplanar multisequence MRI of the lumbar spine was performed without or with the administration of intravenous contrast. COMPARISON: MRI lumbar spine 10/30/2023 CLINICAL HISTORY: Low back pain, cauda equina syndrome suspected FINDINGS: SEGMENTATION: Transitional lumbosacral anatomy. The transitional lumbosacral vertebra is assumed to represent the S1 level. This conforms to the segmentation utilized on prior exams. BONES AND ALIGNMENT: Similar Lloyd 1 retrolisthesis of L2 on L3 and improved Lloyd 1 anterolisthesis of L4 on L5. Interval L4-L5 interbody fusion and PLIF. Hardware artifact limits assessment, but no obviouse bone marrow signal abnormality. SPINAL CORD: The conus terminates normally. SOFT TISSUES: New 3.5 x 2.4 cm fluid collection in the left psoas muscle at the L4-L5 level. More  illdefined edema in the posterior paraspinal soft tissues with small fluid pockets is more typical of postoperative change. L1-L2 Disc height loss with degenerative endplate signal changes. Disc bulge with mild to moderate canal stenosis. Patent foramina. L2-L3 Disc bulging and endplate spurring with ligamentum flavum thickening, resulting in progressive severe canal stenosis. Bilateral facet arthropathy with progressive moderate to severe left foraminal stenosis. Similar moderate right foraminal stenosis. L3-L4 Broad disc bulge with ligamentum flavum thickening and bilateral facet arthropathy. Similar versus slightly progressive moderate to severe canal stenosis. Similar moderate left foraminal stenosis. L4-L5 Interbody fusion and PLIF.  Persistent severe canal stenosis. L5-S1 Mild disc bulge with similar borderline mild foraminal stenosis. S1-S2 Transitional anatomy. No spinal canal stenosis or neural foraminal narrowing. IMPRESSION: 1. New 3.5 x 2.4 cm fluid collection in the left psoas muscle at the L4-L5 level with surrounding edema. The location is atypical for postoperative changes and is concerning for abscess. No obvious changes of discitis/osteomyelitis but hardware artifact limits assessment. Recommend correlation with infectious markers. 2. At L2-L3, progressive severe canal stenosis and moderate to severe left foraminal stenosis. Similar moderate right foraminal stenosis. 3. At L3-L4, similar versus slightly progressive moderate to severe canal stenosis. 4. Interval fusion at L4-L5 with improved alignment but persistent severe canal stenosis. 5. Transitional lumbosacral vertebra is assumed to represent the S1 level. This conforms to the segmentation utilized on prior exams. Electronically signed by: Gilmore Molt MD 02/22/2024 09:12 PM EST RP Workstation: [id number]   CT PELVIS WO CONTRAST Result Date: 02/22/2024 CLINICAL DATA:  Fall in shower. History of frequent falls over the past 6-8 weeks.  EXAM: CT PELVIS WITHOUT CONTRAST TECHNIQUE: Multidetector CT imaging of the pelvis was performed following the standard protocol without intravenous contrast. RADIATION DOSE REDUCTION: This exam was performed according to the departmental dose-optimization program which includes automated exposure control, adjustment of the mA and/or  kV according to patient size and/or use of iterative reconstruction technique. COMPARISON:  Pelvic radiographs dated 02/22/2024. CT pelvis dated 10/30/2023. FINDINGS: Bones/Joint/Cartilage No acute fracture or dislocation. Femoral heads are seated within the acetabulum. 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 Bilateral greater trochanteric enthesopathy. The sacroiliac joints and pubic symphysis are anatomically aligned with degenerative changes. Partially visualized postoperative changes of the lower lumbar spine. Soft tissue and Muscles Soft tissue swelling along the left posterolateral abdominal wall. No loculated fluid collection. Intrapelvic contents Prostate implant seeds. Descending and sigmoid colonic diverticulosis. Moderate vascular calcifications again noted. No enlarged lymph nodes identified in the field of view. IMPRESSION: 1. No acute osseous abnormality. 2. Soft tissue swelling along the left posterolateral abdominal wall. No fluid collection. 3. 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 Electronically Signed   By: Harrietta Sherry M.D.   On: 02/22/2024 16:13   CT Lumbar Spine Wo Contrast Result Date: 02/22/2024 EXAM: CT OF THE LUMBAR SPINE WITHOUT CONTRAST 02/22/2024 03:28:05 PM TECHNIQUE: CT of the lumbar spine was performed without the administration of intravenous contrast. Multiplanar reformatted images are provided for review. Automated exposure control, iterative reconstruction, and/or weight based adjustment of the mA/kV was utilized to reduce  the radiation dose to as low as reasonably achievable. COMPARISON: MRI and CT lumbar spine 10/30/2023. CLINICAL HISTORY: Back trauma, no prior imaging (Age >= 16y). FINDINGS: BONES AND ALIGNMENT: Normal vertebral body heights. No evidence of compression fracture or displaced fracture in the lumbar spine. Redemonstrated transitional anatomy at the lumbosacral junction with a partially lumbarized S1 vertebral body and a near full size disc at the S1-S2 level. Interval PLIF at L4-L5 with bilateral pedicle screws and vertical interconnecting rods. Hardware is intact. Interbody spacer at L4-L5. There is no osseous fusion across the L4-L5 disc space on the current study. Diffuse osteopenia. Degenerative changes of the sacroiliac joints. Alignment: Trace degenerative retrolisthesis of L1 on L2 and L2 on L3. Similar Lloyd 1 anterolisthesis of L4 on L5. DEGENERATIVE CHANGES: Disc space narrowing and vacuum disc phenomenon at multiple levels. Degenerative endplate osteophytes at multiple levels. Posterior osteophytes along with facet arthrosis at L2-L3 contributing to moderate to severe spinal canal stenosis. Additional disc bulge and facet arthrosis at L3-L4 resulting in at least moderate spinal canal stenosis, although streak artifact slightly limits evaluation. Streak artifact limits evaluation of the spinal canal at L4-L5; there is likely residual moderate to severe spinal canal stenosis at this level. SOFT TISSUES: Atherosclerosis of the abdominal aorta and branch vessels. Additional prominent atherosclerosis within the pelvis. Cholecystectomy clips. Diverticulosis of the partially visualized sigmoid colon. IMPRESSION: 1. No evidence of acute traumatic injury. 2. Interval PLIF at L4-L5. Hardware is intact. No osseous fusion across the L4-L5 disc space. 3. 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 4. At least moderate spinal canal stenosis at L3-L4 due to disc  bulge and facet arthrosis. 5. Likely residual moderate to severe spinal canal stenosis at L4-L5, evaluation limited by streak artifact. Electronically signed by: Donnice Mania MD 02/22/2024 04:04 PM EST RP Workstation: [id number]   DG Pelvis 1-2 Views Result Date: 02/22/2024 EXAM: 1 or 2 VIEW(S) XRAY OF THE PELVIS 02/22/2024 01:09:00 PM COMPARISON: None available. CLINICAL HISTORY: [id number] Fall [id number] FINDINGS: BONES AND JOINTS: No acute fracture. No malalignment. Lumbar spinal fusion hardware noted. Moderate bilateral hip degenerative changes. SOFT TISSUES: Surgical clips overlie the pelvis. Vascular calcifications noted. IMPRESSION: 1. No  evidence of acute traumatic injury. Electronically signed by: Morgane Naveau MD 02/22/2024 01:32 PM EST RP Workstation: [id number]   DG Chest 1 View Result Date: 02/22/2024 EXAM: 1 VIEW(S) XRAY OF THE CHEST 02/22/2024 01:09:00 PM COMPARISON: 10/19/2022 CLINICAL HISTORY: Fall FINDINGS: LUNGS AND PLEURA: No focal pulmonary opacity. No pleural effusion. No pneumothorax. HEART AND MEDIASTINUM: Calcified aorta. BONES AND SOFT TISSUES: Thoracic degenerative changes. No acute osseous abnormality. IMPRESSION: 1. No acute process. Electronically signed by: Morgane Naveau MD 02/22/2024 01:31 PM EST RP Workstation: [id number]   CT Cervical Spine Wo Contrast Result Date: 02/22/2024 EXAM: CT CERVICAL SPINE WITHOUT CONTRAST 02/22/2024 12:58:30 PM TECHNIQUE: CT of the cervical spine was performed without the administration of intravenous contrast. Multiplanar reformatted images are provided for review. Automated exposure control, iterative reconstruction, and/or weight based adjustment of the mA/kV was utilized to reduce the radiation dose to as low as reasonably achievable. COMPARISON: 10/30/2023 and 12/30/2020 CLINICAL HISTORY: Neck trauma (Age >= 65y) FINDINGS: BONES AND ALIGNMENT: No acute fracture or traumatic malalignment. There is straightening of the normal cervical lordosis.  There is 3 mm anterolisthesis of C7 on T1 which is increased by approximately 1 mm since the 2022 study. Additional 4 mm anterolisthesis of T1 on T2 which is increased by approximately 1.5 x 2 mm since the 2022 study. DEGENERATIVE CHANGES: There is moderate to severe disc space narrowing at multiple levels in the cervical spine most pronounced at C5-C6 and C6-C7 overall similar to prior. There is severe disc space narrowing at T1-T2 which has increased from prior. Degenerative endplate osteophytes at multiple levels. Disc osteophyte complexes at multiple levels without high Lloyd osseous spinal canal stenosis. There is facet arthrosis and uncovertebral hypertrophy at multiple levels. SOFT TISSUES: No prevertebral soft tissue swelling. IMPRESSION: 1. No acute cervical spine fracture. 2. 3 mm anterolisthesis of C7 on T1, increased from prior. 4 mm anterolisthesis of T1 on T2, increased since 2022. Findings likely related to increased degenerative changes. Electronically signed by: Donnice Mania MD 02/22/2024 01:21 PM EST RP Workstation: [id number]   CT Head Wo Contrast Result Date: 02/22/2024 EXAM: CT HEAD WITHOUT CONTRAST 02/22/2024 12:58:30 PM TECHNIQUE: CT of the head was performed without the administration of intravenous contrast. Automated exposure control, iterative reconstruction, and/or weight based adjustment of the mA/kV was utilized to reduce the radiation dose to as low as reasonably achievable. COMPARISON: 10/30/2023 CLINICAL HISTORY: Head trauma, minor (Age >= 65y) FINDINGS: BRAIN AND VENTRICLES: No acute hemorrhage. No evidence of acute infarct. No hydrocephalus. No extra-axial collection. No mass effect or midline shift. Patchy and confluent decreased attenuation throughout the deep and periventricular white matter of the cerebral hemispheres bilaterally, compatible with chronic microvascular ischemic disease. Cerebral ventricle sizes concordant with degree of cerebral volume loss. Atherosclerotic  calcifications within the cavernous internal carotid arteries. ORBITS: No acute abnormality. Left lens replacement. SINUSES: No acute abnormality. SOFT TISSUES AND SKULL: No acute soft tissue abnormality. No skull fracture. IMPRESSION: 1. No acute intracranial abnormality. Electronically signed by: Donnice Mania MD 02/22/2024 01:14 PM EST RP Workstation: [id number]    Procedures Procedures  {Document cardiac monitor, telemetry assessment procedure when appropriate:1}  Medications Ordered in ED Medications  oxyCODONE  (Oxy IR/ROXICODONE ) immediate release tablet 5 mg (has no administration in time range)  piperacillin -tazobactam (ZOSYN ) IVPB 3.375 g (has no administration in time range)  vancomycin  (VANCOREADY) IVPB 1500 mg/300 mL (has no administration in time range)  acetaminophen  (TYLENOL ) tablet 1,000 mg (1,000 mg Oral Given 02/22/24 1731)  oxyCODONE  (Oxy IR/ROXICODONE ) immediate release tablet  5 mg (5 mg Oral Given 02/22/24 1731)  LORazepam  (ATIVAN ) tablet 0.5 mg (0.5 mg Oral Given 02/22/24 1730)  gadobutrol  (GADAVIST ) 1 MMOL/ML injection 8 mL (8 mLs Intravenous Contrast Given 02/22/24 1847)    ED Course/ Medical Decision Making/ A&P                          Medical Decision Making Amount and/or Complexity of Data Reviewed Labs: ordered. Decision-making details documented in ED Course. Radiology: ordered. Decision-making details documented in ED Course.  Risk OTC drugs. Prescription drug management. Decision regarding hospitalization.    This patient presents to the ED for concern of recurrent falls, LBP, leg weakness, head strike, this involves an extensive number of treatment options, and is a complaint that carries with it a high risk of complications and morbidity.  I considered the following differential and admission for this acute, potentially life threatening condition.   MDM:    With recurrent falls and the lower back and pelvic and hip pain, especially in the setting  of recent lumbar fusion surgery, consider cauda equina, transverse myelitis, discitis, or other postoperative infection.  He does have weakness in bilateral lower extremities on exam noticed difficulty tell if it is pain related or true weakness.  He does have midline tenderness in the lumbar spine and consider a traumatic injury as well such as vertebral fracture.  Also consider other traumatic injury such as ICH, skull fracture.  Consider generalized weakness as a potential cause, such as UTI, anemia, ACS or arrhythmia.  EKG obtained on arrival demonstrates new atypical right bundle branch block.  I discussed it with Dr. Almetta as below.  Troponin is mildly elevated but relatively flat.  Patient is not experiencing any chest pain shortness of breath or anginal equivalent.  No significant electrolyte derangements, leukocytosis, fever, new anemia.  Low concern for sepsis.  No evidence of pneumonia.  No traumatic injury on CT head.  CT pelvis negative.  Will obtain MRI.  Clinical Course as of 02/22/24 2335  Sat Feb 22, 2024  1325 CT Head Wo Contrast 1. No acute intracranial abnormality. [HN]  1326 CT Cervical Spine Wo Contrast 1. No acute cervical spine fracture. 2. 3 mm anterolisthesis of C7 on T1, increased from prior. 4 mm anterolisthesis of T1 on T2, increased since 2022. Findings likely related to increased degenerative changes.   [HN]  1337 No CP/SOB. Will page STEMI doctor about EKG to discuss. [HN]  1337 DG Pelvis 1-2 Views 1. No evidence of acute traumatic injury. [HN]  1337 DG Chest 1 View 1. No acute process. [HN]  1337 WBC: 4.1 No leukocytosis  [HN]  1338 Hemoglobin(!): 10.7 Anemia c/w baseline [HN]  1356 Repaging STEMI physician [HN]  1411 STEMI physician not responding. Paging to [facility] unassigned Dr. Almetta. [HN]  1421 D/w Dr. Almetta who reviewed patient's current and past EKGs. Agrees that this is different from prior. Discussed patient's presentation and  condition. Likely atypical RBBB in s/o no chest pain/SOB or ACS-like symptoms. Will get troponin and f/u with Dr. Almetta. [HN]  1536 Troponin T High Sensitivity(!): 82 Mildly elevated will trend [HN]  1621 CT PELVIS WO CONTRAST 1. No acute osseous abnormality. 2. Soft tissue swelling along the left posterolateral abdominal wall. No fluid collection. 3. 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   [HN]  1622 CT Lumbar Spine Wo Contrast 1. No evidence of acute traumatic injury. 2. Interval PLIF at L4-L5. Hardware is intact. No  osseous fusion across the L4-L5 disc space. 3. 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 4. At least moderate spinal canal stenosis at L3-L4 due to disc bulge and facet arthrosis. 5. Likely residual moderate to severe spinal canal stenosis at L4-L5, evaluation limited by streak artifact.   [HN]  2148 Urinalysis, Routine w reflex microscopic -Urine, Clean Catch(!) Neg urine [HN]  2149 MR Lumbar Spine W Wo Contrast 1. New 3.5 x 2.4 cm fluid collection in the left psoas muscle at the L4-L5 level with surrounding edema. The location is atypical for postoperative changes and is concerning for abscess. No obvious changes of discitis/osteomyelitis but hardware artifact limits assessment. Recommend correlation with infectious markers. 2. At L2-L3, progressive severe canal stenosis and moderate to severe left foraminal stenosis. Similar moderate right foraminal stenosis. 3. At L3-L4, similar versus slightly progressive moderate to severe canal stenosis. 4. Interval fusion at L4-L5 with improved alignment but persistent severe canal stenosis. 5. Transitional lumbosacral vertebra is assumed to represent the S1 level. This conforms to the segmentation utilized on prior exams.   [HN]  2150 Consulting to neurosurgery [HN]  2301 D/w Dr. Colon. Patient w/ psoas abscess. Pt will need  hospitalist admission, broad spectrum antibiotics, and likely ID consult. Dr. Colon also recommended reaching out to IR in the AM to see if they can aspirate the abscess. Patient is not septic. Recommends admission to [facility].  [HN]    Clinical Course User Index [HN] Franklyn Sid SAILOR, MD    Labs: I Ordered, and personally interpreted labs.  The pertinent results include: Those listed above  Imaging Studies ordered: I ordered imaging studies including CT head, CT L-spine, chest x-ray, pelvis x-ray, MRI lumbar spine without contrast I independently visualized and interpreted imaging. I agree with the radiologist interpretation  Additional history obtained from chart review, family at bedside.    Cardiac Monitoring: The patient was maintained on a cardiac monitor.  I personally viewed and interpreted the cardiac monitored which showed an underlying rhythm of: Normal sinus rhythm  Reevaluation: After the interventions noted above, I reevaluated the patient and found that they have :stayed the same  Social Determinants of Health:  lives with family  Disposition: Admit to medicine with neurosurgery following  Co morbidities that complicate the patient evaluation  Past Medical History:  Diagnosis Date   Acute tracheobronchitis 01/05/2019   B12 deficiency    Basal cell carcinoma (BCC) of left side of nose 01/28/2018   Cancer (HCC)    Cardiomyopathy (HCC) 03/10/2018   With chronic atrial fibrillation   CHF (congestive heart failure) (HCC) 04/16/2013   Functional class II, ejection fraction 35-40%  Formatting of this note might be different from the original. Functional class II, ejection fraction 35-40%   Chronic anticoagulation    Chronic diastolic (congestive) heart failure (HCC) 04/16/2013   Functional class II, ejection fraction 35-40%  Last Assessment & Plan:  Clinically stable Volume well controlled meds reviewed   Chronic diastolic CHF (congestive heart failure)  (HCC) 04/16/2013   Functional class II, ejection fraction 35-40%  Last Assessment & Plan:  Clinically stable Volume well controlled meds reviewed   Colon polyps 04/16/2013   Coronary artery disease involving native coronary artery of native heart without angina pectoris 04/05/2022   Myoview  05/25/2019: EF 60, normal perfusion, low risk CCTA (AF protocol) 04/25/20: CAC score 4242 (99th percentile)   Diabetic neuropathy (HCC)    Difficult intubation    pt reports  difficult intubation during surgery in [place] in 2015   Dysrhythmia    A.Fib   Eosinophil count raised 02/17/2019   Essential hypertension 01/06/2010   Last Assessment & Plan:  Well controlled Continue med management   Gout    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  Steroid usage was discontinued several months ago.  Follow up with Dermatology as planned.   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   Last Assessment & Plan:  Id following ?ongoing abx managementy reported Request records   Infection of prosthetic right knee joint    Insomnia 03/04/2012   Grief with loss of wife 02/20/12  Last Assessment & Plan:  Improved sx  contineu xanax prn Se discussed   Lesion of liver 04/20/2019   -On cardiac CT 02/2019   Mild reactive airways disease    Neoplasm of prostate 04/16/2013   Neoplasm of prostate, 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 Transthoracic Echocardiogram-03/10/2013-[facility]: Normal left ventricular wall thickness and cavity size.  Global left ventricular systolic function is moderately reduced.  The estimated ejection fraction is 35-40%.  The left atrium is moderately enlarged.  The right atrium is mildly enlarged.  No significant valvular    Overweight (BMI 25.0-29.9) 10/22/2016   Persistent atrial fibrillation (HCC) 04/16/2013   Last Assessment & Plan:  Rate controlled Continue med management eliquis  for stroke preventino  Formatting of this note might be different from the original.  Drug  HX Current Rx Pre-ABL inefficacy Pre-ABL intolerant Post-ABL inefficacy Post-ABL intolerant max dose/24h M/Y end comments  sotalol                  dofetilide                  flecainide                   propafenone                  am   S/P TKR (total knee replacement), bilateral    Secondary hypercoagulable state 04/09/2019   Tinea cruris 04/16/2013   Type 2 diabetes mellitus with diabetic polyneuropathy, without long-term current use of insulin  (HCC) 04/16/2013   Last Assessment & Plan:  Labs today Pt reports well controlled on ambulatory monitoring   Vitamin D  deficiency 07/25/2018     Medicines Meds ordered this encounter  Medications   acetaminophen  (TYLENOL ) tablet 1,000 mg   oxyCODONE  (Oxy IR/ROXICODONE ) immediate release tablet 5 mg    Refill:  0   DISCONTD: LORazepam  (ATIVAN ) tablet 0.5 mg   LORazepam  (ATIVAN ) tablet 0.5 mg   gadobutrol  (GADAVIST ) 1 MMOL/ML injection 8 mL   oxyCODONE  (Oxy IR/ROXICODONE ) immediate release tablet 5 mg    Refill:  0   piperacillin -tazobactam (ZOSYN ) IVPB 3.375 g    Antibiotic Indication::   Wound Infection  vancomycin  (VANCOREADY) IVPB 1500 mg/300 mL    Indication::   Wound Infection    I have reviewed the patients home medicines and have made  adjustments as needed  Problem List / ED Course: Problem List Items Addressed This Visit       Musculoskeletal and Integument   * (Principal) Psoas abscess, left (HCC)   Other Visit Diagnoses       Fall in home, initial encounter    -  Primary            {Document critical care time when appropriate:1} {Document review of labs and clinical decision tools ie heart score, Chads2Vasc2 etc:1}  {Document your independent review of radiology images, and any outside records:1} {Document your discussion with family members, caretakers, and with consultants:1} {Document social determinants of health affecting pt's care:1} {Document your decision making why or why not admission, treatments were needed:1}  This note was created using dictation software, which may contain spelling or grammatical errors.  "

## 2024-02-22 NOTE — Care Management (Addendum)
 Transition of Care [facility]) - Emergency Department Mini Assessment   Patient Details  Name: Charles Lloyd. MRN: [id number] Date of Birth: 10-24-1940  [facility] CM/SW Contact:    Corean JAYSON Canary, RN Phone Number: 02/22/2024, 4:36 PM   Clinical Narrative:  83 year old recently had Back surgery L4-L5 PLLIF. Was also most recently in OP PT.  Has a PCP and Neurosurgery.  Consult for possible home health. Most recent home health was with [facility] on 12/12. Not in [facility].  Nurse relayed that he is still active according to the family with [facility] however they needd more support.  PT consult placed will likely board awaiting PT Spoke to son and daughter in law via phone. Thy state that he has fallen numerous times. Has a Rolator on the first and second floor. He has a stair lift, and high toilet seat. They feel that he needs more care than home health. Discussed boarding, medicare.gov for SNF ratings and process. He does not have a [facility] affiliation, has [facility] Medicare.  CSW to follow up with progress.Messaged with RN and Provider about home meds and diet while he is boarding. ED Mini Assessment: What brought you to the Emergency Department? : Fall           Interventions which prevented an admission or readmission: Home Health Consult or Services    Patient Contact and Communications        ,                 Admission diagnosis:  Fall Patient Active Problem List   Diagnosis Date Noted   Elective surgery 02/06/2024   Bilateral leg pain 11/29/2023   Acute posthemorrhagic anemia 06/21/2022   Bilateral lower extremity edema 04/06/2022   Coronary artery disease involving native coronary artery of native heart without angina pectoris 04/05/2022   Gout 02/27/2022   Acute renal failure superimposed on stage 3b chronic kidney disease (HCC) 11/16/2021   Uremia 11/16/2021   Gout due to renal impairment 10/17/2021   Pancytopenia (HCC) 08/05/2021   SIRS (systemic  inflammatory response syndrome) (HCC) 08/04/2021   Chronic kidney disease, stage 3b (HCC) 12/30/2020   Multiple falls    Spinal stenosis    S/P TKR (total knee replacement), bilateral    Arthralgia 11/20/2019   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   Lesion of liver 04/20/2019   Secondary hypercoagulable state 04/09/2019   Eosinophil count raised 02/17/2019   Acute tracheobronchitis 01/05/2019   Asthma    Vitamin D  deficiency 07/25/2018   Chronic anticoagulation 03/06/2018   Hyperlipidemia associated with type 2 diabetes mellitus (HCC) 01/28/2018   Basal cell carcinoma (BCC) of left side of nose 01/28/2018   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   Artificial knee joint present 05/20/2017   Left knee pain 05/07/2017   B12 deficiency 10/22/2016   Overweight (BMI 25.0-29.9) 10/22/2016   Primary osteoarthritis of right knee 03/29/2015   Osteoarthritis of right knee 03/29/2015   History of cholecystectomy 11/20/2014   Occult blood in stools 02/09/2014   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   Paroxysmal A-fib (HCC) 04/16/2013   Chronic heart failure with preserved ejection fraction (HFpEF) (HCC) 04/16/2013   Colon polyps 04/16/2013   NIDDM-2 with polyneuropathy and hyperglycemia 04/16/2013   Tinea cruris 04/16/2013   Diabetic neuropathy (HCC) 07/14/2012   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) 07/14/2012   Insomnia 03/04/2012   History of cardiovascular disorder 08/16/2011   Hypertensive heart disease with heart failure (HCC) 01/06/2010  Neoplasm of prostate, malignant (HCC) 01/06/2010   Hypercholesterolemia 01/06/2010   Essential hypertension 01/06/2010   PCP:  Theophilus Andrews, Tully GRADE, MD Pharmacy:   [facility] [address] [place] [address] Phone: [phone] Fax: [phone]  [id number] - [address], [place] - [address] [place] [address] Phone: [phone] Fax:  [phone]

## 2024-02-22 NOTE — ED Triage Notes (Addendum)
 Patient had fall today around 10 am in shower Hit head On eliquis  Pain rated 9/10  Vision blurry left eye since fall

## 2024-02-22 NOTE — Progress Notes (Signed)
 Pharmacy Antibiotic Note  Charles Lloyd. is a 83 y.o. male admitted on 02/22/2024 with wound infection.  Pharmacy has been consulted for vancomycin  dosing.  Plan: Vancomycin  1500mg  IV x 1 then 1500mg  q24h (AUC 505.8, Scr 0.98) Follow renal function and clinical course  Height: 5' 8 (172.7 cm) Weight: 79.4 kg (175 lb) IBW/kg (Calculated) : 68.4  Temp (24hrs), Avg:98.1 F (36.7 C), Min:97.9 F (36.6 C), Max:98.3 F (36.8 C)  Recent Labs  Lab 02/22/24 1322  WBC 4.1  CREATININE 0.98    Estimated Creatinine Clearance: 57.2 mL/min (by C-G formula based on SCr of 0.98 mg/dL).    Allergies[1]  Antimicrobials this admission: 12/27 vanc >> 12/27 zosyn  >>  Dose adjustments this admission:   Microbiology results: 12/27 BCx:   Thank you for allowing pharmacy to be a part of this patients care.  Leeroy Mace RPh 02/22/2024, 11:46 PM      [1]  Allergies Allergen Reactions   Amlodipine  Other (See Comments)    Severe edema, chest pain, nausea    Chlorhexidine  Gluconate Itching    Only itching with wipes, not with soap

## 2024-02-22 NOTE — Progress Notes (Signed)
 CSW spoke to son a daughter in social worker.  They are concerned with pt ability to safely DC home from ED as he has had fall and may need rehabilitation. Pt had a recent surgery.  Family shared that preferred SNF is [facility] or [facility].  ICM consult for SNF.  ICM following.

## 2024-02-22 NOTE — ED Provider Triage Note (Signed)
 Emergency Medicine Provider Triage Evaluation Note  Charles Lloyd , a 83 y.o. male  was evaluated in triage.  Pt complains of head injury.  Charles Lloyd and hit his head in the shower.  On Eliquis .  Admits to blurry vision of the left eye which is atypical for patient.  Normal vision prior to head injury.  Patient states he believes he got shampoo in his eye causing blurry vision. No double vision or visual field cuts. Denies speech changes and unilateral weakness. No other injuries.  He had lumbar back surgery.  Has had frequent falls over the past 6 to 8 weeks per family at bedside.  Review of Systems  Positive: Head injury, visual changes Negative: nausea  Physical Exam  BP (!) 118/56 (BP Location: Right Arm)   Pulse 78   Temp 97.9 F (36.6 C) (Oral)   Resp 17   Ht 5' 8 (1.727 m)   Wt 79.4 kg   SpO2 100%   BMI 26.61 kg/m  Gen:   Awake, no distress   Resp:  Normal effort  MSK:   Moves extremities without difficulty  Other:  No visual field cut, normal speech, no facial droop, no pronator drift  Medical Decision Making  Medically screening exam initiated at 12:51 PM.  Appropriate orders placed.  Charles Lloyd. was informed that the remainder of the evaluation will be completed by another provider, this initial triage assessment does not replace that evaluation, and the importance of remaining in the ED until their evaluation is complete.  Head injury on Eliquis . Patient brought straight to CT scanner after evaluation. No focal deficits on exam.    Lorelle Aleck BROCKS, PA-C 02/22/24 1254

## 2024-02-23 ENCOUNTER — Other Ambulatory Visit: Payer: Self-pay

## 2024-02-23 DIAGNOSIS — K6812 Psoas muscle abscess: Secondary | ICD-10-CM | POA: Diagnosis not present

## 2024-02-23 LAB — BASIC METABOLIC PANEL WITH GFR
Anion gap: 13 (ref 5–15)
BUN: 12 mg/dL (ref 8–23)
CO2: 21 mmol/L — ABNORMAL LOW (ref 22–32)
Calcium: 9 mg/dL (ref 8.9–10.3)
Chloride: 99 mmol/L (ref 98–111)
Creatinine, Ser: 0.86 mg/dL (ref 0.61–1.24)
GFR, Estimated: >60 mL/min
Glucose, Bld: 106 mg/dL — ABNORMAL HIGH (ref 70–99)
Potassium: 3.6 mmol/L (ref 3.5–5.1)
Sodium: 133 mmol/L — ABNORMAL LOW (ref 135–145)

## 2024-02-23 LAB — CK: Total CK: 53 U/L (ref 49–397)

## 2024-02-23 LAB — CBC
HCT: 27.9 % — ABNORMAL LOW (ref 39.0–52.0)
Hemoglobin: 9.3 g/dL — ABNORMAL LOW (ref 13.0–17.0)
MCH: 29.6 pg (ref 26.0–34.0)
MCHC: 33.3 g/dL (ref 30.0–36.0)
MCV: 88.9 fL (ref 80.0–100.0)
Platelets: 254 K/uL (ref 150–400)
RBC: 3.14 MIL/uL — ABNORMAL LOW (ref 4.22–5.81)
RDW: 16.8 % — ABNORMAL HIGH (ref 11.5–15.5)
WBC: 3.2 K/uL — ABNORMAL LOW (ref 4.0–10.5)
nRBC: 0 % (ref 0.0–0.2)

## 2024-02-23 LAB — GLUCOSE, CAPILLARY
Glucose-Capillary: 101 mg/dL — ABNORMAL HIGH (ref 70–99)
Glucose-Capillary: 106 mg/dL — ABNORMAL HIGH (ref 70–99)
Glucose-Capillary: 111 mg/dL — ABNORMAL HIGH (ref 70–99)
Glucose-Capillary: 118 mg/dL — ABNORMAL HIGH (ref 70–99)
Glucose-Capillary: 84 mg/dL (ref 70–99)
Glucose-Capillary: 92 mg/dL (ref 70–99)

## 2024-02-23 LAB — MAGNESIUM: Magnesium: 1.7 mg/dL (ref 1.7–2.4)

## 2024-02-23 LAB — PROTIME-INR
INR: 1.5 — ABNORMAL HIGH (ref 0.8–1.2)
Prothrombin Time: 18.9 s — ABNORMAL HIGH (ref 11.4–15.2)

## 2024-02-23 LAB — VITAMIN B12: Vitamin B-12: 573 pg/mL (ref 180–914)

## 2024-02-23 LAB — FOLATE: Folate: >20 ng/mL

## 2024-02-23 LAB — TSH: TSH: 2.51 u[IU]/mL (ref 0.350–4.500)

## 2024-02-23 LAB — C-REACTIVE PROTEIN: CRP: 4.9 mg/dL — ABNORMAL HIGH

## 2024-02-23 LAB — SEDIMENTATION RATE: Sed Rate: 26 mm/h — ABNORMAL HIGH (ref 0–16)

## 2024-02-23 LAB — CBG MONITORING, ED: Glucose-Capillary: 117 mg/dL — ABNORMAL HIGH (ref 70–99)

## 2024-02-23 MED ORDER — ATORVASTATIN CALCIUM 40 MG PO TABS
20.0000 mg | ORAL_TABLET | Freq: Every day | ORAL | Status: DC
  Administered 2024-02-23: 20 mg via ORAL
  Filled 2024-02-23: qty 2

## 2024-02-23 MED ORDER — GABAPENTIN 300 MG PO CAPS: 600.0000 mg | ORAL_CAPSULE | Freq: Every day | ORAL | Status: DC

## 2024-02-23 MED ORDER — THIAMINE MONONITRATE 100 MG PO TABS
100.0000 mg | ORAL_TABLET | Freq: Every day | ORAL | Status: DC
  Administered 2024-02-23 – 2024-03-31 (×33): 100 mg via ORAL
  Filled 2024-02-23 (×28): qty 1

## 2024-02-23 MED ORDER — MONTELUKAST SODIUM 10 MG PO TABS
10.0000 mg | ORAL_TABLET | Freq: Every day | ORAL | Status: DC
  Administered 2024-02-23: 10 mg via ORAL
  Filled 2024-02-23: qty 1

## 2024-02-23 MED ORDER — POTASSIUM CHLORIDE CRYS ER 20 MEQ PO TBCR
20.0000 meq | EXTENDED_RELEASE_TABLET | Freq: Every day | ORAL | Status: DC
  Administered 2024-02-23: 20 meq via ORAL
  Filled 2024-02-23: qty 1

## 2024-02-23 MED ORDER — POTASSIUM CHLORIDE CRYS ER 20 MEQ PO TBCR
20.0000 meq | EXTENDED_RELEASE_TABLET | Freq: Once | ORAL | Status: AC
  Administered 2024-02-23: 20 meq via ORAL
  Filled 2024-02-23: qty 1

## 2024-02-23 MED ORDER — FENTANYL CITRATE (PF) 50 MCG/ML IJ SOSY: 12.5000 ug | PREFILLED_SYRINGE | INTRAMUSCULAR | Status: DC | PRN

## 2024-02-23 MED ORDER — OXYCODONE HCL 5 MG PO TABS: 5.0000 mg | ORAL_TABLET | ORAL | Status: DC | PRN

## 2024-02-23 MED ORDER — GABAPENTIN 100 MG PO CAPS
100.0000 mg | ORAL_CAPSULE | ORAL | Status: DC
  Administered 2024-02-23: 100 mg via ORAL
  Filled 2024-02-23: qty 1

## 2024-02-23 MED ORDER — SENNOSIDES-DOCUSATE SODIUM 8.6-50 MG PO TABS: 1.0000 | ORAL_TABLET | Freq: Every evening | ORAL | Status: DC | PRN

## 2024-02-23 MED ORDER — HYDROCORTISONE 1 % EX CREA
TOPICAL_CREAM | Freq: Three times a day (TID) | CUTANEOUS | Status: DC
  Administered 2024-02-28: 1 via TOPICAL
  Filled 2024-02-23 (×3): qty 28

## 2024-02-23 MED ORDER — CARVEDILOL 3.125 MG PO TABS
3.1250 mg | ORAL_TABLET | Freq: Two times a day (BID) | ORAL | Status: DC
  Administered 2024-02-23 (×3): 3.125 mg via ORAL
  Filled 2024-02-23 (×3): qty 1

## 2024-02-23 MED ORDER — BISACODYL 5 MG PO TBEC: 5.0000 mg | DELAYED_RELEASE_TABLET | Freq: Every day | ORAL | Status: DC | PRN

## 2024-02-23 MED ORDER — HYDRALAZINE HCL 25 MG PO TABS
25.0000 mg | ORAL_TABLET | Freq: Four times a day (QID) | ORAL | Status: DC
  Administered 2024-02-23 (×3): 25 mg via ORAL
  Filled 2024-02-23 (×3): qty 1

## 2024-02-23 MED ORDER — FLECAINIDE ACETATE 100 MG PO TABS
100.0000 mg | ORAL_TABLET | Freq: Two times a day (BID) | ORAL | Status: DC
  Administered 2024-02-23 (×3): 100 mg via ORAL
  Filled 2024-02-23 (×3): qty 1

## 2024-02-23 MED ORDER — ACETAMINOPHEN 325 MG PO TABS
650.0000 mg | ORAL_TABLET | Freq: Four times a day (QID) | ORAL | Status: DC | PRN
  Administered 2024-02-25 – 2024-03-30 (×11): 650 mg via ORAL
  Filled 2024-02-23 (×7): qty 2

## 2024-02-23 MED ORDER — FOLIC ACID 1 MG PO TABS
1.0000 mg | ORAL_TABLET | Freq: Every day | ORAL | Status: DC
  Administered 2024-02-23: 1 mg via ORAL
  Filled 2024-02-23: qty 1

## 2024-02-23 MED ORDER — LORAZEPAM 1 MG PO TABS
1.0000 mg | ORAL_TABLET | ORAL | Status: AC | PRN
  Administered 2024-02-23: 2 mg via ORAL
  Filled 2024-02-23: qty 2

## 2024-02-23 MED ORDER — QUETIAPINE FUMARATE 25 MG PO TABS: 25.0000 mg | ORAL_TABLET | Freq: Every day | ORAL | Status: DC

## 2024-02-23 MED ORDER — LORAZEPAM 2 MG/ML IJ SOLN
1.0000 mg | INTRAMUSCULAR | Status: AC | PRN
  Administered 2024-02-23: 3 mg via INTRAVENOUS
  Administered 2024-02-23 – 2024-02-24 (×2): 2 mg via INTRAVENOUS
  Administered 2024-02-24: 4 mg via INTRAVENOUS
  Administered 2024-02-24: 2 mg via INTRAVENOUS
  Filled 2024-02-23: qty 1
  Filled 2024-02-23: qty 2

## 2024-02-23 MED ORDER — CYCLOBENZAPRINE HCL 10 MG PO TABS: 10.0000 mg | ORAL_TABLET | Freq: Three times a day (TID) | ORAL | Status: DC | PRN

## 2024-02-23 MED ORDER — PROCHLORPERAZINE EDISYLATE 10 MG/2ML IJ SOLN: 5.0000 mg | Freq: Four times a day (QID) | INTRAMUSCULAR | Status: DC | PRN

## 2024-02-23 MED ORDER — LINEZOLID 600 MG/300ML IV SOLN
600.0000 mg | Freq: Two times a day (BID) | INTRAVENOUS | Status: DC
  Administered 2024-02-23 – 2024-02-28 (×11): 600 mg via INTRAVENOUS
  Filled 2024-02-23 (×3): qty 300

## 2024-02-23 MED ORDER — INSULIN ASPART 100 UNIT/ML IJ SOLN
0.0000 [IU] | INTRAMUSCULAR | Status: DC
  Administered 2024-02-24: 1 [IU] via SUBCUTANEOUS
  Administered 2024-02-27: 2 [IU] via SUBCUTANEOUS

## 2024-02-23 MED ORDER — DIPHENHYDRAMINE HCL 25 MG PO CAPS
25.0000 mg | ORAL_CAPSULE | Freq: Once | ORAL | Status: AC
  Administered 2024-02-23: 25 mg via ORAL
  Filled 2024-02-23: qty 1

## 2024-02-23 MED ORDER — GABAPENTIN 300 MG PO CAPS
300.0000 mg | ORAL_CAPSULE | ORAL | Status: DC
  Administered 2024-02-23: 300 mg via ORAL
  Filled 2024-02-23: qty 1

## 2024-02-23 MED ORDER — ALLOPURINOL 100 MG PO TABS
300.0000 mg | ORAL_TABLET | Freq: Every day | ORAL | Status: DC
  Administered 2024-02-23: 300 mg via ORAL
  Filled 2024-02-23: qty 3

## 2024-02-23 MED ORDER — ADULT MULTIVITAMIN W/MINERALS CH
1.0000 | ORAL_TABLET | Freq: Every day | ORAL | Status: DC
  Administered 2024-02-23: 1 via ORAL
  Filled 2024-02-23: qty 1

## 2024-02-23 MED ORDER — SODIUM CHLORIDE 0.9 % IV SOLN: INTRAVENOUS | Status: AC

## 2024-02-23 MED ORDER — SODIUM CHLORIDE 0.9% FLUSH: 10.0000 mL | INTRAVENOUS | Status: DC | PRN

## 2024-02-23 MED ORDER — ACETAMINOPHEN 650 MG RE SUPP
650.0000 mg | Freq: Four times a day (QID) | RECTAL | Status: DC | PRN
  Administered 2024-03-22: 650 mg via RECTAL
  Filled 2024-02-23 (×6): qty 1

## 2024-02-23 MED ORDER — THIAMINE HCL 100 MG/ML IJ SOLN
100.0000 mg | Freq: Every day | INTRAMUSCULAR | Status: DC
  Administered 2024-02-24 – 2024-02-25 (×2): 100 mg via INTRAVENOUS

## 2024-02-23 MED ORDER — CYCLOSPORINE 0.05 % OP EMUL
1.0000 [drp] | Freq: Every day | OPHTHALMIC | Status: DC
  Administered 2024-02-23 – 2024-03-31 (×36): 1 [drp] via OPHTHALMIC
  Filled 2024-02-23 (×31): qty 30

## 2024-02-23 MED ORDER — SODIUM CHLORIDE 0.9% FLUSH
3.0000 mL | Freq: Two times a day (BID) | INTRAVENOUS | Status: DC
  Administered 2024-02-23 – 2024-03-31 (×48): 3 mL via INTRAVENOUS

## 2024-02-23 NOTE — H&P (Signed)
 " History and Physical    Charles Lloyd Juanito Mickey. FMW:[id number] DOB: August 17, 1940 DOA: 02/22/2024  PCP: Theophilus Andrews, Tully GRADE, MD   Patient coming from: Home   Chief Complaint: Falls, pain, weakness   HPI: Charles Lloyd. is an 83 y.o. male with medical history significant for hypertension, hyperlipidemia, type 2 diabetes mellitus, atrial fibrillation on Eliquis , and L4-5 spondylolisthesis status post L4-5 trans psoas lumbar interbody fusion left-sided approach on 02/06/2024, now presenting with ongoing leg weakness, pain, and recurrent falls.  Patient reports that he has been falling which he attributes to severe pain in the hips and the legs, left greater than right, as well as tremor and leg weakness.  He fell in the shower tonight and hit his head.  He did not lose consciousness.  He had some blurred vision in his left eye earlier that he attributed to getting shampoo in the eye, but that has resolved.  He denies any fevers or chills.  ED Course: Upon arrival to the ED, patient is found to be afebrile and saturating well on room air with normal RR, normal HR, and stable BP.  Labs are most notable for normal creatinine, normal WBC, and hemoglobin 10.7.  Imaging in the ED is most notable for new 3.5 x 2.4 cm fluid collection in the left psoas muscle noted on MRI and concerning for abscess.  Neurosurgery (Dr. Colon) was consulted by the ED physician and recommended IV antibiotics and IR consultation for possible aspiration.  Blood cultures were collected in the ED and the patient was treated with vancomycin , Zosyn , acetaminophen , and oxycodone .  Review of Systems:  All other systems reviewed and apart from HPI, are negative.  Past Medical History:  Diagnosis Date   Acute tracheobronchitis 01/05/2019   B12 deficiency    Basal cell carcinoma (BCC) of left side of nose 01/28/2018   Cancer (HCC)    Cardiomyopathy (HCC) 03/10/2018   With chronic atrial fibrillation   CHF (congestive  heart failure) (HCC) 04/16/2013   Functional class II, ejection fraction 35-40%  Formatting of this note might be different from the original. Functional class II, ejection fraction 35-40%   Chronic anticoagulation    Chronic diastolic (congestive) heart failure (HCC) 04/16/2013   Functional class II, ejection fraction 35-40%  Last Assessment & Plan:  Clinically stable Volume well controlled meds reviewed   Chronic diastolic CHF (congestive heart failure) (HCC) 04/16/2013   Functional class II, ejection fraction 35-40%  Last Assessment & Plan:  Clinically stable Volume well controlled meds reviewed   Colon polyps 04/16/2013   Coronary artery disease involving native coronary artery of native heart without angina pectoris 04/05/2022   Myoview  05/25/2019: EF 60, normal perfusion, low risk CCTA (AF protocol) 04/25/20: CAC score 4242 (99th percentile)   Diabetic neuropathy (HCC)    Difficult intubation    pt reports difficult intubation during surgery in [place] in 2015   Dysrhythmia    A.Fib   Eosinophil count raised 02/17/2019   Essential hypertension 01/06/2010   Last Assessment & Plan:  Well controlled Continue med management   Gout    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  Steroid usage was discontinued several months ago.  Follow up with Dermatology as planned.   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   Last Assessment & Plan:  Id following ?ongoing abx managementy reported Request records   Infection of prosthetic right knee joint    Insomnia 03/04/2012   Grief with loss of wife 02/20/12  Last Assessment &  Plan:  Improved sx  contineu xanax prn Se discussed   Lesion of liver 04/20/2019   -On cardiac CT 02/2019   Mild reactive airways disease    Neoplasm of prostate 04/16/2013   Neoplasm of prostate, 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 Transthoracic Echocardiogram-03/10/2013-[facility]: Normal left ventricular wall thickness and cavity size.  Global left ventricular systolic function is moderately reduced.  The estimated ejection fraction is 35-40%.  The left atrium is moderately enlarged.  The right atrium is mildly enlarged.  No significant valvular    Overweight (BMI 25.0-29.9) 10/22/2016   Persistent atrial fibrillation (HCC) 04/16/2013   Last Assessment & Plan:  Rate controlled Continue med management eliquis  for stroke preventino  Formatting of this note might be different from the original.  Drug  HX Current Rx Pre-ABL inefficacy Pre-ABL intolerant Post-ABL inefficacy Post-ABL intolerant max dose/24h M/Y end comments  sotalol                  dofetilide                  flecainide                   propafenone                  am   S/P TKR (total knee replacement), bilateral    Secondary hypercoagulable state 04/09/2019   Tinea cruris 04/16/2013   Type 2 diabetes mellitus with diabetic polyneuropathy, without long-term current use of insulin  (HCC) 04/16/2013   Last Assessment & Plan:  Labs today Pt reports well controlled on ambulatory monitoring   Vitamin D  deficiency 07/25/2018    Past Surgical History:  Procedure Laterality Date   ANTERIOR LAT LUMBAR FUSION N/A 02/06/2024   Procedure: ANTERIOR PRONE LATERAL LUMBAR FUSION LUMBAR FOUR-LUMBAR FIVE;  Surgeon: Darnella Dorn SAUNDERS, MD;   Location: [facility] OR;  Service: Neurosurgery;  Laterality: N/A;   ATRIAL FIBRILLATION ABLATION N/A 03/12/2019   Procedure: ATRIAL FIBRILLATION ABLATION;  Surgeon: Inocencio Soyla Lunger, MD;  Location: [facility] INVASIVE CV LAB;  Service: Cardiovascular;  Laterality: N/A;   ATRIAL FIBRILLATION ABLATION N/A 04/29/2020   Procedure: ATRIAL FIBRILLATION ABLATION;  Surgeon: Inocencio Soyla Lunger, MD;  Location: [facility] INVASIVE CV LAB;  Service: Cardiovascular;  Laterality: N/A;   CARDIAC ELECTROPHYSIOLOGY STUDY AND ABLATION     CARDIOVERSION     CARDIOVERSION N/A 10/10/2018   Procedure: CARDIOVERSION;  Surgeon: Delford Maude BROCKS, MD;  Location: [facility] ENDOSCOPY;  Service: Cardiovascular;  Laterality: N/A;   CARDIOVERSION N/A 05/18/2020   Procedure: CARDIOVERSION;  Surgeon: Alveta Aleene PARAS, MD;  Location: [facility] ENDOSCOPY;  Service: Cardiovascular;  Laterality: N/A;   CARDIOVERSION N/A 12/09/2020   Procedure: CARDIOVERSION;  Surgeon: Pietro Redell RAMAN, MD;  Location: [facility] ENDOSCOPY;  Service: Cardiovascular;  Laterality: N/A;   CHOLECYSTECTOMY  PROSTATECTOMY     REPLACEMENT TOTAL KNEE BILATERAL      Social History:   reports that he has never smoked. He has never been exposed to tobacco smoke. He has never used smokeless tobacco. He reports that he does not currently use alcohol after a past usage of about 14.0 standard drinks of alcohol per week. He reports that he does not use drugs.  Allergies[1]  Family History  Problem Relation Age of Onset   Cancer Mother    Depression Mother    Early death Mother    Cancer Father    Depression Father    Early death Father      Prior to Admission medications  Medication Sig Start Date End Date Taking? Authorizing Provider  ACCU-CHEK AVIVA PLUS test strip 1 EACH BY OTHER ROUTE DAILY. DX E11.9 08/28/21   Theophilus Andrews, Tully GRADE, MD  allopurinol  (ZYLOPRIM ) 300 MG tablet TAKE 1 TABLET BY MOUTH EVERY DAY 01/13/24   Theophilus Andrews, Tully GRADE, MD  apixaban  (ELIQUIS ) 5 MG TABS  tablet Take 1 tablet (5 mg total) by mouth 2 (two) times daily. Patient not taking: Reported on 02/11/2024 08/02/23   Theophilus Andrews, Tully GRADE, MD  atorvastatin  (LIPITOR) 20 MG tablet TAKE 1 TABLET DAILY (PLEASE SCHEDULE A PHYSICAL FOR MORE REFILLS) 12/09/23   Panosh, Apolinar POUR, MD  carvedilol  (COREG ) 3.125 MG tablet TAKE 1 TABLET BY MOUTH TWICE A DAY 12/31/23   Theophilus Andrews, Tully GRADE, MD  Continuous Blood Gluc Receiver (FREESTYLE LIBRE 2 READER) DEVI 1 each by Does not apply route daily. 08/28/21   Theophilus Andrews, Tully GRADE, MD  Continuous Blood Gluc Sensor (FREESTYLE LIBRE 2 SENSOR) MISC 1 each by Does not apply route daily. 08/28/21   Theophilus Andrews, Tully GRADE, MD  cyclobenzaprine  (FLEXERIL ) 10 MG tablet Take 1 tablet (10 mg total) by mouth 3 (three) times daily as needed for muscle spasms. 02/07/24   Darnella Dorn SAUNDERS, MD  docusate sodium  (COLACE) 100 MG capsule Take 1 capsule (100 mg total) by mouth daily as needed (please take daily while on narcotic pain medication). 02/07/24 02/06/25  Darnella Dorn SAUNDERS, MD  flecainide  (TAMBOCOR ) 100 MG tablet TAKE 1 TABLET BY MOUTH TWICE A DAY 10/24/23   Camnitz, Soyla Lunger, MD  fluticasone  (FLONASE ) 50 MCG/ACT nasal spray Place 2 sprays into both nostrils daily. 01/07/19   Rosario Leatrice FERNS, MD  gabapentin  (NEURONTIN ) 300 MG capsule Take one tablet in the morning, one tablet in the afternoon, and 2 tablets at bedtime 12/12/23   Theophilus Andrews, Tully GRADE, MD  hydrALAZINE  (APRESOLINE ) 25 MG tablet TAKE 1 TABLET (25 MG TOTAL) BY MOUTH 4 (FOUR) TIMES DAILY. Patient taking differently: Take 25 mg by mouth in the morning and at bedtime. 10/22/23   Krasowski, Robert J, MD  montelukast  (SINGULAIR ) 10 MG tablet Take 10 mg by mouth in the morning.    [provider]  oxyCODONE  (OXY IR/ROXICODONE ) 5 MG immediate release tablet Take 1 tablet (5 mg total) by mouth every 6 (six) hours as needed for moderate pain (pain score 4-6). 02/07/24   Darnella Dorn SAUNDERS, MD   potassium chloride  SA (KLOR-CON  M20) 20 MEQ tablet Take 1 tablet (20 mEq total) by mouth daily. 09/18/23   Theophilus Andrews, Tully GRADE, MD  RESTASIS  0.05 % ophthalmic emulsion Place 1 drop into both eyes in the morning. 12/09/19   [provider]  torsemide  (DEMADEX ) 20 MG tablet Take 1 tablet (20 mg total) by mouth 2 (two) times daily.  05/03/23   Bernie Lamar PARAS, MD    Physical Exam: Vitals:   02/22/24 1627 02/22/24 1800 02/22/24 2003 02/22/24 2030  BP:  (!) 160/81 121/79 (!) 161/73  Pulse:  88 78 75  Resp:  17 18 18   Temp: 98.1 F (36.7 C)  98.3 F (36.8 C)   TempSrc: Oral  Oral   SpO2:  94% 98% 98%  Weight:      Height:        Constitutional: NAD, calm  Eyes: PERTLA, lids and conjunctivae normal ENMT: Mucous membranes are moist. Posterior pharynx clear of any exudate or lesions.   Neck: supple, no masses  Respiratory: no wheezing, no crackles. No accessory muscle use.  Cardiovascular: S1 & S2 heard, regular rate and rhythm. Lower leg edema b/l.   Abdomen: No tenderness, soft. Bowel sounds active.  Musculoskeletal: no clubbing / cyanosis. No joint deformity upper and lower extremities.   Skin: no significant rashes, lesions, ulcers. Warm, dry, well-perfused. Neurologic: CN 2-12 grossly intact. Moving all extremities, LE strength testing limited by pain. Alert and oriented to person, place, time, and situation.  Psychiatric: Pleasant. Cooperative.    Labs and Imaging on Admission: I have personally reviewed following labs and imaging studies  CBC: Recent Labs  Lab 02/22/24 1322  WBC 4.1  NEUTROABS 2.1  HGB 10.7*  HCT 32.4*  MCV 89.3  PLT 289   Basic Metabolic Panel: Recent Labs  Lab 02/22/24 1322  NA 134*  K 3.4*  CL 99  CO2 23  GLUCOSE 172*  BUN 14  CREATININE 0.98  CALCIUM  9.2  MG 1.8   GFR: Estimated Creatinine Clearance: 57.2 mL/min (by C-G formula based on SCr of 0.98 mg/dL). Liver Function Tests: Recent Labs  Lab 02/22/24 1322  AST  33  ALT 17  ALKPHOS 128*  BILITOT 1.0  PROT 5.7*  ALBUMIN  3.4*   No results for input(s): LIPASE, AMYLASE in the last 168 hours. No results for input(s): AMMONIA in the last 168 hours. Coagulation Profile: No results for input(s): INR, PROTIME in the last 168 hours. Cardiac Enzymes: No results for input(s): CKTOTAL, CKMB, CKMBINDEX, TROPONINI in the last 168 hours. BNP (last 3 results) No results for input(s): PROBNP in the last 8760 hours. HbA1C: No results for input(s): HGBA1C in the last 72 hours. CBG: No results for input(s): GLUCAP in the last 168 hours. Lipid Profile: No results for input(s): CHOL, HDL, LDLCALC, TRIG, CHOLHDL, LDLDIRECT in the last 72 hours. Thyroid  Function Tests: No results for input(s): TSH, T4TOTAL, FREET4, T3FREE, THYROIDAB in the last 72 hours. Anemia Panel: No results for input(s): VITAMINB12, FOLATE, FERRITIN, TIBC, IRON, RETICCTPCT in the last 72 hours. Urine analysis:    Component Value Date/Time   COLORURINE AMBER (A) 02/22/2024 2125   APPEARANCEUR CLEAR 02/22/2024 2125   LABSPEC 1.023 02/22/2024 2125   PHURINE 5.0 02/22/2024 2125   GLUCOSEU NEGATIVE 02/22/2024 2125   HGBUR NEGATIVE 02/22/2024 2125   BILIRUBINUR NEGATIVE 02/22/2024 2125   BILIRUBINUR 1+ 07/12/2022 1120   KETONESUR NEGATIVE 02/22/2024 2125   PROTEINUR 30 (A) 02/22/2024 2125   UROBILINOGEN negative (A) 07/12/2022 1120   NITRITE NEGATIVE 02/22/2024 2125   LEUKOCYTESUR NEGATIVE 02/22/2024 2125   Sepsis Labs: @LABRCNTIP (procalcitonin:4,lacticidven:4) )No results found for this or any previous visit (from the past 240 hours).   Radiological Exams on Admission: MR Lumbar Spine W Wo Contrast Result Date: 02/22/2024 EXAM: MRI LUMBAR SPINE 02/22/2024 06:47:20 PM TECHNIQUE: Multiplanar multisequence MRI of the lumbar spine was performed without  or with the administration of intravenous contrast. COMPARISON: MRI lumbar  spine 10/30/2023 CLINICAL HISTORY: Low back pain, cauda equina syndrome suspected FINDINGS: SEGMENTATION: Transitional lumbosacral anatomy. The transitional lumbosacral vertebra is assumed to represent the S1 level. This conforms to the segmentation utilized on prior exams. BONES AND ALIGNMENT: Similar grade 1 retrolisthesis of L2 on L3 and improved grade 1 anterolisthesis of L4 on L5. Interval L4-L5 interbody fusion and PLIF. Hardware artifact limits assessment, but no obviouse bone marrow signal abnormality. SPINAL CORD: The conus terminates normally. SOFT TISSUES: New 3.5 x 2.4 cm fluid collection in the left psoas muscle at the L4-L5 level. More illdefined edema in the posterior paraspinal soft tissues with small fluid pockets is more typical of postoperative change. L1-L2 Disc height loss with degenerative endplate signal changes. Disc bulge with mild to moderate canal stenosis. Patent foramina. L2-L3 Disc bulging and endplate spurring with ligamentum flavum thickening, resulting in progressive severe canal stenosis. Bilateral facet arthropathy with progressive moderate to severe left foraminal stenosis. Similar moderate right foraminal stenosis. L3-L4 Broad disc bulge with ligamentum flavum thickening and bilateral facet arthropathy. Similar versus slightly progressive moderate to severe canal stenosis. Similar moderate left foraminal stenosis. L4-L5 Interbody fusion and PLIF.  Persistent severe canal stenosis. L5-S1 Mild disc bulge with similar borderline mild foraminal stenosis. S1-S2 Transitional anatomy. No spinal canal stenosis or neural foraminal narrowing. IMPRESSION: 1. New 3.5 x 2.4 cm fluid collection in the left psoas muscle at the L4-L5 level with surrounding edema. The location is atypical for postoperative changes and is concerning for abscess. No obvious changes of discitis/osteomyelitis but hardware artifact limits assessment. Recommend correlation with infectious markers. 2. At L2-L3,  progressive severe canal stenosis and moderate to severe left foraminal stenosis. Similar moderate right foraminal stenosis. 3. At L3-L4, similar versus slightly progressive moderate to severe canal stenosis. 4. Interval fusion at L4-L5 with improved alignment but persistent severe canal stenosis. 5. Transitional lumbosacral vertebra is assumed to represent the S1 level. This conforms to the segmentation utilized on prior exams. Electronically signed by: Gilmore Molt MD 02/22/2024 09:12 PM EST RP Workstation: [id number]   CT PELVIS WO CONTRAST Result Date: 02/22/2024 CLINICAL DATA:  Fall in shower. History of frequent falls over the past 6-8 weeks. EXAM: CT PELVIS WITHOUT CONTRAST TECHNIQUE: Multidetector CT imaging of the pelvis was performed following the standard protocol without intravenous contrast. RADIATION DOSE REDUCTION: This exam was performed according to the departmental dose-optimization program which includes automated exposure control, adjustment of the mA and/or kV according to patient size and/or use of iterative reconstruction technique. COMPARISON:  Pelvic radiographs dated 02/22/2024. CT pelvis dated 10/30/2023. FINDINGS: Bones/Joint/Cartilage No acute fracture or dislocation. Femoral heads are seated within the acetabulum. 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 Bilateral greater trochanteric enthesopathy. The sacroiliac joints and pubic symphysis are anatomically aligned with degenerative changes. Partially visualized postoperative changes of the lower lumbar spine. Soft tissue and Muscles Soft tissue swelling along the left posterolateral abdominal wall. No loculated fluid collection. Intrapelvic contents Prostate implant seeds. Descending and sigmoid colonic diverticulosis. Moderate vascular calcifications again noted. No enlarged lymph nodes identified in the field of view. IMPRESSION: 1. No acute  osseous abnormality. 2. Soft tissue swelling along the left posterolateral abdominal wall. No fluid collection. 3. 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 Electronically Signed   By: Harrietta Sherry M.D.   On: 02/22/2024 16:13   CT Lumbar Spine Wo Contrast Result Date: 02/22/2024 EXAM:  CT OF THE LUMBAR SPINE WITHOUT CONTRAST 02/22/2024 03:28:05 PM TECHNIQUE: CT of the lumbar spine was performed without the administration of intravenous contrast. Multiplanar reformatted images are provided for review. Automated exposure control, iterative reconstruction, and/or weight based adjustment of the mA/kV was utilized to reduce the radiation dose to as low as reasonably achievable. COMPARISON: MRI and CT lumbar spine 10/30/2023. CLINICAL HISTORY: Back trauma, no prior imaging (Age >= 16y). FINDINGS: BONES AND ALIGNMENT: Normal vertebral body heights. No evidence of compression fracture or displaced fracture in the lumbar spine. Redemonstrated transitional anatomy at the lumbosacral junction with a partially lumbarized S1 vertebral body and a near full size disc at the S1-S2 level. Interval PLIF at L4-L5 with bilateral pedicle screws and vertical interconnecting rods. Hardware is intact. Interbody spacer at L4-L5. There is no osseous fusion across the L4-L5 disc space on the current study. Diffuse osteopenia. Degenerative changes of the sacroiliac joints. Alignment: Trace degenerative retrolisthesis of L1 on L2 and L2 on L3. Similar grade 1 anterolisthesis of L4 on L5. DEGENERATIVE CHANGES: Disc space narrowing and vacuum disc phenomenon at multiple levels. Degenerative endplate osteophytes at multiple levels. Posterior osteophytes along with facet arthrosis at L2-L3 contributing to moderate to severe spinal canal stenosis. Additional disc bulge and facet arthrosis at L3-L4 resulting in at least moderate spinal canal stenosis, although streak artifact slightly limits evaluation. Streak  artifact limits evaluation of the spinal canal at L4-L5; there is likely residual moderate to severe spinal canal stenosis at this level. SOFT TISSUES: Atherosclerosis of the abdominal aorta and branch vessels. Additional prominent atherosclerosis within the pelvis. Cholecystectomy clips. Diverticulosis of the partially visualized sigmoid colon. IMPRESSION: 1. No evidence of acute traumatic injury. 2. Interval PLIF at L4-L5. Hardware is intact. No osseous fusion across the L4-L5 disc space. 3. 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 4. At least moderate spinal canal stenosis at L3-L4 due to disc bulge and facet arthrosis. 5. Likely residual moderate to severe spinal canal stenosis at L4-L5, evaluation limited by streak artifact. Electronically signed by: Donnice Mania MD 02/22/2024 04:04 PM EST RP Workstation: [id number]   DG Pelvis 1-2 Views Result Date: 02/22/2024 EXAM: 1 or 2 VIEW(S) XRAY OF THE PELVIS 02/22/2024 01:09:00 PM COMPARISON: None available. CLINICAL HISTORY: [id number] Fall [id number] FINDINGS: BONES AND JOINTS: No acute fracture. No malalignment. Lumbar spinal fusion hardware noted. Moderate bilateral hip degenerative changes. SOFT TISSUES: Surgical clips overlie the pelvis. Vascular calcifications noted. IMPRESSION: 1. No evidence of acute traumatic injury. Electronically signed by: Morgane Naveau MD 02/22/2024 01:32 PM EST RP Workstation: [id number]   DG Chest 1 View Result Date: 02/22/2024 EXAM: 1 VIEW(S) XRAY OF THE CHEST 02/22/2024 01:09:00 PM COMPARISON: 10/19/2022 CLINICAL HISTORY: Fall FINDINGS: LUNGS AND PLEURA: No focal pulmonary opacity. No pleural effusion. No pneumothorax. HEART AND MEDIASTINUM: Calcified aorta. BONES AND SOFT TISSUES: Thoracic degenerative changes. No acute osseous abnormality. IMPRESSION: 1. No acute process. Electronically signed by: Morgane Naveau MD 02/22/2024 01:31 PM EST RP Workstation: [id number]   CT  Cervical Spine Wo Contrast Result Date: 02/22/2024 EXAM: CT CERVICAL SPINE WITHOUT CONTRAST 02/22/2024 12:58:30 PM TECHNIQUE: CT of the cervical spine was performed without the administration of intravenous contrast. Multiplanar reformatted images are provided for review. Automated exposure control, iterative reconstruction, and/or weight based adjustment of the mA/kV was utilized to reduce the radiation dose to as low as reasonably achievable. COMPARISON: 10/30/2023 and 12/30/2020 CLINICAL HISTORY: Neck trauma (Age >= 65y) FINDINGS: BONES AND ALIGNMENT: No  acute fracture or traumatic malalignment. There is straightening of the normal cervical lordosis. There is 3 mm anterolisthesis of C7 on T1 which is increased by approximately 1 mm since the 2022 study. Additional 4 mm anterolisthesis of T1 on T2 which is increased by approximately 1.5 x 2 mm since the 2022 study. DEGENERATIVE CHANGES: There is moderate to severe disc space narrowing at multiple levels in the cervical spine most pronounced at C5-C6 and C6-C7 overall similar to prior. There is severe disc space narrowing at T1-T2 which has increased from prior. Degenerative endplate osteophytes at multiple levels. Disc osteophyte complexes at multiple levels without high grade osseous spinal canal stenosis. There is facet arthrosis and uncovertebral hypertrophy at multiple levels. SOFT TISSUES: No prevertebral soft tissue swelling. IMPRESSION: 1. No acute cervical spine fracture. 2. 3 mm anterolisthesis of C7 on T1, increased from prior. 4 mm anterolisthesis of T1 on T2, increased since 2022. Findings likely related to increased degenerative changes. Electronically signed by: Donnice Mania MD 02/22/2024 01:21 PM EST RP Workstation: [id number]   CT Head Wo Contrast Result Date: 02/22/2024 EXAM: CT HEAD WITHOUT CONTRAST 02/22/2024 12:58:30 PM TECHNIQUE: CT of the head was performed without the administration of intravenous contrast. Automated exposure control,  iterative reconstruction, and/or weight based adjustment of the mA/kV was utilized to reduce the radiation dose to as low as reasonably achievable. COMPARISON: 10/30/2023 CLINICAL HISTORY: Head trauma, minor (Age >= 65y) FINDINGS: BRAIN AND VENTRICLES: No acute hemorrhage. No evidence of acute infarct. No hydrocephalus. No extra-axial collection. No mass effect or midline shift. Patchy and confluent decreased attenuation throughout the deep and periventricular white matter of the cerebral hemispheres bilaterally, compatible with chronic microvascular ischemic disease. Cerebral ventricle sizes concordant with degree of cerebral volume loss. Atherosclerotic calcifications within the cavernous internal carotid arteries. ORBITS: No acute abnormality. Left lens replacement. SINUSES: No acute abnormality. SOFT TISSUES AND SKULL: No acute soft tissue abnormality. No skull fracture. IMPRESSION: 1. No acute intracranial abnormality. Electronically signed by: Donnice Mania MD 02/22/2024 01:14 PM EST RP Workstation: [id number]    EKG: Independently reviewed. Accelerated junctional rhythm, RBBB.   Assessment/Plan   1. Left psoas abscess  - Culture blood, continue broad-spectrum antibiotics, consult IR for aspiration    2. Weakness and falls  - Patient reports multiple falls in last few weeks that he attributes to leg weakness, pain, and tremors    - Check TSH, CK, B12, and folate, follow-up on neurosurgery assessment, consult PT   3. Atrial fibrillation  - Hold Eliquis  pending IR and neurosurgery consultation, continue flecainide  and Coreg  as tolerated    4. Hypertension  - Continue Coreg  and hydralazine     5. Type II DM  - A1c was 5.9% earlier this month  - Check CBGs and use low-intensity SSI if needed    6. Chronic HFpEF  - Appears compensated   7. Asthma  - Not in exacerbation  - Continue Singulair  and as-needed albuterol     DVT prophylaxis: SCDs  Code Status: Full  Level of Care: Level of  care: Med-Surg Family Communication: Family updated from ED   Disposition Plan:  Patient is from: Home  Anticipated d/c is to: TBD Anticipated d/c date is: 02/26/24  Patient currently: Pending IR and neurosurgery consultations, disposition planning Consults called: Neurosurgery  Admission status: Inpatient     Evalene Lloyd Sprinkles, MD [facility]  02/23/2024, 12:11 AM       [1]  Allergies Allergen Reactions   Amlodipine  Other (See Comments)  Severe edema, chest pain, nausea    Chlorhexidine  Gluconate Itching    Only itching with wipes, not with soap   "

## 2024-02-23 NOTE — Consult Note (Signed)
 Reason for Consult: Psoas abscess Referring Physician: Dr. [name] is an 83 y.o. male.  HPI: The patient is an 83 year old individual who had an extreme lateral decompression at L4-L5 followed by percutaneous screw fixation at L4-L5.  The patient has developed worsening pain back in the left side to the left lower extremity.  An MRI was performed today which demonstrates the presence of an evolving abscess in the left psoas muscle.  Been asked to consult for this process.  The patient underwent surgery on 11 December by Dr. Darnella.  He had recovered well initially but he notes that there has been an increase in pain in the last week's time.  Past Medical History:  Diagnosis Date   Acute tracheobronchitis 01/05/2019   B12 deficiency    Basal cell carcinoma (BCC) of left side of nose 01/28/2018   Cancer (HCC)    Cardiomyopathy (HCC) 03/10/2018   With chronic atrial fibrillation   CHF (congestive heart failure) (HCC) 04/16/2013   Functional class II, ejection fraction 35-40%  Formatting of this note might be different from the original. Functional class II, ejection fraction 35-40%   Chronic anticoagulation    Chronic diastolic (congestive) heart failure (HCC) 04/16/2013   Functional class II, ejection fraction 35-40%  Last Assessment & Plan:  Clinically stable Volume well controlled meds reviewed   Chronic diastolic CHF (congestive heart failure) (HCC) 04/16/2013   Functional class II, ejection fraction 35-40%  Last Assessment & Plan:  Clinically stable Volume well controlled meds reviewed   Colon polyps 04/16/2013   Coronary artery disease involving native coronary artery of native heart without angina pectoris 04/05/2022   Myoview  05/25/2019: EF 60, normal perfusion, low risk CCTA (AF protocol) 04/25/20: CAC score 4242 (99th percentile)   Diabetic neuropathy (HCC)    Difficult intubation    pt reports difficult intubation during surgery in [place] in 2015    Dysrhythmia    A.Fib   Eosinophil count raised 02/17/2019   Essential hypertension 01/06/2010   Last Assessment & Plan:  Well controlled Continue med management   Gout    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  Steroid usage was discontinued several months ago.  Follow up with Dermatology as planned.   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   Last Assessment & Plan:  Id following ?ongoing abx managementy reported Request records   Infection of prosthetic right knee joint    Insomnia 03/04/2012   Grief with loss of wife 02/20/12  Last Assessment & Plan:  Improved sx  contineu xanax prn Se discussed   Lesion of liver 04/20/2019   -On cardiac CT 02/2019   Mild reactive airways disease    Neoplasm of prostate 04/16/2013   Neoplasm of prostate, 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 Transthoracic Echocardiogram-03/10/2013-[facility]: Normal left ventricular wall thickness and cavity  size.  Global left ventricular systolic function is moderately reduced.  The estimated ejection fraction is 35-40%.  The left atrium is moderately enlarged.  The right atrium is mildly enlarged.  No significant valvular    Overweight (BMI 25.0-29.9) 10/22/2016   Persistent atrial fibrillation (HCC) 04/16/2013   Last Assessment & Plan:   Rate controlled Continue med management eliquis  for stroke preventino  Formatting of this note might be different from the original.  Drug  HX Current Rx Pre-ABL inefficacy Pre-ABL intolerant Post-ABL inefficacy Post-ABL intolerant max dose/24h M/Y end comments  sotalol                  dofetilide                  flecainide                   propafenone                  am   S/P TKR (total knee replacement), bilateral    Secondary hypercoagulable state 04/09/2019   Tinea cruris 04/16/2013   Type 2 diabetes mellitus with diabetic polyneuropathy, without long-term current use of insulin  (HCC) 04/16/2013   Last Assessment & Plan:  Labs today Pt reports well controlled on ambulatory monitoring   Vitamin D  deficiency 07/25/2018    Past Surgical History:  Procedure Laterality Date   ANTERIOR LAT LUMBAR FUSION N/A 02/06/2024   Procedure: ANTERIOR PRONE LATERAL LUMBAR FUSION LUMBAR FOUR-LUMBAR FIVE;  Surgeon: Darnella Dorn SAUNDERS, MD;  Location: [facility] OR;  Service: Neurosurgery;  Laterality: N/A;   ATRIAL FIBRILLATION ABLATION N/A 03/12/2019   Procedure: ATRIAL FIBRILLATION ABLATION;  Surgeon: Inocencio Soyla Lunger, MD;  Location: [facility] INVASIVE CV LAB;  Service: Cardiovascular;  Laterality: N/A;   ATRIAL FIBRILLATION ABLATION N/A 04/29/2020   Procedure: ATRIAL FIBRILLATION ABLATION;  Surgeon: Inocencio Soyla Lunger, MD;  Location: [facility] INVASIVE CV LAB;  Service: Cardiovascular;  Laterality: N/A;   CARDIAC ELECTROPHYSIOLOGY STUDY AND ABLATION     CARDIOVERSION     CARDIOVERSION N/A 10/10/2018   Procedure: CARDIOVERSION;  Surgeon: Delford Maude BROCKS, MD;  Location: [facility] ENDOSCOPY;  Service: Cardiovascular;  Laterality: N/A;   CARDIOVERSION N/A 05/18/2020   Procedure: CARDIOVERSION;  Surgeon: Alveta Aleene PARAS, MD;  Location: [facility] ENDOSCOPY;  Service: Cardiovascular;  Laterality: N/A;   CARDIOVERSION N/A 12/09/2020   Procedure: CARDIOVERSION;  Surgeon: Pietro Redell RAMAN, MD;  Location: [facility] ENDOSCOPY;  Service: Cardiovascular;   Laterality: N/A;   CHOLECYSTECTOMY     PROSTATECTOMY     REPLACEMENT TOTAL KNEE BILATERAL      Family History  Problem Relation Age of Onset   Cancer Mother    Depression Mother    Early death Mother    Cancer Father    Depression Father    Early death Father     Social History:  reports that he has never smoked. He has never been exposed to tobacco smoke. He has never used smokeless tobacco. He reports that he does not currently use alcohol after a past usage of about 14.0 standard drinks of alcohol per week. He reports that he does not use drugs.  Allergies: Allergies[1]  Medications: I have reviewed the patient's current medications.  Results for orders placed or performed during the hospital encounter of 02/22/24 (from the past 48 hours)  CBC with Differential     Status: Abnormal   Collection Time: 02/22/24  1:22 PM  Result Value Ref Range   WBC 4.1  4.0 - 10.5 K/uL   RBC 3.63 (L) 4.22 - 5.81 MIL/uL   Hemoglobin 10.7 (L) 13.0 - 17.0 g/dL   HCT 67.5 (L) 60.9 - 47.9 %   MCV 89.3 80.0 - 100.0 fL   MCH 29.5 26.0 - 34.0 pg   MCHC 33.0 30.0 - 36.0 g/dL   RDW 83.0 (H) 88.4 - 84.4 %   Platelets 289 150 - 400 K/uL   nRBC 0.0 0.0 - 0.2 %   Neutrophils Relative % 52 %   Neutro Abs 2.1 1.7 - 7.7 K/uL   Lymphocytes Relative 28 %   Lymphs Abs 1.1 0.7 - 4.0 K/uL   Monocytes Relative 13 %   Monocytes Absolute 0.6 0.1 - 1.0 K/uL   Eosinophils Relative 7 %   Eosinophils Absolute 0.3 0.0 - 0.5 K/uL   Basophils Relative 0 %   Basophils Absolute 0.0 0.0 - 0.1 K/uL   Immature Granulocytes 0 %   Abs Immature Granulocytes 0.01 0.00 - 0.07 K/uL    Comment: Performed at [facility], [address], [place], [place] [address]  Comprehensive metabolic panel     Status: Abnormal   Collection Time: 02/22/24  1:22 PM  Result Value Ref Range   Sodium 134 (L) 135 - 145 mmol/L   Potassium 3.4 (L) 3.5 - 5.1 mmol/L   Chloride 99 98 - 111 mmol/L   CO2 23 22 - 32 mmol/L    Glucose, Bld 172 (H) 70 - 99 mg/dL    Comment: Glucose reference range applies only to samples taken after fasting for at least 8 hours.   BUN 14 8 - 23 mg/dL   Creatinine, Ser 9.01 0.61 - 1.24 mg/dL   Calcium  9.2 8.9 - 10.3 mg/dL   Total Protein 5.7 (L) 6.5 - 8.1 g/dL   Albumin  3.4 (L) 3.5 - 5.0 g/dL   AST 33 15 - 41 U/L   ALT 17 0 - 44 U/L   Alkaline Phosphatase 128 (H) 38 - 126 U/L   Total Bilirubin 1.0 0.0 - 1.2 mg/dL   GFR, Estimated >39 >39 mL/min    Comment: (NOTE) Calculated using the CKD-EPI Creatinine Equation (2021)    Anion gap 12 5 - 15    Comment: Performed at [facility], [address] [place], [place] [address]  Magnesium      Status: None   Collection Time: 02/22/24  1:22 PM  Result Value Ref Range   Magnesium  1.8 1.7 - 2.4 mg/dL    Comment: Performed at [facility], [address], [place], [place] [address]  Troponin T, High Sensitivity     Status: Abnormal   Collection Time: 02/22/24  1:22 PM  Result Value Ref Range   Troponin T High Sensitivity 82 (H) 0 - 19 ng/L    Comment: (NOTE) Biotin concentrations > 1000 ng/mL falsely decrease TnT results.  Serial cardiac troponin measurements are suggested.  Refer to the Links section for chest pain algorithms and additional  guidance. Performed at [facility], [address], [place], [place] [address]   Troponin T, High Sensitivity     Status: Abnormal   Collection Time: 02/22/24  4:17 PM  Result Value Ref Range   Troponin T High Sensitivity 73 (H) 0 - 19 ng/L    Comment: (NOTE) Biotin concentrations > 1000 ng/mL falsely decrease TnT results.  Serial cardiac troponin measurements are suggested.  Refer to the Links section for chest pain algorithms and additional  guidance. Performed at [facility], [address] [place], [place] [address]   Urinalysis, Routine w reflex microscopic -Urine, Clean Catch     Status: Abnormal    Collection Time: 02/22/24  9:25 PM  Result Value Ref Range   Color, Urine AMBER (A) YELLOW    Comment: BIOCHEMICALS MAY BE AFFECTED BY COLOR   APPearance CLEAR CLEAR   Specific Gravity, Urine 1.023 1.005 - 1.030   pH 5.0 5.0 - 8.0   Glucose, UA NEGATIVE NEGATIVE mg/dL   Hgb urine dipstick NEGATIVE NEGATIVE   Bilirubin Urine NEGATIVE NEGATIVE   Ketones, ur NEGATIVE NEGATIVE mg/dL   Protein, ur 30 (A) NEGATIVE mg/dL   Nitrite NEGATIVE NEGATIVE   Leukocytes,Ua NEGATIVE NEGATIVE   RBC / HPF 0-5 0 - 5 RBC/hpf   WBC, UA 0-5 0 - 5 WBC/hpf   Bacteria, UA NONE SEEN NONE SEEN   Squamous Epithelial / HPF 0-5 0 - 5 /HPF   Mucus PRESENT     Comment: Performed at [facility], [address], [place], [place] [address]    MR Lumbar Spine W Wo Contrast Result Date: 02/22/2024 EXAM: MRI LUMBAR SPINE 02/22/2024 06:47:20 PM TECHNIQUE: Multiplanar multisequence MRI of the lumbar spine was performed without or with the administration of intravenous contrast. COMPARISON: MRI lumbar spine 10/30/2023 CLINICAL HISTORY: Low back pain, cauda equina syndrome suspected FINDINGS: SEGMENTATION: Transitional lumbosacral anatomy. The transitional lumbosacral vertebra is assumed to represent the S1 level. This conforms to the segmentation utilized on prior exams. BONES AND ALIGNMENT: Similar grade 1 retrolisthesis of L2 on L3 and improved grade 1 anterolisthesis of L4 on L5. Interval L4-L5 interbody fusion and PLIF. Hardware artifact limits assessment, but no obviouse bone marrow signal abnormality. SPINAL CORD: The conus terminates normally. SOFT TISSUES: New 3.5 x 2.4 cm fluid collection in the left psoas muscle at the L4-L5 level. More illdefined edema in the posterior paraspinal soft tissues with small fluid pockets is more typical of postoperative change. L1-L2 Disc height loss with degenerative endplate signal changes. Disc bulge with mild to moderate canal stenosis. Patent foramina. L2-L3 Disc  bulging and endplate spurring with ligamentum flavum thickening, resulting in progressive severe canal stenosis. Bilateral facet arthropathy with progressive moderate to severe left foraminal stenosis. Similar moderate right foraminal stenosis. L3-L4 Broad disc bulge with ligamentum flavum thickening and bilateral facet arthropathy. Similar versus slightly progressive moderate to severe canal stenosis. Similar moderate left foraminal stenosis. L4-L5 Interbody fusion and PLIF.  Persistent severe canal stenosis. L5-S1 Mild disc bulge with similar borderline mild foraminal stenosis. S1-S2 Transitional anatomy. No spinal canal stenosis or neural foraminal narrowing. IMPRESSION: 1. New 3.5 x 2.4 cm fluid collection in the left psoas muscle at the L4-L5 level with surrounding edema. The location is atypical for postoperative changes and is concerning for abscess. No obvious changes of discitis/osteomyelitis but hardware artifact limits assessment. Recommend correlation with infectious markers. 2. At L2-L3, progressive severe canal stenosis and moderate to severe left foraminal stenosis. Similar moderate right foraminal stenosis. 3. At L3-L4, similar versus slightly progressive moderate to severe canal stenosis. 4. Interval fusion at L4-L5 with improved alignment but persistent severe canal stenosis. 5. Transitional lumbosacral vertebra is assumed to represent the S1 level. This conforms to the segmentation utilized on prior exams. Electronically signed by: Gilmore Molt MD 02/22/2024 09:12 PM EST RP Workstation: [id number]   CT PELVIS WO CONTRAST Result Date: 02/22/2024 CLINICAL DATA:  Fall in shower. History of frequent falls over the past 6-8  weeks. EXAM: CT PELVIS WITHOUT CONTRAST TECHNIQUE: Multidetector CT imaging of the pelvis was performed following the standard protocol without intravenous contrast. RADIATION DOSE REDUCTION: This exam was performed according to the departmental dose-optimization program  which includes automated exposure control, adjustment of the mA and/or kV according to patient size and/or use of iterative reconstruction technique. COMPARISON:  Pelvic radiographs dated 02/22/2024. CT pelvis dated 10/30/2023. FINDINGS: Bones/Joint/Cartilage No acute fracture or dislocation. Femoral heads are seated within the acetabulum. 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 Bilateral greater trochanteric enthesopathy. The sacroiliac joints and pubic symphysis are anatomically aligned with degenerative changes. Partially visualized postoperative changes of the lower lumbar spine. Soft tissue and Muscles Soft tissue swelling along the left posterolateral abdominal wall. No loculated fluid collection. Intrapelvic contents Prostate implant seeds. Descending and sigmoid colonic diverticulosis. Moderate vascular calcifications again noted. No enlarged lymph nodes identified in the field of view. IMPRESSION: 1. No acute osseous abnormality. 2. Soft tissue swelling along the left posterolateral abdominal wall. No fluid collection. 3. 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 Electronically Signed   By: Harrietta Sherry M.D.   On: 02/22/2024 16:13   CT Lumbar Spine Wo Contrast Result Date: 02/22/2024 EXAM: CT OF THE LUMBAR SPINE WITHOUT CONTRAST 02/22/2024 03:28:05 PM TECHNIQUE: CT of the lumbar spine was performed without the administration of intravenous contrast. Multiplanar reformatted images are provided for review. Automated exposure control, iterative reconstruction, and/or weight based adjustment of the mA/kV was utilized to reduce the radiation dose to as low as reasonably achievable. COMPARISON: MRI and CT lumbar spine 10/30/2023. CLINICAL HISTORY: Back trauma, no prior imaging (Age >= 16y). FINDINGS: BONES AND ALIGNMENT: Normal vertebral body heights. No evidence of compression fracture or  displaced fracture in the lumbar spine. Redemonstrated transitional anatomy at the lumbosacral junction with a partially lumbarized S1 vertebral body and a near full size disc at the S1-S2 level. Interval PLIF at L4-L5 with bilateral pedicle screws and vertical interconnecting rods. Hardware is intact. Interbody spacer at L4-L5. There is no osseous fusion across the L4-L5 disc space on the current study. Diffuse osteopenia. Degenerative changes of the sacroiliac joints. Alignment: Trace degenerative retrolisthesis of L1 on L2 and L2 on L3. Similar grade 1 anterolisthesis of L4 on L5. DEGENERATIVE CHANGES: Disc space narrowing and vacuum disc phenomenon at multiple levels. Degenerative endplate osteophytes at multiple levels. Posterior osteophytes along with facet arthrosis at L2-L3 contributing to moderate to severe spinal canal stenosis. Additional disc bulge and facet arthrosis at L3-L4 resulting in at least moderate spinal canal stenosis, although streak artifact slightly limits evaluation. Streak artifact limits evaluation of the spinal canal at L4-L5; there is likely residual moderate to severe spinal canal stenosis at this level. SOFT TISSUES: Atherosclerosis of the abdominal aorta and branch vessels. Additional prominent atherosclerosis within the pelvis. Cholecystectomy clips. Diverticulosis of the partially visualized sigmoid colon. IMPRESSION: 1. No evidence of acute traumatic injury. 2. Interval PLIF at L4-L5. Hardware is intact. No osseous fusion across the L4-L5 disc space. 3. 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 4. At least moderate spinal canal stenosis at L3-L4 due to disc bulge and facet arthrosis. 5. Likely residual moderate to severe spinal canal stenosis at L4-L5, evaluation limited by streak artifact. Electronically signed by: Donnice Mania MD 02/22/2024 04:04 PM EST RP Workstation: [id number]   DG Pelvis 1-2 Views Result Date:  02/22/2024 EXAM: 1 or 2 VIEW(S) XRAY  OF THE PELVIS 02/22/2024 01:09:00 PM COMPARISON: None available. CLINICAL HISTORY: [id number] Fall [id number] FINDINGS: BONES AND JOINTS: No acute fracture. No malalignment. Lumbar spinal fusion hardware noted. Moderate bilateral hip degenerative changes. SOFT TISSUES: Surgical clips overlie the pelvis. Vascular calcifications noted. IMPRESSION: 1. No evidence of acute traumatic injury. Electronically signed by: Morgane Naveau MD 02/22/2024 01:32 PM EST RP Workstation: [id number]   DG Chest 1 View Result Date: 02/22/2024 EXAM: 1 VIEW(S) XRAY OF THE CHEST 02/22/2024 01:09:00 PM COMPARISON: 10/19/2022 CLINICAL HISTORY: Fall FINDINGS: LUNGS AND PLEURA: No focal pulmonary opacity. No pleural effusion. No pneumothorax. HEART AND MEDIASTINUM: Calcified aorta. BONES AND SOFT TISSUES: Thoracic degenerative changes. No acute osseous abnormality. IMPRESSION: 1. No acute process. Electronically signed by: Morgane Naveau MD 02/22/2024 01:31 PM EST RP Workstation: [id number]   CT Cervical Spine Wo Contrast Result Date: 02/22/2024 EXAM: CT CERVICAL SPINE WITHOUT CONTRAST 02/22/2024 12:58:30 PM TECHNIQUE: CT of the cervical spine was performed without the administration of intravenous contrast. Multiplanar reformatted images are provided for review. Automated exposure control, iterative reconstruction, and/or weight based adjustment of the mA/kV was utilized to reduce the radiation dose to as low as reasonably achievable. COMPARISON: 10/30/2023 and 12/30/2020 CLINICAL HISTORY: Neck trauma (Age >= 65y) FINDINGS: BONES AND ALIGNMENT: No acute fracture or traumatic malalignment. There is straightening of the normal cervical lordosis. There is 3 mm anterolisthesis of C7 on T1 which is increased by approximately 1 mm since the 2022 study. Additional 4 mm anterolisthesis of T1 on T2 which is increased by approximately 1.5 x 2 mm since the 2022 study. DEGENERATIVE CHANGES: There is moderate to severe  disc space narrowing at multiple levels in the cervical spine most pronounced at C5-C6 and C6-C7 overall similar to prior. There is severe disc space narrowing at T1-T2 which has increased from prior. Degenerative endplate osteophytes at multiple levels. Disc osteophyte complexes at multiple levels without high grade osseous spinal canal stenosis. There is facet arthrosis and uncovertebral hypertrophy at multiple levels. SOFT TISSUES: No prevertebral soft tissue swelling. IMPRESSION: 1. No acute cervical spine fracture. 2. 3 mm anterolisthesis of C7 on T1, increased from prior. 4 mm anterolisthesis of T1 on T2, increased since 2022. Findings likely related to increased degenerative changes. Electronically signed by: Donnice Mania MD 02/22/2024 01:21 PM EST RP Workstation: [id number]   CT Head Wo Contrast Result Date: 02/22/2024 EXAM: CT HEAD WITHOUT CONTRAST 02/22/2024 12:58:30 PM TECHNIQUE: CT of the head was performed without the administration of intravenous contrast. Automated exposure control, iterative reconstruction, and/or weight based adjustment of the mA/kV was utilized to reduce the radiation dose to as low as reasonably achievable. COMPARISON: 10/30/2023 CLINICAL HISTORY: Head trauma, minor (Age >= 65y) FINDINGS: BRAIN AND VENTRICLES: No acute hemorrhage. No evidence of acute infarct. No hydrocephalus. No extra-axial collection. No mass effect or midline shift. Patchy and confluent decreased attenuation throughout the deep and periventricular white matter of the cerebral hemispheres bilaterally, compatible with chronic microvascular ischemic disease. Cerebral ventricle sizes concordant with degree of cerebral volume loss. Atherosclerotic calcifications within the cavernous internal carotid arteries. ORBITS: No acute abnormality. Left lens replacement. SINUSES: No acute abnormality. SOFT TISSUES AND SKULL: No acute soft tissue abnormality. No skull fracture. IMPRESSION: 1. No acute intracranial  abnormality. Electronically signed by: Donnice Mania MD 02/22/2024 01:14 PM EST RP Workstation: [id number]    Review of Systems  Constitutional:  Positive for activity change.  Musculoskeletal:  Positive for back pain and gait problem.  All other systems reviewed and are negative.  Blood  pressure (!) 161/73, pulse 75, temperature 98.3 F (36.8 C), temperature source Oral, resp. rate 18, height 5' 8 (1.727 m), weight 79.4 kg, SpO2 98%. Physical Exam  Assessment/Plan: Psoas abscess in early stages on left side at L4-L5.  Plan admit for IV antibiotic therapy blood cultures urine cultures and possible aspiration of psoas abscess.  Victory PARAS Khailee Mick 02/23/2024, 12:23 AM         [1]  Allergies Allergen Reactions   Amlodipine  Other (See Comments)    Severe edema, chest pain, nausea    Chlorhexidine  Gluconate Itching    Only itching with wipes, not with soap

## 2024-02-23 NOTE — Progress Notes (Signed)
 0141: Pt arrived to rm 1335 from ED. Upon my admission assessment noted scattered rash on pts chest and back. Pt reported  mild itching to chest and upper back. Pt denies SOB, throat tightness or pain. No blister, flushing or hives noted. VS: BP 158/71, HR 76, RR 17, temp 98.1, SpO2 98% RA. Vancomycin  started 2346. Vancomycin  infusion immediately stopped at 0141. Janese Horns, NP. Administered po Benadryl  per providers orders. Plan of care ongoing.

## 2024-02-23 NOTE — Progress Notes (Signed)
 PT Cancellation Note  Patient Details Name: Charles Lloyd. MRN: [id number] DOB: 1940/06/30   Cancelled Treatment:     PT order received and eval attempted am and pm but deferred at request of RN 2* episodes of markedly increased confusion.  Will follow.   Tempie Gibeault 02/23/2024, 4:11 PM

## 2024-02-23 NOTE — Plan of Care (Signed)
" °  Problem: Fluid Volume: Goal: Ability to maintain a balanced intake and output will improve Outcome: Not Progressing   Problem: Education: Goal: Knowledge of General Education information will improve Description: Including pain rating scale, medication(s)/side effects and non-pharmacologic comfort measures Outcome: Not Progressing   Problem: Activity: Goal: Risk for activity intolerance will decrease Outcome: Not Progressing   Problem: Coping: Goal: Level of anxiety will decrease Outcome: Not Progressing   "

## 2024-02-23 NOTE — Plan of Care (Signed)
" °  Problem: Safety: Goal: Ability to remain free from injury will improve Outcome: Progressing   Problem: Pain Managment: Goal: General experience of comfort will improve and/or be controlled Outcome: Progressing   Problem: Elimination: Goal: Will not experience complications related to urinary retention Outcome: Progressing   Problem: Metabolic: Goal: Ability to maintain appropriate glucose levels will improve Outcome: Progressing   "

## 2024-02-23 NOTE — Progress Notes (Signed)
" ° ° ° °  Patient Name: Charles Lloyd.           DOB: 07/24/1940  MRN: [id number]      Admission Date: 02/22/2024  Attending Provider: Charlton Evalene GORMAN, MD  Primary Diagnosis: Psoas abscess, left (HCC)   Level of care: Med-Surg   OVERNIGHT EVENT   Notified by bedside RN of patient c/o new onset itching with scattered rash across his chest and back. See below.  Skin change occurred after vancomycin  administration. No hives or swelling noted. No facial edema.                Plan: Benadryl  Switching from vanc to zyvox    Rosezella Kronick, DNP, ACNPC- [facility]         "

## 2024-02-23 NOTE — Consult Note (Signed)
 "  Chief Complaint: Back pain, post op left psoas fluid collection concerning for abscess; referred for image guided aspiration of left psoas fluid collection  Referring Provider(s): Naasz,H/Elsner ,H  Supervising Physician: Jennefer Rover  Patient Status: [facility] - In-pt  History of Present Illness: Charles Lloyd. is an 83 y.o. male  with past medical history significant for hypertension, hyperlipidemia, type 2 diabetes mellitus, atrial fibrillation on Eliquis  (last dose 12/27 am) and L4-5 spondylolisthesis status post L4-5 trans psoas lumbar interbody fusion left-sided approach on 02/06/2024, now presenting with ongoing leg weakness, pain, and recurrent falls.Patient reported that he has been falling which he attributes to severe pain in the hips and the legs, left greater than right, as well as tremor and leg weakness.  He recently fell in the shower and hit his head.  On presentation, he was hemodynamically stable.Imaging in the ED is most notable for new 3.5 x 2.4 cm fluid collection in the left psoas muscle noted on MRI and concerning for abscess. Neurosurgery (Dr. Colon) was consulted by the ED physician and recommended IV antibiotics and IR consultation for possible aspiration.    Patient is Full Code  Past Medical History:  Diagnosis Date   Acute tracheobronchitis 01/05/2019   B12 deficiency    Basal cell carcinoma (BCC) of left side of nose 01/28/2018   Cancer (HCC)    Cardiomyopathy (HCC) 03/10/2018   With chronic atrial fibrillation   CHF (congestive heart failure) (HCC) 04/16/2013   Functional class II, ejection fraction 35-40%  Formatting of this note might be different from the original. Functional class II, ejection fraction 35-40%   Chronic anticoagulation    Chronic diastolic (congestive) heart failure (HCC) 04/16/2013   Functional class II, ejection fraction 35-40%  Last Assessment & Plan:  Clinically stable Volume well controlled meds reviewed   Chronic diastolic CHF  (congestive heart failure) (HCC) 04/16/2013   Functional class II, ejection fraction 35-40%  Last Assessment & Plan:  Clinically stable Volume well controlled meds reviewed   Colon polyps 04/16/2013   Coronary artery disease involving native coronary artery of native heart without angina pectoris 04/05/2022   Myoview  05/25/2019: EF 60, normal perfusion, low risk CCTA (AF protocol) 04/25/20: CAC score 4242 (99th percentile)   Diabetic neuropathy (HCC)    Difficult intubation    pt reports difficult intubation during surgery in [place] in 2015   Dysrhythmia    A.Fib   Eosinophil count raised 02/17/2019   Essential hypertension 01/06/2010   Last Assessment & Plan:  Well controlled Continue med management   Gout    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  Steroid usage was discontinued several months ago.  Follow up with Dermatology as planned.   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   Last Assessment & Plan:  Id following ?ongoing abx managementy reported Request records   Infection of prosthetic right knee joint    Insomnia 03/04/2012   Grief with loss of wife 02/20/12  Last Assessment & Plan:  Improved sx  contineu xanax prn Se discussed   Lesion of liver 04/20/2019   -On cardiac CT 02/2019   Mild reactive airways  disease    Neoplasm of prostate 04/16/2013   Neoplasm of prostate, 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 Transthoracic Echocardiogram-03/10/2013-[facility]: Normal left ventricular wall thickness and cavity size.  Global left ventricular systolic function is moderately reduced.  The estimated ejection fraction is 35-40%.  The left atrium is moderately enlarged.  The right atrium is mildly enlarged.  No significant valvular    Overweight (BMI 25.0-29.9) 10/22/2016   Persistent atrial fibrillation (HCC) 04/16/2013   Last Assessment & Plan:  Rate controlled Continue med management eliquis  for stroke preventino  Formatting of this note might be different from the original.  Drug  HX Current Rx Pre-ABL inefficacy Pre-ABL intolerant Post-ABL inefficacy Post-ABL intolerant max dose/24h M/Y end comments  sotalol                  dofetilide                  flecainide                   propafenone                  am   S/P TKR (total knee replacement), bilateral    Secondary hypercoagulable state 04/09/2019   Tinea cruris 04/16/2013   Type 2 diabetes mellitus with diabetic polyneuropathy, without long-term current use of insulin  (HCC) 04/16/2013   Last Assessment & Plan:  Labs today Pt reports well controlled on ambulatory monitoring   Vitamin D  deficiency 07/25/2018    Past Surgical History:  Procedure Laterality Date   ANTERIOR LAT LUMBAR FUSION N/A 02/06/2024   Procedure: ANTERIOR PRONE LATERAL LUMBAR FUSION LUMBAR FOUR-LUMBAR FIVE;  Surgeon: Darnella Dorn SAUNDERS, MD;  Location: [facility] OR;  Service: Neurosurgery;  Laterality: N/A;   ATRIAL FIBRILLATION ABLATION N/A 03/12/2019   Procedure: ATRIAL FIBRILLATION ABLATION;  Surgeon: Inocencio Soyla Lunger, MD;  Location: [facility] INVASIVE CV LAB;  Service: Cardiovascular;  Laterality: N/A;   ATRIAL FIBRILLATION ABLATION N/A 04/29/2020   Procedure: ATRIAL FIBRILLATION ABLATION;  Surgeon: Inocencio Soyla Lunger, MD;  Location: [facility] INVASIVE CV LAB;  Service:  Cardiovascular;  Laterality: N/A;   CARDIAC ELECTROPHYSIOLOGY STUDY AND ABLATION     CARDIOVERSION     CARDIOVERSION N/A 10/10/2018   Procedure: CARDIOVERSION;  Surgeon: Delford Maude BROCKS, MD;  Location: [facility] ENDOSCOPY;  Service: Cardiovascular;  Laterality: N/A;   CARDIOVERSION N/A 05/18/2020   Procedure: CARDIOVERSION;  Surgeon: Alveta Aleene PARAS, MD;  Location: [facility] ENDOSCOPY;  Service: Cardiovascular;  Laterality: N/A;   CARDIOVERSION N/A 12/09/2020   Procedure: CARDIOVERSION;  Surgeon: Pietro Redell RAMAN, MD;  Location: [facility] ENDOSCOPY;  Service: Cardiovascular;  Laterality: N/A;   CHOLECYSTECTOMY     PROSTATECTOMY     REPLACEMENT TOTAL KNEE BILATERAL      Allergies: Amlodipine  and Chlorhexidine  gluconate  Medications: Prior to Admission medications  Medication Sig Start Date End Date Taking? Authorizing Provider  apixaban  (ELIQUIS ) 5 MG TABS tablet Take 1 tablet (5 mg total) by mouth 2 (two) times daily. 08/02/23  Yes Theophilus Andrews, Tully GRADE, MD  atorvastatin  (LIPITOR) 20 MG tablet TAKE 1 TABLET DAILY (PLEASE SCHEDULE A PHYSICAL FOR MORE REFILLS) 12/09/23  Yes Panosh, Apolinar POUR, MD  ACCU-CHEK AVIVA PLUS test strip 1 EACH BY OTHER ROUTE DAILY. DX E11.9 08/28/21   Theophilus Andrews, Tully GRADE, MD  allopurinol  (ZYLOPRIM ) 300 MG tablet TAKE 1 TABLET BY MOUTH EVERY DAY 01/13/24   Theophilus Andrews, Tully GRADE, MD  carvedilol  (COREG ) 3.125 MG tablet TAKE 1 TABLET BY MOUTH TWICE A DAY 12/31/23   Theophilus Andrews, Tully GRADE, MD  Continuous Blood Gluc Receiver (FREESTYLE LIBRE 2 READER) DEVI 1 each by Does not apply route daily. 08/28/21   Theophilus Andrews, Tully GRADE, MD  Continuous Blood Gluc Sensor (FREESTYLE LIBRE 2 SENSOR) MISC 1 each by Does not apply route daily. 08/28/21   Theophilus Andrews, Tully GRADE, MD  cyclobenzaprine  (FLEXERIL ) 10 MG tablet Take 1 tablet (10 mg total) by mouth 3 (three) times daily as needed for muscle spasms. 02/07/24   Darnella Dorn SAUNDERS, MD  docusate sodium  (COLACE) 100 MG capsule Take 1  capsule (100 mg total) by mouth daily as needed (please take daily while on narcotic pain medication). 02/07/24 02/06/25  Darnella Dorn SAUNDERS, MD  flecainide  (TAMBOCOR ) 100 MG tablet TAKE 1 TABLET BY MOUTH TWICE A DAY 10/24/23   Camnitz, Soyla Lunger, MD  fluticasone  (FLONASE ) 50 MCG/ACT nasal spray Place 2 sprays into both nostrils daily. 01/07/19   Rosario Leatrice FERNS, MD  gabapentin  (NEURONTIN ) 300 MG capsule Take one tablet in the morning, one tablet in the afternoon, and 2 tablets at bedtime 12/12/23   Theophilus Andrews, Tully GRADE, MD  hydrALAZINE  (APRESOLINE ) 25 MG tablet TAKE 1 TABLET (25 MG TOTAL) BY MOUTH 4 (FOUR) TIMES DAILY. Patient taking differently: Take 25 mg by mouth in the morning and at bedtime. 10/22/23   Krasowski, Robert J, MD  montelukast  (SINGULAIR ) 10 MG tablet Take 10 mg by mouth in the morning.    [provider]  oxyCODONE  (OXY IR/ROXICODONE ) 5 MG immediate release tablet Take 1 tablet (5 mg total) by mouth every 6 (six) hours as needed for moderate pain (pain score 4-6). 02/07/24   Darnella Dorn SAUNDERS, MD  potassium chloride  SA (KLOR-CON  M20) 20 MEQ tablet Take 1 tablet (20 mEq total) by mouth daily. 09/18/23   Theophilus Andrews, Tully GRADE, MD  RESTASIS  0.05 % ophthalmic emulsion Place 1 drop into both eyes in the morning. 12/09/19   [provider]  torsemide  (DEMADEX ) 20 MG tablet Take 1 tablet (20 mg total) by mouth 2 (two) times daily. 05/03/23   Bernie Lamar PARAS, MD     Family History  Problem Relation Age of Onset   Cancer Mother    Depression Mother    Early death Mother    Cancer Father    Depression Father    Early death Father     Social History   Socioeconomic History   Marital status: Widowed    Spouse name: Not on file   Number of children: Not on file   Years of education: Not on file   Highest education level: Not on file  Occupational History   Not on file  Tobacco Use   Smoking status: Never    Passive exposure: Never   Smokeless  tobacco: Never  Vaping Use   Vaping status: Never Used  Substance and Sexual Activity   Alcohol use: Not Currently    Alcohol/week: 14.0 standard drinks of alcohol    Types: 14 Shots of liquor per week    Comment: sips on rum and has brandy before bed nightly   Drug use: Never   Sexual activity: Not Currently  Other Topics Concern   Not on file  Social History Narrative   Not on file   Social Drivers of Health   Tobacco Use: Low Risk (02/22/2024)   Patient History    Smoking Tobacco Use: Never    Smokeless Tobacco Use: Never    Passive Exposure: Never  Financial Resource Strain: Low Risk (12/21/2022)   Overall Financial Resource Strain (CARDIA)    Difficulty of Paying Living Expenses: Not hard at all  Food Insecurity: No Food Insecurity (02/23/2024)   Epic    Worried About Radiation Protection Practitioner of Food in the Last Year: Never true    Ran Out of Food in the Last Year: Never true  Transportation Needs: No Transportation Needs (02/23/2024)   Epic    Lack of Transportation (Medical): No    Lack of Transportation (Non-Medical): No  Physical Activity: Inactive (12/21/2022)   Exercise Vital Sign    Days of Exercise per Week: 0 days    Minutes of Exercise per Session: 0 min  Stress: No Stress Concern Present (12/21/2022)   [employer] of Occupational Health - Occupational Stress Questionnaire    Feeling of Stress : Not at all  Social Connections: Socially Isolated (02/23/2024)   Social Connection and Isolation Panel    Frequency of Communication with Friends and Family: More than three times a week    Frequency of Social Gatherings with Friends and Family: More than three times a week    Attends Religious Services: Never    Database Administrator or Organizations: No    Attends Banker Meetings: Never    Marital Status: Widowed  Depression (PHQ2-9): Low Risk (02/11/2024)   Depression (PHQ2-9)    PHQ-2 Score: 0  Alcohol Screen: Low Risk (12/21/2022)   Alcohol  Screen    Last Alcohol Screening Score (AUDIT): 4  Housing: Low Risk (02/23/2024)   Epic    Unable to Pay for Housing in the Last Year: No    Number of Times Moved in the Last Year: 0    Homeless in the Last Year: No  Utilities: Not At Risk (02/23/2024)   Epic    Threatened with loss of utilities: No  Health Literacy: Adequate Health Literacy (12/21/2022)   B1300 Health Literacy    Frequency of need for help with medical instructions: Never       Review of Systems see above: Currently denies fever, headache, chest pain, dyspnea, cough, abdominal pain, nausea, vomiting or bleeding.  Vital Signs: BP (!) 151/82 (BP Location: Left Arm)   Pulse 76   Temp 98.3 F (36.8 C) (Oral)   Resp 17   Ht 5' 8 (1.727 m)   Wt 175 lb (79.4 kg)   SpO2 96%   BMI 26.61 kg/m   Advance Care Plan: No documents on file   Physical Exam patient awake, answers simple questions/following commands okay but has had some recent confusion; chest clear to auscultation bilaterally.  Heart currently with regular rate and rhythm.  Abdomen soft, positive bowel sounds, nontender. Bilat lower extremity edema noted  Imaging: MR Lumbar Spine W Wo Contrast Result Date: 02/22/2024 EXAM: MRI LUMBAR SPINE 02/22/2024 06:47:20 PM TECHNIQUE: Multiplanar multisequence MRI of the lumbar spine was performed without or with the administration of intravenous contrast. COMPARISON: MRI lumbar spine 10/30/2023 CLINICAL HISTORY: Low back pain, cauda equina syndrome suspected FINDINGS: SEGMENTATION: Transitional lumbosacral anatomy. The transitional lumbosacral vertebra is assumed to represent the S1 level. This conforms to the segmentation utilized on prior exams.  BONES AND ALIGNMENT: Similar grade 1 retrolisthesis of L2 on L3 and improved grade 1 anterolisthesis of L4 on L5. Interval L4-L5 interbody fusion and PLIF. Hardware artifact limits assessment, but no obviouse bone marrow signal abnormality. SPINAL CORD: The conus terminates  normally. SOFT TISSUES: New 3.5 x 2.4 cm fluid collection in the left psoas muscle at the L4-L5 level. More illdefined edema in the posterior paraspinal soft tissues with small fluid pockets is more typical of postoperative change. L1-L2 Disc height loss with degenerative endplate signal changes. Disc bulge with mild to moderate canal stenosis. Patent foramina. L2-L3 Disc bulging and endplate spurring with ligamentum flavum thickening, resulting in progressive severe canal stenosis. Bilateral facet arthropathy with progressive moderate to severe left foraminal stenosis. Similar moderate right foraminal stenosis. L3-L4 Broad disc bulge with ligamentum flavum thickening and bilateral facet arthropathy. Similar versus slightly progressive moderate to severe canal stenosis. Similar moderate left foraminal stenosis. L4-L5 Interbody fusion and PLIF.  Persistent severe canal stenosis. L5-S1 Mild disc bulge with similar borderline mild foraminal stenosis. S1-S2 Transitional anatomy. No spinal canal stenosis or neural foraminal narrowing. IMPRESSION: 1. New 3.5 x 2.4 cm fluid collection in the left psoas muscle at the L4-L5 level with surrounding edema. The location is atypical for postoperative changes and is concerning for abscess. No obvious changes of discitis/osteomyelitis but hardware artifact limits assessment. Recommend correlation with infectious markers. 2. At L2-L3, progressive severe canal stenosis and moderate to severe left foraminal stenosis. Similar moderate right foraminal stenosis. 3. At L3-L4, similar versus slightly progressive moderate to severe canal stenosis. 4. Interval fusion at L4-L5 with improved alignment but persistent severe canal stenosis. 5. Transitional lumbosacral vertebra is assumed to represent the S1 level. This conforms to the segmentation utilized on prior exams. Electronically signed by: Gilmore Molt MD 02/22/2024 09:12 PM EST RP Workstation: [id number]   CT PELVIS WO  CONTRAST Result Date: 02/22/2024 CLINICAL DATA:  Fall in shower. History of frequent falls over the past 6-8 weeks. EXAM: CT PELVIS WITHOUT CONTRAST TECHNIQUE: Multidetector CT imaging of the pelvis was performed following the standard protocol without intravenous contrast. RADIATION DOSE REDUCTION: This exam was performed according to the departmental dose-optimization program which includes automated exposure control, adjustment of the mA and/or kV according to patient size and/or use of iterative reconstruction technique. COMPARISON:  Pelvic radiographs dated 02/22/2024. CT pelvis dated 10/30/2023. FINDINGS: Bones/Joint/Cartilage No acute fracture or dislocation. Femoral heads are seated within the acetabulum. 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 Bilateral greater trochanteric enthesopathy. The sacroiliac joints and pubic symphysis are anatomically aligned with degenerative changes. Partially visualized postoperative changes of the lower lumbar spine. Soft tissue and Muscles Soft tissue swelling along the left posterolateral abdominal wall. No loculated fluid collection. Intrapelvic contents Prostate implant seeds. Descending and sigmoid colonic diverticulosis. Moderate vascular calcifications again noted. No enlarged lymph nodes identified in the field of view. IMPRESSION: 1. No acute osseous abnormality. 2. Soft tissue swelling along the left posterolateral abdominal wall. No fluid collection. 3. 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 Electronically Signed   By: Harrietta Sherry M.D.   On: 02/22/2024 16:13   CT Lumbar Spine Wo Contrast Result Date: 02/22/2024 EXAM: CT OF THE LUMBAR SPINE WITHOUT CONTRAST 02/22/2024 03:28:05 PM TECHNIQUE: CT of the lumbar spine was performed without the administration of intravenous contrast. Multiplanar reformatted images are provided for review.  Automated exposure control, iterative reconstruction, and/or weight based adjustment of the mA/kV was utilized to  reduce the radiation dose to as low as reasonably achievable. COMPARISON: MRI and CT lumbar spine 10/30/2023. CLINICAL HISTORY: Back trauma, no prior imaging (Age >= 16y). FINDINGS: BONES AND ALIGNMENT: Normal vertebral body heights. No evidence of compression fracture or displaced fracture in the lumbar spine. Redemonstrated transitional anatomy at the lumbosacral junction with a partially lumbarized S1 vertebral body and a near full size disc at the S1-S2 level. Interval PLIF at L4-L5 with bilateral pedicle screws and vertical interconnecting rods. Hardware is intact. Interbody spacer at L4-L5. There is no osseous fusion across the L4-L5 disc space on the current study. Diffuse osteopenia. Degenerative changes of the sacroiliac joints. Alignment: Trace degenerative retrolisthesis of L1 on L2 and L2 on L3. Similar grade 1 anterolisthesis of L4 on L5. DEGENERATIVE CHANGES: Disc space narrowing and vacuum disc phenomenon at multiple levels. Degenerative endplate osteophytes at multiple levels. Posterior osteophytes along with facet arthrosis at L2-L3 contributing to moderate to severe spinal canal stenosis. Additional disc bulge and facet arthrosis at L3-L4 resulting in at least moderate spinal canal stenosis, although streak artifact slightly limits evaluation. Streak artifact limits evaluation of the spinal canal at L4-L5; there is likely residual moderate to severe spinal canal stenosis at this level. SOFT TISSUES: Atherosclerosis of the abdominal aorta and branch vessels. Additional prominent atherosclerosis within the pelvis. Cholecystectomy clips. Diverticulosis of the partially visualized sigmoid colon. IMPRESSION: 1. No evidence of acute traumatic injury. 2. Interval PLIF at L4-L5. Hardware is intact. No osseous fusion across the L4-L5 disc space. 3. Moderate to severe spinal canal stenosis at  L2-L3 due to posterior osteophytes and facet arthrosis. 4. At least moderate spinal canal stenosis at L3-L4 due to disc bulge and facet arthrosis. 5. Likely residual moderate to severe spinal canal stenosis at L4-L5, evaluation limited by streak artifact. Electronically signed by: Donnice Mania MD 02/22/2024 04:04 PM EST RP Workstation: [id number]   DG Pelvis 1-2 Views Result Date: 02/22/2024 EXAM: 1 or 2 VIEW(S) XRAY OF THE PELVIS 02/22/2024 01:09:00 PM COMPARISON: None available. CLINICAL HISTORY: [id number] Fall [id number] FINDINGS: BONES AND JOINTS: No acute fracture. No malalignment. Lumbar spinal fusion hardware noted. Moderate bilateral hip degenerative changes. SOFT TISSUES: Surgical clips overlie the pelvis. Vascular calcifications noted. IMPRESSION: 1. No evidence of acute traumatic injury. Electronically signed by: Morgane Naveau MD 02/22/2024 01:32 PM EST RP Workstation: [id number]   DG Chest 1 View Result Date: 02/22/2024 EXAM: 1 VIEW(S) XRAY OF THE CHEST 02/22/2024 01:09:00 PM COMPARISON: 10/19/2022 CLINICAL HISTORY: Fall FINDINGS: LUNGS AND PLEURA: No focal pulmonary opacity. No pleural effusion. No pneumothorax. HEART AND MEDIASTINUM: Calcified aorta. BONES AND SOFT TISSUES: Thoracic degenerative changes. No acute osseous abnormality. IMPRESSION: 1. No acute process. Electronically signed by: Morgane Naveau MD 02/22/2024 01:31 PM EST RP Workstation: [id number]   CT Cervical Spine Wo Contrast Result Date: 02/22/2024 EXAM: CT CERVICAL SPINE WITHOUT CONTRAST 02/22/2024 12:58:30 PM TECHNIQUE: CT of the cervical spine was performed without the administration of intravenous contrast. Multiplanar reformatted images are provided for review. Automated exposure control, iterative reconstruction, and/or weight based adjustment of the mA/kV was utilized to reduce the radiation dose to as low as reasonably achievable. COMPARISON: 10/30/2023 and 12/30/2020 CLINICAL HISTORY: Neck trauma (Age >= 65y) FINDINGS:  BONES AND ALIGNMENT: No acute fracture or traumatic malalignment. There is straightening of the normal cervical lordosis. There is 3 mm anterolisthesis of C7 on T1 which is increased by approximately 1 mm since the 2022 study. Additional 4 mm anterolisthesis of T1 on T2 which is increased by approximately  1.5 x 2 mm since the 2022 study. DEGENERATIVE CHANGES: There is moderate to severe disc space narrowing at multiple levels in the cervical spine most pronounced at C5-C6 and C6-C7 overall similar to prior. There is severe disc space narrowing at T1-T2 which has increased from prior. Degenerative endplate osteophytes at multiple levels. Disc osteophyte complexes at multiple levels without high grade osseous spinal canal stenosis. There is facet arthrosis and uncovertebral hypertrophy at multiple levels. SOFT TISSUES: No prevertebral soft tissue swelling. IMPRESSION: 1. No acute cervical spine fracture. 2. 3 mm anterolisthesis of C7 on T1, increased from prior. 4 mm anterolisthesis of T1 on T2, increased since 2022. Findings likely related to increased degenerative changes. Electronically signed by: Donnice Mania MD 02/22/2024 01:21 PM EST RP Workstation: [id number]   CT Head Wo Contrast Result Date: 02/22/2024 EXAM: CT HEAD WITHOUT CONTRAST 02/22/2024 12:58:30 PM TECHNIQUE: CT of the head was performed without the administration of intravenous contrast. Automated exposure control, iterative reconstruction, and/or weight based adjustment of the mA/kV was utilized to reduce the radiation dose to as low as reasonably achievable. COMPARISON: 10/30/2023 CLINICAL HISTORY: Head trauma, minor (Age >= 65y) FINDINGS: BRAIN AND VENTRICLES: No acute hemorrhage. No evidence of acute infarct. No hydrocephalus. No extra-axial collection. No mass effect or midline shift. Patchy and confluent decreased attenuation throughout the deep and periventricular white matter of the cerebral hemispheres bilaterally, compatible with chronic  microvascular ischemic disease. Cerebral ventricle sizes concordant with degree of cerebral volume loss. Atherosclerotic calcifications within the cavernous internal carotid arteries. ORBITS: No acute abnormality. Left lens replacement. SINUSES: No acute abnormality. SOFT TISSUES AND SKULL: No acute soft tissue abnormality. No skull fracture. IMPRESSION: 1. No acute intracranial abnormality. Electronically signed by: Donnice Mania MD 02/22/2024 01:14 PM EST RP Workstation: [id number]   DG Lumbar Spine 2-3 Views Result Date: 02/06/2024 EXAM: 2 or 3 VIEW(S) XRAY OF THE LUMBAR SPINE 02/06/2024 03:41:00 PM COMPARISON: MRI 10/30/2023. CLINICAL HISTORY: 461500 Elective surgery 461500 Elective surgery. FINDINGS: LUMBAR SPINE: BONES: Vertebral body heights are maintained. Status post surgical posterior fusion of L4-L5 with bilateral interpedicular screw placement and interbody fusion. Mild grade 1 retrolisthesis of L2-L3 is noted. DISCS AND DEGENERATIVE CHANGES: Severe degenerative disc disease is noted at all levels of the lumbar spine. SOFT TISSUES: No acute abnormality. IMPRESSION: 1. Status post posterior fusion of L4-5 with bilateral pedicle screw fixation and interbody fusion. 2. Severe multilevel lumbar degenerative disc disease. 3. Mild grade 1 retrolisthesis at L2-3. Electronically signed by: Lynwood Seip MD 02/06/2024 06:27 PM EST RP Workstation: [id number]   DG Lumbar Spine 2-3 Views Result Date: 02/06/2024 CLINICAL DATA:  Elective surgery. EXAM: LUMBAR SPINE - 2-3 VIEW COMPARISON:  None Available. FINDINGS: Thirteen intraoperative fluoroscopic spot images provided. The total fluoroscopic time is 128.5 seconds with relative air Karma of 106.97 mGy. L4-L5 fusion noted. IMPRESSION: Intraoperative fluoroscopic spot images of the lumbar spine. Electronically Signed   By: Vanetta Chou M.D.   On: 02/06/2024 17:56   DG C-Arm 1-60 Min-No Report Result Date: 02/06/2024 Fluoroscopy was utilized by the  requesting physician.  No radiographic interpretation.   DG C-Arm 1-60 Min-No Report Result Date: 02/06/2024 Fluoroscopy was utilized by the requesting physician.  No radiographic interpretation.   DG C-Arm 1-60 Min-No Report Result Date: 02/06/2024 Fluoroscopy was utilized by the requesting physician.  No radiographic interpretation.   DG O-ARM IMAGE ONLY/NO REPORT Result Date: 02/06/2024 There is no Radiologist interpretation  for this exam.   Labs:  CBC: Recent Labs  10/30/23 1124 02/05/24 1140 02/22/24 1322 02/23/24 0319  WBC 3.5* 3.7* 4.1 3.2*  HGB 10.5* 10.7* 10.7* 9.3*  HCT 31.8* 32.9* 32.4* 27.9*  PLT 175 182 289 254    COAGS: No results for input(s): INR, APTT in the last 8760 hours.  BMP: Recent Labs    10/30/23 1124 02/05/24 1140 02/22/24 1322 02/23/24 0319  NA 137 143 134* 133*  K 3.6 3.6 3.4* 3.6  CL 102 107 99 99  CO2 24 26 23  21*  GLUCOSE 186* 105* 172* 106*  BUN 12 25* 14 12  CALCIUM  9.1 8.9 9.2 9.0  CREATININE 0.91 1.39* 0.98 0.86  GFRNONAA >60 51* >60 >60    LIVER FUNCTION TESTS: Recent Labs    02/05/24 1140 02/22/24 1322  BILITOT 1.2 1.0  AST 28 33  ALT 20 17  ALKPHOS 76 128*  PROT 5.4* 5.7*  ALBUMIN  3.0* 3.4*    TUMOR MARKERS: No results for input(s): AFPTM, CEA, CA199, CHROMGRNA in the last 8760 hours.  Assessment and Plan: 83 y.o. male  with past medical history significant for hypertension, hyperlipidemia, type 2 diabetes mellitus, atrial fibrillation on Eliquis  (last dose 12/27 am) and L4-5 spondylolisthesis status post L4-5 trans psoas lumbar interbody fusion left-sided approach on 02/06/2024, now presenting with ongoing leg weakness, pain, and recurrent falls.Patient reported that he has been falling which he attributes to severe pain in the hips and the legs, left greater than right, as well as tremor and leg weakness.  He recently fell in the shower and hit his head.  On presentation, he was hemodynamically  stable.Imaging in the ED is most notable for new 3.5 x 2.4 cm fluid collection in the left psoas muscle noted on MRI and concerning for abscess. Neurosurgery (Dr. Colon) was consulted by the ED physician and recommended IV antibiotics and IR consultation for possible aspiration.  Latest imaging studies were reviewed by Dr. Jennefer.  Details/risks of image guided left psoas fluid collection aspiration, including but not limited to, internal bleeding, infection, injury to adjacent structures discussed with patient/son with their apparent understanding and consent.  Procedure scheduled for 12/29.   Thank you for allowing our service to participate in Charles Lloyd. 's care.  Electronically Signed: D. Franky Rakers, PA-C   02/23/2024, 10:49 AM      I spent a total of  25 minutes   in face to face in clinical consultation, greater than 50% of which was counseling/coordinating care for image guided left psoas fluid collection aspiration   "

## 2024-02-23 NOTE — Progress Notes (Signed)
 " PROGRESS NOTE  Charles Lloyd Charles Lloyd.  FMW:[id number] DOB: 1940/03/27 DOA: 02/22/2024 PCP: Theophilus Andrews, Tully GRADE, MD   Brief Narrative: Charles Capistran. is an 83 y.o. male with medical history significant for hypertension, hyperlipidemia, type 2 diabetes mellitus, atrial fibrillation on Eliquis , and L4-5 spondylolisthesis status post L4-5 trans psoas lumbar interbody fusion left-sided approach on 02/06/2024, now presenting with ongoing leg weakness, pain, and recurrent falls.Patient reported that he has been falling which he attributes to severe pain in the hips and the legs, left greater than right, as well as tremor and leg weakness.  He fell in the shower tonight and hit his head.  On presentation, he was hemodynamically stable.Imaging in the ED is most notable for new 3.5 x 2.4 cm fluid collection in the left psoas muscle noted on MRI and concerning for abscess. Neurosurgery (Dr. Colon) was consulted by the ED physician and recommended IV antibiotics and IR consultation for possible aspiration.  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 Left psoas abscess  - continue broad-spectrum antibiotics, consulted IR for aspiration  .  Follow-up cultures   2. Weakness and falls  - Patient reports multiple falls in last few weeks that he attributes to leg weakness, pain, and tremors    -  consult PT.  TSH, vitamin B12, folate normal   3. Atrial fibrillation  - Hold Eliquis  pending IR and neurosurgery consultation, continue flecainide  and Coreg  as tolerated  .  On normal sinus rhythm at present   4. Hypertension  - Continue Coreg  and hydralazine      5. Type II DM  - A1c was 5.9% earlier this month   - Check CBGs and use low-intensity SSI if needed     6. Chronic HFpEF  - Appears compensated    7. Asthma  - Not in exacerbation  - Continue Singulair  and as-needed albuterol    8.  Confusion New problem.  Report of taking 2-3 drinks a day.  Started on CIWA.  Could be delirium.  Continue delirium precaution, frequent reorientation.  CT head negative for acute findings.  Minimize narcotics, sedatives             DVT prophylaxis:SCDs Start: 02/23/24 0010     Code Status: Full Code  Family Communication: Son at bedside  Patient status: Inpatient  Patient is from : Home  Anticipated discharge to: Home  Estimated DC date:after  Full workup,   Consultants: Neurosurgery  Procedures: None yet  Antimicrobials:  Anti-infectives (From admission, onward)    Start     Dose/Rate Route Frequency Ordered Stop   02/23/24 2200  vancomycin  (VANCOREADY) IVPB 1500 mg/300 mL  Status:  Discontinued        1,500 mg 150 mL/hr over 120 Minutes Intravenous Every 24 hours 02/22/24 2348 02/23/24 0242   02/23/24 1000  linezolid  (ZYVOX ) IVPB 600 mg       Note to Pharmacy: Pt developed rash shortly after vancomycin  administration.   600 mg 300 mL/hr over 60 Minutes Intravenous Every 12 hours 02/23/24 0242     02/23/24 0800  piperacillin -tazobactam (ZOSYN ) IVPB 3.375 g        3.375 g 12.5 mL/hr over 240 Minutes Intravenous Every 8 hours  02/22/24 2348     02/22/24 2315  piperacillin -tazobactam (ZOSYN ) IVPB 3.375 g        3.375 g 100 mL/hr over 30 Minutes Intravenous  Once 02/22/24 2306 02/23/24 0013   02/22/24 2315  vancomycin  (VANCOREADY) IVPB 1500 mg/300 mL        1,500 mg 150 mL/hr over 120 Minutes Intravenous  Once 02/22/24 2306 02/23/24 0141       Subjective: Patient seen and examined at bedside.  Sitting on the edge of the bed.  Does not look any acute distress.  Son is concerned about new onset of confusion.  He does not have any focal deficits.  During my evaluation, he was  alert and oriented.  Complains of some pain on bilateral thighs.  Obeys commands  Objective: Vitals:   02/22/24 2003 02/22/24 2030 02/23/24 0128 02/23/24 0825  BP: 121/79 (!) 161/73 (!) 158/71 (!) 151/82  Pulse: 78 75 76 76  Resp: 18 18 17 17   Temp: 98.3 F (36.8 C)  98.1 F (36.7 C) 98.3 F (36.8 C)  TempSrc: Oral  Oral Oral  SpO2: 98% 98% 98% 96%  Weight:      Height:        Intake/Output Summary (Last 24 hours) at 02/23/2024 0845 Last data filed at 02/23/2024 0600 Gross per 24 hour  Intake 435.38 ml  Output 350 ml  Net 85.38 ml   Filed Weights   02/22/24 1238  Weight: 79.4 kg    Examination:  General exam: Overall comfortable, not in distress, pleasant elderly male HEENT: PERRL Respiratory system:  no wheezes or crackles  Cardiovascular system: S1 & S2 heard, RRR.  Gastrointestinal system: Abdomen is nondistended, soft and nontender. Central nervous system: Alert and mostly oriented Extremities: No edema, no clubbing ,no cyanosis Skin: No rashes, no ulcers,no icterus     Data Reviewed: I have personally reviewed following labs and imaging studies  CBC: Recent Labs  Lab 02/22/24 1322 02/23/24 0319  WBC 4.1 3.2*  NEUTROABS 2.1  --   HGB 10.7* 9.3*  HCT 32.4* 27.9*  MCV 89.3 88.9  PLT 289 254   Basic Metabolic Panel: Recent Labs  Lab 02/22/24 1322 02/23/24 0319  NA 134* 133*  K 3.4* 3.6  CL 99 99  CO2 23 21*  GLUCOSE 172* 106*  BUN 14 12  CREATININE 0.98 0.86  CALCIUM  9.2 9.0  MG 1.8 1.7     No results found for this or any previous visit (from the past 240 hours).   Radiology Studies: MR Lumbar Spine W Wo Contrast Result Date: 02/22/2024 EXAM: MRI LUMBAR SPINE 02/22/2024 06:47:20 PM TECHNIQUE: Multiplanar multisequence MRI of the lumbar spine was performed without or with the administration of intravenous contrast. COMPARISON: MRI lumbar spine 10/30/2023 CLINICAL HISTORY: Low back pain, cauda equina syndrome suspected FINDINGS:  SEGMENTATION: Transitional lumbosacral anatomy. The transitional lumbosacral vertebra is assumed to represent the S1 level. This conforms to the segmentation utilized on prior exams. BONES AND ALIGNMENT: Similar grade 1 retrolisthesis of L2 on L3 and improved grade 1 anterolisthesis of L4 on L5. Interval L4-L5 interbody fusion and PLIF. Hardware artifact limits assessment, but no obviouse bone marrow signal abnormality. SPINAL CORD: The conus terminates normally. SOFT TISSUES: New 3.5 x 2.4 cm fluid collection in the left psoas muscle at the L4-L5 level. More illdefined edema in the posterior paraspinal soft tissues with small fluid pockets is more typical of postoperative change. L1-L2 Disc height loss with degenerative endplate signal changes. Disc bulge with  mild to moderate canal stenosis. Patent foramina. L2-L3 Disc bulging and endplate spurring with ligamentum flavum thickening, resulting in progressive severe canal stenosis. Bilateral facet arthropathy with progressive moderate to severe left foraminal stenosis. Similar moderate right foraminal stenosis. L3-L4 Broad disc bulge with ligamentum flavum thickening and bilateral facet arthropathy. Similar versus slightly progressive moderate to severe canal stenosis. Similar moderate left foraminal stenosis. L4-L5 Interbody fusion and PLIF.  Persistent severe canal stenosis. L5-S1 Mild disc bulge with similar borderline mild foraminal stenosis. S1-S2 Transitional anatomy. No spinal canal stenosis or neural foraminal narrowing. IMPRESSION: 1. New 3.5 x 2.4 cm fluid collection in the left psoas muscle at the L4-L5 level with surrounding edema. The location is atypical for postoperative changes and is concerning for abscess. No obvious changes of discitis/osteomyelitis but hardware artifact limits assessment. Recommend correlation with infectious markers. 2. At L2-L3, progressive severe canal stenosis and moderate to severe left foraminal stenosis. Similar moderate  right foraminal stenosis. 3. At L3-L4, similar versus slightly progressive moderate to severe canal stenosis. 4. Interval fusion at L4-L5 with improved alignment but persistent severe canal stenosis. 5. Transitional lumbosacral vertebra is assumed to represent the S1 level. This conforms to the segmentation utilized on prior exams. Electronically signed by: Gilmore Molt MD 02/22/2024 09:12 PM EST RP Workstation: [id number]   CT PELVIS WO CONTRAST Result Date: 02/22/2024 CLINICAL DATA:  Fall in shower. History of frequent falls over the past 6-8 weeks. EXAM: CT PELVIS WITHOUT CONTRAST TECHNIQUE: Multidetector CT imaging of the pelvis was performed following the standard protocol without intravenous contrast. RADIATION DOSE REDUCTION: This exam was performed according to the departmental dose-optimization program which includes automated exposure control, adjustment of the mA and/or kV according to patient size and/or use of iterative reconstruction technique. COMPARISON:  Pelvic radiographs dated 02/22/2024. CT pelvis dated 10/30/2023. FINDINGS: Bones/Joint/Cartilage No acute fracture or dislocation. Femoral heads are seated within the acetabulum. 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 Bilateral greater trochanteric enthesopathy. The sacroiliac joints and pubic symphysis are anatomically aligned with degenerative changes. Partially visualized postoperative changes of the lower lumbar spine. Soft tissue and Muscles Soft tissue swelling along the left posterolateral abdominal wall. No loculated fluid collection. Intrapelvic contents Prostate implant seeds. Descending and sigmoid colonic diverticulosis. Moderate vascular calcifications again noted. No enlarged lymph nodes identified in the field of view. IMPRESSION: 1. No acute osseous abnormality. 2. Soft tissue swelling along the left posterolateral abdominal wall. No  fluid collection. 3. 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 Electronically Signed   By: Harrietta Sherry M.D.   On: 02/22/2024 16:13   CT Lumbar Spine Wo Contrast Result Date: 02/22/2024 EXAM: CT OF THE LUMBAR SPINE WITHOUT CONTRAST 02/22/2024 03:28:05 PM TECHNIQUE: CT of the lumbar spine was performed without the administration of intravenous contrast. Multiplanar reformatted images are provided for review. Automated exposure control, iterative reconstruction, and/or weight based adjustment of the mA/kV was utilized to reduce the radiation dose to as low as reasonably achievable. COMPARISON: MRI and CT lumbar spine 10/30/2023. CLINICAL HISTORY: Back trauma, no prior imaging (Age >= 16y). FINDINGS: BONES AND ALIGNMENT: Normal vertebral body heights. No evidence of compression fracture or displaced fracture in the lumbar spine. Redemonstrated transitional anatomy at the lumbosacral junction with a partially lumbarized S1 vertebral body and a near full size disc at the S1-S2 level. Interval PLIF at L4-L5 with bilateral pedicle screws and vertical interconnecting rods. Hardware is intact. Interbody spacer at L4-L5. There is no  osseous fusion across the L4-L5 disc space on the current study. Diffuse osteopenia. Degenerative changes of the sacroiliac joints. Alignment: Trace degenerative retrolisthesis of L1 on L2 and L2 on L3. Similar grade 1 anterolisthesis of L4 on L5. DEGENERATIVE CHANGES: Disc space narrowing and vacuum disc phenomenon at multiple levels. Degenerative endplate osteophytes at multiple levels. Posterior osteophytes along with facet arthrosis at L2-L3 contributing to moderate to severe spinal canal stenosis. Additional disc bulge and facet arthrosis at L3-L4 resulting in at least moderate spinal canal stenosis, although streak artifact slightly limits evaluation. Streak artifact limits evaluation of the spinal canal at L4-L5; there is likely residual moderate to severe  spinal canal stenosis at this level. SOFT TISSUES: Atherosclerosis of the abdominal aorta and branch vessels. Additional prominent atherosclerosis within the pelvis. Cholecystectomy clips. Diverticulosis of the partially visualized sigmoid colon. IMPRESSION: 1. No evidence of acute traumatic injury. 2. Interval PLIF at L4-L5. Hardware is intact. No osseous fusion across the L4-L5 disc space. 3. 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 4. At least moderate spinal canal stenosis at L3-L4 due to disc bulge and facet arthrosis. 5. Likely residual moderate to severe spinal canal stenosis at L4-L5, evaluation limited by streak artifact. Electronically signed by: Donnice Mania MD 02/22/2024 04:04 PM EST RP Workstation: [id number]   DG Pelvis 1-2 Views Result Date: 02/22/2024 EXAM: 1 or 2 VIEW(S) XRAY OF THE PELVIS 02/22/2024 01:09:00 PM COMPARISON: None available. CLINICAL HISTORY: [id number] Fall [id number] FINDINGS: BONES AND JOINTS: No acute fracture. No malalignment. Lumbar spinal fusion hardware noted. Moderate bilateral hip degenerative changes. SOFT TISSUES: Surgical clips overlie the pelvis. Vascular calcifications noted. IMPRESSION: 1. No evidence of acute traumatic injury. Electronically signed by: Morgane Naveau MD 02/22/2024 01:32 PM EST RP Workstation: [id number]   DG Chest 1 View Result Date: 02/22/2024 EXAM: 1 VIEW(S) XRAY OF THE CHEST 02/22/2024 01:09:00 PM COMPARISON: 10/19/2022 CLINICAL HISTORY: Fall FINDINGS: LUNGS AND PLEURA: No focal pulmonary opacity. No pleural effusion. No pneumothorax. HEART AND MEDIASTINUM: Calcified aorta. BONES AND SOFT TISSUES: Thoracic degenerative changes. No acute osseous abnormality. IMPRESSION: 1. No acute process. Electronically signed by: Morgane Naveau MD 02/22/2024 01:31 PM EST RP Workstation: [id number]   CT Cervical Spine Wo Contrast Result Date: 02/22/2024 EXAM: CT CERVICAL SPINE WITHOUT CONTRAST  02/22/2024 12:58:30 PM TECHNIQUE: CT of the cervical spine was performed without the administration of intravenous contrast. Multiplanar reformatted images are provided for review. Automated exposure control, iterative reconstruction, and/or weight based adjustment of the mA/kV was utilized to reduce the radiation dose to as low as reasonably achievable. COMPARISON: 10/30/2023 and 12/30/2020 CLINICAL HISTORY: Neck trauma (Age >= 65y) FINDINGS: BONES AND ALIGNMENT: No acute fracture or traumatic malalignment. There is straightening of the normal cervical lordosis. There is 3 mm anterolisthesis of C7 on T1 which is increased by approximately 1 mm since the 2022 study. Additional 4 mm anterolisthesis of T1 on T2 which is increased by approximately 1.5 x 2 mm since the 2022 study. DEGENERATIVE CHANGES: There is moderate to severe disc space narrowing at multiple levels in the cervical spine most pronounced at C5-C6 and C6-C7 overall similar to prior. There is severe disc space narrowing at T1-T2 which has increased from prior. Degenerative endplate osteophytes at multiple levels. Disc osteophyte complexes at multiple levels without high grade osseous spinal canal stenosis. There is facet arthrosis and uncovertebral hypertrophy at multiple levels. SOFT TISSUES: No prevertebral soft tissue swelling. IMPRESSION: 1. No acute cervical spine fracture. 2. 3  mm anterolisthesis of C7 on T1, increased from prior. 4 mm anterolisthesis of T1 on T2, increased since 2022. Findings likely related to increased degenerative changes. Electronically signed by: Donnice Mania MD 02/22/2024 01:21 PM EST RP Workstation: [id number]   CT Head Wo Contrast Result Date: 02/22/2024 EXAM: CT HEAD WITHOUT CONTRAST 02/22/2024 12:58:30 PM TECHNIQUE: CT of the head was performed without the administration of intravenous contrast. Automated exposure control, iterative reconstruction, and/or weight based adjustment of the mA/kV was utilized to reduce  the radiation dose to as low as reasonably achievable. COMPARISON: 10/30/2023 CLINICAL HISTORY: Head trauma, minor (Age >= 65y) FINDINGS: BRAIN AND VENTRICLES: No acute hemorrhage. No evidence of acute infarct. No hydrocephalus. No extra-axial collection. No mass effect or midline shift. Patchy and confluent decreased attenuation throughout the deep and periventricular white matter of the cerebral hemispheres bilaterally, compatible with chronic microvascular ischemic disease. Cerebral ventricle sizes concordant with degree of cerebral volume loss. Atherosclerotic calcifications within the cavernous internal carotid arteries. ORBITS: No acute abnormality. Left lens replacement. SINUSES: No acute abnormality. SOFT TISSUES AND SKULL: No acute soft tissue abnormality. No skull fracture. IMPRESSION: 1. No acute intracranial abnormality. Electronically signed by: Donnice Mania MD 02/22/2024 01:14 PM EST RP Workstation: [id number]    Scheduled Meds:  allopurinol   300 mg Oral Daily   atorvastatin   20 mg Oral Daily   carvedilol   3.125 mg Oral BID   cycloSPORINE   1 drop Both Eyes Daily   flecainide   100 mg Oral BID   folic acid   1 mg Oral Daily   gabapentin   300 mg Oral 2 times per day   hydrALAZINE   25 mg Oral QID   insulin  aspart  0-6 Units Subcutaneous Q4H   montelukast   10 mg Oral Daily   multivitamin with minerals  1 tablet Oral Daily   potassium chloride  SA  20 mEq Oral Daily   sodium chloride  flush  3 mL Intravenous Q12H   thiamine   100 mg Oral Daily   Or   thiamine   100 mg Intravenous Daily   Continuous Infusions:  sodium chloride  50 mL/hr at 02/23/24 9786   linezolid  (ZYVOX ) IV     piperacillin -tazobactam (ZOSYN )  IV       LOS: 1 day   Ivonne Mustache, MD [facility] P12/28/2025, 8:45 AM  "

## 2024-02-24 ENCOUNTER — Inpatient Hospital Stay (HOSPITAL_COMMUNITY)

## 2024-02-24 DIAGNOSIS — R4182 Altered mental status, unspecified: Secondary | ICD-10-CM

## 2024-02-24 DIAGNOSIS — F10931 Alcohol use, unspecified with withdrawal delirium: Secondary | ICD-10-CM

## 2024-02-24 DIAGNOSIS — G9341 Metabolic encephalopathy: Secondary | ICD-10-CM

## 2024-02-24 DIAGNOSIS — E119 Type 2 diabetes mellitus without complications: Secondary | ICD-10-CM | POA: Diagnosis not present

## 2024-02-24 DIAGNOSIS — F1090 Alcohol use, unspecified, uncomplicated: Secondary | ICD-10-CM | POA: Diagnosis not present

## 2024-02-24 DIAGNOSIS — R410 Disorientation, unspecified: Secondary | ICD-10-CM

## 2024-02-24 DIAGNOSIS — K6812 Psoas muscle abscess: Secondary | ICD-10-CM | POA: Diagnosis not present

## 2024-02-24 DIAGNOSIS — I10 Essential (primary) hypertension: Secondary | ICD-10-CM | POA: Diagnosis not present

## 2024-02-24 DIAGNOSIS — I4891 Unspecified atrial fibrillation: Secondary | ICD-10-CM

## 2024-02-24 DIAGNOSIS — I5032 Chronic diastolic (congestive) heart failure: Secondary | ICD-10-CM | POA: Diagnosis not present

## 2024-02-24 LAB — COMPREHENSIVE METABOLIC PANEL WITH GFR
ALT: 11 U/L (ref 0–44)
AST: 27 U/L (ref 15–41)
Albumin: 2.8 g/dL — ABNORMAL LOW (ref 3.5–5.0)
Alkaline Phosphatase: 104 U/L (ref 38–126)
Anion gap: 9 (ref 5–15)
BUN: 9 mg/dL (ref 8–23)
CO2: 22 mmol/L (ref 22–32)
Calcium: 8.5 mg/dL — ABNORMAL LOW (ref 8.9–10.3)
Chloride: 101 mmol/L (ref 98–111)
Creatinine, Ser: 0.76 mg/dL (ref 0.61–1.24)
GFR, Estimated: >60 mL/min
Glucose, Bld: 116 mg/dL — ABNORMAL HIGH (ref 70–99)
Potassium: 3.5 mmol/L (ref 3.5–5.1)
Sodium: 132 mmol/L — ABNORMAL LOW (ref 135–145)
Total Bilirubin: 1.1 mg/dL (ref 0.0–1.2)
Total Protein: 5 g/dL — ABNORMAL LOW (ref 6.5–8.1)

## 2024-02-24 LAB — CBC
HCT: 30.3 % — ABNORMAL LOW (ref 39.0–52.0)
Hemoglobin: 10.1 g/dL — ABNORMAL LOW (ref 13.0–17.0)
MCH: 29.8 pg (ref 26.0–34.0)
MCHC: 33.3 g/dL (ref 30.0–36.0)
MCV: 89.4 fL (ref 80.0–100.0)
Platelets: 267 K/uL (ref 150–400)
RBC: 3.39 MIL/uL — ABNORMAL LOW (ref 4.22–5.81)
RDW: 16.6 % — ABNORMAL HIGH (ref 11.5–15.5)
WBC: 5.3 K/uL (ref 4.0–10.5)
nRBC: 0 % (ref 0.0–0.2)

## 2024-02-24 LAB — BLOOD GAS, ARTERIAL
Acid-base deficit: 2 mmol/L (ref 0.0–2.0)
Bicarbonate: 21.7 mmol/L (ref 20.0–28.0)
Drawn by: 331471
O2 Content: 4 L/min
O2 Saturation: 99.2 %
Patient temperature: 37
pCO2 arterial: 32 mmHg (ref 32–48)
pH, Arterial: 7.44 (ref 7.35–7.45)
pO2, Arterial: 94 mmHg (ref 83–108)

## 2024-02-24 LAB — GLUCOSE, CAPILLARY
Glucose-Capillary: 103 mg/dL — ABNORMAL HIGH (ref 70–99)
Glucose-Capillary: 98 mg/dL (ref 70–99)
Glucose-Capillary: 90 mg/dL (ref 70–99)
Glucose-Capillary: 126 mg/dL — ABNORMAL HIGH (ref 70–99)
Glucose-Capillary: 119 mg/dL — ABNORMAL HIGH (ref 70–99)
Glucose-Capillary: 157 mg/dL — ABNORMAL HIGH (ref 70–99)

## 2024-02-24 LAB — AMMONIA: Ammonia: 18 umol/L (ref 9–35)

## 2024-02-24 MED ORDER — PHENOBARBITAL SODIUM 130 MG/ML IJ SOLN: 130.0000 mg | Freq: Once | INTRAMUSCULAR | Status: DC

## 2024-02-24 MED ORDER — ORAL CARE MOUTH RINSE
15.0000 mL | OROMUCOSAL | Status: DC
  Administered 2024-02-25 – 2024-02-27 (×12): 15 mL via OROMUCOSAL

## 2024-02-24 MED ORDER — MIDAZOLAM HCL 2 MG/2ML IJ SOLN
INTRAMUSCULAR | Status: AC
  Filled 2024-02-24: qty 2

## 2024-02-24 MED ORDER — ORAL CARE MOUTH RINSE: 15.0000 mL | OROMUCOSAL | Status: DC | PRN

## 2024-02-24 MED ORDER — MIDAZOLAM HCL (PF) 2 MG/2ML IJ SOLN
INTRAMUSCULAR | Status: AC | PRN
  Administered 2024-02-24: 1 mg via INTRAVENOUS

## 2024-02-24 MED ORDER — PHENOBARBITAL SODIUM 65 MG/ML IJ SOLN: 32.5000 mg | Freq: Three times a day (TID) | INTRAMUSCULAR | Status: DC

## 2024-02-24 MED ORDER — METOPROLOL TARTRATE 5 MG/5ML IV SOLN
2.5000 mg | Freq: Three times a day (TID) | INTRAVENOUS | Status: DC
  Administered 2024-02-24 – 2024-02-25 (×3): 2.5 mg via INTRAVENOUS
  Filled 2024-02-24 (×4): qty 5

## 2024-02-24 MED ORDER — PHENOBARBITAL SODIUM 130 MG/ML IJ SOLN: 97.5000 mg | Freq: Three times a day (TID) | INTRAMUSCULAR | Status: DC

## 2024-02-24 MED ORDER — PHENOBARBITAL SODIUM 65 MG/ML IJ SOLN
65.0000 mg | Freq: Three times a day (TID) | INTRAMUSCULAR | Status: AC
  Administered 2024-02-24 – 2024-02-26 (×6): 65 mg via INTRAVENOUS
  Filled 2024-02-24 (×6): qty 1

## 2024-02-24 MED ORDER — HYDRALAZINE HCL 20 MG/ML IJ SOLN
10.0000 mg | Freq: Four times a day (QID) | INTRAMUSCULAR | Status: DC | PRN
  Administered 2024-02-25: 10 mg via INTRAVENOUS
  Filled 2024-02-24: qty 1

## 2024-02-24 MED ORDER — PHENOBARBITAL SODIUM 65 MG/ML IJ SOLN: 65.0000 mg | Freq: Three times a day (TID) | INTRAMUSCULAR | Status: DC

## 2024-02-24 MED ORDER — FENTANYL CITRATE (PF) 100 MCG/2ML IJ SOLN
INTRAMUSCULAR | Status: AC
  Filled 2024-02-24: qty 2

## 2024-02-24 MED ORDER — DEXMEDETOMIDINE HCL IN NACL 200 MCG/50ML IV SOLN
0.0000 ug/kg/h | INTRAVENOUS | Status: DC
  Administered 2024-02-24: 0.4 ug/kg/h via INTRAVENOUS
  Administered 2024-02-24: 0.7 ug/kg/h via INTRAVENOUS
  Administered 2024-02-24: 0.5 ug/kg/h via INTRAVENOUS
  Administered 2024-02-25 (×2): 0.2 ug/kg/h via INTRAVENOUS
  Filled 2024-02-24 (×4): qty 50

## 2024-02-24 MED ORDER — DEXMEDETOMIDINE HCL IN NACL 200 MCG/50ML IV SOLN
INTRAVENOUS | Status: AC
  Filled 2024-02-24: qty 50

## 2024-02-24 MED ORDER — POTASSIUM CHLORIDE 10 MEQ/100ML IV SOLN
10.0000 meq | INTRAVENOUS | Status: AC
  Administered 2024-02-24 (×3): 10 meq via INTRAVENOUS
  Filled 2024-02-24 (×3): qty 100

## 2024-02-24 MED ORDER — SODIUM CHLORIDE 0.9 % IV SOLN: INTRAVENOUS | Status: DC

## 2024-02-24 MED ORDER — FENTANYL CITRATE (PF) 100 MCG/2ML IJ SOLN
INTRAMUSCULAR | Status: AC | PRN
  Administered 2024-02-24: 50 ug via INTRAVENOUS

## 2024-02-24 MED ORDER — FOLIC ACID 5 MG/ML IJ SOLN
1.0000 mg | Freq: Every day | INTRAMUSCULAR | Status: DC
  Administered 2024-02-24 – 2024-02-25 (×2): 1 mg via INTRAVENOUS
  Filled 2024-02-24 (×2): qty 0.2

## 2024-02-24 NOTE — Progress Notes (Signed)
 " PROGRESS NOTE  Zachary GORMAN Juanito Mickey.  FMW:[id number] DOB: 1940/07/01 DOA: 02/22/2024 PCP: Theophilus Andrews, Tully GRADE, MD   Brief Narrative: Charles Lloyd. is an 83 y.o. male with medical history significant for hypertension, hyperlipidemia, type 2 diabetes mellitus, atrial fibrillation on Eliquis , and L4-5 spondylolisthesis status post L4-5 trans psoas lumbar interbody fusion left-sided approach on 02/06/2024, now presenting with ongoing leg weakness, pain, and recurrent falls.Patient reported that he has been falling which he attributes to severe pain in the hips and the legs, left greater than right, as well as tremor and leg weakness.  He fell in the shower tonight and hit his head.  On presentation, he was hemodynamically stable.Imaging in the ED is most notable for new 3.5 x 2.4 cm fluid collection in the left psoas muscle noted on MRI and concerning for abscess. Neurosurgery (Dr. Colon) was consulted by the ED physician and recommended IV antibiotics and IR consultation for possible aspiration.  IR consulted, on board spectrum antibiotics. Hospital course remarkable for new onset of confusion.  Likely from delirium of unclear etiology: Could be from alcohol withdrawal or due to underlying infection causing sepsis.  Confusion gradually worsened, became agitated.  Pending MRI of the brain.  PCCM consulted.  Plan is to start on Precedex  drip. TRH will sign off  Assessment & Plan:  Principal Problem:   Psoas abscess, left (HCC) Active Problems:   Paroxysmal A-fib (HCC)   Chronic heart failure with preserved ejection fraction (HFpEF) (HCC)   NIDDM-2 with polyneuropathy and hyperglycemia   Hypercholesterolemia   Asthma   Essential hypertension   Left psoas abscess  - continue broad-spectrum antibiotics, consulted IR for aspiration  .  Follow-up cultures.  Follow-up blood cultures.  Patient is afebrile, no leukocytosis  Confusion/acute metabolic encephalopathy New problem.  History of  dementia.  Report of taking 2-3 colic drinks a day.  Started on CIWA.  Likely from delirium of unclear etiology: Could be from alcohol withdrawal or due to underlying infection causing sepsis.   Continue delirium precaution, frequent reorientation.  CT head negative for acute findings.  MRI pending.   Weakness and falls  - Patient reports multiple falls in last few weeks that he attributes to leg weakness, pain, and tremors    -  consulted PT.  TSH, vitamin B12, folate normal   Atrial fibrillation  - Hold Eliquis  pending IR and neurosurgery consultation, continue flecainide  and Coreg  as tolerated  .  On normal sinus rhythm at present.  Has prolonged QTc.  Monitor on telemetry.  Eliquis  needs to be resumed after IR procedure.   Hypertension  - on  Coreg  and hydralazine      Type II DM  - A1c was 5.9% earlier this month  - Check CBGs and use low-intensity SSI if needed     Chronic HFpEF  - Appears compensated     Asthma  - Not in exacerbation  - Continue Singulair  and as-needed albuterol              DVT prophylaxis:SCDs Start: 02/23/24 0010     Code Status: Full Code  Family Communication: Son at bedside on 12/29  Patient status: Inpatient  Patient is from : Home  Anticipated discharge to: not sure  Estimated DC date:after  Full workup   Consultants: Neurosurgery,PCCM  Procedures: None yet  Antimicrobials:  Anti-infectives (From admission, onward)    Start     Dose/Rate Route Frequency Ordered Stop   02/23/24 2200  vancomycin  (VANCOREADY) IVPB 1500  mg/300 mL  Status:  Discontinued        1,500 mg 150 mL/hr over 120 Minutes Intravenous Every 24 hours 02/22/24 2348 02/23/24 0242   02/23/24 1000  linezolid  (ZYVOX ) IVPB 600 mg       Note to Pharmacy: Pt developed rash shortly after vancomycin  administration.   600 mg 300 mL/hr over 60 Minutes Intravenous Every 12 hours 02/23/24 0242     02/23/24 0800  piperacillin -tazobactam (ZOSYN ) IVPB 3.375 g        3.375  g 12.5 mL/hr over 240 Minutes Intravenous Every 8 hours 02/22/24 2348     02/22/24 2315  piperacillin -tazobactam (ZOSYN ) IVPB 3.375 g        3.375 g 100 mL/hr over 30 Minutes Intravenous  Once 02/22/24 2306 02/23/24 0013   02/22/24 2315  vancomycin  (VANCOREADY) IVPB 1500 mg/300 mL        1,500 mg 150 mL/hr over 120 Minutes Intravenous  Once 02/22/24 2306 02/23/24 0141       Subjective: Patient seen and examined at bedside today.  Became more confused this morning.  Has not really responded to Ativan .  Became agitated.  Gradually became tachypneic, tachycardic.  Blood pressure stable.  No fever.  Patient seen multiple times throughout the day today.  Discussed with PCCM and transferred to ICU.  Management plan discussed at length with son multiple times  Objective: Vitals:   02/24/24 1150 02/24/24 1209 02/24/24 1219 02/24/24 1245  BP: (!) 160/81 (!) 145/92 (!) 155/116 (!) 156/81  Pulse: (!) 105 96 93   Resp: (!) 37     Temp:   99.9 F (37.7 C) (!) 100.6 F (38.1 C)  TempSrc:   Oral Rectal  SpO2: 100%     Weight:    79.3 kg  Height:    5' 8 (1.727 m)    Intake/Output Summary (Last 24 hours) at 02/24/2024 1314 Last data filed at 02/24/2024 1000 Gross per 24 hour  Intake 1509.87 ml  Output 250 ml  Net 1259.87 ml   Filed Weights   02/22/24 1238 02/24/24 1245  Weight: 79.4 kg 79.3 kg    Examination:   General exam: Lying on bed, disoriented, agitated HEENT: PERRL Respiratory system:  no wheezes or crackles  Cardiovascular system: S1 & S2 heard, RRR.  Gastrointestinal system: Abdomen is nondistended, soft and nontender. Central nervous system: Awake but not alert or oriented Extremities: No edema, no clubbing ,no cyanosis, area of presumed abscess on the left lower back Skin: No rashes, no ulcers,no icterus       Data Reviewed: I have personally reviewed following labs and imaging studies  CBC: Recent Labs  Lab 02/22/24 1322 02/23/24 0319 02/24/24 0732  WBC  4.1 3.2* 5.3  NEUTROABS 2.1  --   --   HGB 10.7* 9.3* 10.1*  HCT 32.4* 27.9* 30.3*  MCV 89.3 88.9 89.4  PLT 289 254 267   Basic Metabolic Panel: Recent Labs  Lab 02/22/24 1322 02/23/24 0319 02/24/24 0238  NA 134* 133* 132*  K 3.4* 3.6 3.5  CL 99 99 101  CO2 23 21* 22  GLUCOSE 172* 106* 116*  BUN 14 12 9   CREATININE 0.98 0.86 0.76  CALCIUM  9.2 9.0 8.5*  MG 1.8 1.7  --      No results found for this or any previous visit (from the past 240 hours).   Radiology Studies: MR Lumbar Spine W Wo Contrast Result Date: 02/22/2024 EXAM: MRI LUMBAR SPINE 02/22/2024 06:47:20 PM TECHNIQUE: Multiplanar multisequence  MRI of the lumbar spine was performed without or with the administration of intravenous contrast. COMPARISON: MRI lumbar spine 10/30/2023 CLINICAL HISTORY: Low back pain, cauda equina syndrome suspected FINDINGS: SEGMENTATION: Transitional lumbosacral anatomy. The transitional lumbosacral vertebra is assumed to represent the S1 level. This conforms to the segmentation utilized on prior exams. BONES AND ALIGNMENT: Similar grade 1 retrolisthesis of L2 on L3 and improved grade 1 anterolisthesis of L4 on L5. Interval L4-L5 interbody fusion and PLIF. Hardware artifact limits assessment, but no obviouse bone marrow signal abnormality. SPINAL CORD: The conus terminates normally. SOFT TISSUES: New 3.5 x 2.4 cm fluid collection in the left psoas muscle at the L4-L5 level. More illdefined edema in the posterior paraspinal soft tissues with small fluid pockets is more typical of postoperative change. L1-L2 Disc height loss with degenerative endplate signal changes. Disc bulge with mild to moderate canal stenosis. Patent foramina. L2-L3 Disc bulging and endplate spurring with ligamentum flavum thickening, resulting in progressive severe canal stenosis. Bilateral facet arthropathy with progressive moderate to severe left foraminal stenosis. Similar moderate right foraminal stenosis. L3-L4 Broad disc  bulge with ligamentum flavum thickening and bilateral facet arthropathy. Similar versus slightly progressive moderate to severe canal stenosis. Similar moderate left foraminal stenosis. L4-L5 Interbody fusion and PLIF.  Persistent severe canal stenosis. L5-S1 Mild disc bulge with similar borderline mild foraminal stenosis. S1-S2 Transitional anatomy. No spinal canal stenosis or neural foraminal narrowing. IMPRESSION: 1. New 3.5 x 2.4 cm fluid collection in the left psoas muscle at the L4-L5 level with surrounding edema. The location is atypical for postoperative changes and is concerning for abscess. No obvious changes of discitis/osteomyelitis but hardware artifact limits assessment. Recommend correlation with infectious markers. 2. At L2-L3, progressive severe canal stenosis and moderate to severe left foraminal stenosis. Similar moderate right foraminal stenosis. 3. At L3-L4, similar versus slightly progressive moderate to severe canal stenosis. 4. Interval fusion at L4-L5 with improved alignment but persistent severe canal stenosis. 5. Transitional lumbosacral vertebra is assumed to represent the S1 level. This conforms to the segmentation utilized on prior exams. Electronically signed by: Gilmore Molt MD 02/22/2024 09:12 PM EST RP Workstation: [id number]   CT PELVIS WO CONTRAST Result Date: 02/22/2024 CLINICAL DATA:  Fall in shower. History of frequent falls over the past 6-8 weeks. EXAM: CT PELVIS WITHOUT CONTRAST TECHNIQUE: Multidetector CT imaging of the pelvis was performed following the standard protocol without intravenous contrast. RADIATION DOSE REDUCTION: This exam was performed according to the departmental dose-optimization program which includes automated exposure control, adjustment of the mA and/or kV according to patient size and/or use of iterative reconstruction technique. COMPARISON:  Pelvic radiographs dated 02/22/2024. CT pelvis dated 10/30/2023. FINDINGS: Bones/Joint/Cartilage No  acute fracture or dislocation. Femoral heads are seated within the acetabulum. 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 Bilateral greater trochanteric enthesopathy. The sacroiliac joints and pubic symphysis are anatomically aligned with degenerative changes. Partially visualized postoperative changes of the lower lumbar spine. Soft tissue and Muscles Soft tissue swelling along the left posterolateral abdominal wall. No loculated fluid collection. Intrapelvic contents Prostate implant seeds. Descending and sigmoid colonic diverticulosis. Moderate vascular calcifications again noted. No enlarged lymph nodes identified in the field of view. IMPRESSION: 1. No acute osseous abnormality. 2. Soft tissue swelling along the left posterolateral abdominal wall. No fluid collection. 3. 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 Electronically Signed   By: Harrietta Sherry M.D.   On: 02/22/2024 16:13   CT Lumbar  Spine Wo Contrast Result Date: 02/22/2024 EXAM: CT OF THE LUMBAR SPINE WITHOUT CONTRAST 02/22/2024 03:28:05 PM TECHNIQUE: CT of the lumbar spine was performed without the administration of intravenous contrast. Multiplanar reformatted images are provided for review. Automated exposure control, iterative reconstruction, and/or weight based adjustment of the mA/kV was utilized to reduce the radiation dose to as low as reasonably achievable. COMPARISON: MRI and CT lumbar spine 10/30/2023. CLINICAL HISTORY: Back trauma, no prior imaging (Age >= 16y). FINDINGS: BONES AND ALIGNMENT: Normal vertebral body heights. No evidence of compression fracture or displaced fracture in the lumbar spine. Redemonstrated transitional anatomy at the lumbosacral junction with a partially lumbarized S1 vertebral body and a near full size disc at the S1-S2 level. Interval PLIF at L4-L5 with bilateral pedicle screws and vertical  interconnecting rods. Hardware is intact. Interbody spacer at L4-L5. There is no osseous fusion across the L4-L5 disc space on the current study. Diffuse osteopenia. Degenerative changes of the sacroiliac joints. Alignment: Trace degenerative retrolisthesis of L1 on L2 and L2 on L3. Similar grade 1 anterolisthesis of L4 on L5. DEGENERATIVE CHANGES: Disc space narrowing and vacuum disc phenomenon at multiple levels. Degenerative endplate osteophytes at multiple levels. Posterior osteophytes along with facet arthrosis at L2-L3 contributing to moderate to severe spinal canal stenosis. Additional disc bulge and facet arthrosis at L3-L4 resulting in at least moderate spinal canal stenosis, although streak artifact slightly limits evaluation. Streak artifact limits evaluation of the spinal canal at L4-L5; there is likely residual moderate to severe spinal canal stenosis at this level. SOFT TISSUES: Atherosclerosis of the abdominal aorta and branch vessels. Additional prominent atherosclerosis within the pelvis. Cholecystectomy clips. Diverticulosis of the partially visualized sigmoid colon. IMPRESSION: 1. No evidence of acute traumatic injury. 2. Interval PLIF at L4-L5. Hardware is intact. No osseous fusion across the L4-L5 disc space. 3. 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 4. At least moderate spinal canal stenosis at L3-L4 due to disc bulge and facet arthrosis. 5. Likely residual moderate to severe spinal canal stenosis at L4-L5, evaluation limited by streak artifact. Electronically signed by: Donnice Mania MD 02/22/2024 04:04 PM EST RP Workstation: [id number]    Scheduled Meds:  allopurinol   300 mg Oral Daily   atorvastatin   20 mg Oral Daily   carvedilol   3.125 mg Oral BID   cycloSPORINE   1 drop Both Eyes Daily   flecainide   100 mg Oral BID   folic acid   1 mg Oral Daily   gabapentin   100 mg Oral 2 times per day   hydrALAZINE   25 mg Oral QID    hydrocortisone  cream   Topical TID   insulin  aspart  0-6 Units Subcutaneous Q4H   montelukast   10 mg Oral Daily   multivitamin with minerals  1 tablet Oral Daily   PHENObarbital   65 mg Intravenous Q8H   Followed by   [START ON 02/26/2024] PHENObarbital   32.5 mg Intravenous Q8H   potassium chloride  SA  20 mEq Oral Daily   sodium chloride  flush  3 mL Intravenous Q12H   thiamine   100 mg Oral Daily   Or   thiamine   100 mg Intravenous Daily   Continuous Infusions:  sodium chloride  100 mL/hr at 02/24/24 1244   dexmedetomidine  (PRECEDEX ) IV infusion 0.4 mcg/kg/hr (02/24/24 1210)   linezolid  (ZYVOX ) IV 600 mg (02/24/24 1305)   piperacillin -tazobactam (ZOSYN )  IV 3.375 g (02/24/24 1253)     LOS: 2 days   Ivonne Mustache, MD [facility] P12/29/2025, 1:14 PM  "

## 2024-02-24 NOTE — Progress Notes (Signed)
 MEWS Progress Note  Patient Details Name: Tramaine Sauls. MRN: [id number] DOB: 11/04/1940 Today's Date: 02/24/2024   MEWS Flowsheet Documentation:  Assess: MEWS Score Temp: 97.7 F (36.5 C) BP: (!) 160/81 MAP (mmHg): 102 Pulse Rate: (!) 105 Resp: (!) 37 Level of Consciousness: New agitation confusion SpO2: 100 % O2 Device: Nasal Cannula O2 Flow Rate (L/min): 3 L/min Assess: MEWS Score MEWS Temp: 0 MEWS Systolic: 0 MEWS Pulse: 1 MEWS RR: 3 MEWS LOC: 1 MEWS Score: 5 MEWS Score Color: Red Assess: SIRS CRITERIA SIRS Temperature : 0 SIRS Respirations : 1 SIRS Pulse: 1 SIRS WBC: 0 SIRS Score Sum : 2 SIRS Temperature : 0 SIRS Pulse: 1 SIRS Respirations : 1 SIRS WBC: 0 SIRS Score Sum : 2 Assess: if the MEWS score is Yellow or Red Were vital signs accurate and taken at a resting state?: Yes Does the patient meet 2 or more of the SIRS criteria?: Yes Does the patient have a confirmed or suspected source of infection?: No MEWS guidelines implemented : Yes, red Treat MEWS Interventions: Considered administering scheduled or prn medications/treatments as ordered Take Vital Signs Increase Vital Sign Frequency : Red: Q1hr x2, continue Q4hrs until patient remains green for 12hrs Escalate MEWS: Escalate: Red: Discuss with charge nurse and notify provider. Consider notifying RRT. If remains red for 2 hours consider need for higher level of care Notify: Charge Nurse/RN Name of Charge Nurse/RN Notified: Efrain Smalling, RN Provider Notification Provider Name/Title: Jillian Buttery, MD Date Provider Notified: 02/24/24 Time Provider Notified: 1200 Method of Notification: Page Notification Reason: Change in status Provider response: No new orders Date of Provider Response: 02/24/24 Time of Provider Response: 1201 Notify: Rapid Response Name of Rapid Response RN Notified: Heron Beal, RN Date Rapid Response Notified: 02/24/24 Time Rapid Response Notified: 1200 (Already at  bedside)      Aleck DELENA Dellen 02/24/2024, 12:01 PM

## 2024-02-24 NOTE — Progress Notes (Signed)
 PT Cancellation Note  Patient Details Name: Charles Lloyd. MRN: [id number] DOB: 1940/12/28   Cancelled Treatment:    Reason Eval/Treat Not Completed: Medical issues which prohibited therapy;Other (comment)  PT attempted, pt with continued confusion, going to MRI at this time. Continue efforts to complete PT eval.  Rexene, PT  Acute Rehab Dept [facility]) [phone]  02/24/2024  [facility] 02/24/2024, 11:40 AM

## 2024-02-24 NOTE — Progress Notes (Signed)
 Call placed to patients son, Kaevion to give update regarding new ned assignment [id number].

## 2024-02-24 NOTE — Plan of Care (Signed)
  Problem: Safety: Goal: Ability to remain free from injury will improve Outcome: Progressing   Problem: Pain Managment: Goal: General experience of comfort will improve and/or be controlled Outcome: Progressing   Problem: Elimination: Goal: Will not experience complications related to urinary retention Outcome: Progressing

## 2024-02-24 NOTE — TOC Progression Note (Signed)
 Transition of Care (TOC) - Progression Note    Patient Details  Name: Charles Lloyd. MRN: [id number] Date of Birth: 28-Feb-1940  Transition of Care [facility]) CM/SW Contact  Alfonse JONELLE Rex, RN Phone Number: 02/24/2024, 12:46 PM  Clinical Narrative:   NCM attempted to speak with pt/family at bedside, unable due to patient receiving medical care, patient transferred to [facility].                       Expected Discharge Plan and Services                                               Social Drivers of Health (SDOH) Interventions SDOH Screenings   Food Insecurity: No Food Insecurity (02/23/2024)  Housing: Low Risk (02/23/2024)  Transportation Needs: No Transportation Needs (02/23/2024)  Utilities: Not At Risk (02/23/2024)  Alcohol Screen: Low Risk (12/21/2022)  Depression (PHQ2-9): Low Risk (02/11/2024)  Financial Resource Strain: Low Risk (12/21/2022)  Physical Activity: Inactive (12/21/2022)  Social Connections: Socially Isolated (02/23/2024)  Stress: No Stress Concern Present (12/21/2022)  Tobacco Use: Low Risk (02/22/2024)  Health Literacy: Adequate Health Literacy (12/21/2022)    Readmission Risk Interventions    08/08/2021   10:00 AM  Readmission Risk Prevention Plan  Transportation Screening Complete  Medication Review (RN Care Manager) Complete  PCP or Specialist appointment within 3-5 days of discharge Complete  HRI or Home Care Consult Complete  SW Recovery Care/Counseling Consult Not Complete  Palliative Care Screening Not Applicable  Skilled Nursing Facility Not Applicable

## 2024-02-24 NOTE — Consult Note (Addendum)
 "   NAME:  Charles Bruntz., MRN:  [id number], DOB:  December 26, 1940, LOS: 2 ADMISSION DATE:  02/22/2024, CONSULTATION DATE:  02/24/24 REFERRING MD:  hospitalist, CHIEF COMPLAINT:  altered mental status   History of Present Illness:  Charles Lloyd. is a 83 y.o. male with significant PMH of hypertension, hyperlipidemia, type 2 diabetes, atrial fibrillation on anticoagulation, s/p recent L4-5 trans psoas lumbar interbody fusion left-sided approach on 02/06/2024, who presented to the emergency department on 02/22/2024 for leg pain and weakness, with family reporting multiple falls over the past 6 to 8 months.  MRI lumbar spine revealed a new 3.5 x 2.4 cm fluid collection in the left psoas muscle concerning for abscess.  Neurosurgery was consulted at that time and recommended IV antibiotics and possible aspiration of abscess.   Patient has developed worsening confusion since yesterday, and CCM was consulted for further evaluation and management.  Patient lying in bed, not following commands, in bilateral upper extremity mittens, but moving all extremities.  Patient is not able to contribute to history or review of systems due to current mental status.  Patient's daughter and son-in-law is present in the room that provides history.  They state that the patient has not slept in approximately 72 hours since admission, they noticed more confusion yesterday that has since worsened.  Daughter states that patient drinks approximately 2 shots of alcohol daily in addition to going to local bars and drinking an unknown amount of alcohol.  Family states that they are not aware of any history of alcohol withdrawal or delirium in the past during previous hospitalizations, and state the patient is usually very mentally sharp.  Pertinent  Medical History   has a past medical history of Acute tracheobronchitis (01/05/2019), B12 deficiency, Basal cell carcinoma (BCC) of left side of nose (01/28/2018), Cancer (HCC),  Cardiomyopathy (HCC) (03/10/2018), CHF (congestive heart failure) (HCC) (04/16/2013), Chronic anticoagulation, Chronic diastolic (congestive) heart failure (HCC) (04/16/2013), Chronic diastolic CHF (congestive heart failure) (HCC) (04/16/2013), Colon polyps (04/16/2013), Coronary artery disease involving native coronary artery of native heart without angina pectoris (04/05/2022), Diabetic neuropathy (HCC), Difficult intubation, Dysrhythmia, Eosinophil count raised (02/17/2019), Essential hypertension (01/06/2010), Gout, 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), Hypercholesterolemia (01/06/2010), Hyperlipidemia associated with type 2 diabetes mellitus (HCC) (01/28/2018), Hypertensive heart disease with heart failure (HCC) (01/06/2010), Hyponatremia (12/17/2018), 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), Infection of prosthetic right knee joint, Insomnia (03/04/2012), Lesion of liver (04/20/2019), Mild reactive airways disease, Neoplasm of prostate (04/16/2013), Neoplasm of prostate, 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   Significant Hospital Events: Including procedures, antibiotic start and stop dates in addition to other pertinent events   12/29 consulted for altered mental status, moved to ICU and started on precedex  and phenobarbital  taper  Interim History / Subjective:    Objective    Blood pressure (!) 155/116, pulse 93, temperature 99.9 F (37.7 C), temperature source Oral, resp. rate (!) 37, height 5' 8 (1.727 m), weight 79.4 kg,  SpO2 100%.        Intake/Output Summary (Last 24 hours) at 02/24/2024 1227 Last data filed at 02/24/2024 1000 Gross per 24 hour  Intake 1509.87 ml  Output 250 ml  Net 1259.87 ml   [employer]  02/22/24 1238  Weight: 79.4 kg    Examination: General: elderly male, lying in bed, moaning but does not answer questions, moving extremities with moderate agitation  HENT: [place]/AT, pupils PEARL Lungs: clear bilaterally  Cardiovascular: RRR  Abdomen: non distended, non tender, normoactive bowel sounds  Extremities: no deformity  Neuro: eyes closed, pupils PEARL, non verbal due to altered mental status, moaning and moving in bed   Resolved problem list   Assessment and Plan   Acute Metabolic Encephalopathy, multifactorial  DDX: hospital delirium, alcohol withdrawal, infectious source -Patient reportedly became more confused yesterday and overnight. History of alcohol use (2 shots daily plus unknown amount of alcohol use at local bars often). No history of alcohol withdrawal per family. 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 No history of alcohol withdrawal per family.  -IV thiamine , IV folic acid  while NPO -Phenobarbital  taper -Precedex    Left psoas abscess 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 -Empiric Zyvox  and Zosyn  -Fluid culture ordered for IR drainage planned for today  History of recent falls -consider PT consult  Atrial Fibrillation Hypertension Chronic Diastolic Congestive Heart Failure -Eliquis  on hold since admission for IR drainage of psoas abscess -Switch PO hydralazine  to  IV hydralazine  10mg  q6h PRN -Add low dose IV metoprolol  in place of PO carvedilol  3.125mg  BID -Flecainide  100mg  BID on hold while NPO, monitor and consider cardiology input  Type II Diabetes, non insulin  requiring Diabetic Neuropathy  -glucose monitoring with sliding scale insulin   -Hold gabapentin     Labs   CBC: Recent Labs  Lab 02/22/24 1322 02/23/24 0319 02/24/24 0732  WBC 4.1 3.2* 5.3  NEUTROABS 2.1  --   --   HGB 10.7* 9.3* 10.1*  HCT 32.4* 27.9* 30.3*  MCV 89.3 88.9 89.4  PLT 289 254 267    Basic Metabolic Panel: Recent Labs  Lab 02/22/24 1322 02/23/24 0319 02/24/24 0238  NA 134* 133* 132*  K 3.4* 3.6 3.5  CL 99 99 101  CO2 23 21* 22  GLUCOSE 172* 106* 116*  BUN 14 12 9   CREATININE 0.98 0.86 0.76  CALCIUM  9.2 9.0 8.5*  MG 1.8 1.7  --    GFR: Estimated Creatinine Clearance: 70.1 mL/min (by C-G formula based on SCr of 0.76 mg/dL). Recent Labs  Lab 02/22/24 1322 02/23/24 0319 02/24/24 0732  WBC 4.1 3.2* 5.3    Liver Function Tests: Recent Labs  Lab 02/22/24 1322 02/24/24 0238  AST 33 27  ALT 17 11  ALKPHOS 128* 104  BILITOT 1.0 1.1  PROT 5.7* 5.0*  ALBUMIN  3.4* 2.8*   No results for input(s): LIPASE, AMYLASE in the last 168 hours. Recent Labs  Lab 02/24/24 0238  AMMONIA 18    ABG    Component Value Date/Time   HCO3 27.5 01/05/2019 1243   O2SAT 57.4 01/05/2019 1243     Coagulation Profile: Recent Labs  Lab 02/23/24 1153  INR 1.5*    Cardiac Enzymes: Recent Labs  Lab 02/23/24 0319  CKTOTAL 53    HbA1C: Hemoglobin A1C  Date/Time Value Ref Range Status  07/31/2023 01:38 PM 6.0 (A) 4.0 - 5.6 % Final   Hgb A1c MFr Bld  Date/Time Value Ref Range Status  02/05/2024 11:40 AM 5.9 (H) 4.8 - 5.6 % Final    Comment:    (NOTE) Diagnosis of Diabetes The following HbA1c ranges recommended by the American Diabetes Association (ADA) may be used as an aid in the diagnosis of diabetes mellitus.  Hemoglobin              Suggested A1C NGSP%              Diagnosis  <5.7                   Non Diabetic  5.7-6.4                Pre-Diabetic  >6.4                   Diabetic  <7.0                   Glycemic control for                       adults with diabetes.    07/12/2022 11:22 AM 6.7 (H) 4.6 - 6.5 % Final    Comment:    Glycemic Control Guidelines for People with Diabetes:Non Diabetic:  <6%Goal of Therapy: <7%Additional Action Suggested:  >8%     CBG: Recent Labs  Lab 02/23/24 1632 02/23/24 1955 02/23/24 2339 02/24/24 0531 02/24/24 0748  GLUCAP 92 118* 101* 103* 98    Review of Systems:   Due to altered mental status, patient unable to contribute to review of systems.  Past Medical History:  He,  has a past medical history of Acute tracheobronchitis (01/05/2019), B12 deficiency, Basal cell carcinoma (BCC) of left side of nose (01/28/2018), Cancer (HCC), Cardiomyopathy (HCC) (03/10/2018), CHF (congestive heart failure) (HCC) (04/16/2013), Chronic anticoagulation, Chronic diastolic (congestive) heart failure (HCC) (04/16/2013), Chronic diastolic CHF (congestive heart failure) (HCC) (04/16/2013), Colon polyps (04/16/2013), Coronary artery disease involving native coronary artery of native heart without angina pectoris (04/05/2022), Diabetic neuropathy (HCC), Difficult intubation, Dysrhythmia, Eosinophil count raised (02/17/2019), Essential hypertension (01/06/2010), Gout, 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), Hypercholesterolemia (01/06/2010), Hyperlipidemia associated with type 2 diabetes mellitus (HCC) (01/28/2018), Hypertensive heart disease with heart failure (HCC) (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/2010), Hyponatremia (12/17/2018), 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), Infection of prosthetic right knee joint, Insomnia (03/04/2012), Lesion of liver (04/20/2019), Mild reactive airways disease, Neoplasm of prostate (04/16/2013), Neoplasm of prostate, malignant (HCC) (01/06/2010), On amiodarone therapy (09/07/2013), On continuous  oral anticoagulation (01/28/2018), Other activity(E029.9) (04/20/2013), Overweight (BMI 25.0-29.9) (10/22/2016), Persistent atrial fibrillation (HCC) (04/16/2013), S/P TKR (total knee replacement), bilateral, Secondary hypercoagulable state (04/09/2019), Tinea cruris (04/16/2013), Type 2 diabetes mellitus with diabetic polyneuropathy, without long-term current use of insulin  (HCC) (04/16/2013), and Vitamin D  deficiency (07/25/2018).   Surgical History:   Past Surgical History:  Procedure Laterality Date   ANTERIOR LAT LUMBAR FUSION N/A 02/06/2024   Procedure: ANTERIOR PRONE LATERAL LUMBAR FUSION LUMBAR FOUR-LUMBAR FIVE;  Surgeon: Darnella Dorn SAUNDERS, MD;  Location: [facility] OR;  Service: Neurosurgery;  Laterality: N/A;   ATRIAL FIBRILLATION ABLATION N/A 03/12/2019   Procedure: ATRIAL FIBRILLATION ABLATION;  Surgeon: Inocencio Soyla Lunger, MD;  Location: [facility] INVASIVE CV LAB;  Service: Cardiovascular;  Laterality: N/A;   ATRIAL FIBRILLATION ABLATION N/A 04/29/2020   Procedure: ATRIAL FIBRILLATION ABLATION;  Surgeon: Inocencio Soyla Lunger, MD;  Location: [facility] INVASIVE CV LAB;  Service: Cardiovascular;  Laterality: N/A;   CARDIAC ELECTROPHYSIOLOGY STUDY AND ABLATION     CARDIOVERSION     CARDIOVERSION N/A  10/10/2018   Procedure: CARDIOVERSION;  Surgeon: Delford Maude BROCKS, MD;  Location: [facility] ENDOSCOPY;  Service: Cardiovascular;  Laterality: N/A;   CARDIOVERSION N/A 05/18/2020   Procedure: CARDIOVERSION;  Surgeon: Alveta Aleene PARAS, MD;  Location: [facility] ENDOSCOPY;  Service: Cardiovascular;  Laterality: N/A;   CARDIOVERSION N/A 12/09/2020   Procedure: CARDIOVERSION;  Surgeon: Pietro Redell RAMAN, MD;  Location: [facility] ENDOSCOPY;  Service: Cardiovascular;  Laterality: N/A;   CHOLECYSTECTOMY     PROSTATECTOMY     REPLACEMENT TOTAL KNEE BILATERAL       Social History:   reports that he has never smoked. He has never been exposed to tobacco smoke. He has never used smokeless tobacco. He reports that he does not currently use  alcohol after a past usage of about 14.0 standard drinks of alcohol per week. He reports that he does not use drugs.   Family History:  His family history includes Cancer in his father and mother; Depression in his father and mother; Early death in his father and mother.   Allergies Allergies[1]   Home Medications  Prior to Admission medications  Medication Sig Start Date End Date Taking? Authorizing Provider  apixaban  (ELIQUIS ) 5 MG TABS tablet Take 1 tablet (5 mg total) by mouth 2 (two) times daily. 08/02/23  Yes Theophilus Andrews, Tully GRADE, MD  atorvastatin  (LIPITOR) 20 MG tablet TAKE 1 TABLET DAILY (PLEASE SCHEDULE A PHYSICAL FOR MORE REFILLS) 12/09/23  Yes Panosh, Apolinar POUR, MD  allopurinol  (ZYLOPRIM ) 300 MG tablet TAKE 1 TABLET BY MOUTH EVERY DAY 01/13/24   Theophilus Andrews, Tully GRADE, MD  carvedilol  (COREG ) 3.125 MG tablet TAKE 1 TABLET BY MOUTH TWICE A DAY 12/31/23   Theophilus Andrews, Tully GRADE, MD  cyclobenzaprine  (FLEXERIL ) 10 MG tablet Take 1 tablet (10 mg total) by mouth 3 (three) times daily as needed for muscle spasms. 02/07/24   Darnella Dorn SAUNDERS, MD  docusate sodium  (COLACE) 100 MG capsule Take 1 capsule (100 mg total) by mouth daily as needed (please take daily while on narcotic pain medication). 02/07/24 02/06/25  Darnella Dorn SAUNDERS, MD  flecainide  (TAMBOCOR ) 100 MG tablet TAKE 1 TABLET BY MOUTH TWICE A DAY 10/24/23   Camnitz, Soyla Lunger, MD  fluticasone  (FLONASE ) 50 MCG/ACT nasal spray Place 2 sprays into both nostrils daily. 01/07/19   Rosario Leatrice FERNS, MD  gabapentin  (NEURONTIN ) 300 MG capsule Take one tablet in the morning, one tablet in the afternoon, and 2 tablets at bedtime 12/12/23   Theophilus Andrews, Tully GRADE, MD  hydrALAZINE  (APRESOLINE ) 25 MG tablet TAKE 1 TABLET (25 MG TOTAL) BY MOUTH 4 (FOUR) TIMES DAILY. Patient taking differently: Take 25 mg by mouth in the morning and at bedtime. 10/22/23   Krasowski, Robert J, MD  montelukast  (SINGULAIR ) 10 MG tablet Take 10 mg by  mouth in the morning.    [provider]  oxyCODONE  (OXY IR/ROXICODONE ) 5 MG immediate release tablet Take 1 tablet (5 mg total) by mouth every 6 (six) hours as needed for moderate pain (pain score 4-6). 02/07/24   Darnella Dorn SAUNDERS, MD  potassium chloride  SA (KLOR-CON  M20) 20 MEQ tablet Take 1 tablet (20 mEq total) by mouth daily. 09/18/23   Theophilus Andrews, Tully GRADE, MD  RESTASIS  0.05 % ophthalmic emulsion Place 1 drop into both eyes in the morning. 12/09/19   [provider]  torsemide  (DEMADEX ) 20 MG tablet Take 1 tablet (20 mg total) by mouth 2 (two) times daily. 05/03/23   Krasowski, Robert J, MD  Critical care time: 30 minutes     Trenity Pha, PA-C [facility] For pager details, please see AMION or use Epic chat  After 1900, please call [facility] for cross coverage needs 02/24/2024, 12:27 PM      [1]  Allergies Allergen Reactions   Amlodipine  Other (See Comments)    Severe edema, chest pain, nausea    Chlorhexidine  Gluconate Itching    Only itching with wipes, not with soap   "

## 2024-02-24 NOTE — Procedures (Signed)
 Interventional Radiology Procedure:   Indications: Post operative fluid collection  Procedure: CT guided aspiration of left psoas fluid collection  Findings: 1 ml of bloody fluid aspirated from left psoas collection   Complications: None     EBL: Minimal  Plan:  Fluid sent for culture   Charles Lloyd R. Philip, MD  Pager: [phone]

## 2024-02-24 NOTE — Plan of Care (Signed)
  Problem: Coping: Goal: Level of anxiety will decrease Outcome: Progressing   Problem: Pain Managment: Goal: General experience of comfort will improve and/or be controlled Outcome: Progressing   Problem: Skin Integrity: Goal: Risk for impaired skin integrity will decrease Outcome: Progressing

## 2024-02-25 ENCOUNTER — Ambulatory Visit: Admitting: Internal Medicine

## 2024-02-25 DIAGNOSIS — F10931 Alcohol use, unspecified with withdrawal delirium: Secondary | ICD-10-CM | POA: Diagnosis not present

## 2024-02-25 DIAGNOSIS — I4891 Unspecified atrial fibrillation: Secondary | ICD-10-CM | POA: Diagnosis not present

## 2024-02-25 DIAGNOSIS — G9341 Metabolic encephalopathy: Secondary | ICD-10-CM | POA: Diagnosis not present

## 2024-02-25 DIAGNOSIS — K6812 Psoas muscle abscess: Secondary | ICD-10-CM | POA: Diagnosis not present

## 2024-02-25 LAB — CBC
HCT: 27.9 % — ABNORMAL LOW (ref 39.0–52.0)
Hemoglobin: 9.2 g/dL — ABNORMAL LOW (ref 13.0–17.0)
MCH: 29.7 pg (ref 26.0–34.0)
MCHC: 33 g/dL (ref 30.0–36.0)
MCV: 90 fL (ref 80.0–100.0)
Platelets: 211 K/uL (ref 150–400)
RBC: 3.1 MIL/uL — ABNORMAL LOW (ref 4.22–5.81)
RDW: 16.5 % — ABNORMAL HIGH (ref 11.5–15.5)
WBC: 3.5 K/uL — ABNORMAL LOW (ref 4.0–10.5)
nRBC: 0 % (ref 0.0–0.2)

## 2024-02-25 LAB — GLUCOSE, CAPILLARY
Glucose-Capillary: 93 mg/dL (ref 70–99)
Glucose-Capillary: 93 mg/dL (ref 70–99)
Glucose-Capillary: 115 mg/dL — ABNORMAL HIGH (ref 70–99)
Glucose-Capillary: 88 mg/dL (ref 70–99)
Glucose-Capillary: 94 mg/dL (ref 70–99)
Glucose-Capillary: 100 mg/dL — ABNORMAL HIGH (ref 70–99)

## 2024-02-25 LAB — BASIC METABOLIC PANEL WITH GFR
Anion gap: 11 (ref 5–15)
BUN: 10 mg/dL (ref 8–23)
CO2: 20 mmol/L — ABNORMAL LOW (ref 22–32)
Calcium: 8.5 mg/dL — ABNORMAL LOW (ref 8.9–10.3)
Chloride: 99 mmol/L (ref 98–111)
Creatinine, Ser: 0.78 mg/dL (ref 0.61–1.24)
GFR, Estimated: >60 mL/min
Glucose, Bld: 102 mg/dL — ABNORMAL HIGH (ref 70–99)
Potassium: 4 mmol/L (ref 3.5–5.1)
Sodium: 129 mmol/L — ABNORMAL LOW (ref 135–145)

## 2024-02-25 MED ORDER — FLECAINIDE ACETATE 100 MG PO TABS
100.0000 mg | ORAL_TABLET | Freq: Two times a day (BID) | ORAL | Status: DC
  Administered 2024-02-25 – 2024-03-07 (×23): 100 mg via ORAL
  Filled 2024-02-25 (×26): qty 1

## 2024-02-25 MED ORDER — GABAPENTIN 300 MG PO CAPS
300.0000 mg | ORAL_CAPSULE | Freq: Every day | ORAL | Status: DC
  Administered 2024-02-26 – 2024-03-07 (×10): 300 mg via ORAL
  Filled 2024-02-25 (×11): qty 1

## 2024-02-25 MED ORDER — CYCLOBENZAPRINE HCL 10 MG PO TABS
10.0000 mg | ORAL_TABLET | Freq: Three times a day (TID) | ORAL | Status: DC | PRN
  Administered 2024-02-25 – 2024-03-06 (×6): 10 mg via ORAL
  Filled 2024-02-25 (×7): qty 1

## 2024-02-25 MED ORDER — FOLIC ACID 1 MG PO TABS
1.0000 mg | ORAL_TABLET | Freq: Every day | ORAL | Status: DC
  Administered 2024-02-26 – 2024-03-10 (×12): 1 mg via ORAL
  Filled 2024-02-25 (×12): qty 1

## 2024-02-25 MED ORDER — CARVEDILOL 3.125 MG PO TABS
3.1250 mg | ORAL_TABLET | Freq: Two times a day (BID) | ORAL | Status: DC
  Administered 2024-02-25 – 2024-03-31 (×60): 3.125 mg via ORAL
  Filled 2024-02-25 (×63): qty 1

## 2024-02-25 MED ORDER — GABAPENTIN 300 MG PO CAPS
600.0000 mg | ORAL_CAPSULE | Freq: Every evening | ORAL | Status: DC
  Administered 2024-02-25 – 2024-03-07 (×12): 600 mg via ORAL
  Filled 2024-02-25 (×12): qty 2

## 2024-02-25 MED ORDER — CARVEDILOL 3.125 MG PO TABS: 3.1250 mg | ORAL_TABLET | Freq: Two times a day (BID) | ORAL | Status: DC

## 2024-02-25 MED ORDER — HYDRALAZINE HCL 25 MG PO TABS
25.0000 mg | ORAL_TABLET | Freq: Two times a day (BID) | ORAL | Status: DC
  Administered 2024-02-25 – 2024-03-31 (×59): 25 mg via ORAL
  Filled 2024-02-25 (×62): qty 1

## 2024-02-25 MED ORDER — FLECAINIDE ACETATE 100 MG PO TABS: 100.0000 mg | ORAL_TABLET | Freq: Two times a day (BID) | ORAL | Status: DC

## 2024-02-25 MED ORDER — HYDRALAZINE HCL 25 MG PO TABS: 25.0000 mg | ORAL_TABLET | Freq: Two times a day (BID) | ORAL | Status: DC

## 2024-02-25 MED ORDER — FLECAINIDE ACETATE 100 MG PO TABS
100.0000 mg | ORAL_TABLET | Freq: Two times a day (BID) | ORAL | Status: DC
  Filled 2024-02-25: qty 1

## 2024-02-25 MED ORDER — ATORVASTATIN CALCIUM 20 MG PO TABS
20.0000 mg | ORAL_TABLET | Freq: Every day | ORAL | Status: DC
  Administered 2024-02-25 – 2024-03-11 (×13): 20 mg via ORAL
  Filled 2024-02-25 (×13): qty 1

## 2024-02-25 MED ORDER — MONTELUKAST SODIUM 10 MG PO TABS
10.0000 mg | ORAL_TABLET | Freq: Every day | ORAL | Status: DC
  Administered 2024-02-25 – 2024-03-30 (×32): 10 mg via ORAL
  Filled 2024-02-25 (×32): qty 1

## 2024-02-25 MED ORDER — LACTATED RINGERS IV SOLN: INTRAVENOUS | Status: AC

## 2024-02-25 NOTE — Progress Notes (Signed)
 Update to PCCM progress note:  Patient off of precedex  since this morning, now awake and alert, oriented, calm. PO medications resumed, diet to be advanced, remains on phenobarbital  taper. Suspect large component of hospital delirium from lack of sleep since admission. Patient downgraded to stepdown tonight, TRH to pick up tomorrow. Updated family at bedside.  Jacoya Bauman, PA-C [facility] For pager details, please see AMION or use Epic chat  After 1900, please call [facility] for cross coverage needs 02/25/2024, 2:42 PM

## 2024-02-25 NOTE — Progress Notes (Signed)
 "   NAME:  Charles Kenneth., MRN:  [id number], DOB:  Mar 28, 1940, LOS: 3 ADMISSION DATE:  02/22/2024, CONSULTATION DATE:  02/24/24 REFERRING MD:  hospitalist, CHIEF COMPLAINT:  altered mental status   History of Present Illness:  Charles Hinde. is a 83 y.o. male with significant PMH of hypertension, hyperlipidemia, type 2 diabetes, atrial fibrillation on anticoagulation, s/p recent L4-5 trans psoas lumbar interbody fusion left-sided approach on 02/06/2024, who presented to the emergency department on 02/22/2024 for leg pain and weakness, with family reporting multiple falls over the past 6 to 8 months.  MRI lumbar spine revealed a new 3.5 x 2.4 cm fluid collection in the left psoas muscle concerning for abscess.  Neurosurgery was consulted at that time and recommended IV antibiotics and possible aspiration of abscess.   Patient has developed worsening confusion since 12/28, and CCM was consulted for further evaluation and management.  Patient lying in bed, not following commands, in bilateral upper extremity mittens, but moving all extremities.  Patient is not able to contribute to history or review of systems due to current mental status.  Patient's daughter and son-in-law is present in the room that provides history.  They state that the patient has not slept since admission, they noticed more confusion yesterday that has since worsened.  Daughter states that patient drinks approximately 2 shots of alcohol daily in addition to going to local bars and drinking an unknown amount of alcohol.  Family states that they are not aware of any history of alcohol withdrawal or delirium in the past during previous hospitalizations, and state the patient is usually very mentally sharp.  Pertinent  Medical History   has a past medical history of Acute tracheobronchitis (01/05/2019), B12 deficiency, Basal cell carcinoma (BCC) of left side of nose (01/28/2018), Cancer (HCC), Cardiomyopathy (HCC) (03/10/2018), CHF  (congestive heart failure) (HCC) (04/16/2013), Chronic anticoagulation, Chronic diastolic (congestive) heart failure (HCC) (04/16/2013), Chronic diastolic CHF (congestive heart failure) (HCC) (04/16/2013), Colon polyps (04/16/2013), Coronary artery disease involving native coronary artery of native heart without angina pectoris (04/05/2022), Diabetic neuropathy (HCC), Difficult intubation, Dysrhythmia, Eosinophil count raised (02/17/2019), Essential hypertension (01/06/2010), Gout, 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), Hypercholesterolemia (01/06/2010), Hyperlipidemia associated with type 2 diabetes mellitus (HCC) (01/28/2018), Hypertensive heart disease with heart failure (HCC) (01/06/2010), Hyponatremia (12/17/2018), 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), Infection of prosthetic right knee joint, Insomnia (03/04/2012), Lesion of liver (04/20/2019), Mild reactive airways disease, Neoplasm of prostate (04/16/2013), Neoplasm of prostate, 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   Significant Hospital Events: Including procedures, antibiotic start and stop dates in addition to other pertinent events   12/29 consulted for altered mental status, moved to ICU and started on precedex  and phenobarbital  taper  Interim History / Subjective:   Patient lying in bed this morning, much more calm, will open eyes to verbal stimuli but does not follow additional commands.   Objective    Blood pressure (!) 155/55, pulse (!) 56, temperature 97.6 F (36.4  C), temperature source Axillary, resp. rate 18, height 5' 8 (1.727 m), weight 79.3 kg, SpO2 99%.        Intake/Output Summary (Last 24 hours) at 02/25/2024 0825 Last data filed at 02/25/2024 0600 Gross per  24 hour  Intake 2124.73 ml  Output 505 ml  Net 1619.73 ml   Filed Weights   02/22/24 1238 02/24/24 1245  Weight: 79.4 kg 79.3 kg    Examination: General: elderly male, lying in bed, moaning and will briefly open eyes HENT: [place]/AT, pupils PEARL Lungs: clear bilaterally  Cardiovascular: RRR  Abdomen: non distended, non tender Extremities: no deformity  Neuro: pupils PEARL, non verbal due to altered mental status, moaning and briefly opens eyes   Resolved problem list   Assessment and Plan   Acute Metabolic Encephalopathy, multifactorial  DDX: hospital delirium, alcohol withdrawal, infectious source -Patient reportedly became more confused 12/28. History of alcohol use (2 shots daily plus unknown amount of alcohol use at local bars often). No history of alcohol withdrawal per family. 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 No history of alcohol withdrawal per family.  -IV thiamine , IV folic acid  while NPO -Phenobarbital  taper  Left psoas abscess 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 -Empiric Zyvox  and Zosyn  -Fluid culture with no growth at < 12 hours  History of recent falls -consider PT consult  Atrial Fibrillation Hypertension Chronic Diastolic Congestive Heart Failure -Eliquis  on hold since admission for IR drainage of psoas abscess -Switch  PO hydralazine  to IV hydralazine  10mg  q6h PRN -Low dose IV metoprolol  in place of PO carvedilol  3.125mg  BID -Flecainide  100mg  BID on hold while NPO, monitor and consider cardiology input  Type II Diabetes, non insulin  requiring Diabetic Neuropathy  -glucose monitoring with sliding scale insulin   -Hold gabapentin     Review of Systems:   Due to altered mental status, patient unable to contribute to review of systems.  Past Medical History:  He,  has a past medical history of Acute tracheobronchitis (01/05/2019), B12 deficiency, Basal cell carcinoma (BCC) of left side of nose (01/28/2018), Cancer (HCC), Cardiomyopathy (HCC) (03/10/2018), CHF (congestive heart failure) (HCC) (04/16/2013), Chronic anticoagulation, Chronic diastolic (congestive) heart failure (HCC) (04/16/2013), Chronic diastolic CHF (congestive heart failure) (HCC) (04/16/2013), Colon polyps (04/16/2013), Coronary artery disease involving native coronary artery of native heart without angina pectoris (04/05/2022), Diabetic neuropathy (HCC), Difficult intubation, Dysrhythmia, Eosinophil count raised (02/17/2019), Essential hypertension (01/06/2010), Gout, 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), Hypercholesterolemia (01/06/2010), Hyperlipidemia associated with type 2 diabetes mellitus (HCC) (01/28/2018), Hypertensive heart disease with heart failure (HCC) (01/06/2010), Hyponatremia (12/17/2018), 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), Infection of prosthetic right knee joint, Insomnia (03/04/2012), Lesion of liver (04/20/2019), Mild reactive airways disease, Neoplasm of prostate (04/16/2013), Neoplasm of prostate, malignant (HCC) (01/06/2010), On amiodarone therapy (09/07/2013), On continuous oral anticoagulation (01/28/2018), Other activity(E029.9) (04/20/2013), Overweight (BMI 25.0-29.9) (10/22/2016), Persistent atrial fibrillation (HCC) (04/16/2013), S/P TKR (total knee replacement), bilateral, Secondary hypercoagulable state (04/09/2019),  Tinea cruris (04/16/2013), Type 2 diabetes mellitus with diabetic polyneuropathy, without long-term current use of insulin  (HCC) (04/16/2013), and Vitamin D  deficiency (07/25/2018).   Surgical History:   Past Surgical History:  Procedure Laterality Date   ANTERIOR LAT LUMBAR FUSION N/A 02/06/2024   Procedure: ANTERIOR PRONE LATERAL LUMBAR FUSION LUMBAR FOUR-LUMBAR FIVE;  Surgeon: Darnella Dorn SAUNDERS, MD;  Location: [facility] OR;  Service: Neurosurgery;  Laterality: N/A;   ATRIAL FIBRILLATION ABLATION N/A 03/12/2019  Procedure: ATRIAL FIBRILLATION ABLATION;  Surgeon: Inocencio Soyla Lunger, MD;  Location: [facility] INVASIVE CV LAB;  Service: Cardiovascular;  Laterality: N/A;   ATRIAL FIBRILLATION ABLATION N/A 04/29/2020   Procedure: ATRIAL FIBRILLATION ABLATION;  Surgeon: Inocencio Soyla Lunger, MD;  Location: [facility] INVASIVE CV LAB;  Service: Cardiovascular;  Laterality: N/A;   CARDIAC ELECTROPHYSIOLOGY STUDY AND ABLATION     CARDIOVERSION     CARDIOVERSION N/A 10/10/2018   Procedure: CARDIOVERSION;  Surgeon: Delford Maude BROCKS, MD;  Location: [facility] ENDOSCOPY;  Service: Cardiovascular;  Laterality: N/A;   CARDIOVERSION N/A 05/18/2020   Procedure: CARDIOVERSION;  Surgeon: Alveta Aleene PARAS, MD;  Location: [facility] ENDOSCOPY;  Service: Cardiovascular;  Laterality: N/A;   CARDIOVERSION N/A 12/09/2020   Procedure: CARDIOVERSION;  Surgeon: Pietro Redell RAMAN, MD;  Location: [facility] ENDOSCOPY;  Service: Cardiovascular;  Laterality: N/A;   CHOLECYSTECTOMY     PROSTATECTOMY     REPLACEMENT TOTAL KNEE BILATERAL       Social History:   reports that he has never smoked. He has never been exposed to tobacco smoke. He has never used smokeless tobacco. He reports that he does not currently use alcohol after a past usage of about 14.0 standard drinks of alcohol per week. He reports that he does not use drugs.   Family History:  His family history includes Cancer in his father and mother; Depression in his father and mother; Early death in his  father and mother.   Allergies Allergies[1]   Home Medications  Prior to Admission medications  Medication Sig Start Date End Date Taking? Authorizing Provider  apixaban  (ELIQUIS ) 5 MG TABS tablet Take 1 tablet (5 mg total) by mouth 2 (two) times daily. 08/02/23  Yes Theophilus Andrews, Tully GRADE, MD  atorvastatin  (LIPITOR) 20 MG tablet TAKE 1 TABLET DAILY (PLEASE SCHEDULE A PHYSICAL FOR MORE REFILLS) 12/09/23  Yes Panosh, Apolinar POUR, MD  allopurinol  (ZYLOPRIM ) 300 MG tablet TAKE 1 TABLET BY MOUTH EVERY DAY 01/13/24   Theophilus Andrews, Tully GRADE, MD  carvedilol  (COREG ) 3.125 MG tablet TAKE 1 TABLET BY MOUTH TWICE A DAY 12/31/23   Theophilus Andrews, Tully GRADE, MD  cyclobenzaprine  (FLEXERIL ) 10 MG tablet Take 1 tablet (10 mg total) by mouth 3 (three) times daily as needed for muscle spasms. 02/07/24   Darnella Dorn SAUNDERS, MD  docusate sodium  (COLACE) 100 MG capsule Take 1 capsule (100 mg total) by mouth daily as needed (please take daily while on narcotic pain medication). 02/07/24 02/06/25  Darnella Dorn SAUNDERS, MD  flecainide  (TAMBOCOR ) 100 MG tablet TAKE 1 TABLET BY MOUTH TWICE A DAY 10/24/23   Camnitz, Soyla Lunger, MD  fluticasone  (FLONASE ) 50 MCG/ACT nasal spray Place 2 sprays into both nostrils daily. 01/07/19   Rosario Leatrice FERNS, MD  gabapentin  (NEURONTIN ) 300 MG capsule Take one tablet in the morning, one tablet in the afternoon, and 2 tablets at bedtime 12/12/23   Theophilus Andrews, Tully GRADE, MD  hydrALAZINE  (APRESOLINE ) 25 MG tablet TAKE 1 TABLET (25 MG TOTAL) BY MOUTH 4 (FOUR) TIMES DAILY. Patient taking differently: Take 25 mg by mouth in the morning and at bedtime. 10/22/23   Krasowski, Robert J, MD  montelukast  (SINGULAIR ) 10 MG tablet Take 10 mg by mouth in the morning.    [provider]  oxyCODONE  (OXY IR/ROXICODONE ) 5 MG immediate release tablet Take 1 tablet (5 mg total) by mouth every 6 (six) hours as needed for moderate pain (pain score 4-6). 02/07/24   Darnella Dorn SAUNDERS, MD  potassium  chloride SA (KLOR-CON   M20) 20 MEQ tablet Take 1 tablet (20 mEq total) by mouth daily. 09/18/23   Theophilus Andrews, Tully GRADE, MD  RESTASIS  0.05 % ophthalmic emulsion Place 1 drop into both eyes in the morning. 12/09/19   [provider]  torsemide  (DEMADEX ) 20 MG tablet Take 1 tablet (20 mg total) by mouth 2 (two) times daily. 05/03/23   Bernie Lamar PARAS, MD     Critical care time: 30 minutes     Ondre Salvetti, PA-C [facility] For pager details, please see AMION or use Epic chat  After 1900, please call [facility] for cross coverage needs 02/25/2024, 8:25 AM        [1]  Allergies Allergen Reactions   Amlodipine  Nausea Only, Swelling and Other (See Comments)    Severe edema and chest pain- also   Ativan  [Lorazepam ] Other (See Comments)    Made the patient fidgety and he hallucinates   Chlorhexidine  Gluconate Itching and Other (See Comments)    Only itching with wipes, not with soap   "

## 2024-02-25 NOTE — Plan of Care (Signed)
   Problem: Health Behavior/Discharge Planning: Goal: Ability to identify and utilize available resources and services will improve Outcome: Not Progressing Goal: Ability to manage health-related needs will improve Outcome: Not Progressing

## 2024-02-25 NOTE — Evaluation (Signed)
 Physical Therapy Evaluation Patient Details Name: Charles Lloyd. MRN: [id number] DOB: 07-20-1940 Today's Date: 02/25/2024  History of Present Illness  Charles Lloyd. is a 83 y.o. male who presented to the emergency department on 02/22/2024 for leg pain and weakness, with family reporting multiple falls over the past 6 to 8 months.  MRI lumbar spine revealed a new 3.5 x 2.4 cm fluid collection in the left psoas muscle concerning for abscess.  Neurosurgery was consulted at that time and recommended IV antibiotics and possible aspiration of abscess.  with significant PMH of hypertension, hyperlipidemia, type 2 diabetes, atrial fibrillation on anticoagulation, s/p recent L4-5 trans psoas lumbar interbody fusion left-sided approach on 02/06/2024, CHF,gout,CAD, TKA with infection, neuropathy  Clinical Impression  Pt admitted with above diagnosis.  Pt currently with functional limitations due to the deficits listed below (see PT Problem List). Pt will benefit from acute skilled PT to increase their independence and safety with mobility to allow discharge.       The patient is lethargic but arouses when spoken to. Attempted assessment of LE motor function and sensation with limited  ability to participate by patient . Attempted bed mobility, patient  declining and reporting increased pain. Patient frequently dozing.PT will return tomorrow to  assess mobility.  Patient will benefit from continued inpatient follow up therapy, <3 hours/day     If plan is discharge home, recommend the following: Two people to help with walking and/or transfers;Two people to help with bathing/dressing/bathroom;Assist for transportation;Help with stairs or ramp for entrance;Assistance with cooking/housework   Can travel by private vehicle        Equipment Recommendations None recommended by PT  Recommendations for Other Services       Functional Status Assessment Patient has had a recent decline in their  functional status and demonstrates the ability to make significant improvements in function in a reasonable and predictable amount of time.     Precautions / Restrictions Precautions Precautions: Back;Fall Recall of Precautions/Restrictions: Impaired      Mobility  Bed Mobility   Bed Mobility: Rolling, Supine to Sit           General bed mobility comments: attempted but patient declining and not  willing t proceed, reporting pain    Transfers                        Ambulation/Gait                  Stairs            Wheelchair Mobility     Tilt Bed    Modified Rankin (Stroke Patients Only)       Balance Overall balance assessment: History of Falls (unable to test  with limited ability to participate)                                           Pertinent Vitals/Pain Pain Assessment Pain Assessment: Faces Faces Pain Scale: Hurts even more Pain Location: legs and back Pain Descriptors / Indicators: Aching, Grimacing, Guarding, Sore Pain Intervention(s): Monitored during session, Limited activity within patient's tolerance    Home Living Family/patient expects to be discharged to:: Skilled nursing facility Living Arrangements: Alone Available Help at Discharge: Family;Available PRN/intermittently Type of Home: House Home Access: Stairs to enter   Entrance Stairs-Number of Steps: 1 Alternate  Level Stairs-Number of Steps: stair lift Home Layout: Two level;Bed/bath upstairs   Additional Comments: no handles on toilet riser, 2 rollators (one for upstairs and one for downstairs)    Prior Function Prior Level of Function : Patient poor historian/Family not available             Mobility Comments: 2 RW 's has had falls ADLs Comments: Drives, unsure since back surg, pt unable to state     Extremity/Trunk Assessment   Upper Extremity Assessment Upper Extremity Assessment: Generalized weakness    Lower Extremity  Assessment Lower Extremity Assessment: RLE deficits/detail;LLE deficits/detail RLE Deficits / Details: 1/5? due to lethargy, limited knee/hip flexion LLE Deficits / Details: dorsiflex2/5  limited hip/knee flex, too lthargic to test sensation    Cervical / Trunk Assessment Cervical / Trunk Assessment: Back Surgery;Other exceptions Cervical / Trunk Exceptions: increased pain reported with  HOB raised  Communication   Communication Communication: Impaired Factors Affecting Communication: Reduced clarity of speech;Difficulty expressing self    Cognition Arousal: Lethargic, Suspect due to medications                             PT - Cognition Comments: Precedex  stopped in AM, sytates that he is too sleepy Following commands: Impaired Following commands impaired: Follows one step commands inconsistently     Cueing Cueing Techniques: Verbal cues     General Comments      Exercises     Assessment/Plan    PT Assessment Patient needs continued PT services  PT Problem List Decreased strength;Decreased activity tolerance;Decreased balance;Decreased mobility;Decreased knowledge of use of DME;Decreased safety awareness;Decreased knowledge of precautions;Pain       PT Treatment Interventions DME instruction;Gait training;Functional mobility training;Therapeutic activities;Therapeutic exercise;Cognitive remediation;Patient/family education    PT Goals (Current goals can be found in the Care Plan section)  Acute Rehab PT Goals PT Goal Formulation: Patient unable to participate in goal setting Time For Goal Achievement: 03/10/24 Potential to Achieve Goals: Fair    Frequency Min 2X/week     Co-evaluation               AM-PAC PT 6 Clicks Mobility  Outcome Measure Help needed turning from your back to your side while in a flat bed without using bedrails?: Total Help needed moving from lying on your back to sitting on the side of a flat bed without using  bedrails?: Total Help needed moving to and from a bed to a chair (including a wheelchair)?: Total Help needed standing up from a chair using your arms (e.g., wheelchair or bedside chair)?: Total Help needed to walk in hospital room?: Total Help needed climbing 3-5 steps with a railing? : Total 6 Click Score: 6    End of Session   Activity Tolerance: Patient limited by lethargy;Patient limited by pain Patient left: with call bell/phone within reach;in bed;with bed alarm set Nurse Communication: Mobility status PT Visit Diagnosis: Unsteadiness on feet (R26.81);Pain Pain - Right/Left: Left Pain - part of body: Leg    Time: 8491-8468 PT Time Calculation (min) (ACUTE ONLY): 23 min   Charges:   PT Evaluation $PT Eval Low Complexity: 1 Low PT Treatments $Therapeutic Activity: 8-22 mins PT General Charges $$ ACUTE PT VISIT: 1 Visit         Charles Lloyd PT Acute Rehabilitation Services Office [phone]   Charles Lloyd 02/25/2024, 4:31 PM

## 2024-02-25 NOTE — Consult Note (Deleted)
 "   NAME:  Charles Flinn., MRN:  [id number], DOB:  Jul 15, 1940, LOS: 3 ADMISSION DATE:  02/22/2024, CONSULTATION DATE:  02/24/24 REFERRING MD:  hospitalist, CHIEF COMPLAINT:  altered mental status   History of Present Illness:  Charles Lloyd. is a 83 y.o. male with significant PMH of hypertension, hyperlipidemia, type 2 diabetes, atrial fibrillation on anticoagulation, s/p recent L4-5 trans psoas lumbar interbody fusion left-sided approach on 02/06/2024, who presented to the emergency department on 02/22/2024 for leg pain and weakness, with family reporting multiple falls over the past 6 to 8 months.  MRI lumbar spine revealed a new 3.5 x 2.4 cm fluid collection in the left psoas muscle concerning for abscess.  Neurosurgery was consulted at that time and recommended IV antibiotics and possible aspiration of abscess.   Patient has developed worsening confusion since 12/28, and CCM was consulted for further evaluation and management.  Patient lying in bed, not following commands, in bilateral upper extremity mittens, but moving all extremities.  Patient is not able to contribute to history or review of systems due to current mental status.  Patient's daughter and son-in-law is present in the room that provides history.  They state that the patient has not slept since admission, they noticed more confusion yesterday that has since worsened.  Daughter states that patient drinks approximately 2 shots of alcohol daily in addition to going to local bars and drinking an unknown amount of alcohol.  Family states that they are not aware of any history of alcohol withdrawal or delirium in the past during previous hospitalizations, and state the patient is usually very mentally sharp.  Pertinent  Medical History   has a past medical history of Acute tracheobronchitis (01/05/2019), B12 deficiency, Basal cell carcinoma (BCC) of left side of nose (01/28/2018), Cancer (HCC), Cardiomyopathy (HCC) (03/10/2018), CHF  (congestive heart failure) (HCC) (04/16/2013), Chronic anticoagulation, Chronic diastolic (congestive) heart failure (HCC) (04/16/2013), Chronic diastolic CHF (congestive heart failure) (HCC) (04/16/2013), Colon polyps (04/16/2013), Coronary artery disease involving native coronary artery of native heart without angina pectoris (04/05/2022), Diabetic neuropathy (HCC), Difficult intubation, Dysrhythmia, Eosinophil count raised (02/17/2019), Essential hypertension (01/06/2010), Gout, 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), Hypercholesterolemia (01/06/2010), Hyperlipidemia associated with type 2 diabetes mellitus (HCC) (01/28/2018), Hypertensive heart disease with heart failure (HCC) (01/06/2010), Hyponatremia (12/17/2018), 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), Infection of prosthetic right knee joint, Insomnia (03/04/2012), Lesion of liver (04/20/2019), Mild reactive airways disease, Neoplasm of prostate (04/16/2013), Neoplasm of prostate, 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   Significant Hospital Events: Including procedures, antibiotic start and stop dates in addition to other pertinent events   12/29 consulted for altered mental status, moved to ICU and started on precedex  and phenobarbital  taper  Interim History / Subjective:   Patient lying in bed this morning, much more calm, will open eyes to verbal stimuli but does not follow additional commands.   Objective    Blood pressure (!) 155/55, pulse (!) 56, temperature 97.6 F (36.4  C), temperature source Axillary, resp. rate 18, height 5' 8 (1.727 m), weight 79.3 kg, SpO2 99%.        Intake/Output Summary (Last 24 hours) at 02/25/2024 0824 Last data filed at 02/25/2024 0600 Gross per  24 hour  Intake 2124.73 ml  Output 505 ml  Net 1619.73 ml   Filed Weights   02/22/24 1238 02/24/24 1245  Weight: 79.4 kg 79.3 kg    Examination: General: elderly male, lying in bed, moaning and will briefly open eyes HENT: [place]/AT, pupils PEARL Lungs: clear bilaterally  Cardiovascular: RRR  Abdomen: non distended, non tender Extremities: no deformity  Neuro: pupils PEARL, non verbal due to altered mental status, moaning and briefly opens eyes   Resolved problem list   Assessment and Plan   Acute Metabolic Encephalopathy, multifactorial  DDX: hospital delirium, alcohol withdrawal, infectious source -Patient reportedly became more confused 12/28. History of alcohol use (2 shots daily plus unknown amount of alcohol use at local bars often). No history of alcohol withdrawal per family. 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 No history of alcohol withdrawal per family.  -IV thiamine , IV folic acid  while NPO -Phenobarbital  taper  Left psoas abscess 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 -Empiric Zyvox  and Zosyn  -Fluid culture with no growth at < 12 hours  History of recent falls -consider PT consult  Atrial Fibrillation Hypertension Chronic Diastolic Congestive Heart Failure -Eliquis  on hold since admission for IR drainage of psoas abscess -Switch  PO hydralazine  to IV hydralazine  10mg  q6h PRN -Low dose IV metoprolol  in place of PO carvedilol  3.125mg  BID -Flecainide  100mg  BID on hold while NPO, monitor and consider cardiology input  Type II Diabetes, non insulin  requiring Diabetic Neuropathy  -glucose monitoring with sliding scale insulin   -Hold gabapentin     Review of Systems:   Due to altered mental status, patient unable to contribute to review of systems.  Past Medical History:  He,  has a past medical history of Acute tracheobronchitis (01/05/2019), B12 deficiency, Basal cell carcinoma (BCC) of left side of nose (01/28/2018), Cancer (HCC), Cardiomyopathy (HCC) (03/10/2018), CHF (congestive heart failure) (HCC) (04/16/2013), Chronic anticoagulation, Chronic diastolic (congestive) heart failure (HCC) (04/16/2013), Chronic diastolic CHF (congestive heart failure) (HCC) (04/16/2013), Colon polyps (04/16/2013), Coronary artery disease involving native coronary artery of native heart without angina pectoris (04/05/2022), Diabetic neuropathy (HCC), Difficult intubation, Dysrhythmia, Eosinophil count raised (02/17/2019), Essential hypertension (01/06/2010), Gout, 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), Hypercholesterolemia (01/06/2010), Hyperlipidemia associated with type 2 diabetes mellitus (HCC) (01/28/2018), Hypertensive heart disease with heart failure (HCC) (01/06/2010), Hyponatremia (12/17/2018), 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), Infection of prosthetic right knee joint, Insomnia (03/04/2012), Lesion of liver (04/20/2019), Mild reactive airways disease, Neoplasm of prostate (04/16/2013), Neoplasm of prostate, malignant (HCC) (01/06/2010), On amiodarone therapy (09/07/2013), On continuous oral anticoagulation (01/28/2018), Other activity(E029.9) (04/20/2013), Overweight (BMI 25.0-29.9) (10/22/2016), Persistent atrial fibrillation (HCC) (04/16/2013), S/P TKR (total knee replacement), bilateral, Secondary hypercoagulable state (04/09/2019),  Tinea cruris (04/16/2013), Type 2 diabetes mellitus with diabetic polyneuropathy, without long-term current use of insulin  (HCC) (04/16/2013), and Vitamin D  deficiency (07/25/2018).   Surgical History:   Past Surgical History:  Procedure Laterality Date   ANTERIOR LAT LUMBAR FUSION N/A 02/06/2024   Procedure: ANTERIOR PRONE LATERAL LUMBAR FUSION LUMBAR FOUR-LUMBAR FIVE;  Surgeon: Darnella Dorn SAUNDERS, MD;  Location: [facility] OR;  Service: Neurosurgery;  Laterality: N/A;   ATRIAL FIBRILLATION ABLATION N/A 03/12/2019  Procedure: ATRIAL FIBRILLATION ABLATION;  Surgeon: Inocencio Soyla Lunger, MD;  Location: [facility] INVASIVE CV LAB;  Service: Cardiovascular;  Laterality: N/A;   ATRIAL FIBRILLATION ABLATION N/A 04/29/2020   Procedure: ATRIAL FIBRILLATION ABLATION;  Surgeon: Inocencio Soyla Lunger, MD;  Location: [facility] INVASIVE CV LAB;  Service: Cardiovascular;  Laterality: N/A;   CARDIAC ELECTROPHYSIOLOGY STUDY AND ABLATION     CARDIOVERSION     CARDIOVERSION N/A 10/10/2018   Procedure: CARDIOVERSION;  Surgeon: Delford Maude BROCKS, MD;  Location: [facility] ENDOSCOPY;  Service: Cardiovascular;  Laterality: N/A;   CARDIOVERSION N/A 05/18/2020   Procedure: CARDIOVERSION;  Surgeon: Alveta Aleene PARAS, MD;  Location: [facility] ENDOSCOPY;  Service: Cardiovascular;  Laterality: N/A;   CARDIOVERSION N/A 12/09/2020   Procedure: CARDIOVERSION;  Surgeon: Pietro Redell RAMAN, MD;  Location: [facility] ENDOSCOPY;  Service: Cardiovascular;  Laterality: N/A;   CHOLECYSTECTOMY     PROSTATECTOMY     REPLACEMENT TOTAL KNEE BILATERAL       Social History:   reports that he has never smoked. He has never been exposed to tobacco smoke. He has never used smokeless tobacco. He reports that he does not currently use alcohol after a past usage of about 14.0 standard drinks of alcohol per week. He reports that he does not use drugs.   Family History:  His family history includes Cancer in his father and mother; Depression in his father and mother; Early death in his  father and mother.   Allergies Allergies[1]   Home Medications  Prior to Admission medications  Medication Sig Start Date End Date Taking? Authorizing Provider  apixaban  (ELIQUIS ) 5 MG TABS tablet Take 1 tablet (5 mg total) by mouth 2 (two) times daily. 08/02/23  Yes Theophilus Andrews, Tully GRADE, MD  atorvastatin  (LIPITOR) 20 MG tablet TAKE 1 TABLET DAILY (PLEASE SCHEDULE A PHYSICAL FOR MORE REFILLS) 12/09/23  Yes Panosh, Apolinar POUR, MD  allopurinol  (ZYLOPRIM ) 300 MG tablet TAKE 1 TABLET BY MOUTH EVERY DAY 01/13/24   Theophilus Andrews, Tully GRADE, MD  carvedilol  (COREG ) 3.125 MG tablet TAKE 1 TABLET BY MOUTH TWICE A DAY 12/31/23   Theophilus Andrews, Tully GRADE, MD  cyclobenzaprine  (FLEXERIL ) 10 MG tablet Take 1 tablet (10 mg total) by mouth 3 (three) times daily as needed for muscle spasms. 02/07/24   Darnella Dorn SAUNDERS, MD  docusate sodium  (COLACE) 100 MG capsule Take 1 capsule (100 mg total) by mouth daily as needed (please take daily while on narcotic pain medication). 02/07/24 02/06/25  Darnella Dorn SAUNDERS, MD  flecainide  (TAMBOCOR ) 100 MG tablet TAKE 1 TABLET BY MOUTH TWICE A DAY 10/24/23   Camnitz, Soyla Lunger, MD  fluticasone  (FLONASE ) 50 MCG/ACT nasal spray Place 2 sprays into both nostrils daily. 01/07/19   Rosario Leatrice FERNS, MD  gabapentin  (NEURONTIN ) 300 MG capsule Take one tablet in the morning, one tablet in the afternoon, and 2 tablets at bedtime 12/12/23   Theophilus Andrews, Tully GRADE, MD  hydrALAZINE  (APRESOLINE ) 25 MG tablet TAKE 1 TABLET (25 MG TOTAL) BY MOUTH 4 (FOUR) TIMES DAILY. Patient taking differently: Take 25 mg by mouth in the morning and at bedtime. 10/22/23   Krasowski, Robert J, MD  montelukast  (SINGULAIR ) 10 MG tablet Take 10 mg by mouth in the morning.    [provider]  oxyCODONE  (OXY IR/ROXICODONE ) 5 MG immediate release tablet Take 1 tablet (5 mg total) by mouth every 6 (six) hours as needed for moderate pain (pain score 4-6). 02/07/24   Darnella Dorn SAUNDERS, MD  potassium  chloride SA (KLOR-CON   M20) 20 MEQ tablet Take 1 tablet (20 mEq total) by mouth daily. 09/18/23   Theophilus Andrews, Tully GRADE, MD  RESTASIS  0.05 % ophthalmic emulsion Place 1 drop into both eyes in the morning. 12/09/19   [provider]  torsemide  (DEMADEX ) 20 MG tablet Take 1 tablet (20 mg total) by mouth 2 (two) times daily. 05/03/23   Bernie Lamar PARAS, MD     Critical care time: 30 minutes     Juniper Snyders, PA-C [facility] For pager details, please see AMION or use Epic chat  After 1900, please call [facility] for cross coverage needs 02/25/2024, 8:24 AM       [1]  Allergies Allergen Reactions   Amlodipine  Nausea Only, Swelling and Other (See Comments)    Severe edema and chest pain- also   Ativan  [Lorazepam ] Other (See Comments)    Made the patient fidgety and he hallucinates   Chlorhexidine  Gluconate Itching and Other (See Comments)    Only itching with wipes, not with soap   "

## 2024-02-26 DIAGNOSIS — I10 Essential (primary) hypertension: Secondary | ICD-10-CM | POA: Diagnosis not present

## 2024-02-26 DIAGNOSIS — K6812 Psoas muscle abscess: Secondary | ICD-10-CM | POA: Diagnosis not present

## 2024-02-26 DIAGNOSIS — I480 Paroxysmal atrial fibrillation: Secondary | ICD-10-CM | POA: Diagnosis not present

## 2024-02-26 DIAGNOSIS — I5032 Chronic diastolic (congestive) heart failure: Secondary | ICD-10-CM | POA: Diagnosis not present

## 2024-02-26 DIAGNOSIS — G9341 Metabolic encephalopathy: Secondary | ICD-10-CM | POA: Diagnosis not present

## 2024-02-26 DIAGNOSIS — F10931 Alcohol use, unspecified with withdrawal delirium: Secondary | ICD-10-CM | POA: Diagnosis not present

## 2024-02-26 LAB — BASIC METABOLIC PANEL WITH GFR
Anion gap: 11 (ref 5–15)
BUN: 9 mg/dL (ref 8–23)
CO2: 20 mmol/L — ABNORMAL LOW (ref 22–32)
Calcium: 8.3 mg/dL — ABNORMAL LOW (ref 8.9–10.3)
Chloride: 99 mmol/L (ref 98–111)
Creatinine, Ser: 0.73 mg/dL (ref 0.61–1.24)
GFR, Estimated: >60 mL/min
Glucose, Bld: 87 mg/dL (ref 70–99)
Potassium: 3.5 mmol/L (ref 3.5–5.1)
Sodium: 130 mmol/L — ABNORMAL LOW (ref 135–145)

## 2024-02-26 LAB — GLUCOSE, CAPILLARY
Glucose-Capillary: 90 mg/dL (ref 70–99)
Glucose-Capillary: 75 mg/dL (ref 70–99)
Glucose-Capillary: 103 mg/dL — ABNORMAL HIGH (ref 70–99)
Glucose-Capillary: 108 mg/dL — ABNORMAL HIGH (ref 70–99)
Glucose-Capillary: 133 mg/dL — ABNORMAL HIGH (ref 70–99)
Glucose-Capillary: 149 mg/dL — ABNORMAL HIGH (ref 70–99)

## 2024-02-26 LAB — CBC WITH DIFFERENTIAL/PLATELET
Abs Immature Granulocytes: 0.02 K/uL (ref 0.00–0.07)
Basophils Absolute: 0 K/uL (ref 0.0–0.1)
Basophils Relative: 0 %
Eosinophils Absolute: 0.3 K/uL (ref 0.0–0.5)
Eosinophils Relative: 7 %
HCT: 25.9 % — ABNORMAL LOW (ref 39.0–52.0)
Hemoglobin: 8.7 g/dL — ABNORMAL LOW (ref 13.0–17.0)
Immature Granulocytes: 1 %
Lymphocytes Relative: 17 %
Lymphs Abs: 0.7 K/uL (ref 0.7–4.0)
MCH: 30 pg (ref 26.0–34.0)
MCHC: 33.6 g/dL (ref 30.0–36.0)
MCV: 89.3 fL (ref 80.0–100.0)
Monocytes Absolute: 0.4 K/uL (ref 0.1–1.0)
Monocytes Relative: 11 %
Neutro Abs: 2.6 K/uL (ref 1.7–7.7)
Neutrophils Relative %: 64 %
Platelets: 204 K/uL (ref 150–400)
RBC: 2.9 MIL/uL — ABNORMAL LOW (ref 4.22–5.81)
RDW: 16.4 % — ABNORMAL HIGH (ref 11.5–15.5)
WBC: 4 K/uL (ref 4.0–10.5)
nRBC: 0 % (ref 0.0–0.2)

## 2024-02-26 LAB — MAGNESIUM: Magnesium: 1.7 mg/dL (ref 1.7–2.4)

## 2024-02-26 MED ORDER — APIXABAN 5 MG PO TABS
5.0000 mg | ORAL_TABLET | Freq: Two times a day (BID) | ORAL | Status: DC
  Administered 2024-02-26 – 2024-03-07 (×21): 5 mg via ORAL
  Filled 2024-02-26 (×21): qty 1

## 2024-02-26 MED ORDER — PHENOBARBITAL 32.4 MG PO TABS
32.4000 mg | ORAL_TABLET | Freq: Three times a day (TID) | ORAL | Status: AC
  Administered 2024-02-26 – 2024-02-28 (×6): 32.4 mg via ORAL
  Filled 2024-02-26 (×6): qty 1

## 2024-02-26 MED ORDER — POTASSIUM CHLORIDE 20 MEQ PO PACK
40.0000 meq | PACK | Freq: Once | ORAL | Status: AC
  Administered 2024-02-26: 40 meq via ORAL
  Filled 2024-02-26: qty 2

## 2024-02-26 MED ORDER — MAGNESIUM SULFATE 4 GM/100ML IV SOLN
4.0000 g | Freq: Once | INTRAVENOUS | Status: AC
  Administered 2024-02-26: 4 g via INTRAVENOUS
  Filled 2024-02-26: qty 100

## 2024-02-26 NOTE — Plan of Care (Signed)
" °  Problem: Fluid Volume: Goal: Ability to maintain a balanced intake and output will improve Outcome: Progressing   Problem: Metabolic: Goal: Ability to maintain appropriate glucose levels will improve Outcome: Progressing   Problem: Skin Integrity: Goal: Risk for impaired skin integrity will decrease Outcome: Progressing   Problem: Clinical Measurements: Goal: Respiratory complications will improve Outcome: Progressing   Problem: Pain Managment: Goal: General experience of comfort will improve and/or be controlled Outcome: Progressing   Problem: Safety: Goal: Ability to remain free from injury will improve Outcome: Progressing   "

## 2024-02-26 NOTE — Progress Notes (Signed)
 eLink Physician-Brief Progress Note Patient Name: Charles Lloyd. DOB: 27-Jul-1940 MRN: [id number]   Date of Service  02/26/2024  HPI/Events of Note  57M with history of hypertension, hyperlipidemia, DMII and atrial fibrillation who is s/p L4-5 trans psoas lumbar interbody fusion on 12/11 who presented to ER with leg pain and weakness. MRI spine showing 3.5 x 2.4cm fluid collection in the left psoas muscle concerning for abscess.   potassium 3.5, mag 1.7, cret 0.73, FR >60  eICU Interventions  Kcl, Mag      Intervention Category Minor Interventions: Electrolytes abnormality - evaluation and management  Marshia Tropea 02/26/2024, 6:23 AM

## 2024-02-26 NOTE — Progress Notes (Signed)
 Physical Therapy Treatment Patient Details Name: Charles Lloyd. MRN: [id number] DOB: [birth year] Today's Date: 02/26/2024   History of Present Illness Charles Lloyd. is a 83 y.o. male who presented to the emergency department on 02/22/2024 for leg pain and weakness, with family reporting multiple falls over the past 6 to 8 months.  MRI lumbar spine revealed a new 3.5 x 2.4 cm fluid collection in the left psoas muscle concerning for abscess.  Neurosurgery was consulted at that time and recommended IV antibiotics and possible aspiration of abscess.  with significant PMH of hypertension, hyperlipidemia, type 2 diabetes, atrial fibrillation on anticoagulation, s/p recent L4-5 trans psoas lumbar interbody fusion left-sided approach on 02/06/2024, CHF,gout,CAD, TKA with infection, neuropathy    PT Comments   Pt admitted with above diagnosis.  Pt currently with functional limitations due to the deficits listed below (see PT Problem List). Pt seated in recliner when PT arrived, nursing had made therapist aware. Pt required mod A x 2 for sit to stand  from recliner, min A x 2 for safety, RW management and cues for SPT to return to bed, pt required max A for supine to sit. Pt left in bed and all needs in place with nurse tech present whom assisted with transfer tasks. Pt will benefit from acute skilled PT to increase their independence and safety with mobility to allow discharge.      If plan is discharge home, recommend the following: Two people to help with walking and/or transfers;Two people to help with bathing/dressing/bathroom;Assist for transportation;Help with stairs or ramp for entrance;Assistance with cooking/housework   Can travel by private vehicle        Equipment Recommendations  None recommended by PT    Recommendations for Other Services Rehab consult     Precautions / Restrictions Precautions Precautions: Fall Precaution Booklet Issued: Yes (comment) Recall of  Precautions/Restrictions: Impaired Precaution/Restrictions Comments: reviewed  log rolling Restrictions Weight Bearing Restrictions Per Provider Order: No     Mobility  Bed Mobility Overal bed mobility: Needs Assistance Bed Mobility: Sit to Supine       Sit to supine: Max assist   General bed mobility comments: max A for sit to supine with pt initiating movement with B LE and able to scoot to the R side once in supine    Transfers Overall transfer level: Needs assistance Equipment used: Rolling walker (2 wheels) Transfers: Bed to chair/wheelchair/BSC     Step pivot transfers: Mod assist, +2 physical assistance, +2 safety/equipment       General transfer comment: pt requesting to return to bed, mod A x 2 for sit to stand with pt able to push from recliner armrest with R UE, pt  required cues for advancing R LE and retrograde stepping pattern with the L, once in standing pt min A x 2 for safety and RW management as well as lines/leads, pt able to side step to the L x 2 feet for improved positioning in bed.    Ambulation/Gait                   Stairs             Wheelchair Mobility     Tilt Bed    Modified Rankin (Stroke Patients Only)       Balance Overall balance assessment: History of Falls Sitting-balance support: No upper extremity supported, Feet supported Sitting balance-Leahy Scale: Fair     Standing balance support: During functional activity, Reliant on  assistive device for balance, Bilateral upper extremity supported Standing balance-Leahy Scale: Poor                              Communication Communication Communication: No apparent difficulties Factors Affecting Communication: Difficulty expressing self  Cognition Arousal: Alert Behavior During Therapy: WFL for tasks assessed/performed   PT - Cognitive impairments: No apparent impairments                         Following commands: Intact Following  commands impaired: Only follows one step commands consistently    Cueing Cueing Techniques: Verbal cues  Exercises      General Comments General comments (skin integrity, edema, etc.): pt on 3 L/min and desaturated to 79% during the course of transfer task back to bed and requried 40s to recover to 90% once seated EOB and HR increased to 89      Pertinent Vitals/Pain Pain Assessment Pain Assessment: Faces Faces Pain Scale: Hurts little more Pain Location: legs and back Pain Descriptors / Indicators: Aching, Grimacing, Guarding, Sore Pain Intervention(s): Monitored during session, Repositioned    Home Living                          Prior Function            PT Goals (current goals can now be found in the care plan section) Acute Rehab PT Goals Patient Stated Goal: To be able to manage at home alone. PT Goal Formulation: Patient unable to participate in goal setting Time For Goal Achievement: 03/10/24 Potential to Achieve Goals: Fair Progress towards PT goals: Progressing toward goals    Frequency    Min 2X/week      PT Plan      Co-evaluation              AM-PAC PT 6 Clicks Mobility   Outcome Measure  Help needed turning from your back to your side while in a flat bed without using bedrails?: A Lot Help needed moving from lying on your back to sitting on the side of a flat bed without using bedrails?: A Lot Help needed moving to and from a bed to a chair (including a wheelchair)?: A Lot Help needed standing up from a chair using your arms (e.g., wheelchair or bedside chair)?: A Lot Help needed to walk in hospital room?: Total Help needed climbing 3-5 steps with a railing? : Total 6 Click Score: 10    End of Session Equipment Utilized During Treatment: Gait belt Activity Tolerance: Patient tolerated treatment well Patient left: with call bell/phone within reach;in bed;with nursing/sitter in room Nurse Communication: Mobility status PT  Visit Diagnosis: Unsteadiness on feet (R26.81);Pain Pain - Right/Left: Left Pain - part of body: Leg     Time: 8343-8287 PT Time Calculation (min) (ACUTE ONLY): 16 min  Charges:    $Therapeutic Activity: 8-22 mins PT General Charges $$ ACUTE PT VISIT: 1 Visit                     [place], PT Acute Rehab    [place] VEAR Drone 02/26/2024, 5:51 PM

## 2024-02-26 NOTE — Progress Notes (Signed)
 Physical Therapy Treatment Patient Details Name: Charles Lloyd. MRN: [id number] DOB: 05/12/1940 Today's Date: 02/26/2024   History of Present Illness Yassir Enis. is a 83 y.o. male who presented to the emergency department on 02/22/2024 for leg pain and weakness, with family reporting multiple falls over the past 6 to 8 months.  MRI lumbar spine revealed a new 3.5 x 2.4 cm fluid collection in the left psoas muscle concerning for abscess.  Neurosurgery was consulted at that time and recommended IV antibiotics and possible aspiration of abscess.  with significant PMH of hypertension, hyperlipidemia, type 2 diabetes, atrial fibrillation on anticoagulation, s/p recent L4-5 trans psoas lumbar interbody fusion left-sided approach on 02/06/2024, CHF,gout,CAD, TKA with infection, neuropathy    PT Comments  The patient is alert  and oriented to place and reason. Patient able to follow directions and very participatory.  Patient assisted to stand and took small steps to recliner with +2 mod support.   Patient will benefit from intensive inpatient follow-up therapy, >3 hours/day Patient on 3 L, 100%, 97 HR, BP 142/67     If plan is discharge home, recommend the following: Two people to help with walking and/or transfers;Two people to help with bathing/dressing/bathroom;Assist for transportation;Help with stairs or ramp for entrance;Assistance with cooking/housework   Can travel by private vehicle        Equipment Recommendations  None recommended by PT    Recommendations for Other Services Rehab consult     Precautions / Restrictions Precautions Precautions: Fall Recall of Precautions/Restrictions: Impaired Precaution/Restrictions Comments: reviewed  log rolling Restrictions Weight Bearing Restrictions Per Provider Order: No     Mobility  Bed Mobility   Bed Mobility: Rolling, Sidelying to Sit Rolling: Mod assist, Used rails Sidelying to sit: Max assist, +2 for physical  assistance, +2 for safety/equipment       General bed mobility comments: mod assist to get legs to bed edge, max to push to sitting    Transfers Overall transfer level: Needs assistance Equipment used: Rolling walker (2 wheels) Transfers: Sit to/from Stand, Bed to chair/wheelchair/BSC Sit to Stand: Mod assist, +2 physical assistance, +2 safety/equipment   Step pivot transfers: Mod assist, +2 physical assistance, +2 safety/equipment       General transfer comment: assisted to stand from bed raised, small shuffle steps, left knee blocked to ensure not buckling, decreased control of descent.    Ambulation/Gait                   Stairs             Wheelchair Mobility     Tilt Bed    Modified Rankin (Stroke Patients Only)       Balance Overall balance assessment: History of Falls Sitting-balance support: No upper extremity supported, Feet supported Sitting balance-Leahy Scale: Fair     Standing balance support: During functional activity, Reliant on assistive device for balance, Bilateral upper extremity supported Standing balance-Leahy Scale: Poor                              Communication Communication Communication: No apparent difficulties  Cognition Arousal: Alert Behavior During Therapy: WFL for tasks assessed/performed                           PT - Cognition Comments: mor alert, oriented to place and  reason to be in hosp, not specific day.  Following commands: Intact Following commands impaired: Only follows one step commands consistently    Cueing Cueing Techniques: Verbal cues  Exercises Other Exercises Other Exercises: belt provided to perform AA SLR and abd and heel slides    General Comments        Pertinent Vitals/Pain Pain Assessment Pain Score: 10-Worst pain ever Pain Location: legs and back Pain Descriptors / Indicators: Aching, Grimacing, Guarding, Sore Pain Intervention(s): RN gave pain meds  during session, Repositioned, Monitored during session    Home Living                          Prior Function            PT Goals (current goals can now be found in the care plan section) Progress towards PT goals: Progressing toward goals    Frequency    Min 2X/week      PT Plan      Co-evaluation              AM-PAC PT 6 Clicks Mobility   Outcome Measure  Help needed turning from your back to your side while in a flat bed without using bedrails?: A Lot Help needed moving from lying on your back to sitting on the side of a flat bed without using bedrails?: A Lot Help needed moving to and from a bed to a chair (including a wheelchair)?: A Lot Help needed standing up from a chair using your arms (e.g., wheelchair or bedside chair)?: A Lot Help needed to walk in hospital room?: Total Help needed climbing 3-5 steps with a railing? : Total 6 Click Score: 10    End of Session Equipment Utilized During Treatment: Gait belt Activity Tolerance: Patient tolerated treatment well Patient left: in chair;with call bell/phone within reach;with chair alarm set Nurse Communication: Mobility status PT Visit Diagnosis: Unsteadiness on feet (R26.81);Pain Pain - Right/Left: Left Pain - part of body: Leg     Time: 8943-8879 PT Time Calculation (min) (ACUTE ONLY): 24 min  Charges:    $Therapeutic Activity: 23-37 mins PT General Charges $$ ACUTE PT VISIT: 1 Visit                     Darice Potters PT Acute Rehabilitation Services Office [phone]    Potters Darice Norris 02/26/2024, 1:46 PM

## 2024-02-26 NOTE — TOC Progression Note (Signed)
 Transition of Care (TOC) - Progression Note    Patient Details  Name: Charles Lloyd. MRN: [id number] Date of Birth: 07/14/1940  Transition of Care [facility]) CM/SW Contact  Jon ONEIDA Anon, RN Phone Number: 02/26/2024, 2:30 PM  Clinical Narrative:    PT evaluated pt, and recommending CIR. CIR following for potential admission. ICM will continue to follow for DC planning needs.  Expected Discharge Plan: IP Rehab Facility Barriers to Discharge: Continued Medical Work up               Expected Discharge Plan and Services In-house Referral: NA Discharge Planning Services: CM Consult Post Acute Care Choice: Durable Medical Equipment, IP Rehab Living arrangements for the past 2 months: Single Family Home                                       Social Drivers of Health (SDOH) Interventions SDOH Screenings   Food Insecurity: No Food Insecurity (02/23/2024)  Housing: Low Risk (02/23/2024)  Transportation Needs: No Transportation Needs (02/23/2024)  Utilities: Not At Risk (02/23/2024)  Alcohol Screen: Low Risk (12/21/2022)  Depression (PHQ2-9): Low Risk (02/11/2024)  Financial Resource Strain: Low Risk (12/21/2022)  Physical Activity: Inactive (12/21/2022)  Social Connections: Socially Isolated (02/23/2024)  Stress: No Stress Concern Present (12/21/2022)  Tobacco Use: Low Risk (02/22/2024)  Health Literacy: Adequate Health Literacy (12/21/2022)    Readmission Risk Interventions    02/26/2024    2:28 PM 08/08/2021   10:00 AM  Readmission Risk Prevention Plan  Transportation Screening Complete Complete  PCP or Specialist Appt within 3-5 Days Complete   HRI or Home Care Consult Complete   Social Work Consult for Recovery Care Planning/Counseling Complete   Palliative Care Screening Not Applicable   Medication Review Oceanographer) Complete Complete  PCP or Specialist appointment within 3-5 days of discharge  Complete  HRI or Home Care Consult  Complete   SW Recovery Care/Counseling Consult  Not Complete  Palliative Care Screening  Not Applicable  Skilled Nursing Facility  Not Applicable

## 2024-02-26 NOTE — Progress Notes (Signed)
 " Progress Note   Patient: Charles Lloyd. FMW:[id number] DOB: February 12, 1941 DOA: 02/22/2024     4 DOS: the patient was seen and examined on 02/26/2024   Brief hospital course: Charles Lloyd. is an 83 y.o. male with medical history significant for hypertension, hyperlipidemia, type 2 diabetes mellitus, atrial fibrillation on Eliquis , and L4-5 spondylolisthesis status post L4-5 trans psoas lumbar interbody fusion left-sided approach on 02/06/2024, now presenting with ongoing leg weakness, pain, and recurrent falls. He fell in the shower tonight and hit his head.  On presentation, he was hemodynamically stable.Imaging in the ED is most notable for new 3.5 x 2.4 cm fluid collection in the left psoas muscle noted on MRI and concerning for abscess. Neurosurgery (Dr. Colon) was consulted by the ED physician and recommended IV antibiotics and IR consultation for possible aspiration.   S/p IR CT guided aspiration of left psoas fluid collection, continued on board spectrum antibiotics. Hospital stay remarkable for new onset confusion, agitation transferred to ICU for possible alcohol withdrawal. He is off precedex , on phenobarb taper stable for transfer to TRH service 12/31.  Assessment and Plan: Acute metabolic encephalopathy Hospital delirium, alcohol withdrawal with history of dementia.  MRI brain unremarkable. Taper off phenobarbital . He is off precedex  drip.  Continue delirium precaution, frequent reorientation.  CT head negative for acute findings.   Left psoas abscess  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. S/p IR CT guided aspiration of left psoas fluid collection, drain placed.  Continue broad-spectrum antibiotics, zosyn , zyvox .  No growth on fluid and blood cultures.      Weakness and falls  Patient reports multiple falls in last few weeks that he attributes to leg weakness, pain, and tremors.  Fall precautions. PT/ OT follow up   Atrial  fibrillation  Resumed Eliquis  12/31, continue flecainide  and Coreg  as tolerated. Has prolonged QTc.  Monitor on telemetry.     Hypertension  Continue Coreg  and hydralazine .     Type II DM  A1c was 5.9% earlier this month. Hypoglycemia protocol.   Chronic HFpEF  Compensated, no exacerbation.    Asthma  No exacerbation. Continue Singulair  and as-needed albuterol .    Out of bed to chair. Incentive spirometry. Nursing supportive care. Fall, aspiration precautions. Diet:  Diet Orders (From admission, onward)     Start     Ordered   02/25/24 1433  Diet regular Room service appropriate? Yes; Fluid consistency: Thin  Diet effective now       Question Answer Comment  Room service appropriate? Yes   Fluid consistency: Thin      02/25/24 1432           DVT prophylaxis: SCDs Start: 02/23/24 0010  Level of care: Telemetry   Code Status: Full Code  Subjective: Patient is seen and examined today morning. He is sitting in chair, more alert and awake able to answer me appropriately. Eating poor.  Physical Exam: Vitals:   02/26/24 0427 02/26/24 0500 02/26/24 0604 02/26/24 0900  BP:  (!) 150/52 (!) 162/58   Pulse:  76 73   Resp:  15 19   Temp: 97.7 F (36.5 C)   (!) 97.5 F (36.4 C)  TempSrc: Oral   Oral  SpO2:  100% 99%   Weight:      Height:        General - Elderly Caucasian male, no apparent distress HEENT - PERRLA, EOMI, atraumatic head, non tender sinuses. Lung - Clear, basal rales, rhonchi,  wheezes. Heart - S1, S2 heard, no murmurs, rubs, trace pedal edema. Abdomen - Soft, non tender, bowel sounds good Neuro - Alert, awake and oriented x 3, non focal exam. Skin - Warm and dry.  Data Reviewed:      Latest Ref Rng & Units 02/26/2024    5:00 AM 02/25/2024    3:41 AM 02/24/2024    7:32 AM  CBC  WBC 4.0 - 10.5 K/uL 4.0  3.5  5.3   Hemoglobin 13.0 - 17.0 g/dL 8.7  9.2  89.8   Hematocrit 39.0 - 52.0 % 25.9  27.9  30.3   Platelets 150 - 400 K/uL 204  211   267       Latest Ref Rng & Units 02/26/2024    5:00 AM 02/25/2024    3:41 AM 02/24/2024    2:38 AM  BMP  Glucose 70 - 99 mg/dL 87  897  883   BUN 8 - 23 mg/dL 9  10  9    Creatinine 0.61 - 1.24 mg/dL 9.26  9.21  9.23   Sodium 135 - 145 mmol/L 130  129  132   Potassium 3.5 - 5.1 mmol/L 3.5  4.0  3.5   Chloride 98 - 111 mmol/L 99  99  101   CO2 22 - 32 mmol/L 20  20  22    Calcium  8.9 - 10.3 mg/dL 8.3  8.5  8.5    CT GUIDED PERITONEAL/RETROPERITONEAL FLUID DRAIN BY PERC CATH Result Date: 02/24/2024 INDICATION: 83 year old with a postoperative fluid collection in the left psoas muscle. EXAM: CT-guided aspiration of left psoas fluid collection MEDICATIONS: Moderate sedation ANESTHESIA/SEDATION: Moderate (conscious) sedation was employed during this procedure. A total of Versed  1mg  and fentanyl  50 mcg was administered intravenously at the order of the provider performing the procedure. Total intra-service moderate sedation time: 15 minutes. Patient's level of consciousness and vital signs were monitored continuously by radiology nurse throughout the procedure under the supervision of the provider performing the procedure. COMPLICATIONS: None immediate. PROCEDURE: Informed written consent was obtained from the patient after a thorough discussion of the procedural risks, benefits and alternatives. All questions were addressed. A timeout was performed prior to the initiation of the procedure. Patient was placed prone. CT images through the lower abdomen were obtained. Left side of the back was prepped with Betadine and sterile field was created. Skin was anesthetized using 1% lidocaine . Using CT guidance, a Yueh catheter was directed into the left psoas muscle. Initially, the needle was placed at the level of the L4 pedicle screw. However, no fluid could be aspirated. The Yueh catheter was directed more caudal into the anterior aspect of the left psoas muscle and 1 mL of bloody fluid was aspirated. Bandage  placed over the puncture site. RADIATION DOSE REDUCTION: This exam was performed according to the departmental dose-optimization program which includes automated exposure control, adjustment of the mA and/or kV according to patient size and/or use of iterative reconstruction technique. FINDINGS: Bilateral interbody fusion at L4-L5. Needle was directed into the anterior aspect of the left psoas muscle and 1 mL bloody fluid was removed. IMPRESSION: CT-guided aspiration of left psoas fluid collection. Fluid was sent for culture. Electronically Signed   By: Juliene Balder M.D.   On: 02/24/2024 22:02   MR BRAIN WO CONTRAST Result Date: 02/24/2024 EXAM: MRI BRAIN WITHOUT CONTRAST 02/24/2024 04:46:52 PM TECHNIQUE: Multiplanar multisequence MRI of the head/brain was performed without the administration of intravenous contrast. COMPARISON: CT head 02/22/2024 and earlier. CLINICAL  HISTORY: Delirium. FINDINGS: BRAIN AND VENTRICLES: Mild generalized parenchymal volume loss. Mild prominence of extra spaces over the cerebral convexities, likely related to volume loss, without evidence of extra-axial fluid collection. White matter within normal limits for patient's age. No acute infarct. No intracranial hemorrhage. No mass. No midline shift. No hydrocephalus. The sella is unremarkable. Normal flow voids. ORBITS: Left lens replacement. SINUSES AND MASTOIDS: Mucosal thickening in the right maxillary sinus. Trace fluid in the mastoid air cells, right greater than left. BONES AND SOFT TISSUES: Normal marrow signal. No acute soft tissue abnormality. IMPRESSION: 1. No acute intracranial abnormality. Electronically signed by: Donnice Mania MD 02/24/2024 06:25 PM EST RP Workstation: [id number]    Family Communication: Discussed with patient, he understand and agree. All questions answered.  Disposition: Status is: Inpatient Remains inpatient appropriate because: AMS, broad spectrum abx.  Planned Discharge Destination: Home with  Home Health     Time spent: 52 minutes  Author: Concepcion Riser, MD 02/26/2024 12:59 PM Secure chat 7am to 7pm For on call review [email or web address].    "

## 2024-02-26 NOTE — NC FL2 (Signed)
 " [place]  MEDICAID FL2 LEVEL OF CARE FORM     IDENTIFICATION  Patient Name: Charles Lloyd. Birthdate: [birth year] Sex: male Admission Date (Current Location): 02/22/2024  [facility] and [place] Number:  Producer, Television/film/video and Address:  [facility],  [address], [place] [address]      Provider Number: [id number]  Attending Physician Name and Address:  Darci Pore, MD  Relative Name and Phone Number:  Reino, Lybbert  Son, Emergency Contact  [phone]    Current Level of Care: Hospital Recommended Level of Care: Skilled Nursing Facility Prior Approval Number:    Date Approved/Denied:   PASRR Number: [id number]  Discharge Plan: SNF    Current Diagnoses: Patient Active Problem List   Diagnosis Date Noted   Altered mental status 02/24/2024   Alcohol withdrawal syndrome, with delirium (HCC) 02/24/2024   Delirium 02/24/2024   Psoas abscess, left (HCC) 02/22/2024   Elective surgery 02/06/2024   Bilateral leg pain 11/29/2023   Acute posthemorrhagic anemia 06/21/2022   Bilateral lower extremity edema 04/06/2022   Coronary artery disease involving native coronary artery of native heart without angina pectoris 04/05/2022   Gout 02/27/2022   Acute renal failure superimposed on stage 3b chronic kidney disease (HCC) 11/16/2021   Uremia 11/16/2021   Gout due to renal impairment 10/17/2021   Pancytopenia (HCC) 08/05/2021   SIRS (systemic inflammatory response syndrome) (HCC) 08/04/2021   Chronic kidney disease, stage 3b (HCC) 12/30/2020   Multiple falls    Spinal stenosis    S/P TKR (total knee replacement), bilateral    Arthralgia 11/20/2019   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   Lesion of liver 04/20/2019   Secondary hypercoagulable state 04/09/2019   Eosinophil count raised 02/17/2019   Acute tracheobronchitis 01/05/2019   Asthma    Vitamin D  deficiency 07/25/2018   Chronic anticoagulation 03/06/2018   Hyperlipidemia associated with  type 2 diabetes mellitus (HCC) 01/28/2018   Basal cell carcinoma (BCC) of left side of nose 01/28/2018   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   Artificial knee joint present 05/20/2017   Left knee pain 05/07/2017   B12 deficiency 10/22/2016   Overweight (BMI 25.0-29.9) 10/22/2016   Primary osteoarthritis of right knee 03/29/2015   Osteoarthritis of right knee 03/29/2015   History of cholecystectomy 11/20/2014   Occult blood in stools 02/09/2014   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   Paroxysmal A-fib (HCC) 04/16/2013   Chronic heart failure with preserved ejection fraction (HFpEF) (HCC) 04/16/2013   Colon polyps 04/16/2013   NIDDM-2 with polyneuropathy and hyperglycemia 04/16/2013   Tinea cruris 04/16/2013   Diabetic neuropathy (HCC) 07/14/2012   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) 07/14/2012   Insomnia 03/04/2012   History of cardiovascular disorder 08/16/2011   Hypertensive heart disease with heart failure (HCC) 01/06/2010   Neoplasm of prostate, malignant (HCC) 01/06/2010   Hypercholesterolemia 01/06/2010   Essential hypertension 01/06/2010    Orientation RESPIRATION BLADDER Height & Weight     Self, Time, Place  O2 (3L O2 [place]) Incontinent Weight: 79.3 kg Height:  5' 8 (172.7 cm)  BEHAVIORAL SYMPTOMS/MOOD NEUROLOGICAL BOWEL NUTRITION STATUS      Continent Diet (Regular, Thin Liquids)  AMBULATORY STATUS COMMUNICATION OF NEEDS Skin   Extensive Assist Verbally Other (Comment), Surgical wounds (Pressure Injury Buttocks Mid;Lower Deep Tissue Pressure Injury)                       Personal Care Assistance Level of Assistance  Bathing, Feeding, Dressing Bathing Assistance: Limited assistance Feeding assistance: Limited assistance Dressing Assistance: Limited assistance     Functional Limitations Info  Sight, Hearing, Speech Sight Info: Adequate Hearing Info: Impaired Speech Info: Adequate    SPECIAL CARE FACTORS FREQUENCY  PT (By  licensed PT), OT (By licensed OT)     PT Frequency: 5x/wk OT Frequency: 5x/wk            Contractures Contractures Info: Not present    Additional Factors Info  Code Status, Allergies Code Status Info: FULL Allergies Info: Amlodipine , Ativan  (Lorazepam ), Chlorhexidine  Gluconate           Current Medications (02/26/2024):  This is the current hospital active medication list Current Facility-Administered Medications  Medication Dose Route Frequency Provider Last Rate Last Admin   acetaminophen  (TYLENOL ) tablet 650 mg  650 mg Oral Q6H PRN Opyd, Timothy S, MD   650 mg at 02/26/24 1059   Or   acetaminophen  (TYLENOL ) suppository 650 mg  650 mg Rectal Q6H PRN Opyd, Timothy S, MD       atorvastatin  (LIPITOR) tablet 20 mg  20 mg Oral QHS Crozier, Peyton, PA-C   20 mg at 02/25/24 2228   bisacodyl  (DULCOLAX) EC tablet 5 mg  5 mg Oral Daily PRN Opyd, Timothy S, MD       carvedilol  (COREG ) tablet 3.125 mg  3.125 mg Oral BID Crozier, Peyton, PA-C   3.125 mg at 02/26/24 9167   cyclobenzaprine  (FLEXERIL ) tablet 10 mg  10 mg Oral TID PRN Crozier, Peyton, PA-C   10 mg at 02/26/24 1059   cycloSPORINE  (RESTASIS ) 0.05 % ophthalmic emulsion 1 drop  1 drop Both Eyes Daily Opyd, Timothy S, MD   1 drop at 02/26/24 9171   flecainide  (TAMBOCOR ) tablet 100 mg  100 mg Oral BID Crozier, Peyton, PA-C   100 mg at 02/26/24 9167   folic acid  (FOLVITE ) tablet 1 mg  1 mg Oral Daily Crozier, Peyton, PA-C   1 mg at 02/26/24 9167   gabapentin  (NEURONTIN ) capsule 300 mg  300 mg Oral Daily Crozier, Peyton, PA-C   300 mg at 02/26/24 9167   gabapentin  (NEURONTIN ) capsule 600 mg  600 mg Oral QPM Crozier, Peyton, PA-C   600 mg at 02/25/24 1705   hydrALAZINE  (APRESOLINE ) tablet 25 mg  25 mg Oral BID Crozier, Peyton, PA-C   25 mg at 02/26/24 0831   hydrocortisone  cream 1 %   Topical TID Jillian Buttery, MD   Given at 02/24/24 2129   insulin  aspart (novoLOG ) injection 0-6 Units  0-6 Units Subcutaneous Q4H Opyd, Timothy S, MD    1 Units at 02/24/24 2331   linezolid  (ZYVOX ) IVPB 600 mg  600 mg Intravenous Q12H Chavez, Abigail, NP   Stopped at 02/26/24 1035   montelukast  (SINGULAIR ) tablet 10 mg  10 mg Oral QHS Crozier, Peyton, PA-C   10 mg at 02/25/24 2228   Oral care mouth rinse  15 mL Mouth Rinse PRN Jillian Buttery, MD       Oral care mouth rinse  15 mL Mouth Rinse 4 times per day Kara Dorn NOVAK, MD   15 mL at 02/26/24 9170   Oral care mouth rinse  15 mL Mouth Rinse PRN Kara Dorn NOVAK, MD       PHENobarbital  (LUMINAL) tablet 32.4 mg  32.4 mg Oral Q8H Sreeram, Narendranath, MD       piperacillin -tazobactam (ZOSYN ) IVPB 3.375 g  3.375 g Intravenous Q8H Opyd, Timothy S, MD   Stopped at  02/26/24 0829   senna-docusate (Senokot-S) tablet 1 tablet  1 tablet Oral QHS PRN Opyd, Timothy S, MD       sodium chloride  flush (NS) 0.9 % injection 10-40 mL  10-40 mL Intracatheter PRN Opyd, Timothy S, MD       sodium chloride  flush (NS) 0.9 % injection 3 mL  3 mL Intravenous Q12H Opyd, Timothy S, MD   3 mL at 02/26/24 0830   thiamine  (VITAMIN B1) tablet 100 mg  100 mg Oral Daily Adhikari, Ivonne, MD   100 mg at 02/26/24 0830   Or   thiamine  (VITAMIN B1) injection 100 mg  100 mg Intravenous Daily Jillian Ivonne, MD   100 mg at 02/25/24 9064     Discharge Medications: Please see discharge summary for a list of discharge medications.  Relevant Imaging Results:  Relevant Lab Results:   Additional Information SSN: [id number]  Jon ONEIDA Anon, RN     "

## 2024-02-26 NOTE — Progress Notes (Signed)

## 2024-02-27 DIAGNOSIS — L8990 Pressure ulcer of unspecified site, unspecified stage: Secondary | ICD-10-CM | POA: Diagnosis present

## 2024-02-27 DIAGNOSIS — K6812 Psoas muscle abscess: Secondary | ICD-10-CM | POA: Diagnosis not present

## 2024-02-27 LAB — GLUCOSE, CAPILLARY
Glucose-Capillary: 111 mg/dL — ABNORMAL HIGH (ref 70–99)
Glucose-Capillary: 233 mg/dL — ABNORMAL HIGH (ref 70–99)
Glucose-Capillary: 77 mg/dL (ref 70–99)
Glucose-Capillary: 79 mg/dL (ref 70–99)
Glucose-Capillary: 87 mg/dL (ref 70–99)
Glucose-Capillary: 92 mg/dL (ref 70–99)

## 2024-02-27 LAB — BASIC METABOLIC PANEL WITH GFR
Anion gap: 9 (ref 5–15)
BUN: 8 mg/dL (ref 8–23)
CO2: 22 mmol/L (ref 22–32)
Calcium: 8.6 mg/dL — ABNORMAL LOW (ref 8.9–10.3)
Chloride: 99 mmol/L (ref 98–111)
Creatinine, Ser: 0.73 mg/dL (ref 0.61–1.24)
GFR, Estimated: >60 mL/min
Glucose, Bld: 108 mg/dL — ABNORMAL HIGH (ref 70–99)
Potassium: 3.6 mmol/L (ref 3.5–5.1)
Sodium: 129 mmol/L — ABNORMAL LOW (ref 135–145)

## 2024-02-27 LAB — CBC
HCT: 26.2 % — ABNORMAL LOW (ref 39.0–52.0)
Hemoglobin: 8.9 g/dL — ABNORMAL LOW (ref 13.0–17.0)
MCH: 29.6 pg (ref 26.0–34.0)
MCHC: 34 g/dL (ref 30.0–36.0)
MCV: 87 fL (ref 80.0–100.0)
Platelets: 204 K/uL (ref 150–400)
RBC: 3.01 MIL/uL — ABNORMAL LOW (ref 4.22–5.81)
RDW: 16.3 % — ABNORMAL HIGH (ref 11.5–15.5)
WBC: 3.5 K/uL — ABNORMAL LOW (ref 4.0–10.5)
nRBC: 0 % (ref 0.0–0.2)

## 2024-02-27 NOTE — Assessment & Plan Note (Addendum)
 Hospital delirium, alcohol withdrawal with history of dementia MRI brain unremarkable Off precedex  drip Taper off phenobarbital , will complete on 1/2 Continue delirium precaution, frequent reorientation CT head negative for acute findings This issue appears to have resolved and his son is concerned that it was a paradoxical response to Ativan ; added as allergy

## 2024-02-27 NOTE — Assessment & Plan Note (Addendum)
 Resumed Eliquis  12/31 Continue flecainide  and Coreg  for rate/rhythm control Has prolonged QTc Monitor on telemetry

## 2024-02-27 NOTE — Assessment & Plan Note (Addendum)
 Patient reports multiple falls in last few weeks that he attributes to leg weakness, pain, and tremors Fall precautions PT/ OT consulted He is being considered for CIR vs. STR at [facility]

## 2024-02-27 NOTE — Assessment & Plan Note (Signed)
 Wound 02/24/24 1245 Pressure Injury Buttocks Mid;Lower Deep Tissue Pressure Injury - Purple or maroon localized area of discolored intact skin or blood-filled blister due to damage of underlying soft tissue from pressure and/or shear. (Active)     Wound 02/24/24 1245 Pressure Injury Scrotum Circumferential Deep Tissue Pressure Injury - Purple or maroon localized area of discolored intact skin or blood-filled blister due to damage of underlying soft tissue from pressure and/or shear. (Active)  POA

## 2024-02-27 NOTE — Hospital Course (Addendum)
 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  He was found to have a 3.5 x 2.4 cm L psoas muscle abscess.  Neurosurgery recommended IR drainage and IV antibiotics.  CT-guided aspiration on 12/29.  Developed hospital-associated delirium requiring ICU transfer, now improved.  Will need STR, medically stable when bed is available.

## 2024-02-27 NOTE — Assessment & Plan Note (Signed)
"  Continue atorvastatin   "

## 2024-02-27 NOTE — Assessment & Plan Note (Addendum)
 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 Presented with LLE weakness, pain, and recurrent falls Imaging with L psoas muscle abscess S/p IR CT guided aspiration of left psoas fluid collection, drain placed Continue broad-spectrum antibiotics - Zosyn , linezolid  No growth on fluid and blood cultures He continues to have significant LLE pain with standing Pain control with APAP, gabapentin 

## 2024-02-27 NOTE — Assessment & Plan Note (Addendum)
-  Continue Coreg and hydralazine °

## 2024-02-27 NOTE — Evaluation (Signed)
 Occupational Therapy Evaluation Patient Details Name: Charles Lloyd. MRN: [id number] DOB: 06/06/1940 Today's Date: 02/27/2024   History of Present Illness   Walt Geathers. is a 84 yr old male who presented to the emergency department on 02/22/2024 for leg pain and weakness, with family reporting multiple falls over the past 6 to 8 months.  MRI lumbar spine revealed a new 3.5 x 2.4 cm fluid collection in the left psoas muscle concerning for abscess.  Neurosurgery was consulted at that time and recommended IV antibiotics and possible aspiration of abscess. PMH: hypertension, hyperlipidemia, type 2 diabetes, atrial fibrillation on anticoagulation, s/p recent L4-5 trans psoas lumbar interbody fusion left-sided approach on 02/06/2024, CHF,gout,CAD, TKA with infection, neuropathy     Clinical Impressions The pt is currently presenting well below his baseline level of functioning for self care management. He is limited by the below listed deficits. See OT problem list. As such, his ADL performance is compromised and he is requiring increased assist for the management of ADLs. During the session today, he required mod assist x2 to stand using a RW, mod assist x2 to step-pivot to & from the bedside commode using a RW, and max assist for toileting management at bedside commode level. He reported feelings of significant BLE weakness, as well as BLE pain, described as tingling, occasionally sharp, and like vibrations. He stated the pain in his BLE and back started a couple months ago. He desires to regain his functional independence and to be able to return to his home. He will benefit from OT services to maximize his independence with ADLs & to decrease the risk for restricted participation in other meaningful activities. Patient will benefit from intensive inpatient follow-up therapy, >3 hours/day.     If plan is discharge home, recommend the following:   A lot of help with walking and/or  transfers;A lot of help with bathing/dressing/bathroom;Help with stairs or ramp for entrance;Assistance with cooking/housework;Assist for transportation;Direct supervision/assist for medications management     Functional Status Assessment   Patient has had a recent decline in their functional status and demonstrates the ability to make significant improvements in function in a reasonable and predictable amount of time.     Equipment Recommendations   Other (comment) (defer to next setting)     Recommendations for Other Services         Precautions/Restrictions   Precautions Precautions: Fall Restrictions Weight Bearing Restrictions Per Provider Order: No     Mobility Bed Mobility      General bed mobility comments: pt was received seated in the chair    Transfers Overall transfer level: Needs assistance Equipment used: Rolling walker (2 wheels) Transfers: Sit to/from Stand, Bed to chair/wheelchair/BSC Sit to Stand: Mod assist, +2 physical assistance, +2 safety/equipment Stand pivot transfers: Mod assist, +2 physical assistance, +2 safety/equipment                Balance     Sitting balance-Leahy Scale: Fair       Standing balance-Leahy Scale: Poor           ADL either performed or assessed with clinical judgement   ADL Overall ADL's : Needs assistance/impaired Eating/Feeding: Independent;Sitting   Grooming: Set up;Sitting   Upper Body Bathing: Set up;Supervision/ safety;Sitting   Lower Body Bathing: Moderate assistance;Sitting/lateral leans   Upper Body Dressing : Set up;Supervision/safety;Sitting   Lower Body Dressing: Maximal assistance;Sitting/lateral leans;Cueing for compensatory techniques   Toilet Transfer: Moderate assistance;+2 for physical assistance;Stand-pivot;BSC/3in1;Rolling walker (2 wheels)  Toilet Transfer Details (indicate cue type and reason): Pt was instructed on stand-pivot transfer onto off the bedside commode. He  required assist for balance/steadying and increased cues for body positioning, walker placement and advancement, to correct forward flexed posture, and to reach back prior to sitting down. Toileting- Clothing Manipulation and Hygiene: Moderate assistance;+2 for safety/equipment;Cueing for sequencing;Maximal assistance;Sit to/from stand Toileting - Clothing Manipulation Details (indicate cue type and reason): Toieting management performed with the pt at bedside commode level. He required max assist for clothing management, mod assist for standing balance and total assist for posterior peri-hygiene after a bowel movement.             Vision   Additional Comments: He correctly read the time depicted on the wall clock.            Pertinent Vitals/Pain Pain Assessment Pain Assessment: 0-10 Pain Score: 7  Pain Location: distal BLE and low back Pain Descriptors / Indicators: Tingling, Sharp, Other (Comment) (vibrating) Pain Intervention(s): Limited activity within patient's tolerance, Monitored during session, Repositioned     Extremity/Trunk Assessment Upper Extremity Assessment Upper Extremity Assessment: Right hand dominant;LUE deficits/detail;RUE deficits/detail RUE Deficits / Details: AROM WFL. Gross strength 4/5 LUE Deficits / Details: AROM WFL. Gross strength 4/5   Lower Extremity Assessment Lower Extremity Assessment: Generalized weakness;RLE deficits/detail RLE Deficits / Details: He reported increased pain which has been ongoing for ~8 wks. He also reported tingling, vibrating sensation, and intermittent sharp pain. LLE Deficits / Details: He reported increased pain which has been ongoing for ~8 wks. He also reported tingling, vibrating sensation, and intermittent sharp pain.       Communication Communication Communication: No apparent difficulties Factors Affecting Communication: Difficulty expressing self   Cognition Arousal: Alert Behavior During Therapy: WFL for  tasks assessed/performed         Memory impairment (select all impairments): Short-term memory, Working memory Attention impairment (select first level of impairment): Divided attention Executive functioning impairment (select all impairments): Problem solving OT - Cognition Comments: Oriented to person, place and month. Required increased time to recall the year. Partly oriented to situation                 Following commands: Intact Following commands impaired: Only follows one step commands consistently     Cueing  General Comments   Cueing Techniques: Verbal cues              Home Living Family/patient expects to be discharged to:: Private residence Living Arrangements: Alone Available Help at Discharge: Family;Available PRN/intermittently Type of Home: House (townhouse) Home Access: Stairs to enter [employer] of Steps: 1   Home Layout: Two level;Bed/bath upstairs;1/2 bath on main level Alternate Level Stairs-Number of Steps: stair lift, all bedrooms and the full bathroom are upstairs   Bathroom Shower/Tub: Producer, Television/film/video: Handicapped height     Home Equipment: Rollator (4 wheels);Cane - single point;Shower seat   Additional Comments: Pt had occasional difficulty with providing detailed reports, therefore info reported here may need to be verified.      Prior Functioning/Environment        Mobility Comments:  (He used a rollator for ambulation.) ADLs Comments:  (Pt reported being modified independent with ADLs. He was able to drive and his son assisted with household chores.)    OT Problem List: Decreased strength;Decreased activity tolerance;Impaired balance (sitting and/or standing);Decreased knowledge of use of DME or AE;Decreased knowledge of precautions;Impaired sensation;Pain   OT Treatment/Interventions: Self-care/ADL training;Therapeutic exercise;Energy  conservation;DME and/or AE instruction;Therapeutic  activities;Balance training;Patient/family education      OT Goals(Current goals can be found in the care plan section)   Acute Rehab OT Goals Patient Stated Goal: to regain functional independence OT Goal Formulation: With patient Time For Goal Achievement: 03/12/24 Potential to Achieve Goals: Good ADL Goals Pt Will Perform Lower Body Dressing: with contact guard assist;sit to/from stand;sitting/lateral leans;with adaptive equipment Pt Will Transfer to Toilet: with contact guard assist;ambulating Pt Will Perform Toileting - Clothing Manipulation and hygiene: with contact guard assist;sit to/from stand Additional ADL Goal #1: The pt will perform bed mobility with CGA, in prep for progressive ADL participation.   OT Frequency:  Min 2X/week       AM-PAC OT 6 Clicks Daily Activity     Outcome Measure Help from another person eating meals?: None Help from another person taking care of personal grooming?: A Little Help from another person toileting, which includes using toliet, bedpan, or urinal?: A Lot Help from another person bathing (including washing, rinsing, drying)?: A Lot Help from another person to put on and taking off regular upper body clothing?: A Little Help from another person to put on and taking off regular lower body clothing?: A Lot 6 Click Score: 16   End of Session Equipment Utilized During Treatment: Gait belt;Rolling walker (2 wheels) Nurse Communication: Mobility status  Activity Tolerance: Patient tolerated treatment well Patient left: in chair;with call bell/phone within reach;with chair alarm set  OT Visit Diagnosis: Unsteadiness on feet (R26.81);Other abnormalities of gait and mobility (R26.89);Pain;Muscle weakness (generalized) (M62.81) Pain - part of body:  (back and BLE)                Time: 8492-8457 OT Time Calculation (min): 35 min Charges:  OT General Charges $OT Visit: 1 Visit OT Evaluation $OT Eval Moderate Complexity: 1 Mod OT  Treatments $Self Care/Home Management : 8-22 mins   Shyane Fossum' Bram Hottel Harris, OTR/L 02/27/2024, 4:51 PM

## 2024-02-27 NOTE — Assessment & Plan Note (Addendum)
 Compensated, no exacerbation Hold torsemide 

## 2024-02-27 NOTE — Assessment & Plan Note (Addendum)
 No exacerbation Continue Singulair  and as-needed albuterol 

## 2024-02-27 NOTE — Progress Notes (Signed)
 " Progress Note   Patient: Charles Lloyd. 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  He was found to have a 3.5 x 2.4 cm L psoas muscle abscess.  Neurosurgery recommended IR drainage and IV antibiotics.  CT-guided aspiration on 12/29.  Developed hospital-associated delirium requiring ICU transfer, now improved.  Possible dc to CIR.    Assessment & Plan Psoas abscess, left (HCC) 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 Presented with LLE weakness, pain, and recurrent falls Imaging with L psoas muscle abscess S/p IR CT guided aspiration of left psoas fluid collection, drain placed Continue broad-spectrum antibiotics - Zosyn , linezolid  No growth on fluid and blood cultures He continues to have significant LLE pain with standing Pain control with APAP, gabapentin  Delirium Alcohol withdrawal syndrome, with delirium [facility] Hospital delirium, alcohol withdrawal with history of dementia MRI brain unremarkable Off precedex  drip Taper off phenobarbital , will complete on 1/2 Continue delirium precaution, frequent reorientation CT head negative for acute findings This issue appears to have resolved and his son is concerned that it was a paradoxical response to Ativan ; added as allergy Paroxysmal A-fib (HCC) Resumed Eliquis  12/31 Continue flecainide  and Coreg  for rate/rhythm control Has prolonged QTc Monitor on telemetry Chronic heart failure with preserved ejection fraction (HFpEF) (HCC) Compensated, no exacerbation Hold torsemide  NIDDM-2 with polyneuropathy and hyperglycemia A1c was 5.9% earlier this month, good control Cover with very sensitive-scale SSI Hypercholesterolemia Continue atorvastatin  Asthma No  exacerbation Continue Singulair  and as-needed albuterol  Essential hypertension Continue Coreg  and hydralazine  Recurrent falls Patient reports multiple falls in last few weeks that he attributes to leg weakness, pain, and tremors Fall precautions PT/ OT consulted He is being considered for CIR vs. STR at [facility] Pressure injury Wound 02/24/24 1245 Pressure Injury Buttocks Mid;Lower Deep Tissue Pressure Injury - Purple or maroon localized area of discolored intact skin or blood-filled blister due to damage of underlying soft tissue from pressure and/or shear. (Active)     Wound 02/24/24 1245 Pressure Injury Scrotum Circumferential Deep Tissue Pressure Injury - Purple or maroon localized area of discolored intact skin or blood-filled blister due to damage of underlying soft tissue from pressure and/or shear. (Active)  POA        Consultants: Neurosurgery IR PCCM PT OT ICM team  Procedures: CT-guided aspiration of L psoas fluid collection 12/29  Antibiotics: Linezolid  12/28- Zosyn  12/27- Vancomycin  x 1  30 Day Unplanned Readmission Risk Score    Flowsheet Row ED to Hosp-Admission (Current) from 02/22/2024 in [facility]-ICU/STEPDOWN  30 Day Unplanned Readmission Risk Score (%) 27.71 Filed at 02/27/2024 0400    This score is the patient's risk of an unplanned readmission within 30 days of being discharged (0 -100%). The score is based on dignosis, age, lab data, medications, orders, and past utilization.   Low:  0-14.9   Medium: 15-21.9   High: 22-29.9   Extreme: 30 and above           Subjective: Continues to have LLE pain with standing.  Comfortable with sitting/lying down.  No longer confused.   Objective: Vitals:   02/27/24 0900 02/27/24 1213  BP: (!) 167/62   Pulse:    Resp: 15   Temp:  97.6 F (36.4 C)  SpO2:      Intake/Output Summary (Last 24 hours) at 02/27/2024 1403 Last data filed at 02/27/2024 1225 Gross per 24 hour  Intake  1238.73 ml  Output 850 ml  Net 388.73 ml   Filed Weights   02/22/24 1238 02/24/24 1245  Weight: 79.4 kg 79.3 kg    Exam:  General:  Appears calm and comfortable and is in NAD, in chair at bedside Eyes:  normal lids, iris ENT:  grossly normal hearing, lips & tongue, mmm Cardiovascular:  RRR. No LE edema.  Respiratory:   CTA bilaterally with no wheezes/rales/rhonchi.  Normal respiratory effort. Abdomen:  soft, NT, ND Skin:  no rash or induration seen on limited exam Musculoskeletal:  grossly normal tone BUE/BLE, good ROM, no bony abnormality Psychiatric:  grossly normal mood and affect, speech fluent and appropriate, AOx3 Neurologic:  CN 2-12 grossly intact, moves all extremities in coordinated fashion  Data Reviewed: I have reviewed the patient's lab results since admission.  Pertinent labs for today include:   Na++ 129 Glucose 108 WBC 3.5 Hgb 8.9 Blood cultures NTD Wound culture NTD    Family Communication: Son was present throughout evaluation  Mobility: PT/OT Consulted and are recommending - Acute Inpatient Rehab (3hours/Day)02/26/2024 1700    Code Status: Full Code    Disposition: Status is: Inpatient Remains inpatient appropriate because: awaiting placement     Time spent: 50 minutes  Unresulted Labs (From admission, onward)     Start     Ordered   02/27/24 0500  CBC  Daily,   R     Question:  Specimen collection method  Answer:  Unit=Unit collect   02/26/24 2203   02/27/24 0500  Basic metabolic panel with GFR  Daily,   R     Question:  Specimen collection method  Answer:  Unit=Unit collect   02/26/24 2203   02/24/24 1255  Body fluid culture w Gram Stain  Once,   R       Comments: Culture from left psoas abscess per IR   Question Answer Comment  Are there also cytology or pathology orders on this specimen? No   Patient immune status Normal      02/24/24 1256             Author: Delon Herald, MD 02/27/2024 2:03 PM  For on call review  [email or web address].            "

## 2024-02-27 NOTE — Assessment & Plan Note (Addendum)
 A1c was 5.9% earlier this month, good control Cover with very sensitive-scale SSI

## 2024-02-28 ENCOUNTER — Other Ambulatory Visit: Payer: Self-pay

## 2024-02-28 DIAGNOSIS — K6812 Psoas muscle abscess: Secondary | ICD-10-CM | POA: Diagnosis not present

## 2024-02-28 LAB — CULTURE, BLOOD (ROUTINE X 2)
Culture: NO GROWTH
Culture: NO GROWTH
Special Requests: ADEQUATE
Special Requests: ADEQUATE

## 2024-02-28 LAB — CBC
HCT: 27.8 % — ABNORMAL LOW (ref 39.0–52.0)
Hemoglobin: 9.3 g/dL — ABNORMAL LOW (ref 13.0–17.0)
MCH: 29.1 pg (ref 26.0–34.0)
MCHC: 33.5 g/dL (ref 30.0–36.0)
MCV: 86.9 fL (ref 80.0–100.0)
Platelets: 197 K/uL (ref 150–400)
RBC: 3.2 MIL/uL — ABNORMAL LOW (ref 4.22–5.81)
RDW: 16.7 % — ABNORMAL HIGH (ref 11.5–15.5)
WBC: 3.6 K/uL — ABNORMAL LOW (ref 4.0–10.5)
nRBC: 0 % (ref 0.0–0.2)

## 2024-02-28 LAB — BASIC METABOLIC PANEL WITH GFR
Anion gap: 9 (ref 5–15)
BUN: 6 mg/dL — ABNORMAL LOW (ref 8–23)
CO2: 20 mmol/L — ABNORMAL LOW (ref 22–32)
Calcium: 8.4 mg/dL — ABNORMAL LOW (ref 8.9–10.3)
Chloride: 98 mmol/L (ref 98–111)
Creatinine, Ser: 0.65 mg/dL (ref 0.61–1.24)
GFR, Estimated: >60 mL/min
Glucose, Bld: 105 mg/dL — ABNORMAL HIGH (ref 70–99)
Potassium: 3.6 mmol/L (ref 3.5–5.1)
Sodium: 127 mmol/L — ABNORMAL LOW (ref 135–145)

## 2024-02-28 LAB — GLUCOSE, CAPILLARY
Glucose-Capillary: 94 mg/dL (ref 70–99)
Glucose-Capillary: 81 mg/dL (ref 70–99)
Glucose-Capillary: 118 mg/dL — ABNORMAL HIGH (ref 70–99)
Glucose-Capillary: 147 mg/dL — ABNORMAL HIGH (ref 70–99)
Glucose-Capillary: 144 mg/dL — ABNORMAL HIGH (ref 70–99)

## 2024-02-28 MED ORDER — ORAL CARE MOUTH RINSE: 15.0000 mL | OROMUCOSAL | Status: DC | PRN

## 2024-02-28 MED ORDER — SODIUM CHLORIDE 0.9 % IV SOLN
2.0000 g | Freq: Three times a day (TID) | INTRAVENOUS | Status: DC
  Administered 2024-02-28 – 2024-03-07 (×23): 2 g via INTRAVENOUS
  Filled 2024-02-28 (×23): qty 12.5

## 2024-02-28 MED ORDER — CEFEPIME IV (FOR PTA / DISCHARGE USE ONLY): 2.0000 g | Freq: Three times a day (TID) | INTRAVENOUS | 0 refills | Status: DC

## 2024-02-28 MED ORDER — DAPTOMYCIN IV (FOR PTA / DISCHARGE USE ONLY): 600.0000 mg | INTRAVENOUS | 0 refills | Status: DC

## 2024-02-28 MED ORDER — LACTATED RINGERS IV BOLUS
500.0000 mL | Freq: Once | INTRAVENOUS | Status: AC
  Administered 2024-02-28: 500 mL via INTRAVENOUS

## 2024-02-28 MED ORDER — SODIUM CHLORIDE 0.9 % IV SOLN
8.0000 mg/kg | Freq: Every day | INTRAVENOUS | Status: DC
  Administered 2024-02-28 – 2024-03-08 (×9): 600 mg via INTRAVENOUS
  Filled 2024-02-28 (×11): qty 12

## 2024-02-28 NOTE — Plan of Care (Signed)
" °  Problem: Education: Goal: Ability to describe self-care measures that may prevent or decrease complications (Diabetes Survival Skills Education) will improve Outcome: Progressing   Problem: Coping: Goal: Ability to adjust to condition or change in health will improve Outcome: Progressing   Problem: Metabolic: Goal: Ability to maintain appropriate glucose levels will improve Outcome: Progressing   Problem: Nutritional: Goal: Maintenance of adequate nutrition will improve Outcome: Progressing Goal: Progress toward achieving an optimal weight will improve Outcome: Progressing   Problem: Skin Integrity: Goal: Risk for impaired skin integrity will decrease Outcome: Progressing   Problem: Tissue Perfusion: Goal: Adequacy of tissue perfusion will improve Outcome: Progressing   Problem: Education: Goal: Knowledge of General Education information will improve Description: Including pain rating scale, medication(s)/side effects and non-pharmacologic comfort measures Outcome: Progressing   Problem: Health Behavior/Discharge Planning: Goal: Ability to manage health-related needs will improve Outcome: Progressing   Problem: Clinical Measurements: Goal: Ability to maintain clinical measurements within normal limits will improve Outcome: Progressing Goal: Will remain free from infection Outcome: Progressing Goal: Diagnostic test results will improve Outcome: Progressing Goal: Respiratory complications will improve Outcome: Progressing Goal: Cardiovascular complication will be avoided Outcome: Progressing   Problem: Activity: Goal: Risk for activity intolerance will decrease Outcome: Progressing   Problem: Nutrition: Goal: Adequate nutrition will be maintained Outcome: Progressing   Problem: Coping: Goal: Level of anxiety will decrease Outcome: Progressing   Problem: Elimination: Goal: Will not experience complications related to bowel motility Outcome:  Progressing Goal: Will not experience complications related to urinary retention Outcome: Progressing   Problem: Pain Managment: Goal: General experience of comfort will improve and/or be controlled Outcome: Progressing   Problem: Safety: Goal: Ability to remain free from injury will improve Outcome: Progressing   Problem: Skin Integrity: Goal: Risk for impaired skin integrity will decrease Outcome: Progressing   Cindy S. Loreli BSN, RN, CCRP, CCRN 02/28/2024 2:19 AM "

## 2024-02-28 NOTE — Assessment & Plan Note (Addendum)
 No exacerbation Continue Singulair  and as-needed albuterol 

## 2024-02-28 NOTE — Assessment & Plan Note (Addendum)
 A1c was 5.9% earlier this month, good control Cover with very sensitive-scale SSI

## 2024-02-28 NOTE — Progress Notes (Signed)
 " Progress Note   Patient: Charles Lloyd. 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  He was found to have a 3.5 x 2.4 cm L psoas muscle abscess.  Neurosurgery recommended IR drainage and IV antibiotics.  CT-guided aspiration on 12/29.  Developed hospital-associated delirium requiring ICU transfer, now improved.  Possible dc to CIR.    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 He is recommended to change antibiotics to Dapto/Cefepime  and OPAT has been arranged Treatment for 6 weeks through 04/13/24 via PICC line Delirium Alcohol withdrawal syndrome, with delirium [facility] Hospital delirium, alcohol withdrawal with history of dementia MRI brain unremarkable Off precedex  drip Taper off phenobarbital , will complete on 1/2 Continue delirium precaution, frequent reorientation CT head negative for acute findings This issue appears to have resolved and his son is concerned that it was a paradoxical response to Ativan ; added as allergy Paroxysmal A-fib (HCC) Resumed Eliquis  12/31 Continue flecainide  and Coreg  for rate/rhythm control Has prolonged QTc Monitor on telemetry Chronic heart failure with preserved ejection  fraction (HFpEF) (HCC) Compensated, no exacerbation Hold torsemide  NIDDM-2 with polyneuropathy and hyperglycemia A1c was 5.9% earlier this month, good control Cover with very sensitive-scale SSI Hypercholesterolemia Continue atorvastatin  Asthma No exacerbation Continue Singulair  and as-needed albuterol  Essential hypertension Continue Coreg  and hydralazine  Recurrent falls Patient reports multiple falls in last few weeks that he attributes to leg weakness, pain, and tremors Fall precautions PT/ OT consulted He is being considered for CIR vs. STR at [facility] Based on lack of 24/7 care at home, he is being recommended for STR  Will reach out to [facility] to check on status Pressure injury Wound 02/24/24 1245 Pressure Injury Buttocks Mid;Lower Deep Tissue Pressure Injury - Purple or maroon localized area of discolored intact skin or blood-filled blister due to damage of underlying soft tissue from pressure and/or shear. (Active)     Wound 02/24/24 1245 Pressure Injury Scrotum Circumferential Deep Tissue Pressure Injury - Purple or maroon localized area of discolored intact skin or blood-filled blister due to damage of underlying soft tissue from pressure and/or shear. (Active)  POA      Consultants: Neurosurgery IR PCCM PT OT ICM team   Procedures: CT-guided aspiration of L psoas fluid collection 12/29   Antibiotics: Linezolid  12/28- Zosyn  12/27- Vancomycin  x 1  30 Day Unplanned Readmission Risk Score    Flowsheet Row ED to Hosp-Admission (Current) from 02/22/2024 in [facility]-ICU/STEPDOWN  30 Day Unplanned Readmission Risk Score (%) 28.06 Filed at 02/28/2024 0400    This score is the patient's risk of an unplanned readmission within 30 days of being discharged (0 -100%). The score is based on dignosis, age, lab data, medications, orders, and past utilization.   Low:  0-14.9   Medium: 15-21.9   High:  22-29.9   Extreme: 30 and above            Subjective: Feeling ok, pain is improving, trying to get more mobile.   Objective: Vitals:   02/28/24 1300 02/28/24 1409  BP:  125/74  Pulse:  67  Resp:  16  Temp: (!) 97.4 F (36.3 C) 98.8 F (37.1 C)  SpO2: 100% 97%    Intake/Output Summary (Last 24 hours) at 02/28/2024 1724 Last data filed at 02/28/2024 1259 Gross per 24 hour  Intake 1701.1 ml  Output 875 ml  Net 826.1 ml   Filed Weights   02/22/24 1238 02/24/24 1245  Weight: 79.4 kg 79.3 kg    Exam:  General:  Appears calm and comfortable and is in NAD, sitting in chair; moved to bedside commode and very unsteady, required 2 person assist Eyes:  normal lids, iris ENT:  grossly normal hearing, lips & tongue, mmm Cardiovascular:  RRR. No LE edema.  Respiratory:   CTA bilaterally with no wheezes/rales/rhonchi.  Normal respiratory effort. Abdomen:  soft, NT, ND Skin:  no rash or induration seen on limited exam Musculoskeletal:  generalized weakness, no bony abnormality Psychiatric:  grossly normal mood and affect, speech fluent and appropriate, AOx3 Neurologic:  CN 2-12 grossly intact, moves all extremities in coordinated fashion  Data Reviewed: I have reviewed the patient's lab results since admission.  Pertinent labs for today include:   Na++ 127 CO2 20 Glucose 105 WBC 3.6, stable Hgb 9.3, stable     Family Communication: Son was present  Mobility: PT/OT Consulted and are recommending - Acute Inpatient Rehab (3hours/Day)02/28/2024 1500    Code Status: Full Code    Disposition: Status is: Inpatient Remains inpatient appropriate because: ongoing management     Time spent: 50 minutes  Unresulted Labs (From admission, onward)     Start     Ordered   02/29/24 0500  CK  Weekly,   R (with TIMED occurrences)     Question:  Specimen collection method  Answer:  Unit=Unit collect   02/28/24 1440   02/27/24 0500  CBC  Daily,   R     Question:  Specimen collection method  Answer:  Unit=Unit collect    02/26/24 2203   02/27/24 0500  Basic metabolic panel with GFR  Daily,   R     Question:  Specimen collection method  Answer:  Unit=Unit collect   02/26/24 2203   02/24/24 1255  Body fluid culture w Gram Stain  Once,   R       Comments: Culture from left psoas abscess per IR   Question Answer Comment  Are there also cytology or pathology orders on this specimen? No   Patient immune status Normal      02/24/24 1256             Author: Delon Herald, MD 02/28/2024 5:24 PM  For on call review [email or web address].            "

## 2024-02-28 NOTE — Progress Notes (Signed)
 PHARMACY CONSULT NOTE FOR:  OUTPATIENT  PARENTERAL ANTIBIOTIC THERAPY (OPAT)  Indication: Psoas abscess Regimen: Daptomycin 600 mg IV every 24 hours + Cefepime  2 gm IV Q 8 hours  End date: 04/10/24  IV antibiotic discharge orders are pended. To discharging provider:  please sign these orders via discharge navigator,  Select New Orders & click on the button choice - Manage This Unsigned Work.     Thank you for allowing pharmacy to be a part of this patient's care.  Damien Quiet, PharmD, BCPS, BCIDP Infectious Diseases Clinical Pharmacist Phone: [phone] 02/28/2024, 2:41 PM

## 2024-02-28 NOTE — Assessment & Plan Note (Addendum)
 Patient reports multiple falls in last few weeks that he attributes to leg weakness, pain, and tremors Fall precautions PT/ OT consulted He is being considered for CIR vs. STR at [facility] Based on lack of 24/7 care at home, he is being recommended for STR  Will reach out to [facility] to check on status

## 2024-02-28 NOTE — Assessment & Plan Note (Addendum)
 Hospital delirium, alcohol withdrawal with history of dementia MRI brain unremarkable Off precedex  drip Taper off phenobarbital , will complete on 1/2 Continue delirium precaution, frequent reorientation CT head negative for acute findings This issue appears to have resolved and his son is concerned that it was a paradoxical response to Ativan ; added as allergy

## 2024-02-28 NOTE — Assessment & Plan Note (Addendum)
 Resumed Eliquis  12/31 Continue flecainide  and Coreg  for rate/rhythm control Has prolonged QTc Monitor on telemetry

## 2024-02-28 NOTE — Progress Notes (Signed)
 Physical Therapy Treatment Patient Details Name: Charles Lloyd. MRN: [id number] DOB: [birth year] Today's Date: 02/28/2024   History of Present Illness Charles Lloyd. is a 84 y.o. male who presented to the emergency department on 02/22/2024 for leg pain and weakness, with family reporting multiple falls over the past 6 to 8 months.  MRI lumbar spine revealed a new 3.5 x 2.4 cm fluid collection in the left psoas muscle concerning for abscess.  Neurosurgery was consulted at that time and recommended IV antibiotics and possible aspiration of abscess.  with significant PMH of hypertension, hyperlipidemia, type 2 diabetes, atrial fibrillation on anticoagulation, s/p recent L4-5 trans psoas lumbar interbody fusion left-sided approach on 02/06/2024, CHF,gout,CAD, TKA with infection, neuropathy    PT Comments   Pt admitted with above diagnosis.  Pt currently with functional limitations due to the deficits listed below (see PT Problem List). Pt seated in recliner when PT arrived. Pt agreeable to therapy intervention. Visitors present. Pt on RA. Pt continues to endorse back and LE pain. Pt required increased time min cues and mod A for sit to stand  from recliner, gait tasks in hallway 45 feet with RW, CGA, cues, slow cadence and flexed posture with recliner close. Pt returned to recliner, all needs in place and family present. Patient will benefit from intensive inpatient follow-up therapy, >3 hours/day. Pt will benefit from acute skilled PT to increase their independence and safety with mobility to allow discharge.  PT upgraded goals for transfers and gait tasks at this time.     If plan is discharge home, recommend the following: Assist for transportation;Help with stairs or ramp for entrance;Assistance with cooking/housework;A lot of help with walking and/or transfers;A lot of help with bathing/dressing/bathroom   Can travel by private vehicle        Equipment Recommendations  None recommended by  PT    Recommendations for Other Services Rehab consult     Precautions / Restrictions Precautions Precautions: Fall Precaution Booklet Issued: Yes (comment) Recall of Precautions/Restrictions: Impaired Restrictions Weight Bearing Restrictions Per Provider Order: No     Mobility  Bed Mobility               General bed mobility comments: pt was received seated in the recliner and returned to recliner at end of session    Transfers Overall transfer level: Needs assistance Equipment used: Rolling walker (2 wheels) Transfers: Sit to/from Stand Sit to Stand: Mod assist           General transfer comment: pt required increased time, cues for scooting to edge of recliner and push to stand with A for power up and inital steady with posterior lean    Ambulation/Gait Ambulation/Gait assistance: Contact guard assist Gait Distance (Feet): 45 Feet Assistive device: Rolling walker (2 wheels) Gait Pattern/deviations: Step-through pattern, Decreased stride length, Trunk flexed Gait velocity: Decreased     General Gait Details: pt demonstrates flexed posture and is unable to modifiy trunk or cervical for improved visual scanning pt states his back will not let him stand upright, pt required min cues, CGA and recliner close with family assist for pushing recliner   Stairs             Wheelchair Mobility     Tilt Bed    Modified Rankin (Stroke Patients Only)       Balance Overall balance assessment: History of Falls Sitting-balance support: Feet supported Sitting balance-Leahy Scale: Fair     Standing balance support: During functional  activity, Reliant on assistive device for balance, Bilateral upper extremity supported Standing balance-Leahy Scale: Poor                              Communication Communication Communication: No apparent difficulties  Cognition Arousal: Alert Behavior During Therapy: WFL for tasks assessed/performed   PT -  Cognitive impairments: No apparent impairments                         Following commands: Intact Following commands impaired: Follows multi-step commands with increased time    Cueing Cueing Techniques: Verbal cues  Exercises      General Comments General comments (skin integrity, edema, etc.): pt on RA and maintained >/=90% with PT intervention      Pertinent Vitals/Pain Pain Assessment Pain Assessment: 0-10 Pain Score: 5  Pain Location: distal BLE and low back Pain Descriptors / Indicators: Tingling Pain Intervention(s): Limited activity within patient's tolerance, Monitored during session, Repositioned    Home Living                          Prior Function            PT Goals (current goals can now be found in the care plan section) Acute Rehab PT Goals Patient Stated Goal: To be able to manage at home alone. PT Goal Formulation: Patient unable to participate in goal setting Time For Goal Achievement: 03/10/24 Potential to Achieve Goals: Fair Progress towards PT goals: Progressing toward goals    Frequency    Min 3X/week      PT Plan      Co-evaluation              AM-PAC PT 6 Clicks Mobility   Outcome Measure  Help needed turning from your back to your side while in a flat bed without using bedrails?: A Lot Help needed moving from lying on your back to sitting on the side of a flat bed without using bedrails?: A Lot Help needed moving to and from a bed to a chair (including a wheelchair)?: A Little Help needed standing up from a chair using your arms (e.g., wheelchair or bedside chair)?: A Little Help needed to walk in hospital room?: A Little Help needed climbing 3-5 steps with a railing? : Total 6 Click Score: 14    End of Session Equipment Utilized During Treatment: Gait belt Activity Tolerance: Patient limited by fatigue Patient left: with call bell/phone within reach;in chair;with chair alarm set;with family/visitor  present Nurse Communication: Mobility status PT Visit Diagnosis: Unsteadiness on feet (R26.81);Pain Pain - Right/Left: Left Pain - part of body: Leg     Time: 1150-1208 PT Time Calculation (min) (ACUTE ONLY): 18 min  Charges:    $Gait Training: 8-22 mins PT General Charges $$ ACUTE PT VISIT: 1 Visit                     [place], PT Acute Rehab    [place] VEAR Drone 02/28/2024, 3:11 PM

## 2024-02-28 NOTE — Consult Note (Signed)
 "        [facility]    Date of Admission:  02/22/2024     Reason for Consult: left psoas abscess    Referring Provider: Barbarann     Lines:  Peirpheral iv's  Abx: 1/2-c dapto/cefepime         Assessment: 84 yo male 12/21 s/p lumbar l4-5 fusion, course with with left psoas abscess as well  Admission 12/27 due to fall/pain no fever. Mri showed left psoas abscess. Given abx. No fever but then developed on on 12/29 and ir aspirated abscess same day --> cx ngtd  Surgical site no dehiscence or cellulitis and mri doesn't show any surgical site fluid collection  Leakage of surgical site spine into psoas is not uncommon in hematogenous spread of infection. But agree that this is rather strange when the surgical site looks great     Planning to treat as involved l4-5 site infection/hardware complicated  Plan: Change abx do dapto/cefepime  and opat as below Maintain standard isolation precaution Picc ordered Will sign off Discussed with primary team     OPAT Orders Discharge antibiotics to be given via PICC line Discharge antibiotics: Dapto 600 mg daily Cefepime  2 gram iv q8hrs   Duration: 6 weeks End Date: 04/10/24  [facility] Per Protocol:  Home health RN for IV administration and teaching; PICC line care and labs.    Labs weekly while on IV antibiotics: _x_ CBC with differential __ BMP _x_ CMP _x_ CRP _x_ ESR __ Vancomycin  trough _x_ CK  _x_ Please pull PIC at completion of IV antibiotics __ Please leave PIC in place until doctor has seen patient or been notified  Fax weekly labs to [phone]  Clinic Follow Up Appt: 1/20 @ 11am  @  [facility] 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 is a 84 y.o. 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   IR aspirate 12/29 the psoas and cx negative  He remains free fever since 12/29 isolated episode  Pain better No other complaint    Family History  Problem Relation Age of Onset   Cancer Mother    Depression Mother    Early death Mother    Cancer Father    Depression Father    Early death Father     Social History[1]  Allergies[2]  Review of Systems: ROS All Other ROS was negative, except mentioned above   Past Medical History:  Diagnosis Date  Acute tracheobronchitis 01/05/2019   B12 deficiency    Basal cell carcinoma (BCC) of left side of nose 01/28/2018   Cancer (HCC)    Cardiomyopathy (HCC) 03/10/2018   With chronic atrial fibrillation   CHF (congestive heart failure) (HCC) 04/16/2013   Functional class II, ejection fraction 35-40%  Formatting of this note might be different from the original. Functional class II, ejection fraction 35-40%   Chronic anticoagulation    Chronic diastolic (congestive) heart failure (HCC) 04/16/2013   Functional class II, ejection fraction 35-40%  Last Assessment & Plan:   Clinically stable Volume well controlled meds reviewed   Chronic diastolic CHF (congestive heart failure) (HCC) 04/16/2013   Functional class II, ejection fraction 35-40%  Last Assessment & Plan:  Clinically stable Volume well controlled meds reviewed   Colon polyps 04/16/2013   Coronary artery disease involving native coronary artery of native heart without angina pectoris 04/05/2022   Myoview  05/25/2019: EF 60, normal perfusion, low risk CCTA (AF protocol) 04/25/20: CAC score 4242 (99th percentile)   Diabetic neuropathy (HCC)    Difficult intubation    pt reports difficult intubation during surgery in [place] in 2015   Dysrhythmia    A.Fib   Eosinophil count raised 02/17/2019   Essential hypertension 01/06/2010   Last Assessment & Plan:  Well controlled Continue med management   Gout    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  Steroid usage was discontinued several months ago.  Follow up with Dermatology as planned.   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   Last Assessment & Plan:  Id following ?ongoing abx managementy reported Request records   Infection of prosthetic right knee joint    Insomnia 03/04/2012   Grief with loss of wife 02/20/12  Last Assessment & Plan:  Improved sx  contineu xanax prn Se discussed   Lesion of liver 04/20/2019   -On cardiac CT 02/2019   Mild reactive airways disease    Neoplasm of prostate 04/16/2013   Neoplasm of prostate, 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 Transthoracic Echocardiogram-03/10/2013-[facility]: Normal left ventricular wall thickness and cavity size.  Global left ventricular systolic function is moderately reduced.  The estimated ejection fraction is 35-40%.  The left atrium is moderately enlarged.  The right atrium is mildly enlarged.  No significant valvular    Overweight (BMI 25.0-29.9) 10/22/2016   Persistent atrial fibrillation (HCC) 04/16/2013   Last Assessment & Plan:  Rate controlled Continue med management eliquis  for stroke preventino  Formatting of this note might be different from the original.  Drug  HX Current Rx Pre-ABL inefficacy Pre-ABL intolerant Post-ABL inefficacy Post-ABL intolerant max dose/24h M/Y end comments  sotalol                  dofetilide  flecainide                   propafenone                  am   S/P TKR (total knee replacement), bilateral    Secondary hypercoagulable state 04/09/2019   Tinea cruris 04/16/2013   Type 2 diabetes mellitus with diabetic polyneuropathy, without long-term current use of insulin  (HCC) 04/16/2013   Last Assessment & Plan:  Labs today Pt reports well controlled on ambulatory monitoring   Vitamin D  deficiency 07/25/2018       Scheduled Meds:  apixaban   5 mg Oral BID   atorvastatin   20 mg Oral QHS   carvedilol   3.125 mg Oral BID   cycloSPORINE   1 drop Both Eyes Daily   flecainide   100 mg Oral BID   folic acid   1 mg Oral Daily   gabapentin   300 mg Oral Daily   gabapentin   600 mg Oral QPM   hydrALAZINE   25 mg Oral BID   hydrocortisone  cream   Topical TID   insulin  aspart  0-6 Units Subcutaneous Q4H   montelukast   10 mg Oral QHS   sodium chloride  flush  3 mL Intravenous Q12H   thiamine   100 mg Oral Daily   Or   thiamine   100 mg Intravenous Daily   Continuous  Infusions:  ceFEPime  (MAXIPIME ) IV     DAPTOmycin 600 mg (02/28/24 1615)   PRN Meds:.acetaminophen  **OR** acetaminophen , bisacodyl , cyclobenzaprine , mouth rinse, senna-docusate, sodium chloride  flush   OBJECTIVE: Blood pressure 125/74, pulse 67, temperature 98.8 F (37.1 C), resp. rate 16, height 5' 8 (1.727 m), weight 79.3 kg, SpO2 97%.  Physical Exam General/constitutional: no distress, pleasant HEENT: Normocephalic, PER, Conj Clear, EOMI, Oropharynx clear Neck supple CV: rrr no mrg Lungs: clear to auscultation, normal respiratory effort Abd: Soft, Nontender Ext: no edema Skin: No Rash Neuro: nonfocal MSK: nontender lower back -- 2 vertical incision no dehiscence; no cellulitis change or fluctuance    Lab Results Lab Results  Component Value Date   WBC 3.6 (L) 02/28/2024   HGB 9.3 (L) 02/28/2024   HCT 27.8 (L) 02/28/2024   MCV 86.9 02/28/2024   PLT 197 02/28/2024    Lab Results  Component Value Date   CREATININE 0.65 02/28/2024   BUN 6 (L) 02/28/2024   NA 127 (L) 02/28/2024   K 3.6 02/28/2024   CL 98 02/28/2024   CO2 20 (L) 02/28/2024    Lab Results  Component Value Date   ALT 11 02/24/2024   AST 27 02/24/2024   ALKPHOS 104 02/24/2024   BILITOT 1.1 02/24/2024      Microbiology: Recent Results (from the past 240 hours)  Blood culture (routine x 2)     Status: None   Collection Time: 02/22/24 11:24 PM   Specimen: BLOOD  Result Value Ref Range Status   Specimen Description   Final    BLOOD BLOOD RIGHT FOREARM Performed at [facility], [address] [place], [place] [address]    Special Requests   Final    BOTTLES DRAWN AEROBIC AND ANAEROBIC Blood Culture adequate volume Performed at [facility], [address], [place], [place] [address]    Culture   Final    NO GROWTH 5 DAYS Performed at [facility], [address], [place], [place] [address]    Report Status 02/28/2024 FINAL  Final  Blood culture  (routine x 2)  Status: None   Collection Time: 02/22/24 11:35 PM   Specimen: BLOOD  Result Value Ref Range Status   Specimen Description   Final    BLOOD LEFT ANTECUBITAL Performed at [facility], [address] [place], [place] [address]    Special Requests   Final    BOTTLES DRAWN AEROBIC AND ANAEROBIC Blood Culture adequate volume Performed at [facility], [address], [place], [place] [address]    Culture   Final    NO GROWTH 5 DAYS Performed at [facility], [address], [place], [place] [address]    Report Status 02/28/2024 FINAL  Final  Aerobic/Anaerobic Culture w Gram Stain (surgical/deep wound)     Status: None (Preliminary result)   Collection Time: 02/24/24  4:37 PM   Specimen: Body Fluid  Result Value Ref Range Status   Specimen Description   Final    FLUID Performed at [facility], [address], [place], [place] [address]    Special Requests   Final    NONE Performed at [facility], [address] [place], [place] [address]    Gram Stain   Final    FEW WBC PRESENT, PREDOMINANTLY PMN NO ORGANISMS SEEN    Culture   Final    NO GROWTH 4 DAYS NO ANAEROBES ISOLATED; CULTURE IN PROGRESS FOR 5 DAYS Performed at [facility], [address], [place], [place] [address]    Report Status PENDING  Incomplete     Serology:    Imaging: If present, new imagings (plain films, ct scans, and mri) have been personally visualized and interpreted; radiology reports have been reviewed. Decision making incorporated into the Impression / Recommendations.  12/27 mri lumbar spine 1. New 3.5 x 2.4 cm fluid collection in the left psoas muscle at the L4-L5 level with surrounding edema. The location is atypical for postoperative changes and is concerning for abscess. No obvious changes of discitis/osteomyelitis but hardware artifact limits assessment. Recommend correlation with  infectious markers. 2. At L2-L3, progressive severe canal stenosis and moderate to severe left foraminal stenosis. Similar moderate right foraminal stenosis. 3. At L3-L4, similar versus slightly progressive moderate to severe canal stenosis. 4. Interval fusion at L4-L5 with improved alignment but persistent severe canal stenosis. 5. Transitional lumbosacral vertebra is assumed to represent the S1 level. This conforms to the segmentation utilized on prior exams.    12/29 mri brain 1. No acute intracranial abnormality.     Constance ONEIDA Passer, MD [facility] [phone] pager    02/28/2024, 4:17 PM     [1]  Social History Tobacco Use   Smoking status: Never    Passive exposure: Never   Smokeless tobacco: Never  Vaping Use   Vaping status: Never Used  Substance Use Topics   Alcohol use: Not Currently    Alcohol/week: 14.0 standard drinks of alcohol    Types: 14 Shots of liquor per week    Comment: sips on rum and has brandy before bed nightly   Drug use: Never  [2]  Allergies Allergen Reactions   Amlodipine  Nausea Only, Swelling and Other (See Comments)    Severe edema and chest pain- also   Ativan  [Lorazepam ] Other (See Comments)    Made the patient fidgety and he hallucinates   Chlorhexidine  Gluconate Itching and Other (See Comments)    Only itching with wipes, not with soap   "

## 2024-02-28 NOTE — Assessment & Plan Note (Addendum)
 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 Presented with LLE weakness, pain, and recurrent falls Imaging with L psoas muscle abscess S/p IR CT guided aspiration of left psoas fluid collection, drain placed Continue broad-spectrum antibiotics - Zosyn , linezolid  No growth on fluid and blood cultures He continues to have significant LLE pain with standing but this does appear to be improving Pain control with APAP, gabapentin  ID consulted and recommends treatment as L4-5 site infection complicated by hardware He is recommended to change antibiotics to Dapto/Cefepime  and OPAT has been arranged Treatment for 6 weeks through 04/13/24 via PICC line

## 2024-02-28 NOTE — Assessment & Plan Note (Addendum)
-  Continue Coreg and hydralazine °

## 2024-02-28 NOTE — Assessment & Plan Note (Addendum)
 Wound 02/24/24 1245 Pressure Injury Buttocks Mid;Lower Deep Tissue Pressure Injury - Purple or maroon localized area of discolored intact skin or blood-filled blister due to damage of underlying soft tissue from pressure and/or shear. (Active)     Wound 02/24/24 1245 Pressure Injury Scrotum Circumferential Deep Tissue Pressure Injury - Purple or maroon localized area of discolored intact skin or blood-filled blister due to damage of underlying soft tissue from pressure and/or shear. (Active)  POA

## 2024-02-28 NOTE — Assessment & Plan Note (Addendum)
"  Continue atorvastatin   "

## 2024-02-28 NOTE — Progress Notes (Signed)
 [facility] IP rehab admissions - I spoke with patient and his son about rehab options.  Patient lives alone and son cannot provide 24/7 supervision for patient.  Patient does not have an alternative for a caregiver.  Son wishes to pursue SNF placement with [facility] in [place].  Son's wife has been talking with Randine Kobus at [facility] about possible admission.  Recommend pursuit of SNF placement at [facility] in [place] due to lack of sufficient caregiver support for discharge home after a potential inpatient rehab stay.  Call me for questions.  [phone]

## 2024-02-28 NOTE — Assessment & Plan Note (Addendum)
 Compensated, no exacerbation Hold torsemide 

## 2024-02-29 DIAGNOSIS — R339 Retention of urine, unspecified: Secondary | ICD-10-CM | POA: Diagnosis present

## 2024-02-29 DIAGNOSIS — K6812 Psoas muscle abscess: Secondary | ICD-10-CM | POA: Diagnosis not present

## 2024-02-29 LAB — BASIC METABOLIC PANEL WITH GFR
Anion gap: 9 (ref 5–15)
BUN: 6 mg/dL — ABNORMAL LOW (ref 8–23)
CO2: 22 mmol/L (ref 22–32)
Calcium: 8.6 mg/dL — ABNORMAL LOW (ref 8.9–10.3)
Chloride: 98 mmol/L (ref 98–111)
Creatinine, Ser: 0.78 mg/dL (ref 0.61–1.24)
GFR, Estimated: >60 mL/min
Glucose, Bld: 94 mg/dL (ref 70–99)
Potassium: 3.6 mmol/L (ref 3.5–5.1)
Sodium: 128 mmol/L — ABNORMAL LOW (ref 135–145)

## 2024-02-29 LAB — CBC
HCT: 27.3 % — ABNORMAL LOW (ref 39.0–52.0)
Hemoglobin: 9.1 g/dL — ABNORMAL LOW (ref 13.0–17.0)
MCH: 29.4 pg (ref 26.0–34.0)
MCHC: 33.3 g/dL (ref 30.0–36.0)
MCV: 88.1 fL (ref 80.0–100.0)
Platelets: 183 K/uL (ref 150–400)
RBC: 3.1 MIL/uL — ABNORMAL LOW (ref 4.22–5.81)
RDW: 16.9 % — ABNORMAL HIGH (ref 11.5–15.5)
WBC: 3.6 K/uL — ABNORMAL LOW (ref 4.0–10.5)
nRBC: 0 % (ref 0.0–0.2)

## 2024-02-29 LAB — AEROBIC/ANAEROBIC CULTURE W GRAM STAIN (SURGICAL/DEEP WOUND): Culture: NO GROWTH

## 2024-02-29 LAB — GLUCOSE, CAPILLARY
Glucose-Capillary: 100 mg/dL — ABNORMAL HIGH (ref 70–99)
Glucose-Capillary: 101 mg/dL — ABNORMAL HIGH (ref 70–99)
Glucose-Capillary: 78 mg/dL (ref 70–99)
Glucose-Capillary: 80 mg/dL (ref 70–99)
Glucose-Capillary: 86 mg/dL (ref 70–99)
Glucose-Capillary: 97 mg/dL (ref 70–99)

## 2024-02-29 LAB — CK: Total CK: 26 U/L — ABNORMAL LOW (ref 49–397)

## 2024-02-29 MED ORDER — TAMSULOSIN HCL 0.4 MG PO CAPS
0.4000 mg | ORAL_CAPSULE | Freq: Every day | ORAL | Status: DC
  Administered 2024-02-29 – 2024-03-30 (×26): 0.4 mg via ORAL
  Filled 2024-02-29 (×29): qty 1

## 2024-02-29 MED ORDER — SODIUM CHLORIDE 0.9% FLUSH: 10.0000 mL | INTRAVENOUS | Status: DC | PRN

## 2024-02-29 MED ORDER — CHLORHEXIDINE GLUCONATE CLOTH 2 % EX PADS
6.0000 | MEDICATED_PAD | Freq: Every day | CUTANEOUS | Status: DC
  Administered 2024-03-02 – 2024-03-30 (×28): 6 via TOPICAL

## 2024-02-29 NOTE — Assessment & Plan Note (Addendum)
 Compensated, no exacerbation Hold torsemide 

## 2024-02-29 NOTE — Assessment & Plan Note (Addendum)
 Hospital delirium, alcohol withdrawal with history of dementia MRI brain unremarkable Off precedex  drip Taper off phenobarbital , will complete on 1/2 Continue delirium precaution, frequent reorientation CT head negative for acute findings This issue appears to have resolved and his son is concerned that it was a paradoxical response to Ativan ; added as allergy

## 2024-02-29 NOTE — Assessment & Plan Note (Signed)
 Remote h/o prostatectomy Has had periodic issues with retention Has had indwelling foley in the past Having retention issues while here requiring I/O caths Will place foley Start tamsulosin  Will need urology follow up appointment with voiding trial in the future

## 2024-02-29 NOTE — Assessment & Plan Note (Addendum)
 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

## 2024-02-29 NOTE — Assessment & Plan Note (Addendum)
 Resumed Eliquis  12/31 Continue flecainide  and Coreg  for rate/rhythm control Has prolonged QTc Will recheck EKG to see if telemetry can be discontinued

## 2024-02-29 NOTE — Assessment & Plan Note (Addendum)
 Wound 02/24/24 1245 Pressure Injury Buttocks Mid;Lower Deep Tissue Pressure Injury - Purple or maroon localized area of discolored intact skin or blood-filled blister due to damage of underlying soft tissue from pressure and/or shear. (Active)     Wound 02/24/24 1245 Pressure Injury Scrotum Circumferential Deep Tissue Pressure Injury - Purple or maroon localized area of discolored intact skin or blood-filled blister due to damage of underlying soft tissue from pressure and/or shear. (Active)  POA

## 2024-02-29 NOTE — Assessment & Plan Note (Addendum)
"  Continue atorvastatin   "

## 2024-02-29 NOTE — Assessment & Plan Note (Addendum)
 A1c was 5.9% earlier this month, good control Cover with very sensitive-scale SSI

## 2024-02-29 NOTE — Plan of Care (Signed)

## 2024-02-29 NOTE — Progress Notes (Signed)
 Peripherally Inserted Central Catheter Placement  The IV Nurse has discussed with the patient and/or persons authorized to consent for the patient, the purpose of this procedure and the potential benefits and risks involved with this procedure.  The benefits include less needle sticks, lab draws from the catheter, and the patient may be discharged home with the catheter. Risks include, but not limited to, infection, bleeding, blood clot (thrombus formation), and puncture of an artery; nerve damage and irregular heartbeat and possibility to perform a PICC exchange if needed/ordered by physician.  Alternatives to this procedure were also discussed.  Bard Power PICC patient education guide, fact sheet on infection prevention and patient information card has been provided to patient /or left at bedside.  Telephone consent obtained from so due to  pt slightly confused re need for PICC and antibiotics.  PICC Placement Documentation  PICC Single Lumen 02/29/24 Right Basilic 40 cm 1 cm (Active)  Indication for Insertion or Continuance of Line Prolonged intravenous therapies 02/29/24 1318  Exposed Catheter (cm) 1 cm 02/29/24 1318  Site Assessment Clean, Dry, Intact 02/29/24 1318  Line Status Flushed;Saline locked;Blood return noted 02/29/24 1318  Dressing Type Transparent;Securing device 02/29/24 1318  Dressing Status Antimicrobial disc/dressing in place;Clean, Dry, Intact 02/29/24 1318  Line Care Connections checked and tightened;Tubing changed 02/29/24 1318  Line Adjustment (NICU/IV Team Only) No 02/29/24 1318  Dressing Intervention New dressing;Adhesive placed at insertion site (IV team only);Adhesive placed around edges of dressing (IV team/ICU RN only) 02/29/24 1318  Dressing Change Due 03/07/24 02/29/24 1318       Charles Lloyd 02/29/2024, 1:19 PM

## 2024-02-29 NOTE — Progress Notes (Addendum)
 " Progress Note   Patient: Charles Lloyd. FMW:[id number] DOB: [birth year] DOA: 02/22/2024     7 DOS: the patient was seen and examined on 02/29/2024   Brief hospital course: 84yo with h/o HTN, HLD, T2DM, afib on Eliquis , and L4-5 spondylolisthesis s/p fusion on 12/11 who presented with LLE weakness and pain with falls.  He was found to have a 3.5 x 2.4 cm L psoas muscle abscess.  Neurosurgery recommended IR drainage and IV antibiotics.  CT-guided aspiration on 12/29.  Developed hospital-associated delirium requiring ICU transfer, now improved.  Possible dc to CIR.    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 He is recommended to change antibiotics to Dapto/Cefepime  and OPAT has been arranged Treatment for 6 weeks through 04/13/24 via PICC line He is medically stable for dc to STR when a bed is available Delirium Alcohol withdrawal syndrome, with delirium [facility] Hospital delirium, alcohol withdrawal with history of dementia MRI brain unremarkable Off precedex  drip Taper off phenobarbital , will complete on 1/2 Continue delirium precaution, frequent reorientation CT head negative for acute findings This issue appears to have resolved and his son is concerned that it was a paradoxical response to Ativan ; added as allergy Paroxysmal A-fib (HCC) Resumed Eliquis  12/31 Continue flecainide  and Coreg  for rate/rhythm control Has prolonged QTc Will  recheck EKG to see if telemetry can be discontinued Chronic heart failure with preserved ejection fraction (HFpEF) (HCC) Compensated, no exacerbation Hold torsemide  NIDDM-2 with polyneuropathy and hyperglycemia A1c was 5.9% earlier this month, good control Cover with very sensitive-scale SSI Hypercholesterolemia Continue atorvastatin  Asthma No exacerbation Continue Singulair  and as-needed albuterol  Essential hypertension Continue Coreg  and hydralazine  Recurrent falls Patient reports multiple falls in last few weeks that he attributes to leg weakness, pain, and tremors Fall precautions PT/ OT consulted He is being considered for CIR vs. STR  Based on lack of 24/7 care at home, he is being recommended for STR  Unable to get a bed at [facility] so expanding search today to [employer] [facility] Pressure injury Wound 02/24/24 1245 Pressure Injury Buttocks Mid;Lower Deep Tissue Pressure Injury - Purple or maroon localized area of discolored intact skin or blood-filled blister due to damage of underlying soft tissue from pressure and/or shear. (Active)     Wound 02/24/24 1245 Pressure Injury Scrotum Circumferential Deep Tissue Pressure Injury - Purple or maroon localized area of discolored intact skin or blood-filled blister due to damage of underlying soft tissue from pressure and/or shear. (Active)  POA Acute on chronic urinary retention Remote h/o prostatectomy Has had periodic issues with retention Has had indwelling foley in the past Having retention issues while here requiring I/O caths Will place foley Start tamsulosin  Will need urology follow up appointment with voiding trial in the future      Consultants: Neurosurgery IR PCCM PT OT ICM team   Procedures: CT-guided aspiration of L psoas fluid collection 12/29   Antibiotics: Linezolid  12/28- Zosyn  12/27- Vancomycin  x 1  30 Day Unplanned Readmission Risk Score    Flowsheet Row  ED to Hosp-Admission (Current)  from 02/22/2024 in [facility] 4TH FLOOR PROGRESSIVE CARE AND UROLOGY  30 Day Unplanned Readmission Risk Score (%) 28.16 Filed at 02/29/2024 0400    This score is the patient's risk of an unplanned readmission within 30 days of being discharged (0 -100%). The score is based on dignosis, age, lab data, medications, orders, and past utilization.   Low:  0-14.9   Medium: 15-21.9   High: 22-29.9   Extreme: 30 and above           Subjective: Feeling better, ambulating better.  Ready for rehab when possible.   Objective: Vitals:   02/28/24 2133 02/29/24 0427  BP: (!) 153/75 139/69  Pulse: 84 81  Resp: (!) 21 16  Temp: 97.6 F (36.4 C) 98 F (36.7 C)  SpO2: 96% 94%    Intake/Output Summary (Last 24 hours) at 02/29/2024 1300 Last data filed at 02/29/2024 0524 Gross per 24 hour  Intake 402 ml  Output 475 ml  Net -73 ml   Filed Weights   02/22/24 1238 02/24/24 1245  Weight: 79.4 kg 79.3 kg    Exam:  General:  Appears calm and comfortable and is in NAD Eyes:  normal lids, iris ENT:  grossly normal hearing, lips & tongue, mmm Cardiovascular:  RRR. No LE edema.  Respiratory:   CTA bilaterally with no wheezes/rales/rhonchi.  Normal respiratory effort. Abdomen:  soft, NT, ND Skin:  no rash or induration seen on limited exam Musculoskeletal:  grossly normal tone BUE/BLE, good ROM, no bony abnormality Psychiatric:  grossly normal mood and affect, speech fluent and appropriate, AOx3 Neurologic:  CN 2-12 grossly intact, moves all extremities in coordinated fashion  Data Reviewed: I have reviewed the patient's lab results since admission.  Pertinent labs for today include:   Na++ 128, stable CK 26 WBC 3.6 Hgb 9.1     Family Communication: Son was present  Mobility: PT/OT Consulted and are recommending - Acute Inpatient Rehab (3hours/Day)02/28/2024 1500    Code Status: Full Code   Disposition: Status is: Inpatient Remains inpatient appropriate because: awaiting  placement     Time spent: 50 minutes  Unresulted Labs (From admission, onward)     Start     Ordered   02/29/24 0500  CK  Weekly,   R     Question:  Specimen collection method  Answer:  Unit=Unit collect   02/28/24 1440   02/24/24 1255  Body fluid culture w Gram Stain  Once,   R       Comments: Culture from left psoas abscess per IR   Question Answer Comment  Are there also cytology or pathology orders on this specimen? No   Patient immune status Normal      02/24/24 1256             Author: Delon Herald, MD 02/29/2024 1:00 PM  For on call review [email or web address].            "

## 2024-02-29 NOTE — Assessment & Plan Note (Addendum)
 No exacerbation Continue Singulair  and as-needed albuterol 

## 2024-02-29 NOTE — Assessment & Plan Note (Addendum)
-  Continue Coreg and hydralazine °

## 2024-02-29 NOTE — TOC Progression Note (Addendum)
 Transition of Care (TOC) - Progression Note    Patient Details  Name: Charles Lloyd. MRN: [id number] Date of Birth: 1941/01/20  Transition of Care [facility]) CM/SW Contact  Jon ONEIDA Anon, RN Phone Number: 02/29/2024, 4:05 PM  Clinical Narrative:    Pt not candidate for CIR. Pt does not meet criteria due to no 24/7 support in the home. Pt sent out for for SNF placement. Pt/ pt family agreeable. Pt family family prefers [facility] [place]. RNCM spoke with Randine at [facility] and she states no bed availability. Pt medically stable for discharge. ICM will follow-up to learn pt choice.   Addendum: Pt son called back and states they would like to chose bed at [facility]. Insurance Authorization started, awaiting approval. ICM following.  The following bed offers were presented to the pt/ pt son:  Service Provider Request Status Address Phone Fax Patient Preferred  [facility]  Accepted [address], [place] [address] [phone] --2 stars  [facility]  Accepted [address] [place] [address] [phone] --2 stars  [facility]  Accepted [address], [place] [address] [phone] --1 star  [facility]  Considering Need clinical review [address], [place] [address] [phone] --  [facility] Preferred SNF  Considering [address] [place] [address] [phone] --  [facility] Preferred SNF  Pending - Request Sent [address], [place] [address] [phone] --  [facility]  Pending - Request Sent [address] [place] [address] [phone] --  [facility] Preferred SNF  Pending - Request Sent [address], [place] [address] [phone] --  [facility]  Preferred SNF  Declined Cannot meet patient's needs [address], [place] [address] [phone] --  [facility] Preferred SNF  Declined [address] [place] [address] [phone] --  [facility], [facility] Preferred SNF  Declined Cost of care [address], [place] [address] [phone] --  [facility] [place] Preferred SNF  Declined No bed availablity [address] [place] [address] [phone] --  [address] SNF  Declined [address] [place], [place] [address] [phone] --  [facility] Preferred SNF  Declined [address], [place] [address] [phone] --  [facility]  Declined [address] [place] [address] [phone] --  [facility] Preferred SN[facility]  Declined no bed [address] [place] [address] [phone] --  [facility]  Declined Facility declined (no reason given) [address] [place] [address] [phone] --  [facility]  Declined Cannot meet patient's needs [address] [place] [address] [phone] -- --  [facility] Preferred SNF  Declined Facility declined (no reason given) [address] [place] [address] [phone] --  [facility] PREFERRED SNF/ALF  Declined Facility full [address], [place] [address] [phone] --  [facility] SNF/ALF  Declined Out of network [address], [place] [address]       Expected Discharge Plan: Skilled Nursing Facility Barriers to Discharge: Continued Medical Work up               Expected Discharge Plan and Services In-house  Referral: NA Discharge Planning Services: CM Consult Post Acute  Care Choice: Durable Medical Equipment, IP Rehab Living arrangements for the past 2 months: Single Family Home                                       Social Drivers of Health (SDOH) Interventions SDOH Screenings   Food Insecurity: No Food Insecurity (02/23/2024)  Housing: Low Risk (02/23/2024)  Transportation Needs: No Transportation Needs (02/23/2024)  Utilities: Not At Risk (02/23/2024)  Alcohol Screen: Low Risk (12/21/2022)  Depression (PHQ2-9): Low Risk (02/11/2024)  Financial Resource Strain: Low Risk (12/21/2022)  Physical Activity: Inactive (12/21/2022)  Social Connections: Socially Isolated (02/23/2024)  Stress: No Stress Concern Present (12/21/2022)  Tobacco Use: Low Risk (02/22/2024)  Health Literacy: Adequate Health Literacy (12/21/2022)    Readmission Risk Interventions    02/26/2024    2:28 PM 08/08/2021   10:00 AM  Readmission Risk Prevention Plan  Transportation Screening Complete Complete  PCP or Specialist Appt within 3-5 Days Complete   HRI or Home Care Consult Complete   Social Work Consult for Recovery Care Planning/Counseling Complete   Palliative Care Screening Not Applicable   Medication Review Oceanographer) Complete Complete  PCP or Specialist appointment within 3-5 days of discharge  Complete  HRI or Home Care Consult  Complete  SW Recovery Care/Counseling Consult  Not Complete  Palliative Care Screening  Not Applicable  Skilled Nursing Facility  Not Applicable

## 2024-02-29 NOTE — Assessment & Plan Note (Addendum)
 Patient reports multiple falls in last few weeks that he attributes to leg weakness, pain, and tremors Fall precautions PT/ OT consulted He is being considered for CIR vs. STR  Based on lack of 24/7 care at home, he is being recommended for STR  Unable to get a bed at [facility] so expanding search today to [facility]

## 2024-03-01 DIAGNOSIS — K6812 Psoas muscle abscess: Secondary | ICD-10-CM | POA: Diagnosis not present

## 2024-03-01 LAB — GLUCOSE, CAPILLARY
Glucose-Capillary: 103 mg/dL — ABNORMAL HIGH (ref 70–99)
Glucose-Capillary: 104 mg/dL — ABNORMAL HIGH (ref 70–99)
Glucose-Capillary: 105 mg/dL — ABNORMAL HIGH (ref 70–99)
Glucose-Capillary: 89 mg/dL (ref 70–99)
Glucose-Capillary: 91 mg/dL (ref 70–99)
Glucose-Capillary: 94 mg/dL (ref 70–99)

## 2024-03-01 MED ORDER — SODIUM CHLORIDE 0.9% FLUSH: 10.0000 mL | INTRAVENOUS | Status: AC | PRN

## 2024-03-01 MED ORDER — TAMSULOSIN HCL 0.4 MG PO CAPS: 0.4000 mg | ORAL_CAPSULE | Freq: Every day | ORAL | Status: AC

## 2024-03-01 MED ORDER — HYDROCORTISONE 1 % EX CREA: TOPICAL_CREAM | Freq: Three times a day (TID) | CUTANEOUS | Status: AC

## 2024-03-01 MED ORDER — ACETAMINOPHEN 325 MG PO TABS: 650.0000 mg | ORAL_TABLET | Freq: Four times a day (QID) | ORAL | Status: AC | PRN

## 2024-03-01 MED ORDER — GABAPENTIN 300 MG PO CAPS: 300.0000 mg | ORAL_CAPSULE | ORAL | 0 refills | Status: AC

## 2024-03-01 MED ORDER — SODIUM CHLORIDE 0.9% FLUSH: 3.0000 mL | Freq: Two times a day (BID) | INTRAVENOUS | Status: AC

## 2024-03-01 MED ORDER — FOLIC ACID 1 MG PO TABS: 1.0000 mg | ORAL_TABLET | Freq: Every day | ORAL | Status: AC

## 2024-03-01 MED ORDER — INSULIN ASPART 100 UNIT/ML IJ SOLN
0.0000 [IU] | Freq: Three times a day (TID) | INTRAMUSCULAR | Status: DC
  Administered 2024-03-05 (×2): 1 [IU] via SUBCUTANEOUS
  Administered 2024-03-07: 3 [IU] via SUBCUTANEOUS
  Filled 2024-03-01: qty 1
  Filled 2024-03-01 (×2): qty 3
  Filled 2024-03-01: qty 1

## 2024-03-01 NOTE — Assessment & Plan Note (Deleted)
-  Continue Coreg and hydralazine °

## 2024-03-01 NOTE — Assessment & Plan Note (Deleted)
 Hospital delirium, alcohol withdrawal with history of dementia MRI brain unremarkable Off precedex  drip Taper off phenobarbital , will complete on 1/2 Continue delirium precaution, frequent reorientation CT head negative for acute findings This issue appears to have resolved and his son is concerned that it was a paradoxical response to Ativan ; added as allergy

## 2024-03-01 NOTE — Assessment & Plan Note (Deleted)
 Resumed Eliquis  12/31 Continue flecainide  and Coreg  for rate/rhythm control Has prolonged QTc Will recheck EKG to see if telemetry can be discontinued

## 2024-03-01 NOTE — Assessment & Plan Note (Deleted)
 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

## 2024-03-01 NOTE — TOC Progression Note (Signed)
 Transition of Care (TOC) - Progression Note    Patient Details  Name: Charles Lloyd. MRN: [id number] Date of Birth: [birth year]  Transition of Care [facility]) CM/SW Contact  Hoy DELENA Bigness, LCSW Phone Number: 03/01/2024, 12:25 PM  Clinical Narrative:    CSW called pt's son to inform of discharge plans for today. Per son he went by [facility] to view and now states he does not want his father to discharge to [facility] or any of the other SNF's that have offered. He requests pt be re-evaluated for CIR with plan to obtain 24/7 support after discharge. Message sent to CIR to re-assess. MD notified. Bed days entered.    Expected Discharge Plan: Skilled Nursing Facility Barriers to Discharge: Continued Medical Work up               Expected Discharge Plan and Services In-house Referral: NA Discharge Planning Services: CM Consult Post Acute Care Choice: Durable Medical Equipment, IP Rehab Living arrangements for the past 2 months: Single Family Home Expected Discharge Date: 03/01/24                                     Social Drivers of Health (SDOH) Interventions SDOH Screenings   Food Insecurity: No Food Insecurity (02/23/2024)  Housing: Low Risk (02/23/2024)  Transportation Needs: No Transportation Needs (02/23/2024)  Utilities: Not At Risk (02/23/2024)  Alcohol Screen: Low Risk (12/21/2022)  Depression (PHQ2-9): Low Risk (02/11/2024)  Financial Resource Strain: Low Risk (12/21/2022)  Physical Activity: Inactive (12/21/2022)  Social Connections: Socially Isolated (02/23/2024)  Stress: No Stress Concern Present (12/21/2022)  Tobacco Use: Low Risk (02/22/2024)  Health Literacy: Adequate Health Literacy (12/21/2022)    Readmission Risk Interventions    02/26/2024    2:28 PM 08/08/2021   10:00 AM  Readmission Risk Prevention Plan  Transportation Screening Complete Complete  PCP or Specialist Appt within 3-5 Days Complete   HRI or Home Care Consult  Complete   Social Work Consult for Recovery Care Planning/Counseling Complete   Palliative Care Screening Not Applicable   Medication Review Oceanographer) Complete Complete  PCP or Specialist appointment within 3-5 days of discharge  Complete  HRI or Home Care Consult  Complete  SW Recovery Care/Counseling Consult  Not Complete  Palliative Care Screening  Not Applicable  Skilled Nursing Facility  Not Applicable

## 2024-03-01 NOTE — Discharge Summary (Addendum)
 " Physician Discharge Summary   Patient: Charles Lloyd. MRN: [id number] DOB: March 14, 1940  Admit date:     02/22/2024  Discharge date: 03/01/2024  Discharge Physician: Delon Herald   PCP: Theophilus Andrews, Tully GRADE, MD   Recommendations at discharge:   You are being discharged to short-term rehabilitation at [facility] Continue Daptomycin /Cefepime  via PICC line through 04/13/24 (including after discharge from rehab) Recommend CBC/BMP in the next 48 hours Continue foley catheter; follow up with urology for voiding trial in the future Follow [facility] on 1/20 at 11AM Follow up with Dr. 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  He was found to have a 3.5 x 2.4 cm L psoas muscle abscess.  Neurosurgery recommended IR drainage and IV antibiotics.  CT-guided aspiration on 12/29.  Developed hospital-associated delirium requiring ICU transfer, now improved.  Will need STR, medically stable when bed is available.  Assessment and Plan:   *Addendum: Family visited [facility] and is refusing this bed as well as all other offers.  They request repeat CIR evaluation tomorrow and can provide 24/7 assistance at home after CIR discharge if approved.  Will hold discharge accordingly. Assessment & Plan Psoas abscess, left (HCC) L4-L5  spondylolisthesis s/p recent trans psoas lumbar interbody fusion 02/06/24 Presented with LLE weakness, pain, and recurrent falls Imaging with L psoas muscle abscess S/p IR CT guided aspiration of left psoas fluid collection, drain placed Continue broad-spectrum antibiotics - Zosyn , linezolid  No growth on fluid and blood cultures He continues to have significant LLE pain with standing but this does appear to be improving Pain control with APAP, gabapentin  ID consulted and recommends treatment as L4-5 site infection complicated by hardware He is recommended to change antibiotics to Dapto/Cefepime  and OPAT has been arranged Treatment for 6 weeks through 04/13/24 via PICC line He is medically stable for dc to STR when a bed is available Delirium Alcohol withdrawal syndrome, with delirium [facility]) Hospital delirium, alcohol withdrawal with history of dementia MRI brain unremarkable Off precedex  drip Taper off phenobarbital , will complete on 1/2 Continue delirium precaution, frequent reorientation CT head negative for acute findings This issue appears to have resolved and his son is concerned that it was a paradoxical response to Ativan ; added as allergy Paroxysmal A-fib (HCC) Resumed Eliquis  12/31 Continue flecainide  and Coreg  for rate/rhythm control Has prolonged QTc Attempt to avoid QT-prolonging medication when possible 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 STR  Based on lack of 24/7 care at home, he is being recommended for STR  Unable to get a bed at [facility] so expanded search He has been offered a bed at [facility] and insurance has aprroved Pressure injury Wound 02/24/24 1245 Pressure Injury Buttocks Mid;Lower Deep Tissue Pressure Injury - Purple or maroon localized area of discolored intact skin or blood-filled blister due to damage of underlying soft tissue from pressure and/or shear. (Active)     Wound 02/24/24 1245 Pressure Injury Scrotum Circumferential Deep Tissue Pressure Injury - Purple or maroon localized area of discolored intact skin or blood-filled blister due to damage of underlying soft tissue from pressure and/or shear. (Active)  POA Acute on chronic urinary retention Remote h/o prostatectomy Has had periodic issues with retention Has had indwelling foley in the past Having retention issues while here requiring I/O caths Will place foley Start tamsulosin  Will need urology follow up appointment with voiding trial in the future       Consultants: Neurosurgery IR PCCM PT OT ICM team   Procedures: CT-guided aspiration of L psoas fluid collection 12/29   Antibiotics: Linezolid  12/28- Zosyn  12/27- Vancomycin  x 1   Pain control - [place]  Controlled Substance Reporting System database was reviewed. and patient was instructed, not to drive, operate heavy machinery, perform activities at heights, swimming or participation in water activities or provide baby-sitting services while on Pain, Sleep and Anxiety Medications; until their outpatient Physician has advised to do so again. Also recommended to not to take more than prescribed Pain, Sleep and Anxiety Medications.   Disposition: Skilled nursing facility Diet recommendation:  Carb modified diet DISCHARGE MEDICATION: Allergies as of 03/01/2024       Reactions   Amlodipine   Nausea Only, Swelling, Other (See Comments)   Severe edema and chest pain- also   Ativan  [lorazepam ] Other (See Comments)   Made the patient fidgety and he hallucinates   Chlorhexidine  Gluconate Itching, Other (See Comments)   Only itching with wipes, not with soap        Medication List     PAUSE taking these medications    potassium chloride  SA 20 MEQ tablet Wait to take this until your doctor or other care provider tells you to start again. Commonly known as: Klor-Con  M20 Take 1 tablet (20 mEq total) by mouth daily.   torsemide  20 MG tablet Wait to take this until your doctor or other care provider tells you to start again. Commonly known as: DEMADEX  Take 1 tablet (20 mg total) by mouth 2 (two) times daily.       STOP taking these medications    oxyCODONE  5 MG immediate release tablet Commonly known as: Oxy IR/ROXICODONE        TAKE these medications    acetaminophen  325 MG tablet Commonly known as: TYLENOL  Take 2 tablets (650 mg total) by mouth every 6 (six) hours as needed for mild pain (pain score 1-3) or fever (or Fever >/= 101).   allopurinol  300 MG tablet Commonly known as: ZYLOPRIM  TAKE 1 TABLET BY MOUTH EVERY DAY   apixaban  5 MG Tabs tablet Commonly known as: Eliquis  Take 1 tablet (5 mg total) by mouth 2 (two) times daily.   atorvastatin  20 MG tablet Commonly known as: LIPITOR TAKE 1 TABLET DAILY (PLEASE SCHEDULE A PHYSICAL FOR MORE REFILLS) What changed: See the new instructions.   carvedilol  3.125 MG tablet Commonly known as: COREG  TAKE 1 TABLET BY  MOUTH TWICE A DAY What changed: when to take this   ceFEPime  IVPB Commonly known as: MAXIPIME  Inject 2 g into the vein every 8 (eight) hours. Indication:  Psoas abscess First Dose: Yes Last Day of Therapy:  04/10/24 Labs - Once weekly:  CBC/D and BMP, Labs - Once weekly: ESR and CRP   cyclobenzaprine  10 MG tablet Commonly known as: FLEXERIL  Take 1 tablet (10 mg total) by mouth 3 (three) times  daily as needed for muscle spasms.   daptomycin  IVPB Commonly known as: CUBICIN  Inject 600 mg into the vein daily. Indication:  Psoas abscess First Dose: Yes Last Day of Therapy:  04/10/24 Labs - Once weekly:  CBC/D, BMP, and CPK Labs - Once weekly: ESR and CRP   docusate sodium  100 MG capsule Commonly known as: Colace Take 1 capsule (100 mg total) by mouth daily as needed (please take daily while on narcotic pain medication).   flecainide  100 MG tablet Commonly known as: TAMBOCOR  TAKE 1 TABLET BY MOUTH TWICE A DAY What changed: when to take this   fluticasone  50 MCG/ACT nasal spray Commonly known as: FLONASE  Place 2 sprays into both nostrils daily. What changed:  when to take this reasons to take this   folic acid  1 MG tablet Commonly known as: FOLVITE  Take 1 tablet (1 mg total) by mouth daily. Start taking on: March 02, 2024   gabapentin  300 MG capsule Commonly known as: NEURONTIN  Take 1-2 capsules (300-600 mg total) by mouth See admin instructions. Take 300 mg by mouth in the morning & at noon and 600 mg at bedtime   hydrALAZINE  25 MG tablet Commonly known as: APRESOLINE  TAKE 1 TABLET (25 MG TOTAL) BY MOUTH 4 (FOUR) TIMES DAILY. What changed: when to take this   hydrocortisone  cream 1 % Apply topically 3 (three) times daily.   montelukast  10 MG tablet Commonly known as: SINGULAIR  Take 10 mg by mouth at bedtime.   Restasis  0.05 % ophthalmic emulsion Generic drug: cycloSPORINE  Place 1 drop into both eyes in the morning and at bedtime.   sodium chloride  flush 0.9 % Soln Commonly known as: NS 10-40 mLs by Intracatheter route as needed (flush).   sodium chloride  flush 0.9 % Soln Commonly known as: NS Inject 3 mLs into the vein every 12 (twelve) hours.   tamsulosin  0.4 MG Caps capsule Commonly known as: FLOMAX  Take 1 capsule (0.4 mg total) by mouth daily after supper.               Home Infusion Instuctions  (From admission, onward)            Start     Ordered   02/28/24 0000  Home infusion instructions       Question:  Instructions  Answer:  Flushing of vascular access device: 0.9% NaCl pre/post medication administration and prn patency; Heparin  100 u/ml, 5ml for implanted ports and Heparin  10u/ml, 5ml for all other central venous catheters.   02/28/24 1444            Follow-up Information     Theophilus Andrews, Tully GRADE, MD. Schedule an appointment as soon as possible for a visit.   Specialty: Internal Medicine Why: after discharge from rehab Contact information: [address] [place] [address] [phone]         Overton Constance DASEN, MD Follow up on 03/17/2024.   Specialty: Infectious Diseases Why: 11 AM Contact information: [address] [place] [address] [phone]  [facility]. Schedule an appointment as soon as possible for a visit.   Contact information: [address] [place] [address] [place]  [address] [phone]               Discharge Exam:    Subjective: Pain is better controlled, moving better.  B foot neuropathy is still present.   Objective: Vitals:   02/29/24 2006 03/01/24 0427  BP: (!) 161/76 130/64  Pulse: 100 82  Resp: 20 20  Temp: 98.8 F (37.1 C) 99.9 F (37.7 C)  SpO2: 99% 94%    Intake/Output Summary (Last 24 hours) at 03/01/2024 1204 Last data filed at 03/01/2024 0825 Gross per 24 hour  Intake 1120 ml  Output 1400 ml  Net -280 ml   Filed Weights   02/22/24 1238 02/24/24 1245  Weight: 79.4 kg 79.3 kg    Exam:  General:  Appears calm and comfortable and is in NAD Eyes:  normal lids, iris ENT:  grossly normal hearing, lips & tongue, mmm Cardiovascular:  RRR. No LE edema.  Respiratory:   CTA bilaterally with no wheezes/rales/rhonchi.  Normal respiratory effort. Abdomen:  soft, NT, ND Skin:  no rash or induration seen on limited exam Musculoskeletal:  grossly normal tone BUE/BLE, good ROM, no bony  abnormality Psychiatric:  grossly normal mood and affect, speech fluent and appropriate, AOx3 Neurologic:  CN 2-12 grossly intact, moves all extremities in coordinated fashion  Data Reviewed: I have reviewed the patient's lab results since admission.  Pertinent labs for today include:   None today    Condition at discharge: improving  The results of significant diagnostics from this hospitalization (including imaging, microbiology, ancillary and laboratory) are listed below for reference.   Imaging Studies: US  EKG SITE RITE Result Date: 02/28/2024 If Site Rite image not attached, placement could not be confirmed due to current cardiac rhythm.  CT GUIDED PERITONEAL/RETROPERITONEAL FLUID DRAIN BY PERC CATH Result Date: 02/24/2024 INDICATION: 84 year old with a postoperative fluid collection in the left psoas muscle. EXAM: CT-guided aspiration of left psoas fluid collection MEDICATIONS: Moderate sedation ANESTHESIA/SEDATION: Moderate (conscious) sedation was employed during this procedure. A total of Versed  1mg  and fentanyl  50 mcg was administered intravenously at the order of the provider performing the procedure. Total intra-service moderate sedation time: 15 minutes. Patient's level of consciousness and vital signs were monitored continuously by radiology nurse throughout the procedure under the supervision of the provider performing the procedure. COMPLICATIONS: None immediate. PROCEDURE: Informed written consent was obtained from the patient after a thorough discussion of the procedural risks, benefits and alternatives. All questions were addressed. A timeout was performed prior to the initiation of the procedure. Patient was placed prone. CT images through the lower abdomen were obtained. Left side of the back was prepped with Betadine and sterile field was created. Skin was anesthetized using 1% lidocaine . Using CT guidance, a Yueh catheter was directed into the left psoas muscle. Initially,  the needle was placed at the level of the L4 pedicle screw. However, no fluid could be aspirated. The Yueh catheter was directed more caudal into the anterior aspect of the left psoas muscle and 1 mL of bloody fluid was aspirated. Bandage placed over the puncture site. RADIATION DOSE REDUCTION: This exam was performed according to the departmental dose-optimization program which includes automated exposure control, adjustment of the mA and/or kV according to patient size and/or use of iterative reconstruction technique. FINDINGS: Bilateral interbody fusion at L4-L5. Needle was directed into the anterior aspect of  the left psoas muscle and 1 mL bloody fluid was removed. IMPRESSION: CT-guided aspiration of left psoas fluid collection. Fluid was sent for culture. Electronically Signed   By: Juliene Balder M.D.   On: 02/24/2024 22:02   MR BRAIN WO CONTRAST Result Date: 02/24/2024 EXAM: MRI BRAIN WITHOUT CONTRAST 02/24/2024 04:46:52 PM TECHNIQUE: Multiplanar multisequence MRI of the head/brain was performed without the administration of intravenous contrast. COMPARISON: CT head 02/22/2024 and earlier. CLINICAL HISTORY: Delirium. FINDINGS: BRAIN AND VENTRICLES: Mild generalized parenchymal volume loss. Mild prominence of extra spaces over the cerebral convexities, likely related to volume loss, without evidence of extra-axial fluid collection. White matter within normal limits for patient's age. No acute infarct. No intracranial hemorrhage. No mass. No midline shift. No hydrocephalus. The sella is unremarkable. Normal flow voids. ORBITS: Left lens replacement. SINUSES AND MASTOIDS: Mucosal thickening in the right maxillary sinus. Trace fluid in the mastoid air cells, right greater than left. BONES AND SOFT TISSUES: Normal marrow signal. No acute soft tissue abnormality. IMPRESSION: 1. No acute intracranial abnormality. Electronically signed by: Donnice Mania MD 02/24/2024 06:25 PM EST RP Workstation: [id number]   MR  Lumbar Spine W Wo Contrast Result Date: 02/22/2024 EXAM: MRI LUMBAR SPINE 02/22/2024 06:47:20 PM TECHNIQUE: Multiplanar multisequence MRI of the lumbar spine was performed without or with the administration of intravenous contrast. COMPARISON: MRI lumbar spine 10/30/2023 CLINICAL HISTORY: Low back pain, cauda equina syndrome suspected FINDINGS: SEGMENTATION: Transitional lumbosacral anatomy. The transitional lumbosacral vertebra is assumed to represent the S1 level. This conforms to the segmentation utilized on prior exams. BONES AND ALIGNMENT: Similar grade 1 retrolisthesis of L2 on L3 and improved grade 1 anterolisthesis of L4 on L5. Interval L4-L5 interbody fusion and PLIF. Hardware artifact limits assessment, but no obviouse bone marrow signal abnormality. SPINAL CORD: The conus terminates normally. SOFT TISSUES: New 3.5 x 2.4 cm fluid collection in the left psoas muscle at the L4-L5 level. More illdefined edema in the posterior paraspinal soft tissues with small fluid pockets is more typical of postoperative change. L1-L2 Disc height loss with degenerative endplate signal changes. Disc bulge with mild to moderate canal stenosis. Patent foramina. L2-L3 Disc bulging and endplate spurring with ligamentum flavum thickening, resulting in progressive severe canal stenosis. Bilateral facet arthropathy with progressive moderate to severe left foraminal stenosis. Similar moderate right foraminal stenosis. L3-L4 Broad disc bulge with ligamentum flavum thickening and bilateral facet arthropathy. Similar versus slightly progressive moderate to severe canal stenosis. Similar moderate left foraminal stenosis. L4-L5 Interbody fusion and PLIF.  Persistent severe canal stenosis. L5-S1 Mild disc bulge with similar borderline mild foraminal stenosis. S1-S2 Transitional anatomy. No spinal canal stenosis or neural foraminal narrowing. IMPRESSION: 1. New 3.5 x 2.4 cm fluid collection in the left psoas muscle at the L4-L5 level  with surrounding edema. The location is atypical for postoperative changes and is concerning for abscess. No obvious changes of discitis/osteomyelitis but hardware artifact limits assessment. Recommend correlation with infectious markers. 2. At L2-L3, progressive severe canal stenosis and moderate to severe left foraminal stenosis. Similar moderate right foraminal stenosis. 3. At L3-L4, similar versus slightly progressive moderate to severe canal stenosis. 4. Interval fusion at L4-L5 with improved alignment but persistent severe canal stenosis. 5. Transitional lumbosacral vertebra is assumed to represent the S1 level. This conforms to the segmentation utilized on prior exams. Electronically signed by: Gilmore Molt MD 02/22/2024 09:12 PM EST RP Workstation: [id number]   CT PELVIS WO CONTRAST Result Date: 02/22/2024 CLINICAL DATA:  Fall in shower. History of frequent  falls over the past 6-8 weeks. EXAM: CT PELVIS WITHOUT CONTRAST TECHNIQUE: Multidetector CT imaging of the pelvis was performed following the standard protocol without intravenous contrast. RADIATION DOSE REDUCTION: This exam was performed according to the departmental dose-optimization program which includes automated exposure control, adjustment of the mA and/or kV according to patient size and/or use of iterative reconstruction technique. COMPARISON:  Pelvic radiographs dated 02/22/2024. CT pelvis dated 10/30/2023. FINDINGS: Bones/Joint/Cartilage No acute fracture or dislocation. Femoral heads are seated within the acetabulum. 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 Bilateral greater trochanteric enthesopathy. The sacroiliac joints and pubic symphysis are anatomically aligned with degenerative changes. Partially visualized postoperative changes of the lower lumbar spine. Soft tissue and Muscles Soft tissue swelling along the left posterolateral abdominal  wall. No loculated fluid collection. Intrapelvic contents Prostate implant seeds. Descending and sigmoid colonic diverticulosis. Moderate vascular calcifications again noted. No enlarged lymph nodes identified in the field of view. IMPRESSION: 1. No acute osseous abnormality. 2. Soft tissue swelling along the left posterolateral abdominal wall. No fluid collection. 3. 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 Electronically Signed   By: Harrietta Sherry M.D.   On: 02/22/2024 16:13   CT Lumbar Spine Wo Contrast Result Date: 02/22/2024 EXAM: CT OF THE LUMBAR SPINE WITHOUT CONTRAST 02/22/2024 03:28:05 PM TECHNIQUE: CT of the lumbar spine was performed without the administration of intravenous contrast. Multiplanar reformatted images are provided for review. Automated exposure control, iterative reconstruction, and/or weight based adjustment of the mA/kV was utilized to reduce the radiation dose to as low as reasonably achievable. COMPARISON: MRI and CT lumbar spine 10/30/2023. CLINICAL HISTORY: Back trauma, no prior imaging (Age >= 16y). FINDINGS: BONES AND ALIGNMENT: Normal vertebral body heights. No evidence of compression fracture or displaced fracture in the lumbar spine. Redemonstrated transitional anatomy at the lumbosacral junction with a partially lumbarized S1 vertebral body and a near full size disc at the S1-S2 level. Interval PLIF at L4-L5 with bilateral pedicle screws and vertical interconnecting rods. Hardware is intact. Interbody spacer at L4-L5. There is no osseous fusion across the L4-L5 disc space on the current study. Diffuse osteopenia. Degenerative changes of the sacroiliac joints. Alignment: Trace degenerative retrolisthesis of L1 on L2 and L2 on L3. Similar grade 1 anterolisthesis of L4 on L5. DEGENERATIVE CHANGES: Disc space narrowing and vacuum disc phenomenon at multiple levels. Degenerative endplate osteophytes at multiple levels. Posterior osteophytes along  with facet arthrosis at L2-L3 contributing to moderate to severe spinal canal stenosis. Additional disc bulge and facet arthrosis at L3-L4 resulting in at least moderate spinal canal stenosis, although streak artifact slightly limits evaluation. Streak artifact limits evaluation of the spinal canal at L4-L5; there is likely residual moderate to severe spinal canal stenosis at this level. SOFT TISSUES: Atherosclerosis of the abdominal aorta and branch vessels. Additional prominent atherosclerosis within the pelvis. Cholecystectomy clips. Diverticulosis of the partially visualized sigmoid colon. IMPRESSION: 1. No evidence of acute traumatic injury. 2. Interval PLIF at L4-L5. Hardware is intact. No osseous fusion across the L4-L5 disc space. 3. 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 4. At least moderate spinal canal stenosis at L3-L4 due to disc bulge and facet arthrosis. 5. Likely residual moderate to severe spinal canal stenosis at L4-L5, evaluation limited by streak artifact. Electronically signed by: Donnice Mania MD 02/22/2024 04:04 PM EST RP Workstation: [id number]   DG Pelvis 1-2 Views Result Date: 02/22/2024 EXAM: 1  or 2 VIEW(S) XRAY OF THE PELVIS 02/22/2024 01:09:00 PM COMPARISON: None available. CLINICAL HISTORY: [id number] Fall [id number] FINDINGS: BONES AND JOINTS: No acute fracture. No malalignment. Lumbar spinal fusion hardware noted. Moderate bilateral hip degenerative changes. SOFT TISSUES: Surgical clips overlie the pelvis. Vascular calcifications noted. IMPRESSION: 1. No evidence of acute traumatic injury. Electronically signed by: Morgane Naveau MD 02/22/2024 01:32 PM EST RP Workstation: [id number]   DG Chest 1 View Result Date: 02/22/2024 EXAM: 1 VIEW(S) XRAY OF THE CHEST 02/22/2024 01:09:00 PM COMPARISON: 10/19/2022 CLINICAL HISTORY: Fall FINDINGS: LUNGS AND PLEURA: No focal pulmonary opacity. No pleural effusion. No pneumothorax. HEART  AND MEDIASTINUM: Calcified aorta. BONES AND SOFT TISSUES: Thoracic degenerative changes. No acute osseous abnormality. IMPRESSION: 1. No acute process. Electronically signed by: Morgane Naveau MD 02/22/2024 01:31 PM EST RP Workstation: [id number]   CT Cervical Spine Wo Contrast Result Date: 02/22/2024 EXAM: CT CERVICAL SPINE WITHOUT CONTRAST 02/22/2024 12:58:30 PM TECHNIQUE: CT of the cervical spine was performed without the administration of intravenous contrast. Multiplanar reformatted images are provided for review. Automated exposure control, iterative reconstruction, and/or weight based adjustment of the mA/kV was utilized to reduce the radiation dose to as low as reasonably achievable. COMPARISON: 10/30/2023 and 12/30/2020 CLINICAL HISTORY: Neck trauma (Age >= 65y) FINDINGS: BONES AND ALIGNMENT: No acute fracture or traumatic malalignment. There is straightening of the normal cervical lordosis. There is 3 mm anterolisthesis of C7 on T1 which is increased by approximately 1 mm since the 2022 study. Additional 4 mm anterolisthesis of T1 on T2 which is increased by approximately 1.5 x 2 mm since the 2022 study. DEGENERATIVE CHANGES: There is moderate to severe disc space narrowing at multiple levels in the cervical spine most pronounced at C5-C6 and C6-C7 overall similar to prior. There is severe disc space narrowing at T1-T2 which has increased from prior. Degenerative endplate osteophytes at multiple levels. Disc osteophyte complexes at multiple levels without high grade osseous spinal canal stenosis. There is facet arthrosis and uncovertebral hypertrophy at multiple levels. SOFT TISSUES: No prevertebral soft tissue swelling. IMPRESSION: 1. No acute cervical spine fracture. 2. 3 mm anterolisthesis of C7 on T1, increased from prior. 4 mm anterolisthesis of T1 on T2, increased since 2022. Findings likely related to increased degenerative changes. Electronically signed by: Donnice Mania MD 02/22/2024 01:21 PM  EST RP Workstation: [id number]   CT Head Wo Contrast Result Date: 02/22/2024 EXAM: CT HEAD WITHOUT CONTRAST 02/22/2024 12:58:30 PM TECHNIQUE: CT of the head was performed without the administration of intravenous contrast. Automated exposure control, iterative reconstruction, and/or weight based adjustment of the mA/kV was utilized to reduce the radiation dose to as low as reasonably achievable. COMPARISON: 10/30/2023 CLINICAL HISTORY: Head trauma, minor (Age >= 65y) FINDINGS: BRAIN AND VENTRICLES: No acute hemorrhage. No evidence of acute infarct. No hydrocephalus. No extra-axial collection. No mass effect or midline shift. Patchy and confluent decreased attenuation throughout the deep and periventricular white matter of the cerebral hemispheres bilaterally, compatible with chronic microvascular ischemic disease. Cerebral ventricle sizes concordant with degree of cerebral volume loss. Atherosclerotic calcifications within the cavernous internal carotid arteries. ORBITS: No acute abnormality. Left lens replacement. SINUSES: No acute abnormality. SOFT TISSUES AND SKULL: No acute soft tissue abnormality. No skull fracture. IMPRESSION: 1. No acute intracranial abnormality. Electronically signed by: Donnice Mania MD 02/22/2024 01:14 PM EST RP Workstation: [id number]   DG Lumbar Spine 2-3 Views Result Date: 02/06/2024 EXAM: 2 or 3 VIEW(S) XRAY OF THE LUMBAR SPINE 02/06/2024 03:41:00 PM COMPARISON: MRI 10/30/2023. CLINICAL HISTORY: [id number]  Elective surgery [id number] Elective surgery. FINDINGS: LUMBAR SPINE: BONES: Vertebral body heights are maintained. Status post surgical posterior fusion of L4-L5 with bilateral interpedicular screw placement and interbody fusion. Mild grade 1 retrolisthesis of L2-L3 is noted. DISCS AND DEGENERATIVE CHANGES: Severe degenerative disc disease is noted at all levels of the lumbar spine. SOFT TISSUES: No acute abnormality. IMPRESSION: 1. Status post posterior fusion of L4-5 with bilateral  pedicle screw fixation and interbody fusion. 2. Severe multilevel lumbar degenerative disc disease. 3. Mild grade 1 retrolisthesis at L2-3. Electronically signed by: Lynwood Seip MD 02/06/2024 06:27 PM EST RP Workstation: [id number]   DG Lumbar Spine 2-3 Views Result Date: 02/06/2024 CLINICAL DATA:  Elective surgery. EXAM: LUMBAR SPINE - 2-3 VIEW COMPARISON:  None Available. FINDINGS: Thirteen intraoperative fluoroscopic spot images provided. The total fluoroscopic time is 128.5 seconds with relative air Karma of 106.97 mGy. L4-L5 fusion noted. IMPRESSION: Intraoperative fluoroscopic spot images of the lumbar spine. Electronically Signed   By: Vanetta Chou M.D.   On: 02/06/2024 17:56   DG C-Arm 1-60 Min-No Report Result Date: 02/06/2024 Fluoroscopy was utilized by the requesting physician.  No radiographic interpretation.   DG C-Arm 1-60 Min-No Report Result Date: 02/06/2024 Fluoroscopy was utilized by the requesting physician.  No radiographic interpretation.   DG C-Arm 1-60 Min-No Report Result Date: 02/06/2024 Fluoroscopy was utilized by the requesting physician.  No radiographic interpretation.   DG O-ARM IMAGE ONLY/NO REPORT Result Date: 02/06/2024 There is no Radiologist interpretation  for this exam.   Microbiology: Results for orders placed or performed during the hospital encounter of 02/22/24  Blood culture (routine x 2)     Status: None   Collection Time: 02/22/24 11:24 PM   Specimen: BLOOD  Result Value Ref Range Status   Specimen Description   Final    BLOOD BLOOD RIGHT FOREARM Performed at [facility], [address], [place], [place] [address]    Special Requests   Final    BOTTLES DRAWN AEROBIC AND ANAEROBIC Blood Culture adequate volume Performed at [facility], [address] [place], [place] [address]    Culture   Final    NO GROWTH 5 DAYS Performed at [facility], [address] [place], [place] [address]     Report Status 02/28/2024 FINAL  Final  Blood culture (routine x 2)     Status: None   Collection Time: 02/22/24 11:35 PM   Specimen: BLOOD  Result Value Ref Range Status   Specimen Description   Final    BLOOD LEFT ANTECUBITAL Performed at [facility], [address], [place], [place] [address]    Special Requests   Final    BOTTLES DRAWN AEROBIC AND ANAEROBIC Blood Culture adequate volume Performed at [facility], [address], [place], [place] [address]    Culture   Final    NO GROWTH 5 DAYS Performed at [facility], [address] [place], [place] [address]    Report Status 02/28/2024 FINAL  Final  Aerobic/Anaerobic Culture w Gram Stain (surgical/deep wound)     Status: None   Collection Time: 02/24/24  4:37 PM   Specimen: Body Fluid  Result Value Ref Range Status   Specimen Description   Final    FLUID Performed at [facility], [address], [place], [place] [address]    Special Requests   Final    NONE Performed at [facility], [address] [place], [place] [address]    Gram Stain   Final    FEW WBC PRESENT, PREDOMINANTLY PMN NO ORGANISMS SEEN    Culture   Final    No growth aerobically or anaerobically. Performed at [facility], [address], [place], [place] [address]    Report Status 02/29/2024 FINAL  Final    Labs: CBC: Recent Labs  Lab 02/25/24 0341 02/26/24 0500 02/27/24 0332 02/28/24 0243 02/29/24 0300  WBC 3.5* 4.0 3.5* 3.6* 3.6*  NEUTROABS  --  2.6  --   --   --   HGB 9.2* 8.7* 8.9* 9.3* 9.1*  HCT 27.9* 25.9* 26.2* 27.8* 27.3*  MCV 90.0 89.3 87.0 86.9 88.1  PLT 211 204 204 197 183   Basic Metabolic Panel: Recent Labs  Lab 02/25/24 0341 02/26/24 0500 02/27/24 0332 02/28/24 0243 02/29/24 0300  NA 129* 130* 129* 127* 128*  K 4.0 3.5 3.6 3.6 3.6  CL 99 99 99 98 98  CO2 20* 20* 22 20* 22  GLUCOSE 102* 87 108* 105* 94  BUN 10 9 8  6*  6*  CREATININE 0.78 0.73 0.73 0.65 0.78  CALCIUM  8.5* 8.3* 8.6* 8.4* 8.6*  MG  --  1.7  --   --   --    Liver Function Tests: Recent Labs  Lab 02/24/24 0238  AST 27  ALT 11  ALKPHOS 104  BILITOT 1.1  PROT 5.0*  ALBUMIN  2.8*   CBG: Recent Labs  Lab 02/29/24 2001 02/29/24 2359 03/01/24 0426 03/01/24 0736 03/01/24 1133  GLUCAP 78 104* 94 89 91    Discharge time spent: greater than 30 minutes.  Signed: Delon Herald, MD [facility] 03/01/2024 "

## 2024-03-01 NOTE — Assessment & Plan Note (Deleted)
 Wound 02/24/24 1245 Pressure Injury Buttocks Mid;Lower Deep Tissue Pressure Injury - Purple or maroon localized area of discolored intact skin or blood-filled blister due to damage of underlying soft tissue from pressure and/or shear. (Active)     Wound 02/24/24 1245 Pressure Injury Scrotum Circumferential Deep Tissue Pressure Injury - Purple or maroon localized area of discolored intact skin or blood-filled blister due to damage of underlying soft tissue from pressure and/or shear. (Active)  POA

## 2024-03-01 NOTE — Assessment & Plan Note (Deleted)
 A1c was 5.9% earlier this month, good control Cover with very sensitive-scale SSI

## 2024-03-01 NOTE — Progress Notes (Signed)
 Mobility Specialist - Progress Note   03/01/24 0825  Mobility  Activity Pivoted/transferred from bed to chair  Level of Assistance Moderate assist, patient does 50-74%  Assistive Device Front wheel walker  Distance Ambulated (ft) 3 ft  Range of Motion/Exercises Active  Activity Response Tolerated well  Mobility visit 1 Mobility  Mobility Specialist Start Time (ACUTE ONLY) 0815  Mobility Specialist Stop Time (ACUTE ONLY) 0825  Mobility Specialist Time Calculation (min) (ACUTE ONLY) 10 min   Pt was found in bed and agreeable to mobilize. NT in room for safety. Pt grew fatigued with transfer. At EOS was left on recliner chair with all needs met. Call bell in reach. Chair alarm on.  Erminio Leos,  Mobility Specialist Can be reached via Secure Chat

## 2024-03-01 NOTE — Assessment & Plan Note (Signed)
 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

## 2024-03-01 NOTE — Assessment & Plan Note (Signed)
 Hospital delirium, alcohol withdrawal with history of dementia MRI brain unremarkable Off precedex  drip Taper off phenobarbital , will complete on 1/2 Continue delirium precaution, frequent reorientation CT head negative for acute findings This issue appears to have resolved and his son is concerned that it was a paradoxical response to Ativan ; added as allergy

## 2024-03-01 NOTE — Assessment & Plan Note (Deleted)
 Compensated, no exacerbation Hold torsemide 

## 2024-03-01 NOTE — Assessment & Plan Note (Signed)
"  Continue atorvastatin   "

## 2024-03-01 NOTE — Assessment & Plan Note (Signed)
 Patient reports multiple falls in last few weeks that he attributes to leg weakness, pain, and tremors Fall precautions PT/ OT consulted He is being considered for CIR vs. STR  Based on lack of 24/7 care at home, he is being recommended for STR  Unable to get a bed at [facility] so expanded search He has been offered a bed at [facility] and insurance has aprroved

## 2024-03-01 NOTE — Assessment & Plan Note (Signed)
 Compensated, no exacerbation Hold torsemide  for now, resume when needed

## 2024-03-01 NOTE — Assessment & Plan Note (Signed)
 Resumed Eliquis  12/31 Continue flecainide  and Coreg  for rate/rhythm control Has prolonged QTc Attempt to avoid QT-prolonging medication when possible

## 2024-03-01 NOTE — Assessment & Plan Note (Deleted)
 Patient reports multiple falls in last few weeks that he attributes to leg weakness, pain, and tremors Fall precautions PT/ OT consulted He is being considered for CIR vs. STR  Based on lack of 24/7 care at home, he is being recommended for STR  Unable to get a bed at [facility] so expanding search today to [employer], [facility]

## 2024-03-01 NOTE — Assessment & Plan Note (Deleted)
 No exacerbation Continue Singulair  and as-needed albuterol 

## 2024-03-01 NOTE — Assessment & Plan Note (Signed)
 Wound 02/24/24 1245 Pressure Injury Buttocks Mid;Lower Deep Tissue Pressure Injury - Purple or maroon localized area of discolored intact skin or blood-filled blister due to damage of underlying soft tissue from pressure and/or shear. (Active)     Wound 02/24/24 1245 Pressure Injury Scrotum Circumferential Deep Tissue Pressure Injury - Purple or maroon localized area of discolored intact skin or blood-filled blister due to damage of underlying soft tissue from pressure and/or shear. (Active)  POA

## 2024-03-01 NOTE — Assessment & Plan Note (Signed)
-  Continue Coreg and hydralazine °

## 2024-03-01 NOTE — Assessment & Plan Note (Signed)
 A1c was 5.9% earlier this month, good control Diet controlled Will not continue insulin  at time of discharge

## 2024-03-01 NOTE — Assessment & Plan Note (Deleted)
 Remote h/o prostatectomy Has had periodic issues with retention Has had indwelling foley in the past Having retention issues while here requiring I/O caths Will place foley Start tamsulosin  Will need urology follow up appointment with voiding trial in the future

## 2024-03-01 NOTE — Assessment & Plan Note (Deleted)
"  Continue atorvastatin   "

## 2024-03-01 NOTE — Assessment & Plan Note (Signed)
 Remote h/o prostatectomy Has had periodic issues with retention Has had indwelling foley in the past Having retention issues while here requiring I/O caths Will place foley Start tamsulosin  Will need urology follow up appointment with voiding trial in the future

## 2024-03-01 NOTE — Assessment & Plan Note (Signed)
 No exacerbation Continue Singulair  and as-needed albuterol 

## 2024-03-02 ENCOUNTER — Ambulatory Visit: Admitting: Internal Medicine

## 2024-03-02 ENCOUNTER — Other Ambulatory Visit (HOSPITAL_COMMUNITY): Payer: Self-pay

## 2024-03-02 ENCOUNTER — Other Ambulatory Visit: Payer: Self-pay | Admitting: Internal Medicine

## 2024-03-02 DIAGNOSIS — K6812 Psoas muscle abscess: Secondary | ICD-10-CM | POA: Diagnosis not present

## 2024-03-02 LAB — CBC
HCT: 28.8 % — ABNORMAL LOW (ref 39.0–52.0)
Hemoglobin: 9.7 g/dL — ABNORMAL LOW (ref 13.0–17.0)
MCH: 29.7 pg (ref 26.0–34.0)
MCHC: 33.7 g/dL (ref 30.0–36.0)
MCV: 88.1 fL (ref 80.0–100.0)
Platelets: 104 K/uL — ABNORMAL LOW (ref 150–400)
RBC: 3.27 MIL/uL — ABNORMAL LOW (ref 4.22–5.81)
RDW: 17 % — ABNORMAL HIGH (ref 11.5–15.5)
WBC: 4.8 K/uL (ref 4.0–10.5)
nRBC: 0 % (ref 0.0–0.2)

## 2024-03-02 LAB — BASIC METABOLIC PANEL WITH GFR
Anion gap: 11 (ref 5–15)
BUN: 11 mg/dL (ref 8–23)
CO2: 19 mmol/L — ABNORMAL LOW (ref 22–32)
Calcium: 8.6 mg/dL — ABNORMAL LOW (ref 8.9–10.3)
Chloride: 97 mmol/L — ABNORMAL LOW (ref 98–111)
Creatinine, Ser: 0.69 mg/dL (ref 0.61–1.24)
GFR, Estimated: >60 mL/min
Glucose, Bld: 128 mg/dL — ABNORMAL HIGH (ref 70–99)
Potassium: 3.3 mmol/L — ABNORMAL LOW (ref 3.5–5.1)
Sodium: 128 mmol/L — ABNORMAL LOW (ref 135–145)

## 2024-03-02 LAB — GLUCOSE, CAPILLARY
Glucose-Capillary: 76 mg/dL (ref 70–99)
Glucose-Capillary: 110 mg/dL — ABNORMAL HIGH (ref 70–99)
Glucose-Capillary: 124 mg/dL — ABNORMAL HIGH (ref 70–99)
Glucose-Capillary: 146 mg/dL — ABNORMAL HIGH (ref 70–99)

## 2024-03-02 NOTE — PMR Pre-admission (Shared)
 PMR Admission Coordinator Pre-Admission Assessment  Patient: Charles Lloyd. is an 84 y.o., male MRN: [id number] DOB: [birth year] Height: 5' 8 (172.7 cm) Weight: 79.3 kg  Insurance Information HMO: ***    PPO: ***     PCP: ***     IPA: ***     80/20: ***     OTHER: *** PRIMARY: [employer] medicare  Policy#: [id number]      Subscriber: patient CM Name: ***      Phone#: ***     Fax#: *** Pre-Cert#: ***      Employer: *** Benefits:  Phone #: ***     Name: *** Charles Lloyd. Date: ***     Deduct: ***      Out of Pocket Max: ***      Life Max: *** CIR: ***      SNF: *** Outpatient: ***     Co-Pay: *** Home Health: ***      Co-Pay: *** DME: ***     Co-Pay: *** Providers: *** SECONDARY: ***      Policy#: ***     Phone#: ***  Financial Counselor: ***      Phone#: ***  The Data Collection Information Summary for patients in Inpatient Rehabilitation Facilities with attached Privacy Act Statement-Health Care Records was provided and verbally reviewed with: Patient and Family  Emergency Contact Information Contact Information     Name Relation Home Work Mobile   Charles Lloyd Son   [phone]   Charles Lloyd [phone]        Other Contacts   None on File     Current Medical History  Patient Admitting Diagnosis: psoas Abscess History of Present Illness: 84 year old male presented to [facility] emergency department on 02/22/2024 for leg pain and weakness, with family reporting multiple falls over the past 6 to 8 months.  MRI lumbar spine revealed a new 3.5 x 2.4 cm fluid collection in the left psoas muscle concerning for abscess.  Neurosurgery was consulted at that time and recommended IV antibiotics and possible aspiration of abscess. PMH: hypertension, hyperlipidemia, type 2 diabetes, atrial fibrillation on anticoagulation, s/p recent L4-5 trans psoas lumbar interbody fusion left-sided approach on 02/06/2024, CHF,gout,CAD, TKA with infection, neuropathy     Patient's  medical record from Charles Lloyd has been reviewed by the rehabilitation admission coordinator and physician.  Past Medical History  Past Medical History:  Diagnosis Date   Acute tracheobronchitis 01/05/2019   B12 deficiency    Basal cell carcinoma (BCC) of left side of nose 01/28/2018   Cancer (HCC)    Cardiomyopathy (HCC) 03/10/2018   With chronic atrial fibrillation   CHF (congestive heart failure) (HCC) 04/16/2013   Functional class II, ejection fraction 35-40%  Formatting of this note might be different from the original. Functional class II, ejection fraction 35-40%   Chronic anticoagulation    Chronic diastolic (congestive) heart failure (HCC) 04/16/2013   Functional class II, ejection fraction 35-40%  Last Assessment & Plan:  Clinically stable Volume well controlled meds reviewed   Chronic diastolic CHF (congestive heart failure) (HCC) 04/16/2013   Functional class II, ejection fraction 35-40%  Last Assessment & Plan:  Clinically stable Volume well controlled meds reviewed   Colon polyps 04/16/2013   Coronary artery disease involving native coronary artery of native heart without angina pectoris 04/05/2022   Myoview  05/25/2019: EF 60, normal perfusion, low risk CCTA (AF protocol) 04/25/20: CAC score 4242 (99th percentile)   Diabetic neuropathy (HCC)    Difficult  intubation    pt reports difficult intubation during surgery in [place] in 2015   Dysrhythmia    A.Fib   Eosinophil count raised 02/17/2019   Essential hypertension 01/06/2010   Last Assessment & Plan:  Well controlled Continue med management   Gout    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  Steroid usage was discontinued several months ago.  Follow up with Dermatology as planned.   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   Last Assessment & Plan:  Id following ?ongoing abx managementy reported Request records   Infection of prosthetic right knee joint    Insomnia 03/04/2012   Grief with loss of wife 02/20/12  Last Assessment & Plan:  Improved sx  contineu xanax prn Se discussed   Lesion of liver 04/20/2019   -On cardiac CT 02/2019   Mild reactive airways disease    Neoplasm of prostate 04/16/2013   Neoplasm of prostate, 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 Transthoracic Echocardiogram-03/10/2013-[facility]: Normal left ventricular wall thickness and cavity size.  Global left ventricular systolic function is moderately reduced.  The estimated ejection fraction is 35-40%.  The left atrium is moderately enlarged.  The right atrium is mildly enlarged.  No significant valvular    Overweight (BMI 25.0-29.9) 10/22/2016   Persistent atrial fibrillation (HCC) 04/16/2013   Last Assessment & Plan:  Rate controlled Continue med management eliquis  for stroke preventino  Formatting of this note might be different from the original.  Drug  HX Current Rx Pre-ABL inefficacy Pre-ABL intolerant Post-ABL inefficacy Post-ABL intolerant max dose/24h M/Y end comments  sotalol                  dofetilide                  flecainide                   propafenone                  am   S/P TKR (total knee replacement), bilateral    Secondary hypercoagulable state 04/09/2019   Tinea cruris 04/16/2013   Type 2 diabetes mellitus with diabetic  polyneuropathy, without long-term current use of insulin  (HCC) 04/16/2013   Last Assessment & Plan:  Labs today Pt reports well controlled on ambulatory monitoring   Vitamin D  deficiency 07/25/2018    Has the patient had major surgery during 100 days prior to admission? Yes  Family History   family history includes Cancer in his father and mother; Depression in his father and mother; Early death in his father and mother.  Current Medications Current Medications[1]  Patients Current Diet:  Diet Order             Diet Carb Modified           Diet regular Room  service appropriate? No; Fluid consistency: Thin  Diet effective now                   Precautions / Restrictions Precautions Precautions: Fall Precaution Booklet Issued: Yes (comment) Precaution/Restrictions Comments: trunk flexion at baseline Restrictions Weight Bearing Restrictions Per Provider Order: No   Has the patient had 2 or more falls or a fall with injury in the past year? Yes  Prior Activity Level    Prior Functional Level Self Care: Did the patient need help bathing, dressing, using the toilet or eating? Independent  Indoor Mobility: Did the patient need assistance with walking from room to room (with or without device)? Independent  Stairs: Did the patient need assistance with internal or external stairs (with or without device)? Independent  Functional Cognition: Did the patient need help planning regular tasks such as shopping or remembering to take medications? Independent  Patient Information Are you of Hispanic, Latino/a,or Spanish origin?: A. No, not of Hispanic, Latino/a, or Spanish origin What is your race?: A. White Do you need or want an interpreter to communicate with a doctor or health care staff?: 0. No  Patient's Response To:  Health Literacy and Transportation Is the patient able to respond to health literacy and transportation needs?: No Health Literacy - How often do you need  to have someone help you when you read instructions, pamphlets, or other written material from your doctor or pharmacy?: Patient unable to respond  Home Assistive Devices / Equipment Home Equipment: Rollator (4 wheels), Cane - single point, Shower seat  Prior Device Use: Indicate devices/aids used by the patient prior to current illness, exacerbation or injury? Walker  Current Functional Level Cognition  Orientation Level: Oriented X4    Extremity Assessment (includes Sensation/Coordination)  Upper Extremity Assessment: Generalized weakness, Right hand dominant RUE Deficits / Details: AROM WFL. Gross strength 4/5 LUE Deficits / Details: AROM WFL. Gross strength 4/5  Lower Extremity Assessment: Defer to PT evaluation RLE Deficits / Details: He reported increased pain which has been ongoing for ~8 wks. He also reported tingling, vibrating sensation, and intermittent sharp pain. LLE Deficits / Details: He reported increased pain which has been ongoing for ~8 wks. He also reported tingling, vibrating sensation, and intermittent sharp pain.    ADLs  Overall ADL's : Needs assistance/impaired Eating/Feeding: Independent, Sitting Grooming: Set up, Sitting Upper Body Bathing: Set up, Supervision/ safety, Sitting Lower Body Bathing: Moderate assistance, Sitting/lateral leans Upper Body Dressing : Set up, Supervision/safety, Sitting Lower Body Dressing: Moderate assistance, Maximal assistance, Sitting/lateral leans, Sit to/from stand Lower Body Dressing Details (indicate cue type and reason): initiated training in sock aide, both son nor patient had never been introduced despite difficulty with sock mngt rec HH, patient did well with steps of use and to don Bly with min A and cues Toilet Transfer: Moderate assistance, +2 for physical assistance, Stand-pivot, BSC/3in1, Rolling walker (2 wheels) Toilet Transfer Details (indicate cue type and reason): had BM earlier this am Toileting- Clothing  Manipulation and Hygiene: Moderate assistance Toileting - Clothing Manipulation Details (indicate cue type and reason): has Foley currently for urine mngt Functional mobility during ADLs: Moderate assistance, Rolling walker (2 wheels) General ADL Comments: open to novel sock aide training with son and patient with excellent success    Mobility  Overal bed mobility:  (patient already up in recliner and remained post session) Bed Mobility: Sit to Supine Rolling: Mod assist, Used rails Sidelying to sit: Max assist, +2 for physical assistance, +2  for safety/equipment Sit to supine: Max assist General bed mobility comments: pt was received seated in the recliner and returned to recliner at end of session    Transfers  Overall transfer level: Needs assistance Equipment used: Rolling walker (2 wheels) Transfers: Sit to/from Stand Sit to Stand: Mod assist Bed to/from chair/wheelchair/BSC transfer type:: Step pivot Stand pivot transfers: Mod assist, +2 physical assistance, +2 safety/equipment Step pivot transfers: Mod assist, +2 physical assistance, +2 safety/equipment General transfer comment: cues for proper UE and AD placement, pt exhibited strong posterior lean with initial standing and required cues for anterior weight shift, wide BOS and proper UE placement, pt is limited with trunk extension at baseline and compounded by recent surgery.    Ambulation / Gait / Stairs / Wheelchair Mobility  Ambulation/Gait Ambulation/Gait assistance: Editor, Commissioning (Feet): 30 Feet Assistive device: Rolling walker (2 wheels) Gait Pattern/deviations: Step-through pattern, Decreased stride length, Trunk flexed General Gait Details: pt demonstrates flexed posture and is unable to modifiy trunk or cervical for improved visual scanning, pt required cues, pt repoted B LE instabiltiy noted to have decreased stide length and stated that his L LE was going to give out on him. pt also indicated fear of  falling with hx of frequent falls, pt required progressingly increased assist with fatigue- CGA  initally then min A for balance and Rw management and then total for fall prevention with pt guided to PT's LE and nursing staff provided recliner. pt reported he was just tired and did not want to fall. family supporting pt with balance at end of gait and encouraging Gait velocity: Decreased    Posture / Balance Balance Overall balance assessment: History of Falls, Needs assistance Sitting-balance support: Feet supported Sitting balance-Leahy Scale: Fair Standing balance support: Bilateral upper extremity supported, During functional activity, Reliant on assistive device for balance Standing balance-Leahy Scale: Poor Standing balance comment: cues for upright head/posture, min A and B UE support at RW    Special considerations/life events  Skin ***   Previous Home Environment (from acute therapy documentation) Living Arrangements: Alone Available Help at Discharge: Family, Available PRN/intermittently Type of Home: House (townhouse) Home Layout: Two level, Bed/bath upstairs, 1/2 bath on main level Alternate Level Stairs-Number of Steps: stair lift, all bedrooms and the full bathroom are upstairs Home Access: Stairs to enter Entrance Stairs-Number of Steps: 1 Bathroom Shower/Tub: Health Visitor: Handicapped height Home Care Services: Yes Type of Home Care Services: Home PT Additional Comments: Pt had occasional difficulty with providing detailed reports, therefore info reported here may need to be verified.  Discharge Living Setting Plans for Discharge Living Setting: Patient's home Type of Home at Discharge: House Discharge Home Layout: Two level, 1/2 bath on main level Alternate Level Stairs-Number of Steps: flight Discharge Home Access: Stairs to enter Entrance Stairs-Number of Steps: 1 Discharge Bathroom Shower/Tub: Walk-in shower Discharge Bathroom Toilet:  Handicapped height Does the patient have any problems obtaining your medications?: No  Social/Family/Support Systems Anticipated Caregiver: Charles Lloyd, Charles Lloyd Anticipated Caregiver's Contact Information: [phone] Caregiver Availability: Intermittent Discharge Plan Discussed with Primary Caregiver: Yes Is Caregiver In Agreement with Plan?: Yes Does Caregiver/Family have Issues with Lodging/Transportation while Pt is in Rehab?: No  Goals Patient/Family Goal for Rehab: mod I PT, OT Expected length of stay: 7-10 days Pt/Family Agrees to Admission and willing to participate: Yes Program Orientation Provided & Reviewed with Pt/Caregiver Including Roles  & Responsibilities: Yes  Barriers to Discharge: Insurance for SNF coverage  Decrease burden of Care  through IP rehab admission: Othern/a  Possible need for SNF placement upon discharge: not anticipated  Patient Condition: I have reviewed medical records from [facility], spoken with TOC, and patient and son. I discussed via phone for inpatient rehabilitation assessment.  Patient will benefit from ongoing PT and OT, can actively participate in 3 hours of therapy a day 5 days of the week, and can make measurable gains during the admission.  Patient will also benefit from the coordinated team approach during an Inpatient Acute Rehabilitation admission.  The patient will receive intensive therapy as well as Rehabilitation physician, nursing, social worker, and care management interventions.  Due to safety, skin/wound care, disease management, medication administration, pain management, and patient education the patient requires 24 hour a day rehabilitation nursing.  The patient is currently *** with mobility and basic ADLs.  Discharge setting and therapy post discharge at [facility] IP discharge location:[id number]} is anticipated.  Patient has agreed to participate in the Acute Inpatient Rehabilitation Program and will admit {Time;  today/tomorrow:10263}.  Preadmission Screen Completed By:  COSMO VEAR PLATTER, 03/02/2024 4:37 PM ______________________________________________________________________   Discussed status with Dr. PIERRETTE on *** at *** and received approval for admission today.  Admission Coordinator:  COSMO VEAR PLATTER, time ***/Date ***   Assessment/Plan: Diagnosis: *** Does the need for close, 24 hr/day Medical supervision in concert with the patient's rehab needs make it unreasonable for this patient to be served in a less intensive setting? {yes_no_potentially:3041433} Co-Morbidities requiring supervision/potential complications: *** Due to {due un:6958565}, does the patient require 24 hr/day rehab nursing? {yes_no_potentially:3041433} Does the patient require coordinated care of a physician, rehab nurse, PT, OT, and SLP to address physical and functional deficits in the context of the above medical diagnosis(es)? {yes_no_potentially:3041433} Addressing deficits in the following areas: {deficits:3041436} Can the patient actively participate in an intensive therapy program of at least 3 hrs of therapy 5 days a week? {yes_no_potentially:3041433} The potential for patient to make measurable gains while on inpatient rehab is {potential:3041437} Anticipated functional outcomes upon discharge from inpatient rehab: {functional outcomes:304600100} PT, {functional outcomes:304600100} OT, {functional outcomes:304600100} SLP Estimated rehab length of stay to reach the above functional goals is: *** Anticipated discharge destination: {anticipated dc setting:21604} 10. Overall Rehab/Functional Prognosis: {potential:3041437}   MD Signature: ***    [1]  Current Facility-Administered Medications:    acetaminophen  (TYLENOL ) tablet 650 mg, 650 mg, Oral, Q6H PRN, 650 mg at 03/02/24 0823 **OR** acetaminophen  (TYLENOL ) suppository 650 mg, 650 mg, Rectal, Q6H PRN, Opyd, Timothy S, MD   apixaban  (ELIQUIS ) tablet 5 mg, 5 mg,  Oral, BID, Sreeram, Narendranath, MD, 5 mg at 03/02/24 9175   atorvastatin  (LIPITOR) tablet 20 mg, 20 mg, Oral, QHS, Crozier, Peyton, PA-C, 20 mg at 03/01/24 2110   bisacodyl  (DULCOLAX) EC tablet 5 mg, 5 mg, Oral, Daily PRN, Opyd, Timothy S, MD   carvedilol  (COREG ) tablet 3.125 mg, 3.125 mg, Oral, BID, Crozier, Peyton, PA-C, 3.125 mg at 03/02/24 9176   ceFEPIme  (MAXIPIME ) 2 g in sodium chloride  0.9 % 100 mL IVPB, 2 g, Intravenous, Q8H, Vu, Trung T, MD, Last Rate: 200 mL/hr at 03/02/24 1453, 2 g at 03/02/24 1453   Chlorhexidine  Gluconate Cloth 2 % PADS 6 each, 6 each, Topical, Daily, Barbarann Nest, MD, 6 each at 03/02/24 0825   cyclobenzaprine  (FLEXERIL ) tablet 10 mg, 10 mg, Oral, TID PRN, Crozier, Peyton, PA-C, 10 mg at 02/28/24 2208   cycloSPORINE  (RESTASIS ) 0.05 % ophthalmic emulsion 1 drop, 1 drop, Both Eyes, Daily, Opyd, Evalene RAMAN, MD, 1  drop at 03/02/24 0826   DAPTOmycin  (CUBICIN ) 600 mg in sodium chloride  0.9 % IVPB, 8 mg/kg, Intravenous, Q1400, Vu, Trung T, MD, Last Rate: 124 mL/hr at 03/01/24 1335, 600 mg at 03/01/24 1335   flecainide  (TAMBOCOR ) tablet 100 mg, 100 mg, Oral, BID, Crozier, Peyton, PA-C, 100 mg at 03/02/24 9173   folic acid  (FOLVITE ) tablet 1 mg, 1 mg, Oral, Daily, Crozier, Peyton, PA-C, 1 mg at 03/02/24 9176   gabapentin  (NEURONTIN ) capsule 300 mg, 300 mg, Oral, Daily, Crozier, Peyton, PA-C, 300 mg at 03/02/24 9175   gabapentin  (NEURONTIN ) capsule 600 mg, 600 mg, Oral, QPM, Crozier, Peyton, PA-C, 600 mg at 03/01/24 2110   hydrALAZINE  (APRESOLINE ) tablet 25 mg, 25 mg, Oral, BID, Crozier, Peyton, PA-C, 25 mg at 03/02/24 9175   hydrocortisone  cream 1 %, , Topical, TID, Jillian Buttery, MD, Given at 02/29/24 2135   insulin  aspart (novoLOG ) injection 0-6 Units, 0-6 Units, Subcutaneous, TID AC & HS, Barbarann Nest, MD   montelukast  (SINGULAIR ) tablet 10 mg, 10 mg, Oral, QHS, Crozier, Peyton, PA-C, 10 mg at 03/01/24 2110   Oral care mouth rinse, 15 mL, Mouth Rinse, PRN, Barbarann Nest, MD   senna-docusate (Senokot-S) tablet 1 tablet, 1 tablet, Oral, QHS PRN, Opyd, Evalene RAMAN, MD   sodium chloride  flush (NS) 0.9 % injection 10-40 mL, 10-40 mL, Intracatheter, PRN, Opyd, Timothy S, MD   sodium chloride  flush (NS) 0.9 % injection 10-40 mL, 10-40 mL, Intracatheter, PRN, Barbarann Nest, MD   sodium chloride  flush (NS) 0.9 % injection 3 mL, 3 mL, Intravenous, Q12H, Opyd, Timothy S, MD, 3 mL at 02/29/24 0915   tamsulosin  (FLOMAX ) capsule 0.4 mg, 0.4 mg, Oral, QPC supper, Barbarann Nest, MD, 0.4 mg at 03/01/24 1741   thiamine  (VITAMIN B1) tablet 100 mg, 100 mg, Oral, Daily, 100 mg at 03/02/24 0823 **OR** thiamine  (VITAMIN B1) injection 100 mg, 100 mg, Intravenous, Daily, Adhikari, Amrit, MD, 100 mg at 02/25/24 [phone]

## 2024-03-02 NOTE — Assessment & Plan Note (Signed)
 Compensated, no exacerbation Hold torsemide  for now, resume when needed

## 2024-03-02 NOTE — Assessment & Plan Note (Deleted)
 Remote h/o prostatectomy Has had periodic issues with retention Has had indwelling foley in the past Having retention issues while here requiring I/O caths Will place foley Start tamsulosin  Will need urology follow up appointment with voiding trial in the future

## 2024-03-02 NOTE — Progress Notes (Signed)
 Occupational Therapy Treatment Patient Details Name: Charles Lloyd. MRN: [id number] DOB: 1940/09/06 Today's Date: 03/02/2024   History of present illness Charles Lloyd. is a 84 y.o. male who presented to the emergency department on 02/22/2024 for leg pain and weakness, with family reporting multiple falls over the past 6 to 8 months.  MRI lumbar spine revealed a new 3.5 x 2.4 cm fluid collection in the left psoas muscle concerning for abscess.  Neurosurgery was consulted at that time and recommended IV antibiotics and possible aspiration of abscess.  with significant PMH of hypertension, hyperlipidemia, type 2 diabetes, atrial fibrillation on anticoagulation, s/p recent L4-5 trans psoas lumbar interbody fusion left-sided approach on 02/06/2024, CHF,gout,CAD, TKA with infection, neuropathy   OT comments  Patient seen for skilled OT session this am. Son Charles Lloyd present and actively participating in education and training. Patient open to and tolerated all therapy presented with no pain and VSS. OT introduced sock aide for LB dressing and patient with + success in use due to decreased functional reach. Standing tolerance with RW progression ongoing with postural retraining, UE theract and UE tband therex 3 sets and HEP issued for between visits.  Patient requires continued Acute care hospital level OT services to progress safety and functional performance and allow for discharge. Patient will benefit from intensive inpatient follow-up therapy, >3 hours/day.         If plan is discharge home, recommend the following:  A lot of help with walking and/or transfers;A lot of help with bathing/dressing/bathroom;Help with stairs or ramp for entrance;Assistance with cooking/housework;Assist for transportation;Direct supervision/assist for medications management   Equipment Recommendations  None recommended by OT (will benefit from sock aide post rehab setting)       Precautions / Restrictions  Precautions Precautions: Fall Recall of Precautions/Restrictions: Impaired Precaution/Restrictions Comments: body mechanics Restrictions Weight Bearing Restrictions Per Provider Order: No       Mobility Bed Mobility Overal bed mobility:  (patient already up in recliner and remained post session)                  Transfers Overall transfer level: Needs assistance Equipment used: Rolling walker (2 wheels) Transfers: Sit to/from Stand, Bed to chair/wheelchair/BSC Sit to Stand: Min assist, Mod assist, From elevated surface     Step pivot transfers: Mod assist, +2 physical assistance, +2 safety/equipment     General transfer comment: cues for hand and foot placement, steps of transfers and management of iv and Foley lines/tubes     Balance Overall balance assessment: History of Falls, Needs assistance Sitting-balance support: Feet supported Sitting balance-Leahy Scale: Fair     Standing balance support: Bilateral upper extremity supported, During functional activity, Reliant on assistive device for balance Standing balance-Leahy Scale: Poor Standing balance comment: cues for upright head/posture                           ADL either performed or assessed with clinical judgement   ADL Overall ADL's : Needs assistance/impaired Eating/Feeding: Independent;Sitting   Grooming: Set up;Sitting   Upper Body Bathing: Set up;Supervision/ safety;Sitting   Lower Body Bathing: Moderate assistance;Sitting/lateral leans   Upper Body Dressing : Set up;Supervision/safety;Sitting   Lower Body Dressing: Moderate assistance;Maximal assistance;Sitting/lateral leans;Sit to/from stand Lower Body Dressing Details (indicate cue type and reason): initiated training in sock aide, both son nor patient had never been introduced despite difficulty with sock mngt rec HH, patient did well with steps of use  and to don Bly with min A and cues   Toilet Transfer Details (indicate cue  type and reason): had BM earlier this am Toileting- Clothing Manipulation and Hygiene: Moderate assistance Toileting - Clothing Manipulation Details (indicate cue type and reason): has Foley currently for urine mngt     Functional mobility during ADLs: Moderate assistance;Rolling walker (2 wheels) General ADL Comments: open to novel sock aide training with son and patient with excellent success    Extremity/Trunk Assessment Upper Extremity Assessment Upper Extremity Assessment: Generalized weakness;Right hand dominant   Lower Extremity Assessment Lower Extremity Assessment: Defer to PT evaluation                 Communication Communication Communication: No apparent difficulties   Cognition Arousal: Alert Behavior During Therapy: WFL for tasks assessed/performed Cognition: Cognition impaired       Memory impairment (select all impairments): Short-term memory (mild deficits) Attention impairment (select first level of impairment): Divided attention   OT - Cognition Comments: Oriented x 4, lild decreased insight and cues for higher level problem solving but extremely cooperative and interactive with progression of mobility and able to carryover novel sock aide directions                 Following commands: Intact        Cueing   Cueing Techniques: Verbal cues  Exercises Exercises: Other exercises (Lv 1 tband for triceps press 3 sets of 10 reps with min cues and rests between sets with demo)       General Comments added pillows to offload pressure on buttocks with cues to ensure hips aligned in chair, decreased standing tolerance to ~ 2 minute intervals x 4 sets with RW support, tolerated tband and LB dressing with sick aide and gown change this visit    Pertinent Vitals/ Pain       Pain Assessment Pain Assessment: No/denies pain   Frequency  Min 2X/week        Progress Toward Goals  OT Goals(current goals can now be found in the care plan section)   Progress towards OT goals: Progressing toward goals  Acute Rehab OT Goals Patient Stated Goal: to get to a good rehab setting OT Goal Formulation: With patient/family Time For Goal Achievement: 03/12/24 Potential to Achieve Goals: Good ADL Goals Pt Will Perform Lower Body Dressing: with contact guard assist;sit to/from stand;sitting/lateral leans;with adaptive equipment Pt Will Transfer to Toilet: with contact guard assist;ambulating Pt Will Perform Toileting - Clothing Manipulation and hygiene: with contact guard assist;sit to/from stand Additional ADL Goal #1: The pt will perform bed mobility with CGA, in prep for progressive ADL participation.  Plan         AM-PAC OT 6 Clicks Daily Activity     Outcome Measure   Help from another person eating meals?: None Help from another person taking care of personal grooming?: A Little Help from another person toileting, which includes using toliet, bedpan, or urinal?: A Lot Help from another person bathing (including washing, rinsing, drying)?: A Lot Help from another person to put on and taking off regular upper body clothing?: A Little Help from another person to put on and taking off regular lower body clothing?: A Lot 6 Click Score: 16    End of Session Equipment Utilized During Treatment: Gait belt;Rolling walker (2 wheels)  OT Visit Diagnosis: Unsteadiness on feet (R26.81);Other abnormalities of gait and mobility (R26.89);Pain;Muscle weakness (generalized) (M62.81)   Activity Tolerance Patient tolerated treatment well   Patient  Left in chair;with call bell/phone within reach;with chair alarm set   Nurse Communication Mobility status        Time: 9049-8966 OT Time Calculation (min): 43 min  Charges: OT General Charges $OT Visit: 1 Visit OT Treatments $Self Care/Home Management : 8-22 mins $Therapeutic Activity: 8-22 mins  Una Yeomans OT/L Acute Rehabilitation Department  [phone]  03/02/2024, 10:54 AM

## 2024-03-02 NOTE — Progress Notes (Signed)
 " Progress Note   Patient: Charles Lloyd. 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  He was found to have a 3.5 x 2.4 cm L psoas muscle abscess.  Neurosurgery recommended IR drainage and IV antibiotics.  CT-guided aspiration on 12/29.  Developed hospital-associated delirium requiring ICU transfer, now improved.  Will need STR, medically stable when bed is available.    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 He is recommended to change antibiotics to Dapto/Cefepime  and OPAT has been arranged Treatment for 6 weeks through 04/13/24 via PICC line He is medically stable for dc to STR when a bed is available He was offered a bed on 1/4 with insurance approval but the family declined; will ask CIR to reassess and will need to dc to CIR vs. STR vs. Home with [facility] services depending on availability and family preference  Delirium Alcohol withdrawal syndrome, with delirium [facility] delirium, alcohol withdrawal with history of dementia MRI brain unremarkable Off precedex  drip Taper off phenobarbital , will complete on 1/2 Continue delirium precaution, frequent reorientation CT head negative for acute findings This  issue appears to have resolved and his son is concerned that it was a paradoxical response to Ativan ; added as allergy Paroxysmal A-fib (HCC) Resumed Eliquis  12/31 Continue flecainide  and Coreg  for rate/rhythm control Has prolonged QTc Attempt to avoid QT-prolonging medication when possible Chronic heart failure with preserved ejection fraction (HFpEF) (HCC) Compensated, no exacerbation Hold torsemide  for now, resume when needed NIDDM-2 with polyneuropathy and hyperglycemia A1c was 5.9% earlier this month, good control Diet controlled Will not continue insulin  at time of discharge Hypercholesterolemia Continue atorvastatin  Asthma No exacerbation Continue Singulair  and as-needed albuterol  Essential hypertension Continue Coreg  and hydralazine  Recurrent falls Patient reports multiple falls in last few weeks that he attributes to leg weakness, pain, and tremors Fall precautions PT/ OT consulted He is being considered for CIR vs. STR  Based on lack of 24/7 care at home, he is being recommended for STR  Unable to get a bed at [facility] so expanded search He has been offered a bed at [facility] and insurance has approved Family is refusing [facility] and again requesting CIR so this is now being reconsidered If CIR is not available, family will need to select a facility for STR or will need to go home with [facility] services  Pressure injury Wound 02/24/24 1245 Pressure Injury Buttocks Mid;Lower Deep Tissue Pressure Injury - Purple or maroon localized area of discolored intact skin or blood-filled blister due to damage of underlying soft tissue from pressure and/or shear. (Active)     Wound 02/24/24 1245 Pressure Injury Scrotum Circumferential Deep Tissue Pressure Injury - Purple or maroon localized area of discolored intact skin or blood-filled blister due to damage of underlying soft tissue  from pressure and/or shear. (Active)  POA Acute on chronic urinary retention Remote h/o  prostatectomy Has had periodic issues with retention Has had indwelling foley in the past Having retention issues while here requiring I/O caths Will place foley Start tamsulosin  Will need urology follow up appointment with voiding trial in the future      Consultants: Neurosurgery IR PCCM PT OT ICM team   Procedures: CT-guided aspiration of L psoas fluid collection 12/29   Antibiotics: Linezolid  12/28- Zosyn  12/27- Vancomycin  x 1  30 Day Unplanned Readmission Risk Score    Flowsheet Row ED to Hosp-Admission (Current) from 02/22/2024 in  4TH FLOOR PROGRESSIVE CARE AND UROLOGY  30 Day Unplanned Readmission Risk Score (%) 31.38 Filed at 03/02/2024 1200    This score is the patient's risk of an unplanned readmission within 30 days of being discharged (0 -100%). The score is based on dignosis, age, lab data, medications, orders, and past utilization.   Low:  0-14.9   Medium: 15-21.9   High: 22-29.9   Extreme: 30 and above           Subjective: Feeling ok - frustrated because he needed to go to the bathroom at the time of my evaluation.     Objective: Vitals:   03/02/24 0416 03/02/24 1405  BP: 133/73 (!) 150/82  Pulse: 79 95  Resp: 16 20  Temp: 97.9 F (36.6 C) 98.2 F (36.8 C)  SpO2: 96% 100%    Intake/Output Summary (Last 24 hours) at 03/02/2024 1420 Last data filed at 03/02/2024 0423 Gross per 24 hour  Intake --  Output 775 ml  Net -775 ml   Filed Weights   02/22/24 1238 02/24/24 1245  Weight: 79.4 kg 79.3 kg    Exam:  General:  Appears calm and comfortable and is in NAD Eyes:  normal lids, iris ENT:  grossly normal hearing, lips & tongue, mmm Cardiovascular:  RRR, no m/r/g. No LE edema.  Respiratory:   CTA bilaterally with no wheezes/rales/rhonchi.  Normal respiratory effort. Abdomen:  soft, NT, ND Skin:  no rash or induration seen on limited exam Musculoskeletal:  grossly normal tone BUE/BLE, good ROM, no bony  abnormality Psychiatric:  grossly normal mood and affect, speech fluent and appropriate, AOx3 Neurologic:  CN 2-12 grossly intact, moves all extremities in coordinated fashion, sensation intact  Data Reviewed: I have reviewed the patient's lab results since admission.  Pertinent labs for today include:   None today     Family Communication: His son was present following my evaluation  Mobility: PT/OT Consulted and are recommending - Acute Inpatient Rehab (3hours/Day)02/28/2024 1500    Code Status: Full Code   Disposition: Status is: Inpatient Remains inpatient appropriate because: awaiting placement     Time spent: 35 minutes  Unresulted Labs (From admission, onward)    None        Author: Delon Herald, MD 03/02/2024 2:20 PM  For on call review [email or web address].            "

## 2024-03-02 NOTE — Progress Notes (Signed)
 Inpatient Rehab Admissions Coordinator:   Spoke with patient's son, Kevion by phone. Son wants CIR and will hire help at home as needed upon discharge. Will submit clinical information to insurance for CIR prior auth. Awaiting updated PT note to send out. Will continue to follow for potential CIR admission.   Rehab Admissons Coordinator Rishab Stoudt, [place], [place] [phone]

## 2024-03-02 NOTE — Assessment & Plan Note (Signed)
 A1c was 5.9% earlier this month, good control Diet controlled Will not continue insulin  at time of discharge

## 2024-03-02 NOTE — Assessment & Plan Note (Deleted)
 Patient reports multiple falls in last few weeks that he attributes to leg weakness, pain, and tremors Fall precautions PT/ OT consulted He is being considered for CIR vs. STR  Based on lack of 24/7 care at home, he is being recommended for STR  Family has requested repeat consideration of CIR If CIR is not available, family will need to select a facility for STR or will need to go home with [facility] services

## 2024-03-02 NOTE — Assessment & Plan Note (Signed)
"  Continue atorvastatin   "

## 2024-03-02 NOTE — Assessment & Plan Note (Deleted)
 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 Presented with LLE weakness, pain, and recurrent falls Imaging with L psoas muscle abscess S/p IR CT guided aspiration of left psoas fluid collection, drain placed Continue broad-spectrum antibiotics - Zosyn , linezolid  No growth on fluid and blood cultures He continues to have significant LLE pain with standing but this does appear to be improving Pain control with APAP, gabapentin  ID consulted and recommends treatment as L4-5 site infection complicated by hardware He is recommended to change antibiotics to Dapto/Cefepime  and OPAT has been arranged Treatment for 6 weeks through 04/13/24 via PICC line He is medically stable for dc to STR when a bed is available He was offered a bed on 1/4 with insurance approval but the family declined; will ask CIR to reassess and will need to dc to CIR vs. STR vs. Home with [facility] services depending on availability and family prefernce

## 2024-03-02 NOTE — Assessment & Plan Note (Deleted)
"  Continue atorvastatin   "

## 2024-03-02 NOTE — Progress Notes (Signed)
 Inpatient Rehab Admissions Coordinator Note:   Notified by [facility] and MD that pt's family working on arranging 24/7 supervision.  I will place a consult order so we can reassess and an [facility] will follow up.   Reche Lowers, PT, DPT [phone] 03/02/2024 9:58 AM

## 2024-03-02 NOTE — Assessment & Plan Note (Deleted)
 Wound 02/24/24 1245 Pressure Injury Buttocks Mid;Lower Deep Tissue Pressure Injury - Purple or maroon localized area of discolored intact skin or blood-filled blister due to damage of underlying soft tissue from pressure and/or shear. (Active)     Wound 02/24/24 1245 Pressure Injury Scrotum Circumferential Deep Tissue Pressure Injury - Purple or maroon localized area of discolored intact skin or blood-filled blister due to damage of underlying soft tissue from pressure and/or shear. (Active)  POA

## 2024-03-02 NOTE — TOC Progression Note (Addendum)
 Transition of Care (TOC) - Progression Note    Patient Details  Name: Charles Lloyd. MRN: [id number] Date of Birth: 27-Jan-1941  Transition of Care [facility]) CM/SW Contact  Jimi Giza, Nathanel, RN Phone Number: 03/02/2024, 10:02 AM  Clinical Narrative: Beatris to Lonzie(son) about d/c plans-he declines [facility] choices. He is setting up private duty care. Will await CIR eval outcome. -3:43p-spoke to dtr in law to inform for d/c plans-we are waiting on documentation, & assessments-voiced understanding.      Expected Discharge Plan: IP Rehab Facility Barriers to Discharge: Continued Medical Work up               Expected Discharge Plan and Services In-house Referral: NA Discharge Planning Services: CM Consult Post Acute Care Choice: Durable Medical Equipment, IP Rehab Living arrangements for the past 2 months: Single Family Home Expected Discharge Date: 03/01/24                                     Social Drivers of Health (SDOH) Interventions SDOH Screenings   Food Insecurity: No Food Insecurity (02/23/2024)  Housing: Low Risk (02/23/2024)  Transportation Needs: No Transportation Needs (02/23/2024)  Utilities: Not At Risk (02/23/2024)  Alcohol Screen: Low Risk (12/21/2022)  Depression (PHQ2-9): Low Risk (02/11/2024)  Financial Resource Strain: Low Risk (12/21/2022)  Physical Activity: Inactive (12/21/2022)  Social Connections: Socially Isolated (02/23/2024)  Stress: No Stress Concern Present (12/21/2022)  Tobacco Use: Low Risk (02/22/2024)  Health Literacy: Adequate Health Literacy (12/21/2022)    Readmission Risk Interventions    02/26/2024    2:28 PM 08/08/2021   10:00 AM  Readmission Risk Prevention Plan  Transportation Screening Complete Complete  PCP or Specialist Appt within 3-5 Days Complete   HRI or Home Care Consult Complete   Social Work Consult for Recovery Care Planning/Counseling Complete   Palliative Care Screening Not Applicable   Medication  Review Oceanographer) Complete Complete  PCP or Specialist appointment within 3-5 days of discharge  Complete  HRI or Home Care Consult  Complete  SW Recovery Care/Counseling Consult  Not Complete  Palliative Care Screening  Not Applicable  Skilled Nursing Facility  Not Applicable

## 2024-03-02 NOTE — Assessment & Plan Note (Deleted)
 Resumed Eliquis  12/31 Continue flecainide  and Coreg  for rate/rhythm control Has prolonged QTc Attempt to avoid QT-prolonging medication when possible

## 2024-03-02 NOTE — Assessment & Plan Note (Signed)
 Hospital delirium, alcohol withdrawal with history of dementia MRI brain unremarkable Off precedex  drip Taper off phenobarbital , will complete on 1/2 Continue delirium precaution, frequent reorientation CT head negative for acute findings This issue appears to have resolved and his son is concerned that it was a paradoxical response to Ativan ; added as allergy

## 2024-03-02 NOTE — Assessment & Plan Note (Signed)
 Patient reports multiple falls in last few weeks that he attributes to leg weakness, pain, and tremors Fall precautions PT/ OT consulted He is being considered for CIR vs. STR  Based on lack of 24/7 care at home, he is being recommended for STR  Unable to get a bed at [facility] so expanded search He has been offered a bed at [facility] and insurance has approved Family is refusing [facility] and again requesting CIR so this is now being reconsidered If CIR is not available, family will need to select a facility for STR or will need to go home with [facility] services

## 2024-03-02 NOTE — Assessment & Plan Note (Signed)
 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 Presented with LLE weakness, pain, and recurrent falls Imaging with L psoas muscle abscess S/p IR CT guided aspiration of left psoas fluid collection, drain placed Continue broad-spectrum antibiotics - Zosyn , linezolid  No growth on fluid and blood cultures He continues to have significant LLE pain with standing but this does appear to be improving Pain control with APAP, gabapentin  ID consulted and recommends treatment as L4-5 site infection complicated by hardware He is recommended to change antibiotics to Dapto/Cefepime  and OPAT has been arranged Treatment for 6 weeks through 04/13/24 via PICC line He is medically stable for dc to STR when a bed is available He was offered a bed on 1/4 with insurance approval but the family declined; will ask CIR to reassess and will need to dc to CIR vs. STR vs. Home with [facility] services depending on availability and family preference

## 2024-03-02 NOTE — Assessment & Plan Note (Deleted)
 Hospital delirium, alcohol withdrawal with history of dementia MRI brain unremarkable Off precedex  drip Taper off phenobarbital , will complete on 1/2 Continue delirium precaution, frequent reorientation CT head negative for acute findings This issue appears to have resolved and his son is concerned that it was a paradoxical response to Ativan ; added as allergy

## 2024-03-02 NOTE — Assessment & Plan Note (Deleted)
 Compensated, no exacerbation Hold torsemide  for now, resume when needed

## 2024-03-02 NOTE — Assessment & Plan Note (Deleted)
 A1c was 5.9% earlier this month, good control Diet controlled Will not continue insulin  at time of discharge

## 2024-03-02 NOTE — Progress Notes (Signed)
 Physical Therapy Treatment Patient Details Name: Charles Lloyd. MRN: [id number] DOB: June 09, 1940 Today's Date: 03/02/2024   History of Present Illness Charles Lloyd. is a 84 y.o. male who presented to the emergency department on 02/22/2024 for leg pain and weakness, with family reporting multiple falls over the past 6 to 8 months.  MRI lumbar spine revealed a new 3.5 x 2.4 cm fluid collection in the left psoas muscle concerning for abscess.  Neurosurgery was consulted at that time and recommended IV antibiotics and possible aspiration of abscess.  with significant PMH of hypertension, hyperlipidemia, type 2 diabetes, atrial fibrillation on anticoagulation, s/p recent L4-5 trans psoas lumbar interbody fusion left-sided approach on 02/06/2024, CHF,gout,CAD, TKA with infection, neuropathy    PT Comments   Pt admitted with above diagnosis.  Pt currently with functional limitations due to the deficits listed below (see PT Problem List). Pt seated in recliner when PT arrived. Daughter in law present. Pt agreeable to therapy intervention. Nursing staff able to saline lock IV for gait tasks. Pt required increased time and cues for proper UE placement for sit to stand  from recliner, initial standing mod A due to posterior lean, pt guided though anterior weight shift and increased BOS with B UE support at RW, pt limited with trunk and cervical extension, gait tasks 30 feet in hallway with pt requiring increased physical assist from CGA to total A with fatigue and expressed fear of falling and that his L LE was not going to hold him up, pt B LE became unstable and pt unable to complete gait tasks thus requiring PT to have pt sit on PT L LE with family assisting to support pt and nursing placing recliner behind pt. Pt indicated that he was tired, B LE unstable and had been up and OOB of the bed for a majority of the day and was not up to par today. Pt ed provided on healthy fear of falling vs debilitating fear  of falling, pt appeared to receive education well and wants to progress with therapy. Pts family now able to provide 24/7 supervision and assist following d/c and PT continues to recommend intensive inpatient follow-up therapy, >3 hours/day.   Pt will benefit from acute skilled PT to increase their independence and safety with mobility to allow discharge.      If plan is discharge home, recommend the following: Assist for transportation;Help with stairs or ramp for entrance;Assistance with cooking/housework;A lot of help with walking and/or transfers;A lot of help with bathing/dressing/bathroom   Can travel by private vehicle        Equipment Recommendations  None recommended by PT    Recommendations for Other Services Rehab consult     Precautions / Restrictions Precautions Precautions: Fall Recall of Precautions/Restrictions: Impaired Precaution/Restrictions Comments: trunk flexion at baseline Restrictions Weight Bearing Restrictions Per Provider Order: No     Mobility  Bed Mobility               General bed mobility comments: pt was received seated in the recliner and returned to recliner at end of session    Transfers Overall transfer level: Needs assistance Equipment used: Rolling walker (2 wheels) Transfers: Sit to/from Stand Sit to Stand: Mod assist           General transfer comment: cues for proper UE and AD placement, pt exhibited strong posterior lean with initial standing and required cues for anterior weight shift, wide BOS and proper UE placement, pt is limited  with trunk extension at baseline and compounded by recent surgery.    Ambulation/Gait Ambulation/Gait assistance: Min assist Gait Distance (Feet): 30 Feet Assistive device: Rolling walker (2 wheels) Gait Pattern/deviations: Step-through pattern, Decreased stride length, Trunk flexed Gait velocity: Decreased     General Gait Details: pt demonstrates flexed posture and is unable to modifiy  trunk or cervical for improved visual scanning, pt required cues, pt repoted B LE instabiltiy noted to have decreased stide length and stated that his L LE was going to give out on him. pt also indicated fear of falling with hx of frequent falls, pt required progressingly increased assist with fatigue- CGA  initally then min A for balance and Rw management and then total for fall prevention with pt guided to PT's LE and nursing staff provided recliner. pt reported he was just tired and did not want to fall. family supporting pt with balance at end of gait and encouraging   Stairs             Wheelchair Mobility     Tilt Bed    Modified Rankin (Stroke Patients Only)       Balance Overall balance assessment: History of Falls, Needs assistance Sitting-balance support: Feet supported Sitting balance-Leahy Scale: Fair     Standing balance support: Bilateral upper extremity supported, During functional activity, Reliant on assistive device for balance Standing balance-Leahy Scale: Poor Standing balance comment: cues for upright head/posture, min A and B UE support at [employer] Communication Communication: Impaired Factors Affecting Communication: Difficulty expressing self  Cognition Arousal: Alert Behavior During Therapy: WFL for tasks assessed/performed   PT - Cognitive impairments: No apparent impairments                         Following commands: Intact (pt requried increased time for motor processing and planing and reported fear of falling)      Cueing Cueing Techniques: Verbal cues  Exercises      General Comments General comments (skin integrity, edema, etc.): noted B UE edema and elevated with pillows at end of session      Pertinent Vitals/Pain Pain Assessment Pain Assessment: Faces Faces Pain Scale: Hurts little more Pain Location: generalized back, B LE Pain Descriptors / Indicators: Aching,  Discomfort, Dull Pain Intervention(s): Monitored during session, Limited activity within patient's tolerance    Home Living                          Prior Function            PT Goals (current goals can now be found in the care plan section) Acute Rehab PT Goals Patient Stated Goal: To be able to manage at home alone. Progress towards PT goals: Progressing toward goals    Frequency    Min 3X/week      PT Plan      Co-evaluation              AM-PAC PT 6 Clicks Mobility   Outcome Measure  Help needed turning from your back to your side while in a flat bed without using bedrails?: A Lot Help needed moving from lying on your back to sitting on the side of a flat bed without using bedrails?: A Lot Help needed moving to and  from a bed to a chair (including a wheelchair)?: A Little Help needed standing up from a chair using your arms (e.g., wheelchair or bedside chair)?: A Little Help needed to walk in hospital room?: A Little Help needed climbing 3-5 steps with a railing? : Total 6 Click Score: 14    End of Session Equipment Utilized During Treatment: Gait belt Activity Tolerance: Patient limited by fatigue Patient left: with call bell/phone within reach;in chair;with chair alarm set;with family/visitor present Nurse Communication: Mobility status PT Visit Diagnosis: Unsteadiness on feet (R26.81);Pain Pain - Right/Left: Left Pain - part of body: Leg;Knee;Hip (back and R LE)     Time: 8464-8440 PT Time Calculation (min) (ACUTE ONLY): 24 min  Charges:    $Gait Training: 8-22 mins $Therapeutic Activity: 8-22 mins PT General Charges $$ ACUTE PT VISIT: 1 Visit                     [place], PT Acute Rehab    [place] VEAR Drone 03/02/2024, 4:30 PM

## 2024-03-02 NOTE — Assessment & Plan Note (Deleted)
-  Continue Coreg and hydralazine °

## 2024-03-02 NOTE — Assessment & Plan Note (Signed)
 Resumed Eliquis  12/31 Continue flecainide  and Coreg  for rate/rhythm control Has prolonged QTc Attempt to avoid QT-prolonging medication when possible

## 2024-03-02 NOTE — Assessment & Plan Note (Signed)
-  Continue Coreg and hydralazine °

## 2024-03-02 NOTE — Assessment & Plan Note (Signed)
 Remote h/o prostatectomy Has had periodic issues with retention Has had indwelling foley in the past Having retention issues while here requiring I/O caths Will place foley Start tamsulosin  Will need urology follow up appointment with voiding trial in the future

## 2024-03-02 NOTE — Assessment & Plan Note (Signed)
 No exacerbation Continue Singulair  and as-needed albuterol 

## 2024-03-02 NOTE — Assessment & Plan Note (Deleted)
 No exacerbation Continue Singulair  and as-needed albuterol 

## 2024-03-02 NOTE — Assessment & Plan Note (Signed)
 Wound 02/24/24 1245 Pressure Injury Buttocks Mid;Lower Deep Tissue Pressure Injury - Purple or maroon localized area of discolored intact skin or blood-filled blister due to damage of underlying soft tissue from pressure and/or shear. (Active)     Wound 02/24/24 1245 Pressure Injury Scrotum Circumferential Deep Tissue Pressure Injury - Purple or maroon localized area of discolored intact skin or blood-filled blister due to damage of underlying soft tissue from pressure and/or shear. (Active)  POA

## 2024-03-03 DIAGNOSIS — K6812 Psoas muscle abscess: Secondary | ICD-10-CM | POA: Diagnosis not present

## 2024-03-03 LAB — CBC
HCT: 26.8 % — ABNORMAL LOW (ref 39.0–52.0)
Hemoglobin: 9.2 g/dL — ABNORMAL LOW (ref 13.0–17.0)
MCH: 29.8 pg (ref 26.0–34.0)
MCHC: 34.3 g/dL (ref 30.0–36.0)
MCV: 86.7 fL (ref 80.0–100.0)
Platelets: 90 K/uL — ABNORMAL LOW (ref 150–400)
RBC: 3.09 MIL/uL — ABNORMAL LOW (ref 4.22–5.81)
RDW: 17.1 % — ABNORMAL HIGH (ref 11.5–15.5)
WBC: 4.7 K/uL (ref 4.0–10.5)
nRBC: 0 % (ref 0.0–0.2)

## 2024-03-03 LAB — BASIC METABOLIC PANEL WITH GFR
Anion gap: 9 (ref 5–15)
BUN: 11 mg/dL (ref 8–23)
CO2: 20 mmol/L — ABNORMAL LOW (ref 22–32)
Calcium: 8.8 mg/dL — ABNORMAL LOW (ref 8.9–10.3)
Chloride: 97 mmol/L — ABNORMAL LOW (ref 98–111)
Creatinine, Ser: 0.74 mg/dL (ref 0.61–1.24)
GFR, Estimated: >60 mL/min
Glucose, Bld: 104 mg/dL — ABNORMAL HIGH (ref 70–99)
Potassium: 3.5 mmol/L (ref 3.5–5.1)
Sodium: 126 mmol/L — ABNORMAL LOW (ref 135–145)

## 2024-03-03 LAB — GLUCOSE, CAPILLARY
Glucose-Capillary: 91 mg/dL (ref 70–99)
Glucose-Capillary: 91 mg/dL (ref 70–99)
Glucose-Capillary: 108 mg/dL — ABNORMAL HIGH (ref 70–99)
Glucose-Capillary: 110 mg/dL — ABNORMAL HIGH (ref 70–99)

## 2024-03-03 MED ORDER — ALLOPURINOL 300 MG PO TABS
300.0000 mg | ORAL_TABLET | Freq: Every day | ORAL | Status: DC
  Administered 2024-03-03 – 2024-03-07 (×5): 300 mg via ORAL
  Filled 2024-03-03 (×5): qty 1

## 2024-03-03 MED ORDER — SODIUM CHLORIDE 1 G PO TABS
1.0000 g | ORAL_TABLET | Freq: Three times a day (TID) | ORAL | Status: DC
  Administered 2024-03-03 – 2024-03-04 (×4): 1 g via ORAL
  Filled 2024-03-03 (×4): qty 1

## 2024-03-03 NOTE — Assessment & Plan Note (Addendum)
 Remote h/o prostatectomy Has had periodic issues with retention Has had indwelling foley in the past Having retention issues while here requiring I/O caths Placed foley Start tamsulosin  Son would like fo r him to have a voiding trial tomorrow Will need urology follow up appointment with voiding trial in the future if he fails

## 2024-03-03 NOTE — Assessment & Plan Note (Addendum)
-  Continue Coreg and hydralazine °

## 2024-03-03 NOTE — Assessment & Plan Note (Addendum)
 Compensated, no exacerbation Hold torsemide  for now, resume when needed

## 2024-03-03 NOTE — Assessment & Plan Note (Addendum)
 Hospital delirium, alcohol withdrawal with history of dementia MRI brain unremarkable Off precedex  drip Taper off phenobarbital , will complete on 1/2 Continue delirium precaution, frequent reorientation CT head negative for acute findings This issue appears to have resolved and his son is concerned that it was a paradoxical response to Ativan ; added as allergy

## 2024-03-03 NOTE — Assessment & Plan Note (Addendum)
 No exacerbation Continue Singulair  and as-needed albuterol 

## 2024-03-03 NOTE — Assessment & Plan Note (Signed)
 Has had periodically Worsening since presentation Will add salt tabs TID x 3 days and continue to follow

## 2024-03-03 NOTE — Assessment & Plan Note (Addendum)
"  Continue atorvastatin   "

## 2024-03-03 NOTE — TOC Progression Note (Signed)
 Transition of Care (TOC) - Progression Note    Patient Details  Name: Charles Lloyd. MRN: [id number] Date of Birth: 1940/06/04  [facility] CM/SW Contact  Carmon Brigandi, Nathanel, RN Phone Number: 03/03/2024, 2:43 PM  Clinical Narrative: Awaiting outcome from CIR auth prior assisting w/appropriate options per PT recc.      Expected Discharge Plan: IP Rehab Facility Barriers to Discharge: Continued Medical Work up               Expected Discharge Plan and Services In-house Referral: NA Discharge Planning Services: CM Consult Post Acute Care Choice: Durable Medical Equipment, IP Rehab Living arrangements for the past 2 months: Single Family Home Expected Discharge Date: 03/01/24                                     Social Drivers of Health (SDOH) Interventions SDOH Screenings   Food Insecurity: No Food Insecurity (02/23/2024)  Housing: Low Risk (02/23/2024)  Transportation Needs: No Transportation Needs (02/23/2024)  Utilities: Not At Risk (02/23/2024)  Alcohol Screen: Low Risk (12/21/2022)  Depression (PHQ2-9): Low Risk (02/11/2024)  Financial Resource Strain: Low Risk (12/21/2022)  Physical Activity: Inactive (12/21/2022)  Social Connections: Socially Isolated (02/23/2024)  Stress: No Stress Concern Present (12/21/2022)  Tobacco Use: Low Risk (02/22/2024)  Health Literacy: Adequate Health Literacy (12/21/2022)    Readmission Risk Interventions    02/26/2024    2:28 PM 08/08/2021   10:00 AM  Readmission Risk Prevention Plan  Transportation Screening Complete Complete  PCP or Specialist Appt within 3-5 Days Complete   HRI or Home Care Consult Complete   Social Work Consult for Recovery Care Planning/Counseling Complete   Palliative Care Screening Not Applicable   Medication Review Oceanographer) Complete Complete  PCP or Specialist appointment within 3-5 days of discharge  Complete  HRI or Home Care Consult  Complete  SW Recovery  Care/Counseling Consult  Not Complete  Palliative Care Screening  Not Applicable  Skilled Nursing Facility  Not Applicable

## 2024-03-03 NOTE — Assessment & Plan Note (Addendum)
 Resumed Eliquis  12/31 Continue flecainide  and Coreg  for rate/rhythm control Has prolonged QTc Attempt to avoid QT-prolonging medication when possible

## 2024-03-03 NOTE — Progress Notes (Addendum)
" ° °  Inpatient Rehabilitation Admissions Coordinator   I will begin insurance Auth for possible Cir admit.  I spoke with son by phone. He is aware that insurance may not approve CIR, therefore other SNF venues should also be considered. He is in agreement and would like TOC to continue to pursue preferred SNFs.   Heron Leavell, RN, MSN Rehab Admissions Coordinator [phone] 03/03/2024 8:23 AM  "

## 2024-03-03 NOTE — Progress Notes (Signed)
 " Progress Note   Patient: Charles Lloyd. 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  He was found to have a 3.5 x 2.4 cm L psoas muscle abscess.  Neurosurgery recommended IR drainage and IV antibiotics.  CT-guided aspiration on 12/29.  Developed hospital-associated delirium requiring ICU transfer, now improved.  Will need STR, medically stable when bed is available.    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 He is recommended to change antibiotics to Dapto/Cefepime  and OPAT has been arranged Treatment for 6 weeks through 04/13/24 via PICC line He is medically stable for dc to STR when a bed is available He was offered a bed on 1/4 with insurance approval but the family declined; will ask CIR to reassess and will need to dc to CIR vs. STR vs. Home with [facility] services depending on availability and family preference  Delirium Alcohol withdrawal syndrome, with delirium [facility]) Hospital delirium, alcohol withdrawal with history of dementia MRI brain unremarkable Off precedex  drip Taper off phenobarbital , will complete on 1/2 Continue delirium precaution, frequent reorientation CT head negative for acute findings This  issue appears to have resolved and his son is concerned that it was a paradoxical response to Ativan ; added as allergy Paroxysmal A-fib (HCC) Resumed Eliquis  12/31 Continue flecainide  and Coreg  for rate/rhythm control Has prolonged QTc Attempt to avoid QT-prolonging medication when possible Chronic heart failure with preserved ejection fraction (HFpEF) (HCC) Compensated, no exacerbation Hold torsemide  for now, resume when needed NIDDM-2 with polyneuropathy and hyperglycemia A1c was 5.9% earlier this month, good control Diet controlled Will not continue insulin  at time of discharge Hypercholesterolemia Continue atorvastatin  Asthma No exacerbation Continue Singulair  and as-needed albuterol  Essential hypertension Continue Coreg  and hydralazine  Recurrent falls Patient reports multiple falls in last few weeks that he attributes to leg weakness, pain, and tremors Fall precautions PT/ OT consulted He is being considered for CIR vs. STR  Based on lack of 24/7 care at home, he is being recommended for STR  Unable to get a bed at [facility] so expanded search He has been offered a bed at [facility] and insurance has approved Family is refusing [facility] and again requesting CIR so this is now being reconsidered If CIR is not available, family will need to select a facility for STR or will need to go home with [facility] services  Pressure injury Wound 02/24/24 1245 Pressure Injury Buttocks Mid;Lower Deep Tissue Pressure Injury - Purple or maroon localized area of discolored intact skin or blood-filled blister due to damage of underlying soft tissue from pressure and/or shear. (Active)     Wound 02/24/24 1245 Pressure Injury Scrotum Circumferential Deep Tissue Pressure Injury - Purple or maroon localized area of discolored intact skin or blood-filled blister due to damage of underlying soft tissue  from pressure and/or shear. (Active)  POA Acute on chronic urinary retention Remote h/o  prostatectomy Has had periodic issues with retention Has had indwelling foley in the past Having retention issues while here requiring I/O caths Placed foley Start tamsulosin  Son would like fo r him to have a voiding trial tomorrow Will need urology follow up appointment with voiding trial in the future if he fails Hyponatremia Has had periodically Worsening since presentation Will add salt tabs TID x 3 days and continue to follow      Consultants: Neurosurgery IR PCCM PT OT ICM team   Procedures: CT-guided aspiration of L psoas fluid collection 12/29   Antibiotics: Linezolid  12/28- Zosyn  12/27- Vancomycin  x 1  30 Day Unplanned Readmission Risk Score    Flowsheet Row ED to Hosp-Admission (Current) from 02/22/2024 in [place] 4TH FLOOR PROGRESSIVE CARE AND UROLOGY  30 Day Unplanned Readmission Risk Score (%) 31.69 Filed at 03/03/2024 0801    This score is the patient's risk of an unplanned readmission within 30 days of being discharged (0 -100%). The score is based on dignosis, age, lab data, medications, orders, and past utilization.   Low:  0-14.9   Medium: 15-21.9   High: 22-29.9   Extreme: 30 and above           Subjective: Mildly confused today.  Son doesn't want him to do the voiding trial tonight, would like to try tomorrow instead.   Objective: Vitals:   03/03/24 0456 03/03/24 1308  BP: (!) 128/58 (!) 122/52  Pulse: 74 70  Resp: 19 18  Temp: 97.9 F (36.6 C) (!) 97.5 F (36.4 C)  SpO2: 96% 99%    Intake/Output Summary (Last 24 hours) at 03/03/2024 1827 Last data filed at 03/03/2024 0526 Gross per 24 hour  Intake 40 ml  Output 675 ml  Net -635 ml   Filed Weights   02/22/24 1238 02/24/24 1245  Weight: 79.4 kg 79.3 kg    Exam:  General:  Appears calm and comfortable and is in NAD Eyes:  normal lids, iris ENT:  grossly normal hearing, lips & tongue, mmm Cardiovascular:  RRR. No LE edema.  Respiratory:   CTA bilaterally with no  wheezes/rales/rhonchi.  Normal respiratory effort. Abdomen:  soft, NT, ND Skin:  no rash or induration seen on limited exam Musculoskeletal:  generalized weakness, no bony abnormality Psychiatric:  mildly confused mood and affect, speech fluent and appropriate Neurologic:  CN 2-12 grossly intact, moves all extremities in coordinated fashion  Data Reviewed: I have reviewed the patient's lab results since admission.  Pertinent labs for today include:   Na++ 126 CO2 20 Glucose 104 WBC 4.7 Hgb 9.2 Platelets 90     Family Communication: None present; I spoke with his son by telephone  Mobility: PT/OT Consulted and are recommending - Acute Inpatient Rehab (3hours/Day)03/02/2024 1600    Code Status: Full Code   Disposition: Status is: Inpatient Remains inpatient appropriate because: awaiting placement     Time spent: 50 minutes  Unresulted Labs (From admission, onward)     Start     Ordered   03/04/24 0500  Basic metabolic panel with GFR  Tomorrow morning,   R       Question:  Specimen collection method  Answer:  IV Team=IV Team collect   03/03/24 1827   03/04/24 0500  CBC  Tomorrow morning,   R       Question:  Specimen collection method  Answer:  IV Team=IV Team collect  03/03/24 1827             Author: Delon Herald, MD 03/03/2024 6:27 PM  For on call review [email or web address].            "

## 2024-03-03 NOTE — Assessment & Plan Note (Addendum)
 A1c was 5.9% earlier this month, good control Diet controlled Will not continue insulin  at time of discharge

## 2024-03-03 NOTE — Assessment & Plan Note (Addendum)
 Wound 02/24/24 1245 Pressure Injury Buttocks Mid;Lower Deep Tissue Pressure Injury - Purple or maroon localized area of discolored intact skin or blood-filled blister due to damage of underlying soft tissue from pressure and/or shear. (Active)     Wound 02/24/24 1245 Pressure Injury Scrotum Circumferential Deep Tissue Pressure Injury - Purple or maroon localized area of discolored intact skin or blood-filled blister due to damage of underlying soft tissue from pressure and/or shear. (Active)  POA

## 2024-03-03 NOTE — Assessment & Plan Note (Addendum)
 Patient reports multiple falls in last few weeks that he attributes to leg weakness, pain, and tremors Fall precautions PT/ OT consulted He is being considered for CIR vs. STR  Based on lack of 24/7 care at home, he is being recommended for STR  Unable to get a bed at [facility] so expanded search He has been offered a bed at [facility] and insurance has approved Family is refusing [facility] and again requesting CIR so this is now being reconsidered If CIR is not available, family will need to select a facility for STR or will need to go home with [facility] services

## 2024-03-03 NOTE — Assessment & Plan Note (Addendum)
 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 Presented with LLE weakness, pain, and recurrent falls Imaging with L psoas muscle abscess S/p IR CT guided aspiration of left psoas fluid collection, drain placed Continue broad-spectrum antibiotics - Zosyn , linezolid  No growth on fluid and blood cultures He continues to have significant LLE pain with standing but this does appear to be improving Pain control with APAP, gabapentin  ID consulted and recommends treatment as L4-5 site infection complicated by hardware He is recommended to change antibiotics to Dapto/Cefepime  and OPAT has been arranged Treatment for 6 weeks through 04/13/24 via PICC line He is medically stable for dc to STR when a bed is available He was offered a bed on 1/4 with insurance approval but the family declined; will ask CIR to reassess and will need to dc to CIR vs. STR vs. Home with [facility] services depending on availability and family preference

## 2024-03-04 DIAGNOSIS — K6812 Psoas muscle abscess: Secondary | ICD-10-CM | POA: Diagnosis not present

## 2024-03-04 LAB — GLUCOSE, CAPILLARY
Glucose-Capillary: 96 mg/dL (ref 70–99)
Glucose-Capillary: 108 mg/dL — ABNORMAL HIGH (ref 70–99)
Glucose-Capillary: 118 mg/dL — ABNORMAL HIGH (ref 70–99)
Glucose-Capillary: 116 mg/dL — ABNORMAL HIGH (ref 70–99)

## 2024-03-04 LAB — CBC
HCT: 24.5 % — ABNORMAL LOW (ref 39.0–52.0)
Hemoglobin: 8.1 g/dL — ABNORMAL LOW (ref 13.0–17.0)
MCH: 29.1 pg (ref 26.0–34.0)
MCHC: 33.1 g/dL (ref 30.0–36.0)
MCV: 88.1 fL (ref 80.0–100.0)
Platelets: 80 K/uL — ABNORMAL LOW (ref 150–400)
RBC: 2.78 MIL/uL — ABNORMAL LOW (ref 4.22–5.81)
RDW: 16.4 % — ABNORMAL HIGH (ref 11.5–15.5)
WBC: 4.3 K/uL (ref 4.0–10.5)
nRBC: 0 % (ref 0.0–0.2)

## 2024-03-04 LAB — BASIC METABOLIC PANEL WITH GFR
Anion gap: 9 (ref 5–15)
BUN: 10 mg/dL (ref 8–23)
CO2: 21 mmol/L — ABNORMAL LOW (ref 22–32)
Calcium: 8.4 mg/dL — ABNORMAL LOW (ref 8.9–10.3)
Chloride: 96 mmol/L — ABNORMAL LOW (ref 98–111)
Creatinine, Ser: 0.75 mg/dL (ref 0.61–1.24)
GFR, Estimated: >60 mL/min
Glucose, Bld: 90 mg/dL (ref 70–99)
Potassium: 3.3 mmol/L — ABNORMAL LOW (ref 3.5–5.1)
Sodium: 125 mmol/L — ABNORMAL LOW (ref 135–145)

## 2024-03-04 MED ORDER — POTASSIUM CHLORIDE CRYS ER 20 MEQ PO TBCR
40.0000 meq | EXTENDED_RELEASE_TABLET | Freq: Once | ORAL | Status: AC
  Administered 2024-03-04: 40 meq via ORAL
  Filled 2024-03-04: qty 2

## 2024-03-04 MED ORDER — ZINC OXIDE 40 % EX OINT
TOPICAL_OINTMENT | Freq: Every day | CUTANEOUS | Status: DC | PRN
  Filled 2024-03-04 (×2): qty 57

## 2024-03-04 MED ORDER — TORSEMIDE 20 MG PO TABS
20.0000 mg | ORAL_TABLET | Freq: Two times a day (BID) | ORAL | Status: DC
  Administered 2024-03-04 – 2024-03-06 (×5): 20 mg via ORAL
  Filled 2024-03-04 (×6): qty 1

## 2024-03-04 MED ORDER — MELATONIN 5 MG PO TABS
5.0000 mg | ORAL_TABLET | Freq: Once | ORAL | Status: AC
  Administered 2024-03-04: 5 mg via ORAL
  Filled 2024-03-04: qty 1

## 2024-03-04 NOTE — Progress Notes (Signed)
" ° °  Inpatient Rehabilitation Admissions Coordinator   I have received the denial from [facility] Medicare for CIR. They recommend SNF. I called and spoke with his son, Mendell, by phone. We will sign off.  Heron Leavell, RN, MSN Rehab Admissions Coordinator [phone] 03/04/2024 4:39 PM  "

## 2024-03-04 NOTE — TOC Progression Note (Signed)
 Transition of Care (TOC) - Progression Note    Patient Details  Name: Charles Lloyd. MRN: [id number] Date of Birth: [birth year]  Transition of Care [facility]) CM/SW Contact  Jabar Krysiak, Nathanel, RN Phone Number: 03/04/2024, 4:22 PM  Clinical Narrative: Denied CIR. PT recc ST SNF-faxed out-await choices. Prefer [facility] w/s;[facility].      Expected Discharge Plan: Skilled Nursing Facility Barriers to Discharge: Continued Medical Work up               Expected Discharge Plan and Services In-house Referral: NA Discharge Planning Services: CM Consult Post Acute Care Choice: Durable Medical Equipment, IP Rehab Living arrangements for the past 2 months: Single Family Home Expected Discharge Date: 03/01/24                                     Social Drivers of Health (SDOH) Interventions SDOH Screenings   Food Insecurity: No Food Insecurity (02/23/2024)  Housing: Low Risk (02/23/2024)  Transportation Needs: No Transportation Needs (02/23/2024)  Utilities: Not At Risk (02/23/2024)  Alcohol Screen: Low Risk (12/21/2022)  Depression (PHQ2-9): Low Risk (02/11/2024)  Financial Resource Strain: Low Risk (12/21/2022)  Physical Activity: Inactive (12/21/2022)  Social Connections: Socially Isolated (02/23/2024)  Stress: No Stress Concern Present (12/21/2022)  Tobacco Use: Low Risk (02/22/2024)  Health Literacy: Adequate Health Literacy (12/21/2022)    Readmission Risk Interventions    02/26/2024    2:28 PM 08/08/2021   10:00 AM  Readmission Risk Prevention Plan  Transportation Screening Complete Complete  PCP or Specialist Appt within 3-5 Days Complete   HRI or Home Care Consult Complete   Social Work Consult for Recovery Care Planning/Counseling Complete   Palliative Care Screening Not Applicable   Medication Review Oceanographer) Complete Complete  PCP or Specialist appointment within 3-5 days of discharge  Complete  HRI or Home Care  Consult  Complete  SW Recovery Care/Counseling Consult  Not Complete  Palliative Care Screening  Not Applicable  Skilled Nursing Facility  Not Applicable

## 2024-03-04 NOTE — Progress Notes (Signed)
 Pt failed voiding trial today. Foley removed at 9am but was only able to urinate 75ml on his on. Foley placed and red urine returned. Provider made aware.

## 2024-03-04 NOTE — [place] FL2 (Signed)
 " [place]  MEDICAID FL2 LEVEL OF CARE FORM     IDENTIFICATION  Patient Name: Charles Lloyd. Birthdate: [birth year] Sex: male Admission Date (Current Location): 02/22/2024  [facility] and [place] Number:  Producer, Television/film/video and Address:  [facility],  [address] [place], [place] [address]      Provider Number: [id number]  Attending Physician Name and Address:  Cheryle Page, MD  Relative Name and Phone Number:  Excell Neyland [phone]    Current Level of Care: Hospital Recommended Level of Care: Skilled Nursing Facility Prior Approval Number:    Date Approved/Denied:   PASRR Number: [id number]  Discharge Plan: SNF    Current Diagnoses: Patient Active Problem List   Diagnosis Date Noted   Acute on chronic urinary retention 02/29/2024   Pressure injury 02/27/2024   Altered mental status 02/24/2024   Alcohol withdrawal syndrome, with delirium (HCC) 02/24/2024   Delirium 02/24/2024   Psoas abscess, left (HCC) 02/22/2024   Elective surgery 02/06/2024   Bilateral leg pain 11/29/2023   Acute posthemorrhagic anemia 06/21/2022   Bilateral lower extremity edema 04/06/2022   Coronary artery disease involving native coronary artery of native heart without angina pectoris 04/05/2022   Gout 02/27/2022   Acute renal failure superimposed on stage 3b chronic kidney disease (HCC) 11/16/2021   Uremia 11/16/2021   Gout due to renal impairment 10/17/2021   Pancytopenia (HCC) 08/05/2021   SIRS (systemic inflammatory response syndrome) (HCC) 08/04/2021   Chronic kidney disease, stage 3b (HCC) 12/30/2020   Recurrent falls    Spinal stenosis    S/P TKR (total knee replacement), bilateral    Arthralgia 11/20/2019   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   Lesion of liver 04/20/2019   Secondary hypercoagulable state 04/09/2019   Eosinophil count raised 02/17/2019   Acute tracheobronchitis 01/05/2019   Asthma    Hyponatremia 12/17/2018   Vitamin D  deficiency 07/25/2018    Chronic anticoagulation 03/06/2018   Hyperlipidemia associated with type 2 diabetes mellitus (HCC) 01/28/2018   Basal cell carcinoma (BCC) of left side of nose 01/28/2018   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   Artificial knee joint present 05/20/2017   Left knee pain 05/07/2017   B12 deficiency 10/22/2016   Overweight (BMI 25.0-29.9) 10/22/2016   Primary osteoarthritis of right knee 03/29/2015   Osteoarthritis of right knee 03/29/2015   History of cholecystectomy 11/20/2014   Occult blood in stools 02/09/2014   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   Paroxysmal A-fib (HCC) 04/16/2013   Chronic heart failure with preserved ejection fraction (HFpEF) (HCC) 04/16/2013   Colon polyps 04/16/2013   NIDDM-2 with polyneuropathy and hyperglycemia 04/16/2013   Tinea cruris 04/16/2013   Diabetic neuropathy (HCC) 07/14/2012   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) 07/14/2012   Insomnia 03/04/2012   History of cardiovascular disorder 08/16/2011   Hypertensive heart disease with heart failure (HCC) 01/06/2010   Neoplasm of prostate, malignant (HCC) 01/06/2010   Hypercholesterolemia 01/06/2010   Essential hypertension 01/06/2010    Orientation RESPIRATION BLADDER Height & Weight     Self, Time, Situation, Place  Normal Continent Weight: 79.3 kg Height:  5' 8 (172.7 cm)  BEHAVIORAL SYMPTOMS/MOOD NEUROLOGICAL BOWEL NUTRITION STATUS      Continent Diet (Regular)  AMBULATORY STATUS COMMUNICATION OF NEEDS Skin   Limited Assist Verbally PU Stage and Appropriate Care, Other (Comment) (sacral woud care see d/c summary;PICC ine-iv abx long term till 04/10/24) PU Stage 1 Dressing: Daily  Personal Care Assistance Level of Assistance  Bathing, Feeding, Dressing Bathing Assistance: Limited assistance Feeding assistance: Limited assistance Dressing Assistance: Limited assistance     Functional Limitations Info  Sight, Hearing, Speech Sight Info:  Adequate Hearing Info: Adequate Speech Info: Adequate    SPECIAL CARE FACTORS FREQUENCY  PT (By licensed PT), OT (By licensed OT)     PT Frequency: 5x week OT Frequency: 5x week            Contractures Contractures Info: Not present    Additional Factors Info  Code Status, Allergies Code Status Info: Full Allergies Info: Amlodipine , Ativan  (Lorazepam ), Chlorhexidine  Gluconate           Current Medications (03/04/2024):  This is the current hospital active medication list Current Facility-Administered Medications  Medication Dose Route Frequency Provider Last Rate Last Admin   acetaminophen  (TYLENOL ) tablet 650 mg  650 mg Oral Q6H PRN Opyd, Timothy S, MD   650 mg at 03/03/24 0932   Or   acetaminophen  (TYLENOL ) suppository 650 mg  650 mg Rectal Q6H PRN Opyd, Timothy S, MD       allopurinol  (ZYLOPRIM ) tablet 300 mg  300 mg Oral Daily Barbarann Nest, MD   300 mg at 03/04/24 9051   apixaban  (ELIQUIS ) tablet 5 mg  5 mg Oral BID Sreeram, Narendranath, MD   5 mg at 03/04/24 0948   atorvastatin  (LIPITOR) tablet 20 mg  20 mg Oral QHS Crozier, Peyton, PA-C   20 mg at 03/03/24 2108   bisacodyl  (DULCOLAX) EC tablet 5 mg  5 mg Oral Daily PRN Opyd, Timothy S, MD       carvedilol  (COREG ) tablet 3.125 mg  3.125 mg Oral BID Crozier, Peyton, PA-C   3.125 mg at 03/04/24 9050   ceFEPIme  (MAXIPIME ) 2 g in sodium chloride  0.9 % 100 mL IVPB  2 g Intravenous Q8H Vu, Trung T, MD 200 mL/hr at 03/04/24 1509 2 g at 03/04/24 1509   Chlorhexidine  Gluconate Cloth 2 % PADS 6 each  6 each Topical Daily Barbarann Nest, MD   6 each at 03/04/24 0949   cyclobenzaprine  (FLEXERIL ) tablet 10 mg  10 mg Oral TID PRN Crozier, Peyton, PA-C   10 mg at 02/28/24 2208   cycloSPORINE  (RESTASIS ) 0.05 % ophthalmic emulsion 1 drop  1 drop Both Eyes Daily Opyd, Timothy S, MD   1 drop at 03/04/24 0950   DAPTOmycin  (CUBICIN ) 600 mg in sodium chloride  0.9 % IVPB  8 mg/kg Intravenous Q1400 Vu, Trung T, MD 124 mL/hr at 03/04/24 1431  600 mg at 03/04/24 1431   flecainide  (TAMBOCOR ) tablet 100 mg  100 mg Oral BID Crozier, Peyton, PA-C   100 mg at 03/04/24 9050   folic acid  (FOLVITE ) tablet 1 mg  1 mg Oral Daily Crozier, Peyton, PA-C   1 mg at 03/04/24 9052   gabapentin  (NEURONTIN ) capsule 300 mg  300 mg Oral Daily Crozier, Peyton, PA-C   300 mg at 03/04/24 9051   gabapentin  (NEURONTIN ) capsule 600 mg  600 mg Oral QPM Crozier, Peyton, PA-C   600 mg at 03/03/24 2108   hydrALAZINE  (APRESOLINE ) tablet 25 mg  25 mg Oral BID Crozier, Peyton, PA-C   25 mg at 03/04/24 9051   hydrocortisone  cream 1 %   Topical TID Jillian Buttery, MD   Given at 02/29/24 2135   insulin  aspart (novoLOG ) injection 0-6 Units  0-6 Units Subcutaneous TID AC & HS Barbarann Nest, MD       montelukast  (SINGULAIR ) tablet 10  mg  10 mg Oral QHS Crozier, Peyton, PA-C   10 mg at 03/03/24 2108   Oral care mouth rinse  15 mL Mouth Rinse PRN Barbarann Nest, MD       senna-docusate (Senokot-S) tablet 1 tablet  1 tablet Oral QHS PRN Opyd, Timothy S, MD       sodium chloride  flush (NS) 0.9 % injection 10-40 mL  10-40 mL Intracatheter PRN Opyd, Timothy S, MD       sodium chloride  flush (NS) 0.9 % injection 10-40 mL  10-40 mL Intracatheter PRN Barbarann Nest, MD       sodium chloride  flush (NS) 0.9 % injection 3 mL  3 mL Intravenous Q12H Opyd, Timothy S, MD   3 mL at 02/29/24 0915   sodium chloride  tablet 1 g  1 g Oral TID WC Barbarann Nest, MD   1 g at 03/04/24 1242   tamsulosin  (FLOMAX ) capsule 0.4 mg  0.4 mg Oral QPC supper Barbarann Nest, MD   0.4 mg at 03/03/24 8177   thiamine  (VITAMIN B1) tablet 100 mg  100 mg Oral Daily Jillian Buttery, MD   100 mg at 03/04/24 9051   torsemide  (DEMADEX ) tablet 20 mg  20 mg Oral BID Cheryle Page, MD   20 mg at 03/04/24 1242     Discharge Medications: Please see discharge summary for a list of discharge medications.  Relevant Imaging Results:  Relevant Lab Results:   Additional Information SS#[id number]  Marilee Ditommaso,  Nathanel, RN     "

## 2024-03-04 NOTE — Progress Notes (Signed)
 " PROGRESS NOTE    Charles Lloyd.  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 He was found to have a 3.5 x 2.4 cm L psoas muscle abscess. Neurosurgery recommended IR drainage and IV antibiotics.  Underwent CT-guided aspiration on 02/24/24. Developed hospital-associated delirium requiring ICU transfer, now improved.  Currently on daptomycin  and cefepime  till 04/13/2024 as per ID recommendations.  Currently medically stable for discharge.  Awaiting CIR versus rehab.  Assessment & Plan:   Left psoas abscess -Had recent transverse lumbar interbody fusion on 02/06/2024. -S/p IR CT guided aspiration of left psoas fluid collection, drain placed.  No growth on fluid and blood cultures - Currently on daptomycin  and cefepime  till 04/13/2024 as per ID recommendations.  Currently has a PICC. -Currently medically stable for discharge.  Awaiting CIR versus rehab.  Delirium Alcohol withdrawal syndrome with delirium Possible underlying dementia -MRI brain unremarkable -Off precedex  drip.  Completed phenobarbital  taper - Mental status improved.  Continue delirium precautions and frequent reorientation. - Avoid Ativan : Son was concerned about paradoxical response to Ativan .  Currently added as an allergy  Paroxysmal A-fib - Currently rate controlled.  Continue flecainide , Coreg  and Eliquis   Chronic diastolic heart failure Hypertension hyperlipidemia - Compensated.  Continue strict input and output.  Daily weights.  Continue hydralazine , Coreg .  Might have to resume torsemide .  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  Blood sugars currently stable.  Continue CBGs with SSI.  Will not continue insulin   at discharge.  Continue gabapentin   Asthma - No exacerbation.  Continue montelukast   Recurrent falls -Awaiting CIR versus rehab.  Wound 02/24/24 1245 Pressure Injury Buttocks Mid;Lower Deep Tissue Pressure Injury - Purple or maroon localized area of discolored intact skin or blood-filled blister due to damage of underlying soft tissue from pressure and/or shear. (Active)     Wound 02/24/24 1245 Pressure Injury Scrotum Circumferential Deep Tissue Pressure Injury - Purple or maroon localized area of discolored intact skin or blood-filled blister due to damage of underlying soft tissue from pressure and/or shear. (Active)  Present to admission: Continue local wound care  Acute on chronic urinary retention - Required Foley catheter during this hospitalization.  Will give voiding trial today.  Continue Flomax .  Outpatient follow-up with urology  Hyponatremia -Worsening.  Resume diuretic.  Continue salt tablets.  Monitor  Hypokalemia - Mild.  Replace.  Repeat a.m. labs  Thrombocytopenia -Questionable cause.  No signs of bleeding.  Monitor  DVT prophylaxis: Eliquis  Code Status: Full Family Communication: Son at bedside Disposition Plan: Status is: Inpatient Remains inpatient appropriate because: Of severity of illness    Consultants: Neurosurgery/IR/PCCM/ID  Procedures: As above  Antimicrobials:  Anti-infectives (From admission, onward)    Start     Dose/Rate Route Frequency Ordered Stop   02/28/24 2200  ceFEPIme  (MAXIPIME ) 2 g in sodium chloride  0.9 % 100 mL IVPB        2 g 200 mL/hr over 30 Minutes Intravenous Every 8 hours 02/28/24 1440     02/28/24 1530  DAPTOmycin  (CUBICIN ) 600 mg in sodium chloride  0.9 % IVPB        8 mg/kg  79.3 kg 124 mL/hr over 30 Minutes Intravenous Daily 02/28/24 1440     02/28/24 0000  daptomycin  (CUBICIN ) IVPB        600 mg Intravenous Every 24 hours 02/28/24 1444 04/10/24 2359   02/28/24 0000  ceFEPime  (MAXIPIME ) IVPB        2 g Intravenous  Every 8 hours 02/28/24 1444 04/10/24 2359   02/23/24 2200  vancomycin  (VANCOREADY) IVPB 1500 mg/300 mL  Status:  Discontinued        1,500 mg 150 mL/hr over 120 Minutes Intravenous Every 24 hours 02/22/24 2348 02/23/24 0242   02/23/24 1000  linezolid  (ZYVOX ) IVPB 600 mg  Status:  Discontinued       Note to Pharmacy: Pt developed rash shortly after vancomycin  administration.   600 mg 300 mL/hr over 60 Minutes Intravenous Every 12 hours 02/23/24 0242 02/28/24 1440   02/23/24 0800  piperacillin -tazobactam (ZOSYN ) IVPB 3.375 g  Status:  Discontinued        3.375 g 12.5 mL/hr over 240 Minutes Intravenous Every 8 hours 02/22/24 2348 02/28/24 1440   02/22/24 2315  piperacillin -tazobactam (ZOSYN ) IVPB 3.375 g        3.375 g 100 mL/hr over 30 Minutes Intravenous  Once 02/22/24 2306 02/23/24 0013   02/22/24 2315  vancomycin  (VANCOREADY) IVPB 1500 mg/300 mL        1,500 mg 150 mL/hr over 120 Minutes Intravenous  Once 02/22/24 2306 02/23/24 0141        Subjective: Patient seen and examined at bedside.  Awake but confused.  Son at bedside.  No fever, seizures or agitation reported.  Objective: Vitals:   03/03/24 1308 03/03/24 2031 03/04/24 0559 03/04/24 0948  BP: (!) 122/52 (!) 153/76 134/60 122/66  Pulse: 70 91 79 73  Resp: 18 18 19    Temp: (!) 97.5 F (36.4 C) 98.1 F (36.7 C) 98 F (36.7 C)   TempSrc: Oral Oral Oral   SpO2: 99% 100% 97%   Weight:      Height:        Intake/Output Summary (Last 24 hours) at 03/04/2024 1022 Last data filed at 03/04/2024 0917 Gross per 24 hour  Intake --  Output 800 ml  Net -800 ml   Filed Weights   02/22/24 1238 02/24/24 1245  Weight: 79.4 kg 79.3 kg    Examination:  General exam: Appears calm and comfortable.  Elderly male sitting in chair. Respiratory system: Bilateral decreased breath sounds at bases Cardiovascular system: S1 & S2 heard, Rate controlled Gastrointestinal system: Abdomen is nondistended, soft and nontender. Normal bowel  sounds heard. Extremities: No cyanosis, clubbing, edema  Central nervous system: Awake but confused.  No focal neurological deficits. Moving extremities Skin: No rashes, lesions or ulcers Psychiatry: Flat affect. Not agitated.    Data Reviewed: I have personally reviewed following labs and imaging studies  CBC: Recent Labs  Lab 02/28/24 0243 02/29/24 0300 03/02/24 1059 03/03/24 0950 03/04/24 0408  WBC 3.6* 3.6* 4.8 4.7 4.3  HGB 9.3* 9.1* 9.7* 9.2* 8.1*  HCT 27.8* 27.3* 28.8* 26.8* 24.5*  MCV 86.9 88.1 88.1 86.7 88.1  PLT 197 183 104* 90* 80*   Basic Metabolic Panel: Recent Labs  Lab 02/28/24 0243 02/29/24 0300 03/02/24 1059 03/03/24 0950 03/04/24 0408  NA 127* 128* 128* 126* 125*  K 3.6 3.6 3.3* 3.5 3.3*  CL 98 98 97* 97* 96*  CO2 20* 22 19* 20* 21*  GLUCOSE 105* 94 128* 104* 90  BUN 6* 6* 11 11 10   CREATININE 0.65 0.78 0.69 0.74 0.75  CALCIUM  8.4* 8.6* 8.6* 8.8* 8.4*   GFR: Estimated Creatinine  Clearance: 70.1 mL/min (by C-G formula based on SCr of 0.75 mg/dL). Liver Function Tests: No results for input(s): AST, ALT, ALKPHOS, BILITOT, PROT, ALBUMIN  in the last 168 hours. No results for input(s): LIPASE, AMYLASE in the last 168 hours. No results for input(s): AMMONIA in the last 168 hours. Coagulation Profile: No results for input(s): INR, PROTIME in the last 168 hours. Cardiac Enzymes: Recent Labs  Lab 02/29/24 0300  CKTOTAL 26*   BNP (last 3 results) No results for input(s): PROBNP in the last 8760 hours. HbA1C: No results for input(s): HGBA1C in the last 72 hours. CBG: Recent Labs  Lab 03/03/24 0740 03/03/24 1118 03/03/24 1609 03/03/24 2029 03/04/24 0728  GLUCAP 91 91 108* 110* 96   Lipid Profile: No results for input(s): CHOL, HDL, LDLCALC, TRIG, CHOLHDL, LDLDIRECT in the last 72 hours. Thyroid  Function Tests: No results for input(s): TSH, T4TOTAL, FREET4, T3FREE, THYROIDAB in the last 72  hours. Anemia Panel: No results for input(s): VITAMINB12, FOLATE, FERRITIN, TIBC, IRON, RETICCTPCT in the last 72 hours. Sepsis Labs: No results for input(s): PROCALCITON, LATICACIDVEN in the last 168 hours.  Recent Results (from the past 240 hours)  Aerobic/Anaerobic Culture w Gram Stain (surgical/deep wound)     Status: None   Collection Time: 02/24/24  4:37 PM   Specimen: Body Fluid  Result Value Ref Range Status   Specimen Description   Final    FLUID Performed at [facility], [address], [place], [place] [address]    Special Requests   Final    NONE Performed at [facility], [address] [place], [place] [address]    Gram Stain   Final    FEW WBC PRESENT, PREDOMINANTLY PMN NO ORGANISMS SEEN    Culture   Final    No growth aerobically or anaerobically. Performed at [facility], [address] [place], [place] [address]    Report Status 02/29/2024 FINAL  Final         Radiology Studies: No results found.      Scheduled Meds:  allopurinol   300 mg Oral Daily   apixaban   5 mg Oral BID   atorvastatin   20 mg Oral QHS   carvedilol   3.125 mg Oral BID   Chlorhexidine  Gluconate Cloth  6 each Topical Daily   cycloSPORINE   1 drop Both Eyes Daily   flecainide   100 mg Oral BID   folic acid   1 mg Oral Daily   gabapentin   300 mg Oral Daily   gabapentin   600 mg Oral QPM   hydrALAZINE   25 mg Oral BID   hydrocortisone  cream   Topical TID   insulin  aspart  0-6 Units Subcutaneous TID AC & HS   montelukast   10 mg Oral QHS   sodium chloride  flush  3 mL Intravenous Q12H   sodium chloride   1 g Oral TID WC   tamsulosin   0.4 mg Oral QPC supper   thiamine   100 mg Oral Daily   Or   thiamine   100 mg Intravenous Daily   Continuous Infusions:  ceFEPime  (MAXIPIME ) IV 2 g (03/04/24 0514)   DAPTOmycin  600 mg (03/03/24 1549)          Sophie Mao, MD [facility] 03/04/2024, 10:22 AM   "

## 2024-03-04 NOTE — Progress Notes (Signed)
" ° °  Inpatient Rehabilitation Admissions Coordinator   I await insurance determination for possible CIR admit.  Heron Leavell, RN, MSN Rehab Admissions Coordinator [phone] 03/04/2024 8:27 AM  "

## 2024-03-04 NOTE — Progress Notes (Signed)
 Occupational Therapy Treatment Patient Details Name: Charles Lloyd. MRN: [id number] DOB: [birth year] Today's Date: 03/04/2024   History of present illness Charles Van. is a 84 yr old male who presented to the emergency department on 02/22/2024 for leg pain and weakness, with family reporting multiple falls over the past 6 to 8 months.  MRI lumbar spine revealed a new 3.5 x 2.4 cm fluid collection in the left psoas muscle concerning for abscess.  Neurosurgery was consulted at that time and recommended IV antibiotics and possible aspiration of abscess. PMH of hypertension, hyperlipidemia, type 2 diabetes, atrial fibrillation on anticoagulation, s/p recent L4-5 trans psoas lumbar interbody fusion left-sided approach on 02/06/2024, CHF,gout,CAD, TKA with infection, neuropathy   OT comments  The pt required CGA to perform supine to sit. Once seated EOB, he required SBA for upper body dressing. Mod assist was needed for sit to stand using a RW, with the pt presenting with noted unsteadiness in standing, with him subsequently requiring mod assist to take lateral steps along the EOB. He continues to require up to moderate assistance for performing lower body dressing tasks. Continue OT plan of care to address his functional limitations and impaired ADL performance. He has excellent rehab potential and he is motivated to be regain functional independence. Patient will benefit from intensive inpatient follow-up therapy, >3 hours/day.       If plan is discharge home, recommend the following:  A lot of help with walking and/or transfers;A lot of help with bathing/dressing/bathroom;Help with stairs or ramp for entrance;Assistance with cooking/housework;Assist for transportation;Direct supervision/assist for medications management   Equipment Recommendations  Other (comment) (defer to next level of care/setting)    Recommendations for Other Services      Precautions / Restrictions  Precautions Precautions: Fall Restrictions Weight Bearing Restrictions Per Provider Order: No       Mobility Bed Mobility Overal bed mobility: Needs Assistance       Supine to sit: Contact guard, Used rails, HOB elevated Sit to supine: Contact guard assist, Used rails        Transfers Overall transfer level: Needs assistance Equipment used: Rolling walker (2 wheels) Transfers: Sit to/from Stand Sit to Stand: Mod assist, From elevated surface           General transfer comment: He required cues for upright posture in standing and to push through walker for support. He was then instructed on lateral stepping towards the head of the bed, for which he required min-mod assist with cues for walker advancement.     Balance       Sitting balance - Comments: static sitting-good. dynamic sitting-fair+     Standing balance-Leahy Scale: Poor                ADL either performed or assessed with clinical judgement   ADL Overall ADL's : Needs assistance/impaired                 Upper Body Dressing : Set up;Supervision/safety;Sitting Upper Body Dressing Details (indicate cue type and reason): The pt doffed a hospital gown seated EOB, then donned another clean one. Lower Body Dressing: Moderate assistance;Sitting/lateral leans Lower Body Dressing Details (indicate cue type and reason): Pt has some difficulty with performing lower body dressing tasks, specific difficult with donning/doffing socks, which pt reported has been an issue for him recently.                      Cognition Arousal: Alert  Behavior During Therapy: [facility] for tasks assessed/performed         Memory impairment (select all impairments): Short-term memory Attention impairment (select first level of impairment): Divided attention Executive functioning impairment (select all impairments): Problem solving OT - Cognition Comments: difficulty with higher level problem solving noted                  Following commands: Intact Following commands impaired: Only follows one step commands consistently      Cueing   Cueing Techniques: Verbal cues, Gestural cues             Pertinent Vitals/ Pain       Pain Assessment Pain Assessment: No/denies pain   Frequency  Min 2X/week        Progress Toward Goals  OT Goals(current goals can now be found in the care plan section)  Progress towards OT goals: Progressing toward goals  Acute Rehab OT Goals Patient Stated Goal: to be as independent as possible OT Goal Formulation: With patient Time For Goal Achievement: 03/12/24 Potential to Achieve Goals: Good  Plan         AM-PAC OT 6 Clicks Daily Activity     Outcome Measure   Help from another person eating meals?: None Help from another person taking care of personal grooming?: A Little Help from another person toileting, which includes using toliet, bedpan, or urinal?: A Lot Help from another person bathing (including washing, rinsing, drying)?: A Lot Help from another person to put on and taking off regular upper body clothing?: A Little Help from another person to put on and taking off regular lower body clothing?: A Lot 6 Click Score: 16    End of Session Equipment Utilized During Treatment: Rolling walker (2 wheels)  OT Visit Diagnosis: Unsteadiness on feet (R26.81);Other abnormalities of gait and mobility (R26.89);Muscle weakness (generalized) (M62.81)   Activity Tolerance Patient tolerated treatment well   Patient Left in bed;with call bell/phone within reach;with bed alarm set   Nurse Communication Mobility status        Time: 8370-8357 OT Time Calculation (min): 13 min  Charges: OT General Charges $OT Visit: 1 Visit OT Treatments $Therapeutic Activity: 8-22 mins   Delanna JINNY Lesches, OTR/L 03/04/2024, 6:00 PM

## 2024-03-04 NOTE — Progress Notes (Signed)
 Physical Therapy Treatment Patient Details Name: Charles Lloyd. MRN: [id number] DOB: 05-30-1940 Today's Date: 03/04/2024   History of Present Illness Charles Lloyd. is a 84 y.o. male who presented to the emergency department on 02/22/2024 for leg pain and weakness, with family reporting multiple falls over the past 6 to 8 months.  MRI lumbar spine revealed a new 3.5 x 2.4 cm fluid collection in the left psoas muscle concerning for abscess.  Neurosurgery was consulted at that time and recommended IV antibiotics and possible aspiration of abscess.  with significant PMH of hypertension, hyperlipidemia, type 2 diabetes, atrial fibrillation on anticoagulation, s/p recent L4-5 trans psoas lumbar interbody fusion left-sided approach on 02/06/2024, CHF,gout,CAD, TKA with infection, neuropathy    PT Comments  Pt agreeable to working with therapy with some encouragement. He expresses some reservation/fear of falling-reassured pt that we would keep him safe. He required Mod A +2 to rise from low recliner and Min A for ambulation safety. LEs remain weak with some ataxia observed during ambulation. He is at risk from falls when mobilizing. Awaiting decision from insurance company for potential AIR placement. If AIR not an option, pt will need SNF for ST rehab.     If plan is discharge home, recommend the following: Assist for transportation;Help with stairs or ramp for entrance;Assistance with cooking/housework;A lot of help with bathing/dressing/bathroom;Two people to help with walking and/or transfers   Can travel by private vehicle        Equipment Recommendations  None recommended by PT    Recommendations for Other Services       Precautions / Restrictions Precautions Precautions: Fall Recall of Precautions/Restrictions: Impaired Precaution/Restrictions Comments: trunk flexion at baseline Restrictions Weight Bearing Restrictions Per Provider Order: No     Mobility  Bed Mobility                General bed mobility comments: pt in recliner upon my arrival. pt appeared to have scooted himself down in recliner to where his buttocks was at the edge of the seat almost to top of leg rest. +2 assist to slide pt up in recliner (with assist from NT) prior to standing.    Transfers Overall transfer level: Needs assistance Equipment used: Rolling walker (2 wheels) Transfers: Sit to/from Stand Sit to Stand: Mod assist, +2 physical assistance, +2 safety/equipment           General transfer comment: x2 from low recliner. Cues for safety, technique, hand/feet placement. Blocked feet from shooting outwards during transiton. Posterior bias with poor trunk flexion and anterior weight shift. Assist to power up, stabilize, conrol descent.    Ambulation/Gait Ambulation/Gait assistance: Min assist, +2 physical assistance, +2 safety/equipment Gait Distance (Feet): 10 Feet Assistive device: Rolling walker (2 wheels) Gait Pattern/deviations: Step-through pattern, Decreased stride length, Trunk flexed, Ataxic       General Gait Details: Assist to stabilize pt and follow closely with recliner. No knee buckling occurred with short distance but there was some ataxia observed. Followed closely with recliner. Cues for safety, Rw proximity, posture. Fall risk.   Stairs             Wheelchair Mobility     Tilt Bed    Modified Rankin (Stroke Patients Only)       Balance Overall balance assessment: Needs assistance, History of Falls   Sitting balance-Leahy Scale: Fair     Standing balance support: Bilateral upper extremity supported, During functional activity, Reliant on assistive device for balance Standing  balance-Leahy Scale: Poor                              Communication Communication Communication: Impaired Factors Affecting Communication: Difficulty expressing self  Cognition Arousal: Alert Behavior During Therapy: WFL for tasks  assessed/performed   PT - Cognitive impairments: Safety/Judgement, Sequencing, Problem solving                       PT - Cognition Comments: appears to have some general confusion   Following commands impaired: Follows one step commands with increased time    Cueing Cueing Techniques: Verbal cues, Tactile cues  Exercises      General Comments        Pertinent Vitals/Pain Pain Assessment Pain Assessment: Faces Faces Pain Scale: Hurts little more Pain Location: generalized back, B LE Pain Descriptors / Indicators: Aching, Discomfort, Dull Pain Intervention(s): Monitored during session, Limited activity within patient's tolerance, Repositioned    Home Living                          Prior Function            PT Goals (current goals can now be found in the care plan section) Progress towards PT goals: Progressing toward goals    Frequency    Min 3X/week      PT Plan      Co-evaluation              AM-PAC PT 6 Clicks Mobility   Outcome Measure  Help needed turning from your back to your side while in a flat bed without using bedrails?: A Lot Help needed moving from lying on your back to sitting on the side of a flat bed without using bedrails?: A Lot Help needed moving to and from a bed to a chair (including a wheelchair)?: A Lot Help needed standing up from a chair using your arms (e.g., wheelchair or bedside chair)?: A Lot Help needed to walk in hospital room?: A Lot Help needed climbing 3-5 steps with a railing? : Total 6 Click Score: 11    End of Session Equipment Utilized During Treatment: Gait belt Activity Tolerance: Patient tolerated treatment well Patient left: in chair;with call bell/phone within reach;with chair alarm set   PT Visit Diagnosis: Muscle weakness (generalized) (M62.81);Difficulty in walking, not elsewhere classified (R26.2) Pain - part of body: Leg;Knee (back)     Time: 8877-8861 PT Time Calculation  (min) (ACUTE ONLY): 16 min  Charges:    $Gait Training: 8-22 mins PT General Charges $$ ACUTE PT VISIT: 1 Visit                         Dannial SQUIBB, PT Acute Rehabilitation  Office: [phone]

## 2024-03-04 NOTE — Progress Notes (Signed)
" ° °  Inpatient Rehabilitation Admissions Coordinator   I notified Dr Cheryle and [facility] RN CM that [facility] medicare MD is requesting peer to peer discussion with treating MD. Details on peer to peer given. I await [employer] medicare determination.  Heron Leavell, RN, MSN Rehab Admissions Coordinator [phone] 03/04/2024 12:38 PM  "

## 2024-03-05 DIAGNOSIS — K6812 Psoas muscle abscess: Secondary | ICD-10-CM | POA: Diagnosis not present

## 2024-03-05 LAB — CBC WITH DIFFERENTIAL/PLATELET
Abs Immature Granulocytes: 0.03 K/uL (ref 0.00–0.07)
Basophils Absolute: 0 K/uL (ref 0.0–0.1)
Basophils Relative: 1 %
Eosinophils Absolute: 0.5 K/uL (ref 0.0–0.5)
Eosinophils Relative: 12 %
HCT: 26.7 % — ABNORMAL LOW (ref 39.0–52.0)
Hemoglobin: 9.2 g/dL — ABNORMAL LOW (ref 13.0–17.0)
Immature Granulocytes: 1 %
Lymphocytes Relative: 22 %
Lymphs Abs: 1 K/uL (ref 0.7–4.0)
MCH: 29.6 pg (ref 26.0–34.0)
MCHC: 34.5 g/dL (ref 30.0–36.0)
MCV: 85.9 fL (ref 80.0–100.0)
Monocytes Absolute: 0.6 K/uL (ref 0.1–1.0)
Monocytes Relative: 13 %
Neutro Abs: 2.3 K/uL (ref 1.7–7.7)
Neutrophils Relative %: 51 %
Platelets: 95 K/uL — ABNORMAL LOW (ref 150–400)
RBC: 3.11 MIL/uL — ABNORMAL LOW (ref 4.22–5.81)
RDW: 16.4 % — ABNORMAL HIGH (ref 11.5–15.5)
WBC: 4.4 K/uL (ref 4.0–10.5)
nRBC: 0 % (ref 0.0–0.2)

## 2024-03-05 LAB — GLUCOSE, CAPILLARY
Glucose-Capillary: 89 mg/dL (ref 70–99)
Glucose-Capillary: 160 mg/dL — ABNORMAL HIGH (ref 70–99)
Glucose-Capillary: 165 mg/dL — ABNORMAL HIGH (ref 70–99)
Glucose-Capillary: 125 mg/dL — ABNORMAL HIGH (ref 70–99)

## 2024-03-05 LAB — BASIC METABOLIC PANEL WITH GFR
Anion gap: 10 (ref 5–15)
BUN: 11 mg/dL (ref 8–23)
CO2: 23 mmol/L (ref 22–32)
Calcium: 9.3 mg/dL (ref 8.9–10.3)
Chloride: 94 mmol/L — ABNORMAL LOW (ref 98–111)
Creatinine, Ser: 0.9 mg/dL (ref 0.61–1.24)
GFR, Estimated: >60 mL/min
Glucose, Bld: 108 mg/dL — ABNORMAL HIGH (ref 70–99)
Potassium: 3.3 mmol/L — ABNORMAL LOW (ref 3.5–5.1)
Sodium: 126 mmol/L — ABNORMAL LOW (ref 135–145)

## 2024-03-05 LAB — MAGNESIUM: Magnesium: 1.6 mg/dL — ABNORMAL LOW (ref 1.7–2.4)

## 2024-03-05 MED ORDER — POTASSIUM CHLORIDE CRYS ER 20 MEQ PO TBCR
40.0000 meq | EXTENDED_RELEASE_TABLET | Freq: Once | ORAL | Status: AC
  Administered 2024-03-05: 40 meq via ORAL
  Filled 2024-03-05: qty 2

## 2024-03-05 MED ORDER — SODIUM CHLORIDE 1 G PO TABS
2.0000 g | ORAL_TABLET | Freq: Three times a day (TID) | ORAL | Status: DC
  Administered 2024-03-05 (×2): 2 g via ORAL
  Filled 2024-03-05 (×2): qty 2

## 2024-03-05 MED ORDER — MAGNESIUM SULFATE 2 GM/50ML IV SOLN
2.0000 g | Freq: Once | INTRAVENOUS | Status: AC
  Administered 2024-03-05: 2 g via INTRAVENOUS
  Filled 2024-03-05: qty 50

## 2024-03-05 NOTE — Progress Notes (Signed)
 Assessment 84 y/o M w/ hx Afib on Eliquis  who is 4 weeks s/p L4-5 PLLIF for the treatment of spondylolisthesis. Presented after a fall with imaging showing psoas fluid collection along the surgical approach. Ddx includes abscess (although Cx negative) vs hematoma vs seroma. Being treated for abscess with antibiotics  LOS: 12 days    Plan: Defer antibiotics to primary/ID Ok to discharge from my standpoint Will see him in a couple weeks in clinic [facility] for Eliquis   Subjective: Slightly confused with conversation. Mild back soreness. Denies leg pain  Objective: Vital signs in last 24 hours: Temp:  [97.6 F (36.4 C)-97.9 F (36.6 C)] 97.9 F (36.6 C) (01/08 0533) Pulse Rate:  [72-85] 85 (01/08 0533) Resp:  [14-18] 16 (01/08 0533) BP: (121-152)/(62-72) 123/62 (01/08 0533) SpO2:  [98 %-100 %] 98 % (01/08 0533)  Intake/Output from previous day: 01/07 0701 - 01/08 0700 In: 788 [P.O.:240; IV Piggyback:548] Out: 6700 [Urine:6700] Intake/Output this shift: Total I/O In: -  Out: 800 [Urine:800]  Exam: Aox3 but slightly confused with conversation MAEx4 Flank and posterior incisions healing well. No erythema/fluid/tenderness  Lab Results: Recent Labs    03/04/24 0408 03/05/24 0306  WBC 4.3 4.4  HGB 8.1* 9.2*  HCT 24.5* 26.7*  PLT 80* 95*   BMET Recent Labs    03/04/24 0408 03/05/24 0306  NA 125* 126*  K 3.3* 3.3*  CL 96* 94*  CO2 21* 23  GLUCOSE 90 108*  BUN 10 11  CREATININE 0.75 0.90  CALCIUM  8.4* 9.3       Charles Lloyd 03/05/2024, 8:07 AM

## 2024-03-05 NOTE — Progress Notes (Signed)
 Occupational Therapy Treatment Patient Details Name: Charles Lloyd. MRN: [id number] DOB: 25-Dec-1940 Today's Date: 03/05/2024   History of present illness Charles Lloyd. is a 84 yr old male who presented to the emergency department on 02/22/2024 for leg pain and weakness, with family reporting multiple falls over the past 6 to 8 months.  MRI lumbar spine revealed a new 3.5 x 2.4 cm fluid collection in the left psoas muscle concerning for abscess.  Neurosurgery was consulted at that time and recommended IV antibiotics and possible aspiration of abscess.  PMH of hypertension, hyperlipidemia, type 2 diabetes, atrial fibrillation on anticoagulation, s/p recent L4-5 trans psoas lumbar interbody fusion left-sided approach on 02/06/2024, CHF,gout,CAD, TKA with infection, neuropathy   OT comments  The pt continues to be motivated towards therapy and is making gradual functional progress. He progressed to performing sit to stand using a RW, for taking a few steps to the chair using a RW, and for performing upper body grooming seated in the bedside chair. He is requiring mod assist for lower body dressing. Continue OT plan of care. Patient will benefit from intensive inpatient follow-up therapy, >3 hours/day.       If plan is discharge home, recommend the following:  A lot of help with walking and/or transfers;A lot of help with bathing/dressing/bathroom;Help with stairs or ramp for entrance;Assistance with cooking/housework;Assist for transportation;Direct supervision/assist for medications management   Equipment Recommendations  Other (comment) (defer to next setting)    Recommendations for Other Services      Precautions / Restrictions Precautions Precautions: Fall Restrictions Weight Bearing Restrictions Per Provider Order: No       Mobility Bed Mobility Overal bed mobility: Needs Assistance Bed Mobility: Supine to Sit     Supine to sit: Min assist, HOB elevated, Used rails           Transfers Overall transfer level: Needs assistance Equipment used: Rolling walker (2 wheels) Transfers: Sit to/from Stand Sit to Stand: Mod assist     Step pivot transfers: Mod assist     General transfer comment: Pt required occasional cues for walker proximity in relation to his body and to reach back prior to sitting in the chair     Balance       Sitting balance - Comments: static sitting-good. dynamic sitting-fair+       Standing balance comment: min to mod assist with RW          ADL either performed or assessed with clinical judgement   ADL Overall ADL's : Needs assistance/impaired     Grooming: Set up;Sitting Grooming Details (indicate cue type and reason): Pt required set-up assist for hand washing and face washing seated in the bedside chair.             Lower Body Dressing: Moderate assistance;Sitting/lateral leans Lower Body Dressing Details (indicate cue type and reason): for sock management seated EOB                      Cognition Arousal: Alert Behavior During Therapy: WFL for tasks assessed/performed         Memory impairment (select all impairments): Short-term memory Attention impairment (select first level of impairment): Divided attention   OT - Cognition Comments: difficulty with higher level problem solving noted                 Following commands: Intact Following commands impaired: Only follows one step commands consistently  Cueing   Cueing Techniques: Verbal cues, Gestural cues             Pertinent Vitals/ Pain       Pain Assessment Pain Assessment: No/denies pain   Frequency  Min 2X/week        Progress Toward Goals  OT Goals(current goals can now be found in the care plan section)  Progress towards OT goals: Progressing toward goals  Acute Rehab OT Goals Patient Stated Goal: to be as independent as possible OT Goal Formulation: With patient Time For Goal Achievement:  03/12/24 Potential to Achieve Goals: Good  Plan         AM-PAC OT 6 Clicks Daily Activity     Outcome Measure   Help from another person eating meals?: None Help from another person taking care of personal grooming?: A Little Help from another person toileting, which includes using toliet, bedpan, or urinal?: A Lot Help from another person bathing (including washing, rinsing, drying)?: A Lot Help from another person to put on and taking off regular upper body clothing?: A Little Help from another person to put on and taking off regular lower body clothing?: A Lot 6 Click Score: 16    End of Session Equipment Utilized During Treatment: Gait belt;Rolling walker (2 wheels)  OT Visit Diagnosis: Unsteadiness on feet (R26.81);Other abnormalities of gait and mobility (R26.89);Muscle weakness (generalized) (M62.81) Pain - Right/Left: Left Pain - part of body:  (side)   Activity Tolerance Patient tolerated treatment well   Patient Left in chair;with call bell/phone within reach;with chair alarm set   Nurse Communication Mobility status        Time: 8344-8290 OT Time Calculation (min): 14 min  Charges: OT General Charges $OT Visit: 1 Visit OT Treatments $Therapeutic Activity: 8-22 mins    Delanna JINNY Lesches, OTR/L 03/05/2024, 5:56 PM

## 2024-03-05 NOTE — Progress Notes (Signed)
 Mobility Specialist - Progress Note   03/05/24 1328  Mobility  Activity Ambulated with assistance  Level of Assistance Moderate assist, patient does 50-74%  Assistive Device Front wheel walker  Distance Ambulated (ft) 15 ft  Range of Motion/Exercises Active  Activity Response Tolerated well  Mobility visit 1 Mobility  Mobility Specialist Start Time (ACUTE ONLY) 1314  Mobility Specialist Stop Time (ACUTE ONLY) 1328  Mobility Specialist Time Calculation (min) (ACUTE ONLY) 14 min   Pt was found on recliner chair and agreeable to mobilize. Pt did x20 marches in place prior to ambulation. NT assisted with chair follow. Grew fatigued with session and was wheeled back to room on recliner chair. At EOS was left on recliner chair with all needs met. Call bell in reach and chair alarm on.   Erminio Leos,  Mobility Specialist Can be reached via Secure Chat

## 2024-03-05 NOTE — TOC Progression Note (Signed)
 Transition of Care (TOC) - Progression Note    Patient Details  Name: Charles Lloyd. MRN: [id number] Date of Birth: 1941-01-27  Transition of Care [facility]) CM/SW Contact  Valla Pacey, Nathanel, RN Phone Number: 03/05/2024, 3:10 PM  Clinical Narrative: [name](Son) chose [facility]-rep accepted. [name] pi#[phone]. Await auth.      Expected Discharge Plan: Skilled Nursing Facility Barriers to Discharge: Insurance Authorization               Expected Discharge Plan and Services In-house Referral: NA Discharge Planning Services: CM Consult Post Acute Care Choice: Durable Medical Equipment, IP Rehab Living arrangements for the past 2 months: Single Family Home Expected Discharge Date: 03/01/24                                     Social Drivers of Health (SDOH) Interventions SDOH Screenings   Food Insecurity: No Food Insecurity (02/23/2024)  Housing: Low Risk (02/23/2024)  Transportation Needs: No Transportation Needs (02/23/2024)  Utilities: Not At Risk (02/23/2024)  Alcohol  Screen: Low Risk (12/21/2022)  Depression (PHQ2-9): Low Risk (02/11/2024)  Financial Resource Strain: Low Risk (12/21/2022)  Physical Activity: Inactive (12/21/2022)  Social Connections: Socially Isolated (02/23/2024)  Stress: No Stress Concern Present (12/21/2022)  Tobacco Use: Low Risk (02/22/2024)  Health Literacy: Adequate Health Literacy (12/21/2022)    Readmission Risk Interventions    02/26/2024    2:28 PM 08/08/2021   10:00 AM  Readmission Risk Prevention Plan  Transportation Screening Complete Complete  PCP or Specialist Appt within 3-5 Days Complete   HRI or Home Care Consult Complete   Social Work Consult for Recovery Care Planning/Counseling Complete   Palliative Care Screening Not Applicable   Medication Review Oceanographer) Complete Complete  PCP or Specialist appointment within 3-5 days of discharge  Complete  HRI or Home Care Consult  Complete  SW  Recovery Care/Counseling Consult  Not Complete  Palliative Care Screening  Not Applicable  Skilled Nursing Facility  Not Applicable

## 2024-03-05 NOTE — Progress Notes (Signed)
 " PROGRESS NOTE    Charles Lloyd Charles Lloyd.  FMW:[id number] DOB: Dec 12, 1940 DOA: 02/22/2024 PCP: Theophilus Andrews, Tully GRADE, MD   Brief Narrative:  [phone] with h/o HTN, HLD, T2DM, afib on Eliquis , and L4-5 spondylolisthesis s/p fusion on 12/11 who presented with LLE weakness and pain with falls. He was found to have a 3.5 x 2.4 cm L psoas muscle abscess. Neurosurgery recommended IR drainage and IV antibiotics.  Underwent CT-guided aspiration on 02/24/24. Developed hospital-associated delirium requiring ICU transfer, now improved.  Currently on daptomycin  and cefepime  till 04/13/2024 as per ID recommendations.  Currently medically stable for discharge.  Awaiting placement.  Assessment & Plan:   Left psoas abscess -Had recent transverse lumbar interbody fusion on 02/06/2024. -S/p IR CT guided aspiration of left psoas fluid collection, drain placed.  No growth on fluid and blood cultures - Currently on daptomycin  and cefepime  till 04/13/2024 as per ID recommendations.  Currently has a PICC. -Currently medically stable for discharge.  Awaiting placement.  TOC following.  Denied by insurance for CIR placement.  Delirium Alcohol  withdrawal syndrome with delirium Possible underlying dementia -MRI brain unremarkable -Off precedex  drip.  Completed phenobarbital  taper - Mental status improved.  Continue delirium precautions and frequent reorientation. - Avoid Ativan : Son was concerned about paradoxical response to Ativan .  Currently added as an allergy  Paroxysmal A-fib - Currently rate controlled.  Continue flecainide , Coreg  and Eliquis   Chronic diastolic heart failure Hypertension hyperlipidemia - Compensated.  Continue strict input and output.  Daily weights.  Continue hydralazine , Coreg .  Torsemide  resumed on 03/04/2024.  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  Blood sugars currently stable.  Continue  CBGs with SSI.  Will not continue insulin  at discharge.  Continue gabapentin   Asthma - No exacerbation.  Continue montelukast   Recurrent falls -Awaiting rehab placement  Wound 02/24/24 1245 Pressure Injury Buttocks Mid;Lower Deep Tissue Pressure Injury - Purple or maroon localized area of discolored intact skin or blood-filled blister due to damage of underlying soft tissue from pressure and/or shear. (Active)     Wound 02/24/24 1245 Pressure Injury Scrotum Circumferential Deep Tissue Pressure Injury - Purple or maroon localized area of discolored intact skin or blood-filled blister due to damage of underlying soft tissue from pressure and/or shear. (Active)  Present to admission: Continue local wound care  Acute on chronic urinary retention - Failed voiding trial on 03/04/2024 and Foley catheter had to be replaced on 03/04/2024.  Continue Flomax .  Outpatient follow-up with urology  Hyponatremia - Sodium slightly improving to 126 today.  Continue diuretic.  Continue salt tablets: Increase dose.  Monitor  Hypokalemia - Replace.  Repeat a.m. labs  Hypomagnesemia - Replace.  Repeat a.m. labs  Anemia of chronic disease - From chronic illnesses.  Hemoglobin stable.  Monitor intermittently  Thrombocytopenia -Questionable cause.  No signs of bleeding.  Monitor  DVT prophylaxis: Eliquis  Code Status: Full Family Communication: Son at bedside on 03/04/2024 Disposition Plan: Status is: Inpatient Remains inpatient appropriate because: Of severity of illness    Consultants: Neurosurgery/IR/PCCM/ID  Procedures: As above  Antimicrobials:  Anti-infectives (From admission, onward)    Start     Dose/Rate Route Frequency Ordered Stop   02/28/24 2200  ceFEPIme  (MAXIPIME ) 2 g in sodium chloride  0.9 % 100 mL IVPB        2 g 200 mL/hr over 30 Minutes Intravenous Every 8 hours 02/28/24 1440     02/28/24 1530  DAPTOmycin  (CUBICIN ) 600 mg in sodium chloride  0.9 % IVPB        8 mg/kg  79.3  kg 124 mL/hr over 30 Minutes Intravenous Daily 02/28/24 1440     02/28/24 0000  daptomycin  (CUBICIN ) IVPB        600 mg Intravenous Every 24 hours 02/28/24 1444 04/10/24 2359   02/28/24 0000  ceFEPime  (MAXIPIME ) IVPB        2 g Intravenous Every 8 hours 02/28/24 1444 04/10/24 2359   02/23/24 2200  vancomycin  (VANCOREADY) IVPB 1500 mg/300 mL  Status:  Discontinued        1,500 mg 150 mL/hr over 120 Minutes Intravenous Every 24 hours 02/22/24 2348 02/23/24 0242   02/23/24 1000  linezolid  (ZYVOX ) IVPB 600 mg  Status:  Discontinued       Note to Pharmacy: Pt developed rash shortly after vancomycin  administration.   600 mg 300 mL/hr over 60 Minutes Intravenous Every 12 hours 02/23/24 0242 02/28/24 1440   02/23/24 0800  piperacillin -tazobactam (ZOSYN ) IVPB 3.375 g  Status:  Discontinued        3.375 g 12.5 mL/hr over 240 Minutes Intravenous Every 8 hours 02/22/24 2348 02/28/24 1440   02/22/24 2315  piperacillin -tazobactam (ZOSYN ) IVPB 3.375 g        3.375 g 100 mL/hr over 30 Minutes Intravenous  Once 02/22/24 2306 02/23/24 0013   02/22/24 2315  vancomycin  (VANCOREADY) IVPB 1500 mg/300 mL        1,500 mg 150 mL/hr over 120 Minutes Intravenous  Once 02/22/24 2306 02/23/24 0141        Subjective: Patient seen and examined at bedside.  Remains confused.  No agitation, vomiting, seizures reported. Objective: Vitals:   03/04/24 0948 03/04/24 1406 03/04/24 2016 03/05/24 0533  BP: 122/66 121/68 (!) 152/72 123/62  Pulse: 73 72 85 85  Resp:  18 14 16   Temp:  97.7 F (36.5 C) 97.6 F (36.4 C) 97.9 F (36.6 C)  TempSrc:  Oral Oral Oral  SpO2:  100% 98% 98%  Weight:      Height:        Intake/Output Summary (Last 24 hours) at 03/05/2024 0825 Last data filed at 03/05/2024 0800 Gross per 24 hour  Intake 788 ml  Output 7500 ml  Net -6712 ml   Filed Weights   02/22/24 1238 02/24/24 1245  Weight: 79.4 kg 79.3 kg    Examination:  General: On room air.  No distress ENT/neck: No  thyromegaly.  JVD is not elevated  respiratory: Decreased breath sounds at bases bilaterally with some crackles; no wheezing CVS: S1-S2 heard, rate controlled currently Abdominal: Soft, nontender, slightly distended; no organomegaly, bowel sounds are heard Extremities: Trace lower extremity edema; no cyanosis  CNS: Awake but confused.  Slow to respond.  Poor historian no focal neurologic deficit.  Moves extremities Lymph: No obvious lymphadenopathy Skin: No obvious ecchymosis/lesions  psych: Extremely flat affect.  Currently not agitated Genitourinary: Foley catheter present Musculoskeletal: No obvious joint swelling/deformity     Data Reviewed: I have personally reviewed following labs and imaging studies  CBC: Recent Labs  Lab 02/29/24 0300 03/02/24 1059 03/03/24 0950 03/04/24 0408 03/05/24 0306  WBC 3.6* 4.8 4.7 4.3 4.4  NEUTROABS  --   --   --   --  2.3  HGB 9.1* 9.7* 9.2* 8.1* 9.2*  HCT 27.3* 28.8* 26.8* 24.5* 26.7*  MCV 88.1 88.1 86.7 88.1 85.9  PLT 183 104* 90* 80* 95*   Basic Metabolic Panel: Recent Labs  Lab 02/29/24 0300 03/02/24 1059 03/03/24 0950 03/04/24 0408 03/05/24 0306  NA 128* 128* 126* 125* 126*  K 3.6 3.3* 3.5 3.3* 3.3*  CL 98 97* 97* 96* 94*  CO2 22 19* 20* 21* 23  GLUCOSE 94 128* 104* 90 108*  BUN 6* 11 11 10 11   CREATININE 0.78 0.69 0.74 0.75 0.90  CALCIUM  8.6* 8.6* 8.8* 8.4* 9.3  MG  --   --   --   --  1.6*   GFR: Estimated Creatinine Clearance: 62.3 mL/min (by C-G formula based on SCr of 0.9 mg/dL). Liver Function Tests: No results for input(s): AST, ALT, ALKPHOS, BILITOT, PROT, ALBUMIN  in the last 168 hours. No results for input(s): LIPASE, AMYLASE in the last 168 hours. No results for input(s): AMMONIA in the last 168 hours. Coagulation Profile: No results for input(s): INR, PROTIME in the last 168 hours. Cardiac Enzymes: Recent Labs  Lab 02/29/24 0300  CKTOTAL 26*   BNP (last 3 results) No results for  input(s): PROBNP in the last 8760 hours. HbA1C: No results for input(s): HGBA1C in the last 72 hours. CBG: Recent Labs  Lab 03/04/24 0728 03/04/24 1126 03/04/24 1751 03/04/24 2014 03/05/24 0800  GLUCAP 96 108* 118* 116* 89   Lipid Profile: No results for input(s): CHOL, HDL, LDLCALC, TRIG, CHOLHDL, LDLDIRECT in the last 72 hours. Thyroid  Function Tests: No results for input(s): TSH, T4TOTAL, FREET4, T3FREE, THYROIDAB in the last 72 hours. Anemia Panel: No results for input(s): VITAMINB12, FOLATE, FERRITIN, TIBC, IRON, RETICCTPCT in the last 72 hours. Sepsis Labs: No results for input(s): PROCALCITON, LATICACIDVEN in the last 168 hours.  Recent Results (from the past 240 hours)  Aerobic/Anaerobic Culture w Gram Stain (surgical/deep wound)     Status: None   Collection Time: 02/24/24  4:37 PM   Specimen: Body Fluid  Result Value Ref Range Status   Specimen Description   Final    FLUID Performed at [facility], [address] [place], [place] [address]    Special Requests   Final    NONE Performed at [facility], [address], [place], [place] [address]    Gram Stain   Final    FEW WBC PRESENT, PREDOMINANTLY PMN NO ORGANISMS SEEN    Culture   Final    No growth aerobically or anaerobically. Performed at [facility], [address] [place], [place] [address]    Report Status 02/29/2024 FINAL  Final         Radiology Studies: No results found.      Scheduled Meds:  allopurinol   300 mg Oral Daily   apixaban   5 mg Oral BID   atorvastatin   20 mg Oral QHS   carvedilol   3.125 mg Oral BID   Chlorhexidine  Gluconate Cloth  6 each Topical Daily   cycloSPORINE   1 drop Both Eyes Daily   flecainide   100 mg Oral BID   folic acid   1 mg Oral Daily   gabapentin   300 mg Oral Daily   gabapentin   600 mg Oral QPM   hydrALAZINE   25 mg Oral BID   hydrocortisone  cream   Topical TID    insulin  aspart  0-6 Units Subcutaneous TID AC & HS   montelukast   10 mg Oral QHS   sodium chloride  flush  3 mL Intravenous Q12H   sodium chloride   1 g Oral TID WC   tamsulosin   0.4 mg Oral QPC supper   thiamine   100 mg Oral  Daily   torsemide   20 mg Oral BID   Continuous Infusions:  ceFEPime  (MAXIPIME ) IV 2 g (03/05/24 0511)   DAPTOmycin  600 mg (03/04/24 1431)          Sophie Mao, MD [facility] 03/05/2024, 8:25 AM   "

## 2024-03-06 DIAGNOSIS — F430 Acute stress reaction: Secondary | ICD-10-CM | POA: Diagnosis not present

## 2024-03-06 DIAGNOSIS — F10230 Alcohol dependence with withdrawal, uncomplicated: Secondary | ICD-10-CM

## 2024-03-06 DIAGNOSIS — F4389 Other reactions to severe stress: Secondary | ICD-10-CM

## 2024-03-06 DIAGNOSIS — G4700 Insomnia, unspecified: Secondary | ICD-10-CM

## 2024-03-06 DIAGNOSIS — K6812 Psoas muscle abscess: Secondary | ICD-10-CM | POA: Diagnosis not present

## 2024-03-06 LAB — GLUCOSE, CAPILLARY
Glucose-Capillary: 106 mg/dL — ABNORMAL HIGH (ref 70–99)
Glucose-Capillary: 108 mg/dL — ABNORMAL HIGH (ref 70–99)
Glucose-Capillary: 133 mg/dL — ABNORMAL HIGH (ref 70–99)
Glucose-Capillary: 134 mg/dL — ABNORMAL HIGH (ref 70–99)

## 2024-03-06 LAB — BASIC METABOLIC PANEL WITH GFR
Anion gap: 9 (ref 5–15)
BUN: 12 mg/dL (ref 8–23)
CO2: 24 mmol/L (ref 22–32)
Calcium: 9.2 mg/dL (ref 8.9–10.3)
Chloride: 97 mmol/L — ABNORMAL LOW (ref 98–111)
Creatinine, Ser: 1.2 mg/dL (ref 0.61–1.24)
GFR, Estimated: >60 mL/min
Glucose, Bld: 105 mg/dL — ABNORMAL HIGH (ref 70–99)
Potassium: 3.2 mmol/L — ABNORMAL LOW (ref 3.5–5.1)
Sodium: 130 mmol/L — ABNORMAL LOW (ref 135–145)

## 2024-03-06 LAB — MAGNESIUM: Magnesium: 1.7 mg/dL (ref 1.7–2.4)

## 2024-03-06 MED ORDER — HALOPERIDOL LACTATE 5 MG/ML IJ SOLN
1.0000 mg | Freq: Four times a day (QID) | INTRAMUSCULAR | Status: DC | PRN
  Administered 2024-03-06 (×2): 2 mg via INTRAVENOUS
  Filled 2024-03-06 (×2): qty 1

## 2024-03-06 MED ORDER — OXYBUTYNIN CHLORIDE 5 MG PO TABS
5.0000 mg | ORAL_TABLET | Freq: Three times a day (TID) | ORAL | Status: DC | PRN
  Administered 2024-03-06 – 2024-03-30 (×8): 5 mg via ORAL
  Filled 2024-03-06 (×8): qty 1

## 2024-03-06 MED ORDER — RAMELTEON 8 MG PO TABS
8.0000 mg | ORAL_TABLET | Freq: Every day | ORAL | Status: DC
  Administered 2024-03-06 – 2024-03-07 (×2): 8 mg via ORAL
  Filled 2024-03-06 (×2): qty 1

## 2024-03-06 MED ORDER — POTASSIUM CHLORIDE CRYS ER 20 MEQ PO TBCR
40.0000 meq | EXTENDED_RELEASE_TABLET | ORAL | Status: AC
  Administered 2024-03-06 (×2): 40 meq via ORAL
  Filled 2024-03-06 (×2): qty 2

## 2024-03-06 MED ORDER — SODIUM CHLORIDE 1 G PO TABS
1.0000 g | ORAL_TABLET | Freq: Three times a day (TID) | ORAL | Status: DC
  Administered 2024-03-06 – 2024-03-10 (×7): 1 g via ORAL
  Filled 2024-03-06 (×7): qty 1

## 2024-03-06 NOTE — Progress Notes (Signed)
 Physical Therapy Treatment Patient Details Name: Charles Lloyd. MRN: [id number] DOB: [birth year] Today's Date: 03/06/2024   History of Present Illness Charles Lloyd. is a 84 y.o. male who presented to the emergency department on 02/22/2024 for leg pain and weakness, with family reporting multiple falls over the past 6 to 8 months.  MRI lumbar spine revealed a new 3.5 x 2.4 cm fluid collection in the left psoas muscle concerning for abscess.  Neurosurgery was consulted at that time and recommended IV antibiotics and possible aspiration of abscess.  with significant PMH of hypertension, hyperlipidemia, type 2 diabetes, atrial fibrillation on anticoagulation, s/p recent L4-5 trans psoas lumbar interbody fusion left-sided approach on 02/06/2024, CHF,gout,CAD, TKA with infection, neuropathy    PT Comments   Pt admitted with above diagnosis.  Pt currently with functional limitations due to the deficits listed below (see PT Problem List). Pt in bed when PT arrived. Daughter in law present and son arrived during intervention. Pt reported need to void bowels. Pt appears to have increased confusion, impulsive behaviors and required increased physical assist and cues for all functional mobility tasks with increased time for all motor processing and planning, multimodal cues and facilitation and cues for attention and safety. Pt required mod A for supine to sit with use of hospital bed features, cues for placing B LE on floor for improved sitting balance and mod A x 2 for sit to stand  from elevated EOB, pt required mod A x 2 with RW for bed to bathroom 15 feet with erratic ataxic like pattern, strong posterior lean, A for RW management and cues throughout, pt required max A x 2 for sit to stand  from [facility] over standard commode, max A x 2 for static standing for assist with hygiene tasks, max A x 2 for ambulation with RW to recliner. Pt left seated in recliner, all needs in place and family present. Patient will  benefit from intensive inpatient follow-up therapy, >3 hours/day vs  inpatient follow up therapy, <3 hours/day. Pt will benefit from acute skilled PT to increase their independence and safety with mobility to allow discharge.      If plan is discharge home, recommend the following: Assist for transportation;Help with stairs or ramp for entrance;Assistance with cooking/housework;A lot of help with bathing/dressing/bathroom;Two people to help with walking and/or transfers   Can travel by private vehicle        Equipment Recommendations  None recommended by PT    Recommendations for Other Services Rehab consult     Precautions / Restrictions Precautions Precautions: Fall Recall of Precautions/Restrictions: Impaired Precaution/Restrictions Comments: trunk flexion at baseline Restrictions Weight Bearing Restrictions Per Provider Order: No     Mobility  Bed Mobility Overal bed mobility: Needs Assistance Bed Mobility: Supine to Sit     Supine to sit: HOB elevated, Used rails, Mod assist     General bed mobility comments: increased time, cues, use of hospital bed features and bed pad with cues for maintaining B LE on floor    Transfers Overall transfer level: Needs assistance Equipment used: Rolling walker (2 wheels) Transfers: Sit to/from Stand Sit to Stand: Mod assist, +2 physical assistance, +2 safety/equipment           General transfer comment: pt required increased physical assist for functional transfer tasks today bed, commode and recliner with max A for RW management, strong posteiror lean with multimodal cues and facilitation with minimal change in posture    Ambulation/Gait Ambulation/Gait assistance: +  2 physical assistance, +2 safety/equipment, Mod assist, Max assist Gait Distance (Feet): 15 Feet Assistive device: Rolling walker (2 wheels) Gait Pattern/deviations: Decreased stride length, Trunk flexed, Ataxic, Step-to pattern, Shuffle, Staggering left,  Staggering right, Leaning posteriorly Gait velocity: Decreased     General Gait Details: bed to commode mod A x 2 with A for RW management, cues and posterior lean with erratic pattern and wide BOS, bathroom to recliner -- max A x 2 pt strong posteiror lean, unable to modify posture with extensive multimodal cues and facilitation with pt feet sliding forward in a pike like position, total A to manage RW with pt making attempts to lift RW up off the floor and unable to manage to push anteriorly, erratic pattern and pt appearing impusive with expressed fear of falling, cues for redirection, safety, attention with little avail and change in behavior presentation   Stairs             Wheelchair Mobility     Tilt Bed    Modified Rankin (Stroke Patients Only)       Balance Overall balance assessment: Needs assistance, History of Falls Sitting-balance support: No upper extremity supported Sitting balance-Leahy Scale: Poor       Standing balance-Leahy Scale: Zero Standing balance comment: mod A to max A x 2 with strong posterior lean                            Communication Communication Communication: Impaired Factors Affecting Communication: Difficulty expressing self (difficulty with word finding and increased time for verbal response)  Cognition Arousal: Alert Behavior During Therapy: WFL for tasks assessed/performed   PT - Cognitive impairments: Safety/Judgement, Sequencing, Problem solving                       PT - Cognition Comments: mild confusion noted, strong fear of falling and pt impusive, fidgeting with catheter tubing Following commands: Impaired Following commands impaired: Only follows one step commands consistently    Cueing Cueing Techniques: Verbal cues, Gestural cues, Tactile cues, Visual cues  Exercises      General Comments General comments (skin integrity, edema, etc.): B UE edema, skin deteration on buttocks and nuse aware       Pertinent Vitals/Pain      Home Living                          Prior Function            PT Goals (current goals can now be found in the care plan section) Acute Rehab PT Goals Patient Stated Goal: To be able to manage at home alone. Progress towards PT goals: Progressing toward goals    Frequency    Min 3X/week      PT Plan      Co-evaluation              AM-PAC PT 6 Clicks Mobility   Outcome Measure  Help needed turning from your back to your side while in a flat bed without using bedrails?: A Lot Help needed moving from lying on your back to sitting on the side of a flat bed without using bedrails?: A Lot Help needed moving to and from a bed to a chair (including a wheelchair)?: A Lot Help needed standing up from a chair using your arms (e.g., wheelchair or bedside chair)?: A Lot Help needed to walk in  hospital room?: A Lot Help needed climbing 3-5 steps with a railing? : Total 6 Click Score: 11    End of Session Equipment Utilized During Treatment: Gait belt Activity Tolerance: Patient limited by fatigue Patient left: in chair;with call bell/phone within reach;with chair alarm set;with family/visitor present Nurse Communication: Mobility status PT Visit Diagnosis: Muscle weakness (generalized) (M62.81);Difficulty in walking, not elsewhere classified (R26.2)     Time: 8499-8473 PT Time Calculation (min) (ACUTE ONLY): 26 min  Charges:    $Gait Training: 8-22 mins $Therapeutic Activity: 8-22 mins PT General Charges $$ ACUTE PT VISIT: 1 Visit                     [place], PT Acute Rehab    [place] VEAR Drone 03/06/2024, 3:49 PM

## 2024-03-06 NOTE — Progress Notes (Signed)
 " PROGRESS NOTE    Charles Lloyd Juanito Mickey.  FMW:[id number] DOB: 1940-06-17 DOA: 02/22/2024 PCP: Theophilus Andrews, Tully GRADE, MD   Brief Narrative:  [phone] with h/o HTN, HLD, T2DM, afib on Eliquis , and L4-5 spondylolisthesis s/p fusion on 12/11 who presented with LLE weakness and pain with falls. He was found to have a 3.5 x 2.4 cm L psoas muscle abscess. Neurosurgery recommended IR drainage and IV antibiotics.  Underwent CT-guided aspiration on 02/24/24. Developed hospital-associated delirium requiring ICU transfer, now improved.  Currently on daptomycin  and cefepime  till 04/13/2024 as per ID recommendations.  Awaiting placement.  Assessment & Plan:   Left psoas abscess -Had recent transverse lumbar interbody fusion on 02/06/2024. -S/p IR CT guided aspiration of left psoas fluid collection, drain placed.  No growth on fluid and blood cultures - Currently on daptomycin  and cefepime  till 04/13/2024 as per ID recommendations.  Currently has a PICC. -Awaiting placement.  TOC following.  Denied by insurance for CIR placement.  Delirium Alcohol  withdrawal syndrome with delirium Possible underlying dementia -MRI brain unremarkable -Off precedex  drip.  Completed phenobarbital  taper - Mental status improved.  Continue delirium precautions and frequent reorientation. - Avoid Ativan : Son was concerned about paradoxical response to Ativan .  Currently added as an allergy  Paroxysmal A-fib - Currently rate controlled.  Continue flecainide , Coreg  and Eliquis   Chronic diastolic heart failure Hypertension hyperlipidemia - Compensated.  Continue strict input and output.  Daily weights.  Continue hydralazine , Coreg .  Torsemide  resumed on 03/04/2024.  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  Blood sugars currently stable.  Continue CBGs with SSI.  Will not continue insulin  at discharge.  Continue  gabapentin   Asthma - No exacerbation.  Continue montelukast   Recurrent falls -Awaiting rehab placement  Wound 02/24/24 1245 Pressure Injury Buttocks Mid;Lower Deep Tissue Pressure Injury - Purple or maroon localized area of discolored intact skin or blood-filled blister due to damage of underlying soft tissue from pressure and/or shear. (Active)     Wound 02/24/24 1245 Pressure Injury Scrotum Circumferential Deep Tissue Pressure Injury - Purple or maroon localized area of discolored intact skin or blood-filled blister due to damage of underlying soft tissue from pressure and/or shear. (Active)  Present to admission: Continue local wound care  Acute on chronic urinary retention - Failed voiding trial on 03/04/2024 and Foley catheter had to be replaced on 03/04/2024.  Continue Flomax .  Outpatient follow-up with urology  Hyponatremia - Sodium slightly improving to 130 today.  Continue diuretic.  Continue salt tablets. Monitor  Hypokalemia - Replace.  Repeat a.m. labs  Hypomagnesemia - Improved  Anemia of chronic disease - From chronic illnesses.  Hemoglobin stable.  Monitor intermittently  Thrombocytopenia -Questionable cause.  No signs of bleeding.  Monitor  DVT prophylaxis: Eliquis  Code Status: Full Family Communication: Son at bedside  Disposition Plan: Status is: Inpatient Remains inpatient appropriate because: Of severity of illness.  Need for SNF placement    Consultants: Neurosurgery/IR/PCCM/ID.  Consult palliative care  Procedures: As above  Antimicrobials:  Anti-infectives (From admission, onward)    Start     Dose/Rate Route Frequency Ordered Stop   02/28/24 2200  ceFEPIme  (MAXIPIME ) 2 g in sodium chloride  0.9 % 100 mL IVPB        2 g 200 mL/hr over 30 Minutes Intravenous Every 8 hours 02/28/24 1440     02/28/24 1530  DAPTOmycin  (CUBICIN ) 600 mg in sodium chloride  0.9 % IVPB  8 mg/kg  79.3 kg 124 mL/hr over 30 Minutes Intravenous Daily 02/28/24 1440      02/28/24 0000  daptomycin  (CUBICIN ) IVPB        600 mg Intravenous Every 24 hours 02/28/24 1444 04/10/24 2359   02/28/24 0000  ceFEPime  (MAXIPIME ) IVPB        2 g Intravenous Every 8 hours 02/28/24 1444 04/10/24 2359   02/23/24 2200  vancomycin  (VANCOREADY) IVPB 1500 mg/300 mL  Status:  Discontinued        1,500 mg 150 mL/hr over 120 Minutes Intravenous Every 24 hours 02/22/24 2348 02/23/24 0242   02/23/24 1000  linezolid  (ZYVOX ) IVPB 600 mg  Status:  Discontinued       Note to Pharmacy: Pt developed rash shortly after vancomycin  administration.   600 mg 300 mL/hr over 60 Minutes Intravenous Every 12 hours 02/23/24 0242 02/28/24 1440   02/23/24 0800  piperacillin -tazobactam (ZOSYN ) IVPB 3.375 g  Status:  Discontinued        3.375 g 12.5 mL/hr over 240 Minutes Intravenous Every 8 hours 02/22/24 2348 02/28/24 1440   02/22/24 2315  piperacillin -tazobactam (ZOSYN ) IVPB 3.375 g        3.375 g 100 mL/hr over 30 Minutes Intravenous  Once 02/22/24 2306 02/23/24 0013   02/22/24 2315  vancomycin  (VANCOREADY) IVPB 1500 mg/300 mL        1,500 mg 150 mL/hr over 120 Minutes Intravenous  Once 02/22/24 2306 02/23/24 0141        Subjective: Patient seen and examined at bedside.  Remains confused.  No fever, vomiting, agitation reported.  Son at bedside concerned that patient is slightly more anxious and intermittently agitated this morning. Objective: Vitals:   03/05/24 0533 03/05/24 1315 03/05/24 1956 03/06/24 0523  BP: 123/62 123/66 132/73 (!) 120/56  Pulse: 85 81 83 85  Resp: 16 18 15    Temp: 97.9 F (36.6 C) (!) 97.5 F (36.4 C) 97.8 F (36.6 C) 99.1 F (37.3 C)  TempSrc: Oral Oral Oral Oral  SpO2: 98% 96% 92% 96%  Weight:      Height:        Intake/Output Summary (Last 24 hours) at 03/06/2024 0802 Last data filed at 03/06/2024 0558 Gross per 24 hour  Intake 742 ml  Output 2100 ml  Net -1358 ml   Filed Weights   02/22/24 1238 02/24/24 1245  Weight: 79.4 kg 79.3 kg     Examination:  General: No acute distress.  Remains on room air.   ENT/neck: No dilation or palpable neck masses noted  respiratory: Bilateral decreased breath sounds at bases with scattered crackles CVS: Rate mostly controlled; S1 and S2 are heard Abdominal: Soft, nontender, distended mildly; no organomegaly, bowel sounds are heard normally Extremities: No clubbing; mild lower extremity edema present CNS: Remains slightly confused presently.  Still slow to respond.  Poor historian; no focal neurologic deficit.  Able to move extremities Lymph: No obvious palpable lymphadenopathy Skin: No obvious petechiae/rashes psych: Mostly flat affect with no signs of agitation currently genitourinary: Currently has a Foley catheter  musculoskeletal: No obvious joint tenderness/erythema     Data Reviewed: I have personally reviewed following labs and imaging studies  CBC: Recent Labs  Lab 02/29/24 0300 03/02/24 1059 03/03/24 0950 03/04/24 0408 03/05/24 0306  WBC 3.6* 4.8 4.7 4.3 4.4  NEUTROABS  --   --   --   --  2.3  HGB 9.1* 9.7* 9.2* 8.1* 9.2*  HCT 27.3* 28.8* 26.8* 24.5* 26.7*  MCV 88.1  88.1 86.7 88.1 85.9  PLT 183 104* 90* 80* 95*   Basic Metabolic Panel: Recent Labs  Lab 03/02/24 1059 03/03/24 0950 03/04/24 0408 03/05/24 0306 03/06/24 0434  NA 128* 126* 125* 126* 130*  K 3.3* 3.5 3.3* 3.3* 3.2*  CL 97* 97* 96* 94* 97*  CO2 19* 20* 21* 23 24  GLUCOSE 128* 104* 90 108* 105*  BUN 11 11 10 11 12   CREATININE 0.69 0.74 0.75 0.90 1.20  CALCIUM  8.6* 8.8* 8.4* 9.3 9.2  MG  --   --   --  1.6* 1.7   GFR: Estimated Creatinine Clearance: 46.7 mL/min (by C-G formula based on SCr of 1.2 mg/dL). Liver Function Tests: No results for input(s): AST, ALT, ALKPHOS, BILITOT, PROT, ALBUMIN  in the last 168 hours. No results for input(s): LIPASE, AMYLASE in the last 168 hours. No results for input(s): AMMONIA in the last 168 hours. Coagulation Profile: No results for  input(s): INR, PROTIME in the last 168 hours. Cardiac Enzymes: Recent Labs  Lab 02/29/24 0300  CKTOTAL 26*   BNP (last 3 results) No results for input(s): PROBNP in the last 8760 hours. HbA1C: No results for input(s): HGBA1C in the last 72 hours. CBG: Recent Labs  Lab 03/05/24 0800 03/05/24 1147 03/05/24 1646 03/05/24 1956 03/06/24 0736  GLUCAP 89 160* 165* 125* 106*   Lipid Profile: No results for input(s): CHOL, HDL, LDLCALC, TRIG, CHOLHDL, LDLDIRECT in the last 72 hours. Thyroid  Function Tests: No results for input(s): TSH, T4TOTAL, FREET4, T3FREE, THYROIDAB in the last 72 hours. Anemia Panel: No results for input(s): VITAMINB12, FOLATE, FERRITIN, TIBC, IRON, RETICCTPCT in the last 72 hours. Sepsis Labs: No results for input(s): PROCALCITON, LATICACIDVEN in the last 168 hours.  No results found for this or any previous visit (from the past 240 hours).        Radiology Studies: No results found.      Scheduled Meds:  allopurinol   300 mg Oral Daily   apixaban   5 mg Oral BID   atorvastatin   20 mg Oral QHS   carvedilol   3.125 mg Oral BID   Chlorhexidine  Gluconate Cloth  6 each Topical Daily   cycloSPORINE   1 drop Both Eyes Daily   flecainide   100 mg Oral BID   folic acid   1 mg Oral Daily   gabapentin   300 mg Oral Daily   gabapentin   600 mg Oral QPM   hydrALAZINE   25 mg Oral BID   hydrocortisone  cream   Topical TID   insulin  aspart  0-6 Units Subcutaneous TID AC & HS   montelukast   10 mg Oral QHS   sodium chloride  flush  3 mL Intravenous Q12H   sodium chloride   2 g Oral TID WC   tamsulosin   0.4 mg Oral QPC supper   thiamine   100 mg Oral Daily   torsemide   20 mg Oral BID   Continuous Infusions:  ceFEPime  (MAXIPIME ) IV 2 g (03/06/24 0528)   DAPTOmycin  Stopped (03/05/24 1528)          Sophie Mao, MD [facility] 03/06/2024, 8:02 AM   "

## 2024-03-06 NOTE — TOC Transition Note (Addendum)
 Transition of Care [facility]) - Discharge Note   Patient Details  Name: Charles Lloyd. MRN: [id number] Date of Birth: 09-Jun-1940  [facility]) CM/SW Contact:  Bascom Service, RN Phone Number: 03/06/2024, 9:29 AM   Clinical Narrative: [place] [facility] for [facility] plan [facility] pi#[phone] rep [facility] grooms a bed. Await d/c summary prior rm#,report#,PTAR.   -9:55a Per attending not medically stable tod ay for d/c.  -10:42a [facility] rep [place] bed not available till Monday since no weekend admit. Auth good till 03/09/24.    Final next level of care: Skilled Nursing Facility Barriers to Discharge: No Barriers Identified   Patient Goals and CMS Choice Patient states their goals for this hospitalization and ongoing recovery are:: Rehab CMS Medicare.gov Compare Post Acute Care list provided to:: Patient Represenative (must comment) (Eoghan(son)) Choice offered to / list presented to : Adult Children [facility] ownership interest in [facility].provided to:: Adult Children    Discharge Placement              Patient chooses bed at: [facility] Patient to be transferred to facility by: PTAR Name of family member notified: Jireh)son) Patient and family notified of of transfer: 03/06/24  Discharge Plan and Services Additional resources added to the After Visit Summary for   In-house Referral: NA Discharge Planning Services: CM Consult Post Acute Care Choice: Durable Medical Equipment, IP Rehab                               Social Drivers of Health (SDOH) Interventions SDOH Screenings   Food Insecurity: No Food Insecurity (02/23/2024)  Housing: Low Risk (02/23/2024)  Transportation Needs: No Transportation Needs (02/23/2024)  Utilities: Not At Risk (02/23/2024)  Alcohol  Screen: Low Risk (12/21/2022)  Depression (PHQ2-9): Low Risk (02/11/2024)  Financial Resource Strain: Low Risk (12/21/2022)  Physical Activity: Inactive  (12/21/2022)  Social Connections: Socially Isolated (02/23/2024)  Stress: No Stress Concern Present (12/21/2022)  Tobacco Use: Low Risk (02/22/2024)  Health Literacy: Adequate Health Literacy (12/21/2022)     Readmission Risk Interventions    02/26/2024    2:28 PM 08/08/2021   10:00 AM  Readmission Risk Prevention Plan  Transportation Screening Complete Complete  PCP or Specialist Appt within 3-5 Days Complete   HRI or Home Care Consult Complete   Social Work Consult for Recovery Care Planning/Counseling Complete   Palliative Care Screening Not Applicable   Medication Review Oceanographer) Complete Complete  PCP or Specialist appointment within 3-5 days of discharge  Complete  HRI or Home Care Consult  Complete  SW Recovery Care/Counseling Consult  Not Complete  Palliative Care Screening  Not Applicable  Skilled Nursing Facility  Not Applicable

## 2024-03-06 NOTE — Consult Note (Signed)
 [facility] Psychiatric Consult Initial  Patient Name: .Charles Lloyd.  MRN: [id number]  DOB: [birth year]  Consult Order details:  Orders (From admission, onward)     Start     Ordered   03/06/24 1141  IP CONSULT TO PSYCHIATRY       Ordering Provider: Cheryle Page, MD  Provider:  (Not yet assigned)  Question Answer Comment  Location [facility]   Reason for Consult? Agitation/depression; family requesting psych eval      03/06/24 1140             Mode of Visit: In person    Psychiatry Consult Evaluation  Service Date: March 06, 2024 LOS:  LOS: 13 days  Chief Complaint Family concerned over agitation and depression.  Primary Psychiatric Diagnoses  Stress reaction with mixed disturbance of emotion and conduct Alcohol  withdrawal Insomnia  Assessment  Charles Lloyd. is a 84 y.o. male admitted: Presented to the EDfor 02/22/2024  1:07 PM for fall at home. He carries the psychiatric diagnoses of none and has a past medical history of  multiple medical issues.   His current presentation of confusion is most consistent with stress reaction with mixed disturbance of emotion and conduct. He meets criteria for stress reaction based on change in environment and multiple medical and physical stressors and lengthy hospital stay. Disorientation and confusion fluctuate.  On initial examination, patient was oriented X4 but staff report some confusion and agitation over the past 1-2 days. Please see plan below for detailed recommendations.   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)   NIDDM-2 with polyneuropathy and hyperglycemia   Hypercholesterolemia   Hyponatremia   Asthma   Essential hypertension   Recurrent falls   Altered  mental status   Alcohol  withdrawal syndrome, with delirium (HCC)   Delirium   Pressure injury   Acute on chronic urinary retention    Plan   ## Psychiatric Medication Recommendations:  Ramelteon  8mg  at bedtime  ## Further Work-up:  -- most recent EKG on 02/29/24 had QtC of 595, consistent with previous reports -- Pertinent labwork reviewed earlier this admission includes: hyponatremia and anemia   ## Disposition:-- There are no psychiatric contraindications to discharge at this time  ## Behavioral / Environmental: -Delirium Precautions: Delirium Interventions for Nursing and Staff: - RN to open blinds every AM. - To Bedside: Glasses, hearing aide, and pt's own shoes. Make available to patients. when possible and encourage use. - Encourage po fluids when appropriate, keep fluids within reach. - OOB to chair with meals. - Passive ROM exercises to all extremities with AM & PM care. - RN to assess orientation to person, time and place QAM and PRN. - Recommend extended visitation hours with familiar family/friends as feasible. - Staff to minimize disturbances at night. Turn off television and keep door closed to decrease external stimuli.    ## Safety and Observation Level:  - Based on my clinical evaluation, I estimate the patient to be at minimal risk of self harm in the current setting. - At this time, we recommend  routine. This decision is based on my review of the chart including patient's history and current presentation, interview of the patient, mental status examination, and consideration of suicide risk including evaluating suicidal ideation, plan,  intent, suicidal or self-harm behaviors, risk factors, and protective factors. This judgment is based on our ability to directly address suicide risk, implement suicide prevention strategies, and develop a safety plan while the patient is in the clinical setting. Please contact our team if there is a concern that risk level has  changed.  CSSR Risk Category:C-SSRS RISK CATEGORY: No Risk  Suicide Risk Assessment: Patient has following modifiable risk factors for suicide: untreated depression, which we are addressing by starting sleep medication and working toward rehab plan. Patient has following non-modifiable //or demographic risk factors for suicide: early widowhood Patient has the following protective factors against suicide: Supportive family and Supportive friends  Thank you for this consult request. Recommendations have been communicated to the primary team.  We will continue following him at this time.   Sharlot Becker, NP       History of Present Illness  Relevant Aspects of [facility] Course:  Admitted on 02/22/2024 for recurrent falls.   Patient Report:  Patient family and staff report worsening symptoms of depression and agitation over the last couple days. He states he does not know what is going on but upon further questioning it seems he is not confused but describing all the new providers and staff coming in and out of his room. He became frustrated when he was trying to call his son and locked himself out of his phone (his phone has a new iOS update). He has currently been in this facility fo 13 days and planned to go to rehab facility today which was postponed due to his confusion at times. He is disoriented at times looking for his wife of 40 years who passed 13 years ago (per son). He is easily reoriented to situation. Nurse reports he was emotional yesterday and crying and after a dose of Haldol  and was less agitated with the ability to rest. She states he did not like watching baseball and likes music so she played some for him. He denies any thoughts of harm to self or others, denies auditory or visual hallucinations. Charles Lloyd did request something to help him sleep as he is struggling to obtain sleep in this new environment.  He is motivated to get to rehab as planned and back home to his  routine, medication ordered after permission from his son obtained.  Caveat:  Charles Lloyd got tearful when he expressed that he was totally himself above the waists but below the waist were tubes and other things.  Encouragement provided that rehab would assist him in regaining his physical strength.    This provider spoke with his son, Charles Lloyd, who was concerned as he dad was clear and coherent until his MRI after he was given Ativan .  His behaviors continued in the ICU with mitts being needed and only stopped when he was changed to phenobarbital  versus Ativan .  Discussed his father has been through many physical stressors in the past 13 days with his routine and environment change that is triggering his disorientation.  It was noted that lower stimulation, like having his door closed, assists with decreasing his anxiety and confusion which this provider witnessed.  Discussed sleep medication of Rozerem  to assist with sleep and not interfere with his QTc interval that is prolonged.  He was agreeable for him to start it, order placed.  Psych ROS:  Depression: yes Anxiety:  yes Mania (lifetime and current): none Psychosis: (lifetime and current): none  Collateral information:  Contacted from son Charles Lloyd) at 2pm on  03/07/24, see note above.  Review of Systems  All other systems reviewed and are negative.    Psychiatric and Social History  Psychiatric History:  Information collected from Patient, nurse, daughter in law, son.  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, plan to go to rehab  Access to weapons/lethal means: will assess    Substance History Alcohol : 4-5 drinks per day prior to admission  Type of alcohol  liquor  Last Drink 13 days ago prior to admission Number of drinks per day 4-5 History of alcohol  withdrawal seizures none History of DT's none Tobacco: none   Exam Findings  Physical Exam: by MD, reviewed Vital Signs:  Temp:  [97.8 F (36.6 C)-99.1 F (37.3 C)] 99.1 F (37.3 C) (01/09 0523) Pulse Rate:  [83-85] 85 (01/09 0523) Resp:  [15] 15 (01/08 1956) BP: (120-132)/(56-73) 120/56 (01/09 0523) SpO2:  [92 %-96 %] 96 % (01/09 0523) Blood pressure (!) 120/56, pulse 85, temperature 99.1 F (37.3 C), temperature source Oral, resp. rate 15, height 5' 8 (1.727 m), weight 79.3 kg, SpO2 96%. Body mass index is 26.58 kg/m.  Physical Exam  Mental Status Exam: General Appearance: Casual  Orientation:  Full (Time, Place, and Person)  Memory:  Immediate;   Fair Recent;   Fair Remote;   Fair  Concentration:  Concentration: Fair and Attention Span: Fair  Recall:  Fair  Attention  Fair  Eye Contact:  Good  Speech:  Clear and Coherent  Language:  Good  Volume:  Normal  Mood: Anxious   Affect:  Congruent  Thought Process:  Goal Directed  Thought Content:  Logical  Suicidal Thoughts:  No  Homicidal Thoughts:  No  Judgement:  Good  Insight:  Fair  Psychomotor Activity:  Normal   Akathisia:  No  Fund of Knowledge:  Good      Assets:  Desire for Improvement Financial Resources/Insurance Housing Resilience Social Support Transportation  Cognition: WDL  ADL's:  Intact  AIMS (if indicated):        Other History   These have been pulled in through the EMR, reviewed, and updated if appropriate.  Family History:  The patient's family history includes Cancer in his father and mother; Depression in his father and mother; Early death in his father and mother.  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 Functional class II, ejection fraction 35-40%   Chronic anticoagulation    Chronic diastolic (congestive) heart failure (HCC) 04/16/2013   Functional class II, ejection fraction 35-40%  Last Assessment & Plan:  Clinically stable Volume well controlled meds reviewed   Chronic diastolic CHF (congestive heart failure) (HCC) 04/16/2013   Functional class II, ejection fraction 35-40%  Last Assessment & Plan:  Clinically stable Volume well controlled meds reviewed   Colon polyps 04/16/2013  Coronary artery disease involving native coronary artery of native heart without angina pectoris 04/05/2022   Myoview  05/25/2019: EF 60, normal perfusion, low risk CCTA (AF protocol) 04/25/20: CAC score 4242 (99th percentile)   Diabetic neuropathy (HCC)    Difficult intubation    pt reports difficult intubation during surgery in [place] in 2015   Dysrhythmia    A.Fib   Eosinophil count raised 02/17/2019   Essential hypertension 01/06/2010   Last Assessment & Plan:  Well controlled Continue med management   Gout    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  Steroid usage was discontinued several months ago.  Follow up with Dermatology as planned.   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   Last Assessment & Plan:  Id following ?ongoing abx  managementy reported Request records   Infection of prosthetic right knee joint    Insomnia 03/04/2012   Grief with loss of wife 02/20/12  Last Assessment & Plan:  Improved sx  contineu xanax prn Se discussed   Lesion of liver 04/20/2019   -On cardiac CT 02/2019   Mild reactive airways disease    Neoplasm of prostate 04/16/2013   Neoplasm of prostate, 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 Transthoracic Echocardiogram-03/10/2013-[facility]: Normal left ventricular wall thickness and cavity size.  Global left ventricular systolic function is moderately reduced.  The estimated ejection fraction is 35-40%.  The left atrium is moderately enlarged.  The right atrium is mildly enlarged.  No significant valvular    Overweight (BMI 25.0-29.9) 10/22/2016   Persistent atrial fibrillation (HCC) 04/16/2013   Last Assessment & Plan:  Rate controlled Continue med management eliquis  for stroke preventino  Formatting of this note might be different from the original.  Drug  HX Current Rx Pre-ABL inefficacy Pre-ABL intolerant Post-ABL inefficacy Post-ABL intolerant max dose/24h M/Y end comments  sotalol                  dofetilide                  flecainide                   propafenone                  am   S/P TKR (total knee replacement), bilateral    Secondary hypercoagulable state 04/09/2019   Tinea cruris 04/16/2013   Type 2 diabetes mellitus with diabetic polyneuropathy, without long-term current use of insulin  (HCC) 04/16/2013   Last Assessment & Plan:  Labs today Pt reports well controlled on ambulatory monitoring   Vitamin D  deficiency 07/25/2018    Surgical History: Past Surgical History:   Procedure Laterality Date   ANTERIOR LAT LUMBAR FUSION N/A 02/06/2024   Procedure: ANTERIOR PRONE LATERAL LUMBAR FUSION LUMBAR FOUR-LUMBAR FIVE;  Surgeon: Darnella Dorn SAUNDERS, MD;  Location: [facility] OR;  Service: Neurosurgery;  Laterality: N/A;   ATRIAL FIBRILLATION ABLATION N/A 03/12/2019   Procedure: ATRIAL FIBRILLATION ABLATION;  Surgeon: Inocencio Soyla Lunger, MD;  Location: [facility] INVASIVE CV LAB;  Service: Cardiovascular;  Laterality: N/A;   ATRIAL FIBRILLATION ABLATION N/A 04/29/2020   Procedure: ATRIAL FIBRILLATION ABLATION;  Surgeon: Inocencio Soyla Lunger, MD;  Location: [facility] INVASIVE CV LAB;  Service: Cardiovascular;  Laterality: N/A;   CARDIAC ELECTROPHYSIOLOGY STUDY AND ABLATION     CARDIOVERSION     CARDIOVERSION N/A 10/10/2018   Procedure: CARDIOVERSION;  Surgeon: Delford Maude BROCKS, MD;  Location: [facility] ENDOSCOPY;  Service: Cardiovascular;  Laterality: N/A;   CARDIOVERSION N/A 05/18/2020   Procedure: CARDIOVERSION;  Surgeon: Alveta Aleene PARAS, MD;  Location: [facility] ENDOSCOPY;  Service: Cardiovascular;  Laterality: N/A;   CARDIOVERSION N/A 12/09/2020   Procedure: CARDIOVERSION;  Surgeon: Pietro Redell RAMAN, MD;  Location: [facility] ENDOSCOPY;  Service: Cardiovascular;  Laterality: N/A;   CHOLECYSTECTOMY     PROSTATECTOMY     REPLACEMENT TOTAL KNEE BILATERAL       Medications:  Current Medications[1]  Allergies: Allergies[2]  Sharlot Becker, NP     [1]  Current Facility-Administered Medications:    acetaminophen  (TYLENOL ) tablet 650 mg, 650 mg, Oral, Q6H PRN, 650 mg at 03/06/24 0907 **OR** acetaminophen  (TYLENOL ) suppository 650 mg, 650 mg, Rectal, Q6H PRN, Opyd, Evalene RAMAN, MD   allopurinol  (ZYLOPRIM ) tablet 300 mg, 300 mg, Oral, Daily, Barbarann Nest, MD, 300 mg at 03/06/24 9070   apixaban  (ELIQUIS ) tablet 5 mg, 5 mg, Oral, BID, Sreeram, Narendranath, MD, 5 mg at 03/06/24 9074   atorvastatin  (LIPITOR) tablet 20 mg, 20 mg, Oral, QHS, Crozier, Peyton, PA-C, 20 mg at 03/05/24 2254   bisacodyl  (DULCOLAX) EC  tablet 5 mg, 5 mg, Oral, Daily PRN, Opyd, Timothy S, MD   carvedilol  (COREG ) tablet 3.125 mg, 3.125 mg, Oral, BID, Crozier, Peyton, PA-C, 3.125 mg at 03/06/24 0929   ceFEPIme  (MAXIPIME ) 2 g in sodium chloride  0.9 % 100 mL IVPB, 2 g, Intravenous, Q8H, Vu, Trung T, MD, Last Rate: 200 mL/hr at 03/06/24 0528, 2 g at 03/06/24 9471   Chlorhexidine  Gluconate Cloth 2 % PADS 6 each, 6 each, Topical, Daily, Barbarann Nest, MD, 6 each at 03/05/24 1037   cyclobenzaprine  (FLEXERIL ) tablet 10 mg, 10 mg, Oral, TID PRN, Crozier, Peyton, PA-C, 10 mg at 02/28/24 2208   cycloSPORINE  (RESTASIS ) 0.05 % ophthalmic emulsion 1 drop, 1 drop, Both Eyes, Daily, Opyd, Evalene RAMAN, MD, 1 drop at 03/06/24 0941   DAPTOmycin  (CUBICIN ) 600 mg in sodium chloride  0.9 % IVPB, 8 mg/kg, Intravenous, Q1400, Vu, Trung T, MD, Stopped at 03/05/24 1528   flecainide  (TAMBOCOR ) tablet 100 mg, 100 mg, Oral, BID, Crozier, Peyton, PA-C, 100 mg at 03/06/24 9066   folic acid  (FOLVITE ) tablet 1 mg, 1 mg, Oral, Daily, Crozier, Peyton, PA-C, 1 mg at 03/06/24 9065   gabapentin  (NEURONTIN ) capsule 300 mg, 300 mg, Oral, Daily, Crozier, Peyton, PA-C, 300 mg at 03/05/24 1033   gabapentin  (NEURONTIN ) capsule 600 mg, 600 mg, Oral, QPM, Crozier, Peyton, PA-C, 600 mg at 03/05/24 2254   haloperidol  lactate (HALDOL ) injection 1-2 mg, 1-2 mg, Intravenous, Q6H PRN, Cheryle, Kshitiz, MD, 2 mg at 03/06/24 0919   hydrALAZINE  (APRESOLINE ) tablet 25 mg, 25 mg, Oral, BID, Crozier, Peyton, PA-C, 25 mg at 03/05/24 2254   hydrocortisone  cream 1 %, , Topical, TID, Jillian Buttery, MD, Given at 02/29/24 2135   insulin  aspart (novoLOG ) injection 0-6 Units, 0-6 Units, Subcutaneous, TID AC & HS, Barbarann Nest, MD,  1 Units at 03/05/24 1840   liver oil-zinc  oxide (DESITIN) 40 % ointment, , Topical, Daily PRN, Cheryle, Kshitiz, MD   montelukast  (SINGULAIR ) tablet 10 mg, 10 mg, Oral, QHS, Crozier, Peyton, PA-C, 10 mg at 03/05/24 2254   Oral care mouth rinse, 15 mL, Mouth Rinse, PRN,  Barbarann Nest, MD   oxybutynin  (DITROPAN ) tablet 5 mg, 5 mg, Oral, Q8H PRN, Cheryle, Kshitiz, MD, 5 mg at 03/06/24 9062   potassium chloride  SA (KLOR-CON  M) CR tablet 40 mEq, 40 mEq, Oral, Q4H, Alekh, Kshitiz, MD, 40 mEq at 03/06/24 9061   senna-docusate (Senokot-S) tablet 1 tablet, 1 tablet, Oral, QHS PRN, Opyd, Evalene RAMAN, MD   sodium chloride  flush (NS) 0.9 % injection 10-40 mL, 10-40 mL, Intracatheter, PRN, Opyd, Timothy S, MD   sodium chloride  flush (NS) 0.9 % injection 10-40 mL, 10-40 mL, Intracatheter, PRN, Barbarann Nest, MD   sodium chloride  flush (NS) 0.9 % injection 3 mL, 3 mL, Intravenous, Q12H, Opyd, Timothy S, MD, 3 mL at 03/06/24 1246   sodium chloride  tablet 1 g, 1 g, Oral, TID WC, Alekh, Kshitiz, MD, 1 g at 03/06/24 9068   tamsulosin  (FLOMAX ) capsule 0.4 mg, 0.4 mg, Oral, QPC supper, Barbarann Nest, MD, 0.4 mg at 03/05/24 1840   thiamine  (VITAMIN B1) tablet 100 mg, 100 mg, Oral, Daily, 100 mg at 03/06/24 0934 **OR** [DISCONTINUED] thiamine  (VITAMIN B1) injection 100 mg, 100 mg, Intravenous, Daily, Adhikari, Amrit, MD, 100 mg at 02/25/24 0935   torsemide  (DEMADEX ) tablet 20 mg, 20 mg, Oral, BID, Alekh, Kshitiz, MD, 20 mg at 03/06/24 0931 [2]  Allergies Allergen Reactions   Amlodipine  Nausea Only, Swelling and Other (See Comments)    Severe edema and chest pain- also   Ativan  [Lorazepam ] Other (See Comments)    Made the patient fidgety and he hallucinates   Chlorhexidine  Gluconate Itching and Other (See Comments)    Only itching with wipes, not with soap

## 2024-03-06 NOTE — Progress Notes (Addendum)
 Pt. was very confused and agitated this AM compared to when I had him yesterday on 03/05/24. He was making a lot of nonsensical comments. Stated he was off life support and dead,  going back in time, and going back in life. Tried to pull his catheter out. MD made aware. I was able to calm him down with new Rx of haldol , bladder spasm medication, and music.   Was a 3 assist from getting from chair back to bed.

## 2024-03-07 DIAGNOSIS — K6812 Psoas muscle abscess: Secondary | ICD-10-CM | POA: Diagnosis not present

## 2024-03-07 DIAGNOSIS — Z7189 Other specified counseling: Secondary | ICD-10-CM

## 2024-03-07 DIAGNOSIS — Z515 Encounter for palliative care: Secondary | ICD-10-CM | POA: Diagnosis not present

## 2024-03-07 LAB — CBC WITH DIFFERENTIAL/PLATELET
Abs Immature Granulocytes: 0.11 K/uL — ABNORMAL HIGH (ref 0.00–0.07)
Basophils Absolute: 0 K/uL (ref 0.0–0.1)
Basophils Relative: 1 %
Eosinophils Absolute: 0.5 K/uL (ref 0.0–0.5)
Eosinophils Relative: 9 %
HCT: 27.6 % — ABNORMAL LOW (ref 39.0–52.0)
Hemoglobin: 9.4 g/dL — ABNORMAL LOW (ref 13.0–17.0)
Immature Granulocytes: 2 %
Lymphocytes Relative: 23 %
Lymphs Abs: 1.4 K/uL (ref 0.7–4.0)
MCH: 29.6 pg (ref 26.0–34.0)
MCHC: 34.1 g/dL (ref 30.0–36.0)
MCV: 86.8 fL (ref 80.0–100.0)
Monocytes Absolute: 0.6 K/uL (ref 0.1–1.0)
Monocytes Relative: 10 %
Neutro Abs: 3.4 K/uL (ref 1.7–7.7)
Neutrophils Relative %: 55 %
Platelets: 129 K/uL — ABNORMAL LOW (ref 150–400)
RBC: 3.18 MIL/uL — ABNORMAL LOW (ref 4.22–5.81)
RDW: 16.9 % — ABNORMAL HIGH (ref 11.5–15.5)
WBC: 6.1 K/uL (ref 4.0–10.5)
nRBC: 0 % (ref 0.0–0.2)

## 2024-03-07 LAB — BASIC METABOLIC PANEL WITH GFR
Anion gap: 11 (ref 5–15)
BUN: 14 mg/dL (ref 8–23)
CO2: 24 mmol/L (ref 22–32)
Calcium: 9.8 mg/dL (ref 8.9–10.3)
Chloride: 95 mmol/L — ABNORMAL LOW (ref 98–111)
Creatinine, Ser: 1.31 mg/dL — ABNORMAL HIGH (ref 0.61–1.24)
GFR, Estimated: 55 mL/min — ABNORMAL LOW
Glucose, Bld: 114 mg/dL — ABNORMAL HIGH (ref 70–99)
Potassium: 3.4 mmol/L — ABNORMAL LOW (ref 3.5–5.1)
Sodium: 131 mmol/L — ABNORMAL LOW (ref 135–145)

## 2024-03-07 LAB — GLUCOSE, CAPILLARY
Glucose-Capillary: 117 mg/dL — ABNORMAL HIGH (ref 70–99)
Glucose-Capillary: 105 mg/dL — ABNORMAL HIGH (ref 70–99)
Glucose-Capillary: 163 mg/dL — ABNORMAL HIGH (ref 70–99)
Glucose-Capillary: 290 mg/dL — ABNORMAL HIGH (ref 70–99)

## 2024-03-07 LAB — MAGNESIUM: Magnesium: 1.8 mg/dL (ref 1.7–2.4)

## 2024-03-07 MED ORDER — SODIUM CHLORIDE 0.9 % IV SOLN
2.0000 g | Freq: Two times a day (BID) | INTRAVENOUS | Status: DC
  Administered 2024-03-07 – 2024-03-09 (×4): 2 g via INTRAVENOUS
  Filled 2024-03-07 (×4): qty 12.5

## 2024-03-07 MED ORDER — POTASSIUM CHLORIDE CRYS ER 20 MEQ PO TBCR
40.0000 meq | EXTENDED_RELEASE_TABLET | Freq: Once | ORAL | Status: AC
  Administered 2024-03-07: 40 meq via ORAL
  Filled 2024-03-07: qty 2

## 2024-03-07 NOTE — Consult Note (Signed)
 "                                                  Palliative Care Consult Note                                  Date: 03/07/2024   Patient Name: Charles Lloyd.  DOB: [birth year]  MRN: [id number]  Age / Sex: 84 y.o., male  PCP: Theophilus Andrews, Tully GRADE, MD Referring Physician: Cheryle Page, MD  Reason for Consultation: Establishing goals of care  HPI/Patient Profile: 84 y.o. male  with past medical history of HTN, HLD, T2DM, atrial fibrillation on Eliquis , CHF, CAD, gout, and  s/p recent L4-5 trans psoas lumbar interbody fusion left-sided approach on 02/06/2024 admitted on 02/22/2024 with leg pain, weakness, and falls. He was found to have a 3.5 x 2.4 cm L psoas muscle abscess.   Neurosurgery recommended IR drainage and antibiotics. Underwent CT-guided aspiration on 02/24/24. Developed hospital-associated delirium requiring ICU transfer, now improved.  Currently on daptomycin  and cefepime  till 04/13/2024 as per ID recommendations.  Psychiatry consulted on 03/06/2024 for intermittent agitation and as per family request.   Patient is awaiting placement for rehab- [facility].  Past Medical History:  Diagnosis Date   Acute tracheobronchitis 01/05/2019   B12 deficiency    Basal cell carcinoma (BCC) of left side of nose 01/28/2018   Cancer (HCC)    Cardiomyopathy (HCC) 03/10/2018   With chronic atrial fibrillation   CHF (congestive heart failure) (HCC) 04/16/2013   Functional class II, ejection fraction 35-40%  Formatting of this note might be different from the original. Functional class II, ejection fraction 35-40%   Chronic anticoagulation    Chronic diastolic (congestive) heart failure (HCC) 04/16/2013   Functional class II, ejection fraction 35-40%  Last Assessment & Plan:  Clinically stable Volume well controlled meds reviewed   Chronic diastolic CHF (congestive heart failure) (HCC) 04/16/2013   Functional class II, ejection fraction 35-40%  Last Assessment & Plan:   Clinically stable Volume well controlled meds reviewed   Colon polyps 04/16/2013   Coronary artery disease involving native coronary artery of native heart without angina pectoris 04/05/2022   Myoview  05/25/2019: EF 60, normal perfusion, low risk CCTA (AF protocol) 04/25/20: CAC score 4242 (99th percentile)   Diabetic neuropathy (HCC)    Difficult intubation    pt reports difficult intubation during surgery in [place] in 2015   Dysrhythmia    A.Fib   Eosinophil count raised 02/17/2019   Essential hypertension 01/06/2010   Last Assessment & Plan:  Well controlled Continue med management   Gout    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  Steroid usage was discontinued several months ago.  Follow up with Dermatology as planned.   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   Last Assessment & Plan:  Id following ?ongoing abx managementy reported Request records   Infection of prosthetic right knee  joint    Insomnia 03/04/2012   Grief with loss of wife 02/20/12  Last Assessment & Plan:  Improved sx  contineu xanax prn Se discussed   Lesion of liver 04/20/2019   -On cardiac CT 02/2019   Mild reactive airways disease    Neoplasm of prostate 04/16/2013   Neoplasm of prostate, 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 Transthoracic  Echocardiogram-03/10/2013-[facility]: Normal left ventricular wall thickness and cavity size.  Global left ventricular systolic function is moderately reduced.  The estimated ejection fraction is 35-40%.  The left atrium is moderately enlarged.  The right atrium is mildly enlarged.  No significant valvular    Overweight (BMI 25.0-29.9) 10/22/2016   Persistent atrial fibrillation (HCC) 04/16/2013   Last Assessment & Plan:  Rate controlled Continue med management eliquis  for stroke preventino  Formatting of this note might be different from the original.  Drug  HX Current Rx Pre-ABL inefficacy Pre-ABL intolerant Post-ABL inefficacy Post-ABL intolerant max dose/24h M/Y end comments  sotalol                  dofetilide                  flecainide                   propafenone                  am   S/P TKR (total knee replacement), bilateral    Secondary hypercoagulable state 04/09/2019   Tinea cruris 04/16/2013   Type 2 diabetes mellitus with diabetic polyneuropathy, without long-term current use of insulin  (HCC) 04/16/2013   Last Assessment & Plan:  Labs today Pt reports well controlled on ambulatory monitoring   Vitamin D  deficiency 07/25/2018    Subjective:   I have reviewed medical records including EPIC notes, labs and imaging, assessed the patient and then met with his son Thimothy to discuss diagnosis prognosis, GOC, EOL wishes, disposition and options.  I introduced Palliative Medicine as specialized medical care for people living with serious illness. It focuses on providing relief from symptoms and stress of a serious illness. The goal is to improve quality of life for both the patient and the family.  Today's Discussion: Patient lying down in bed. He appears comfortable and is just waking up. Son Rhylan is at bedside. After introducing palliative medicine son asks if we can speak privately. He believes this discussion may be too much for the patient today.  Met with patient's  son in the waiting room. Son has a good understanding of the patient's chronic conditions and acute hospitalization. We discussed the patient's left psoas abscess, urinary retention, chronic heart failure, a-fib, recurrent falls, and delirium. Patient's son is most concerned about the delirium because it is a very big change from the patient's baseline. The patient's son shared that the patient has historically had great health from his waist up. The majority of his concerns come from below the waist-- especially his pain, neuropathy, weakness, and recurrent falls. The patient's son is grateful psychiatry has seen the patient and given him a sleep aid. He slept all night until this afternoon. It is the longest the  patient has slept in some time.  Before his surgery in December the patient lived at home alone. He is widowed (wife passed 13 years ago from pancreatic cancer). He has two children and several grandchildren. The patient is a [place] veteran and retired from working at [employer]. After retirement he and his wife enjoyed travelling. Since his wife passed away the patient has been depressed and does not have many recreational activities. At home the patient has many assistive devices including walkers and a chair lift. Over the last few months the patient has had several falls which require his son coming to pick him back up. The patient is able to complete ADLs independently but requires assistance with driving/getting groceries. He is alert and oriented at baseline.   A discussion was had today regarding advanced directives. Patient's son shared he is HCPOA. Encouraged him to look for this document. He is unsure if the patient has a Living Will. We discussed concepts specific to code status and scopes of care. We discussed the difference between an aggressive medical intervention path and a comfort focused path. Encouraged patient's son to discuss goals of care with the patient when he is  able to participate. Told the son that PMT will check back in to see if there is a good time to discuss this hospitalization. Son confirms patient is full code and full scope at this time.  Son is hopeful patient's delirium will clear completely and that he will have meaningful recovery at rehab. Emotional support and therapeutic listening provided.   Discussed the importance of continued conversation with family and the medical providers regarding overall plan of care and treatment options, ensuring decisions are within the context of the patient's values and GOCs.  Questions and concerns were addressed. Hard Choices booklet left for review. The family was encouraged to call with questions or concerns. PMT will continue to support holistically.  Review of Systems  Unable to perform ROS   Objective:   Primary Diagnoses: Present on Admission:  Psoas abscess, left (HCC)  Paroxysmal A-fib (HCC)  NIDDM-2 with polyneuropathy and hyperglycemia  Hypercholesterolemia  Essential hypertension  Chronic heart failure with preserved ejection fraction (HFpEF) (HCC)  Asthma  Pressure injury  Acute on chronic urinary retention  Hyponatremia   Physical Exam Vitals reviewed.  Constitutional:      General: He is awake. He is not in acute distress. Cardiovascular:     Rate and Rhythm: Normal rate.  Pulmonary:     Effort: Pulmonary effort is normal.  Skin:    General: Skin is dry.     Vital Signs:  BP 128/62 (BP Location: Left Arm)   Pulse 91   Temp 98 F (36.7 C) (Oral)   Resp 16   Ht 5' 8 (1.727 m)   Wt 79.3 kg   SpO2 99%   BMI 26.58 kg/m    Advanced Care Planning:   Existing Vynca/ACP Documentation: None  Primary Decision Maker: PATIENT. If patient is unable to make decisions his son Nikolay Demetriou III is HCPOA/proxy management consultant.  Code Status/Advance Care Planning: Full code   Assessment & Plan:   SUMMARY OF RECOMMENDATIONS   Full code Full scope Encouraged GOC  discussion once patient is able Son will look for ACP documents- He knows a HCPOA has been completed but unsure if Living Will has been completed Continued PMT support   Discussed with: Dr. Cheryle  Time Total: 80 minutes    Thank you for allowing us  to  participate in the care of Charles Lloyd. PMT will continue to support holistically.   Signed by: Stephane Palin, NP Palliative Medicine Team  Team Phone # [phone] (Nights/Weekends)  03/07/2024, 2:50 PM   "

## 2024-03-07 NOTE — Progress Notes (Signed)
 PHARMACY NOTE:  ANTIMICROBIAL RENAL DOSAGE ADJUSTMENT  Current antimicrobial regimen includes a mismatch between antimicrobial dosage and estimated renal function.  As per policy approved by the Pharmacy & Therapeutics and Medical Executive Committees, the antimicrobial dosage will be adjusted accordingly.  Current antimicrobial dosage:  Cefepime  2g IV q8h  Indication: Psoas abscess  Renal Function: SCr increasing:  0.75, 0.9, 1.2, up to 1.3  Estimated Creatinine Clearance: 42.8 mL/min (A) (by C-G formula based on SCr of 1.31 mg/dL (H)).    Antimicrobial dosage has been changed to:  Cefepime  2g IV q12h   Thank you for allowing pharmacy to be a part of this patient's care.  Wanda Hasting PharmD, BCPS [facility] main pharmacy [phone] 03/07/2024 11:16 AM

## 2024-03-07 NOTE — Progress Notes (Signed)
 " PROGRESS NOTE    Charles Lloyd Charles Lloyd.  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 He was found to have a 3.5 x 2.4 cm L psoas muscle abscess. Neurosurgery recommended IR drainage and IV antibiotics.  Underwent CT-guided aspiration on 02/24/24. Developed hospital-associated delirium requiring ICU transfer, now improved.  Currently on daptomycin  and cefepime  till 04/13/2024 as per ID recommendations.  Psychiatry consulted on 03/06/2024 for intermittent agitation and as per family request.  Assessment & Plan:   Left psoas abscess -Had recent transverse lumbar interbody fusion on 02/06/2024. -S/p IR CT guided aspiration of left psoas fluid collection, drain placed.  No growth on fluid and blood cultures - Currently on daptomycin  and cefepime  till 04/13/2024 as per ID recommendations.  Currently has a PICC. -Awaiting placement.  TOC following.  Denied by insurance for CIR placement.  Delirium Alcohol  withdrawal syndrome with delirium Possible underlying dementia -MRI brain unremarkable -Off precedex  drip.  Completed phenobarbital  taper - Mental status improved.  Continue delirium precautions and frequent reorientation. - Avoid Ativan : Son was concerned about paradoxical response to Ativan .  Currently added as an allergy -Psychiatry consulted on 03/06/2024 for intermittent agitation and as per family request.  Continue Haldol  as needed.    Paroxysmal A-fib - Currently rate controlled.  Continue flecainide , Coreg  and Eliquis   Chronic diastolic heart failure Hypertension hyperlipidemia - Compensated.  Continue strict input and output.  Daily weights.  Continue hydralazine , Coreg .  Torsemide  resumed on 03/04/2024.  Hold torsemide  for today because of slightly increasing creatinine.  Outpatient  follow-up with cardiology - Continue statin  Diabetes mellitus type 2 with polyneuropathy and hyperglycemia - Recent A1c 5.9.  Blood sugars currently stable.  Continue CBGs with SSI.  Will not continue insulin  at discharge.  Continue gabapentin   Asthma - No exacerbation.  Continue montelukast   Recurrent falls -Awaiting rehab placement  Wound 02/24/24 1245 Pressure Injury Buttocks Mid;Lower Deep Tissue Pressure Injury - Purple or maroon localized area of discolored intact skin or blood-filled blister due to damage of underlying soft tissue from pressure and/or shear. (Active)     Wound 02/24/24 1245 Pressure Injury Scrotum Circumferential Deep Tissue Pressure Injury - Purple or maroon localized area of discolored intact skin or blood-filled blister due to damage of underlying soft tissue from pressure and/or shear. (Active)  Present to admission: Continue local wound care  Acute on chronic urinary retention - Failed voiding trial on 03/04/2024 and Foley catheter had to be replaced on 03/04/2024.  Continue Flomax .  Outpatient follow-up with urology  Hyponatremia - Sodium slightly improving to 131 today.  Diuretic plan as above.  Continue salt tablets. Monitor  Hypokalemia - Replace.  Repeat a.m. labs  Hypomagnesemia - Improved  Anemia of chronic disease - From chronic illnesses.  Hemoglobin stable.  Monitor intermittently  Thrombocytopenia -Questionable cause.  No signs of bleeding.  Monitor  Goals of care - Overall prognosis is guarded to poor.  Palliative care evaluation pending.  DVT prophylaxis: Eliquis  Code Status: Full Family Communication: Son at bedside  Disposition Plan: Status is: Inpatient Remains inpatient appropriate because: Of severity of illness.  Need for SNF placement    Consultants: Neurosurgery/IR/PCCM/ID.  Psychiatry.  palliative care  Procedures: As above  Antimicrobials:  Anti-infectives (From admission, onward)    Start     Dose/Rate Route  Frequency Ordered Stop   02/28/24 2200  ceFEPIme  (MAXIPIME ) 2 g in sodium chloride  0.9 % 100 mL IVPB        2 g 200 mL/hr over 30 Minutes Intravenous Every 8 hours 02/28/24 1440     02/28/24 1530  DAPTOmycin  (CUBICIN ) 600 mg in sodium chloride  0.9 % IVPB        8 mg/kg  79.3 kg 124 mL/hr over 30 Minutes Intravenous Daily 02/28/24 1440     02/28/24 0000  daptomycin  (CUBICIN ) IVPB        600 mg Intravenous Every 24 hours 02/28/24 1444 04/10/24 2359   02/28/24 0000  ceFEPime  (MAXIPIME ) IVPB        2 g Intravenous Every 8 hours 02/28/24 1444 04/10/24 2359   02/23/24 2200  vancomycin  (VANCOREADY) IVPB 1500 mg/300 mL  Status:  Discontinued        1,500 mg 150 mL/hr over 120 Minutes Intravenous Every 24 hours 02/22/24 2348 02/23/24 0242   02/23/24 1000  linezolid  (ZYVOX ) IVPB 600 mg  Status:  Discontinued       Note to Pharmacy: Pt developed rash shortly after vancomycin  administration.   600 mg 300 mL/hr over 60 Minutes Intravenous Every 12 hours 02/23/24 0242 02/28/24 1440   02/23/24 0800  piperacillin -tazobactam (ZOSYN ) IVPB 3.375 g  Status:  Discontinued        3.375 g 12.5 mL/hr over 240 Minutes Intravenous Every 8 hours 02/22/24 2348 02/28/24 1440   02/22/24 2315  piperacillin -tazobactam (ZOSYN ) IVPB 3.375 g        3.375 g 100 mL/hr over 30 Minutes Intravenous  Once 02/22/24 2306 02/23/24 0013   02/22/24 2315  vancomycin  (VANCOREADY) IVPB 1500 mg/300 mL        1,500 mg 150 mL/hr over 120 Minutes Intravenous  Once 02/22/24 2306 02/23/24 0141        Subjective: Patient seen and examined at bedside.  Poor historian.  Patient had to receive Haldol  yesterday as per nursing staff for intermittent agitation.  No fever, seizures or vomiting reported.   Objective: Vitals:   03/06/24 0523 03/06/24 1448 03/06/24 2025 03/07/24 0610  BP: (!) 120/56 (!) 127/58 (!) 140/73 128/62  Pulse: 85 74 81 91  Resp:   16 16  Temp: 99.1 F (37.3 C) 97.6 F (36.4 C) (!) 97.4 F (36.3 C) 98 F (36.7  C)  TempSrc: Oral Oral Oral Oral  SpO2: 96% 98% 100% 99%  Weight:      Height:        Intake/Output Summary (Last 24 hours) at 03/07/2024 0815 Last data filed at 03/07/2024 0500 Gross per 24 hour  Intake 600 ml  Output 4700 ml  Net -4100 ml   Filed Weights   02/22/24 1238 02/24/24 1245  Weight: 79.4 kg 79.3 kg    Examination:  General: On room air.  No distress.  Elderly male lying in bed. ENT/neck: No palpable thyromegaly or JVD elevation noted  respiratory: Decreased breath sounds at bases bilaterally with some crackles CVS: S 1 S2 heard; currently rate controlled  abdominal: Soft, nontender, distended slightly; no organomegaly, bowel sounds normally heard  extremities: Trace lower extremity edema present; no cyanosis  CNS: Pleasantly confused.  Still slow to respond.  Poor historian.  No focal deficits noted  lymph: No obvious lymphadenopathy palpable Skin: No obvious ecchymosis/lesions psych: Not agitated currently.  Flat affect genitourinary: Foley catheter present. musculoskeletal: No obvious joint swelling/deformity    Data Reviewed: I have personally reviewed following labs and  imaging studies  CBC: Recent Labs  Lab 03/02/24 1059 03/03/24 0950 03/04/24 0408 03/05/24 0306 03/07/24 0350  WBC 4.8 4.7 4.3 4.4 6.1  NEUTROABS  --   --   --  2.3 3.4  HGB 9.7* 9.2* 8.1* 9.2* 9.4*  HCT 28.8* 26.8* 24.5* 26.7* 27.6*  MCV 88.1 86.7 88.1 85.9 86.8  PLT 104* 90* 80* 95* 129*   Basic Metabolic Panel: Recent Labs  Lab 03/03/24 0950 03/04/24 0408 03/05/24 0306 03/06/24 0434 03/07/24 0350  NA 126* 125* 126* 130* 131*  K 3.5 3.3* 3.3* 3.2* 3.4*  CL 97* 96* 94* 97* 95*  CO2 20* 21* 23 24 24   GLUCOSE 104* 90 108* 105* 114*  BUN 11 10 11 12 14   CREATININE 0.74 0.75 0.90 1.20 1.31*  CALCIUM  8.8* 8.4* 9.3 9.2 9.8  MG  --   --  1.6* 1.7 1.8   GFR: Estimated Creatinine Clearance: 42.8 mL/min (A) (by C-G formula based on SCr of 1.31 mg/dL (H)). Liver Function  Tests: No results for input(s): AST, ALT, ALKPHOS, BILITOT, PROT, ALBUMIN  in the last 168 hours. No results for input(s): LIPASE, AMYLASE in the last 168 hours. No results for input(s): AMMONIA in the last 168 hours. Coagulation Profile: No results for input(s): INR, PROTIME in the last 168 hours. Cardiac Enzymes: No results for input(s): CKTOTAL, CKMB, CKMBINDEX, TROPONINI in the last 168 hours.  BNP (last 3 results) No results for input(s): PROBNP in the last 8760 hours. HbA1C: No results for input(s): HGBA1C in the last 72 hours. CBG: Recent Labs  Lab 03/06/24 0736 03/06/24 1120 03/06/24 1646 03/06/24 2025 03/07/24 0803  GLUCAP 106* 108* 133* 134* 117*   Lipid Profile: No results for input(s): CHOL, HDL, LDLCALC, TRIG, CHOLHDL, LDLDIRECT in the last 72 hours. Thyroid  Function Tests: No results for input(s): TSH, T4TOTAL, FREET4, T3FREE, THYROIDAB in the last 72 hours. Anemia Panel: No results for input(s): VITAMINB12, FOLATE, FERRITIN, TIBC, IRON, RETICCTPCT in the last 72 hours. Sepsis Labs: No results for input(s): PROCALCITON, LATICACIDVEN in the last 168 hours.  No results found for this or any previous visit (from the past 240 hours).        Radiology Studies: No results found.      Scheduled Meds:  allopurinol   300 mg Oral Daily   apixaban   5 mg Oral BID   atorvastatin   20 mg Oral QHS   carvedilol   3.125 mg Oral BID   Chlorhexidine  Gluconate Cloth  6 each Topical Daily   cycloSPORINE   1 drop Both Eyes Daily   flecainide   100 mg Oral BID   folic acid   1 mg Oral Daily   gabapentin   300 mg Oral Daily   gabapentin   600 mg Oral QPM   hydrALAZINE   25 mg Oral BID   hydrocortisone  cream   Topical TID   insulin  aspart  0-6 Units Subcutaneous TID AC & HS   montelukast   10 mg Oral QHS   ramelteon   8 mg Oral QHS   sodium chloride  flush  3 mL Intravenous Q12H   sodium chloride   1 g Oral  TID WC   tamsulosin   0.4 mg Oral QPC supper   thiamine   100 mg Oral Daily   torsemide   20 mg Oral BID   Continuous Infusions:  ceFEPime  (MAXIPIME ) IV 2 g (03/07/24 0604)   DAPTOmycin  600 mg (03/06/24 1626)          Sophie Mao, MD [facility] 03/07/2024, 8:15 AM   "

## 2024-03-07 NOTE — Consult Note (Signed)
 [facility] Consult Follow Up  Patient Name: .Nazaire Cordial.  MRN: [id number]  DOB: 04-08-1940  Consult Order details:  Orders (From admission, onward)     Start     Ordered   03/06/24 1141  IP CONSULT TO PSYCHIATRY       Ordering Provider: Cheryle Page, MD  Provider:  (Not yet assigned)  Question Answer Comment  Location [facility]   Reason for Consult? Agitation/depression; family requesting psych eval      03/06/24 1140             Mode of Visit: In person    Psychiatry Consult Evaluation  Service Date: March 07, 2024 LOS:  LOS: 14 days  Chief Complaint Family concerned over agitation and depression.  Primary Psychiatric Diagnoses  Stress reaction with mixed disturbance of emotion and conduct Alcohol  withdrawal Insomnia  Assessment  Dajon Lazar. is a 84 y.o. male admitted: Presented to the EDfor 02/22/2024  1:07 PM for fall at home. He carries the psychiatric diagnoses of none and has a past medical history of  multiple medical issues.   His current presentation of confusion is most consistent with stress reaction with mixed disturbance of emotion and conduct. He meets criteria for stress reaction based on change in environment and multiple medical and physical stressors and lengthy hospital stay. Disorientation and confusion fluctuate.  On initial examination, patient was oriented X4 but staff report some confusion and agitation over the past 1-2 days. Please see plan below for detailed recommendations.   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)   NIDDM-2 with polyneuropathy and hyperglycemia   Hypercholesterolemia   Hyponatremia   Asthma   Essential hypertension   Recurrent falls   Altered  mental status   Alcohol  withdrawal syndrome, with delirium (HCC)   Delirium   Pressure injury   Acute on chronic urinary retention    Plan   ## Psychiatric Medication Recommendations:  Ramelteon  8mg  at bedtime  ## Further Work-up:  -- most recent EKG on 02/29/24 had QtC of 595, consistent with previous reports -- Pertinent labwork reviewed earlier this admission includes: hyponatremia and anemia   ## Disposition:-- There are no psychiatric contraindications to discharge at this time  ## Behavioral / Environmental: -Delirium Precautions: Delirium Interventions for Nursing and Staff: - RN to open blinds every AM. - To Bedside: Glasses, hearing aide, and pt's own shoes. Make available to patients. when possible and encourage use. - Encourage po fluids when appropriate, keep fluids within reach. - OOB to chair with meals. - Passive ROM exercises to all extremities with AM & PM care. - RN to assess orientation to person, time and place QAM and PRN. - Recommend extended visitation hours with familiar family/friends as feasible. - Staff to minimize disturbances at night. Turn off television and keep door closed to decrease external stimuli.    ## Safety and Observation Level:  - Based on my clinical evaluation, I estimate the patient to be at minimal risk of self harm in the current setting. - At this time, we recommend  routine. This decision is based on my review of the chart including patient's history and current presentation, interview of the patient, mental status examination, and consideration of suicide risk including evaluating suicidal ideation,  plan, intent, suicidal or self-harm behaviors, risk factors, and protective factors. This judgment is based on our ability to directly address suicide risk, implement suicide prevention strategies, and develop a safety plan while the patient is in the clinical setting. Please contact our team if there is a concern that risk level has  changed.  CSSR Risk Category:C-SSRS RISK CATEGORY: No Risk  Suicide Risk Assessment: Patient has following modifiable risk factors for suicide: untreated depression, which we are addressing by starting sleep medication and working toward rehab plan. Patient has following non-modifiable //or demographic risk factors for suicide: early widowhood Patient has the following protective factors against suicide: Supportive family and Supportive friends  Thank you for this consult request. Recommendations have been communicated to the primary team.  We will continue following him at this time.   Sharlot Becker, NP       History of Present Illness  Relevant Aspects of [facility] Course:  Admitted on 02/22/2024 for recurrent falls.   Patient Report:  Patient family and staff report worsening symptoms of depression and agitation over the last couple days. He states he does not know what is going on but upon further questioning it seems he is not confused but describing all the new providers and staff coming in and out of his room. He became frustrated when he was trying to call his son and locked himself out of his phone (his phone has a new iOS update). He has currently been in this facility fo 13 days and planned to go to rehab facility today which was postponed due to his confusion at times. He is disoriented at times looking for his wife of 40 years who passed 13 years ago (per son). He is easily reoriented to situation. Nurse reports he was emotional yesterday and crying and after a dose of Haldol  and was less agitated with the ability to rest. She states he did not like watching baseball and likes music so she played some for him. He denies any thoughts of harm to self or others, denies auditory or visual hallucinations. Abenezer did request something to help him sleep as he is struggling to obtain sleep in this new environment.  He is motivated to get to rehab as planned and back home to his  routine, medication ordered after permission from his son obtained.  Caveat:  Finnian got tearful when he expressed that he was totally himself above the waists but below the waist were tubes and other things.  Encouragement provided that rehab would assist him in regaining his physical strength.    This provider spoke with his son, Brayant Dorr, who was concerned as he dad was clear and coherent until his MRI after he was given Ativan .  His behaviors continued in the ICU with mitts being needed and only stopped when he was changed to phenobarbital  versus Ativan .  Discussed his father has been through many physical stressors in the past 13 days with his routine and environment change that is triggering his disorientation.  It was noted that lower stimulation, like having his door closed, assists with decreasing his anxiety and confusion which this provider witnessed.  Discussed sleep medication of Rozerem  to assist with sleep and not interfere with his QTc interval that is prolonged.  He was agreeable for him to start it, order placed.  03/07/2024: Patient was sleeping soundly. Spoke to his son outside the room. He states he slept all night and has not seen him sleep like this since admission. He  would like to continue the Rozerem  tonight and at the rehab facility. He did get another dose of Haldol  last night. I would recommend holding the IV med due to his extensive cardiac history and high QTC, discontinued. He did well yesterday during my visit with redirection and decreased external stimuli (closed door).   Psych ROS:  Depression: yes Anxiety:  yes Mania (lifetime and current): none Psychosis: (lifetime and current): none  Collateral information:  Contacted from son Buryl Bamber) at 2pm on 03/07/24, see note above.  Review of Systems  All other systems reviewed and are negative.    Psychiatric and Social History  Psychiatric History:  Information collected from Patient, nurse, daughter in  law, son.  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, plan to go to rehab  Access to weapons/lethal means: will assess   Substance History Alcohol : 4-5 drinks per day prior to admission  Type of alcohol  liquor  Last Drink 13 days ago prior to admission Number of drinks per day 4-5 History of alcohol  withdrawal seizures none History of DT's none Tobacco: none   Exam Findings  Physical Exam: by MD, reviewed Vital Signs:  Temp:  [97.4 F (36.3 C)-98 F (36.7 C)] 98 F (36.7 C) (01/10 0610) Pulse Rate:  [74-91] 91 (01/10 0610) Resp:  [16] 16 (01/10 0610) BP: (127-140)/(58-73) 128/62 (01/10 0610) SpO2:  [98 %-100 %] 99 % (01/10 0610) Blood pressure 128/62, pulse 91, temperature 98 F (36.7 C), temperature source Oral, resp. rate 16, height 5' 8 (1.727 m), weight 79.3 kg, SpO2 99%. Body mass index is 26.58 kg/m.  Physical Exam  Mental Status Exam: General Appearance: Casual  Orientation:  Full (Time, Place, and Person)  Memory:  Immediate;   Fair Recent;   Fair Remote;   Fair  Concentration:  Concentration: Fair and Attention Span: Fair  Recall:  Fair  Attention  Fair  Eye Contact:  Good  Speech:  Clear and Coherent  Language:  Good  Volume:  Normal  Mood: Anxious   Affect:  Congruent  Thought Process:  Goal Directed  Thought Content:  Logical  Suicidal Thoughts:  No  Homicidal Thoughts:  No  Judgement:  Good  Insight:  Fair  Psychomotor Activity:  Normal   Akathisia:  No  Fund of Knowledge:  Good      Assets:  Desire for Improvement Financial Resources/Insurance Housing Resilience Social  Support Transportation  Cognition: WDL  ADL's:  Intact  AIMS (if indicated):        Other History   These have been pulled in through the EMR, reviewed, and updated if appropriate.  Family History:  The patient's family history includes Cancer in his father and mother; Depression in his father and mother; Early death in his father and mother.  Medical History: Past Medical History:  Diagnosis Date   Acute tracheobronchitis 01/05/2019   B12 deficiency    Basal cell carcinoma (BCC) of left side of nose 01/28/2018   Cancer (HCC)    Cardiomyopathy (HCC) 03/10/2018   With chronic atrial fibrillation   CHF (congestive heart failure) (HCC) 04/16/2013   Functional class II, ejection fraction 35-40%  Formatting of this note  might be different from the original. Functional class II, ejection fraction 35-40%   Chronic anticoagulation    Chronic diastolic (congestive) heart failure (HCC) 04/16/2013   Functional class II, ejection fraction 35-40%  Last Assessment & Plan:  Clinically stable Volume well controlled meds reviewed   Chronic diastolic CHF (congestive heart failure) (HCC) 04/16/2013   Functional class II, ejection fraction 35-40%  Last Assessment & Plan:  Clinically stable Volume well controlled meds reviewed   Colon polyps 04/16/2013   Coronary artery disease involving native coronary artery of native heart without angina pectoris 04/05/2022   Myoview  05/25/2019: EF 60, normal perfusion, low risk CCTA (AF protocol) 04/25/20: CAC score 4242 (99th percentile)   Diabetic neuropathy (HCC)    Difficult intubation    pt reports difficult intubation during surgery in [place] in 2015   Dysrhythmia    A.Fib   Eosinophil count raised 02/17/2019   Essential hypertension 01/06/2010   Last Assessment & Plan:  Well controlled Continue med management   Gout    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  Steroid usage was discontinued  several months ago.  Follow up with Dermatology as planned.   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   Last Assessment & Plan:  Id following ?ongoing abx managementy reported Request records   Infection of prosthetic right knee joint    Insomnia 03/04/2012   Grief with loss of wife 02/20/12  Last Assessment & Plan:  Improved sx  contineu xanax prn Se discussed   Lesion of liver 04/20/2019   -On cardiac CT 02/2019   Mild reactive airways disease    Neoplasm of prostate 04/16/2013   Neoplasm of prostate, 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 Transthoracic Echocardiogram-03/10/2013-[facility]: Normal left ventricular wall thickness and cavity size.  Global left ventricular systolic function is moderately reduced.  The estimated ejection fraction is 35-40%.  The left atrium is moderately enlarged.  The right atrium is mildly enlarged.  No significant valvular    Overweight (BMI 25.0-29.9) 10/22/2016   Persistent atrial fibrillation (HCC) 04/16/2013   Last Assessment & Plan:  Rate controlled Continue med management eliquis  for stroke preventino  Formatting of this note might be different from the original.  Drug  HX Current Rx Pre-ABL inefficacy Pre-ABL intolerant Post-ABL inefficacy Post-ABL intolerant max dose/24h M/Y end comments  sotalol                  dofetilide                   flecainide                   propafenone                  am   S/P TKR (total knee replacement), bilateral    Secondary hypercoagulable state 04/09/2019  Tinea cruris 04/16/2013   Type 2 diabetes mellitus with diabetic polyneuropathy, without long-term current use of insulin  (HCC) 04/16/2013   Last Assessment & Plan:  Labs today Pt reports well controlled on ambulatory monitoring   Vitamin D  deficiency 07/25/2018    Surgical History: Past Surgical History:  Procedure Laterality Date   ANTERIOR LAT LUMBAR FUSION N/A 02/06/2024   Procedure: ANTERIOR PRONE LATERAL LUMBAR FUSION LUMBAR FOUR-LUMBAR FIVE;  Surgeon: Darnella Dorn SAUNDERS, MD;  Location: [facility] OR;  Service: Neurosurgery;  Laterality: N/A;   ATRIAL FIBRILLATION ABLATION N/A 03/12/2019   Procedure: ATRIAL FIBRILLATION ABLATION;  Surgeon: Inocencio Soyla Lunger, MD;  Location: [facility] INVASIVE CV LAB;  Service: Cardiovascular;  Laterality: N/A;   ATRIAL FIBRILLATION ABLATION N/A 04/29/2020   Procedure: ATRIAL FIBRILLATION ABLATION;  Surgeon: Inocencio Soyla Lunger, MD;  Location: [facility] INVASIVE CV LAB;  Service: Cardiovascular;  Laterality: N/A;   CARDIAC ELECTROPHYSIOLOGY STUDY AND ABLATION     CARDIOVERSION     CARDIOVERSION N/A 10/10/2018   Procedure: CARDIOVERSION;  Surgeon: Delford Maude BROCKS, MD;  Location: [facility] ENDOSCOPY;  Service: Cardiovascular;  Laterality: N/A;   CARDIOVERSION N/A 05/18/2020   Procedure: CARDIOVERSION;  Surgeon: Alveta Aleene PARAS, MD;  Location: [facility] ENDOSCOPY;  Service: Cardiovascular;  Laterality: N/A;   CARDIOVERSION N/A 12/09/2020   Procedure: CARDIOVERSION;  Surgeon: Pietro Redell RAMAN, MD;  Location: [facility] ENDOSCOPY;  Service: Cardiovascular;  Laterality: N/A;   CHOLECYSTECTOMY     PROSTATECTOMY     REPLACEMENT TOTAL KNEE BILATERAL       Medications:  Current Medications[1]  Allergies: Allergies[2]  Sharlot Becker, NP      [1]  Current Facility-Administered Medications:    acetaminophen  (TYLENOL ) tablet 650 mg,  650 mg, Oral, Q6H PRN, 650 mg at 03/06/24 1503 **OR** acetaminophen  (TYLENOL ) suppository 650 mg, 650 mg, Rectal, Q6H PRN, Opyd, Evalene RAMAN, MD   allopurinol  (ZYLOPRIM ) tablet 300 mg, 300 mg, Oral, Daily, Barbarann Nest, MD, 300 mg at 03/06/24 9070   apixaban  (ELIQUIS ) tablet 5 mg, 5 mg, Oral, BID, Sreeram, Narendranath, MD, 5 mg at 03/06/24 2134   atorvastatin  (LIPITOR) tablet 20 mg, 20 mg, Oral, QHS, Crozier, Peyton, PA-C, 20 mg at 03/06/24 2134   bisacodyl  (DULCOLAX) EC tablet 5 mg, 5 mg, Oral, Daily PRN, Opyd, Timothy S, MD   carvedilol  (COREG ) tablet 3.125 mg, 3.125 mg, Oral, BID, Crozier, Peyton, PA-C, 3.125 mg at 03/06/24 2134   ceFEPIme  (MAXIPIME ) 2 g in sodium chloride  0.9 % 100 mL IVPB, 2 g, Intravenous, Q8H, Vu, Trung T, MD, Last Rate: 200 mL/hr at 03/07/24 0604, 2 g at 03/07/24 0604   Chlorhexidine  Gluconate Cloth 2 % PADS 6 each, 6 each, Topical, Daily, Barbarann Nest, MD, 6 each at 03/06/24 1801   cyclobenzaprine  (FLEXERIL ) tablet 10 mg, 10 mg, Oral, TID PRN, Crozier, Peyton, PA-C, 10 mg at 03/06/24 1503   cycloSPORINE  (RESTASIS ) 0.05 % ophthalmic emulsion 1 drop, 1 drop, Both Eyes, Daily, Opyd, Timothy S, MD, 1 drop at 03/06/24 0941   DAPTOmycin  (CUBICIN ) 600 mg in sodium chloride  0.9 % IVPB, 8 mg/kg, Intravenous, Q1400, Vu, Trung T, MD, Last Rate: 124 mL/hr at 03/06/24 1626, 600 mg at 03/06/24 1626   flecainide  (TAMBOCOR ) tablet 100 mg, 100 mg, Oral, BID, Crozier, Peyton, PA-C, 100 mg at 03/06/24 2135   folic acid  (FOLVITE ) tablet 1 mg, 1 mg, Oral, Daily, Crozier, Peyton, PA-C, 1 mg at 03/06/24 9065   gabapentin  (NEURONTIN ) capsule 300 mg, 300 mg, Oral, Daily, Crozier, Peyton, PA-C, 300 mg at 03/05/24 [phone]  gabapentin  (NEURONTIN ) capsule 600 mg, 600 mg, Oral, QPM, Crozier, Peyton, PA-C, 600 mg at 03/06/24 2134   haloperidol  lactate (HALDOL ) injection 1-2 mg, 1-2 mg, Intravenous, Q6H PRN, Cheryle, Kshitiz, MD, 2 mg at 03/06/24 1617   hydrALAZINE  (APRESOLINE ) tablet 25 mg, 25 mg, Oral,  BID, Crozier, Peyton, PA-C, 25 mg at 03/06/24 2134   hydrocortisone  cream 1 %, , Topical, TID, Jillian Buttery, MD, Given at 03/06/24 2136   insulin  aspart (novoLOG ) injection 0-6 Units, 0-6 Units, Subcutaneous, TID AC & HS, Barbarann Nest, MD, 1 Units at 03/05/24 1840   liver oil-zinc  oxide (DESITIN) 40 % ointment, , Topical, Daily PRN, Cheryle, Kshitiz, MD   montelukast  (SINGULAIR ) tablet 10 mg, 10 mg, Oral, QHS, Crozier, Peyton, PA-C, 10 mg at 03/06/24 2134   Oral care mouth rinse, 15 mL, Mouth Rinse, PRN, Barbarann Nest, MD   oxybutynin  (DITROPAN ) tablet 5 mg, 5 mg, Oral, Q8H PRN, Cheryle, Kshitiz, MD, 5 mg at 03/06/24 2134   potassium chloride  SA (KLOR-CON  M) CR tablet 40 mEq, 40 mEq, Oral, Once, Alekh, Kshitiz, MD   ramelteon  (ROZEREM ) tablet 8 mg, 8 mg, Oral, QHS, Heela Heishman Y, NP, 8 mg at 03/06/24 2135   senna-docusate (Senokot-S) tablet 1 tablet, 1 tablet, Oral, QHS PRN, Opyd, Evalene RAMAN, MD   sodium chloride  flush (NS) 0.9 % injection 10-40 mL, 10-40 mL, Intracatheter, PRN, Opyd, Timothy S, MD   sodium chloride  flush (NS) 0.9 % injection 10-40 mL, 10-40 mL, Intracatheter, PRN, Barbarann Nest, MD   sodium chloride  flush (NS) 0.9 % injection 3 mL, 3 mL, Intravenous, Q12H, Opyd, Timothy S, MD, 3 mL at 03/06/24 2135   sodium chloride  tablet 1 g, 1 g, Oral, TID WC, Alekh, Kshitiz, MD, 1 g at 03/06/24 1743   tamsulosin  (FLOMAX ) capsule 0.4 mg, 0.4 mg, Oral, QPC supper, Barbarann Nest, MD, 0.4 mg at 03/06/24 1743   thiamine  (VITAMIN B1) tablet 100 mg, 100 mg, Oral, Daily, 100 mg at 03/06/24 0934 **OR** [DISCONTINUED] thiamine  (VITAMIN B1) injection 100 mg, 100 mg, Intravenous, Daily, Adhikari, Amrit, MD, 100 mg at 02/25/24 0935 [2]  Allergies Allergen Reactions   Amlodipine  Nausea Only, Swelling and Other (See Comments)    Severe edema and chest pain- also   Ativan  [Lorazepam ] Other (See Comments)    Made the patient fidgety and he hallucinates   Chlorhexidine  Gluconate Itching and Other  (See Comments)    Only itching with wipes, not with soap

## 2024-03-08 DIAGNOSIS — Z7189 Other specified counseling: Secondary | ICD-10-CM | POA: Diagnosis not present

## 2024-03-08 DIAGNOSIS — K6812 Psoas muscle abscess: Secondary | ICD-10-CM | POA: Diagnosis not present

## 2024-03-08 DIAGNOSIS — F10230 Alcohol dependence with withdrawal, uncomplicated: Secondary | ICD-10-CM | POA: Diagnosis not present

## 2024-03-08 DIAGNOSIS — Z515 Encounter for palliative care: Secondary | ICD-10-CM | POA: Diagnosis not present

## 2024-03-08 DIAGNOSIS — G4700 Insomnia, unspecified: Secondary | ICD-10-CM | POA: Diagnosis not present

## 2024-03-08 DIAGNOSIS — F4389 Other reactions to severe stress: Secondary | ICD-10-CM | POA: Diagnosis not present

## 2024-03-08 LAB — BASIC METABOLIC PANEL WITH GFR
Anion gap: 13 (ref 5–15)
BUN: 19 mg/dL (ref 8–23)
CO2: 23 mmol/L (ref 22–32)
Calcium: 9.6 mg/dL (ref 8.9–10.3)
Chloride: 97 mmol/L — ABNORMAL LOW (ref 98–111)
Creatinine, Ser: 1.58 mg/dL — ABNORMAL HIGH (ref 0.61–1.24)
GFR, Estimated: 44 mL/min — ABNORMAL LOW
Glucose, Bld: 109 mg/dL — ABNORMAL HIGH (ref 70–99)
Potassium: 3.6 mmol/L (ref 3.5–5.1)
Sodium: 133 mmol/L — ABNORMAL LOW (ref 135–145)

## 2024-03-08 LAB — MAGNESIUM: Magnesium: 1.9 mg/dL (ref 1.7–2.4)

## 2024-03-08 LAB — GLUCOSE, CAPILLARY
Glucose-Capillary: 109 mg/dL — ABNORMAL HIGH (ref 70–99)
Glucose-Capillary: 117 mg/dL — ABNORMAL HIGH (ref 70–99)
Glucose-Capillary: 109 mg/dL — ABNORMAL HIGH (ref 70–99)

## 2024-03-08 MED ORDER — SODIUM CHLORIDE 0.9 % IV SOLN: INTRAVENOUS | Status: DC

## 2024-03-08 NOTE — Consult Note (Signed)
 [facility]  Patient Name: .Charles Lloyd.  MRN: [id number]  DOB: [birth year]  Consult Order details:  Orders (From admission, onward)     Start     Ordered   03/06/24 1141  IP CONSULT TO PSYCHIATRY       Ordering Provider: Cheryle Page, MD  Provider:  (Not yet assigned)  Question Answer Comment  Location [facility]   Reason for Consult? Agitation/depression; family requesting psych eval      03/06/24 1140             Mode of Visit: In person    Psychiatry Consult Evaluation  Service Date: March 08, 2024 LOS:  LOS: 15 days  Chief Complaint Family concerned over agitation and depression.  Primary Psychiatric Diagnoses  Stress reaction with mixed disturbance of emotion and conduct Alcohol  withdrawal Insomnia  Assessment  Charles Lloyd. is a 84 y.o. male admitted: Presented to the EDfor 02/22/2024  1:07 PM for fall at home. He carries the psychiatric diagnoses of none and has a past medical history of  multiple medical issues.   His current presentation of confusion is most consistent with stress reaction with mixed disturbance of emotion and conduct. He meets criteria for stress reaction based on change in environment and multiple medical and physical stressors and lengthy hospital stay. Disorientation and confusion fluctuate.  On initial examination, patient was oriented X4 but staff report some confusion and agitation over the past 1-2 days. Please see plan below for detailed recommendations.   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)   NIDDM-2 with polyneuropathy and hyperglycemia   Hypercholesterolemia   Hyponatremia   Asthma   Essential hypertension   Recurrent falls   Altered  mental status   Alcohol  withdrawal syndrome, with delirium (HCC)   Delirium   Pressure injury   Acute on chronic urinary retention    Plan   ## Psychiatric Medication Recommendations:  Ramelteon  8mg  at bedtime  ## Further Work-up:  -- most recent EKG on 02/29/24 had QtC of 595, consistent with previous reports -- Pertinent labwork reviewed earlier this admission includes: hyponatremia and anemia  ## Disposition:-- There are no psychiatric contraindications to discharge at this time  ## Behavioral / Environmental: -Delirium Precautions: Delirium Interventions for Nursing and Staff: - RN to open blinds every AM. - To Bedside: Glasses, hearing aide, and pt's own shoes. Make available to patients. when possible and encourage use. - Encourage po fluids when appropriate, keep fluids within reach. - OOB to chair with meals. - Passive ROM exercises to all extremities with AM & PM care. - RN to assess orientation to person, time and place QAM and PRN. - Recommend extended visitation hours with familiar family/friends as feasible. - Staff to minimize disturbances at night. Turn off television and keep door closed to decrease external stimuli.   ## Safety and Observation Level:  - Based on my clinical evaluation, I estimate the patient to be at minimal risk of self harm in the current setting. - At this time, we recommend  routine. This decision is based on my review of the chart including patient's history and current presentation, interview of the patient, mental status examination, and consideration of suicide risk including evaluating suicidal ideation, plan, intent,  suicidal or self-harm behaviors, risk factors, and protective factors. This judgment is based on our ability to directly address suicide risk, implement suicide prevention strategies, and develop a safety plan while the patient is in the clinical setting. Please contact our team if there is a concern that risk level has changed.  CSSR  Risk Category:C-SSRS RISK CATEGORY: No Risk  Suicide Risk Assessment: Patient has following modifiable risk factors for suicide: untreated depression, which we are addressing by starting sleep medication and working toward rehab plan. Patient has following non-modifiable //or demographic risk factors for suicide: early widowhood Patient has the following protective factors against suicide: Supportive family and Supportive friends  Thank you for this consult request. Recommendations have been communicated to the primary team.  We will continue following him at this time.   Sharlot Becker, NP       History of Present Illness  Relevant Aspects of [facility] Course:  Admitted on 02/22/2024 for recurrent falls.   Patient Report:  Patient family and staff report worsening symptoms of depression and agitation over the last couple days. He states he does not know what is going on but upon further questioning it seems he is not confused but describing all the new providers and staff coming in and out of his room. He became frustrated when he was trying to call his son and locked himself out of his phone (his phone has a new iOS update). He has currently been in this facility fo 13 days and planned to go to rehab facility today which was postponed due to his confusion at times. He is disoriented at times looking for his wife of 40 years who passed 13 years ago (per son). He is easily reoriented to situation. Nurse reports he was emotional yesterday and crying and after a dose of Haldol  and was less agitated with the ability to rest. She states he did not like watching baseball and likes music so she played some for him. He denies any thoughts of harm to self or others, denies auditory or visual hallucinations. Charles Lloyd did request something to help him sleep as he is struggling to obtain sleep in this new environment.  He is motivated to get to rehab as planned and back home to his routine, medication  ordered after permission from his son obtained.  Caveat:  Charles Lloyd got tearful when he expressed that he was totally himself above the waists but below the waist were tubes and other things.  Encouragement provided that rehab would assist him in regaining his physical strength.    This provider spoke with his son, Charles Lloyd, who was concerned as he dad was clear and coherent until his MRI after he was given Ativan .  His behaviors continued in the ICU with mitts being needed and only stopped when he was changed to phenobarbital  versus Ativan .  Discussed his father has been through many physical stressors in the past 13 days with his routine and environment change that is triggering his disorientation.  It was noted that lower stimulation, like having his door closed, assists with decreasing his anxiety and confusion which this provider witnessed.  Discussed sleep medication of Rozerem  to assist with sleep and not interfere with his QTc interval that is prolonged.  He was agreeable for him to start it, order placed.  03/07/2024: Patient was sleeping soundly. Spoke to his son outside the room. He states he slept all night and has not seen him sleep like this since admission. He would like  to continue the Rozerem  tonight and at the rehab facility. He did get another dose of Haldol  last night. I would recommend holding the IV med due to his extensive cardiac history and high QTC, discontinued. He did well yesterday during my visit with redirection and decreased external stimuli (closed door).   03/08/2024: The client was sleeping again this morning with a different presentation than yesterday when he was calmly sleeping with his mouth closed.  Today, he is asleep with twitching frequently of his limbs and head.  His son is at his bedside and his daughter-in-law came later during the assessment.  The son is distressed with his waxing and waning of symptoms even though he is aware of the delirium process.  It is  difficult for him and his wife to see him in this state as he was an active 84 yo.  Support provided about the processes fluctuations and reminders of the physical stress his body has been through recently with the back surgery and now the abscess surgery.  His son perseverates on feeling he did not deal with his delirium after he read about it and seems to blame himself about his fall.  He and his wife are and have done a phenomenal job caring for him which was reiterated to them.  The fact the fall lead him to the ED where they discovered the abscess could be positive as they were able to treat it prior to it being worse.  The family was reminded of this and the excellent care they are providing.  His daughter is flying in tomorrow and this provider encouraged the son and his wife to take some time when she arrives for self-care as it is apparent they have been very diligent in their care here.  Communicated to them that this provider would return later to see how he is doing.  The plan continues for him to transition to [facility] tomorrow where staff have assured the son they see delirium frequently with improvement as clients are there and physically recovering.    Psych ROS:  Depression: yes Anxiety:  yes Mania (lifetime and current): none Psychosis: (lifetime and current): none  Collateral information:  Contacted from son Charles Lloyd) at 2pm on 03/07/24, see note above.  Review of Systems  All other systems reviewed and are negative.    Psychiatric and Social History  Psychiatric History:  Information collected from Patient, nurse, daughter in law, son.  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, plan to go to rehab  Access to weapons/lethal means: will assess   Substance History Alcohol : 4-5 drinks per day prior to admission  Type of alcohol  liquor  Last Drink 13 days ago prior to admission Number of drinks per day 4-5 History of alcohol  withdrawal seizures none History of DT's none Tobacco: none   Exam Findings  Physical Exam: by MD, reviewed Vital Signs:  Temp:  [99 F (37.2 C)-99.5 F (37.5 C)] 99 F (37.2 C) (01/11 0609) Pulse Rate:  [98-102] 98 (01/11 0609) Resp:  [16] 16 (01/11 0609) BP: (100-104)/(63-68) 100/68 (01/11 0609) SpO2:  [98 %-100 %] 100 % (01/11 0609) Blood pressure 100/68, pulse 98,  temperature 99 F (37.2 C), temperature source Oral, resp. rate 16, height 5' 8 (1.727 m), weight 79.3 kg, SpO2 100%. Body mass index is 26.58 kg/m.  Physical Exam  Mental Status Exam: General Appearance: Casual  Orientation:  Full (Time, Place, and Person)  Memory:  Immediate;   Fair Recent;   Fair Remote;   Fair  Concentration:  Concentration: Fair and Attention Span: Fair  Recall:  Fair  Attention  Fair  Eye Contact:  Good  Speech:  Clear and Coherent  Language:  Good  Volume:  Normal  Mood: Anxious on Friday, 1/9  Affect:  Congruent  Thought Process:  Goal Directed  Thought Content:  Logical  Suicidal Thoughts:  No  Homicidal Thoughts:  No  Judgement:  Good  Insight:  Fair  Psychomotor Activity:  Normal   Akathisia:  No  Fund of Knowledge:  Good      Assets:  Desire for Improvement Financial Resources/Insurance Housing Resilience Social Support Transportation  Cognition: WDL  ADL's:  Intact  AIMS (if indicated):        Other History   These have been pulled in through the EMR, reviewed, and updated if appropriate.  Family History:  The patient's family history includes Cancer in his father and mother; Depression in his father and mother; Early death in his father and  mother.  Medical History: Past Medical History:  Diagnosis Date   Acute tracheobronchitis 01/05/2019   B12 deficiency    Basal cell carcinoma (BCC) of left side of nose 01/28/2018   Cancer (HCC)    Cardiomyopathy (HCC) 03/10/2018   With chronic atrial fibrillation   CHF (congestive heart failure) (HCC) 04/16/2013   Functional class II, ejection fraction 35-40%  Formatting of this note might be different from the original. Functional class II, ejection fraction 35-40%   Chronic anticoagulation    Chronic diastolic (congestive) heart failure (HCC) 04/16/2013   Functional class II, ejection fraction 35-40%  Last Assessment & Plan:  Clinically stable Volume well controlled meds reviewed   Chronic diastolic CHF (congestive heart failure) (HCC) 04/16/2013   Functional class II, ejection fraction 35-40%  Last Assessment & Plan:  Clinically stable Volume well controlled meds reviewed   Colon polyps 04/16/2013   Coronary artery disease involving native coronary artery of native heart without angina pectoris 04/05/2022   Myoview  05/25/2019: EF 60, normal perfusion, low risk CCTA (AF protocol) 04/25/20: CAC score 4242 (99th percentile)   Diabetic neuropathy (HCC)    Difficult intubation    pt reports difficult intubation during surgery in [place] in 2015   Dysrhythmia    A.Fib   Eosinophil count raised 02/17/2019   Essential hypertension 01/06/2010   Last Assessment & Plan:  Well controlled Continue med management   Gout    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  Steroid usage was discontinued several months ago.  Follow up with Dermatology as planned.   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   Last Assessment & Plan:  Id following ?ongoing abx managementy reported Request records   Infection of prosthetic right knee joint    Insomnia 03/04/2012   Grief with loss  of wife 02/20/12  Last Assessment & Plan:  Improved sx  contineu xanax prn Se discussed   Lesion of liver 04/20/2019   -On cardiac CT 02/2019   Mild reactive airways disease    Neoplasm of prostate 04/16/2013   Neoplasm of prostate, 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 Transthoracic Echocardiogram-03/10/2013-[facility]: Normal left ventricular wall thickness and cavity size.  Global left ventricular systolic function is moderately reduced.  The estimated ejection fraction is 35-40%.  The left atrium is moderately enlarged.  The right atrium is mildly enlarged.  No significant valvular    Overweight (BMI 25.0-29.9) 10/22/2016   Persistent atrial fibrillation (HCC) 04/16/2013   Last Assessment & Plan:  Rate controlled Continue med management eliquis  for stroke preventino  Formatting of this note might be different from the original.  Drug  HX Current Rx Pre-ABL inefficacy Pre-ABL intolerant Post-ABL inefficacy Post-ABL intolerant max dose/24h M/Y end comments  sotalol                  dofetilide                  flecainide                   propafenone                  am   S/P TKR (total knee replacement), bilateral    Secondary hypercoagulable state 04/09/2019   Tinea cruris 04/16/2013   Type 2 diabetes mellitus with diabetic polyneuropathy, without long-term current use of insulin  (HCC) 04/16/2013   Last Assessment & Plan:  Labs today  Pt reports well controlled on ambulatory monitoring   Vitamin D  deficiency 07/25/2018    Surgical History: Past Surgical History:  Procedure Laterality Date   ANTERIOR LAT LUMBAR FUSION N/A 02/06/2024   Procedure: ANTERIOR PRONE LATERAL LUMBAR FUSION LUMBAR FOUR-LUMBAR FIVE;  Surgeon: Darnella Dorn SAUNDERS, MD;  Location: [facility] OR;  Service: Neurosurgery;  Laterality: N/A;   ATRIAL FIBRILLATION ABLATION N/A 03/12/2019   Procedure: ATRIAL FIBRILLATION ABLATION;  Surgeon: Inocencio Soyla Lunger, MD;  Location: [facility] INVASIVE CV LAB;  Service: Cardiovascular;  Laterality: N/A;   ATRIAL FIBRILLATION ABLATION N/A 04/29/2020   Procedure: ATRIAL FIBRILLATION ABLATION;  Surgeon: Inocencio Soyla Lunger, MD;  Location: [facility] INVASIVE CV LAB;  Service: Cardiovascular;  Laterality: N/A;   CARDIAC ELECTROPHYSIOLOGY STUDY AND ABLATION     CARDIOVERSION     CARDIOVERSION N/A 10/10/2018   Procedure: CARDIOVERSION;  Surgeon: Delford Maude BROCKS, MD;  Location: [facility] ENDOSCOPY;  Service: Cardiovascular;  Laterality: N/A;   CARDIOVERSION N/A 05/18/2020   Procedure: CARDIOVERSION;  Surgeon: Alveta Aleene PARAS, MD;  Location: [facility] ENDOSCOPY;  Service: Cardiovascular;  Laterality: N/A;   CARDIOVERSION N/A 12/09/2020   Procedure: CARDIOVERSION;  Surgeon: Pietro Redell RAMAN, MD;  Location: [facility] ENDOSCOPY;  Service: Cardiovascular;  Laterality: N/A;   CHOLECYSTECTOMY     PROSTATECTOMY     REPLACEMENT TOTAL KNEE BILATERAL       Medications:  Current Medications[1]  Allergies: Allergies[2]  Sharlot Becker, NP       [1]  Current Facility-Administered Medications:    0.9 %  sodium chloride  infusion, , Intravenous, Continuous, Alekh, Kshitiz,  MD   acetaminophen  (TYLENOL ) tablet 650 mg, 650 mg, Oral, Q6H PRN, 650 mg at 03/06/24 1503 **OR** acetaminophen  (TYLENOL ) suppository 650 mg, 650 mg, Rectal, Q6H PRN, Opyd, Evalene RAMAN, MD   allopurinol  (ZYLOPRIM ) tablet 300 mg, 300 mg, Oral, Daily, Barbarann Nest, MD, 300 mg at 03/07/24 1200    apixaban  (ELIQUIS ) tablet 5 mg, 5 mg, Oral, BID, Sreeram, Narendranath, MD, 5 mg at 03/07/24 2314   atorvastatin  (LIPITOR) tablet 20 mg, 20 mg, Oral, QHS, Crozier, Peyton, PA-C, 20 mg at 03/07/24 2315   bisacodyl  (DULCOLAX) EC tablet 5 mg, 5 mg, Oral, Daily PRN, Opyd, Timothy S, MD   carvedilol  (COREG ) tablet 3.125 mg, 3.125 mg, Oral, BID, Crozier, Peyton, PA-C, 3.125 mg at 03/07/24 2315   ceFEPIme  (MAXIPIME ) 2 g in sodium chloride  0.9 % 100 mL IVPB, 2 g, Intravenous, Q12H, Shade, Wanda BRAVO, RPH, Last Rate: 200 mL/hr at 03/08/24 0614, 2 g at 03/08/24 9385   Chlorhexidine  Gluconate Cloth 2 % PADS 6 each, 6 each, Topical, Daily, Barbarann Nest, MD, 6 each at 03/07/24 1700   cyclobenzaprine  (FLEXERIL ) tablet 10 mg, 10 mg, Oral, TID PRN, Crozier, Peyton, PA-C, 10 mg at 03/06/24 1503   cycloSPORINE  (RESTASIS ) 0.05 % ophthalmic emulsion 1 drop, 1 drop, Both Eyes, Daily, Opyd, Evalene RAMAN, MD, 1 drop at 03/07/24 1200   DAPTOmycin  (CUBICIN ) 600 mg in sodium chloride  0.9 % IVPB, 8 mg/kg, Intravenous, Q1400, Vu, Trung T, MD, Last Rate: 124 mL/hr at 03/07/24 1456, 600 mg at 03/07/24 1456   flecainide  (TAMBOCOR ) tablet 100 mg, 100 mg, Oral, BID, Crozier, Peyton, PA-C, 100 mg at 03/07/24 2315   folic acid  (FOLVITE ) tablet 1 mg, 1 mg, Oral, Daily, Crozier, Peyton, PA-C, 1 mg at 03/07/24 1200   gabapentin  (NEURONTIN ) capsule 300 mg, 300 mg, Oral, Daily, Crozier, Peyton, PA-C, 300 mg at 03/07/24 1200   gabapentin  (NEURONTIN ) capsule 600 mg, 600 mg, Oral, QPM, Crozier, Peyton, PA-C, 600 mg at 03/07/24 2314   hydrALAZINE  (APRESOLINE ) tablet 25 mg, 25 mg, Oral, BID, Crozier, Peyton, PA-C, 25 mg at 03/07/24 2315   hydrocortisone  cream 1 %, , Topical, TID, Jillian Buttery, MD, Given at 03/07/24 2317   insulin  aspart (novoLOG ) injection 0-6 Units, 0-6 Units, Subcutaneous, TID AC & HS, Barbarann Nest, MD, 3 Units at 03/07/24 2313   liver oil-zinc  oxide (DESITIN) 40 % ointment, , Topical, Daily PRN, Cheryle, Kshitiz, MD    montelukast  (SINGULAIR ) tablet 10 mg, 10 mg, Oral, QHS, Crozier, Peyton, PA-C, 10 mg at 03/07/24 2315   Oral care mouth rinse, 15 mL, Mouth Rinse, PRN, Barbarann Nest, MD   oxybutynin  (DITROPAN ) tablet 5 mg, 5 mg, Oral, Q8H PRN, Cheryle, Kshitiz, MD, 5 mg at 03/06/24 2134   ramelteon  (ROZEREM ) tablet 8 mg, 8 mg, Oral, QHS, Charina Fons Y, NP, 8 mg at 03/07/24 2315   senna-docusate (Senokot-S) tablet 1 tablet, 1 tablet, Oral, QHS PRN, Opyd, Evalene RAMAN, MD   sodium chloride  flush (NS) 0.9 % injection 10-40 mL, 10-40 mL, Intracatheter, PRN, Opyd, Timothy S, MD   sodium chloride  flush (NS) 0.9 % injection 10-40 mL, 10-40 mL, Intracatheter, PRN, Barbarann Nest, MD   sodium chloride  flush (NS) 0.9 % injection 3 mL, 3 mL, Intravenous, Q12H, Opyd, Timothy S, MD, 3 mL at 03/07/24 2316   sodium chloride  tablet 1 g, 1 g, Oral, TID WC, Alekh, Kshitiz, MD, 1 g at 03/07/24 1807   tamsulosin  (FLOMAX ) capsule 0.4 mg, 0.4 mg, Oral, QPC supper, Barbarann Nest, MD, 0.4 mg at 03/07/24  1807   thiamine  (VITAMIN B1) tablet 100 mg, 100 mg, Oral, Daily, 100 mg at 03/07/24 1200 **OR** [DISCONTINUED] thiamine  (VITAMIN B1) injection 100 mg, 100 mg, Intravenous, Daily, Adhikari, Amrit, MD, 100 mg at 02/25/24 0935 [2]  Allergies Allergen Reactions   Amlodipine  Nausea Only, Swelling and Other (See Comments)    Severe edema and chest pain- also   Ativan  [Lorazepam ] Other (See Comments)    Made the patient fidgety and he hallucinates   Chlorhexidine  Gluconate Itching and Other (See Comments)    Only itching with wipes, not with soap

## 2024-03-08 NOTE — Consult Note (Signed)
 Revisited the client who was not awake yet.  He was more agitated than twitching like this morning.  It appears he is experiencing a paradoxical reaction to the Rozerem  like he did with Ativan .  Rozerem  discontinued.  The family continues to struggle with is fluctuations in cognitions.  RN and this provider continue to be encouraging and supportive.  The team and family hopes he awakens and can transition tomorrow to [employer].  Sharlot Becker, PMHNP

## 2024-03-08 NOTE — Progress Notes (Signed)
 " PROGRESS NOTE    Charles Lloyd Juanito Mickey.  FMW:[id number] DOB: [birth year] DOA: 02/22/2024 PCP: Theophilus Andrews, Tully GRADE, MD   Brief Narrative:  [phone] with h/o HTN, HLD, T2DM, afib on Eliquis , and L4-5 spondylolisthesis s/p fusion on 12/11 who presented with LLE weakness and pain with falls. He was found to have a 3.5 x 2.4 cm L psoas muscle abscess. Neurosurgery recommended IR drainage and IV antibiotics.  Underwent CT-guided aspiration on 02/24/24. Developed hospital-associated delirium requiring ICU transfer, now improved.  Currently on daptomycin  and cefepime  till 04/13/2024 as per ID recommendations.  Psychiatry consulted on 03/06/2024 for intermittent agitation and as per family request.  Palliative care also consulted for goals of care discussion.  Assessment & Plan:   Left psoas abscess -Had recent transverse lumbar interbody fusion on 02/06/2024. -S/p IR CT guided aspiration of left psoas fluid collection, drain placed.  No growth on fluid and blood cultures - Currently on daptomycin  and cefepime  till 04/13/2024 as per ID recommendations.  Currently has a PICC line. -Awaiting placement.  TOC following.  Denied by insurance for CIR placement.  Delirium Alcohol  withdrawal syndrome with delirium Possible underlying dementia -MRI brain unremarkable -Off precedex  drip.  Completed phenobarbital  taper - Continue delirium precautions and frequent reorientation. - Avoid Ativan : Son was concerned about paradoxical response to Ativan .  Currently added as an allergy -Psychiatry consulted on 03/06/2024 for intermittent agitation and as per family request.  Patient was started on Rozerem  by psychiatry.  Patient has been very sleepy with intermittent tremors since yesterday morning with poor low oral intake.  Family concerned about the same.  Will follow further psychiatry recommendations  Paroxysmal A-fib - Currently rate controlled.  Continue flecainide , Coreg  and Eliquis   Chronic diastolic heart  failure Hypertension hyperlipidemia - Compensated.  Continue strict input and output.  Daily weights.  Continue hydralazine , Coreg .  Torsemide  resumed on 03/04/2024.  But held on 03/07/2024 because of increasing creatinine.  Outpatient follow-up with cardiology - Continue statin  AKI - Possibly from poor oral intake recently.  Creatinine slightly worse today.  Start IV fluids.  Monitor.  Diuretic plan as above.  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  Blood sugars currently stable.  Continue CBGs with SSI.  Will not continue insulin  at discharge.  Continue gabapentin   Asthma - No exacerbation.  Continue montelukast   Recurrent falls -Awaiting rehab placement  Wound 02/24/24 1245 Pressure Injury Buttocks Mid;Lower Deep Tissue Pressure Injury - Purple or maroon localized area of discolored intact skin or blood-filled blister due to damage of underlying soft tissue from pressure and/or shear. (Active)     Wound 02/24/24 1245 Pressure Injury Scrotum Circumferential Deep Tissue Pressure Injury - Purple or maroon localized area of discolored intact skin or blood-filled blister due to damage of underlying soft tissue from pressure and/or shear. (Active)  Present to admission: Continue local wound care  Acute on chronic urinary retention - Failed voiding trial on 03/04/2024 and Foley catheter had to be replaced on 03/04/2024.  Continue Flomax .  Outpatient follow-up with urology  Hyponatremia - Sodium improving to 133 today.  Diuretic plan as above.  Continue salt tablets. Monitor  Hypokalemia - Improved  Hypomagnesemia - Improved  Anemia of chronic disease - From chronic illnesses.  Hemoglobin stable.  Monitor intermittently  Thrombocytopenia -Questionable cause.  No signs of bleeding.  Monitor  Goals of care - Overall prognosis is guarded to poor.  Palliative care evaluation appreciated.  Patient remains full code.  DVT prophylaxis: Eliquis  Code  Status: Full Family Communication: Son and daughter-in-law at bedside  Disposition Plan: Status is: Inpatient Remains inpatient appropriate because: Of severity of illness.  Need for SNF placement    Consultants: Neurosurgery/IR/PCCM/ID.  Psychiatry.  palliative care  Procedures: As above  Antimicrobials:  Anti-infectives (From admission, onward)    Start     Dose/Rate Route Frequency Ordered Stop   03/07/24 1800  ceFEPIme  (MAXIPIME ) 2 g in sodium chloride  0.9 % 100 mL IVPB        2 g 200 mL/hr over 30 Minutes Intravenous Every 12 hours 03/07/24 1118     02/28/24 2200  ceFEPIme  (MAXIPIME ) 2 g in sodium chloride  0.9 % 100 mL IVPB  Status:  Discontinued        2 g 200 mL/hr over 30 Minutes Intravenous Every 8 hours 02/28/24 1440 03/07/24 1118   02/28/24 1530  DAPTOmycin  (CUBICIN ) 600 mg in sodium chloride  0.9 % IVPB        8 mg/kg  79.3 kg 124 mL/hr over 30 Minutes Intravenous Daily 02/28/24 1440     02/28/24 0000  daptomycin  (CUBICIN ) IVPB        600 mg Intravenous Every 24 hours 02/28/24 1444 04/10/24 2359   02/28/24 0000  ceFEPime  (MAXIPIME ) IVPB        2 g Intravenous Every 8 hours 02/28/24 1444 04/10/24 2359   02/23/24 2200  vancomycin  (VANCOREADY) IVPB 1500 mg/300 mL  Status:  Discontinued        1,500 mg 150 mL/hr over 120 Minutes Intravenous Every 24 hours 02/22/24 2348 02/23/24 0242   02/23/24 1000  linezolid  (ZYVOX ) IVPB 600 mg  Status:  Discontinued       Note to Pharmacy: Pt developed rash shortly after vancomycin  administration.   600 mg 300 mL/hr over 60 Minutes Intravenous Every 12 hours 02/23/24 0242 02/28/24 1440   02/23/24 0800  piperacillin -tazobactam (ZOSYN ) IVPB 3.375 g  Status:  Discontinued        3.375 g 12.5 mL/hr over 240 Minutes Intravenous Every 8 hours 02/22/24 2348 02/28/24 1440   02/22/24 2315  piperacillin -tazobactam (ZOSYN ) IVPB 3.375 g        3.375 g 100 mL/hr over 30 Minutes Intravenous  Once 02/22/24 2306 02/23/24 0013   02/22/24 2315   vancomycin  (VANCOREADY) IVPB 1500 mg/300 mL        1,500 mg 150 mL/hr over 120 Minutes Intravenous  Once 02/22/24 2306 02/23/24 0141        Subjective: Patient seen and examined at bedside.  Poor historian.  No agitation, fever or seizures reported.   Objective: Vitals:   03/06/24 2025 03/07/24 0610 03/07/24 2222 03/08/24 0609  BP: (!) 140/73 128/62 104/63 100/68  Pulse: 81 91 (!) 102 98  Resp: 16 16 16 16   Temp: (!) 97.4 F (36.3 C) 98 F (36.7 C) 99.5 F (37.5 C) 99 F (37.2 C)  TempSrc: Oral Oral Oral Oral  SpO2: 100% 99% 98% 100%  Weight:      Height:        Intake/Output Summary (Last 24 hours) at 03/08/2024 0822 Last data filed at 03/08/2024 0654 Gross per 24 hour  Intake 220 ml  Output 2100 ml  Net -1880 ml   Filed Weights   02/22/24 1238 02/24/24 1245  Weight: 79.4 kg 79.3 kg    Examination:  General: No acute distress.  Remains on room air.  Elderly male lying in bed. ENT/neck: No obvious JVD elevation or palpable neck mass  noted respiratory: Decreased breath sounds at bases with scattered crackles  CVS: Intermittently mildly tachycardic; S1 and S2 heard abdominal: Soft, nontender, mildly distended; no organomegaly, bowel sounds are normally heard extremities: No clubbing; mild lower extremity edema present  CNS: Sleepy, wakes up only very slightly, extremity slow to respond.  Poor historian.  Having intermittent upper extremity tremors  lymph: No palpable lymphadenopathy  skin: No obvious rashes/petechiae psych: Affect is flat.  Not agitated currently genitourinary: Has a Foley catheter  musculoskeletal: No obvious joint tenderness/erythema    Data Reviewed: I have personally reviewed following labs and imaging studies  CBC: Recent Labs  Lab 03/02/24 1059 03/03/24 0950 03/04/24 0408 03/05/24 0306 03/07/24 0350  WBC 4.8 4.7 4.3 4.4 6.1  NEUTROABS  --   --   --  2.3 3.4  HGB 9.7* 9.2* 8.1* 9.2* 9.4*  HCT 28.8* 26.8* 24.5* 26.7* 27.6*  MCV  88.1 86.7 88.1 85.9 86.8  PLT 104* 90* 80* 95* 129*   Basic Metabolic Panel: Recent Labs  Lab 03/04/24 0408 03/05/24 0306 03/06/24 0434 03/07/24 0350 03/08/24 0304  NA 125* 126* 130* 131* 133*  K 3.3* 3.3* 3.2* 3.4* 3.6  CL 96* 94* 97* 95* 97*  CO2 21* 23 24 24 23   GLUCOSE 90 108* 105* 114* 109*  BUN 10 11 12 14 19   CREATININE 0.75 0.90 1.20 1.31* 1.58*  CALCIUM  8.4* 9.3 9.2 9.8 9.6  MG  --  1.6* 1.7 1.8 1.9   GFR: Estimated Creatinine Clearance: 35.5 mL/min (A) (by C-G formula based on SCr of 1.58 mg/dL (H)). Liver Function Tests: No results for input(s): AST, ALT, ALKPHOS, BILITOT, PROT, ALBUMIN  in the last 168 hours. No results for input(s): LIPASE, AMYLASE in the last 168 hours. No results for input(s): AMMONIA in the last 168 hours. Coagulation Profile: No results for input(s): INR, PROTIME in the last 168 hours. Cardiac Enzymes: No results for input(s): CKTOTAL, CKMB, CKMBINDEX, TROPONINI in the last 168 hours.  BNP (last 3 results) No results for input(s): PROBNP in the last 8760 hours. HbA1C: No results for input(s): HGBA1C in the last 72 hours. CBG: Recent Labs  Lab 03/07/24 0803 03/07/24 1129 03/07/24 1556 03/07/24 2215 03/08/24 0728  GLUCAP 117* 105* 163* 290* 109*   Lipid Profile: No results for input(s): CHOL, HDL, LDLCALC, TRIG, CHOLHDL, LDLDIRECT in the last 72 hours. Thyroid  Function Tests: No results for input(s): TSH, T4TOTAL, FREET4, T3FREE, THYROIDAB in the last 72 hours. Anemia Panel: No results for input(s): VITAMINB12, FOLATE, FERRITIN, TIBC, IRON, RETICCTPCT in the last 72 hours. Sepsis Labs: No results for input(s): PROCALCITON, LATICACIDVEN in the last 168 hours.  No results found for this or any previous visit (from the past 240 hours).        Radiology Studies: No results found.      Scheduled Meds:  allopurinol   300 mg Oral Daily   apixaban   5 mg  Oral BID   atorvastatin   20 mg Oral QHS   carvedilol   3.125 mg Oral BID   Chlorhexidine  Gluconate Cloth  6 each Topical Daily   cycloSPORINE   1 drop Both Eyes Daily   flecainide   100 mg Oral BID   folic acid   1 mg Oral Daily   gabapentin   300 mg Oral Daily   gabapentin   600 mg Oral QPM   hydrALAZINE   25 mg Oral BID   hydrocortisone  cream   Topical TID   insulin  aspart  0-6 Units Subcutaneous TID AC & HS  montelukast   10 mg Oral QHS   ramelteon   8 mg Oral QHS   sodium chloride  flush  3 mL Intravenous Q12H   sodium chloride   1 g Oral TID WC   tamsulosin   0.4 mg Oral QPC supper   thiamine   100 mg Oral Daily   Continuous Infusions:  ceFEPime  (MAXIPIME ) IV 2 g (03/08/24 9385)   DAPTOmycin  600 mg (03/07/24 1456)          Sophie Mao, MD [facility] 03/08/2024, 8:22 AM   "

## 2024-03-08 NOTE — Progress Notes (Addendum)
 "                                                                                                                                                                                                          Daily Progress Note   Patient Name: Charles Lloyd.       Date: 03/08/2024 DOB: 07/09/1940  Age: 84 y.o. MRN#: [id number] Attending Physician: Cheryle Page, MD Primary Care Physician: Theophilus Andrews, Tully GRADE, MD Admit Date: 02/22/2024  Reason for Consultation/Follow-up: Establishing goals of care   Length of Stay: 15  Current Medications: Scheduled Meds:   allopurinol   300 mg Oral Daily   apixaban   5 mg Oral BID   atorvastatin   20 mg Oral QHS   carvedilol   3.125 mg Oral BID   Chlorhexidine  Gluconate Cloth  6 each Topical Daily   cycloSPORINE   1 drop Both Eyes Daily   flecainide   100 mg Oral BID   folic acid   1 mg Oral Daily   gabapentin   300 mg Oral Daily   gabapentin   600 mg Oral QPM   hydrALAZINE   25 mg Oral BID   hydrocortisone  cream   Topical TID   insulin  aspart  0-6 Units Subcutaneous TID AC & HS   montelukast   10 mg Oral QHS   ramelteon   8 mg Oral QHS   sodium chloride  flush  3 mL Intravenous Q12H   sodium chloride   1 g Oral TID WC   tamsulosin   0.4 mg Oral QPC supper   thiamine   100 mg Oral Daily    Continuous Infusions:  sodium chloride  75 mL/hr at 03/08/24 1017   ceFEPime  (MAXIPIME ) IV 2 g (03/08/24 0614)   DAPTOmycin  600 mg (03/07/24 1456)    PRN Meds: acetaminophen  **OR** acetaminophen , bisacodyl , cyclobenzaprine , liver oil-zinc  oxide, mouth rinse, oxybutynin , senna-docusate, sodium chloride  flush, sodium chloride  flush  Physical Exam Vitals reviewed.  Constitutional:      General: He is sleeping.  Cardiovascular:     Rate and Rhythm: Normal rate.  Pulmonary:     Effort: Pulmonary effort is normal.  Skin:    General: Skin is dry.  Neurological:     Motor: Tremor present.     Comments: Upper extremity             Vital Signs: BP 100/68  (BP Location: Right Arm)   Pulse 98   Temp 99 F (37.2 C) (Oral)   Resp 16   Ht 5' 8 (1.727 m)   Wt 79.3 kg   SpO2 100%   BMI  26.58 kg/m  SpO2: SpO2: 100 % O2 Device: O2 Device: Room Air O2 Flow Rate: O2 Flow Rate (L/min): 3 L/min    Patient Active Problem List   Diagnosis Date Noted   Stress reaction causing mixed disturbance of emotion and conduct 03/06/2024   Acute on chronic urinary retention 02/29/2024   Pressure injury 02/27/2024   Altered mental status 02/24/2024   Alcohol  withdrawal syndrome, with delirium (HCC) 02/24/2024   Delirium 02/24/2024   Psoas abscess, left (HCC) 02/22/2024   Elective surgery 02/06/2024   Bilateral leg pain 11/29/2023   Acute posthemorrhagic anemia 06/21/2022   Bilateral lower extremity edema 04/06/2022   Coronary artery disease involving native coronary artery of native heart without angina pectoris 04/05/2022   Gout 02/27/2022   Acute renal failure superimposed on stage 3b chronic kidney disease (HCC) 11/16/2021   Uremia 11/16/2021   Gout due to renal impairment 10/17/2021   Pancytopenia (HCC) 08/05/2021   SIRS (systemic inflammatory response syndrome) (HCC) 08/04/2021   Chronic kidney disease, stage 3b (HCC) 12/30/2020   Recurrent falls    Spinal stenosis    S/P TKR (total knee replacement), bilateral    Arthralgia 11/20/2019   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   Lesion of liver 04/20/2019   Secondary hypercoagulable state 04/09/2019   Eosinophil count raised 02/17/2019   Acute tracheobronchitis 01/05/2019   Asthma    Hyponatremia 12/17/2018   Vitamin D  deficiency 07/25/2018   Chronic anticoagulation 03/06/2018   Hyperlipidemia associated with type 2 diabetes mellitus (HCC) 01/28/2018   Basal cell carcinoma (BCC) of left side of nose 01/28/2018   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   Artificial knee joint present 05/20/2017   Left knee pain 05/07/2017   B12 deficiency 10/22/2016   Overweight (BMI 25.0-29.9) 10/22/2016    Primary osteoarthritis of right knee 03/29/2015   Osteoarthritis of right knee 03/29/2015   History of cholecystectomy 11/20/2014   Occult blood in stools 02/09/2014   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   Paroxysmal A-fib (HCC) 04/16/2013   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 male  with past medical history of HTN, HLD, T2DM, atrial fibrillation on Eliquis , CHF, CAD, gout, and  s/p recent L4-5 trans psoas lumbar interbody fusion left-sided approach on 02/06/2024 admitted on 02/22/2024 with leg pain, weakness, and falls. He was found to have a 3.5 x 2.4 cm L psoas muscle abscess.    Neurosurgery recommended IR drainage and antibiotics. Underwent CT-guided aspiration on 02/24/24. Developed hospital-associated delirium requiring ICU transfer, now improved.  Currently on daptomycin  and cefepime  till 04/13/2024 as per ID recommendations.  Psychiatry consulted on 03/06/2024 for intermittent agitation and as per family request.    Patient is awaiting placement for rehab- [facility].   Today's Discussion: Reviewed chart including latest notes from attending provider and psychiatry. Received update from nursing. Patient slept most of yesterday. He was unable to have much conversation with his family or nursing. His son is concerned about his waxing and waning symptoms. Plan  remains for patient to discharge to  rehab.  12:20: Left voicemail for patient's son with call back information. Encouraged son to call with any questions or concerns. PMT will continue to follow.   12:30: Received callback from patient's son.  He shared his concern and disappointment in the patient's lethargy.  We discussed potential causes.  He plans to visit the patient later this afternoon.  Encouraged him to reach out to PMT if he would like to continue discussions.  Recommendations/Plan: Full code Full scope GOC discussion once patient is able Son will look for ACP documents Continued PMT support   Code Status:    Code Status Orders  (From admission, onward)           Start     Ordered   02/23/24 0010  Full code  Continuous       Question:  By:  Answer:  Consent: discussion documented in EHR   02/23/24 0011         Extensive chart review has been completed prior to seeing the patient including labs, vital signs, progress/consult notes, orders, medications, and available advance directive documents.  Care plan was discussed with bedside RN  Time spent: 25 minutes  Thank you for allowing the Palliative Medicine Team to assist in the care of this patient.   Stephane CHRISTELLA Palin, NP  Please contact Palliative Medicine Team phone at [phone] for questions and concerns.       "

## 2024-03-09 ENCOUNTER — Inpatient Hospital Stay (HOSPITAL_COMMUNITY)

## 2024-03-09 DIAGNOSIS — Z515 Encounter for palliative care: Secondary | ICD-10-CM | POA: Diagnosis not present

## 2024-03-09 DIAGNOSIS — K6812 Psoas muscle abscess: Secondary | ICD-10-CM | POA: Diagnosis not present

## 2024-03-09 DIAGNOSIS — Z7189 Other specified counseling: Secondary | ICD-10-CM | POA: Diagnosis not present

## 2024-03-09 LAB — COMPREHENSIVE METABOLIC PANEL WITH GFR
ALT: 13 U/L (ref 0–44)
AST: 36 U/L (ref 15–41)
Albumin: 2.8 g/dL — ABNORMAL LOW (ref 3.5–5.0)
Alkaline Phosphatase: 103 U/L (ref 38–126)
Anion gap: 15 (ref 5–15)
BUN: 32 mg/dL — ABNORMAL HIGH (ref 8–23)
CO2: 20 mmol/L — ABNORMAL LOW (ref 22–32)
Calcium: 8.7 mg/dL — ABNORMAL LOW (ref 8.9–10.3)
Chloride: 102 mmol/L (ref 98–111)
Creatinine, Ser: 2.33 mg/dL — ABNORMAL HIGH (ref 0.61–1.24)
GFR, Estimated: 27 mL/min — ABNORMAL LOW
Glucose, Bld: 125 mg/dL — ABNORMAL HIGH (ref 70–99)
Potassium: 3.6 mmol/L (ref 3.5–5.1)
Sodium: 137 mmol/L (ref 135–145)
Total Bilirubin: 0.8 mg/dL (ref 0.0–1.2)
Total Protein: 5.7 g/dL — ABNORMAL LOW (ref 6.5–8.1)

## 2024-03-09 LAB — GLUCOSE, CAPILLARY
Glucose-Capillary: 119 mg/dL — ABNORMAL HIGH (ref 70–99)
Glucose-Capillary: 121 mg/dL — ABNORMAL HIGH (ref 70–99)
Glucose-Capillary: 125 mg/dL — ABNORMAL HIGH (ref 70–99)
Glucose-Capillary: 137 mg/dL — ABNORMAL HIGH (ref 70–99)
Glucose-Capillary: 140 mg/dL — ABNORMAL HIGH (ref 70–99)

## 2024-03-09 LAB — MAGNESIUM: Magnesium: 1.8 mg/dL (ref 1.7–2.4)

## 2024-03-09 LAB — CK: Total CK: 190 U/L (ref 49–397)

## 2024-03-09 MED ORDER — APIXABAN 2.5 MG PO TABS
2.5000 mg | ORAL_TABLET | Freq: Two times a day (BID) | ORAL | Status: DC
  Administered 2024-03-10 – 2024-03-19 (×15): 2.5 mg via ORAL
  Filled 2024-03-09 (×16): qty 1

## 2024-03-09 MED ORDER — FLECAINIDE ACETATE 50 MG PO TABS
50.0000 mg | ORAL_TABLET | Freq: Two times a day (BID) | ORAL | Status: DC
  Administered 2024-03-10 – 2024-03-18 (×14): 50 mg via ORAL
  Filled 2024-03-09 (×21): qty 1

## 2024-03-09 MED ORDER — ALLOPURINOL 100 MG PO TABS
100.0000 mg | ORAL_TABLET | Freq: Every day | ORAL | Status: DC
  Administered 2024-03-10 – 2024-03-19 (×9): 100 mg via ORAL
  Filled 2024-03-09 (×9): qty 1

## 2024-03-09 MED ORDER — CARMEX CLASSIC LIP BALM EX OINT
1.0000 | TOPICAL_OINTMENT | CUTANEOUS | Status: DC | PRN
  Administered 2024-03-17 – 2024-03-23 (×2): 1 via TOPICAL
  Filled 2024-03-09 (×2): qty 10

## 2024-03-09 MED ORDER — SODIUM CHLORIDE 0.9 % IV SOLN
8.0000 mg/kg | INTRAVENOUS | Status: DC
  Administered 2024-03-10 – 2024-03-12 (×2): 600 mg via INTRAVENOUS
  Filled 2024-03-09 (×2): qty 12

## 2024-03-09 MED ORDER — SODIUM CHLORIDE 0.9 % IV SOLN
2.0000 g | INTRAVENOUS | Status: DC
  Administered 2024-03-10: 2 g via INTRAVENOUS
  Filled 2024-03-09: qty 12.5

## 2024-03-09 NOTE — Progress Notes (Signed)
 "                                                                                                                                                                                                          Daily Progress Note   Patient Name: Charles Lloyd.       Date: 03/09/2024 DOB: 12/04/1940  Age: 84 y.o. MRN#: [id number] Attending Physician: Cheryle Page, MD Primary Care Physician: Theophilus Andrews, Tully GRADE, MD Admit Date: 02/22/2024  Reason for Consultation/Follow-up: Establishing goals of care   Length of Stay: 16  Current Medications: Scheduled Meds:   [START ON 03/10/2024] allopurinol   100 mg Oral Daily   apixaban   2.5 mg Oral BID   atorvastatin   20 mg Oral QHS   carvedilol   3.125 mg Oral BID   Chlorhexidine  Gluconate Cloth  6 each Topical Daily   cycloSPORINE   1 drop Both Eyes Daily   flecainide   50 mg Oral BID   folic acid   1 mg Oral Daily   hydrALAZINE   25 mg Oral BID   hydrocortisone  cream   Topical TID   insulin  aspart  0-6 Units Subcutaneous TID AC & HS   montelukast   10 mg Oral QHS   sodium chloride  flush  3 mL Intravenous Q12H   sodium chloride   1 g Oral TID WC   tamsulosin   0.4 mg Oral QPC supper   thiamine   100 mg Oral Daily    Continuous Infusions:  sodium chloride  125 mL/hr at 03/09/24 1400   [START ON 03/10/2024] ceFEPime  (MAXIPIME ) IV     [START ON 03/10/2024] DAPTOmycin       PRN Meds: acetaminophen  **OR** acetaminophen , bisacodyl , cyclobenzaprine , lip balm, liver oil-zinc  oxide, mouth rinse, oxybutynin , senna-docusate, sodium chloride  flush, sodium chloride  flush  Physical Exam Vitals reviewed.  Constitutional:      General: He is sleeping.  Cardiovascular:     Rate and Rhythm: Normal rate.  Pulmonary:     Effort: Pulmonary effort is normal.  Skin:    General: Skin is dry.  Neurological:     Mental Status: He is lethargic.             Vital Signs: BP 135/67 (BP Location: Left Arm)   Pulse 90   Temp 98.5 F (36.9 C) (Oral)   Resp 18    Ht 5' 8 (1.727 m)   Wt 79.3 kg   SpO2 94%   BMI 26.58 kg/m  SpO2: SpO2: 94 % O2 Device: O2 Device: Room Air O2 Flow Rate: O2 Flow Rate (L/min): 3 L/min  Patient Active Problem List   Diagnosis Date Noted   Stress reaction causing mixed disturbance of emotion and conduct 03/06/2024   Acute on chronic urinary retention 02/29/2024   Pressure injury 02/27/2024   Altered mental status 02/24/2024   Alcohol  withdrawal syndrome, with delirium (HCC) 02/24/2024   Delirium 02/24/2024   Psoas abscess, left (HCC) 02/22/2024   Elective surgery 02/06/2024   Bilateral leg pain 11/29/2023   Acute posthemorrhagic anemia 06/21/2022   Bilateral lower extremity edema 04/06/2022   Coronary artery disease involving native coronary artery of native heart without angina pectoris 04/05/2022   Gout 02/27/2022   Acute renal failure superimposed on stage 3b chronic kidney disease (HCC) 11/16/2021   Uremia 11/16/2021   Gout due to renal impairment 10/17/2021   Pancytopenia (HCC) 08/05/2021   SIRS (systemic inflammatory response syndrome) (HCC) 08/04/2021   Chronic kidney disease, stage 3b (HCC) 12/30/2020   Recurrent falls    Spinal stenosis    S/P TKR (total knee replacement), bilateral    Arthralgia 11/20/2019   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   Lesion of liver 04/20/2019   Secondary hypercoagulable state 04/09/2019   Eosinophil count raised 02/17/2019   Acute tracheobronchitis 01/05/2019   Asthma    Hyponatremia 12/17/2018   Vitamin D  deficiency 07/25/2018   Chronic anticoagulation 03/06/2018   Hyperlipidemia associated with type 2 diabetes mellitus (HCC) 01/28/2018   Basal cell carcinoma (BCC) of left side of nose 01/28/2018   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   Artificial knee joint present 05/20/2017   Left knee pain 05/07/2017   B12 deficiency 10/22/2016   Overweight (BMI 25.0-29.9) 10/22/2016   Primary osteoarthritis of right knee 03/29/2015   Osteoarthritis of right knee  03/29/2015   History of cholecystectomy 11/20/2014   Occult blood in stools 02/09/2014   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   Paroxysmal A-fib (HCC) 04/16/2013   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 male  with past medical history of HTN, HLD, T2DM, atrial fibrillation on Eliquis , CHF, CAD, gout, and  s/p recent L4-5 trans psoas lumbar interbody fusion left-sided approach on 02/06/2024 admitted on 02/22/2024 with leg pain, weakness, and falls. He was found to have a 3.5 x 2.4 cm L psoas muscle abscess.    Neurosurgery recommended IR drainage and antibiotics. Underwent CT-guided aspiration on 02/24/24. Developed hospital-associated delirium requiring ICU transfer, now improved.  Currently on daptomycin  and cefepime  till 04/13/2024 as per ID recommendations.  Psychiatry consulted on 03/06/2024 for intermittent agitation and as per family request.    Patient is awaiting placement for rehab- [facility].   Today's Discussion: Reviewed chart and received update from nursing.  Patient remains very lethargic.  Patient's son is very concerned about continued lethargy.  Patient lying in bed sleeping. He does not easily awaken when I enter the room so I let him sleep.  He appears comfortable and is in no distress.  No family at bedside.   4:40: Spoke with patient's son Charles Lloyd.  Nader is  concerned with patient's  continued lethargy.  Patient's son is glad fluids were started as the patient has not eaten or drink anything in a couple days.  Patient's son wishes he knew exactly why patient is lethargic.  We discussed possibilities including delirium, time it might take for the Rozevem to leave his system, and his prolonged hospitalization.  Patient's son is hopeful patient will have improvement and he wants to allow time for outcomes.  Patient's son would like to set up a goals of care meeting to further discuss goals of care now that his sister is in town.  He is considering making a change to CODE STATUS and would like to talk further about options moving forward.  I told him I am off service the rest of the week but will have a colleague check in to set this up.  Emotional support and therapeutic listening provided.  Encouraged patient's family to call PMT with questions or concerns.  PMT will continue to follow  Recommendations/Plan: Full code Full scope Family would like to set up a GOC meeting now that patient's daughter is also in town Continued PMT support   Code Status:    Code Status Orders  (From admission, onward)           Start     Ordered   02/23/24 0010  Full code  Continuous       Question:  By:  Answer:  Consent: discussion documented in EHR   02/23/24 0011         Extensive chart review has been completed prior to seeing the patient including labs, vital signs, progress/consult notes, orders, medications, and available advance directive documents.  Care plan was discussed with bedside RN  Time spent: 35 minutes  Thank you for allowing the Palliative Medicine Team to assist in the care of this patient.   Stephane CHRISTELLA Palin, NP  Please contact Palliative Medicine Team phone at [phone] for questions and concerns.       "

## 2024-03-09 NOTE — TOC Progression Note (Signed)
 Transition of Care (TOC) - Progression Note    Patient Details  Name: Charles Lloyd. MRN: [id number] Date of Birth: Apr 26, 1940  Transition of [facility]) CM/SW Contact  Soniyah Mcglory, Nathanel, RN Phone Number: 03/09/2024, 11:53 AM  Clinical Narrative:  Not medically stable today-auth ends today for [employer] rep Robbie-will need new auth if no d/c tomorrow.     Expected Discharge Plan: Skilled Nursing Facility Barriers to Discharge: Continued Medical Work up               Expected Discharge Plan and Services In-house Referral: NA Discharge Planning Services: CM Consult Post Acute Care Choice: Durable Medical Equipment, IP Rehab Living arrangements for the past 2 months: Single Family Home Expected Discharge Date: 03/01/24                                     Social Drivers of Health (SDOH) Interventions SDOH Screenings   Food Insecurity: No Food Insecurity (02/23/2024)  Housing: Low Risk (02/23/2024)  Transportation Needs: No Transportation Needs (02/23/2024)  Utilities: Not At Risk (02/23/2024)  Alcohol  Screen: Low Risk (12/21/2022)  Depression (PHQ2-9): Low Risk (02/11/2024)  Financial Resource Strain: Low Risk (12/21/2022)  Physical Activity: Inactive (12/21/2022)  Social Connections: Socially Isolated (02/23/2024)  Stress: No Stress Concern Present (12/21/2022)  Tobacco Use: Low Risk (02/22/2024)  Health Literacy: Adequate Health Literacy (12/21/2022)    Readmission Risk Interventions    02/26/2024    2:28 PM 08/08/2021   10:00 AM  Readmission Risk Prevention Plan  Transportation Screening Complete Complete  PCP or Specialist Appt within 3-5 Days Complete   HRI or Home Care Consult Complete   Social Work Consult for Recovery Care Planning/Counseling Complete   Palliative Care Screening Not Applicable   Medication Review Oceanographer) Complete Complete  PCP or Specialist appointment within 3-5 days of discharge  Complete  HRI or Home  Care Consult  Complete  SW Recovery Care/Counseling Consult  Not Complete  Palliative Care Screening  Not Applicable  Skilled Nursing Facility  Not Applicable

## 2024-03-09 NOTE — Progress Notes (Signed)
 PHARMACY CONSULT NOTE FOR:  OUTPATIENT  PARENTERAL ANTIBIOTIC THERAPY (OPAT)  Indication: Psoas abscess Regimen: Daptomycin  600 mg IV every 24 hours + Ertapenem  1g IV every 24 hours End date: 04/10/24  IV antibiotic discharge orders are pended. To discharging provider:  please sign these orders via discharge navigator,  Select New Orders & click on the button choice - Manage This Unsigned Work.     Thank you for allowing pharmacy to be a part of this patients care.  Almarie Lunger, PharmD, BCPS, BCIDP Infectious Diseases Clinical Pharmacist 03/09/2024 10:18 AM   **Pharmacist phone directory can now be found on [email or web address] ([email or web address]).  Listed under [facility].

## 2024-03-09 NOTE — Progress Notes (Signed)
 PT Cancellation Note  Patient Details Name: Charles Lloyd. MRN: [id number] DOB: 22-Sep-1940   Cancelled Treatment:    Reason Eval/Treat Not Completed: Fatigue/lethargy limiting ability to participate. Per discussion with RN, patient is very lethargic, not eating or drinking, not taking medications, and only responds briefly to stimuli by opening his arms and moving blankets but then falls back asleep. RN deferred treatment today, PT continue to follow pt while admitted.   Isaiah DEL. Jilian West, PT, DPT   [employer] 03/09/2024, 1:50 PM

## 2024-03-09 NOTE — Progress Notes (Signed)
 PHARMACY NOTE:  ANTIMICROBIAL RENAL DOSAGE ADJUSTMENT  Current antimicrobial regimen includes a mismatch between antimicrobial dosage and estimated renal function.  As per policy approved by the Pharmacy & Therapeutics and Medical Executive Committees, the antimicrobial dosage will be adjusted accordingly.  Current antimicrobial dosage:  Cefepime  2g IV every 12 hours and Daptomycin  600 mg IV every 24 hours  Indication: Psoas abscess  Renal Function:  Estimated Creatinine Clearance: 24.1 mL/min (A) (by C-G formula based on SCr of 2.33 mg/dL (H)). []      On intermittent HD, scheduled: []      On CRRT    Antimicrobial dosage has been changed to:  Cefepime  2g IV every 24 hours and Daptomycin  600 mg IV every 48 hours  Additional comments:   Thank you for allowing pharmacy to be a part of this patients care.  Almarie Lunger, PharmD, BCPS, BCIDP Infectious Diseases Clinical Pharmacist 03/09/2024 10:12 AM   **Pharmacist phone directory can now be found on [email or web address] ([email or web address]).  Listed under [facility].

## 2024-03-09 NOTE — Plan of Care (Signed)
  Problem: Pain Managment: Goal: General experience of comfort will improve and/or be controlled Outcome: Progressing   Problem: Clinical Measurements: Goal: Ability to maintain clinical measurements within normal limits will improve Outcome: Not Progressing

## 2024-03-09 NOTE — Plan of Care (Signed)
 Patient is currently not able to communicate, unable to eat, unable to take oral medications, or get out of the bed.  Family states how patient presents today is a great decline after being able to ambulate and communicate one week ago.     Problem: Education: Goal: Ability to describe self-care measures that may prevent or decrease complications (Diabetes Survival Skills Education) will improve Outcome: Not Progressing Goal: Individualized Educational Video(s) Outcome: Not Progressing   Problem: Coping: Goal: Ability to adjust to condition or change in health will improve Outcome: Not Progressing   Problem: Fluid Volume: Goal: Ability to maintain a balanced intake and output will improve Outcome: Not Progressing   Problem: Health Behavior/Discharge Planning: Goal: Ability to identify and utilize available resources and services will improve Outcome: Not Progressing Goal: Ability to manage health-related needs will improve Outcome: Not Progressing   Problem: Nutritional: Goal: Maintenance of adequate nutrition will improve Outcome: Not Progressing Goal: Progress toward achieving an optimal weight will improve Outcome: Not Progressing   Problem: Skin Integrity: Goal: Risk for impaired skin integrity will decrease Outcome: Not Progressing   Problem: Tissue Perfusion: Goal: Adequacy of tissue perfusion will improve Outcome: Not Progressing   Problem: Education: Goal: Knowledge of General Education information will improve Description: Including pain rating scale, medication(s)/side effects and non-pharmacologic comfort measures Outcome: Not Progressing   Problem: Health Behavior/Discharge Planning: Goal: Ability to manage health-related needs will improve Outcome: Not Progressing   Problem: Clinical Measurements: Goal: Ability to maintain clinical measurements within normal limits will improve Outcome: Not Progressing Goal: Diagnostic test results will improve Outcome:  Not Progressing   Problem: Activity: Goal: Risk for activity intolerance will decrease Outcome: Not Progressing   Problem: Nutrition: Goal: Adequate nutrition will be maintained Outcome: Not Progressing   Problem: Skin Integrity: Goal: Risk for impaired skin integrity will decrease Outcome: Not Progressing   Problem: Metabolic: Goal: Ability to maintain appropriate glucose levels will improve Outcome: Progressing   Problem: Clinical Measurements: Goal: Will remain free from infection Outcome: Progressing

## 2024-03-09 NOTE — Progress Notes (Signed)
 " PROGRESS NOTE    Charles Lloyd Charles Lloyd.  FMW:[id number] DOB: 1940/08/17 DOA: 02/22/2024 PCP: Theophilus Andrews, Tully GRADE, MD   Brief Narrative:  [phone] with h/o HTN, HLD, T2DM, afib on Eliquis , and L4-5 spondylolisthesis s/p fusion on 12/11 who presented with LLE weakness and pain with falls. He was found to have a 3.5 x 2.4 cm L psoas muscle abscess. Neurosurgery recommended IR drainage and IV antibiotics.  Underwent CT-guided aspiration on 02/24/24. Developed hospital-associated delirium requiring ICU transfer, now improved.  Currently on daptomycin  and cefepime  till 04/13/2024 as per ID recommendations.  Psychiatry consulted on 03/06/2024 for intermittent agitation and as per family request.  Palliative care also consulted for goals of care discussion.  Assessment & Plan:   Left psoas abscess -Had recent transverse lumbar interbody fusion on 02/06/2024. -S/p IR CT guided aspiration of left psoas fluid collection, drain placed.  No growth on fluid and blood cultures - Currently on daptomycin  and cefepime  till 04/13/2024 as per ID recommendations.  Currently has a PICC line. -Awaiting placement.  TOC following.  Denied by insurance for CIR placement.  Delirium Alcohol  withdrawal syndrome with delirium Possible underlying dementia -MRI brain unremarkable -Off precedex  drip.  Completed phenobarbital  taper - Continue delirium precautions and frequent reorientation. - Avoid Ativan : Son was concerned about paradoxical response to Ativan .  Currently added as an allergy -Psychiatry consulted on 03/06/2024 for intermittent agitation and as per family request.  Patient was started on Rozerem  by psychiatry.  Patient subsequently has been very sleepy with intermittent tremors with poor low oral intake.  Family concerned about the same.  Psychiatry following.  Rozerem  has been discontinued.  Monitor mental status.  Paroxysmal A-fib - Currently rate controlled.  Continue flecainide , Coreg  and Eliquis   Chronic  diastolic heart failure Hypertension hyperlipidemia - Compensated.  Continue strict input and output.  Daily weights.  Continue hydralazine , Coreg .  Torsemide  resumed on 03/04/2024.  But held on 03/07/2024 because of increasing creatinine.  Outpatient follow-up with cardiology - Continue statin  AKI - Possibly from poor oral intake recently.  Creatinine worsening to 2.3 today.  Increase normal saline to 125 cc an hour.  Check renal ultrasound.  Monitor.  Diuretic plan as above.  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  Blood sugars currently stable.  Continue CBGs with SSI.  Will not continue insulin  at discharge.  Continue gabapentin   Asthma - No exacerbation.  Continue montelukast   Recurrent falls -Awaiting rehab placement  Wound 02/24/24 1245 Pressure Injury Buttocks Mid;Lower Deep Tissue Pressure Injury - Purple or maroon localized area of discolored intact skin or blood-filled blister due to damage of underlying soft tissue from pressure and/or shear. (Active)     Wound 02/24/24 1245 Pressure Injury Scrotum Circumferential Deep Tissue Pressure Injury - Purple or maroon localized area of discolored intact skin or blood-filled blister due to damage of underlying soft tissue from pressure and/or shear. (Active)  Present to admission: Continue local wound care  Acute on chronic urinary retention - Failed voiding trial on 03/04/2024 and Foley catheter had to be replaced on 03/04/2024.  Continue Flomax .  Outpatient follow-up with urology  Hyponatremia - Sodium improved to 137 today.  Diuretic plan as above.  DC salt tabs. Monitor  Hypokalemia - Improved  Hypomagnesemia - Improved  Anemia of chronic disease - From chronic illnesses.  Hemoglobin stable.  Monitor intermittently  Thrombocytopenia -Questionable cause.  No signs of bleeding.  Monitor  Goals of care - Overall prognosis is  guarded to poor.  Palliative care evaluation appreciated.   Patient remains full code.  DVT prophylaxis: Eliquis  Code Status: Full Family Communication: Son at bedside    Disposition Plan: Status is: Inpatient Remains inpatient appropriate because: Of severity of illness.  Need for SNF placement    Consultants: Neurosurgery/IR/PCCM/ID.  Psychiatry.  palliative care  Procedures: As above  Antimicrobials:  Anti-infectives (From admission, onward)    Start     Dose/Rate Route Frequency Ordered Stop   03/10/24 1400  DAPTOmycin  (CUBICIN ) 600 mg in sodium chloride  0.9 % IVPB        8 mg/kg  79.3 kg 124 mL/hr over 30 Minutes Intravenous Every 48 hours 03/09/24 1013     03/10/24 1000  ceFEPIme  (MAXIPIME ) 2 g in sodium chloride  0.9 % 100 mL IVPB        2 g 200 mL/hr over 30 Minutes Intravenous Every 24 hours 03/09/24 1013     03/07/24 1800  ceFEPIme  (MAXIPIME ) 2 g in sodium chloride  0.9 % 100 mL IVPB  Status:  Discontinued        2 g 200 mL/hr over 30 Minutes Intravenous Every 12 hours 03/07/24 1118 03/09/24 1013   02/28/24 2200  ceFEPIme  (MAXIPIME ) 2 g in sodium chloride  0.9 % 100 mL IVPB  Status:  Discontinued        2 g 200 mL/hr over 30 Minutes Intravenous Every 8 hours 02/28/24 1440 03/07/24 1118   02/28/24 1530  DAPTOmycin  (CUBICIN ) 600 mg in sodium chloride  0.9 % IVPB  Status:  Discontinued        8 mg/kg  79.3 kg 124 mL/hr over 30 Minutes Intravenous Daily 02/28/24 1440 03/09/24 1013   02/28/24 0000  daptomycin  (CUBICIN ) IVPB  Status:  Discontinued        600 mg Intravenous Every 24 hours 02/28/24 1444 03/09/24    02/28/24 0000  ceFEPime  (MAXIPIME ) IVPB  Status:  Discontinued        2 g Intravenous Every 8 hours 02/28/24 1444 03/09/24    02/23/24 2200  vancomycin  (VANCOREADY) IVPB 1500 mg/300 mL  Status:  Discontinued        1,500 mg 150 mL/hr over 120 Minutes Intravenous Every 24 hours 02/22/24 2348 02/23/24 0242   02/23/24 1000  linezolid  (ZYVOX ) IVPB 600 mg  Status:  Discontinued       Note to Pharmacy: Pt developed rash shortly  after vancomycin  administration.   600 mg 300 mL/hr over 60 Minutes Intravenous Every 12 hours 02/23/24 0242 02/28/24 1440   02/23/24 0800  piperacillin -tazobactam (ZOSYN ) IVPB 3.375 g  Status:  Discontinued        3.375 g 12.5 mL/hr over 240 Minutes Intravenous Every 8 hours 02/22/24 2348 02/28/24 1440   02/22/24 2315  piperacillin -tazobactam (ZOSYN ) IVPB 3.375 g        3.375 g 100 mL/hr over 30 Minutes Intravenous  Once 02/22/24 2306 02/23/24 0013   02/22/24 2315  vancomycin  (VANCOREADY) IVPB 1500 mg/300 mL        1,500 mg 150 mL/hr over 120 Minutes Intravenous  Once 02/22/24 2306 02/23/24 0141        Subjective: Patient seen and examined at bedside.  Poor historian.  No fever, agitation, seizures reported.   Objective: Vitals:   03/08/24 1424 03/08/24 2203 03/09/24 0629 03/09/24 1424  BP: (!) 97/44 (!) 110/56 97/83 135/67  Pulse: 92 89 88 90  Resp:  18 18 18   Temp: 99 F (37.2 C) 98.3 F (36.8 C) 98.3 F (36.8  C) 98.5 F (36.9 C)  TempSrc: Oral  Oral Oral  SpO2:  96% 96% 94%  Weight:      Height:        Intake/Output Summary (Last 24 hours) at 03/09/2024 1530 Last data filed at 03/09/2024 0000 Gross per 24 hour  Intake 569.18 ml  Output 500 ml  Net 69.18 ml   Filed Weights   02/22/24 1238 02/24/24 1245  Weight: 79.4 kg 79.3 kg    Examination:  General: On room air.  No distress.  Elderly male lying in bed. ENT/neck: No palpable thyromegaly or elevated JVD noted  respiratory: Bilateral decreased pressures at bases with some crackles CVS: S1 and S2 heard; rate mostly controlled  abdominal: Soft, nontender, remains slightly distended; no organomegaly, normal bowel sounds are heard  extremities: Trace lower extremity edema; no cyanosis CNS: Remains extremely slow to respond.  Poor historian.  Having intermittent upper extremity tremors  lymph: No palpable lymphadenopathy noted skin: No obvious ecchymosis/lesions  psych: Not agitated.  Currently not  agitated genitourinary: Foley catheter present musculoskeletal: No obvious joint swelling/deformity   Data Reviewed: I have personally reviewed following labs and imaging studies  CBC: Recent Labs  Lab 03/03/24 0950 03/04/24 0408 03/05/24 0306 03/07/24 0350  WBC 4.7 4.3 4.4 6.1  NEUTROABS  --   --  2.3 3.4  HGB 9.2* 8.1* 9.2* 9.4*  HCT 26.8* 24.5* 26.7* 27.6*  MCV 86.7 88.1 85.9 86.8  PLT 90* 80* 95* 129*   Basic Metabolic Panel: Recent Labs  Lab 03/05/24 0306 03/06/24 0434 03/07/24 0350 03/08/24 0304 03/09/24 0304  NA 126* 130* 131* 133* 137  K 3.3* 3.2* 3.4* 3.6 3.6  CL 94* 97* 95* 97* 102  CO2 23 24 24 23  20*  GLUCOSE 108* 105* 114* 109* 125*  BUN 11 12 14 19  32*  CREATININE 0.90 1.20 1.31* 1.58* 2.33*  CALCIUM  9.3 9.2 9.8 9.6 8.7*  MG 1.6* 1.7 1.8 1.9 1.8   GFR: Estimated Creatinine Clearance: 24.1 mL/min (A) (by C-G formula based on SCr of 2.33 mg/dL (H)). Liver Function Tests: Recent Labs  Lab 03/09/24 0304  AST 36  ALT 13  ALKPHOS 103  BILITOT 0.8  PROT 5.7*  ALBUMIN  2.8*   No results for input(s): LIPASE, AMYLASE in the last 168 hours. No results for input(s): AMMONIA in the last 168 hours. Coagulation Profile: No results for input(s): INR, PROTIME in the last 168 hours. Cardiac Enzymes: Recent Labs  Lab 03/09/24 0304  CKTOTAL 190    BNP (last 3 results) No results for input(s): PROBNP in the last 8760 hours. HbA1C: No results for input(s): HGBA1C in the last 72 hours. CBG: Recent Labs  Lab 03/08/24 1140 03/08/24 1644 03/09/24 0049 03/09/24 0747 03/09/24 1136  GLUCAP 117* 109* 140* 125* 121*   Lipid Profile: No results for input(s): CHOL, HDL, LDLCALC, TRIG, CHOLHDL, LDLDIRECT in the last 72 hours. Thyroid  Function Tests: No results for input(s): TSH, T4TOTAL, FREET4, T3FREE, THYROIDAB in the last 72 hours. Anemia Panel: No results for input(s): VITAMINB12, FOLATE, FERRITIN, TIBC,  IRON, RETICCTPCT in the last 72 hours. Sepsis Labs: No results for input(s): PROCALCITON, LATICACIDVEN in the last 168 hours.  No results found for this or any previous visit (from the past 240 hours).        Radiology Studies: US  RENAL Result Date: 03/09/2024 CLINICAL DATA:  Acute kidney injury. EXAM: RENAL / URINARY TRACT ULTRASOUND COMPLETE COMPARISON:  12/30/2020 FINDINGS: Right Kidney: Renal measurements: 9.0 x 5.7 x  4.9 cm = volume: 132 mL. Echogenicity within normal limits. No mass or hydronephrosis visualized. Left Kidney: Renal measurements: 10.3 x 5.5 x 4.6 cm = volume: 136 mL. Echogenicity within normal limits. No mass or hydronephrosis visualized. Bladder: Bladder is decompressed with a Foley catheter. Other: Anechoic structure in the right hepatic lobe near the right kidney. This is most compatible with a cyst and measures 1.7 x 1.1 x 1.4 cm. IMPRESSION: 1. Normal appearance of both kidneys.  No hydronephrosis. 2. Bladder is decompressed with a Foley catheter. Electronically Signed   By: Juliene Balder M.D.   On: 03/09/2024 13:44        Scheduled Meds:  [START ON 03/10/2024] allopurinol   100 mg Oral Daily   apixaban   2.5 mg Oral BID   atorvastatin   20 mg Oral QHS   carvedilol   3.125 mg Oral BID   Chlorhexidine  Gluconate Cloth  6 each Topical Daily   cycloSPORINE   1 drop Both Eyes Daily   flecainide   50 mg Oral BID   folic acid   1 mg Oral Daily   hydrALAZINE   25 mg Oral BID   hydrocortisone  cream   Topical TID   insulin  aspart  0-6 Units Subcutaneous TID AC & HS   montelukast   10 mg Oral QHS   sodium chloride  flush  3 mL Intravenous Q12H   sodium chloride   1 g Oral TID WC   tamsulosin   0.4 mg Oral QPC supper   thiamine   100 mg Oral Daily   Continuous Infusions:  sodium chloride  125 mL/hr at 03/09/24 1400   [START ON 03/10/2024] ceFEPime  (MAXIPIME ) IV     [START ON 03/10/2024] DAPTOmycin             Sophie Mao, MD [facility] 03/09/2024, 3:30 PM    "

## 2024-03-09 NOTE — Progress Notes (Signed)
 " PROGRESS NOTE    Charles Lloyd Juanito Mickey.  FMW:[id number] DOB: [birth year] DOA: 02/22/2024 PCP: Theophilus Andrews, Tully GRADE, MD   Brief Narrative:  [phone] with h/o HTN, HLD, T2DM, afib on Eliquis , and L4-5 spondylolisthesis s/p fusion on 12/11 who presented with LLE weakness and pain with falls. He was found to have a 3.5 x 2.4 cm L psoas muscle abscess. Neurosurgery recommended IR drainage and IV antibiotics.  Underwent CT-guided aspiration on 02/24/24. Developed hospital-associated delirium requiring ICU transfer, now improved.  Currently on daptomycin  and cefepime  till 04/13/2024 as per ID recommendations.  Psychiatry consulted on 03/06/2024 for intermittent agitation and as per family request.  Palliative care also consulted for goals of care discussion.  Assessment & Plan:   Left psoas abscess -Had recent transverse lumbar interbody fusion on 02/06/2024. -S/p IR CT guided aspiration of left psoas fluid collection, drain placed.  No growth on fluid and blood cultures - Currently on daptomycin  and cefepime  till 04/13/2024 as per ID recommendations.  Currently has a PICC line. -Awaiting placement.  TOC following.  Denied by insurance for CIR placement.  Delirium Alcohol  withdrawal syndrome with delirium Possible underlying dementia -MRI brain unremarkable -Off precedex  drip.  Completed phenobarbital  taper - Continue delirium precautions and frequent reorientation. - Avoid Ativan : Son was concerned about paradoxical response to Ativan .  Currently added as an allergy -Psychiatry consulted on 03/06/2024 for intermittent agitation and as per family request.  Patient was started on Rozerem  by psychiatry.  Patient subsequently has been very sleepy with intermittent tremors with poor low oral intake.  Family concerned about the same.  Psychiatry following.  Rozerem  has been discontinued.  Monitor mental status.  Paroxysmal A-fib - Currently rate controlled.  Continue flecainide , Coreg  and Eliquis   Chronic  diastolic heart failure Hypertension hyperlipidemia - Compensated.  Continue strict input and output.  Daily weights.  Continue hydralazine , Coreg .  Torsemide  resumed on 03/04/2024.  But held on 03/07/2024 because of increasing creatinine.  Outpatient follow-up with cardiology - Continue statin  AKI - Possibly from poor oral intake recently.  Creatinine worsening to 2.3 today.  Increase normal saline to 125 cc an hour.  Check renal ultrasound.  Monitor.  Diuretic plan as above.  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  Blood sugars currently stable.  Continue CBGs with SSI.  Will not continue insulin  at discharge.  Continue gabapentin   Asthma - No exacerbation.  Continue montelukast   Recurrent falls -Awaiting rehab placement  Wound 02/24/24 1245 Pressure Injury Buttocks Mid;Lower Deep Tissue Pressure Injury - Purple or maroon localized area of discolored intact skin or blood-filled blister due to damage of underlying soft tissue from pressure and/or shear. (Active)     Wound 02/24/24 1245 Pressure Injury Scrotum Circumferential Deep Tissue Pressure Injury - Purple or maroon localized area of discolored intact skin or blood-filled blister due to damage of underlying soft tissue from pressure and/or shear. (Active)  Present to admission: Continue local wound care  Acute on chronic urinary retention - Failed voiding trial on 03/04/2024 and Foley catheter had to be replaced on 03/04/2024.  Continue Flomax .  Outpatient follow-up with urology  Hyponatremia - Sodium improved to 137 today.  Diuretic plan as above.  DC salt tabs. Monitor  Hypokalemia - Improved  Hypomagnesemia - Improved  Anemia of chronic disease - From chronic illnesses.  Hemoglobin stable.  Monitor intermittently  Thrombocytopenia -Questionable cause.  No signs of bleeding.  Monitor  Goals of care - Overall prognosis is  guarded to poor.  Palliative care evaluation appreciated.   Patient remains full code.  DVT prophylaxis: Eliquis  Code Status: Full Family Communication: Son and daughter-in-law at bedside on 03/08/2024.  None at bedside today. Disposition Plan: Status is: Inpatient Remains inpatient appropriate because: Of severity of illness.  Need for SNF placement    Consultants: Neurosurgery/IR/PCCM/ID.  Psychiatry.  palliative care  Procedures: As above  Antimicrobials:  Anti-infectives (From admission, onward)    Start     Dose/Rate Route Frequency Ordered Stop   03/07/24 1800  ceFEPIme  (MAXIPIME ) 2 g in sodium chloride  0.9 % 100 mL IVPB        2 g 200 mL/hr over 30 Minutes Intravenous Every 12 hours 03/07/24 1118     02/28/24 2200  ceFEPIme  (MAXIPIME ) 2 g in sodium chloride  0.9 % 100 mL IVPB  Status:  Discontinued        2 g 200 mL/hr over 30 Minutes Intravenous Every 8 hours 02/28/24 1440 03/07/24 1118   02/28/24 1530  DAPTOmycin  (CUBICIN ) 600 mg in sodium chloride  0.9 % IVPB        8 mg/kg  79.3 kg 124 mL/hr over 30 Minutes Intravenous Daily 02/28/24 1440     02/28/24 0000  daptomycin  (CUBICIN ) IVPB        600 mg Intravenous Every 24 hours 02/28/24 1444 04/10/24 2359   02/28/24 0000  ceFEPime  (MAXIPIME ) IVPB        2 g Intravenous Every 8 hours 02/28/24 1444 04/10/24 2359   02/23/24 2200  vancomycin  (VANCOREADY) IVPB 1500 mg/300 mL  Status:  Discontinued        1,500 mg 150 mL/hr over 120 Minutes Intravenous Every 24 hours 02/22/24 2348 02/23/24 0242   02/23/24 1000  linezolid  (ZYVOX ) IVPB 600 mg  Status:  Discontinued       Note to Pharmacy: Pt developed rash shortly after vancomycin  administration.   600 mg 300 mL/hr over 60 Minutes Intravenous Every 12 hours 02/23/24 0242 02/28/24 1440   02/23/24 0800  piperacillin -tazobactam (ZOSYN ) IVPB 3.375 g  Status:  Discontinued        3.375 g 12.5 mL/hr over 240 Minutes Intravenous Every 8 hours 02/22/24 2348 02/28/24 1440   02/22/24 2315  piperacillin -tazobactam (ZOSYN ) IVPB 3.375 g        3.375  g 100 mL/hr over 30 Minutes Intravenous  Once 02/22/24 2306 02/23/24 0013   02/22/24 2315  vancomycin  (VANCOREADY) IVPB 1500 mg/300 mL        1,500 mg 150 mL/hr over 120 Minutes Intravenous  Once 02/22/24 2306 02/23/24 0141        Subjective: Patient seen and examined at bedside.  Poor historian.  No fever, agitation, seizures reported.   Objective: Vitals:   03/08/24 0609 03/08/24 1424 03/08/24 2203 03/09/24 0629  BP: 100/68 (!) 97/44 (!) 110/56 97/83  Pulse: 98 92 89 88  Resp: 16  18 18   Temp: 99 F (37.2 C) 99 F (37.2 C) 98.3 F (36.8 C) 98.3 F (36.8 C)  TempSrc: Oral Oral  Oral  SpO2: 100%  96% 96%  Weight:      Height:        Intake/Output Summary (Last 24 hours) at 03/09/2024 9187 Last data filed at 03/09/2024 0000 Gross per 24 hour  Intake 569.18 ml  Output 500 ml  Net 69.18 ml   Filed Weights   02/22/24 1238 02/24/24 1245  Weight: 79.4 kg 79.3 kg    Examination:  General: On room air.  No  distress.  Elderly male lying in bed. ENT/neck: No palpable thyromegaly or elevated JVD noted  respiratory: Bilateral decreased pressures at bases with some crackles CVS: S1 and S2 heard; rate mostly controlled  abdominal: Soft, nontender, remains slightly distended; no organomegaly, normal bowel sounds are heard  extremities: Trace lower extremity edema; no cyanosis CNS: Remains extremely slow to respond.  Poor historian.  Having intermittent upper extremity tremors  lymph: No palpable lymphadenopathy noted skin: No obvious ecchymosis/lesions  psych: Not agitated.  Currently not agitated genitourinary: Foley catheter present musculoskeletal: No obvious joint swelling/deformity   Data Reviewed: I have personally reviewed following labs and imaging studies  CBC: Recent Labs  Lab 03/02/24 1059 03/03/24 0950 03/04/24 0408 03/05/24 0306 03/07/24 0350  WBC 4.8 4.7 4.3 4.4 6.1  NEUTROABS  --   --   --  2.3 3.4  HGB 9.7* 9.2* 8.1* 9.2* 9.4*  HCT 28.8* 26.8*  24.5* 26.7* 27.6*  MCV 88.1 86.7 88.1 85.9 86.8  PLT 104* 90* 80* 95* 129*   Basic Metabolic Panel: Recent Labs  Lab 03/05/24 0306 03/06/24 0434 03/07/24 0350 03/08/24 0304 03/09/24 0304  NA 126* 130* 131* 133* 137  K 3.3* 3.2* 3.4* 3.6 3.6  CL 94* 97* 95* 97* 102  CO2 23 24 24 23  20*  GLUCOSE 108* 105* 114* 109* 125*  BUN 11 12 14 19  32*  CREATININE 0.90 1.20 1.31* 1.58* 2.33*  CALCIUM  9.3 9.2 9.8 9.6 8.7*  MG 1.6* 1.7 1.8 1.9 1.8   GFR: Estimated Creatinine Clearance: 24.1 mL/min (A) (by C-G formula based on SCr of 2.33 mg/dL (H)). Liver Function Tests: Recent Labs  Lab 03/09/24 0304  AST 36  ALT 13  ALKPHOS 103  BILITOT 0.8  PROT 5.7*  ALBUMIN  2.8*   No results for input(s): LIPASE, AMYLASE in the last 168 hours. No results for input(s): AMMONIA in the last 168 hours. Coagulation Profile: No results for input(s): INR, PROTIME in the last 168 hours. Cardiac Enzymes: Recent Labs  Lab 03/09/24 0304  CKTOTAL 190    BNP (last 3 results) No results for input(s): PROBNP in the last 8760 hours. HbA1C: No results for input(s): HGBA1C in the last 72 hours. CBG: Recent Labs  Lab 03/08/24 0728 03/08/24 1140 03/08/24 1644 03/09/24 0049 03/09/24 0747  GLUCAP 109* 117* 109* 140* 125*   Lipid Profile: No results for input(s): CHOL, HDL, LDLCALC, TRIG, CHOLHDL, LDLDIRECT in the last 72 hours. Thyroid  Function Tests: No results for input(s): TSH, T4TOTAL, FREET4, T3FREE, THYROIDAB in the last 72 hours. Anemia Panel: No results for input(s): VITAMINB12, FOLATE, FERRITIN, TIBC, IRON, RETICCTPCT in the last 72 hours. Sepsis Labs: No results for input(s): PROCALCITON, LATICACIDVEN in the last 168 hours.  No results found for this or any previous visit (from the past 240 hours).        Radiology Studies: No results found.      Scheduled Meds:  allopurinol   300 mg Oral Daily   apixaban   5 mg Oral BID    atorvastatin   20 mg Oral QHS   carvedilol   3.125 mg Oral BID   Chlorhexidine  Gluconate Cloth  6 each Topical Daily   cycloSPORINE   1 drop Both Eyes Daily   flecainide   100 mg Oral BID   folic acid   1 mg Oral Daily   gabapentin   300 mg Oral Daily   gabapentin   600 mg Oral QPM   hydrALAZINE   25 mg Oral BID   hydrocortisone  cream   Topical TID  insulin  aspart  0-6 Units Subcutaneous TID AC & HS   montelukast   10 mg Oral QHS   sodium chloride  flush  3 mL Intravenous Q12H   sodium chloride   1 g Oral TID WC   tamsulosin   0.4 mg Oral QPC supper   thiamine   100 mg Oral Daily   Continuous Infusions:  sodium chloride  75 mL/hr at 03/08/24 1449   ceFEPime  (MAXIPIME ) IV 2 g (03/09/24 9361)   DAPTOmycin  600 mg (03/08/24 1404)          Sophie Mao, MD [facility] 03/09/2024, 8:12 AM   "

## 2024-03-10 ENCOUNTER — Inpatient Hospital Stay (HOSPITAL_COMMUNITY)

## 2024-03-10 DIAGNOSIS — N179 Acute kidney failure, unspecified: Secondary | ICD-10-CM

## 2024-03-10 DIAGNOSIS — Z515 Encounter for palliative care: Secondary | ICD-10-CM

## 2024-03-10 DIAGNOSIS — K6812 Psoas muscle abscess: Secondary | ICD-10-CM | POA: Diagnosis not present

## 2024-03-10 DIAGNOSIS — R410 Disorientation, unspecified: Secondary | ICD-10-CM | POA: Diagnosis not present

## 2024-03-10 DIAGNOSIS — G9340 Encephalopathy, unspecified: Secondary | ICD-10-CM

## 2024-03-10 DIAGNOSIS — R296 Repeated falls: Secondary | ICD-10-CM

## 2024-03-10 DIAGNOSIS — R471 Dysarthria and anarthria: Secondary | ICD-10-CM | POA: Diagnosis not present

## 2024-03-10 DIAGNOSIS — K6282 Dysplasia of anus: Secondary | ICD-10-CM | POA: Diagnosis not present

## 2024-03-10 DIAGNOSIS — E871 Hypo-osmolality and hyponatremia: Secondary | ICD-10-CM

## 2024-03-10 DIAGNOSIS — F10231 Alcohol dependence with withdrawal delirium: Secondary | ICD-10-CM

## 2024-03-10 LAB — COMPREHENSIVE METABOLIC PANEL WITH GFR
ALT: 26 U/L (ref 0–44)
AST: 104 U/L — ABNORMAL HIGH (ref 15–41)
Albumin: 2.7 g/dL — ABNORMAL LOW (ref 3.5–5.0)
Alkaline Phosphatase: 109 U/L (ref 38–126)
Anion gap: 14 (ref 5–15)
BUN: 42 mg/dL — ABNORMAL HIGH (ref 8–23)
CO2: 19 mmol/L — ABNORMAL LOW (ref 22–32)
Calcium: 8.5 mg/dL — ABNORMAL LOW (ref 8.9–10.3)
Chloride: 108 mmol/L (ref 98–111)
Creatinine, Ser: 2.6 mg/dL — ABNORMAL HIGH (ref 0.61–1.24)
GFR, Estimated: 24 mL/min — ABNORMAL LOW
Glucose, Bld: 113 mg/dL — ABNORMAL HIGH (ref 70–99)
Potassium: 3.4 mmol/L — ABNORMAL LOW (ref 3.5–5.1)
Sodium: 141 mmol/L (ref 135–145)
Total Bilirubin: 0.7 mg/dL (ref 0.0–1.2)
Total Protein: 5.4 g/dL — ABNORMAL LOW (ref 6.5–8.1)

## 2024-03-10 LAB — URINALYSIS, ROUTINE W REFLEX MICROSCOPIC
Bacteria, UA: NONE SEEN
RBC / HPF: >50 RBC/hpf (ref 0–5)

## 2024-03-10 LAB — CBC WITH DIFFERENTIAL/PLATELET
Abs Immature Granulocytes: 0.08 K/uL — ABNORMAL HIGH (ref 0.00–0.07)
Basophils Absolute: 0 K/uL (ref 0.0–0.1)
Basophils Relative: 0 %
Eosinophils Absolute: 0.1 K/uL (ref 0.0–0.5)
Eosinophils Relative: 1 %
HCT: 25.5 % — ABNORMAL LOW (ref 39.0–52.0)
Hemoglobin: 8.3 g/dL — ABNORMAL LOW (ref 13.0–17.0)
Immature Granulocytes: 1 %
Lymphocytes Relative: 12 %
Lymphs Abs: 1.2 K/uL (ref 0.7–4.0)
MCH: 29.5 pg (ref 26.0–34.0)
MCHC: 32.5 g/dL (ref 30.0–36.0)
MCV: 90.7 fL (ref 80.0–100.0)
Monocytes Absolute: 1 K/uL (ref 0.1–1.0)
Monocytes Relative: 11 %
Neutro Abs: 7.3 K/uL (ref 1.7–7.7)
Neutrophils Relative %: 75 %
Platelets: 171 K/uL (ref 150–400)
RBC: 2.81 MIL/uL — ABNORMAL LOW (ref 4.22–5.81)
RDW: 17.8 % — ABNORMAL HIGH (ref 11.5–15.5)
WBC: 9.7 K/uL (ref 4.0–10.5)
nRBC: 0 % (ref 0.0–0.2)

## 2024-03-10 LAB — VITAMIN B12: Vitamin B-12: 718 pg/mL (ref 180–914)

## 2024-03-10 LAB — GLUCOSE, CAPILLARY
Glucose-Capillary: 101 mg/dL — ABNORMAL HIGH (ref 70–99)
Glucose-Capillary: 112 mg/dL — ABNORMAL HIGH (ref 70–99)
Glucose-Capillary: 110 mg/dL — ABNORMAL HIGH (ref 70–99)
Glucose-Capillary: 130 mg/dL — ABNORMAL HIGH (ref 70–99)

## 2024-03-10 LAB — MAGNESIUM: Magnesium: 1.8 mg/dL (ref 1.7–2.4)

## 2024-03-10 MED ORDER — CIPROFLOXACIN IN D5W 400 MG/200ML IV SOLN
400.0000 mg | INTRAVENOUS | Status: DC
  Administered 2024-03-11 – 2024-03-13 (×3): 400 mg via INTRAVENOUS
  Filled 2024-03-10 (×3): qty 200

## 2024-03-10 MED ORDER — ACETAMINOPHEN 10 MG/ML IV SOLN
1000.0000 mg | Freq: Three times a day (TID) | INTRAVENOUS | Status: AC
  Administered 2024-03-10 – 2024-03-11 (×3): 1000 mg via INTRAVENOUS
  Filled 2024-03-10 (×3): qty 100

## 2024-03-10 MED ORDER — LACTATED RINGERS IV SOLN: INTRAVENOUS | Status: DC

## 2024-03-10 NOTE — Consult Note (Addendum)
 Nephrology Consult   Requesting provider: Dr. Cheryle Service requesting consult: [facility] Reason for consult: AKI  Assessment/Recommendations:   AKI -normal baseline Cr. No obstruction on renal ultrasound. Suspect hemodynamic insults, had some intermittent low Bps. Cannot rule out AIN in the context of cefepime , urine sediment pending -continue with supportive care, switching fluids to LR: 100cc/hr x 1 day -will check serologies in the interim: ANCA, ANA, SPEP, FLC -Avoid nephrotoxic medications including NSAIDs and iodinated intravenous contrast exposure unless the latter is absolutely indicated.  Preferred narcotic agents for pain control are hydromorphone , fentanyl , and methadone. Morphine  should not be used. Avoid Baclofen and avoid oral sodium phosphate  and magnesium  citrate based laxatives / bowel preps. Continue strict Input and Output monitoring. Will monitor the patient closely with you and intervene or adjust therapy as indicated by changes in clinical status/labs   Hyponatremia -resolved, stopping salt tabs, monitor  Metabolic acidosis -mild, watching for now, LR plan as above  Left psoas abscess -receiving dapto and cefepime , consider alternative agents, appreciate ID's assistance  Delirium EtOH withdrawals -per primary  Myoclonic jerks -possibly cefepime  related? CK WNL (on dapto) 1/12  Chronic diastolic CHF -agree with holding torsemide  for now  Anemia -transfuse prn for hgb <7  DM2 -mgmt per primary service  Recommendations conveyed to primary service. Discussed with ID Dr Dennise.   [name] 03/10/2024 12:17 PM   _____________________________________________________________________________________   History of Present Illness: Charles Lloyd. is a/an 84 y.o. male with a past medical history of hypertension, DM2, A-fib on Eliquis , recent lumbar fusion for spondylolithiasis, hyperlipidemia who presents to [facility] with left lower  extremity weakness, associated with pain and falls.  Found to have a left psoas muscle abscess.  Underwent CT-guided aspiration on 12/29, subsequently developed hospital-acquired delirium.  On daptomycin  and cefepime  until 04/13/2024.  Palliative also involved for goals of care discussion. Patient seen in room. Awake but not following any commands.   Medications:  Current Facility-Administered Medications  Medication Dose Route Frequency Provider Last Rate Last Admin   0.9 %  sodium chloride  infusion   Intravenous Continuous Cheryle Page, MD 125 mL/hr at 03/10/24 0629 New Bag at 03/10/24 0629   acetaminophen  (TYLENOL ) tablet 650 mg  650 mg Oral Q6H PRN Opyd, Timothy S, MD   650 mg at 03/06/24 1503   Or   acetaminophen  (TYLENOL ) suppository 650 mg  650 mg Rectal Q6H PRN Opyd, Evalene GORMAN, MD       allopurinol  (ZYLOPRIM ) tablet 100 mg  100 mg Oral Daily Cheryle, Kshitiz, MD   100 mg at 03/10/24 1052   apixaban  (ELIQUIS ) tablet 2.5 mg  2.5 mg Oral BID Cheryle Page, MD   2.5 mg at 03/10/24 1052   atorvastatin  (LIPITOR) tablet 20 mg  20 mg Oral QHS Crozier, Peyton, PA-C   20 mg at 03/07/24 2315   bisacodyl  (DULCOLAX) EC tablet 5 mg  5 mg Oral Daily PRN Opyd, Timothy S, MD       carvedilol  (COREG ) tablet 3.125 mg  3.125 mg Oral BID Crozier, Peyton, PA-C   3.125 mg at 03/10/24 1052   ceFEPIme  (MAXIPIME ) 2 g in sodium chloride  0.9 % 100 mL IVPB  2 g Intravenous Q24H Gladis Almarie PARAS, RPH 200 mL/hr at 03/10/24 1048 2 g at 03/10/24 1048   Chlorhexidine  Gluconate Cloth 2 % PADS 6 each  6 each Topical Daily Barbarann Nest, MD   6 each at 03/10/24 0900   cycloSPORINE  (RESTASIS ) 0.05 % ophthalmic emulsion  1 drop  1 drop Both Eyes Daily Opyd, Evalene RAMAN, MD   1 drop at 03/08/24 1844   DAPTOmycin  (CUBICIN ) 600 mg in sodium chloride  0.9 % IVPB  8 mg/kg Intravenous Q48H Gladis Almarie PARAS, [facility]       flecainide  (TAMBOCOR ) tablet 50 mg  50 mg Oral BID Cheryle Page, MD   50 mg at 03/10/24 1051   folic acid   (FOLVITE ) tablet 1 mg  1 mg Oral Daily Crozier, Peyton, PA-C   1 mg at 03/10/24 1052   hydrALAZINE  (APRESOLINE ) tablet 25 mg  25 mg Oral BID Crozier, Peyton, PA-C   25 mg at 03/10/24 1052   hydrocortisone  cream 1 %   Topical TID Jillian Buttery, MD   Given at 03/08/24 2312   insulin  aspart (novoLOG ) injection 0-6 Units  0-6 Units Subcutaneous TID AC & HS Barbarann Nest, MD   3 Units at 03/07/24 2313   lip balm (CARMEX) ointment 1 Application  1 Application Topical PRN Cheryle Page, MD       liver oil-zinc  oxide (DESITIN) 40 % ointment   Topical Daily PRN Cheryle Page, MD       montelukast  (SINGULAIR ) tablet 10 mg  10 mg Oral QHS Crozier, Peyton, PA-C   10 mg at 03/07/24 2315   Oral care mouth rinse  15 mL Mouth Rinse PRN Barbarann Nest, MD       oxybutynin  (DITROPAN ) tablet 5 mg  5 mg Oral Q8H PRN Cheryle Page, MD   5 mg at 03/06/24 2134   senna-docusate (Senokot-S) tablet 1 tablet  1 tablet Oral QHS PRN Opyd, Timothy S, MD       sodium chloride  flush (NS) 0.9 % injection 10-40 mL  10-40 mL Intracatheter PRN Opyd, Evalene RAMAN, MD       sodium chloride  flush (NS) 0.9 % injection 10-40 mL  10-40 mL Intracatheter PRN Barbarann Nest, MD       sodium chloride  flush (NS) 0.9 % injection 3 mL  3 mL Intravenous Q12H Opyd, Evalene RAMAN, MD   3 mL at 03/07/24 2316   sodium chloride  tablet 1 g  1 g Oral TID WC Alekh, Kshitiz, MD   1 g at 03/10/24 0900   tamsulosin  (FLOMAX ) capsule 0.4 mg  0.4 mg Oral QPC supper Barbarann Nest, MD   0.4 mg at 03/07/24 1807   thiamine  (VITAMIN B1) tablet 100 mg  100 mg Oral Daily Jillian Buttery, MD   100 mg at 03/10/24 1052     ALLERGIES Amlodipine , Ativan  [lorazepam ], and Chlorhexidine  gluconate  MEDICAL HISTORY Past Medical History:  Diagnosis Date   Acute tracheobronchitis 01/05/2019   B12 deficiency    Basal cell carcinoma (BCC) of left side of nose 01/28/2018   Cancer (HCC)    Cardiomyopathy (HCC) 03/10/2018   With chronic atrial fibrillation   CHF  (congestive heart failure) (HCC) 04/16/2013   Functional class II, ejection fraction 35-40%  Formatting of this note might be different from the original. Functional class II, ejection fraction 35-40%   Chronic anticoagulation    Chronic diastolic (congestive) heart failure (HCC) 04/16/2013   Functional class II, ejection fraction 35-40%  Last Assessment & Plan:  Clinically stable Volume well controlled meds reviewed   Chronic diastolic CHF (congestive heart failure) (HCC) 04/16/2013   Functional class II, ejection fraction 35-40%  Last Assessment & Plan:  Clinically stable Volume well controlled meds reviewed   Colon polyps 04/16/2013   Coronary artery disease involving native coronary artery of native  heart without angina pectoris 04/05/2022   Myoview  05/25/2019: EF 60, normal perfusion, low risk CCTA (AF protocol) 04/25/20: CAC score 4242 (99th percentile)   Diabetic neuropathy (HCC)    Difficult intubation    pt reports difficult intubation during surgery in [place] in 2015   Dysrhythmia    A.Fib   Eosinophil count raised 02/17/2019   Essential hypertension 01/06/2010   Last Assessment & Plan:  Well controlled Continue med management   Gout    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  Steroid usage was discontinued several months ago.  Follow up with Dermatology as planned.   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   Last Assessment & Plan:  Id following ?ongoing abx managementy reported Request records   Infection of prosthetic right knee joint    Insomnia 03/04/2012   Grief with loss of wife 02/20/12  Last  Assessment & Plan:  Improved sx  contineu xanax prn Se discussed   Lesion of liver 04/20/2019   -On cardiac CT 02/2019   Mild reactive airways disease    Neoplasm of prostate 04/16/2013   Neoplasm of prostate, 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 Transthoracic Echocardiogram-03/10/2013-[facility]: Normal left ventricular wall thickness and cavity size.  Global left ventricular systolic function is moderately reduced.  The estimated ejection fraction is 35-40%.  The left atrium is moderately enlarged.  The right atrium is mildly enlarged.  No significant valvular    Overweight (BMI 25.0-29.9) 10/22/2016   Persistent atrial fibrillation (HCC) 04/16/2013   Last Assessment & Plan:  Rate controlled Continue med management eliquis  for stroke preventino  Formatting of this note might be different from the original.  Drug  HX Current Rx Pre-ABL inefficacy Pre-ABL intolerant Post-ABL inefficacy Post-ABL intolerant max dose/24h M/Y end comments  sotalol                  dofetilide                  flecainide                   propafenone                  am   S/P TKR (total knee replacement), bilateral    Secondary hypercoagulable state 04/09/2019   Tinea cruris 04/16/2013   Type 2 diabetes mellitus with diabetic polyneuropathy, without long-term current use of insulin  (HCC) 04/16/2013   Last Assessment & Plan:  Labs today Pt reports well controlled on ambulatory monitoring   Vitamin D  deficiency 07/25/2018     SOCIAL HISTORY Social History   Socioeconomic History   Marital status: Widowed    Spouse name: Not on file   Number of children: Not on file   Years of education: Not on file    Highest education level: Not on file  Occupational History   Not on file  Tobacco Use   Smoking status: Never    Passive exposure: Never   Smokeless tobacco: Never  Vaping Use   Vaping status: Never Used  Substance and Sexual Activity   Alcohol  use: Not Currently    Alcohol /week: 14.0 standard drinks of alcohol     Types: 14 Shots of liquor per week    Comment: sips on rum and has brandy before bed nightly   Drug use: Never   Sexual activity: Not Currently  Other Topics Concern   Not on file  Social History Narrative   Not on file   Social Drivers of Health   Tobacco Use: Low Risk (02/22/2024)   Patient History    Smoking Tobacco Use: Never    Smokeless Tobacco Use: Never    Passive Exposure: Never  Financial Resource Strain: Low Risk (12/21/2022)   Overall Financial Resource Strain (CARDIA)    Difficulty of Paying Living Expenses: Not hard at all  Food Insecurity: No Food Insecurity (02/23/2024)   Epic    Worried About Programme Researcher, Broadcasting/film/video in the Last Year: Never true    Ran Out of Food in the Last Year: Never true  Transportation Needs: No Transportation Needs (02/23/2024)   Epic    Lack of Transportation (Medical): No    Lack of Transportation (Non-Medical): No  Physical Activity: Inactive (12/21/2022)   Exercise Vital Sign    Days of Exercise per Week: 0 days    Minutes of Exercise per Session: 0 min  Stress: No Stress Concern Present (12/21/2022)   [employer] of Occupational Health - Occupational Stress Questionnaire    Feeling of Stress : Not at all  Social Connections: Socially Isolated (02/23/2024)   Social Connection and Isolation Panel    Frequency of Communication with Friends and Family: More than three times a week    Frequency of Social Gatherings with Friends and Family: More than three times a week    Attends Religious Services: Never    Database Administrator or Organizations: No    Attends Banker Meetings: Never    Marital  Status: Widowed  Intimate Partner Violence: Not At Risk (02/23/2024)   Epic    Fear of Current or Ex-Partner: No    Emotionally Abused: No    Physically Abused: No    Sexually Abused: No  Depression (PHQ2-9): Low Risk (02/11/2024)   Depression (PHQ2-9)    PHQ-2 Score: 0  Alcohol  Screen: Low Risk (12/21/2022)   Alcohol  Screen    Last Alcohol  Screening Score (AUDIT): 4  Housing: Low Risk (02/23/2024)   Epic    Unable to Pay for Housing in the Last Year: No    Number of Times Moved in the Last Year: 0    Homeless in the Last Year: No  Utilities: Not At Risk (02/23/2024)   Epic    Threatened with loss of utilities: No  Health Literacy: Adequate Health Literacy (12/21/2022)   B1300 Health Literacy    Frequency of need for help with medical instructions: Never     FAMILY HISTORY Family History  Problem Relation Age of Onset   Cancer Mother    Depression Mother    Early death Mother    Cancer Father    Depression Father    Early death Father      Review of Systems: 12 systems reviewed Otherwise as per HPI, all other systems reviewed and negative  Physical Exam: Vitals:  03/10/24 0437 03/10/24 0938  BP: 116/60 (!) 137/118  Pulse: 85 84  Resp: 20 16  Temp: 97.7 F (36.5 C) 98.3 F (36.8 C)  SpO2: 96% 96%   No intake/output data recorded.  Intake/Output Summary (Last 24 hours) at 03/10/2024 1217 Last data filed at 03/10/2024 0439 Gross per 24 hour  Intake 0 ml  Output 600 ml  Net -600 ml   General: NAD, ill appearing HEENT: anicteric sclera, oropharynx clear without lesions, dry MM CV: regular rate, normal rhythm, no murmurs, no gallops, no rubs, no peripheral edema Lungs: clear to auscultation bilaterally, normal work of breathing Abd: soft, non-tender, non-distended Skin: no visible lesions or rashes Musculoskeletal: no obvious deformities Neuro: awake but not following any commands, intermittent myoclonic jerks GU: +foley with dark/amber urine  Test  Results Reviewed Lab Results  Component Value Date   NA 141 03/10/2024   K 3.4 (L) 03/10/2024   CL 108 03/10/2024   CO2 19 (L) 03/10/2024   BUN 42 (H) 03/10/2024   CREATININE 2.60 (H) 03/10/2024   GFR 63.15 07/17/2022   CALCIUM  8.5 (L) 03/10/2024   ALBUMIN  2.7 (L) 03/10/2024   PHOS 4.3 11/17/2021     I have reviewed all relevant outside healthcare records related to the patient's kidney injury.

## 2024-03-10 NOTE — Progress Notes (Signed)
 " PROGRESS NOTE    Charles Lloyd Charles Lloyd.  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 He was found to have a 3.5 x 2.4 cm L psoas muscle abscess. Neurosurgery recommended IR drainage and IV antibiotics.  Underwent CT-guided aspiration on 02/24/24. Developed hospital-associated delirium requiring ICU transfer, now improved.  Currently on daptomycin  and cefepime  till 04/13/2024 as per ID recommendations.  Psychiatry consulted on 03/06/2024 for intermittent agitation and as per family request.  Palliative care also consulted for goals of care discussion.  Assessment & Plan:   Left psoas abscess -Had recent transverse lumbar interbody fusion on 02/06/2024. -S/p IR CT guided aspiration of left psoas fluid collection, drain placed.  No growth on fluid and blood cultures - Currently on daptomycin  and cefepime  till 04/13/2024 as per ID recommendations.  Currently has a PICC line. -Awaiting placement.  TOC following.  Denied by insurance for CIR placement.  Delirium Alcohol  withdrawal syndrome with delirium Possible underlying dementia -MRI brain unremarkable -Off precedex  drip.  Completed phenobarbital  taper - Continue delirium precautions and frequent reorientation. - Avoid Ativan : Son was concerned about paradoxical response to Ativan .  Currently added as an allergy -Psychiatry consulted on 03/06/2024 for intermittent agitation and as per family request.  Patient was started on Rozerem  by psychiatry.  Patient subsequently has been very sleepy with intermittent tremors with poor low oral intake.  Family concerned about the same.  Rozerem  has been discontinued by psychiatry.  Monitor mental status. - Discussed with son on phone and daughter at bedside in detail: They state that his mental status is  slightly improving this morning: He apparently opened his eyes and squeezed his daughter's hands.  They would like to keep monitoring his mental status today: If his mental status does not improve by tomorrow, might have to consider EEG and MRI of the brain.  Son wants to avoid MRI as much as possible as patient might need more sedation to get it done. - Gabapentin  has already been discontinued.  Paroxysmal A-fib - Currently rate controlled.  Continue flecainide , Coreg  and Eliquis   Chronic diastolic heart failure Hypertension hyperlipidemia - Compensated.  Continue strict input and output.  Daily weights.  Continue hydralazine , Coreg .  Torsemide  resumed on 03/04/2024.  But held on 03/07/2024 because of increasing creatinine.  Outpatient follow-up with cardiology - Continue statin  AKI - Possibly from poor oral intake recently.  Creatinine worsening to 2.6 today.  Continue IV fluids. -Renal ultrasound negative for hydronephrosis or stones.  Monitor.  Diuretic plan as above. - Nephrology consulted today  Acute metabolic acidosis -Monitor  Hypokalemia - Mild.  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  Blood sugars currently stable.  Continue CBGs with SSI.  Will not continue insulin  at discharge.  Continue gabapentin   Asthma - No exacerbation.  Continue montelukast   Recurrent falls -Awaiting rehab placement  Wound 02/24/24 1245 Pressure Injury Buttocks Mid;Lower Deep Tissue Pressure Injury - Purple or maroon localized area of discolored intact skin or blood-filled blister due to damage of underlying soft tissue from pressure and/or shear. (Active)     Wound 02/24/24 1245 Pressure Injury Scrotum Circumferential Deep Tissue Pressure Injury - Purple or maroon localized area of discolored intact skin or blood-filled blister due to damage of underlying soft tissue from  pressure and/or shear. (Active)  Present to admission: Continue local wound  care  Acute on chronic urinary retention - Failed voiding trial on 03/04/2024 and Foley catheter had to be replaced on 03/04/2024.  Continue Flomax .  Outpatient follow-up with urology  Hyponatremia - Resolved. Sodium improved to 141 today.  Diuretic plan as above.  DC salt tabs. Monitor  Hypomagnesemia - Improved  Anemia of chronic disease - From chronic illnesses.  Hemoglobin stable.  Monitor intermittently  Thrombocytopenia - Resolved  Goals of care - Overall prognosis is guarded to poor.  Palliative care evaluation appreciated.  Patient remains full code.  DVT prophylaxis: Eliquis  Code Status: Full Family Communication: Son on phone.  Daughter at bedside. disposition Plan: Status is: Inpatient Remains inpatient appropriate because: Of severity of illness.  Need for SNF placement    Consultants: Neurosurgery/IR/PCCM/ID.  Psychiatry.  palliative care.  Consult nephrology  Procedures: As above  Antimicrobials:  Anti-infectives (From admission, onward)    Start     Dose/Rate Route Frequency Ordered Stop   03/10/24 1400  DAPTOmycin  (CUBICIN ) 600 mg in sodium chloride  0.9 % IVPB        8 mg/kg  79.3 kg 124 mL/hr over 30 Minutes Intravenous Every 48 hours 03/09/24 1013     03/10/24 1000  ceFEPIme  (MAXIPIME ) 2 g in sodium chloride  0.9 % 100 mL IVPB        2 g 200 mL/hr over 30 Minutes Intravenous Every 24 hours 03/09/24 1013     03/07/24 1800  ceFEPIme  (MAXIPIME ) 2 g in sodium chloride  0.9 % 100 mL IVPB  Status:  Discontinued        2 g 200 mL/hr over 30 Minutes Intravenous Every 12 hours 03/07/24 1118 03/09/24 1013   02/28/24 2200  ceFEPIme  (MAXIPIME ) 2 g in sodium chloride  0.9 % 100 mL IVPB  Status:  Discontinued        2 g 200 mL/hr over 30 Minutes Intravenous Every 8 hours 02/28/24 1440 03/07/24 1118   02/28/24 1530  DAPTOmycin  (CUBICIN ) 600 mg in sodium chloride  0.9 % IVPB  Status:  Discontinued        8 mg/kg  79.3 kg 124 mL/hr over 30 Minutes Intravenous Daily  02/28/24 1440 03/09/24 1013   02/28/24 0000  daptomycin  (CUBICIN ) IVPB  Status:  Discontinued        600 mg Intravenous Every 24 hours 02/28/24 1444 03/09/24    02/28/24 0000  ceFEPime  (MAXIPIME ) IVPB  Status:  Discontinued        2 g Intravenous Every 8 hours 02/28/24 1444 03/09/24    02/23/24 2200  vancomycin  (VANCOREADY) IVPB 1500 mg/300 mL  Status:  Discontinued        1,500 mg 150 mL/hr over 120 Minutes Intravenous Every 24 hours 02/22/24 2348 02/23/24 0242   02/23/24 1000  linezolid  (ZYVOX ) IVPB 600 mg  Status:  Discontinued       Note to Pharmacy: Pt developed rash shortly after vancomycin  administration.   600 mg 300 mL/hr over 60 Minutes Intravenous Every 12 hours 02/23/24 0242 02/28/24 1440   02/23/24 0800  piperacillin -tazobactam (ZOSYN ) IVPB 3.375 g  Status:  Discontinued        3.375 g 12.5 mL/hr over 240 Minutes Intravenous Every 8 hours 02/22/24 2348 02/28/24 1440   02/22/24 2315  piperacillin -tazobactam (ZOSYN ) IVPB 3.375 g        3.375 g 100 mL/hr over 30 Minutes Intravenous  Once 02/22/24 2306 02/23/24 0013   02/22/24 2315  vancomycin  (VANCOREADY)  IVPB 1500 mg/300 mL        1,500 mg 150 mL/hr over 120 Minutes Intravenous  Once 02/22/24 2306 02/23/24 0141        Subjective: Patient seen and examined at bedside.  Poor historian.  No agitation, fever, vomiting reported.   Objective: Vitals:   03/09/24 0629 03/09/24 1424 03/09/24 2121 03/10/24 0437  BP: 97/83 135/67 (!) 142/52 116/60  Pulse: 88 90 91 85  Resp: 18 18 20 20   Temp: 98.3 F (36.8 C) 98.5 F (36.9 C) 98.8 F (37.1 C) 97.7 F (36.5 C)  TempSrc: Oral Oral Oral Oral  SpO2: 96% 94% 100% 96%  Weight:      Height:        Intake/Output Summary (Last 24 hours) at 03/10/2024 0810 Last data filed at 03/10/2024 0439 Gross per 24 hour  Intake 0 ml  Output 600 ml  Net -600 ml   Filed Weights   02/22/24 1238 02/24/24 1245  Weight: 79.4 kg 79.3 kg    Examination:  General: No acute distress.   Remains on room air.  Elderly male lying in bed. ENT/neck: No obvious neck masses or JVD elevation noted  respiratory: Decreased breath sounds at bases bilaterally with scattered crackles CVS: Rate currently controlled; S1 and S2 are heard  abdominal: Soft, nontender, still distended; no organomegaly, bowel sounds are heard normally  extremities: No clubbing; mild lower extremity edema present CNS: Extremely slow to respond.  Poor historian.  No obvious focal deficit.  Continues to have intermittent upper extremity tremors. lymph: No lymphadenopathy palpable  skin: No obvious petechiae/rashes psych: Showing no signs of agitation.  Affect is mostly flat  genitourinary: Has Foley catheter  musculoskeletal: No obvious joint tenderness/erythema  Data Reviewed: I have personally reviewed following labs and imaging studies  CBC: Recent Labs  Lab 03/03/24 0950 03/04/24 0408 03/05/24 0306 03/07/24 0350 03/10/24 0352  WBC 4.7 4.3 4.4 6.1 9.7  NEUTROABS  --   --  2.3 3.4 7.3  HGB 9.2* 8.1* 9.2* 9.4* 8.3*  HCT 26.8* 24.5* 26.7* 27.6* 25.5*  MCV 86.7 88.1 85.9 86.8 90.7  PLT 90* 80* 95* 129* 171   Basic Metabolic Panel: Recent Labs  Lab 03/06/24 0434 03/07/24 0350 03/08/24 0304 03/09/24 0304 03/10/24 0352  NA 130* 131* 133* 137 141  K 3.2* 3.4* 3.6 3.6 3.4*  CL 97* 95* 97* 102 108  CO2 24 24 23  20* 19*  GLUCOSE 105* 114* 109* 125* 113*  BUN 12 14 19  32* 42*  CREATININE 1.20 1.31* 1.58* 2.33* 2.60*  CALCIUM  9.2 9.8 9.6 8.7* 8.5*  MG 1.7 1.8 1.9 1.8 1.8   GFR: Estimated Creatinine Clearance: 21.6 mL/min (A) (by C-G formula based on SCr of 2.6 mg/dL (H)). Liver Function Tests: Recent Labs  Lab 03/09/24 0304 03/10/24 0352  AST 36 104*  ALT 13 26  ALKPHOS 103 109  BILITOT 0.8 0.7  PROT 5.7* 5.4*  ALBUMIN  2.8* 2.7*   No results for input(s): LIPASE, AMYLASE in the last 168 hours. No results for input(s): AMMONIA in the last 168 hours. Coagulation Profile: No  results for input(s): INR, PROTIME in the last 168 hours. Cardiac Enzymes: Recent Labs  Lab 03/09/24 0304  CKTOTAL 190    BNP (last 3 results) No results for input(s): PROBNP in the last 8760 hours. HbA1C: No results for input(s): HGBA1C in the last 72 hours. CBG: Recent Labs  Lab 03/09/24 0747 03/09/24 1136 03/09/24 1646 03/09/24 2119 03/10/24 0749  GLUCAP 125*  121* 119* 137* 101*   Lipid Profile: No results for input(s): CHOL, HDL, LDLCALC, TRIG, CHOLHDL, LDLDIRECT in the last 72 hours. Thyroid  Function Tests: No results for input(s): TSH, T4TOTAL, FREET4, T3FREE, THYROIDAB in the last 72 hours. Anemia Panel: No results for input(s): VITAMINB12, FOLATE, FERRITIN, TIBC, IRON, RETICCTPCT in the last 72 hours. Sepsis Labs: No results for input(s): PROCALCITON, LATICACIDVEN in the last 168 hours.  No results found for this or any previous visit (from the past 240 hours).        Radiology Studies: US  RENAL Result Date: 03/09/2024 CLINICAL DATA:  Acute kidney injury. EXAM: RENAL / URINARY TRACT ULTRASOUND COMPLETE COMPARISON:  12/30/2020 FINDINGS: Right Kidney: Renal measurements: 9.0 x 5.7 x 4.9 cm = volume: 132 mL. Echogenicity within normal limits. No mass or hydronephrosis visualized. Left Kidney: Renal measurements: 10.3 x 5.5 x 4.6 cm = volume: 136 mL. Echogenicity within normal limits. No mass or hydronephrosis visualized. Bladder: Bladder is decompressed with a Foley catheter. Other: Anechoic structure in the right hepatic lobe near the right kidney. This is most compatible with a cyst and measures 1.7 x 1.1 x 1.4 cm. IMPRESSION: 1. Normal appearance of both kidneys.  No hydronephrosis. 2. Bladder is decompressed with a Foley catheter. Electronically Signed   By: Juliene Balder M.D.   On: 03/09/2024 13:44        Scheduled Meds:  allopurinol   100 mg Oral Daily   apixaban   2.5 mg Oral BID   atorvastatin   20 mg Oral QHS    carvedilol   3.125 mg Oral BID   Chlorhexidine  Gluconate Cloth  6 each Topical Daily   cycloSPORINE   1 drop Both Eyes Daily   flecainide   50 mg Oral BID   folic acid   1 mg Oral Daily   hydrALAZINE   25 mg Oral BID   hydrocortisone  cream   Topical TID   insulin  aspart  0-6 Units Subcutaneous TID AC & HS   montelukast   10 mg Oral QHS   sodium chloride  flush  3 mL Intravenous Q12H   sodium chloride   1 g Oral TID WC   tamsulosin   0.4 mg Oral QPC supper   thiamine   100 mg Oral Daily   Continuous Infusions:  sodium chloride  125 mL/hr at 03/10/24 9370   ceFEPime  (MAXIPIME ) IV     DAPTOmycin             Sophie Mao, MD [facility] 03/10/2024, 8:10 AM   "

## 2024-03-10 NOTE — Progress Notes (Signed)
 "        [facility]  Date of Admission:  02/22/2024     Principal Problem:   Psoas abscess, left (HCC) Active Problems:   Paroxysmal A-fib (HCC)   Chronic heart failure with preserved ejection fraction (HFpEF) (HCC)   NIDDM-2 with polyneuropathy and hyperglycemia   Hypercholesterolemia   Hyponatremia   Asthma   Essential hypertension   Recurrent falls   Altered mental status   Alcohol  withdrawal syndrome, with delirium (HCC)   Delirium   Pressure injury   Acute on chronic urinary retention   Stress reaction causing mixed disturbance of emotion and conduct          Assessment: 84 yo male 12/21 s/p lumbar l4-5 fusion, course with with left psoas abscess as well   Admission 12/27 due to fall/pain no fever. Mri showed left psoas abscess. Given abx. No fever but then developed on on 12/29 and ir aspirated abscess same day --> cx ngtd   Surgical site no dehiscence or cellulitis and mri doesn't show any surgical site fluid collection   Leakage of surgical site spine into psoas is not uncommon in hematogenous spread of infection. But agree that this is rather strange when the surgical site looks great        Planning to treat as involved l4-5 site infection/hardware complicated.  OPAT orders placed for daptomycin  and cefepime  for 6 weeks EOT 04/10/2024.  ID reengaged given concern for AIN and encephalopathy Nephrology following noted cannot rule out AIN in the context of cefepime .  Patient's serum creatinine went  from 0.75 on 1/7-2.6 on 03/10/2024 #Psoas abscess status post aspiration, cultures no growth #AKI in the setting of below #Broad-spectrum antibiotics with Dapto cefepime  #Delirium/ADL withdrawals Recommendations: -Rising creatinine since 1/8.  Daptomycin  and cefepime  started on 1/2.  Of the 2 antibiotics cefepime  is more likely to cause AIN, myoclonus can have occurred with both although much more rare with daptomycin .  At this point would  stop cefepime  and start ciprofloxacin  and monitor renal function.  There is some concern with altered mentation with fluoroquinolones but given that patient has multifactorial reason for encephalopathy including alcohol  withdrawal/delirium would recommend monitoring.  Unfortunately no great options for beta-lactam alternatives in this case given AKI with Bactrim , carbapenems (class of beta-lactam), aztreonam (monobactam).  Relayed to primary plan to switch to fqn - Of note home torsemide  was started 1/7 and continued till 1/9. - Follow nephrology plan -If mentation does not improve consider repeat MRI brain  Microbiology:   Antibiotics: Daptomycin  1/2-present Cefepime  1/2-present pip-tazo 12/27 - 1/1 Linezolid  12/27 - 1/2 Blood  Urine  Other   SUBJECTIVE: Resting in bed. Interval: Afebrile overnight.  Review of Systems: Review of Systems  All other systems reviewed and are negative.    Scheduled Meds:  allopurinol   100 mg Oral Daily   apixaban   2.5 mg Oral BID   atorvastatin   20 mg Oral QHS   carvedilol   3.125 mg Oral BID   Chlorhexidine  Gluconate Cloth  6 each Topical Daily   cycloSPORINE   1 drop Both Eyes Daily   flecainide   50 mg Oral BID   folic acid   1 mg Oral Daily   hydrALAZINE   25 mg Oral BID   hydrocortisone  cream   Topical TID   insulin  aspart  0-6 Units Subcutaneous TID AC & HS   montelukast   10 mg Oral QHS   sodium chloride  flush  3 mL Intravenous Q12H   tamsulosin   0.4  mg Oral QPC supper   thiamine   100 mg Oral Daily   Continuous Infusions:  [START ON 03/11/2024] ciprofloxacin      DAPTOmycin  Stopped (03/10/24 1515)   lactated ringers  100 mL/hr at 03/10/24 1618   PRN Meds:.acetaminophen  **OR** acetaminophen , bisacodyl , lip balm, liver oil-zinc  oxide, mouth rinse, oxybutynin , senna-docusate, sodium chloride  flush, sodium chloride  flush Allergies[1]  OBJECTIVE: Vitals:   03/09/24 2121 03/10/24 0437 03/10/24 0938 03/10/24 1501  BP: (!) 142/52 116/60 (!)  137/118 (!) 130/97  Pulse: 91 85 84 80  Resp: 20 20 16 16   Temp: 98.8 F (37.1 C) 97.7 F (36.5 C) 98.3 F (36.8 C) 97.8 F (36.6 C)  TempSrc: Oral Oral Oral Oral  SpO2: 100% 96% 96% 98%  Weight:      Height:       Body mass index is 26.58 kg/m.  Physical Exam Constitutional:      General: He is not in acute distress.    Appearance: He is not toxic-appearing.  HENT:     Head: Normocephalic and atraumatic.     Right Ear: External ear normal.     Left Ear: External ear normal.     Nose: No congestion or rhinorrhea.     Mouth/Throat:     Mouth: Mucous membranes are moist.     Pharynx: Oropharynx is clear.  Eyes:     Extraocular Movements: Extraocular movements intact.     Conjunctiva/sclera: Conjunctivae normal.     Pupils: Pupils are equal, round, and reactive to light.  Cardiovascular:     Rate and Rhythm: Normal rate and regular rhythm.     Heart sounds: No murmur heard.    No friction rub. No gallop.  Pulmonary:     Effort: Pulmonary effort is normal.     Breath sounds: Normal breath sounds.  Abdominal:     General: Abdomen is flat. Bowel sounds are normal.     Palpations: Abdomen is soft.  Musculoskeletal:        General: No swelling.     Cervical back: Normal range of motion and neck supple.  Psychiatric:        Mood and Affect: Mood normal.       Lab Results Lab Results  Component Value Date   WBC 9.7 03/10/2024   HGB 8.3 (L) 03/10/2024   HCT 25.5 (L) 03/10/2024   MCV 90.7 03/10/2024   PLT 171 03/10/2024    Lab Results  Component Value Date   CREATININE 2.60 (H) 03/10/2024   BUN 42 (H) 03/10/2024   NA 141 03/10/2024   K 3.4 (L) 03/10/2024   CL 108 03/10/2024   CO2 19 (L) 03/10/2024    Lab Results  Component Value Date   ALT 26 03/10/2024   AST 104 (H) 03/10/2024   ALKPHOS 109 03/10/2024   BILITOT 0.7 03/10/2024        Loney Stank, MD [facility] 03/10/2024, 4:19 PM    Evaluation of this patient requires complex antimicrobial therapy evaluation and counseling + isolation needs for disease transmission risk assessment and mitigation      [1]  Allergies Allergen Reactions   Amlodipine  Nausea Only, Swelling and Other (See Comments)    Severe edema and chest pain- also   Ativan  [Lorazepam ] Other (See Comments)    Made the patient fidgety and he hallucinates   Chlorhexidine  Gluconate Itching and Other (See Comments)    Only itching with wipes, not with soap   "

## 2024-03-10 NOTE — Progress Notes (Signed)
Urine sample recollected

## 2024-03-10 NOTE — Consult Note (Cosign Needed)
" °  The patient was initially seen following placement of a psychiatry consult for agitation, depressive symptoms, and family request for psychiatric evaluation. During the hospital course, the patient experienced paradoxical reactions to the psychotropic medications that were trialed; as a result, these medications were subsequently discontinued.  At this time, the patient has been off all psychotropic medications for approximately two days. Despite medication discontinuation, the patient continues to demonstrate an abnormal mental status, characterized by eye opening without purposeful response or ability to follow commands. His current presentation does not appear consistent with an acute primary psychiatric process.  Palliative Medicine is following the patient closely. The primary medical team plans to pursue further neurologic evaluation, including EEG and MRI of the brain, should there be no clinical improvement. Given the persistence of altered mental status despite cessation of psychotropic medications, a non-psychiatric etiology is strongly suspected.  Psychiatry was involved in this case primarily due to family request. At this time, there is no clear indication for ongoing psychiatric intervention. Psychiatry consult service will sign off. Please reconsult as needed should additional questions or concerns arise. "

## 2024-03-10 NOTE — Progress Notes (Signed)
 Attempted to administer PO medications, Pt appears to experience difficulty when swallowing. Medications were crushed and pt holds in his mouth. Provider updated.

## 2024-03-10 NOTE — Evaluation (Signed)
 Clinical/Bedside Swallow Evaluation Patient Details  Name: Charles Iden. MRN: [id number] Date of Birth: 09-26-1940  Today's Date: 03/10/2024 Time: SLP Start Time (ACUTE ONLY): 1415 SLP Stop Time (ACUTE ONLY): 1427 SLP Time Calculation (min) (ACUTE ONLY): 12 min  Past Medical History:  Past Medical History:  Diagnosis Date   Acute tracheobronchitis 01/05/2019   B12 deficiency    Basal cell carcinoma (BCC) of left side of nose 01/28/2018   Cancer (HCC)    Cardiomyopathy (HCC) 03/10/2018   With chronic atrial fibrillation   CHF (congestive heart failure) (HCC) 04/16/2013   Functional class II, ejection fraction 35-40%  Formatting of this note might be different from the original. Functional class II, ejection fraction 35-40%   Chronic anticoagulation    Chronic diastolic (congestive) heart failure (HCC) 04/16/2013   Functional class II, ejection fraction 35-40%  Last Assessment & Plan:  Clinically stable Volume well controlled meds reviewed   Chronic diastolic CHF (congestive heart failure) (HCC) 04/16/2013   Functional class II, ejection fraction 35-40%  Last Assessment & Plan:  Clinically stable Volume well controlled meds reviewed   Colon polyps 04/16/2013   Coronary artery disease involving native coronary artery of native heart without angina pectoris 04/05/2022   Myoview  05/25/2019: EF 60, normal perfusion, low risk CCTA (AF protocol) 04/25/20: CAC score 4242 (99th percentile)   Diabetic neuropathy (HCC)    Difficult intubation    pt reports difficult intubation during surgery in [place] in 2015   Dysrhythmia    A.Fib   Eosinophil count raised 02/17/2019   Essential hypertension 01/06/2010   Last Assessment & Plan:  Well controlled Continue med management   Gout    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  Steroid usage was discontinued several months ago.  Follow up with Dermatology as planned.    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   Last Assessment & Plan:  Id following ?ongoing abx managementy reported Request records   Infection of prosthetic right knee joint    Insomnia 03/04/2012   Grief with loss of wife 02/20/12  Last Assessment & Plan:  Improved sx  contineu xanax prn Se discussed   Lesion of liver 04/20/2019   -On cardiac CT 02/2019   Mild reactive airways disease    Neoplasm of prostate 04/16/2013   Neoplasm of prostate, 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 Transthoracic Echocardiogram-03/10/2013-[facility]: Normal left ventricular wall thickness and cavity size.  Global left ventricular systolic function is moderately reduced.  The estimated ejection fraction is 35-40%.  The left atrium is moderately enlarged.  The right atrium is mildly enlarged.  No significant valvular    Overweight (BMI 25.0-29.9) 10/22/2016   Persistent atrial fibrillation (HCC) 04/16/2013   Last Assessment & Plan:  Rate controlled Continue med management eliquis  for stroke preventino  Formatting of this note might be different  from the original.  Drug  HX Current Rx Pre-ABL inefficacy Pre-ABL intolerant Post-ABL inefficacy Post-ABL intolerant max dose/24h M/Y end comments  sotalol                  dofetilide                  flecainide                   propafenone                   am   S/P TKR (total knee replacement), bilateral    Secondary hypercoagulable state 04/09/2019   Tinea cruris 04/16/2013   Type 2 diabetes mellitus with diabetic polyneuropathy, without long-term current use of insulin  (HCC) 04/16/2013   Last Assessment & Plan:  Labs today Pt reports well controlled on ambulatory monitoring   Vitamin D  deficiency 07/25/2018   Past Surgical History:  Past Surgical History:  Procedure Laterality Date   ANTERIOR LAT LUMBAR FUSION N/A 02/06/2024   Procedure: ANTERIOR PRONE LATERAL LUMBAR FUSION LUMBAR FOUR-LUMBAR FIVE;  Surgeon: Darnella Dorn SAUNDERS, MD;  Location: [facility] OR;  Service: Neurosurgery;  Laterality: N/A;   ATRIAL FIBRILLATION ABLATION N/A 03/12/2019   Procedure: ATRIAL FIBRILLATION ABLATION;  Surgeon: Inocencio Soyla Lunger, MD;  Location: [facility] INVASIVE CV LAB;  Service: Cardiovascular;  Laterality: N/A;   ATRIAL FIBRILLATION ABLATION N/A 04/29/2020   Procedure: ATRIAL FIBRILLATION ABLATION;  Surgeon: Inocencio Soyla Lunger, MD;  Location: [facility] INVASIVE CV LAB;  Service: Cardiovascular;  Laterality: N/A;   CARDIAC ELECTROPHYSIOLOGY STUDY AND ABLATION     CARDIOVERSION     CARDIOVERSION N/A 10/10/2018   Procedure: CARDIOVERSION;  Surgeon: Delford Maude BROCKS, MD;  Location: [facility] ENDOSCOPY;  Service: Cardiovascular;  Laterality: N/A;   CARDIOVERSION N/A 05/18/2020   Procedure: CARDIOVERSION;  Surgeon: Alveta Aleene PARAS, MD;  Location: [facility] ENDOSCOPY;  Service: Cardiovascular;  Laterality: N/A;   CARDIOVERSION N/A 12/09/2020   Procedure: CARDIOVERSION;  Surgeon: Pietro Redell RAMAN, MD;  Location: [facility] ENDOSCOPY;  Service: Cardiovascular;  Laterality: N/A;   CHOLECYSTECTOMY     PROSTATECTOMY     REPLACEMENT TOTAL KNEE BILATERAL     HPI:  Charles Metallo. is a 84 y.o. male who presented to the emergency department on 02/22/2024 for leg pain and weakness, with family reporting multiple falls over the past 6 to 8 months.  MRI lumbar spine revealed a new 3.5 x 2.4 cm fluid  collection in the left psoas muscle concerning for abscess.  Neurosurgery was consulted at that time and recommended IV antibiotics and possible aspiration of abscess. Recent change in MS precipitated swallowing consult. PMH HTN, hyperlipidemia, DM2, atrial fibrillation on anticoagulation, s/p recent L4-5 trans psoas lumbar interbody fusion left-sided approach on 02/06/2024, CHF,gout,CAD, TKA with infection, neuropathy    Assessment / Plan / Recommendation  Clinical Impression  PLAN: NPO until mental status improves.    Pt with eyes open but no purposeful responses. He did not speak nor follow any commands.  Presentation of ice/teaspoons of water were not recognized. There was no effort to manipulate material or seal lips around spoon. Liquids spilled passively from his lips without reaction.  Oral suctioning provided.  Evaluation ended given pt's inability to fully participate. He is not alert enough to eat/drink. D/W RN.  He is being followed by Palliative medicine and psych.  SLP will follow along for improvements in MS, readiness to resume POs.   SLP Visit Diagnosis: Dysphagia, unspecified (R13.10)  Aspiration Risk  Severe aspiration risk    Diet Recommendation   NPO  Medication Administration: Via alternative means     Frequency and Duration min 2x/week  1 week       Prognosis Prognosis for improved oropharyngeal function: Guarded Barriers to Reach Goals: Cognitive deficits      Swallow Study   General Date of Onset: 02/22/24 HPI: Charles Kise. is a 84 y.o. male who presented to the emergency department on 02/22/2024 for leg pain and weakness, with family reporting multiple falls over the past 6 to 8 months.  MRI lumbar spine revealed a new 3.5 x 2.4 cm fluid collection in the left psoas muscle concerning for abscess.  Neurosurgery was consulted at that time and recommended IV antibiotics and possible aspiration of abscess. Recent change in MS precipitated swallowing  consult. PMH HTN, hyperlipidemia, DM2, atrial fibrillation on anticoagulation, s/p recent L4-5 trans psoas lumbar interbody fusion left-sided approach on 02/06/2024, CHF,gout,CAD, TKA with infection, neuropathy Type of Study: Bedside Swallow Evaluation Previous Swallow Assessment: no Diet Prior to this Study: Regular;Thin liquids (Level 0) Temperature Spikes Noted: No Respiratory Status: Room air History of Recent Intubation: No Behavior/Cognition: Alert;Doesn't follow directions Oral Cavity Assessment: Other (comment) (unable to fully assess; holding secretions orally) Oral Care Completed by SLP: Yes Patient Positioning: Upright in bed Baseline Vocal Quality: Not observed Volitional Cough: Cognitively unable to elicit Volitional Swallow: Unable to elicit    Oral/Motor/Sensory Function Overall Oral Motor/Sensory Function:  (symmetric at baseline)   Ice Chips Ice chips: Impaired Oral Phase Impairments: Poor awareness of bolus Oral Phase Functional Implications: Oral holding   Thin Liquid Thin Liquid: Impaired Presentation: Spoon Oral Phase Impairments: Poor awareness of bolus Oral Phase Functional Implications: Right anterior spillage;Left anterior spillage    Nectar Thick Nectar Thick Liquid: Not tested   Honey Thick Honey Thick Liquid: Not tested   Puree Puree: Not tested   Solid     Solid: Not tested      Charles Lloyd 03/10/2024,3:36 PM  Charles L. Vona, MA CCC/SLP Clinical Specialist - Acute Care SLP Acute Rehabilitation Services Office number [phone]

## 2024-03-10 NOTE — TOC Progression Note (Addendum)
 Transition of Care (TOC) - Progression Note    Patient Details  Name: Charles Lloyd. MRN: [id number] Date of Birth: [birth year]  Transition of Care [facility]) CM/SW Contact  Sriman Tally, Nathanel, RN Phone Number: 03/10/2024, 10:30 AM  Clinical Narrative: Shara for [facility] rep Etta aware auth ended yesterday. Await closer to medical stability for auth again, also noted palliative care meeting-GOC-[name] will await his sister to be involved w/GOC meeting.Will await outcome. Nephrology cons.      Expected Discharge Plan: Skilled Nursing Facility Barriers to Discharge: Continued Medical Work up               Expected Discharge Plan and Services In-house Referral: NA Discharge Planning Services: CM Consult Post Acute Care Choice: Durable Medical Equipment, IP Rehab Living arrangements for the past 2 months: Single Family Home Expected Discharge Date: 03/01/24                                     Social Drivers of Health (SDOH) Interventions SDOH Screenings   Food Insecurity: No Food Insecurity (02/23/2024)  Housing: Low Risk (02/23/2024)  Transportation Needs: No Transportation Needs (02/23/2024)  Utilities: Not At Risk (02/23/2024)  Alcohol  Screen: Low Risk (12/21/2022)  Depression (PHQ2-9): Low Risk (02/11/2024)  Financial Resource Strain: Low Risk (12/21/2022)  Physical Activity: Inactive (12/21/2022)  Social Connections: Socially Isolated (02/23/2024)  Stress: No Stress Concern Present (12/21/2022)  Tobacco Use: Low Risk (02/22/2024)  Health Literacy: Adequate Health Literacy (12/21/2022)    Readmission Risk Interventions    02/26/2024    2:28 PM 08/08/2021   10:00 AM  Readmission Risk Prevention Plan  Transportation Screening Complete Complete  PCP or Specialist Appt within 3-5 Days Complete   HRI or Home Care Consult Complete   Social Work Consult for Recovery Care Planning/Counseling Complete   Palliative Care Screening Not Applicable    Medication Review Oceanographer) Complete Complete  PCP or Specialist appointment within 3-5 days of discharge  Complete  HRI or Home Care Consult  Complete  SW Recovery Care/Counseling Consult  Not Complete  Palliative Care Screening  Not Applicable  Skilled Nursing Facility  Not Applicable

## 2024-03-10 NOTE — Progress Notes (Addendum)
 "                                                                                   Progress note Date: 03/10/2024   Patient Name: Charles Lloyd.  DOB: 25-Oct-1940  MRN: [id number]  Age / Sex: 84 y.o., male  PCP: Theophilus Andrews, Tully GRADE, MD Referring Physician: Cheryle Page, MD  Reason for Consultation:  goals of care  HPI/Patient Profile: 84 y.o. male  with past medical history of recent lumbar surgery, HTN, hyperlipidemia, DM2, a-fib on anticoagulation, CHF, gout, CAD, neuropathy admitted on 02/22/2024 with leg pain and weakness. MRI revealed psoas abscess and he underwent aspiration of abscess on 12/29.  Admission has been complicated by altered mental status- initially altered after receiving lorazepam  for MRI on admission, but continued to have agitation. There was concern for alcohol  withdrawal vs benzo related delirium and he was transferred to the ICU, started on precedex  and completed phenobarbitol taper. Mental status returned to baseline around 12/31 and discharge planning was in process. CIR was recommended, but insurance denied. He developed some confusion around 1/8, and 1/9. He was started on Rozerem  by psych on 1/9 which has been discontinued. Palliative medicine initial consult completed 1/10. I am seeing patient today 1/13 for continued followup.    Primary Decision Maker NEXT OF KIN- son- Octavia Mottola III  Discussion: Per psychiatry notes- patient has been somnolent since 1/11 with some myoclonus noted.  Labs- Cr is rising 2.6 today, nephrology consulted- per nephrology note- likely due to poor po intake- recommend restart IV fluids and doing autoimmune workup. Per son patient has not had anything to eat or drink since Friday 1/9.  On evaluation patient was awake, but non verbal. He did not answer any of my questions. Per nurse he was unable to take po medications this morning, he held meds in his mouth.  Speech consulted- per note they recommended NPO status due to  his poor mental status.  I spoke with patient's son via phone.  Son expressed frustration with patient's progress and his feeling regarding lack of answers to why patient has had most recent mental status change. Son is concerned that alcohol  withdrawal diagnosis is inaccurate- notes patient has been hospitalized previously and did not have withdrawals.  Emotional support provided.  Phillips would like a follow-up meeting with Palliative now that his sister is in town.  Case discussed with Dr. Landry General.     SUMMARY OF RECOMMENDATIONS -Pt with waxing and waning hyper and hypoactive delirium during admission for psoas abscess- most recent change in mental status Friday/Saturday with somnolence and nonverbal state which is drastically different from presentation as of last week with no clear etiology. Has been presumed to be ETOH withdrawal and hospital delirium, however, I believe would benefit from further workup. Patient's son expressed desire for full scope care and is open to further diagnostics so that reasonable prognostication can be made.  -Discussed with Dr. General and plan made for delirium workup -Given patient's history of steroid use per son's report- check labs for adrenal insufficiency - CT brain for significant change in mental status -Thyroid  studies and other labs for hypoactive delirium  workup -Acetaminophen  1000mg  TID for possible unexpressed pain - I called patient's son again and reviewed above recommendations, he is in agreement and appreciative of plan -Also would consider EEG for subclinical seizure given altered mental status and myoclonus due to being on daptomycin  which lowers seizure threshold -Additional differential of cefepime  neurotoxicity -Plan to meet with son and daughter tomorrow at 1000    Code Status/Advance Care Planning:   Code Status: Full Code    Prognosis:   Unable to determine  Discharge Planning: To Be Determined  Primary  Diagnoses: Present on Admission:  Psoas abscess, left (HCC)  Paroxysmal A-fib (HCC)  NIDDM-2 with polyneuropathy and hyperglycemia  Hypercholesterolemia  Essential hypertension  Chronic heart failure with preserved ejection fraction (HFpEF) (HCC)  Asthma  Pressure injury  Acute on chronic urinary retention  Hyponatremia   Review of Systems  Unable to perform ROS   Physical Exam Vitals and nursing note reviewed.  Constitutional:      General: He is not in acute distress.    Appearance: He is ill-appearing.  Cardiovascular:     Rate and Rhythm: Normal rate.  Pulmonary:     Effort: Pulmonary effort is normal.  Neurological:     Comments: nonverbal     Vital Signs: BP (!) 130/97 (BP Location: Left Arm)   Pulse 80   Temp 97.8 F (36.6 C) (Oral)   Resp 16   Ht 5' 8 (1.727 m)   Wt 79.3 kg   SpO2 98%   BMI 26.58 kg/m  Pain Scale: PAINAD POSS *See Group Information*: 1-Acceptable,Awake and alert Pain Score: 0-No pain   SpO2: SpO2: 98 % O2 Device:SpO2: 98 % O2 Flow Rate: .O2 Flow Rate (L/min): 3 L/min  IO: Intake/output summary:  Intake/Output Summary (Last 24 hours) at 03/10/2024 1542 Last data filed at 03/10/2024 0439 Gross per 24 hour  Intake 0 ml  Output 600 ml  Net -600 ml    LBM: Last BM Date : 03/09/24 Baseline Weight: Weight: 79.4 kg Most recent weight: Weight: 79.3 kg       Thank you for this consult. Palliative medicine will continue to follow and assist as needed.  Time Total: 80 minutes Signed by: Cassondra Stain, AGNP-C Palliative Medicine  Time includes:   I personally spent a total of 80 minutes in the care of the patient today including preparing to see the patient, getting/reviewing separately obtained history, performing a medically appropriate exam/evaluation, counseling and educating, referring and communicating with other health care professionals, and documenting clinical information in the EHR.   Please contact Palliative Medicine Team  phone at [phone] for questions and concerns.  For individual provider: See Amion               "

## 2024-03-10 NOTE — Progress Notes (Signed)
 Physical Therapy Treatment Patient Details Name: Charles Lloyd. MRN: [id number] DOB: [birth year] Today's Date: 03/10/2024   History of Present Illness Charles Lloyd. is a 84 y.o. male who presented to the emergency department on 02/22/2024 for leg pain and weakness, with family reporting multiple falls over the past 6 to 8 months.  MRI lumbar spine revealed a new 3.5 x 2.4 cm fluid collection in the left psoas muscle concerning for abscess.  Neurosurgery was consulted at that time and recommended IV antibiotics and possible aspiration of abscess.  with significant PMH of hypertension, hyperlipidemia, type 2 diabetes, atrial fibrillation on anticoagulation, s/p recent L4-5 trans psoas lumbar interbody fusion left-sided approach on 02/06/2024, CHF,gout,CAD, TKA with infection, neuropathy.  1/9 AKI, delirium with possible ETOH withdrawals, and possible metabolic acidosis.     PT Comments   Pt admitted with above diagnosis.  Pt currently with functional limitations due to the deficits listed below (see PT Problem List). Pt exhibits a significant functional and cognitive decline from 1/9. Pt awake, eyes open and non verbal throughout PT intervention, pt unable to follow one step commands with multimodal cues and absent initiation of movement and self righting strategies. Extensive assist for bed mobility, sitting balance and trial with sit to stand  from EOB. Pt left in bed and all needs in place. Patient will benefit from continued inpatient follow up therapy, <3 hours/day.  Pt will benefit from acute skilled PT to increase their independence and safety with mobility to allow discharge.      If plan is discharge home, recommend the following: Assist for transportation;Help with stairs or ramp for entrance;Assistance with cooking/housework;A lot of help with bathing/dressing/bathroom;Two people to help with walking and/or transfers   Can travel by private vehicle     No  Equipment  Recommendations  None recommended by PT    Recommendations for Other Services Rehab consult     Precautions / Restrictions Precautions Precautions: Fall Recall of Precautions/Restrictions: Impaired Precaution/Restrictions Comments: trunk flexion at baseline Restrictions Weight Bearing Restrictions Per Provider Order: No     Mobility  Bed Mobility Overal bed mobility: Needs Assistance Bed Mobility: Supine to Sit, Sit to Supine     Supine to sit: Total assist, +2 for physical assistance, +2 for safety/equipment, HOB elevated Sit to supine: Total assist, +2 for physical assistance, +2 for safety/equipment   General bed mobility comments: pt demonstrated absent initiation of movement to assist to EOB with inabiltiy to follow one step commands, pt awake with eyes open and intermittently figditing with catheter    Transfers Overall transfer level: Needs assistance Equipment used: 2 person hand held assist Transfers: Sit to/from Stand Sit to Stand: Total assist, +2 physical assistance, +2 safety/equipment, From elevated surface           General transfer comment: PT and rehab tech unable to assist pt to upright standing today with 2 attempts pt exhibiting a strong posterior pushing response, maintained knee, hip and trunk flexion throuhout attempts with B LE instabiltiy and pt quickly fadinging into flexion and absent self recovery stratagies    Ambulation/Gait               General Gait Details: NT due to extensive assist for pt and staff safety   Stairs             Wheelchair Mobility     Tilt Bed    Modified Rankin (Stroke Patients Only)       Balance Overall balance  assessment: Needs assistance, History of Falls Sitting-balance support: Feet supported Sitting balance-Leahy Scale: Poor Sitting balance - Comments: mod  to min AA to maintain static sitting balance EOB posterior/anterior LOB                                     Communication Communication Communication: Impaired Factors Affecting Communication: Difficulty expressing self (no verbal communication)  Cognition Arousal: Alert Behavior During Therapy: WFL for tasks assessed/performed   PT - Cognitive impairments: Safety/Judgement, Sequencing, Problem solving                       PT - Cognition Comments: pt alert with eyes open but exhibited difficulty attending to task and following one step commands, pt nonverbal throughout intervention Following commands: Impaired Following commands impaired: Follows one step commands inconsistently    Cueing Cueing Techniques: Verbal cues, Gestural cues, Tactile cues, Visual cues  Exercises      General Comments        Pertinent Vitals/Pain Pain Assessment Pain Assessment: Faces (no apparent pain response nor report of pain) Faces Pain Scale: No hurt    Home Living                          Prior Function            PT Goals (current goals can now be found in the care plan section) Acute Rehab PT Goals Patient Stated Goal: To be able to manage at home alone. Progress towards PT goals: Not progressing toward goals - comment    Frequency    Min 3X/week      PT Plan      Co-evaluation              AM-PAC PT 6 Clicks Mobility   Outcome Measure  Help needed turning from your back to your side while in a flat bed without using bedrails?: Total Help needed moving from lying on your back to sitting on the side of a flat bed without using bedrails?: Total Help needed moving to and from a bed to a chair (including a wheelchair)?: Total Help needed standing up from a chair using your arms (e.g., wheelchair or bedside chair)?: Total Help needed to walk in hospital room?: Total   6 Click Score: 5    End of Session   Activity Tolerance: Other (comment) (cognition) Patient left: with call bell/phone within reach;in bed;with bed alarm set Nurse Communication:  Mobility status;Need for lift equipment PT Visit Diagnosis: Muscle weakness (generalized) (M62.81);Difficulty in walking, not elsewhere classified (R26.2)     Time: 8447-8394 PT Time Calculation (min) (ACUTE ONLY): 13 min  Charges:    $Therapeutic Activity: 8-22 mins PT General Charges $$ ACUTE PT VISIT: 1 Visit                     [place], PT Acute Rehab    [place] VEAR Drone 03/10/2024, 5:00 PM

## 2024-03-11 ENCOUNTER — Inpatient Hospital Stay (HOSPITAL_COMMUNITY)
Admit: 2024-03-11 | Discharge: 2024-03-11 | Disposition: A | Discharge status: Home / Self Care | Attending: Internal Medicine | Admitting: Internal Medicine

## 2024-03-11 DIAGNOSIS — R569 Unspecified convulsions: Secondary | ICD-10-CM | POA: Diagnosis not present

## 2024-03-11 DIAGNOSIS — J4530 Mild persistent asthma, uncomplicated: Secondary | ICD-10-CM | POA: Diagnosis not present

## 2024-03-11 DIAGNOSIS — R339 Retention of urine, unspecified: Secondary | ICD-10-CM

## 2024-03-11 DIAGNOSIS — G6289 Other specified polyneuropathies: Secondary | ICD-10-CM

## 2024-03-11 DIAGNOSIS — G629 Polyneuropathy, unspecified: Secondary | ICD-10-CM

## 2024-03-11 DIAGNOSIS — R471 Dysarthria and anarthria: Secondary | ICD-10-CM | POA: Diagnosis not present

## 2024-03-11 DIAGNOSIS — I10 Essential (primary) hypertension: Secondary | ICD-10-CM | POA: Diagnosis not present

## 2024-03-11 DIAGNOSIS — R296 Repeated falls: Secondary | ICD-10-CM | POA: Diagnosis not present

## 2024-03-11 DIAGNOSIS — I5032 Chronic diastolic (congestive) heart failure: Secondary | ICD-10-CM | POA: Diagnosis not present

## 2024-03-11 DIAGNOSIS — E1142 Type 2 diabetes mellitus with diabetic polyneuropathy: Secondary | ICD-10-CM | POA: Diagnosis not present

## 2024-03-11 DIAGNOSIS — K6812 Psoas muscle abscess: Secondary | ICD-10-CM | POA: Diagnosis not present

## 2024-03-11 DIAGNOSIS — Z515 Encounter for palliative care: Secondary | ICD-10-CM | POA: Diagnosis not present

## 2024-03-11 DIAGNOSIS — I480 Paroxysmal atrial fibrillation: Secondary | ICD-10-CM | POA: Diagnosis not present

## 2024-03-11 DIAGNOSIS — R410 Disorientation, unspecified: Secondary | ICD-10-CM | POA: Diagnosis not present

## 2024-03-11 DIAGNOSIS — E871 Hypo-osmolality and hyponatremia: Secondary | ICD-10-CM | POA: Diagnosis not present

## 2024-03-11 DIAGNOSIS — F10931 Alcohol use, unspecified with withdrawal delirium: Secondary | ICD-10-CM | POA: Diagnosis not present

## 2024-03-11 LAB — C4 COMPLEMENT: Complement C4, Body Fluid: 17 mg/dL (ref 12–38)

## 2024-03-11 LAB — COMPREHENSIVE METABOLIC PANEL WITH GFR
ALT: 47 U/L — ABNORMAL HIGH (ref 0–44)
AST: 169 U/L — ABNORMAL HIGH (ref 15–41)
Albumin: 2.6 g/dL — ABNORMAL LOW (ref 3.5–5.0)
Alkaline Phosphatase: 113 U/L (ref 38–126)
Anion gap: 15 (ref 5–15)
BUN: 41 mg/dL — ABNORMAL HIGH (ref 8–23)
CO2: 19 mmol/L — ABNORMAL LOW (ref 22–32)
Calcium: 8.8 mg/dL — ABNORMAL LOW (ref 8.9–10.3)
Chloride: 111 mmol/L (ref 98–111)
Creatinine, Ser: 2.39 mg/dL — ABNORMAL HIGH (ref 0.61–1.24)
GFR, Estimated: 27 mL/min — ABNORMAL LOW
Glucose, Bld: 89 mg/dL (ref 70–99)
Potassium: 3.1 mmol/L — ABNORMAL LOW (ref 3.5–5.1)
Sodium: 144 mmol/L (ref 135–145)
Total Bilirubin: 0.7 mg/dL (ref 0.0–1.2)
Total Protein: 5.2 g/dL — ABNORMAL LOW (ref 6.5–8.1)

## 2024-03-11 LAB — CBC WITH DIFFERENTIAL/PLATELET
Abs Immature Granulocytes: 0.03 K/uL (ref 0.00–0.07)
Basophils Absolute: 0 K/uL (ref 0.0–0.1)
Basophils Relative: 0 %
Eosinophils Absolute: 0.3 K/uL (ref 0.0–0.5)
Eosinophils Relative: 4 %
HCT: 23.5 % — ABNORMAL LOW (ref 39.0–52.0)
Hemoglobin: 7.7 g/dL — ABNORMAL LOW (ref 13.0–17.0)
Immature Granulocytes: 0 %
Lymphocytes Relative: 15 %
Lymphs Abs: 1.2 K/uL (ref 0.7–4.0)
MCH: 29.6 pg (ref 26.0–34.0)
MCHC: 32.8 g/dL (ref 30.0–36.0)
MCV: 90.4 fL (ref 80.0–100.0)
Monocytes Absolute: 0.7 K/uL (ref 0.1–1.0)
Monocytes Relative: 9 %
Neutro Abs: 5.5 K/uL (ref 1.7–7.7)
Neutrophils Relative %: 72 %
Platelets: 178 K/uL (ref 150–400)
RBC: 2.6 MIL/uL — ABNORMAL LOW (ref 4.22–5.81)
RDW: 18 % — ABNORMAL HIGH (ref 11.5–15.5)
WBC: 7.7 K/uL (ref 4.0–10.5)
nRBC: 0 % (ref 0.0–0.2)

## 2024-03-11 LAB — ANCA PROFILE
Anti-MPO Antibodies: <0.2 U (ref 0.0–0.9)
Anti-PR3 Antibodies: <0.2 U (ref 0.0–0.9)
Atypical P-ANCA titer: <1:20 {titer}
C-ANCA: <1:20 {titer}
P-ANCA: <1:20 {titer}

## 2024-03-11 LAB — C3 COMPLEMENT: C3 Complement: 114 mg/dL (ref 82–167)

## 2024-03-11 LAB — MAGNESIUM: Magnesium: 1.9 mg/dL (ref 1.7–2.4)

## 2024-03-11 LAB — KAPPA/LAMBDA LIGHT CHAINS
Kappa free light chain: 65.3 mg/L — ABNORMAL HIGH (ref 3.3–19.4)
Kappa, lambda light chain ratio: 1.02 (ref 0.26–1.65)
Lambda free light chains: 64.2 mg/L — ABNORMAL HIGH (ref 5.7–26.3)

## 2024-03-11 LAB — GLUCOSE, CAPILLARY
Glucose-Capillary: 85 mg/dL (ref 70–99)
Glucose-Capillary: 113 mg/dL — ABNORMAL HIGH (ref 70–99)
Glucose-Capillary: 93 mg/dL (ref 70–99)
Glucose-Capillary: 87 mg/dL (ref 70–99)

## 2024-03-11 LAB — MRSA NEXT GEN BY PCR, NASAL: MRSA by PCR Next Gen: NOT DETECTED

## 2024-03-11 LAB — CORTISOL-AM, BLOOD: Cortisol - AM: 9.6 ug/dL (ref 6.7–22.6)

## 2024-03-11 LAB — ANTI-DNA ANTIBODY, DOUBLE-STRANDED: ds DNA Ab: 3 [IU]/mL (ref 0–9)

## 2024-03-11 LAB — C-REACTIVE PROTEIN: CRP: 14.4 mg/dL — ABNORMAL HIGH

## 2024-03-11 LAB — AMMONIA: Ammonia: 19 umol/L (ref 9–35)

## 2024-03-11 MED ORDER — GABAPENTIN 300 MG PO CAPS: 600.0000 mg | ORAL_CAPSULE | Freq: Every day | ORAL | Status: DC

## 2024-03-11 MED ORDER — FOLIC ACID 5 MG/ML IJ SOLN
1.0000 mg | Freq: Every day | INTRAMUSCULAR | Status: DC
  Administered 2024-03-11 – 2024-03-13 (×3): 1 mg via INTRAVENOUS
  Filled 2024-03-11 (×4): qty 0.2

## 2024-03-11 MED ORDER — GABAPENTIN 300 MG PO CAPS: 300.0000 mg | ORAL_CAPSULE | ORAL | Status: DC

## 2024-03-11 MED ORDER — BISACODYL 10 MG RE SUPP
10.0000 mg | Freq: Once | RECTAL | Status: AC
  Administered 2024-03-11: 10 mg via RECTAL
  Filled 2024-03-11: qty 1

## 2024-03-11 MED ORDER — GABAPENTIN 300 MG PO CAPS
300.0000 mg | ORAL_CAPSULE | Freq: Two times a day (BID) | ORAL | Status: DC
  Filled 2024-03-11: qty 1

## 2024-03-11 MED ORDER — POTASSIUM CHLORIDE 10 MEQ/100ML IV SOLN
10.0000 meq | INTRAVENOUS | Status: AC
  Administered 2024-03-11 (×4): 10 meq via INTRAVENOUS
  Filled 2024-03-11 (×4): qty 100

## 2024-03-11 MED ORDER — LACTATED RINGERS IV SOLN: INTRAVENOUS | Status: AC

## 2024-03-11 NOTE — Progress Notes (Signed)
 " PROGRESS NOTE    Charles Lloyd.  FMW:[id number] DOB: [birth year] DOA: 02/22/2024 PCP: Theophilus Andrews, Tully GRADE, MD    Chief Complaint  Patient presents with   Fall    Brief Narrative:  84yo with h/o HTN, HLD, T2DM, afib on Eliquis , and L4-5 spondylolisthesis s/p fusion on 12/11 who presented with LLE weakness and pain with falls. He was found to have a 3.5 x 2.4 cm L psoas muscle abscess. Neurosurgery recommended IR drainage and IV antibiotics. Underwent CT-guided aspiration on 02/24/24. Developed hospital-associated delirium requiring ICU transfer, now improved. Currently on daptomycin  and cefepime  till 04/13/2024 as per ID recommendations. Psychiatry consulted on 03/06/2024 for intermittent agitation and as per family request. Palliative care also consulted for goals of care discussion.    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 - Patient seen in consultation by IR and patient underwent CT-guided aspiration of left psoas fluid collection with drain placement. - No growth noted on fluid and blood cultures. - Patient was seen in consultation by ID and on daptomycin  and cefepime  with end date scheduled for 04/13/2024 however due to AKI patient was reassessed by ID and due to concerns for cefepime  being more likely to cause AIN, and myoclonus cefepime  was discontinued  and ID recommended starting patient on ciprofloxacin . - ID following and appreciate input and recommendations.  2.  Acute metabolic encephalopathy/delirium/alcohol  withdrawal syndrome with delirium/possible underlying dementia -Patient initially required Precedex  drip which has been tapered off. - Status post completion of phenobarbital  taper. - MRI brain done on 02/24/2024 negative for any acute abnormalities. - Repeat CT head done 03/10/2024 negative for any acute intracranial abnormalities. - Patient with no respiratory symptoms. - Blood cultures obtained negative. - Ammonia level within normal limits at 19. - Vitamin B12 noted at 718. - TSH within normal limits at 2.510. - Folate level noted > 20. - EEG done today suggestive of generalized cerebral dysfunction likely related to toxic metabolic etiology.  No seizures or epileptiform discharges were seen throughout the recording. -It is noted that son concerned about paradoxical response to Ativan  and requesting Ativan  be avoided which has been added as an allergy. -Patient also seen in consultation by psychiatry 03/06/2024 for intermittent agitation as per family request.  Patient was started on Rozerem  per psychiatry and patient subsequently became very sleepy with intermittent tremors and poor low oral intake that was abnormal was discontinued by psychiatry. - Patient with some clinical improvement alert, following some commands however some dysarthria. -It is noted per Dr.Alekh that son wants to avoid MRI as much as possible as patient might need more sedation to get it done. -Gabapentin  was discontinued however family requesting gabapentin  be resumed. -Hold gabapentin  for another 24 hours and monitor -- Will monitor over the next 24 hours and if worsening symptoms or no significant improvement may need to repeat MRI brain. - Continue  treatment as in problem #1. - Palliative care following.  3.  Acute kidney injury -Patient noted to have  worsening renal function and fluid likely secondary to a prerenal azotemia in the setting of low BPs and concern for AIN in the setting of cefepime . -Renal ultrasound negative for hydronephrosis. - Patient seen in consultation by urology and cefepime  noted to have been discontinued 03/10/2024. - Nephrology recommending continuation of supportive care with IV fluids of LR at 100 cc/h for the next 24 hours. - Labs of ANCA, ANA, SPEP ordered per nephrology. - Nephrology following and appreciate the input and recommendations.  4.  Hyponatremia -Resolved.  5.  Metabolic acidosis -Likely secondary to AKI. - Follow.  6.  Paroxysmal atrial fibrillation -Rate controlled on Coreg , flecainide . - Eliquis  for anticoagulation.  7.  Chronic diastolic heart failure/hypertension/hyperlipidemia -Currently compensated. - Continue hydralazine , Coreg . - Torsemide  resumed on 03/04/2024 but held on 03/07/2024 due to increasing creatinine/AKI. - Outpatient follow-up with cardiology. - Continue statin.  8.  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 - CBG noted at 85. - SSI. - Gabapentin  on hold. - Would likely resume at half home dose in the next 24 hours if continued clinical improvement.  9. asthma -Stable. - Montelukast .  10.  Recurrent falls -PT/OT. - Likely needs SNF.  11.  Acute on chronic urinary retention -Patient noted to have failed voiding trial on 03/04/2024 and Foley catheter had to be placed on 03/04/2024. - Continue Flomax . - Outpatient follow-up with urology.  12.  Hypomagnesemia -Repleted.  13.  Hypokalemia -Replete.  14.  Anemia of chronic disease -Hemoglobin is stable.  15.  Thrombocytopenia -Resolved.   16. Pressure injury, Wound 02/24/24 1245 Pressure Injury Buttocks Mid;Lower Deep Tissue Pressure Injury - Purple or maroon localized area of discolored intact skin or blood-filled blister due to damage of underlying soft  tissue from pressure and/or shear. (Active)     Wound 02/24/24 1245 Pressure Injury Scrotum Circumferential Deep Tissue Pressure Injury - Purple or maroon localized area of discolored intact skin or blood-filled blister due to damage of underlying soft tissue from pressure and/or shear. (Active)       DVT prophylaxis: Eliquis  Code Status: DNR Family Communication: No family at bedside. Disposition: TBD  Status is: Inpatient Remains inpatient appropriate because: Severity of illness   Consultants:  Neurosurgery: Dr. Colon 02/23/2024 Interventional radiology: Dr. Jennefer 02/23/2024 PCCM Dr. Kara 02/24/2024 ID: Dr. Overton 02/28/2024 Psychiatry: Dr. Larina 03/06/2024 Palliative care Stephane Palin, NP 03/07/2024 Nephrology: Dr. Dennise 03/10/2024   Procedures:  CT pelvis 02/22/2024 CT L-spine 02/22/2024 CT head CT C-spine 02/22/2024 CT head 03/10/2024 Chest x-ray 02/22/2024 Plain films of the pelvis 02/22/2024 MRI brain 02/24/2024 MRI L-spine 02/22/2024 Renal ultrasound 02/28/2024 CT-guided aspiration of left psoas fluid collection per IR: Dr. Philip 02/24/2024 EEG 03/11/2024  Antimicrobials:  Anti-infectives (From admission, onward)    Start     Dose/Rate Route Frequency Ordered Stop   03/11/24 1000  ciprofloxacin  (CIPRO ) IVPB 400 mg        400 mg 200 mL/hr over 60 Minutes Intravenous Every 24 hours 03/10/24 1456     03/10/24 1400  DAPTOmycin  (CUBICIN ) 600 mg in sodium chloride  0.9 % IVPB        8 mg/kg  79.3 kg 124 mL/hr over 30 Minutes Intravenous Every 48 hours 03/09/24 1013     03/10/24 1000  ceFEPIme  (MAXIPIME ) 2 g in sodium chloride  0.9 % 100 mL IVPB  Status:  Discontinued  2 g 200 mL/hr over 30 Minutes Intravenous Every 24 hours 03/09/24 1013 03/10/24 1456   03/07/24 1800  ceFEPIme  (MAXIPIME ) 2 g in sodium chloride  0.9 % 100 mL IVPB  Status:  Discontinued        2 g 200 mL/hr over 30 Minutes Intravenous Every 12 hours 03/07/24 1118 03/09/24 1013   02/28/24 2200  ceFEPIme   (MAXIPIME ) 2 g in sodium chloride  0.9 % 100 mL IVPB  Status:  Discontinued        2 g 200 mL/hr over 30 Minutes Intravenous Every 8 hours 02/28/24 1440 03/07/24 1118   02/28/24 1530  DAPTOmycin  (CUBICIN ) 600 mg in sodium chloride  0.9 % IVPB  Status:  Discontinued        8 mg/kg  79.3 kg 124 mL/hr over 30 Minutes Intravenous Daily 02/28/24 1440 03/09/24 1013   02/28/24 0000  daptomycin  (CUBICIN ) IVPB  Status:  Discontinued        600 mg Intravenous Every 24 hours 02/28/24 1444 03/09/24    02/28/24 0000  ceFEPime  (MAXIPIME ) IVPB  Status:  Discontinued        2 g Intravenous Every 8 hours 02/28/24 1444 03/09/24    02/23/24 2200  vancomycin  (VANCOREADY) IVPB 1500 mg/300 mL  Status:  Discontinued        1,500 mg 150 mL/hr over 120 Minutes Intravenous Every 24 hours 02/22/24 2348 02/23/24 0242   02/23/24 1000  linezolid  (ZYVOX ) IVPB 600 mg  Status:  Discontinued       Note to Pharmacy: Pt developed rash shortly after vancomycin  administration.   600 mg 300 mL/hr over 60 Minutes Intravenous Every 12 hours 02/23/24 0242 02/28/24 1440   02/23/24 0800  piperacillin -tazobactam (ZOSYN ) IVPB 3.375 g  Status:  Discontinued        3.375 g 12.5 mL/hr over 240 Minutes Intravenous Every 8 hours 02/22/24 2348 02/28/24 1440   02/22/24 2315  piperacillin -tazobactam (ZOSYN ) IVPB 3.375 g        3.375 g 100 mL/hr over 30 Minutes Intravenous  Once 02/22/24 2306 02/23/24 0013   02/22/24 2315  vancomycin  (VANCOREADY) IVPB 1500 mg/300 mL        1,500 mg 150 mL/hr over 120 Minutes Intravenous  Once 02/22/24 2306 02/23/24 0141         Subjective: Patient alert eyes open, following some commands.  Some unintelligible speech.  Moving extremities spontaneously.  Denies any chest pain or shortness of breath.  RN at bedside.  Objective: Vitals:   03/10/24 1955 03/10/24 2108 03/11/24 0522 03/11/24 1354  BP:  (!) 148/72 136/72 137/78  Pulse:  83 77 78  Resp: 18 18 18 14   Temp:  98.6 F (37 C) 98.2 F (36.8 C)  98.2 F (36.8 C)  TempSrc:   Oral Oral  SpO2:  98% 98% 99%  Weight:      Height:        Intake/Output Summary (Last 24 hours) at 03/11/2024 2045 Last data filed at 03/11/2024 1800 Gross per 24 hour  Intake 2315.2 ml  Output 500 ml  Net 1815.2 ml   Filed Weights   02/22/24 1238 02/24/24 1245  Weight: 79.4 kg 79.3 kg    Examination:  General exam: Appears calm and comfortable  Respiratory system: Clear to auscultation anterior lung fields.  No wheezes, no crackles, no rhonchi.  Fair air movement.  Speaking in full sentences.[name] Respiratory effort normal. Cardiovascular system: S1 & S2 heard, RRR. No JVD, murmurs, rubs, gallops or clicks. No pedal edema. Gastrointestinal  system: Abdomen is nondistended, soft and nontender. No organomegaly or masses felt. Normal bowel sounds heard. Central nervous system: Alert.  Moving extremities spontaneously.  No focal neurological deficits. Extremities: Symmetric 5 x 5 power. Skin: No rashes, lesions or ulcers Psychiatry: Judgement and insight poor to fair. Mood & affect appropriate.     Data Reviewed: I have personally reviewed following labs and imaging studies  CBC: Recent Labs  Lab 03/05/24 0306 03/07/24 0350 03/10/24 0352 03/11/24 0357  WBC 4.4 6.1 9.7 7.7  NEUTROABS 2.3 3.4 7.3 5.5  HGB 9.2* 9.4* 8.3* 7.7*  HCT 26.7* 27.6* 25.5* 23.5*  MCV 85.9 86.8 90.7 90.4  PLT 95* 129* 171 178    Basic Metabolic Panel: Recent Labs  Lab 03/07/24 0350 03/08/24 0304 03/09/24 0304 03/10/24 0352 03/11/24 0357  NA 131* 133* 137 141 144  K 3.4* 3.6 3.6 3.4* 3.1*  CL 95* 97* 102 108 111  CO2 24 23 20* 19* 19*  GLUCOSE 114* 109* 125* 113* 89  BUN 14 19 32* 42* 41*  CREATININE 1.31* 1.58* 2.33* 2.60* 2.39*  CALCIUM  9.8 9.6 8.7* 8.5* 8.8*  MG 1.8 1.9 1.8 1.8 1.9    GFR: Estimated Creatinine Clearance: 23.5 mL/min (A) (by C-G formula based on SCr of 2.39 mg/dL (H)).  Liver Function Tests: Recent Labs  Lab 03/09/24 0304  03/10/24 0352 03/11/24 0357  AST 36 104* 169*  ALT 13 26 47*  ALKPHOS 103 109 113  BILITOT 0.8 0.7 0.7  PROT 5.7* 5.4* 5.2*  ALBUMIN  2.8* 2.7* 2.6*    CBG: Recent Labs  Lab 03/10/24 1627 03/10/24 2105 03/11/24 0747 03/11/24 1158 03/11/24 1653  GLUCAP 110* 130* 85 113* 93     Recent Results (from the past 240 hours)  MRSA Next Gen by PCR, Nasal     Status: None   Collection Time: 03/11/24  5:49 PM   Specimen: Nasal Mucosa; Nasal Swab  Result Value Ref Range Status   MRSA by PCR Next Gen NOT DETECTED NOT DETECTED Final    Comment: (NOTE) The GeneXpert MRSA Assay (FDA approved for NASAL specimens only), is one component of a comprehensive MRSA colonization surveillance program. It is not intended to diagnose MRSA infection nor to guide or monitor treatment for MRSA infections. Test performance is not FDA approved in patients less than 43 years old. Performed at [facility], [address] [place], [place] [address]          Radiology Studies: EEG adult Result Date: 03/11/2024 Shelton Arlin KIDD, MD     03/11/2024  3:52 PM Patient Name: Charles Lloyd. MRN: [id number] Epilepsy Attending: Arlin KIDD Shelton Referring Physician/Provider: Sebastian Toribio GAILS, MD Date: 03/11/2024 Duration: 24.26 mins Patient history: 84yo M with jerking. EEG to evaluate for seizure Level of alertness: Awake AEDs during EEG study: None Technical aspects: This EEG study was done with scalp electrodes positioned according to the 10-20 International system of electrode placement. Electrical activity was reviewed with band pass filter of 1-70Hz , sensitivity of 7 uV/mm, display speed of 52mm/sec with a 60Hz  notched filter applied as appropriate. EEG data were recorded continuously and digitally stored.  Video monitoring was available and reviewed as appropriate. Description: EEG showed continuous generalized 3 to 6 Hz theta-delta slowing, triphasic morphology. Hyperventilation and  photic stimulation were not performed.   ABNORMALITY - Continuous slow, generalized IMPRESSION: This study is suggestive of generalized cerebral dysfunction (encephalopathy) likely related to toxic-metabolic etiology. No seizures or epileptiform discharges were seen  throughout the recording. Priyanka MALVA Krebs   CT HEAD WO CONTRAST ( ) Result Date: 03/10/2024 CLINICAL DATA:  Delirium acute change EXAM: CT HEAD WITHOUT CONTRAST TECHNIQUE: Contiguous axial images were obtained from the base of the skull through the vertex without intravenous contrast. RADIATION DOSE REDUCTION: This exam was performed according to the departmental dose-optimization program which includes automated exposure control, adjustment of the mA and/or kV according to patient size and/or use of iterative reconstruction technique. COMPARISON:  MRI 02/24/2024, CT brain 02/22/2024 FINDINGS: Brain: No acute territorial infarction, hemorrhage or intracranial mass. Moderate atrophy. Mild chronic small vessel ischemic changes of the white matter. Stable ventricular size Vascular: No hyperdense vessels.  Carotid vascular calcification Skull: Normal. Negative for fracture or focal lesion. Sinuses/Orbits: No acute finding. Other: None IMPRESSION: 1. No CT evidence for acute intracranial abnormality. 2. Atrophy and chronic small vessel ischemic changes of the white matter. Electronically Signed   By: Luke Bun M.D.   On: 03/10/2024 19:34        Scheduled Meds:  allopurinol   100 mg Oral Daily   apixaban   2.5 mg Oral BID   atorvastatin   20 mg Oral QHS   carvedilol   3.125 mg Oral BID   Chlorhexidine  Gluconate Cloth  6 each Topical Daily   cycloSPORINE   1 drop Both Eyes Daily   flecainide   50 mg Oral BID   folic acid   1 mg Intravenous Daily   hydrALAZINE   25 mg Oral BID   hydrocortisone  cream   Topical TID   insulin  aspart  0-6 Units Subcutaneous TID AC & HS   montelukast   10 mg Oral QHS   sodium chloride  flush  3 mL Intravenous Q12H    tamsulosin   0.4 mg Oral QPC supper   thiamine   100 mg Oral Daily   Continuous Infusions:  ciprofloxacin  Stopped (03/11/24 1134)   DAPTOmycin  Stopped (03/10/24 1515)   lactated ringers  100 mL/hr at 03/11/24 1800     LOS: 18 days    Time spent: 40-minute    Toribio Hummer, MD [facility]   To contact the attending provider between 7A-7P or the covering provider during after hours 7P-7A, please log into the web site [email or web address] and access using universal [place] password for that web site. If you do not have the password, please call the hospital operator.  03/11/2024, 8:45 PM    "

## 2024-03-11 NOTE — Procedures (Signed)
 Patient Name: Charles Lloyd.  MRN: [id number]  Epilepsy Attending: Arlin MALVA Krebs  Referring Physician/Provider: Sebastian Toribio GAILS, MD  Date: 03/11/2024 Duration: 24.26 mins  Patient history: 84yo M with jerking. EEG to evaluate for seizure  Level of alertness: Awake  AEDs during EEG study: None  Technical aspects: This EEG study was done with scalp electrodes positioned according to the 10-20 International system of electrode placement. Electrical activity was reviewed with band pass filter of 1-70Hz , sensitivity of 7 uV/mm, display speed of 34mm/sec with a 60Hz  notched filter applied as appropriate. EEG data were recorded continuously and digitally stored.  Video monitoring was available and reviewed as appropriate.  Description: EEG showed continuous generalized 3 to 6 Hz theta-delta slowing, triphasic morphology. Hyperventilation and photic stimulation were not performed.     ABNORMALITY - Continuous slow, generalized  IMPRESSION: This study is suggestive of generalized cerebral dysfunction (encephalopathy) likely related to toxic-metabolic etiology. No seizures or epileptiform discharges were seen throughout the recording.  Dewan Emond O Kaleah Hagemeister

## 2024-03-11 NOTE — Progress Notes (Signed)
 "                                                                                                                                                                                                          Daily Progress Note   Patient Name: Charles Lloyd.       Date: 03/11/2024 DOB: 04-05-1940  Age: 84 y.o. MRN#: [id number] Attending Physician: Sebastian Toribio GAILS, MD Primary Care Physician: Theophilus Andrews, Tully GRADE, MD Admit Date: 02/22/2024  Reason for Follow-up: Establishing goals of care  Patient Profile/HPI:   84 y.o. male  with past medical history of recent lumbar surgery, HTN, hyperlipidemia, DM2, a-fib on anticoagulation, CHF, gout, CAD, neuropathy admitted on 02/22/2024 with leg pain and weakness. MRI revealed psoas abscess and he underwent aspiration of abscess on 12/29.  Admission has been complicated by altered mental status- initially altered after receiving lorazepam  for MRI on admission, but continued to have agitation. There was concern for alcohol  withdrawal vs benzo related delirium and he was transferred to the ICU, started on precedex  and completed phenobarbitol taper. Mental status returned to baseline around 12/31 and discharge planning was in process. CIR was recommended, but insurance denied. He developed some confusion around 1/8, and 1/9. He was started on Rozerem  by psych on 1/9 which has been discontinued. Palliative medicine initial consult completed 1/10.  1/10-1/13- patient with acute mental status change- somnolent, unable to eat or drink, nonverbal.  I am seeing patient today 1/14 for continued followup.   Discussion: CT scan head results reviewed and discussed with family- no acute findings.  AM cortisol 1/14 was 9.6, borderline suggestive of adrenal insufficiency. Liver enzymes are elevated today, ammonia is normal. Cr is improved 2.39.  Today patient's mental status is improved. I was at bedside as [name], SLP was completing bedside swallow test. Discussed  with her- patient tolerated thin liquids and some applesauce- recommendation for pureed diet and thin liquids.  He is awake and alert, but is disoriented. He is dysarthric. Answers some questions appropriately. Able to identify his daughter. Unable to follow my commands (squeeze hands, point to nose, press feet against my hands).  I met with patient's daughter outside the room, son was on speakerphone. Reviewed results of CT scan and labs.  Son continues to express frustration with lack of reasoning for patient's acute change in mental status. [name] He expressed grievances over feeling that he was not heard over the weekend. However, he voiced feeling better now that more diagnostics are being pursued.  He is hopeful that patient will  continue to improve, but also understands there remains high risk for further decompensation.  Family inquired about placement options if patient continues to improve. They were hopeful for d/c to [facility], however, not insurance authorization has expired. We discussed that insurance authorization would need to be reobtained.  They inquired about financial coverage of hospital stay by insurance. I let them know I would reach out to social work, and also encouraged them to call patient's insurance company for coverage information.  Code status was discussed. They would not want patient to undergo CPR in case of cardiac arrest, however, they desire all interventions prior to arrest, including intubation be conducted in efforts to prevent arrest.    Review of Systems  Unable to perform ROS: Mental status change     Physical Exam Vitals and nursing note reviewed.  Constitutional:      General: He is not in acute distress. Cardiovascular:     Rate and Rhythm: Normal rate.     Pulses: Normal pulses.  Neurological:     Mental Status: He is alert. He is disoriented.             Vital Signs: BP 136/72 (BP Location: Left Arm)   Pulse 77   Temp 98.2 F (36.8 C)  (Oral)   Resp 18   Ht 5' 8 (1.727 m)   Wt 79.3 kg   SpO2 98%   BMI 26.58 kg/m  SpO2: SpO2: 98 % O2 Device: O2 Device: Room Air O2 Flow Rate: O2 Flow Rate (L/min): 3 L/min  Intake/output summary:  Intake/Output Summary (Last 24 hours) at 03/11/2024 1113 Last data filed at 03/11/2024 0510 Gross per 24 hour  Intake 4953.28 ml  Output 900 ml  Net 4053.28 ml   LBM: Last BM Date : 03/10/24 Baseline Weight: Weight: 79.4 kg Most recent weight: Weight: 79.3 kg       Palliative Assessment/Data: PPS: 30%      Patient Active Problem List   Diagnosis Date Noted   Stress reaction causing mixed disturbance of emotion and conduct 03/06/2024   Acute on chronic urinary retention 02/29/2024   Pressure injury 02/27/2024   Altered mental status 02/24/2024   Alcohol  withdrawal syndrome, with delirium (HCC) 02/24/2024   Delirium 02/24/2024   Psoas abscess, left (HCC) 02/22/2024   Elective surgery 02/06/2024   Bilateral leg pain 11/29/2023   Acute posthemorrhagic anemia 06/21/2022   Bilateral lower extremity edema 04/06/2022   Coronary artery disease involving native coronary artery of native heart without angina pectoris 04/05/2022   Gout 02/27/2022   Acute renal failure superimposed on stage 3b chronic kidney disease (HCC) 11/16/2021   Uremia 11/16/2021   Gout due to renal impairment 10/17/2021   Pancytopenia (HCC) 08/05/2021   SIRS (systemic inflammatory response syndrome) (HCC) 08/04/2021   Chronic kidney disease, stage 3b (HCC) 12/30/2020   Recurrent falls    Spinal stenosis    S/P TKR (total knee replacement), bilateral    Arthralgia 11/20/2019   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   Lesion of liver 04/20/2019   Secondary hypercoagulable state 04/09/2019   Eosinophil count raised 02/17/2019   Acute tracheobronchitis 01/05/2019   Asthma    Hyponatremia 12/17/2018   Vitamin D  deficiency 07/25/2018   Chronic anticoagulation 03/06/2018   Hyperlipidemia associated with type 2  diabetes mellitus (HCC) 01/28/2018   Basal cell carcinoma (BCC) of left side of nose 01/28/2018   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   Artificial knee joint present 05/20/2017   Left knee pain 05/07/2017  B12 deficiency 10/22/2016   Overweight (BMI 25.0-29.9) 10/22/2016   Primary osteoarthritis of right knee 03/29/2015   Osteoarthritis of right knee 03/29/2015   History of cholecystectomy 11/20/2014   Occult blood in stools 02/09/2014   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   Paroxysmal A-fib (HCC) 04/16/2013   Chronic heart failure with preserved ejection fraction (HFpEF) (HCC) 04/16/2013   Colon polyps 04/16/2013   NIDDM-2 with polyneuropathy and hyperglycemia 04/16/2013   Tinea cruris 04/16/2013   Diabetic neuropathy (HCC) 07/14/2012   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) 07/14/2012   Insomnia 03/04/2012   History of cardiovascular disorder 08/16/2011   Hypertensive heart disease with heart failure (HCC) 01/06/2010   Neoplasm of prostate, malignant (HCC) 01/06/2010   Hypercholesterolemia 01/06/2010   Essential hypertension 01/06/2010    Palliative Care Assessment & Plan    Assessment/Recommendations/Plan  Admitted with psoas abscess after surgical procedure- recovery complicated by ETOH withdrawal/delirium which initially improved after phenobarbital  taper, however, patient had acute change again with hypoactive delirium symptoms- thus far etiology is unclear- concerns remain for: hospital delirium, seizures, brain injury,  - recommend further workup with EEG and MRI for reasonable prognostication. Discussed with Dr. Marvine and Dr. Sebastian Code status changed to DNR- pre- arrest interventions desired    Code Status:   Code Status: Do not attempt resuscitation (DNR) PRE-ARREST INTERVENTIONS DESIRED   Prognosis:  Unable to determine  Discharge Planning: To Be Determined   Thank you for allowing the Palliative Medicine Team to assist in the  care of this patient.  I personally spent a total of 90 minutes in the care of the patient today including preparing to see the patient, getting/reviewing separately obtained history, performing a medically appropriate exam/evaluation, counseling and educating, placing orders, referring and communicating with other health care professionals, documenting clinical information in the EHR, and communicating results.   Cassondra Stain, AGNP-C Palliative Medicine   Please contact Palliative Medicine Team phone at [phone] for questions and concerns.        "

## 2024-03-11 NOTE — Progress Notes (Signed)
 EEG complete - results pending

## 2024-03-11 NOTE — Progress Notes (Signed)
 OT Cancellation Note  Patient Details Name: Charles Lloyd. MRN: [id number] DOB: 09-Jan-1941   Cancelled Treatment:    Reason Eval/Treat Not Completed: Medical issues which prohibited therapy. [name] was present and politely asked for therapy to hold for today, stating the pt recently underwent an EEG to monitor for possible seizure activity. He stated the pt was in a catatonic state yesterday and is still with decreased overall cognition and command follow today. Will check back another day, as able and appropriate.   Delanna Lesches, OTR/L 03/11/2024, 5:08 PM

## 2024-03-11 NOTE — Progress Notes (Signed)
 Speech Language Pathology Treatment: Dysphagia  Patient Details Name: Charles Lloyd. MRN: [id number] DOB: 1940-08-10 Today's Date: 03/11/2024 Time: 9061-8981 SLP Time Calculation (min) (ACUTE ONLY): 40 min  Assessment / Plan / Recommendation Clinical Impression  PLAN:  start dysphagia 1 diet/thin liquids Meds whole in puree Anticipate fluctuating ability to recognize POs.  If food/drink spills from mouth, cue him to close his lips around straw or spoon.  If he is unable, cease efforts but try again later. No concerns for aspiration today SLP will follow  Pt participated in swallowing tx. Oral care provided, removing thick secretions from posterior palate. He was much more interactive today, speaking and smiling, particularly when his daughter returned to the room.  Ability to recognize/initiate a motor pattern for drinking and eating fluctuated greatly. There were moments when he drank from a straw and consumed several bites of applesauce without difficulty, followed by moments when all material would spill from his open mouth.  Kasie Mahan, Palliative care, and pt's daughter, Almarie, present during latter part of session.     HPI HPI: Mithcell Schumpert. is a 84 y.o. male who presented to the emergency department on 02/22/2024 for leg pain and weakness, with family reporting multiple falls over the past 6 to 8 months.  MRI lumbar spine revealed a new 3.5 x 2.4 cm fluid collection in the left psoas muscle concerning for abscess.  Neurosurgery was consulted at that time and recommended IV antibiotics and possible aspiration of abscess. Recent change in MS precipitated swallowing consult. PMH HTN, hyperlipidemia, DM2, atrial fibrillation on anticoagulation, s/p recent L4-5 trans psoas lumbar interbody fusion left-sided approach on 02/06/2024, CHF,gout,CAD, TKA with infection, neuropathy      SLP Plan  Continue with current plan of care        Swallow Evaluation Recommendations    Recommendations: PO diet PO Diet Recommendation: Dysphagia 1 (Pureed);Thin liquids (Level 0) Liquid Administration via: Cup;Straw Medication Administration: Whole meds with puree Supervision: Full assist for feeding Postural changes: Position pt fully upright for meals Oral care recommendations: Oral care QID (4x/day)     Recommendations                     Oral care QID           Continue with current plan of care    Estefania Kamiya L. Vona, MA CCC/SLP Clinical Specialist - Acute Care SLP Acute Rehabilitation Services Office number [phone]  Vona Palma Laurice  03/11/2024, 10:15 AM

## 2024-03-11 NOTE — Plan of Care (Signed)
 Patient is alert and attempting to verbally communicate today, though confused about where he is and why.  Moved to dysphagia 1 diet and instructed to give oral meds with pureed food.   Problem: Metabolic: Goal: Ability to maintain appropriate glucose levels will improve Outcome: Progressing   Problem: Nutritional: Goal: Maintenance of adequate nutrition will improve Outcome: Progressing   Problem: Clinical Measurements: Goal: Diagnostic test results will improve Outcome: Progressing Goal: Respiratory complications will improve Outcome: Progressing Goal: Cardiovascular complication will be avoided Outcome: Progressing   Problem: Activity: Goal: Risk for activity intolerance will decrease Outcome: Progressing

## 2024-03-11 NOTE — Progress Notes (Addendum)
 " [facility] Progress Note    Assessment/ Plan:   AKI -normal baseline Cr. No obstruction on renal ultrasound. Suspect hemodynamic insults, had some intermittent low Bps. Cannot rule out AIN in the context of cefepime , urine sediment indiscernible given hematuria. Cefepime  stopped 1/13. Cr down to  2.39 today. Hesitant on utilizing prednisone  given psoas abscess -continue with supportive care--LR: 100cc/hr x 1 day -will check serologies in the interim: ANCA, ANA, SPEP, FLC-pending -Avoid nephrotoxic medications including NSAIDs and iodinated intravenous contrast exposure unless the latter is absolutely indicated.  Preferred narcotic agents for pain control are hydromorphone , fentanyl , and methadone. Morphine  should not be used. Avoid Baclofen and avoid oral sodium phosphate  and magnesium  citrate based laxatives / bowel preps. Continue strict Input and Output monitoring. Will monitor the patient closely with you and intervene or adjust therapy as indicated by changes in clinical status/labs    Hyponatremia -resolved, stopped salt tabs, monitor  Hematuria -secondary to foley? Normal renal ultrasound. Serologies pending   Metabolic acidosis -mild, watching for now, LR plan as above   Left psoas abscess -was receiving dapto and cefepime --switched to dapto and cipro    Delirium EtOH withdrawals -per primary  Elevated LFT's -mgmt/work up per primary service   Myoclonic jerks -possibly cefepime  related? CK WNL (on dapto) 1/12. Seems to have improved today   Chronic diastolic CHF -agree with holding torsemide  for now   Anemia -transfuse prn for hgb <7   DM2 -mgmt per primary service  Subjective:   Patient seen and examined in room. Seems to be more awake. UOP 0.9L. Shrugs his shoulders when asked how he is doing   Objective:   BP 137/78 (BP Location: Left Arm)   Pulse 78   Temp 98.2 F (36.8 C) (Oral)   Resp 14   Ht 5' 8 (1.727 m)   Wt 79.3 kg   SpO2 99%    BMI 26.58 kg/m   Intake/Output Summary (Last 24 hours) at 03/11/2024 1354 Last data filed at 03/11/2024 0510 Gross per 24 hour  Intake 4953.28 ml  Output 900 ml  Net 4053.28 ml   Weight change:   Physical Exam: Gen: NAD, ill appearing CVS: RRR Resp: CTA BL Abd: soft, nt/nd Ext: no edema Neuro: slow to respond, aphasic, no myoclonic jerks observed, awake GU: +foley with dark/amber urine in bag  Imaging: CT HEAD WO CONTRAST ( ) Result Date: 03/10/2024 CLINICAL DATA:  Delirium acute change EXAM: CT HEAD WITHOUT CONTRAST TECHNIQUE: Contiguous axial images were obtained from the base of the skull through the vertex without intravenous contrast. RADIATION DOSE REDUCTION: This exam was performed according to the departmental dose-optimization program which includes automated exposure control, adjustment of the mA and/or kV according to patient size and/or use of iterative reconstruction technique. COMPARISON:  MRI 02/24/2024, CT brain 02/22/2024 FINDINGS: Brain: No acute territorial infarction, hemorrhage or intracranial mass. Moderate atrophy. Mild chronic small vessel ischemic changes of the white matter. Stable ventricular size Vascular: No hyperdense vessels.  Carotid vascular calcification Skull: Normal. Negative for fracture or focal lesion. Sinuses/Orbits: No acute finding. Other: None IMPRESSION: 1. No CT evidence for acute intracranial abnormality. 2. Atrophy and chronic small vessel ischemic changes of the white matter. Electronically Signed   By: Luke Bun M.D.   On: 03/10/2024 19:34    Labs: BMET Recent Labs  Lab 03/05/24 0306 03/06/24 0434 03/07/24 0350 03/08/24 0304 03/09/24 0304 03/10/24 0352 03/11/24 0357  NA 126* 130* 131* 133* 137 141 144  K 3.3* 3.2* 3.4*  3.6 3.6 3.4* 3.1*  CL 94* 97* 95* 97* 102 108 111  CO2 23 24 24 23  20* 19* 19*  GLUCOSE 108* 105* 114* 109* 125* 113* 89  BUN 11 12 14 19  32* 42* 41*  CREATININE 0.90 1.20 1.31* 1.58* 2.33* 2.60* 2.39*   CALCIUM  9.3 9.2 9.8 9.6 8.7* 8.5* 8.8*   CBC Recent Labs  Lab 03/05/24 0306 03/07/24 0350 03/10/24 0352 03/11/24 0357  WBC 4.4 6.1 9.7 7.7  NEUTROABS 2.3 3.4 7.3 5.5  HGB 9.2* 9.4* 8.3* 7.7*  HCT 26.7* 27.6* 25.5* 23.5*  MCV 85.9 86.8 90.7 90.4  PLT 95* 129* 171 178    Medications:     allopurinol   100 mg Oral Daily   apixaban   2.5 mg Oral BID   atorvastatin   20 mg Oral QHS   carvedilol   3.125 mg Oral BID   Chlorhexidine  Gluconate Cloth  6 each Topical Daily   cycloSPORINE   1 drop Both Eyes Daily   flecainide   50 mg Oral BID   folic acid   1 mg Intravenous Daily   hydrALAZINE   25 mg Oral BID   hydrocortisone  cream   Topical TID   insulin  aspart  0-6 Units Subcutaneous TID AC & HS   montelukast   10 mg Oral QHS   sodium chloride  flush  3 mL Intravenous Q12H   tamsulosin   0.4 mg Oral QPC supper   thiamine   100 mg Oral Daily      Ephriam Stank, MD [facility] 03/11/2024, 1:54 PM   "

## 2024-03-12 ENCOUNTER — Inpatient Hospital Stay (HOSPITAL_COMMUNITY)

## 2024-03-12 DIAGNOSIS — E871 Hypo-osmolality and hyponatremia: Secondary | ICD-10-CM | POA: Diagnosis not present

## 2024-03-12 DIAGNOSIS — K6812 Psoas muscle abscess: Secondary | ICD-10-CM | POA: Diagnosis not present

## 2024-03-12 DIAGNOSIS — R339 Retention of urine, unspecified: Secondary | ICD-10-CM | POA: Diagnosis not present

## 2024-03-12 DIAGNOSIS — R410 Disorientation, unspecified: Secondary | ICD-10-CM | POA: Diagnosis not present

## 2024-03-12 LAB — COMPREHENSIVE METABOLIC PANEL WITH GFR
ALT: 87 U/L — ABNORMAL HIGH (ref 0–44)
AST: 292 U/L — ABNORMAL HIGH (ref 15–41)
Albumin: 2.6 g/dL — ABNORMAL LOW (ref 3.5–5.0)
Alkaline Phosphatase: 88 U/L (ref 38–126)
Anion gap: 12 (ref 5–15)
BUN: 37 mg/dL — ABNORMAL HIGH (ref 8–23)
CO2: 18 mmol/L — ABNORMAL LOW (ref 22–32)
Calcium: 9.1 mg/dL (ref 8.9–10.3)
Chloride: 112 mmol/L — ABNORMAL HIGH (ref 98–111)
Creatinine, Ser: 2.09 mg/dL — ABNORMAL HIGH (ref 0.61–1.24)
GFR, Estimated: 31 mL/min — ABNORMAL LOW
Glucose, Bld: 91 mg/dL (ref 70–99)
Potassium: 3.2 mmol/L — ABNORMAL LOW (ref 3.5–5.1)
Sodium: 142 mmol/L (ref 135–145)
Total Bilirubin: 0.6 mg/dL (ref 0.0–1.2)
Total Protein: 5.2 g/dL — ABNORMAL LOW (ref 6.5–8.1)

## 2024-03-12 LAB — CBC WITH DIFFERENTIAL/PLATELET
Abs Immature Granulocytes: 0.04 K/uL (ref 0.00–0.07)
Basophils Absolute: 0 K/uL (ref 0.0–0.1)
Basophils Relative: 1 %
Eosinophils Absolute: 0.5 K/uL (ref 0.0–0.5)
Eosinophils Relative: 7 %
HCT: 25.1 % — ABNORMAL LOW (ref 39.0–52.0)
Hemoglobin: 8 g/dL — ABNORMAL LOW (ref 13.0–17.0)
Immature Granulocytes: 1 %
Lymphocytes Relative: 15 %
Lymphs Abs: 1 K/uL (ref 0.7–4.0)
MCH: 29.1 pg (ref 26.0–34.0)
MCHC: 31.9 g/dL (ref 30.0–36.0)
MCV: 91.3 fL (ref 80.0–100.0)
Monocytes Absolute: 0.6 K/uL (ref 0.1–1.0)
Monocytes Relative: 8 %
Neutro Abs: 4.7 K/uL (ref 1.7–7.7)
Neutrophils Relative %: 68 %
Platelets: 187 K/uL (ref 150–400)
RBC: 2.75 MIL/uL — ABNORMAL LOW (ref 4.22–5.81)
RDW: 17.9 % — ABNORMAL HIGH (ref 11.5–15.5)
WBC: 6.8 K/uL (ref 4.0–10.5)
nRBC: 0 % (ref 0.0–0.2)

## 2024-03-12 LAB — ANA W/REFLEX IF POSITIVE: Anti Nuclear Antibody (ANA): NEGATIVE

## 2024-03-12 LAB — GLUCOSE, CAPILLARY
Glucose-Capillary: 77 mg/dL (ref 70–99)
Glucose-Capillary: 103 mg/dL — ABNORMAL HIGH (ref 70–99)
Glucose-Capillary: 123 mg/dL — ABNORMAL HIGH (ref 70–99)
Glucose-Capillary: 120 mg/dL — ABNORMAL HIGH (ref 70–99)

## 2024-03-12 LAB — MAGNESIUM: Magnesium: 1.8 mg/dL (ref 1.7–2.4)

## 2024-03-12 MED ORDER — GABAPENTIN 300 MG PO CAPS
300.0000 mg | ORAL_CAPSULE | Freq: Three times a day (TID) | ORAL | Status: DC
  Administered 2024-03-12 – 2024-03-16 (×11): 300 mg via ORAL
  Filled 2024-03-12 (×12): qty 1

## 2024-03-12 MED ORDER — LACTATED RINGERS IV SOLN: INTRAVENOUS | Status: DC

## 2024-03-12 MED ORDER — SODIUM BICARBONATE 650 MG PO TABS
650.0000 mg | ORAL_TABLET | Freq: Two times a day (BID) | ORAL | Status: AC
  Administered 2024-03-12 – 2024-03-14 (×5): 650 mg via ORAL
  Filled 2024-03-12 (×6): qty 1

## 2024-03-12 MED ORDER — POLYVINYL ALCOHOL 1.4 % OP SOLN
1.0000 [drp] | OPHTHALMIC | Status: DC | PRN
  Administered 2024-03-12 – 2024-03-15 (×2): 1 [drp] via OPHTHALMIC
  Filled 2024-03-12: qty 15

## 2024-03-12 MED ORDER — DEXTROSE-SODIUM CHLORIDE 5-0.9 % IV SOLN: INTRAVENOUS | Status: DC

## 2024-03-12 MED ORDER — POTASSIUM CHLORIDE 20 MEQ PO PACK
40.0000 meq | PACK | ORAL | Status: AC
  Administered 2024-03-12: 40 meq via ORAL
  Filled 2024-03-12: qty 2

## 2024-03-12 NOTE — TOC Progression Note (Addendum)
 Transition of Care (TOC) - Progression Note    Patient Details  Name: Charles Lloyd. MRN: [id number] Date of Birth: [birth year]  Transition of Care [facility]) CM/SW Contact  Minard Millirons, Nathanel, RN Phone Number: 03/12/2024, 9:58 AM  Clinical Narrative:continue to monitor for d/c plans-awaiting medical stability if ST SNF appropriate-re start auth for [facility] if appropriate or other venues for d/c-noted nephrology/neurology/palliative care all following.  -10a-spoke to [employer]) he & his sister will f/u on the insurance policy by calling the insurance directly.      Expected Discharge Plan:  (TBD) Barriers to Discharge: Continued Medical Work up               Expected Discharge Plan and Services In-house Referral: NA Discharge Planning Services: CM Consult Post Acute Care Choice: Durable Medical Equipment, IP Rehab Living arrangements for the past 2 months: Single Family Home Expected Discharge Date: 03/01/24                                     Social Drivers of Health (SDOH) Interventions SDOH Screenings   Food Insecurity: No Food Insecurity (02/23/2024)  Housing: Low Risk (02/23/2024)  Transportation Needs: No Transportation Needs (02/23/2024)  Utilities: Not At Risk (02/23/2024)  Alcohol  Screen: Low Risk (12/21/2022)  Depression (PHQ2-9): Low Risk (02/11/2024)  Financial Resource Strain: Low Risk (12/21/2022)  Physical Activity: Inactive (12/21/2022)  Social Connections: Socially Isolated (02/23/2024)  Stress: No Stress Concern Present (12/21/2022)  Tobacco Use: Low Risk (02/22/2024)  Health Literacy: Adequate Health Literacy (12/21/2022)    Readmission Risk Interventions    02/26/2024    2:28 PM 08/08/2021   10:00 AM  Readmission Risk Prevention Plan  Transportation Screening Complete Complete  PCP or Specialist Appt within 3-5 Days Complete   HRI or Home Care Consult Complete   Social Work Consult for Recovery Care Planning/Counseling  Complete   Palliative Care Screening Not Applicable   Medication Review [employer]) Complete Complete  PCP or Specialist appointment within 3-5 days of discharge  Complete  HRI or Home Care Consult  Complete  SW Recovery Care/Counseling Consult  Not Complete  Palliative Care Screening  Not Applicable  Skilled Nursing Facility  Not Applicable

## 2024-03-12 NOTE — Progress Notes (Signed)
 " [facility] Progress Note    Assessment/ Plan:   AKI -normal baseline Cr. No obstruction on renal ultrasound. Suspect hemodynamic insults, had some intermittent low Bps. Cannot rule out AIN in the context of cefepime , urine sediment indiscernible given hematuria. Cefepime  stopped 1/13. Cr down to  2.09 today. Hesitant on utilizing prednisone  given psoas abscess regardless his Cr is improving -continue with supportive care in the interim, can c/w fluids -checked serologies: ANA, ANCA negative. SPEP pending (follow). FLC ratio WNL -Avoid nephrotoxic medications including NSAIDs and iodinated intravenous contrast exposure unless the latter is absolutely indicated.  Preferred narcotic agents for pain control are hydromorphone , fentanyl , and methadone. Morphine  should not be used. Avoid Baclofen and avoid oral sodium phosphate  and magnesium  citrate based laxatives / bowel preps. Continue strict Input and Output monitoring. Will monitor the patient closely with you and intervene or adjust therapy as indicated by changes in clinical status/labs    Hyponatremia -resolved, stopped salt tabs, monitor  Hematuria -secondary to foley? Normal renal ultrasound. Serologies negative -if it doesn't clear then may need to involve urology   Metabolic acidosis -mild, watch for now   Left psoas abscess -was receiving dapto and cefepime --switched to dapto and cipro    Delirium EtOH withdrawals -per primary  Elevated LFT's -mgmt/work up per primary service, worsening -abd us  today per primary service   Myoclonic jerks -possibly cefepime  related? CK WNL (on dapto) 1/12. Resolved upon my eval today   Chronic diastolic CHF -agree with holding torsemide  for now   Anemia -transfuse prn for hgb <7   DM2 -mgmt per primary service  Nothing else to add at this junction, will sign off. Please call with any questions/concerns  Subjective:   Patient seen and examined in room. Looks MUCH  better. Interactive today. No complaints   Objective:   BP (!) 150/75 (BP Location: Left Arm)   Pulse 78   Temp (!) 97.4 F (36.3 C) (Oral)   Resp 18   Ht 5' 8 (1.727 m)   Wt 79.3 kg   SpO2 98%   BMI 26.58 kg/m   Intake/Output Summary (Last 24 hours) at 03/12/2024 1235 Last data filed at 03/12/2024 1027 Gross per 24 hour  Intake 2484.31 ml  Output 1050 ml  Net 1434.31 ml   Weight change:   Physical Exam: Gen: NAD, ill appearing CVS: RRR Resp: CTA BL Abd: soft, nt/nd Ext: no edema Neuro: slow to respond, aphasic, no myoclonic jerks observed, awake GU: +foley with dark/amber urine in bag  Imaging: EEG adult Result Date: 03/11/2024 Shelton Arlin KIDD, MD     03/11/2024  3:52 PM Patient Name: Zachary GORMAN Juanito Mickey. MRN: [id number] Epilepsy Attending: Arlin KIDD Shelton Referring Physician/Provider: Sebastian Toribio GAILS, MD Date: 03/11/2024 Duration: 24.26 mins Patient history: 84yo M with jerking. EEG to evaluate for seizure Level of alertness: Awake AEDs during EEG study: None Technical aspects: This EEG study was done with scalp electrodes positioned according to the 10-20 International system of electrode placement. Electrical activity was reviewed with band pass filter of 1-70Hz , sensitivity of 7 uV/mm, display speed of 26mm/sec with a 60Hz  notched filter applied as appropriate. EEG data were recorded continuously and digitally stored.  Video monitoring was available and reviewed as appropriate. Description: EEG showed continuous generalized 3 to 6 Hz theta-delta slowing, triphasic morphology. Hyperventilation and photic stimulation were not performed.   ABNORMALITY - Continuous slow, generalized IMPRESSION: This study is suggestive of generalized cerebral dysfunction (encephalopathy) likely related to toxic-metabolic etiology. No  seizures or epileptiform discharges were seen throughout the recording. Priyanka MALVA Krebs   CT HEAD WO CONTRAST ( ) Result Date: 03/10/2024 CLINICAL DATA:   Delirium acute change EXAM: CT HEAD WITHOUT CONTRAST TECHNIQUE: Contiguous axial images were obtained from the base of the skull through the vertex without intravenous contrast. RADIATION DOSE REDUCTION: This exam was performed according to the departmental dose-optimization program which includes automated exposure control, adjustment of the mA and/or kV according to patient size and/or use of iterative reconstruction technique. COMPARISON:  MRI 02/24/2024, CT brain 02/22/2024 FINDINGS: Brain: No acute territorial infarction, hemorrhage or intracranial mass. Moderate atrophy. Mild chronic small vessel ischemic changes of the white matter. Stable ventricular size Vascular: No hyperdense vessels.  Carotid vascular calcification Skull: Normal. Negative for fracture or focal lesion. Sinuses/Orbits: No acute finding. Other: None IMPRESSION: 1. No CT evidence for acute intracranial abnormality. 2. Atrophy and chronic small vessel ischemic changes of the white matter. Electronically Signed   By: Luke Bun M.D.   On: 03/10/2024 19:34    Labs: BMET Recent Labs  Lab 03/06/24 0434 03/07/24 0350 03/08/24 0304 03/09/24 0304 03/10/24 0352 03/11/24 0357 03/12/24 0412  NA 130* 131* 133* 137 141 144 142  K 3.2* 3.4* 3.6 3.6 3.4* 3.1* 3.2*  CL 97* 95* 97* 102 108 111 112*  CO2 24 24 23  20* 19* 19* 18*  GLUCOSE 105* 114* 109* 125* 113* 89 91  BUN 12 14 19  32* 42* 41* 37*  CREATININE 1.20 1.31* 1.58* 2.33* 2.60* 2.39* 2.09*  CALCIUM  9.2 9.8 9.6 8.7* 8.5* 8.8* 9.1   CBC Recent Labs  Lab 03/07/24 0350 03/10/24 0352 03/11/24 0357 03/12/24 0412  WBC 6.1 9.7 7.7 6.8  NEUTROABS 3.4 7.3 5.5 4.7  HGB 9.4* 8.3* 7.7* 8.0*  HCT 27.6* 25.5* 23.5* 25.1*  MCV 86.8 90.7 90.4 91.3  PLT 129* 171 178 187    Medications:     allopurinol   100 mg Oral Daily   apixaban   2.5 mg Oral BID   carvedilol   3.125 mg Oral BID   Chlorhexidine  Gluconate Cloth  6 each Topical Daily   cycloSPORINE   1 drop Both Eyes Daily    flecainide   50 mg Oral BID   folic acid   1 mg Intravenous Daily   gabapentin   300 mg Oral TID   hydrALAZINE   25 mg Oral BID   hydrocortisone  cream   Topical TID   insulin  aspart  0-6 Units Subcutaneous TID AC & HS   montelukast   10 mg Oral QHS   potassium chloride   40 mEq Oral Q4H   sodium chloride  flush  3 mL Intravenous Q12H   tamsulosin   0.4 mg Oral QPC supper   thiamine   100 mg Oral Daily      Ephriam Stank, MD [facility] 03/12/2024, 12:35 PM   "

## 2024-03-12 NOTE — Progress Notes (Addendum)
 " PROGRESS NOTE    Charles Lloyd.  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 He was found to have a 3.5 x 2.4 cm L psoas muscle abscess. Neurosurgery recommended IR drainage and IV antibiotics. Underwent CT-guided aspiration on 02/24/24. Developed hospital-associated delirium requiring ICU transfer, now improved. Currently on daptomycin  and cefepime  till 04/13/2024 as per ID recommendations. Psychiatry consulted on 03/06/2024 for intermittent agitation and as per family request. Palliative care also consulted for goals of care discussion.    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 - Patient seen in consultation by IR and patient underwent CT-guided aspiration of left psoas fluid collection with drain placement. - No growth noted on fluid and blood cultures. - Patient was seen in consultation by ID and on daptomycin  and cefepime  with end date scheduled for 04/13/2024 however due to AKI patient was reassessed by ID and due to concerns for cefepime  being more likely to cause AIN, and myoclonus and as such cefepime  was  discontinued and ID recommended starting patient on ciprofloxacin . - ID following and appreciate input and recommendations.  2.  Acute metabolic encephalopathy/delirium/alcohol  withdrawal syndrome with delirium/possible underlying dementia -Patient initially required Precedex  drip which has been tapered off. - Status post completion of phenobarbital  taper. - MRI brain done on 02/24/2024 negative for any acute abnormalities. - Repeat CT head done 03/10/2024 negative for any acute intracranial abnormalities. - Patient with no respiratory symptoms. - Blood cultures obtained negative. - Ammonia level within normal limits at 19. - Vitamin B12 noted at 718. - TSH within normal limits at 2.510. - Folate level noted > 20. - EEG done suggestive of generalized cerebral dysfunction likely related to toxic metabolic etiology.  No seizures or epileptiform discharges were seen throughout the recording. -It is noted that son concerned about paradoxical response to Ativan  and requesting Ativan  be avoided which has been added as an allergy. -Patient also seen in consultation by psychiatry 03/06/2024 for intermittent agitation as per family request.  Patient was started on Rozerem  per psychiatry and patient subsequently became very sleepy with intermittent tremors and poor low oral intake that was abnormal was discontinued by psychiatry. - Patient improved clinically, alert to self, and place, knows what year it is.  Speech has improved.  Patient with some intermittent bouts of confusion but overall has improved.  -It is noted per Dr.Alekh that son wants to avoid MRI as much as possible as patient might need more sedation to get it done. -Gabapentin  resumed but renally dosed today.  -As patient has improved clinically we will hold off on repeat MRI at this  time. - Continue treatment as in problem #1. - Palliative care following.  3.  Acute kidney injury -Patient noted to have worsening renal function and fluid  likely secondary to a prerenal azotemia in the setting of low BPs and concern for AIN in the setting of cefepime . -Renal ultrasound negative for hydronephrosis. - Patient seen in consultation by urology and cefepime  noted to have been discontinued 03/10/2024. -Renal function slowly trending down currently at 2.09 today. -ANA, ANCA negative.  SPEP pending. - Nephrology recommending continuation of supportive care with IV fluids of LR at 100 cc/h for the next 24 hours. - Nephrology following.  4.  Hyponatremia -Resolved.  5.  Metabolic acidosis -Likely secondary to AKI. -Place on bicarb tablets. - Follow.  6.  Paroxysmal atrial fibrillation - Currently rate controlled on Coreg , flecainide .   - Eliquis  for anticoagulation.    7.  Chronic diastolic heart failure/hypertension/hyperlipidemia -Currently compensated. - Continue hydralazine , Coreg . - Torsemide  resumed on 03/04/2024 but held on 03/07/2024 due to increasing creatinine/AKI. - Outpatient follow-up with cardiology. - Continue statin.  8.  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 - CBG noted at 77 this morning. - SSI. - Resume gabapentin , renally dosed.    9. asthma -Stable. - Montelukast .  10.  Recurrent falls -PT/OT. - Likely needs SNF.  11.  Acute on chronic urinary retention -Patient noted to have failed voiding trial on 03/04/2024 and Foley catheter had to be placed on 03/04/2024. - Continue Flomax . -Would likely be discharged with Foley catheter in place. - Outpatient follow-up with urology.  12.  Hypomagnesemia -Repleted.  13.  Hypokalemia - Replete.  14.  Transaminitis -Check an acute hepatitis panel. - Check right upper quadrant ultrasound. -Hold statin. - Repeat labs in the AM.  15.  Anemia of chronic disease - Hemoglobin stable at 8.0.   - Follow H&H.   16.  Thrombocytopenia -Resolved.   17. Pressure injury, Wound 02/24/24 1245 Pressure Injury  Buttocks Mid;Lower Deep Tissue Pressure Injury - Purple or maroon localized area of discolored intact skin or blood-filled blister due to damage of underlying soft tissue from pressure and/or shear. (Active)     Wound 02/24/24 1245 Pressure Injury Scrotum Circumferential Deep Tissue Pressure Injury - Purple or maroon localized area of discolored intact skin or blood-filled blister due to damage of underlying soft tissue from pressure and/or shear. (Active)       DVT prophylaxis: Eliquis  Code Status: DNR Family Communication: No family at bedside. Disposition: TBD  Status is: Inpatient Remains inpatient appropriate because: Severity of illness   Consultants:  Neurosurgery: Dr. Colon 02/23/2024 Interventional radiology: Dr. Jennefer 02/23/2024 PCCM Dr. Kara 02/24/2024 ID: Dr. Overton 02/28/2024 Psychiatry: Dr. Larina 03/06/2024 Palliative care Stephane Palin, NP 03/07/2024 Nephrology: Dr. Dennise 03/10/2024   Procedures:  CT pelvis 02/22/2024 CT L-spine 02/22/2024 CT head CT C-spine 02/22/2024 CT head 03/10/2024 Chest x-ray 02/22/2024 Plain films of the pelvis 02/22/2024 MRI brain 02/24/2024 MRI L-spine 02/22/2024 Renal ultrasound 02/28/2024 CT-guided aspiration of left psoas fluid collection per IR: Dr. Philip 02/24/2024 EEG 03/11/2024 Right upper quadrant ultrasound pending 03/12/2024  Antimicrobials:  Anti-infectives (From admission, onward)    Start     Dose/Rate Route Frequency Ordered Stop   03/11/24 1000  ciprofloxacin  (CIPRO ) IVPB 400 mg        400 mg 200 mL/hr over 60 Minutes Intravenous Every 24 hours 03/10/24 1456     03/10/24 1400  DAPTOmycin  (CUBICIN ) 600 mg in sodium chloride  0.9 % IVPB  8 mg/kg  79.3 kg 124 mL/hr over 30 Minutes Intravenous Every 48 hours 03/09/24 1013     03/10/24 1000  ceFEPIme  (MAXIPIME ) 2 g in sodium chloride  0.9 % 100 mL IVPB  Status:  Discontinued        2 g 200 mL/hr over 30 Minutes Intravenous Every 24 hours 03/09/24 1013 03/10/24 1456    03/07/24 1800  ceFEPIme  (MAXIPIME ) 2 g in sodium chloride  0.9 % 100 mL IVPB  Status:  Discontinued        2 g 200 mL/hr over 30 Minutes Intravenous Every 12 hours 03/07/24 1118 03/09/24 1013   02/28/24 2200  ceFEPIme  (MAXIPIME ) 2 g in sodium chloride  0.9 % 100 mL IVPB  Status:  Discontinued        2 g 200 mL/hr over 30 Minutes Intravenous Every 8 hours 02/28/24 1440 03/07/24 1118   02/28/24 1530  DAPTOmycin  (CUBICIN ) 600 mg in sodium chloride  0.9 % IVPB  Status:  Discontinued        8 mg/kg  79.3 kg 124 mL/hr over 30 Minutes Intravenous Daily 02/28/24 1440 03/09/24 1013   02/28/24 0000  daptomycin  (CUBICIN ) IVPB  Status:  Discontinued        600 mg Intravenous Every 24 hours 02/28/24 1444 03/09/24    02/28/24 0000  ceFEPime  (MAXIPIME ) IVPB  Status:  Discontinued        2 g Intravenous Every 8 hours 02/28/24 1444 03/09/24    02/23/24 2200  vancomycin  (VANCOREADY) IVPB 1500 mg/300 mL  Status:  Discontinued        1,500 mg 150 mL/hr over 120 Minutes Intravenous Every 24 hours 02/22/24 2348 02/23/24 0242   02/23/24 1000  linezolid  (ZYVOX ) IVPB 600 mg  Status:  Discontinued       Note to Pharmacy: Pt developed rash shortly after vancomycin  administration.   600 mg 300 mL/hr over 60 Minutes Intravenous Every 12 hours 02/23/24 0242 02/28/24 1440   02/23/24 0800  piperacillin -tazobactam (ZOSYN ) IVPB 3.375 g  Status:  Discontinued        3.375 g 12.5 mL/hr over 240 Minutes Intravenous Every 8 hours 02/22/24 2348 02/28/24 1440   02/22/24 2315  piperacillin -tazobactam (ZOSYN ) IVPB 3.375 g        3.375 g 100 mL/hr over 30 Minutes Intravenous  Once 02/22/24 2306 02/23/24 0013   02/22/24 2315  vancomycin  (VANCOREADY) IVPB 1500 mg/300 mL        1,500 mg 150 mL/hr over 120 Minutes Intravenous  Once 02/22/24 2306 02/23/24 0141         Subjective: Patient alert and oriented to self.  Knows he is at [place] [facility] but thinks he is in [place] . Knows it is 2026 but thinks it is February.  Knows that  the president is Trump.  Patient denies any chest pain or shortness of breath.  No abdominal pain.  Following commands appropriately.  Patient with some bouts of intermittent confusion.  Daughter at bedside.  Son on speaker phone.     Objective: Vitals:   03/11/24 0522 03/11/24 1354 03/11/24 2121 03/12/24 0609  BP: 136/72 137/78 (!) 154/81 (!) 150/75  Pulse: 77 78 82 78  Resp: 18 14 18 18   Temp: 98.2 F (36.8 C) 98.2 F (36.8 C) 97.6 F (36.4 C) (!) 97.4 F (36.3 C)  TempSrc: Oral Oral Oral Oral  SpO2: 98% 99% 98% 98%  Weight:      Height:        Intake/Output Summary (Last 24 hours) at  03/12/2024 1256 Last data filed at 03/12/2024 1027 Gross per 24 hour  Intake 2484.31 ml  Output 1050 ml  Net 1434.31 ml   Filed Weights   02/22/24 1238 02/24/24 1245  Weight: 79.4 kg 79.3 kg    Examination:  General exam: NAD. Respiratory system: CTAB.  No wheezes, no crackles, no rhonchi.  Fair air movement.  Speaking in full sentences.  No use of accessory muscles of respiration.  Cardiovascular system: Regular rate rhythm no murmurs rubs or gallops.  No JVD.  No pitting lower extremity edema.  Gastrointestinal system: Abdomen is soft, nontender, nondistended, positive bowel sounds.  No rebound.  No guarding.  Central nervous system: Alert.  Moving extremities spontaneously.  No focal neurological deficits. Extremities: Symmetric 5 x 5 power. Skin: No rashes, lesions or ulcers Psychiatry: Judgement and insight poor to fair. Mood & affect appropriate.     Data Reviewed: I have personally reviewed following labs and imaging studies  CBC: Recent Labs  Lab 03/07/24 0350 03/10/24 0352 03/11/24 0357 03/12/24 0412  WBC 6.1 9.7 7.7 6.8  NEUTROABS 3.4 7.3 5.5 4.7  HGB 9.4* 8.3* 7.7* 8.0*  HCT 27.6* 25.5* 23.5* 25.1*  MCV 86.8 90.7 90.4 91.3  PLT 129* 171 178 187    Basic Metabolic Panel: Recent Labs  Lab 03/08/24 0304 03/09/24 0304 03/10/24 0352 03/11/24 0357 03/12/24 0412   NA 133* 137 141 144 142  K 3.6 3.6 3.4* 3.1* 3.2*  CL 97* 102 108 111 112*  CO2 23 20* 19* 19* 18*  GLUCOSE 109* 125* 113* 89 91  BUN 19 32* 42* 41* 37*  CREATININE 1.58* 2.33* 2.60* 2.39* 2.09*  CALCIUM  9.6 8.7* 8.5* 8.8* 9.1  MG 1.9 1.8 1.8 1.9 1.8    GFR: Estimated Creatinine Clearance: 26.8 mL/min (A) (by C-G formula based on SCr of 2.09 mg/dL (H)).  Liver Function Tests: Recent Labs  Lab 03/09/24 0304 03/10/24 0352 03/11/24 0357 03/12/24 0412  AST 36 104* 169* 292*  ALT 13 26 47* 87*  ALKPHOS 103 109 113 88  BILITOT 0.8 0.7 0.7 0.6  PROT 5.7* 5.4* 5.2* 5.2*  ALBUMIN  2.8* 2.7* 2.6* 2.6*    CBG: Recent Labs  Lab 03/11/24 1158 03/11/24 1653 03/11/24 2144 03/12/24 0804 03/12/24 1153  GLUCAP 113* 93 87 77 103*     Recent Results (from the past 240 hours)  MRSA Next Gen by PCR, Nasal     Status: None   Collection Time: 03/11/24  5:49 PM   Specimen: Nasal Mucosa; Nasal Swab  Result Value Ref Range Status   MRSA by PCR Next Gen NOT DETECTED NOT DETECTED Final    Comment: (NOTE) The GeneXpert MRSA Assay (FDA approved for NASAL specimens only), is one component of a comprehensive MRSA colonization surveillance program. It is not intended to diagnose MRSA infection nor to guide or monitor treatment for MRSA infections. Test performance is not FDA approved in patients less than 58 years old. Performed at [facility], [address], [place], [place] [address]          Radiology Studies: EEG adult Result Date: 03/11/2024 Shelton Arlin KIDD, MD     03/11/2024  3:52 PM Patient Name: Charles Lloyd. MRN: [id number] Epilepsy Attending: Arlin KIDD Shelton Referring Physician/Provider: Sebastian Toribio GAILS, MD Date: 03/11/2024 Duration: 24.26 mins Patient history: 84yo M with jerking. EEG to evaluate for seizure Level of alertness: Awake AEDs during EEG study: None Technical aspects: This EEG study was done with  scalp electrodes positioned according  to the 10-20 International system of electrode placement. Electrical activity was reviewed with band pass filter of 1-70Hz , sensitivity of 7 uV/mm, display speed of 86mm/sec with a 60Hz  notched filter applied as appropriate. EEG data were recorded continuously and digitally stored.  Video monitoring was available and reviewed as appropriate. Description: EEG showed continuous generalized 3 to 6 Hz theta-delta slowing, triphasic morphology. Hyperventilation and photic stimulation were not performed.   ABNORMALITY - Continuous slow, generalized IMPRESSION: This study is suggestive of generalized cerebral dysfunction (encephalopathy) likely related to toxic-metabolic etiology. No seizures or epileptiform discharges were seen throughout the recording. Priyanka MALVA Krebs   CT HEAD WO CONTRAST ( ) Result Date: 03/10/2024 CLINICAL DATA:  Delirium acute change EXAM: CT HEAD WITHOUT CONTRAST TECHNIQUE: Contiguous axial images were obtained from the base of the skull through the vertex without intravenous contrast. RADIATION DOSE REDUCTION: This exam was performed according to the departmental dose-optimization program which includes automated exposure control, adjustment of the mA and/or kV according to patient size and/or use of iterative reconstruction technique. COMPARISON:  MRI 02/24/2024, CT brain 02/22/2024 FINDINGS: Brain: No acute territorial infarction, hemorrhage or intracranial mass. Moderate atrophy. Mild chronic small vessel ischemic changes of the white matter. Stable ventricular size Vascular: No hyperdense vessels.  Carotid vascular calcification Skull: Normal. Negative for fracture or focal lesion. Sinuses/Orbits: No acute finding. Other: None IMPRESSION: 1. No CT evidence for acute intracranial abnormality. 2. Atrophy and chronic small vessel ischemic changes of the white matter. Electronically Signed   By: Luke Bun M.D.   On: 03/10/2024 19:34        Scheduled Meds:  allopurinol   100 mg Oral  Daily   apixaban   2.5 mg Oral BID   carvedilol   3.125 mg Oral BID   Chlorhexidine  Gluconate Cloth  6 each Topical Daily   cycloSPORINE   1 drop Both Eyes Daily   flecainide   50 mg Oral BID   folic acid   1 mg Intravenous Daily   gabapentin   300 mg Oral TID   hydrALAZINE   25 mg Oral BID   hydrocortisone  cream   Topical TID   insulin  aspart  0-6 Units Subcutaneous TID AC & HS   montelukast   10 mg Oral QHS   potassium chloride   40 mEq Oral Q4H   sodium chloride  flush  3 mL Intravenous Q12H   tamsulosin   0.4 mg Oral QPC supper   thiamine   100 mg Oral Daily   Continuous Infusions:  ciprofloxacin  400 mg (03/12/24 0910)   DAPTOmycin  Stopped (03/10/24 1515)   dextrose  5 % and 0.9 % NaCl 75 mL/hr at 03/12/24 1025   lactated ringers  Stopped (03/12/24 1022)     LOS: 19 days    Time spent: 40 minutes    Toribio Hummer, MD [facility]   To contact the attending provider between 7A-7P or the covering provider during after hours 7P-7A, please log into the web site [email or web address] and access using universal [place] password for that web site. If you do not have the password, please call the hospital operator.  03/12/2024, 12:56 PM    "

## 2024-03-12 NOTE — Progress Notes (Signed)
 Occupational Therapy Treatment Patient Details Name: Charles Lloyd. MRN: [id number] DOB: 1941-02-05 Today's Date: 03/12/2024   History of present illness Charles Lloyd. is a 84 yr old male who presented to the emergency department on 02/22/2024 for leg pain and weakness, with family reporting multiple recent falls.  MRI lumbar spine revealed a new 3.5 x 2.4 cm fluid collection in the left psoas muscle concerning for abscess.  Neurosurgery was consulted at that time and recommended IV antibiotics and possible aspiration of abscess.  PMH of hypertension, hyperlipidemia, type 2 diabetes, atrial fibrillation on anticoagulation, s/p recent L4-5 trans psoas lumbar interbody fusion left-sided approach on 02/06/2024, CHF, gout,CAD, TKA with infection, neuropathy   OT comments  The pt was received in the bed. He was alert and agreeable to therapy participation. He was seen for functional strengthening and progression of out of bed activity, needed to prep him for progressive ADL participation. He required increased max assist for bed mobility then mod-max assist using a RW to step-pivot to the bedside chair. Once he was assisted to the bedside chair, OT attempted to have him participate in self-care tasks, such as grooming, however he deferred, stating his son would help him with such tasks. The pt was conversational during the session, though often about unrelated topics. He also required increased redirection to tasks, as he could be distractible, with memory lapses, and intermittently confused. He reported the year to be 1025  and stated he was at [facility]. Continue OT plan of care to address his compromised ADL performance and functional limitations. Patient will benefit from continued inpatient follow up therapy, <3 hours/day.       If plan is discharge home, recommend the following:  A lot of help with walking and/or transfers;A lot of help with bathing/dressing/bathroom;Help with stairs  or ramp for entrance;Assistance with cooking/housework;Assist for transportation;Direct supervision/assist for medications management;Direct supervision/assist for financial management   Equipment Recommendations  Other (comment) (defer to next setting)    Recommendations for Other Services      Precautions / Restrictions Precautions Precautions: Fall Restrictions Weight Bearing Restrictions Per Provider Order: No       Mobility Bed Mobility Overal bed mobility: Needs Assistance       Supine to sit: Max assist, HOB elevated, Used rails     General bed mobility comments: pt required increased time and effort, as well as increased cues for sequencing/transfer technique and attention to tasks, in order perform supine to sit and scooting to the edge of the bed    Transfers Overall transfer level: Needs assistance Equipment used: Rolling walker (2 wheels) Transfers: Sit to/from Stand Sit to Stand: Max assist     Step pivot transfers: Mod assist     General transfer comment: Pt required multi-modal cues for proper hand placement on the bed & walker, BLE positioning on the floor and for trunk extension in standing. Once in standing, he required cues for walker advancement and to control descent when sitting down in the chair     Balance     Sitting balance-Leahy Scale: Fair       Standing balance-Leahy Scale: Poor          ADL either performed or assessed with clinical judgement   ADL Overall ADL's : Needs assistance/impaired      General ADL Comments: based on clinical judgement, the pt requires approximately maximal assistance for lower body dressing and toileting management. Once the pt was assisted to the bedside chair,  OT attempted to have him participate in self-care tasks, such as grooming, however he deferred, stating his son would help him with such tasks.      Cognition Arousal: Alert   Cognition: Cognition impaired   Orientation impairments: Time,  Situation, Place Awareness: Online awareness impaired Memory impairment (select all impairments): Short-term memory, Working civil service fast streamer, Conservation officer, historic buildings Attention impairment (select first level of impairment): Divided attention, Sustained attention Executive functioning impairment (select all impairments): Problem solving, Organization, Initiation, Sequencing                   Following commands: Impaired Following commands impaired: Follows one step commands inconsistently      Cueing   Cueing Techniques: Verbal cues, Gestural cues, Tactile cues             Pertinent Vitals/ Pain       Pain Assessment Pain Assessment: Faces Pain Score: 3  Pain Location: generalized back, B LE Pain Descriptors / Indicators: Sore Pain Intervention(s): Limited activity within patient's tolerance, Monitored during session, Repositioned   Frequency  Min 2X/week        Progress Toward Goals  OT Goals(current goals can now be found in the care plan section)     Acute Rehab OT Goals Patient Stated Goal: he did not specifically state this date OT Goal Formulation: With patient Time For Goal Achievement: 03/26/24 Potential to Achieve Goals: Good ADL Goals Pt Will Perform Lower Body Dressing: with min assist;sit to/from stand;sitting/lateral leans;with adaptive equipment Pt Will Transfer to Toilet: with min assist;ambulating;grab bars Pt Will Perform Toileting - Clothing Manipulation and hygiene: with min assist;sit to/from stand  Plan      AM-PAC OT 6 Clicks Daily Activity     Outcome Measure   Help from another person eating meals?: A Little Help from another person taking care of personal grooming?: A Little Help from another person toileting, which includes using toliet, bedpan, or urinal?: A Lot Help from another person bathing (including washing, rinsing, drying)?: A Lot Help from another person to put on and taking off regular upper body clothing?: A Little Help  from another person to put on and taking off regular lower body clothing?: A Lot 6 Click Score: 15    End of Session Equipment Utilized During Treatment: Rolling walker (2 wheels);Gait belt  OT Visit Diagnosis: Unsteadiness on feet (R26.81);Other abnormalities of gait and mobility (R26.89);Muscle weakness (generalized) (M62.81);Other symptoms and signs involving cognitive function Pain - part of body:  (generalized soreness)   Activity Tolerance Other (comment) (Fair tolerance)   Patient Left in chair;with call bell/phone within reach;with chair alarm set   Nurse Communication Mobility status        Time: 8368-8352 OT Time Calculation (min): 16 min  Charges: OT General Charges $OT Visit: 1 Visit OT Treatments $Therapeutic Activity: 8-22 mins    Delanna JINNY Lesches, OTR/L 03/12/2024, 5:42 PM

## 2024-03-12 NOTE — Progress Notes (Signed)
 "                                                                                                                                                                                                          Daily Progress Note   Patient Name: Charles Lloyd.       Date: 03/12/2024 DOB: 07-Aug-1940  Age: 84 y.o. MRN#: [id number] Attending Physician: Sebastian Toribio GAILS, MD Primary Care Physician: Theophilus Andrews, Tully GRADE, MD Admit Date: 02/22/2024  Reason for Follow-up: Establishing goals of care  Patient Profile/HPI  84 y.o. male  with past medical history of recent lumbar surgery, HTN, hyperlipidemia, DM2, a-fib on anticoagulation, CHF, gout, CAD, neuropathy admitted on 02/22/2024 with leg pain and weakness. MRI revealed psoas abscess and he underwent aspiration of abscess on 12/29.  Admission has been complicated by altered mental status- initially altered after receiving lorazepam  for MRI on admission, but continued to have agitation. There was concern for alcohol  withdrawal vs benzo related delirium and he was transferred to the ICU, started on precedex  and completed phenobarbitol taper. Mental status returned to baseline around 12/31 and discharge planning was in process. CIR was recommended, but insurance denied. He developed some confusion around 1/8, and 1/9. He was started on Rozerem  by psych on 1/9 which has been discontinued. Palliative medicine initial consult completed 1/10.  1/10-1/13- patient with acute mental status change- somnolent, unable to eat or drink, nonverbal.  1/14- improved mental status, CT head negative, started on pureed diet I am seeing patient today 1/15 for continued followup.   Discussion: Labs reviewed 1/15- CMET- Cr better today 2.09. Transaminases are trending up- attending has ordered abdominal ultrasound. EEG with no seizures- did show generalized encephalopathy.  Per daughter Vertell- patient mental status improved today. He is asking about paying his bills, is  able to recall some of his confusion.  On eval patient is awake and alert. His speech is more clear today than yesterday. He is repeating, I don't know what's going on. I attempted to orient him. He asked me about his daughter and his notebook- I reassured him that his daughter is close by and has his items.   Review of Systems  Unable to perform ROS: Mental status change     Physical Exam Vitals and nursing note reviewed.  Constitutional:      General: He is not in acute distress. Cardiovascular:     Rate and Rhythm: Normal rate.  Pulmonary:     Effort: Pulmonary effort is normal.  Neurological:     Mental  Status: He is disoriented.             Vital Signs: BP (!) 150/75 (BP Location: Left Arm)   Pulse 78   Temp (!) 97.4 F (36.3 C) (Oral)   Resp 18   Ht 5' 8 (1.727 m)   Wt 79.3 kg   SpO2 98%   BMI 26.58 kg/m  SpO2: SpO2: 98 % O2 Device: O2 Device: Room Air O2 Flow Rate: O2 Flow Rate (L/min): 3 L/min  Intake/output summary:  Intake/Output Summary (Last 24 hours) at 03/12/2024 1129 Last data filed at 03/12/2024 1027 Gross per 24 hour  Intake 2484.31 ml  Output 1050 ml  Net 1434.31 ml   LBM: Last BM Date : 03/11/24 Baseline Weight: Weight: 79.4 kg Most recent weight: Weight: 79.3 kg       Palliative Assessment/Data: PPS: 30%      Patient Active Problem List   Diagnosis Date Noted   Peripheral neuropathy 03/11/2024   Stress reaction causing mixed disturbance of emotion and conduct 03/06/2024   Acute on chronic urinary retention 02/29/2024   Pressure injury 02/27/2024   Altered mental status 02/24/2024   Alcohol  withdrawal syndrome, with delirium (HCC) 02/24/2024   Delirium 02/24/2024   Psoas abscess, left (HCC) 02/22/2024   Elective surgery 02/06/2024   Bilateral leg pain 11/29/2023   Acute posthemorrhagic anemia 06/21/2022   Bilateral lower extremity edema 04/06/2022   Coronary artery disease involving native coronary artery of native heart without  angina pectoris 04/05/2022   Gout 02/27/2022   Acute renal failure superimposed on stage 3b chronic kidney disease (HCC) 11/16/2021   Uremia 11/16/2021   Gout due to renal impairment 10/17/2021   Pancytopenia (HCC) 08/05/2021   SIRS (systemic inflammatory response syndrome) (HCC) 08/04/2021   Chronic kidney disease, stage 3b (HCC) 12/30/2020   Recurrent falls    Spinal stenosis    S/P TKR (total knee replacement), bilateral    Arthralgia 11/20/2019   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   Lesion of liver 04/20/2019   Secondary hypercoagulable state 04/09/2019   Eosinophil count raised 02/17/2019   Acute tracheobronchitis 01/05/2019   Asthma    Hyponatremia 12/17/2018   Vitamin D  deficiency 07/25/2018   Chronic anticoagulation 03/06/2018   Hyperlipidemia associated with type 2 diabetes mellitus (HCC) 01/28/2018   Basal cell carcinoma (BCC) of left side of nose 01/28/2018   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   Artificial knee joint present 05/20/2017   Left knee pain 05/07/2017   B12 deficiency 10/22/2016   Overweight (BMI 25.0-29.9) 10/22/2016   Primary osteoarthritis of right knee 03/29/2015   Osteoarthritis of right knee 03/29/2015   History of cholecystectomy 11/20/2014   Occult blood in stools 02/09/2014   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   Paroxysmal A-fib (HCC) 04/16/2013   Chronic heart failure with preserved ejection fraction (HFpEF) (HCC) 04/16/2013   Colon polyps 04/16/2013   NIDDM-2 with polyneuropathy and hyperglycemia 04/16/2013   Tinea cruris 04/16/2013   Diabetic neuropathy (HCC) 07/14/2012   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) 07/14/2012   Insomnia 03/04/2012   History of cardiovascular disorder 08/16/2011   Hypertensive heart disease with heart failure (HCC) 01/06/2010   Neoplasm of prostate, malignant (HCC) 01/06/2010   Hypercholesterolemia 01/06/2010   Essential hypertension 01/06/2010    Palliative Care Assessment & Plan     Assessment/Recommendations/Plan  Admitted with psoas abscess after surgical procedure- recovery complicated by ETOH withdrawal/delirium which initially improved after phenobarbital  taper, however, patient had acute change again with hypoactive delirium  symptoms- thus far etiology is unclear- improving today, continue current interventions Code status changed to DNR- pre- arrest interventions desired  Recommend repeat eval by SLP for possible diet upgrade due to improved mental status   Code Status:   Code Status: Do not attempt resuscitation (DNR) PRE-ARREST INTERVENTIONS DESIRED   Prognosis:  Unable to determine  Discharge Planning: To Be Determined  Thank you for allowing the Palliative Medicine Team to assist in the care of this patient.  I personally spent a total of 45 minutes in the care of the patient today including preparing to see the patient, getting/reviewing separately obtained history, performing a medically appropriate exam/evaluation, counseling and educating, referring and communicating with other health care professionals, and documenting clinical information in the EHR.   Cassondra Stain, AGNP-C Palliative Medicine   Please contact Palliative Medicine Team phone at [phone] for questions and concerns.        "

## 2024-03-13 DIAGNOSIS — K6812 Psoas muscle abscess: Secondary | ICD-10-CM | POA: Diagnosis not present

## 2024-03-13 DIAGNOSIS — E871 Hypo-osmolality and hyponatremia: Secondary | ICD-10-CM | POA: Diagnosis not present

## 2024-03-13 DIAGNOSIS — R296 Repeated falls: Secondary | ICD-10-CM | POA: Diagnosis not present

## 2024-03-13 DIAGNOSIS — F10231 Alcohol dependence with withdrawal delirium: Secondary | ICD-10-CM | POA: Diagnosis not present

## 2024-03-13 DIAGNOSIS — R339 Retention of urine, unspecified: Secondary | ICD-10-CM | POA: Diagnosis not present

## 2024-03-13 DIAGNOSIS — K760 Fatty (change of) liver, not elsewhere classified: Secondary | ICD-10-CM

## 2024-03-13 DIAGNOSIS — R7401 Elevation of levels of liver transaminase levels: Secondary | ICD-10-CM | POA: Diagnosis not present

## 2024-03-13 DIAGNOSIS — G6289 Other specified polyneuropathies: Secondary | ICD-10-CM | POA: Diagnosis not present

## 2024-03-13 DIAGNOSIS — I480 Paroxysmal atrial fibrillation: Secondary | ICD-10-CM | POA: Diagnosis not present

## 2024-03-13 DIAGNOSIS — N179 Acute kidney failure, unspecified: Secondary | ICD-10-CM | POA: Diagnosis not present

## 2024-03-13 DIAGNOSIS — J4530 Mild persistent asthma, uncomplicated: Secondary | ICD-10-CM | POA: Diagnosis not present

## 2024-03-13 DIAGNOSIS — E1142 Type 2 diabetes mellitus with diabetic polyneuropathy: Secondary | ICD-10-CM | POA: Diagnosis not present

## 2024-03-13 DIAGNOSIS — R748 Abnormal levels of other serum enzymes: Secondary | ICD-10-CM | POA: Diagnosis not present

## 2024-03-13 DIAGNOSIS — F10931 Alcohol use, unspecified with withdrawal delirium: Secondary | ICD-10-CM | POA: Diagnosis not present

## 2024-03-13 DIAGNOSIS — R410 Disorientation, unspecified: Secondary | ICD-10-CM | POA: Diagnosis not present

## 2024-03-13 DIAGNOSIS — I5032 Chronic diastolic (congestive) heart failure: Secondary | ICD-10-CM | POA: Diagnosis not present

## 2024-03-13 DIAGNOSIS — I10 Essential (primary) hypertension: Secondary | ICD-10-CM | POA: Diagnosis not present

## 2024-03-13 LAB — PROTEIN ELECTROPHORESIS, SERUM
A/G Ratio: 0.8 (ref 0.7–1.7)
Albumin ELP: 2.1 g/dL — ABNORMAL LOW (ref 2.9–4.4)
Alpha-1-Globulin: 0.4 g/dL (ref 0.0–0.4)
Alpha-2-Globulin: 0.8 g/dL (ref 0.4–1.0)
Beta Globulin: 0.6 g/dL — ABNORMAL LOW (ref 0.7–1.3)
Gamma Globulin: 0.7 g/dL (ref 0.4–1.8)
Globulin, Total: 2.5 g/dL (ref 2.2–3.9)
Total Protein ELP: 4.6 g/dL — ABNORMAL LOW (ref 6.0–8.5)

## 2024-03-13 LAB — CBC
HCT: 26.9 % — ABNORMAL LOW (ref 39.0–52.0)
Hemoglobin: 8.5 g/dL — ABNORMAL LOW (ref 13.0–17.0)
MCH: 29 pg (ref 26.0–34.0)
MCHC: 31.6 g/dL (ref 30.0–36.0)
MCV: 91.8 fL (ref 80.0–100.0)
Platelets: 195 K/uL (ref 150–400)
RBC: 2.93 MIL/uL — ABNORMAL LOW (ref 4.22–5.81)
RDW: 18 % — ABNORMAL HIGH (ref 11.5–15.5)
WBC: 7.2 K/uL (ref 4.0–10.5)
nRBC: 0 % (ref 0.0–0.2)

## 2024-03-13 LAB — HEPATITIS PANEL, ACUTE
HCV Ab: REACTIVE — AB
Hep A IgM: NONREACTIVE
Hep B C IgM: NONREACTIVE
Hepatitis B Surface Ag: NONREACTIVE

## 2024-03-13 LAB — GLUCOSE, CAPILLARY
Glucose-Capillary: 109 mg/dL — ABNORMAL HIGH (ref 70–99)
Glucose-Capillary: 156 mg/dL — ABNORMAL HIGH (ref 70–99)
Glucose-Capillary: 134 mg/dL — ABNORMAL HIGH (ref 70–99)
Glucose-Capillary: 118 mg/dL — ABNORMAL HIGH (ref 70–99)

## 2024-03-13 LAB — COMPREHENSIVE METABOLIC PANEL WITH GFR
ALT: 129 U/L — ABNORMAL HIGH (ref 0–44)
AST: 369 U/L — ABNORMAL HIGH (ref 15–41)
Albumin: 2.5 g/dL — ABNORMAL LOW (ref 3.5–5.0)
Alkaline Phosphatase: 98 U/L (ref 38–126)
Anion gap: 10 (ref 5–15)
BUN: 32 mg/dL — ABNORMAL HIGH (ref 8–23)
CO2: 21 mmol/L — ABNORMAL LOW (ref 22–32)
Calcium: 9 mg/dL (ref 8.9–10.3)
Chloride: 110 mmol/L (ref 98–111)
Creatinine, Ser: 1.79 mg/dL — ABNORMAL HIGH (ref 0.61–1.24)
GFR, Estimated: 38 mL/min — ABNORMAL LOW
Glucose, Bld: 120 mg/dL — ABNORMAL HIGH (ref 70–99)
Potassium: 3.1 mmol/L — ABNORMAL LOW (ref 3.5–5.1)
Sodium: 141 mmol/L (ref 135–145)
Total Bilirubin: 0.6 mg/dL (ref 0.0–1.2)
Total Protein: 5.3 g/dL — ABNORMAL LOW (ref 6.5–8.1)

## 2024-03-13 MED ORDER — SODIUM CHLORIDE 0.9 % IV SOLN
1.0000 g | INTRAVENOUS | Status: DC
  Administered 2024-03-13 – 2024-03-28 (×16): 1 g via INTRAVENOUS
  Filled 2024-03-13 (×17): qty 1000

## 2024-03-13 MED ORDER — LACTATED RINGERS IV SOLN: INTRAVENOUS | Status: AC

## 2024-03-13 MED ORDER — SODIUM CHLORIDE 0.9 % IV SOLN
8.0000 mg/kg | Freq: Every day | INTRAVENOUS | Status: DC
  Administered 2024-03-13: 600 mg via INTRAVENOUS
  Filled 2024-03-13 (×2): qty 12

## 2024-03-13 MED ORDER — POTASSIUM CHLORIDE 20 MEQ PO PACK
40.0000 meq | PACK | Freq: Once | ORAL | Status: AC
  Administered 2024-03-13: 40 meq via ORAL
  Filled 2024-03-13: qty 2

## 2024-03-13 NOTE — Progress Notes (Signed)
 "                                                                                                                                                                                                          Daily Progress Note   Patient Name: Charles Lloyd.       Date: 03/13/2024 DOB: 1940/07/29  Age: 84 y.o. MRN#: [id number] Attending Physician: Sebastian Toribio GAILS, MD Primary Care Physician: Theophilus Andrews, Tully GRADE, MD Admit Date: 02/22/2024  Reason for Follow-up: Establishing goals of care  Patient Profile/HPI  84 y.o. male  with past medical history of recent lumbar surgery, HTN, hyperlipidemia, DM2, a-fib on anticoagulation, CHF, gout, CAD, neuropathy admitted on 02/22/2024 with leg pain and weakness. MRI revealed psoas abscess and he underwent aspiration of abscess on 12/29.  Admission has been complicated by altered mental status- initially altered after receiving lorazepam  for MRI on admission, but continued to have agitation. There was concern for alcohol  withdrawal vs benzo related delirium and he was transferred to the ICU, started on precedex  and completed phenobarbitol taper. Mental status returned to baseline around 12/31 and discharge planning was in process. CIR was recommended, but insurance denied. He developed some confusion around 1/8, and 1/9. He was started on Rozerem  by psych on 1/9 which has been discontinued. Palliative medicine initial consult completed 1/10.  1/10-1/13- patient with acute mental status change- somnolent, unable to eat or drink, nonverbal.  1/14- improved mental status, CT head negative, started on pureed diet I am seeing patient today 1/16 for continued followup.   Discussion: Labs reviewed 1/16- CMET- Cr better today 1.79. Transaminases are continuing to trend up- AST/ALT- 369/129.  Abdominal ultrasound reviewed-  slightly echogenic liver suggesting hepatic steatosis or hepatocellular disease. Hepatitis panel nonreactive for hep B, hep A. HCV is  pending.  On eval- patient sitting up, awake, alert. Attempting to use room phone to call his son- I assisted him with dialing. Speech much clearer today.   SLP note reviewed- diet upgraded, able to feed himself.    Review of Systems  Unable to perform ROS: Mental status change     Physical Exam Vitals and nursing note reviewed.  Constitutional:      General: He is not in acute distress.    Appearance: He is not ill-appearing.  Cardiovascular:     Rate and Rhythm: Normal rate.  Pulmonary:     Effort: Pulmonary effort is normal.  Neurological:     Mental Status: He is disoriented.  Psychiatric:     Comments: pleasant  Vital Signs: BP 138/73 (BP Location: Left Arm)   Pulse 84   Temp 97.7 F (36.5 C) (Oral)   Resp 14   Ht 5' 8 (1.727 m)   Wt 79.3 kg   SpO2 99%   BMI 26.58 kg/m  SpO2: SpO2: 99 % O2 Device: O2 Device: Room Air O2 Flow Rate: O2 Flow Rate (L/min): 3 L/min  Intake/output summary:  Intake/Output Summary (Last 24 hours) at 03/13/2024 1529 Last data filed at 03/13/2024 9360 Gross per 24 hour  Intake 1595.87 ml  Output 1150 ml  Net 445.87 ml   LBM: Last BM Date : 03/11/24 Baseline Weight: Weight: 79.4 kg Most recent weight: Weight: 79.3 kg       Palliative Assessment/Data: PPS: 40%      Patient Active Problem List   Diagnosis Date Noted   Peripheral neuropathy 03/11/2024   Stress reaction causing mixed disturbance of emotion and conduct 03/06/2024   Acute on chronic urinary retention 02/29/2024   Pressure injury 02/27/2024   Altered mental status 02/24/2024   Alcohol  withdrawal syndrome, with delirium (HCC) 02/24/2024   Delirium 02/24/2024   Psoas abscess, left (HCC) 02/22/2024   Elective surgery 02/06/2024   Bilateral leg pain 11/29/2023   Acute posthemorrhagic anemia 06/21/2022   Bilateral lower extremity edema 04/06/2022   Coronary artery disease involving native coronary artery of native heart without angina pectoris  04/05/2022   Gout 02/27/2022   Acute renal failure superimposed on stage 3b chronic kidney disease (HCC) 11/16/2021   Uremia 11/16/2021   Gout due to renal impairment 10/17/2021   Pancytopenia (HCC) 08/05/2021   SIRS (systemic inflammatory response syndrome) (HCC) 08/04/2021   Chronic kidney disease, stage 3b (HCC) 12/30/2020   Recurrent falls    Spinal stenosis    S/P TKR (total knee replacement), bilateral    Arthralgia 11/20/2019   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   Lesion of liver 04/20/2019   Secondary hypercoagulable state 04/09/2019   Eosinophil count raised 02/17/2019   Acute tracheobronchitis 01/05/2019   Asthma    Hyponatremia 12/17/2018   Vitamin D  deficiency 07/25/2018   Chronic anticoagulation 03/06/2018   Hyperlipidemia associated with type 2 diabetes mellitus (HCC) 01/28/2018   Basal cell carcinoma (BCC) of left side of nose 01/28/2018   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   Artificial knee joint present 05/20/2017   Left knee pain 05/07/2017   B12 deficiency 10/22/2016   Overweight (BMI 25.0-29.9) 10/22/2016   Primary osteoarthritis of right knee 03/29/2015   Osteoarthritis of right knee 03/29/2015   History of cholecystectomy 11/20/2014   Occult blood in stools 02/09/2014   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   Paroxysmal A-fib (HCC) 04/16/2013   Chronic heart failure with preserved ejection fraction (HFpEF) (HCC) 04/16/2013   Colon polyps 04/16/2013   NIDDM-2 with polyneuropathy and hyperglycemia 04/16/2013   Tinea cruris 04/16/2013   Diabetic neuropathy (HCC) 07/14/2012   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) 07/14/2012   Insomnia 03/04/2012   History of cardiovascular disorder 08/16/2011   Hypertensive heart disease with heart failure (HCC) 01/06/2010   Neoplasm of prostate, malignant (HCC) 01/06/2010   Hypercholesterolemia 01/06/2010   Essential hypertension 01/06/2010    Palliative Care Assessment & Plan     Assessment/Recommendations/Plan  Admitted with psoas abscess after surgical procedure- recovery complicated by ETOH withdrawal/delirium which initially improved after phenobarbital  taper, however, patient had acute change again with hypoactive delirium symptoms- thus far etiology is unclear- continuing to improve Elevated transaminases- attending Dr. Sebastian is working up per  review of his note Code status is DNR- pre- arrest interventions desired  Appreciate repeat eval by SLP- diet upgraded GOC continue to be for ongoing medical interventions PMT will follow intermittently as patient now improving and goals clarified- please call if acute needs arise  Code Status:   Code Status: Do not attempt resuscitation (DNR) PRE-ARREST INTERVENTIONS DESIRED   Prognosis:  Unable to determine  Discharge Planning: To Be Determined- likely SNF  Thank you for allowing the Palliative Medicine Team to assist in the care of this patient.  Billing based on MDM: Moderate  Problems Addressed: One acute or chronic illness or injury that poses a threat to life or bodily function  Amount and/or Complexity of Data: Category 1:Review of prior external note(s) from each unique source and Review of the result(s) of each unique test   Cassondra Stain, AGNP-C Palliative Medicine   Please contact Palliative Medicine Team phone at [phone] for questions and concerns.        "

## 2024-03-13 NOTE — Progress Notes (Signed)
 Physical Therapy Treatment Patient Details Name: Charles Lloyd. MRN: [id number] DOB: 1941/01/13 Today's Date: 03/13/2024   History of Present Illness Charles Lloyd. is a 84 y.o. male who presented to the emergency department on 02/22/2024 for leg pain and weakness, with family reporting multiple falls over the past 6 to 8 months.  MRI lumbar spine revealed a new 3.5 x 2.4 cm fluid collection in the left psoas muscle concerning for abscess.  Neurosurgery was consulted at that time and recommended IV antibiotics and possible aspiration of abscess.  with significant PMH of hypertension, hyperlipidemia, type 2 diabetes, atrial fibrillation on anticoagulation, s/p recent L4-5 trans psoas lumbar interbody fusion left-sided approach on 02/06/2024, CHF,gout,CAD, TKA with infection, neuropathy    PT Comments   Pt admitted with above diagnosis.  Pt currently with functional limitations due to the deficits listed below (see PT Problem List). Pt exhibits improved alertness and communication with increased time for all motor processing and planning as well as verbal response. Pt seated in recliner and son present. Pt required ma x A x 2 for sit to stand  from recliner, max/mod A x 2 for gait 8 feet with RW, strong posterior lean, shuffling pattern and A for RW management with recliner close, second trial with sit to stand  from recliner with increased ease and improved standing balance. Pt returned to recliner, all needs in place and son in hallway. Patient will benefit from continued inpatient follow up therapy, <3 hours/day. Pt will benefit from acute skilled PT to increase their independence and safety with mobility to allow discharge.      If plan is discharge home, recommend the following: Assist for transportation;Help with stairs or ramp for entrance;Assistance with cooking/housework;A lot of help with bathing/dressing/bathroom;Two people to help with walking and/or transfers   Can travel by private  vehicle     No  Equipment Recommendations  None recommended by PT    Recommendations for Other Services       Precautions / Restrictions Precautions Precautions: Fall Recall of Precautions/Restrictions: Impaired Precaution/Restrictions Comments: trunk flexion at baseline Restrictions Weight Bearing Restrictions Per Provider Order: No     Mobility  Bed Mobility               General bed mobility comments: pt seated in recliner when PT arrived    Transfers Overall transfer level: Needs assistance Equipment used: Rolling walker (2 wheels) Transfers: Sit to/from Stand Sit to Stand: Max assist, +2 physical assistance, +2 safety/equipment           General transfer comment: A with use of bed pad to scoot anteriorly in recliner, max A x 2 for sit to stand from recliner with strong posterior lean and R foot blocked, pt unable to modify posture and weight shift anteriorly regardless of mulitmodal cues and PT change in hand and body position, second trial with sit to stand much improved mod A x 2 to complete task with pt initiating scooting anteriorly in recliner however use of bed pad to complete task, slight posterior lean and with pt able to progress to min A x 2 for balance once in standing, pt continues to be limited with cervical and trunk extension--baseline    Ambulation/Gait Ambulation/Gait assistance: +2 physical assistance, +2 safety/equipment, Mod assist, Max assist Gait Distance (Feet): 8 Feet Assistive device: Rolling walker (2 wheels) Gait Pattern/deviations: Decreased stride length, Trunk flexed, Ataxic, Step-to pattern, Shuffle, Leaning posteriorly Gait velocity: Decreased     General Gait  Details: B UE support at RW, mod/max A x 2 for balance due to strong posterior lean, A for RW management and recliner close   Stairs             Wheelchair Mobility     Tilt Bed    Modified Rankin (Stroke Patients Only)       Balance Overall balance  assessment: Needs assistance, History of Falls Sitting-balance support: Feet supported Sitting balance-Leahy Scale: Fair     Standing balance support: Bilateral upper extremity supported, During functional activity, Reliant on assistive device for balance Standing balance-Leahy Scale: Poor Standing balance comment: mod A to max A x 2 with strong posterior lean and able to progress to min A x 2 at [employer] Communication Communication: Impaired Factors Affecting Communication: Difficulty expressing self (increased time for verbal response, not always appropriate)  Cognition Arousal: Alert Behavior During Therapy: Flat affect   PT - Cognitive impairments: Safety/Judgement, Sequencing, Problem solving, Awareness, Memory, Attention, Initiation                       PT - Cognition Comments: pt is able to follow one step commands with increased time today Following commands: Impaired Following commands impaired: Follows one step commands with increased time, Only follows one step commands consistently    Cueing Cueing Techniques: Verbal cues, Gestural cues, Tactile cues, Visual cues  Exercises      General Comments General comments (skin integrity, edema, etc.): Son present for a portion of tx intervention and indicated that pt is doing better vs earlier this week      Pertinent Vitals/Pain Pain Assessment Pain Assessment: No/denies pain    Home Living                          Prior Function            PT Goals (current goals can now be found in the care plan section) Acute Rehab PT Goals Patient Stated Goal: To be able to manage at home alone. Progress towards PT goals: Progressing toward goals    Frequency    Min 3X/week      PT Plan      Co-evaluation              AM-PAC PT 6 Clicks Mobility   Outcome Measure  Help needed turning from your back to your side while in a flat bed without  using bedrails?: Total Help needed moving from lying on your back to sitting on the side of a flat bed without using bedrails?: Total Help needed moving to and from a bed to a chair (including a wheelchair)?: Total Help needed standing up from a chair using your arms (e.g., wheelchair or bedside chair)?: Total Help needed to walk in hospital room?: Total Help needed climbing 3-5 steps with a railing? : Total 6 Click Score: 6    End of Session Equipment Utilized During Treatment: Gait belt Activity Tolerance: Other (comment);Patient limited by fatigue (cognition) Patient left: in chair;with call bell/phone within reach;with chair alarm set;with family/visitor present Nurse Communication: Mobility status;Need for lift equipment PT Visit Diagnosis: Muscle weakness (generalized) (M62.81);Difficulty in walking, not elsewhere classified (R26.2)     Time: 8569-8554 PT Time Calculation (min) (ACUTE ONLY): 15 min  Charges:    $  Therapeutic Activity: 8-22 mins PT General Charges $$ ACUTE PT VISIT: 1 Visit                     [place], PT Acute Rehab    [place] VEAR Drone 03/13/2024, 4:21 PM

## 2024-03-13 NOTE — Progress Notes (Signed)
 Speech Language Pathology Treatment: Dysphagia  Patient Details Name: Charles Lloyd. MRN: [id number] DOB: 20-Dec-1940 Today's Date: 03/13/2024 Time: 9046-8994 SLP Time Calculation (min) (ACUTE ONLY): 12 min  Assessment / Plan / Recommendation Clinical Impression  Pt is much more attentive to functional tasks and fed himself with setup assistance. He quickly initiates oral transit and clears his mouth completely. No signs clinically concerning for aspiration were observed with thin liquids. Will advance to Dys 3 solids and continue thin liquids with full supervision for meals. Limit distractions as much as possible. SLP will f/u at least briefly but given improvements related to mentation, do not anticipate post-acute needs.    HPI HPI: Charles Lloyd. is a 84 y.o. male who presented to the emergency department on 02/22/2024 for leg pain and weakness, with family reporting multiple falls over the past 6 to 8 months.  MRI lumbar spine revealed a new 3.5 x 2.4 cm fluid collection in the left psoas muscle concerning for abscess.  Neurosurgery was consulted at that time and recommended IV antibiotics and possible aspiration of abscess. Recent change in MS precipitated swallowing consult. PMH HTN, hyperlipidemia, DM2, atrial fibrillation on anticoagulation, s/p recent L4-5 trans psoas lumbar interbody fusion left-sided approach on 02/06/2024, CHF,gout,CAD, TKA with infection, neuropathy      SLP Plan  Continue with current plan of care        Swallow Evaluation Recommendations   Recommendations: PO diet PO Diet Recommendation: Dysphagia 3 (Mechanical soft);Thin liquids (Level 0) Liquid Administration via: Cup;Straw Medication Administration: Whole meds with puree Supervision: Full assist for feeding;Full supervision/cueing for swallowing strategies Postural changes: Position pt fully upright for meals Oral care recommendations: Oral care BID (2x/day)     Recommendations                      Oral care BID   Set up Supervision/Assistance Dysphagia, unspecified (R13.10)     Continue with current plan of care     Damien Blumenthal, M.A., CCC-SLP Speech Language Pathology, Acute Rehabilitation Services  Secure Chat preferred [phone]   03/13/2024, 10:49 AM

## 2024-03-13 NOTE — Care Management Important Message (Signed)
 Important Message  Patient Details IM Letter given. Name: Charles Lloyd. MRN: [id number] Date of Birth: 11-05-1940   Important Message Given:  Yes - Medicare IM     Charles Lloyd 03/13/2024, 3:59 PM

## 2024-03-13 NOTE — Progress Notes (Signed)
 " PROGRESS NOTE    Charles Lloyd.  FMW:[id number] DOB: Dec 05, 1940 DOA: 02/22/2024 PCP: Theophilus Andrews, Tully GRADE, MD    Chief Complaint  Patient presents with   Fall    Brief Narrative:  84yo with h/o HTN, HLD, T2DM, afib on Eliquis , and L4-5 spondylolisthesis s/p fusion on 12/11 who presented with LLE weakness and pain with falls. He was found to have a 3.5 x 2.4 cm L psoas muscle abscess. Neurosurgery recommended IR drainage and IV antibiotics. Underwent CT-guided aspiration on 02/24/24. Developed hospital-associated delirium requiring ICU transfer, now improved. Currently on daptomycin  and cefepime  till 04/13/2024 as per ID recommendations. Psychiatry consulted on 03/06/2024 for intermittent agitation and as per family request. Palliative care also consulted for goals of care discussion.    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 - Patient seen in consultation by IR and patient underwent CT-guided aspiration of left psoas fluid collection with drain placement. - No growth noted on fluid and blood cultures. - Patient was seen in consultation by ID and on daptomycin  and cefepime  with end date scheduled for 04/13/2024 however due to AKI patient was reassessed by ID and due to concerns for cefepime  being more likely to cause AIN, and myoclonus and as such cefepime  was  discontinued and ID recommended starting patient on ciprofloxacin . -Ciprofloxacin  being changed to ertapenem  1 g IV daily per ID. -EOT 04/10/2024 - ID following and appreciate input and recommendations.  2.  Acute metabolic encephalopathy/delirium/alcohol  withdrawal syndrome with delirium/possible underlying dementia -Patient initially required Precedex  drip which has been tapered off. - Status post completion of phenobarbital  taper. - MRI brain done on 02/24/2024 negative for any acute abnormalities. - Repeat CT head done 03/10/2024 negative for any acute intracranial abnormalities. - Patient with no respiratory symptoms. - Blood cultures obtained negative. - Ammonia level within normal limits at 19. - Vitamin B12 noted at 718. - TSH within normal limits at 2.510. - Folate level noted > 20. - EEG done suggestive of generalized cerebral dysfunction likely related to toxic metabolic etiology.  No seizures or epileptiform discharges were seen throughout the recording. -It is noted that son concerned about paradoxical response to Ativan  and requesting Ativan  be avoided which has been added as an allergy. -Patient also seen in consultation by psychiatry 03/06/2024 for intermittent agitation as per family request.  Patient was started on Rozerem  per psychiatry and patient subsequently became very sleepy with intermittent tremors and poor low oral intake that was abnormal was discontinued by psychiatry. - Patient improving clinically daily, more alert today and following some commands.   - Patient with some intermittent bouts of confusion.  -It is noted per Dr.Alekh that son wants to avoid MRI as much as possible as patient might need more sedation to get it done. -Gabapentin  resumed but renally dosed today.  -As patient has improved clinically will not repeat MRI  at this time.  - Continue treatment as in problem #1. - Palliative care following.  3.  Acute kidney injury -Patient noted to have  worsening renal function and fluid likely secondary to a prerenal azotemia in the setting of low BPs and concern for AIN in the setting of cefepime . -Renal ultrasound negative for hydronephrosis. - Patient seen in consultation by urology and cefepime  noted to have been discontinued 03/10/2024. -Renal function slowly trending down currently at 1.79 today. -ANA, ANCA negative.  SPEP pending. - Nephrology recommending continuation of supportive care with IV fluids of LR at 100 cc/h for the next 24 hours until patient with adequate oral intake. - Nephrology following.  4.  Hyponatremia -Resolved.  5.  Metabolic acidosis -Likely secondary to AKI. - Continue bicarb tablets. - Follow.  6.  Paroxysmal atrial fibrillation - Currently rate controlled on Coreg , flecainide .   - Eliquis  for anticoagulation.    7.  Chronic diastolic heart failure/hypertension/hyperlipidemia -Currently compensated. - Continue hydralazine , Coreg . - Torsemide  resumed on 03/04/2024 but held on 03/07/2024 due to increasing creatinine/AKI. -Will hold torsemide  until outpatient follow-up with cardiology - Outpatient follow-up with cardiology. - Statin on hold due to transaminitis.  8.  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 - CBG noted at 109 this morning. -Continue gabapentin . - SSI.  9. asthma -Stable. - Montelukast .  10.  Recurrent falls -PT/OT. - Likely needs SNF.  11.  Acute on chronic urinary retention -Patient noted to have failed voiding trial on 03/04/2024 and Foley catheter had to be placed on 03/04/2024. - Continue Flomax . -Would likely be discharged with Foley catheter in place. - Outpatient follow-up with urology.  12.  Hypomagnesemia -Repleted.  13.  Hypokalemia - Replete.  14.  Transaminitis -??  Etiology c - LFTs trending up over the past 72 hours . - Acute hepatitis negative for hep A IgM, hep B surface antigen, hep B core antibody IgM.   Hep C antibody pending.   - Right upper quadrant ultrasound done with no biliary dilatation, status postcholecystectomy, slightly echogenic liver parenchyma suggesting hepatic steatosis, small right pleural effusion.   - Patient was on a statin which has been discontinued.   - Patient on daptomycin .   - Ciprofloxacin  being changed to Invanz  per ID.   - GI consulted and feels likely transaminitis secondary to DILI.  - CK level ordered per GI to exclude daptomycin  as that can cause muscle injury.  - GI recommended to continue to trend enzymes, avoid all hepatotoxins.  - Appreciate GI input and recommendations.  15.  Anemia of chronic disease - Hemoglobin stable at 8.5. - Follow H&H.   16.  Thrombocytopenia -Resolved.   17. Pressure injury, Wound 02/24/24 1245 Pressure Injury Buttocks Mid;Lower Deep Tissue Pressure Injury - Purple or maroon localized area of discolored intact skin or blood-filled blister due to damage of underlying soft tissue from pressure and/or shear. (Active)     Wound 02/24/24 1245 Pressure Injury Scrotum Circumferential Deep Tissue Pressure Injury - Purple or maroon localized area of discolored intact skin or blood-filled blister due to damage of underlying soft tissue from pressure and/or shear. (Active)       DVT prophylaxis: Eliquis  Code Status: DNR Family Communication: No family at bedside. Disposition: TBD  Status is: Inpatient Remains inpatient appropriate because: Severity of illness   Consultants:  Neurosurgery: Dr. Colon 02/23/2024 Interventional radiology: Dr. Jennefer 02/23/2024 PCCM Dr. Kara 02/24/2024 ID: Dr. Overton 02/28/2024 Psychiatry: Dr. Larina 03/06/2024 Palliative care  Stephane Palin, NP 03/07/2024 Nephrology: Dr. Dennise 03/10/2024 Gastroenterology: Dr. Albertus 03/13/2024  Procedures:  CT pelvis 02/22/2024 CT L-spine 02/22/2024 CT head CT C-spine 02/22/2024 CT head 03/10/2024 Chest x-ray 02/22/2024 Plain films of the pelvis 02/22/2024 MRI brain  02/24/2024 MRI L-spine 02/22/2024 Renal ultrasound 02/28/2024 CT-guided aspiration of left psoas fluid collection per IR: Dr. Philip 02/24/2024 EEG 03/11/2024 Right upper quadrant ultrasound 03/12/2024  Antimicrobials:  Anti-infectives (From admission, onward)    Start     Dose/Rate Route Frequency Ordered Stop   03/13/24 1400  DAPTOmycin  (CUBICIN ) 600 mg in sodium chloride  0.9 % IVPB        8 mg/kg  79.3 kg 124 mL/hr over 30 Minutes Intravenous Daily 03/13/24 1034     03/13/24 1115  ertapenem  (INVANZ ) 1 g in sodium chloride  0.9 % 100 mL IVPB        1 g 200 mL/hr over 30 Minutes Intravenous Every 24 hours 03/13/24 1034     03/11/24 1000  ciprofloxacin  (CIPRO ) IVPB 400 mg  Status:  Discontinued        400 mg 200 mL/hr over 60 Minutes Intravenous Every 24 hours 03/10/24 1456 03/13/24 1034   03/10/24 1400  DAPTOmycin  (CUBICIN ) 600 mg in sodium chloride  0.9 % IVPB  Status:  Discontinued        8 mg/kg  79.3 kg 124 mL/hr over 30 Minutes Intravenous Every 48 hours 03/09/24 1013 03/13/24 1034   03/10/24 1000  ceFEPIme  (MAXIPIME ) 2 g in sodium chloride  0.9 % 100 mL IVPB  Status:  Discontinued        2 g 200 mL/hr over 30 Minutes Intravenous Every 24 hours 03/09/24 1013 03/10/24 1456   03/07/24 1800  ceFEPIme  (MAXIPIME ) 2 g in sodium chloride  0.9 % 100 mL IVPB  Status:  Discontinued        2 g 200 mL/hr over 30 Minutes Intravenous Every 12 hours 03/07/24 1118 03/09/24 1013   02/28/24 2200  ceFEPIme  (MAXIPIME ) 2 g in sodium chloride  0.9 % 100 mL IVPB  Status:  Discontinued        2 g 200 mL/hr over 30 Minutes Intravenous Every 8 hours 02/28/24 1440 03/07/24 1118   02/28/24 1530  DAPTOmycin  (CUBICIN ) 600 mg in sodium chloride  0.9 % IVPB  Status:  Discontinued        8 mg/kg  79.3 kg 124 mL/hr over 30 Minutes Intravenous Daily 02/28/24 1440 03/09/24 1013   02/28/24 0000  daptomycin  (CUBICIN ) IVPB  Status:  Discontinued        600 mg Intravenous Every 24 hours 02/28/24 1444 03/09/24    02/28/24  0000  ceFEPime  (MAXIPIME ) IVPB  Status:  Discontinued        2 g Intravenous Every 8 hours 02/28/24 1444 03/09/24    02/23/24 2200  vancomycin  (VANCOREADY) IVPB 1500 mg/300 mL  Status:  Discontinued        1,500 mg 150 mL/hr over 120 Minutes Intravenous Every 24 hours 02/22/24 2348 02/23/24 0242   02/23/24 1000  linezolid  (ZYVOX ) IVPB 600 mg  Status:  Discontinued       Note to Pharmacy: Pt developed rash shortly after vancomycin  administration.   600 mg 300 mL/hr over 60 Minutes Intravenous Every 12 hours 02/23/24 0242 02/28/24 1440   02/23/24 0800  piperacillin -tazobactam (ZOSYN ) IVPB 3.375 g  Status:  Discontinued        3.375 g 12.5 mL/hr over 240 Minutes Intravenous Every 8 hours 02/22/24 2348 02/28/24 1440   02/22/24 2315  piperacillin -tazobactam (  ZOSYN ) IVPB 3.375 g        3.375 g 100 mL/hr over 30 Minutes Intravenous  Once 02/22/24 2306 02/23/24 0013   02/22/24 2315  vancomycin  (VANCOREADY) IVPB 1500 mg/300 mL        1,500 mg 150 mL/hr over 120 Minutes Intravenous  Once 02/22/24 2306 02/23/24 0141         Subjective: Patient alert and oriented to self.  Knows it is 2026.  Thinks it is February.  Knows who the president is states its Trump.  Patient denies any chest pain or shortness of breath.  Patient with bouts of intermittent confusion.   Objective: Vitals:   03/12/24 1320 03/12/24 2054 03/13/24 0552 03/13/24 1156  BP: (!) 147/76 (!) 157/85 (!) 147/75 138/73  Pulse: 78 91 89 84  Resp: 16 16 16 14   Temp: 97.7 F (36.5 C) 97.6 F (36.4 C) (!) 97.4 F (36.3 C) 97.7 F (36.5 C)  TempSrc: Oral Oral Oral Oral  SpO2: 98% 99% 95% 99%  Weight:      Height:        Intake/Output Summary (Last 24 hours) at 03/13/2024 1327 Last data filed at 03/13/2024 9360 Gross per 24 hour  Intake 1595.87 ml  Output 1150 ml  Net 445.87 ml   Filed Weights   02/22/24 1238 02/24/24 1245  Weight: 79.4 kg 79.3 kg    Examination:  General exam: NAD. Respiratory system: Lungs clear  to auscultation bilaterally.  No wheezes, no crackles, no rhonchi.  Fair air movement.  Speaking in full sentences.   Cardiovascular system: RRR no murmurs rubs or gallops.  No JVD.  No lower extremity edema.   Gastrointestinal system: Abdomen is soft, nontender, nondistended, positive bowel sounds.  No rebound.  No guarding.  Central nervous system: Alert.  Moving extremities spontaneously.  No focal neurological deficits. Extremities: Symmetric 5 x 5 power. Skin: No rashes, lesions or ulcers Psychiatry: Judgement and insight poor to fair. Mood & affect appropriate.     Data Reviewed: I have personally reviewed following labs and imaging studies  CBC: Recent Labs  Lab 03/07/24 0350 03/10/24 0352 03/11/24 0357 03/12/24 0412 03/13/24 0312  WBC 6.1 9.7 7.7 6.8 7.2  NEUTROABS 3.4 7.3 5.5 4.7  --   HGB 9.4* 8.3* 7.7* 8.0* 8.5*  HCT 27.6* 25.5* 23.5* 25.1* 26.9*  MCV 86.8 90.7 90.4 91.3 91.8  PLT 129* 171 178 187 195    Basic Metabolic Panel: Recent Labs  Lab 03/08/24 0304 03/09/24 0304 03/10/24 0352 03/11/24 0357 03/12/24 0412 03/13/24 0312  NA 133* 137 141 144 142 141  K 3.6 3.6 3.4* 3.1* 3.2* 3.1*  CL 97* 102 108 111 112* 110  CO2 23 20* 19* 19* 18* 21*  GLUCOSE 109* 125* 113* 89 91 120*  BUN 19 32* 42* 41* 37* 32*  CREATININE 1.58* 2.33* 2.60* 2.39* 2.09* 1.79*  CALCIUM  9.6 8.7* 8.5* 8.8* 9.1 9.0  MG 1.9 1.8 1.8 1.9 1.8  --     GFR: Estimated Creatinine Clearance: 31.3 mL/min (A) (by C-G formula based on SCr of 1.79 mg/dL (H)).  Liver Function Tests: Recent Labs  Lab 03/09/24 0304 03/10/24 0352 03/11/24 0357 03/12/24 0412 03/13/24 0312  AST 36 104* 169* 292* 369*  ALT 13 26 47* 87* 129*  ALKPHOS 103 109 113 88 98  BILITOT 0.8 0.7 0.7 0.6 0.6  PROT 5.7* 5.4* 5.2* 5.2* 5.3*  ALBUMIN  2.8* 2.7* 2.6* 2.6* 2.5*    CBG: Recent Labs  Lab 03/12/24  1153 03/12/24 1649 03/12/24 2052 03/13/24 0802 03/13/24 1152  GLUCAP 103* 123* 120* 109* 156*      Recent Results (from the past 240 hours)  MRSA Next Gen by PCR, Nasal     Status: None   Collection Time: 03/11/24  5:49 PM   Specimen: Nasal Mucosa; Nasal Swab  Result Value Ref Range Status   MRSA by PCR Next Gen NOT DETECTED NOT DETECTED Final    Comment: (NOTE) The GeneXpert MRSA Assay (FDA approved for NASAL specimens only), is one component of a comprehensive MRSA colonization surveillance program. It is not intended to diagnose MRSA infection nor to guide or monitor treatment for MRSA infections. Test performance is not FDA approved in patients less than 74 years old. Performed at [facility], [address], [place], [place] [address]          Radiology Studies: US  Abdomen Limited RUQ (LIVER/GB) Result Date: 03/12/2024 CLINICAL DATA:  Transaminitis EXAM: ULTRASOUND ABDOMEN LIMITED RIGHT UPPER QUADRANT COMPARISON:  CT 11/16/2021 FINDINGS: Gallbladder: Surgically absent Common bile duct: Diameter: 5.4 mm Liver: Slightly echogenic liver parenchyma. Small cyst in the right hepatic lobe measuring 13 mm. Portal vein is patent on color Doppler imaging with normal direction of blood flow towards the liver. Other: Small right pleural effusion incidentally noted IMPRESSION: 1. Status post cholecystectomy. No biliary dilatation. 2. Slightly echogenic liver parenchyma suggesting hepatic steatosis and or hepatocellular disease. 3. Small right pleural effusion. Electronically Signed   By: Luke Bun M.D.   On: 03/12/2024 15:37   EEG adult Result Date: 03/11/2024 Shelton Arlin KIDD, MD     03/11/2024  3:52 PM Patient Name: Charles Lloyd. MRN: [id number] Epilepsy Attending: Arlin KIDD Shelton Referring Physician/Provider: Sebastian Toribio GAILS, MD Date: 03/11/2024 Duration: 24.26 mins Patient history: 84yo M with jerking. EEG to evaluate for seizure Level of alertness: Awake AEDs during EEG study: None Technical aspects: This EEG study was done with scalp electrodes  positioned according to the 10-20 International system of electrode placement. Electrical activity was reviewed with band pass filter of 1-70Hz , sensitivity of 7 uV/mm, display speed of 13mm/sec with a 60Hz  notched filter applied as appropriate. EEG data were recorded continuously and digitally stored.  Video monitoring was available and reviewed as appropriate. Description: EEG showed continuous generalized 3 to 6 Hz theta-delta slowing, triphasic morphology. Hyperventilation and photic stimulation were not performed.   ABNORMALITY - Continuous slow, generalized IMPRESSION: This study is suggestive of generalized cerebral dysfunction (encephalopathy) likely related to toxic-metabolic etiology. No seizures or epileptiform discharges were seen throughout the recording. Priyanka O Yadav        Scheduled Meds:  allopurinol   100 mg Oral Daily   apixaban   2.5 mg Oral BID   carvedilol   3.125 mg Oral BID   Chlorhexidine  Gluconate Cloth  6 each Topical Daily   cycloSPORINE   1 drop Both Eyes Daily   flecainide   50 mg Oral BID   folic acid   1 mg Intravenous Daily   gabapentin   300 mg Oral TID   hydrALAZINE   25 mg Oral BID   hydrocortisone  cream   Topical TID   insulin  aspart  0-6 Units Subcutaneous TID AC & HS   montelukast   10 mg Oral QHS   sodium bicarbonate   650 mg Oral BID   sodium chloride  flush  3 mL Intravenous Q12H   tamsulosin   0.4 mg Oral QPC supper   thiamine   100 mg Oral Daily   Continuous Infusions:  DAPTOmycin   ertapenem  1 g (03/13/24 1157)   lactated ringers  100 mL/hr at 03/13/24 1013     LOS: 20 days    Time spent: 40 minutes    Toribio Hummer, MD [facility]   To contact the attending provider between 7A-7P or the covering provider during after hours 7P-7A, please log into the web site [email or web address] and access using universal [place] password for that web site. If you do not have the password, please call the hospital operator.  03/13/2024, 1:27 PM     "

## 2024-03-13 NOTE — Progress Notes (Signed)
 Pharmacy Antibiotic Note  Charles Lloyd. is a 84 y.o. male admitted on 02/22/2024 with psoas abscess and concern for involvement of lumbar hardware.  Pharmacy has been consulted for Daptomycin  dosing and to transition Cipro  back to Ertapenem  for gram negative coverage.   The patient have AKI now resolving, per renal suspect hemodynamic insults, AIN seems less likely but was transitioned off of Cefepime  to Ciprofloxacin  while undergoing work up. Plan is now to transition to Ertapenem  to finish out OPAT course - will adjust regimen, same EOT 04/10/24  Plan: - Adjust Daptomcyin back to 600 mg IV every 24 hours - D/c Cipro  and start Ertapenem  1g IV every 24 hours - Will update OPAT orders, planned EOT 04/10/24  Height: 5' 8 (172.7 cm) Weight: 79.3 kg (174 lb 13.2 oz) IBW/kg (Calculated) : 68.4  Temp (24hrs), Avg:97.6 F (36.4 C), Min:97.4 F (36.3 C), Max:97.7 F (36.5 C)  Recent Labs  Lab 03/07/24 0350 03/08/24 0304 03/09/24 0304 03/10/24 0352 03/11/24 0357 03/12/24 0412 03/13/24 0312  WBC 6.1  --   --  9.7 7.7 6.8 7.2  CREATININE 1.31*   < > 2.33* 2.60* 2.39* 2.09* 1.79*   < > = values in this interval not displayed.    Estimated Creatinine Clearance: 31.3 mL/min (A) (by C-G formula based on SCr of 1.79 mg/dL (H)).    Allergies[1]  Antimicrobials this admission: Vancomycin  12/27 x 1 Zosyn  12/27 >> 1/2 Linezolid  12/28 >> 1/2 Cefepime  1/2 >> 1/13 Daptomycin  1/2 >> Cipro  1/14 >> 1/16 Ertapenem  1/16 >>  Dose adjustments this admission:   Microbiology results: 12/27 BCx >> NGf 12/29 fluid >> NGf  Thank you for allowing pharmacy to be a part of this patients care.  Almarie Lunger, PharmD, BCPS, BCIDP Infectious Diseases Clinical Pharmacist 03/13/2024 11:14 AM   **Pharmacist phone directory can now be found on [email or web address] ([email or web address]).  Listed under [facility].      [1]  Allergies Allergen Reactions   Amlodipine  Nausea Only, Swelling and Other (See Comments)     Severe edema and chest pain- also   Ativan  [Lorazepam ] Other (See Comments)    Made the patient fidgety and he hallucinates   Chlorhexidine  Gluconate Itching and Other (See Comments)    Only itching with wipes, not with soap

## 2024-03-13 NOTE — Consult Note (Signed)
 "  Referring Provider: Dr. Sebastian, [facility] [place] Primary Care Physician:  Theophilus Andrews, Tully GRADE, MD Primary Gastroenterologist:  Dr. Lilian, [facility]  Reason for Consultation:  Transaminitis  HPI: Charles Flett. is a 84 y.o. 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 He was found to have a 3.5 x 2.4 cm L psoas muscle abscess. Neurosurgery recommended IR drainage and IV antibiotics. Underwent CT-guided aspiration on 02/24/24. Developed hospital-associated delirium requiring ICU transfer, now improved. Has been on several antibiotics and ID is following.  Currently on daptomycin  and cipro , but ID is changing the cipro  to Invanz  today. Psychiatry consulted on 03/06/2024 for intermittent agitation and as per family request. Palliative care also consulted for goals of care discussion.  GI was consulted today for transaminitis.  LFTs have been trending up for the past 4 days, were normal prior to that.  AST is 369, ALT 129 with a normal alk phos and total bili.  Looking back in May 2024 he had some very minimal elevation.  Ultrasound as below does show some hepatic steatosis.  Acute viral hepatitis panel negative so far, only thing pending is the HCV.  Son is at bedside.  Discussed with him outside the room as patient was resting.  Patient has not had any complaints of abdominal pain or other GI symptoms  Ultrasound: IMPRESSION: 1. Status post cholecystectomy. No biliary dilatation. 2. Slightly echogenic liver parenchyma suggesting hepatic steatosis and or hepatocellular disease. 3. Small right pleural effusion.   Past Medical History:  Diagnosis Date   Acute tracheobronchitis 01/05/2019   B12 deficiency    Basal cell carcinoma (BCC) of left side of nose 01/28/2018   Cancer (HCC)    Cardiomyopathy (HCC) 03/10/2018   With chronic atrial fibrillation    CHF (congestive heart failure) (HCC) 04/16/2013   Functional class II, ejection fraction 35-40%  Formatting of this note might be different from the original. Functional class II, ejection fraction 35-40%   Chronic anticoagulation    Chronic diastolic (congestive) heart failure (HCC) 04/16/2013   Functional class II, ejection fraction 35-40%  Last Assessment & Plan:  Clinically stable Volume well controlled meds reviewed   Chronic diastolic CHF (congestive heart failure) (HCC) 04/16/2013   Functional class II, ejection fraction 35-40%  Last Assessment & Plan:  Clinically stable Volume well controlled meds reviewed   Colon polyps 04/16/2013   Coronary artery disease involving native coronary artery of native heart without angina pectoris 04/05/2022   Myoview  05/25/2019: EF 60, normal perfusion, low risk CCTA (AF protocol) 04/25/20: CAC score 4242 (99th percentile)   Diabetic neuropathy (HCC)    Difficult intubation    pt reports difficult intubation during surgery in [place] in 2015   Dysrhythmia    A.Fib   Eosinophil count raised 02/17/2019   Essential hypertension 01/06/2010   Last Assessment & Plan:  Well controlled Continue med management   Gout    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  Steroid usage was discontinued several months ago.  Follow up with Dermatology as planned.   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   Last Assessment & Plan:  Id following ?ongoing abx managementy reported Request records   Infection of prosthetic right knee joint    Insomnia 03/04/2012   Grief with loss of wife 02/20/12   Last Assessment & Plan:  Improved sx  contineu xanax prn Se discussed   Lesion of liver 04/20/2019   -On cardiac CT 02/2019   Mild reactive airways disease    Neoplasm of prostate 04/16/2013   Neoplasm of prostate, 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 Transthoracic Echocardiogram-03/10/2013-[facility]: Normal left ventricular wall thickness and cavity size.  Global left ventricular systolic function is moderately reduced.  The estimated ejection fraction is 35-40%.  The left atrium is moderately enlarged.  The right atrium is mildly enlarged.  No significant valvular    Overweight (BMI 25.0-29.9) 10/22/2016   Persistent atrial fibrillation (HCC) 04/16/2013   Last Assessment & Plan:  Rate controlled Continue med management eliquis  for stroke preventino  Formatting of this note might be different from the original.  Drug  HX Current Rx Pre-ABL inefficacy Pre-ABL intolerant Post-ABL inefficacy Post-ABL intolerant max dose/24h M/Y end comments  sotalol                  dofetilide                  flecainide                   propafenone                  am   S/P TKR (total knee replacement), bilateral    Secondary hypercoagulable state 04/09/2019   Tinea cruris 04/16/2013   Type 2 diabetes mellitus with diabetic polyneuropathy, without long-term current use of insulin  (HCC) 04/16/2013   Last Assessment & Plan:  Labs today Pt reports well controlled on ambulatory monitoring   Vitamin D  deficiency 07/25/2018    Past Surgical History:  Procedure Laterality Date   ANTERIOR LAT LUMBAR FUSION N/A 02/06/2024   Procedure: ANTERIOR PRONE LATERAL LUMBAR FUSION LUMBAR FOUR-LUMBAR FIVE;  Surgeon:  Darnella Dorn SAUNDERS, MD;  Location: [facility] OR;  Service: Neurosurgery;  Laterality: N/A;   ATRIAL FIBRILLATION ABLATION N/A 03/12/2019   Procedure: ATRIAL FIBRILLATION ABLATION;  Surgeon: Inocencio Soyla Lunger, MD;  Location: [facility] INVASIVE CV LAB;  Service: Cardiovascular;  Laterality: N/A;   ATRIAL FIBRILLATION ABLATION N/A 04/29/2020   Procedure: ATRIAL FIBRILLATION ABLATION;  Surgeon: Inocencio Soyla Lunger, MD;  Location: [facility] INVASIVE CV LAB;  Service: Cardiovascular;  Laterality: N/A;   CARDIAC ELECTROPHYSIOLOGY STUDY AND ABLATION     CARDIOVERSION     CARDIOVERSION N/A 10/10/2018   Procedure: CARDIOVERSION;  Surgeon: Delford Maude BROCKS, MD;  Location: [facility] ENDOSCOPY;  Service: Cardiovascular;  Laterality: N/A;   CARDIOVERSION N/A 05/18/2020   Procedure: CARDIOVERSION;  Surgeon: Alveta Aleene PARAS, MD;  Location: [facility] ENDOSCOPY;  Service: Cardiovascular;  Laterality: N/A;   CARDIOVERSION N/A 12/09/2020   Procedure: CARDIOVERSION;  Surgeon: Pietro Redell RAMAN, MD;  Location: [facility] ENDOSCOPY;  Service: Cardiovascular;  Laterality: N/A;   CHOLECYSTECTOMY     PROSTATECTOMY     REPLACEMENT TOTAL KNEE BILATERAL  Prior to Admission medications  Medication Sig Start Date End Date Taking? Authorizing Provider  allopurinol  (ZYLOPRIM ) 300 MG tablet TAKE 1 TABLET BY MOUTH EVERY DAY 01/13/24  Yes Theophilus Andrews, Tully GRADE, MD  apixaban  (ELIQUIS ) 5 MG TABS tablet Take 1 tablet (5 mg total) by mouth 2 (two) times daily. 08/02/23  Yes Theophilus Andrews, Tully GRADE, MD  carvedilol  (COREG ) 3.125 MG tablet TAKE 1 TABLET BY MOUTH TWICE A DAY Patient taking differently: Take 3.125 mg by mouth in the morning and at bedtime. 12/31/23  Yes Theophilus Andrews, Tully GRADE, MD  cyclobenzaprine  (FLEXERIL ) 10 MG tablet Take 1 tablet (10 mg total) by mouth 3 (three) times daily as needed for muscle spasms. 02/07/24  Yes Darnella Dorn SAUNDERS, MD  docusate sodium  (COLACE) 100 MG capsule Take 1 capsule (100 mg total) by mouth daily as needed (please take  daily while on narcotic pain medication). 02/07/24 02/06/25 Yes Darnella Dorn SAUNDERS, MD  flecainide  (TAMBOCOR ) 100 MG tablet TAKE 1 TABLET BY MOUTH TWICE A DAY Patient taking differently: Take 100 mg by mouth in the morning and at bedtime. 10/24/23  Yes Camnitz, Will Gladis, MD  fluticasone  (FLONASE ) 50 MCG/ACT nasal spray Place 2 sprays into both nostrils daily. Patient taking differently: Place 2 sprays into both nostrils daily as needed (for seasonal allergies). 01/07/19  Yes Rosario Leatrice FERNS, MD  montelukast  (SINGULAIR ) 10 MG tablet Take 10 mg by mouth at bedtime.   Yes [provider]  [Paused] potassium chloride  SA (KLOR-CON  M20) 20 MEQ tablet Take 1 tablet (20 mEq total) by mouth daily. Wait to take this until your doctor or other care provider tells you to start again. 09/18/23  Yes Theophilus Andrews, Tully GRADE, MD  RESTASIS  0.05 % ophthalmic emulsion Place 1 drop into both eyes in the morning and at bedtime. 12/09/19  Yes [provider]  acetaminophen  (TYLENOL ) 325 MG tablet Take 2 tablets (650 mg total) by mouth every 6 (six) hours as needed for mild pain (pain score 1-3) or fever (or Fever >/= 101). 03/01/24   Barbarann Nest, MD  atorvastatin  (LIPITOR) 20 MG tablet TAKE 1 TABLET DAILY (PLEASE SCHEDULE A PHYSICAL FOR MORE REFILLS) 03/02/24   Theophilus Andrews, Tully GRADE, MD  folic acid  (FOLVITE ) 1 MG tablet Take 1 tablet (1 mg total) by mouth daily. 03/02/24   Barbarann Nest, MD  gabapentin  (NEURONTIN ) 300 MG capsule Take 1-2 capsules (300-600 mg total) by mouth See admin instructions. Take 300 mg by mouth in the morning & at noon and 600 mg at bedtime 03/01/24   Barbarann Nest, MD  hydrALAZINE  (APRESOLINE ) 25 MG tablet TAKE 1 TABLET (25 MG TOTAL) BY MOUTH 4 (FOUR) TIMES DAILY. Patient taking differently: Take 25 mg by mouth in the morning and at bedtime. 10/22/23   Bernie Lamar PARAS, MD  hydrocortisone  cream 1 % Apply topically 3 (three) times daily. 03/01/24   Barbarann Nest, MD   oxyCODONE  (OXY IR/ROXICODONE ) 5 MG immediate release tablet Take 1 tablet (5 mg total) by mouth every 6 (six) hours as needed for moderate pain (pain score 4-6). Patient not taking: Reported on 02/24/2024 02/07/24   Darnella Dorn SAUNDERS, MD  sodium chloride  flush (NS) 0.9 % SOLN 10-40 mLs by Intracatheter route as needed (flush). 03/01/24   Barbarann Nest, MD  sodium chloride  flush (NS) 0.9 % SOLN Inject 3 mLs into the vein every 12 (twelve) hours. 03/01/24   Barbarann Nest, MD  tamsulosin  (FLOMAX ) 0.4 MG CAPS capsule Take 1 capsule (0.4 mg  total) by mouth daily after supper. 03/01/24   Barbarann Nest, MD  [Paused] torsemide  (DEMADEX ) 20 MG tablet Take 1 tablet (20 mg total) by mouth 2 (two) times daily. Wait to take this until your doctor or other care provider tells you to start again. 05/03/23   Krasowski, Robert J, MD    Current Facility-Administered Medications  Medication Dose Route Frequency Provider Last Rate Last Admin   acetaminophen  (TYLENOL ) tablet 650 mg  650 mg Oral Q6H PRN Opyd, Timothy S, MD   650 mg at 03/06/24 1503   Or   acetaminophen  (TYLENOL ) suppository 650 mg  650 mg Rectal Q6H PRN Opyd, Timothy S, MD       allopurinol  (ZYLOPRIM ) tablet 100 mg  100 mg Oral Daily Alekh, Kshitiz, MD   100 mg at 03/12/24 1507   apixaban  (ELIQUIS ) tablet 2.5 mg  2.5 mg Oral BID Cheryle Page, MD   2.5 mg at 03/12/24 2125   artificial tears ophthalmic solution 1 drop  1 drop Both Eyes PRN Sebastian Toribio GAILS, MD   1 drop at 03/12/24 9787   bisacodyl  (DULCOLAX) EC tablet 5 mg  5 mg Oral Daily PRN Opyd, Timothy S, MD       carvedilol  (COREG ) tablet 3.125 mg  3.125 mg Oral BID Crozier, Peyton, PA-C   3.125 mg at 03/12/24 2125   Chlorhexidine  Gluconate Cloth 2 % PADS 6 each  6 each Topical Daily Barbarann Nest, MD   6 each at 03/12/24 1055   ciprofloxacin  (CIPRO ) IVPB 400 mg  400 mg Intravenous Q24H Dennise Kingsley, MD 200 mL/hr at 03/12/24 0910 400 mg at 03/12/24 0910   cycloSPORINE  (RESTASIS ) 0.05 %  ophthalmic emulsion 1 drop  1 drop Both Eyes Daily Opyd, Timothy S, MD   1 drop at 03/12/24 0856   DAPTOmycin  (CUBICIN ) 600 mg in sodium chloride  0.9 % IVPB  8 mg/kg Intravenous Q48H Gladis Almarie PARAS, [facility] 124 mL/hr at 03/12/24 1456 600 mg at 03/12/24 1456   flecainide  (TAMBOCOR ) tablet 50 mg  50 mg Oral BID Cheryle Page, MD   50 mg at 03/12/24 2125   folic acid  injection 1 mg  1 mg Intravenous Daily Sebastian Toribio GAILS, MD   1 mg at 03/12/24 1507   gabapentin  (NEURONTIN ) capsule 300 mg  300 mg Oral TID Sebastian Toribio GAILS, MD   300 mg at 03/12/24 2124   hydrALAZINE  (APRESOLINE ) tablet 25 mg  25 mg Oral BID Crozier, Peyton, PA-C   25 mg at 03/12/24 2126   hydrocortisone  cream 1 %   Topical TID Jillian Buttery, MD   Given at 03/11/24 1741   insulin  aspart (novoLOG ) injection 0-6 Units  0-6 Units Subcutaneous TID AC & HS Barbarann Nest, MD   3 Units at 03/07/24 2313   lactated ringers  infusion   Intravenous Continuous Sebastian Toribio GAILS, MD 100 mL/hr at 03/12/24 2337 New Bag at 03/12/24 2337   lip balm (CARMEX) ointment 1 Application  1 Application Topical PRN Cheryle Page, MD       liver oil-zinc  oxide (DESITIN) 40 % ointment   Topical Daily PRN Cheryle Page, MD       montelukast  (SINGULAIR ) tablet 10 mg  10 mg Oral QHS Crozier, Peyton, PA-C   10 mg at 03/12/24 2125   Oral care mouth rinse  15 mL Mouth Rinse PRN Barbarann Nest, MD       oxybutynin  (DITROPAN ) tablet 5 mg  5 mg Oral Q8H PRN Cheryle Page, MD   5 mg  at 03/06/24 2134   potassium chloride  (KLOR-CON ) packet 40 mEq  40 mEq Oral Once Sebastian Toribio GAILS, MD       senna-docusate (Senokot-S) tablet 1 tablet  1 tablet Oral QHS PRN Opyd, Timothy S, MD       sodium bicarbonate  tablet 650 mg  650 mg Oral BID Sebastian Toribio GAILS, MD   650 mg at 03/12/24 2124   sodium chloride  flush (NS) 0.9 % injection 10-40 mL  10-40 mL Intracatheter PRN Opyd, Evalene RAMAN, MD       sodium chloride  flush (NS) 0.9 % injection 10-40 mL  10-40 mL Intracatheter  PRN Barbarann Nest, MD       sodium chloride  flush (NS) 0.9 % injection 3 mL  3 mL Intravenous Q12H Opyd, Timothy S, MD   3 mL at 03/11/24 1033   tamsulosin  (FLOMAX ) capsule 0.4 mg  0.4 mg Oral QPC supper Barbarann Nest, MD   0.4 mg at 03/12/24 1801   thiamine  (VITAMIN B1) tablet 100 mg  100 mg Oral Daily Jillian Buttery, MD   100 mg at 03/12/24 1507    Allergies as of 02/22/2024 - Review Complete 02/22/2024  Allergen Reaction Noted   Amlodipine  Other (See Comments) 06/04/2023   Chlorhexidine  gluconate Itching 02/06/2024    Family History  Problem Relation Age of Onset   Cancer Mother    Depression Mother    Early death Mother    Cancer Father    Depression Father    Early death Father     Social History   Socioeconomic History   Marital status: Widowed    Spouse name: Not on file   Number of children: Not on file   Years of education: Not on file   Highest education level: Not on file  Occupational History   Not on file  Tobacco Use   Smoking status: Never    Passive exposure: Never   Smokeless tobacco: Never  Vaping Use   Vaping status: Never Used  Substance and Sexual Activity   Alcohol  use: Not Currently    Alcohol /week: 14.0 standard drinks of alcohol     Types: 14 Shots of liquor per week    Comment: sips on rum and has brandy before bed nightly   Drug use: Never   Sexual activity: Not Currently  Other Topics Concern   Not on file  Social History Narrative   Not on file   Social Drivers of Health   Tobacco Use: Low Risk (02/22/2024)   Patient History    Smoking Tobacco Use: Never    Smokeless Tobacco Use: Never    Passive Exposure: Never  Financial Resource Strain: Low Risk (12/21/2022)   Overall Financial Resource Strain (CARDIA)    Difficulty of Paying Living Expenses: Not hard at all  Food Insecurity: No Food Insecurity (02/23/2024)   Epic    Worried About Programme Researcher, Broadcasting/film/video in the Last Year: Never true    Ran Out of Food in the Last Year:  Never true  Transportation Needs: No Transportation Needs (02/23/2024)   Epic    Lack of Transportation (Medical): No    Lack of Transportation (Non-Medical): No  Physical Activity: Inactive (12/21/2022)   Exercise Vital Sign    Days of Exercise per Week: 0 days    Minutes of Exercise per Session: 0 min  Stress: No Stress Concern Present (12/21/2022)   [employer] of Occupational Health - Occupational Stress Questionnaire    Feeling of Stress : Not at all  Social Connections: Socially Isolated (02/23/2024)   Social Connection and Isolation Panel    Frequency of Communication with Friends and Family: More than three times a week    Frequency of Social Gatherings with Friends and Family: More than three times a week    Attends Religious Services: Never    Database Administrator or Organizations: No    Attends Banker Meetings: Never    Marital Status: Widowed  Intimate Partner Violence: Not At Risk (02/23/2024)   Epic    Fear of Current or Ex-Partner: No    Emotionally Abused: No    Physically Abused: No    Sexually Abused: No  Depression (PHQ2-9): Low Risk (02/11/2024)   Depression (PHQ2-9)    PHQ-2 Score: 0  Alcohol  Screen: Low Risk (12/21/2022)   Alcohol  Screen    Last Alcohol  Screening Score (AUDIT): 4  Housing: Low Risk (02/23/2024)   Epic    Unable to Pay for Housing in the Last Year: No    Number of Times Moved in the Last Year: 0    Homeless in the Last Year: No  Utilities: Not At Risk (02/23/2024)   Epic    Threatened with loss of utilities: No  Health Literacy: Adequate Health Literacy (12/21/2022)   B1300 Health Literacy    Frequency of need for help with medical instructions: Never    Review of Systems: ROS is O/W negative except as mentioned in HPI.  Physical Exam: Vital signs in last 24 hours: Temp:  [97.4 F (36.3 C)-97.7 F (36.5 C)] 97.4 F (36.3 C) (01/16 0552) Pulse Rate:  [78-91] 89 (01/16 0552) Resp:  [16] 16 (01/16  0552) BP: (147-157)/(75-85) 147/75 (01/16 0552) SpO2:  [95 %-99 %] 95 % (01/16 0552) Last BM Date : 03/11/24 General:  Alert, sleepy and chronically ill-appearing in NAD Head:  Normocephalic and atraumatic. Eyes:  Sclera clear, no icterus.  Conjunctiva pink. Ears:  Normal auditory acuity. Mouth:  No deformity or lesions.   Lungs:  Clear throughout to auscultation.  No wheezes, crackles, or rhonchi.  Heart:  Regular rate and rhythm; no murmurs, clicks, rubs, or gallops. Abdomen:  Soft, non-distended.  BS present.  Non-tender. Msk:  Symmetrical without gross deformities. Pulses:  Normal pulses noted. Extremities:  Without clubbing or edema. Neurologic:  Sleeping during by visit. Skin:  Intact without significant lesions or rashes.  Intake/Output from previous day: 01/15 0701 - 01/16 0700 In: 1835.9 [P.O.:340; I.V.:1495.9] Out: 1600 [Urine:1600]  Lab Results: Recent Labs    03/11/24 0357 03/12/24 0412 03/13/24 0312  WBC 7.7 6.8 7.2  HGB 7.7* 8.0* 8.5*  HCT 23.5* 25.1* 26.9*  PLT 178 187 195   BMET Recent Labs    03/11/24 0357 03/12/24 0412 03/13/24 0312  NA 144 142 141  K 3.1* 3.2* 3.1*  CL 111 112* 110  CO2 19* 18* 21*  GLUCOSE 89 91 120*  BUN 41* 37* 32*  CREATININE 2.39* 2.09* 1.79*  CALCIUM  8.8* 9.1 9.0   LFT Recent Labs    03/13/24 0312  PROT 5.3*  ALBUMIN  2.5*  AST 369*  ALT 129*  ALKPHOS 98  BILITOT 0.6    Studies/Results: US  Abdomen Limited RUQ (LIVER/GB) Result Date: 03/12/2024 CLINICAL DATA:  Transaminitis EXAM: ULTRASOUND ABDOMEN LIMITED RIGHT UPPER QUADRANT COMPARISON:  CT 11/16/2021 FINDINGS: Gallbladder: Surgically absent Common bile duct: Diameter: 5.4 mm Liver: Slightly echogenic liver parenchyma. Small cyst in the right hepatic lobe measuring 13 mm. Portal vein is patent on color Doppler imaging  with normal direction of blood flow towards the liver. Other: Small right pleural effusion incidentally noted IMPRESSION: 1. Status post  cholecystectomy. No biliary dilatation. 2. Slightly echogenic liver parenchyma suggesting hepatic steatosis and or hepatocellular disease. 3. Small right pleural effusion. Electronically Signed   By: Luke Bun M.D.   On: 03/12/2024 15:37   EEG adult Result Date: 03/11/2024 Shelton Arlin KIDD, MD     03/11/2024  3:52 PM Patient Name: Charles Lloyd Charles Lloyd. MRN: [id number] Epilepsy Attending: Arlin KIDD Shelton Referring Physician/Provider: Sebastian Toribio GAILS, MD Date: 03/11/2024 Duration: 24.26 mins Patient history: 84yo M with jerking. EEG to evaluate for seizure Level of alertness: Awake AEDs during EEG study: None Technical aspects: This EEG study was done with scalp electrodes positioned according to the 10-20 International system of electrode placement. Electrical activity was reviewed with band pass filter of 1-70Hz , sensitivity of 7 uV/mm, display speed of 57mm/sec with a 60Hz  notched filter applied as appropriate. EEG data were recorded continuously and digitally stored.  Video monitoring was available and reviewed as appropriate. Description: EEG showed continuous generalized 3 to 6 Hz theta-delta slowing, triphasic morphology. Hyperventilation and photic stimulation were not performed.   ABNORMALITY - Continuous slow, generalized IMPRESSION: This study is suggestive of generalized cerebral dysfunction (encephalopathy) likely related to toxic-metabolic etiology. No seizures or epileptiform discharges were seen throughout the recording. Priyanka KIDD Shelton    IMPRESSION/PLAN:  Transaminitis with AST of 369 and ALT of 129.  Alk phos and total bili normal.  It appears that these really just became elevated 3 days ago.  Looks like he had a mild elevation back in May 2024.  Does have what appears to be some fatty liver/hepatic steatosis on ultrasound.  Hepatitis panel negative so far, only thing pending is HCV.  It appears that daptomycin  can cause elevated LFTs.  Discussed with ID and she said it can, but  unlikely, but may be in addition to his statin it could have potentiated it.  Otherwise question if this could be reactive due to acute illness. - His Lipitor has been held. - Trend LFTs.  Left psoas abscess:  Has been on several antibiotics and ID is following.  Currently on daptomycin  and cipro , but ID is changing the cipro  to Invanz  today  Acute metabolic encephalopathy/delirium/alcohol  withdrawal syndrome with delirium/possible underlying dementia   PAF, on Eliquis   DM2  Normocytic anemia - Hemoglobin stable at 8.5 grams today.     Charles Lloyd. Charles Lloyd  03/13/2024, 9:08 AM      "

## 2024-03-13 NOTE — Progress Notes (Signed)
 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 Mri showed left psoas abscess. Given abx. No fever but then developed on on 12/29 and ir aspirated abscess same day --> cx ngtd   Surgical site no dehiscence or cellulitis and mri doesn't show any surgical site fluid collection   Leakage of surgical site spine into psoas is not uncommon in hematogenous spread of infection. But agree that this is rather strange when the surgical site looks great        Planning to treat as involved l4-5 site infection/hardware complicated.  OPAT orders placed for daptomycin  and cefepime  for 6 weeks EOT 04/10/2024.  ID reengaged given concern for AIN and encephalopathy Nephrology following noted cannot rule out AIN in the context of cefepime .  Patient's serum creatinine went  from 0.75 on 1/7-2.6 on 03/10/2024 #Psoas abscess status post aspiration, cultures no growth #AKI in the setting of below #Broad-spectrum antibiotics with Dapto cefepime  #Delirium/ADL withdrawals -Pt was switched from cefepime  to to ciprofloxacin  due to concern for encephalopathy and AIN.  Nephrology following .  Given cannot rule out AIN in the context of cefepime , urine sedimentation was indiscernible given  hematuria.  Of note creatinine has trended down but this is in the setting of torsemide  being stopped on 1/9, cefepime  being stopped on 1/13. - Will stop fluoroquinolone, start ertapenem  -LFTs trending up, statin stopped yesterday.  I suspect this is multifactorial. Plan: - OPAT orders pended for ertapenem  and daptomycin .  Monitor lfts and labs -Plan communicated to primary - ID will sign off  OPAT Orders Discharge antibiotics to be given via PICC line Discharge antibiotics: Dapto 600 mg daily Ertapenem  1gm q 24h     Duration: 6 weeks End Date: 04/10/24   [facility] Care Per Protocol:   Home health RN for IV administration and teaching; PICC line care and labs.     Labs weekly while on IV antibiotics: _x_ CBC with differential __ BMP _x_ CMP _x_ CRP _x_ ESR __ Vancomycin  trough _x_ CK   _x_ Please pull PIC at completion of IV antibiotics __ Please leave PIC in place until doctor has seen patient or been notified   Fax weekly labs to [phone]   Clinic Follow Up Appt: 1/20 @ 11am   @   [facility] [address], [place], [place] [address] Phone: [phone]   Microbiology:   Antibiotics: Daptomycin  1/2-present Cefepime  1/2-present pip-tazo 12/27 - 1/1 Linezolid  12/27 - 1/2 Blood  Urine  Other   SUBJECTIVE: Resting in bed.Son at bedside Interval: Afebrile overnight.  Review of Systems: Review of Systems  All other systems reviewed and are negative.    Scheduled Meds:  allopurinol   100 mg Oral Daily   apixaban   2.5 mg Oral BID   carvedilol   3.125 mg Oral BID   Chlorhexidine  Gluconate Cloth  6 each Topical Daily   cycloSPORINE   1 drop Both Eyes Daily   flecainide   50 mg Oral BID   folic acid   1 mg Intravenous Daily   gabapentin   300 mg Oral TID   hydrALAZINE   25 mg Oral BID   hydrocortisone  cream   Topical TID   insulin  aspart  0-6 Units Subcutaneous TID AC & HS   montelukast   10 mg Oral QHS   sodium bicarbonate   650 mg Oral BID    sodium chloride  flush  3 mL Intravenous Q12H   tamsulosin   0.4 mg Oral QPC supper   thiamine   100 mg Oral Daily   Continuous Infusions:  DAPTOmycin  600 mg (03/13/24 1452)   ertapenem  1 g (03/13/24 1157)   lactated ringers  100 mL/hr at 03/13/24 1819   PRN Meds:.acetaminophen  **OR** acetaminophen , artificial tears, bisacodyl , lip balm, liver oil-zinc  oxide, mouth rinse, oxybutynin , senna-docusate, sodium chloride  flush, sodium chloride  flush Allergies[1]  OBJECTIVE: Vitals:   03/12/24 1320 03/12/24 2054 03/13/24 0552 03/13/24 1156  BP: (!) 147/76 (!) 157/85 (!) 147/75 138/73  Pulse: 78 91 89 84  Resp: 16 16 16 14   Temp: 97.7 F (36.5 C) 97.6 F (36.4 C) (!) 97.4 F (36.3 C) 97.7 F (36.5 C)  TempSrc: Oral Oral Oral Oral  SpO2: 98% 99% 95% 99%  Weight:      Height:       Body mass index is 26.58 kg/m.  Physical Exam Constitutional:      General: He is not in acute distress.    Appearance: He is not toxic-appearing.  HENT:     Head: Normocephalic and atraumatic.     Right Ear: External ear normal.     Left Ear: External ear normal.     Nose: No congestion or rhinorrhea.     Mouth/Throat:     Mouth: Mucous membranes are moist.     Pharynx: Oropharynx is clear.  Eyes:     Extraocular Movements: Extraocular movements intact.     Conjunctiva/sclera: Conjunctivae normal.     Pupils: Pupils are equal, round, and reactive to light.  Cardiovascular:     Rate and Rhythm: Normal rate and regular rhythm.     Heart sounds: No murmur heard.    No friction rub. No gallop.  Pulmonary:     Effort: Pulmonary effort is normal.     Breath sounds: Normal breath sounds.  Abdominal:     General: Abdomen is flat. Bowel sounds are normal.     Palpations: Abdomen is soft.  Musculoskeletal:        General: No swelling.     Cervical back: Normal range of motion and neck supple.  Psychiatric:        Mood and Affect: Mood normal.       Lab Results Lab Results  Component Value Date    WBC 7.2 03/13/2024   HGB 8.5 (L) 03/13/2024   HCT 26.9 (L) 03/13/2024   MCV 91.8 03/13/2024   PLT 195 03/13/2024    Lab Results  Component Value Date   CREATININE 1.79 (H) 03/13/2024   BUN 32 (H) 03/13/2024   NA 141 03/13/2024   K 3.1 (L) 03/13/2024   CL 110 03/13/2024   CO2 21 (L) 03/13/2024    Lab Results  Component Value Date   ALT 129 (H)  03/13/2024   AST 369 (H) 03/13/2024   ALKPHOS 98 03/13/2024   BILITOT 0.6 03/13/2024        Loney Stank, MD [facility] 03/13/2024, 7:19 PM   Evaluation of this patient requires complex antimicrobial therapy evaluation and counseling + isolation needs for disease transmission risk assessment and mitigation       [1]  Allergies Allergen Reactions   Amlodipine  Nausea Only, Swelling and Other (See Comments)    Severe edema and chest pain- also   Ativan  [Lorazepam ] Other (See Comments)    Made the patient fidgety and he hallucinates   Chlorhexidine  Gluconate Itching and Other (See Comments)    Only itching with wipes, not with soap   "

## 2024-03-14 DIAGNOSIS — R339 Retention of urine, unspecified: Secondary | ICD-10-CM | POA: Diagnosis not present

## 2024-03-14 DIAGNOSIS — E871 Hypo-osmolality and hyponatremia: Secondary | ICD-10-CM | POA: Diagnosis not present

## 2024-03-14 DIAGNOSIS — K760 Fatty (change of) liver, not elsewhere classified: Secondary | ICD-10-CM | POA: Diagnosis not present

## 2024-03-14 DIAGNOSIS — R748 Abnormal levels of other serum enzymes: Secondary | ICD-10-CM | POA: Diagnosis not present

## 2024-03-14 DIAGNOSIS — R410 Disorientation, unspecified: Secondary | ICD-10-CM | POA: Diagnosis not present

## 2024-03-14 DIAGNOSIS — K6812 Psoas muscle abscess: Secondary | ICD-10-CM | POA: Diagnosis not present

## 2024-03-14 LAB — BASIC METABOLIC PANEL WITH GFR
Anion gap: 9 (ref 5–15)
BUN: 28 mg/dL — ABNORMAL HIGH (ref 8–23)
CO2: 22 mmol/L (ref 22–32)
Calcium: 9 mg/dL (ref 8.9–10.3)
Chloride: 111 mmol/L (ref 98–111)
Creatinine, Ser: 1.6 mg/dL — ABNORMAL HIGH (ref 0.61–1.24)
GFR, Estimated: 43 mL/min — ABNORMAL LOW
Glucose, Bld: 118 mg/dL — ABNORMAL HIGH (ref 70–99)
Potassium: 3.4 mmol/L — ABNORMAL LOW (ref 3.5–5.1)
Sodium: 142 mmol/L (ref 135–145)

## 2024-03-14 LAB — CBC
HCT: 25.6 % — ABNORMAL LOW (ref 39.0–52.0)
Hemoglobin: 8.2 g/dL — ABNORMAL LOW (ref 13.0–17.0)
MCH: 29.2 pg (ref 26.0–34.0)
MCHC: 32 g/dL (ref 30.0–36.0)
MCV: 91.1 fL (ref 80.0–100.0)
Platelets: 178 K/uL (ref 150–400)
RBC: 2.81 MIL/uL — ABNORMAL LOW (ref 4.22–5.81)
RDW: 17.9 % — ABNORMAL HIGH (ref 11.5–15.5)
WBC: 5.8 K/uL (ref 4.0–10.5)
nRBC: 0 % (ref 0.0–0.2)

## 2024-03-14 LAB — HEPATIC FUNCTION PANEL
ALT: 169 U/L — ABNORMAL HIGH (ref 0–44)
AST: 413 U/L — ABNORMAL HIGH (ref 15–41)
Albumin: 2.4 g/dL — ABNORMAL LOW (ref 3.5–5.0)
Alkaline Phosphatase: 88 U/L (ref 38–126)
Bilirubin, Direct: 0.3 mg/dL — ABNORMAL HIGH (ref 0.0–0.2)
Indirect Bilirubin: 0.2 mg/dL — ABNORMAL LOW (ref 0.3–0.9)
Total Bilirubin: 0.4 mg/dL (ref 0.0–1.2)
Total Protein: 4.9 g/dL — ABNORMAL LOW (ref 6.5–8.1)

## 2024-03-14 LAB — CK: Total CK: 3694 U/L — ABNORMAL HIGH (ref 49–397)

## 2024-03-14 LAB — GLUCOSE, CAPILLARY
Glucose-Capillary: 119 mg/dL — ABNORMAL HIGH (ref 70–99)
Glucose-Capillary: 118 mg/dL — ABNORMAL HIGH (ref 70–99)
Glucose-Capillary: 120 mg/dL — ABNORMAL HIGH (ref 70–99)
Glucose-Capillary: 114 mg/dL — ABNORMAL HIGH (ref 70–99)

## 2024-03-14 MED ORDER — FOLIC ACID 1 MG PO TABS
1.0000 mg | ORAL_TABLET | Freq: Every day | ORAL | Status: DC
  Administered 2024-03-14 – 2024-03-31 (×18): 1 mg via ORAL
  Filled 2024-03-14 (×18): qty 1

## 2024-03-14 MED ORDER — SENNOSIDES-DOCUSATE SODIUM 8.6-50 MG PO TABS
1.0000 | ORAL_TABLET | Freq: Every day | ORAL | Status: DC
  Administered 2024-03-14 – 2024-03-30 (×15): 1 via ORAL
  Filled 2024-03-14 (×16): qty 1

## 2024-03-14 MED ORDER — SORBITOL 70 % SOLN
30.0000 mL | Status: AC
  Administered 2024-03-14 – 2024-03-15 (×2): 30 mL via ORAL
  Filled 2024-03-14 (×2): qty 30

## 2024-03-14 MED ORDER — VANCOMYCIN HCL IN DEXTROSE 1-5 GM/200ML-% IV SOLN
1000.0000 mg | INTRAVENOUS | Status: DC
  Administered 2024-03-14 – 2024-03-17 (×4): 1000 mg via INTRAVENOUS
  Filled 2024-03-14 (×4): qty 200

## 2024-03-14 MED ORDER — POTASSIUM CHLORIDE 20 MEQ PO PACK
40.0000 meq | PACK | Freq: Once | ORAL | Status: AC
  Administered 2024-03-14: 40 meq via ORAL
  Filled 2024-03-14: qty 2

## 2024-03-14 NOTE — Progress Notes (Signed)
 "   Progress Note   Assessment    84 year old with history of hypertension, hyperlipidemia, diabetes, fatty liver, atrial fibrillation on Eliquis , admitted on 02/22/2024 with weakness after falling at home found to have left psoas abscess, hospital stay complicated by delirium.  We are consulted for elevated liver enzymes.  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  Elevated liver enzymes --now that CK is noted to be elevated, his AST and ALT etiology is likely muscle and not liver.  This is consistent with normal liver enzymes 3 days prior.  While drug-induced liver injury remains in the differential, it this is likely from muscle injury -- Can trend LFTs but would expect AST and ALT to improve as CK normalizes -- Avoid hepatotoxins where possible  2.  Elevated CK --likely daptomycin  induced; ID is switching therapy  GI will sign off, call if questions.  If liver enzymes fail to normalize with improving CK then patient should have GI follow-up.  35 minutes total spent today including patient facing time, coordination of care, reviewing medical history/procedures/pertinent radiology studies, and documentation of the encounter.    Chief Complaint   Patient denies complaint Just waking up in the last 30 minutes and waiting on breakfast No overnight events Son at bedside  Vital signs in last 24 hours: Temp:  [97.3 F (36.3 C)-97.5 F (36.4 C)] 97.3 F (36.3 C)  (01/17 1151) Pulse Rate:  [76-113] 78 (01/17 1151) Resp:  [16-17] 17 (01/17 1151) BP: (128-148)/(65-73) 130/65 (01/17 1151) SpO2:  [94 %-98 %] 98 % (01/17 1151) Last BM Date : 03/13/24 Gen: awake, alert, NAD HEENT: anicteric  Abd: soft, NT/ND, +BS throughout Ext: no c/c/e   Intake/Output from previous day: 01/16 0701 - 01/17 0700 In: 1437.8 [P.O.:100; I.V.:1237.8; IV Piggyback:100] Out: 700 [Urine:700] Intake/Output this shift: No intake/output data recorded.  Lab Results: Recent Labs    03/12/24 0412 03/13/24 0312 03/14/24 0131  WBC 6.8 7.2 5.8  HGB 8.0* 8.5* 8.2*  HCT 25.1* 26.9* 25.6*  PLT 187 195 178   BMET Recent Labs    03/12/24 0412 03/13/24 0312 03/14/24 0131  NA 142 141 142  K 3.2* 3.1* 3.4*  CL 112* 110 111  CO2 18* 21* 22  GLUCOSE 91 120* 118*  BUN 37* 32* 28*  CREATININE 2.09* 1.79* 1.60*  CALCIUM  9.1 9.0 9.0      Latest Ref Rng & Units 03/13/2024    3:12 AM 03/12/2024    4:12 AM 03/11/2024    3:57 AM  Hepatic Function  Total Protein 6.5 - 8.1 g/dL 5.3  5.2  5.2   Albumin  3.5 - 5.0 g/dL 2.5  2.6  2.6   AST 15 - 41 U/L 369  292  169   ALT 0 - 44 U/L 129  87  47   Alk Phosphatase 38 - 126 U/L 98  88  113   Total Bilirubin 0.0 - 1.2 mg/dL 0.6  0.6  0.7  PT/INR No results for input(s): LABPROT, INR in the last 72 hours. Hepatitis Panel Recent Labs    03/13/24 0312  HEPBSAG NON REACTIVE  HCVAB Reactive*  HEPAIGM NON REACTIVE  HEPBIGM NON REACTIVE    Studies/Results: US  Abdomen Limited RUQ (LIVER/GB) Result Date: 03/12/2024 CLINICAL DATA:  Transaminitis EXAM: ULTRASOUND ABDOMEN LIMITED RIGHT UPPER QUADRANT COMPARISON:  CT 11/16/2021 FINDINGS: Gallbladder: Surgically absent Common bile duct: Diameter: 5.4 mm Liver: Slightly echogenic liver parenchyma. Small cyst in the right hepatic lobe measuring 13 mm. Portal vein is patent on color Doppler imaging with normal direction of blood flow towards the liver. Other: Small right pleural  effusion incidentally noted IMPRESSION: 1. Status post cholecystectomy. No biliary dilatation. 2. Slightly echogenic liver parenchyma suggesting hepatic steatosis and or hepatocellular disease. 3. Small right pleural effusion. Electronically Signed   By: Luke Bun M.D.   On: 03/12/2024 15:37      LOS: 21 days   Gordy CHRISTELLA Starch, MD 03/14/2024, 12:21 PM See [facility], [facility], to contact our on call provider   "

## 2024-03-14 NOTE — Progress Notes (Signed)
 Pharmacy Antibiotic Note  Charles Lloyd. is a 84 y.o. male admitted on 02/22/2024 with psoas abscess and concern for involvement of lumbar hardware.  Pharmacy previously consulted for Daptomycin  dosing and to transition Cipro  back to Ertapenem  for gram negative coverage.   The patient have AKI now resolving, per renal suspect hemodynamic insults, AIN seems less likely but was transitioned off of Cefepime  to Ciprofloxacin  while undergoing work up. Ertapenem  OPAT placed with EOT 04/10/24  1/17 AM CK elevated at 3694. Per Dr. Fleeta Rothman, will switch from daptomycin  to vancomycin . If renal function worsens, will have to discontinue vancomycin  and start doxycyline.   Plan: - D/c Daptomycin  - Start Vancomycin  1000mg  IV q24H (eAUC 523.2, Scr used 1.60, Vd 0.72) - Continue Ertapenem  1g IV every 24 hours - Monitor renal function and clinical status   Height: 5' 8 (172.7 cm) Weight: 79.3 kg (174 lb 13.2 oz) IBW/kg (Calculated) : 68.4  Temp (24hrs), Avg:97.5 F (36.4 C), Min:97.3 F (36.3 C), Max:97.7 F (36.5 C)  Recent Labs  Lab 03/10/24 0352 03/11/24 0357 03/12/24 0412 03/13/24 0312 03/14/24 0131  WBC 9.7 7.7 6.8 7.2 5.8  CREATININE 2.60* 2.39* 2.09* 1.79* 1.60*    Estimated Creatinine Clearance: 35 mL/min (A) (by C-G formula based on SCr of 1.6 mg/dL (H)).    Allergies[1]  Antimicrobials this admission: Vancomycin  12/27 x 1; 1/17 >> Zosyn  12/27 >> 1/2 Linezolid  12/28 >> 1/2 Cefepime  1/2 >> 1/13 Daptomycin  1/2 >>1/16 Cipro  1/14 >> 1/16 Ertapenem  1/16 >>  Dose adjustments this admission: 1/16: Dapto 600mg  q48h to q24h  Microbiology results: 12/27 BCx >> NGf 12/29 fluid >> NGf  Thank you for allowing pharmacy to be a part of this patients care.   Marget Hench, PharmD Clinical Pharmacist 03/14/2024 9:23 AM         [1]  Allergies Allergen Reactions   Amlodipine  Nausea Only, Swelling and Other (See Comments)    Severe edema and chest pain- also   Ativan   [Lorazepam ] Other (See Comments)    Made the patient fidgety and he hallucinates   Chlorhexidine  Gluconate Itching and Other (See Comments)    Only itching with wipes, not with soap

## 2024-03-14 NOTE — Progress Notes (Signed)
" ° ° ° ° °  Patient with rising CK on dapto  Will switch to vancomycin   If problems with vancomycin  would swap in doxycycline  orally since this is empiric therapy  OPAT Orders Discharge antibiotics to be given via PICC line  Ertapenem  1gm q 24h Vancomycin  per pharmacy Aim for Vancomycin  trough 15-20 or AUC 400-550 (unless otherwise indicated)     Duration: 6 weeks End Date: 04/10/24   [facility] Care Per Protocol:   Home health RN for IV administration and teaching; PICC line care and labs.     Twice weekly labs  -BMP _x_ Vancomycin  trough  Labs weekly while on IV antibiotics: _x_ CBC with differential X LFTs _x_ CRP _x_ ESR     _x_ Please pull PIC at completion of IV antibiotics __ Please leave PIC in place until doctor has seen patient or been notified   Fax weekly labs to [phone]   Clinic Follow Up Appt: 1/20 @ 11am   @   [facility] [address], [place], [place] [address] Phone: [phone]    Charles Lloyd 03/14/2024, 9:08 AM  "

## 2024-03-14 NOTE — Plan of Care (Signed)
  Problem: Nutritional: Goal: Maintenance of adequate nutrition will improve Outcome: Progressing   Problem: Activity: Goal: Risk for activity intolerance will decrease Outcome: Progressing   

## 2024-03-14 NOTE — Progress Notes (Signed)
 " PROGRESS NOTE    Charles Lloyd Charles Lloyd.  FMW:[id number] DOB: 10-Nov-1940 DOA: 02/22/2024 PCP: Theophilus Andrews, Tully GRADE, MD    Chief Complaint  Patient presents with   Fall    Brief Narrative:  83yo with h/o HTN, HLD, T2DM, afib on Eliquis , and L4-5 spondylolisthesis s/p fusion on 12/11 who presented with LLE weakness and pain with falls. He was found to have a 3.5 x 2.4 cm L psoas muscle abscess. Neurosurgery recommended IR drainage and IV antibiotics. Underwent CT-guided aspiration on 02/24/24. Developed hospital-associated delirium requiring ICU transfer, now improved. Currently on daptomycin  and cefepime  till 04/13/2024 as per ID recommendations. Psychiatry consulted on 03/06/2024 for intermittent agitation and as per family request. Palliative care also consulted for goals of care discussion.    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 - Patient seen in consultation by IR and patient underwent CT-guided aspiration of left psoas fluid collection with drain placement. - No growth noted on fluid and blood cultures. - Patient was seen in consultation by ID and on daptomycin  and cefepime  with end date scheduled for 04/13/2024 however due to AKI patient was reassessed by ID and due to concerns for cefepime  being more likely to cause AIN, and myoclonus and as such cefepime  was  discontinued and ID recommended starting patient on ciprofloxacin . -Ciprofloxacin  being changed to ertapenem  1 g IV daily per ID. -EOT 04/10/2024. -Patient noted to have elevated CK levels and as such discussed with ID and IV daptomycin  will be exchanged for IV vancomycin . -ID was following but signed off as of 03/13/2024  2.  Acute metabolic encephalopathy/delirium/alcohol  withdrawal syndrome with delirium/possible underlying dementia -Patient initially required Precedex  drip which has been tapered off. - Status post completion of phenobarbital  taper. - MRI brain done on 02/24/2024 negative for any acute abnormalities. - Repeat CT head done 03/10/2024 negative for any acute intracranial abnormalities. - Patient with no respiratory symptoms. - Blood cultures obtained negative. - Ammonia level within normal limits at 19. - Vitamin B12 noted at 718. - TSH within normal limits at 2.510. - Folate level noted > 20. - EEG done suggestive of generalized cerebral dysfunction likely related to toxic metabolic etiology.  No seizures or epileptiform discharges were seen throughout the recording. -It is noted that son concerned about paradoxical response to Ativan  and requesting Ativan  be avoided which has been added as an allergy. -Patient also seen in consultation by psychiatry 03/06/2024 for intermittent agitation as per family request.  Patient was started on Rozerem  per psychiatry and patient subsequently became very sleepy with intermittent tremors and poor low oral intake that was abnormal was discontinued by psychiatry. - Patient improving clinically daily, more alert today and following some commands and alert and oriented x 3..   - Patient with some intermittent bouts of confusion.  -It is noted per Dr.Alekh that son wants to avoid MRI as  much as possible as patient might need more sedation to get it done. -Gabapentin  resumed but renally dosed today.  -As patient has improved clinically will not  repeat MRI at this time.  - Continue treatment as in problem #1. - Palliative care following.  3.  Acute kidney injury -Patient noted to have worsening renal function and fluid likely secondary to a prerenal azotemia in the setting of low BPs and concern for AIN in the setting of cefepime . -Renal ultrasound negative for hydronephrosis. - Patient seen in consultation by urology and cefepime  noted to have been discontinued 03/10/2024. -Renal function slowly trending down currently at 1.60 today. -ANA, ANCA negative.  SPEP pending. -Elevated free kappa light chains and elevated free lambda light chains however kappa lambda ratio 1.02 - Nephrology recommending continuation of supportive care with IV fluids of LR at 100 cc/h for the next 24 hours until patient with adequate oral intake. - Nephrology following.  4.  Hyponatremia -Resolved.  5.  Metabolic acidosis -Likely secondary to AKI. - Continue bicarb tablets. - Follow.  6.  Paroxysmal atrial fibrillation - Continue Coreg  and flecainide  for rate control. - Eliquis  for anticoagulation.  7.  Chronic diastolic heart failure/hypertension/hyperlipidemia -Currently compensated. - Continue hydralazine , Coreg . - Torsemide  resumed on 03/04/2024 but held on 03/07/2024 due to increasing creatinine/AKI. -Will hold torsemide  until outpatient follow-up with cardiology - Outpatient follow-up with cardiology. - Statin on hold due to transaminitis.  8.  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 - CBG noted at 119 this morning. -Continue gabapentin . - SSI.  9. asthma -Stable. - Montelukast .  10.  Recurrent falls -PT/OT. - Likely needs SNF.  11.  Acute on chronic urinary retention -Patient noted to have failed voiding trial on 03/04/2024 and Foley catheter had to be placed on 03/04/2024. - Continue Flomax . -Would likely be discharged with Foley catheter in place. - Will need  outpatient follow-up with urology.   12.  Hypomagnesemia -Repleted.  13.  Hypokalemia - Replete.  14.  Transaminitis -??  Etiology c - LFTs trending up over the past 72 hours . - Acute hepatitis negative for hep A IgM, hep B surface antigen, hep B core antibody IgM.  Hep C antibody reactive.   - Check hep C viral load. - Right upper quadrant ultrasound done with no biliary dilatation, status postcholecystectomy, slightly echogenic liver parenchyma suggesting hepatic steatosis, small right pleural effusion.   - Patient was on a statin which has been discontinued.   - Patient on daptomycin  however noted to have elevated CK levels, discussed with ID and daptomycin  will be substituted for IV vancomycin ..   - Ciprofloxacin  changed to Invanz  per ID.   - GI consulted and feels likely transaminitis secondary to DILI.  - CK level ordered per GI to exclude daptomycin  as that can cause muscle injury.  -CK levels elevated and daptomycin  substituted with vancomycin  in discussion with ID. - GI recommended to continue to trend enzymes, avoid all hepatotoxins.  - Appreciate GI input and recommendations.  15.  Anemia of chronic disease - Hemoglobin stable at 8.2. - Follow H&H.   16.  Thrombocytopenia -Resolved.   17. Pressure injury, Wound 02/24/24 1245 Pressure Injury Buttocks Mid;Lower Deep Tissue Pressure Injury - Purple or maroon localized area of discolored intact skin or blood-filled blister due to damage of underlying soft tissue from pressure and/or shear. (Active)     Wound 02/24/24 1245 Pressure Injury Scrotum Circumferential Deep Tissue Pressure Injury - Purple  or maroon localized area of discolored intact skin or blood-filled blister due to damage of underlying soft tissue from pressure and/or shear. (Active)       DVT prophylaxis: Eliquis  Code Status: DNR Family Communication: No family at bedside. Disposition: SNF when medically stable with clinical improvement.    Status is:  Inpatient Remains inpatient appropriate because: Severity of illness   Consultants:  Neurosurgery: Dr. Colon 02/23/2024 Interventional radiology: Dr. Jennefer 02/23/2024 PCCM Dr. Kara 02/24/2024 ID: Dr. Overton 02/28/2024 Psychiatry: Dr. Larina 03/06/2024 Palliative care Stephane Palin, NP 03/07/2024 Nephrology: Dr. Dennise 03/10/2024 Gastroenterology: Dr. Albertus 03/13/2024  Procedures:  CT pelvis 02/22/2024 CT L-spine 02/22/2024 CT head CT C-spine 02/22/2024 CT head 03/10/2024 Chest x-ray 02/22/2024 Plain films of the pelvis 02/22/2024 MRI brain 02/24/2024 MRI L-spine 02/22/2024 Renal ultrasound 02/28/2024 CT-guided aspiration of left psoas fluid collection per IR: Dr. Philip 02/24/2024 EEG 03/11/2024 Right upper quadrant ultrasound 03/12/2024  Antimicrobials:  Anti-infectives (From admission, onward)    Start     Dose/Rate Route Frequency Ordered Stop   03/14/24 1500  vancomycin  (VANCOCIN ) IVPB 1000 mg/200 mL premix        1,000 mg 200 mL/hr over 60 Minutes Intravenous Every 24 hours 03/14/24 0935     03/13/24 1400  DAPTOmycin  (CUBICIN ) 600 mg in sodium chloride  0.9 % IVPB  Status:  Discontinued        8 mg/kg  79.3 kg 124 mL/hr over 30 Minutes Intravenous Daily 03/13/24 1034 03/14/24 0908   03/13/24 1115  ertapenem  (INVANZ ) 1 g in sodium chloride  0.9 % 100 mL IVPB        1 g 200 mL/hr over 30 Minutes Intravenous Every 24 hours 03/13/24 1034     03/11/24 1000  ciprofloxacin  (CIPRO ) IVPB 400 mg  Status:  Discontinued        400 mg 200 mL/hr over 60 Minutes Intravenous Every 24 hours 03/10/24 1456 03/13/24 1034   03/10/24 1400  DAPTOmycin  (CUBICIN ) 600 mg in sodium chloride  0.9 % IVPB  Status:  Discontinued        8 mg/kg  79.3 kg 124 mL/hr over 30 Minutes Intravenous Every 48 hours 03/09/24 1013 03/13/24 1034   03/10/24 1000  ceFEPIme  (MAXIPIME ) 2 g in sodium chloride  0.9 % 100 mL IVPB  Status:  Discontinued        2 g 200 mL/hr over 30 Minutes Intravenous Every 24 hours 03/09/24 1013  03/10/24 1456   03/07/24 1800  ceFEPIme  (MAXIPIME ) 2 g in sodium chloride  0.9 % 100 mL IVPB  Status:  Discontinued        2 g 200 mL/hr over 30 Minutes Intravenous Every 12 hours 03/07/24 1118 03/09/24 1013   02/28/24 2200  ceFEPIme  (MAXIPIME ) 2 g in sodium chloride  0.9 % 100 mL IVPB  Status:  Discontinued        2 g 200 mL/hr over 30 Minutes Intravenous Every 8 hours 02/28/24 1440 03/07/24 1118   02/28/24 1530  DAPTOmycin  (CUBICIN ) 600 mg in sodium chloride  0.9 % IVPB  Status:  Discontinued        8 mg/kg  79.3 kg 124 mL/hr over 30 Minutes Intravenous Daily 02/28/24 1440 03/09/24 1013   02/28/24 0000  daptomycin  (CUBICIN ) IVPB  Status:  Discontinued        600 mg Intravenous Every 24 hours 02/28/24 1444 03/09/24    02/28/24 0000  ceFEPime  (MAXIPIME ) IVPB  Status:  Discontinued        2 g Intravenous Every 8 hours 02/28/24 1444 03/09/24  02/23/24 2200  vancomycin  (VANCOREADY) IVPB 1500 mg/300 mL  Status:  Discontinued        1,500 mg 150 mL/hr over 120 Minutes Intravenous Every 24 hours 02/22/24 2348 02/23/24 0242   02/23/24 1000  linezolid  (ZYVOX ) IVPB 600 mg  Status:  Discontinued       Note to Pharmacy: Pt developed rash shortly after vancomycin  administration.   600 mg 300 mL/hr over 60 Minutes Intravenous Every 12 hours 02/23/24 0242 02/28/24 1440   02/23/24 0800  piperacillin -tazobactam (ZOSYN ) IVPB 3.375 g  Status:  Discontinued        3.375 g 12.5 mL/hr over 240 Minutes Intravenous Every 8 hours 02/22/24 2348 02/28/24 1440   02/22/24 2315  piperacillin -tazobactam (ZOSYN ) IVPB 3.375 g        3.375 g 100 mL/hr over 30 Minutes Intravenous  Once 02/22/24 2306 02/23/24 0013   02/22/24 2315  vancomycin  (VANCOREADY) IVPB 1500 mg/300 mL        1,500 mg 150 mL/hr over 120 Minutes Intravenous  Once 02/22/24 2306 02/23/24 0141         Subjective: [name] in bed.  Patient alert and oriented to self place and time.  Knows who the president is.  Patient with complaints of  constipation.   Objective: Vitals:   03/13/24 2043 03/13/24 2044 03/14/24 0642 03/14/24 1151  BP: (!) 148/73  128/65 130/65  Pulse: (!) 113 88 76 78  Resp: 16  16 17   Temp: (!) 97.5 F (36.4 C)  (!) 97.3 F (36.3 C) (!) 97.3 F (36.3 C)  TempSrc: Oral  Oral Oral  SpO2: 94% 97% 98% 98%  Weight:      Height:        Intake/Output Summary (Last 24 hours) at 03/14/2024 1349 Last data filed at 03/14/2024 0700 Gross per 24 hour  Intake 1437.75 ml  Output 700 ml  Net 737.75 ml   Filed Weights   02/22/24 1238 02/24/24 1245  Weight: 79.4 kg 79.3 kg    Examination:  General exam: NAD. Respiratory system: CTAB.  No wheezes, no crackles, no rhonchi.  Fair air movement.  Speaking in full sentences.   Cardiovascular system: Regular rate rhythm no murmurs rubs or gallops.  No JVD.  No lower extremity edema. Gastrointestinal system: Abdomen soft, nontender, nondistended, positive bowel sounds.  No rebound.  No guarding.  Central nervous system: Alert and oriented x 3.  Moving extremities spontaneously.  No focal neurological deficits. Extremities: Symmetric 5 x 5 power. Skin: No rashes, lesions or ulcers Psychiatry: Judgement and insight poor to fair. Mood & affect appropriate.     Data Reviewed: I have personally reviewed following labs and imaging studies  CBC: Recent Labs  Lab 03/10/24 0352 03/11/24 0357 03/12/24 0412 03/13/24 0312 03/14/24 0131  WBC 9.7 7.7 6.8 7.2 5.8  NEUTROABS 7.3 5.5 4.7  --   --   HGB 8.3* 7.7* 8.0* 8.5* 8.2*  HCT 25.5* 23.5* 25.1* 26.9* 25.6*  MCV 90.7 90.4 91.3 91.8 91.1  PLT 171 178 187 195 178    Basic Metabolic Panel: Recent Labs  Lab 03/08/24 0304 03/09/24 0304 03/10/24 0352 03/11/24 0357 03/12/24 0412 03/13/24 0312 03/14/24 0131  NA 133* 137 141 144 142 141 142  K 3.6 3.6 3.4* 3.1* 3.2* 3.1* 3.4*  CL 97* 102 108 111 112* 110 111  CO2 23 20* 19* 19* 18* 21* 22  GLUCOSE 109* 125* 113* 89 91 120* 118*  BUN 19 32* 42* 41* 37* 32*  28*  CREATININE 1.58* 2.33* 2.60* 2.39* 2.09* 1.79* 1.60*  CALCIUM  9.6 8.7* 8.5* 8.8* 9.1 9.0 9.0  MG 1.9 1.8 1.8 1.9 1.8  --   --     GFR: Estimated Creatinine Clearance: 35 mL/min (A) (by C-G formula based on SCr of 1.6 mg/dL (H)).  Liver Function Tests: Recent Labs  Lab 03/10/24 0352 03/11/24 0357 03/12/24 0412 03/13/24 0312 03/14/24 1210  AST 104* 169* 292* 369* 413*  ALT 26 47* 87* 129* 169*  ALKPHOS 109 113 88 98 88  BILITOT 0.7 0.7 0.6 0.6 0.4  PROT 5.4* 5.2* 5.2* 5.3* 4.9*  ALBUMIN  2.7* 2.6* 2.6* 2.5* 2.4*    CBG: Recent Labs  Lab 03/13/24 1152 03/13/24 1615 03/13/24 2039 03/14/24 0734 03/14/24 1149  GLUCAP 156* 134* 118* 119* 118*     Recent Results (from the past 240 hours)  MRSA Next Gen by PCR, Nasal     Status: None   Collection Time: 03/11/24  5:49 PM   Specimen: Nasal Mucosa; Nasal Swab  Result Value Ref Range Status   MRSA by PCR Next Gen NOT DETECTED NOT DETECTED Final    Comment: (NOTE) The GeneXpert MRSA Assay (FDA approved for NASAL specimens only), is one component of a comprehensive MRSA colonization surveillance program. It is not intended to diagnose MRSA infection nor to guide or monitor treatment for MRSA infections. Test performance is not FDA approved in patients less than 58 years old. Performed at [facility], [address], [place], [place] [address]          Radiology Studies: US  Abdomen Limited RUQ (LIVER/GB) Result Date: 03/12/2024 CLINICAL DATA:  Transaminitis EXAM: ULTRASOUND ABDOMEN LIMITED RIGHT UPPER QUADRANT COMPARISON:  CT 11/16/2021 FINDINGS: Gallbladder: Surgically absent Common bile duct: Diameter: 5.4 mm Liver: Slightly echogenic liver parenchyma. Small cyst in the right hepatic lobe measuring 13 mm. Portal vein is patent on color Doppler imaging with normal direction of blood flow towards the liver. Other: Small right pleural effusion incidentally noted IMPRESSION: 1. Status post  cholecystectomy. No biliary dilatation. 2. Slightly echogenic liver parenchyma suggesting hepatic steatosis and or hepatocellular disease. 3. Small right pleural effusion. Electronically Signed   By: Luke Bun M.D.   On: 03/12/2024 15:37        Scheduled Meds:  allopurinol   100 mg Oral Daily   apixaban   2.5 mg Oral BID   carvedilol   3.125 mg Oral BID   Chlorhexidine  Gluconate Cloth  6 each Topical Daily   cycloSPORINE   1 drop Both Eyes Daily   flecainide   50 mg Oral BID   folic acid   1 mg Oral Daily   gabapentin   300 mg Oral TID   hydrALAZINE   25 mg Oral BID   hydrocortisone  cream   Topical TID   insulin  aspart  0-6 Units Subcutaneous TID AC & HS   montelukast   10 mg Oral QHS   sodium bicarbonate   650 mg Oral BID   sodium chloride  flush  3 mL Intravenous Q12H   tamsulosin   0.4 mg Oral QPC supper   thiamine   100 mg Oral Daily   Continuous Infusions:  ertapenem  1 g (03/14/24 1107)   lactated ringers  100 mL/hr at 03/14/24 1108   vancomycin        LOS: 21 days    Time spent: 40 minutes    Toribio Hummer, MD [facility]   To contact the attending provider between 7A-7P or the covering provider during after hours 7P-7A, please log into the  web site [email or web address] and access using universal [place] password for that web site. If you do not have the password, please call the hospital operator.  03/14/2024, 1:49 PM    "

## 2024-03-15 DIAGNOSIS — R339 Retention of urine, unspecified: Secondary | ICD-10-CM | POA: Diagnosis not present

## 2024-03-15 DIAGNOSIS — R410 Disorientation, unspecified: Secondary | ICD-10-CM | POA: Diagnosis not present

## 2024-03-15 DIAGNOSIS — K6812 Psoas muscle abscess: Secondary | ICD-10-CM | POA: Diagnosis not present

## 2024-03-15 DIAGNOSIS — E871 Hypo-osmolality and hyponatremia: Secondary | ICD-10-CM | POA: Diagnosis not present

## 2024-03-15 LAB — CBC
HCT: 24.1 % — ABNORMAL LOW (ref 39.0–52.0)
Hemoglobin: 7.8 g/dL — ABNORMAL LOW (ref 13.0–17.0)
MCH: 29.2 pg (ref 26.0–34.0)
MCHC: 32.4 g/dL (ref 30.0–36.0)
MCV: 90.3 fL (ref 80.0–100.0)
Platelets: 185 K/uL (ref 150–400)
RBC: 2.67 MIL/uL — ABNORMAL LOW (ref 4.22–5.81)
RDW: 18.3 % — ABNORMAL HIGH (ref 11.5–15.5)
WBC: 4.7 K/uL (ref 4.0–10.5)
nRBC: 0 % (ref 0.0–0.2)

## 2024-03-15 LAB — COMPREHENSIVE METABOLIC PANEL WITH GFR
ALT: 192 U/L — ABNORMAL HIGH (ref 0–44)
AST: 441 U/L — ABNORMAL HIGH (ref 15–41)
Albumin: 2.4 g/dL — ABNORMAL LOW (ref 3.5–5.0)
Alkaline Phosphatase: 84 U/L (ref 38–126)
Anion gap: 8 (ref 5–15)
BUN: 25 mg/dL — ABNORMAL HIGH (ref 8–23)
CO2: 23 mmol/L (ref 22–32)
Calcium: 9 mg/dL (ref 8.9–10.3)
Chloride: 110 mmol/L (ref 98–111)
Creatinine, Ser: 1.59 mg/dL — ABNORMAL HIGH (ref 0.61–1.24)
GFR, Estimated: 43 mL/min — ABNORMAL LOW
Glucose, Bld: 112 mg/dL — ABNORMAL HIGH (ref 70–99)
Potassium: 3.6 mmol/L (ref 3.5–5.1)
Sodium: 141 mmol/L (ref 135–145)
Total Bilirubin: 0.4 mg/dL (ref 0.0–1.2)
Total Protein: 4.7 g/dL — ABNORMAL LOW (ref 6.5–8.1)

## 2024-03-15 LAB — HCV RNA QUANT RFLX ULTRA OR GENOTYP
HCV RNA Qnt(log copy/mL): UNDETERMINED {Log_IU}/mL
HepC Qn: NOT DETECTED [IU]/mL

## 2024-03-15 LAB — GLUCOSE, CAPILLARY
Glucose-Capillary: 111 mg/dL — ABNORMAL HIGH (ref 70–99)
Glucose-Capillary: 92 mg/dL (ref 70–99)
Glucose-Capillary: 88 mg/dL (ref 70–99)
Glucose-Capillary: 96 mg/dL (ref 70–99)

## 2024-03-15 NOTE — Progress Notes (Signed)
 " PROGRESS NOTE    Charles Lloyd Juanito Mickey.  FMW:[id number] DOB: 10/10/1940 DOA: 02/22/2024 PCP: Theophilus Andrews, Tully GRADE, MD    Chief Complaint  Patient presents with   Fall    Brief Narrative:  84yo with h/o HTN, HLD, T2DM, afib on Eliquis , and L4-5 spondylolisthesis s/p fusion on 12/11 who presented with LLE weakness and pain with falls. He was found to have a 3.5 x 2.4 cm L psoas muscle abscess. Neurosurgery recommended IR drainage and IV antibiotics. Underwent CT-guided aspiration on 02/24/24. Developed hospital-associated delirium requiring ICU transfer, now improved. Currently on daptomycin  and cefepime  till 04/13/2024 as per ID recommendations. Psychiatry consulted on 03/06/2024 for intermittent agitation and as per family request. Palliative care also consulted for goals of care discussion.    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 - Patient seen in consultation by IR and patient underwent CT-guided aspiration of left psoas fluid collection with drain placement. - No growth noted on fluid and blood cultures. - Patient was seen in consultation by ID and on daptomycin  and cefepime  with end date scheduled for 04/13/2024 however due to AKI patient was reassessed by ID and due to concerns for cefepime  being more likely to cause AIN, and myoclonus and as such cefepime  was  discontinued and ID recommended starting patient on ciprofloxacin . -Ciprofloxacin  being changed to ertapenem  1 g IV daily per ID. -EOT 04/10/2024. -Patient noted to have elevated CK levels and as such discussed with ID and IV daptomycin  will be exchanged for IV vancomycin . -ID was following but signed off as of 03/13/2024  2.  Acute metabolic encephalopathy/delirium/alcohol  withdrawal syndrome with delirium/possible underlying dementia -Patient initially required Precedex  drip which has been tapered off. - Status post completion of phenobarbital  taper. - MRI brain done on 02/24/2024 negative for any acute abnormalities. - Repeat CT head done 03/10/2024 negative for any acute intracranial abnormalities. - Patient with no respiratory symptoms. - Blood cultures obtained negative. - Ammonia level within normal limits at 19. - Vitamin B12 noted at 718. - TSH within normal limits at 2.510. - Folate level noted > 20. - EEG done suggestive of generalized cerebral dysfunction likely related to toxic metabolic etiology.  No seizures or epileptiform discharges were seen throughout the recording. -It is noted that son concerned about paradoxical response to Ativan  and requesting Ativan  be avoided which has been added as an allergy. -Patient also seen in consultation by psychiatry 03/06/2024 for intermittent agitation as per family request.  Patient was started on Rozerem  per psychiatry and patient subsequently became very sleepy with intermittent tremors and poor low oral intake that was abnormal was discontinued by psychiatry. - Patient improving clinically daily, more alert over the past few days was following commands . - Patient sleeping today somewhat drowsy but arousable.  - Patient with some intermittent bouts of confusion.  -It is noted per Dr.Alekh that son  wants to avoid MRI as much as possible as patient might need more sedation to get it done. -Gabapentin  resumed but renally dosed today.  -As  patient has improving clinically in general, will not repeat MRI at this time.  - Continue treatment as in problem #1. - Palliative care following.  3.  Acute kidney injury -Patient noted to have worsening renal function and fluid likely secondary to a prerenal azotemia in the setting of low BPs and concern for AIN in the setting of cefepime . -Renal ultrasound negative for hydronephrosis. - Patient seen in consultation by urology and cefepime  noted to have been discontinued 03/10/2024. -Renal function slowly trending down currently at 1.60 today. -ANA, ANCA negative.  SPEP pending. -Elevated free kappa light chains and elevated free lambda light chains however kappa lambda ratio 1.02 - Nephrology recommending continuation of supportive care with IV fluids of LR at 100 cc/h for the next 24 hours until patient with adequate oral intake. - Nephrology following.  4.  Hyponatremia -Resolved.  5.  Metabolic acidosis -Likely secondary to AKI. - Continue bicarb tablets for another 24 hours and discontinue. - Follow.  6.  Paroxysmal atrial fibrillation - Continue Coreg  and flecainide  for rate control. - Eliquis  for anticoagulation.  7.  Chronic diastolic heart failure/hypertension/hyperlipidemia -Currently compensated. - Continue hydralazine , Coreg . - Torsemide  resumed on 03/04/2024 but held on 03/07/2024 due to increasing creatinine/AKI. - Continue to hold torsemide  until outpatient follow-up with cardiology - Outpatient follow-up with cardiology. - Statin on hold due to transaminitis.  8.  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 - CBG noted at 111 this morning. -Continue gabapentin . - SSI.  9. asthma -Stable. - Montelukast .  10.  Recurrent falls -PT/OT. - Likely needs SNF.  11.  Acute on chronic urinary retention -Patient noted to have failed voiding trial on 03/04/2024 and Foley catheter had to be placed on  03/04/2024. - Continue Flomax . -Would likely be discharged with Foley catheter in place. - Will need outpatient follow-up with urology.   12.  Hypomagnesemia -Repleted.  13.  Hypokalemia - Repleted.  14.  Transaminitis -??  Etiology c - LFTs trended up over a 72 hour period. - Acute hepatitis negative for hep A IgM, hep B surface antigen, hep B core antibody IgM.  Hep C antibody reactive.   - Hep C viral load pending. - Right upper quadrant ultrasound done with no biliary dilatation, status postcholecystectomy, slightly echogenic liver parenchyma suggesting hepatic steatosis, small right pleural effusion.   - Patient was on a statin which has been discontinued.   - Patient on daptomycin  however noted to have elevated CK levels, discussed with ID and daptomycin  substituted for IV vancomycin ..   - Ciprofloxacin  changed to Invanz  per ID.   - GI consulted and feels likely transaminitis secondary to DILI.  - CK level ordered per GI to exclude daptomycin  as that can cause muscle injury.  -CK levels elevated and daptomycin  substituted with vancomycin  in discussion with ID. - GI recommended to continue to trend enzymes, avoid all hepatotoxins.  - Appreciate GI input and recommendations.  15.  Anemia of chronic disease - Hemoglobin stable at 7.8. - Follow H&H.   16.  Thrombocytopenia -Resolved.   17. Pressure injury, Wound 02/24/24 1245 Pressure Injury Buttocks Mid;Lower Deep Tissue Pressure Injury - Purple or maroon localized area of discolored intact skin or blood-filled blister due to damage of underlying soft tissue from pressure and/or shear. (Active)     Wound  02/24/24 1245 Pressure Injury Scrotum Circumferential Deep Tissue Pressure Injury - Purple or maroon localized area of discolored intact skin or blood-filled blister due to damage of underlying soft tissue from pressure and/or shear. (Active)       DVT prophylaxis: Eliquis  Code Status: DNR Family Communication: No family  at bedside. Disposition: SNF when medically stable with clinical improvement.    Status is: Inpatient Remains inpatient appropriate because: Severity of illness   Consultants:  Neurosurgery: Dr. Colon 02/23/2024 Interventional radiology: Dr. Jennefer 02/23/2024 PCCM Dr. Kara 02/24/2024 ID: Dr. Overton 02/28/2024 Psychiatry: Dr. Larina 03/06/2024 Palliative care Stephane Palin, NP 03/07/2024 Nephrology: Dr. Dennise 03/10/2024 Gastroenterology: Dr. Albertus 03/13/2024  Procedures:  CT pelvis 02/22/2024 CT L-spine 02/22/2024 CT head CT C-spine 02/22/2024 CT head 03/10/2024 Chest x-ray 02/22/2024 Plain films of the pelvis 02/22/2024 MRI brain 02/24/2024 MRI L-spine 02/22/2024 Renal ultrasound 02/28/2024 CT-guided aspiration of left psoas fluid collection per IR: Dr. Philip 02/24/2024 EEG 03/11/2024 Right upper quadrant ultrasound 03/12/2024  Antimicrobials:  Anti-infectives (From admission, onward)    Start     Dose/Rate Route Frequency Ordered Stop   03/14/24 1500  vancomycin  (VANCOCIN ) IVPB 1000 mg/200 mL premix        1,000 mg 200 mL/hr over 60 Minutes Intravenous Every 24 hours 03/14/24 0935     03/13/24 1400  DAPTOmycin  (CUBICIN ) 600 mg in sodium chloride  0.9 % IVPB  Status:  Discontinued        8 mg/kg  79.3 kg 124 mL/hr over 30 Minutes Intravenous Daily 03/13/24 1034 03/14/24 0908   03/13/24 1115  ertapenem  (INVANZ ) 1 g in sodium chloride  0.9 % 100 mL IVPB        1 g 200 mL/hr over 30 Minutes Intravenous Every 24 hours 03/13/24 1034     03/11/24 1000  ciprofloxacin  (CIPRO ) IVPB 400 mg  Status:  Discontinued        400 mg 200 mL/hr over 60 Minutes Intravenous Every 24 hours 03/10/24 1456 03/13/24 1034   03/10/24 1400  DAPTOmycin  (CUBICIN ) 600 mg in sodium chloride  0.9 % IVPB  Status:  Discontinued        8 mg/kg  79.3 kg 124 mL/hr over 30 Minutes Intravenous Every 48 hours 03/09/24 1013 03/13/24 1034   03/10/24 1000  ceFEPIme  (MAXIPIME ) 2 g in sodium chloride  0.9 % 100 mL IVPB  Status:   Discontinued        2 g 200 mL/hr over 30 Minutes Intravenous Every 24 hours 03/09/24 1013 03/10/24 1456   03/07/24 1800  ceFEPIme  (MAXIPIME ) 2 g in sodium chloride  0.9 % 100 mL IVPB  Status:  Discontinued        2 g 200 mL/hr over 30 Minutes Intravenous Every 12 hours 03/07/24 1118 03/09/24 1013   02/28/24 2200  ceFEPIme  (MAXIPIME ) 2 g in sodium chloride  0.9 % 100 mL IVPB  Status:  Discontinued        2 g 200 mL/hr over 30 Minutes Intravenous Every 8 hours 02/28/24 1440 03/07/24 1118   02/28/24 1530  DAPTOmycin  (CUBICIN ) 600 mg in sodium chloride  0.9 % IVPB  Status:  Discontinued        8 mg/kg  79.3 kg 124 mL/hr over 30 Minutes Intravenous Daily 02/28/24 1440 03/09/24 1013   02/28/24 0000  daptomycin  (CUBICIN ) IVPB  Status:  Discontinued        600 mg Intravenous Every 24 hours 02/28/24 1444 03/09/24    02/28/24 0000  ceFEPime  (MAXIPIME ) IVPB  Status:  Discontinued  2 g Intravenous Every 8 hours 02/28/24 1444 03/09/24    02/23/24 2200  vancomycin  (VANCOREADY) IVPB 1500 mg/300 mL  Status:  Discontinued        1,500 mg 150 mL/hr over 120 Minutes Intravenous Every 24 hours 02/22/24 2348 02/23/24 0242   02/23/24 1000  linezolid  (ZYVOX ) IVPB 600 mg  Status:  Discontinued       Note to Pharmacy: Pt developed rash shortly after vancomycin  administration.   600 mg 300 mL/hr over 60 Minutes Intravenous Every 12 hours 02/23/24 0242 02/28/24 1440   02/23/24 0800  piperacillin -tazobactam (ZOSYN ) IVPB 3.375 g  Status:  Discontinued        3.375 g 12.5 mL/hr over 240 Minutes Intravenous Every 8 hours 02/22/24 2348 02/28/24 1440   02/22/24 2315  piperacillin -tazobactam (ZOSYN ) IVPB 3.375 g        3.375 g 100 mL/hr over 30 Minutes Intravenous  Once 02/22/24 2306 02/23/24 0013   02/22/24 2315  vancomycin  (VANCOREADY) IVPB 1500 mg/300 mL        1,500 mg 150 mL/hr over 120 Minutes Intravenous  Once 02/22/24 2306 02/23/24 0141         Subjective: Seen sleeping, drowsy however arousable and  drifts back off to sleep.  Patient denies any chest pain or shortness of breath.  Patient following some commands.  Patient noted to have had bowel movement last night per RN.    Objective: Vitals:   03/14/24 1151 03/14/24 2028 03/15/24 0509 03/15/24 1302  BP: 130/65 (!) 143/72 118/64 (!) 127/52  Pulse: 78 92 84 89  Resp: 17 19 19 20   Temp: (!) 97.3 F (36.3 C) 98.4 F (36.9 C) 99.1 F (37.3 C) 98.8 F (37.1 C)  TempSrc: Oral Oral Oral Oral  SpO2: 98% 100% 96% 93%  Weight:      Height:        Intake/Output Summary (Last 24 hours) at 03/15/2024 1357 Last data filed at 03/15/2024 9364 Gross per 24 hour  Intake 1502.71 ml  Output 900 ml  Net 602.71 ml   Filed Weights   02/22/24 1238 02/24/24 1245  Weight: 79.4 kg 79.3 kg    Examination:  General exam: NAD. Respiratory system: Lungs clear to auscultation bilaterally anterior lung fields.  No wheezes, no crackles, no rhonchi.  Fair air movement.  No use of accessory muscles of respiration.  Cardiovascular system: RRR no murmurs rubs or gallops.  No JVD.  No lower extremity edema.  Gastrointestinal system: Abdomen soft, nontender, nondistended, positive bowel sounds.  No rebound.  No guarding.  Central nervous system: Sleeping..  Moving extremities spontaneously.  No focal neurological deficits. Extremities: Symmetric 5 x 5 power. Skin: No rashes, lesions or ulcers Psychiatry: Judgement and insight poor to fair. Mood & affect appropriate.     Data Reviewed: I have personally reviewed following labs and imaging studies  CBC: Recent Labs  Lab 03/10/24 0352 03/11/24 0357 03/12/24 0412 03/13/24 0312 03/14/24 0131 03/15/24 0346  WBC 9.7 7.7 6.8 7.2 5.8 4.7  NEUTROABS 7.3 5.5 4.7  --   --   --   HGB 8.3* 7.7* 8.0* 8.5* 8.2* 7.8*  HCT 25.5* 23.5* 25.1* 26.9* 25.6* 24.1*  MCV 90.7 90.4 91.3 91.8 91.1 90.3  PLT 171 178 187 195 178 185    Basic Metabolic Panel: Recent Labs  Lab 03/09/24 0304 03/10/24 0352  03/11/24 0357 03/12/24 0412 03/13/24 0312 03/14/24 0131 03/15/24 0346  NA 137 141 144 142 141 142 141  K 3.6 3.4*  3.1* 3.2* 3.1* 3.4* 3.6  CL 102 108 111 112* 110 111 110  CO2 20* 19* 19* 18* 21* 22 23  GLUCOSE 125* 113* 89 91 120* 118* 112*  BUN 32* 42* 41* 37* 32* 28* 25*  CREATININE 2.33* 2.60* 2.39* 2.09* 1.79* 1.60* 1.59*  CALCIUM  8.7* 8.5* 8.8* 9.1 9.0 9.0 9.0  MG 1.8 1.8 1.9 1.8  --   --   --     GFR: Estimated Creatinine Clearance: 35.3 mL/min (A) (by C-G formula based on SCr of 1.59 mg/dL (H)).  Liver Function Tests: Recent Labs  Lab 03/11/24 0357 03/12/24 0412 03/13/24 0312 03/14/24 1210 03/15/24 0346  AST 169* 292* 369* 413* 441*  ALT 47* 87* 129* 169* 192*  ALKPHOS 113 88 98 88 84  BILITOT 0.7 0.6 0.6 0.4 0.4  PROT 5.2* 5.2* 5.3* 4.9* 4.7*  ALBUMIN  2.6* 2.6* 2.5* 2.4* 2.4*    CBG: Recent Labs  Lab 03/14/24 1149 03/14/24 1549 03/14/24 2025 03/15/24 0758 03/15/24 1216  GLUCAP 118* 120* 114* 111* 92     Recent Results (from the past 240 hours)  MRSA Next Gen by PCR, Nasal     Status: None   Collection Time: 03/11/24  5:49 PM   Specimen: Nasal Mucosa; Nasal Swab  Result Value Ref Range Status   MRSA by PCR Next Gen NOT DETECTED NOT DETECTED Final    Comment: (NOTE) The GeneXpert MRSA Assay (FDA approved for NASAL specimens only), is one component of a comprehensive MRSA colonization surveillance program. It is not intended to diagnose MRSA infection nor to guide or monitor treatment for MRSA infections. Test performance is not FDA approved in patients less than 4 years old. Performed at [facility], [address], [place], [place] [address]          Radiology Studies: No results found.       Scheduled Meds:  allopurinol   100 mg Oral Daily   apixaban   2.5 mg Oral BID   carvedilol   3.125 mg Oral BID   Chlorhexidine  Gluconate Cloth  6 each Topical Daily   cycloSPORINE   1 drop Both Eyes Daily   flecainide   50  mg Oral BID   folic acid   1 mg Oral Daily   gabapentin   300 mg Oral TID   hydrALAZINE   25 mg Oral BID   hydrocortisone  cream   Topical TID   insulin  aspart  0-6 Units Subcutaneous TID AC & HS   montelukast   10 mg Oral QHS   senna-docusate  1 tablet Oral QHS   sodium bicarbonate   650 mg Oral BID   sodium chloride  flush  3 mL Intravenous Q12H   tamsulosin   0.4 mg Oral QPC supper   thiamine   100 mg Oral Daily   Continuous Infusions:  ertapenem  1 g (03/14/24 1107)   vancomycin  1,000 mg (03/14/24 1518)     LOS: 22 days    Time spent: 40 minutes    Toribio Hummer, MD [facility]   To contact the attending provider between 7A-7P or the covering provider during after hours 7P-7A, please log into the web site [email or web address] and access using universal [place] password for that web site. If you do not have the password, please call the hospital operator.  03/15/2024, 1:57 PM    "

## 2024-03-16 DIAGNOSIS — Z515 Encounter for palliative care: Secondary | ICD-10-CM | POA: Diagnosis not present

## 2024-03-16 DIAGNOSIS — E1142 Type 2 diabetes mellitus with diabetic polyneuropathy: Secondary | ICD-10-CM | POA: Diagnosis not present

## 2024-03-16 DIAGNOSIS — K6812 Psoas muscle abscess: Secondary | ICD-10-CM | POA: Diagnosis not present

## 2024-03-16 DIAGNOSIS — I5032 Chronic diastolic (congestive) heart failure: Secondary | ICD-10-CM | POA: Diagnosis not present

## 2024-03-16 DIAGNOSIS — F10931 Alcohol use, unspecified with withdrawal delirium: Secondary | ICD-10-CM | POA: Diagnosis not present

## 2024-03-16 DIAGNOSIS — G6289 Other specified polyneuropathies: Secondary | ICD-10-CM | POA: Diagnosis not present

## 2024-03-16 DIAGNOSIS — R339 Retention of urine, unspecified: Secondary | ICD-10-CM | POA: Diagnosis not present

## 2024-03-16 DIAGNOSIS — R4182 Altered mental status, unspecified: Secondary | ICD-10-CM

## 2024-03-16 DIAGNOSIS — I480 Paroxysmal atrial fibrillation: Secondary | ICD-10-CM | POA: Diagnosis not present

## 2024-03-16 DIAGNOSIS — Z7189 Other specified counseling: Secondary | ICD-10-CM | POA: Diagnosis not present

## 2024-03-16 DIAGNOSIS — J4530 Mild persistent asthma, uncomplicated: Secondary | ICD-10-CM | POA: Diagnosis not present

## 2024-03-16 DIAGNOSIS — I10 Essential (primary) hypertension: Secondary | ICD-10-CM | POA: Diagnosis not present

## 2024-03-16 DIAGNOSIS — R296 Repeated falls: Secondary | ICD-10-CM | POA: Diagnosis not present

## 2024-03-16 DIAGNOSIS — E871 Hypo-osmolality and hyponatremia: Secondary | ICD-10-CM | POA: Diagnosis not present

## 2024-03-16 LAB — COMPREHENSIVE METABOLIC PANEL WITH GFR
ALT: 173 U/L — ABNORMAL HIGH (ref 0–44)
AST: 305 U/L — ABNORMAL HIGH (ref 15–41)
Albumin: 2.4 g/dL — ABNORMAL LOW (ref 3.5–5.0)
Alkaline Phosphatase: 86 U/L (ref 38–126)
Anion gap: 9 (ref 5–15)
BUN: 24 mg/dL — ABNORMAL HIGH (ref 8–23)
CO2: 21 mmol/L — ABNORMAL LOW (ref 22–32)
Calcium: 9 mg/dL (ref 8.9–10.3)
Chloride: 111 mmol/L (ref 98–111)
Creatinine, Ser: 1.73 mg/dL — ABNORMAL HIGH (ref 0.61–1.24)
GFR, Estimated: 39 mL/min — ABNORMAL LOW
Glucose, Bld: 104 mg/dL — ABNORMAL HIGH (ref 70–99)
Potassium: 3.5 mmol/L (ref 3.5–5.1)
Sodium: 142 mmol/L (ref 135–145)
Total Bilirubin: 0.5 mg/dL (ref 0.0–1.2)
Total Protein: 5.1 g/dL — ABNORMAL LOW (ref 6.5–8.1)

## 2024-03-16 LAB — AMMONIA: Ammonia: 19 umol/L (ref 9–35)

## 2024-03-16 LAB — CBC
HCT: 25 % — ABNORMAL LOW (ref 39.0–52.0)
Hemoglobin: 8 g/dL — ABNORMAL LOW (ref 13.0–17.0)
MCH: 29.3 pg (ref 26.0–34.0)
MCHC: 32 g/dL (ref 30.0–36.0)
MCV: 91.6 fL (ref 80.0–100.0)
Platelets: 214 K/uL (ref 150–400)
RBC: 2.73 MIL/uL — ABNORMAL LOW (ref 4.22–5.81)
RDW: 18.9 % — ABNORMAL HIGH (ref 11.5–15.5)
WBC: 5.2 K/uL (ref 4.0–10.5)
nRBC: 0 % (ref 0.0–0.2)

## 2024-03-16 LAB — CK: Total CK: 1065 U/L — ABNORMAL HIGH (ref 49–397)

## 2024-03-16 LAB — GLUCOSE, CAPILLARY
Glucose-Capillary: 86 mg/dL (ref 70–99)
Glucose-Capillary: 124 mg/dL — ABNORMAL HIGH (ref 70–99)
Glucose-Capillary: 95 mg/dL (ref 70–99)
Glucose-Capillary: 117 mg/dL — ABNORMAL HIGH (ref 70–99)

## 2024-03-16 MED ORDER — LACTATED RINGERS IV SOLN: INTRAVENOUS | Status: DC

## 2024-03-16 NOTE — Plan of Care (Signed)

## 2024-03-16 NOTE — Progress Notes (Addendum)
 " PROGRESS NOTE    Charles Lloyd Juanito Mickey.  FMW:[id number] DOB: Feb 22, 1941 DOA: 02/22/2024 PCP: Theophilus Andrews, Tully GRADE, MD    Chief Complaint  Patient presents with   Fall    Brief Narrative:  84yo with h/o HTN, HLD, T2DM, afib on Eliquis , and L4-5 spondylolisthesis s/p fusion on 12/11 who presented with LLE weakness and pain with falls. He was found to have a 3.5 x 2.4 cm L psoas muscle abscess. Neurosurgery recommended IR drainage and IV antibiotics. Underwent CT-guided aspiration on 02/24/24. Developed hospital-associated delirium requiring ICU transfer, now improved. Currently on daptomycin  and cefepime  till 04/13/2024 as per ID recommendations. Psychiatry consulted on 03/06/2024 for intermittent agitation and as per family request. Palliative care also consulted for goals of care discussion.    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 - Patient seen in consultation by IR and patient underwent CT-guided aspiration of left psoas fluid collection with drain placement. - No growth noted on fluid and blood cultures. - Patient was seen in consultation by ID and on daptomycin  and cefepime  with end date scheduled for 04/13/2024 however due to AKI patient was reassessed by ID and due to concerns for cefepime  being more likely to cause AIN, and myoclonus and as such cefepime  was  discontinued and ID recommended starting patient on ciprofloxacin . -Ciprofloxacin  being changed to ertapenem  1 g IV daily per ID. -EOT 04/10/2024. -Patient noted to have elevated CK levels and as such discussed with ID and IV daptomycin  will be exchanged for IV vancomycin . -Repeat CK levels on 03/18/2024. -ID was following but signed off as of 03/13/2024  2.  Acute metabolic encephalopathy/delirium/alcohol  withdrawal syndrome with delirium/possible underlying dementia -Patient initially required Precedex  drip which has been tapered off. - Status post completion of phenobarbital  taper. - MRI brain done on 02/24/2024 negative for any acute abnormalities. - Repeat CT head done 03/10/2024 negative for any acute intracranial abnormalities. - Patient with no respiratory symptoms. - Blood cultures obtained negative. - Ammonia level within normal limits at 19. - Vitamin B12 noted at 718. - TSH within normal limits at 2.510. - Folate level noted > 20. - EEG done suggestive of generalized cerebral dysfunction likely related to toxic metabolic etiology.  No seizures or epileptiform discharges were seen throughout the recording. -It is noted that son concerned about paradoxical response to Ativan  and requesting Ativan  be avoided which has been added as an allergy. -Patient also seen in consultation by psychiatry 03/06/2024 for intermittent agitation as per family request.  Patient was started on Rozerem  per psychiatry and patient subsequently became very sleepy with intermittent tremors and poor low oral intake that was abnormal was discontinued by psychiatry. - Patient improving clinically and early on however over the past couple of days patient has been drowsy/somnolent. - Patient with some intermittent bouts of confusion.  -Gabapentin  noted to have been resumed recently and will  hold gabapentin  due to somnolence over the past 1 to 2 days. -Check an ammonia level. -It is noted per Dr.Alekh that son wants  to avoid MRI as much as possible as patient might need more sedation to get it done. - Continue treatment as in problem #1. - Palliative care following.  3.  Acute kidney injury -Patient noted to have worsening renal function and fluid likely secondary to a prerenal azotemia in the setting of low BPs and concern for AIN in the setting of cefepime . -Renal ultrasound negative for hydronephrosis. - Patient seen in consultation by urology and cefepime  noted to have been discontinued 03/10/2024. -Renal function slowly trending down currently at 1.60 today. -ANA, ANCA negative.  SPEP pending. -Elevated free kappa light chains and elevated free lambda light chains however kappa lambda ratio 1.02 - Nephrology recommending continuation of supportive care with IV fluids of LR at 100 cc/h until patient with adequate oral intake. - Nephrology was following but have signed off.   4.  Hyponatremia -Resolved.  5.  Metabolic acidosis -Likely secondary to AKI. - DC bicarb tablets. - Follow.  6.  Paroxysmal atrial fibrillation - Continue Coreg  and flecainide  for rate control. - Eliquis  for anticoagulation.  7.  Chronic diastolic heart failure/hypertension/hyperlipidemia -Currently compensated. - Continue hydralazine , Coreg . - Torsemide  resumed on 03/04/2024 but held on 03/07/2024 due to increasing creatinine/AKI. - Continue to hold torsemide  until outpatient follow-up with cardiology - Outpatient follow-up with cardiology. - Statin on hold due to transaminitis.  8.  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 - CBG noted at 86 this morning.  -Hold gabapentin  secondary to somnolence/drowsiness. - Continue SSI.  9. asthma -Stable. - Montelukast .  10.  Recurrent falls -PT/OT. - Likely needs SNF.  11.  Acute on chronic urinary retention -Patient noted to have failed voiding trial on 03/04/2024 and Foley catheter had to be placed on  03/04/2024. - Continue Flomax . -Would likely be discharged with Foley catheter in place with outpatient follow-up with urology.  12.  Hypomagnesemia -Repleted.  13.  Hypokalemia - Repleted.  14.  Transaminitis -??  Etiology c - LFTs trended up over a 72 hour period. - Acute hepatitis negative for hep A IgM, hep B surface antigen, hep B core antibody IgM.  Hep C antibody reactive.   -HCV quantitation not detected. - Right upper quadrant ultrasound done with no biliary dilatation, status postcholecystectomy, slightly echogenic liver parenchyma suggesting hepatic steatosis, small right pleural effusion.   - Patient was on a statin which has been discontinued.   - Patient on daptomycin  however noted to have elevated CK levels, discussed with ID and daptomycin  substituted for IV vancomycin ..   - Ciprofloxacin  changed to Invanz  per ID.   - GI consulted and feels likely transaminitis secondary to DILI.  - CK level ordered per GI to exclude daptomycin  as that can cause muscle injury.  -CK levels elevated and daptomycin  substituted with vancomycin  in discussion with ID. - GI recommended to continue to trend enzymes, avoid all hepatotoxins.  - Appreciate GI input and recommendations.  15.  Anemia of chronic disease - Hemoglobin stable at 8.0 - Follow H&H.   16.  Thrombocytopenia -Resolved.   17. Pressure injury, Wound 02/24/24 1245 Pressure Injury Buttocks Mid;Lower Deep Tissue Pressure Injury - Purple or maroon localized area of discolored intact skin or blood-filled blister due to damage of underlying soft tissue from pressure and/or shear. (Active)     Wound 02/24/24 1245 Pressure Injury  Scrotum Circumferential Deep Tissue Pressure Injury - Purple or maroon localized area of discolored intact skin or blood-filled blister due to damage of underlying soft tissue from pressure and/or shear. (Active)       DVT prophylaxis: Eliquis  Code Status: DNR Family Communication: No family at  bedside. Disposition: SNF when medically stable with clinical improvement.    Status is: Inpatient Remains inpatient appropriate because: Severity of illness   Consultants:  Neurosurgery: Dr. Colon 02/23/2024 Interventional radiology: Dr. Jennefer 02/23/2024 PCCM Dr. Kara 02/24/2024 ID: Dr. Overton 02/28/2024 Psychiatry: Dr. Larina 03/06/2024 Palliative care Stephane Palin, NP 03/07/2024 Nephrology: Dr. Dennise 03/10/2024 Gastroenterology: Dr. Albertus 03/13/2024  Procedures:  CT pelvis 02/22/2024 CT L-spine 02/22/2024 CT head CT C-spine 02/22/2024 CT head 03/10/2024 Chest x-ray 02/22/2024 Plain films of the pelvis 02/22/2024 MRI brain 02/24/2024 MRI L-spine 02/22/2024 Renal ultrasound 02/28/2024 CT-guided aspiration of left psoas fluid collection per IR: Dr. Philip 02/24/2024 EEG 03/11/2024 Right upper quadrant ultrasound 03/12/2024  Antimicrobials:  Anti-infectives (From admission, onward)    Start     Dose/Rate Route Frequency Ordered Stop   03/14/24 1500  vancomycin  (VANCOCIN ) IVPB 1000 mg/200 mL premix        1,000 mg 200 mL/hr over 60 Minutes Intravenous Every 24 hours 03/14/24 0935     03/13/24 1400  DAPTOmycin  (CUBICIN ) 600 mg in sodium chloride  0.9 % IVPB  Status:  Discontinued        8 mg/kg  79.3 kg 124 mL/hr over 30 Minutes Intravenous Daily 03/13/24 1034 03/14/24 0908   03/13/24 1115  ertapenem  (INVANZ ) 1 g in sodium chloride  0.9 % 100 mL IVPB        1 g 200 mL/hr over 30 Minutes Intravenous Every 24 hours 03/13/24 1034     03/11/24 1000  ciprofloxacin  (CIPRO ) IVPB 400 mg  Status:  Discontinued        400 mg 200 mL/hr over 60 Minutes Intravenous Every 24 hours 03/10/24 1456 03/13/24 1034   03/10/24 1400  DAPTOmycin  (CUBICIN ) 600 mg in sodium chloride  0.9 % IVPB  Status:  Discontinued        8 mg/kg  79.3 kg 124 mL/hr over 30 Minutes Intravenous Every 48 hours 03/09/24 1013 03/13/24 1034   03/10/24 1000  ceFEPIme  (MAXIPIME ) 2 g in sodium chloride  0.9 % 100 mL IVPB  Status:   Discontinued        2 g 200 mL/hr over 30 Minutes Intravenous Every 24 hours 03/09/24 1013 03/10/24 1456   03/07/24 1800  ceFEPIme  (MAXIPIME ) 2 g in sodium chloride  0.9 % 100 mL IVPB  Status:  Discontinued        2 g 200 mL/hr over 30 Minutes Intravenous Every 12 hours 03/07/24 1118 03/09/24 1013   02/28/24 2200  ceFEPIme  (MAXIPIME ) 2 g in sodium chloride  0.9 % 100 mL IVPB  Status:  Discontinued        2 g 200 mL/hr over 30 Minutes Intravenous Every 8 hours 02/28/24 1440 03/07/24 1118   02/28/24 1530  DAPTOmycin  (CUBICIN ) 600 mg in sodium chloride  0.9 % IVPB  Status:  Discontinued        8 mg/kg  79.3 kg 124 mL/hr over 30 Minutes Intravenous Daily 02/28/24 1440 03/09/24 1013   02/28/24 0000  daptomycin  (CUBICIN ) IVPB  Status:  Discontinued        600 mg Intravenous Every 24 hours 02/28/24 1444 03/09/24    02/28/24 0000  ceFEPime  (MAXIPIME ) IVPB  Status:  Discontinued        2  g Intravenous Every 8 hours 02/28/24 1444 03/09/24    02/23/24 2200  vancomycin  (VANCOREADY) IVPB 1500 mg/300 mL  Status:  Discontinued        1,500 mg 150 mL/hr over 120 Minutes Intravenous Every 24 hours 02/22/24 2348 02/23/24 0242   02/23/24 1000  linezolid  (ZYVOX ) IVPB 600 mg  Status:  Discontinued       Note to Pharmacy: Pt developed rash shortly after vancomycin  administration.   600 mg 300 mL/hr over 60 Minutes Intravenous Every 12 hours 02/23/24 0242 02/28/24 1440   02/23/24 0800  piperacillin -tazobactam (ZOSYN ) IVPB 3.375 g  Status:  Discontinued        3.375 g 12.5 mL/hr over 240 Minutes Intravenous Every 8 hours 02/22/24 2348 02/28/24 1440   02/22/24 2315  piperacillin -tazobactam (ZOSYN ) IVPB 3.375 g        3.375 g 100 mL/hr over 30 Minutes Intravenous  Once 02/22/24 2306 02/23/24 0013   02/22/24 2315  vancomycin  (VANCOREADY) IVPB 1500 mg/300 mL        1,500 mg 150 mL/hr over 120 Minutes Intravenous  Once 02/22/24 2306 02/23/24 0141         Subjective: Patient sleeping but arousable.  Denies any  chest pain or shortness of breath.  No abdominal pain.  Some dysarthric speech.   Objective: Vitals:   03/15/24 2043 03/15/24 2254 03/16/24 0423 03/16/24 1332  BP: (!) 122/53 116/71 (!) 143/72 115/62  Pulse: 88 88 83 71  Resp: 18  20 (!) 21  Temp: 98.6 F (37 C)  98.7 F (37.1 C) 98.8 F (37.1 C)  TempSrc: Oral  Oral Oral  SpO2: 94%  94% 95%  Weight:      Height:        Intake/Output Summary (Last 24 hours) at 03/16/2024 1334 Last data filed at 03/16/2024 1000 Gross per 24 hour  Intake 460 ml  Output 750 ml  Net -290 ml   Filed Weights   02/22/24 1238 02/24/24 1245  Weight: 79.4 kg 79.3 kg    Examination:  General exam: NAD. Respiratory system: CTAB anterior lung fields.  No wheezes, no crackles, no rhonchi.  Fair air movement.  No use of accessory muscles of respiration. Cardiovascular system: Regular rate rhythm no murmurs rubs or gallops.  No JVD.  No lower extremity edema. Gastrointestinal system: Abdomen is soft, nontender, nondistended, positive bowel sounds.  No rebound.  No guarding.  Central nervous system: Sleeping but arousable.  Some dysarthric speech...  Moving extremities spontaneously.  No focal neurological deficits. Extremities: Symmetric 5 x 5 power. Skin: No rashes, lesions or ulcers Psychiatry: Judgement and insight poor to fair. Mood & affect appropriate.     Data Reviewed: I have personally reviewed following labs and imaging studies  CBC: Recent Labs  Lab 03/10/24 0352 03/11/24 0357 03/12/24 0412 03/13/24 0312 03/14/24 0131 03/15/24 0346 03/16/24 0149  WBC 9.7 7.7 6.8 7.2 5.8 4.7 5.2  NEUTROABS 7.3 5.5 4.7  --   --   --   --   HGB 8.3* 7.7* 8.0* 8.5* 8.2* 7.8* 8.0*  HCT 25.5* 23.5* 25.1* 26.9* 25.6* 24.1* 25.0*  MCV 90.7 90.4 91.3 91.8 91.1 90.3 91.6  PLT 171 178 187 195 178 185 214    Basic Metabolic Panel: Recent Labs  Lab 03/10/24 0352 03/11/24 0357 03/12/24 0412 03/13/24 0312 03/14/24 0131 03/15/24 0346 03/16/24 0149   NA 141 144 142 141 142 141 142  K 3.4* 3.1* 3.2* 3.1* 3.4* 3.6 3.5  CL 108 111  112* 110 111 110 111  CO2 19* 19* 18* 21* 22 23 21*  GLUCOSE 113* 89 91 120* 118* 112* 104*  BUN 42* 41* 37* 32* 28* 25* 24*  CREATININE 2.60* 2.39* 2.09* 1.79* 1.60* 1.59* 1.73*  CALCIUM  8.5* 8.8* 9.1 9.0 9.0 9.0 9.0  MG 1.8 1.9 1.8  --   --   --   --     GFR: Estimated Creatinine Clearance: 32.4 mL/min (A) (by C-G formula based on SCr of 1.73 mg/dL (H)).  Liver Function Tests: Recent Labs  Lab 03/12/24 0412 03/13/24 0312 03/14/24 1210 03/15/24 0346 03/16/24 0149  AST 292* 369* 413* 441* 305*  ALT 87* 129* 169* 192* 173*  ALKPHOS 88 98 88 84 86  BILITOT 0.6 0.6 0.4 0.4 0.5  PROT 5.2* 5.3* 4.9* 4.7* 5.1*  ALBUMIN  2.6* 2.5* 2.4* 2.4* 2.4*    CBG: Recent Labs  Lab 03/15/24 1216 03/15/24 1637 03/15/24 2133 03/16/24 0736 03/16/24 1146  GLUCAP 92 88 96 86 124*     Recent Results (from the past 240 hours)  MRSA Next Gen by PCR, Nasal     Status: None   Collection Time: 03/11/24  5:49 PM   Specimen: Nasal Mucosa; Nasal Swab  Result Value Ref Range Status   MRSA by PCR Next Gen NOT DETECTED NOT DETECTED Final    Comment: (NOTE) The GeneXpert MRSA Assay (FDA approved for NASAL specimens only), is one component of a comprehensive MRSA colonization surveillance program. It is not intended to diagnose MRSA infection nor to guide or monitor treatment for MRSA infections. Test performance is not FDA approved in patients less than 26 years old. Performed at [facility], [address], [place], [place] [address]          Radiology Studies: No results found.       Scheduled Meds:  allopurinol   100 mg Oral Daily   apixaban   2.5 mg Oral BID   carvedilol   3.125 mg Oral BID   Chlorhexidine  Gluconate Cloth  6 each Topical Daily   cycloSPORINE   1 drop Both Eyes Daily   flecainide   50 mg Oral BID   folic acid   1 mg Oral Daily   gabapentin   300 mg Oral TID    hydrALAZINE   25 mg Oral BID   hydrocortisone  cream   Topical TID   insulin  aspart  0-6 Units Subcutaneous TID AC & HS   montelukast   10 mg Oral QHS   senna-docusate  1 tablet Oral QHS   sodium chloride  flush  3 mL Intravenous Q12H   tamsulosin   0.4 mg Oral QPC supper   thiamine   100 mg Oral Daily   Continuous Infusions:  ertapenem  1 g (03/16/24 1127)   lactated ringers  100 mL/hr at 03/16/24 0848   vancomycin  1,000 mg (03/15/24 1730)     LOS: 23 days    Time spent: 40 minutes    Toribio Hummer, MD [facility]   To contact the attending provider between 7A-7P or the covering provider during after hours 7P-7A, please log into the web site [email or web address] and access using universal [place] password for that web site. If you do not have the password, please call the hospital operator.  03/16/2024, 1:34 PM    "

## 2024-03-16 NOTE — Progress Notes (Signed)
 Physical Therapy Treatment Patient Details Name: Charles Lloyd. MRN: [id number] DOB: 1940/08/10 Today's Date: 03/16/2024   History of Present Illness Charles Lloyd. is a 84 y.o. male who presented to the emergency department on 02/22/2024 for leg pain and weakness, with family reporting multiple falls over the past 6 to 8 months.  MRI lumbar spine revealed a new 3.5 x 2.4 cm fluid collection in the left psoas muscle concerning for abscess.  Neurosurgery was consulted at that time and recommended IV antibiotics and possible aspiration of abscess.  with significant PMH of hypertension, hyperlipidemia, type 2 diabetes, atrial fibrillation on anticoagulation, s/p recent L4-5 trans psoas lumbar interbody fusion left-sided approach on 02/06/2024, CHF,gout,CAD, TKA with infection, neuropathy    PT Comments  Pt required +2 total assist for supine to sit, min assist for sitting balance 2* multidirectional leaning, +2 max for sit to stand with Stedy, pt unable to come to full upright standing position in [place] (pt with trunk forward flexed, head leaning on blue bar). NT notified that maximove would be safest way to transfer pt back to bed.  Decline in activity tolerance today. Pt is quite deconditioned and weak.   If plan is discharge home, recommend the following: Assist for transportation;Help with stairs or ramp for entrance;Assistance with cooking/housework;A lot of help with bathing/dressing/bathroom;Two people to help with walking and/or transfers   Can travel by private vehicle     No  Equipment Recommendations  None recommended by PT    Recommendations for Other Services       Precautions / Restrictions Precautions Precautions: Fall Recall of Precautions/Restrictions: Impaired Precaution/Restrictions Comments: trunk flexion at baseline Restrictions Weight Bearing Restrictions Per Provider Order: No     Mobility  Bed Mobility Overal bed mobility: Needs Assistance        Supine to sit: HOB elevated, +2 for physical assistance, +2 for safety/equipment, Total assist     General bed mobility comments: assist to raise trunk and pivot hips to EOB    Transfers Overall transfer level: Needs assistance Equipment used: Rolling walker (2 wheels) Transfers: Sit to/from Stand Sit to Stand: Max assist, +2 physical assistance, +2 safety/equipment           General transfer comment: very flexed posture in standing with RW, used STedy for pivot to recliner, in [place] pt was unable to bring trunk upright, he leaned his head on the blue bar, +2 max assist to bring trunk upright to place seat flaps, NT notified that maximove is better option Transfer via Lift Equipment: Stedy  Ambulation/Gait                   Stairs             Wheelchair Mobility     Tilt Bed    Modified Rankin (Stroke Patients Only)       Balance Overall balance assessment: Needs assistance, History of Falls Sitting-balance support: Feet supported Sitting balance-Leahy Scale: Poor Sitting balance - Comments: min to mod A, posterior and anterior leans   Standing balance support: Bilateral upper extremity supported, During functional activity, Reliant on assistive device for balance Standing balance-Leahy Scale: Zero Standing balance comment: unable to maintain trunk upright with +2 max assist                            Communication Communication Communication: Impaired Factors Affecting Communication: Difficulty expressing self (increased time for verbal response, not  always appropriate)  Cognition Arousal: Alert Behavior During Therapy: Flat affect   PT - Cognitive impairments: Safety/Judgement, Sequencing, Problem solving, Awareness, Memory, Attention, Initiation                       PT - Cognition Comments: pt requires increased time and manual cues to follow 1 step commands Following commands: Impaired Following commands impaired:  Follows one step commands with increased time, Only follows one step commands consistently    Cueing Cueing Techniques: Verbal cues, Gestural cues, Tactile cues, Visual cues  Exercises      General Comments        Pertinent Vitals/Pain Pain Assessment Breathing: normal Facial Expression: smiling or inexpressive Body Language: relaxed Consolability: no need to console    Home Living                          Prior Function            PT Goals (current goals can now be found in the care plan section) Acute Rehab PT Goals Patient Stated Goal: To be able to manage at home alone. PT Goal Formulation: Patient unable to participate in goal setting Time For Goal Achievement: 03/30/24 Potential to Achieve Goals: Fair Progress towards PT goals: Not progressing toward goals - comment (decreased activity tolerance)    Frequency    Min 2X/week      PT Plan      Co-evaluation              AM-PAC PT 6 Clicks Mobility   Outcome Measure  Help needed turning from your back to your side while in a flat bed without using bedrails?: Total Help needed moving from lying on your back to sitting on the side of a flat bed without using bedrails?: Total Help needed moving to and from a bed to a chair (including a wheelchair)?: Total Help needed standing up from a chair using your arms (e.g., wheelchair or bedside chair)?: Total Help needed to walk in hospital room?: Total Help needed climbing 3-5 steps with a railing? : Total 6 Click Score: 6    End of Session Equipment Utilized During Treatment: Gait belt Activity Tolerance: Patient limited by fatigue Patient left: in chair;with call bell/phone within reach;with chair alarm set;with family/visitor present Nurse Communication: Mobility status;Need for lift equipment PT Visit Diagnosis: Muscle weakness (generalized) (M62.81);Difficulty in walking, not elsewhere classified (R26.2)     Time: 8462-8441 PT Time  Calculation (min) (ACUTE ONLY): 21 min  Charges:    $Therapeutic Activity: 8-22 mins PT General Charges $$ ACUTE PT VISIT: 1 Visit                     Sylvan Delon Copp PT 03/16/2024  Acute Rehabilitation Services  Office [phone]

## 2024-03-16 NOTE — Progress Notes (Signed)
 "                                                                                                                                                                                                          Daily Progress Note   Patient Name: Charles Lloyd.       Date: 03/16/2024 DOB: Apr 03, 1940  Age: 84 y.o. MRN#: [id number] Attending Physician: Sebastian Toribio GAILS, MD Primary Care Physician: Theophilus Andrews, Tully GRADE, MD Admit Date: 02/22/2024  Reason for Consultation/Follow-up: Establishing goals of care   Length of Stay: 23  Current Medications: Scheduled Meds:   allopurinol   100 mg Oral Daily   apixaban   2.5 mg Oral BID   carvedilol   3.125 mg Oral BID   Chlorhexidine  Gluconate Cloth  6 each Topical Daily   cycloSPORINE   1 drop Both Eyes Daily   flecainide   50 mg Oral BID   folic acid   1 mg Oral Daily   gabapentin   300 mg Oral TID   hydrALAZINE   25 mg Oral BID   hydrocortisone  cream   Topical TID   insulin  aspart  0-6 Units Subcutaneous TID AC & HS   montelukast   10 mg Oral QHS   senna-docusate  1 tablet Oral QHS   sodium chloride  flush  3 mL Intravenous Q12H   tamsulosin   0.4 mg Oral QPC supper   thiamine   100 mg Oral Daily    Continuous Infusions:  ertapenem  1 g (03/15/24 1649)   lactated ringers  100 mL/hr at 03/16/24 0848   vancomycin  1,000 mg (03/15/24 1730)    PRN Meds: acetaminophen  **OR** acetaminophen , artificial tears, bisacodyl , lip balm, liver oil-zinc  oxide, mouth rinse, oxybutynin , sodium chloride  flush, sodium chloride  flush  Physical Exam Vitals reviewed.  Constitutional:      General: He is sleeping. He is not in acute distress. HENT:     Head: Normocephalic and atraumatic.  Cardiovascular:     Rate and Rhythm: Normal rate.  Pulmonary:     Effort: Pulmonary effort is normal.  Skin:    General: Skin is dry.  Neurological:     Mental Status: He is easily aroused. He is disoriented.             Vital Signs: BP (!) 143/72 (BP Location: Left  Arm)   Pulse 83   Temp 98.7 F (37.1 C) (Oral)   Resp 20   Ht 5' 8 (1.727 m)   Wt 79.3 kg   SpO2 94%   BMI 26.58 kg/m  SpO2: SpO2: 94 % O2 Device: O2  Device: Room Air O2 Flow Rate: O2 Flow Rate (L/min): 1 L/min    Patient Active Problem List   Diagnosis Date Noted   Peripheral neuropathy 03/11/2024   Stress reaction causing mixed disturbance of emotion and conduct 03/06/2024   Acute on chronic urinary retention 02/29/2024   Pressure injury 02/27/2024   Altered mental status 02/24/2024   Alcohol  withdrawal syndrome, with delirium (HCC) 02/24/2024   Delirium 02/24/2024   Psoas abscess, left (HCC) 02/22/2024   Elective surgery 02/06/2024   Bilateral leg pain 11/29/2023   Acute posthemorrhagic anemia 06/21/2022   Bilateral lower extremity edema 04/06/2022   Coronary artery disease involving native coronary artery of native heart without angina pectoris 04/05/2022   Gout 02/27/2022   Acute renal failure superimposed on stage 3b chronic kidney disease (HCC) 11/16/2021   Uremia 11/16/2021   Gout due to renal impairment 10/17/2021   Pancytopenia (HCC) 08/05/2021   SIRS (systemic inflammatory response syndrome) (HCC) 08/04/2021   Chronic kidney disease, stage 3b (HCC) 12/30/2020   Recurrent falls    Spinal stenosis    S/P TKR (total knee replacement), bilateral    Arthralgia 11/20/2019   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   Lesion of liver 04/20/2019   Secondary hypercoagulable state 04/09/2019   Eosinophil count raised 02/17/2019   Acute tracheobronchitis 01/05/2019   Asthma    Hyponatremia 12/17/2018   Vitamin D  deficiency 07/25/2018   Chronic anticoagulation 03/06/2018   Hyperlipidemia associated with type 2 diabetes mellitus (HCC) 01/28/2018   Basal cell carcinoma (BCC) of left side of nose 01/28/2018   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   Artificial knee joint present 05/20/2017   Left knee pain 05/07/2017   B12 deficiency 10/22/2016   Overweight (BMI 25.0-29.9)  10/22/2016   Primary osteoarthritis of right knee 03/29/2015   Osteoarthritis of right knee 03/29/2015   History of cholecystectomy 11/20/2014   Occult blood in stools 02/09/2014   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   Paroxysmal A-fib (HCC) 04/16/2013   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 male  with past medical history of HTN, HLD, T2DM, atrial fibrillation on Eliquis , CHF, CAD, gout, and  s/p recent L4-5 trans psoas lumbar interbody fusion left-sided approach on 02/06/2024 admitted on 02/22/2024 with leg pain, weakness, and falls. He was found to have a 3.5 x 2.4 cm L psoas muscle abscess.    Neurosurgery recommended IR drainage and antibiotics. Underwent CT-guided aspiration on 02/24/24. Developed hospital-associated delirium requiring ICU transfer, now improved.  Currently on daptomycin  and cefepime  till 04/13/2024 as per ID recommendations.  Psychiatry consulted on 03/06/2024 for intermittent agitation and as per family request.    Today's Discussion: Reviewed chart and received update from attending provider. Patient is sleeping in bed. He easily awakens but falls back to sleep quickly. He slept well last night. He is disoriented- he thinks we are in [place]. His daughter-in-law Lorn is at bedside. Discussed his elevated liver enzymes, altered mental status, and weakness during this admission.  Lorn  asks if patient can discharge to CIR. We discussed PT/OT recommendation for SNF rehab. We discussed the intensity of CIR. I shared I was worried the patient would be unable to participate at the level of CIR rehab. Mandy understands. She asked about SNF rehab options. I informed her that [facility] will help with options closer to discharge.  Emotional support and therapeutic listening provided.  Encouraged patient's family to call PMT with questions or concerns.  Goals are clear. PMT will continue to follow peripherally.  Recommendations/Plan: DNR Full code Discharge to rehab  Continued PMT support   Code Status:    Code Status Orders  (From admission, onward)           Start     Ordered   02/23/24 0010  Full code  Continuous       Question:  By:  Answer:  Consent: discussion documented in EHR   02/23/24 0011         Extensive chart review has been completed prior to seeing the patient including labs, vital signs, progress/consult notes, orders, medications, and available advance directive documents.  Care plan was discussed with Dr. Sebastian  Time spent: 25 minutes  Thank you for allowing the Palliative Medicine Team to assist in the care of this patient.   Stephane CHRISTELLA Palin, NP  Please contact Palliative Medicine Team phone at [phone] for questions and concerns.       "

## 2024-03-17 ENCOUNTER — Ambulatory Visit: Admitting: Internal Medicine

## 2024-03-17 DIAGNOSIS — R4182 Altered mental status, unspecified: Secondary | ICD-10-CM | POA: Diagnosis not present

## 2024-03-17 DIAGNOSIS — E871 Hypo-osmolality and hyponatremia: Secondary | ICD-10-CM | POA: Diagnosis not present

## 2024-03-17 DIAGNOSIS — R339 Retention of urine, unspecified: Secondary | ICD-10-CM | POA: Diagnosis not present

## 2024-03-17 DIAGNOSIS — K6812 Psoas muscle abscess: Secondary | ICD-10-CM | POA: Diagnosis not present

## 2024-03-17 LAB — COMPREHENSIVE METABOLIC PANEL WITH GFR
ALT: 162 U/L — ABNORMAL HIGH (ref 0–44)
AST: 210 U/L — ABNORMAL HIGH (ref 15–41)
Albumin: 2.6 g/dL — ABNORMAL LOW (ref 3.5–5.0)
Alkaline Phosphatase: 80 U/L (ref 38–126)
Anion gap: 8 (ref 5–15)
BUN: 22 mg/dL (ref 8–23)
CO2: 23 mmol/L (ref 22–32)
Calcium: 9.3 mg/dL (ref 8.9–10.3)
Chloride: 110 mmol/L (ref 98–111)
Creatinine, Ser: 1.57 mg/dL — ABNORMAL HIGH (ref 0.61–1.24)
GFR, Estimated: 44 mL/min — ABNORMAL LOW
Glucose, Bld: 125 mg/dL — ABNORMAL HIGH (ref 70–99)
Potassium: 3.3 mmol/L — ABNORMAL LOW (ref 3.5–5.1)
Sodium: 141 mmol/L (ref 135–145)
Total Bilirubin: 0.5 mg/dL (ref 0.0–1.2)
Total Protein: 4.7 g/dL — ABNORMAL LOW (ref 6.5–8.1)

## 2024-03-17 LAB — CBC
HCT: 24.3 % — ABNORMAL LOW (ref 39.0–52.0)
Hemoglobin: 7.6 g/dL — ABNORMAL LOW (ref 13.0–17.0)
MCH: 28.7 pg (ref 26.0–34.0)
MCHC: 31.3 g/dL (ref 30.0–36.0)
MCV: 91.7 fL (ref 80.0–100.0)
Platelets: 184 K/uL (ref 150–400)
RBC: 2.65 MIL/uL — ABNORMAL LOW (ref 4.22–5.81)
RDW: 18.6 % — ABNORMAL HIGH (ref 11.5–15.5)
WBC: 3.7 K/uL — ABNORMAL LOW (ref 4.0–10.5)
nRBC: 0 % (ref 0.0–0.2)

## 2024-03-17 LAB — GLUCOSE, CAPILLARY
Glucose-Capillary: 84 mg/dL (ref 70–99)
Glucose-Capillary: 89 mg/dL (ref 70–99)
Glucose-Capillary: 84 mg/dL (ref 70–99)
Glucose-Capillary: 86 mg/dL (ref 70–99)

## 2024-03-17 MED ORDER — POTASSIUM CHLORIDE CRYS ER 20 MEQ PO TBCR
40.0000 meq | EXTENDED_RELEASE_TABLET | Freq: Once | ORAL | Status: AC
  Administered 2024-03-17: 40 meq via ORAL
  Filled 2024-03-17: qty 2

## 2024-03-17 NOTE — Progress Notes (Signed)
 Occupational Therapy Treatment Patient Details Name: Charles Lloyd. MRN: [id number] DOB: Nov 01, 1940 Today's Date: 03/17/2024   History of present illness Charles Lloyd. is a 84 y.o. male who presented to the emergency department on 02/22/2024 for leg pain and weakness, with family reporting multiple falls over the past 6 to 8 months.  MRI lumbar spine revealed a new 3.5 x 2.4 cm fluid collection in the left psoas muscle concerning for abscess.  Neurosurgery was consulted at that time and recommended IV antibiotics and possible aspiration of abscess.  with significant PMH of hypertension, hyperlipidemia, type 2 diabetes, atrial fibrillation on anticoagulation, s/p recent L4-5 trans psoas lumbar interbody fusion left-sided approach on 02/06/2024, CHF,gout,CAD, TKA with infection, neuropathy   OT comments  Pt in bed upon therapy arrival and agreeable to participate in OT treatment session focusing on self care task of self feeding. Pt received total assist for proper positioning and set-up of lunch tray. Provided built up foam for eating utensil d/t to maximum difficulty holding utensil with his right hand. Required food to be placed on utensil in order to bring to mouth. Hand over hand assist required to hold onto drinking cup and bring to mouth to use straw. Oral care completed after meal with suction toothbrush. Patient will benefit from continued inpatient follow up therapy, <3 hours/day.       If plan is discharge home, recommend the following:  A lot of help with walking and/or transfers;A lot of help with bathing/dressing/bathroom;Help with stairs or ramp for entrance;Assistance with cooking/housework;Assist for transportation;Direct supervision/assist for medications management;Direct supervision/assist for financial management   Equipment Recommendations  Other (comment) (defer to next venue of care)       Precautions / Restrictions Precautions Precautions: Fall Precaution  Booklet Issued: No Recall of Precautions/Restrictions: Impaired Precaution/Restrictions Comments: trunk flexion at baseline Restrictions Weight Bearing Restrictions Per Provider Order: No       Mobility Bed Mobility  General bed mobility comments: Total assist and bed placed in trendelenburg to boost pt in bed.       Balance Overall balance assessment: History of Falls       ADL either performed or assessed with clinical judgement   ADL   Eating/Feeding: Moderate assistance;With adaptive utensils;Bed level Eating/Feeding Details (indicate cue type and reason): Boosted in bed. Bed in chair position. Provided built up foam for utensil. Required assist to place foot on utensil. Increased time to bring food to mouth. Hand over hand assist provided for managing drink cup and straw. Grooming: Oral care;Total assistance;Bed level Grooming Details (indicate cue type and reason): utilized suction toothbrush to complete oral care after meal                  Communication Communication Communication: Impaired Factors Affecting Communication: Difficulty expressing self;Reduced clarity of speech   Cognition Arousal: Alert Behavior During Therapy: Flat affect Cognition: Cognition impaired Difficult to assess due to: Impaired communication Orientation impairments: Time, Situation, Place Awareness: Intellectual awareness impaired, Online awareness impaired Memory impairment (select all impairments): Short-term memory, Working civil service fast streamer, Non-declarative long-term memory, Geneticist, Molecular long-term memory Attention impairment (select first level of impairment): Selective attention Executive functioning impairment (select all impairments): Problem solving, Reasoning, Sequencing, Organization, Initiation  Following commands: Impaired Following commands impaired: Follows one step commands with increased time, Only follows one step commands consistently      Cueing   Cueing Techniques: Verbal  cues             Pertinent Vitals/ Pain  Pain Assessment Pain Assessment: Faces Faces Pain Scale: Hurts a little bit Pain Location: generalized, back when boosting in bed. Pain Descriptors / Indicators: Grimacing, Discomfort Pain Intervention(s): Monitored during session, Repositioned, Limited activity within patient's tolerance         Frequency  Min 2X/week        Progress Toward Goals  OT Goals(current goals can now be found in the care plan section)  Progress towards OT goals: Progressing toward goals            AM-PAC OT 6 Clicks Daily Activity     Outcome Measure   Help from another person eating meals?: A Lot Help from another person taking care of personal grooming?: A Lot Help from another person toileting, which includes using toliet, bedpan, or urinal?: Total Help from another person bathing (including washing, rinsing, drying)?: Total Help from another person to put on and taking off regular upper body clothing?: Total Help from another person to put on and taking off regular lower body clothing?: Total 6 Click Score: 8    End of Session    OT Visit Diagnosis: Unsteadiness on feet (R26.81);Other abnormalities of gait and mobility (R26.89);Muscle weakness (generalized) (M62.81);Other symptoms and signs involving cognitive function Pain - part of body:  (generalized)   Activity Tolerance Patient tolerated treatment well;Patient limited by fatigue   Patient Left in bed;with call bell/phone within reach;with nursing/sitter in room   Nurse Communication Other (comment) (intake at lunch)        Time: 8793-8751 OT Time Calculation (min): 42 min  Charges: OT General Charges $OT Visit: 1 Visit OT Treatments $Self Care/Home Management : 38-52 mins  Leita Howell, OTR/L,CBIS  Supplemental OT - [facility] and [facility] Secure Chat Preferred    Keerthana Vanrossum, Leita BIRCH 03/17/2024, 2:08 PM

## 2024-03-17 NOTE — Progress Notes (Signed)
 Speech Language Pathology Treatment: Dysphagia  Patient Details Name: Charles Lloyd. MRN: [id number] DOB: 1940-10-07 Today's Date: 03/17/2024 Time: 1213-1230 SLP Time Calculation (min) (ACUTE ONLY): 17 min  Assessment / Plan / Recommendation Clinical Impression  Pt seen at bedside during lunch to assess tolerance of Dys3 diet/thin liquids. Pt accepted trials of thin liquid via straw, puree, and soft solid (pot roast, broccoli). Extended oral prep of solids with anterior leakage observed. SLP removed most of the bolus after prolonged ineffective mastication. Cough noted x1 during PO trials while pt chewing solid food. Pt speech very difficult to understand due to low intensity, rapid rate of speech, and generalized orofacial weakness.   Given observed difficulty masticating dys3 items, recommend downgrade to dys2/thin liquids to facilitate oral prep and clearing and maximize safe PO intake. Safe swallow precautions posted at [facility]. RN/MD/OT informed. ST will continue to follow.   HPI HPI: Charles Lloyd. is a 84 y.o. male who presented to the emergency department on 02/22/2024 for leg pain and weakness, with family reporting multiple falls over the past 6 to 8 months.  MRI lumbar spine revealed a new 3.5 x 2.4 cm fluid collection in the left psoas muscle concerning for abscess.  Neurosurgery was consulted at that time and recommended IV antibiotics and possible aspiration of abscess. Recent change in MS precipitated swallowing consult. PMH HTN, hyperlipidemia, DM2, atrial fibrillation on anticoagulation, s/p recent L4-5 trans psoas lumbar interbody fusion left-sided approach on 02/06/2024, CHF,gout,CAD, TKA with infection, neuropathy      SLP Plan  Goals updated        Swallow Evaluation Recommendations    Dys 2 diet/thin liquid, meds whole in puree or as tolerated.     Recommendations  Diet recommendations: Dysphagia 2 (fine chop);Thin liquid Liquids provided via:  Straw Medication Administration: Whole meds with puree Supervision: Staff to assist with self feeding;Full supervision/cueing for compensatory strategies Compensations: Minimize environmental distractions;Slow rate;Small sips/bites Postural Changes and/or Swallow Maneuvers: Seated upright 90 degrees;Upright 30-60 min after meal        Oral care before and after PO;Staff/trained caregiver to provide oral care   Frequent or constant Supervision/Assistance Dysphagia, unspecified (R13.10)     Goals updated    Charles Lloyd B. Charles, MSP, CCC-SLP Speech Language Pathologist Office: [phone]  Charles Lloyd 03/17/2024, 1:44 PM

## 2024-03-17 NOTE — TOC Progression Note (Addendum)
 Transition of Care (TOC) - Progression Note    Patient Details  Name: Charles Lloyd. MRN: [id number] Date of Birth: 1940-04-10  Transition of Care [facility]) CM/SW Contact  Ysabelle Goodroe, Nathanel, RN Phone Number: 03/17/2024, 12:21 PM  Clinical Narrative:  Harriet sit Kristen-no bed available-declined;will provide bed offers, await choice spoke to [employer]. Service Provider Request Status Services Address Phone Fax Patient Preferred  [facility]  Accepted -- [address] [place] [address] [phone] --  [facility]  Accepted -- [address] [place] [place] [address] [phone] --  [facility]  Accepted -- [address] [place] [address] [phone] --  [facility]  Accepted -- [address], [place] [address] [phone]         Expected Discharge Plan: Skilled Nursing Facility Barriers to Discharge: Continued Medical Work up               Expected Discharge Plan and Services In-house Referral: NA Discharge Planning Services: CM Consult Post Acute Care Choice: Skilled Nursing Facility Living arrangements for the past 2 months: Single Family Home Expected Discharge Date: 03/01/24                                     Social Drivers of Health (SDOH) Interventions SDOH Screenings   Food Insecurity: No Food Insecurity (02/23/2024)  Housing: Low Risk (02/23/2024)  Transportation Needs: No Transportation Needs (02/23/2024)  Utilities: Not At Risk (02/23/2024)  Alcohol  Screen: Low Risk (12/21/2022)  Depression (PHQ2-9): Low Risk (02/11/2024)  Financial Resource Strain: Low Risk (12/21/2022)  Physical Activity: Inactive (12/21/2022)  Social Connections: Socially Isolated (02/23/2024)  Stress: No Stress Concern Present (12/21/2022)  Tobacco Use: Low Risk (02/22/2024)  Health Literacy: Adequate Health Literacy (12/21/2022)     Readmission Risk Interventions    02/26/2024    2:28 PM 08/08/2021   10:00 AM  Readmission Risk Prevention Plan  Transportation Screening Complete Complete  PCP or Specialist Appt within 3-5 Days Complete   HRI or Home Care Consult Complete   Social Work Consult for Recovery Care Planning/Counseling Complete   Palliative Care Screening Not Applicable   Medication Review Oceanographer) Complete Complete  PCP or Specialist appointment within 3-5 days of discharge  Complete  HRI or Home Care Consult  Complete  SW Recovery Care/Counseling Consult  Not Complete  Palliative Care Screening  Not Applicable  Skilled Nursing Facility  Not Applicable

## 2024-03-17 NOTE — Progress Notes (Signed)
 " PROGRESS NOTE    Charles Lloyd Juanito Mickey.  FMW:[id number] DOB: August 04, 1940 DOA: 02/22/2024 PCP: Theophilus Andrews, Tully GRADE, MD    Chief Complaint  Patient presents with   Fall    Brief Narrative:  84yo with h/o HTN, HLD, T2DM, afib on Eliquis , and L4-5 spondylolisthesis s/p fusion on 12/11 who presented with LLE weakness and pain with falls. He was found to have a 3.5 x 2.4 cm L psoas muscle abscess. Neurosurgery recommended IR drainage and IV antibiotics. Underwent CT-guided aspiration on 02/24/24. Developed hospital-associated delirium requiring ICU transfer, now improved. Currently on Invanz  and vancomycin  till 04/10/2024 as per ID recommendations. Psychiatry consulted on 03/06/2024 for intermittent agitation and as per family request. Palliative care also consulted for goals of care discussion.    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 - Patient seen in consultation by IR and patient underwent CT-guided aspiration of left psoas fluid collection with drain placement. - No growth noted on fluid and blood cultures. - Patient was seen in consultation by ID and on daptomycin  and cefepime  with end date scheduled for 04/13/2024 however due to AKI patient was reassessed by ID and due to concerns for cefepime  being more likely to cause AIN, and myoclonus and as such cefepime  was  discontinued and ID recommended starting patient on ciprofloxacin . -Ciprofloxacin  changed to ertapenem  1 g IV daily per ID. -EOT 04/10/2024. -Patient noted to have elevated CK levels and as such discussed with ID and IV daptomycin  has been exchanged for IV vancomycin . -Repeat CK levels on 03/18/2024. -ID was following but signed off as of 03/13/2024 - ID pharmacist following peripherally.  2.  Acute metabolic encephalopathy/delirium/alcohol  withdrawal syndrome with delirium/possible underlying dementia -Patient initially required Precedex  drip which has been tapered off. - Status post completion of phenobarbital  taper. - MRI brain done on 02/24/2024 negative for any acute abnormalities. - Repeat CT head done 03/10/2024 negative for any acute intracranial abnormalities. - Patient with no respiratory symptoms. - Blood cultures obtained negative. - Ammonia level within normal limits at 19. - Vitamin B12 noted at 718. - TSH within normal limits at 2.510. - Folate level noted > 20. - EEG done suggestive of generalized cerebral dysfunction likely related to toxic metabolic etiology.  No seizures or epileptiform discharges were seen throughout the recording. -It is noted that son concerned about paradoxical response to Ativan  and requesting Ativan  be avoided which has been added as an allergy. -Patient also seen in consultation by psychiatry 03/06/2024 for intermittent agitation as per family request.  Patient was started on Rozerem  per psychiatry and patient subsequently became very sleepy with intermittent tremors and poor low oral intake that was abnormal was discontinued by psychiatry. - Patient improving clinically early on however over the past couple of days patient has been drowsy/somnolent. - Patient with some intermittent bouts of confusion.  -Gabapentin  noted to have been resumed  recently and discontinued 03/16/2024 due to somnolence noted.   - Some improvement with somnolence after  discontinuation of gabapentin . -It is noted per Dr.Alekh that son wants to avoid MRI as much as possible as patient might need more sedation to get it done. - Continue treatment as in problem #1. - Palliative care following.  3.  Acute kidney injury -Patient noted to have worsening renal function and fluid likely secondary to a prerenal azotemia in the setting of low BPs and concern for AIN in the setting of cefepime . -Renal ultrasound negative for hydronephrosis. - Patient seen in consultation by nephrology and cefepime  noted to have been discontinued 03/10/2024. -Renal function slowly trending down currently at 1.57 today. -ANA, ANCA negative.  SPEP pending. -Elevated free kappa light chains and elevated free lambda light chains however kappa lambda ratio 1.02 - Nephrology recommending continuation of supportive care with IV fluids of LR at 100 cc/h until patient with adequate oral intake. -Saline lock IV fluids - Nephrology was following but have signed off.   4.  Hyponatremia -Resolved.  5.  Metabolic acidosis -Likely secondary to AKI. - Bicarb tablets discontinued.  - Follow.  6.  Paroxysmal atrial fibrillation - Continue Coreg  and flecainide  for rate control. - Eliquis  for anticoagulation.  7.  Chronic diastolic heart failure/hypertension/hyperlipidemia -Currently compensated. - Continue hydralazine , Coreg . - Torsemide  resumed on 03/04/2024 but held on 03/07/2024 due to increasing creatinine/AKI. - Continue to hold torsemide  until outpatient follow-up with cardiology -Continue to hold statin secondary to transaminitis.   8.  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 - CBG noted at 84 this morning.  - Continue to hold gabapentin  secondary to somnolence/drowsiness. - Continue SSI.  9. asthma -Stable. - Montelukast .  10.  Recurrent falls -PT/OT. - Likely needs SNF.  11.  Acute on chronic urinary  retention -Patient noted to have failed voiding trial on 03/04/2024 and Foley catheter had to be placed on 03/04/2024. - Continue Flomax . -Would likely be discharged with Foley catheter in place with outpatient follow-up with urology.  12.  Hypomagnesemia -Repleted.  13.  Hypokalemia - Replete.  14.  Transaminitis -??  Etiology  - LFTs trended up over a 72 hour period and started to trend back down. - Acute hepatitis negative for hep A IgM, hep B surface antigen, hep B core antibody IgM.  Hep C antibody reactive.   -HCV quantitation not detected. - Right upper quadrant ultrasound done with no biliary dilatation, status postcholecystectomy, slightly echogenic liver parenchyma suggesting hepatic steatosis, small right pleural effusion.   - Patient was on a statin which has been discontinued.   - Patient on daptomycin  however noted to have elevated CK levels, discussed with ID and daptomycin  substituted for IV vancomycin ..   - Ciprofloxacin  changed to Invanz  per ID.   - GI consulted and feels likely transaminitis secondary to DILI.  - CK level ordered per GI to exclude daptomycin  as that can cause muscle injury.  -Repeat CK tomorrow.   - GI recommended to continue to trend enzymes, avoid all hepatotoxins.  - Appreciate GI input and recommendations.  15.  Anemia of chronic disease - Hemoglobin stable at 7.6. - Follow H&H.   16.  Thrombocytopenia -Resolved.   17. Pressure injury, Wound 02/24/24 1245 Pressure Injury Buttocks Mid;Lower Deep Tissue Pressure Injury - Purple or maroon localized area of discolored intact skin or blood-filled blister due to damage of underlying soft tissue from pressure and/or shear. (Active)  Wound 02/24/24 1245 Pressure Injury Scrotum Circumferential Deep Tissue Pressure Injury - Purple or maroon localized area of discolored intact skin or blood-filled blister due to damage of underlying soft tissue from pressure and/or shear. (Active)       DVT  prophylaxis: Eliquis  Code Status: DNR Family Communication: No family at bedside. Disposition: SNF when medically stable with clinical improvement and mental status back at baseline hopefully in the next 48 hours.    Status is: Inpatient Remains inpatient appropriate because: Severity of illness   Consultants:  Neurosurgery: Dr. Colon 02/23/2024 Interventional radiology: Dr. Jennefer 02/23/2024 PCCM Dr. Kara 02/24/2024 ID: Dr. Overton 02/28/2024 Psychiatry: Dr. Larina 03/06/2024 Palliative care Stephane Palin, NP 03/07/2024 Nephrology: Dr. Dennise 03/10/2024 Gastroenterology: Dr. Albertus 03/13/2024  Procedures:  CT pelvis 02/22/2024 CT L-spine 02/22/2024 CT head CT C-spine 02/22/2024 CT head 03/10/2024 Chest x-ray 02/22/2024 Plain films of the pelvis 02/22/2024 MRI brain 02/24/2024 MRI L-spine 02/22/2024 Renal ultrasound 02/28/2024 CT-guided aspiration of left psoas fluid collection per IR: Dr. Philip 02/24/2024 EEG 03/11/2024 Right upper quadrant ultrasound 03/12/2024  Antimicrobials:  Anti-infectives (From admission, onward)    Start     Dose/Rate Route Frequency Ordered Stop   03/14/24 1500  vancomycin  (VANCOCIN ) IVPB 1000 mg/200 mL premix        1,000 mg 200 mL/hr over 60 Minutes Intravenous Every 24 hours 03/14/24 0935     03/13/24 1400  DAPTOmycin  (CUBICIN ) 600 mg in sodium chloride  0.9 % IVPB  Status:  Discontinued        8 mg/kg  79.3 kg 124 mL/hr over 30 Minutes Intravenous Daily 03/13/24 1034 03/14/24 0908   03/13/24 1115  ertapenem  (INVANZ ) 1 g in sodium chloride  0.9 % 100 mL IVPB        1 g 200 mL/hr over 30 Minutes Intravenous Every 24 hours 03/13/24 1034     03/11/24 1000  ciprofloxacin  (CIPRO ) IVPB 400 mg  Status:  Discontinued        400 mg 200 mL/hr over 60 Minutes Intravenous Every 24 hours 03/10/24 1456 03/13/24 1034   03/10/24 1400  DAPTOmycin  (CUBICIN ) 600 mg in sodium chloride  0.9 % IVPB  Status:  Discontinued        8 mg/kg  79.3 kg 124 mL/hr over 30 Minutes  Intravenous Every 48 hours 03/09/24 1013 03/13/24 1034   03/10/24 1000  ceFEPIme  (MAXIPIME ) 2 g in sodium chloride  0.9 % 100 mL IVPB  Status:  Discontinued        2 g 200 mL/hr over 30 Minutes Intravenous Every 24 hours 03/09/24 1013 03/10/24 1456   03/07/24 1800  ceFEPIme  (MAXIPIME ) 2 g in sodium chloride  0.9 % 100 mL IVPB  Status:  Discontinued        2 g 200 mL/hr over 30 Minutes Intravenous Every 12 hours 03/07/24 1118 03/09/24 1013   02/28/24 2200  ceFEPIme  (MAXIPIME ) 2 g in sodium chloride  0.9 % 100 mL IVPB  Status:  Discontinued        2 g 200 mL/hr over 30 Minutes Intravenous Every 8 hours 02/28/24 1440 03/07/24 1118   02/28/24 1530  DAPTOmycin  (CUBICIN ) 600 mg in sodium chloride  0.9 % IVPB  Status:  Discontinued        8 mg/kg  79.3 kg 124 mL/hr over 30 Minutes Intravenous Daily 02/28/24 1440 03/09/24 1013   02/28/24 0000  daptomycin  (CUBICIN ) IVPB  Status:  Discontinued        600 mg Intravenous Every 24 hours 02/28/24 1444 03/09/24    02/28/24  0000  ceFEPime  (MAXIPIME ) IVPB  Status:  Discontinued        2 g Intravenous Every 8 hours 02/28/24 1444 03/09/24    02/23/24 2200  vancomycin  (VANCOREADY) IVPB 1500 mg/300 mL  Status:  Discontinued        1,500 mg 150 mL/hr over 120 Minutes Intravenous Every 24 hours 02/22/24 2348 02/23/24 0242   02/23/24 1000  linezolid  (ZYVOX ) IVPB 600 mg  Status:  Discontinued       Note to Pharmacy: Pt developed rash shortly after vancomycin  administration.   600 mg 300 mL/hr over 60 Minutes Intravenous Every 12 hours 02/23/24 0242 02/28/24 1440   02/23/24 0800  piperacillin -tazobactam (ZOSYN ) IVPB 3.375 g  Status:  Discontinued        3.375 g 12.5 mL/hr over 240 Minutes Intravenous Every 8 hours 02/22/24 2348 02/28/24 1440   02/22/24 2315  piperacillin -tazobactam (ZOSYN ) IVPB 3.375 g        3.375 g 100 mL/hr over 30 Minutes Intravenous  Once 02/22/24 2306 02/23/24 0013   02/22/24 2315  vancomycin  (VANCOREADY) IVPB 1500 mg/300 mL        1,500  mg 150 mL/hr over 120 Minutes Intravenous  Once 02/22/24 2306 02/23/24 0141         Subjective: Patient more alert today.  Denies any chest pain or shortness of breath.  No abdominal pain.  Patient with some difficulty chewing dysphagia 3 diet per RN and SLP recommending downgrading to dysphagia 2 diet.    Objective: Vitals:   03/16/24 1332 03/16/24 2123 03/17/24 0414 03/17/24 1143  BP: 115/62 (!) 133/92 (!) 141/64 115/66  Pulse: 71 80 78 73  Resp: (!) 21   17  Temp: 98.8 F (37.1 C) 97.9 F (36.6 C) 98.2 F (36.8 C) 97.7 F (36.5 C)  TempSrc: Oral Oral Oral Oral  SpO2: 95% 97% 92% 95%  Weight:      Height:        Intake/Output Summary (Last 24 hours) at 03/17/2024 1321 Last data filed at 03/17/2024 1304 Gross per 24 hour  Intake 2355.95 ml  Output 1255 ml  Net 1100.95 ml   Filed Weights   02/22/24 1238 02/24/24 1245  Weight: 79.4 kg 79.3 kg    Examination:  General exam: NAD. Respiratory system: Clear to auscultation bilaterally.  No wheezes, no crackles, no rhonchi.  Fair air movement.  Speaking in full sentences.  No use of accessory muscles of respiration.  Cardiovascular system: RRR no murmurs rubs or gallops.  No JVD.  No pitting lower extremity edema.  Gastrointestinal system: Abdomen soft, nontender, nondistended, positive bowel sounds.  No rebound.  No guarding. Central nervous system: Alert.  Moving extremities spontaneously.  No focal neurological deficits.  Extremities: Symmetric 5 x 5 power. Skin: No rashes, lesions or ulcers Psychiatry: Judgement and insight poor to fair. Mood & affect appropriate.     Data Reviewed: I have personally reviewed following labs and imaging studies  CBC: Recent Labs  Lab 03/11/24 0357 03/12/24 0412 03/13/24 0312 03/14/24 0131 03/15/24 0346 03/16/24 0149 03/17/24 0049  WBC 7.7 6.8 7.2 5.8 4.7 5.2 3.7*  NEUTROABS 5.5 4.7  --   --   --   --   --   HGB 7.7* 8.0* 8.5* 8.2* 7.8* 8.0* 7.6*  HCT 23.5* 25.1* 26.9*  25.6* 24.1* 25.0* 24.3*  MCV 90.4 91.3 91.8 91.1 90.3 91.6 91.7  PLT 178 187 195 178 185 214 184    Basic Metabolic Panel: Recent Labs  Lab 03/11/24 0357 03/12/24 0412 03/13/24 0312 03/14/24 0131 03/15/24 0346 03/16/24 0149 03/17/24 0049  NA 144 142 141 142 141 142 141  K 3.1* 3.2* 3.1* 3.4* 3.6 3.5 3.3*  CL 111 112* 110 111 110 111 110  CO2 19* 18* 21* 22 23 21* 23  GLUCOSE 89 91 120* 118* 112* 104* 125*  BUN 41* 37* 32* 28* 25* 24* 22  CREATININE 2.39* 2.09* 1.79* 1.60* 1.59* 1.73* 1.57*  CALCIUM  8.8* 9.1 9.0 9.0 9.0 9.0 9.3  MG 1.9 1.8  --   --   --   --   --     GFR: Estimated Creatinine Clearance: 35.7 mL/min (A) (by C-G formula based on SCr of 1.57 mg/dL (H)).  Liver Function Tests: Recent Labs  Lab 03/13/24 0312 03/14/24 1210 03/15/24 0346 03/16/24 0149 03/17/24 0049  AST 369* 413* 441* 305* 210*  ALT 129* 169* 192* 173* 162*  ALKPHOS 98 88 84 86 80  BILITOT 0.6 0.4 0.4 0.5 0.5  PROT 5.3* 4.9* 4.7* 5.1* 4.7*  ALBUMIN  2.5* 2.4* 2.4* 2.4* 2.6*    CBG: Recent Labs  Lab 03/16/24 1146 03/16/24 1658 03/16/24 2120 03/17/24 0724 03/17/24 1143  GLUCAP 124* 95 117* 84 89     Recent Results (from the past 240 hours)  MRSA Next Gen by PCR, Nasal     Status: None   Collection Time: 03/11/24  5:49 PM   Specimen: Nasal Mucosa; Nasal Swab  Result Value Ref Range Status   MRSA by PCR Next Gen NOT DETECTED NOT DETECTED Final    Comment: (NOTE) The GeneXpert MRSA Assay (FDA approved for NASAL specimens only), is one component of a comprehensive MRSA colonization surveillance program. It is not intended to diagnose MRSA infection nor to guide or monitor treatment for MRSA infections. Test performance is not FDA approved in patients less than 52 years old. Performed at [facility], [address] [place], [place] [address]          Radiology Studies: No results found.       Scheduled Meds:  allopurinol   100 mg Oral Daily    apixaban   2.5 mg Oral BID   carvedilol   3.125 mg Oral BID   Chlorhexidine  Gluconate Cloth  6 each Topical Daily   cycloSPORINE   1 drop Both Eyes Daily   flecainide   50 mg Oral BID   folic acid   1 mg Oral Daily   hydrALAZINE   25 mg Oral BID   hydrocortisone  cream   Topical TID   insulin  aspart  0-6 Units Subcutaneous TID AC & HS   montelukast   10 mg Oral QHS   senna-docusate  1 tablet Oral QHS   sodium chloride  flush  3 mL Intravenous Q12H   tamsulosin   0.4 mg Oral QPC supper   thiamine   100 mg Oral Daily   Continuous Infusions:  ertapenem  1 g (03/17/24 1126)   vancomycin  1,000 mg (03/16/24 1453)     LOS: 24 days    Time spent: 40 minutes    Toribio Hummer, MD [facility]   To contact the attending provider between 7A-7P or the covering provider during after hours 7P-7A, please log into the web site [email or web address] and access using universal [place] password for that web site. If you do not have the password, please call the hospital operator.  03/17/2024, 1:21 PM    "

## 2024-03-17 NOTE — Progress Notes (Signed)
 Vanc >> Dapto

## 2024-03-18 DIAGNOSIS — K6812 Psoas muscle abscess: Secondary | ICD-10-CM | POA: Diagnosis not present

## 2024-03-18 LAB — CBC WITH DIFFERENTIAL/PLATELET
Abs Immature Granulocytes: 0.01 K/uL (ref 0.00–0.07)
Basophils Absolute: 0 K/uL (ref 0.0–0.1)
Basophils Relative: 1 %
Eosinophils Absolute: 0.5 K/uL (ref 0.0–0.5)
Eosinophils Relative: 15 %
HCT: 26 % — ABNORMAL LOW (ref 39.0–52.0)
Hemoglobin: 8.1 g/dL — ABNORMAL LOW (ref 13.0–17.0)
Immature Granulocytes: 0 %
Lymphocytes Relative: 29 %
Lymphs Abs: 1.1 K/uL (ref 0.7–4.0)
MCH: 29 pg (ref 26.0–34.0)
MCHC: 31.2 g/dL (ref 30.0–36.0)
MCV: 93.2 fL (ref 80.0–100.0)
Monocytes Absolute: 0.4 K/uL (ref 0.1–1.0)
Monocytes Relative: 12 %
Neutro Abs: 1.6 K/uL — ABNORMAL LOW (ref 1.7–7.7)
Neutrophils Relative %: 43 %
Platelets: 208 K/uL (ref 150–400)
RBC: 2.79 MIL/uL — ABNORMAL LOW (ref 4.22–5.81)
RDW: 18.4 % — ABNORMAL HIGH (ref 11.5–15.5)
WBC: 3.7 K/uL — ABNORMAL LOW (ref 4.0–10.5)
nRBC: 0 % (ref 0.0–0.2)

## 2024-03-18 LAB — COMPREHENSIVE METABOLIC PANEL WITH GFR
ALT: 147 U/L — ABNORMAL HIGH (ref 0–44)
AST: 147 U/L — ABNORMAL HIGH (ref 15–41)
Albumin: 2.5 g/dL — ABNORMAL LOW (ref 3.5–5.0)
Alkaline Phosphatase: 101 U/L (ref 38–126)
Anion gap: 10 (ref 5–15)
BUN: 21 mg/dL (ref 8–23)
CO2: 22 mmol/L (ref 22–32)
Calcium: 9.4 mg/dL (ref 8.9–10.3)
Chloride: 113 mmol/L — ABNORMAL HIGH (ref 98–111)
Creatinine, Ser: 1.44 mg/dL — ABNORMAL HIGH (ref 0.61–1.24)
GFR, Estimated: 49 mL/min — ABNORMAL LOW
Glucose, Bld: 96 mg/dL (ref 70–99)
Potassium: 3.7 mmol/L (ref 3.5–5.1)
Sodium: 145 mmol/L (ref 135–145)
Total Bilirubin: 0.5 mg/dL (ref 0.0–1.2)
Total Protein: 5 g/dL — ABNORMAL LOW (ref 6.5–8.1)

## 2024-03-18 LAB — GLUCOSE, CAPILLARY
Glucose-Capillary: 69 mg/dL — ABNORMAL LOW (ref 70–99)
Glucose-Capillary: 78 mg/dL (ref 70–99)
Glucose-Capillary: 92 mg/dL (ref 70–99)
Glucose-Capillary: 83 mg/dL (ref 70–99)
Glucose-Capillary: 75 mg/dL (ref 70–99)

## 2024-03-18 LAB — CK: Total CK: 209 U/L (ref 49–397)

## 2024-03-18 MED ORDER — SODIUM CHLORIDE 0.9 % IV SOLN
8.0000 mg/kg | Freq: Every day | INTRAVENOUS | Status: DC
  Administered 2024-03-19 – 2024-03-22 (×4): 600 mg via INTRAVENOUS
  Filled 2024-03-18 (×5): qty 12

## 2024-03-18 MED ORDER — DAPTOMYCIN-SODIUM CHLORIDE 500-0.9 MG/50ML-% IV SOLN
500.0000 mg | Freq: Once | INTRAVENOUS | Status: AC
  Administered 2024-03-18: 500 mg via INTRAVENOUS
  Filled 2024-03-18: qty 50

## 2024-03-18 NOTE — Progress Notes (Signed)
 Speech Language Pathology Treatment: Dysphagia  Patient Details Name: Charles Lloyd. MRN: [id number] DOB: 1941-02-11 Today's Date: 03/18/2024 Time: [id number] SLP Time Calculation (min) (ACUTE ONLY): 14 min  Assessment / Plan / Recommendation Clinical Impression  PLAN: Continue dys2/thin liquids with 1:1 assist with all PO intake. Follow posted safe swallow precautions. Minimize distractions during PO intake.  Pt seen at bedside to assess tolerance of dys 2/thin liquid diet after downgrade 03/17/24. Pt was seated upright in bed, awake and alert. Speech unintelligible but voice quality clear, difficulty following commands. Pt accepted trials of thin liquid, puree, and soft solid textures. Discoordinated oral prep and left anterior leakage noted. Pt exhibited intermittent difficulty drinking thin liquid via straw. Oral cavity clear after puree/solid trials. No overt s/s aspiration observed. Safe swallow precautions posted at [facility]. RN reports pt tolerating diet well. Will continue on dys2/thin at this time. SLP will follow to assess readiness to advance textures and continue education.    HPI HPI: Charles Lloyd. is a 84 y.o. male who presented to the emergency department on 02/22/2024 for leg pain and weakness, with family reporting multiple falls over the past 6 to 8 months.  MRI lumbar spine revealed a new 3.5 x 2.4 cm fluid collection in the left psoas muscle concerning for abscess.  Neurosurgery was consulted at that time and recommended IV antibiotics and possible aspiration of abscess. BSE 03/10/24: NPO, Dys1/thin 03/11/24, Dys3/thin 03/13/24. Dys2/thin 03/18/24. PMH HTN, hyperlipidemia, DM2, atrial fibrillation on anticoagulation, s/p recent L4-5 trans psoas lumbar interbody fusion left-sided approach on 02/06/2024, CHF,gout,CAD, TKA with infection, neuropathy      SLP Plan  Continue with current plan of care        Swallow Evaluation Recommendations    Continue Dys2/thin, 1:1 assist  with all PO intake     Recommendations  Diet recommendations: Dysphagia 2 (fine chop);Thin liquid Liquids provided via: Straw Medication Administration: Whole meds with puree (one at a time) Supervision: Staff to assist with self feeding;Full supervision/cueing for compensatory strategies;Trained caregiver to feed patient Compensations: Minimize environmental distractions;Slow rate;Small sips/bites Postural Changes and/or Swallow Maneuvers: Seated upright 90 degrees;Upright 30-60 min after meal        Oral care before and after PO;Staff/trained caregiver to provide oral care   Frequent or constant Supervision/Assistance Dysphagia, unspecified (R13.10)     Continue with current plan of care    Charles Lloyd, MSP, CCC-SLP Speech Language Pathologist  Charles Lloyd 03/18/2024, 12:43 PM

## 2024-03-18 NOTE — Plan of Care (Signed)
" °  Problem: Education: Goal: Ability to describe self-care measures that may prevent or decrease complications (Diabetes Survival Skills Education) will improve Outcome: Progressing Goal: Individualized Educational Video(s) Outcome: Progressing   Problem: Skin Integrity: Goal: Risk for impaired skin integrity will decrease Outcome: Progressing   Problem: Clinical Measurements: Goal: Ability to maintain clinical measurements within normal limits will improve Outcome: Progressing Goal: Will remain free from infection Outcome: Progressing Goal: Diagnostic test results will improve Outcome: Progressing Goal: Respiratory complications will improve Outcome: Progressing Goal: Cardiovascular complication will be avoided Outcome: Progressing   "

## 2024-03-18 NOTE — Plan of Care (Signed)
" °  Problem: Clinical Measurements: Goal: Ability to maintain clinical measurements within normal limits will improve Outcome: Progressing Goal: Will remain free from infection Outcome: Progressing   Problem: Elimination: Goal: Will not experience complications related to bowel motility Outcome: Progressing   Problem: Pain Managment: Goal: General experience of comfort will improve and/or be controlled Outcome: Progressing   Problem: Safety: Goal: Ability to remain free from injury will improve Outcome: Progressing   Problem: Activity: Goal: Risk for activity intolerance will decrease Outcome: Not Progressing   Problem: Nutrition: Goal: Adequate nutrition will be maintained Outcome: Not Progressing   "

## 2024-03-18 NOTE — Progress Notes (Signed)
 " PROGRESS NOTE    Charles Lloyd Charles Lloyd.  FMW:[id number] DOB: 1940-08-10 DOA: 02/22/2024 PCP: Theophilus Andrews, Tully GRADE, MD   Brief Narrative:  [phone] with h/o HTN, HLD, T2DM, afib on Eliquis , and L4-5 spondylolisthesis s/p fusion on 12/11 who presented with LLE weakness and pain with falls. He was found to have a 3.5 x 2.4 cm L psoas muscle abscess. Neurosurgery recommended IR drainage and IV antibiotics. Underwent CT-guided aspiration on 02/24/24. Developed hospital-associated delirium requiring ICU transfer, now improved. Currently on antibiotics through 04/10/2024 as per ID recommendations. Psychiatry consulted on 03/06/2024 for intermittent agitation and as per family request. Palliative care also consulted for goals of care discussion.  Assessment & Plan:   Principal Problem:   Psoas abscess, left (HCC) Active Problems:   Paroxysmal A-fib (HCC)   Chronic heart failure with preserved ejection fraction (HFpEF) (HCC)   NIDDM-2 with polyneuropathy and hyperglycemia   Hypercholesterolemia   Hyponatremia   Asthma   Essential hypertension   Recurrent falls   Altered mental status   Alcohol  withdrawal syndrome, with delirium (HCC)   Delirium   Pressure injury   Acute on chronic urinary retention   Stress reaction causing mixed disturbance of emotion and conduct   Peripheral neuropathy  Left psoas abscess, POA Sepsis POA in the setting of tachycardia fever and infection - Patient noted to have had recent transverse lumbar interbody fusion on 02/06/2024. - Patient seen in consultation by IR and patient underwent CT-guided aspiration of left psoas fluid collection with drain placement. - Cultures remain negative - Infectious disease following, transition back transition back to daptomycin  given resolution of CK, add ertapenem  through 04/10/2024    Acute metabolic encephalopathy/delirium/alcohol  withdrawal syndrome with delirium/possible underlying dementia Cannot rule out polypharmacy -  Patient baseline sharp as attack per son - Initial agitation likely in the setting of infection - Subsequent medications including benzodiazepine, Precedex , phenobarbital  with no real improvement in patient's mental status and ongoing agitation -given age polypharmacy is of concern - Subsequent renal dysfunction and liver dysfunction in the setting of multiple medications and infection likely contributing as well - MRI 02/24/2024 negative, repeat CT head on 03/10/2024 negative for acute process - Labs wholly unremarkable including ammonia, B12, TSH,, TSH, - EEG unrevealing -consistent with metabolic/toxic etiology - Psychiatry consulted per family request 03/06/2024 -initiated on Rozerem  with subsequent somnolence somnolence somnolence and stupor with questionable tremors and poor p.o. intake -subsequently discontinued - Long longstanding gabapentin  also discontinued in this timeframe - Continue to follow patient clinically given ongoing improvement, although mild, over the past few days  Acute kidney injury, resolving Hypovolemic hyponatremia, resolved Metabolic acidosis - Likely multifactorial in setting of poor p.o. 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 intake and IV fluids - ANA, ANCA -within normal limits, kappa lambda light chains noted to be mildly elevated but ratio within normal limits - Nephrology was following but have signed off.  - Bicarb tablets discontinued.   Elevated LFTs, resolving -Likely multifactorial similar to a late AKI as above in the setting of poor p.o. intake, hypovolemia, hypotension and medication injury - Hepatitis panel negative other than reactive hepatitis C antibody(false positive given subsequent confirmatory tests negative) - Imaging unremarkable other than other than hepatic steatosis and slightly echogenic liver, nonspecific - Statin discontinued -  Restart daptomycin  given discussion with ID, vancomycin  discontinued - GI previously consulted with concern for drug-induced liver injury    Paroxysmal atrial fibrillation -  Continue Coreg  and flecainide  for rate control. - Eliquis  for anticoagulation.  Chronic diastolic heart failure/hypertension/hyperlipidemia - Continue hydralazine , Coreg . 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  - Continue to hold gabapentin  secondary to somnolence/drowsiness. - Continue SSI, hypoglycemic protocol   Asthma -Stable, not in acute exacerbation. - Montelukast .   Recurrent falls -PT/OT. - Tentative plan for discharge to SNF   Acute on chronic urinary retention -Patient noted to have failed voiding trial on 03/04/2024 and Foley catheter had to be placed on 03/04/2024. -Continue Flomax . - Plan to discharge with Foley catheter in place with outpatient follow-up with urology.   Anemia of chronic disease - Hemoglobin stable   Pressure injury, POA Wound 02/24/24 1245 Pressure Injury Buttocks Mid;Lower Deep Tissue Pressure Injury - Purple or maroon localized area of discolored intact skin or blood-filled blister due to damage of underlying soft tissue from pressure and/or shear. (Active)     Wound 02/24/24 1245 Pressure Injury Scrotum Circumferential Deep Tissue Pressure Injury - Purple or maroon localized area of discolored intact skin or blood-filled blister due to damage of underlying soft tissue from pressure and/or shear. (Active)      DVT prophylaxis: apixaban  (ELIQUIS ) tablet 2.5 mg Start: 03/09/24 2200 SCDs Start: 02/23/24 0010 apixaban  (ELIQUIS ) tablet 2.5 mg   Code Status:   Code Status: Do  not attempt resuscitation (DNR) PRE-ARREST INTERVENTIONS DESIRED  Family Communication: Son updated over the phone  Status is: Inpatient  Dispo: The patient is from: Home              Anticipated d/c is to: Facility              Anticipated d/c date is: 24-48              Patient currently hours not medically stable for discharge  Consultants:  Nephrology, GI, infectious disease, psychiatry  Antimicrobials:  Continue above daptomycin  and ertapenem  through 04/10/2024 per ID  Subjective: No acute issues or events overnight patient's review of systems is somewhat limited given difficulty with speech shakes his head no to pain  Objective: Vitals:   03/17/24 1143 03/17/24 1838 03/17/24 2222 03/18/24 0529  BP: 115/66 137/68 (!) 144/64 (!) 147/66  Pulse: 73 72 80 73  Resp: 17 16 18 18   Temp: 97.7 F (36.5 C) (!) 97.5 F (36.4 C) (!) 97.4 F (36.3 C) (!) 97.4 F (36.3 C)  TempSrc: Oral Oral Oral Oral  SpO2: 95% 96% 96% 94%  Weight:      Height:        Intake/Output Summary (Last 24 hours) at 03/18/2024 0731 Last data filed at 03/18/2024 0536 Gross per 24 hour  Intake 260 ml  Output 900 ml  Net -640 ml   Filed Weights   02/22/24 1238 02/24/24 1245  Weight: 79.4 kg 79.3 kg    Examination:  General:  Pleasantly resting in bed, No acute distress.  Notable stutter and word finding difficulty HEENT:  Normocephalic atraumatic.  Sclerae nonicteric, noninjected.  Extraocular movements intact bilaterally. Neck:  Without mass or deformity.  Trachea is midline. Lungs:  Clear to auscultate bilaterally without rhonchi, wheeze, or rales. Heart:  Regular rate and rhythm.  Without murmurs, rubs, or gallops. Abdomen:  Soft, nontender,  nondistended.  Without guarding or rebound. Extremities: Without cyanosis, or edema   Data Reviewed: I have personally reviewed following labs and imaging studies  CBC: Recent Labs  Lab 03/12/24 0412 03/13/24 0312 03/14/24 0131 03/15/24 0346  03/16/24 0149 03/17/24 0049  WBC 6.8 7.2 5.8 4.7 5.2 3.7*  NEUTROABS 4.7  --   --   --   --   --   HGB 8.0* 8.5* 8.2* 7.8* 8.0* 7.6*  HCT 25.1* 26.9* 25.6* 24.1* 25.0* 24.3*  MCV 91.3 91.8 91.1 90.3 91.6 91.7  PLT 187 195 178 185 214 184   Basic Metabolic Panel: Recent Labs  Lab 03/12/24 0412 03/13/24 0312 03/14/24 0131 03/15/24 0346 03/16/24 0149 03/17/24 0049  NA 142 141 142 141 142 141  K 3.2* 3.1* 3.4* 3.6 3.5 3.3*  CL 112* 110 111 110 111 110  CO2 18* 21* 22 23 21* 23  GLUCOSE 91 120* 118* 112* 104* 125*  BUN 37* 32* 28* 25* 24* 22  CREATININE 2.09* 1.79* 1.60* 1.59* 1.73* 1.57*  CALCIUM  9.1 9.0 9.0 9.0 9.0 9.3  MG 1.8  --   --   --   --   --    GFR: Estimated Creatinine Clearance: 35.7 mL/min (A) (by C-G formula based on SCr of 1.57 mg/dL (H)). Liver Function Tests: Recent Labs  Lab 03/13/24 0312 03/14/24 1210 03/15/24 0346 03/16/24 0149 03/17/24 0049  AST 369* 413* 441* 305* 210*  ALT 129* 169* 192* 173* 162*  ALKPHOS 98 88 84 86 80  BILITOT 0.6 0.4 0.4 0.5 0.5  PROT 5.3* 4.9* 4.7* 5.1* 4.7*  ALBUMIN  2.5* 2.4* 2.4* 2.4* 2.6*   No results for input(s): LIPASE, AMYLASE in the last 168 hours. Recent Labs  Lab 03/16/24 2104  AMMONIA 19   Coagulation Profile: No results for input(s): INR, PROTIME in the last 168 hours. Cardiac Enzymes: Recent Labs  Lab 03/14/24 0131 03/16/24 0149  CKTOTAL 3,694* 1,065*   CBG: Recent Labs  Lab 03/16/24 2120 03/17/24 0724 03/17/24 1143 03/17/24 1601 03/17/24 2225  GLUCAP 117* 84 89 84 86   Recent Results (from the past 240 hours)  MRSA Next Gen by PCR, Nasal     Status: None   Collection Time: 03/11/24  5:49 PM   Specimen: Nasal Mucosa; Nasal Swab  Result Value Ref Range Status   MRSA by PCR Next Gen NOT DETECTED NOT DETECTED Final    Comment: (NOTE) The GeneXpert MRSA Assay (FDA approved for NASAL specimens only), is one component of a comprehensive MRSA colonization surveillance program. It is  not intended to diagnose MRSA infection nor to guide or monitor treatment for MRSA infections. Test performance is not FDA approved in patients less than 33 years old. Performed at [facility], [address] [place], [place] [address]     Radiology Studies: No results found.  Scheduled Meds:  allopurinol   100 mg Oral Daily   apixaban   2.5 mg Oral BID   carvedilol   3.125 mg Oral BID   Chlorhexidine  Gluconate Cloth  6 each Topical Daily   cycloSPORINE   1 drop Both Eyes Daily   flecainide   50 mg Oral BID   folic acid   1 mg Oral Daily   hydrALAZINE   25 mg Oral BID   hydrocortisone  cream   Topical TID   insulin  aspart  0-6 Units Subcutaneous TID AC & HS   montelukast   10 mg Oral QHS   senna-docusate  1 tablet Oral QHS   sodium chloride   flush  3 mL Intravenous Q12H   tamsulosin   0.4 mg Oral QPC supper   thiamine   100 mg Oral Daily   Continuous Infusions:  ertapenem  1 g (03/17/24 1126)   vancomycin  1,000 mg (03/17/24 1607)     LOS: 25 days   Time spent:  Charles Lloyd Montclair, DO [facility]  If 7PM-7AM, please contact night-coverage [email or web address]  03/18/2024, 7:31 AM      "

## 2024-03-19 ENCOUNTER — Inpatient Hospital Stay (HOSPITAL_COMMUNITY)

## 2024-03-19 DIAGNOSIS — K6812 Psoas muscle abscess: Secondary | ICD-10-CM | POA: Diagnosis not present

## 2024-03-19 LAB — GLUCOSE, CAPILLARY
Glucose-Capillary: 62 mg/dL — ABNORMAL LOW (ref 70–99)
Glucose-Capillary: 103 mg/dL — ABNORMAL HIGH (ref 70–99)
Glucose-Capillary: 70 mg/dL (ref 70–99)
Glucose-Capillary: 92 mg/dL (ref 70–99)
Glucose-Capillary: 71 mg/dL (ref 70–99)
Glucose-Capillary: 81 mg/dL (ref 70–99)

## 2024-03-19 MED ORDER — ALLOPURINOL 300 MG PO TABS: 300.0000 mg | ORAL_TABLET | Freq: Every day | ORAL | Status: DC

## 2024-03-19 MED ORDER — DEXTROSE 50 % IV SOLN
INTRAVENOUS | Status: AC
  Administered 2024-03-19: 50 mL
  Filled 2024-03-19: qty 50

## 2024-03-19 MED ORDER — FLECAINIDE ACETATE 100 MG PO TABS
100.0000 mg | ORAL_TABLET | Freq: Two times a day (BID) | ORAL | Status: DC
  Administered 2024-03-19: 100 mg via ORAL
  Filled 2024-03-19 (×2): qty 1

## 2024-03-19 MED ORDER — APIXABAN 5 MG PO TABS
5.0000 mg | ORAL_TABLET | Freq: Two times a day (BID) | ORAL | Status: DC
  Administered 2024-03-19: 5 mg via ORAL
  Filled 2024-03-19: qty 1

## 2024-03-19 NOTE — Progress Notes (Signed)
 " PROGRESS NOTE    Charles Lloyd.  FMW:[id number] DOB: April 07, 1940 DOA: 02/22/2024 PCP: Theophilus Andrews, Tully GRADE, MD   Brief Narrative:  [phone] with h/o HTN, HLD, T2DM, afib on Eliquis , and L4-5 spondylolisthesis s/p fusion on 12/11 who presented with LLE weakness and pain with falls. He was found to have a 3.5 x 2.4 cm L psoas muscle abscess. Neurosurgery recommended IR drainage and IV antibiotics. Underwent CT-guided aspiration on 02/24/24. Developed hospital-associated delirium requiring ICU transfer, now improved. Currently on antibiotics through 04/10/2024 as per ID recommendations. Psychiatry consulted on 03/06/2024 for intermittent agitation and as per family request. Palliative care also consulted for goals of care discussion.  Assessment & Plan:   Principal Problem:   Psoas abscess, left (HCC) Active Problems:   Paroxysmal A-fib (HCC)   Chronic heart failure with preserved ejection fraction (HFpEF) (HCC)   NIDDM-2 with polyneuropathy and hyperglycemia   Hypercholesterolemia   Hyponatremia   Asthma   Essential hypertension   Recurrent falls   Altered mental status   Alcohol  withdrawal syndrome, with delirium (HCC)   Delirium   Pressure injury   Acute on chronic urinary retention   Stress reaction causing mixed disturbance of emotion and conduct   Peripheral neuropathy   Left psoas abscess, POA Sepsis POA in the setting of tachycardia fever and infection - Patient noted to have had recent transverse lumbar interbody fusion on 02/06/2024. - Patient seen in consultation by IR and patient underwent CT-guided aspiration of left psoas fluid collection with drain placement. - Cultures remain negative - Infectious disease following, transition back transition back to daptomycin  given resolution of CK, add ertapenem  through 04/10/2024    Acute metabolic encephalopathy/delirium/alcohol  withdrawal syndrome with delirium/possible underlying dementia Cannot rule out polypharmacy -  Patient baseline sharp as attack per son - Initial agitation likely in the setting of infection - Subsequent medications including benzodiazepine, Precedex , phenobarbital  with no real improvement in patient's mental status and ongoing agitation -given age polypharmacy is of concern - Subsequent renal dysfunction and liver dysfunction in the setting of multiple medications and infection likely contributing as well - MRI 02/24/2024 negative, repeat CT head on 03/10/2024 negative for acute process - Labs wholly unremarkable including ammonia, B12, TSH,, TSH, - EEG unrevealing -consistent with metabolic/toxic etiology - Psychiatry consulted per family request 03/06/2024 -initiated on Rozerem  with subsequent somnolence somnolence somnolence and stupor with questionable tremors and poor p.o. intake -subsequently discontinued - Long longstanding gabapentin  also discontinued in this timeframe - Continue to follow patient clinically given ongoing improvement, although mild, over the past few days -continue advance diet as tolerated  Acute kidney injury, resolving Hypovolemic hyponatremia, resolved Metabolic acidosis - Likely multifactorial in setting of poor p.o. 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 intake and IV fluids - ANA, ANCA -within normal limits, kappa lambda light chains noted to be mildly elevated but ratio within normal limits - Nephrology was following but have signed off.  - Bicarb tablets discontinued.   Elevated LFTs, resolving -Likely multifactorial similar to a late AKI as above in the setting of poor p.o. intake, hypovolemia, hypotension and medication injury - Hepatitis panel negative other than reactive hepatitis C antibody(false positive given subsequent confirmatory tests negative) - Imaging unremarkable other than other than hepatic steatosis and slightly echogenic liver,  nonspecific - Statin discontinued - Restart daptomycin  given discussion with ID, vancomycin  discontinued - GI previously consulted with concern for drug-induced liver injury  Paroxysmal atrial fibrillation - Continue Coreg  and flecainide  for rate control. - Eliquis  for anticoagulation.  Chronic diastolic heart failure/hypertension/hyperlipidemia - Continue hydralazine , Coreg . 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  - Continue to hold gabapentin  secondary to somnolence/drowsiness. - Continue SSI, hypoglycemic protocol   Asthma -Stable, not in acute exacerbation. - Montelukast .   Recurrent falls -PT/OT. - Tentative plan for discharge to SNF   Acute on chronic urinary retention -Patient noted to have failed voiding trial on 03/04/2024 and Foley catheter had to be placed on 03/04/2024. -Continue Flomax . - Plan to discharge with Foley catheter in place with outpatient follow-up with urology.   Anemia of chronic disease - Hemoglobin stable   Pressure injury, POA Wound 02/24/24 1245 Pressure Injury Buttocks Mid;Lower Deep Tissue Pressure Injury - Purple or maroon localized area of discolored intact skin or blood-filled blister due to damage of underlying soft tissue from pressure and/or shear. (Active)     Wound 02/24/24 1245 Pressure Injury Scrotum Circumferential Deep Tissue Pressure Injury - Purple or maroon localized area of discolored intact skin or blood-filled blister due to damage of underlying soft tissue from pressure and/or shear. (Active)      DVT prophylaxis: apixaban  (ELIQUIS ) tablet 2.5 mg Start: 03/09/24 2200 SCDs Start: 02/23/24 0010 apixaban  (ELIQUIS ) tablet 2.5 mg    Code Status:   Code Status: Do not attempt resuscitation (DNR) PRE-ARREST INTERVENTIONS DESIRED  Family Communication: Son updated over the phone  Status is: Inpatient  Dispo: The patient is from: Home              Anticipated d/c is to: Facility              Anticipated d/c date is: 24-48              Patient currently hours not medically stable for discharge  Consultants:  Nephrology, GI, infectious disease, psychiatry  Antimicrobials:  Continue above daptomycin  and ertapenem  through 04/10/2024 per ID  Subjective: No acute issues or events overnight patient's review of systems is somewhat limited given difficulty with speech shakes his head no to pain  Objective: Vitals:   03/18/24 1348 03/18/24 1955 03/19/24 0526 03/19/24 0540  BP:  127/76 (!) 141/57   Pulse: 76 80 85   Resp: 20 15 19    Temp: 97.8 F (36.6 C) (!) 97.4 F (36.3 C) 98.7 F (37.1 C)   TempSrc: Oral Oral    SpO2: 96% 93% 90%   Weight:    79.3 kg  Height:        Intake/Output Summary (Last 24 hours) at 03/19/2024 0711 Last data filed at 03/19/2024 0540 Gross per 24 hour  Intake 190 ml  Output 600 ml  Net -410 ml   Filed Weights   02/22/24 1238 02/24/24 1245 03/19/24 0540  Weight: 79.4 kg 79.3 kg 79.3 kg    Examination:  General:  Pleasantly resting in bed, No acute distress.  Notable stutter and word finding difficulty HEENT:  Normocephalic atraumatic.  Sclerae nonicteric, noninjected.  Extraocular movements intact bilaterally. Neck:  Without mass or deformity.  Trachea is midline. Lungs:  Clear to auscultate bilaterally without rhonchi, wheeze, or rales. Heart:  Regular rate and rhythm.  Without murmurs, rubs, or gallops. Abdomen:  Soft, nontender, nondistended.  Without guarding or rebound. Extremities: Without cyanosis, or edema   Data Reviewed: I have personally reviewed following labs and imaging studies  CBC: Recent Labs  Lab 03/14/24 0131 03/15/24 0346 03/16/24 0149  03/17/24 0049 03/18/24 0940  WBC 5.8 4.7 5.2 3.7* 3.7*  NEUTROABS  --   --   --   --  1.6*  HGB 8.2* 7.8* 8.0* 7.6* 8.1*  HCT 25.6* 24.1* 25.0* 24.3* 26.0*  MCV 91.1 90.3 91.6 91.7 93.2  PLT 178 185 214 184 208   Basic Metabolic Panel: Recent Labs  Lab 03/14/24 0131 03/15/24 0346 03/16/24 0149 03/17/24 0049 03/18/24 0940  NA 142 141 142 141 145  K 3.4* 3.6 3.5 3.3* 3.7  CL 111 110 111 110 113*  CO2 22 23 21* 23 22  GLUCOSE 118* 112* 104* 125* 96  BUN 28* 25* 24* 22 21  CREATININE 1.60* 1.59* 1.73* 1.57* 1.44*  CALCIUM  9.0 9.0 9.0 9.3 9.4   GFR: Estimated Creatinine Clearance: 38.9 mL/min (A) (by C-G formula based on SCr of 1.44 mg/dL (H)). Liver Function Tests: Recent Labs  Lab 03/14/24 1210 03/15/24 0346 03/16/24 0149 03/17/24 0049 03/18/24 0940  AST 413* 441* 305* 210* 147*  ALT 169* 192* 173* 162* 147*  ALKPHOS 88 84 86 80 101  BILITOT 0.4 0.4 0.5 0.5 0.5  PROT 4.9* 4.7* 5.1* 4.7* 5.0*  ALBUMIN  2.4* 2.4* 2.4* 2.6* 2.5*   No results for input(s): LIPASE, AMYLASE in the last 168 hours. Recent Labs  Lab 03/16/24 2104  AMMONIA 19   Coagulation Profile: No results for input(s): INR, PROTIME in the last 168 hours. Cardiac Enzymes: Recent Labs  Lab 03/14/24 0131 03/16/24 0149 03/18/24 0940  CKTOTAL 3,694* 1,065* 209   CBG: Recent Labs  Lab 03/18/24 0732 03/18/24 0808 03/18/24 1131 03/18/24 1600 03/18/24 2205  GLUCAP 69* 78 92 83 75   Recent Results (from the past 240 hours)  MRSA Next Gen by PCR, Nasal     Status: None   Collection Time: 03/11/24  5:49 PM   Specimen: Nasal Mucosa; Nasal Swab  Result Value Ref Range Status   MRSA by PCR Next Gen NOT DETECTED NOT DETECTED Final    Comment: (NOTE) The GeneXpert MRSA Assay (FDA approved for NASAL specimens only), is one component of a comprehensive MRSA colonization surveillance program. It is not intended to diagnose MRSA infection nor to guide or monitor treatment for MRSA  infections. Test performance is not FDA approved in patients less than 66 years old. Performed at [facility], [address], [place], [place] [address]     Radiology Studies: No results found.  Scheduled Meds:  allopurinol   100 mg Oral Daily   apixaban   2.5 mg Oral BID   carvedilol   3.125 mg Oral BID   Chlorhexidine  Gluconate Cloth  6 each Topical Daily   cycloSPORINE   1 drop Both Eyes Daily   flecainide   50 mg Oral BID   folic acid   1 mg Oral Daily   hydrALAZINE   25 mg Oral BID   hydrocortisone  cream   Topical TID   insulin  aspart  0-6 Units Subcutaneous TID AC & HS   montelukast   10 mg Oral QHS   senna-docusate  1 tablet Oral QHS   sodium chloride  flush  3 mL Intravenous Q12H   tamsulosin   0.4 mg Oral QPC supper   thiamine   100 mg Oral Daily   Continuous Infusions:  DAPTOmycin   ertapenem  Stopped (03/18/24 2314)     LOS: 26 days   Time spent:  Elsie JAYSON Montclair, DO [facility]  If 7PM-7AM, please contact night-coverage [email or web address]  03/19/2024, 7:11 AM      "

## 2024-03-19 NOTE — Progress Notes (Signed)
 Physical Therapy Treatment Patient Details Name: Charles Lloyd. MRN: [id number] DOB: 1940-06-02 Today's Date: 03/19/2024   History of Present Illness Charles Lloyd. is a 84 y.o. male who presented to the emergency department on 02/22/2024 for leg pain and weakness, with family reporting multiple falls over the past 6 to 8 months.  MRI lumbar spine revealed a new 3.5 x 2.4 cm fluid collection in the left psoas muscle concerning for abscess.  Neurosurgery was consulted at that time and recommended IV antibiotics and possible aspiration of abscess.  with significant PMH of hypertension, hyperlipidemia, type 2 diabetes, atrial fibrillation on anticoagulation, s/p recent L4-5 trans psoas lumbar interbody fusion left-sided approach on 02/06/2024, CHF,gout,CAD, TKA with infection, neuropathy    PT Comments  Patient lethargic, barely opens eys, son reports has been sleeping all day. Assisted to sitting with not any change in arousal and not engaging in activity. Patient will benefit from continued inpatient follow up therapy, <3 hours/day     If plan is discharge home, recommend the following: Assist for transportation;Help with stairs or ramp for entrance;Assistance with cooking/housework;A lot of help with bathing/dressing/bathroom;Two people to help with walking and/or transfers   Can travel by private vehicle        Equipment Recommendations  None recommended by PT    Recommendations for Other Services       Precautions / Restrictions Precautions Precautions: Fall Precaution/Restrictions Comments: trunk flexion at baseline     Mobility  Bed Mobility Overal bed mobility: Needs Assistance Bed Mobility: Supine to Sit, Sit to Supine Rolling: Total assist, +2 for physical assistance, +2 for safety/equipment   Supine to sit: +2 for physical assistance, +2 for safety/equipment, Total assist   Sit to sidelying: +2 for physical assistance, +2 for safety/equipment, Total  assist General bed mobility comments: pt does not assist  with any mobility, more resisitive    Transfers                   General transfer comment: unable    Ambulation/Gait                   Stairs             Wheelchair Mobility     Tilt Bed    Modified Rankin (Stroke Patients Only)       Balance Overall balance assessment: History of Falls Sitting-balance support: Feet supported Sitting balance-Leahy Scale: Zero                                      Communication    Cognition Arousal: Obtunded                             PT - Cognition Comments: pt does not respond to  verbal, did  perform 3 leg raises when sitting when PT moved legs        Cueing    Exercises      General Comments        Pertinent Vitals/Pain Pain Assessment Pain Assessment: PAINAD Breathing: occasional labored breathing, short period of hyperventilation Negative Vocalization: occasional moan/groan, low speech, negative/disapproving quality Facial Expression: sad, frightened, frown Body Language: tense, distressed pacing, fidgeting Consolability: no need to console PAINAD Score: 4    Home Living  Prior Function            PT Goals (current goals can now be found in the care plan section) Progress towards PT goals: Not progressing toward goals - comment    Frequency           PT Plan      Co-evaluation              AM-PAC PT 6 Clicks Mobility   Outcome Measure  Help needed turning from your back to your side while in a flat bed without using bedrails?: Total Help needed moving from lying on your back to sitting on the side of a flat bed without using bedrails?: Total Help needed moving to and from a bed to a chair (including a wheelchair)?: Total Help needed standing up from a chair using your arms (e.g., wheelchair or bedside chair)?: Total Help needed to walk in  hospital room?: Total Help needed climbing 3-5 steps with a railing? : Total 6 Click Score: 6    End of Session   Activity Tolerance: Patient limited by lethargy Patient left: in bed;with call bell/phone within reach;with bed alarm set;with family/visitor present Nurse Communication: Mobility status;Need for lift equipment PT Visit Diagnosis: Muscle weakness (generalized) (M62.81);Difficulty in walking, not elsewhere classified (R26.2)     Time: 8545-8492 PT Time Calculation (min) (ACUTE ONLY): 13 min  Charges:    $Therapeutic Activity: 8-22 mins PT General Charges $$ ACUTE PT VISIT: 1 Visit                     Darice Potters PT Acute Rehabilitation Services Office [phone]    Potters Darice Norris 03/19/2024, 4:34 PM

## 2024-03-20 DIAGNOSIS — K6812 Psoas muscle abscess: Secondary | ICD-10-CM | POA: Diagnosis not present

## 2024-03-20 LAB — GLUCOSE, CAPILLARY
Glucose-Capillary: 69 mg/dL — ABNORMAL LOW (ref 70–99)
Glucose-Capillary: 72 mg/dL (ref 70–99)
Glucose-Capillary: 78 mg/dL (ref 70–99)
Glucose-Capillary: 82 mg/dL (ref 70–99)

## 2024-03-20 LAB — COMPREHENSIVE METABOLIC PANEL WITH GFR
ALT: 94 U/L — ABNORMAL HIGH (ref 0–44)
AST: 80 U/L — ABNORMAL HIGH (ref 15–41)
Albumin: 2.3 g/dL — ABNORMAL LOW (ref 3.5–5.0)
Alkaline Phosphatase: 74 U/L (ref 38–126)
Anion gap: 11 (ref 5–15)
BUN: 19 mg/dL (ref 8–23)
CO2: 22 mmol/L (ref 22–32)
Calcium: 9 mg/dL (ref 8.9–10.3)
Chloride: 113 mmol/L — ABNORMAL HIGH (ref 98–111)
Creatinine, Ser: 1.59 mg/dL — ABNORMAL HIGH (ref 0.61–1.24)
GFR, Estimated: 43 mL/min — ABNORMAL LOW
Glucose, Bld: 73 mg/dL (ref 70–99)
Potassium: 3.6 mmol/L (ref 3.5–5.1)
Sodium: 145 mmol/L (ref 135–145)
Total Bilirubin: 0.5 mg/dL (ref 0.0–1.2)
Total Protein: 4.9 g/dL — ABNORMAL LOW (ref 6.5–8.1)

## 2024-03-20 LAB — CK: Total CK: 82 U/L (ref 49–397)

## 2024-03-20 MED ORDER — ENSURE PLUS HIGH PROTEIN PO LIQD
237.0000 mL | Freq: Two times a day (BID) | ORAL | Status: DC
  Administered 2024-03-20 – 2024-03-31 (×7): 237 mL via ORAL

## 2024-03-20 MED ORDER — ALLOPURINOL 100 MG PO TABS
100.0000 mg | ORAL_TABLET | Freq: Every day | ORAL | Status: DC
  Administered 2024-03-20 – 2024-03-31 (×12): 100 mg via ORAL
  Filled 2024-03-20 (×12): qty 1

## 2024-03-20 MED ORDER — ADULT MULTIVITAMIN W/MINERALS CH
1.0000 | ORAL_TABLET | Freq: Every day | ORAL | Status: DC
  Administered 2024-03-20 – 2024-03-31 (×12): 1 via ORAL
  Filled 2024-03-20 (×12): qty 1

## 2024-03-20 MED ORDER — APIXABAN 2.5 MG PO TABS
2.5000 mg | ORAL_TABLET | Freq: Two times a day (BID) | ORAL | Status: DC
  Administered 2024-03-20 – 2024-03-31 (×23): 2.5 mg via ORAL
  Filled 2024-03-20 (×23): qty 1

## 2024-03-20 MED ORDER — FLECAINIDE ACETATE 50 MG PO TABS
50.0000 mg | ORAL_TABLET | Freq: Two times a day (BID) | ORAL | Status: DC
  Administered 2024-03-20 – 2024-03-31 (×22): 50 mg via ORAL
  Filled 2024-03-20 (×23): qty 1

## 2024-03-20 NOTE — Plan of Care (Signed)
  Problem: Education: Goal: Ability to describe self-care measures that may prevent or decrease complications (Diabetes Survival Skills Education) will improve Outcome: Progressing Goal: Individualized Educational Video(s) Outcome: Progressing   Problem: Coping: Goal: Ability to adjust to condition or change in health will improve Outcome: Progressing   Problem: Fluid Volume: Goal: Ability to maintain a balanced intake and output will improve Outcome: Progressing   Problem: Health Behavior/Discharge Planning: Goal: Ability to identify and utilize available resources and services will improve Outcome: Progressing Goal: Ability to manage health-related needs will improve Outcome: Progressing   Problem: Nutritional: Goal: Maintenance of adequate nutrition will improve Outcome: Progressing Goal: Progress toward achieving an optimal weight will improve Outcome: Progressing   

## 2024-03-20 NOTE — Progress Notes (Signed)
 Occupational Therapy Treatment Patient Details Name: Charles Lloyd. MRN: [id number] DOB: Jun 14, 1940 Today's Date: 03/20/2024   History of present illness Charles Lloyd. is a 84 yr old male who presented to the emergency department on 02/22/2024 for leg pain and weakness, with family reporting multiple falls over the past 6 to 8 months.  MRI lumbar spine revealed a new 3.5 x 2.4 cm fluid collection in the left psoas muscle concerning for abscess.  Neurosurgery was consulted at that time and recommended IV antibiotics and possible aspiration of abscess.  PMH of hypertension, hyperlipidemia, type 2 diabetes, atrial fibrillation on anticoagulation, s/p recent L4-5 trans psoas lumbar interbody fusion left-sided approach on 02/06/2024, CHF,gout,CAD, TKA with infection, neuropathy   OT comments  OT instructed the pt on self-feeding in bed. The pt required max tactile including hand-over-hand assist with verbal cues to scoop food and to  bring it to his mouth. He presented with minimal purposeful initation of self-feeding. OT also had to prompt the pt  to chew food then to swallow, as he appeared to hold the food in his mouth. Pt noted to drop utensils on several instances. The pt did not initiate bed mobility to prompt, and subsequently required total assist to roll left & right as well as to scoot up in the bed. Overall, pt was noted to be with slight lethargy, BUE edema, inconsistent command follow, and impaired initiation and problem solving. His speech was often difficult to understand. Continue OT plan of care to address the pt's functional deficits and impaired ADL performance. Patient will benefit from continued inpatient follow up therapy, <3 hours/day.       If plan is discharge home, recommend the following:  Help with stairs or ramp for entrance;Assistance with cooking/housework;Assist for transportation;Direct supervision/assist for medications management;Direct supervision/assist for  financial management;Two people to help with walking and/or transfers;Two people to help with bathing/dressing/bathroom;Assistance with feeding   Equipment Recommendations  Other (comment) (defer to next venue)    Recommendations for Other Services      Precautions / Restrictions Precautions Precautions: Fall Restrictions Weight Bearing Restrictions Per Provider Order: No       Mobility Bed Mobility Overal bed mobility: Needs Assistance   Rolling: Total assist              Transfers                   General transfer comment: The pt did not initiate rolling when prompted. He subsequently required total assist to roll left and right in bed, as well as total assist to scoot up in the bed.         ADL either performed or assessed with clinical judgement   ADL Overall ADL's : Needs assistance/impaired Eating/Feeding: Maximal assistance;Bed level;Cueing for sequencing Eating/Feeding Details (indicate cue type and reason): OT instructed the pt on self-feeding in bed. Prior OT note indicated a foam block was issued for utensils to assist with feeding, however the foam block was not present in the room. The pt required max tactile including hand-over-hand assist with verbal cues to scoop food andto  bring it to his mouth. He presented with minimal purposeful initation of self-feeding. OT also had to prompt the pt  to chew food then to swallow, as he appeared to hold the food in his mouth. Pt noted to drop utensils on several instances. Grooming: Moderate assistance;Bed level;Cueing for sequencing Grooming Details (indicate cue type and reason): OT placed a wash cloth  in the pt's hand. He required mod cues for initiation, sustained attention, and thoroughess with tasks, in order to perform face washing at bed level.             Lower Body Dressing: Total assistance;Bed level Lower Body Dressing Details (indicate cue type and reason): for sock management                     Extremity/Trunk Assessment Upper Extremity Assessment Upper Extremity Assessment: LUE deficits/detail;RUE deficits/detail RUE Deficits / Details: Edema noted. Pt unable to fully raise shoulder against gravity. Required AAROM for elbow and hand. Generalized weakness. RUE Coordination: decreased fine motor LUE Deficits / Details: Edema noted. Pt unable to fully raise shoulder against gravity. Required AAROM for elbow and hand. Generalized weakness. LUE Coordination: decreased fine motor   Lower Extremity Assessment Lower Extremity Assessment: Generalized weakness                 Communication Communication Communication: Impaired Factors Affecting Communication: Difficulty expressing self;Reduced clarity of speech   Cognition Arousal:  (mildly lethargic) Behavior During Therapy: Flat affect Cognition: Cognition impaired Difficult to assess due to: Impaired communication Orientation impairments: Time, Situation, Place Awareness: Intellectual awareness impaired, Online awareness impaired Memory impairment (select all impairments): Short-term memory, Working civil service fast streamer, Non-declarative long-term memory, Geneticist, Molecular long-term memory Attention impairment (select first level of impairment): Selective attention, Divided attention Executive functioning impairment (select all impairments): Problem solving, Reasoning, Sequencing, Organization, Initiation                   Following commands: Impaired Following commands impaired: Follows one step commands inconsistently      Cueing   Cueing Techniques: Verbal cues, Tactile cues, Gestural cues             Pertinent Vitals/ Pain       Pain Assessment Pain Assessment: Faces Pain Score: 3  Pain Descriptors / Indicators: Grimacing, Discomfort Pain Intervention(s): Limited activity within patient's tolerance, Monitored during session, Repositioned   Frequency  Min 2X/week        Progress Toward Goals  OT  Goals(current goals can now be found in the care plan section)     Acute Rehab OT Goals Patient Stated Goal: he did not specifically state this date OT Goal Formulation: With patient Time For Goal Achievement: 03/26/24 Potential to Achieve Goals: Fair  Plan         AM-PAC OT 6 Clicks Daily Activity     Outcome Measure   Help from another person eating meals?: A Lot Help from another person taking care of personal grooming?: A Lot Help from another person toileting, which includes using toliet, bedpan, or urinal?: Total Help from another person bathing (including washing, rinsing, drying)?: Total Help from another person to put on and taking off regular upper body clothing?: Total Help from another person to put on and taking off regular lower body clothing?: Total 6 Click Score: 8    End of Session Equipment Utilized During Treatment: Other (comment) (N/A)  OT Visit Diagnosis: Unsteadiness on feet (R26.81);Other abnormalities of gait and mobility (R26.89);Muscle weakness (generalized) (M62.81);Other symptoms and signs involving cognitive function Pain - part of body:  (generalized)   Activity Tolerance Other (comment) (Fair tolerance)   Patient Left in bed;with call bell/phone within reach;with nursing/sitter in room;with bed alarm set   Nurse Communication Other (comment) (Nurse informed about pt's BUE edema, decreased initiation of tasks, difficulty with understanding pt's verbalizations, and concern about overall decline in  function. Nurse messaged MD via secure chat.)        Time: 8699-8676 OT Time Calculation (min): 23 min  Charges: OT General Charges $OT Visit: 1 Visit OT Treatments $Self Care/Home Management : 8-22 mins $Therapeutic Activity: 8-22 mins    Delanna JINNY Lesches, OTR/L 03/20/2024, 4:21 PM

## 2024-03-20 NOTE — Progress Notes (Signed)
 Initial Nutrition Assessment  DOCUMENTATION CODES:   Non-severe (moderate) malnutrition in context of chronic illness  INTERVENTION:   -Calorie count per MD -Discussed with RN  -Ensure Plus High Protein po BID, each supplement provides 350 kcal and 20 grams of protein.   -Multivitamin with minerals daily  NUTRITION DIAGNOSIS:   Moderate Malnutrition related to chronic illness as evidenced by moderate fat depletion, moderate muscle depletion, energy intake < or equal to 75% for > or equal to 1 month.  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 He was found to have a 3.5 x 2.4 cm L psoas muscle abscess. Neurosurgery recommended IR drainage and IV antibiotics. Underwent CT-guided aspiration on 02/24/24. Developed hospital-associated delirium requiring ICU transfer, now improved. Currently on antibiotics through 04/10/2024 as per ID recommendations. Psychiatry consulted on 03/06/2024 for intermittent agitation and as per family request.  12/27: admitted after fall at home 12/29: s/p CT guided aspiration of left psoas fluid collection 1/14: EEG -encephalopathy 1/21: SLP recommending dysphagia 2 diet  Patient in room, no family present. Pt not able to give history, speaks but is difficult to understand. Breakfast tray in room but barely eaten. Milk carton open and consumed 30%. Discussed with RN, calorie count ordered per MD. Will order Ensure supplements and RN to encourage pt to drink. Since admission, PO intakes have varied between 0-50%.  Per weight records, no new weights obtained this admission. Last weight 12/29: 174 lbs.  Medications: Folic acid , Senokot, Thiamine   Labs reviewed: CBGs: 69-72    NUTRITION - FOCUSED PHYSICAL EXAM:  Flowsheet Row Most Recent Value  Orbital Region Moderate depletion  Upper Arm Region Mild depletion  Thoracic and Lumbar Region Unable to assess  Buccal Region Moderate depletion  Temple Region Moderate depletion  Clavicle Bone Region Moderate depletion  Clavicle and Acromion Bone Region Moderate depletion  Scapular Bone Region Moderate depletion  Dorsal Hand Moderate depletion  Patellar Region Unable to assess  Anterior Thigh Region Unable to assess  Posterior Calf Region Unable to assess  Edema (RD Assessment) None  Hair Reviewed  Eyes Reviewed  Mouth Reviewed  [missing teeth]  Skin Reviewed  Nails Reviewed    Diet Order:   Diet Order             DIET DYS 2 Room service appropriate? Yes; Fluid consistency: Thin  Diet effective now           Diet Carb Modified                   EDUCATION NEEDS:   Not appropriate for education at this time  Skin:  Skin Assessment: Skin Integrity Issues: Skin Integrity Issues:: Incisions, DTI DTI: sacrum. buttocks Incisions: 12/11 back  Last BM:  1/23- type 6  Height:   Ht Readings from Last 1 Encounters:  02/24/24 5' 8 (1.727 m)    Weight:   Wt Readings from Last 1 Encounters:  03/19/24 79.3 kg    BMI:  Body mass index is 26.58 kg/m.  Estimated Nutritional Needs:   Kcal:  1750-1950  Protein:  80-90g  Fluid:  1.9L/day   Morna Lee, MS, RD, LDN Inpatient Clinical Dietitian Contact via Secure chat

## 2024-03-20 NOTE — TOC Progression Note (Addendum)
 Transition of Care (TOC) - Progression Note    Patient Details  Name: Charles Lloyd. MRN: [id number] Date of Birth: [birth year]  Transition of Care [facility]) CM/SW Contact  [name], RN Phone Number:[phone]  03/20/2024, 2:45 PM  Clinical Narrative:    CM spoke with son Jaelan to discuss disposition plan and bed offers. Son would like placement at [facility]. CM has reached out to [facility] the admissions interior and spatial designer at [employer]. There is no answer. Voicemail has been left requesting return call.   [employer] shara has been initiated. Auth ID [id number]. Son Payden has been updated.    Expected Discharge Plan: Skilled Nursing Facility Barriers to Discharge: Continued Medical Work up               Expected Discharge Plan and Services In-house Referral: NA Discharge Planning Services: CM Consult Post Acute Care Choice: Skilled Nursing Facility Living arrangements for the past 2 months: Single Family Home Expected Discharge Date: 03/01/24                                     Social Drivers of Health (SDOH) Interventions SDOH Screenings   Food Insecurity: No Food Insecurity (02/23/2024)  Housing: Low Risk (02/23/2024)  Transportation Needs: No Transportation Needs (02/23/2024)  Utilities: Not At Risk (02/23/2024)  Alcohol  Screen: Low Risk (12/21/2022)  Depression (PHQ2-9): Low Risk (02/11/2024)  Financial Resource Strain: Low Risk (12/21/2022)  Physical Activity: Inactive (12/21/2022)  Social Connections: Socially Isolated (02/23/2024)  Stress: No Stress Concern Present (12/21/2022)  Tobacco Use: Low Risk (02/22/2024)  Health Literacy: Adequate Health Literacy (12/21/2022)    Readmission Risk Interventions    02/26/2024    2:28 PM 08/08/2021   10:00 AM  Readmission Risk Prevention Plan  Transportation Screening Complete Complete  PCP or Specialist Appt within 3-5 Days Complete   HRI or Home Care Consult Complete   Social Work Consult for  Recovery Care Planning/Counseling Complete   Palliative Care Screening Not Applicable   Medication Review Oceanographer) Complete Complete  PCP or Specialist appointment within 3-5 days of discharge  Complete  HRI or Home Care Consult  Complete  SW Recovery Care/Counseling Consult  Not Complete  Palliative Care Screening  Not Applicable  Skilled Nursing Facility  Not Applicable

## 2024-03-20 NOTE — Progress Notes (Signed)
 " PROGRESS NOTE    Charles Lloyd Charles Lloyd.  FMW:[id number] DOB: 01/19/1941 DOA: 02/22/2024 PCP: Theophilus Andrews, Tully GRADE, MD   Brief Narrative:  [phone] with h/o HTN, HLD, T2DM, afib on Eliquis , and L4-5 spondylolisthesis s/p fusion on 12/11 who presented with LLE weakness and pain with falls. He was found to have a 3.5 x 2.4 cm L psoas muscle abscess. Neurosurgery recommended IR drainage and IV antibiotics. Underwent CT-guided aspiration on 02/24/24. Developed hospital-associated delirium requiring ICU transfer, now improved. Currently on antibiotics through 04/10/2024 as per ID recommendations. Psychiatry consulted on 03/06/2024 for intermittent agitation and as per family request. Palliative care also consulted for goals of care discussion.  Patient remains medically stable for discharge at this point - mental status (while waxing and waning) has continued to slowly improve over the past few days. Will likely need prolonged therapy in regards to speech and physical/occupational - plan to discharge to SNF once facility is chosen, bed is available, and insurance has authorized.  Assessment & Plan:   Principal Problem:   Psoas abscess, left (HCC) Active Problems:   Paroxysmal A-fib (HCC)   Chronic heart failure with preserved ejection fraction (HFpEF) (HCC)   NIDDM-2 with polyneuropathy and hyperglycemia   Hypercholesterolemia   Hyponatremia   Asthma   Essential hypertension   Recurrent falls   Altered mental status   Alcohol  withdrawal syndrome, with delirium (HCC)   Delirium   Pressure injury   Acute on chronic urinary retention   Stress reaction causing mixed disturbance of emotion and conduct   Peripheral neuropathy   Left psoas abscess, POA Sepsis POA in the setting of tachycardia fever and infection - Patient noted to have had recent transverse lumbar interbody fusion on 02/06/2024. - Patient seen in consultation by IR and patient underwent CT-guided aspiration of left psoas fluid  collection with drain placement. - Cultures remain negative - Infectious disease following, transition back transition back to daptomycin  given resolution of CK, add ertapenem  through 04/10/2024    Acute metabolic encephalopathy/delirium/alcohol  withdrawal syndrome with delirium/possible underlying dementia Cannot rule out polypharmacy - Patient baseline sharp as attack per son - Initial agitation likely in the setting of infection - Subsequent medications including benzodiazepine, Precedex , phenobarbital  with no real improvement in patient's mental status and ongoing agitation -given age polypharmacy is of concern - Subsequent renal dysfunction and liver dysfunction in the setting of multiple medications and infection likely contributing as well - MRI 02/24/2024 negative, repeat CT head on 03/10/2024 negative for acute process - Labs wholly unremarkable including ammonia, B12, TSH,, TSH, - EEG unrevealing -consistent with metabolic/toxic etiology - Psychiatry consulted per family request 03/06/2024 -initiated on Rozerem  with subsequent somnolence somnolence somnolence and stupor with questionable tremors and poor p.o. intake -subsequently discontinued - Long longstanding gabapentin  also discontinued in this timeframe - Continue to follow patient clinically given ongoing improvement, although mild, over the past few days -continue advance diet as tolerated  Acute kidney injury, resolving Hypovolemic hyponatremia, resolved Metabolic acidosis - Likely multifactorial in setting of poor p.o. 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 intake and IV fluids - ANA, ANCA -within normal limits, kappa lambda light chains noted to be mildly elevated but ratio within normal limits - Nephrology was following but have signed off.  - Bicarb tablets discontinued.   Elevated LFTs, resolving -Likely  multifactorial similar to a late AKI as above in the setting of poor p.o. intake, hypovolemia, hypotension and  medication injury - Hepatitis panel negative other than reactive hepatitis C antibody(false positive given subsequent confirmatory tests negative) - Imaging unremarkable other than other than hepatic steatosis and slightly echogenic liver, nonspecific - Statin discontinued - Restart daptomycin  given discussion with ID, vancomycin  discontinued - GI previously consulted with concern for drug-induced liver injury    Paroxysmal atrial fibrillation - Continue Coreg  and flecainide  for rate control. - Eliquis  for anticoagulation.  Chronic diastolic heart failure/hypertension/hyperlipidemia - Continue hydralazine , Coreg . 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  - Continue to hold gabapentin  secondary to somnolence/drowsiness. - Continue SSI, hypoglycemic protocol   Asthma -Stable, not in acute exacerbation. - Montelukast .   Recurrent falls -PT/OT. - Tentative plan for discharge to SNF   Acute on chronic urinary retention -Patient noted to have failed voiding trial on 03/04/2024 and Foley catheter had to be placed on 03/04/2024. -Continue Flomax . - Plan to discharge with Foley catheter in place with outpatient follow-up with urology.   Anemia of chronic disease - Hemoglobin stable   Pressure injury, POA Wound 02/24/24 1245 Pressure Injury Buttocks Mid;Lower Deep Tissue Pressure Injury - Purple or maroon localized area of discolored intact skin or blood-filled blister due to damage of underlying soft tissue from pressure and/or shear. (Active)     Wound 02/24/24 1245 Pressure Injury  Scrotum Circumferential Deep Tissue Pressure Injury - Purple or maroon localized area of discolored intact skin or blood-filled blister due to damage of underlying soft tissue from pressure and/or shear. (Active)      DVT prophylaxis: SCDs Start: 02/23/24 0010 apixaban  (ELIQUIS ) tablet 5 mg   Code Status:   Code Status: Do not attempt resuscitation (DNR) PRE-ARREST INTERVENTIONS DESIRED  Family Communication: Son updated over the phone  Status is: Inpatient  Dispo: The patient is from: Home              Anticipated d/c is to: Facility              Anticipated d/c date is: 24-48              Patient currently hours not medically stable for discharge  Consultants:  Nephrology, GI, infectious disease, psychiatry  Antimicrobials:  Continue above daptomycin  and ertapenem  through 04/10/2024 per ID  Subjective: No acute issues or events overnight patient's review of systems is somewhat limited given difficulty with speech shakes his head no to pain  Objective: Vitals:   03/19/24 0540 03/19/24 1423 03/19/24 2021 03/20/24 0340  BP:  128/80 (!) 140/83 123/71  Pulse:  85 86 86  Resp:  20 18 18   Temp:  (!) 100.4 F (38 C) 99.6 F (37.6 C) 97.9 F (36.6 C)  TempSrc:  Oral Oral Oral  SpO2:  92% 94% 91%  Weight: 79.3 kg     Height:        Intake/Output Summary (Last 24 hours) at 03/20/2024 0735 Last data filed at 03/20/2024 9367 Gross per 24 hour  Intake 140 ml  Output --  Net 140 ml   Filed Weights   02/22/24 1238 02/24/24 1245 03/19/24 0540  Weight: 79.4 kg 79.3 kg 79.3 kg    Examination:  General:  Pleasantly resting in bed, No acute distress.  Notable stutter and word finding  difficulty HEENT:  Normocephalic atraumatic.  Sclerae nonicteric, noninjected.  Extraocular movements intact bilaterally. Neck:  Without mass or deformity.  Trachea is midline. Lungs:  Clear to auscultate bilaterally without rhonchi, wheeze, or rales. Heart:  Regular rate and rhythm.  Without  murmurs, rubs, or gallops. Abdomen:  Soft, nontender, nondistended.  Without guarding or rebound. Extremities: Without cyanosis, or edema   Data Reviewed: I have personally reviewed following labs and imaging studies  CBC: Recent Labs  Lab 03/14/24 0131 03/15/24 0346 03/16/24 0149 03/17/24 0049 03/18/24 0940  WBC 5.8 4.7 5.2 3.7* 3.7*  NEUTROABS  --   --   --   --  1.6*  HGB 8.2* 7.8* 8.0* 7.6* 8.1*  HCT 25.6* 24.1* 25.0* 24.3* 26.0*  MCV 91.1 90.3 91.6 91.7 93.2  PLT 178 185 214 184 208   Basic Metabolic Panel: Recent Labs  Lab 03/15/24 0346 03/16/24 0149 03/17/24 0049 03/18/24 0940 03/20/24 0540  NA 141 142 141 145 145  K 3.6 3.5 3.3* 3.7 3.6  CL 110 111 110 113* 113*  CO2 23 21* 23 22 22   GLUCOSE 112* 104* 125* 96 73  BUN 25* 24* 22 21 19   CREATININE 1.59* 1.73* 1.57* 1.44* 1.59*  CALCIUM  9.0 9.0 9.3 9.4 9.0   GFR: Estimated Creatinine Clearance: 35.3 mL/min (A) (by C-G formula based on SCr of 1.59 mg/dL (H)). Liver Function Tests: Recent Labs  Lab 03/15/24 0346 03/16/24 0149 03/17/24 0049 03/18/24 0940 03/20/24 0540  AST 441* 305* 210* 147* 80*  ALT 192* 173* 162* 147* 94*  ALKPHOS 84 86 80 101 74  BILITOT 0.4 0.5 0.5 0.5 0.5  PROT 4.7* 5.1* 4.7* 5.0* 4.9*  ALBUMIN  2.4* 2.4* 2.6* 2.5* 2.3*   No results for input(s): LIPASE, AMYLASE in the last 168 hours. Recent Labs  Lab 03/16/24 2104  AMMONIA 19   Coagulation Profile: No results for input(s): INR, PROTIME in the last 168 hours. Cardiac Enzymes: Recent Labs  Lab 03/14/24 0131 03/16/24 0149 03/18/24 0940 03/20/24 0540  CKTOTAL 3,694* 1,065* 209 82   CBG: Recent Labs  Lab 03/19/24 0849 03/19/24 1147 03/19/24 1237 03/19/24 1746 03/19/24 2154  GLUCAP 103* 70 92 71 81   Recent Results (from the past 240 hours)  MRSA Next Gen by PCR, Nasal     Status: None   Collection Time: 03/11/24  5:49 PM   Specimen: Nasal Mucosa; Nasal Swab  Result Value Ref Range Status   MRSA by PCR  Next Gen NOT DETECTED NOT DETECTED Final    Comment: (NOTE) The GeneXpert MRSA Assay (FDA approved for NASAL specimens only), is one component of a comprehensive MRSA colonization surveillance program. It is not intended to diagnose MRSA infection nor to guide or monitor treatment for MRSA infections. Test performance is not FDA approved in patients less than 38 years old. Performed at [facility], [address], [place], [place] [address]     Radiology Studies: DG Lumbar Spine 2-3 Views Result Date: 03/19/2024 CLINICAL DATA:  Elective surgery. History of recent falls. Numbness of legs. Recent surgery. EXAM: LUMBAR SPINE - 2-3 VIEW COMPARISON:  Lumbar spine CT 02/22/2024, radiograph 02/06/2024 FINDINGS: Technically limited due to patient's difficulty with positioning. S1 is transitional. The lateral views are significantly oblique. Posterior rod and pedicle screw fixation at L4-L5. The hardware is grossly intact. Interbody spacer in place. No evidence of acute fracture. Diffuse degenerative disc disease with multilevel facet hypertrophy, better delineated on prior CT. IMPRESSION: 1. Technically limited exam  due to patient's difficulty with positioning. 2. Posterior rod and pedicle screw fixation at L4-L5. No evidence of hardware complication. 3. No evidence of acute fracture, although technically limited by positioning. 4. Multilevel degenerative disc disease and facet hypertrophy. Electronically Signed   By: Andrea Gasman M.D.   On: 03/19/2024 18:08    Scheduled Meds:  allopurinol   300 mg Oral Daily   apixaban   5 mg Oral BID   carvedilol   3.125 mg Oral BID   Chlorhexidine  Gluconate Cloth  6 each Topical Daily   cycloSPORINE   1 drop Both Eyes Daily   flecainide   100 mg Oral BID   folic acid   1 mg Oral Daily   hydrALAZINE   25 mg Oral BID   hydrocortisone  cream   Topical TID   insulin  aspart  0-6 Units Subcutaneous TID AC & HS   montelukast   10 mg Oral QHS    senna-docusate  1 tablet Oral QHS   sodium chloride  flush  3 mL Intravenous Q12H   tamsulosin   0.4 mg Oral QPC supper   thiamine   100 mg Oral Daily   Continuous Infusions:  DAPTOmycin  600 mg (03/19/24 1530)   ertapenem  1 g (03/19/24 1241)     LOS: 27 days   Time spent:  Elsie JAYSON Montclair, DO [facility]  If 7PM-7AM, please contact night-coverage [email or web address]  03/20/2024, 7:35 AM      "

## 2024-03-21 DIAGNOSIS — K6812 Psoas muscle abscess: Secondary | ICD-10-CM | POA: Diagnosis not present

## 2024-03-21 DIAGNOSIS — E440 Moderate protein-calorie malnutrition: Secondary | ICD-10-CM | POA: Insufficient documentation

## 2024-03-21 LAB — COMPREHENSIVE METABOLIC PANEL WITH GFR
ALT: 84 U/L — ABNORMAL HIGH (ref 0–44)
AST: 61 U/L — ABNORMAL HIGH (ref 15–41)
Albumin: 2.6 g/dL — ABNORMAL LOW (ref 3.5–5.0)
Alkaline Phosphatase: 82 U/L (ref 38–126)
Anion gap: 12 (ref 5–15)
BUN: 19 mg/dL (ref 8–23)
CO2: 21 mmol/L — ABNORMAL LOW (ref 22–32)
Calcium: 9.3 mg/dL (ref 8.9–10.3)
Chloride: 113 mmol/L — ABNORMAL HIGH (ref 98–111)
Creatinine, Ser: 1.64 mg/dL — ABNORMAL HIGH (ref 0.61–1.24)
GFR, Estimated: 42 mL/min — ABNORMAL LOW
Glucose, Bld: 78 mg/dL (ref 70–99)
Potassium: 3.6 mmol/L (ref 3.5–5.1)
Sodium: 146 mmol/L — ABNORMAL HIGH (ref 135–145)
Total Bilirubin: 0.7 mg/dL (ref 0.0–1.2)
Total Protein: 5.3 g/dL — ABNORMAL LOW (ref 6.5–8.1)

## 2024-03-21 LAB — GLUCOSE, CAPILLARY
Glucose-Capillary: 72 mg/dL (ref 70–99)
Glucose-Capillary: 69 mg/dL — ABNORMAL LOW (ref 70–99)
Glucose-Capillary: 88 mg/dL (ref 70–99)
Glucose-Capillary: 95 mg/dL (ref 70–99)

## 2024-03-21 MED ORDER — OXYBUTYNIN CHLORIDE 5 MG PO TABS: 5.0000 mg | ORAL_TABLET | Freq: Three times a day (TID) | ORAL | Status: AC | PRN

## 2024-03-21 MED ORDER — FLECAINIDE ACETATE 50 MG PO TABS: 50.0000 mg | ORAL_TABLET | Freq: Two times a day (BID) | ORAL | Status: AC

## 2024-03-21 MED ORDER — ADULT MULTIVITAMIN W/MINERALS CH: 1.0000 | ORAL_TABLET | Freq: Every day | ORAL | Status: AC

## 2024-03-21 MED ORDER — DAPTOMYCIN IV (FOR PTA / DISCHARGE USE ONLY): 600.0000 mg | INTRAVENOUS | Status: DC

## 2024-03-21 MED ORDER — ALLOPURINOL 100 MG PO TABS: 100.0000 mg | ORAL_TABLET | Freq: Every day | ORAL | Status: AC

## 2024-03-21 MED ORDER — FUROSEMIDE 20 MG PO TABS
20.0000 mg | ORAL_TABLET | Freq: Every day | ORAL | Status: AC
  Administered 2024-03-21 – 2024-03-23 (×3): 20 mg via ORAL
  Filled 2024-03-21 (×3): qty 1

## 2024-03-21 MED ORDER — TORSEMIDE 10 MG PO TABS: 10.0000 mg | ORAL_TABLET | Freq: Two times a day (BID) | ORAL | 0 refills | Status: AC

## 2024-03-21 MED ORDER — ENSURE PLUS HIGH PROTEIN PO LIQD: 237.0000 mL | Freq: Two times a day (BID) | ORAL | Status: AC

## 2024-03-21 MED ORDER — APIXABAN 2.5 MG PO TABS: 2.5000 mg | ORAL_TABLET | Freq: Two times a day (BID) | ORAL | Status: AC

## 2024-03-21 MED ORDER — DEXTROSE 50 % IV SOLN
12.5000 g | Freq: Once | INTRAVENOUS | Status: AC
  Administered 2024-03-21: 12.5 g via INTRAVENOUS
  Filled 2024-03-21: qty 50

## 2024-03-21 MED ORDER — ERTAPENEM IV (FOR PTA / DISCHARGE USE ONLY): 1.0000 g | INTRAVENOUS | Status: DC

## 2024-03-21 NOTE — Progress Notes (Deleted)
 " PROGRESS NOTE    Charles Lloyd Juanito Mickey.  FMW:[id number] DOB: December 19, 1940 DOA: 02/22/2024 PCP: Theophilus Andrews, Tully GRADE, MD   Brief Narrative:  [phone] with h/o HTN, HLD, T2DM, afib on Eliquis , and L4-5 spondylolisthesis s/p fusion on 12/11 who presented with LLE weakness and pain with falls. He was found to have a 3.5 x 2.4 cm L psoas muscle abscess. Neurosurgery recommended IR drainage and IV antibiotics. Underwent CT-guided aspiration on 02/24/24. Developed hospital-associated delirium requiring ICU transfer, now improved. Currently on antibiotics through 04/10/2024 as per ID recommendations. Psychiatry consulted on 03/06/2024 for intermittent agitation and as per family request. Palliative care also consulted for goals of care discussion.  Patient remains medically stable for discharge at this point - mental status (while waxing and waning) has continued to slowly improve over the past few days. Will likely need prolonged therapy in regards to speech and physical/occupational - plan to discharge to SNF once facility is chosen, bed is available, and insurance has authorized.  Assessment & Plan:   Principal Problem:   Psoas abscess, left (HCC) Active Problems:   Paroxysmal A-fib (HCC)   Chronic heart failure with preserved ejection fraction (HFpEF) (HCC)   NIDDM-2 with polyneuropathy and hyperglycemia   Hypercholesterolemia   Hyponatremia   Asthma   Essential hypertension   Recurrent falls   Altered mental status   Alcohol  withdrawal syndrome, with delirium (HCC)   Delirium   Pressure injury   Acute on chronic urinary retention   Stress reaction causing mixed disturbance of emotion and conduct   Peripheral neuropathy   Malnutrition of moderate degree   Left psoas abscess, POA Sepsis POA in the setting of tachycardia fever and infection - Patient noted to have had recent transverse lumbar interbody fusion on 02/06/2024. - Patient seen in consultation by IR and patient underwent  CT-guided aspiration of left psoas fluid collection with drain placement. - Cultures remain negative - Infectious disease following, transition back transition back to daptomycin  given resolution of CK, add ertapenem  through 04/10/2024    Acute metabolic encephalopathy/delirium/alcohol  withdrawal syndrome with delirium/possible underlying dementia Cannot rule out polypharmacy - Patient baseline sharp as attack per son - Initial agitation likely in the setting of infection - Subsequent medications including benzodiazepine, Precedex , phenobarbital  with no real improvement in patient's mental status and ongoing agitation -given age polypharmacy is of concern - Subsequent renal dysfunction and liver dysfunction in the setting of multiple medications and infection likely contributing as well - MRI 02/24/2024 negative, repeat CT head on 03/10/2024 negative for acute process - Labs wholly unremarkable including ammonia, B12, TSH,, TSH, - EEG unrevealing -consistent with metabolic/toxic etiology - Psychiatry consulted per family request 03/06/2024 -initiated on Rozerem  with subsequent somnolence somnolence somnolence and stupor with questionable tremors and poor p.o. intake -subsequently discontinued - Long longstanding gabapentin  also discontinued in this timeframe - Continue to follow patient clinically given ongoing improvement, although mild, over the past few days -continue advance diet as tolerated  Acute kidney injury, resolving Hypovolemic hyponatremia, resolved Metabolic acidosis - Likely multifactorial in setting of poor p.o. 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 intake and IV fluids - ANA, ANCA -within normal limits, kappa lambda light chains noted to be mildly elevated but ratio within normal limits - Nephrology was following but have signed off.  - Bicarb tablets discontinued.    Elevated LFTs, resolving -Likely multifactorial similar to a late AKI as above in the setting of  poor p.o. intake, hypovolemia, hypotension and medication injury - Hepatitis panel negative other than reactive hepatitis C antibody(false positive given subsequent confirmatory tests negative) - Imaging unremarkable other than other than hepatic steatosis and slightly echogenic liver, nonspecific - Statin discontinued - Restart daptomycin  given discussion with ID, vancomycin  discontinued - GI previously consulted with concern for drug-induced liver injury    Paroxysmal atrial fibrillation - Continue Coreg  and flecainide  for rate control. - Eliquis  for anticoagulation.  Chronic diastolic heart failure/hypertension/hyperlipidemia - Continue hydralazine , Coreg . 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  - Continue to hold gabapentin  secondary to somnolence/drowsiness. - Continue SSI, hypoglycemic protocol   Asthma -Stable, not in acute exacerbation. - Montelukast .   Recurrent falls -PT/OT. - Tentative plan for discharge to SNF   Acute on chronic urinary retention -Patient noted to have failed voiding trial on 03/04/2024 and Foley catheter had to be placed on 03/04/2024. -Continue Flomax . - Plan to discharge with Foley catheter in place with outpatient follow-up with urology.   Moderate malnutrition - advance diet as tolerated Anemia of chronic disease - Hemoglobin stable   Pressure injury, POA Wound 02/24/24 1245 Pressure Injury Buttocks Mid;Lower Deep Tissue Pressure Injury - Purple or maroon localized area of discolored intact skin or blood-filled blister due to damage of underlying soft tissue from pressure and/or shear. (Active)      Wound 02/24/24 1245 Pressure Injury Scrotum Circumferential Deep Tissue Pressure Injury - Purple or maroon localized area of discolored intact skin or blood-filled blister due to damage of underlying soft tissue from pressure and/or shear. (Active)      DVT prophylaxis: apixaban  (ELIQUIS ) tablet 2.5 mg Start: 03/20/24 1000 SCDs Start: 02/23/24 0010 apixaban  (ELIQUIS ) tablet 2.5 mg   Code Status:   Code Status: Do not attempt resuscitation (DNR) PRE-ARREST INTERVENTIONS DESIRED  Family Communication: Son updated over the phone  Status is: Inpatient  Dispo: The patient is from: Home              Anticipated d/c is to: Facility              Anticipated d/c date is: 24-48              Patient currently hours not medically stable for discharge  Consultants:  Nephrology, GI, infectious disease, psychiatry  Antimicrobials:  Continue above daptomycin  and ertapenem  through 04/10/2024 per ID  Subjective: No acute issues or events overnight patient's review of systems is somewhat limited given difficulty with speech shakes his head no to pain  Objective: Vitals:   03/20/24 0340 03/20/24 1330 03/20/24 2006 03/21/24 0520  BP: 123/71 123/66 139/69 (!) 160/61  Pulse: 86 82 87 94  Resp: 18 20 18 18   Temp: 97.9 F (36.6 C) 98.3 F (36.8 C) 98.5 F (36.9 C) 98.5 F (36.9 C)  TempSrc: Oral Oral Oral Oral  SpO2: 91% 91% 95% 94%  Weight:      Height:        Intake/Output Summary (Last 24 hours) at 03/21/2024 0901 Last data filed at 03/21/2024 0600 Gross per 24 hour  Intake 1004 ml  Output 900 ml  Net 104 ml   Filed Weights   02/22/24 1238 02/24/24 1245 03/19/24 0540  Weight: 79.4 kg 79.3 kg 79.3 kg    Examination:  General:  Pleasantly resting in bed, No acute distress.  Notable stutter and word finding difficulty HEENT:  Normocephalic atraumatic.  Sclerae nonicteric, noninjected.  Extraocular movements intact bilaterally. Neck:  Without mass or deformity.  Trachea is  midline. Lungs:  Clear to auscultate bilaterally without rhonchi, wheeze, or rales. Heart:  Regular rate and rhythm.  Without murmurs, rubs, or gallops. Abdomen:  Soft, nontender, nondistended.  Without guarding or rebound. Extremities: Without cyanosis, or edema   Data Reviewed: I have personally reviewed following labs and imaging studies  CBC: Recent Labs  Lab 03/15/24 0346 03/16/24 0149 03/17/24 0049 03/18/24 0940  WBC 4.7 5.2 3.7* 3.7*  NEUTROABS  --   --   --  1.6*  HGB 7.8* 8.0* 7.6* 8.1*  HCT 24.1* 25.0* 24.3* 26.0*  MCV 90.3 91.6 91.7 93.2  PLT 185 214 184 208   Basic Metabolic Panel: Recent Labs  Lab 03/15/24 0346 03/16/24 0149 03/17/24 0049 03/18/24 0940 03/20/24 0540  NA 141 142 141 145 145  K 3.6 3.5 3.3* 3.7 3.6  CL 110 111 110 113* 113*  CO2 23 21* 23 22 22   GLUCOSE 112* 104* 125* 96 73  BUN 25* 24* 22 21 19   CREATININE 1.59* 1.73* 1.57* 1.44* 1.59*  CALCIUM  9.0 9.0 9.3 9.4 9.0   GFR: Estimated Creatinine Clearance: 35.3 mL/min (A) (by C-G formula based on SCr of 1.59 mg/dL (H)). Liver Function Tests: Recent Labs  Lab 03/15/24 0346 03/16/24 0149 03/17/24 0049 03/18/24 0940 03/20/24 0540  AST 441* 305* 210* 147* 80*  ALT 192* 173* 162* 147* 94*  ALKPHOS 84 86 80 101 74  BILITOT 0.4 0.5 0.5 0.5 0.5  PROT 4.7* 5.1* 4.7* 5.0* 4.9*  ALBUMIN  2.4* 2.4* 2.6* 2.5* 2.3*   No results for input(s): LIPASE, AMYLASE in the last 168 hours. Recent Labs  Lab 03/16/24 2104  AMMONIA 19   Coagulation Profile: No results for input(s): INR, PROTIME in the last 168 hours. Cardiac Enzymes: Recent Labs  Lab 03/16/24 0149 03/18/24 0940 03/20/24 0540  CKTOTAL 1,065* 209 82   CBG: Recent Labs  Lab 03/20/24 0738 03/20/24 1139 03/20/24 1630 03/20/24 2316 03/21/24 0808  GLUCAP 69* 72 78 82 72   Recent Results (from the past 240 hours)  MRSA Next Gen by PCR, Nasal     Status: None   Collection Time: 03/11/24  5:49 PM   Specimen: Nasal  Mucosa; Nasal Swab  Result Value Ref Range Status   MRSA by PCR Next Gen NOT DETECTED NOT DETECTED Final    Comment: (NOTE) The GeneXpert MRSA Assay (FDA approved for NASAL specimens only), is one component of a comprehensive MRSA colonization surveillance program. It is not intended to diagnose MRSA infection nor to guide or monitor treatment for MRSA infections. Test performance is not FDA approved in patients less than 87 years old. Performed at [facility], [address] [place], [place] [address]     Radiology Studies: DG Lumbar Spine 2-3 Views Result Date: 03/19/2024 CLINICAL DATA:  Elective surgery. History of recent falls. Numbness of legs. Recent surgery. EXAM: LUMBAR SPINE - 2-3 VIEW COMPARISON:  Lumbar spine CT 02/22/2024, radiograph 02/06/2024 FINDINGS: Technically limited due to patient's difficulty with positioning. S1 is transitional. The lateral views are significantly oblique. Posterior rod and pedicle screw fixation at L4-L5. The hardware is grossly intact. Interbody spacer in place. No evidence of acute fracture. Diffuse degenerative disc disease with multilevel facet hypertrophy, better delineated on prior CT. IMPRESSION: 1. Technically limited exam  due to patient's difficulty with positioning. 2. Posterior rod and pedicle screw fixation at L4-L5. No evidence of hardware complication. 3. No evidence of acute fracture, although technically limited by positioning. 4. Multilevel degenerative disc disease and facet hypertrophy. Electronically Signed   By: Andrea Gasman M.D.   On: 03/19/2024 18:08    Scheduled Meds:  allopurinol   100 mg Oral Daily   apixaban   2.5 mg Oral BID   carvedilol   3.125 mg Oral BID   Chlorhexidine  Gluconate Cloth  6 each Topical Daily   cycloSPORINE   1 drop Both Eyes Daily   feeding supplement  237 mL Oral BID BM   flecainide   50 mg Oral BID   folic acid   1 mg Oral Daily   furosemide   20 mg Oral Daily   hydrALAZINE   25 mg  Oral BID   hydrocortisone  cream   Topical TID   insulin  aspart  0-6 Units Subcutaneous TID AC & HS   montelukast   10 mg Oral QHS   multivitamin with minerals  1 tablet Oral Daily   senna-docusate  1 tablet Oral QHS   sodium chloride  flush  3 mL Intravenous Q12H   tamsulosin   0.4 mg Oral QPC supper   thiamine   100 mg Oral Daily   Continuous Infusions:  DAPTOmycin  600 mg (03/20/24 1443)   ertapenem  1 g (03/20/24 1337)     LOS: 28 days   Time spent:  Elsie JAYSON Montclair, DO [facility]  If 7PM-7AM, please contact night-coverage [email or web address]  03/21/2024, 9:01 AM      "

## 2024-03-21 NOTE — Discharge Summary (Signed)
 Physician Discharge Summary  Charles Lloyd Charles Lloyd. 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 He was found to have a 3.5 x 2.4 cm L psoas muscle abscess. Neurosurgery recommended IR drainage and IV antibiotics. Underwent CT-guided aspiration on 02/24/24. Developed hospital-associated delirium requiring ICU transfer, now improved. Currently on antibiotics through 04/10/2024 as per ID recommendations. Psychiatry consulted on 03/06/2024 for intermittent agitation and as per family request. Palliative care also consulted for goals of care discussion.   Patient remains medically stable for discharge at this point - mental status (while waxing and waning) has continued to stabilize with poorly intelligible speech but able to follow commands. Will likely need prolonged therapy in regards to speech and physical/occupational - discharge to SNF today given insurance Auth and bed approved.  Staff having difficulty contacting facility for discharge.  Unclear if patient will be able to leave our facility at this time.  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 - Patient seen in consultation by IR and patient underwent CT-guided aspiration of left psoas fluid collection with drain placement. - Cultures remain negative - Infectious disease following, transition back transition back to daptomycin  given resolution of CK, add ertapenem  through 04/10/2024    Acute metabolic encephalopathy/delirium/alcohol  withdrawal syndrome with delirium/possible underlying dementia Cannot rule out polypharmacy - Patient baseline sharp as attack per son - Initial agitation likely in the setting of infection - Subsequent medications including benzodiazepine, Precedex , phenobarbital  with no real improvement in patient's mental status and ongoing agitation -given age polypharmacy is of concern - Subsequent renal dysfunction and liver dysfunction in the setting of multiple medications and infection likely contributing as well - MRI 02/24/2024 negative, repeat CT head on 03/10/2024 negative for acute process - Labs wholly unremarkable including ammonia, B12, TSH,, TSH, - EEG unrevealing -consistent with metabolic/toxic etiology - Psychiatry consulted per family request 03/06/2024 -initiated on Rozerem  with subsequent somnolence somnolence somnolence and stupor with questionable tremors and poor p.o. intake -subsequently discontinued - Long longstanding gabapentin  also discontinued in this timeframe - Continue to follow patient clinically given ongoing improvement, although mild, over the past few days -continue advance diet as tolerated   Acute kidney injury, resolving Hypovolemic hyponatremia,  resolved Metabolic acidosis - Likely multifactorial in setting of poor p.o. 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 intake and IV fluids - ANA, ANCA -within normal limits, kappa lambda light chains noted to be mildly elevated but ratio within normal limits - Nephrology was following but have signed off.  - Bicarb tablets discontinued.    Elevated LFTs, resolving -Likely multifactorial similar to a late AKI as above in the setting of poor p.o. intake, hypovolemia, hypotension and medication injury - Hepatitis panel negative other than reactive hepatitis C antibody(false positive given subsequent confirmatory tests negative) - Imaging unremarkable other than other than hepatic steatosis and slightly echogenic liver, nonspecific - Statin discontinued - Restart daptomycin  given discussion with ID, vancomycin  discontinued - GI previously consulted with concern for drug-induced liver injury    Paroxysmal atrial fibrillation - Continue Coreg  and flecainide  for rate control. - Eliquis  for anticoagulation.   Chronic diastolic heart failure/hypertension/hyperlipidemia - Continue hydralazine , Coreg . 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  - Continue to hold gabapentin  secondary to somnolence/drowsiness.   Asthma -Stable, not in acute exacerbation. - Continue montelukast .   Recurrent falls -PT/OT. - Tentative plan for discharge to SNF   Acute on chronic urinary retention - Patient noted to have failed voiding trial on 03/04/2024 and Foley catheter had to be placed on 03/04/2024. - Continue Flomax . - Plan to discharge with Foley catheter in place with outpatient follow-up with urology.   Moderate malnutrition - advance diet as  tolerated Anemia of chronic disease - Hemoglobin stable   Pressure injury, POA Wound 02/24/24 1245 Pressure Injury Buttocks Mid;Lower Deep Tissue Pressure Injury - Purple or maroon localized area of discolored intact skin or blood-filled blister due to damage of underlying soft tissue from pressure and/or shear. (Active)     Wound 02/24/24 1245 Pressure Injury Scrotum Circumferential Deep Tissue Pressure Injury - Purple or maroon localized area of discolored intact skin or blood-filled blister due to damage of underlying soft tissue from pressure and/or shear. (Active)      Discharge Instructions  Discharge Instructions     [facility] pharmacist to adjust dose for Vancomycin , Aminoglycosides and other anti-infective therapies as requested by physician.   Complete by: As directed    [facility] to provide Cath Flo 2mg    Complete by: As directed    Administer for PICC line occlusion and as ordered by physician for other access device issues.   Ambulatory referral to Urology   Complete by: As directed    Anaphylaxis Kit: Provided to treat any anaphylactic reaction to the medication being provided to the patient if First Dose or when requested by physician   Complete by: As directed    Epinephrine  1mg /ml vial / amp: Administer 0.3mg  (0.63ml) subcutaneously once for moderate to severe anaphylaxis, nurse to call physician and pharmacy when reaction occurs and call 911 if needed for immediate care   Diphenhydramine  50mg /ml IV vial: Administer 25-50mg  IV/IM PRN for first dose reaction, rash, itching, mild reaction, nurse to call physician and pharmacy when reaction occurs   Sodium Chloride  0.9% NS 500ml IV: Administer if needed for hypovolemic blood pressure drop or as ordered by physician after call to physician with anaphylactic reaction   Call MD for:  difficulty breathing, headache or visual disturbances   Complete by: As directed    Call MD for:  difficulty breathing,  headache or visual disturbances   Complete by: As  directed    Call MD for:  extreme fatigue   Complete by: As directed    Call MD for:  extreme fatigue   Complete by: As directed    Call MD for:  hives   Complete by: As directed    Call MD for:  hives   Complete by: As directed    Call MD for:  persistant dizziness or light-headedness   Complete by: As directed    Call MD for:  persistant dizziness or light-headedness   Complete by: As directed    Call MD for:  persistant nausea and vomiting   Complete by: As directed    Call MD for:  severe uncontrolled pain   Complete by: As directed    Call MD for:  severe uncontrolled pain   Complete by: As directed    Call MD for:  temperature >100.4   Complete by: As directed    Call MD for:  temperature >100.4   Complete by: As directed    Catheter care   Complete by: As directed    Change dressing on IV access line weekly and PRN   Complete by: As directed    Diet Carb Modified   Complete by: As directed    Diet general   Complete by: As directed    Dysphagia 2, thin liquids   Discharge instructions   Complete by: As directed    1. You are being discharged to short-term rehabilitation at [facility] 2. Continue Daptomycin /Cefepime  via PICC line through 04/13/24 (including after discharge from rehab) 3. Recommend CBC/BMP in the next 48 hours 4. Continue foley catheter; follow up with urology for voiding trial in the future 5. [facility] at 11[facility] 6. Follow up with Dr. Theophilus after discharge from rehab 7. Maintain PICC line   Flush IV access with Sodium Chloride  0.9% and Heparin  10 units/ml or 100 units/ml   Complete by: As directed    Home infusion instructions   Complete by: As directed    Instructions: Flushing of vascular access device: 0.9% NaCl pre/post medication administration and prn patency; Heparin  100 u/ml, 5ml for implanted ports and Heparin  10u/ml, 5ml for all other central  venous catheters.   Home infusion instructions - [facility]   Complete by: As directed    Instructions: Flush IV access with Sodium Chloride  0.9% and Heparin  10units/ml or 100units/ml   Change dressing on IV access line: Weekly and PRN   Instructions Cath Flo 2mg : Administer for PICC Line occlusion and as ordered by physician for other access device   [facility] pharmacist to adjust dose for: Vancomycin , Aminoglycosides and other anti-infective therapies as requested by physician   Increase activity slowly   Complete by: As directed    Increase activity slowly   Complete by: As directed    Method of administration may be changed at the discretion of home infusion pharmacist based upon assessment of the patient and/or caregivers ability to self-administer the medication ordered   Complete by: As directed    No wound care   Complete by: As directed    No wound care   Complete by: As directed       Allergies as of 03/21/2024       Reactions   Amlodipine  Nausea Only, Swelling, Other (See Comments)   Severe edema and chest pain- also   Chlorhexidine  Gluconate Itching, Other (See Comments)   Only itching with wipes, not with soap  Ativan  [lorazepam ] Other (See Comments)   Agitation/hallucination - not a true allergy        Medication List     PAUSE taking these medications    potassium chloride  SA 20 MEQ tablet Wait to take this until your doctor or other care provider tells you to start again. Commonly known as: Klor-Con  M20 Take 1 tablet (20 mEq total) by mouth daily.       STOP taking these medications    atorvastatin  20 MG tablet Commonly known as: LIPITOR   cyclobenzaprine  10 MG tablet Commonly known as: FLEXERIL    oxyCODONE  5 MG immediate release tablet Commonly known as: Oxy IR/ROXICODONE        TAKE these medications    acetaminophen  325 MG tablet Commonly known as: TYLENOL  Take 2 tablets (650 mg total) by mouth every 6 (six)  hours as needed for mild pain (pain score 1-3) or fever (or Fever >/= 101).   allopurinol  100 MG tablet Commonly known as: ZYLOPRIM  Take 1 tablet (100 mg total) by mouth daily. Start taking on: March 22, 2024 What changed:  medication strength how much to take   apixaban  2.5 MG Tabs tablet Commonly known as: ELIQUIS  Take 1 tablet (2.5 mg total) by mouth 2 (two) times daily. What changed:  medication strength how much to take   carvedilol  3.125 MG tablet Commonly known as: COREG  TAKE 1 TABLET BY MOUTH TWICE A DAY What changed: when to take this   daptomycin  IVPB Commonly known as: CUBICIN  Inject 600 mg into the vein daily for 28 days. Indication:  Psoas abscess First Dose: Yes Last Day of Therapy:  04/10/24 Labs - Once weekly:  CBC/D, BMP, and CPK Labs - Once weekly: ESR and CRP Method of administration: IV Push Method of administration may be changed at the discretion of home infusion pharmacist based upon assessment of the patient and/or caregiver's ability to self-administer the medication ordered.   docusate sodium  100 MG capsule Commonly known as: Colace Take 1 capsule (100 mg total) by mouth daily as needed (please take daily while on narcotic pain medication).   ertapenem  IVPB Commonly known as: INVANZ  Inject 1 g into the vein daily for 28 days. Indication:  Psoas abscess First Dose: Yes Last Day of Therapy:  04/10/24 Labs - Once weekly:  CBC/D and BMP, Labs - Once weekly: ESR and CRP Method of administration: Mini-Bag Plus / Gravity Method of administration may be changed at the discretion of home infusion pharmacist based upon assessment of the patient and/or caregiver's ability to self-administer the medication ordered.   feeding supplement Liqd Take 237 mLs by mouth 2 (two) times daily between meals.   flecainide  50 MG tablet Commonly known as: TAMBOCOR  Take 1 tablet (50 mg total) by mouth 2 (two) times daily. What changed:  medication strength how much  to take   fluticasone  50 MCG/ACT nasal spray Commonly known as: FLONASE  Place 2 sprays into both nostrils daily. What changed:  when to take this reasons to take this   folic acid  1 MG tablet Commonly known as: FOLVITE  Take 1 tablet (1 mg total) by mouth daily.   gabapentin  300 MG capsule Commonly known as: NEURONTIN  Take 1-2 capsules (300-600 mg total) by mouth See admin instructions. Take 300 mg by mouth in the morning & at noon and 600 mg at bedtime   hydrALAZINE  25 MG tablet Commonly known as: APRESOLINE  TAKE 1 TABLET (25 MG TOTAL) BY MOUTH 4 (FOUR) TIMES DAILY. What changed: when to take  this   hydrocortisone  cream 1 % Apply topically 3 (three) times daily.   montelukast  10 MG tablet Commonly known as: SINGULAIR  Take 10 mg by mouth at bedtime.   multivitamin with minerals Tabs tablet Take 1 tablet by mouth daily. Start taking on: March 22, 2024   oxybutynin  5 MG tablet Commonly known as: DITROPAN  Take 1 tablet (5 mg total) by mouth every 8 (eight) hours as needed for bladder spasms.   Restasis  0.05 % ophthalmic emulsion Generic drug: cycloSPORINE  Place 1 drop into both eyes in the morning and at bedtime.   sodium chloride  flush 0.9 % Soln Commonly known as: NS 10-40 mLs by Intracatheter route as needed (flush).   sodium chloride  flush 0.9 % Soln Commonly known as: NS Inject 3 mLs into the vein every 12 (twelve) hours.   tamsulosin  0.4 MG Caps capsule Commonly known as: FLOMAX  Take 1 capsule (0.4 mg total) by mouth daily after supper.   torsemide  10 MG tablet Commonly known as: DEMADEX  Take 1 tablet (10 mg total) by mouth 2 (two) times daily. What changed:  medication strength how much to take when to take this               Home Infusion Instuctions  (From admission, onward)           Start     Ordered   02/28/24 0000  Home infusion instructions       Question:  Instructions  Answer:  Flushing of vascular access device: 0.9% NaCl  pre/post medication administration and prn patency; Heparin  100 u/ml, 5ml for implanted ports and Heparin  10u/ml, 5ml for all other central venous catheters.   02/28/24 1444              Discharge Care Instructions  (From admission, onward)           Start     Ordered   03/21/24 0000  Change dressing on IV access line weekly and PRN  (Home infusion instructions - [facility] )        03/21/24 1215            Follow-up Information     Theophilus Andrews, Tully GRADE, MD. Schedule an appointment as soon as possible for a visit.   Specialty: Internal Medicine Why: after discharge from rehab Contact information: [address] [place] [address] [phone]         [place]ton Constance DASEN, MD Follow up on 03/17/2024.   Specialty: Infectious Diseases Why: 11 AM Contact information: [address] [place] [address] [phone]         [facility]. Schedule an appointment as soon as possible for a visit.   Contact information: [address] [place] [address] [place]  [address] [phone]               Allergies[1]  Consultations: Nephrology, GI, infectious disease, psychiatry    Procedures/Studies: DG Lumbar Spine 2-3 Views Result Date: 03/19/2024 CLINICAL DATA:  Elective surgery. History of recent falls. Numbness of legs. Recent surgery. EXAM: LUMBAR SPINE - 2-3 VIEW COMPARISON:  Lumbar spine CT 02/22/2024, radiograph 02/06/2024 FINDINGS: Technically limited due to patient's difficulty with positioning. S1 is transitional. The lateral views are significantly oblique. Posterior rod and pedicle screw fixation at L4-L5. The hardware is grossly intact. Interbody spacer in place. No evidence of acute fracture. Diffuse degenerative disc disease with multilevel facet hypertrophy, better delineated on prior CT. IMPRESSION: 1. Technically limited  exam due to patient's difficulty with positioning. 2. Posterior rod and  pedicle screw fixation at L4-L5. No evidence of hardware complication. 3. No evidence of acute fracture, although technically limited by positioning. 4. Multilevel degenerative disc disease and facet hypertrophy. Electronically Signed   By: Andrea Gasman M.D.   On: 03/19/2024 18:08   US  Abdomen Limited RUQ (LIVER/GB) Result Date: 03/12/2024 CLINICAL DATA:  Transaminitis EXAM: ULTRASOUND ABDOMEN LIMITED RIGHT UPPER QUADRANT COMPARISON:  CT 11/16/2021 FINDINGS: Gallbladder: Surgically absent Common bile duct: Diameter: 5.4 mm Liver: Slightly echogenic liver parenchyma. Small cyst in the right hepatic lobe measuring 13 mm. Portal vein is patent on color Doppler imaging with normal direction of blood flow towards the liver. Other: Small right pleural effusion incidentally noted IMPRESSION: 1. Status post cholecystectomy. No biliary dilatation. 2. Slightly echogenic liver parenchyma suggesting hepatic steatosis and or hepatocellular disease. 3. Small right pleural effusion. Electronically Signed   By: Luke Bun M.D.   On: 03/12/2024 15:37   EEG adult Result Date: 03/11/2024 Shelton Arlin KIDD, MD     03/11/2024  3:52 PM Patient Name: Charles Lloyd Charles Lloyd. MRN: [id number] Epilepsy Attending: Arlin KIDD Shelton Referring Physician/Provider: Sebastian Toribio GAILS, MD Date: 03/11/2024 Duration: 24.26 mins Patient history: 85yo M with jerking. EEG to evaluate for seizure Level of alertness: Awake AEDs during EEG study: None Technical aspects: This EEG study was done with scalp electrodes positioned according to the 10-20 International system of electrode placement. Electrical activity was reviewed with band pass filter of 1-70Hz , sensitivity of 7 uV/mm, display speed of 76mm/sec with a 60Hz  notched filter applied as appropriate. EEG data were recorded continuously and digitally stored.  Video monitoring was available and reviewed as appropriate. Description: EEG showed continuous generalized 3 to 6 Hz theta-delta slowing,  triphasic morphology. Hyperventilation and photic stimulation were not performed.   ABNORMALITY - Continuous slow, generalized IMPRESSION: This study is suggestive of generalized cerebral dysfunction (encephalopathy) likely related to toxic-metabolic etiology. No seizures or epileptiform discharges were seen throughout the recording. Priyanka KIDD Shelton   CT HEAD WO CONTRAST ( ) Result Date: 03/10/2024 CLINICAL DATA:  Delirium acute change EXAM: CT HEAD WITHOUT CONTRAST TECHNIQUE: Contiguous axial images were obtained from the base of the skull through the vertex without intravenous contrast. RADIATION DOSE REDUCTION: This exam was performed according to the departmental dose-optimization program which includes automated exposure control, adjustment of the mA and/or kV according to patient size and/or use of iterative reconstruction technique. COMPARISON:  MRI 02/24/2024, CT brain 02/22/2024 FINDINGS: Brain: No acute territorial infarction, hemorrhage or intracranial mass. Moderate atrophy. Mild chronic small vessel ischemic changes of the white matter. Stable ventricular size Vascular: No hyperdense vessels.  Carotid vascular calcification Skull: Normal. Negative for fracture or focal lesion. Sinuses/Orbits: No acute finding. Other: None IMPRESSION: 1. No CT evidence for acute intracranial abnormality. 2. Atrophy and chronic small vessel ischemic changes of the white matter. Electronically Signed   By: Luke Bun M.D.   On: 03/10/2024 19:34   US  RENAL Result Date: 03/09/2024 CLINICAL DATA:  Acute kidney injury. EXAM: RENAL / URINARY TRACT ULTRASOUND COMPLETE COMPARISON:  12/30/2020 FINDINGS: Right Kidney: Renal measurements: 9.0 x 5.7 x 4.9 cm = volume: 132 mL. Echogenicity within normal limits. No mass or hydronephrosis visualized. Left Kidney: Renal measurements: 10.3 x 5.5 x 4.6 cm = volume: 136 mL. Echogenicity within normal limits. No mass or hydronephrosis visualized. Bladder: Bladder is decompressed  with a Foley catheter. Other: Anechoic structure in the right hepatic lobe near  the right kidney. This is most compatible with a cyst and measures 1.7 x 1.1 x 1.4 cm. IMPRESSION: 1. Normal appearance of both kidneys.  No hydronephrosis. 2. Bladder is decompressed with a Foley catheter. Electronically Signed   By: Juliene Balder M.D.   On: 03/09/2024 13:44   US  EKG SITE RITE Result Date: 02/28/2024 If Site Rite image not attached, placement could not be confirmed due to current cardiac rhythm.  CT GUIDED PERITONEAL/RETROPERITONEAL FLUID DRAIN BY PERC CATH Result Date: 02/24/2024 INDICATION: 84 year old with a postoperative fluid collection in the left psoas muscle. EXAM: CT-guided aspiration of left psoas fluid collection MEDICATIONS: Moderate sedation ANESTHESIA/SEDATION: Moderate (conscious) sedation was employed during this procedure. A total of Versed  1mg  and fentanyl  50 mcg was administered intravenously at the order of the provider performing the procedure. Total intra-service moderate sedation time: 15 minutes. Patient's level of consciousness and vital signs were monitored continuously by radiology nurse throughout the procedure under the supervision of the provider performing the procedure. COMPLICATIONS: None immediate. PROCEDURE: Informed written consent was obtained from the patient after a thorough discussion of the procedural risks, benefits and alternatives. All questions were addressed. A timeout was performed prior to the initiation of the procedure. Patient was placed prone. CT images through the lower abdomen were obtained. Left side of the back was prepped with Betadine and sterile field was created. Skin was anesthetized using 1% lidocaine . Using CT guidance, a Yueh catheter was directed into the left psoas muscle. Initially, the needle was placed at the level of the L4 pedicle screw. However, no fluid could be aspirated. The Yueh catheter was directed more caudal into the anterior aspect of  the left psoas muscle and 1 mL of bloody fluid was aspirated. Bandage placed over the puncture site. RADIATION DOSE REDUCTION: This exam was performed according to the departmental dose-optimization program which includes automated exposure control, adjustment of the mA and/or kV according to patient size and/or use of iterative reconstruction technique. FINDINGS: Bilateral interbody fusion at L4-L5. Needle was directed into the anterior aspect of the left psoas muscle and 1 mL bloody fluid was removed. IMPRESSION: CT-guided aspiration of left psoas fluid collection. Fluid was sent for culture. Electronically Signed   By: Juliene Balder M.D.   On: 02/24/2024 22:02   MR BRAIN WO CONTRAST Result Date: 02/24/2024 EXAM: MRI BRAIN WITHOUT CONTRAST 02/24/2024 04:46:52 PM TECHNIQUE: Multiplanar multisequence MRI of the head/brain was performed without the administration of intravenous contrast. COMPARISON: CT head 02/22/2024 and earlier. CLINICAL HISTORY: Delirium. FINDINGS: BRAIN AND VENTRICLES: Mild generalized parenchymal volume loss. Mild prominence of extra spaces over the cerebral convexities, likely related to volume loss, without evidence of extra-axial fluid collection. White matter within normal limits for patient's age. No acute infarct. No intracranial hemorrhage. No mass. No midline shift. No hydrocephalus. The sella is unremarkable. Normal flow voids. ORBITS: Left lens replacement. SINUSES AND MASTOIDS: Mucosal thickening in the right maxillary sinus. Trace fluid in the mastoid air cells, right greater than left. BONES AND SOFT TISSUES: Normal marrow signal. No acute soft tissue abnormality. IMPRESSION: 1. No acute intracranial abnormality. Electronically signed by: Donnice Mania MD 02/24/2024 06:25 PM EST RP Workstation: [id number]   MR Lumbar Spine W Wo Contrast Result Date: 02/22/2024 EXAM: MRI LUMBAR SPINE 02/22/2024 06:47:20 PM TECHNIQUE: Multiplanar multisequence MRI of the lumbar spine was performed  without or with the administration of intravenous contrast. COMPARISON: MRI lumbar spine 10/30/2023 CLINICAL HISTORY: Low back pain, cauda equina syndrome suspected FINDINGS: SEGMENTATION: Transitional lumbosacral  anatomy. The transitional lumbosacral vertebra is assumed to represent the S1 level. This conforms to the segmentation utilized on prior exams. BONES AND ALIGNMENT: Similar grade 1 retrolisthesis of L2 on L3 and improved grade 1 anterolisthesis of L4 on L5. Interval L4-L5 interbody fusion and PLIF. Hardware artifact limits assessment, but no obviouse bone marrow signal abnormality. SPINAL CORD: The conus terminates normally. SOFT TISSUES: New 3.5 x 2.4 cm fluid collection in the left psoas muscle at the L4-L5 level. More illdefined edema in the posterior paraspinal soft tissues with small fluid pockets is more typical of postoperative change. L1-L2 Disc height loss with degenerative endplate signal changes. Disc bulge with mild to moderate canal stenosis. Patent foramina. L2-L3 Disc bulging and endplate spurring with ligamentum flavum thickening, resulting in progressive severe canal stenosis. Bilateral facet arthropathy with progressive moderate to severe left foraminal stenosis. Similar moderate right foraminal stenosis. L3-L4 Broad disc bulge with ligamentum flavum thickening and bilateral facet arthropathy. Similar versus slightly progressive moderate to severe canal stenosis. Similar moderate left foraminal stenosis. L4-L5 Interbody fusion and PLIF.  Persistent severe canal stenosis. L5-S1 Mild disc bulge with similar borderline mild foraminal stenosis. S1-S2 Transitional anatomy. No spinal canal stenosis or neural foraminal narrowing. IMPRESSION: 1. New 3.5 x 2.4 cm fluid collection in the left psoas muscle at the L4-L5 level with surrounding edema. The location is atypical for postoperative changes and is concerning for abscess. No obvious changes of discitis/osteomyelitis but hardware artifact  limits assessment. Recommend correlation with infectious markers. 2. At L2-L3, progressive severe canal stenosis and moderate to severe left foraminal stenosis. Similar moderate right foraminal stenosis. 3. At L3-L4, similar versus slightly progressive moderate to severe canal stenosis. 4. Interval fusion at L4-L5 with improved alignment but persistent severe canal stenosis. 5. Transitional lumbosacral vertebra is assumed to represent the S1 level. This conforms to the segmentation utilized on prior exams. Electronically signed by: Gilmore Molt MD 02/22/2024 09:12 PM EST RP Workstation: [id number]   CT PELVIS WO CONTRAST Result Date: 02/22/2024 CLINICAL DATA:  Fall in shower. History of frequent falls over the past 6-8 weeks. EXAM: CT PELVIS WITHOUT CONTRAST TECHNIQUE: Multidetector CT imaging of the pelvis was performed following the standard protocol without intravenous contrast. RADIATION DOSE REDUCTION: This exam was performed according to the departmental dose-optimization program which includes automated exposure control, adjustment of the mA and/or kV according to patient size and/or use of iterative reconstruction technique. COMPARISON:  Pelvic radiographs dated 02/22/2024. CT pelvis dated 10/30/2023. FINDINGS: Bones/Joint/Cartilage No acute fracture or dislocation. Femoral heads are seated within the acetabulum. 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 Bilateral greater trochanteric enthesopathy. The sacroiliac joints and pubic symphysis are anatomically aligned with degenerative changes. Partially visualized postoperative changes of the lower lumbar spine. Soft tissue and Muscles Soft tissue swelling along the left posterolateral abdominal wall. No loculated fluid collection. Intrapelvic contents Prostate implant seeds. Descending and sigmoid colonic diverticulosis. Moderate vascular calcifications again noted.  No enlarged lymph nodes identified in the field of view. IMPRESSION: 1. No acute osseous abnormality. 2. Soft tissue swelling along the left posterolateral abdominal wall. No fluid collection. 3. 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 Electronically Signed   By: Harrietta Sherry M.D.   On: 02/22/2024 16:13   CT Lumbar Spine Wo Contrast Result Date: 02/22/2024 EXAM: CT OF THE LUMBAR SPINE WITHOUT CONTRAST 02/22/2024 03:28:05 PM TECHNIQUE: CT of the lumbar spine was performed without the administration of intravenous contrast. Multiplanar reformatted  images are provided for review. Automated exposure control, iterative reconstruction, and/or weight based adjustment of the mA/kV was utilized to reduce the radiation dose to as low as reasonably achievable. COMPARISON: MRI and CT lumbar spine 10/30/2023. CLINICAL HISTORY: Back trauma, no prior imaging (Age >= 16y). FINDINGS: BONES AND ALIGNMENT: Normal vertebral body heights. No evidence of compression fracture or displaced fracture in the lumbar spine. Redemonstrated transitional anatomy at the lumbosacral junction with a partially lumbarized S1 vertebral body and a near full size disc at the S1-S2 level. Interval PLIF at L4-L5 with bilateral pedicle screws and vertical interconnecting rods. Hardware is intact. Interbody spacer at L4-L5. There is no osseous fusion across the L4-L5 disc space on the current study. Diffuse osteopenia. Degenerative changes of the sacroiliac joints. Alignment: Trace degenerative retrolisthesis of L1 on L2 and L2 on L3. Similar grade 1 anterolisthesis of L4 on L5. DEGENERATIVE CHANGES: Disc space narrowing and vacuum disc phenomenon at multiple levels. Degenerative endplate osteophytes at multiple levels. Posterior osteophytes along with facet arthrosis at L2-L3 contributing to moderate to severe spinal canal stenosis. Additional disc bulge and facet arthrosis at L3-L4 resulting in at least moderate spinal  canal stenosis, although streak artifact slightly limits evaluation. Streak artifact limits evaluation of the spinal canal at L4-L5; there is likely residual moderate to severe spinal canal stenosis at this level. SOFT TISSUES: Atherosclerosis of the abdominal aorta and branch vessels. Additional prominent atherosclerosis within the pelvis. Cholecystectomy clips. Diverticulosis of the partially visualized sigmoid colon. IMPRESSION: 1. No evidence of acute traumatic injury. 2. Interval PLIF at L4-L5. Hardware is intact. No osseous fusion across the L4-L5 disc space. 3. 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 4. At least moderate spinal canal stenosis at L3-L4 due to disc bulge and facet arthrosis. 5. Likely residual moderate to severe spinal canal stenosis at L4-L5, evaluation limited by streak artifact. Electronically signed by: Donnice Mania MD 02/22/2024 04:04 PM EST RP Workstation: [id number]   DG Pelvis 1-2 Views Result Date: 02/22/2024 EXAM: 1 or 2 VIEW(S) XRAY OF THE PELVIS 02/22/2024 01:09:00 PM COMPARISON: None available. CLINICAL HISTORY: [id number] Fall [id number] FINDINGS: BONES AND JOINTS: No acute fracture. No malalignment. Lumbar spinal fusion hardware noted. Moderate bilateral hip degenerative changes. SOFT TISSUES: Surgical clips overlie the pelvis. Vascular calcifications noted. IMPRESSION: 1. No evidence of acute traumatic injury. Electronically signed by: Morgane Naveau MD 02/22/2024 01:32 PM EST RP Workstation: [id number]   DG Chest 1 View Result Date: 02/22/2024 EXAM: 1 VIEW(S) XRAY OF THE CHEST 02/22/2024 01:09:00 PM COMPARISON: 10/19/2022 CLINICAL HISTORY: Fall FINDINGS: LUNGS AND PLEURA: No focal pulmonary opacity. No pleural effusion. No pneumothorax. HEART AND MEDIASTINUM: Calcified aorta. BONES AND SOFT TISSUES: Thoracic degenerative changes. No acute osseous abnormality. IMPRESSION: 1. No acute process. Electronically signed by:  Morgane Naveau MD 02/22/2024 01:31 PM EST RP Workstation: [id number]   CT Cervical Spine Wo Contrast Result Date: 02/22/2024 EXAM: CT CERVICAL SPINE WITHOUT CONTRAST 02/22/2024 12:58:30 PM TECHNIQUE: CT of the cervical spine was performed without the administration of intravenous contrast. Multiplanar reformatted images are provided for review. Automated exposure control, iterative reconstruction, and/or weight based adjustment of the mA/kV was utilized to reduce the radiation dose to as low as reasonably achievable. COMPARISON: 10/30/2023 and 12/30/2020 CLINICAL HISTORY: Neck trauma (Age >= 65y) FINDINGS: BONES AND ALIGNMENT: No acute fracture or traumatic malalignment. There is straightening of the normal cervical lordosis. There is 3 mm anterolisthesis of C7 on T1 which is increased by  approximately 1 mm since the 2022 study. Additional 4 mm anterolisthesis of T1 on T2 which is increased by approximately 1.5 x 2 mm since the 2022 study. DEGENERATIVE CHANGES: There is moderate to severe disc space narrowing at multiple levels in the cervical spine most pronounced at C5-C6 and C6-C7 overall similar to prior. There is severe disc space narrowing at T1-T2 which has increased from prior. Degenerative endplate osteophytes at multiple levels. Disc osteophyte complexes at multiple levels without high grade osseous spinal canal stenosis. There is facet arthrosis and uncovertebral hypertrophy at multiple levels. SOFT TISSUES: No prevertebral soft tissue swelling. IMPRESSION: 1. No acute cervical spine fracture. 2. 3 mm anterolisthesis of C7 on T1, increased from prior. 4 mm anterolisthesis of T1 on T2, increased since 2022. Findings likely related to increased degenerative changes. Electronically signed by: Donnice Mania MD 02/22/2024 01:21 PM EST RP Workstation: [id number]   CT Head Wo Contrast Result Date: 02/22/2024 EXAM: CT HEAD WITHOUT CONTRAST 02/22/2024 12:58:30 PM TECHNIQUE: CT of the head was performed  without the administration of intravenous contrast. Automated exposure control, iterative reconstruction, and/or weight based adjustment of the mA/kV was utilized to reduce the radiation dose to as low as reasonably achievable. COMPARISON: 10/30/2023 CLINICAL HISTORY: Head trauma, minor (Age >= 65y) FINDINGS: BRAIN AND VENTRICLES: No acute hemorrhage. No evidence of acute infarct. No hydrocephalus. No extra-axial collection. No mass effect or midline shift. Patchy and confluent decreased attenuation throughout the deep and periventricular white matter of the cerebral hemispheres bilaterally, compatible with chronic microvascular ischemic disease. Cerebral ventricle sizes concordant with degree of cerebral volume loss. Atherosclerotic calcifications within the cavernous internal carotid arteries. ORBITS: No acute abnormality. Left lens replacement. SINUSES: No acute abnormality. SOFT TISSUES AND SKULL: No acute soft tissue abnormality. No skull fracture. IMPRESSION: 1. No acute intracranial abnormality. Electronically signed by: Donnice Mania MD 02/22/2024 01:14 PM EST RP Workstation: [id number]     Subjective: No acute issues/events reported overnight   Discharge Exam: Vitals:   03/21/24 0520 03/21/24 0900  BP: (!) 160/61   Pulse: 94   Resp: 18   Temp: 98.5 F (36.9 C) (!) 101.4 F (38.6 C)  SpO2: 94%    Vitals:   03/20/24 1330 03/20/24 2006 03/21/24 0520 03/21/24 0900  BP: 123/66 139/69 (!) 160/61   Pulse: 82 87 94   Resp: 20 18 18    Temp: 98.3 F (36.8 C) 98.5 F (36.9 C) 98.5 F (36.9 C) (!) 101.4 F (38.6 C)  TempSrc: Oral Oral Oral   SpO2: 91% 95% 94%   Weight:      Height:        General:  Pleasantly resting in bed, No acute distress.  Notable stutter and word finding difficulty HEENT:  Normocephalic atraumatic.  Sclerae nonicteric, noninjected.  Extraocular movements intact bilaterally. Neck:  Without mass or deformity.  Trachea is midline. Lungs:  Clear to auscultate  bilaterally without rhonchi, wheeze, or rales. Heart:  Regular rate and rhythm.  Without murmurs, rubs, or gallops. Abdomen:  Soft, nontender, nondistended.  Without guarding or rebound. Extremities: Without cyanosis, or edema   The results of significant diagnostics from this hospitalization (including imaging, microbiology, ancillary and laboratory) are listed below for reference.     Microbiology: Recent Results (from the past 240 hours)  MRSA Next Gen by PCR, Nasal     Status: None   Collection Time: 03/11/24  5:49 PM   Specimen: Nasal Mucosa; Nasal Swab  Result Value Ref Range Status   MRSA by  PCR Next Gen NOT DETECTED NOT DETECTED Final    Comment: (NOTE) The GeneXpert MRSA Assay (FDA approved for NASAL specimens only), is one component of a comprehensive MRSA colonization surveillance program. It is not intended to diagnose MRSA infection nor to guide or monitor treatment for MRSA infections. Test performance is not FDA approved in patients less than 81 years old. Performed at [facility], [address], [place], [place] [address]      Labs: BNP (last 3 results) No results for input(s): BNP in the last 8760 hours. Basic Metabolic Panel: Recent Labs  Lab 03/16/24 0149 03/17/24 0049 03/18/24 0940 03/20/24 0540 03/21/24 0833  NA 142 141 145 145 146*  K 3.5 3.3* 3.7 3.6 3.6  CL 111 110 113* 113* 113*  CO2 21* 23 22 22  21*  GLUCOSE 104* 125* 96 73 78  BUN 24* 22 21 19 19   CREATININE 1.73* 1.57* 1.44* 1.59* 1.64*  CALCIUM  9.0 9.3 9.4 9.0 9.3   Liver Function Tests: Recent Labs  Lab 03/16/24 0149 03/17/24 0049 03/18/24 0940 03/20/24 0540 03/21/24 0833  AST 305* 210* 147* 80* 61*  ALT 173* 162* 147* 94* 84*  ALKPHOS 86 80 101 74 82  BILITOT 0.5 0.5 0.5 0.5 0.7  PROT 5.1* 4.7* 5.0* 4.9* 5.3*  ALBUMIN  2.4* 2.6* 2.5* 2.3* 2.6*   No results for input(s): LIPASE, AMYLASE in the last 168 hours. Recent Labs  Lab 03/16/24 2104   AMMONIA 19   CBC: Recent Labs  Lab 03/15/24 0346 03/16/24 0149 03/17/24 0049 03/18/24 0940  WBC 4.7 5.2 3.7* 3.7*  NEUTROABS  --   --   --  1.6*  HGB 7.8* 8.0* 7.6* 8.1*  HCT 24.1* 25.0* 24.3* 26.0*  MCV 90.3 91.6 91.7 93.2  PLT 185 214 184 208   Cardiac Enzymes: Recent Labs  Lab 03/16/24 0149 03/18/24 0940 03/20/24 0540  CKTOTAL 1,065* 209 82   BNP: Invalid input(s): POCBNP CBG: Recent Labs  Lab 03/20/24 1139 03/20/24 1630 03/20/24 2316 03/21/24 0808 03/21/24 1140  GLUCAP 72 78 82 72 69*   D-Dimer No results for input(s): DDIMER in the last 72 hours. Hgb A1c No results for input(s): HGBA1C in the last 72 hours. Lipid Profile No results for input(s): CHOL, HDL, LDLCALC, TRIG, CHOLHDL, LDLDIRECT in the last 72 hours. Thyroid  function studies No results for input(s): TSH, T4TOTAL, T3FREE, THYROIDAB in the last 72 hours.  Invalid input(s): FREET3 Anemia work up No results for input(s): VITAMINB12, FOLATE, FERRITIN, TIBC, IRON, RETICCTPCT in the last 72 hours. Urinalysis    Component Value Date/Time   COLORURINE RED (A) 03/10/2024 1426   APPEARANCEUR (A) 03/10/2024 1426    TEST NOT REPORTED DUE TO COLOR INTERFERENCE OF URINE PIGMENT   LABSPEC  03/10/2024 1426    TEST NOT REPORTED DUE TO COLOR INTERFERENCE OF URINE PIGMENT   PHURINE  03/10/2024 1426    TEST NOT REPORTED DUE TO COLOR INTERFERENCE OF URINE PIGMENT   GLUCOSEU (A) 03/10/2024 1426    TEST NOT REPORTED DUE TO COLOR INTERFERENCE OF URINE PIGMENT   HGBUR (A) 03/10/2024 1426    TEST NOT REPORTED DUE TO COLOR INTERFERENCE OF URINE PIGMENT   BILIRUBINUR (A) 03/10/2024 1426    TEST NOT REPORTED DUE TO COLOR INTERFERENCE OF URINE PIGMENT   BILIRUBINUR 1+ 07/12/2022 1120   KETONESUR (A) 03/10/2024 1426    TEST NOT REPORTED DUE TO COLOR INTERFERENCE OF URINE PIGMENT   PROTEINUR (A) 03/10/2024 1426    TEST  NOT REPORTED DUE TO COLOR INTERFERENCE OF URINE PIGMENT    UROBILINOGEN negative (A) 07/12/2022 1120   NITRITE (A) 03/10/2024 1426    TEST NOT REPORTED DUE TO COLOR INTERFERENCE OF URINE PIGMENT   LEUKOCYTESUR (A) 03/10/2024 1426    TEST NOT REPORTED DUE TO COLOR INTERFERENCE OF URINE PIGMENT   Sepsis Labs Recent Labs  Lab 03/15/24 0346 03/16/24 0149 03/17/24 0049 03/18/24 0940  WBC 4.7 5.2 3.7* 3.7*   Microbiology Recent Results (from the past 240 hours)  MRSA Next Gen by PCR, Nasal     Status: None   Collection Time: 03/11/24  5:49 PM   Specimen: Nasal Mucosa; Nasal Swab  Result Value Ref Range Status   MRSA by PCR Next Gen NOT DETECTED NOT DETECTED Final    Comment: (NOTE) The GeneXpert MRSA Assay (FDA approved for NASAL specimens only), is one component of a comprehensive MRSA colonization surveillance program. It is not intended to diagnose MRSA infection nor to guide or monitor treatment for MRSA infections. Test performance is not FDA approved in patients less than 30 years old. Performed at [facility], [address] [place], [place] [address]      Time coordinating discharge: Over 30 minutes  SIGNED:   Elsie JAYSON Montclair, DO [facility] 03/21/2024, 12:15 PM Pager   If 7PM-7AM, please contact night-coverage [email or web address]     [1]  Allergies Allergen Reactions   Amlodipine  Nausea Only, Swelling and Other (See Comments)    Severe edema and chest pain- also   Chlorhexidine  Gluconate Itching and Other (See Comments)    Only itching with wipes, not with soap   Ativan  [Lorazepam ] Other (See Comments)    Agitation/hallucination - not a true allergy

## 2024-03-21 NOTE — Plan of Care (Signed)
" °  Problem: Fluid Volume: Goal: Ability to maintain a balanced intake and output will improve Outcome: Progressing   Problem: Health Behavior/Discharge Planning: Goal: Ability to identify and utilize available resources and services will improve Outcome: Progressing Goal: Ability to manage health-related needs will improve Outcome: Progressing   Problem: Skin Integrity: Goal: Risk for impaired skin integrity will decrease Outcome: Progressing   Problem: Tissue Perfusion: Goal: Adequacy of tissue perfusion will improve Outcome: Progressing   Problem: Health Behavior/Discharge Planning: Goal: Ability to manage health-related needs will improve Outcome: Progressing   "

## 2024-03-21 NOTE — TOC Progression Note (Addendum)
 Transition of Care (TOC) - Progression Note    Patient Details  Name: Charles Lloyd. MRN: [id number] Date of Birth: 10-16-1940  Transition of Care [facility]) CM/SW Contact  Heather DELENA Saltness, LCSW Phone Number: 03/21/2024, 9:33 AM  Clinical Narrative:    Pt's insurance authorization for short-term SNF rehab at [facility] has been approved. Auth ID: [id number]. CSW attempted to speak with admissions director via phone call at [phone]. No answer, unable to leave voicemail. Insurance shara is valid from 1/24-1/27. TOC will continue to follow.   Expected Discharge Plan: Skilled Nursing Facility Barriers to Discharge: Continued Medical Work up   Expected Discharge Plan and Services In-house Referral: NA Discharge Planning Services: CM Consult Post Acute Care Choice: Skilled Nursing Facility Living arrangements for the past 2 months: Single Family Home Expected Discharge Date: 03/01/24                   Social Drivers of Health (SDOH) Interventions SDOH Screenings   Food Insecurity: No Food Insecurity (02/23/2024)  Housing: Low Risk (02/23/2024)  Transportation Needs: No Transportation Needs (02/23/2024)  Utilities: Not At Risk (02/23/2024)  Alcohol  Screen: Low Risk (12/21/2022)  Depression (PHQ2-9): Low Risk (02/11/2024)  Financial Resource Strain: Low Risk (12/21/2022)  Physical Activity: Inactive (12/21/2022)  Social Connections: Socially Isolated (02/23/2024)  Stress: No Stress Concern Present (12/21/2022)  Tobacco Use: Low Risk (02/22/2024)  Health Literacy: Adequate Health Literacy (12/21/2022)    Readmission Risk Interventions    02/26/2024    2:28 PM 08/08/2021   10:00 AM  Readmission Risk Prevention Plan  Transportation Screening Complete Complete  PCP or Specialist Appt within 3-5 Days Complete   HRI or Home Care Consult Complete   Social Work Consult for Recovery Care Planning/Counseling Complete   Palliative Care Screening Not Applicable   Medication  Review Oceanographer) Complete Complete  PCP or Specialist appointment within 3-5 days of discharge  Complete  HRI or Home Care Consult  Complete  SW Recovery Care/Counseling Consult  Not Complete  Palliative Care Screening  Not Applicable  Skilled Nursing Facility  Not Applicable   Signed: Heather Saltness, MSW, LCSW Clinical Social Worker Inpatient Care Management 03/21/2024 9:34 AM

## 2024-03-21 NOTE — Progress Notes (Signed)
 " PROGRESS NOTE    Charles Lloyd Charles Lloyd.  FMW:[id number] DOB: [birth year] DOA: 02/22/2024 PCP: Theophilus Andrews, Tully GRADE, MD   Brief Narrative:  [phone] with h/o HTN, HLD, T2DM, afib on Eliquis , and L4-5 spondylolisthesis s/p fusion on 12/11 who presented with LLE weakness and pain with falls. He was found to have a 3.5 x 2.4 cm L psoas muscle abscess. Neurosurgery recommended IR drainage and IV antibiotics. Underwent CT-guided aspiration on 02/24/24. Developed hospital-associated delirium requiring ICU transfer, now improved. Currently on antibiotics through 04/10/2024 as per ID recommendations. Psychiatry consulted on 03/06/2024 for intermittent agitation and as per family request. Palliative care also consulted for goals of care discussion.  Patient remains medically stable for discharge at this point - mental status (while waxing and waning) has continued to slowly improve over the past few days. Will likely need prolonged therapy in regards to speech and physical/occupational - plan to discharge to SNF once facility is chosen, bed is available, and insurance has authorized.  Assessment & Plan:   Principal Problem:   Psoas abscess, left (HCC) Active Problems:   Paroxysmal A-fib (HCC)   Chronic heart failure with preserved ejection fraction (HFpEF) (HCC)   NIDDM-2 with polyneuropathy and hyperglycemia   Hypercholesterolemia   Hyponatremia   Asthma   Essential hypertension   Recurrent falls   Altered mental status   Alcohol  withdrawal syndrome, with delirium (HCC)   Delirium   Pressure injury   Acute on chronic urinary retention   Stress reaction causing mixed disturbance of emotion and conduct   Peripheral neuropathy   Left psoas abscess, POA Sepsis POA in the setting of tachycardia fever and infection - Patient noted to have had recent transverse lumbar interbody fusion on 02/06/2024. - Patient seen in consultation by IR and patient underwent CT-guided aspiration of left psoas fluid  collection with drain placement. - Cultures remain negative - Infectious disease following, transition back transition back to daptomycin  given resolution of CK, add ertapenem  through 04/10/2024    Acute metabolic encephalopathy/delirium/alcohol  withdrawal syndrome with delirium/possible underlying dementia Cannot rule out polypharmacy - Patient baseline sharp as attack per son - Initial agitation likely in the setting of infection - Subsequent medications including benzodiazepine, Precedex , phenobarbital  with no real improvement in patient's mental status and ongoing agitation -given age polypharmacy is of concern - Subsequent renal dysfunction and liver dysfunction in the setting of multiple medications and infection likely contributing as well - MRI 02/24/2024 negative, repeat CT head on 03/10/2024 negative for acute process - Labs wholly unremarkable including ammonia, B12, TSH,, TSH, - EEG unrevealing -consistent with metabolic/toxic etiology - Psychiatry consulted per family request 03/06/2024 -initiated on Rozerem  with subsequent somnolence somnolence somnolence and stupor with questionable tremors and poor p.o. intake -subsequently discontinued - Long longstanding gabapentin  also discontinued in this timeframe - Continue to follow patient clinically given ongoing improvement, although mild, over the past few days -continue advance diet as tolerated  Acute kidney injury, resolving Hypovolemic hyponatremia, resolved Metabolic acidosis - Likely multifactorial in setting of poor p.o. 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 intake and IV fluids - ANA, ANCA -within normal limits, kappa lambda light chains noted to be mildly elevated but ratio within normal limits - Nephrology was following but have signed off.  - Bicarb tablets discontinued.   Elevated LFTs, resolving -Likely  multifactorial similar to a late AKI as above in the setting of poor p.o. intake, hypovolemia, hypotension and  medication injury - Hepatitis panel negative other than reactive hepatitis C antibody(false positive given subsequent confirmatory tests negative) - Imaging unremarkable other than other than hepatic steatosis and slightly echogenic liver, nonspecific - Statin discontinued - Restart daptomycin  given discussion with ID, vancomycin  discontinued - GI previously consulted with concern for drug-induced liver injury    Paroxysmal atrial fibrillation - Continue Coreg  and flecainide  for rate control. - Eliquis  for anticoagulation.  Chronic diastolic heart failure/hypertension/hyperlipidemia - Continue hydralazine , Coreg . 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  - Continue to hold gabapentin  secondary to somnolence/drowsiness. - Continue SSI, hypoglycemic protocol   Asthma -Stable, not in acute exacerbation. - Montelukast .   Recurrent falls -PT/OT. - Tentative plan for discharge to SNF   Acute on chronic urinary retention -Patient noted to have failed voiding trial on 03/04/2024 and Foley catheter had to be placed on 03/04/2024. -Continue Flomax . - Plan to discharge with Foley catheter in place with outpatient follow-up with urology.   Moderate malnutrition - advance diet as tolerated Anemia of chronic disease - Hemoglobin stable   Pressure injury, POA Wound 02/24/24 1245 Pressure Injury Buttocks Mid;Lower Deep Tissue Pressure Injury - Purple or maroon localized area of discolored intact skin or blood-filled blister due to damage of underlying soft tissue from pressure and/or shear.  (Active)     Wound 02/24/24 1245 Pressure Injury Scrotum Circumferential Deep Tissue Pressure Injury - Purple or maroon localized area of discolored intact skin or blood-filled blister due to damage of underlying soft tissue from pressure and/or shear. (Active)      DVT prophylaxis: apixaban  (ELIQUIS ) tablet 2.5 mg Start: 03/20/24 1000 SCDs Start: 02/23/24 0010 apixaban  (ELIQUIS ) tablet 2.5 mg   Code Status:   Code Status: Do not attempt resuscitation (DNR) PRE-ARREST INTERVENTIONS DESIRED  Family Communication: Son updated over the phone  Status is: Inpatient  Dispo: The patient is from: Home              Anticipated d/c is to: Facility              Anticipated d/c date is: 24-48              Patient currently hours not medically stable for discharge  Consultants:  Nephrology, GI, infectious disease, psychiatry  Antimicrobials:  Continue above daptomycin  and ertapenem  through 04/10/2024 per ID  Subjective: No acute issues or events overnight patient's review of systems is somewhat limited given difficulty with speech shakes his head no to pain  Objective: Vitals:   03/20/24 0340 03/20/24 1330 03/20/24 2006 03/21/24 0520  BP: 123/71 123/66 139/69 (!) 160/61  Pulse: 86 82 87 94  Resp: 18 20 18 18   Temp: 97.9 F (36.6 C) 98.3 F (36.8 C) 98.5 F (36.9 C) 98.5 F (36.9 C)  TempSrc: Oral Oral Oral Oral  SpO2: 91% 91% 95% 94%  Weight:      Height:        Intake/Output Summary (Last 24 hours) at 03/21/2024 0724 Last data filed at 03/21/2024 0600 Gross per 24 hour  Intake 1124 ml  Output 1500 ml  Net -376 ml   Filed Weights   02/22/24 1238 02/24/24 1245 03/19/24 0540  Weight: 79.4 kg 79.3 kg 79.3 kg  Examination:  General:  Pleasantly resting in bed, No acute distress.  Notable stutter and word finding difficulty HEENT:  Normocephalic atraumatic.  Sclerae nonicteric, noninjected.  Extraocular movements intact bilaterally. Neck:  Without mass or deformity.   Trachea is midline. Lungs:  Clear to auscultate bilaterally without rhonchi, wheeze, or rales. Heart:  Regular rate and rhythm.  Without murmurs, rubs, or gallops. Abdomen:  Soft, nontender, nondistended.  Without guarding or rebound. Extremities: Without cyanosis, or edema   Data Reviewed: I have personally reviewed following labs and imaging studies  CBC: Recent Labs  Lab 03/15/24 0346 03/16/24 0149 03/17/24 0049 03/18/24 0940  WBC 4.7 5.2 3.7* 3.7*  NEUTROABS  --   --   --  1.6*  HGB 7.8* 8.0* 7.6* 8.1*  HCT 24.1* 25.0* 24.3* 26.0*  MCV 90.3 91.6 91.7 93.2  PLT 185 214 184 208   Basic Metabolic Panel: Recent Labs  Lab 03/15/24 0346 03/16/24 0149 03/17/24 0049 03/18/24 0940 03/20/24 0540  NA 141 142 141 145 145  K 3.6 3.5 3.3* 3.7 3.6  CL 110 111 110 113* 113*  CO2 23 21* 23 22 22   GLUCOSE 112* 104* 125* 96 73  BUN 25* 24* 22 21 19   CREATININE 1.59* 1.73* 1.57* 1.44* 1.59*  CALCIUM  9.0 9.0 9.3 9.4 9.0   GFR: Estimated Creatinine Clearance: 35.3 mL/min (A) (by C-G formula based on SCr of 1.59 mg/dL (H)). Liver Function Tests: Recent Labs  Lab 03/15/24 0346 03/16/24 0149 03/17/24 0049 03/18/24 0940 03/20/24 0540  AST 441* 305* 210* 147* 80*  ALT 192* 173* 162* 147* 94*  ALKPHOS 84 86 80 101 74  BILITOT 0.4 0.5 0.5 0.5 0.5  PROT 4.7* 5.1* 4.7* 5.0* 4.9*  ALBUMIN  2.4* 2.4* 2.6* 2.5* 2.3*   No results for input(s): LIPASE, AMYLASE in the last 168 hours. Recent Labs  Lab 03/16/24 2104  AMMONIA 19   Coagulation Profile: No results for input(s): INR, PROTIME in the last 168 hours. Cardiac Enzymes: Recent Labs  Lab 03/16/24 0149 03/18/24 0940 03/20/24 0540  CKTOTAL 1,065* 209 82   CBG: Recent Labs  Lab 03/19/24 2154 03/20/24 0738 03/20/24 1139 03/20/24 1630 03/20/24 2316  GLUCAP 81 69* 72 78 82   Recent Results (from the past 240 hours)  MRSA Next Gen by PCR, Nasal     Status: None   Collection Time: 03/11/24  5:49 PM   Specimen:  Nasal Mucosa; Nasal Swab  Result Value Ref Range Status   MRSA by PCR Next Gen NOT DETECTED NOT DETECTED Final    Comment: (NOTE) The GeneXpert MRSA Assay (FDA approved for NASAL specimens only), is one component of a comprehensive MRSA colonization surveillance program. It is not intended to diagnose MRSA infection nor to guide or monitor treatment for MRSA infections. Test performance is not FDA approved in patients less than 79 years old. Performed at [facility], [address], [place], [place] [address]     Radiology Studies: DG Lumbar Spine 2-3 Views Result Date: 03/19/2024 CLINICAL DATA:  Elective surgery. History of recent falls. Numbness of legs. Recent surgery. EXAM: LUMBAR SPINE - 2-3 VIEW COMPARISON:  Lumbar spine CT 02/22/2024, radiograph 02/06/2024 FINDINGS: Technically limited due to patient's difficulty with positioning. S1 is transitional. The lateral views are significantly oblique. Posterior rod and pedicle screw fixation at L4-L5. The hardware is grossly intact. Interbody spacer in place. No evidence of acute fracture. Diffuse degenerative disc disease with multilevel facet hypertrophy, better delineated on prior CT. IMPRESSION:  1. Technically limited exam due to patient's difficulty with positioning. 2. Posterior rod and pedicle screw fixation at L4-L5. No evidence of hardware complication. 3. No evidence of acute fracture, although technically limited by positioning. 4. Multilevel degenerative disc disease and facet hypertrophy. Electronically Signed   By: Andrea Gasman M.D.   On: 03/19/2024 18:08    Scheduled Meds:  allopurinol   100 mg Oral Daily   apixaban   2.5 mg Oral BID   carvedilol   3.125 mg Oral BID   Chlorhexidine  Gluconate Cloth  6 each Topical Daily   cycloSPORINE   1 drop Both Eyes Daily   feeding supplement  237 mL Oral BID BM   flecainide   50 mg Oral BID   folic acid   1 mg Oral Daily   hydrALAZINE   25 mg Oral BID   hydrocortisone   cream   Topical TID   insulin  aspart  0-6 Units Subcutaneous TID AC & HS   montelukast   10 mg Oral QHS   multivitamin with minerals  1 tablet Oral Daily   senna-docusate  1 tablet Oral QHS   sodium chloride  flush  3 mL Intravenous Q12H   tamsulosin   0.4 mg Oral QPC supper   thiamine   100 mg Oral Daily   Continuous Infusions:  DAPTOmycin  600 mg (03/20/24 1443)   ertapenem  1 g (03/20/24 1337)     LOS: 28 days   Time spent:  Charles Lloyd [place], DO [facility]  If 7PM-7AM, please contact night-coverage [email or web address]  03/21/2024, 7:24 AM      "

## 2024-03-21 NOTE — Plan of Care (Signed)
  Problem: Education: Goal: Ability to describe self-care measures that may prevent or decrease complications (Diabetes Survival Skills Education) will improve Outcome: Progressing Goal: Individualized Educational Video(s) Outcome: Progressing   Problem: Coping: Goal: Ability to adjust to condition or change in health will improve Outcome: Progressing   Problem: Fluid Volume: Goal: Ability to maintain a balanced intake and output will improve Outcome: Progressing   Problem: Health Behavior/Discharge Planning: Goal: Ability to identify and utilize available resources and services will improve Outcome: Progressing Goal: Ability to manage health-related needs will improve Outcome: Progressing   Problem: Nutritional: Goal: Maintenance of adequate nutrition will improve Outcome: Progressing Goal: Progress toward achieving an optimal weight will improve Outcome: Progressing   Problem: Tissue Perfusion: Goal: Adequacy of tissue perfusion will improve Outcome: Progressing

## 2024-03-22 DIAGNOSIS — K6812 Psoas muscle abscess: Secondary | ICD-10-CM | POA: Diagnosis not present

## 2024-03-22 LAB — GLUCOSE, CAPILLARY
Glucose-Capillary: 82 mg/dL (ref 70–99)
Glucose-Capillary: 91 mg/dL (ref 70–99)
Glucose-Capillary: 103 mg/dL — ABNORMAL HIGH (ref 70–99)
Glucose-Capillary: 93 mg/dL (ref 70–99)

## 2024-03-22 NOTE — Plan of Care (Signed)
  Problem: Education: Goal: Ability to describe self-care measures that may prevent or decrease complications (Diabetes Survival Skills Education) will improve Outcome: Progressing Goal: Individualized Educational Video(s) Outcome: Progressing   Problem: Coping: Goal: Ability to adjust to condition or change in health will improve Outcome: Progressing   Problem: Fluid Volume: Goal: Ability to maintain a balanced intake and output will improve Outcome: Progressing   Problem: Health Behavior/Discharge Planning: Goal: Ability to identify and utilize available resources and services will improve Outcome: Progressing Goal: Ability to manage health-related needs will improve Outcome: Progressing   Problem: Metabolic: Goal: Ability to maintain appropriate glucose levels will improve Outcome: Progressing   Problem: Nutritional: Goal: Maintenance of adequate nutrition will improve Outcome: Progressing Goal: Progress toward achieving an optimal weight will improve Outcome: Progressing   Problem: Skin Integrity: Goal: Risk for impaired skin integrity will decrease Outcome: Progressing   Problem: Tissue Perfusion: Goal: Adequacy of tissue perfusion will improve Outcome: Progressing   Problem: Education: Goal: Knowledge of General Education information will improve Description: Including pain rating scale, medication(s)/side effects and non-pharmacologic comfort measures Outcome: Progressing   Problem: Health Behavior/Discharge Planning: Goal: Ability to manage health-related needs will improve Outcome: Progressing   Problem: Clinical Measurements: Goal: Ability to maintain clinical measurements within normal limits will improve Outcome: Progressing Goal: Will remain free from infection Outcome: Progressing Goal: Diagnostic test results will improve Outcome: Progressing Goal: Respiratory complications will improve Outcome: Progressing Goal: Cardiovascular complication will  be avoided Outcome: Progressing   Problem: Activity: Goal: Risk for activity intolerance will decrease Outcome: Progressing   Problem: Nutrition: Goal: Adequate nutrition will be maintained Outcome: Progressing   Problem: Elimination: Goal: Will not experience complications related to bowel motility Outcome: Progressing Goal: Will not experience complications related to urinary retention Outcome: Progressing   Problem: Pain Managment: Goal: General experience of comfort will improve and/or be controlled Outcome: Progressing   Problem: Safety: Goal: Ability to remain free from injury will improve Outcome: Progressing   Problem: Skin Integrity: Goal: Risk for impaired skin integrity will decrease Outcome: Progressing

## 2024-03-22 NOTE — Progress Notes (Signed)
 Physician Discharge Summary  Charles Lloyd Juanito Mickey. 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 He was found to have a 3.5 x 2.4 cm L psoas muscle abscess. Neurosurgery recommended IR drainage and IV antibiotics. Underwent CT-guided aspiration on 02/24/24. Developed hospital-associated delirium requiring ICU transfer, now improved. Currently on antibiotics through 04/10/2024 as per ID recommendations. Psychiatry consulted on 03/06/2024 for intermittent agitation and as per family request. Palliative care also consulted for goals of care discussion.   Patient remains medically stable for discharge at this point - mental status (while waxing and waning) has continued to stabilize with poorly intelligible speech but able to follow commands. Will likely need prolonged therapy in regards to speech and physical/occupational - discharge to SNF today given insurance Auth and bed approved.  Discharged 03/21/24 given insurance auth and bed availability - facility was unavailable/did not return calls to arrange transport to facility.  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 - Patient seen in consultation by IR and patient underwent CT-guided aspiration of left psoas fluid collection with drain placement. - Cultures remain negative - Infectious disease following, transition back transition back to daptomycin  given resolution of CK, add ertapenem  through 04/10/2024    Acute metabolic encephalopathy/delirium/alcohol  withdrawal syndrome with delirium/possible underlying dementia Cannot rule out polypharmacy - Patient baseline sharp as attack per son - Initial agitation likely in the setting of infection - Subsequent medications including benzodiazepine, Precedex , phenobarbital  with no real improvement in patient's mental status and ongoing agitation -given age polypharmacy is of concern - Subsequent renal dysfunction and liver dysfunction in the setting of multiple medications and infection likely contributing as well - MRI 02/24/2024 negative, repeat CT head on 03/10/2024 negative for acute process - Labs wholly unremarkable including ammonia, B12, TSH,, TSH, - EEG unrevealing -consistent with metabolic/toxic etiology - Psychiatry consulted per family request 03/06/2024 -initiated on Rozerem  with subsequent somnolence somnolence somnolence and stupor with questionable tremors and poor p.o. intake -subsequently discontinued - Long longstanding gabapentin  also discontinued in this timeframe - Continue to follow patient clinically given ongoing improvement, although mild, over the past few days -continue advance diet as tolerated   Acute kidney injury,  resolving Hypovolemic hyponatremia, resolved Metabolic acidosis - Likely multifactorial in setting of poor p.o. 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 intake and IV fluids - ANA, ANCA -within normal limits, kappa lambda light chains noted to be mildly elevated but ratio within normal limits - Nephrology was following but have signed off.  - Bicarb tablets discontinued.    Elevated LFTs, resolving -Likely multifactorial similar to a late AKI as above in the setting of poor p.o. intake, hypovolemia, hypotension and medication injury - Hepatitis panel negative other than reactive hepatitis C antibody(false positive given subsequent confirmatory tests negative) - Imaging unremarkable other than other than hepatic steatosis and slightly echogenic liver, nonspecific - Statin discontinued - Restart daptomycin  given discussion with ID, vancomycin  discontinued - GI previously consulted with concern for drug-induced liver injury    Paroxysmal atrial fibrillation - Continue Coreg  and flecainide  for rate control. - Eliquis  for anticoagulation.   Chronic diastolic heart failure/hypertension/hyperlipidemia - Continue hydralazine , Coreg . 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  - Continue to hold gabapentin  secondary to somnolence/drowsiness.   Asthma -Stable, not in acute exacerbation. - Continue montelukast .   Recurrent falls -PT/OT. - Tentative plan for discharge to SNF   Acute on chronic urinary retention - Patient noted to have failed voiding trial on 03/04/2024 and Foley catheter had to be placed on 03/04/2024. - Continue Flomax . - Plan to discharge with Foley catheter in place with outpatient follow-up with urology.    Moderate malnutrition - advance diet as tolerated Anemia of chronic disease - Hemoglobin stable   Pressure injury, POA Wound 02/24/24 1245 Pressure Injury Buttocks Mid;Lower Deep Tissue Pressure Injury - Purple or maroon localized area of discolored intact skin or blood-filled blister due to damage of underlying soft tissue from pressure and/or shear. (Active)     Wound 02/24/24 1245 Pressure Injury Scrotum Circumferential Deep Tissue Pressure Injury - Purple or maroon localized area of discolored intact skin or blood-filled blister due to damage of underlying soft tissue from pressure and/or shear. (Active)      Discharge Instructions  Discharge Instructions     [facility] pharmacist to adjust dose for Vancomycin , Aminoglycosides and other anti-infective therapies as requested by physician.   Complete by: As directed    [facility] to provide Cath Flo 2mg    Complete by: As directed    Administer for PICC line occlusion and as ordered by physician for other access device issues.   Ambulatory referral to Urology   Complete by: As directed    Anaphylaxis Kit: Provided to treat any anaphylactic reaction to the medication being provided to the patient if First Dose or when requested by physician   Complete by: As directed    Epinephrine  1mg /ml vial / amp: Administer 0.3mg  (0.68ml) subcutaneously once for moderate to severe anaphylaxis, nurse to call physician and pharmacy when reaction occurs and call 911 if needed for immediate care   Diphenhydramine  50mg /ml IV vial: Administer 25-50mg  IV/IM PRN for first dose reaction, rash, itching, mild reaction, nurse to call physician and pharmacy when reaction occurs   Sodium Chloride  0.9% NS 500ml IV: Administer if needed for hypovolemic blood pressure drop or as ordered by physician after call to physician with anaphylactic reaction   Call MD for:  difficulty breathing, headache or visual disturbances   Complete by: As  directed    Call MD for:  difficulty breathing, headache or visual disturbances   Complete by: As directed  Call MD for:  extreme fatigue   Complete by: As directed    Call MD for:  extreme fatigue   Complete by: As directed    Call MD for:  hives   Complete by: As directed    Call MD for:  hives   Complete by: As directed    Call MD for:  persistant dizziness or light-headedness   Complete by: As directed    Call MD for:  persistant dizziness or light-headedness   Complete by: As directed    Call MD for:  persistant nausea and vomiting   Complete by: As directed    Call MD for:  severe uncontrolled pain   Complete by: As directed    Call MD for:  severe uncontrolled pain   Complete by: As directed    Call MD for:  temperature >100.4   Complete by: As directed    Call MD for:  temperature >100.4   Complete by: As directed    Catheter care   Complete by: As directed    Change dressing on IV access line weekly and PRN   Complete by: As directed    Diet Carb Modified   Complete by: As directed    Diet general   Complete by: As directed    Dysphagia 2, thin liquids   Discharge instructions   Complete by: As directed    1. You are being discharged to short-term rehabilitation at [facility] 2. Continue Daptomycin /Cefepime  via PICC line through 04/13/24 (including after discharge from rehab) 3. Recommend CBC/BMP in the next 48 hours 4. Continue foley catheter; follow up with urology for voiding trial in the future 5. [facility] at 11[facility] 6. Follow up with Dr. Theophilus after discharge from rehab 7. Maintain PICC line   Flush IV access with Sodium Chloride  0.9% and Heparin  10 units/ml or 100 units/ml   Complete by: As directed    Home infusion instructions   Complete by: As directed    Instructions: Flushing of vascular access device: 0.9% NaCl pre/post medication administration and prn patency; Heparin  100 u/ml, 5ml for implanted ports  and Heparin  10u/ml, 5ml for all other central venous catheters.   Home infusion instructions - [facility]   Complete by: As directed    Instructions: Flush IV access with Sodium Chloride  0.9% and Heparin  10units/ml or 100units/ml   Change dressing on IV access line: Weekly and PRN   Instructions Cath Flo 2mg : Administer for PICC Line occlusion and as ordered by physician for other access device   [facility] pharmacist to adjust dose for: Vancomycin , Aminoglycosides and other anti-infective therapies as requested by physician   Increase activity slowly   Complete by: As directed    Increase activity slowly   Complete by: As directed    Method of administration may be changed at the discretion of home infusion pharmacist based upon assessment of the patient and/or caregivers ability to self-administer the medication ordered   Complete by: As directed    No wound care   Complete by: As directed    No wound care   Complete by: As directed       Allergies as of 03/22/2024       Reactions   Amlodipine  Nausea Only, Swelling, Other (See Comments)   Severe edema and chest pain- also   Chlorhexidine  Gluconate Itching, Other (See Comments)   Only itching with wipes, not with soap   Ativan  [lorazepam ]  Other (See Comments)   Agitation/hallucination - not a true allergy        Medication List     PAUSE taking these medications    potassium chloride  SA 20 MEQ tablet Wait to take this until your doctor or other care provider tells you to start again. Commonly known as: Klor-Con  M20 Take 1 tablet (20 mEq total) by mouth daily.       STOP taking these medications    atorvastatin  20 MG tablet Commonly known as: LIPITOR   cyclobenzaprine  10 MG tablet Commonly known as: FLEXERIL    oxyCODONE  5 MG immediate release tablet Commonly known as: Oxy IR/ROXICODONE        TAKE these medications    acetaminophen  325 MG tablet Commonly known as: TYLENOL  Take 2  tablets (650 mg total) by mouth every 6 (six) hours as needed for mild pain (pain score 1-3) or fever (or Fever >/= 101).   allopurinol  100 MG tablet Commonly known as: ZYLOPRIM  Take 1 tablet (100 mg total) by mouth daily. What changed:  medication strength how much to take   apixaban  2.5 MG Tabs tablet Commonly known as: ELIQUIS  Take 1 tablet (2.5 mg total) by mouth 2 (two) times daily. What changed:  medication strength how much to take   carvedilol  3.125 MG tablet Commonly known as: COREG  TAKE 1 TABLET BY MOUTH TWICE A DAY What changed: when to take this   daptomycin  IVPB Commonly known as: CUBICIN  Inject 600 mg into the vein daily for 28 days. Indication:  Psoas abscess First Dose: Yes Last Day of Therapy:  04/10/24 Labs - Once weekly:  CBC/D, BMP, and CPK Labs - Once weekly: ESR and CRP Method of administration: IV Push Method of administration may be changed at the discretion of home infusion pharmacist based upon assessment of the patient and/or caregiver's ability to self-administer the medication ordered.   docusate sodium  100 MG capsule Commonly known as: Colace Take 1 capsule (100 mg total) by mouth daily as needed (please take daily while on narcotic pain medication).   ertapenem  IVPB Commonly known as: INVANZ  Inject 1 g into the vein daily for 28 days. Indication:  Psoas abscess First Dose: Yes Last Day of Therapy:  04/10/24 Labs - Once weekly:  CBC/D and BMP, Labs - Once weekly: ESR and CRP Method of administration: Mini-Bag Plus / Gravity Method of administration may be changed at the discretion of home infusion pharmacist based upon assessment of the patient and/or caregiver's ability to self-administer the medication ordered.   feeding supplement Liqd Take 237 mLs by mouth 2 (two) times daily between meals.   flecainide  50 MG tablet Commonly known as: TAMBOCOR  Take 1 tablet (50 mg total) by mouth 2 (two) times daily. What changed:  medication  strength how much to take   fluticasone  50 MCG/ACT nasal spray Commonly known as: FLONASE  Place 2 sprays into both nostrils daily. What changed:  when to take this reasons to take this   folic acid  1 MG tablet Commonly known as: FOLVITE  Take 1 tablet (1 mg total) by mouth daily.   gabapentin  300 MG capsule Commonly known as: NEURONTIN  Take 1-2 capsules (300-600 mg total) by mouth See admin instructions. Take 300 mg by mouth in the morning & at noon and 600 mg at bedtime   hydrALAZINE  25 MG tablet Commonly known as: APRESOLINE  TAKE 1 TABLET (25 MG TOTAL) BY MOUTH 4 (FOUR) TIMES DAILY. What changed: when to take this   hydrocortisone  cream 1 % Apply  topically 3 (three) times daily.   montelukast  10 MG tablet Commonly known as: SINGULAIR  Take 10 mg by mouth at bedtime.   multivitamin with minerals Tabs tablet Take 1 tablet by mouth daily.   oxybutynin  5 MG tablet Commonly known as: DITROPAN  Take 1 tablet (5 mg total) by mouth every 8 (eight) hours as needed for bladder spasms.   Restasis  0.05 % ophthalmic emulsion Generic drug: cycloSPORINE  Place 1 drop into both eyes in the morning and at bedtime.   sodium chloride  flush 0.9 % Soln Commonly known as: NS 10-40 mLs by Intracatheter route as needed (flush).   sodium chloride  flush 0.9 % Soln Commonly known as: NS Inject 3 mLs into the vein every 12 (twelve) hours.   tamsulosin  0.4 MG Caps capsule Commonly known as: FLOMAX  Take 1 capsule (0.4 mg total) by mouth daily after supper.   torsemide  10 MG tablet Commonly known as: DEMADEX  Take 1 tablet (10 mg total) by mouth 2 (two) times daily. What changed:  medication strength how much to take when to take this               Home Infusion Instuctions  (From admission, onward)           Start     Ordered   02/28/24 0000  Home infusion instructions       Question:  Instructions  Answer:  Flushing of vascular access device: 0.9% NaCl pre/post  medication administration and prn patency; Heparin  100 u/ml, 5ml for implanted ports and Heparin  10u/ml, 5ml for all other central venous catheters.   02/28/24 1444              Discharge Care Instructions  (From admission, onward)           Start     Ordered   03/21/24 0000  Change dressing on IV access line weekly and PRN  (Home infusion instructions - [facility] )        03/21/24 1215            Follow-up Information     Theophilus Andrews, Tully GRADE, MD. Schedule an appointment as soon as possible for a visit.   Specialty: Internal Medicine Why: after discharge from rehab Contact information: [address] [place] [address] [phone]         [place] Constance DASEN, MD Follow up on 03/17/2024.   Specialty: Infectious Diseases Why: 11 AM Contact information: [address] [place] [address] [phone]         [facility]. Schedule an appointment as soon as possible for a visit.   Contact information: [address] [place]  [address] [phone]               Allergies[1]  Consultations: Nephrology, GI, infectious disease, psychiatry    Procedures/Studies: DG Lumbar Spine 2-3 Views Result Date: 03/19/2024 CLINICAL DATA:  Elective surgery. History of recent falls. Numbness of legs. Recent surgery. EXAM: LUMBAR SPINE - 2-3 VIEW COMPARISON:  Lumbar spine CT 02/22/2024, radiograph 02/06/2024 FINDINGS: Technically limited due to patient's difficulty with positioning. S1 is transitional. The lateral views are significantly oblique. Posterior rod and pedicle screw fixation at L4-L5. The hardware is grossly intact. Interbody spacer in place. No evidence of acute fracture. Diffuse degenerative disc disease with multilevel facet hypertrophy, better delineated on prior CT. IMPRESSION: 1. Technically limited exam due to patient's difficulty with positioning. 2. Posterior rod and pedicle  screw  fixation at L4-L5. No evidence of hardware complication. 3. No evidence of acute fracture, although technically limited by positioning. 4. Multilevel degenerative disc disease and facet hypertrophy. Electronically Signed   By: Andrea Gasman M.D.   On: 03/19/2024 18:08   US  Abdomen Limited RUQ (LIVER/GB) Result Date: 03/12/2024 CLINICAL DATA:  Transaminitis EXAM: ULTRASOUND ABDOMEN LIMITED RIGHT UPPER QUADRANT COMPARISON:  CT 11/16/2021 FINDINGS: Gallbladder: Surgically absent Common bile duct: Diameter: 5.4 mm Liver: Slightly echogenic liver parenchyma. Small cyst in the right hepatic lobe measuring 13 mm. Portal vein is patent on color Doppler imaging with normal direction of blood flow towards the liver. Other: Small right pleural effusion incidentally noted IMPRESSION: 1. Status post cholecystectomy. No biliary dilatation. 2. Slightly echogenic liver parenchyma suggesting hepatic steatosis and or hepatocellular disease. 3. Small right pleural effusion. Electronically Signed   By: Luke Bun M.D.   On: 03/12/2024 15:37   EEG adult Result Date: 03/11/2024 Shelton Arlin KIDD, MD     03/11/2024  3:52 PM Patient Name: Charles Lloyd Juanito Mickey. MRN: [id number] Epilepsy Attending: Arlin KIDD Shelton Referring Physician/Provider: Sebastian Toribio GAILS, MD Date: 03/11/2024 Duration: 24.26 mins Patient history: 84yo M with jerking. EEG to evaluate for seizure Level of alertness: Awake AEDs during EEG study: None Technical aspects: This EEG study was done with scalp electrodes positioned according to the 10-20 International system of electrode placement. Electrical activity was reviewed with band pass filter of 1-70Hz , sensitivity of 7 uV/mm, display speed of 48mm/sec with a 60Hz  notched filter applied as appropriate. EEG data were recorded continuously and digitally stored.  Video monitoring was available and reviewed as appropriate. Description: EEG showed continuous generalized 3 to 6 Hz theta-delta slowing, triphasic  morphology. Hyperventilation and photic stimulation were not performed.   ABNORMALITY - Continuous slow, generalized IMPRESSION: This study is suggestive of generalized cerebral dysfunction (encephalopathy) likely related to toxic-metabolic etiology. No seizures or epileptiform discharges were seen throughout the recording. Priyanka KIDD Shelton   CT HEAD WO CONTRAST ( ) Result Date: 03/10/2024 CLINICAL DATA:  Delirium acute change EXAM: CT HEAD WITHOUT CONTRAST TECHNIQUE: Contiguous axial images were obtained from the base of the skull through the vertex without intravenous contrast. RADIATION DOSE REDUCTION: This exam was performed according to the departmental dose-optimization program which includes automated exposure control, adjustment of the mA and/or kV according to patient size and/or use of iterative reconstruction technique. COMPARISON:  MRI 02/24/2024, CT brain 02/22/2024 FINDINGS: Brain: No acute territorial infarction, hemorrhage or intracranial mass. Moderate atrophy. Mild chronic small vessel ischemic changes of the white matter. Stable ventricular size Vascular: No hyperdense vessels.  Carotid vascular calcification Skull: Normal. Negative for fracture or focal lesion. Sinuses/Orbits: No acute finding. Other: None IMPRESSION: 1. No CT evidence for acute intracranial abnormality. 2. Atrophy and chronic small vessel ischemic changes of the white matter. Electronically Signed   By: Luke Bun M.D.   On: 03/10/2024 19:34   US  RENAL Result Date: 03/09/2024 CLINICAL DATA:  Acute kidney injury. EXAM: RENAL / URINARY TRACT ULTRASOUND COMPLETE COMPARISON:  12/30/2020 FINDINGS: Right Kidney: Renal measurements: 9.0 x 5.7 x 4.9 cm = volume: 132 mL. Echogenicity within normal limits. No mass or hydronephrosis visualized. Left Kidney: Renal measurements: 10.3 x 5.5 x 4.6 cm = volume: 136 mL. Echogenicity within normal limits. No mass or hydronephrosis visualized. Bladder: Bladder is decompressed with a  Foley catheter. Other: Anechoic structure in the right hepatic lobe near the right kidney. This is most compatible with a cyst and measures 1.7 x  1.1 x 1.4 cm. IMPRESSION: 1. Normal appearance of both kidneys.  No hydronephrosis. 2. Bladder is decompressed with a Foley catheter. Electronically Signed   By: Juliene Balder M.D.   On: 03/09/2024 13:44   US  EKG SITE RITE Result Date: 02/28/2024 If Site Rite image not attached, placement could not be confirmed due to current cardiac rhythm.  CT GUIDED PERITONEAL/RETROPERITONEAL FLUID DRAIN BY PERC CATH Result Date: 02/24/2024 INDICATION: 84 year old with a postoperative fluid collection in the left psoas muscle. EXAM: CT-guided aspiration of left psoas fluid collection MEDICATIONS: Moderate sedation ANESTHESIA/SEDATION: Moderate (conscious) sedation was employed during this procedure. A total of Versed  1mg  and fentanyl  50 mcg was administered intravenously at the order of the provider performing the procedure. Total intra-service moderate sedation time: 15 minutes. Patient's level of consciousness and vital signs were monitored continuously by radiology nurse throughout the procedure under the supervision of the provider performing the procedure. COMPLICATIONS: None immediate. PROCEDURE: Informed written consent was obtained from the patient after a thorough discussion of the procedural risks, benefits and alternatives. All questions were addressed. A timeout was performed prior to the initiation of the procedure. Patient was placed prone. CT images through the lower abdomen were obtained. Left side of the back was prepped with Betadine and sterile field was created. Skin was anesthetized using 1% lidocaine . Using CT guidance, a Yueh catheter was directed into the left psoas muscle. Initially, the needle was placed at the level of the L4 pedicle screw. However, no fluid could be aspirated. The Yueh catheter was directed more caudal into the anterior aspect of the left  psoas muscle and 1 mL of bloody fluid was aspirated. Bandage placed over the puncture site. RADIATION DOSE REDUCTION: This exam was performed according to the departmental dose-optimization program which includes automated exposure control, adjustment of the mA and/or kV according to patient size and/or use of iterative reconstruction technique. FINDINGS: Bilateral interbody fusion at L4-L5. Needle was directed into the anterior aspect of the left psoas muscle and 1 mL bloody fluid was removed. IMPRESSION: CT-guided aspiration of left psoas fluid collection. Fluid was sent for culture. Electronically Signed   By: Juliene Balder M.D.   On: 02/24/2024 22:02   MR BRAIN WO CONTRAST Result Date: 02/24/2024 EXAM: MRI BRAIN WITHOUT CONTRAST 02/24/2024 04:46:52 PM TECHNIQUE: Multiplanar multisequence MRI of the head/brain was performed without the administration of intravenous contrast. COMPARISON: CT head 02/22/2024 and earlier. CLINICAL HISTORY: Delirium. FINDINGS: BRAIN AND VENTRICLES: Mild generalized parenchymal volume loss. Mild prominence of extra spaces over the cerebral convexities, likely related to volume loss, without evidence of extra-axial fluid collection. White matter within normal limits for patient's age. No acute infarct. No intracranial hemorrhage. No mass. No midline shift. No hydrocephalus. The sella is unremarkable. Normal flow voids. ORBITS: Left lens replacement. SINUSES AND MASTOIDS: Mucosal thickening in the right maxillary sinus. Trace fluid in the mastoid air cells, right greater than left. BONES AND SOFT TISSUES: Normal marrow signal. No acute soft tissue abnormality. IMPRESSION: 1. No acute intracranial abnormality. Electronically signed by: Donnice Mania MD 02/24/2024 06:25 PM EST RP Workstation: [id number]   MR Lumbar Spine W Wo Contrast Result Date: 02/22/2024 EXAM: MRI LUMBAR SPINE 02/22/2024 06:47:20 PM TECHNIQUE: Multiplanar multisequence MRI of the lumbar spine was performed without  or with the administration of intravenous contrast. COMPARISON: MRI lumbar spine 10/30/2023 CLINICAL HISTORY: Low back pain, cauda equina syndrome suspected FINDINGS: SEGMENTATION: Transitional lumbosacral anatomy. The transitional lumbosacral vertebra is assumed to represent the S1 level. This conforms  to the segmentation utilized on prior exams. BONES AND ALIGNMENT: Similar grade 1 retrolisthesis of L2 on L3 and improved grade 1 anterolisthesis of L4 on L5. Interval L4-L5 interbody fusion and PLIF. Hardware artifact limits assessment, but no obviouse bone marrow signal abnormality. SPINAL CORD: The conus terminates normally. SOFT TISSUES: New 3.5 x 2.4 cm fluid collection in the left psoas muscle at the L4-L5 level. More illdefined edema in the posterior paraspinal soft tissues with small fluid pockets is more typical of postoperative change. L1-L2 Disc height loss with degenerative endplate signal changes. Disc bulge with mild to moderate canal stenosis. Patent foramina. L2-L3 Disc bulging and endplate spurring with ligamentum flavum thickening, resulting in progressive severe canal stenosis. Bilateral facet arthropathy with progressive moderate to severe left foraminal stenosis. Similar moderate right foraminal stenosis. L3-L4 Broad disc bulge with ligamentum flavum thickening and bilateral facet arthropathy. Similar versus slightly progressive moderate to severe canal stenosis. Similar moderate left foraminal stenosis. L4-L5 Interbody fusion and PLIF.  Persistent severe canal stenosis. L5-S1 Mild disc bulge with similar borderline mild foraminal stenosis. S1-S2 Transitional anatomy. No spinal canal stenosis or neural foraminal narrowing. IMPRESSION: 1. New 3.5 x 2.4 cm fluid collection in the left psoas muscle at the L4-L5 level with surrounding edema. The location is atypical for postoperative changes and is concerning for abscess. No obvious changes of discitis/osteomyelitis but hardware artifact limits  assessment. Recommend correlation with infectious markers. 2. At L2-L3, progressive severe canal stenosis and moderate to severe left foraminal stenosis. Similar moderate right foraminal stenosis. 3. At L3-L4, similar versus slightly progressive moderate to severe canal stenosis. 4. Interval fusion at L4-L5 with improved alignment but persistent severe canal stenosis. 5. Transitional lumbosacral vertebra is assumed to represent the S1 level. This conforms to the segmentation utilized on prior exams. Electronically signed by: Gilmore Molt MD 02/22/2024 09:12 PM EST RP Workstation: [id number]   CT PELVIS WO CONTRAST Result Date: 02/22/2024 CLINICAL DATA:  Fall in shower. History of frequent falls over the past 6-8 weeks. EXAM: CT PELVIS WITHOUT CONTRAST TECHNIQUE: Multidetector CT imaging of the pelvis was performed following the standard protocol without intravenous contrast. RADIATION DOSE REDUCTION: This exam was performed according to the departmental dose-optimization program which includes automated exposure control, adjustment of the mA and/or kV according to patient size and/or use of iterative reconstruction technique. COMPARISON:  Pelvic radiographs dated 02/22/2024. CT pelvis dated 10/30/2023. FINDINGS: Bones/Joint/Cartilage No acute fracture or dislocation. Femoral heads are seated within the acetabulum. 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 Bilateral greater trochanteric enthesopathy. The sacroiliac joints and pubic symphysis are anatomically aligned with degenerative changes. Partially visualized postoperative changes of the lower lumbar spine. Soft tissue and Muscles Soft tissue swelling along the left posterolateral abdominal wall. No loculated fluid collection. Intrapelvic contents Prostate implant seeds. Descending and sigmoid colonic diverticulosis. Moderate vascular calcifications again noted. No  enlarged lymph nodes identified in the field of view. IMPRESSION: 1. No acute osseous abnormality. 2. Soft tissue swelling along the left posterolateral abdominal wall. No fluid collection. 3. 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 Electronically Signed   By: Harrietta Sherry M.D.   On: 02/22/2024 16:13   CT Lumbar Spine Wo Contrast Result Date: 02/22/2024 EXAM: CT OF THE LUMBAR SPINE WITHOUT CONTRAST 02/22/2024 03:28:05 PM TECHNIQUE: CT of the lumbar spine was performed without the administration of intravenous contrast. Multiplanar reformatted images are provided for review. Automated exposure control, iterative reconstruction, and/or weight based adjustment  of the mA/kV was utilized to reduce the radiation dose to as low as reasonably achievable. COMPARISON: MRI and CT lumbar spine 10/30/2023. CLINICAL HISTORY: Back trauma, no prior imaging (Age >= 16y). FINDINGS: BONES AND ALIGNMENT: Normal vertebral body heights. No evidence of compression fracture or displaced fracture in the lumbar spine. Redemonstrated transitional anatomy at the lumbosacral junction with a partially lumbarized S1 vertebral body and a near full size disc at the S1-S2 level. Interval PLIF at L4-L5 with bilateral pedicle screws and vertical interconnecting rods. Hardware is intact. Interbody spacer at L4-L5. There is no osseous fusion across the L4-L5 disc space on the current study. Diffuse osteopenia. Degenerative changes of the sacroiliac joints. Alignment: Trace degenerative retrolisthesis of L1 on L2 and L2 on L3. Similar grade 1 anterolisthesis of L4 on L5. DEGENERATIVE CHANGES: Disc space narrowing and vacuum disc phenomenon at multiple levels. Degenerative endplate osteophytes at multiple levels. Posterior osteophytes along with facet arthrosis at L2-L3 contributing to moderate to severe spinal canal stenosis. Additional disc bulge and facet arthrosis at L3-L4 resulting in at least moderate spinal  canal stenosis, although streak artifact slightly limits evaluation. Streak artifact limits evaluation of the spinal canal at L4-L5; there is likely residual moderate to severe spinal canal stenosis at this level. SOFT TISSUES: Atherosclerosis of the abdominal aorta and branch vessels. Additional prominent atherosclerosis within the pelvis. Cholecystectomy clips. Diverticulosis of the partially visualized sigmoid colon. IMPRESSION: 1. No evidence of acute traumatic injury. 2. Interval PLIF at L4-L5. Hardware is intact. No osseous fusion across the L4-L5 disc space. 3. 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 4. At least moderate spinal canal stenosis at L3-L4 due to disc bulge and facet arthrosis. 5. Likely residual moderate to severe spinal canal stenosis at L4-L5, evaluation limited by streak artifact. Electronically signed by: Donnice Mania MD 02/22/2024 04:04 PM EST RP Workstation: [id number]   DG Pelvis 1-2 Views Result Date: 02/22/2024 EXAM: 1 or 2 VIEW(S) XRAY OF THE PELVIS 02/22/2024 01:09:00 PM COMPARISON: None available. CLINICAL HISTORY: [id number] Fall [id number] FINDINGS: BONES AND JOINTS: No acute fracture. No malalignment. Lumbar spinal fusion hardware noted. Moderate bilateral hip degenerative changes. SOFT TISSUES: Surgical clips overlie the pelvis. Vascular calcifications noted. IMPRESSION: 1. No evidence of acute traumatic injury. Electronically signed by: Morgane Naveau MD 02/22/2024 01:32 PM EST RP Workstation: [id number]   DG Chest 1 View Result Date: 02/22/2024 EXAM: 1 VIEW(S) XRAY OF THE CHEST 02/22/2024 01:09:00 PM COMPARISON: 10/19/2022 CLINICAL HISTORY: Fall FINDINGS: LUNGS AND PLEURA: No focal pulmonary opacity. No pleural effusion. No pneumothorax. HEART AND MEDIASTINUM: Calcified aorta. BONES AND SOFT TISSUES: Thoracic degenerative changes. No acute osseous abnormality. IMPRESSION: 1. No acute process. Electronically signed by:  Morgane Naveau MD 02/22/2024 01:31 PM EST RP Workstation: [id number]   CT Cervical Spine Wo Contrast Result Date: 02/22/2024 EXAM: CT CERVICAL SPINE WITHOUT CONTRAST 02/22/2024 12:58:30 PM TECHNIQUE: CT of the cervical spine was performed without the administration of intravenous contrast. Multiplanar reformatted images are provided for review. Automated exposure control, iterative reconstruction, and/or weight based adjustment of the mA/kV was utilized to reduce the radiation dose to as low as reasonably achievable. COMPARISON: 10/30/2023 and 12/30/2020 CLINICAL HISTORY: Neck trauma (Age >= 65y) FINDINGS: BONES AND ALIGNMENT: No acute fracture or traumatic malalignment. There is straightening of the normal cervical lordosis. There is 3 mm anterolisthesis of C7 on T1 which is increased by approximately 1 mm since the 2022 study. Additional 4 mm anterolisthesis of T1 on  T2 which is increased by approximately 1.5 x 2 mm since the 2022 study. DEGENERATIVE CHANGES: There is moderate to severe disc space narrowing at multiple levels in the cervical spine most pronounced at C5-C6 and C6-C7 overall similar to prior. There is severe disc space narrowing at T1-T2 which has increased from prior. Degenerative endplate osteophytes at multiple levels. Disc osteophyte complexes at multiple levels without high grade osseous spinal canal stenosis. There is facet arthrosis and uncovertebral hypertrophy at multiple levels. SOFT TISSUES: No prevertebral soft tissue swelling. IMPRESSION: 1. No acute cervical spine fracture. 2. 3 mm anterolisthesis of C7 on T1, increased from prior. 4 mm anterolisthesis of T1 on T2, increased since 2022. Findings likely related to increased degenerative changes. Electronically signed by: Donnice Mania MD 02/22/2024 01:21 PM EST RP Workstation: [id number]   CT Head Wo Contrast Result Date: 02/22/2024 EXAM: CT HEAD WITHOUT CONTRAST 02/22/2024 12:58:30 PM TECHNIQUE: CT of the head was performed  without the administration of intravenous contrast. Automated exposure control, iterative reconstruction, and/or weight based adjustment of the mA/kV was utilized to reduce the radiation dose to as low as reasonably achievable. COMPARISON: 10/30/2023 CLINICAL HISTORY: Head trauma, minor (Age >= 65y) FINDINGS: BRAIN AND VENTRICLES: No acute hemorrhage. No evidence of acute infarct. No hydrocephalus. No extra-axial collection. No mass effect or midline shift. Patchy and confluent decreased attenuation throughout the deep and periventricular white matter of the cerebral hemispheres bilaterally, compatible with chronic microvascular ischemic disease. Cerebral ventricle sizes concordant with degree of cerebral volume loss. Atherosclerotic calcifications within the cavernous internal carotid arteries. ORBITS: No acute abnormality. Left lens replacement. SINUSES: No acute abnormality. SOFT TISSUES AND SKULL: No acute soft tissue abnormality. No skull fracture. IMPRESSION: 1. No acute intracranial abnormality. Electronically signed by: Donnice Mania MD 02/22/2024 01:14 PM EST RP Workstation: [id number]     Subjective: No acute issues/events reported overnight   Discharge Exam: Vitals:   03/21/24 1930 03/22/24 0425  BP: (!) 128/58 135/79  Pulse: 83 74  Resp: 16 20  Temp: 98.8 F (37.1 C) 97.8 F (36.6 C)  SpO2: 93% 91%   Vitals:   03/21/24 0900 03/21/24 1331 03/21/24 1930 03/22/24 0425  BP:  (!) 131/50 (!) 128/58 135/79  Pulse:  84 83 74  Resp:  16 16 20   Temp: (!) 101.4 F (38.6 C) 98.7 F (37.1 C) 98.8 F (37.1 C) 97.8 F (36.6 C)  TempSrc:  Oral Oral Oral  SpO2:  93% 93% 91%  Weight:      Height:        General:  Pleasantly resting in bed, No acute distress.  Notable stutter and word finding difficulty HEENT:  Normocephalic atraumatic.  Sclerae nonicteric, noninjected.  Extraocular movements intact bilaterally. Neck:  Without mass or deformity.  Trachea is midline. Lungs:  Clear to  auscultate bilaterally without rhonchi, wheeze, or rales. Heart:  Regular rate and rhythm.  Without murmurs, rubs, or gallops. Abdomen:  Soft, nontender, nondistended.  Without guarding or rebound. Extremities: Without cyanosis, or edema   The results of significant diagnostics from this hospitalization (including imaging, microbiology, ancillary and laboratory) are listed below for reference.     Microbiology: No results found for this or any previous visit (from the past 240 hours).    Labs: BNP (last 3 results) No results for input(s): BNP in the last 8760 hours. Basic Metabolic Panel: Recent Labs  Lab 03/16/24 0149 03/17/24 0049 03/18/24 0940 03/20/24 0540 03/21/24 0833  NA 142 141 145 145 146*  K 3.5  3.3* 3.7 3.6 3.6  CL 111 110 113* 113* 113*  CO2 21* 23 22 22  21*  GLUCOSE 104* 125* 96 73 78  BUN 24* 22 21 19 19   CREATININE 1.73* 1.57* 1.44* 1.59* 1.64*  CALCIUM  9.0 9.3 9.4 9.0 9.3   Liver Function Tests: Recent Labs  Lab 03/16/24 0149 03/17/24 0049 03/18/24 0940 03/20/24 0540 03/21/24 0833  AST 305* 210* 147* 80* 61*  ALT 173* 162* 147* 94* 84*  ALKPHOS 86 80 101 74 82  BILITOT 0.5 0.5 0.5 0.5 0.7  PROT 5.1* 4.7* 5.0* 4.9* 5.3*  ALBUMIN  2.4* 2.6* 2.5* 2.3* 2.6*   No results for input(s): LIPASE, AMYLASE in the last 168 hours. Recent Labs  Lab 03/16/24 2104  AMMONIA 19   CBC: Recent Labs  Lab 03/16/24 0149 03/17/24 0049 03/18/24 0940  WBC 5.2 3.7* 3.7*  NEUTROABS  --   --  1.6*  HGB 8.0* 7.6* 8.1*  HCT 25.0* 24.3* 26.0*  MCV 91.6 91.7 93.2  PLT 214 184 208   Cardiac Enzymes: Recent Labs  Lab 03/16/24 0149 03/18/24 0940 03/20/24 0540  CKTOTAL 1,065* 209 82   BNP: Invalid input(s): POCBNP CBG: Recent Labs  Lab 03/20/24 2316 03/21/24 0808 03/21/24 1140 03/21/24 1631 03/21/24 2100  GLUCAP 82 72 69* 88 95   D-Dimer No results for input(s): DDIMER in the last 72 hours. Hgb A1c No results for input(s): HGBA1C in the  last 72 hours. Lipid Profile No results for input(s): CHOL, HDL, LDLCALC, TRIG, CHOLHDL, LDLDIRECT in the last 72 hours. Thyroid  function studies No results for input(s): TSH, T4TOTAL, T3FREE, THYROIDAB in the last 72 hours.  Invalid input(s): FREET3 Anemia work up No results for input(s): VITAMINB12, FOLATE, FERRITIN, TIBC, IRON, RETICCTPCT in the last 72 hours. Urinalysis    Component Value Date/Time   COLORURINE RED (A) 03/10/2024 1426   APPEARANCEUR (A) 03/10/2024 1426    TEST NOT REPORTED DUE TO COLOR INTERFERENCE OF URINE PIGMENT   LABSPEC  03/10/2024 1426    TEST NOT REPORTED DUE TO COLOR INTERFERENCE OF URINE PIGMENT   PHURINE  03/10/2024 1426    TEST NOT REPORTED DUE TO COLOR INTERFERENCE OF URINE PIGMENT   GLUCOSEU (A) 03/10/2024 1426    TEST NOT REPORTED DUE TO COLOR INTERFERENCE OF URINE PIGMENT   HGBUR (A) 03/10/2024 1426    TEST NOT REPORTED DUE TO COLOR INTERFERENCE OF URINE PIGMENT   BILIRUBINUR (A) 03/10/2024 1426    TEST NOT REPORTED DUE TO COLOR INTERFERENCE OF URINE PIGMENT   BILIRUBINUR 1+ 07/12/2022 1120   KETONESUR (A) 03/10/2024 1426    TEST NOT REPORTED DUE TO COLOR INTERFERENCE OF URINE PIGMENT   PROTEINUR (A) 03/10/2024 1426    TEST NOT REPORTED DUE TO COLOR INTERFERENCE OF URINE PIGMENT   UROBILINOGEN negative (A) 07/12/2022 1120   NITRITE (A) 03/10/2024 1426    TEST NOT REPORTED DUE TO COLOR INTERFERENCE OF URINE PIGMENT   LEUKOCYTESUR (A) 03/10/2024 1426    TEST NOT REPORTED DUE TO COLOR INTERFERENCE OF URINE PIGMENT   Sepsis Labs Recent Labs  Lab 03/16/24 0149 03/17/24 0049 03/18/24 0940  WBC 5.2 3.7* 3.7*   Microbiology No results found for this or any previous visit (from the past 240 hours).    Time coordinating discharge: Over 30 minutes  SIGNED:   Elsie JAYSON Montclair, DO [facility] 03/22/2024, 7:12 AM Pager   If 7PM-7AM, please contact night-coverage [email or web address]      [1]   Allergies Allergen Reactions  Amlodipine  Nausea Only, Swelling and Other (See Comments)    Severe edema and chest pain- also   Chlorhexidine  Gluconate Itching and Other (See Comments)    Only itching with wipes, not with soap   Ativan  [Lorazepam ] Other (See Comments)    Agitation/hallucination - not a true allergy

## 2024-03-22 NOTE — Plan of Care (Signed)

## 2024-03-23 DIAGNOSIS — K6812 Psoas muscle abscess: Secondary | ICD-10-CM | POA: Diagnosis not present

## 2024-03-23 LAB — COMPREHENSIVE METABOLIC PANEL WITH GFR
ALT: 69 U/L — ABNORMAL HIGH (ref 0–44)
AST: 79 U/L — ABNORMAL HIGH (ref 15–41)
Albumin: 2.5 g/dL — ABNORMAL LOW (ref 3.5–5.0)
Alkaline Phosphatase: 76 U/L (ref 38–126)
Anion gap: 13 (ref 5–15)
BUN: 21 mg/dL (ref 8–23)
CO2: 22 mmol/L (ref 22–32)
Calcium: 9.2 mg/dL (ref 8.9–10.3)
Chloride: 115 mmol/L — ABNORMAL HIGH (ref 98–111)
Creatinine, Ser: 1.56 mg/dL — ABNORMAL HIGH (ref 0.61–1.24)
GFR, Estimated: 44 mL/min — ABNORMAL LOW
Glucose, Bld: 98 mg/dL (ref 70–99)
Potassium: 3 mmol/L — ABNORMAL LOW (ref 3.5–5.1)
Sodium: 151 mmol/L — ABNORMAL HIGH (ref 135–145)
Total Bilirubin: 0.6 mg/dL (ref 0.0–1.2)
Total Protein: 5.2 g/dL — ABNORMAL LOW (ref 6.5–8.1)

## 2024-03-23 LAB — GLUCOSE, CAPILLARY
Glucose-Capillary: 79 mg/dL (ref 70–99)
Glucose-Capillary: 85 mg/dL (ref 70–99)
Glucose-Capillary: 112 mg/dL — ABNORMAL HIGH (ref 70–99)
Glucose-Capillary: 108 mg/dL — ABNORMAL HIGH (ref 70–99)

## 2024-03-23 LAB — CK: Total CK: 995 U/L — ABNORMAL HIGH (ref 49–397)

## 2024-03-23 MED ORDER — LINEZOLID 600 MG PO TABS: 600.0000 mg | ORAL_TABLET | Freq: Two times a day (BID) | ORAL | Status: AC

## 2024-03-23 MED ORDER — LINEZOLID 600 MG PO TABS
600.0000 mg | ORAL_TABLET | Freq: Two times a day (BID) | ORAL | Status: DC
  Administered 2024-03-23 – 2024-03-31 (×15): 600 mg via ORAL
  Filled 2024-03-23 (×17): qty 1

## 2024-03-23 MED ORDER — POTASSIUM CHLORIDE CRYS ER 20 MEQ PO TBCR: 20.0000 meq | EXTENDED_RELEASE_TABLET | Freq: Every day | ORAL | Status: DC

## 2024-03-23 MED ORDER — POTASSIUM CHLORIDE 20 MEQ PO PACK: 20.0000 meq | PACK | Freq: Every day | ORAL | Status: AC

## 2024-03-23 MED ORDER — POTASSIUM CHLORIDE 20 MEQ PO PACK
20.0000 meq | PACK | Freq: Every day | ORAL | Status: DC
  Administered 2024-03-24 – 2024-03-31 (×8): 20 meq via ORAL
  Filled 2024-03-23 (×8): qty 1

## 2024-03-23 MED ORDER — POTASSIUM CHLORIDE CRYS ER 20 MEQ PO TBCR: 40.0000 meq | EXTENDED_RELEASE_TABLET | Freq: Once | ORAL | Status: DC

## 2024-03-23 MED ORDER — POTASSIUM CHLORIDE 20 MEQ PO PACK
40.0000 meq | PACK | Freq: Once | ORAL | Status: AC
  Administered 2024-03-23: 40 meq via ORAL
  Filled 2024-03-23: qty 2

## 2024-03-23 NOTE — Progress Notes (Signed)
 Nutrition Follow-up  DOCUMENTATION CODES:   Non-severe (moderate) malnutrition in context of chronic illness  INTERVENTION:   -Continue Ensure Plus High Protein po BID, each supplement provides 350 kcal and 20 grams of protein.   -Multivitamin with minerals daily  NUTRITION DIAGNOSIS:   Moderate Malnutrition related to chronic illness as evidenced by moderate fat depletion, moderate muscle depletion, energy intake < or equal to 75% for > or equal to 1 month.  Ongoing.  GOAL:   Patient will meet greater than or equal to 90% of their needs  Not meeting.  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 He was found to have a 3.5 x 2.4 cm L psoas muscle abscess. Neurosurgery recommended IR drainage and IV antibiotics. Underwent CT-guided aspiration on 02/24/24. Developed hospital-associated delirium requiring ICU transfer, now improved. Currently on antibiotics through 04/10/2024 as per ID recommendations. Psychiatry consulted on 03/06/2024 for intermittent agitation and as per family request.  12/27: admitted after fall at home 12/29: s/p CT guided aspiration of left psoas fluid collection 1/14: EEG -encephalopathy 1/21: SLP recommending dysphagia 2 diet  Patient continues to wax/wane in mentation.  Not accepting Ensure supplements. Also receiving Magic cups with dinner and lunch meals.  Noted that discharge note has been placed and awaiting bed offers.   Calorie Count results:  1/23: B: 5% =90 kcals, 3g protein L: 5% = negligible amount D: 0% No supplements. Total: 90 kcals, 3g protein  1/24: B: 0% -refused No other intakes  1/25:  B: 10% = ~65 kcals, 2g protein No other intakes  1/26:  B: a few bites per NT (ordered eggs, sausage, cream of wheat)  Admission weight: 175 lbs Last weight 12/29: 174  lbs  Medications: Folic acid , Lasix , Multivitamin with minerals daily, KLOR-CON , Senokot, Thiamine    Labs reviewed: CBGs: 79 Elevated sodium Low potassium   Diet Order:   Diet Order             Diet general           DIET DYS 2 Room service appropriate? Yes; Fluid consistency: Thin  Diet effective now           Diet Carb Modified                   EDUCATION NEEDS:   Not appropriate for education at this time  Skin:  Skin Assessment: Skin Integrity Issues: Skin Integrity Issues:: Incisions, DTI DTI: sacrum. buttocks Incisions: 12/11 back  Last BM:  1/25 -type 6  Height:   Ht Readings from Last 1 Encounters:  02/24/24 5' 8 (1.727 m)    Weight:   Wt Readings from Last 1 Encounters:  03/19/24 79.3 kg    BMI:  Body mass index is 26.58 kg/m.  Estimated Nutritional Needs:   Kcal:  1750-1950  Protein:  80-90g  Fluid:  1.9L/day   Morna Lee, MS, RD, LDN Inpatient Clinical Dietitian Contact via Secure chat

## 2024-03-23 NOTE — Progress Notes (Signed)
 PHARMACY CONSULT NOTE FOR:  OUTPATIENT  PARENTERAL ANTIBIOTIC THERAPY (OPAT)  Indication: Psoas abscess Regimen: Ertapenem  1g IV every 24 hours and oral linezolid  600 mg po twice daily End date: 04/10/24  IV antibiotic discharge orders are pended. To discharging provider:  please sign these orders via discharge navigator,  Select New Orders & click on the button choice - Manage This Unsigned Work.     Thank you for allowing pharmacy to be a part of this patients care.  Almarie Lunger, PharmD, BCPS, BCIDP Infectious Diseases Clinical Pharmacist 03/23/2024 8:25 AM   **Pharmacist phone directory can now be found on [email or web address] ([email or web address]).  Listed under [facility].

## 2024-03-23 NOTE — Progress Notes (Signed)
 Physician Discharge Summary  Charles Lloyd. 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 He was found to have a 3.5 x 2.4 cm L psoas muscle abscess. Neurosurgery recommended IR drainage and IV antibiotics. Underwent CT-guided aspiration on 02/24/24. Developed hospital-associated delirium requiring ICU transfer, now improved. Currently on antibiotics through 04/10/2024 as per ID recommendations. Psychiatry consulted on 03/06/2024 for intermittent agitation and as per family request. Palliative care also consulted for goals of care discussion.   Patient remains medically stable for discharge at this point - mental status (while waxing and waning) has continued to stabilize with poorly intelligible speech but able to follow commands. Will likely need prolonged therapy in regards to speech and physical/occupational - discharge to SNF today given insurance Auth and bed approved.  Discharged 03/21/24 given insurance auth and bed availability - facility was unavailable/did not return calls to arrange transport to facility.  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 - Patient seen in consultation by IR and patient underwent CT-guided aspiration of left psoas fluid collection with drain placement. - Cultures remain negative - Infectious disease following, transition back transition back to daptomycin  given resolution of CK, add ertapenem  through 04/10/2024    Acute metabolic encephalopathy/delirium/alcohol  withdrawal syndrome with delirium/possible underlying dementia Cannot rule out polypharmacy - Patient baseline sharp as attack per son - Initial agitation likely in the setting of infection - Subsequent medications including benzodiazepine, Precedex , phenobarbital  with no real improvement in patient's mental status and ongoing agitation -given age polypharmacy is of concern - Subsequent renal dysfunction and liver dysfunction in the setting of multiple medications and infection likely contributing as well - MRI 02/24/2024 negative, repeat CT head on 03/10/2024 negative for acute process - Labs wholly unremarkable including ammonia, B12, TSH,, TSH, - EEG unrevealing -consistent with metabolic/toxic etiology - Psychiatry consulted per family request 03/06/2024 -initiated on Rozerem  with subsequent somnolence somnolence somnolence and stupor with questionable tremors and poor p.o. intake -subsequently discontinued - Long longstanding gabapentin  also discontinued in this timeframe - Continue to follow patient clinically given ongoing improvement, although mild, over the past few days -continue advance diet as tolerated   Acute kidney injury,  resolving Hypovolemic hyponatremia, resolved Metabolic acidosis - Likely multifactorial in setting of poor p.o. 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 intake and IV fluids - ANA, ANCA -within normal limits, kappa lambda light chains noted to be mildly elevated but ratio within normal limits - Nephrology was following but have signed off.  - Bicarb tablets discontinued.    Elevated LFTs, resolving -Likely multifactorial similar to a late AKI as above in the setting of poor p.o. intake, hypovolemia, hypotension and medication injury - Hepatitis panel negative other than reactive hepatitis C antibody(false positive given subsequent confirmatory tests negative) - Imaging unremarkable other than other than hepatic steatosis and slightly echogenic liver, nonspecific - Statin discontinued - Restart daptomycin  given discussion with ID, vancomycin  discontinued - GI previously consulted with concern for drug-induced liver injury    Paroxysmal atrial fibrillation - Continue Coreg  and flecainide  for rate control. - Eliquis  for anticoagulation.   Chronic diastolic heart failure/hypertension/hyperlipidemia - Continue hydralazine , Coreg . 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  - Continue to hold gabapentin  secondary to somnolence/drowsiness.   Asthma -Stable, not in acute exacerbation. - Continue montelukast .   Recurrent falls -PT/OT. - Tentative plan for discharge to SNF   Acute on chronic urinary retention - Patient noted to have failed voiding trial on 03/04/2024 and Foley catheter had to be placed on 03/04/2024. - Continue Flomax . - Plan to discharge with Foley catheter in place with outpatient follow-up with urology.    Moderate malnutrition - advance diet as tolerated Anemia of chronic disease - Hemoglobin stable   Pressure injury, POA Wound 02/24/24 1245 Pressure Injury Buttocks Mid;Lower Deep Tissue Pressure Injury - Purple or maroon localized area of discolored intact skin or blood-filled blister due to damage of underlying soft tissue from pressure and/or shear. (Active)     Wound 02/24/24 1245 Pressure Injury Scrotum Circumferential Deep Tissue Pressure Injury - Purple or maroon localized area of discolored intact skin or blood-filled blister due to damage of underlying soft tissue from pressure and/or shear. (Active)      Discharge Instructions  Discharge Instructions     [facility] pharmacist to adjust dose for Vancomycin , Aminoglycosides and other anti-infective therapies as requested by physician.   Complete by: As directed    [facility] to provide Cath Flo 2mg    Complete by: As directed    Administer for PICC line occlusion and as ordered by physician for other access device issues.   Ambulatory referral to Urology   Complete by: As directed    Anaphylaxis Kit: Provided to treat any anaphylactic reaction to the medication being provided to the patient if First Dose or when requested by physician   Complete by: As directed    Epinephrine  1mg /ml vial / amp: Administer 0.3mg  (0.86ml) subcutaneously once for moderate to severe anaphylaxis, nurse to call physician and pharmacy when reaction occurs and call 911 if needed for immediate care   Diphenhydramine  50mg /ml IV vial: Administer 25-50mg  IV/IM PRN for first dose reaction, rash, itching, mild reaction, nurse to call physician and pharmacy when reaction occurs   Sodium Chloride  0.9% NS 500ml IV: Administer if needed for hypovolemic blood pressure drop or as ordered by physician after call to physician with anaphylactic reaction   Call MD for:  difficulty breathing, headache or visual disturbances   Complete by: As  directed    Call MD for:  difficulty breathing, headache or visual disturbances   Complete by: As directed  Call MD for:  extreme fatigue   Complete by: As directed    Call MD for:  extreme fatigue   Complete by: As directed    Call MD for:  hives   Complete by: As directed    Call MD for:  hives   Complete by: As directed    Call MD for:  persistant dizziness or light-headedness   Complete by: As directed    Call MD for:  persistant dizziness or light-headedness   Complete by: As directed    Call MD for:  persistant nausea and vomiting   Complete by: As directed    Call MD for:  severe uncontrolled pain   Complete by: As directed    Call MD for:  severe uncontrolled pain   Complete by: As directed    Call MD for:  temperature >100.4   Complete by: As directed    Call MD for:  temperature >100.4   Complete by: As directed    Catheter care   Complete by: As directed    Change dressing on IV access line weekly and PRN   Complete by: As directed    Diet Carb Modified   Complete by: As directed    Diet general   Complete by: As directed    Dysphagia 2, thin liquids   Discharge instructions   Complete by: As directed    1. You are being discharged to short-term rehabilitation at [facility] 2. Continue Daptomycin /Cefepime  via PICC line through 04/13/24 (including after discharge from rehab) 3. Recommend CBC/BMP in the next 48 hours 4. Continue foley catheter; follow up with urology for voiding trial in the future 5. [facility] at 11[facility] 6. Follow up with Dr. Theophilus after discharge from rehab 7. Maintain PICC line   Flush IV access with Sodium Chloride  0.9% and Heparin  10 units/ml or 100 units/ml   Complete by: As directed    Home infusion instructions   Complete by: As directed    Instructions: Flushing of vascular access device: 0.9% NaCl pre/post medication administration and prn patency; Heparin  100 u/ml, 5ml for implanted ports  and Heparin  10u/ml, 5ml for all other central venous catheters.   Home infusion instructions - [facility]   Complete by: As directed    Instructions: Flush IV access with Sodium Chloride  0.9% and Heparin  10units/ml or 100units/ml   Change dressing on IV access line: Weekly and PRN   Instructions Cath Flo 2mg : Administer for PICC Line occlusion and as ordered by physician for other access device   [facility] pharmacist to adjust dose for: Vancomycin , Aminoglycosides and other anti-infective therapies as requested by physician   Increase activity slowly   Complete by: As directed    Increase activity slowly   Complete by: As directed    Method of administration may be changed at the discretion of home infusion pharmacist based upon assessment of the patient and/or caregivers ability to self-administer the medication ordered   Complete by: As directed    No wound care   Complete by: As directed    No wound care   Complete by: As directed       Allergies as of 03/23/2024       Reactions   Amlodipine  Nausea Only, Swelling, Other (See Comments)   Severe edema and chest pain- also   Chlorhexidine  Gluconate Itching, Other (See Comments)   Only itching with wipes, not with soap   Ativan  [lorazepam ]  Other (See Comments)   Agitation/hallucination - not a true allergy        Medication List     STOP taking these medications    atorvastatin  20 MG tablet Commonly known as: LIPITOR   cyclobenzaprine  10 MG tablet Commonly known as: FLEXERIL    oxyCODONE  5 MG immediate release tablet Commonly known as: Oxy IR/ROXICODONE    potassium chloride  SA 20 MEQ tablet Commonly known as: Klor-Con  M20       TAKE these medications    acetaminophen  325 MG tablet Commonly known as: TYLENOL  Take 2 tablets (650 mg total) by mouth every 6 (six) hours as needed for mild pain (pain score 1-3) or fever (or Fever >/= 101).   allopurinol  100 MG tablet Commonly known as:  ZYLOPRIM  Take 1 tablet (100 mg total) by mouth daily. What changed:  medication strength how much to take   apixaban  2.5 MG Tabs tablet Commonly known as: ELIQUIS  Take 1 tablet (2.5 mg total) by mouth 2 (two) times daily. What changed:  medication strength how much to take   carvedilol  3.125 MG tablet Commonly known as: COREG  TAKE 1 TABLET BY MOUTH TWICE A DAY What changed: when to take this   docusate sodium  100 MG capsule Commonly known as: Colace Take 1 capsule (100 mg total) by mouth daily as needed (please take daily while on narcotic pain medication).   ertapenem  IVPB Commonly known as: INVANZ  Inject 1 g into the vein daily for 28 days. Indication:  Psoas abscess First Dose: Yes Last Day of Therapy:  04/10/24 Labs - Once weekly:  CBC/D and BMP, Labs - Once weekly: ESR and CRP Method of administration: Mini-Bag Plus / Gravity Method of administration may be changed at the discretion of home infusion pharmacist based upon assessment of the patient and/or caregiver's ability to self-administer the medication ordered.   feeding supplement Liqd Take 237 mLs by mouth 2 (two) times daily between meals.   flecainide  50 MG tablet Commonly known as: TAMBOCOR  Take 1 tablet (50 mg total) by mouth 2 (two) times daily. What changed:  medication strength how much to take   fluticasone  50 MCG/ACT nasal spray Commonly known as: FLONASE  Place 2 sprays into both nostrils daily. What changed:  when to take this reasons to take this   folic acid  1 MG tablet Commonly known as: FOLVITE  Take 1 tablet (1 mg total) by mouth daily.   gabapentin  300 MG capsule Commonly known as: NEURONTIN  Take 1-2 capsules (300-600 mg total) by mouth See admin instructions. Take 300 mg by mouth in the morning & at noon and 600 mg at bedtime   hydrALAZINE  25 MG tablet Commonly known as: APRESOLINE  TAKE 1 TABLET (25 MG TOTAL) BY MOUTH 4 (FOUR) TIMES DAILY. What changed: when to take this    hydrocortisone  cream 1 % Apply topically 3 (three) times daily.   linezolid  600 MG tablet Commonly known as: ZYVOX  Take 1 tablet (600 mg total) by mouth every 12 (twelve) hours for 18 days.   montelukast  10 MG tablet Commonly known as: SINGULAIR  Take 10 mg by mouth at bedtime.   multivitamin with minerals Tabs tablet Take 1 tablet by mouth daily.   oxybutynin  5 MG tablet Commonly known as: DITROPAN  Take 1 tablet (5 mg total) by mouth every 8 (eight) hours as needed for bladder spasms.   potassium chloride  20 MEQ packet Commonly known as: KLOR-CON  Take 20 mEq by mouth daily. Start taking on: March 24, 2024   Restasis  0.05 %  ophthalmic emulsion Generic drug: cycloSPORINE  Place 1 drop into both eyes in the morning and at bedtime.   sodium chloride  flush 0.9 % Soln Commonly known as: NS 10-40 mLs by Intracatheter route as needed (flush).   sodium chloride  flush 0.9 % Soln Commonly known as: NS Inject 3 mLs into the vein every 12 (twelve) hours.   tamsulosin  0.4 MG Caps capsule Commonly known as: FLOMAX  Take 1 capsule (0.4 mg total) by mouth daily after supper.   torsemide  10 MG tablet Commonly known as: DEMADEX  Take 1 tablet (10 mg total) by mouth 2 (two) times daily. What changed:  medication strength how much to take when to take this               Home Infusion Instuctions  (From admission, onward)           Start     Ordered   02/28/24 0000  Home infusion instructions       Question:  Instructions  Answer:  Flushing of vascular access device: 0.9% NaCl pre/post medication administration and prn patency; Heparin  100 u/ml, 5ml for implanted ports and Heparin  10u/ml, 5ml for all other central venous catheters.   02/28/24 1444              Discharge Care Instructions  (From admission, onward)           Start     Ordered   03/21/24 0000  Change dressing on IV access line weekly and PRN  (Home infusion instructions - [facility] )        03/21/24 1215            Follow-up Information     Theophilus Andrews, Tully GRADE, MD. Schedule an appointment as soon as possible for a visit.   Specialty: Internal Medicine Why: after discharge from rehab Contact information: [address] [place] [address] [phone]         Overton Constance DASEN, MD Follow up on 03/17/2024.   Specialty: Infectious Diseases Why: 11 AM Contact information: [address] [place] [address] [phone]         [facility]. Schedule an appointment as soon as possible for a visit.   Contact information: [address] [place]  [address] [phone]               Allergies[1]  Consultations: Nephrology, GI, infectious disease, psychiatry    Procedures/Studies: DG Lumbar Spine 2-3 Views Result Date: 03/19/2024 CLINICAL DATA:  Elective surgery. History of recent falls. Numbness of legs. Recent surgery. EXAM: LUMBAR SPINE - 2-3 VIEW COMPARISON:  Lumbar spine CT 02/22/2024, radiograph 02/06/2024 FINDINGS: Technically limited due to patient's difficulty with positioning. S1 is transitional. The lateral views are significantly oblique. Posterior rod and pedicle screw fixation at L4-L5. The hardware is grossly intact. Interbody spacer in place. No evidence of acute fracture. Diffuse degenerative disc disease with multilevel facet hypertrophy, better delineated on prior CT. IMPRESSION: 1. Technically limited exam due to patient's difficulty with positioning. 2. Posterior rod and pedicle screw fixation at L4-L5. No evidence of hardware complication. 3. No evidence of acute fracture, although technically limited by positioning. 4. Multilevel degenerative disc disease and facet hypertrophy. Electronically Signed   By: Andrea Gasman M.D.   On: 03/19/2024 18:08   US  Abdomen Limited RUQ (LIVER/GB) Result Date: 03/12/2024 CLINICAL DATA:  Transaminitis EXAM: ULTRASOUND  ABDOMEN LIMITED RIGHT UPPER QUADRANT COMPARISON:  CT 11/16/2021 FINDINGS:  Gallbladder: Surgically absent Common bile duct: Diameter: 5.4 mm Liver: Slightly echogenic liver parenchyma. Small cyst in the right hepatic lobe measuring 13 mm. Portal vein is patent on color Doppler imaging with normal direction of blood flow towards the liver. Other: Small right pleural effusion incidentally noted IMPRESSION: 1. Status post cholecystectomy. No biliary dilatation. 2. Slightly echogenic liver parenchyma suggesting hepatic steatosis and or hepatocellular disease. 3. Small right pleural effusion. Electronically Signed   By: Luke Bun M.D.   On: 03/12/2024 15:37   EEG adult Result Date: 03/11/2024 Shelton Arlin KIDD, MD     03/11/2024  3:52 PM Patient Name: Charles Lloyd. MRN: [id number] Epilepsy Attending: Arlin KIDD Shelton Referring Physician/Provider: Sebastian Toribio GAILS, MD Date: 03/11/2024 Duration: 24.26 mins Patient history: 84yo M with jerking. EEG to evaluate for seizure Level of alertness: Awake AEDs during EEG study: None Technical aspects: This EEG study was done with scalp electrodes positioned according to the 10-20 International system of electrode placement. Electrical activity was reviewed with band pass filter of 1-70Hz , sensitivity of 7 uV/mm, display speed of 78mm/sec with a 60Hz  notched filter applied as appropriate. EEG data were recorded continuously and digitally stored.  Video monitoring was available and reviewed as appropriate. Description: EEG showed continuous generalized 3 to 6 Hz theta-delta slowing, triphasic morphology. Hyperventilation and photic stimulation were not performed.   ABNORMALITY - Continuous slow, generalized IMPRESSION: This study is suggestive of generalized cerebral dysfunction (encephalopathy) likely related to toxic-metabolic etiology. No seizures or epileptiform discharges were seen throughout the recording. Priyanka KIDD Shelton   CT HEAD WO CONTRAST ( ) Result Date:  03/10/2024 CLINICAL DATA:  Delirium acute change EXAM: CT HEAD WITHOUT CONTRAST TECHNIQUE: Contiguous axial images were obtained from the base of the skull through the vertex without intravenous contrast. RADIATION DOSE REDUCTION: This exam was performed according to the departmental dose-optimization program which includes automated exposure control, adjustment of the mA and/or kV according to patient size and/or use of iterative reconstruction technique. COMPARISON:  MRI 02/24/2024, CT brain 02/22/2024 FINDINGS: Brain: No acute territorial infarction, hemorrhage or intracranial mass. Moderate atrophy. Mild chronic small vessel ischemic changes of the white matter. Stable ventricular size Vascular: No hyperdense vessels.  Carotid vascular calcification Skull: Normal. Negative for fracture or focal lesion. Sinuses/Orbits: No acute finding. Other: None IMPRESSION: 1. No CT evidence for acute intracranial abnormality. 2. Atrophy and chronic small vessel ischemic changes of the white matter. Electronically Signed   By: Luke Bun M.D.   On: 03/10/2024 19:34   US  RENAL Result Date: 03/09/2024 CLINICAL DATA:  Acute kidney injury. EXAM: RENAL / URINARY TRACT ULTRASOUND COMPLETE COMPARISON:  12/30/2020 FINDINGS: Right Kidney: Renal measurements: 9.0 x 5.7 x 4.9 cm = volume: 132 mL. Echogenicity within normal limits. No mass or hydronephrosis visualized. Left Kidney: Renal measurements: 10.3 x 5.5 x 4.6 cm = volume: 136 mL. Echogenicity within normal limits. No mass or hydronephrosis visualized. Bladder: Bladder is decompressed with a Foley catheter. Other: Anechoic structure in the right hepatic lobe near the right kidney. This is most compatible with a cyst and measures 1.7 x 1.1 x 1.4 cm. IMPRESSION: 1. Normal appearance of both kidneys.  No hydronephrosis. 2. Bladder is decompressed with a Foley catheter. Electronically Signed   By: Juliene Balder M.D.   On: 03/09/2024 13:44   US  EKG SITE RITE Result Date:  02/28/2024 If Site Rite image not attached, placement could not be confirmed due to current cardiac rhythm.  CT GUIDED PERITONEAL/RETROPERITONEAL FLUID DRAIN  BY PERC CATH Result Date: 02/24/2024 INDICATION: 84 year old with a postoperative fluid collection in the left psoas muscle. EXAM: CT-guided aspiration of left psoas fluid collection MEDICATIONS: Moderate sedation ANESTHESIA/SEDATION: Moderate (conscious) sedation was employed during this procedure. A total of Versed  1mg  and fentanyl  50 mcg was administered intravenously at the order of the provider performing the procedure. Total intra-service moderate sedation time: 15 minutes. Patient's level of consciousness and vital signs were monitored continuously by radiology nurse throughout the procedure under the supervision of the provider performing the procedure. COMPLICATIONS: None immediate. PROCEDURE: Informed written consent was obtained from the patient after a thorough discussion of the procedural risks, benefits and alternatives. All questions were addressed. A timeout was performed prior to the initiation of the procedure. Patient was placed prone. CT images through the lower abdomen were obtained. Left side of the back was prepped with Betadine and sterile field was created. Skin was anesthetized using 1% lidocaine . Using CT guidance, a Yueh catheter was directed into the left psoas muscle. Initially, the needle was placed at the level of the L4 pedicle screw. However, no fluid could be aspirated. The Yueh catheter was directed more caudal into the anterior aspect of the left psoas muscle and 1 mL of bloody fluid was aspirated. Bandage placed over the puncture site. RADIATION DOSE REDUCTION: This exam was performed according to the departmental dose-optimization program which includes automated exposure control, adjustment of the mA and/or kV according to patient size and/or use of iterative reconstruction technique. FINDINGS: Bilateral interbody  fusion at L4-L5. Needle was directed into the anterior aspect of the left psoas muscle and 1 mL bloody fluid was removed. IMPRESSION: CT-guided aspiration of left psoas fluid collection. Fluid was sent for culture. Electronically Signed   By: Juliene Balder M.D.   On: 02/24/2024 22:02   MR BRAIN WO CONTRAST Result Date: 02/24/2024 EXAM: MRI BRAIN WITHOUT CONTRAST 02/24/2024 04:46:52 PM TECHNIQUE: Multiplanar multisequence MRI of the head/brain was performed without the administration of intravenous contrast. COMPARISON: CT head 02/22/2024 and earlier. CLINICAL HISTORY: Delirium. FINDINGS: BRAIN AND VENTRICLES: Mild generalized parenchymal volume loss. Mild prominence of extra spaces over the cerebral convexities, likely related to volume loss, without evidence of extra-axial fluid collection. White matter within normal limits for patient's age. No acute infarct. No intracranial hemorrhage. No mass. No midline shift. No hydrocephalus. The sella is unremarkable. Normal flow voids. ORBITS: Left lens replacement. SINUSES AND MASTOIDS: Mucosal thickening in the right maxillary sinus. Trace fluid in the mastoid air cells, right greater than left. BONES AND SOFT TISSUES: Normal marrow signal. No acute soft tissue abnormality. IMPRESSION: 1. No acute intracranial abnormality. Electronically signed by: Donnice Mania MD 02/24/2024 06:25 PM EST RP Workstation: [id number]   MR Lumbar Spine W Wo Contrast Result Date: 02/22/2024 EXAM: MRI LUMBAR SPINE 02/22/2024 06:47:20 PM TECHNIQUE: Multiplanar multisequence MRI of the lumbar spine was performed without or with the administration of intravenous contrast. COMPARISON: MRI lumbar spine 10/30/2023 CLINICAL HISTORY: Low back pain, cauda equina syndrome suspected FINDINGS: SEGMENTATION: Transitional lumbosacral anatomy. The transitional lumbosacral vertebra is assumed to represent the S1 level. This conforms to the segmentation utilized on prior exams. BONES AND ALIGNMENT: Similar  grade 1 retrolisthesis of L2 on L3 and improved grade 1 anterolisthesis of L4 on L5. Interval L4-L5 interbody fusion and PLIF. Hardware artifact limits assessment, but no obviouse bone marrow signal abnormality. SPINAL CORD: The conus terminates normally. SOFT TISSUES: New 3.5 x 2.4 cm fluid collection in the left psoas muscle at the L4-L5  level. More illdefined edema in the posterior paraspinal soft tissues with small fluid pockets is more typical of postoperative change. L1-L2 Disc height loss with degenerative endplate signal changes. Disc bulge with mild to moderate canal stenosis. Patent foramina. L2-L3 Disc bulging and endplate spurring with ligamentum flavum thickening, resulting in progressive severe canal stenosis. Bilateral facet arthropathy with progressive moderate to severe left foraminal stenosis. Similar moderate right foraminal stenosis. L3-L4 Broad disc bulge with ligamentum flavum thickening and bilateral facet arthropathy. Similar versus slightly progressive moderate to severe canal stenosis. Similar moderate left foraminal stenosis. L4-L5 Interbody fusion and PLIF.  Persistent severe canal stenosis. L5-S1 Mild disc bulge with similar borderline mild foraminal stenosis. S1-S2 Transitional anatomy. No spinal canal stenosis or neural foraminal narrowing. IMPRESSION: 1. New 3.5 x 2.4 cm fluid collection in the left psoas muscle at the L4-L5 level with surrounding edema. The location is atypical for postoperative changes and is concerning for abscess. No obvious changes of discitis/osteomyelitis but hardware artifact limits assessment. Recommend correlation with infectious markers. 2. At L2-L3, progressive severe canal stenosis and moderate to severe left foraminal stenosis. Similar moderate right foraminal stenosis. 3. At L3-L4, similar versus slightly progressive moderate to severe canal stenosis. 4. Interval fusion at L4-L5 with improved alignment but persistent severe canal stenosis. 5.  Transitional lumbosacral vertebra is assumed to represent the S1 level. This conforms to the segmentation utilized on prior exams. Electronically signed by: Gilmore Molt MD 02/22/2024 09:12 PM EST RP Workstation: [id number]   CT PELVIS WO CONTRAST Result Date: 02/22/2024 CLINICAL DATA:  Fall in shower. History of frequent falls over the past 6-8 weeks. EXAM: CT PELVIS WITHOUT CONTRAST TECHNIQUE: Multidetector CT imaging of the pelvis was performed following the standard protocol without intravenous contrast. RADIATION DOSE REDUCTION: This exam was performed according to the departmental dose-optimization program which includes automated exposure control, adjustment of the mA and/or kV according to patient size and/or use of iterative reconstruction technique. COMPARISON:  Pelvic radiographs dated 02/22/2024. CT pelvis dated 10/30/2023. FINDINGS: Bones/Joint/Cartilage No acute fracture or dislocation. Femoral heads are seated within the acetabulum. 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 Bilateral greater trochanteric enthesopathy. The sacroiliac joints and pubic symphysis are anatomically aligned with degenerative changes. Partially visualized postoperative changes of the lower lumbar spine. Soft tissue and Muscles Soft tissue swelling along the left posterolateral abdominal wall. No loculated fluid collection. Intrapelvic contents Prostate implant seeds. Descending and sigmoid colonic diverticulosis. Moderate vascular calcifications again noted. No enlarged lymph nodes identified in the field of view. IMPRESSION: 1. No acute osseous abnormality. 2. Soft tissue swelling along the left posterolateral abdominal wall. No fluid collection. 3. 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 Electronically Signed   By: Harrietta Sherry M.D.   On: 02/22/2024 16:13   CT Lumbar Spine Wo Contrast Result  Date: 02/22/2024 EXAM: CT OF THE LUMBAR SPINE WITHOUT CONTRAST 02/22/2024 03:28:05 PM TECHNIQUE: CT of the lumbar spine was performed without the administration of intravenous contrast. Multiplanar reformatted images are provided for review. Automated exposure control, iterative reconstruction, and/or weight based adjustment of the mA/kV was utilized to reduce the radiation dose to as low as reasonably achievable. COMPARISON: MRI and CT lumbar spine 10/30/2023. CLINICAL HISTORY: Back trauma, no prior imaging (Age >= 16y). FINDINGS: BONES AND ALIGNMENT: Normal vertebral body heights. No evidence of compression fracture or displaced fracture in the lumbar spine. Redemonstrated transitional anatomy at the lumbosacral junction with a partially lumbarized S1 vertebral body  and a near full size disc at the S1-S2 level. Interval PLIF at L4-L5 with bilateral pedicle screws and vertical interconnecting rods. Hardware is intact. Interbody spacer at L4-L5. There is no osseous fusion across the L4-L5 disc space on the current study. Diffuse osteopenia. Degenerative changes of the sacroiliac joints. Alignment: Trace degenerative retrolisthesis of L1 on L2 and L2 on L3. Similar grade 1 anterolisthesis of L4 on L5. DEGENERATIVE CHANGES: Disc space narrowing and vacuum disc phenomenon at multiple levels. Degenerative endplate osteophytes at multiple levels. Posterior osteophytes along with facet arthrosis at L2-L3 contributing to moderate to severe spinal canal stenosis. Additional disc bulge and facet arthrosis at L3-L4 resulting in at least moderate spinal canal stenosis, although streak artifact slightly limits evaluation. Streak artifact limits evaluation of the spinal canal at L4-L5; there is likely residual moderate to severe spinal canal stenosis at this level. SOFT TISSUES: Atherosclerosis of the abdominal aorta and branch vessels. Additional prominent atherosclerosis within the pelvis. Cholecystectomy clips.  Diverticulosis of the partially visualized sigmoid colon. IMPRESSION: 1. No evidence of acute traumatic injury. 2. Interval PLIF at L4-L5. Hardware is intact. No osseous fusion across the L4-L5 disc space. 3. 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 4. At least moderate spinal canal stenosis at L3-L4 due to disc bulge and facet arthrosis. 5. Likely residual moderate to severe spinal canal stenosis at L4-L5, evaluation limited by streak artifact. Electronically signed by: Donnice Mania MD 02/22/2024 04:04 PM EST RP Workstation: [id number]   DG Pelvis 1-2 Views Result Date: 02/22/2024 EXAM: 1 or 2 VIEW(S) XRAY OF THE PELVIS 02/22/2024 01:09:00 PM COMPARISON: None available. CLINICAL HISTORY: [id number] Fall [id number] FINDINGS: BONES AND JOINTS: No acute fracture. No malalignment. Lumbar spinal fusion hardware noted. Moderate bilateral hip degenerative changes. SOFT TISSUES: Surgical clips overlie the pelvis. Vascular calcifications noted. IMPRESSION: 1. No evidence of acute traumatic injury. Electronically signed by: Morgane Naveau MD 02/22/2024 01:32 PM EST RP Workstation: [id number]   DG Chest 1 View Result Date: 02/22/2024 EXAM: 1 VIEW(S) XRAY OF THE CHEST 02/22/2024 01:09:00 PM COMPARISON: 10/19/2022 CLINICAL HISTORY: Fall FINDINGS: LUNGS AND PLEURA: No focal pulmonary opacity. No pleural effusion. No pneumothorax. HEART AND MEDIASTINUM: Calcified aorta. BONES AND SOFT TISSUES: Thoracic degenerative changes. No acute osseous abnormality. IMPRESSION: 1. No acute process. Electronically signed by: Morgane Naveau MD 02/22/2024 01:31 PM EST RP Workstation: [id number]   CT Cervical Spine Wo Contrast Result Date: 02/22/2024 EXAM: CT CERVICAL SPINE WITHOUT CONTRAST 02/22/2024 12:58:30 PM TECHNIQUE: CT of the cervical spine was performed without the administration of intravenous contrast. Multiplanar reformatted images are provided for review. Automated  exposure control, iterative reconstruction, and/or weight based adjustment of the mA/kV was utilized to reduce the radiation dose to as low as reasonably achievable. COMPARISON: 10/30/2023 and 12/30/2020 CLINICAL HISTORY: Neck trauma (Age >= 65y) FINDINGS: BONES AND ALIGNMENT: No acute fracture or traumatic malalignment. There is straightening of the normal cervical lordosis. There is 3 mm anterolisthesis of C7 on T1 which is increased by approximately 1 mm since the 2022 study. Additional 4 mm anterolisthesis of T1 on T2 which is increased by approximately 1.5 x 2 mm since the 2022 study. DEGENERATIVE CHANGES: There is moderate to severe disc space narrowing at multiple levels in the cervical spine most pronounced at C5-C6 and C6-C7 overall similar to prior. There is severe disc space narrowing at T1-T2 which has increased from prior. Degenerative endplate osteophytes at multiple levels. Disc osteophyte complexes at multiple levels without  high grade osseous spinal canal stenosis. There is facet arthrosis and uncovertebral hypertrophy at multiple levels. SOFT TISSUES: No prevertebral soft tissue swelling. IMPRESSION: 1. No acute cervical spine fracture. 2. 3 mm anterolisthesis of C7 on T1, increased from prior. 4 mm anterolisthesis of T1 on T2, increased since 2022. Findings likely related to increased degenerative changes. Electronically signed by: Donnice Mania MD 02/22/2024 01:21 PM EST RP Workstation: [id number]   CT Head Wo Contrast Result Date: 02/22/2024 EXAM: CT HEAD WITHOUT CONTRAST 02/22/2024 12:58:30 PM TECHNIQUE: CT of the head was performed without the administration of intravenous contrast. Automated exposure control, iterative reconstruction, and/or weight based adjustment of the mA/kV was utilized to reduce the radiation dose to as low as reasonably achievable. COMPARISON: 10/30/2023 CLINICAL HISTORY: Head trauma, minor (Age >= 65y) FINDINGS: BRAIN AND VENTRICLES: No acute hemorrhage. No evidence  of acute infarct. No hydrocephalus. No extra-axial collection. No mass effect or midline shift. Patchy and confluent decreased attenuation throughout the deep and periventricular white matter of the cerebral hemispheres bilaterally, compatible with chronic microvascular ischemic disease. Cerebral ventricle sizes concordant with degree of cerebral volume loss. Atherosclerotic calcifications within the cavernous internal carotid arteries. ORBITS: No acute abnormality. Left lens replacement. SINUSES: No acute abnormality. SOFT TISSUES AND SKULL: No acute soft tissue abnormality. No skull fracture. IMPRESSION: 1. No acute intracranial abnormality. Electronically signed by: Donnice Mania MD 02/22/2024 01:14 PM EST RP Workstation: [id number]     Subjective: No acute issues/events reported overnight   Discharge Exam: Vitals:   03/22/24 2131 03/23/24 0524  BP: 126/87 (!) 143/71  Pulse: 76 76  Resp: 17 16  Temp: 97.7 F (36.5 C) 97.7 F (36.5 C)  SpO2: 94% 96%   Vitals:   03/22/24 0425 03/22/24 1319 03/22/24 2131 03/23/24 0524  BP: 135/79 134/76 126/87 (!) 143/71  Pulse: 74 70 76 76  Resp: 20 16 17 16   Temp: 97.8 F (36.6 C) 97.9 F (36.6 C) 97.7 F (36.5 C) 97.7 F (36.5 C)  TempSrc: Oral  Oral Oral  SpO2: 91% 93% 94% 96%  Weight:      Height:        General:  Pleasantly resting in bed, No acute distress.  Notable stutter and word finding difficulty HEENT:  Normocephalic atraumatic.  Sclerae nonicteric, noninjected.  Extraocular movements intact bilaterally. Neck:  Without mass or deformity.  Trachea is midline. Lungs:  Clear to auscultate bilaterally without rhonchi, wheeze, or rales. Heart:  Regular rate and rhythm.  Without murmurs, rubs, or gallops. Abdomen:  Soft, nontender, nondistended.  Without guarding or rebound. Extremities: Without cyanosis, or edema   The results of significant diagnostics from this hospitalization (including imaging, microbiology, ancillary and  laboratory) are listed below for reference.     Microbiology: No results found for this or any previous visit (from the past 240 hours).    Labs: BNP (last 3 results) No results for input(s): BNP in the last 8760 hours. Basic Metabolic Panel: Recent Labs  Lab 03/17/24 0049 03/18/24 0940 03/20/24 0540 03/21/24 0833 03/23/24 0500  NA 141 145 145 146* 151*  K 3.3* 3.7 3.6 3.6 3.0*  CL 110 113* 113* 113* 115*  CO2 23 22 22  21* 22  GLUCOSE 125* 96 73 78 98  BUN 22 21 19 19 21   CREATININE 1.57* 1.44* 1.59* 1.64* 1.56*  CALCIUM  9.3 9.4 9.0 9.3 9.2   Liver Function Tests: Recent Labs  Lab 03/17/24 0049 03/18/24 0940 03/20/24 0540 03/21/24 0833 03/23/24 0500  AST 210* 147*  80* 61* 79*  ALT 162* 147* 94* 84* 69*  ALKPHOS 80 101 74 82 76  BILITOT 0.5 0.5 0.5 0.7 0.6  PROT 4.7* 5.0* 4.9* 5.3* 5.2*  ALBUMIN  2.6* 2.5* 2.3* 2.6* 2.5*   No results for input(s): LIPASE, AMYLASE in the last 168 hours. Recent Labs  Lab 03/16/24 2104  AMMONIA 19   CBC: Recent Labs  Lab 03/17/24 0049 03/18/24 0940  WBC 3.7* 3.7*  NEUTROABS  --  1.6*  HGB 7.6* 8.1*  HCT 24.3* 26.0*  MCV 91.7 93.2  PLT 184 208   Cardiac Enzymes: Recent Labs  Lab 03/18/24 0940 03/20/24 0540 03/23/24 0500  CKTOTAL 209 82 995*   BNP: Invalid input(s): POCBNP CBG: Recent Labs  Lab 03/22/24 0725 03/22/24 1133 03/22/24 1600 03/22/24 2124 03/23/24 0715  GLUCAP 82 91 103* 93 79   D-Dimer No results for input(s): DDIMER in the last 72 hours. Hgb A1c No results for input(s): HGBA1C in the last 72 hours. Lipid Profile No results for input(s): CHOL, HDL, LDLCALC, TRIG, CHOLHDL, LDLDIRECT in the last 72 hours. Thyroid  function studies No results for input(s): TSH, T4TOTAL, T3FREE, THYROIDAB in the last 72 hours.  Invalid input(s): FREET3 Anemia work up No results for input(s): VITAMINB12, FOLATE, FERRITIN, TIBC, IRON, RETICCTPCT in the last 72  hours. Urinalysis    Component Value Date/Time   COLORURINE RED (A) 03/10/2024 1426   APPEARANCEUR (A) 03/10/2024 1426    TEST NOT REPORTED DUE TO COLOR INTERFERENCE OF URINE PIGMENT   LABSPEC  03/10/2024 1426    TEST NOT REPORTED DUE TO COLOR INTERFERENCE OF URINE PIGMENT   PHURINE  03/10/2024 1426    TEST NOT REPORTED DUE TO COLOR INTERFERENCE OF URINE PIGMENT   GLUCOSEU (A) 03/10/2024 1426    TEST NOT REPORTED DUE TO COLOR INTERFERENCE OF URINE PIGMENT   HGBUR (A) 03/10/2024 1426    TEST NOT REPORTED DUE TO COLOR INTERFERENCE OF URINE PIGMENT   BILIRUBINUR (A) 03/10/2024 1426    TEST NOT REPORTED DUE TO COLOR INTERFERENCE OF URINE PIGMENT   BILIRUBINUR 1+ 07/12/2022 1120   KETONESUR (A) 03/10/2024 1426    TEST NOT REPORTED DUE TO COLOR INTERFERENCE OF URINE PIGMENT   PROTEINUR (A) 03/10/2024 1426    TEST NOT REPORTED DUE TO COLOR INTERFERENCE OF URINE PIGMENT   UROBILINOGEN negative (A) 07/12/2022 1120   NITRITE (A) 03/10/2024 1426    TEST NOT REPORTED DUE TO COLOR INTERFERENCE OF URINE PIGMENT   LEUKOCYTESUR (A) 03/10/2024 1426    TEST NOT REPORTED DUE TO COLOR INTERFERENCE OF URINE PIGMENT   Sepsis Labs Recent Labs  Lab 03/17/24 0049 03/18/24 0940  WBC 3.7* 3.7*   Microbiology No results found for this or any previous visit (from the past 240 hours).    Time coordinating discharge: Over 30 minutes  SIGNED:   Elsie JAYSON Montclair, DO [facility] 03/23/2024, 11:21 AM Pager   If 7PM-7AM, please contact night-coverage [email or web address]       [1]  Allergies Allergen Reactions   Amlodipine  Nausea Only, Swelling and Other (See Comments)    Severe edema and chest pain- also   Chlorhexidine  Gluconate Itching and Other (See Comments)    Only itching with wipes, not with soap   Ativan  [Lorazepam ] Other (See Comments)    Agitation/hallucination - not a true allergy

## 2024-03-23 NOTE — Plan of Care (Signed)

## 2024-03-23 NOTE — TOC Progression Note (Signed)
 Transition of Care (TOC) - Progression Note    Patient Details  Name: Charles Lloyd. MRN: [id number] Date of Birth: March 31, 1940  Transition of Care [facility] CM/SW Contact  [name], RN Phone [phone]  03/23/2024, 9:16 AM  Clinical Narrative:    CM spoke with [facility] admissions director at [facility] needs to review patient information, check bed availability and will call CM back with decision on bed offer.    Expected Discharge Plan: Skilled Nursing Facility Barriers to Discharge: Continued Medical Work up               Expected Discharge Plan and Services In-house Referral: NA Discharge Planning Services: CM Consult Post Acute Care Choice: Skilled Nursing Facility Living arrangements for the past 2 months: Single Family Home Expected Discharge Date: 03/21/24                                     Social Drivers of Health (SDOH) Interventions SDOH Screenings   Food Insecurity: No Food Insecurity (02/23/2024)  Housing: Low Risk (02/23/2024)  Transportation Needs: No Transportation Needs (02/23/2024)  Utilities: Not At Risk (02/23/2024)  Alcohol  Screen: Low Risk (12/21/2022)  Depression (PHQ2-9): Low Risk (02/11/2024)  Financial Resource Strain: Low Risk (12/21/2022)  Physical Activity: Inactive (12/21/2022)  Social Connections: Socially Isolated (02/23/2024)  Stress: No Stress Concern Present (12/21/2022)  Tobacco Use: Low Risk (02/22/2024)  Health Literacy: Adequate Health Literacy (12/21/2022)    Readmission Risk Interventions    02/26/2024    2:28 PM 08/08/2021   10:00 AM  Readmission Risk Prevention Plan  Transportation Screening Complete Complete  PCP or Specialist Appt within 3-5 Days Complete   HRI or Home Care Consult Complete   Social Work Consult for Recovery Care Planning/Counseling Complete   Palliative Care Screening Not Applicable   Medication Review Oceanographer) Complete Complete  PCP or Specialist  appointment within 3-5 days of discharge  Complete  HRI or Home Care Consult  Complete  SW Recovery Care/Counseling Consult  Not Complete  Palliative Care Screening  Not Applicable  Skilled Nursing Facility  Not Applicable

## 2024-03-24 DIAGNOSIS — K6812 Psoas muscle abscess: Secondary | ICD-10-CM | POA: Diagnosis not present

## 2024-03-24 LAB — GLUCOSE, CAPILLARY
Glucose-Capillary: 85 mg/dL (ref 70–99)
Glucose-Capillary: 86 mg/dL (ref 70–99)
Glucose-Capillary: 81 mg/dL (ref 70–99)
Glucose-Capillary: 100 mg/dL — ABNORMAL HIGH (ref 70–99)

## 2024-03-24 NOTE — TOC Progression Note (Addendum)
 Transition of Care (TOC) - Progression Note    Patient Details  Name: Charles Lloyd. MRN: [id number] Date of Birth: 17-Feb-1941  Transition [facility] Care [facility]) CM/SW Contact  [name], RN Phone Number:[phone]  03/24/2024, 10:09 AM  Clinical Narrative:    CM has reached out to [facility] Admissions director for [facility]. Currently there is no answer. Message has been left.  [facility] has declined patient for bed offer. Son on unit and updated. Son requesting CM to reach out to [facility] as first choice and [facility] as second choice.   1254 [facility] has no beds available and [facility] have no beds available. CM to update son.   1314 CM has updated son.  CM to reach out to [facility] and [facility] for bed availability.   [address] [facility] has no beds available, awaiting [employer] decision.   Expected Discharge Plan: Skilled Nursing Facility Barriers to Discharge: Continued Medical Work up               Expected Discharge Plan and Services In-house Referral: NA Discharge Planning Services: CM Consult Post Acute Care Choice: Skilled Nursing Facility Living arrangements for the past 2 months: Single Family Home Expected Discharge Date: 03/21/24                                     Social Drivers of Health (SDOH) Interventions SDOH Screenings   Food Insecurity: No Food Insecurity (02/23/2024)  Housing: Low Risk (02/23/2024)  Transportation Needs: No Transportation Needs (02/23/2024)  Utilities: Not At Risk (02/23/2024)  Alcohol  Screen: Low Risk (12/21/2022)  Depression (PHQ2-9): Low Risk (02/11/2024)  Financial Resource Strain: Low Risk (12/21/2022)  Physical Activity: Inactive (12/21/2022)  Social Connections: Socially Isolated (02/23/2024)  Stress: No Stress Concern Present (12/21/2022)  Tobacco Use: Low Risk (02/22/2024)  Health Literacy: Adequate Health Literacy (12/21/2022)    Readmission Risk Interventions     02/26/2024    2:28 PM 08/08/2021   10:00 AM  Readmission Risk Prevention Plan  Transportation Screening Complete Complete  PCP or Specialist Appt within 3-5 Days Complete   HRI or Home Care Consult Complete   Social Work Consult for Recovery Care Planning/Counseling Complete   Palliative Care Screening Not Applicable   Medication Review Oceanographer) Complete Complete  PCP or Specialist appointment within 3-5 days of discharge  Complete  HRI or Home Care Consult  Complete  SW Recovery Care/Counseling Consult  Not Complete  Palliative Care Screening  Not Applicable  Skilled Nursing Facility  Not Applicable

## 2024-03-24 NOTE — Progress Notes (Signed)
 Physical Therapy Treatment Patient Details Name: Charles Lloyd. MRN: [id number] DOB: [birth year] Today's Date: 03/24/2024   History of Present Illness Charles Devincent. is a 84 y.o. male who presented to the emergency department on 02/22/2024 for leg pain and weakness, with family reporting multiple falls over the past 6 to 8 months.  MRI lumbar spine revealed a new 3.5 x 2.4 cm fluid collection in the left psoas muscle concerning for abscess.  Neurosurgery was consulted at that time and recommended IV antibiotics and possible aspiration of abscess.  with significant PMH of hypertension, hyperlipidemia, type 2 diabetes, atrial fibrillation on anticoagulation, s/p recent L4-5 trans psoas lumbar interbody fusion left-sided approach on 02/06/2024, CHF,gout,CAD, TKA with infection, neuropathy    PT Comments  The patient is alert today, appears to attempt to speak, did follow  simple gesture directions to assist moving legs to sto bed edge. Patient  assisted to sitting onto bed edge with max assist, did attempt standing x 1 but poor performance to bear weight. Assisted back into bed.  Patient will benefit from continued inpatient follow up therapy, <3 hours/day.    If plan is discharge home, recommend the following: Assist for transportation;Help with stairs or ramp for entrance;Assistance with cooking/housework;A lot of help with bathing/dressing/bathroom;Two people to help with walking and/or transfers   Can travel by private vehicle        Equipment Recommendations  None recommended by PT    Recommendations for Other Services       Precautions / Restrictions Precautions Precautions: Fall Recall of Precautions/Restrictions: Impaired Precaution/Restrictions Comments: trunk flexion at baseline Restrictions Weight Bearing Restrictions Per Provider Order: No     Mobility  Bed Mobility Overal bed mobility: Needs Assistance Bed Mobility: Supine to Sit, Sit to Supine     Supine to  sit: +2 for physical assistance, +2 for safety/equipment, Total assist Sit to supine: Total assist, +2 for physical assistance, +2 for safety/equipment   General bed mobility comments: patient able  to initiate moving the legs to bed edge, required max assist to complete sitting up.  max /total assist back to supine    Transfers Overall transfer level: Needs assistance Equipment used: Rolling walker (2 wheels) Transfers: Sit to/from Stand             General transfer comment: attempted standing at RW, does not f=grip the RW, unable to clear buttocks.    Ambulation/Gait                   Stairs             Wheelchair Mobility     Tilt Bed    Modified Rankin (Stroke Patients Only)       Balance   Sitting-balance support: Feet supported, Bilateral upper extremity supported Sitting balance-Leahy Scale: Poor   Postural control: Posterior lean Standing balance support: Bilateral upper extremity supported, During functional activity, Reliant on assistive device for balance Standing balance-Leahy Scale: Zero                              Communication    Cognition Arousal: Alert Behavior During Therapy: Flat affect                           PT - Cognition Comments: patient awake  and looks at therapists, atempts to speak but does not express self  Cueing    Exercises      General Comments        Pertinent Vitals/Pain Pain Assessment Pain Assessment: PAINAD Breathing: occasional labored breathing, short period of hyperventilation Negative Vocalization: occasional moan/groan, low speech, negative/disapproving quality Facial Expression: sad, frightened, frown Body Language: tense, distressed pacing, fidgeting Consolability: no need to console PAINAD Score: 4 Pain Location: generalized, back when boosting in bed. Pain Descriptors / Indicators: Grimacing, Discomfort Pain Intervention(s): Monitored during session     Home Living                          Prior Function            PT Goals (current goals can now be found in the care plan section) Progress towards PT goals: Progressing toward goals    Frequency           PT Plan      Co-evaluation              AM-PAC PT 6 Clicks Mobility   Outcome Measure  Help needed turning from your back to your side while in a flat bed without using bedrails?: Total Help needed moving from lying on your back to sitting on the side of a flat bed without using bedrails?: Total Help needed moving to and from a bed to a chair (including a wheelchair)?: Total Help needed standing up from a chair using your arms (e.g., wheelchair or bedside chair)?: Total Help needed to walk in hospital room?: Total Help needed climbing 3-5 steps with a railing? : Total 6 Click Score: 6    End of Session Equipment Utilized During Treatment: Gait belt Activity Tolerance: Patient tolerated treatment well Patient left: in bed;with call bell/phone within reach;with bed alarm set Nurse Communication: Mobility status;Need for lift equipment PT Visit Diagnosis: Muscle weakness (generalized) (M62.81);Difficulty in walking, not elsewhere classified (R26.2)     Time: 8479-8467 PT Time Calculation (min) (ACUTE ONLY): 12 min  Charges:    $Therapeutic Activity: 8-22 mins PT General Charges $$ ACUTE PT VISIT: 1 Visit                     Darice Potters PT Acute Rehabilitation Services Office [phone]    Potters Darice Norris 03/24/2024, 5:11 PM

## 2024-03-24 NOTE — Plan of Care (Signed)
   Problem: Fluid Volume: Goal: Ability to maintain a balanced intake and output will improve Outcome: Progressing   Problem: Health Behavior/Discharge Planning: Goal: Ability to identify and utilize available resources and services will improve Outcome: Progressing Goal: Ability to manage health-related needs will improve Outcome: Progressing

## 2024-03-24 NOTE — Progress Notes (Signed)
 Patient lethargic and requires lots of stimulation and direction to swallow meds in applesauce.

## 2024-03-24 NOTE — Discharge Summary (Signed)
 Physician Discharge Summary  Charles Lloyd. 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 He was found to have a 3.5 x 2.4 cm L psoas muscle abscess. Neurosurgery recommended IR drainage and IV antibiotics. Underwent CT-guided aspiration on 02/24/24. Developed hospital-associated delirium requiring ICU transfer, now improved. Currently on antibiotics through 04/10/2024 as per ID recommendations. Psychiatry consulted on 03/06/2024 for intermittent agitation and as per family request. Palliative care also consulted for goals of care discussion.   Patient remains medically stable for discharge at this point - mental status (while waxing and waning) has continued to stabilize with poorly intelligible speech but able to follow commands. Will likely need prolonged therapy in regards to speech and physical/occupational - discharge to SNF today given insurance Auth and bed approved.  Discharged 03/21/24 given insurance auth and bed availability - facility was unavailable/did not return calls to arrange transport to facility.  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 - Patient seen in consultation by IR and patient underwent CT-guided aspiration of left psoas fluid collection with drain placement. - Cultures remain negative - Infectious disease following, transition back transition back to daptomycin  given resolution of CK, add ertapenem  through 04/10/2024    Acute metabolic encephalopathy/delirium/alcohol  withdrawal syndrome with delirium/possible underlying dementia Cannot rule out polypharmacy - Patient baseline sharp as attack per son - Initial agitation likely in the setting of infection - Subsequent medications including benzodiazepine, Precedex , phenobarbital  with no real improvement in patient's mental status and ongoing agitation -given age polypharmacy is of concern - Subsequent renal dysfunction and liver dysfunction in the setting of multiple medications and infection likely contributing as well - MRI 02/24/2024 negative, repeat CT head on 03/10/2024 negative for acute process - Labs wholly unremarkable including ammonia, B12, TSH,, TSH, - EEG unrevealing -consistent with metabolic/toxic etiology - Psychiatry consulted per family request 03/06/2024 -initiated on Rozerem  with subsequent somnolence somnolence somnolence and stupor with questionable tremors and poor p.o. intake -subsequently discontinued - Long longstanding gabapentin  also discontinued in this timeframe - Continue to follow patient clinically given ongoing improvement, although mild, over the past few days -continue advance diet as tolerated   Acute kidney injury,  resolving Hypovolemic hyponatremia, resolved Metabolic acidosis - Likely multifactorial in setting of poor p.o. 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 intake and IV fluids - ANA, ANCA -within normal limits, kappa lambda light chains noted to be mildly elevated but ratio within normal limits - Nephrology was following but have signed off.  - Bicarb tablets discontinued.    Elevated LFTs, resolving -Likely multifactorial similar to a late AKI as above in the setting of poor p.o. intake, hypovolemia, hypotension and medication injury - Hepatitis panel negative other than reactive hepatitis C antibody(false positive given subsequent confirmatory tests negative) - Imaging unremarkable other than other than hepatic steatosis and slightly echogenic liver, nonspecific - Statin discontinued - Restart daptomycin  given discussion with ID, vancomycin  discontinued - GI previously consulted with concern for drug-induced liver injury    Paroxysmal atrial fibrillation - Continue Coreg  and flecainide  for rate control. - Eliquis  for anticoagulation.   Chronic diastolic heart failure/hypertension/hyperlipidemia - Continue hydralazine , Coreg . 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  - Continue to hold gabapentin  secondary to somnolence/drowsiness.   Asthma -Stable, not in acute exacerbation. - Continue montelukast .   Recurrent falls -PT/OT. - Tentative plan for discharge to SNF   Acute on chronic urinary retention - Patient noted to have failed voiding trial on 03/04/2024 and Foley catheter had to be placed on 03/04/2024. - Continue Flomax . - Plan to discharge with Foley catheter in place with outpatient follow-up with urology.    Moderate malnutrition - advance diet as tolerated Anemia of chronic disease - Hemoglobin stable   Pressure injury, POA Wound 02/24/24 1245 Pressure Injury Buttocks Mid;Lower Deep Tissue Pressure Injury - Purple or maroon localized area of discolored intact skin or blood-filled blister due to damage of underlying soft tissue from pressure and/or shear. (Active)     Wound 02/24/24 1245 Pressure Injury Scrotum Circumferential Deep Tissue Pressure Injury - Purple or maroon localized area of discolored intact skin or blood-filled blister due to damage of underlying soft tissue from pressure and/or shear. (Active)      Discharge Instructions  Discharge Instructions     [facility] pharmacist to adjust dose for Vancomycin , Aminoglycosides and other anti-infective therapies as requested by physician.   Complete by: As directed    [facility] to provide Cath Flo 2mg    Complete by: As directed    Administer for PICC line occlusion and as ordered by physician for other access device issues.   Ambulatory referral to Urology   Complete by: As directed    Anaphylaxis Kit: Provided to treat any anaphylactic reaction to the medication being provided to the patient if First Dose or when requested by physician   Complete by: As directed    Epinephrine  1mg /ml vial / amp: Administer 0.3mg  (0.50ml) subcutaneously once for moderate to severe anaphylaxis, nurse to call physician and pharmacy when reaction occurs and call 911 if needed for immediate care   Diphenhydramine  50mg /ml IV vial: Administer 25-50mg  IV/IM PRN for first dose reaction, rash, itching, mild reaction, nurse to call physician and pharmacy when reaction occurs   Sodium Chloride  0.9% NS 500ml IV: Administer if needed for hypovolemic blood pressure drop or as ordered by physician after call to physician with anaphylactic reaction   Call MD for:  difficulty breathing, headache or visual disturbances   Complete by: As  directed    Call MD for:  difficulty breathing, headache or visual disturbances   Complete by: As directed  Call MD for:  extreme fatigue   Complete by: As directed    Call MD for:  extreme fatigue   Complete by: As directed    Call MD for:  hives   Complete by: As directed    Call MD for:  hives   Complete by: As directed    Call MD for:  persistant dizziness or light-headedness   Complete by: As directed    Call MD for:  persistant dizziness or light-headedness   Complete by: As directed    Call MD for:  persistant nausea and vomiting   Complete by: As directed    Call MD for:  severe uncontrolled pain   Complete by: As directed    Call MD for:  severe uncontrolled pain   Complete by: As directed    Call MD for:  temperature >100.4   Complete by: As directed    Call MD for:  temperature >100.4   Complete by: As directed    Catheter care   Complete by: As directed    Change dressing on IV access line weekly and PRN   Complete by: As directed    Diet Carb Modified   Complete by: As directed    Diet general   Complete by: As directed    Dysphagia 2, thin liquids   Discharge instructions   Complete by: As directed    1. You are being discharged to short-term rehabilitation at [facility] 2. Continue Daptomycin /Cefepime  via PICC line through 04/13/24 (including after discharge from rehab) 3. Recommend CBC/BMP in the next 48 hours 4. Continue foley catheter; follow up with urology for voiding trial in the future 5. [facility] at 11[facility] 6. Follow up with Dr. Theophilus after discharge from rehab 7. Maintain PICC line   Flush IV access with Sodium Chloride  0.9% and Heparin  10 units/ml or 100 units/ml   Complete by: As directed    Home infusion instructions   Complete by: As directed    Instructions: Flushing of vascular access device: 0.9% NaCl pre/post medication administration and prn patency; Heparin  100 u/ml, 5ml for implanted ports  and Heparin  10u/ml, 5ml for all other central venous catheters.   Home infusion instructions - [facility]   Complete by: As directed    Instructions: Flush IV access with Sodium Chloride  0.9% and Heparin  10units/ml or 100units/ml   Change dressing on IV access line: Weekly and PRN   Instructions Cath Flo 2mg : Administer for PICC Line occlusion and as ordered by physician for other access device   [facility] pharmacist to adjust dose for: Vancomycin , Aminoglycosides and other anti-infective therapies as requested by physician   Increase activity slowly   Complete by: As directed    Increase activity slowly   Complete by: As directed    Method of administration may be changed at the discretion of home infusion pharmacist based upon assessment of the patient and/or caregivers ability to self-administer the medication ordered   Complete by: As directed    No wound care   Complete by: As directed    No wound care   Complete by: As directed       Allergies as of 03/24/2024       Reactions   Amlodipine  Nausea Only, Swelling, Other (See Comments)   Severe edema and chest pain- also   Chlorhexidine  Gluconate Itching, Other (See Comments)   Only itching with wipes, not with soap   Ativan  [lorazepam ]  Other (See Comments)   Agitation/hallucination - not a true allergy        Medication List     STOP taking these medications    atorvastatin  20 MG tablet Commonly known as: LIPITOR   cyclobenzaprine  10 MG tablet Commonly known as: FLEXERIL    oxyCODONE  5 MG immediate release tablet Commonly known as: Oxy IR/ROXICODONE    potassium chloride  SA 20 MEQ tablet Commonly known as: Klor-Con  M20       TAKE these medications    acetaminophen  325 MG tablet Commonly known as: TYLENOL  Take 2 tablets (650 mg total) by mouth every 6 (six) hours as needed for mild pain (pain score 1-3) or fever (or Fever >/= 101).   allopurinol  100 MG tablet Commonly known as:  ZYLOPRIM  Take 1 tablet (100 mg total) by mouth daily. What changed:  medication strength how much to take   apixaban  2.5 MG Tabs tablet Commonly known as: ELIQUIS  Take 1 tablet (2.5 mg total) by mouth 2 (two) times daily. What changed:  medication strength how much to take   carvedilol  3.125 MG tablet Commonly known as: COREG  TAKE 1 TABLET BY MOUTH TWICE A DAY What changed: when to take this   docusate sodium  100 MG capsule Commonly known as: Colace Take 1 capsule (100 mg total) by mouth daily as needed (please take daily while on narcotic pain medication).   ertapenem  IVPB Commonly known as: INVANZ  Inject 1 g into the vein daily for 28 days. Indication:  Psoas abscess First Dose: Yes Last Day of Therapy:  04/10/24 Labs - Once weekly:  CBC/D and BMP, Labs - Once weekly: ESR and CRP Method of administration: Mini-Bag Plus / Gravity Method of administration may be changed at the discretion of home infusion pharmacist based upon assessment of the patient and/or caregiver's ability to self-administer the medication ordered.   feeding supplement Liqd Take 237 mLs by mouth 2 (two) times daily between meals.   flecainide  50 MG tablet Commonly known as: TAMBOCOR  Take 1 tablet (50 mg total) by mouth 2 (two) times daily. What changed:  medication strength how much to take   fluticasone  50 MCG/ACT nasal spray Commonly known as: FLONASE  Place 2 sprays into both nostrils daily. What changed:  when to take this reasons to take this   folic acid  1 MG tablet Commonly known as: FOLVITE  Take 1 tablet (1 mg total) by mouth daily.   gabapentin  300 MG capsule Commonly known as: NEURONTIN  Take 1-2 capsules (300-600 mg total) by mouth See admin instructions. Take 300 mg by mouth in the morning & at noon and 600 mg at bedtime   hydrALAZINE  25 MG tablet Commonly known as: APRESOLINE  TAKE 1 TABLET (25 MG TOTAL) BY MOUTH 4 (FOUR) TIMES DAILY. What changed: when to take this    hydrocortisone  cream 1 % Apply topically 3 (three) times daily.   linezolid  600 MG tablet Commonly known as: ZYVOX  Take 1 tablet (600 mg total) by mouth every 12 (twelve) hours for 18 days.   montelukast  10 MG tablet Commonly known as: SINGULAIR  Take 10 mg by mouth at bedtime.   multivitamin with minerals Tabs tablet Take 1 tablet by mouth daily.   oxybutynin  5 MG tablet Commonly known as: DITROPAN  Take 1 tablet (5 mg total) by mouth every 8 (eight) hours as needed for bladder spasms.   potassium chloride  20 MEQ packet Commonly known as: KLOR-CON  Take 20 mEq by mouth daily.   Restasis  0.05 % ophthalmic emulsion Generic drug: cycloSPORINE  Place  1 drop into both eyes in the morning and at bedtime.   sodium chloride  flush 0.9 % Soln Commonly known as: NS 10-40 mLs by Intracatheter route as needed (flush).   sodium chloride  flush 0.9 % Soln Commonly known as: NS Inject 3 mLs into the vein every 12 (twelve) hours.   tamsulosin  0.4 MG Caps capsule Commonly known as: FLOMAX  Take 1 capsule (0.4 mg total) by mouth daily after supper.   torsemide  10 MG tablet Commonly known as: DEMADEX  Take 1 tablet (10 mg total) by mouth 2 (two) times daily. What changed:  medication strength how much to take when to take this               Home Infusion Instuctions  (From admission, onward)           Start     Ordered   02/28/24 0000  Home infusion instructions       Question:  Instructions  Answer:  Flushing of vascular access device: 0.9% NaCl pre/post medication administration and prn patency; Heparin  100 u/ml, 5ml for implanted ports and Heparin  10u/ml, 5ml for all other central venous catheters.   02/28/24 1444              Discharge Care Instructions  (From admission, onward)           Start     Ordered   03/21/24 0000  Change dressing on IV access line weekly and PRN  (Home infusion instructions - [facility] )        03/21/24 1215             Follow-up Information     Theophilus Andrews, Tully GRADE, MD. Schedule an appointment as soon as possible for a visit.   Specialty: Internal Medicine Why: after discharge from rehab Contact information: [address] [place] [address] [phone]         [place] Constance DASEN, MD Follow up on 03/17/2024.   Specialty: Infectious Diseases Why: 11 AM Contact information: [address] [place] [address] [phone]         [facility]. Schedule an appointment as soon as possible for a visit.   Contact information: [address] [place]  [address] [phone]               Allergies[1]  Consultations: Nephrology, GI, infectious disease, psychiatry    Procedures/Studies: DG Lumbar Spine 2-3 Views Result Date: 03/19/2024 CLINICAL DATA:  Elective surgery. History of recent falls. Numbness of legs. Recent surgery. EXAM: LUMBAR SPINE - 2-3 VIEW COMPARISON:  Lumbar spine CT 02/22/2024, radiograph 02/06/2024 FINDINGS: Technically limited due to patient's difficulty with positioning. S1 is transitional. The lateral views are significantly oblique. Posterior rod and pedicle screw fixation at L4-L5. The hardware is grossly intact. Interbody spacer in place. No evidence of acute fracture. Diffuse degenerative disc disease with multilevel facet hypertrophy, better delineated on prior CT. IMPRESSION: 1. Technically limited exam due to patient's difficulty with positioning. 2. Posterior rod and pedicle screw fixation at L4-L5. No evidence of hardware complication. 3. No evidence of acute fracture, although technically limited by positioning. 4. Multilevel degenerative disc disease and facet hypertrophy. Electronically Signed   By: Andrea Gasman M.D.   On: 03/19/2024 18:08   US  Abdomen Limited RUQ (LIVER/GB) Result Date: 03/12/2024 CLINICAL DATA:  Transaminitis EXAM: ULTRASOUND ABDOMEN LIMITED RIGHT UPPER QUADRANT  COMPARISON:  CT 11/16/2021 FINDINGS: Gallbladder: Surgically absent Common bile duct:  Diameter: 5.4 mm Liver: Slightly echogenic liver parenchyma. Small cyst in the right hepatic lobe measuring 13 mm. Portal vein is patent on color Doppler imaging with normal direction of blood flow towards the liver. Other: Small right pleural effusion incidentally noted IMPRESSION: 1. Status post cholecystectomy. No biliary dilatation. 2. Slightly echogenic liver parenchyma suggesting hepatic steatosis and or hepatocellular disease. 3. Small right pleural effusion. Electronically Signed   By: Luke Bun M.D.   On: 03/12/2024 15:37   EEG adult Result Date: 03/11/2024 Shelton Arlin KIDD, MD     03/11/2024  3:52 PM Patient Name: Charles Lloyd. MRN: [id number] Epilepsy Attending: Arlin KIDD Shelton Referring Physician/Provider: Sebastian Toribio GAILS, MD Date: 03/11/2024 Duration: 24.26 mins Patient history: 84yo M with jerking. EEG to evaluate for seizure Level of alertness: Awake AEDs during EEG study: None Technical aspects: This EEG study was done with scalp electrodes positioned according to the 10-20 International system of electrode placement. Electrical activity was reviewed with band pass filter of 1-70Hz , sensitivity of 7 uV/mm, display speed of 63mm/sec with a 60Hz  notched filter applied as appropriate. EEG data were recorded continuously and digitally stored.  Video monitoring was available and reviewed as appropriate. Description: EEG showed continuous generalized 3 to 6 Hz theta-delta slowing, triphasic morphology. Hyperventilation and photic stimulation were not performed.   ABNORMALITY - Continuous slow, generalized IMPRESSION: This study is suggestive of generalized cerebral dysfunction (encephalopathy) likely related to toxic-metabolic etiology. No seizures or epileptiform discharges were seen throughout the recording. Priyanka KIDD Shelton   CT HEAD WO CONTRAST ( ) Result Date: 03/10/2024 CLINICAL DATA:  Delirium  acute change EXAM: CT HEAD WITHOUT CONTRAST TECHNIQUE: Contiguous axial images were obtained from the base of the skull through the vertex without intravenous contrast. RADIATION DOSE REDUCTION: This exam was performed according to the departmental dose-optimization program which includes automated exposure control, adjustment of the mA and/or kV according to patient size and/or use of iterative reconstruction technique. COMPARISON:  MRI 02/24/2024, CT brain 02/22/2024 FINDINGS: Brain: No acute territorial infarction, hemorrhage or intracranial mass. Moderate atrophy. Mild chronic small vessel ischemic changes of the white matter. Stable ventricular size Vascular: No hyperdense vessels.  Carotid vascular calcification Skull: Normal. Negative for fracture or focal lesion. Sinuses/Orbits: No acute finding. Other: None IMPRESSION: 1. No CT evidence for acute intracranial abnormality. 2. Atrophy and chronic small vessel ischemic changes of the white matter. Electronically Signed   By: Luke Bun M.D.   On: 03/10/2024 19:34   US  RENAL Result Date: 03/09/2024 CLINICAL DATA:  Acute kidney injury. EXAM: RENAL / URINARY TRACT ULTRASOUND COMPLETE COMPARISON:  12/30/2020 FINDINGS: Right Kidney: Renal measurements: 9.0 x 5.7 x 4.9 cm = volume: 132 mL. Echogenicity within normal limits. No mass or hydronephrosis visualized. Left Kidney: Renal measurements: 10.3 x 5.5 x 4.6 cm = volume: 136 mL. Echogenicity within normal limits. No mass or hydronephrosis visualized. Bladder: Bladder is decompressed with a Foley catheter. Other: Anechoic structure in the right hepatic lobe near the right kidney. This is most compatible with a cyst and measures 1.7 x 1.1 x 1.4 cm. IMPRESSION: 1. Normal appearance of both kidneys.  No hydronephrosis. 2. Bladder is decompressed with a Foley catheter. Electronically Signed   By: Juliene Balder M.D.   On: 03/09/2024 13:44   US  EKG SITE RITE Result Date: 02/28/2024 If Site Rite image not attached,  placement could not be confirmed due to current cardiac rhythm.  CT GUIDED PERITONEAL/RETROPERITONEAL FLUID DRAIN BY [facility] CATH Result Date: 02/24/2024  INDICATION: 84 year old with a postoperative fluid collection in the left psoas muscle. EXAM: CT-guided aspiration of left psoas fluid collection MEDICATIONS: Moderate sedation ANESTHESIA/SEDATION: Moderate (conscious) sedation was employed during this procedure. A total of Versed  1mg  and fentanyl  50 mcg was administered intravenously at the order of the provider performing the procedure. Total intra-service moderate sedation time: 15 minutes. Patient's level of consciousness and vital signs were monitored continuously by radiology nurse throughout the procedure under the supervision of the provider performing the procedure. COMPLICATIONS: None immediate. PROCEDURE: Informed written consent was obtained from the patient after a thorough discussion of the procedural risks, benefits and alternatives. All questions were addressed. A timeout was performed prior to the initiation of the procedure. Patient was placed prone. CT images through the lower abdomen were obtained. Left side of the back was prepped with Betadine and sterile field was created. Skin was anesthetized using 1% lidocaine . Using CT guidance, a Yueh catheter was directed into the left psoas muscle. Initially, the needle was placed at the level of the L4 pedicle screw. However, no fluid could be aspirated. The Yueh catheter was directed more caudal into the anterior aspect of the left psoas muscle and 1 mL of bloody fluid was aspirated. Bandage placed over the puncture site. RADIATION DOSE REDUCTION: This exam was performed according to the departmental dose-optimization program which includes automated exposure control, adjustment of the mA and/or kV according to patient size and/or use of iterative reconstruction technique. FINDINGS: Bilateral interbody fusion at L4-L5. Needle was directed into the  anterior aspect of the left psoas muscle and 1 mL bloody fluid was removed. IMPRESSION: CT-guided aspiration of left psoas fluid collection. Fluid was sent for culture. Electronically Signed   By: Juliene Balder M.D.   On: 02/24/2024 22:02   MR BRAIN WO CONTRAST Result Date: 02/24/2024 EXAM: MRI BRAIN WITHOUT CONTRAST 02/24/2024 04:46:52 PM TECHNIQUE: Multiplanar multisequence MRI of the head/brain was performed without the administration of intravenous contrast. COMPARISON: CT head 02/22/2024 and earlier. CLINICAL HISTORY: Delirium. FINDINGS: BRAIN AND VENTRICLES: Mild generalized parenchymal volume loss. Mild prominence of extra spaces over the cerebral convexities, likely related to volume loss, without evidence of extra-axial fluid collection. White matter within normal limits for patient's age. No acute infarct. No intracranial hemorrhage. No mass. No midline shift. No hydrocephalus. The sella is unremarkable. Normal flow voids. ORBITS: Left lens replacement. SINUSES AND MASTOIDS: Mucosal thickening in the right maxillary sinus. Trace fluid in the mastoid air cells, right greater than left. BONES AND SOFT TISSUES: Normal marrow signal. No acute soft tissue abnormality. IMPRESSION: 1. No acute intracranial abnormality. Electronically signed by: Donnice Mania MD 02/24/2024 06:25 PM EST RP Workstation: [id number]     Subjective: No acute issues/events reported overnight   Discharge Exam: Vitals:   03/23/24 2147 03/24/24 0648  BP: (!) 86/68 107/81  Pulse: 67   Resp: 18 18  Temp: 98 F (36.7 C) 98.3 F (36.8 C)  SpO2: 98% 94%   Vitals:   03/23/24 1339 03/23/24 1400 03/23/24 2147 03/24/24 0648  BP: 120/78  (!) 86/68 107/81  Pulse: (!) 147 89 67   Resp: 18  18 18   Temp: 98.8 F (37.1 C)  98 F (36.7 C) 98.3 F (36.8 C)  TempSrc:      SpO2: 98%  98% 94%  Weight:      Height:        General:  Pleasantly resting in bed, No acute distress.  Notable stutter and word finding  difficulty HEENT:  Normocephalic atraumatic.  Sclerae nonicteric, noninjected.  Extraocular movements intact bilaterally. Neck:  Without mass or deformity.  Trachea is midline. Lungs:  Clear to auscultate bilaterally without rhonchi, wheeze, or rales. Heart:  Regular rate and rhythm.  Without murmurs, rubs, or gallops. Abdomen:  Soft, nontender, nondistended.  Without guarding or rebound. Extremities: Without cyanosis, or edema   The results of significant diagnostics from this hospitalization (including imaging, microbiology, ancillary and laboratory) are listed below for reference.     Microbiology: No results found for this or any previous visit (from the past 240 hours).    Labs: BNP (last 3 results) No results for input(s): BNP in the last 8760 hours. Basic Metabolic Panel: Recent Labs  Lab 03/18/24 0940 03/20/24 0540 03/21/24 0833 03/23/24 0500  NA 145 145 146* 151*  K 3.7 3.6 3.6 3.0*  CL 113* 113* 113* 115*  CO2 22 22 21* 22  GLUCOSE 96 73 78 98  BUN 21 19 19 21   CREATININE 1.44* 1.59* 1.64* 1.56*  CALCIUM  9.4 9.0 9.3 9.2   Liver Function Tests: Recent Labs  Lab 03/18/24 0940 03/20/24 0540 03/21/24 0833 03/23/24 0500  AST 147* 80* 61* 79*  ALT 147* 94* 84* 69*  ALKPHOS 101 74 82 76  BILITOT 0.5 0.5 0.7 0.6  PROT 5.0* 4.9* 5.3* 5.2*  ALBUMIN  2.5* 2.3* 2.6* 2.5*   No results for input(s): LIPASE, AMYLASE in the last 168 hours. No results for input(s): AMMONIA in the last 168 hours.  CBC: Recent Labs  Lab 03/18/24 0940  WBC 3.7*  NEUTROABS 1.6*  HGB 8.1*  HCT 26.0*  MCV 93.2  PLT 208   Cardiac Enzymes: Recent Labs  Lab 03/18/24 0940 03/20/24 0540 03/23/24 0500  CKTOTAL 209 82 995*   BNP: Invalid input(s): POCBNP CBG: Recent Labs  Lab 03/22/24 2124 03/23/24 0715 03/23/24 1144 03/23/24 1709 03/23/24 2147  GLUCAP 93 79 85 112* 108*   D-Dimer No results for input(s): DDIMER in the last 72 hours. Hgb A1c No results for  input(s): HGBA1C in the last 72 hours. Lipid Profile No results for input(s): CHOL, HDL, LDLCALC, TRIG, CHOLHDL, LDLDIRECT in the last 72 hours. Thyroid  function studies No results for input(s): TSH, T4TOTAL, T3FREE, THYROIDAB in the last 72 hours.  Invalid input(s): FREET3 Anemia work up No results for input(s): VITAMINB12, FOLATE, FERRITIN, TIBC, IRON, RETICCTPCT in the last 72 hours. Urinalysis    Component Value Date/Time   COLORURINE RED (A) 03/10/2024 1426   APPEARANCEUR (A) 03/10/2024 1426    TEST NOT REPORTED DUE TO COLOR INTERFERENCE OF URINE PIGMENT   LABSPEC  03/10/2024 1426    TEST NOT REPORTED DUE TO COLOR INTERFERENCE OF URINE PIGMENT   PHURINE  03/10/2024 1426    TEST NOT REPORTED DUE TO COLOR INTERFERENCE OF URINE PIGMENT   GLUCOSEU (A) 03/10/2024 1426    TEST NOT REPORTED DUE TO COLOR INTERFERENCE OF URINE PIGMENT   HGBUR (A) 03/10/2024 1426    TEST NOT REPORTED DUE TO COLOR INTERFERENCE OF URINE PIGMENT   BILIRUBINUR (A) 03/10/2024 1426    TEST NOT REPORTED DUE TO COLOR INTERFERENCE OF URINE PIGMENT   BILIRUBINUR 1+ 07/12/2022 1120   KETONESUR (A) 03/10/2024 1426    TEST NOT REPORTED DUE TO COLOR INTERFERENCE OF URINE PIGMENT   PROTEINUR (A) 03/10/2024 1426    TEST NOT REPORTED DUE TO COLOR INTERFERENCE OF URINE PIGMENT   UROBILINOGEN negative (A) 07/12/2022 1120   NITRITE (A) 03/10/2024 1426    TEST NOT  REPORTED DUE TO COLOR INTERFERENCE OF URINE PIGMENT   LEUKOCYTESUR (A) 03/10/2024 1426    TEST NOT REPORTED DUE TO COLOR INTERFERENCE OF URINE PIGMENT   Sepsis Labs Recent Labs  Lab 03/18/24 0940  WBC 3.7*   Microbiology No results found for this or any previous visit (from the past 240 hours).    Time coordinating discharge: Over 30 minutes  SIGNED:   Elsie JAYSON Mont[place], DO [facility] 03/24/2024, 7:22 AM Pager   If 7PM-7AM, please contact night-coverage [email or web address]        [1]   Allergies Allergen Reactions   Amlodipine  Nausea Only, Swelling and Other (See Comments)    Severe edema and chest pain- also   Chlorhexidine  Gluconate Itching and Other (See Comments)    Only itching with wipes, not with soap   Ativan  [Lorazepam ] Other (See Comments)    Agitation/hallucination - not a true allergy

## 2024-03-25 ENCOUNTER — Inpatient Hospital Stay (HOSPITAL_COMMUNITY)

## 2024-03-25 DIAGNOSIS — K6812 Psoas muscle abscess: Secondary | ICD-10-CM | POA: Diagnosis not present

## 2024-03-25 LAB — COMPREHENSIVE METABOLIC PANEL WITH GFR
ALT: 82 U/L — ABNORMAL HIGH (ref 0–44)
AST: 163 U/L — ABNORMAL HIGH (ref 15–41)
Albumin: 2.5 g/dL — ABNORMAL LOW (ref 3.5–5.0)
Alkaline Phosphatase: 79 U/L (ref 38–126)
Anion gap: 12 (ref 5–15)
BUN: 22 mg/dL (ref 8–23)
CO2: 23 mmol/L (ref 22–32)
Calcium: 8.9 mg/dL (ref 8.9–10.3)
Chloride: 121 mmol/L — ABNORMAL HIGH (ref 98–111)
Creatinine, Ser: 1.92 mg/dL — ABNORMAL HIGH (ref 0.61–1.24)
GFR, Estimated: 35 mL/min — ABNORMAL LOW
Glucose, Bld: 96 mg/dL (ref 70–99)
Potassium: 3.3 mmol/L — ABNORMAL LOW (ref 3.5–5.1)
Sodium: 156 mmol/L — ABNORMAL HIGH (ref 135–145)
Total Bilirubin: 0.5 mg/dL (ref 0.0–1.2)
Total Protein: 5.2 g/dL — ABNORMAL LOW (ref 6.5–8.1)

## 2024-03-25 LAB — CBC
HCT: 28.4 % — ABNORMAL LOW (ref 39.0–52.0)
Hemoglobin: 8.5 g/dL — ABNORMAL LOW (ref 13.0–17.0)
MCH: 28.5 pg (ref 26.0–34.0)
MCHC: 29.9 g/dL — ABNORMAL LOW (ref 30.0–36.0)
MCV: 95.3 fL (ref 80.0–100.0)
Platelets: 247 10*3/uL (ref 150–400)
RBC: 2.98 MIL/uL — ABNORMAL LOW (ref 4.22–5.81)
RDW: 18.5 % — ABNORMAL HIGH (ref 11.5–15.5)
WBC: 5.3 10*3/uL (ref 4.0–10.5)
nRBC: 0 % (ref 0.0–0.2)

## 2024-03-25 LAB — PHOSPHORUS: Phosphorus: 3.9 mg/dL (ref 2.5–4.6)

## 2024-03-25 LAB — GLUCOSE, CAPILLARY
Glucose-Capillary: 94 mg/dL (ref 70–99)
Glucose-Capillary: 88 mg/dL (ref 70–99)
Glucose-Capillary: 117 mg/dL — ABNORMAL HIGH (ref 70–99)
Glucose-Capillary: 105 mg/dL — ABNORMAL HIGH (ref 70–99)

## 2024-03-25 LAB — MAGNESIUM: Magnesium: 1.8 mg/dL (ref 1.7–2.4)

## 2024-03-25 MED ORDER — DEXTROSE 5 % IV SOLN: INTRAVENOUS | Status: AC

## 2024-03-25 NOTE — Progress Notes (Signed)
 Occupational Therapy Treatment Patient Details Name: Charles Lloyd. MRN: [id number] DOB: June 24, 1940 Today's Date: 03/25/2024   History of present illness Charles Lloyd. is a 84 yr old male who presented to the emergency department on 02/22/2024 for leg pain and weakness, with family reporting multiple falls over the past 6 to 8 months.  MRI lumbar spine revealed a new 3.5 x 2.4 cm fluid collection in the left psoas muscle concerning for abscess.  Neurosurgery was consulted at that time and recommended IV antibiotics and possible aspiration of abscess.  PMH: hypertension, hyperlipidemia, type 2 diabetes, atrial fibrillation on anticoagulation, s/p recent L4-5 trans psoas lumbar interbody fusion left-sided approach on 02/06/2024, CHF,gout,CAD, TKA with infection, neuropathy   OT comments  The pt required increased assistance to transfer into sitting edge of bed. He initially required mod assist for sitting balance, however improved to brief instances of CGA to min assist with pt seated EOB. Pt noted to be with fatigue after a couple minutes seated EOB, with him needing to rest/prop his upper body back onto the bed for support. He then required max assist for sit to supine. While in the semi-fowler's position, he required moderate assistance to perform face washing and applying lip moisturizer using his LUE (RUE edema and fine motor coordination deficits noted). Overall, he was more alert today. He was also better able to initiate simple tasks when prompted, as well as follow 1 step commands ~50-60% of the time. Continue OT plan of care. Patient will benefit from continued inpatient follow up therapy, <3 hours/day.       If plan is discharge home, recommend the following:  Help with stairs or ramp for entrance;Assistance with cooking/housework;Assist for transportation;Direct supervision/assist for medications management;Direct supervision/assist for financial management;Two people to help with  walking and/or transfers;Assistance with feeding;A lot of help with bathing/dressing/bathroom   Equipment Recommendations  Other (comment) (defer to next setting)    Recommendations for Other Services      Precautions / Restrictions Precautions Precautions: Fall Recall of Precautions/Restrictions: Impaired Restrictions Weight Bearing Restrictions Per Provider Order: No       Mobility Bed Mobility Overal bed mobility: Needs Assistance Bed Mobility: Supine to Sit, Sit to Supine     Supine to sit: Total assist Sit to supine: Max assist   General bed mobility comments: Pt attempted to advance his BLE towards the edge of the bed when prompted, though required increased assistance to perform.         Balance     Sitting balance-Leahy Scale: Poor Sitting balance - Comments: Initial mod assist, however improved to brief instances of CGA to min assist with pt seated EOB         ADL either performed or assessed with clinical judgement   ADL Overall ADL's : Needs assistance/impaired     Grooming: Moderate assistance;Bed level;Cueing for sequencing Grooming Details (indicate cue type and reason): The pt used his LUE to bring a wash clothe to his face to perform face washing. He further applied lip moisturizer with moderate assistance after product was placed on his L index finger. He needed tactile and verbal cues for initial initiation and thoroughness with tasks. Grooming performed with the pt in the semi-fowler's position in bed.             Lower Body Dressing: Total assistance;Bed level Lower Body Dressing Details (indicate cue type and reason): to donn socks  Communication Communication Communication: Impaired Factors Affecting Communication: Difficulty expressing self;Reduced clarity of speech   Cognition Arousal: Alert Behavior During Therapy: Flat affect   Difficult to assess due to: Impaired communication       Attention  impairment (select first level of impairment): Divided attention Executive functioning impairment (select all impairments): Initiation, Problem solving, Sequencing          Following commands: Impaired Following commands impaired: Follows one step commands inconsistently      Cueing   Cueing Techniques: Verbal cues, Tactile cues, Gestural cues             Pertinent Vitals/ Pain       Pain Assessment Pain Assessment: Faces Pain Score: 4  Pain Location: generalized, back when boosting in bed. Pain Descriptors / Indicators: Grimacing, Discomfort Pain Intervention(s): Limited activity within patient's tolerance, Monitored during session, Repositioned   Frequency  Min 2X/week        Progress Toward Goals  OT Goals(current goals can now be found in the care plan section)     Acute Rehab OT Goals Patient Stated Goal: he did not specifically state OT Goal Formulation: With patient Time For Goal Achievement: 03/26/24 Potential to Achieve Goals: Fair  Plan         AM-PAC OT 6 Clicks Daily Activity     Outcome Measure   Help from another person eating meals?: Total Help from another person taking care of personal grooming?: A Lot Help from another person toileting, which includes using toliet, bedpan, or urinal?: Total Help from another person bathing (including washing, rinsing, drying)?: Total Help from another person to put on and taking off regular upper body clothing?: A Lot Help from another person to put on and taking off regular lower body clothing?: Total 6 Click Score: 8    End of Session Equipment Utilized During Treatment: Other (comment) (N/A)  OT Visit Diagnosis: Unsteadiness on feet (R26.81);Other abnormalities of gait and mobility (R26.89);Muscle weakness (generalized) (M62.81);Other symptoms and signs involving cognitive function   Activity Tolerance Other (comment) (Fair tolerance)   Patient Left in bed;with call bell/phone within reach;with bed  alarm set   Nurse Communication Mobility status        Time: 8568-8547 OT Time Calculation (min): 21 min  Charges: OT General Charges $OT Visit: 1 Visit OT Treatments $Therapeutic Activity: 8-22 mins    Delanna JINNY Lesches, OTR/L 03/25/2024, 4:49 PM

## 2024-03-25 NOTE — Progress Notes (Addendum)
 " PROGRESS NOTE    Charles Lloyd.  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 He was found to have a 3.5 x 2.4 cm L psoas muscle abscess. Neurosurgery recommended IR drainage and IV antibiotics. Underwent CT-guided aspiration on 02/24/24. Developed hospital-associated delirium requiring ICU transfer, now improved. Currently on antibiotics through 04/10/2024 as per ID recommendations. Psychiatry consulted on 03/06/2024 for intermittent agitation and as per family request. Palliative care also consulted for goals of care discussion.   Patient remains medically stable for discharge at this point - mental status (while waxing and waning) has continued to stabilize with poorly intelligible speech but able to follow commands. Will likely need prolonged therapy in regards to speech and physical/occupational - discharge to SNF today given insurance Auth and bed approved.  Assessment & Plan:   Principal Problem:   Psoas abscess, left (HCC) Active Problems:   Paroxysmal A-fib (HCC)   Chronic heart failure with preserved ejection fraction (HFpEF) (HCC)   NIDDM-2 with polyneuropathy and hyperglycemia   Hypercholesterolemia   Hyponatremia   Asthma   Essential hypertension   Recurrent falls   Altered mental status   Alcohol  withdrawal syndrome, with delirium (HCC)   Delirium   Pressure injury   Acute on chronic urinary retention   Stress reaction causing mixed disturbance of emotion and conduct   Peripheral neuropathy   Malnutrition of moderate degree   Left psoas abscess, POA: Sepsis POA with tachycardia,  fever and infection. - Patient noted to have recent transverse lumbar interbody fusion on 02/06/2024. - Patient seen in consultation by IR and patient underwent  CT-guided aspiration of left psoas fluid collection with drain placement. - Cultures remain negative. - Infectious disease following, transitioned back transition back to daptomycin  given resolution of CK, added ertapenem  through 04/10/2024. - Continue linezolid  and ertapenem  through 04/10/2024.   Acute metabolic encephalopathy: Delirium / alcohol  withdrawal syndrome with delirium/ possible underlying dementia - Initial agitation likely in the setting of infection. - Subsequent medications including benzodiazepine, Precedex , phenobarbital  with no real improvement in patient's mental status and ongoing agitation -given age polypharmacy is of concern. - Subsequent renal dysfunction and liver dysfunction in the setting of multiple medications and infection likely contributing as well - MRI 02/24/2024 negative, repeat CT head on 03/10/2024 negative for acute process. - Labs unremarkable including ammonia, B12, TSH, - EEG unrevealing -consistent with metabolic/toxic etiology. - Psychiatry consulted per family request 03/06/2024 -initiated on Rozerem  with subsequent somnolence and stupor with questionable tremors and poor p.o. intake -subsequently discontinued. - Long longstanding gabapentin  also discontinued in this timeframe. - Continue to follow patient clinically given ongoing improvement, although mild, over the past few days.  -Continue advance diet as tolerated.   Acute kidney injury : Hypovolemic hyponatremia, resolved. Metabolic acidosis, resolved. - Likely multifactorial in setting of poor p.o. 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 - Renal function improving daily with increased p.o. intake and IV fluids - ANA, ANCA -within normal limits, kappa lambda light chains noted to be mildly elevated but ratio within normal limits - Nephrology was following but have signed off.  - Bicarb tablets discontinued.    Elevated LFTs,  resolving -Likely multifactorial similar to a late AKI as above in the setting of poor p.o.  intake, hypovolemia, hypotension and medication injury - Hepatitis panel negative other than reactive hepatitis C antibody(false positive given subsequent confirmatory tests negative) - Imaging unremarkable other than other than hepatic steatosis and slightly echogenic liver, nonspecific. - Statin discontinued. - Restart daptomycin  given discussion with ID, vancomycin  discontinued - GI previously consulted with concern for drug-induced liver injury.   Paroxysmal atrial fibrillation - Continue Coreg  and flecainide  for rate control. - Continue Eliquis  for anticoagulation.   Chronic diastolic heart failure : Hypertension/Hyperlipidemia: - Continue hydralazine , Coreg . 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  Well controlled. - Continue to hold gabapentin  secondary to somnolence/drowsiness.   Asthma: -Stable, not in acute exacerbation. - Continue montelukast .   Recurrent falls: -PT/OT. - Tentative plan for discharge to SNF   Acute on chronic urinary retention: - Patient noted to have failed voiding trial on 03/04/2024 and Foley catheter had to be placed on 03/04/2024. - Continue Flomax . - Plan to discharge with Foley catheter in place with outpatient follow-up with urology.   Moderate malnutrition - advance diet as tolerated  Anemia of chronic disease - Hemoglobin stable.   Pressure injury, POA Wound 02/24/24 1245 Pressure Injury Buttocks Mid;Lower Deep Tissue Pressure Injury - Purple or maroon localized area of discolored intact skin or blood-filled blister due to damage of underlying soft tissue from pressure and/or shear. (Active)     Wound 02/24/24 1245 Pressure Injury Scrotum Circumferential Deep Tissue Pressure Injury - Purple or maroon  localized area of discolored intact skin or blood-filled blister due to damage of underlying soft tissue from pressure and/or shear. (Active)    Hypernatremia: Start D5W @ 40 cc/ hr , monitor serum sodium.   DVT prophylaxis: (Eliquis ) Code Status: DNR Family Communication: No family at bed side Disposition Plan:     Status is: Inpatient Remains inpatient appropriate because: Severity of illness.  Patient was discharged but no transport was available during the weekend.    Consultants:  None  Procedures: None  Antimicrobials:  Anti-infectives (From admission, onward)    Start     Dose/Rate Route Frequency Ordered Stop   03/23/24 1000  linezolid  (ZYVOX ) tablet 600 mg        600 mg Oral Every 12 hours 03/23/24 0757     03/23/24 0000  linezolid  (ZYVOX ) 600 MG tablet        600 mg Oral Every 12 hours 03/23/24 0823 04/10/24 2359   03/21/24 0000  daptomycin  (CUBICIN ) IVPB  Status:  Discontinued        600 mg Intravenous Every 24 hours 03/21/24 1215 03/23/24    03/21/24 0000  ertapenem  (INVANZ ) IVPB        1 g Intravenous Every 24 hours 03/21/24 1215 04/18/24 2359   03/19/24 1400  DAPTOmycin  (CUBICIN ) 600 mg in sodium chloride  0.9 % IVPB  Status:  Discontinued        8 mg/kg  79.3 kg 124 mL/hr over 30 Minutes Intravenous Daily 03/18/24 1400 03/23/24 0757   03/18/24 1600  DAPTOmycin  (CUBICIN ) IVPB 500 mg/14mL premix        500 mg 100 mL/hr over 30 Minutes Intravenous  Once 03/18/24 1400 03/18/24 1800   03/14/24 1500  vancomycin  (VANCOCIN ) IVPB 1000 mg/200 mL premix  Status:  Discontinued        1,000 mg 200 mL/hr over 60 Minutes Intravenous Every 24 hours 03/14/24 0935 03/18/24  1400   03/13/24 1400  DAPTOmycin  (CUBICIN ) 600 mg in sodium chloride  0.9 % IVPB  Status:  Discontinued        8 mg/kg  79.3 kg 124 mL/hr over 30 Minutes Intravenous Daily 03/13/24 1034 03/14/24 0908   03/13/24 1115  ertapenem  (INVANZ ) 1 g in sodium chloride  0.9 % 100 mL IVPB        1 g 200 mL/hr over  30 Minutes Intravenous Every 24 hours 03/13/24 1034     03/11/24 1000  ciprofloxacin  (CIPRO ) IVPB 400 mg  Status:  Discontinued        400 mg 200 mL/hr over 60 Minutes Intravenous Every 24 hours 03/10/24 1456 03/13/24 1034   03/10/24 1400  DAPTOmycin  (CUBICIN ) 600 mg in sodium chloride  0.9 % IVPB  Status:  Discontinued        8 mg/kg  79.3 kg 124 mL/hr over 30 Minutes Intravenous Every 48 hours 03/09/24 1013 03/13/24 1034   03/10/24 1000  ceFEPIme  (MAXIPIME ) 2 g in sodium chloride  0.9 % 100 mL IVPB  Status:  Discontinued        2 g 200 mL/hr over 30 Minutes Intravenous Every 24 hours 03/09/24 1013 03/10/24 1456   03/07/24 1800  ceFEPIme  (MAXIPIME ) 2 g in sodium chloride  0.9 % 100 mL IVPB  Status:  Discontinued        2 g 200 mL/hr over 30 Minutes Intravenous Every 12 hours 03/07/24 1118 03/09/24 1013   02/28/24 2200  ceFEPIme  (MAXIPIME ) 2 g in sodium chloride  0.9 % 100 mL IVPB  Status:  Discontinued        2 g 200 mL/hr over 30 Minutes Intravenous Every 8 hours 02/28/24 1440 03/07/24 1118   02/28/24 1530  DAPTOmycin  (CUBICIN ) 600 mg in sodium chloride  0.9 % IVPB  Status:  Discontinued        8 mg/kg  79.3 kg 124 mL/hr over 30 Minutes Intravenous Daily 02/28/24 1440 03/09/24 1013   02/28/24 0000  daptomycin  (CUBICIN ) IVPB  Status:  Discontinued        600 mg Intravenous Every 24 hours 02/28/24 1444 03/09/24    02/28/24 0000  ceFEPime  (MAXIPIME ) IVPB  Status:  Discontinued        2 g Intravenous Every 8 hours 02/28/24 1444 03/09/24    02/23/24 2200  vancomycin  (VANCOREADY) IVPB 1500 mg/300 mL  Status:  Discontinued        1,500 mg 150 mL/hr over 120 Minutes Intravenous Every 24 hours 02/22/24 2348 02/23/24 0242   02/23/24 1000  linezolid  (ZYVOX ) IVPB 600 mg  Status:  Discontinued       Note to Pharmacy: Pt developed rash shortly after vancomycin  administration.   600 mg 300 mL/hr over 60 Minutes Intravenous Every 12 hours 02/23/24 0242 02/28/24 1440   02/23/24 0800   piperacillin -tazobactam (ZOSYN ) IVPB 3.375 g  Status:  Discontinued        3.375 g 12.5 mL/hr over 240 Minutes Intravenous Every 8 hours 02/22/24 2348 02/28/24 1440   02/22/24 2315  piperacillin -tazobactam (ZOSYN ) IVPB 3.375 g        3.375 g 100 mL/hr over 30 Minutes Intravenous  Once 02/22/24 2306 02/23/24 0013   02/22/24 2315  vancomycin  (VANCOREADY) IVPB 1500 mg/300 mL        1,500 mg 150 mL/hr over 120 Minutes Intravenous  Once 02/22/24 2306 02/23/24 0141       Subjective: Patient was seen and examined at bedside. Overnight events noted. Patient seems arousable but not verbal, not  following commands. RN denies any overnight events. TOC states no bed available.  Objective: Vitals:   03/24/24 0648 03/24/24 1447 03/24/24 2102 03/25/24 0541  BP: 107/81 113/69 110/88 102/71  Pulse:  (!) 43 63   Resp: 18  18 20   Temp: 98.3 F (36.8 C) 98.2 F (36.8 C) 98.6 F (37 C) 99.8 F (37.7 C)  TempSrc:      SpO2: 94% 95% 93% 91%  Weight:      Height:        Intake/Output Summary (Last 24 hours) at 03/25/2024 1040 Last data filed at 03/25/2024 0552 Gross per 24 hour  Intake 20 ml  Output 625 ml  Net -605 ml   Filed Weights   02/22/24 1238 02/24/24 1245 03/19/24 0540  Weight: 79.4 kg 79.3 kg 79.3 kg    Examination:  General exam: Appears calm and comfortable, deconditioned, not in any acute distress. Respiratory system: CTA Bilaterally . Respiratory effort normal.  RR 16 Cardiovascular system: S1 & S2 heard, RRR. No JVD, murmurs, rubs, gallops or clicks.  Gastrointestinal system: Abdomen is non distended, soft and non tender.  Normal bowel sounds heard. Central nervous system: Arousable, Not following commands . Extremities: No edema, no cyanosis, no clubbing. Skin: No rashes, lesions or ulcers Psychiatry: Judgement and insight appear normal. Mood & affect appropriate.     Data Reviewed: I have personally reviewed following labs and imaging studies  CBC: Recent Labs   Lab 03/25/24 0640  WBC 5.3  HGB 8.5*  HCT 28.4*  MCV 95.3  PLT 247   Basic Metabolic Panel: Recent Labs  Lab 03/20/24 0540 03/21/24 0833 03/23/24 0500 03/25/24 0640  NA 145 146* 151* 156*  K 3.6 3.6 3.0* 3.3*  CL 113* 113* 115* 121*  CO2 22 21* 22 23  GLUCOSE 73 78 98 96  BUN 19 19 21 22   CREATININE 1.59* 1.64* 1.56* 1.92*  CALCIUM  9.0 9.3 9.2 8.9  MG  --   --   --  1.8  PHOS  --   --   --  3.9   GFR: Estimated Creatinine Clearance: 29.2 mL/min (A) (by C-G formula based on SCr of 1.92 mg/dL (H)). Liver Function Tests: Recent Labs  Lab 03/20/24 0540 03/21/24 0833 03/23/24 0500 03/25/24 0640  AST 80* 61* 79* 163*  ALT 94* 84* 69* 82*  ALKPHOS 74 82 76 79  BILITOT 0.5 0.7 0.6 0.5  PROT 4.9* 5.3* 5.2* 5.2*  ALBUMIN  2.3* 2.6* 2.5* 2.5*   No results for input(s): LIPASE, AMYLASE in the last 168 hours. No results for input(s): AMMONIA in the last 168 hours. Coagulation Profile: No results for input(s): INR, PROTIME in the last 168 hours. Cardiac Enzymes: Recent Labs  Lab 03/20/24 0540 03/23/24 0500  CKTOTAL 82 995*   BNP (last 3 results) No results for input(s): PROBNP in the last 8760 hours. HbA1C: No results for input(s): HGBA1C in the last 72 hours. CBG: Recent Labs  Lab 03/24/24 0732 03/24/24 1208 03/24/24 1617 03/24/24 2104 03/25/24 0743  GLUCAP 85 86 81 100* 94   Lipid Profile: No results for input(s): CHOL, HDL, LDLCALC, TRIG, CHOLHDL, LDLDIRECT in the last 72 hours. Thyroid  Function Tests: No results for input(s): TSH, T4TOTAL, FREET4, T3FREE, THYROIDAB in the last 72 hours. Anemia Panel: No results for input(s): VITAMINB12, FOLATE, FERRITIN, TIBC, IRON, RETICCTPCT in the last 72 hours. Sepsis Labs: No results for input(s): PROCALCITON, LATICACIDVEN in the last 168 hours.  No results found for this or any previous  visit (from the past 240 hours).   Radiology Studies: No results  found.  Scheduled Meds:  allopurinol   100 mg Oral Daily   apixaban   2.5 mg Oral BID   carvedilol   3.125 mg Oral BID   Chlorhexidine  Gluconate Cloth  6 each Topical Daily   cycloSPORINE   1 drop Both Eyes Daily   feeding supplement  237 mL Oral BID BM   flecainide   50 mg Oral BID   folic acid   1 mg Oral Daily   hydrALAZINE   25 mg Oral BID   hydrocortisone  cream   Topical TID   insulin  aspart  0-6 Units Subcutaneous TID AC & HS   linezolid   600 mg Oral Q12H   montelukast   10 mg Oral QHS   multivitamin with minerals  1 tablet Oral Daily   potassium chloride   20 mEq Oral Daily   senna-docusate  1 tablet Oral QHS   sodium chloride  flush  3 mL Intravenous Q12H   tamsulosin   0.4 mg Oral QPC supper   thiamine   100 mg Oral Daily   Continuous Infusions:  dextrose      ertapenem  1 g (03/24/24 1100)     LOS: 32 days    Time spent: 50 mins    Darcel Dawley, MD [facility]   If 7PM-7AM, please contact night-coverage  "

## 2024-03-25 NOTE — Plan of Care (Signed)
" °  Problem: Metabolic: Goal: Ability to maintain appropriate glucose levels will improve Outcome: Progressing   Problem: Skin Integrity: Goal: Risk for impaired skin integrity will decrease Outcome: Progressing   Problem: Clinical Measurements: Goal: Ability to maintain clinical measurements within normal limits will improve Outcome: Progressing Goal: Will remain free from infection Outcome: Progressing   Problem: Elimination: Goal: Will not experience complications related to bowel motility Outcome: Progressing Goal: Will not experience complications related to urinary retention Outcome: Progressing   Problem: Pain Managment: Goal: General experience of comfort will improve and/or be controlled Outcome: Progressing   Problem: Safety: Goal: Ability to remain free from injury will improve Outcome: Progressing   "

## 2024-03-25 NOTE — Plan of Care (Signed)
  Problem: Education: Goal: Ability to describe self-care measures that may prevent or decrease complications (Diabetes Survival Skills Education) will improve Outcome: Progressing Goal: Individualized Educational Video(s) Outcome: Progressing   Problem: Fluid Volume: Goal: Ability to maintain a balanced intake and output will improve Outcome: Progressing   Problem: Health Behavior/Discharge Planning: Goal: Ability to identify and utilize available resources and services will improve Outcome: Progressing Goal: Ability to manage health-related needs will improve Outcome: Progressing

## 2024-03-25 NOTE — Care Management Important Message (Signed)
 Important Message  Patient Details IM Letter given. Name: Charles Lloyd. MRN: [id number] Date of Birth: 07-Oct-1940   Important Message Given:  Yes - Medicare IM     Nieko Clarin 03/25/2024, 9:25 AM

## 2024-03-25 NOTE — Progress Notes (Signed)
 Speech Language Pathology Treatment: Dysphagia  Patient Details Name: Charles Lloyd. MRN: [id number] DOB: 1940/07/10 Today's Date: 03/25/2024 Time: 8669-8587 SLP Time Calculation (min) (ACUTE ONLY): 42 min  Assessment / Plan / Recommendation Clinical Impression  Pt demonstrates ongoing oral dysphagia with severe oral weakness and cognitive impairment impacting oral intake. Pt is at risk for both failure to thrive and aspiration and requires total assisted feeding by skilled caregiver. Will downgrade to puree and thin to improve efficiency.   Pt has decreased labial seal, significant anterior bolus loss and no ability to masticate. Pt required careful hand feeding, intermittent oral suction to remove pooled/pocketed material. Pt is following commands but is too weak to sip from a straw. SLP found that placing a towel under pts mouth with some pressure to chin and cheek with cup sips the most efficient method for drinking. Called son to discuss methods and suggested an adaptive cup, like a sippy cup to help him drink. We talked about using liquid supplements to improve caloric intake. Pt to get f/u with SLP at next level of care to set up strategies with staff. Discussed methods with staff here who have also been struggling to feed pt effectively.     HPI HPI: Charles Lloyd. is a 84 y.o. male who presented to the emergency department on 02/22/2024 for leg pain and weakness, with family reporting multiple falls over the past 6 to 8 months.  MRI lumbar spine revealed a new 3.5 x 2.4 cm fluid collection in the left psoas muscle concerning for abscess.  Neurosurgery was consulted at that time and recommended IV antibiotics and possible aspiration of abscess. BSE 03/10/24: NPO, Dys1/thin 03/11/24, Dys3/thin 03/13/24. Dys2/thin 03/18/24. PMH HTN, hyperlipidemia, DM2, atrial fibrillation on anticoagulation, s/p recent L4-5 trans psoas lumbar interbody fusion left-sided approach on 02/06/2024, CHF,gout,CAD,  TKA with infection, neuropathy      SLP Plan  Continue with current plan of care        Swallow Evaluation Recommendations   Recommendations: PO diet PO Diet Recommendation: Dysphagia 1 (Pureed);Thin liquids (Level 0) Liquid Administration via: Cup;Straw Medication Administration: Crushed with puree Supervision: Full assist for feeding Postural changes: Position pt fully upright for meals Oral care recommendations: Oral care before PO;Use suctioning for oral care (oral care after meals) Caregiver Recommendations: Have oral suction available     Recommendations                     Oral care before and after PO   Frequent or constant Supervision/Assistance Dysphagia, oropharyngeal phase (R13.12)     Continue with current plan of care     Arif Amendola, Consuelo Fitch  03/25/2024, 2:19 PM

## 2024-03-25 NOTE — TOC Progression Note (Addendum)
 Transition of Care (TOC) - Progression Note    Patient Details  Name: Charles Lloyd. MRN: [id number] Date of Birth: 16-Mar-1940  Transition of Care [facility]) CM/SW Contact  [name], RN Phone Number:[phone]  03/25/2024, 1:37 PM  Clinical Narrative:    CM reached out to [facility] with admissions at [facility]. Per [place] DON is reviewing patients chart and once determination has been made facility will respond to bed request via the HUB. CM has expanded bed search due to no bed offers.    estination  Service Provider Request Status Services Address Phone Fax Patient Preferred  [facility] SNF  Accepted -- [address], [place] [address] [phone] --  [facility]  Accepted -- [address] [place] [address] [phone] --  [facility]  Accepted -- [address], [place] [address] [phone] --  [facility]  Considering Need clinical review -- [address], [place] [address] [phone] --  [facility] Preferred SNF  Considering -- [address], [place] [address] [phone] --  [facility]erred SN[facility]  Pending - Request Sent -- [address] [place] [address] [phone] --  [facility] Preferred SNF  Pending - Request Sent -- [address] [place] [address] [phone] --  [facility]  Pending - Request Sent -- [address], [place] [address] [phone] --  [facility]erred [facility]  Pending - Request Sent -- [address] [place] [address] [phone] --  [facility]  Pending - Request Sent -- [address] [place] [address] [phone] --  [facility] AND  [facility] SN[facility]  Pending - Request Sent -- [address] [place] [address] [phone] --  [facility]erred SNF  Pending - Request Sent -- [address], [place] [address] [phone] --  [facility]  Pending - Request Sent -- [address] [place], [place] [address] [phone] --  [facility]  Pending - Request Sent -- [address] [place], [place] [address] [phone] --  [facility] Preferred SNF  Pending - Request Sent -- [address] [place] [address] [phone] --  [facility]  Pending - Request Sent -- [address], [place] [address] [phone] --  [facility] Preferred SNF  Pending - Request Sent -- [address], [place] [address] [phone] --  [facility] Preferred SNF  Pending - Request Sent -- [address] [place] [address] [phone] --  [facility]  Pending - No Request Sent -- [address], [place] [address] [phone] --  [facility] Preferred SNF  Declined Cannot meet patient's needs -- [address], [place] [address] [phone] --  [facility] REHABILIT[facility] Preferred SNF  Declined -- [address], [place] [address] [phone] --  [facility], [facility] Preferred SNF  Declined Cost of care -- [address], [place] [address] [phone] --  [facility] Preferred SNF  Declined No bed availablity -- [address] [place] [address] [phone] --  [facility] Preferred SNF  Declined No bed availablity --  [address] [place] [address] [phone] --  [facility]  Declined Behavior Issues -- [address], [place] [address] [phone] --  [address] SNF  Declined -- [address] [place], [place] [address] [phone] --  [facility] Preferred SNF  Declined -- [address] [place] [address] [phone] --  [facility]  Declined -- [address] [place] [address] [phone] --  [facility] Preferred SNF  Declined no bed -- [address], [place] [address] [phone] --  [facility]  Declined Facility declined (no reason given) -- [address] [place] [address] [phone] --  [facility] SN[facility]  Declined Cannot meet patient's needs -- [address], [place] [address] [phone] -- --  [facility] Preferred SNF  Declined Facility declined (no reason given) -- [address] [place] [address] [phone] --  [facility] PREFERRED SNF/ALF  Declined Facility full -- [address], [place] [address] [phone] --  [facility] SN[facility]/ALF  Declined Out of network -- [address], [place] [address] [phone] --  [facility] Preferred SNF  Declined -- [address], [place] [address] [phone] --  [facility]  Declined History of violence and/or drug or alcohol  abuse -- [address], [place] [address] [phone] --  [facility]  Declined insurance denied [facility] wont [facility] us , Nothing new noted to overturn Insurance denial -- [address] [place] [place] [address] [phone]       Expected Discharge Plan: Skilled Nursing Facility Barriers to Discharge: Continued Medical Work up               Expected Discharge Plan and Services In-house Referral: NA Discharge Planning Services: CM Consult Post Acute Care Choice: Skilled Nursing Facility Living arrangements for the past 2 months: Single Family Home Expected Discharge Date: 03/21/24                                     Social Drivers of Health (SDOH) Interventions SDOH Screenings   Food Insecurity: No Food Insecurity (02/23/2024)  Housing: Low Risk (02/23/2024)  Transportation Needs: No Transportation Needs (02/23/2024)  Utilities: Not At Risk (02/23/2024)  Alcohol  Screen: Low Risk (12/21/2022)  Depression (PHQ2-9): Low Risk (02/11/2024)  Financial Resource Strain: Low Risk (12/21/2022)  Physical Activity: Inactive (12/21/2022)  Social Connections: Socially Isolated (02/23/2024)  Stress: No Stress Concern Present (12/21/2022)  Tobacco Use: Low Risk (02/22/2024)  Health Literacy: Adequate Health Literacy (12/21/2022)    Readmission Risk Interventions    02/26/2024    2:28 PM 08/08/2021   10:00 AM  Readmission Risk Prevention Plan  Transportation Screening Complete Complete  PCP or Specialist Appt within 3-5 Days Complete   HRI or Home Care Consult Complete   Social Work Consult for Recovery Care Planning/Counseling Complete   Palliative Care Screening Not Applicable   Medication Review Oceanographer) Complete Complete  PCP or Specialist appointment within 3-5 days of discharge  Complete  HRI or Home Care Consult  Complete  SW Recovery Care/Counseling Consult  Not Complete  Palliative Care Screening  Not Applicable  Skilled Nursing Facility  Not Applicable

## 2024-03-26 ENCOUNTER — Inpatient Hospital Stay (HOSPITAL_COMMUNITY)

## 2024-03-26 DIAGNOSIS — K6812 Psoas muscle abscess: Secondary | ICD-10-CM | POA: Diagnosis not present

## 2024-03-26 LAB — CBC
HCT: 26.2 % — ABNORMAL LOW (ref 39.0–52.0)
Hemoglobin: 8.2 g/dL — ABNORMAL LOW (ref 13.0–17.0)
MCH: 29 pg (ref 26.0–34.0)
MCHC: 31.3 g/dL (ref 30.0–36.0)
MCV: 92.6 fL (ref 80.0–100.0)
Platelets: 242 10*3/uL (ref 150–400)
RBC: 2.83 MIL/uL — ABNORMAL LOW (ref 4.22–5.81)
RDW: 18.6 % — ABNORMAL HIGH (ref 11.5–15.5)
WBC: 6 10*3/uL (ref 4.0–10.5)
nRBC: 0 % (ref 0.0–0.2)

## 2024-03-26 LAB — BASIC METABOLIC PANEL WITH GFR
Anion gap: 11 (ref 5–15)
BUN: 24 mg/dL — ABNORMAL HIGH (ref 8–23)
CO2: 24 mmol/L (ref 22–32)
Calcium: 8.4 mg/dL — ABNORMAL LOW (ref 8.9–10.3)
Chloride: 120 mmol/L — ABNORMAL HIGH (ref 98–111)
Creatinine, Ser: 2.11 mg/dL — ABNORMAL HIGH (ref 0.61–1.24)
GFR, Estimated: 31 mL/min — ABNORMAL LOW
Glucose, Bld: 117 mg/dL — ABNORMAL HIGH (ref 70–99)
Potassium: 3.1 mmol/L — ABNORMAL LOW (ref 3.5–5.1)
Sodium: 154 mmol/L — ABNORMAL HIGH (ref 135–145)

## 2024-03-26 LAB — GLUCOSE, CAPILLARY
Glucose-Capillary: 104 mg/dL — ABNORMAL HIGH (ref 70–99)
Glucose-Capillary: 102 mg/dL — ABNORMAL HIGH (ref 70–99)
Glucose-Capillary: 122 mg/dL — ABNORMAL HIGH (ref 70–99)
Glucose-Capillary: 120 mg/dL — ABNORMAL HIGH (ref 70–99)

## 2024-03-26 LAB — MAGNESIUM: Magnesium: 1.7 mg/dL (ref 1.7–2.4)

## 2024-03-26 LAB — CK: Total CK: 776 U/L — ABNORMAL HIGH (ref 49–397)

## 2024-03-26 LAB — PHOSPHORUS: Phosphorus: 3.9 mg/dL (ref 2.5–4.6)

## 2024-03-26 MED ORDER — GERHARDT'S BUTT CREAM
1.0000 | TOPICAL_CREAM | Freq: Three times a day (TID) | CUTANEOUS | Status: DC
  Administered 2024-03-26 – 2024-03-31 (×14): 1 via TOPICAL
  Filled 2024-03-26 (×2): qty 60

## 2024-03-26 MED ORDER — POTASSIUM CHLORIDE 20 MEQ PO PACK
40.0000 meq | PACK | Freq: Once | ORAL | Status: AC
  Administered 2024-03-26: 40 meq via ORAL
  Filled 2024-03-26: qty 2

## 2024-03-26 NOTE — Plan of Care (Signed)
   Problem: Fluid Volume: Goal: Ability to maintain a balanced intake and output will improve Outcome: Progressing   Problem: Nutritional: Goal: Maintenance of adequate nutrition will improve Outcome: Progressing

## 2024-03-26 NOTE — TOC Progression Note (Addendum)
 Transition of Care (TOC) - Progression Note    Patient Details  Name: Charles Lloyd. MRN: [id number] Date of Birth: 18-Sep-1940  Transition of Care [facility]) CM/SW Contact  [name], RN Phone Number:[phone]  03/26/2024, 2:30 PM  Clinical Narrative:    Hospital liaison [name] updated CM on bed offer for patient. Patient has a bed offer for [facility] and rehab. CM has updated son Curtiss. CM to initiate insurance auth again due to previous shara has expired.   [address] Insurance auth ID # [id number] pending.    Expected Discharge Plan: Skilled Nursing Facility Barriers to Discharge: Continued Medical Work up               Expected Discharge Plan and Services In-house Referral: NA Discharge Planning Services: CM Consult Post Acute Care Choice: Skilled Nursing Facility Living arrangements for the past 2 months: Single Family Home Expected Discharge Date: 03/21/24                                     Social Drivers of Health (SDOH) Interventions SDOH Screenings   Food Insecurity: No Food Insecurity (02/23/2024)  Housing: Low Risk (02/23/2024)  Transportation Needs: No Transportation Needs (02/23/2024)  Utilities: Not At Risk (02/23/2024)  Alcohol  Screen: Low Risk (12/21/2022)  Depression (PHQ2-9): Low Risk (02/11/2024)  Financial Resource Strain: Low Risk (12/21/2022)  Physical Activity: Inactive (12/21/2022)  Social Connections: Socially Isolated (02/23/2024)  Stress: No Stress Concern Present (12/21/2022)  Tobacco Use: Low Risk (02/22/2024)  Health Literacy: Adequate Health Literacy (12/21/2022)    Readmission Risk Interventions    02/26/2024    2:28 PM 08/08/2021   10:00 AM  Readmission Risk Prevention Plan  Transportation Screening Complete Complete  PCP or Specialist Appt within 3-5 Days Complete   HRI or Home Care Consult Complete   Social Work Consult for Recovery Care Planning/Counseling Complete   Palliative Care Screening Not  Applicable   Medication Review Oceanographer) Complete Complete  PCP or Specialist appointment within 3-5 days of discharge  Complete  HRI or Home Care Consult  Complete  SW Recovery Care/Counseling Consult  Not Complete  Palliative Care Screening  Not Applicable  Skilled Nursing Facility  Not Applicable

## 2024-03-26 NOTE — Progress Notes (Signed)
 " PROGRESS NOTE    Charles Lloyd Charles Lloyd.  FMW:[id number] DOB: 1940/05/30 DOA: 02/22/2024 PCP: Theophilus Andrews, Tully GRADE, MD   Brief Narrative:  This 84 yrs old Male with h/o HTN, HLD, T2DM, afib on Eliquis , and L4-5 spondylolisthesis s/p fusion on 12/11 who presented with LLE weakness and pain with falls. He was found to have a 3.5 x 2.4 cm L psoas muscle abscess. Neurosurgery recommended IR drainage and IV antibiotics. Underwent CT-guided aspiration on 02/24/24. Developed hospital-associated delirium requiring ICU transfer, now improved. Currently on antibiotics through 04/10/2024 as per ID recommendations. Psychiatry consulted on 03/06/2024 for intermittent agitation and as per family request. Palliative care also consulted for goals of care discussion.   Patient remains medically stable for discharge at this point - mental status (while waxing and waning) has continued to stabilize with poorly intelligible speech but able to follow commands. Will likely need prolonged therapy in regards to speech and physical/occupational - discharge to SNF  given insurance Auth and bed approved.  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 - Patient noted to have recent transverse lumbar interbody fusion on 02/06/2024. - Patient seen in consultation by IR and patient underwent CT-guided  aspiration of left psoas fluid collection with drain placement. - Cultures remain negative. - Infectious disease following, transitioned back transition back to daptomycin  given resolution of CK, added ertapenem  through 04/10/2024. - Continue linezolid  and ertapenem  through 04/10/2024.   Acute metabolic encephalopathy: Delirium / alcohol  withdrawal syndrome with delirium / possible underlying dementia - Initial agitation likely in the setting of infection. - Subsequent medications including benzodiazepine, Precedex , phenobarbital  with no real improvement in patient's mental status and ongoing agitation -given age polypharmacy is of concern. - Subsequent renal dysfunction and liver dysfunction in the setting of multiple medications and infection likely contributing as well - MRI 02/24/2024 negative, repeat CT head on 03/10/2024 negative for acute process. - Labs unremarkable including ammonia, B12, TSH, - EEG unrevealing -consistent with metabolic/toxic etiology. - Psychiatry consulted per family request 03/06/2024 -initiated on Rozerem  with subsequent somnolence and stupor with questionable tremors and poor p.o. intake -subsequently discontinued. - Long longstanding gabapentin  also discontinued in this timeframe. - Continue to follow patient clinically given ongoing improvement, although mild, over the past few days.  -Continue advance diet as tolerated. - Patient seems improved, following commands.    Acute kidney injury : Hypovolemic hyponatremia, resolved. Metabolic acidosis, resolved. - Likely multifactorial in setting of poor p.o. 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 - Renal function improving daily with increased p.o. intake and IV fluids - ANA, ANCA -within normal limits, kappa lambda light chains noted to be mildly elevated but ratio within normal limits - Nephrology was following but have signed off.  - Bicarb tablets  discontinued.  Renal functions slightly up today.   Elevated LFTs, resolving -Likely multifactorial similar  to a late AKI as above in the setting of poor p.o. intake, hypovolemia, hypotension and medication injury - Hepatitis panel negative other than reactive hepatitis C antibody(false positive given subsequent confirmatory tests negative) - Imaging unremarkable other than other than hepatic steatosis and slightly echogenic liver, nonspecific. - Statin discontinued. - Restart daptomycin  given discussion with ID, vancomycin  discontinued. - GI previously consulted with concern for drug-induced liver injury. - Monitor LFTS.   Paroxysmal atrial fibrillation - Continue Coreg  and flecainide  for rate control. - Continue Eliquis  for anticoagulation.   Chronic diastolic heart failure : Hypertension/Hyperlipidemia: - Continue hydralazine , Coreg . - Resume diuretics at discharge as below. 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  Well controlled. - Continue to hold gabapentin  secondary to somnolence/drowsiness.   Asthma: -Stable, not in acute exacerbation. - Continue montelukast .   Recurrent falls: -PT/OT. - Tentative plan for discharge to SNF   Acute on chronic urinary retention: - Patient noted to have failed voiding trial on 03/04/2024 and Foley catheter had to be placed on 03/04/2024. - Continue Flomax . - Plan to discharge with Foley catheter in place with outpatient follow-up with urology.   Moderate malnutrition - advance diet as tolerated  Anemia of chronic disease - Hemoglobin stable.   Pressure injury, POA Wound 02/24/24 1245 Pressure Injury Buttocks Mid;Lower Deep Tissue Pressure Injury - Purple or maroon localized area of discolored intact skin or blood-filled blister due to damage of underlying soft tissue from pressure and/or shear. (Active)     Wound 02/24/24 1245  Pressure Injury Scrotum Circumferential Deep Tissue Pressure Injury - Purple or maroon localized area of discolored intact skin or blood-filled blister due to damage of underlying soft tissue from pressure and/or shear. (Active)    Hypernatremia: Continue D5W @ 70 cc/ hr , monitor serum sodium.   DVT prophylaxis: (Eliquis ) Code Status: DNR Family Communication: No family at bed side Disposition Plan:     Status is: Inpatient Remains inpatient appropriate because: Severity of illness.  Patient was discharged but no transport was available during the weekend. Patient has hypernatremia started on D5W.  Patient is not medically ready for discharge.      Consultants:  None  Procedures: None  Antimicrobials:  Anti-infectives (From admission, onward)    Start     Dose/Rate Route Frequency Ordered Stop   03/23/24 1000  linezolid  (ZYVOX ) tablet 600 mg        600 mg Oral Every 12 hours 03/23/24 0757     03/23/24 0000  linezolid  (ZYVOX ) 600 MG tablet        600 mg Oral Every 12 hours 03/23/24 0823 04/10/24 2359   03/21/24 0000  daptomycin  (CUBICIN ) IVPB  Status:  Discontinued        600 mg Intravenous Every 24 hours 03/21/24 1215 03/23/24    03/21/24 0000  ertapenem  (INVANZ ) IVPB        1 g Intravenous Every 24 hours 03/21/24 1215 04/18/24 2359   03/19/24 1400  DAPTOmycin  (CUBICIN ) 600 mg in sodium chloride  0.9 % IVPB  Status:  Discontinued        8 mg/kg  79.3 kg 124 mL/hr over 30 Minutes Intravenous Daily 03/18/24 1400 03/23/24 0757   03/18/24 1600  DAPTOmycin  (CUBICIN ) IVPB 500 mg/60mL premix        500 mg 100 mL/hr over 30 Minutes Intravenous  Once 03/18/24 1400 03/18/24 1800   03/14/24 1500  vancomycin  (  VANCOCIN ) IVPB 1000 mg/200 mL premix  Status:  Discontinued        1,000 mg 200 mL/hr over 60 Minutes Intravenous Every 24 hours 03/14/24 0935 03/18/24 1400   03/13/24 1400  DAPTOmycin  (CUBICIN ) 600 mg in sodium chloride  0.9 % IVPB  Status:  Discontinued        8 mg/kg   79.3 kg 124 mL/hr over 30 Minutes Intravenous Daily 03/13/24 1034 03/14/24 0908   03/13/24 1115  ertapenem  (INVANZ ) 1 g in sodium chloride  0.9 % 100 mL IVPB        1 g 200 mL/hr over 30 Minutes Intravenous Every 24 hours 03/13/24 1034     03/11/24 1000  ciprofloxacin  (CIPRO ) IVPB 400 mg  Status:  Discontinued        400 mg 200 mL/hr over 60 Minutes Intravenous Every 24 hours 03/10/24 1456 03/13/24 1034   03/10/24 1400  DAPTOmycin  (CUBICIN ) 600 mg in sodium chloride  0.9 % IVPB  Status:  Discontinued        8 mg/kg  79.3 kg 124 mL/hr over 30 Minutes Intravenous Every 48 hours 03/09/24 1013 03/13/24 1034   03/10/24 1000  ceFEPIme  (MAXIPIME ) 2 g in sodium chloride  0.9 % 100 mL IVPB  Status:  Discontinued        2 g 200 mL/hr over 30 Minutes Intravenous Every 24 hours 03/09/24 1013 03/10/24 1456   03/07/24 1800  ceFEPIme  (MAXIPIME ) 2 g in sodium chloride  0.9 % 100 mL IVPB  Status:  Discontinued        2 g 200 mL/hr over 30 Minutes Intravenous Every 12 hours 03/07/24 1118 03/09/24 1013   02/28/24 2200  ceFEPIme  (MAXIPIME ) 2 g in sodium chloride  0.9 % 100 mL IVPB  Status:  Discontinued        2 g 200 mL/hr over 30 Minutes Intravenous Every 8 hours 02/28/24 1440 03/07/24 1118   02/28/24 1530  DAPTOmycin  (CUBICIN ) 600 mg in sodium chloride  0.9 % IVPB  Status:  Discontinued        8 mg/kg  79.3 kg 124 mL/hr over 30 Minutes Intravenous Daily 02/28/24 1440 03/09/24 1013   02/28/24 0000  daptomycin  (CUBICIN ) IVPB  Status:  Discontinued        600 mg Intravenous Every 24 hours 02/28/24 1444 03/09/24    02/28/24 0000  ceFEPime  (MAXIPIME ) IVPB  Status:  Discontinued        2 g Intravenous Every 8 hours 02/28/24 1444 03/09/24    02/23/24 2200  vancomycin  (VANCOREADY) IVPB 1500 mg/300 mL  Status:  Discontinued        1,500 mg 150 mL/hr over 120 Minutes Intravenous Every 24 hours 02/22/24 2348 02/23/24 0242   02/23/24 1000  linezolid  (ZYVOX ) IVPB 600 mg  Status:  Discontinued       Note to Pharmacy: Pt  developed rash shortly after vancomycin  administration.   600 mg 300 mL/hr over 60 Minutes Intravenous Every 12 hours 02/23/24 0242 02/28/24 1440   02/23/24 0800  piperacillin -tazobactam (ZOSYN ) IVPB 3.375 g  Status:  Discontinued        3.375 g 12.5 mL/hr over 240 Minutes Intravenous Every 8 hours 02/22/24 2348 02/28/24 1440   02/22/24 2315  piperacillin -tazobactam (ZOSYN ) IVPB 3.375 g        3.375 g 100 mL/hr over 30 Minutes Intravenous  Once 02/22/24 2306 02/23/24 0013   02/22/24 2315  vancomycin  (VANCOREADY) IVPB 1500 mg/300 mL        1,500 mg 150 mL/hr over 120 Minutes  Intravenous  Once 02/22/24 2306 02/23/24 0141       Subjective: Patient was seen and examined at bedside.  Patient seems much improved, following commands. Son is at bedside.Denies  overnight events. TOC states no bed available.  Objective: Vitals:   03/25/24 2010 03/25/24 2130 03/25/24 2131 03/26/24 0537  BP: 92/61 92/61 92/61  (!) 106/59  Pulse: 63 63  80  Resp: 18   20  Temp: 98.7 F (37.1 C)   98.6 F (37 C)  TempSrc: Oral   Oral  SpO2: 95%   94%  Weight:      Height:        Intake/Output Summary (Last 24 hours) at 03/26/2024 1022 Last data filed at 03/26/2024 0320 Gross per 24 hour  Intake 1082.93 ml  Output 325 ml  Net 757.93 ml   Filed Weights   02/22/24 1238 02/24/24 1245 03/19/24 0540  Weight: 79.4 kg 79.3 kg 79.3 kg    Examination:  General exam: Appears calm and comfortable, deconditioned, not in any acute distress. Respiratory system: Decreased breath sounds. [name] Respiratory effort normal.  RR 15 Cardiovascular system: S1 & S2 heard, RRR. No JVD, murmurs, rubs, gallops or clicks.  Gastrointestinal system: Abdomen is non distended, soft and non tender.  Normal bowel sounds heard. Central nervous system: Arousable, Not following commands . Extremities: No edema, no cyanosis, no clubbing. Skin: No rashes, lesions or ulcers Psychiatry: Judgement and insight appear normal. Mood & affect  appropriate.     Data Reviewed: I have personally reviewed following labs and imaging studies  CBC: Recent Labs  Lab 03/25/24 0640 03/26/24 0500  WBC 5.3 6.0  HGB 8.5* 8.2*  HCT 28.4* 26.2*  MCV 95.3 92.6  PLT 247 242   Basic Metabolic Panel: Recent Labs  Lab 03/20/24 0540 03/21/24 0833 03/23/24 0500 03/25/24 0640 03/26/24 0500  NA 145 146* 151* 156* 154*  K 3.6 3.6 3.0* 3.3* 3.1*  CL 113* 113* 115* 121* 120*  CO2 22 21* 22 23 24   GLUCOSE 73 78 98 96 117*  BUN 19 19 21 22  24*  CREATININE 1.59* 1.64* 1.56* 1.92* 2.11*  CALCIUM  9.0 9.3 9.2 8.9 8.4*  MG  --   --   --  1.8 1.7  PHOS  --   --   --  3.9 3.9   GFR: Estimated Creatinine Clearance: 26.6 mL/min (A) (by C-G formula based on SCr of 2.11 mg/dL (H)). Liver Function Tests: Recent Labs  Lab 03/20/24 0540 03/21/24 0833 03/23/24 0500 03/25/24 0640  AST 80* 61* 79* 163*  ALT 94* 84* 69* 82*  ALKPHOS 74 82 76 79  BILITOT 0.5 0.7 0.6 0.5  PROT 4.9* 5.3* 5.2* 5.2*  ALBUMIN  2.3* 2.6* 2.5* 2.5*   No results for input(s): LIPASE, AMYLASE in the last 168 hours. No results for input(s): AMMONIA in the last 168 hours. Coagulation Profile: No results for input(s): INR, PROTIME in the last 168 hours. Cardiac Enzymes: Recent Labs  Lab 03/20/24 0540 03/23/24 0500 03/26/24 0500  CKTOTAL 82 995* 776*   BNP (last 3 results) No results for input(s): PROBNP in the last 8760 hours. HbA1C: No results for input(s): HGBA1C in the last 72 hours. CBG: Recent Labs  Lab 03/25/24 0743 03/25/24 1116 03/25/24 1634 03/25/24 2108 03/26/24 0730  GLUCAP 94 88 117* 105* 104*   Lipid Profile: No results for input(s): CHOL, HDL, LDLCALC, TRIG, CHOLHDL, LDLDIRECT in the last 72 hours. Thyroid  Function Tests: No results for input(s): TSH, T4TOTAL, FREET4, T3FREE,  THYROIDAB in the last 72 hours. Anemia Panel: No results for input(s): VITAMINB12, FOLATE, FERRITIN, TIBC, IRON,  RETICCTPCT in the last 72 hours. Sepsis Labs: No results for input(s): PROCALCITON, LATICACIDVEN in the last 168 hours.  No results found for this or any previous visit (from the past 240 hours).   Radiology Studies: DG CHEST PORT 1 VIEW Result Date: 03/25/2024 EXAM: 1 VIEW(S) XRAY OF THE CHEST 03/25/2024 05:34:00 PM COMPARISON: 02/22/2024 CLINICAL HISTORY: Cough. ICD10: 10031 Cough. FINDINGS: LINES, TUBES AND DEVICES: Right PICC with tip overlying the superior cavoatrial junction. LUNGS AND PLEURA: Left pleural effusion. Hazy left base opacity or atelectasis. No pneumothorax. HEART AND MEDIASTINUM: Cardiomegaly. Calcified aorta. BONES AND SOFT TISSUES: No acute osseous abnormality. IMPRESSION: 1. Left pleural effusion with hazy left basilar opacity or atelectasis. Recommend PA and lateral views of the chest for further evaluation. 2. Right PICC tip overlies the superior cavoatrial junction. Electronically signed by: Morgane Naveau MD 03/25/2024 08:27 PM EST RP Workstation: [id number]    Scheduled Meds:  allopurinol   100 mg Oral Daily   apixaban   2.5 mg Oral BID   carvedilol   3.125 mg Oral BID   Chlorhexidine  Gluconate Cloth  6 each Topical Daily   cycloSPORINE   1 drop Both Eyes Daily   feeding supplement  237 mL Oral BID BM   flecainide   50 mg Oral BID   folic acid   1 mg Oral Daily   hydrALAZINE   25 mg Oral BID   hydrocortisone  cream   Topical TID   insulin  aspart  0-6 Units Subcutaneous TID AC & HS   linezolid   600 mg Oral Q12H   montelukast   10 mg Oral QHS   multivitamin with minerals  1 tablet Oral Daily   potassium chloride   20 mEq Oral Daily   potassium chloride   40 mEq Oral Once   senna-docusate  1 tablet Oral QHS   sodium chloride  flush  3 mL Intravenous Q12H   tamsulosin   0.4 mg Oral QPC supper   thiamine   100 mg Oral Daily   Continuous Infusions:  dextrose  40 mL/hr at 03/26/24 0319   ertapenem  Stopped (03/25/24 1134)     LOS: 33 days    Time spent: 50  mins    Darcel Dawley, MD [facility]   If 7PM-7AM, please contact night-coverage  "

## 2024-03-26 NOTE — Progress Notes (Signed)
 Speech Language Pathology Treatment: Dysphagia;Cognitive-Linguistic  Patient Details Name: Charles Lloyd. MRN: [id number] DOB: 1940-04-21 Today's Date: 03/26/2024 Time: 1440-1510 SLP Time Calculation (min) (ACUTE ONLY): 30 min  Assessment / Plan / Recommendation Clinical Impression  Pt tolerating total assisted feeding with staff today. RN able to feed pt 50% of lunch meal with careful hand feeding. SLP reinforced effective strategies. SLP also able to target pts speech intelligibility today by focusing on pt awareness. Pt increased volume from whispering to yelling his name and when counting. Pt improved lingual contact to teeth, alveolar ridge and palate on initial sounds with visual and auditory cues x3. Pt read a sign posted in visual field for reminders to speak loudly. Suggestions for family also posted (counting, stating his name, reading words). Pt making progress today.     HPI HPI: Charles Lloyd. is a 84 y.o. male who presented to the emergency department on 02/22/2024 for leg pain and weakness, with family reporting multiple falls over the past 6 to 8 months.  MRI lumbar spine revealed a new 3.5 x 2.4 cm fluid collection in the left psoas muscle concerning for abscess.  Neurosurgery was consulted at that time and recommended IV antibiotics and possible aspiration of abscess. BSE 03/10/24: NPO, Dys1/thin 03/11/24, Dys3/thin 03/13/24. Dys2/thin 03/18/24. PMH HTN, hyperlipidemia, DM2, atrial fibrillation on anticoagulation, s/p recent L4-5 trans psoas lumbar interbody fusion left-sided approach on 02/06/2024, CHF,gout,CAD, TKA with infection, neuropathy      SLP Plan  Continue with current plan of care        Swallow Evaluation Recommendations   Recommendations: PO diet PO Diet Recommendation: Dysphagia 1 (Pureed);Thin liquids (Level 0) Liquid Administration via: Cup Medication Administration: Crushed with puree Supervision: Full assist for feeding Postural changes: Position  pt fully upright for meals Caregiver Recommendations: Have oral suction available     Recommendations                     Oral care before and after PO   Frequent or constant Supervision/Assistance Dysphagia, oropharyngeal phase (R13.12)     Continue with current plan of care     Charles Lloyd, Charles Lloyd  03/26/2024, 3:19 PM

## 2024-03-27 DIAGNOSIS — K6812 Psoas muscle abscess: Secondary | ICD-10-CM | POA: Diagnosis not present

## 2024-03-27 LAB — COMPREHENSIVE METABOLIC PANEL WITH GFR
ALT: 65 U/L — ABNORMAL HIGH (ref 0–44)
AST: 84 U/L — ABNORMAL HIGH (ref 15–41)
Albumin: 2.3 g/dL — ABNORMAL LOW (ref 3.5–5.0)
Alkaline Phosphatase: 80 U/L (ref 38–126)
Anion gap: 8 (ref 5–15)
BUN: 25 mg/dL — ABNORMAL HIGH (ref 8–23)
CO2: 24 mmol/L (ref 22–32)
Calcium: 8.6 mg/dL — ABNORMAL LOW (ref 8.9–10.3)
Chloride: 118 mmol/L — ABNORMAL HIGH (ref 98–111)
Creatinine, Ser: 2.13 mg/dL — ABNORMAL HIGH (ref 0.61–1.24)
GFR, Estimated: 31 mL/min — ABNORMAL LOW
Glucose, Bld: 144 mg/dL — ABNORMAL HIGH (ref 70–99)
Potassium: 3.6 mmol/L (ref 3.5–5.1)
Sodium: 151 mmol/L — ABNORMAL HIGH (ref 135–145)
Total Bilirubin: 0.5 mg/dL (ref 0.0–1.2)
Total Protein: 4.9 g/dL — ABNORMAL LOW (ref 6.5–8.1)

## 2024-03-27 LAB — GLUCOSE, CAPILLARY
Glucose-Capillary: 101 mg/dL — ABNORMAL HIGH (ref 70–99)
Glucose-Capillary: 110 mg/dL — ABNORMAL HIGH (ref 70–99)
Glucose-Capillary: 97 mg/dL (ref 70–99)
Glucose-Capillary: 102 mg/dL — ABNORMAL HIGH (ref 70–99)

## 2024-03-27 LAB — CBC
HCT: 24.5 % — ABNORMAL LOW (ref 39.0–52.0)
Hemoglobin: 7.7 g/dL — ABNORMAL LOW (ref 13.0–17.0)
MCH: 29.2 pg (ref 26.0–34.0)
MCHC: 31.4 g/dL (ref 30.0–36.0)
MCV: 92.8 fL (ref 80.0–100.0)
Platelets: 210 10*3/uL (ref 150–400)
RBC: 2.64 MIL/uL — ABNORMAL LOW (ref 4.22–5.81)
RDW: 18.7 % — ABNORMAL HIGH (ref 11.5–15.5)
WBC: 4.3 10*3/uL (ref 4.0–10.5)
nRBC: 0 % (ref 0.0–0.2)

## 2024-03-27 LAB — PHOSPHORUS: Phosphorus: 3.3 mg/dL (ref 2.5–4.6)

## 2024-03-27 LAB — MAGNESIUM: Magnesium: 1.8 mg/dL (ref 1.7–2.4)

## 2024-03-27 MED ORDER — LACTATED RINGERS IV BOLUS
500.0000 mL | Freq: Once | INTRAVENOUS | Status: AC
  Administered 2024-03-27: 500 mL via INTRAVENOUS

## 2024-03-27 MED ORDER — METOPROLOL TARTRATE 5 MG/5ML IV SOLN
2.5000 mg | Freq: Once | INTRAVENOUS | Status: AC
  Administered 2024-03-27: 2.5 mg via INTRAVENOUS
  Filled 2024-03-27: qty 5

## 2024-03-27 NOTE — Plan of Care (Signed)
   Problem: Clinical Measurements: Goal: Will remain free from infection Outcome: Progressing Goal: Diagnostic test results will improve Outcome: Progressing Goal: Respiratory complications will improve Outcome: Progressing Goal: Cardiovascular complication will be avoided Outcome: Progressing

## 2024-03-27 NOTE — Plan of Care (Signed)
  Problem: Coping: Goal: Ability to adjust to condition or change in health will improve Outcome: Progressing   Problem: Fluid Volume: Goal: Ability to maintain a balanced intake and output will improve Outcome: Progressing   Problem: Education: Goal: Ability to describe self-care measures that may prevent or decrease complications (Diabetes Survival Skills Education) will improve Outcome: Not Progressing Goal: Individualized Educational Video(s) Outcome: Not Progressing   Problem: Health Behavior/Discharge Planning: Goal: Ability to identify and utilize available resources and services will improve Outcome: Not Progressing

## 2024-03-27 NOTE — Plan of Care (Signed)
" °  Problem: Education: Goal: Ability to describe self-care measures that may prevent or decrease complications (Diabetes Survival Skills Education) will improve 03/27/2024 1542 by Alaina Dozier PARAS, RN Outcome: Adequate for Discharge 03/27/2024 [phone] by Alaina Dozier PARAS, RN Outcome: Not Progressing Goal: Individualized Educational Video(s) 03/27/2024 1542 by Alaina Dozier PARAS, RN Outcome: Adequate for Discharge 03/27/2024 0826 by Alaina Dozier PARAS, RN Outcome: Not Progressing   Problem: Coping: Goal: Ability to adjust to condition or change in health will improve 03/27/2024 1542 by Alaina Dozier PARAS, RN Outcome: Adequate for Discharge 03/27/2024 0826 by Alaina Dozier PARAS, RN Outcome: Progressing   Problem: Fluid Volume: Goal: Ability to maintain a balanced intake and output will improve 03/27/2024 1542 by Alaina Dozier PARAS, RN Outcome: Adequate for Discharge 03/27/2024 0826 by Alaina Dozier PARAS, RN Outcome: Progressing   Problem: Health Behavior/Discharge Planning: Goal: Ability to identify and utilize available resources and services will improve 03/27/2024 1542 by Alaina Dozier PARAS, RN Outcome: Adequate for Discharge 03/27/2024 [id number] by Alaina Dozier PARAS, RN Outcome: Not Progressing Goal: Ability to manage health-related needs will improve Outcome: Adequate for Discharge   Problem: Metabolic: Goal: Ability to maintain appropriate glucose levels will improve Outcome: Adequate for Discharge   Problem: Nutritional: Goal: Maintenance of adequate nutrition will improve Outcome: Adequate for Discharge Goal: Progress toward achieving an optimal weight will improve Outcome: Adequate for Discharge   Problem: Skin Integrity: Goal: Risk for impaired skin integrity will decrease Outcome: Adequate for Discharge   Problem: Tissue Perfusion: Goal: Adequacy of tissue perfusion will improve Outcome: Adequate for Discharge   Problem: Education: Goal:  Knowledge of General Education information will improve Description: Including pain rating scale, medication(s)/side effects and non-pharmacologic comfort measures Outcome: Adequate for Discharge   Problem: Health Behavior/Discharge Planning: Goal: Ability to manage health-related needs will improve Outcome: Adequate for Discharge   Problem: Clinical Measurements: Goal: Ability to maintain clinical measurements within normal limits will improve Outcome: Adequate for Discharge Goal: Will remain free from infection Outcome: Adequate for Discharge Goal: Diagnostic test results will improve Outcome: Adequate for Discharge Goal: Respiratory complications will improve Outcome: Adequate for Discharge Goal: Cardiovascular complication will be avoided Outcome: Adequate for Discharge   Problem: Activity: Goal: Risk for activity intolerance will decrease Outcome: Adequate for Discharge   Problem: Nutrition: Goal: Adequate nutrition will be maintained Outcome: Adequate for Discharge   Problem: Elimination: Goal: Will not experience complications related to bowel motility Outcome: Adequate for Discharge Goal: Will not experience complications related to urinary retention Outcome: Adequate for Discharge   Problem: Pain Managment: Goal: General experience of comfort will improve and/or be controlled Outcome: Adequate for Discharge   Problem: Safety: Goal: Ability to remain free from injury will improve Outcome: Adequate for Discharge   Problem: Skin Integrity: Goal: Risk for impaired skin integrity will decrease Outcome: Adequate for Discharge   "

## 2024-03-27 NOTE — Progress Notes (Signed)
 " PROGRESS NOTE    Charles Lloyd.  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 He was found to have a 3.5 x 2.4 cm L psoas muscle abscess. Neurosurgery recommended IR drainage and IV antibiotics. Underwent CT-guided aspiration on 02/24/24. Developed hospital-associated delirium requiring ICU transfer, now improved. Currently on antibiotics through 04/10/2024 as per ID recommendations. Psychiatry consulted on 03/06/2024 for intermittent agitation and as per family request. Palliative care also consulted for goals of care discussion.   Patient remains medically stable for discharge at this point - mental status (while waxing and waning) has continued to stabilize with poorly intelligible speech but able to follow commands. Will likely need prolonged therapy in regards to speech and physical/occupational - discharge to SNF  given insurance Auth and bed approved.  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 - Patient noted to have recent transverse lumbar interbody fusion on 02/06/2024. - Patient seen in consultation by IR and patient underwent CT-guided  aspiration of left psoas fluid collection with drain placement. - Cultures remain negative. - Infectious disease following, transitioned back  to daptomycin  given resolution of CK, added ertapenem  through 04/10/2024. - Continue linezolid  and ertapenem  through 04/10/2024.   Acute metabolic encephalopathy: Delirium / alcohol  withdrawal syndrome with delirium / possible underlying dementia - Initial agitation likely in the setting of infection. - Subsequent medications including benzodiazepine, Precedex , phenobarbital  with no real improvement in patient's mental status and ongoing agitation -given age polypharmacy is of concern. - Subsequent renal dysfunction and liver dysfunction in the setting of multiple medications and infection likely contributing as well. - MRI 02/24/2024 negative, repeat CT head on 03/10/2024 negative for acute process. - Labs unremarkable including ammonia, B12, TSH, - EEG unrevealing -consistent with metabolic/toxic etiology. - Psychiatry consulted per family request 03/06/2024 -initiated on Rozerem  with subsequent somnolence and stupor with questionable tremors and poor p.o. intake -subsequently discontinued. - Long longstanding gabapentin  also discontinued in this timeframe. - Continue to follow patient clinically given ongoing improvement, although mild, over the past few days.  -Continue advance diet as tolerated. - Patient seems improved, following commands.    Acute kidney injury : Hypovolemic hyponatremia, resolved. Metabolic acidosis, resolved. - Likely multifactorial in setting of poor p.o. 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 - Renal function improving daily with increased p.o. intake and IV fluids - ANA, ANCA -within normal limits, kappa lambda light chains noted to be mildly elevated but ratio within normal limits - Nephrology was following but have signed off.  - Bicarb tablets discontinued.  Renal  functions slightly up today.   Elevated LFTs, resolving -Likely multifactorial similar to  a late AKI as above in the setting of poor p.o. intake, hypovolemia, hypotension and medication injury - Hepatitis panel negative other than reactive hepatitis C antibody(false positive given subsequent confirmatory tests negative) - Imaging unremarkable other than other than hepatic steatosis and slightly echogenic liver, nonspecific. - Statin discontinued. - Restart daptomycin  given discussion with ID, vancomycin  discontinued. - GI previously consulted with concern for drug-induced liver injury. - Monitor LFTS.   Paroxysmal atrial fibrillation - Continue Coreg  and flecainide  for rate control. - Continue Eliquis  for anticoagulation.   Chronic diastolic heart failure : Hypertension/Hyperlipidemia: - Continue hydralazine , Coreg . - Resume diuretics at discharge as below. 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  Well controlled. - Continue to hold gabapentin  secondary to somnolence/drowsiness.   Asthma: -Stable, not in acute exacerbation. - Continue montelukast .   Recurrent falls: -PT/OT. - Tentative plan for discharge to SNF   Acute on chronic urinary retention: - Patient noted to have failed voiding trial on 03/04/2024 and Foley catheter had to be placed on 03/04/2024. - Continue Flomax . - Plan to discharge with Foley catheter in place with outpatient follow-up with urology.   Moderate malnutrition - advance diet as tolerated  Anemia of chronic disease - Hemoglobin stable.   Pressure injury, POA Wound 02/24/24 1245 Pressure Injury Buttocks Mid;Lower Deep Tissue Pressure Injury - Purple or maroon localized area of discolored intact skin or blood-filled blister due to damage of underlying soft tissue from pressure and/or shear. (Active)     Wound 02/24/24 1245 Pressure Injury Scrotum  Circumferential Deep Tissue Pressure Injury - Purple or maroon localized area of discolored intact skin or blood-filled blister due to damage of underlying soft tissue from pressure and/or shear. (Active)    Hypernatremia: Continue D5W @ 70 cc/ hr , sodium slowly improving. 156> 154> 151   DVT prophylaxis: (Eliquis ) Code Status: DNR Family Communication: No family at bed side Disposition Plan:     Status is: Inpatient Remains inpatient appropriate because: Severity of illness.  Patient was discharged but no transport was available during the weekend. Patient has hypernatremia,  started on D5W.  Patient is not medically ready for discharge.      Consultants:  None  Procedures: None  Antimicrobials:  Anti-infectives (From admission, onward)    Start     Dose/Rate Route Frequency Ordered Stop   03/23/24 1000  linezolid  (ZYVOX ) tablet 600 mg        600 mg Oral Every 12 hours 03/23/24 0757     03/23/24 0000  linezolid  (ZYVOX ) 600 MG tablet        600 mg Oral Every 12 hours 03/23/24 0823 04/10/24 2359   03/21/24 0000  daptomycin  (CUBICIN ) IVPB  Status:  Discontinued        600 mg Intravenous Every 24 hours 03/21/24 1215 03/23/24    03/21/24 0000  ertapenem  (INVANZ ) IVPB        1 g Intravenous Every 24 hours 03/21/24 1215 04/18/24 2359   03/19/24 1400  DAPTOmycin  (CUBICIN ) 600 mg in sodium chloride  0.9 % IVPB  Status:  Discontinued        8 mg/kg  79.3 kg 124 mL/hr over 30 Minutes Intravenous Daily 03/18/24 1400 03/23/24 0757   03/18/24 1600  DAPTOmycin  (CUBICIN ) IVPB 500 mg/59mL premix        500 mg 100 mL/hr over 30 Minutes Intravenous  Once 03/18/24 1400 03/18/24 1800   03/14/24  1500  vancomycin  (VANCOCIN ) IVPB 1000 mg/200 mL premix  Status:  Discontinued        1,000 mg 200 mL/hr over 60 Minutes Intravenous Every 24 hours 03/14/24 0935 03/18/24 1400   03/13/24 1400  DAPTOmycin  (CUBICIN ) 600 mg in sodium chloride  0.9 % IVPB  Status:  Discontinued        8 mg/kg  79.3  kg 124 mL/hr over 30 Minutes Intravenous Daily 03/13/24 1034 03/14/24 0908   03/13/24 1115  ertapenem  (INVANZ ) 1 g in sodium chloride  0.9 % 100 mL IVPB        1 g 200 mL/hr over 30 Minutes Intravenous Every 24 hours 03/13/24 1034     03/11/24 1000  ciprofloxacin  (CIPRO ) IVPB 400 mg  Status:  Discontinued        400 mg 200 mL/hr over 60 Minutes Intravenous Every 24 hours 03/10/24 1456 03/13/24 1034   03/10/24 1400  DAPTOmycin  (CUBICIN ) 600 mg in sodium chloride  0.9 % IVPB  Status:  Discontinued        8 mg/kg  79.3 kg 124 mL/hr over 30 Minutes Intravenous Every 48 hours 03/09/24 1013 03/13/24 1034   03/10/24 1000  ceFEPIme  (MAXIPIME ) 2 g in sodium chloride  0.9 % 100 mL IVPB  Status:  Discontinued        2 g 200 mL/hr over 30 Minutes Intravenous Every 24 hours 03/09/24 1013 03/10/24 1456   03/07/24 1800  ceFEPIme  (MAXIPIME ) 2 g in sodium chloride  0.9 % 100 mL IVPB  Status:  Discontinued        2 g 200 mL/hr over 30 Minutes Intravenous Every 12 hours 03/07/24 1118 03/09/24 1013   02/28/24 2200  ceFEPIme  (MAXIPIME ) 2 g in sodium chloride  0.9 % 100 mL IVPB  Status:  Discontinued        2 g 200 mL/hr over 30 Minutes Intravenous Every 8 hours 02/28/24 1440 03/07/24 1118   02/28/24 1530  DAPTOmycin  (CUBICIN ) 600 mg in sodium chloride  0.9 % IVPB  Status:  Discontinued        8 mg/kg  79.3 kg 124 mL/hr over 30 Minutes Intravenous Daily 02/28/24 1440 03/09/24 1013   02/28/24 0000  daptomycin  (CUBICIN ) IVPB  Status:  Discontinued        600 mg Intravenous Every 24 hours 02/28/24 1444 03/09/24    02/28/24 0000  ceFEPime  (MAXIPIME ) IVPB  Status:  Discontinued        2 g Intravenous Every 8 hours 02/28/24 1444 03/09/24    02/23/24 2200  vancomycin  (VANCOREADY) IVPB 1500 mg/300 mL  Status:  Discontinued        1,500 mg 150 mL/hr over 120 Minutes Intravenous Every 24 hours 02/22/24 2348 02/23/24 0242   02/23/24 1000  linezolid  (ZYVOX ) IVPB 600 mg  Status:  Discontinued       Note to Pharmacy: Pt  developed rash shortly after vancomycin  administration.   600 mg 300 mL/hr over 60 Minutes Intravenous Every 12 hours 02/23/24 0242 02/28/24 1440   02/23/24 0800  piperacillin -tazobactam (ZOSYN ) IVPB 3.375 g  Status:  Discontinued        3.375 g 12.5 mL/hr over 240 Minutes Intravenous Every 8 hours 02/22/24 2348 02/28/24 1440   02/22/24 2315  piperacillin -tazobactam (ZOSYN ) IVPB 3.375 g        3.375 g 100 mL/hr over 30 Minutes Intravenous  Once 02/22/24 2306 02/23/24 0013   02/22/24 2315  vancomycin  (VANCOREADY) IVPB 1500 mg/300 mL        1,500 mg 150 mL/hr  over 120 Minutes Intravenous  Once 02/22/24 2306 02/23/24 0141       Subjective: Patient was seen and examined at bedside.  Overnight events noted. Patient seems much improved, following commands. Son is at bedside. He has visited the facility and Liked it.    Objective: Vitals:   03/27/24 0459 03/27/24 0706 03/27/24 0747 03/27/24 1253  BP: 108/62 (!) 89/62 105/64 98/73  Pulse: (!) 132 (!) 124 (!) 130 (!) 117  Resp: 16 18 20 20   Temp: 97.9 F (36.6 C) 98.1 F (36.7 C) (!) 97.5 F (36.4 C) 98 F (36.7 C)  TempSrc: Oral Oral Oral Oral  SpO2: 96% 97% 100% 97%  Weight:      Height:        Intake/Output Summary (Last 24 hours) at 03/27/2024 1407 Last data filed at 03/27/2024 0800 Gross per 24 hour  Intake 620.38 ml  Output 400 ml  Net 220.38 ml   Filed Weights   02/22/24 1238 02/24/24 1245 03/19/24 0540  Weight: 79.4 kg 79.3 kg 79.3 kg    Examination:  General exam: Appears calm and comfortable, deconditioned, not in any acute distress. Respiratory system: Decreased breath sounds. Respiratory effort normal.  RR 15 Cardiovascular system: S1 & S2 heard, RRR. No JVD, murmurs, rubs, gallops or clicks.  Gastrointestinal system: Abdomen is non distended, soft and non tender.  Normal bowel sounds heard. Central nervous system: Arousable, Not following commands . Extremities: No edema, no cyanosis, no clubbing. Skin: No  rashes, lesions or ulcers Psychiatry: Judgement and insight appear normal. Mood & affect appropriate.     Data Reviewed: I have personally reviewed following labs and imaging studies  CBC: Recent Labs  Lab 03/25/24 0640 03/26/24 0500 03/27/24 0500  WBC 5.3 6.0 4.3  HGB 8.5* 8.2* 7.7*  HCT 28.4* 26.2* 24.5*  MCV 95.3 92.6 92.8  PLT 247 242 210   Basic Metabolic Panel: Recent Labs  Lab 03/21/24 0833 03/23/24 0500 03/25/24 0640 03/26/24 0500 03/27/24 0500  NA 146* 151* 156* 154* 151*  K 3.6 3.0* 3.3* 3.1* 3.6  CL 113* 115* 121* 120* 118*  CO2 21* 22 23 24 24   GLUCOSE 78 98 96 117* 144*  BUN 19 21 22  24* 25*  CREATININE 1.64* 1.56* 1.92* 2.11* 2.13*  CALCIUM  9.3 9.2 8.9 8.4* 8.6*  MG  --   --  1.8 1.7 1.8  PHOS  --   --  3.9 3.9 3.3   GFR: Estimated Creatinine Clearance: 26.3 mL/min (A) (by C-G formula based on SCr of 2.13 mg/dL (H)). Liver Function Tests: Recent Labs  Lab 03/21/24 0833 03/23/24 0500 03/25/24 0640 03/27/24 0500  AST 61* 79* 163* 84*  ALT 84* 69* 82* 65*  ALKPHOS 82 76 79 80  BILITOT 0.7 0.6 0.5 0.5  PROT 5.3* 5.2* 5.2* 4.9*  ALBUMIN  2.6* 2.5* 2.5* 2.3*   No results for input(s): LIPASE, AMYLASE in the last 168 hours. No results for input(s): AMMONIA in the last 168 hours. Coagulation Profile: No results for input(s): INR, PROTIME in the last 168 hours. Cardiac Enzymes: Recent Labs  Lab 03/23/24 0500 03/26/24 0500  CKTOTAL 995* 776*   BNP (last 3 results) No results for input(s): PROBNP in the last 8760 hours. HbA1C: No results for input(s): HGBA1C in the last 72 hours. CBG: Recent Labs  Lab 03/26/24 1133 03/26/24 1642 03/26/24 2129 03/27/24 0741 03/27/24 1119  GLUCAP 102* 122* 120* 101* 110*   Lipid Profile: No results for input(s): CHOL, HDL, LDLCALC, TRIG,  CHOLHDL, LDLDIRECT in the last 72 hours. Thyroid  Function Tests: No results for input(s): TSH, T4TOTAL, FREET4, T3FREE, THYROIDAB  in the last 72 hours. Anemia Panel: No results for input(s): VITAMINB12, FOLATE, FERRITIN, TIBC, IRON, RETICCTPCT in the last 72 hours. Sepsis Labs: No results for input(s): PROCALCITON, LATICACIDVEN in the last 168 hours.  No results found for this or any previous visit (from the past 240 hours).   Radiology Studies: DG Chest Port 1 View Result Date: 03/26/2024 CLINICAL DATA:  Cough. EXAM: PORTABLE CHEST 1 VIEW COMPARISON:  None Available. FINDINGS: There is stable right-sided PICC line positioning. The cardiac silhouette is mildly enlarged and unchanged in size. Stable mild to moderate severity left basilar atelectasis and/or infiltrate is suspected. A stable, small to moderate sized left pleural effusion is also seen. No pneumothorax is identified. The visualized skeletal structures are unremarkable. IMPRESSION: 1. Stable mild to moderate severity left basilar atelectasis and/or infiltrate. 2. Stable, small to moderate sized left pleural effusion. Electronically Signed   By: Suzen Dials M.D.   On: 03/26/2024 17:05   DG CHEST PORT 1 VIEW Result Date: 03/25/2024 EXAM: 1 VIEW(S) XRAY OF THE CHEST 03/25/2024 05:34:00 PM COMPARISON: 02/22/2024 CLINICAL HISTORY: Cough. ICD10: 10031 Cough. FINDINGS: LINES, TUBES AND DEVICES: Right PICC with tip overlying the superior cavoatrial junction. LUNGS AND PLEURA: Left pleural effusion. Hazy left base opacity or atelectasis. No pneumothorax. HEART AND MEDIASTINUM: Cardiomegaly. Calcified aorta. BONES AND SOFT TISSUES: No acute osseous abnormality. IMPRESSION: 1. Left pleural effusion with hazy left basilar opacity or atelectasis. Recommend PA and lateral views of the chest for further evaluation. 2. Right PICC tip overlies the superior cavoatrial junction. Electronically signed by: Morgane Naveau MD 03/25/2024 08:27 PM EST RP Workstation: [id number]    Scheduled Meds:  allopurinol   100 mg Oral Daily   apixaban   2.5 mg Oral BID    carvedilol   3.125 mg Oral BID   Chlorhexidine  Gluconate Cloth  6 each Topical Daily   cycloSPORINE   1 drop Both Eyes Daily   feeding supplement  237 mL Oral BID BM   flecainide   50 mg Oral BID   folic acid   1 mg Oral Daily   Gerhardt's butt cream  1 Application Topical TID   hydrALAZINE   25 mg Oral BID   hydrocortisone  cream   Topical TID   insulin  aspart  0-6 Units Subcutaneous TID AC & HS   linezolid   600 mg Oral Q12H   montelukast   10 mg Oral QHS   multivitamin with minerals  1 tablet Oral Daily   potassium chloride   20 mEq Oral Daily   senna-docusate  1 tablet Oral QHS   sodium chloride  flush  3 mL Intravenous Q12H   tamsulosin   0.4 mg Oral QPC supper   thiamine   100 mg Oral Daily   Continuous Infusions:  ertapenem  1 g (03/27/24 1121)     LOS: 34 days    Time spent: 35 mins    Darcel Dawley, MD [facility]   If 7PM-7AM, please contact night-coverage  "

## 2024-03-28 ENCOUNTER — Other Ambulatory Visit: Payer: Self-pay | Admitting: Internal Medicine

## 2024-03-28 DIAGNOSIS — K6812 Psoas muscle abscess: Secondary | ICD-10-CM | POA: Diagnosis not present

## 2024-03-28 LAB — CBC
HCT: 26.4 % — ABNORMAL LOW (ref 39.0–52.0)
Hemoglobin: 7.8 g/dL — ABNORMAL LOW (ref 13.0–17.0)
MCH: 28.5 pg (ref 26.0–34.0)
MCHC: 29.5 g/dL — ABNORMAL LOW (ref 30.0–36.0)
MCV: 96.4 fL (ref 80.0–100.0)
Platelets: 203 10*3/uL (ref 150–400)
RBC: 2.74 MIL/uL — ABNORMAL LOW (ref 4.22–5.81)
RDW: 18.7 % — ABNORMAL HIGH (ref 11.5–15.5)
WBC: 3.9 10*3/uL — ABNORMAL LOW (ref 4.0–10.5)
nRBC: 0 % (ref 0.0–0.2)

## 2024-03-28 LAB — PHOSPHORUS: Phosphorus: 4.5 mg/dL (ref 2.5–4.6)

## 2024-03-28 LAB — BASIC METABOLIC PANEL WITH GFR
Anion gap: 9 (ref 5–15)
BUN: 25 mg/dL — ABNORMAL HIGH (ref 8–23)
CO2: 24 mmol/L (ref 22–32)
Calcium: 9.1 mg/dL (ref 8.9–10.3)
Chloride: 120 mmol/L — ABNORMAL HIGH (ref 98–111)
Creatinine, Ser: 2.13 mg/dL — ABNORMAL HIGH (ref 0.61–1.24)
GFR, Estimated: 31 mL/min — ABNORMAL LOW
Glucose, Bld: 117 mg/dL — ABNORMAL HIGH (ref 70–99)
Potassium: 4 mmol/L (ref 3.5–5.1)
Sodium: 153 mmol/L — ABNORMAL HIGH (ref 135–145)

## 2024-03-28 LAB — GLUCOSE, CAPILLARY
Glucose-Capillary: 84 mg/dL (ref 70–99)
Glucose-Capillary: 97 mg/dL (ref 70–99)
Glucose-Capillary: 102 mg/dL — ABNORMAL HIGH (ref 70–99)
Glucose-Capillary: 102 mg/dL — ABNORMAL HIGH (ref 70–99)

## 2024-03-28 LAB — MAGNESIUM: Magnesium: 2 mg/dL (ref 1.7–2.4)

## 2024-03-28 MED ORDER — SODIUM CHLORIDE 0.9 % IV BOLUS
250.0000 mL | Freq: Once | INTRAVENOUS | Status: AC
  Administered 2024-03-28: 250 mL via INTRAVENOUS

## 2024-03-28 MED ORDER — DEXTROSE 5 % IV SOLN: INTRAVENOUS | Status: AC

## 2024-03-28 NOTE — Progress Notes (Signed)
 " PROGRESS NOTE    Charles Lloyd Juanito Mickey.  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 He was found to have a 3.5 x 2.4 cm L psoas muscle abscess. Neurosurgery recommended IR drainage and IV antibiotics. Underwent CT-guided aspiration on 02/24/24. Developed hospital-associated delirium requiring ICU transfer, now improved. Currently on antibiotics through 04/10/2024 as per ID recommendations. Psychiatry consulted on 03/06/2024 for intermittent agitation and as per family request. Palliative care also consulted for goals of care discussion.   Patient remains medically stable for discharge at this point - mental status (while waxing and waning) has continued to stabilize with poorly intelligible speech but able to follow commands. Will likely need prolonged therapy in regards to speech and physical/occupational - discharge to SNF  given insurance Auth and bed approved.  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 - Patient noted to have recent transverse lumbar interbody fusion on 02/06/2024. - Patient seen in consultation by IR and patient underwent CT-guided  aspiration of left psoas fluid collection with drain placement. - Cultures remain negative. - Infectious disease following, transitioned back  to daptomycin  given resolution of CK, added ertapenem  through 04/10/2024. - Continue linezolid  and ertapenem  through 04/10/2024.   Acute metabolic encephalopathy: Delirium / alcohol  withdrawal syndrome with delirium / possible underlying dementia - Initial agitation likely in the setting of infection. - Subsequent medications including benzodiazepine, Precedex , phenobarbital  with no real improvement in patient's mental status and ongoing agitation -given age polypharmacy is of concern. - Subsequent renal dysfunction and liver dysfunction in the setting of multiple medications and infection likely contributing as well. - MRI 02/24/2024 negative, repeat CT head on 03/10/2024 negative for acute process. - Labs unremarkable including ammonia, B12, TSH, - EEG unrevealing -consistent with metabolic/toxic etiology. - Psychiatry consulted per family request 03/06/2024 -initiated on Rozerem  with subsequent somnolence and stupor with questionable tremors and poor p.o. intake -subsequently discontinued. - Long longstanding gabapentin  also discontinued in this timeframe. - Continue to follow patient clinically given ongoing improvement, although mild, over the past few days.  -Continue advance diet as tolerated. - Patient seems improved, following commands.    Acute kidney injury : Hypovolemic hyponatremia, resolved. Metabolic acidosis, resolved. - Likely multifactorial in setting of poor p.o. 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 - Renal function improving daily with increased p.o. intake and IV fluids. - ANA, ANCA -within normal limits, kappa lambda light chains noted to be mildly elevated but ratio within normal limits - Nephrology was following but have signed off.  - Bicarb tablets discontinued.  Renal  functions slightly up today.   Elevated LFTs, resolving -Likely multifactorial similar to  a late AKI as above in the setting of poor p.o. intake, hypovolemia, hypotension and medication injury - Hepatitis panel negative other than reactive hepatitis C antibody(false positive given subsequent confirmatory tests negative) - Imaging unremarkable other than other than hepatic steatosis and slightly echogenic liver, nonspecific. - Statin discontinued. - Restart daptomycin  given discussion with ID, vancomycin  discontinued. - GI previously consulted with concern for drug-induced liver injury. - Monitor LFTS.   Paroxysmal atrial fibrillation - Hold Coreg  and flecainide  due to hypotension. - Continue Eliquis  for anticoagulation.   Chronic diastolic heart failure : Hypertension/Hyperlipidemia: - Hold hydralazine , Coreg  due to hypotension. - Resume diuretics at discharge as below. 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  Well controlled. - Continue to hold gabapentin  secondary to somnolence/drowsiness.   Asthma: -Stable, not in acute exacerbation. - Continue montelukast .   Recurrent falls: -PT/OT. - Tentative plan for discharge to SNF   Acute on chronic urinary retention: - Patient noted to have failed voiding trial on 03/04/2024 and Foley catheter had to be placed on 03/04/2024. - Continue Flomax . - Plan to discharge with Foley catheter in place with outpatient follow-up with urology.   Moderate malnutrition - advance diet as tolerated  Anemia of chronic disease - Hemoglobin stable.   Pressure injury, POA Wound 02/24/24 1245 Pressure Injury Buttocks Mid;Lower Deep Tissue Pressure Injury - Purple or maroon localized area of discolored intact skin or blood-filled blister due to damage of underlying soft tissue from pressure and/or shear. (Active)     Wound 02/24/24 1245 Pressure  Injury Scrotum Circumferential Deep Tissue Pressure Injury - Purple or maroon localized area of discolored intact skin or blood-filled blister due to damage of underlying soft tissue from pressure and/or shear. (Active)    Hypernatremia: Continue D5W @ 70 cc/ hr , sodium slowly improving. 156> 154> 151 >153   DVT prophylaxis: (Eliquis ) Code Status: DNR Family Communication: No family at bed side. Disposition Plan:     Status is: Inpatient Remains inpatient appropriate because: Severity of illness.  Patient was discharged but no transport was available during the weekend.  Patient has hypernatremia,  started on D5W.  Patient is not medically ready for discharge.      Consultants:  None  Procedures: None  Antimicrobials:  Anti-infectives (From admission, onward)    Start     Dose/Rate Route Frequency Ordered Stop   03/23/24 1000  linezolid  (ZYVOX ) tablet 600 mg        600 mg Oral Every 12 hours 03/23/24 0757     03/23/24 0000  linezolid  (ZYVOX ) 600 MG tablet        600 mg Oral Every 12 hours 03/23/24 0823 04/10/24 2359   03/21/24 0000  daptomycin  (CUBICIN ) IVPB  Status:  Discontinued        600 mg Intravenous Every 24 hours 03/21/24 1215 03/23/24    03/21/24 0000  ertapenem  (INVANZ ) IVPB        1 g Intravenous Every 24 hours 03/21/24 1215 04/18/24 2359   03/19/24 1400  DAPTOmycin  (CUBICIN ) 600 mg in sodium chloride  0.9 % IVPB  Status:  Discontinued        8 mg/kg  79.3 kg 124 mL/hr over 30 Minutes Intravenous Daily 03/18/24 1400 03/23/24 0757   03/18/24 1600  DAPTOmycin  (CUBICIN ) IVPB 500 mg/7mL premix        500 mg 100 mL/hr over 30 Minutes Intravenous  Once 03/18/24 1400  03/18/24 1800   03/14/24 1500  vancomycin  (VANCOCIN ) IVPB 1000 mg/200 mL premix  Status:  Discontinued        1,000 mg 200 mL/hr over 60 Minutes Intravenous Every 24 hours 03/14/24 0935 03/18/24 1400   03/13/24 1400  DAPTOmycin  (CUBICIN ) 600 mg in sodium chloride  0.9 % IVPB  Status:  Discontinued         8 mg/kg  79.3 kg 124 mL/hr over 30 Minutes Intravenous Daily 03/13/24 1034 03/14/24 0908   03/13/24 1115  ertapenem  (INVANZ ) 1 g in sodium chloride  0.9 % 100 mL IVPB        1 g 200 mL/hr over 30 Minutes Intravenous Every 24 hours 03/13/24 1034     03/11/24 1000  ciprofloxacin  (CIPRO ) IVPB 400 mg  Status:  Discontinued        400 mg 200 mL/hr over 60 Minutes Intravenous Every 24 hours 03/10/24 1456 03/13/24 1034   03/10/24 1400  DAPTOmycin  (CUBICIN ) 600 mg in sodium chloride  0.9 % IVPB  Status:  Discontinued        8 mg/kg  79.3 kg 124 mL/hr over 30 Minutes Intravenous Every 48 hours 03/09/24 1013 03/13/24 1034   03/10/24 1000  ceFEPIme  (MAXIPIME ) 2 g in sodium chloride  0.9 % 100 mL IVPB  Status:  Discontinued        2 g 200 mL/hr over 30 Minutes Intravenous Every 24 hours 03/09/24 1013 03/10/24 1456   03/07/24 1800  ceFEPIme  (MAXIPIME ) 2 g in sodium chloride  0.9 % 100 mL IVPB  Status:  Discontinued        2 g 200 mL/hr over 30 Minutes Intravenous Every 12 hours 03/07/24 1118 03/09/24 1013   02/28/24 2200  ceFEPIme  (MAXIPIME ) 2 g in sodium chloride  0.9 % 100 mL IVPB  Status:  Discontinued        2 g 200 mL/hr over 30 Minutes Intravenous Every 8 hours 02/28/24 1440 03/07/24 1118   02/28/24 1530  DAPTOmycin  (CUBICIN ) 600 mg in sodium chloride  0.9 % IVPB  Status:  Discontinued        8 mg/kg  79.3 kg 124 mL/hr over 30 Minutes Intravenous Daily 02/28/24 1440 03/09/24 1013   02/28/24 0000  daptomycin  (CUBICIN ) IVPB  Status:  Discontinued        600 mg Intravenous Every 24 hours 02/28/24 1444 03/09/24    02/28/24 0000  ceFEPime  (MAXIPIME ) IVPB  Status:  Discontinued        2 g Intravenous Every 8 hours 02/28/24 1444 03/09/24    02/23/24 2200  vancomycin  (VANCOREADY) IVPB 1500 mg/300 mL  Status:  Discontinued        1,500 mg 150 mL/hr over 120 Minutes Intravenous Every 24 hours 02/22/24 2348 02/23/24 0242   02/23/24 1000  linezolid  (ZYVOX ) IVPB 600 mg  Status:  Discontinued       Note  to Pharmacy: Pt developed rash shortly after vancomycin  administration.   600 mg 300 mL/hr over 60 Minutes Intravenous Every 12 hours 02/23/24 0242 02/28/24 1440   02/23/24 0800  piperacillin -tazobactam (ZOSYN ) IVPB 3.375 g  Status:  Discontinued        3.375 g 12.5 mL/hr over 240 Minutes Intravenous Every 8 hours 02/22/24 2348 02/28/24 1440   02/22/24 2315  piperacillin -tazobactam (ZOSYN ) IVPB 3.375 g        3.375 g 100 mL/hr over 30 Minutes Intravenous  Once 02/22/24 2306 02/23/24 0013   02/22/24 2315  vancomycin  (VANCOREADY) IVPB 1500 mg/300 mL  1,500 mg 150 mL/hr over 120 Minutes Intravenous  Once 02/22/24 2306 02/23/24 0141       Subjective: Patient was seen and examined at bedside.  Overnight events noted. Patient seems improved, following commands. His BP has been running low,   Objective: Vitals:   03/27/24 2221 03/28/24 0534 03/28/24 1020 03/28/24 1158  BP: 105/65 92/62 93/77  93/65  Pulse: (!) 116 (!) 58 (!) 124 (!) 120  Resp: 16 14    Temp: 97.9 F (36.6 C) 97.7 F (36.5 C)    TempSrc: Oral Oral    SpO2: 97% 95%    Weight:      Height:        Intake/Output Summary (Last 24 hours) at 03/28/2024 1208 Last data filed at 03/28/2024 1102 Gross per 24 hour  Intake 189.33 ml  Output 375 ml  Net -185.67 ml   Filed Weights   02/22/24 1238 02/24/24 1245 03/19/24 0540  Weight: 79.4 kg 79.3 kg 79.3 kg    Examination:  General exam: Appears calm and comfortable, deconditioned, not in any acute distress. Respiratory system: Decreased breath sounds. Respiratory effort normal.  RR 14 Cardiovascular system: S1 & S2 heard, RRR. No JVD, murmurs, rubs, gallops or clicks.  Gastrointestinal system: Abdomen is non distended, soft and non tender.  Normal bowel sounds heard. Central nervous system: Arousable, Not following commands . Extremities: No edema, no cyanosis, no clubbing. Skin: No rashes, lesions or ulcers Psychiatry: Judgement and insight appear normal. Mood &  affect appropriate.    Data Reviewed: I have personally reviewed following labs and imaging studies  CBC: Recent Labs  Lab 03/25/24 0640 03/26/24 0500 03/27/24 0500 03/28/24 1050  WBC 5.3 6.0 4.3 3.9*  HGB 8.5* 8.2* 7.7* 7.8*  HCT 28.4* 26.2* 24.5* 26.4*  MCV 95.3 92.6 92.8 96.4  PLT 247 242 210 203   Basic Metabolic Panel: Recent Labs  Lab 03/23/24 0500 03/25/24 0640 03/26/24 0500 03/27/24 0500 03/28/24 1050  NA 151* 156* 154* 151* 153*  K 3.0* 3.3* 3.1* 3.6 4.0  CL 115* 121* 120* 118* 120*  CO2 22 23 24 24 24   GLUCOSE 98 96 117* 144* 117*  BUN 21 22 24* 25* 25*  CREATININE 1.56* 1.92* 2.11* 2.13* 2.13*  CALCIUM  9.2 8.9 8.4* 8.6* 9.1  MG  --  1.8 1.7 1.8 2.0  PHOS  --  3.9 3.9 3.3 4.5   GFR: Estimated Creatinine Clearance: 26.3 mL/min (A) (by C-G formula based on SCr of 2.13 mg/dL (H)). Liver Function Tests: Recent Labs  Lab 03/23/24 0500 03/25/24 0640 03/27/24 0500  AST 79* 163* 84*  ALT 69* 82* 65*  ALKPHOS 76 79 80  BILITOT 0.6 0.5 0.5  PROT 5.2* 5.2* 4.9*  ALBUMIN  2.5* 2.5* 2.3*   No results for input(s): LIPASE, AMYLASE in the last 168 hours. No results for input(s): AMMONIA in the last 168 hours. Coagulation Profile: No results for input(s): INR, PROTIME in the last 168 hours. Cardiac Enzymes: Recent Labs  Lab 03/23/24 0500 03/26/24 0500  CKTOTAL 995* 776*   BNP (last 3 results) No results for input(s): PROBNP in the last 8760 hours. HbA1C: No results for input(s): HGBA1C in the last 72 hours. CBG: Recent Labs  Lab 03/27/24 1119 03/27/24 1553 03/27/24 2218 03/28/24 0728 03/28/24 1129  GLUCAP 110* 97 102* 84 97   Lipid Profile: No results for input(s): CHOL, HDL, LDLCALC, TRIG, CHOLHDL, LDLDIRECT in the last 72 hours. Thyroid  Function Tests: No results for input(s): TSH, T4TOTAL, FREET4, T3FREE,  THYROIDAB in the last 72 hours. Anemia Panel: No results for input(s): VITAMINB12, FOLATE,  FERRITIN, TIBC, IRON, RETICCTPCT in the last 72 hours. Sepsis Labs: No results for input(s): PROCALCITON, LATICACIDVEN in the last 168 hours.  No results found for this or any previous visit (from the past 240 hours).   Radiology Studies: No results found.   Scheduled Meds:  allopurinol   100 mg Oral Daily   apixaban   2.5 mg Oral BID   carvedilol   3.125 mg Oral BID   Chlorhexidine  Gluconate Cloth  6 each Topical Daily   cycloSPORINE   1 drop Both Eyes Daily   feeding supplement  237 mL Oral BID BM   flecainide   50 mg Oral BID   folic acid   1 mg Oral Daily   Gerhardt's butt cream  1 Application Topical TID   hydrALAZINE   25 mg Oral BID   hydrocortisone  cream   Topical TID   insulin  aspart  0-6 Units Subcutaneous TID AC & HS   linezolid   600 mg Oral Q12H   montelukast   10 mg Oral QHS   multivitamin with minerals  1 tablet Oral Daily   potassium chloride   20 mEq Oral Daily   senna-docusate  1 tablet Oral QHS   sodium chloride  flush  3 mL Intravenous Q12H   tamsulosin   0.4 mg Oral QPC supper   thiamine   100 mg Oral Daily   Continuous Infusions:  dextrose      ertapenem  1 g (03/28/24 1154)     LOS: 35 days    Time spent: 35 mins    Darcel Dawley, MD [facility]   If 7PM-7AM, please contact night-coverage  "

## 2024-03-28 NOTE — Progress Notes (Signed)
" °   03/27/24 2249  Provider Notification  Provider Name/Title Andrez, NP  Date Provider Notified 03/27/24  Time Provider Notified 2249  Method of Notification Page (secure chat)  Notification Reason Other (Comment) (PMH Afib was in Afib wRVR this AM was given his Coreg  at 0945 this morning HR 110's to 130's throughout the day tonight 130s occasional 140 given Coreg  at 2128 HR now 120-148)  Provider response See new orders  Date of Provider Response 03/27/24  Time of Provider Response 2300    "

## 2024-03-28 NOTE — TOC Progression Note (Signed)
 Transition of Care (TOC) - Progression Note    Patient Details  Name: Charles Lloyd. MRN: [id number] Date of Birth: 1940-07-25  Transition of [facility]) CM/SW Contact  Tawni CHRISTELLA Eva, LCSW Phone Number: 03/28/2024, 11:00 AM  Clinical Narrative:     Pt's insurance auth for SNF placement at [facility] rehab was approved. Shara [id number], 03/26/2024-03/30/2024. Pt not medically stable ICM to follow.   Expected Discharge Plan: Skilled Nursing Facility Barriers to Discharge: Barriers Resolved               Expected Discharge Plan and Services In-house Referral: NA Discharge Planning Services: CM Consult Post Acute Care Choice: Skilled Nursing Facility Living arrangements for the past 2 months: Single Family Home Expected Discharge Date: 03/21/24                                     Social Drivers of Health (SDOH) Interventions SDOH Screenings   Food Insecurity: No Food Insecurity (02/23/2024)  Housing: Low Risk (02/23/2024)  Transportation Needs: No Transportation Needs (02/23/2024)  Utilities: Not At Risk (02/23/2024)  Alcohol  Screen: Low Risk (12/21/2022)  Depression (PHQ2-9): Low Risk (02/11/2024)  Financial Resource Strain: Low Risk (12/21/2022)  Physical Activity: Inactive (12/21/2022)  Social Connections: Socially Isolated (02/23/2024)  Stress: No Stress Concern Present (12/21/2022)  Tobacco Use: Low Risk (02/22/2024)  Health Literacy: Adequate Health Literacy (12/21/2022)    Readmission Risk Interventions    02/26/2024    2:28 PM 08/08/2021   10:00 AM  Readmission Risk Prevention Plan  Transportation Screening Complete Complete  PCP or Specialist Appt within 3-5 Days Complete   HRI or Home Care Consult Complete   Social Work Consult for Recovery Care Planning/Counseling Complete   Palliative Care Screening Not Applicable   Medication Review Oceanographer) Complete Complete  PCP or Specialist appointment within 3-5 days of discharge   Complete  HRI or Home Care Consult  Complete  SW Recovery Care/Counseling Consult  Not Complete  Palliative Care Screening  Not Applicable  Skilled Nursing Facility  Not Applicable

## 2024-03-29 DIAGNOSIS — K6812 Psoas muscle abscess: Secondary | ICD-10-CM | POA: Diagnosis not present

## 2024-03-29 LAB — BASIC METABOLIC PANEL WITH GFR
Anion gap: 11 (ref 5–15)
BUN: 25 mg/dL — ABNORMAL HIGH (ref 8–23)
CO2: 23 mmol/L (ref 22–32)
Calcium: 8.7 mg/dL — ABNORMAL LOW (ref 8.9–10.3)
Chloride: 120 mmol/L — ABNORMAL HIGH (ref 98–111)
Creatinine, Ser: 2.23 mg/dL — ABNORMAL HIGH (ref 0.61–1.24)
GFR, Estimated: 29 mL/min — ABNORMAL LOW
Glucose, Bld: 115 mg/dL — ABNORMAL HIGH (ref 70–99)
Potassium: 3.9 mmol/L (ref 3.5–5.1)
Sodium: 153 mmol/L — ABNORMAL HIGH (ref 135–145)

## 2024-03-29 LAB — CBC
HCT: 25.7 % — ABNORMAL LOW (ref 39.0–52.0)
Hemoglobin: 8.1 g/dL — ABNORMAL LOW (ref 13.0–17.0)
MCH: 29.5 pg (ref 26.0–34.0)
MCHC: 31.5 g/dL (ref 30.0–36.0)
MCV: 93.5 fL (ref 80.0–100.0)
Platelets: 197 10*3/uL (ref 150–400)
RBC: 2.75 MIL/uL — ABNORMAL LOW (ref 4.22–5.81)
RDW: 18.8 % — ABNORMAL HIGH (ref 11.5–15.5)
WBC: 4.1 10*3/uL (ref 4.0–10.5)
nRBC: 0 % (ref 0.0–0.2)

## 2024-03-29 LAB — GLUCOSE, CAPILLARY
Glucose-Capillary: 93 mg/dL (ref 70–99)
Glucose-Capillary: 125 mg/dL — ABNORMAL HIGH (ref 70–99)
Glucose-Capillary: 116 mg/dL — ABNORMAL HIGH (ref 70–99)
Glucose-Capillary: 125 mg/dL — ABNORMAL HIGH (ref 70–99)

## 2024-03-29 LAB — CK: Total CK: 544 U/L — ABNORMAL HIGH (ref 49–397)

## 2024-03-29 MED ORDER — SODIUM CHLORIDE 0.9 % IV SOLN
500.0000 mg | INTRAVENOUS | Status: DC
  Administered 2024-03-29 – 2024-03-31 (×3): 500 mg via INTRAVENOUS
  Filled 2024-03-29 (×5): qty 500

## 2024-03-29 NOTE — Progress Notes (Signed)
 " PROGRESS NOTE    Charles Lloyd Charles Lloyd.  FMW:[id number] DOB: 1940/10/02 DOA: 02/22/2024 PCP: Theophilus Andrews, Tully GRADE, MD   Brief Narrative:  This 84 yrs old Male with h/o HTN, HLD, T2DM, afib on Eliquis , and L4-5 spondylolisthesis s/p fusion on 12/11 who presented with LLE weakness and pain with falls. He was found to have a 3.5 x 2.4 cm L psoas muscle abscess. Neurosurgery recommended IR drainage and IV antibiotics. Underwent CT-guided aspiration on 02/24/24. Developed hospital-associated delirium requiring ICU transfer, now improved. Currently on antibiotics through 04/10/2024 as per ID recommendations. Psychiatry consulted on 03/06/2024 for intermittent agitation and as per family request. Palliative care also consulted for goals of care discussion.   Patient remains medically stable for discharge at this point - mental status (while waxing and waning) has continued to stabilize with poorly intelligible speech but able to follow commands. Will likely need prolonged therapy in regards to speech and physical/occupational - discharge to SNF  given insurance Auth and bed approved.  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 - Patient noted to have recent transverse lumbar interbody fusion on 02/06/2024. - Patient seen in consultation by IR and patient underwent CT-guided  aspiration of left psoas fluid collection with drain placement. - Cultures remain negative. - Infectious disease following, transitioned back  to daptomycin  given resolution of CK, added ertapenem  through 04/10/2024. - Continue linezolid  and ertapenem  through 04/10/2024.   Acute metabolic encephalopathy: Delirium / alcohol  withdrawal syndrome with delirium / possible underlying dementia - Initial agitation likely in the setting of infection. - Subsequent medications including benzodiazepine, Precedex , phenobarbital  with no real improvement in patient's mental status and ongoing agitation -given age polypharmacy is of concern. - Subsequent renal dysfunction and liver dysfunction in the setting of multiple medications and infection likely contributing as well. - MRI 02/24/2024 negative, repeat CT head on 03/10/2024 negative for acute process. - Labs unremarkable including ammonia, B12, TSH, - EEG unrevealing -consistent with metabolic/toxic etiology. - Psychiatry consulted per family request 03/06/2024 -initiated on Rozerem  with subsequent somnolence and stupor with questionable tremors and poor p.o. intake -subsequently discontinued. - Long longstanding gabapentin  also discontinued in this timeframe. - Continue to follow patient clinically given ongoing improvement, although mild, over the past few days.  -Continue advance diet as tolerated. - Patient seems improved, following commands.    Acute kidney injury : Hypovolemic hyponatremia, resolved. Metabolic acidosis, resolved. - Likely multifactorial in setting of poor p.o. 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 - Renal function improving daily with increased p.o. intake and IV fluids. - ANA, ANCA -within normal limits, kappa lambda light chains noted to be mildly elevated but ratio within normal limits - Nephrology was following but have signed off.  - Bicarb tablets discontinued.  Renal  functions slightly up today.   Elevated LFTs, resolving -Likely multifactorial similar to  a late AKI as above in the setting of poor p.o. intake, hypovolemia, hypotension and medication injury - Hepatitis panel negative other than reactive hepatitis C antibody(false positive given subsequent confirmatory tests negative) - Imaging unremarkable other than other than hepatic steatosis and slightly echogenic liver, nonspecific. - Statin discontinued. - Restart daptomycin  given discussion with ID, vancomycin  discontinued. - GI previously consulted with concern for drug-induced liver injury. - Monitor LFTS.   Paroxysmal atrial fibrillation - Continue Coreg  and flecainide  . - Continue Eliquis  for anticoagulation.   Chronic diastolic heart failure : Hypertension/Hyperlipidemia: - Continue hydralazine , Coreg . - Resume diuretics at discharge as below. 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  Well controlled. - Continue to hold gabapentin  secondary to somnolence/drowsiness.   Asthma: -Stable, not in acute exacerbation. - Continue montelukast .   Recurrent falls: -PT/OT. - Tentative plan for discharge to SNF   Acute on chronic urinary retention: - Patient noted to have failed voiding trial on 03/04/2024 and Foley catheter had to be placed on 03/04/2024. - Continue Flomax . - Plan to discharge with Foley catheter in place with outpatient follow-up with urology.   Moderate malnutrition - advance diet as tolerated  Anemia of chronic disease - Hemoglobin stable.   Pressure injury, POA Wound 02/24/24 1245 Pressure Injury Buttocks Mid;Lower Deep Tissue Pressure Injury - Purple or maroon localized area of discolored intact skin or blood-filled blister due to damage of underlying soft tissue from pressure and/or shear. (Active)     Wound 02/24/24 1245 Pressure Injury Scrotum Circumferential  Deep Tissue Pressure Injury - Purple or maroon localized area of discolored intact skin or blood-filled blister due to damage of underlying soft tissue from pressure and/or shear. (Active)    Hypernatremia: Continue D5W @ 70 cc/ hr , sodium slowly improving. 156> 154> 151 >153 Nephrology consulted.   DVT prophylaxis: (Eliquis ) Code Status: DNR Family Communication: No family at bed side. Disposition Plan:     Status is: Inpatient Remains inpatient appropriate because: Severity of illness.  Patient was discharged but no transport was available during the weekend.  Patient has hypernatremia,  started on D5W.  Patient is not medically ready for discharge.      Consultants:  Nephrology  Procedures: None  Antimicrobials:  Anti-infectives (From admission, onward)    Start     Dose/Rate Route Frequency Ordered Stop   03/29/24 1130  ertapenem  (INVANZ ) 500 mg in sodium chloride  0.9 % 50 mL IVPB        500 mg 100 mL/hr over 30 Minutes Intravenous Every 24 hours 03/29/24 0735     03/23/24 1000  linezolid  (ZYVOX ) tablet 600 mg        600 mg Oral Every 12 hours 03/23/24 0757     03/23/24 0000  linezolid  (ZYVOX ) 600 MG tablet        600 mg Oral Every 12 hours 03/23/24 0823 04/10/24 2359   03/21/24 0000  daptomycin  (CUBICIN ) IVPB  Status:  Discontinued        600 mg Intravenous Every 24 hours 03/21/24 1215 03/23/24    03/21/24 0000  ertapenem  (INVANZ ) IVPB        1 g Intravenous Every 24 hours 03/21/24 1215 04/18/24 2359   03/19/24 1400  DAPTOmycin  (CUBICIN ) 600 mg in sodium chloride  0.9 % IVPB  Status:  Discontinued        8 mg/kg  79.3 kg 124 mL/hr over 30 Minutes  Intravenous Daily 03/18/24 1400 03/23/24 0757   03/18/24 1600  DAPTOmycin  (CUBICIN ) IVPB 500 mg/50mL premix        500 mg 100 mL/hr over 30 Minutes Intravenous  Once 03/18/24 1400 03/18/24 1800   03/14/24 1500  vancomycin  (VANCOCIN ) IVPB 1000 mg/200 mL premix  Status:  Discontinued        1,000 mg 200 mL/hr over 60  Minutes Intravenous Every 24 hours 03/14/24 0935 03/18/24 1400   03/13/24 1400  DAPTOmycin  (CUBICIN ) 600 mg in sodium chloride  0.9 % IVPB  Status:  Discontinued        8 mg/kg  79.3 kg 124 mL/hr over 30 Minutes Intravenous Daily 03/13/24 1034 03/14/24 0908   03/13/24 1115  ertapenem  (INVANZ ) 1 g in sodium chloride  0.9 % 100 mL IVPB  Status:  Discontinued        1 g 200 mL/hr over 30 Minutes Intravenous Every 24 hours 03/13/24 1034 03/29/24 0735   03/11/24 1000  ciprofloxacin  (CIPRO ) IVPB 400 mg  Status:  Discontinued        400 mg 200 mL/hr over 60 Minutes Intravenous Every 24 hours 03/10/24 1456 03/13/24 1034   03/10/24 1400  DAPTOmycin  (CUBICIN ) 600 mg in sodium chloride  0.9 % IVPB  Status:  Discontinued        8 mg/kg  79.3 kg 124 mL/hr over 30 Minutes Intravenous Every 48 hours 03/09/24 1013 03/13/24 1034   03/10/24 1000  ceFEPIme  (MAXIPIME ) 2 g in sodium chloride  0.9 % 100 mL IVPB  Status:  Discontinued        2 g 200 mL/hr over 30 Minutes Intravenous Every 24 hours 03/09/24 1013 03/10/24 1456   03/07/24 1800  ceFEPIme  (MAXIPIME ) 2 g in sodium chloride  0.9 % 100 mL IVPB  Status:  Discontinued        2 g 200 mL/hr over 30 Minutes Intravenous Every 12 hours 03/07/24 1118 03/09/24 1013   02/28/24 2200  ceFEPIme  (MAXIPIME ) 2 g in sodium chloride  0.9 % 100 mL IVPB  Status:  Discontinued        2 g 200 mL/hr over 30 Minutes Intravenous Every 8 hours 02/28/24 1440 03/07/24 1118   02/28/24 1530  DAPTOmycin  (CUBICIN ) 600 mg in sodium chloride  0.9 % IVPB  Status:  Discontinued        8 mg/kg  79.3 kg 124 mL/hr over 30 Minutes Intravenous Daily 02/28/24 1440 03/09/24 1013   02/28/24 0000  daptomycin  (CUBICIN ) IVPB  Status:  Discontinued        600 mg Intravenous Every 24 hours 02/28/24 1444 03/09/24    02/28/24 0000  ceFEPime  (MAXIPIME ) IVPB  Status:  Discontinued        2 g Intravenous Every 8 hours 02/28/24 1444 03/09/24    02/23/24 2200  vancomycin  (VANCOREADY) IVPB 1500 mg/300 mL   Status:  Discontinued        1,500 mg 150 mL/hr over 120 Minutes Intravenous Every 24 hours 02/22/24 2348 02/23/24 0242   02/23/24 1000  linezolid  (ZYVOX ) IVPB 600 mg  Status:  Discontinued       Note to Pharmacy: Pt developed rash shortly after vancomycin  administration.   600 mg 300 mL/hr over 60 Minutes Intravenous Every 12 hours 02/23/24 0242 02/28/24 1440   02/23/24 0800  piperacillin -tazobactam (ZOSYN ) IVPB 3.375 g  Status:  Discontinued        3.375 g 12.5 mL/hr over 240 Minutes Intravenous Every 8 hours 02/22/24 2348 02/28/24 1440   02/22/24 2315  piperacillin -tazobactam (ZOSYN ) IVPB  3.375 g        3.375 g 100 mL/hr over 30 Minutes Intravenous  Once 02/22/24 2306 02/23/24 0013   02/22/24 2315  vancomycin  (VANCOREADY) IVPB 1500 mg/300 mL        1,500 mg 150 mL/hr over 120 Minutes Intravenous  Once 02/22/24 2306 02/23/24 0141       Subjective: Patient was seen and examined at bedside.  Overnight events noted. Patient seems improved, following commands.  Blood pressure has improved but slightly appears dehydrated and sodium is still elevated.  Objective: Vitals:   03/28/24 1353 03/28/24 1943 03/28/24 2124 03/29/24 0624  BP:  (!) 107/95 (!) 107/95 119/69  Pulse: 77 (!) 130 (!) 130 (!) 124  Resp:  15  14  Temp:  (!) 97.2 F (36.2 C)  (!) 97.5 F (36.4 C)  TempSrc:    Oral  SpO2: 95% 96%  98%  Weight:      Height:        Intake/Output Summary (Last 24 hours) at 03/29/2024 1134 Last data filed at 03/29/2024 0846 Gross per 24 hour  Intake 884.85 ml  Output --  Net 884.85 ml   Filed Weights   02/22/24 1238 02/24/24 1245 03/19/24 0540  Weight: 79.4 kg 79.3 kg 79.3 kg    Examination:  General exam: Appears calm and comfortable, deconditioned, not in any acute distress. Respiratory system: Decreased breath sounds. Respiratory effort normal.  RR 15 Cardiovascular system: S1 & S2 heard, RRR. No JVD, murmurs, rubs, gallops or clicks.  Gastrointestinal system: Abdomen is  non distended, soft and non tender.  Normal bowel sounds heard. Central nervous system: Arousable, Not following commands . Extremities: No edema, no cyanosis, no clubbing. Skin: No rashes, lesions or ulcers Psychiatry: Judgement and insight appear normal. Mood & affect appropriate.    Data Reviewed: I have personally reviewed following labs and imaging studies  CBC: Recent Labs  Lab 03/25/24 0640 03/26/24 0500 03/27/24 0500 03/28/24 1050 03/29/24 0614  WBC 5.3 6.0 4.3 3.9* 4.1  HGB 8.5* 8.2* 7.7* 7.8* 8.1*  HCT 28.4* 26.2* 24.5* 26.4* 25.7*  MCV 95.3 92.6 92.8 96.4 93.5  PLT 247 242 210 203 197   Basic Metabolic Panel: Recent Labs  Lab 03/25/24 0640 03/26/24 0500 03/27/24 0500 03/28/24 1050 03/29/24 0614  NA 156* 154* 151* 153* 153*  K 3.3* 3.1* 3.6 4.0 3.9  CL 121* 120* 118* 120* 120*  CO2 23 24 24 24 23   GLUCOSE 96 117* 144* 117* 115*  BUN 22 24* 25* 25* 25*  CREATININE 1.92* 2.11* 2.13* 2.13* 2.23*  CALCIUM  8.9 8.4* 8.6* 9.1 8.7*  MG 1.8 1.7 1.8 2.0  --   PHOS 3.9 3.9 3.3 4.5  --    GFR: Estimated Creatinine Clearance: 25.1 mL/min (A) (by C-G formula based on SCr of 2.23 mg/dL (H)). Liver Function Tests: Recent Labs  Lab 03/23/24 0500 03/25/24 0640 03/27/24 0500  AST 79* 163* 84*  ALT 69* 82* 65*  ALKPHOS 76 79 80  BILITOT 0.6 0.5 0.5  PROT 5.2* 5.2* 4.9*  ALBUMIN  2.5* 2.5* 2.3*   No results for input(s): LIPASE, AMYLASE in the last 168 hours. No results for input(s): AMMONIA in the last 168 hours. Coagulation Profile: No results for input(s): INR, PROTIME in the last 168 hours. Cardiac Enzymes: Recent Labs  Lab 03/23/24 0500 03/26/24 0500 03/29/24 0614  CKTOTAL 995* 776* 544*   BNP (last 3 results) No results for input(s): PROBNP in the last 8760 hours. HbA1C: No results  for input(s): HGBA1C in the last 72 hours. CBG: Recent Labs  Lab 03/28/24 0728 03/28/24 1129 03/28/24 1657 03/28/24 1956 03/29/24 0823  GLUCAP 84 97  102* 102* 93   Lipid Profile: No results for input(s): CHOL, HDL, LDLCALC, TRIG, CHOLHDL, LDLDIRECT in the last 72 hours. Thyroid  Function Tests: No results for input(s): TSH, T4TOTAL, FREET4, T3FREE, THYROIDAB in the last 72 hours. Anemia Panel: No results for input(s): VITAMINB12, FOLATE, FERRITIN, TIBC, IRON, RETICCTPCT in the last 72 hours. Sepsis Labs: No results for input(s): PROCALCITON, LATICACIDVEN in the last 168 hours.  No results found for this or any previous visit (from the past 240 hours).   Radiology Studies: No results found.   Scheduled Meds:  allopurinol   100 mg Oral Daily   apixaban   2.5 mg Oral BID   carvedilol   3.125 mg Oral BID   Chlorhexidine  Gluconate Cloth  6 each Topical Daily   cycloSPORINE   1 drop Both Eyes Daily   feeding supplement  237 mL Oral BID BM   flecainide   50 mg Oral BID   folic acid   1 mg Oral Daily   Gerhardt's butt cream  1 Application Topical TID   hydrALAZINE   25 mg Oral BID   hydrocortisone  cream   Topical TID   insulin  aspart  0-6 Units Subcutaneous TID AC & HS   linezolid   600 mg Oral Q12H   montelukast   10 mg Oral QHS   multivitamin with minerals  1 tablet Oral Daily   potassium chloride   20 mEq Oral Daily   senna-docusate  1 tablet Oral QHS   sodium chloride  flush  3 mL Intravenous Q12H   tamsulosin   0.4 mg Oral QPC supper   thiamine   100 mg Oral Daily   Continuous Infusions:  dextrose  50 mL/hr at 03/29/24 0309   ertapenem        LOS: 36 days    Time spent: 35 mins    Darcel Dawley, MD [facility]   If 7PM-7AM, please contact night-coverage  "

## 2024-03-29 NOTE — Plan of Care (Signed)

## 2024-03-29 NOTE — Progress Notes (Signed)
 Physical Therapy Treatment Patient Details Name: Charles Lloyd. MRN: [id number] DOB: November 13, 1940 Today's Date: 03/29/2024   History of Present Illness Charles Lloyd. is a 84 y.o. male who presented to the emergency department on 02/22/2024 for leg pain and weakness, with family reporting multiple falls over the past 6 to 8 months.  MRI lumbar spine revealed a new 3.5 x 2.4 cm fluid collection in the left psoas muscle concerning for abscess.  Neurosurgery was consulted at that time and recommended IV antibiotics and possible aspiration of abscess.  with significant PMH of hypertension, hyperlipidemia, type 2 diabetes, atrial fibrillation on anticoagulation, s/p recent L4-5 trans psoas lumbar interbody fusion left-sided approach on 02/06/2024, CHF,gout,CAD, TKA with infection, neuropathy    PT Comments  Pt generally cooperative but struggling to follow cues and fatigues easily.  This am, pt assist to roll and move to sitting EOB.  Pt sitting EOB with greatly fluctuating levels of assist to maintain balance - CGA for brief period to mod assist to correct for lean to L, R and posteriorly.  Standing not attempted for pt safety.    If plan is discharge home, recommend the following: Assist for transportation;Help with stairs or ramp for entrance;Assistance with cooking/housework;A lot of help with bathing/dressing/bathroom;Two people to help with walking and/or transfers   Can travel by private vehicle     No  Equipment Recommendations  None recommended by PT    Recommendations for Other Services Rehab consult     Precautions / Restrictions Precautions Precautions: Fall Recall of Precautions/Restrictions: Impaired Precaution/Restrictions Comments: trunk flexion at baseline Restrictions Weight Bearing Restrictions Per Provider Order: No     Mobility  Bed Mobility Overal bed mobility: Needs Assistance Bed Mobility: Rolling, Sidelying to Sit, Sit to Sidelying Rolling: Max assist, +2  for safety/equipment, +2 for physical assistance Sidelying to sit: Max assist, +2 for physical assistance, +2 for safety/equipment     Sit to sidelying: +2 for physical assistance, +2 for safety/equipment, Total assist General bed mobility comments: Some initiation with each task but significant assist for fallow through and completion    Transfers                   General transfer comment: NT 2* instability in sitting and limited follow through with cues.    Ambulation/Gait                   Stairs             Wheelchair Mobility     Tilt Bed    Modified Rankin (Stroke Patients Only)       Balance Overall balance assessment: Needs assistance Sitting-balance support: Feet supported, Bilateral upper extremity supported Sitting balance-Leahy Scale: Poor Sitting balance - Comments: Initial od assist, however improved to brief instances of CGA to min assist with pt seated EOB Postural control: Posterior lean, Right lateral lean, Left lateral lean                                  Communication Communication Communication: Impaired Factors Affecting Communication: Difficulty expressing self;Reduced clarity of speech  Cognition Arousal: Alert Behavior During Therapy: Flat affect   PT - Cognitive impairments: Safety/Judgement, Sequencing, Problem solving, Awareness, Memory, Attention, Initiation                       PT - Cognition Comments: patient awake  and looks at therapists, attempts to speak largely unintelligable Following commands: Impaired Following commands impaired: Follows one step commands inconsistently    Cueing Cueing Techniques: Verbal cues, Tactile cues, Gestural cues  Exercises      General Comments        Pertinent Vitals/Pain Pain Assessment Pain Assessment: Faces Faces Pain Scale: Hurts little more Pain Location: generalized, back when boosting in bed. Pain Descriptors / Indicators: Grimacing,  Discomfort Pain Intervention(s): Limited activity within patient's tolerance, Monitored during session    Home Living                          Prior Function            PT Goals (current goals can now be found in the care plan section) Acute Rehab PT Goals Patient Stated Goal: To be able to manage at home alone. PT Goal Formulation: Patient unable to participate in goal setting Time For Goal Achievement: 03/30/24 Potential to Achieve Goals: Fair Progress towards PT goals: Not progressing toward goals - comment    Frequency    Min 2X/week      PT Plan      Co-evaluation              AM-PAC PT 6 Clicks Mobility   Outcome Measure  Help needed turning from your back to your side while in a flat bed without using bedrails?: Total Help needed moving from lying on your back to sitting on the side of a flat bed without using bedrails?: Total Help needed moving to and from a bed to a chair (including a wheelchair)?: Total Help needed standing up from a chair using your arms (e.g., wheelchair or bedside chair)?: Total Help needed to walk in hospital room?: Total Help needed climbing 3-5 steps with a railing? : Total 6 Click Score: 6    End of Session Equipment Utilized During Treatment: Gait belt Activity Tolerance: Patient limited by fatigue Patient left: in bed;with call bell/phone within reach;with bed alarm set Nurse Communication: Mobility status;Need for lift equipment PT Visit Diagnosis: Muscle weakness (generalized) (M62.81);Difficulty in walking, not elsewhere classified (R26.2)     Time: 8980-8963 PT Time Calculation (min) (ACUTE ONLY): 17 min  Charges:    $Therapeutic Activity: 8-22 mins PT General Charges $$ ACUTE PT VISIT: 1 Visit                     [facility] PT Acute Rehabilitation Services Office [phone]    Karsynn Deweese 03/29/2024, 4:23 PM

## 2024-03-29 NOTE — Plan of Care (Signed)
" °  Problem: Clinical Measurements: Goal: Diagnostic test results will improve Outcome: Progressing   Problem: Elimination: Goal: Will not experience complications related to bowel motility Outcome: Progressing   Problem: Pain Managment: Goal: General experience of comfort will improve and/or be controlled Outcome: Progressing   Problem: Safety: Goal: Ability to remain free from injury will improve Outcome: Progressing   Problem: Skin Integrity: Goal: Risk for impaired skin integrity will decrease Outcome: Progressing   "

## 2024-03-30 ENCOUNTER — Other Ambulatory Visit (HOSPITAL_COMMUNITY): Payer: Self-pay

## 2024-03-30 DIAGNOSIS — K6812 Psoas muscle abscess: Secondary | ICD-10-CM | POA: Diagnosis not present

## 2024-03-30 LAB — GLUCOSE, CAPILLARY
Glucose-Capillary: 96 mg/dL (ref 70–99)
Glucose-Capillary: 104 mg/dL — ABNORMAL HIGH (ref 70–99)
Glucose-Capillary: 90 mg/dL (ref 70–99)
Glucose-Capillary: 91 mg/dL (ref 70–99)

## 2024-03-30 LAB — CBC
HCT: 23.3 % — ABNORMAL LOW (ref 39.0–52.0)
Hemoglobin: 7.4 g/dL — ABNORMAL LOW (ref 13.0–17.0)
MCH: 29.6 pg (ref 26.0–34.0)
MCHC: 31.8 g/dL (ref 30.0–36.0)
MCV: 93.2 fL (ref 80.0–100.0)
Platelets: 181 10*3/uL (ref 150–400)
RBC: 2.5 MIL/uL — ABNORMAL LOW (ref 4.22–5.81)
RDW: 19 % — ABNORMAL HIGH (ref 11.5–15.5)
WBC: 3.8 10*3/uL — ABNORMAL LOW (ref 4.0–10.5)
nRBC: 0 % (ref 0.0–0.2)

## 2024-03-30 LAB — BASIC METABOLIC PANEL WITH GFR
Anion gap: 9 (ref 5–15)
BUN: 24 mg/dL — ABNORMAL HIGH (ref 8–23)
CO2: 23 mmol/L (ref 22–32)
Calcium: 8.8 mg/dL — ABNORMAL LOW (ref 8.9–10.3)
Chloride: 118 mmol/L — ABNORMAL HIGH (ref 98–111)
Creatinine, Ser: 2.18 mg/dL — ABNORMAL HIGH (ref 0.61–1.24)
GFR, Estimated: 30 mL/min — ABNORMAL LOW
Glucose, Bld: 113 mg/dL — ABNORMAL HIGH (ref 70–99)
Potassium: 3.6 mmol/L (ref 3.5–5.1)
Sodium: 150 mmol/L — ABNORMAL HIGH (ref 135–145)

## 2024-03-30 LAB — PHOSPHORUS: Phosphorus: 4 mg/dL (ref 2.5–4.6)

## 2024-03-30 LAB — MAGNESIUM: Magnesium: 1.9 mg/dL (ref 1.7–2.4)

## 2024-03-30 MED ORDER — ERTAPENEM IV (FOR PTA / DISCHARGE USE ONLY): 500.0000 mg | INTRAVENOUS | Status: AC

## 2024-03-30 MED ORDER — LINEZOLID 600 MG PO TABS: 600.0000 mg | ORAL_TABLET | Freq: Two times a day (BID) | ORAL | 0 refills | Status: DC

## 2024-03-30 MED ORDER — MELATONIN 3 MG PO TABS
3.0000 mg | ORAL_TABLET | Freq: Every evening | ORAL | Status: DC | PRN
  Administered 2024-03-30: 3 mg via ORAL
  Filled 2024-03-30: qty 1

## 2024-03-30 MED ORDER — ORAL CARE MOUTH RINSE: 15.0000 mL | OROMUCOSAL | Status: DC | PRN

## 2024-03-30 MED ORDER — SODIUM CHLORIDE 0.9 % IV SOLN: 500.0000 mg | INTRAVENOUS | 0 refills | Status: DC

## 2024-03-30 MED ORDER — SODIUM CHLORIDE 0.9 % IV SOLN: 500.0000 mg | INTRAVENOUS | Status: DC

## 2024-03-30 MED ORDER — LINEZOLID 600 MG PO TABS
600.0000 mg | ORAL_TABLET | Freq: Two times a day (BID) | ORAL | 0 refills | Status: DC
  Filled 2024-03-30: qty 6, 3d supply, fill #0

## 2024-03-30 MED ORDER — LINEZOLID 600 MG PO TABS: 600.0000 mg | ORAL_TABLET | Freq: Two times a day (BID) | ORAL | Status: DC

## 2024-03-30 NOTE — Progress Notes (Signed)
 Speech Language Pathology Treatment: Dysphagia  Patient Details Name: Charles Lloyd. MRN: [id number] DOB: 10/24/1940 Today's Date: 03/30/2024 Time: 8681-8661 SLP Time Calculation (min) (ACUTE ONLY): 20 min  Assessment / Plan / Recommendation Clinical Impression  Plan: SLP will continue to follow while admitted. Continue Dys 1 (puree), thin liquids.   Patient seen by SLP for skilled treatment focused on dysphagia goals. He was awake, alert and cooperative. Trace amounts of likely PO residuals observed and SLP provided total assist with oral care. Patient able to purse lips slightly in attempt to spit but does not have the strength. When SLP feeding him cup sips of water, he did attempt to bring right hand up close to cup. Anterior spillage was mitigated by using previous SLP's strategy (posted at [facility]) to use towel to press up against lips to aid in lip seal on cup. No overt s/s aspiration but oral transit delayed and suspected delay in swallow initiation.    HPI HPI: Beatriz Settles. is a 84 y.o. male who presented to the emergency department on 02/22/2024 for leg pain and weakness, with family reporting multiple falls over the past 6 to 8 months.  MRI lumbar spine revealed a new 3.5 x 2.4 cm fluid collection in the left psoas muscle concerning for abscess.  Neurosurgery was consulted at that time and recommended IV antibiotics and possible aspiration of abscess. BSE 03/10/24: NPO, Dys1/thin 03/11/24, Dys3/thin 03/13/24. Dys2/thin 03/18/24. PMH HTN, hyperlipidemia, DM2, atrial fibrillation on anticoagulation, s/p recent L4-5 trans psoas lumbar interbody fusion left-sided approach on 02/06/2024, CHF,gout,CAD, TKA with infection, neuropathy      SLP Plan  Continue with current plan of care        Swallow Evaluation Recommendations         Recommendations  Diet recommendations: Thin liquid;Dysphagia 1 (puree) Liquids provided via: Cup Medication Administration: Crushed with  puree Compensations: Minimize environmental distractions;Slow rate;Small sips/bites;Monitor for anterior loss Postural Changes and/or Swallow Maneuvers: Seated upright 90 degrees;Upright 30-60 min after meal                  Oral care before and after PO   Frequent or constant Supervision/Assistance Dysphagia, oropharyngeal phase (R13.12)     Continue with current plan of care     Norleen IVAR Blase, MA, CCC-SLP Speech Therapy   03/30/2024, 1:45 PM

## 2024-03-30 NOTE — TOC Progression Note (Addendum)
 Transition of Care (TOC) - Progression Note    Patient Details  Name: Charles Lloyd. MRN: [id number] Date of Birth: [birth year]  Transition of Care [facility]) CM/SW Contact  [name], RN Phone [phone]  03/30/2024, 10:15 AM  Clinical Narrative:    CM followed up with [name] hospital liaison for [name]. [name] is checking to make sure facility still has a bed available. Will await return call  [phone] Per [name] pharmacy at [name] is closed today, [name] inquiring of pharmacy can send patient with 3 days supply of meds. Per [name] Ertapenem  has to be made in our IV room and it expires in 24 hrs after it's made. The most they can give the patient is 1 dose and that only lasts for 24 hours. Per MD we will need to wait until we are sure the pharmacy is open and can provide this medication for the patient. Liasion for [name] is aware and will follow up in the am.    Expected Discharge Plan: Skilled Nursing Facility Barriers to Discharge: Barriers Resolved               Expected Discharge Plan and Services In-house Referral: NA Discharge Planning Services: CM Consult Post Acute Care Choice: Skilled Nursing Facility Living arrangements for the past 2 months: Single Family Home Expected Discharge Date: 03/30/24                                     Social Drivers of Health (SDOH) Interventions SDOH Screenings   Food Insecurity: No Food Insecurity (02/23/2024)  Housing: Low Risk (02/23/2024)  Transportation Needs: No Transportation Needs (02/23/2024)  Utilities: Not At Risk (02/23/2024)  Alcohol  Screen: Low Risk (12/21/2022)  Depression (PHQ2-9): Low Risk (02/11/2024)  Financial Resource Strain: Low Risk (12/21/2022)  Physical Activity: Inactive (12/21/2022)  Social Connections: Socially Isolated (02/23/2024)  Stress: No Stress Concern Present (12/21/2022)  Tobacco Use: Low Risk  (02/22/2024)  Health Literacy: Adequate Health Literacy (12/21/2022)    Readmission Risk Interventions    02/26/2024    2:28 PM 08/08/2021   10:00 AM  Readmission Risk Prevention Plan  Transportation Screening Complete Complete  PCP or Specialist Appt within 3-5 Days Complete   HRI or Home Care Consult Complete   Social Work Consult for Recovery Care Planning/Counseling Complete   Palliative Care Screening Not Applicable   Medication Review Oceanographer) Complete Complete  PCP or Specialist appointment within 3-5 days of discharge  Complete  HRI or Home Care Consult  Complete  SW Recovery Care/Counseling Consult  Not Complete  Palliative Care Screening  Not Applicable  Skilled Nursing Facility  Not Applicable

## 2024-03-30 NOTE — Discharge Instructions (Signed)
 Advised to follow-up with primary care physician in 1 week. Advised to check CBC and BMP in 3 days. Advised to continue linezolid  and ertapenem  until 04/11/2023.

## 2024-03-31 LAB — GLUCOSE, CAPILLARY
Glucose-Capillary: 70 mg/dL (ref 70–99)
Glucose-Capillary: 83 mg/dL (ref 70–99)

## 2024-03-31 MED ORDER — HYDROXYZINE HCL 25 MG PO TABS
25.0000 mg | ORAL_TABLET | Freq: Once | ORAL | Status: AC
  Administered 2024-03-31: 25 mg via ORAL
  Filled 2024-03-31: qty 1

## 2024-03-31 NOTE — Care Management Important Message (Signed)
 Important Message  Patient Details IM Letter given. Name: Charles Lloyd. MRN: [id number] Date of Birth: 13-Jun-1940   Important Message Given:  Yes - Medicare IM     Ashawna Hanback 03/31/2024, 10:43 AM

## 2024-03-31 NOTE — Progress Notes (Signed)
 Called report to Marvene Chang ,LPN at [facility]

## 2024-03-31 NOTE — Plan of Care (Signed)
  Problem: Clinical Measurements: Goal: Ability to maintain clinical measurements within normal limits will improve Outcome: Progressing Goal: Will remain free from infection Outcome: Progressing Goal: Diagnostic test results will improve Outcome: Progressing   Problem: Elimination: Goal: Will not experience complications related to urinary retention Outcome: Progressing

## 2024-03-31 NOTE — TOC Transition Note (Signed)
 Transition of Care [facility]) - Discharge Note   Patient Details  Name: Charles Lloyd. MRN: [id number] Date of Birth: 07-Apr-1940  Transition of Care [facility]) CM/SW Contact:  [name], RN Phone [phone]  03/31/2024, 10:12 AM   Clinical Narrative:    Patient discharging to [facility]. Transportation has been arranged per [facility]. Son Cono has been updated. RN has been given info for report. No other needs noted. CM will sign off.    Final next level of care: Skilled Nursing Facility Barriers to Discharge: No Barriers Identified   Patient Goals and CMS Choice Patient states their goals for this hospitalization and ongoing recovery are:: SNF to get stonger CMS Medicare.gov Compare Post Acute Care list provided to:: Patient Represenative (must comment) ([place]oda(Son)) Choice offered to / list presented to : Adult Children [place] ownership interest in [facility].provided to:: Adult Children    Discharge Placement              Patient chooses bed at:  [facility]) Patient to be transferred to facility by: [facility] Name of family member notified: Petr (son) [phone] Patient and family notified of of transfer: 03/31/24  Discharge Plan and Services Additional resources added to the After Visit Summary for   In-house Referral: NA Discharge Planning Services: CM Consult Post Acute Care Choice: Skilled Nursing Facility            DME Agency: NA       HH Arranged: NA HH Agency: NA        Social Drivers of Health (SDOH) Interventions SDOH Screenings   Food Insecurity: No Food Insecurity (02/23/2024)  Housing: Low Risk (02/23/2024)  Transportation Needs: No Transportation Needs (02/23/2024)  Utilities: Not At Risk (02/23/2024)  Alcohol  Screen: Low Risk (12/21/2022)  Depression (PHQ2-9): Low Risk (02/11/2024)  Financial Resource Strain: Low Risk (12/21/2022)  Physical Activity: Inactive (12/21/2022)  Social  Connections: Socially Isolated (02/23/2024)  Stress: No Stress Concern Present (12/21/2022)  Tobacco Use: Low Risk (02/22/2024)  Health Literacy: Adequate Health Literacy (12/21/2022)     Readmission Risk Interventions    02/26/2024    2:28 PM 08/08/2021   10:00 AM  Readmission Risk Prevention Plan  Transportation Screening Complete Complete  PCP or Specialist Appt within 3-5 Days Complete   HRI or Home Care Consult Complete   Social Work Consult for Recovery Care Planning/Counseling Complete   Palliative Care Screening Not Applicable   Medication Review Oceanographer) Complete Complete  PCP or Specialist appointment within 3-5 days of discharge  Complete  HRI or Home Care Consult  Complete  SW Recovery Care/Counseling Consult  Not Complete  Palliative Care Screening  Not Applicable  Skilled Nursing Facility  Not Applicable

## 2024-04-02 ENCOUNTER — Telehealth: Payer: Self-pay

## 2024-04-02 NOTE — Telephone Encounter (Signed)
 [name],NP at [facility] center called stating that patient is running a low grade fever of 100.2-100.4 since discharge from hospital on 2/3 - slightly hypoxic 88-91 on 2-3L of O2. CXR is clear. On IV ertapenem  and linezolid . Please advise.  Call back is [phone]

## 2024-04-02 NOTE — Telephone Encounter (Signed)
 Called [name],NP informed her of Dr.Vu recommendations. NP stated that he's been kind of sedated since being admitted at [facility] on 2/3. NP didn't see a rash, unsure if has altered mental status. Had labs done today CBC w/diff and CMP. Will check CPK and fax to our office. Had a chest x-ray it was normal. Will keep an eye on temp. Scheduler will reach out to our office to get patient scheduled. NP aware if symptoms worsen patient will need to go to ER.  Charles Lloyd Charles Lloyd, CMA
